# Patient Record
Sex: Male | Born: 1960 | Race: White | Hispanic: No | Smoking: Former smoker
Health system: Southern US, Community
[De-identification: ages and names within clinical notes are randomized; demographics above are authoritative.]

## PROBLEM LIST (undated history)

## (undated) DIAGNOSIS — J45909 Unspecified asthma, uncomplicated: Secondary | ICD-10-CM

## (undated) DIAGNOSIS — E785 Hyperlipidemia, unspecified: Secondary | ICD-10-CM

## (undated) DIAGNOSIS — I739 Peripheral vascular disease, unspecified: Secondary | ICD-10-CM

## (undated) DIAGNOSIS — C3490 Malignant neoplasm of unspecified part of unspecified bronchus or lung: Secondary | ICD-10-CM

## (undated) DIAGNOSIS — K219 Gastro-esophageal reflux disease without esophagitis: Secondary | ICD-10-CM

## (undated) DIAGNOSIS — J449 Chronic obstructive pulmonary disease, unspecified: Secondary | ICD-10-CM

## (undated) DIAGNOSIS — G40909 Epilepsy, unspecified, not intractable, without status epilepticus: Secondary | ICD-10-CM

## (undated) DIAGNOSIS — I10 Essential (primary) hypertension: Secondary | ICD-10-CM

## (undated) DIAGNOSIS — Z9889 Other specified postprocedural states: Secondary | ICD-10-CM

## (undated) HISTORY — DX: Chronic obstructive pulmonary disease, unspecified: J44.9

## (undated) HISTORY — DX: Epilepsy, unspecified, not intractable, without status epilepticus: G40.909

## (undated) HISTORY — DX: Unspecified asthma, uncomplicated: J45.909

---

## 2002-10-15 ENCOUNTER — Encounter: Admission: RE | Admit: 2002-10-15 | Discharge: 2002-10-15 | Payer: Self-pay | Admitting: Internal Medicine

## 2015-12-17 ENCOUNTER — Encounter (HOSPITAL_COMMUNITY): Payer: Self-pay | Admitting: Emergency Medicine

## 2015-12-17 ENCOUNTER — Emergency Department (HOSPITAL_COMMUNITY)
Admission: EM | Admit: 2015-12-17 | Discharge: 2015-12-17 | Discharge status: Against Medical Advice | Payer: Medicaid Other | Attending: Emergency Medicine | Admitting: Emergency Medicine

## 2015-12-17 DIAGNOSIS — M79602 Pain in left arm: Secondary | ICD-10-CM | POA: Diagnosis not present

## 2015-12-17 DIAGNOSIS — M25512 Pain in left shoulder: Secondary | ICD-10-CM | POA: Insufficient documentation

## 2015-12-17 DIAGNOSIS — I10 Essential (primary) hypertension: Secondary | ICD-10-CM | POA: Diagnosis not present

## 2015-12-17 DIAGNOSIS — M79601 Pain in right arm: Secondary | ICD-10-CM | POA: Diagnosis not present

## 2015-12-17 DIAGNOSIS — F17200 Nicotine dependence, unspecified, uncomplicated: Secondary | ICD-10-CM | POA: Diagnosis not present

## 2015-12-17 DIAGNOSIS — M25511 Pain in right shoulder: Secondary | ICD-10-CM | POA: Diagnosis not present

## 2015-12-17 HISTORY — DX: Essential (primary) hypertension: I10

## 2015-12-17 NOTE — ED Notes (Signed)
Pt. reports bilateral shoulders and both arm pain onset this evening , denies injury .

## 2021-08-18 ENCOUNTER — Other Ambulatory Visit: Payer: Self-pay

## 2021-08-18 ENCOUNTER — Emergency Department (HOSPITAL_COMMUNITY): Payer: Medicaid Other

## 2021-08-18 ENCOUNTER — Encounter (HOSPITAL_COMMUNITY): Payer: Self-pay

## 2021-08-18 ENCOUNTER — Inpatient Hospital Stay (HOSPITAL_COMMUNITY)
Admission: EM | Admit: 2021-08-18 | Discharge: 2021-08-21 | DRG: 065 | Disposition: A | Discharge status: Home Health | Payer: Medicaid Other | Attending: Internal Medicine | Admitting: Internal Medicine

## 2021-08-18 DIAGNOSIS — Z8673 Personal history of transient ischemic attack (TIA), and cerebral infarction without residual deficits: Secondary | ICD-10-CM | POA: Diagnosis present

## 2021-08-18 DIAGNOSIS — Z7982 Long term (current) use of aspirin: Secondary | ICD-10-CM

## 2021-08-18 DIAGNOSIS — I63512 Cerebral infarction due to unspecified occlusion or stenosis of left middle cerebral artery: Principal | ICD-10-CM | POA: Diagnosis present

## 2021-08-18 DIAGNOSIS — F17210 Nicotine dependence, cigarettes, uncomplicated: Secondary | ICD-10-CM | POA: Diagnosis present

## 2021-08-18 DIAGNOSIS — I110 Hypertensive heart disease with heart failure: Secondary | ICD-10-CM | POA: Diagnosis present

## 2021-08-18 DIAGNOSIS — I5042 Chronic combined systolic (congestive) and diastolic (congestive) heart failure: Secondary | ICD-10-CM | POA: Diagnosis present

## 2021-08-18 DIAGNOSIS — I6502 Occlusion and stenosis of left vertebral artery: Secondary | ICD-10-CM | POA: Diagnosis present

## 2021-08-18 DIAGNOSIS — R29704 NIHSS score 4: Secondary | ICD-10-CM | POA: Diagnosis present

## 2021-08-18 DIAGNOSIS — F17200 Nicotine dependence, unspecified, uncomplicated: Secondary | ICD-10-CM | POA: Diagnosis present

## 2021-08-18 DIAGNOSIS — R4701 Aphasia: Secondary | ICD-10-CM | POA: Diagnosis present

## 2021-08-18 DIAGNOSIS — R233 Spontaneous ecchymoses: Secondary | ICD-10-CM | POA: Diagnosis present

## 2021-08-18 DIAGNOSIS — I10 Essential (primary) hypertension: Secondary | ICD-10-CM | POA: Diagnosis present

## 2021-08-18 DIAGNOSIS — R270 Ataxia, unspecified: Secondary | ICD-10-CM | POA: Diagnosis present

## 2021-08-18 DIAGNOSIS — Z8249 Family history of ischemic heart disease and other diseases of the circulatory system: Secondary | ICD-10-CM

## 2021-08-18 DIAGNOSIS — Z28310 Unvaccinated for covid-19: Secondary | ICD-10-CM

## 2021-08-18 DIAGNOSIS — R471 Dysarthria and anarthria: Secondary | ICD-10-CM | POA: Diagnosis present

## 2021-08-18 DIAGNOSIS — G8321 Monoplegia of upper limb affecting right dominant side: Secondary | ICD-10-CM | POA: Diagnosis present

## 2021-08-18 DIAGNOSIS — I358 Other nonrheumatic aortic valve disorders: Secondary | ICD-10-CM | POA: Diagnosis present

## 2021-08-18 DIAGNOSIS — I639 Cerebral infarction, unspecified: Secondary | ICD-10-CM | POA: Diagnosis present

## 2021-08-18 DIAGNOSIS — Z20822 Contact with and (suspected) exposure to covid-19: Secondary | ICD-10-CM | POA: Diagnosis present

## 2021-08-18 LAB — DIFFERENTIAL
Abs Immature Granulocytes: 0.03 10*3/uL (ref 0.00–0.07)
Basophils Absolute: 0 10*3/uL (ref 0.0–0.1)
Basophils Relative: 1 %
Eosinophils Absolute: 0.1 10*3/uL (ref 0.0–0.5)
Eosinophils Relative: 2 %
Immature Granulocytes: 0 %
Lymphocytes Relative: 23 %
Lymphs Abs: 2.1 10*3/uL (ref 0.7–4.0)
Monocytes Absolute: 0.6 10*3/uL (ref 0.1–1.0)
Monocytes Relative: 7 %
Neutro Abs: 5.9 10*3/uL (ref 1.7–7.7)
Neutrophils Relative %: 67 %

## 2021-08-18 LAB — COMPREHENSIVE METABOLIC PANEL
ALT: 13 U/L (ref 0–44)
AST: 15 U/L (ref 15–41)
Albumin: 3.8 g/dL (ref 3.5–5.0)
Alkaline Phosphatase: 86 U/L (ref 38–126)
Anion gap: 12 (ref 5–15)
BUN: 13 mg/dL (ref 8–23)
CO2: 27 mmol/L (ref 22–32)
Calcium: 9.7 mg/dL (ref 8.9–10.3)
Chloride: 106 mmol/L (ref 98–111)
Creatinine, Ser: 1.03 mg/dL (ref 0.61–1.24)
GFR, Estimated: >60 mL/min (ref >60)
Glucose, Bld: 115 mg/dL — ABNORMAL HIGH (ref 70–99)
Potassium: 4.5 mmol/L (ref 3.5–5.1)
Sodium: 145 mmol/L (ref 135–145)
Total Bilirubin: 0.6 mg/dL (ref 0.3–1.2)
Total Protein: 7.4 g/dL (ref 6.5–8.1)

## 2021-08-18 LAB — RESP PANEL BY RT-PCR (FLU A&B, COVID) ARPGX2
Influenza A by PCR: NEGATIVE
Influenza B by PCR: NEGATIVE
SARS Coronavirus 2 by RT PCR: NEGATIVE

## 2021-08-18 LAB — APTT: aPTT: 31 seconds (ref 24–36)

## 2021-08-18 LAB — PROTIME-INR
INR: 1 (ref 0.8–1.2)
Prothrombin Time: 13 seconds (ref 11.4–15.2)

## 2021-08-18 LAB — CBC
HCT: 54.8 % — ABNORMAL HIGH (ref 39.0–52.0)
Hemoglobin: 18.2 g/dL — ABNORMAL HIGH (ref 13.0–17.0)
MCH: 30.8 pg (ref 26.0–34.0)
MCHC: 33.2 g/dL (ref 30.0–36.0)
MCV: 92.9 fL (ref 80.0–100.0)
Platelets: 250 10*3/uL (ref 150–400)
RBC: 5.9 MIL/uL — ABNORMAL HIGH (ref 4.22–5.81)
RDW: 14.9 % (ref 11.5–15.5)
WBC: 8.8 10*3/uL (ref 4.0–10.5)
nRBC: 0 % (ref 0.0–0.2)

## 2021-08-18 LAB — I-STAT CHEM 8, ED
BUN: 13 mg/dL (ref 8–23)
Calcium, Ion: 1.16 mmol/L (ref 1.15–1.40)
Chloride: 103 mmol/L (ref 98–111)
Creatinine, Ser: 1 mg/dL (ref 0.61–1.24)
Glucose, Bld: 118 mg/dL — ABNORMAL HIGH (ref 70–99)
HCT: 55 % — ABNORMAL HIGH (ref 39.0–52.0)
Hemoglobin: 18.7 g/dL — ABNORMAL HIGH (ref 13.0–17.0)
Potassium: 4.3 mmol/L (ref 3.5–5.1)
Sodium: 141 mmol/L (ref 135–145)
TCO2: 27 mmol/L (ref 22–32)

## 2021-08-18 LAB — ETHANOL: Alcohol, Ethyl (B): <10 mg/dL (ref <10)

## 2021-08-18 IMAGING — CT CT HEAD W/O CM
3 series · 15 of 47 positions shown, 18 images · non-contrast
Comparison: None.

CLINICAL DATA: Trouble speaking right-sided weakness

EXAM:
CT HEAD WITHOUT CONTRAST
TECHNIQUE: Contiguous axial images were obtained from the base of the skull
through the vertex without intravenous contrast.

[Series 3: head wo · axial · 0.47mm/px · z∈[-147,-2]mm · 9 of 35 slices shown, 12 images]
[im 3/35  brain]
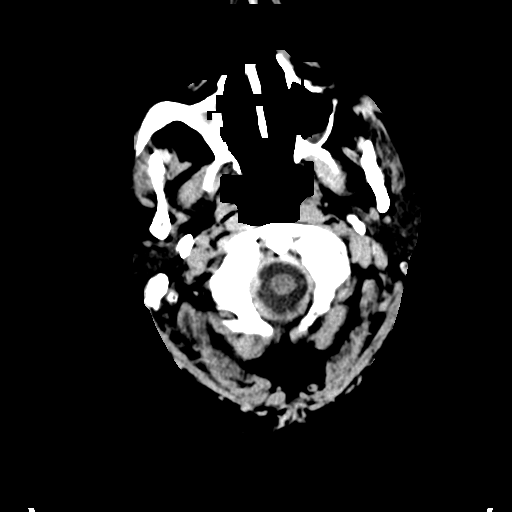
[im 3/35  bone]
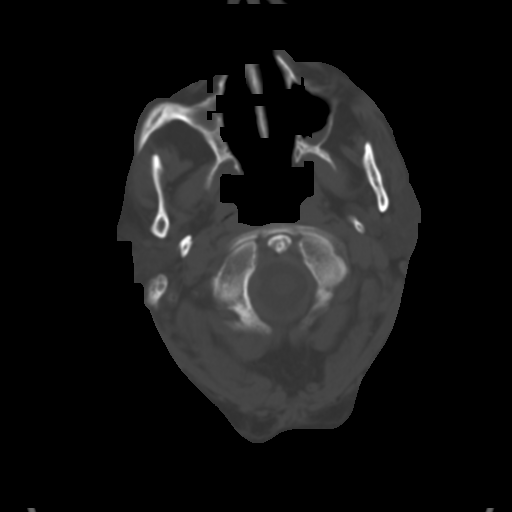
[im 6/35  brain]
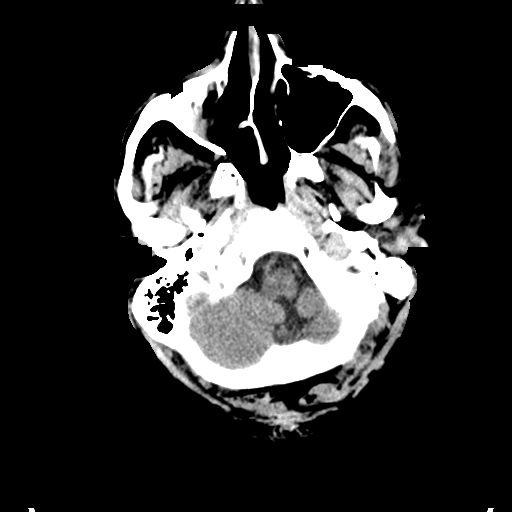
[im 10/35  brain]
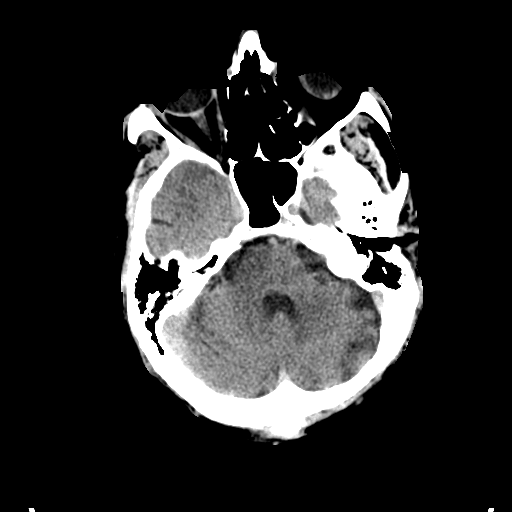
[im 13/35  brain]
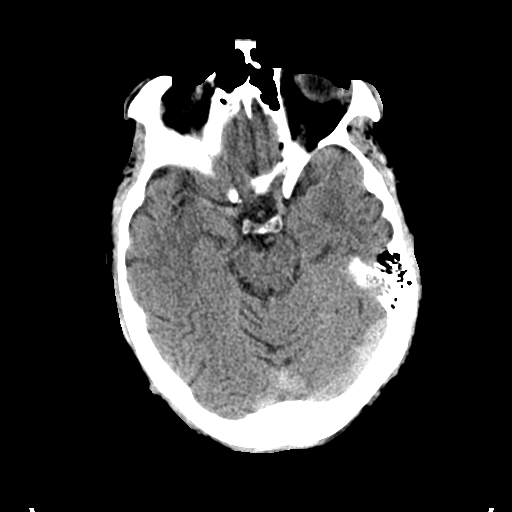
[im 18/35  brain]
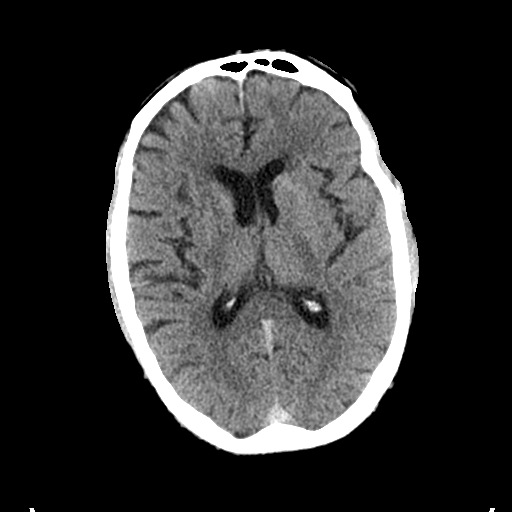
[im 18/35  bone]
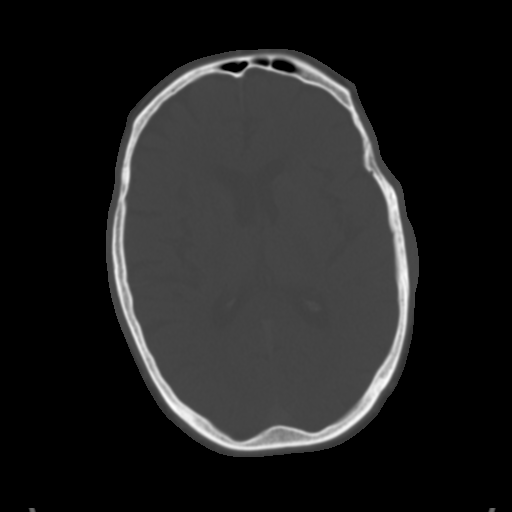
[im 22/35  brain]
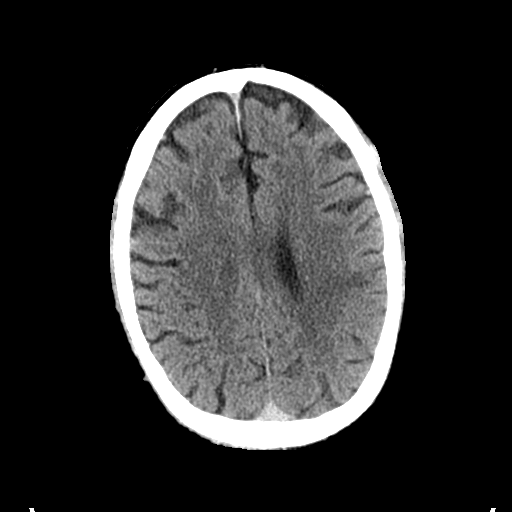
[im 25/35  brain]
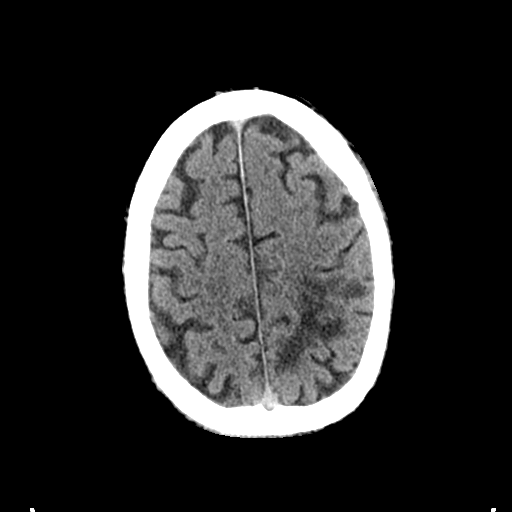
[im 29/35  brain]
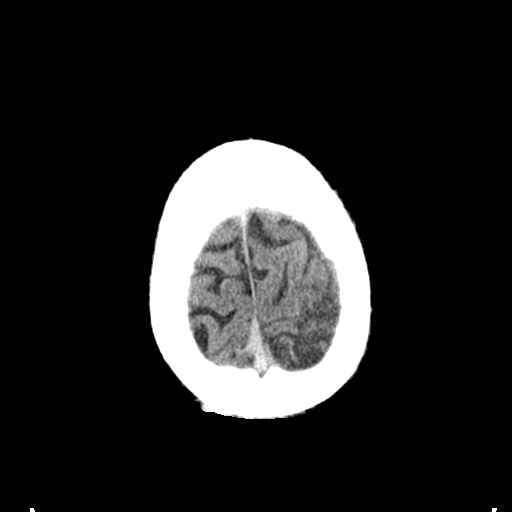
[im 32/35  brain]
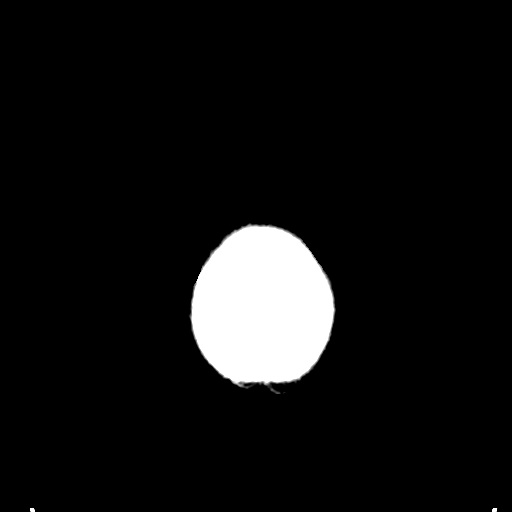
[im 32/35  bone]
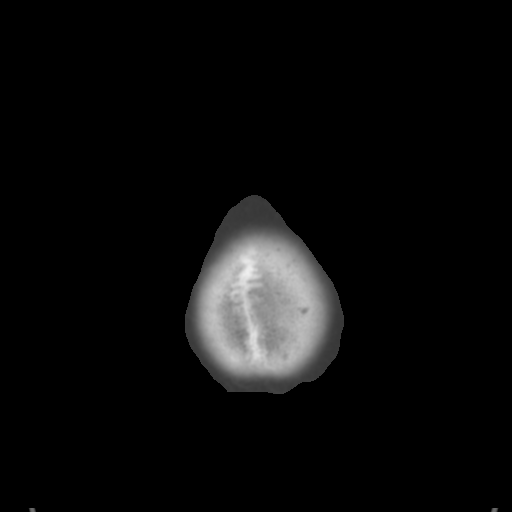

[Series 4: coronal soft tissue · coronal · 0.35mm/px · 3 of 83 slices shown]
[im 28/83  brain]
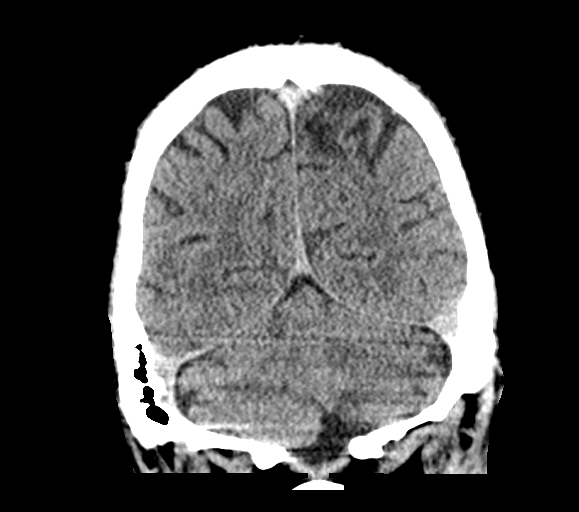
[im 37/83  brain]
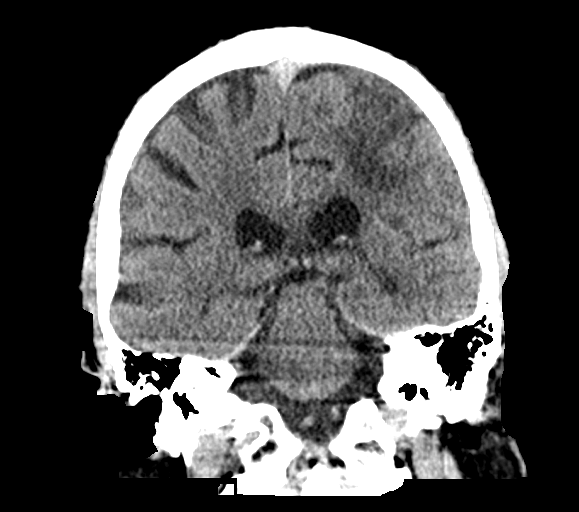
[im 46/83  brain]
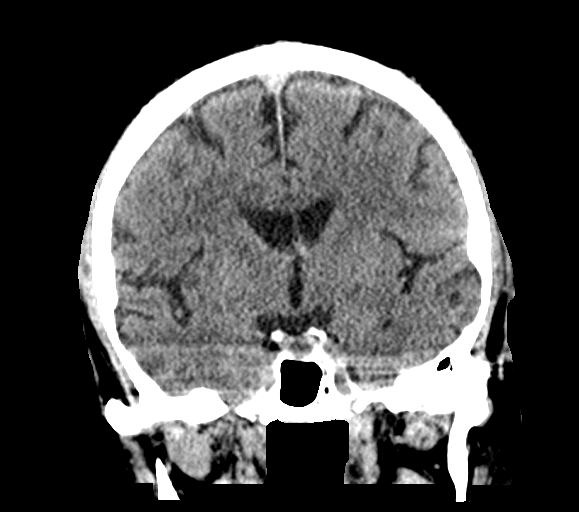

[Series 5: sagittal soft tissue · sagittal · 0.35mm/px · 3 of 68 slices shown]
[im 23/68  brain]
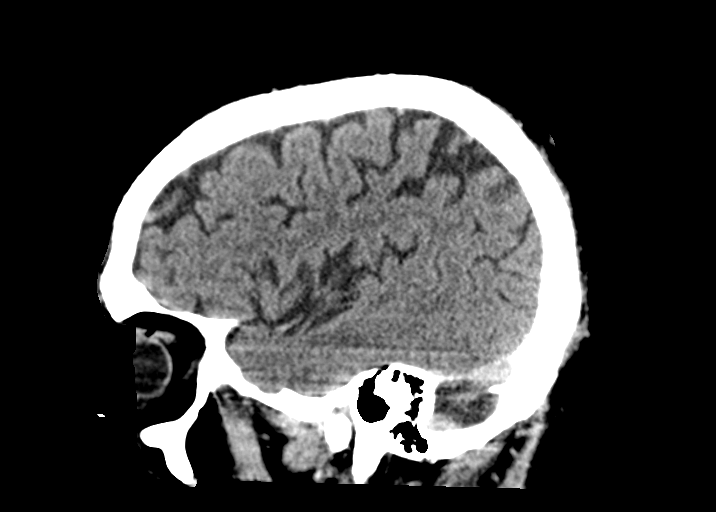
[im 34/68  brain]
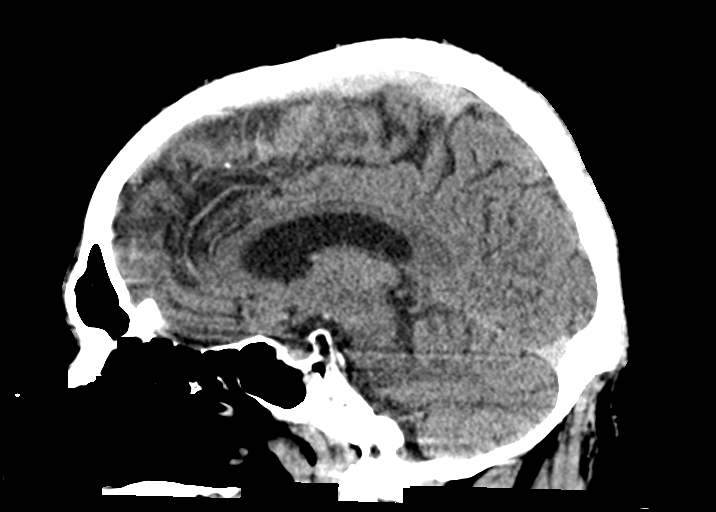
[im 45/68  brain]
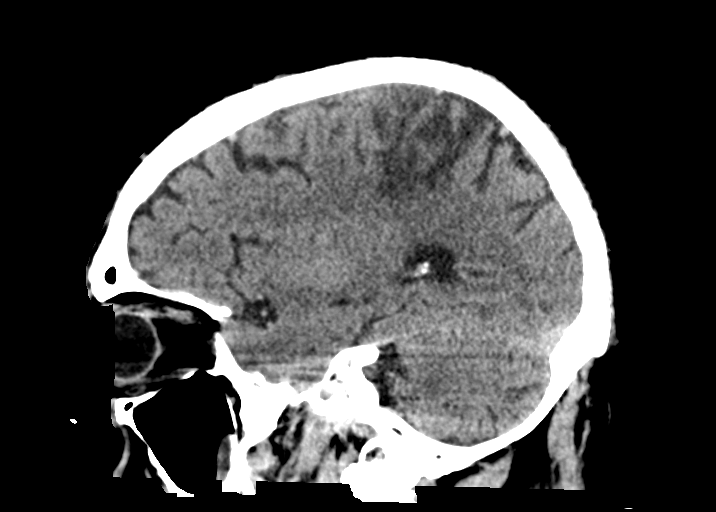

[15 of 47 positions shown; findings below may reference images not displayed]

FINDINGS: Brain: No hemorrhage or intracranial is visualized. Hypodensity
within the left parietal lobe and left cerebellum. Mild atrophy.
Patchy white matter hypodensity consistent with chronic small vessel
ischemic change.

Vascular: Prominent vessel at the anterior left lateral ventricle
could be related to developmental venous anomaly.

Skull: No fracture

Sinuses/Orbits: Small fluid in the left greater than right mastoid.
Opacified right maxillary sinus

Other: None
IMPRESSION: 1. Moderate hypodensity within the left parietal lobe with moderate
hypodensity in the left cerebellum, consistent with age
indeterminate infarcts; appearance suggests subacute to developing
chronic infarcts though given history, correlation with MRI should
be considered. No hemorrhage identified
2. Atrophy with mild chronic small vessel ischemic changes of the
white matter

## 2021-08-18 IMAGING — CT CT ANGIO NECK
2 of 7 series · 8 of 33 positions shown · IV contrast (omnipaque)
Comparison: None.

CLINICAL DATA: Right upper extremity weakness

EXAM:
CT ANGIOGRAPHY HEAD AND NECK
TECHNIQUE: Multidetector CT imaging of the head and neck was performed using
the standard protocol during bolus administration of intravenous
contrast. Multiplanar CT image reconstructions and MIPs were
obtained to evaluate the vascular anatomy. Carotid stenosis
measurements (when applicable) are obtained utilizing NASCET
criteria, using the distal internal carotid diameter as the
denominator.
CONTRAST:  75mL OMNIPAQUE IOHEXOL 350 MG/ML SOLN

[Series 6: cta head neck · axial · 0.61mm/px · z∈[-271,-139]mm · 2 of 200 slices shown]
[im 67/200  soft-tissue]
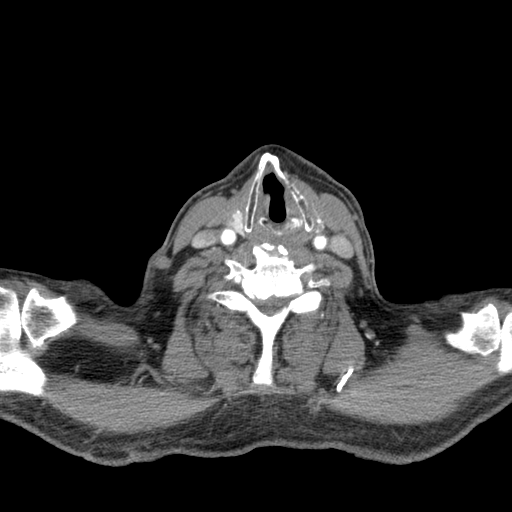
[im 133/200  soft-tissue]
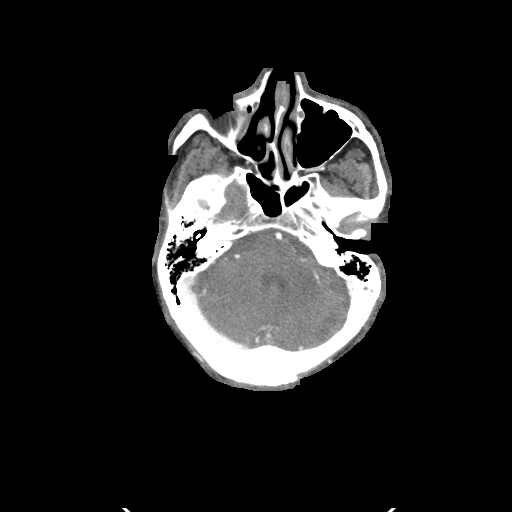

[Series 8: ax thin · axial · 0.39mm/px · z∈[-346,-62]mm · 6 of 396 slices shown]
[im 57/396  soft-tissue]
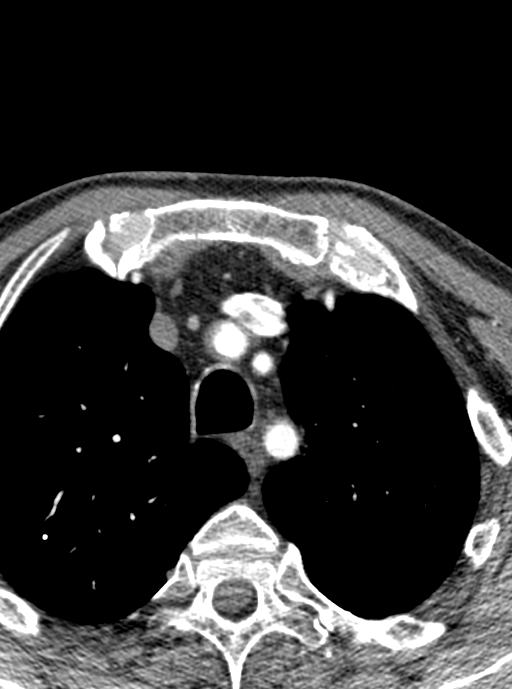
[im 113/396  bone]
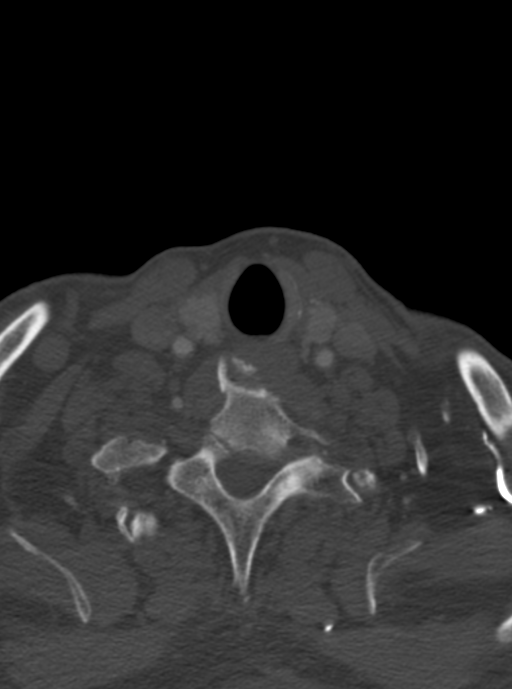
[im 170/396  soft-tissue]
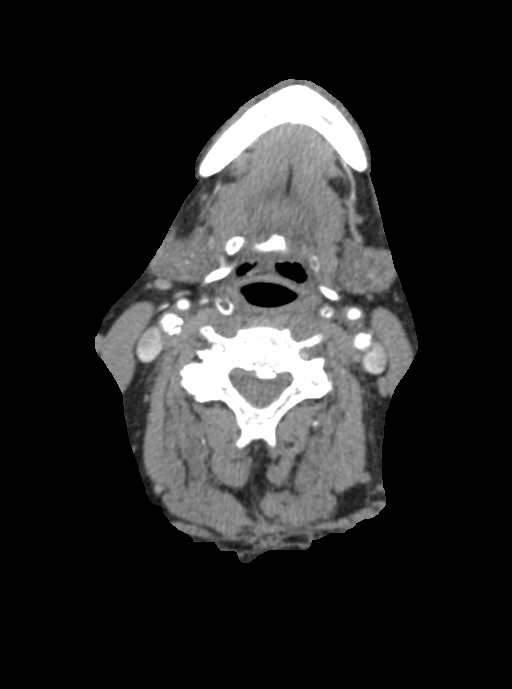
[im 226/396  bone]
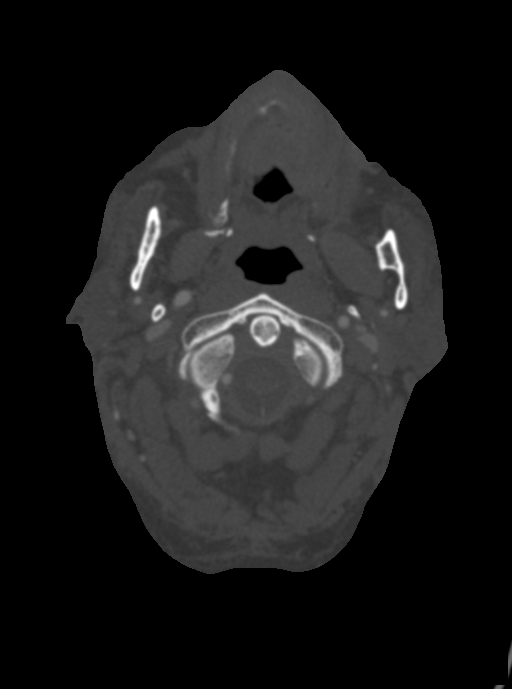
[im 283/396  soft-tissue]
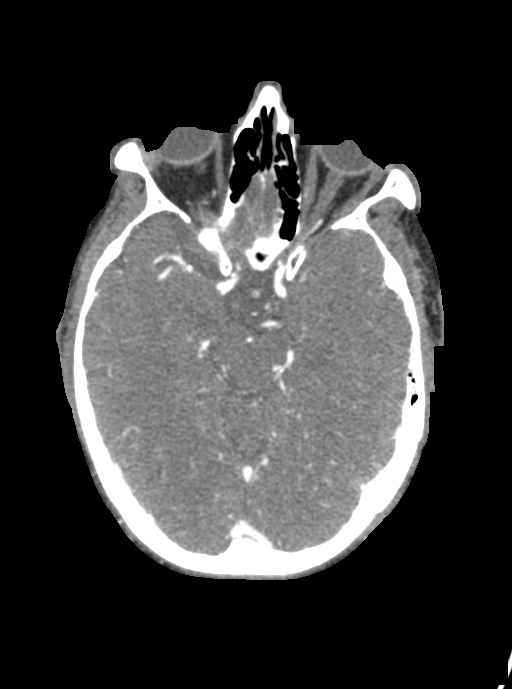
[im 339/396  bone]
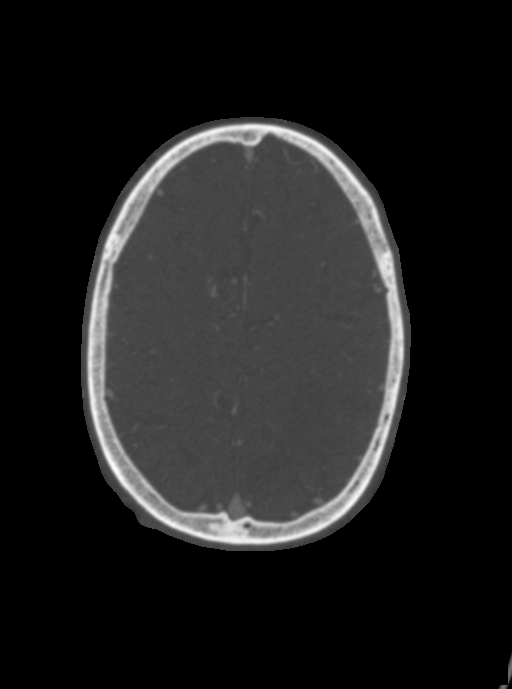

[8 of 33 positions shown; findings below may reference images not displayed]

FINDINGS: CTA NECK FINDINGS

SKELETON: There is no bony spinal canal stenosis. No lytic or
blastic lesion.

OTHER NECK: Normal pharynx, larynx and major salivary glands. No
cervical lymphadenopathy. Unremarkable thyroid gland.

UPPER CHEST: Emphysema

AORTIC ARCH:

There is calcific atherosclerosis of the aortic arch. There is no
aneurysm, dissection or hemodynamically significant stenosis of the
visualized portion of the aorta. Conventional 3 vessel aortic
branching pattern. The visualized proximal subclavian arteries are
widely patent.

RIGHT CAROTID SYSTEM: Normal without aneurysm, dissection or
stenosis.

LEFT CAROTID SYSTEM: Normal without aneurysm, dissection or
stenosis.

VERTEBRAL ARTERIES: Right dominant configuration. The left vertebral
artery is occluded along its entire length. Right vertebral artery
is normal.

CTA HEAD FINDINGS

POSTERIOR CIRCULATION:

--Vertebral arteries: Normal V4 segments.

--Inferior cerebellar arteries: Normal.

--Basilar artery: Normal.

--Superior cerebellar arteries: Normal.

--Posterior cerebral arteries (PCA): Normal.

ANTERIOR CIRCULATION:

--Intracranial internal carotid arteries: Low-density plaque within
the clinoid segment of the left ICA causing severe stenosis. There
is mild bilateral calcification.

--Anterior cerebral arteries (ACA): Normal. Both A1 segments are
present. Patent anterior communicating artery (a-comm).

--Middle cerebral arteries (MCA): Normal.

VENOUS SINUSES: As permitted by contrast timing, patent.

ANATOMIC VARIANTS: Large left periventricular developmental venous
anomaly that drains into the left internal cerebral vein.

Review of the MIP images confirms the above findings.
IMPRESSION: 1. Severe stenosis of the clinoid segment of the left internal
carotid artery due to low-density plaque. This could provide a
source of emboli into the left MCA.
2. Occlusion of the left vertebral artery along its entire length,
age indeterminate.
3. Large left periventricular developmental venous anomaly that
drains into the left internal cerebral vein.

Aortic Atherosclerosis ([0Z]-[0Z]) and Emphysema ([0Z]-[0Z]).

## 2021-08-18 IMAGING — MR MR HEAD W/O CM
11 series · 48 of 48 positions shown · non-contrast
Comparison: None.

CLINICAL DATA: Right upper extremity weakness with slurred speech

EXAM:
MRI HEAD WITHOUT CONTRAST
TECHNIQUE: Multiplanar, multiecho pulse sequences of the brain and surrounding
structures were obtained without intravenous contrast.

[Series 5: DWI · axial · 3.0mm · 1.36mm/px · z∈[-74,+63]mm · 7 of 104 slices shown (1 of 2)]
[im 1/104]
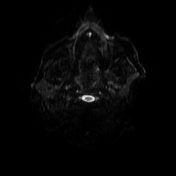
[im 18/104]
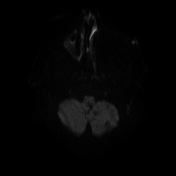
[im 35/104]
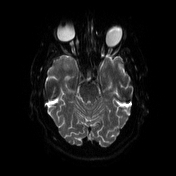
[im 52/104]
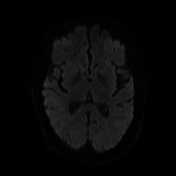
[im 69/104]
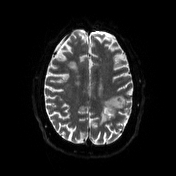
[im 86/104]
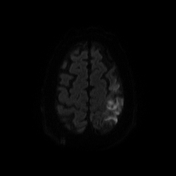
[im 104/104]
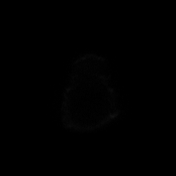

[Series 6: DWI · axial · 3.0mm · 1.36mm/px · z∈[-74,+63]mm · 4 of 52 slices shown (2 of 2)]
[im 1/52]
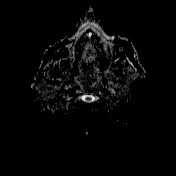
[im 18/52]
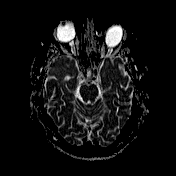
[im 35/52]
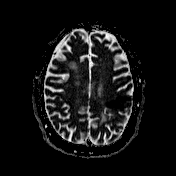
[im 52/52]
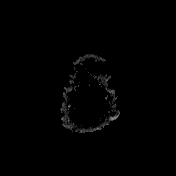

[Series 7: T1 · sagittal · 5.0mm · 0.75mm/px · 2 of 24 slices shown (1 of 2)]
[im 1/24]
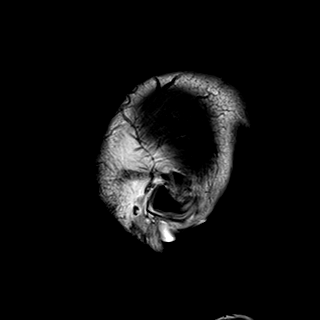
[im 24/24]
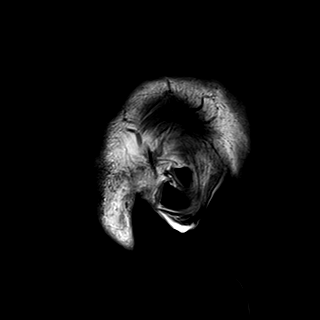

[Series 8: T2 · axial · 5.0mm · 0.62mm/px · z∈[-74,+72]mm · 2 of 26 slices shown (1 of 2)]
[im 1/26]
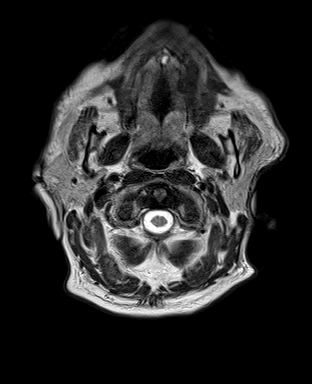
[im 26/26]
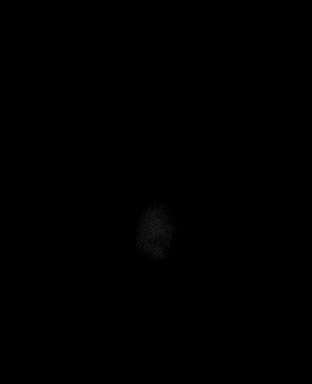

[Series 9: swi_images · axial · 3.0mm · 0.75mm/px · z∈[-75,+73]mm · 4 of 56 slices shown]
[im 1/56]
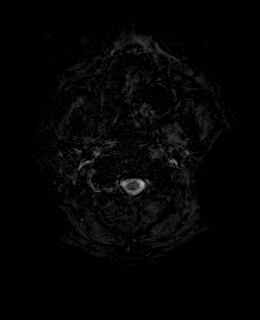
[im 19/56]
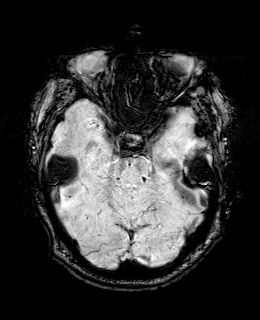
[im 37/56]
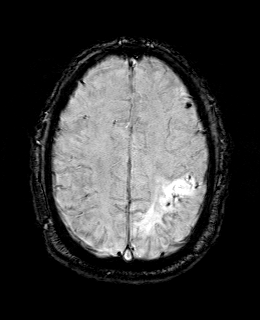
[im 56/56]
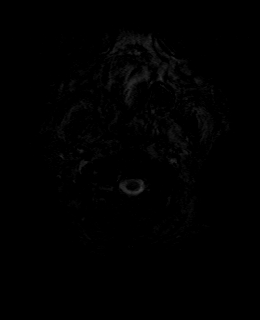

[Series 10: mip_images(sw) · axial · 24.0mm · 0.75mm/px · z∈[-66,+64]mm · 4 of 49 slices shown]
[im 1/49]
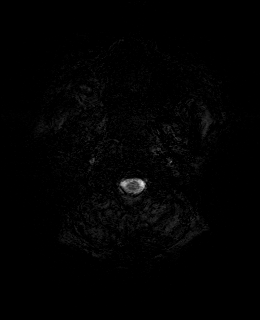
[im 17/49]
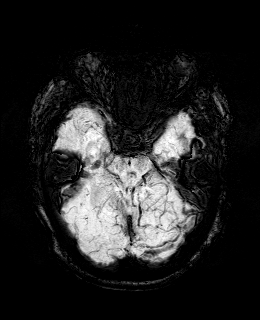
[im 33/49]
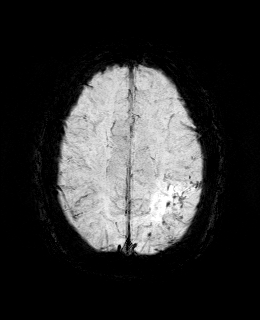
[im 49/49]
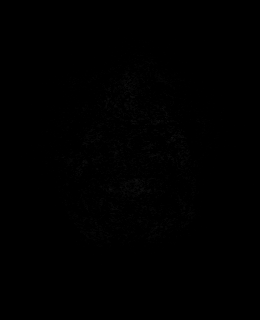

[Series 11: FLAIR · axial · 3.0mm · 0.75mm/px · z∈[-70,+68]mm · 4 of 52 slices shown]
[im 1/52]
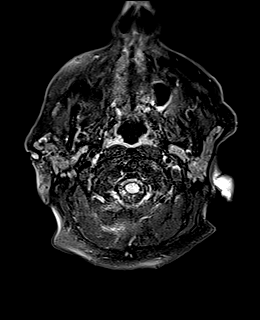
[im 18/52]
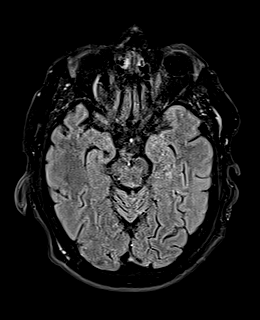
[im 35/52]
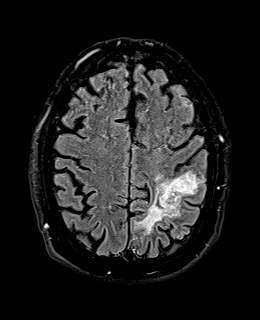
[im 52/52]
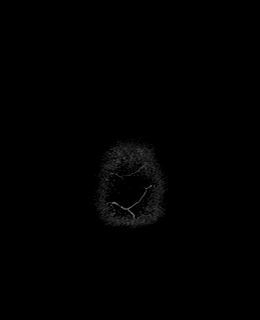

[Series 12: T1 · axial · 1.0mm · 0.94mm/px · z∈[-72,+71]mm · 12 of 160 slices shown (2 of 2)]
[im 1/160]
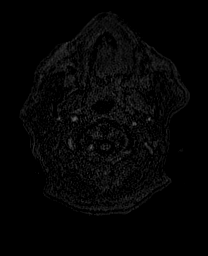
[im 15/160]
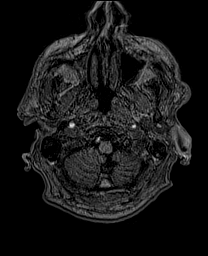
[im 29/160]
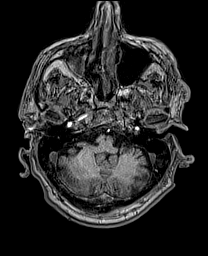
[im 44/160]
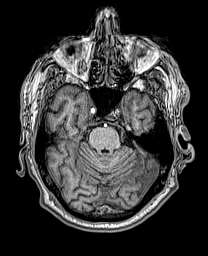
[im 58/160]
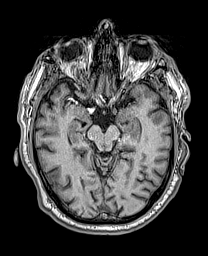
[im 73/160]
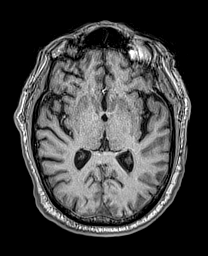
[im 87/160]
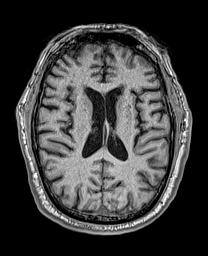
[im 102/160]
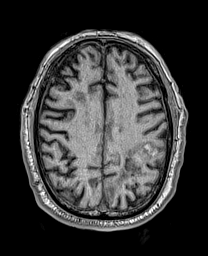
[im 116/160]
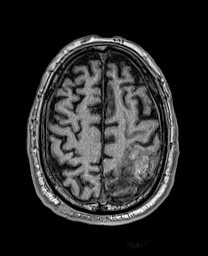
[im 131/160]
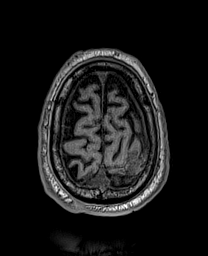
[im 145/160]
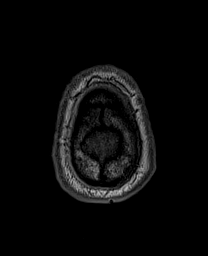
[im 160/160]
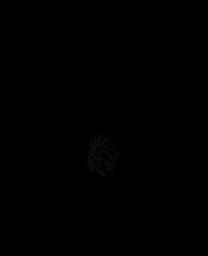

[Series 13: cor dwi_tracew · coronal · 5.0mm · 1.53mm/px · 4 of 54 slices shown]
[im 1/54]
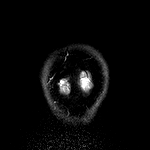
[im 18/54]
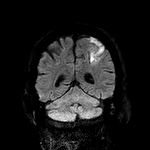
[im 36/54]
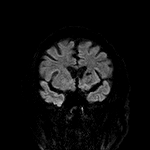
[im 54/54]
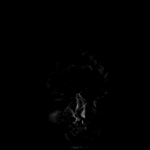

[Series 14: cor dwi_adc · coronal · 5.0mm · 1.53mm/px · 2 of 27 slices shown]
[im 1/27]
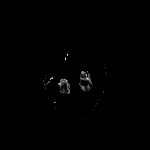
[im 27/27]
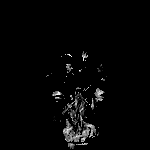

[Series 15: T2 · coronal · 5.0mm · 0.57mm/px · 3 of 34 slices shown (2 of 2)]
[im 1/34]
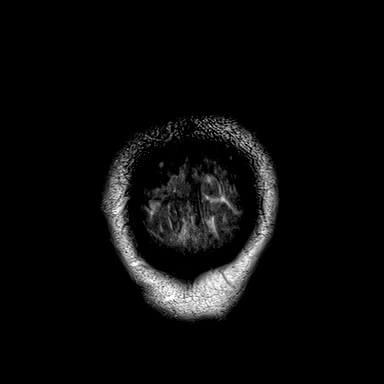
[im 17/34]
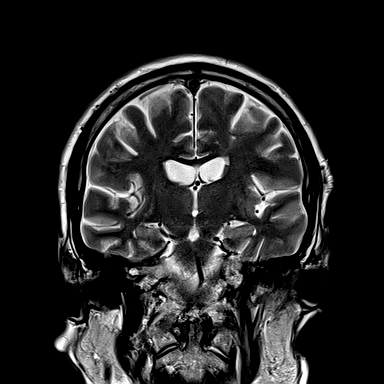
[im 34/34]
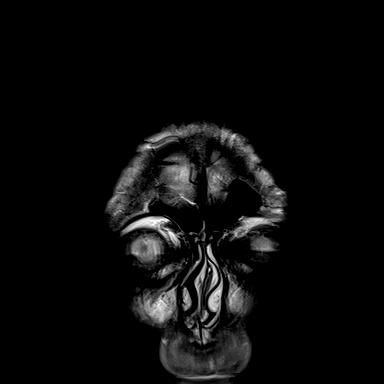

[48 of 48 positions shown; findings below may reference images not displayed]

FINDINGS: Brain: Large area of acute ischemia within the posterior left MCA
territory. More anteriorly, there is a region that appears to be
more subacute. There is petechial hemorrhage at the infarct site
with mild cytotoxic edema. There is multifocal hyperintense
T2-weighted signal within the white matter. Generalized volume loss
without a clear lobar predilection. The midline structures are
normal.

Vascular: Major flow voids are preserved.

Skull and upper cervical spine: Normal calvarium and skull base.
Visualized upper cervical spine and soft tissues are normal.

Sinuses/Orbits:Right maxillary sinus filling with bilateral mastoid
effusions. Normal orbits.
IMPRESSION: 1. Large area of mixed acute/subacute ischemia within the left MCA
territory.
2. Petechial hemorrhage at the infarct site with mild cytotoxic
edema. Heidelberg classification 1a: HI1, scattered small petechiae,
no mass effect.

## 2021-08-18 IMAGING — CT CT ANGIO HEAD
3 of 6 series · 9 of 33 positions shown · IV contrast (omnipaque)
Comparison: None.

CLINICAL DATA: Right upper extremity weakness

EXAM:
CT ANGIOGRAPHY HEAD AND NECK
TECHNIQUE: Multidetector CT imaging of the head and neck was performed using
the standard protocol during bolus administration of intravenous
contrast. Multiplanar CT image reconstructions and MIPs were
obtained to evaluate the vascular anatomy. Carotid stenosis
measurements (when applicable) are obtained utilizing NASCET
criteria, using the distal internal carotid diameter as the
denominator.
CONTRAST:  75mL OMNIPAQUE IOHEXOL 350 MG/ML SOLN

[Series 505: cta head neck · axial · 0.61mm/px · z∈[-271,-139]mm · 2 of 200 slices shown]
[im 67/200  soft-tissue]
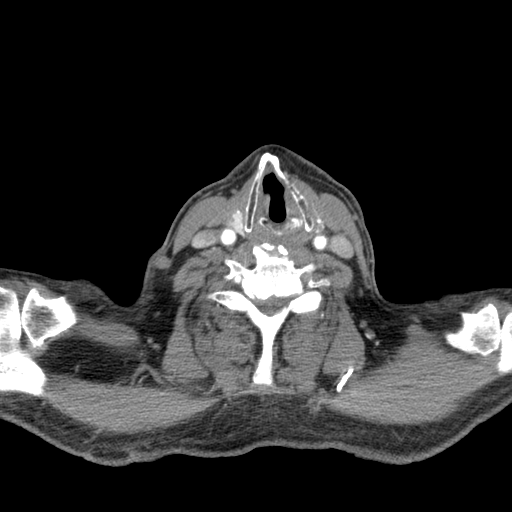
[im 133/200  soft-tissue]
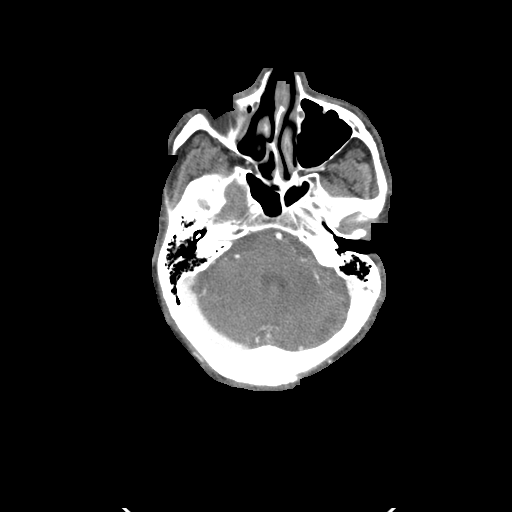

[Series 507: ax thin · axial · 0.39mm/px · z∈[-346,-62]mm · 6 of 396 slices shown]
[im 57/396  soft-tissue]
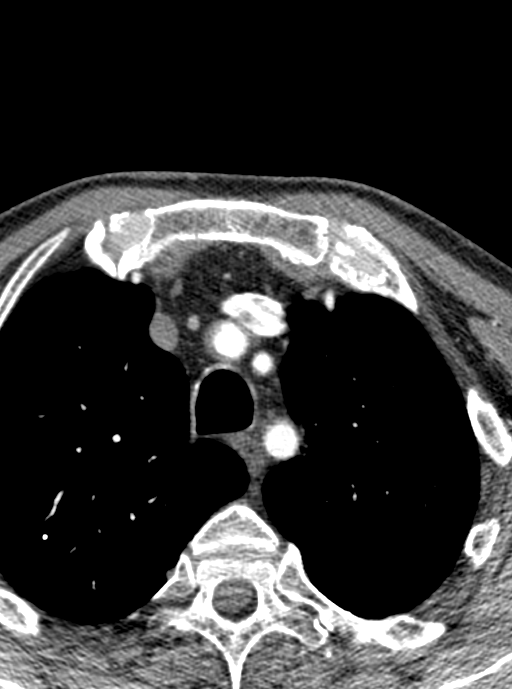
[im 113/396  bone]
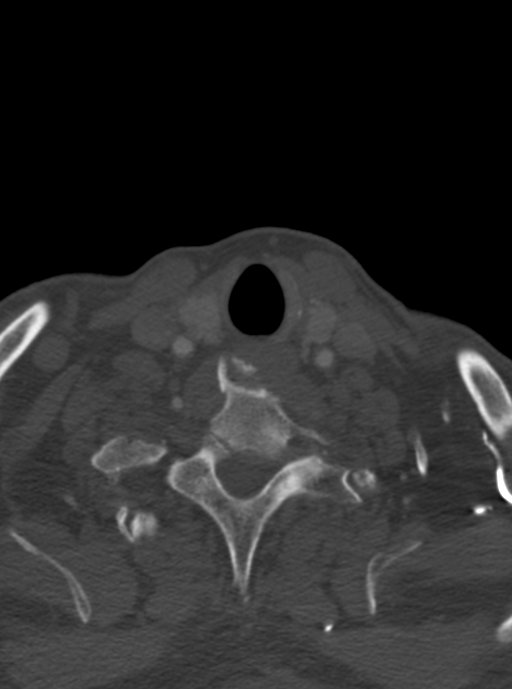
[im 170/396  soft-tissue]
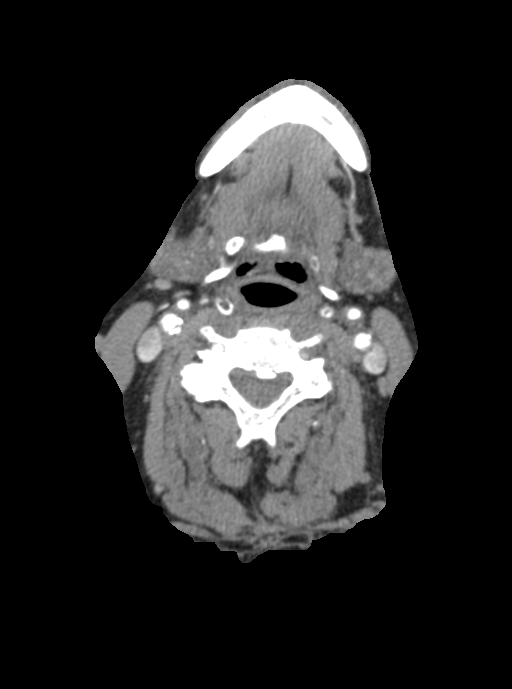
[im 226/396  bone]
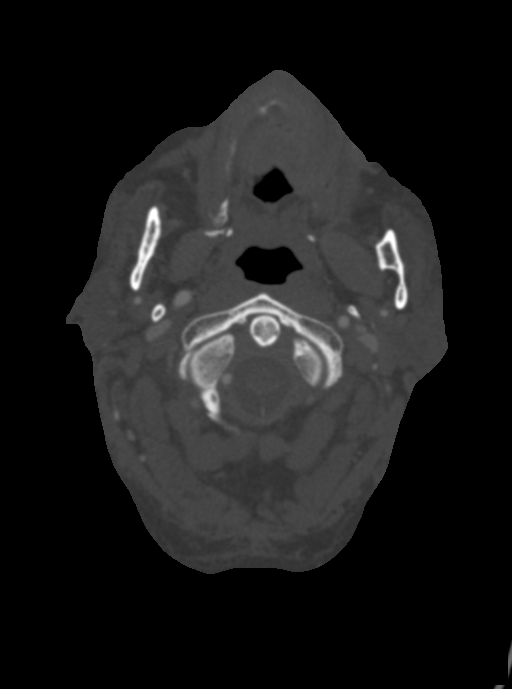
[im 283/396  soft-tissue]
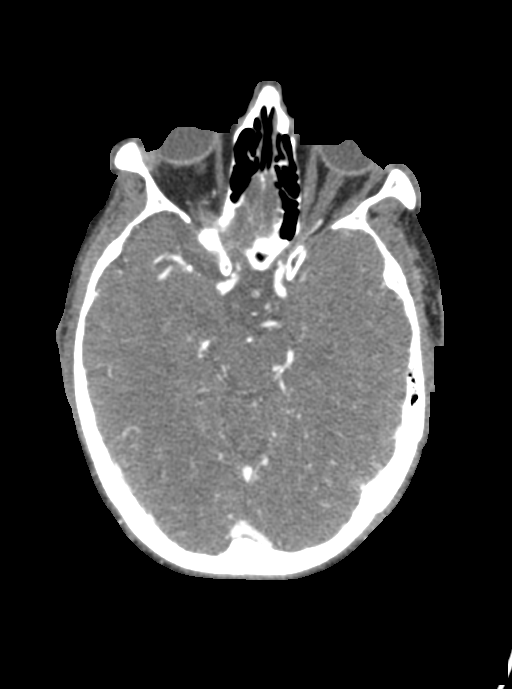
[im 339/396  bone]
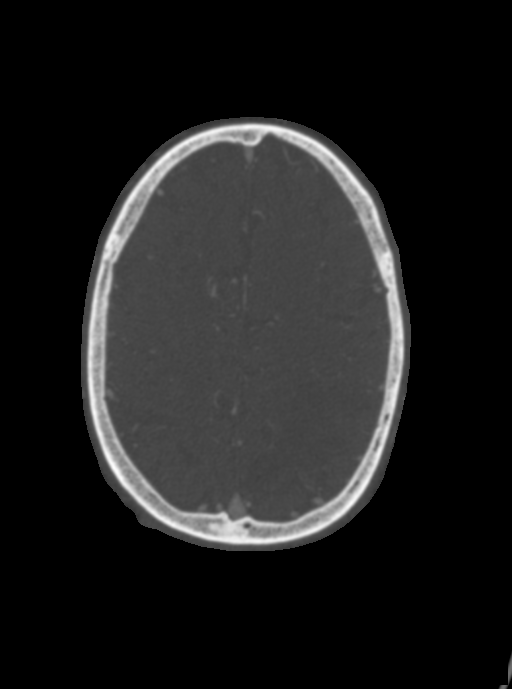

[Series 512: sagittal thick · sagittal · 0.56mm/px · 1 of 44 slices shown]
[im 22/44  soft-tissue]
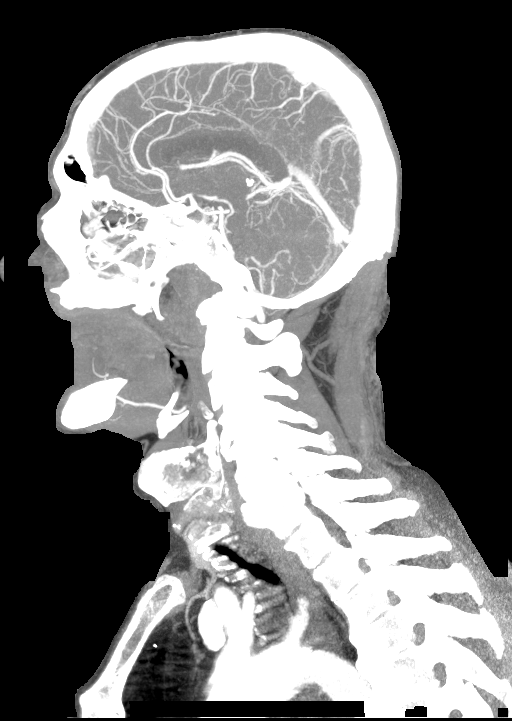

[9 of 33 positions shown; findings below may reference images not displayed]

FINDINGS: CTA NECK FINDINGS

SKELETON: There is no bony spinal canal stenosis. No lytic or
blastic lesion.

OTHER NECK: Normal pharynx, larynx and major salivary glands. No
cervical lymphadenopathy. Unremarkable thyroid gland.

UPPER CHEST: Emphysema

AORTIC ARCH:

There is calcific atherosclerosis of the aortic arch. There is no
aneurysm, dissection or hemodynamically significant stenosis of the
visualized portion of the aorta. Conventional 3 vessel aortic
branching pattern. The visualized proximal subclavian arteries are
widely patent.

RIGHT CAROTID SYSTEM: Normal without aneurysm, dissection or
stenosis.

LEFT CAROTID SYSTEM: Normal without aneurysm, dissection or
stenosis.

VERTEBRAL ARTERIES: Right dominant configuration. The left vertebral
artery is occluded along its entire length. Right vertebral artery
is normal.

CTA HEAD FINDINGS

POSTERIOR CIRCULATION:

--Vertebral arteries: Normal V4 segments.

--Inferior cerebellar arteries: Normal.

--Basilar artery: Normal.

--Superior cerebellar arteries: Normal.

--Posterior cerebral arteries (PCA): Normal.

ANTERIOR CIRCULATION:

--Intracranial internal carotid arteries: Low-density plaque within
the clinoid segment of the left ICA causing severe stenosis. There
is mild bilateral calcification.

--Anterior cerebral arteries (ACA): Normal. Both A1 segments are
present. Patent anterior communicating artery (a-comm).

--Middle cerebral arteries (MCA): Normal.

VENOUS SINUSES: As permitted by contrast timing, patent.

ANATOMIC VARIANTS: Large left periventricular developmental venous
anomaly that drains into the left internal cerebral vein.

Review of the MIP images confirms the above findings.
IMPRESSION: 1. Severe stenosis of the clinoid segment of the left internal
carotid artery due to low-density plaque. This could provide a
source of emboli into the left MCA.
2. Occlusion of the left vertebral artery along its entire length,
age indeterminate.
3. Large left periventricular developmental venous anomaly that
drains into the left internal cerebral vein.

Aortic Atherosclerosis ([0Z]-[0Z]) and Emphysema ([0Z]-[0Z]).

## 2021-08-18 MED ORDER — LABETALOL HCL 5 MG/ML IV SOLN
10.0000 mg | Freq: Once | INTRAVENOUS | Status: AC
  Administered 2021-08-18: 10 mg via INTRAVENOUS
  Filled 2021-08-18: qty 4

## 2021-08-18 MED ORDER — NICOTINE 21 MG/24HR TD PT24
21.0000 mg | MEDICATED_PATCH | Freq: Every day | TRANSDERMAL | Status: DC
  Administered 2021-08-19 – 2021-08-21 (×3): 21 mg via TRANSDERMAL
  Filled 2021-08-18 (×3): qty 1

## 2021-08-18 MED ORDER — IOHEXOL 350 MG/ML SOLN
75.0000 mL | Freq: Once | INTRAVENOUS | Status: AC | PRN
  Administered 2021-08-18: 75 mL via INTRAVENOUS

## 2021-08-18 NOTE — ED Provider Notes (Signed)
[facility] [facility] DEPT Provider Note   CSN: [id number] Arrival date & time: 08/18/21  1803     History Chief Complaint  Patient presents with   Weakness    Todd Estrada is a 60 y.o. male.  HPI  60 year old male with past medical history of HTN presents the emergency department concern for weakness in the right upper extremity and speech changes.  Patient states that the weakness in the right upper extremity has been ongoing for the past 2 weeks.  Today a little before 2 PM approximately 4-1/2 hours ago he noticed that his speech was changing.  He describes it as a stuttering and slurred speech.  Denies any aphasia.  Patient has never had symptoms like this before.  Denies any headache, vision change, facial droop.  Past Medical History:  Diagnosis Date   Hypertension     There are no problems to display for this patient.   History reviewed. No pertinent surgical history.     History reviewed. No pertinent family history.  Social History   Tobacco Use   Smoking status: Every Day  Substance Use Topics   Alcohol use: Yes   Drug use: No    Home Medications Prior to Admission medications   Not on File    Allergies    Patient has no known allergies.  Review of Systems   Review of Systems  Constitutional:  Negative for chills and fever.  HENT:  Negative for congestion.   Eyes:  Negative for visual disturbance.  Respiratory:  Negative for shortness of breath.   Cardiovascular:  Negative for chest pain.  Gastrointestinal:  Negative for abdominal pain, diarrhea and vomiting.  Genitourinary:  Negative for dysuria.  Skin:  Negative for rash.  Neurological:  Positive for speech difficulty and weakness. Negative for dizziness, tremors, facial asymmetry, light-headedness and headaches.   Physical Exam Updated Vital Signs BP 133/74   Pulse (!) 103   Temp 99.1 F (37.3 C) (Oral)   Resp (!) 21   SpO2 91%   Physical Exam Vitals and  nursing note reviewed.  Constitutional:      Appearance: Normal appearance.  HENT:     Head: Normocephalic.     Mouth/Throat:     Mouth: Mucous membranes are moist.  Eyes:     Comments: Disconjugate gaze with the right eye tracking laterally which the patient states is baseline, equal pupils  Cardiovascular:     Rate and Rhythm: Normal rate.  Pulmonary:     Effort: Pulmonary effort is normal. No respiratory distress.  Abdominal:     Palpations: Abdomen is soft.     Tenderness: There is no abdominal tenderness.  Musculoskeletal:     Cervical back: No rigidity.  Skin:    General: Skin is warm.  Neurological:     Mental Status: He is alert and oriented to person, place, and time.     Motor: Weakness present.     Comments: Drift noted in the right upper extremity with ataxia, dysarthric speech, NIH of 3  Psychiatric:        Mood and Affect: Mood normal.    ED Results / Procedures / Treatments   Labs (all labs ordered are listed, but only abnormal results are displayed) Labs Reviewed  CBC - Abnormal; Notable for the following components:      Result Value   RBC 5.90 (*)    Hemoglobin 18.2 (*)    HCT 54.8 (*)    All other  components within normal limits  I-STAT CHEM 8, ED - Abnormal; Notable for the following components:   Glucose, Bld 118 (*)    Hemoglobin 18.7 (*)    HCT 55.0 (*)    All other components within normal limits  RESP PANEL BY RT-PCR (FLU A&B, COVID) ARPGX2  ETHANOL  PROTIME-INR  APTT  DIFFERENTIAL  COMPREHENSIVE METABOLIC PANEL  RAPID URINE DRUG SCREEN, HOSP PERFORMED  URINALYSIS, ROUTINE W REFLEX MICROSCOPIC    EKG EKG Interpretation  Date/Time:  Wednesday August 18 2021 18:13:41 EDT Ventricular Rate:  114 PR Interval:  128 QRS Duration: 94 QT Interval:  320 QTC Calculation: 441 R Axis:   77 Text Interpretation: Sinus tachycardia Anterior infarct, old Baseline wander in lead(s) V2 Sinus tachycardia Confirmed by Lavenia Atlas [phone]) on  08/18/2021 7:32:37 PM  Radiology CT HEAD WO CONTRAST (5MM)  Result Date: 08/18/2021 CLINICAL DATA:  Trouble speaking right-sided weakness EXAM: CT HEAD WITHOUT CONTRAST TECHNIQUE: Contiguous axial images were obtained from the base of the skull through the vertex without intravenous contrast. COMPARISON:  None. FINDINGS: Brain: No hemorrhage or intracranial is visualized. Hypodensity within the left parietal lobe and left cerebellum. Mild atrophy. Patchy white matter hypodensity consistent with chronic small vessel ischemic change. Vascular: Prominent vessel at the anterior left lateral ventricle could be related to developmental venous anomaly. Skull: No fracture Sinuses/Orbits: Small fluid in the left greater than right mastoid. Opacified right maxillary sinus Other: None IMPRESSION: 1. Moderate hypodensity within the left parietal lobe with moderate hypodensity in the left cerebellum, consistent with age indeterminate infarcts; appearance suggests subacute to developing chronic infarcts though given history, correlation with MRI should be considered. No hemorrhage identified 2. Atrophy with mild chronic small vessel ischemic changes of the white matter Electronically Signed   By: Donavan Foil M.D.   On: 08/18/2021 19:21    Procedures .Critical Care  Date/Time: 08/18/2021 9:50 PM Performed by: Lorelle Gibbs, DO Authorized by: Lorelle Gibbs, DO   Critical care provider statement:    Critical care time (minutes):  45   Critical care time was exclusive of:  Separately billable procedures and treating other patients   Critical care was necessary to treat or prevent imminent or life-threatening deterioration of the following conditions:  CNS failure or compromise   Critical care was time spent personally by me on the following activities:  Discussions with consultants, evaluation of patient's response to treatment, examination of patient, ordering and performing treatments and interventions,  ordering and review of laboratory studies, ordering and review of radiographic studies, pulse oximetry, re-evaluation of patient's condition, obtaining history from patient or surrogate and review of old charts   I assumed direction of critical care for this patient from another provider in my specialty: no     Care discussed with: admitting provider     Medications Ordered in ED Medications - No data to display  ED Course  I have reviewed the triage vital signs and the nursing notes.  Pertinent labs & imaging results that were available during my care of the patient were reviewed by me and considered in my medical decision making (see chart for details).    MDM Rules/Calculators/A&P                           60 year old male presents emergency department right upper extremity weakness and change in speech.  Right upper extremity symptoms have been ongoing for 2 weeks, speech changes have been  ongoing for over 4-1/2 hours.  Outside of the tPA window/stroke page window.  Tachycardic on arrival but slightly anxious.  NIH of 3 for drift in the right upper extremity with ataxia and dysarthric speech.  CT and MRI are positive for acute versus subacute strokes in the left MCA territory with some converted hemorrhage.  No edema or mass-effect.  On reevaluation patient's NIH is closer to 4 with speech that has become more aphasic.  Plan for neurology consult and admit.  Spoke with teleneurology who is reviewed the MRI.  They recommend CTA of the head and neck.  Given the duration of symptoms no indication for emergent transfer to [name] or intervention plan for medical admission.  Patients evaluation and results requires admission for further treatment and care. Patient agrees with admission plan, offers no new complaints and is stable/unchanged at time of admit.  Final Clinical Impression(s) / ED Diagnoses Final diagnoses:  None    Rx / DC Orders ED Discharge Orders     None         Lorelle Gibbs, DO 08/18/21 2150

## 2021-08-18 NOTE — ED Notes (Signed)
Pt back from MRI 

## 2021-08-18 NOTE — ED Notes (Signed)
Call received from pt cousin Benson Porcaro [phone] requesting rtn call for pt status/updates. [employer]

## 2021-08-18 NOTE — ED Provider Notes (Addendum)
I was called to come evaluate the patient in triage by the midlevel for concern of strokelike symptoms.  Patient reports that he has had right upper extremity weakness for the past 2 weeks and earlier today just before 2 PM developed what he describes as stuttering/slurred speech. He is ataxic in the right upper extremity with slight drift.  He has lateral gaze of the right eye which he states is baseline.  Able to answer questions, speech dysarthric and at times confused but not aphasic. Denies any vision changes, aphasia.  He does not appear to be neglecting.  The right upper extremity weakness has been going on for 2 weeks and the speech changes have been going on for greater than 4 hours, do not think he is appropriate for stroke page, he is not VAN positive.  Asked charge nurse to get the patient an emergent bed for work-up, plan for stroke work-up in the department.      Lorelle Gibbs, DO 08/18/21 0684

## 2021-08-18 NOTE — ED Triage Notes (Signed)
Pt having trouble speaking, pt having weakness to right side. Ride side visibly weaker than left side. Pt states this is not normal for him. Dr. Raliegh Ip. [place], [place] speaking to patient now.

## 2021-08-18 NOTE — ED Notes (Signed)
Patient transported to CT 

## 2021-08-18 NOTE — ED Notes (Signed)
Pt is in MRI  

## 2021-08-18 NOTE — Consult Note (Signed)
[facility] Tele[facility]urology Consult Services  Stat Consult  Date of Service:   08/18/2021 20:52:37  Diagnosis:       I63.9 - Cerebrovascular accident (CVA), unspecified mechanism ([place])  Impression 60 yo RH M with h/o HTN per chart, presenting with at least moderate-sized L MCA area stroke with petechial hemorrhage that started 2 weeks ago with RUE weakness and today developed new difficulty getting words out. Concerning for recurrent L MCA area strokes and recommend vessel imaging to evaluate L ICA and MCA for fixed stenotic lesion, in addition to admission for remainder of stroke workup. Stroke is moderate sized and is mostly 83 weeks old at this point, low concern for swelling requiring surgical intervention, ok to remain here for workup. Ok from my perspective to start ASA 81 despite some petechial hemorrhage as symptoms and initial stroke 16 weeks old at this point. Discussed with ED Dr. Dina Rich by phone.  Our recommendations are outlined below.  Diagnostic Studies: Routine CTA head and neck visualization of vasculature Transthoracic Echo with bubble study, if available  Laboratory Studies: Recommend Lipid panel Hemoglobin A1c  Medication: Initiate Aspirin 81 mg daily  Nursing Recommendations: Telemetry, IV Fluids, avoid dextrose containing fluids, Maintain euglycemia Neuro checks q4 hrs x 24 hrs and then per shift Head of bed 30 degrees  Consultations: Recommend Speech therapy if failed dysphagia screen Physical therapy/Occupational therapy  DVT Prophylaxis: Choice of Primary Team  Disposition: Neurology will follow  Additional Recommendations: BP goal normotensive if vessel imaging is unremarkable with no fixed lesion; SBP < 180 if significant L ICA or MCA stenosis   Metrics: [facility] Notification Time: 08/18/2021 20:49:43 Stamp Time: 08/18/2021 20:52:37 Callback Response Time: 08/18/2021  20:53:41   ----------------------------------------------------------------------------------------------------  Chief Complaint: L MCA area stroke  History of Present Illness: Patient is a 60 year old Male.  60 yo RH M with h/o HTN per chart, presenting with L MCA area stroke.  2 weeks ago he had sudden onset of RUE weakness. Has been the same since.  Today also developed new difficulty speaking. Feels it's hard to get his words out.  No headaches.      Antiplatelet use: Yes ASA 325    Examination: BP(133/74), Pulse(103), Blood Glucose(118) 1A: Level of Consciousness - Alert; keenly responsive + 0 1B: Ask Month and Age - Both Questions Right + 0 1C: Blink Eyes & Squeeze Hands - Performs 1 Task + 1 2: Test Horizontal Extraocular Movements - Normal + 0 3: Test Visual Fields - No Visual Loss + 0 4: Test Facial Palsy (Use Grimace if Obtunded) - Normal symmetry + 0 5A: Test Left Arm Motor Drift - No Drift for 10 Seconds + 0 5B: Test Right Arm Motor Drift - Drift, but doesn't hit bed + 1 6A: Test Left Leg Motor Drift - No Drift for 5 Seconds + 0 6B: Test Right Leg Motor Drift - No Drift for 5 Seconds + 0 7: Test Limb Ataxia (FNF/Heel-Shin) - No Ataxia + 0 8: Test Sensation - Normal; No sensory loss + 0 9: Test Language/Aphasia - Mild-Moderate Aphasia: Some Obvious Changes, Without Significant Limitation + 1 10: Test Dysarthria - Mild-Moderate Dysarthria: Slurring but can be understood + 1 11: Test Extinction/Inattention - No abnormality + 0  NIHSS Score: 4 NIHSS Free Text :  Doesn't know the month  R am unable to pronate, R hand unable to move fingers     Patient / Family was informed the Neurology Consult would occur via TeleHealth consult by  way of interactive audio and video telecommunications and consented to receiving care in this manner.  Patient is being evaluated for possible acute neurologic impairment and high probability of imminent or life - threatening  deterioration.I spent total of 25 minutes providing care to this patient, including time for face to face visit via telemedicine, review of medical records, imaging studies and discussion of findings with providers, the patient and / or family.   Dr Raymond Gurney   [facility] [phone]  Case [id number]

## 2021-08-18 NOTE — ED Notes (Signed)
Pt doing neuro consult currently with assistance from RN

## 2021-08-19 ENCOUNTER — Inpatient Hospital Stay (HOSPITAL_COMMUNITY): Payer: Medicaid Other

## 2021-08-19 ENCOUNTER — Encounter (HOSPITAL_COMMUNITY): Payer: Self-pay | Admitting: Family Medicine

## 2021-08-19 DIAGNOSIS — Z8249 Family history of ischemic heart disease and other diseases of the circulatory system: Secondary | ICD-10-CM | POA: Diagnosis not present

## 2021-08-19 DIAGNOSIS — G8321 Monoplegia of upper limb affecting right dominant side: Secondary | ICD-10-CM | POA: Diagnosis present

## 2021-08-19 DIAGNOSIS — I10 Essential (primary) hypertension: Secondary | ICD-10-CM | POA: Diagnosis present

## 2021-08-19 DIAGNOSIS — I639 Cerebral infarction, unspecified: Secondary | ICD-10-CM | POA: Diagnosis present

## 2021-08-19 DIAGNOSIS — R471 Dysarthria and anarthria: Secondary | ICD-10-CM | POA: Diagnosis present

## 2021-08-19 DIAGNOSIS — F17210 Nicotine dependence, cigarettes, uncomplicated: Secondary | ICD-10-CM | POA: Diagnosis present

## 2021-08-19 DIAGNOSIS — Z28310 Unvaccinated for covid-19: Secondary | ICD-10-CM | POA: Diagnosis not present

## 2021-08-19 DIAGNOSIS — F17200 Nicotine dependence, unspecified, uncomplicated: Secondary | ICD-10-CM | POA: Diagnosis not present

## 2021-08-19 DIAGNOSIS — I358 Other nonrheumatic aortic valve disorders: Secondary | ICD-10-CM | POA: Diagnosis present

## 2021-08-19 DIAGNOSIS — I6502 Occlusion and stenosis of left vertebral artery: Secondary | ICD-10-CM | POA: Diagnosis present

## 2021-08-19 DIAGNOSIS — Z7982 Long term (current) use of aspirin: Secondary | ICD-10-CM | POA: Diagnosis not present

## 2021-08-19 DIAGNOSIS — R4701 Aphasia: Secondary | ICD-10-CM | POA: Diagnosis present

## 2021-08-19 DIAGNOSIS — I5042 Chronic combined systolic (congestive) and diastolic (congestive) heart failure: Secondary | ICD-10-CM | POA: Diagnosis present

## 2021-08-19 DIAGNOSIS — R270 Ataxia, unspecified: Secondary | ICD-10-CM | POA: Diagnosis present

## 2021-08-19 DIAGNOSIS — R233 Spontaneous ecchymoses: Secondary | ICD-10-CM | POA: Diagnosis present

## 2021-08-19 DIAGNOSIS — R29704 NIHSS score 4: Secondary | ICD-10-CM | POA: Diagnosis present

## 2021-08-19 DIAGNOSIS — I6389 Other cerebral infarction: Secondary | ICD-10-CM

## 2021-08-19 DIAGNOSIS — I63512 Cerebral infarction due to unspecified occlusion or stenosis of left middle cerebral artery: Secondary | ICD-10-CM | POA: Diagnosis present

## 2021-08-19 DIAGNOSIS — I110 Hypertensive heart disease with heart failure: Secondary | ICD-10-CM | POA: Diagnosis present

## 2021-08-19 DIAGNOSIS — Z20822 Contact with and (suspected) exposure to covid-19: Secondary | ICD-10-CM | POA: Diagnosis present

## 2021-08-19 HISTORY — DX: Essential (primary) hypertension: I10

## 2021-08-19 LAB — CBC
HCT: 53.5 % — ABNORMAL HIGH (ref 39.0–52.0)
Hemoglobin: 17.6 g/dL — ABNORMAL HIGH (ref 13.0–17.0)
MCH: 30.7 pg (ref 26.0–34.0)
MCHC: 32.9 g/dL (ref 30.0–36.0)
MCV: 93.2 fL (ref 80.0–100.0)
Platelets: 192 10*3/uL (ref 150–400)
RBC: 5.74 MIL/uL (ref 4.22–5.81)
RDW: 14.8 % (ref 11.5–15.5)
WBC: 8.2 10*3/uL (ref 4.0–10.5)
nRBC: 0 % (ref 0.0–0.2)

## 2021-08-19 LAB — RAPID URINE DRUG SCREEN, HOSP PERFORMED
Amphetamines: NOT DETECTED
Barbiturates: NOT DETECTED
Benzodiazepines: NOT DETECTED
Cocaine: NOT DETECTED
Opiates: NOT DETECTED
Tetrahydrocannabinol: NOT DETECTED

## 2021-08-19 LAB — COMPREHENSIVE METABOLIC PANEL
ALT: 12 U/L (ref 0–44)
AST: 26 U/L (ref 15–41)
Albumin: 3.7 g/dL (ref 3.5–5.0)
Alkaline Phosphatase: 70 U/L (ref 38–126)
Anion gap: 10 (ref 5–15)
BUN: 7 mg/dL — ABNORMAL LOW (ref 8–23)
CO2: 24 mmol/L (ref 22–32)
Calcium: 9.2 mg/dL (ref 8.9–10.3)
Chloride: 104 mmol/L (ref 98–111)
Creatinine, Ser: 0.63 mg/dL (ref 0.61–1.24)
GFR, Estimated: >60 mL/min (ref >60)
Glucose, Bld: 115 mg/dL — ABNORMAL HIGH (ref 70–99)
Potassium: 4.6 mmol/L (ref 3.5–5.1)
Sodium: 138 mmol/L (ref 135–145)
Total Bilirubin: 1.6 mg/dL — ABNORMAL HIGH (ref 0.3–1.2)
Total Protein: 7.4 g/dL (ref 6.5–8.1)

## 2021-08-19 LAB — HEMOGLOBIN A1C
Hgb A1c MFr Bld: 5.5 % (ref 4.8–5.6)
Mean Plasma Glucose: 111.15 mg/dL

## 2021-08-19 LAB — URINALYSIS, ROUTINE W REFLEX MICROSCOPIC
Glucose, UA: NEGATIVE mg/dL
Hgb urine dipstick: NEGATIVE
Ketones, ur: NEGATIVE mg/dL
Leukocytes,Ua: NEGATIVE
Nitrite: NEGATIVE
Protein, ur: 100 mg/dL — AB
Specific Gravity, Urine: 1.02 (ref 1.005–1.030)
pH: 6 (ref 5.0–8.0)

## 2021-08-19 LAB — LIPID PANEL
Cholesterol: 172 mg/dL (ref 0–200)
HDL: 35 mg/dL — ABNORMAL LOW (ref >40)
LDL Cholesterol: 120 mg/dL — ABNORMAL HIGH (ref 0–99)
Total CHOL/HDL Ratio: 4.9 RATIO
Triglycerides: 85 mg/dL (ref <150)
VLDL: 17 mg/dL (ref 0–40)

## 2021-08-19 LAB — ECHOCARDIOGRAM COMPLETE
Area-P 1/2: 6.71 cm2
Height: 72 in
S' Lateral: 4.9 cm
Weight: 3120 oz

## 2021-08-19 LAB — HIV ANTIBODY (ROUTINE TESTING W REFLEX): HIV Screen 4th Generation wRfx: NONREACTIVE

## 2021-08-19 LAB — CBG MONITORING, ED
Glucose-Capillary: 108 mg/dL — ABNORMAL HIGH (ref 70–99)
Glucose-Capillary: 119 mg/dL — ABNORMAL HIGH (ref 70–99)

## 2021-08-19 MED ORDER — DEXTROSE-NACL 5-0.45 % IV SOLN: INTRAVENOUS | Status: AC

## 2021-08-19 MED ORDER — ACETAMINOPHEN 160 MG/5ML PO SOLN: 650.0000 mg | ORAL | Status: DC | PRN

## 2021-08-19 MED ORDER — IPRATROPIUM-ALBUTEROL 0.5-2.5 (3) MG/3ML IN SOLN: 3.0000 mL | RESPIRATORY_TRACT | Status: DC | PRN

## 2021-08-19 MED ORDER — PERFLUTREN LIPID MICROSPHERE
1.0000 mL | INTRAVENOUS | Status: AC | PRN
  Administered 2021-08-19: 2 mL via INTRAVENOUS

## 2021-08-19 MED ORDER — ASPIRIN EC 81 MG PO TBEC
81.0000 mg | DELAYED_RELEASE_TABLET | Freq: Every day | ORAL | Status: DC
  Administered 2021-08-19 – 2021-08-21 (×3): 81 mg via ORAL
  Filled 2021-08-19 (×3): qty 1

## 2021-08-19 MED ORDER — SENNOSIDES-DOCUSATE SODIUM 8.6-50 MG PO TABS: 1.0000 | ORAL_TABLET | Freq: Every evening | ORAL | Status: DC | PRN

## 2021-08-19 MED ORDER — ACETAMINOPHEN 325 MG PO TABS: 650.0000 mg | ORAL_TABLET | ORAL | Status: DC | PRN

## 2021-08-19 MED ORDER — SODIUM CHLORIDE 0.9 % IV SOLN: INTRAVENOUS | Status: DC

## 2021-08-19 MED ORDER — HYDRALAZINE HCL 20 MG/ML IJ SOLN: 10.0000 mg | Freq: Four times a day (QID) | INTRAMUSCULAR | Status: DC | PRN

## 2021-08-19 MED ORDER — STROKE: EARLY STAGES OF RECOVERY BOOK
Freq: Once | Status: AC
  Filled 2021-08-19: qty 1

## 2021-08-19 MED ORDER — ACETAMINOPHEN 650 MG RE SUPP: 650.0000 mg | RECTAL | Status: DC | PRN

## 2021-08-19 NOTE — Progress Notes (Signed)
  Echocardiogram 2D Echocardiogram has been performed.  Todd Estrada 08/19/2021, 2:58 PM

## 2021-08-19 NOTE — ED Notes (Signed)
PT at bedside.

## 2021-08-19 NOTE — H&P (Signed)
He is                                                                             TRH H&P    Patient Demographics:    Todd Estrada, is a 60 y.o. male  MRN: [id number]  DOB - 01-22-1961  Admit Date - 08/18/2021  Referring MD/NP/PA: Dina Rich  Outpatient Primary MD for the patient is Pcp, No  Patient coming from: Home  Chief complaint- right upper extremity weakness   HPI:    Todd Estrada  is a 60 y.o. male, with history of hypertension and tobacco abuse presents ED with a chief complaint of right upper extremity weakness and dysarthria.  Patient's dysarthria progressed well in the ED to near aphasia.  By the time that I see him he is only able to answer yes or no questions or give one-word answers.  Chart review reveals that he had been having right upper extremity weakness for 2 weeks.  Then on 31 August just before 2 PM he developed slurred speech.  Upon presentation to the ED he did have right upper extremity drift, dysarthric speech, and lateral gaze of the right eye-Baseline.  When asked why patient came in today he replies "stroke."  With yes or no questions he is able to state that he is not currently in any pain.  He has not been taking any antihypertensives although he does have a known history of hypertension.  He smokes a pack a day.  He denies any headaches, change in vision, change in hearing, facial droop, drooling, difficulty swallowing.  He reports for 2 weeks he has had right arm numbness, although it feels like it is paralyzed.  His hand is completely weak, but his arm still has some strength.  He reports has had a decreased appetite.  No nausea no vomiting, no abdominal pain.  Patient also denies shortness of breath and chest pain.  He drinks approximately once per month.  He is not vaccinated for COVID.  He does not know of any sick contacts.  In the ED Temp 99.1, heart rate 93-116, respiratory rate 18-23, blood pressure 162/109, satting 96% No leukocytosis with a white blood cell  count of 8.8, hemoglobin 8.2, platelets 250 Chemistry panel is mostly unremarkable CT head shows moderate hypodensity within the left parietal lobe with moderate hypodensity in the left cerebellum consistent with age-indeterminate infarcts MRI brain shows mixed acute/subacute ischemia within the left MCA territory, petechial hemorrhage at the infarct side with mild cytotoxic edema. Neurology was consulted.  They recommended routine CTA head and neck, echo with bubble study, lipid panel and hemoglobin A1c, start aspirin 81 mg daily, neurochecks every 4 hours, ST/OT/PT, BP goal normotensive if vessel imaging is unremarkable with no fixed lesion    Review of systems:    In addition to the HPI above,  No Fever-chills, No Headache, No changes with Vision or hearing, No problems swallowing food or Liquids, No Chest pain, Cough or Shortness of Breath, No Abdominal pain, No Nausea or Vomiting, bowel movements are regular, No Blood in stool or Urine, No dysuria, No new skin rashes or bruises, No new joints pains-aches,  No recent weight gain or loss, No  polyuria, polydypsia or polyphagia, No significant Mental Stressors.  All other systems reviewed and are negative.    Past History of the following :    Past Medical History:  Diagnosis Date   Hypertension       History reviewed. No pertinent surgical history.    Social History:      Social History   Tobacco Use   Smoking status: Every Day   Smokeless tobacco: Not on file  Substance Use Topics   Alcohol use: Yes       Family History :    History reviewed. No pertinent family history. Family history hypertension   Home Medications:   Prior to Admission medications   Medication Sig Start Date End Date Taking? Authorizing Provider  aspirin 325 MG EC tablet Take 325 mg by mouth daily.   Yes [provider]     Allergies:    No Known Allergies   Physical Exam:   Vitals  Blood pressure (!) 145/91, pulse  72, temperature 99.1 F (37.3 C), temperature source Oral, resp. rate 20, SpO2 91 %.  1.  General: Patient lying supine in bed,  no acute distress   2. Psychiatric: Alert and oriented x 3, mood and behavior normal for situation, pleasant and cooperative with exam   3. Neurologic: face is symmetric, moves all 4 extremities voluntarily, 2 out of 5 grip strength in the right hand, 3 out of 5 strength in the right arm, normal strength in the left arm, and right lower extremity, difficulty with finger-nose, speaking in 1 word sentences speech has become increasingly difficult   4. HEENMT:  Head is atraumatic, normocephalic, pupils reactive to light, neck is supple, trachea is midline, mucous membranes are moist   5. Respiratory : Lungs are clear to auscultation bilaterally without wheezing, rhonchi, rales, no cyanosis, no increase in work of breathing or accessory muscle use   6. Cardiovascular : Heart rate normal, rhythm is regular, no murmurs, rubs or gallops, no peripheral edema, peripheral pulses palpated   7. Gastrointestinal:  Abdomen is soft, nondistended, nontender to palpation bowel sounds active, no masses or organomegaly palpated   8. Skin:  Skin is warm, dry and intact without rashes, acute lesions, or ulcers on limited exam   9.Musculoskeletal:  No acute deformities or trauma, no asymmetry in tone, no peripheral edema, peripheral pulses palpated, no tenderness to palpation in the extremities     Data Review:    CBC Recent Labs  Lab 08/18/21 1818 08/18/21 1827  WBC 8.8  --   HGB 18.2* 18.7*  HCT 54.8* 55.0*  PLT 250  --   MCV 92.9  --   MCH 30.8  --   MCHC 33.2  --   RDW 14.9  --   LYMPHSABS 2.1  --   MONOABS 0.6  --   EOSABS 0.1  --   BASOSABS 0.0  --    ------------------------------------------------------------------------------------------------------------------  Results for orders placed or performed during the hospital encounter of 08/18/21 (from  the past 48 hour(s))  Ethanol     Status: None   Collection Time: 08/18/21  6:18 PM  Result Value Ref Range   Alcohol, Ethyl (B) <10 <10 mg/dL    Comment: (NOTE) Lowest detectable limit for serum alcohol is 10 mg/dL.  For medical purposes only. Performed at [facility], [place] [address] [place], [place] [address]   Protime-INR     Status: None   Collection Time: 08/18/21  6:18 PM  Result  Value Ref Range   Prothrombin Time 13.0 11.4 - 15.2 seconds   INR 1.0 0.8 - 1.2    Comment: (NOTE) INR goal varies based on device and disease states. Performed at [facility], [place] [address], [place], [place] [address]   APTT     Status: None   Collection Time: 08/18/21  6:18 PM  Result Value Ref Range   aPTT 31 24 - 36 seconds    Comment: Performed at [facility], [place] [address] [place], [place] [address]  CBC     Status: Abnormal   Collection Time: 08/18/21  6:18 PM  Result Value Ref Range   WBC 8.8 4.0 - 10.5 K/uL   RBC 5.90 (H) 4.22 - 5.81 MIL/uL   Hemoglobin 18.2 (H) 13.0 - 17.0 g/dL   HCT 54.8 (H) 39.0 - 52.0 %   MCV 92.9 80.0 - 100.0 fL   MCH 30.8 26.0 - 34.0 pg   MCHC 33.2 30.0 - 36.0 g/dL   RDW 14.9 11.5 - 15.5 %   Platelets 250 150 - 400 K/uL   nRBC 0.0 0.0 - 0.2 %    Comment: Performed at [facility], [place] [address], [place], [place] [address]  Differential     Status: None   Collection Time: 08/18/21  6:18 PM  Result Value Ref Range   Neutrophils Relative % 67 %   Neutro Abs 5.9 1.7 - 7.7 K/uL   Lymphocytes Relative 23 %   Lymphs Abs 2.1 0.7 - 4.0 K/uL   Monocytes Relative 7 %   Monocytes Absolute 0.6 0.1 - 1.0 K/uL   Eosinophils Relative 2 %   Eosinophils Absolute 0.1 0.0 - 0.5 K/uL   Basophils Relative 1 %   Basophils Absolute 0.0 0.0 - 0.1 K/uL   Immature Granulocytes 0 %   Abs Immature Granulocytes 0.03 0.00 - 0.07 K/uL    Comment: Performed at [facility], [place] [address] [place], [place] [address]  Comprehensive metabolic panel     Status: Abnormal   Collection Time: 08/18/21  6:18 PM  Result Value Ref Range   Sodium 145 135 - 145 mmol/L   Potassium 4.5 3.5 - 5.1 mmol/L   Chloride 106 98 - 111 mmol/L   CO2 27 22 - 32 mmol/L   Glucose, Bld 115 (H) 70 - 99 mg/dL    Comment: Glucose reference range applies only to samples taken after fasting for at least 8 hours.   BUN 13 8 - 23 mg/dL   Creatinine, Ser 1.03 0.61 - 1.24 mg/dL   Calcium 9.7 8.9 - 10.3 mg/dL   Total Protein 7.4 6.5 - 8.1 g/dL   Albumin 3.8 3.5 - 5.0 g/dL   AST 15 15 - 41 U/L   ALT 13 0 - 44 U/L   Alkaline Phosphatase 86 38 - 126 U/L   Total Bilirubin 0.6 0.3 - 1.2 mg/dL   GFR, Estimated >60 >60 mL/min    Comment: (NOTE) Calculated using the CKD-EPI Creatinine Equation (2021)    Anion gap 12 5 - 15    Comment: Performed at [facility], [place] [address], [place], [place] [address]  I-stat chem 8, ED     Status: Abnormal   Collection Time: 08/18/21  6:27 PM  Result Value Ref Range   Sodium 141 135 - 145 mmol/L   Potassium 4.3 3.5 - 5.1 mmol/L   Chloride 103 98 - 111 mmol/L  BUN 13 8 - 23 mg/dL   Creatinine, Ser 1.00 0.61 - 1.24 mg/dL   Glucose, Bld 118 (H) 70 - 99 mg/dL    Comment: Glucose reference range applies only to samples taken after fasting for at least 8 hours.   Calcium, Ion 1.16 1.15 - 1.40 mmol/L   TCO2 27 22 - 32 mmol/L   Hemoglobin 18.7 (H) 13.0 - 17.0 g/dL   HCT 55.0 (H) 39.0 - 52.0 %  Resp Panel by RT-PCR (Flu A&B, Covid) Nasopharyngeal Swab     Status: None   Collection Time: 08/18/21  7:34 PM   Specimen: Nasopharyngeal Swab; Nasopharyngeal(NP) swabs in vial transport medium  Result Value Ref Range   SARS Coronavirus 2 by RT PCR NEGATIVE NEGATIVE    Comment: (NOTE) SARS-CoV-2 target nucleic acids are NOT DETECTED.  The SARS-CoV-2 RNA is generally detectable in upper respiratory specimens during the acute phase of infection. The  lowest concentration of SARS-CoV-2 viral copies this assay can detect is 138 copies/mL. A negative result does not preclude SARS-Cov-2 infection and should not be used as the sole basis for treatment or other patient management decisions. A negative result may occur with  improper specimen collection/handling, submission of specimen other than nasopharyngeal swab, presence of viral mutation(s) within the areas targeted by this assay, and inadequate number of viral copies(<138 copies/mL). A negative result must be combined with clinical observations, patient history, and epidemiological information. The expected result is Negative.  Fact Sheet for Patients:  EntrepreneurPulse.com.au  Fact Sheet for Healthcare Providers:  IncredibleEmployment.be  This test is no t yet approved or cleared by the [place] FDA and  has been authorized for detection and/or diagnosis of SARS-CoV-2 by FDA under an Emergency Use Authorization (EUA). This EUA will remain  in effect (meaning this test can be used) for the duration of the COVID-19 declaration under Section 564(b)(1) of the Act, 21 U.S.C.section 360bbb-3(b)(1), unless the authorization is terminated  or revoked sooner.       Influenza A by PCR NEGATIVE NEGATIVE   Influenza B by PCR NEGATIVE NEGATIVE    Comment: (NOTE) The Xpert Xpress SARS-CoV-2/FLU/RSV plus assay is intended as an aid in the diagnosis of influenza from Nasopharyngeal swab specimens and should not be used as a sole basis for treatment. Nasal washings and aspirates are unacceptable for Xpert Xpress SARS-CoV-2/FLU/RSV testing.  Fact Sheet for Patients: EntrepreneurPulse.com.au  Fact Sheet for Healthcare Providers: IncredibleEmployment.be  This test is not yet approved or cleared by the [place] FDA and has been authorized for detection and/or diagnosis of SARS-CoV-2 by FDA under an Emergency  Use Authorization (EUA). This EUA will remain in effect (meaning this test can be used) for the duration of the COVID-19 declaration under Section 564(b)(1) of the Act, 21 U.S.C. section 360bbb-3(b)(1), unless the authorization is terminated or revoked.  Performed at [facility], [place] [address] [place],  [address]     Chemistries  Recent Labs  Lab 08/18/21 1818 08/18/21 1827  NA 145 141  K 4.5 4.3  CL 106 103  CO2 27  --   GLUCOSE 115* 118*  BUN 13 13  CREATININE 1.03 1.00  CALCIUM 9.7  --   AST 15  --   ALT 13  --   ALKPHOS 86  --   BILITOT 0.6  --    ------------------------------------------------------------------------------------------------------------------  ------------------------------------------------------------------------------------------------------------------ GFR: CrCl cannot be calculated (Unknown ideal weight.). Liver Function Tests: Recent Labs  Lab 08/18/21 1818  AST 15  ALT 13  ALKPHOS 86  BILITOT 0.6  PROT 7.4  ALBUMIN 3.8   No results for input(s): LIPASE, AMYLASE in the last 168 hours. No results for input(s): AMMONIA in the last 168 hours. Coagulation Profile: Recent Labs  Lab 08/18/21 1818  INR 1.0   Cardiac Enzymes: No results for input(s): CKTOTAL, CKMB, CKMBINDEX, TROPONINI in the last 168 hours. BNP (last 3 results) No results for input(s): PROBNP in the last 8760 hours. HbA1C: No results for input(s): HGBA1C in the last 72 hours. CBG: No results for input(s): GLUCAP in the last 168 hours. Lipid Profile: No results for input(s): CHOL, HDL, LDLCALC, TRIG, CHOLHDL, LDLDIRECT in the last 72 hours. Thyroid Function Tests: No results for input(s): TSH, T4TOTAL, FREET4, T3FREE, THYROIDAB in the last 72 hours. Anemia Panel: No results for input(s): VITAMINB12, FOLATE, FERRITIN, TIBC, IRON, RETICCTPCT in the last 72  hours.  --------------------------------------------------------------------------------------------------------------- Urine analysis: No results found for: COLORURINE, APPEARANCEUR, LABSPEC, PHURINE, GLUCOSEU, HGBUR, BILIRUBINUR, KETONESUR, PROTEINUR, UROBILINOGEN, NITRITE, LEUKOCYTESUR    Imaging Results:    CT Angio Head W or Wo Contrast  Result Date: 08/18/2021 CLINICAL DATA:  Right upper extremity weakness EXAM: CT ANGIOGRAPHY HEAD AND NECK TECHNIQUE: Multidetector CT imaging of the head and neck was performed using the standard protocol during bolus administration of intravenous contrast. Multiplanar CT image reconstructions and MIPs were obtained to evaluate the vascular anatomy. Carotid stenosis measurements (when applicable) are obtained utilizing NASCET criteria, using the distal internal carotid diameter as the denominator. CONTRAST:  67mL OMNIPAQUE IOHEXOL 350 MG/ML SOLN COMPARISON:  None. FINDINGS: CTA NECK FINDINGS SKELETON: There is no bony spinal canal stenosis. No lytic or blastic lesion. OTHER NECK: Normal pharynx, larynx and major salivary glands. No cervical lymphadenopathy. Unremarkable thyroid gland. UPPER CHEST: Emphysema AORTIC ARCH: There is calcific atherosclerosis of the aortic arch. There is no aneurysm, dissection or hemodynamically significant stenosis of the visualized portion of the aorta. Conventional 3 vessel aortic branching pattern. The visualized proximal subclavian arteries are widely patent. RIGHT CAROTID SYSTEM: Normal without aneurysm, dissection or stenosis. LEFT CAROTID SYSTEM: Normal without aneurysm, dissection or stenosis. VERTEBRAL ARTERIES: Right dominant configuration. The left vertebral artery is occluded along its entire length. Right vertebral artery is normal. CTA HEAD FINDINGS POSTERIOR CIRCULATION: --Vertebral arteries: Normal V4 segments. --Inferior cerebellar arteries: Normal. --Basilar artery: Normal. --Superior cerebellar arteries: Normal.  --Posterior cerebral arteries (PCA): Normal. ANTERIOR CIRCULATION: --Intracranial internal carotid arteries: Low-density plaque within the clinoid segment of the left ICA causing severe stenosis. There is mild bilateral calcification. --Anterior cerebral arteries (ACA): Normal. Both A1 segments are present. Patent anterior communicating artery (a-comm). --Middle cerebral arteries (MCA): Normal. VENOUS SINUSES: As permitted by contrast timing, patent. 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 Review of the MIP images confirms the above findings. IMPRESSION: 1. 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 This could provide a source of emboli into the left MCA. 2. 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 3. 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 Aortic Atherosclerosis (ICD10-I70.0) and Emphysema (ICD10-J43.9). Electronically Signed   By: Ulyses Jarred M.D.   On: 08/18/2021 22:13   CT HEAD WO CONTRAST (5MM)  Result Date: 08/18/2021 CLINICAL DATA:  Trouble speaking right-sided weakness EXAM: CT HEAD WITHOUT CONTRAST TECHNIQUE: Contiguous axial images were obtained from the base of the skull through the  vertex without intravenous contrast. COMPARISON:  None. FINDINGS: Brain: No hemorrhage or intracranial is visualized. Hypodensity within the left parietal lobe and left cerebellum. Mild atrophy. Patchy white matter hypodensity consistent with chronic small vessel ischemic change. Vascular: Prominent vessel at the anterior left lateral ventricle could be related to developmental venous anomaly. Skull: No fracture Sinuses/Orbits: Small fluid in the left greater than right mastoid. Opacified right maxillary sinus Other: None IMPRESSION: 1. Moderate hypodensity within the left  parietal lobe with moderate hypodensity in the left cerebellum, consistent with age indeterminate infarcts; appearance suggests subacute to developing chronic infarcts though given history, correlation with MRI should be considered. No hemorrhage identified 2. Atrophy with mild chronic small vessel ischemic changes of the white matter Electronically Signed   By: Donavan Foil M.D.   On: 08/18/2021 19:21   CT ANGIO NECK W OR WO CONTRAST  Result Date: 08/18/2021 CLINICAL DATA:  Right upper extremity weakness EXAM: CT ANGIOGRAPHY HEAD AND NECK TECHNIQUE: Multidetector CT imaging of the head and neck was performed using the standard protocol during bolus administration of intravenous contrast. Multiplanar CT image reconstructions and MIPs were obtained to evaluate the vascular anatomy. Carotid stenosis measurements (when applicable) are obtained utilizing NASCET criteria, using the distal internal carotid diameter as the denominator. CONTRAST:  61mL OMNIPAQUE IOHEXOL 350 MG/ML SOLN COMPARISON:  None. FINDINGS: CTA NECK FINDINGS SKELETON: There is no bony spinal canal stenosis. No lytic or blastic lesion. OTHER NECK: Normal pharynx, larynx and major salivary glands. No cervical lymphadenopathy. Unremarkable thyroid gland. UPPER CHEST: Emphysema AORTIC ARCH: There is calcific atherosclerosis of the aortic arch. There is no aneurysm, dissection or hemodynamically significant stenosis of the visualized portion of the aorta. Conventional 3 vessel aortic branching pattern. The visualized proximal subclavian arteries are widely patent. RIGHT CAROTID SYSTEM: Normal without aneurysm, dissection or stenosis. LEFT CAROTID SYSTEM: Normal without aneurysm, dissection or stenosis. VERTEBRAL ARTERIES: Right dominant configuration. The left vertebral artery is occluded along its entire length. Right vertebral artery is normal. CTA HEAD FINDINGS POSTERIOR CIRCULATION: --Vertebral arteries: Normal V4 segments. --Inferior cerebellar  arteries: Normal. --Basilar artery: Normal. --Superior cerebellar arteries: Normal. --Posterior cerebral arteries (PCA): Normal. ANTERIOR CIRCULATION: --Intracranial internal carotid arteries: Low-density plaque within the clinoid segment of the left ICA causing severe stenosis. There is mild bilateral calcification. --Anterior cerebral arteries (ACA): Normal. Both A1 segments are present. Patent anterior communicating artery (a-comm). --Middle cerebral arteries (MCA): Normal. VENOUS SINUSES: As permitted by contrast timing, patent. 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 Review of the MIP images confirms the above findings. IMPRESSION: 1. 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 This could provide a source of emboli into the left MCA. 2. 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 3. 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 Aortic Atherosclerosis (ICD10-I70.0) and Emphysema (ICD10-J43.9). Electronically Signed   By: Ulyses Jarred M.D.   On: 08/18/2021 22:13   MR Brain Wo Contrast (neuro protocol)  Result Date: 08/18/2021 CLINICAL DATA:  Right upper extremity weakness with slurred speech EXAM: MRI HEAD WITHOUT CONTRAST TECHNIQUE: Multiplanar, multiecho pulse sequences of the brain and surrounding structures were obtained without intravenous contrast. COMPARISON:  None. FINDINGS: Brain: Large area of acute ischemia within the posterior left MCA territory. More anteriorly, there is a region that appears to be more subacute. There is petechial hemorrhage at  the infarct site with mild cytotoxic edema. There is multifocal hyperintense T2-weighted signal within the white matter. Generalized volume loss without a clear lobar predilection. The midline structures are normal.  Vascular: Major flow voids are preserved. Skull and upper cervical spine: Normal calvarium and skull base. Visualized upper cervical spine and soft tissues are normal. Sinuses/Orbits:Right maxillary sinus filling with bilateral mastoid effusions. Normal orbits. IMPRESSION: 1. Large area of mixed acute/subacute ischemia within the left MCA territory. 2. Petechial hemorrhage at the infarct site with mild cytotoxic edema. Heidelberg classification 1a: HI1, scattered small petechiae, no mass effect. Electronically Signed   By: Ulyses Jarred M.D.   On: 08/18/2021 20:39    My personal review of EKG: Rhythm sinus tachycardia, Rate114/min, QTc 441 ,no Acute ST changes   Assessment & Plan:    Active Problems:   CVA (cerebral vascular accident) ([place])   Tobacco use disorder   Essential hypertension   CVA Orders placed for CTA head and neck, echo, starting aspirin, ST/OT/PT eval and treat-as recommended by neurology Neuro to see patient today Lipid panel and hemoglobin A1c in the a.m. Stroke appears ischemic, but with petechiae we will closely monitor blood pressure to best prevent hemorrhagic conversion Neurochecks Ischemic stroke order set utilized Monitor on telemetry Tobacco use disorder Advised on the importance of cessation of cigarettes Nicotine patch ordered Essential hypertension Hydralazine to control blood patient is here in the hospital Will need to be discharged on oral agent   DVT Prophylaxis-    SCDs   AM Labs Ordered, also please review Full Orders  Family Communication: No family at bedside  Code Status:  FULL  Admission status: Observation Time spent in minutes : [place]

## 2021-08-19 NOTE — ED Notes (Signed)
Patient is resting comfortably. 

## 2021-08-19 NOTE — Evaluation (Signed)
Occupational Therapy Evaluation Patient Details Name: Todd Estrada MRN: [id number] DOB: September 20, 1961 Today's Date: 08/19/2021    History of Present Illness Gilmer Kaminsky  is a 60 y.o. male, with history of hypertension and tobacco abuse presents ED with a chief complaint of right upper extremity weakness and dysarthria.   Clinical Impression   Mr. Nahiem Dredge is a 60 year old man who is typically independent. On evaluation he demonstrates decreased ROM, strength, coordination and sensation in right dominant upper extremity. He is overall min assist to modified independent for ADLS and able to perform functional mobility (though assessment limited due to very short IV line and attached to bed). Patient reports two weeks at home with impaired arm. Patient will benefit from skilled OT services while in hospital to improve deficits and learn compensatory strategies as needed in order to return to PLOF. Patient will do best with OP OT - he may need assistance with setting up appointment and transportation. Will follow while in hospital.     Follow Up Recommendations  Outpatient OT    Equipment Recommendations  None recommended by OT    Recommendations for Other Services Speech consult     Precautions / Restrictions Precautions Precautions: None Restrictions Weight Bearing Restrictions: No      Mobility Bed Mobility Overal bed mobility: Modified Independent                  Transfers Overall transfer level: Modified independent                    Balance Overall balance assessment: No apparent balance deficits (not formally assessed)                                         ADL either performed or assessed with clinical judgement   ADL Overall ADL's : Needs assistance/impaired Eating/Feeding: Set up   Grooming: Set up   Upper Body Bathing: Modified independent   Lower Body Bathing: Modified independent   Upper Body Dressing : Modified  independent Upper Body Dressing Details (indicate cue type and reason): if no buttons Lower Body Dressing: Minimal assistance Lower Body Dressing Details (indicate cue type and reason): min assist for fasteners Toilet Transfer: Modified Independent   Toileting- Clothing Manipulation and Hygiene: Modified independent       Functional mobility during ADLs: Min guard General ADL Comments: Able to perform ADLs with difficulty as he does not have functional use of right hand.     Vision Patient Visual Report: No change from baseline Additional Comments: R eye deviates to the right - reports at baseline     Perception     Praxis      Pertinent Vitals/Pain Pain Assessment: No/denies pain     Hand Dominance Right   Extremity/Trunk Assessment Upper Extremity Assessment Upper Extremity Assessment: RUE deficits/detail;LUE deficits/detail RUE Deficits / Details: Grossly functional ROM in shoulder, elbow, decreased ROM in wrist and fingers. ABle to get wrist to slightly past neutral but with decreased strength 4/5. Unable to make fist, DIP and PIPs slightly flex. Tigtness noted in PIPs and DIPs. RUE Sensation: decreased light touch (reports arm is numb) RUE Coordination: decreased fine motor;decreased gross motor LUE Deficits / Details: WNL ROM, 5/5 strength LUE Sensation: WNL LUE Coordination: WNL   Lower Extremity Assessment Lower Extremity Assessment: Defer to PT evaluation   Cervical / Trunk Assessment Cervical /  Trunk Assessment: Normal   Communication Communication Communication: Expressive difficulties   Cognition Arousal/Alertness: Awake/alert Behavior During Therapy: WFL for tasks assessed/performed Overall Cognitive Status: Within Functional Limits for tasks assessed                                     General Comments       Exercises     Shoulder Instructions      Home Living Family/patient expects to be discharged to:: Private  residence Living Arrangements: Other relatives (uncle and cousin) Available Help at Discharge: Family;Available 24 hours/day Type of Home: House Home Access: Level entry     Home Layout: One level     Bathroom Shower/Tub: Teacher, early years/pre: Standard     Home Equipment: None          Prior Functioning/Environment Level of Independence: Independent                 OT Problem List: Decreased range of motion;Decreased strength;Decreased coordination      OT Treatment/Interventions: Self-care/ADL training;Therapeutic exercise;Neuromuscular education;Therapeutic activities;Patient/family education    OT Goals(Current goals can be found in the care plan section) Acute Rehab OT Goals Patient Stated Goal: improve right hand OT Goal Formulation: With patient Time For Goal Achievement: 09/02/21 Potential to Achieve Goals: Good  OT Frequency:     Barriers to D/C:            Co-evaluation              AM-PAC OT "6 Clicks" Daily Activity     Outcome Measure Help from another person eating meals?: A Little Help from another person taking care of personal grooming?: None Help from another person toileting, which includes using toliet, bedpan, or urinal?: None Help from another person bathing (including washing, rinsing, drying)?: None Help from another person to put on and taking off regular upper body clothing?: None Help from another person to put on and taking off regular lower body clothing?: A Little 6 Click Score: 22   End of Session Nurse Communication: Mobility status  Activity Tolerance: Patient tolerated treatment well Patient left: in bed;with call bell/phone within reach  OT Visit Diagnosis: Hemiplegia and hemiparesis Hemiplegia - Right/Left: Right Hemiplegia - dominant/non-dominant: Dominant Hemiplegia - caused by: Cerebral infarction                Time: 1350-1405 OT Time Calculation (min): 15 min Charges:  OT General  Charges $OT Visit: 1 Visit OT Evaluation $OT Eval Moderate Complexity: 1 Mod  Jearlean Demauro, OTR/L [facility]  Office [phone] Pager: [place] 08/19/2021, 2:22 PM

## 2021-08-19 NOTE — Progress Notes (Signed)
60 y.o. male, with history of hypertension and tobacco abuse presents ED with a chief complaint of right upper extremity weakness and dysarthria.  Patient's dysarthria progressed well in the ED to near aphasia.  CT head showed moderate hypodensity within left parietal lobe.  MRI was consistent with acute/subacute ischemia in the left MCA territory, petechial hemorrhage at the infarct side.  Neurology team consulted who recommended routine work-up.  Seen and examined in the ED still having right upper extremity weakness and mild dysarthria.  Vital signs are overall stable.  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  Large left periventricular developmental venous anomaly UDS-negative A1c, lipid panel pending   Routine stroke work-up in place for the acute CVA.  Neurology to see the patient. Accu-Cheks every 6 hours while patient is n.p.o.  PT/OT ordered.  Change fluids to D5 half-normal saline.   Gerlean Ren MD [facility]

## 2021-08-19 NOTE — ED Notes (Signed)
Pt attempted to provide urine specimen. Was unable to do so at this time. Urinal at bedside.

## 2021-08-19 NOTE — ED Notes (Signed)
Pt informed about bed status.

## 2021-08-20 ENCOUNTER — Inpatient Hospital Stay (HOSPITAL_COMMUNITY): Payer: Medicaid Other

## 2021-08-20 LAB — BASIC METABOLIC PANEL
Anion gap: 8 (ref 5–15)
BUN: 6 mg/dL — ABNORMAL LOW (ref 8–23)
CO2: 26 mmol/L (ref 22–32)
Calcium: 9.2 mg/dL (ref 8.9–10.3)
Chloride: 102 mmol/L (ref 98–111)
Creatinine, Ser: 0.75 mg/dL (ref 0.61–1.24)
GFR, Estimated: >60 mL/min (ref >60)
Glucose, Bld: 102 mg/dL — ABNORMAL HIGH (ref 70–99)
Potassium: 3.9 mmol/L (ref 3.5–5.1)
Sodium: 136 mmol/L (ref 135–145)

## 2021-08-20 LAB — GLUCOSE, CAPILLARY
Glucose-Capillary: 106 mg/dL — ABNORMAL HIGH (ref 70–99)
Glucose-Capillary: 126 mg/dL — ABNORMAL HIGH (ref 70–99)
Glucose-Capillary: 128 mg/dL — ABNORMAL HIGH (ref 70–99)

## 2021-08-20 LAB — MAGNESIUM: Magnesium: 1.8 mg/dL (ref 1.7–2.4)

## 2021-08-20 IMAGING — CT CT HEAD W/O CM
4 series · 15 of 47 positions shown, 17 images · non-contrast
Comparison: Prior head CT examination [DATE]. Brain MRI
[DATE]. CT angiogram head/neck [DATE].

CLINICAL DATA: Stroke, follow-up; follow-up petechial hemorrhage.

EXAM:
CT HEAD WITHOUT CONTRAST
TECHNIQUE: Contiguous axial images were obtained from the base of the skull
through the vertex without intravenous contrast.

[Series 2: head wo · axial · 0.47mm/px · z∈[+1325,+1445]mm · 7 of 33 slices shown, 9 images]
[im 5/33  brain]
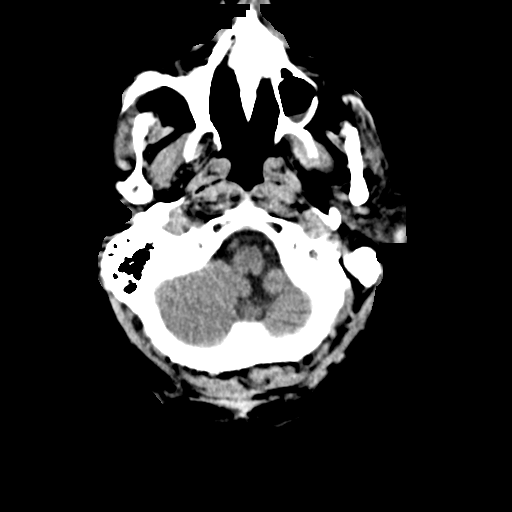
[im 5/33  bone]
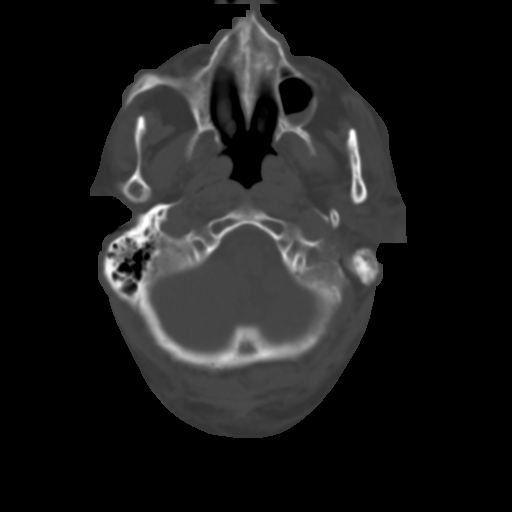
[im 9/33  brain]
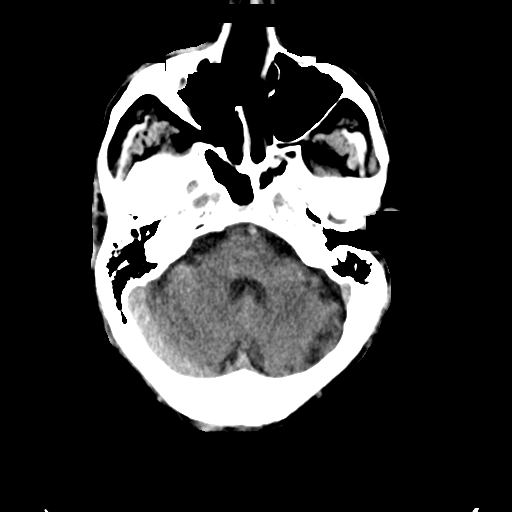
[im 13/33  brain]
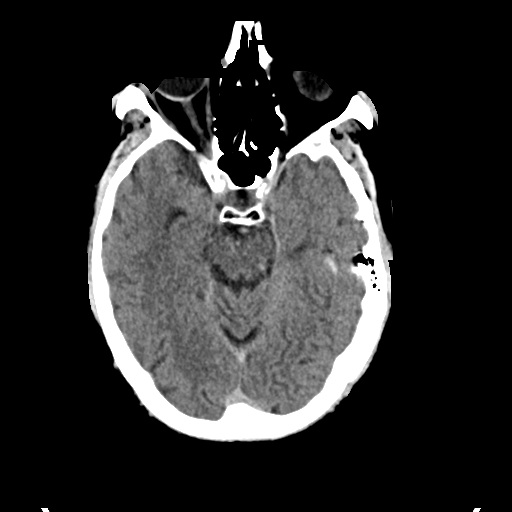
[im 17/33  brain]
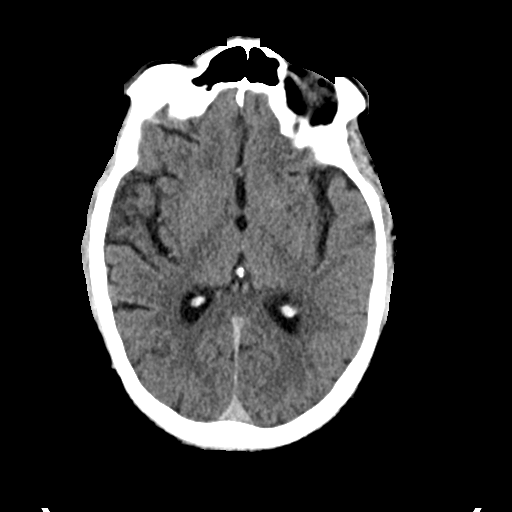
[im 21/33  brain]
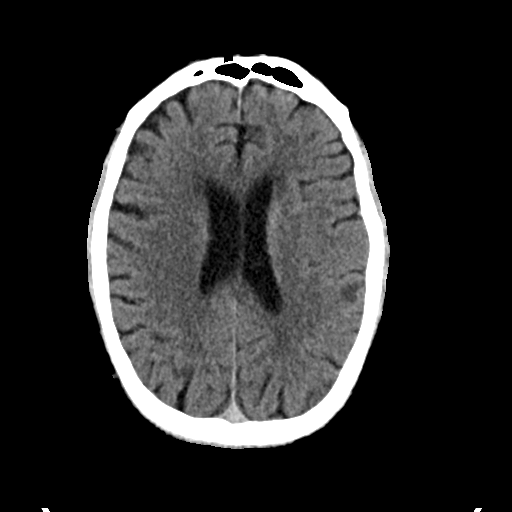
[im 21/33  bone]
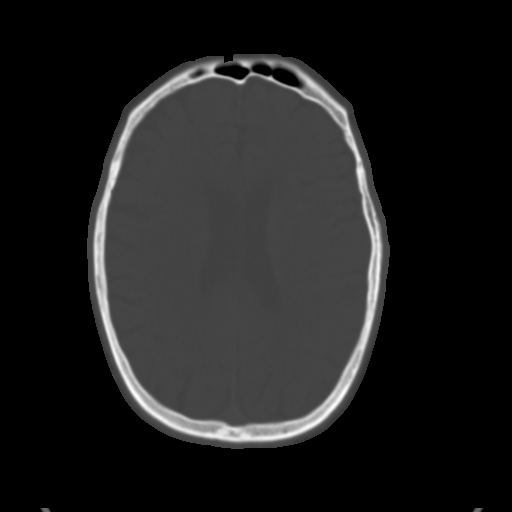
[im 25/33  brain]
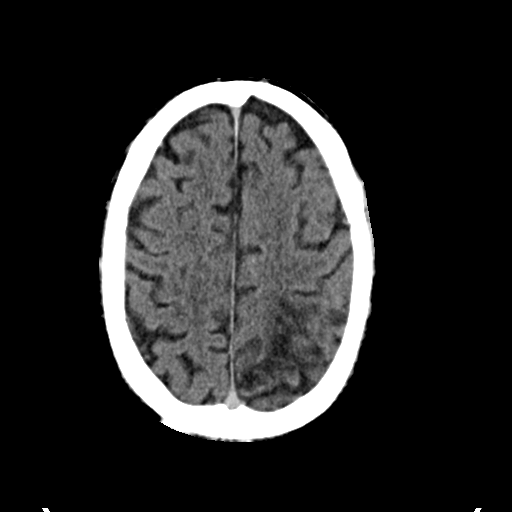
[im 29/33  brain]
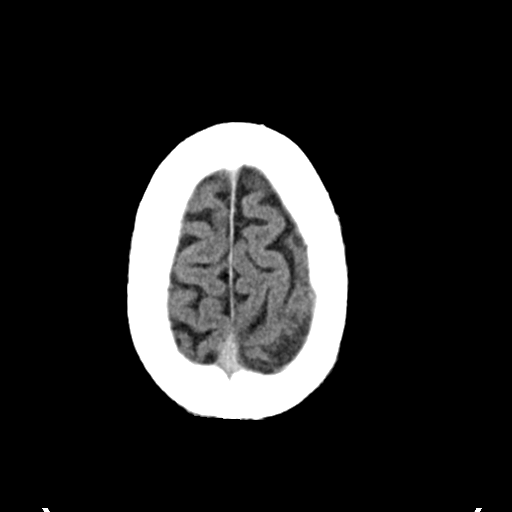

[Series 3: head bone · axial · 0.47mm/px · z∈[+1321,+1337]mm · 2 of 82 slices shown]
[im 9/82  bone]
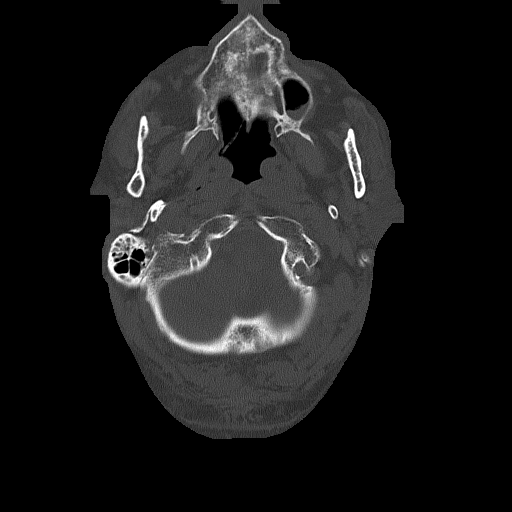
[im 17/82  bone]
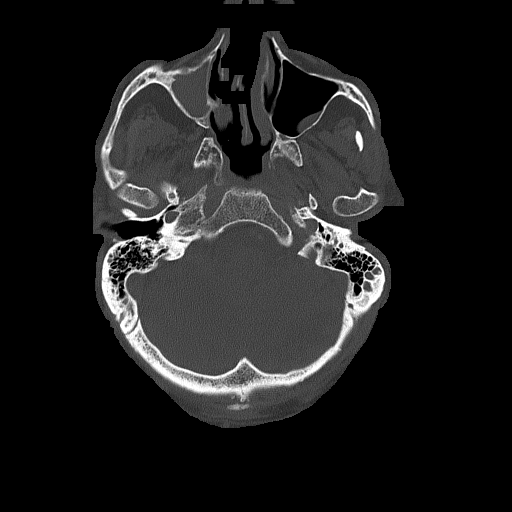

[Series 4: coronal soft tissue · coronal · 0.37mm/px · 3 of 74 slices shown]
[im 25/74  brain]
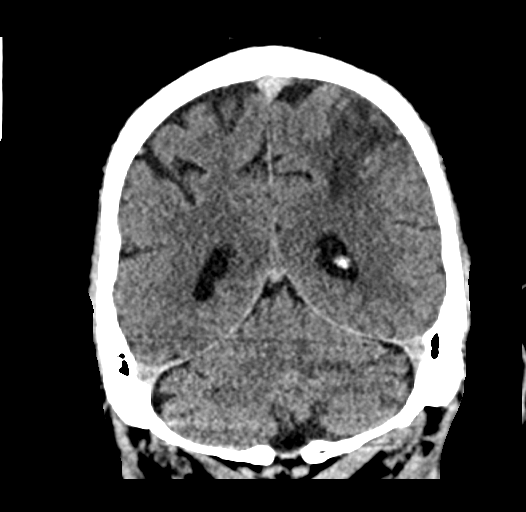
[im 33/74  brain]
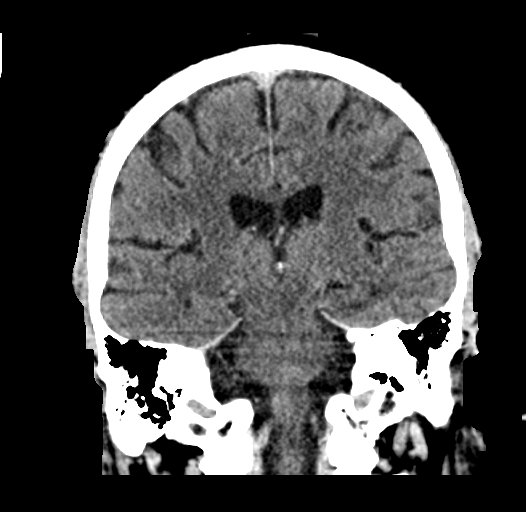
[im 41/74  brain]
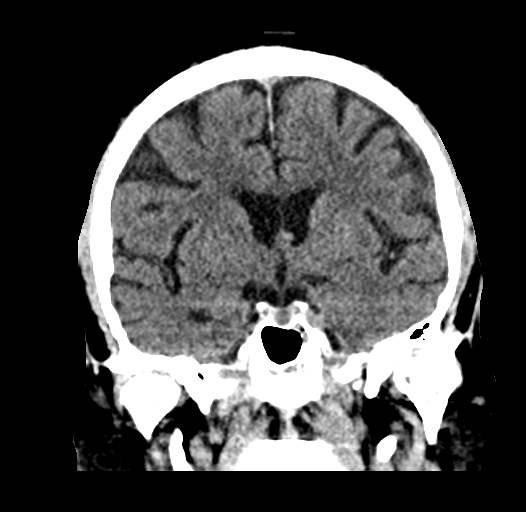

[Series 5: sagittal soft tissue · sagittal · 0.37mm/px · 3 of 67 slices shown]
[im 23/67  brain]
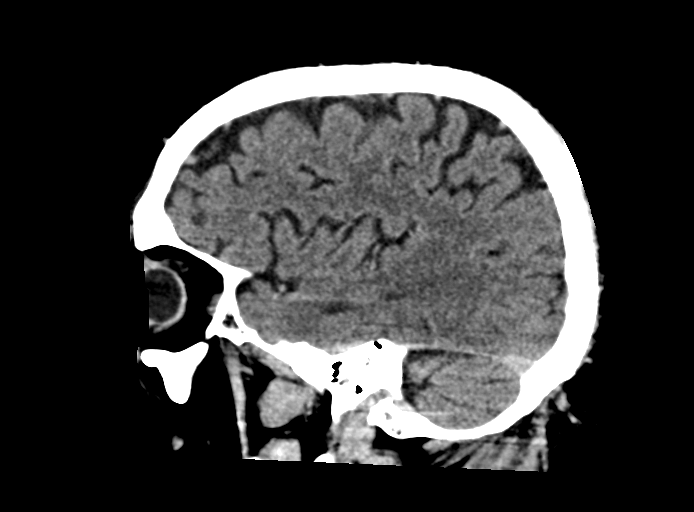
[im 34/67  brain]
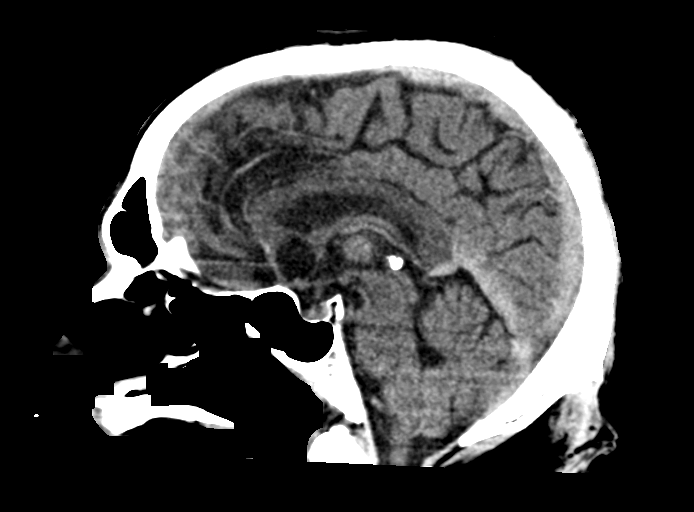
[im 45/67  brain]
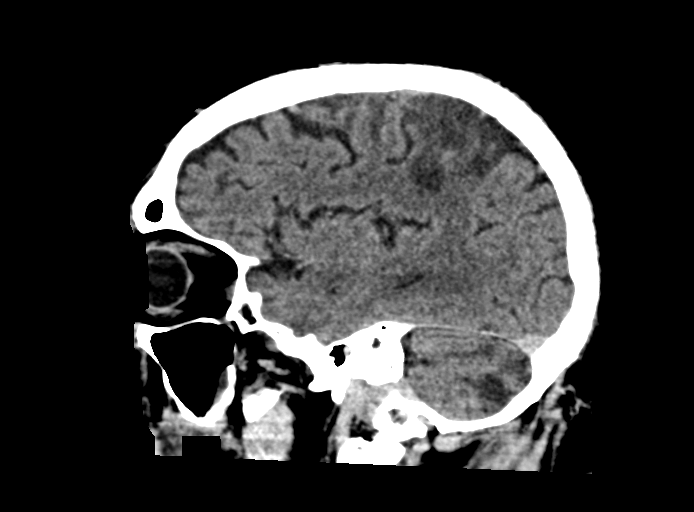

[15 of 47 positions shown; findings below may reference images not displayed]

FINDINGS: Brain:

Mild generalized cerebral and cerebellar atrophy.

Redemonstrated large evolving region of acute on subacute infarction
within the left MCA vascular territory. Mild petechial hemorrhage at
this site has not appreciably changed from the brain MRI of
[DATE].

Stable background chronic small-vessel ischemic changes within the
cerebral white matter. Redemonstrated chronic lacunar infarct within
the left basal ganglia.

Redemonstrated chronic infarcts within the left cerebellar
hemisphere.

No extra-axial fluid collection.

No evidence of an intracranial mass.

No midline shift.

Vascular: Unchanged hyperdensity along the anterior aspect of the
left lateral ventricle consistent with previously demonstrated large
developmental venous anomaly. Arterial atherosclerotic
calcifications.

Skull: Normal. Negative for fracture or focal lesion.

Sinuses/Orbits: Visualized orbits show no acute finding. Extensive
partial opacification of an asymmetrically diminutive right
maxillary sinus. No more than mild mucosal thickening elsewhere
within the paranasal sinuses.

Other: Small bilateral mastoid effusions.
IMPRESSION: Redemonstrated large evolving region of acute on subacute infarction
within the left MCA vascular territory. Mild petechial hemorrhage at
this site has not appreciably changed from the brain MRI of
[DATE]. This remains Heidelberg classification 1a: HI1,
scattered small petechiae, no mass effect.

Otherwise stable noncontrast CT appearance of the brain with sites
of chronic ischemia and chronic infarcts, as detailed.

Mild generalized cerebral atrophy.

Paranasal sinus disease, most notably severe right maxillary
sinusitis.

Small bilateral mastoid effusions.

## 2021-08-20 MED ORDER — ATORVASTATIN CALCIUM 40 MG PO TABS
80.0000 mg | ORAL_TABLET | Freq: Every day | ORAL | Status: DC
  Administered 2021-08-20 – 2021-08-21 (×2): 80 mg via ORAL
  Filled 2021-08-20 (×2): qty 2

## 2021-08-20 MED ORDER — METOPROLOL TARTRATE 25 MG PO TABS
12.5000 mg | ORAL_TABLET | Freq: Two times a day (BID) | ORAL | Status: DC
  Administered 2021-08-20 – 2021-08-21 (×3): 12.5 mg via ORAL
  Filled 2021-08-20 (×3): qty 1

## 2021-08-20 MED ORDER — MAGNESIUM SULFATE 2 GM/50ML IV SOLN
2.0000 g | Freq: Once | INTRAVENOUS | Status: AC
  Administered 2021-08-20: 2 g via INTRAVENOUS
  Filled 2021-08-20: qty 50

## 2021-08-20 MED ORDER — POTASSIUM CHLORIDE CRYS ER 20 MEQ PO TBCR
40.0000 meq | EXTENDED_RELEASE_TABLET | Freq: Once | ORAL | Status: AC
  Administered 2021-08-20: 40 meq via ORAL
  Filled 2021-08-20: qty 2

## 2021-08-20 NOTE — Evaluation (Signed)
Physical Therapy Evaluation Patient Details Name: Todd Estrada MRN: [id number] DOB: [birth year] Today's Date: 08/20/2021   History of Present Illness  Todd Estrada  is a 60 y.o. male, with history of hypertension and tobacco abuse presents ED with a chief complaint of right upper extremity weakness and dysarthria.  Clinical Impression  Pt admitted with above diagnosis.  Pt with unsteady gait with LOB x2 requiring min assist to recover. Pt reports he has  a cane  at home--? Accuracy of this.  Will continue to follow in acute setting. Pt would benefit from HHPT if has any benefit for such.    Pt currently with functional limitations due to the deficits listed below (see PT Problem List). Pt will benefit from skilled PT to increase their independence and safety with mobility to allow discharge to the venue listed below.        Follow Up Recommendations Home health PT    Equipment Recommendations  None recommended by PT    Recommendations for Other Services       Precautions / Restrictions Precautions Precautions: None Precaution Comments: right wrist splint Restrictions Weight Bearing Restrictions: No      Mobility  Bed Mobility Overal bed mobility: Modified Independent                  Transfers Overall transfer level: Needs assistance Equipment used: None Transfers: Sit to/from Stand Sit to Stand: Supervision            Ambulation/Gait Ambulation/Gait assistance: Min assist Gait Distance (Feet): 80 Feet Assistive device: 1 person hand held assist;None Gait Pattern/deviations: Step-through pattern;Decreased weight shift to right;Drifts right/left     General Gait Details: grossly unsteady gait with LOB x2 requiring min assist to recover. gait improved with unilateral HHA on L  Stairs            Wheelchair Mobility    Modified Rankin (Stroke Patients Only)       Balance Overall balance assessment: Needs assistance Sitting-balance support:  Feet supported;No upper extremity supported Sitting balance-Leahy Scale: Good       Standing balance-Leahy Scale: Fair Standing balance comment: requires UE  for dynamic tasks, assist for wt shifting outside BOS             High level balance activites: Head turns;Turns High Level Balance Comments: min assist for above             Pertinent Vitals/Pain Pain Assessment: Faces Faces Pain Scale: No hurt    Home Living Family/patient expects to be discharged to:: Private residence Living Arrangements: Other relatives Available Help at Discharge:  (lives with cousin and uncle, reports his uncle is in the hospital (?)) Type of Home: House Home Access: Level entry     Home Layout: One level Home Equipment: None      Prior Function Level of Independence: Independent               Hand Dominance   Dominant Hand: Right    Extremity/Trunk Assessment   Upper Extremity Assessment Upper Extremity Assessment: Defer to OT evaluation RUE Deficits / Details: Grossly functional ROM in shoulder, elbow, decreased ROM in wrist and fingers. ABle to get wrist to slightly past neutral but with decreased strength 4/5. Unable to make fist, DIP and PIPs slightly flex. Tigtness noted in PIPs and DIPs. RUE Sensation: decreased light touch RUE Coordination: decreased fine motor;decreased gross motor LUE Deficits / Details: WNL ROM, 5/5 strength    Lower Extremity Assessment  Lower Extremity Assessment: RLE deficits/detail RLE Deficits / Details: AROM grossly WFL,  knee extension and hip flexion 3+/5, otherwise grossly WFL RLE Sensation: decreased proprioception RLE Coordination: decreased gross motor       Communication      Cognition Arousal/Alertness: (P) Awake/alert Behavior During Therapy: (P) WFL for tasks assessed/performed Overall Cognitive Status: Impaired/Different from baseline Area of Impairment: Orientation;Following commands;Safety/judgement                  Orientation Level: Disoriented to;Place (states we are in teh hospital, initially unclear on city)     Following Commands: Follows one step commands consistently;Follows multi-step commands with increased time Safety/Judgement: Decreased awareness of safety;Decreased awareness of deficits            General Comments      Exercises     Assessment/Plan    PT Assessment Patient needs continued PT services  PT Problem List Decreased strength;Decreased mobility;Decreased activity tolerance;Pain;Decreased knowledge of use of DME;Decreased balance;Decreased safety awareness;Decreased coordination       PT Treatment Interventions DME instruction;Therapeutic exercise;Gait training;Functional mobility training;Therapeutic activities;Patient/family education;Balance training    PT Goals (Current goals can be found in the Care Plan section)  Acute Rehab PT Goals Patient Stated Goal: improve right hand and go home PT Goal Formulation: With patient Time For Goal Achievement: 09/03/21 Potential to Achieve Goals: Good    Frequency Min 3X/week   Barriers to discharge        Co-evaluation               AM-PAC PT "6 Clicks" Mobility  Outcome Measure Help needed turning from your back to your side while in a flat bed without using bedrails?: None Help needed moving from lying on your back to sitting on the side of a flat bed without using bedrails?: None Help needed moving to and from a bed to a chair (including a wheelchair)?: A Little Help needed standing up from a chair using your arms (e.g., wheelchair or bedside chair)?: None Help needed to walk in hospital room?: A Little Help needed climbing 3-5 steps with a railing? : A Little 6 Click Score: 21    End of Session Equipment Utilized During Treatment: Gait belt Activity Tolerance: Patient tolerated treatment well Patient left: in bed;with call bell/phone within reach;with bed alarm set   PT Visit Diagnosis: Other  abnormalities of gait and mobility (R26.89)    Time: 6045-4098 PT Time Calculation (min) (ACUTE ONLY): 18 min   Charges:   PT Evaluation $PT Eval Low Complexity: [place], PT  Acute Rehab Dept ([place]) [phone] Pager [phone]  08/20/2021   [facility] 08/20/2021, 4:41 PM

## 2021-08-20 NOTE — Plan of Care (Signed)
  Problem: Education: Goal: Knowledge of General Education information will improve Description: Including pain rating scale, medication(s)/side effects and non-pharmacologic comfort measures Outcome: Progressing   Problem: Safety: Goal: Ability to remain free from injury will improve Outcome: Progressing   Problem: Skin Integrity: Goal: Risk for impaired skin integrity will decrease Outcome: Progressing   Problem: Education: Goal: Knowledge of patient specific risk factors addressed and post discharge goals established will improve Outcome: Progressing   Problem: Self-Care: Goal: Ability to participate in self-care as condition permits will improve Outcome: Progressing Goal: Verbalization of feelings and concerns over difficulty with self-care will improve Outcome: Progressing

## 2021-08-20 NOTE — Progress Notes (Signed)
PROGRESS NOTE    Todd Estrada  WRU:[id number] DOB: 08/10/1961 DOA: 08/18/2021 PCP: Pcp, No    Brief Narrative:  60 year old male admitted to the hospital with 2-week history of right hand weakness.  He subsequently developed dysarthria/aphasia and came to the ER for evaluation where he was noted to have a left MCA infarct.  He was admitted for further treatments.   Assessment & Plan:   Active Problems:   CVA (cerebral vascular accident) ([place])   Tobacco use disorder   Essential hypertension   Acute/subacute CVA -Noted to have left MCA CVA -MRI imaging and did comment on small petechial hemorrhage -Seen by tele neurology and also case was also reviewed with Dr. 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 -Continue aspirin, statin  Hypertension -Started on metoprolol   DVT prophylaxis: SCD's Start: 08/19/21 1401  Code Status: Full code Family Communication: Discussed with patient Disposition Plan: Status is: Inpatient  Remains inpatient appropriate because:Inpatient level of care appropriate due to severity of illness  Dispo: The patient is from: Home              Anticipated d/c is to: Home              Patient currently is not medically stable to d/c.   Difficult  to place patient No         Consultants:  Neurology  Procedures:    Antimicrobials:      Subjective: He reports he is feeling better.  No chest pain or shortness of breath.  Objective: Vitals:   08/19/21 2150 08/20/21 0212 08/20/21 0633 08/20/21 1355  BP: 121/69 (!) 149/97 (!) 150/87 (!) 158/95  Pulse: 85 82 83 83  Resp:  18 16 20   Temp: 98.4 F (36.9 C) 98.4 F (36.9 C) 97.6 F (36.4 C) 98.3 F (36.8 C)  TempSrc: Axillary Oral Oral Oral  SpO2: 92% 97%    Weight:      Height:        Intake/Output Summary (Last 24 hours) at 08/20/2021 1948 Last data filed at 08/20/2021 1400 Gross per 24 hour  Intake 47.39 ml  Output 1525 ml  Net -1477.61 ml   Filed Weights   08/19/21 0852  Weight: 88.5 kg    Examination:  General exam: Appears calm and comfortable  Respiratory system: Clear to auscultation. Respiratory effort normal. Cardiovascular system: S1 & S2 heard, RRR. No JVD, murmurs, rubs, gallops or clicks. No pedal edema. Gastrointestinal system: Abdomen is nondistended, soft and nontender. No organomegaly or masses felt. Normal bowel sounds heard. Central nervous system: Alert and oriented.  Continues to have some dysarthria and right grip weakness Extremities: Symmetric 5 x 5 power. Skin: No rashes, lesions or ulcers Psychiatry: Judgement and insight appear normal. Mood & affect appropriate.  Data Reviewed: I have personally reviewed following labs and imaging studies  CBC: Recent Labs  Lab 08/18/21 1818 08/18/21 1827 08/19/21 0748  WBC 8.8  --  8.2  NEUTROABS 5.9  --   --   HGB 18.2* 18.7* 17.6*  HCT 54.8* 55.0* 53.5*  MCV 92.9  --  93.2  PLT 250  --  867   Basic Metabolic Panel: Recent Labs  Lab 08/18/21 1818 08/18/21 1827 08/19/21 1416 08/20/21 0401  NA 145 141 138 136  K 4.5 4.3 4.6 3.9  CL 106 103 104 102  CO2 27  --  24 26  GLUCOSE 115* 118* 115* 102*  BUN 13 13 7* 6*  CREATININE 1.03 1.00 0.63 0.75  CALCIUM 9.7  --  9.2 9.2   MG  --   --   --  1.8   GFR: Estimated Creatinine Clearance: 106.4 mL/min (by C-G formula based on SCr of 0.75 mg/dL). Liver Function Tests: Recent Labs  Lab 08/18/21 1818 08/19/21 1416  AST 15 26  ALT 13 12  ALKPHOS 86 70  BILITOT 0.6 1.6*  PROT 7.4 7.4  ALBUMIN 3.8 3.7   No results for input(s): LIPASE, AMYLASE in the last 168 hours. No results for input(s): AMMONIA in the last 168 hours. Coagulation Profile: Recent Labs  Lab 08/18/21 1818  INR 1.0   Cardiac Enzymes: No results for input(s): CKTOTAL, CKMB, CKMBINDEX, TROPONINI in the last 168 hours. BNP (last 3 results) No results for input(s): PROBNP in the last 8760 hours. HbA1C: Recent Labs    08/19/21 0749  HGBA1C 5.5   CBG: Recent Labs  Lab 08/19/21 0820 08/19/21 1338 08/20/21 0040 08/20/21 1226 08/20/21 1804  GLUCAP 108* 119* 106* 128* 126*   Lipid Profile: Recent Labs    08/19/21 1416  CHOL 172  HDL 35*  LDLCALC 120*  TRIG 85  CHOLHDL 4.9   Thyroid Function Tests: No results for input(s): TSH, T4TOTAL, FREET4, T3FREE, THYROIDAB in the last 72 hours. Anemia Panel: No results for input(s): VITAMINB12, FOLATE, FERRITIN, TIBC, IRON, RETICCTPCT in the last 72 hours. Sepsis Labs: No results for input(s): PROCALCITON, LATICACIDVEN in the last 168 hours.  Recent Results (from the past 240 hour(s))  Resp Panel by RT-PCR (Flu A&B, Covid) Nasopharyngeal Swab     Status: None   Collection Time: 08/18/21  7:34 PM   Specimen: Nasopharyngeal Swab; Nasopharyngeal(NP) swabs in vial transport medium  Result Value Ref Range Status   SARS Coronavirus 2 by RT PCR NEGATIVE NEGATIVE Final    Comment: (NOTE) SARS-CoV-2 target nucleic acids are NOT DETECTED.  The SARS-CoV-2 RNA is generally detectable in upper respiratory specimens during the acute phase of infection. The lowest concentration of SARS-CoV-2 viral copies this assay can detect is 138 copies/mL. A negative result does not preclude  SARS-Cov-2 infection and should not be used as the sole basis for treatment or other patient management decisions. A negative result may occur with  improper specimen collection/handling, submission of specimen other than nasopharyngeal swab, presence of viral mutation(s) within the areas targeted by this assay, and inadequate number of viral copies(<138 copies/mL). A negative result must be combined with clinical observations, patient history, and epidemiological information. The expected result is Negative.  Fact Sheet for Patients:  EntrepreneurPulse.com.au  Fact Sheet for Healthcare Providers:  IncredibleEmployment.be  This test is no t yet approved or cleared by the [place] FDA and  has been authorized for detection and/or diagnosis of SARS-CoV-2 by FDA under an Emergency  Use Authorization (EUA). This EUA will remain  in effect (meaning this test can be used) for the duration of the COVID-19 declaration under Section 564(b)(1) of the Act, 21 U.S.C.section 360bbb-3(b)(1), unless the authorization is terminated  or revoked sooner.       Influenza A by PCR NEGATIVE NEGATIVE Final   Influenza B by PCR NEGATIVE NEGATIVE Final    Comment: (NOTE) The Xpert Xpress SARS-CoV-2/FLU/RSV plus assay is intended as an aid in the diagnosis of influenza from Nasopharyngeal swab specimens and should not be used as a sole basis for treatment. Nasal washings and aspirates are unacceptable for Xpert Xpress SARS-CoV-2/FLU/RSV testing.  Fact Sheet for Patients: EntrepreneurPulse.com.au  Fact Sheet for Healthcare Providers: IncredibleEmployment.be  This test is not yet approved or cleared by the [place] FDA and has been authorized for detection and/or diagnosis of SARS-CoV-2 by FDA under an Emergency Use Authorization (EUA). This EUA will remain in effect (meaning this test can be used) for the duration of  the COVID-19 declaration under Section 564(b)(1) of the Act, 21 U.S.C. section 360bbb-3(b)(1), unless the authorization is terminated or revoked.  Performed at [facility], [place] [address], [place], [place] [address]          Radiology Studies: CT Angio Head W or Wo Contrast  Result Date: 08/18/2021 CLINICAL DATA:  Right upper extremity weakness EXAM: CT ANGIOGRAPHY HEAD AND NECK TECHNIQUE: Multidetector CT imaging of the head and neck was performed using the standard protocol during bolus administration of intravenous contrast. Multiplanar CT image reconstructions and MIPs were obtained to evaluate the vascular anatomy. Carotid stenosis measurements (when applicable) are obtained utilizing NASCET criteria, using the distal internal carotid diameter as the denominator. CONTRAST:  24mL OMNIPAQUE IOHEXOL 350 MG/ML SOLN COMPARISON:  None. FINDINGS: CTA NECK FINDINGS SKELETON: There is no bony spinal canal stenosis. No lytic or blastic lesion. OTHER NECK: Normal pharynx, larynx and major salivary glands. No cervical lymphadenopathy. Unremarkable thyroid gland. UPPER CHEST: Emphysema AORTIC ARCH: There is calcific atherosclerosis of the aortic arch. There is no aneurysm, dissection or hemodynamically significant stenosis of the visualized portion of the aorta. Conventional 3 vessel aortic branching pattern. The visualized proximal subclavian arteries are widely patent. RIGHT CAROTID SYSTEM: Normal without aneurysm, dissection or stenosis. LEFT CAROTID SYSTEM: Normal without aneurysm, dissection or stenosis. VERTEBRAL ARTERIES: Right dominant configuration. The left vertebral artery is occluded along its entire length. Right vertebral artery is normal. CTA HEAD FINDINGS POSTERIOR CIRCULATION: --Vertebral arteries: Normal V4 segments. --Inferior cerebellar arteries: Normal. --Basilar artery: Normal. --Superior cerebellar arteries: Normal. --Posterior cerebral arteries (PCA): Normal.  ANTERIOR CIRCULATION: --Intracranial internal carotid arteries: Low-density plaque within the clinoid segment of the left ICA causing severe stenosis. There is mild bilateral calcification. --Anterior cerebral arteries (ACA): Normal. Both A1 segments are present. Patent anterior communicating artery (a-comm). --Middle cerebral arteries (MCA): Normal. VENOUS SINUSES: As permitted by contrast timing, patent. 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 Review of the MIP images confirms the above findings. IMPRESSION: 1. 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 This could provide a source of emboli into the left MCA. 2. 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 3. 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 Aortic Atherosclerosis (ICD10-I70.0) and Emphysema (ICD10-J43.9). Electronically Signed   By: Ulyses Jarred M.D.   On: 08/18/2021 22:13  CT HEAD WO CONTRAST (5MM)  Result Date: 08/20/2021 CLINICAL DATA:  Stroke, follow-up; follow-up petechial hemorrhage. EXAM: CT HEAD WITHOUT CONTRAST TECHNIQUE: Contiguous axial images were obtained from the base of the skull through the vertex without intravenous contrast. COMPARISON:  Prior head CT examination 08/18/2021. Brain MRI 08/18/2021. CT angiogram head/neck 08/18/2021. FINDINGS: Brain: Mild generalized cerebral and cerebellar atrophy. Redemonstrated large evolving region of acute on subacute infarction within the left MCA vascular territory. Mild petechial hemorrhage at this site has not appreciably changed from the brain MRI of 08/18/2021. Stable background chronic small-vessel ischemic changes within the cerebral white matter. Redemonstrated chronic lacunar infarct within the left basal ganglia. Redemonstrated chronic infarcts  within the left cerebellar hemisphere. No extra-axial fluid collection. No evidence of an intracranial mass. No midline shift. Vascular: Unchanged hyperdensity along the anterior aspect of the left lateral ventricle consistent with previously demonstrated large developmental venous anomaly. Arterial atherosclerotic calcifications. Skull: Normal. Negative for fracture or focal lesion. Sinuses/Orbits: Visualized orbits show no acute finding. Extensive partial opacification of an asymmetrically diminutive right maxillary sinus. No more than mild mucosal thickening elsewhere within the paranasal sinuses. Other: Small bilateral mastoid effusions. IMPRESSION: Redemonstrated large evolving region of acute on subacute infarction within the left MCA vascular territory. Mild petechial hemorrhage at this site has not appreciably changed from the brain MRI of 08/18/2021. This remains Heidelberg classification 1a: HI1, scattered small petechiae, no mass effect. Otherwise stable noncontrast CT appearance of the brain with sites of chronic ischemia and chronic infarcts, as detailed. Mild generalized cerebral atrophy. Paranasal sinus disease, most notably severe right maxillary sinusitis. Small bilateral mastoid effusions. Electronically Signed   By: Kellie Simmering D.O.   On: 08/20/2021 16:39   CT ANGIO NECK W OR WO CONTRAST  Result Date: 08/18/2021 CLINICAL DATA:  Right upper extremity weakness EXAM: CT ANGIOGRAPHY HEAD AND NECK TECHNIQUE: Multidetector CT imaging of the head and neck was performed using the standard protocol during bolus administration of intravenous contrast. Multiplanar CT image reconstructions and MIPs were obtained to evaluate the vascular anatomy. Carotid stenosis measurements (when applicable) are obtained utilizing NASCET criteria, using the distal internal carotid diameter as the denominator. CONTRAST:  29mL OMNIPAQUE IOHEXOL 350 MG/ML SOLN COMPARISON:  None. FINDINGS: CTA NECK FINDINGS SKELETON: There  is no bony spinal canal stenosis. No lytic or blastic lesion. OTHER NECK: Normal pharynx, larynx and major salivary glands. No cervical lymphadenopathy. Unremarkable thyroid gland. UPPER CHEST: Emphysema AORTIC ARCH: There is calcific atherosclerosis of the aortic arch. There is no aneurysm, dissection or hemodynamically significant stenosis of the visualized portion of the aorta. Conventional 3 vessel aortic branching pattern. The visualized proximal subclavian arteries are widely patent. RIGHT CAROTID SYSTEM: Normal without aneurysm, dissection or stenosis. LEFT CAROTID SYSTEM: Normal without aneurysm, dissection or stenosis. VERTEBRAL ARTERIES: Right dominant configuration. The left vertebral artery is occluded along its entire length. Right vertebral artery is normal. CTA HEAD FINDINGS POSTERIOR CIRCULATION: --Vertebral arteries: Normal V4 segments. --Inferior cerebellar arteries: Normal. --Basilar artery: Normal. --Superior cerebellar arteries: Normal. --Posterior cerebral arteries (PCA): Normal. ANTERIOR CIRCULATION: --Intracranial internal carotid arteries: Low-density plaque within the clinoid segment of the left ICA causing severe stenosis. There is mild bilateral calcification. --Anterior cerebral arteries (ACA): Normal. Both A1 segments are present. Patent anterior communicating artery (a-comm). --Middle cerebral arteries (MCA): Normal. VENOUS SINUSES: As permitted by contrast timing, patent. 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 Review of the MIP images confirms the above findings. IMPRESSION:  1. 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 This could provide a source of emboli into the left MCA. 2. 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 3. Large left periventricular developmental venous anomaly that drains into the  left internal cerebral vein. Aortic Atherosclerosis (ICD10-I70.0) and Emphysema (ICD10-J43.9). Electronically Signed   By: Ulyses Jarred M.D.   On: 08/18/2021 22:13   MR Brain Wo Contrast (neuro protocol)  Result Date: 08/18/2021 CLINICAL DATA:  Right upper extremity weakness with slurred speech EXAM: MRI HEAD WITHOUT CONTRAST TECHNIQUE: Multiplanar, multiecho pulse sequences of the brain and surrounding structures were obtained without intravenous contrast. COMPARISON:  None. FINDINGS: Brain: Large area of acute ischemia within the posterior left MCA territory. More anteriorly, there is a region that appears to be more subacute. There is petechial hemorrhage at the infarct site with mild cytotoxic edema. There is multifocal hyperintense T2-weighted signal within the white matter. Generalized volume loss without a clear lobar predilection. The midline structures are normal. Vascular: Major flow voids are preserved. Skull and upper cervical spine: Normal calvarium and skull base. Visualized upper cervical spine and soft tissues are normal. Sinuses/Orbits:Right maxillary sinus filling with bilateral mastoid effusions. Normal orbits. IMPRESSION: 1. Large area of mixed acute/subacute ischemia within the left MCA territory. 2. Petechial hemorrhage at the infarct site with mild cytotoxic edema. Heidelberg classification 1a: HI1, scattered small petechiae, no mass effect. Electronically Signed   By: Ulyses Jarred M.D.   On: 08/18/2021 20:39   ECHOCARDIOGRAM COMPLETE  Result Date: 08/19/2021    ECHOCARDIOGRAM REPORT   Patient Name:   Todd Estrada Date of Exam: 08/19/2021 Medical Rec #:  [id number]     Height:       72.0 in Accession #:    [id number]    Weight:       195.0 lb Date of Birth:  [birth year]     BSA:          2.108 m Patient Age:    50 years      BP:           149/95 mmHg Patient Gender: M             HR:           76 bpm. Exam Location:  Inpatient Procedure: 2D Echo, Cardiac Doppler, Color Doppler and  Intracardiac            Opacification Agent Indications:    CVA  History:        Patient has no prior history of Echocardiogram examinations.                 COPD and Stroke, Arrythmias:Tachycardia; Risk                 Factors:Hypertension and Current Smoker.  Sonographer:    Dustin Flock RDCS Referring Phys: [id number] ASIA B [place]  Sonographer Comments: Technically difficult study due to poor echo windows. Image acquisition challenging due to COPD. IMPRESSIONS  1. Left ventricular ejection fraction, by estimation, is 40 to 45%. The left ventricle has mildly decreased function. Left ventricular endocardial border not optimally defined to evaluate regional wall motion. There is mild left ventricular hypertrophy.  Left ventricular diastolic parameters are consistent with Grade I diastolic dysfunction (impaired relaxation).  2. Right ventricular systolic function is normal. The right ventricular size is normal.  3. The mitral valve is normal in structure. No evidence  of mitral valve regurgitation. No evidence of mitral stenosis.  4. The aortic valve is normal in structure. Aortic valve regurgitation is not visualized. Mild aortic valve sclerosis is present, with no evidence of aortic valve stenosis.  5. The inferior vena cava is normal in size with greater than 50% respiratory variability, suggesting right atrial pressure of 3 mmHg. Conclusion(s)/Recommendation(s): No intracardiac source of embolism detected on this transthoracic study. A transesophageal echocardiogram is recommended to exclude cardiac source of embolism if clinically indicated. FINDINGS  Left Ventricle: Left ventricular ejection fraction, by estimation, is 40 to 45%. The left ventricle has mildly decreased function. Left ventricular endocardial border not optimally defined to evaluate regional wall motion. Definity contrast agent was given IV to delineate the left ventricular endocardial borders. The left ventricular internal cavity size  was normal in size. There is mild left ventricular hypertrophy. Left ventricular diastolic parameters are consistent with Grade I diastolic dysfunction (impaired relaxation). Right Ventricle: The right ventricular size is normal. No increase in right ventricular wall thickness. Right ventricular systolic function is normal. Left Atrium: Left atrial size was normal in size. Right Atrium: Right atrial size was normal in size. Pericardium: There is no evidence of pericardial effusion. Mitral Valve: The mitral valve is normal in structure. No evidence of mitral valve regurgitation. No evidence of mitral valve stenosis. Tricuspid Valve: The tricuspid valve is normal in structure. Tricuspid valve regurgitation is not demonstrated. No evidence of tricuspid stenosis. Aortic Valve: The aortic valve is normal in structure. Aortic valve regurgitation is not visualized. Mild aortic valve sclerosis is present, with no evidence of aortic valve stenosis. Pulmonic Valve: The pulmonic valve was normal in structure. Pulmonic valve regurgitation is not visualized. No evidence of pulmonic stenosis. Aorta: The aortic root is normal in size and structure. Venous: The inferior vena cava is normal in size with greater than 50% respiratory variability, suggesting right atrial pressure of 3 mmHg. IAS/Shunts: No atrial level shunt detected by color flow Doppler.  LEFT VENTRICLE PLAX 2D LVIDd:         5.30 cm Diastology LVIDs:         4.90 cm LV e' medial:    4.57 cm/s LV PW:         1.20 cm LV E/e' medial:  13.3 LV IVS:        1.20 cm LV e' lateral:   4.24 cm/s                        LV E/e' lateral: 14.3  RIGHT VENTRICLE RV Basal diam:  2.80 cm RV S prime:     12.50 cm/s TAPSE (M-mode): 2.3 cm LEFT ATRIUM           Index       RIGHT ATRIUM           Index LA diam:      4.20 cm 1.99 cm/m  RA Area:     11.80 cm LA Vol (A2C): 65.6 ml 31.12 ml/m RA Volume:   27.20 ml  12.90 ml/m LA Vol (A4C): 23.6 ml 11.20 ml/m  AORTIC VALVE LVOT Vmax:    94.60 cm/s LVOT Vmean:  61.100 cm/s LVOT VTI:    0.145 m  AORTA Ao Root diam: 3.60 cm MITRAL VALVE MV Area (PHT): 6.71 cm     SHUNTS MV Decel Time: 113 msec     Systemic VTI: 0.14 m MV E velocity: 60.60 cm/s MV A velocity: 102.00 cm/s MV E/A  ratio:  0.59 Candee Furbish MD Electronically signed by Candee Furbish MD Signature Date/Time: 08/19/2021/3:23:25 PM    Final         Scheduled Meds:  aspirin EC  81 mg Oral Daily   atorvastatin  80 mg Oral Daily   metoprolol tartrate  12.5 mg Oral BID   nicotine  21 mg Transdermal Daily   Continuous Infusions:   LOS: 1 day    Time spent: 35 minutes    Kathie Dike, MD [facility]   If 7PM-7AM, please contact night-coverage [email or web address]  08/20/2021, 7:48 PM

## 2021-08-20 NOTE — Evaluation (Signed)
Speech Language Pathology Evaluation Patient Details Name: Todd Estrada MRN: [id number] DOB: 1961-05-05 Today's Date: 08/20/2021 Time: 9379-0240 SLP Time Calculation (min) (ACUTE ONLY): 29 min  Problem List:  Patient Active Problem List   Diagnosis Date Noted   Tobacco use disorder 08/19/2021   Essential hypertension 08/19/2021   CVA (cerebral vascular accident) ([place]) 08/18/2021   Past Medical History:  Past Medical History:  Diagnosis Date   Hypertension    Past Surgical History: History reviewed. No pertinent surgical history. HPI:  Pt is a 60 y.o. male who presented to ED with a chief complaint of right upper extremity weakness and dysarthria. DO note (08/19/21) reports that "patient's dysarthria progressed well in the ED to near aphasia". CT head (08/18/21) showed moderate hypodensity within left parietal lobe.  MRI (08/18/21) was consistent with acute/subacute ischemia in the left MCA territory, petechial hemorrhage at the infarct side. PMH: hypertension and tobacco abuse.   Assessment / Plan / Recommendation Clinical Impression  Pt presents with cognitive communication deficits as well as mild dysarthria and expressive aphasia post CVA. He is oriented to self, place and situation, however had difficulty with recalling current date, including year. Long term and immediate memory appear to be [facility], while problem solving, executive functions and delayed recall are reduced. Speech intelligibility in conversation was roughly 75-80% and improved with cues for overarticulation and increasing of vocal intensity, however question impact of edentulous status on speech clarity. Auditory comprehension across several structured tasks was [facility], with mild word finding noted during conversational speech. With time to process and semantic cues, this improved. SLP to continue f/u to address cognitive, speech and language deficits during acute stay.    SLP Assessment  SLP Recommendation/Assessment: Patient  needs continued Speech Lanaguage Pathology Services SLP Visit Diagnosis: Dysarthria and anarthria (R47.1);Aphasia (R47.01);Cognitive communication deficit (R41.841)    Follow Up Recommendations  Outpatient SLP    Frequency and Duration min 2x/week  2 weeks      SLP Evaluation Cognition  Overall Cognitive Status: Impaired/Different from baseline Arousal/Alertness: Awake/alert Orientation Level: Oriented to person;Oriented to place;Oriented to situation;Disoriented to time Year: 2021 Month: August Day of Week: Correct Attention: Selective Selective Attention: Appears intact Memory: Impaired Memory Impairment: Retrieval deficit;Decreased recall of new information Immediate Memory Recall:  (3/3) Awareness: Impaired Awareness Impairment: Intellectual impairment Problem Solving: Impaired Problem Solving Impairment: Functional basic Executive Function: Reasoning;Decision Making Reasoning: Impaired Reasoning Impairment: Functional basic Decision Making: Impaired Decision Making Impairment: Functional basic Safety/Judgment: Impaired       Comprehension  Auditory Comprehension Overall Auditory Comprehension: Appears within functional limits for tasks assessed Visual Recognition/Discrimination Discrimination: Not tested Reading Comprehension Reading Status: Unable to assess (comment) (pt reports that he cannot read)    Expression Expression Primary Mode of Expression: Verbal Verbal Expression Overall Verbal Expression: Impaired Initiation: No impairment Automatic Speech: Name;Social Response;Day of week Level of Generative/Spontaneous Verbalization: Conversation Naming: Impairment Responsive: 76-100% accurate Confrontation: Impaired Convergent: 75-100% accurate Divergent: 75-100% accurate Effective Techniques: Semantic cues Written Expression Dominant Hand: Right Written Expression: Unable to assess (comment) (pt reports that he cannot write)   Oral / Motor  Oral  Motor/Sensory Function Overall Oral Motor/Sensory Function: Within functional limits Motor Speech Overall Motor Speech: Impaired Respiration: Within functional limits Phonation: Low vocal intensity Resonance: Within functional limits Articulation: Impaired Level of Impairment: Sentence Intelligibility: Intelligibility reduced Sentence: 75-100% accurate Conversation: 75-100% accurate Motor Planning: Witnin functional limits Motor Speech Errors: Unaware Interfering Components: Inadequate dentition Effective Techniques: Increased vocal intensity;Over-articulate   GO  [place] Dense, [place], [place] [facility] Office Number: [phone]  Acie Fredrickson 08/20/2021, 1:53 PM

## 2021-08-20 NOTE — Progress Notes (Signed)
Neurology Telephone Note  Called by Dr. Ulice Bold for telephone recommendations regarding this patient. He has a history of hypertension and tobacco abuse. He presented to [facility] ED on 08/18/21 reported RUE weakness x2 wks and new onset dysarthria and some aphasia since earlier in the day prior to admission. He was also noted to have lateral gaze impairment of the R eye which is baseline.   Stroke workup this admission:  CTA H&N 1. 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 This could provide a source of emboli into the left MCA. 2. 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 3. 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  MRI brain wo contrast 1. Large area of mixed acute/subacute ischemia within the left MCA territory. 2. Petechial hemorrhage at the infarct site with mild cytotoxic edema. Heidelberg classification 1a: HI1, scattered small petechiae, no mass effect.  TTE - no visualized intracardiac clot  1. Left ventricular ejection fraction, by estimation, is 40 to 45%. The  left ventricle has mildly decreased function. Left ventricular endocardial  border not optimally defined to evaluate regional wall motion. There is  mild left ventricular hypertrophy.   Left ventricular diastolic parameters are consistent with Grade I  diastolic dysfunction (impaired relaxation).   2. Right ventricular systolic function is normal. The right ventricular  size is normal.   3. The mitral valve is normal in structure. No evidence of mitral valve  regurgitation. No evidence of mitral stenosis.   4. The aortic valve is normal in structure. Aortic valve regurgitation is  not visualized. Mild aortic valve sclerosis is present, with no evidence  of aortic valve stenosis.   5. The inferior vena cava is normal in size with greater than 50%  respiratory  variability, suggesting right atrial pressure of 3 mmHg.   LDL 120 a1c 5.5  Due to petechial hemorrhage patient was started on ASA 81mg  daily monoterhapy and not DAPT (as he typically would be in the setting of severe intracranial stenosis). His exam is somewhat improved though he still has residual RUE weakness and dysarthria  Recommendations - Stroke w/u complete, no current indication to transfer patient to [facility] - Repeat head CT wo contrast to ensure stability of petechial hemorrhage prior to hospital discharge - ASA 81mg  daily - Atorvastatin 80mg  daily - PT/OT/SLP - I will arrange outpatient neurology f/u  Dr. Roderic Palau will page me if there are any concerning findings on repeat head CT or if any new neurologic concerns arise  Su Monks, MD [facility] [phone]  If 7pm- 7am, please page neurology on call as listed in [place].

## 2021-08-20 NOTE — Progress Notes (Signed)
Occupational Therapy Treatment Patient Details Name: Todd Estrada MRN: [id number] DOB: [birth year] Today's Date: 08/20/2021    History of present illness Franciscojavier Wronski  is a 60 y.o. male, with history of hypertension and tobacco abuse presents ED with a chief complaint of right upper extremity weakness and dysarthria.   OT comments  Treatment focused on placing wrist splint - showing patient how to don and doff and when to wear - as well as exercises for hand using squeeze ball and putty. Patient shown how to passively stretch fingers as well. Patient demonstrated ability to walk to bathroom and don shoes. Patient has difficulty with ADLs due to not being able to use right hand but otherwise functional. Therapist recommends OP OT for hand but if patient unable to get to outpatient clinic or patient not willing - recommend [facility] OT    Follow Up Recommendations  Outpatient OT;Home health OT    Equipment Recommendations  None recommended by OT    Recommendations for Other Services      Precautions / Restrictions Precautions Precautions: None Precaution Comments: right wrist splint Restrictions Weight Bearing Restrictions: No       Mobility Bed Mobility Overal bed mobility: Modified Independent                  Transfers Overall transfer level: Needs assistance Equipment used: None Transfers: Sit to/from Stand Sit to Stand: Supervision         General transfer comment: Patient able to ambulate in to bathroom and back to bed without overt loss of balance.    Balance Overall balance assessment: Mild deficits observed, not formally tested Sitting-balance support: Feet supported;No upper extremity supported Sitting balance-Leahy Scale: Good       Standing balance-Leahy Scale: Fair Standing balance comment: requires UE  for dynamic tasks, assist for wt shifting outside BOS             High level balance activites: Head turns;Turns High Level Balance Comments: min  assist for above           ADL either performed or assessed with clinical judgement   ADL                                         General ADL Comments: Patient able to don shoes and wash face standing at the sink.     Vision Patient Visual Report: No change from baseline     Perception     Praxis      Cognition Arousal/Alertness: Awake/alert Behavior During Therapy: WFL for tasks assessed/performed Overall Cognitive Status: Impaired/Different from baseline Area of Impairment: Orientation;Following commands;Safety/judgement                 Orientation Level: Disoriented to;Place     Following Commands: Follows one step commands consistently;Follows multi-step commands with increased time Safety/Judgement: Decreased awareness of safety;Decreased awareness of deficits              Exercises Other Exercises Other Exercises: Patient provided with squeeze ball and yellow putty and shown how to peform PROM of fingers and how to use ball and putty. Other Exercises: wrist cock up placed on right wrist to stabilize wrist so patient can use more functionally. Patitent demonstrated ability to don and doff.   Shoulder Instructions       General Comments      Pertinent Vitals/ Pain  Pain Assessment: No/denies pain Faces Pain Scale: No hurt  Home Living Family/patient expects to be discharged to:: Private residence Living Arrangements: Other relatives Available Help at Discharge:  (lives with cousin and uncle, reports his uncle is in the hospital (?)) Type of Home: House Home Access: Level entry     Home Layout: One level               Home Equipment: None      Lives With: Family    Prior Functioning/Environment Level of Independence: Independent            Frequency  Min 2X/week        Progress Toward Goals  OT Goals(current goals can now be found in the care plan section)  Progress towards OT goals: Progressing  toward goals  Acute Rehab OT Goals Patient Stated Goal: improve right hand and go home OT Goal Formulation: With patient Time For Goal Achievement: 09/02/21 Potential to Achieve Goals: Good  Plan Discharge plan remains appropriate    Co-evaluation                 AM-PAC OT "6 Clicks" Daily Activity     Outcome Measure   Help from another person eating meals?: A Little Help from another person taking care of personal grooming?: None Help from another person toileting, which includes using toliet, bedpan, or urinal?: None Help from another person bathing (including washing, rinsing, drying)?: None Help from another person to put on and taking off regular upper body clothing?: None Help from another person to put on and taking off regular lower body clothing?: A Little 6 Click Score: 22    End of Session    OT Visit Diagnosis: Hemiplegia and hemiparesis Hemiplegia - Right/Left: Right Hemiplegia - dominant/non-dominant: Dominant Hemiplegia - caused by: Cerebral infarction   Activity Tolerance Patient tolerated treatment well   Patient Left in bed;with call bell/phone within reach   Nurse Communication Mobility status (splint placed)        Time: 6269-4854 OT Time Calculation (min): 12 min  Charges: OT General Charges $OT Visit: 1 Visit OT Treatments $Therapeutic Exercise: 8-22 mins  Derl Barrow, OTR/L [place]  Office [phone] Pager: [place] 08/20/2021, 4:48 PM

## 2021-08-20 NOTE — Progress Notes (Signed)
Orthopedic Tech Progress Note Patient Details:  Todd Estrada 22-May-1961 [id number]  Patient ID: Todd Estrada, male   DOB: [birth year], 60 y.o.   MRN: [id number]  [name] 08/20/2021, 2:46 PM Wrist splint delivered to room. Rn aware. Pt in CT

## 2021-08-21 LAB — BASIC METABOLIC PANEL
Anion gap: 8 (ref 5–15)
BUN: 11 mg/dL (ref 8–23)
CO2: 27 mmol/L (ref 22–32)
Calcium: 9.3 mg/dL (ref 8.9–10.3)
Chloride: 100 mmol/L (ref 98–111)
Creatinine, Ser: 0.79 mg/dL (ref 0.61–1.24)
GFR, Estimated: >60 mL/min (ref >60)
Glucose, Bld: 89 mg/dL (ref 70–99)
Potassium: 4 mmol/L (ref 3.5–5.1)
Sodium: 135 mmol/L (ref 135–145)

## 2021-08-21 LAB — GLUCOSE, CAPILLARY
Glucose-Capillary: 103 mg/dL — ABNORMAL HIGH (ref 70–99)
Glucose-Capillary: 106 mg/dL — ABNORMAL HIGH (ref 70–99)
Glucose-Capillary: 132 mg/dL — ABNORMAL HIGH (ref 70–99)

## 2021-08-21 LAB — MAGNESIUM: Magnesium: 2.2 mg/dL (ref 1.7–2.4)

## 2021-08-21 MED ORDER — ASPIRIN 81 MG PO TBEC: 81.0000 mg | DELAYED_RELEASE_TABLET | Freq: Every day | ORAL | 11 refills | Status: DC

## 2021-08-21 MED ORDER — METOPROLOL TARTRATE 25 MG PO TABS: 12.5000 mg | ORAL_TABLET | Freq: Two times a day (BID) | ORAL | 1 refills | Status: DC

## 2021-08-21 MED ORDER — ATORVASTATIN CALCIUM 80 MG PO TABS: 80.0000 mg | ORAL_TABLET | Freq: Every day | ORAL | 1 refills | Status: DC

## 2021-08-21 NOTE — TOC Transition Note (Addendum)
Transition of Care [facility]) - CM/SW Discharge Note   Patient Details  Name: Todd Estrada MRN: [id number] Date of Birth: 02-02-1961  [facility] CM/SW Contact:  Nalini Alcaraz, Marta Lamas, LCSW Phone Number: 08/21/2021, 10:08 AM   Clinical Narrative:     Patient is medically stable and ready for discharge home today.  Patient denied having a preference as to which home health agency he would like to use.  LCSW outreached to 9 different home health agencies, but none agreed to accept the referral due to patient's payor source, [place] [employer] [place] [employer] [place].  Patient and physician notified.  AddendumDarrick Estrada with [facility] (# [phone]) has agreed to accept patient's payor source and home health physical therapy services will begin on 08/25/2021.     Patient Goals and CMS Choice    Discharge Placement    N/A     Discharge Plan and [place] with [place] for [place].  Social Determinants of Health (SDOH) Interventions     Readmission Risk Interventions No flowsheet data found.  Nat Christen, BSW, MSW, [employer]  Licensed [employer] representative  [employer]  Mailing Address-[address], [place], [place] [address] Physical Address-[address] [place], [place] [address] Toll Free Main # [phone] Fax # [phone] Cell # [phone]  [email or web address]

## 2021-08-21 NOTE — Discharge Summary (Signed)
Physician Discharge Summary  Todd Estrada YBO:[id number] DOB: 03/09/1961 DOA: 08/18/2021  PCP: Pcp, No  Admit date: 08/18/2021 Discharge date: 08/21/2021  Admitted From: Home Disposition: Home  Recommendations for Outpatient Follow-up:  Follow up with PCP in 1-2 weeks Please obtain BMP/CBC in one week Referral for outpatient neurology follow-up in place Referral for outpatient cardiology follow-up in place  Home Health: Home health PT Equipment/Devices:  Discharge Condition: Stable CODE STATUS: Full code Diet recommendation: Heart healthy  Brief/Interim Summary: 60 year old male admitted to the hospital with 2-week history of right hand weakness.  He subsequently developed dysarthria/aphasia and came to the ER for evaluation where he was noted to have a left MCA infarct.  He was admitted for further treatments.  Discharge Diagnoses:  Active Problems:   CVA (cerebral vascular accident) (Experiment)   Tobacco use disorder   Essential hypertension  Acute/subacute CVA -Noted to have left MCA CVA -MRI imaging and did comment on small petechial hemorrhage -Seen by tele neurology and also case was also reviewed with Dr. 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 -Management would be antiplatelets at this point -Started on statin for elevated LDL of 120 -A1c of 5.5 -Seen by PT/OT with recommendations for home health PT -Echo did not show any source of cardiac embolus -He was recommended to repeat CT head today to ensure that petechial hemorrhage is stable -Follow-up CT did not show any significant change   Chronic combined CHF -EF of 40 to 45% with grade 1 diastolic dysfunction on echocardiogram -Suspect is related to hypertension -Patient will need referral to cardiology for further work-up -With his recent stroke, I do not suspect he would be a candidate for any  invasive work-up at this point. -Continue aspirin, statin   Hypertension -Started on metoprolol  Discharge Instructions  Discharge Instructions     Ambulatory referral to Neurology   Complete by: As directed    An appointment is requested in approximately: 2-4 wks   Diet - low sodium heart healthy   Complete by: As directed    Increase activity slowly   Complete by: As directed       Allergies as of 08/21/2021   No Known Allergies      Medication List     TAKE these medications    aspirin 81 MG EC tablet Take 1 tablet (81 mg total) by mouth daily. Swallow whole. What changed:  medication strength how much to take additional instructions   atorvastatin 80 MG tablet Commonly known as: LIPITOR Take 1 tablet (80 mg total) by mouth daily.   metoprolol tartrate 25 MG tablet Commonly known as: LOPRESSOR Take 0.5 tablets (12.5 mg total) by mouth 2 (two) times daily.        No Known Allergies  Consultations: Neurology    Procedures/Studies: CT Angio Head W or Wo Contrast  Result Date: 08/18/2021 CLINICAL DATA:  Right upper extremity weakness EXAM: CT ANGIOGRAPHY HEAD AND NECK TECHNIQUE: Multidetector CT imaging of the head and neck was performed using the standard protocol during bolus administration of intravenous contrast. Multiplanar CT image reconstructions and MIPs were obtained to evaluate the vascular anatomy. Carotid stenosis measurements (when applicable) are obtained utilizing NASCET criteria, using the distal internal carotid diameter as the denominator. CONTRAST:  70mL OMNIPAQUE IOHEXOL 350 MG/ML SOLN COMPARISON:  None. FINDINGS: CTA NECK FINDINGS SKELETON: There is no bony spinal canal stenosis. No  lytic or blastic lesion. OTHER NECK: Normal pharynx, larynx and major salivary glands. No cervical lymphadenopathy. Unremarkable thyroid gland. UPPER CHEST: Emphysema AORTIC ARCH: There is calcific atherosclerosis of the aortic arch. There is no aneurysm,  dissection or hemodynamically significant stenosis of the visualized portion of the aorta. Conventional 3 vessel aortic branching pattern. The visualized proximal subclavian arteries are widely patent. RIGHT CAROTID SYSTEM: Normal without aneurysm, dissection or stenosis. LEFT CAROTID SYSTEM: Normal without aneurysm, dissection or stenosis. VERTEBRAL ARTERIES: Right dominant configuration. The left vertebral artery is occluded along its entire length. Right vertebral artery is normal. CTA HEAD FINDINGS POSTERIOR CIRCULATION: --Vertebral arteries: Normal V4 segments. --Inferior cerebellar arteries: Normal. --Basilar artery: Normal. --Superior cerebellar arteries: Normal. --Posterior cerebral arteries (PCA): Normal. ANTERIOR CIRCULATION: --Intracranial internal carotid arteries: Low-density plaque within the clinoid segment of the left ICA causing severe stenosis. There is mild bilateral calcification. --Anterior cerebral arteries (ACA): Normal. Both A1 segments are present. Patent anterior communicating artery (a-comm). --Middle cerebral arteries (MCA): Normal. VENOUS SINUSES: As permitted by contrast timing, patent. 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 Review of the MIP images confirms the above findings. IMPRESSION: 1. 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 This could provide a source of emboli into the left MCA. 2. 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 3. 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 Aortic Atherosclerosis (ICD10-I70.0) and Emphysema (ICD10-J43.9). Electronically Signed   By: Ulyses Jarred M.D.   On: 08/18/2021 22:13   CT HEAD WO CONTRAST (5MM)  Result Date: 08/20/2021 CLINICAL DATA:  Stroke, follow-up; follow-up petechial hemorrhage. EXAM: CT  HEAD WITHOUT CONTRAST TECHNIQUE: Contiguous axial images were obtained from the base of the skull through the vertex without intravenous contrast. COMPARISON:  Prior head CT examination 08/18/2021. Brain MRI 08/18/2021. CT angiogram head/neck 08/18/2021. FINDINGS: Brain: Mild generalized cerebral and cerebellar atrophy. Redemonstrated large evolving region of acute on subacute infarction within the left MCA vascular territory. Mild petechial hemorrhage at this site has not appreciably changed from the brain MRI of 08/18/2021. Stable background chronic small-vessel ischemic changes within the cerebral white matter. Redemonstrated chronic lacunar infarct within the left basal ganglia. Redemonstrated chronic infarcts within the left cerebellar hemisphere. No extra-axial fluid collection. No evidence of an intracranial mass. No midline shift. Vascular: Unchanged hyperdensity along the anterior aspect of the left lateral ventricle consistent with previously demonstrated large developmental venous anomaly. Arterial atherosclerotic calcifications. Skull: Normal. Negative for fracture or focal lesion. Sinuses/Orbits: Visualized orbits show no acute finding. Extensive partial opacification of an asymmetrically diminutive right maxillary sinus. No more than mild mucosal thickening elsewhere within the paranasal sinuses. Other: Small bilateral mastoid effusions. IMPRESSION: Redemonstrated large evolving region of acute on subacute infarction within the left MCA vascular territory. Mild petechial hemorrhage at this site has not appreciably changed from the brain MRI of 08/18/2021. This remains Heidelberg classification 1a: HI1, scattered small petechiae, no mass effect. Otherwise stable noncontrast CT appearance of the brain with sites of chronic ischemia and chronic infarcts, as detailed. Mild generalized cerebral atrophy. Paranasal sinus disease, most notably severe right maxillary sinusitis. Small bilateral mastoid  effusions. Electronically Signed   By: Kellie Simmering D.O.   On: 08/20/2021 16:39   CT HEAD WO CONTRAST (5MM)  Result Date: 08/18/2021 CLINICAL DATA:  Trouble speaking right-sided weakness EXAM: CT HEAD WITHOUT CONTRAST TECHNIQUE:  Contiguous axial images were obtained from the base of the skull through the vertex without intravenous contrast. COMPARISON:  None. FINDINGS: Brain: No hemorrhage or intracranial is visualized. Hypodensity within the left parietal lobe and left cerebellum. Mild atrophy. Patchy white matter hypodensity consistent with chronic small vessel ischemic change. Vascular: Prominent vessel at the anterior left lateral ventricle could be related to developmental venous anomaly. Skull: No fracture Sinuses/Orbits: Small fluid in the left greater than right mastoid. Opacified right maxillary sinus Other: None IMPRESSION: 1. Moderate hypodensity within the left parietal lobe with moderate hypodensity in the left cerebellum, consistent with age indeterminate infarcts; appearance suggests subacute to developing chronic infarcts though given history, correlation with MRI should be considered. No hemorrhage identified 2. Atrophy with mild chronic small vessel ischemic changes of the white matter Electronically Signed   By: Donavan Foil M.D.   On: 08/18/2021 19:21   CT ANGIO NECK W OR WO CONTRAST  Result Date: 08/18/2021 CLINICAL DATA:  Right upper extremity weakness EXAM: CT ANGIOGRAPHY HEAD AND NECK TECHNIQUE: Multidetector CT imaging of the head and neck was performed using the standard protocol during bolus administration of intravenous contrast. Multiplanar CT image reconstructions and MIPs were obtained to evaluate the vascular anatomy. Carotid stenosis measurements (when applicable) are obtained utilizing NASCET criteria, using the distal internal carotid diameter as the denominator. CONTRAST:  6mL OMNIPAQUE IOHEXOL 350 MG/ML SOLN COMPARISON:  None. FINDINGS: CTA NECK FINDINGS SKELETON: There  is no bony spinal canal stenosis. No lytic or blastic lesion. OTHER NECK: Normal pharynx, larynx and major salivary glands. No cervical lymphadenopathy. Unremarkable thyroid gland. UPPER CHEST: Emphysema AORTIC ARCH: There is calcific atherosclerosis of the aortic arch. There is no aneurysm, dissection or hemodynamically significant stenosis of the visualized portion of the aorta. Conventional 3 vessel aortic branching pattern. The visualized proximal subclavian arteries are widely patent. RIGHT CAROTID SYSTEM: Normal without aneurysm, dissection or stenosis. LEFT CAROTID SYSTEM: Normal without aneurysm, dissection or stenosis. VERTEBRAL ARTERIES: Right dominant configuration. The left vertebral artery is occluded along its entire length. Right vertebral artery is normal. CTA HEAD FINDINGS POSTERIOR CIRCULATION: --Vertebral arteries: Normal V4 segments. --Inferior cerebellar arteries: Normal. --Basilar artery: Normal. --Superior cerebellar arteries: Normal. --Posterior cerebral arteries (PCA): Normal. ANTERIOR CIRCULATION: --Intracranial internal carotid arteries: Low-density plaque within the clinoid segment of the left ICA causing severe stenosis. There is mild bilateral calcification. --Anterior cerebral arteries (ACA): Normal. Both A1 segments are present. Patent anterior communicating artery (a-comm). --Middle cerebral arteries (MCA): Normal. VENOUS SINUSES: As permitted by contrast timing, patent. 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 Review of the MIP images confirms the above findings. IMPRESSION: 1. 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 This could provide a source of emboli into the left MCA. 2. 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 3. Large left periventricular developmental venous anomaly that drains into the  left internal cerebral vein. Aortic Atherosclerosis (ICD10-I70.0) and Emphysema (ICD10-J43.9). Electronically Signed   By: Ulyses Jarred M.D.   On: 08/18/2021 22:13   MR Brain Wo Contrast (neuro protocol)  Result Date: 08/18/2021 CLINICAL DATA:  Right upper extremity weakness with slurred speech EXAM: MRI HEAD WITHOUT CONTRAST TECHNIQUE: Multiplanar, multiecho pulse sequences of the brain and surrounding structures were obtained without intravenous contrast. COMPARISON:  None. FINDINGS: Brain: Large area of acute ischemia within the posterior left MCA territory. More anteriorly, there is  a region that appears to be more subacute. There is petechial hemorrhage at the infarct site with mild cytotoxic edema. There is multifocal hyperintense T2-weighted signal within the white matter. Generalized volume loss without a clear lobar predilection. The midline structures are normal. Vascular: Major flow voids are preserved. Skull and upper cervical spine: Normal calvarium and skull base. Visualized upper cervical spine and soft tissues are normal. Sinuses/Orbits:Right maxillary sinus filling with bilateral mastoid effusions. Normal orbits. IMPRESSION: 1. Large area of mixed acute/subacute ischemia within the left MCA territory. 2. Petechial hemorrhage at the infarct site with mild cytotoxic edema. Heidelberg classification 1a: HI1, scattered small petechiae, no mass effect. Electronically Signed   By: Ulyses Jarred M.D.   On: 08/18/2021 20:39   ECHOCARDIOGRAM COMPLETE  Result Date: 08/19/2021    ECHOCARDIOGRAM REPORT   Patient Name:   Todd Estrada Date of Exam: 08/19/2021 Medical Rec #:  [id number]     Height:       72.0 in Accession #:    [id number]    Weight:       195.0 lb Date of Birth:  1961/09/14     BSA:          2.108 m Patient Age:    60 years      BP:           149/95 mmHg Patient Gender: M             HR:           76 bpm. Exam Location:  Inpatient Procedure: 2D Echo, Cardiac Doppler, Color Doppler and  Intracardiac            Opacification Agent Indications:    CVA  History:        Patient has no prior history of Echocardiogram examinations.                 COPD and Stroke, Arrythmias:Tachycardia; Risk                 Factors:Hypertension and Current Smoker.  Sonographer:    Dustin Flock RDCS Referring Phys: [id number] ASIA B [place]  Sonographer Comments: Technically difficult study due to poor echo windows. Image acquisition challenging due to COPD. IMPRESSIONS  1. Left ventricular ejection fraction, by estimation, is 40 to 45%. The left ventricle has mildly decreased function. Left ventricular endocardial border not optimally defined to evaluate regional wall motion. There is mild left ventricular hypertrophy.  Left ventricular diastolic parameters are consistent with Grade I diastolic dysfunction (impaired relaxation).  2. Right ventricular systolic function is normal. The right ventricular size is normal.  3. The mitral valve is normal in structure. No evidence of mitral valve regurgitation. No evidence of mitral stenosis.  4. The aortic valve is normal in structure. Aortic valve regurgitation is not visualized. Mild aortic valve sclerosis is present, with no evidence of aortic valve stenosis.  5. The inferior vena cava is normal in size with greater than 50% respiratory variability, suggesting right atrial pressure of 3 mmHg. Conclusion(s)/Recommendation(s): No intracardiac source of embolism detected on this transthoracic study. A transesophageal echocardiogram is recommended to exclude cardiac source of embolism if clinically indicated. FINDINGS  Left Ventricle: Left ventricular ejection fraction, by estimation, is 40 to 45%. The left ventricle has mildly decreased function. Left ventricular endocardial border not optimally defined to evaluate regional wall motion. Definity contrast agent was given IV to delineate the left ventricular endocardial borders. The left ventricular internal cavity size  was normal  in size. There is mild left ventricular hypertrophy. Left ventricular diastolic parameters are consistent with Grade I diastolic dysfunction (impaired relaxation). Right Ventricle: The right ventricular size is normal. No increase in right ventricular wall thickness. Right ventricular systolic function is normal. Left Atrium: Left atrial size was normal in size. Right Atrium: Right atrial size was normal in size. Pericardium: There is no evidence of pericardial effusion. Mitral Valve: The mitral valve is normal in structure. No evidence of mitral valve regurgitation. No evidence of mitral valve stenosis. Tricuspid Valve: The tricuspid valve is normal in structure. Tricuspid valve regurgitation is not demonstrated. No evidence of tricuspid stenosis. Aortic Valve: The aortic valve is normal in structure. Aortic valve regurgitation is not visualized. Mild aortic valve sclerosis is present, with no evidence of aortic valve stenosis. Pulmonic Valve: The pulmonic valve was normal in structure. Pulmonic valve regurgitation is not visualized. No evidence of pulmonic stenosis. Aorta: The aortic root is normal in size and structure. Venous: The inferior vena cava is normal in size with greater than 50% respiratory variability, suggesting right atrial pressure of 3 mmHg. IAS/Shunts: No atrial level shunt detected by color flow Doppler.  LEFT VENTRICLE PLAX 2D LVIDd:         5.30 cm Diastology LVIDs:         4.90 cm LV e' medial:    4.57 cm/s LV PW:         1.20 cm LV E/e' medial:  13.3 LV IVS:        1.20 cm LV e' lateral:   4.24 cm/s                        LV E/e' lateral: 14.3  RIGHT VENTRICLE RV Basal diam:  2.80 cm RV S prime:     12.50 cm/s TAPSE (M-mode): 2.3 cm LEFT ATRIUM           Index       RIGHT ATRIUM           Index LA diam:      4.20 cm 1.99 cm/m  RA Area:     11.80 cm LA Vol (A2C): 65.6 ml 31.12 ml/m RA Volume:   27.20 ml  12.90 ml/m LA Vol (A4C): 23.6 ml 11.20 ml/m  AORTIC VALVE LVOT Vmax:    94.60 cm/s LVOT Vmean:  61.100 cm/s LVOT VTI:    0.145 m  AORTA Ao Root diam: 3.60 cm MITRAL VALVE MV Area (PHT): 6.71 cm     SHUNTS MV Decel Time: 113 msec     Systemic VTI: 0.14 m MV E velocity: 60.60 cm/s MV A velocity: 102.00 cm/s MV E/A ratio:  0.59 Candee Furbish MD Electronically signed by Candee Furbish MD Signature Date/Time: 08/19/2021/3:23:25 PM    Final       Subjective: Feels well, no new complaints  Discharge Exam: Vitals:   08/20/21 0633 08/20/21 1355 08/20/21 2055 08/21/21 0620  BP: (!) 150/87 (!) 158/95 (!) 143/92 (!) 139/101  Pulse: 83 83 89 74  Resp: 16 20 16 18   Temp: 97.6 F (36.4 C) 98.3 F (36.8 C) 99.3 F (37.4 C) 98.3 F (36.8 C)  TempSrc: Oral Oral Oral Oral  SpO2:    96%  Weight:      Height:        General: Pt is alert, awake, not in acute distress Cardiovascular: RRR, S1/S2 +, no rubs, no gallops Respiratory: CTA bilaterally, no wheezing, no rhonchi Abdominal: Soft, NT,  ND, bowel sounds + Extremities: no edema, no cyanosis    The results of significant diagnostics from this hospitalization (including imaging, microbiology, ancillary and laboratory) are listed below for reference.     Microbiology: Recent Results (from the past 240 hour(s))  Resp Panel by RT-PCR (Flu A&B, Covid) Nasopharyngeal Swab     Status: None   Collection Time: 08/18/21  7:34 PM   Specimen: Nasopharyngeal Swab; Nasopharyngeal(NP) swabs in vial transport medium  Result Value Ref Range Status   SARS Coronavirus 2 by RT PCR NEGATIVE NEGATIVE Final    Comment: (NOTE) SARS-CoV-2 target nucleic acids are NOT DETECTED.  The SARS-CoV-2 RNA is generally detectable in upper respiratory specimens during the acute phase of infection. The lowest concentration of SARS-CoV-2 viral copies this assay can detect is 138 copies/mL. A negative result does not preclude SARS-Cov-2 infection and should not be used as the sole basis for treatment or other patient management decisions. A negative  result may occur with  improper specimen collection/handling, submission of specimen other than nasopharyngeal swab, presence of viral mutation(s) within the areas targeted by this assay, and inadequate number of viral copies(<138 copies/mL). A negative result must be combined with clinical observations, patient history, and epidemiological information. The expected result is Negative.  Fact Sheet for Patients:  EntrepreneurPulse.com.au  Fact Sheet for Healthcare Providers:  IncredibleEmployment.be  This test is no t yet approved or cleared by the [place] FDA and  has been authorized for detection and/or diagnosis of SARS-CoV-2 by FDA under an Emergency Use Authorization (EUA). This EUA will remain  in effect (meaning this test can be used) for the duration of the COVID-19 declaration under Section 564(b)(1) of the Act, 21 U.S.C.section 360bbb-3(b)(1), unless the authorization is terminated  or revoked sooner.       Influenza A by PCR NEGATIVE NEGATIVE Final   Influenza B by PCR NEGATIVE NEGATIVE Final    Comment: (NOTE) The Xpert Xpress SARS-CoV-2/FLU/RSV plus assay is intended as an aid in the diagnosis of influenza from Nasopharyngeal swab specimens and should not be used as a sole basis for treatment. Nasal washings and aspirates are unacceptable for Xpert Xpress SARS-CoV-2/FLU/RSV testing.  Fact Sheet for Patients: EntrepreneurPulse.com.au  Fact Sheet for Healthcare Providers: IncredibleEmployment.be  This test is not yet approved or cleared by the [place] FDA and has been authorized for detection and/or diagnosis of SARS-CoV-2 by FDA under an Emergency Use Authorization (EUA). This EUA will remain in effect (meaning this test can be used) for the duration of the COVID-19 declaration under Section 564(b)(1) of the Act, 21 U.S.C. section 360bbb-3(b)(1), unless the authorization is  terminated or revoked.  Performed at [facility], [place] [address], [place],  [address]      Labs: BNP (last 3 results) No results for input(s): BNP in the last 8760 hours. Basic Metabolic Panel: Recent Labs  Lab 08/18/21 1818 08/18/21 1827 08/19/21 1416 08/20/21 0401 08/21/21 0407  NA 145 141 138 136 135  K 4.5 4.3 4.6 3.9 4.0  CL 106 103 104 102 100  CO2 27  --  24 26 27   GLUCOSE 115* 118* 115* 102* 89  BUN 13 13 7* 6* 11  CREATININE 1.03 1.00 0.63 0.75 0.79  CALCIUM 9.7  --  9.2 9.2 9.3  MG  --   --   --  1.8 2.2   Liver Function Tests: Recent Labs  Lab 08/18/21 1818 08/19/21 1416  AST 15 26  ALT 13 12  ALKPHOS 86 70  BILITOT 0.6 1.6*  PROT 7.4 7.4  ALBUMIN 3.8 3.7   No results for input(s): LIPASE, AMYLASE in the last 168 hours. No results for input(s): AMMONIA in the last 168 hours. CBC: Recent Labs  Lab 08/18/21 1818 08/18/21 1827 08/19/21 0748  WBC 8.8  --  8.2  NEUTROABS 5.9  --   --   HGB 18.2* 18.7* 17.6*  HCT 54.8* 55.0* 53.5*  MCV 92.9  --  93.2  PLT 250  --  192   Cardiac Enzymes: No results for input(s): CKTOTAL, CKMB, CKMBINDEX, TROPONINI in the last 168 hours. BNP: Invalid input(s): POCBNP CBG: Recent Labs  Lab 08/20/21 1226 08/20/21 1804 08/21/21 0008 08/21/21 0651 08/21/21 1139  GLUCAP 128* 126* 103* 106* 132*   D-Dimer No results for input(s): DDIMER in the last 72 hours. Hgb A1c Recent Labs    08/19/21 0749  HGBA1C 5.5   Lipid Profile Recent Labs    08/19/21 1416  CHOL 172  HDL 35*  LDLCALC 120*  TRIG 85  CHOLHDL 4.9   Thyroid function studies No results for input(s): TSH, T4TOTAL, T3FREE, THYROIDAB in the last 72 hours.  Invalid input(s): FREET3 Anemia work up No results for input(s): VITAMINB12, FOLATE, FERRITIN, TIBC, IRON, RETICCTPCT in the last 72 hours. Urinalysis    Component Value Date/Time   COLORURINE YELLOW 08/19/2021 0748   APPEARANCEUR CLEAR (A) 08/19/2021 0748    LABSPEC 1.020 08/19/2021 0748   PHURINE 6.0 08/19/2021 0748   GLUCOSEU NEGATIVE 08/19/2021 0748   HGBUR NEGATIVE 08/19/2021 0748   BILIRUBINUR SMALL (A) 08/19/2021 0748   KETONESUR NEGATIVE 08/19/2021 0748   PROTEINUR 100 (A) 08/19/2021 0748   NITRITE NEGATIVE 08/19/2021 0748   LEUKOCYTESUR NEGATIVE 08/19/2021 0748   Sepsis Labs Invalid input(s): PROCALCITONIN,  WBC,  LACTICIDVEN Microbiology Recent Results (from the past 240 hour(s))  Resp Panel by RT-PCR (Flu A&B, Covid) Nasopharyngeal Swab     Status: None   Collection Time: 08/18/21  7:34 PM   Specimen: Nasopharyngeal Swab; Nasopharyngeal(NP) swabs in vial transport medium  Result Value Ref Range Status   SARS Coronavirus 2 by RT PCR NEGATIVE NEGATIVE Final    Comment: (NOTE) SARS-CoV-2 target nucleic acids are NOT DETECTED.  The SARS-CoV-2 RNA is generally detectable in upper respiratory specimens during the acute phase of infection. The lowest concentration of SARS-CoV-2 viral copies this assay can detect is 138 copies/mL. A negative result does not preclude SARS-Cov-2 infection and should not be used as the sole basis for treatment or other patient management decisions. A negative result may occur with  improper specimen collection/handling, submission of specimen other than nasopharyngeal swab, presence of viral mutation(s) within the areas targeted by this assay, and inadequate number of viral copies(<138 copies/mL). A negative result must be combined with clinical observations, patient history, and epidemiological information. The expected result is Negative.  Fact Sheet for Patients:  EntrepreneurPulse.com.au  Fact Sheet for Healthcare Providers:  IncredibleEmployment.be  This test is no t yet approved or cleared by the [place] FDA and  has been authorized for detection and/or diagnosis of SARS-CoV-2 by FDA under an Emergency Use Authorization (EUA). This EUA will  remain  in effect (meaning this test can be used) for the duration of the COVID-19 declaration under Section 564(b)(1) of the Act, 21 U.S.C.section 360bbb-3(b)(1), unless the authorization is terminated  or revoked sooner.       Influenza A by PCR NEGATIVE NEGATIVE Final   Influenza B by PCR NEGATIVE  NEGATIVE Final    Comment: (NOTE) The Xpert Xpress SARS-CoV-2/FLU/RSV plus assay is intended as an aid in the diagnosis of influenza from Nasopharyngeal swab specimens and should not be used as a sole basis for treatment. Nasal washings and aspirates are unacceptable for Xpert Xpress SARS-CoV-2/FLU/RSV testing.  Fact Sheet for Patients: EntrepreneurPulse.com.au  Fact Sheet for Healthcare Providers: IncredibleEmployment.be  This test is not yet approved or cleared by the [place] FDA and has been authorized for detection and/or diagnosis of SARS-CoV-2 by FDA under an Emergency Use Authorization (EUA). This EUA will remain in effect (meaning this test can be used) for the duration of the COVID-19 declaration under Section 564(b)(1) of the Act, 21 U.S.C. section 360bbb-3(b)(1), unless the authorization is terminated or revoked.  Performed at [facility], [place] [address] [place], [place] [address]      Time coordinating discharge: 60mins  SIGNED:   Kathie Dike, MD  [facility] 08/21/2021, 9:04 PM   If 7PM-7AM, please contact night-coverage [email or web address]

## 2022-02-16 DIAGNOSIS — I639 Cerebral infarction, unspecified: Secondary | ICD-10-CM

## 2022-02-16 HISTORY — DX: Cerebral infarction, unspecified: I63.9

## 2022-03-02 ENCOUNTER — Encounter (HOSPITAL_COMMUNITY): Payer: Self-pay | Admitting: Emergency Medicine

## 2022-03-02 ENCOUNTER — Inpatient Hospital Stay (HOSPITAL_COMMUNITY): Payer: Medicaid Other | Admitting: Certified Registered"

## 2022-03-02 ENCOUNTER — Encounter (HOSPITAL_COMMUNITY)
Admission: EM | Disposition: A | Discharge status: Rehabilitation Facility | Payer: Self-pay | Source: Home / Self Care | Attending: Neurology

## 2022-03-02 ENCOUNTER — Other Ambulatory Visit: Payer: Self-pay

## 2022-03-02 ENCOUNTER — Emergency Department (HOSPITAL_COMMUNITY): Payer: Medicaid Other

## 2022-03-02 ENCOUNTER — Inpatient Hospital Stay (HOSPITAL_COMMUNITY)
Admission: EM | Admit: 2022-03-02 | Discharge: 2022-03-22 | DRG: 003 | Disposition: A | Discharge status: Rehabilitation Facility | Payer: Medicaid Other | Attending: Family Medicine | Admitting: Family Medicine

## 2022-03-02 DIAGNOSIS — I110 Hypertensive heart disease with heart failure: Secondary | ICD-10-CM | POA: Diagnosis present

## 2022-03-02 DIAGNOSIS — I6522 Occlusion and stenosis of left carotid artery: Secondary | ICD-10-CM | POA: Diagnosis present

## 2022-03-02 DIAGNOSIS — I69391 Dysphagia following cerebral infarction: Secondary | ICD-10-CM | POA: Diagnosis not present

## 2022-03-02 DIAGNOSIS — E785 Hyperlipidemia, unspecified: Secondary | ICD-10-CM | POA: Diagnosis present

## 2022-03-02 DIAGNOSIS — L0390 Cellulitis, unspecified: Secondary | ICD-10-CM | POA: Diagnosis not present

## 2022-03-02 DIAGNOSIS — Z0189 Encounter for other specified special examinations: Secondary | ICD-10-CM

## 2022-03-02 DIAGNOSIS — I639 Cerebral infarction, unspecified: Secondary | ICD-10-CM | POA: Diagnosis not present

## 2022-03-02 DIAGNOSIS — Z8673 Personal history of transient ischemic attack (TIA), and cerebral infarction without residual deficits: Secondary | ICD-10-CM | POA: Diagnosis not present

## 2022-03-02 DIAGNOSIS — I5042 Chronic combined systolic (congestive) and diastolic (congestive) heart failure: Secondary | ICD-10-CM | POA: Diagnosis present

## 2022-03-02 DIAGNOSIS — R040 Epistaxis: Secondary | ICD-10-CM | POA: Diagnosis not present

## 2022-03-02 DIAGNOSIS — D696 Thrombocytopenia, unspecified: Secondary | ICD-10-CM | POA: Diagnosis present

## 2022-03-02 DIAGNOSIS — I10 Essential (primary) hypertension: Secondary | ICD-10-CM | POA: Diagnosis not present

## 2022-03-02 DIAGNOSIS — Z430 Encounter for attention to tracheostomy: Secondary | ICD-10-CM | POA: Diagnosis not present

## 2022-03-02 DIAGNOSIS — R471 Dysarthria and anarthria: Secondary | ICD-10-CM | POA: Diagnosis present

## 2022-03-02 DIAGNOSIS — I63512 Cerebral infarction due to unspecified occlusion or stenosis of left middle cerebral artery: Secondary | ICD-10-CM

## 2022-03-02 DIAGNOSIS — I63232 Cerebral infarction due to unspecified occlusion or stenosis of left carotid arteries: Secondary | ICD-10-CM | POA: Diagnosis present

## 2022-03-02 DIAGNOSIS — R1312 Dysphagia, oropharyngeal phase: Secondary | ICD-10-CM | POA: Diagnosis not present

## 2022-03-02 DIAGNOSIS — A419 Sepsis, unspecified organism: Secondary | ICD-10-CM | POA: Diagnosis not present

## 2022-03-02 DIAGNOSIS — R6521 Severe sepsis with septic shock: Secondary | ICD-10-CM | POA: Diagnosis not present

## 2022-03-02 DIAGNOSIS — T17998A Other foreign object in respiratory tract, part unspecified causing other injury, initial encounter: Secondary | ICD-10-CM | POA: Diagnosis not present

## 2022-03-02 DIAGNOSIS — G9349 Other encephalopathy: Secondary | ICD-10-CM | POA: Diagnosis not present

## 2022-03-02 DIAGNOSIS — Z930 Tracheostomy status: Secondary | ICD-10-CM | POA: Diagnosis not present

## 2022-03-02 DIAGNOSIS — R609 Edema, unspecified: Secondary | ICD-10-CM | POA: Diagnosis not present

## 2022-03-02 DIAGNOSIS — R29810 Facial weakness: Secondary | ICD-10-CM | POA: Diagnosis present

## 2022-03-02 DIAGNOSIS — G8191 Hemiplegia, unspecified affecting right dominant side: Secondary | ICD-10-CM | POA: Diagnosis present

## 2022-03-02 DIAGNOSIS — I429 Cardiomyopathy, unspecified: Secondary | ICD-10-CM | POA: Diagnosis present

## 2022-03-02 DIAGNOSIS — I69320 Aphasia following cerebral infarction: Secondary | ICD-10-CM

## 2022-03-02 DIAGNOSIS — R569 Unspecified convulsions: Principal | ICD-10-CM

## 2022-03-02 DIAGNOSIS — J9601 Acute respiratory failure with hypoxia: Secondary | ICD-10-CM | POA: Diagnosis not present

## 2022-03-02 DIAGNOSIS — R001 Bradycardia, unspecified: Secondary | ICD-10-CM | POA: Diagnosis not present

## 2022-03-02 DIAGNOSIS — Z20822 Contact with and (suspected) exposure to covid-19: Secondary | ICD-10-CM | POA: Diagnosis present

## 2022-03-02 DIAGNOSIS — Z781 Physical restraint status: Secondary | ICD-10-CM

## 2022-03-02 DIAGNOSIS — F17200 Nicotine dependence, unspecified, uncomplicated: Secondary | ICD-10-CM | POA: Diagnosis not present

## 2022-03-02 DIAGNOSIS — Z716 Tobacco abuse counseling: Secondary | ICD-10-CM

## 2022-03-02 DIAGNOSIS — I161 Hypertensive emergency: Secondary | ICD-10-CM | POA: Diagnosis present

## 2022-03-02 DIAGNOSIS — E876 Hypokalemia: Secondary | ICD-10-CM | POA: Diagnosis present

## 2022-03-02 DIAGNOSIS — I959 Hypotension, unspecified: Secondary | ICD-10-CM | POA: Diagnosis not present

## 2022-03-02 DIAGNOSIS — F17210 Nicotine dependence, cigarettes, uncomplicated: Secondary | ICD-10-CM | POA: Diagnosis present

## 2022-03-02 DIAGNOSIS — G9341 Metabolic encephalopathy: Secondary | ICD-10-CM | POA: Diagnosis not present

## 2022-03-02 DIAGNOSIS — J9621 Acute and chronic respiratory failure with hypoxia: Secondary | ICD-10-CM | POA: Diagnosis not present

## 2022-03-02 DIAGNOSIS — E440 Moderate protein-calorie malnutrition: Secondary | ICD-10-CM | POA: Diagnosis present

## 2022-03-02 DIAGNOSIS — Z7982 Long term (current) use of aspirin: Secondary | ICD-10-CM

## 2022-03-02 DIAGNOSIS — K5900 Constipation, unspecified: Secondary | ICD-10-CM | POA: Diagnosis not present

## 2022-03-02 DIAGNOSIS — N390 Urinary tract infection, site not specified: Secondary | ICD-10-CM | POA: Diagnosis not present

## 2022-03-02 DIAGNOSIS — R339 Retention of urine, unspecified: Secondary | ICD-10-CM | POA: Diagnosis not present

## 2022-03-02 DIAGNOSIS — I69328 Other speech and language deficits following cerebral infarction: Secondary | ICD-10-CM

## 2022-03-02 DIAGNOSIS — I739 Peripheral vascular disease, unspecified: Secondary | ICD-10-CM | POA: Diagnosis not present

## 2022-03-02 DIAGNOSIS — R29718 NIHSS score 18: Secondary | ICD-10-CM | POA: Diagnosis present

## 2022-03-02 DIAGNOSIS — F1090 Alcohol use, unspecified, uncomplicated: Secondary | ICD-10-CM | POA: Diagnosis present

## 2022-03-02 DIAGNOSIS — L89312 Pressure ulcer of right buttock, stage 2: Secondary | ICD-10-CM | POA: Diagnosis present

## 2022-03-02 DIAGNOSIS — Z9289 Personal history of other medical treatment: Secondary | ICD-10-CM

## 2022-03-02 DIAGNOSIS — G40909 Epilepsy, unspecified, not intractable, without status epilepticus: Secondary | ICD-10-CM | POA: Diagnosis present

## 2022-03-02 DIAGNOSIS — Z978 Presence of other specified devices: Secondary | ICD-10-CM | POA: Diagnosis not present

## 2022-03-02 DIAGNOSIS — I808 Phlebitis and thrombophlebitis of other sites: Secondary | ICD-10-CM | POA: Diagnosis not present

## 2022-03-02 DIAGNOSIS — R579 Shock, unspecified: Secondary | ICD-10-CM | POA: Diagnosis not present

## 2022-03-02 DIAGNOSIS — R4781 Slurred speech: Secondary | ICD-10-CM | POA: Diagnosis present

## 2022-03-02 DIAGNOSIS — J690 Pneumonitis due to inhalation of food and vomit: Secondary | ICD-10-CM | POA: Diagnosis present

## 2022-03-02 DIAGNOSIS — J9611 Chronic respiratory failure with hypoxia: Secondary | ICD-10-CM | POA: Diagnosis not present

## 2022-03-02 DIAGNOSIS — I6502 Occlusion and stenosis of left vertebral artery: Secondary | ICD-10-CM | POA: Diagnosis present

## 2022-03-02 DIAGNOSIS — L8990 Pressure ulcer of unspecified site, unspecified stage: Secondary | ICD-10-CM | POA: Insufficient documentation

## 2022-03-02 DIAGNOSIS — Z79899 Other long term (current) drug therapy: Secondary | ICD-10-CM

## 2022-03-02 HISTORY — PX: IR ANGIO INTRA EXTRACRAN SEL COM CAROTID INNOMINATE UNI R MOD SED: IMG5359

## 2022-03-02 HISTORY — PX: IR CT HEAD LTD: IMG2386

## 2022-03-02 HISTORY — PX: RADIOLOGY WITH ANESTHESIA: SHX6223

## 2022-03-02 HISTORY — PX: IR PERCUTANEOUS ART THROMBECTOMY/INFUSION INTRACRANIAL INC DIAG ANGIO: IMG6087

## 2022-03-02 LAB — CBC WITH DIFFERENTIAL/PLATELET
Abs Immature Granulocytes: 0.03 10*3/uL (ref 0.00–0.07)
Basophils Absolute: 0 10*3/uL (ref 0.0–0.1)
Basophils Relative: 0 %
Eosinophils Absolute: 0 10*3/uL (ref 0.0–0.5)
Eosinophils Relative: 0 %
HCT: 52.2 % — ABNORMAL HIGH (ref 39.0–52.0)
Hemoglobin: 17.3 g/dL — ABNORMAL HIGH (ref 13.0–17.0)
Immature Granulocytes: 0 %
Lymphocytes Relative: 13 %
Lymphs Abs: 1.2 10*3/uL (ref 0.7–4.0)
MCH: 30.7 pg (ref 26.0–34.0)
MCHC: 33.1 g/dL (ref 30.0–36.0)
MCV: 92.7 fL (ref 80.0–100.0)
Monocytes Absolute: 0.8 10*3/uL (ref 0.1–1.0)
Monocytes Relative: 9 %
Neutro Abs: 7 10*3/uL (ref 1.7–7.7)
Neutrophils Relative %: 78 %
Platelets: 195 10*3/uL (ref 150–400)
RBC: 5.63 MIL/uL (ref 4.22–5.81)
RDW: 13.6 % (ref 11.5–15.5)
WBC: 9 10*3/uL (ref 4.0–10.5)
nRBC: 0 % (ref 0.0–0.2)

## 2022-03-02 LAB — COMPREHENSIVE METABOLIC PANEL
ALT: 22 U/L (ref 0–44)
AST: 31 U/L (ref 15–41)
Albumin: 3.6 g/dL (ref 3.5–5.0)
Alkaline Phosphatase: 58 U/L (ref 38–126)
Anion gap: 12 (ref 5–15)
BUN: 11 mg/dL (ref 8–23)
CO2: 22 mmol/L (ref 22–32)
Calcium: 8.9 mg/dL (ref 8.9–10.3)
Chloride: 101 mmol/L (ref 98–111)
Creatinine, Ser: 0.99 mg/dL (ref 0.61–1.24)
GFR, Estimated: >60 mL/min (ref >60)
Glucose, Bld: 97 mg/dL (ref 70–99)
Potassium: 4.5 mmol/L (ref 3.5–5.1)
Sodium: 135 mmol/L (ref 135–145)
Total Bilirubin: 1.4 mg/dL — ABNORMAL HIGH (ref 0.3–1.2)
Total Protein: 6.3 g/dL — ABNORMAL LOW (ref 6.5–8.1)

## 2022-03-02 LAB — I-STAT CHEM 8, ED
BUN: 13 mg/dL (ref 8–23)
Calcium, Ion: 1.03 mmol/L — ABNORMAL LOW (ref 1.15–1.40)
Chloride: 102 mmol/L (ref 98–111)
Creatinine, Ser: 0.9 mg/dL (ref 0.61–1.24)
Glucose, Bld: 99 mg/dL (ref 70–99)
HCT: 52 % (ref 39.0–52.0)
Hemoglobin: 17.7 g/dL — ABNORMAL HIGH (ref 13.0–17.0)
Potassium: 4.6 mmol/L (ref 3.5–5.1)
Sodium: 138 mmol/L (ref 135–145)
TCO2: 27 mmol/L (ref 22–32)

## 2022-03-02 LAB — ETHANOL: Alcohol, Ethyl (B): <10 mg/dL (ref <10)

## 2022-03-02 LAB — RESP PANEL BY RT-PCR (FLU A&B, COVID) ARPGX2
Influenza A by PCR: NEGATIVE
Influenza B by PCR: NEGATIVE
SARS Coronavirus 2 by RT PCR: NEGATIVE

## 2022-03-02 LAB — CBG MONITORING, ED: Glucose-Capillary: 121 mg/dL — ABNORMAL HIGH (ref 70–99)

## 2022-03-02 IMAGING — CT CT CERVICAL SPINE W/O CM
3 of 4 series · 12 of 33 positions shown, 14 images · non-contrast
Comparison: None.

CLINICAL DATA: Seizure like activity, history of alcohol abuse,
neck trauma



[Series 8: sag bone · sagittal · 0.28mm/px · 5 of 71 slices shown, 6 images]
[im 24/71  bone]
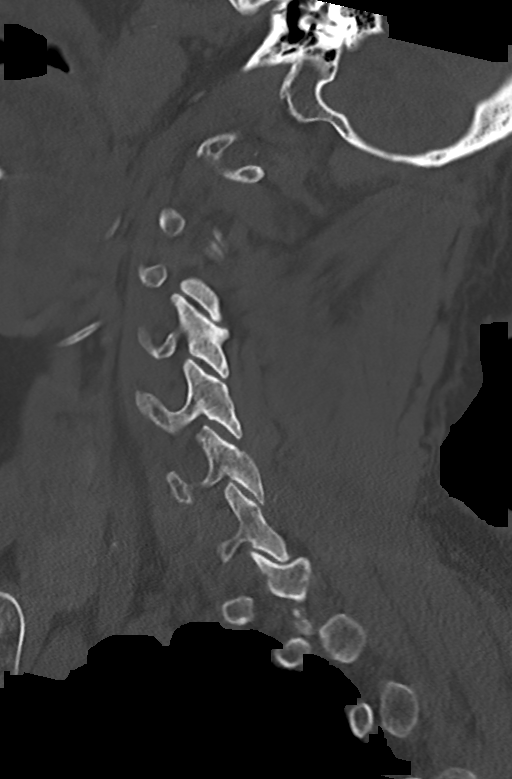
[im 30/71  bone]
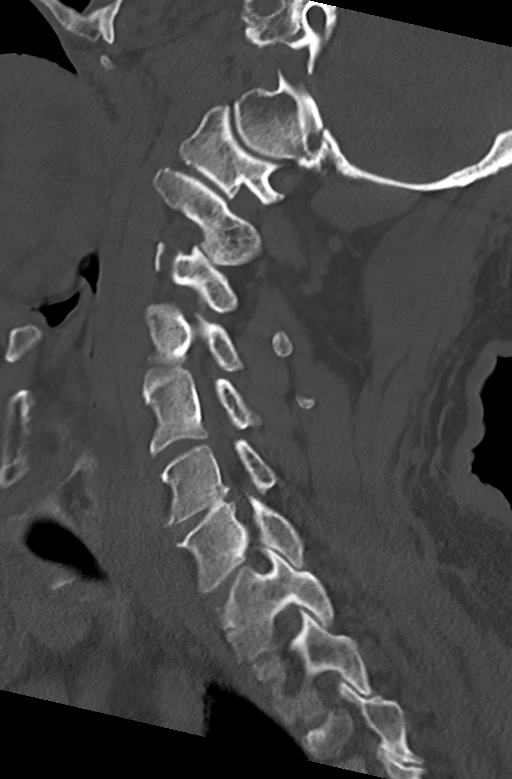
[im 36/71  soft-tissue]
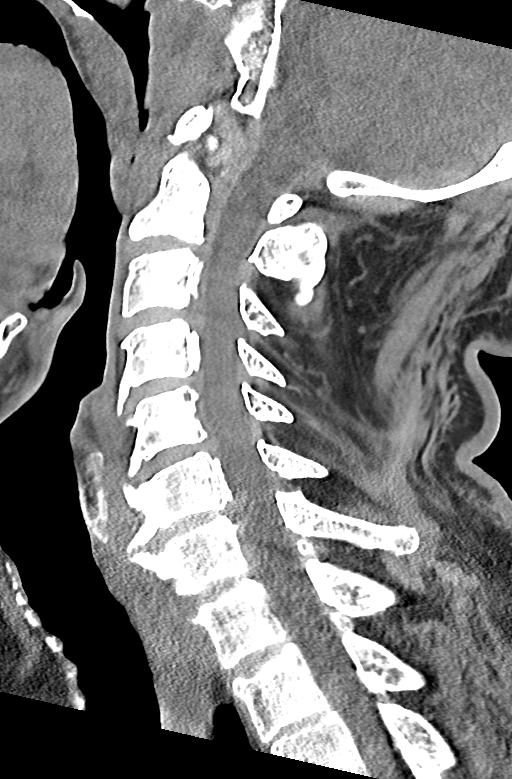
[im 36/71  bone]
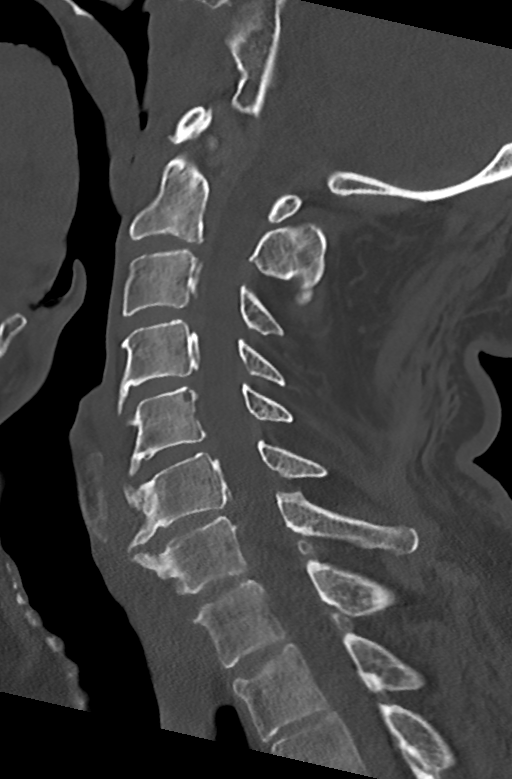
[im 41/71  bone]
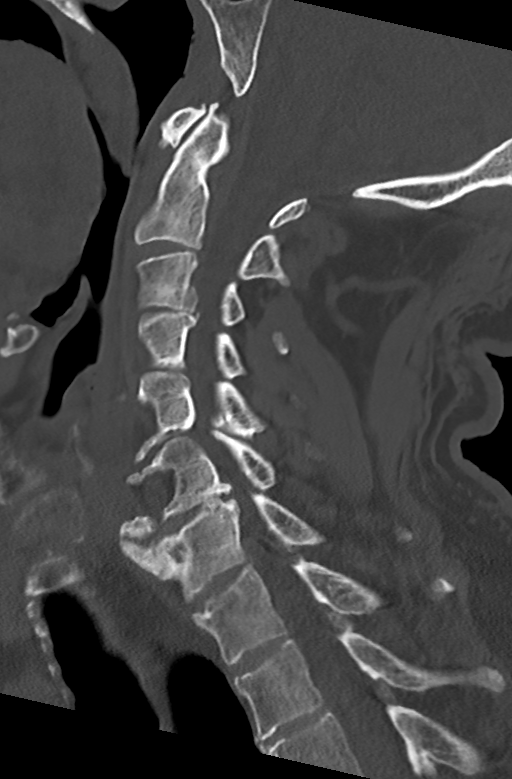
[im 47/71  bone]
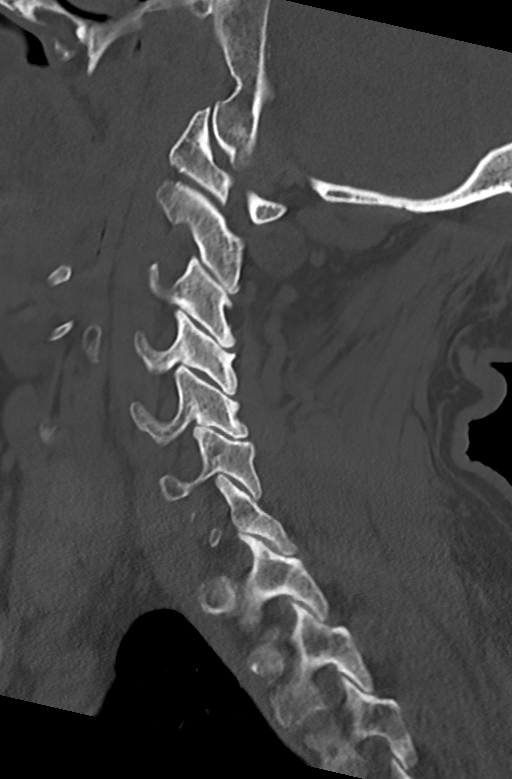

[Series 9: cor bone · coronal · 0.28mm/px · 3 of 73 slices shown]
[im 15/73  bone]
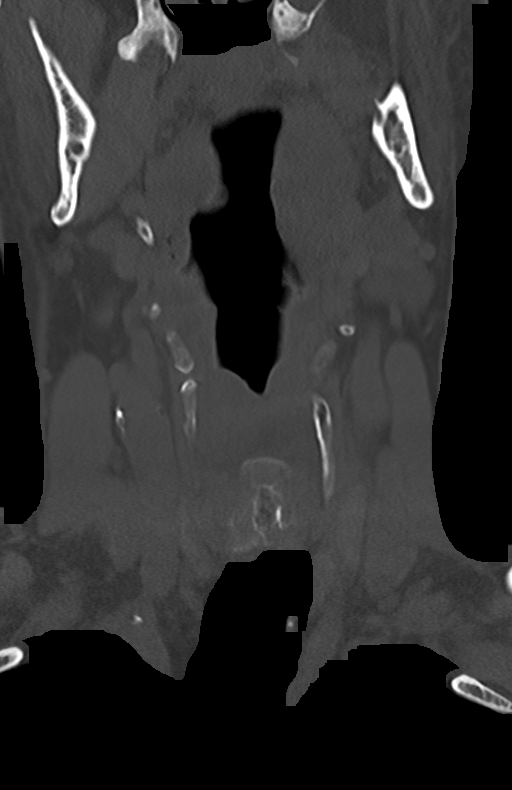
[im 29/73  bone]
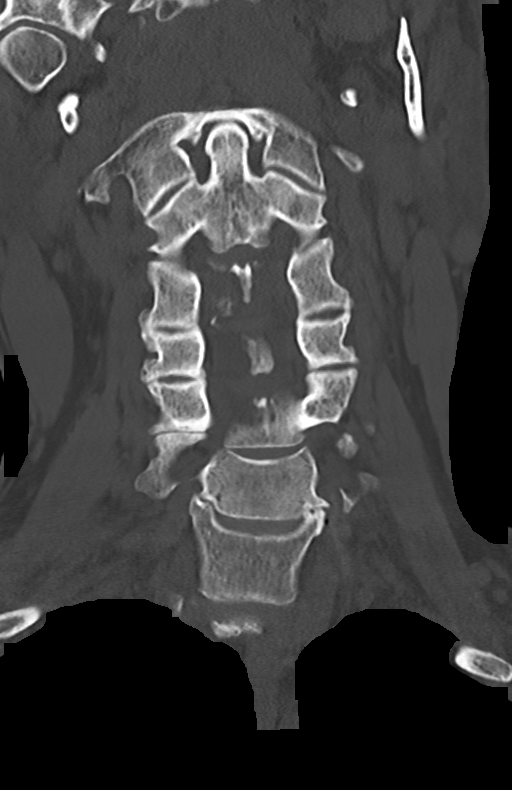
[im 44/73  bone]
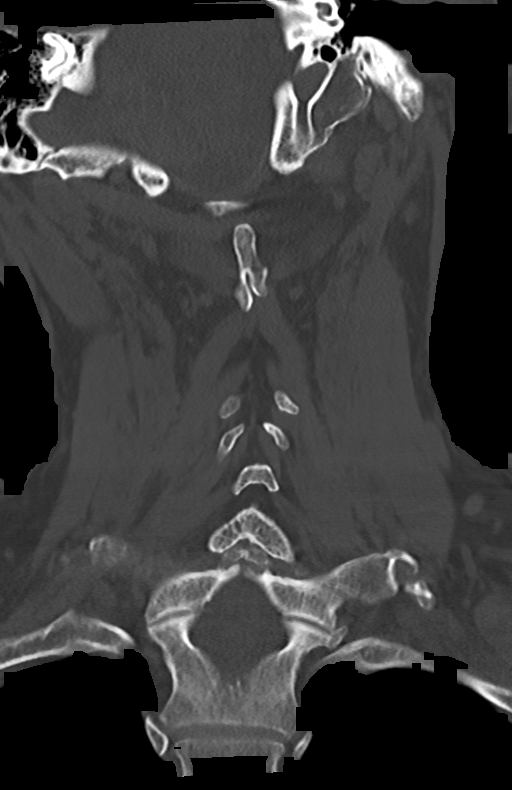

[Series 10: orthogonal axials · axial · 0.21mm/px · z∈[+933,+1057]mm · 4 of 107 slices shown, 5 images]
[im 18/107  soft-tissue]
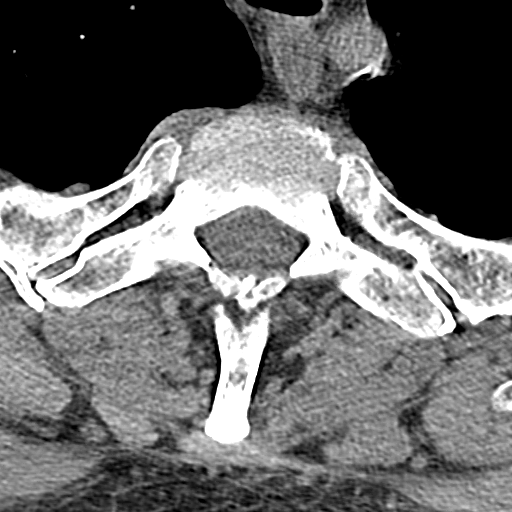
[im 18/107  bone]
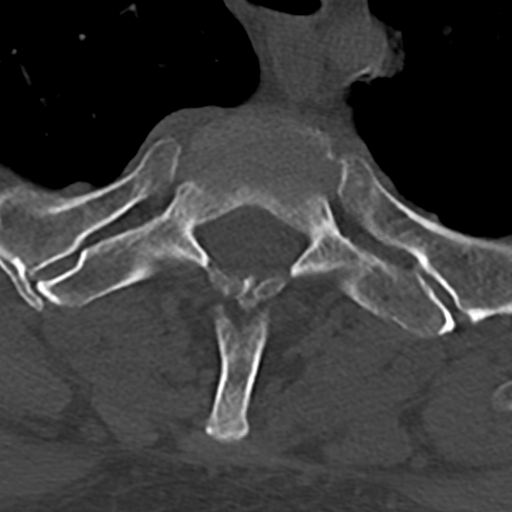
[im 36/107  bone]
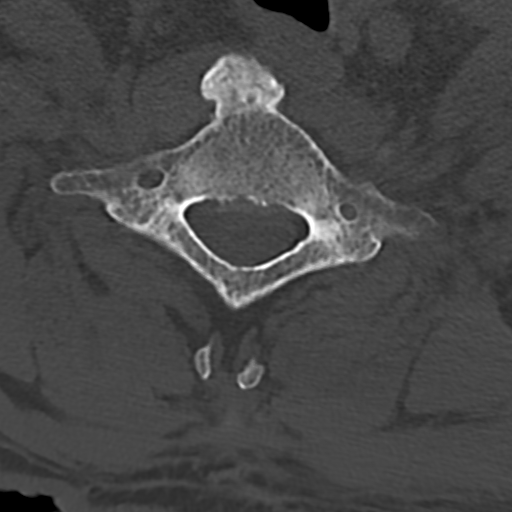
[im 71/107  bone]
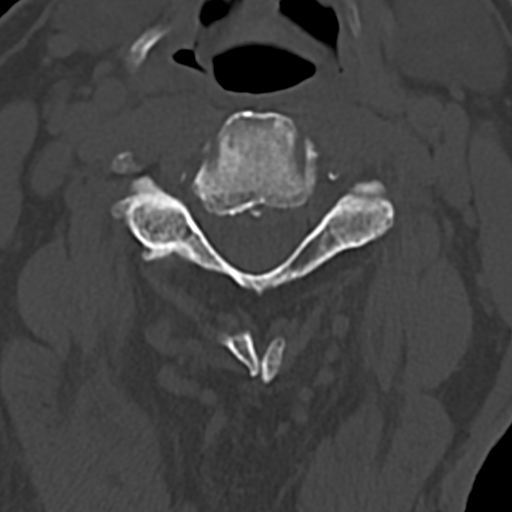
[im 89/107  bone]
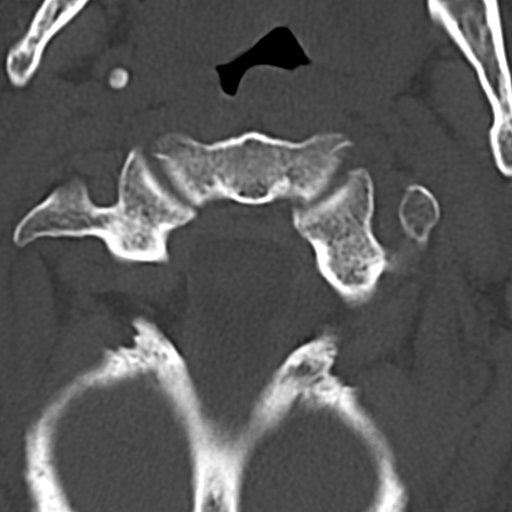

[12 of 33 positions shown; findings below may reference images not displayed]

FINDINGS: Alignment: Alignment is anatomic.

Skull base and vertebrae: No acute fracture. No primary bone lesion
or focal pathologic process.

Soft tissues and spinal canal: No prevertebral fluid or swelling. No
visible canal hematoma.

Disc levels: Multilevel cervical spondylosis, with prominent
anterior osteophyte formation from C4-5 through C6-7.
Circumferential disc osteophyte complex at C3-4 and C6-7 results in
right greater than left neural foraminal narrowing. Multilevel facet
hypertrophy greatest from C4 through C6.

Upper chest: Central airways patent.  Severe upper lobe emphysema.

Other: Reconstructed images demonstrate no additional findings.
IMPRESSION: 1. No acute cervical spine fracture.
2. Multilevel cervical spondylosis and facet hypertrophy as above.
3. Emphysema.

## 2022-03-02 IMAGING — XA IR PERCUTANEOUS ART THORMBECTOMY/INFUSION INTRACRANIAL INCLUDE D
8 of 17 series · 8 of 24 positions shown · IV contrast (IODINE)
Comparison: CT angiogram head and neck [DATE].

INDICATION: New onset right-sided weakness, aphasia, left-sided gaze preference.
Occluded left internal carotid artery extracranially and
intracranially on CT angiogram of the head and neck.

EXAM:
1. EMERGENT LARGE VESSEL OCCLUSION THROMBOLYSIS anterior
CIRCULATION)
TECHNIQUE: Two physician emergency consent was obtained as patient was unable
to provide consent due to his medical condition, and no next of ALFRE
or family members or power of attorney were available physically or
by phone.

[Series 2: cerebral · 2 acquisitions, 1 frame shown (1 of 8)]
[im 1/2]
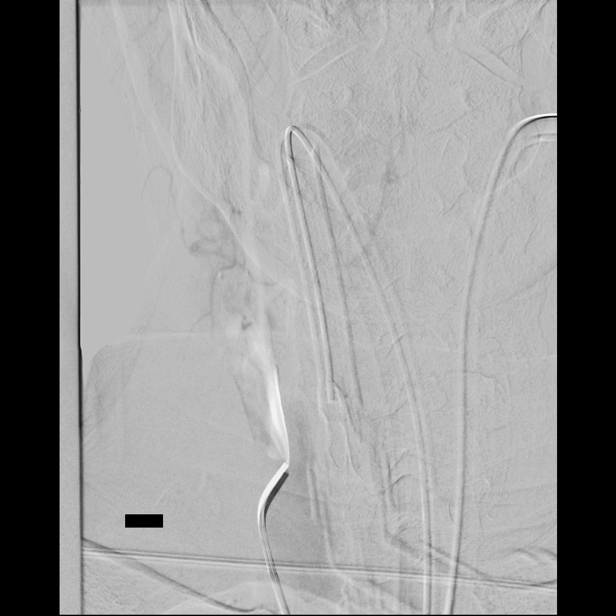

[Series 5: cerebral · 2 acquisitions, 1 frame shown (2 of 8)]
[im 1/2]
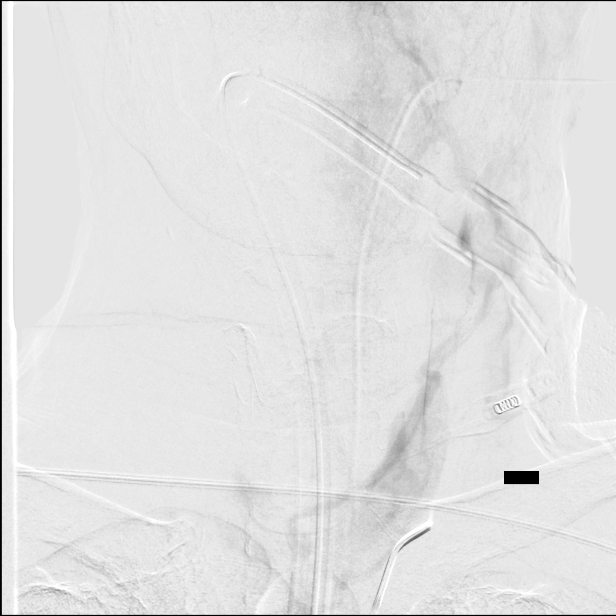

[Series 8: cerebral · 2 acquisitions, 1 frame shown (3 of 8)]
[im 1/2]
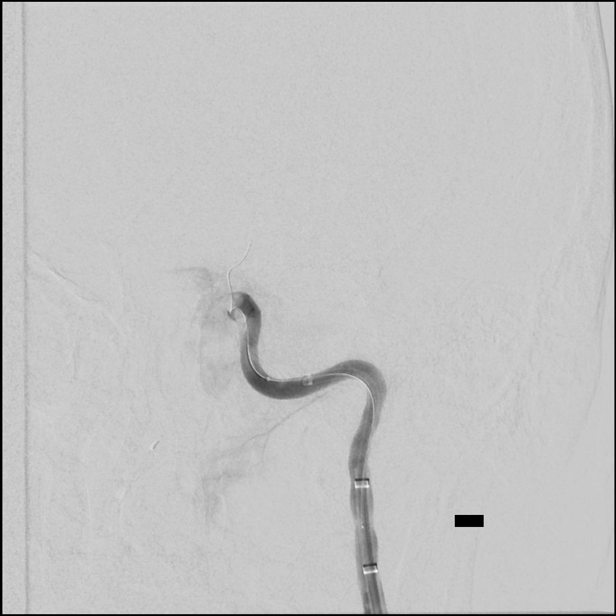

[Series 11: cerebral · 2 acquisitions, 1 frame shown (4 of 8)]
[im 1/2]
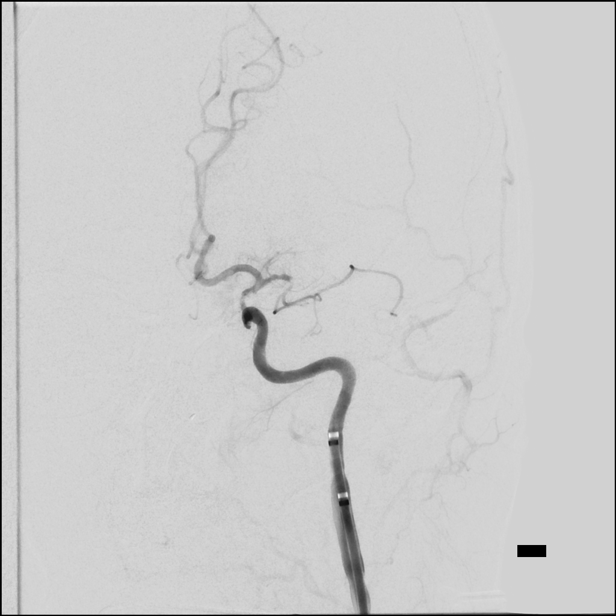

[Series 15: cerebral · 2 acquisitions, 1 frame shown (5 of 8)]
[im 1/2]
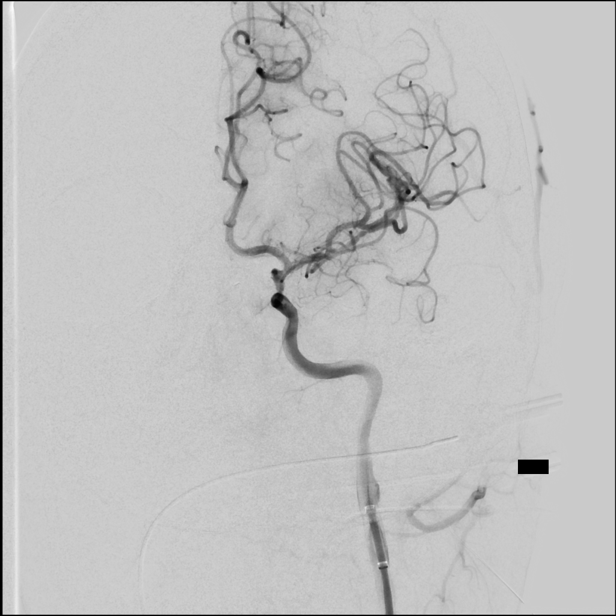

[Series 19: cerebral · 2 acquisitions, 1 frame shown (6 of 8)]
[im 1/2]
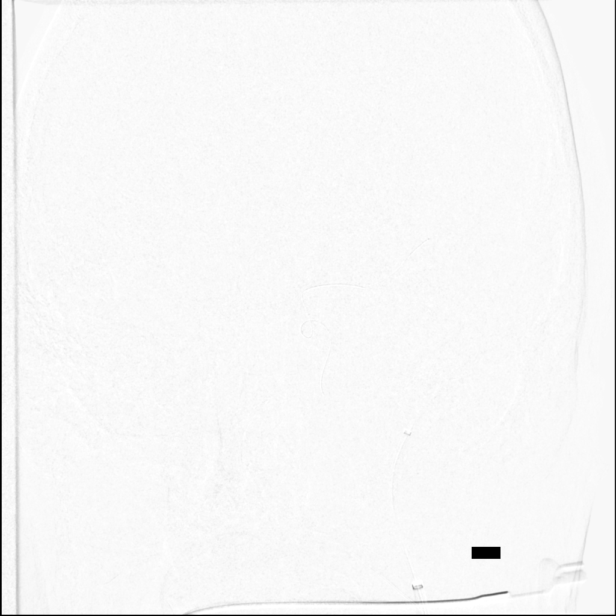

[Series 21: cerebral · 2 acquisitions, 1 frame shown (7 of 8)]
[im 1/2]
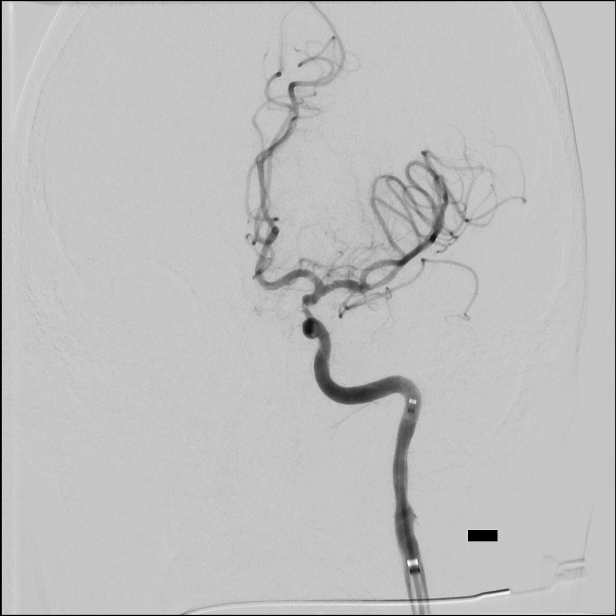

[Series 25: cerebral · 2 acquisitions, 1 frame shown (8 of 8)]
[im 1/2]
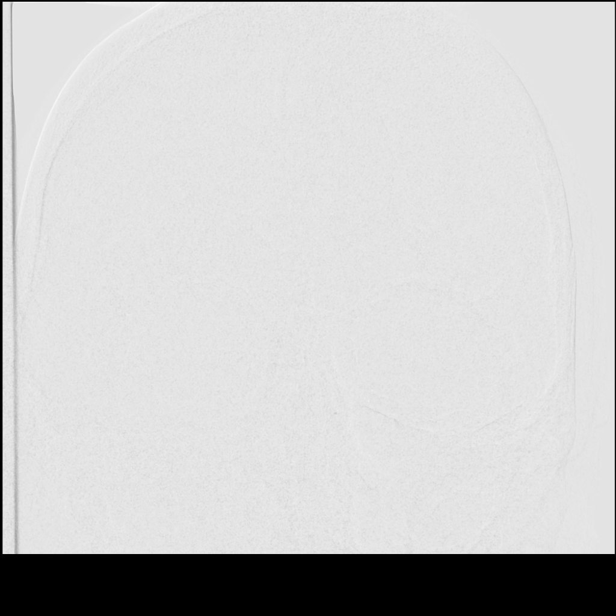

[8 of 24 positions shown; findings below may reference images not displayed]

MEDICATIONS:
Ancef 2 g IV antibiotic was administered within 1 hour of the
procedure.

ANESTHESIA/SEDATION:
General anesthesia.

CONTRAST:  Omnipaque 300 approximately 175 mL.

FLUOROSCOPY TIME:  Fluoroscopy Time: 63 minutes 42 seconds ([JY]
mGy).

COMPLICATIONS:
None immediate.
The patient was then put under general anesthesia by the [REDACTED] at [HOSPITAL].

The right groin was prepped and draped in the usual sterile fashion.
Thereafter using modified Seldinger technique, transfemoral access
into the right common femoral artery was obtained without
difficulty. Over a 0.035 inch guidewire an 8 French 25 cm Pinnacle
sheath was inserted. Through this, and also over a 0.035 inch
guidewire a 5 French JB 1 catheter was advanced to the aortic arch
region and selectively positioned in the right common carotid artery
and the left common carotid artery.
FINDINGS: The right common carotid arteriogram demonstrates the right external
carotid artery and its major branches to be widely patent.

Right internal carotid artery at the bulb has a smooth shallow
plaque along the posterior wall without evidence of significant
stenosis or of ulceration.

More distally, the vessel is seen to opacify to the cranial skull
base. The petrous, the cavernous and the supraclinoid segments
demonstrate wide patency.

The right posterior communicating artery is seen opacifying the
right posterior cerebral artery distribution.

The right middle cerebral artery and the right anterior cerebral
artery opacify into the capillary and venous phases. Brisk
cross-filling via the anterior communicating artery of the left
anterior cerebral A2 segment, and transiently the left M1 segment is
noted. No antegrade clearance of flow is noted in the left internal
carotid artery terminus. Poor filling of the inferior division is
seen.

The left common carotid arteriogram demonstrates the left external
carotid artery and its major branches to be widely patent.

Left internal carotid artery at the bulb demonstrates to and fro
contrast enhancement with a prominent filling defect which extends
to the cranial skull base.

PROCEDURE:
The diagnostic JB 1 catheter in the left common carotid artery was
then exchanged over a 0.035 inch 300 cm Rosen exchange guidewire for
an 087 95 cm balloon guide catheter which had been prepped with 50%
contrast and 50% heparinized saline infusion.

The guidewire was removed. Good aspiration obtained from the hub of
the balloon guide catheter. A gentle control arteriogram performed
demonstrated no evidence of intraluminal filling defects or of
dissection or spasm.

Over an 0.014 inch 200 cm Softip Aristotle micro guidewire with a
moderate J configuration and a combination of an 071 132 cm Zoom
aspiration catheter with an 021 150 cm microcatheter was advanced
without resistance to the horizontal petrous segment.

The micro guidewire was then gently advanced using a torque device
to the left middle cerebral artery inferior division M2 M3 region
followed by the microcatheter. The micro guidewire was removed. Good
aspiration obtained from the hub of the microcatheter. A gentle
control arteriogram performed through the microcatheter demonstrated
safe positioning of the tip of the microcatheter which was then
connected to continuous heparinized saline infusion. A 5 mm x 37 mm
Embotrap retrieval device was then advanced to the distal end of the
microcatheter. The O ring on the delivery microcatheter was
loosened. With slight gentle forward traction with the right hand on
the delivery micro guidewire, with the left hand the delivery
microcatheter was retrieved deploying the retrieval device.

The proximal portion of the retrieval device was in the left
internal carotid terminus.

The Zoom aspiration catheter was then advanced to the supraclinoid
segment with moderate resistance in the proximal cavernous segment.

The balloon guide was then advanced into the mid cervical left ICA.

With proximal flow arrest in the left internal carotid artery, and
constant aspiration applied with a 20 mL syringe at the hub of the
balloon guide catheter, and at the hub of the Zoom aspiration
catheter with a Penumbra aspiration device for approximately 2
minutes, the combination of the retrieval device, the microcatheter
and the Zoom aspiration catheter were retrieved and removed.
Following reversal of flow arrest, a gentle control arteriogram
performed through the balloon guide catheter in the left internal
carotid artery now demonstrated flow albeit slowly to the proximal
cavernous segment with no opacification distal to this.

Multiple chunks of clots were aspirated and also noted entangled in
the retrieval device. A second pass with the above combination was
performed with the balloon guide catheter was now in the distal left
internal carotid artery, and the Zoom aspiration catheter advanced
into the proximal M1 segment with the microcatheter in the inferior
division. After having confirmed safe positioning of tip of the
microcatheter, a 5 mm x 37 mm Embotrap retrieval device was again
deployed in the usual manner.

Constant aspiration was then applied with a 20 mL syringe at the hub
the balloon guide catheter, and also with an aspiration device at
the hub of the Zoom aspiration catheter for approximately 2-1/2
minutes. Control arteriogram performed following reversal of flow
arrest through the left internal carotid artery now demonstrated
complete revascularization of the left internal carotid artery and
of the left anterior cerebral artery and the proximal half of the
middle cerebral artery. Also uncovered was a severe focal stenosis
at the proximal cavernous left internal carotid artery associated
with a decompressed supraclinoid left ICA.

With the balloon in the distal left internal carotid artery, an 055
132 Zoom aspiration catheter in combination with an 021 150 cm
microcatheter combination was advanced over a 0.014 inch Aristotle
micro guidewire through the occluded left M1 segment at the level of
the anterior temporal branch and into the inferior division M2 M3
region followed by the microcatheter. The guidewire was removed.
Good aspiration obtained from the hub of the microcatheter which was
then connected to continuous heparinized saline infusion.

A 3 mm x 40 mm Solitaire X retrieval device was then deployed as
mentioned above. Proximal portion of the device was just proximal to
the site of the occlusion.

The 55 Zoom aspiration catheter was then advanced into the proximal
occluded portion of the left M1 segment. With proximal flow arrest
in the left internal carotid artery, and constant aspiration with a
20 mL syringe at the hub of the balloon guide catheter and the Zoom
aspiration catheter for approximately 2 minutes, the combination of
the retrieval device, the microcatheter, and the Zoom aspiration
catheter were retrieved and removed. Entangled clot was noted in the
retrieval device. Following reversal of flow arrest, a control
arteriogram performed through the balloon guide catheter
demonstrated revascularization of the left middle cerebral artery
achieving a TICI 2C revascularization.

A 2.5 mm x 15 mm [REDACTED] balloon angioplasty microcatheter was
prepped with 70% heparinized saline infusion and 25% heparinized
saline and advanced over a 0.014 inch micro guidewire through a 115
cm 5 French Catalyst guide catheter in the distal petrous segment.
Markers on the balloon catheter were positioned adequate distant
from the site of the severe stenosis.

Balloon angioplasty was then performed at the site of severe pre
occlusive stenosis of the proximal cavernous left ICA with a micro
inflation syringe device via micro tubing.

Balloon was expanded to 6 atmospheres initially and then to 10
atmospheres. Following deflation, and removal of the balloon, a
control arteriogram performed through the left internal carotid
artery demonstrated significantly improved caliber and flow through
the angioplastied segment with no change in the left MCA
distribution. The micro guidewire and balloon removed. Control
arteriogram was then performed at 10 and 20 minutes post angioplasty
which continued to demonstrate approximately 50% patency at the
angioplasty site with now with improved caliber of the supraclinoid
left ICA and improved flow hemodynamically into the left MCA
distribution.

At this time the patient was also given a total of 6 mg of intra
arterial Integrilin. Additionally, the patient was given 81 mg of
aspirin and 90 mg of Brilinta via an orogastric tube approximately
20 minutes prior to the angioplasty.

A final control arteriogram performed through the balloon guide
catheter in the left common carotid artery continued to demonstrate
excellent flow extra cranially and intracranially in the internal
carotid artery with brisk flow noted into the supraclinoid left ICA
and left middle cerebral artery distribution maintaining a TICI 2C
four-vessel cerebral arteriogram.

The balloon guide catheter was removed. The 8 French Pinnacle sheath
was removed with right groin hemostasis achieved with an 8 French
Angio-Seal closure device.

Distal pulses were Dopplerable in both feet unchanged.

Immediate CT of the brain demonstrated no evidence of intracranial
hemorrhage. Contrast blush was evident in the left caudate head and
in the frontal and parietal cortical areas.

Patient was extubated. Patient was able to move all 4 extremities
less so on the right side.

However, he continued to have comprehension difficulties. He was
then transferred to the neuro ICU for post revascularization care.
IMPRESSION: Status post endovascular complete revascularization of left internal
carotid artery intracranially and extra cranially with 2 passes of a
5 mm x 37 mm Embotrap retrieval device and contact aspiration with
complete revascularization.

Status post complete revascularization of the left middle cerebral
artery M1 segment with 1 pass with a 3 mm x 40 mm Solitaire
retrieval device, and contact aspiration achieving a TICI 2C
revascularization.

Status post balloon angioplasty of underlying severe pre occlusive
stenosis of the proximal left cavernous ICA due to intracranial
arteriosclerosis with residual stenosis of approximately 50%.

PLAN:
Follow-up in clinic in 2 weeks post discharge.

## 2022-03-02 IMAGING — DX DG CHEST 1V PORT
1 series · 1 of 1 positions shown · non-contrast
Comparison: None.

CLINICAL DATA: Seizure like activity.  Cough.

EXAM:
PORTABLE CHEST 1 VIEW

[chest ap]
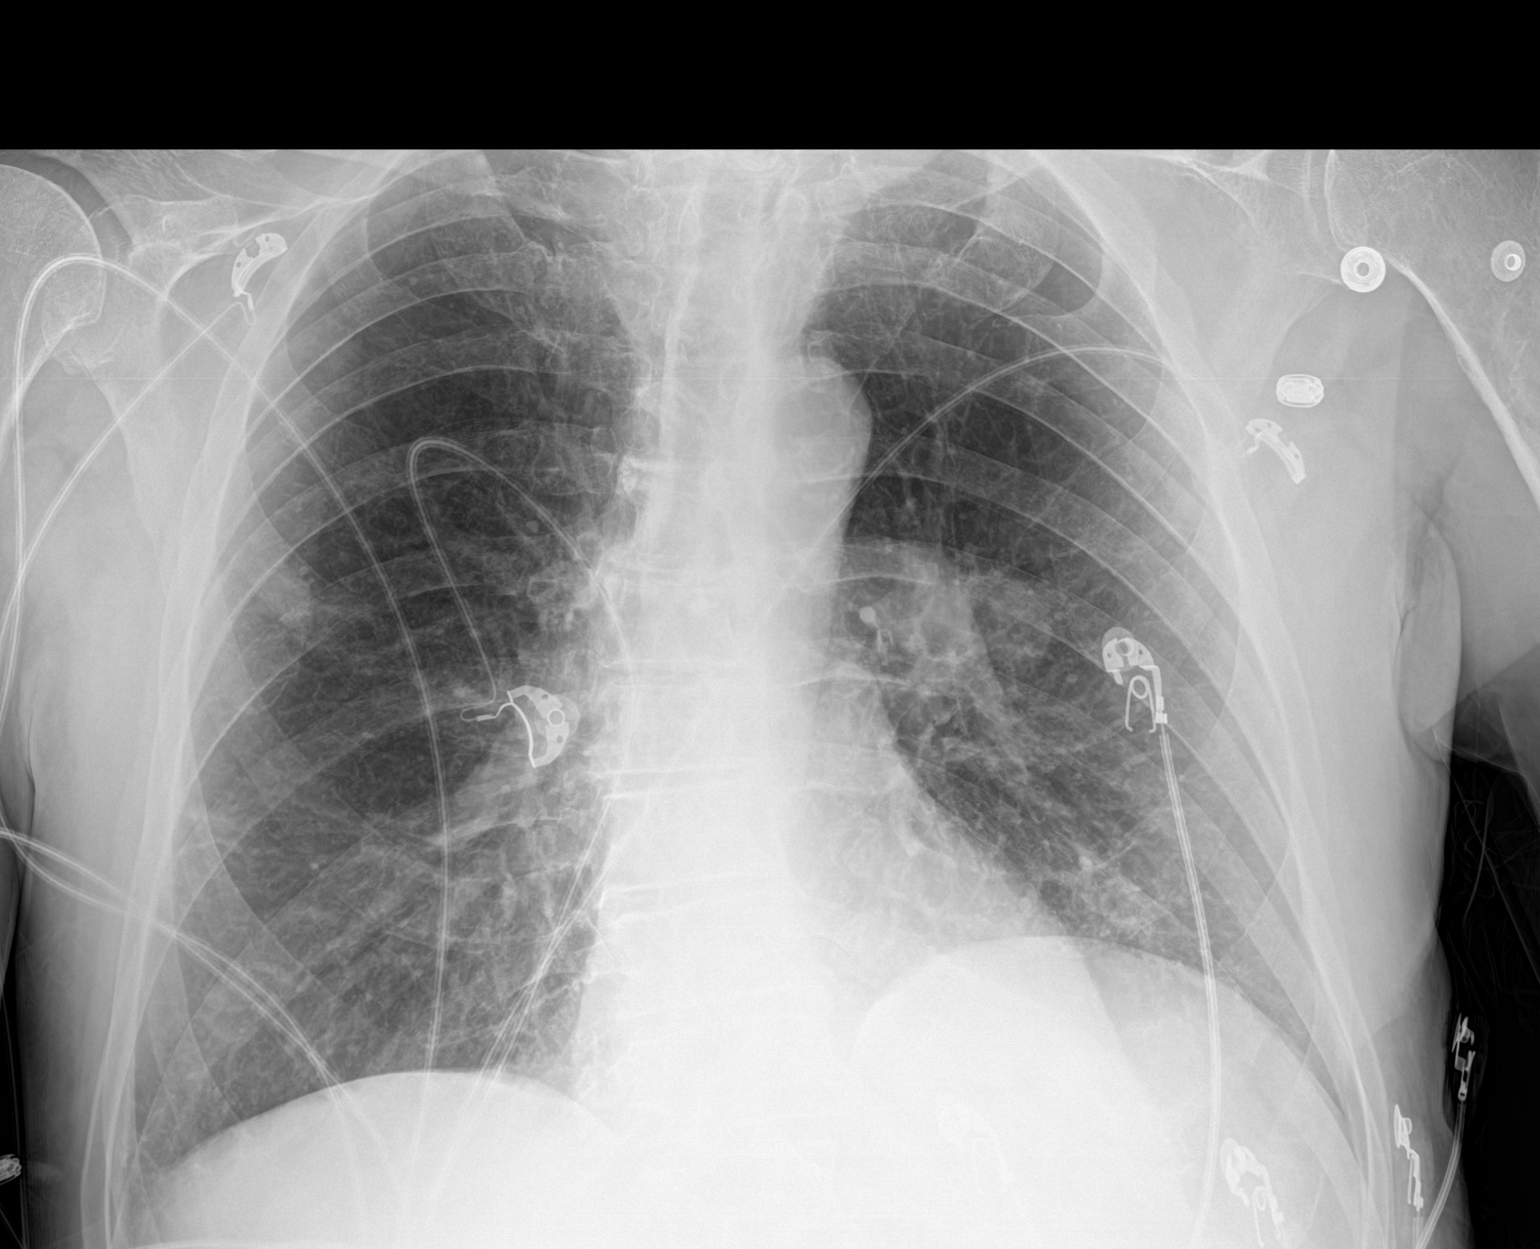

[1 of 1 positions shown; findings below may reference images not displayed]

FINDINGS: Heart size is normal. Artifact overlies the chest. Patient probably
has chronic lung disease with possible emphysema and chronic lung
markings. Few areas of patchy density in the mid and lower lungs
could possibly represent mild pneumonia. I would suggest a two-view
chest radiography exam be performed when able, particularly with
respect to a possible nodular shadow in the right mid lung which
could be better evaluated at that time. No effusion. No acute bone
finding.
IMPRESSION: Probable chronic lung disease. Emphysema and pulmonary scarring
suspected. Question few scattered patchy densities in the mid and
lower lungs that could possibly be inflammatory, but two-view chest
radiography is recommended when able to assess for the possibility
of pulmonary nodules.

## 2022-03-02 IMAGING — CT CT HEAD W/O CM
4 of 5 series · 15 of 47 positions shown, 17 images · non-contrast
Comparison: [DATE]

CLINICAL DATA: Seizure like activity, postictal, history of alcohol
abuse



[Series 4: head wo · axial · 0.48mm/px · z∈[+1102,+1222]mm · 4 of 41 slices shown (1 of 2)]
[im 9/41  brain]
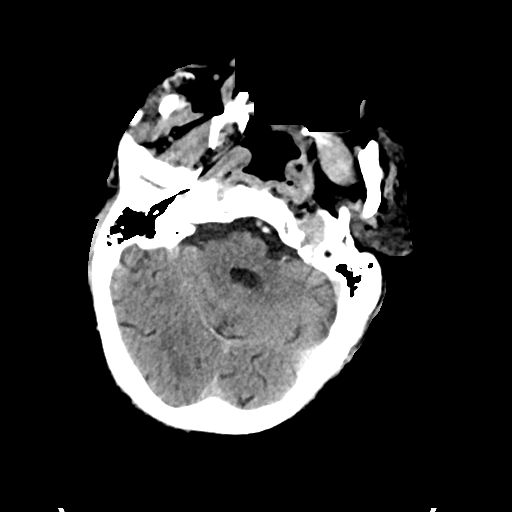
[im 17/41  brain]
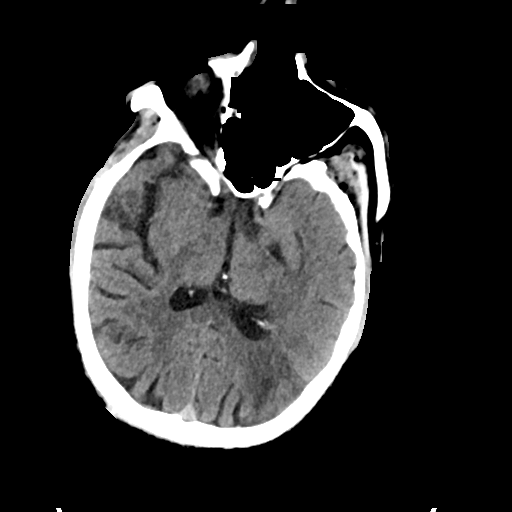
[im 25/41  brain]
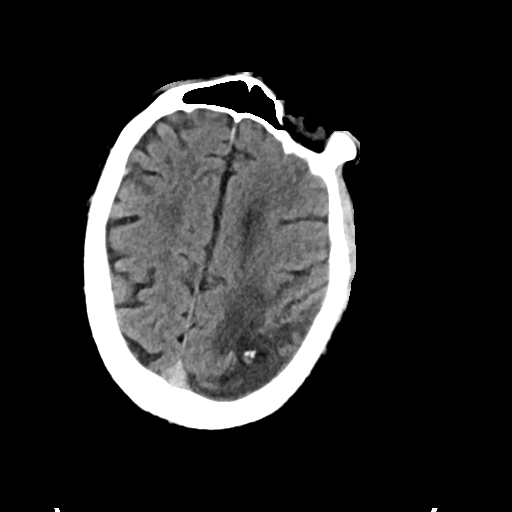
[im 33/41  brain]
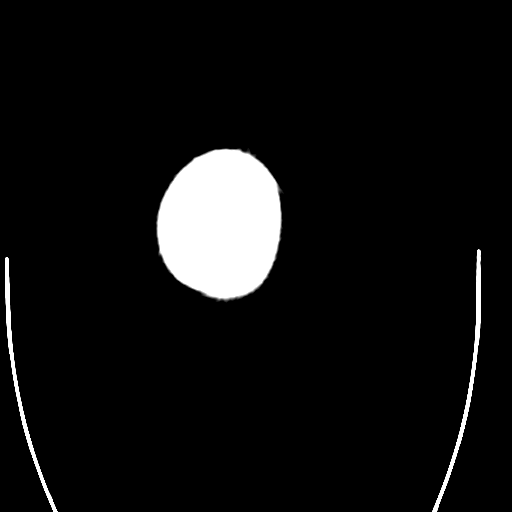

[Series 5: head wo · axial · 0.36mm/px · z∈[+1125,+1249]mm · 5 of 46 slices shown, 7 images (2 of 2)]
[im 8/46  brain]
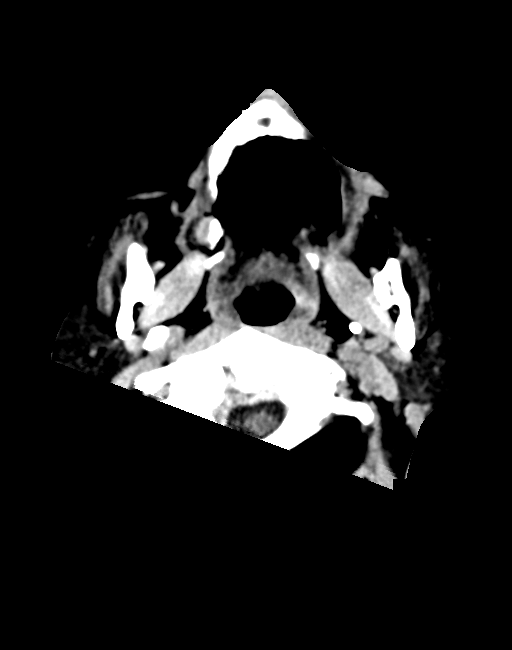
[im 8/46  bone]
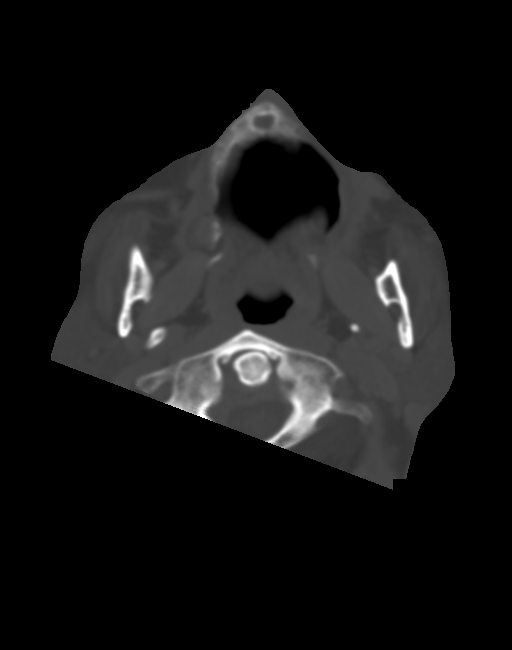
[im 16/46  brain]
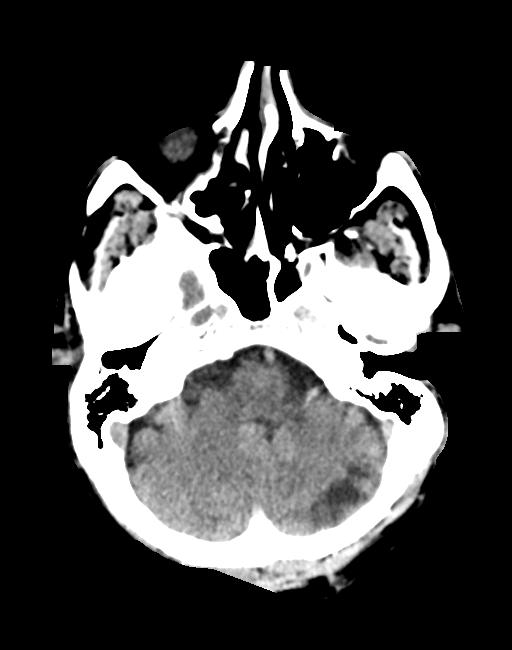
[im 23/46  brain]
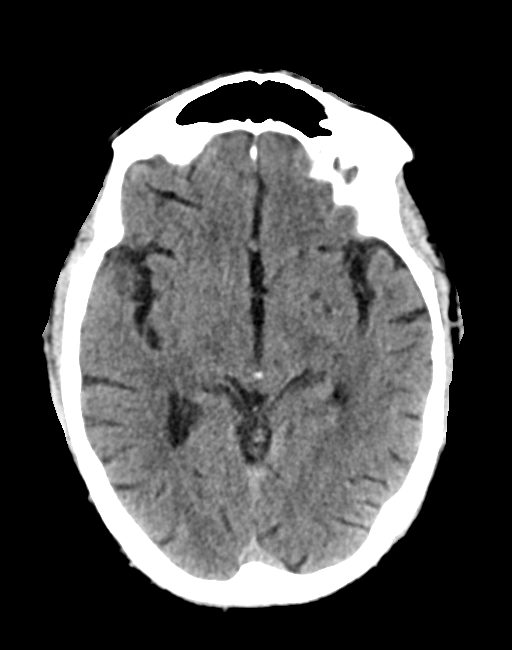
[im 31/46  brain]
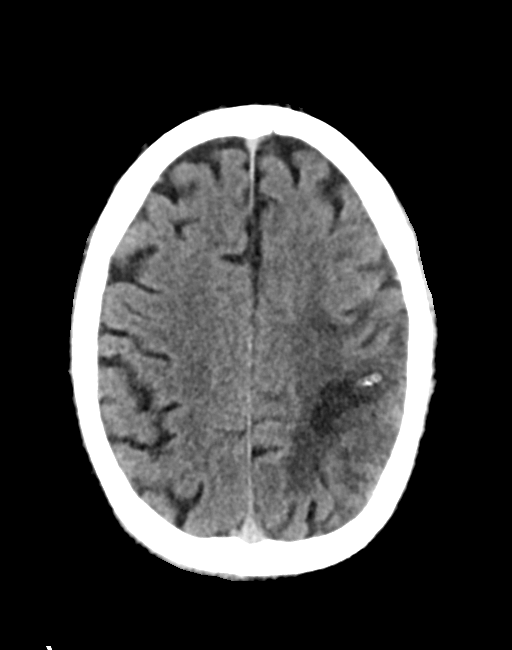
[im 38/46  brain]
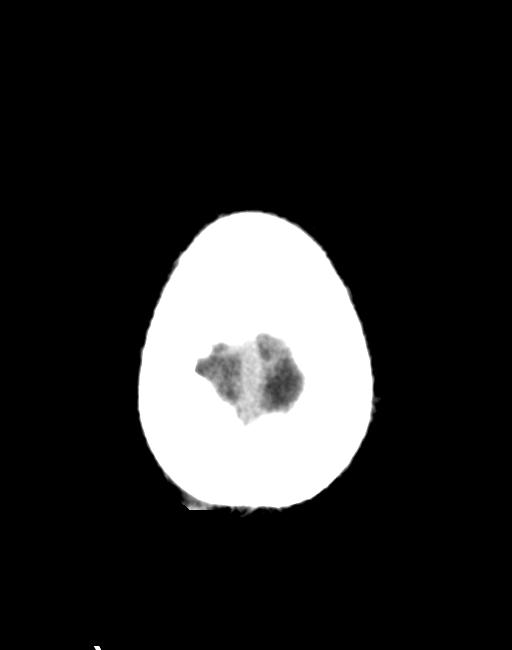
[im 38/46  bone]
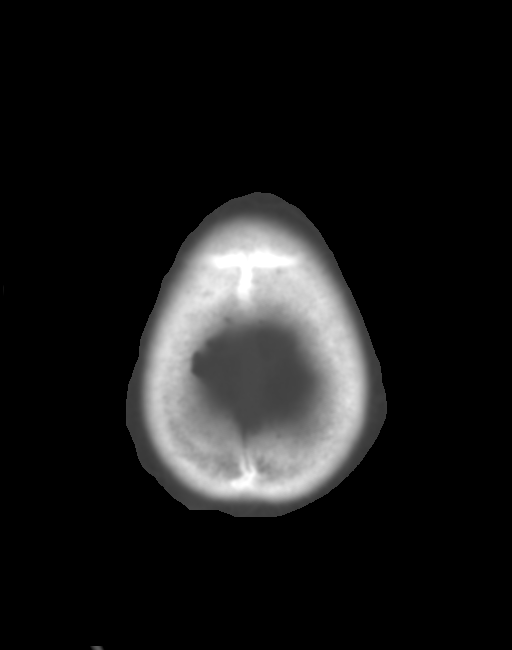

[Series 6: cor soft · coronal · 0.36mm/px · 3 of 68 slices shown]
[im 29/68  brain]
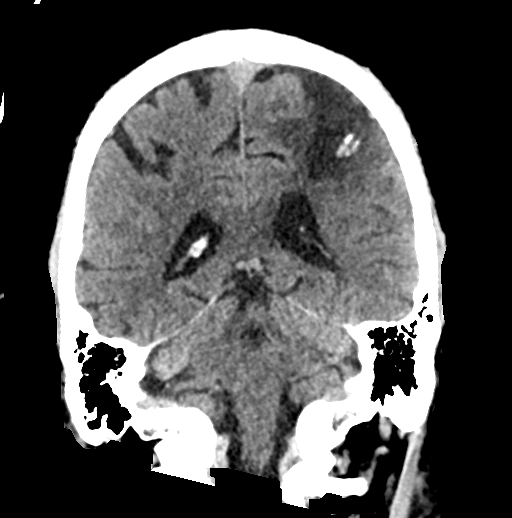
[im 34/68  brain]
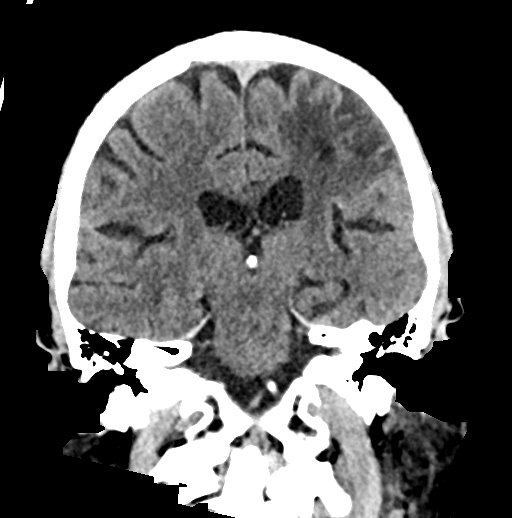
[im 40/68  brain]
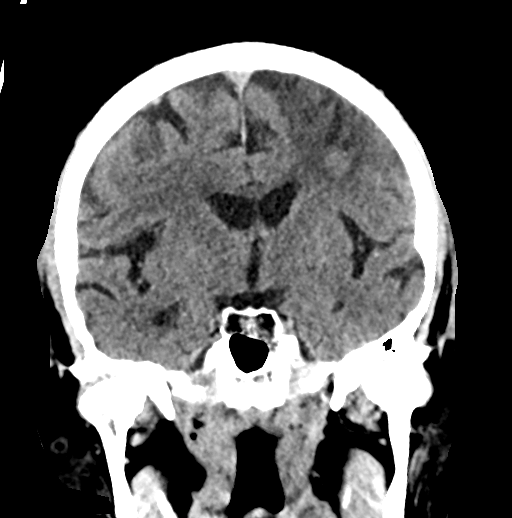

[Series 7: sag soft · sagittal · 0.37mm/px · 3 of 63 slices shown]
[im 26/63  brain]
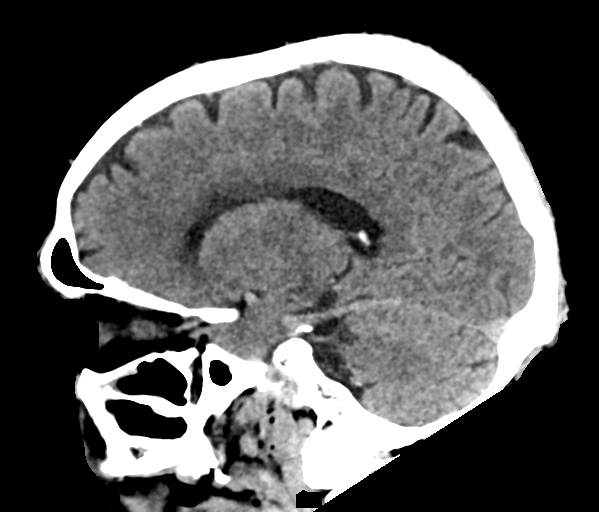
[im 32/63  brain]
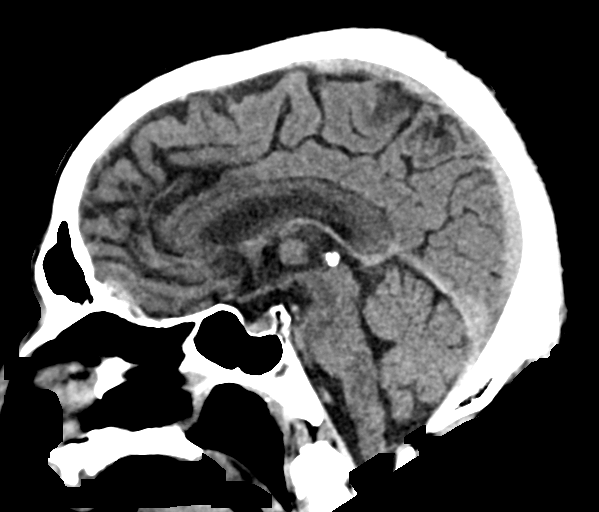
[im 37/63  brain]
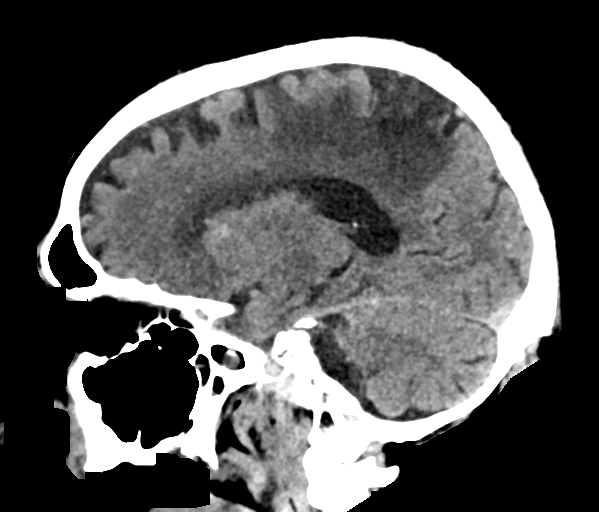

[15 of 47 positions shown; findings below may reference images not displayed]

FINDINGS: Brain: Chronic left MCA territory infarct again identified, with
interval dystrophic calcification in the infarct bed since prior
study. Chronic ischemic changes are again identified throughout the
periventricular and subcortical white matter, left basal ganglia,
and left cerebellar hemisphere. No evidence of acute infarct or
hemorrhage. Lateral ventricles and remaining midline structures are
unremarkable. No acute extra-axial fluid collections. No mass
effect.

Vascular: No hyperdense vessel or unexpected calcification.

Skull: Normal. Negative for fracture or focal lesion.

Sinuses/Orbits: Chronic opacification right maxillary sinus. Stable
mucosal thickening left maxillary sinus.

Other: None.
IMPRESSION: 1. Chronic ischemic changes as above. No acute intracranial process.

## 2022-03-02 SURGERY — IR WITH ANESTHESIA
Anesthesia: General

## 2022-03-02 MED ORDER — ESMOLOL HCL 100 MG/10ML IV SOLN
INTRAVENOUS | Status: DC | PRN
  Administered 2022-03-02: 30 mg via INTRAVENOUS

## 2022-03-02 MED ORDER — LEVETIRACETAM IN NACL 1000 MG/100ML IV SOLN
1000.0000 mg | Freq: Once | INTRAVENOUS | Status: AC
  Administered 2022-03-02: 1000 mg via INTRAVENOUS

## 2022-03-02 MED ORDER — IOHEXOL 300 MG/ML  SOLN
100.0000 mL | Freq: Once | INTRAMUSCULAR | Status: AC | PRN
  Administered 2022-03-02: 35 mL via INTRA_ARTERIAL

## 2022-03-02 MED ORDER — LEVETIRACETAM IN NACL 500 MG/100ML IV SOLN
500.0000 mg | Freq: Two times a day (BID) | INTRAVENOUS | Status: DC
  Administered 2022-03-03 – 2022-03-13 (×19): 500 mg via INTRAVENOUS
  Filled 2022-03-02 (×20): qty 100

## 2022-03-02 MED ORDER — ACETAMINOPHEN 650 MG RE SUPP: 650.0000 mg | RECTAL | Status: DC | PRN

## 2022-03-02 MED ORDER — IOHEXOL 300 MG/ML  SOLN
100.0000 mL | Freq: Once | INTRAMUSCULAR | Status: AC | PRN
  Administered 2022-03-02: 70 mL via INTRA_ARTERIAL

## 2022-03-02 MED ORDER — PROPOFOL 10 MG/ML IV BOLUS
INTRAVENOUS | Status: DC | PRN
  Administered 2022-03-02: 100 mg via INTRAVENOUS
  Administered 2022-03-02: 20 mg via INTRAVENOUS

## 2022-03-02 MED ORDER — FENTANYL CITRATE (PF) 100 MCG/2ML IJ SOLN
INTRAMUSCULAR | Status: DC | PRN
Start: 2022-03-02 — End: 2022-03-03
  Administered 2022-03-02: 100 ug via INTRAVENOUS

## 2022-03-02 MED ORDER — ENOXAPARIN SODIUM 40 MG/0.4ML IJ SOSY
40.0000 mg | PREFILLED_SYRINGE | INTRAMUSCULAR | Status: DC
  Administered 2022-03-03 – 2022-03-22 (×19): 40 mg via SUBCUTANEOUS
  Filled 2022-03-02 (×19): qty 0.4

## 2022-03-02 MED ORDER — SODIUM CHLORIDE 0.9 % IV BOLUS
1000.0000 mL | Freq: Once | INTRAVENOUS | Status: AC
  Administered 2022-03-02: 1000 mL via INTRAVENOUS

## 2022-03-02 MED ORDER — EPTIFIBATIDE 20 MG/10ML IV SOLN
INTRAVENOUS | Status: AC
  Filled 2022-03-02: qty 10

## 2022-03-02 MED ORDER — CLEVIDIPINE BUTYRATE 0.5 MG/ML IV EMUL
INTRAVENOUS | Status: DC | PRN
  Administered 2022-03-02: 1 mg/h via INTRAVENOUS

## 2022-03-02 MED ORDER — LACTATED RINGERS IV SOLN: INTRAVENOUS | Status: DC | PRN

## 2022-03-02 MED ORDER — PHENYLEPHRINE 40 MCG/ML (10ML) SYRINGE FOR IV PUSH (FOR BLOOD PRESSURE SUPPORT)
PREFILLED_SYRINGE | INTRAVENOUS | Status: DC | PRN
  Administered 2022-03-02: 80 ug via INTRAVENOUS

## 2022-03-02 MED ORDER — CEFAZOLIN SODIUM-DEXTROSE 2-3 GM-%(50ML) IV SOLR
INTRAVENOUS | Status: DC | PRN
  Administered 2022-03-02: 2 g via INTRAVENOUS

## 2022-03-02 MED ORDER — EPTIFIBATIDE 20 MG/10ML IV SOLN
INTRAVENOUS | Status: AC | PRN
  Administered 2022-03-02 (×3): 2 mg via INTRAVENOUS

## 2022-03-02 MED ORDER — NITROGLYCERIN 1 MG/10 ML FOR IR/CATH LAB
INTRA_ARTERIAL | Status: AC | PRN
  Administered 2022-03-02 (×2): 25 ug via INTRA_ARTERIAL

## 2022-03-02 MED ORDER — ROCURONIUM BROMIDE 10 MG/ML (PF) SYRINGE
PREFILLED_SYRINGE | INTRAVENOUS | Status: DC | PRN
  Administered 2022-03-02: 50 mg via INTRAVENOUS
  Administered 2022-03-02: 20 mg via INTRAVENOUS

## 2022-03-02 MED ORDER — IOHEXOL 350 MG/ML SOLN
115.0000 mL | Freq: Once | INTRAVENOUS | Status: AC | PRN
  Administered 2022-03-02: 115 mL via INTRAVENOUS

## 2022-03-02 MED ORDER — TICAGRELOR 90 MG PO TABS
ORAL_TABLET | ORAL | Status: AC
  Filled 2022-03-02: qty 1

## 2022-03-02 MED ORDER — ACETAMINOPHEN 160 MG/5ML PO SOLN: 650.0000 mg | ORAL | Status: DC | PRN

## 2022-03-02 MED ORDER — ACETAMINOPHEN 325 MG PO TABS: 650.0000 mg | ORAL_TABLET | ORAL | Status: DC | PRN

## 2022-03-02 MED ORDER — ASPIRIN 81 MG PO CHEW
CHEWABLE_TABLET | ORAL | Status: AC
  Filled 2022-03-02: qty 1

## 2022-03-02 MED ORDER — NITROGLYCERIN 1 MG/10 ML FOR IR/CATH LAB
INTRA_ARTERIAL | Status: AC
  Filled 2022-03-02: qty 10

## 2022-03-02 MED ORDER — SODIUM CHLORIDE 0.9 % IV SOLN
2000.0000 mg | Freq: Once | INTRAVENOUS | Status: DC
  Filled 2022-03-02: qty 20

## 2022-03-02 MED ORDER — SUGAMMADEX SODIUM 200 MG/2ML IV SOLN
INTRAVENOUS | Status: DC | PRN
Start: 2022-03-02 — End: 2022-03-03
  Administered 2022-03-02: 200 mg via INTRAVENOUS

## 2022-03-02 MED ORDER — ATORVASTATIN CALCIUM 80 MG PO TABS: 80.0000 mg | ORAL_TABLET | Freq: Every day | ORAL | Status: DC

## 2022-03-02 MED ORDER — EPHEDRINE SULFATE-NACL 50-0.9 MG/10ML-% IV SOSY
PREFILLED_SYRINGE | INTRAVENOUS | Status: DC | PRN
  Administered 2022-03-02 (×3): 5 mg via INTRAVENOUS

## 2022-03-02 MED ORDER — ASPIRIN 325 MG PO TABS
ORAL_TABLET | ORAL | Status: DC | PRN
  Administered 2022-03-02: 81 mg

## 2022-03-02 MED ORDER — FENTANYL CITRATE (PF) 100 MCG/2ML IJ SOLN
INTRAMUSCULAR | Status: AC
  Filled 2022-03-02: qty 2

## 2022-03-02 MED ORDER — STROKE: EARLY STAGES OF RECOVERY BOOK
Freq: Once | Status: AC
  Filled 2022-03-02: qty 1

## 2022-03-02 MED ORDER — CLEVIDIPINE BUTYRATE 0.5 MG/ML IV EMUL
INTRAVENOUS | Status: AC
  Filled 2022-03-02: qty 50

## 2022-03-02 MED ORDER — SODIUM CHLORIDE 0.9 % IV SOLN: INTRAVENOUS | Status: DC

## 2022-03-02 MED ORDER — LEVETIRACETAM 500 MG PO TABS
500.0000 mg | ORAL_TABLET | Freq: Two times a day (BID) | ORAL | Status: DC
  Administered 2022-03-12 – 2022-03-13 (×3): 500 mg via ORAL
  Filled 2022-03-02 (×6): qty 1

## 2022-03-02 MED ORDER — TICAGRELOR 60 MG PO TABS
ORAL_TABLET | ORAL | Status: DC | PRN
  Administered 2022-03-02: 90 mg via ORAL

## 2022-03-02 MED ORDER — SODIUM CHLORIDE 0.9 % IV SOLN: 2000.0000 mg | Freq: Once | INTRAVENOUS | Status: DC

## 2022-03-02 MED ORDER — LIDOCAINE 2% (20 MG/ML) 5 ML SYRINGE
INTRAMUSCULAR | Status: DC | PRN
  Administered 2022-03-02: 60 mg via INTRAVENOUS

## 2022-03-02 MED ORDER — PHENYLEPHRINE HCL-NACL 20-0.9 MG/250ML-% IV SOLN
INTRAVENOUS | Status: DC | PRN
  Administered 2022-03-02: 50 ug/min via INTRAVENOUS

## 2022-03-02 MED ORDER — CEFAZOLIN SODIUM-DEXTROSE 2-4 GM/100ML-% IV SOLN
INTRAVENOUS | Status: AC
  Filled 2022-03-02: qty 100

## 2022-03-02 MED ORDER — SENNOSIDES-DOCUSATE SODIUM 8.6-50 MG PO TABS: 1.0000 | ORAL_TABLET | Freq: Every evening | ORAL | Status: DC | PRN

## 2022-03-02 MED ORDER — SUCCINYLCHOLINE CHLORIDE 200 MG/10ML IV SOSY
PREFILLED_SYRINGE | INTRAVENOUS | Status: DC | PRN
  Administered 2022-03-02: 100 mg via INTRAVENOUS

## 2022-03-02 NOTE — Sedation Documentation (Signed)
8 Fr angioseal deployed right groin ? ?

## 2022-03-02 NOTE — Procedures (Signed)
INR. ?61 year old right-handed male. ? ?Modified Rankin score 1. ? ?Last seen well unknown ? ?Patient presented with new onset seizure with worsening of right-sided weakness left gaze preference and aphasia. ? ?CT of the brain no acute hemorrhage.  Sequela of previous ischemic stroke involving the parietal occipital region.. ? ?Aspects score unreliable.  CT perfusion scan core 0 mL mismatch of 121 mL patient deemed appropriate for endovascular revascularization given the clinical history and neurological examination.  Prior and emergency two-physician consent obtained as no family member or next of kin could be reached physically or by phone. ? ? S. Jekhi Bolin MD ? ? ?

## 2022-03-02 NOTE — Anesthesia Procedure Notes (Signed)
Arterial Line Insertion ?Start/End3/15/2023 9:35 PM, 03/02/2022 9:45 PM ?Performed by: Babs Bertin, CRNA, CRNA ? Patient location: OR. ?Preanesthetic checklist: patient identified and IV checked ?Left, radial was placed ?Maximum sterile barriers used  ? ?Attempts: 1 ?Procedure performed without using ultrasound guided technique. ?Ultrasound Notes:anatomy identified ?Following insertion, dressing applied. ? ? ?

## 2022-03-02 NOTE — ED Notes (Signed)
Patient transported to CT 

## 2022-03-02 NOTE — ED Triage Notes (Signed)
Pt BIB [facility] from home, roommate witnessed seizure-like activity. Pt initially post-ictal on EMS arrival. Per EMS, bystanders report pt is a heavy drinker and has not been drinking "lately". Pt has a productive cough as well. Given 500ccNS by EMS. ?

## 2022-03-02 NOTE — Code Documentation (Signed)
Stroke Response Nurse Documentation ?Code Documentation ? ?VASILY FEDEWA is a 61 y.o. male arriving to [facility]  via [facility] on 3/15 with past medical hx of HTN and previous stroke. On aspirin 81 mg daily. Code stroke was activated by ED.  ? ?Patient from home where he was unknown LKW and now complaining of slurred speech and right sided weakness.  ? ?Stroke team at the bedside on patient arrival. Labs drawn and patient cleared for CT by Dr. Elliot Gurney. Patient to CT with team. NIHSS 16, see documentation for details and code stroke times. Patient with disoriented, left gaze preference , right hemianopia, right facial droop, right arm weakness, right leg weakness, left limb ataxia, right decreased sensation, Global aphasia , dysarthria , and Sensory  neglect on exam. The following imaging was completed:  CT Head, CTA, and CTP. Patient is not a candidate for IV Thrombolytic due to unknown LKW. Patient is not a candidate for IR due to LVO left ICA occlusion.  ? ?Care Plan: Thrombectomy.  ? ?Bedside handoff with IR RN Marylyn Ishihara .   ? ?Madelynn Done  ?Rapid Response RN ? ? ?

## 2022-03-02 NOTE — CV Procedure (Addendum)
INR. ?Left common carotid arteriogram.  Right CFA approach. ? ?Findings.  Occlusion of left internal carotid artery extracranially and intracranially with questionable filling defect in the left middle cerebral artery in the distal M1 segment. ? ?Status post endovascular complete revascularization of occluded left internal carotid artery extracranially and intracranially, with 2 passes of 5 mm x 37 mm ember trapped retriever with contact aspiration. ? ?Status post revascularization of occluded left middle cerebral M1 segment with 3 mm x 4 mm softer extradural device, in contact aspiration achieving aTICI 2c revascularization. ? ?Balloon angioplasty performed of follow-up preocclusive atherosclerotic related stenosis of the proximal cavernous segment with a residual stenosis of approximately 40 to 50%. ? ?Post procedure CT of the brain demonstrates no evidence of hemorrhage.  Contra staining noted in the left caudate head, and the frontal cortex and the left parietal cortex. ?8 [place] Angio-Seal used for hemostasis at the right groin puncture site.  Distal pulses all palpable in both feet. ? ?Patient extubated.  Moving all fours left greater than right.  Pupils approximately 4 mm round regular equal and reactive..  Not following simple commands. ? ? ?Medications. ? ?Aspirin 81 mg p.o., and Brilinta 90 mg p.o. via orogastric tube.  6 mg of Integrilin intra-arterially ? ? ?S.  Karyme Mcconathy MD ?

## 2022-03-02 NOTE — Anesthesia Procedure Notes (Signed)
Procedure Name: Intubation ?Date/Time: 03/02/2022 9:26 PM ?Performed by: Babs Bertin, CRNA ?Pre-anesthesia Checklist: Patient identified, Emergency Drugs available, Suction available and Patient being monitored ?Patient Re-evaluated:Patient Re-evaluated prior to induction ?Oxygen Delivery Method: Circle System Utilized ?Preoxygenation: Pre-oxygenation with 100% oxygen ?Induction Type: IV induction and Rapid sequence ?Laryngoscope Size: Mac and 3 ?Grade View: Grade I ?Tube type: Oral ?Tube size: 7.5 mm ?Number of attempts: 1 ?Airway Equipment and Method: Stylet and Oral airway ?Placement Confirmation: ETT inserted through vocal cords under direct vision, positive ETCO2 and breath sounds checked- equal and bilateral ?Secured at: 23 cm ?Tube secured with: Tape ?Dental Injury: Teeth and Oropharynx as per pre-operative assessment  ? ? ? ? ?

## 2022-03-02 NOTE — Anesthesia Preprocedure Evaluation (Addendum)
Anesthesia Evaluation  ?Patient identified by MRN, date of birth, ID band ?Patient awake ? ? ? ?Reviewed: ?Allergy & Precautions, H&P , NPO status , Patient's Chart, lab work & pertinent test results ? ?Airway ?Mallampati: II ? ?TM Distance: >3 FB ?Neck ROM: Full ? ? ? Dental ?no notable dental hx. ?(+) Edentulous Upper, Edentulous Lower, Dental Advisory Given ?  ?Pulmonary ?Current Smoker,  ?  ?Pulmonary exam normal ?breath sounds clear to auscultation ? ? ? ? ? ? Cardiovascular ?hypertension, Pt. on medications and Pt. on home beta blockers ? ?Rhythm:Regular Rate:Normal ? ? ?  ?Neuro/Psych ?CVA, Residual Symptoms negative psych ROS  ? GI/Hepatic ?negative GI ROS, Neg liver ROS,   ?Endo/Other  ?negative endocrine ROS ? Renal/GU ?negative Renal ROS  ?negative genitourinary ?  ?Musculoskeletal ? ? Abdominal ?  ?Peds ? Hematology ?negative hematology ROS ?(+)   ?Anesthesia Other Findings ? ? Reproductive/Obstetrics ?negative OB ROS ? ?  ? ? ? ? ? ? ? ? ? ? ? ? ? ?  ?  ? ? ? ? ? ? ? ?Anesthesia Physical ?Anesthesia Plan ? ?ASA: 3 and emergent ? ?Anesthesia Plan: General  ? ?Post-op Pain Management:   ? ?Induction: Intravenous, Rapid sequence and Cricoid pressure planned ? ?PONV Risk Score and Plan: 2 and Ondansetron and Dexamethasone ? ?Airway Management Planned: Oral ETT ? ?Additional Equipment: Arterial line ? ?Intra-op Plan:  ? ?Post-operative Plan: Extubation in OR and Possible Post-op intubation/ventilation ? ?Informed Consent: I have reviewed the patients History and Physical, chart, labs and discussed the procedure including the risks, benefits and alternatives for the proposed anesthesia with the patient or authorized representative who has indicated his/her understanding and acceptance.  ? ? ? ?Dental advisory given ? ?Plan Discussed with: CRNA ? ?Anesthesia Plan Comments:   ? ? ? ? ? ? ?Anesthesia Quick Evaluation ? ?

## 2022-03-02 NOTE — H&P (Addendum)
NEUROLOGY CONSULTATION NOTE  ? ?Date of service: March 02, 2022 ?Patient Name: Todd Estrada ?MRN:  [id number] ?DOB:  Mar 04, 1961 ?_ _ _   _ __   _ __ _ _  __ __   _ __   __ _ ? ?History of Present Illness  ?Todd Estrada is a 61 y.o. 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  ? ?Patient is unable to provide any meaningful history secondary to significant expressive aphasia. Per notes, he was witnessed by his roommate to have had a seizure and was noted to be post ictal in the ED along with weak in RUE and RLE and with aphasia and slurred speech. ? ?His symptoms were initially felt to be post ictal. However, he did not have any significant improvement after waiting a couple hours and thus case was discussed with neurology. A code stroke was activated in the ED to expedite workup. CTA notable for occlusion of the left ICA, from just distal to the bifurcation through the supraclinoid segment, where it reconstitutes, likely retrograde. This is new from the prior CTA in august 2022. CTP with no core and a mismatch of 152ml. ? ? ?LKW: unknown ?mRS: unclear, presumed to be 1. ?tNKASE: not offered given unclear LKW and prior hx of L MCA stroke with petechial hemorrhage. ?Thrombectomy: thrombectomy was offered. We were unable to reach out to the listed contact in the chart [name]. We were unable to identify any other next of kin. Unfortunately given sever aphasia, [name] is unable to understand the details, risks and benefits of thrombectomy and potential stenting and therefore unable to consent. Two physcian consent was used and I discussed the case with Dr. Estanislado Pandy with the Neuro IR team. We discussed the risks and benefits, details of the procedure and alternatives to procedure including permissive hypertension and fluids given no core. However, felt that given the severity and the  disabling nature of his symptoms, it is best to take him for thrombectomy and stenting of the L ICA despite risks. I tried my best to explain the procedure to the patient and about my discussion with Dr. Estanislado Pandy but unsure how much patient was able to understand. ? ?NIHSS components Score: Comment  ?1a Level of Conscious 0[x]  1[]  2[]  3[]      ?1b LOC Questions 0[]  1[]  2[x]       ?1c LOC Commands 0[x]  1[]  2[]       ?2 Best Gaze 0[]  1[x]  2[]       ?3 Visual 0[]  1[]  2[x]  3[]      ?4 Facial Palsy 0[]  1[]  2[x]  3[]      ?5a Motor Arm - left 0[x]  1[]  2[]  3[]  4[]  UN[]    ?5b Motor Arm - Right 0[]  1[x]  2[]  3[]  4[]  UN[]    ?6a Motor Leg - Left 0[x]  1[]  2[]  3[]  4[]  UN[]    ?6b Motor Leg - Right 0[]  1[x]  2[]  3[]  4[]  UN[]    ?7 Limb Ataxia 0[]  1[x]  2[]  3[]  UN[]     ?8 Sensory 0[]  1[x]  2[]  UN[]      ?9 Best Language 0[]  1[]  2[]  3[x]      ?10 Dysarthria 0[]  1[]  2[x]  UN[]      ?11 Extinct. and Inattention 0[]  1[]  2[x]       ?TOTAL: 18   ? ?  ?  ?ROS  ? ?Unable to obtain ROS 2/2 aphasia. ? ?Past History  ? ?Past  Medical History:  ?Diagnosis Date  ? Hypertension   ? ?History reviewed. No pertinent surgical history. ?History reviewed. No pertinent family history. ?Social History  ? ?Socioeconomic History  ? Marital status: Married  ?  Spouse name: Not on file  ? Number of children: Not on file  ? Years of education: Not on file  ? Highest education level: Not on file  ?Occupational History  ? Not on file  ?Tobacco Use  ? Smoking status: Every Day  ? Smokeless tobacco: Never  ?Vaping Use  ? Vaping Use: Never used  ?Substance and Sexual Activity  ? Alcohol use: Yes  ? Drug use: No  ? Sexual activity: Not on file  ?Other Topics Concern  ? Not on file  ?Social History Narrative  ? Not on file  ? ?Social Determinants of Health  ? ?Financial Resource Strain: Not on file  ?Food Insecurity: Not on file  ?Transportation Needs: Not on file  ?Physical Activity: Not on file  ?Stress: Not on file  ?Social Connections: Not on file  ? ?No Known  Allergies ? ?Medications  ?(Not in a hospital admission) ?  ? ?Vitals  ? ?Vitals:  ? 03/02/22 1915 03/02/22 1945 03/02/22 2000 03/02/22 2015  ?BP: 115/64 126/90 136/82 130/70  ?Pulse: 89 91 96 80  ?Resp: 13 16 12 14   ?SpO2: 96% 97% 98% 99%  ?Weight:   87.9 kg   ?  ? ?Body mass index is 26.28 kg/m?. ? ?Physical Exam  ? ?General: Laying comfortably in bed; in no acute distress, appears dishelved. ?HENT: Normal oropharynx and mucosa. Normal external appearance of ears and nose.  ?Neck: Supple, no pain or tenderness  ?CV: No JVD. No peripheral edema.  ?Pulmonary: Symmetric Chest rise. Normal respiratory effort.  ?Abdomen: Soft to touch, non-tender.  ?Ext: No cyanosis, edema, or deformity  ?Skin: No rash. Normal palpation of skin.   ?Musculoskeletal: Normal digits and nails by inspection. No clubbing.  ? ?Neurologic Examination  ?Mental status/Cognition: Alert, does not anwer any orientation questions. ?Speech/language: severely dysarthric and non fluent speech, comprehension intact to simple commands, unable to name objects, unable to repeat. ?Cranial nerves:  ? CN II Pupils equal and reactive to light, R hemianopsia.  ? CN III,IV,VI EOM intact and crosses midline but has R gaze preference.  ? CN V Corneals intact BL  ? CN VII Flattening of the R nasolabial fold.  ? CN VIII normal hearing to speech  ? CN IX & X normal palatal elevation, no uvular deviation  ? CN XI   ? CN XII midline tongue protrusion  ? ?Motor:  ?Muscle bulk: poor, tone normal. ?Mvmt Root Nerve  Muscle Right Left Comments  ?SA C5/6 Ax Deltoid 4 5   ?EF C5/6 Mc Biceps 3 5   ?EE C6/7/8 Rad Triceps 3 5   ?WF C6/7 Med FCR     ?WE C7/8 PIN ECU     ?F Ab C8/T1 U ADM/FDI 3 5   ?HF L1/2/3 Fem Illopsoas 4 5   ?KE L2/3/4 Fem Quad     ?DF L4/5 D Peron Tib Ant 4 5   ?PF S1/2 Tibial Grc/Sol 4 5   ? ?Reflexes: ? Right Left Comments  ?Pectoralis     ? Biceps (C5/6) 2 2   ?Brachioradialis (C5/6) 2 2   ? Triceps (C6/7) 2 2   ? Patellar (L3/4) 2 2   ? Achilles (S1)      ? Hoffman     ? Plantar     ?  Jaw jerk   ? ?Sensation: ? Light touch Decreased in RUE and RLE to touch  ? Pin prick   ? Temperature   ? Vibration   ?Proprioception   ? ?Coordination/Complex Motor:  ?- Finger to Nose intact on the L, ataxic on the R ?- Heel to shin unable to get him to do. ?- Rapid alternating movement: unable to do with RUE ?- Gait: Deferred for patient safety. ? ?Labs  ? ?CBC:  ?Recent Labs  ?Lab 03/02/22 ?1813 03/02/22 ?1822  ?WBC 9.0  --   ?NEUTROABS 7.0  --   ?HGB 17.3* 17.7*  ?HCT 52.2* 52.0  ?MCV 92.7  --   ?PLT 195  --   ? ? ?Basic Metabolic Panel:  ?Lab Results  ?Component Value Date  ? NA 138 03/02/2022  ? K 4.6 03/02/2022  ? CO2 22 03/02/2022  ? GLUCOSE 99 03/02/2022  ? BUN 13 03/02/2022  ? CREATININE 0.90 03/02/2022  ? CALCIUM 8.9 03/02/2022  ? GFRNONAA >60 03/02/2022  ? ?Lipid Panel:  ?Lab Results  ?Component Value Date  ? LDLCALC 120 (H) 08/19/2021  ? ?HgbA1c:  ?Lab Results  ?Component Value Date  ? HGBA1C 5.5 08/19/2021  ? ?Urine Drug Screen:  ?   ?Component Value Date/Time  ? [place] DETECTED 08/19/2021 0748  ? [place] DETECTED 08/19/2021 0748  ? [place] DETECTED 08/19/2021 0748  ? AMPHETMU NONE DETECTED 08/19/2021 0748  ? [place] DETECTED 08/19/2021 0748  ? [place] DETECTED 08/19/2021 0748  ?  ?Alcohol Level  ?   ?Component Value Date/Time  ? ETH <10 03/02/2022 1813  ? ? ?CT Head without contrast(Personally reviewed): ?CTH was negative for a large hypodensity concerning for a large territory infarct or hyperdensity concerning for an ICH ? ?CT angio Head and Neck with contrast(Personally reviewed): ?1. Occlusion of the left ICA, from just distal to the bifurcation ?through the supraclinoid segment, where it reconstitutes, likely ?retrograde. This is new from the prior exam. ?2. Diminished perfusion primarily in the left MCA territory on both ?visual inspection and CT perfusion study, where there is 121 mL of ?ischemic penumbra but no infarct core. ?3.  Chronic occlusion of the left vertebral artery from its origin to ?just proximal to the vertebrobasilar junction. ?4. No other intracranial large vessel occlusion or significant ?stenosis. No other hemodynamically significant sten

## 2022-03-02 NOTE — ED Provider Notes (Signed)
?[facility] ?Provider Note ? ? ?CSN: [id number] ?Arrival date & time: 03/02/22  1758 ? ?  ? ?History ? ?Chief Complaint  ?Patient presents with  ? Seizures  ? ? ?Todd Estrada is a 61 y.o. male with past medical history of hypertension, left MCA CVA (07/2021).  Presents to the emergency department with a chief complaint of seizure-like activity.  EMS reported that patient's roommate witnessed seizure-like activity.  Patient was postictal upon EMS arrival.  Additionally EMS reports that bystander reported that patient is a heavy drinker however has not been drinking "lately."  Unknown last seen normal time. ? ?Patient is unable to answer questions on my exam.  Level 5 caveat applies. ? ? ?Seizures ? ?  ? ?Home Medications ?Prior to Admission medications   ?Medication Sig Start Date End Date Taking? Authorizing Provider  ?aspirin EC 81 MG EC tablet Take 1 tablet (81 mg total) by mouth daily. Swallow whole. 08/21/21   Kathie Dike, MD  ?atorvastatin (LIPITOR) 80 MG tablet Take 1 tablet (80 mg total) by mouth daily. 08/21/21   Kathie Dike, MD  ?metoprolol tartrate (LOPRESSOR) 25 MG tablet Take 0.5 tablets (12.5 mg total) by mouth 2 (two) times daily. 08/21/21   Kathie Dike, MD  ?   ? ?Allergies    ?Patient has no known allergies.   ? ?Review of Systems   ?Review of Systems  ?Unable to perform ROS: Mental status change  ?Neurological:  Positive for seizures.  ? ?Physical Exam ?Updated Vital Signs ?BP 113/73   Pulse 99   Resp (!) 22   SpO2 95%  ?Physical Exam ?Vitals and nursing note reviewed.  ?Constitutional:   ?   General: He is not in acute distress. ?   Appearance: He is not ill-appearing, toxic-appearing or diaphoretic.  ?   Comments: Patient is disheveled with dirty and will find enclosed.  ?HENT:  ?   Head: Normocephalic and atraumatic. No raccoon eyes, Battle's sign, abrasion, contusion, masses or laceration.  ?   Mouth/Throat:  ?   Mouth: No injury or lacerations.  ?    Comments: No oral trauma ?Eyes:  ?   General: No scleral icterus.    ?   Right eye: No discharge.     ?   Left eye: No discharge.  ?   Conjunctiva/sclera: Conjunctivae normal.  ?   Pupils: Pupils are equal, round, and reactive to light.  ?Cardiovascular:  ?   Rate and Rhythm: Normal rate.  ?   Pulses:     ?     Dorsalis pedis pulses are 2+ on the right side and 2+ on the left side.  ?Pulmonary:  ?   Effort: Pulmonary effort is normal. No tachypnea, bradypnea or respiratory distress.  ?   Breath sounds: Normal breath sounds. No stridor.  ?Abdominal:  ?   General: Abdomen is flat. There is no distension. There are no signs of injury.  ?   Palpations: Abdomen is soft. There is no mass or pulsatile mass.  ?   Tenderness: There is no abdominal tenderness. There is no guarding or rebound.  ?Musculoskeletal:  ?   Cervical back: No swelling, edema, deformity, erythema, signs of trauma, lacerations, rigidity, spasms, torticollis, tenderness, bony tenderness or crepitus. No pain with movement. Normal range of motion.  ?   Thoracic back: No swelling, edema, deformity, signs of trauma, lacerations, spasms, tenderness or bony tenderness.  ?   Lumbar back: No swelling, edema, deformity,  signs of trauma, lacerations, spasms, tenderness or bony tenderness.  ?   Comments: No midline tenderness or deformity to cervical, thoracic, lumbar spine.  Superficial abrasion to right lateral thigh and right knee.  ?Feet:  ?   Right foot:  ?   Skin integrity: Callus and dry skin present. No ulcer, blister, skin breakdown, erythema, warmth or fissure.  ?   Toenail Condition: Right toenails are abnormally thick.  ?   Left foot:  ?   Skin integrity: Callus and dry skin present. No ulcer, blister, skin breakdown, erythema, warmth or fissure.  ?   Toenail Condition: Left toenails are abnormally thick.  ?Skin: ?   General: Skin is warm and dry.  ?Neurological:  ?   General: No focal deficit present.  ?   Mental Status: He is alert.  ?   GCS: GCS eye  subscore is 4. GCS verbal subscore is 2. GCS motor subscore is 6.  ?   Comments: Patient is able to follow some commands.  Right-sided weakness to bilateral upper and lower extremity.  Able to hold both left upper and lower limbs against gravity without difficulty.  ?Psychiatric:     ?   Behavior: Behavior is cooperative.  ? ? ?ED Results / Procedures / Treatments   ?Labs ?(all labs ordered are listed, but only abnormal results are displayed) ?Labs Reviewed  ?COMPREHENSIVE METABOLIC PANEL - Abnormal; Notable for the following components:  ?    Result Value  ? Total Protein 6.3 (*)   ? Total Bilirubin 1.4 (*)   ? All other components within normal limits  ?CBC WITH DIFFERENTIAL/PLATELET - Abnormal; Notable for the following components:  ? Hemoglobin 17.3 (*)   ? HCT 52.2 (*)   ? All other components within normal limits  ?CBG MONITORING, ED - Abnormal; Notable for the following components:  ? Glucose-Capillary 121 (*)   ? All other components within normal limits  ?I-STAT CHEM 8, ED - Abnormal; Notable for the following components:  ? Calcium, Ion 1.03 (*)   ? Hemoglobin 17.7 (*)   ? All other components within normal limits  ?ETHANOL  ?RAPID URINE DRUG SCREEN, HOSP PERFORMED  ? ? ?EKG ?None ? ?Radiology ?CT HEAD WO CONTRAST ? ?Result Date: 03/02/2022 ?CLINICAL DATA:  Seizure like activity, postictal, history of alcohol abuse EXAM: CT HEAD WITHOUT CONTRAST TECHNIQUE: Contiguous axial images were obtained from the base of the skull through the vertex without intravenous contrast. RADIATION DOSE REDUCTION: This exam was performed according to the departmental dose-optimization program which includes automated exposure control, adjustment of the mA and/or kV according to patient size and/or use of iterative reconstruction technique. COMPARISON:  08/20/2021 FINDINGS: Brain: Chronic left MCA territory infarct again identified, with interval dystrophic calcification in the infarct bed since prior study. Chronic ischemic  changes are again identified throughout the periventricular and subcortical white matter, left basal ganglia, and left cerebellar hemisphere. No evidence of acute infarct or hemorrhage. Lateral ventricles and remaining midline structures are unremarkable. No acute extra-axial fluid collections. No mass effect. Vascular: No hyperdense vessel or unexpected calcification. Skull: Normal. Negative for fracture or focal lesion. Sinuses/Orbits: Chronic opacification right maxillary sinus. Stable mucosal thickening left maxillary sinus. Other: None. IMPRESSION: 1. Chronic ischemic changes as above. No acute intracranial process. Electronically Signed   By: Randa Ngo M.D.   On: 03/02/2022 19:57  ? ?CT CERVICAL SPINE WO CONTRAST ? ?Result Date: 03/02/2022 ?CLINICAL DATA:  Seizure like activity, history of alcohol abuse, neck trauma  EXAM: CT CERVICAL SPINE WITHOUT CONTRAST TECHNIQUE: Multidetector CT imaging of the cervical spine was performed without intravenous contrast. Multiplanar CT image reconstructions were also generated. RADIATION DOSE REDUCTION: This exam was performed according to the departmental dose-optimization program which includes automated exposure control, adjustment of the mA and/or kV according to patient size and/or use of iterative reconstruction technique. COMPARISON:  None. FINDINGS: Alignment: Alignment is anatomic. Skull base and vertebrae: No acute fracture. No primary bone lesion or focal pathologic process. Soft tissues and spinal canal: No prevertebral fluid or swelling. No visible canal hematoma. Disc levels: Multilevel cervical spondylosis, with prominent anterior osteophyte formation from C4-5 through C6-7. Circumferential disc osteophyte complex at C3-4 and C6-7 results in right greater than left neural foraminal narrowing. Multilevel facet hypertrophy greatest from C4 through C6. Upper chest: Central airways patent.  Severe upper lobe emphysema. Other: Reconstructed images demonstrate  no additional findings. IMPRESSION: 1. No acute cervical spine fracture. 2. Multilevel cervical spondylosis and facet hypertrophy as above. 3. Emphysema. Electronically Signed   By: Randa Ngo M.D.   O

## 2022-03-03 ENCOUNTER — Inpatient Hospital Stay (HOSPITAL_COMMUNITY): Payer: Medicaid Other

## 2022-03-03 DIAGNOSIS — G9341 Metabolic encephalopathy: Secondary | ICD-10-CM

## 2022-03-03 DIAGNOSIS — J9601 Acute respiratory failure with hypoxia: Secondary | ICD-10-CM | POA: Diagnosis not present

## 2022-03-03 DIAGNOSIS — Z978 Presence of other specified devices: Secondary | ICD-10-CM

## 2022-03-03 DIAGNOSIS — R579 Shock, unspecified: Secondary | ICD-10-CM

## 2022-03-03 DIAGNOSIS — I6522 Occlusion and stenosis of left carotid artery: Secondary | ICD-10-CM | POA: Diagnosis present

## 2022-03-03 DIAGNOSIS — I63512 Cerebral infarction due to unspecified occlusion or stenosis of left middle cerebral artery: Secondary | ICD-10-CM

## 2022-03-03 DIAGNOSIS — R569 Unspecified convulsions: Secondary | ICD-10-CM | POA: Diagnosis not present

## 2022-03-03 DIAGNOSIS — E440 Moderate protein-calorie malnutrition: Secondary | ICD-10-CM | POA: Insufficient documentation

## 2022-03-03 DIAGNOSIS — J690 Pneumonitis due to inhalation of food and vomit: Secondary | ICD-10-CM | POA: Diagnosis not present

## 2022-03-03 DIAGNOSIS — L8990 Pressure ulcer of unspecified site, unspecified stage: Secondary | ICD-10-CM | POA: Insufficient documentation

## 2022-03-03 LAB — POCT I-STAT 7, (LYTES, BLD GAS, ICA,H+H)
Acid-Base Excess: 0 mmol/L (ref 0.0–2.0)
Acid-Base Excess: 3 mmol/L — ABNORMAL HIGH (ref 0.0–2.0)
Bicarbonate: 27 mmol/L (ref 20.0–28.0)
Bicarbonate: 31.1 mmol/L — ABNORMAL HIGH (ref 20.0–28.0)
Calcium, Ion: 1.21 mmol/L (ref 1.15–1.40)
Calcium, Ion: 1.36 mmol/L (ref 1.15–1.40)
HCT: 43 % (ref 39.0–52.0)
HCT: 41 % (ref 39.0–52.0)
Hemoglobin: 14.6 g/dL (ref 13.0–17.0)
Hemoglobin: 13.9 g/dL (ref 13.0–17.0)
O2 Saturation: 100 %
O2 Saturation: 100 %
Potassium: 3.6 mmol/L (ref 3.5–5.1)
Potassium: 3 mmol/L — ABNORMAL LOW (ref 3.5–5.1)
Sodium: 138 mmol/L (ref 135–145)
Sodium: 141 mmol/L (ref 135–145)
TCO2: 28 mmol/L (ref 22–32)
TCO2: 33 mmol/L — ABNORMAL HIGH (ref 22–32)
pCO2 arterial: 49.8 mmHg — ABNORMAL HIGH (ref 32–48)
pCO2 arterial: 60.5 mmHg — ABNORMAL HIGH (ref 32–48)
pH, Arterial: 7.342 — ABNORMAL LOW (ref 7.35–7.45)
pH, Arterial: 7.318 — ABNORMAL LOW (ref 7.35–7.45)
pO2, Arterial: 203 mmHg — ABNORMAL HIGH (ref 83–108)
pO2, Arterial: 193 mmHg — ABNORMAL HIGH (ref 83–108)

## 2022-03-03 LAB — MAGNESIUM
Magnesium: 1.4 mg/dL — ABNORMAL LOW (ref 1.7–2.4)
Magnesium: 1.7 mg/dL (ref 1.7–2.4)

## 2022-03-03 LAB — CBC WITH DIFFERENTIAL/PLATELET
Abs Immature Granulocytes: 0.06 10*3/uL (ref 0.00–0.07)
Basophils Absolute: 0 10*3/uL (ref 0.0–0.1)
Basophils Relative: 0 %
Eosinophils Absolute: 0.1 10*3/uL (ref 0.0–0.5)
Eosinophils Relative: 1 %
HCT: 45.5 % (ref 39.0–52.0)
Hemoglobin: 15.2 g/dL (ref 13.0–17.0)
Immature Granulocytes: 1 %
Lymphocytes Relative: 19 %
Lymphs Abs: 1.9 10*3/uL (ref 0.7–4.0)
MCH: 30.6 pg (ref 26.0–34.0)
MCHC: 33.4 g/dL (ref 30.0–36.0)
MCV: 91.5 fL (ref 80.0–100.0)
Monocytes Absolute: 0.8 10*3/uL (ref 0.1–1.0)
Monocytes Relative: 9 %
Neutro Abs: 6.9 10*3/uL (ref 1.7–7.7)
Neutrophils Relative %: 70 %
Platelets: 166 10*3/uL (ref 150–400)
RBC: 4.97 MIL/uL (ref 4.22–5.81)
RDW: 13.6 % (ref 11.5–15.5)
WBC: 9.7 10*3/uL (ref 4.0–10.5)
nRBC: 0 % (ref 0.0–0.2)

## 2022-03-03 LAB — BASIC METABOLIC PANEL
Anion gap: 9 (ref 5–15)
Anion gap: 9 (ref 5–15)
BUN: 6 mg/dL — ABNORMAL LOW (ref 8–23)
BUN: 6 mg/dL — ABNORMAL LOW (ref 8–23)
CO2: 26 mmol/L (ref 22–32)
CO2: 28 mmol/L (ref 22–32)
Calcium: 8.5 mg/dL — ABNORMAL LOW (ref 8.9–10.3)
Calcium: 9.1 mg/dL (ref 8.9–10.3)
Chloride: 101 mmol/L (ref 98–111)
Chloride: 103 mmol/L (ref 98–111)
Creatinine, Ser: 0.75 mg/dL (ref 0.61–1.24)
Creatinine, Ser: 0.78 mg/dL (ref 0.61–1.24)
GFR, Estimated: >60 mL/min (ref >60)
GFR, Estimated: >60 mL/min (ref >60)
Glucose, Bld: 103 mg/dL — ABNORMAL HIGH (ref 70–99)
Glucose, Bld: 144 mg/dL — ABNORMAL HIGH (ref 70–99)
Potassium: 3.5 mmol/L (ref 3.5–5.1)
Potassium: 3.1 mmol/L — ABNORMAL LOW (ref 3.5–5.1)
Sodium: 136 mmol/L (ref 135–145)
Sodium: 140 mmol/L (ref 135–145)

## 2022-03-03 LAB — GLUCOSE, CAPILLARY
Glucose-Capillary: 116 mg/dL — ABNORMAL HIGH (ref 70–99)
Glucose-Capillary: 129 mg/dL — ABNORMAL HIGH (ref 70–99)
Glucose-Capillary: 161 mg/dL — ABNORMAL HIGH (ref 70–99)

## 2022-03-03 LAB — PHOSPHORUS
Phosphorus: 4.4 mg/dL (ref 2.5–4.6)
Phosphorus: 4 mg/dL (ref 2.5–4.6)

## 2022-03-03 LAB — LIPID PANEL
Cholesterol: 115 mg/dL (ref 0–200)
HDL: 34 mg/dL — ABNORMAL LOW (ref >40)
LDL Cholesterol: 60 mg/dL (ref 0–99)
Total CHOL/HDL Ratio: 3.4 RATIO
Triglycerides: 104 mg/dL (ref <150)
VLDL: 21 mg/dL (ref 0–40)

## 2022-03-03 LAB — MRSA NEXT GEN BY PCR, NASAL: MRSA by PCR Next Gen: NOT DETECTED

## 2022-03-03 LAB — CBC
HCT: 44.6 % (ref 39.0–52.0)
HCT: 43 % (ref 39.0–52.0)
Hemoglobin: 14.4 g/dL (ref 13.0–17.0)
Hemoglobin: 13.8 g/dL (ref 13.0–17.0)
MCH: 30.3 pg (ref 26.0–34.0)
MCH: 30.1 pg (ref 26.0–34.0)
MCHC: 32.3 g/dL (ref 30.0–36.0)
MCHC: 32.1 g/dL (ref 30.0–36.0)
MCV: 93.9 fL (ref 80.0–100.0)
MCV: 93.9 fL (ref 80.0–100.0)
Platelets: 158 10*3/uL (ref 150–400)
Platelets: 132 10*3/uL — ABNORMAL LOW (ref 150–400)
RBC: 4.75 MIL/uL (ref 4.22–5.81)
RBC: 4.58 MIL/uL (ref 4.22–5.81)
RDW: 13.6 % (ref 11.5–15.5)
RDW: 13.7 % (ref 11.5–15.5)
WBC: 8.2 10*3/uL (ref 4.0–10.5)
WBC: 8.1 10*3/uL (ref 4.0–10.5)
nRBC: 0 % (ref 0.0–0.2)
nRBC: 0 % (ref 0.0–0.2)

## 2022-03-03 LAB — LACTIC ACID, PLASMA: Lactic Acid, Venous: 1.4 mmol/L (ref 0.5–1.9)

## 2022-03-03 LAB — RAPID URINE DRUG SCREEN, HOSP PERFORMED
Amphetamines: NOT DETECTED
Barbiturates: NOT DETECTED
Benzodiazepines: NOT DETECTED
Cocaine: NOT DETECTED
Opiates: NOT DETECTED
Tetrahydrocannabinol: NOT DETECTED

## 2022-03-03 LAB — ECHOCARDIOGRAM COMPLETE
Calc EF: 39.4 %
Height: 72 in
Single Plane A2C EF: 36.5 %
Single Plane A4C EF: 40.8 %
Weight: 2987.67 oz

## 2022-03-03 LAB — CREATININE, SERUM
Creatinine, Ser: 0.74 mg/dL (ref 0.61–1.24)
GFR, Estimated: >60 mL/min (ref >60)

## 2022-03-03 IMAGING — DX DG CHEST 1V PORT
1 series · 2 of 2 positions shown · non-contrast
Comparison: Radiograph [DATE]

CLINICAL DATA: ETT and OG tube placement

EXAM:
PORTABLE CHEST 1 VIEW

[Series 1: chest · 0.14mm/px · 2 of 2 slices shown]
[im 1/2]
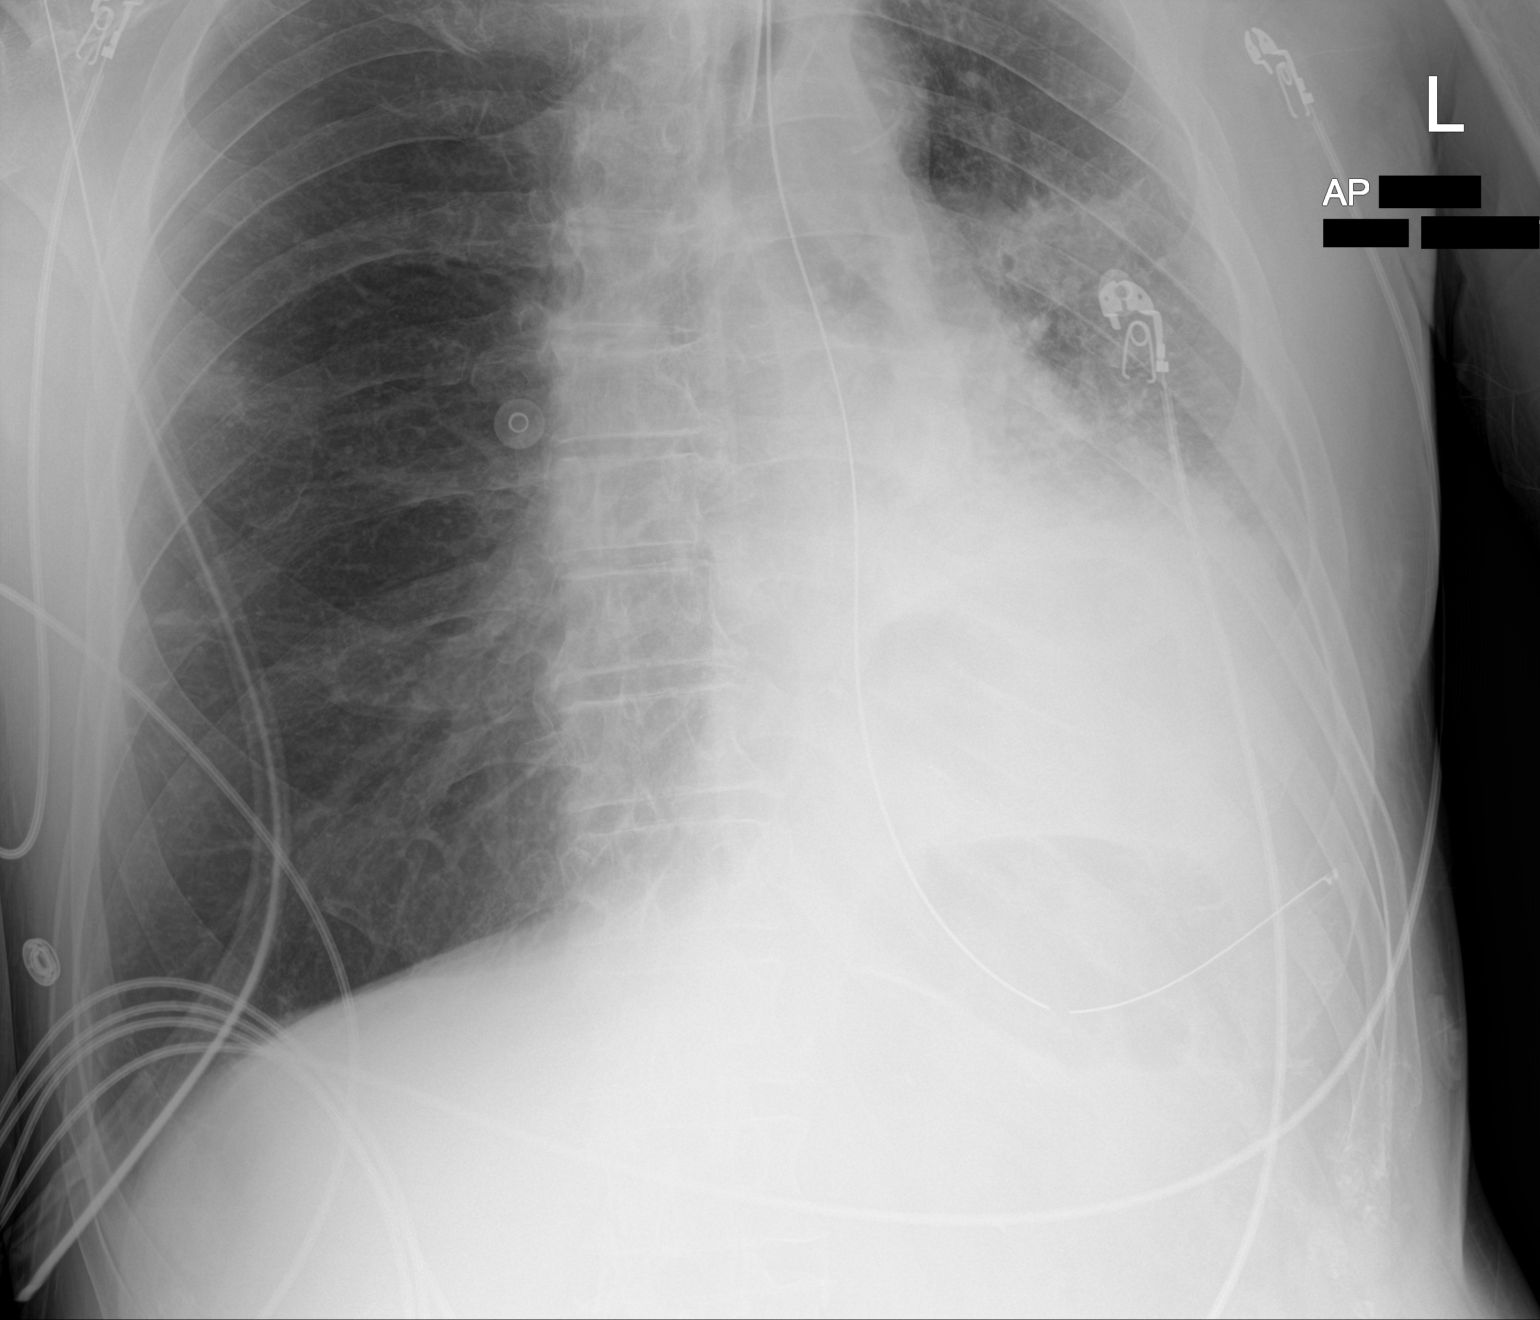
[im 2/2]
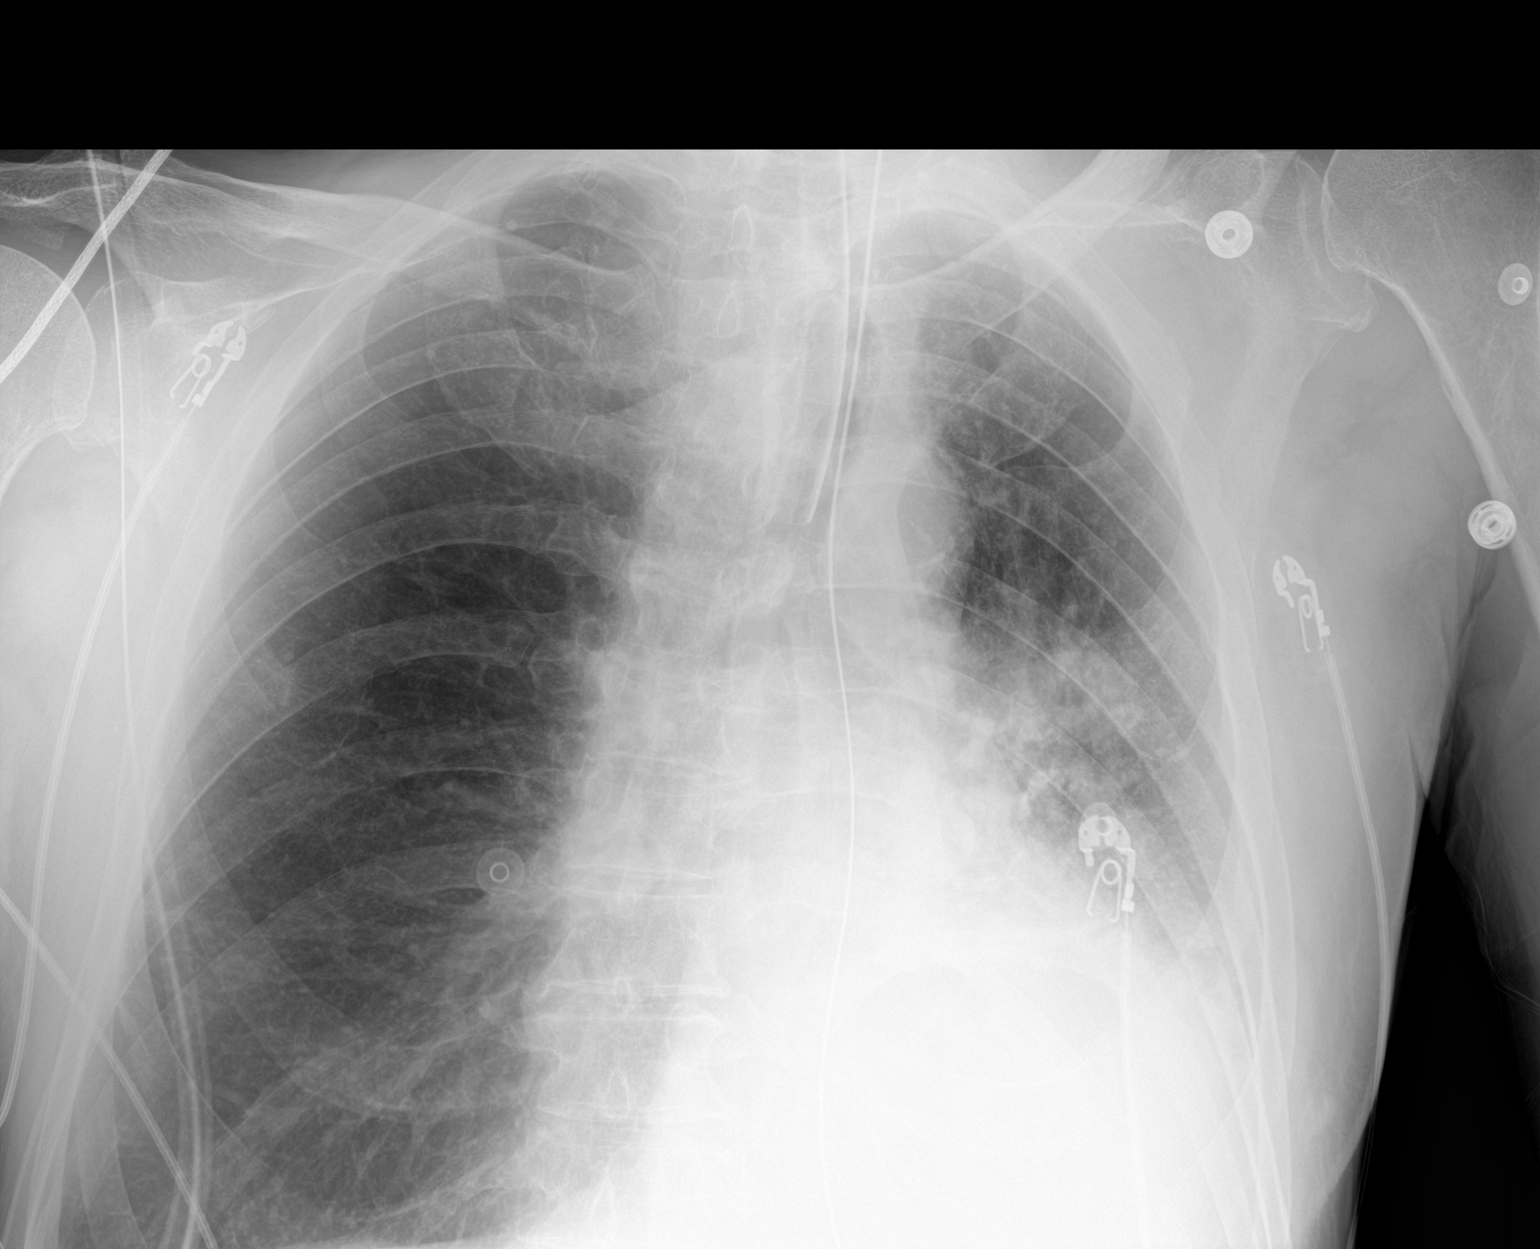

[2 of 2 positions shown; findings below may reference images not displayed]

FINDINGS: Endotracheal tube overlies the midthoracic trachea. Orogastric tube
tip and side port overlie the stomach. Obscured cardiomediastinal
silhouette on the left. The patient is rotated but there appears to
be leftward mediastinal shift. There is a small to moderate size
left pleural effusion. There is left mid to lower lung airspace
consolidation. Elevated left hemidiaphragm. There is a nodular
opacity overlying the right peripheral mid lung which measures
cm.
IMPRESSION: Small to moderate size left pleural effusion with increased left mid
to lower lung opacities and persistent right peripheral mid lung
patchy opacity. Findings are concerning for pneumonia/aspiration.

The right peripheral opacity may or may not be related to the new
process in the left mid to lower lung. Recommend continued
radiographic follow-up, this may require chest CT for further
evaluation.

## 2022-03-03 IMAGING — DX DG CHEST 1V PORT
1 series · 1 of 1 positions shown · non-contrast
Comparison: Previous studies including the examination done earlier
today

CLINICAL DATA: Difficulty breathing

EXAM:
PORTABLE CHEST 1 VIEW

[chest]
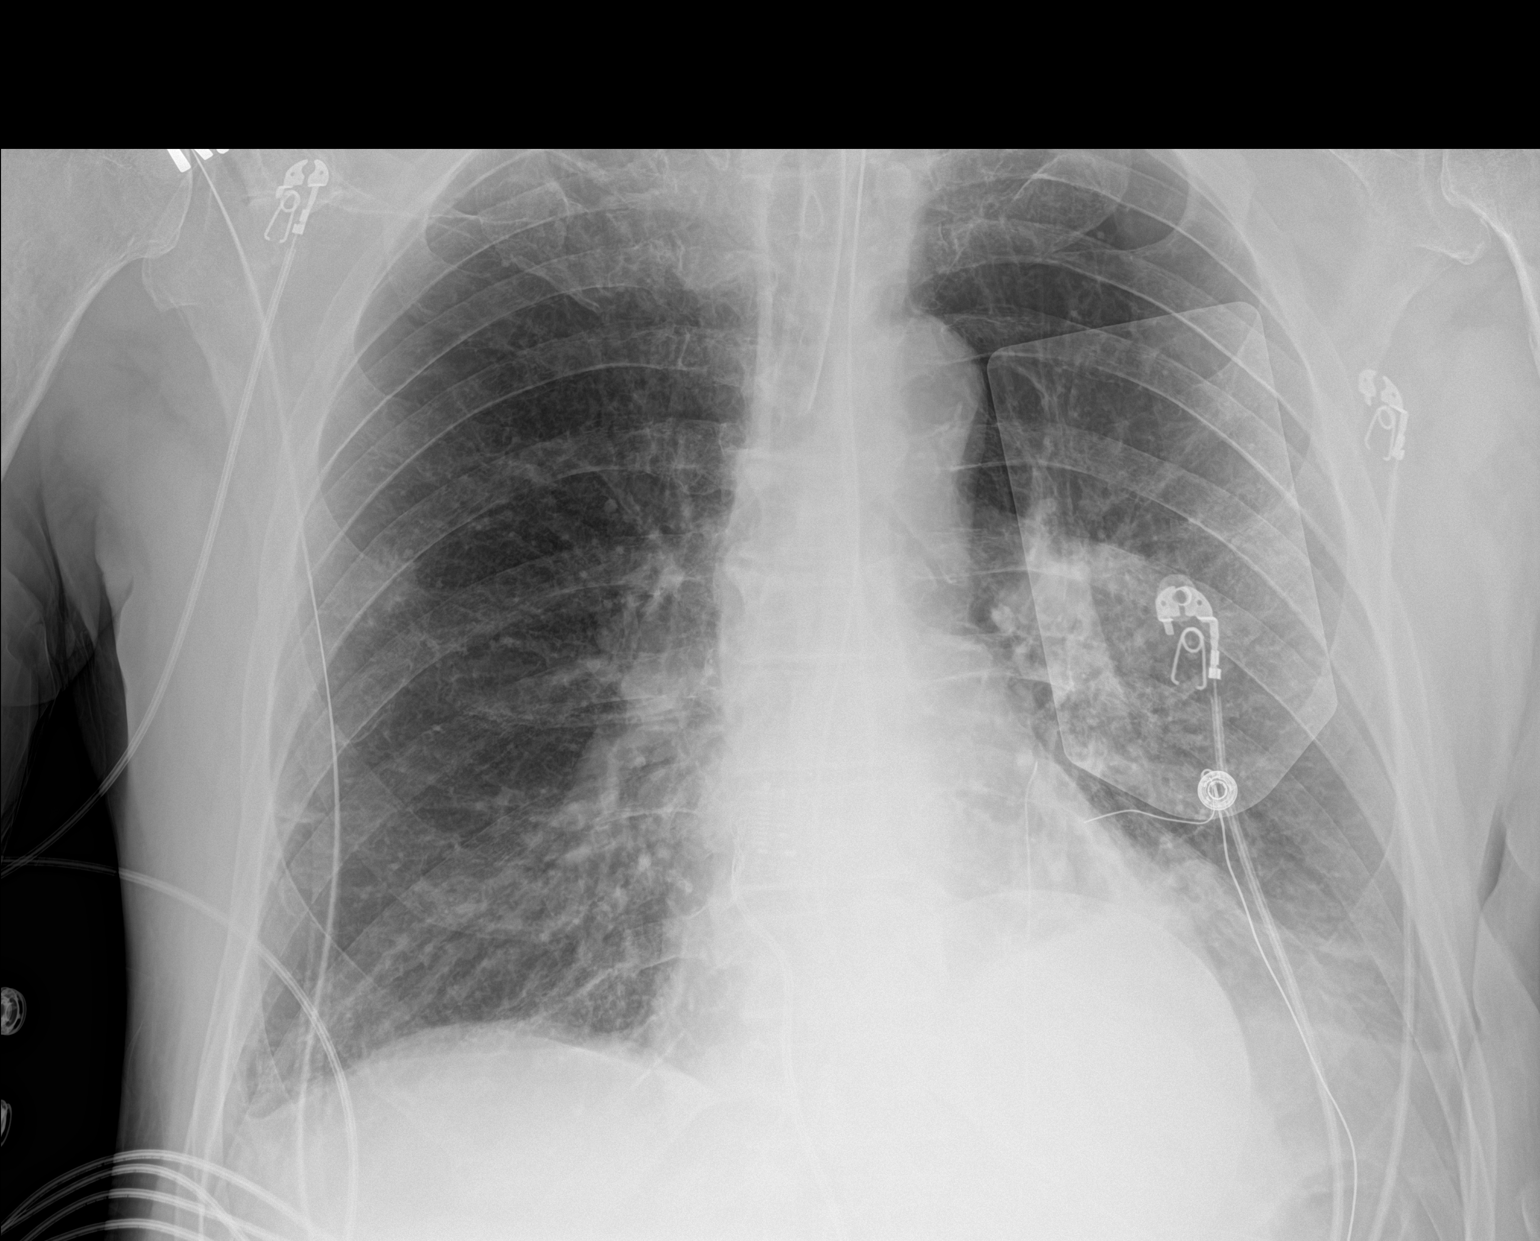

[1 of 1 positions shown; findings below may reference images not displayed]

FINDINGS: Tip of endotracheal tube is 2.7 cm above the carina. Enteric tube is
noted traversing the esophagus. There is interval improvement in
aeration of left lower lung fields suggesting decrease in pleural
effusion and decrease in infiltrates. There is faint 1.7 cm
radiopacity in the right mid lung fields partly superimposed over
the anterior right fourth rib which has not changed. Small
transverse linear density in the right lower lung fields may suggest
scarring or subsegmental atelectasis. There is minimal blunting of
both lateral CP angles. There is no pneumothorax.
IMPRESSION: There is interval improvement in aeration of left lower lung fields
suggesting decrease in pleural effusion and decrease in underlying
infiltrates. There is blunting of both lateral CP angles suggesting
small bilateral effusions. There is no pneumothorax.

There is a faint 1.7 cm radiopacity in the lateral aspect of right
mid lung fields which may suggest focal pneumonitis or pleural
thickening or neoplastic process. Follow-up chest radiographs and CT
as warranted should be considered.

## 2022-03-03 MED ORDER — LORAZEPAM 2 MG/ML IJ SOLN
1.0000 mg | INTRAMUSCULAR | Status: DC | PRN
  Administered 2022-03-03 – 2022-03-04 (×6): 2 mg via INTRAVENOUS
  Administered 2022-03-04: 1 mg via INTRAVENOUS
  Filled 2022-03-03 (×7): qty 1

## 2022-03-03 MED ORDER — SODIUM CHLORIDE 0.9 % IV SOLN: INTRAVENOUS | Status: DC

## 2022-03-03 MED ORDER — PROPOFOL 1000 MG/100ML IV EMUL
5.0000 ug/kg/min | INTRAVENOUS | Status: DC
  Filled 2022-03-03: qty 100

## 2022-03-03 MED ORDER — NOREPINEPHRINE 4 MG/250ML-% IV SOLN
INTRAVENOUS | Status: AC
  Filled 2022-03-03: qty 250

## 2022-03-03 MED ORDER — ORAL CARE MOUTH RINSE: 15.0000 mL | Freq: Two times a day (BID) | OROMUCOSAL | Status: DC

## 2022-03-03 MED ORDER — CALCIUM CHLORIDE 10 % IV SOLN
INTRAVENOUS | Status: AC
  Administered 2022-03-03: 1000 mg
  Filled 2022-03-03: qty 10

## 2022-03-03 MED ORDER — LORAZEPAM 2 MG/ML IJ SOLN
INTRAMUSCULAR | Status: AC
  Administered 2022-03-03: 2 mg via INTRAVENOUS
  Filled 2022-03-03: qty 1

## 2022-03-03 MED ORDER — FENTANYL CITRATE PF 50 MCG/ML IJ SOSY
PREFILLED_SYRINGE | INTRAMUSCULAR | Status: AC
  Filled 2022-03-03: qty 2

## 2022-03-03 MED ORDER — ROCURONIUM BROMIDE 10 MG/ML (PF) SYRINGE
PREFILLED_SYRINGE | INTRAVENOUS | Status: AC
  Administered 2022-03-03: 90 mg
  Filled 2022-03-03: qty 10

## 2022-03-03 MED ORDER — MAGNESIUM SULFATE 4 GM/100ML IV SOLN
4.0000 g | Freq: Once | INTRAVENOUS | Status: AC
  Administered 2022-03-03: 4 g via INTRAVENOUS
  Filled 2022-03-03: qty 100

## 2022-03-03 MED ORDER — ATORVASTATIN CALCIUM 40 MG PO TABS
40.0000 mg | ORAL_TABLET | Freq: Every day | ORAL | Status: DC
  Administered 2022-03-04 – 2022-03-05 (×2): 40 mg
  Filled 2022-03-03 (×2): qty 1

## 2022-03-03 MED ORDER — TICAGRELOR 90 MG PO TABS: 90.0000 mg | ORAL_TABLET | Freq: Two times a day (BID) | ORAL | Status: DC

## 2022-03-03 MED ORDER — ASPIRIN 81 MG PO CHEW: 81.0000 mg | CHEWABLE_TABLET | Freq: Every day | ORAL | Status: DC

## 2022-03-03 MED ORDER — FENTANYL CITRATE PF 50 MCG/ML IJ SOSY
50.0000 ug | PREFILLED_SYRINGE | INTRAMUSCULAR | Status: AC | PRN
  Administered 2022-03-03 (×3): 50 ug via INTRAVENOUS
  Filled 2022-03-03 (×3): qty 1

## 2022-03-03 MED ORDER — ROCURONIUM BROMIDE 10 MG/ML (PF) SYRINGE
PREFILLED_SYRINGE | INTRAVENOUS | Status: AC
  Administered 2022-03-03: 100 mg
  Filled 2022-03-03: qty 10

## 2022-03-03 MED ORDER — ACETAMINOPHEN 650 MG RE SUPP
650.0000 mg | RECTAL | Status: DC | PRN
  Administered 2022-03-07 – 2022-03-08 (×3): 650 mg via RECTAL
  Filled 2022-03-03 (×4): qty 1

## 2022-03-03 MED ORDER — ACETAMINOPHEN 160 MG/5ML PO SOLN
650.0000 mg | ORAL | Status: DC | PRN
  Administered 2022-03-11 – 2022-03-22 (×3): 650 mg
  Filled 2022-03-03 (×3): qty 20.3

## 2022-03-03 MED ORDER — FOLIC ACID 1 MG PO TABS: 1.0000 mg | ORAL_TABLET | Freq: Every day | ORAL | Status: DC

## 2022-03-03 MED ORDER — CHLORHEXIDINE GLUCONATE 0.12% ORAL RINSE (MEDLINE KIT)
15.0000 mL | Freq: Two times a day (BID) | OROMUCOSAL | Status: DC
  Administered 2022-03-03 – 2022-03-05 (×5): 15 mL via OROMUCOSAL

## 2022-03-03 MED ORDER — OSMOLITE 1.5 CAL PO LIQD
1000.0000 mL | ORAL | Status: DC
  Administered 2022-03-03 – 2022-03-05 (×3): 1000 mL

## 2022-03-03 MED ORDER — ASPIRIN 300 MG RE SUPP: 300.0000 mg | Freq: Every day | RECTAL | Status: DC

## 2022-03-03 MED ORDER — POTASSIUM CHLORIDE 20 MEQ PO PACK
40.0000 meq | PACK | Freq: Once | ORAL | Status: AC
  Administered 2022-03-03: 40 meq
  Filled 2022-03-03: qty 2

## 2022-03-03 MED ORDER — ETOMIDATE 2 MG/ML IV SOLN
INTRAVENOUS | Status: AC
  Administered 2022-03-03: 20 mg
  Filled 2022-03-03: qty 20

## 2022-03-03 MED ORDER — POTASSIUM CHLORIDE 10 MEQ/100ML IV SOLN: 10.0000 meq | INTRAVENOUS | Status: DC

## 2022-03-03 MED ORDER — PROSOURCE TF PO LIQD
90.0000 mL | Freq: Three times a day (TID) | ORAL | Status: DC
  Administered 2022-03-03 – 2022-03-05 (×8): 90 mL
  Filled 2022-03-03 (×8): qty 90

## 2022-03-03 MED ORDER — FOLIC ACID 5 MG/ML IJ SOLN
1.0000 mg | Freq: Every day | INTRAMUSCULAR | Status: DC
  Filled 2022-03-03: qty 0.2

## 2022-03-03 MED ORDER — ADULT MULTIVITAMIN W/MINERALS CH
1.0000 | ORAL_TABLET | Freq: Every day | ORAL | Status: DC
  Administered 2022-03-04 – 2022-03-05 (×2): 1
  Filled 2022-03-03 (×2): qty 1

## 2022-03-03 MED ORDER — ADULT MULTIVITAMIN W/MINERALS CH: 1.0000 | ORAL_TABLET | Freq: Every day | ORAL | Status: DC

## 2022-03-03 MED ORDER — FENTANYL 2500MCG IN NS 250ML (10MCG/ML) PREMIX INFUSION
0.0000 ug/h | INTRAVENOUS | Status: DC
  Filled 2022-03-03: qty 250

## 2022-03-03 MED ORDER — EPINEPHRINE 0.1 MG/10ML (10 MCG/ML) SYRINGE FOR IV PUSH (FOR BLOOD PRESSURE SUPPORT)
10.0000 ug | PREFILLED_SYRINGE | Freq: Once | INTRAVENOUS | Status: AC | PRN
  Administered 2022-03-03: 10 ug via INTRAVENOUS
  Filled 2022-03-03: qty 10

## 2022-03-03 MED ORDER — FOLIC ACID 1 MG PO TABS
1.0000 mg | ORAL_TABLET | Freq: Every day | ORAL | Status: DC
  Administered 2022-03-04 – 2022-03-05 (×2): 1 mg
  Filled 2022-03-03 (×2): qty 1

## 2022-03-03 MED ORDER — CHLORHEXIDINE GLUCONATE CLOTH 2 % EX PADS
6.0000 | MEDICATED_PAD | Freq: Every day | CUTANEOUS | Status: DC
  Administered 2022-03-03 – 2022-03-21 (×20): 6 via TOPICAL

## 2022-03-03 MED ORDER — SODIUM CHLORIDE 0.9 % IV SOLN
3.0000 g | Freq: Four times a day (QID) | INTRAVENOUS | Status: AC
  Administered 2022-03-03 – 2022-03-10 (×28): 3 g via INTRAVENOUS
  Filled 2022-03-03 (×28): qty 8

## 2022-03-03 MED ORDER — PANTOPRAZOLE SODIUM 40 MG IV SOLR
40.0000 mg | Freq: Every day | INTRAVENOUS | Status: DC
  Administered 2022-03-03: 40 mg via INTRAVENOUS
  Filled 2022-03-03: qty 10

## 2022-03-03 MED ORDER — DOCUSATE SODIUM 50 MG/5ML PO LIQD
100.0000 mg | Freq: Two times a day (BID) | ORAL | Status: DC
  Administered 2022-03-03 – 2022-03-05 (×5): 100 mg
  Filled 2022-03-03 (×5): qty 10

## 2022-03-03 MED ORDER — THIAMINE HCL 100 MG PO TABS
100.0000 mg | ORAL_TABLET | Freq: Every day | ORAL | Status: DC
  Administered 2022-03-04 – 2022-03-05 (×2): 100 mg
  Filled 2022-03-03 (×2): qty 1

## 2022-03-03 MED ORDER — TICAGRELOR 90 MG PO TABS
90.0000 mg | ORAL_TABLET | Freq: Two times a day (BID) | ORAL | Status: DC
  Administered 2022-03-03: 90 mg
  Filled 2022-03-03 (×2): qty 1

## 2022-03-03 MED ORDER — NOREPINEPHRINE 4 MG/250ML-% IV SOLN
0.0000 ug/min | INTRAVENOUS | Status: DC
  Administered 2022-03-03: 1 ug/min via INTRAVENOUS
  Administered 2022-03-03: 10 ug/min via INTRAVENOUS

## 2022-03-03 MED ORDER — FOLIC ACID 1 MG PO TABS
1.0000 mg | ORAL_TABLET | Freq: Every day | ORAL | Status: DC
  Administered 2022-03-03: 1 mg
  Filled 2022-03-03: qty 1

## 2022-03-03 MED ORDER — ACETAMINOPHEN 325 MG PO TABS
650.0000 mg | ORAL_TABLET | ORAL | Status: DC | PRN
  Administered 2022-03-21: 650 mg via ORAL
  Filled 2022-03-03: qty 2

## 2022-03-03 MED ORDER — DEXMEDETOMIDINE HCL IN NACL 400 MCG/100ML IV SOLN
0.0000 ug/kg/h | INTRAVENOUS | Status: DC
  Administered 2022-03-03 (×2): 0.4 ug/kg/h via INTRAVENOUS
  Administered 2022-03-03: 0.2 ug/kg/h via INTRAVENOUS
  Filled 2022-03-03 (×2): qty 100

## 2022-03-03 MED ORDER — DEXMEDETOMIDINE HCL IN NACL 400 MCG/100ML IV SOLN
INTRAVENOUS | Status: AC
  Filled 2022-03-03: qty 100

## 2022-03-03 MED ORDER — TICAGRELOR 90 MG PO TABS
90.0000 mg | ORAL_TABLET | Freq: Two times a day (BID) | ORAL | Status: DC
  Administered 2022-03-03 – 2022-03-05 (×4): 90 mg
  Filled 2022-03-03 (×4): qty 1

## 2022-03-03 MED ORDER — MIDAZOLAM HCL 2 MG/2ML IJ SOLN
INTRAMUSCULAR | Status: AC
  Filled 2022-03-03: qty 2

## 2022-03-03 MED ORDER — SODIUM CHLORIDE 0.9 % IV BOLUS
500.0000 mL | Freq: Once | INTRAVENOUS | Status: AC
  Administered 2022-03-03: 500 mL via INTRAVENOUS

## 2022-03-03 MED ORDER — ORAL CARE MOUTH RINSE
15.0000 mL | OROMUCOSAL | Status: DC
  Administered 2022-03-03 – 2022-03-05 (×20): 15 mL via OROMUCOSAL

## 2022-03-03 MED ORDER — PANTOPRAZOLE 2 MG/ML SUSPENSION
40.0000 mg | Freq: Every day | ORAL | Status: DC
  Administered 2022-03-04 – 2022-03-05 (×2): 40 mg
  Filled 2022-03-03 (×2): qty 20

## 2022-03-03 MED ORDER — ATORVASTATIN CALCIUM 80 MG PO TABS: 80.0000 mg | ORAL_TABLET | Freq: Every day | ORAL | Status: DC

## 2022-03-03 MED ORDER — LORAZEPAM 2 MG/ML IJ SOLN
2.0000 mg | Freq: Once | INTRAMUSCULAR | Status: DC
  Filled 2022-03-03 (×2): qty 1

## 2022-03-03 MED ORDER — POLYETHYLENE GLYCOL 3350 17 G PO PACK
17.0000 g | PACK | Freq: Every day | ORAL | Status: DC
  Administered 2022-03-03 – 2022-03-04 (×2): 17 g
  Filled 2022-03-03 (×2): qty 1

## 2022-03-03 MED ORDER — THIAMINE HCL 100 MG/ML IJ SOLN
100.0000 mg | Freq: Every day | INTRAMUSCULAR | Status: DC
  Administered 2022-03-03: 100 mg via INTRAVENOUS
  Filled 2022-03-03: qty 2

## 2022-03-03 MED ORDER — CLEVIDIPINE BUTYRATE 0.5 MG/ML IV EMUL
0.0000 mg/h | INTRAVENOUS | Status: DC
  Administered 2022-03-03: 6 mg/h via INTRAVENOUS
  Administered 2022-03-03: 2 mg/h via INTRAVENOUS
  Filled 2022-03-03: qty 50

## 2022-03-03 MED ORDER — CHLORHEXIDINE GLUCONATE 0.12 % MT SOLN
15.0000 mL | Freq: Two times a day (BID) | OROMUCOSAL | Status: DC
  Administered 2022-03-03 (×2): 15 mL via OROMUCOSAL
  Filled 2022-03-03: qty 15

## 2022-03-03 MED ORDER — LACTATED RINGERS IV BOLUS
1000.0000 mL | Freq: Once | INTRAVENOUS | Status: AC
  Administered 2022-03-03: 1000 mL via INTRAVENOUS

## 2022-03-03 MED ORDER — SODIUM BICARBONATE 8.4 % IV SOLN
INTRAVENOUS | Status: AC
  Administered 2022-03-03: 100 meq
  Filled 2022-03-03: qty 100

## 2022-03-03 MED ORDER — SUCCINYLCHOLINE CHLORIDE 200 MG/10ML IV SOSY
PREFILLED_SYRINGE | INTRAVENOUS | Status: AC
  Filled 2022-03-03: qty 10

## 2022-03-03 MED ORDER — ASPIRIN 81 MG PO CHEW
81.0000 mg | CHEWABLE_TABLET | Freq: Every day | ORAL | Status: DC
  Administered 2022-03-03 – 2022-03-05 (×3): 81 mg
  Filled 2022-03-03 (×3): qty 1

## 2022-03-03 MED ORDER — MAGNESIUM SULFATE 2 GM/50ML IV SOLN
2.0000 g | Freq: Once | INTRAVENOUS | Status: AC
  Administered 2022-03-03: 2 g via INTRAVENOUS
  Filled 2022-03-03: qty 50

## 2022-03-03 MED ORDER — THIAMINE HCL 100 MG PO TABS: 100.0000 mg | ORAL_TABLET | Freq: Every day | ORAL | Status: DC

## 2022-03-03 MED ORDER — SENNOSIDES-DOCUSATE SODIUM 8.6-50 MG PO TABS: 1.0000 | ORAL_TABLET | Freq: Every evening | ORAL | Status: DC | PRN

## 2022-03-03 NOTE — Progress Notes (Signed)
Extubated after IR and transferred to Neuro ICU. Agitated in the ICU with tremors. Will do Ativan PRN for concern for potential alcohol withdrawal. ? ?Todd Estrada ?[facility] ?Pager Number [phone] ?

## 2022-03-03 NOTE — Progress Notes (Signed)
Patient belongings include:  ?Jacket  ?Shirt ?Wrist brace ?Belt  ?Wallet - $34 cash  ?

## 2022-03-03 NOTE — Progress Notes (Addendum)
STROKE TEAM PROGRESS NOTE  ? ?INTERVAL HISTORY ?Patient is seen in his room with no family at the bedside.  Yesterday, he was found to have had a seizure and was taken to the ED.  He was then found to have right sided weakness, slurred speech and expressive aphasia.  He was found to have left ICA occlusion on CTA head.  He was then taken for IR thrombectomy.  Left ICA was revascularized, and TICI 2c flow was achieved in left MCA M1 segment.  He was extubated after the procedure but was reintubated this morning for inability to protect his airway.   ? ?Vitals:  ? 03/03/22 1000 03/03/22 1054 03/03/22 1100 03/03/22 1200  ?BP: (!) 143/82  (!) 153/86 101/65  ?Pulse: 89  88   ?Resp: (!) 29 16 16 16   ?Temp:      ?TempSrc:      ?SpO2: 100% 100% 100% 100%  ?Weight: 84.7 kg     ?Height: 6' (1.829 m)     ? ?CBC:  ?Recent Labs  ?Lab 03/02/22 ?1813 03/02/22 ?1822 03/03/22 ?0101 03/03/22 ?0430 03/03/22 ?1152  ?WBC 9.0  --  8.2 9.7  --   ?NEUTROABS 7.0  --   --  6.9  --   ?HGB 17.3*   < > 14.4 15.2 14.6  ?HCT 52.2*   < > 44.6 45.5 43.0  ?MCV 92.7  --  93.9 91.5  --   ?PLT 195  --  158 166  --   ? < > = values in this interval not displayed.  ? ?Basic Metabolic Panel:  ?Recent Labs  ?Lab 03/02/22 ?1813 03/02/22 ?1822 03/03/22 ?0101 03/03/22 ?0430 03/03/22 ?1152  ?NA 135 138  --  136 138  ?K 4.5 4.6  --  3.5 3.6  ?CL 101 102  --  101  --   ?CO2 22  --   --  26  --   ?GLUCOSE 97 99  --  103*  --   ?BUN 11 13  --  6*  --   ?CREATININE 0.99 0.90 0.74 0.75  --   ?CALCIUM 8.9  --   --  8.5*  --   ? ?Lipid Panel:  ?Recent Labs  ?Lab 03/03/22 ?0430  ?CHOL 115  ?TRIG 104  ?HDL 34*  ?CHOLHDL 3.4  ?VLDL 21  ?[place] 60  ? ?HgbA1c: No results for input(s): HGBA1C in the last 168 hours. ?Urine Drug Screen:  ?Recent Labs  ?Lab 03/03/22 ?3235  ?LABOPIA NONE DETECTED  ?COCAINSCRNUR NONE DETECTED  ?LABBENZ NONE DETECTED  ?AMPHETMU NONE DETECTED  ?THCU NONE DETECTED  ?LABBARB NONE DETECTED  ?  ?Alcohol Level  ?Recent Labs  ?Lab 03/02/22 ?1813  ?ETH  <10  ? ? ?IMAGING past 24 hours ?CT HEAD WO CONTRAST ? ?Result Date: 03/02/2022 ?CLINICAL DATA:  Seizure like activity, postictal, history of alcohol abuse EXAM: CT HEAD WITHOUT CONTRAST TECHNIQUE: Contiguous axial images were obtained from the base of the skull through the vertex without intravenous contrast. RADIATION DOSE REDUCTION: This exam was performed according to the departmental dose-optimization program which includes automated exposure control, adjustment of the mA and/or kV according to patient size and/or use of iterative reconstruction technique. COMPARISON:  08/20/2021 FINDINGS: Brain: Chronic left MCA territory infarct again identified, with interval dystrophic calcification in the infarct bed since prior study. Chronic ischemic changes are again identified throughout the periventricular and subcortical white matter, left basal ganglia, and left cerebellar hemisphere. No evidence of acute infarct or hemorrhage. Lateral  ventricles and remaining midline structures are unremarkable. No acute extra-axial fluid collections. No mass effect. Vascular: No hyperdense vessel or unexpected calcification. Skull: Normal. Negative for fracture or focal lesion. Sinuses/Orbits: Chronic opacification right maxillary sinus. Stable mucosal thickening left maxillary sinus. Other: None. IMPRESSION: 1. Chronic ischemic changes as above. No acute intracranial process. Electronically Signed   By: Randa Ngo M.D.   On: 03/02/2022 19:57  ? ?CT CERVICAL SPINE WO CONTRAST ? ?Result Date: 03/02/2022 ?CLINICAL DATA:  Seizure like activity, history of alcohol abuse, neck trauma EXAM: CT CERVICAL SPINE WITHOUT CONTRAST TECHNIQUE: Multidetector CT imaging of the cervical spine was performed without intravenous contrast. Multiplanar CT image reconstructions were also generated. RADIATION DOSE REDUCTION: This exam was performed according to the departmental dose-optimization program which includes automated exposure control,  adjustment of the mA and/or kV according to patient size and/or use of iterative reconstruction technique. COMPARISON:  None. FINDINGS: Alignment: Alignment is anatomic. Skull base and vertebrae: No acute fracture. No primary bone lesion or focal pathologic process. Soft tissues and spinal canal: No prevertebral fluid or swelling. No visible canal hematoma. Disc levels: Multilevel cervical spondylosis, with prominent anterior osteophyte formation from C4-5 through C6-7. Circumferential disc osteophyte complex at C3-4 and C6-7 results in right greater than left neural foraminal narrowing. Multilevel facet hypertrophy greatest from C4 through C6. Upper chest: Central airways patent.  Severe upper lobe emphysema. Other: Reconstructed images demonstrate no additional findings. IMPRESSION: 1. No acute cervical spine fracture. 2. Multilevel cervical spondylosis and facet hypertrophy as above. 3. Emphysema. Electronically Signed   By: Randa Ngo M.D.   On: 03/02/2022 20:00  ? ?DG CHEST PORT 1 VIEW ? ?Result Date: 03/03/2022 ?CLINICAL DATA:  ETT and OG tube placement EXAM: PORTABLE CHEST 1 VIEW COMPARISON:  Radiograph 03/02/2022 FINDINGS: Endotracheal tube overlies the midthoracic trachea. Orogastric tube tip and side port overlie the stomach. Obscured cardiomediastinal silhouette on the left. The patient is rotated but there appears to be leftward mediastinal shift. There is a small to moderate size left pleural effusion. There is left mid to lower lung airspace consolidation. Elevated left hemidiaphragm. There is a nodular opacity overlying the right peripheral mid lung which measures 1.7 cm. IMPRESSION: Small to moderate size left pleural effusion with increased left mid to lower lung opacities and persistent right peripheral mid lung patchy opacity. Findings are concerning for pneumonia/aspiration. The right peripheral opacity may or may not be related to the new process in the left mid to lower lung. Recommend  continued radiographic follow-up, this may require chest CT for further evaluation. Electronically Signed   By: Maurine Simmering M.D.   On: 03/03/2022 11:44  ? ?DG Chest Portable 1 View ? ?Result Date: 03/02/2022 ?CLINICAL DATA:  Seizure like activity.  Cough. EXAM: PORTABLE CHEST 1 VIEW COMPARISON:  None. FINDINGS: Heart size is normal. Artifact overlies the chest. Patient probably has chronic lung disease with possible emphysema and chronic lung markings. Few areas of patchy density in the mid and lower lungs could possibly represent mild pneumonia. I would suggest a two-view chest radiography exam be performed when able, particularly with respect to a possible nodular shadow in the right mid lung which could be better evaluated at that time. No effusion. No acute bone finding. IMPRESSION: Probable chronic lung disease. Emphysema and pulmonary scarring suspected. Question few scattered patchy densities in the mid and lower lungs that could possibly be inflammatory, but two-view chest radiography is recommended when able to assess for the possibility of pulmonary nodules.  Electronically Signed   By: Nelson Chimes M.D.   On: 03/02/2022 18:38  ? ?EEG adult ? ?Result Date: 03/03/2022 ?Lora Havens, MD     03/03/2022  9:19 AM Patient Name: Todd Estrada MRN: [id number] Epilepsy Attending: Lora Havens Referring Physician/Provider: Donnetta Simpers, MD Date: 03/03/2022 Duration: 24.42 mins Patient history: 61 y.o. 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 EEG to evaluate for seizure Level of alertness: lethargic AEDs during EEG study: LEV, Ativan Technical aspects: This EEG study was done with scalp electrodes  positioned according to the 10-20 International system of electrode placement. Electrical activity was acquired at a sampling rate of 500Hz  and reviewed with a high frequency filter of 70Hz  and a low frequency

## 2022-03-03 NOTE — Transfer of Care (Signed)
Immediate Anesthesia Transfer of Care Note ? ?Patient: Todd Estrada ? ?Procedure(s) Performed: IR WITH ANESTHESIA ? ?Patient Location: ICU ? ?Anesthesia Type:General ? ?Level of Consciousness: pateint uncooperative ? ?Airway & Oxygen Therapy: Patient Spontanous Breathing and Patient connected to face mask oxygen ? ?Post-op Assessment: Report given to RN and Post -op Vital signs reviewed and stable ? ?Post vital signs: Reviewed and stable ? ?Last Vitals:  ?Vitals Value Taken Time  ?BP 116/65 03/03/22 0032  ?Temp    ?Pulse 79 03/03/22 0037  ?Resp 14 03/03/22 0037  ?SpO2 98 % 03/03/22 0037  ?Vitals shown include unvalidated device data. ? ?Last Pain:  ?Vitals:  ? 03/03/22 0005  ?TempSrc: Axillary  ?   ? ?  ? ?Complications: No notable events documented. ?

## 2022-03-03 NOTE — Progress Notes (Signed)
PT Cancellation Note ? ?Patient Details ?Name: Todd Estrada ?MRN: [id number] ?DOB: 1961-07-26 ? ? ?Cancelled Treatment:    Reason Eval/Treat Not Completed: Medical issues which prohibited therapy at this time. RN states the pt is about to be intubated at this time. PT will sign off and await new orders once medically appropriate.  ? ?[place], PT, DPT  ? ?Acute Rehabilitation Department ?Pager #: [phone] - [phone] ? ? ? ?Sandra Cockayne ?03/03/2022, 10:38 AM ?

## 2022-03-03 NOTE — Progress Notes (Signed)
? ? ?Referring Physician(s): Code Stroke ? ?Supervising Physician: Corrie Mckusick ? ?Patient Status:  [facility] - In-pt ? ?Chief Complaint: Left ICA stroke s/p thrombectomy and balloon angioplasty with Dr. Estanislado Pandy 03/02/22 ? ?Subjective: Patient recently intubated for altered mental status and respiratory failure.  ? ?Allergies: ?Patient has no known allergies. ? ?Medications: ?Prior to Admission medications   ?Medication Sig Start Date End Date Taking? Authorizing Provider  ?aspirin EC 81 MG EC tablet Take 1 tablet (81 mg total) by mouth daily. Swallow whole. 08/21/21   Kathie Dike, MD  ?atorvastatin (LIPITOR) 80 MG tablet Take 1 tablet (80 mg total) by mouth daily. 08/21/21   Kathie Dike, MD  ?metoprolol tartrate (LOPRESSOR) 25 MG tablet Take 0.5 tablets (12.5 mg total) by mouth 2 (two) times daily. 08/21/21   Kathie Dike, MD  ? ? ? ?Vital Signs: ?BP 108/61   Pulse 70   Temp 98.1 ?F (36.7 ?C) (Axillary)   Resp (!) 21   Ht 6' (1.829 m)   Wt 186 lb 11.7 oz (84.7 kg)   SpO2 92%   BMI 25.33 kg/m?  ? ?Physical Exam ?Constitutional:   ?   Comments: Intubated/sedated.   ?Cardiovascular:  ?   Rate and Rhythm: Normal rate.  ?   Comments: Right femoral vascular site is clean, soft and dry.  ?Skin: ?   General: Skin is warm and dry.  ? ? ?Imaging: ?CT HEAD WO CONTRAST ? ?Result Date: 03/02/2022 ?CLINICAL DATA:  Seizure like activity, postictal, history of alcohol abuse EXAM: CT HEAD WITHOUT CONTRAST TECHNIQUE: Contiguous axial images were obtained from the base of the skull through the vertex without intravenous contrast. RADIATION DOSE REDUCTION: This exam was performed according to the departmental dose-optimization program which includes automated exposure control, adjustment of the mA and/or kV according to patient size and/or use of iterative reconstruction technique. COMPARISON:  08/20/2021 FINDINGS: Brain: Chronic left MCA territory infarct again identified, with interval dystrophic calcification in the infarct  bed since prior study. Chronic ischemic changes are again identified throughout the periventricular and subcortical white matter, left basal ganglia, and left cerebellar hemisphere. No evidence of acute infarct or hemorrhage. Lateral ventricles and remaining midline structures are unremarkable. No acute extra-axial fluid collections. No mass effect. Vascular: No hyperdense vessel or unexpected calcification. Skull: Normal. Negative for fracture or focal lesion. Sinuses/Orbits: Chronic opacification right maxillary sinus. Stable mucosal thickening left maxillary sinus. Other: None. IMPRESSION: 1. Chronic ischemic changes as above. No acute intracranial process. Electronically Signed   By: Randa Ngo M.D.   On: 03/02/2022 19:57  ? ?CT CERVICAL SPINE WO CONTRAST ? ?Result Date: 03/02/2022 ?CLINICAL DATA:  Seizure like activity, history of alcohol abuse, neck trauma EXAM: CT CERVICAL SPINE WITHOUT CONTRAST TECHNIQUE: Multidetector CT imaging of the cervical spine was performed without intravenous contrast. Multiplanar CT image reconstructions were also generated. RADIATION DOSE REDUCTION: This exam was performed according to the departmental dose-optimization program which includes automated exposure control, adjustment of the mA and/or kV according to patient size and/or use of iterative reconstruction technique. COMPARISON:  None. FINDINGS: Alignment: Alignment is anatomic. Skull base and vertebrae: No acute fracture. No primary bone lesion or focal pathologic process. Soft tissues and spinal canal: No prevertebral fluid or swelling. No visible canal hematoma. Disc levels: Multilevel cervical spondylosis, with prominent anterior osteophyte formation from C4-5 through C6-7. Circumferential disc osteophyte complex at C3-4 and C6-7 results in right greater than left neural foraminal narrowing. Multilevel facet hypertrophy greatest from C4 through C6. Upper chest:  Central airways patent.  Severe upper lobe emphysema.  Other: Reconstructed images demonstrate no additional findings. IMPRESSION: 1. No acute cervical spine fracture. 2. Multilevel cervical spondylosis and facet hypertrophy as above. 3. Emphysema. Electronically Signed   By: Randa Ngo M.D.   On: 03/02/2022 20:00  ? ?DG CHEST PORT 1 VIEW ? ?Result Date: 03/03/2022 ?CLINICAL DATA:  ETT and OG tube placement EXAM: PORTABLE CHEST 1 VIEW COMPARISON:  Radiograph 03/02/2022 FINDINGS: Endotracheal tube overlies the midthoracic trachea. Orogastric tube tip and side port overlie the stomach. Obscured cardiomediastinal silhouette on the left. The patient is rotated but there appears to be leftward mediastinal shift. There is a small to moderate size left pleural effusion. There is left mid to lower lung airspace consolidation. Elevated left hemidiaphragm. There is a nodular opacity overlying the right peripheral mid lung which measures 1.7 cm. IMPRESSION: Small to moderate size left pleural effusion with increased left mid to lower lung opacities and persistent right peripheral mid lung patchy opacity. Findings are concerning for pneumonia/aspiration. The right peripheral opacity may or may not be related to the new process in the left mid to lower lung. Recommend continued radiographic follow-up, this may require chest CT for further evaluation. Electronically Signed   By: Maurine Simmering M.D.   On: 03/03/2022 11:44  ? ?DG Chest Portable 1 View ? ?Result Date: 03/02/2022 ?CLINICAL DATA:  Seizure like activity.  Cough. EXAM: PORTABLE CHEST 1 VIEW COMPARISON:  None. FINDINGS: Heart size is normal. Artifact overlies the chest. Patient probably has chronic lung disease with possible emphysema and chronic lung markings. Few areas of patchy density in the mid and lower lungs could possibly represent mild pneumonia. I would suggest a two-view chest radiography exam be performed when able, particularly with respect to a possible nodular shadow in the right mid lung which could be better  evaluated at that time. No effusion. No acute bone finding. IMPRESSION: Probable chronic lung disease. Emphysema and pulmonary scarring suspected. Question few scattered patchy densities in the mid and lower lungs that could possibly be inflammatory, but two-view chest radiography is recommended when able to assess for the possibility of pulmonary nodules. Electronically Signed   By: Nelson Chimes M.D.   On: 03/02/2022 18:38  ? ?EEG adult ? ?Result Date: 03/03/2022 ?Lora Havens, MD     03/03/2022  9:19 AM Patient Name: Todd Estrada MRN: [id number] Epilepsy Attending: Lora Havens Referring Physician/Provider: Donnetta Simpers, MD Date: 03/03/2022 Duration: 24.42 mins Patient history: 61 y.o. 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 EEG to evaluate for seizure Level of alertness: lethargic AEDs during EEG study: LEV, Ativan Technical aspects: This EEG study was done with scalp electrodes positioned according to the 10-20 International system of electrode placement. Electrical activity was acquired at a sampling rate of 500Hz  and reviewed with a high frequency filter of 70Hz  and a low frequency filter of 1Hz . EEG data were recorded continuously and digitally stored. Description: EEG showed continuous low amplitude 3 to 5 Hz theta-delta slowing in left hemisphere. Additionally there was 8 to 10 Hz alpha activity in right hemisphere. Hyperventilation and photic stimulation were not performed.   ABNORMALITY - Continuous slow, left hemisphere IMPRESSION: This  study is suggestive of cortical dysfunction in left hemisphere likely secondary to underlying stroke. No seizures or definite epileptiform  discharges were seen throughout the recording. Priyanka Barbra Sarks  ? ?CT ANGIO HEAD NECK W WO CM W PERF (CODE STROKE) ? ?Result Date: 03/02/2022 ?CLINICAL DATA:  Stroke suspected, seizure like activity, prior left MCA

## 2022-03-03 NOTE — Procedures (Signed)
Patient Name: Todd Estrada  ?MRN: [id number]  ?Epilepsy Attending: Lora Havens  ?Referring Physician/Provider: Donnetta Simpers, MD ?Date: 03/03/2022  ?Duration: 24.42 mins ? ?Patient history: 61 y.o. 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 EEG to evaluate for seizure ? ?Level of alertness: lethargic  ? ?AEDs during EEG study: LEV, Ativan ? ?Technical aspects: This EEG study was done with scalp electrodes positioned according to the 10-20 International system of electrode placement. Electrical activity was acquired at a sampling rate of 500Hz  and reviewed with a high frequency filter of 70Hz  and a low frequency filter of 1Hz . EEG data were recorded continuously and digitally stored.  ? ?Description: EEG showed continuous low amplitude 3 to 5 Hz theta-delta slowing in left hemisphere. Additionally there was 8 to 10 Hz alpha activity in right hemisphere. Hyperventilation and photic stimulation were not performed.    ? ?ABNORMALITY ?- Continuous slow, left hemisphere ? ?IMPRESSION: ?This study is suggestive of cortical dysfunction in left hemisphere likely secondary to underlying stroke. No seizures or definite epileptiform discharges were seen throughout the recording. ? ?Lora Havens  ? ?

## 2022-03-03 NOTE — Progress Notes (Signed)
During the patient's bath at 1300 the patient became agitated. The patient was given a 50 mcg fentanyl push and he was already on a precedex drip at 0.8 mcg/kg/hr. His respiratory status began declining rapidly with ventilator support. CCM and respiratory were called to the bedside by this RN. The patient's blood pressure began to decrease rapidly. CCM ordered a levo drip, sodium bicarbonate push, calcium gluconate push, and an epinephrine push (see MAR). The patient's blood pressure rose significantly fast and the patient soon became more stable. A bronchoscopy was then performed by CCM at the bedside.  ? ?The patient is stable at this time. This RN will continue to monitor the patient's status closely throughout the duration of my shift.  ? ?Normajean Baxter, RN ?

## 2022-03-03 NOTE — Sedation Documentation (Signed)
Patient transported to [facility] ICU with CRNA staff. ICU staff at the bedside to receive the patient. Patient is not following basic commands at this time. Tremors noted throughout. Right groin puncture site intact. No drainage noted from dressing. Soft to palpation, no hematoma noted. Doppler distal pulses intact DP/PT.  ?

## 2022-03-03 NOTE — Progress Notes (Signed)
Attempted NG insertion with additional RN at bedside. Unable to insert NG into either nostril. Both became very bloody and irritated.  ? ?Normajean Baxter, RN ?

## 2022-03-03 NOTE — Consult Note (Signed)
? ?NAME:  Todd Estrada, MRN:  [id number], DOB:  11/09/1961, LOS: 1 ?ADMISSION DATE:  03/02/2022, CONSULTATION DATE:  03/03/2021 ?REFERRING MD:  Erlinda Hong, CHIEF COMPLAINT:  AMS, seizure  ? ?History of Present Illness:  ?Patient is a 61 year old male with past medical history of hypertension, left MCA stroke in 2130 complicated by petechial hemorrhage with ongoing residual deficits presenting with witnessed seizure-like activity with ongoing dysarthria and aphasia.  Patient was loaded with Keppra in the ED.  This was initially thought to be in setting of postictal state; however, with ongoing symptoms, neurology consulted and code stroke activated in the ED.  CT head negative for any acute intracranial process however, CTA notable for occlusion of left ICA.  Patient underwent thrombectomy and stenting of left ICA and was subsequently transferred to neurological ICU.  This morning, patient with continued altered mental status and unresponsiveness.  PCCM consulted for concerns of inability protect airway. ? ?Patient is altered and unable to contact family at this time. History obtained by chart review.  ? ?Pertinent  Medical History  ? ?Past Medical History:  ?Diagnosis Date  ? 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 ?Significant Hospital Events: ?Including procedures, antibiotic start and stop dates in addition to other pertinent events   ?3/15 admitted for acute L MCA stroke due to L ICA occlusion with first time seizure s/p revascularization of L ICA occlusion ? ?Interim History / Subjective:  ?Patient is unresponsive this morning with concerns for inability to protect airway, PCCM consulted.  Intubated. ?Objective   ?Blood pressure (!) 143/82, pulse 89, temperature 97.7 ?F (36.5 ?C), temperature source Axillary, resp. rate (!) 29, height 6' (1.829 m), weight 84.7 kg, SpO2 100 %. ?   ?   ? ?Intake/Output Summary (Last 24 hours) at 03/03/2022 1023 ?Last data filed at 03/03/2022  1000 ?Gross per 24 hour  ?Intake 2924.17 ml  ?Output 3450 ml  ?Net -525.83 ml  ? ?Filed Weights  ? 03/02/22 2000 03/03/22 0339 03/03/22 1000  ?Weight: 87.9 kg 84.7 kg 84.7 kg  ? ? ?Examination: ?General: Chronically ill-appearing and unkempt elderly male, unresponsive ?HENT: [place]/AT, PEERL, edentulous ?Lungs: bilat rhoncherous breath sounds; on NRB ?Cardiovascular: RRR, S1 and S2 present, distal pulses intact ?Abdomen: soft, nondistended, +BS ?Extremities: normal bulk and tone; no peripheral edema ?Neuro: obtunded; unresponsive to painful stimuli; PERRL, no apparent facial droop ?Skin: Warm and dry; no rashes or lesions noted ? ?Resolved Hospital Problem list   ? ?Assessment & Plan:  ?Acute encephalopathy 2/2 L MCA stroke 2/2 ICA occlusion s/p thrombectomy and stenting ?Seizure activity ?Patient presented with witnessed seizure activity with CTA Head/Neck significant for left ICA occlusion and diminished perfusion in left MCA. Underwent thrombectomy and stenting of L ICA with IR. However, mental status unimproved.  ?- Neurology following ?- Frequent neuro checks ?- F/u MRI Brain and MRA head  ?- Continue Keppra bid ?- Cardiac monitoring ?- PT/OT/SLP once mental status improves  ?- Seizure precautions  ? ?Acute respiratory insufficiency 2/2 inability to protect airway ?Patient unable to protect airway in setting of acute encephalopathy as above. Unresponsive to painful stimuli and absent gag reflex.  ?- Intubated for airway protection ?- Full vent support ?- VAP bundle ? ?Concern for ETOH use ?- CIWA protocol ?- Folate and thiamine supplement ? ?Hypertension ?- Permissive hypertension per neuro ? ?Hx of CHF ?Hx of combined systolic and diastolic dysfunction with prior Echo in 08/2021 with EF  40-45% without valvular abnormalities. Does not appear to be in volume overload at this time ?- F/u repeat Echo ?- Continue to monitor  ? ?Best Practice (right click and "Reselect all SmartList Selections" daily)  ? ?Diet/type:  NPO ?DVT prophylaxis: LMWH ?GI prophylaxis: N/A ?Lines: N/A ?Foley:  N/A ?Code Status:  full code ?Last date of multidisciplinary goals of care discussion [3/16 discussed with neurology; unable to contact listed family at this time despite multiple attempts] ? ?Labs   ?CBC: ?Recent Labs  ?Lab 03/02/22 ?1813 03/02/22 ?1822 03/03/22 ?0101 03/03/22 ?0430  ?WBC 9.0  --  8.2 9.7  ?NEUTROABS 7.0  --   --  6.9  ?HGB 17.3* 17.7* 14.4 15.2  ?HCT 52.2* 52.0 44.6 45.5  ?MCV 92.7  --  93.9 91.5  ?PLT 195  --  158 166  ? ? ?Basic Metabolic Panel: ?Recent Labs  ?Lab 03/02/22 ?1813 03/02/22 ?1822 03/03/22 ?0101 03/03/22 ?0430  ?NA 135 138  --  136  ?K 4.5 4.6  --  3.5  ?CL 101 102  --  101  ?CO2 22  --   --  26  ?GLUCOSE 97 99  --  103*  ?BUN 11 13  --  6*  ?CREATININE 0.99 0.90 0.74 0.75  ?CALCIUM 8.9  --   --  8.5*  ? ?GFR: ?Estimated Creatinine Clearance: 106.4 mL/min (by C-G formula based on SCr of 0.75 mg/dL). ?Recent Labs  ?Lab 03/02/22 ?1813 03/03/22 ?0101 03/03/22 ?0430  ?WBC 9.0 8.2 9.7  ? ? ?Liver Function Tests: ?Recent Labs  ?Lab 03/02/22 ?1813  ?AST 31  ?ALT 22  ?ALKPHOS 58  ?BILITOT 1.4*  ?PROT 6.3*  ?ALBUMIN 3.6  ? ?No results for input(s): LIPASE, AMYLASE in the last 168 hours. ?No results for input(s): AMMONIA in the last 168 hours. ? ?ABG ?   ?Component Value Date/Time  ? TCO2 27 03/02/2022 1822  ?  ? ?Coagulation Profile: ?No results for input(s): INR, PROTIME in the last 168 hours. ? ?Cardiac Enzymes: ?No results for input(s): CKTOTAL, CKMB, CKMBINDEX, TROPONINI in the last 168 hours. ? ?HbA1C: ?Hgb A1c MFr Bld  ?Date/Time Value Ref Range Status  ?08/19/2021 07:49 AM 5.5 4.8 - 5.6 % Final  ?  Comment:  ?  (NOTE) ?Pre diabetes:          5.7%-6.4% ? ?Diabetes:              >6.4% ? ?Glycemic control for   <7.0% ?adults with diabetes ?  ? ? ?CBG: ?Recent Labs  ?Lab 03/02/22 ?1845  ?GLUCAP 121*  ? ? ?Review of Systems:   ?Unable to obtain 2/2 mental status  ? ?Past Medical History:  ?He,  has a past medical history  of Hypertension.  ? ?Surgical History:  ?History reviewed. No pertinent surgical history.  ? ?Social History:  ? reports that he has been smoking. He has never used smokeless tobacco. He reports current alcohol use. He reports that he does not use drugs.  ? ?Family History:  ?His family history is not on file.  ? ?Allergies ?No Known Allergies  ? ?Home Medications  ?Prior to Admission medications   ?Medication Sig Start Date End Date Taking? Authorizing Provider  ?aspirin EC 81 MG EC tablet Take 1 tablet (81 mg total) by mouth daily. Swallow whole. 08/21/21   Kathie Dike, MD  ?atorvastatin (LIPITOR) 80 MG tablet Take 1 tablet (80 mg total) by mouth daily. 08/21/21   Kathie Dike, MD  ?metoprolol tartrate (LOPRESSOR) 25  MG tablet Take 0.5 tablets (12.5 mg total) by mouth 2 (two) times daily. 08/21/21   Kathie Dike, MD  ?  ? ?Critical care time: 45 minutes ?  ? ?Harvie Heck, MD ?Internal Medicine, PGY-3 ?03/03/22 10:23 AM ?Pager # [phone] ? ? ? ? ? ?

## 2022-03-03 NOTE — Progress Notes (Signed)
EEG complete - results pending 

## 2022-03-03 NOTE — Procedures (Signed)
Intubation Procedure Note ? ?DARCY CORDNER  ?[id number]  ?Aug 24, 1961 ? ?Date:03/03/22  ?Time:10:51 AM  ? ?Provider Performing:Dawnn Nam  ? ? ?Procedure: Intubation (56943) ? ?Indication(s) ?Respiratory Failure ? ?Consent ?Unable to obtain consent due to inability to find a medical decision maker for patient.  All reasonable efforts were made.  Another independent medical provider, Dr Jacky Kindle , confirmed the benefits of this procedure outweigh the risks. ? ? ?Anesthesia ?Etomidate and Rocuronium ? ? ?Time Out ?Verified patient identification, verified procedure, site/side was marked, verified correct patient position, special equipment/implants available, medications/allergies/relevant history reviewed, required imaging and test results available. ? ? ?Sterile Technique ?Usual hand hygeine, masks, and gloves were used ? ? ?Procedure Description ?Patient positioned in bed supine.  Sedation given as noted above.  Patient was intubated with endotracheal tube using Glidescope.  View was Grade 1 full glottis .  Number of attempts was 1.  Colorimetric CO2 detector was consistent with tracheal placement. ? ? ?Complications/Tolerance ?None; patient tolerated the procedure well. ?Chest X-ray is ordered to verify placement. ? ? ?EBL ?Minimal ? ? ?Specimen(s) ?None ? ?

## 2022-03-03 NOTE — Progress Notes (Signed)
Pharmacy Antibiotic Note ? ?Todd Estrada is a 61 y.o. male admitted on 03/02/2022 with aspiration pneumonia.  Pharmacy has been consulted for Unasyn dosing. SCr stable at 0.75. ? ?Plan: ?Unasyn 3g IV q6h ?Monitor clinical progress, c/s, renal function ?F/u de-escalation plan/LOT ? ? ?Height: 6' (182.9 cm) ?Weight: 84.7 kg (186 lb 11.7 oz) ?IBW/kg (Calculated) : 77.6 ? ?Temp (24hrs), Avg:97.7 ?F (36.5 ?C), Min:96.9 ?F (36.1 ?C), Max:98.1 ?F (36.7 ?C) ? ?Recent Labs  ?Lab 03/02/22 ?1813 03/02/22 ?1822 03/03/22 ?0101 03/03/22 ?0430  ?WBC 9.0  --  8.2 9.7  ?CREATININE 0.99 0.90 0.74 0.75  ?  ?Estimated Creatinine Clearance: 106.4 mL/min (by C-G formula based on SCr of 0.75 mg/dL).   ? ?No Known Allergies ? ?Arturo Morton, PharmD, BCPS ?Please check AMION for all [facility] contact numbers ?Clinical Pharmacist ?03/03/2022 12:17 PM ? ? ?

## 2022-03-03 NOTE — Procedures (Addendum)
Bronchoscopy Procedure Note ? ?Todd Estrada  ?[id number]  ?14-May-1961 ? ?Date:03/03/22  ?Time:2:12 PM  ? ?Provider Performing:Hanah Moultry  ? ?Procedure(s):  Flexible bronchoscopy with bronchial alveolar lavage (27253) and Initial Therapeutic Aspiration of Tracheobronchial Tree (66440) ? ?Indication(s) ?Increased secretions and vent dyssynchrony ? ?Consent ?Unable to obtain consent due to inability to find a medical decision maker for patient.  All reasonable efforts were made.  Another independent medical provider, Dr Jacky Kindle , confirmed the benefits of this procedure outweigh the risks. ? ?Anesthesia ?Rocuronium ? ? ?Time Out ?Verified patient identification, verified procedure, site/side was marked, verified correct patient position, special equipment/implants available, medications/allergies/relevant history reviewed, required imaging and test results available. ? ? ?Sterile Technique ?Usual hand hygiene, masks, gowns, and gloves were used ? ? ?Procedure Description ?Bronchoscope advanced through endotracheal tube and into airway.  Airways were examined down to subsegmental level with findings noted below.   ?Following diagnostic evaluation, BAL(s) performed in left lung with normal saline and return of 30cc fluid ? ?Findings: Copious secretions noted in bilateral lungs.  ? ? ?Complications/Tolerance ?None; patient tolerated the procedure well. ?Chest X-ray is needed post procedure. ? ? ?EBL ?Minimal ? ? ?Specimen(s) ?BAL ? ?

## 2022-03-03 NOTE — Progress Notes (Signed)
I was called to patient room because he was hypotensive with systolic blood pressure in 50s, he was uncomfortable on the vent but remained unresponsive even with noxious stimuli. ?Bedside echocardiogram was done which showed normal left ventricular ejection fraction and normal RV. ?Ringer lactate 1 L IV fluid was started, patient was started on IV Levophed, dose was escalated to 15, despite that patient remained hypotensive ?He was given 2 A of bicarbonate and 1 amp of calcium chloride as patient blood pressure was not improving, he was given half amp of epi with that his systolic blood pressure increased to 300s, then slowly came down now map is 70, vent tubings were changed because he had a lot of secretions that led to clogging tubes and rising peak pressures. ? ?Diagnostic and therapeutic bronchoscopy was performed, please see separate procedure note ? ?At this time patient blood pressure is 123/75, heart rate is 70, his O2 sat is 100% on mechanical ventilator. ? ?He is off vasopressors ?X-ray chest is ordered ? ?Additional critical care time: 36 minutes ? ?Performed by: Jacky Kindle ?  ?Critical care time was exclusive of separately billable procedures and treating other patients. ?  ?Critical care was necessary to treat or prevent imminent or life-threatening deterioration. ?  ?Critical care was time spent personally by me on the following activities: development of treatment plan with patient and/or surrogate as well as nursing, discussions with consultants, evaluation of patient's response to treatment, examination of patient, obtaining history from patient or surrogate, ordering and performing treatments and interventions, ordering and review of laboratory studies, ordering and review of radiographic studies, pulse oximetry and re-evaluation of patient's condition. ?  ?Jacky Kindle MD ?[facility] ?See Amion for pager ?If no response to pager, please call [phone] until 7pm ?After 7pm,  Please call E-link [phone] ? ?

## 2022-03-03 NOTE — Progress Notes (Signed)
?  Transition of Care (TOC) Screening Note ? ? ?Patient Details  ?Name: Todd Estrada ?Date of Birth: Aug 28, 1961 ? ? ?Transition of Care [facility]) CM/SW Contact:    ?Ella Bodo, RN ?Phone Number: ?03/03/2022, 2:31 PM ? ? ? ?Transition of Care Department [facility]) has reviewed patient and no TOC needs have been identified at this time. We will continue to monitor patient advancement through interdisciplinary progression rounds. If new patient transition needs arise, please place a TOC consult. ? ?Reinaldo Raddle, RN, BSN  ?Trauma/Neuro ICU Case Manager ?[phone] ? ?

## 2022-03-03 NOTE — Progress Notes (Signed)
OT Cancellation Note ? ?Patient Details ?Name: Todd Estrada ?MRN: [id number] ?DOB: December 17, 1961 ? ? ?Cancelled Treatment:    Reason Eval/Treat Not Completed: Medical issues which prohibited therapy.  Patient with change in medical status and intubated this am.  OT to await new orders.   ? ?Oluwasemilore Bahl D Taeshawn Helfman ?03/03/2022, 1:43 PM ?

## 2022-03-03 NOTE — Anesthesia Postprocedure Evaluation (Signed)
Anesthesia Post Note ? ?Patient: Todd Estrada ? ?Procedure(s) Performed: IR WITH ANESTHESIA ? ?  ? ?Patient location during evaluation: PACU ?Anesthesia Type: General ?Level of consciousness: awake ?Pain management: pain level controlled ?Vital Signs Assessment: post-procedure vital signs reviewed and stable ?Respiratory status: spontaneous breathing, nonlabored ventilation, respiratory function stable and patient connected to nasal cannula oxygen ?Cardiovascular status: blood pressure returned to baseline and stable ?Postop Assessment: no apparent nausea or vomiting ?Anesthetic complications: no ? ? ?No notable events documented. ? ?Last Vitals:  ?Vitals:  ? 03/03/22 0600 03/03/22 0630  ?BP: 116/62 (!) 94/58  ?Pulse: 82 82  ?Resp: 20 (!) 22  ?Temp:    ?SpO2: 95% 94%  ?  ?Last Pain:  ?Vitals:  ? 03/03/22 0400  ?TempSrc: Axillary  ? ? ?  ?  ?  ?  ?  ?  ? ?Winner Valeriano,W. EDMOND ? ? ? ? ?

## 2022-03-03 NOTE — Progress Notes (Signed)
Initial Nutrition Assessment ? ?DOCUMENTATION CODES:  ? ?Non-severe (moderate) malnutrition in context of chronic illness ? ?INTERVENTION:  ? ?Initiate tube feeding via OG tube : ?Osmolite 1.5 at 25 ml/h and increase by 10 ml every 8 hours to goal rate of 55 ml/hr (1320 ml per day) ?Prosource TF 90 ml TID ? ?Provides 2220 kcal, 148 gm protein, 1003 ml free water daily ? ?Monitor magnesium and phosphorus every 12 hours x 4 occurrences, MD to replete as needed, as pt is at risk for refeeding syndrome given malnutrition. ?Pt on MVI, thiamine, and folic acid ? ?NUTRITION DIAGNOSIS:  ? ?Moderate Malnutrition related to chronic illness (L MCA stroke) as evidenced by moderate fat depletion, moderate muscle depletion. ? ?GOAL:  ? ?Patient will meet greater than or equal to 90% of their needs ? ?MONITOR:  ? ?TF tolerance ? ?REASON FOR ASSESSMENT:  ? ?Consult, Ventilator ?Enteral/tube feeding initiation and management ? ?ASSESSMENT:  ? ?Pt with PMH of HTN, CHF, L MCA stroke 2022 with mild to moderate aphasia and R sided weakness now admitted with L MCA due to L ICA occlusion and seizure-like activity.  ? ?Pt discussed during ICU rounds and with RN.  ?Spoke with RN, pt intubated this am  ?No family present. Noted barrel chest, per RN pt with hx of emphysema but not listed in problem list.  ? ?Patient is currently intubated on ventilator support ?MV: 10 L/min ?Temp (24hrs), Avg:97.7 ?F (36.5 ?C), Min:96.9 ?F (36.1 ?C), Max:98.1 ?F (36.7 ?C) ? ? ?Medications reviewed and include: colace, folic acid, MVI with minerals, miralax, thiamine  ?NS @ 50 ml/hr  ? ?Labs reviewed: A1C pending  ?CBG's: 121 ? ?OG tube: tip in the stomach  ? ? ?NUTRITION - FOCUSED PHYSICAL EXAM: ? ?Flowsheet Row Most Recent Value  ?Orbital Region No depletion  ?Upper Arm Region Severe depletion  ?Thoracic and Lumbar Region Moderate depletion  ?Buccal Region Unable to assess  ?Temple Region No depletion  ?Clavicle Bone Region Severe depletion  ?Clavicle and  Acromion Bone Region Moderate depletion  ?Scapular Bone Region Unable to assess  ?Dorsal Hand No depletion  ?Patellar Region Moderate depletion  ?Anterior Thigh Region Moderate depletion  ?Posterior Calf Region Mild depletion  ?Edema (RD Assessment) None  ?Hair Reviewed  ?Eyes Unable to assess  ?Mouth Unable to assess  ?Skin Reviewed  ?Nails Reviewed  ? ?  ? ? ?Diet Order:   ?Diet Order   ? ?       ?  Diet NPO time specified  Diet effective now       ?  ? ?  ?  ? ?  ? ? ?EDUCATION NEEDS:  ? ?Not appropriate for education at this time ? ?Skin:  Skin Assessment: Skin Integrity Issues: ?Skin Integrity Issues:: Stage II ?Stage II: R buttocks ? ?Last BM:  unknown ? ?Height:  ? ?Ht Readings from Last 1 Encounters:  ?03/03/22 6' (1.829 m)  ? ? ?Weight:  ? ?Wt Readings from Last 1 Encounters:  ?03/03/22 84.7 kg  ? ? ?BMI:  Body mass index is 25.33 kg/m?. ? ?Estimated Nutritional Needs:  ? ?Kcal:  2100-2300 ? ?Protein:  135-150 grams ? ?Fluid:  > 2 L/day ? ?Lockie Pares., RD, LDN, CNSC ?See AMiON for contact information  ? ?

## 2022-03-03 NOTE — TOC CAGE-AID Note (Signed)
Transition of Care (TOC) - CAGE-AID Screening ? ? ?Patient Details  ?Name: Todd Estrada ?MRN: [id number] ?Date of Birth: [birth year] ? ?Transition of Care (TOC) CM/SW Contact:    ?Damondre Pfeifle C Tarpley-Carter, LCSWA ?Phone Number: ?03/03/2022, 1:55 PM ? ? ?Clinical Narrative: ?Pt is unable to participate in Cage Aid. ?Pt is experiencing altered mental status.  CSW will assess at a better time. ? ?Passenger transport manager, MSW, LCSW-A ?Pronouns:  She/Her/Hers ?[facility] HealthTransitions of Care ?Clinical Social Worker ?Direct Number:  [phone] ?[email or web address] ? ?CAGE-AID Screening: ?Substance Abuse Screening unable to be completed due to: : Patient unable to participate ? ?  ?  ?  ?  ?  ? ?Substance Abuse Education Offered: Yes ? ?Substance abuse interventions: Educational Materials ? ? ? ? ? ? ?

## 2022-03-03 NOTE — Progress Notes (Signed)
An USGPIV (ultrasound guided PIV) has been placed for short-term vasopressor infusion. A correctly placed ivWatch must be used when administering Vasopressors. Should this treatment be needed beyond 72 hours, central line access should be obtained.  It will be the responsibility of the bedside nurse to follow best practice to prevent extravasations.   ?

## 2022-03-03 NOTE — Progress Notes (Signed)
SLP Cancellation Note ? ?Patient Details ?Name: Todd Estrada ?MRN: [id number] ?DOB: 11/18/1961 ? ? ?Cancelled treatment:       Reason Eval/Treat Not Completed: Medical issues which prohibited therapy (Pt has been intubated since SLP speech-language-cognition evaluation ordered. SLP will sign off at this time. Please reconsult as clinically indicated.) ? ?Tregan Read I. Hardin Negus, Duck, CCC-SLP ?Acute Rehabilitation Services ?Office number [phone] ?Pager [phone] ? ?[name] ?03/03/2022, 2:37 PM ?

## 2022-03-03 NOTE — Progress Notes (Signed)
?  Echocardiogram ?2D Echocardiogram has been performed. ? ?Oneal Deputy Yatzil Clippinger ?03/03/2022, 10:38 AM ?

## 2022-03-04 ENCOUNTER — Inpatient Hospital Stay (HOSPITAL_COMMUNITY): Payer: Medicaid Other

## 2022-03-04 ENCOUNTER — Encounter (HOSPITAL_COMMUNITY): Payer: Self-pay | Admitting: Interventional Radiology

## 2022-03-04 DIAGNOSIS — I6522 Occlusion and stenosis of left carotid artery: Secondary | ICD-10-CM | POA: Diagnosis not present

## 2022-03-04 DIAGNOSIS — Z8673 Personal history of transient ischemic attack (TIA), and cerebral infarction without residual deficits: Secondary | ICD-10-CM

## 2022-03-04 DIAGNOSIS — I63512 Cerebral infarction due to unspecified occlusion or stenosis of left middle cerebral artery: Secondary | ICD-10-CM | POA: Diagnosis not present

## 2022-03-04 DIAGNOSIS — R569 Unspecified convulsions: Secondary | ICD-10-CM | POA: Diagnosis not present

## 2022-03-04 DIAGNOSIS — F17200 Nicotine dependence, unspecified, uncomplicated: Secondary | ICD-10-CM

## 2022-03-04 DIAGNOSIS — Z978 Presence of other specified devices: Secondary | ICD-10-CM | POA: Diagnosis not present

## 2022-03-04 LAB — GLUCOSE, CAPILLARY
Glucose-Capillary: 165 mg/dL — ABNORMAL HIGH (ref 70–99)
Glucose-Capillary: 135 mg/dL — ABNORMAL HIGH (ref 70–99)
Glucose-Capillary: 94 mg/dL (ref 70–99)
Glucose-Capillary: 113 mg/dL — ABNORMAL HIGH (ref 70–99)
Glucose-Capillary: 110 mg/dL — ABNORMAL HIGH (ref 70–99)
Glucose-Capillary: 112 mg/dL — ABNORMAL HIGH (ref 70–99)

## 2022-03-04 LAB — HEMOGLOBIN A1C
Hgb A1c MFr Bld: 5.4 % (ref 4.8–5.6)
Mean Plasma Glucose: 108 mg/dL

## 2022-03-04 LAB — CBC
HCT: 46 % (ref 39.0–52.0)
Hemoglobin: 14.7 g/dL (ref 13.0–17.0)
MCH: 30 pg (ref 26.0–34.0)
MCHC: 32 g/dL (ref 30.0–36.0)
MCV: 93.9 fL (ref 80.0–100.0)
Platelets: 169 10*3/uL (ref 150–400)
RBC: 4.9 MIL/uL (ref 4.22–5.81)
RDW: 13.7 % (ref 11.5–15.5)
WBC: 11.1 10*3/uL — ABNORMAL HIGH (ref 4.0–10.5)
nRBC: 0 % (ref 0.0–0.2)

## 2022-03-04 LAB — BASIC METABOLIC PANEL
Anion gap: 9 (ref 5–15)
BUN: 8 mg/dL (ref 8–23)
CO2: 29 mmol/L (ref 22–32)
Calcium: 8.6 mg/dL — ABNORMAL LOW (ref 8.9–10.3)
Chloride: 102 mmol/L (ref 98–111)
Creatinine, Ser: 0.74 mg/dL (ref 0.61–1.24)
GFR, Estimated: >60 mL/min (ref >60)
Glucose, Bld: 110 mg/dL — ABNORMAL HIGH (ref 70–99)
Potassium: 4.3 mmol/L (ref 3.5–5.1)
Sodium: 140 mmol/L (ref 135–145)

## 2022-03-04 LAB — PHOSPHORUS
Phosphorus: 4.3 mg/dL (ref 2.5–4.6)
Phosphorus: 3.9 mg/dL (ref 2.5–4.6)

## 2022-03-04 LAB — MAGNESIUM
Magnesium: 2.6 mg/dL — ABNORMAL HIGH (ref 1.7–2.4)
Magnesium: 2.3 mg/dL (ref 1.7–2.4)

## 2022-03-04 IMAGING — MR MR HEAD W/O CM
6 of 12 series · 20 of 48 positions shown · non-contrast
Comparison: No pertinent prior exam.
COMPARISON: No pertinent prior exam.

Addendum:
CLINICAL DATA: Neuro deficit, acute, stroke suspected; Stroke,
follow up

EXAM:
MRI HEAD WITHOUT CONTRAST
MRA HEAD WITHOUT CONTRAST
TECHNIQUE: Multiplanar, multi-echo pulse sequences of the brain and surrounding
structures were acquired without intravenous contrast. Angiographic
images of the Circle of Willis were acquired using MRA technique
without intravenous contrast.

[Series 2: DWI · axial · 3.0mm · 0.94mm/px · z∈[-84,+77]mm · 7 of 109 slices shown (1 of 2)]
[im 1/109]
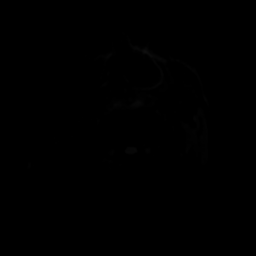
[im 19/109]
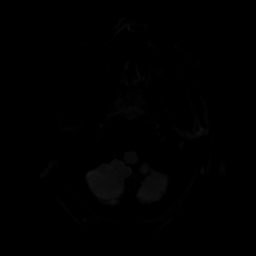
[im 37/109]
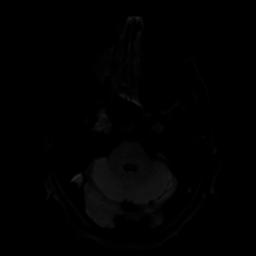
[im 55/109]
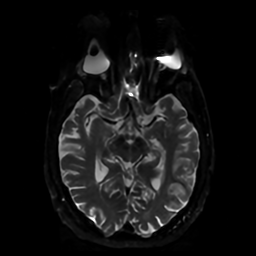
[im 73/109]
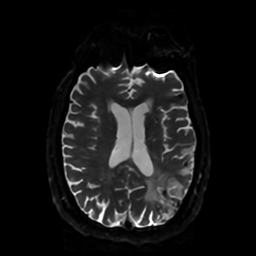
[im 91/109]
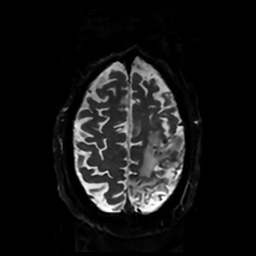
[im 109/109]
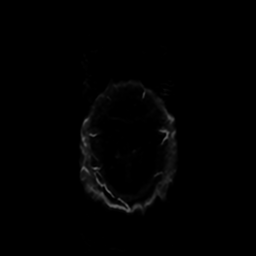

[Series 4: DWI · coronal · 4.0mm · 0.94mm/px · 4 of 73 slices shown (2 of 2)]
[im 1/73]
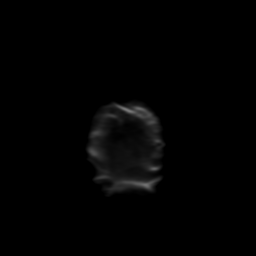
[im 25/73]
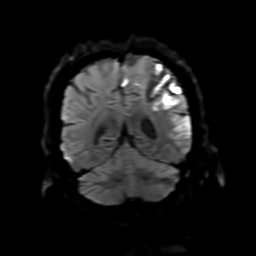
[im 49/73]
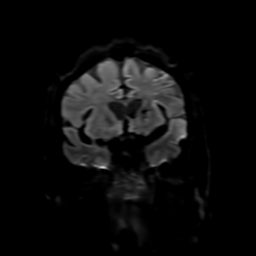
[im 73/73]
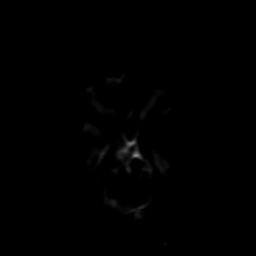

[Series 5: FLAIR · sagittal · 5.0mm · 0.23mm/px · 2 of 26 slices shown (1 of 2)]
[im 1/26]
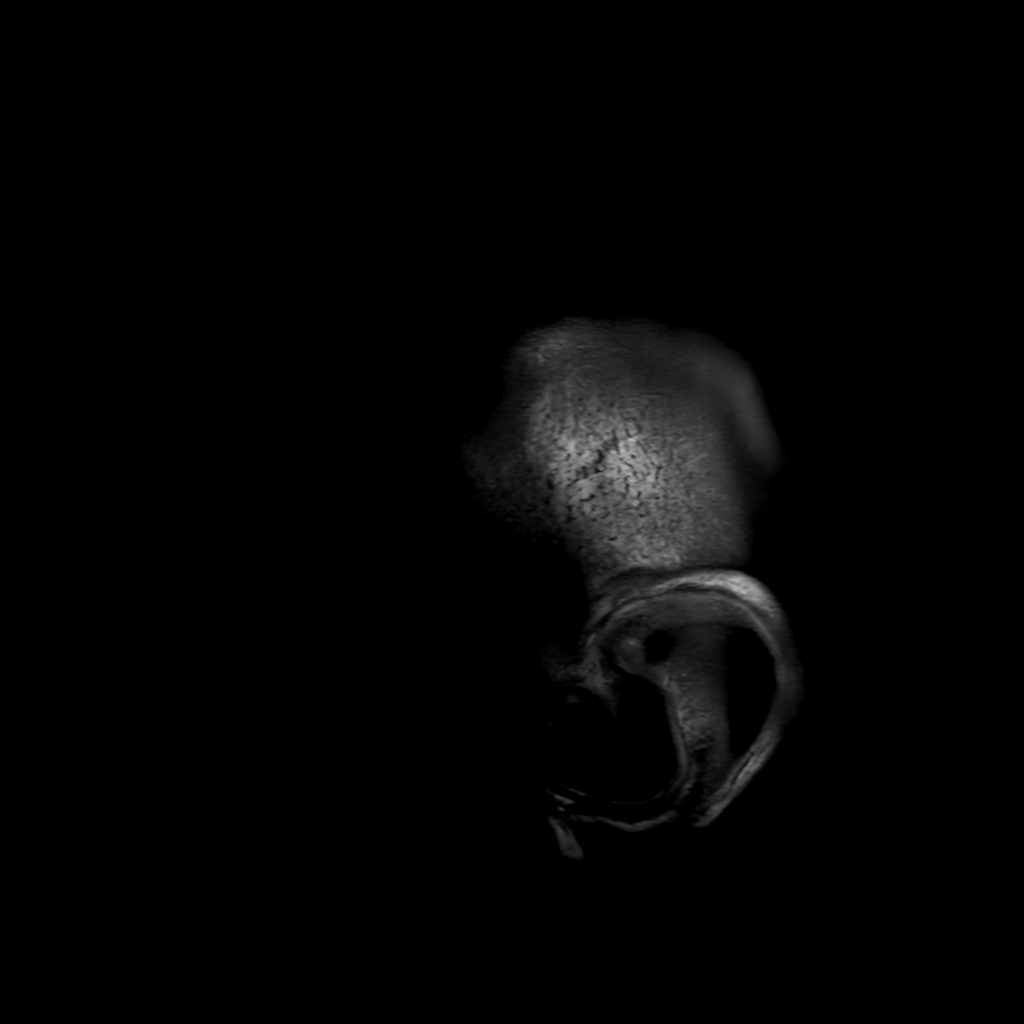
[im 26/26]
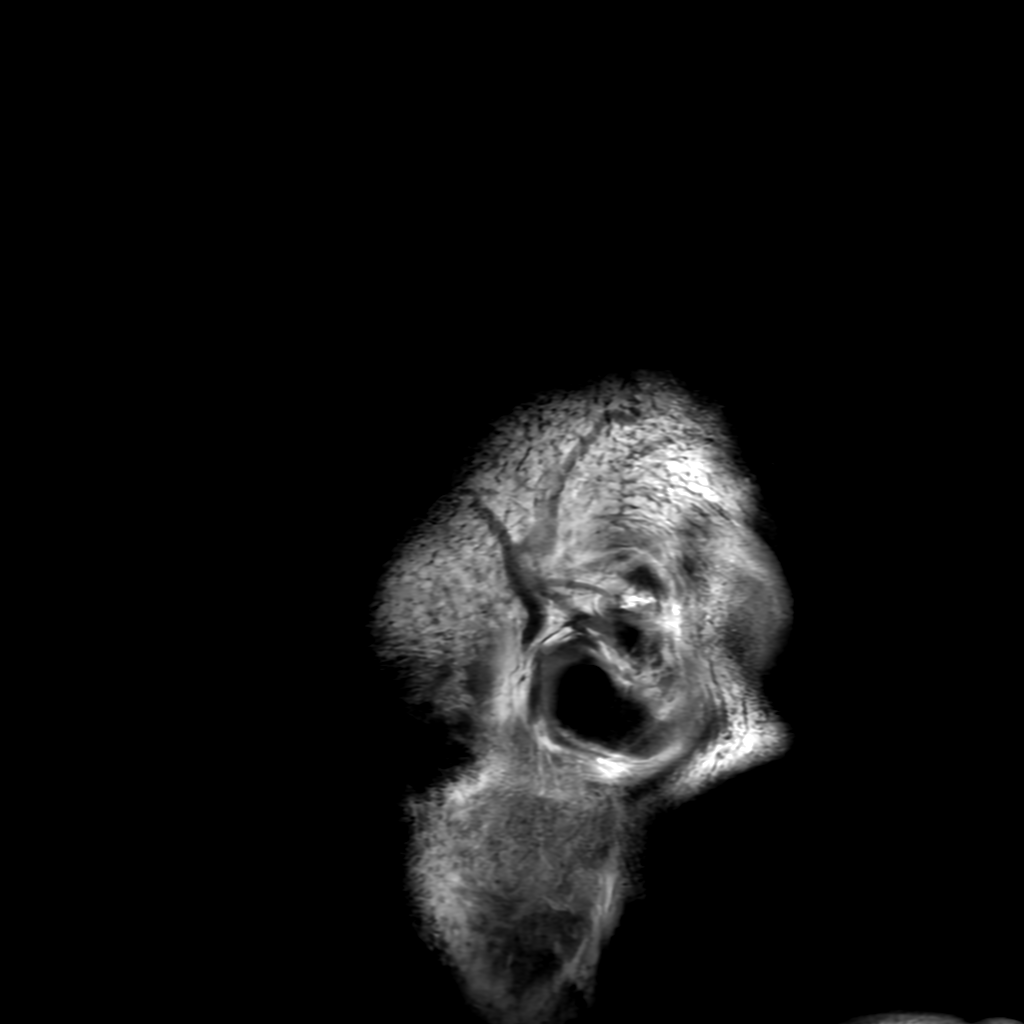

[Series 7: FLAIR · axial · 4.0mm · 0.45mm/px · z∈[-75,+77]mm · 2 of 36 slices shown (2 of 2)]
[im 1/36]
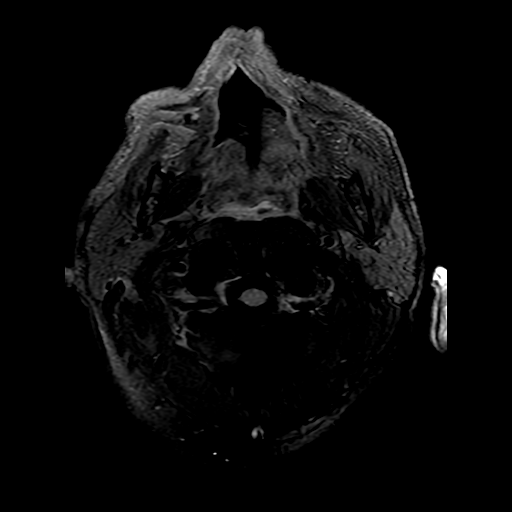
[im 36/36]
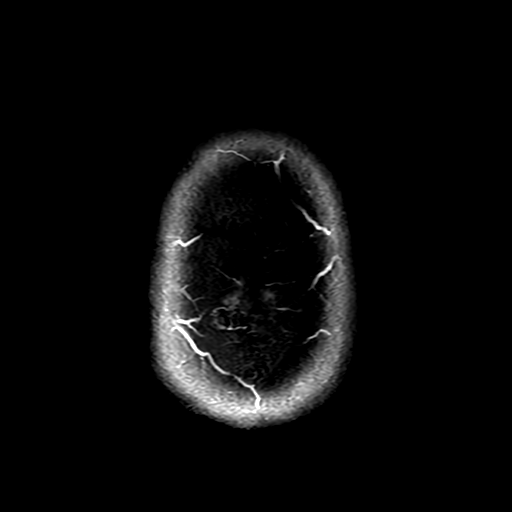

[Series 250: ADC · axial · 3.0mm · 0.94mm/px · z∈[-84,+77]mm · 3 of 55 slices shown (1 of 2)]
[im 1/55]
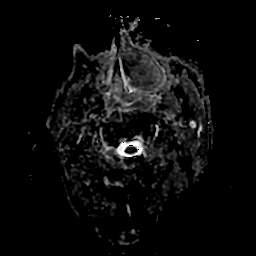
[im 28/55]
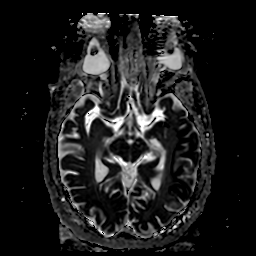
[im 55/55]
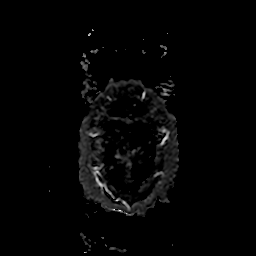

[Series 450: ADC · coronal · 4.0mm · 0.94mm/px · 2 of 37 slices shown (2 of 2)]
[im 1/37]
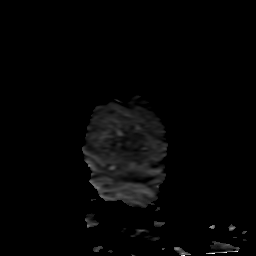
[im 37/37]
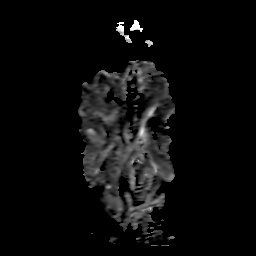

[20 of 48 positions shown; findings below may reference images not displayed]

FINDINGS: MRI HEAD FINDINGS

Brain: Acute left MCA territory infarcts predominantly involving the
left frontal, parietal and temporal lobes. Infarcts involve both
cortex and white matter. Small acute infarct involving the left
insula. Associated edema and sulcal effacement without midline
shift. Areas of susceptibility artifact and T2 hypointensity. Areas
of T2 hypointensity, T1 hyperintensity and susceptibility artifact
is concerning for acute hemorrhage although an element prior
hemorrhage/cortical laminar necrosis is possible posteriorly. No
hydrocephalus.

Vascular: Major arterial flow voids are maintained at the skull
base.

Skull and upper cervical spine: Normal marrow signal.

Sinuses/Orbits: Paranasal sinus mucosal thickening. No acute orbital
findings.

Other: Small left greater than right mastoid effusions.

MRA HEAD FINDINGS

Anterior circulation: The intracranial ICAs are now patent. Left
ICAs attenuated, likely due to pre-existing atherosclerosis with
probably moderate to severe paraclinoid ICA stenosis. Bilateral MCAs
and ACAs are patent without proximal hemodynamically significant
stenosis.

Posterior circulation: Chronically occluded left intradural
vertebral artery. Right intradural vertebral artery, basilar artery
and posterior cerebral arteries are patent without proximal
hemodynamically significant stenosis. Right fetal type PCA, anatomic
variant.

Anatomic variants: As detailed above.
IMPRESSION: MRI:

1. Acute left MCA territory infarcts predominantly involving the
left frontal, parietal and temporal lobes. Associated edema and
sulcal effacement without midline shift.
2. Findings concerning for associated acute hemorrhage in areas of
infarct. A noncontrast head CT could confirm and better evaluate
extent if clinically warranted.

MRA:

1. Intracranial ICAs are now patent after thrombectomy. Probably
moderate to severe left paraclinoid ICA stenosis is likely due to
pre-existing atherosclerosis.
2. Chronically occluded left vertebral artery.

These results will be called to the ordering clinician or
representative by the Radiologist Assistant, and communication
documented in the PACS or [REDACTED].

ADDENDUM:
Findings discussed with Dr. TOO via telephone at [DATE] p.m.

*** End of Addendum ***
FINDINGS: MRI HEAD FINDINGS

Brain: Acute left MCA territory infarcts predominantly involving the
left frontal, parietal and temporal lobes. Infarcts involve both
cortex and white matter. Small acute infarct involving the left
insula. Associated edema and sulcal effacement without midline
shift. Areas of susceptibility artifact and T2 hypointensity. Areas
of T2 hypointensity, T1 hyperintensity and susceptibility artifact
is concerning for acute hemorrhage although an element prior
hemorrhage/cortical laminar necrosis is possible posteriorly. No
hydrocephalus.

Vascular: Major arterial flow voids are maintained at the skull
base.

Skull and upper cervical spine: Normal marrow signal.

Sinuses/Orbits: Paranasal sinus mucosal thickening. No acute orbital
findings.

Other: Small left greater than right mastoid effusions.

MRA HEAD FINDINGS

Anterior circulation: The intracranial ICAs are now patent. Left
ICAs attenuated, likely due to pre-existing atherosclerosis with
probably moderate to severe paraclinoid ICA stenosis. Bilateral MCAs
and ACAs are patent without proximal hemodynamically significant
stenosis.

Posterior circulation: Chronically occluded left intradural
vertebral artery. Right intradural vertebral artery, basilar artery
and posterior cerebral arteries are patent without proximal
hemodynamically significant stenosis. Right fetal type PCA, anatomic
variant.

Anatomic variants: As detailed above.
IMPRESSION: MRI:

1. Acute left MCA territory infarcts predominantly involving the
left frontal, parietal and temporal lobes. Associated edema and
sulcal effacement without midline shift.
2. Findings concerning for associated acute hemorrhage in areas of
infarct. A noncontrast head CT could confirm and better evaluate
extent if clinically warranted.

MRA:

1. Intracranial ICAs are now patent after thrombectomy. Probably
moderate to severe left paraclinoid ICA stenosis is likely due to
pre-existing atherosclerosis.
2. Chronically occluded left vertebral artery.

These results will be called to the ordering clinician or
representative by the Radiologist Assistant, and communication
documented in the PACS or [REDACTED].

## 2022-03-04 IMAGING — DX DG ABDOMEN 1V
1 series · 1 of 1 positions shown · non-contrast
Comparison: None.

CLINICAL DATA: Stroke

Pre MRI screening
EXAM:
ABDOMEN - 1 VIEW

[abdomen kub]
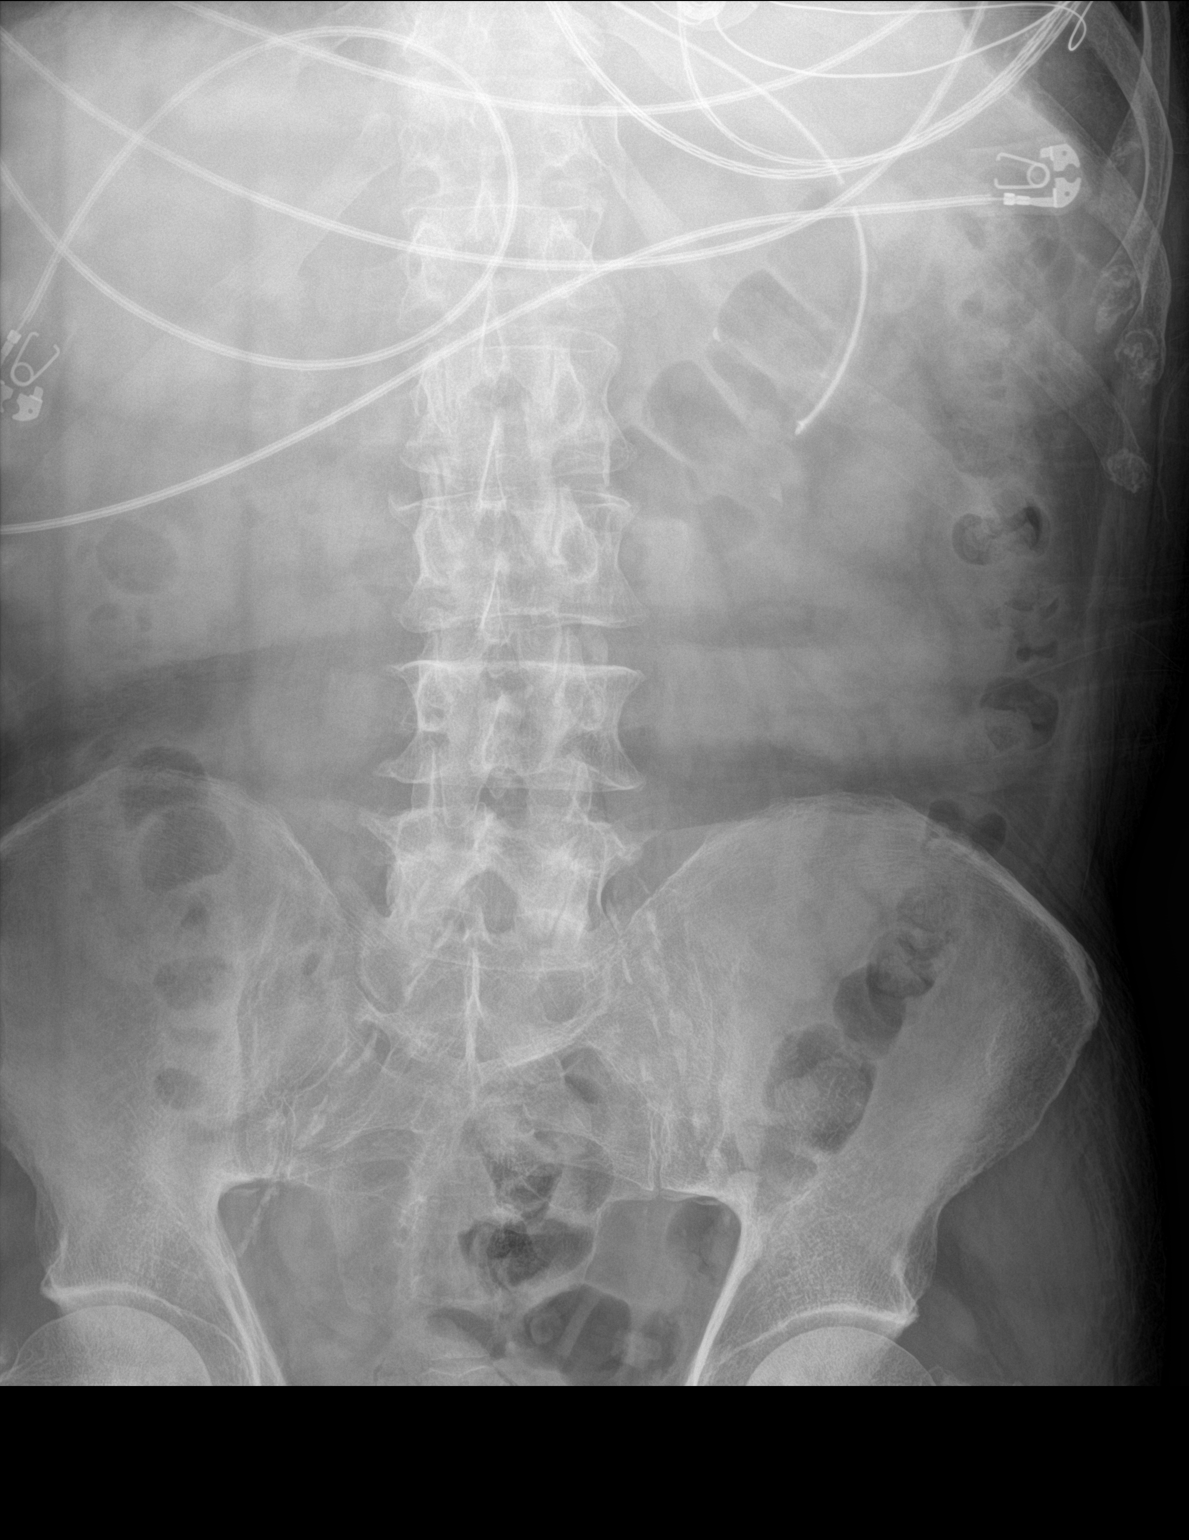

[1 of 1 positions shown; findings below may reference images not displayed]

FINDINGS: No dilated loops of bowel to indicate ileus or obstruction.
Extensive atherosclerotic arterial calcifications are noted. Mild
degenerative changes present lower lumbar spine. Enteric tube
extensive left upper quadrant in the expected site of the stomach.
No abnormal metallic densities are identified, however evaluation is
limited due to multiple overlying external artifacts.
IMPRESSION: No metallic density foreign body identified within the visualized
abdomen.

## 2022-03-04 MED ORDER — FENTANYL 2500MCG IN NS 250ML (10MCG/ML) PREMIX INFUSION
0.0000 ug/h | INTRAVENOUS | Status: DC
  Administered 2022-03-04: 200 ug/h via INTRAVENOUS
  Administered 2022-03-05: 100 ug/h via INTRAVENOUS
  Filled 2022-03-04 (×2): qty 250

## 2022-03-04 MED ORDER — FENTANYL CITRATE PF 50 MCG/ML IJ SOSY
25.0000 ug | PREFILLED_SYRINGE | INTRAMUSCULAR | Status: DC | PRN
  Administered 2022-03-04 (×3): 100 ug via INTRAVENOUS
  Administered 2022-03-06: 50 ug via INTRAVENOUS
  Filled 2022-03-04: qty 2
  Filled 2022-03-04: qty 1
  Filled 2022-03-04 (×2): qty 2

## 2022-03-04 MED ORDER — MIDAZOLAM-SODIUM CHLORIDE 100-0.9 MG/100ML-% IV SOLN
0.5000 mg/h | INTRAVENOUS | Status: DC
  Administered 2022-03-04: 0.5 mg/h via INTRAVENOUS
  Filled 2022-03-04: qty 100

## 2022-03-04 MED ORDER — MIDAZOLAM HCL 2 MG/2ML IJ SOLN
1.0000 mg | INTRAMUSCULAR | Status: DC | PRN
  Administered 2022-03-04 – 2022-03-06 (×6): 2 mg via INTRAVENOUS
  Filled 2022-03-04 (×7): qty 2

## 2022-03-04 NOTE — Progress Notes (Addendum)
STROKE TEAM PROGRESS NOTE  ? ?INTERVAL HISTORY ?Patient is seen in his room with no family at the bedside.  He was reintubated yesterday for inability to protect his airway.  After being reintubated, he had an episode of hypotension which required epinephrine and levophed transiently.  Bronchoscopy and BAL were performed.  Since then, he has been largely hemodynamically stable with a few episodes of mild hypotension.  His neurological exam remains stable.  ? ?Vitals:  ? 03/04/22 1200 03/04/22 1230 03/04/22 1245 03/04/22 1300  ?BP: 1_0 100/65  ?Pulse: (!) 59 61 60 (!) 58  ?Resp: _1 ?Temp: 98.1 ?F (36.7 ?C)     ?TempSrc: Axillary     ?SpO2: 100% 100% 98% 96%  ?Weight:      ?Height:      ? ?CBC:  ?Recent Labs  ?Lab 03/02/22 ?1813 03/02/22 ?1822 03/03/22 ?0430 03/03/22 ?1152 03/03/22 ?1410 03/03/22 ?1423 03/04/22 ?7106  ?WBC 9.0   < > 9.7  --  8.1  --  11.1*  ?NEUTROABS 7.0  --  6.9  --   --   --   --   ?HGB 17.3*   < > 15.2   < > 13.8 13.9 14.7  ?HCT 52.2*   < > 45.5   < > 43.0 41.0 46.0  ?MCV 92.7   < > 91.5  --  93.9  --  93.9  ?PLT 195   < > 166  --  132*  --  169  ? < > = values in this interval not displayed.  ? ? ?Basic Metabolic Panel:  ?Recent Labs  ?Lab 03/03/22 ?1410 03/03/22 ?1423 03/03/22 ?1638 03/04/22 ?2694  ?NA 140 141  --  140  ?K 3.1* 3.0*  --  4.3  ?CL 103  --   --  102  ?CO2 28  --   --  29  ?GLUCOSE 144*  --   --  110*  ?BUN 6*  --   --  8  ?CREATININE 0.78  --   --  0.74  ?CALCIUM 9.1  --   --  8.6*  ?MG 1.4*  --  1.7 2.6*  ?PHOS 4.4  --  4.0 4.3  ? ? ?Lipid Panel:  ?Recent Labs  ?Lab 03/03/22 ?0430  ?CHOL 115  ?TRIG 104  ?HDL 34*  ?CHOLHDL 3.4  ?VLDL 21  ?[place]ord 60  ? ? ?HgbA1c:  ?Recent Labs  ?Lab 03/03/22 ?0430  ?HGBA1C 5.4  ? ?Urine Drug Screen:  ?Recent Labs  ?Lab 03/03/22 ?8546  ?LABOPIA NONE DETECTED  ?COCAINSCRNUR NONE DETECTED  ?LABBENZ NONE DETECTED  ?AMPHETMU NONE DETECTED  ?THCU NONE DETECTED  ?LABBARB NONE DETECTED  ? ?  ?Alcohol Level  ?Recent Labs  ?Lab  03/02/22 ?1813  ?ETH <10  ? ? ? ?IMAGING past 24 hours ?DG CHEST PORT 1 VIEW ? ?Result Date: 03/03/2022 ?CLINICAL DATA:  Difficulty breathing EXAM: PORTABLE CHEST 1 VIEW COMPARISON:  Previous studies including the examination done earlier today FINDINGS: Tip of endotracheal tube is 2.7 cm above the carina. Enteric tube is noted traversing the esophagus. There is interval improvement in aeration of left lower lung fields suggesting decrease in pleural effusion and decrease in infiltrates. There is faint 1.7 cm radiopacity in the right mid lung fields partly superimposed over the anterior right fourth rib which has not changed. Small transverse linear density in the right lower lung fields may suggest scarring or subsegmental atelectasis. There is minimal blunting of both  lateral CP angles. There is no pneumothorax. IMPRESSION: There is interval improvement in aeration of left lower lung fields suggesting decrease in pleural effusion and decrease in underlying infiltrates. There is blunting of both lateral CP angles suggesting small bilateral effusions. There is no pneumothorax. There is a faint 1.7 cm radiopacity in the lateral aspect of right mid lung fields which may suggest focal pneumonitis or pleural thickening or neoplastic process. Follow-up chest radiographs and CT as warranted should be considered. Electronically Signed   By: Elmer Picker M.D.   On: 03/03/2022 15:10   ? ?PHYSICAL EXAM ?General:  Drowsy, ill-appearing patient in no acute distress ?Respiratory:  Ronchi bilaterally with large amounts of secretions ? ?Neurological: PERRL, oculocephalic reflex intact.  Unable to follow commands, nonverbal.  Will move all extremities nonpurposefully and in response to noxious stimuli ? ?ASSESSMENT/PLAN ?[name] is a 61 y.o. male with history of HTN and left MCA stroke in 8/22 presenting after having a seizure.  He taken to the ED and was then found to have right sided weakness, slurred speech and  expressive aphasia.  He was found to have left ICA occlusion on CTA head.  He was then taken for IR thrombectomy.  Left ICA was revascularized, and TICI 2c flow was achieved in left MCA M1 segment.  He was extubated after the procedure but was reintubated 3/16 for inability to protect his airway.   ? 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 s/p IR with TICI2c likely secondary large vessel disease with left ICA acute occlusion on chronic stenosis ?CTA head & neck Occlusion of left ICA just distal to bifurcation, diminished perfusion in left MCA territory, chronic occlusion of left vertebral artery, no hemodynamically significant stenosis in the neck ?CT perfusion 121 mL penumbra but no core ?Post IR CT pending ?MRI  pending ?MRI pending ?2D Echo EF 40 to 45% ?LDL 60 ?HgbA1c 5.4 ?VTE prophylaxis - lovenox ?aspirin 81 mg daily prior to admission, now on aspirin 81 mg daily and Brilinta (ticagrelor) 90 mg bid.  ?Therapy recommendations:  pending ?Disposition:  pending ? ?Respiratory Failure ?On exam, patient has audibly congested respirations, large amounts of secretions ?Ronchi in bilateral lung fields ?Intubated by CCM for inability to protect airway ?Ventilator management per CCM ?Bronchoscopy performed 3/16 with BAL, results pending ? ?Seizure ?Patient found to have a seizure prior to admission ?EEG left hemisphere cortical dysfunction, no seizure ?Keppra 500 mg BID ? ?History of stroke ?07/2021 admitted for right-sided weakness, slurred speech and aphasia.  CT head and neck showed left ICA clinoid segment severe stenosis.  Left VA occlusion.  MRI showed left MCA large infarct with petechial hemorrhage.  EF 40 to 45%.  LDL 120, A1c 5.5.  Patient discharged on aspirin 81 with residual right-sided weakness and aphasia. ? ?Left ICA stenosis/occlusion ?07/2021 CTA head and neck showed left ICA clinoid segment severe stenosis ?This admission CT head and neck showed left ICA occlusion ?Status post ICA  siphon angioplasty with residual 40 to 50% stenosis. ?MRA pending ? ?Hypertension ?Home meds:  metoprolol 12.5 mg BID ?Stable ?BP goal < 180/105 ?Long-term BP goal normotensive ? ?Hyperlipidemia ?Home meds:  atorvastatin 80 mg daily, not sure if taking PTA ?LDL 60, goal < 70 ?Now on lipitor 40 ?Continue statin at discharge ? ?Tobacco abuse ?Current smoker ?Smoking cessation counseling will be provided ? ?Other Stroke Risk Factors ? ? ?Other Active Problems ?Leukocytosis WBC 11.1 ? ?Hospital day # 2 ? ?  [place] , MSN, AGACNP-BC ?[facility] ?See Amion for schedule and pager information ?03/04/2022 1:19 PM ? ?ATTENDING NOTE: ?I reviewed above note and agree with the assessment and plan. Pt was seen and examined.  ? ?No family at bedside.  Patient still intubated, on vent.  Off sedation, however still not open eyes or follow commands.  Still has right hemiplegia, left withdrawal to pain.  MRI/MRA pending.  On aspirin Brilinta.  CCM on board for vent management.  Continue ICU care. ? ?For detailed assessment and plan, please refer to above as I have made changes wherever appropriate.  ? ?Rosalin Hawking, MD PhD ?Stroke Neurology ?03/04/2022 ?4:18 PM ? ?This patient is critically ill due to left MCA stroke, status post IR, respiratory failure, cardiomyopathy and at significant risk of neurological worsening, death form recurrent stroke, hemorrhagic transformation, heart failure, renal failure, seizure. This patient's care requires constant monitoring of vital signs, hemodynamics, respiratory and cardiac monitoring, review of multiple databases, neurological assessment, discussion with family, other specialists and medical decision making of high complexity. I spent 35 minutes of neurocritical care time in the care of this patient. ? ? ?To contact Stroke Continuity provider, please refer to [email or web address] ?After hours, contact General Neurology  ?

## 2022-03-04 NOTE — Progress Notes (Signed)
Pt transported to and from MRI on the ventilator without complications. ?

## 2022-03-04 NOTE — Progress Notes (Signed)
? ?NAME:  Todd Estrada, MRN:  [id number], DOB:  November 19, 1961, LOS: 2 ?ADMISSION DATE:  03/02/2022, CONSULTATION DATE:  03/03/2022 ?REFERRING MD:  Erlinda Hong, CHIEF COMPLAINT:  AMS, seizure  ? ?History of Present Illness:  ?Patient is a 61 year old male with past medical history of hypertension, left MCA stroke in 8115 complicated by petechial hemorrhage with ongoing residual deficits presenting with witnessed seizure-like activity with ongoing dysarthria and aphasia.  Patient was loaded with Keppra in the ED.  This was initially thought to be in setting of postictal state; however, with ongoing symptoms, neurology consulted and code stroke activated in the ED.  CT head negative for any acute intracranial process however, CTA notable for occlusion of left ICA.  Patient underwent thrombectomy and stenting of left ICA and was subsequently transferred to neurological ICU.  This morning, patient with continued altered mental status and unresponsiveness.  PCCM consulted for concerns of inability protect airway. ?  ?Patient is altered and unable to contact family at this time. History obtained by chart review.  ? ?Pertinent  Medical History  ? ?Past Medical History:  ?Diagnosis Date  ? 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 ?Significant Hospital Events: ?Including procedures, antibiotic start and stop dates in addition to other pertinent events   ?3/15 admitted for acute L MCA stroke due to L ICA occlusion with first time seizure s/p revascularization of L ICA occlusion ?3/16 Intubated for airway protection  ? ?Interim History / Subjective:  ?Overnight no acute events. Patient remains on vent support. Is withdrawing to painful stimuli today but not following commands.  ? ?Objective   ?Blood pressure 106/70, pulse (!) 51, temperature 98.6 ?F (37 ?C), temperature source Oral, resp. rate (!) 23, height 6' (1.829 m), weight 84.7 kg, SpO2 99 %. ?   ?Vent Mode: PRVC ?FiO2 (%):  [40 %-100  %] 40 % ?Set Rate:  [16 bmp-20 bmp] 20 bmp ?Vt Set:  [620 mL] 620 mL ?PEEP:  [5 cmH20] 5 cmH20 ?Plateau Pressure:  [14 cmH20-22 cmH20] 22 cmH20  ? ?Intake/Output Summary (Last 24 hours) at 03/04/2022 0734 ?Last data filed at 03/04/2022 0700 ?Gross per 24 hour  ?Intake 3386.37 ml  ?Output 1175 ml  ?Net 2211.37 ml  ? ?Filed Weights  ? 03/02/22 2000 03/03/22 0339 03/03/22 1000  ?Weight: 87.9 kg 84.7 kg 84.7 kg  ? ? ?Examination: ?General: chronically ill appearing unkempt elderly male, on vent ?HENT: [place]/AT, PERRL, edentulous, ETT in place ?Lungs: diffuse rhonchi, on PRVC with FiO2 40%, PEEP 5 ?Cardiovascular: RRR, S1 and S2 present, no m/r/g, distal pulses intact ?Abdomen: soft, nondistended, +BS ?Extremities: normal bulk and tone; no peripheral edema ?Neuro: somnolent but does intermittently move extremities to painful stimuli; LUE>RUE. PERRL. +gag reflex ?Skin: warm and dry ? ?Resolved Hospital Problem list   ? ? ? ?Assessment & Plan:  ?Acute encephalopathy 2/2 left MCA stroke 2/2 ICA occlusion s/p thrombectomy and stent placement  ?Seizure activity  ?Patient presented with witnessed seizure activity with CTA Head/Neck significant for left ICA occlusion and diminished perfusion in left MCA. Underwent thrombectomy and stenting of L ICA with IR.  ?- Neurology following ?- Frequent neuro checks ?- Continue brilinta and aspirin and statin ?- F/u MRI Brain and MRA Head today ?- Continue Keppra bid ?- Seizure precautions  ? ?Acute hypoxic respiratory failure with aspiration pneumonia ?Patient intubated on 3/16 for inability to protect airways. Noted to have copious secretions from ETT for  which bedside bronchoscopy performed. CXR concerning for aspiration pneumonia. Resp culture with few gram neg and gram possitive cocci in chains.  ?- Continue full vent support ?- Continue IV Unasyn day 2/5 ?- VAP bundle ? ?Concern for ETOH use ?- CIWA w/ Ativan prn agitation or seizures ?- Continue thiamine and folate ? ?Combined systolic  and diastolic HF ?Hypertension ?EF 40-45% with grade I DD. No significant valvular abnormalities noted. Currently appears euvolemic. ?- Required levo yesterday for BP support. Currently normotensive off levophed.  ?- Continue to monitor. Holding home metoprolol  ?- Continue to monitor  ? ?Best Practice (right click and "Reselect all SmartList Selections" daily)  ? ?Diet/type: tubefeeds ?DVT prophylaxis: LMWH ?GI prophylaxis: PPI ?Lines: N/A ?Foley:  Yes, and it is no longer needed and removal ordered  ?Code Status:  full code ?Last date of multidisciplinary goals of care discussion [3/16] ? ?Labs   ?CBC: ?Recent Labs  ?Lab 03/02/22 ?1813 03/02/22 ?1822 03/03/22 ?0101 03/03/22 ?0430 03/03/22 ?1152 03/03/22 ?1410 03/03/22 ?1423 03/04/22 ?8527  ?WBC 9.0  --  8.2 9.7  --  8.1  --  11.1*  ?NEUTROABS 7.0  --   --  6.9  --   --   --   --   ?HGB 17.3*   < > 14.4 15.2 14.6 13.8 13.9 14.7  ?HCT 52.2*   < > 44.6 45.5 43.0 43.0 41.0 46.0  ?MCV 92.7  --  93.9 91.5  --  93.9  --  93.9  ?PLT 195  --  158 166  --  132*  --  169  ? < > = values in this interval not displayed.  ? ? ?Basic Metabolic Panel: ?Recent Labs  ?Lab 03/02/22 ?1813 03/02/22 ?1822 03/03/22 ?0101 03/03/22 ?0430 03/03/22 ?1152 03/03/22 ?1410 03/03/22 ?1423 03/03/22 ?1638 03/04/22 ?7824  ?NA 135 138  --  136 138 140 141  --  140  ?K 4.5 4.6  --  3.5 3.6 3.1* 3.0*  --  4.3  ?CL 101 102  --  101  --  103  --   --  102  ?CO2 22  --   --  26  --  28  --   --  29  ?GLUCOSE 97 99  --  103*  --  144*  --   --  110*  ?BUN 11 13  --  6*  --  6*  --   --  8  ?CREATININE 0.99 0.90 0.74 0.75  --  0.78  --   --  0.74  ?CALCIUM 8.9  --   --  8.5*  --  9.1  --   --  8.6*  ?MG  --   --   --   --   --  1.4*  --  1.7 2.6*  ?PHOS  --   --   --   --   --  4.4  --  4.0 4.3  ? ?GFR: ?Estimated Creatinine Clearance: 106.4 mL/min (by C-G formula based on SCr of 0.74 mg/dL). ?Recent Labs  ?Lab 03/03/22 ?0101 03/03/22 ?0430 03/03/22 ?1410 03/04/22 ?2353  ?WBC 8.2 9.7 8.1 11.1*   ?LATICACIDVEN  --   --  1.4  --   ? ? ?Liver Function Tests: ?Recent Labs  ?Lab 03/02/22 ?1813  ?AST 31  ?ALT 22  ?ALKPHOS 58  ?BILITOT 1.4*  ?PROT 6.3*  ?ALBUMIN 3.6  ? ?No results for input(s): LIPASE, AMYLASE in the last 168 hours. ?No results for input(s): AMMONIA in  the last 168 hours. ? ?ABG ?   ?Component Value Date/Time  ? PHART 7.318 (L) 03/03/2022 1423  ? PCO2ART 60.5 (H) 03/03/2022 1423  ? PO2ART 193 (H) 03/03/2022 1423  ? HCO3 31.1 (H) 03/03/2022 1423  ? TCO2 33 (H) 03/03/2022 1423  ? O2SAT 100 03/03/2022 1423  ?  ? ?Coagulation Profile: ?No results for input(s): INR, PROTIME in the last 168 hours. ? ?Cardiac Enzymes: ?No results for input(s): CKTOTAL, CKMB, CKMBINDEX, TROPONINI in the last 168 hours. ? ?HbA1C: ?Hgb A1c MFr Bld  ?Date/Time Value Ref Range Status  ?03/03/2022 04:30 AM 5.4 4.8 - 5.6 % Final  ?  Comment:  ?  (NOTE) ?        Prediabetes: 5.7 - 6.4 ?        Diabetes: >6.4 ?        Glycemic control for adults with diabetes: <7.0 ?  ?08/19/2021 07:49 AM 5.5 4.8 - 5.6 % Final  ?  Comment:  ?  (NOTE) ?Pre diabetes:          5.7%-6.4% ? ?Diabetes:              >6.4% ? ?Glycemic control for   <7.0% ?adults with diabetes ?  ? ? ?CBG: ?Recent Labs  ?Lab 03/02/22 ?1845 03/03/22 ?1602 03/03/22 ?1919 03/03/22 ?2301 03/04/22 ?9323  ?GLUCAP 121* 116* 129* 161* 165*  ? ? ?Critical care time: 35 minutes ?  ? ? ? ? ? ?

## 2022-03-04 NOTE — TOC CAGE-AID Note (Signed)
Transition of Care (TOC) - CAGE-AID Screening ? ? ?Patient Details  ?Name: Todd Estrada ?MRN: [id number] ?Date of Birth: June 23, 1961 ? ?Transition of Care (TOC) CM/SW Contact:    ?Dierre Crevier C Tarpley-Carter, LCSWA ?Phone Number: ?03/04/2022, 2:19 PM ? ? ?Clinical Narrative: ?Pt is unable to participate in Cage Aid.  Pt was reintubated to protect airways. CSW will assess at a better time. ? ?Passenger transport manager, MSW, LCSW-A ?Pronouns:  She/Her/Hers ?[facility]Transitions of Care ?Clinical Social Worker ?Direct Number:  [phone] ?[email or web address] ? ?CAGE-AID Screening: ?Substance Abuse Screening unable to be completed due to: : Patient unable to participate (Pt was reintubated to protect airways.) ? ?  ?  ?  ?  ?  ? ?Substance Abuse Education Offered: Yes ? ?Substance abuse interventions: Educational Materials ? ? ? ? ? ? ?

## 2022-03-04 NOTE — Progress Notes (Signed)
Referring Physician(s): Dr. Fatima Sanger  Supervising Physician: Julieanne Cotton  Patient Status:  [facility] - In-pt  Chief Complaint:  S/p endovascular complete revascularization of occluded left internal carotid artery extracranially and intracranially  S/p revascularization of occluded left middle cerebral M1 segment achieving aTICI 2c revascularization  Subjective:  Pt mechanically ventilated. Primary RN reports intermittent movement of extremities but does not follow commands   Allergies: Patient has no known allergies.  Medications: Prior to Admission medications   Medication Sig Start Date End Date Taking? Authorizing Provider  aspirin EC 81 MG EC tablet Take 1 tablet (81 mg total) by mouth daily. Swallow whole. 08/21/21   Erick Blinks, MD  atorvastatin (LIPITOR) 80 MG tablet Take 1 tablet (80 mg total) by mouth daily. 08/21/21   Erick Blinks, MD  metoprolol tartrate (LOPRESSOR) 25 MG tablet Take 0.5 tablets (12.5 mg total) by mouth 2 (two) times daily. 08/21/21   Erick Blinks, MD     Vital Signs: BP 140/77   Pulse (!) 59   Temp 98.4 F (36.9 C) (Axillary)   Resp 20   Ht 6' (1.829 m)   Wt 186 lb 11.7 oz (84.7 kg)   SpO2 100%   BMI 25.33 kg/m   Physical Exam Vitals reviewed.  Constitutional:      Comments: Mechanically ventilated   Cardiovascular:     Rate and Rhythm: Normal rate.  Pulmonary:     Comments: Mechanically ventilated  Neurological:     Comments: Unable to perform     Imaging: CT HEAD WO CONTRAST  Result Date: 03/02/2022 CLINICAL DATA:  Seizure like activity, postictal, history of alcohol abuse EXAM: CT HEAD WITHOUT CONTRAST TECHNIQUE: Contiguous axial images were obtained from the base of the skull through the vertex without intravenous contrast. RADIATION DOSE REDUCTION: This exam was performed according to the departmental dose-optimization program which includes automated exposure control, adjustment of the mA and/or kV according  to patient size and/or use of iterative reconstruction technique. COMPARISON:  08/20/2021 FINDINGS: Brain: Chronic left MCA territory infarct again identified, with interval dystrophic calcification in the infarct bed since prior study. Chronic ischemic changes are again identified throughout the periventricular and subcortical white matter, left basal ganglia, and left cerebellar hemisphere. No evidence of acute infarct or hemorrhage. Lateral ventricles and remaining midline structures are unremarkable. No acute extra-axial fluid collections. No mass effect. Vascular: No hyperdense vessel or unexpected calcification. Skull: Normal. Negative for fracture or focal lesion. Sinuses/Orbits: Chronic opacification right maxillary sinus. Stable mucosal thickening left maxillary sinus. Other: None. IMPRESSION: 1. Chronic ischemic changes as above. No acute intracranial process. Electronically Signed   By: Sharlet Salina M.D.   On: 03/02/2022 19:57   CT CERVICAL SPINE WO CONTRAST  Result Date: 03/02/2022 CLINICAL DATA:  Seizure like activity, history of alcohol abuse, neck trauma EXAM: CT CERVICAL SPINE WITHOUT CONTRAST TECHNIQUE: Multidetector CT imaging of the cervical spine was performed without intravenous contrast. Multiplanar CT image reconstructions were also generated. RADIATION DOSE REDUCTION: This exam was performed according to the departmental dose-optimization program which includes automated exposure control, adjustment of the mA and/or kV according to patient size and/or use of iterative reconstruction technique. COMPARISON:  None. FINDINGS: Alignment: Alignment is anatomic. Skull base and vertebrae: No acute fracture. No primary bone lesion or focal pathologic process. Soft tissues and spinal canal: No prevertebral fluid or swelling. No visible canal hematoma. Disc levels: Multilevel cervical spondylosis, with prominent anterior osteophyte formation from C4-5 through C6-7. Circumferential disc osteophyte  complex  at C3-4 and C6-7 results in right greater than left neural foraminal narrowing. Multilevel facet hypertrophy greatest from C4 through C6. Upper chest: Central airways patent.  Severe upper lobe emphysema. Other: Reconstructed images demonstrate no additional findings. IMPRESSION: 1. No acute cervical spine fracture. 2. Multilevel cervical spondylosis and facet hypertrophy as above. 3. Emphysema. Electronically Signed   By: Sharlet Salina M.D.   On: 03/02/2022 20:00   DG CHEST PORT 1 VIEW  Result Date: 03/03/2022 CLINICAL DATA:  Difficulty breathing EXAM: PORTABLE CHEST 1 VIEW COMPARISON:  Previous studies including the examination done earlier today FINDINGS: Tip of endotracheal tube is 2.7 cm above the carina. Enteric tube is noted traversing the esophagus. There is interval improvement in aeration of left lower lung fields suggesting decrease in pleural effusion and decrease in infiltrates. There is faint 1.7 cm radiopacity in the right mid lung fields partly superimposed over the anterior right fourth rib which has not changed. Small transverse linear density in the right lower lung fields may suggest scarring or subsegmental atelectasis. There is minimal blunting of both lateral CP angles. There is no pneumothorax. IMPRESSION: There is interval improvement in aeration of left lower lung fields suggesting decrease in pleural effusion and decrease in underlying infiltrates. There is blunting of both lateral CP angles suggesting small bilateral effusions. There is no pneumothorax. There is a faint 1.7 cm radiopacity in the lateral aspect of right mid lung fields which may suggest focal pneumonitis or pleural thickening or neoplastic process. Follow-up chest radiographs and CT as warranted should be considered. Electronically Signed   By: Ernie Avena M.D.   On: 03/03/2022 15:10   DG CHEST PORT 1 VIEW  Result Date: 03/03/2022 CLINICAL DATA:  ETT and OG tube placement EXAM: PORTABLE CHEST 1  VIEW COMPARISON:  Radiograph 03/02/2022 FINDINGS: Endotracheal tube overlies the midthoracic trachea. Orogastric tube tip and side port overlie the stomach. Obscured cardiomediastinal silhouette on the left. The patient is rotated but there appears to be leftward mediastinal shift. There is a small to moderate size left pleural effusion. There is left mid to lower lung airspace consolidation. Elevated left hemidiaphragm. There is a nodular opacity overlying the right peripheral mid lung which measures 1.7 cm. IMPRESSION: Small to moderate size left pleural effusion with increased left mid to lower lung opacities and persistent right peripheral mid lung patchy opacity. Findings are concerning for pneumonia/aspiration. The right peripheral opacity may or may not be related to the new process in the left mid to lower lung. Recommend continued radiographic follow-up, this may require chest CT for further evaluation. Electronically Signed   By: Caprice Renshaw M.D.   On: 03/03/2022 11:44   DG Chest Portable 1 View  Result Date: 03/02/2022 CLINICAL DATA:  Seizure like activity.  Cough. EXAM: PORTABLE CHEST 1 VIEW COMPARISON:  None. FINDINGS: Heart size is normal. Artifact overlies the chest. Patient probably has chronic lung disease with possible emphysema and chronic lung markings. Few areas of patchy density in the mid and lower lungs could possibly represent mild pneumonia. I would suggest a two-view chest radiography exam be performed when able, particularly with respect to a possible nodular shadow in the right mid lung which could be better evaluated at that time. No effusion. No acute bone finding. IMPRESSION: Probable chronic lung disease. Emphysema and pulmonary scarring suspected. Question few scattered patchy densities in the mid and lower lungs that could possibly be inflammatory, but two-view chest radiography is recommended when able to assess for the possibility  of pulmonary nodules. Electronically Signed    By: Paulina Fusi M.D.   On: 03/02/2022 18:38   EEG adult  Result Date: 03/03/2022 Charlsie Quest, MD     03/03/2022  9:19 AM Patient Name: Todd Estrada MRN: [id number] Epilepsy Attending: Charlsie Quest Referring Physician/Provider: Erick Blinks, MD Date: 03/03/2022 Duration: 24.42 mins Patient history: 61 y.o. 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 EEG to evaluate for seizure Level of alertness: lethargic AEDs during EEG study: LEV, Ativan Technical aspects: This EEG study was done with scalp electrodes positioned according to the 10-20 International system of electrode placement. Electrical activity was acquired at a sampling rate of 500Hz  and reviewed with a high frequency filter of 70Hz  and a low frequency filter of 1Hz . EEG data were recorded continuously and digitally stored. Description: EEG showed continuous low amplitude 3 to 5 Hz theta-delta slowing in left hemisphere. Additionally there was 8 to 10 Hz alpha activity in right hemisphere. Hyperventilation and photic stimulation were not performed.   ABNORMALITY - Continuous slow, left hemisphere IMPRESSION: This study is suggestive of cortical dysfunction in left hemisphere likely secondary to underlying stroke. No seizures or definite epileptiform discharges were seen throughout the recording. Charlsie Quest   ECHOCARDIOGRAM COMPLETE  Result Date: 03/03/2022    ECHOCARDIOGRAM REPORT   Patient Name:   Jacory DIEDRICH IACONO Date of Exam: 03/03/2022 Medical Rec #:  [id number]     Height:       72.0 in Accession #:    [id number]    Weight:       186.7 lb Date of Birth:  [birth year]     BSA:          2.069 m Patient Age:    60 years       BP:           176/72 mmHg Patient Gender: M             HR:           85 bpm. Exam Location:  Inpatient Procedure: 2D Echo, Color Doppler and Cardiac Doppler Indications:    Stroke i63.9  History:        Patient has prior history of Echocardiogram examinations, most                 recent 08/19/2021. Risk Factors:Hypertension.  Sonographer:    Irving Burton Senior RDCS Referring Phys: [id number] [facility]  Sonographer Comments: Very poor parasternal window, scanned upright due to airway concerns. IMPRESSIONS  1. Left ventricular ejection fraction, by estimation, is 40 to 45%. The left ventricle has mildly decreased function. The left ventricle has no regional wall motion abnormalities. Left ventricular diastolic parameters are consistent with Grade I diastolic dysfunction (impaired relaxation).  2. Right ventricular systolic function is normal. The right ventricular size is normal.  3. The mitral valve was not well visualized. Trivial mitral valve regurgitation.  4. The aortic valve was not well visualized. Aortic valve regurgitation is not visualized. No aortic stenosis is present.  5. The inferior vena cava is normal in size with greater than 50% respiratory variability, suggesting right atrial pressure of 3 mmHg. FINDINGS  Left Ventricle: Left ventricular ejection fraction,  by estimation, is 40 to 45%. The left ventricle has mildly decreased function. The left ventricle has no regional wall motion abnormalities. The left ventricular internal cavity size was normal in size. There is no left ventricular hypertrophy. Left ventricular diastolic parameters are consistent with Grade I diastolic dysfunction (impaired relaxation). Right Ventricle: The right ventricular size is normal. Right vetricular wall thickness was not well visualized. Right ventricular systolic function is normal. Left Atrium: Left atrial size was normal in size. Right Atrium: Right atrial size was normal in size. Pericardium: There is no evidence of  pericardial effusion. Mitral Valve: The mitral valve was not well visualized. Trivial mitral valve regurgitation. Tricuspid Valve: The tricuspid valve is not well visualized. Tricuspid valve regurgitation is not demonstrated. Aortic Valve: The aortic valve was not well visualized. Aortic valve regurgitation is not visualized. No aortic stenosis is present. Pulmonic Valve: The pulmonic valve was not well visualized. Pulmonic valve regurgitation is not visualized. Aorta: The ascending aorta was not well visualized. Venous: The inferior vena cava is normal in size with greater than 50% respiratory variability, suggesting right atrial pressure of 3 mmHg. IAS/Shunts: The atrial septum is grossly normal.  LEFT VENTRICLE PLAX 2D LVOT diam:     2.30 cm LV SV:         63 LV SV Index:   31 LVOT Area:     4.15 cm  LV Volumes (MOD) LV vol d, MOD A2C: 181.0 ml LV vol d, MOD A4C: 184.0 ml LV vol s, MOD A2C: 115.0 ml LV vol s, MOD A4C: 109.0 ml LV SV MOD A2C:     66.0 ml LV SV MOD A4C:     184.0 ml LV SV MOD BP:      73.1 ml RIGHT VENTRICLE RV S prime:     11.20 cm/s TAPSE (M-mode): 2.2 cm LEFT ATRIUM             Index        RIGHT ATRIUM           Index LA Vol (A2C):   66.5 ml 32.14 ml/m  RA Area:     14.20 cm LA Vol (A4C):   39.1 ml 18.90 ml/m  RA Volume:   34.60 ml  16.72 ml/m LA Biplane Vol: 55.2 ml 26.68 ml/m  AORTIC VALVE LVOT Vmax:   84.73 cm/s LVOT Vmean:  55.000 cm/s LVOT VTI:    0.152 m  SHUNTS Systemic VTI:  0.15 m Systemic Diam: 2.30 cm Kristeen Miss MD Electronically signed by Kristeen Miss MD Signature Date/Time: 03/03/2022/1:57:20 PM    Final    CT ANGIO HEAD NECK W WO CM W PERF (CODE STROKE)  Result Date: 03/02/2022 CLINICAL DATA:  Stroke suspected, seizure like activity, prior left MCA stroke EXAM: CT ANGIOGRAPHY HEAD AND NECK CT PERFUSION BRAIN TECHNIQUE: Multidetector CT imaging of the head and neck was performed using the standard protocol during bolus administration of intravenous contrast.  Multiplanar CT image reconstructions and MIPs were obtained to evaluate the vascular anatomy. Carotid stenosis measurements (when applicable) are obtained utilizing NASCET criteria, using the distal internal carotid diameter as the denominator. Multiphase CT imaging of the brain was performed following IV bolus contrast injection. Subsequent parametric perfusion maps were calculated using RAPID software. RADIATION DOSE REDUCTION: This exam was performed according to the departmental dose-optimization program which includes automated exposure control, adjustment of the mA and/or kV according to patient size and/or use of iterative reconstruction technique. CONTRAST:  OMNIPAQUE IOHEXOL 350 MG/ML  SOLN COMPARISON:  CTA head neck 08/18/2021, same day CT head FINDINGS: CT HEAD FINDINGS For noncontrast findings, please see same day CT head. CTA NECK FINDINGS Aortic arch: Standard branching. Imaged portion shows no evidence of aneurysm or dissection. No significant stenosis of the major arch vessel origins. Right carotid system: No evidence of dissection, stenosis (50% or greater) or occlusion. Calcifications at the bifurcation and in the proximal right ICA are not hemodynamically significant. Left carotid system: Significant noncalcified luminal narrowing in the proximal left ICA (series 7, image 241), just distal to the bifurcation with diminishing contrast extending superiorly and non opacification from the proximal to mid ICA on (series 7, images 236, 231, 225). The left common carotid artery is patent, without evidence of dissection or occlusion. The ECA appears patent. 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 The right vertebral artery is patent from its origin to the skull base, with scattered calcifications, which are not hemodynamically significant. Skeleton: No acute osseous abnormality. Degenerative changes in the cervical spine. Other neck:  Negative. Upper chest: Severe centrilobular and paraseptal emphysema. No focal pulmonary opacity or pleural effusion. Review of the MIP images confirms the above findings CTA HEAD FINDINGS Anterior circulation: Non opacification of the intracranial left ICA, with reconstitution in the supraclinoid segment, likely retrograde flow. The right intracranial ICA is patent with mild calcifications but without significant stenosis. A1 segments patent. Normal anterior communicating artery. Anterior cerebral arteries are patent to their distal aspects. No M1 stenosis or occlusion. Normal MCA bifurcations. Distal MCA branches perfused, although there is mildly decreased opacification of the left MCA branches compared to the right, without focal stenosis. Posterior circulation: The left vertebral artery is chronically occluded, with minimal opacification distally, likely retrograde. The right vertebral artery is patent to the vertebrobasilar junction. The right PICA is visualized. Basilar patent to its distal aspect. Superior cerebellar arteries patent bilaterally. Patent left P1 segment. Fetal origin of the right PCA, with a patent right posterior communicating artery. The left posterior communicating artery is not visualized. PCAs perfused to their distal aspects without stenosis. Venous sinuses: As permitted by contrast timing, patent. Anatomic variants: Fetal origin of the right PCA Review of the MIP images confirms the above findings CT Brain Perfusion Findings: CBF (<30%) Volume: 0mL Perfusion (Tmax>6.0s) volume: Mismatch Volume: Infarction Location:No infarct core. Territory at risk is largely left MCA territory. IMPRESSION: 1. Occlusion of the left ICA, from just distal to the bifurcation through the supraclinoid segment, where it reconstitutes, likely retrograde. This is new from the prior exam. 2. Diminished perfusion primarily in the left MCA territory on both visual inspection and CT perfusion study,  where there is 121 mL of ischemic penumbra but no infarct core. 3. 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 4. No other intracranial large vessel occlusion or significant stenosis. No other hemodynamically significant stenosis in the neck. Code stroke imaging results were discussed on 03/02/2022 at 8:45 pm with provider Dr. Derry Lory via telephone, who verbally acknowledged these results. Electronically Signed   By: Wiliam Ke M.D.   On: 03/02/2022 20:57    Labs:  CBC: Recent Labs    03/03/22 0101 03/03/22 0430 03/03/22 1152 03/03/22 1410 03/03/22 1423 03/04/22 0531  WBC 8.2 9.7  --  8.1  --  11.1*  HGB 14.4 15.2 14.6 13.8 13.9 14.7  HCT 44.6 45.5 43.0 43.0 41.0 46.0  PLT  158 166  --  132*  --  169    COAGS: Recent Labs    08/18/21 1818  INR 1.0  APTT 31    BMP: Recent Labs    03/02/22 1813 03/02/22 1822 03/03/22 0101 03/03/22 0430 03/03/22 1152 03/03/22 1410 03/03/22 1423 03/04/22 0531  NA 135 138  --  136 138 140 141 140  K 4.5 4.6  --  3.5 3.6 3.1* 3.0* 4.3  CL 101 102  --  101  --  103  --  102  CO2 22  --   --  26  --  28  --  29  GLUCOSE 97 99  --  103*  --  144*  --  110*  BUN 11 13  --  6*  --  6*  --  8  CALCIUM 8.9  --   --  8.5*  --  9.1  --  8.6*  CREATININE 0.99 0.90 0.74 0.75  --  0.78  --  0.74  GFRNONAA >60  --  >60 >60  --  >60  --  >60    LIVER FUNCTION TESTS: Recent Labs    08/18/21 1818 08/19/21 1416 03/02/22 1813  BILITOT 0.6 1.6* 1.4*  AST 15 26 31   ALT 13 12 22   ALKPHOS 86 70 58  PROT 7.4 7.4 6.3*  ALBUMIN 3.8 3.7 3.6    Assessment and Plan:  S/p endovascular complete revascularization of occluded left internal carotid artery extracranially and intracranially.  S/p revascularization of occluded left middle cerebral M1 segment achieving aTICI 2c revascularization.  Pt mechanically ventilated Primary RN reports intermittent movement of extremities but  does not follow commands. Unable to perform neuro exam  Neuro/IR to follow  Electronically Signed: Shon Hough, NP 03/04/2022, 9:33 AM   I spent a total of 15 Minutes at the the patient's bedside AND on the patient's hospital floor or unit, greater than 50% of which was counseling/coordinating care for S/p endovascular complete revascularization of occluded left internal carotid artery extracranially and intracranially. S/p revascularization of occluded left middle cerebral M1 segment achieving aTICI 2c revascularization.

## 2022-03-05 ENCOUNTER — Inpatient Hospital Stay (HOSPITAL_COMMUNITY): Payer: Medicaid Other

## 2022-03-05 DIAGNOSIS — R569 Unspecified convulsions: Secondary | ICD-10-CM | POA: Diagnosis not present

## 2022-03-05 DIAGNOSIS — I63512 Cerebral infarction due to unspecified occlusion or stenosis of left middle cerebral artery: Secondary | ICD-10-CM | POA: Diagnosis not present

## 2022-03-05 DIAGNOSIS — I6522 Occlusion and stenosis of left carotid artery: Secondary | ICD-10-CM | POA: Diagnosis not present

## 2022-03-05 DIAGNOSIS — Z978 Presence of other specified devices: Secondary | ICD-10-CM | POA: Diagnosis not present

## 2022-03-05 LAB — BASIC METABOLIC PANEL
Anion gap: 8 (ref 5–15)
BUN: 14 mg/dL (ref 8–23)
CO2: 29 mmol/L (ref 22–32)
Calcium: 8.5 mg/dL — ABNORMAL LOW (ref 8.9–10.3)
Chloride: 104 mmol/L (ref 98–111)
Creatinine, Ser: 0.76 mg/dL (ref 0.61–1.24)
GFR, Estimated: >60 mL/min (ref >60)
Glucose, Bld: 98 mg/dL (ref 70–99)
Potassium: 3.7 mmol/L (ref 3.5–5.1)
Sodium: 141 mmol/L (ref 135–145)

## 2022-03-05 LAB — GLUCOSE, CAPILLARY
Glucose-Capillary: 113 mg/dL — ABNORMAL HIGH (ref 70–99)
Glucose-Capillary: 115 mg/dL — ABNORMAL HIGH (ref 70–99)
Glucose-Capillary: 117 mg/dL — ABNORMAL HIGH (ref 70–99)
Glucose-Capillary: 138 mg/dL — ABNORMAL HIGH (ref 70–99)
Glucose-Capillary: 83 mg/dL (ref 70–99)
Glucose-Capillary: 96 mg/dL (ref 70–99)

## 2022-03-05 LAB — CULTURE, RESPIRATORY W GRAM STAIN
Culture: NORMAL
Culture: NORMAL

## 2022-03-05 LAB — CBC
HCT: 39 % (ref 39.0–52.0)
Hemoglobin: 12.8 g/dL — ABNORMAL LOW (ref 13.0–17.0)
MCH: 30.7 pg (ref 26.0–34.0)
MCHC: 32.8 g/dL (ref 30.0–36.0)
MCV: 93.5 fL (ref 80.0–100.0)
Platelets: 129 10*3/uL — ABNORMAL LOW (ref 150–400)
RBC: 4.17 MIL/uL — ABNORMAL LOW (ref 4.22–5.81)
RDW: 13.7 % (ref 11.5–15.5)
WBC: 7.6 10*3/uL (ref 4.0–10.5)
nRBC: 0 % (ref 0.0–0.2)

## 2022-03-05 IMAGING — DX DG ABD PORTABLE 1V
2 series · 2 of 2 positions shown · non-contrast
Comparison: Radiograph yesterday.

CLINICAL DATA: Acute ischemic left MCA stroke.

EXAM:
PORTABLE ABDOMEN - 1 VIEW

[abdomen kub (1 of 2)]
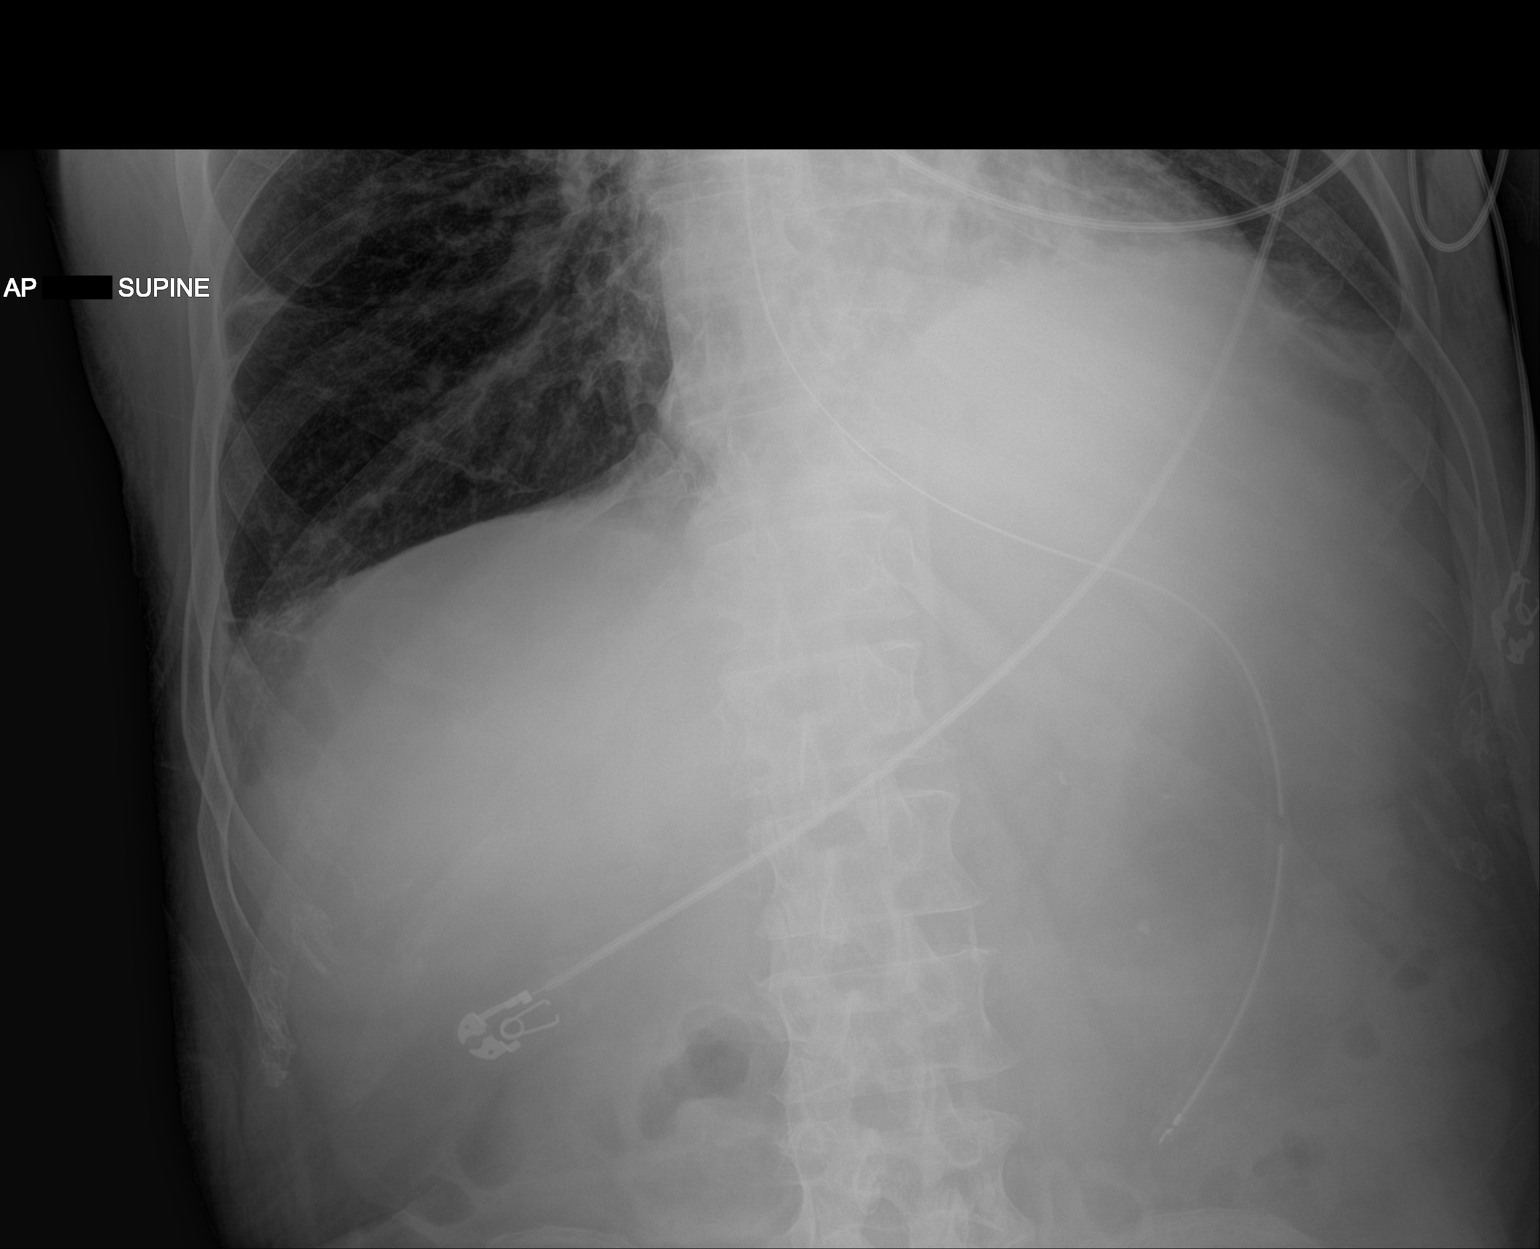

[abdomen kub (2 of 2)]
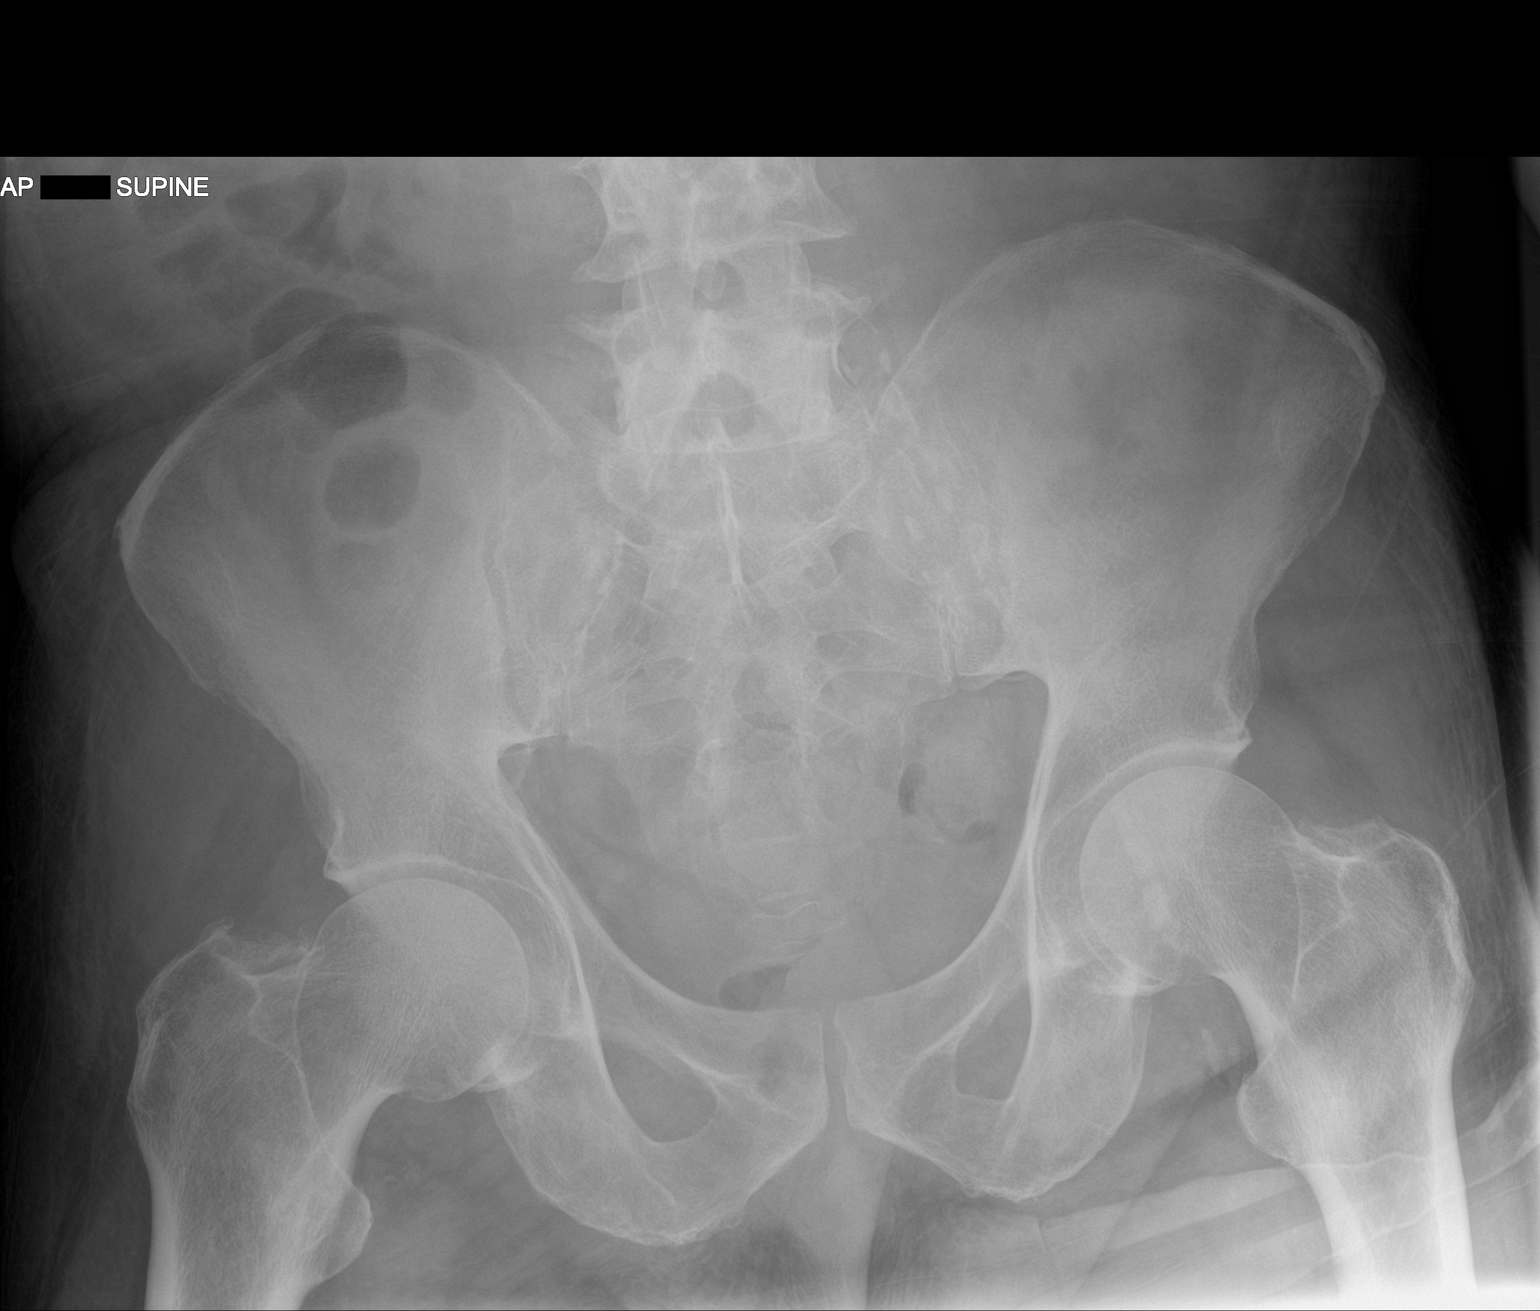

[2 of 2 positions shown; findings below may reference images not displayed]

FINDINGS: Tip and side port of the enteric tube below the diaphragm in the
stomach. Nonobstructive bowel gas pattern. Prominent vascular
calcifications.
IMPRESSION: Tip and side port of the enteric tube below the diaphragm in the
stomach.

## 2022-03-05 IMAGING — CT CT HEAD W/O CM
3 series · 17 of 37 positions shown, 19 images · non-contrast
Comparison: MRI brain [DATE]

CLINICAL DATA: Stroke follow-up



[Series 3: head wo · axial · 0.45mm/px · z∈[-156,-22]mm · 7 of 37 slices shown, 9 images]
[im 5/37  brain]
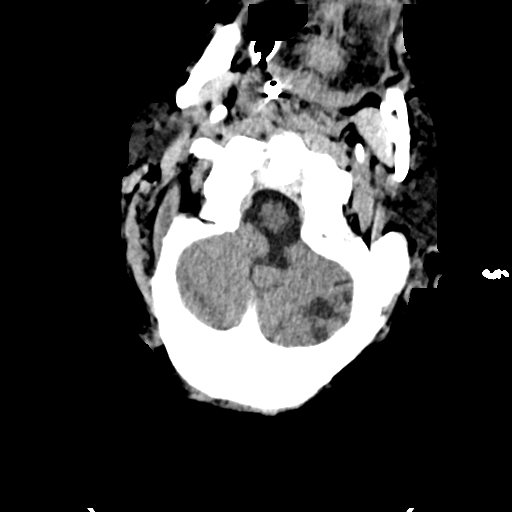
[im 5/37  bone]
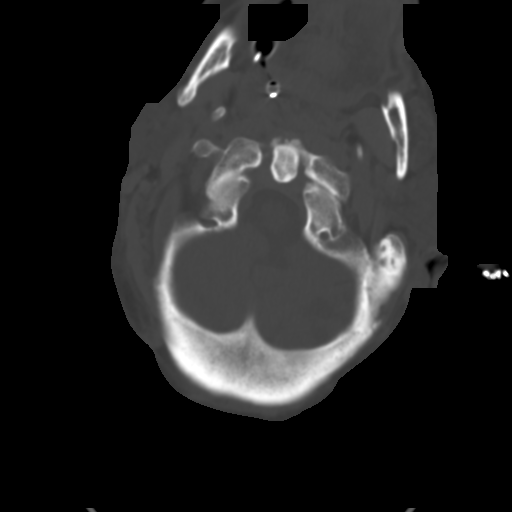
[im 10/37  brain]
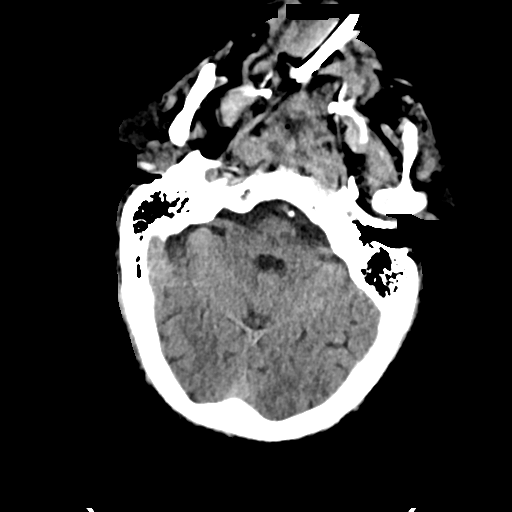
[im 14/37  brain]
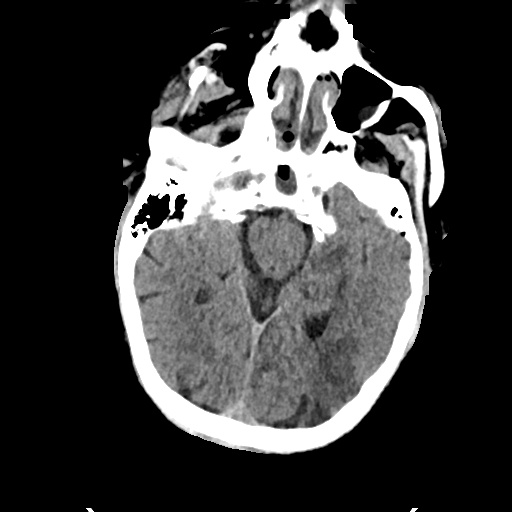
[im 19/37  brain]
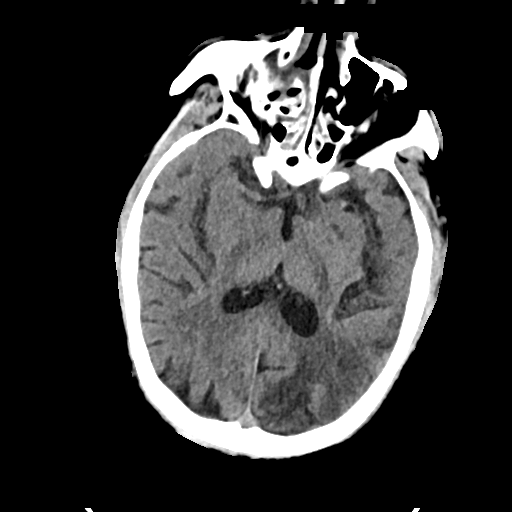
[im 23/37  brain]
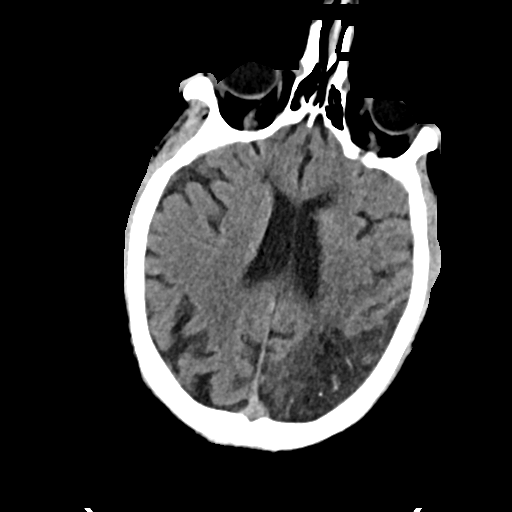
[im 23/37  bone]
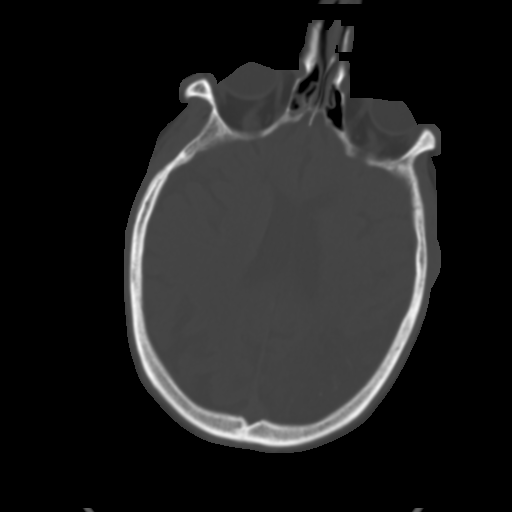
[im 28/37  brain]
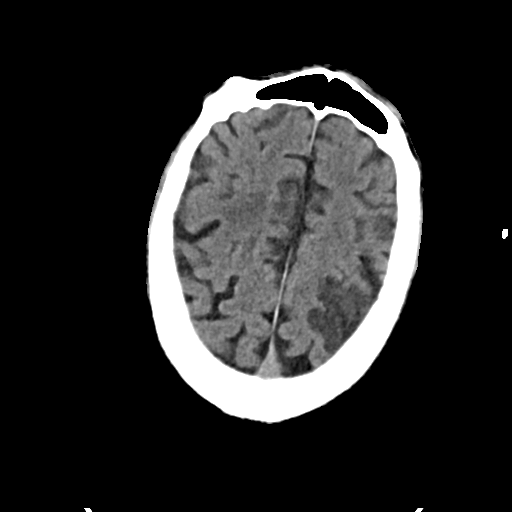
[im 32/37  brain]
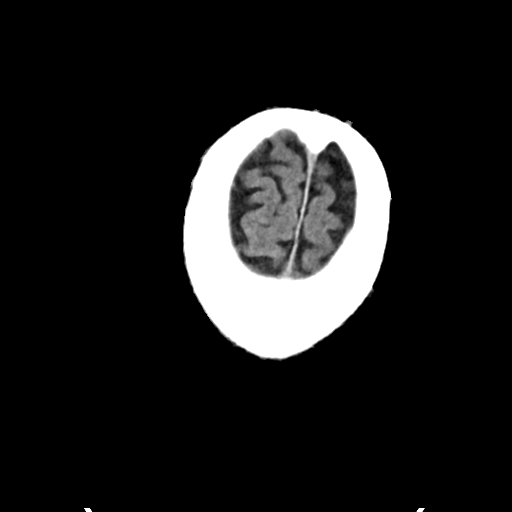

[Series 4: head bone · axial · 0.45mm/px · z∈[-158,-32]mm · 7 of 91 slices shown]
[im 10/91  bone]
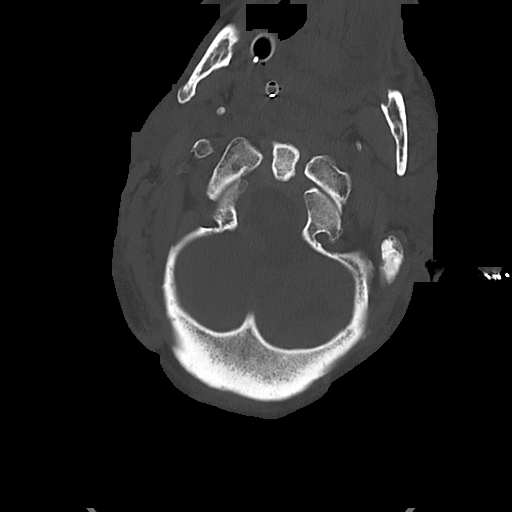
[im 19/91  bone]
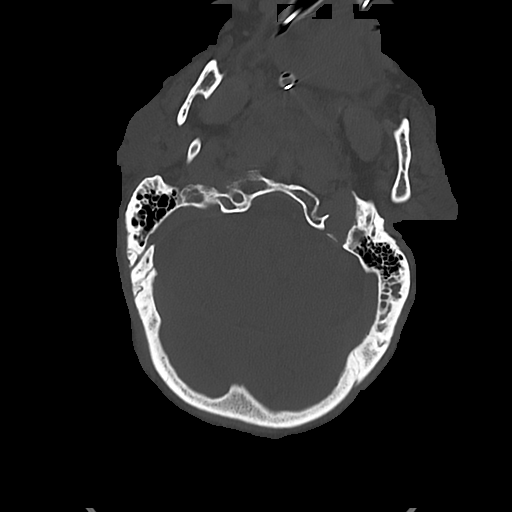
[im 28/91  bone]
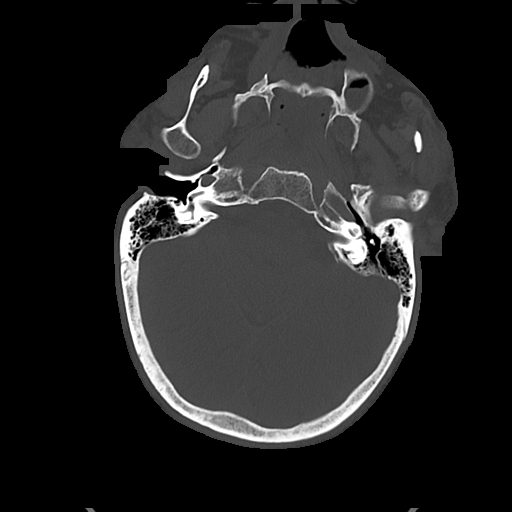
[im 41/91  bone]
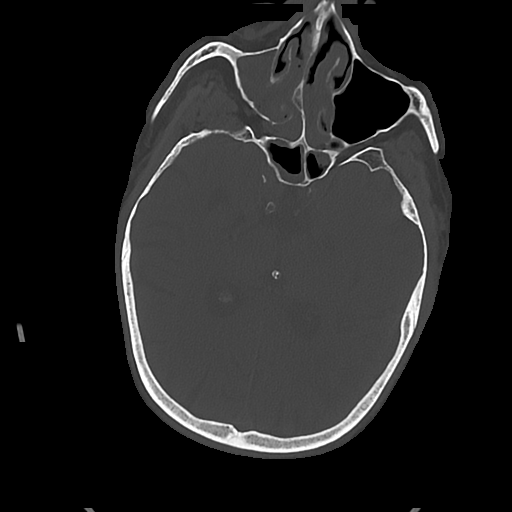
[im 50/91  bone]
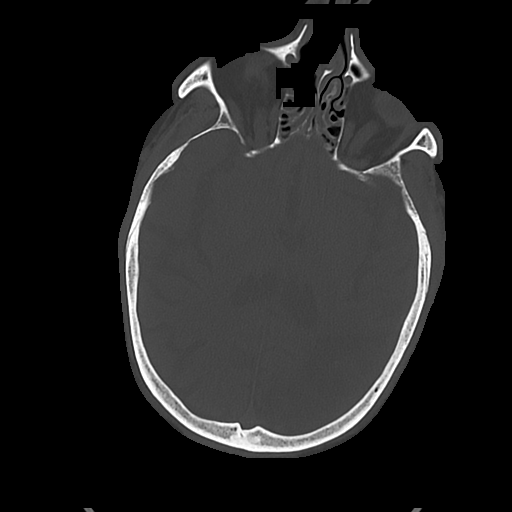
[im 64/91  bone]
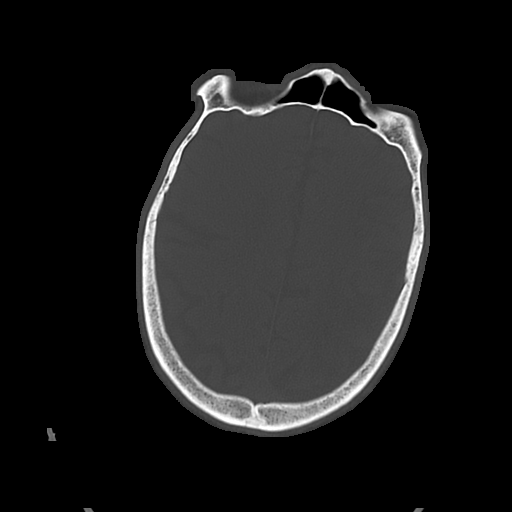
[im 73/91  bone]
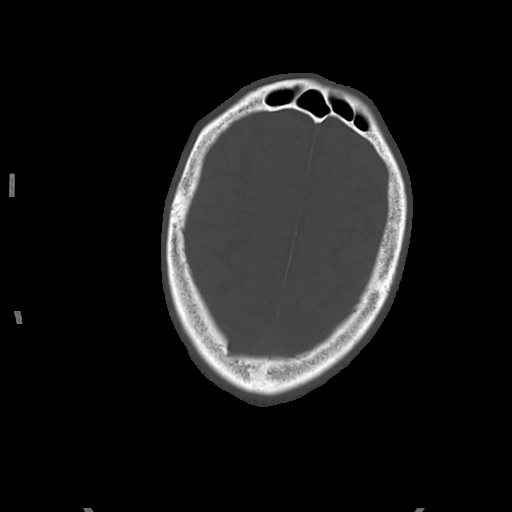

[Series 6: sag soft · sagittal · 0.45mm/px · 3 of 66 slices shown]
[im 22/66  brain]
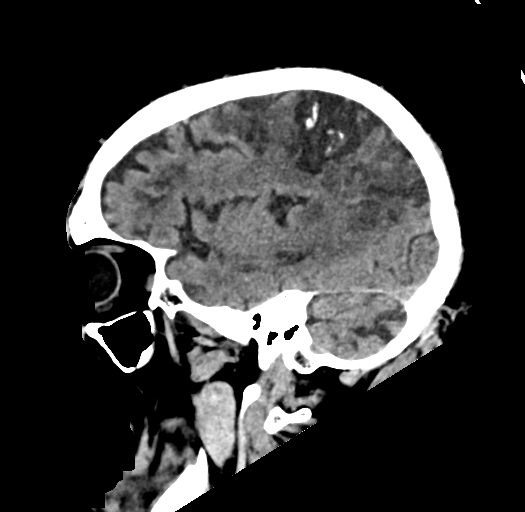
[im 33/66  brain]
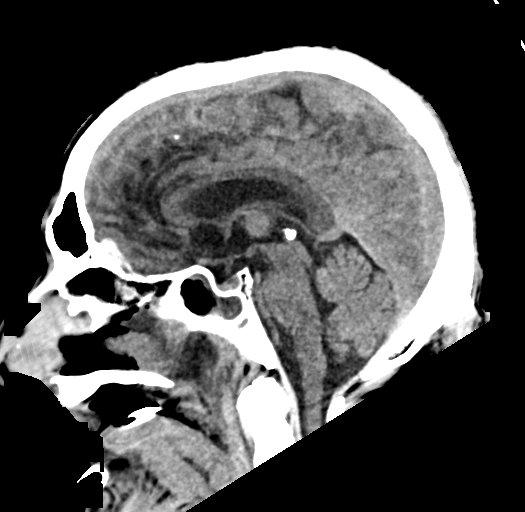
[im 44/66  brain]
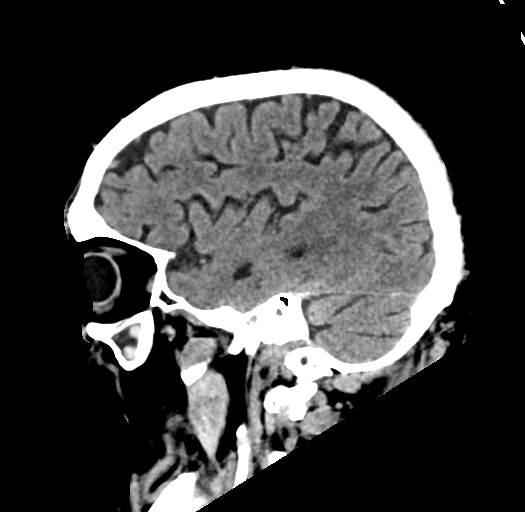

[17 of 37 positions shown; findings below may reference images not displayed]

FINDINGS: Brain: Subacute left MCA territory infarct with unchanged areas of
cortical hypoattenuation. Encephalomalacia in the left PCA territory
is unchanged. No acute hemorrhage or mass effect.

Vascular: No abnormal hyperdensity of the major intracranial
arteries or dural venous sinuses. No intracranial atherosclerosis.

Skull: The visualized skull base, calvarium and extracranial soft
tissues are normal.

Sinuses/Orbits: Opacification of the maxillary and sphenoid sinuses.
The orbits are normal.
IMPRESSION: Subacute left MCA territory infarct without acute hemorrhage or mass
effect.

## 2022-03-05 IMAGING — DX DG CHEST 1V PORT
2 series · 2 of 2 positions shown · non-contrast
Comparison: Radiograph [DATE]

CLINICAL DATA: Acute ischemic left MCA stroke.  Seizures.

EXAM:
PORTABLE CHEST 1 VIEW

[chest ap (1 of 2)]
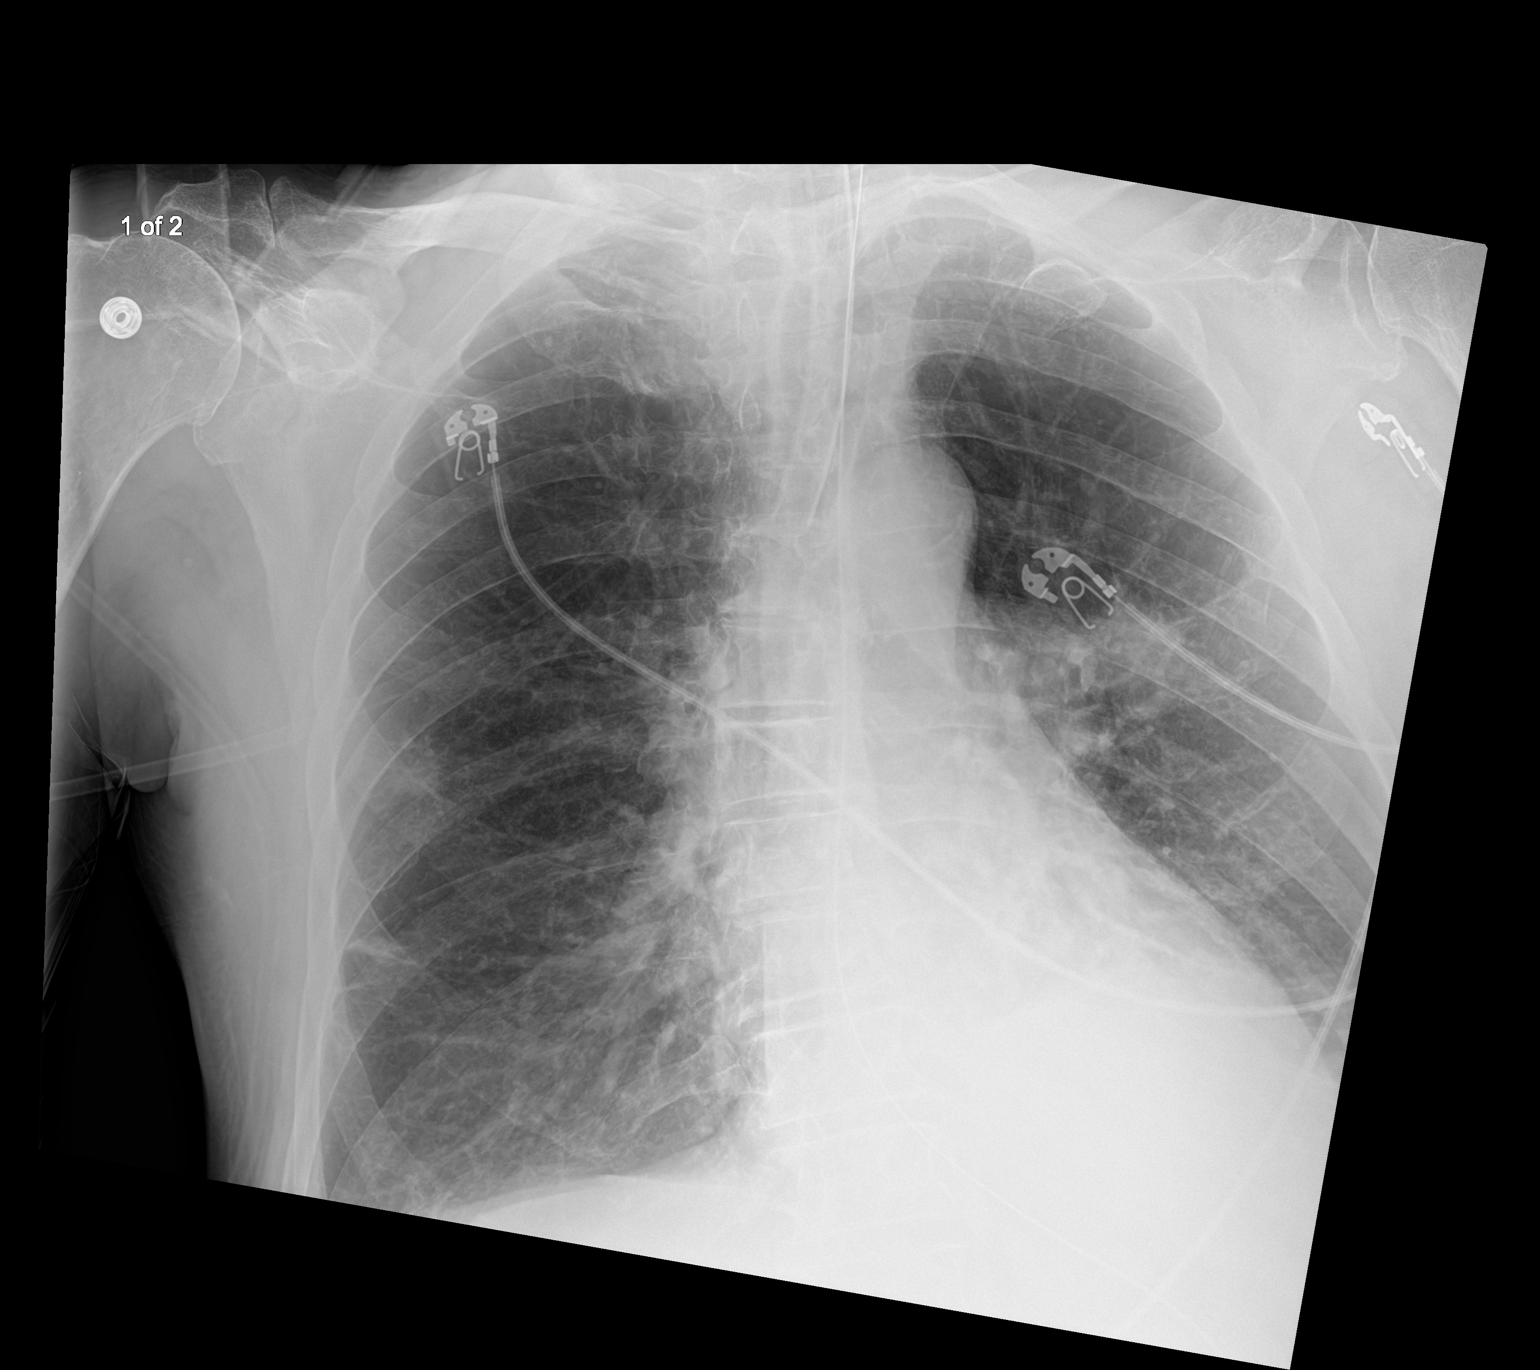

[chest ap (2 of 2)]
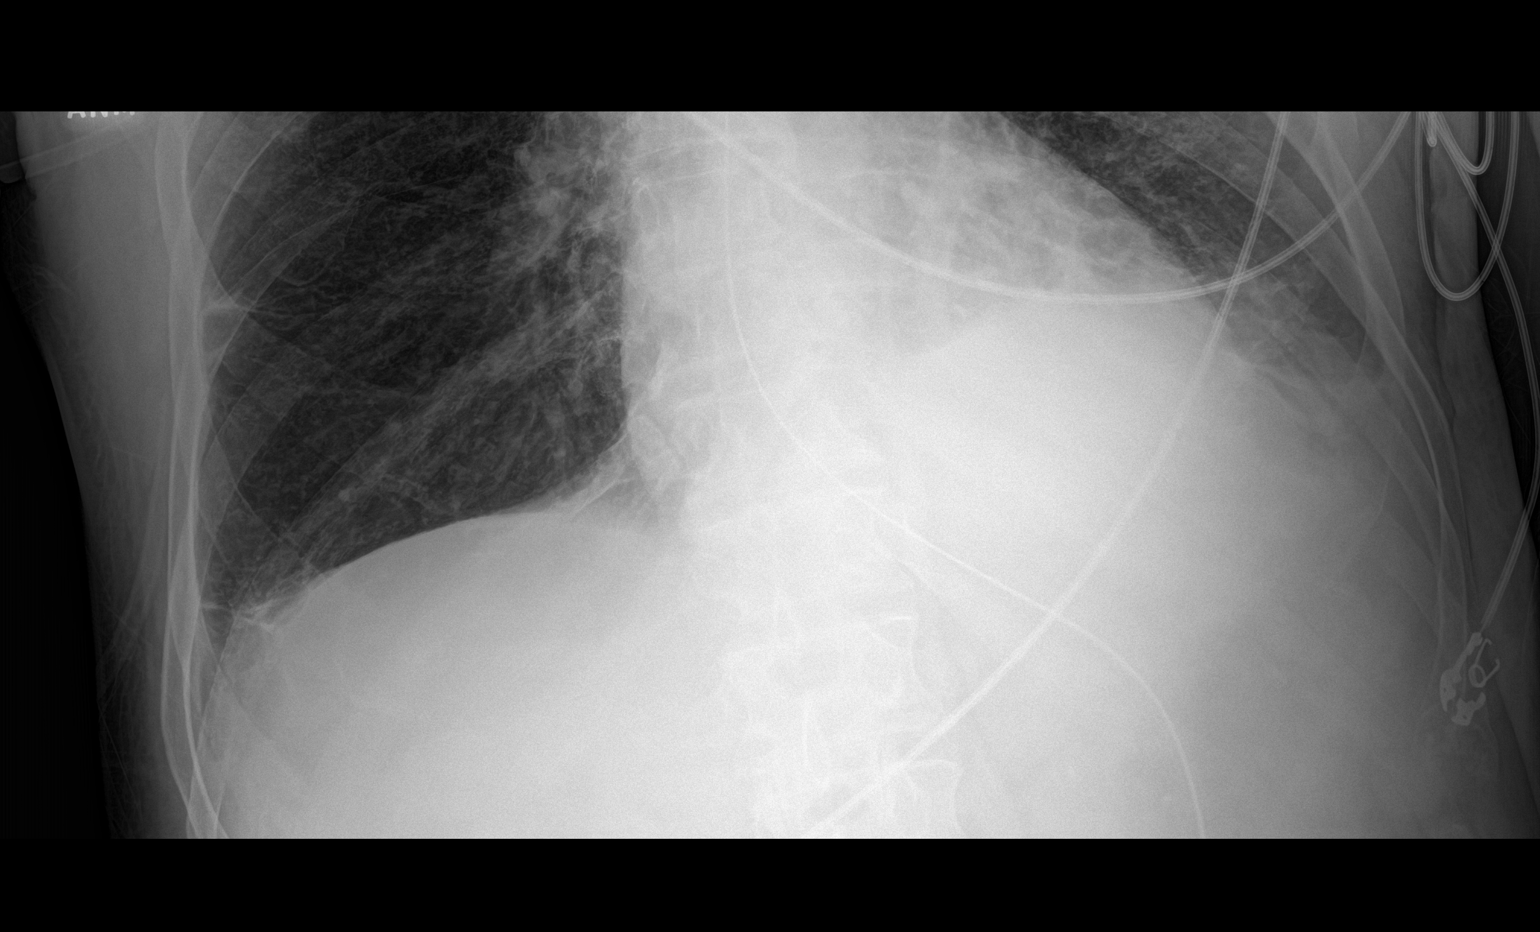

[2 of 2 positions shown; findings below may reference images not displayed]

FINDINGS: Endotracheal tube tip 2.4 cm from the carina. Enteric tube in place
with tip and side-port below the diaphragm. Persistent retrocardiac
opacity and blunting of left costophrenic angle, probable small left
effusion. There may be a trace right pleural effusion with linear
atelectasis at the right lung base. Persistent nodular density in
the right mid lung measuring approximately 16 mm. There is also a
rounded left perihilar opacity. The heart is normal in size.
Underlying emphysema.
IMPRESSION: 1. Endotracheal tube tip 2.4 cm from the carina. Enteric tube in
place with tip and side-port below the diaphragm.
2. Persistent retrocardiac opacity and blunting of left costophrenic
angle, probable small left effusion.
3. Rounded LEFT perihilar opacity may be adenopathy, airspace
disease or nodule/mass.
4. Focal nodular opacity in the RIGHT mid lung persists, suspicious
for pulmonary nodule. When patient is clinically able, recommend
chest CT (with IV contrast in the absence of contraindication) for
further assessment of potential malignancy.

## 2022-03-05 MED ORDER — FENTANYL CITRATE PF 50 MCG/ML IJ SOSY
PREFILLED_SYRINGE | INTRAMUSCULAR | Status: AC
  Administered 2022-03-05: 100 ug via INTRAVENOUS
  Filled 2022-03-05: qty 2

## 2022-03-05 MED ORDER — ROCURONIUM BROMIDE 50 MG/5ML IV SOLN: 100.0000 mg | Freq: Once | INTRAVENOUS | Status: AC

## 2022-03-05 MED ORDER — SODIUM CHLORIDE 3 % IN NEBU
4.0000 mL | INHALATION_SOLUTION | Freq: Two times a day (BID) | RESPIRATORY_TRACT | Status: AC
  Administered 2022-03-05 – 2022-03-07 (×6): 4 mL via RESPIRATORY_TRACT
  Filled 2022-03-05 (×6): qty 4

## 2022-03-05 MED ORDER — MIDAZOLAM HCL 2 MG/2ML IJ SOLN: 2.0000 mg | Freq: Once | INTRAMUSCULAR | Status: AC

## 2022-03-05 MED ORDER — FENTANYL CITRATE PF 50 MCG/ML IJ SOSY: 100.0000 ug | PREFILLED_SYRINGE | Freq: Once | INTRAMUSCULAR | Status: AC

## 2022-03-05 MED ORDER — GUAIFENESIN 100 MG/5ML PO LIQD
15.0000 mL | Freq: Four times a day (QID) | ORAL | Status: DC
  Administered 2022-03-05 (×3): 15 mL
  Filled 2022-03-05 (×3): qty 15

## 2022-03-05 MED ORDER — IPRATROPIUM-ALBUTEROL 0.5-2.5 (3) MG/3ML IN SOLN
3.0000 mL | Freq: Four times a day (QID) | RESPIRATORY_TRACT | Status: DC
  Administered 2022-03-05 (×2): 3 mL via RESPIRATORY_TRACT
  Filled 2022-03-05 (×2): qty 3

## 2022-03-05 MED ORDER — IPRATROPIUM-ALBUTEROL 0.5-2.5 (3) MG/3ML IN SOLN
3.0000 mL | Freq: Four times a day (QID) | RESPIRATORY_TRACT | Status: DC
  Administered 2022-03-06 – 2022-03-07 (×6): 3 mL via RESPIRATORY_TRACT
  Filled 2022-03-05 (×6): qty 3

## 2022-03-05 MED ORDER — ROCURONIUM BROMIDE 10 MG/ML (PF) SYRINGE
PREFILLED_SYRINGE | INTRAVENOUS | Status: AC
Start: 2022-03-05 — End: 2022-03-05
  Administered 2022-03-05: 100 mg via INTRAVENOUS
  Filled 2022-03-05: qty 10

## 2022-03-05 MED ORDER — MIDAZOLAM HCL 2 MG/2ML IJ SOLN
INTRAMUSCULAR | Status: AC
  Administered 2022-03-05: 2 mg via INTRAVENOUS
  Filled 2022-03-05: qty 2

## 2022-03-05 MED ORDER — ORAL CARE MOUTH RINSE
15.0000 mL | OROMUCOSAL | Status: DC
  Administered 2022-03-05 – 2022-03-06 (×7): 15 mL via OROMUCOSAL

## 2022-03-05 MED ORDER — CHLORHEXIDINE GLUCONATE 0.12% ORAL RINSE (MEDLINE KIT)
15.0000 mL | Freq: Two times a day (BID) | OROMUCOSAL | Status: DC
  Administered 2022-03-05: 15 mL via OROMUCOSAL

## 2022-03-05 MED ORDER — POLYETHYLENE GLYCOL 3350 17 G PO PACK: 17.0000 g | PACK | Freq: Every day | ORAL | Status: DC

## 2022-03-05 MED ORDER — ETOMIDATE 2 MG/ML IV SOLN: 20.0000 mg | Freq: Once | INTRAVENOUS | Status: AC

## 2022-03-05 MED ORDER — ETOMIDATE 2 MG/ML IV SOLN
INTRAVENOUS | Status: AC
  Administered 2022-03-05: 20 mg via INTRAVENOUS
  Filled 2022-03-05: qty 20

## 2022-03-05 MED ORDER — DOCUSATE SODIUM 50 MG/5ML PO LIQD
100.0000 mg | Freq: Two times a day (BID) | ORAL | Status: DC
  Administered 2022-03-05: 100 mg
  Filled 2022-03-05: qty 10

## 2022-03-05 NOTE — Procedures (Signed)
Extubation Procedure Note ? ?Patient Details:   ?Name: JAXON FLATT ?DOB: Sep 12, 1961 ?MRN: [id number] ?  ?Airway Documentation:  ?  ?Vent end date: 03/05/22 Vent end time: 1146  ? ?Evaluation ? O2 sats: currently acceptable ?Complications: Complications of very sleepy with snoring respirations. MD placed nasal airway which relieved obstruction ?Patient did tolerate procedure well. ?Bilateral Breath Sounds: Diminished ?  ?Patient extubated per MD order & placed on 4L [place]. Patient has a good cough. Very sleepy & very slow to follow commands. Currently in no distress. Will continue to monitor. ? ?Saxton, Chain ?03/05/2022, 11:58 AM ? ?

## 2022-03-05 NOTE — Progress Notes (Signed)
RT note: RT and RN transported vent patient to CT and back to 4N. Vital signs stable through out. ?

## 2022-03-05 NOTE — Progress Notes (Addendum)
STROKE TEAM PROGRESS NOTE  ? ?INTERVAL HISTORY ?Patient is seen in his room with no family at the bedside.  He has had a few transient episodes of hypotension overnight but is otherwise stable.  He is more alert today- will try SBT and hope to extubate. ? ?Vitals:  ? 03/05/22 0900 03/05/22 0930 03/05/22 1000 03/05/22 1046  ?BP: (!) 88/54 (!) 100/58 (!) 91/58   ?Pulse: 63 64 64   ?Resp: 13 19 20    ?Temp:      ?TempSrc:      ?SpO2: 99% 98% 94% 99%  ?Weight:      ?Height:      ? ?CBC:  ?Recent Labs  ?Lab 03/02/22 ?1813 03/02/22 ?1822 03/03/22 ?0430 03/03/22 ?1152 03/04/22 ?2549 03/05/22 ?0455  ?WBC 9.0   < > 9.7   < > 11.1* 7.6  ?NEUTROABS 7.0  --  6.9  --   --   --   ?HGB 17.3*   < > 15.2   < > 14.7 12.8*  ?HCT 52.2*   < > 45.5   < > 46.0 39.0  ?MCV 92.7   < > 91.5   < > 93.9 93.5  ?PLT 195   < > 166   < > 169 129*  ? < > = values in this interval not displayed.  ? ? ?Basic Metabolic Panel:  ?Recent Labs  ?Lab 03/04/22 ?8264 03/04/22 ?1857 03/05/22 ?0455  ?NA 140  --  141  ?K 4.3  --  3.7  ?CL 102  --  104  ?CO2 29  --  29  ?GLUCOSE 110*  --  98  ?BUN 8  --  14  ?CREATININE 0.74  --  0.76  ?CALCIUM 8.6*  --  8.5*  ?MG 2.6* 2.3  --   ?PHOS 4.3 3.9  --   ? ? ?Lipid Panel:  ?Recent Labs  ?Lab 03/03/22 ?0430  ?CHOL 115  ?TRIG 104  ?HDL 34*  ?CHOLHDL 3.4  ?VLDL 21  ?[place] 60  ? ? ?HgbA1c:  ?Recent Labs  ?Lab 03/03/22 ?0430  ?HGBA1C 5.4  ? ? ?Urine Drug Screen:  ?Recent Labs  ?Lab 03/03/22 ?1583  ?LABOPIA NONE DETECTED  ?COCAINSCRNUR NONE DETECTED  ?LABBENZ NONE DETECTED  ?AMPHETMU NONE DETECTED  ?THCU NONE DETECTED  ?LABBARB NONE DETECTED  ? ?  ?Alcohol Level  ?Recent Labs  ?Lab 03/02/22 ?1813  ?ETH <10  ? ? ? ?IMAGING past 24 hours ?DG Abd 1 View ? ?Result Date: 03/04/2022 ?CLINICAL DATA:  Stroke Pre MRI screening EXAM: ABDOMEN - 1 VIEW COMPARISON:  None. FINDINGS: No dilated loops of bowel to indicate ileus or obstruction. Extensive atherosclerotic arterial calcifications are noted. Mild degenerative changes present  lower lumbar spine. Enteric tube extensive left upper quadrant in the expected site of the stomach. No abnormal metallic densities are identified, however evaluation is limited due to multiple overlying external artifacts. IMPRESSION: No metallic density foreign body identified within the visualized abdomen. Electronically Signed   By: Miachel Roux M.D.   On: 03/04/2022 13:32  ? ?CT HEAD WO CONTRAST (5MM) ? ?Result Date: 03/05/2022 ?CLINICAL DATA:  Stroke follow-up EXAM: CT HEAD WITHOUT CONTRAST TECHNIQUE: Contiguous axial images were obtained from the base of the skull through the vertex without intravenous contrast. RADIATION DOSE REDUCTION: This exam was performed according to the departmental dose-optimization program which includes automated exposure control, adjustment of the mA and/or kV according to patient size and/or use of iterative reconstruction technique. COMPARISON:  MRI brain 03/04/2022  FINDINGS: Brain: Subacute left MCA territory infarct with unchanged areas of cortical hypoattenuation. Encephalomalacia in the left PCA territory is unchanged. No acute hemorrhage or mass effect. Vascular: No abnormal hyperdensity of the major intracranial arteries or dural venous sinuses. No intracranial atherosclerosis. Skull: The visualized skull base, calvarium and extracranial soft tissues are normal. Sinuses/Orbits: Opacification of the maxillary and sphenoid sinuses. The orbits are normal. IMPRESSION: Subacute left MCA territory infarct without acute hemorrhage or mass effect. Electronically Signed   By: Ulyses Jarred M.D.   On: 03/05/2022 01:03  ? ?MR ANGIO HEAD WO CONTRAST ? ?Addendum Date: 03/04/2022   ?ADDENDUM REPORT: 03/04/2022 17:39 ADDENDUM: Findings discussed with Dr. Leonel Ramsay via telephone at 5:35 p.m. Electronically Signed   By: Margaretha Sheffield M.D.   On: 03/04/2022 17:39  ? ?Result Date: 03/04/2022 ?CLINICAL DATA:  Neuro deficit, acute, stroke suspected; Stroke, follow up EXAM: MRI HEAD WITHOUT  CONTRAST MRA HEAD WITHOUT CONTRAST TECHNIQUE: Multiplanar, multi-echo pulse sequences of the brain and surrounding structures were acquired without intravenous contrast. Angiographic images of the Circle of Willis were acquired using MRA technique without intravenous contrast. COMPARISON:  No pertinent prior exam. FINDINGS: MRI HEAD FINDINGS Brain: Acute left MCA territory infarcts predominantly involving the left frontal, parietal and temporal lobes. Infarcts involve both cortex and white matter. Small acute infarct involving the left insula. Associated edema and sulcal effacement without midline shift. Areas of susceptibility artifact and T2 hypointensity. Areas of T2 hypointensity, T1 hyperintensity and susceptibility artifact is concerning for acute hemorrhage although an element prior hemorrhage/cortical laminar necrosis is possible posteriorly. No hydrocephalus. Vascular: Major arterial flow voids are maintained at the skull base. Skull and upper cervical spine: Normal marrow signal. Sinuses/Orbits: Paranasal sinus mucosal thickening. No acute orbital findings. Other: Small left greater than right mastoid effusions. MRA HEAD FINDINGS Anterior circulation: The intracranial ICAs are now patent. Left ICAs attenuated, likely due to pre-existing atherosclerosis with probably moderate to severe paraclinoid ICA stenosis. Bilateral MCAs and ACAs are patent without proximal hemodynamically significant stenosis. Posterior circulation: Chronically occluded left intradural vertebral artery. Right intradural vertebral artery, basilar artery and posterior cerebral arteries are patent without proximal hemodynamically significant stenosis. Right fetal type PCA, anatomic variant. Anatomic variants: As detailed above. IMPRESSION: MRI: 1. Acute left MCA territory infarcts predominantly involving the left frontal, parietal and temporal lobes. Associated edema and sulcal effacement without midline shift. 2. Findings concerning for  associated acute hemorrhage in areas of infarct. A noncontrast head CT could confirm and better evaluate extent if clinically warranted. MRA: 1. Intracranial ICAs are now patent after thrombectomy. Probably moderate to severe left paraclinoid ICA stenosis is likely due to pre-existing atherosclerosis. 2. Chronically occluded left vertebral artery. These results will be called to the ordering clinician or representative by the Radiologist Assistant, and communication documented in the PACS or [employer]. Electronically Signed: By: Margaretha Sheffield M.D. On: 03/04/2022 17:09  ? ?MR BRAIN WO CONTRAST ? ?Addendum Date: 03/04/2022   ?ADDENDUM REPORT: 03/04/2022 17:39 ADDENDUM: Findings discussed with Dr. Leonel Ramsay via telephone at 5:35 p.m. Electronically Signed   By: Margaretha Sheffield M.D.   On: 03/04/2022 17:39  ? ?Result Date: 03/04/2022 ?CLINICAL DATA:  Neuro deficit, acute, stroke suspected; Stroke, follow up EXAM: MRI HEAD WITHOUT CONTRAST MRA HEAD WITHOUT CONTRAST TECHNIQUE: Multiplanar, multi-echo pulse sequences of the brain and surrounding structures were acquired without intravenous contrast. Angiographic images of the Circle of Willis were acquired using MRA technique without intravenous contrast. COMPARISON:  No pertinent prior exam. FINDINGS: MRI  HEAD FINDINGS Brain: Acute left MCA territory infarcts predominantly involving the left frontal, parietal and temporal lobes. Infarcts involve both cortex and white matter. Small acute infarct involving the left insula. Associated edema and sulcal effacement without midline shift. Areas of susceptibility artifact and T2 hypointensity. Areas of T2 hypointensity, T1 hyperintensity and susceptibility artifact is concerning for acute hemorrhage although an element prior hemorrhage/cortical laminar necrosis is possible posteriorly. No hydrocephalus. Vascular: Major arterial flow voids are maintained at the skull base. Skull and upper cervical spine: Normal marrow  signal. Sinuses/Orbits: Paranasal sinus mucosal thickening. No acute orbital findings. Other: Small left greater than right mastoid effusions. MRA HEAD FINDINGS Anterior circulation: The intracranial ICAs

## 2022-03-05 NOTE — Progress Notes (Signed)
? ?NAME:  Todd Estrada, MRN:  [id number], DOB:  22-Jan-1961, LOS: 3 ?ADMISSION DATE:  03/02/2022, CONSULTATION DATE:  3/16 ?REFERRING MD:  Erlinda Hong, CHIEF COMPLAINT:  Confusion, seizure  ? ?History of Present Illness:  ?61 y/o male with a history of Left MCA stroke in 2395 with a complicated post stroke course presented on 3/15 with a 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  Intubated on 3/16 for airway protection. ? ?Pertinent  Medical History  ?Hyeprtension ?Left MCA stroke ?Chronic combined CHF ? ?Significant Hospital Events: ?Including procedures, antibiotic start and stop dates in addition to other pertinent events   ?3/15 admitted for acute L MCA stroke due to L ICA occlusion with first time seizure s/p revascularization of L ICA occlusion ?3/16 Intubated for airway protection; started unasyn ?3/17 MRI Brain showed acute infarct in left frontal, temporal and parietal lobes with findings worrisome for acute infarct, left vert occluded; CT head did not show hemorrhage; respiratory notes thick secretions,  ?3/18 following some commands on exam ? ?Interim History / Subjective:  ?MRI Brain showed acute infarct in left frontal, temporal and parietal lobes with findings worrisome for acute infarct, left vert occluded; CT head did not show hemorrhage; respiratory notes thick secretions,  ?following some commands on exam ? ?Objective   ?Blood pressure 122/71, pulse (!) 57, temperature 98.9 ?F (37.2 ?C), temperature source Oral, resp. rate 20, height 6' (1.829 m), weight 84.7 kg, SpO2 94 %. ?   ?Vent Mode: PRVC ?FiO2 (%):  [40 %-100 %] 80 % ?Set Rate:  [20 bmp] 20 bmp ?Vt Set:  [620 mL] 620 mL ?PEEP:  [5 cmH20] 5 cmH20 ?Plateau Pressure:  [12 cmH20-13 cmH20] 12 cmH20  ? ?Intake/Output Summary (Last 24 hours) at 03/05/2022 0752 ?Last data filed at 03/04/2022 2000 ?Gross per 24 hour  ?Intake 1292.38 ml  ?Output 225 ml  ?Net 1067.38 ml  ? ?Filed Weights  ? 03/02/22 2000 03/03/22 0339 03/03/22 1000   ?Weight: 87.9 kg 84.7 kg 84.7 kg  ? ? ?Examination: ? ?General:  In bed on vent ?HENT: NCAT ETT in place ?PULM: CTA B, vent supported breathing ?CV: RRR, no mgr ?GI: BS+, soft, nontender ?MSK: normal bulk and tone ?Neuro: awake on vent, follows commands ? ?Resolved Hospital Problem list   ? ? ?Assessment & Plan:  ?Acute encephalopathy due to left MCA stroke from left ICA occlusion requiring thrombectomy and stent placement on 3/15 ?Seizure disorder ?Keppra to continue ?Asa, statin, secondary stroke prevention measures per stroke service ?BP and glucose goals per stroke service ? ?Acute hypoxemic respiratory failure due to aspiration pneumonia ?Thick pulmonary secretions ?Concern for emphysema/COPD > not in history but exam supports ?Plan unasyn 5 days ?Add hypertonic saline nebs and guaifenesin ?VAP prevention ?Daily WUA/SBT ?SBT on 3/18, likely extubation ?Add duoneb ? ?Septic shock due to aspiration pneumonia> resolved ?Monitor off levophed ? ?Prior EtOH abuse, concern for alcohol withdrawal contributing to delirium ?Need for sedation for mechanical ventilation ?Hold precedex ?Use prn versed ? ?Combined systolic and diastolic heart failure, chronic ?Hypertension ?Monitor hemodynamics ? ?Best Practice (right click and "Reselect all SmartList Selections" daily)  ? ?Diet/type: tubefeeds ?DVT prophylaxis: LMWH ?GI prophylaxis: PPI ?Lines: N/A ?Foley:  N/A ?Code Status:  full code ?Last date of multidisciplinary goals of care discussion [per stroke service] ? ?Labs   ?CBC: ?Recent Labs  ?Lab 03/02/22 ?1813 03/02/22 ?1822 03/03/22 ?0101 03/03/22 ?0430 03/03/22 ?1152 03/03/22 ?1410 03/03/22 ?1423 03/04/22 ?3202 03/05/22 ?0455  ?WBC 9.0  --  8.2 9.7  --  8.1  --  11.1* 7.6  ?NEUTROABS 7.0  --   --  6.9  --   --   --   --   --   ?HGB 17.3*   < > 14.4 15.2 14.6 13.8 13.9 14.7 12.8*  ?HCT 52.2*   < > 44.6 45.5 43.0 43.0 41.0 46.0 39.0  ?MCV 92.7  --  93.9 91.5  --  93.9  --  93.9 93.5  ?PLT 195  --  158 166  --  132*  --   169 129*  ? < > = values in this interval not displayed.  ? ? ?Basic Metabolic Panel: ?Recent Labs  ?Lab 03/02/22 ?1813 03/02/22 ?1822 03/03/22 ?0101 03/03/22 ?0430 03/03/22 ?1152 03/03/22 ?1410 03/03/22 ?1423 03/03/22 ?1638 03/04/22 ?6967 03/04/22 ?1857 03/05/22 ?0455  ?NA 135 138  --  136 138 140 141  --  140  --  141  ?K 4.5 4.6  --  3.5 3.6 3.1* 3.0*  --  4.3  --  3.7  ?CL 101 102  --  101  --  103  --   --  102  --  104  ?CO2 22  --   --  26  --  28  --   --  29  --  29  ?GLUCOSE 97 99  --  103*  --  144*  --   --  110*  --  98  ?BUN 11 13  --  6*  --  6*  --   --  8  --  14  ?CREATININE 0.99 0.90 0.74 0.75  --  0.78  --   --  0.74  --  0.76  ?CALCIUM 8.9  --   --  8.5*  --  9.1  --   --  8.6*  --  8.5*  ?MG  --   --   --   --   --  1.4*  --  1.7 2.6* 2.3  --   ?PHOS  --   --   --   --   --  4.4  --  4.0 4.3 3.9  --   ? ?GFR: ?Estimated Creatinine Clearance: 106.4 mL/min (by C-G formula based on SCr of 0.76 mg/dL). ?Recent Labs  ?Lab 03/03/22 ?0430 03/03/22 ?1410 03/04/22 ?8938 03/05/22 ?0455  ?WBC 9.7 8.1 11.1* 7.6  ?LATICACIDVEN  --  1.4  --   --   ? ? ?Liver Function Tests: ?Recent Labs  ?Lab 03/02/22 ?1813  ?AST 31  ?ALT 22  ?ALKPHOS 58  ?BILITOT 1.4*  ?PROT 6.3*  ?ALBUMIN 3.6  ? ?No results for input(s): LIPASE, AMYLASE in the last 168 hours. ?No results for input(s): AMMONIA in the last 168 hours. ? ?ABG ?   ?Component Value Date/Time  ? PHART 7.318 (L) 03/03/2022 1423  ? PCO2ART 60.5 (H) 03/03/2022 1423  ? PO2ART 193 (H) 03/03/2022 1423  ? HCO3 31.1 (H) 03/03/2022 1423  ? TCO2 33 (H) 03/03/2022 1423  ? O2SAT 100 03/03/2022 1423  ?  ? ?Coagulation Profile: ?No results for input(s): INR, PROTIME in the last 168 hours. ? ?Cardiac Enzymes: ?No results for input(s): CKTOTAL, CKMB, CKMBINDEX, TROPONINI in the last 168 hours. ? ?HbA1C: ?Hgb A1c MFr Bld  ?Date/Time Value Ref Range Status  ?03/03/2022 04:30 AM 5.4 4.8 - 5.6 % Final  ?  Comment:  ?  (NOTE) ?        Prediabetes: 5.7 - 6.4 ?  Diabetes: >6.4 ?         Glycemic control for adults with diabetes: <7.0 ?  ?08/19/2021 07:49 AM 5.5 4.8 - 5.6 % Final  ?  Comment:  ?  (NOTE) ?Pre diabetes:          5.7%-6.4% ? ?Diabetes:              >6.4% ? ?Glycemic control for   <7.0% ?adults with diabetes ?  ? ? ?CBG: ?Recent Labs  ?Lab 03/04/22 ?1733 03/04/22 ?1938 03/04/22 ?2312 03/05/22 ?4801 03/05/22 ?6553  ?GLUCAP 113* 110* 112* 96 83  ? ? ?  ? ?Critical care time: 32 minutes ?  ? ?Roselie Awkward, MD ?[facility] ?Pager: [phone] ?Cell: [phone] ?After 7:00 pm call Elink  [phone] ? ? ? ? ?

## 2022-03-05 NOTE — Procedures (Signed)
Intubation Procedure Note ?SUHAAS AGENA ?[id number] ?1961-01-10 ? ?Procedure: Intubation ?Indications: Airway protection and maintenance ? ?Procedure Details ?Consent: Unable to obtain consent because of altered level of consciousness. ?Time Out: Verified patient identification, verified procedure, site/side was marked, verified correct patient position, special equipment/implants available, medications/allergies/relevent history reviewed, required imaging and test results available.  Performed ? ?Drugs Versed 2mg  IV, Fentanyl 16mcg IV, Etomidate 20mg  IV, Rocuronium 100mg  IV ?DL x 1 with GS 3 blade ?Grade 1 view ?7.5 ET tube passed through cords under direct visualization ?Placement confirmed with bilateral breath sounds, positive EtCO2 change and smoke in tube ? ? ?Evaluation ?Hemodynamic Status: BP stable throughout; O2 sats: stable throughout ?Patient's Current Condition: stable ?Complications: No apparent complications ?Patient did tolerate procedure well. ?Chest X-ray ordered to verify placement.  CXR: pending. ? ? ?Roselie Awkward ?03/05/2022 ? ?

## 2022-03-06 ENCOUNTER — Inpatient Hospital Stay (HOSPITAL_COMMUNITY): Payer: Medicaid Other

## 2022-03-06 DIAGNOSIS — I63512 Cerebral infarction due to unspecified occlusion or stenosis of left middle cerebral artery: Secondary | ICD-10-CM | POA: Diagnosis not present

## 2022-03-06 DIAGNOSIS — E440 Moderate protein-calorie malnutrition: Secondary | ICD-10-CM | POA: Diagnosis not present

## 2022-03-06 DIAGNOSIS — Z978 Presence of other specified devices: Secondary | ICD-10-CM | POA: Diagnosis not present

## 2022-03-06 DIAGNOSIS — R569 Unspecified convulsions: Secondary | ICD-10-CM | POA: Diagnosis not present

## 2022-03-06 LAB — BASIC METABOLIC PANEL
Anion gap: 8 (ref 5–15)
BUN: 13 mg/dL (ref 8–23)
CO2: 29 mmol/L (ref 22–32)
Calcium: 8.5 mg/dL — ABNORMAL LOW (ref 8.9–10.3)
Chloride: 104 mmol/L (ref 98–111)
Creatinine, Ser: 0.78 mg/dL (ref 0.61–1.24)
GFR, Estimated: >60 mL/min (ref >60)
Glucose, Bld: 114 mg/dL — ABNORMAL HIGH (ref 70–99)
Potassium: 4 mmol/L (ref 3.5–5.1)
Sodium: 141 mmol/L (ref 135–145)

## 2022-03-06 LAB — CBC
HCT: 40 % (ref 39.0–52.0)
Hemoglobin: 12.9 g/dL — ABNORMAL LOW (ref 13.0–17.0)
MCH: 30.9 pg (ref 26.0–34.0)
MCHC: 32.3 g/dL (ref 30.0–36.0)
MCV: 95.9 fL (ref 80.0–100.0)
Platelets: 143 10*3/uL — ABNORMAL LOW (ref 150–400)
RBC: 4.17 MIL/uL — ABNORMAL LOW (ref 4.22–5.81)
RDW: 13.6 % (ref 11.5–15.5)
WBC: 7.8 10*3/uL (ref 4.0–10.5)
nRBC: 0 % (ref 0.0–0.2)

## 2022-03-06 LAB — GLUCOSE, CAPILLARY
Glucose-Capillary: 142 mg/dL — ABNORMAL HIGH (ref 70–99)
Glucose-Capillary: 136 mg/dL — ABNORMAL HIGH (ref 70–99)
Glucose-Capillary: 140 mg/dL — ABNORMAL HIGH (ref 70–99)
Glucose-Capillary: 118 mg/dL — ABNORMAL HIGH (ref 70–99)
Glucose-Capillary: 136 mg/dL — ABNORMAL HIGH (ref 70–99)
Glucose-Capillary: 136 mg/dL — ABNORMAL HIGH (ref 70–99)

## 2022-03-06 LAB — POCT I-STAT 7, (LYTES, BLD GAS, ICA,H+H)
Acid-Base Excess: 6 mmol/L — ABNORMAL HIGH (ref 0.0–2.0)
Bicarbonate: 33 mmol/L — ABNORMAL HIGH (ref 20.0–28.0)
Calcium, Ion: 1.25 mmol/L (ref 1.15–1.40)
HCT: 38 % — ABNORMAL LOW (ref 39.0–52.0)
Hemoglobin: 12.9 g/dL — ABNORMAL LOW (ref 13.0–17.0)
O2 Saturation: 99 %
Patient temperature: 98.1
Potassium: 4.4 mmol/L (ref 3.5–5.1)
Sodium: 142 mmol/L (ref 135–145)
TCO2: 35 mmol/L — ABNORMAL HIGH (ref 22–32)
pCO2 arterial: 58.4 mmHg — ABNORMAL HIGH (ref 32–48)
pH, Arterial: 7.359 (ref 7.35–7.45)
pO2, Arterial: 168 mmHg — ABNORMAL HIGH (ref 83–108)

## 2022-03-06 IMAGING — DX DG ABD PORTABLE 1V
1 series · 1 of 1 positions shown · non-contrast
Comparison: [DATE]

CLINICAL DATA: Feeding tube placement

EXAM:
PORTABLE ABDOMEN - 1 VIEW

[abdomen]
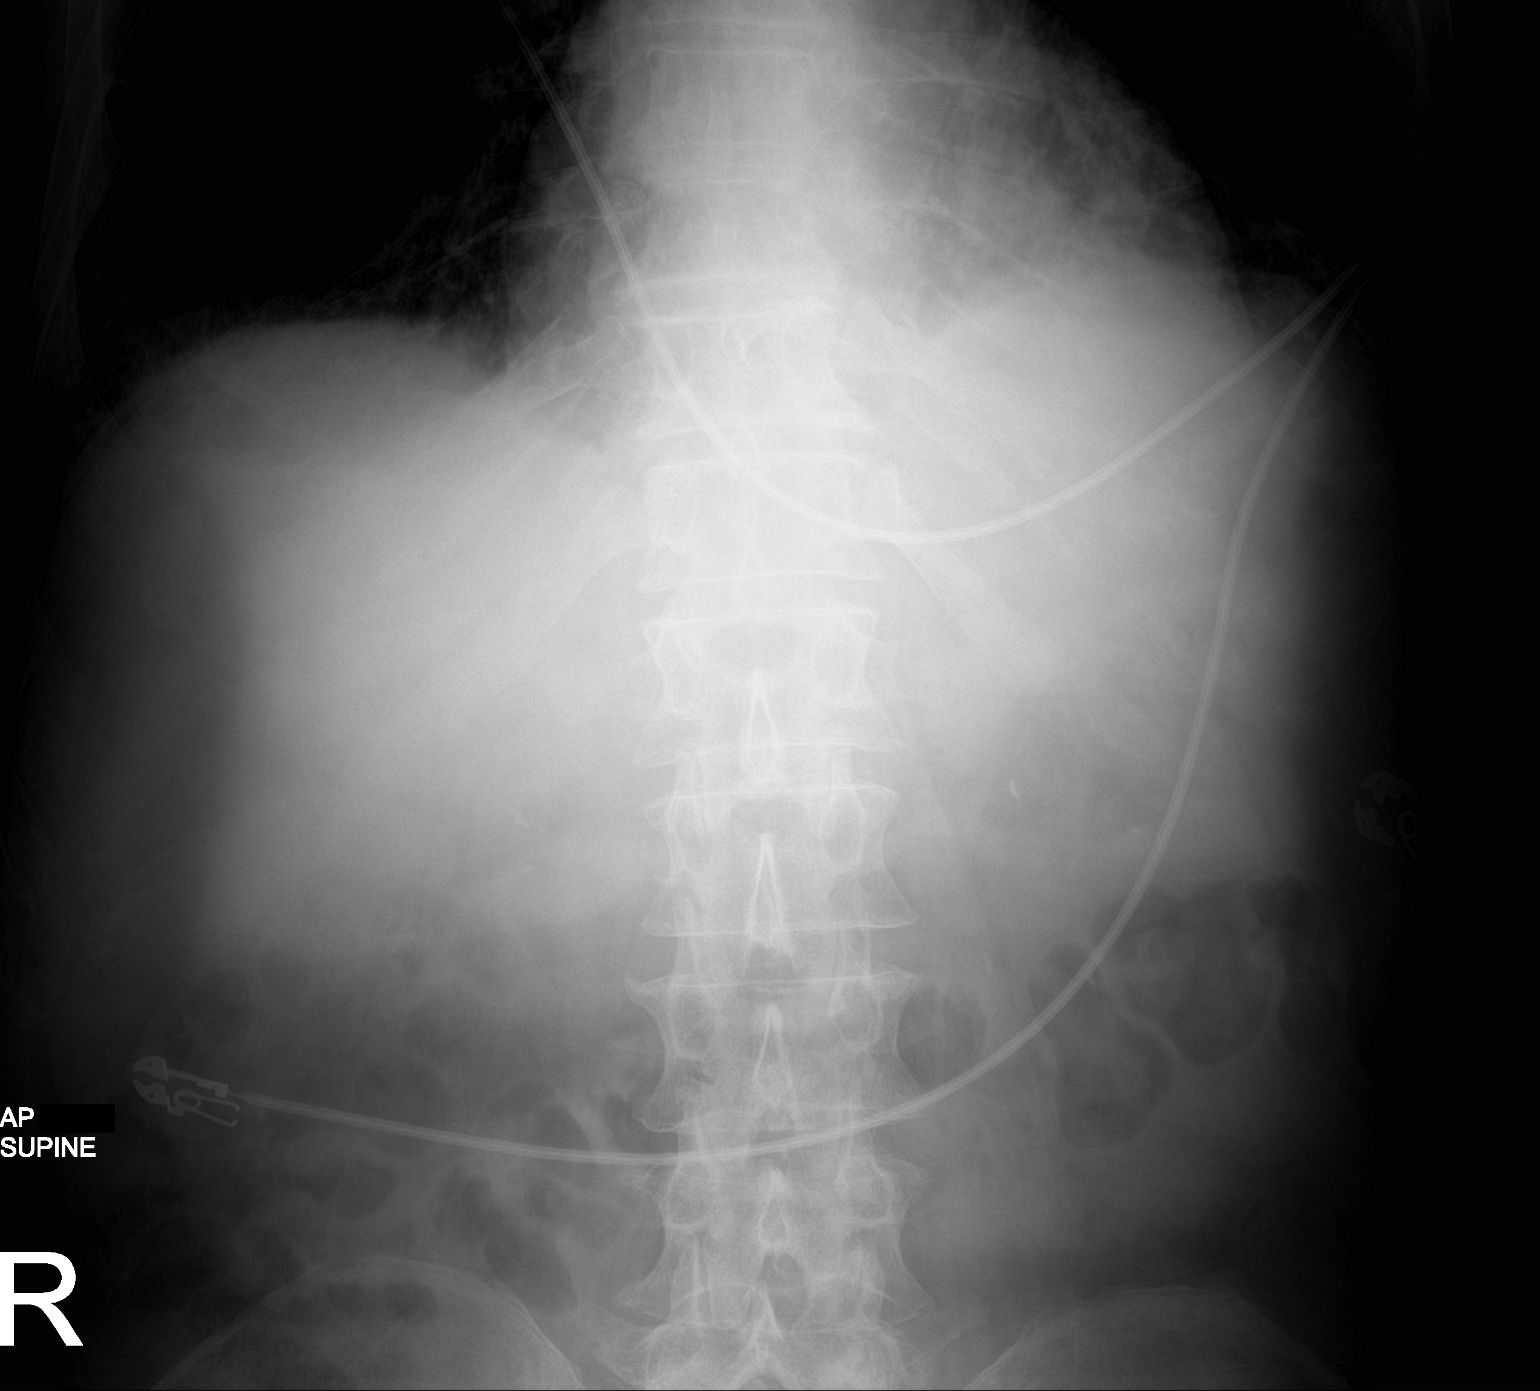

[1 of 1 positions shown; findings below may reference images not displayed]

FINDINGS: An EKG lead overlies the upper abdomen.  No feeding tube identified.
IMPRESSION: No feeding tube identified.

## 2022-03-06 IMAGING — DX DG CHEST 1V PORT
1 series · 3 of 3 positions shown · non-contrast
Comparison: [DATE]

CLINICAL DATA: Feeding tube placement

EXAM:
PORTABLE CHEST 1 VIEW

[Series 1: chest · 0.14mm/px · 3 of 3 slices shown]
[im 1/3]
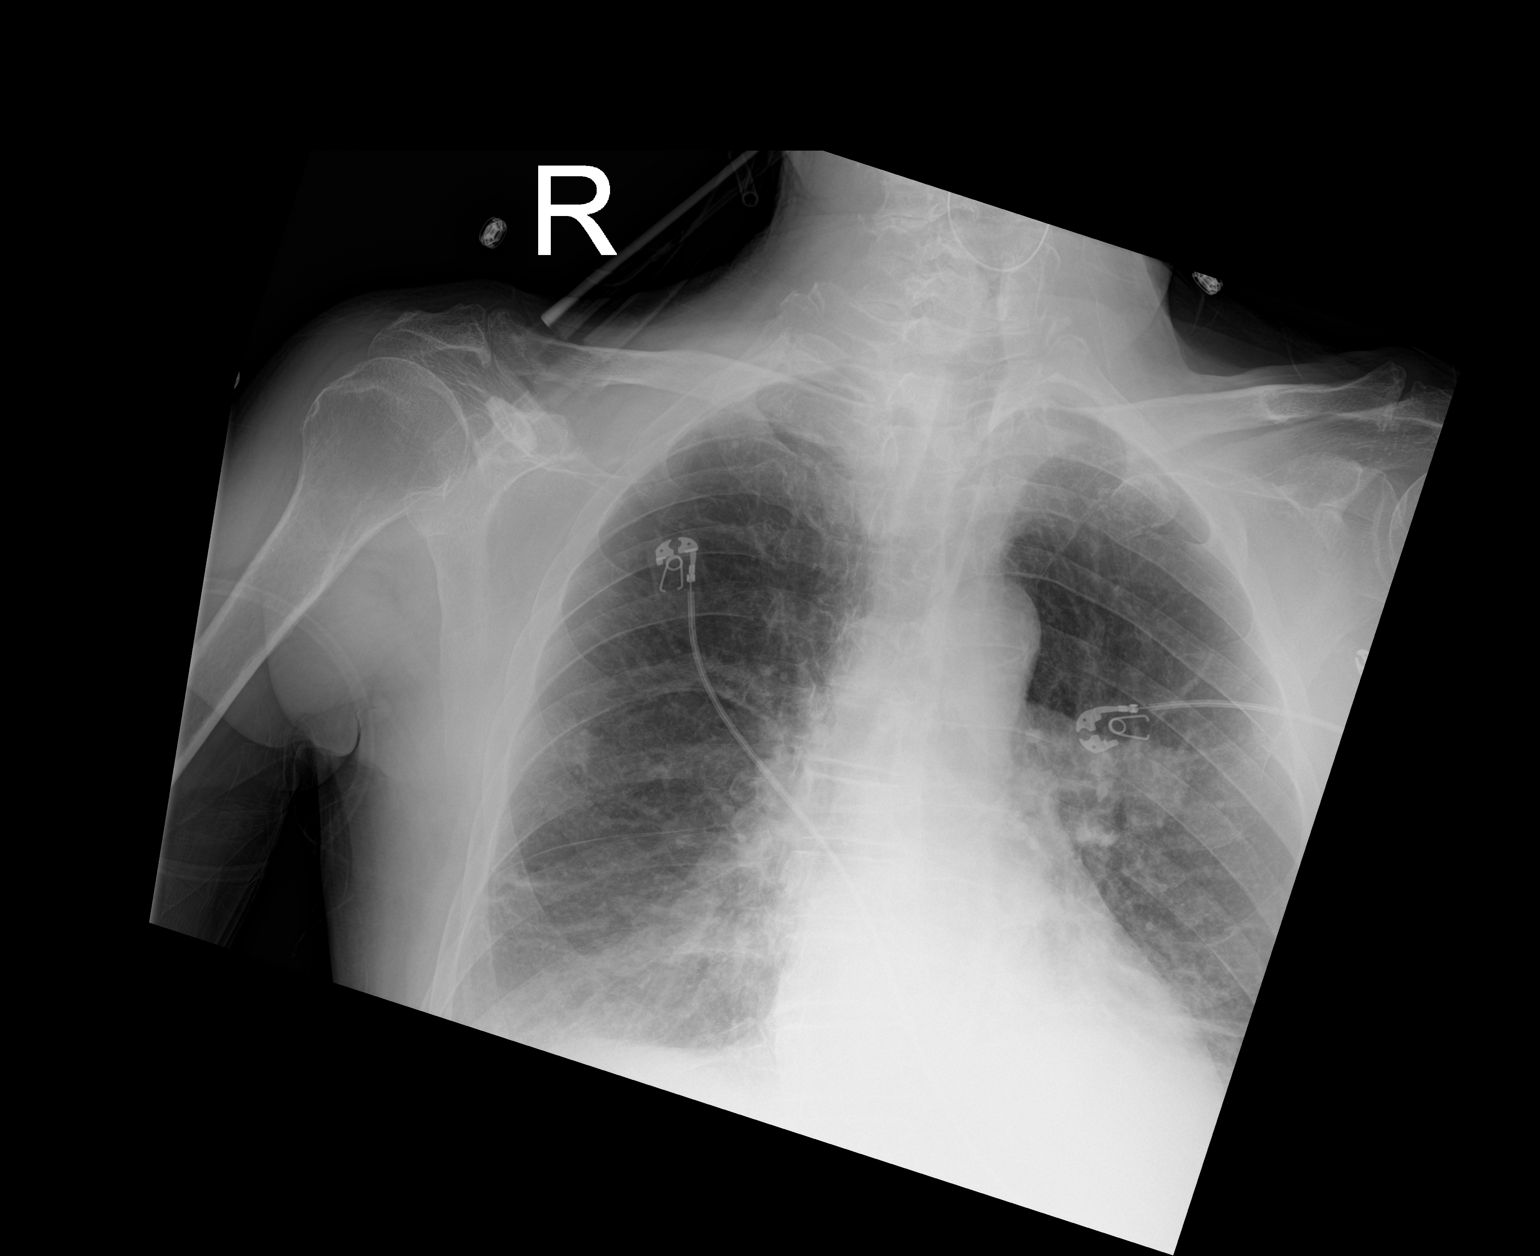
[im 2/3]
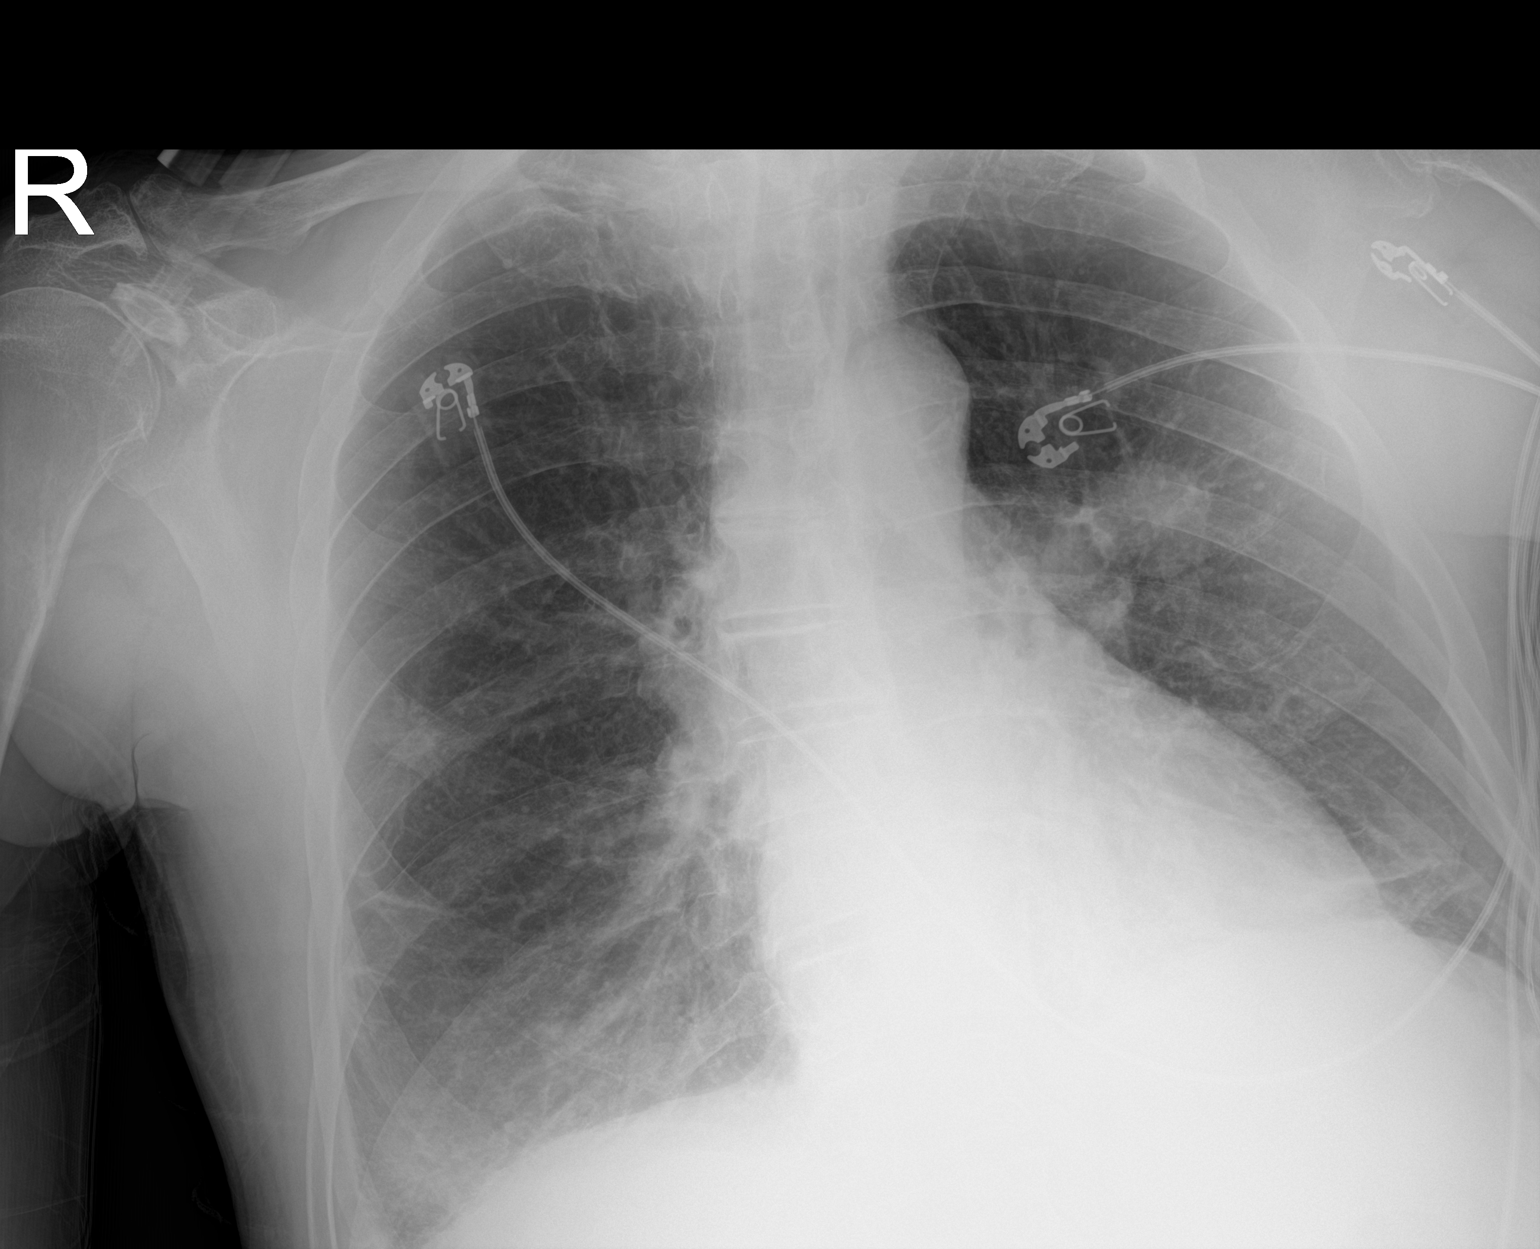
[im 3/3]
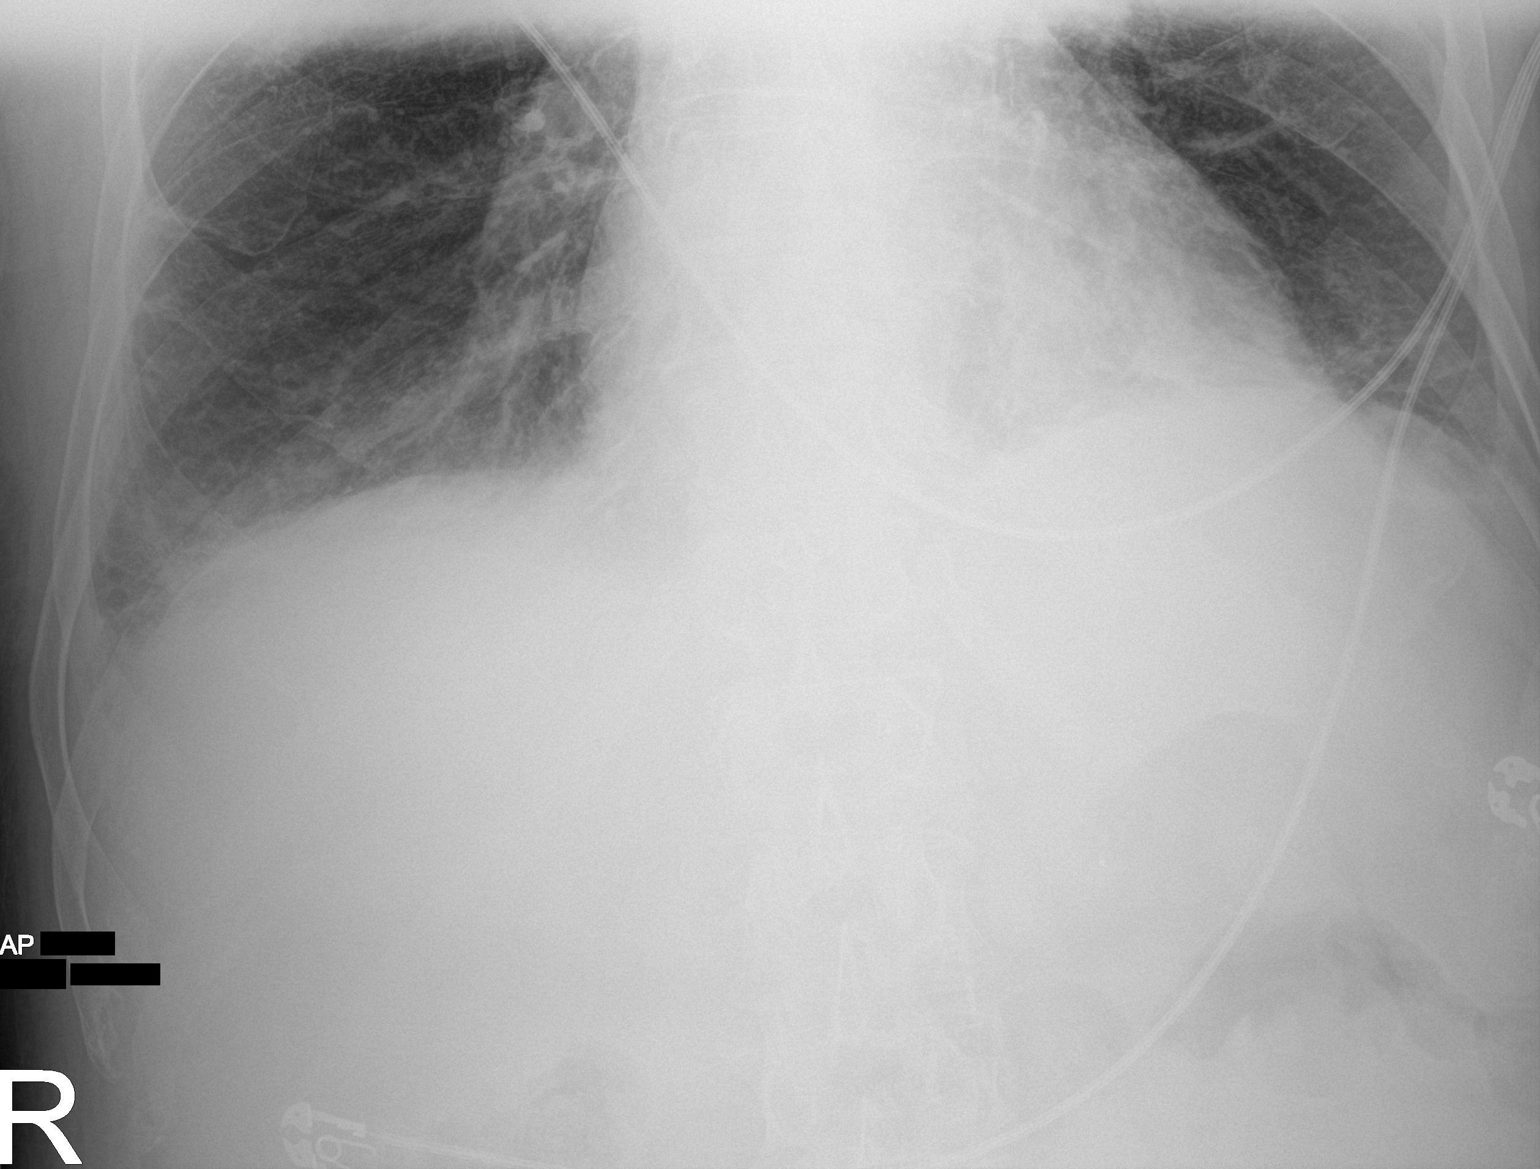

[3 of 3 positions shown; findings below may reference images not displayed]

FINDINGS: Tubing projects over the neck of the top of the study, image 1. No
feeding tube identified. No pneumothorax. Nodularity in the right
mid lung is stable. An apparent mass in the left perihilar region
measuring 3.4 cm is identified. Mild bibasilar opacities. Stable
retrocardiac opacity. The cardiomediastinal silhouette is stable.
IMPRESSION: 1. Tubing over the neck at the top of the first image may represent
the feeding tube coiled in the hypopharynx. No other evidence of a
feeding tube.
2. Nodularity in the right lung persists. A mass in the left
perihilar region persists. When the patient is able, recommend CT
imaging of the chest when able for further evaluation.
3. Bibasilar opacities may represent atelectasis or developing
infiltrates.

These results will be called to the ordering clinician or
representative by the Radiologist Assistant, and communication
documented in the PACS or [REDACTED].

## 2022-03-06 MED ORDER — DEXMEDETOMIDINE HCL IN NACL 400 MCG/100ML IV SOLN
0.2000 ug/kg/h | INTRAVENOUS | Status: DC
  Administered 2022-03-06: 0.4 ug/kg/h via INTRAVENOUS
  Administered 2022-03-06: 0.5 ug/kg/h via INTRAVENOUS
  Administered 2022-03-07: 1 ug/kg/h via INTRAVENOUS
  Administered 2022-03-07: 0.5 ug/kg/h via INTRAVENOUS
  Administered 2022-03-07: 1 ug/kg/h via INTRAVENOUS
  Administered 2022-03-08 (×2): 1.2 ug/kg/h via INTRAVENOUS
  Filled 2022-03-06 (×8): qty 100

## 2022-03-06 MED ORDER — ACETYLCYSTEINE 20 % IN SOLN
3.0000 mL | Freq: Two times a day (BID) | RESPIRATORY_TRACT | Status: DC
Start: 2022-03-06 — End: 2022-03-06

## 2022-03-06 MED ORDER — HYDRALAZINE HCL 20 MG/ML IJ SOLN: 10.0000 mg | INTRAMUSCULAR | Status: DC | PRN

## 2022-03-06 MED ORDER — ACETYLCYSTEINE 20 % IN SOLN
4.0000 mL | Freq: Two times a day (BID) | RESPIRATORY_TRACT | Status: AC
  Administered 2022-03-06 – 2022-03-07 (×2): 4 mL via RESPIRATORY_TRACT
  Filled 2022-03-06 (×2): qty 4

## 2022-03-06 NOTE — Progress Notes (Signed)
Pt self ext at this time. Slow to respond, RN turned off IV sedation. Pt was placed on NRB and Elink RN stated she would let MD know.  ?

## 2022-03-06 NOTE — Progress Notes (Signed)
eLink Physician-Brief Progress Note ?Patient Name: Todd Estrada ?DOB: 01-Oct-1961 ?MRN: [id number] ? ? ?Date of Service ? 03/06/2022  ?HPI/Events of Note ? Patient self extubated, currently saturating 100 % on a non re-breather mask.  ?eICU Interventions ? Will check an ABG to verify respiratory status as he is non verbal.  ? ? ? ?  ? ?Frederik Pear ?03/06/2022, 5:40 AM ?

## 2022-03-06 NOTE — Progress Notes (Signed)
[facility] ? ?Called to bedside for nose bleeding from R nare ?Placed large rhino rocket, no complications ?Would keep in place for 3 days ? ?Roselie Awkward, MD ?[facility] ?Pager: [phone] ?Cell: [phone] ?After 7:00 pm call Elink  [phone] ? ?

## 2022-03-06 NOTE — Progress Notes (Addendum)
STROKE TEAM PROGRESS NOTE  ? ?INTERVAL HISTORY ?Patient is seen in his room with no family at the bedside.  He has been hemodynamically stable overnight and his neurological exam is stable.  He self-extubated early this morning and has been able to protect his airway so far.  He was placed on Precedex for agitation.  Attempted to call patient's uncle Curry Seefeldt regarding patient's situation and ongoing care twice, but he did not answer. ? ?Vitals:  ? 03/06/22 0800 03/06/22 0900 03/06/22 1000 03/06/22 1136  ?BP: (!) 143/78 (!) 163/79 (!) 145/76   ?Pulse: 75 91 85   ?Resp: _0 ?Temp: 99 ?F (37.2 ?C)     ?TempSrc: Axillary     ?SpO2: 94% 97% 93% 98%  ?Weight:      ?Height:      ? ?CBC:  ?Recent Labs  ?Lab 03/02/22 ?1813 03/02/22 ?1822 03/03/22 ?0430 03/03/22 ?1152 03/05/22 ?0455 03/06/22 ?0409 03/06/22 ?0630  ?WBC 9.0   < > 9.7   < > 7.6 7.8  --   ?NEUTROABS 7.0  --  6.9  --   --   --   --   ?HGB 17.3*   < > 15.2   < > 12.8* 12.9* 12.9*  ?HCT 52.2*   < > 45.5   < > 39.0 40.0 38.0*  ?MCV 92.7   < > 91.5   < > 93.5 95.9  --   ?PLT 195   < > 166   < > 129* 143*  --   ? < > = values in this interval not displayed.  ? ? ?Basic Metabolic Panel:  ?Recent Labs  ?Lab 03/04/22 ?4270 03/04/22 ?1857 03/05/22 ?0455 03/06/22 ?0409 03/06/22 ?0630  ?NA 140  --  141 141 142  ?K 4.3  --  3.7 4.0 4.4  ?CL 102  --  104 104  --   ?CO2 29  --  29 29  --   ?GLUCOSE 110*  --  98 114*  --   ?BUN 8  --  14 13  --   ?CREATININE 0.74  --  0.76 0.78  --   ?CALCIUM 8.6*  --  8.5* 8.5*  --   ?MG 2.6* 2.3  --   --   --   ?PHOS 4.3 3.9  --   --   --   ? ? ?Lipid Panel:  ?Recent Labs  ?Lab 03/03/22 ?0430  ?CHOL 115  ?TRIG 104  ?HDL 34*  ?CHOLHDL 3.4  ?VLDL 21  ?Mc[place] 60  ? ? ?HgbA1c:  ?Recent Labs  ?Lab 03/03/22 ?0430  ?HGBA1C 5.4  ? ? ?Urine Drug Screen:  ?Recent Labs  ?Lab 03/03/22 ?6237  ?LABOPIA NONE DETECTED  ?COCAINSCRNUR NONE DETECTED  ?LABBENZ NONE DETECTED  ?AMPHETMU NONE DETECTED  ?THCU NONE DETECTED  ?LABBARB NONE DETECTED  ? ?   ?Alcohol Level  ?Recent Labs  ?Lab 03/02/22 ?1813  ?ETH <10  ? ? ? ?IMAGING past 24 hours ?DG CHEST PORT 1 VIEW ? ?Result Date: 03/06/2022 ?CLINICAL DATA:  Feeding tube placement EXAM: PORTABLE CHEST 1 VIEW COMPARISON:  March 05, 2022 FINDINGS: Tubing projects over the neck of the top of the study, image 1. No feeding tube identified. No pneumothorax. Nodularity in the right mid lung is stable. An apparent mass in the left perihilar region measuring 3.4 cm is identified. Mild bibasilar opacities. Stable retrocardiac opacity. The cardiomediastinal silhouette is stable. IMPRESSION: 1. Tubing over the neck at the top of  the first image may represent the feeding tube coiled in the hypopharynx. No other evidence of a feeding tube. 2. Nodularity in the right lung persists. A mass in the left perihilar region persists. When the patient is able, recommend CT imaging of the chest when able for further evaluation. 3. Bibasilar opacities may represent atelectasis or developing infiltrates. These results will be called to the ordering clinician or representative by the Radiologist Assistant, and communication documented in the PACS or [employer]. Electronically Signed   By: Dorise Bullion III M.D.   On: 03/06/2022 11:03  ? ?DG CHEST PORT 1 VIEW ? ?Result Date: 03/05/2022 ?CLINICAL DATA:  Acute ischemic left MCA stroke.  Seizures. EXAM: PORTABLE CHEST 1 VIEW COMPARISON:  Radiograph 03/03/2022 FINDINGS: Endotracheal tube tip 2.4 cm from the carina. Enteric tube in place with tip and side-port below the diaphragm. Persistent retrocardiac opacity and blunting of left costophrenic angle, probable small left effusion. There may be a trace right pleural effusion with linear atelectasis at the right lung base. Persistent nodular density in the right mid lung measuring approximately 16 mm. There is also a rounded left perihilar opacity. The heart is normal in size. Underlying emphysema. IMPRESSION: 1. Endotracheal tube tip 2.4 cm  from the carina. Enteric tube in place with tip and side-port below the diaphragm. 2. Persistent retrocardiac opacity and blunting of left costophrenic angle, probable small left effusion. 3. Rounded LEFT perihilar opacity may be adenopathy, airspace disease or nodule/mass. 4. Focal nodular opacity in the RIGHT mid lung persists, suspicious for pulmonary nodule. When patient is clinically able, recommend chest CT (with IV contrast in the absence of contraindication) for further assessment of potential malignancy. Electronically Signed   By: Keith Rake M.D.   On: 03/05/2022 15:35  ? ?DG Abd Portable 1V ? ?Result Date: 03/06/2022 ?CLINICAL DATA:  Feeding tube placement EXAM: PORTABLE ABDOMEN - 1 VIEW COMPARISON:  March 05, 2022 FINDINGS: An EKG lead overlies the upper abdomen.  No feeding tube identified. IMPRESSION: No feeding tube identified. Electronically Signed   By: Dorise Bullion III M.D.   On: 03/06/2022 10:58  ? ?DG Abd Portable 1V ? ?Result Date: 03/05/2022 ?CLINICAL DATA:  Acute ischemic left MCA stroke. EXAM: PORTABLE ABDOMEN - 1 VIEW COMPARISON:  Radiograph yesterday. FINDINGS: Tip and side port of the enteric tube below the diaphragm in the stomach. Nonobstructive bowel gas pattern. Prominent vascular calcifications. IMPRESSION: Tip and side port of the enteric tube below the diaphragm in the stomach. Electronically Signed   By: Keith Rake M.D.   On: 03/05/2022 15:36   ? ?PHYSICAL EXAM ?General:  Drowsy, ill-appearing patient in no acute distress ?Respiratory:  Respirations even and unlabored on NRB ? ?Neurological: PERRL, blinks to threat bilaterally. Will not follow commands today but moves all extremities spontaneously. ? ?ASSESSMENT/PLAN ?Todd Estrada is a 61 y.o. male with history of HTN and left MCA stroke in 8/22 presenting after having a seizure.  He taken to the ED and was then found to have right sided weakness, slurred speech and expressive aphasia.  He was found to have left  ICA occlusion on CTA head.  He was then taken for IR thrombectomy.  Left ICA was revascularized, and TICI 2c flow was achieved in left MCA M1 segment.  He was extubated after the procedure but was reintubated 3/16 for inability to protect his airway.  He was extubated and reintubated on 3/18 and self-extubated in the morning of 3/19. ? ?Stroke:  left  MCA infarct due to left ICA and MCA occlusion s/p IR with TICI2c likely secondary large vessel disease with left ICA acute occlusion on chronic stenosis ?CTA head & neck Occlusion of left ICA just distal to bifurcation, diminished perfusion in left MCA territory, chronic occlusion of left vertebral artery, no hemodynamically significant stenosis in the neck ?CT perfusion 121 mL penumbra but no core ?Post IR CT no hemorrhage ?MRI  acute left MCA territory infarct with associated edema ?MRA patent intracranial ICAs, moderate to severe left paraclinoid ICA stenosis, chronically occluded left vertebral artery ?CT head 3/18 showed no frank hematoma or significant hemorrhage ?2D Echo EF 40 to 45% ?LDL 60 ?HgbA1c 5.4 ?VTE prophylaxis - lovenox ?aspirin 81 mg daily prior to admission, now on aspirin 81 mg daily and Brilinta (ticagrelor) 90 mg bid.  ?Therapy recommendations:  pending ?Disposition:  pending ? ?Respiratory Failure ?On exam, patient continues to have large amount of secretions ?Intubated by CCM for inability to protect airway ?Ventilator management per CCM ?Bronchoscopy performed 3/16 with BAL, results pending ?More awake today, will attempt SBT ?Self-extubated 3/19, able to protect airway at this time ? ?Seizure ?Patient found to have a seizure prior to admission ?EEG left hemisphere cortical dysfunction, no seizure ?Keppra 500 mg BID ? ?History of stroke ?07/2021 admitted for right-sided weakness, slurred speech and aphasia.  CT head and neck showed left ICA clinoid segment severe stenosis.  Left VA occlusion.  MRI showed left MCA large infarct with petechial  hemorrhage.  EF 40 to 45%.  LDL 120, A1c 5.5.  Patient discharged on aspirin 81 with residual right-sided weakness and aphasia. ? ?Left ICA stenosis/occlusion ?07/2021 CTA head and neck showed left ICA clinoid

## 2022-03-06 NOTE — Progress Notes (Signed)
Spoke with patients cousin/roommate  ?Cousin states he is patients emergency contact and that he is the sole caregiver for Todd Estrada  as well as Todd Estrada (battling stage 4 cancer, unable to be a point of contact for patient).  ?[name] is the individual who called 911 for pt. States patient stopped drinking 2 days prior to this admission (1/5 liquor per day at least).  ?Patient stroke Hx baseline: RUE no movement, RLE minimal movement, aphasia: no receptive aphasia, some expressive aphasia (stuttering, delayed responses, trouble word finding). Has not been taking any prescribed medications for at least 6 months.  ?[name] states that due to his work schedule he is available to answer the phone anytime after 3pm [phone].  ?

## 2022-03-06 NOTE — Progress Notes (Signed)
Pharmacy Antibiotic Note [name] is a 61 y.o. male admitted on 03/02/2022 with pneumonia.  Pharmacy has been consulted for Unasyn dosing. ? ?ID: asp pna ?WBC 7.8 down, Tmax 99.2 ? ?Unasyn 3/16 > (3/20?) ? ?3/16: Resp Cx: Normal flora x 2 ?3/16: MRSA PCR: neg ?3/15: COVID/flu: negative ? ?Plan: ?Unasyn 3g IV q6h  ?Pharmacy will sign off. Please reconsult for further dosing assitance. ? ? ? ?Height: 6' (182.9 cm) ?Weight: 84.7 kg (186 lb 11.7 oz) ?IBW/kg (Calculated) : 77.6 ? ?Temp (24hrs), Avg:98.8 ?F (37.1 ?C), Min:98.1 ?F (36.7 ?C), Max:99.2 ?F (37.3 ?C) ? ?Recent Labs  ?Lab 03/03/22 ?0430 03/03/22 ?1410 03/04/22 ?7308 03/05/22 ?0455 03/06/22 ?0409  ?WBC 9.7 8.1 11.1* 7.6 7.8  ?CREATININE 0.75 0.78 0.74 0.76 0.78  ?LATICACIDVEN  --  1.4  --   --   --   ?  ?Estimated Creatinine Clearance: 106.4 mL/min (by C-G formula based on SCr of 0.78 mg/dL).   ? ?No Known Allergies ? ?Todd Estrada S. Todd Estrada, PharmD, BCPS ?Clinical Staff Pharmacist ?[email or web address] ? ?Todd Estrada, [employer] ?03/06/2022 10:54 AM ? ?

## 2022-03-06 NOTE — Progress Notes (Signed)
PT Cancellation Note ? ?Patient Details ?Name: Todd Estrada ?MRN: [id number] ?DOB: 29-Sep-1961 ? ? ?Cancelled Treatment:    Reason Eval/Treat Not Completed: Medical issues which prohibited therapy at this time. Pt on sedating meds as noted by RN in previous note, but remains agitated. Will continue to follow and attempt PT evaluation.  ? ?West Carbo, PT, DPT  ? ?Acute Rehabilitation Department ?Pager #: [phone] - [phone] ? ? ?Sandra Cockayne ?03/06/2022, 10:27 AM ?

## 2022-03-06 NOTE — Progress Notes (Signed)
Attempted to place NG tube on R Nare.  Patient was given 73m Versed as well as 50 of fentanyl d/t agitation. Tube met resistance followed by moderate amount of blood.  Place smaller NG tube on L nare.  Alerted Dr. MLake Bellsas to occurrence.  Ordered chest and abd xray.  Orders were also place for IV precedex.  Will administer and continue to observe and act accordingly. ?

## 2022-03-06 NOTE — Progress Notes (Signed)
? ?NAME:  JAYMES HANG, MRN:  [id number], DOB:  [birth year], LOS: 4 ?ADMISSION DATE:  03/02/2022, CONSULTATION DATE:  3/16 ?REFERRING MD:  Erlinda Hong, CHIEF COMPLAINT:  Confusion, seizure  ? ?History of Present Illness:  ?61 y/o male with a history of Left MCA stroke in 3419 with a complicated post stroke course presented on 3/15 with a 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  Intubated on 3/16 for airway protection. ? ?Pertinent  Medical History  ?Hyeprtension ?Left MCA stroke ?Chronic combined CHF ? ?Significant Hospital Events: ?Including procedures, antibiotic start and stop dates in addition to other pertinent events   ?3/15 admitted for acute L MCA stroke due to L ICA occlusion with first time seizure s/p revascularization of L ICA occlusion ?3/16 Intubated for airway protection; started unasyn; echo LVEF 40-45%, RVSP normal ?3/17 MRI Brain showed acute infarct in left frontal, temporal and parietal lobes with findings worrisome for acute infarct, left vert occluded; CT head did not show hemorrhage; respiratory notes thick secretions,  ?3/18 following some commands on exam, extubated the re-intubated due to hypoxemia, inability to clear secretions ?3/19 self extubated overnight, remains on NRB ? ?Interim History / Subjective:  ? ?Yesterday: following some commands on exam, extubated the re-intubated due to hypoxemia, inability to clear secretions ?Today: self extubated overnight, remains on NRB, agitated, pulling at tubes and lines ? ?Objective   ?Blood pressure (!) 143/78, pulse 75, temperature 99 ?F (37.2 ?C), temperature source Axillary, resp. rate 10, height 6' (1.829 m), weight 84.7 kg, SpO2 94 %. ?   ?Vent Mode: PRVC ?FiO2 (%):  [50 %-60 %] 50 % ?Set Rate:  [14 bmp] 14 bmp ?Vt Set:  [620 mL] 620 mL ?PEEP:  [5 cmH20] 5 cmH20 ?Pressure Support:  [5 cmH20] 5 cmH20 ?Plateau Pressure:  [12 cmH20-14 cmH20] 14 cmH20  ? ?Intake/Output Summary (Last 24 hours) at 03/06/2022 0918 ?Last data filed at  03/06/2022 0800 ?Gross per 24 hour  ?Intake 2542.83 ml  ?Output 1650 ml  ?Net 892.83 ml  ? ?Filed Weights  ? 03/02/22 2000 03/03/22 0339 03/03/22 1000  ?Weight: 87.9 kg 84.7 kg 84.7 kg  ? ? ?Examination: ? ?General:  moving around in bed  ?HENT: NCAT bloody nares ?PULM: Diminished bilaterally B, normal effort ?CV: RRR, no mgr ?GI: BS+, soft, nontender ?MSK: normal bulk and tone ?Neuro: agitated, moving around, not following commands, makes purposeful commands (pulling at tubes, lines) ? ? ?Resolved Hospital Problem list   ?Septic shock due to aspiration pneumonia> resolved ? ?Assessment & Plan:  ?Acute encephalopathy due to left MCA stroke from left ICA occlusion requiring thrombectomy and stent placement on 3/15 ?Seizure disorder ?Unclear if baseline etoh use ?Agitated delirium on 3/19> risk to self pulling at tubes, lines ?Keppra to continue ?Continue to monitor neuro exam ?Secondary stroke prevention per neurology/stroke service ?BP goals and glucose goals per stroke service ?Statin, antiplatelet agent per stroke service ?Imaging per stroke service ?PT/OT/SLP ?Cautiously use precedex for sedation on 3/19, monitor for bradycardia ?D/c PAD protocol ? ?Acute hypoxemic respiratory failure due to aspiration pneumonia > respiratory status tenuous but not worsening on 3/19 ?Thick pulmonary secretions ?Concern for emphysema/COPD > not in history but exam supports ?Aspiration risk ?Continue hypertonic saline and guaifenesin, trial mucomyst today ?Continue duoneb ?Continue unasyn for 5 days ?Monitor respiratory status closely ?NPO, aspiration precautions ?Place NG tube for meds ? ?Combined systolic and diastolic heart failure, chronic ?Hypertension ?Continue to monitor hemodynamics ?Hold metoprolol for now as starting precedex  and had bradycardia previously ?Add hydralazine prn SBP > 160 ? ?Best Practice (right click and "Reselect all SmartList Selections" daily)  ? ?Diet/type: tubefeeds ?DVT prophylaxis: LMWH ?GI  prophylaxis: PPI ?Lines: N/A ?Foley:  N/A ?Code Status:  full code ?Last date of multidisciplinary goals of care discussion [per stroke service] ? ?Labs   ?CBC: ?Recent Labs  ?Lab 03/02/22 ?1813 03/02/22 ?1822 03/03/22 ?0430 03/03/22 ?1152 03/03/22 ?1410 03/03/22 ?1423 03/04/22 ?9562 03/05/22 ?0455 03/06/22 ?0409 03/06/22 ?0630  ?WBC 9.0   < > 9.7  --  8.1  --  11.1* 7.6 7.8  --   ?NEUTROABS 7.0  --  6.9  --   --   --   --   --   --   --   ?HGB 17.3*   < > 15.2   < > 13.8 13.9 14.7 12.8* 12.9* 12.9*  ?HCT 52.2*   < > 45.5   < > 43.0 41.0 46.0 39.0 40.0 38.0*  ?MCV 92.7   < > 91.5  --  93.9  --  93.9 93.5 95.9  --   ?PLT 195   < > 166  --  132*  --  169 129* 143*  --   ? < > = values in this interval not displayed.  ? ? ?Basic Metabolic Panel: ?Recent Labs  ?Lab 03/03/22 ?0430 03/03/22 ?1152 03/03/22 ?1410 03/03/22 ?1423 03/03/22 ?1638 03/04/22 ?1308 03/04/22 ?1857 03/05/22 ?0455 03/06/22 ?0409 03/06/22 ?0630  ?NA 136   < > 140 141  --  140  --  141 141 142  ?K 3.5   < > 3.1* 3.0*  --  4.3  --  3.7 4.0 4.4  ?CL 101  --  103  --   --  102  --  104 104  --   ?CO2 26  --  28  --   --  29  --  29 29  --   ?GLUCOSE 103*  --  144*  --   --  110*  --  98 114*  --   ?BUN 6*  --  6*  --   --  8  --  14 13  --   ?CREATININE 0.75  --  0.78  --   --  0.74  --  0.76 0.78  --   ?CALCIUM 8.5*  --  9.1  --   --  8.6*  --  8.5* 8.5*  --   ?MG  --   --  1.4*  --  1.7 2.6* 2.3  --   --   --   ?PHOS  --   --  4.4  --  4.0 4.3 3.9  --   --   --   ? < > = values in this interval not displayed.  ? ?GFR: ?Estimated Creatinine Clearance: 106.4 mL/min (by C-G formula based on SCr of 0.78 mg/dL). ?Recent Labs  ?Lab 03/03/22 ?1410 03/04/22 ?6578 03/05/22 ?0455 03/06/22 ?0409  ?WBC 8.1 11.1* 7.6 7.8  ?LATICACIDVEN 1.4  --   --   --   ? ? ?Liver Function Tests: ?Recent Labs  ?Lab 03/02/22 ?1813  ?AST 31  ?ALT 22  ?ALKPHOS 58  ?BILITOT 1.4*  ?PROT 6.3*  ?ALBUMIN 3.6  ? ?No results for input(s): LIPASE, AMYLASE in the last 168 hours. ?No results  for input(s): AMMONIA in the last 168 hours. ? ?ABG ?   ?Component Value Date/Time  ? PHART 7.359 03/06/2022 0630  ? PCO2ART 58.4 (H) 03/06/2022  0630  ? PO2ART 168 (H) 03/06/2022 0630  ? HCO3 33.0 (H) 03/06/2022 0630  ? TCO2 35 (H) 03/06/2022 0630  ? O2SAT 99 03/06/2022 0630  ?  ? ?Coagulation Profile: ?No results for input(s): INR, PROTIME in the last 168 hours. ? ?Cardiac Enzymes: ?No results for input(s): CKTOTAL, CKMB, CKMBINDEX, TROPONINI in the last 168 hours. ? ?HbA1C: ?Hgb A1c MFr Bld  ?Date/Time Value Ref Range Status  ?03/03/2022 04:30 AM 5.4 4.8 - 5.6 % Final  ?  Comment:  ?  (NOTE) ?        Prediabetes: 5.7 - 6.4 ?        Diabetes: >6.4 ?        Glycemic control for adults with diabetes: <7.0 ?  ?08/19/2021 07:49 AM 5.5 4.8 - 5.6 % Final  ?  Comment:  ?  (NOTE) ?Pre diabetes:          5.7%-6.4% ? ?Diabetes:              >6.4% ? ?Glycemic control for   <7.0% ?adults with diabetes ?  ? ? ?CBG: ?Recent Labs  ?Lab 03/05/22 ?1516 03/05/22 ?2016 03/05/22 ?2349 03/06/22 ?0324 03/06/22 ?0801  ?GLUCAP 138* 115* 117* 142* 136*  ? ? ?  ? ?Critical care time: 34 minutes ?  ? ?Roselie Awkward, MD ?[facility] ?Pager: [phone] ?Cell: [phone] ?After 7:00 pm call Elink  [phone] ? ? ? ? ?

## 2022-03-07 DIAGNOSIS — I6522 Occlusion and stenosis of left carotid artery: Secondary | ICD-10-CM | POA: Diagnosis not present

## 2022-03-07 DIAGNOSIS — Z8673 Personal history of transient ischemic attack (TIA), and cerebral infarction without residual deficits: Secondary | ICD-10-CM

## 2022-03-07 DIAGNOSIS — E440 Moderate protein-calorie malnutrition: Secondary | ICD-10-CM

## 2022-03-07 DIAGNOSIS — I63512 Cerebral infarction due to unspecified occlusion or stenosis of left middle cerebral artery: Secondary | ICD-10-CM | POA: Diagnosis not present

## 2022-03-07 DIAGNOSIS — J9621 Acute and chronic respiratory failure with hypoxia: Secondary | ICD-10-CM | POA: Diagnosis not present

## 2022-03-07 DIAGNOSIS — R569 Unspecified convulsions: Secondary | ICD-10-CM | POA: Diagnosis not present

## 2022-03-07 LAB — BASIC METABOLIC PANEL
Anion gap: 8 (ref 5–15)
BUN: 9 mg/dL (ref 8–23)
CO2: 28 mmol/L (ref 22–32)
Calcium: 8.7 mg/dL — ABNORMAL LOW (ref 8.9–10.3)
Chloride: 103 mmol/L (ref 98–111)
Creatinine, Ser: 0.66 mg/dL (ref 0.61–1.24)
GFR, Estimated: >60 mL/min (ref >60)
Glucose, Bld: 126 mg/dL — ABNORMAL HIGH (ref 70–99)
Potassium: 4.1 mmol/L (ref 3.5–5.1)
Sodium: 139 mmol/L (ref 135–145)

## 2022-03-07 LAB — CBC
HCT: 43.1 % (ref 39.0–52.0)
Hemoglobin: 14.1 g/dL (ref 13.0–17.0)
MCH: 30.4 pg (ref 26.0–34.0)
MCHC: 32.7 g/dL (ref 30.0–36.0)
MCV: 92.9 fL (ref 80.0–100.0)
Platelets: 138 10*3/uL — ABNORMAL LOW (ref 150–400)
RBC: 4.64 MIL/uL (ref 4.22–5.81)
RDW: 12.9 % (ref 11.5–15.5)
WBC: 9.4 10*3/uL (ref 4.0–10.5)
nRBC: 0 % (ref 0.0–0.2)

## 2022-03-07 LAB — GLUCOSE, CAPILLARY
Glucose-Capillary: 125 mg/dL — ABNORMAL HIGH (ref 70–99)
Glucose-Capillary: 127 mg/dL — ABNORMAL HIGH (ref 70–99)
Glucose-Capillary: 133 mg/dL — ABNORMAL HIGH (ref 70–99)
Glucose-Capillary: 125 mg/dL — ABNORMAL HIGH (ref 70–99)
Glucose-Capillary: 123 mg/dL — ABNORMAL HIGH (ref 70–99)
Glucose-Capillary: 135 mg/dL — ABNORMAL HIGH (ref 70–99)

## 2022-03-07 MED ORDER — IPRATROPIUM-ALBUTEROL 0.5-2.5 (3) MG/3ML IN SOLN
3.0000 mL | Freq: Four times a day (QID) | RESPIRATORY_TRACT | Status: DC
  Administered 2022-03-07 – 2022-03-08 (×3): 3 mL via RESPIRATORY_TRACT
  Filled 2022-03-07 (×2): qty 3

## 2022-03-07 MED ORDER — PHENOBARBITAL SODIUM 65 MG/ML IJ SOLN
32.5000 mg | Freq: Three times a day (TID) | INTRAMUSCULAR | Status: AC
  Administered 2022-03-09 – 2022-03-11 (×6): 32.5 mg via INTRAVENOUS
  Filled 2022-03-07 (×7): qty 1

## 2022-03-07 MED ORDER — CHLORHEXIDINE GLUCONATE 0.12 % MT SOLN
15.0000 mL | Freq: Two times a day (BID) | OROMUCOSAL | Status: DC
  Administered 2022-03-07 – 2022-03-08 (×2): 15 mL via OROMUCOSAL
  Filled 2022-03-07: qty 15

## 2022-03-07 MED ORDER — BISACODYL 10 MG RE SUPP
10.0000 mg | Freq: Every day | RECTAL | Status: DC
  Administered 2022-03-07: 10 mg via RECTAL
  Filled 2022-03-07 (×2): qty 1

## 2022-03-07 MED ORDER — ASPIRIN 300 MG RE SUPP
300.0000 mg | Freq: Every day | RECTAL | Status: DC
  Administered 2022-03-07 – 2022-03-09 (×3): 300 mg via RECTAL
  Filled 2022-03-07 (×3): qty 1

## 2022-03-07 MED ORDER — HYDRALAZINE HCL 20 MG/ML IJ SOLN
10.0000 mg | INTRAMUSCULAR | Status: DC | PRN
  Administered 2022-03-08: 20 mg via INTRAVENOUS
  Filled 2022-03-07: qty 1

## 2022-03-07 MED ORDER — PHENOBARBITAL SODIUM 65 MG/ML IJ SOLN
65.0000 mg | Freq: Three times a day (TID) | INTRAMUSCULAR | Status: AC
  Administered 2022-03-07 – 2022-03-09 (×6): 65 mg via INTRAVENOUS
  Filled 2022-03-07 (×6): qty 1

## 2022-03-07 MED ORDER — ORAL CARE MOUTH RINSE
15.0000 mL | OROMUCOSAL | Status: DC
  Administered 2022-03-07 – 2022-03-08 (×11): 15 mL via OROMUCOSAL

## 2022-03-07 MED ORDER — LORAZEPAM 2 MG/ML IJ SOLN
1.0000 mg | INTRAMUSCULAR | Status: DC | PRN
  Administered 2022-03-08 – 2022-03-12 (×3): 1 mg via INTRAVENOUS
  Filled 2022-03-07 (×3): qty 1

## 2022-03-07 MED ORDER — FUROSEMIDE 10 MG/ML IJ SOLN
40.0000 mg | Freq: Once | INTRAMUSCULAR | Status: AC
  Administered 2022-03-07: 40 mg via INTRAVENOUS
  Filled 2022-03-07: qty 4

## 2022-03-07 NOTE — Progress Notes (Signed)
? ?NAME:  Todd Estrada, MRN:  [id number], DOB:  [birth year], LOS: 5 ?ADMISSION DATE:  03/02/2022, CONSULTATION DATE:  3/16 ?REFERRING MD:  Erlinda Hong, CHIEF COMPLAINT:  Confusion, seizure  ? ?History of Present Illness:  ?61 y/o male with a history of Left MCA stroke in 4562 with a complicated post stroke course presented on 3/15 with a 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  Intubated on 3/16 for airway protection. ? ?Pertinent  Medical History  ?Hyeprtension ?Left MCA stroke ?Chronic combined CHF ? ?Significant Hospital Events: ?Including procedures, antibiotic start and stop dates in addition to other pertinent events   ?3/15 admitted for acute L MCA stroke due to L ICA occlusion with first time seizure s/p revascularization of L ICA occlusion ?3/16 Intubated for airway protection; started unasyn; echo LVEF 40-45%, RVSP normal ?3/17 MRI Brain showed acute infarct in left frontal, temporal and parietal lobes with findings worrisome for acute infarct, left vert occluded; CT head did not show hemorrhage; respiratory notes thick secretions,  ?3/18 following some commands on exam, extubated the re-intubated due to hypoxemia, inability to clear secretions ?3/19 self extubated overnight, remains on NRB. Rhino rocket placed for R nare epistaxis  ? ?Interim History / Subjective:  ?Patient remains on partial nonrebreather this morning. Intermittently responding to painful stimuli.  ?More awake/alert.  ?Objective   ?Blood pressure (!) 146/77, pulse 65, temperature 98.9 ?F (37.2 ?C), temperature source Axillary, resp. rate 17, height 6' (1.829 m), weight 85.3 kg, SpO2 96 %. ?   ?FiO2 (%):  [100 %] 100 %  ? ?Intake/Output Summary (Last 24 hours) at 03/07/2022 0737 ?Last data filed at 03/07/2022 0600 ?Gross per 24 hour  ?Intake 1805.1 ml  ?Output 2705 ml  ?Net -899.9 ml  ? ?Filed Weights  ? 03/03/22 0339 03/03/22 1000 03/07/22 0500  ?Weight: 84.7 kg 84.7 kg 85.3 kg  ? ? ?Examination: ? General:  chronically ill  appearing and unkept elderly male, somnolent ?HENT: NCAT bloody nares; rhinorocket in place R nare ?PULM: Diminished bibasilar breath sounds, normal effort ?CV: RRR, no mgr ?GI: BS+, soft, nontender ?MSK: normal bulk and tone ?Neuro: somnolent but arousable to painful stimuli intermittently; spontaneously moving LUE to pull at cath ? ?Resolved Hospital Problem list   ?Septic shock due to aspiration pneumonia> resolved ? ?Assessment & Plan:  ?Acute encephalopathy due to left MCA stroke from left ICA occlusion requiring thrombectomy and stent placement on 3/15 ?Seizure disorder ?Agitation delirium; possible ETOH withdrawal  ?- Continue Keppra ?- Start phenobarbital [phone]  ?- Continue to monitor neuro exam ?- Secondary stroke prevention per neurology/stroke service ?- BP goals and glucose goals per stroke service ?- Continue statin and aspirin; unable to take PO brilinta at this time  ?- PT/OT/SLP ?- Cautiously use precedex for sedation, monitor for bradycardia ? ?Acute hypoxemic respiratory failure due to aspiration pneumonia  ?Thick pulmonary secretions ?Concern for emphysema/COPD  ?Aspiration risk ?- Continue Unasyn ?- Continue hypertonic saline and guaifenesin and duonebs  ?- Oxygen supplement for goal SpO2 88-92% ?- NPO, aspiration precautions ?- Will need cortrak placement on wed ? ?Combined systolic and diastolic heart failure, chronic ?Hypertension ?Continue to monitor hemodynamics ?Add hydralazine prn SBP > 160 ? ?Best Practice (right click and "Reselect all SmartList Selections" daily)  ? ?Diet/type: NPO ?DVT prophylaxis: LMWH ?GI prophylaxis: PPI ?Lines: N/A ?Foley:  N/A ?Code Status:  full code ?Last date of multidisciplinary goals of care discussion [per stroke service] ? ?Labs   ?CBC: ?Recent Labs  ?  Lab 03/02/22 ?1813 03/02/22 ?1822 03/03/22 ?0430 03/03/22 ?1152 03/03/22 ?1410 03/03/22 ?1423 03/04/22 ?0623 03/05/22 ?0455 03/06/22 ?0409 03/06/22 ?0630 03/07/22 ?0446  ?WBC 9.0   < > 9.7  --  8.1  --  11.1*  7.6 7.8  --  9.4  ?NEUTROABS 7.0  --  6.9  --   --   --   --   --   --   --   --   ?HGB 17.3*   < > 15.2   < > 13.8   < > 14.7 12.8* 12.9* 12.9* 14.1  ?HCT 52.2*   < > 45.5   < > 43.0   < > 46.0 39.0 40.0 38.0* 43.1  ?MCV 92.7   < > 91.5  --  93.9  --  93.9 93.5 95.9  --  92.9  ?PLT 195   < > 166  --  132*  --  169 129* 143*  --  138*  ? < > = values in this interval not displayed.  ? ? ?Basic Metabolic Panel: ?Recent Labs  ?Lab 03/03/22 ?1410 03/03/22 ?1423 03/03/22 ?1638 03/04/22 ?7628 03/04/22 ?1857 03/05/22 ?3151 03/06/22 ?0409 03/06/22 ?0630 03/07/22 ?0446  ?NA 140   < >  --  140  --  141 141 142 139  ?K 3.1*   < >  --  4.3  --  3.7 4.0 4.4 4.1  ?CL 103  --   --  102  --  104 104  --  103  ?CO2 28  --   --  29  --  29 29  --  28  ?GLUCOSE 144*  --   --  110*  --  98 114*  --  126*  ?BUN 6*  --   --  8  --  14 13  --  9  ?CREATININE 0.78  --   --  0.74  --  0.76 0.78  --  0.66  ?CALCIUM 9.1  --   --  8.6*  --  8.5* 8.5*  --  8.7*  ?MG 1.4*  --  1.7 2.6* 2.3  --   --   --   --   ?PHOS 4.4  --  4.0 4.3 3.9  --   --   --   --   ? < > = values in this interval not displayed.  ? ?GFR: ?Estimated Creatinine Clearance: 106.4 mL/min (by C-G formula based on SCr of 0.66 mg/dL). ?Recent Labs  ?Lab 03/03/22 ?1410 03/04/22 ?7616 03/05/22 ?0455 03/06/22 ?0409 03/07/22 ?0446  ?WBC 8.1 11.1* 7.6 7.8 9.4  ?LATICACIDVEN 1.4  --   --   --   --   ? ? ?Liver Function Tests: ?Recent Labs  ?Lab 03/02/22 ?1813  ?AST 31  ?ALT 22  ?ALKPHOS 58  ?BILITOT 1.4*  ?PROT 6.3*  ?ALBUMIN 3.6  ? ?No results for input(s): LIPASE, AMYLASE in the last 168 hours. ?No results for input(s): AMMONIA in the last 168 hours. ? ?ABG ?   ?Component Value Date/Time  ? PHART 7.359 03/06/2022 0630  ? PCO2ART 58.4 (H) 03/06/2022 0630  ? PO2ART 168 (H) 03/06/2022 0630  ? HCO3 33.0 (H) 03/06/2022 0630  ? TCO2 35 (H) 03/06/2022 0630  ? O2SAT 99 03/06/2022 0630  ?  ? ?Coagulation Profile: ?No results for input(s): INR, PROTIME in the last 168 hours. ? ?Cardiac  Enzymes: ?No results for input(s): CKTOTAL, CKMB, CKMBINDEX, TROPONINI in the last 168 hours. ? ?HbA1C: ?Hgb A1c MFr  Bld  ?Date/Time Value Ref Range Status  ?03/03/2022 04:30 AM 5.4 4.8 - 5.6 % Final  ?  Comment:  ?  (NOTE) ?        Prediabetes: 5.7 - 6.4 ?        Diabetes: >6.4 ?        Glycemic control for adults with diabetes: <7.0 ?  ?08/19/2021 07:49 AM 5.5 4.8 - 5.6 % Final  ?  Comment:  ?  (NOTE) ?Pre diabetes:          5.7%-6.4% ? ?Diabetes:              >6.4% ? ?Glycemic control for   <7.0% ?adults with diabetes ?  ? ? ?CBG: ?Recent Labs  ?Lab 03/06/22 ?1545 03/06/22 ?1933 03/06/22 ?2309 03/07/22 ?3300 03/07/22 ?0732  ?GLUCAP 118* 136* 136* 125* 127*  ? ?Critical care time: 34 minutes ?  ? ? ? ? ? ? ?

## 2022-03-07 NOTE — Progress Notes (Signed)
Per MD J. Xu, keep patient NPO at this time. Will continue rhino rocket in left nare until Wednesday per CCM. Plan to place cortrak at that time if patient unable to pass [place] swallow.  Orders for ASA rectally, MD okay for patient to go without brilinta.   ?

## 2022-03-07 NOTE — Progress Notes (Addendum)
STROKE TEAM PROGRESS NOTE  ? ?INTERVAL HISTORY ?RN at bedside.  Patient still on facemask but so far tolerating self extubation.  Open eyes on voice, but still not quite follow commands.  Still has right hemiplegia, left gaze preference.  Had nose traumatic bleeding yesterday due to NG tube placement.  Currently on [employer].  Will hold off core track placement until Wednesday.  We will start IV fluid. ? ? ?Vitals:  ? 03/07/22 0700 03/07/22 0728 03/07/22 0745 03/07/22 0800  ?BP: (!) 146/77 (!) 146/77    ?Pulse: 69 65 66   ?Resp: _0 ?Temp:    98.4 ?F (36.9 ?C)  ?TempSrc:    Axillary  ?SpO2: 96% 96% 93%   ?Weight:      ?Height:      ? ?CBC:  ?Recent Labs  ?Lab 03/02/22 ?1813 03/02/22 ?1822 03/03/22 ?0430 03/03/22 ?1152 03/06/22 ?0409 03/06/22 ?0630 03/07/22 ?0446  ?WBC 9.0   < > 9.7   < > 7.8  --  9.4  ?NEUTROABS 7.0  --  6.9  --   --   --   --   ?HGB 17.3*   < > 15.2   < > 12.9* 12.9* 14.1  ?HCT 52.2*   < > 45.5   < > 40.0 38.0* 43.1  ?MCV 92.7   < > 91.5   < > 95.9  --  92.9  ?PLT 195   < > 166   < > 143*  --  138*  ? < > = values in this interval not displayed.  ? ?Basic Metabolic Panel:  ?Recent Labs  ?Lab 03/04/22 ?9675 03/04/22 ?1857 03/05/22 ?9163 03/06/22 ?0409 03/06/22 ?0630 03/07/22 ?0446  ?NA 140  --    < > 141 142 139  ?K 4.3  --    < > 4.0 4.4 4.1  ?CL 102  --    < > 104  --  103  ?CO2 29  --    < > 29  --  28  ?GLUCOSE 110*  --    < > 114*  --  126*  ?BUN 8  --    < > 13  --  9  ?CREATININE 0.74  --    < > 0.78  --  0.66  ?CALCIUM 8.6*  --    < > 8.5*  --  8.7*  ?MG 2.6* 2.3  --   --   --   --   ?PHOS 4.3 3.9  --   --   --   --   ? < > = values in this interval not displayed.  ? ?Lipid Panel:  ?Recent Labs  ?Lab 03/03/22 ?0430  ?CHOL 115  ?TRIG 104  ?HDL 34*  ?CHOLHDL 3.4  ?VLDL 21  ?[place] 60  ? ?HgbA1c:  ?Recent Labs  ?Lab 03/03/22 ?0430  ?HGBA1C 5.4  ? ?Urine Drug Screen:  ?Recent Labs  ?Lab 03/03/22 ?8466  ?LABOPIA NONE DETECTED  ?COCAINSCRNUR NONE DETECTED  ?LABBENZ NONE DETECTED   ?AMPHETMU NONE DETECTED  ?THCU NONE DETECTED  ?LABBARB NONE DETECTED  ?  ?Alcohol Level  ?Recent Labs  ?Lab 03/02/22 ?1813  ?ETH <10  ? ? ?IMAGING past 24 hours ?DG CHEST PORT 1 VIEW ? ?Result Date: 03/06/2022 ?CLINICAL DATA:  Feeding tube placement EXAM: PORTABLE CHEST 1 VIEW COMPARISON:  March 05, 2022 FINDINGS: Tubing projects over the neck of the top of the study, image 1. No feeding tube identified. No pneumothorax. Nodularity in  the right mid lung is stable. An apparent mass in the left perihilar region measuring 3.4 cm is identified. Mild bibasilar opacities. Stable retrocardiac opacity. The cardiomediastinal silhouette is stable. IMPRESSION: 1. Tubing over the neck at the top of the first image may represent the feeding tube coiled in the hypopharynx. No other evidence of a feeding tube. 2. Nodularity in the right lung persists. A mass in the left perihilar region persists. When the patient is able, recommend CT imaging of the chest when able for further evaluation. 3. Bibasilar opacities may represent atelectasis or developing infiltrates. These results will be called to the ordering clinician or representative by the Radiologist Assistant, and communication documented in the PACS or [employer]. Electronically Signed   By: Dorise Bullion III M.D.   On: 03/06/2022 11:03  ? ?DG Abd Portable 1V ? ?Result Date: 03/06/2022 ?CLINICAL DATA:  Feeding tube placement EXAM: PORTABLE ABDOMEN - 1 VIEW COMPARISON:  March 05, 2022 FINDINGS: An EKG lead overlies the upper abdomen.  No feeding tube identified. IMPRESSION: No feeding tube identified. Electronically Signed   By: Dorise Bullion III M.D.   On: 03/06/2022 10:58   ? ?PHYSICAL EXAM ?General:  Drowsy, ill-appearing patient in no acute distress ?Respiratory:  Respirations even and unlabored on NRB ? ?Neurological: On facemask, open eyes on voice, but not quite follow commands, eyes left gaze preference, not tracking, more consistent blinking to visual threat  on the left but not blinking on the right.  Left upper extremity against gravity no drift, right upper extremity mild withdraw to pain.  Bilateral lower extremity withdraw to pain more on the left than right. Sensation, coordination and gait not tested. ? ?ASSESSMENT/PLAN ?Todd Estrada is a 61 y.o. male with history of HTN and left MCA stroke in 8/22 presenting after having a seizure. He was taken to the ED and was then found to have right sided weakness, slurred speech and expressive aphasia.  He was found to have left ICA occlusion on CTA head.  He was then taken for IR thrombectomy.  Left ICA was revascularized, and TICI 2c flow was achieved in left MCA M1 segment.  He was extubated after the procedure but was reintubated 3/16 for inability to protect his airway.  He was extubated and reintubated on 3/18 and self-extubated in the morning of 3/19. ? 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 s/p IR with TICI2c likely secondary large vessel disease with left ICA acute occlusion on chronic stenosis ?CTA head & neck Occlusion of left ICA just distal to bifurcation, diminished perfusion in left MCA territory, chronic occlusion of left vertebral artery, no hemodynamically significant stenosis in the neck ?CT perfusion 121 mL penumbra but no core ?Post IR CT no hemorrhage ?MRI  acute left MCA territory infarct with associated edema ?MRA patent intracranial ICAs, moderate to severe left paraclinoid ICA stenosis, chronically occluded left vertebral artery ?CT head 3/18 showed no frank hematoma or significant hemorrhage ?2D Echo EF 40 to 45% ?LDL 60 ?HgbA1c 5.4 ?VTE prophylaxis - lovenox ?aspirin 81 mg daily prior to admission, now on aspirin 300 PR given no po access ?Therapy recommendations: CIR ?Disposition:  pending ? ?Respiratory Failure ?Intubated by CCM for inability to protect airway ?Ventilator management per CCM ?Bronchoscopy performed 3/16 with BAL ?Extubated and reintubated  3/18 ?Self-extubated 3/19, able to protect airway at this time ? ?Seizure ?Patient found to have a seizure prior to admission ?EEG left hemisphere cortical dysfunction, no  seizure ?Keppra 500 mg BID ? ?History of stroke ?07/2021 admitted for right-sided weakness, slurred speech and aphasia.  CT head and neck showed left ICA clinoid segment severe stenosis.  Left VA occlusion.  MRI showed left MCA large infarct with petechial hemorrhage.  EF 40 to 45%.  LDL 120, A1c 5.5.  Patient discharged on aspirin 81 with residual right-sided weakness and aphasia. ? ?Left ICA stenosis/occlusion ?07/2021 CTA head and neck showed left ICA clinoid segment severe stenosis ?This admission CT head and neck showed left ICA occlusion ?Status post ICA siphon angioplasty with residual 40 to 50% stenosis. ?MRA patent intracranial ICAs, moderate to severe left paraclinoid ICA stenosis ?On aspirin 300 PR now given no p.o. access ? ?Hypertension ?Home meds:  metoprolol 12.5 mg BID ?Stable ?BP goal < 180/105 ?Long-term BP goal normotensive ? ?Hyperlipidemia ?Home meds:  atorvastatin 80 mg daily, not sure if taking PTA ?LDL 60, goal < 70 ?Resume lipitor 40 once p.o. access ?Continue statin at discharge ? ?Dysphagia ?Self extubated ?Currently no p.o. access ?N.p.o. status, on IV fluid ?Likely need core track on Wednesday ?Speech on board ? ?Tobacco abuse ?Current smoker ?Smoking cessation counseling will be provided ? ?Other Stroke Risk Factors ? ? ?Other Active Problems ?Leukocytosis WBC 11.1 -> 7.6->9.4 ?Agitation- precedex started 3/19 ?Epistaxis with NG placement -on left nare Rhino Rocket ?Thrombocytopenia - Plts - 195->166->143->138 ?Per pt cousin, patient had stroke with residual expressive aphasia and right upper extremity flaccid and right lower extremity minimal movement. ? ?Hospital day # 5 ? ?This patient is critically ill due to left MCA stroke, respiratory failure, seizure and at significant risk of neurological worsening, death form  recurrent stroke, hemorrhagic admission, sepsis, aspiration pneumonia, status epilepticus. This patient's care requires constant monitoring of vital signs, hemodynamics, respiratory and cardiac monitoring, review of multi

## 2022-03-07 NOTE — Evaluation (Signed)
Physical Therapy Evaluation ?Patient Details ?Name: Todd Estrada ?MRN: [id number] ?DOB: Dec 17, 1961 ?Today's Date: 03/07/2022 ? ?History of Present Illness ? 61 y/o male with a history of Left MCA stroke in 8366 with a complicated post stroke course presented on 3/15 with a 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  He was extubated following procedure, but reintubated due to respiratory failure.  Self extubated overnight 3/19, now on venturi mask.  ?Clinical Impression ? Patient presents with decreased mobility due to R side weakness, decreased balance, decreased deficit/safety awareness, and decreased activity tolerance.  Currently he needs +2 for safety with EOB and sit to stand activity due to impulsivity/restlessness.  Previously living at home with uncle and cousin with some residual deficits from stroke last year.  Unsure of prior functional status.  Feel he will benefit from skilled PT in the acute setting and follow up acute inpatient rehab if able to have supervision assist at home. ?   ? ?Recommendations for follow up therapy are one component of a multi-disciplinary discharge planning process, led by the attending physician.  Recommendations may be updated based on patient status, additional functional criteria and insurance authorization. ? ?Follow Up Recommendations Acute inpatient rehab (3hours/day) ? ?  ?Assistance Recommended at Discharge Frequent or constant Supervision/Assistance  ?Patient can return home with the following ? Two people to help with walking and/or transfers;A lot of help with bathing/dressing/bathroom;Direct supervision/assist for medications management;Assist for transportation;Help with stairs or ramp for entrance ? ?  ?Equipment Recommendations Other (comment) (TBA)  ?Recommendations for Other Services ? Rehab consult  ?  ?Functional Status Assessment Patient has had a recent decline in their functional status and demonstrates the ability to make  significant improvements in function in a reasonable and predictable amount of time.  ? ?  ?Precautions / Restrictions Precautions ?Precautions: Fall ?Precaution Comments: mitts, posey, wrist restraints  ? ?  ? ?Mobility ? Bed Mobility ?Overal bed mobility: Needs Assistance ?Bed Mobility: Supine to Sit, Sit to Supine ?  ?  ?Supine to sit: HOB elevated, Mod assist ?Sit to supine: Mod assist ?  ?General bed mobility comments: assist for initiating up to EOB and for R LE, pt able to lift trunk, leaves R arm behind him; to supine assist for positioning and safety as pt attempting back up after to supine, but may be due to laying flat initially ?  ? ?Transfers ?Overall transfer level: Needs assistance ?Equipment used: 2 person hand held assist ?Transfers: Sit to/from Stand ?Sit to Stand: Mod assist, +2 safety/equipment ?  ?  ?  ?  ?  ?General transfer comment: Patient initiated sit to stand in response to commands and needed help for balance/safety/some lifting and lines; stood x 2 for positioning ?  ? ?Ambulation/Gait ?  ?  ?  ?  ?  ?  ?  ?General Gait Details: unable to take side steps on command to move up toward [facility], simpy returned to sitting ? ?Stairs ?  ?  ?  ?  ?  ? ?Wheelchair Mobility ?  ? ?Modified Rankin (Stroke Patients Only) ?Modified Rankin (Stroke Patients Only) ?Pre-Morbid Rankin Score: Moderate disability ?Modified Rankin: Severe disability ? ?  ? ?Balance Overall balance assessment: Needs assistance ?Sitting-balance support: Feet supported ?Sitting balance-Leahy Scale: Fair ?  ?  ?Standing balance support: Bilateral upper extremity supported ?Standing balance-Leahy Scale: Poor ?Standing balance comment: min A x 2 for safety with balance with UE support ?  ?  ?  ?  ?  ?  ?  ?  ?  ?  ?  ?   ? ? ? ?  Pertinent Vitals/Pain Pain Assessment ?Facial Expression: Relaxed, neutral ?Body Movements: Absence of movements ?Muscle Tension: Relaxed ?Compliance with ventilator (intubated pts.): N/A ?Vocalization  (extubated pts.): Talking in normal tone or no sound ?CPOT Total: 0  ? ? ?Home Living Family/patient expects to be discharged to:: Private residence ?Living Arrangements: Other relatives (per chart lives with cousin and uncle) ?  ?Type of Home: House ?Home Access: Level entry ?  ?  ?  ?Home Layout: One level ?Home Equipment: None ?Additional Comments: home information from prior admission, pt unable to state  ?  ?Prior Function   ?  ?  ?  ?  ?  ?  ?Mobility Comments: unable to state, noted had residual R side weaknes from prior stroke and aphasia; today grunts to questions through venturi mask ?  ?  ? ? ?Hand Dominance  ? Dominant Hand: Right ? ?  ?Extremity/Trunk Assessment  ? Upper Extremity Assessment ?Upper Extremity Assessment: RUE deficits/detail ?RUE Deficits / Details: flaccid ?RUE: Subluxation noted ?  ? ?Lower Extremity Assessment ?Lower Extremity Assessment: RLE deficits/detail ?RLE Deficits / Details: moves antigravity, but not enough strength for full ROM anitgravity, AAROM WFL ?RLE Coordination: decreased gross motor ?  ? ?   ?Communication  ? Communication: Expressive difficulties  ?Cognition Arousal/Alertness: Awake/alert ?Behavior During Therapy: Restless ?Overall Cognitive Status: No family/caregiver present to determine baseline cognitive functioning ?Area of Impairment: Following commands, Safety/judgement, Attention ?  ?  ?  ?  ?  ?  ?  ?  ?  ?Current Attention Level: Sustained ?  ?Following Commands: Follows one step commands with increased time, Follows one step commands inconsistently ?Safety/Judgement: Decreased awareness of deficits, Decreased awareness of safety ?  ?  ?General Comments: difficult to assess as pt with grunts only in respose to questions, followed commands fairly well for mobility assessment ?  ?  ? ?  ?General Comments General comments (skin integrity, edema, etc.): VSS with mobility; on 40% Venturmask 8L O2; noted pt able to regard persons on R side with cues, but eyes not  moving past midline ? ?  ?Exercises    ? ?Assessment/Plan  ?  ?PT Assessment Patient needs continued PT services  ?PT Problem List Decreased strength;Decreased mobility;Decreased safety awareness;Decreased balance;Decreased knowledge of use of DME;Decreased coordination;Decreased cognition ? ?   ?  ?PT Treatment Interventions DME instruction;Therapeutic activities;Cognitive remediation;Patient/family education;Therapeutic exercise;Gait training;Balance training;Functional mobility training   ? ?PT Goals (Current goals can be found in the Care Plan section)  ?Acute Rehab PT Goals ?Patient Stated Goal: unable to state ?PT Goal Formulation: Patient unable to participate in goal setting ?Time For Goal Achievement: 03/21/22 ?Potential to Achieve Goals: Good ? ?  ?Frequency Min 4X/week ?  ? ? ?Co-evaluation   ?  ?  ?  ?  ? ? ?  ?AM-PAC PT "6 Clicks" Mobility  ?Outcome Measure Help needed turning from your back to your side while in a flat bed without using bedrails?: A Little ?Help needed moving from lying on your back to sitting on the side of a flat bed without using bedrails?: A Lot ?Help needed moving to and from a bed to a chair (including a wheelchair)?: A Lot ?Help needed standing up from a chair using your arms (e.g., wheelchair or bedside chair)?: A Lot ?Help needed to walk in hospital room?: Total ?Help needed climbing 3-5 steps with a railing? : Total ?6 Click Score: 11 ? ?  ?End of Session Equipment Utilized During Treatment: Gait belt;Oxygen ?Activity Tolerance: Patient  tolerated treatment well ?Patient left: in bed;with call bell/phone within reach;with bed alarm set;with restraints reapplied ?  ?PT Visit Diagnosis: Other abnormalities of gait and mobility (R26.89);Other symptoms and signs involving the nervous system (R29.898);Hemiplegia and hemiparesis ?Hemiplegia - Right/Left: Right ?Hemiplegia - dominant/non-dominant: Dominant ?Hemiplegia - caused by: Cerebral infarction ?  ? ?Time: 1205-1230 ?PT Time  Calculation (min) (ACUTE ONLY): 25 min ? ? ?Charges:   PT Evaluation ?$PT Eval Moderate Complexity: 1 Mod ?PT Treatments ?$Therapeutic Activity: 8-22 mins ?  ?   ? ? ?Magda Kiel, PT ?Acute Rehabilitation Services ?Pager

## 2022-03-07 NOTE — Progress Notes (Signed)
Inpatient Rehab Admissions Coordinator:  ? ?Per therapy recommendations,  patient was screened for CIR candidacy by Clemens Catholic, MS, CCC-SLP ?  At this time, Pt. is not medically ready for CIR as he is currently requiring a Venturi mask.  I will not pursue a rehab consult for this Pt. at this time, but CIR admissions team will follow and monitor for medical readiness and place consult order if Pt. appears to be an appropriate candidate. Please contact me with any questions.  ? ?Clemens Catholic, MS, CCC-SLP ?Rehab Admissions Coordinator  ?[phone] (celll) ?[phone] (office) ? ?

## 2022-03-08 ENCOUNTER — Inpatient Hospital Stay (HOSPITAL_COMMUNITY): Payer: Medicaid Other

## 2022-03-08 DIAGNOSIS — E440 Moderate protein-calorie malnutrition: Secondary | ICD-10-CM | POA: Diagnosis not present

## 2022-03-08 DIAGNOSIS — R569 Unspecified convulsions: Secondary | ICD-10-CM | POA: Diagnosis not present

## 2022-03-08 DIAGNOSIS — Z8673 Personal history of transient ischemic attack (TIA), and cerebral infarction without residual deficits: Secondary | ICD-10-CM | POA: Diagnosis not present

## 2022-03-08 DIAGNOSIS — J9601 Acute respiratory failure with hypoxia: Secondary | ICD-10-CM | POA: Diagnosis not present

## 2022-03-08 DIAGNOSIS — I63512 Cerebral infarction due to unspecified occlusion or stenosis of left middle cerebral artery: Secondary | ICD-10-CM | POA: Diagnosis not present

## 2022-03-08 DIAGNOSIS — I6522 Occlusion and stenosis of left carotid artery: Secondary | ICD-10-CM | POA: Diagnosis not present

## 2022-03-08 LAB — BASIC METABOLIC PANEL
Anion gap: 9 (ref 5–15)
BUN: 9 mg/dL (ref 8–23)
CO2: 30 mmol/L (ref 22–32)
Calcium: 8.6 mg/dL — ABNORMAL LOW (ref 8.9–10.3)
Chloride: 101 mmol/L (ref 98–111)
Creatinine, Ser: 0.68 mg/dL (ref 0.61–1.24)
GFR, Estimated: >60 mL/min (ref >60)
Glucose, Bld: 127 mg/dL — ABNORMAL HIGH (ref 70–99)
Potassium: 3.2 mmol/L — ABNORMAL LOW (ref 3.5–5.1)
Sodium: 140 mmol/L (ref 135–145)

## 2022-03-08 LAB — POCT I-STAT 7, (LYTES, BLD GAS, ICA,H+H)
Acid-Base Excess: 6 mmol/L — ABNORMAL HIGH (ref 0.0–2.0)
Bicarbonate: 32.8 mmol/L — ABNORMAL HIGH (ref 20.0–28.0)
Calcium, Ion: 1.16 mmol/L (ref 1.15–1.40)
HCT: 47 % (ref 39.0–52.0)
Hemoglobin: 16 g/dL (ref 13.0–17.0)
O2 Saturation: 97 %
Patient temperature: 99.3
Potassium: 3.3 mmol/L — ABNORMAL LOW (ref 3.5–5.1)
Sodium: 139 mmol/L (ref 135–145)
TCO2: 34 mmol/L — ABNORMAL HIGH (ref 22–32)
pCO2 arterial: 55.9 mmHg — ABNORMAL HIGH (ref 32–48)
pH, Arterial: 7.378 (ref 7.35–7.45)
pO2, Arterial: 98 mmHg (ref 83–108)

## 2022-03-08 LAB — CBC
HCT: 45.6 % (ref 39.0–52.0)
Hemoglobin: 15.3 g/dL (ref 13.0–17.0)
MCH: 30.2 pg (ref 26.0–34.0)
MCHC: 33.6 g/dL (ref 30.0–36.0)
MCV: 89.9 fL (ref 80.0–100.0)
Platelets: 149 10*3/uL — ABNORMAL LOW (ref 150–400)
RBC: 5.07 MIL/uL (ref 4.22–5.81)
RDW: 12.8 % (ref 11.5–15.5)
WBC: 7.3 10*3/uL (ref 4.0–10.5)
nRBC: 0 % (ref 0.0–0.2)

## 2022-03-08 LAB — GLUCOSE, CAPILLARY
Glucose-Capillary: 130 mg/dL — ABNORMAL HIGH (ref 70–99)
Glucose-Capillary: 115 mg/dL — ABNORMAL HIGH (ref 70–99)
Glucose-Capillary: 142 mg/dL — ABNORMAL HIGH (ref 70–99)
Glucose-Capillary: 112 mg/dL — ABNORMAL HIGH (ref 70–99)
Glucose-Capillary: 133 mg/dL — ABNORMAL HIGH (ref 70–99)
Glucose-Capillary: 115 mg/dL — ABNORMAL HIGH (ref 70–99)

## 2022-03-08 IMAGING — DX DG CHEST 1V PORT
1 series · 1 of 1 positions shown · non-contrast
Comparison: [DATE]

CLINICAL DATA: Seizures.

EXAM:
PORTABLE CHEST 1 VIEW

[chest]
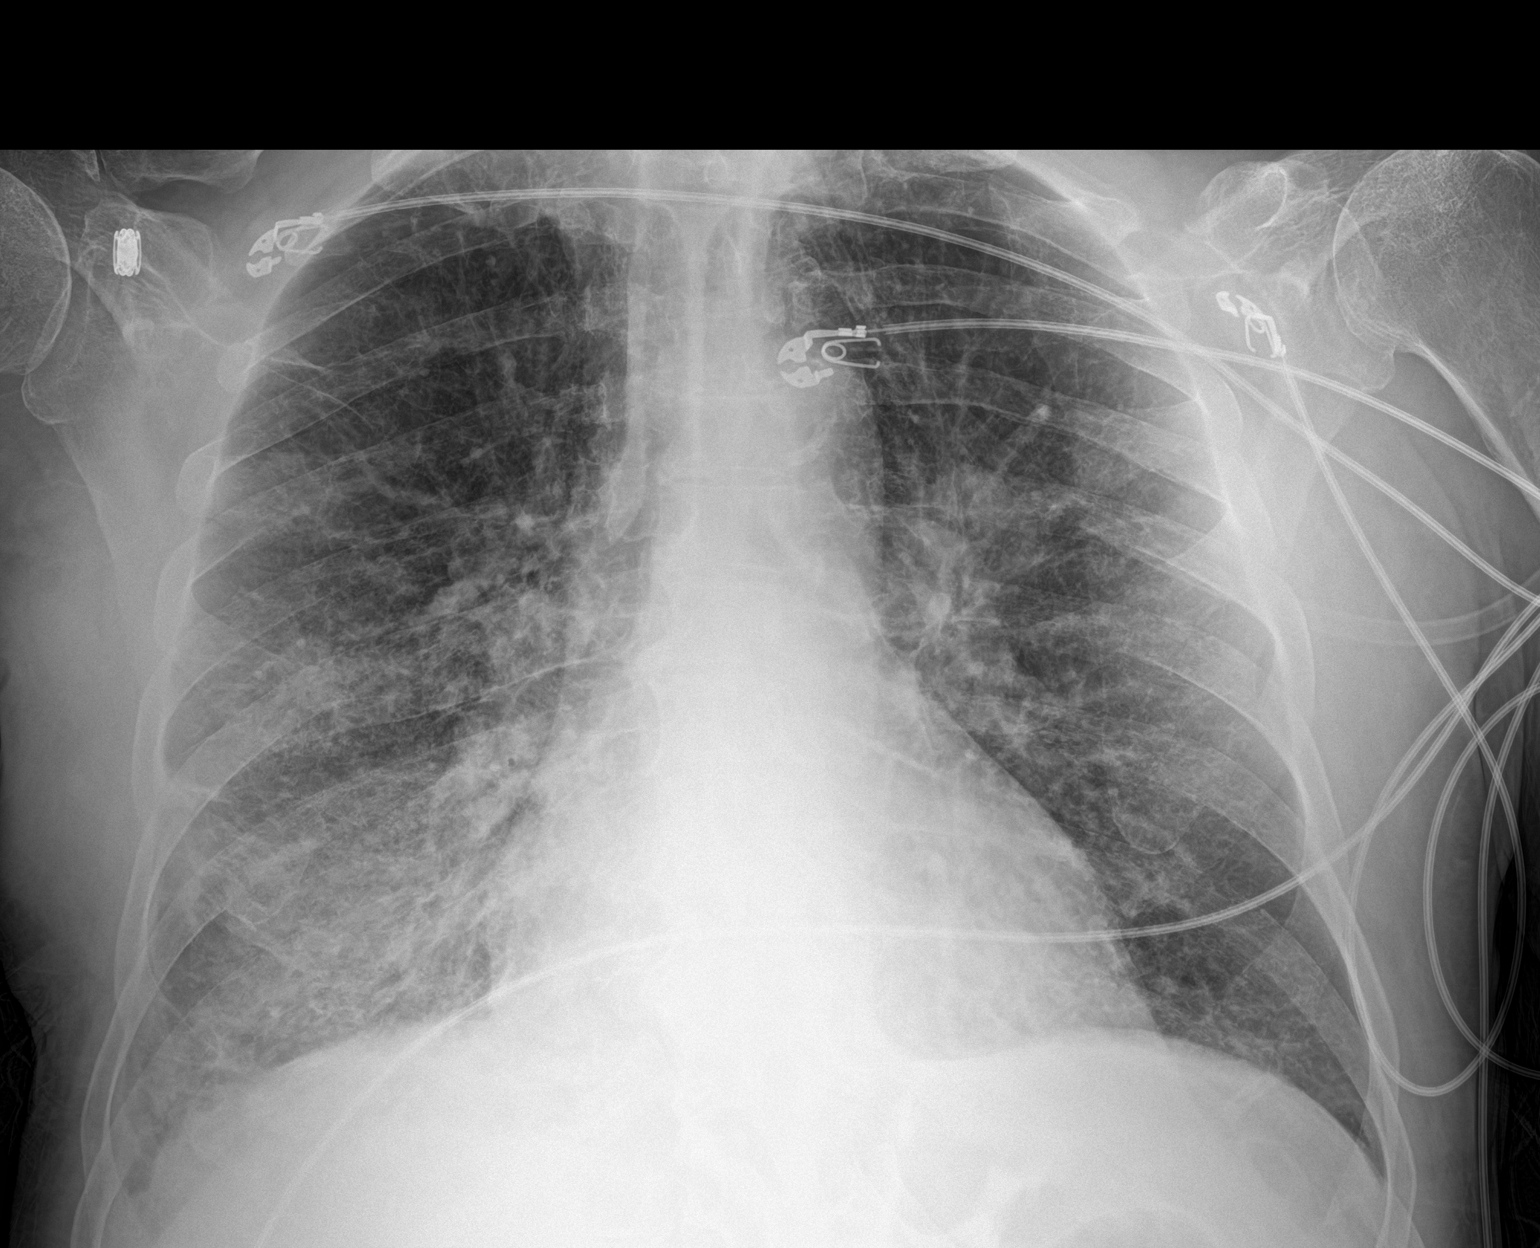

[1 of 1 positions shown; findings below may reference images not displayed]

FINDINGS: Lordotic positioning noted. Heart size is within normal limits.
Worsening airspace disease is seen in the right lower lung.
Previously noted nodular opacities in the left suprahilar region and
right midlung are again noted.
IMPRESSION: Worsening airspace disease in right lower lung.

Persistent nodular opacities in left suprahilar region and right
midlung.

## 2022-03-08 IMAGING — DX DG CHEST 1V PORT
1 series · 2 of 2 positions shown · non-contrast
Comparison: [DATE]

CLINICAL DATA: Endotracheal tube placement

EXAM:
PORTABLE CHEST 1 VIEW

[Series 1: chest · 0.14mm/px · 2 of 2 slices shown]
[im 1/2]
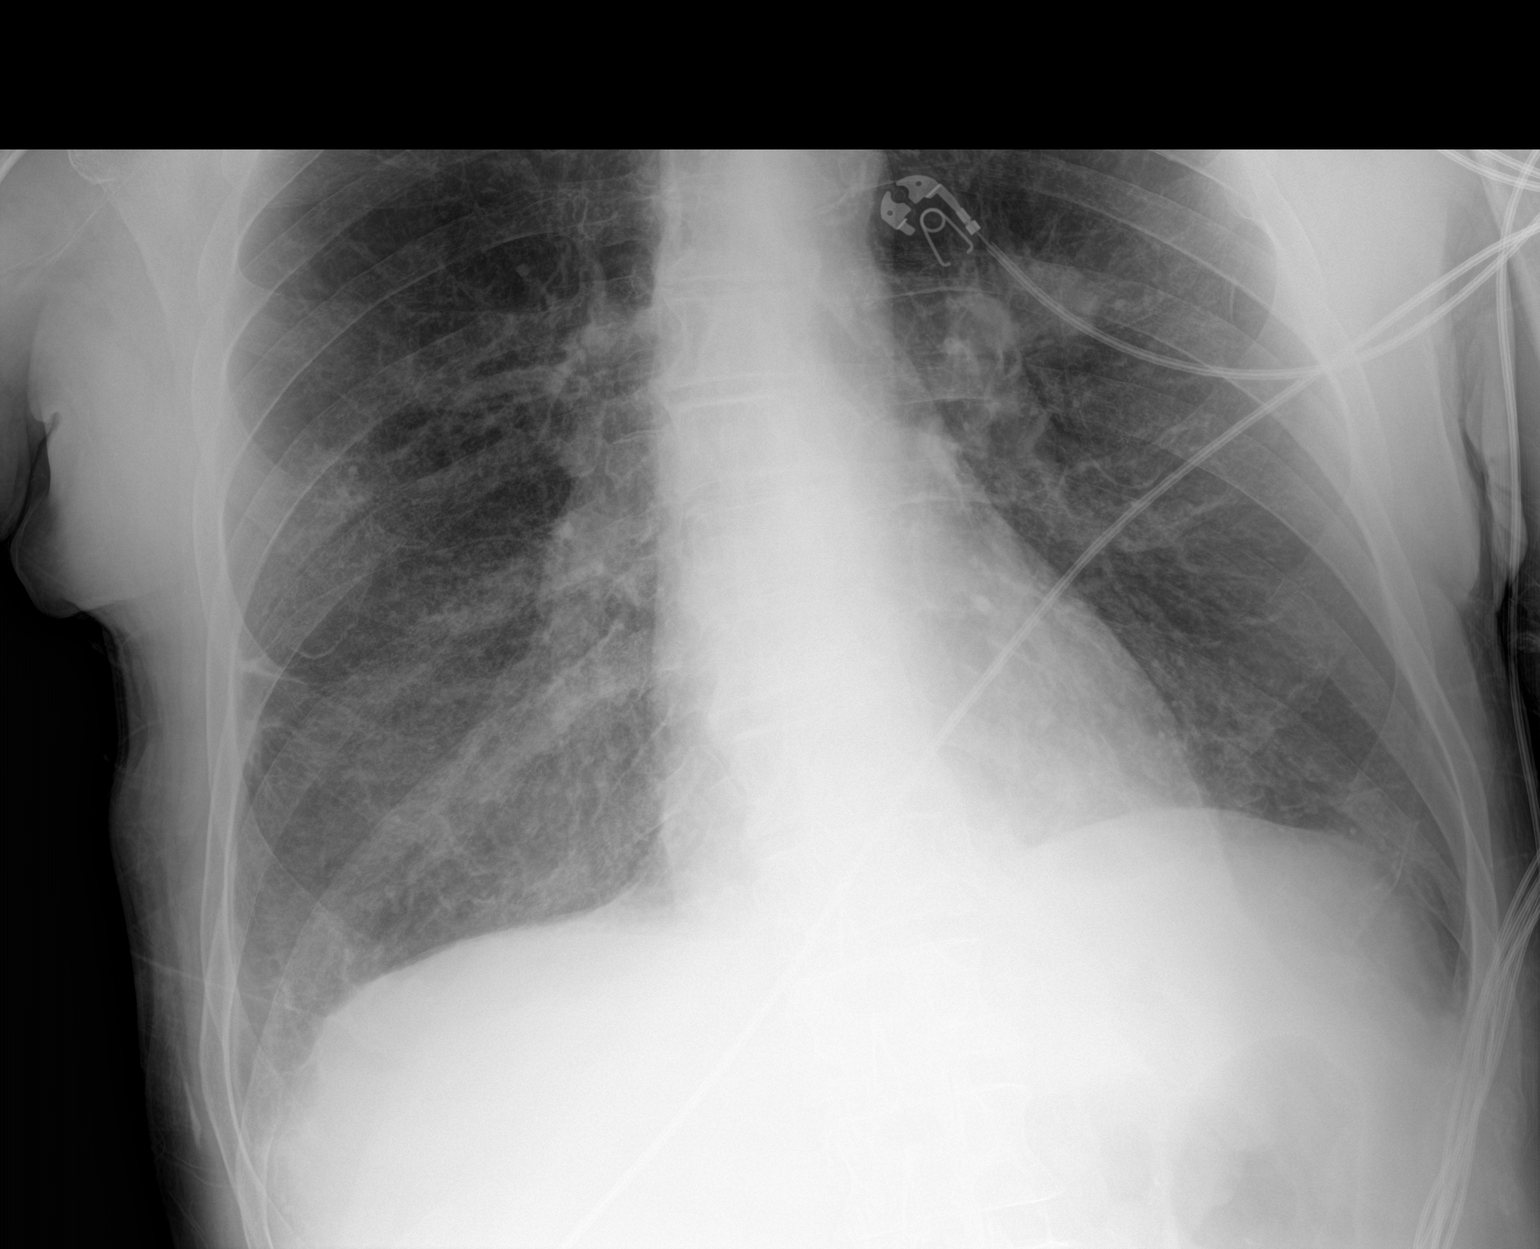
[im 2/2]
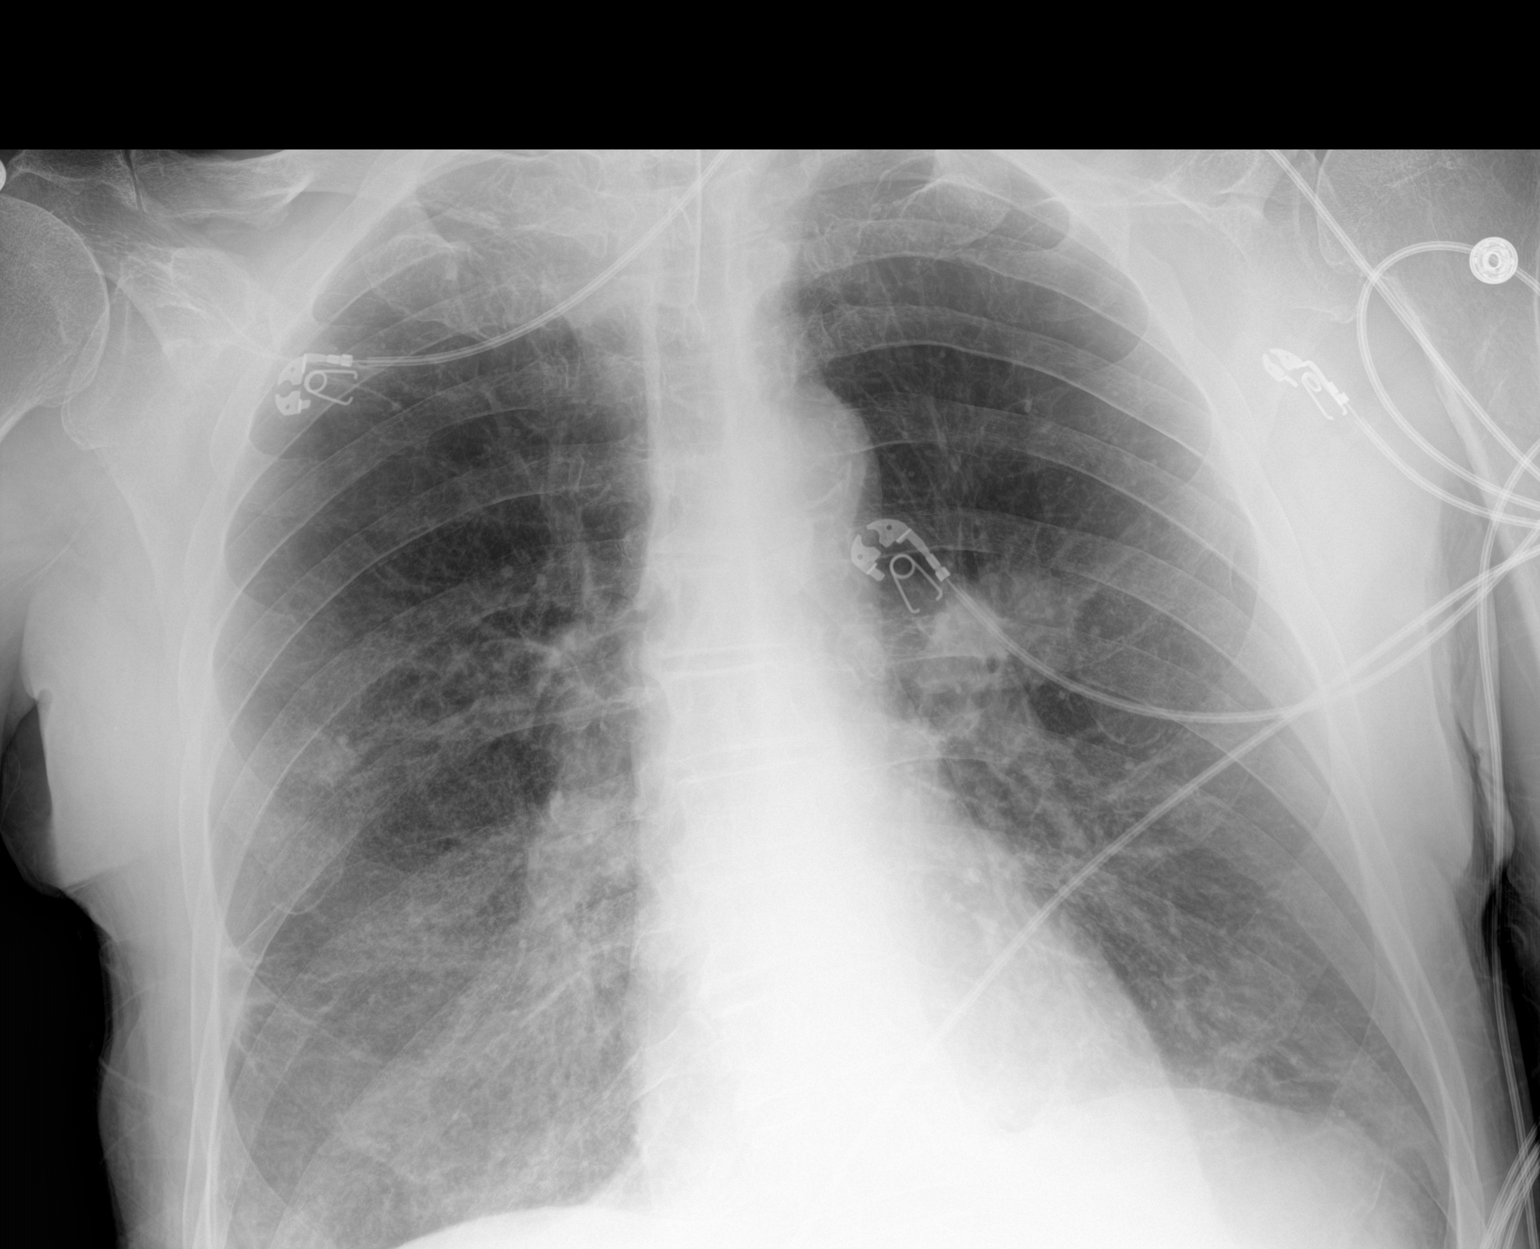

[2 of 2 positions shown; findings below may reference images not displayed]

FINDINGS: Endotracheal tube in good position mid trachea.

COPD. Diffuse bilateral airspace disease with interval improvement.
Small bilateral effusions unchanged.
IMPRESSION: Endotracheal tube in good position

Diffuse bilateral airspace disease with interval improvement.

## 2022-03-08 MED ORDER — OSMOLITE 1.5 CAL PO LIQD
1000.0000 mL | ORAL | Status: DC
  Administered 2022-03-11 – 2022-03-22 (×9): 1000 mL
  Filled 2022-03-08 (×3): qty 1000

## 2022-03-08 MED ORDER — FENTANYL CITRATE PF 50 MCG/ML IJ SOSY
50.0000 ug | PREFILLED_SYRINGE | INTRAMUSCULAR | Status: DC | PRN
  Administered 2022-03-08: 100 ug via INTRAVENOUS
  Filled 2022-03-08: qty 2

## 2022-03-08 MED ORDER — MIDAZOLAM HCL 2 MG/2ML IJ SOLN
INTRAMUSCULAR | Status: AC
  Filled 2022-03-08: qty 2

## 2022-03-08 MED ORDER — FENTANYL CITRATE PF 50 MCG/ML IJ SOSY
50.0000 ug | PREFILLED_SYRINGE | INTRAMUSCULAR | Status: DC | PRN
  Administered 2022-03-11: 50 ug via INTRAVENOUS
  Filled 2022-03-08: qty 1

## 2022-03-08 MED ORDER — FUROSEMIDE 10 MG/ML IJ SOLN
40.0000 mg | Freq: Once | INTRAMUSCULAR | Status: AC
  Administered 2022-03-08: 40 mg via INTRAVENOUS
  Filled 2022-03-08: qty 4

## 2022-03-08 MED ORDER — SODIUM CHLORIDE 0.9 % IV SOLN: INTRAVENOUS | Status: DC | PRN

## 2022-03-08 MED ORDER — POTASSIUM CHLORIDE 10 MEQ/100ML IV SOLN: 10.0000 meq | INTRAVENOUS | Status: DC

## 2022-03-08 MED ORDER — ORAL CARE MOUTH RINSE
15.0000 mL | OROMUCOSAL | Status: DC
  Administered 2022-03-08 – 2022-03-14 (×60): 15 mL via OROMUCOSAL

## 2022-03-08 MED ORDER — NICOTINE 21 MG/24HR TD PT24
21.0000 mg | MEDICATED_PATCH | Freq: Every day | TRANSDERMAL | Status: DC
  Administered 2022-03-08 – 2022-03-17 (×10): 21 mg via TRANSDERMAL
  Filled 2022-03-08 (×10): qty 1

## 2022-03-08 MED ORDER — IPRATROPIUM-ALBUTEROL 0.5-2.5 (3) MG/3ML IN SOLN
3.0000 mL | Freq: Three times a day (TID) | RESPIRATORY_TRACT | Status: DC
  Administered 2022-03-08 – 2022-03-13 (×15): 3 mL via RESPIRATORY_TRACT
  Filled 2022-03-08 (×14): qty 3

## 2022-03-08 MED ORDER — POTASSIUM CHLORIDE 10 MEQ/100ML IV SOLN
10.0000 meq | INTRAVENOUS | Status: AC
  Administered 2022-03-08 (×4): 10 meq via INTRAVENOUS
  Filled 2022-03-08 (×4): qty 100

## 2022-03-08 MED ORDER — ETOMIDATE 2 MG/ML IV SOLN: 20.0000 mg | Freq: Once | INTRAVENOUS | Status: AC

## 2022-03-08 MED ORDER — CHLORHEXIDINE GLUCONATE 0.12% ORAL RINSE (MEDLINE KIT)
15.0000 mL | Freq: Two times a day (BID) | OROMUCOSAL | Status: DC
  Administered 2022-03-08 – 2022-03-22 (×28): 15 mL via OROMUCOSAL

## 2022-03-08 MED ORDER — POLYETHYLENE GLYCOL 3350 17 G PO PACK
17.0000 g | PACK | Freq: Every day | ORAL | Status: DC
  Administered 2022-03-12 – 2022-03-13 (×2): 17 g
  Filled 2022-03-08 (×3): qty 1

## 2022-03-08 MED ORDER — PROSOURCE TF PO LIQD
45.0000 mL | Freq: Four times a day (QID) | ORAL | Status: DC
Start: 2022-03-09 — End: 2022-03-10

## 2022-03-08 MED ORDER — FENTANYL CITRATE PF 50 MCG/ML IJ SOSY
PREFILLED_SYRINGE | INTRAMUSCULAR | Status: DC
Start: 2022-03-08 — End: 2022-03-08
  Filled 2022-03-08: qty 2

## 2022-03-08 MED ORDER — ROCURONIUM BROMIDE 10 MG/ML (PF) SYRINGE: 100.0000 mg | PREFILLED_SYRINGE | Freq: Once | INTRAVENOUS | Status: AC

## 2022-03-08 MED ORDER — ROCURONIUM BROMIDE 10 MG/ML (PF) SYRINGE
PREFILLED_SYRINGE | INTRAVENOUS | Status: AC
  Administered 2022-03-08: 100 mg via INTRAVENOUS
  Filled 2022-03-08: qty 10

## 2022-03-08 MED ORDER — DOCUSATE SODIUM 50 MG/5ML PO LIQD
100.0000 mg | Freq: Two times a day (BID) | ORAL | Status: DC
  Filled 2022-03-08: qty 10

## 2022-03-08 MED ORDER — PROPOFOL 1000 MG/100ML IV EMUL
0.0000 ug/kg/min | INTRAVENOUS | Status: DC
  Administered 2022-03-08: 10 ug/kg/min via INTRAVENOUS
  Administered 2022-03-09: 20 ug/kg/min via INTRAVENOUS
  Administered 2022-03-09 (×2): 15 ug/kg/min via INTRAVENOUS
  Administered 2022-03-10: 20 ug/kg/min via INTRAVENOUS
  Administered 2022-03-10: 5 ug/kg/min via INTRAVENOUS
  Administered 2022-03-11: 10 ug/kg/min via INTRAVENOUS
  Administered 2022-03-11 (×2): 20 ug/kg/min via INTRAVENOUS
  Filled 2022-03-08 (×7): qty 100

## 2022-03-08 MED ORDER — PANTOPRAZOLE SODIUM 40 MG IV SOLR
40.0000 mg | INTRAVENOUS | Status: DC
Start: 2022-03-08 — End: 2022-03-13
  Administered 2022-03-08 – 2022-03-12 (×5): 40 mg via INTRAVENOUS
  Filled 2022-03-08 (×5): qty 10

## 2022-03-08 MED ORDER — ETOMIDATE 2 MG/ML IV SOLN
INTRAVENOUS | Status: AC
  Administered 2022-03-08: 20 mg via INTRAVENOUS
  Filled 2022-03-08: qty 20

## 2022-03-08 NOTE — Procedures (Signed)
Intubation Procedure Note ? ?Todd Estrada  ?[id number]  ?01-Mar-1961 ? ?Date:03/08/22  ?Time:6:47 PM  ? ?Provider Performing:Etan Vasudevan  ? ? ?Procedure: Intubation (74163) ? ?Indication(s) ?Respiratory Failure ? ?Consent ?Risks of the procedure as well as the alternatives and risks of each were explained to the patient and/or caregiver.  Consent for the procedure was obtained and is signed in the bedside chart ? ? ?Anesthesia ?Etomidate and Rocuronium ? ? ?Time Out ?Verified patient identification, verified procedure, site/side was marked, verified correct patient position, special equipment/implants available, medications/allergies/relevant history reviewed, required imaging and test results available. ? ? ?Sterile Technique ?Usual hand hygeine, masks, and gloves were used ? ? ?Procedure Description ?Patient positioned in bed supine.  Sedation given as noted above.  Patient was intubated with endotracheal tube using  DL 6 .  View was Grade 3 only epiglottis .  Number of attempts was 1.  Colorimetric CO2 detector was consistent with tracheal placement. ? ? ?Complications/Tolerance ?None; patient tolerated the procedure well. ?Chest X-ray is ordered to verify placement. ? ? ?EBL ?Minimal ? ? ?Specimen(s) ?None ? ?

## 2022-03-08 NOTE — Evaluation (Addendum)
Clinical/Bedside Swallow Evaluation ?Patient Details  ?Name: Todd Estrada ?MRN: [id number] ?Date of Birth: July 13, 1961 ? ?Today's Date: 03/08/2022 ?Time: SLP Start Time (ACUTE ONLY): 5784 SLP Stop Time (ACUTE ONLY): 1125 ?SLP Time Calculation (min) (ACUTE ONLY): 20 min ? ?Past Medical History:  ?Past Medical History:  ?Diagnosis Date  ? Hypertension   ? ?Past Surgical History:  ?Past Surgical History:  ?Procedure Laterality Date  ? IR ANGIO INTRA EXTRACRAN SEL COM CAROTID INNOMINATE UNI R MOD SED  03/02/2022  ? IR CT HEAD LTD  03/02/2022  ? IR PERCUTANEOUS ART THROMBECTOMY/INFUSION INTRACRANIAL INC DIAG ANGIO  03/02/2022  ? RADIOLOGY WITH ANESTHESIA N/A 03/02/2022  ? Procedure: IR WITH ANESTHESIA;  Surgeon: Luanne Bras, MD;  Location: [facility];  Service: Radiology;  Laterality: N/A;  ? ?HPI:  ?Pt is a 61 y.o. male who presented after a witnessed seizure. He was noted to be postictal in the ED along with weak in RUE and RLE, aphasia, and slurred speech. CT head negative for acute changes. CTA 3/15: Occlusion of the left ICA, from just distal to the bifurcation through the supraclinoid segment. Pt s/p  endovascular complete revascularization of occluded left ICA, left middle cerebral M1 segment. 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  ?  ?Assessment / Plan / Recommendation  ?Clinical Impression ? Pt seen at bedside for limited assessment of PO readiness. Pt was lethargic, unable to follow commands well. His voice quality was intermittently wet, with congested cough frequently noted. Oral care was completed with suction. Bright red blood noted on tip of [place]. RN informed. Pt continues to require venturi mask for O2 support. He did not accept an individual ice chip. He would not close his lips on the ice, or lick the water from his lips. SLP will continue to assess for PO readiness. Per RN, pt is having Cortrak placed  soon. SLE not completed this date. Pt has a history of aphasia and dysarthria per 08/2021 SLE. ? ?SLP Visit Diagnosis: Dysphagia, unspecified (R13.10) ?   ?Aspiration Risk ? Severe aspiration risk;Risk for inadequate nutrition/hydration  ?  ?Diet Recommendation NPO  ? ?Medication Administration: Via alternative means  ?  ?Other  Recommendations Oral Care Recommendations: Oral care prior to ice chip/H20 ?Other Recommendations: Have oral suction available   ? ?Recommendations for follow up therapy are one component of a multi-disciplinary discharge planning process, led by the attending physician.  Recommendations may be updated based on patient status, additional functional criteria and insurance authorization. ? ?Follow up Recommendations  (TBD)  ? ? ?  ?Assistance Recommended at Discharge  (TBD)  ?Frequency and Duration min 1 x/week  ?2 weeks ?  ?   ? ?Prognosis Prognosis for Safe Diet Advancement: Fair ?Barriers to Reach Goals: Language deficits;Severity of deficits  ? ?  ? ?Swallow Study   ?General Date of Onset: 03/03/22 ?HPI: Pt is a 61 y.o. male who presented after a witnessed seizure. He was noted to be postictal in the ED along with weak in RUE and RLE, aphasia, and slurred speech. CT head negative for acute changes. CTA 3/15: Occlusion of the left ICA, from just distal to the bifurcation through the supraclinoid segment. Pt s/p  endovascular complete revascularization of occluded left ICA, left middle cerebral M1 segment. PMH: HTN, prior L MCA stroke in August, 9528 complicated by petechial hemorrhage with mild to moderate residual aphasia, R sided weakness. ?Type of Study:  Bedside Swallow Evaluation ?Previous Swallow Assessment: none ?Diet Prior to this Study: NPO ?Temperature Spikes Noted: No ?Respiratory Status: Venti-mask ?History of Recent Intubation: Yes ?Length of Intubations (days): 3 days ?Date extubated: 03/05/22 ?Behavior/Cognition: Lethargic/Drowsy;Distractible;Requires cueing;Doesn't follow  directions ?Oral Cavity Assessment: Other (comment) (dried blood on hard palate) ?Oral Care Completed by SLP: Yes ?Self-Feeding Abilities: Total assist ?Patient Positioning: Upright in bed ?Baseline Vocal Quality: Wet ?Volitional Cough: Cognitively unable to elicit ?Volitional Swallow: Unable to elicit  ?  ?Oral/Motor/Sensory Function Overall Oral Motor/Sensory Function: Other (comment) (unable to assess)   ?Ice Chips Ice chips: Impaired ?Oral Phase Impairments: Reduced labial seal;Poor awareness of bolus ?Other Comments: no attempt to take ice chip   ?Thin Liquid Thin Liquid: Not tested  ?  ?Nectar Thick Nectar Thick Liquid: Not tested   ?Honey Thick Honey Thick Liquid: Not tested   ?Puree Puree: Not tested   ?Solid ? ? ?  Solid: Not tested  ? ?  ?Amayiah Gosnell B. Mardel Grudzien, MSP, CCC-SLP ?Speech Language Pathologist ?Office: [phone] ? ?Shonna Chock ?03/08/2022,11:37 AM ? ? ? ?

## 2022-03-08 NOTE — Progress Notes (Signed)
eLink Physician-Brief Progress Note ?Patient Name: JONN CHAIKIN ?DOB: [birth year] ?MRN: [id number] ? ? ?Date of Service ? 03/08/2022  ?HPI/Events of Note ? Patient back on mechanical ventilation. Notified of need for stress ulcer prophylaxis.   ?eICU Interventions ? Will order Protonix 40 mg IV now and Q day.   ? ? ? ?Intervention Category ?Major Interventions: Other: ? ?Frederich Montilla Cornelia Copa ?03/08/2022, 9:13 PM ?

## 2022-03-08 NOTE — Progress Notes (Addendum)
Nutrition Follow-up ? ?DOCUMENTATION CODES:  ? ?Non-severe (moderate) malnutrition in context of chronic illness ? ?INTERVENTION:  ? ?Once Cortrak placed on 3/22 and placement is confirmed, initiate tube feeds: ?- Start Osmolite 1.5 @ 25 ml/hr and advance by 10 ml q 6 hours to goal rate of 55 ml/hr (1320 ml/day) ?- ProSource TF 45 ml QID ? ?Tube feeding regimen at goal rate provides 2140 kcal, 133 grams of protein, and 1008 ml of H2O.  ? ?- MVI with minerals daily per tube ? ?NUTRITION DIAGNOSIS:  ? ?Moderate Malnutrition related to chronic illness (L MCA stroke) as evidenced by moderate fat depletion, moderate muscle depletion. ? ?Ongoing, being addressed via initiation of tube feeds on 3/22 ? ?GOAL:  ? ?Patient will meet greater than or equal to 90% of their needs ? ?Unmet at this time, plan to resume tube feeds once access obtained on 3/22 ? ?MONITOR:  ? ?TF tolerance ? ?REASON FOR ASSESSMENT:  ? ?Consult, Ventilator ?Enteral/tube feeding initiation and management ? ?ASSESSMENT:  ? ?Pt with PMH of HTN, CHF, L MCA stroke 2022 with mild to moderate aphasia and R sided weakness now admitted with L MCA due to L ICA occlusion and seizure-like activity. ? ?03/18 - extubated, later reintubated ?03/19 - pt self-extubated, NG tube placement attempted but unsuccessful, R nare epistaxis, rhino rocket placed ?03/20 - Cortrak not attempted due to rhino rocket ? ?Discussed pt with RN. Plan is for Cortrak placement tomorrow, 3/22. Pt remains NPO with no enteral access at this time. RD to order tube feeding orders to start tomorrow. RN aware of plan. ? ?Admit weight: 87.9 kg ?Current weight: 83.7 kg ? ?Pt with non-pitting edema to RUE. ? ?Medications reviewed and include: dulcolax suppository, folic acid, IV lasix 40 mg once, MVI with minerals daily, phenobarbital taper, thiamine, IV abx, IV KCl 10 mEq x 3 runs ? ?Labs reviewed: potassium 3.2 ?CBG's: 115-135 x 24 hours ? ?UOP: 5825 ml x 24 hours ?I/O's: +1.1 L since  admit ? ?Diet Order:   ?Diet Order   ? ? None  ? ?  ? ? ?EDUCATION NEEDS:  ? ?Not appropriate for education at this time ? ?Skin:  Skin Assessment: ?Skin Integrity Issues: ?Stage II: R buttocks ? ?Last BM:  03/08/22 ? ?Height:  ? ?Ht Readings from Last 1 Encounters:  ?03/05/22 6' (1.829 m)  ? ? ?Weight:  ? ?Wt Readings from Last 1 Encounters:  ?03/08/22 83.7 kg  ? ? ?BMI:  Body mass index is 25.03 kg/m?. ? ?Estimated Nutritional Needs:  ? ?Kcal:  2100-2300 ? ?Protein:  125-140 grams ? ?Fluid:  > 2 L/day ? ? ? ?Gustavus Bryant, MS, RD, LDN ?Inpatient Clinical Dietitian ?Please see AMiON for contact information. ? ?

## 2022-03-08 NOTE — Progress Notes (Signed)
? ?NAME:  Todd Estrada, MRN:  [id number], DOB:  Sep 24, 1961, LOS: 6 ?ADMISSION DATE:  03/02/2022, CONSULTATION DATE:  3/16 ?REFERRING MD:  Erlinda Hong, CHIEF COMPLAINT:  Confusion, seizure  ? ?History of Present Illness:  ?61 y/o male with a history of Left MCA stroke in 2585 with a complicated post stroke course presented on 3/15 with a 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  Intubated on 3/16 for airway protection. ? ?Pertinent  Medical History  ?Hyeprtension ?Left MCA stroke ?Chronic combined CHF ? ?Significant Hospital Events: ?Including procedures, antibiotic start and stop dates in addition to other pertinent events   ?3/15 admitted for acute L MCA stroke due to L ICA occlusion with first time seizure s/p revascularization of L ICA occlusion ?3/16 Intubated for airway protection; started unasyn; echo LVEF 40-45%, RVSP normal ?3/17 MRI Brain showed acute infarct in left frontal, temporal and parietal lobes with findings worrisome for acute infarct, left vert occluded; CT head did not show hemorrhage; respiratory notes thick secretions,  ?3/18 following some commands on exam, extubated the re-intubated due to hypoxemia, inability to clear secretions ?3/19 self extubated overnight, remains on NRB. Rhino rocket placed for R nare epistaxis  ? ?Interim History / Subjective:  ?Patient remains on venti mask this morning. Not clearing secretions.  ?Minimally responsive to painful stimuli  ?  ?Objective   ?Blood pressure (!) 154/80, pulse 61, temperature 98.5 ?F (36.9 ?C), temperature source Axillary, resp. rate 20, height 6' (1.829 m), weight 83.7 kg, SpO2 96 %. ?   ?FiO2 (%):  [40 %-60 %] 45 %  ? ?Intake/Output Summary (Last 24 hours) at 03/08/2022 0744 ?Last data filed at 03/08/2022 0700 ?Gross per 24 hour  ?Intake 2203.9 ml  ?Output 5825 ml  ?Net -3621.1 ml  ? ?Filed Weights  ? 03/03/22 1000 03/07/22 0500 03/08/22 0325  ?Weight: 84.7 kg 85.3 kg 83.7 kg  ? ? ?Examination: ? General:  chronically ill  appearing and unkept elderly male, somnolent ?HENT: NCAT bloody nares; rhinorocket in place R nare ?PULM: Diffuse rhonchi; no wheezing noted  ?CV: RRR, no mgr ?GI: BS+, soft, nontender ?MSK: normal bulk and tone ?Neuro: somnolent but arousable to painful stimuli intermittently; PERRL; no gag reflex ? ?Resolved Hospital Problem list   ?Septic shock due to aspiration pneumonia> resolved ? ?Assessment & Plan:  ?Acute encephalopathy due to left MCA stroke from left ICA occlusion requiring thrombectomy and stent placement on 3/15 ?Seizure disorder ?Agitation delirium; possible ETOH withdrawal  ?- Continue Keppra ?- Continue phenobarbital x6days  ?- Continue to monitor neuro exam ?- Secondary stroke prevention per neurology/stroke service ?- BP goals and glucose goals per stroke service ?- Continue statin and aspirin; unable to take PO brilinta at this time  ?- PT/OT/SLP ?- Cautiously use precedex for sedation, monitor for bradycardia ? ?Acute hypoxemic respiratory failure due to aspiration pneumonia  ?Thick pulmonary secretions ?Concern for emphysema/COPD  ?Aspiration risk ?High risk for intubation; will need trach. Will discuss with family for consent. ?- Continue Unasyn  ?- Continue hypertonic saline and guaifenesin and duonebs  ?- Oxygen supplement for goal SpO2 88-92% ?- NPO, aspiration precautions ?- Will need cortrak placement on wed ? ?Combined systolic and diastolic heart failure, chronic ?Hypertension ?Continue to monitor hemodynamics ?Add hydralazine prn SBP > 160 ? ?Best Practice (right click and "Reselect all SmartList Selections" daily)  ? ?Diet/type: NPO ?DVT prophylaxis: LMWH ?GI prophylaxis: PPI ?Lines: N/A ?Foley:  N/A ?Code Status:  full code ?Last date of multidisciplinary  goals of care discussion [per stroke service] ? ?Labs   ?CBC: ?Recent Labs  ?Lab 03/02/22 ?1813 03/02/22 ?1822 03/03/22 ?0430 03/03/22 ?1152 03/04/22 ?5329 03/05/22 ?9242 03/06/22 ?6834 03/06/22 ?0630 03/07/22 ?1962 03/08/22 ?2297   ?WBC 9.0   < > 9.7   < > 11.1* 7.6 7.8  --  9.4 7.3  ?NEUTROABS 7.0  --  6.9  --   --   --   --   --   --   --   ?HGB 17.3*   < > 15.2   < > 14.7 12.8* 12.9* 12.9* 14.1 15.3  ?HCT 52.2*   < > 45.5   < > 46.0 39.0 40.0 38.0* 43.1 45.6  ?MCV 92.7   < > 91.5   < > 93.9 93.5 95.9  --  92.9 89.9  ?PLT 195   < > 166   < > 169 129* 143*  --  138* 149*  ? < > = values in this interval not displayed.  ? ? ?Basic Metabolic Panel: ?Recent Labs  ?Lab 03/03/22 ?1410 03/03/22 ?1423 03/03/22 ?1638 03/04/22 ?9892 03/04/22 ?1857 03/05/22 ?1194 03/06/22 ?1740 03/06/22 ?0630 03/07/22 ?8144 03/08/22 ?8185  ?NA 140   < >  --  140  --  141 141 142 139 140  ?K 3.1*   < >  --  4.3  --  3.7 4.0 4.4 4.1 3.2*  ?CL 103  --   --  102  --  104 104  --  103 101  ?CO2 28  --   --  29  --  29 29  --  28 30  ?GLUCOSE 144*  --   --  110*  --  98 114*  --  126* 127*  ?BUN 6*  --   --  8  --  14 13  --  9 9  ?CREATININE 0.78  --   --  0.74  --  0.76 0.78  --  0.66 0.68  ?CALCIUM 9.1  --   --  8.6*  --  8.5* 8.5*  --  8.7* 8.6*  ?MG 1.4*  --  1.7 2.6* 2.3  --   --   --   --   --   ?PHOS 4.4  --  4.0 4.3 3.9  --   --   --   --   --   ? < > = values in this interval not displayed.  ? ?GFR: ?Estimated Creatinine Clearance: 106.4 mL/min (by C-G formula based on SCr of 0.68 mg/dL). ?Recent Labs  ?Lab 03/03/22 ?1410 03/04/22 ?6314 03/05/22 ?9702 03/06/22 ?6378 03/07/22 ?5885 03/08/22 ?0277  ?WBC 8.1   < > 7.6 7.8 9.4 7.3  ?LATICACIDVEN 1.4  --   --   --   --   --   ? < > = values in this interval not displayed.  ? ? ?Liver Function Tests: ?Recent Labs  ?Lab 03/02/22 ?1813  ?AST 31  ?ALT 22  ?ALKPHOS 58  ?BILITOT 1.4*  ?PROT 6.3*  ?ALBUMIN 3.6  ? ?No results for input(s): LIPASE, AMYLASE in the last 168 hours. ?No results for input(s): AMMONIA in the last 168 hours. ? ?ABG ?   ?Component Value Date/Time  ? PHART 7.359 03/06/2022 0630  ? PCO2ART 58.4 (H) 03/06/2022 0630  ? PO2ART 168 (H) 03/06/2022 0630  ? HCO3 33.0 (H) 03/06/2022 0630  ? TCO2 35 (H)  03/06/2022 0630  ? O2SAT 99 03/06/2022 0630  ?  ? ?Coagulation Profile: ?  No results for input(s): INR, PROTIME in the last 168 hours. ? ?Cardiac Enzymes: ?No results for input(s): CKTOTAL, CKMB, CKMBINDEX, TROPONINI in the last 168 hours. ? ?HbA1C: ?Hgb A1c MFr Bld  ?Date/Time Value Ref Range Status  ?03/03/2022 04:30 AM 5.4 4.8 - 5.6 % Final  ?  Comment:  ?  (NOTE) ?        Prediabetes: 5.7 - 6.4 ?        Diabetes: >6.4 ?        Glycemic control for adults with diabetes: <7.0 ?  ?08/19/2021 07:49 AM 5.5 4.8 - 5.6 % Final  ?  Comment:  ?  (NOTE) ?Pre diabetes:          5.7%-6.4% ? ?Diabetes:              >6.4% ? ?Glycemic control for   <7.0% ?adults with diabetes ?  ? ? ?CBG: ?Recent Labs  ?Lab 03/07/22 ?1534 03/07/22 ?1919 03/07/22 ?2317 03/08/22 ?0320 03/08/22 ?0397  ?GLUCAP 125* 123* 135* 130* 115*  ? ?Critical care time:  ?  ? ? ? ? ? ? ?

## 2022-03-08 NOTE — Progress Notes (Signed)
Physical Therapy Treatment ?Patient Details ?Name: Todd Estrada ?MRN: [id number] ?DOB: [birth year] ?Today's Date: 03/08/2022 ? ? ?History of Present Illness 61 y/o male with a history of Left MCA stroke in 3474 with a complicated post stroke course presented on 3/15 with a 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  He was extubated following procedure, but reintubated due to respiratory failure.  Self extubated overnight 3/19, now on venturi mask. ? ?  ?PT Comments  ? ? Patient progressing with mobility able to transfer OOB to chair.  More alert with sedation lowered and already attempting OOB prior to PT arrival still needing posey and wrist restraint for safety.  Patient maintaining vital on 35% venimask and able to use R LE for transfer, though limited strength and mostly inattention.  Hard to determine how much of deficits are pre-existing.  PT will continue to follow acutely.  Follow up acute inpatient rehab recommended.    ?Recommendations for follow up therapy are one component of a multi-disciplinary discharge planning process, led by the attending physician.  Recommendations may be updated based on patient status, additional functional criteria and insurance authorization. ? ?Follow Up Recommendations ? Acute inpatient rehab (3hours/day) ?  ?  ?Assistance Recommended at Discharge Frequent or constant Supervision/Assistance  ?Patient can return home with the following A lot of help with bathing/dressing/bathroom;Direct supervision/assist for medications management;Assist for transportation;Help with stairs or ramp for entrance;Two people to help with walking and/or transfers ?  ?Equipment Recommendations ? Other (comment)  ?  ?Recommendations for Other Services Rehab consult ? ? ?  ?Precautions / Restrictions Precautions ?Precautions: Fall ?Precaution Comments: mitts, posey, wrist restraints, R inattention  ?  ? ?Mobility ? Bed Mobility ?Overal bed mobility: Needs Assistance ?Bed  Mobility: Supine to Sit ?  ?  ?Supine to sit: Mod assist ?  ?  ?General bed mobility comments: already sitting up with trunk and L foot on the floor despite posey and L wrist restraint; assist to bring R hip and trunk forward and to bring R foot to the floor and for balance as leaning R ?  ? ?Transfers ?Overall transfer level: Needs assistance ?Equipment used: None ?Transfers: Bed to chair/wheelchair/BSC ?  ?  ?  ?Squat pivot transfers: Mod assist, +2 safety/equipment ?  ?  ?General transfer comment: up to his feet, but remained flexed and pivoted wtih L foot closer to chair, but left R foot behind; +2 for safety with lines and for lifting off bed ?  ? ?Ambulation/Gait ?  ?  ?  ?  ?  ?  ?  ?  ? ? ?Stairs ?  ?  ?  ?  ?  ? ? ?Wheelchair Mobility ?  ? ?Modified Rankin (Stroke Patients Only) ?Modified Rankin (Stroke Patients Only) ?Pre-Morbid Rankin Score: Slight disability ?Modified Rankin: Severe disability ? ? ?  ?Balance Overall balance assessment: Needs assistance ?Sitting-balance support: Feet supported ?Sitting balance-Leahy Scale: Poor ?Sitting balance - Comments: leaning forward and to R ?  ?Standing balance support: Single extremity supported ?Standing balance-Leahy Scale: Poor ?Standing balance comment: min/mod A for pivotal turn to recliner ?  ?  ?  ?  ?  ?  ?  ?  ?  ?  ?  ?  ? ?  ?Cognition Arousal/Alertness: Awake/alert ?Behavior During Therapy: Restless, Impulsive ?Overall Cognitive Status: No family/caregiver present to determine baseline cognitive functioning ?Area of Impairment: Following commands, Safety/judgement, Attention, Problem solving ?  ?  ?  ?  ?  ?  ?  ?  ?  ?  Current Attention Level: Sustained ?  ?Following Commands: Follows one step commands with increased time, Follows one step commands inconsistently ?Safety/Judgement: Decreased awareness of deficits, Decreased awareness of safety ?  ?Problem Solving: Slow processing, Requires verbal cues, Requires tactile cues ?  ?  ?  ? ?  ?Exercises    ? ?  ?General Comments General comments (skin integrity, edema, etc.): on Venturimask 35% VSS ?  ?  ? ?Pertinent Vitals/Pain Pain Assessment ?Pain Assessment: Faces ?Faces Pain Scale: Hurts a little bit ?Pain Location: L wrist (for restraint removal) ?Pain Descriptors / Indicators: Grimacing ?Pain Intervention(s): Monitored during session, Repositioned  ? ? ?Home Living   ?  ?  ?  ?  ?  ?  ?  ?  ?  ?   ?  ?Prior Function    ?  ?  ?   ? ?PT Goals (current goals can now be found in the care plan section) Progress towards PT goals: Progressing toward goals ? ?  ?Frequency ? ? ? Min 4X/week ? ? ? ?  ?PT Plan Current plan remains appropriate  ? ? ?Co-evaluation   ?  ?  ?  ?  ? ?  ?AM-PAC PT "6 Clicks" Mobility   ?Outcome Measure ? Help needed turning from your back to your side while in a flat bed without using bedrails?: A Little ?Help needed moving from lying on your back to sitting on the side of a flat bed without using bedrails?: A Lot ?Help needed moving to and from a bed to a chair (including a wheelchair)?: A Lot ?Help needed standing up from a chair using your arms (e.g., wheelchair or bedside chair)?: A Lot ?Help needed to walk in hospital room?: Total ?Help needed climbing 3-5 steps with a railing? : Total ?6 Click Score: 11 ? ?  ?End of Session Equipment Utilized During Treatment: Gait belt;Oxygen ?Activity Tolerance: Patient tolerated treatment well ?Patient left: in chair;with call bell/phone within reach;with chair alarm set;with restraints reapplied ?Nurse Communication: Mobility status ?PT Visit Diagnosis: Other abnormalities of gait and mobility (R26.89);Other symptoms and signs involving the nervous system (R29.898);Hemiplegia and hemiparesis ?Hemiplegia - Right/Left: Right ?Hemiplegia - dominant/non-dominant: Dominant ?Hemiplegia - caused by: Cerebral infarction ?  ? ? ?Time: 8022-3361 ?PT Time Calculation (min) (ACUTE ONLY): 24 min ? ?Charges:  $Therapeutic Activity: 23-37 mins          ?           ? ?Magda Kiel, PT ?Acute Rehabilitation Services ?QAESL:[phone] ?Office:[phone] ?03/08/2022 ? ? ? ?Reginia Naas ?03/08/2022, 3:04 PM ? ?

## 2022-03-08 NOTE — Plan of Care (Signed)
?  Problem: Safety: ?Goal: Non-violent Restraint(s) ?Outcome: Progressing ?  ?Problem: Education: ?Goal: Knowledge of disease or condition will improve ?Outcome: Progressing ?  ?Problem: Health Behavior/Discharge Planning: ?Goal: Ability to manage health-related needs will improve ?Outcome: Progressing ?  ?Problem: Self-Care: ?Goal: Ability to participate in self-care as condition permits will improve ?Outcome: Progressing ?  ?Problem: Nutrition: ?Goal: Risk of aspiration will decrease ?Outcome: Progressing ?  ?

## 2022-03-08 NOTE — Consult Note (Signed)
WOC Nurse Consult Note: ?Patient receiving care in [facility] 4N18. Assisted with turning by primary RN, Estill Bamberg. ?Reason for Consult: right buttock wound ?Wound type: highly likely a bruise ?Pressure Injury POA: NA ?Measurement: na, area is C shaped ?Wound bed: tissue is slightly purple, intact, and blanches. No open wound present ?Drainage (amount, consistency, odor) none ?Periwound: intact ?Dressing procedure/placement/frequency: ?There is a sacral foam dressing in place and should be continued. ?Monitor the area(s) for worsening of condition such as: ?Signs/symptoms of infection,  ?Increase in size,  ?Development of or worsening of odor, ?Development of pain, or increased pain at the affected locations.  Notify the medical team if any of these develop. ? ?Thank you for the consult.  Discussed plan of care with the bedside nurse.  [place] nurse will not follow at this time.  Please re-consult the [place] team if needed. ? ?Val Riles, RN, MSN, CWOCN, CNS-BC, pager [phone]  ?

## 2022-03-08 NOTE — Progress Notes (Signed)
STROKE TEAM PROGRESS NOTE  ? ?INTERVAL HISTORY ?No family at the bedside. Pt essentially unchanged from neuro and respiratory standpoint. Still has respiratory distress, CXR showed worsening RLL airspace, concerning for continued aspiration. Per CCM Dr. Tacy Learn, pt may need trach given persistent lack of gag.  ? ? ?Vitals:  ? 03/08/22 0600 03/08/22 0700 03/08/22 0749 03/08/22 0800  ?BP: (!) 149/73 (!) 154/80    ?Pulse: 61 61    ?Resp: 20 20    ?Temp:    99.2 ?F (37.3 ?C)  ?TempSrc:    Axillary  ?SpO2: 93% 96% 96%   ?Weight:      ?Height:      ? ?CBC:  ?Recent Labs  ?Lab 03/02/22 ?1813 03/02/22 ?1822 03/03/22 ?0430 03/03/22 ?1152 03/07/22 ?9242 03/08/22 ?6834  ?WBC 9.0   < > 9.7   < > 9.4 7.3  ?NEUTROABS 7.0  --  6.9  --   --   --   ?HGB 17.3*   < > 15.2   < > 14.1 15.3  ?HCT 52.2*   < > 45.5   < > 43.1 45.6  ?MCV 92.7   < > 91.5   < > 92.9 89.9  ?PLT 195   < > 166   < > 138* 149*  ? < > = values in this interval not displayed.  ? ?Basic Metabolic Panel:  ?Recent Labs  ?Lab 03/04/22 ?1962 03/04/22 ?1857 03/05/22 ?2297 03/07/22 ?9892 03/08/22 ?1194  ?NA 140  --    < > 139 140  ?K 4.3  --    < > 4.1 3.2*  ?CL 102  --    < > 103 101  ?CO2 29  --    < > 28 30  ?GLUCOSE 110*  --    < > 126* 127*  ?BUN 8  --    < > 9 9  ?CREATININE 0.74  --    < > 0.66 0.68  ?CALCIUM 8.6*  --    < > 8.7* 8.6*  ?MG 2.6* 2.3  --   --   --   ?PHOS 4.3 3.9  --   --   --   ? < > = values in this interval not displayed.  ? ?Lipid Panel:  ?Recent Labs  ?Lab 03/03/22 ?0430  ?CHOL 115  ?TRIG 104  ?HDL 34*  ?CHOLHDL 3.4  ?VLDL 21  ?[place] 60  ? ?HgbA1c:  ?Recent Labs  ?Lab 03/03/22 ?0430  ?HGBA1C 5.4  ? ?Urine Drug Screen:  ?Recent Labs  ?Lab 03/03/22 ?1740  ?LABOPIA NONE DETECTED  ?COCAINSCRNUR NONE DETECTED  ?LABBENZ NONE DETECTED  ?AMPHETMU NONE DETECTED  ?THCU NONE DETECTED  ?LABBARB NONE DETECTED  ?  ?Alcohol Level  ?Recent Labs  ?Lab 03/02/22 ?1813  ?ETH <10  ? ? ?IMAGING past 24 hours ?DG CHEST PORT 1 VIEW ? ?Result Date: 03/08/2022 ?CLINICAL  DATA:  Seizures. EXAM: PORTABLE CHEST 1 VIEW COMPARISON:  03/06/2022 FINDINGS: Lordotic positioning noted. Heart size is within normal limits. Worsening airspace disease is seen in the right lower lung. Previously noted nodular opacities in the left suprahilar region and right midlung are again noted. IMPRESSION: Worsening airspace disease in right lower lung. Persistent nodular opacities in left suprahilar region and right midlung. Electronically Signed   By: Marlaine Hind M.D.   On: 03/08/2022 08:16   ? ?PHYSICAL EXAM ?General:  Drowsy, ill-appearing patient in no acute distress ?Respiratory:  Respirations even and unlabored on facemask ? ?Neurological: On facemask, slightly  open eyes on voice, but not quite follow commands, eyes left gaze preference, not tracking, more consistent blinking to visual threat on the left but not blinking on the right.  Left upper extremity against gravity no drift, right upper extremity mild withdraw to pain.  Bilateral lower extremity withdraw to pain more on the left than right. Sensation, coordination and gait not tested. ? ?ASSESSMENT/PLAN ?[name] is a 61 y.o. male with history of HTN and left MCA stroke in 8/22 presenting after having a seizure. He was taken to the ED and was then found to have right sided weakness, slurred speech and expressive aphasia.  He was found to have left ICA occlusion on CTA head.  He was then taken for IR thrombectomy.  Left ICA was revascularized, and TICI 2c flow was achieved in left MCA M1 segment.  He was extubated after the procedure but was reintubated 3/16 for inability to protect his airway.  He was extubated and reintubated on 3/18 and self-extubated in the morning of 3/19. ? 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 s/p IR with TICI2c likely secondary large vessel disease with left ICA acute occlusion on chronic stenosis ?CTA head & neck Occlusion of left ICA just distal to bifurcation, diminished perfusion in  left MCA territory, chronic occlusion of left vertebral artery, no hemodynamically significant stenosis in the neck ?CT perfusion 121 mL penumbra but no core ?Post IR CT no hemorrhage ?MRI  acute left MCA territory infarct with associated edema ?MRA patent intracranial ICAs, moderate to severe left paraclinoid ICA stenosis, chronically occluded left vertebral artery ?CT head 3/18 showed no frank hematoma or significant hemorrhage ?2D Echo EF 40 to 45% ?LDL 60 ?HgbA1c 5.4 ?VTE prophylaxis - lovenox ?aspirin 81 mg daily prior to admission, now on aspirin 300 PR given no po access ?Therapy recommendations: CIR ?Disposition:  pending ? ?Respiratory Failure ?Intubated by CCM for inability to protect airway ?Ventilator management per CCM ?Bronchoscopy performed 3/16 with BAL ?Extubated and reintubated 3/18 ?Self-extubated 3/19, able to protect airway at this time ?Persistent respiratory distress ?Persistent lack of gag with worsening CXR - RLL ?CCM on board, considering needs of trach ? ?Seizure ?Patient found to have a seizure prior to admission ?EEG left hemisphere cortical dysfunction, no seizure ?Keppra 500 mg BID ? ?History of stroke ?07/2021 admitted for right-sided weakness, slurred speech and aphasia.  CT head and neck showed left ICA clinoid segment severe stenosis.  Left VA occlusion.  MRI showed left MCA large infarct with petechial hemorrhage.  EF 40 to 45%.  LDL 120, A1c 5.5.  Patient discharged on aspirin 81 with residual right-sided weakness and aphasia. ? ?Left ICA stenosis/occlusion ?07/2021 CTA head and neck showed left ICA clinoid segment severe stenosis ?This admission CT head and neck showed left ICA occlusion ?Status post ICA siphon angioplasty with residual 40 to 50% stenosis. ?MRA patent intracranial ICAs, moderate to severe left paraclinoid ICA stenosis ?On aspirin 300 PR now given no p.o. access ? ?Hypertension ?Home meds:  metoprolol 12.5 mg BID ?Stable ?BP goal < 180/105 ?Long-term BP goal  normotensive ? ?Hyperlipidemia ?Home meds:  atorvastatin 80 mg daily, not sure if taking PTA ?LDL 60, goal < 70 ?Resume lipitor 40 once p.o. access ?Continue statin at discharge ? ?Dysphagia ?Self extubated ?Currently no p.o. access ?N.p.o. status, on IV fluid ?Likely need core track on Wednesday ?Speech on board ? ?Tobacco abuse ?Current smoker ?Smoking cessation counseling will be provided ? ?Other  Stroke Risk Factors ? ? ?Other Active Problems ?Leukocytosis WBC 11.1 -> 7.6->9.4->7.3 ?Agitation- precedex started 3/19 -> now off ?Epistaxis with NG placement -on left nare Rhino Rocket ?Thrombocytopenia - Plts - 195->166->143->138->149 ?Per pt cousin, patient had stroke with residual expressive aphasia and right upper extremity flaccid and right lower extremity minimal movement. ?Hypokalemia - potassium - 4.1->3.2 - supplemented - recheck in AM (creatinine Q Wed per pharmacy while on Lovenox) ? ? ?Hospital day # 6 ? ?This patient is critically ill due to left MCA infarct, hx of stroke with residue, respiratory failure, seizure and at significant risk of neurological worsening, death form recurrent stroke, hemorrhagic conversion, sepsis, pneumonia, status epilepticus. This patient's care requires constant monitoring of vital signs, hemodynamics, respiratory and cardiac monitoring, review of multiple databases, neurological assessment, discussion with family, other specialists and medical decision making of high complexity. I spent 35 minutes of neurocritical care time in the care of this patient. I discussed with Dr. Tacy Learn CCM ? ?Rosalin Hawking, MD PhD ?Stroke Neurology ?03/08/2022 ?3:05 PM ? ? ? ?To contact Stroke Continuity provider, please refer to [email or web address] ?After hours, contact General Neurology  ?

## 2022-03-08 NOTE — Progress Notes (Signed)
Pt w/ 502 on bladder scan. Elink informed d/t pt having 2-3 days of successful spontaneous urinating. ?

## 2022-03-09 ENCOUNTER — Inpatient Hospital Stay (HOSPITAL_COMMUNITY): Payer: Medicaid Other

## 2022-03-09 DIAGNOSIS — I63232 Cerebral infarction due to unspecified occlusion or stenosis of left carotid arteries: Secondary | ICD-10-CM | POA: Diagnosis not present

## 2022-03-09 DIAGNOSIS — I639 Cerebral infarction, unspecified: Secondary | ICD-10-CM | POA: Diagnosis not present

## 2022-03-09 DIAGNOSIS — I63512 Cerebral infarction due to unspecified occlusion or stenosis of left middle cerebral artery: Secondary | ICD-10-CM | POA: Diagnosis not present

## 2022-03-09 DIAGNOSIS — J9601 Acute respiratory failure with hypoxia: Secondary | ICD-10-CM | POA: Diagnosis not present

## 2022-03-09 DIAGNOSIS — I6522 Occlusion and stenosis of left carotid artery: Secondary | ICD-10-CM | POA: Diagnosis not present

## 2022-03-09 DIAGNOSIS — Z978 Presence of other specified devices: Secondary | ICD-10-CM | POA: Diagnosis not present

## 2022-03-09 DIAGNOSIS — R569 Unspecified convulsions: Secondary | ICD-10-CM | POA: Diagnosis not present

## 2022-03-09 LAB — GLUCOSE, CAPILLARY
Glucose-Capillary: 111 mg/dL — ABNORMAL HIGH (ref 70–99)
Glucose-Capillary: 101 mg/dL — ABNORMAL HIGH (ref 70–99)
Glucose-Capillary: 105 mg/dL — ABNORMAL HIGH (ref 70–99)
Glucose-Capillary: 107 mg/dL — ABNORMAL HIGH (ref 70–99)
Glucose-Capillary: 102 mg/dL — ABNORMAL HIGH (ref 70–99)
Glucose-Capillary: 100 mg/dL — ABNORMAL HIGH (ref 70–99)

## 2022-03-09 LAB — BASIC METABOLIC PANEL
Anion gap: 13 (ref 5–15)
BUN: 20 mg/dL (ref 8–23)
CO2: 27 mmol/L (ref 22–32)
Calcium: 8.4 mg/dL — ABNORMAL LOW (ref 8.9–10.3)
Chloride: 98 mmol/L (ref 98–111)
Creatinine, Ser: 0.84 mg/dL (ref 0.61–1.24)
GFR, Estimated: >60 mL/min (ref >60)
Glucose, Bld: 107 mg/dL — ABNORMAL HIGH (ref 70–99)
Potassium: 3 mmol/L — ABNORMAL LOW (ref 3.5–5.1)
Sodium: 138 mmol/L (ref 135–145)

## 2022-03-09 LAB — CBC
HCT: 43.2 % (ref 39.0–52.0)
Hemoglobin: 14.6 g/dL (ref 13.0–17.0)
MCH: 30.7 pg (ref 26.0–34.0)
MCHC: 33.8 g/dL (ref 30.0–36.0)
MCV: 90.8 fL (ref 80.0–100.0)
Platelets: 185 10*3/uL (ref 150–400)
RBC: 4.76 MIL/uL (ref 4.22–5.81)
RDW: 13 % (ref 11.5–15.5)
WBC: 9.3 10*3/uL (ref 4.0–10.5)
nRBC: 0 % (ref 0.0–0.2)

## 2022-03-09 LAB — TRIGLYCERIDES: Triglycerides: 106 mg/dL (ref <150)

## 2022-03-09 IMAGING — RF DG NASO G TUBE PLC W/FL W/RAD
1 series · 1 of 1 positions shown · non-contrast
Comparison: None.

CLINICAL DATA: Clinical service unable to place tube through the
nose or beyond.

EXAM:
NASO G TUBE PLACEMENT WITH FL AND WITH RAD
FLUOROSCOPY:
Fluoroscopy Time:  6 minutes
Radiation Exposure Index (if provided by the fluoroscopic device):
89.50 mGy
Number of Acquired Spot Images: 1

[Series 1: cp_standard · 0.18mm/px · 1 of 1 slices shown]
[im 1/1]
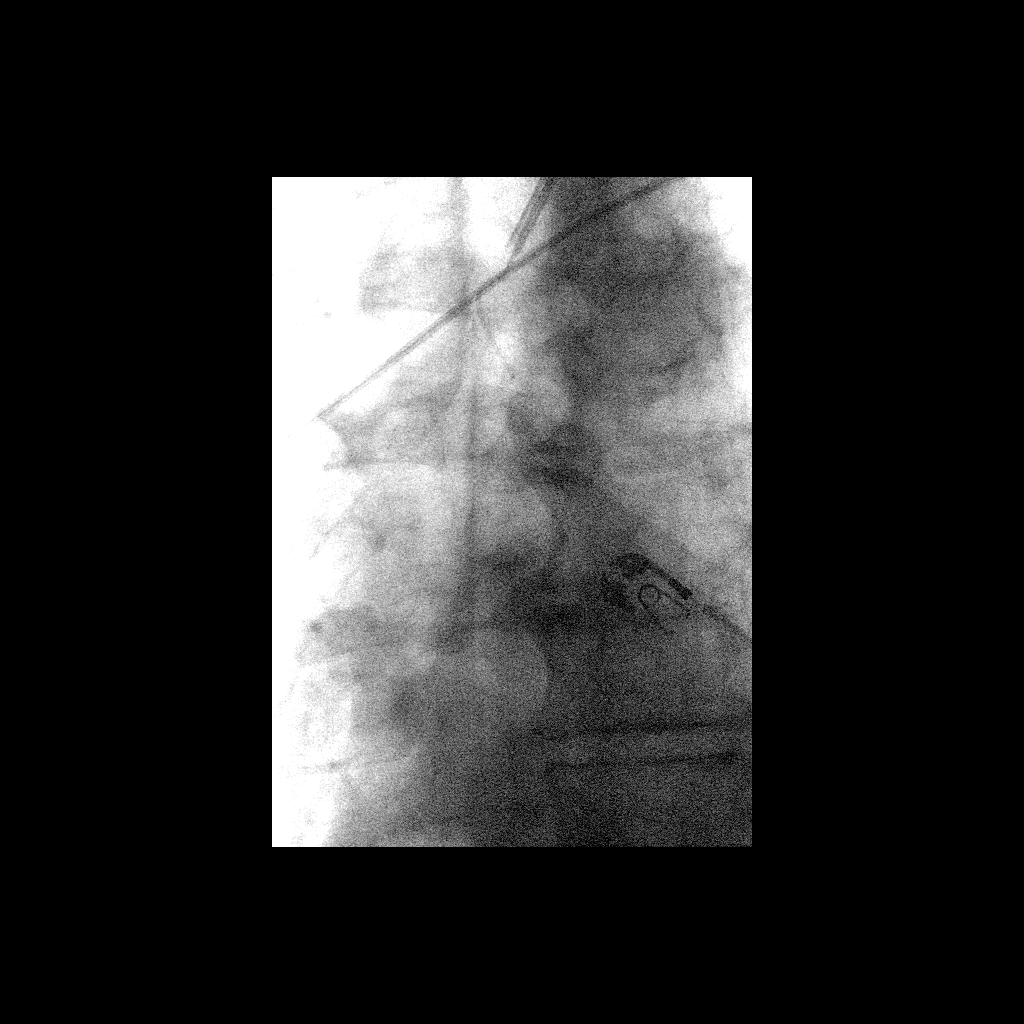

[1 of 1 positions shown; findings below may reference images not displayed]

FINDINGS: We were able to pass a tube and wire through the nasopharynx.
However, using multiple personnel with multiple attempts we were
unable to gain passage into the esophagus. Tube and wire repeatedly
entered the trachea despite all interventions. Therefore, the tube
placement was a failure and the tube and wire were removed.
IMPRESSION: We were unable to negotiate the tube into the esophagus.
Preferential entrance into the trachea could not be avoided.
Therefore, nothing was left in place.

## 2022-03-09 MED ORDER — NOREPINEPHRINE 4 MG/250ML-% IV SOLN
2.0000 ug/min | INTRAVENOUS | Status: DC
  Administered 2022-03-09: 4 ug/min via INTRAVENOUS

## 2022-03-09 MED ORDER — POTASSIUM CHLORIDE 10 MEQ/100ML IV SOLN
10.0000 meq | INTRAVENOUS | Status: AC
  Administered 2022-03-09 (×6): 10 meq via INTRAVENOUS
  Filled 2022-03-09 (×6): qty 100

## 2022-03-09 MED ORDER — ASPIRIN 300 MG RE SUPP: 300.0000 mg | Freq: Every day | RECTAL | Status: DC

## 2022-03-09 MED ORDER — BISACODYL 10 MG RE SUPP
10.0000 mg | Freq: Every day | RECTAL | Status: DC | PRN
  Administered 2022-03-09 – 2022-03-15 (×2): 10 mg via RECTAL
  Filled 2022-03-09: qty 1

## 2022-03-09 MED ORDER — ROCURONIUM BROMIDE 10 MG/ML (PF) SYRINGE
PREFILLED_SYRINGE | INTRAVENOUS | Status: AC
  Administered 2022-03-09: 100 mg
  Filled 2022-03-09: qty 10

## 2022-03-09 MED ORDER — ETOMIDATE 2 MG/ML IV SOLN
INTRAVENOUS | Status: AC
  Administered 2022-03-09: 20 mg
  Filled 2022-03-09: qty 10

## 2022-03-09 MED ORDER — ETOMIDATE 2 MG/ML IV SOLN: 20.0000 mg | Freq: Once | INTRAVENOUS | Status: AC

## 2022-03-09 MED ORDER — LIDOCAINE VISCOUS HCL 2 % MT SOLN
15.0000 mL | Freq: Once | OROMUCOSAL | Status: AC
  Administered 2022-03-09: 3 mL via OROMUCOSAL

## 2022-03-09 MED ORDER — ASPIRIN 300 MG RE SUPP
300.0000 mg | Freq: Every day | RECTAL | Status: DC
  Administered 2022-03-10: 300 mg via RECTAL
  Filled 2022-03-09 (×2): qty 1

## 2022-03-09 MED ORDER — SODIUM CHLORIDE 0.9 % IV SOLN
250.0000 mL | INTRAVENOUS | Status: DC
  Administered 2022-03-14: 250 mL via INTRAVENOUS

## 2022-03-09 MED ORDER — BETHANECHOL CHLORIDE 10 MG PO TABS: 10.0000 mg | ORAL_TABLET | Freq: Three times a day (TID) | ORAL | Status: DC

## 2022-03-09 MED ORDER — FUROSEMIDE 10 MG/ML IJ SOLN
40.0000 mg | Freq: Once | INTRAMUSCULAR | Status: AC
  Administered 2022-03-09: 40 mg via INTRAVENOUS
  Filled 2022-03-09: qty 4

## 2022-03-09 MED ORDER — ROCURONIUM BROMIDE 50 MG/5ML IV SOLN
100.0000 mg | Freq: Once | INTRAVENOUS | Status: AC
  Filled 2022-03-09: qty 10

## 2022-03-09 MED ORDER — ASPIRIN 325 MG PO TABS: 325.0000 mg | ORAL_TABLET | Freq: Every day | ORAL | Status: DC

## 2022-03-09 NOTE — Progress Notes (Signed)
Brief Nutrition Note ? ?Cortrak placement attempted. Unable to pass tube beyond 20cm without meeting resistance. Both nares attempted. Discussed with resident at bedside, requests that consult be placed for tube to be placed under fluoroscopy in Radiology. Radiology contacted about pt.  ? ?Ranell Patrick, RD, LDN ?Clinical Dietitian ?RD pager # available in [place]  ?After hours/weekend pager # available in [place] ?  ? ?  ?

## 2022-03-09 NOTE — Progress Notes (Signed)
Multiple unsuccessful attempts at NG insertion today. Trip to radiology for final attempt was unsuccessful. MD aware.  ? ?Normajean Baxter, RN ?

## 2022-03-09 NOTE — Procedures (Signed)
Bronchoscopy Procedure Note ? ?SHAVON ZENZ  ?[id number]  ?[birth year] ? ?Date:03/09/22  ?Time:11:31 AM  ? ?Provider Performing:Kynisha Memon  ? ?Procedure(s):  Flexible Bronchoscopy (80223) and Subsequent Therapeutic Aspiration of Tracheobronchial Tree (36122) ? ?Indication(s) ?Acute respiratory failure ? ?Consent ?Risks of the procedure as well as the alternatives and risks of each were explained to the patient and/or caregiver.  Consent for the procedure was obtained and is signed in the bedside chart ? ?Anesthesia ?Etomidate, Rocuronium ? ? ?Time Out ?Verified patient identification, verified procedure, site/side was marked, verified correct patient position, special equipment/implants available, medications/allergies/relevant history reviewed, required imaging and test results available. ? ? ?Sterile Technique ?Usual hand hygiene, masks, gowns, and gloves were used ? ? ?Procedure Description ?Bronchoscope advanced through tracheostomy tube and into airway.  Airways were examined down to subsegmental level with findings noted below.   ? ?Findings:  ?Trauma noted in right lung from prior bronch ?No significant mucus noted  ? ? ?Complications/Tolerance ?None; patient tolerated the procedure well. ?Chest X-ray is not needed post procedure. ? ? ?EBL ?Minimal ? ? ?Specimen(s) ?None ? ?

## 2022-03-09 NOTE — Progress Notes (Signed)
OT Cancellation Note ? ?Patient Details ?Name: Todd Estrada ?MRN: [id number] ?DOB: 05/21/1961 ? ? ?Cancelled Treatment:    Reason Eval/Treat Not Completed: Medical issues which prohibited therapy Pt reintubated/sedated with plans for trach today. Will assess another time.  ? ?Zenita Kister,HILLARY ?03/09/2022, 8:56 AM ?.hil ?

## 2022-03-09 NOTE — Progress Notes (Signed)
[facility] ADULT ICU REPLACEMENT PROTOCOL ? ? ?The patient does apply for the [facility] Adult ICU Electrolyte Replacment Protocol based on the criteria listed below:  ? ?1.Exclusion criteria: TCTS patients, ECMO patients, and Dialysis patients ?2. Is GFR >/= 30 ml/min? Yes.    ?Patient's GFR today is >60 ?3. Is SCr </= 2? Yes.   ?Patient's SCr is 0.84 mg/dL ?4. Did SCr increase >/= 0.5 in 24 hours? No. ?5.Pt's weight >40kg  Yes.   ?6. Abnormal electrolyte(s): K 3.0  ?7. Electrolytes replaced per protocol ?8.  Call MD STAT for K+ </= 2.5, Phos </= 1, or Mag </= 1 ?Physician:   ? ?[facility], Kathlene Yano A 03/09/2022 4:43 AM ? ?

## 2022-03-09 NOTE — Progress Notes (Addendum)
Pt w/ BPs (81/58 MAP 65, 73/56 MAP 63, 84/56 MAP 64). Pt's propofol titrated and eventually turned off. Oral/OG suctions producing bloody secretions totaling 350 cc. The on-call MD w/ Warren Lacy informed and updated. Per MD will conti to monitor pt's BPs and pt's sedation level before administering scheduled phenobarbitol or to see if the need for bolus. Also CBC lab ordered. Collaboration appreciated. ?

## 2022-03-09 NOTE — Plan of Care (Signed)
?  Problem: Safety: ?Goal: Non-violent Restraint(s) ?Outcome: Progressing ?  ?Problem: Education: ?Goal: Knowledge of disease or condition will improve ?Outcome: Progressing ?  ?Problem: Coping: ?Goal: Will verbalize positive feelings about self ?Outcome: Progressing ?  ?Problem: Health Behavior/Discharge Planning: ?Goal: Ability to manage health-related needs will improve ?Outcome: Progressing ?  ?Problem: Nutrition: ?Goal: Risk of aspiration will decrease ?Outcome: Progressing ?  ?Problem: Ischemic Stroke/TIA Tissue Perfusion: ?Goal: Complications of ischemic stroke/TIA will be minimized ?Outcome: Progressing ?  ?

## 2022-03-09 NOTE — Progress Notes (Signed)
? ?NAME:  Todd Estrada, MRN:  [id number], DOB:  1961-06-11, LOS: 7 ?ADMISSION DATE:  03/02/2022, CONSULTATION DATE:  3/16 ?REFERRING MD:  Erlinda Hong, CHIEF COMPLAINT:  Confusion, seizure  ? ?History of Present Illness:  ?61 y/o male with a history of Left MCA stroke in 3846 with a complicated post stroke course presented on 3/15 with a 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  Intubated on 3/16 for airway protection. ? ?Pertinent  Medical History  ?Hyeprtension ?Left MCA stroke ?Chronic combined CHF ? ?Significant Hospital Events: ?Including procedures, antibiotic start and stop dates in addition to other pertinent events   ?3/15 admitted for acute L MCA stroke due to L ICA occlusion with first time seizure s/p revascularization of L ICA occlusion ?3/16 Intubated for airway protection; started unasyn; echo LVEF 40-45%, RVSP normal ?3/17 MRI Brain showed acute infarct in left frontal, temporal and parietal lobes with findings worrisome for acute infarct, left vert occluded; CT head did not show hemorrhage; respiratory notes thick secretions,  ?3/18 following some commands on exam, extubated the re-intubated due to hypoxemia, inability to clear secretions ?3/19 self extubated overnight, remains on NRB. Rhino rocket placed for R nare epistaxis  ?3/21 required reintubation for inability to protect airway and aspiration  ? ?Interim History / Subjective:  ?Patient is on vent this morning. He is more awake and intermittently following commands. Remains on propofol ?Overnight, slightly hypotensive with SBP 70-80s, but maintained MAPs for which propofol was titrated down. Also noted to have urinary retention for which I/O'd.  ?  ?Objective   ?Blood pressure 95/68, pulse 73, temperature 98.7 ?F (37.1 ?C), temperature source Axillary, resp. rate 17, height 6' (1.829 m), weight 78.7 kg, SpO2 98 %. ?   ?Vent Mode: PRVC ?FiO2 (%):  [28 %-50 %] 50 % ?Set Rate:  [14 bmp-16 bmp] 16 bmp ?Vt Set:  [620 mL] 620  mL ?PEEP:  [5 cmH20] 5 cmH20 ?Plateau Pressure:  [13 cmH20-14 cmH20] 13 cmH20  ? ?Intake/Output Summary (Last 24 hours) at 03/09/2022 0742 ?Last data filed at 03/09/2022 0700 ?Gross per 24 hour  ?Intake 1688.78 ml  ?Output 3365 ml  ?Net -1676.22 ml  ? ?Filed Weights  ? 03/07/22 0500 03/08/22 0325 03/09/22 0600  ?Weight: 85.3 kg 83.7 kg 78.7 kg  ? ? ?Examination: ? General:  acute on chronically ill appearing and unkept elderly male, somnolent but arouses to physical stimuli ?HENT: NCAT bloody nares; rhinorocket in place R nare ?PULM: Diffuse rhonchi; no wheezing noted  ?CV: RRR, no mgr ?GI: BS+, soft, nontender ?MSK: normal bulk and tone ?Neuro: somnolent but arousable to physical stimuli ; PERRL; +gag reflex, intermittently following commands ? ?Resolved Hospital Problem list   ?Septic shock due to aspiration pneumonia> resolved ? ?Assessment & Plan:  ?Acute encephalopathy due to left MCA stroke from left ICA occlusion requiring thrombectomy and stent placement on 3/15 ?Seizure disorder ?Agitation delirium; possible ETOH withdrawal  ?- Continue Keppra ?- Continue phenobarbital x6days  ?- Continue to monitor neuro exam ?- Secondary stroke prevention per neurology/stroke service ?- BP goals and glucose goals per stroke service ?- Continue statin and aspirin; unable to take PO brilinta at this time  ?- PT/OT/SLP ?- Cautiously use propofol for sedation, monitor for bradycardia ? ?Acute hypoxemic respiratory failure due to aspiration pneumonia  ?Concern for emphysema/COPD  ?Aspiration risk ?Patient high aspiration risk with prior stroke with new left MCA stroke. Due to inability to clear airways and ongoing aspiration, patient intubated yesterday. Discussed  with family recommendation for trach - agreeable at this time.  ?- Bedside Percutaneous Trach today  ?- Continue Unasyn  ?- Continue guaifenesin and duonebs  ?- Oxygen supplement for goal SpO2 88-92% ?- Cortrak placement today ? ?Hypokalemia ?- Monitor and replete prn   ? ?Urinary retention ?Likely in setting of sedation use. Required I/O overnight for bladder scan >500 cc. Required I/O again this AM for urinary retension.  ?- Start Bethanecol  ?- If continuing to require, may need foley ? ?Combined systolic and diastolic heart failure, chronic ?Hypertension ?Continue to monitor hemodynamics ?Add hydralazine prn SBP > 160 ? ?Best Practice (right click and "Reselect all SmartList Selections" daily)  ? ?Diet/type: NPO ?DVT prophylaxis: LMWH ?GI prophylaxis: PPI ?Lines: N/A ?Foley:  N/A ?Code Status:  full code ?Last date of multidisciplinary goals of care discussion [per stroke service] ? ?Labs   ?CBC: ?Recent Labs  ?Lab 03/02/22 ?6389 03/02/22 ?1822 03/03/22 ?0430 03/03/22 ?1152 03/05/22 ?3734 03/06/22 ?2876 03/06/22 ?0630 03/07/22 ?8115 03/08/22 ?7262 03/08/22 ?1732 03/09/22 ?0301  ?WBC 9.0   < > 9.7   < > 7.6 7.8  --  9.4 7.3  --  9.3  ?NEUTROABS 7.0  --  6.9  --   --   --   --   --   --   --   --   ?HGB 17.3*   < > 15.2   < > 12.8* 12.9* 12.9* 14.1 15.3 16.0 14.6  ?HCT 52.2*   < > 45.5   < > 39.0 40.0 38.0* 43.1 45.6 47.0 43.2  ?MCV 92.7   < > 91.5   < > 93.5 95.9  --  92.9 89.9  --  90.8  ?PLT 195   < > 166   < > 129* 143*  --  138* 149*  --  185  ? < > = values in this interval not displayed.  ? ? ?Basic Metabolic Panel: ?Recent Labs  ?Lab 03/03/22 ?1410 03/03/22 ?1423 03/03/22 ?1638 03/04/22 ?0355 03/04/22 ?1857 03/05/22 ?0455 03/06/22 ?0409 03/06/22 ?0630 03/07/22 ?9741 03/08/22 ?6384 03/08/22 ?1732 03/09/22 ?0301  ?NA 140   < >  --  140  --  141 141 142 139 140 139 138  ?K 3.1*   < >  --  4.3  --  3.7 4.0 4.4 4.1 3.2* 3.3* 3.0*  ?CL 103  --   --  102  --  104 104  --  103 101  --  98  ?CO2 28  --   --  29  --  29 29  --  28 30  --  27  ?GLUCOSE 144*  --   --  110*  --  98 114*  --  126* 127*  --  107*  ?BUN 6*  --   --  8  --  14 13  --  9 9  --  20  ?CREATININE 0.78  --   --  0.74  --  0.76 0.78  --  0.66 0.68  --  0.84  ?CALCIUM 9.1  --   --  8.6*  --  8.5* 8.5*  --  8.7*  8.6*  --  8.4*  ?MG 1.4*  --  1.7 2.6* 2.3  --   --   --   --   --   --   --   ?PHOS 4.4  --  4.0 4.3 3.9  --   --   --   --   --   --   --   ? < > =  values in this interval not displayed.  ? ?GFR: ?Estimated Creatinine Clearance: 101.4 mL/min (by C-G formula based on SCr of 0.84 mg/dL). ?Recent Labs  ?Lab 03/03/22 ?1410 03/04/22 ?1007 03/06/22 ?1219 03/07/22 ?7588 03/08/22 ?3254 03/09/22 ?0301  ?WBC 8.1   < > 7.8 9.4 7.3 9.3  ?LATICACIDVEN 1.4  --   --   --   --   --   ? < > = values in this interval not displayed.  ? ? ?Liver Function Tests: ?Recent Labs  ?Lab 03/02/22 ?1813  ?AST 31  ?ALT 22  ?ALKPHOS 58  ?BILITOT 1.4*  ?PROT 6.3*  ?ALBUMIN 3.6  ? ?No results for input(s): LIPASE, AMYLASE in the last 168 hours. ?No results for input(s): AMMONIA in the last 168 hours. ? ?ABG ?   ?Component Value Date/Time  ? PHART 7.378 03/08/2022 1732  ? PCO2ART 55.9 (H) 03/08/2022 1732  ? PO2ART 98 03/08/2022 1732  ? HCO3 32.8 (H) 03/08/2022 1732  ? TCO2 34 (H) 03/08/2022 1732  ? O2SAT 97 03/08/2022 1732  ?  ? ?Coagulation Profile: ?No results for input(s): INR, PROTIME in the last 168 hours. ? ?Cardiac Enzymes: ?No results for input(s): CKTOTAL, CKMB, CKMBINDEX, TROPONINI in the last 168 hours. ? ?HbA1C: ?Hgb A1c MFr Bld  ?Date/Time Value Ref Range Status  ?03/03/2022 04:30 AM 5.4 4.8 - 5.6 % Final  ?  Comment:  ?  (NOTE) ?        Prediabetes: 5.7 - 6.4 ?        Diabetes: >6.4 ?        Glycemic control for adults with diabetes: <7.0 ?  ?08/19/2021 07:49 AM 5.5 4.8 - 5.6 % Final  ?  Comment:  ?  (NOTE) ?Pre diabetes:          5.7%-6.4% ? ?Diabetes:              >6.4% ? ?Glycemic control for   <7.0% ?adults with diabetes ?  ? ? ?CBG: ?Recent Labs  ?Lab 03/08/22 ?1124 03/08/22 ?1605 03/08/22 ?1928 03/08/22 ?2310 03/09/22 ?0335  ?GLUCAP 142* 112* 133* 115* 111*  ? ?Critical care time:  ?  ? ? ? ? ? ? ?

## 2022-03-09 NOTE — Procedures (Signed)
Diagnostic Bronchoscopy ? ?JAMESON TORMEY  ?[id number]  ?April 17, 1961 ? ?Date:03/09/22  ?Time:11:31 AM  ? ?Provider Performing:Kamryn Gauthier  ? ?Procedure: Diagnostic Bronchoscopy (14481) ? ?Indication(s) ?Assist with direct visualization of tracheostomy placement ? ?Consent ?Risks of the procedure as well as the alternatives and risks of each were explained to the patient and/or caregiver.  Consent for the procedure was obtained. ? ? ?Anesthesia ?See separate tracheostomy note ? ? ?Time Out ?Verified patient identification, verified procedure, site/side was marked, verified correct patient position, special equipment/implants available, medications/allergies/relevant history reviewed, required imaging and test results available. ? ? ?Sterile Technique ?Usual hand hygiene, masks, gowns, and gloves were used ? ? ?Procedure Description ?Bronchoscope advanced through endotracheal tube and into airway.  After suctioning out tracheal secretions, bronchoscope used to provide direct visualization of tracheostomy placement. ? ? ?Complications/Tolerance ?None; patient tolerated the procedure well. ? ? ?EBL ?None ? ?Specimen(s) ?None ? ?

## 2022-03-09 NOTE — Progress Notes (Signed)
PT Cancellation Note ? ?Patient Details ?Name: Todd Estrada ?MRN: [id number] ?DOB: 02/17/1961 ? ? ?Cancelled Treatment:    Reason Eval/Treat Not Completed: Medical issues which prohibited therapy; patient just s/p tracheostomy and sedated.  Will attempt again another day. ? ? ?Reginia Naas ?03/09/2022, 12:36 PM ?Magda Kiel, PT ?Acute Rehabilitation Services ?PPGFQ:[phone] ?Office:[phone] ?03/09/2022 ? ?

## 2022-03-09 NOTE — Plan of Care (Signed)
?  Problem: Safety: ?Goal: Non-violent Restraint(s) ?Outcome: Progressing ?  ?Problem: Education: ?Goal: Knowledge of disease or condition will improve ?Outcome: Progressing ?  ?Problem: Coping: ?Goal: Will verbalize positive feelings about self ?Outcome: Progressing ?  ?Problem: Health Behavior/Discharge Planning: ?Goal: Ability to manage health-related needs will improve ?Outcome: Progressing ?  ?Problem: Self-Care: ?Goal: Ability to participate in self-care as condition permits will improve ?Outcome: Progressing ?  ?Problem: Nutrition: ?Goal: Risk of aspiration will decrease ?Outcome: Progressing ?  ?Problem: Ischemic Stroke/TIA Tissue Perfusion: ?Goal: Complications of ischemic stroke/TIA will be minimized ?Outcome: Progressing ?  ?

## 2022-03-09 NOTE — Progress Notes (Signed)
Patient transported to IR and back without complications. RN at bedside. ?

## 2022-03-09 NOTE — Progress Notes (Signed)
STROKE TEAM PROGRESS NOTE  ? ?INTERVAL HISTORY ?No family at the bedside. Pt was re-intubated for respiratory distress.  CCM has discussed with patient cousin who is a Media planner and agree with tracheostomy.  Tracheostomy planned for today with Dr. Tacy Learn. ? ? ?Vitals:  ? 03/09/22 1400 03/09/22 1500 03/09/22 1555 03/09/22 1600  ?BP: 101/67 97/66  (!) 76/52  ?Pulse: 87 72  83  ?Resp: (!) 21 16    ?Temp:      ?TempSrc:      ?SpO2: 92% 97% 97% 98%  ?Weight:      ?Height:      ? ?CBC:  ?Recent Labs  ?Lab 03/02/22 ?1813 03/02/22 ?1822 03/03/22 ?0430 03/03/22 ?1152 03/08/22 ?8299 03/08/22 ?1732 03/09/22 ?0301  ?WBC 9.0   < > 9.7   < > 7.3  --  9.3  ?NEUTROABS 7.0  --  6.9  --   --   --   --   ?HGB 17.3*   < > 15.2   < > 15.3 16.0 14.6  ?HCT 52.2*   < > 45.5   < > 45.6 47.0 43.2  ?MCV 92.7   < > 91.5   < > 89.9  --  90.8  ?PLT 195   < > 166   < > 149*  --  185  ? < > = values in this interval not displayed.  ? ?Basic Metabolic Panel:  ?Recent Labs  ?Lab 03/04/22 ?3716 03/04/22 ?1857 03/05/22 ?0455 03/08/22 ?9678 03/08/22 ?1732 03/09/22 ?0301  ?NA 140  --    < > 140 139 138  ?K 4.3  --    < > 3.2* 3.3* 3.0*  ?CL 102  --    < > 101  --  98  ?CO2 29  --    < > 30  --  27  ?GLUCOSE 110*  --    < > 127*  --  107*  ?BUN 8  --    < > 9  --  20  ?CREATININE 0.74  --    < > 0.68  --  0.84  ?CALCIUM 8.6*  --    < > 8.6*  --  8.4*  ?MG 2.6* 2.3  --   --   --   --   ?PHOS 4.3 3.9  --   --   --   --   ? < > = values in this interval not displayed.  ? ?Lipid Panel:  ?Recent Labs  ?Lab 03/03/22 ?0430 03/09/22 ?0301  ?CHOL 115  --   ?TRIG 104 106  ?HDL 34*  --   ?CHOLHDL 3.4  --   ?VLDL 21  --   ?[place] 60  --   ? ?HgbA1c:  ?Recent Labs  ?Lab 03/03/22 ?0430  ?HGBA1C 5.4  ? ?Urine Drug Screen:  ?Recent Labs  ?Lab 03/03/22 ?9381  ?LABOPIA NONE DETECTED  ?COCAINSCRNUR NONE DETECTED  ?LABBENZ NONE DETECTED  ?AMPHETMU NONE DETECTED  ?THCU NONE DETECTED  ?LABBARB NONE DETECTED  ?  ?Alcohol Level  ?Recent Labs  ?Lab 03/02/22 ?1813  ?ETH <10   ? ? ?IMAGING past 24 hours ?DG Chest Port 1 View ? ?Result Date: 03/09/2022 ?CLINICAL DATA:  Placement of tracheostomy tube EXAM: PORTABLE CHEST 1 VIEW COMPARISON:  Previous studies including the examination of 03/08/2022 FINDINGS: There is interval placement of tracheostomy tube with its tip approximately 8.4 cm above the carina. Cardiac size is within normal limits. Nodular density in the left parahilar region has  not changed. There are linear densities in the right mid and right lower lung fields with no significant change. Right lateral CP angle is not included in the radiograph. There is no pneumothorax. IMPRESSION: Interval placement of tracheostomy tube. No significant interval changes are noted in the lung fields. Electronically Signed   By: Elmer Picker M.D.   On: 03/09/2022 14:27  ? ?DG Chest Port 1 View ? ?Result Date: 03/08/2022 ?CLINICAL DATA:  Endotracheal tube placement EXAM: PORTABLE CHEST 1 VIEW COMPARISON:  03/08/2022 FINDINGS: Endotracheal tube in good position mid trachea. COPD. Diffuse bilateral airspace disease with interval improvement. Small bilateral effusions unchanged. IMPRESSION: Endotracheal tube in good position Diffuse bilateral airspace disease with interval improvement. Electronically Signed   By: Franchot Gallo M.D.   On: 03/08/2022 16:56  ? ?DG Naso G Tube Plc W/Fl W/Rad ? ?Result Date: 03/09/2022 ?CLINICAL DATA:  Clinical service unable to place tube through the nose or beyond. EXAM: NASO G TUBE PLACEMENT WITH FL AND WITH RAD FLUOROSCOPY: Fluoroscopy Time:  6 minutes Radiation Exposure Index (if provided by the fluoroscopic device): 89.50 mGy Number of Acquired Spot Images: 1 COMPARISON:  None. FINDINGS: We were able to pass a tube and wire through the nasopharynx. However, using multiple personnel with multiple attempts we were unable to gain passage into the esophagus. Tube and wire repeatedly entered the trachea despite all interventions. Therefore, the tube placement was  a failure and the tube and wire were removed. IMPRESSION: We were unable to negotiate the tube into the esophagus. Preferential entrance into the trachea could not be avoided. Therefore, nothing was left in place. Electronically Signed   By: Nelson Chimes M.D.   On: 03/09/2022 16:34   ? ?PHYSICAL EXAM ? ?Temp:  [97.3 ?F (36.3 ?C)-98.7 ?F (37.1 ?C)] 98.3 ?F (36.8 ?C) (03/22 1200) ?Pulse Rate:  [60-137] 83 (03/22 1600) ?Resp:  [14-29] 16 (03/22 1500) ?BP: (73-218)/(52-154) 76/52 (03/22 1600) ?SpO2:  [92 %-100 %] 98 % (03/22 1600) ?FiO2 (%):  [40 %-50 %] 40 % (03/22 1555) ?Weight:  [78.7 kg] 78.7 kg (03/22 0600) ? ?General - Well nourished, well developed, intubated on sedation. ? ?Ophthalmologic - fundi not visualized due to noncooperation. ? ?Cardiovascular - Regular rate and rhythm. ? ?Neuro - intubated on sedation, slightly open eyes on voice, but not follow commands, eyes left gaze preference, not tracking, not blinking to visual threat bilaterally.  Facial symmetry not able to test due to ET tube.  Mild withdraw to pain on left but not on the right.  Sensation, coordination and gait not tested. ? ?ASSESSMENT/PLAN ?[name] is a 61 y.o. male with history of HTN and left MCA stroke in 8/22 presenting after having a seizure. He was taken to the ED and was then found to have right sided weakness, slurred speech and expressive aphasia.  He was found to have left ICA occlusion on CTA head.  He was then taken for IR thrombectomy.  Left ICA was revascularized, and TICI 2c flow was achieved in left MCA M1 segment.  He was extubated after the procedure but was reintubated 3/16 for inability to protect his airway.  He was extubated and reintubated on 3/18 and self-extubated in the morning of 3/19. ? 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 s/p IR with TICI2c likely secondary large vessel disease with left ICA acute occlusion on chronic stenosis ?CTA head & neck Occlusion of left ICA just distal  to bifurcation, diminished perfusion  in left MCA territory, chronic occlusion of left vertebral artery, no hemodynamically significant stenosis in the neck ?CT perfusion 121 mL penumbra but no core ?Post IR CT no hemorrhage ?MRI  acute left MCA territory infarct with associated edema ?MRA patent intracranial ICAs, moderate to severe left paraclinoid ICA stenosis, chronically occluded left vertebral artery ?CT head 3/18 showed no frank hematoma or significant hemorrhage ?2D Echo EF 40 to 45% ?LDL 60 ?HgbA1c 5.4 ?VTE prophylaxis - lovenox ?aspirin 81 mg daily prior to admission, now on aspirin 300 PR given no po access ?Therapy recommendations: CIR ?Disposition:  pending ? ?Respiratory Failure ?Intubated by CCM for inability to protect airway ?Ventilator management per CCM ?Bronchoscopy performed 3/16 with BAL ?Extubated and reintubated 3/18 ?Self-extubated 3/19, able to protect airway at this time ?Persistent respiratory distress ?Persistent lack of gag with worsening CXR - RLL ?Reintubated 3/21 ?Plan for trach today ? ?Seizure ?Patient found to have a seizure prior to admission ?EEG left hemisphere cortical dysfunction, no seizure ?Keppra 500 mg BID ? ?History of stroke ?07/2021 admitted for right-sided weakness, slurred speech and aphasia.  CT head and neck showed left ICA clinoid segment severe stenosis.  Left VA occlusion.  MRI showed left MCA large infarct with petechial hemorrhage.  EF 40 to 45%.  LDL 120, A1c 5.5.  Patient discharged on aspirin 81 with residual right-sided weakness and aphasia. ? ?Left ICA stenosis/occlusion ?07/2021 CTA head and neck showed left ICA clinoid segment severe stenosis ?This admission CT head and neck showed left ICA occlusion ?Status post ICA siphon angioplasty with residual 40 to 50% stenosis. ?MRA patent intracranial ICAs, moderate to severe left paraclinoid ICA stenosis ?On aspirin 300 PR now given no p.o. access ? ?Hypertension ?Home meds:  metoprolol 12.5 mg BID ?Stable ?BP  goal < 180/105 ?Long-term BP goal normotensive ? ?Hyperlipidemia ?Home meds:  atorvastatin 80 mg daily, not sure if taking PTA ?LDL 60, goal < 70 ?Resume lipitor 40 once p.o. access ?Continue statin at d

## 2022-03-09 NOTE — Procedures (Signed)
Percutaneous Tracheostomy Procedure Note ? ? ?SHALEV HELMINIAK  ?[id number]  ?1961-12-15 ? ?Date:03/09/22  ?Time:12:29 PM  ? ?Provider Performing:Ladon Vandenberghe ? ?Procedure: Percutaneous Tracheostomy with Bronchoscopic Guidance (72094) ? ?Indication(s) ?Acute respiratory failure ? ?Consent ?Risks of the procedure as well as the alternatives and risks of each were explained to the patient and/or caregiver.  Consent for the procedure was obtained. ? ?Anesthesia ?Etomidate, Versed, Fentanyl, Vecuronium ? ? ?Time Out ?Verified patient identification, verified procedure, site/side was marked, verified correct patient position, special equipment/implants available, medications/allergies/relevant history reviewed, required imaging and test results available. ? ? ?Sterile Technique ?Maximal sterile technique including sterile barrier drape, hand hygiene, sterile gown, sterile gloves, mask, hair covering. ? ? ? ?Procedure Description ?Appropriate anatomy identified by palpation.  Patient's neck prepped and draped in sterile fashion.  1% lidocaine with epinephrine was used to anesthetize skin overlying neck.  1.5cm incision made and blunt dissection performed until tracheal rings could be easily palpated.   Then a size 8 Shiley tracheostomy was placed under bronchoscopic visualization using usual Seldinger technique and serial dilation.   Bronchoscope confirmed placement above the carina.  Tracheostomy was sutured in place with adhesive pad to protect skin under pressure.    Patient connected to ventilator. ? ? ?Complications/Tolerance ?None; patient tolerated the procedure well. ?Chest X-ray is ordered to confirm no post-procedural complication. ? ? ?EBL ?Minimal ? ? ?Specimen(s) ?None  ? ?

## 2022-03-09 NOTE — Progress Notes (Signed)
eLink Physician-Brief Progress Note ?Patient Name: Todd Estrada ?DOB: August 28, 1961 ?MRN: [id number] ? ? ?Date of Service ? 03/09/2022  ?HPI/Events of Note ? Urinary retention - Bladder scan with > 500 mL residual.   ?eICU Interventions ? Plan: ?I/O Cath PRN.   ? ? ? ?Intervention Category ?Major Interventions: Other: ? ?Lesley Galentine Cornelia Copa ?03/09/2022, 12:01 AM ?

## 2022-03-09 NOTE — Progress Notes (Signed)
SLP Cancellation Note ? ?Patient Details ?Name: JAMELL LAYMON ?MRN: [id number] ?DOB: Nov 19, 1961 ? ? ?Cancelled treatment:       Reason Eval/Treat Not Completed: Patient not medically ready. Patient with new tracheostomy. Orders for SLP eval and treat for PMSV and swallowing received. Will follow pt closely for readiness for SLP interventions as appropriate.  ? ? ? ?Azizah Lisle, Katherene Ponto ?03/09/2022, 12:38 PM ?

## 2022-03-09 NOTE — Progress Notes (Signed)
This nurse secure chatted with [place]ery [place]. Consult was received to place a USGPIV. However, the plan at this time is to stop the vasopressor soon. Instructed Elizabeth RN to re-consult IV team if vasopressor is needing to be continued and a USGPIV is needed. VU. Fran Lowes, RN VAST ?

## 2022-03-09 NOTE — Progress Notes (Signed)
SLP Cancellation Note ? ?Patient Details ?Name: Todd Estrada ?MRN: [id number] ?DOB: [birth year] ? ? ?Cancelled treatment:       Reason Eval/Treat Not Completed: Medical issues which prohibited therapy (Pt has been re-intubated since evaluation and RN indicated that the plan is for trach placement. SLP will follow up on subsequent date.) ? ?Chae Shuster I. Hardin Negus, [place], CCC-SLP ?Acute Rehabilitation Services ?Office number [phone] ?Pager [phone] ? ?Todd Estrada ?03/09/2022, 8:43 AM ?

## 2022-03-10 ENCOUNTER — Encounter (HOSPITAL_COMMUNITY): Payer: Self-pay | Admitting: Neurology

## 2022-03-10 DIAGNOSIS — I63512 Cerebral infarction due to unspecified occlusion or stenosis of left middle cerebral artery: Secondary | ICD-10-CM | POA: Diagnosis not present

## 2022-03-10 DIAGNOSIS — R1312 Dysphagia, oropharyngeal phase: Secondary | ICD-10-CM

## 2022-03-10 DIAGNOSIS — E440 Moderate protein-calorie malnutrition: Secondary | ICD-10-CM | POA: Diagnosis not present

## 2022-03-10 DIAGNOSIS — I6522 Occlusion and stenosis of left carotid artery: Secondary | ICD-10-CM | POA: Diagnosis not present

## 2022-03-10 DIAGNOSIS — I639 Cerebral infarction, unspecified: Secondary | ICD-10-CM | POA: Diagnosis not present

## 2022-03-10 DIAGNOSIS — R569 Unspecified convulsions: Secondary | ICD-10-CM | POA: Diagnosis not present

## 2022-03-10 LAB — CBC
HCT: 42.9 % (ref 39.0–52.0)
Hemoglobin: 14.1 g/dL (ref 13.0–17.0)
MCH: 30.4 pg (ref 26.0–34.0)
MCHC: 32.9 g/dL (ref 30.0–36.0)
MCV: 92.5 fL (ref 80.0–100.0)
Platelets: 191 10*3/uL (ref 150–400)
RBC: 4.64 MIL/uL (ref 4.22–5.81)
RDW: 13.1 % (ref 11.5–15.5)
WBC: 8.8 10*3/uL (ref 4.0–10.5)
nRBC: 0 % (ref 0.0–0.2)

## 2022-03-10 LAB — GLUCOSE, CAPILLARY
Glucose-Capillary: 104 mg/dL — ABNORMAL HIGH (ref 70–99)
Glucose-Capillary: 103 mg/dL — ABNORMAL HIGH (ref 70–99)
Glucose-Capillary: 91 mg/dL (ref 70–99)
Glucose-Capillary: 96 mg/dL (ref 70–99)
Glucose-Capillary: 86 mg/dL (ref 70–99)
Glucose-Capillary: 91 mg/dL (ref 70–99)

## 2022-03-10 LAB — COMPREHENSIVE METABOLIC PANEL
ALT: 14 U/L (ref 0–44)
AST: 16 U/L (ref 15–41)
Albumin: 2.5 g/dL — ABNORMAL LOW (ref 3.5–5.0)
Alkaline Phosphatase: 47 U/L (ref 38–126)
Anion gap: 8 (ref 5–15)
BUN: 16 mg/dL (ref 8–23)
CO2: 29 mmol/L (ref 22–32)
Calcium: 8.3 mg/dL — ABNORMAL LOW (ref 8.9–10.3)
Chloride: 106 mmol/L (ref 98–111)
Creatinine, Ser: 0.78 mg/dL (ref 0.61–1.24)
GFR, Estimated: >60 mL/min (ref >60)
Glucose, Bld: 102 mg/dL — ABNORMAL HIGH (ref 70–99)
Potassium: 3.2 mmol/L — ABNORMAL LOW (ref 3.5–5.1)
Sodium: 143 mmol/L (ref 135–145)
Total Bilirubin: 1.2 mg/dL (ref 0.3–1.2)
Total Protein: 6 g/dL — ABNORMAL LOW (ref 6.5–8.1)

## 2022-03-10 MED ORDER — POTASSIUM CHLORIDE 10 MEQ/100ML IV SOLN
10.0000 meq | INTRAVENOUS | Status: AC
  Administered 2022-03-10 (×4): 10 meq via INTRAVENOUS
  Filled 2022-03-10 (×2): qty 100

## 2022-03-10 MED ORDER — POTASSIUM CHLORIDE 10 MEQ/100ML IV SOLN
10.0000 meq | INTRAVENOUS | Status: DC
  Administered 2022-03-10 (×4): 10 meq via INTRAVENOUS
  Filled 2022-03-10 (×5): qty 100

## 2022-03-10 MED ORDER — FOLIC ACID 5 MG/ML IJ SOLN
1.0000 mg | Freq: Every day | INTRAMUSCULAR | Status: DC
  Administered 2022-03-10 – 2022-03-13 (×4): 1 mg via INTRAVENOUS
  Filled 2022-03-10 (×4): qty 0.2

## 2022-03-10 MED ORDER — THIAMINE HCL 100 MG/ML IJ SOLN
100.0000 mg | Freq: Every day | INTRAMUSCULAR | Status: DC
  Administered 2022-03-10 – 2022-03-13 (×4): 100 mg via INTRAVENOUS
  Filled 2022-03-10 (×4): qty 2

## 2022-03-10 NOTE — Evaluation (Signed)
Occupational Therapy Evaluation ?Patient Details ?Name: Todd Estrada ?MRN: [id number] ?DOB: [birth year] ?Today's Date: 04/09/22 ? ? ?History of Present Illness 61 y/o male with a history of Left MCA stroke in 7824 with a complicated post stroke course presented on 3/15 with a 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  He was extubated following procedure, but reintubated due to respiratory failure.  Self extubated overnight 3/19, now on venturi mask.  Re-intubated on 3/21 and bronchoscopy and tracheostomy performed on 3/22.  ? ?Clinical Impression ?  ?Spoke with cousin [name] over the phone. At baseline, Garnett lives with his cousin and his uncle, who passed away this am April 10, 2023, and was R hemiparetic however was able to ambulate and complete his self care at a modified independent level..  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 Pt seen at bed level today due to lethargy from sedation and increased bloody secretions. Intermittently following commands and overall total A with self-care and mobility @ bed level. Pt may be a good candidate for AIR, however will most likely need 24/7 assistance after DC. If this level of physical assistance is not available, pt will need SNF. Acute OT to follow.  ?Pt suctioned during session. ?   ? ?Recommendations for follow up therapy are one component of a multi-disciplinary discharge planning process, led by the attending physician.  Recommendations may be updated based on patient status, additional functional criteria and insurance authorization.  ? ?Follow Up Recommendations ? Acute inpatient rehab (3hours/day) (pending progress)  ?  ?Assistance Recommended at Discharge Frequent or constant Supervision/Assistance  ?Patient can return home with the following   ? ?  ?Functional Status Assessment ? Patient has had a recent decline in their functional status and demonstrates the ability to make significant improvements in function in a reasonable and predictable amount of  time.  ?Equipment Recommendations ? Hospital bed;Tub/shower bench  ?  ?Recommendations for Other Services Rehab consult ? ? ?  ?Precautions / Restrictions Precautions ?Precautions: Fall ?Precaution Comments: mitts, posey, wrist restraint on L, R inattention ?Restrictions ?Weight Bearing Restrictions: No  ? ?  ? ?Mobility Bed Mobility ?  ?  ?  ?  ?  ?  ?  ?General bed mobility comments: bed level only ?  ? ?Transfers ?  ?  ?  ?  ?  ?  ?  ?  ?  ?  ?  ? ?  ?Balance Overall balance assessment: Needs assistance ?Sitting-balance support: Feet supported ?Sitting balance-Leahy Scale: Poor ?Sitting balance - Comments: leaning forward and to R ?  ?  ?  ?  ?  ?  ?  ?  ?  ?  ?  ?  ?  ?  ?  ?   ? ?ADL either performed or assessed with clinical judgement  ? ?ADL Overall ADL's : Needs assistance/impaired ?  ?  ?  ?  ?  ?  ?  ?  ?  ?  ?  ?  ?  ?  ?  ?  ?  ?  ?  ?General ADL Comments: total A at this time  ? ? ? ?Vision Baseline Vision/History: 2 Legally blind ?   ?   ?Perception   ?  ?Praxis Praxis ?Praxis-Other Comments: ? motor planning deficits ?  ? ?Pertinent Vitals/Pain Pain Assessment ?Pain Assessment: Faces ?Faces Pain Scale: Hurts a little bit ?Pain Location: generalized discomfort ?Pain Descriptors / Indicators: Grimacing (coughing) ?Pain Intervention(s): Limited activity within patient's tolerance  ? ? ? ?  Hand Dominance Right (L dominant since CVA in 2022`) ?  ?Extremity/Trunk Assessment Upper Extremity Assessment ?Upper Extremity Assessment: RUE deficits/detail ?RUE Deficits / Details: flaccid; IP flexion contracture R hand; limited ROM into flexion of digits; at baseline able to lift and lower shoulder however non-functional hand; moving RUE at times spontaneously into adduction/synergy pattern; not moving to command ?RUE: Subluxation noted ?  ?Lower Extremity Assessment ?Lower Extremity Assessment: Difficult to assess due to impaired cognition ?RLE Deficits / Details: active movement noted, not following commands for  strength testing ?RLE Coordination: decreased gross motor ?  ?  ?  ?Communication Communication ?Communication: Tracheostomy ?  ?Cognition Arousal/Alertness: Lethargic, Suspect due to medications ?Behavior During Therapy: Flat affect ?Overall Cognitive Status: Difficult to assess ?Area of Impairment: Following commands, Safety/judgement, Attention, Problem solving ?  ?  ?  ?  ?  ?  ?  ?  ?  ?Current Attention Level: Focused ?  ?Following Commands: Follows one step commands inconsistently ?Safety/Judgement: Decreased awareness of deficits, Decreased awareness of safety ?  ?Problem Solving: Slow processing, Requires verbal cues, Requires tactile cues, Decreased initiation ?General Comments: inconsistently following commands ?  ?  ?General Comments  PSVC; 40%; peep5 ? ?  ?Exercises Exercises: General Upper Extremity ?General Exercises - Upper Extremity ?Shoulder Flexion: Left, 10 reps, AAROM, AROM ?Elbow Flexion: AROM, AAROM, Left, 10 reps ?Elbow Extension: AROM, AAROM, Left ?Digit Composite Flexion: Left ?Composite Extension: AROM, AAROM, Left ?Other Exercises ?Other Exercises: R UE PROM ?  ?Shoulder Instructions    ? ? ?Home Living Family/patient expects to be discharged to:: Private residence ?Living Arrangements: Other relatives (cousin and uncle) ?Available Help at Discharge: Family ?Type of Home: House ?Home Access: Stairs to enter ?Entrance Stairs-Number of Steps: 2 ?  ?Home Layout: One level ?  ?  ?Bathroom Shower/Tub: Tub/shower unit ?  ?Bathroom Toilet: Standard ?Bathroom Accessibility: No ?  ?Home Equipment: Conservation officer, nature (2 wheels);Cane - single point;BSC/3in1;Wheelchair - manual ?  ?Additional Comments: home information from prior admission, pt unable to state ?  ? ?  ?Prior Functioning/Environment Prior Level of Function : Independent/Modified Independent ?  ?  ?  ?  ?  ?  ?  ?  ?  ? ?  ?  ?OT Problem List: Decreased strength;Decreased range of motion;Decreased activity tolerance;Impaired balance  (sitting and/or standing);Impaired vision/perception;Decreased coordination;Decreased cognition;Decreased safety awareness;Decreased knowledge of use of DME or AE;Decreased knowledge of precautions;Cardiopulmonary status limiting activity;Impaired sensation;Impaired tone;Impaired UE functional use;Pain;Increased edema ?  ?   ?OT Treatment/Interventions: Therapeutic exercise;Neuromuscular education;DME and/or AE instruction;Therapeutic activities;Cognitive remediation/compensation;Visual/perceptual remediation/compensation;Patient/family education;Balance training  ?  ?OT Goals(Current goals can be found in the care plan section) Acute Rehab OT Goals ?Patient Stated Goal: per [name] for [place] to get better ?OT Goal Formulation: With family ?Time For Goal Achievement: 03/24/22 ?Potential to Achieve Goals: Good  ?OT Frequency: Min 2X/week ?  ? ?Co-evaluation   ?  ?  ?  ?  ? ?  ?AM-PAC OT "6 Clicks" Daily Activity     ?Outcome Measure Help from another person eating meals?: Total ?Help from another person taking care of personal grooming?: Total ?Help from another person toileting, which includes using toliet, bedpan, or urinal?: Total ?Help from another person bathing (including washing, rinsing, drying)?: Total ?Help from another person to put on and taking off regular upper body clothing?: Total ?Help from another person to put on and taking off regular lower body clothing?: Total ?6 Click Score: 6 ?  ?End of Session Nurse Communication: Mobility status;Other (  comment) ? ?Activity Tolerance: Patient limited by lethargy;Patient limited by fatigue;Other (comment) (increased bloody secretions) ?Patient left: in bed;with call bell/phone within reach;with bed alarm set ? ?OT Visit Diagnosis: Unsteadiness on feet (R26.81);Other abnormalities of gait and mobility (R26.89);Muscle weakness (generalized) (M62.81);Low vision, both eyes (H54.2);Other symptoms and signs involving the nervous system (R29.898);Other symptoms and  signs involving cognitive function;Cognitive communication deficit (R41.841);Hemiplegia and hemiparesis ?Symptoms and signs involving cognitive functions: Cerebral infarction ?Hemiplegia - Right/Left: Right ?He

## 2022-03-10 NOTE — Progress Notes (Addendum)
SLP Cancellation Note ? ?Patient Details ?Name: Todd Estrada ?MRN: [id number] ?DOB: 1961-09-27 ? ? ?Cancelled treatment:       Reason Eval/Treat Not Completed: Patient not medically ready.  ? ?Maribelle Hopple, Katherene Ponto ?03/10/2022, 8:13 AM ?

## 2022-03-10 NOTE — Evaluation (Addendum)
Physical Therapy Re-Evaluation ?Patient Details ?Name: Todd Estrada ?MRN: [id number] ?DOB: 11/02/1961 ?Today's Date: 03/10/2022 ? ?History of Present Illness ? 61 y/o male with a history of Left MCA stroke in 4193 with a complicated post stroke course presented on 3/15 with a 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  He was extubated following procedure, but reintubated due to respiratory failure.  Self extubated overnight 3/19, now on venturi mask.  Re-intubated on 3/21 and bronchoscopy and tracheostomy performed on 3/22.  ?Clinical Impression ? Patient appropriate for in bed session per RN due to bloody secretions at trach and some intermittent agitation now weaning on vent and off sedation.  Patient not following commands today for in bed exercises needing assistance to perform.  He still moves the L side independently, but R LE seems possibly weaker, but difficult to assess due to not following commands today.  Patient remains appropriate for skilled PT in the acute setting and for follow up acute inpatient rehab prior to d/c home with family support.  ?   ? ?Recommendations for follow up therapy are one component of a multi-disciplinary discharge planning process, led by the attending physician.  Recommendations may be updated based on patient status, additional functional criteria and insurance authorization. ? ?Follow Up Recommendations Acute inpatient rehab (3hours/day) ? ?  ?Assistance Recommended at Discharge Frequent or constant Supervision/Assistance  ?Patient can return home with the following ? A lot of help with bathing/dressing/bathroom;Direct supervision/assist for medications management;Assist for transportation;Help with stairs or ramp for entrance;Two people to help with walking and/or transfers ? ?  ?Equipment Recommendations Other (comment) (TBA)  ?Recommendations for Other Services ? Rehab consult  ?  ?Functional Status Assessment Patient has had a recent decline in  their functional status and demonstrates the ability to make significant improvements in function in a reasonable and predictable amount of time.  ? ?  ?Precautions / Restrictions Precautions ?Precautions: Fall ?Precaution Comments: mitts, posey, wrist restraint on L, R inattention ?Restrictions ?Weight Bearing Restrictions: No  ? ?  ? ?Mobility ? Bed Mobility ?  ?  ?  ?  ?  ?  ?  ?General bed mobility comments: scooting up in bed with +2 total A.  Patient moving L leg off bed on his own, RN requested bed level only due to bloody secretions in/around trach ?  ? ?Transfers ?  ?  ?  ?  ?  ?  ?  ?  ?  ?  ?  ? ?Ambulation/Gait ?  ?  ?  ?  ?  ?  ?  ?  ? ?Stairs ?  ?  ?  ?  ?  ? ?Wheelchair Mobility ?  ? ?Modified Rankin (Stroke Patients Only) ?Modified Rankin (Stroke Patients Only) ?Pre-Morbid Rankin Score: Slight disability ?Modified Rankin: Severe disability ? ?  ? ?Balance   ?  ?  ?  ?  ?  ?  ?  ?  ?  ?  ?  ?  ?  ?  ?  ?  ?  ?  ?   ? ? ? ?Pertinent Vitals/Pain Pain Assessment ?Facial Expression: Relaxed, neutral ?Body Movements: Absence of movements ?Muscle Tension: Relaxed ?Compliance with ventilator (intubated pts.): Tolerating ventilator or movement ?Vocalization (extubated pts.): N/A ?CPOT Total: 0  ? ? ?Home Living   ?Living Arrangements: Other relatives (cousin and uncle) ?Available Help at Discharge: Family ?Type of Home: House ?Home Access: Level entry ?  ?  ?  ?Home Layout:  One level ?Home Equipment: None ?Additional Comments: home information from prior admission, pt unable to state  ?  ?Prior Function   ?  ?  ?  ?  ?  ?  ?  ?  ?  ? ? ?Hand Dominance  ? Dominant Hand: Right ? ?  ?Extremity/Trunk Assessment  ? Upper Extremity Assessment ?Upper Extremity Assessment: RUE deficits/detail ?RUE Deficits / Details: flaccid ?RUE: Subluxation noted ?  ? ?Lower Extremity Assessment ?Lower Extremity Assessment: Difficult to assess due to impaired cognition ?RLE Deficits / Details: active movement noted, not following  commands for strength testing ?  ? ?   ?Communication  ? Communication: Tracheostomy  ?Cognition Arousal/Alertness: Awake/alert ?Behavior During Therapy: Restless ?Overall Cognitive Status: Difficult to assess ?  ?  ?  ?  ?  ?  ?  ?  ?  ?  ?Current Attention Level: Focused ?  ?Following Commands: Follows one step commands inconsistently ?  ?  ?  ?General Comments: not following commands today ?  ?  ? ?  ?General Comments General comments (skin integrity, edema, etc.): on PS/CPAP on trach via vent 40% FiO2, PEEP 5 ? ?  ?Exercises Other Exercises ?Other Exercises: in bed therex performed attempting to have pt pushing into resistance for leg extension, but not following commands, though pusing intermittently ?Other Exercises: R UE PROM  ? ?Assessment/Plan  ?  ?PT Assessment Patient needs continued PT services  ?PT Problem List Decreased strength;Decreased mobility;Decreased safety awareness;Decreased balance;Decreased knowledge of use of DME;Decreased coordination;Decreased cognition;Decreased activity tolerance ? ?   ?  ?PT Treatment Interventions DME instruction;Therapeutic activities;Cognitive remediation;Patient/family education;Therapeutic exercise;Balance training;Functional mobility training;Wheelchair mobility training   ? ?PT Goals (Current goals can be found in the Care Plan section)  ?Acute Rehab PT Goals ?Patient Stated Goal: unable to state ?PT Goal Formulation: Patient unable to participate in goal setting ?Time For Goal Achievement: 03/24/22 ?Potential to Achieve Goals: Fair ? ?  ?Frequency Min 4X/week ?  ? ? ?Co-evaluation   ?  ?  ?  ?  ? ? ?  ?AM-PAC PT "6 Clicks" Mobility  ?Outcome Measure Help needed turning from your back to your side while in a flat bed without using bedrails?: Total ?Help needed moving from lying on your back to sitting on the side of a flat bed without using bedrails?: Total ?Help needed moving to and from a bed to a chair (including a wheelchair)?: Total ?Help needed standing up  from a chair using your arms (e.g., wheelchair or bedside chair)?: Total ?Help needed to walk in hospital room?: Total ?Help needed climbing 3-5 steps with a railing? : Total ?6 Click Score: 6 ? ?  ?End of Session   ?Activity Tolerance: Patient limited by lethargy ?Patient left: in bed;with restraints reapplied ?  ?PT Visit Diagnosis: Other abnormalities of gait and mobility (R26.89);Other symptoms and signs involving the nervous system (R29.898);Hemiplegia and hemiparesis ?Hemiplegia - Right/Left: Right ?Hemiplegia - dominant/non-dominant: Dominant ?Hemiplegia - caused by: Cerebral infarction ?  ? ?Time: 9924-2683 ?PT Time Calculation (min) (ACUTE ONLY): 13 min ? ? ?Charges:   PT Evaluation ?$PT Re-evaluation: 1 Re-eval ?  ?  ?   ? ? ?Magda Kiel, PT ?Acute Rehabilitation Services ?MHDQQ:[phone] ?Office:[phone] ?03/10/2022 ? ? ?Reginia Naas ?03/10/2022, 11:54 AM ? ?

## 2022-03-10 NOTE — Progress Notes (Addendum)
? ?NAME:  Todd Estrada, MRN:  [id number], DOB:  [birth year], LOS: 8 ?ADMISSION DATE:  03/02/2022, CONSULTATION DATE:  3/16 ?REFERRING MD:  Erlinda Hong, CHIEF COMPLAINT:  Confusion, seizure  ? ?History of Present Illness:  ?61 y/o male with a history of Left MCA stroke in 9211 with a complicated post stroke course presented on 3/15 with a 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  Intubated on 3/16 for airway protection. ? ?Pertinent  Medical History  ?Hyeprtension ?Left MCA stroke ?Chronic combined CHF ? ?Significant Hospital Events: ?Including procedures, antibiotic start and stop dates in addition to other pertinent events   ?3/15 admitted for acute L MCA stroke due to L ICA occlusion with first time seizure s/p revascularization of L ICA occlusion ?3/16 Intubated for airway protection; started unasyn; echo LVEF 40-45%, RVSP normal ?3/17 MRI Brain showed acute infarct in left frontal, temporal and parietal lobes with findings worrisome for acute infarct, left vert occluded; CT head did not show hemorrhage; respiratory notes thick secretions,  ?3/18 following some commands on exam, extubated the re-intubated due to hypoxemia, inability to clear secretions ?3/19 self extubated overnight, remains on NRB. Rhino rocket placed for R nare epistaxis  ?3/21 required reintubation for inability to protect airway and aspiration  ?3/22 bedside perc tracheostomy with bronchoscopy  ? ?Interim History / Subjective:  ?Patient is on pressure support this morning. Remains propofol gtt for sedation ?  ?Objective   ?Blood pressure 110/83, pulse 75, temperature 99.5 ?F (37.5 ?C), temperature source Axillary, resp. rate 18, height 6' (1.829 m), weight 77.9 kg, SpO2 97 %. ?   ?Vent Mode: PRVC ?FiO2 (%):  [40 %] 40 % ?Set Rate:  [16 bmp] 16 bmp ?Vt Set:  [620 mL] 620 mL ?PEEP:  [5 cmH20] 5 cmH20 ?Plateau Pressure:  [11 cmH20-17 cmH20] 17 cmH20  ? ?Intake/Output Summary (Last 24 hours) at 03/10/2022 0657 ?Last data filed at  03/10/2022 0600 ?Gross per 24 hour  ?Intake 1666.25 ml  ?Output 1960 ml  ?Net -293.75 ml  ? ?Filed Weights  ? 03/08/22 0325 03/09/22 0600 03/10/22 0300  ?Weight: 83.7 kg 78.7 kg 77.9 kg  ? ? ?Examination: ? General:  acute on chronically ill appearing and unkept elderly male, somnolent but arouses to physical stimuli ?HENT: NCAT dried blood in bilat nares, dry MM  ?PULM: Diffuse rhonchi; no wheezing noted, tolerating pressure support on trach  ?CV: RRR, no mgr ?GI: BS+, soft, nontender ?MSK: normal bulk and tone ?Neuro: somnolent but arousable to physical stimuli ; PERRL  ? ?Resolved Hospital Problem list   ?Septic shock due to aspiration pneumonia> resolved ?Urinary retention ?Assessment & Plan:  ?Acute encephalopathy due to left MCA stroke from left ICA occlusion requiring thrombectomy and stent placement on 3/15 ?Seizure disorder ?Agitation delirium; possible ETOH withdrawal  ?- Continue Keppra ?- Continue phenobarbital x6days  ?- Continue to monitor neuro exam ?- Secondary stroke prevention per neurology/stroke service ?- BP goals and glucose goals per stroke service ?- Continue statin and aspirin; unable to take PO brilinta at this time  ?- PT/OT/SLP ?- Cautiously use propofol for sedation, monitor for bradycardia ? ?Acute hypoxemic respiratory failure due to aspiration pneumonia s/p trach ?Concern for emphysema/COPD  ?Aspiration risk ?Patient high aspiration risk with prior stroke with new left MCA stroke underwent bedside percutaneous trach on 3/23. Has completed 7d course of Unasyn. Tolerating pressure support trach collar  ?-Trach collar as tolerated  ?- Continue guaifenesin and duonebs  ?- Oxygen supplement for goal  SpO2 88-92% ?- PEG tube placement tomm.  ? ?Hypokalemia ?- Monitor and replete prn  ? ?RUE thrombophlebitis ?Warm compresses prn ? ?Combined systolic and diastolic heart failure, chronic ?Hypertension ?Continue to monitor hemodynamics ?hydralazine prn SBP > 160 ? ?Best Practice (right click and  "Reselect all SmartList Selections" daily)  ? ?Diet/type: NPO ?DVT prophylaxis: LMWH ?GI prophylaxis: PPI ?Lines: N/A ?Foley:  N/A ?Code Status:  full code ?Last date of multidisciplinary goals of care discussion [per stroke service] ? ?Labs   ?CBC: ?Recent Labs  ?Lab 03/06/22 ?0409 03/06/22 ?0630 03/07/22 ?9528 03/08/22 ?4132 03/08/22 ?1732 03/09/22 ?0301 03/10/22 ?0459  ?WBC 7.8  --  9.4 7.3  --  9.3 8.8  ?HGB 12.9*   < > 14.1 15.3 16.0 14.6 14.1  ?HCT 40.0   < > 43.1 45.6 47.0 43.2 42.9  ?MCV 95.9  --  92.9 89.9  --  90.8 92.5  ?PLT 143*  --  138* 149*  --  185 191  ? < > = values in this interval not displayed.  ? ? ?Basic Metabolic Panel: ?Recent Labs  ?Lab 03/03/22 ?1410 03/03/22 ?1423 03/03/22 ?1638 03/04/22 ?4401 03/04/22 ?1857 03/05/22 ?0272 03/06/22 ?5366 03/06/22 ?0630 03/07/22 ?4403 03/08/22 ?4742 03/08/22 ?1732 03/09/22 ?0301 03/10/22 ?0459  ?NA 140   < >  --  140  --    < > 141   < > 139 140 139 138 143  ?K 3.1*   < >  --  4.3  --    < > 4.0   < > 4.1 3.2* 3.3* 3.0* 3.2*  ?CL 103  --   --  102  --    < > 104  --  103 101  --  98 106  ?CO2 28  --   --  29  --    < > 29  --  28 30  --  27 29  ?GLUCOSE 144*  --   --  110*  --    < > 114*  --  126* 127*  --  107* 102*  ?BUN 6*  --   --  8  --    < > 13  --  9 9  --  20 16  ?CREATININE 0.78  --   --  0.74  --    < > 0.78  --  0.66 0.68  --  0.84 0.78  ?CALCIUM 9.1  --   --  8.6*  --    < > 8.5*  --  8.7* 8.6*  --  8.4* 8.3*  ?MG 1.4*  --  1.7 2.6* 2.3  --   --   --   --   --   --   --   --   ?PHOS 4.4  --  4.0 4.3 3.9  --   --   --   --   --   --   --   --   ? < > = values in this interval not displayed.  ? ?GFR: ?Estimated Creatinine Clearance: 106.4 mL/min (by C-G formula based on SCr of 0.78 mg/dL). ?Recent Labs  ?Lab 03/03/22 ?1410 03/04/22 ?5956 03/07/22 ?3875 03/08/22 ?6433 03/09/22 ?0301 03/10/22 ?0459  ?WBC 8.1   < > 9.4 7.3 9.3 8.8  ?LATICACIDVEN 1.4  --   --   --   --   --   ? < > = values in this interval not displayed.  ? ? ?Liver Function  Tests: ?Recent Labs  ?  Lab 03/10/22 ?1540  ?AST 16  ?ALT 14  ?ALKPHOS 47  ?BILITOT 1.2  ?PROT 6.0*  ?ALBUMIN 2.5*  ? ?No results for input(s): LIPASE, AMYLASE in the last 168 hours. ?No results for input(s): AMMONIA in the last 168 hours. ? ?ABG ?   ?Component Value Date/Time  ? PHART 7.378 03/08/2022 1732  ? PCO2ART 55.9 (H) 03/08/2022 1732  ? PO2ART 98 03/08/2022 1732  ? HCO3 32.8 (H) 03/08/2022 1732  ? TCO2 34 (H) 03/08/2022 1732  ? O2SAT 97 03/08/2022 1732  ?  ? ?Coagulation Profile: ?No results for input(s): INR, PROTIME in the last 168 hours. ? ?Cardiac Enzymes: ?No results for input(s): CKTOTAL, CKMB, CKMBINDEX, TROPONINI in the last 168 hours. ? ?HbA1C: ?Hgb A1c MFr Bld  ?Date/Time Value Ref Range Status  ?03/03/2022 04:30 AM 5.4 4.8 - 5.6 % Final  ?  Comment:  ?  (NOTE) ?        Prediabetes: 5.7 - 6.4 ?        Diabetes: >6.4 ?        Glycemic control for adults with diabetes: <7.0 ?  ?08/19/2021 07:49 AM 5.5 4.8 - 5.6 % Final  ?  Comment:  ?  (NOTE) ?Pre diabetes:          5.7%-6.4% ? ?Diabetes:              >6.4% ? ?Glycemic control for   <7.0% ?adults with diabetes ?  ? ? ?CBG: ?Recent Labs  ?Lab 03/09/22 ?1134 03/09/22 ?1623 03/09/22 ?1921 03/09/22 ?2316 03/10/22 ?0324  ?GLUCAP 105* 107* 102* 100* 104*  ? ?Critical care time:  ?  ? ? ? ? ? ? ?

## 2022-03-10 NOTE — Progress Notes (Signed)
STROKE TEAM PROGRESS NOTE  ? ?INTERVAL HISTORY ?No family at the bedside. Pt had tracheostomy with Dr. Tacy Learn yesterday.  Patient today lying in bed, seems more awake alert, eyes open to voice, calm without agitation, however not follow commands, right hemiplegia and right hemianopia.  Not able to have core track at bedside or with IR.  Plan for PEG tube tomorrow with trauma surgery. ? ? ?Vitals:  ? 03/10/22 1500 03/10/22 1600 03/10/22 1700 03/10/22 1800  ?BP: 129/70 125/67 130/69 120/65  ?Pulse: 72 67 67 70  ?Resp: (!) 22 19 19  (!) 22  ?Temp:  98.6 ?F (37 ?C)    ?TempSrc:      ?SpO2: 100% 97% 98% 95%  ?Weight:      ?Height:      ? ?CBC:  ?Recent Labs  ?Lab 03/09/22 ?0301 03/10/22 ?0258  ?WBC 9.3 8.8  ?HGB 14.6 14.1  ?HCT 43.2 42.9  ?MCV 90.8 92.5  ?PLT 185 191  ? ?Basic Metabolic Panel:  ?Recent Labs  ?Lab 03/04/22 ?5277 03/04/22 ?1857 03/05/22 ?8242 03/09/22 ?0301 03/10/22 ?0459  ?NA 140  --    < > 138 143  ?K 4.3  --    < > 3.0* 3.2*  ?CL 102  --    < > 98 106  ?CO2 29  --    < > 27 29  ?GLUCOSE 110*  --    < > 107* 102*  ?BUN 8  --    < > 20 16  ?CREATININE 0.74  --    < > 0.84 0.78  ?CALCIUM 8.6*  --    < > 8.4* 8.3*  ?MG 2.6* 2.3  --   --   --   ?PHOS 4.3 3.9  --   --   --   ? < > = values in this interval not displayed.  ? ?Lipid Panel:  ?Recent Labs  ?Lab 03/09/22 ?0301  ?TRIG 106  ? ? ?IMAGING past 24 hours ?DG Abd 1 View ? ?Result Date: 03/04/2022 ?CLINICAL DATA:  Stroke Pre MRI screening EXAM: ABDOMEN - 1 VIEW COMPARISON:  None. FINDINGS: No dilated loops of bowel to indicate ileus or obstruction. Extensive atherosclerotic arterial calcifications are noted. Mild degenerative changes present lower lumbar spine. Enteric tube extensive left upper quadrant in the expected site of the stomach. No abnormal metallic densities are identified, however evaluation is limited due to multiple overlying external artifacts. IMPRESSION: No metallic density foreign body identified within the visualized abdomen.  Electronically Signed   By: Miachel Roux M.D.   On: 03/04/2022 13:32  ? ?CT HEAD WO CONTRAST (5MM) ? ?Result Date: 03/05/2022 ?CLINICAL DATA:  Stroke follow-up EXAM: CT HEAD WITHOUT CONTRAST TECHNIQUE: Contiguous axial images were obtained from the base of the skull through the vertex without intravenous contrast. RADIATION DOSE REDUCTION: This exam was performed according to the departmental dose-optimization program which includes automated exposure control, adjustment of the mA and/or kV according to patient size and/or use of iterative reconstruction technique. COMPARISON:  MRI brain 03/04/2022 FINDINGS: Brain: Subacute left MCA territory infarct with unchanged areas of cortical hypoattenuation. Encephalomalacia in the left PCA territory is unchanged. No acute hemorrhage or mass effect. Vascular: No abnormal hyperdensity of the major intracranial arteries or dural venous sinuses. No intracranial atherosclerosis. Skull: The visualized skull base, calvarium and extracranial soft tissues are normal. Sinuses/Orbits: Opacification of the maxillary and sphenoid sinuses. The orbits are normal. IMPRESSION: Subacute left MCA territory infarct without acute hemorrhage or mass effect. Electronically Signed  By: Ulyses Jarred M.D.   On: 03/05/2022 01:03  ? ?CT HEAD WO CONTRAST ? ?Result Date: 03/02/2022 ?CLINICAL DATA:  Seizure like activity, postictal, history of alcohol abuse EXAM: CT HEAD WITHOUT CONTRAST TECHNIQUE: Contiguous axial images were obtained from the base of the skull through the vertex without intravenous contrast. RADIATION DOSE REDUCTION: This exam was performed according to the departmental dose-optimization program which includes automated exposure control, adjustment of the mA and/or kV according to patient size and/or use of iterative reconstruction technique. COMPARISON:  08/20/2021 FINDINGS: Brain: Chronic left MCA territory infarct again identified, with interval dystrophic calcification in the  infarct bed since prior study. Chronic ischemic changes are again identified throughout the periventricular and subcortical white matter, left basal ganglia, and left cerebellar hemisphere. No evidence of acute infarct or hemorrhage. Lateral ventricles and remaining midline structures are unremarkable. No acute extra-axial fluid collections. No mass effect. Vascular: No hyperdense vessel or unexpected calcification. Skull: Normal. Negative for fracture or focal lesion. Sinuses/Orbits: Chronic opacification right maxillary sinus. Stable mucosal thickening left maxillary sinus. Other: None. IMPRESSION: 1. Chronic ischemic changes as above. No acute intracranial process. Electronically Signed   By: Randa Ngo M.D.   On: 03/02/2022 19:57  ? ?CT CERVICAL SPINE WO CONTRAST ? ?Result Date: 03/02/2022 ?CLINICAL DATA:  Seizure like activity, history of alcohol abuse, neck trauma EXAM: CT CERVICAL SPINE WITHOUT CONTRAST TECHNIQUE: Multidetector CT imaging of the cervical spine was performed without intravenous contrast. Multiplanar CT image reconstructions were also generated. RADIATION DOSE REDUCTION: This exam was performed according to the departmental dose-optimization program which includes automated exposure control, adjustment of the mA and/or kV according to patient size and/or use of iterative reconstruction technique. COMPARISON:  None. FINDINGS: Alignment: Alignment is anatomic. Skull base and vertebrae: No acute fracture. No primary bone lesion or focal pathologic process. Soft tissues and spinal canal: No prevertebral fluid or swelling. No visible canal hematoma. Disc levels: Multilevel cervical spondylosis, with prominent anterior osteophyte formation from C4-5 through C6-7. Circumferential disc osteophyte complex at C3-4 and C6-7 results in right greater than left neural foraminal narrowing. Multilevel facet hypertrophy greatest from C4 through C6. Upper chest: Central airways patent.  Severe upper lobe  emphysema. Other: Reconstructed images demonstrate no additional findings. IMPRESSION: 1. No acute cervical spine fracture. 2. Multilevel cervical spondylosis and facet hypertrophy as above. 3. Emphysema. Electronically Signed   By: Randa Ngo M.D.   On: 03/02/2022 20:00  ? ?MR ANGIO HEAD WO CONTRAST ? ?Addendum Date: 03/04/2022   ?ADDENDUM REPORT: 03/04/2022 17:39 ADDENDUM: Findings discussed with Dr. Leonel Ramsay via telephone at 5:35 p.m. Electronically Signed   By: Margaretha Sheffield M.D.   On: 03/04/2022 17:39  ? ?Result Date: 03/04/2022 ?CLINICAL DATA:  Neuro deficit, acute, stroke suspected; Stroke, follow up EXAM: MRI HEAD WITHOUT CONTRAST MRA HEAD WITHOUT CONTRAST TECHNIQUE: Multiplanar, multi-echo pulse sequences of the brain and surrounding structures were acquired without intravenous contrast. Angiographic images of the Circle of Willis were acquired using MRA technique without intravenous contrast. COMPARISON:  No pertinent prior exam. FINDINGS: MRI HEAD FINDINGS Brain: Acute left MCA territory infarcts predominantly involving the left frontal, parietal and temporal lobes. Infarcts involve both cortex and white matter. Small acute infarct involving the left insula. Associated edema and sulcal effacement without midline shift. Areas of susceptibility artifact and T2 hypointensity. Areas of T2 hypointensity, T1 hyperintensity and susceptibility artifact is concerning for acute hemorrhage although an element prior hemorrhage/cortical laminar necrosis is possible posteriorly. No hydrocephalus. Vascular: Major arterial flow  voids are maintained at the skull base. Skull and upper cervical spine: Normal marrow signal. Sinuses/Orbits: Paranasal sinus mucosal thickening. No acute orbital findings. Other: Small left greater than right mastoid effusions. MRA HEAD FINDINGS Anterior circulation: The intracranial ICAs are now patent. Left ICAs attenuated, likely due to pre-existing atherosclerosis with probably  moderate to severe paraclinoid ICA stenosis. Bilateral MCAs and ACAs are patent without proximal hemodynamically significant stenosis. Posterior circulation: Chronically occluded left intradural vertebral artery. Right intradu

## 2022-03-10 NOTE — Progress Notes (Signed)
[facility] ADULT ICU REPLACEMENT PROTOCOL ? ? ?The patient does apply for the [facility] Adult ICU Electrolyte Replacment Protocol based on the criteria listed below:  ? ?1.Exclusion criteria: TCTS patients, ECMO patients, and Dialysis patients ?2. Is GFR >/= 30 ml/min? Yes.    ?Patient's GFR today is >60 ?3. Is SCr </= 2? Yes.   ?Patient's SCr is 0.78 mg/dL ?4. Did SCr increase >/= 0.5 in 24 hours? No. ?5.Pt's weight >40kg  Yes.   ?6. Abnormal electrolyte(s): k 3.2  ?7. Electrolytes replaced per protocol ?8.  Call MD STAT for K+ </= 2.5, Phos </= 1, or Mag </= 1 ?Physician:   ? ?[facility], Naliya Gish A 03/10/2022 6:39 AM ? ?

## 2022-03-10 NOTE — Consult Note (Signed)
? ? ? ?[name] ?1961/11/26  ?[id number].   ? ?Requesting MD: Dr. Rosalin Hawking ?Chief Complaint/Reason for Consult: PEG consult s/p CVA ? ?HPI:  ?This is a 61 yo white male who has a history of HTN who presented with a CVA and underwent IR intervention for a L MCA infarct with L ICA and MCA occlusions.  He required tracheostomy yesterday due to failed ability to maintain his airway after multiple extubation attempts.  He is arousable, but does not follow commands.  He does have some purposeful movement as he tried to pull at his trach with his left hand.  He has been evaluated by SLP, but unable to assess for swallow, minimal gag on assessment, and unable to get a Cortrak in for tube feedings.  We have been asked to see for PEG tube placement. ? ?ROS: ?ROS: Unable, on vent ? ?History reviewed. No pertinent family history. ? ?Past Medical History:  ?Diagnosis Date  ? Hypertension   ? ? ?Past Surgical History:  ?Procedure Laterality Date  ? IR ANGIO INTRA EXTRACRAN SEL COM CAROTID INNOMINATE UNI R MOD SED  03/02/2022  ? IR CT HEAD LTD  03/02/2022  ? IR PERCUTANEOUS ART THROMBECTOMY/INFUSION INTRACRANIAL INC DIAG ANGIO  03/02/2022  ? RADIOLOGY WITH ANESTHESIA N/A 03/02/2022  ? Procedure: IR WITH ANESTHESIA;  Surgeon: Luanne Bras, MD;  Location: [facility];  Service: Radiology;  Laterality: N/A;  ? ? ?Social History:  reports that he has been smoking. He has never used smokeless tobacco. He reports current alcohol use. He reports that he does not use drugs. ? ?Allergies: No Known Allergies ? ?Medications Prior to Admission  ?Medication Sig Dispense Refill  ? aspirin EC 81 MG EC tablet Take 1 tablet (81 mg total) by mouth daily. Swallow whole. (Patient not taking: Reported on 03/07/2022) 30 tablet 11  ? atorvastatin (LIPITOR) 80 MG tablet Take 1 tablet (80 mg total) by mouth daily. (Patient not taking: Reported on 03/07/2022) 30 tablet 1  ? metoprolol tartrate (LOPRESSOR) 25 MG tablet Take 0.5 tablets (12.5 mg total) by  mouth 2 (two) times daily. (Patient not taking: Reported on 03/07/2022) 30 tablet 1  ? ? ? ?Physical Exam: ?Blood pressure 114/73, pulse 69, temperature 98.1 ?F (36.7 ?C), resp. rate 20, height 6' (1.829 m), weight 77.9 kg, SpO2 98 %. ?General: somewhat disheveled appearing white male who is laying in bed in NAD ?HEENT: head is normocephalic, atraumatic.  Sclera are noninjected.  PERRL.  Ears and nose without any masses or lesions.  Mouth is pink and moist with no teeth ?Heart: regular, rate, and rhythm.  Normal s1,s2. No obvious murmurs, gallops, or rubs noted.  Palpable radial and pedal pulses bilaterally ?Lungs: diffuse coarse BS noted.  Trach in place with some blood-tinged sputum in tubing.  Site is c/d/i ?Abd: soft, NT, ND, +BS, no masses, hernias, or organomegaly.  No scars noted from previous surgery ?MS: all 4 extremities are symmetrical with no cyanosis, clubbing, or edema.   ?Skin: warm and dry with no masses.  RUE with thrombophlebitis at the [facility] from IV.  Second left toe is erythematous with 4 small chronic appearing wounds.  Several other scabs and chronic small wounds noted on legs and feet ?Neuro: doesn't follow commands.  Does raise his left hand to trach ?Psych: unable ? ? ?Results for orders placed or performed during the hospital encounter of 03/02/22 (from the past 48 hour(s))  ?Glucose, capillary     Status: Abnormal  ? Collection  Time: 03/08/22 11:24 AM  ?Result Value Ref Range  ? Glucose-Capillary 142 (H) 70 - 99 mg/dL  ?  Comment: Glucose reference range applies only to samples taken after fasting for at least 8 hours.  ?Glucose, capillary     Status: Abnormal  ? Collection Time: 03/08/22  4:05 PM  ?Result Value Ref Range  ? Glucose-Capillary 112 (H) 70 - 99 mg/dL  ?  Comment: Glucose reference range applies only to samples taken after fasting for at least 8 hours.  ?I-STAT 7, (LYTES, BLD GAS, ICA, H+H)     Status: Abnormal  ? Collection Time: 03/08/22  5:32 PM  ?Result Value Ref Range  ? pH,  Arterial 7.378 7.35 - 7.45  ? pCO2 arterial 55.9 (H) 32 - 48 mmHg  ? pO2, Arterial 98 83 - 108 mmHg  ? Bicarbonate 32.8 (H) 20.0 - 28.0 mmol/L  ? TCO2 34 (H) 22 - 32 mmol/L  ? O2 Saturation 97 %  ? Acid-Base Excess 6.0 (H) 0.0 - 2.0 mmol/L  ? Sodium 139 135 - 145 mmol/L  ? Potassium 3.3 (L) 3.5 - 5.1 mmol/L  ? Calcium, Ion 1.16 1.15 - 1.40 mmol/L  ? HCT 47.0 39.0 - 52.0 %  ? Hemoglobin 16.0 13.0 - 17.0 g/dL  ? Patient temperature 99.3 F   ? Collection site RADIAL, ALLEN'S TEST ACCEPTABLE   ? Drawn by RT   ? Sample type ARTERIAL   ?Glucose, capillary     Status: Abnormal  ? Collection Time: 03/08/22  7:28 PM  ?Result Value Ref Range  ? Glucose-Capillary 133 (H) 70 - 99 mg/dL  ?  Comment: Glucose reference range applies only to samples taken after fasting for at least 8 hours.  ?Glucose, capillary     Status: Abnormal  ? Collection Time: 03/08/22 11:10 PM  ?Result Value Ref Range  ? Glucose-Capillary 115 (H) 70 - 99 mg/dL  ?  Comment: Glucose reference range applies only to samples taken after fasting for at least 8 hours.  ?Basic metabolic panel     Status: Abnormal  ? Collection Time: 03/09/22  3:01 AM  ?Result Value Ref Range  ? Sodium 138 135 - 145 mmol/L  ? Potassium 3.0 (L) 3.5 - 5.1 mmol/L  ? Chloride 98 98 - 111 mmol/L  ? CO2 27 22 - 32 mmol/L  ? Glucose, Bld 107 (H) 70 - 99 mg/dL  ?  Comment: Glucose reference range applies only to samples taken after fasting for at least 8 hours.  ? BUN 20 8 - 23 mg/dL  ? Creatinine, Ser 0.84 0.61 - 1.24 mg/dL  ? Calcium 8.4 (L) 8.9 - 10.3 mg/dL  ? GFR, Estimated >60 >60 mL/min  ?  Comment: (NOTE) ?Calculated using the CKD-EPI Creatinine Equation (2021) ?  ? Anion gap 13 5 - 15  ?  Comment: Performed at [facility], [place] [address] [place], [place] [address]  ?CBC     Status: None  ? Collection Time: 03/09/22  3:01 AM  ?Result Value Ref Range  ? WBC 9.3 4.0 - 10.5 K/uL  ? RBC 4.76 4.22 - 5.81 MIL/uL  ? Hemoglobin 14.6 13.0 - 17.0 g/dL  ? HCT 43.2 39.0 - 52.0 %  ? MCV  90.8 80.0 - 100.0 fL  ? MCH 30.7 26.0 - 34.0 pg  ? MCHC 33.8 30.0 - 36.0 g/dL  ? RDW 13.0 11.5 - 15.5 %  ? Platelets 185 150 - 400 K/uL  ? nRBC 0.0 0.0 -  0.2 %  ?  Comment: Performed at [facility], [place] [address] [place], [place] [address]  ?Triglycerides     Status: None  ? Collection Time: 03/09/22  3:01 AM  ?Result Value Ref Range  ? Triglycerides 106 <150 mg/dL  ?  Comment: Performed at [facility], [place] [address], [place], [place] [address]  ?Glucose, capillary     Status: Abnormal  ? Collection Time: 03/09/22  3:35 AM  ?Result Value Ref Range  ? Glucose-Capillary 111 (H) 70 - 99 mg/dL  ?  Comment: Glucose reference range applies only to samples taken after fasting for at least 8 hours.  ?Glucose, capillary     Status: Abnormal  ? Collection Time: 03/09/22  7:35 AM  ?Result Value Ref Range  ? Glucose-Capillary 101 (H) 70 - 99 mg/dL  ?  Comment: Glucose reference range applies only to samples taken after fasting for at least 8 hours.  ?Glucose, capillary     Status: Abnormal  ? Collection Time: 03/09/22 11:34 AM  ?Result Value Ref Range  ? Glucose-Capillary 105 (H) 70 - 99 mg/dL  ?  Comment: Glucose reference range applies only to samples taken after fasting for at least 8 hours.  ?Glucose, capillary     Status: Abnormal  ? Collection Time: 03/09/22  4:23 PM  ?Result Value Ref Range  ? Glucose-Capillary 107 (H) 70 - 99 mg/dL  ?  Comment: Glucose reference range applies only to samples taken after fasting for at least 8 hours.  ?Glucose, capillary     Status: Abnormal  ? Collection Time: 03/09/22  7:21 PM  ?Result Value Ref Range  ? Glucose-Capillary 102 (H) 70 - 99 mg/dL  ?  Comment: Glucose reference range applies only to samples taken after fasting for at least 8 hours.  ?Glucose, capillary     Status: Abnormal  ? Collection Time: 03/09/22 11:16 PM  ?Result Value Ref Range  ? Glucose-Capillary 100 (H) 70 - 99 mg/dL  ?  Comment: Glucose reference range applies only to samples taken after fasting  for at least 8 hours.  ?Glucose, capillary     Status: Abnormal  ? Collection Time: 03/10/22  3:24 AM  ?Result Value Ref Range  ? Glucose-Capillary 104 (H) 70 - 99 mg/dL  ?  Comment: Glucose reference

## 2022-03-11 ENCOUNTER — Encounter (HOSPITAL_COMMUNITY)
Admission: EM | Disposition: A | Discharge status: Rehabilitation Facility | Payer: Self-pay | Source: Home / Self Care | Attending: Neurology

## 2022-03-11 DIAGNOSIS — R569 Unspecified convulsions: Secondary | ICD-10-CM | POA: Diagnosis not present

## 2022-03-11 DIAGNOSIS — I639 Cerebral infarction, unspecified: Secondary | ICD-10-CM | POA: Diagnosis not present

## 2022-03-11 DIAGNOSIS — I6522 Occlusion and stenosis of left carotid artery: Secondary | ICD-10-CM | POA: Diagnosis not present

## 2022-03-11 DIAGNOSIS — I63232 Cerebral infarction due to unspecified occlusion or stenosis of left carotid arteries: Secondary | ICD-10-CM | POA: Diagnosis not present

## 2022-03-11 DIAGNOSIS — E440 Moderate protein-calorie malnutrition: Secondary | ICD-10-CM | POA: Diagnosis not present

## 2022-03-11 DIAGNOSIS — I63512 Cerebral infarction due to unspecified occlusion or stenosis of left middle cerebral artery: Secondary | ICD-10-CM | POA: Diagnosis not present

## 2022-03-11 HISTORY — PX: ESOPHAGOGASTRODUODENOSCOPY (EGD) WITH PROPOFOL: SHX5813

## 2022-03-11 HISTORY — PX: PEG PLACEMENT: SHX5437

## 2022-03-11 LAB — CBC
HCT: 42.7 % (ref 39.0–52.0)
Hemoglobin: 13.7 g/dL (ref 13.0–17.0)
MCH: 30 pg (ref 26.0–34.0)
MCHC: 32.1 g/dL (ref 30.0–36.0)
MCV: 93.6 fL (ref 80.0–100.0)
Platelets: 177 10*3/uL (ref 150–400)
RBC: 4.56 MIL/uL (ref 4.22–5.81)
RDW: 13.1 % (ref 11.5–15.5)
WBC: 9.4 10*3/uL (ref 4.0–10.5)
nRBC: 0 % (ref 0.0–0.2)

## 2022-03-11 LAB — BASIC METABOLIC PANEL
Anion gap: 9 (ref 5–15)
BUN: 14 mg/dL (ref 8–23)
CO2: 26 mmol/L (ref 22–32)
Calcium: 8.4 mg/dL — ABNORMAL LOW (ref 8.9–10.3)
Chloride: 108 mmol/L (ref 98–111)
Creatinine, Ser: 0.73 mg/dL (ref 0.61–1.24)
GFR, Estimated: >60 mL/min (ref >60)
Glucose, Bld: 87 mg/dL (ref 70–99)
Potassium: 3.7 mmol/L (ref 3.5–5.1)
Sodium: 143 mmol/L (ref 135–145)

## 2022-03-11 LAB — GLUCOSE, CAPILLARY
Glucose-Capillary: 79 mg/dL (ref 70–99)
Glucose-Capillary: 89 mg/dL (ref 70–99)
Glucose-Capillary: 83 mg/dL (ref 70–99)
Glucose-Capillary: 81 mg/dL (ref 70–99)
Glucose-Capillary: 116 mg/dL — ABNORMAL HIGH (ref 70–99)
Glucose-Capillary: 108 mg/dL — ABNORMAL HIGH (ref 70–99)

## 2022-03-11 LAB — MAGNESIUM: Magnesium: 2.3 mg/dL (ref 1.7–2.4)

## 2022-03-11 SURGERY — ESOPHAGOGASTRODUODENOSCOPY (EGD) WITH PROPOFOL
Anesthesia: Monitor Anesthesia Care

## 2022-03-11 MED ORDER — FREE WATER
200.0000 mL | Status: AC
  Administered 2022-03-11 – 2022-03-12 (×5): 200 mL

## 2022-03-11 MED ORDER — ASPIRIN 81 MG PO CHEW
81.0000 mg | CHEWABLE_TABLET | Freq: Every day | ORAL | Status: DC
  Administered 2022-03-11 – 2022-03-22 (×12): 81 mg
  Filled 2022-03-11 (×12): qty 1

## 2022-03-11 MED ORDER — CLOPIDOGREL BISULFATE 75 MG PO TABS
75.0000 mg | ORAL_TABLET | Freq: Every day | ORAL | Status: DC
  Administered 2022-03-11 – 2022-03-22 (×12): 75 mg
  Filled 2022-03-11 (×12): qty 1

## 2022-03-11 MED ORDER — BETHANECHOL CHLORIDE 10 MG PO TABS
10.0000 mg | ORAL_TABLET | Freq: Three times a day (TID) | ORAL | Status: AC
  Administered 2022-03-11 – 2022-03-14 (×9): 10 mg
  Filled 2022-03-11 (×9): qty 1

## 2022-03-11 MED ORDER — PROSOURCE TF PO LIQD
90.0000 mL | Freq: Two times a day (BID) | ORAL | Status: DC
  Administered 2022-03-11 – 2022-03-22 (×22): 90 mL
  Filled 2022-03-11 (×22): qty 90

## 2022-03-11 MED ORDER — ADULT MULTIVITAMIN W/MINERALS CH
1.0000 | ORAL_TABLET | Freq: Every day | ORAL | Status: DC
  Administered 2022-03-11 – 2022-03-22 (×12): 1
  Filled 2022-03-11 (×12): qty 1

## 2022-03-11 MED ORDER — FREE WATER: 200.0000 mL | Status: DC

## 2022-03-11 MED ORDER — ATORVASTATIN CALCIUM 40 MG PO TABS
40.0000 mg | ORAL_TABLET | Freq: Every day | ORAL | Status: DC
  Administered 2022-03-11 – 2022-03-22 (×12): 40 mg
  Filled 2022-03-11 (×12): qty 1

## 2022-03-11 MED ORDER — FENTANYL CITRATE PF 50 MCG/ML IJ SOSY
100.0000 ug | PREFILLED_SYRINGE | Freq: Once | INTRAMUSCULAR | Status: AC
  Administered 2022-03-11: 100 ug via INTRAVENOUS
  Filled 2022-03-11: qty 2

## 2022-03-11 MED ORDER — ROCURONIUM BROMIDE 10 MG/ML (PF) SYRINGE
100.0000 mg | PREFILLED_SYRINGE | Freq: Once | INTRAVENOUS | Status: AC
  Administered 2022-03-11: 100 mg via INTRAVENOUS
  Filled 2022-03-11: qty 10

## 2022-03-11 MED ORDER — FUROSEMIDE 10 MG/ML IJ SOLN: 40.0000 mg | Freq: Once | INTRAMUSCULAR | Status: DC

## 2022-03-11 NOTE — Progress Notes (Signed)
SLP Cancellation Note ? ?Patient Details ?Name: Todd Estrada ?MRN: [id number] ?DOB: 1961-10-03 ? ? ?Cancelled treatment:       Reason Eval/Treat Not Completed: Patient not medically ready (Pt's case discussed with RN who advised that pt is scheduled for G-tube placement at 1000 and that weaning to ATC today appears unlikely. SLP will follow up next week unless contacted sooner due to readinees.) ? ?Carley Glendenning I. Hardin Negus, [place], CCC-SLP ?Acute Rehabilitation Services ?Office number [phone] ?Pager [phone] ? ?[name] ?03/11/2022, 9:15 AM ?

## 2022-03-11 NOTE — Progress Notes (Addendum)
STROKE TEAM PROGRESS NOTE  ? ?INTERVAL HISTORY ?Patient is seen in his room with no family at the bedside. He has had a PEG tube placed today and is sedated with propofol.  HE has been hemodynamically stable and has had no acute events overnight. ? ? ?Vitals:  ? 03/11/22 1018 03/11/22 1100 03/11/22 1128 03/11/22 1200  ?BP: 128/67 115/62  101/60  ?Pulse: 66 (!) 58  (!) 57  ?Resp: 16 16  17   ?Temp:      ?TempSrc:      ?SpO2: 95% 100% 98% 95%  ?Weight:      ?Height:      ? ?CBC:  ?Recent Labs  ?Lab 03/10/22 ?0459 03/11/22 ?0253  ?WBC 8.8 9.4  ?HGB 14.1 13.7  ?HCT 42.9 42.7  ?MCV 92.5 93.6  ?PLT 191 177  ? ? ?Basic Metabolic Panel:  ?Recent Labs  ?Lab 03/04/22 ?1857 03/05/22 ?0455 03/10/22 ?1610 03/11/22 ?0253  ?NA  --    < > 143 143  ?K  --    < > 3.2* 3.7  ?CL  --    < > 106 108  ?CO2  --    < > 29 26  ?GLUCOSE  --    < > 102* 87  ?BUN  --    < > 16 14  ?CREATININE  --    < > 0.78 0.73  ?CALCIUM  --    < > 8.3* 8.4*  ?MG 2.3  --   --  2.3  ?PHOS 3.9  --   --   --   ? < > = values in this interval not displayed.  ? ? ?Lipid Panel:  ?Recent Labs  ?Lab 03/09/22 ?0301  ?TRIG 106  ? ? ? ?IMAGING past 24 hours ?DG Abd 1 View ? ?Result Date: 03/04/2022 ?CLINICAL DATA:  Stroke Pre MRI screening EXAM: ABDOMEN - 1 VIEW COMPARISON:  None. FINDINGS: No dilated loops of bowel to indicate ileus or obstruction. Extensive atherosclerotic arterial calcifications are noted. Mild degenerative changes present lower lumbar spine. Enteric tube extensive left upper quadrant in the expected site of the stomach. No abnormal metallic densities are identified, however evaluation is limited due to multiple overlying external artifacts. IMPRESSION: No metallic density foreign body identified within the visualized abdomen. Electronically Signed   By: Miachel Roux M.D.   On: 03/04/2022 13:32  ? ?CT HEAD WO CONTRAST (5MM) ? ?Result Date: 03/05/2022 ?CLINICAL DATA:  Stroke follow-up EXAM: CT HEAD WITHOUT CONTRAST TECHNIQUE: Contiguous axial images were  obtained from the base of the skull through the vertex without intravenous contrast. RADIATION DOSE REDUCTION: This exam was performed according to the departmental dose-optimization program which includes automated exposure control, adjustment of the mA and/or kV according to patient size and/or use of iterative reconstruction technique. COMPARISON:  MRI brain 03/04/2022 FINDINGS: Brain: Subacute left MCA territory infarct with unchanged areas of cortical hypoattenuation. Encephalomalacia in the left PCA territory is unchanged. No acute hemorrhage or mass effect. Vascular: No abnormal hyperdensity of the major intracranial arteries or dural venous sinuses. No intracranial atherosclerosis. Skull: The visualized skull base, calvarium and extracranial soft tissues are normal. Sinuses/Orbits: Opacification of the maxillary and sphenoid sinuses. The orbits are normal. IMPRESSION: Subacute left MCA territory infarct without acute hemorrhage or mass effect. Electronically Signed   By: Ulyses Jarred M.D.   On: 03/05/2022 01:03  ? ?CT HEAD WO CONTRAST ? ?Result Date: 03/02/2022 ?CLINICAL DATA:  Seizure like activity, postictal, history of alcohol abuse EXAM: CT  HEAD WITHOUT CONTRAST TECHNIQUE: Contiguous axial images were obtained from the base of the skull through the vertex without intravenous contrast. RADIATION DOSE REDUCTION: This exam was performed according to the departmental dose-optimization program which includes automated exposure control, adjustment of the mA and/or kV according to patient size and/or use of iterative reconstruction technique. COMPARISON:  08/20/2021 FINDINGS: Brain: Chronic left MCA territory infarct again identified, with interval dystrophic calcification in the infarct bed since prior study. Chronic ischemic changes are again identified throughout the periventricular and subcortical white matter, left basal ganglia, and left cerebellar hemisphere. No evidence of acute infarct or hemorrhage.  Lateral ventricles and remaining midline structures are unremarkable. No acute extra-axial fluid collections. No mass effect. Vascular: No hyperdense vessel or unexpected calcification. Skull: Normal. Negative for fracture or focal lesion. Sinuses/Orbits: Chronic opacification right maxillary sinus. Stable mucosal thickening left maxillary sinus. Other: None. IMPRESSION: 1. Chronic ischemic changes as above. No acute intracranial process. Electronically Signed   By: Randa Ngo M.D.   On: 03/02/2022 19:57  ? ?CT CERVICAL SPINE WO CONTRAST ? ?Result Date: 03/02/2022 ?CLINICAL DATA:  Seizure like activity, history of alcohol abuse, neck trauma EXAM: CT CERVICAL SPINE WITHOUT CONTRAST TECHNIQUE: Multidetector CT imaging of the cervical spine was performed without intravenous contrast. Multiplanar CT image reconstructions were also generated. RADIATION DOSE REDUCTION: This exam was performed according to the departmental dose-optimization program which includes automated exposure control, adjustment of the mA and/or kV according to patient size and/or use of iterative reconstruction technique. COMPARISON:  None. FINDINGS: Alignment: Alignment is anatomic. Skull base and vertebrae: No acute fracture. No primary bone lesion or focal pathologic process. Soft tissues and spinal canal: No prevertebral fluid or swelling. No visible canal hematoma. Disc levels: Multilevel cervical spondylosis, with prominent anterior osteophyte formation from C4-5 through C6-7. Circumferential disc osteophyte complex at C3-4 and C6-7 results in right greater than left neural foraminal narrowing. Multilevel facet hypertrophy greatest from C4 through C6. Upper chest: Central airways patent.  Severe upper lobe emphysema. Other: Reconstructed images demonstrate no additional findings. IMPRESSION: 1. No acute cervical spine fracture. 2. Multilevel cervical spondylosis and facet hypertrophy as above. 3. Emphysema. Electronically Signed   By:  Randa Ngo M.D.   On: 03/02/2022 20:00  ? ?MR ANGIO HEAD WO CONTRAST ? ?Addendum Date: 03/04/2022   ?ADDENDUM REPORT: 03/04/2022 17:39 ADDENDUM: Findings discussed with Dr. Leonel Ramsay via telephone at 5:35 p.m. Electronically Signed   By: Margaretha Sheffield M.D.   On: 03/04/2022 17:39  ? ?Result Date: 03/04/2022 ?CLINICAL DATA:  Neuro deficit, acute, stroke suspected; Stroke, follow up EXAM: MRI HEAD WITHOUT CONTRAST MRA HEAD WITHOUT CONTRAST TECHNIQUE: Multiplanar, multi-echo pulse sequences of the brain and surrounding structures were acquired without intravenous contrast. Angiographic images of the Circle of Willis were acquired using MRA technique without intravenous contrast. COMPARISON:  No pertinent prior exam. FINDINGS: MRI HEAD FINDINGS Brain: Acute left MCA territory infarcts predominantly involving the left frontal, parietal and temporal lobes. Infarcts involve both cortex and white matter. Small acute infarct involving the left insula. Associated edema and sulcal effacement without midline shift. Areas of susceptibility artifact and T2 hypointensity. Areas of T2 hypointensity, T1 hyperintensity and susceptibility artifact is concerning for acute hemorrhage although an element prior hemorrhage/cortical laminar necrosis is possible posteriorly. No hydrocephalus. Vascular: Major arterial flow voids are maintained at the skull base. Skull and upper cervical spine: Normal marrow signal. Sinuses/Orbits: Paranasal sinus mucosal thickening. No acute orbital findings. Other: Small left greater than right mastoid effusions. MRA  HEAD FINDINGS Anterior circulation: The intracranial ICAs are now patent. Left ICAs attenuated, likely due to pre-existing atherosclerosis with probably moderate to severe paraclinoid ICA stenosis. Bilateral MCAs and ACAs are patent without proximal hemodynamically significant stenosis. Posterior circulation: Chronically occluded left intradural vertebral artery. Right intradural  vertebral artery, basilar artery and posterior cerebral arteries are patent without proximal hemodynamically significant stenosis. Right fetal type PCA, anatomic variant. Anatomic variants: As detailed above. IMPR

## 2022-03-11 NOTE — Progress Notes (Signed)
Patient ID: Todd Estrada, male   DOB: 1960/12/21, 61 y.o.   MRN: [id number] ?9 Days Post-Op  ?  ?Subjective: ?On vent ?ROS negative except as listed above. ?Objective: ?Vital signs in last 24 hours: ?Temp:  [98.6 ?F (37 ?C)-99.6 ?F (37.6 ?C)] 99.6 ?F (37.6 ?C) (03/24 0400) ?Pulse Rate:  [55-105] 60 (03/24 0800) ?Resp:  [15-25] 17 (03/24 0800) ?BP: (97-147)/(59-79) 147/78 (03/24 0800) ?SpO2:  [90 %-100 %] 100 % (03/24 0800) ?FiO2 (%):  [40 %] 40 % (03/24 0800) ?Weight:  [77.8 kg] 77.8 kg (03/24 0500) ?Last BM Date : 03/09/22 ? ?Intake/Output from previous day: ?03/23 0701 - 03/24 0700 ?In: 3530.5 [I.V.:1329.8; IV Piggyback:2200.7] ?Out: 353 [Urine:915] ?Intake/Output this shift: ?No intake/output data recorded. ? ?General appearance: no distress ?Resp: clear to auscultation bilaterally ?Cardio: regular rate and rhythm ?GI: soft, NT ? ?Lab Results: ?CBC  ?Recent Labs  ?  03/10/22 ?0459 03/11/22 ?0253  ?WBC 8.8 9.4  ?HGB 14.1 13.7  ?HCT 42.9 42.7  ?PLT 191 177  ? ?BMET ?Recent Labs  ?  03/10/22 ?0459 03/11/22 ?0253  ?NA 143 143  ?K 3.2* 3.7  ?CL 106 108  ?CO2 29 26  ?GLUCOSE 102* 87  ?BUN 16 14  ?CREATININE 0.78 0.73  ?CALCIUM 8.3* 8.4*  ? ?PT/INR ?No results for input(s): LABPROT, INR in the last 72 hours. ?ABG ?Recent Labs  ?  03/08/22 ?1732  ?PHART 7.378  ?HCO3 32.8*  ? ? ?Studies/Results: ?DG Chest Port 1 View ? ?Result Date: 03/09/2022 ?CLINICAL DATA:  Placement of tracheostomy tube EXAM: PORTABLE CHEST 1 VIEW COMPARISON:  Previous studies including the examination of 03/08/2022 FINDINGS: There is interval placement of tracheostomy tube with its tip approximately 8.4 cm above the carina. Cardiac size is within normal limits. Nodular density in the left parahilar region has not changed. There are linear densities in the right mid and right lower lung fields with no significant change. Right lateral CP angle is not included in the radiograph. There is no pneumothorax. IMPRESSION: Interval placement of tracheostomy  tube. No significant interval changes are noted in the lung fields. Electronically Signed   By: Elmer Picker M.D.   On: 03/09/2022 14:27  ? ?DG Naso G Tube Plc W/Fl W/Rad ? ?Result Date: 03/09/2022 ?CLINICAL DATA:  Clinical service unable to place tube through the nose or beyond. EXAM: NASO G TUBE PLACEMENT WITH FL AND WITH RAD FLUOROSCOPY: Fluoroscopy Time:  6 minutes Radiation Exposure Index (if provided by the fluoroscopic device): 89.50 mGy Number of Acquired Spot Images: 1 COMPARISON:  None. FINDINGS: We were able to pass a tube and wire through the nasopharynx. However, using multiple personnel with multiple attempts we were unable to gain passage into the esophagus. Tube and wire repeatedly entered the trachea despite all interventions. Therefore, the tube placement was a failure and the tube and wire were removed. IMPRESSION: We were unable to negotiate the tube into the esophagus. Preferential entrance into the trachea could not be avoided. Therefore, nothing was left in place. Electronically Signed   By: Nelson Chimes M.D.   On: 03/09/2022 16:34   ? ?Anti-infectives: ?Anti-infectives (From admission, onward)  ? ? Start     Dose/Rate Route Frequency Ordered Stop  ? 03/03/22 1315  Ampicillin-Sulbactam (UNASYN) 3 g in sodium chloride 0.9 % 100 mL IVPB       ? 3 g ?200 mL/hr over 30 Minutes Intravenous Every 6 hours 03/03/22 1216 03/10/22 0658  ? 03/02/22 2130  ceFAZolin (ANCEF) 2-4  GM/100ML-% IVPB       ?Note to Pharmacy: Domenick Bookbinder E: cabinet override  ?    03/02/22 2130 03/03/22 0944  ? ?  ? ? ?Assessment/Plan: ?CVA and need for enteral access - for bedside PEG today. Procedure, risks, and benefits D/W his cousin and consent done. ? LOS: 9 days  ? ? ?Georganna Skeans, MD, MPH, FACS ?Trauma & General Surgery ?Use [email or web address] to contact on call provider ? ?03/11/2022  ?

## 2022-03-11 NOTE — Progress Notes (Signed)
OT Cancellation Note ? ?Patient Details ?Name: Todd Estrada ?MRN: [id number] ?DOB: 1961/04/14 ? ? ?Cancelled Treatment:    Reason Eval/Treat Not Completed: Patient at procedure or test/ unavailable (Just had Peg placed. Will check back later time.) ? ?Kaede Clendenen,[facility] ?03/11/2022, 11:06 AM ?Maurie Boettcher, OT/L  ? ?Acute OT Clinical Specialist ?Acute Rehabilitation Services ?Pager [phone] ?Office [phone]  ?

## 2022-03-11 NOTE — Progress Notes (Signed)
? ?NAME:  Todd Estrada, MRN:  [id number], DOB:  04-Apr-1961, LOS: 86 ?ADMISSION DATE:  03/02/2022, CONSULTATION DATE:  3/16 ?REFERRING MD:  Erlinda Hong, CHIEF COMPLAINT:  Confusion, seizure  ? ?History of Present Illness:  ?61 y/o male with a history of Left MCA stroke in 8341 with a complicated post stroke course presented on 3/15 with a 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  Intubated on 3/16 for airway protection. ? ?Pertinent  Medical History  ?Hyeprtension ?Left MCA stroke ?Chronic combined CHF ? ?Significant Hospital Events: ?Including procedures, antibiotic start and stop dates in addition to other pertinent events   ?3/15 admitted for acute L MCA stroke due to L ICA occlusion with first time seizure s/p revascularization of L ICA occlusion ?3/16 Intubated for airway protection; started unasyn; echo LVEF 40-45%, RVSP normal ?3/17 MRI Brain showed acute infarct in left frontal, temporal and parietal lobes with findings worrisome for acute infarct, left vert occluded; CT head did not show hemorrhage; respiratory notes thick secretions,  ?3/18 following some commands on exam, extubated the re-intubated due to hypoxemia, inability to clear secretions ?3/19 self extubated overnight, remains on NRB. Rhino rocket placed for R nare epistaxis  ?3/21 required reintubation for inability to protect airway and aspiration  ?3/22 bedside perc tracheostomy with bronchoscopy  ? ?Interim History / Subjective:  ?Patient is on vent support via trach this morning.  Was only able to tolerate trach collar for few hours yesterday. Remains on propofol gtt for sedation  ?  ?Objective   ?Blood pressure 116/66, pulse (!) 55, temperature 99.6 ?F (37.6 ?C), temperature source Oral, resp. rate 18, height 6' (1.829 m), weight 77.8 kg, SpO2 92 %. ?   ?Vent Mode: PRVC ?FiO2 (%):  [40 %] 40 % ?Set Rate:  [16 bmp] 16 bmp ?Vt Set:  [620 mL] 620 mL ?PEEP:  [5 cmH20] 5 cmH20 ?Pressure Support:  [8 cmH20-10 cmH20] 8 cmH20 ?Plateau  Pressure:  [13 cmH20-15 cmH20] 13 cmH20  ? ?Intake/Output Summary (Last 24 hours) at 03/11/2022 0737 ?Last data filed at 03/11/2022 0600 ?Gross per 24 hour  ?Intake 3530.49 ml  ?Output 915 ml  ?Net 2615.49 ml  ? ?Filed Weights  ? 03/09/22 0600 03/10/22 0300 03/11/22 0500  ?Weight: 78.7 kg 77.9 kg 77.8 kg  ? ? ?Examination: ? General:  acute on chronically ill appearing and unkept elderly male, somnolent but arouses to physical stimuli ?HENT: NCAT dried blood in bilat nares, dry MM  ?PULM: Diffuse rhonchi; no wheezing noted, tolerating pressure support on trach  ?CV: RRR, no mgr ?GI: BS+, soft, nontender ?MSK: normal bulk and tone ?Neuro: somnolent but arousable to physical stimuli ; PERRL  ? ?Resolved Hospital Problem list   ?Septic shock due to aspiration pneumonia> resolved ?Urinary retention ?Assessment & Plan:  ?Acute encephalopathy due to left MCA stroke from left ICA occlusion requiring thrombectomy and stent placement on 3/15 ?Seizure disorder ?Agitation delirium; completed phenobarb for possible ETOH withdrawal ?- Continue Keppra ?- Continue to monitor neuro exam ?- Secondary stroke prevention per neurology/stroke service ?- BP goals and glucose goals per stroke service ?- Continue statin and aspirin; unable to take PO brilinta at this time  ?- PT/OT/SLP ?- Cautiously use propofol for sedation - will attempt to titrate down in setting of bradycardia ? ?Acute hypoxemic respiratory failure due to aspiration pneumonia s/p trach ?Concern for emphysema/COPD  ?Aspiration risk ?Patient high aspiration risk with prior stroke with new left MCA stroke underwent bedside percutaneous trach on  3/23. Has completed 7d course of Unasyn. Only tolerated few hours of trach collar yesterday. Remains on full vent support this morning.  ?-Trach collar as tolerated  ?- Continue guaifenesin and duonebs  ?- Oxygen supplement for goal SpO2 88-92% ?- PEG tube placement today  ? ?Hypokalemia - improved ?- Monitor and replete prn  ? ?RUE  thrombophlebitis ?Warm compresses prn ? ?Combined systolic and diastolic heart failure, chronic ?Hypertension ?Appears euvolemic - Continue to monitor hemodynamics ?hydralazine prn SBP > 160 ? ?Best Practice (right click and "Reselect all SmartList Selections" daily)  ? ?Diet/type: NPO ?DVT prophylaxis: LMWH ?GI prophylaxis: PPI ?Lines: N/A ?Foley:  N/A ?Code Status:  full code ?Last date of multidisciplinary goals of care discussion [per stroke service] ? ?Labs   ?CBC: ?Recent Labs  ?Lab 03/07/22 ?3212 03/08/22 ?2482 03/08/22 ?1732 03/09/22 ?0301 03/10/22 ?5003 03/11/22 ?7048  ?WBC 9.4 7.3  --  9.3 8.8 9.4  ?HGB 14.1 15.3 16.0 14.6 14.1 13.7  ?HCT 43.1 45.6 47.0 43.2 42.9 42.7  ?MCV 92.9 89.9  --  90.8 92.5 93.6  ?PLT 138* 149*  --  185 191 177  ? ? ?Basic Metabolic Panel: ?Recent Labs  ?Lab 03/04/22 ?1857 03/05/22 ?8891 03/07/22 ?6945 03/08/22 ?0388 03/08/22 ?1732 03/09/22 ?0301 03/10/22 ?8280 03/11/22 ?0349  ?NA  --    < > 139 140 139 138 143 143  ?K  --    < > 4.1 3.2* 3.3* 3.0* 3.2* 3.7  ?CL  --    < > 103 101  --  98 106 108  ?CO2  --    < > 28 30  --  27 29 26   ?GLUCOSE  --    < > 126* 127*  --  107* 102* 87  ?BUN  --    < > 9 9  --  20 16 14   ?CREATININE  --    < > 0.66 0.68  --  0.84 0.78 0.73  ?CALCIUM  --    < > 8.7* 8.6*  --  8.4* 8.3* 8.4*  ?MG 2.3  --   --   --   --   --   --  2.3  ?PHOS 3.9  --   --   --   --   --   --   --   ? < > = values in this interval not displayed.  ? ?GFR: ?Estimated Creatinine Clearance: 106.4 mL/min (by C-G formula based on SCr of 0.73 mg/dL). ?Recent Labs  ?Lab 03/08/22 ?1791 03/09/22 ?0301 03/10/22 ?0459 03/11/22 ?0253  ?WBC 7.3 9.3 8.8 9.4  ? ? ?Liver Function Tests: ?Recent Labs  ?Lab 03/10/22 ?5056  ?AST 16  ?ALT 14  ?ALKPHOS 47  ?BILITOT 1.2  ?PROT 6.0*  ?ALBUMIN 2.5*  ? ?No results for input(s): LIPASE, AMYLASE in the last 168 hours. ?No results for input(s): AMMONIA in the last 168 hours. ? ?ABG ?   ?Component Value Date/Time  ? PHART 7.378 03/08/2022 1732  ? PCO2ART  55.9 (H) 03/08/2022 1732  ? PO2ART 98 03/08/2022 1732  ? HCO3 32.8 (H) 03/08/2022 1732  ? TCO2 34 (H) 03/08/2022 1732  ? O2SAT 97 03/08/2022 1732  ?  ? ?Coagulation Profile: ?No results for input(s): INR, PROTIME in the last 168 hours. ? ?Cardiac Enzymes: ?No results for input(s): CKTOTAL, CKMB, CKMBINDEX, TROPONINI in the last 168 hours. ? ?HbA1C: ?Hgb A1c MFr Bld  ?Date/Time Value Ref Range Status  ?03/03/2022 04:30 AM 5.4 4.8 - 5.6 % Final  ?  Comment:  ?  (NOTE) ?        Prediabetes: 5.7 - 6.4 ?        Diabetes: >6.4 ?        Glycemic control for adults with diabetes: <7.0 ?  ?08/19/2021 07:49 AM 5.5 4.8 - 5.6 % Final  ?  Comment:  ?  (NOTE) ?Pre diabetes:          5.7%-6.4% ? ?Diabetes:              >6.4% ? ?Glycemic control for   <7.0% ?adults with diabetes ?  ? ? ?CBG: ?Recent Labs  ?Lab 03/10/22 ?1131 03/10/22 ?1552 03/10/22 ?1919 03/10/22 ?2315 03/11/22 ?9450  ?GLUCAP 91 96 86 91 79  ? ?Critical care time:  ?  ? ? ? ? ? ? ?

## 2022-03-11 NOTE — Progress Notes (Signed)
PT Cancellation Note ? ?Patient Details ?Name: Todd Estrada ?MRN: [id number] ?DOB: November 05, 1961 ? ? ?Cancelled Treatment:    Reason Eval/Treat Not Completed: Medical issues which prohibited therapy; patient just had PEG placed.  Will check back as schedule permits and pt able to participate.  ? ? ?Reginia Naas ?03/11/2022, 11:48 AM ?Magda Kiel, PT ?Acute Rehabilitation Services ?ECXFQ:[phone] ?Office:[phone] ?03/11/2022 ? ?

## 2022-03-11 NOTE — Op Note (Signed)
[facility] ?Patient Name: Todd Estrada ?Procedure Date : 03/11/2022 ?MRN: [id number] ?Attending MD: Georganna Skeans , MD ?Date of Birth: [birth year] ?CSN: [id number] ?Age: 61 ?Admit Type: Inpatient ?Procedure:                Upper GI endoscopy ?Indications:              Place PEG because patient is unable to eat due to  ?                          stroke (CVA) ?Providers:                Georganna Skeans, MD, Saverio Danker, PA-C, Hayleigh  ?                          [place], RN, [employer],  ?                          Technician, Gloris Ham, Technician ?Referring MD:              ?Medicines:                Fentanyl 100 micrograms IV ?Complications:            No immediate complications. ?Estimated Blood Loss:     Estimated blood loss was minimal. ?Procedure:                Pre-Anesthesia Assessment: ?                          - Prior to the procedure, a History and Physical  ?                          was performed, and patient medications and  ?                          allergies were reviewed. The patient is unable to  ?                          give consent secondary to the patient's altered  ?                          mental status. The risks and benefits of the  ?                          procedure and the sedation options and risks were  ?                          discussed with the patient's relative. All  ?                          questions were answered and informed consent was  ?                          obtained. Patient identification and proposed  ?  procedure were verified by the physician, the nurse  ?                          and the technician in the procedure room. Mental  ?                          Status Examination: lethargic. ASA Grade  ?                          Assessment: III - A patient with severe systemic  ?                          disease. After reviewing the risks and benefits,  ?                          the patient was deemed in  satisfactory condition to  ?                          undergo the procedure. The anesthesia plan was to  ?                          use deep sedation / analgesia. Immediately prior to  ?                          administration of medications, the patient was  ?                          re-assessed for adequacy to receive sedatives. The  ?                          heart rate, respiratory rate, oxygen saturations,  ?                          blood pressure, adequacy of pulmonary ventilation,  ?                          and response to care were monitored throughout the  ?                          procedure. The physical status of the patient was  ?                          re-assessed after the procedure. ?                          After obtaining informed consent, the endoscope was  ?                          passed under direct vision. Throughout the  ?                          procedure, the patient's blood pressure, pulse, and  ?  oxygen saturations were monitored continuously. The  ?                          [id number]-[id number] ([id number]) Olympus endoscope was introduced  ?                          through the mouth, and advanced to the second part  ?                          of duodenum. The upper GI endoscopy was  ?                          accomplished without difficulty. The patient  ?                          tolerated the procedure well. ?Scope In: ?Scope Out: ?Findings: ?     No gross lesions were noted in the esophagus. ?     No gross lesions were noted in the stomach. Placement of an externally  ?     removable PEG with no T-fasteners was successfully completed. The  ?     external bumper was at the 4.5 cm marking on the tube. Estimated blood  ?     loss was minimal. ?     No gross lesions were noted in the second portion of the duodenum. ?Impression:               - No gross lesions in esophagus. ?                          - No gross lesions in the stomach. ?                          - No  gross lesions in the second portion of the  ?                          duodenum. ?                          - An externally removable PEG placement was  ?                          successfully completed. ?                          - No specimens collected. ?Recommendation:           meds OK now, tube feeds in 4H, binder loosely at  ?                          all times to protect PEG ?Procedure Code(s):        --- Professional --- ?                          [phone], Esophagogastroduodenoscopy, flexible,  ?                          transoral;  with directed placement of percutaneous  ?                          gastrostomy tube ?Diagnosis Code(s):        --- Professional --- ?                          Q59.563, Other sequelae of cerebral infarction ?                          R63.3, Feeding difficulties ?                          Z43.1, Encounter for attention to gastrostomy ?CPT copyright 2019 [employer]. All rights reserved. ?The codes documented in this report are preliminary and upon coder review may  ?be revised to meet current compliance requirements. ?Georganna Skeans, MD ?03/11/2022 11:03:38 AM ?This report has been signed electronically. ?Number of Addenda: 0 ?

## 2022-03-11 NOTE — Progress Notes (Signed)
Nutrition Follow-up ? ?DOCUMENTATION CODES:  ? ?Non-severe (moderate) malnutrition in context of chronic illness ? ?INTERVENTION:  ? ?Once PEG ready for use, initiate tube feeds: ?- Start Osmolite 1.5 @ 25 ml/hr and advance by 10 ml q 6 hours to goal rate of 55 ml/hr (1320 ml/day) ?- ProSource TF 45 ml QID ?  ?Tube feeding regimen at goal rate provides 2140 kcal, 133 grams of protein, and 1008 ml of H2O.  ?  ?- MVI with minerals daily per tube ? ?NUTRITION DIAGNOSIS:  ? ?Moderate Malnutrition related to chronic illness (L MCA stroke) as evidenced by moderate fat depletion, moderate muscle depletion. ?Ongoing.  ? ?GOAL:  ? ?Patient will meet greater than or equal to 90% of their needs ?Met with TF at goal.  ? ?MONITOR:  ? ?TF tolerance ? ?REASON FOR ASSESSMENT:  ? ?Consult, Ventilator ?Enteral/tube feeding initiation and management ? ?ASSESSMENT:  ? ?Pt with PMH of HTN, CHF, L MCA stroke 2022 with mild to moderate aphasia and R sided weakness now admitted with L MCA due to L ICA occlusion and seizure-like activity. ? ?Pt discussed during ICU rounds and with RN.  ?Pt weaning to trach collar, tolerated for a few hours 3/23 ? ?03/18 - extubated, later reintubated ?03/19 - pt self-extubated, NG tube placement attempted but unsuccessful, R nare epistaxis, rhino rocket placed ?03/20 - Cortrak not attempted due to rhino rocket ?3/21 - intubated  ?3/22 - trach placed, cortrak and IR unable to place nasogastric tube ?3/24 - PEG placed ? ? ?Medications reviewed and include: folic acid, protonix, miralax, thiamine  ? ?Labs reviewed ?  ? ?Diet Order:   ?Diet Order   ? ?       ?  Diet NPO time specified Except for: Other (See Comments)  Diet effective now       ?  ? ?  ?  ? ?  ? ? ?EDUCATION NEEDS:  ? ?Not appropriate for education at this time ? ?Skin:  Skin Assessment: Reviewed RN Assessment (tongue with wound) ?Skin Integrity Issues:: Stage II ?Stage II: NA ? ?Last BM:  3/22 ? ?Height:  ? ?Ht Readings from Last 1 Encounters:   ?03/05/22 6' (1.829 m)  ? ? ?Weight:  ? ?Wt Readings from Last 1 Encounters:  ?03/11/22 77.8 kg  ? ? ?BMI:  Body mass index is 23.26 kg/m?. ? ?Estimated Nutritional Needs:  ? ?Kcal:  2100-2300 ? ?Protein:  125-140 grams ? ?Fluid:  > 2 L/day ? ?Lockie Pares., RD, LDN, CNSC ?See AMiON for contact information  ? ?

## 2022-03-11 NOTE — Progress Notes (Signed)
   Inpatient Rehab Admissions Coordinator :  Per therapy recommendations patient was screened for CIR candidacy by Wayne Brunker RN MSN. Patient is not yet at a level to tolerate the intensity required to pursue a CIR admit . Patient may have the potential to progress to become a candidate. The CIR admissions team will follow and monitor for progress and place a Rehab Consult order if felt to be appropriate. Please contact me with any questions.  Marisol Glazer RN MSN Admissions Coordinator [phone]  

## 2022-03-12 DIAGNOSIS — I63512 Cerebral infarction due to unspecified occlusion or stenosis of left middle cerebral artery: Secondary | ICD-10-CM | POA: Diagnosis not present

## 2022-03-12 DIAGNOSIS — I6522 Occlusion and stenosis of left carotid artery: Secondary | ICD-10-CM | POA: Diagnosis not present

## 2022-03-12 LAB — GLUCOSE, CAPILLARY
Glucose-Capillary: 123 mg/dL — ABNORMAL HIGH (ref 70–99)
Glucose-Capillary: 149 mg/dL — ABNORMAL HIGH (ref 70–99)
Glucose-Capillary: 136 mg/dL — ABNORMAL HIGH (ref 70–99)
Glucose-Capillary: 145 mg/dL — ABNORMAL HIGH (ref 70–99)
Glucose-Capillary: 129 mg/dL — ABNORMAL HIGH (ref 70–99)

## 2022-03-12 LAB — TRIGLYCERIDES: Triglycerides: 69 mg/dL (ref <150)

## 2022-03-12 LAB — CBC
HCT: 40.9 % (ref 39.0–52.0)
Hemoglobin: 13.5 g/dL (ref 13.0–17.0)
MCH: 30.4 pg (ref 26.0–34.0)
MCHC: 33 g/dL (ref 30.0–36.0)
MCV: 92.1 fL (ref 80.0–100.0)
Platelets: 200 10*3/uL (ref 150–400)
RBC: 4.44 MIL/uL (ref 4.22–5.81)
RDW: 13 % (ref 11.5–15.5)
WBC: 10.5 10*3/uL (ref 4.0–10.5)
nRBC: 0 % (ref 0.0–0.2)

## 2022-03-12 NOTE — Progress Notes (Signed)
Patient ID: Todd Estrada, male   DOB: [birth year], 61 y.o.   MRN: [id number] ?Abd soft. PEG site OK. Continue binder loosely to protect PEG. ?Georganna Skeans, MD, MPH, FACS ?Please use [email or web address] to contact on call provider ? ?

## 2022-03-12 NOTE — Progress Notes (Addendum)
STROKE TEAM PROGRESS NOTE  ? ?INTERVAL HISTORY ?Patient is seen in his room with no family at the bedside. He has been hemodynamically stable with no acute events overnight.  His neurological exam remains stable. ? ? ?Vitals:  ? 03/12/22 0800 03/12/22 0900 03/12/22 1000 03/12/22 1100  ?BP: (!) 123/58 138/67 137/74 136/67  ?Pulse: 69 70 70 68  ?Resp: (!) 25 20 (!) 25 (!) 21  ?Temp: 98.3 ?F (36.8 ?C)     ?TempSrc: Oral     ?SpO2: 96% 95% 95% 96%  ?Weight:      ?Height:      ? ?CBC:  ?Recent Labs  ?Lab 03/11/22 ?0253 03/12/22 ?0559  ?WBC 9.4 10.5  ?HGB 13.7 13.5  ?HCT 42.7 40.9  ?MCV 93.6 92.1  ?PLT 177 200  ? ? ?Basic Metabolic Panel:  ?Recent Labs  ?Lab 03/10/22 ?0459 03/11/22 ?0253  ?NA 143 143  ?K 3.2* 3.7  ?CL 106 108  ?CO2 29 26  ?GLUCOSE 102* 87  ?BUN 16 14  ?CREATININE 0.78 0.73  ?CALCIUM 8.3* 8.4*  ?MG  --  2.3  ? ? ?Lipid Panel:  ?Recent Labs  ?Lab 03/12/22 ?0559  ?TRIG 69  ? ? ? ?IMAGING past 24 hours ?DG Abd 1 View ? ?Result Date: 03/04/2022 ?CLINICAL DATA:  Stroke Pre MRI screening EXAM: ABDOMEN - 1 VIEW COMPARISON:  None. FINDINGS: No dilated loops of bowel to indicate ileus or obstruction. Extensive atherosclerotic arterial calcifications are noted. Mild degenerative changes present lower lumbar spine. Enteric tube extensive left upper quadrant in the expected site of the stomach. No abnormal metallic densities are identified, however evaluation is limited due to multiple overlying external artifacts. IMPRESSION: No metallic density foreign body identified within the visualized abdomen. Electronically Signed   By: Miachel Roux M.D.   On: 03/04/2022 13:32  ? ?CT HEAD WO CONTRAST (5MM) ? ?Result Date: 03/05/2022 ?CLINICAL DATA:  Stroke follow-up EXAM: CT HEAD WITHOUT CONTRAST TECHNIQUE: Contiguous axial images were obtained from the base of the skull through the vertex without intravenous contrast. RADIATION DOSE REDUCTION: This exam was performed according to the departmental dose-optimization program which  includes automated exposure control, adjustment of the mA and/or kV according to patient size and/or use of iterative reconstruction technique. COMPARISON:  MRI brain 03/04/2022 FINDINGS: Brain: Subacute left MCA territory infarct with unchanged areas of cortical hypoattenuation. Encephalomalacia in the left PCA territory is unchanged. No acute hemorrhage or mass effect. Vascular: No abnormal hyperdensity of the major intracranial arteries or dural venous sinuses. No intracranial atherosclerosis. Skull: The visualized skull base, calvarium and extracranial soft tissues are normal. Sinuses/Orbits: Opacification of the maxillary and sphenoid sinuses. The orbits are normal. IMPRESSION: Subacute left MCA territory infarct without acute hemorrhage or mass effect. Electronically Signed   By: Ulyses Jarred M.D.   On: 03/05/2022 01:03  ? ?CT HEAD WO CONTRAST ? ?Result Date: 03/02/2022 ?CLINICAL DATA:  Seizure like activity, postictal, history of alcohol abuse EXAM: CT HEAD WITHOUT CONTRAST TECHNIQUE: Contiguous axial images were obtained from the base of the skull through the vertex without intravenous contrast. RADIATION DOSE REDUCTION: This exam was performed according to the departmental dose-optimization program which includes automated exposure control, adjustment of the mA and/or kV according to patient size and/or use of iterative reconstruction technique. COMPARISON:  08/20/2021 FINDINGS: Brain: Chronic left MCA territory infarct again identified, with interval dystrophic calcification in the infarct bed since prior study. Chronic ischemic changes are again identified throughout the periventricular and subcortical white matter, left  basal ganglia, and left cerebellar hemisphere. No evidence of acute infarct or hemorrhage. Lateral ventricles and remaining midline structures are unremarkable. No acute extra-axial fluid collections. No mass effect. Vascular: No hyperdense vessel or unexpected calcification. Skull:  Normal. Negative for fracture or focal lesion. Sinuses/Orbits: Chronic opacification right maxillary sinus. Stable mucosal thickening left maxillary sinus. Other: None. IMPRESSION: 1. Chronic ischemic changes as above. No acute intracranial process. Electronically Signed   By: Randa Ngo M.D.   On: 03/02/2022 19:57  ? ?CT CERVICAL SPINE WO CONTRAST ? ?Result Date: 03/02/2022 ?CLINICAL DATA:  Seizure like activity, history of alcohol abuse, neck trauma EXAM: CT CERVICAL SPINE WITHOUT CONTRAST TECHNIQUE: Multidetector CT imaging of the cervical spine was performed without intravenous contrast. Multiplanar CT image reconstructions were also generated. RADIATION DOSE REDUCTION: This exam was performed according to the departmental dose-optimization program which includes automated exposure control, adjustment of the mA and/or kV according to patient size and/or use of iterative reconstruction technique. COMPARISON:  None. FINDINGS: Alignment: Alignment is anatomic. Skull base and vertebrae: No acute fracture. No primary bone lesion or focal pathologic process. Soft tissues and spinal canal: No prevertebral fluid or swelling. No visible canal hematoma. Disc levels: Multilevel cervical spondylosis, with prominent anterior osteophyte formation from C4-5 through C6-7. Circumferential disc osteophyte complex at C3-4 and C6-7 results in right greater than left neural foraminal narrowing. Multilevel facet hypertrophy greatest from C4 through C6. Upper chest: Central airways patent.  Severe upper lobe emphysema. Other: Reconstructed images demonstrate no additional findings. IMPRESSION: 1. No acute cervical spine fracture. 2. Multilevel cervical spondylosis and facet hypertrophy as above. 3. Emphysema. Electronically Signed   By: Randa Ngo M.D.   On: 03/02/2022 20:00  ? ?MR ANGIO HEAD WO CONTRAST ? ?Addendum Date: 03/04/2022   ?ADDENDUM REPORT: 03/04/2022 17:39 ADDENDUM: Findings discussed with Dr. Leonel Ramsay via  telephone at 5:35 p.m. Electronically Signed   By: Margaretha Sheffield M.D.   On: 03/04/2022 17:39  ? ?Result Date: 03/04/2022 ?CLINICAL DATA:  Neuro deficit, acute, stroke suspected; Stroke, follow up EXAM: MRI HEAD WITHOUT CONTRAST MRA HEAD WITHOUT CONTRAST TECHNIQUE: Multiplanar, multi-echo pulse sequences of the brain and surrounding structures were acquired without intravenous contrast. Angiographic images of the Circle of Willis were acquired using MRA technique without intravenous contrast. COMPARISON:  No pertinent prior exam. FINDINGS: MRI HEAD FINDINGS Brain: Acute left MCA territory infarcts predominantly involving the left frontal, parietal and temporal lobes. Infarcts involve both cortex and white matter. Small acute infarct involving the left insula. Associated edema and sulcal effacement without midline shift. Areas of susceptibility artifact and T2 hypointensity. Areas of T2 hypointensity, T1 hyperintensity and susceptibility artifact is concerning for acute hemorrhage although an element prior hemorrhage/cortical laminar necrosis is possible posteriorly. No hydrocephalus. Vascular: Major arterial flow voids are maintained at the skull base. Skull and upper cervical spine: Normal marrow signal. Sinuses/Orbits: Paranasal sinus mucosal thickening. No acute orbital findings. Other: Small left greater than right mastoid effusions. MRA HEAD FINDINGS Anterior circulation: The intracranial ICAs are now patent. Left ICAs attenuated, likely due to pre-existing atherosclerosis with probably moderate to severe paraclinoid ICA stenosis. Bilateral MCAs and ACAs are patent without proximal hemodynamically significant stenosis. Posterior circulation: Chronically occluded left intradural vertebral artery. Right intradural vertebral artery, basilar artery and posterior cerebral arteries are patent without proximal hemodynamically significant stenosis. Right fetal type PCA, anatomic variant. Anatomic variants: As  detailed above. IMPRESSION: MRI: 1. Acute left MCA territory infarcts predominantly involving the left frontal, parietal and temporal lobes. Associated  edema and sulcal effacement without midline shift. 2. Findings co

## 2022-03-12 NOTE — Progress Notes (Signed)
Placed pt back on full support to rest overnight. HME/Ballard changed. No resp distress noted. Will continue to monitor.   ?

## 2022-03-12 NOTE — Progress Notes (Signed)
Physical Therapy Treatment ?Patient Details ?Name: Todd Estrada ?MRN: [id number] ?DOB: 03/27/1961 ?Today's Date: 03/12/2022 ? ? ?History of Present Illness 61 y/o male with a history of Left MCA stroke in 8101 with a complicated post stroke course presented on 3/15 with a 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  He was extubated following procedure, but reintubated due to respiratory failure.  Self extubated overnight 3/19, now on venturi mask.  Re-intubated on 3/21 and bronchoscopy and tracheostomy performed on 3/22. ? ?  ?PT Comments  ? ? Pt tolerates treatment well, although pushing through LUE toward R side for majority of session. Pt intermittently follows commands with L side, more reliably in supine than when sitting. Pt with copious secretions, RN present to suction multiple times during session. Pt remains flaccid on right side at this time and will benefit from continued acute PT services in an effort to reduce falls risk and caregiver burden.  ?Recommendations for follow up therapy are one component of a multi-disciplinary discharge planning process, led by the attending physician.  Recommendations may be updated based on patient status, additional functional criteria and insurance authorization. ? ?Follow Up Recommendations ? Acute inpatient rehab (3hours/day) ?  ?  ?Assistance Recommended at Discharge Frequent or constant Supervision/Assistance  ?Patient can return home with the following A lot of help with bathing/dressing/bathroom;Direct supervision/assist for medications management;Assist for transportation;Help with stairs or ramp for entrance;Two people to help with walking and/or transfers ?  ?Equipment Recommendations ? Wheelchair (measurements PT);Wheelchair cushion (measurements PT);Hospital bed (hoyer lift)  ?  ?Recommendations for Other Services   ? ? ?  ?Precautions / Restrictions Precautions ?Precautions: Fall ?Precaution Comments: mitts, posey, wrist restraint on  L, R inattention ?Restrictions ?Weight Bearing Restrictions: No  ?  ? ?Mobility ? Bed Mobility ?Overal bed mobility: Needs Assistance ?Bed Mobility: Supine to Sit, Sit to Supine ?  ?  ?Supine to sit: Max assist, HOB elevated ?Sit to supine: Total assist ?  ?  ?  ? ?Transfers ?Overall transfer level:  (deferred 2/2 safety concerns) ?  ?  ?  ?  ?  ?  ?  ?  ?  ?  ? ?Ambulation/Gait ?  ?  ?  ?  ?  ?  ?  ?  ? ? ?Stairs ?  ?  ?  ?  ?  ? ? ?Wheelchair Mobility ?  ? ?Modified Rankin (Stroke Patients Only) ?Modified Rankin (Stroke Patients Only) ?Pre-Morbid Rankin Score: Slight disability ?Modified Rankin: Severe disability ? ? ?  ?Balance Overall balance assessment: Needs assistance ?Sitting-balance support: Single extremity supported, Feet supported ?Sitting balance-Leahy Scale: Poor ?Sitting balance - Comments: pt pushing through LUE to right side ?Postural control: Right lateral lean ?  ?  ?  ?  ?  ?  ?  ?  ?  ?  ?  ?  ?  ?  ?  ? ?  ?Cognition Arousal/Alertness: Awake/alert ?Behavior During Therapy: Impulsive ?Overall Cognitive Status: Difficult to assess ?Area of Impairment: Following commands, Awareness ?  ?  ?  ?  ?  ?  ?  ?  ?  ?  ?  ?Following Commands: Follows one step commands inconsistently, Follows one step commands with increased time (follows with L side intermittently) ?  ?Awareness: Intellectual ?  ?  ?  ?  ? ?  ?Exercises   ? ?  ?General Comments General comments (skin integrity, edema, etc.): 40% FiO2, 5 PEEP. Pt with L gaze preference, does  not track past midline to right ?  ?  ? ?Pertinent Vitals/Pain Pain Assessment ?Pain Assessment: CPOT ?Facial Expression: Tense ?Body Movements: Protection ?Muscle Tension: Relaxed ?Compliance with ventilator (intubated pts.): Coughing but tolerating ?Vocalization (extubated pts.): N/A ?CPOT Total: 3  ? ? ?Home Living   ?  ?  ?  ?  ?  ?  ?  ?  ?  ?   ?  ?Prior Function    ?  ?  ?   ? ?PT Goals (current goals can now be found in the care plan section) Acute Rehab PT  Goals ?Patient Stated Goal: unable to state ?Progress towards PT goals: Progressing toward goals ? ?  ?Frequency ? ? ? Min 4X/week ? ? ? ?  ?PT Plan Current plan remains appropriate  ? ? ?Co-evaluation   ?  ?  ?  ?  ? ?  ?AM-PAC PT "6 Clicks" Mobility   ?Outcome Measure ? Help needed turning from your back to your side while in a flat bed without using bedrails?: A Lot ?Help needed moving from lying on your back to sitting on the side of a flat bed without using bedrails?: A Lot ?Help needed moving to and from a bed to a chair (including a wheelchair)?: Total ?Help needed standing up from a chair using your arms (e.g., wheelchair or bedside chair)?: Total ?Help needed to walk in hospital room?: Total ?Help needed climbing 3-5 steps with a railing? : Total ?6 Click Score: 8 ? ?  ?End of Session Equipment Utilized During Treatment: Oxygen ?Activity Tolerance: Patient tolerated treatment well ?Patient left: in bed;with call bell/phone within reach;with bed alarm set;with restraints reapplied ?Nurse Communication: Mobility status;Need for lift equipment ?PT Visit Diagnosis: Other abnormalities of gait and mobility (R26.89);Other symptoms and signs involving the nervous system (R29.898);Hemiplegia and hemiparesis ?Hemiplegia - Right/Left: Right ?Hemiplegia - dominant/non-dominant: Dominant ?Hemiplegia - caused by: Cerebral infarction ?  ? ? ?Time: 8938-1017 ?PT Time Calculation (min) (ACUTE ONLY): 30 min ? ?Charges:  $Therapeutic Activity: 23-37 mins          ?          ? ?Zenaida Niece, PT, DPT ?Acute Rehabilitation ?Pager: [phone] ?Office [phone] ? ? ? ?Zenaida Niece ?03/12/2022, 2:30 PM ? ?

## 2022-03-12 NOTE — Progress Notes (Signed)
Nutrition Follow-up ?RD working remotely. ? ? ?DOCUMENTATION CODES:  ? ?Non-severe (moderate) malnutrition in context of chronic illness ? ?INTERVENTION:  ?- continue with current TF order: Osmolite 1.5 @ 25 ml/hr to advance by 10 ml every 6 hours to goal rate of 55 ml/hr with 90 ml Prosource TF BID. ? ? ?NUTRITION DIAGNOSIS:  ? ?Moderate Malnutrition related to chronic illness (L MCA stroke) as evidenced by moderate fat depletion, moderate muscle depletion. -ongoing ? ?GOAL:  ? ?Patient will meet greater than or equal to 90% of their needs -to be met with TF regimen at goal ? ?MONITOR:  ? ?TF tolerance, Labs, Weight trends ? ?REASON FOR ASSESSMENT:  ? ?Consult ?Enteral/tube feeding initiation and management ? ?ASSESSMENT:  ? ?Pt with PMH of HTN, CHF, L MCA stroke 2022 with mild to moderate aphasia and R sided weakness now admitted with L MCA due to L ICA occlusion and seizure-like activity. ? ?Significant Events: ?03/18 - extubated, later reintubated ?03/19 - pt self-extubated, NG tube placement attempted but unsuccessful, R nare epistaxis, rhino rocket placed ?03/20 - Cortrak not attempted due to rhino rocket ?3/21 - intubated  ?3/22 - trach placed, cortrak and IR unable to place nasogastric tube ?3/24 - PEG placed ? ? ?RD working remotely today. Patient had PEG placed yesterday and was assessed on site by another RD.  ? ?Orders in place for Osmolite 1.5 @ 25 ml/hr to advance by 10 ml every 6 hours to goal rate of 55 ml/hr with 90 ml Prosource TF BID. At goal rate, this regimen will provide 2140 kcal, 127 grams protein, and 1006 ml free water.  ? ?Weight stable 3/22-3/24 and is up 6.5 lb compared to 3/24. Will monitor. Non-pitting edema to BUE documented in the edema section of flow sheet yesterday morning and evening but no documentation today. He is noted to be +3.77 L since admission.  ? ? ?Patient is currently intubated via trach on ventilator support ?MV: 11.9 L/min ?Temp (24hrs), Avg:98.9 ?F (37.2 ?C),  Min:98.3 ?F (36.8 ?C), Max:99.6 ?F (37.6 ?C) ?Propofol: off since 3/24 afternoon ? ? ?Labs reviewed; CBGs: 123 and 149 mg/dl, no BMP since 3/24.  ? ?Medications reviewed; 1 mg IV folic acid/day, 1 tablet multivitamin with minerals per tube/day, 40 mg IV protonix/day, 17 g miralax/day, 100 mg IV thiamine/day.  ? ? ?Diet Order:   ?Diet Order   ? ?       ?  Diet NPO time specified Except for: Other (See Comments)  Diet effective now       ?  ? ?  ?  ? ?  ? ? ?EDUCATION NEEDS:  ? ?Not appropriate for education at this time ? ?Skin:  Skin Assessment: Reviewed RN Assessment (tongue with wound) ?Skin Integrity Issues:: Stage II ?Stage II: NA ? ?Last BM:  3/22 (type 6, small amount) ? ?Height:  ? ?Ht Readings from Last 1 Encounters:  ?03/05/22 6' (1.829 m)  ? ? ?Weight:  ? ?Wt Readings from Last 1 Encounters:  ?03/12/22 80.6 kg  ? ? ? ?BMI:  Body mass index is 24.1 kg/m?. ? ?Estimated Nutritional Needs:  ?Kcal:  2100-2300 ?Protein:  125-140 grams ?Fluid:  > 2 L/day ? ? ? ? ?Jarome Matin, MS, RD, LDN ?Registered Dietitian II ?Inpatient Clinical Nutrition ?RD pager # and on-call/weekend pager # available in [place]  ? ?

## 2022-03-12 NOTE — Progress Notes (Signed)
? ?NAME:  Todd Estrada, MRN:  [id number], DOB:  Oct 07, 1961, LOS: 37 ?ADMISSION DATE:  03/02/2022, CONSULTATION DATE:  3/16 ?REFERRING MD:  Erlinda Hong, CHIEF COMPLAINT:  Confusion, seizure  ? ?History of Present Illness:  ?61 y/o male with a history of Left MCA stroke in 6433 with a complicated post stroke course presented on 3/15 with a 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  Intubated on 3/16 for airway protection. ? ?Pertinent  Medical History  ?Hyeprtension ?Left MCA stroke ?Chronic combined CHF ? ?Significant Hospital Events: ?Including procedures, antibiotic start and stop dates in addition to other pertinent events   ?3/15 admitted for acute L MCA stroke due to L ICA occlusion with first time seizure s/p revascularization of L ICA occlusion ?3/16 Intubated for airway protection; started unasyn; echo LVEF 40-45%, RVSP normal ?3/17 MRI Brain showed acute infarct in left frontal, temporal and parietal lobes with findings worrisome for acute infarct, left vert occluded; CT head did not show hemorrhage; respiratory notes thick secretions,  ?3/18 following some commands on exam, extubated the re-intubated due to hypoxemia, inability to clear secretions ?3/19 self extubated overnight, remains on NRB ?3/21 required reintubation for inability to protect airway and aspiration  ?3/22 bedside perc tracheostomy with bronchoscopy  ?3/24 PEG ? ?Interim History / Subjective:  ? ?More aake and alert ?Had PEG yesterday ?Off propofol, on fent/versed prn ? ?Objective   ?Blood pressure (!) 112/59, pulse 67, temperature 99.3 ?F (37.4 ?C), temperature source Oral, resp. rate 18, height 6' (1.829 m), weight 80.6 kg, SpO2 96 %. ?   ?Vent Mode: PRVC ?FiO2 (%):  [40 %] 40 % ?Set Rate:  [16 bmp] 16 bmp ?Vt Set:  [620 mL] 620 mL ?PEEP:  [5 cmH20] 5 cmH20 ?Plateau Pressure:  [13 cmH20-15 cmH20] 13 cmH20  ? ?Intake/Output Summary (Last 24 hours) at 03/12/2022 0721 ?Last data filed at 03/12/2022 0700 ?Gross per 24 hour   ?Intake 2108.39 ml  ?Output 260 ml  ?Net 1848.39 ml  ? ?Filed Weights  ? 03/10/22 0300 03/11/22 0500 03/12/22 0247  ?Weight: 77.9 kg 77.8 kg 80.6 kg  ? ? ?Examination: ? ?General:  In bed on vent ?HENT: NCAT trach in place ?PULM: Rhonchi bilaterally, vent supported breathing ?CV: RRR, no mgr ?GI: BS+, soft, nontender ?MSK: normal bulk and tone ?Neuro: moving R arm purposefully ? ? ?Resolved Hospital Problem list   ?Septic shock due to aspiration pneumonia> resolved ? ?Assessment & Plan:  ?Acute encephalopathy due to left MCA stroke from left ICA occlusion requiring thrombectomy and stent placement on 3/15 ?Seizure disorder ?Unclear if baseline etoh use ?Agitated delirium on 3/19> risk to self pulling at tubes, lines ?Keppra ?Continue to monitor neuro exam ?Secondary stroke prevention per neurology/stroke service ?BP goals and glucose goals per stroke service ?Statin, antiplatelet agent per stroke service ?Imaging per stroke service ?PT/OT/SLP ?Wean off sedation ? ?Acute hypoxemic respiratory failure due to aspiration pneumonia s/p tracheostomy ?Thick pulmonary secretions ?Concern for emphysema/COPD > not in history but exam supports ?Patient high aspiration risk with prior stroke with new left MCA stroke underwent bedside percutaneous trach on 3/23. Has completed 7d course of Unasyn. Only tolerated few hours of trach collar yesterday. Remains on full vent support this morning.  ?Change from vent to trach collar today ?Trach care per routine ?Continue supplemental oxygen ?PEG Placement today ? ?Combined systolic and diastolic heart failure, chronic ?Hypertension ?Euvolemic, continue to monitor hemodynamics ?Hydralazine prn SBP > 160 ? ?RUE thrombophlebitis ?Warm compresses, monitor ? ? ?  Best Practice (right click and "Reselect all SmartList Selections" daily)  ? ?Diet/type: tubefeeds ?DVT prophylaxis: LMWH ?GI prophylaxis: PPI ?Lines: N/A ?Foley:  N/A ?Code Status:  full code ?Last date of multidisciplinary goals of  care discussion [per stroke service] ? ?Labs   ?CBC: ?Recent Labs  ?Lab 03/08/22 ?0605 03/08/22 ?1732 03/09/22 ?0301 03/10/22 ?0459 03/11/22 ?0258 03/12/22 ?0559  ?WBC 7.3  --  9.3 8.8 9.4 10.5  ?HGB 15.3 16.0 14.6 14.1 13.7 13.5  ?HCT 45.6 47.0 43.2 42.9 42.7 40.9  ?MCV 89.9  --  90.8 92.5 93.6 92.1  ?PLT 149*  --  185 191 177 200  ? ? ?Basic Metabolic Panel: ?Recent Labs  ?Lab 03/07/22 ?5277 03/08/22 ?8242 03/08/22 ?1732 03/09/22 ?0301 03/10/22 ?3536 03/11/22 ?1443  ?NA 139 140 139 138 143 143  ?K 4.1 3.2* 3.3* 3.0* 3.2* 3.7  ?CL 103 101  --  98 106 108  ?CO2 28 30  --  27 29 26   ?GLUCOSE 126* 127*  --  107* 102* 87  ?BUN 9 9  --  20 16 14   ?CREATININE 0.66 0.68  --  0.84 0.78 0.73  ?CALCIUM 8.7* 8.6*  --  8.4* 8.3* 8.4*  ?MG  --   --   --   --   --  2.3  ? ?GFR: ?Estimated Creatinine Clearance: 106.4 mL/min (by C-G formula based on SCr of 0.73 mg/dL). ?Recent Labs  ?Lab 03/09/22 ?0301 03/10/22 ?0459 03/11/22 ?0253 03/12/22 ?0559  ?WBC 9.3 8.8 9.4 10.5  ? ? ?Liver Function Tests: ?Recent Labs  ?Lab 03/10/22 ?1540  ?AST 16  ?ALT 14  ?ALKPHOS 47  ?BILITOT 1.2  ?PROT 6.0*  ?ALBUMIN 2.5*  ? ?No results for input(s): LIPASE, AMYLASE in the last 168 hours. ?No results for input(s): AMMONIA in the last 168 hours. ? ?ABG ?   ?Component Value Date/Time  ? PHART 7.378 03/08/2022 1732  ? PCO2ART 55.9 (H) 03/08/2022 1732  ? PO2ART 98 03/08/2022 1732  ? HCO3 32.8 (H) 03/08/2022 1732  ? TCO2 34 (H) 03/08/2022 1732  ? O2SAT 97 03/08/2022 1732  ?  ? ?Coagulation Profile: ?No results for input(s): INR, PROTIME in the last 168 hours. ? ?Cardiac Enzymes: ?No results for input(s): CKTOTAL, CKMB, CKMBINDEX, TROPONINI in the last 168 hours. ? ?HbA1C: ?Hgb A1c MFr Bld  ?Date/Time Value Ref Range Status  ?03/03/2022 04:30 AM 5.4 4.8 - 5.6 % Final  ?  Comment:  ?  (NOTE) ?        Prediabetes: 5.7 - 6.4 ?        Diabetes: >6.4 ?        Glycemic control for adults with diabetes: <7.0 ?  ?08/19/2021 07:49 AM 5.5 4.8 - 5.6 % Final  ?   Comment:  ?  (NOTE) ?Pre diabetes:          5.7%-6.4% ? ?Diabetes:              >6.4% ? ?Glycemic control for   <7.0% ?adults with diabetes ?  ? ? ?CBG: ?Recent Labs  ?Lab 03/11/22 ?1135 03/11/22 ?1509 03/11/22 ?1924 03/11/22 ?2315 03/12/22 ?0319  ?GLUCAP 83 81 116* 108* 123*  ? ? ?  ? ?Critical care time: 32 minutes ?  ? ?Roselie Awkward, MD ?[facility] ?Pager: [phone] ?Cell: [phone] ?After 7:00 pm call Elink  [phone] ? ? ? ? ? ? ?

## 2022-03-12 NOTE — Progress Notes (Signed)
HME, ballard, and aerogen cup all changed due to filling up with secretions. No complications. RT will continue to monitor. ?

## 2022-03-13 DIAGNOSIS — I63512 Cerebral infarction due to unspecified occlusion or stenosis of left middle cerebral artery: Secondary | ICD-10-CM | POA: Diagnosis not present

## 2022-03-13 LAB — CBC
HCT: 39.3 % (ref 39.0–52.0)
Hemoglobin: 12.7 g/dL — ABNORMAL LOW (ref 13.0–17.0)
MCH: 30 pg (ref 26.0–34.0)
MCHC: 32.3 g/dL (ref 30.0–36.0)
MCV: 92.7 fL (ref 80.0–100.0)
Platelets: 222 10*3/uL (ref 150–400)
RBC: 4.24 MIL/uL (ref 4.22–5.81)
RDW: 12.8 % (ref 11.5–15.5)
WBC: 11.3 10*3/uL — ABNORMAL HIGH (ref 4.0–10.5)
nRBC: 0 % (ref 0.0–0.2)

## 2022-03-13 LAB — GLUCOSE, CAPILLARY
Glucose-Capillary: 121 mg/dL — ABNORMAL HIGH (ref 70–99)
Glucose-Capillary: 127 mg/dL — ABNORMAL HIGH (ref 70–99)
Glucose-Capillary: 128 mg/dL — ABNORMAL HIGH (ref 70–99)
Glucose-Capillary: 131 mg/dL — ABNORMAL HIGH (ref 70–99)
Glucose-Capillary: 143 mg/dL — ABNORMAL HIGH (ref 70–99)
Glucose-Capillary: 148 mg/dL — ABNORMAL HIGH (ref 70–99)
Glucose-Capillary: 154 mg/dL — ABNORMAL HIGH (ref 70–99)

## 2022-03-13 LAB — COMPREHENSIVE METABOLIC PANEL
ALT: 24 U/L (ref 0–44)
AST: 26 U/L (ref 15–41)
Albumin: 2.1 g/dL — ABNORMAL LOW (ref 3.5–5.0)
Alkaline Phosphatase: 81 U/L (ref 38–126)
Anion gap: 6 (ref 5–15)
BUN: 12 mg/dL (ref 8–23)
CO2: 30 mmol/L (ref 22–32)
Calcium: 8.1 mg/dL — ABNORMAL LOW (ref 8.9–10.3)
Chloride: 103 mmol/L (ref 98–111)
Creatinine, Ser: 0.57 mg/dL — ABNORMAL LOW (ref 0.61–1.24)
GFR, Estimated: >60 mL/min (ref >60)
Glucose, Bld: 127 mg/dL — ABNORMAL HIGH (ref 70–99)
Potassium: 3.5 mmol/L (ref 3.5–5.1)
Sodium: 139 mmol/L (ref 135–145)
Total Bilirubin: 0.6 mg/dL (ref 0.3–1.2)
Total Protein: 5.5 g/dL — ABNORMAL LOW (ref 6.5–8.1)

## 2022-03-13 MED ORDER — BUDESONIDE 0.5 MG/2ML IN SUSP
0.5000 mg | Freq: Two times a day (BID) | RESPIRATORY_TRACT | Status: DC
  Administered 2022-03-13 – 2022-03-22 (×19): 0.5 mg via RESPIRATORY_TRACT
  Filled 2022-03-13 (×19): qty 2

## 2022-03-13 MED ORDER — ARFORMOTEROL TARTRATE 15 MCG/2ML IN NEBU
15.0000 ug | INHALATION_SOLUTION | Freq: Two times a day (BID) | RESPIRATORY_TRACT | Status: DC
  Administered 2022-03-13 – 2022-03-22 (×19): 15 ug via RESPIRATORY_TRACT
  Filled 2022-03-13 (×19): qty 2

## 2022-03-13 MED ORDER — GUAIFENESIN 100 MG/5ML PO LIQD
15.0000 mL | Freq: Four times a day (QID) | ORAL | Status: DC
  Administered 2022-03-13 – 2022-03-14 (×4): 15 mL via ORAL
  Filled 2022-03-13 (×4): qty 15

## 2022-03-13 MED ORDER — IPRATROPIUM-ALBUTEROL 0.5-2.5 (3) MG/3ML IN SOLN
RESPIRATORY_TRACT | Status: AC
  Filled 2022-03-13: qty 3

## 2022-03-13 MED ORDER — PANTOPRAZOLE 2 MG/ML SUSPENSION
40.0000 mg | ORAL | Status: DC
  Administered 2022-03-13 – 2022-03-21 (×9): 40 mg
  Filled 2022-03-13 (×9): qty 20

## 2022-03-13 MED ORDER — FOLIC ACID 1 MG PO TABS
1.0000 mg | ORAL_TABLET | Freq: Every day | ORAL | Status: DC
Start: 2022-03-14 — End: 2022-03-22
  Administered 2022-03-14 – 2022-03-22 (×9): 1 mg
  Filled 2022-03-13 (×9): qty 1

## 2022-03-13 MED ORDER — REVEFENACIN 175 MCG/3ML IN SOLN
175.0000 ug | Freq: Every day | RESPIRATORY_TRACT | Status: DC
  Administered 2022-03-13 – 2022-03-22 (×10): 175 ug via RESPIRATORY_TRACT
  Filled 2022-03-13 (×11): qty 3

## 2022-03-13 MED ORDER — FREE WATER
200.0000 mL | Freq: Two times a day (BID) | Status: DC
  Administered 2022-03-13 – 2022-03-18 (×11): 200 mL

## 2022-03-13 MED ORDER — SODIUM CHLORIDE 3 % IN NEBU
4.0000 mL | INHALATION_SOLUTION | Freq: Two times a day (BID) | RESPIRATORY_TRACT | Status: AC
Start: 2022-03-13 — End: 2022-03-16
  Administered 2022-03-13 – 2022-03-15 (×3): 4 mL via RESPIRATORY_TRACT
  Filled 2022-03-13 (×3): qty 4

## 2022-03-13 MED ORDER — IPRATROPIUM-ALBUTEROL 0.5-2.5 (3) MG/3ML IN SOLN
3.0000 mL | Freq: Four times a day (QID) | RESPIRATORY_TRACT | Status: DC
  Administered 2022-03-13 – 2022-03-15 (×7): 3 mL via RESPIRATORY_TRACT
  Filled 2022-03-13 (×6): qty 3

## 2022-03-13 MED ORDER — THIAMINE HCL 100 MG PO TABS
100.0000 mg | ORAL_TABLET | Freq: Every day | ORAL | Status: DC
  Administered 2022-03-14 – 2022-03-22 (×9): 100 mg
  Filled 2022-03-13 (×9): qty 1

## 2022-03-13 NOTE — Progress Notes (Addendum)
STROKE TEAM PROGRESS NOTE  ? ?INTERVAL HISTORY ?Trial trach collar today. Still thick secretions. Nodding appropriately to questions and following commands.  ? ?Vitals:  ? 03/13/22 0600 03/13/22 0700 03/13/22 0755 03/13/22 0800  ?BP: (!) 101/58 (!) 102/58 (!) 102/58 133/65  ?Pulse: 70 65 67 69  ?Resp: 19 20 (!) 24 18  ?Temp:      ?TempSrc:      ?SpO2: 93% 95% 98% 98%  ?Weight:      ?Height:      ? ?CBC:  ?Recent Labs  ?Lab 03/11/22 ?0253 03/12/22 ?0559  ?WBC 9.4 10.5  ?HGB 13.7 13.5  ?HCT 42.7 40.9  ?MCV 93.6 92.1  ?PLT 177 200  ? ? ?Basic Metabolic Panel:  ?Recent Labs  ?Lab 03/10/22 ?0459 03/11/22 ?0253  ?NA 143 143  ?K 3.2* 3.7  ?CL 106 108  ?CO2 29 26  ?GLUCOSE 102* 87  ?BUN 16 14  ?CREATININE 0.78 0.73  ?CALCIUM 8.3* 8.4*  ?MG  --  2.3  ? ? ?Lipid Panel:  ?Recent Labs  ?Lab 03/12/22 ?0559  ?TRIG 69  ? ? ? ?IMAGING past 24 hours ?DG Abd 1 View ? ?Result Date: 03/04/2022 ?CLINICAL DATA:  Stroke Pre MRI screening EXAM: ABDOMEN - 1 VIEW COMPARISON:  None. FINDINGS: No dilated loops of bowel to indicate ileus or obstruction. Extensive atherosclerotic arterial calcifications are noted. Mild degenerative changes present lower lumbar spine. Enteric tube extensive left upper quadrant in the expected site of the stomach. No abnormal metallic densities are identified, however evaluation is limited due to multiple overlying external artifacts. IMPRESSION: No metallic density foreign body identified within the visualized abdomen. Electronically Signed   By: Miachel Roux M.D.   On: 03/04/2022 13:32  ? ?CT HEAD WO CONTRAST (5MM) ? ?Result Date: 03/05/2022 ?CLINICAL DATA:  Stroke follow-up EXAM: CT HEAD WITHOUT CONTRAST TECHNIQUE: Contiguous axial images were obtained from the base of the skull through the vertex without intravenous contrast. RADIATION DOSE REDUCTION: This exam was performed according to the departmental dose-optimization program which includes automated exposure control, adjustment of the mA and/or kV according  to patient size and/or use of iterative reconstruction technique. COMPARISON:  MRI brain 03/04/2022 FINDINGS: Brain: Subacute left MCA territory infarct with unchanged areas of cortical hypoattenuation. Encephalomalacia in the left PCA territory is unchanged. No acute hemorrhage or mass effect. Vascular: No abnormal hyperdensity of the major intracranial arteries or dural venous sinuses. No intracranial atherosclerosis. Skull: The visualized skull base, calvarium and extracranial soft tissues are normal. Sinuses/Orbits: Opacification of the maxillary and sphenoid sinuses. The orbits are normal. IMPRESSION: Subacute left MCA territory infarct without acute hemorrhage or mass effect. Electronically Signed   By: Ulyses Jarred M.D.   On: 03/05/2022 01:03  ? ?CT HEAD WO CONTRAST ? ?Result Date: 03/02/2022 ?CLINICAL DATA:  Seizure like activity, postictal, history of alcohol abuse EXAM: CT HEAD WITHOUT CONTRAST TECHNIQUE: Contiguous axial images were obtained from the base of the skull through the vertex without intravenous contrast. RADIATION DOSE REDUCTION: This exam was performed according to the departmental dose-optimization program which includes automated exposure control, adjustment of the mA and/or kV according to patient size and/or use of iterative reconstruction technique. COMPARISON:  08/20/2021 FINDINGS: Brain: Chronic left MCA territory infarct again identified, with interval dystrophic calcification in the infarct bed since prior study. Chronic ischemic changes are again identified throughout the periventricular and subcortical white matter, left basal ganglia, and left cerebellar hemisphere. No evidence of acute infarct or hemorrhage. Lateral ventricles and remaining  midline structures are unremarkable. No acute extra-axial fluid collections. No mass effect. Vascular: No hyperdense vessel or unexpected calcification. Skull: Normal. Negative for fracture or focal lesion. Sinuses/Orbits: Chronic  opacification right maxillary sinus. Stable mucosal thickening left maxillary sinus. Other: None. IMPRESSION: 1. Chronic ischemic changes as above. No acute intracranial process. Electronically Signed   By: Randa Ngo M.D.   On: 03/02/2022 19:57  ? ?CT CERVICAL SPINE WO CONTRAST ? ?Result Date: 03/02/2022 ?CLINICAL DATA:  Seizure like activity, history of alcohol abuse, neck trauma EXAM: CT CERVICAL SPINE WITHOUT CONTRAST TECHNIQUE: Multidetector CT imaging of the cervical spine was performed without intravenous contrast. Multiplanar CT image reconstructions were also generated. RADIATION DOSE REDUCTION: This exam was performed according to the departmental dose-optimization program which includes automated exposure control, adjustment of the mA and/or kV according to patient size and/or use of iterative reconstruction technique. COMPARISON:  None. FINDINGS: Alignment: Alignment is anatomic. Skull base and vertebrae: No acute fracture. No primary bone lesion or focal pathologic process. Soft tissues and spinal canal: No prevertebral fluid or swelling. No visible canal hematoma. Disc levels: Multilevel cervical spondylosis, with prominent anterior osteophyte formation from C4-5 through C6-7. Circumferential disc osteophyte complex at C3-4 and C6-7 results in right greater than left neural foraminal narrowing. Multilevel facet hypertrophy greatest from C4 through C6. Upper chest: Central airways patent.  Severe upper lobe emphysema. Other: Reconstructed images demonstrate no additional findings. IMPRESSION: 1. No acute cervical spine fracture. 2. Multilevel cervical spondylosis and facet hypertrophy as above. 3. Emphysema. Electronically Signed   By: Randa Ngo M.D.   On: 03/02/2022 20:00  ? ?MR ANGIO HEAD WO CONTRAST ? ?Addendum Date: 03/04/2022   ?ADDENDUM REPORT: 03/04/2022 17:39 ADDENDUM: Findings discussed with Dr. Leonel Ramsay via telephone at 5:35 p.m. Electronically Signed   By: Margaretha Sheffield M.D.    On: 03/04/2022 17:39  ? ?Result Date: 03/04/2022 ?CLINICAL DATA:  Neuro deficit, acute, stroke suspected; Stroke, follow up EXAM: MRI HEAD WITHOUT CONTRAST MRA HEAD WITHOUT CONTRAST TECHNIQUE: Multiplanar, multi-echo pulse sequences of the brain and surrounding structures were acquired without intravenous contrast. Angiographic images of the Circle of Willis were acquired using MRA technique without intravenous contrast. COMPARISON:  No pertinent prior exam. FINDINGS: MRI HEAD FINDINGS Brain: Acute left MCA territory infarcts predominantly involving the left frontal, parietal and temporal lobes. Infarcts involve both cortex and white matter. Small acute infarct involving the left insula. Associated edema and sulcal effacement without midline shift. Areas of susceptibility artifact and T2 hypointensity. Areas of T2 hypointensity, T1 hyperintensity and susceptibility artifact is concerning for acute hemorrhage although an element prior hemorrhage/cortical laminar necrosis is possible posteriorly. No hydrocephalus. Vascular: Major arterial flow voids are maintained at the skull base. Skull and upper cervical spine: Normal marrow signal. Sinuses/Orbits: Paranasal sinus mucosal thickening. No acute orbital findings. Other: Small left greater than right mastoid effusions. MRA HEAD FINDINGS Anterior circulation: The intracranial ICAs are now patent. Left ICAs attenuated, likely due to pre-existing atherosclerosis with probably moderate to severe paraclinoid ICA stenosis. Bilateral MCAs and ACAs are patent without proximal hemodynamically significant stenosis. Posterior circulation: Chronically occluded left intradural vertebral artery. Right intradural vertebral artery, basilar artery and posterior cerebral arteries are patent without proximal hemodynamically significant stenosis. Right fetal type PCA, anatomic variant. Anatomic variants: As detailed above. IMPRESSION: MRI: 1. Acute left MCA territory infarcts predominantly  involving the left frontal, parietal and temporal lobes. Associated edema and sulcal effacement without midline shift. 2. Findings concerning for associated acute hemorrhage in areas of  infarct. A noncontrast he

## 2022-03-13 NOTE — Progress Notes (Signed)
RT removed pt from CPAP/PS on ventilator and placed pt on T-BAR ATC (trach collar) 10L 40% with cuff still inflated. Pt tolerating well at this time. Pt remains to have copious amount of tan secretions. RN made aware of transition. RT will continue to monitor pt. ?

## 2022-03-13 NOTE — Progress Notes (Signed)
? ?NAME:  DAMOND BORCHERS, MRN:  [id number], DOB:  March 30, 1961, LOS: 43 ?ADMISSION DATE:  03/02/2022, CONSULTATION DATE:  3/16 ?REFERRING MD:  Erlinda Hong, CHIEF COMPLAINT:  Confusion, seizure  ? ?History of Present Illness:  ?61 y/o male with a history of Left MCA stroke in 9767 with a complicated post stroke course presented on 3/15 with a 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  Intubated on 3/16 for airway protection. ? ?Pertinent  Medical History  ?Hyeprtension ?Left MCA stroke ?Chronic combined CHF ? ?Significant Hospital Events: ?Including procedures, antibiotic start and stop dates in addition to other pertinent events   ?3/15 admitted for acute L MCA stroke due to L ICA occlusion with first time seizure s/p revascularization of L ICA occlusion ?3/16 Intubated for airway protection; started unasyn; echo LVEF 40-45%, RVSP normal ?3/17 MRI Brain showed acute infarct in left frontal, temporal and parietal lobes with findings worrisome for acute infarct, left vert occluded; CT head did not show hemorrhage; respiratory notes thick secretions,  ?3/18 following some commands on exam, extubated the re-intubated due to hypoxemia, inability to clear secretions ?3/19 self extubated overnight, remains on NRB ?3/21 required reintubation for inability to protect airway and aspiration  ?3/22 bedside perc tracheostomy with bronchoscopy  ?3/24 PEG ? ?Interim History / Subjective:  ? ?On ATC today ?Off mechanical ventilation today ?Has thick secretions ? ?Objective   ?Blood pressure (!) 102/58, pulse 65, temperature 99.1 ?F (37.3 ?C), temperature source Oral, resp. rate 20, height 6' (1.829 m), weight 80.1 kg, SpO2 95 %. ?   ?Vent Mode: PRVC ?FiO2 (%):  [40 %] 40 % ?Set Rate:  [16 bmp] 16 bmp ?Vt Set:  [620 mL] 620 mL ?PEEP:  [5 cmH20] 5 cmH20 ?Pressure Support:  [8 cmH20] 8 cmH20 ?Plateau Pressure:  [10 cmH20-13 cmH20] 13 cmH20  ? ?Intake/Output Summary (Last 24 hours) at 03/13/2022 0751 ?Last data filed at  03/13/2022 0700 ?Gross per 24 hour  ?Intake 1550.01 ml  ?Output 1325 ml  ?Net 225.01 ml  ? ?Filed Weights  ? 03/11/22 0500 03/12/22 0247 03/13/22 0500  ?Weight: 77.8 kg 80.6 kg 80.1 kg  ? ? ?Examination: ? ?General:  Resting comfortably in bed ?HENT: NCAT OP clear tracheostomy in place ?PULM: Distant heart sounds, normal effort ?CV: RRR, no mgr ?GI: BS+, soft, nontender ?MSK: normal bulk and tone ?Neuro: awake, alert, moves left arm/leg ? ? ? ?Resolved Hospital Problem list   ?Septic shock due to aspiration pneumonia> resolved ? ?Assessment & Plan:  ?Acute encephalopathy due to left MCA stroke from left ICA occlusion requiring thrombectomy and stent placement on 3/15 ?Seizure disorder ?Unclear if baseline etoh use ?Agitated delirium on 3/19> risk to self pulling at tubes, lines ?Keppra ?Continue to monitor neuro exam ?Continue to monitor neuro exam ?Secondary stroke prevention per neurology/stroke service ?BP goals and glucose goals per stroke service ?Statin, antiplatelet agent per stroke service ?Imaging per stroke service ?PT/OT/SLP ? ?Acute hypoxemic respiratory failure due to aspiration pneumonia s/p tracheostomy > improving ?Thick pulmonary secretions ?Concern for emphysema/COPD > not in history but exam supports ?Change from vent to tracheostomy collar today> go as long as he can tolerate, may need vent again tonight ?Trach care per routine ?Continue supplemental oxygen ?Send trach culture ?Start COPD treatment> presumably he has this based on exam; Brovana/pulmicort/Yupelri ?Guaifenesin, hypertonic saline ? ?Combined systolic and diastolic heart failure, chronic ?Hypertension ?Monitor hemodynamics ?Hydralazine prn SBP > 160 ? ?RUE thrombophlebitis ?Warm compresses, monitor ? ? ?Best Practice (  right click and "Reselect all SmartList Selections" daily)  ? ?Diet/type: tubefeeds ?DVT prophylaxis: LMWH ?GI prophylaxis: PPI ?Lines: N/A ?Foley:  N/A ?Code Status:  full code ?Last date of multidisciplinary goals of  care discussion [per stroke service] ? ?Labs   ?CBC: ?Recent Labs  ?Lab 03/08/22 ?0605 03/08/22 ?1732 03/09/22 ?0301 03/10/22 ?0459 03/11/22 ?1093 03/12/22 ?0559  ?WBC 7.3  --  9.3 8.8 9.4 10.5  ?HGB 15.3 16.0 14.6 14.1 13.7 13.5  ?HCT 45.6 47.0 43.2 42.9 42.7 40.9  ?MCV 89.9  --  90.8 92.5 93.6 92.1  ?PLT 149*  --  185 191 177 200  ? ? ?Basic Metabolic Panel: ?Recent Labs  ?Lab 03/07/22 ?2355 03/08/22 ?7322 03/08/22 ?1732 03/09/22 ?0301 03/10/22 ?0254 03/11/22 ?2706  ?NA 139 140 139 138 143 143  ?K 4.1 3.2* 3.3* 3.0* 3.2* 3.7  ?CL 103 101  --  98 106 108  ?CO2 28 30  --  27 29 26   ?GLUCOSE 126* 127*  --  107* 102* 87  ?BUN 9 9  --  20 16 14   ?CREATININE 0.66 0.68  --  0.84 0.78 0.73  ?CALCIUM 8.7* 8.6*  --  8.4* 8.3* 8.4*  ?MG  --   --   --   --   --  2.3  ? ?GFR: ?Estimated Creatinine Clearance: 106.4 mL/min (by C-G formula based on SCr of 0.73 mg/dL). ?Recent Labs  ?Lab 03/09/22 ?0301 03/10/22 ?0459 03/11/22 ?0253 03/12/22 ?0559  ?WBC 9.3 8.8 9.4 10.5  ? ? ?Liver Function Tests: ?Recent Labs  ?Lab 03/10/22 ?2376  ?AST 16  ?ALT 14  ?ALKPHOS 47  ?BILITOT 1.2  ?PROT 6.0*  ?ALBUMIN 2.5*  ? ?No results for input(s): LIPASE, AMYLASE in the last 168 hours. ?No results for input(s): AMMONIA in the last 168 hours. ? ?ABG ?   ?Component Value Date/Time  ? PHART 7.378 03/08/2022 1732  ? PCO2ART 55.9 (H) 03/08/2022 1732  ? PO2ART 98 03/08/2022 1732  ? HCO3 32.8 (H) 03/08/2022 1732  ? TCO2 34 (H) 03/08/2022 1732  ? O2SAT 97 03/08/2022 1732  ?  ? ?Coagulation Profile: ?No results for input(s): INR, PROTIME in the last 168 hours. ? ?Cardiac Enzymes: ?No results for input(s): CKTOTAL, CKMB, CKMBINDEX, TROPONINI in the last 168 hours. ? ?HbA1C: ?Hgb A1c MFr Bld  ?Date/Time Value Ref Range Status  ?03/03/2022 04:30 AM 5.4 4.8 - 5.6 % Final  ?  Comment:  ?  (NOTE) ?        Prediabetes: 5.7 - 6.4 ?        Diabetes: >6.4 ?        Glycemic control for adults with diabetes: <7.0 ?  ?08/19/2021 07:49 AM 5.5 4.8 - 5.6 % Final  ?   Comment:  ?  (NOTE) ?Pre diabetes:          5.7%-6.4% ? ?Diabetes:              >6.4% ? ?Glycemic control for   <7.0% ?adults with diabetes ?  ? ? ?CBG: ?Recent Labs  ?Lab 03/12/22 ?1517 03/12/22 ?1936 03/13/22 ?0011 03/13/22 ?2831 03/13/22 ?0736  ?GLUCAP 145* 129* 143* 154* 131*  ? ? ?  ? ?Critical care time: 31 minutes ?  ? ?Roselie Awkward, MD ?[facility] ?Pager: [phone] ?Cell: [phone] ?After 7:00 pm call Elink  [phone] ? ? ? ? ? ? ?

## 2022-03-14 ENCOUNTER — Encounter (HOSPITAL_COMMUNITY): Payer: Self-pay | Admitting: General Surgery

## 2022-03-14 DIAGNOSIS — I6522 Occlusion and stenosis of left carotid artery: Secondary | ICD-10-CM | POA: Diagnosis not present

## 2022-03-14 DIAGNOSIS — I63512 Cerebral infarction due to unspecified occlusion or stenosis of left middle cerebral artery: Secondary | ICD-10-CM | POA: Diagnosis not present

## 2022-03-14 LAB — BASIC METABOLIC PANEL
Anion gap: 4 — ABNORMAL LOW (ref 5–15)
BUN: 11 mg/dL (ref 8–23)
CO2: 29 mmol/L (ref 22–32)
Calcium: 8.2 mg/dL — ABNORMAL LOW (ref 8.9–10.3)
Chloride: 105 mmol/L (ref 98–111)
Creatinine, Ser: 0.62 mg/dL (ref 0.61–1.24)
GFR, Estimated: >60 mL/min (ref >60)
Glucose, Bld: 122 mg/dL — ABNORMAL HIGH (ref 70–99)
Potassium: 3.7 mmol/L (ref 3.5–5.1)
Sodium: 138 mmol/L (ref 135–145)

## 2022-03-14 LAB — CBC
HCT: 41.8 % (ref 39.0–52.0)
Hemoglobin: 13.5 g/dL (ref 13.0–17.0)
MCH: 29.7 pg (ref 26.0–34.0)
MCHC: 32.3 g/dL (ref 30.0–36.0)
MCV: 92.1 fL (ref 80.0–100.0)
Platelets: 261 10*3/uL (ref 150–400)
RBC: 4.54 MIL/uL (ref 4.22–5.81)
RDW: 12.7 % (ref 11.5–15.5)
WBC: 12.7 10*3/uL — ABNORMAL HIGH (ref 4.0–10.5)
nRBC: 0 % (ref 0.0–0.2)

## 2022-03-14 LAB — GLUCOSE, CAPILLARY
Glucose-Capillary: 127 mg/dL — ABNORMAL HIGH (ref 70–99)
Glucose-Capillary: 132 mg/dL — ABNORMAL HIGH (ref 70–99)
Glucose-Capillary: 140 mg/dL — ABNORMAL HIGH (ref 70–99)
Glucose-Capillary: 106 mg/dL — ABNORMAL HIGH (ref 70–99)
Glucose-Capillary: 130 mg/dL — ABNORMAL HIGH (ref 70–99)
Glucose-Capillary: 135 mg/dL — ABNORMAL HIGH (ref 70–99)

## 2022-03-14 MED ORDER — POLYETHYLENE GLYCOL 3350 17 G PO PACK: 17.0000 g | PACK | Freq: Every day | ORAL | Status: DC | PRN

## 2022-03-14 MED ORDER — POLYETHYLENE GLYCOL 3350 17 G PO PACK
17.0000 g | PACK | Freq: Every day | ORAL | Status: DC
  Administered 2022-03-14: 17 g
  Filled 2022-03-14: qty 1

## 2022-03-14 MED ORDER — DOCUSATE SODIUM 50 MG/5ML PO LIQD
100.0000 mg | Freq: Two times a day (BID) | ORAL | Status: DC
  Administered 2022-03-14 – 2022-03-21 (×11): 100 mg
  Filled 2022-03-14 (×14): qty 10

## 2022-03-14 MED ORDER — ORAL CARE MOUTH RINSE
15.0000 mL | Freq: Four times a day (QID) | OROMUCOSAL | Status: DC
  Administered 2022-03-15 – 2022-03-22 (×31): 15 mL via OROMUCOSAL

## 2022-03-14 MED ORDER — GLYCOPYRROLATE 1 MG PO TABS
1.0000 mg | ORAL_TABLET | Freq: Two times a day (BID) | ORAL | Status: DC
  Filled 2022-03-14: qty 1

## 2022-03-14 MED ORDER — POTASSIUM CHLORIDE 20 MEQ PO PACK
40.0000 meq | PACK | Freq: Once | ORAL | Status: AC
  Administered 2022-03-14: 40 meq
  Filled 2022-03-14: qty 2

## 2022-03-14 MED ORDER — LEVETIRACETAM 100 MG/ML PO SOLN
500.0000 mg | Freq: Two times a day (BID) | ORAL | Status: DC
  Administered 2022-03-14 – 2022-03-22 (×17): 500 mg
  Filled 2022-03-14 (×17): qty 5

## 2022-03-14 MED ORDER — GUAIFENESIN 100 MG/5ML PO LIQD
15.0000 mL | Freq: Four times a day (QID) | ORAL | Status: DC
  Administered 2022-03-14 – 2022-03-22 (×32): 15 mL
  Filled 2022-03-14 (×32): qty 15

## 2022-03-14 NOTE — Progress Notes (Addendum)
? ?NAME:  Todd Estrada, MRN:  [id number], DOB:  Jan 18, 1961, LOS: 34 ?ADMISSION DATE:  03/02/2022, CONSULTATION DATE:  3/16 ?REFERRING MD:  Erlinda Hong, CHIEF COMPLAINT:  Confusion, seizure  ? ?History of Present Illness:  ?61 y/o male with a history of Left MCA stroke in 3536 with a complicated post stroke course presented on 3/15 with a 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  Intubated on 3/16 for airway protection. ? ?Pertinent  Medical History  ?Hyeprtension ?Left MCA stroke ?Chronic combined CHF ? ?Significant Hospital Events: ?Including procedures, antibiotic start and stop dates in addition to other pertinent events   ?3/15 admitted for acute L MCA stroke due to L ICA occlusion with first time seizure s/p revascularization of L ICA occlusion ?3/16 Intubated for airway protection; started unasyn; echo LVEF 40-45%, RVSP normal ?3/17 MRI Brain showed acute infarct in left frontal, temporal and parietal lobes with findings worrisome for acute infarct, left vert occluded; CT head did not show hemorrhage; respiratory notes thick secretions,  ?3/18 following some commands on exam, extubated the re-intubated due to hypoxemia, inability to clear secretions ?3/19 self extubated overnight, remains on NRB ?3/21 required reintubation for inability to protect airway and aspiration  ?3/22 bedside perc tracheostomy with bronchoscopy  ?3/24 PEG ?3/26 started on trach collar ? ?Interim History / Subjective:  ?Patient is continued on trach collar today. He is more awake and intermittently following commands. ?Thick secretions  ? ?Objective   ?Blood pressure 135/73, pulse 74, temperature 98.5 ?F (36.9 ?C), temperature source Axillary, resp. rate 18, height 6' (1.829 m), weight 79.6 kg, SpO2 96 %. ?   ?Vent Mode: PSV;CPAP ?FiO2 (%):  [40 %] 40 % ?Pressure Support:  [8 cmH20] 8 cmH20  ? ?Intake/Output Summary (Last 24 hours) at 03/14/2022 0713 ?Last data filed at 03/14/2022 0630 ?Gross per 24 hour  ?Intake 2034.98  ml  ?Output 2030 ml  ?Net 4.98 ml  ? ?Filed Weights  ? 03/12/22 0247 03/13/22 0500 03/14/22 0500  ?Weight: 80.6 kg 80.1 kg 79.6 kg  ? ? ?Examination: ? ?General:  Resting comfortably in bed ?HENT: NCAT OP clear tracheostomy in place with thick secretions  ?PULM: diffuse crackles; no wheezing. normal effort ?CV: RRR, no mgr ?GI: BS+, soft, nontender ?MSK: normal bulk and tone ?Neuro: awake, alert, moves left arm/leg; intermittently follows commands; left gaze preference  ? ?Resolved Hospital Problem list   ?Septic shock due to aspiration pneumonia> resolved ? ?Assessment & Plan:  ?Acute encephalopathy due to left MCA stroke from left ICA occlusion requiring thrombectomy and stent placement on 3/15 ?Seizure disorder ?Agitated delirium on 3/19> risk to self pulling at tubes, lines ?Continue Keppra  ?Continue to monitor neuro exam ?Secondary stroke prevention per neurology/stroke service ?BP goals and glucose goals per stroke service ?Continue statin and dual antiplatelet agent per stroke service ?Imaging per stroke service ?PT/OT/SLP ? ?Acute hypoxemic respiratory failure due to aspiration pneumonia s/p tracheostomy > improving ?Thick pulmonary secretions ?Concern for emphysema/COPD > not in history but exam supports ?Continues to tolerate trach collar today. If continues to tolerate through tomorrow, can transfer out of ICU ?- Trach care per routine ?- Chest PT for thick secretions ?- Continue supplemental oxygen ?- Continue Brovana, pulmicort, yupelri  ?- F/u trach culture  ? ?Combined systolic and diastolic heart failure, chronic ?Hypertension ?Monitor hemodynamics ?Hydralazine prn SBP > 160 ? ?RUE thrombophlebitis ?Warm compresses, monitor ? ?Constipation ?- Bowel regimen ? ?Best Practice (right click and "Reselect all SmartList Selections" daily)  ? ?  Diet/type: tubefeeds ?DVT prophylaxis: LMWH ?GI prophylaxis: PPI ?Lines: N/A ?Foley:  N/A ?Code Status:  full code ?Last date of multidisciplinary goals of care  discussion [per stroke service] ? ?Labs   ?CBC: ?Recent Labs  ?Lab 03/10/22 ?0459 03/11/22 ?0253 03/12/22 ?0559 03/13/22 ?1062 03/14/22 ?0259  ?WBC 8.8 9.4 10.5 11.3* 12.7*  ?HGB 14.1 13.7 13.5 12.7* 13.5  ?HCT 42.9 42.7 40.9 39.3 41.8  ?MCV 92.5 93.6 92.1 92.7 92.1  ?PLT 191 177 200 222 261  ? ? ?Basic Metabolic Panel: ?Recent Labs  ?Lab 03/09/22 ?0301 03/10/22 ?6948 03/11/22 ?0253 03/13/22 ?5462 03/14/22 ?0259  ?NA 138 143 143 139 138  ?K 3.0* 3.2* 3.7 3.5 3.7  ?CL 98 106 108 103 105  ?CO2 27 29 26 30 29   ?GLUCOSE 107* 102* 87 127* 122*  ?BUN 20 16 14 12 11   ?CREATININE 0.84 0.78 0.73 0.57* 0.62  ?CALCIUM 8.4* 8.3* 8.4* 8.1* 8.2*  ?MG  --   --  2.3  --   --   ? ?GFR: ?Estimated Creatinine Clearance: 106.4 mL/min (by C-G formula based on SCr of 0.62 mg/dL). ?Recent Labs  ?Lab 03/11/22 ?0253 03/12/22 ?0559 03/13/22 ?7035 03/14/22 ?0259  ?WBC 9.4 10.5 11.3* 12.7*  ? ? ?Liver Function Tests: ?Recent Labs  ?Lab 03/10/22 ?0093 03/13/22 ?0709  ?AST 16 26  ?ALT 14 24  ?ALKPHOS 47 81  ?BILITOT 1.2 0.6  ?PROT 6.0* 5.5*  ?ALBUMIN 2.5* 2.1*  ? ?No results for input(s): LIPASE, AMYLASE in the last 168 hours. ?No results for input(s): AMMONIA in the last 168 hours. ? ?ABG ?   ?Component Value Date/Time  ? PHART 7.378 03/08/2022 1732  ? PCO2ART 55.9 (H) 03/08/2022 1732  ? PO2ART 98 03/08/2022 1732  ? HCO3 32.8 (H) 03/08/2022 1732  ? TCO2 34 (H) 03/08/2022 1732  ? O2SAT 97 03/08/2022 1732  ?  ? ?Coagulation Profile: ?No results for input(s): INR, PROTIME in the last 168 hours. ? ?Cardiac Enzymes: ?No results for input(s): CKTOTAL, CKMB, CKMBINDEX, TROPONINI in the last 168 hours. ? ?HbA1C: ?Hgb A1c MFr Bld  ?Date/Time Value Ref Range Status  ?03/03/2022 04:30 AM 5.4 4.8 - 5.6 % Final  ?  Comment:  ?  (NOTE) ?        Prediabetes: 5.7 - 6.4 ?        Diabetes: >6.4 ?        Glycemic control for adults with diabetes: <7.0 ?  ?08/19/2021 07:49 AM 5.5 4.8 - 5.6 % Final  ?  Comment:  ?  (NOTE) ?Pre diabetes:           5.7%-6.4% ? ?Diabetes:              >6.4% ? ?Glycemic control for   <7.0% ?adults with diabetes ?  ? ? ?CBG: ?Recent Labs  ?Lab 03/13/22 ?1118 03/13/22 ?1539 03/13/22 ?1945 03/13/22 ?2310 03/14/22 ?0321  ?GLUCAP 128* 121* 127* 148* 127*  ? ?Critical care time:  ?  ? ? ? ? ? ? ? ?

## 2022-03-14 NOTE — Progress Notes (Addendum)
STROKE TEAM PROGRESS NOTE  ? ?INTERVAL HISTORY ?Patient is seen in his room with no family at the bedside.  He has been hemodynamically stable overnight and has had no acute events.  His neurological exam is somewhat improved today, but remains aphasic with dense right hemiplegia but follows occasional midline and a few one-step commands and he has been on trach collar since this morning for trial of weaning of ventilatory support.. ? ?Vitals:  ? 03/14/22 1100 03/14/22 1122 03/14/22 1156 03/14/22 1200  ?BP: 120/66 120/66  117/67  ?Pulse: 70 77 74 75  ?Resp: (!) 23 (!) 24 (!) 22 (!) 24  ?Temp:   97.9 ?F (36.6 ?C)   ?TempSrc:   Axillary   ?SpO2: 96% 96% 90% (!) 89%  ?Weight:      ?Height:      ? ?CBC:  ?Recent Labs  ?Lab 03/13/22 ?0709 03/14/22 ?5093  ?WBC 11.3* 12.7*  ?HGB 12.7* 13.5  ?HCT 39.3 41.8  ?MCV 92.7 92.1  ?PLT 222 261  ? ? ?Basic Metabolic Panel:  ?Recent Labs  ?Lab 03/11/22 ?0253 03/13/22 ?0709 03/14/22 ?0259  ?NA 143 139 138  ?K 3.7 3.5 3.7  ?CL 108 103 105  ?CO2 26 30 29   ?GLUCOSE 87 127* 122*  ?BUN 14 12 11   ?CREATININE 0.73 0.57* 0.62  ?CALCIUM 8.4* 8.1* 8.2*  ?MG 2.3  --   --   ? ? ?Lipid Panel:  ?Recent Labs  ?Lab 03/12/22 ?0559  ?TRIG 69  ? ? ? ?IMAGING past 24 hours ?DG Abd 1 View ? ?Result Date: 03/04/2022 ?CLINICAL DATA:  Stroke Pre MRI screening EXAM: ABDOMEN - 1 VIEW COMPARISON:  None. FINDINGS: No dilated loops of bowel to indicate ileus or obstruction. Extensive atherosclerotic arterial calcifications are noted. Mild degenerative changes present lower lumbar spine. Enteric tube extensive left upper quadrant in the expected site of the stomach. No abnormal metallic densities are identified, however evaluation is limited due to multiple overlying external artifacts. IMPRESSION: No metallic density foreign body identified within the visualized abdomen. Electronically Signed   By: Miachel Roux M.D.   On: 03/04/2022 13:32  ? ?CT HEAD WO CONTRAST (5MM) ? ?Result Date: 03/05/2022 ?CLINICAL DATA:   Stroke follow-up EXAM: CT HEAD WITHOUT CONTRAST TECHNIQUE: Contiguous axial images were obtained from the base of the skull through the vertex without intravenous contrast. RADIATION DOSE REDUCTION: This exam was performed according to the departmental dose-optimization program which includes automated exposure control, adjustment of the mA and/or kV according to patient size and/or use of iterative reconstruction technique. COMPARISON:  MRI brain 03/04/2022 FINDINGS: Brain: Subacute left MCA territory infarct with unchanged areas of cortical hypoattenuation. Encephalomalacia in the left PCA territory is unchanged. No acute hemorrhage or mass effect. Vascular: No abnormal hyperdensity of the major intracranial arteries or dural venous sinuses. No intracranial atherosclerosis. Skull: The visualized skull base, calvarium and extracranial soft tissues are normal. Sinuses/Orbits: Opacification of the maxillary and sphenoid sinuses. The orbits are normal. IMPRESSION: Subacute left MCA territory infarct without acute hemorrhage or mass effect. Electronically Signed   By: Ulyses Jarred M.D.   On: 03/05/2022 01:03  ? ?CT HEAD WO CONTRAST ? ?Result Date: 03/02/2022 ?CLINICAL DATA:  Seizure like activity, postictal, history of alcohol abuse EXAM: CT HEAD WITHOUT CONTRAST TECHNIQUE: Contiguous axial images were obtained from the base of the skull through the vertex without intravenous contrast. RADIATION DOSE REDUCTION: This exam was performed according to the departmental dose-optimization program which includes automated exposure control, adjustment of  the mA and/or kV according to patient size and/or use of iterative reconstruction technique. COMPARISON:  08/20/2021 FINDINGS: Brain: Chronic left MCA territory infarct again identified, with interval dystrophic calcification in the infarct bed since prior study. Chronic ischemic changes are again identified throughout the periventricular and subcortical white matter, left  basal ganglia, and left cerebellar hemisphere. No evidence of acute infarct or hemorrhage. Lateral ventricles and remaining midline structures are unremarkable. No acute extra-axial fluid collections. No mass effect. Vascular: No hyperdense vessel or unexpected calcification. Skull: Normal. Negative for fracture or focal lesion. Sinuses/Orbits: Chronic opacification right maxillary sinus. Stable mucosal thickening left maxillary sinus. Other: None. IMPRESSION: 1. Chronic ischemic changes as above. No acute intracranial process. Electronically Signed   By: Randa Ngo M.D.   On: 03/02/2022 19:57  ? ?CT CERVICAL SPINE WO CONTRAST ? ?Result Date: 03/02/2022 ?CLINICAL DATA:  Seizure like activity, history of alcohol abuse, neck trauma EXAM: CT CERVICAL SPINE WITHOUT CONTRAST TECHNIQUE: Multidetector CT imaging of the cervical spine was performed without intravenous contrast. Multiplanar CT image reconstructions were also generated. RADIATION DOSE REDUCTION: This exam was performed according to the departmental dose-optimization program which includes automated exposure control, adjustment of the mA and/or kV according to patient size and/or use of iterative reconstruction technique. COMPARISON:  None. FINDINGS: Alignment: Alignment is anatomic. Skull base and vertebrae: No acute fracture. No primary bone lesion or focal pathologic process. Soft tissues and spinal canal: No prevertebral fluid or swelling. No visible canal hematoma. Disc levels: Multilevel cervical spondylosis, with prominent anterior osteophyte formation from C4-5 through C6-7. Circumferential disc osteophyte complex at C3-4 and C6-7 results in right greater than left neural foraminal narrowing. Multilevel facet hypertrophy greatest from C4 through C6. Upper chest: Central airways patent.  Severe upper lobe emphysema. Other: Reconstructed images demonstrate no additional findings. IMPRESSION: 1. No acute cervical spine fracture. 2. Multilevel cervical  spondylosis and facet hypertrophy as above. 3. Emphysema. Electronically Signed   By: Randa Ngo M.D.   On: 03/02/2022 20:00  ? ?MR ANGIO HEAD WO CONTRAST ? ?Addendum Date: 03/04/2022   ?ADDENDUM REPORT: 03/04/2022 17:39 ADDENDUM: Findings discussed with Dr. Leonel Ramsay via telephone at 5:35 p.m. Electronically Signed   By: Margaretha Sheffield M.D.   On: 03/04/2022 17:39  ? ?Result Date: 03/04/2022 ?CLINICAL DATA:  Neuro deficit, acute, stroke suspected; Stroke, follow up EXAM: MRI HEAD WITHOUT CONTRAST MRA HEAD WITHOUT CONTRAST TECHNIQUE: Multiplanar, multi-echo pulse sequences of the brain and surrounding structures were acquired without intravenous contrast. Angiographic images of the Circle of Willis were acquired using MRA technique without intravenous contrast. COMPARISON:  No pertinent prior exam. FINDINGS: MRI HEAD FINDINGS Brain: Acute left MCA territory infarcts predominantly involving the left frontal, parietal and temporal lobes. Infarcts involve both cortex and white matter. Small acute infarct involving the left insula. Associated edema and sulcal effacement without midline shift. Areas of susceptibility artifact and T2 hypointensity. Areas of T2 hypointensity, T1 hyperintensity and susceptibility artifact is concerning for acute hemorrhage although an element prior hemorrhage/cortical laminar necrosis is possible posteriorly. No hydrocephalus. Vascular: Major arterial flow voids are maintained at the skull base. Skull and upper cervical spine: Normal marrow signal. Sinuses/Orbits: Paranasal sinus mucosal thickening. No acute orbital findings. Other: Small left greater than right mastoid effusions. MRA HEAD FINDINGS Anterior circulation: The intracranial ICAs are now patent. Left ICAs attenuated, likely due to pre-existing atherosclerosis with probably moderate to severe paraclinoid ICA stenosis. Bilateral MCAs and ACAs are patent without proximal hemodynamically significant stenosis. Posterior  circulation: Chronically  occluded left intradural vertebral artery. Right intradural vertebral artery, basilar artery and posterior cerebral arteries are patent without proximal hemodynamically significant stenosis

## 2022-03-14 NOTE — Progress Notes (Signed)
Physical Therapy Treatment ?Patient Details ?Name: Todd Estrada ?MRN: [id number] ?DOB: [birth year] ?Today's Date: 03/14/2022 ? ? ?History of Present Illness 61 y/o male with a history of Left MCA stroke in 0626 with a complicated post stroke course presented on 3/15 with a 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  He was extubated following procedure, but reintubated due to respiratory failure.  Self extubated overnight 3/19, now on venturi mask.  Re-intubated on 3/21 and bronchoscopy and tracheostomy performed on 3/22. ? ?  ?PT Comments  ? ? Patient progressing able to follow commands better this session for mobility and more automatic with supine to sit as well as for sit to stand though needing A due to R side inattention and poor placement of R LE.  On 40% trach collar tolerating well with O2 sats, but still with lot of secretions.  Continue to recommend acute inpatient rehab.  Would need capable caregiver at d/c.  PT will continue to follow acutely.   ?Recommendations for follow up therapy are one component of a multi-disciplinary discharge planning process, led by the attending physician.  Recommendations may be updated based on patient status, additional functional criteria and insurance authorization. ? ?Follow Up Recommendations ? Acute inpatient rehab (3hours/day) ?  ?  ?Assistance Recommended at Discharge Frequent or constant Supervision/Assistance  ?Patient can return home with the following A lot of help with bathing/dressing/bathroom;Direct supervision/assist for medications management;Assist for transportation;Help with stairs or ramp for entrance;Two people to help with walking and/or transfers ?  ?Equipment Recommendations ? Wheelchair (measurements PT);Wheelchair cushion (measurements PT);Hospital bed  ?  ?Recommendations for Other Services   ? ? ?  ?Precautions / Restrictions Precautions ?Precautions: Fall ?Precaution Comments: mitts, posey, wrist restraint on L, R  inattention  ?  ? ?Mobility ? Bed Mobility ?Overal bed mobility: Needs Assistance ?Bed Mobility: Supine to Sit, Sit to Supine ?  ?  ?Supine to sit: Max assist, HOB elevated ?Sit to supine: Min assist ?  ?General bed mobility comments: assist for R LE to EOB and R UE ?  ? ?Transfers ?Overall transfer level: Needs assistance ?Equipment used: 2 person hand held assist ?Transfers: Sit to/from Stand ?Sit to Stand: Mod assist, +2 safety/equipment ?  ?  ?  ?  ?  ?General transfer comment: assist for R foot placement, R knee control in standing; performed x 5 reps ?  ? ?Ambulation/Gait ?  ?  ?  ?  ?  ?  ?  ?General Gait Details: attempted side steps with mod cues, but not able to take steps ? ? ?Stairs ?  ?  ?  ?  ?  ? ? ?Wheelchair Mobility ?  ? ?Modified Rankin (Stroke Patients Only) ?Modified Rankin (Stroke Patients Only) ?Pre-Morbid Rankin Score: Slight disability ?Modified Rankin: Severe disability ? ? ?  ?Balance Overall balance assessment: Needs assistance ?Sitting-balance support: Feet supported ?Sitting balance-Leahy Scale: Poor ?Sitting balance - Comments: not pushing, but minguard for safety seated EOB ?  ?Standing balance support: Bilateral upper extremity supported ?Standing balance-Leahy Scale: Poor ?Standing balance comment: assist for standing balance ?  ?  ?  ?  ?  ?  ?  ?  ?  ?  ?  ?  ? ?  ?Cognition Arousal/Alertness: Awake/alert ?Behavior During Therapy: Impulsive ?Overall Cognitive Status: Difficult to assess ?  ?  ?  ?  ?  ?  ?  ?  ?  ?  ?  ?  ?Following Commands: Follows one step  commands consistently, Follows one step commands with increased time ?Safety/Judgement: Decreased awareness of deficits, Decreased awareness of safety ?  ?Problem Solving: Slow processing ?General Comments: inconsistently following commands ?  ?  ? ?  ?Exercises   ? ?  ?General Comments General comments (skin integrity, edema, etc.): on trach collar 40% w/ t-bar for suctioning secretions/aeresol treatments ?  ?  ? ?Pertinent  Vitals/Pain Pain Assessment ?Facial Expression: Relaxed, neutral ?Body Movements: Absence of movements ?Muscle Tension: Relaxed ?Compliance with ventilator (intubated pts.): N/A ?Vocalization (extubated pts.): Talking in normal tone or no sound ?CPOT Total: 0  ? ? ?Home Living   ?  ?  ?  ?  ?  ?  ?  ?  ?  ?   ?  ?Prior Function    ?  ?  ?   ? ?PT Goals (current goals can now be found in the care plan section) Progress towards PT goals: Progressing toward goals ? ?  ?Frequency ? ? ? Min 4X/week ? ? ? ?  ?PT Plan Current plan remains appropriate  ? ? ?Co-evaluation   ?  ?  ?  ?  ? ?  ?AM-PAC PT "6 Clicks" Mobility   ?Outcome Measure ? Help needed turning from your back to your side while in a flat bed without using bedrails?: A Little ?Help needed moving from lying on your back to sitting on the side of a flat bed without using bedrails?: A Little ?Help needed moving to and from a bed to a chair (including a wheelchair)?: A Lot ?Help needed standing up from a chair using your arms (e.g., wheelchair or bedside chair)?: A Lot ?Help needed to walk in hospital room?: Total ?Help needed climbing 3-5 steps with a railing? : Total ?6 Click Score: 12 ? ?  ?End of Session Equipment Utilized During Treatment: Oxygen ?Activity Tolerance: Patient tolerated treatment well ?Patient left: in bed;with call bell/phone within reach;with bed alarm set;with restraints reapplied ?Nurse Communication: Mobility status ?PT Visit Diagnosis: Other abnormalities of gait and mobility (R26.89);Other symptoms and signs involving the nervous system (R29.898);Hemiplegia and hemiparesis ?Hemiplegia - Right/Left: Right ?Hemiplegia - dominant/non-dominant: Dominant ?Hemiplegia - caused by: Cerebral infarction ?  ? ? ?Time: 7867-6720 ?PT Time Calculation (min) (ACUTE ONLY): 25 min ? ?Charges:  $Therapeutic Activity: 23-37 mins          ?          ? ?Magda Kiel, PT ?Acute Rehabilitation  Services ?NOBSJ:[phone] ?Office:[phone] ?03/14/2022 ? ? ? ?Reginia Naas ?03/14/2022, 2:51 PM ? ?

## 2022-03-14 NOTE — Progress Notes (Signed)
Inpatient Rehab Admissions Coordinator:  ? ?At this time we are recommending CIR consult and I will place an order per our protocol.  Note still with copious secretions and on 40% trach collar.  We are able to take pt's on 35% or less trach collar.  ? ?Shann Medal, PT, DPT ?Admissions Coordinator ?[phone] ?03/14/22  ?4:22 PM ? ?

## 2022-03-15 DIAGNOSIS — I63512 Cerebral infarction due to unspecified occlusion or stenosis of left middle cerebral artery: Secondary | ICD-10-CM | POA: Diagnosis not present

## 2022-03-15 DIAGNOSIS — I6522 Occlusion and stenosis of left carotid artery: Secondary | ICD-10-CM | POA: Diagnosis not present

## 2022-03-15 LAB — CULTURE, RESPIRATORY W GRAM STAIN
Culture: NORMAL
Gram Stain: NONE SEEN

## 2022-03-15 LAB — GLUCOSE, CAPILLARY
Glucose-Capillary: 143 mg/dL — ABNORMAL HIGH (ref 70–99)
Glucose-Capillary: 129 mg/dL — ABNORMAL HIGH (ref 70–99)
Glucose-Capillary: 127 mg/dL — ABNORMAL HIGH (ref 70–99)
Glucose-Capillary: 143 mg/dL — ABNORMAL HIGH (ref 70–99)
Glucose-Capillary: 133 mg/dL — ABNORMAL HIGH (ref 70–99)
Glucose-Capillary: 125 mg/dL — ABNORMAL HIGH (ref 70–99)

## 2022-03-15 LAB — BASIC METABOLIC PANEL
Anion gap: 8 (ref 5–15)
BUN: 14 mg/dL (ref 8–23)
CO2: 28 mmol/L (ref 22–32)
Calcium: 8.7 mg/dL — ABNORMAL LOW (ref 8.9–10.3)
Chloride: 103 mmol/L (ref 98–111)
Creatinine, Ser: 0.6 mg/dL — ABNORMAL LOW (ref 0.61–1.24)
GFR, Estimated: >60 mL/min (ref >60)
Glucose, Bld: 138 mg/dL — ABNORMAL HIGH (ref 70–99)
Potassium: 4.3 mmol/L (ref 3.5–5.1)
Sodium: 139 mmol/L (ref 135–145)

## 2022-03-15 MED FILL — Medication: Qty: 1 | Status: AC

## 2022-03-15 NOTE — Progress Notes (Addendum)
Physical Therapy Treatment ?Patient Details ?Name: Todd Estrada ?MRN: [id number] ?DOB: 1961-07-09 ?Today's Date: 03/15/2022 ? ? ?History of Present Illness 61 y/o male with a history of Left MCA stroke in 0017 with a complicated post stroke course presented on 3/15 with a 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  He was extubated following procedure, but reintubated due to respiratory failure.  Self extubated overnight 3/19, now on venturi mask.  Re-intubated on 3/21 and bronchoscopy and tracheostomy performed on 3/22. ? ?  ?PT Comments  ? ? Patient progressing slowly, well fatigued with seated activities today for hygiene.  He was pushing more in standing today and kept trying to pull off condom cath despite frequent redirection.  Patient sitting balance with S while performing about 20% of activities for oral hygiene, washing face and combing hair.  Feel he continues to need skilled PT during acute stay.  Recommend acute inpatient rehab at d/c.    ?Recommendations for follow up therapy are one component of a multi-disciplinary discharge planning process, led by the attending physician.  Recommendations may be updated based on patient status, additional functional criteria and insurance authorization. ? ?Follow Up Recommendations ? Acute inpatient rehab (3hours/day) ?  ?  ?Assistance Recommended at Discharge Frequent or constant Supervision/Assistance  ?Patient can return home with the following A lot of help with bathing/dressing/bathroom;Direct supervision/assist for medications management;Assist for transportation;Help with stairs or ramp for entrance;Two people to help with walking and/or transfers ?  ?Equipment Recommendations ? Wheelchair (measurements PT);Wheelchair cushion (measurements PT);Hospital bed  ?  ?Recommendations for Other Services   ? ? ?  ?Precautions / Restrictions Precautions ?Precautions: Fall ?Precaution Comments: mitts, posey, wrist restraint on L, R inattention  ?   ? ?Mobility ? Bed Mobility ?Overal bed mobility: Needs Assistance ?Bed Mobility: Supine to Sit ?  ?  ?Supine to sit: HOB elevated, Mod assist ?Sit to supine: Mod assist ?  ?General bed mobility comments: bringing L leg off independently, increased time with cues and assist for R LE and R UE; to supine assist for R LE and positioning ?  ? ?Transfers ?Overall transfer level: Needs assistance ?Equipment used: 2 person hand held assist ?Transfers: Sit to/from Stand ?Sit to Stand: Mod assist, +2 safety/equipment ?  ?  ?  ?  ?  ?General transfer comment: stood twice, once working on weight shifts, and second for perineal hygiene due to incontinent of BM ?  ? ?Ambulation/Gait ?  ?  ?  ?  ?  ?  ?  ?  ? ? ?Stairs ?  ?  ?  ?  ?  ? ? ?Wheelchair Mobility ?  ? ?Modified Rankin (Stroke Patients Only) ?Modified Rankin (Stroke Patients Only) ?Pre-Morbid Rankin Score: Slight disability ?Modified Rankin: Severe disability ? ? ?  ?Balance Overall balance assessment: Needs assistance ?Sitting-balance support: Feet supported ?Sitting balance-Leahy Scale: Poor ?Sitting balance - Comments: not pushing, but supervision to  minguard for safety seated EOB; sat to participate in washing face, oral hygiene and combing his hair ?Postural control: Right lateral lean ?Standing balance support: Bilateral upper extremity supported ?Standing balance-Leahy Scale: Poor ?Standing balance comment: leaning to R today in standing with A for R knee control and balance ?  ?  ?  ?  ?  ?  ?  ?  ?  ?  ?  ?  ? ?  ?Cognition Arousal/Alertness: Awake/alert ?Behavior During Therapy: Impulsive ?Overall Cognitive Status: Impaired/Different from baseline ?Area of Impairment: Following commands,  Awareness, Memory ?  ?  ?  ?  ?  ?  ?  ?  ?  ?Current Attention Level: Focused ?Memory: Decreased short-term memory, Decreased recall of precautions ?Following Commands: Follows one step commands consistently, Follows one step commands with increased time ?Safety/Judgement:  Decreased awareness of deficits, Decreased awareness of safety ?  ?Problem Solving: Slow processing ?General Comments: repeating commands throughout session for keeping from pulling off condom cath ?  ?  ? ?  ?Exercises   ? ?  ?General Comments General comments (skin integrity, edema, etc.): remains on trach collar 40% w/ t-bar; VSS trhoughout, some clear/white scretions expectorated ?  ?  ? ?Pertinent Vitals/Pain Pain Assessment ?Pain Assessment: CPOT ?Facial Expression: Relaxed, neutral ?Body Movements: Restlessness ?Muscle Tension: Relaxed ?Compliance with ventilator (intubated pts.): N/A ?Vocalization (extubated pts.): Talking in normal tone or no sound ?CPOT Total: 2 ?Pain Descriptors / Indicators: Grimacing ?Pain Intervention(s): Monitored during session, Repositioned  ? ? ?Home Living   ?  ?  ?  ?  ?  ?  ?  ?  ?  ?   ?  ?Prior Function    ?  ?  ?   ? ?PT Goals (current goals can now be found in the care plan section) Progress towards PT goals: Progressing toward goals ? ?  ?Frequency ? ? ? Min 4X/week ? ? ? ?  ?PT Plan Current plan remains appropriate  ? ? ?Co-evaluation   ?  ?  ?  ?  ? ?  ?AM-PAC PT "6 Clicks" Mobility   ?Outcome Measure ? Help needed turning from your back to your side while in a flat bed without using bedrails?: A Little ?Help needed moving from lying on your back to sitting on the side of a flat bed without using bedrails?: A Lot ?Help needed moving to and from a bed to a chair (including a wheelchair)?: Total ?Help needed standing up from a chair using your arms (e.g., wheelchair or bedside chair)?: A Lot ?Help needed to walk in hospital room?: Total ?Help needed climbing 3-5 steps with a railing? : Total ?6 Click Score: 10 ? ?  ?End of Session Equipment Utilized During Treatment: Gait belt;Oxygen ?Activity Tolerance: Patient tolerated treatment well ?Patient left: in bed;with call bell/phone within reach;with restraints reapplied ?Nurse Communication: Other (comment) (had BM in bed) ?PT  Visit Diagnosis: Other abnormalities of gait and mobility (R26.89);Other symptoms and signs involving the nervous system (R29.898);Hemiplegia and hemiparesis ?Hemiplegia - Right/Left: Right ?Hemiplegia - dominant/non-dominant: Dominant ?Hemiplegia - caused by: Cerebral infarction ?  ? ? ?Time: 8115-7262 ?PT Time Calculation (min) (ACUTE ONLY): 29 min ? ?Charges:  $Therapeutic Activity: 8-22 mins ?$Self Care/Home Management: 8-22          ?          ? ?Magda Kiel, PT ?Acute Rehabilitation Services ?MBTDH:[phone] ?[phone] ?03/15/2022 ? ? ? ?Reginia Naas ?03/15/2022, 5:36 PM ? ?

## 2022-03-15 NOTE — Progress Notes (Signed)
? ?NAME:  Todd Estrada, MRN:  [id number], DOB:  [birth year], LOS: 61 ?ADMISSION DATE:  03/02/2022, CONSULTATION DATE:  3/16 ?REFERRING MD:  Erlinda Hong, CHIEF COMPLAINT:  Confusion, seizure  ? ?History of Present Illness:  ?61 y/o male with a history of Left MCA stroke in 1779 with a complicated post stroke course presented on 3/15 with a 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  Intubated on 3/16 for airway protection. ? ?Pertinent  Medical History  ?Hyeprtension ?Left MCA stroke ?Chronic combined CHF ? ?Significant Hospital Events: ?Including procedures, antibiotic start and stop dates in addition to other pertinent events   ?3/15 admitted for acute L MCA stroke due to L ICA occlusion with first time seizure s/p revascularization of L ICA occlusion ?3/16 Intubated for airway protection; started unasyn; echo LVEF 40-45%, RVSP normal ?3/17 MRI Brain showed acute infarct in left frontal, temporal and parietal lobes with findings worrisome for acute infarct, left vert occluded; CT head did not show hemorrhage; respiratory notes thick secretions,  ?3/18 following some commands on exam, extubated the re-intubated due to hypoxemia, inability to clear secretions ?3/19 self extubated overnight, remains on NRB ?3/21 required reintubation for inability to protect airway and aspiration  ?3/22 bedside perc tracheostomy with bronchoscopy  ?3/24 PEG ?3/26 started on trach collar ? ?Interim History / Subjective:  ?Patient has been tolerating trach collar. He is more awake and interactive today. Following commands consistently.  ? ?Objective   ?Blood pressure 121/73, pulse 83, temperature 97.8 ?F (36.6 ?C), temperature source Axillary, resp. rate (!) 26, height 6' (1.829 m), weight 79 kg, SpO2 94 %. ?   ?FiO2 (%):  [35 %-40 %] 35 %  ? ?Intake/Output Summary (Last 24 hours) at 03/15/2022 0714 ?Last data filed at 03/15/2022 0500 ?Gross per 24 hour  ?Intake 1941.11 ml  ?Output 1140 ml  ?Net 801.11 ml  ? ?Filed Weights  ?  03/13/22 0500 03/14/22 0500 03/15/22 0500  ?Weight: 80.1 kg 79.6 kg 79 kg  ? ? ?Examination: ?General:  chronically ill appearing unkept elderly male, Resting comfortably in bed ?HENT: NCAT OP clear tracheostomy in place with thick secretions  ?PULM: diffuse crackles; no wheezing. normal effort ?CV: RRR, no mgr ?GI: BS+, soft, nontender ?MSK: normal bulk and tone ?Neuro: awake, alert, moves left arm/leg; follows commands; left gaze preference; continued right hemineglect ? ?Resolved Hospital Problem list   ?Septic shock due to aspiration pneumonia> resolved ?Constipation ? ?Assessment & Plan:  ?Acute encephalopathy due to left MCA stroke from left ICA occlusion requiring thrombectomy and stent placement on 3/15 ?Seizure disorder ?Agitated delirium on 3/19> risk to self pulling at tubes, lines ?Continue Keppra  ?Secondary stroke prevention per neurology/stroke service ?BP goals and glucose goals per stroke service ?Continue statin and dual antiplatelet agent per stroke service ?Imaging per stroke service ?PT/OT/SLP ? ?Acute hypoxemic respiratory failure due to aspiration pneumonia s/p tracheostomy > improving ?Thick pulmonary secretions ?Concern for emphysema/COPD > not in history but exam supports ?Patient has been tolerating trach collar for 48 hours as of this morning. Able to generate cough to clear secretions. Can transfer out of ICU ?- Trach care per routine ?- Continue chest PT ?- Change to cuffless trach 3/29 ?- Continue supplemental oxygen ?- Continue Brovana, pulmicort, yupelri  ?- F/u trach culture  ? ?Combined systolic and diastolic heart failure, chronic ?Hypertension ?Monitor hemodynamics ?Hydralazine prn SBP > 160 ? ?RUE thrombophlebitis ?Warm compresses, monitor ? ?Best Practice (right click and "Reselect all SmartList Selections" daily)  ? ?  Diet/type: tubefeeds ?DVT prophylaxis: LMWH ?GI prophylaxis: PPI ?Lines: N/A ?Foley:  N/A ?Code Status:  full code ?Last date of multidisciplinary goals of care  discussion [per stroke service] ? ?Labs   ?CBC: ?Recent Labs  ?Lab 03/10/22 ?0459 03/11/22 ?0253 03/12/22 ?0559 03/13/22 ?4656 03/14/22 ?0259  ?WBC 8.8 9.4 10.5 11.3* 12.7*  ?HGB 14.1 13.7 13.5 12.7* 13.5  ?HCT 42.9 42.7 40.9 39.3 41.8  ?MCV 92.5 93.6 92.1 92.7 92.1  ?PLT 191 177 200 222 261  ? ? ?Basic Metabolic Panel: ?Recent Labs  ?Lab 03/10/22 ?8127 03/11/22 ?0253 03/13/22 ?5170 03/14/22 ?0174 03/15/22 ?9449  ?NA 143 143 139 138 139  ?K 3.2* 3.7 3.5 3.7 4.3  ?CL 106 108 103 105 103  ?CO2 29 26 30 29 28   ?GLUCOSE 102* 87 127* 122* 138*  ?BUN 16 14 12 11 14   ?CREATININE 0.78 0.73 0.57* 0.62 0.60*  ?CALCIUM 8.3* 8.4* 8.1* 8.2* 8.7*  ?MG  --  2.3  --   --   --   ? ?GFR: ?Estimated Creatinine Clearance: 106.4 mL/min (A) (by C-G formula based on SCr of 0.6 mg/dL (L)). ?Recent Labs  ?Lab 03/11/22 ?0253 03/12/22 ?0559 03/13/22 ?6759 03/14/22 ?0259  ?WBC 9.4 10.5 11.3* 12.7*  ? ? ?Liver Function Tests: ?Recent Labs  ?Lab 03/10/22 ?1638 03/13/22 ?0709  ?AST 16 26  ?ALT 14 24  ?ALKPHOS 47 81  ?BILITOT 1.2 0.6  ?PROT 6.0* 5.5*  ?ALBUMIN 2.5* 2.1*  ? ?No results for input(s): LIPASE, AMYLASE in the last 168 hours. ?No results for input(s): AMMONIA in the last 168 hours. ? ?ABG ?   ?Component Value Date/Time  ? PHART 7.378 03/08/2022 1732  ? PCO2ART 55.9 (H) 03/08/2022 1732  ? PO2ART 98 03/08/2022 1732  ? HCO3 32.8 (H) 03/08/2022 1732  ? TCO2 34 (H) 03/08/2022 1732  ? O2SAT 97 03/08/2022 1732  ?  ? ?Coagulation Profile: ?No results for input(s): INR, PROTIME in the last 168 hours. ? ?Cardiac Enzymes: ?No results for input(s): CKTOTAL, CKMB, CKMBINDEX, TROPONINI in the last 168 hours. ? ?HbA1C: ?Hgb A1c MFr Bld  ?Date/Time Value Ref Range Status  ?03/03/2022 04:30 AM 5.4 4.8 - 5.6 % Final  ?  Comment:  ?  (NOTE) ?        Prediabetes: 5.7 - 6.4 ?        Diabetes: >6.4 ?        Glycemic control for adults with diabetes: <7.0 ?  ?08/19/2021 07:49 AM 5.5 4.8 - 5.6 % Final  ?  Comment:  ?  (NOTE) ?Pre diabetes:           5.7%-6.4% ? ?Diabetes:              >6.4% ? ?Glycemic control for   <7.0% ?adults with diabetes ?  ? ? ?CBG: ?Recent Labs  ?Lab 03/14/22 ?1147 03/14/22 ?1535 03/14/22 ?1925 03/14/22 ?2316 03/15/22 ?0336  ?GLUCAP 140* 106* 130* 135* 143*  ? ?Critical care time:  ?  ? ? ? ? ? ? ? ?

## 2022-03-16 DIAGNOSIS — I63512 Cerebral infarction due to unspecified occlusion or stenosis of left middle cerebral artery: Secondary | ICD-10-CM | POA: Diagnosis not present

## 2022-03-16 LAB — GLUCOSE, CAPILLARY
Glucose-Capillary: 144 mg/dL — ABNORMAL HIGH (ref 70–99)
Glucose-Capillary: 147 mg/dL — ABNORMAL HIGH (ref 70–99)
Glucose-Capillary: 133 mg/dL — ABNORMAL HIGH (ref 70–99)
Glucose-Capillary: 117 mg/dL — ABNORMAL HIGH (ref 70–99)
Glucose-Capillary: 135 mg/dL — ABNORMAL HIGH (ref 70–99)
Glucose-Capillary: 123 mg/dL — ABNORMAL HIGH (ref 70–99)

## 2022-03-16 LAB — BASIC METABOLIC PANEL
Anion gap: 10 (ref 5–15)
BUN: 15 mg/dL (ref 8–23)
CO2: 28 mmol/L (ref 22–32)
Calcium: 8.8 mg/dL — ABNORMAL LOW (ref 8.9–10.3)
Chloride: 100 mmol/L (ref 98–111)
Creatinine, Ser: 0.66 mg/dL (ref 0.61–1.24)
GFR, Estimated: >60 mL/min (ref >60)
Glucose, Bld: 137 mg/dL — ABNORMAL HIGH (ref 70–99)
Potassium: 4.2 mmol/L (ref 3.5–5.1)
Sodium: 138 mmol/L (ref 135–145)

## 2022-03-16 MED ORDER — POLYETHYLENE GLYCOL 3350 17 G PO PACK: 17.0000 g | PACK | Freq: Every day | ORAL | Status: DC | PRN

## 2022-03-16 MED ORDER — SODIUM CHLORIDE 3 % IN NEBU
4.0000 mL | INHALATION_SOLUTION | Freq: Two times a day (BID) | RESPIRATORY_TRACT | Status: AC
  Administered 2022-03-16 – 2022-03-19 (×6): 4 mL via RESPIRATORY_TRACT
  Filled 2022-03-16 (×7): qty 4

## 2022-03-16 NOTE — Progress Notes (Signed)
Physical Therapy Treatment ?Patient Details ?Name: Todd Estrada ?MRN: [id number] ?DOB: 26-Apr-1961 ?Today's Date: 03/16/2022 ? ? ?History of Present Illness 61 y/o male with a history of Left MCA stroke in 4580 with a complicated post stroke course presented on 3/15 with a 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  He was extubated following procedure, but reintubated due to respiratory failure.  Self extubated overnight 3/19, now on venturi mask.  Re-intubated on 3/21 and bronchoscopy and tracheostomy performed on 3/22. ? ?  ?PT Comments  ? ? Pt drowsy upon PT and OT arrival to room, upon review did receive Keppra prior to session. Pt requiring max-total +2 assist for to/from EOB, once sitting EOB follows some one-step commands and benefits from multimodal cuing especially demonstrative. Pt continues to present with R inattention, does not attend OT or PT when placed to pt's R. Sitting balance is poor throughout session, benefits from pelvic and truncal facilitation posteriorly and anteriorly from PT. Will continue to follow acutely and progress mobility as able.  ? ?  ?Recommendations for follow up therapy are one component of a multi-disciplinary discharge planning process, led by the attending physician.  Recommendations may be updated based on patient status, additional functional criteria and insurance authorization. ? ?Follow Up Recommendations ? Acute inpatient rehab (3hours/day) ?  ?  ?Assistance Recommended at Discharge Frequent or constant Supervision/Assistance  ?Patient can return home with the following A lot of help with bathing/dressing/bathroom;Direct supervision/assist for medications management;Assist for transportation;Help with stairs or ramp for entrance;Two people to help with walking and/or transfers ?  ?Equipment Recommendations ? Wheelchair (measurements PT);Wheelchair cushion (measurements PT);Hospital bed  ?  ?Recommendations for Other Services   ? ? ?  ?Precautions  / Restrictions Precautions ?Precautions: Fall ?Precaution Comments: posey belt, wrist restraint on L, R inattention, trach collar 8/35%FiO2 ?Restrictions ?Weight Bearing Restrictions: No  ?  ? ?Mobility ? Bed Mobility ?Overal bed mobility: Needs Assistance ?Bed Mobility: Supine to Sit, Sit to Supine ?  ?  ?Supine to sit: Total assist, +2 for physical assistance ?Sit to supine: Total assist, Max assist, +2 for physical assistance ?  ?General bed mobility comments: total +2 for supine>sit for trunk and LE management, pt lethargic on initial move to EOB. Max +2 for return to supine as pt initates trunk lower and prop on elbow, further assist for LE lifting into bed and boost up upon return to supine. ?  ? ?Transfers ?  ?  ?  ?  ?  ?  ?  ?  ?  ?General transfer comment: NT given level of arousal ?  ? ?Ambulation/Gait ?  ?  ?  ?  ?  ?  ?  ?  ? ? ?Stairs ?  ?  ?  ?  ?  ? ? ?Wheelchair Mobility ?  ? ?Modified Rankin (Stroke Patients Only) ?Modified Rankin (Stroke Patients Only) ?Pre-Morbid Rankin Score: Slight disability ?Modified Rankin: Severe disability ? ? ?  ?Balance Overall balance assessment: Needs assistance ?Sitting-balance support: Feet supported ?Sitting balance-Leahy Scale: Poor ?Sitting balance - Comments: prefers propping on LUE, requires pelvic and truncal facilitation to sit upright. If not propped on LUE, heavy R lateral lean with no attempt to correct even with max cuing ?Postural control: Right lateral lean ?  ?  ?  ?  ?  ?  ?  ?  ?  ?  ?  ?  ?  ?  ?  ? ?  ?Cognition Arousal/Alertness: Awake/alert ?Behavior  During Therapy: Impulsive ?Overall Cognitive Status: Impaired/Different from baseline ?Area of Impairment: Following commands, Awareness, Memory ?  ?  ?  ?  ?  ?  ?  ?  ?  ?Current Attention Level: Focused ?Memory: Decreased short-term memory, Decreased recall of precautions ?Following Commands: Follows one step commands with increased time, Follows one step commands inconsistently ?Safety/Judgement:  Decreased awareness of deficits, Decreased awareness of safety ?  ?Problem Solving: Slow processing ?General Comments: follows commands ~25% of the time when sitting EOB and usually requires multimodal cuing. Does not follow commands when given from R side given inattention ?  ?  ? ?  ?Exercises   ? ?  ?General Comments General comments (skin integrity, edema, etc.): multiple bouts of secretions, managed by RN at end of session and SLP during session ?  ?  ? ?Pertinent Vitals/Pain Pain Assessment ?Pain Assessment: Faces ?Faces Pain Scale: Hurts a little bit ?Pain Location: generalized ?Pain Descriptors / Indicators: Grimacing ?Pain Intervention(s): Limited activity within patient's tolerance, Monitored during session, Repositioned  ? ? ?Home Living   ?  ?  ?  ?  ?  ?  ?  ?  ?  ?   ?  ?Prior Function    ?  ?  ?   ? ?PT Goals (current goals can now be found in the care plan section) Acute Rehab PT Goals ?Patient Stated Goal: unable to state ?PT Goal Formulation: Patient unable to participate in goal setting ?Time For Goal Achievement: 03/24/22 ?Potential to Achieve Goals: Fair ?Progress towards PT goals: Progressing toward goals ? ?  ?Frequency ? ? ? Min 4X/week ? ? ? ?  ?PT Plan Current plan remains appropriate  ? ? ?Co-evaluation PT/OT/SLP Co-Evaluation/Treatment: Yes ?Reason for Co-Treatment: For patient/therapist safety;To address functional/ADL transfers;Complexity of the patient's impairments (multi-system involvement) ?  ?  ?  ? ?  ?AM-PAC PT "6 Clicks" Mobility   ?Outcome Measure ? Help needed turning from your back to your side while in a flat bed without using bedrails?: A Lot ?Help needed moving from lying on your back to sitting on the side of a flat bed without using bedrails?: Total ?Help needed moving to and from a bed to a chair (including a wheelchair)?: Total ?Help needed standing up from a chair using your arms (e.g., wheelchair or bedside chair)?: Total ?Help needed to walk in hospital room?:  Total ?Help needed climbing 3-5 steps with a railing? : Total ?6 Click Score: 7 ? ?  ?End of Session Equipment Utilized During Treatment: Oxygen ?Activity Tolerance: Patient tolerated treatment well ?Patient left: in bed;with call bell/phone within reach;with restraints reapplied;with bed alarm set ?Nurse Communication: Mobility status ?PT Visit Diagnosis: Other abnormalities of gait and mobility (R26.89);Other symptoms and signs involving the nervous system (R29.898);Hemiplegia and hemiparesis ?Hemiplegia - Right/Left: Right ?Hemiplegia - dominant/non-dominant: Non-dominant ?Hemiplegia - caused by: Cerebral infarction ?  ? ? ?Time: 1020-1051 ?PT Time Calculation (min) (ACUTE ONLY): 31 min ? ?Charges:  $Neuromuscular Re-education: 8-22 mins          ?          ?Stacie Glaze, PT DPT ?Acute Rehabilitation Services ?Pager [phone]  ?Office [phone] ? ? ? ?Todd Estrada ?03/16/2022, 11:20 AM ? ?

## 2022-03-16 NOTE — Progress Notes (Signed)
Nutrition Follow-up ? ?DOCUMENTATION CODES:  ? ?Non-severe (moderate) malnutrition in context of chronic illness ? ?INTERVENTION:  ? ?Tube feeds via PEG: ?- Osmolite 1.5 @ 55 ml/hr (1320 ml/day) ?- ProSource TF 90 ml BID ?  ?Tube feeding regimen provides 2140 kcal, 133 grams of protein, and 1008 ml of H2O.  ?  ?- MVI with minerals daily per tube ? ?- 200 ml free water every 12 hours  ?Total free water: 1408 ml ? ? ?NUTRITION DIAGNOSIS:  ? ?Moderate Malnutrition related to chronic illness (L MCA stroke) as evidenced by moderate fat depletion, moderate muscle depletion. ?Ongoing.  ? ?GOAL:  ? ?Patient will meet greater than or equal to 90% of their needs ?Met with TF at goal.  ? ?MONITOR:  ? ?TF tolerance, Labs, Weight trends ? ?REASON FOR ASSESSMENT:  ? ?Consult ?Enteral/tube feeding initiation and management ? ?ASSESSMENT:  ? ?Pt with PMH of HTN, CHF, L MCA stroke 2022 with mild to moderate aphasia and R sided weakness now admitted with L MCA due to L ICA occlusion and seizure-like activity. ? ?Pt discussed during ICU rounds and with RN.  ?Pt on trach collar >48 hours, plan to transfer out. Pt with R- sided neglect.  ?Plan for CIR when appropriate  ? ?03/18 - extubated, later reintubated ?03/19 - pt self-extubated, NG tube placement attempted but unsuccessful, R nare epistaxis, rhino rocket placed ?03/20 - Cortrak not attempted due to rhino rocket ?3/21 - intubated  ?3/22 - trach placed, cortrak and IR unable to place nasogastric tube ?3/24 - PEG placed ? ? ?Medications reviewed and include: colace, folic acid, MVI with minerals, protonix, thiamine  ? ?Labs reviewed ?CBG's: 125-147  ? ?Diet Order:   ?Diet Order   ? ?       ?  Diet NPO time specified Except for: Other (See Comments)  Diet effective now       ?  ? ?  ?  ? ?  ? ? ?EDUCATION NEEDS:  ? ?Not appropriate for education at this time ? ?Skin:  Skin Assessment: Reviewed RN Assessment (tongue with wound) ?Skin Integrity Issues:: Stage II ?Stage II: NA ? ?Last  BM:  3/28 x 3 ? ?Height:  ? ?Ht Readings from Last 1 Encounters:  ?03/05/22 6' (1.829 m)  ? ? ?Weight:  ? ?Wt Readings from Last 1 Encounters:  ?03/15/22 79 kg  ? ? ?BMI:  Body mass index is 23.62 kg/m?. ? ?Estimated Nutritional Needs:  ? ?Kcal:  2100-2300 ? ?Protein:  125-140 grams ? ?Fluid:  > 2 L/day ? ?Lockie Pares., RD, LDN, CNSC ?See AMiON for contact information  ? ?

## 2022-03-16 NOTE — Progress Notes (Signed)
?PROGRESS NOTE ? ? ?Todd Estrada  JOA:[id number] DOB: 12/03/1961 DOA: 03/02/2022 ?PCP: Pcp, No  ?Brief Narrative:  ?61 year old white male [place] dwelling ?Prior left MCA/CVA + petechial hemorrhage, chronic combined CHF ?Known history of stage II pressure injuries ?HTN HLD?  EtOH ? ?Admit 03/02/2022 seizure-like activity + aphasia CTA head neck = occlusion ICA-->thrombectomy with TICI 2 results-extubated transferred to neuro ICU ?Placed on Keppra ?CCM evaluated patient secondary to unresponsiveness, aspiration ?Underwent tracheostomy 03/09/2022 with plans to change to cuffless trach 3/29 ? ?3/15 admitted for acute L MCA stroke due to L ICA occlusion with first time seizure s/p revascularization of L ICA occlusion ?3/16 Intubated for airway protection; started unasyn; echo LVEF 40-45%, RVSP normal ?3/17 MRI Brain showed acute infarct in left frontal, temporal and parietal lobes with findings worrisome for acute infarct, left vert occluded; CT head did not show hemorrhage; respiratory notes thick secretions,  ?3/18 following some commands on exam, extubated the re-intubated due to hypoxemia, inability to clear secretions ?3/19 self extubated overnight, remains on NRB ?3/21 required reintubation for inability to protect airway and aspiration  ?3/22 bedside perc tracheostomy with bronchoscopy  ?3/24 PEG ?3/26 started on trach collar ? ?Hospital-Problem based course ? ?Encephalopathy 2/2 left MCA stroke + occlusion/thrombectomy 3/15 ?Agitated delirium 3/19 ?Seizure on admission ?Defer to neurology further planning/management-appreciate their input-patient is stable to go out of stepdown to regular floor on 3 W. ?He was transiently on Precedex 3/19 but is much better now ?He is still aphasic and requiring restraints as he pulls at his condom catheter and lines ?Continue Keppra 500 twice daily as per neurology ?Continue GDMT atorvastatin 40+ ASA 81+ Plavix 75 hydralazine 10 to 40 mg every 4 as needed systolic >601 ?Septic  shock  2/2 Aspiration PNA ?Hypoxic respiratory failure  ? + trach ?Significant dysphagia status post PEG tube placement 3/24 [gen surgery] ?Currently on tube feeds at 55, continue free water 200 every 12 ? we will repeat ?CBC as well as CXR in a.m. ?Presumed COPD + tobacco ~his stroke ?Nicotine patch--Brovana 15 twice daily Pulmicort 0.5 twice daily Yupelri 175 daily hypertonic saline twice daily per respiratory and Robitussin ?Systolic diastolic heart failure ?Prior to admission not on goal-directed therapy?  Not on ACE?-Eventually resume metoprolol 12.5 twice daily already on aspirin ?EF this admission = 40-45% ?A1c 5.4 ?Given significant injury and morbidity-would not aggressively control A1c below 6.0-needs follow-up in the outpatient setting for this-current trends 133-147 ?Right upper extremity thrombophlebitis ?Stage II pressure injury from prior to admission ? ?DVT prophylaxis:  ?Code Status:  ?Family Communication: d/w [name] [phone]  ?Disposition:  ?Status is: Inpatient ?Remains inpatient appropriate because:  ? ?need for further rehab--CIR ?  ?Consultants:  ?neurology ? ?Procedures: multiple ? ?Antimicrobials: comp[leted  ? ? ?Subjective: ? ?Non-verbal  ?Foll commands no distress ?Thick secretions ? ?Objective: ?Vitals:  ? 03/16/22 0400 03/16/22 0500 03/16/22 0600 03/16/22 0700  ?BP: (!) 144/79 130/71 98/69 112/68  ?Pulse: 86 80 79 80  ?Resp: (!) 25 (!) 24 (!) 24 (!) 22  ?Temp: 98.4 ?F (36.9 ?C)     ?TempSrc: Axillary     ?SpO2: 94% 90% 100% 100%  ?Weight:      ?Height:      ? ? ?Intake/Output Summary (Last 24 hours) at 03/16/2022 0734 ?Last data filed at 03/16/2022 0700 ?Gross per 24 hour  ?Intake 1550.28 ml  ?Output 1800 ml  ?Net -249.72 ml  ? ?Filed Weights  ? 03/13/22 0500 03/14/22 0500 03/15/22 0500  ?  Weight: 80.1 kg 79.6 kg 79 kg  ? ? ?Examination: ? ?Trach collar in place -wild beard ?No ict no pallor ?Cta B ?Peg in place ?Dense deficit to the right upper extremity with inability to  follow commands with some hemineglect on the right side reacts to menance on the left side ?Can raise both legs off the bed ?Reflexes are brisk in the knees and brachial radialis ?Cannot accomplish finger-nose-finger as his left arm is in restraints ?Cannot assess psych ? ?Data Reviewed: personally reviewed  ? ?CBC ?   ?Component Value Date/Time  ? WBC 12.7 (H) 03/14/2022 0259  ? RBC 4.54 03/14/2022 0259  ? HGB 13.5 03/14/2022 0259  ? HCT 41.8 03/14/2022 0259  ? PLT 261 03/14/2022 0259  ? MCV 92.1 03/14/2022 0259  ? MCH 29.7 03/14/2022 0259  ? MCHC 32.3 03/14/2022 0259  ? RDW 12.7 03/14/2022 0259  ? LYMPHSABS 1.9 03/03/2022 0430  ? MONOABS 0.8 03/03/2022 0430  ? EOSABS 0.1 03/03/2022 0430  ? BASOSABS 0.0 03/03/2022 0430  ? ? ?  Latest Ref Rng & Units 03/16/2022  ?  5:02 AM 03/15/2022  ?  4:28 AM 03/14/2022  ?  2:59 AM  ?CMP  ?Glucose 70 - 99 mg/dL 137   138   122    ?BUN 8 - 23 mg/dL 15   14   11     ?Creatinine 0.61 - 1.24 mg/dL 0.66   0.60   0.62    ?Sodium 135 - 145 mmol/L 138   139   138    ?Potassium 3.5 - 5.1 mmol/L 4.2   4.3   3.7    ?Chloride 98 - 111 mmol/L 100   103   105    ?CO2 22 - 32 mmol/L 28   28   29     ?Calcium 8.9 - 10.3 mg/dL 8.8   8.7   8.2    ? ? ? ?Radiology Studies: ?No results found. ? ? ?Scheduled Meds: ? arformoterol  15 mcg Nebulization BID  ? aspirin  81 mg Per Tube Daily  ? atorvastatin  40 mg Per Tube Daily  ? budesonide (PULMICORT) nebulizer solution  0.5 mg Nebulization BID  ? chlorhexidine gluconate (MEDLINE KIT)  15 mL Mouth Rinse BID  ? Chlorhexidine Gluconate Cloth  6 each Topical Q0600  ? clopidogrel  75 mg Per Tube Daily  ? docusate  100 mg Per Tube BID  ? enoxaparin (LOVENOX) injection  40 mg Subcutaneous Q24H  ? feeding supplement (PROSource TF)  90 mL Per Tube BID  ? folic acid  1 mg Per Tube Daily  ? free water  200 mL Per Tube Q12H  ? guaiFENesin  15 mL Per Tube Q6H  ? levETIRAcetam  500 mg Per Tube BID  ? mouth rinse  15 mL Mouth Rinse QID  ? multivitamin with minerals  1  tablet Per Tube Daily  ? nicotine  21 mg Transdermal Daily  ? pantoprazole sodium  40 mg Per Tube Q24H  ? revefenacin  175 mcg Nebulization Daily  ? sodium chloride HYPERTONIC  4 mL Nebulization BID  ? thiamine  100 mg Per Tube Daily  ? ?Continuous Infusions: ? sodium chloride 10 mL/hr at 03/14/22 0600  ? sodium chloride Stopped (03/15/22 0904)  ? feeding supplement (OSMOLITE 1.5 CAL) 1,000 mL (03/16/22 0535)  ? ? ? LOS: 14 days  ? ?Time spent: 47 minutes ? ?Nita Sells, MD ?[facility] ?To contact the attending provider between 7A-7P or the  covering provider during after hours 7P-7A, please log into the web site [email or web address] and access using universal [place] password for that web site. If you do not have the password, please call the hospital operator. ? ?03/16/2022, 7:34 AM  ? ? ?

## 2022-03-16 NOTE — Progress Notes (Signed)
STROKE TEAM PROGRESS NOTE  ? ?INTERVAL HISTORY ?Patient is seen in his room with no family at the bedside.  He remains been hemodynamically stable overnight and has had no acute events.  His neurological exam is unchanged and, he remains aphasic with dense right hemiplegia but follows occasional midline and a few one-step commands and he has been on trach collar since 48 hours for trial of weaning of ventilatory support and seems to be tolerating it well...  Labs from this morning BMP and glucose are fine.  Appreciate medical hospitalist team taking over his care ? ?Vitals:  ? 03/16/22 1100 03/16/22 1200 03/16/22 1300 03/16/22 1400  ?BP: 102/68 113/71 111/67 110/79  ?Pulse: 84 78 77 76  ?Resp: (!) 24 (!) 25 19 (!) 23  ?Temp:  97.9 ?F (36.6 ?C)    ?TempSrc:  Axillary    ?SpO2: 100% 95% 97% 98%  ?Weight:      ?Height:      ? ?CBC:  ?Recent Labs  ?Lab 03/13/22 ?0709 03/14/22 ?1610  ?WBC 11.3* 12.7*  ?HGB 12.7* 13.5  ?HCT 39.3 41.8  ?MCV 92.7 92.1  ?PLT 222 261  ? ?Basic Metabolic Panel:  ?Recent Labs  ?Lab 03/11/22 ?0253 03/13/22 ?0709 03/15/22 ?0428 03/16/22 ?0502  ?NA 143   < > 139 138  ?K 3.7   < > 4.3 4.2  ?CL 108   < > 103 100  ?CO2 26   < > 28 28  ?GLUCOSE 87   < > 138* 137*  ?BUN 14   < > 14 15  ?CREATININE 0.73   < > 0.60* 0.66  ?CALCIUM 8.4*   < > 8.7* 8.8*  ?MG 2.3  --   --   --   ? < > = values in this interval not displayed.  ? ?Lipid Panel:  ?Recent Labs  ?Lab 03/12/22 ?0559  ?TRIG 69  ? ? ?IMAGING past 24 hours ?DG Abd 1 View ? ?Result Date: 03/04/2022 ?CLINICAL DATA:  Stroke Pre MRI screening EXAM: ABDOMEN - 1 VIEW COMPARISON:  None. FINDINGS: No dilated loops of bowel to indicate ileus or obstruction. Extensive atherosclerotic arterial calcifications are noted. Mild degenerative changes present lower lumbar spine. Enteric tube extensive left upper quadrant in the expected site of the stomach. No abnormal metallic densities are identified, however evaluation is limited due to multiple overlying external  artifacts. IMPRESSION: No metallic density foreign body identified within the visualized abdomen. Electronically Signed   By: Miachel Roux M.D.   On: 03/04/2022 13:32  ? ?CT HEAD WO CONTRAST (5MM) ? ?Result Date: 03/05/2022 ?CLINICAL DATA:  Stroke follow-up EXAM: CT HEAD WITHOUT CONTRAST TECHNIQUE: Contiguous axial images were obtained from the base of the skull through the vertex without intravenous contrast. RADIATION DOSE REDUCTION: This exam was performed according to the departmental dose-optimization program which includes automated exposure control, adjustment of the mA and/or kV according to patient size and/or use of iterative reconstruction technique. COMPARISON:  MRI brain 03/04/2022 FINDINGS: Brain: Subacute left MCA territory infarct with unchanged areas of cortical hypoattenuation. Encephalomalacia in the left PCA territory is unchanged. No acute hemorrhage or mass effect. Vascular: No abnormal hyperdensity of the major intracranial arteries or dural venous sinuses. No intracranial atherosclerosis. Skull: The visualized skull base, calvarium and extracranial soft tissues are normal. Sinuses/Orbits: Opacification of the maxillary and sphenoid sinuses. The orbits are normal. IMPRESSION: Subacute left MCA territory infarct without acute hemorrhage or mass effect. Electronically Signed   By: Cletus Gash.D.  On: 03/05/2022 01:03  ? ?CT HEAD WO CONTRAST ? ?Result Date: 03/02/2022 ?CLINICAL DATA:  Seizure like activity, postictal, history of alcohol abuse EXAM: CT HEAD WITHOUT CONTRAST TECHNIQUE: Contiguous axial images were obtained from the base of the skull through the vertex without intravenous contrast. RADIATION DOSE REDUCTION: This exam was performed according to the departmental dose-optimization program which includes automated exposure control, adjustment of the mA and/or kV according to patient size and/or use of iterative reconstruction technique. COMPARISON:  08/20/2021 FINDINGS: Brain:  Chronic left MCA territory infarct again identified, with interval dystrophic calcification in the infarct bed since prior study. Chronic ischemic changes are again identified throughout the periventricular and subcortical white matter, left basal ganglia, and left cerebellar hemisphere. No evidence of acute infarct or hemorrhage. Lateral ventricles and remaining midline structures are unremarkable. No acute extra-axial fluid collections. No mass effect. Vascular: No hyperdense vessel or unexpected calcification. Skull: Normal. Negative for fracture or focal lesion. Sinuses/Orbits: Chronic opacification right maxillary sinus. Stable mucosal thickening left maxillary sinus. Other: None. IMPRESSION: 1. Chronic ischemic changes as above. No acute intracranial process. Electronically Signed   By: Randa Ngo M.D.   On: 03/02/2022 19:57  ? ?CT CERVICAL SPINE WO CONTRAST ? ?Result Date: 03/02/2022 ?CLINICAL DATA:  Seizure like activity, history of alcohol abuse, neck trauma EXAM: CT CERVICAL SPINE WITHOUT CONTRAST TECHNIQUE: Multidetector CT imaging of the cervical spine was performed without intravenous contrast. Multiplanar CT image reconstructions were also generated. RADIATION DOSE REDUCTION: This exam was performed according to the departmental dose-optimization program which includes automated exposure control, adjustment of the mA and/or kV according to patient size and/or use of iterative reconstruction technique. COMPARISON:  None. FINDINGS: Alignment: Alignment is anatomic. Skull base and vertebrae: No acute fracture. No primary bone lesion or focal pathologic process. Soft tissues and spinal canal: No prevertebral fluid or swelling. No visible canal hematoma. Disc levels: Multilevel cervical spondylosis, with prominent anterior osteophyte formation from C4-5 through C6-7. Circumferential disc osteophyte complex at C3-4 and C6-7 results in right greater than left neural foraminal narrowing. Multilevel facet  hypertrophy greatest from C4 through C6. Upper chest: Central airways patent.  Severe upper lobe emphysema. Other: Reconstructed images demonstrate no additional findings. IMPRESSION: 1. No acute cervical spine fracture. 2. Multilevel cervical spondylosis and facet hypertrophy as above. 3. Emphysema. Electronically Signed   By: Randa Ngo M.D.   On: 03/02/2022 20:00  ? ?MR ANGIO HEAD WO CONTRAST ? ?Addendum Date: 03/04/2022   ?ADDENDUM REPORT: 03/04/2022 17:39 ADDENDUM: Findings discussed with Dr. Leonel Ramsay via telephone at 5:35 p.m. Electronically Signed   By: Margaretha Sheffield M.D.   On: 03/04/2022 17:39  ? ?Result Date: 03/04/2022 ?CLINICAL DATA:  Neuro deficit, acute, stroke suspected; Stroke, follow up EXAM: MRI HEAD WITHOUT CONTRAST MRA HEAD WITHOUT CONTRAST TECHNIQUE: Multiplanar, multi-echo pulse sequences of the brain and surrounding structures were acquired without intravenous contrast. Angiographic images of the Circle of Willis were acquired using MRA technique without intravenous contrast. COMPARISON:  No pertinent prior exam. FINDINGS: MRI HEAD FINDINGS Brain: Acute left MCA territory infarcts predominantly involving the left frontal, parietal and temporal lobes. Infarcts involve both cortex and white matter. Small acute infarct involving the left insula. Associated edema and sulcal effacement without midline shift. Areas of susceptibility artifact and T2 hypointensity. Areas of T2 hypointensity, T1 hyperintensity and susceptibility artifact is concerning for acute hemorrhage although an element prior hemorrhage/cortical laminar necrosis is possible posteriorly. No hydrocephalus. Vascular: Major arterial flow voids are maintained at the skull base.  Skull and upper cervical spine: Normal marrow signal. Sinuses/Orbits: Paranasal sinus mucosal thickening. No acute orbital findings. Other: Small left greater than right mastoid effusions. MRA HEAD FINDINGS Anterior circulation: The intracranial ICAs are  now patent. Left ICAs attenuated, likely due to pre-existing atherosclerosis with probably moderate to severe paraclinoid ICA stenosis. Bilateral MCAs and ACAs are patent without proximal hemodynamically sign

## 2022-03-16 NOTE — Progress Notes (Signed)
Occupational Therapy Treatment ?Patient Details ?Name: Todd Estrada ?MRN: [id number] ?DOB: 12-Apr-1961 ?Today's Date: 03/16/2022 ? ? 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  He was extubated following procedure, but reintubated due to respiratory failure.  Self extubated overnight 3/19, now on venturi mask.  Re-intubated on 3/21 and bronchoscopy and tracheostomy performed on 3/22. ?  ?OT comments ? PMV donned with cuff deflated this session, SLP present to observe. Stevenson is making incremental progress; pt see in conjunction with PT for safety. He continues to require total A for all aspects of his care. Pt did minimally initiate/sustain mobility tasks and midline postural adjustments with multimodal cues. He tolerated sitting EOB for ~20 minutes with LUE propping and simple command following about 25% of the time from the L side. He continues to benefit from OT acutely. D/c recommendation remains appropriate.   ? ?Recommendations for follow up therapy are one component of a multi-disciplinary discharge planning process, led by the attending physician.  Recommendations may be updated based on patient status, additional functional criteria and insurance authorization. ?   ?Follow Up Recommendations ? Acute inpatient rehab (3hours/day)  ?  ?Assistance Recommended at Discharge Frequent or constant Supervision/Assistance  ?Patient can return home with the following ? A lot of help with walking and/or transfers;A lot of help with bathing/dressing/bathroom;Direct supervision/assist for medications management;Assist for transportation;Help with stairs or ramp for entrance ?  ?Equipment Recommendations ? Hospital bed;Tub/shower bench  ?  ?Recommendations for Other Services Rehab consult ? ?  ?Precautions / Restrictions Precautions ?Precautions:  Fall ?Precaution Comments: posey belt, wrist restraint on L, R inattention, trach collar 8/35%FiO2 ?Restrictions ?Weight Bearing Restrictions: No  ? ? ?  ? ?Mobility Bed Mobility ?Overal bed mobility: Needs Assistance ?Bed Mobility: Supine to Sit, Sit to Supine ?  ?  ?Supine to sit: Total assist, +2 for physical assistance ?Sit to supine: Total assist, Max assist, +2 for physical assistance ?  ?General bed mobility comments: total +2 for supine>sit for trunk and LE management, pt lethargic on initial move to EOB. Max +2 for return to supine as pt initates trunk lower and prop on elbow, further assist for LE lifting into bed and boost up upon return to supine. ?  ? ?Transfers ?Overall transfer level: Needs assistance ?  ?  ?  ?  ?  ?  ?  ?  ?General transfer comment: NT given level of arousal ?  ?  ?Balance Overall balance assessment: Needs assistance ?Sitting-balance support: Feet supported ?Sitting balance-Leahy Scale: Poor ?Sitting balance - Comments: prefers propping on LUE, requires pelvic and truncal facilitation to sit upright. If not propped on LUE, heavy R lateral lean with no attempt to correct even with max cuing ?Postural control: Right lateral lean ?  ?  ?  ?  ?  ?  ?  ?  ?  ?  ?  ?  ?  ?  ?   ? ?ADL either performed or assessed with clinical judgement  ? ?ADL Overall ADL's : Needs assistance/impaired ?  ?  ?  ?  ?  ?  ?  ?  ?  ?  ?  ?  ?  ?  ?  ?  ?  ?  ?  ?General ADL Comments: total A at this time - no attempt at functional ADL  task despite multimodal cueing and assist for initiation ?  ? ?Extremity/Trunk Assessment Upper Extremity Assessment ?Upper Extremity Assessment: RUE deficits/detail;LUE deficits/detail ?RUE Deficits / Details: flaccid; IP flexion contracture R hand; limited ROM into flexion of digits; at baseline able to lift and lower shoulder however non-functional hand; moving RUE at times spontaneously into adduction/synergy pattern; not moving to command ?RUE: Subluxation noted ?LUE  Deficits / Details: moving spontaneously, useing to prop onto while sitting EOB - moved to command 3x. difficult to fully assess due to LOA ?  ?Lower Extremity Assessment ?Lower Extremity Assessment: Defer to PT evaluation ?  ?  ?  ? 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 Does not blind to threat on R. Does not track past midline, L gaze throughout. ?  ?Perception Perception ?Perception: Not tested ?  ?Praxis Praxis ?Praxis: Not tested ?Praxis-Other Comments: motor planning? ?  ? ?Cognition Arousal/Alertness: Awake/alert ?Behavior During Therapy: Impulsive ?Overall Cognitive Status: Impaired/Different from baseline ?Area of Impairment: Following commands, Awareness, Memory ?  ?  ?  ?  ?  ?  ?  ?  ?  ?Current Attention Level: Focused ?Memory: Decreased short-term memory, Decreased recall of precautions ?Following Commands: Follows one step commands with increased time, Follows one step commands inconsistently ?Safety/Judgement: Decreased awareness of deficits, Decreased awareness of safety ?Awareness: Intellectual ?Problem Solving: Slow processing ?General Comments: follows commands ~25% of the time when sitting EOB and usually requires multimodal cuing. Does not follow commands when given from R side given inattention/neglect? ?  ?  ?   ?Exercises   ? ?  ?Shoulder Instructions   ? ? ?  ?General Comments no adverse reaction to therapy, secretions managed throughout by SLP and RN. PMV donned with cuff deflated.  ? ? ?Pertinent Vitals/ Pain       Pain Assessment ?Pain Assessment: Faces ?Faces Pain Scale: Hurts a little bit ?Pain Location: generalized ?Pain Descriptors / Indicators: Grimacing ?Pain Intervention(s): Limited activity within patient's tolerance ? ? ?Frequency ? Min 2X/week  ? ? ? ? ?  ?Progress Toward Goals ? ?OT Goals(current goals can now be found in the care plan section) ? Progress towards  OT goals: Progressing toward goals ? ?Acute Rehab OT Goals ?OT Goal Formulation: Patient unable to participate in goal setting ?Time For Goal Achievement: 03/24/22 ?Potential to Achieve Goals: Good ?ADL Goals ?Pt Will Perform Grooming: with min assist ?Pt Will Perform Upper Body Bathing: with min assist ?Pt Will Perform Upper Body Dressing: with min assist ?Pt Will Transfer to Toilet: squat pivot transfer;with max assist  ?Plan Discharge plan remains appropriate   ? ?Co-evaluation ? ? ? PT/OT/SLP Co-Evaluation/Treatment: Yes ?Reason for Co-Treatment: To address functional/ADL transfers;Complexity of the patient's impairments (multi-system involvement);For patient/therapist safety ?  ?  ?  ? ?  ?AM-PAC OT "6 Clicks" Daily Activity     ?Outcome Measure ? ? Help from another person eating meals?: Total ?Help from another person taking care of personal grooming?: Total ?Help from another person toileting, which includes using toliet, bedpan, or urinal?: Total ?Help from another person bathing (including washing, rinsing, drying)?: Total ?Help from another person to put on and taking off regular upper body clothing?: Total ?Help from another person to put on and taking off regular lower body clothing?: Total ?6 Click Score: 6 ? ?  ?End of Session Equipment Utilized During Treatment: Oxygen ? ?OT Visit Diagnosis: Unsteadiness on feet (R26.81);Other  abnormalities of gait and mobility (R26.89);Muscle weakness (generalized) (M62.81);Low vision, both eyes (H54.2);Other symptoms and signs involving the nervous system (R29.898);Other symptoms and signs involving cognitive function;Cognitive communication deficit (R41.841);Hemiplegia and hemiparesis ?Symptoms and signs involving cognitive functions: Cerebral infarction ?Hemiplegia - Right/Left: Right ?Hemiplegia - dominant/non-dominant: Dominant ?Hemiplegia - caused by: Cerebral infarction ?  ?Activity Tolerance Patient limited by lethargy;Patient limited by fatigue;Other  (comment) ?  ?Patient Left in bed;with call bell/phone within reach;with bed alarm set ?  ?Nurse Communication Mobility status;Other (comment) ?  ? ?   ? ?Time: 1020-1051 ?OT Time Calculation (min): 31 min ? ?Charges: OT Treat

## 2022-03-16 NOTE — Procedures (Signed)
Tracheostomy Change Note ? ?Patient Details:   ?Name: Todd Estrada ?DOB: 25-Jul-1961 ?MRN: [id number] ?   ?Airway Documentation: ?   ? ?Evaluation ? O2 sats: stable throughout ?Complications: No apparent complications ?Patient did tolerate procedure well. ?Bilateral Breath Sounds: Rhonchi ?  ? ?Pt's trach was changed from #8 shiley flex cuffed to #6 shiley flex cuffless. Positive color change noted after insertion. No bleeding or complications with change. #6 shiley flex cuffed trach is at bedside as back up supplies. #6 shiley flex cuffless is on back order at this time and CCM was made aware. Pt is stable at this time. Rt will monitor.  ? ?Ronaldo Miyamoto ?03/16/2022, 11:29 AM ?

## 2022-03-16 NOTE — Evaluation (Signed)
[facility]y-Muir Speaking Valve - Evaluation ?Patient Details  ?Name: Todd Estrada ?MRN: [id number] ?Date of Birth: [birth year] ? ?Today's Date: 03/16/2022 ?Time: 1000-1034 ?SLP Time Calculation (min) (ACUTE ONLY): 34 min ? ?Past Medical History:  ?Past Medical History:  ?Diagnosis Date  ? Hypertension   ? ?Past Surgical History:  ?Past Surgical History:  ?Procedure Laterality Date  ? ESOPHAGOGASTRODUODENOSCOPY (EGD) WITH PROPOFOL N/A 03/11/2022  ? Procedure: ESOPHAGOGASTRODUODENOSCOPY (EGD) WITH PROPOFOL;  Surgeon: Todd Skeans, MD;  Location: [facility];  Service: General;  Laterality: N/A;  ? IR ANGIO INTRA EXTRACRAN SEL COM CAROTID INNOMINATE UNI R MOD SED  03/02/2022  ? IR CT HEAD LTD  03/02/2022  ? IR PERCUTANEOUS ART THROMBECTOMY/INFUSION INTRACRANIAL INC DIAG ANGIO  03/02/2022  ? PEG PLACEMENT N/A 03/11/2022  ? Procedure: PERCUTANEOUS ENDOSCOPIC GASTROSTOMY (PEG) PLACEMENT;  Surgeon: Todd Skeans, MD;  Location: [facility];  Service: General;  Laterality: N/A;  ? RADIOLOGY WITH ANESTHESIA N/A 03/02/2022  ? Procedure: IR WITH ANESTHESIA;  Surgeon: Todd Bras, MD;  Location: [facility];  Service: Radiology;  Laterality: N/A;  ? 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  He was extubated following procedure, but reintubated due to respiratory failure.  Self extubated overnight 3/19, now on venturi mask.  Re-intubated on 3/21 and bronchoscopy and tracheostomy performed on 3/22. Prior CVA resulted in mild to moderate aphasia.  ? ? ?Assessment / Plan / Recommendation ? ?Clinical Impression ? Todd Estrada was seen at bedside for [facility] PMSV evaluation. Pt lethargic throughout the duration of the evaluation. At baseline, pt has residual aphasia from previous stroke in Aug 2022. SLP deflated ~5cc from patient's cuff with good tolerance. SLP donned teal PMSV after cuff  deflation. Pt expectorated secretions at the oral and tracheal levels with strong volitional cough after PMSV placement. Secretions observed as clear. Oral cavity observed as dry with white patches on lingual surface.  Vitals remained stable during PMSV placement; HR was 76, SpO2 100 and RR 22. No volitional phonations, phonated involuntary productions during coughing/sighing. Pt was able to redirect airflow to the nose and mouth. Pt followed ~2 commands during the duration of the trials (i.e., squeeze hand & put hand on bed). SLP also observed pt shake his head "no" when asked if he smoked. Following commands and answering yes/no questions are inconsistent and unreliable forms of communication for the pt at this time. PMSV removed after ~20 minutes, with continued stable vitals. Pt is scheduled to have cuffless trach placed today 03/16/2022. SLP will continue to follow pt for further PMSV trials. PMSV may be worn with staff under full supervision. ?SLP Visit Diagnosis: Aphonia (R49.1)  ?  ?SLP Assessment ? Patient needs continued Speech Lanaguage Pathology Services  ?  ?Recommendations for follow up therapy are one component of a multi-disciplinary discharge planning process, led by the attending physician.  Recommendations may be updated based on patient status, additional functional criteria and insurance authorization. ? ?Follow Up Recommendations ? Acute inpatient rehab (3hours/day) ? ?  ?Assistance Recommended at Discharge Intermittent Supervision/Assistance  ?Functional Status Assessment Patient has had a recent decline in their functional status and demonstrates the ability to make significant improvements in function in a reasonable and predictable amount of time.  ?Frequency and Duration min 2x/week  ?2 weeks ?  ? ?PMSV Trial PMSV was placed for: 20 min ?Able to redirect  subglottic air through upper airway: Yes ?Able to Attain Phonation: No attempt to phonate ?Able to Expectorate Secretions: Yes ?Level of  Secretion Expectoration with PMSV: Tracheal;Oral ?Intelligibility: Unable to assess (comment) ?Respirations During Trial: 22 ?SpO2 During Trial: 100 % ?Pulse During Trial: 78 ?Behavior: Lethargic;Poor eye contact;No attempt to communicate ?  ?Tracheostomy Tube ?    ?  ?Vent Dependency ? FiO2 (%): 35 %  ?  ?Cuff Deflation Trial Tolerated Cuff Deflation: Yes ?Length of Time for Cuff Deflation Trial: 30 min ?Behavior: Poor eye contact;Quiet  ?   ? ? ?  ?Vaughan Sine ?03/16/2022, 10:59 AM ? ?

## 2022-03-17 ENCOUNTER — Inpatient Hospital Stay (HOSPITAL_COMMUNITY): Payer: Medicaid Other

## 2022-03-17 DIAGNOSIS — I63512 Cerebral infarction due to unspecified occlusion or stenosis of left middle cerebral artery: Secondary | ICD-10-CM | POA: Diagnosis not present

## 2022-03-17 LAB — COMPREHENSIVE METABOLIC PANEL
ALT: 48 U/L — ABNORMAL HIGH (ref 0–44)
AST: 48 U/L — ABNORMAL HIGH (ref 15–41)
Albumin: 2.6 g/dL — ABNORMAL LOW (ref 3.5–5.0)
Alkaline Phosphatase: 96 U/L (ref 38–126)
Anion gap: 8 (ref 5–15)
BUN: 19 mg/dL (ref 8–23)
CO2: 31 mmol/L (ref 22–32)
Calcium: 9 mg/dL (ref 8.9–10.3)
Chloride: 101 mmol/L (ref 98–111)
Creatinine, Ser: 0.71 mg/dL (ref 0.61–1.24)
GFR, Estimated: >60 mL/min (ref >60)
Glucose, Bld: 117 mg/dL — ABNORMAL HIGH (ref 70–99)
Potassium: 4.4 mmol/L (ref 3.5–5.1)
Sodium: 140 mmol/L (ref 135–145)
Total Bilirubin: 0.6 mg/dL (ref 0.3–1.2)
Total Protein: 6.7 g/dL (ref 6.5–8.1)

## 2022-03-17 LAB — CBC WITH DIFFERENTIAL/PLATELET
Abs Immature Granulocytes: 0.05 10*3/uL (ref 0.00–0.07)
Basophils Absolute: 0.1 10*3/uL (ref 0.0–0.1)
Basophils Relative: 1 %
Eosinophils Absolute: 0.3 10*3/uL (ref 0.0–0.5)
Eosinophils Relative: 2 %
HCT: 46.1 % (ref 39.0–52.0)
Hemoglobin: 15 g/dL (ref 13.0–17.0)
Immature Granulocytes: 0 %
Lymphocytes Relative: 12 %
Lymphs Abs: 1.5 10*3/uL (ref 0.7–4.0)
MCH: 29.9 pg (ref 26.0–34.0)
MCHC: 32.5 g/dL (ref 30.0–36.0)
MCV: 91.8 fL (ref 80.0–100.0)
Monocytes Absolute: 1.1 10*3/uL — ABNORMAL HIGH (ref 0.1–1.0)
Monocytes Relative: 9 %
Neutro Abs: 9.6 10*3/uL — ABNORMAL HIGH (ref 1.7–7.7)
Neutrophils Relative %: 76 %
Platelets: 401 10*3/uL — ABNORMAL HIGH (ref 150–400)
RBC: 5.02 MIL/uL (ref 4.22–5.81)
RDW: 12.8 % (ref 11.5–15.5)
WBC: 12.5 10*3/uL — ABNORMAL HIGH (ref 4.0–10.5)
nRBC: 0 % (ref 0.0–0.2)

## 2022-03-17 LAB — GLUCOSE, CAPILLARY
Glucose-Capillary: 135 mg/dL — ABNORMAL HIGH (ref 70–99)
Glucose-Capillary: 135 mg/dL — ABNORMAL HIGH (ref 70–99)
Glucose-Capillary: 140 mg/dL — ABNORMAL HIGH (ref 70–99)
Glucose-Capillary: 124 mg/dL — ABNORMAL HIGH (ref 70–99)
Glucose-Capillary: 142 mg/dL — ABNORMAL HIGH (ref 70–99)

## 2022-03-17 IMAGING — DX DG CHEST 1V PORT
1 series · 1 of 1 positions shown · non-contrast
Comparison: [DATE].

CLINICAL DATA: Seizures, pneumonia.

EXAM:
PORTABLE CHEST 1 VIEW

[chest ap]
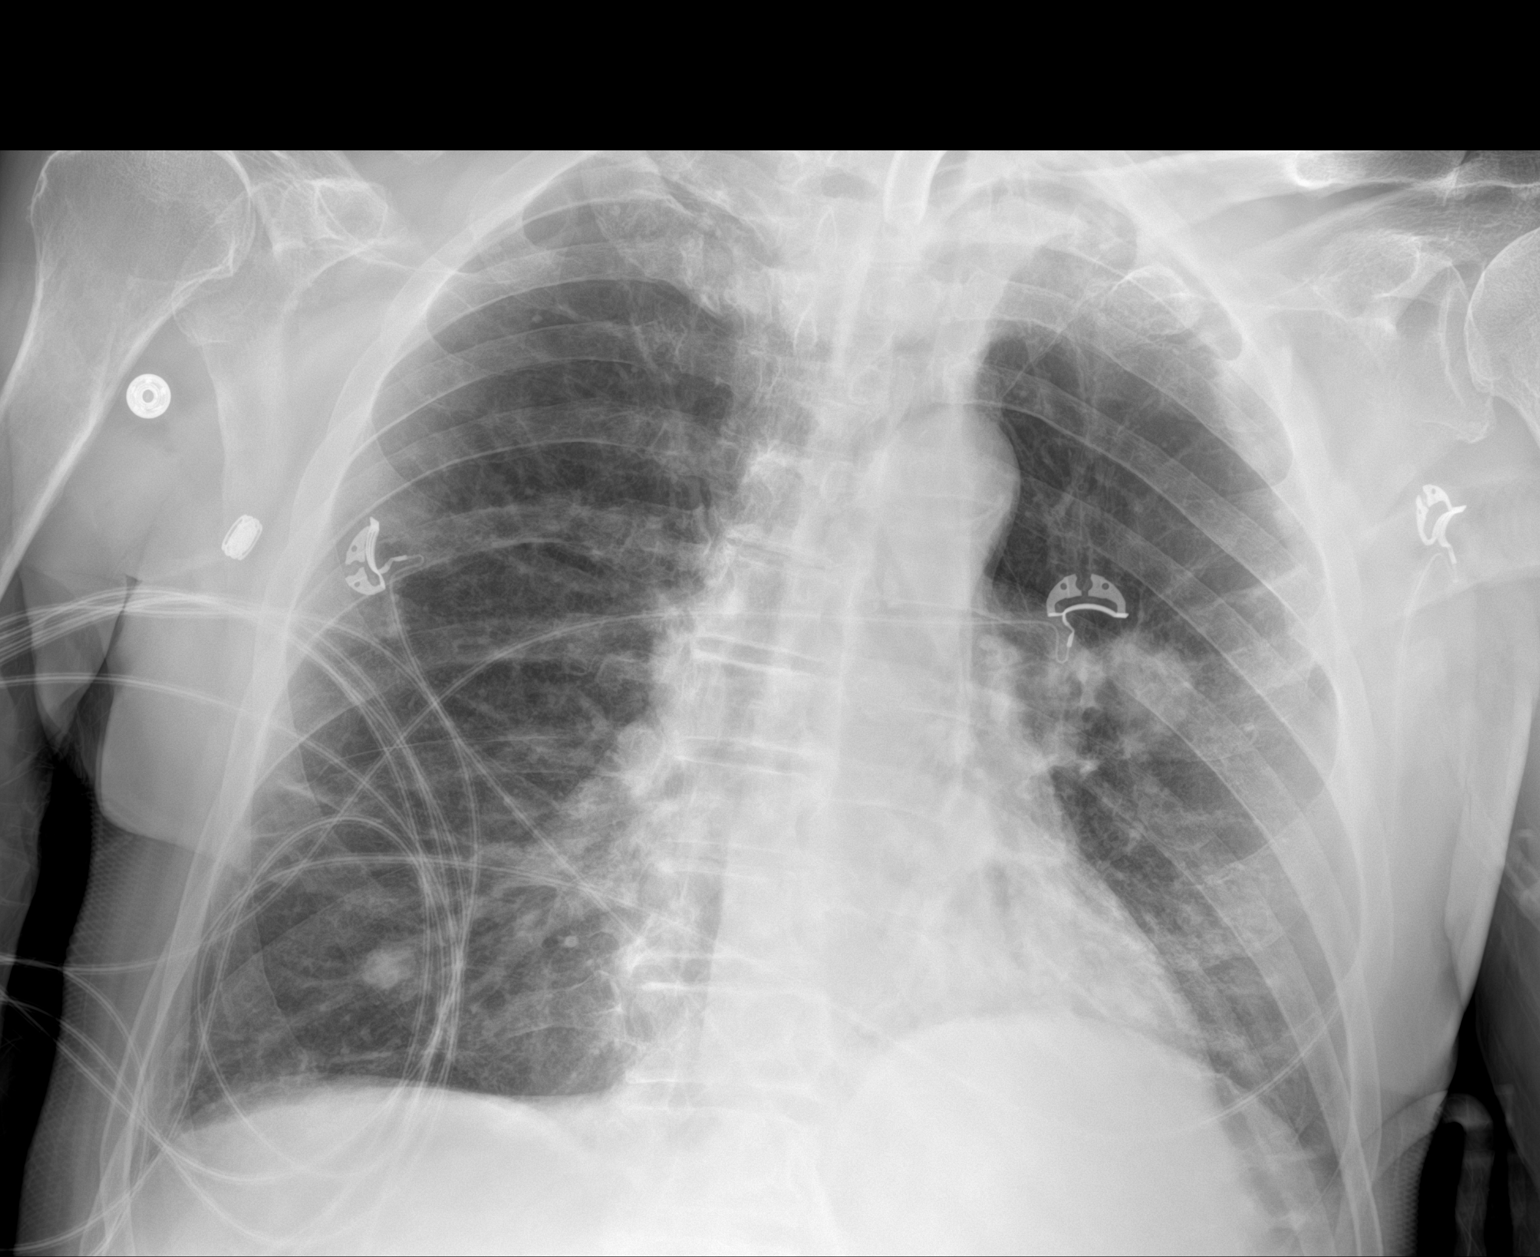

[1 of 1 positions shown; findings below may reference images not displayed]

FINDINGS: The heart size and mediastinal contours are within normal limits.
Tracheostomy tube is in good position. Rounded left perihilar
opacity is noted concerning for possible neoplasm. Possible rounded
density is noted in right lower lobe is well. The visualized
skeletal structures are unremarkable.
IMPRESSION: Rounded left perihilar density is noted as well as small right
basilar opacity. CT scan of the chest with Intravenous contrast is
recommended to rule out pulmonary nodule or mass.

## 2022-03-17 NOTE — Progress Notes (Signed)
Patient seen today by trach team for consult. No education needed at this time.  All the necessary equipment is at bedside.  Will continue to follow for progression. ?

## 2022-03-17 NOTE — Plan of Care (Signed)
Pt responds to his name and attempts to verbalize needs. Pt remains in mitt and left wrist restraint due to risk of pulling out gtube and trach. Pt also has lap restraint. Pt did manage to pull trach out during the night when he jerked his high to far right. Rapid and resp. Called trach reinserted. No issues the remainder of the night. Pt did require suctioning approx every 30 min. Tube feeding continues at 55/hr with 200 ml water flush every 12 hours. Pt had bm 3/29. Dsg to sacrum intact. Small opening noted under dsg. With redness to sacrum. Attempted to prop pt off sacrum during overnight pt turn and shifts self off pillow at times. Heels elevated. Pt vitals stable.  ?Problem: Safety: ?Goal: Non-violent Restraint(s) ?Outcome: Progressing ?  ?Problem: Education: ?Goal: Knowledge of disease or condition will improve ?Outcome: Progressing ?Goal: Knowledge of secondary prevention will improve (SELECT ALL) ?Outcome: Progressing ?Goal: Knowledge of patient specific risk factors will improve (INDIVIDUALIZE FOR PATIENT) ?Outcome: Progressing ?Goal: Individualized Educational Video(s) ?Outcome: Progressing ?  ?Problem: Coping: ?Goal: Will verbalize positive feelings about self ?Outcome: Progressing ?Goal: Will identify appropriate support needs ?Outcome: Progressing ?  ?Problem: Health Behavior/Discharge Planning: ?Goal: Ability to manage health-related needs will improve ?Outcome: Progressing ?  ?Problem: Self-Care: ?Goal: Ability to participate in self-care as condition permits will improve ?Outcome: Progressing ?Goal: Verbalization of feelings and concerns over difficulty with self-care will improve ?Outcome: Progressing ?Goal: Ability to communicate needs accurately will improve ?Outcome: Progressing ?  ?Problem: Nutrition: ?Goal: Risk of aspiration will decrease ?Outcome: Progressing ?Goal: Dietary intake will improve ?Outcome: Progressing ?  ?Problem: Ischemic Stroke/TIA Tissue Perfusion: ?Goal: Complications of  ischemic stroke/TIA will be minimized ?Outcome: Progressing ?  ?

## 2022-03-17 NOTE — Progress Notes (Signed)
Patient trach was dislodged when RN entered room.  RT reinserted #6 shiley trach; positive color change with ETCO2 detector and bilateral breath sounds auscultated. Lurline Idol re secured with trach ties and trach collar applied.   ?

## 2022-03-17 NOTE — Progress Notes (Signed)
Patient trach had to be reinserted by RT.  Patient resting.  CPT with held at this time. ?

## 2022-03-17 NOTE — Progress Notes (Signed)
ATC setup changed 

## 2022-03-17 NOTE — Progress Notes (Signed)
RN went to round on pt. Pt had jerked head and dislodged trach. Restraints still in place. Rapid called and Respiratory. Trach reinserted by respiratory. Pt maintained sats prior to reinsertion and during reinsertion. ?

## 2022-03-17 NOTE — Progress Notes (Addendum)
STROKE TEAM PROGRESS NOTE  ? ?INTERVAL HISTORY ?Patient is seen in his room with no family at the bedside.  He remains been hemodynamically stable overnight and has had no acute events.  His neurological exam slightly improved. He remains aphasic with dense right hemiplegia but follows occasional midline and a few one-step commands and he has been on trach collar now for several days and seems to be tolerating it well. Labs from this morning BMP and glucose within normal limits. Wbc 12.5. ? ?Vitals:  ? 03/17/22 0756 03/17/22 0804 03/17/22 1105 03/17/22 1135  ?BP:   116/68   ?Pulse: 80  81 80  ?Resp: (!) 25  (!) 22 (!) 24  ?Temp:   98 ?F (36.7 ?C)   ?TempSrc:   Oral   ?SpO2: 98% 98% 96% 93%  ?Weight:      ?Height:      ? ?CBC:  ?Recent Labs  ?Lab 03/14/22 ?0259 03/17/22 ?0412  ?WBC 12.7* 12.5*  ?NEUTROABS  --  9.6*  ?HGB 13.5 15.0  ?HCT 41.8 46.1  ?MCV 92.1 91.8  ?PLT 261 401*  ? ? ?Basic Metabolic Panel:  ?Recent Labs  ?Lab 03/11/22 ?0253 03/13/22 ?0709 03/16/22 ?0502 03/17/22 ?1607  ?NA 143   < > 138 140  ?K 3.7   < > 4.2 4.4  ?CL 108   < > 100 101  ?CO2 26   < > 28 31  ?GLUCOSE 87   < > 137* 117*  ?BUN 14   < > 15 19  ?CREATININE 0.73   < > 0.66 0.71  ?CALCIUM 8.4*   < > 8.8* 9.0  ?MG 2.3  --   --   --   ? < > = values in this interval not displayed.  ? ? ?Lipid Panel:  ?Recent Labs  ?Lab 03/12/22 ?0559  ?TRIG 69  ? ? ? ?IMAGING past 24 hours ?DG Abd 1 View ? ?Result Date: 03/04/2022 ?CLINICAL DATA:  Stroke Pre MRI screening EXAM: ABDOMEN - 1 VIEW COMPARISON:  None. FINDINGS: No dilated loops of bowel to indicate ileus or obstruction. Extensive atherosclerotic arterial calcifications are noted. Mild degenerative changes present lower lumbar spine. Enteric tube extensive left upper quadrant in the expected site of the stomach. No abnormal metallic densities are identified, however evaluation is limited due to multiple overlying external artifacts. IMPRESSION: No metallic density foreign body identified within the  visualized abdomen. Electronically Signed   By: Miachel Roux M.D.   On: 03/04/2022 13:32  ? ?CT HEAD WO CONTRAST (5MM) ? ?Result Date: 03/05/2022 ?CLINICAL DATA:  Stroke follow-up EXAM: CT HEAD WITHOUT CONTRAST TECHNIQUE: Contiguous axial images were obtained from the base of the skull through the vertex without intravenous contrast. RADIATION DOSE REDUCTION: This exam was performed according to the departmental dose-optimization program which includes automated exposure control, adjustment of the mA and/or kV according to patient size and/or use of iterative reconstruction technique. COMPARISON:  MRI brain 03/04/2022 FINDINGS: Brain: Subacute left MCA territory infarct with unchanged areas of cortical hypoattenuation. Encephalomalacia in the left PCA territory is unchanged. No acute hemorrhage or mass effect. Vascular: No abnormal hyperdensity of the major intracranial arteries or dural venous sinuses. No intracranial atherosclerosis. Skull: The visualized skull base, calvarium and extracranial soft tissues are normal. Sinuses/Orbits: Opacification of the maxillary and sphenoid sinuses. The orbits are normal. IMPRESSION: Subacute left MCA territory infarct without acute hemorrhage or mass effect. Electronically Signed   By: Ulyses Jarred M.D.   On: 03/05/2022 01:03  ? ?  CT HEAD WO CONTRAST ? ?Result Date: 03/02/2022 ?CLINICAL DATA:  Seizure like activity, postictal, history of alcohol abuse EXAM: CT HEAD WITHOUT CONTRAST TECHNIQUE: Contiguous axial images were obtained from the base of the skull through the vertex without intravenous contrast. RADIATION DOSE REDUCTION: This exam was performed according to the departmental dose-optimization program which includes automated exposure control, adjustment of the mA and/or kV according to patient size and/or use of iterative reconstruction technique. COMPARISON:  08/20/2021 FINDINGS: Brain: Chronic left MCA territory infarct again identified, with interval dystrophic  calcification in the infarct bed since prior study. Chronic ischemic changes are again identified throughout the periventricular and subcortical white matter, left basal ganglia, and left cerebellar hemisphere. No evidence of acute infarct or hemorrhage. Lateral ventricles and remaining midline structures are unremarkable. No acute extra-axial fluid collections. No mass effect. Vascular: No hyperdense vessel or unexpected calcification. Skull: Normal. Negative for fracture or focal lesion. Sinuses/Orbits: Chronic opacification right maxillary sinus. Stable mucosal thickening left maxillary sinus. Other: None. IMPRESSION: 1. Chronic ischemic changes as above. No acute intracranial process. Electronically Signed   By: Randa Ngo M.D.   On: 03/02/2022 19:57  ? ?CT CERVICAL SPINE WO CONTRAST ? ?Result Date: 03/02/2022 ?CLINICAL DATA:  Seizure like activity, history of alcohol abuse, neck trauma EXAM: CT CERVICAL SPINE WITHOUT CONTRAST TECHNIQUE: Multidetector CT imaging of the cervical spine was performed without intravenous contrast. Multiplanar CT image reconstructions were also generated. RADIATION DOSE REDUCTION: This exam was performed according to the departmental dose-optimization program which includes automated exposure control, adjustment of the mA and/or kV according to patient size and/or use of iterative reconstruction technique. COMPARISON:  None. FINDINGS: Alignment: Alignment is anatomic. Skull base and vertebrae: No acute fracture. No primary bone lesion or focal pathologic process. Soft tissues and spinal canal: No prevertebral fluid or swelling. No visible canal hematoma. Disc levels: Multilevel cervical spondylosis, with prominent anterior osteophyte formation from C4-5 through C6-7. Circumferential disc osteophyte complex at C3-4 and C6-7 results in right greater than left neural foraminal narrowing. Multilevel facet hypertrophy greatest from C4 through C6. Upper chest: Central airways patent.   Severe upper lobe emphysema. Other: Reconstructed images demonstrate no additional findings. IMPRESSION: 1. No acute cervical spine fracture. 2. Multilevel cervical spondylosis and facet hypertrophy as above. 3. Emphysema. Electronically Signed   By: Randa Ngo M.D.   On: 03/02/2022 20:00  ? ?MR ANGIO HEAD WO CONTRAST ? ?Addendum Date: 03/04/2022   ?ADDENDUM REPORT: 03/04/2022 17:39 ADDENDUM: Findings discussed with Dr. Leonel Ramsay via telephone at 5:35 p.m. Electronically Signed   By: Margaretha Sheffield M.D.   On: 03/04/2022 17:39  ? ?Result Date: 03/04/2022 ?CLINICAL DATA:  Neuro deficit, acute, stroke suspected; Stroke, follow up EXAM: MRI HEAD WITHOUT CONTRAST MRA HEAD WITHOUT CONTRAST TECHNIQUE: Multiplanar, multi-echo pulse sequences of the brain and surrounding structures were acquired without intravenous contrast. Angiographic images of the Circle of Willis were acquired using MRA technique without intravenous contrast. COMPARISON:  No pertinent prior exam. FINDINGS: MRI HEAD FINDINGS Brain: Acute left MCA territory infarcts predominantly involving the left frontal, parietal and temporal lobes. Infarcts involve both cortex and white matter. Small acute infarct involving the left insula. Associated edema and sulcal effacement without midline shift. Areas of susceptibility artifact and T2 hypointensity. Areas of T2 hypointensity, T1 hyperintensity and susceptibility artifact is concerning for acute hemorrhage although an element prior hemorrhage/cortical laminar necrosis is possible posteriorly. No hydrocephalus. Vascular: Major arterial flow voids are maintained at the skull base. Skull and upper cervical spine:  Normal marrow signal. Sinuses/Orbits: Paranasal sinus mucosal thickening. No acute orbital findings. Other: Small left greater than right mastoid effusions. MRA HEAD FINDINGS Anterior circulation: The intracranial ICAs are now patent. Left ICAs attenuated, likely due to pre-existing atherosclerosis  with probably moderate to severe paraclinoid ICA stenosis. Bilateral MCAs and ACAs are patent without proximal hemodynamically significant stenosis. Posterior circulation: Chronically occluded left intradural v

## 2022-03-17 NOTE — Progress Notes (Signed)
?PROGRESS NOTE ? ? ?Todd Estrada  YQM:[id number] DOB: 07/10/1961 DOA: 03/02/2022 ?PCP: Pcp, No  ?Brief Narrative:  ?61 year old white male community dwelling ?Prior left MCA/CVA + petechial hemorrhage, chronic combined CHF ?Known history of stage II pressure injuries ?HTN HLD?  EtOH ? ?Admit 03/02/2022 seizure-like activity + aphasia CTA head neck = occlusion ICA-->thrombectomy with TICI 2 results-extubated transferred to neuro ICU ?Placed on Keppra ?CCM evaluated patient secondary to unresponsiveness, aspiration ?Underwent tracheostomy 03/09/2022 with plans to change to cuffless trach 3/29 ? ?3/15 admitted for acute L MCA stroke due to L ICA occlusion with first time seizure s/p revascularization of L ICA occlusion ?3/16 Intubated for airway protection; started unasyn; echo LVEF 40-45%, RVSP normal ?3/17 MRI Brain showed acute infarct in left frontal, temporal and parietal lobes with findings worrisome for acute infarct, left vert occluded; CT head did not show hemorrhage; respiratory notes thick secretions,  ?3/18 following some commands on exam, extubated the re-intubated due to hypoxemia, inability to clear secretions ?3/19 self extubated overnight, remains on NRB ?3/21 required reintubation for inability to protect airway and aspiration  ?3/22 bedside perc tracheostomy with bronchoscopy  ?3/24 PEG ?3/26 started on trach collar ? ?Hospital-Problem based course ? ?Encephalopathy 2/2 left MCA stroke + occlusion/thrombectomy 3/15 ?Agitated delirium 3/19 ?Seizure on admission ?He was transiently on Precedex 3/19- still aphasic and requiring restraints as he pulls at his condom catheter and lines ?Continue Keppra 500 twice daily as per neurology ?Continue GDMT atorvastatin 40+ ASA 81+ Plavix 75 hydralazine 10 to 40 mg every 4 as needed systolic >889 ?Septic shock  2/2 Aspiration PNA ?Hypoxic respiratory failure  ? + trach ?Significant dysphagia status post PEG tube placement 3/24 [gen surgery] ?Nodular density L  side? ?Currently on tube feeds at 55, continue free water 200 every 12 ?We will CT his chest in am given leukocytosis and inability ?Presumed COPD + tobacco ~his stroke ?Nicotine patch--Brovana 15 twice daily Pulmicort 0.5 twice daily Yupelri 175 daily hypertonic saline twice daily per respiratory and Robitussin ?Systolic diastolic heart failure ?Prior to admission not on goal-directed therapy?  Not on ACE?Eventually resume metoprolol 12.5 twice daily already on aspirin ?EF this admission = 40-45% ?A1c 5.4 ?do not aggressively control A1c below 6.0-needs follow-up in the outpatient setting for this-current trends 124-140 ?Right upper extremity thrombophlebitis ?Stage II pressure injury from prior to admission ? ?DVT prophylaxis:  ?Code Status:  ?Family Communication: d/w Mr. Micha Dosanjh [phone] @ 03/16/22 ?Disposition:  ?Status is: Inpatient ?Remains inpatient appropriate because:  ? ?need for further rehab--CIR ?  ?Consultants:  ?neurology ? ?Procedures: multiple ? ?Antimicrobials: comp[leted  ? ? ?Subjective: ? ?More interactive in the pm ?Follows basic commands ?Does not seem to be in a whole lot of pain ?He still has very dense plegia on the right side-with Passy-Muir valve he is able to verbalize simple syllables ? ?Objective: ?Vitals:  ? 03/17/22 0804 03/17/22 1105 03/17/22 1135 03/17/22 1520  ?BP:  116/68    ?Pulse:  81 80 (!) 107  ?Resp:  (!) 22 (!) 24 14  ?Temp:  98 ?F (36.7 ?C)    ?TempSrc:  Oral    ?SpO2: 98% 96% 93% 94%  ?Weight:      ?Height:      ? ? ?Intake/Output Summary (Last 24 hours) at 03/17/2022 1718 ?Last data filed at 03/17/2022 1628 ?Gross per 24 hour  ?Intake 1157 ml  ?Output 900 ml  ?Net 257 ml  ? ? ?Filed Weights  ? 03/14/22 0500 03/15/22 0500  03/17/22 0500  ?Weight: 79.6 kg 79 kg 81.5 kg  ? ? ?Examination: ? ?Trach collar in place , 6 Shiley uncuffed ?No ict no pallor ?Cta B ?Peg in place ?Dense deficit to the right upper extremity with inability to follow commands with some hemineglect  on the right side ?Can raise both legs off the bed ?Does follow commands ?Speech is garbled ? ?Data Reviewed: personally reviewed  ? ?CBC ?   ?Component Value Date/Time  ? WBC 12.5 (H) 03/17/2022 0412  ? RBC 5.02 03/17/2022 0412  ? HGB 15.0 03/17/2022 0412  ? HCT 46.1 03/17/2022 0412  ? PLT 401 (H) 03/17/2022 0412  ? MCV 91.8 03/17/2022 0412  ? MCH 29.9 03/17/2022 0412  ? MCHC 32.5 03/17/2022 0412  ? RDW 12.8 03/17/2022 0412  ? LYMPHSABS 1.5 03/17/2022 0412  ? MONOABS 1.1 (H) 03/17/2022 6759  ? EOSABS 0.3 03/17/2022 0412  ? BASOSABS 0.1 03/17/2022 0412  ? ? ?  Latest Ref Rng & Units 03/17/2022  ?  4:12 AM 03/16/2022  ?  5:02 AM 03/15/2022  ?  4:28 AM  ?CMP  ?Glucose 70 - 99 mg/dL 117   137   138    ?BUN 8 - 23 mg/dL _0 ?Creatinine 0.61 - 1.24 mg/dL 0.71   0.66   0.60    ?Sodium 135 - 145 mmol/L 140   138   139    ?Potassium 3.5 - 5.1 mmol/L 4.4   4.2   4.3    ?Chloride 98 - 111 mmol/L 101   100   103    ?CO2 22 - 32 mmol/L _1 ?Calcium 8.9 - 10.3 mg/dL 9.0   8.8   8.7    ?Total Protein 6.5 - 8.1 g/dL 6.7      ?Total Bilirubin 0.3 - 1.2 mg/dL 0.6      ?Alkaline Phos 38 - 126 U/L 96      ?AST 15 - 41 U/L 48      ?ALT 0 - 44 U/L 48      ? ? ? ?Radiology Studies: ?DG CHEST PORT 1 VIEW ? ?Result Date: 03/17/2022 ?CLINICAL DATA:  Seizures, pneumonia. EXAM: PORTABLE CHEST 1 VIEW COMPARISON:  March 09, 2022. FINDINGS: The heart size and mediastinal contours are within normal limits. Tracheostomy tube is in good position. Rounded left perihilar opacity is noted concerning for possible neoplasm. Possible rounded density is noted in right lower lobe is well. The visualized skeletal structures are unremarkable. IMPRESSION: Rounded left perihilar density is noted as well as small right basilar opacity. CT scan of the chest with Intravenous contrast is recommended to rule out pulmonary nodule or mass. Electronically Signed   By: Marijo Conception M.D.   On: 03/17/2022 08:13   ? ? ?Scheduled Meds: ? arformoterol   15 mcg Nebulization BID  ? aspirin  81 mg Per Tube Daily  ? atorvastatin  40 mg Per Tube Daily  ? budesonide (PULMICORT) nebulizer solution  0.5 mg Nebulization BID  ? chlorhexidine gluconate (MEDLINE KIT)  15 mL Mouth Rinse BID  ? Chlorhexidine Gluconate Cloth  6 each Topical Q0600  ? clopidogrel  75 mg Per Tube Daily  ? docusate  100 mg Per Tube BID  ? enoxaparin (LOVENOX) injection  40 mg Subcutaneous Q24H  ? feeding supplement (PROSource TF)  90 mL Per Tube BID  ? folic acid  1 mg  Per Tube Daily  ? free water  200 mL Per Tube Q12H  ? guaiFENesin  15 mL Per Tube Q6H  ? levETIRAcetam  500 mg Per Tube BID  ? mouth rinse  15 mL Mouth Rinse QID  ? multivitamin with minerals  1 tablet Per Tube Daily  ? nicotine  21 mg Transdermal Daily  ? pantoprazole sodium  40 mg Per Tube Q24H  ? revefenacin  175 mcg Nebulization Daily  ? sodium chloride HYPERTONIC  4 mL Nebulization BID  ? thiamine  100 mg Per Tube Daily  ? ?Continuous Infusions: ? sodium chloride 10 mL/hr at 03/14/22 0600  ? sodium chloride Stopped (03/15/22 0904)  ? feeding supplement (OSMOLITE 1.5 CAL) 1,000 mL (03/17/22 0546)  ? ? ? LOS: 15 days  ? ?Time spent: 37 minutes ? ?Nita Sells, MD ?[facility] ?To contact the attending provider between 7A-7P or the covering provider during after hours 7P-7A, please log into the web site [email or web address] and access using universal [facility] password for that web site. If you do not have the password, please call the hospital operator. ? ?03/17/2022, 5:18 PM  ? ? ?

## 2022-03-17 NOTE — Progress Notes (Signed)
Inpatient Rehab Admissions Coordinator:  ? ?Met with patient at bedside to discuss CIR.  Some vocalizations with PMSV.  Agrees he does need some rehab. I reviewed CIR recommendations/expectations/goals.  We talked about 3 hrs/day of therapy, MD following, and average length of stay 2 weeks.  I shared that he would likely not be safe to be alone at discharge and he reports his cousin can come live with him.  I will call cousin to discuss CIR and confirm caregiver support.  ? ?Shann Medal, PT, DPT ?Admissions Coordinator ?[phone] ?03/17/22  ?1:26 PM ? ?

## 2022-03-17 NOTE — Progress Notes (Signed)
Physical Therapy Treatment ?Patient Details ?Name: Todd Estrada ?MRN: [id number] ?DOB: 1961/10/23 ?Today's Date: 03/17/2022 ? ? ?History of Present Illness 61 y/o male with a history of Left MCA stroke in 1497 with a complicated post stroke course presented on 3/15 with a 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  He was extubated following procedure, but reintubated due to respiratory failure.  Self extubated overnight 3/19, now on venturi mask.  Re-intubated on 3/21 and bronchoscopy and tracheostomy performed on 3/22. ? ?  ?PT Comments  ? ? Pt making steady progress toward goals limited by R UE>LE dysfunction.  Emphasis on transition to EOB, sitting blance, sit to stands with pre-gait activity/standing balance x3 and pt initiated ambulation 47feet x2 with significant 2 person mod/max assist for w/shift and swing to pattern on the right. ?   ?Recommendations for follow up therapy are one component of a multi-disciplinary discharge planning process, led by the attending physician.  Recommendations may be updated based on patient status, additional functional criteria and insurance authorization. ? ?Follow Up Recommendations ? Acute inpatient rehab (3hours/day) ?  ?  ?Assistance Recommended at Discharge Frequent or constant Supervision/Assistance  ?Patient can return home with the following A lot of help with bathing/dressing/bathroom;Direct supervision/assist for medications management;Assist for transportation;Help with stairs or ramp for entrance;Two people to help with walking and/or transfers ?  ?Equipment Recommendations ? Wheelchair (measurements PT);Wheelchair cushion (measurements PT);Hospital bed  ?  ?Recommendations for Other Services Rehab consult ? ? ?  ?Precautions / Restrictions Precautions ?Precautions: Fall ?Precaution Comments: posey belt, wrist restraint on L, R inattention, trach collar 28%%FiO2  ?  ? ?Mobility ? Bed Mobility ?Overal bed mobility: Needs Assistance ?Bed  Mobility: Supine to Sit, Rolling, Sit to Supine ?Rolling: Mod assist ?  ?Supine to sit: Mod assist, +2 for physical assistance ?Sit to supine: Mod assist, Max assist, +2 for physical assistance ?  ?General bed mobility comments: cued for direction, assisted with R UE and truncal assist up and over via L elbow to sitting.  Pt rocked to scoot R side forward then symmetrical scoot to EOB with minimal assist. ?  ? ?Transfers ?Overall transfer level: Needs assistance ?  ?Transfers: Sit to/from Stand ?Sit to Stand: Min assist, Mod assist ?  ?  ?  ?  ?  ?General transfer comment: cued to use L hand on bed to push up.  light moderate assist forward and up to a submaximal posture. last sit to stand with +2 min assist. ?  ? ?Ambulation/Gait ?Ambulation/Gait assistance: Mod assist, +2 physical assistance ?Gait Distance (Feet): 8 Feet (x2) ?Assistive device: 2 person hand held assist ?Gait Pattern/deviations: Step-to pattern, Decreased step length - right, Decreased step length - left, Decreased stance time - right, Decreased stride length ?  ?Gait velocity interpretation: <1.31 ft/sec, indicative of household ambulator ?  ?General Gait Details: If no attempt at control and quality, pt will slide R foot along behind the left paretically, can maintain flexed knee w/bearing on the right to advance L LE.  It was difficult to get pt to slow down for R toe off and heel contact due to heavy R LE co contraction with swing through including R stance and bringing R pelvis through during the L swing through. ? ? ?Stairs ?  ?  ?  ?  ?  ? ? ?Wheelchair Mobility ?  ? ?Modified Rankin (Stroke Patients Only) ?Modified Rankin (Stroke Patients Only) ?Pre-Morbid Rankin Score: Slight disability ?Modified Rankin: Moderately severe  disability ? ? ?  ?Balance Overall balance assessment: Needs assistance ?Sitting-balance support: Feet supported ?Sitting balance-Leahy Scale: Fair (to poor.) ?Sitting balance - Comments: Is able to sit at EOB with UE  assist, but will alternate with L UE assist at times. ?  ?Standing balance support: Bilateral upper extremity supported, During functional activity ?Standing balance-Leahy Scale: Poor ?Standing balance comment: min 1-2 persons at EOB with L hand support on chair back.  working on balance, upright posture and w/shifting with R knee control. ?  ?  ?  ?  ?  ?  ?  ?  ?  ?  ?  ?  ? ?  ?Cognition Arousal/Alertness: Awake/alert ?Behavior During Therapy: Restless ?Overall Cognitive Status: No family/caregiver present to determine baseline cognitive functioning ?  ?  ?  ?  ?  ?  ?  ?  ?  ?  ?  ?  ?  ?  ?  ?  ?General Comments: follows commands >50%, more with cues ?  ?  ? ?  ?Exercises   ? ?  ?General Comments General comments (skin integrity, edema, etc.): vss on RA 28% fiO2 ?  ?  ? ?Pertinent Vitals/Pain Pain Assessment ?Pain Assessment: Faces ?Faces Pain Scale: No hurt ?Pain Intervention(s): Monitored during session  ? ? ?Home Living   ?  ?  ?  ?  ?  ?  ?  ?  ?  ?   ?  ?Prior Function    ?  ?  ?   ? ?PT Goals (current goals can now be found in the care plan section) Acute Rehab PT Goals ?Patient Stated Goal: unable to state ?PT Goal Formulation: Patient unable to participate in goal setting ?Time For Goal Achievement: 03/24/22 ?Potential to Achieve Goals: Fair ?Progress towards PT goals: Progressing toward goals ? ?  ?Frequency ? ? ? Min 4X/week ? ? ? ?  ?PT Plan Current plan remains appropriate  ? ? ?Co-evaluation   ?  ?  ?  ?  ? ?  ?AM-PAC PT "6 Clicks" Mobility   ?Outcome Measure ? Help needed turning from your back to your side while in a flat bed without using bedrails?: A Lot ?Help needed moving from lying on your back to sitting on the side of a flat bed without using bedrails?: Total ?Help needed moving to and from a bed to a chair (including a wheelchair)?: Total ?Help needed standing up from a chair using your arms (e.g., wheelchair or bedside chair)?: Total ?Help needed to walk in hospital room?: Total ?Help  needed climbing 3-5 steps with a railing? : Total ?6 Click Score: 7 ? ?  ?End of Session   ?Activity Tolerance: Patient tolerated treatment well ?Patient left: in bed;with restraints reapplied;with call bell/phone within reach ?Nurse Communication: Mobility status ?PT Visit Diagnosis: Other abnormalities of gait and mobility (R26.89);Other symptoms and signs involving the nervous system (R29.898);Hemiplegia and hemiparesis ?Hemiplegia - Right/Left: Right ?Hemiplegia - dominant/non-dominant: Non-dominant ?Hemiplegia - caused by: Cerebral infarction ?  ? ? ?Time: 6834-1962 ?PT Time Calculation (min) (ACUTE ONLY): 33 min ? ?Charges:  $Therapeutic Activity: 8-22 mins ?$Neuromuscular Re-education: 8-22 mins          ?          ? ?03/17/2022 ? ?Ginger Carne., PT ?Acute Rehabilitation Services ?[phone]  (pager) ?[phone]  (office) ? ? ?Tessie Fass Endora Teresi ?03/17/2022, 5:04 PM ? ?

## 2022-03-18 ENCOUNTER — Inpatient Hospital Stay (HOSPITAL_COMMUNITY): Payer: Medicaid Other

## 2022-03-18 DIAGNOSIS — I63512 Cerebral infarction due to unspecified occlusion or stenosis of left middle cerebral artery: Secondary | ICD-10-CM | POA: Diagnosis not present

## 2022-03-18 LAB — CBC WITH DIFFERENTIAL/PLATELET
Abs Immature Granulocytes: 0.05 10*3/uL (ref 0.00–0.07)
Basophils Absolute: 0.1 10*3/uL (ref 0.0–0.1)
Basophils Relative: 1 %
Eosinophils Absolute: 0.2 10*3/uL (ref 0.0–0.5)
Eosinophils Relative: 1 %
HCT: 45.9 % (ref 39.0–52.0)
Hemoglobin: 15.1 g/dL (ref 13.0–17.0)
Immature Granulocytes: 0 %
Lymphocytes Relative: 11 %
Lymphs Abs: 1.6 10*3/uL (ref 0.7–4.0)
MCH: 29.9 pg (ref 26.0–34.0)
MCHC: 32.9 g/dL (ref 30.0–36.0)
MCV: 90.9 fL (ref 80.0–100.0)
Monocytes Absolute: 1.1 10*3/uL — ABNORMAL HIGH (ref 0.1–1.0)
Monocytes Relative: 8 %
Neutro Abs: 11.1 10*3/uL — ABNORMAL HIGH (ref 1.7–7.7)
Neutrophils Relative %: 79 %
Platelets: 474 10*3/uL — ABNORMAL HIGH (ref 150–400)
RBC: 5.05 MIL/uL (ref 4.22–5.81)
RDW: 12.9 % (ref 11.5–15.5)
WBC: 14 10*3/uL — ABNORMAL HIGH (ref 4.0–10.5)
nRBC: 0 % (ref 0.0–0.2)

## 2022-03-18 LAB — GLUCOSE, CAPILLARY
Glucose-Capillary: 136 mg/dL — ABNORMAL HIGH (ref 70–99)
Glucose-Capillary: 142 mg/dL — ABNORMAL HIGH (ref 70–99)
Glucose-Capillary: 144 mg/dL — ABNORMAL HIGH (ref 70–99)
Glucose-Capillary: 147 mg/dL — ABNORMAL HIGH (ref 70–99)
Glucose-Capillary: 156 mg/dL — ABNORMAL HIGH (ref 70–99)
Glucose-Capillary: 157 mg/dL — ABNORMAL HIGH (ref 70–99)

## 2022-03-18 LAB — COMPREHENSIVE METABOLIC PANEL
ALT: 60 U/L — ABNORMAL HIGH (ref 0–44)
AST: 51 U/L — ABNORMAL HIGH (ref 15–41)
Albumin: 2.6 g/dL — ABNORMAL LOW (ref 3.5–5.0)
Alkaline Phosphatase: 105 U/L (ref 38–126)
Anion gap: 8 (ref 5–15)
BUN: 26 mg/dL — ABNORMAL HIGH (ref 8–23)
CO2: 28 mmol/L (ref 22–32)
Calcium: 8.8 mg/dL — ABNORMAL LOW (ref 8.9–10.3)
Chloride: 100 mmol/L (ref 98–111)
Creatinine, Ser: 0.81 mg/dL (ref 0.61–1.24)
GFR, Estimated: >60 mL/min (ref >60)
Glucose, Bld: 134 mg/dL — ABNORMAL HIGH (ref 70–99)
Potassium: 4.5 mmol/L (ref 3.5–5.1)
Sodium: 136 mmol/L (ref 135–145)
Total Bilirubin: 1 mg/dL (ref 0.3–1.2)
Total Protein: 6.9 g/dL (ref 6.5–8.1)

## 2022-03-18 IMAGING — CT CT CHEST W/ CM
2 of 3 series · 15 of 36 positions shown, 18 images · IV contrast (Omni 300)
Comparison: Chest radiograph [DATE]

CLINICAL DATA: Abnormal xray - lung nodule, < 1 cm, low risk

EXAM:
CT CHEST WITH CONTRAST
TECHNIQUE: Multidetector CT imaging of the chest was performed during
intravenous contrast administration.

[Series 3: chest with 2mm st · axial · 0.82mm/px · z∈[+1181,+1505]mm · 12 of 192 slices shown, 15 images]
[im 15/192  mediastinal]
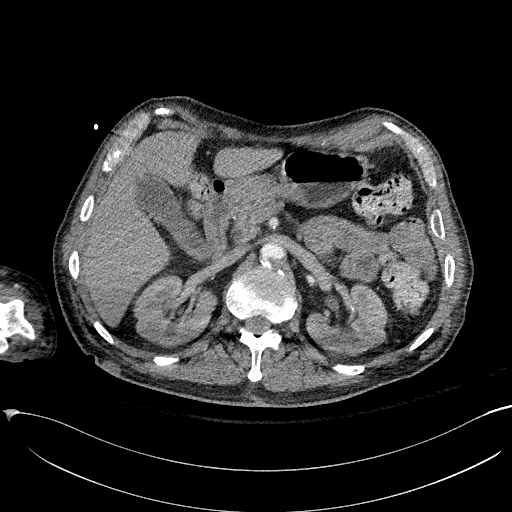
[im 15/192  lung]
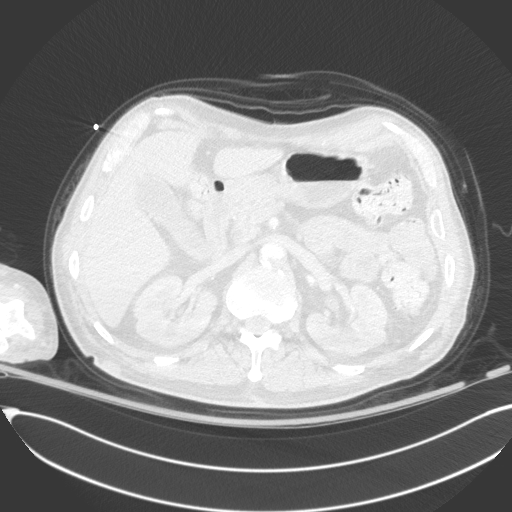
[im 29/192  lung]
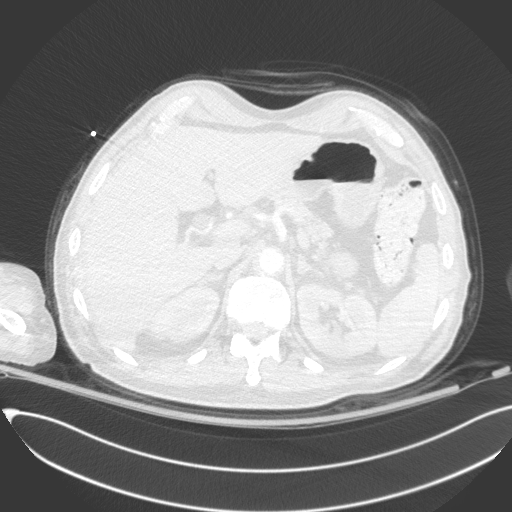
[im 43/192  lung]
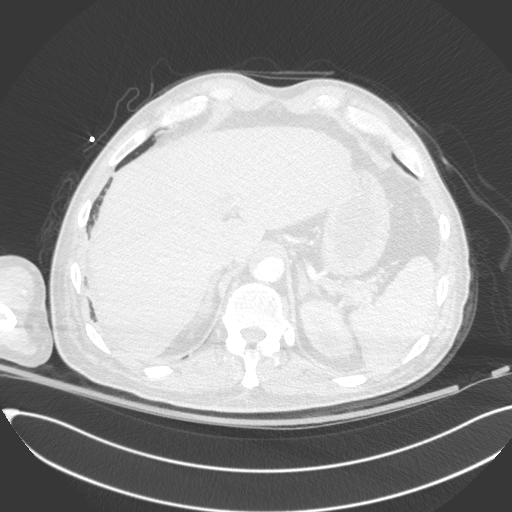
[im 57/192  lung]
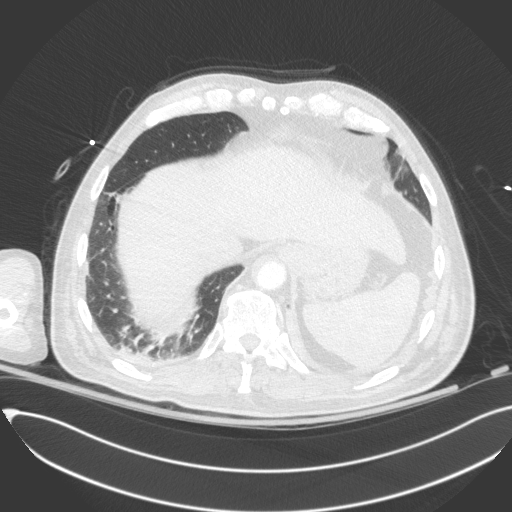
[im 71/192  mediastinal]
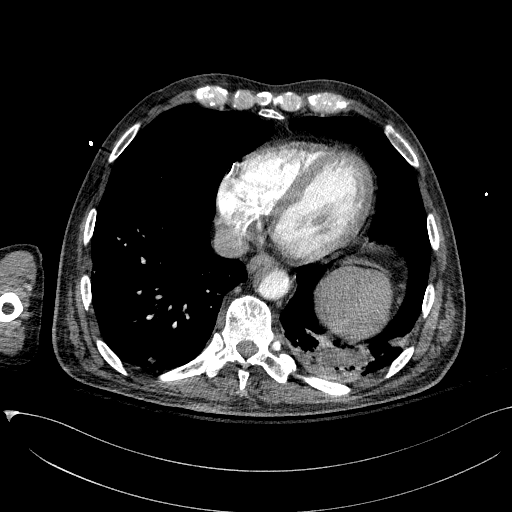
[im 71/192  lung]
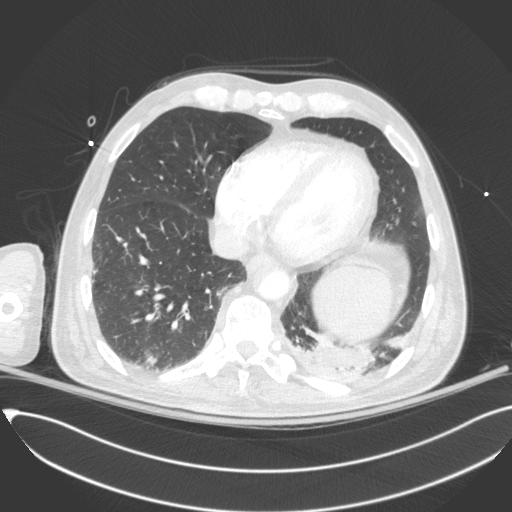
[im 85/192  lung]
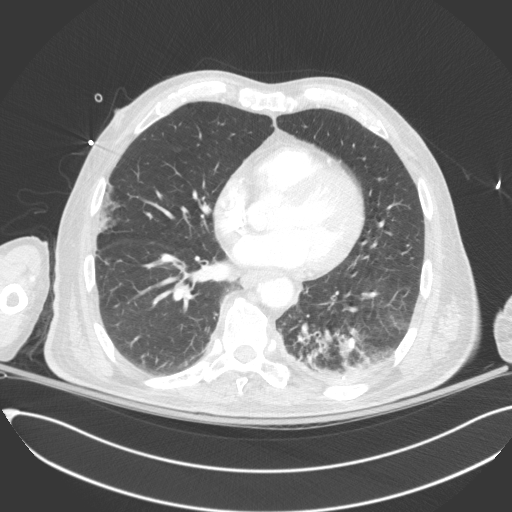
[im 107/192  lung]
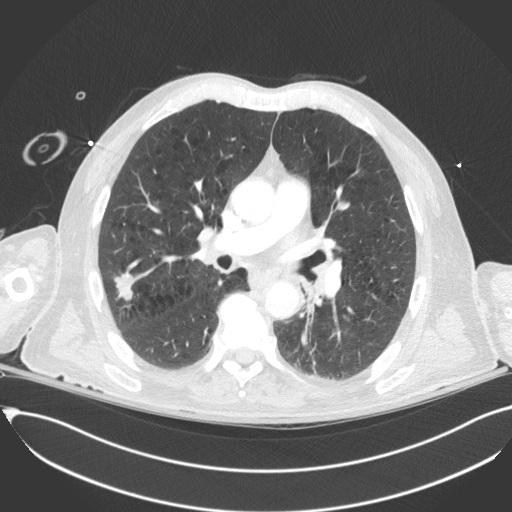
[im 121/192  lung]
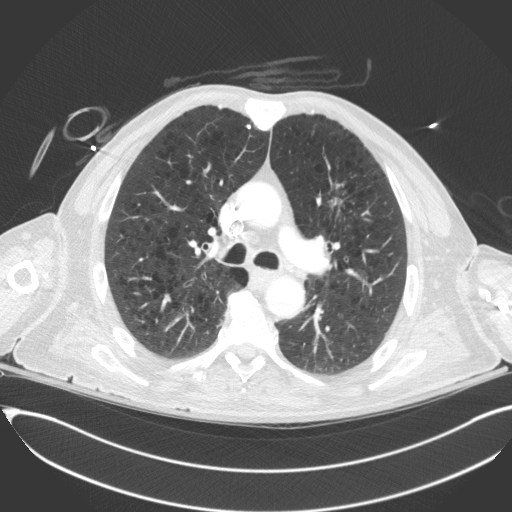
[im 135/192  mediastinal]
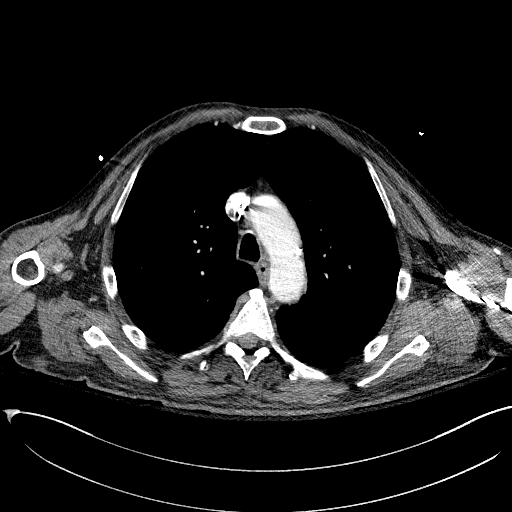
[im 135/192  lung]
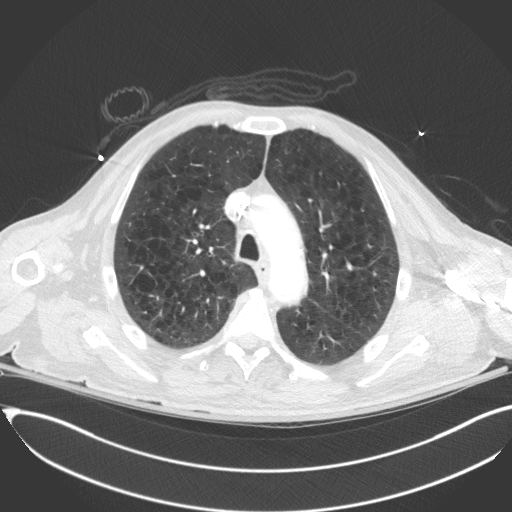
[im 149/192  lung]
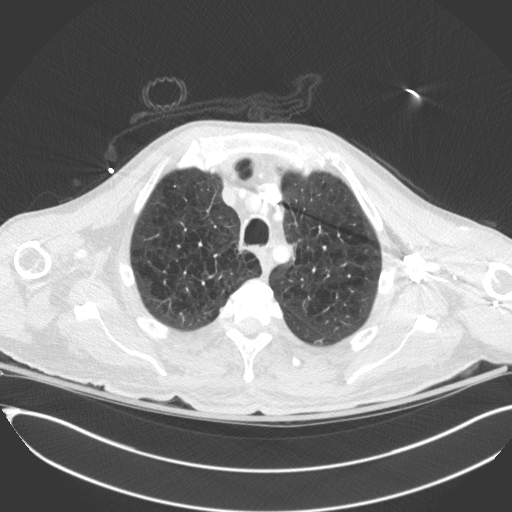
[im 163/192  lung]
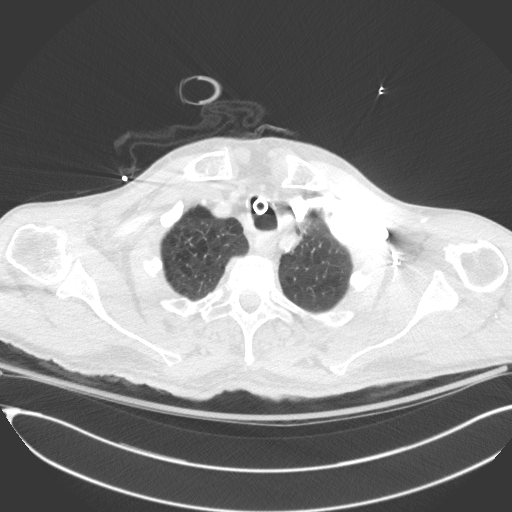
[im 177/192  lung]
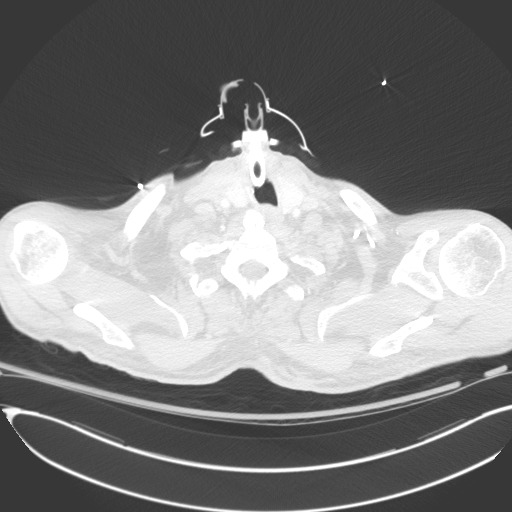

[Series 5: chest with 2mm st cor · coronal · 0.75mm/px · 3 of 150 slices shown]
[im 30/150  lung]
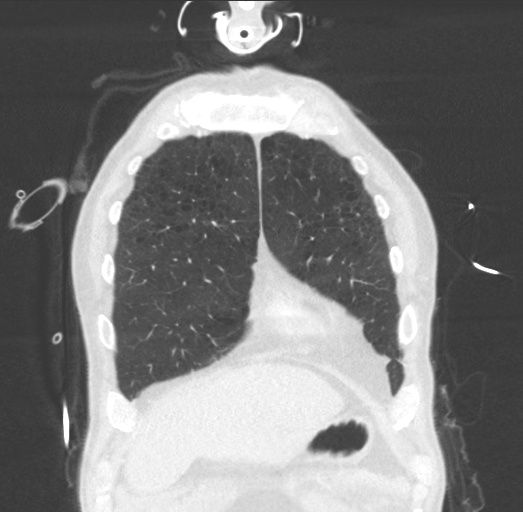
[im 60/150  lung]
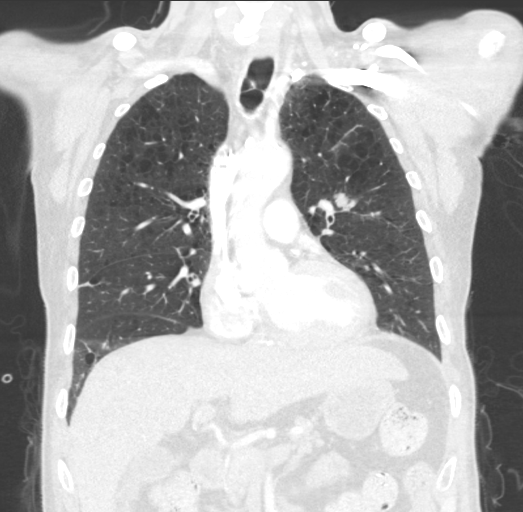
[im 90/150  lung]
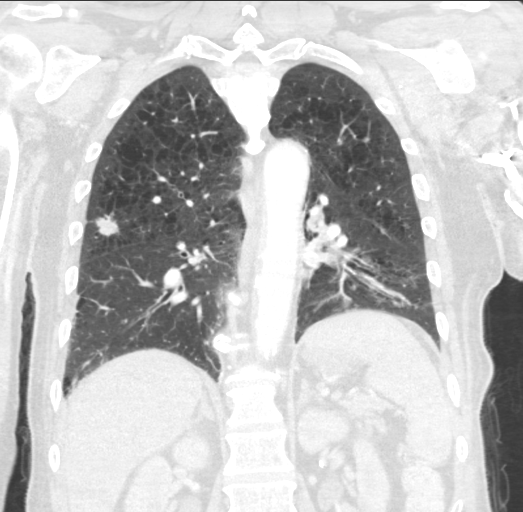

[15 of 36 positions shown; findings below may reference images not displayed]

RADIATION DOSE REDUCTION: This exam was performed according to the
departmental dose-optimization program which includes automated
exposure control, adjustment of the mA and/or kV according to
patient size and/or use of iterative reconstruction technique.

CONTRAST:  75mL OMNIPAQUE IOHEXOL 300 MG/ML  SOLN
FINDINGS: Cardiovascular: Extensive coronary artery calcification. Global
cardiac size within normal limits. No pericardial effusion. Central
pulmonary arteries are of normal caliber. Mild predominantly
atheromatous plaque within the descending thoracic aorta. No aortic
aneurysm.

Mediastinum/Nodes: Tracheostomy in expected position. Necrotic left
hilar lymph node noted measuring 14 mm in short axis diameter, axial
image # 79/3. No additional frankly pathologic thoracic adenopathy
is identified though several shotty aortopulmonary and subcarinal
lymph nodes noted. The esophagus is unremarkable.,

Lungs/Pleura: There is moderate to severe emphysema. Left basilar
atelectasis or parenchymal scarring is present. Lobulated,
spiculated mass is seen within the left upper lobe suspicious for a
primary bronchogenic neoplasm measuring 2.7 x 3.7 cm at axial image
# 76/4. An additional lobulated, spiculated nodule is seen within
the posterior segment of the right upper lobe measuring 16 x 20 mm
at axial image # 85/4 most in keeping with a synchronous primary
neoplasm. Mean 13 mm rounded nonspecific nodule is seen within the
left lower lobe, axial image # 114/4. Tubular opacity within the
subpleural right lower lobe at axial image # 127/4 is most in
keeping with an impacted airway. No pneumothorax or pleural
effusion.

Upper Abdomen: Gastrostomy catheter is partially visualized. No
acute abnormality.

Musculoskeletal: No lytic or blastic bone lesions. No acute bone
abnormality.
IMPRESSION: Bilateral upper lobe lobulated/spiculated masses suspicious for
synchronous bronchogenic neoplasm. PET CT examination is recommended
for further evaluation. Associated left hilar pathologic adenopathy.

13 mm indeterminate rounded nodule within the left lower lobe.
Further follow-up will be dictated by surveillance imaging for
above-mentioned pulmonary masses.

Moderate to severe emphysema.

Extensive coronary artery calcification.

Aortic Atherosclerosis ([IW]-[IW]) and Emphysema ([IW]-[IW]).

## 2022-03-18 MED ORDER — QUETIAPINE FUMARATE 25 MG PO TABS
25.0000 mg | ORAL_TABLET | Freq: Every day | ORAL | Status: DC
  Administered 2022-03-18 – 2022-03-21 (×4): 25 mg via ORAL
  Filled 2022-03-18 (×4): qty 1

## 2022-03-18 MED ORDER — GLYCOPYRROLATE 1 MG PO TABS
1.0000 mg | ORAL_TABLET | Freq: Two times a day (BID) | ORAL | Status: DC
  Administered 2022-03-18 – 2022-03-22 (×9): 1 mg via ORAL
  Filled 2022-03-18 (×11): qty 1

## 2022-03-18 MED ORDER — NICOTINE 14 MG/24HR TD PT24
14.0000 mg | MEDICATED_PATCH | Freq: Every day | TRANSDERMAL | Status: DC
  Administered 2022-03-18 – 2022-03-22 (×5): 14 mg via TRANSDERMAL
  Filled 2022-03-18 (×5): qty 1

## 2022-03-18 MED ORDER — IOHEXOL 300 MG/ML  SOLN
75.0000 mL | Freq: Once | INTRAMUSCULAR | Status: AC | PRN
  Administered 2022-03-18: 75 mL via INTRAVENOUS

## 2022-03-18 MED ORDER — FREE WATER
200.0000 mL | Freq: Four times a day (QID) | Status: DC
  Administered 2022-03-18 – 2022-03-22 (×16): 200 mL

## 2022-03-18 NOTE — Progress Notes (Deleted)
? ?NAME:  Todd Estrada, MRN:  [id number], DOB:  January 04, 1961, LOS: 36 ?ADMISSION DATE:  03/02/2022, CONSULTATION DATE:  3/16 ?REFERRING MD:  Erlinda Hong, CHIEF COMPLAINT:  Confusion, seizure  ? ?History of Present Illness:  ?61 y/o male with a history of Left MCA stroke in 5462 with a complicated post stroke course presented on 3/15 with a 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  Intubated on 3/16 for airway protection. ? ?Pertinent  Medical History  ?Hyeprtension ?Left MCA stroke ?Chronic combined CHF ? ?Significant Hospital Events: ?Including procedures, antibiotic start and stop dates in addition to other pertinent events   ?3/15 admitted for acute L MCA stroke due to L ICA occlusion with first time seizure s/p revascularization of L ICA occlusion ?3/16 Intubated for airway protection; started unasyn; echo LVEF 40-45%, RVSP normal ?3/17 MRI Brain showed acute infarct in left frontal, temporal and parietal lobes with findings worrisome for acute infarct, left vert occluded; CT head did not show hemorrhage; respiratory notes thick secretions,  ?3/18 following some commands on exam, extubated the re-intubated due to hypoxemia, inability to clear secretions ?3/19 self extubated overnight, remains on NRB ?3/21 required reintubation for inability to protect airway and aspiration  ?3/22 bedside perc tracheostomy with bronchoscopy  ?3/24 PEG ?3/26 started on trach collar ? ?Interim History / Subjective:  ?Patient has been tolerating trach collar. He is more awake and interactive today. Following commands consistently.  ? ?Objective   ?Blood pressure 110/71, pulse 88, temperature 99.8 ?F (37.7 ?C), temperature source Oral, resp. rate 20, height 6' (1.829 m), weight 81.5 kg, SpO2 93 %. ?   ?FiO2 (%):  [28 %-35 %] 35 %  ? ?Intake/Output Summary (Last 24 hours) at 03/18/2022 0934 ?Last data filed at 03/18/2022 0500 ?Gross per 24 hour  ?Intake 1356 ml  ?Output 1900 ml  ?Net -544 ml  ? ?Filed Weights  ? 03/14/22  0500 03/15/22 0500 03/17/22 0500  ?Weight: 79.6 kg 79 kg 81.5 kg  ? ? ?Examination: ?General:  chronically ill appearing unkept elderly male, Resting comfortably in bed ?HENT: NCAT OP clear tracheostomy in place with thick secretions  ?PULM: diffuse crackles; no wheezing. normal effort ?CV: RRR, no mgr ?GI: BS+, soft, nontender ?MSK: normal bulk and tone ?Neuro: awake, alert, moves left arm/leg; follows commands; left gaze preference; continued right hemineglect ? ?Resolved Hospital Problem list   ?Septic shock due to aspiration pneumonia> resolved ?Constipation ? ?Assessment & Plan:  ?Acute encephalopathy due to left MCA stroke from left ICA occlusion requiring thrombectomy and stent placement on 3/15 ?Seizure disorder ?Agitated delirium on 3/19> risk to self pulling at tubes, lines ?Continue Keppra  ?Secondary stroke prevention per neurology/stroke service ?BP goals and glucose goals per stroke service ?Continue statin and dual antiplatelet agent per stroke service ?Imaging per stroke service ?PT/OT/SLP ? ?Acute hypoxemic respiratory failure due to aspiration pneumonia s/p tracheostomy > improving ?Thick pulmonary secretions ?Concern for emphysema/COPD > not in history but exam supports ?Patient has been tolerating trach collar for 48 hours as of this morning. Able to generate cough to clear secretions. Can transfer out of ICU ?- Trach care per routine ?- Continue chest PT ?- Change to cuffless trach 3/29 ?- Continue supplemental oxygen ?- Continue Brovana, pulmicort, yupelri  ?- F/u trach culture  ? ?Combined systolic and diastolic heart failure, chronic ?Hypertension ?Monitor hemodynamics ?Hydralazine prn SBP > 160 ? ?RUE thrombophlebitis ? ? ?Best Practice (right click and "Reselect all SmartList Selections" daily)  ? ?Diet/type:  tubefeeds ?DVT prophylaxis: LMWH ?GI prophylaxis: PPI ?Lines: N/A ?Foley:  N/A ?Code Status:  full code ?Last date of multidisciplinary goals of care discussion [per stroke  service] ? ?Labs   ?CBC: ?Recent Labs  ?Lab 03/12/22 ?0559 03/13/22 ?2426 03/14/22 ?0259 03/17/22 ?8341 03/18/22 ?9622  ?WBC 10.5 11.3* 12.7* 12.5* 14.0*  ?NEUTROABS  --   --   --  9.6* 11.1*  ?HGB 13.5 12.7* 13.5 15.0 15.1  ?HCT 40.9 39.3 41.8 46.1 45.9  ?MCV 92.1 92.7 92.1 91.8 90.9  ?PLT 200 222 261 401* 474*  ? ? ?Basic Metabolic Panel: ?Recent Labs  ?Lab 03/14/22 ?0259 03/15/22 ?0428 03/16/22 ?0502 03/17/22 ?2979 03/18/22 ?8921  ?NA 138 139 138 140 136  ?K 3.7 4.3 4.2 4.4 4.5  ?CL 105 103 100 101 100  ?CO2 29 28 28 31 28   ?GLUCOSE 122* 138* 137* 117* 134*  ?BUN 11 14 15 19  26*  ?CREATININE 0.62 0.60* 0.66 0.71 0.81  ?CALCIUM 8.2* 8.7* 8.8* 9.0 8.8*  ? ?GFR: ?Estimated Creatinine Clearance: 105.1 mL/min (by C-G formula based on SCr of 0.81 mg/dL). ?Recent Labs  ?Lab 03/13/22 ?0709 03/14/22 ?0259 03/17/22 ?1941 03/18/22 ?7408  ?WBC 11.3* 12.7* 12.5* 14.0*  ? ? ?Liver Function Tests: ?Recent Labs  ?Lab 03/13/22 ?0709 03/17/22 ?0412 03/18/22 ?1448  ?AST 26 48* 51*  ?ALT 24 48* 60*  ?ALKPHOS 81 96 105  ?BILITOT 0.6 0.6 1.0  ?PROT 5.5* 6.7 6.9  ?ALBUMIN 2.1* 2.6* 2.6*  ? ?No results for input(s): LIPASE, AMYLASE in the last 168 hours. ?No results for input(s): AMMONIA in the last 168 hours. ? ?ABG ?   ?Component Value Date/Time  ? PHART 7.378 03/08/2022 1732  ? PCO2ART 55.9 (H) 03/08/2022 1732  ? PO2ART 98 03/08/2022 1732  ? HCO3 32.8 (H) 03/08/2022 1732  ? TCO2 34 (H) 03/08/2022 1732  ? O2SAT 97 03/08/2022 1732  ?  ? ?Coagulation Profile: ?No results for input(s): INR, PROTIME in the last 168 hours. ? ?Cardiac Enzymes: ?No results for input(s): CKTOTAL, CKMB, CKMBINDEX, TROPONINI in the last 168 hours. ? ?HbA1C: ?Hgb A1c MFr Bld  ?Date/Time Value Ref Range Status  ?03/03/2022 04:30 AM 5.4 4.8 - 5.6 % Final  ?  Comment:  ?  (NOTE) ?        Prediabetes: 5.7 - 6.4 ?        Diabetes: >6.4 ?        Glycemic control for adults with diabetes: <7.0 ?  ?08/19/2021 07:49 AM 5.5 4.8 - 5.6 % Final  ?  Comment:  ?  (NOTE) ?Pre  diabetes:          5.7%-6.4% ? ?Diabetes:              >6.4% ? ?Glycemic control for   <7.0% ?adults with diabetes ?  ? ? ?CBG: ?Recent Labs  ?Lab 03/17/22 ?1615 03/17/22 ?2031 03/18/22 ?0003 03/18/22 ?1856 03/18/22 ?3149  ?GLUCAP 124* 142* 136* 144* 156*  ? ?Critical care time: na ?  ? ?Todd Estrada ACNP ?Acute Care Nurse Practitioner ?[facility] ?Please consult Amion ?03/18/2022, 9:34 AM ? ? ? ? ? ? ?

## 2022-03-18 NOTE — Progress Notes (Addendum)
STROKE TEAM PROGRESS NOTE  ? ?INTERVAL HISTORY ?Patient is seen in his room with no family at the bedside.  He remains  hemodynamically stable overnight and did pull out his tracheostomy, but it was reinserted without difficulties.  His neurological exam is unchanged.  He seems to be moving his right side a lot more today. ? ?Vitals:  ? 03/18/22 0830 03/18/22 3976 03/18/22 1130 03/18/22 1153  ?BP:    119/79  ?Pulse:  88 86 88  ?Resp:  20 19 (!) 22  ?Temp:    98.4 ?F (36.9 ?C)  ?TempSrc:    Oral  ?SpO2: 92% 93% 100% 94%  ?Weight:      ?Height:      ? ?CBC:  ?Recent Labs  ?Lab 03/17/22 ?0412 03/18/22 ?7341  ?WBC 12.5* 14.0*  ?NEUTROABS 9.6* 11.1*  ?HGB 15.0 15.1  ?HCT 46.1 45.9  ?MCV 91.8 90.9  ?PLT 401* 474*  ? ? ?Basic Metabolic Panel:  ?Recent Labs  ?Lab 03/17/22 ?0412 03/18/22 ?9379  ?NA 140 136  ?K 4.4 4.5  ?CL 101 100  ?CO2 31 28  ?GLUCOSE 117* 134*  ?BUN 19 26*  ?CREATININE 0.71 0.81  ?CALCIUM 9.0 8.8*  ? ? ?Lipid Panel:  ?Recent Labs  ?Lab 03/12/22 ?0559  ?TRIG 69  ? ? ? ?IMAGING past 24 hours ?DG Abd 1 View ? ?Result Date: 03/04/2022 ?CLINICAL DATA:  Stroke Pre MRI screening EXAM: ABDOMEN - 1 VIEW COMPARISON:  None. FINDINGS: No dilated loops of bowel to indicate ileus or obstruction. Extensive atherosclerotic arterial calcifications are noted. Mild degenerative changes present lower lumbar spine. Enteric tube extensive left upper quadrant in the expected site of the stomach. No abnormal metallic densities are identified, however evaluation is limited due to multiple overlying external artifacts. IMPRESSION: No metallic density foreign body identified within the visualized abdomen. Electronically Signed   By: Miachel Roux M.D.   On: 03/04/2022 13:32  ? ?CT HEAD WO CONTRAST (5MM) ? ?Result Date: 03/05/2022 ?CLINICAL DATA:  Stroke follow-up EXAM: CT HEAD WITHOUT CONTRAST TECHNIQUE: Contiguous axial images were obtained from the base of the skull through the vertex without intravenous contrast. RADIATION DOSE  REDUCTION: This exam was performed according to the departmental dose-optimization program which includes automated exposure control, adjustment of the mA and/or kV according to patient size and/or use of iterative reconstruction technique. COMPARISON:  MRI brain 03/04/2022 FINDINGS: Brain: Subacute left MCA territory infarct with unchanged areas of cortical hypoattenuation. Encephalomalacia in the left PCA territory is unchanged. No acute hemorrhage or mass effect. Vascular: No abnormal hyperdensity of the major intracranial arteries or dural venous sinuses. No intracranial atherosclerosis. Skull: The visualized skull base, calvarium and extracranial soft tissues are normal. Sinuses/Orbits: Opacification of the maxillary and sphenoid sinuses. The orbits are normal. IMPRESSION: Subacute left MCA territory infarct without acute hemorrhage or mass effect. Electronically Signed   By: Ulyses Jarred M.D.   On: 03/05/2022 01:03  ? ?CT HEAD WO CONTRAST ? ?Result Date: 03/02/2022 ?CLINICAL DATA:  Seizure like activity, postictal, history of alcohol abuse EXAM: CT HEAD WITHOUT CONTRAST TECHNIQUE: Contiguous axial images were obtained from the base of the skull through the vertex without intravenous contrast. RADIATION DOSE REDUCTION: This exam was performed according to the departmental dose-optimization program which includes automated exposure control, adjustment of the mA and/or kV according to patient size and/or use of iterative reconstruction technique. COMPARISON:  08/20/2021 FINDINGS: Brain: Chronic left MCA territory infarct again identified, with interval dystrophic calcification in the infarct bed since  prior study. Chronic ischemic changes are again identified throughout the periventricular and subcortical white matter, left basal ganglia, and left cerebellar hemisphere. No evidence of acute infarct or hemorrhage. Lateral ventricles and remaining midline structures are unremarkable. No acute extra-axial fluid  collections. No mass effect. Vascular: No hyperdense vessel or unexpected calcification. Skull: Normal. Negative for fracture or focal lesion. Sinuses/Orbits: Chronic opacification right maxillary sinus. Stable mucosal thickening left maxillary sinus. Other: None. IMPRESSION: 1. Chronic ischemic changes as above. No acute intracranial process. Electronically Signed   By: Randa Ngo M.D.   On: 03/02/2022 19:57  ? ?CT CERVICAL SPINE WO CONTRAST ? ?Result Date: 03/02/2022 ?CLINICAL DATA:  Seizure like activity, history of alcohol abuse, neck trauma EXAM: CT CERVICAL SPINE WITHOUT CONTRAST TECHNIQUE: Multidetector CT imaging of the cervical spine was performed without intravenous contrast. Multiplanar CT image reconstructions were also generated. RADIATION DOSE REDUCTION: This exam was performed according to the departmental dose-optimization program which includes automated exposure control, adjustment of the mA and/or kV according to patient size and/or use of iterative reconstruction technique. COMPARISON:  None. FINDINGS: Alignment: Alignment is anatomic. Skull base and vertebrae: No acute fracture. No primary bone lesion or focal pathologic process. Soft tissues and spinal canal: No prevertebral fluid or swelling. No visible canal hematoma. Disc levels: Multilevel cervical spondylosis, with prominent anterior osteophyte formation from C4-5 through C6-7. Circumferential disc osteophyte complex at C3-4 and C6-7 results in right greater than left neural foraminal narrowing. Multilevel facet hypertrophy greatest from C4 through C6. Upper chest: Central airways patent.  Severe upper lobe emphysema. Other: Reconstructed images demonstrate no additional findings. IMPRESSION: 1. No acute cervical spine fracture. 2. Multilevel cervical spondylosis and facet hypertrophy as above. 3. Emphysema. Electronically Signed   By: Randa Ngo M.D.   On: 03/02/2022 20:00  ? ?MR ANGIO HEAD WO CONTRAST ? ?Addendum Date: 03/04/2022    ?ADDENDUM REPORT: 03/04/2022 17:39 ADDENDUM: Findings discussed with Dr. Leonel Ramsay via telephone at 5:35 p.m. Electronically Signed   By: Margaretha Sheffield M.D.   On: 03/04/2022 17:39  ? ?Result Date: 03/04/2022 ?CLINICAL DATA:  Neuro deficit, acute, stroke suspected; Stroke, follow up EXAM: MRI HEAD WITHOUT CONTRAST MRA HEAD WITHOUT CONTRAST TECHNIQUE: Multiplanar, multi-echo pulse sequences of the brain and surrounding structures were acquired without intravenous contrast. Angiographic images of the Circle of Willis were acquired using MRA technique without intravenous contrast. COMPARISON:  No pertinent prior exam. FINDINGS: MRI HEAD FINDINGS Brain: Acute left MCA territory infarcts predominantly involving the left frontal, parietal and temporal lobes. Infarcts involve both cortex and white matter. Small acute infarct involving the left insula. Associated edema and sulcal effacement without midline shift. Areas of susceptibility artifact and T2 hypointensity. Areas of T2 hypointensity, T1 hyperintensity and susceptibility artifact is concerning for acute hemorrhage although an element prior hemorrhage/cortical laminar necrosis is possible posteriorly. No hydrocephalus. Vascular: Major arterial flow voids are maintained at the skull base. Skull and upper cervical spine: Normal marrow signal. Sinuses/Orbits: Paranasal sinus mucosal thickening. No acute orbital findings. Other: Small left greater than right mastoid effusions. MRA HEAD FINDINGS Anterior circulation: The intracranial ICAs are now patent. Left ICAs attenuated, likely due to pre-existing atherosclerosis with probably moderate to severe paraclinoid ICA stenosis. Bilateral MCAs and ACAs are patent without proximal hemodynamically significant stenosis. Posterior circulation: Chronically occluded left intradural vertebral artery. Right intradural vertebral artery, basilar artery and posterior cerebral arteries are patent without proximal hemodynamically  significant stenosis. Right fetal type PCA, anatomic variant. Anatomic variants: As detailed above. IMPRESSION: MRI:  1. Acute left MCA territory infarcts predominantly involving the left frontal, parietal and

## 2022-03-18 NOTE — Progress Notes (Signed)
Occupational Therapy Treatment ?Patient Details ?Name: Todd Estrada ?MRN: [id number] ?DOB: 1961-02-03 ?Today's Date: 03/18/2022 ? ? ?History of present illness 61 y/o male with a history of Left MCA stroke in 3295 with a complicated post stroke course presented on 3/15 with a 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  He was extubated following procedure, but reintubated due to respiratory failure.  Self extubated overnight 3/19, now on venturi mask.  Re-intubated on 3/21 and bronchoscopy and tracheostomy performed on 3/22. ?  ?OT comments ? Pt in restraints. Per nursing has pulled out trach x2. Pt very lethargic and unable to maintain level of arousal this date. Increased time spent cleaning pt as condom cath was off with urine soaked blanket on pt and binder was soiled and needed to be replaced. PMSV used during session, however pt not vocalizing. Intermittently following 1 step commands with delay. Total A to roll in bed this date and Max to total A with ADL. This is a functional decline since last note (unsure if pt had sedating meds). Pt will benefit form aggressive rehab however will most likely need increased assistance for ADL and mobility after DC. Discussed with AIR CM. Will follow up next week.   ? ?Recommendations for follow up therapy are one component of a multi-disciplinary discharge planning process, led by the attending physician.  Recommendations may be updated based on patient status, additional functional criteria and insurance authorization. ?   ?Follow Up Recommendations ? Acute inpatient rehab (3hours/day)  ?  ?Assistance Recommended at Discharge Frequent or constant Supervision/Assistance  ?Patient can return home with the following ? Direct supervision/assist for medications management;Assist for transportation;Help with stairs or ramp for entrance;Two people to help with walking and/or transfers;Two people to help with bathing/dressing/bathroom;Direct  supervision/assist for financial management;Assistance with cooking/housework;Assistance with feeding ?  ?Equipment Recommendations ? Hospital bed;Tub/shower bench  ?  ?Recommendations for Other Services Rehab consult ? ?  ?Precautions / Restrictions Precautions ?Precautions: Fall ?Precaution Comments: posey belt, wrist restraint on L, R inattention, trach collar 28%%FiO2 ?Restrictions ?Weight Bearing Restrictions: No  ? ? ?  ? ?Mobility Bed Mobility ?  ?  ?  ?  ?  ?  ?  ?General bed mobility comments: total A to roll ?  ? ?Transfers ?  ?  ? Unable to attempt this date ?  ?  ?  ?  ?  ?  ?  ?  ?  ?Balance   ?  ?Sitting balance-Leahy Scale:  (to poor.); HOB used to place pt in more of a chair position; R lat lean, pushing with LUE at times ?  ?  ?  ?  ?  ?  ?  ?  ?  ?  ?  ?  ?  ?  ?  ?  ?   ? ?ADL either performed or assessed with clinical judgement  ? ?ADL Overall ADL's : Needs assistance/impaired ?  ?  ?  ?  ?  ?  ?  ?  ?  ?  ?  ?  ?  ?  ?  ?  ?  ?  ?Functional mobility during ADLs:  (unable to maintain level of arousal to attmept mobility this date) ?General ADL Comments: Max A with UB bathing and washing face ?  ? ?Extremity/Trunk Assessment Upper Extremity Assessment ?RUE Deficits / Details: flaccid; IP flexion contracture R hand; limited ROM into flexion of digits; at baseline able to lift and lower shoulder however non-functional hand;  moving RUE at times spontaneously into adduction/synergy pattern; not moving to command ?LUE Deficits / Details: moving spontaneously; has apparently pulled out trach ?  ?Lower Extremity Assessment ?Lower Extremity Assessment: Defer to PT evaluation ?  ?  ?  ? ?Vision   ?Vision Assessment?: Vision impaired- to be further tested in functional context ?  ?Perception Perception ?Perception:  (R inattention; will further assess) ?  ?Praxis Praxis ?Praxis: Impaired ?Praxis-Other Comments: motor planning deficits ?  ? ?Cognition Arousal/Alertness: Lethargic ?Behavior During Therapy:  Flat affect ?Overall Cognitive Status: No family/caregiver present to determine baseline cognitive functioning ?Area of Impairment: Following commands, Awareness, Memory, Attention, Safety/judgement, Problem solving ?  ?  ?  ?  ?  ?  ?  ?  ?  ?Current Attention Level: Focused ?Memory: Decreased short-term memory, Decreased recall of precautions ?Following Commands: Follows one step commands inconsistently ?Safety/Judgement: Decreased awareness of deficits, Decreased awareness of safety ?Awareness: Intellectual ?Problem Solving: Slow processing ?  ?  ?  ?   ?Exercises Exercises: General Upper Extremity ?General Exercises - Upper Extremity ?Shoulder Flexion: Left, 10 reps, AAROM, AROM ?Elbow Flexion: AROM, AAROM, Left, 10 reps ?Elbow Extension: AROM, AAROM, Left ?Digit Composite Flexion: Left ?Composite Extension: AROM, AAROM, Left ? ?  ?Shoulder Instructions   ? ? ?  ?General Comments    ? ? ?Pertinent Vitals/ Pain       Pain Assessment ?Pain Assessment: Faces ?Faces Pain Scale: No hurt ?Breathing: normal ?Negative Vocalization: none ?Facial Expression: smiling or inexpressive ?Body Language: relaxed ?Consolability: no need to console ?PAINAD Score: 0 ?Pain Location: generalized ?Pain Descriptors / Indicators: Grimacing ?Pain Intervention(s): Limited activity within patient's tolerance ? ?Home Living   ?  ?  ?  ?  ?  ?  ?  ?  ?  ?  ?  ?  ?  ?  ?  ?  ?  ?  ? ?  ?Prior Functioning/Environment    ?  ?  ?  ?   ? ?Frequency ? Min 2X/week  ? ? ? ? ?  ?Progress Toward Goals ? ?OT Goals(current goals can now be found in the care plan section) ? Progress towards OT goals: Not progressing toward goals - comment (due to level of arousal) ? ?Acute Rehab OT Goals ?OT Goal Formulation: Patient unable to participate in goal setting ?Time For Goal Achievement: 03/24/22 ?Potential to Achieve Goals: Fair ?ADL Goals ?Pt Will Perform Grooming: with min assist ?Pt Will Perform Upper Body Bathing: with min assist ?Pt Will Perform Upper  Body Dressing: with min assist ?Pt Will Transfer to Toilet: squat pivot transfer;with max assist  ?Plan Discharge plan remains appropriate   ? ?Co-evaluation ? ? ?   ?  ?  ?  ?  ? ?  ?AM-PAC OT "6 Clicks" Daily Activity     ?Outcome Measure ? ? Help from another person eating meals?: Total ?Help from another person taking care of personal grooming?: A Lot ?Help from another person toileting, which includes using toliet, bedpan, or urinal?: Total ?Help from another person bathing (including washing, rinsing, drying)?: Total ?Help from another person to put on and taking off regular upper body clothing?: Total ?Help from another person to put on and taking off regular lower body clothing?: Total ?6 Click Score: 7 ? ?  ?End of Session Equipment Utilized During Treatment: Oxygen ? ?OT Visit Diagnosis: Unsteadiness on feet (R26.81);Other abnormalities of gait and mobility (R26.89);Muscle weakness (generalized) (M62.81);Low vision, both eyes (H54.2);Other symptoms and signs involving  the nervous system (R29.898);Other symptoms and signs involving cognitive function;Cognitive communication deficit (R41.841);Hemiplegia and hemiparesis ?Symptoms and signs involving cognitive functions: Cerebral infarction ?Hemiplegia - Right/Left: Right ?Hemiplegia - dominant/non-dominant: Dominant ?Hemiplegia - caused by: Cerebral infarction ?  ?Activity Tolerance Patient limited by lethargy;Patient limited by fatigue;Other (comment) (decreased level of arousal) ?  ?Patient Left in bed;with call bell/phone within reach;with nursing/sitter in room (nursing cleaning pt; restraint left off LUE adn communicated to nursing) ?  ?Nurse Communication Mobility status;Other (comment) ?  ? ?   ? ?Time: 1610-9604 ?OT Time Calculation (min): 25 min ? ?Charges: OT General Charges ?$OT Visit: 1 Visit ?OT Treatments ?$Self Care/Home Management : 23-37 mins ? ?[facility], OT/L  ? ?Acute OT Clinical Specialist ?Acute Rehabilitation Services ?Pager  [phone] ?Office [phone]  ? ?Tvisha Schwoerer,HILLARY ?03/18/2022, 4:28 PM ?

## 2022-03-18 NOTE — Progress Notes (Signed)
?PROGRESS NOTE ? ? ?Todd Estrada  AUQ:[id number] DOB: 22-Jun-1961 DOA: 03/02/2022 ?PCP: Pcp, No  ?Brief Narrative:  ?61 year old white male community dwelling ?Prior left MCA/CVA + petechial hemorrhage, chronic combined CHF ?Known history of stage II pressure injuries ?HTN HLD?  EtOH ? ?Admit 03/02/2022 seizure-like activity + aphasia CTA head neck = occlusion ICA-->thrombectomy with TICI 2 results-extubated transferred to neuro ICU ?Placed on Keppra ?CCM evaluated patient secondary to unresponsiveness, aspiration ?Underwent tracheostomy 03/09/2022  ? ?3/15 admitted for acute L MCA stroke due to L ICA occlusion with first time seizure s/p revascularization of L ICA occlusion ?3/16 Intubated for airway protection; started unasyn; echo LVEF 40-45%, RVSP normal ?3/17 MRI Brain showed acute infarct in left frontal, temporal and parietal lobes with findings worrisome for acute infarct, left vert occluded; CT head did not show hemorrhage; respiratory notes thick secretions,  ?3/18 following some commands on exam, extubated the re-intubated due to hypoxemia, inability to clear secretions ?3/19 self extubated overnight, remains on NRB ?3/21 required reintubation for inability to protect airway and aspiration  ?3/22 bedside perc tracheostomy with bronchoscopy  ?3/24 PEG ?3/26 started on trach collar ?3/30 patient decannulated trach-replaced with cuffless Shiley #6 ? ?Hospital-Problem based course ? ?Encephalopathy 2/2 left MCA stroke + occlusion/thrombectomy 3/15 ?Agitated delirium 3/19 ?Seizure on admission ?Precedex until 3/19- still monophasic requires restraints for safety  ?Continue Keppra 500 twice daily as per neurology ?Continue GDMT atorvastatin 40+ ASA 81+ Plavix 75 hydralazine 10 to 40 mg every 4 as needed systolic >638 ?Will give seroquel low dose q pm ?Eventual CIR placement-insurance Auth started 3/31 ?Septic shock  2/2 Aspiration PNA ?Hypoxic respiratory failure  ? + trach ?Significant dysphagia status post PEG  tube placement 3/24 [gen surgery] ?Nodular density L side? ?Currently on tube feeds at 55, continue free water 200 every 12 ?CT chest obtained 2/2 leukocytosis-pending although performed 3/31 ?Presumed COPD + tobacco ~his stroke ?Nicotine patch--Brovana 15 twice daily Pulmicort 0.5 twice daily Yupelri 175 daily hypertonic saline twice daily per respiratory and Robitussin ?Systolic diastolic heart failure ?Prior to admission not on goal-directed therapy?  Not on ACE?Eventually resume metoprolol 12.5 twice daily already on aspirin ?EF this admission = 40-45% ?A1c 5.4 ?do not aggressively control A1c below 6.0-needs follow-up in the outpatient setting for this-current trends 1 47-1 57 ?Right upper extremity thrombophlebitis ?Stage II pressure injury from prior to admission ? ?DVT prophylaxis:  ?Code Status:  ?Family Communication: d/w [name] [id number] @ 03/16/22 ?Disposition:  ?Status is: Inpatient ?Remains inpatient appropriate because:  ? ?need for further rehab--CIR ?  ?Consultants:  ?neurology ? ?Procedures: multiple ? ?Antimicrobials: comp[leted  ? ? ?Subjective: ? ?More awake and alert-note pulled out trach again last night which was replaced, also pulling at left wrist restraint PIV and condom cath ?Overall is interactive ? ?Objective: ?Vitals:  ? 03/18/22 1130 03/18/22 1153 03/18/22 1549 03/18/22 1639  ?BP:  119/79  107/70  ?Pulse: 86 88 87 84  ?Resp: 19 (!) 22 18 18   ?Temp:  98.4 ?F (36.9 ?C)  98.6 ?F (37 ?C)  ?TempSrc:  Oral  Oral  ?SpO2: 100% 94% 98%   ?Weight:      ?Height:      ? ? ?Intake/Output Summary (Last 24 hours) at 03/18/2022 1735 ?Last data filed at 03/18/2022 1300 ?Gross per 24 hour  ?Intake 970 ml  ?Output 1000 ml  ?Net -30 ml  ? ? ?Filed Weights  ? 03/14/22 0500 03/15/22 0500 03/17/22 0500  ?Weight: 79.6 kg 79 kg  81.5 kg  ? ? ?Examination: ? ?Trach collar in place , 6 Shiley uncuffed ?No ict no pallor ?Poor exam to chest given habitus ?Peg in place ?Deficits on right side seem to be  improving especially in the right arm-he has some meaningful discussion but is monosyllabic-speech remains somewhat garbled ?Can raise both legs off the bed ?Does follow commands ? ?Data Reviewed: personally reviewed  ? ?CBC ?   ?Component Value Date/Time  ? WBC 14.0 (H) 03/18/2022 0314  ? RBC 5.05 03/18/2022 0314  ? HGB 15.1 03/18/2022 0314  ? HCT 45.9 03/18/2022 0314  ? PLT 474 (H) 03/18/2022 0314  ? MCV 90.9 03/18/2022 0314  ? MCH 29.9 03/18/2022 0314  ? MCHC 32.9 03/18/2022 0314  ? RDW 12.9 03/18/2022 0314  ? LYMPHSABS 1.6 03/18/2022 0314  ? MONOABS 1.1 (H) 03/18/2022 0314  ? EOSABS 0.2 03/18/2022 0314  ? BASOSABS 0.1 03/18/2022 0314  ? ? ?  Latest Ref Rng & Units 03/18/2022  ?  3:14 AM 03/17/2022  ?  4:12 AM 03/16/2022  ?  5:02 AM  ?CMP  ?Glucose 70 - 99 mg/dL 134   117   137    ?BUN 8 - 23 mg/dL 26   19   15     ?Creatinine 0.61 - 1.24 mg/dL 0.81   0.71   0.66    ?Sodium 135 - 145 mmol/L 136   140   138    ?Potassium 3.5 - 5.1 mmol/L 4.5   4.4   4.2    ?Chloride 98 - 111 mmol/L 100   101   100    ?CO2 22 - 32 mmol/L 28   31   28     ?Calcium 8.9 - 10.3 mg/dL 8.8   9.0   8.8    ?Total Protein 6.5 - 8.1 g/dL 6.9   6.7     ?Total Bilirubin 0.3 - 1.2 mg/dL 1.0   0.6     ?Alkaline Phos 38 - 126 U/L 105   96     ?AST 15 - 41 U/L 51   48     ?ALT 0 - 44 U/L 60   48     ? ? ? ?Radiology Studies: ?DG CHEST PORT 1 VIEW ? ?Result Date: 03/17/2022 ?CLINICAL DATA:  Seizures, pneumonia. EXAM: PORTABLE CHEST 1 VIEW COMPARISON:  March 09, 2022. FINDINGS: The heart size and mediastinal contours are within normal limits. Tracheostomy tube is in good position. Rounded left perihilar opacity is noted concerning for possible neoplasm. Possible rounded density is noted in right lower lobe is well. The visualized skeletal structures are unremarkable. IMPRESSION: Rounded left perihilar density is noted as well as small right basilar opacity. CT scan of the chest with Intravenous contrast is recommended to rule out pulmonary nodule or mass.  Electronically Signed   By: Marijo Conception M.D.   On: 03/17/2022 08:13   ? ? ?Scheduled Meds: ? arformoterol  15 mcg Nebulization BID  ? aspirin  81 mg Per Tube Daily  ? atorvastatin  40 mg Per Tube Daily  ? budesonide (PULMICORT) nebulizer solution  0.5 mg Nebulization BID  ? chlorhexidine gluconate (MEDLINE KIT)  15 mL Mouth Rinse BID  ? Chlorhexidine Gluconate Cloth  6 each Topical Q0600  ? clopidogrel  75 mg Per Tube Daily  ? docusate  100 mg Per Tube BID  ? enoxaparin (LOVENOX) injection  40 mg Subcutaneous Q24H  ? feeding supplement (PROSource TF)  90 mL Per Tube BID  ?  folic acid  1 mg Per Tube Daily  ? free water  200 mL Per Tube Q12H  ? glycopyrrolate  1 mg Oral BID  ? guaiFENesin  15 mL Per Tube Q6H  ? levETIRAcetam  500 mg Per Tube BID  ? mouth rinse  15 mL Mouth Rinse QID  ? multivitamin with minerals  1 tablet Per Tube Daily  ? nicotine  14 mg Transdermal Daily  ? pantoprazole sodium  40 mg Per Tube Q24H  ? revefenacin  175 mcg Nebulization Daily  ? sodium chloride HYPERTONIC  4 mL Nebulization BID  ? thiamine  100 mg Per Tube Daily  ? ?Continuous Infusions: ? sodium chloride 10 mL/hr at 03/14/22 0600  ? sodium chloride Stopped (03/15/22 0904)  ? feeding supplement (OSMOLITE 1.5 CAL) 55 mL/hr at 03/18/22 0300  ? ? ? LOS: 16 days  ? ?Time spent: 37 minutes ? ?Nita Sells, MD ?[facility] ?To contact the attending provider between 7A-7P or the covering provider during after hours 7P-7A, please log into the web site [email or web address] and access using universal [place] password for that web site. If you do not have the password, please call the hospital operator. ? ?03/18/2022, 5:35 PM  ? ? ?

## 2022-03-18 NOTE — Plan of Care (Signed)
?  Problem: Safety: ?Goal: Non-violent Restraint(s) ?Outcome: Progressing ?  ?Problem: Education: ?Goal: Knowledge of disease or condition will improve ?Outcome: Progressing ?Goal: Knowledge of secondary prevention will improve (SELECT ALL) ?Outcome: Progressing ?Goal: Knowledge of patient specific risk factors will improve (INDIVIDUALIZE FOR PATIENT) ?Outcome: Progressing ?Goal: Individualized Educational Video(s) ?Outcome: Progressing ?  ?Problem: Coping: ?Goal: Will verbalize positive feelings about self ?Outcome: Progressing ?Goal: Will identify appropriate support needs ?Outcome: Progressing ?  ?Problem: Health Behavior/Discharge Planning: ?Goal: Ability to manage health-related needs will improve ?Outcome: Progressing ?  ?Problem: Self-Care: ?Goal: Ability to participate in self-care as condition permits will improve ?Outcome: Progressing ?Goal: Verbalization of feelings and concerns over difficulty with self-care will improve ?Outcome: Progressing ?Goal: Ability to communicate needs accurately will improve ?Outcome: Progressing ?  ?Problem: Nutrition: ?Goal: Risk of aspiration will decrease ?Outcome: Progressing ?Goal: Dietary intake will improve ?Outcome: Progressing ?  ?Problem: Ischemic Stroke/TIA Tissue Perfusion: ?Goal: Complications of ischemic stroke/TIA will be minimized ?Outcome: Progressing ?  ?

## 2022-03-18 NOTE — Progress Notes (Signed)
RT notified by nurse that pr pulled his trach out. RT reinserted trach back in with no complications. PT v/s remained stable while trach was out. PT was sx' d and trach care done. PT is currently in wrist restraints. ?

## 2022-03-18 NOTE — Progress Notes (Signed)
Inpatient Rehab Admissions Coordinator:  ? ?Was able to speak with patient's cousin.  Per cousin, he was providing some assist for ADLs at baseline and is prepared to resume caregiver role 24/7 if needed.  I reviewed 3 hrs/day of therapy, estimated length of stay ~2 weeks, and goals of supervision, potentially some min assist.  He is in agreement. I will start insurance auth request today.  ? ?Shann Medal, PT, DPT ?Admissions Coordinator ?[phone] ?03/18/22  ?3:24 PM ? ?

## 2022-03-18 NOTE — Progress Notes (Signed)
PT Cancellation Note ? ?Patient Details ?Name: Todd Estrada ?MRN: [id number] ?DOB: 12/26/1960 ? ? ?Cancelled Treatment:    Reason Eval/Treat Not Completed: Patient at procedure or test/unavailable.  Pt in peri-care session and will be going imminently to CT.  Will defer today. ?03/18/2022 ? ?Ginger Carne., PT ?Acute Rehabilitation Services ?[phone]  (pager) ?[phone]  (office) ? ? ?[name] ?03/18/2022, 4:41 PM ?

## 2022-03-18 NOTE — Progress Notes (Addendum)
? ?NAME:  Todd Estrada, MRN:  [id number], DOB:  1961/09/24, LOS: 52 ?ADMISSION DATE:  03/02/2022, CONSULTATION DATE:  3/16 ?REFERRING MD:  Erlinda Hong, CHIEF COMPLAINT:  Confusion, seizure  ? ?History of Present Illness:  ?61 y/o male with a history of Left MCA stroke in 5621 with a complicated post stroke course presented on 3/15 with a 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  Intubated on 3/16 for airway protection. ? ?Pertinent  Medical History  ?Hyeprtension ?Left MCA stroke ?Chronic combined CHF ? ?Significant Hospital Events: ?Including procedures, antibiotic start and stop dates in addition to other pertinent events   ?3/15 admitted for acute L MCA stroke due to L ICA occlusion with first time seizure s/p revascularization of L ICA occlusion ?3/16 Intubated for airway protection; started unasyn; echo LVEF 40-45%, RVSP normal ?3/17 MRI Brain showed acute infarct in left frontal, temporal and parietal lobes with findings worrisome for acute infarct, left vert occluded; CT head did not show hemorrhage; respiratory notes thick secretions,  ?3/18 following some commands on exam, extubated the re-intubated due to hypoxemia, inability to clear secretions ?3/19 self extubated overnight, remains on NRB ?3/21 required reintubation for inability to protect airway and aspiration  ?3/22 bedside perc tracheostomy with bronchoscopy  ?3/24 PEG ?3/26 started on trach collar ?03/17/2022 pulmonary critical care called to bedside for patient who has decannulate himself x2.  He is now restrained and most likely needs a sitter ?Interim History / Subjective:  ?His tracheostomy out during nighttime hours. ? ?Objective   ?Blood pressure 110/71, pulse 88, temperature 99.8 ?F (37.7 ?C), temperature source Oral, resp. rate 20, height 6' (1.829 m), weight 81.5 kg, SpO2 93 %. ?   ?FiO2 (%):  [28 %-35 %] 35 %  ? ?Intake/Output Summary (Last 24 hours) at 03/18/2022 0942 ?Last data filed at 03/18/2022 0500 ?Gross per 24 hour   ?Intake 1356 ml  ?Output 1900 ml  ?Net -544 ml  ? ?Filed Weights  ? 03/14/22 0500 03/15/22 0500 03/17/22 0500  ?Weight: 79.6 kg 79 kg 81.5 kg  ? ? ?General: Disheveled male in no acute distress at time of examination ?HEENT: MM pink/moist, #6 9 cuffed trach in place notable for copious secretions ?Neuro: Does not really follow commands right-sided neglect ?CV: Heart sounds are distant ?PULM: Coarse rhonchi bilaterally ? ?GI: soft, bsx4 active  ?GU: Amber urine ?Extremities: warm/dry, negative edema  ?Skin: no rashes or lesions ? ? ?Resolved Hospital Problem list   ?Septic shock due to aspiration pneumonia> resolved ?Constipation ? ?Assessment & Plan:  ?Acute encephalopathy due to left MCA stroke from left ICA occlusion requiring thrombectomy and stent placement on 3/15 ?Seizure disorder ?Agitated delirium on 3/19> risk to self pulling at tubes, lines ?Per neuro ? ?Acute hypoxemic respiratory failure due to aspiration pneumonia s/p tracheostomy > improving ?Thick pulmonary secretions ?Concern for emphysema/COPD > not in history but exam supports ?Patient has been tolerating trach collar for 48 hours as of this morning. Able to generate cough to clear secretions. Can transfer out of ICU ?03/18/2022 pulmonary critical care called to bedside for self extubating tracheostomy.  Note he has copious secretions and therefore tracheostomy cannot be removed and left out. ? ?Restraints to limit self extubation ?May need sitter ?He seems to self decannulate during nighttime hours possible nighttime sitter versus a routine for suctioning.  I suspect his tracheostomy comes plugged and in desperation he removes it. ?Consider gentle diuresis ?Low-dose Robinul ?Would not want to dry his secretions up  too much and cause tracheal plugging ?Continue pulmonary toilet ?Continue oxygen ?Continue bronchodilators ?Pulmonary critical care will follow weekly ? ? ? ? ?Combined systolic and diastolic heart failure, chronic ?Hypertension ?Per  primary ? ?RUE thrombophlebitis ?Per primary ? ?Best Practice (right click and "Reselect all SmartList Selections" daily)  ?Per primary ?Diet/type: tubefeeds ?DVT prophylaxis: LMWH ?GI prophylaxis: PPI ?Lines: N/A ?Foley:  N/A ?Code Status:  full code ?Last date of multidisciplinary goals of care discussion [per stroke service] ? ?Labs   ?CBC: ?Recent Labs  ?Lab 03/12/22 ?0559 03/13/22 ?6789 03/14/22 ?0259 03/17/22 ?3810 03/18/22 ?1751  ?WBC 10.5 11.3* 12.7* 12.5* 14.0*  ?NEUTROABS  --   --   --  9.6* 11.1*  ?HGB 13.5 12.7* 13.5 15.0 15.1  ?HCT 40.9 39.3 41.8 46.1 45.9  ?MCV 92.1 92.7 92.1 91.8 90.9  ?PLT 200 222 261 401* 474*  ? ? ?Basic Metabolic Panel: ?Recent Labs  ?Lab 03/14/22 ?0259 03/15/22 ?0428 03/16/22 ?0502 03/17/22 ?0258 03/18/22 ?5277  ?NA 138 139 138 140 136  ?K 3.7 4.3 4.2 4.4 4.5  ?CL 105 103 100 101 100  ?CO2 29 28 28 31 28   ?GLUCOSE 122* 138* 137* 117* 134*  ?BUN 11 14 15 19  26*  ?CREATININE 0.62 0.60* 0.66 0.71 0.81  ?CALCIUM 8.2* 8.7* 8.8* 9.0 8.8*  ? ?GFR: ?Estimated Creatinine Clearance: 105.1 mL/min (by C-G formula based on SCr of 0.81 mg/dL). ?Recent Labs  ?Lab 03/13/22 ?0709 03/14/22 ?0259 03/17/22 ?8242 03/18/22 ?3536  ?WBC 11.3* 12.7* 12.5* 14.0*  ? ? ?Liver Function Tests: ?Recent Labs  ?Lab 03/13/22 ?0709 03/17/22 ?0412 03/18/22 ?1443  ?AST 26 48* 51*  ?ALT 24 48* 60*  ?ALKPHOS 81 96 105  ?BILITOT 0.6 0.6 1.0  ?PROT 5.5* 6.7 6.9  ?ALBUMIN 2.1* 2.6* 2.6*  ? ?No results for input(s): LIPASE, AMYLASE in the last 168 hours. ?No results for input(s): AMMONIA in the last 168 hours. ? ?ABG ?   ?Component Value Date/Time  ? PHART 7.378 03/08/2022 1732  ? PCO2ART 55.9 (H) 03/08/2022 1732  ? PO2ART 98 03/08/2022 1732  ? HCO3 32.8 (H) 03/08/2022 1732  ? TCO2 34 (H) 03/08/2022 1732  ? O2SAT 97 03/08/2022 1732  ?  ? ?Coagulation Profile: ?No results for input(s): INR, PROTIME in the last 168 hours. ? ?Cardiac Enzymes: ?No results for input(s): CKTOTAL, CKMB, CKMBINDEX, TROPONINI in the last 168  hours. ? ?HbA1C: ?Hgb A1c MFr Bld  ?Date/Time Value Ref Range Status  ?03/03/2022 04:30 AM 5.4 4.8 - 5.6 % Final  ?  Comment:  ?  (NOTE) ?        Prediabetes: 5.7 - 6.4 ?        Diabetes: >6.4 ?        Glycemic control for adults with diabetes: <7.0 ?  ?08/19/2021 07:49 AM 5.5 4.8 - 5.6 % Final  ?  Comment:  ?  (NOTE) ?Pre diabetes:          5.7%-6.4% ? ?Diabetes:              >6.4% ? ?Glycemic control for   <7.0% ?adults with diabetes ?  ? ? ?CBG: ?Recent Labs  ?Lab 03/17/22 ?1615 03/17/22 ?2031 03/18/22 ?0003 03/18/22 ?1540 03/18/22 ?0867  ?GLUCAP 124* 142* 136* 144* 156*  ? ?Critical care time: na  ?  ? ?Richardson Landry Tung Pustejovsky ACNP ?Acute Care Nurse Practitioner ?[facility] ?Please consult Amion ?03/18/2022, 9:42 AM ? ? ? ? ? ? ?

## 2022-03-18 NOTE — Progress Notes (Signed)
Pt found to have removed his L wrist restraint, trach, PIV and condom cath. Posey belt and peg tube still intact. Pt maintaining O2 sats. RT and rapid response called to bedside.  ?

## 2022-03-19 DIAGNOSIS — I63512 Cerebral infarction due to unspecified occlusion or stenosis of left middle cerebral artery: Secondary | ICD-10-CM | POA: Diagnosis not present

## 2022-03-19 LAB — GLUCOSE, CAPILLARY
Glucose-Capillary: 118 mg/dL — ABNORMAL HIGH (ref 70–99)
Glucose-Capillary: 124 mg/dL — ABNORMAL HIGH (ref 70–99)
Glucose-Capillary: 129 mg/dL — ABNORMAL HIGH (ref 70–99)
Glucose-Capillary: 137 mg/dL — ABNORMAL HIGH (ref 70–99)
Glucose-Capillary: 140 mg/dL — ABNORMAL HIGH (ref 70–99)
Glucose-Capillary: 142 mg/dL — ABNORMAL HIGH (ref 70–99)
Glucose-Capillary: 152 mg/dL — ABNORMAL HIGH (ref 70–99)

## 2022-03-19 LAB — BASIC METABOLIC PANEL
Anion gap: 7 (ref 5–15)
BUN: 27 mg/dL — ABNORMAL HIGH (ref 8–23)
CO2: 28 mmol/L (ref 22–32)
Calcium: 8.7 mg/dL — ABNORMAL LOW (ref 8.9–10.3)
Chloride: 101 mmol/L (ref 98–111)
Creatinine, Ser: 0.67 mg/dL (ref 0.61–1.24)
GFR, Estimated: >60 mL/min (ref >60)
Glucose, Bld: 146 mg/dL — ABNORMAL HIGH (ref 70–99)
Potassium: 4.4 mmol/L (ref 3.5–5.1)
Sodium: 136 mmol/L (ref 135–145)

## 2022-03-19 MED ORDER — SODIUM CHLORIDE 0.9 % IV SOLN: INTRAVENOUS | Status: DC

## 2022-03-19 NOTE — Progress Notes (Signed)
?PROGRESS NOTE ? ? ?Todd Estrada  EUM:[id number] DOB: 09/20/1961 DOA: 03/02/2022 ?PCP: Pcp, No  ?Brief Narrative:  ?61 year old white male community dwelling ?Prior left MCA/CVA + petechial hemorrhage, chronic combined CHF ?Known history of stage II pressure injuries ?HTN HLD?  EtOH ? ?Admit 03/02/2022 seizure-like activity + aphasia CTA head neck = occlusion ICA-->thrombectomy with TICI 2 results-extubated transferred to neuro ICU ?Placed on Keppra ?CCM evaluated patient secondary to unresponsiveness, aspiration ?Underwent tracheostomy 03/09/2022  ? ?3/15 admitted for acute L MCA stroke due to L ICA occlusion with first time seizure s/p revascularization of L ICA occlusion ?3/16 Intubated for airway protection; started unasyn; echo LVEF 40-45%, RVSP normal ?3/17 MRI Brain showed acute infarct in left frontal, temporal and parietal lobes with findings worrisome for acute infarct, left vert occluded; CT head did not show hemorrhage; respiratory notes thick secretions,  ?3/18 following some commands on exam, extubated the re-intubated due to hypoxemia, inability to clear secretions ?3/19 self extubated overnight, remains on NRB ?3/21 required reintubation for inability to protect airway and aspiration  ?3/22 bedside perc tracheostomy with bronchoscopy  ?3/24 PEG ?3/26 started on trach collar ?3/30 patient decannulated trach-replaced with cuffless Shiley #6 ? ?Hospital-Problem based course ? ?Encephalopathy 2/2 left MCA stroke + occlusion/thrombectomy 3/15 ?Agitated delirium 3/19 ?Seizure on admission ?Precedex until 3/19- still monophasic requires restraints for safety as he tends to pull out his trach ?Continue Keppra 500 twice daily as per neurology ?Continue GDMT atorvastatin 40+ ASA 81+ Plavix 75 hydralazine 10 to 40 mg every 4 as needed systolic >540 ?Started 3/31 seroquel 50 q pm ?Eventual CIR placement-insurance Auth started 3/31 ?Septic shock  2/2 Aspiration PNA ?Hypoxic respiratory failure  ? +  trach ?Significant dysphagia status post PEG tube placement 3/24 [gen surgery] ?Nodular density L side? ?Currently on tube feeds at 55, continue free water 200 every 12 ?CT chest obtained 2/2 leukocytosis-pending although performed 3/31 ?SLP to revisit with regards to neurocognitive functioning in addition to diet graduation when able ?Presumed COPD + tobacco ~his stroke ?Nicotine patch--Brovana 15 twice daily Pulmicort 0.5 twice daily Yupelri 175 daily hypertonic saline twice daily per respiratory and Robitussin ?Systolic diastolic heart failure ?Prior to admission not on goal-directed therapy?  Not on ACE?Eventually resume metoprolol 12.5 twice daily already on aspirin ?EF this admission = 40-45% ?A1c 5.4 ?do not aggressively control A1c below 6.0-needs follow-up in the outpatient setting for this-current trends 1 40-1 50 ?Right upper extremity thrombophlebitis ?Stage II pressure injury from prior to admission-turn frequently ? ?DVT prophylaxis:  ?Code Status:  ?Family Communication: d/w Mr. Cleatis Fandrich [phone] @ 03/19/22 ?Disposition:  ?Status is: Inpatient ?Remains inpatient appropriate because:  ? ?need for further rehab--CIR ?  ?Consultants:  ?neurology ? ?Procedures: multiple ? ?Antimicrobials: completed  ? ? ?Subjective: ? ?More awake alert and seems to understand better ?Now moving right upper extremity a little better ? ? ?Objective: ?Vitals:  ? 03/19/22 1104 03/19/22 1322 03/19/22 1504 03/19/22 1627  ?BP:  112/69  110/75  ?Pulse: 78 79 79 77  ?Resp: 20 19 18 20   ?Temp:  97.8 ?F (36.6 ?C)  98.2 ?F (36.8 ?C)  ?TempSrc:  Axillary  Axillary  ?SpO2: 96% 93% 95% 99%  ?Weight:      ?Height:      ? ? ?Intake/Output Summary (Last 24 hours) at 03/19/2022 1654 ?Last data filed at 03/19/2022 1500 ?Gross per 24 hour  ?Intake 2991.46 ml  ?Output 400 ml  ?Net 2591.46 ml  ? ? ?Filed Weights  ?  03/15/22 0500 03/17/22 0500 03/19/22 0521  ?Weight: 79 kg 81.5 kg 80.2 kg  ? ? ?Examination: ? ?Trach collar in place , 6 Shiley  uncuffed ?We will use PMV but his voice is still not strong and he cannot smile properly cannot shoulder shrug ?Does not track well on the right side with some hemineglect ?No ict no pallor ?Poor exam to chest given habitus ?Peg in place ?Deficits on right side seem to be improving  ?Can raise both legs off the bed ?Does follow commands ? ?Data Reviewed: personally reviewed  ? ?CBC ?   ?Component Value Date/Time  ? WBC 14.0 (H) 03/18/2022 0314  ? RBC 5.05 03/18/2022 0314  ? HGB 15.1 03/18/2022 0314  ? HCT 45.9 03/18/2022 0314  ? PLT 474 (H) 03/18/2022 0314  ? MCV 90.9 03/18/2022 0314  ? MCH 29.9 03/18/2022 0314  ? MCHC 32.9 03/18/2022 0314  ? RDW 12.9 03/18/2022 0314  ? LYMPHSABS 1.6 03/18/2022 0314  ? MONOABS 1.1 (H) 03/18/2022 0314  ? EOSABS 0.2 03/18/2022 0314  ? BASOSABS 0.1 03/18/2022 0314  ? ? ?  Latest Ref Rng & Units 03/19/2022  ?  2:26 AM 03/18/2022  ?  3:14 AM 03/17/2022  ?  4:12 AM  ?CMP  ?Glucose 70 - 99 mg/dL 146   134   117    ?BUN 8 - 23 mg/dL 27   26   19     ?Creatinine 0.61 - 1.24 mg/dL 0.67   0.81   0.71    ?Sodium 135 - 145 mmol/L 136   136   140    ?Potassium 3.5 - 5.1 mmol/L 4.4   4.5   4.4    ?Chloride 98 - 111 mmol/L 101   100   101    ?CO2 22 - 32 mmol/L 28   28   31     ?Calcium 8.9 - 10.3 mg/dL 8.7   8.8   9.0    ?Total Protein 6.5 - 8.1 g/dL  6.9   6.7    ?Total Bilirubin 0.3 - 1.2 mg/dL  1.0   0.6    ?Alkaline Phos 38 - 126 U/L  105   96    ?AST 15 - 41 U/L  51   48    ?ALT 0 - 44 U/L  60   48    ? ? ? ?Radiology Studies: ?CT CHEST W CONTRAST ? ?Result Date: 03/18/2022 ?CLINICAL DATA:  Abnormal xray - lung nodule, < 1 cm, low risk EXAM: CT CHEST WITH CONTRAST TECHNIQUE: Multidetector CT imaging of the chest was performed during intravenous contrast administration. RADIATION DOSE REDUCTION: This exam was performed according to the departmental dose-optimization program which includes automated exposure control, adjustment of the mA and/or kV according to patient size and/or use of iterative  reconstruction technique. CONTRAST:  27mL OMNIPAQUE IOHEXOL 300 MG/ML  SOLN COMPARISON:  Chest radiograph 03/17/2022 FINDINGS: Cardiovascular: Extensive coronary artery calcification. Global cardiac size within normal limits. No pericardial effusion. Central pulmonary arteries are of normal caliber. Mild predominantly atheromatous plaque within the descending thoracic aorta. No aortic aneurysm. Mediastinum/Nodes: Tracheostomy in expected position. Necrotic left hilar lymph node noted measuring 14 mm in short axis diameter, axial image # 79/3. No additional frankly pathologic thoracic adenopathy is identified though several shotty aortopulmonary and subcarinal lymph nodes noted. The esophagus is unremarkable., Lungs/Pleura: There is moderate to severe emphysema. Left basilar atelectasis or parenchymal scarring is present. Lobulated, spiculated mass is seen within the left upper lobe suspicious  for a primary bronchogenic neoplasm measuring 2.7 x 3.7 cm at axial image # 76/4. An additional lobulated, spiculated nodule is seen within the posterior segment of the right upper lobe measuring 16 x 20 mm at axial image # 85/4 most in keeping with a synchronous primary neoplasm. Mean 13 mm rounded nonspecific nodule is seen within the left lower lobe, axial image # 114/4. Tubular opacity within the subpleural right lower lobe at axial image # 127/4 is most in keeping with an impacted airway. No pneumothorax or pleural effusion. Upper Abdomen: Gastrostomy catheter is partially visualized. No acute abnormality. Musculoskeletal: No lytic or blastic bone lesions. No acute bone abnormality. IMPRESSION: Bilateral upper lobe lobulated/spiculated masses suspicious for synchronous bronchogenic neoplasm. PET CT examination is recommended for further evaluation. Associated left hilar pathologic adenopathy. 13 mm indeterminate rounded nodule within the left lower lobe. Further follow-up will be dictated by surveillance imaging for  above-mentioned pulmonary masses. Moderate to severe emphysema. Extensive coronary artery calcification. Aortic Atherosclerosis (ICD10-I70.0) and Emphysema (ICD10-J43.9). Electronically Signed   By: Fidela Salisbury M.D.   On: 03/

## 2022-03-20 DIAGNOSIS — I63512 Cerebral infarction due to unspecified occlusion or stenosis of left middle cerebral artery: Secondary | ICD-10-CM | POA: Diagnosis not present

## 2022-03-20 LAB — GLUCOSE, CAPILLARY
Glucose-Capillary: 120 mg/dL — ABNORMAL HIGH (ref 70–99)
Glucose-Capillary: 125 mg/dL — ABNORMAL HIGH (ref 70–99)
Glucose-Capillary: 127 mg/dL — ABNORMAL HIGH (ref 70–99)
Glucose-Capillary: 128 mg/dL — ABNORMAL HIGH (ref 70–99)
Glucose-Capillary: 137 mg/dL — ABNORMAL HIGH (ref 70–99)
Glucose-Capillary: 144 mg/dL — ABNORMAL HIGH (ref 70–99)

## 2022-03-20 NOTE — Progress Notes (Signed)
?PROGRESS NOTE ? ? ?Todd Estrada  FBX:[id number] DOB: 1961/03/31 DOA: 03/02/2022 ?PCP: Pcp, No  ?Brief Narrative:  ?61 year old white male community dwelling ?Prior left MCA/CVA + petechial hemorrhage, chronic combined CHF ?Known history of stage II pressure injuries ?HTN HLD?  EtOH ? ?Admit 03/02/2022 seizure-like activity + aphasia CTA head neck = occlusion ICA-->thrombectomy with TICI 2 results-extubated transferred to neuro ICU ?Placed on Keppra ?CCM evaluated patient secondary to unresponsiveness, aspiration ?Underwent tracheostomy 03/09/2022  ? ?3/15 admitted for acute L MCA stroke due to L ICA occlusion with first time seizure s/p revascularization of L ICA occlusion ?3/16 Intubated for airway protection; started unasyn; echo LVEF 40-45%, RVSP normal ?3/17 MRI Brain showed acute infarct in left frontal, temporal and parietal lobes with findings worrisome for acute infarct, left vert occluded; CT head did not show hemorrhage; respiratory notes thick secretions,  ?3/18 following some commands on exam, extubated the re-intubated due to hypoxemia, inability to clear secretions ?3/19 self extubated overnight, remains on NRB ?3/21 required reintubation for inability to protect airway and aspiration  ?3/22 bedside perc tracheostomy with bronchoscopy  ?3/24 PEG ?3/26 started on trach collar ?3/30 patient decannulated trach-replaced with cuffless Shiley #6 ? ?Hospital-Problem based course ? ?Encephalopathy 2/2 left MCA stroke + occlusion/thrombectomy 3/15 ?Agitated delirium 3/19 ?Seizure on admission ?Precedex until 3/19- tends to pull out his trach--he is improved ?Continue Keppra 500 twice daily as per neurology ?Continue GDMT atorvastatin 40+ ASA 81+ Plavix 75 hydralazine 10 to 40 mg every 4 as needed systolic >919 ?Started 3/31 seroquel 50 q pm ?Eventual CIR placement-insurance Auth started 3/31 ?Septic shock  2/2 Aspiration PNA ?Hypoxic respiratory failure  ? + trach ?Significant dysphagia status post PEG tube  placement 3/24 [gen surgery] ?Currently on tube feeds at 55, free water 200 q 6 ?SLP to revisit with regards to neurocognitive functioning in addition to diet graduation when able ?Nodular density L side? ?CT chest nodular densities--13 mm LL Lobe ?Will forward to lung team for OP follow up. But Unlikely a candidate for work-up ?Presumed COPD + tobacco ~his stroke ?Nicotine patch--Brovana 15 twice daily Pulmicort 0.5 twice daily Yupelri 175 daily hypertonic saline twice daily per respiratory and Robitussin ?Systolic diastolic heart failure ?not on goal-directed therapy?   Eventually resume metoprolol 12.5 twice daily ?already on aspirin ?EF this admission = 40-45% ?A1c 5.4 ?do not aggressively control A1c below 6.0-needs follow-up in the outpatient setting for this-current trends 1 40-1 50 ?Right upper extremity thrombophlebitis ?Stage II pressure injury from prior to admission-turn frequently ? ?DVT prophylaxis:  ?Code Status:  ?Family Communication: d/w Mr. Milam Allbaugh [phone] @ 03/19/22 ?Disposition:  ?Status is: Inpatient ?Remains inpatient appropriate because:  ? ?need for further rehab--CIR ?  ?Consultants:  ?neurology ? ?Procedures: multiple ? ?Antimicrobials: completed  ? ? ?Subjective: ? ?More awake alert and seems to understand better ?Interactive--pulls at things periodically ? ? ?Objective: ?Vitals:  ? 03/20/22 1253 03/20/22 1309 03/20/22 1514 03/20/22 1612  ?BP: 113/73   118/72  ?Pulse: 87  80 85  ?Resp: _0 ?Temp: 97.9 ?F (36.6 ?C)   98.1 ?F (36.7 ?C)  ?TempSrc: Axillary   Axillary  ?SpO2: 96% 96% 96% 97%  ?Weight:      ?Height:      ? ? ?Intake/Output Summary (Last 24 hours) at 03/20/2022 1742 ?Last data filed at 03/20/2022 1100 ?Gross per 24 hour  ?Intake 2624.94 ml  ?Output 1625 ml  ?Net 999.94 ml  ? ? ?Filed Weights  ? 03/17/22  0500 03/19/22 0521 03/20/22 0500  ?Weight: 81.5 kg 80.2 kg 81.1 kg  ? ? ?Examination: ? ?Trach collar in place , 6 Shiley uncuffed ?Chest coarse bs ?Thick secretions  at times ?Does not track well on the right side with some hemineglect ?No ict no pallor ?Peg in place ?Deficits on right side seem to be improving  ?Can raise both legs off the bed ? ? ?Data Reviewed: personally reviewed  ? ?CBC ?   ?Component Value Date/Time  ? WBC 14.0 (H) 03/18/2022 0314  ? RBC 5.05 03/18/2022 0314  ? HGB 15.1 03/18/2022 0314  ? HCT 45.9 03/18/2022 0314  ? PLT 474 (H) 03/18/2022 0314  ? MCV 90.9 03/18/2022 0314  ? MCH 29.9 03/18/2022 0314  ? MCHC 32.9 03/18/2022 0314  ? RDW 12.9 03/18/2022 0314  ? LYMPHSABS 1.6 03/18/2022 0314  ? MONOABS 1.1 (H) 03/18/2022 0314  ? EOSABS 0.2 03/18/2022 0314  ? BASOSABS 0.1 03/18/2022 0314  ? ? ?  Latest Ref Rng & Units 03/19/2022  ?  2:26 AM 03/18/2022  ?  3:14 AM 03/17/2022  ?  4:12 AM  ?CMP  ?Glucose 70 - 99 mg/dL 146   134   117    ?BUN 8 - 23 mg/dL _0 ?Creatinine 0.61 - 1.24 mg/dL 0.67   0.81   0.71    ?Sodium 135 - 145 mmol/L 136   136   140    ?Potassium 3.5 - 5.1 mmol/L 4.4   4.5   4.4    ?Chloride 98 - 111 mmol/L 101   100   101    ?CO2 22 - 32 mmol/L _1 ?Calcium 8.9 - 10.3 mg/dL 8.7   8.8   9.0    ?Total Protein 6.5 - 8.1 g/dL  6.9   6.7    ?Total Bilirubin 0.3 - 1.2 mg/dL  1.0   0.6    ?Alkaline Phos 38 - 126 U/L  105   96    ?AST 15 - 41 U/L  51   48    ?ALT 0 - 44 U/L  60   48    ? ? ? ?Radiology Studies: ?No results found. ? ? ?Scheduled Meds: ? arformoterol  15 mcg Nebulization BID  ? aspirin  81 mg Per Tube Daily  ? atorvastatin  40 mg Per Tube Daily  ? budesonide (PULMICORT) nebulizer solution  0.5 mg Nebulization BID  ? chlorhexidine gluconate (MEDLINE KIT)  15 mL Mouth Rinse BID  ? Chlorhexidine Gluconate Cloth  6 each Topical Q0600  ? clopidogrel  75 mg Per Tube Daily  ? docusate  100 mg Per Tube BID  ? enoxaparin (LOVENOX) injection  40 mg Subcutaneous Q24H  ? feeding supplement (PROSource TF)  90 mL Per Tube BID  ? folic acid  1 mg Per Tube Daily  ? free water  200 mL Per Tube Q6H  ? glycopyrrolate  1 mg Oral BID  ?  guaiFENesin  15 mL Per Tube Q6H  ? levETIRAcetam  500 mg Per Tube BID  ? mouth rinse  15 mL Mouth Rinse QID  ? multivitamin with minerals  1 tablet Per Tube Daily  ? nicotine  14 mg Transdermal Daily  ? pantoprazole sodium  40 mg Per Tube Q24H  ? QUEtiapine  25 mg Oral QHS  ? revefenacin  175 mcg Nebulization Daily  ? thiamine  100  mg Per Tube Daily  ? ?Continuous Infusions: ? sodium chloride 10 mL/hr at 03/14/22 0600  ? sodium chloride Stopped (03/15/22 0904)  ? sodium chloride 50 mL/hr at 03/20/22 1100  ? feeding supplement (OSMOLITE 1.5 CAL) 1,000 mL (03/19/22 1832)  ? ? ? LOS: 18 days  ? ?Time spent: 37 minutes ? ?Nita Sells, MD ?[facility] ?To contact the attending provider between 7A-7P or the covering provider during after hours 7P-7A, please log into the web site [email or web address] and access using universal [place] password for that web site. If you do not have the password, please call the hospital operator. ? ?03/20/2022, 5:42 PM  ? ? ?

## 2022-03-20 NOTE — Plan of Care (Signed)
?  Problem: Safety: ?Goal: Non-violent Restraint(s) ?Outcome: Progressing ?  ?Problem: Education: ?Goal: Knowledge of disease or condition will improve ?Outcome: Progressing ?Goal: Knowledge of secondary prevention will improve (SELECT ALL) ?Outcome: Progressing ?Goal: Knowledge of patient specific risk factors will improve (INDIVIDUALIZE FOR PATIENT) ?Outcome: Progressing ?Goal: Individualized Educational Video(s) ?Outcome: Progressing ?  ?Problem: Coping: ?Goal: Will verbalize positive feelings about self ?Outcome: Progressing ?Goal: Will identify appropriate support needs ?Outcome: Progressing ?  ?Problem: Health Behavior/Discharge Planning: ?Goal: Ability to manage health-related needs will improve ?Outcome: Progressing ?  ?Problem: Self-Care: ?Goal: Ability to participate in self-care as condition permits will improve ?Outcome: Progressing ?Goal: Verbalization of feelings and concerns over difficulty with self-care will improve ?Outcome: Progressing ?Goal: Ability to communicate needs accurately will improve ?Outcome: Progressing ?  ?Problem: Nutrition: ?Goal: Risk of aspiration will decrease ?Outcome: Progressing ?Goal: Dietary intake will improve ?Outcome: Progressing ?  ?Problem: Ischemic Stroke/TIA Tissue Perfusion: ?Goal: Complications of ischemic stroke/TIA will be minimized ?Outcome: Progressing ?  ?

## 2022-03-20 NOTE — Progress Notes (Addendum)
Patient continues to be intermittently restless, pulling lines, reaches out to his trach collar. Education attempted; no evidence of learning. ? ?L soft wrist and soft waist belt restarined continued; order renewed by MD.  ?

## 2022-03-21 DIAGNOSIS — I63512 Cerebral infarction due to unspecified occlusion or stenosis of left middle cerebral artery: Secondary | ICD-10-CM | POA: Diagnosis not present

## 2022-03-21 LAB — GLUCOSE, CAPILLARY
Glucose-Capillary: 118 mg/dL — ABNORMAL HIGH (ref 70–99)
Glucose-Capillary: 129 mg/dL — ABNORMAL HIGH (ref 70–99)
Glucose-Capillary: 133 mg/dL — ABNORMAL HIGH (ref 70–99)
Glucose-Capillary: 140 mg/dL — ABNORMAL HIGH (ref 70–99)
Glucose-Capillary: 144 mg/dL — ABNORMAL HIGH (ref 70–99)
Glucose-Capillary: 164 mg/dL — ABNORMAL HIGH (ref 70–99)

## 2022-03-21 MED ORDER — POLYETHYLENE GLYCOL 3350 17 G PO PACK: 17.0000 g | PACK | Freq: Every day | ORAL | 0 refills | Status: DC | PRN

## 2022-03-21 MED ORDER — REVEFENACIN 175 MCG/3ML IN SOLN: 175.0000 ug | Freq: Every day | RESPIRATORY_TRACT | Status: DC

## 2022-03-21 MED ORDER — GLYCOPYRROLATE 1 MG PO TABS
1.0000 mg | ORAL_TABLET | Freq: Two times a day (BID) | ORAL | 0 refills | Status: DC
Start: 2022-03-21 — End: 2022-04-15

## 2022-03-21 MED ORDER — ARFORMOTEROL TARTRATE 15 MCG/2ML IN NEBU: 15.0000 ug | INHALATION_SOLUTION | Freq: Two times a day (BID) | RESPIRATORY_TRACT | 0 refills | Status: DC

## 2022-03-21 MED ORDER — QUETIAPINE FUMARATE 25 MG PO TABS: 25.0000 mg | ORAL_TABLET | Freq: Every day | ORAL | 0 refills | Status: DC

## 2022-03-21 MED ORDER — BUDESONIDE 0.5 MG/2ML IN SUSP: 0.5000 mg | Freq: Two times a day (BID) | RESPIRATORY_TRACT | 12 refills | Status: DC

## 2022-03-21 MED ORDER — LEVETIRACETAM 100 MG/ML PO SOLN: 500.0000 mg | Freq: Two times a day (BID) | ORAL | 0 refills | Status: DC

## 2022-03-21 MED ORDER — CLOPIDOGREL BISULFATE 75 MG PO TABS: 75.0000 mg | ORAL_TABLET | Freq: Every day | ORAL | Status: DC

## 2022-03-21 MED ORDER — FOLIC ACID 1 MG PO TABS: 1.0000 mg | ORAL_TABLET | Freq: Every day | ORAL | 0 refills | Status: DC

## 2022-03-21 MED ORDER — FREE WATER: 200.0000 mL | Freq: Four times a day (QID) | 0 refills | Status: DC

## 2022-03-21 MED ORDER — PANTOPRAZOLE SODIUM 40 MG PO PACK: 40.0000 mg | PACK | ORAL | 0 refills | Status: DC

## 2022-03-21 MED ORDER — THIAMINE HCL 100 MG PO TABS: 100.0000 mg | ORAL_TABLET | Freq: Every day | ORAL | 0 refills | Status: DC

## 2022-03-21 NOTE — Progress Notes (Signed)
Patient will probably be able to discharge to CIR tomorrow ?I have seen him and I think that he is medically stable-we will get a be met and discontinue his fluids today and adjust his free water intake ?He has a little bit of bruising on his bottom but he does not have any sacral decubiti ?I think he will do very well at rehab as long as he has follow-up ?I have updated the patient's son [name] [phone] ? ?Verneita Griffes, MD ?[facility] ?5:10 PM ? ?

## 2022-03-21 NOTE — Progress Notes (Signed)
Physical Therapy Treatment ?Patient Details ?Name: Todd Estrada ?MRN: [id number] ?DOB: 12/04/1961 ?Today's Date: 03/21/2022 ? ? ?History of Present Illness 61 y/o male with a history of Left MCA stroke in 2947 with a complicated post stroke course presented on 3/15 with a 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  He was extubated following procedure, but reintubated due to respiratory failure.  Self extubated overnight 3/19,  Re-intubated on 3/21 and bronchoscopy and tracheostomy performed on 3/22; PEG on 3/24. ? ?  ?PT Comments  ? ? Pt not able to ambulate as well today, but was limited due to frequent bowel movements (required cleaning twice during session and was just cleaned prior to session) and increased lethargy today.  Pt was still able to participate and followed 50% of commands.  Required increased time to respond , cues, and varied levels of assist (min A bed mobility but mod A of 2 for OOB).  Continue plan of care.  ?   ?Recommendations for follow up therapy are one component of a multi-disciplinary discharge planning process, led by the attending physician.  Recommendations may be updated based on patient status, additional functional criteria and insurance authorization. ? ?Follow Up Recommendations ? Acute inpatient rehab (3hours/day) ?  ?  ?Assistance Recommended at Discharge Frequent or constant Supervision/Assistance  ?Patient can return home with the following Two people to help with walking and/or transfers;Two people to help with bathing/dressing/bathroom;Assistance with feeding;Assistance with cooking/housework;Direct supervision/assist for medications management;Assist for transportation;Help with stairs or ramp for entrance;Direct supervision/assist for financial management ?  ?Equipment Recommendations ? Wheelchair (measurements PT);Wheelchair cushion (measurements PT);Hospital bed;BSC/3in1  ?  ?Recommendations for Other Services   ? ? ?  ?Precautions / Restrictions  Precautions ?Precautions: Fall ?Precaution Comments: posey belt, wrist restraint on L, Bil mitts, R inattention,  ?  ? ?Mobility ? Bed Mobility ?Overal bed mobility: Needs Assistance ?Bed Mobility: Supine to Sit, Sit to Supine ?  ?  ?Supine to sit: Min assist ?Sit to supine: Min assist ?  ?General bed mobility comments: Cues for safety and assist for lines ?  ? ?Transfers ?Overall transfer level: Needs assistance ?Equipment used: 2 person hand held assist, Rolling walker (2 wheels) ?Transfers: Sit to/from Stand, Bed to chair/wheelchair/BSC ?Sit to Stand: Mod assist ?Stand pivot transfers: Mod assist, +2 physical assistance ?Step pivot transfers: Mod assist, +2 physical assistance ?  ?  ?  ?General transfer comment: Performed sit to stand x 6 during session - attempted with RW but R hand unable to maintain grip so switched to HHA.  Step pivot to [facility] but R leg buckling and pt requiring max cues and mod A to maintain balance.  Stand pivot back to bed mod A of 2 for safety ?  ? ?Ambulation/Gait ?Ambulation/Gait assistance: Mod assist, +2 physical assistance ?Gait Distance (Feet): 3 Feet ?Assistive device: 2 person hand held assist ?Gait Pattern/deviations: Step-to pattern, Decreased stride length ?  ?  ?  ?General Gait Details: Side steps at EOB but pt fatigues easily, legs buckling, requiring mod A x 2 and max cues ? ? ?Stairs ?  ?  ?  ?  ?  ? ? ?Wheelchair Mobility ?  ? ?Modified Rankin (Stroke Patients Only) ?Modified Rankin (Stroke Patients Only) ?Pre-Morbid Rankin Score: Slight disability ?Modified Rankin: Moderately severe disability ? ? ?  ?Balance Overall balance assessment: Needs assistance ?Sitting-balance support: Feet supported, No upper extremity supported ?Sitting balance-Leahy Scale: Fair ?Sitting balance - Comments: EOB with close supervision ?  ?  Standing balance support: Single extremity supported ?Standing balance-Leahy Scale: Poor ?Standing balance comment: Requiring UE support on L and min static,  mod A of 2 dynamic balance.  Stood multiple times for ADLs but fatigued easily -only able to stand 10-20 sec at a time ?  ?  ?  ?  ?  ?  ?  ?  ?  ?  ?  ?  ? ?  ?Cognition Arousal/Alertness: Lethargic ?Behavior During Therapy: Flat affect ?Overall Cognitive Status: No family/caregiver present to determine baseline cognitive functioning ?  ?  ?  ?  ?  ?  ?  ?  ?  ?  ?  ?  ?  ?  ?  ?  ?General Comments: Pt following 50% of basic commands; flat affect and quiet today - tech reports pt was much more talkative and alert last session ?  ?  ? ?  ?Exercises   ? ?  ?General Comments General comments (skin integrity, edema, etc.): VSS on trach collar; Pt having frequent BMs requiring cleaning multiple times.  Also, reports fatigue and wanting to sleep ?  ?  ? ?Pertinent Vitals/Pain Pain Assessment ?Pain Assessment: No/denies pain  ? ? ?Home Living   ?  ?  ?  ?  ?  ?  ?  ?  ?  ?   ?  ?Prior Function    ?  ?  ?   ? ?PT Goals (current goals can now be found in the care plan section) Progress towards PT goals: Progressing toward goals ? ?  ?Frequency ? ? ? Min 4X/week ? ? ? ?  ?PT Plan Current plan remains appropriate  ? ? ?Co-evaluation   ?  ?  ?  ?  ? ?  ?AM-PAC PT "6 Clicks" Mobility   ?Outcome Measure ? Help needed turning from your back to your side while in a flat bed without using bedrails?: A Little ?Help needed moving from lying on your back to sitting on the side of a flat bed without using bedrails?: A Little ?Help needed moving to and from a bed to a chair (including a wheelchair)?: Total ?Help needed standing up from a chair using your arms (e.g., wheelchair or bedside chair)?: Total ?Help needed to walk in hospital room?: Total ?Help needed climbing 3-5 steps with a railing? : Total ?6 Click Score: 10 ? ?  ?End of Session Equipment Utilized During Treatment: Oxygen;Gait belt ?Activity Tolerance: Patient tolerated treatment well ?Patient left: in bed;with restraints reapplied;with call bell/phone within reach;with bed  alarm set (mittens, L wrist, and waist restriant) ?Nurse Communication: Mobility status ?PT Visit Diagnosis: Other abnormalities of gait and mobility (R26.89);Other symptoms and signs involving the nervous system (R29.898);Hemiplegia and hemiparesis ?Hemiplegia - Right/Left: Right ?Hemiplegia - dominant/non-dominant: Non-dominant ?Hemiplegia - caused by: Cerebral infarction ?  ? ? ?Time: 5916-3846 ?PT Time Calculation (min) (ACUTE ONLY): 42 min ? ?Charges:  $Therapeutic Activity: 23-37 mins ?$Neuromuscular Re-education: 8-22 mins          ?          ? ?Abran Richard, PT ?Acute Rehab Services ?Pager [phone] ?[name] [phone] ? ? ? ?Mikael Spray [place] ?03/21/2022, 5:33 PM ? ?

## 2022-03-21 NOTE — Progress Notes (Signed)
Inpatient Rehab Admissions Coordinator:  ? ?Awaiting determination from [employer] regarding CIR prior auth request.  Will continue to follow.  ? ?Shann Medal, PT, DPT ?Admissions Coordinator ?[phone] ?03/21/22  ?1:00 PM ? ?

## 2022-03-21 NOTE — Discharge Summary (Signed)
?Physician Discharge Summary ?  ?Patient: Todd Estrada MRN: [id number] DOB: Jun 07, 1961  ?Admit date:     03/02/2022  ?Discharge date: 03/21/22  ?Discharge Physician: Nita Sells  ? ?PCP: Pcp, No  ? ?Recommendations at discharge:  ? ?Requires Chem-12 CBC in about 1 week ?Would recommend not aggressively controlling blood sugar given A1c 5.4-May need a discussion of this in the outpatient setting ?Patient has probable lung cancer-given his large stroke delirium etc. I do not know if he is a good candidate for repeat CT or work-up of this-he has a high risk of aspiration pneumonia and morbidity-please discuss this on follow-up with PCP in the outpatient setting depending on his clinical course at rehab ?Patient will need to follow-up in trach clinic for downsizing of trach-I will CC Mr. Marni Griffon to coordinate the same  ?will need all therapies including neurocognitive at CIR ? ? ?Discharge Diagnoses: ?Principal Problem: ?  Acute ischemic left MCA stroke ([place]) ?Active Problems: ?  Internal carotid artery occlusion, left ?  Pressure injury of skin ?  Malnutrition of moderate degree ? ?Resolved Problems: ?  * No resolved hospital problems. * ? ?Hospital Course: ?61 year old white male community dwelling ?Prior left MCA/CVA + petechial hemorrhage, chronic combined CHF ?Known history of stage II pressure injuries ?HTN HLD?  EtOH ?  ?Admit 03/02/2022 seizure-like activity + aphasia CTA head neck = occlusion ICA-->thrombectomy with TICI 2 results-extubated transferred to neuro ICU ?Placed on Keppra ?CCM evaluated patient secondary to unresponsiveness, aspiration ?Underwent tracheostomy 03/09/2022  ?  ?3/15 admitted for acute L MCA stroke due to L ICA occlusion with first time seizure s/p revascularization of L ICA occlusion ?3/16 Intubated for airway protection; started unasyn; echo LVEF 40-45%, RVSP normal ?3/17 MRI Brain showed acute infarct in left frontal, temporal and parietal lobes with findings worrisome for  acute infarct, left vert occluded; CT head did not show hemorrhage; respiratory notes thick secretions,  ?3/18 following some commands on exam, extubated the re-intubated due to hypoxemia, inability to clear secretions ?3/19 self extubated overnight, remains on NRB ?3/21 required reintubation for inability to protect airway and aspiration  ?3/22 bedside perc tracheostomy with bronchoscopy  ?3/24 PEG ?3/26 started on trach collar ?3/30 patient decannulated trach-replaced with cuffless Shiley #6 ?Accepted for CIR placement 4/3 hopeful for discharge 4/4  ?  ?Hospital-Problem based course ?  ?Encephalopathy 2/2 left MCA stroke + occlusion/thrombectomy 3/15 ?Agitated delirium 3/19 ?Seizure on admission ?Precedex until 3/19- tends to pull out his trach--he is improved ?Continue Keppra 500 twice daily as per neurology ?Continue GDMT atorvastatin 40+ ASA 81+ Plavix 75 hydralazine 10 to 40 mg every 4 as needed systolic >993 ?Started 3/31 seroquel 50 q pm ?Eventual CIR placement hopefully in a.m. 4/4  ?septic shock  2/2 Aspiration PNA ?Hypoxic respiratory failure  ? + trach ?Significant dysphagia status post PEG tube placement 3/24 [gen surgery] ?Currently on tube feeds at 55, free water 200 q 6 ?SLP to revisit with regards to neurocognitive functioning in addition to diet graduation when able ?This will be followed in the outpatient trach clinic and I will forward to the responsible parties regarding the same ?Nodular density L side? ?CT chest nodular densities--13 mm LL Lobe ?Will forward to lung team for OP follow up. But Unlikely a candidate for work-up if he does not recover significantly ?Presumed COPD + tobacco ~his stroke ?Nicotine patch--Brovana 15 twice daily Pulmicort 0.5 twice daily Yupelri 175 daily hypertonic saline twice daily per respiratory and Robitussin ?Systolic diastolic heart  failure ?not on goal-directed therapy?   Eventually resume metoprolol 12.5 twice daily ?already on aspirin ?EF this admission =  40-45% ?A1c 5.4 ?do not aggressively control A1c below 6.0-needs follow-up in the outpatient setting for this-current trends 1 40-1 50 ?Right upper extremity thrombophlebitis ?Stage II pressure injury from prior to admission-turn frequently ? ? ? ?  ? ? ?Consultants: Pulmonology multiple ?Procedures performed: As above ?Disposition: Rehabilitation facility ?Diet recommendation:  ?Discharge Diet Orders (From admission, onward)  ? ?  Start     Ordered  ? 03/21/22 0000  Diet - low sodium heart healthy       ? 03/21/22 1657  ? ?  ?  ? ?  ? ?NPO Will need to be on tube feedsfor the time being ?DISCHARGE MEDICATION: ?Allergies as of 03/21/2022   ?No Known Allergies ?  ? ?  ?Medication List  ?  ? ?STOP taking these medications   ? ?metoprolol tartrate 25 MG tablet ?Commonly known as: LOPRESSOR ?  ? ?  ? ?TAKE these medications   ? ?arformoterol 15 MCG/2ML Nebu ?Commonly known as: BROVANA ?Take 2 mLs (15 mcg total) by nebulization 2 (two) times daily. ?  ?aspirin 81 MG EC tablet ?Take 1 tablet (81 mg total) by mouth daily. Swallow whole. ?  ?atorvastatin 80 MG tablet ?Commonly known as: LIPITOR ?Take 1 tablet (80 mg total) by mouth daily. ?  ?budesonide 0.5 MG/2ML nebulizer solution ?Commonly known as: PULMICORT ?Take 2 mLs (0.5 mg total) by nebulization 2 (two) times daily. ?  ?clopidogrel 75 MG tablet ?Commonly known as: PLAVIX ?Place 1 tablet (75 mg total) into feeding tube daily. ?Start taking on: March 22, 3006 ?  ?folic acid 1 MG tablet ?Commonly known as: FOLVITE ?Place 1 tablet (1 mg total) into feeding tube daily. ?Start taking on: March 22, 2022 ?  ?free water Soln ?Place 200 mLs into feeding tube every 6 (six) hours. ?  ?glycopyrrolate 1 MG tablet ?Commonly known as: ROBINUL ?Take 1 tablet (1 mg total) by mouth 2 (two) times daily. ?  ?levETIRAcetam 100 MG/ML solution ?Commonly known as: KEPPRA ?Place 5 mLs (500 mg total) into feeding tube 2 (two) times daily. ?  ?pantoprazole sodium 40 mg ?Commonly known as:  PROTONIX ?Place 40 mg into feeding tube daily. ?  ?polyethylene glycol 17 g packet ?Commonly known as: MIRALAX / GLYCOLAX ?Place 17 g into feeding tube daily as needed for moderate constipation. ?  ?QUEtiapine 25 MG tablet ?Commonly known as: SEROQUEL ?Take 1 tablet (25 mg total) by mouth at bedtime. ?  ?revefenacin 175 MCG/3ML nebulizer solution ?Commonly known as: YUPELRI ?Take 3 mLs (175 mcg total) by nebulization daily. ?Start taking on: March 22, 2022 ?  ?thiamine 100 MG tablet ?Place 1 tablet (100 mg total) into feeding tube daily. ?Start taking on: March 22, 2022 ?  ? ?  ? ? ?Discharge Exam: ?Filed Weights  ? 03/19/22 0521 03/20/22 0500 03/21/22 0500  ?Weight: 80.2 kg 81.1 kg 81.3 kg  ? ?Awake coherent and understands however speech limited by PMV/trach ?He is moving his right upper extremity much better than he did several days ago ?He motions no when I ask him if he is in pain ? ?EOMI NCAT thick compared poor dentition dry mucosa ?S1-S2 sinus rhythm, #6 uncuffed trach in place some secretions noted ?Abdomen is soft PEG tube in place ?No lower extremity edema ?ROM intact moving 4 limbs equally ?Psych euthymic coherent although difficult to examine given limitations of the language ? ?  Condition at discharge: fair ? ?The results of significant diagnostics from this hospitalization (including imaging, microbiology, ancillary and laboratory) are listed below for reference.  ? ?Imaging Studies: ?DG Abd 1 View ? ?Result Date: 03/04/2022 ?CLINICAL DATA:  Stroke Pre MRI screening EXAM: ABDOMEN - 1 VIEW COMPARISON:  None. FINDINGS: No dilated loops of bowel to indicate ileus or obstruction. Extensive atherosclerotic arterial calcifications are noted. Mild degenerative changes present lower lumbar spine. Enteric tube extensive left upper quadrant in the expected site of the stomach. No abnormal metallic densities are identified, however evaluation is limited due to multiple overlying external artifacts. IMPRESSION: No  metallic density foreign body identified within the visualized abdomen. Electronically Signed   By: Miachel Roux M.D.   On: 03/04/2022 13:32  ? ?CT HEAD WO CONTRAST (5MM) ? ?Result Date: 03/05/2022 ?CLINICAL DATA:

## 2022-03-21 NOTE — Progress Notes (Signed)
Inpatient Rehab Admissions Coordinator:  ? ?Insurance has approved inpatient rehab. I do not have a bed today but will f/u tomorrow for potential admit pending bed availability.  ? ?Shann Medal, PT, DPT ?Admissions Coordinator ?[phone] ?03/21/22  ?4:00 PM ? ?

## 2022-03-21 NOTE — Progress Notes (Signed)
Speech Language Pathology Treatment: Dysphagia;Passy Muir Speaking valve  ?Patient Details ?Name: Todd Estrada ?MRN: [id number] ?DOB: 09/18/1961 ?Today's Date: 03/21/2022 ?Time: 1030-1100 ?SLP Time Calculation (min) (ACUTE ONLY): 30 min ? ?Assessment / Plan / Recommendation ?Clinical Impression ? Todd Estrada was seen at bedside for skilled SLP services targeting PMSV and dysphagia goals in functional manner. Pt was alert and cooperative throughout the entirety of the session, however easily distractible (L visual field) and required mod cueing. Pt coughing with secretions observed at the tracheal level prior to PMSV donning. SLP placed teal PMSV for the entirety of today's session. Pt was able to redirect subglottic airflow in order to achieve phonation at the word level. Pt intermittently used "yes/no" vocalizations. Pt made no spontaneous verbalizations at the phrase, sentence or conversation levels. Pt most frequently used mumbling or head nods (yes/no) in order to communicate with clinicians. Pt was able to answer yes/no questions in regards to wants/needs with 100% accuracy. SLP attempted to branch to environmental and abstract yes/no questions and pt's accuracy decreased to ~50%. Yes/no questions are not reliable forms of communication at this time. SLP recommends placing PMSV during all waking hours, as pt is tolerating it. PMSV during waking hours may also aid in pt's management of secretions. ? ?In the area of feeding and swallowing, pt observed with individual sips of thin liquid via straw cup. Pt required mod assist in order to facilitate assisted self-feeding. Pt accepted ~3 individual straw sips of thin liquid with immediate cough observed. SLP branched down to NTL and pt did not display overt s/s of aspiration with NTL via straw cup. Pt continued to exhibit success with puree applesauce; no overt s/s of aspiration observed with puree textures. Pt will need further instrumental testing (MBS) before  initiating diet recommendations. SLP to complete MBS next date. ?  ?HPI HPI: 61 y/o male with a history of Left MCA stroke in 9528 with a complicated post stroke course presented on 3/15 with a 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  He was extubated following procedure, but reintubated due to respiratory failure.  Self extubated overnight 3/19, now on venturi mask.  Re-intubated on 3/21 and bronchoscopy and tracheostomy performed on 3/22. Prior CVA resulted in mild to moderate aphasia. ?  ?   ?SLP Plan ? Continue with current plan of care ? ?  ?  ?Recommendations for follow up therapy are one component of a multi-disciplinary discharge planning process, led by the attending physician.  Recommendations may be updated based on patient status, additional functional criteria and insurance authorization. ?  ? ?Recommendations  ?Diet recommendations: NPO  ?   ? Patient may use Passy-Muir Speech Valve: During all waking hours (remove during sleep) ?PMSV Supervision: Intermittent  ?   ? ? ? ? Oral Care Recommendations: Oral care before and after PO ?Follow Up Recommendations: Acute inpatient rehab (3hours/day) ?Assistance recommended at discharge: Intermittent Supervision/Assistance ?SLP Visit Diagnosis: Aphonia (R49.1);Dysphagia, unspecified (R13.10) ?Plan: Continue with current plan of care ? ? ? ? ?  ?  ? ? ?Vaughan Sine ? ?03/21/2022, 12:03 PM ?

## 2022-03-22 ENCOUNTER — Inpatient Hospital Stay (HOSPITAL_COMMUNITY)
Admission: RE | Admit: 2022-03-22 | Discharge: 2022-04-15 | DRG: 056 | Disposition: A | Discharge status: Home Health | Payer: Medicaid Other | Source: Intra-hospital | Attending: Physical Medicine & Rehabilitation | Admitting: Physical Medicine & Rehabilitation

## 2022-03-22 ENCOUNTER — Inpatient Hospital Stay (HOSPITAL_COMMUNITY): Payer: Medicaid Other

## 2022-03-22 ENCOUNTER — Encounter (HOSPITAL_COMMUNITY): Payer: Self-pay | Admitting: Physical Medicine & Rehabilitation

## 2022-03-22 ENCOUNTER — Other Ambulatory Visit: Payer: Self-pay

## 2022-03-22 DIAGNOSIS — I5042 Chronic combined systolic (congestive) and diastolic (congestive) heart failure: Secondary | ICD-10-CM | POA: Diagnosis present

## 2022-03-22 DIAGNOSIS — Z7982 Long term (current) use of aspirin: Secondary | ICD-10-CM

## 2022-03-22 DIAGNOSIS — H539 Unspecified visual disturbance: Secondary | ICD-10-CM | POA: Diagnosis present

## 2022-03-22 DIAGNOSIS — N390 Urinary tract infection, site not specified: Secondary | ICD-10-CM

## 2022-03-22 DIAGNOSIS — I69398 Other sequelae of cerebral infarction: Secondary | ICD-10-CM

## 2022-03-22 DIAGNOSIS — J439 Emphysema, unspecified: Secondary | ICD-10-CM | POA: Diagnosis present

## 2022-03-22 DIAGNOSIS — I110 Hypertensive heart disease with heart failure: Secondary | ICD-10-CM | POA: Diagnosis present

## 2022-03-22 DIAGNOSIS — G40909 Epilepsy, unspecified, not intractable, without status epilepticus: Secondary | ICD-10-CM | POA: Diagnosis present

## 2022-03-22 DIAGNOSIS — I808 Phlebitis and thrombophlebitis of other sites: Secondary | ICD-10-CM | POA: Diagnosis present

## 2022-03-22 DIAGNOSIS — L97519 Non-pressure chronic ulcer of other part of right foot with unspecified severity: Secondary | ICD-10-CM | POA: Diagnosis present

## 2022-03-22 DIAGNOSIS — I70201 Unspecified atherosclerosis of native arteries of extremities, right leg: Secondary | ICD-10-CM | POA: Diagnosis present

## 2022-03-22 DIAGNOSIS — I69351 Hemiplegia and hemiparesis following cerebral infarction affecting right dominant side: Principal | ICD-10-CM

## 2022-03-22 DIAGNOSIS — L0390 Cellulitis, unspecified: Secondary | ICD-10-CM | POA: Diagnosis not present

## 2022-03-22 DIAGNOSIS — G9340 Encephalopathy, unspecified: Secondary | ICD-10-CM | POA: Diagnosis not present

## 2022-03-22 DIAGNOSIS — Z781 Physical restraint status: Secondary | ICD-10-CM

## 2022-03-22 DIAGNOSIS — I739 Peripheral vascular disease, unspecified: Secondary | ICD-10-CM | POA: Diagnosis not present

## 2022-03-22 DIAGNOSIS — R1310 Dysphagia, unspecified: Secondary | ICD-10-CM | POA: Diagnosis present

## 2022-03-22 DIAGNOSIS — R609 Edema, unspecified: Secondary | ICD-10-CM | POA: Diagnosis not present

## 2022-03-22 DIAGNOSIS — I63512 Cerebral infarction due to unspecified occlusion or stenosis of left middle cerebral artery: Secondary | ICD-10-CM

## 2022-03-22 DIAGNOSIS — K219 Gastro-esophageal reflux disease without esophagitis: Secondary | ICD-10-CM | POA: Diagnosis present

## 2022-03-22 DIAGNOSIS — I69322 Dysarthria following cerebral infarction: Secondary | ICD-10-CM

## 2022-03-22 DIAGNOSIS — R319 Hematuria, unspecified: Secondary | ICD-10-CM | POA: Diagnosis not present

## 2022-03-22 DIAGNOSIS — E785 Hyperlipidemia, unspecified: Secondary | ICD-10-CM | POA: Diagnosis not present

## 2022-03-22 DIAGNOSIS — I69391 Dysphagia following cerebral infarction: Secondary | ICD-10-CM | POA: Diagnosis not present

## 2022-03-22 DIAGNOSIS — L97529 Non-pressure chronic ulcer of other part of left foot with unspecified severity: Secondary | ICD-10-CM | POA: Diagnosis present

## 2022-03-22 DIAGNOSIS — J9621 Acute and chronic respiratory failure with hypoxia: Secondary | ICD-10-CM | POA: Diagnosis not present

## 2022-03-22 DIAGNOSIS — J9611 Chronic respiratory failure with hypoxia: Secondary | ICD-10-CM | POA: Diagnosis not present

## 2022-03-22 DIAGNOSIS — Z930 Tracheostomy status: Secondary | ICD-10-CM

## 2022-03-22 DIAGNOSIS — I69390 Apraxia following cerebral infarction: Secondary | ICD-10-CM

## 2022-03-22 DIAGNOSIS — T17890A Other foreign object in other parts of respiratory tract causing asphyxiation, initial encounter: Secondary | ICD-10-CM | POA: Diagnosis not present

## 2022-03-22 DIAGNOSIS — F1010 Alcohol abuse, uncomplicated: Secondary | ICD-10-CM | POA: Diagnosis present

## 2022-03-22 DIAGNOSIS — I69393 Ataxia following cerebral infarction: Secondary | ICD-10-CM

## 2022-03-22 DIAGNOSIS — Z931 Gastrostomy status: Secondary | ICD-10-CM

## 2022-03-22 DIAGNOSIS — I69320 Aphasia following cerebral infarction: Secondary | ICD-10-CM

## 2022-03-22 DIAGNOSIS — Z79899 Other long term (current) drug therapy: Secondary | ICD-10-CM

## 2022-03-22 DIAGNOSIS — F17210 Nicotine dependence, cigarettes, uncomplicated: Secondary | ICD-10-CM | POA: Diagnosis present

## 2022-03-22 DIAGNOSIS — T17998A Other foreign object in respiratory tract, part unspecified causing other injury, initial encounter: Secondary | ICD-10-CM | POA: Diagnosis not present

## 2022-03-22 DIAGNOSIS — Z430 Encounter for attention to tracheostomy: Secondary | ICD-10-CM | POA: Diagnosis not present

## 2022-03-22 DIAGNOSIS — B9620 Unspecified Escherichia coli [E. coli] as the cause of diseases classified elsewhere: Secondary | ICD-10-CM | POA: Diagnosis not present

## 2022-03-22 DIAGNOSIS — Z8673 Personal history of transient ischemic attack (TIA), and cerebral infarction without residual deficits: Secondary | ICD-10-CM | POA: Diagnosis present

## 2022-03-22 LAB — GLUCOSE, CAPILLARY
Glucose-Capillary: 157 mg/dL — ABNORMAL HIGH (ref 70–99)
Glucose-Capillary: 155 mg/dL — ABNORMAL HIGH (ref 70–99)
Glucose-Capillary: 149 mg/dL — ABNORMAL HIGH (ref 70–99)
Glucose-Capillary: 153 mg/dL — ABNORMAL HIGH (ref 70–99)
Glucose-Capillary: 133 mg/dL — ABNORMAL HIGH (ref 70–99)

## 2022-03-22 MED ORDER — ALUM & MAG HYDROXIDE-SIMETH 200-200-20 MG/5ML PO SUSP: 30.0000 mL | ORAL | Status: DC | PRN

## 2022-03-22 MED ORDER — SORBITOL 70 % SOLN: 30.0000 mL | Freq: Every day | Status: DC | PRN

## 2022-03-22 MED ORDER — METHOCARBAMOL 500 MG PO TABS: 500.0000 mg | ORAL_TABLET | Freq: Four times a day (QID) | ORAL | Status: DC | PRN

## 2022-03-22 MED ORDER — PROCHLORPERAZINE EDISYLATE 10 MG/2ML IJ SOLN: 5.0000 mg | Freq: Four times a day (QID) | INTRAMUSCULAR | Status: DC | PRN

## 2022-03-22 MED ORDER — PANTOPRAZOLE 2 MG/ML SUSPENSION
40.0000 mg | ORAL | Status: DC
  Administered 2022-03-22 – 2022-04-10 (×20): 40 mg
  Filled 2022-03-22 (×18): qty 20

## 2022-03-22 MED ORDER — ORAL CARE MOUTH RINSE
15.0000 mL | Freq: Four times a day (QID) | OROMUCOSAL | Status: DC
  Administered 2022-03-22 – 2022-04-15 (×81): 15 mL via OROMUCOSAL

## 2022-03-22 MED ORDER — SODIUM CHLORIDE 0.9 % IV SOLN: INTRAVENOUS | Status: DC

## 2022-03-22 MED ORDER — TRAZODONE HCL 50 MG PO TABS
25.0000 mg | ORAL_TABLET | Freq: Every evening | ORAL | Status: DC | PRN
  Administered 2022-03-25 – 2022-04-10 (×3): 50 mg
  Filled 2022-03-22 (×3): qty 1

## 2022-03-22 MED ORDER — FREE WATER
200.0000 mL | Freq: Four times a day (QID) | Status: DC
  Administered 2022-03-22 – 2022-03-23 (×4): 200 mL

## 2022-03-22 MED ORDER — ENOXAPARIN SODIUM 40 MG/0.4ML IJ SOSY: 40.0000 mg | PREFILLED_SYRINGE | INTRAMUSCULAR | Status: DC

## 2022-03-22 MED ORDER — LORAZEPAM 2 MG/ML IJ SOLN: 1.0000 mg | INTRAMUSCULAR | Status: DC | PRN

## 2022-03-22 MED ORDER — QUETIAPINE FUMARATE 25 MG PO TABS
25.0000 mg | ORAL_TABLET | Freq: Every day | ORAL | Status: DC
  Administered 2022-03-22 – 2022-04-12 (×22): 25 mg via ORAL
  Filled 2022-03-22 (×22): qty 1

## 2022-03-22 MED ORDER — ADULT MULTIVITAMIN W/MINERALS CH
1.0000 | ORAL_TABLET | Freq: Every day | ORAL | Status: DC
Start: 2022-03-23 — End: 2022-04-11
  Administered 2022-03-23 – 2022-04-11 (×20): 1
  Filled 2022-03-22 (×20): qty 1

## 2022-03-22 MED ORDER — DIPHENHYDRAMINE HCL 12.5 MG/5ML PO ELIX: 12.5000 mg | ORAL_SOLUTION | Freq: Four times a day (QID) | ORAL | Status: DC | PRN

## 2022-03-22 MED ORDER — FOLIC ACID 1 MG PO TABS
1.0000 mg | ORAL_TABLET | Freq: Every day | ORAL | Status: DC
  Administered 2022-03-23 – 2022-04-11 (×20): 1 mg
  Filled 2022-03-22 (×20): qty 1

## 2022-03-22 MED ORDER — POLYETHYLENE GLYCOL 3350 17 G PO PACK: 17.0000 g | PACK | Freq: Every day | ORAL | Status: DC | PRN

## 2022-03-22 MED ORDER — GLYCOPYRROLATE 1 MG PO TABS
1.0000 mg | ORAL_TABLET | Freq: Two times a day (BID) | ORAL | Status: DC
Start: 2022-03-22 — End: 2022-03-23
  Administered 2022-03-22 – 2022-03-23 (×2): 1 mg via ORAL
  Filled 2022-03-22 (×3): qty 1

## 2022-03-22 MED ORDER — PROSOURCE TF PO LIQD
90.0000 mL | Freq: Two times a day (BID) | ORAL | Status: DC
  Administered 2022-03-22 – 2022-03-23 (×2): 90 mL
  Filled 2022-03-22 (×2): qty 90

## 2022-03-22 MED ORDER — ENOXAPARIN SODIUM 40 MG/0.4ML IJ SOSY
40.0000 mg | PREFILLED_SYRINGE | INTRAMUSCULAR | Status: DC
  Administered 2022-03-23 – 2022-03-30 (×8): 40 mg via SUBCUTANEOUS
  Filled 2022-03-22 (×9): qty 0.4

## 2022-03-22 MED ORDER — SODIUM CHLORIDE 0.9 % IV SOLN
250.0000 mL | INTRAVENOUS | Status: DC
Start: 2022-03-22 — End: 2022-03-23

## 2022-03-22 MED ORDER — CHLORHEXIDINE GLUCONATE 0.12% ORAL RINSE (MEDLINE KIT)
15.0000 mL | Freq: Two times a day (BID) | OROMUCOSAL | Status: DC
  Administered 2022-03-22 – 2022-04-15 (×38): 15 mL via OROMUCOSAL

## 2022-03-22 MED ORDER — PROCHLORPERAZINE 25 MG RE SUPP: 12.5000 mg | Freq: Four times a day (QID) | RECTAL | Status: DC | PRN

## 2022-03-22 MED ORDER — PROCHLORPERAZINE MALEATE 5 MG PO TABS
5.0000 mg | ORAL_TABLET | Freq: Four times a day (QID) | ORAL | Status: DC | PRN
  Filled 2022-03-22: qty 1

## 2022-03-22 MED ORDER — LEVETIRACETAM 100 MG/ML PO SOLN
500.0000 mg | Freq: Two times a day (BID) | ORAL | Status: DC
  Administered 2022-03-22 – 2022-04-15 (×48): 500 mg
  Filled 2022-03-22 (×48): qty 5

## 2022-03-22 MED ORDER — OSMOLITE 1.5 CAL PO LIQD
1000.0000 mL | ORAL | Status: DC
  Administered 2022-03-23: 1000 mL
  Filled 2022-03-22: qty 1000

## 2022-03-22 MED ORDER — NICOTINE 14 MG/24HR TD PT24
14.0000 mg | MEDICATED_PATCH | Freq: Every day | TRANSDERMAL | Status: DC
  Administered 2022-03-23: 14 mg via TRANSDERMAL
  Filled 2022-03-22: qty 1

## 2022-03-22 MED ORDER — REVEFENACIN 175 MCG/3ML IN SOLN
175.0000 ug | Freq: Every day | RESPIRATORY_TRACT | Status: DC
  Administered 2022-03-23 – 2022-04-15 (×24): 175 ug via RESPIRATORY_TRACT
  Filled 2022-03-22 (×25): qty 3

## 2022-03-22 MED ORDER — CLOPIDOGREL BISULFATE 75 MG PO TABS
75.0000 mg | ORAL_TABLET | Freq: Every day | ORAL | Status: DC
  Administered 2022-03-23 – 2022-04-11 (×20): 75 mg
  Filled 2022-03-22 (×20): qty 1

## 2022-03-22 MED ORDER — ATORVASTATIN CALCIUM 40 MG PO TABS
40.0000 mg | ORAL_TABLET | Freq: Every day | ORAL | Status: DC
  Administered 2022-03-23 – 2022-04-11 (×20): 40 mg
  Filled 2022-03-22 (×20): qty 1

## 2022-03-22 MED ORDER — SODIUM CHLORIDE 0.9 % IV SOLN
INTRAVENOUS | Status: DC | PRN
Start: 2022-03-22 — End: 2022-03-23

## 2022-03-22 MED ORDER — DOCUSATE SODIUM 50 MG/5ML PO LIQD
100.0000 mg | Freq: Two times a day (BID) | ORAL | Status: DC
  Administered 2022-03-22 – 2022-04-11 (×40): 100 mg
  Filled 2022-03-22 (×41): qty 10

## 2022-03-22 MED ORDER — THIAMINE HCL 100 MG PO TABS
100.0000 mg | ORAL_TABLET | Freq: Every day | ORAL | Status: DC
  Administered 2022-03-23 – 2022-04-11 (×20): 100 mg
  Filled 2022-03-22 (×20): qty 1

## 2022-03-22 MED ORDER — FLEET ENEMA 7-19 GM/118ML RE ENEM: 1.0000 | ENEMA | Freq: Once | RECTAL | Status: DC | PRN

## 2022-03-22 MED ORDER — GUAIFENESIN 100 MG/5ML PO LIQD
15.0000 mL | Freq: Four times a day (QID) | ORAL | Status: DC
  Administered 2022-03-22 – 2022-04-11 (×76): 15 mL
  Filled 2022-03-22 (×72): qty 15

## 2022-03-22 MED ORDER — ACETAMINOPHEN 325 MG PO TABS
325.0000 mg | ORAL_TABLET | ORAL | Status: DC | PRN
  Administered 2022-03-31 – 2022-04-07 (×2): 650 mg
  Filled 2022-03-22 (×2): qty 2

## 2022-03-22 MED ORDER — ASPIRIN 81 MG PO CHEW
81.0000 mg | CHEWABLE_TABLET | Freq: Every day | ORAL | Status: DC
  Administered 2022-03-23 – 2022-04-11 (×20): 81 mg
  Filled 2022-03-22 (×20): qty 1

## 2022-03-22 MED ORDER — ARFORMOTEROL TARTRATE 15 MCG/2ML IN NEBU
15.0000 ug | INHALATION_SOLUTION | Freq: Two times a day (BID) | RESPIRATORY_TRACT | Status: DC
  Administered 2022-03-22 – 2022-04-15 (×46): 15 ug via RESPIRATORY_TRACT
  Filled 2022-03-22 (×46): qty 2

## 2022-03-22 MED ORDER — BUDESONIDE 0.5 MG/2ML IN SUSP
0.5000 mg | Freq: Two times a day (BID) | RESPIRATORY_TRACT | Status: DC
  Administered 2022-03-22 – 2022-04-15 (×48): 0.5 mg via RESPIRATORY_TRACT
  Filled 2022-03-22 (×46): qty 2

## 2022-03-22 NOTE — Progress Notes (Signed)
Modified Barium Swallow Progress Note ? ?Patient Details  ?Name: Todd Estrada ?MRN: [id number] ?Date of Birth: October 19, 1961 ? ?Today's Date: 03/22/2022 ? ?Modified Barium Swallow completed.  Full report located under Chart Review in the Imaging Section. ? ?Brief recommendations include the following: ? ?Clinical Impression ? Pt was seen for MBS. Teal PMSV in place throughout the duration of today's study. Pt had significant lethargy which may have impacted results yielded from [place]. MBS revealed that the pt has a moderate oropharyngeal dysphagia. Risk of aspiration is mildly increased d/t signficant lethargy. Oral phase of swallowing impaired as evidenced by delayed oral transit and propulsion of boluses, lingual/palatal residue, premature spillage and oral holding. Pt required moderate verbal and tactile cues in order to resolve oral holding behaviors across conssitencies and textures. Superior & anterior movement of the hyoid appeared WNL with adequate epiglottic deflection, however this was inconsistent d/t lethargy. Pt had instances of weakness where hyoid excursion was significantly reduced leading to increased penetration and aspiration events. Pt penetrated and aspirated with thin liquids. Pt was able to sense penetrates/aspirates and eject them with a cough. Aspiration and penetration of thin liquids is the result of residuals pooling in the valleculae, pyriform sinuses and posterior pharynx. Residuals spill over into laryngeal vestibule after the pharyngeal swallow. Residuals are a result of generalized weakness. Weakness observed may be the result of patient's increased level of lethargy. Pt tolerated cup sips of NTL with instances of flash penetration (PAS 2). Tolerated applesauce with no signs of penetration or aspriation observed. Per patient's decreased arousal/alertness to PO trials, SLP recommends continuing NPO diet. As patient's level of arousal and attention to POs improves, recommend initiating more  conservative diet of Dysphagia 1 with nectar thick liquids under full supervision of the SLP. Pt demonstrates good prognosis for diet advancement with consistent levels of alertness and arousal. ?  ?Swallow Evaluation Recommendations ? ?   ? ? SLP Diet Recommendations: NPO ? ? Liquid Administration via: Straw ? ? Medication Administration: Crushed with puree ? ? Supervision: Full supervision/cueing for compensatory strategies;Full assist for feeding ? ? Compensations: Minimize environmental distractions;Slow rate;Small sips/bites ? ? Postural Changes: Seated upright at 90 degrees ? ? Oral Care Recommendations: Oral care BID ? ? Other Recommendations: Have oral suction available ? ? ? ?Vaughan Sine ?03/22/2022,11:08 AM ?

## 2022-03-22 NOTE — Progress Notes (Signed)
Report called to [facility] on [facility]Rehab.  All questions answered.  Will continue to monitor until pt transfers.     ?

## 2022-03-22 NOTE — Progress Notes (Signed)
Stable for d/c to CIR  ? ?No changes--needs BMET in 1-2 d ? ?Verneita Griffes, MD ?[facility] ?8:39 AM ? ?

## 2022-03-22 NOTE — H&P (Signed)
? ? ?Physical Medicine and Rehabilitation Admission H&P ? ?  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 ?HPI: Case Todd Estrada is a 61 year old male whose roommate witnessed seizure-like activity at home on 03/02/2022. EMS called and transported to [facility] ED. Alert but appeared confused and history significant for previous left MCA stroke on aspirin therapy. Slurred speech and right-sided weakness noted. Code stroke initiated and neurology consulted. Dr. Sarita Haver evaluated and patient not tPa candidate due to unknown LKW. CTA concerning for occlusion of left ICA and IR consulted. He underwent embolectomy and balloon angioplasty by Dr. Estanislado Pandy on 3/15. Given Brilinta and aspirin and extubated. Agitated post-procedure given Ativan prn. History given for excessive alcohol use and monitored for withdrawal. EEG performed 3/16. Noted to be obtunded and aspirating and CCM consulted with re-intubation and mechanical ventilation with evidence of acute hypoxic respiratory failure with aspiration pneumonia. Flexible bronchoscopy on 3/16 as well. OGT placed and TFs began. MRI carried out on 3/17 with findings worrisome for acute left MCA territory infarcts predominately involving the left frontal, parietal and temporal lobes with associated edema and sulcal effacement without midline shift, chronically occluded left vertebral artery occlusion. Extubated again on 3/18 but required re-intubation two hours later. He self extubated the following day on 3/19. NRB mask placed. Required Precedex for agitation. Traumatic nosebleed secondary to NGT placement; Rhino Rocket placed. Precedex stopped, Rocket removed and Cortrak planned. Re-intubated 3/21 secondary to aspiration and lack of gag response.  Percutaneous tracheostomy on 3/22. Could not get Cortrak placed and bedside PEG placement performed on 3/24. Plavix started and aspirin continued. Off  mechanical ventilation on 3/26. Tolerating trach collar for > 48 hours. Trach changed to cuffless #6 on 3/30. PMV with supervision. ? ? ?2D echo: EF 4-45% ?LDL = 60 ?A1c = 5.4 ?Review of Systems  ?Reason unable to perform ROS: limited due to trach.  And sedation ?Past Medical History:  ?Diagnosis Date  ? Hypertension   ? ?Past Surgical History:  ?Procedure Laterality Date  ? ESOPHAGOGASTRODUODENOSCOPY (EGD) WITH PROPOFOL N/A 03/11/2022  ? Procedure: ESOPHAGOGASTRODUODENOSCOPY (EGD) WITH PROPOFOL;  Surgeon: Georganna Skeans, MD;  Location: [place];  Service: General;  Laterality: N/A;  ? IR ANGIO INTRA EXTRACRAN SEL COM CAROTID INNOMINATE UNI R MOD SED  03/02/2022  ? IR CT HEAD LTD  03/02/2022  ? IR PERCUTANEOUS ART THROMBECTOMY/INFUSION INTRACRANIAL INC DIAG ANGIO  03/02/2022  ? PEG PLACEMENT N/A 03/11/2022  ? Procedure: PERCUTANEOUS ENDOSCOPIC GASTROSTOMY (PEG) PLACEMENT;  Surgeon: Georganna Skeans, MD;  Location: [place];  Service: General;  Laterality: N/A;  ? RADIOLOGY WITH ANESTHESIA N/A 03/02/2022  ? Procedure: IR WITH ANESTHESIA;  Surgeon: Luanne Bras, MD;  Location: [place];  Service: Radiology;  Laterality: N/A;  ? ?History reviewed. No pertinent family history. ?Social History:  reports that he has been smoking. He has never used smokeless tobacco. He reports current alcohol use. He reports that he does not use drugs. ?Allergies: No Known Allergies ?Medications Prior to Admission  ?Medication Sig Dispense Refill  ? aspirin EC 81 MG EC tablet Take 1 tablet (81 mg total) by mouth daily. Swallow whole. (Patient not taking: Reported on 03/07/2022) 30 tablet 11  ? atorvastatin (LIPITOR) 80 MG tablet Take 1 tablet (80 mg total) by mouth daily. (Patient not taking: Reported on 03/07/2022) 30 tablet 1  ? metoprolol tartrate (LOPRESSOR) 25 MG tablet Take 0.5 tablets (12.5 mg total) by mouth 2 (two) times daily. (  Patient not taking: Reported on 03/07/2022) 30 tablet 1  ? ? ? ? ?Home: ?Home Living ?Family/patient  expects to be discharged to:: Private residence ?Living Arrangements: Other relatives (cousin and uncle) ?Available Help at Discharge: Family ?Type of Home: House ?Home Access: Stairs to enter ?Entrance Stairs-Number of Steps: 2 ?Home Layout: One level ?Bathroom Shower/Tub: Tub/shower unit ?Bathroom Toilet: Standard ?Bathroom Accessibility: No ?Home Equipment: Conservation officer, nature (2 wheels), [employer] - single point, BSC/3in1, Wheelchair - manual ?Additional Comments: home information from prior admission, pt unable to state ?  ?Functional History: ?Prior Function ?Prior Level of Function : Independent/Modified Independent ?Mobility Comments: unable to state, noted had residual R side weaknes from prior stroke and aphasia; today grunts to questions through venturi mask ? ?Functional Status:  ?Mobility: ?Bed Mobility ?Overal bed mobility: Needs Assistance ?Bed Mobility: Supine to Sit, Sit to Supine ?Rolling: Mod assist ?Supine to sit: Min assist ?Sit to supine: Min assist ?General bed mobility comments: Cues for safety and assist for lines ?Transfers ?Overall transfer level: Needs assistance ?Equipment used: 2 person hand held assist, Rolling walker (2 wheels) ?Transfers: Sit to/from Stand, Bed to chair/wheelchair/BSC ?Sit to Stand: Mod assist ?Bed to/from chair/wheelchair/BSC transfer type:: Step pivot, Stand pivot ?Stand pivot transfers: Mod assist, +2 physical assistance ?Squat pivot transfers: Mod assist, +2 safety/equipment ?Step pivot transfers: Mod assist, +2 physical assistance ?General transfer comment: Performed sit to stand x 6 during session - attempted with RW but R hand unable to maintain grip so switched to HHA.  Step pivot to [facility] but R leg buckling and pt requiring max cues and mod A to maintain balance.  Stand pivot back to bed mod A of 2 for safety ?Ambulation/Gait ?Ambulation/Gait assistance: Mod assist, +2 physical assistance ?Gait Distance (Feet): 3 Feet ?Assistive device: 2 person hand held assist ?Gait  Pattern/deviations: Step-to pattern, Decreased stride length ?General Gait Details: Side steps at EOB but pt fatigues easily, legs buckling, requiring mod A x 2 and max cues ?Gait velocity interpretation: <1.31 ft/sec, indicative of household ambulator ?  ? ?ADL: ?ADL ?Overall ADL's : Needs assistance/impaired ?Functional mobility during ADLs:  (unable to maintain level of arousal to attmept mobility this date) ?General ADL Comments: Max A with UB bathing and washing face ? ?Cognition: ?Cognition ?Overall Cognitive Status: No family/caregiver present to determine baseline cognitive functioning ?Orientation Level: Intubated/Tracheostomy - Unable to assess ?Cognition ?Arousal/Alertness: Lethargic ?Behavior During Therapy: Flat affect ?Overall Cognitive Status: No family/caregiver present to determine baseline cognitive functioning ?Area of Impairment: Following commands, Awareness, Memory, Attention, Safety/judgement, Problem solving ?Current Attention Level: Focused ?Memory: Decreased short-term memory, Decreased recall of precautions ?Following Commands: Follows one step commands inconsistently ?Safety/Judgement: Decreased awareness of deficits, Decreased awareness of safety ?Awareness: Intellectual ?Problem Solving: Slow processing ?General Comments: Pt following 50% of basic commands; flat affect and quiet today - tech reports pt was much more talkative and alert last session ?Difficult to assess due to: Tracheostomy, Level of arousal ? ?Physical Exam: ?Blood pressure 107/77, pulse 75, temperature 98.1 ?F (36.7 ?C), temperature source Oral, resp. rate 20, height 6' (1.829 m), weight 81.3 kg, SpO2 98 %. ?Physical Exam ?Vitals and nursing note reviewed. Exam conducted with a chaperone present.  ?Constitutional:   ?   General: He is not in acute distress. ?   Comments: Pt intermittently opened eyes, however kept them closed, not interactive for most of interview; nurse at bedside; in mittens b/l and posey waist  restraint, NAD ?Large beard- has dried white substance in beard  ?HENT:  ?  Head: Normocephalic and atraumatic.  ?   Comments: R facial droop- tongue covered with barium from previous MBS; ?Tongue appears midline

## 2022-03-22 NOTE — Progress Notes (Signed)
Inpatient Rehabilitation Admission Medication Review by a Pharmacist ? ?A complete drug regimen review was completed for this patient to identify any potential clinically significant medication issues. ? ?High Risk Drug Classes Is patient taking? Indication by Medication  ?Antipsychotic Yes Compazine, seroquel- N/V, sleep  ?Anticoagulant Yes Lovenox- VTE prophylaxis  ?Antibiotic No   ?Opioid No   ?Antiplatelet Yes Aspirin, plavix- CVA prophylaxis  ?Hypoglycemics/insulin No   ?Vasoactive Medication No   ?Chemotherapy No   ?Other Yes Lipitor- HLD ?Protonix- GERD  ? ? ? ?Type of Medication Issue Identified Description of Issue Recommendation(s)  ?Drug Interaction(s) (clinically significant) ?    ?Duplicate Therapy ?    ?Allergy ?    ?No Medication Administration End Date ?    ?Incorrect Dose ?    ?Additional Drug Therapy Needed ?    ?Significant med changes from prior encounter (inform family/care partners about these prior to discharge).    ?Other ?    ? ? ?Clinically significant medication issues were identified that warrant physician communication and completion of prescribed/recommended actions by midnight of the next day:  No ? ?Time spent performing this drug regimen review (minutes):  30 ? ? ?Guy Seese BS, PharmD, BCPS ?Clinical Pharmacist ?03/22/2022 2:48 PM ?

## 2022-03-22 NOTE — Progress Notes (Signed)
Physical Therapy Treatment ?Patient Details ?Name: Todd Estrada ?MRN: [id number] ?DOB: 1961/03/31 ?Today's Date: 03/22/2022 ? ? ?History of Present Illness 61 y/o male with a history of Left MCA stroke in 1660 with a complicated post stroke course presented on 3/15 with a 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  He was extubated following procedure, but reintubated due to respiratory failure.  Self extubated overnight 3/19,  Re-intubated on 3/21 and bronchoscopy and tracheostomy performed on 3/22; PEG on 3/24. ? ?  ?PT Comments  ? ? Pt with lethargy today requiring maxA for bed mobility. Once sitting EOB pt more alert but continued with poor command follow to engage in exercises. Pt did stand however resisted side stepping despite tactile cues from therapist. Acute PT to cont to follow. ?   ?Recommendations for follow up therapy are one component of a multi-disciplinary discharge planning process, led by the attending physician.  Recommendations may be updated based on patient status, additional functional criteria and insurance authorization. ? ?Follow Up Recommendations ? Acute inpatient rehab (3hours/day) ?  ?  ?Assistance Recommended at Discharge Frequent or constant Supervision/Assistance  ?Patient can return home with the following Two people to help with walking and/or transfers;Two people to help with bathing/dressing/bathroom;Assistance with feeding;Assistance with cooking/housework;Direct supervision/assist for medications management;Assist for transportation;Help with stairs or ramp for entrance;Direct supervision/assist for financial management ?  ?Equipment Recommendations ? Wheelchair (measurements PT);Wheelchair cushion (measurements PT);Hospital bed;BSC/3in1  ?  ?Recommendations for Other Services Rehab consult ? ? ?  ?Precautions / Restrictions Precautions ?Precautions: Fall ?Precaution Comments: posey belt, wrist restraint on L, Bil mitts, R  inattention, ?Restrictions ?Weight Bearing Restrictions: No  ?  ? ?Mobility ? Bed Mobility ?Overal bed mobility: Needs Assistance ?Bed Mobility: Supine to Sit, Sit to Supine ?Rolling: Max assist ?  ?Supine to sit: Max assist ?Sit to supine: Min assist ?  ?General bed mobility comments: pt with no initiation, tactile cues to use R UE to reach for bed rail however pt unable. maxA for LE management and then trunk elevation to EOB, once pt EOB, pt close min guard for balance ?  ? ?Transfers ?Overall transfer level: Needs assistance ?Equipment used: 2 person hand held assist ?Transfers: Sit to/from Stand, Bed to chair/wheelchair/BSC ?Sit to Stand: Mod assist, +2 physical assistance ?  ?  ?  ?  ?  ?General transfer comment: pt required 2 attemps prior to completion of stand. required verbal and tactile cues, R knee blocked, tactile cues for weight shift to attempt side stepping to [facility] ?  ? ?Ambulation/Gait ?  ?  ?  ?  ?  ?  ?  ?  ? ? ?Stairs ?  ?  ?  ?  ?  ? ? ?Wheelchair Mobility ?  ? ?Modified Rankin (Stroke Patients Only) ?Modified Rankin (Stroke Patients Only) ?Pre-Morbid Rankin Score: Slight disability ?Modified Rankin: Severe disability ? ? ?  ?Balance Overall balance assessment: Needs assistance ?Sitting-balance support: Feet supported, No upper extremity supported ?Sitting balance-Leahy Scale: Fair ?Sitting balance - Comments: EOB with close supervision ?  ?Standing balance support: Single extremity supported ?Standing balance-Leahy Scale: Poor ?Standing balance comment: Requiring UE support on L and min static, mod A of 2 dynamic balance.  Stood multiple times for ADLs but fatigued easily -only able to stand 10-20 sec at a time ?  ?  ?  ?  ?  ?  ?  ?  ?  ?  ?  ?  ? ?  ?Cognition  Arousal/Alertness: Lethargic ?Behavior During Therapy: Flat affect ?Overall Cognitive Status: No family/caregiver present to determine baseline cognitive functioning ?Area of Impairment: Following commands, Awareness, Memory, Attention,  Safety/judgement, Problem solving ?  ?  ?  ?  ?  ?  ?  ?  ?  ?Current Attention Level: Focused ?Memory: Decreased short-term memory, Decreased recall of precautions ?Following Commands: Follows one step commands inconsistently ?Safety/Judgement: Decreased awareness of deficits, Decreased awareness of safety (R sided inattention) ?Awareness: Intellectual ?Problem Solving: Slow processing ?General Comments: Pt following 50% of basic commands; flat affect and quiet today ?  ?  ? ?  ?Exercises Other Exercises ?Other Exercises: attempted to engage pt in LE exercises however pt with <50% command follow despite tactile cues ? ?  ?General Comments General comments (skin integrity, edema, etc.): VSS on trach collar ?  ?  ? ?Pertinent Vitals/Pain Pain Assessment ?Pain Assessment: Faces ?Faces Pain Scale: No hurt  ? ? ?Home Living   ?  ?  ?  ?  ?  ?  ?  ?  ?  ?   ?  ?Prior Function    ?  ?  ?   ? ?PT Goals (current goals can now be found in the care plan section) Acute Rehab PT Goals ?Patient Stated Goal: unable to state ?PT Goal Formulation: Patient unable to participate in goal setting ?Time For Goal Achievement: 03/24/22 ?Potential to Achieve Goals: Fair ?Progress towards PT goals: Progressing toward goals ? ?  ?Frequency ? ? ? Min 4X/week ? ? ? ?  ?PT Plan Current plan remains appropriate  ? ? ?Co-evaluation   ?  ?  ?  ?  ? ?  ?AM-PAC PT "6 Clicks" Mobility   ?Outcome Measure ? Help needed turning from your back to your side while in a flat bed without using bedrails?: A Lot ?Help needed moving from lying on your back to sitting on the side of a flat bed without using bedrails?: A Lot ?Help needed moving to and from a bed to a chair (including a wheelchair)?: Total ?Help needed standing up from a chair using your arms (e.g., wheelchair or bedside chair)?: Total ?Help needed to walk in hospital room?: Total ?Help needed climbing 3-5 steps with a railing? : Total ?6 Click Score: 8 ? ?  ?End of Session Equipment Utilized  During Treatment: Oxygen;Gait belt ?Activity Tolerance: Patient tolerated treatment well ?Patient left: in bed;with restraints reapplied;with call bell/phone within reach;with bed alarm set (mittens, L wrist, and waist restriant) ?Nurse Communication: Mobility status ?PT Visit Diagnosis: Other abnormalities of gait and mobility (R26.89);Other symptoms and signs involving the nervous system (R29.898);Hemiplegia and hemiparesis ?Hemiplegia - Right/Left: Right ?Hemiplegia - dominant/non-dominant: Non-dominant ?Hemiplegia - caused by: Cerebral infarction ?  ? ? ?Time: 6440-3474 ?PT Time Calculation (min) (ACUTE ONLY): 17 min ? ?Charges:  $Neuromuscular Re-education: 8-22 mins          ?          ? ?Kittie Plater, PT, DPT ?Acute Rehabilitation Services ?Secure chat preferred ?Office #: [phone] ? ? ? ?Gwenyth Dingee M Martell Mcfadyen ?03/22/2022, 12:44 PM ? ?

## 2022-03-22 NOTE — Progress Notes (Signed)
Patient admitted to 4W-11 at 1500. Patient not able to respond verbally. Can respond to simple questions by nodding head. On arrival patient with continuous Osmolite 1.5 infusing via peg tube at 50cc/hr. Site cleansed and dressing placed. NS infusing at [facility] 10cc/hr via L FA IV. Shiley trach cuffless #6 in place with PSMV in place and 2 L via tracheostomy collar. Continuous pulse o2 in place - 90% currently. DTI noted to R heel - please see flowsheet for measurements. Heels floated. Continence care provided. Bed alarm in place. Call bell within reach.  ?

## 2022-03-22 NOTE — Plan of Care (Signed)
?  Problem: Safety: ?Goal: Non-violent Restraint(s) ?Outcome: Progressing ?  ?Problem: Education: ?Goal: Knowledge of disease or condition will improve ?Outcome: Progressing ?Goal: Knowledge of secondary prevention will improve (SELECT ALL) ?Outcome: Progressing ?Goal: Knowledge of patient specific risk factors will improve (INDIVIDUALIZE FOR PATIENT) ?Outcome: Progressing ?Goal: Individualized Educational Video(s) ?Outcome: Progressing ?  ?Problem: Coping: ?Goal: Will verbalize positive feelings about self ?Outcome: Progressing ?Goal: Will identify appropriate support needs ?Outcome: Progressing ?  ?Problem: Health Behavior/Discharge Planning: ?Goal: Ability to manage health-related needs will improve ?Outcome: Progressing ?  ?Problem: Self-Care: ?Goal: Ability to participate in self-care as condition permits will improve ?Outcome: Progressing ?Goal: Verbalization of feelings and concerns over difficulty with self-care will improve ?Outcome: Progressing ?Goal: Ability to communicate needs accurately will improve ?Outcome: Progressing ?  ?Problem: Nutrition: ?Goal: Risk of aspiration will decrease ?Outcome: Progressing ?Goal: Dietary intake will improve ?Outcome: Progressing ?  ?Problem: Ischemic Stroke/TIA Tissue Perfusion: ?Goal: Complications of ischemic stroke/TIA will be minimized ?Outcome: Progressing ?  ?

## 2022-03-22 NOTE — Progress Notes (Signed)
Inpatient Rehab Admissions Coordinator:   ? ?I have insurance approval and a bed available for pt to admit to CIR today. Dr. Verlon Au in agreement.  Will let pt/family and TOC team know.  ? ?Shann Medal, PT, DPT ?Admissions Coordinator ?[phone] ?03/22/22  ?9:47 AM  ? ?

## 2022-03-22 NOTE — TOC Transition Note (Signed)
Transition of Care (TOC) - CM/SW Discharge Note ? ? ?Patient Details  ?Name: Todd Estrada ?MRN: [id number] ?Date of Birth: Jun 12, 1961 ? ?Transition of Care (TOC) CM/SW Contact:  ?Pollie Friar, RN ?Phone Number: ?03/22/2022, 10:04 AM ? ? ?Clinical Narrative:    ?Patient discharging to CIR today. CM signing off.  ? ? ?Final next level of care: [place] ?Barriers to Discharge: No Barriers Identified ? ? ?Patient Goals and CMS Choice ?  ?  ?Choice offered to / list presented to : Patient ? ?Discharge Placement ?  ?           ?  ?  ?  ?  ? ?Discharge Plan and Services ?  ?  ?           ?  ?  ?  ?  ?  ?  ?  ?  ?  ?  ? ?Social Determinants of Health (SDOH) Interventions ?  ? ? ?Readmission Risk Interventions ?   ? View : No data to display.  ?  ?  ?  ? ? ? ? ? ?

## 2022-03-22 NOTE — PMR Pre-admission (Signed)
PMR Admission Coordinator Pre-Admission Assessment ? ?Patient: Todd Estrada is an 61 y.o., male ?MRN: [id number] ?DOB: [birth year] ?Height: 6' (182.9 cm) ?Weight: 81.3 kg ? ?Insurance Information ?HMO:     PPO:      PCP:      IPA:      80/20:      OTHER:  ?PRIMARY: [employer] (medicaid)      Policy#: [id number]      Subscriber: pt ?CM Name: Nevin Bloodgood      Phone#: [phone]     Fax#: [phone] ?Pre-Cert#: [id number] (also provided with number [id number]) auth for CIR from [place] with updates due to fax listed above on 4/10       Employer:  ?Benefits:  Phone #: [phone]     Name:  ?Eff. Date: 06/18/2020     Deduct: $0      Out of Pocket Max: $0      Life Max:  ?CIR: 100%      SNF:  ?Outpatient:      Co-Pay:  ?Home Health:       Co-Pay:  ?DME:      Co-Pay:  ?Providers:  ?SECONDARY:       Policy#:      Phone#:  ? ?Financial Counselor:       Phone#:  ? ?The ?Data Collection Information Summary? for patients in Inpatient Rehabilitation Facilities with attached ?Privacy Act [place] Records? was provided and verbally reviewed with: N/A ? ?Emergency Contact Information ?Contact Information   ? ? Name Relation Home Work Mobile  ? Solomon,James Other   [phone]  ? Tyrick, [place] [phone]    ? ?  ? ? ?Current Medical History  ?Patient Admitting Diagnosis: L MCA CVA ? ?History of Present Illness: Pt is a 61 y/o male with PMH of L MCA with petechial hemorrhage, chronic combined CHF, HTN, and ETOH use, admitted on 3/15 to [facility] with seizure like activity with aphasia.  CTA head/neck showed occlusion of ICA.  Pt underwent thrombectomy and placed on Keppra.  Post procedure required reintubation on 3/16 following aspiration event.  Echo showed LVEF 40-45%.  MRI brain showed acute infarct in L frontal, temporal, and parietal lobes.  CT showed no hemorrhage.  Extubated and reintubated multiple times (E 3/18, I 3/18, E 3/19, I 3/21).  He underwent trach placement on 3/22 and peg placement on 3/24.   Weaned to trach collar on 3/26 and currently tolerating 21% trach collar.  He was downsized to a size 6 shiley cuffless on 3/30.  Therapy ongoing and pt was recommended for CIR.  ? ?Complete NIHSS TOTAL: 11 ? ?Patient's medical record from [name] has been reviewed by the rehabilitation admission coordinator and physician. ? ?Past Medical History  ?Past Medical History:  ?Diagnosis Date  ? Hypertension   ? ? ?Has the patient had major surgery during 100 days prior to admission? Yes ? ?Family History   ?family history is not on file. ? ?Current Medications ? ?Current Facility-Administered Medications:  ?  0.9 %  sodium chloride infusion, , Intravenous, PRN, Jacky Kindle, MD, Last Rate: 10 mL/hr at 03/14/22 0600, Infusion Verify at 03/14/22 0600 ?  0.9 %  sodium chloride infusion, 250 mL, Intravenous, Continuous, Jacky Kindle, MD, Stopped at 03/15/22 0904 ?  0.9 %  sodium chloride infusion, , Intravenous, Continuous, Samtani, Jai-Gurmukh, MD, Last Rate: 10 mL/hr at 03/22/22 0906, Rate Change at 03/22/22 0906 ?  acetaminophen (TYLENOL) tablet 650 mg, 650 mg, Oral, Q4H PRN, 650 mg  at 03/21/22 2008 **OR** acetaminophen (TYLENOL) 160 MG/5ML solution 650 mg, 650 mg, Per Tube, Q4H PRN, 650 mg at 03/22/22 0516 **OR** acetaminophen (TYLENOL) suppository 650 mg, 650 mg, Rectal, Q4H PRN, Luanne Bras, MD, 650 mg at 03/08/22 0035 ?  arformoterol (BROVANA) nebulizer solution 15 mcg, 15 mcg, Nebulization, BID, Simonne Maffucci B, MD, 15 mcg at 03/22/22 7014 ?  aspirin chewable tablet 81 mg, 81 mg, Per Tube, Daily, de Yolanda Manges, [place] E, NP, 81 mg at 03/22/22 0831 ?  atorvastatin (LIPITOR) tablet 40 mg, 40 mg, Per Tube, Daily, de Yolanda Manges, Cortney E, NP, 40 mg at 03/22/22 0831 ?  bisacodyl (DULCOLAX) suppository 10 mg, 10 mg, Rectal, Daily PRN, Aslam, Sadia, MD, 10 mg at 03/15/22 0343 ?  budesonide (PULMICORT) nebulizer solution 0.5 mg, 0.5 mg, Nebulization, BID, McQuaid, Douglas B, MD, 0.5 mg at 03/22/22 0844 ?   chlorhexidine gluconate (MEDLINE KIT) (PERIDEX) 0.12 % solution 15 mL, 15 mL, Mouth Rinse, BID, Chand, Sudham, MD, 15 mL at 03/22/22 1030 ?  Chlorhexidine Gluconate Cloth 2 % PADS 6 each, 6 each, Topical, Q0600, Luanne Bras, MD, 6 each at 03/21/22 0548 ?  clopidogrel (PLAVIX) tablet 75 mg, 75 mg, Per Tube, Daily, de Yolanda Manges, Cortney E, NP, 75 mg at 03/22/22 0831 ?  docusate (COLACE) 50 MG/5ML liquid 100 mg, 100 mg, Per Tube, BID, Agarwala, Ravi, MD, 100 mg at 03/21/22 2008 ?  enoxaparin (LOVENOX) injection 40 mg, 40 mg, Subcutaneous, Q24H, Donnetta Simpers, MD, 40 mg at 03/22/22 0831 ?  feeding supplement (OSMOLITE 1.5 CAL) liquid 1,000 mL, 1,000 mL, Per Tube, Continuous, Chand, Sudham, MD, Last Rate: 55 mL/hr at 03/21/22 1445, 1,000 mL at 03/21/22 1445 ?  feeding supplement (PROSource TF) liquid 90 mL, 90 mL, Per Tube, BID, Jacky Kindle, MD, 90 mL at 03/22/22 0830 ?  folic acid (FOLVITE) tablet 1 mg, 1 mg, Per Tube, Daily, Franky Macho, RPH, 1 mg at 03/22/22 0831 ?  free water 200 mL, 200 mL, Per Tube, Q6H, Samtani, Jai-Gurmukh, MD, 200 mL at 03/22/22 0600 ?  glycopyrrolate (ROBINUL) tablet 1 mg, 1 mg, Oral, BID, Minor, Grace Bushy, NP, 1 mg at 03/22/22 0831 ?  guaiFENesin (ROBITUSSIN) 100 MG/5ML liquid 15 mL, 15 mL, Per Tube, Q6H, Agarwala, Ravi, MD, 15 mL at 03/22/22 0516 ?  hydrALAZINE (APRESOLINE) injection 10-40 mg, 10-40 mg, Intravenous, Q4H PRN, Rosalin Hawking, MD, 20 mg at 03/08/22 1707 ?  levETIRAcetam (KEPPRA) 100 MG/ML solution 500 mg, 500 mg, Per Tube, BID, Jesusita Oka, MD, 500 mg at 03/22/22 0831 ?  LORazepam (ATIVAN) injection 1 mg, 1 mg, Intravenous, Q4H PRN, Jacky Kindle, MD, 1 mg at 03/12/22 0243 ?  MEDLINE mouth rinse, 15 mL, Mouth Rinse, QID, Garvin Fila, MD, 15 mL at 03/22/22 1314 ?  multivitamin with minerals tablet 1 tablet, 1 tablet, Per Tube, Daily, Jacky Kindle, MD, 1 tablet at 03/22/22 0831 ?  nicotine (NICODERM CQ - dosed in mg/24 hours) patch 14 mg, 14 mg, Transdermal,  Daily, Minor, Grace Bushy, NP, 14 mg at 03/22/22 3888 ?  pantoprazole sodium (PROTONIX) 40 mg/20 mL oral suspension 40 mg, 40 mg, Per Tube, Q24H, Franky Macho, RPH, 40 mg at 03/21/22 2009 ?  polyethylene glycol (MIRALAX / GLYCOLAX) packet 17 g, 17 g, Per Tube, Daily PRN, Nita Sells, MD ?  QUEtiapine (SEROQUEL) tablet 25 mg, 25 mg, Oral, QHS, Samtani, Jai-Gurmukh, MD, 25 mg at 03/21/22 2009 ?  revefenacin (YUPELRI) nebulizer solution 175 mcg, 175 mcg,  Nebulization, Daily, Juanito Doom, MD, 175 mcg at 03/22/22 4128 ?  thiamine tablet 100 mg, 100 mg, Per Tube, Daily, Franky Macho, RPH, 100 mg at 03/22/22 0831 ? ?Patients Current Diet:  ?Diet Order   ? ?       ?  Diet - low sodium heart healthy       ?  ?  Diet NPO time specified Except for: Other (See Comments)  Diet effective now       ?  ? ?  ?  ? ?  ? ? ?Precautions / Restrictions ?Precautions ?Precautions: Fall ?Precaution Comments: posey belt, wrist restraint on L, Bil mitts, R inattention, ?Restrictions ?Weight Bearing Restrictions: No  ? ?Has the patient had 2 or more falls or a fall with injury in the past year? No ? ?Prior Activity Level ?Limited Community (1-2x/wk): per cousin, out for appointments, not driving, assist for ADLs (tub transfer specifically), mod I without DME ? ?Prior Functional Level ?Self Care: Did the patient need help bathing, dressing, using the toilet or eating? Needed some help ? ?Indoor Mobility: Did the patient need assistance with walking from room to room (with or without device)? Independent ? ?Stairs: Did the patient need assistance with internal or external stairs (with or without device)? Needed some help ? ?Functional Cognition: Did the patient need help planning regular tasks such as shopping or remembering to take medications? Needed some help ? ?Patient Information ?Are you of Hispanic, Latino/a,or Spanish origin?: A. No, not of Hispanic, Latino/a, or Spanish origin, X. Patient unable to respond  (proxy) ?What is your race?: A. White, X. Patient unable to respond (proxy) ?Do you need or want an interpreter to communicate with a doctor or health care staff?: 0. No (proxy) ? ?Patient's Response To:  ?[facility]

## 2022-03-22 NOTE — Progress Notes (Signed)
PMR Admission Coordinator Pre-Admission Assessment ?  ?Patient: Todd Estrada is an 61 y.o., male ?MRN: [id number] ?DOB: 06-10-1961 ?Height: 6' (182.9 cm) ?Weight: 81.3 kg ?  ?Insurance Information ?HMO:     PPO:      PCP:      IPA:      80/20:      OTHER:  ?PRIMARY: [employer] (medicaid)      Policy#: [id number]      Subscriber: pt ?CM Name: Nevin Bloodgood      Phone#: [phone]     Fax#: [phone] ?Pre-Cert#: [id number] (also provided with number [id number]) auth for CIR from [place] with updates due to fax listed above on 4/10       Employer:  ?Benefits:  Phone #: [phone]     Name:  ?Eff. Date: 06/18/2020     Deduct: $0      Out of Pocket Max: $0      Life Max:  ?CIR: 100%      SNF:  ?Outpatient:      Co-Pay:  ?Home Health:       Co-Pay:  ?DME:      Co-Pay:  ?Providers:  ?SECONDARY:       Policy#:      Phone#:  ?  ?Financial Counselor:       Phone#:  ?  ?The ?Data Collection Information Summary? for patients in Inpatient Rehabilitation Facilities with attached ?Privacy Act [place] Records? was provided and verbally reviewed with: N/A ?  ?Emergency Contact Information ?Contact Information   ?  ?  Name Relation Home Work Mobile  ?  Bielefeld,James Other     [phone]  ?  Haydan, [place]ouri [phone]      ?  ?   ?  ?  ?Current Medical History  ?Patient Admitting Diagnosis: L MCA CVA ?  ?History of Present Illness: Pt is a 61 y/o male with PMH of L MCA with petechial hemorrhage, chronic combined CHF, HTN, and ETOH use, admitted on 3/15 to [facility] with seizure like activity with aphasia.  CTA head/neck showed occlusion of ICA.  Pt underwent thrombectomy and placed on Keppra.  Post procedure required reintubation on 3/16 following aspiration event.  Echo showed LVEF 40-45%.  MRI brain showed acute infarct in L frontal, temporal, and parietal lobes.  CT showed no hemorrhage.  Extubated and reintubated multiple times (E 3/18, I 3/18, E 3/19, I 3/21).  He underwent trach placement on 3/22 and peg  placement on 3/24.  Weaned to trach collar on 3/26 and currently tolerating 21% trach collar.  He was downsized to a size 6 shiley cuffless on 3/30.  Therapy ongoing and pt was recommended for CIR.  ?  ?Complete NIHSS TOTAL: 11 ?  ?Patient's medical record from [name] has been reviewed by the rehabilitation admission coordinator and physician. ?  ?Past Medical History  ?    ?Past Medical History:  ?Diagnosis Date  ? Hypertension    ?  ?  ?Has the patient had major surgery during 100 days prior to admission? Yes ?  ?Family History   ?family history is not on file. ?  ?Current Medications ?  ?Current Facility-Administered Medications:  ?  0.9 %  sodium chloride infusion, , Intravenous, PRN, Jacky Kindle, MD, Last Rate: 10 mL/hr at 03/14/22 0600, Infusion Verify at 03/14/22 0600 ?  0.9 %  sodium chloride infusion, 250 mL, Intravenous, Continuous, Jacky Kindle, MD, Stopped at 03/15/22 0904 ?  0.9 %  sodium chloride infusion, ,  Intravenous, Continuous, Samtani, Jai-Gurmukh, MD, Last Rate: 10 mL/hr at 03/22/22 0906, Rate Change at 03/22/22 0906 ?  acetaminophen (TYLENOL) tablet 650 mg, 650 mg, Oral, Q4H PRN, 650 mg at 03/21/22 2008 **OR** acetaminophen (TYLENOL) 160 MG/5ML solution 650 mg, 650 mg, Per Tube, Q4H PRN, 650 mg at 03/22/22 0516 **OR** acetaminophen (TYLENOL) suppository 650 mg, 650 mg, Rectal, Q4H PRN, Luanne Bras, MD, 650 mg at 03/08/22 0035 ?  arformoterol (BROVANA) nebulizer solution 15 mcg, 15 mcg, Nebulization, BID, Simonne Maffucci B, MD, 15 mcg at 03/22/22 7116 ?  aspirin chewable tablet 81 mg, 81 mg, Per Tube, Daily, de Yolanda Manges, [place] E, NP, 81 mg at 03/22/22 0831 ?  atorvastatin (LIPITOR) tablet 40 mg, 40 mg, Per Tube, Daily, de Yolanda Manges, Cortney E, NP, 40 mg at 03/22/22 0831 ?  bisacodyl (DULCOLAX) suppository 10 mg, 10 mg, Rectal, Daily PRN, Aslam, Sadia, MD, 10 mg at 03/15/22 0343 ?  budesonide (PULMICORT) nebulizer solution 0.5 mg, 0.5 mg, Nebulization, BID, McQuaid, Douglas B, MD,  0.5 mg at 03/22/22 0844 ?  chlorhexidine gluconate (MEDLINE KIT) (PERIDEX) 0.12 % solution 15 mL, 15 mL, Mouth Rinse, BID, Chand, Sudham, MD, 15 mL at 03/22/22 5790 ?  Chlorhexidine Gluconate Cloth 2 % PADS 6 each, 6 each, Topical, Q0600, Luanne Bras, MD, 6 each at 03/21/22 0548 ?  clopidogrel (PLAVIX) tablet 75 mg, 75 mg, Per Tube, Daily, de Yolanda Manges, Cortney E, NP, 75 mg at 03/22/22 0831 ?  docusate (COLACE) 50 MG/5ML liquid 100 mg, 100 mg, Per Tube, BID, Agarwala, Ravi, MD, 100 mg at 03/21/22 2008 ?  enoxaparin (LOVENOX) injection 40 mg, 40 mg, Subcutaneous, Q24H, Donnetta Simpers, MD, 40 mg at 03/22/22 0831 ?  feeding supplement (OSMOLITE 1.5 CAL) liquid 1,000 mL, 1,000 mL, Per Tube, Continuous, Chand, Sudham, MD, Last Rate: 55 mL/hr at 03/21/22 1445, 1,000 mL at 03/21/22 1445 ?  feeding supplement (PROSource TF) liquid 90 mL, 90 mL, Per Tube, BID, Jacky Kindle, MD, 90 mL at 03/22/22 0830 ?  folic acid (FOLVITE) tablet 1 mg, 1 mg, Per Tube, Daily, Franky Macho, RPH, 1 mg at 03/22/22 0831 ?  free water 200 mL, 200 mL, Per Tube, Q6H, Samtani, Jai-Gurmukh, MD, 200 mL at 03/22/22 0600 ?  glycopyrrolate (ROBINUL) tablet 1 mg, 1 mg, Oral, BID, Minor, Grace Bushy, NP, 1 mg at 03/22/22 0831 ?  guaiFENesin (ROBITUSSIN) 100 MG/5ML liquid 15 mL, 15 mL, Per Tube, Q6H, Agarwala, Ravi, MD, 15 mL at 03/22/22 0516 ?  hydrALAZINE (APRESOLINE) injection 10-40 mg, 10-40 mg, Intravenous, Q4H PRN, Rosalin Hawking, MD, 20 mg at 03/08/22 1707 ?  levETIRAcetam (KEPPRA) 100 MG/ML solution 500 mg, 500 mg, Per Tube, BID, Jesusita Oka, MD, 500 mg at 03/22/22 0831 ?  LORazepam (ATIVAN) injection 1 mg, 1 mg, Intravenous, Q4H PRN, Jacky Kindle, MD, 1 mg at 03/12/22 0243 ?  MEDLINE mouth rinse, 15 mL, Mouth Rinse, QID, Garvin Fila, MD, 15 mL at 03/22/22 3833 ?  multivitamin with minerals tablet 1 tablet, 1 tablet, Per Tube, Daily, Jacky Kindle, MD, 1 tablet at 03/22/22 0831 ?  nicotine (NICODERM CQ - dosed in mg/24 hours) patch  14 mg, 14 mg, Transdermal, Daily, Minor, Grace Bushy, NP, 14 mg at 03/22/22 3832 ?  pantoprazole sodium (PROTONIX) 40 mg/20 mL oral suspension 40 mg, 40 mg, Per Tube, Q24H, Franky Macho, RPH, 40 mg at 03/21/22 2009 ?  polyethylene glycol (MIRALAX / GLYCOLAX) packet 17 g, 17 g, Per Tube, Daily PRN, Samtani,  Jai-Gurmukh, MD ?  QUEtiapine (SEROQUEL) tablet 25 mg, 25 mg, Oral, QHS, Samtani, Jai-Gurmukh, MD, 25 mg at 03/21/22 2009 ?  revefenacin (YUPELRI) nebulizer solution 175 mcg, 175 mcg, Nebulization, Daily, Simonne Maffucci B, MD, 175 mcg at 03/22/22 4315 ?  thiamine tablet 100 mg, 100 mg, Per Tube, Daily, Franky Macho, RPH, 100 mg at 03/22/22 0831 ?  ?Patients Current Diet:  ?Diet Order   ?  ?         ?    Diet - low sodium heart healthy       ?  ?    Diet NPO time specified Except for: Other (See Comments)  Diet effective now       ?  ?  ?   ?  ?  ?   ?  ?  ?Precautions / Restrictions ?Precautions ?Precautions: Fall ?Precaution Comments: posey belt, wrist restraint on L, Bil mitts, R inattention, ?Restrictions ?Weight Bearing Restrictions: No  ?  ?Has the patient had 2 or more falls or a fall with injury in the past year? No ?  ?Prior Activity Level ?Limited Community (1-2x/wk): per cousin, out for appointments, not driving, assist for ADLs (tub transfer specifically), mod I without DME ?  ?Prior Functional Level ?Self Care: Did the patient need help bathing, dressing, using the toilet or eating? Needed some help ?  ?Indoor Mobility: Did the patient need assistance with walking from room to room (with or without device)? Independent ?  ?Stairs: Did the patient need assistance with internal or external stairs (with or without device)? Needed some help ?  ?Functional Cognition: Did the patient need help planning regular tasks such as shopping or remembering to take medications? Needed some help ?  ?Patient Information ?Are you of Hispanic, Latino/a,or Spanish origin?: A. No, not of Hispanic, Latino/a, or Spanish  origin, X. Patient unable to respond (proxy) ?What is your race?: A. White, X. Patient unable to respond (proxy) ?Do you need or want an interpreter to communicate with a doctor or health care staff?: 0.

## 2022-03-22 NOTE — H&P (Signed)
?  ?Physical Medicine and Rehabilitation Admission H&P ?  ?  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  ?HPI: Todd Estrada is a 61 year old male whose roommate witnessed seizure-like activity at home on 03/02/2022. EMS called and transported to [facility] ED. Alert but appeared confused and history significant for previous left MCA stroke on aspirin therapy. Slurred speech and right-sided weakness noted. Code stroke initiated and neurology consulted. Dr. Sarita Haver evaluated and patient not tPa candidate due to unknown LKW. CTA concerning for occlusion of left ICA and IR consulted. He underwent embolectomy and balloon angioplasty by Dr. Estanislado Pandy on 3/15. Given Brilinta and aspirin and extubated. Agitated post-procedure given Ativan prn. History given for excessive alcohol use and monitored for withdrawal. EEG performed 3/16. Noted to be obtunded and aspirating and CCM consulted with re-intubation and mechanical ventilation with evidence of acute hypoxic respiratory failure with aspiration pneumonia. Flexible bronchoscopy on 3/16 as well. OGT placed and TFs began. MRI carried out on 3/17 with findings worrisome for acute left MCA territory infarcts predominately involving the left frontal, parietal and temporal lobes with associated edema and sulcal effacement without midline shift, chronically occluded left vertebral artery occlusion. Extubated again on 3/18 but required re-intubation two hours later. He self extubated the following day on 3/19. NRB mask placed. Required Precedex for agitation. Traumatic nosebleed secondary to NGT placement; Rhino Rocket placed. Precedex stopped, Rocket removed and Cortrak planned. Re-intubated 3/21 secondary to aspiration and lack of gag response.  Percutaneous tracheostomy on 3/22. Could not get Cortrak placed and bedside PEG placement performed on 3/24. Plavix started and aspirin continued. Off  mechanical ventilation on 3/26. Tolerating trach collar for > 48 hours. Trach changed to cuffless #6 on 3/30. PMV with supervision. ?  ?  ?2D echo: EF 4-45% ?LDL = 60 ?A1c = 5.4 ?Review of Systems  ?Reason unable to perform ROS: limited due to trach.  And sedation ?    ?Past Medical History:  ?Diagnosis Date  ? Hypertension    ?  ?     ?Past Surgical History:  ?Procedure Laterality Date  ? ESOPHAGOGASTRODUODENOSCOPY (EGD) WITH PROPOFOL N/A 03/11/2022  ?  Procedure: ESOPHAGOGASTRODUODENOSCOPY (EGD) WITH PROPOFOL;  Surgeon: Georganna Skeans, MD;  Location: [place];  Service: General;  Laterality: N/A;  ? IR ANGIO INTRA EXTRACRAN SEL COM CAROTID INNOMINATE UNI R MOD SED   03/02/2022  ? IR CT HEAD LTD   03/02/2022  ? IR PERCUTANEOUS ART THROMBECTOMY/INFUSION INTRACRANIAL INC DIAG ANGIO   03/02/2022  ? PEG PLACEMENT N/A 03/11/2022  ?  Procedure: PERCUTANEOUS ENDOSCOPIC GASTROSTOMY (PEG) PLACEMENT;  Surgeon: Georganna Skeans, MD;  Location: [place];  Service: General;  Laterality: N/A;  ? RADIOLOGY WITH ANESTHESIA N/A 03/02/2022  ?  Procedure: IR WITH ANESTHESIA;  Surgeon: Luanne Bras, MD;  Location: [place];  Service: Radiology;  Laterality: N/A;  ?  ?History reviewed. No pertinent family history. ?Social History:  reports that he has been smoking. He has never used smokeless tobacco. He reports current alcohol use. He reports that he does not use drugs. ?Allergies: No Known Allergies ?      ?Medications Prior to Admission  ?Medication Sig Dispense Refill  ? aspirin EC 81 MG EC tablet Take 1 tablet (81 mg total) by mouth daily. Swallow whole. (Patient not taking: Reported on 03/07/2022) 30 tablet 11  ? atorvastatin (LIPITOR) 80 MG tablet Take 1 tablet (80 mg total) by mouth daily. (Patient  not taking: Reported on 03/07/2022) 30 tablet 1  ? metoprolol tartrate (LOPRESSOR) 25 MG tablet Take 0.5 tablets (12.5 mg total) by mouth 2 (two) times daily. (Patient not taking: Reported on 03/07/2022) 30 tablet 1  ?  ?  ?  ?   ?Home: ?Home Living ?Family/patient expects to be discharged to:: Private residence ?Living Arrangements: Other relatives (cousin and uncle) ?Available Help at Discharge: Family ?Type of Home: House ?Home Access: Stairs to enter ?Entrance Stairs-Number of Steps: 2 ?Home Layout: One level ?Bathroom Shower/Tub: Tub/shower unit ?Bathroom Toilet: Standard ?Bathroom Accessibility: No ?Home Equipment: Conservation officer, nature (2 wheels), [employer] - single point, BSC/3in1, Wheelchair - manual ?Additional Comments: home information from prior admission, pt unable to state ?  ?Functional History: ?Prior Function ?Prior Level of Function : Independent/Modified Independent ?Mobility Comments: unable to state, noted had residual R side weaknes from prior stroke and aphasia; today grunts to questions through venturi mask ?  ?Functional Status:  ?Mobility: ?Bed Mobility ?Overal bed mobility: Needs Assistance ?Bed Mobility: Supine to Sit, Sit to Supine ?Rolling: Mod assist ?Supine to sit: Min assist ?Sit to supine: Min assist ?General bed mobility comments: Cues for safety and assist for lines ?Transfers ?Overall transfer level: Needs assistance ?Equipment used: 2 person hand held assist, Rolling walker (2 wheels) ?Transfers: Sit to/from Stand, Bed to chair/wheelchair/BSC ?Sit to Stand: Mod assist ?Bed to/from chair/wheelchair/BSC transfer type:: Step pivot, Stand pivot ?Stand pivot transfers: Mod assist, +2 physical assistance ?Squat pivot transfers: Mod assist, +2 safety/equipment ?Step pivot transfers: Mod assist, +2 physical assistance ?General transfer comment: Performed sit to stand x 6 during session - attempted with RW but R hand unable to maintain grip so switched to HHA.  Step pivot to [facility] but R leg buckling and pt requiring max cues and mod A to maintain balance.  Stand pivot back to bed mod A of 2 for safety ?Ambulation/Gait ?Ambulation/Gait assistance: Mod assist, +2 physical assistance ?Gait Distance (Feet): 3 Feet ?Assistive  device: 2 person hand held assist ?Gait Pattern/deviations: Step-to pattern, Decreased stride length ?General Gait Details: Side steps at EOB but pt fatigues easily, legs buckling, requiring mod A x 2 and max cues ?Gait velocity interpretation: <1.31 ft/sec, indicative of household ambulator ?  ?ADL: ?ADL ?Overall ADL's : Needs assistance/impaired ?Functional mobility during ADLs:  (unable to maintain level of arousal to attmept mobility this date) ?General ADL Comments: Max A with UB bathing and washing face ?  ?Cognition: ?Cognition ?Overall Cognitive Status: No family/caregiver present to determine baseline cognitive functioning ?Orientation Level: Intubated/Tracheostomy - Unable to assess ?Cognition ?Arousal/Alertness: Lethargic ?Behavior During Therapy: Flat affect ?Overall Cognitive Status: No family/caregiver present to determine baseline cognitive functioning ?Area of Impairment: Following commands, Awareness, Memory, Attention, Safety/judgement, Problem solving ?Current Attention Level: Focused ?Memory: Decreased short-term memory, Decreased recall of precautions ?Following Commands: Follows one step commands inconsistently ?Safety/Judgement: Decreased awareness of deficits, Decreased awareness of safety ?Awareness: Intellectual ?Problem Solving: Slow processing ?General Comments: Pt following 50% of basic commands; flat affect and quiet today - tech reports pt was much more talkative and alert last session ?Difficult to assess due to: Tracheostomy, Level of arousal ?  ?Physical Exam: ?Blood pressure 107/77, pulse 75, temperature 98.1 ?F (36.7 ?C), temperature source Oral, resp. rate 20, height 6' (1.829 m), weight 81.3 kg, SpO2 98 %. ?Physical Exam ?Vitals and nursing note reviewed. Exam conducted with a chaperone present.  ?Constitutional:   ?   General: He is not in acute distress. ?   Comments: Pt intermittently  opened eyes, however kept them closed, not interactive for most of interview; nurse at  bedside; in mittens b/l and posey waist restraint, NAD ?Large beard- has dried white substance in beard  ?HENT:  ?   Head: Normocephalic and atraumatic.  ?   Comments: R facial droop- tongue covered with barium from p

## 2022-03-22 NOTE — Plan of Care (Signed)
Pt is alert, pt has tracheostomy, difficult to understand pts speech. Pt able to move all extremities. Pt has condom cath. Pt turned and repositioned but pt positioned self back. Tube feedings continued per order. Pt has abdominal restrain in place and left wrist restraint. Pt did managed to pull at tube feeding tubing and also moving legs off bed. Pt had two bowel movement. No distress noted.  ? ? ?Problem: Safety: ?Goal: Non-violent Restraint(s) ?Outcome: Progressing ?  ?Problem: Education: ?Goal: Knowledge of disease or condition will improve ?Outcome: Progressing ?Goal: Knowledge of secondary prevention will improve (SELECT ALL) ?Outcome: Progressing ?Goal: Knowledge of patient specific risk factors will improve (INDIVIDUALIZE FOR PATIENT) ?Outcome: Progressing ?Goal: Individualized Educational Video(s) ?Outcome: Progressing ?  ?Problem: Coping: ?Goal: Will verbalize positive feelings about self ?Outcome: Progressing ?Goal: Will identify appropriate support needs ?Outcome: Progressing ?  ?Problem: Health Behavior/Discharge Planning: ?Goal: Ability to manage health-related needs will improve ?Outcome: Progressing ?  ?Problem: Self-Care: ?Goal: Ability to participate in self-care as condition permits will improve ?Outcome: Progressing ?Goal: Verbalization of feelings and concerns over difficulty with self-care will improve ?Outcome: Progressing ?Goal: Ability to communicate needs accurately will improve ?Outcome: Progressing ?  ?Problem: Nutrition: ?Goal: Risk of aspiration will decrease ?Outcome: Progressing ?Goal: Dietary intake will improve ?Outcome: Progressing ?  ?Problem: Ischemic Stroke/TIA Tissue Perfusion: ?Goal: Complications of ischemic stroke/TIA will be minimized ?Outcome: Progressing ?  ?

## 2022-03-23 ENCOUNTER — Inpatient Hospital Stay (HOSPITAL_COMMUNITY): Payer: Medicaid Other

## 2022-03-23 DIAGNOSIS — R609 Edema, unspecified: Secondary | ICD-10-CM

## 2022-03-23 DIAGNOSIS — I63512 Cerebral infarction due to unspecified occlusion or stenosis of left middle cerebral artery: Secondary | ICD-10-CM | POA: Diagnosis not present

## 2022-03-23 DIAGNOSIS — L0390 Cellulitis, unspecified: Secondary | ICD-10-CM

## 2022-03-23 LAB — CBC WITH DIFFERENTIAL/PLATELET
Abs Immature Granulocytes: 0.07 10*3/uL (ref 0.00–0.07)
Basophils Absolute: 0.1 10*3/uL (ref 0.0–0.1)
Basophils Relative: 1 %
Eosinophils Absolute: 0.5 10*3/uL (ref 0.0–0.5)
Eosinophils Relative: 3 %
HCT: 42.6 % (ref 39.0–52.0)
Hemoglobin: 13.9 g/dL (ref 13.0–17.0)
Immature Granulocytes: 1 %
Lymphocytes Relative: 12 %
Lymphs Abs: 1.8 10*3/uL (ref 0.7–4.0)
MCH: 30.1 pg (ref 26.0–34.0)
MCHC: 32.6 g/dL (ref 30.0–36.0)
MCV: 92.2 fL (ref 80.0–100.0)
Monocytes Absolute: 0.9 10*3/uL (ref 0.1–1.0)
Monocytes Relative: 6 %
Neutro Abs: 11.6 10*3/uL — ABNORMAL HIGH (ref 1.7–7.7)
Neutrophils Relative %: 77 %
Platelets: 410 10*3/uL — ABNORMAL HIGH (ref 150–400)
RBC: 4.62 MIL/uL (ref 4.22–5.81)
RDW: 13.2 % (ref 11.5–15.5)
WBC: 14.9 10*3/uL — ABNORMAL HIGH (ref 4.0–10.5)
nRBC: 0 % (ref 0.0–0.2)

## 2022-03-23 LAB — COMPREHENSIVE METABOLIC PANEL
ALT: 61 U/L — ABNORMAL HIGH (ref 0–44)
AST: 40 U/L (ref 15–41)
Albumin: 2.5 g/dL — ABNORMAL LOW (ref 3.5–5.0)
Alkaline Phosphatase: 93 U/L (ref 38–126)
Anion gap: 8 (ref 5–15)
BUN: 24 mg/dL — ABNORMAL HIGH (ref 8–23)
CO2: 25 mmol/L (ref 22–32)
Calcium: 8.6 mg/dL — ABNORMAL LOW (ref 8.9–10.3)
Chloride: 102 mmol/L (ref 98–111)
Creatinine, Ser: 0.76 mg/dL (ref 0.61–1.24)
GFR, Estimated: >60 mL/min (ref >60)
Glucose, Bld: 130 mg/dL — ABNORMAL HIGH (ref 70–99)
Potassium: 4.2 mmol/L (ref 3.5–5.1)
Sodium: 135 mmol/L (ref 135–145)
Total Bilirubin: 0.3 mg/dL (ref 0.3–1.2)
Total Protein: 6.6 g/dL (ref 6.5–8.1)

## 2022-03-23 LAB — GLUCOSE, CAPILLARY
Glucose-Capillary: 124 mg/dL — ABNORMAL HIGH (ref 70–99)
Glucose-Capillary: 130 mg/dL — ABNORMAL HIGH (ref 70–99)
Glucose-Capillary: 146 mg/dL — ABNORMAL HIGH (ref 70–99)

## 2022-03-23 IMAGING — DX DG CHEST 1V PORT
1 series · 1 of 1 positions shown · non-contrast
Comparison: [DATE], [DATE]

CLINICAL DATA: Abnormal respirations, cough and congestion

EXAM:
PORTABLE CHEST 1 VIEW

[chest ap]
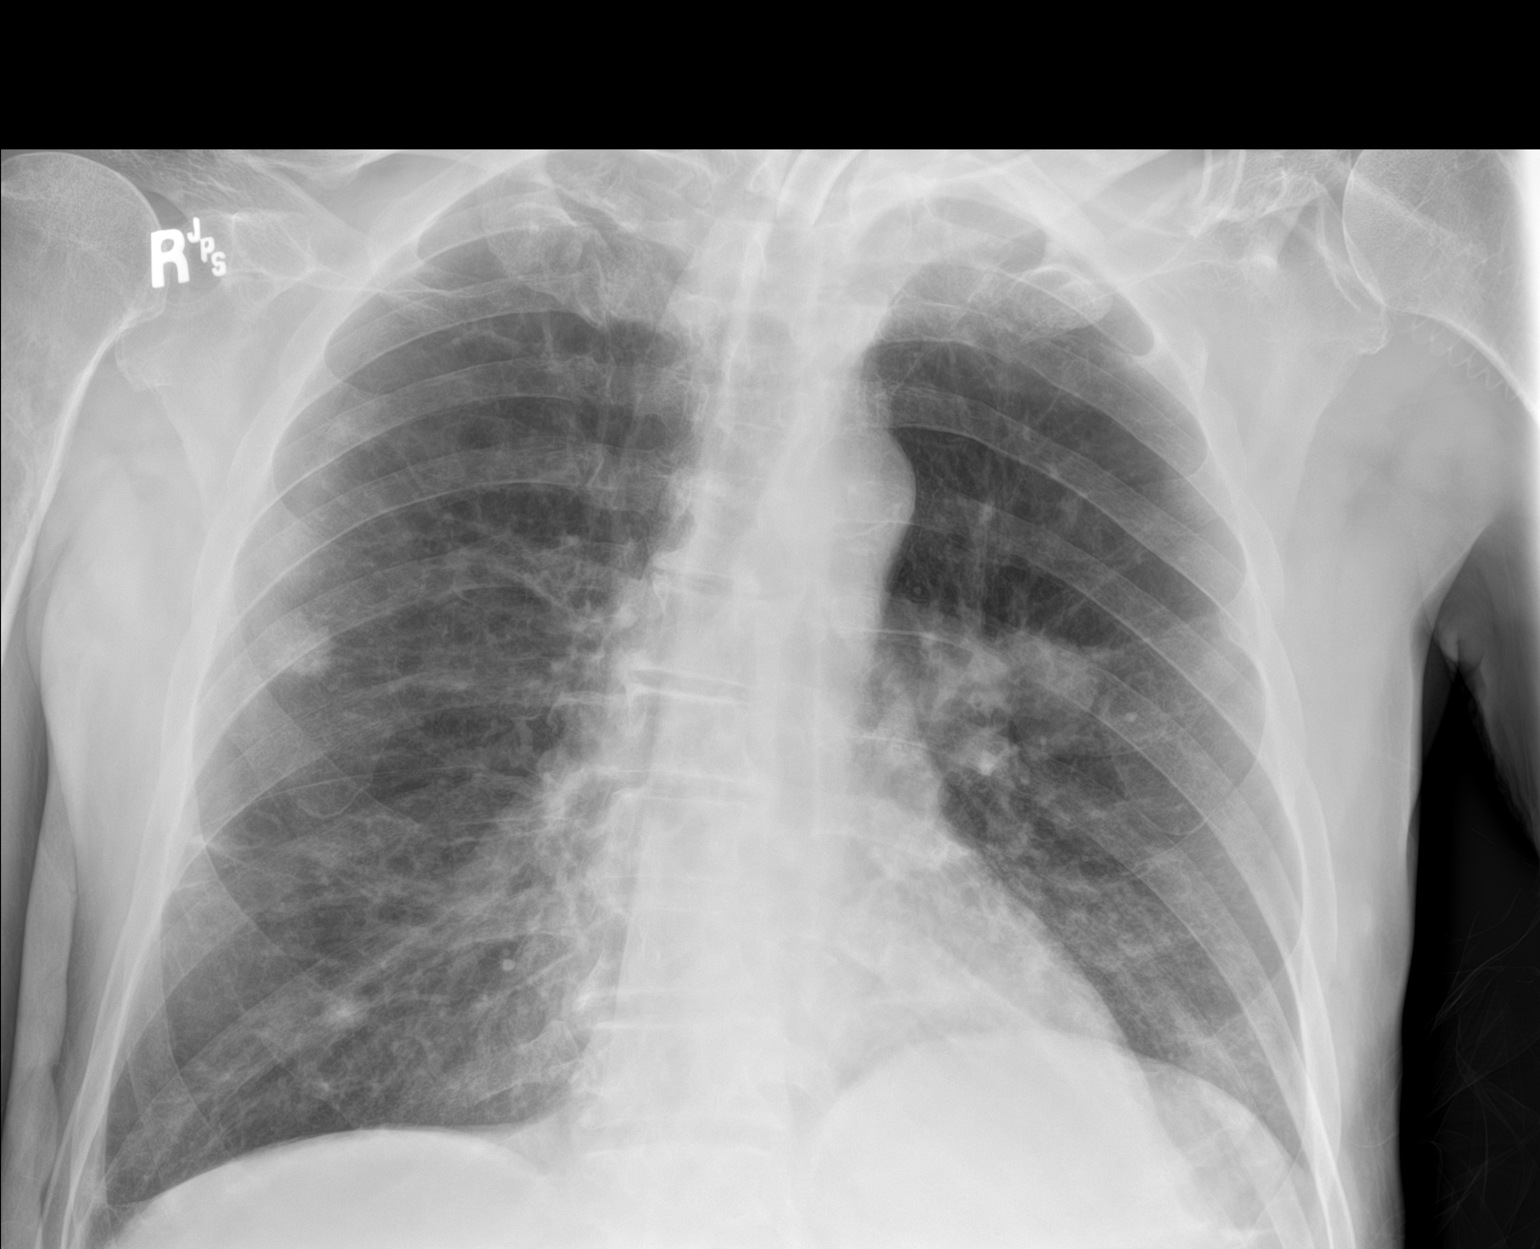

[1 of 1 positions shown; findings below may reference images not displayed]

FINDINGS: Tracheostomy in the upper trachea just past the thoracic inlet.

Hyperinflation with background emphysema.

Left upper lobe perihilar mass and right mid and lower lung nodules
again noted better demonstrated by comparison chest CT.

No enlarging effusion or pneumothorax. No new superimposed acute
airspace process or pneumonia.

Trachea midline.  Aorta atherosclerotic.  No acute osseous finding.
IMPRESSION: Stable hyperinflation compatible with COPD/emphysema.

Similar left perihilar mass and right lung nodules, again concerning
for malignancy. Please refer to the recent chest CT.

No superimposed acute chest process by plain radiography.

## 2022-03-23 MED ORDER — FREE WATER
200.0000 mL | Freq: Four times a day (QID) | Status: DC
  Administered 2022-03-23 – 2022-04-14 (×87): 200 mL

## 2022-03-23 MED ORDER — CHLORHEXIDINE GLUCONATE 0.12 % MT SOLN
15.0000 mL | Freq: Two times a day (BID) | OROMUCOSAL | Status: DC
  Administered 2022-03-23 – 2022-04-15 (×42): 15 mL via OROMUCOSAL
  Filled 2022-03-23 (×45): qty 15

## 2022-03-23 MED ORDER — LORAZEPAM 0.5 MG PO TABS
0.5000 mg | ORAL_TABLET | Freq: Four times a day (QID) | ORAL | Status: DC | PRN
Start: 2022-03-23 — End: 2022-04-15

## 2022-03-23 MED ORDER — ORAL CARE MOUTH RINSE: 15.0000 mL | OROMUCOSAL | Status: DC

## 2022-03-23 MED ORDER — LORAZEPAM 2 MG/ML IJ SOLN: 0.5000 mg | Freq: Four times a day (QID) | INTRAMUSCULAR | Status: DC | PRN

## 2022-03-23 MED ORDER — PROSOURCE TF PO LIQD
45.0000 mL | Freq: Two times a day (BID) | ORAL | Status: DC
  Administered 2022-03-23 – 2022-04-06 (×28): 45 mL
  Filled 2022-03-23 (×25): qty 45

## 2022-03-23 MED ORDER — OSMOLITE 1.5 CAL PO LIQD
356.0000 mL | Freq: Four times a day (QID) | ORAL | Status: DC
  Administered 2022-03-23 – 2022-03-30 (×26): 356 mL
  Filled 2022-03-23 (×8): qty 474

## 2022-03-23 MED ORDER — GLYCOPYRROLATE 1 MG PO TABS
1.0000 mg | ORAL_TABLET | Freq: Two times a day (BID) | ORAL | Status: DC
  Administered 2022-03-23 – 2022-03-28 (×10): 1 mg
  Filled 2022-03-23 (×11): qty 1

## 2022-03-23 NOTE — Evaluation (Signed)
Occupational Therapy Assessment and Plan ? ?Patient Details  ?Name: Todd Estrada ?MRN: [id number] ?Date of Birth: 1961/04/23 ? ?OT Diagnosis: abnormal posture, apraxia, ataxia, cognitive deficits, disturbance of vision, hemiplegia affecting dominant side, and muscle weakness (generalized) ?Rehab Potential: Rehab Potential (ACUTE ONLY): Fair ?ELOS: 3.5 - 4 weeks  ? ?Today's Date: 03/23/2022 ?OT Individual Time: 0904-1000 ?OT Individual Time Calculation (min): 56 min   +  23 min ? ?Hospital Problem: Principal Problem: ?  Acute ischemic left MCA stroke ([place]) ? ? ?Past Medical History:  ?Past Medical History:  ?Diagnosis Date  ? Hypertension   ? ?Past Surgical History:  ?Past Surgical History:  ?Procedure Laterality Date  ? ESOPHAGOGASTRODUODENOSCOPY (EGD) WITH PROPOFOL N/A 03/11/2022  ? Procedure: ESOPHAGOGASTRODUODENOSCOPY (EGD) WITH PROPOFOL;  Surgeon: Georganna Skeans, MD;  Location: [place];  Service: General;  Laterality: N/A;  ? IR ANGIO INTRA EXTRACRAN SEL COM CAROTID INNOMINATE UNI R MOD SED  03/02/2022  ? IR CT HEAD LTD  03/02/2022  ? IR PERCUTANEOUS ART THROMBECTOMY/INFUSION INTRACRANIAL INC DIAG ANGIO  03/02/2022  ? PEG PLACEMENT N/A 03/11/2022  ? Procedure: PERCUTANEOUS ENDOSCOPIC GASTROSTOMY (PEG) PLACEMENT;  Surgeon: Georganna Skeans, MD;  Location: [place];  Service: General;  Laterality: N/A;  ? RADIOLOGY WITH ANESTHESIA N/A 03/02/2022  ? Procedure: IR WITH ANESTHESIA;  Surgeon: Luanne Bras, MD;  Location: [place];  Service: Radiology;  Laterality: N/A;  ? ? ?Assessment & Plan ?Clinical Impression: Patient is a 61 y.o. year old male whose roommate witnessed seizure-like activity at home on 03/02/2022. EMS called and transported to [facility] ED. Alert but appeared confused and history significant for previous left MCA stroke on aspirin therapy. Slurred speech and right-sided weakness noted. Code stroke initiated and neurology consulted. Dr. Sarita Haver evaluated and patient not tPa candidate due to unknown  LKW. CTA concerning for occlusion of left ICA and IR consulted. He underwent embolectomy and balloon angioplasty by Dr. Estanislado Pandy on 3/15. Given Brilinta and aspirin and extubated. Agitated post-procedure given Ativan prn. History given for excessive alcohol use and monitored for withdrawal. EEG performed 3/16. Noted to be obtunded and aspirating and CCM consulted with re-intubation and mechanical ventilation with evidence of acute hypoxic respiratory failure with aspiration pneumonia. Flexible bronchoscopy on 3/16 as well. OGT placed and TFs began. MRI carried out on 3/17 with findings worrisome for acute left MCA territory infarcts predominately involving the left frontal, parietal and temporal lobes with associated edema and sulcal effacement without midline shift, chronically occluded left vertebral artery occlusion. Extubated again on 3/18 but required re-intubation two hours later. He self extubated the following day on 3/19. NRB mask placed. Required Precedex for agitation. Traumatic nosebleed secondary to NGT placement; Rhino Rocket placed. Precedex stopped, Rocket removed and Cortrak planned. Re-intubated 3/21 secondary to aspiration and lack of gag response.  Percutaneous tracheostomy on 3/22. Could not get Cortrak placed and bedside PEG placement performed on 3/24. Plavix started and aspirin continued. Off mechanical ventilation on 3/26. Tolerating trach collar for > 48 hours. Trach changed to cuffless #6 on 3/30. PMV with supervision..  Patient transferred to CIR on 03/22/2022 .   ? ?Patient currently requires total with basic self-care skills secondary to muscle weakness, decreased cardiorespiratoy endurance, impaired timing and sequencing, abnormal tone, unbalanced muscle activation, motor apraxia, ataxia, decreased coordination, and decreased motor planning, decreased visual perceptual skills and decreased visual motor skills, decreased midline orientation, decreased attention to right, right side  neglect, decreased motor planning, and ideational apraxia, decreased initiation, decreased attention, decreased awareness, decreased  problem solving, decreased safety awareness, decreased memory, and delayed processing, and decreased sitting balance, decreased standing balance, decreased postural control, hemiplegia, and decreased balance strategies.  Prior to hospitalization, patient could complete BADL/IADL/mobility with  mod I to S per chart review . ? ?Patient will benefit from skilled intervention to decrease level of assist with basic self-care skills and increase independence with basic self-care skills prior to discharge home with care partner.  Anticipate patient will require 24 hour supervision and follow up home health. ? ?OT - End of Session ?Activity Tolerance: Tolerates 10 - 20 min activity with multiple rests ?Endurance Deficit: Yes ?OT Assessment ?Rehab Potential (ACUTE ONLY): Fair ?OT Barriers to Discharge: Insurance for SNF coverage;Incontinence;Trach;Decreased caregiver support;Nutrition means ?OT Patient demonstrates impairments in the following area(s): Balance;Behavior;Cognition;Endurance;Motor;Nutrition;Perception;Safety;Vision;Sensory;Skin Integrity ?OT Basic ADL's Functional Problem(s): Grooming;Bathing;Dressing;Toileting ?OT Transfers Functional Problem(s): Toilet;Tub/Shower ?OT Additional Impairment(s): Fuctional Use of Upper Extremity ?OT Plan ?OT Intensity: Minimum of 1-2 x/day, 45 to 90 minutes ?OT Frequency: 5 out of 7 days ?OT Duration/Estimated Length of Stay: 3.5 - 4 weeks ?OT Treatment/Interventions: Balance/vestibular training;Disease mangement/prevention;Neuromuscular re-education;Self Care/advanced ADL retraining;Therapeutic Exercise;Wheelchair propulsion/positioning;UE/LE Strength taining/ROM;Skin care/wound managment;Pain management;DME/adaptive equipment instruction;Cognitive remediation/compensation;Community reintegration;Functional electrical stimulation;Patient/family  education;Splinting/orthotics;UE/LE Coordination activities;Visual/perceptual remediation/compensation;Therapeutic Activities;Psychosocial support;Functional mobility training;Discharge planning ?OT Self Feeding Anticipated Outcome(s): S/ NPO ?OT Basic Self-Care Anticipated Outcome(s): min A ?OT Toileting Anticipated Outcome(s): min A ?OT Bathroom Transfers Anticipated Outcome(s): min A ?OT Recommendation ?Patient destination: Home ?Follow Up Recommendations: Skilled nursing facility;24 hour supervision/assistance ?Equipment Recommended: To be determined ? ? ?OT Evaluation ?Precautions/Restrictions  ?Precautions ?Precautions: Fall ?Precaution Comments: posey belt, Bil mitts, R inattention, trach ?Restrictions ?Weight Bearing Restrictions: No ?General ?Chart Reviewed: Yes ?Response to Previous Treatment: Patient unable to report, no changes reported from family or staff ?Family/Caregiver Present: No ?Vital Signs ?Therapy Vitals ?Pulse Rate: 95 ?Resp: 18 ?Patient Position (if appropriate): Lying ?Oxygen Therapy ?SpO2: 99 % ?O2 Device: Tracheostomy Collar;Simple Mask ?O2 Flow Rate (L/min): 5 L/min ?FiO2 (%): 21 % ?Pain ?Pain Assessment ?Pain Scale: Faces ?Faces Pain Scale: No hurt ?PAINAD (Pain Assessment in Advanced Dementia) ?Breathing: normal ?Negative Vocalization: none ?Facial Expression: smiling or inexpressive ?Body Language: relaxed ?Consolability: no need to console ?PAINAD Score: 0 ?Home Living/Prior Functioning ?Home Living ?Available Help at Discharge: Family ?Type of Home: House ?Home Access: Stairs to enter ?Entrance Stairs-Number of Steps: 2 ?Home Layout: One level ?Bathroom Shower/Tub: Tub/shower unit ?Bathroom Toilet: Standard ?Bathroom Accessibility: No ?Additional Comments: taken from chart review ? Lives With: Family, Other (Comment) (Pt to discharge to cousin) ?IADL History ?Homemaking Responsibilities:  (pt unable to state to confirm PLOF) ?IADL Comments: per cousin, out for appointments, not  driving, assist for ADLs (tub transfer specifically), mod I without DME ?Prior Function ?Level of Independence: Requires assistive device for independence ?Driving: No ?Vision ?Baseline Vision/History: 2 Legally b

## 2022-03-23 NOTE — Progress Notes (Signed)
Was present in room with attendant RN and RT.  Dr. Erin Fulling also present after trach care completed. ? ?Consulted VVS, Dr. Virl Cagey. Reviewed chart/clinical course. Rec: ABIs and follow-up as outpatient. ?

## 2022-03-23 NOTE — Evaluation (Signed)
Physical Therapy Assessment and Plan ? ?Patient Details  ?Name: Todd Estrada ?MRN: [id number] ?Date of Birth: 16-Jun-1961 ? ?PT Diagnosis: Abnormal posture, Abnormality of gait, Coordination disorder, Hemiplegia dominant, Impaired cognition, Impaired sensation, and Muscle weakness ?Rehab Potential: Fair ?ELOS: 24-28day  ? ?Today's Date: 03/23/2022 ?PT Individual Time: 1100-1155 ?PT Individual Time Calculation (min): 55 min   ? ?Hospital Problem: Principal Problem: ?  Acute ischemic left MCA stroke ([place]) ? ? ?Past Medical History:  ?Past Medical History:  ?Diagnosis Date  ? Hypertension   ? ?Past Surgical History:  ?Past Surgical History:  ?Procedure Laterality Date  ? ESOPHAGOGASTRODUODENOSCOPY (EGD) WITH PROPOFOL N/A 03/11/2022  ? Procedure: ESOPHAGOGASTRODUODENOSCOPY (EGD) WITH PROPOFOL;  Surgeon: Georganna Skeans, MD;  Location: [place];  Service: General;  Laterality: N/A;  ? IR ANGIO INTRA EXTRACRAN SEL COM CAROTID INNOMINATE UNI R MOD SED  03/02/2022  ? IR CT HEAD LTD  03/02/2022  ? IR PERCUTANEOUS ART THROMBECTOMY/INFUSION INTRACRANIAL INC DIAG ANGIO  03/02/2022  ? PEG PLACEMENT N/A 03/11/2022  ? Procedure: PERCUTANEOUS ENDOSCOPIC GASTROSTOMY (PEG) PLACEMENT;  Surgeon: Georganna Skeans, MD;  Location: [place];  Service: General;  Laterality: N/A;  ? RADIOLOGY WITH ANESTHESIA N/A 03/02/2022  ? Procedure: IR WITH ANESTHESIA;  Surgeon: Luanne Bras, MD;  Location: [place];  Service: Radiology;  Laterality: N/A;  ? ? ?Assessment & Plan ?Clinical Impression: Patient is a 61 year old male whose roommate witnessed seizure-like activity at home on 03/02/2022. EMS called and transported to [facility] ED. Alert but appeared confused and history significant for previous left MCA stroke on aspirin therapy. Slurred speech and right-sided weakness noted. Code stroke initiated and neurology consulted. Dr. Sarita Haver evaluated and patient not tPa candidate due to unknown LKW. CTA concerning for occlusion of left ICA and IR  consulted. He underwent embolectomy and balloon angioplasty by Dr. Estanislado Pandy on 3/15. Given Brilinta and aspirin and extubated. Agitated post-procedure given Ativan prn. History given for excessive alcohol use and monitored for withdrawal. EEG performed 3/16. Noted to be obtunded and aspirating and CCM consulted with re-intubation and mechanical ventilation with evidence of acute hypoxic respiratory failure with aspiration pneumonia. Flexible bronchoscopy on 3/16 as well. OGT placed and TFs began. MRI carried out on 3/17 with findings worrisome for acute left MCA territory infarcts predominately involving the left frontal, parietal and temporal lobes with associated edema and sulcal effacement without midline shift, chronically occluded left vertebral artery occlusion. Extubated again on 3/18 but required re-intubation two hours later. He self extubated the following day on 3/19. NRB mask placed. Required Precedex for agitation. Traumatic nosebleed secondary to NGT placement; Rhino Rocket placed. Precedex stopped, Rocket removed and Cortrak planned. Re-intubated 3/21 secondary to aspiration and lack of gag response.  Percutaneous tracheostomy on 3/22. Could not get Cortrak placed and bedside PEG placement performed on 3/24. Plavix started and aspirin continued. Off mechanical ventilation on 3/26. Tolerating trach collar for > 48 hours. Trach changed to cuffless #6 on 3/30. PMV with supervision.  Patient transferred to CIR on 03/22/2022 .  ? ?Patient currently requires max with mobility secondary to muscle weakness, muscle joint tightness, and muscle paralysis, decreased cardiorespiratoy endurance and decreased oxygen support, impaired timing and sequencing, abnormal tone, unbalanced muscle activation, motor apraxia, decreased coordination, and decreased motor planning, decreased visual acuity, decreased visual perceptual skills, decreased visual motor skills, and field cut, decreased attention to right and right side  neglect, decreased initiation, decreased attention, decreased awareness, decreased problem solving, decreased safety awareness, decreased memory, and delayed processing,  and decreased sitting balance, decreased standing balance, decreased postural control, hemiplegia, decreased balance strategies, and difficulty maintaining precautions.  Prior to hospitalization, patient was modified independent  with mobility and lived with Family, Other (Comment) (Pt to discharge to cousin and uncle for support? Need to confirm) in a House home.  Home access is 2Stairs to enter. ? ?Patient will benefit from skilled PT intervention to maximize safe functional mobility, minimize fall risk, and decrease caregiver burden for planned discharge home with 24 hour assist.  Anticipate patient will benefit from follow up [facility] at discharge. ? ?PT - End of Session ?Activity Tolerance: Tolerates < 10 min activity with changes in vital signs ?Endurance Deficit: Yes ?PT Assessment ?Rehab Potential (ACUTE/IP ONLY): Fair ?PT Barriers to Discharge: Decreased caregiver support;Inaccessible home environment;Home environment access/layout;Trach;Incontinence;Wound Care;Lack of/limited family support;Insurance for SNF coverage;Medication compliance;Behavior ?PT Patient demonstrates impairments in the following area(s): Balance;Behavior;Edema;Endurance;Motor;Nutrition;Pain;Perception;Safety;Sensory;Skin Integrity ?PT Transfers Functional Problem(s): Bed Mobility;Bed to Chair;Car;Floor;Furniture ?PT Locomotion Functional Problem(s): Ambulation;Wheelchair Mobility;Stairs ?PT Plan ?PT Intensity: Minimum of 1-2 x/day ,45 to 90 minutes ?PT Frequency: 5 out of 7 days ?PT Duration Estimated Length of Stay: 24-28day ?PT Treatment/Interventions: Ambulation/gait training;Discharge planning;Functional mobility training;Psychosocial support;Therapeutic Activities;Visual/perceptual remediation/compensation;Balance/vestibular training;Neuromuscular re-education;Disease  management/prevention;Skin care/wound management;Therapeutic Exercise;Wheelchair propulsion/positioning;Cognitive remediation/compensation;DME/adaptive equipment instruction;Pain management;Splinting/orthotics;UE/LE Strength taining/ROM;Community reintegration;Functional electrical stimulation;Stair training;UE/LE Coordination activities;Patient/family education ?PT Transfers Anticipated Outcome(s): CGA with LRAD ?PT Locomotion Anticipated Outcome(s): min-mod assist ambulatory with LRAD ?PT Recommendation ?Follow Up Recommendations: Skilled nursing facility ?Patient destination: [place] (SNF) ?Equipment Recommended: To be determined ? ? ?PT Evaluation ?Precautions/Restrictions ?Precautions ?Precautions: Fall ?Precaution Comments: posey belt, Bil mitts, R inattention, trach ?Restrictions ?Weight Bearing Restrictions: No ?General ?  Vital SignsTherapy Vitals ?Pulse Rate: 95 ?Resp: 18 ?Patient Position (if appropriate): Lying ?Oxygen Therapy ?SpO2: 99 % ?O2 Device: Tracheostomy Collar;Simple Mask ?O2 Flow Rate (L/min): 5 L/min ?FiO2 (%): 21 % ?Pain ?Pain Assessment ?Pain Scale: Faces ?Faces Pain Scale: No hurt ?PAINAD (Pain Assessment in Advanced Dementia) ?Breathing: normal ?Negative Vocalization: none ?Facial Expression: smiling or inexpressive ?Body Language: relaxed ?Consolability: no need to console ?PAINAD Score: 0 ?Pain Interference ?Pain Interference ?Pain Effect on Sleep: 8. Unable to answer ?Pain Interference with Therapy Activities: 8. Unable to answer ?Pain Interference with Day-to-Day Activities: 8. Unable to answer ?Home Living/Prior Functioning ?Home Living ?Available Help at Discharge: Family ?Type of Home: House ?Home Access: Stairs to enter ?Entrance Stairs-Number of Steps: 2 ?Home Layout: One level ?Bathroom Shower/Tub: Tub/shower unit ?Bathroom Toilet: Standard ?Bathroom Accessibility: No ?Additional Comments: taken from chart review ? Lives With: Family;Other (Comment) (Pt to  discharge to cousin and uncle for support? Need to confirm) ?Vision/Perception  ?Vision - Assessment ?Additional Comments: R inattention. intermittent spontaneous visual scanning to the R. ?Perception ?Perception

## 2022-03-23 NOTE — Progress Notes (Signed)
Inpatient Rehabilitation Care Coordinator ?Assessment and Plan ?Patient Details  ?Name: Todd Estrada ?MRN: [id number] ?Date of Birth: [birth year] ? ?Today's Date: 03/23/2022 ? ?Hospital Problems: Principal Problem: ?  Acute ischemic left MCA stroke ([place]) ? ?Past Medical History:  ?Past Medical History:  ?Diagnosis Date  ? Hypertension   ? ?Past Surgical History:  ?Past Surgical History:  ?Procedure Laterality Date  ? ESOPHAGOGASTRODUODENOSCOPY (EGD) WITH PROPOFOL N/A 03/11/2022  ? Procedure: ESOPHAGOGASTRODUODENOSCOPY (EGD) WITH PROPOFOL;  Surgeon: Georganna Skeans, MD;  Location: [place];  Service: General;  Laterality: N/A;  ? IR ANGIO INTRA EXTRACRAN SEL COM CAROTID INNOMINATE UNI R MOD SED  03/02/2022  ? IR CT HEAD LTD  03/02/2022  ? IR PERCUTANEOUS ART THROMBECTOMY/INFUSION INTRACRANIAL INC DIAG ANGIO  03/02/2022  ? PEG PLACEMENT N/A 03/11/2022  ? Procedure: PERCUTANEOUS ENDOSCOPIC GASTROSTOMY (PEG) PLACEMENT;  Surgeon: Georganna Skeans, MD;  Location: [place];  Service: General;  Laterality: N/A;  ? RADIOLOGY WITH ANESTHESIA N/A 03/02/2022  ? Procedure: IR WITH ANESTHESIA;  Surgeon: Luanne Bras, MD;  Location: [place];  Service: Radiology;  Laterality: N/A;  ? ?Social History:  reports that he has been smoking. He has never used smokeless tobacco. He reports current alcohol use. He reports that he does not use drugs. ? ?Family / Support Systems ?Anticipated Caregiver: Guled Gahan ?Ability/Limitations of Caregiver: min/mod ?Caregiver Availability: 24/7 ?Family Dynamics: support from cousin, mother in nursing home, uncle passed away ? ?Social History ?Preferred language: English ?Religion: Non-Denominational ?Health Literacy - How often do you need to have someone help you when you read instructions, pamphlets, or other written material from your doctor or pharmacy?: Sometimes ?Writes: Yes ?Legal History/Current Legal Issues: n/a ?Guardian/Conservator: n/a  ? ?Abuse/Neglect ?Abuse/Neglect Assessment Can Be  Completed: Unable to assess, patient is non-responsive or altered mental status ? ?Patient response to: ?Social Isolation - How often do you feel lonely or isolated from those around you?: Patient unable to respond ? ?Emotional Status ?Recent Psychosocial Issues: coping ?Psychiatric History: n/a ?Substance Abuse History: n/a ? ?Patient / Family Perceptions, Expectations & Goals ?Pt/Family understanding of illness & functional limitations: yes ?Premorbid pt/family roles/activities: patient was living woth family and receiving supervision ?Anticipated changes in roles/activities/participation: cousin, [name] plans to assist with roles and tasks after d/c ?Pt/family expectations/goals: Supervision/Min ? ?Community Resources ?Community Agencies: None ?Premorbid Home Care/DME Agencies: None ?Transportation available at discharge: family able to transport ?Is the patient able to respond to transportation needs?: No ?Resource referrals recommended: Neuropsychology ? ?Discharge Planning ?Living Arrangements: Other relatives ?Support Systems: Other relatives ?Type of Residence: Private residence ?Insurance Resources: [employer] [place]specify county) ?Financial Resources: Family Support ?Financial Screen Referred: No ?Living Expenses: Lives with family ?Money Management: Patient, Family ?Does the patient have any problems obtaining your medications?: No ?Home Management: independent ?Patient/Family Preliminary Plans: cousin able to assist ?Care Coordinator Barriers to Discharge: Insurance for SNF coverage, Lack of/limited family support, Decreased caregiver support ?Care Coordinator Anticipated Follow Up Needs: HH/OP ?Expected length of stay: 14-16 Days ? ?Clinical Impression ?SW spoke with patient cousin, [name]. Introduced self, explained role and addressed questions and concerns. Patient will discharge home with his cousin able to provide some physical assistance. Patient was previously requiring supervision. The home has 2 steps  to enter with L side handrails. No additional questions or concerns, sw will continue to follow up.  ? ?Dyanne Iha ?03/23/2022, 1:36 PM ? ?  ?

## 2022-03-23 NOTE — Progress Notes (Signed)
Initial Nutrition Assessment ? ?DOCUMENTATION CODES:  ? ?Not applicable ? ?INTERVENTION:  ?Transition to bolus tube feeds ? ?At 1800, initiate bolus feeds using Osmolite 1.5 cal formula via PEG at starting volume of 120 ml (half carton/ARC) and advance by 120 ml at each feeding until goal volume of 356 ml (1.5 cartons/ARCs) is met given QID. ? ?Provide 45 ml Prosource TF BID per tube.  ? ?Free water flush of 200 ml q 6 hours per tube.  ? ?Tube feeding regimen provides 2216 kcal, 111 grams of protein, 1882 ml free water.  ? ?NUTRITION DIAGNOSIS:  ? ?Inadequate oral intake related to inability to eat as evidenced by NPO status. ? ?GOAL:  ? ?Patient will meet greater than or equal to 90% of their needs ? ?MONITOR:  ? ?TF tolerance, Labs, Weight trends ? ?REASON FOR ASSESSMENT:  ? ?Consult ?Enteral/tube feeding initiation and management ? ?ASSESSMENT:  ? ?61 year old male with history of previous L MCA stroke presents after witnessed seizure-like activity at home. Slurred speech and right-sided weakness noted. CTA concerning for occlusion of left ICA. Pt underwent embolectomy and balloon angioplasty on 3/15. MRI 3/17 with findings worrisome for acute left MCA territory infarcts. Percutaneous tracheostomy on 3/22.  PEG placement performed on 3/24. 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 ? ?Pt on trach collar. Pt globally aphasic. Pt is continues on NPO status. Pt is currently on continuous tube feeds via PEG. RD consulted to transition feeds to bolus tube feedings. RD to modify orders. Unable to complete Nutrition-Focused physical exam at this time.  ?Labs and medications reviewed.  ? ?Diet Order:   ?Diet Order   ? ?       ?  Diet NPO time specified Except for: Other (See Comments)  Diet effective now       ?  ? ?  ?  ? ?  ? ? ?EDUCATION NEEDS:  ? ?Not appropriate for education at this time ? ?Skin:  Skin Assessment:  Skin Integrity Issues: ?Skin Integrity Issues:: DTI, Other (Comment) ?DTI: R heel ?Other: R, L black 2nd toe ? ?Last BM:  4/4 ? ?Height:  ? ?Ht Readings from Last 1 Encounters:  ?03/05/22 6' (1.829 m)  ? ? ?Weight:  ? ?Wt Readings from Last 1 Encounters:  ?03/22/22 77 kg  ? ?BMI:  Body mass index is 23.02 kg/m?. ? ?Estimated Nutritional Needs:  ? ?Kcal:  2100-2300 ? ?Protein:  105-120 grams ? ?Fluid:  >/= 2 L/day ? ?Corrin Parker, MS, RD, LDN ?RD pager number/after hours weekend pager number on Amion. ? ?

## 2022-03-23 NOTE — Plan of Care (Signed)
?Problem: RH Balance ?Goal: LTG: Patient will maintain dynamic sitting balance (OT) ?Description: LTG:  Patient will maintain dynamic sitting balance with assistance during activities of daily living (OT) ?Flowsheets (Taken 03/23/2022 1234) ?LTG: Pt will maintain dynamic sitting balance during ADLs with: Supervision/Verbal cueing ?Goal: LTG Patient will maintain dynamic standing with ADLs (OT) ?Description: LTG:  Patient will maintain dynamic standing balance with assist during activities of daily living (OT)  ?Flowsheets (Taken 03/23/2022 1234) ?LTG: Pt will maintain dynamic standing balance during ADLs with: Contact Guard/Touching assist ?  ?Problem: Sit to Stand ?Goal: LTG:  Patient will perform sit to stand in prep for activites of daily living with assistance level (OT) ?Description: LTG:  Patient will perform sit to stand in prep for activites of daily living with assistance level (OT) ?Flowsheets (Taken 03/23/2022 1234) ?LTG: PT will perform sit to stand in prep for activites of daily living with assistance level: Supervision/Verbal cueing ?  ?Problem: RH Grooming ?Goal: LTG Patient will perform grooming w/assist,cues/equip (OT) ?Description: LTG: Patient will perform grooming with assist, with/without cues using equipment (OT) ?Flowsheets (Taken 03/23/2022 1234) ?LTG: Pt will perform grooming with assistance level of: Supervision/Verbal cueing ?  ?Problem: RH Bathing ?Goal: LTG Patient will bathe all body parts with assist levels (OT) ?Description: LTG: Patient will bathe all body parts with assist levels (OT) ?Flowsheets (Taken 03/23/2022 1234) ?LTG: Pt will perform bathing with assistance level/cueing: Minimal Assistance - Patient > 75% ?  ?Problem: RH Dressing ?Goal: LTG Patient will perform upper body dressing (OT) ?Description: LTG Patient will perform upper body dressing with assist, with/without cues (OT). ?Flowsheets (Taken 03/23/2022 1234) ?LTG: Pt will perform upper body dressing with assistance level of:  Minimal Assistance - Patient > 75% ?Goal: LTG Patient will perform lower body dressing w/assist (OT) ?Description: LTG: Patient will perform lower body dressing with assist, with/without cues in positioning using equipment (OT) ?Flowsheets (Taken 03/23/2022 1234) ?LTG: Pt will perform lower body dressing with assistance level of: Minimal Assistance - Patient > 75% ?  ?Problem: RH Toileting ?Goal: LTG Patient will perform toileting task (3/3 steps) with assistance level (OT) ?Description: LTG: Patient will perform toileting task (3/3 steps) with assistance level (OT)  ?Flowsheets (Taken 03/23/2022 1234) ?LTG: Pt will perform toileting task (3/3 steps) with assistance level: Moderate Assistance - Patient 50 - 74% ?  ?Problem: RH Vision ?Goal: RH LTG Vision (Specify) ?Flowsheets (Taken 03/23/2022 1234) ?LTG: Vision Goals: Pt will locate desired ADL items on his R with no more than min VCs during morning routine. ?  ?Problem: RH Functional Use of Upper Extremity ?Goal: LTG Patient will use RT/LT upper extremity as a (OT) ?Description: LTG: Patient will use right/left upper extremity as a stabilizer/gross assist/diminished/nondominant/dominant level with assist, with/without cues during functional activity (OT) ?Flowsheets (Taken 03/23/2022 1234) ?LTG: Use of upper extremity in functional activities: RUE as gross assist level ?LTG: Pt will use upper extremity in functional activity with assistance level of: Minimal Assistance - Patient > 75% ?  ?Problem: RH Toilet Transfers ?Goal: LTG Patient will perform toilet transfers w/assist (OT) ?Description: LTG: Patient will perform toilet transfers with assist, with/without cues using equipment (OT) ?Flowsheets (Taken 03/23/2022 1234) ?LTG: Pt will perform toilet transfers with assistance level of: Minimal Assistance - Patient > 75% ?  ?Problem: RH Tub/Shower Transfers ?Goal: LTG Patient will perform tub/shower transfers w/assist (OT) ?Description: LTG: Patient will perform tub/shower  transfers with assist, with/without cues using equipment (OT) ?Flowsheets (Taken 03/23/2022 1234) ?LTG: Pt will perform tub/shower stall transfers  with assistance level of: Minimal Assistance - Patient > 75% ?  ?Problem: RH Attention ?Goal: LTG Patient will demonstrate this level of attention during functional activites (OT) ?Description: LTG:  Patient will demonstrate this level of attention during functional activites  (OT) ?Flowsheets (Taken 03/23/2022 1234) ?Patient will demonstrate this level of attention during functional activites: Focused ?Patient will demonstrate above attention level in the following environment: Controlled ?LTG: Patient will demonstrate this level of attention during functional activites (OT): Supervision ?  ?

## 2022-03-23 NOTE — Progress Notes (Signed)
Inpatient Rehabilitation Center ?Individual Statement of Services ? ?Patient Name:  TAAVI HOOSE  ?Date:  03/23/2022 ? ?Welcome to the [facility].  Our goal is to provide you with an individualized program based on your diagnosis and situation, designed to meet your specific needs.  With this comprehensive rehabilitation program, you will be expected to participate in at least 3 hours of rehabilitation therapies Monday-Friday, with modified therapy programming on the weekends. ? ?Your rehabilitation program will include the following services:  Physical Therapy (PT), Occupational Therapy (OT), Speech Therapy (ST), 24 hour per day rehabilitation nursing, Therapeutic Recreaction (TR), Neuropsychology, Care Coordinator, Rehabilitation Medicine, Nutrition Services, Pharmacy Services, and Other ? ?Weekly team conferences will be held on Wednesdays to discuss your progress.  Your Inpatient Rehabilitation Care Coordinator will talk with you frequently to get your input and to update you on team discussions.  Team conferences with you and your family in attendance may also be held. ? ?Expected length of stay: 14-16 Days  Overall anticipated outcome:  Supervision ? ?Depending on your progress and recovery, your program may change. Your Inpatient Rehabilitation Care Coordinator will coordinate services and will keep you informed of any changes. Your Inpatient Rehabilitation Care Coordinator's name and contact numbers are listed  below. ? ?The following services may also be recommended but are not provided by the [facility]:  ? ?Home Health Rehabiltiation Services ?Outpatient Rehabilitation Services ? ?Arrangements will be made to provide these services after discharge if needed.  Arrangements include referral to agencies that provide these services. ? ?Your insurance has been verified to be:   [place] [employer]ICA[employer]  ?Your primary doctor is:  NO PCP ? ?Pertinent information will be shared with  your doctor and your insurance company. ? ?Inpatient Rehabilitation Care Coordinator:  Erlene Quan, [place] or ([phone] ? ?Information discussed with and copy given to patient by: Dyanne Iha, 03/23/2022, 9:29 AM    ?

## 2022-03-23 NOTE — Progress Notes (Signed)
Patient seen today by trach team for consult.  No education is needed at this time.  All necessary equipment is at beside.   Will continue to follow for progression.  

## 2022-03-23 NOTE — Evaluation (Signed)
Speech Language Pathology Assessment and Plan ? ?Patient Details  ?Name: Todd Estrada ?MRN: [id number] ?Date of Birth: Mar 31, 1961 ? ?SLP Diagnosis: Aphasia;Dysarthria;Cognitive Impairments;Dysphagia;Speech and Language deficits  ?Rehab Potential: Fair ?ELOS: 3.5-4 weeks  ? ?Today's Date: 03/23/2022 ?SLP Individual Time: 6073-7106 ?SLP Individual Time Calculation (min): 45 min and Today's Date: 03/23/2022 ?SLP Missed Time: 15 Minutes ?Missed Time Reason: Patient fatigue ? ?Hospital Problem: Principal Problem: ?  Acute ischemic left MCA stroke ([place]) ? ?Past Medical History:  ?Past Medical History:  ?Diagnosis Date  ? Hypertension   ? ?Past Surgical History:  ?Past Surgical History:  ?Procedure Laterality Date  ? ESOPHAGOGASTRODUODENOSCOPY (EGD) WITH PROPOFOL N/A 03/11/2022  ? Procedure: ESOPHAGOGASTRODUODENOSCOPY (EGD) WITH PROPOFOL;  Surgeon: Georganna Skeans, MD;  Location: [place];  Service: General;  Laterality: N/A;  ? IR ANGIO INTRA EXTRACRAN SEL COM CAROTID INNOMINATE UNI R MOD SED  03/02/2022  ? IR CT HEAD LTD  03/02/2022  ? IR PERCUTANEOUS ART THROMBECTOMY/INFUSION INTRACRANIAL INC DIAG ANGIO  03/02/2022  ? PEG PLACEMENT N/A 03/11/2022  ? Procedure: PERCUTANEOUS ENDOSCOPIC GASTROSTOMY (PEG) PLACEMENT;  Surgeon: Georganna Skeans, MD;  Location: [place];  Service: General;  Laterality: N/A;  ? RADIOLOGY WITH ANESTHESIA N/A 03/02/2022  ? Procedure: IR WITH ANESTHESIA;  Surgeon: Luanne Bras, MD;  Location: [place];  Service: Radiology;  Laterality: N/A;  ? ? ?Assessment / Plan / Recommendation ?Clinical Impression isiaha greenup is a 61 year old male whose roommate witnessed seizure-like activity at home on 03/02/2022. EMS called and transported to [facility] ED. Alert but appeared confused and history significant for previous left MCA stroke on aspirin therapy. Slurred speech and right-sided weakness noted. Code stroke initiated and neurology consulted. Dr. Sarita Haver evaluated and patient not tPa candidate due to  unknown LKW. CTA concerning for occlusion of left ICA and IR consulted. He underwent embolectomy and balloon angioplasty by Dr. Estanislado Pandy on 3/15. Given Brilinta and aspirin and extubated. Agitated post-procedure given Ativan prn. History given for excessive alcohol use and monitored for withdrawal. EEG performed 3/16. Noted to be obtunded and aspirating and CCM consulted with re-intubation and mechanical ventilation with evidence of acute hypoxic respiratory failure with aspiration pneumonia. MRI carried out on 3/17 with findings worrisome for acute left MCA territory infarcts predominately involving the left frontal, parietal and temporal lobes with associated edema and sulcal effacement without midline shift, chronically occluded left vertebral artery occlusion. Extubated again on 3/18 but required re-intubation two hours later. He self extubated the following day on 3/19. Required Precedex for agitation. Traumatic nosebleed secondary to NGT placement; Rhino Rocket placed. Precedex stopped, Rocket removed and Cortrak planned. Re-intubated 3/21 secondary to aspiration and lack of gag response. Percutaneous tracheostomy on 3/22. Could not get Cortrak placed and bedside PEG placement performed on 3/24. Plavix started and aspirin continued. Off mechanical ventilation on 3/26. Tolerating trach collar for > 48 hours. Trach changed to cuffless #6 on 3/30. PMV with supervision. ? ?Patient sleeping soundly on arrival and required max verbal and tactile stimuli to rouse. Pt remained lethargic throughout session with focused attention limited to 1-3 minutes requiring max A multimoda lcues. Pt with size 6 cuffless trach in place. When alertness improved, SLP donned teal PMSV. Vitals remained stable during PMSV placement for 5 minute duration; HR 82, SpO2 98%. Pt was able to redirect subglottic airflow in order to achieve phonation at the word level. Pt more likely to nod his head yes/no with limited spontaneous  verbalizations. Pt with mumbled, unintelligible speech, and low vocal intensity  in response to orientation and biographical questions. Pt was oriented to person only but may be limited due to language deficits. Yes/no responses were also inconsistent. SLP facilitated oral care with total A; ivory patces observed on lingual surface with dry and cracking lips. Oral gel was applied on lips. Pt minimally followed one step directions for oral care. While completing oral care patient's HR elevated to 110 and SpO2 reduced to 72% and returned to normal limits following approximately 2 minute duration. Pt was not symptomatic during this time. Notified RN and MD during conference. Sensors may need repositioned. PMSV doffed secondary to sleepiness; no evidence of air trapping. Pt expectorated secretions at the tracheal level; secretions appeared clear. Evaluation discontinued due to lethargy and minimal functional participation. Pt missed 15 minutes of ST intervention. SLP recommends continuation of NPO status. PMSV may be worn under full supervision from staff and during therapies only. Skilled SLP intervention is recommended for cognitive-communication, speech, language, and swallowing deficits. Anticipate patient will require 24 hour supervision at discharge and would benefit from follow up therapy in home health setting.  ?  ?Skilled Therapeutic Interventions          SLP completed cognitive-linguistic, speech, language evaluations. Please see above.    ?  ?SLP Assessment ? Patient will need skilled Speech Lanaguage Pathology Services during CIR admission  ?  ?Recommendations ? Patient may use Passy-Muir Speech Valve: During all therapies with supervision ?PMSV Supervision: Full ?MD: Please consider changing trach tube to : Cuffless ?SLP Diet Recommendations: NPO;Alternative means - temporary ?Medication Administration: Via alternative means ?Oral Care Recommendations: Oral care before and after PO;Oral care  QID ?Recommendations for Other Services: Therapeutic Recreation consult ?Therapeutic Recreation Interventions: Pet therapy ?Patient destination: Home ?Follow up Recommendations: Home Health SLP;24 hour supervision/assistance ?Equipment Recommended: To be determined  ?  ?SLP Frequency 3 to 5 out of 7 days   ?SLP Duration ? ?SLP Intensity ? ?SLP Treatment/Interventions 3.5-4 weeks ? ?Minumum of 1-2 x/day, 30 to 90 minutes ? ?Cueing hierarchy;Cognitive remediation/compensation;Dysphagia/aspiration precaution training;Environmental controls;Functional tasks;Internal/external aids;Multimodal communication approach;Patient/family education;Speech/Language facilitation;Therapeutic Activities   ? ?Pain ?Pain Assessment ?Pain Scale: Faces ?Faces Pain Scale: No hurt ?PAINAD (Pain Assessment in Advanced Dementia) ?Breathing: normal ?Negative Vocalization: none ?Facial Expression: smiling or inexpressive ?Body Language: relaxed ?Consolability: no need to console ?PAINAD Score: 0 ? ?Prior Functioning ?Type of Home: House ? Lives With: Family;Other (Comment) (Pt to discharge to cousin) ?Available Help at Discharge: Family ? ?SLP Evaluation ?Cognition ?Overall Cognitive Status: No family/caregiver present to determine baseline cognitive functioning ?Arousal/Alertness: Lethargic ?Orientation Level: Oriented to person;Disoriented to place;Disoriented to time;Disoriented to situation ?Year:  (no response; difficult to assess 2/2 speech and language deficits and lethargy) ?Month:  (no response; difficult to assess 2/2 speech and language deficits and lethargy) ?Day of Week:  (no response; difficult to assess 2/2 speech and language deficits and lethargy) ?Attention: Focused ?Focused Attention: Impaired ?Focused Attention Impairment: Verbal basic;Functional basic ?Memory: Impaired (difficulty fully assessing due to global aphasia) ?Awareness: Impaired ?Problem Solving: Impaired ?Safety/Judgment: Impaired (impulsive with mobility)   ?Comprehension ?  ?Expression ?Written Expression ?Dominant Hand: Right (L dominant since CVA in 2022`) ?Oral Motor ?  ? ?Care Tool ?Care Tool Cognition ?Ability to hear (with hearing aid or hearing appliances if normally used Ability to

## 2022-03-23 NOTE — Progress Notes (Signed)
Inpatient Rehabilitation  Patient information reviewed and entered into eRehab system by Winnona Wargo M. Elayne Gruver, M.A., CCC/SLP, PPS Coordinator.  Information including medical coding, functional ability and quality indicators will be reviewed and updated through discharge.    

## 2022-03-23 NOTE — Progress Notes (Signed)
RT called to patient's room due to trach being dislodged. Upon entering the trach was more than 2/3 out of the stoma. The patient vitals are stable and he is not in distress.  Patient was laid flat, trach re-inserted with obturator, good ETCO2 detector color change, bilateral BS, and a strong productive cough.  Trach re-secured with trach ties that were found to be loose.  RNs and NP at bedside. ?

## 2022-03-23 NOTE — Patient Care Conference (Signed)
Inpatient RehabilitationTeam Conference and Plan of Care Update ?Date: 03/23/2022   Time: 10:03 AM  ? ? ?Patient Name: Todd Estrada      ?Medical Record Number: [id number]  ?Date of Birth: March 09, 1961 ?Sex: Male         ?Room/Bed: 4W11C/4W11C-01 ?Payor Info: Payor: [place] Herricks / Plan: [place] [employer] [place] / Product Type: *No Product type* /   ? ?Admit Date/Time:  03/22/2022  2:44 PM ? ?Primary Diagnosis:  Acute ischemic left MCA stroke ([place]) ? ?Hospital Problems: Principal Problem: ?  Acute ischemic left MCA stroke ([place]) ? ? ? ?Expected Discharge Date: Expected Discharge Date:  (evals pending) ? ?Team Members Present: ?Physician leading conference: Dr. Alysia Penna ?Social Worker Present: Erlene Quan, BSW ?Nurse Present: Dorien Chihuahua, RN ?PT Present: Barrie Folk, PT ?OT Present: Providence Lanius, OT ?SLP Present: Sherren Kerns, SLP ?PPS Coordinator present : Gunnar Fusi, SLP ? ?   Current Status/Progress Goal Weekly Team Focus  ?Bowel/Bladder ? ?   Incontinent of bladder; constipation   Continent of bowel and bladder   Toileting, laxatives administered prn  ?Swallow/Nutrition/ Hydration ? ? eval pending         ?ADL's ? ? eval pending  eval pending  eval pending   ?Mobility ? ? Eval Pending  Eval pending  Eval Pending   ?Communication ? ? eval pending         ?Safety/Cognition/ Behavioral Observations ? eval pending         ?Pain ? ?   N/A        ?Skin ? ?   Stage 2 sacrum and ?DTI right heel   Skin healing   Dressing changes to sacrum, pressure relief with foam to right heel  ? ? ?Discharge Planning:  ?    ?Team Discussion: ?Patient with left MCA CVA post embolectomy and ETOH history. He is very lethargic and speech is unintelligible with #6 cuff-less Shiley trach/PMSV. PEG for nutritional means with continuous feeding; discussion of changing to bolus feeds. Patient has global aphasia, left gaze preference with little movement of right UE, and poor initiation.   ?Patient on target to meet rehab goals: ?Currently needs CGA for sitting, mod assist for getting up with the stedy. ? ?*See Care Plan and progress notes for long and short-term goals.  ? ?Revisions to Treatment Plan:  ?MBS 03/22/22; trial nectar with SLP only ?  ?Teaching Needs: ?Nutritional means management, medication management, skin care, toileting, transfers, etc  ?Current Barriers to Discharge: ?Home enviroment access/layout, Incontinence, Lack of/limited family support, Nutritional means, New oxygen, and trach after (multi re-intubation). Goals for discharge set for mod - max assist.  ? ?Possible Resolutions to Barriers: ?Family education ?[facility] services ?  ? ? Medical Summary ?Current Status: Global aphasia, NPO on PEG, RUE flaccid ? Barriers to Discharge: Medical stability;Nutrition means;Trach;Wound care ?  ?Possible Resolutions to [employer]: Convert to bolus feeds, PMV trials , D/C IV , complete initial evals ? ? ?Continued Need for Acute Rehabilitation Level of Care: The patient requires daily medical management by a physician with specialized training in physical medicine and rehabilitation for the following reasons: ?Direction of a multidisciplinary physical rehabilitation program to maximize functional independence : Yes ?Medical management of patient stability for increased activity during participation in an intensive rehabilitation regime.: Yes ?Analysis of laboratory values and/or radiology reports with any subsequent need for medication adjustment and/or medical intervention. : Yes ? ? ?I attest that I was present,  lead the team conference, and concur with the assessment and plan of the team. ? ? ?Dorien Chihuahua B ?03/23/2022, 2:14 PM  ? ? ? ? ? ? ?

## 2022-03-23 NOTE — Progress Notes (Signed)
Lower extremity venous RT study completed. ? ?Preliminary results relayed to Kirsteins, MD. ? ?See CV Proc for preliminary results report.  ? ?Darlin Coco, RDMS, RVT ? ?

## 2022-03-23 NOTE — Progress Notes (Signed)
?                                                       PROGRESS NOTE ? ? ?Subjective/Complaints: ? ?Globally aphasic  ? ?Objective: ?  ?DG Swallowing Func-Speech Pathology ? ?Result Date: 03/22/2022 ?Table formatting from the original result was not included. Images from the original result were not included. Objective Swallowing Evaluation: Type of Study: MBS-Modified Barium Swallow Study  Completed by Vaughan Sine, SLP Student Supervised and reviewed by Herbie Baltimore, MA CCC-SLP Acute Rehabilitation Services Secure Chat Preferred Office [phone] Patient Details Name: Todd Estrada MRN: [id number] Date of Birth: 01/26/1961 Today's Date: 03/22/2022 Time: SLP Start Time (ACUTE ONLY): 0945 -SLP Stop Time (ACUTE ONLY): 1008 SLP Time Calculation (min) (ACUTE ONLY): 23 min Past Medical History: Past Medical History: Diagnosis Date  Hypertension  Past Surgical History: Past Surgical History: Procedure Laterality Date  ESOPHAGOGASTRODUODENOSCOPY (EGD) WITH PROPOFOL N/A 03/11/2022  Procedure: ESOPHAGOGASTRODUODENOSCOPY (EGD) WITH PROPOFOL;  Surgeon: Georganna Skeans, MD;  Location: [place];  Service: General;  Laterality: N/A;  IR ANGIO INTRA EXTRACRAN SEL COM CAROTID INNOMINATE UNI R MOD SED  03/02/2022  IR CT HEAD LTD  03/02/2022  IR PERCUTANEOUS ART THROMBECTOMY/INFUSION INTRACRANIAL INC DIAG ANGIO  03/02/2022  PEG PLACEMENT N/A 03/11/2022  Procedure: PERCUTANEOUS ENDOSCOPIC GASTROSTOMY (PEG) PLACEMENT;  Surgeon: Georganna Skeans, MD;  Location: [place];  Service: General;  Laterality: N/A;  RADIOLOGY WITH ANESTHESIA N/A 03/02/2022  Procedure: IR WITH ANESTHESIA;  Surgeon: Luanne Bras, MD;  Location: [place];  Service: Radiology;  Laterality: N/A; HPI: 61 y/o male with a history of Left MCA stroke in 3546 with a complicated post stroke course presented on 3/15 with a 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  He was extubated following procedure, but reintubated due to respiratory  failure.  Self extubated overnight 3/19, now on venturi mask.  Re-intubated on 3/21 and bronchoscopy and tracheostomy performed on 3/22. Prior CVA resulted in mild to moderate aphasia.  Subjective: Pt resting in bed, awake. Sonorous breathing sounds and wet vocal quality noted. No family present  Recommendations for follow up therapy are one component of a multi-disciplinary discharge planning process, led by the attending physician.  Recommendations may be updated based on patient status, additional functional criteria and insurance authorization. Assessment / Plan / Recommendation   03/22/2022  10:00 AM Clinical Impressions Clinical Impression Pt was seen for MBS. Teal PMSV in place throughout the duration of today's study. Pt had significant lethargy which may have impacted results yielded from [facility]. MBS revealed that the pt has a moderate oropharyngeal dysphagia. Risk of aspiration is mildly increased d/t signficant lethargy. Oral phase of swallowing impaired as evidenced by delayed oral transit and propulsion of boluses, lingual/palatal residue, premature spillage and oral holding. Pt required moderate verbal and tactile cues in order to resolve oral holding behaviors across conssitencies and textures. Superior & anterior movement of the hyoid appeared WNL with adequate epiglottic deflection. Pt penetrated and aspirated with thin liquids. Pt was able to sense penetrates/aspirates and eject them with a cough. Aspiration and penetration of thin liquids is the result of residuals pooling in the valleculae, pyriform sinuses and posterior pharynx. Residuals spill over into laryngeal vestibule after the pharyngeal swallow. Pt tolerated cup sips of NTL with instances of flash penetration (PAS 2).  Tolerated applesauce with no signs of penetration or aspriation observed. Per patient's decreased arousal/alertness to PO trials, SLP recommends continuing NPO diet. As patient's level of arousal and attention to POs improves,  recommend initiating more conservative diet of Dysphagia 1 with nectar thick liquids under full supervision of the SLP. Pt demonstrates good prognosis for diet advancement with consistent levels of alertness and arousal. SLP Visit Diagnosis Dysphagia, oropharyngeal phase (R13.12) Impact on safety and function Moderate aspiration risk     03/22/2022  10:00 AM Treatment Recommendations Treatment Recommendations Therapy as outlined in treatment plan below     03/22/2022  10:00 AM Prognosis Prognosis for Safe Diet Advancement Good Barriers to Reach Goals Language deficits;Severity of deficits   03/22/2022  10:00 AM Diet Recommendations SLP Diet Recommendations NPO Liquid Administration via Straw Medication Administration Crushed with puree Compensations Minimize environmental distractions;Slow rate;Small sips/bites Postural Changes Seated upright at 90 degrees     03/22/2022  10:00 AM Other Recommendations Oral Care Recommendations Oral care BID Other Recommendations Have oral suction available Follow Up Recommendations Acute inpatient rehab (3hours/day) Assistance recommended at discharge Intermittent Supervision/Assistance Functional Status Assessment Patient has had a recent decline in their functional status and demonstrates the ability to make significant improvements in function in a reasonable and predictable amount of time.   03/22/2022  10:00 AM Frequency and Duration  Speech Therapy Frequency (ACUTE ONLY) min 2x/week Treatment Duration 2 weeks     03/22/2022  10:00 AM Oral Phase Oral Phase Impaired Oral - Nectar Teaspoon NT Oral - Nectar Cup Weak lingual manipulation;Holding of bolus;Lingual/palatal residue Oral - Nectar Straw NT Oral - Thin Teaspoon Weak lingual manipulation;Incomplete tongue to palate contact;Reduced posterior propulsion;Holding of bolus;Lingual/palatal residue;Delayed oral transit;Premature spillage Oral - Thin Cup Weak lingual manipulation;Incomplete tongue to palate contact;Reduced posterior  propulsion;Holding of bolus;Lingual/palatal residue;Delayed oral transit;Premature spillage Oral - Thin Straw NT Oral - Puree Holding of bolus;Lingual/palatal residue;Lingual pumping Oral - Mech Soft NT Oral - Regular NT Oral - Multi-Consistency NT Oral - Pill NT    03/22/2022  10:00 AM Pharyngeal Phase Pharyngeal Phase Impaired Pharyngeal- Nectar Teaspoon NT Pharyngeal- Nectar Cup Delayed swallow initiation-vallecula;Pharyngeal residue - valleculae;Pharyngeal residue - pyriform;Pharyngeal residue - posterior pharnyx;Penetration/Aspiration before swallow;Penetration/Aspiration during swallow Pharyngeal Material enters airway, remains ABOVE vocal cords then ejected out Pharyngeal- Nectar Straw NT Pharyngeal- Thin Teaspoon Delayed swallow initiation-vallecula;Pharyngeal residue - valleculae;Pharyngeal residue - pyriform;Pharyngeal residue - posterior pharnyx;Penetration/Aspiration before swallow;Penetration/Aspiration during swallow;Trace aspiration Pharyngeal- Thin Cup Delayed swallow initiation-vallecula;Pharyngeal residue - valleculae;Pharyngeal residue - pyriform;Pharyngeal residue - posterior pharnyx;Moderate aspiration;Penetration/Apiration after swallow Pharyngeal- Thin Straw NT Pharyngeal- Puree WFL Pharyngeal Material does not enter airway Pharyngeal- Mechanical Soft NT Pharyngeal- Regular NT Pharyngeal- Multi-consistency NT Pharyngeal- Pill NT     View : No data to display.    DeBlois, Katherene Ponto 03/22/2022, 11:38 AM                     ?Recent Labs  ?  03/23/22 ?2330  ?WBC 14.9*  ?HGB 13.9  ?HCT 42.6  ?PLT 410*  ? ?Recent Labs  ?  03/23/22 ?0762  ?NA 135  ?K 4.2  ?CL 102  ?CO2 25  ?GLUCOSE 130*  ?BUN 24*  ?CREATININE 0.76  ?CALCIUM 8.6*  ? ? ?Intake/Output Summary (Last 24 hours) at 03/23/2022 0909 ?Last data filed at 03/23/2022 0636 ?Gross per 24 hour  ?Intake 200 ml  ?Output 150 ml  ?Net 50 ml  ?  ? ?Pressure Injury 03/22/22 [place] Right;Lateral Deep Tissue Pressure Injury -  Purple or maroon localized area of  discolored intact skin or blood-filled blister due to damage of underlying soft tissue from pressure and/or shear. (Active)  ?03/22/22 1500  ?Location: [place]  ?Location Orientation: Right;Lateral  ?Staging: Deep Tis

## 2022-03-23 NOTE — Progress Notes (Signed)
? ?NAME:  Todd Estrada, MRN:  [id number], DOB:  [birth year], LOS: 1 ?ADMISSION DATE:  03/22/2022, CONSULTATION DATE:  3/16 ?REFERRING MD:  Erlinda Hong, CHIEF COMPLAINT:  Confusion, seizure  ? ?History of Present Illness:  ?61 y/o male with a history of Left MCA stroke in 3536 with a complicated post stroke course presented on 3/15 with a 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  Intubated on 3/16 for airway protection.  Transferred to rehab for 03/2022 ? ?Pertinent  Medical History  ?Hyeprtension ?Left MCA stroke ?Chronic combined CHF ? ?Significant Hospital Events: ?Including procedures, antibiotic start and stop dates in addition to other pertinent events   ?3/15 admitted for acute L MCA stroke due to L ICA occlusion with first time seizure s/p revascularization of L ICA occlusion ?3/16 Intubated for airway protection; started unasyn; echo LVEF 40-45%, RVSP normal ?3/17 MRI Brain showed acute infarct in left frontal, temporal and parietal lobes with findings worrisome for acute infarct, left vert occluded; CT head did not show hemorrhage; respiratory notes thick secretions,  ?3/18 following some commands on exam, extubated the re-intubated due to hypoxemia, inability to clear secretions ?3/19 self extubated overnight, remains on NRB ?3/21 required reintubation for inability to protect airway and aspiration  ?3/22 bedside perc tracheostomy with bronchoscopy  ?3/24 PEG ?3/26 started on trach collar ?03/17/2022 pulmonary critical care called to bedside for patient who has decannulate himself x2.  He is now restrained and most likely needs a sitter ?03/22/2022 transferred to rehab ?03/23/2022 pulmonary critical care consulted ?Interim History / Subjective:  ?His tracheostomy out during nighttime hours. ? ?Objective   ?Blood pressure 113/72, pulse 95, temperature 98 ?F (36.7 ?C), resp. rate 18, weight 77 kg, SpO2 99 %. ?   ?FiO2 (%):  [21 %-28 %] 21 %  ? ?Intake/Output Summary (Last 24 hours) at 03/23/2022  1232 ?Last data filed at 03/23/2022 0636 ?Gross per 24 hour  ?Intake 200 ml  ?Output 150 ml  ?Net 50 ml  ? ?Filed Weights  ? 03/22/22 1624  ?Weight: 77 kg  ? ? ?General: Peers much older than stated age of 37 disheveled. ?HEENT: MM pink/moist #6 uncuffed tracheostomy is in place with scant secretions at this time ?Neuro: Right-sided weakness but able to follow commands with both arms and legs and speech is understandable slight being trached ?CV: Heart sounds are regular ?PULM: Diminished in the bases ? ? ?GI: soft, bsx4 active  ?GU: Amber urine ?Extremities: warm/dry, negative edema  ?Skin: no rashes or lesions ? ? ? ?Resolved Hospital Problem list   ?Septic shock due to aspiration pneumonia> resolved ?Constipation ? ?Assessment & Plan:  ?Acute encephalopathy due to left MCA stroke from left ICA occlusion requiring thrombectomy and stent placement on 3/15 ?Seizure disorder ?Agitated delirium on 3/19> risk to self pulling at tubes, lines ?Per neuro ?Currently more awake interactive and cooperative ? ?Acute hypoxemic respiratory failure due to aspiration pneumonia s/p tracheostomy > improving ?Thick pulmonary secretions ?Concern for emphysema/COPD > not in history but exam supports ?Patient required intubation on 03/05/2021 by pulmonary critical care ?Subsequent tracheostomy 03/09/2022.  CCM following the tracheostomy and while he was in the progressive care unit.  He had copious thick secretions that made decannulation not an option.  He was also near obtunded and could not protect his airway at this time.  Now his secretions have decreased he is more awake follows commands and although he has right-sided weakness he can follow commands with right arm left arm  and left leg and right leg.  Therefore is reasonable to try him with a Passy-Muir valve being extra careful due to his heavy secretions.  I would not downsize his trach at this time until his secretions have decreased. ? ?Continue to use limited restraints ?Okay  to attempt Passy-Muir valve ?We had started on low-dose Robinul prior to transfer to rehab and remains on 1 mg 2 times a day would continue Robinul at this time ?Monitor for questionable decannulation in near future since his neuro status has improved remarkably ?Oxygen as needed ?Bronco dilators as needed ?Nicotine patch is discontinued to give him a chance to be free of nicotine addiction ?Pulmonary critical care will see as needed while in rehab ?Chest x-ray for completeness ? ? ? ? ? ? ?Combined systolic and diastolic heart failure, chronic ?Hypertension ?Per primary ? ?RUE thrombophlebitis ?Per primary ? ?Best Practice (right click and "Reselect all SmartList Selections" daily)  ?Per primary ?Diet/type: tubefeeds ?DVT prophylaxis: LMWH ?GI prophylaxis: PPI ?Lines: N/A ?Foley:  N/A ?Code Status:  full code ?Last date of multidisciplinary goals of care discussion [per stroke service] ? ?Labs   ?CBC: ?Recent Labs  ?Lab 03/17/22 ?0412 03/18/22 ?5784 03/23/22 ?6962  ?WBC 12.5* 14.0* 14.9*  ?NEUTROABS 9.6* 11.1* 11.6*  ?HGB 15.0 15.1 13.9  ?HCT 46.1 45.9 42.6  ?MCV 91.8 90.9 92.2  ?PLT 401* 474* 410*  ? ? ?Basic Metabolic Panel: ?Recent Labs  ?Lab 03/17/22 ?0412 03/18/22 ?9528 03/19/22 ?0226 03/23/22 ?4132  ?NA 140 136 136 135  ?K 4.4 4.5 4.4 4.2  ?CL 101 100 101 102  ?CO2 31 28 28 25   ?GLUCOSE 117* 134* 146* 130*  ?BUN 19 26* 27* 24*  ?CREATININE 0.71 0.81 0.67 0.76  ?CALCIUM 9.0 8.8* 8.7* 8.6*  ? ?GFR: ?Estimated Creatinine Clearance: 105.6 mL/min (by C-G formula based on SCr of 0.76 mg/dL). ?Recent Labs  ?Lab 03/17/22 ?0412 03/18/22 ?4401 03/23/22 ?0272  ?WBC 12.5* 14.0* 14.9*  ? ? ?Liver Function Tests: ?Recent Labs  ?Lab 03/17/22 ?0412 03/18/22 ?5366 03/23/22 ?4403  ?AST 48* 51* 40  ?ALT 48* 60* 61*  ?ALKPHOS 96 105 93  ?BILITOT 0.6 1.0 0.3  ?PROT 6.7 6.9 6.6  ?ALBUMIN 2.6* 2.6* 2.5*  ? ?No results for input(s): LIPASE, AMYLASE in the last 168 hours. ?No results for input(s): AMMONIA in the last 168  hours. ? ?ABG ?   ?Component Value Date/Time  ? PHART 7.378 03/08/2022 1732  ? PCO2ART 55.9 (H) 03/08/2022 1732  ? PO2ART 98 03/08/2022 1732  ? HCO3 32.8 (H) 03/08/2022 1732  ? TCO2 34 (H) 03/08/2022 1732  ? O2SAT 97 03/08/2022 1732  ?  ? ?Coagulation Profile: ?No results for input(s): INR, PROTIME in the last 168 hours. ? ?Cardiac Enzymes: ?No results for input(s): CKTOTAL, CKMB, CKMBINDEX, TROPONINI in the last 168 hours. ? ?HbA1C: ?Hgb A1c MFr Bld  ?Date/Time Value Ref Range Status  ?03/03/2022 04:30 AM 5.4 4.8 - 5.6 % Final  ?  Comment:  ?  (NOTE) ?        Prediabetes: 5.7 - 6.4 ?        Diabetes: >6.4 ?        Glycemic control for adults with diabetes: <7.0 ?  ?08/19/2021 07:49 AM 5.5 4.8 - 5.6 % Final  ?  Comment:  ?  (NOTE) ?Pre diabetes:          5.7%-6.4% ? ?Diabetes:              >6.4% ? ?  Glycemic control for   <7.0% ?adults with diabetes ?  ? ? ?CBG: ?Recent Labs  ?Lab 03/22/22 ?1638 03/22/22 ?2006 03/23/22 ?2811 03/23/22 ?8867 03/23/22 ?1205  ?GLUCAP 153* 133* 124* 130* 146*  ? ?Critical care time: na  ?  ? ?Richardson Landry Jovie Swanner ACNP ?Acute Care Nurse Practitioner ?[place] ?Please consult Amion ?03/23/2022, 12:32 PM ? ? ? ? ? ? ?

## 2022-03-24 ENCOUNTER — Encounter (HOSPITAL_COMMUNITY): Payer: Medicaid Other

## 2022-03-24 DIAGNOSIS — I739 Peripheral vascular disease, unspecified: Secondary | ICD-10-CM

## 2022-03-24 DIAGNOSIS — I63512 Cerebral infarction due to unspecified occlusion or stenosis of left middle cerebral artery: Secondary | ICD-10-CM | POA: Diagnosis not present

## 2022-03-24 LAB — GLUCOSE, CAPILLARY
Glucose-Capillary: 105 mg/dL — ABNORMAL HIGH (ref 70–99)
Glucose-Capillary: 105 mg/dL — ABNORMAL HIGH (ref 70–99)

## 2022-03-24 NOTE — Progress Notes (Signed)
?                                                       PROGRESS NOTE ? ? ?Subjective/Complaints: ? ?Globally aphasic , nursing in room, reviewed vascular study LE ?ROS- unable due to aphasia  ?Objective: ?  ?DG CHEST PORT 1 VIEW ? 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 Hyperinflation with background emphysema. Left upper lobe perihilar mass and right mid and lower lung nodules again noted better demonstrated by comparison chest CT. No enlarging effusion or pneumothorax. No new superimposed acute airspace process or pneumonia. Trachea midline.  Aorta atherosclerotic.  No acute osseous finding. IMPRESSION: Stable hyperinflation compatible with COPD/emphysema. Similar left perihilar mass and right lung nodules, again concerning for malignancy. Please refer to the recent chest CT. No superimposed acute chest process by plain radiography. Electronically Signed   By: Jerilynn Mages.  Shick M.D.   On: 03/23/2022 13:21  ? ?DG Swallowing Func-Speech Pathology ? ?Result Date: 03/22/2022 ?Table formatting from the original result was not included. Images from the original result were not included. Objective Swallowing Evaluation: Type of Study: MBS-Modified Barium Swallow Study  Completed by Vaughan Sine, SLP Student Supervised and reviewed by Herbie Baltimore, MA CCC-SLP Acute Rehabilitation Services Secure Chat Preferred Office [phone] Patient Details Name: Todd Estrada MRN: [id number] Date of Birth: 02-27-1961 Today's Date: 03/22/2022 Time: SLP Start Time (ACUTE ONLY): 0945 -SLP Stop Time (ACUTE ONLY): 1008 SLP Time Calculation (min) (ACUTE ONLY): 23 min Past Medical History: Past Medical History: Diagnosis Date  Hypertension  Past Surgical History: Past Surgical History: Procedure Laterality Date  ESOPHAGOGASTRODUODENOSCOPY (EGD) WITH PROPOFOL N/A  03/11/2022  Procedure: ESOPHAGOGASTRODUODENOSCOPY (EGD) WITH PROPOFOL;  Surgeon: Georganna Skeans, MD;  Location: [facility];  Service: General;  Laterality: N/A;  IR ANGIO INTRA EXTRACRAN SEL COM CAROTID INNOMINATE UNI R MOD SED  03/02/2022  IR CT HEAD LTD  03/02/2022  IR PERCUTANEOUS ART THROMBECTOMY/INFUSION INTRACRANIAL INC DIAG ANGIO  03/02/2022  PEG PLACEMENT N/A 03/11/2022  Procedure: PERCUTANEOUS ENDOSCOPIC GASTROSTOMY (PEG) PLACEMENT;  Surgeon: Georganna Skeans, MD;  Location: [facility];  Service: General;  Laterality: N/A;  RADIOLOGY WITH ANESTHESIA N/A 03/02/2022  Procedure: IR WITH ANESTHESIA;  Surgeon: Luanne Bras, MD;  Location: [facility];  Service: Radiology;  Laterality: N/A; HPI: 61 y/o male with a history of Left MCA stroke in 2119 with a complicated post stroke course presented on 3/15 with a 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  He was extubated following procedure, but reintubated due to respiratory failure.  Self extubated overnight 3/19, now on venturi mask.  Re-intubated on 3/21 and bronchoscopy and tracheostomy performed on 3/22. Prior CVA resulted in mild to moderate aphasia.  Subjective: Pt resting in bed, awake. Sonorous breathing sounds and wet vocal quality noted. No family present  Recommendations for follow up therapy are one component of a multi-disciplinary discharge planning process, led by the attending physician.  Recommendations may be updated based on patient status, additional functional criteria and insurance authorization. Assessment / Plan / Recommendation   03/22/2022  10:00 AM Clinical Impressions Clinical Impression Pt was seen for MBS. Teal PMSV in place throughout the duration of today's study. Pt  had significant lethargy which may have impacted results yielded from [facility]. MBS revealed that the pt has a moderate oropharyngeal dysphagia. Risk of aspiration is mildly increased d/t signficant lethargy. Oral phase of swallowing impaired as  evidenced by delayed oral transit and propulsion of boluses, lingual/palatal residue, premature spillage and oral holding. Pt required moderate verbal and tactile cues in order to resolve oral holding behaviors across conssitencies and textures. Superior & anterior movement of the hyoid appeared WNL with adequate epiglottic deflection. Pt penetrated and aspirated with thin liquids. Pt was able to sense penetrates/aspirates and eject them with a cough. Aspiration and penetration of thin liquids is the result of residuals pooling in the valleculae, pyriform sinuses and posterior pharynx. Residuals spill over into laryngeal vestibule after the pharyngeal swallow. Pt tolerated cup sips of NTL with instances of flash penetration (PAS 2). Tolerated applesauce with no signs of penetration or aspriation observed. Per patient's decreased arousal/alertness to PO trials, SLP recommends continuing NPO diet. As patient's level of arousal and attention to POs improves, recommend initiating more conservative diet of Dysphagia 1 with nectar thick liquids under full supervision of the SLP. Pt demonstrates good prognosis for diet advancement with consistent levels of alertness and arousal. SLP Visit Diagnosis Dysphagia, oropharyngeal phase (R13.12) Impact on safety and function Moderate aspiration risk     03/22/2022  10:00 AM Treatment Recommendations Treatment Recommendations Therapy as outlined in treatment plan below     03/22/2022  10:00 AM Prognosis Prognosis for Safe Diet Advancement Good Barriers to Reach Goals Language deficits;Severity of deficits   03/22/2022  10:00 AM Diet Recommendations SLP Diet Recommendations NPO Liquid Administration via Straw Medication Administration Crushed with puree Compensations Minimize environmental distractions;Slow rate;Small sips/bites Postural Changes Seated upright at 90 degrees     03/22/2022  10:00 AM Other Recommendations Oral Care Recommendations Oral care BID Other Recommendations Have oral  suction available Follow Up Recommendations Acute inpatient rehab (3hours/day) Assistance recommended at discharge Intermittent Supervision/Assistance Functional Status Assessment Patient has had a recent decline in their functional status and demonstrates the ability to make significant improvements in function in a reasonable and predictable amount of time.   03/22/2022  10:00 AM Frequency and Duration  Speech Therapy Frequency (ACUTE ONLY) min 2x/week Treatment Duration 2 weeks     03/22/2022  10:00 AM Oral Phase Oral Phase Impaired Oral - Nectar Teaspoon NT Oral - Nectar Cup Weak lingual manipulation;Holding of bolus;Lingual/palatal residue Oral - Nectar Straw NT Oral - Thin Teaspoon Weak lingual manipulation;Incomplete tongue to palate contact;Reduced posterior propulsion;Holding of bolus;Lingual/palatal residue;Delayed oral transit;Premature spillage Oral - Thin Cup Weak lingual manipulation;Incomplete tongue to palate contact;Reduced posterior propulsion;Holding of bolus;Lingual/palatal residue;Delayed oral transit;Premature spillage Oral - Thin Straw NT Oral - Puree Holding of bolus;Lingual/palatal residue;Lingual pumping Oral - Mech Soft NT Oral - Regular NT Oral - Multi-Consistency NT Oral - Pill NT    03/22/2022  10:00 AM Pharyngeal Phase Pharyngeal Phase Impaired Pharyngeal- Nectar Teaspoon NT Pharyngeal- Nectar Cup Delayed swallow initiation-vallecula;Pharyngeal residue - valleculae;Pharyngeal residue - pyriform;Pharyngeal residue - posterior pharnyx;Penetration/Aspiration before swallow;Penetration/Aspiration during swallow Pharyngeal Material enters airway, remains ABOVE vocal cords then ejected out Pharyngeal- Nectar Straw NT Pharyngeal- Thin Teaspoon Delayed swallow initiation-vallecula;Pharyngeal residue - valleculae;Pharyngeal residue - pyriform;Pharyngeal residue - posterior pharnyx;Penetration/Aspiration before swallow;Penetration/Aspiration during swallow;Trace aspiration Pharyngeal- Thin Cup Delayed  swallow initiation-vallecula;Pharyngeal residue - valleculae;Pharyngeal residue - pyriform;Pharyngeal residue - posterior pharnyx;Moderate aspiration;Penetration/Apiration after swallow Pharyngeal- Thin Straw NT

## 2022-03-24 NOTE — Consult Note (Signed)
?Hospital Consult ? ? ?Reason for Consult: Bilateral lower extremity foot wounds ?Requesting Physician:  Kirsteins ?MRN #:  [id number] ? ?History of Present Illness: This is a 61 y.o. male currently in inpatient rehab status post 03/02/2022 left-sided MCA stroke.  CT demonstrated left ICA occlusion, the patient underwent embolectomy, balloon angioplasty.  Currently on Brilinta Brilinta and aspirin.  The stroke left dense deficits.  He is now status post tracheostomy, PEG tube placement. ? ?Vascular surgery was called after left lower extremity duplex ultrasound demonstrating occlusion of the superficial femoral artery. ? ?On exam, Norval was able to participate with yes or no questions.  I am unsure as to his capacity for understanding. ? ?Past Medical History:  ?Diagnosis Date  ? Hypertension   ? ? ?Past Surgical History:  ?Procedure Laterality Date  ? ESOPHAGOGASTRODUODENOSCOPY (EGD) WITH PROPOFOL N/A 03/11/2022  ? Procedure: ESOPHAGOGASTRODUODENOSCOPY (EGD) WITH PROPOFOL;  Surgeon: Georganna Skeans, MD;  Location: [facility];  Service: General;  Laterality: N/A;  ? IR ANGIO INTRA EXTRACRAN SEL COM CAROTID INNOMINATE UNI R MOD SED  03/02/2022  ? IR CT HEAD LTD  03/02/2022  ? IR PERCUTANEOUS ART THROMBECTOMY/INFUSION INTRACRANIAL INC DIAG ANGIO  03/02/2022  ? PEG PLACEMENT N/A 03/11/2022  ? Procedure: PERCUTANEOUS ENDOSCOPIC GASTROSTOMY (PEG) PLACEMENT;  Surgeon: Georganna Skeans, MD;  Location: [facility];  Service: General;  Laterality: N/A;  ? RADIOLOGY WITH ANESTHESIA N/A 03/02/2022  ? Procedure: IR WITH ANESTHESIA;  Surgeon: Luanne Bras, MD;  Location: [facility];  Service: Radiology;  Laterality: N/A;  ? ? ?No Known Allergies ? ?Prior to Admission medications   ?Medication Sig Start Date End Date Taking? Authorizing Provider  ?arformoterol (BROVANA) 15 MCG/2ML NEBU Take 2 mLs (15 mcg total) by nebulization 2 (two) times daily. 03/21/22   Nita Sells, MD  ?aspirin EC 81 MG EC tablet Take 1 tablet (81 mg  total) by mouth daily. Swallow whole. ?Patient not taking: Reported on 03/07/2022 08/21/21   Kathie Dike, MD  ?atorvastatin (LIPITOR) 80 MG tablet Take 1 tablet (80 mg total) by mouth daily. ?Patient not taking: Reported on 03/07/2022 08/21/21   Kathie Dike, MD  ?budesonide (PULMICORT) 0.5 MG/2ML nebulizer solution Take 2 mLs (0.5 mg total) by nebulization 2 (two) times daily. 03/21/22   Nita Sells, MD  ?clopidogrel (PLAVIX) 75 MG tablet Place 1 tablet (75 mg total) into feeding tube daily. 03/22/22   Nita Sells, MD  ?folic acid (FOLVITE) 1 MG tablet Place 1 tablet (1 mg total) into feeding tube daily. 03/22/22   Nita Sells, MD  ?glycopyrrolate (ROBINUL) 1 MG tablet Take 1 tablet (1 mg total) by mouth 2 (two) times daily. 03/21/22   Nita Sells, MD  ?levETIRAcetam (KEPPRA) 100 MG/ML solution Place 5 mLs (500 mg total) into feeding tube 2 (two) times daily. 03/21/22   Nita Sells, MD  ?pantoprazole sodium (PROTONIX) 40 mg Place 40 mg into feeding tube daily. 03/21/22   Nita Sells, MD  ?polyethylene glycol (MIRALAX / GLYCOLAX) 17 g packet Place 17 g into feeding tube daily as needed for moderate constipation. 03/21/22   Nita Sells, MD  ?QUEtiapine (SEROQUEL) 25 MG tablet Take 1 tablet (25 mg total) by mouth at bedtime. 03/21/22   Nita Sells, MD  ?revefenacin (YUPELRI) 175 MCG/3ML nebulizer solution Take 3 mLs (175 mcg total) by nebulization daily. 03/22/22   Nita Sells, MD  ?thiamine 100 MG tablet Place 1 tablet (100 mg total) into feeding tube daily. 03/22/22   Nita Sells, MD  ?Water For Irrigation, Sterile (  FREE WATER) SOLN Place 200 mLs into feeding tube every 6 (six) hours. 03/21/22   Nita Sells, MD  ? ? ?Social History  ? ?Socioeconomic History  ? Marital status: Married  ?  Spouse name: Not on file  ? Number of children: Not on file  ? Years of education: Not on file  ? Highest education level: Not on file  ?Occupational  History  ? Not on file  ?Tobacco Use  ? Smoking status: Every Day  ? Smokeless tobacco: Never  ?Vaping Use  ? Vaping Use: Never used  ?Substance and Sexual Activity  ? Alcohol use: Yes  ? Drug use: No  ? Sexual activity: Not on file  ?Other Topics Concern  ? Not on file  ?Social History Narrative  ? Not on file  ? ?Social Determinants of Health  ? ?Financial Resource Strain: Not on file  ?Food Insecurity: Not on file  ?Transportation Needs: Not on file  ?Physical Activity: Not on file  ?Stress: Not on file  ?Social Connections: Not on file  ?Intimate Partner Violence: Not on file  ? ?History reviewed. No pertinent family history. ? ?ROS: Otherwise negative unless mentioned in HPI ? ?Physical Examination ? ?Vitals:  ? 03/24/22 1436 03/24/22 1533  ?BP: 106/66   ?Pulse: 91 89  ?Resp: 18 18  ?Temp: 98.6 ?F (37 ?C)   ?SpO2: 95% 98%  ? ?Body mass index is 23.02 kg/m?. ? ?General:  WDWN in NAD ?Gait: Not observed ?HENT: Tracheostomy ?Pulmonary: Trach collar ?Cardiac: regular ?Abdomen: soft, NT/ND, no masses ?Skin: without rashes ?Vascular Exam/Pulses: Palpable right femoral pulse, nonpalpable left femoral pulse, nonpalpable pulses in the feet ?Extremities: with ischemic changes, without Gangrene , without cellulitis; with open wounds;  ?Bilateral feet demonstrate small eschar appreciated on the second toes.  These are noninfected ?The foot is viable, warm ?Musculoskeletal: no muscle wasting or atrophy  ?Neurologic: A&O X 3;  No focal weakness or paresthesias are detected; speech is fluent/normal ?Psychiatric:  The pt has Normal affect. ?Lymph:  Unremarkable ? ?CBC ?   ?Component Value Date/Time  ? WBC 14.9 (H) 03/23/2022 0516  ? RBC 4.62 03/23/2022 0516  ? HGB 13.9 03/23/2022 0516  ? HCT 42.6 03/23/2022 0516  ? PLT 410 (H) 03/23/2022 0516  ? MCV 92.2 03/23/2022 0516  ? MCH 30.1 03/23/2022 0516  ? MCHC 32.6 03/23/2022 0516  ? RDW 13.2 03/23/2022 0516  ? LYMPHSABS 1.8 03/23/2022 0516  ? MONOABS 0.9 03/23/2022 0516  ? EOSABS  0.5 03/23/2022 0516  ? BASOSABS 0.1 03/23/2022 0516  ? ? ?BMET ?   ?Component Value Date/Time  ? NA 135 03/23/2022 0516  ? K 4.2 03/23/2022 0516  ? CL 102 03/23/2022 0516  ? CO2 25 03/23/2022 0516  ? GLUCOSE 130 (H) 03/23/2022 0516  ? BUN 24 (H) 03/23/2022 0516  ? CREATININE 0.76 03/23/2022 0516  ? CALCIUM 8.6 (L) 03/23/2022 0516  ? GFRNONAA >60 03/23/2022 0516  ? ? ?COAGS: ?Lab Results  ?Component Value Date  ? INR 1.0 08/18/2021  ? ? ? ?Non-Invasive Vascular Imaging:   ?Pending ? ? ?ASSESSMENT/PLAN: This is a 61 y.o. male currently in inpatient rehab status post stroke which has left dense deficits.  Patient currently with tracheostomy, and PEG tube.  He is working with physical therapy.  Currently nonambulatory. ? ?Bilateral lower extremities warm and well-perfused. No signs of acute limb ischemia.  The superficial femoral artery occlusion appears chronic.  He has a nonpalpable pulse in his left groin, which  is also a sign of significant peripheral arterial disease affecting his iliofemoral system.  This does not appear to be acute. ABI ordered. ? ?Saxton would benefit from outpatient follow-up with our office.  He is currently not a limb salvage candidate due to his nonambulatory status.  I am hoping he will have significant improvement in acute rehab but we can discuss his severity of symptoms, as well as possible treatment options. ? ?Recommend keeping the feet dry, heel offloading. ? ?We will follow-up his ABI, which is ordered, and we will schedule outpatient follow-up accordingly. ? ?Recommend the following which can slow the progression of atherosclerosis and reduce the risk of major adverse cardiac / limb events:  ?Aspirin 81mg  PO QD.  ?Atorvastatin 40-80mg  PO QD (or other "high intensity" statin therapy). ?Complete cessation from all tobacco products. ?Blood glucose control with goal A1c < 7%. ?Blood pressure control with goal blood pressure < 140/90 mmHg. ?Lipid reduction therapy with goal LDL-C <100  mg/dL (<70 if symptomatic from PAD).  ? ? ?J. Melene Muller MD MS ?[facility] ?[phone] ?03/24/2022  ?4:19 PM ? ?

## 2022-03-24 NOTE — Progress Notes (Signed)
Physical Therapy Session Note ? ?Patient Details  ?Name: Todd Estrada ?MRN: [id number] ?Date of Birth: Mar 30, 1961 ? ?Today's Date: 03/24/2022 ?PT Individual Time: 3893-7342 ?PT Individual Time Calculation (min): 50 min  ? ?Short Term Goals: ?Week 1:  PT Short Term Goal 1 (Week 1): pt will trasnfer to [facility] with min assist ?PT Short Term Goal 2 (Week 1): Pt will consistently perform bed mobility with min assist ?PT Short Term Goal 3 (Week 1): Pt will ambulate 98f with mod A +2 ? ?Skilled Therapeutic Interventions/Progress Updates:  ? ?Pt received supine in bed. Pt noted to have wrist and lap restraints in place, but not secured to bed. RN notified. Pt pulling at condom catheter and pulled off while PT repositioning bed. Pt responds yes to need to urinate. Attempted to have pt use urinal without success. Supine>sit with min assist. Squat pivot transfer  to the L [facility] for sitting balance on BSC while attempting to use toilet. Pt able to have continent bowel movement sitting on toilet. Max assist from PT to keep pt from reaching to toilet following BM. Max assist intermittently for position of the RUE to protect shoulder and improve postural control .stand pivot transfer back to bed with max assist of 1 and RLE blocked. Sit>supine with min assist for safety and protection of all lines. Scooting in bed with max assist to improve position in bed. Pt secured wrist and lap restraints to prevent pt from pulling trach. Left supine in bed with all needs met.  ?   ? ?Therapy Documentation ?Precautions:  ?Precautions ?Precautions: Fall ?Precaution Comments: posey belt, Bil mitts, R inattention, trach ?Restrictions ?Weight Bearing Restrictions: No ?General: ?PT Amount of Missed Time (min): 10 Minutes ?PT Missed Treatment Reason: Patient fatigue;Patient unwilling to participate ?Vital Signs: ?Therapy Vitals ?Temp: 98.6 ?F (37 ?C) ?Pulse Rate: 91 ?Resp: 18 ?BP: 106/66 ?Patient Position (if appropriate):  Lying ?Oxygen Therapy ?SpO2: 95 % ?O2 Device: Room Air ?O2 Flow Rate (L/min): 5 L/min ?FiO2 (%): 21 % ?Pain: ? Denies  ? ? ? ?Therapy/Group: Individual Therapy ? ?[name]?03/24/2022, 3:23 PM  ?

## 2022-03-24 NOTE — Progress Notes (Addendum)
Nutrition Follow-up ? ?DOCUMENTATION CODES:  ? ?Non-severe (moderate) malnutrition in context of chronic illness ? ?INTERVENTION:  ?Continue bolus feeds using Osmolite 1.5 cal formula via PEG at goal volume of 356 ml (1.5 cartons/ARCs) given QID. ?  ?Provide 45 ml Prosource TF BID per tube.  ?  ?Free water flush of 200 ml q 6 hours per tube.  ?  ?Tube feeding regimen provides 2216 kcal, 111 grams of protein, 1882 ml free water.  ? ?NUTRITION DIAGNOSIS:  ? ?Moderate Malnutrition related to chronic illness (stroke) as evidenced by moderate fat depletion, moderate muscle depletion ? ?GOAL:  ? ?Patient will meet greater than or equal to 90% of their needs; met with TF ? ?MONITOR:  ? ?TF tolerance, Skin, I & O's, Labs, Weight trends ? ?REASON FOR ASSESSMENT:  ? ?Consult ?Enteral/tube feeding initiation and management ? ?ASSESSMENT:  ? ?62 year old male with history of previous L MCA stroke presents after witnessed seizure-like activity at home. Slurred speech and right-sided weakness noted. CTA concerning for occlusion of left ICA. Pt underwent embolectomy and balloon angioplasty on 3/15. MRI 3/17 with findings worrisome for acute left MCA territory infarcts. Percutaneous tracheostomy on 3/22.  PEG placement performed on 3/24. 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 ? ?Pt continues on trach collar. Pt reports no abdominal discomfort at time of visit. Per RN, pt has been tolerating his bolus tube feeds at goal with no difficulties. RN to use ARC cartons of formula to provide bolus feeds. RD to continue with current orders.  ? ?NUTRITION - FOCUSED PHYSICAL EXAM: ? ?Flowsheet Row Most Recent Value  ?Orbital Region No depletion  ?Upper Arm Region Severe depletion  ?Thoracic and Lumbar Region Moderate depletion  ?Buccal Region Unable to assess  ?Temple Region Unable to assess  ?Clavicle Bone Region Severe depletion   ?Clavicle and Acromion Bone Region Moderate depletion  ?Scapular Bone Region Unable to assess  ?Dorsal Hand Unable to assess  ?Patellar Region Moderate depletion  ?Anterior Thigh Region Moderate depletion  ?Posterior Calf Region Mild depletion  ?Edema (RD Assessment) Mild  ?Hair Reviewed  ?Eyes Reviewed  ?Mouth Unable to assess  ?Skin Reviewed  ?Nails Reviewed  ? ?  ? ?Labs and medications reviewed.  ? ?Diet Order:   ?Diet Order   ? ?       ?  Diet NPO time specified Except for: Other (See Comments)  Diet effective now       ?  ? ?  ?  ? ?  ? ? ?EDUCATION NEEDS:  ? ?Not appropriate for education at this time ? ?Skin:  Skin Assessment: Reviewed RN Assessment ?Skin Integrity Issues:: DTI, Other (Comment) ?DTI: R heel ?Other: R, L black 2nd toe ? ?Last BM:  4/4 ? ?Height:  ? ?Ht Readings from Last 1 Encounters:  ?03/05/22 6' (1.829 m)  ? ? ?Weight:  ? ?Wt Readings from Last 1 Encounters:  ?03/22/22 77 kg  ? ?BMI:  Body mass index is 23.02 kg/m?. ? ?Estimated Nutritional Needs:  ? ?Kcal:  2100-2300 ? ?Protein:  105-120 grams ? ?Fluid:  >/= 2 L/day ? ?Corrin Parker, MS, RD, LDN ?RD pager number/after hours weekend pager number on Amion. ? ?

## 2022-03-24 NOTE — Plan of Care (Signed)
?  Problem: RH Balance ?Goal: LTG Patient will maintain dynamic sitting balance (PT) ?Description: LTG:  Patient will maintain dynamic sitting balance with assistance during mobility activities (PT) ?Flowsheets (Taken 03/24/2022 7001) ?LTG: Pt will maintain dynamic sitting balance during mobility activities with:: Supervision/Verbal cueing ?Goal: LTG Patient will maintain dynamic standing balance (PT) ?Description: LTG:  Patient will maintain dynamic standing balance with assistance during mobility activities (PT) ?Flowsheets (Taken 03/24/2022 7494) ?LTG: Pt will maintain dynamic standing balance during mobility activities with:: Contact Guard/Touching assist ?  ?Problem: RH Bed Mobility ?Goal: LTG Patient will perform bed mobility with assist (PT) ?Description: LTG: Patient will perform bed mobility with assistance, with/without cues (PT). ?Flowsheets (Taken 03/24/2022 4967) ?LTG: Pt will perform bed mobility with assistance level of: Supervision/Verbal cueing ?  ?Problem: RH Bed to Chair Transfers ?Goal: LTG Patient will perform bed/chair transfers w/assist (PT) ?Description: LTG: Patient will perform bed to chair transfers with assistance (PT). ?Flowsheets (Taken 03/24/2022 5916) ?LTG: Pt will perform Bed to Chair Transfers with assistance level: Contact Guard/Touching assist ?  ?Problem: RH Car Transfers ?Goal: LTG Patient will perform car transfers with assist (PT) ?Description: LTG: Patient will perform car transfers with assistance (PT). ?Flowsheets (Taken 03/24/2022 3846) ?LTG: Pt will perform car transfers with assist:: Minimal Assistance - Patient > 75% ?  ?Problem: RH Ambulation ?Goal: LTG Patient will ambulate in controlled environment (PT) ?Description: LTG: Patient will ambulate in a controlled environment, # of feet with assistance (PT). ?Flowsheets (Taken 03/24/2022 6599) ?LTG: Pt will ambulate in controlled environ  assist needed:: Minimal Assistance - Patient > 75% ?LTG: Ambulation distance in controlled  environment: 170ft with LRAD ?Goal: LTG Patient will ambulate in home environment (PT) ?Description: LTG: Patient will ambulate in home environment, # of feet with assistance (PT). ?Flowsheets (Taken 03/24/2022 3570) ?LTG: Pt will ambulate in home environ  assist needed:: Minimal Assistance - Patient > 75% ?LTG: Ambulation distance in home environment: 58ft with LRAD ?  ?Problem: RH Wheelchair Mobility ?Goal: LTG Patient will propel w/c in controlled environment (PT) ?Description: LTG: Patient will propel wheelchair in controlled environment, # of feet with assist (PT) ?Flowsheets (Taken 03/24/2022 1779) ?LTG: Pt will propel w/c in controlled environ  assist needed:: Contact Guard/Touching assist ?LTG: Propel w/c distance in controlled environment: 151ft ?Goal: LTG Patient will propel w/c in home environment (PT) ?Description: LTG: Patient will propel wheelchair in home environment, # of feet with assistance (PT). ?Flowsheets (Taken 03/24/2022 3903) ?LTG: Pt will propel w/c in home environ  assist needed:: Contact Guard/Touching assist ?Distance: wheelchair distance in controlled environment: 50 ?  ?Problem: RH Stairs ?Goal: LTG Patient will ambulate up and down stairs w/assist (PT) ?Description: LTG: Patient will ambulate up and down # of stairs with assistance (PT) ?Flowsheets (Taken 03/24/2022 0092) ?LTG: Pt will ambulate up/down stairs assist needed:: Minimal Assistance - Patient > 75% ?LTG: Pt will  ambulate up and down number of stairs: 3 steps to access home ?  ?

## 2022-03-24 NOTE — Plan of Care (Signed)
?  Problem: RH Swallowing ?Goal: LTG Patient will participate in dysphagia therapy to increase swallow function with assistance (SLP) ?Description: LTG:  Patient will participate in dysphagia therapy to increase swallow function with assistance (SLP) ?Flowsheets (Taken 03/24/2022 0835) ?LTG: Pt will participate in dysphagia therapy to increase swallow function with assistance of (SLP): Minimal Assistance - Patient > 75% ?Goal: LTG Pt will demonstrate functional change in swallow as evidenced by bedside/clinical objective assessment (SLP) ?Description: LTG: Patient will demonstrate functional change in swallow as evidenced by bedside/clinical objective assessment (SLP) ?Flowsheets (Taken 03/24/2022 0835) ?LTG: Patient will demonstrate functional change in swallow as evidenced by bedside/clinical objective assessment: Oropharyngeal swallow ?  ?Problem: RH Cognition - SLP ?Goal: RH LTG Patient will demonstrate orientation with cues ?Description:  LTG:  Patient will demonstrate orientation to person/place/time/situation with cues (SLP)   ?Flowsheets (Taken 03/24/2022 0835) ?LTG Patient will demonstrate orientation to: ? Person ? Place ? Time ? Situation ?LTG: Patient will demonstrate orientation using cueing (SLP): Supervision ?  ?Problem: RH Comprehension Communication ?Goal: LTG Patient will comprehend basic/complex auditory (SLP) ?Description: LTG: Patient will comprehend basic/complex auditory information with cues (SLP). ?Flowsheets (Taken 03/24/2022 0835) ?LTG: Patient will comprehend: Basic auditory information ?LTG: Patient will comprehend auditory information with cueing (SLP): Minimal Assistance - Patient > 75% ?  ?Problem: RH Expression Communication ?Goal: LTG Patient will express needs/wants via multi-modal(SLP) ?Description: LTG:  Patient will express needs/wants via multi-modal communication (gestures/written, etc) with cues (SLP) ?Flowsheets (Taken 03/24/2022 0835) ?LTG: Patient will express needs/wants via  multimodal communication (gestures/written, etc) with cueing (SLP): Minimal Assistance - Patient > 75% ?Goal: LTG Patient will verbally express basic/complex needs(SLP) ?Description: LTG:  Patient will verbally express basic/complex needs, wants or ideas with cues  (SLP) ?Flowsheets (Taken 03/24/2022 0835) ?LTG: Patient will verbally express basic/complex needs, wants or ideas (SLP): Minimal Assistance - Patient > 75% ?Goal: LTG Patient will increase speech intelligibility (SLP) ?Description: LTG: Patient will increase speech intelligibility at word/phrase/conversation level with cues, % of the time (SLP) ?Flowsheets (Taken 03/24/2022 0835) ?LTG: Patient will increase speech intelligibility (SLP): Minimal Assistance - Patient > 75% ?  ?Problem: RH Attention ?Goal: LTG Patient will demonstrate this level of attention during functional activites (SLP) ?Description: LTG:  Patient will will demonstrate this level of attention during functional activites (SLP) ?Flowsheets (Taken 03/24/2022 0835) ?Patient will demonstrate during cognitive/linguistic activities the attention type of: Sustained ?Patient will demonstrate this level of attention during cognitive/linguistic activities in: Controlled ?LTG: Patient will demonstrate this level of attention during cognitive/linguistic activities with assistance of (SLP): Minimal Assistance - Patient > 75% ?  ?

## 2022-03-24 NOTE — Progress Notes (Signed)
Came to room for morning breathing tx, physical therapy is in room and request to hold breathing treatment at this time d/t therapy session in progress.  No distress noted.   ?

## 2022-03-24 NOTE — Progress Notes (Signed)
Occupational Therapy Session Note ? ?Patient Details  ?Name: Todd Estrada ?MRN: [id number] ?Date of Birth: 05-30-1961 ? ?Today's Date: 03/24/2022 ?OT Individual Time: [phone] ?OT Individual Time Calculation (min): 46 min  and Today's Date: 03/24/2022 ?OT Missed Time: 15 Minutes ?Missed Time Reason: Other (comment) (dizziness) ? ? ?Short Term Goals: ?Week 1:  OT Short Term Goal 1 (Week 1): Pt will complete 2 grooming tasks at sink with mod A. ?OT Short Term Goal 2 (Week 1): Pt will complete sit to stand at sink with mod A. ?OT Short Term Goal 3 (Week 1): Pt will don shirt with max A. ?OT Short Term Goal 4 (Week 1): Pt will transfer to toilet with max A and LRAD. ? ?Skilled Therapeutic Interventions/Progress Updates:  ?  Pt received semi-reclined in bed, no s/sx pain, appears agreeable to therapy. Placed PSMV. Session focus on self-care retraining, activity tolerance, transfer retraining in prep for improved ADL/IADL/func mobility performance + decreased caregiver burden. Mod A to progress BLE off bed and for initiation of supine > sitting EOB. Close S for static sitting balance. SatO2 at 90+% on RA, 5L humidified O2 at 28% fiO2 throughout session. Offered wash cloth for EOB bathing, poor initiation this date and required total hand over hand assist with L hand to bathe UB/ BLE. Total A to don pants, pt able to assist pulling up past knees. Mod of 2 to power up into standing from elevated bed height with BUE support on therapist/rehab tech hands. Total A to don shirt, pt assist threading LUE. Pt laying back down x2 through session despite encouragement to transfer to TIS. Endorses dizziness with "yeah." BP read at 112/66 (74) after several minutes in semi-reclined in bed. BP declining transferring to TIS and appears to wish to remain in bed, grunts when asked if wishing to remain in bed.  ? ?Attempted to locate soft call bell, none available at time. Will update nurse secretary to order for pt. ? ?Pt left with HOB over  30 degrees, posey soft wrist restraint/belt in place, soft L mit in place with bed alarm engaged, call bell in reach, and all immediate needs met. 4 bed rails up per pt request.  ? ? ?Therapy Documentation ?Precautions:  ?Precautions ?Precautions: Fall ?Precaution Comments: posey belt, Bil mitts, R inattention, trach ?Restrictions ?Weight Bearing Restrictions: No ? ?Pain: ?  No s/sx but endorses dizziness ?ADL: See Care Tool for more details. ? ? ? ?Therapy/Group: Individual Therapy ? ?Volanda Napoleon MS, OTR/L ? ?03/24/2022, 7:17 AM ?

## 2022-03-25 ENCOUNTER — Inpatient Hospital Stay (HOSPITAL_COMMUNITY): Payer: Medicaid Other

## 2022-03-25 DIAGNOSIS — I739 Peripheral vascular disease, unspecified: Secondary | ICD-10-CM

## 2022-03-25 DIAGNOSIS — I63512 Cerebral infarction due to unspecified occlusion or stenosis of left middle cerebral artery: Secondary | ICD-10-CM | POA: Diagnosis not present

## 2022-03-25 LAB — GLUCOSE, CAPILLARY: Glucose-Capillary: 147 mg/dL — ABNORMAL HIGH (ref 70–99)

## 2022-03-25 NOTE — Progress Notes (Signed)
Occupational Therapy Session Note ? ?Patient Details  ?Name: Todd Estrada ?MRN: [id number] ?Date of Birth: 1961/07/12 ? ?Today's Date: 03/25/2022 ?OT Individual Time: 2841-3244 ?OT Individual Time Calculation (min): 36 min  ? ? ?Short Term Goals: ?Week 1:  OT Short Term Goal 1 (Week 1): Pt will complete 2 grooming tasks at sink with mod A. ?OT Short Term Goal 2 (Week 1): Pt will complete sit to stand at sink with mod A. ?OT Short Term Goal 3 (Week 1): Pt will don shirt with max A. ?OT Short Term Goal 4 (Week 1): Pt will transfer to toilet with max A and LRAD. ? ?Skilled Therapeutic Interventions/Progress Updates:  ?  Pt received asleep in bed, appears lethargic but otherwise no s/sx pain. Session focus on self-care retraining, activity tolerance, bed mobility in prep for improved ADL/IADL/func mobility performance + decreased caregiver burden. Came to sitting EOB x2 throughout session with mod A to progress BLE off bed to initiate, otherwise able to complete CGA and maintain static sitting balance CGA. Total A to bathe UB/LB seated EOB, UBD/LBD. Encouraged pt to transfer to TIS > sink , but pt attempting frequently to lie back down. States "yeah" when asked if tired, but otherwise no other verbalization throughout session. Total A this date to incorporate BUE functionally into bathing/dressing tasks. Able to bridge and bed and use L hand to pull up pants over L hip. Total A of 2 to boost up in bed + total A to don B teds/gripper socks. ? ?Pt with L eye closed, not verbalizing and quick to fall asleep after initiating laying down. Pt missed 30 min of therapy due to fatigue. Issued soft call bell but pt asleep and unable to return demonstration of how to use at this time. ? ?SatO2 at 94% on RA via trach collar. ? ?Pt left with HOB at 30 degrees, wrist restraints in place, with 4 bed rails up for pt safety with bed alarm engaged, call bell in reach, and all immediate needs met.  ? ? ?Therapy Documentation ?Precautions:   ?Precautions ?Precautions: Fall ?Precaution Comments: posey belt, Bil mitts, R inattention, trach ?Restrictions ?Weight Bearing Restrictions: No ? ?Pain: no s/sx throughout ?ADL: See Care Tool for more details. ? ? ?Therapy/Group: Individual Therapy ? ?Volanda Napoleon MS, OTR/L ? ?03/25/2022, 6:50 AM ?

## 2022-03-25 NOTE — Progress Notes (Signed)
Patient seen and examined this morning ?Appeared comfortable ?Doppler located, monophasic PT signals bilaterally ?No changes to wounds on his feet. ?Denies sensorimotor deficits by shaking his head ? ?We will follow-up ABI.  ?This is not acute, and therefore can be followed up in the outpatient setting. ? ?I will set outpatient follow-up ? ?Todd Estrada ? ?

## 2022-03-25 NOTE — IPOC Note (Addendum)
Overall Plan of Care (IPOC) ?Patient Details ?Name: Todd Estrada ?MRN: [id number] ?DOB: 15-Sep-1961 ? ?Admitting Diagnosis: Acute ischemic left MCA stroke ([place]) ? ?Hospital Problems: Principal Problem: ?  Acute ischemic left MCA stroke ([place]) ? ? ? ? Functional Problem List: ?Nursing Bladder, Bowel, Medication Management, Safety, Pain, Nutrition, Endurance  ?PT Balance, Behavior, Edema, Endurance, Motor, Nutrition, Pain, Perception, Safety, Sensory, Skin Integrity  ?OT Balance, Behavior, Cognition, Endurance, Motor, Nutrition, Perception, Safety, Vision, Sensory, Skin Integrity  ?SLP Cognition, Linguistic, Nutrition  ?TR    ?    ? Basic ADL?s: ?OT Grooming, Bathing, Dressing, Toileting  ? ?  Advanced  ADL?s: ?OT    ?   ?Transfers: ?PT Bed Mobility, Bed to Chair, Car, Floor, Furniture  ?OT Toilet, Tub/Shower  ? ?  Locomotion: ?PT Ambulation, Wheelchair Mobility, Stairs  ? ?  Additional Impairments: ?OT Fuctional Use of Upper Extremity  ?SLP Swallowing, Communication, Social Cognition ?expression, comprehension ?Awareness, Social Interaction  ?TR    ? ? ?Anticipated Outcomes ?Item Anticipated Outcome  ?Self Feeding S/ NPO  ?Swallowing ? min A ?  ?Basic self-care ? min A  ?Toileting ? min A ?  ?Bathroom Transfers min A  ?Bowel/Bladder ? manage bowel w mod I and bladder w toileting  ?Transfers ? CGA with LRAD  ?Locomotion ? min-mod assist ambulatory with LRAD  ?Communication ? mod-to-max A  ?Cognition ? mod-to-max A  ?Pain ? pain at or below level 4 w prns  ?Safety/Judgment ? maintain safety w cues  ? ?Therapy Plan: ?PT Intensity: Minimum of 1-2 x/day ,45 to 90 minutes ?PT Frequency: 5 out of 7 days ?PT Duration Estimated Length of Stay: 24-28day ?OT Intensity: Minimum of 1-2 x/day, 45 to 90 minutes ?OT Frequency: 5 out of 7 days ?OT Duration/Estimated Length of Stay: 3.5 - 4 weeks ?SLP Intensity: Minumum of 1-2 x/day, 30 to 90 minutes ?SLP Frequency: 3 to 5 out of 7 days ?SLP Duration/Estimated Length of Stay: 3.5-4 weeks   ? ?Due to the current state of emergency, patients may not be receiving their 3-hours of Medicare-mandated therapy. ? ? Team Interventions: ?Nursing Interventions Bladder Management, Disease Management/Prevention, Medication Management, Pain Management, Bowel Management, Patient/Family Education  ?PT interventions Ambulation/gait training, Discharge planning, Functional mobility training, Psychosocial support, Therapeutic Activities, Visual/perceptual remediation/compensation, Balance/vestibular training, Neuromuscular re-education, Disease management/prevention, Skin care/wound management, Therapeutic Exercise, Wheelchair propulsion/positioning, Cognitive remediation/compensation, DME/adaptive equipment instruction, Pain management, Splinting/orthotics, UE/LE Strength taining/ROM, Community reintegration, Functional electrical stimulation, Stair training, UE/LE Coordination activities, Patient/family education  ?OT Interventions Balance/vestibular training, Disease mangement/prevention, Neuromuscular re-education, Self Care/advanced ADL retraining, Therapeutic Exercise, Wheelchair propulsion/positioning, UE/LE Strength taining/ROM, Skin care/wound managment, Pain management, DME/adaptive equipment instruction, Cognitive remediation/compensation, Community reintegration, Functional electrical stimulation, Patient/family education, Splinting/orthotics, UE/LE Coordination activities, Visual/perceptual remediation/compensation, Therapeutic Activities, Psychosocial support, Functional mobility training, Discharge planning  ?SLP Interventions Cueing hierarchy, Cognitive remediation/compensation, Dysphagia/aspiration precaution training, Environmental controls, Functional tasks, Internal/external aids, Multimodal communication approach, Patient/family education, Speech/Language facilitation, Therapeutic Activities  ?TR Interventions    ?SW/CM Interventions Disease Management/Prevention, Patient/Family Education,  Psychosocial Support, Discharge Planning  ? ?Barriers to Discharge ?MD  Medical stability, Trach, Incontinence, and Nutritional means  ?Nursing Decreased caregiver support, Home environment access/layout ?1 level 2 ste no rails w family; has RW, SPC, 3N1 and w/c  ?PT Decreased caregiver support, Inaccessible home environment, Home environment access/layout, Trach, Incontinence, Wound Care, Lack of/limited family support, Insurance for SNF coverage, Medication compliance, Behavior ?   ?OT Insurance for SNF coverage, Incontinence, Trach, Decreased caregiver support, Nutrition means ?   ?  SLP   ?   ?[place] for SNF coverage, Lack of/limited family support, Decreased caregiver support ?   ? ?Team Discharge Planning: ?Destination: PT-Skilled Nursing Facility (SNF) ,OT- Home , SLP-Home ?Projected Follow-up: PT-Skilled nursing facility, OT-  Skilled nursing facility, 24 hour supervision/assistance, SLP-Home Health SLP, 24 hour supervision/assistance ?Projected Equipment Needs: PT-To be determined, OT- To be determined, SLP-To be determined ?Equipment Details: PT- , OT-  ?Patient/family involved in discharge planning: PT- Patient unable/family or caregiver not available,  OT-Patient unable/family or caregiver not available, SLP-Patient unable/family or caregive not available ? ?MD ELOS: 21d ?Medical Rehab Prognosis:  Guarded ?Assessment: The patient has been admitted for CIR therapies with the diagnosis of Left MCA infarct. The team will be addressing functional mobility, strength, stamina, balance, safety, adaptive techniques and equipment, self-care, bowel and bladder mgt, patient and caregiver education, G tube and Artist . Goals have been set at [place]. Anticipated discharge destination is Home. ? ?Due to the current state of emergency, patients may not be receiving their 3 hours per day of Medicare-mandated therapy.  ? ? ?See Team Conference Notes for weekly updates to the plan of care ? ?

## 2022-03-25 NOTE — Progress Notes (Signed)
Physical Therapy Session Note ? ?Patient Details  ?Name: Todd Estrada ?MRN: [id number] ?Date of Birth: 03-31-1961 ? ?Today's Date: 03/25/2022 ?PT Individual Time: 4098-1191 ?PT Individual Time Calculation (min): 26 min  ? ?Short Term Goals: ?Week 1:  PT Short Term Goal 1 (Week 1): pt will trasnfer to [facility] with min assist ?PT Short Term Goal 2 (Week 1): Pt will consistently perform bed mobility with min assist ?PT Short Term Goal 3 (Week 1): Pt will ambulate 97ft with mod A +2 ? ?Skilled Therapeutic Interventions/Progress Updates:  ?Patient supine in bed on entrance to room. Patient alert and variably agreeable to PT session. Resp Tech present and teaching RN re: suction of pt's tracheostomy.  ? ?Patient with no pain complaint throughout session. ? ?Wrist restraints removed and L mitt removed.  ? ?Therapeutic Activity: ?Attempt to build therapeutic alliance with pt for increased rapport and willingness to participate in sessions. Warm washcloth applied to pt's face to provide comfort as well as to stimulate pt as he intermittently opens eyes to look at therapist. Pt awake and requiring consistent cueing and encouragement to participate.  ?Bed Mobility: Attempt to sit up with MaxA to bring BLE off EOB and pt bringing LE back to bed surface. Continued attempts to reach seated position with pt stating "no". Education provided on need for pt to participate for continued progress.  ? ?Patient supine  in bed at end of session with brakes locked, bed alarm set, and all needs within reach. Wrist restraints and L hand mitt reapplied.  ? ? ?Therapy Documentation ?Precautions:  ?Precautions ?Precautions: Fall ?Precaution Comments: posey belt, Bil mitts, R inattention, trach ?Restrictions ?Weight Bearing Restrictions: No ?General: ?PT Missed Treatment Reason: Patient fatigue;Patient unwilling to participate;Other (Comment) (Resp Tech present to educate nursing on trach care and suctioning) ?Vital Signs: ?Therapy Vitals ?Pulse Rate:  91 ?Resp: 18 ?Patient Position (if appropriate): Lying ?Oxygen Therapy ?SpO2: 95 % ?O2 Device: Tracheostomy Collar ?O2 Flow Rate (L/min): 5 L/min ?FiO2 (%): 21 % ?Pain: ?Pain Assessment ?Pain Scale: PAINAD ?Pain Score: 0-No pain ?Faces Pain Scale: No hurt ? ?Therapy/Group: Individual Therapy ? ?Alger Simons PT, DPT ?03/25/2022, 12:50 PM  ?

## 2022-03-25 NOTE — Progress Notes (Signed)
ABI has been completed.  ? ?Preliminary results in CV Proc.  ? ?Pharrell Ledford Uzziah Rigg ?03/25/2022 2:27 PM    ?

## 2022-03-25 NOTE — Progress Notes (Addendum)
Physical Therapy Session Note ? ?Patient Details  ?Name: Todd Estrada ?MRN: [id number] ?Date of Birth: July 03, 1961 ? ?Today's Date: 03/25/2022 ?PT Individual Time: 1798-1025 ?PT Individual Time Calculation (min): 60 min  ? ?Short Term Goals: ?Week 1:  PT Short Term Goal 1 (Week 1): pt will trasnfer to [facility] with min assist ?PT Short Term Goal 2 (Week 1): Pt will consistently perform bed mobility with min assist ?PT Short Term Goal 3 (Week 1): Pt will ambulate 4f with mod A +2 ? ? ?Skilled Therapeutic Interventions/Progress Updates:  ? ?Bed mobility with min assist and cues for attention to the RLE. .  ? ?Transfers for sit<>stand and stand pivot with min=mod assist and moderate cues for safety and improved awareness of the RLE.  ? ?Gait at rail in hall with max assist +2 with max assist for attention to and improved step length on the R side. Max encouragement for for increased distances prior to sitting with poor carryover.  ? ?Kinetron reciprocal movement 3 x 2 min with max assist and cues for full ROM of BLE with only occasional reciprocal activation of R and L LE.   ? ?Pt returned to room and performed squat pivot transfer to bed with mid assist. Sit>supine completed with min assist, and left supine in bed with call bell in reach and all needs met.  ? ?   ? ?Therapy Documentation ?Precautions:  ?Precautions ?Precautions: Fall ?Precaution Comments: posey belt, Bil mitts, R inattention, trach ?Restrictions ?Weight Bearing Restrictions: No ?General: ?PT Amount of Missed Time (min): 15 Minutes ?PT Missed Treatment Reason: Patient fatigue ?Vital Signs: ?Therapy Vitals ?Temp: 98.4 ?F (36.9 ?C) ?Pulse Rate: 99 ?Resp: 18 ?BP: 113/79 ?Patient Position (if appropriate): Lying ?Oxygen Therapy ?SpO2: 95 % ?O2 Device: Tracheostomy Collar ?O2 Flow Rate (L/min): 5 L/min ?FiO2 (%): 21 % ?Pain: ?Faces: none.  ? ? ? ?Therapy/Group: Individual Therapy ? ?[name]?03/25/2022, 4:44 PM  ?

## 2022-03-25 NOTE — Progress Notes (Signed)
Speech Language Pathology Daily Session Note ? ?Patient Details  ?Name: Todd Estrada ?MRN: [id number] ?Date of Birth: 1961/10/31 ? ?Today's Date: 03/25/2022 ?SLP Individual Time: 5790-3833 ?SLP Individual Time Calculation (min): 40 min ? ?Short Term Goals: ?Week 1: SLP Short Term Goal 1 (Week 1): Patient will demonstrate improved alertness in order to functionally participate in further cognitive-linguistic/language assessment. ?SLP Short Term Goal 2 (Week 1): Patient will respond to yes/no questions via multimodal communication with 50% accuracy in regards to biographical and environmental information with max A verbal/visual cues ?SLP Short Term Goal 3 (Week 1): Patient maintain arousal and in order to tolerate PMSV as evidenced by SpO2>92% for 15 minute duration. ?SLP Short Term Goal 4 (Week 1): Patient will consume therapeutic PO trials following oral care with mod A cues for implementation of safe swallowing precautions and strategies ?SLP Short Term Goal 5 (Week 1): Patient will be oriented to person/place/time/situation with max A multimodal cues ?SLP Short Term Goal 6 (Week 1): Pt will verbalize basic wants/needs via multimodal communication and max A cues ? ?Skilled Therapeutic Interventions: Skilled treatment session focused on cognitive and communication goals. Upon arrival, patient was asleep in bed. Patient awakened with voice but required frequent verbal and tactile cues to maintain arousal. PMSV donned and vitals remained WFL throughout entirety of session. Patient with frequent coughing with secretions expelling from the trach hub despite PMSV being in place. Patient answered basic yes/no questions in regards to wants/needs but required total A for orientation while using yes/no questions. When patient attempted to verbalize spontaneously, it was essentially unintelligible. Patient had been incontinent of urine and had soaked through the sheets. Patient followed commands for bed mobility with extra time  and Min verbal and tactile cues. Patient repositioned in bed with patient remaining lethargic. PMSV removed, bilateral restraints and mitts in place with alarm on and all needs within reach. Continue with current plan of care.  ?   ? ?Pain ?No/Denies Pain  ? ?Therapy/Group: Individual Therapy ? ?Todd Estrada ?03/25/2022, 3:22 PM ?

## 2022-03-25 NOTE — Progress Notes (Signed)
?                                                       PROGRESS NOTE ? ? ?Subjective/Complaints: ? ?Globally aphasic ,  ?Appreciate VVS consult , ABIs pending  ?ROS- unable due to aphasia  ?Objective: ?  ?DG CHEST PORT 1 VIEW ? 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 Hyperinflation with background emphysema. Left upper lobe perihilar mass and right mid and lower lung nodules again noted better demonstrated by comparison chest CT. No enlarging effusion or pneumothorax. No new superimposed acute airspace process or pneumonia. Trachea midline.  Aorta atherosclerotic.  No acute osseous finding. IMPRESSION: Stable hyperinflation compatible with COPD/emphysema. Similar left perihilar mass and right lung nodules, again concerning for malignancy. Please refer to the recent chest CT. No superimposed acute chest process by plain radiography. Electronically Signed   By: Jerilynn Mages.  Shick M.D.   On: 03/23/2022 13:21  ? ?VAS [place] LOWER EXTREMITY VENOUS (DVT) ? ?Result Date: 03/23/2022 ? Lower Venous DVT Study Patient Name:  Todd Estrada  Date of Exam:   03/23/2022 Medical Rec #: [id number]      Accession #:    [id number] Date of Birth: 10-Dec-1961      Patient Gender: M Patient Age:   61 years Exam Location:  [facility] Procedure:      VAS [place] LOWER EXTREMITY VENOUS (DVT) Referring Phys: Risa Grill --------------------------------------------------------------------------------  Indications: Edema, ulceration.  Comparison Study: No prior studies. Performing Technologist: Darlin Coco RDMS, RVT  Examination Guidelines: A complete evaluation includes B-mode imaging, spectral Doppler, color Doppler, and power Doppler as needed of all accessible portions of each vessel. Bilateral testing is considered an integral part of a complete examination. Limited  examinations for reoccurring indications may be performed as noted. The reflux portion of the exam is performed with the patient in reverse Trendelenburg.  +---------+---------------+---------+-----------+----------+--------------+ RIGHT    CompressibilityPhasicitySpontaneityPropertiesThrombus Aging +---------+---------------+---------+-----------+----------+--------------+ CFV      Full           Yes      Yes                                 +---------+---------------+---------+-----------+----------+--------------+ SFJ      Full                                                        +---------+---------------+---------+-----------+----------+--------------+ FV Prox  Full                                                        +---------+---------------+---------+-----------+----------+--------------+ FV Mid   Full                                                        +---------+---------------+---------+-----------+----------+--------------+  FV DistalFull                                                        +---------+---------------+---------+-----------+----------+--------------+ PFV      Full                                                        +---------+---------------+---------+-----------+----------+--------------+ POP      Full           Yes      Yes                                 +---------+---------------+---------+-----------+----------+--------------+ PTV      Full                                                        +---------+---------------+---------+-----------+----------+--------------+ PERO     Full                                                        +---------+---------------+---------+-----------+----------+--------------+ Incidental: Superficial femoral artery occlusion.    Summary: RIGHT: - There is no evidence of deep vein thrombosis in the lower extremity.  - A cystic structure is found in the popliteal fossa.  -  Incidental: Right superficial femoral artery occlusion with reconstitution at the popliteal artery.   *See table(s) above for measurements and observations. Electronically signed by Orlie Pollen on 03/23/2022 at 4:43:41 PM.    Final    ?Recent Labs  ?  03/23/22 ?1443  ?WBC 14.9*  ?HGB 13.9  ?HCT 42.6  ?PLT 410*  ? ? ?Recent Labs  ?  03/23/22 ?1540  ?NA 135  ?K 4.2  ?CL 102  ?CO2 25  ?GLUCOSE 130*  ?BUN 24*  ?CREATININE 0.76  ?CALCIUM 8.6*  ? ? ?No intake or output data in the 24 hours ending 03/25/22 0953 ?  ? ?Pressure Injury 03/22/22 Heel Right;Lateral Deep Tissue Pressure Injury - Purple or maroon localized area of discolored intact skin or blood-filled blister due to damage of underlying soft tissue from pressure and/or shear. (Active)  ?03/22/22 1500  ?Location: Heel  ?Location Orientation: Right;Lateral  ?Staging: Deep Tissue Pressure Injury - Purple or maroon localized area of discolored intact skin or blood-filled blister due to damage of underlying soft tissue from pressure and/or shear.  ?Wound Description (Comments):   ?Present on Admission: Yes  ? ? ?Physical Exam: ?Vital Signs ?Blood pressure 120/72, pulse 95, temperature 98.5 ?F (36.9 ?C), temperature source Oral, resp. rate 18, weight 77 kg, SpO2 96 %. ? ?Left gaze prefernece , non verbal  ?General: No acute distress ?Mood and affect are appropriate ?Heart: Regular rate and rhythm no rubs murmurs or extra sounds ?Lungs: Clear to auscultation, breathing unlabored, no rales or wheezes ?  Abdomen: Positive bowel sounds, soft nontender to palpation, nondistended ?PEG site LUQ, no drainage or tenderness  ?Extremities: No clubbing, cyanosis, or edema, IV site L arm CDI  ?Skin: No evidence of breakdown, no evidence of rash ?Neurologic: Cranial nerves II through XII intact, motor strength is 4/5 in Left , 0/5 RIght deltoid, bicep, tricep, grip, 4/5 Left and 2/5 Ri proximal hip flexor, knee extensors, ankle dorsiflexor and plantar flexor ? ?Musculoskeletal: Full  range of motion in all 4 extremities. No joint swelling ? ? ?Assessment/Plan: ?1. Functional deficits which require 3+ hours per day of interdisciplinary therapy in a comprehensive inpatient rehab setting. ?Physiatrist is providing close team supervision and 24 hour management of active medical problems listed below. ?Physiatrist and rehab team continue to assess barriers to discharge/monitor patient progress toward functional and medical goals ? ?Care Tool: ? ?Bathing ?   ?Body parts bathed by patient: Face  ?   ?  ?  ?Bathing assist Assist Level: Supervision/Verbal cueing ?  ?  ?Upper Body Dressing/Undressing ?Upper body dressing   ?What is the patient wearing?: [place] only ?   ?Upper body assist Assist Level: Total Assistance - Patient < 25% ?   ?Lower Body Dressing/Undressing ?Lower body dressing ? ? ?   ?What is the patient wearing?: Pants ? ?  ? ?Lower body assist Assist for lower body dressing: Total Assistance - Patient < 25% ?   ? ?Toileting ?Toileting    ?Toileting assist Assist for toileting: Dependent - Patient 0% ?  ?  ?Transfers ?Chair/bed transfer ? ?Transfers assist ?   ? ?Chair/bed transfer assist level: Dependent - mechanical lift (stedy) ?  ?  ?Locomotion ?Ambulation ? ? ?Ambulation assist ? ?   ? ?Assist level: 2 helpers ?Assistive device: Other (comment) ?Max distance: 10  ? ?Walk 10 feet activity ? ? ?Assist ?   ? ?Assist level: 2 helpers ?Assistive device: Other (comment)  ? ?Walk 50 feet activity ? ? ?Assist Walk 50 feet with 2 turns activity did not occur: Safety/medical concerns ? ?  ?   ? ? ?Walk 150 feet activity ? ? ?Assist Walk 150 feet activity did not occur: Safety/medical concerns ? ?  ?  ?  ? ?Walk 10 feet on uneven surface  ?activity ? ? ?Assist Walk 10 feet on uneven surfaces activity did not occur: Safety/medical concerns ? ? ?  ?   ? ?Wheelchair ? ? ? ? ?Assist Is the patient using a wheelchair?: Yes ?  ?  ? ?Wheelchair assist level: Dependent - Patient 0% ?Max wheelchair  distance: 150  ? ? ?Wheelchair 50 feet with 2 turns activity ? ? ? ?Assist ? ?  ?  ? ? ?Assist Level: Dependent - Patient 0%  ? ?Wheelchair 150 feet activity  ? ? ? ?Assist ?   ? ? ?Assist Level: Dependent - Patient 0%  ? ?Blood pressure 120/72, pulse 95, temperature 98.5

## 2022-03-26 DIAGNOSIS — I63512 Cerebral infarction due to unspecified occlusion or stenosis of left middle cerebral artery: Secondary | ICD-10-CM | POA: Diagnosis not present

## 2022-03-26 LAB — CBC WITH DIFFERENTIAL/PLATELET
Abs Immature Granulocytes: 0.03 10*3/uL (ref 0.00–0.07)
Basophils Absolute: 0 10*3/uL (ref 0.0–0.1)
Basophils Relative: 0 %
Eosinophils Absolute: 0 10*3/uL (ref 0.0–0.5)
Eosinophils Relative: 0 %
HCT: 45.5 % (ref 39.0–52.0)
Hemoglobin: 15 g/dL (ref 13.0–17.0)
Immature Granulocytes: 0 %
Lymphocytes Relative: 13 %
Lymphs Abs: 1.2 10*3/uL (ref 0.7–4.0)
MCH: 30.1 pg (ref 26.0–34.0)
MCHC: 33 g/dL (ref 30.0–36.0)
MCV: 91.2 fL (ref 80.0–100.0)
Monocytes Absolute: 1.1 10*3/uL — ABNORMAL HIGH (ref 0.1–1.0)
Monocytes Relative: 12 %
Neutro Abs: 6.6 10*3/uL (ref 1.7–7.7)
Neutrophils Relative %: 75 %
Platelets: 321 10*3/uL (ref 150–400)
RBC: 4.99 MIL/uL (ref 4.22–5.81)
RDW: 13.3 % (ref 11.5–15.5)
WBC: 9 10*3/uL (ref 4.0–10.5)
nRBC: 0 % (ref 0.0–0.2)

## 2022-03-26 LAB — URINALYSIS, ROUTINE W REFLEX MICROSCOPIC
Bilirubin Urine: NEGATIVE
Glucose, UA: NEGATIVE mg/dL
Ketones, ur: NEGATIVE mg/dL
Nitrite: NEGATIVE
Protein, ur: 100 mg/dL — AB
Specific Gravity, Urine: 1.009 (ref 1.005–1.030)
WBC, UA: >50 WBC/hpf — ABNORMAL HIGH (ref 0–5)
pH: 6 (ref 5.0–8.0)

## 2022-03-26 MED ORDER — CEPHALEXIN 250 MG PO CAPS
500.0000 mg | ORAL_CAPSULE | Freq: Two times a day (BID) | ORAL | Status: AC
  Administered 2022-03-26 – 2022-04-02 (×14): 500 mg via ORAL
  Filled 2022-03-26 (×14): qty 2

## 2022-03-26 NOTE — Progress Notes (Signed)
?                                                       PROGRESS NOTE ? ? ?Subjective/Complaints: ?Desats to 87-89% with trach collar, 94-95% after placed on ATC by RT.  ?No complaints to me this AM, somnolent ?ABI reviewed, vascular is following ? ?ROS: +secretions ? ?Objective: ?  ?VAS [place] ABI WITH/WO TBI ? ?Result Date: 03/25/2022 ? LOWER EXTREMITY DOPPLER STUDY Patient Name:  Todd Estrada  Date of Exam:   03/25/2022 Medical Rec #: [id number]      Accession #:    [id number] Date of Birth: [birth year]      Patient Gender: M Patient Age:   61 years Exam Location:  [facility] Procedure:      VAS [place] ABI WITH/WO TBI Referring Phys: Risa Grill --------------------------------------------------------------------------------  Indications: Peripheral artery disease. High Risk Factors: Hypertension.  Limitations: Today's exam was limited due to involuntary patient movement. Comparison Study: no prior Performing Technologist: Archie Patten RVS  Examination Guidelines: A complete evaluation includes at minimum, Doppler waveform signals and systolic blood pressure reading at the level of bilateral brachial, anterior tibial, and posterior tibial arteries, when vessel segments are accessible. Bilateral testing is considered an integral part of a complete examination. Photoelectric Plethysmograph (PPG) waveforms and toe systolic pressure readings are included as required and additional duplex testing as needed. Limited examinations for reoccurring indications may be performed as noted.  ABI Findings: +---------+------------------+-----+----------+--------------------------------+ Right    Rt Pressure (mmHg)IndexWaveform  Comment                          +---------+------------------+-----+----------+--------------------------------+ Brachial 125                    triphasic                                  +---------+------------------+-----+----------+--------------------------------+ PTA      61                 0.49 monophasic                                 +---------+------------------+-----+----------+--------------------------------+ DP       64                0.51 monophasic                                 +---------+------------------+-----+----------+--------------------------------+ Great Toe                                 unable to obtain tbi due to                                                patient movement                 +---------+------------------+-----+----------+--------------------------------+ +---------+------------------+-----+-------------------+-----------------------+ Left  Lt Pressure (mmHg)IndexWaveform           Comment                 +---------+------------------+-----+-------------------+-----------------------+ Brachial 114                    triphasic                                  +---------+------------------+-----+-------------------+-----------------------+ PTA      51                0.41 monophasic                                 +---------+------------------+-----+-------------------+-----------------------+ DP       41                0.33 dampened monophasic                        +---------+------------------+-----+-------------------+-----------------------+ Great Toe                                          unable to obtain tbi                                                       due to patient movement +---------+------------------+-----+-------------------+-----------------------+ +-------+-----------+-----------+------------+------------+ ABI/TBIToday's ABIToday's TBIPrevious ABIPrevious TBI +-------+-----------+-----------+------------+------------+ Right  0.51                                           +-------+-----------+-----------+------------+------------+ Left   0.41                                           +-------+-----------+-----------+------------+------------+  Summary:  Right: Resting right ankle-brachial index indicates moderate right lower extremity arterial disease. Left: Resting left ankle-brachial index indicates severe left lower extremity arterial disease. *See table(s) above for measurements and observations.  Electronically signed by Orlie Pollen on 03/25/2022 at 3:30:47 PM.    Final    ?No results for input(s): WBC, HGB, HCT, PLT in the last 72 hours. ?No results for input(s): NA, K, CL, CO2, GLUCOSE, BUN, CREATININE, CALCIUM in the last 72 hours. ? ?Intake/Output Summary (Last 24 hours) at 03/26/2022 1300 ?Last data filed at 03/26/2022 0201 ?Gross per 24 hour  ?Intake --  ?Output 900 ml  ?Net -900 ml  ?  ? ?Pressure Injury 03/22/22 Heel Right;Lateral Deep Tissue Pressure Injury - Purple or maroon localized area of discolored intact skin or blood-filled blister due to damage of underlying soft tissue from pressure and/or shear. (Active)  ?03/22/22 1500  ?Location: Heel  ?Location Orientation: Right;Lateral  ?Staging: Deep Tissue Pressure Injury - Purple or maroon localized area of discolored intact skin or blood-filled blister due to damage of underlying soft tissue from pressure and/or shear.  ?Wound Description (Comments):   ?Present on Admission: Yes  ? ? ?  Physical Exam: ?Vital Signs ?Blood pressure 108/65, pulse 96, temperature 98.7 ?F (37.1 ?C), temperature source Oral, resp. rate 18, weight 77 kg, SpO2 94 %. ?Gen: no distress, normal appearing ?HEENT: oral mucosa pink and moist, NCAT ?Cardio: Reg rate ?Chest: normal effort, normal rate of breathing ?Abd: soft, non-distended ?Ext: no edema ?Psych: pleasant, normal affect ?Skin: ?PEG site LUQ, no drainage or tenderness  ?Extremities: No clubbing, cyanosis, or edema, IV site L arm CDI  ?Skin: No evidence of breakdown, no evidence of rash ?Neurologic: Cranial nerves II through XII intact, motor strength is 4/5 in Left , 0/5 RIght deltoid, bicep, tricep, grip, 4/5 Left and 2/5 Ri proximal hip flexor, knee extensors, ankle  dorsiflexor and plantar flexor ? ?Musculoskeletal: Full range of motion in all 4 extremities. No joint swelling ? ? ?Assessment/Plan: ?1. Functional deficits which require 3+ hours per day of interdisciplinary therapy in a comprehensive inpatient rehab setting. ?Physiatrist is providing close team supervision and 24 hour management of active medical problems listed below. ?Physiatrist and rehab team continue to assess barriers to discharge/monitor patient progress toward functional and medical goals ? ?Care Tool: ? ?Bathing ?   ?Body parts bathed by patient: Face  ?   ?  ?  ?Bathing assist Assist Level: Maximal Assistance - Patient 24 - 49% (Per OT eval note for bathing both the upper and lower body parts) ?  ?  ?Upper Body Dressing/Undressing ?Upper body dressing   ?What is the patient wearing?: [place] only ?   ?Upper body assist Assist Level: Total Assistance - Patient < 25% ?   ?Lower Body Dressing/Undressing ?Lower body dressing ? ? ?   ?What is the patient wearing?: Pants ? ?  ? ?Lower body assist Assist for lower body dressing: Total Assistance - Patient < 25% ?   ? ?Toileting ?Toileting    ?Toileting assist Assist for toileting: Dependent - Patient 0% ?  ?  ?Transfers ?Chair/bed transfer ? ?Transfers assist ?   ? ?Chair/bed transfer assist level: Dependent - mechanical lift (stedy) ?  ?  ?Locomotion ?Ambulation ? ? ?Ambulation assist ? ?   ? ?Assist level: 2 helpers ?Assistive device: Other (comment) ?Max distance: 10  ? ?Walk 10 feet activity ? ? ?Assist ?   ? ?Assist level: 2 helpers ?Assistive device: Other (comment)  ? ?Walk 50 feet activity ? ? ?Assist Walk 50 feet with 2 turns activity did not occur: Safety/medical concerns ? ?  ?   ? ? ?Walk 150 feet activity ? ? ?Assist Walk 150 feet activity did not occur: Safety/medical concerns ? ?  ?  ?  ? ?Walk 10 feet on uneven surface  ?activity ? ? ?Assist Walk 10 feet on uneven surfaces activity did not occur: Safety/medical concerns ? ? ?  ?    ? ?Wheelchair ? ? ? ? ?Assist Is the patient using a wheelchair?: Yes ?Type of Wheelchair: Manual (per PT note, TIS w/c) ?  ? ?Wheelchair assist level: Dependent - Patient 0% ?Max wheelchair distance: 150  ? ? ?Wheelchair 50 feet with 2 turns activity ? ? ? ?Assist ? ?  ?  ? ? ?Assist Level: Dependent

## 2022-03-26 NOTE — Progress Notes (Signed)
Occupational Therapy Session Note ? ?Patient Details  ?Name: Todd Estrada ?MRN: [id number] ?Date of Birth: 01-23-1961 ? ?Today's Date: 03/26/2022 ?OT Individual Time: 1191-4782 ?OT Individual Time Calculation (min): 28 min  and Today's Date: 03/26/2022 ?OT Missed Time: 45 Minutes ?Missed Time Reason: Patient fatigue;Other (comment) (lethargic/somnolent) ? ? ?Short Term Goals: ?Week 1:  OT Short Term Goal 1 (Week 1): Pt will complete 2 grooming tasks at sink with mod A. ?OT Short Term Goal 2 (Week 1): Pt will complete sit to stand at sink with mod A. ?OT Short Term Goal 3 (Week 1): Pt will don shirt with max A. ?OT Short Term Goal 4 (Week 1): Pt will transfer to toilet with max A and LRAD. ? ?Skilled Therapeutic Interventions/Progress Updates:  ?  Pt received semi-reclined in bed, appears lethargic. Intermittently opening eyes to voice/touch. Brief found to be saturated with urine. Total A to roll R/L to perform pericare/brief change. Pt able to hold onto bed rail with LUE to assist in care. Pt unable to assist boosting up in bed despite cues to push through BLE. Total A + 2 to boost up. Pt unable to follow one step commands this date, session terminated early due to lethargy/somnolence. Pt missed 45 min of scheduled OT. SatO2 at 91% on RA via trach collar throughout session, HR at 99 bpm. ? ? ?Pt left with HOB over 30 degrees, soft wrist/mit restraints in place, heels floated, 4 bed rails up for pt safety with bed alarm engaged, call bell in reach, and all immediate needs met.  ? ? ?Therapy Documentation ?Precautions:  ?Precautions ?Precautions: Fall ?Precaution Comments: posey belt, Bil mitts, R inattention, trach ?Restrictions ?Weight Bearing Restrictions: No ? ?Pain:  no s/sx throughout session ?  ?ADL: See Care Tool for more details. ? ?Therapy/Group: Individual Therapy ? ?Volanda Napoleon MS, OTR/L ? ?03/26/2022, 6:52 AM ?

## 2022-03-26 NOTE — Progress Notes (Signed)
Physical Therapy Session Note ? ?Patient Details  ?Name: Todd Estrada ?MRN: [id number] ?Date of Birth: [birth year] ? ?Today's Date: 03/26/2022 ?PT Individual Time: 3159-4585 ?PT Individual Time Calculation (min): 9 min  ? ?Short Term Goals: ?Week 1:  PT Short Term Goal 1 (Week 1): pt will trasnfer to [facility] with min assist ?PT Short Term Goal 2 (Week 1): Pt will consistently perform bed mobility with min assist ?PT Short Term Goal 3 (Week 1): Pt will ambulate 33f with mod A +2 ? ? ?Skilled Therapeutic Interventions/Progress Updates:  ? ?Pt received supine in bed. Noted to have copious amounts of sputum around distal trach. PT performed suction of sputum from distal trach and trach dressing. PT removed wrist restraints in attempt to performed PT treatment. Pt strongly encouraged to perform physical activity. Pt repeated saying no and shaking head. Noted to start to move unrestrained had toward trach. Attempted to distract pt by assisting to supine>sit. Pt resisting assistance from PT to come to sitting. Continues to decline performing physical activity. PT reattached RUE wrist restraint. RN notified of trach secretions. Pt left supine in bed with call bell in reach and all needs met.    ?   ? ?Therapy Documentation ?Precautions:  ?Precautions ?Precautions: Fall ?Precaution Comments: posey belt, Bil mitts, R inattention, trach ?Restrictions ?Weight Bearing Restrictions: No ?General: ?PT Amount of Missed Time (min): 51 Minutes ?PT Missed Treatment Reason: Patient unwilling to participate ?Vital Signs: ?Therapy Vitals ?Temp: 97.8 ?F (36.6 ?C) ?Temp Source: Axillary ?Pulse Rate: (!) 102 ?Resp: 14 ?BP: 105/76 ?Patient Position (if appropriate): Lying ?Oxygen Therapy ?SpO2: 94 % ?O2 Device: Room Air ?O2 Flow Rate (L/min): 5 L/min ?FiO2 (%): 28 % ? ? ? ?Therapy/Group: Individual Therapy ? ?[name]?03/26/2022, 5:36 PM  ?

## 2022-03-26 NOTE — Progress Notes (Signed)
Speech Language Pathology Daily Session Note ? ?Patient Details  ?Name: Todd Estrada ?MRN: [id number] ?Date of Birth: 04-Oct-1961 ? ?Today's Date: 03/26/2022 ?SLP Individual Time: 5427-0623 ?SLP Individual Time Calculation (min): 30 min ? ?Short Term Goals: ?Week 1: SLP Short Term Goal 1 (Week 1): Patient will demonstrate improved alertness in order to functionally participate in further cognitive-linguistic/language assessment. ?SLP Short Term Goal 2 (Week 1): Patient will respond to yes/no questions via multimodal communication with 50% accuracy in regards to biographical and environmental information with max A verbal/visual cues ?SLP Short Term Goal 3 (Week 1): Patient maintain arousal and in order to tolerate PMSV as evidenced by SpO2>92% for 15 minute duration. ?SLP Short Term Goal 4 (Week 1): Patient will consume therapeutic PO trials following oral care with mod A cues for implementation of safe swallowing precautions and strategies ?SLP Short Term Goal 5 (Week 1): Patient will be oriented to person/place/time/situation with max A multimodal cues ?SLP Short Term Goal 6 (Week 1): Pt will verbalize basic wants/needs via multimodal communication and max A cues ? ?Skilled Therapeutic Interventions: ?Pt seen this date for skilled ST intervention targeting communication goals outlined above. Pt encountered lethargic and unable to maintain functional alertness/arousal for >5 seconds at a time. O2 saturation noted between 89-88%; LPN notified and tracheal suctioning provided by LPN who then notified RT. Secretions were noted to be thick and sticky in consistency. Pt recovered to 92% following tracheal suctioning; however, HR elevated to 100 and SpO2 readings continued between 92 and 90%. Therefore, PMSV was not placed this session. Pt nodded head to indicate agreement to ST intervention.   ? ?SLP provided Total A for oral care due to somnolence. Total A for orientation to location, situation, and date; demonstrated  orientation to name via yes/no questions. Followed one-step commands inconsistently with Max multimodal cues. Total A to produce functional hand gestures for indicated likes and dislikes. Answered biographical and environmental yes/no questions via head nod with <20% accuracy given Max A; only replied "yes" to questions. SpO2 readings dropped again to 89 and 88% with pt unable to produce volitional cough to clear secretions. Due to ongoing somnolence despite repositioning, oral care, and verbal + tactile stimulation, pt missed 15 minutes of scheduled therapy time.  LPN notified of drop in O2 saturation. ? ?Session concluded with pt in bed, bed alarm on, b/l wrist restraints and L hand mitten donned. Direct hand off to LPN. Continue per current ST POC. ? ?Pain ?Pt unable to confirm or endorse pain. No signs of discomfort or distress.  ? ?Therapy/Group: Individual Therapy ? ?[name] A Starlee Corralejo ?03/26/2022, 1:39 PM ?

## 2022-03-26 NOTE — Progress Notes (Signed)
RN called d/t pt desat 87-89% on room air trach collar, per RN he sx pt x 2 prior to me coming.  P. Ox probe change, sat still reading 87-89% on room air.  Pt placed on 28% ATC, sat now 94-95%.  Pt sx x 2 for mod thick tan secretions.  No distress noted currently.  RN is aware.   ?

## 2022-03-27 DIAGNOSIS — I63512 Cerebral infarction due to unspecified occlusion or stenosis of left middle cerebral artery: Secondary | ICD-10-CM | POA: Diagnosis not present

## 2022-03-27 NOTE — Progress Notes (Signed)
?                                                       PROGRESS NOTE ? ? ?Subjective/Complaints: ?No new complaints this morning ?Somnolent ?Appreciate ortho tech delivering abdominal binder ?No issues reported overnight ? ?ROS: +secretions, +lethargy as per therapy ? ?Objective: ?  ?VAS [place] ABI WITH/WO TBI ? ?Result Date: 03/25/2022 ? LOWER EXTREMITY DOPPLER STUDY Patient Name:  Todd Estrada  Date of Exam:   03/25/2022 Medical Rec #: [id number]      Accession #:    [id number] Date of Birth: 06-11-1961      Patient Gender: M Patient Age:   61 years Exam Location:  [facility] Procedure:      VAS [place] ABI WITH/WO TBI Referring Phys: Risa Grill --------------------------------------------------------------------------------  Indications: Peripheral artery disease. High Risk Factors: Hypertension.  Limitations: Today's exam was limited due to involuntary patient movement. Comparison Study: no prior Performing Technologist: Archie Patten RVS  Examination Guidelines: A complete evaluation includes at minimum, Doppler waveform signals and systolic blood pressure reading at the level of bilateral brachial, anterior tibial, and posterior tibial arteries, when vessel segments are accessible. Bilateral testing is considered an integral part of a complete examination. Photoelectric Plethysmograph (PPG) waveforms and toe systolic pressure readings are included as required and additional duplex testing as needed. Limited examinations for reoccurring indications may be performed as noted.  ABI Findings: +---------+------------------+-----+----------+--------------------------------+ Right    Rt Pressure (mmHg)IndexWaveform  Comment                          +---------+------------------+-----+----------+--------------------------------+ Brachial 125                    triphasic                                  +---------+------------------+-----+----------+--------------------------------+ PTA      61                 0.49 monophasic                                 +---------+------------------+-----+----------+--------------------------------+ DP       64                0.51 monophasic                                 +---------+------------------+-----+----------+--------------------------------+ Great Toe                                 unable to obtain tbi due to                                                patient movement                 +---------+------------------+-----+----------+--------------------------------+ +---------+------------------+-----+-------------------+-----------------------+ Left     Lt Pressure (mmHg)IndexWaveform  Comment                 +---------+------------------+-----+-------------------+-----------------------+ Brachial 114                    triphasic                                  +---------+------------------+-----+-------------------+-----------------------+ PTA      51                0.41 monophasic                                 +---------+------------------+-----+-------------------+-----------------------+ DP       41                0.33 dampened monophasic                        +---------+------------------+-----+-------------------+-----------------------+ Great Toe                                          unable to obtain tbi                                                       due to patient movement +---------+------------------+-----+-------------------+-----------------------+ +-------+-----------+-----------+------------+------------+ ABI/TBIToday's ABIToday's TBIPrevious ABIPrevious TBI +-------+-----------+-----------+------------+------------+ Right  0.51                                           +-------+-----------+-----------+------------+------------+ Left   0.41                                           +-------+-----------+-----------+------------+------------+  Summary:  Right: Resting right ankle-brachial index indicates moderate right lower extremity arterial disease. Left: Resting left ankle-brachial index indicates severe left lower extremity arterial disease. *See table(s) above for measurements and observations.  Electronically signed by Orlie Pollen on 03/25/2022 at 3:30:47 PM.    Final    ?Recent Labs  ?  03/26/22 ?1427  ?WBC 9.0  ?HGB 15.0  ?HCT 45.5  ?PLT 321  ? ?No results for input(s): NA, K, CL, CO2, GLUCOSE, BUN, CREATININE, CALCIUM in the last 72 hours. ? ?Intake/Output Summary (Last 24 hours) at 03/27/2022 1000 ?Last data filed at 03/27/2022 1610 ?Gross per 24 hour  ?Intake 956 ml  ?Output 1400 ml  ?Net -444 ml  ?  ? ?Pressure Injury 03/22/22 Heel Right;Lateral Deep Tissue Pressure Injury - Purple or maroon localized area of discolored intact skin or blood-filled blister due to damage of underlying soft tissue from pressure and/or shear. (Active)  ?03/22/22 1500  ?Location: Heel  ?Location Orientation: Right;Lateral  ?Staging: Deep Tissue Pressure Injury - Purple or maroon localized area of discolored intact skin or blood-filled blister due to damage of underlying soft tissue from pressure and/or shear.  ?Wound Description (Comments):   ?Present on Admission: Yes  ? ? ?Physical  Exam: ?Vital Signs ?Blood pressure 98/73, pulse 92, temperature 97.7 ?F (36.5 ?C), temperature source Oral, resp. rate 18, weight 78.2 kg, SpO2 94 %. ?Gen: no distress, lethargic ?HEENT: oral mucosa pink and moist, NCAT ?Cardio: Reg rate ?Chest: normal effort, normal rate of breathing ?Abd: soft, non-distended ?Ext: no edema ?Psych: somnolent ?Skin: ?PEG site LUQ, no drainage or tenderness  ?Extremities: No clubbing, cyanosis, or edema, IV site L arm CDI  ?Skin: No evidence of breakdown, no evidence of rash ?Neurologic: Cranial nerves II through XII intact, motor strength is 4/5 in Left , 0/5 RIght deltoid, bicep, tricep, grip, 4/5 Left and 2/5 Ri proximal hip flexor, knee extensors, ankle  dorsiflexor and plantar flexor ? ?Musculoskeletal: Full range of motion in all 4 extremities. No joint swelling ? ? ?Assessment/Plan: ?1. Functional deficits which require 3+ hours per day of interdisciplinary therapy in a comprehensive inpatient rehab setting. ?Physiatrist is providing close team supervision and 24 hour management of active medical problems listed below. ?Physiatrist and rehab team continue to assess barriers to discharge/monitor patient progress toward functional and medical goals ? ?Care Tool: ? ?Bathing ?   ?Body parts bathed by patient: Face  ?   ?  ?  ?Bathing assist Assist Level: Maximal Assistance - Patient 24 - 49% (Per OT eval note for bathing both the upper and lower body parts) ?  ?  ?Upper Body Dressing/Undressing ?Upper body dressing   ?What is the patient wearing?: [place] only ?   ?Upper body assist Assist Level: Total Assistance - Patient < 25% ?   ?Lower Body Dressing/Undressing ?Lower body dressing ? ? ?   ?What is the patient wearing?: Pants ? ?  ? ?Lower body assist Assist for lower body dressing: Total Assistance - Patient < 25% ?   ? ?Toileting ?Toileting    ?Toileting assist Assist for toileting: Dependent - Patient 0% ?  ?  ?Transfers ?Chair/bed transfer ? ?Transfers assist ?   ? ?Chair/bed transfer assist level: Dependent - mechanical lift (stedy) ?  ?  ?Locomotion ?Ambulation ? ? ?Ambulation assist ? ?   ? ?Assist level: 2 helpers ?Assistive device: Other (comment) ?Max distance: 10  ? ?Walk 10 feet activity ? ? ?Assist ?   ? ?Assist level: 2 helpers ?Assistive device: Other (comment)  ? ?Walk 50 feet activity ? ? ?Assist Walk 50 feet with 2 turns activity did not occur: Safety/medical concerns ? ?  ?   ? ? ?Walk 150 feet activity ? ? ?Assist Walk 150 feet activity did not occur: Safety/medical concerns ? ?  ?  ?  ? ?Walk 10 feet on uneven surface  ?activity ? ? ?Assist Walk 10 feet on uneven surfaces activity did not occur: Safety/medical concerns ? ? ?  ?    ? ?Wheelchair ? ? ? ? ?Assist Is the patient using a wheelchair?: Yes ?Type of Wheelchair: Manual (per PT note, TIS w/c) ?  ? ?Wheelchair assist level: Dependent - Patient 0% ?Max wheelchair distance: 150  ? ? ?Wheelchair 50 feet with 2 turns activity ? ? ? ?Assist ? ?  ?  ? ? ?Assist Level: Dependent - Pa

## 2022-03-27 NOTE — Progress Notes (Signed)
Orthopedic Tech Progress Note ?Patient Details:  ?Todd Estrada ?Apr 23, 1961 ?[id number] ? ?Ortho Devices ?Type of Ortho Device: Abdominal binder ?Ortho Device/Splint Interventions: Ordered ?  ? Dropped off in pts room for PT/OT. ? ?[name] ?03/27/2022, 7:48 AM ? ?

## 2022-03-28 ENCOUNTER — Inpatient Hospital Stay (HOSPITAL_COMMUNITY): Payer: Medicaid Other

## 2022-03-28 DIAGNOSIS — Z430 Encounter for attention to tracheostomy: Secondary | ICD-10-CM

## 2022-03-28 DIAGNOSIS — T17998A Other foreign object in respiratory tract, part unspecified causing other injury, initial encounter: Secondary | ICD-10-CM | POA: Diagnosis not present

## 2022-03-28 DIAGNOSIS — I63512 Cerebral infarction due to unspecified occlusion or stenosis of left middle cerebral artery: Secondary | ICD-10-CM | POA: Diagnosis not present

## 2022-03-28 LAB — CBC WITH DIFFERENTIAL/PLATELET
Abs Immature Granulocytes: 0.04 10*3/uL (ref 0.00–0.07)
Basophils Absolute: 0 10*3/uL (ref 0.0–0.1)
Basophils Relative: 0 %
Eosinophils Absolute: 0.3 10*3/uL (ref 0.0–0.5)
Eosinophils Relative: 3 %
HCT: 43.5 % (ref 39.0–52.0)
Hemoglobin: 14.6 g/dL (ref 13.0–17.0)
Immature Granulocytes: 0 %
Lymphocytes Relative: 17 %
Lymphs Abs: 1.7 10*3/uL (ref 0.7–4.0)
MCH: 30.4 pg (ref 26.0–34.0)
MCHC: 33.6 g/dL (ref 30.0–36.0)
MCV: 90.6 fL (ref 80.0–100.0)
Monocytes Absolute: 1.1 10*3/uL — ABNORMAL HIGH (ref 0.1–1.0)
Monocytes Relative: 11 %
Neutro Abs: 6.9 10*3/uL (ref 1.7–7.7)
Neutrophils Relative %: 69 %
Platelets: 269 10*3/uL (ref 150–400)
RBC: 4.8 MIL/uL (ref 4.22–5.81)
RDW: 13.2 % (ref 11.5–15.5)
WBC: 10 10*3/uL (ref 4.0–10.5)
nRBC: 0 % (ref 0.0–0.2)

## 2022-03-28 LAB — BLOOD GAS, ARTERIAL
Acid-Base Excess: 7.2 mmol/L — ABNORMAL HIGH (ref 0.0–2.0)
Bicarbonate: 31.2 mmol/L — ABNORMAL HIGH (ref 20.0–28.0)
Drawn by: 24610
O2 Saturation: 99.4 %
Patient temperature: 37
pCO2 arterial: 41 mmHg (ref 32–48)
pH, Arterial: 7.49 — ABNORMAL HIGH (ref 7.35–7.45)
pO2, Arterial: 87 mmHg (ref 83–108)

## 2022-03-28 LAB — BASIC METABOLIC PANEL
Anion gap: 8 (ref 5–15)
BUN: 27 mg/dL — ABNORMAL HIGH (ref 8–23)
CO2: 29 mmol/L (ref 22–32)
Calcium: 9 mg/dL (ref 8.9–10.3)
Chloride: 98 mmol/L (ref 98–111)
Creatinine, Ser: 0.85 mg/dL (ref 0.61–1.24)
GFR, Estimated: >60 mL/min (ref >60)
Glucose, Bld: 114 mg/dL — ABNORMAL HIGH (ref 70–99)
Potassium: 4.8 mmol/L (ref 3.5–5.1)
Sodium: 135 mmol/L (ref 135–145)

## 2022-03-28 LAB — URINE CULTURE: Culture: >=100000 — AB

## 2022-03-28 IMAGING — DX DG CHEST 1V PORT
1 series · 1 of 1 positions shown · non-contrast
Comparison: [DATE]

CLINICAL DATA: Dyspnea

EXAM:
PORTABLE CHEST 1 VIEW

[chest]
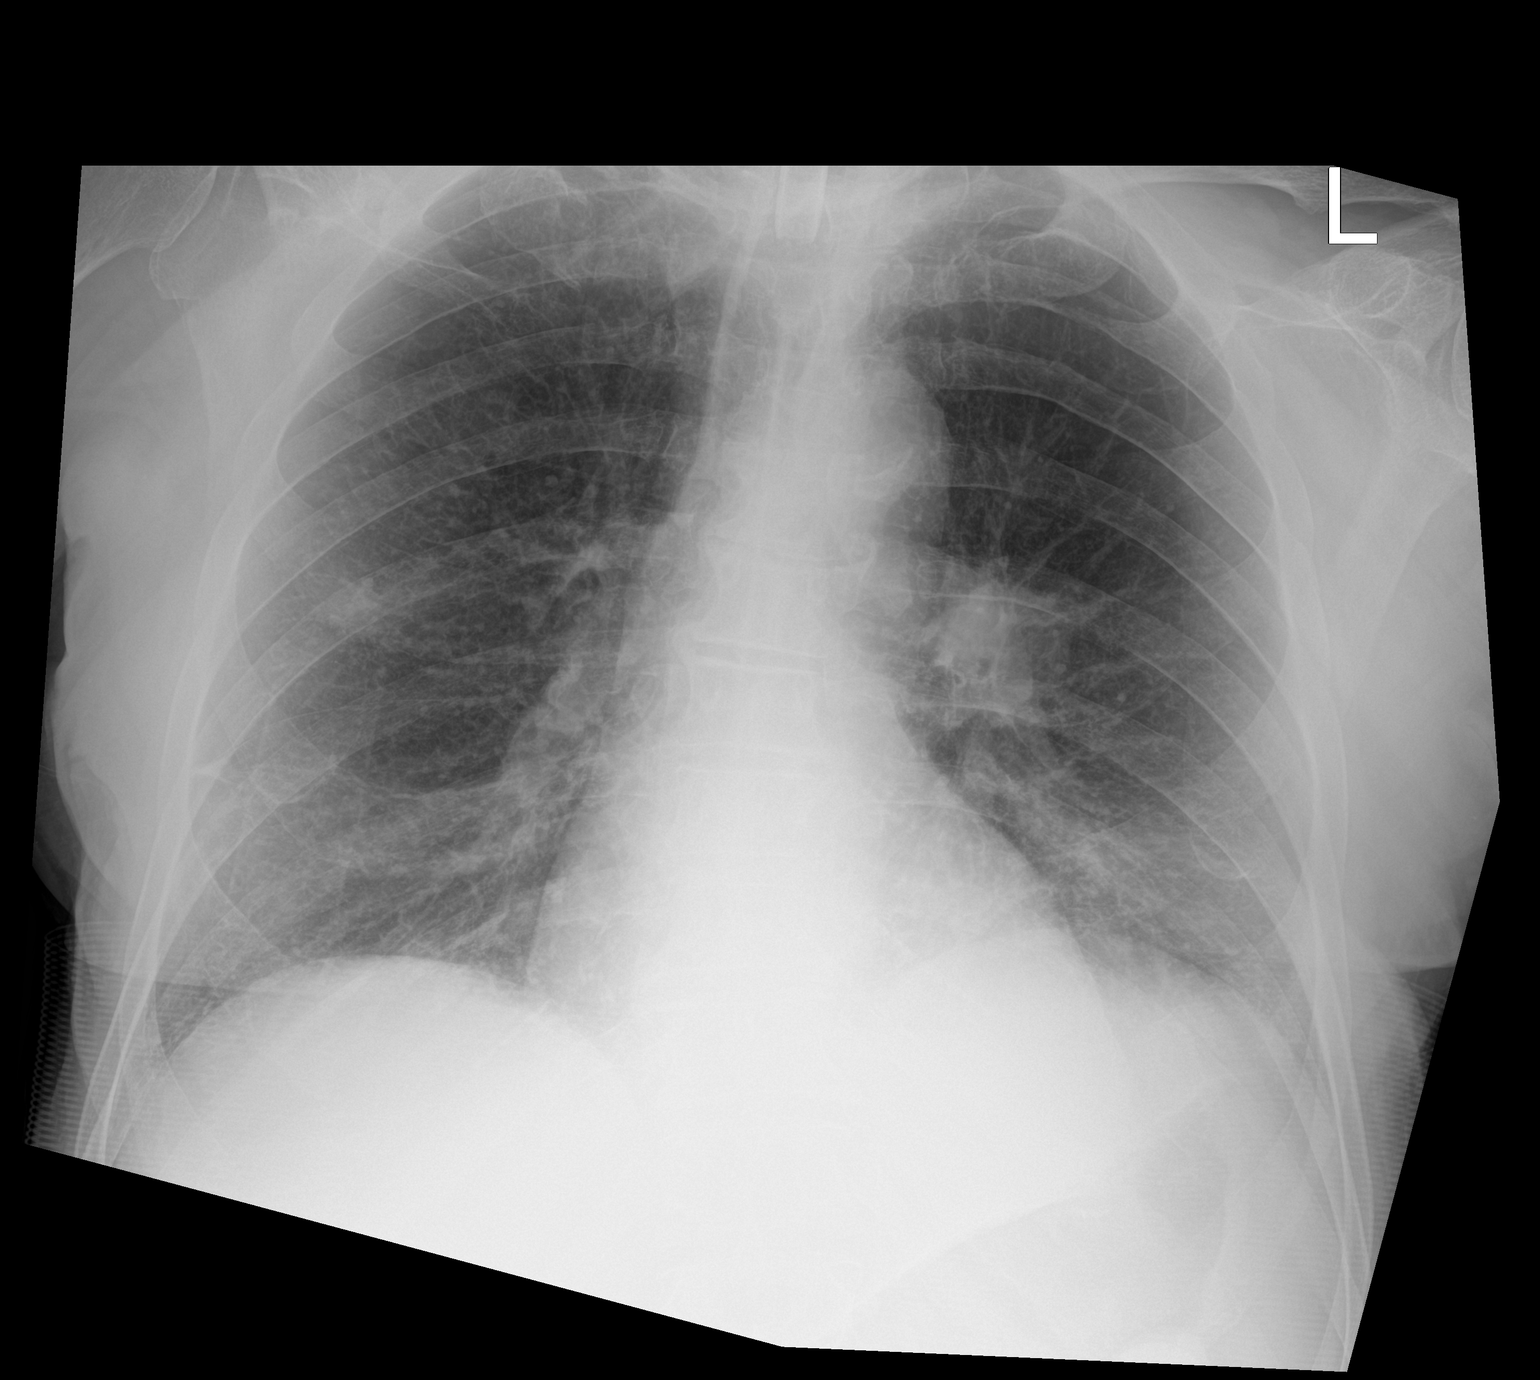

[1 of 1 positions shown; findings below may reference images not displayed]

FINDINGS: Emphysema. Left perihilar mass and right lung nodules are again
identified. Left basilar atelectasis/consolidation. No pleural
effusion or pneumothorax. Similar cardiomediastinal contours.
IMPRESSION: Similar lung aeration with left perihilar mass, right lung nodules,
and left basilar atelectasis/consolidation.

## 2022-03-28 NOTE — Progress Notes (Addendum)
Discussed clinical course this morning with Dr. Letta Pate and pulmonology. CXR and ABGs are pending. Pulmonology service with assess. ? ?ABGs: pCO2 41, pH 7.49, pO2 87 ?Chest xray stable ? ?Reviewed meds. Not receiving sedating meds. Assessed patient at approximately 12 noon. He was awake and answering questions appropriately. ?

## 2022-03-28 NOTE — Progress Notes (Signed)
?                                                       PROGRESS NOTE ? ? ?Subjective/Complaints: ? ?Per Resp tx , pt desatting this am , O2 needs have increased , no increased secretions,  ? ?ROS: +secretions, +lethargy as per therapy ? ?Objective: ?  ?No results found. ?Recent Labs  ?  03/26/22 ?1427 03/28/22 ?0753  ?WBC 9.0 10.0  ?HGB 15.0 14.6  ?HCT 45.5 43.5  ?PLT 321 269  ? ? ?Recent Labs  ?  03/28/22 ?0753  ?NA 135  ?K 4.8  ?CL 98  ?CO2 29  ?GLUCOSE 114*  ?BUN 27*  ?CREATININE 0.85  ?CALCIUM 9.0  ? ? ?Intake/Output Summary (Last 24 hours) at 03/28/2022 1115 ?Last data filed at 03/28/2022 3149 ?Gross per 24 hour  ?Intake 1156 ml  ?Output 1000 ml  ?Net 156 ml  ? ?  ? ?Pressure Injury 03/22/22 Heel Right;Lateral Deep Tissue Pressure Injury - Purple or maroon localized area of discolored intact skin or blood-filled blister due to damage of underlying soft tissue from pressure and/or shear. (Active)  ?03/22/22 1500  ?Location: [place]  ?Location Orientation: Right;Lateral  ?Staging: Deep Tissue Pressure Injury - Purple or maroon localized area of discolored intact skin or blood-filled blister due to damage of underlying soft tissue from pressure and/or shear.  ?Wound Description (Comments):   ?Present on Admission: Yes  ? ? ?Physical Exam: ?Vital Signs ?Blood pressure 122/73, pulse 97, temperature 97.6 ?F (36.4 ?C), temperature source Oral, resp. rate 18, weight 77.9 kg, SpO2 91 %. ? ?General: No acute distress ?Mood and affect are appropriate ?Heart: Regular rate and rhythm no rubs murmurs or extra sounds ?Lungs: Clear to auscultation, breathing unlabored, no rales or wheezes ?Abdomen: Positive bowel sounds, soft nontender to palpation, nondistended ?Extremities: No clubbing, cyanosis, or edema ? ? ?Psych: somnolent ?Skin: ?PEG site LUQ, no drainage or tenderness  ?Extremities: No clubbing, cyanosis, or edema, IV site L arm CDI  ?Skin: No evidence of breakdown, no evidence of rash ?Neurologic: Cranial nerves II  through XII intact, motor strength is 4/5 in Left , 0/5 RIght deltoid, bicep, tricep, grip, 4/5 Left and 2/5 Ri proximal hip flexor, knee extensors, ankle dorsiflexor and plantar flexor ? ?Musculoskeletal: Full range of motion in all 4 extremities. No joint swelling ? ? ?Assessment/Plan: ?1. Functional deficits which require 3+ hours per day of interdisciplinary therapy in a comprehensive inpatient rehab setting. ?Physiatrist is providing close team supervision and 24 hour management of active medical problems listed below. ?Physiatrist and rehab team continue to assess barriers to discharge/monitor patient progress toward functional and medical goals ? ?Care Tool: ? ?Bathing ?   ?Body parts bathed by patient: Face  ?   ?  ?  ?Bathing assist Assist Level: Maximal Assistance - Patient 24 - 49% (Per OT eval note for bathing both the upper and lower body parts) ?  ?  ?Upper Body Dressing/Undressing ?Upper body dressing   ?What is the patient wearing?: [place] only ?   ?Upper body assist Assist Level: Total Assistance - Patient < 25% ?   ?Lower Body Dressing/Undressing ?Lower body dressing ? ? ?   ?What is the patient wearing?: Pants ? ?  ? ?Lower body assist Assist for lower body dressing: Total Assistance -  Patient < 25% ?   ? ?Toileting ?Toileting    ?Toileting assist Assist for toileting: Dependent - Patient 0% ?  ?  ?Transfers ?Chair/bed transfer ? ?Transfers assist ?   ? ?Chair/bed transfer assist level: Dependent - mechanical lift (stedy) ?  ?  ?Locomotion ?Ambulation ? ? ?Ambulation assist ? ?   ? ?Assist level: 2 helpers ?Assistive device: Other (comment) ?Max distance: 10  ? ?Walk 10 feet activity ? ? ?Assist ?   ? ?Assist level: 2 helpers ?Assistive device: Other (comment)  ? ?Walk 50 feet activity ? ? ?Assist Walk 50 feet with 2 turns activity did not occur: Safety/medical concerns ? ?  ?   ? ? ?Walk 150 feet activity ? ? ?Assist Walk 150 feet activity did not occur: Safety/medical concerns ? ?  ?  ?   ? ?Walk 10 feet on uneven surface  ?activity ? ? ?Assist Walk 10 feet on uneven surfaces activity did not occur: Safety/medical concerns ? ? ?  ?   ? ?Wheelchair ? ? ? ? ?Assist Is the patient using a wheelchair?: Yes ?Type of Wheelchair: Manual (per PT note, TIS w/c) ?  ? ?Wheelchair assist level: Dependent - Patient 0% ?Max wheelchair distance: 150  ? ? ?Wheelchair 50 feet with 2 turns activity ? ? ? ?Assist ? ?  ?  ? ? ?Assist Level: Dependent - Patient 0%  ? ?Wheelchair 150 feet activity  ? ? ? ?Assist ?   ? ? ?Assist Level: Dependent - Patient 0%  ? ?Blood pressure 122/73, pulse 97, temperature 97.6 ?F (36.4 ?C), temperature source Oral, resp. rate 18, weight 77.9 kg, SpO2 91 %. ? ?Medical Problem List and Plan: ?1. Functional deficits secondary to left MCA stroke with right-sided weakness, slurred speech and expressive aphasia on admission ?            -patient may  shower with PMV and PEG ?            -ELOS/Goals: 2.5 to 3 weeks- min A to supervision ? Continue CIR ?2.  Antithrombotics: ?-DVT/anticoagulation:  Pharmaceutical: Lovenox ?            -antiplatelet therapy: Plavix and aspirin ?3. Pain Management: Tylenol prn ?4. Mood: LCSW to evaluate and provide emotional support ?            -antipsychotic agents: Seroquel 25 mg q HS ?            --Ativan prn agitation; continue restraints for now ?5. Neuropsych: This patient is not capable of making decisions on his own behalf. ?6. Skin/Wound Care: Routine skin care checks ?            --known pressure injury versus bruise>>? Left buttock ?            --dry, ischemic ulcers bilateral toes ?7. Fluids/Electrolytes/Nutrition: Routine Is and Os and follow-up chemistries ?            --NPO ?            --on continuous TFs at 55 cc/hr>>ask dietary to assist with conversion to TF bouses ?            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            --on Plavix and aspirin ?9: CHF: "eventually resume metoprolol 12.5 mg  BID", does not appear to be in fluid overload clinically but in DDx ,  receiving ~1131ml of fluid per day  ?10: Hypertension: continue TID monitoring ?Vitals:  ? 03/28/22 0859 03/28/22 1045  ?BP:    ?Pulse: 86 97  ?Resp: 18 18  ?Temp:    ?SpO2: 92% 91%  ?  ?11: Hyperlipidemia: continue Lipitor 40 mg daily ?12: Seizure-type activity: continue Keppra 500 mg BID ?13: Alcohol abuse: provide cessation counseling- ETOH neg on admission  ?            --continue thiamine supplementation ?14: Trach/Aspiration pneumonia and multiple intubation/extubation for airway protection ?            --s/p tracheostomy 3/22>>continue Robinul (? discontinue for dry tongue) ?            --now with cuffless Shiley #6 with humidified air - using PMV ?            --follow-up outpatient tach clinic ?15:Dysphagia ?--s/p PEG 3/24- is NPO- needs abd binder ot protect PEG, changing to TF boluses ?16: COPD: continue Candiss Norse and Pulmicort ?            -CHeck ABG today as we are increasing O2  ?17: Tobacco abuse: continue nicotine patch ?            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 PET CT examination is recommended ?for further evaluation. Associated left hilar pathologic adenopathy. ?20: Transaminitis: follow-up CMP tomorrow ?21: Left MCA stroke 08/2021 with petechial hemorrhage on aspirin monotherapy ?22. Loose stools- due to Colace? Suggest bulking up stools at this point.  ? 23.  Leukocytosis post PNA- cont to monitor - remains afebrile but at risk for UTI or PNA  ? 4/8: repeat CBC  ? 4/9: WBC reviewed and has normalized after first Keflex dose administered 4/8 ?Posible bronchogenic neoplasm which can also potentially elevate WBCs  ?Will Check UA, will hold off on CXR since 4/5 exam showed no evidence of PNA  ? 4/8: UA and UC ordered ? 4/9: Keflex started  on 4/8 for >100,000 E coli in UC, is sens to Keflex  ? ?  Latest Ref Rng & Units 03/28/2022  ?  7:53 AM 03/26/2022  ?  2:27 PM 03/23/2022  ?  5:16 AM  ?CBC  ?WBC 4.0 - 10.5 K/uL 10.0   9.0   14.9    ?Hemoglobin 13

## 2022-03-28 NOTE — Progress Notes (Addendum)
Speech Language Pathology Daily Session Note ? ?Patient Details  ?Name: Todd Estrada ?MRN: [id number] ?Date of Birth: 12-19-1961 ? ?Today's Date: 03/28/2022 ?SLP Individual Time: 1572-6203 ?SLP Individual Time Calculation (min): 30 min ? ?Short Term Goals: ?Week 1: SLP Short Term Goal 1 (Week 1): Patient will demonstrate improved alertness in order to functionally participate in further cognitive-linguistic/language assessment. ?SLP Short Term Goal 2 (Week 1): Patient will respond to yes/no questions via multimodal communication with 50% accuracy in regards to biographical and environmental information with max A verbal/visual cues ?SLP Short Term Goal 3 (Week 1): Patient maintain arousal and in order to tolerate PMSV as evidenced by SpO2>92% for 15 minute duration. ?SLP Short Term Goal 4 (Week 1): Patient will consume therapeutic PO trials following oral care with mod A cues for implementation of safe swallowing precautions and strategies ?SLP Short Term Goal 5 (Week 1): Patient will be oriented to person/place/time/situation with max A multimodal cues ?SLP Short Term Goal 6 (Week 1): Pt will verbalize basic wants/needs via multimodal communication and max A cues ? ?Skilled Therapeutic Interventions: ?Pt seen this date for skilled ST intervention targeting communication goals outlined above. Pt encountered lethargic, with intermittent eye closing. Flat affect. Lying reclined in bed. VSS per telemetry. No frank secretions noted around outer cannula. Agreeable to ST intervention at bedside. ? ?Max A provided for completion of oral care via suction toothbrush. PMSV donned by SLP following education re: purpose and benefits of speaking valve. Pt exhibited reflexive cough in response to placement; no expectoration of secretions. Following 3 minutes, pt's SpO2 remained at 90% and was unable to come back up to his baseline reading of 94-93%. Vocalized with PMSV donned, though grossly unintelligible and unable to perform  automatic speech tasks. Speaking valve doffed for the remainder of the session due to continued low 90 SpO2 reading. With removal, SpO2 returned to baseline. No back pressure nor change in RR noted. LPN reports RT provided breathing treatment this AM and tolerated placement with PT. Will continue to trial PMSV as able. ? ?Additionally, SLP targeted communication goals by asking yes/no questions and selection of answers with use of finger system "1" and "2" to begin establishing a functional, multimodal communication system. Pt responded to biographical and environmental yes/no questions with ~70% accuracy given overall Mod verbal and visual A to maintain focused attention and initiate head gestures. Answered questions re: address, name, environment given 2 verbal choices via fingers (e.g. Do you live in 1 = [place], 2 = [place]). Pt correctly answered with above mentioned communication system on ~80% of trials given overall Max verbal, visual, and tactile A for initiation, attention, and recall. Pt unable to point to items in room to indicate wants/needs, though indicated preferences with implementation of yes/no and choice prompts with fingers by the end of today's session. LPN notified of communication system. ? ?Session concluded with pt in bed, bed alarm on, call bell within reach, and all immediate needs met. Continue per current ST POC. ? ?Pain ?Pt shook head in agreement to pain; unable to show location. LPN present and aware. ? ?Therapy/Group: Individual Therapy ? ?Hollyanne Schloesser A Shaela Boer ?03/28/2022, 12:09 PM ?

## 2022-03-28 NOTE — Progress Notes (Signed)
Physical Therapy Session Note ? ?Patient Details  ?Name: Todd Estrada ?MRN: [id number] ?Date of Birth: [birth year] ? ?Today's Date: 03/28/2022 ?PT Individual Time: 6979-4801 ?PT Individual Time Calculation (min): 47 min  ? ?Short Term Goals: ?Week 1:  PT Short Term Goal 1 (Week 1): pt will trasnfer to [facility] with min assist ?PT Short Term Goal 2 (Week 1): Pt will consistently perform bed mobility with min assist ?PT Short Term Goal 3 (Week 1): Pt will ambulate 76ft with mod A +2 ? ?Skilled Therapeutic Interventions/Progress Updates:  ?Patient supine asleep in bed on entrance to room. Patient alert and initially not responsive to requests to transition to sitting EOB. Several attempts to wake, prevalon boot removed, face wiped with warm washcloth and little change in responsiveness. Disc with nursing case manager and therapy supervisor re: pt's decreased desire to participate and questioning pt's ability to participate in 3 hrs of therapy each day. Disc on adjusting schedule to 15/ 7 vs QD until placement can be found.  ? ?Return to room for second attempt to wake pt. Pt wakes and is amenable to PT session with max encouragement. Wrist restraints and L hand mitt removed. Pt does not reach for any lines this session.  ? ?Patient with no pain complaint throughout session. ? ?Therapeutic Activity: ?Bed Mobility: Patient performed supine <> sit with CGA. No cueing provided as pt rises to sit EOB without warning. Return to supine with supervision and pt guided in reaching hooklying position in order to assist with move toward [facility] with [place].  ?Transfers: Patient performed sit<>stand transfers throughout session with MinA for attaining upright balance. Pt also guided in lateral scoot along EOB. Requires ModA initially for desired movement, then completes scoot to [facility] with MinA for lateral repositioning. Pt able to initiate rise from bed with CGA. Provided verbal cues for forward lean and technique. ? ?Neuromuscular Re-ed: ?NMR  facilitated during session with focus on sitting and standing balance. Pt guided in sitting tolerance and balance activity with min perturbations to balance as well as to form therapeutic alliance with light pressure facilitation of effleurage and petrissage to skin on pt's back. NMR performed for improvements in motor control and coordination, balance, sequencing, judgement, and self confidence/ efficacy in performing all aspects of mobility at highest level of independence.  ? ? ?Patient supine  in bed at end of session with brakes locked, bed alarm set, wrist restraints and L hand mitt donned, and all needs within reach. ?   ?Request sent to MD for adjustment to 15/7 d/t lethargy, difficulty participating in sessions with variable focus. MD open to change and submits order.  ? ?Therapy Documentation ?Precautions:  ?Precautions ?Precautions: Fall ?Precaution Comments: posey belt, Bil mitts, R inattention, trach ?Restrictions ?Weight Bearing Restrictions: No ?General: ?PT Amount of Missed Time (min): 13 Minutes ?PT Missed Treatment Reason: Patient unwilling to participate;Patient fatigue ?Vital Signs: ?Therapy Vitals ?Pulse Rate: 86 ?Resp: 18 ?Patient Position (if appropriate): Lying ?Oxygen Therapy ?SpO2: 92 % ?O2 Device: Tracheostomy Collar ?O2 Flow Rate (L/min): 8 L/min ?FiO2 (%): 35 % ?Pain: ? No indication of pain but discomfort noted with vision of R eye.  ? ?Therapy/Group: Individual Therapy ? ?Alger Simons PT, DPT ?03/28/2022, 10:13 AM  ?

## 2022-03-28 NOTE — Discharge Summary (Signed)
Physician Discharge Summary  ?Patient ID: ?Todd Estrada ?MRN: [id number] ?DOB/AGE: [birth year] 61 y.o. ? ?Admit date: 03/22/2022 ?Discharge date: 04/15/2022 ? ?Discharge Diagnoses:  ?Principal Problem: ?  Acute ischemic left MCA stroke ([place]) ?Active Problems: ?  Chronic respiratory failure with hypoxia (H[facility]) ?  Dyslipidemia ?  Acute lower UTI ?  Dysphagia, post-stroke ?  Emphysema lung ([place]) ?Active problems: ?Functional deficits secondary to left MCA stroke ?Peripheral arterial disease ?Tracheostomy ?Hypertension ?Hyperlipidemia ?Seizure type activity ?Alcohol abuse ?Dysphagia ?Status post PEG placement ?COPD ?Tobacco abuse ?Lung mass ?Hypoxia ? ?Discharged Condition: stable ? ?Significant Diagnostic Studies: ?CT CHEST W CONTRAST ? ?Result Date: 03/18/2022 ?CLINICAL DATA:  Abnormal xray - lung nodule, < 1 cm, low risk EXAM: CT CHEST WITH CONTRAST TECHNIQUE: Multidetector CT imaging of the chest was performed during intravenous contrast administration. RADIATION DOSE REDUCTION: This exam was performed according to the departmental dose-optimization program which includes automated exposure control, adjustment of the mA and/or kV according to patient size and/or use of iterative reconstruction technique. CONTRAST:  8mL OMNIPAQUE IOHEXOL 300 MG/ML  SOLN COMPARISON:  Chest radiograph 03/17/2022 FINDINGS: Cardiovascular: Extensive coronary artery calcification. Global cardiac size within normal limits. No pericardial effusion. Central pulmonary arteries are of normal caliber. Mild predominantly atheromatous plaque within the descending thoracic aorta. No aortic aneurysm. Mediastinum/Nodes: Tracheostomy in expected position. Necrotic left hilar lymph node noted measuring 14 mm in short axis diameter, axial image # 79/3. No additional frankly pathologic thoracic adenopathy is identified though several shotty aortopulmonary and subcarinal lymph nodes noted. The esophagus is unremarkable., Lungs/Pleura: There is moderate to  severe emphysema. Left basilar atelectasis or parenchymal scarring is present. Lobulated, spiculated mass is seen within the left upper lobe suspicious for a primary bronchogenic neoplasm measuring 2.7 x 3.7 cm at axial image # 76/4. An additional lobulated, spiculated nodule is seen within the posterior segment of the right upper lobe measuring 16 x 20 mm at axial image # 85/4 most in keeping with a synchronous primary neoplasm. Mean 13 mm rounded nonspecific nodule is seen within the left lower lobe, axial image # 114/4. Tubular opacity within the subpleural right lower lobe at axial image # 127/4 is most in keeping with an impacted airway. No pneumothorax or pleural effusion. Upper Abdomen: Gastrostomy catheter is partially visualized. No acute abnormality. Musculoskeletal: No lytic or blastic bone lesions. No acute bone abnormality. IMPRESSION: Bilateral upper lobe lobulated/spiculated masses suspicious for synchronous bronchogenic neoplasm. PET CT examination is recommended for further evaluation. Associated left hilar pathologic adenopathy. 13 mm indeterminate rounded nodule within the left lower lobe. Further follow-up will be dictated by surveillance imaging for above-mentioned pulmonary masses. Moderate to severe emphysema. Extensive coronary artery calcification. Aortic Atherosclerosis (ICD10-I70.0) and Emphysema (ICD10-J43.9). Electronically Signed   By: Fidela Salisbury M.D.   On: 03/18/2022 18:06  ? ?DG CHEST PORT 1 VIEW ? ?Result Date: 03/28/2022 ?CLINICAL DATA:  Dyspnea EXAM: PORTABLE CHEST 1 VIEW COMPARISON:  03/23/2022 FINDINGS: Emphysema. Left perihilar mass and right lung nodules are again identified. Left basilar atelectasis/consolidation. No pleural effusion or pneumothorax. Similar cardiomediastinal contours. IMPRESSION: Similar lung aeration with left perihilar mass, right lung nodules, and left basilar atelectasis/consolidation. Electronically Signed   By: Macy Mis M.D.   On: 03/28/2022  15:12  ? ?DG CHEST PORT 1 VIEW ? 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 Hyperinflation with background emphysema. Left  upper lobe perihilar mass and right mid and lower lung nodules again noted better demonstrated by comparison chest CT. No enlarging effusion or pneumothorax. No new superimposed acute airspace process or pneumonia. Trachea midline.  Aorta atherosclerotic.  No acute osseous finding. IMPRESSION: Stable hyperinflation compatible with COPD/emphysema. Similar left perihilar mass and right lung nodules, again concerning for malignancy. Please refer to the recent chest CT. No superimposed acute chest process by plain radiography. Electronically Signed   By: Jerilynn Mages.  Shick M.D.   On: 03/23/2022 13:21  ? ?DG CHEST PORT 1 VIEW ? ?Result Date: 03/17/2022 ?CLINICAL DATA:  Seizures, pneumonia. EXAM: PORTABLE CHEST 1 VIEW COMPARISON:  March 09, 2022. FINDINGS: The heart size and mediastinal contours are within normal limits. Tracheostomy tube is in good position. Rounded left perihilar opacity is noted concerning for possible neoplasm. Possible rounded density is noted in right lower lobe is well. The visualized skeletal structures are unremarkable. IMPRESSION: Rounded left perihilar density is noted as well as small right basilar opacity. CT scan of the chest with Intravenous contrast is recommended to rule out pulmonary nodule or mass. Electronically Signed   By: Marijo Conception M.D.   On: 03/17/2022 08:13  ? ?DG Swallowing Func-Speech Pathology ? ?Result Date: 04/13/2022 ?Table formatting from the original result was not included. Objective Swallowing Evaluation: Type of Study: MBS-Modified Barium Swallow Study  Patient Details Name: Todd Estrada MRN: [id number] Date of Birth: Feb 11, 1961 Today's Date: 04/13/2022 Time: SLP Start Time (ACUTE  ONLY): 0945 -SLP Stop Time (ACUTE ONLY): 1008 SLP Time Calculation (min) (ACUTE ONLY): 23 min Past Medical History: Past Medical History: Diagnosis Date  Hypertension  Past Surgical History: Past Surgical History: Procedure Laterality Date  ESOPHAGOGASTRODUODENOSCOPY (EGD) WITH PROPOFOL N/A 03/11/2022  Procedure: ESOPHAGOGASTRODUODENOSCOPY (EGD) WITH PROPOFOL;  Surgeon: Georganna Skeans, MD;  Location: [place];  Service: General;  Laterality: N/A;  IR ANGIO INTRA EXTRACRAN SEL COM CAROTID INNOMINATE UNI R MOD SED  03/02/2022  IR CT HEAD LTD  03/02/2022  IR PERCUTANEOUS ART THROMBECTOMY/INFUSION INTRACRANIAL INC DIAG ANGIO  03/02/2022  PEG PLACEMENT N/A 03/11/2022  Procedure: PERCUTANEOUS ENDOSCOPIC GASTROSTOMY (PEG) PLACEMENT;  Surgeon: Georganna Skeans, MD;  Location: [place];  Service: General;  Laterality: N/A;  RADIOLOGY WITH ANESTHESIA N/A 03/02/2022  Procedure: IR WITH ANESTHESIA;  Surgeon: Luanne Bras, MD;  Location: [place];  Service: Radiology;  Laterality: N/A; HPI: 61 y/o male with a history of Left MCA stroke in 9562 with a complicated post stroke course presented on 3/15 with a 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  He was extubated following procedure, but reintubated due to respiratory failure.  Self extubated overnight 3/19, now on venturi mask.  Re-intubated on 3/21 and bronchoscopy and tracheostomy performed on 3/22. Prior CVA resulted in mild to moderate aphasia.  Subjective: Pt resting in bed, awake. Sonorous breathing sounds and wet vocal quality noted. No family present  Recommendations for follow up therapy are one component of a multi-disciplinary discharge planning process, led by the attending physician.  Recommendations may be updated based on patient status, additional functional criteria and insurance authorization. Assessment / Plan / Recommendation   04/13/2022   5:35 PM Clinical Impressions Clinical Impression Patient seen for repeat MBS. Results are  suggestive of improvement in oropharyngeal swallow function as compared to initial study obtained on 4/04. Pt currently presents with a mild oropharyngeal dysphagia. Oral deficits are characterized by dec

## 2022-03-28 NOTE — Progress Notes (Signed)
Speech Language Pathology Daily Session Note ? ?Patient Details  ?Name: Todd Estrada ?MRN: [id number] ?Date of Birth: [birth year] ? ?Today's Date: 03/28/2022 ?SLP Individual Time: 1000-1030 ?SLP Individual Time Calculation (min): 30 min and Today's Date: 03/28/2022 ?SLP Missed Time: 15 Minutes ?Missed Time Reason: Patient fatigue;Patient unwilling to participate ? ?Short Term Goals: ?Week 1: SLP Short Term Goal 1 (Week 1): Patient will demonstrate improved alertness in order to functionally participate in further cognitive-linguistic/language assessment. ?SLP Short Term Goal 2 (Week 1): Patient will respond to yes/no questions via multimodal communication with 50% accuracy in regards to biographical and environmental information with max A verbal/visual cues ?SLP Short Term Goal 3 (Week 1): Patient maintain arousal and in order to tolerate PMSV as evidenced by SpO2>92% for 15 minute duration. ?SLP Short Term Goal 4 (Week 1): Patient will consume therapeutic PO trials following oral care with mod A cues for implementation of safe swallowing precautions and strategies ?SLP Short Term Goal 5 (Week 1): Patient will be oriented to person/place/time/situation with max A multimodal cues ?SLP Short Term Goal 6 (Week 1): Pt will verbalize basic wants/needs via multimodal communication and max A cues ? ?Skilled Therapeutic Interventions: Skilled treatment session focused on communication goals. Upon arrival, patient appeared lethargic with O2 saturations ~90-91% and HR fluctuating between 76-96 bpm. SLP applied PMSV. Patient's vitals remained stable but his HR continued to fluctuate greatly. Nursing made aware and RT was called. RT provided deep suctioning and removed a large amount of thick secretions but O2 remained around 90%. Therefore, RT increased the patient's FiO2 to 40% with O2 improving to 96%. Educated patient on goals of session such as providing oral care and trials of thickened liquids/puree. Patient consistently  shaking his head "no" and declining participation. Therefore, patient missed remaining 15 minutes of session. Patient left upright in bed with alarm on and all needs within reach. Continue with current plan of care.  ?   ? ?Pain ?No/Denies Pain  ? ?Therapy/Group: Individual Therapy ? ?Todd Estrada ?03/28/2022, 3:24 PM ?

## 2022-03-28 NOTE — Progress Notes (Signed)
Noted p.ox alarm beeping, noted sats 86% on 40% ATC.  Pt is awake and in no distress.  Fio2 increased to 60%, sat now 92%.  RN notified and MD came to bedside to assess patient.   ?

## 2022-03-28 NOTE — Progress Notes (Signed)
Occupational Therapy Session Note ? ?Patient Details  ?Name: [name] ?MRN: [id number] ?Date of Birth: 1961/05/30 ? ?Today's Date: 03/28/2022 ?OT Missed Time: 60 Minutes ?Missed Time Reason: Patient fatigue;Other (comment) (lethargic/somnolent) ? ? ?Short Term Goals: ?Week 1:  OT Short Term Goal 1 (Week 1): Pt will complete 2 grooming tasks at sink with mod A. ?OT Short Term Goal 2 (Week 1): Pt will complete sit to stand at sink with mod A. ?OT Short Term Goal 3 (Week 1): Pt will don shirt with max A. ?OT Short Term Goal 4 (Week 1): Pt will transfer to toilet with max A and LRAD. ? ? ?Skilled Therapeutic Interventions/Progress Updates:  ?  Pt semi reclined in bed, direct hand off from nursing.  Pt nodding/shaking head "yes"/ "no" appropriately to simple question cues. "Yes" to nurse when asked if he had everything he needed.  Shaking head "no" when asked if he is in pain and also to completing any self care as well as sitting EOB.  Pt also shaking head "no" when encouraged to participate in BUE bed level therex.  Pt educated on importance of participating with OT to facilitate increased independence and overall health.  Pt falling asleep intermittently during OT attempts to encourage participation, and pt continuing to shake head "no", therefore 60 minutes of missed treatment.  ? ?Therapy Documentation ?Precautions:  ?Precautions ?Precautions: Fall ?Precaution Comments: posey belt, Bil mitts, R inattention, trach ?Restrictions ?Weight Bearing Restrictions: No ? ? ? ?Therapy/Group: Individual Therapy ? ?Caryl Asp Dene Nazir ?03/28/2022, 1:42 PM ?

## 2022-03-28 NOTE — Progress Notes (Signed)
? ?NAME:  Todd Estrada, MRN:  [id number], DOB:  1961/06/29, LOS: 6 ?ADMISSION DATE:  03/22/2022, CONSULTATION DATE:  3/16 ?REFERRING MD:  Erlinda Hong, CHIEF COMPLAINT:  Confusion, seizure  ? ?History of Present Illness:  ?61 y/o male with a history of Left MCA stroke in 6195 with a complicated post stroke course presented on 3/15 with a 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  Intubated on 3/16 for airway protection.  Transferred to rehab for 03/2022 ? ?Pertinent  Medical History  ?Hyeprtension ?Left MCA stroke ?Chronic combined CHF ? ?Significant Hospital Events: ?Including procedures, antibiotic start and stop dates in addition to other pertinent events   ?3/15 admitted for acute L MCA stroke due to L ICA occlusion with first time seizure s/p revascularization of L ICA occlusion ?3/16 Intubated for airway protection; started unasyn; echo LVEF 40-45%, RVSP normal ?3/17 MRI Brain showed acute infarct in left frontal, temporal and parietal lobes with findings worrisome for acute infarct, left vert occluded; CT head did not show hemorrhage; respiratory notes thick secretions,  ?3/18 following some commands on exam, extubated the re-intubated due to hypoxemia, inability to clear secretions ?3/19 self extubated overnight, remains on NRB ?3/21 required reintubation for inability to protect airway and aspiration  ?3/22 bedside perc tracheostomy with bronchoscopy  ?3/24 PEG ?3/26 started on trach collar ?03/17/2022 pulmonary critical care called to bedside for patient who has decannulate himself x2.  He is now restrained and most likely needs a sitter ?03/22/2022 transferred to rehab ?03/23/2022 pulmonary critical care consulted ?03/28/2022 Pulmonary Critical care asked to assess for increased oxygen needs ?Interim History / Subjective:  ?Called by rehab staff 4/10 am. Patient with increased secretions and increasing oxygen demands. He is more lethargic than his baseline. ?Rehab staff have asked for [place] to assess  for concern of worsening respiratory status.  ?He has increased oxygen from 35% TC to 60% TC overnight. ?CXR and ABG are pending ?? If he is having a COPD flare vs aspiration  ? ?Objective   ?Blood pressure 122/73, pulse 92, temperature 97.6 ?F (36.4 ?C), temperature source Oral, resp. rate 18, weight 77.9 kg, SpO2 92 %. ?   ?FiO2 (%):  [28 %-60 %] 60 %  ? ?Intake/Output Summary (Last 24 hours) at 03/28/2022 1149 ?Last data filed at 03/28/2022 0932 ?Gross per 24 hour  ?Intake 1156 ml  ?Output 1000 ml  ?Net 156 ml  ? ?Filed Weights  ? 03/26/22 0501 03/27/22 0500 03/28/22 0512  ?Weight: 77 kg 78.2 kg 77.9 kg  ? ? ?General: Elderly male, lethargic, but arouses to call of name ?HEENT: MM pink/moist #6 uncuffed tracheostomy is in place with increase in secretions at this time ?Neuro: Right-sided weakness but able to follow commands with both arms and legs and speech is understandable despite  being trached, he is lethargic ?CV: Heart sounds are regular, No RMG ?PULM: Bilateral chest excursion, Diminished in the bases, coarse with few rhonchi ?GI: soft, bsx4 active  ?Extremities: warm/dry, negative edema,  ?Skin: no rashes or lesions ? ? ? ?Resolved Hospital Problem list   ?Septic shock due to aspiration pneumonia> resolved ?Constipation ? ?Assessment & Plan:  ?Acute encephalopathy due to left MCA stroke from left ICA occlusion requiring thrombectomy and stent placement on 3/15 ?Seizure disorder ?Agitated delirium on 3/19> risk to self pulling at tubes, lines ?Per neuro ?Currently more awake interactive and cooperative, but more lethargic than his baseline ? ?Acute hypoxemic respiratory failure due to aspiration pneumonia s/p tracheostomy >  improving ?Thick pulmonary secretions ?Concern for emphysema/COPD > not in history but exam supports ?4/10 concern for increased oxygen needs and  ?Patient required intubation on 03/05/2021 by pulmonary critical care ?Subsequent tracheostomy 03/09/2022.  CCM following the tracheostomy and  while he was in the progressive care unit.  He had copious thick secretions that made decannulation not an option.  He was also near obtunded and could not protect his airway at this time.   ?He had improved, was doing well , but 4/10 has increased oxygen needs , increased secretions and increased lethargy. ? ?Plan ?CXR now ?ABG now ?Continue to use limited restraints ?We had started on low-dose Robinul prior to transfer to rehab and remains on 1 mg 2 times a day would continue Robinul at this time ?Monitor for questionable decannulation in near future since his neuro status has improved remarkably ?Oxygen as needed to maintain sats > 88% ?Bronco dilators as needed and ordered ?Nicotine patch is discontinued to give him a chance to be free of nicotine addiction ?Pulmonary critical care will see as needed while in rehab ? ? ?Combined systolic and diastolic heart failure, chronic ?Hypertension ?Per primary ? ?RUE thrombophlebitis ?Per primary ? ?Best Practice (right click and "Reselect all SmartList Selections" daily)  ?Per primary ?Diet/type: tubefeeds ?DVT prophylaxis: LMWH ?GI prophylaxis: PPI ?Lines: N/A ?Foley:  N/A ?Code Status:  full code ?Last date of multidisciplinary goals of care discussion [per stroke service] ? ?Labs   ?CBC: ?Recent Labs  ?Lab 03/23/22 ?3500 03/26/22 ?1427 03/28/22 ?0753  ?WBC 14.9* 9.0 10.0  ?NEUTROABS 11.6* 6.6 6.9  ?HGB 13.9 15.0 14.6  ?HCT 42.6 45.5 43.5  ?MCV 92.2 91.2 90.6  ?PLT 410* 321 269  ? ? ?Basic Metabolic Panel: ?Recent Labs  ?Lab 03/23/22 ?9381 03/28/22 ?0753  ?NA 135 135  ?K 4.2 4.8  ?CL 102 98  ?CO2 25 29  ?GLUCOSE 130* 114*  ?BUN 24* 27*  ?CREATININE 0.76 0.85  ?CALCIUM 8.6* 9.0  ? ?GFR: ?Estimated Creatinine Clearance: 100.2 mL/min (by C-G formula based on SCr of 0.85 mg/dL). ?Recent Labs  ?Lab 03/23/22 ?8299 03/26/22 ?1427 03/28/22 ?0753  ?WBC 14.9* 9.0 10.0  ? ? ?Liver Function Tests: ?Recent Labs  ?Lab 03/23/22 ?3716  ?AST 40  ?ALT 61*  ?ALKPHOS 93  ?BILITOT 0.3   ?PROT 6.6  ?ALBUMIN 2.5*  ? ?No results for input(s): LIPASE, AMYLASE in the last 168 hours. ?No results for input(s): AMMONIA in the last 168 hours. ? ?ABG ?   ?Component Value Date/Time  ? PHART 7.378 03/08/2022 1732  ? PCO2ART 55.9 (H) 03/08/2022 1732  ? PO2ART 98 03/08/2022 1732  ? HCO3 32.8 (H) 03/08/2022 1732  ? TCO2 34 (H) 03/08/2022 1732  ? O2SAT 97 03/08/2022 1732  ?  ? ?Coagulation Profile: ?No results for input(s): INR, PROTIME in the last 168 hours. ? ?Cardiac Enzymes: ?No results for input(s): CKTOTAL, CKMB, CKMBINDEX, TROPONINI in the last 168 hours. ? ?HbA1C: ?Hgb A1c MFr Bld  ?Date/Time Value Ref Range Status  ?03/03/2022 04:30 AM 5.4 4.8 - 5.6 % Final  ?  Comment:  ?  (NOTE) ?        Prediabetes: 5.7 - 6.4 ?        Diabetes: >6.4 ?        Glycemic control for adults with diabetes: <7.0 ?  ?08/19/2021 07:49 AM 5.5 4.8 - 5.6 % Final  ?  Comment:  ?  (NOTE) ?Pre diabetes:  5.7%-6.4% ? ?Diabetes:              >6.4% ? ?Glycemic control for   <7.0% ?adults with diabetes ?  ? ? ?CBG: ?Recent Labs  ?Lab 03/23/22 ?0358 03/23/22 ?1205 03/24/22 ?0001 03/24/22 ?0406 03/25/22 ?0021  ?GLUCAP 130* 146* 105* 105* 147*  ? ?Critical care time: na  ?  ?Magdalen Spatz, MSN, AGACNP-BC ?[facility] ?See Amion for personal pager ?PCCM on call pager [phone]  ?03/28/2022, 11:49 AM ? ? ? ? ? ? ?

## 2022-03-28 NOTE — Progress Notes (Addendum)
RN called d/t pt w/ desat 89-90% on ATC.  Pt was suctioned for large amount of white thick secretions.  Sat still 89-90% on 35% ATC.  Per RN, sat probe was changed this morning.  Fio2 was increased to 40%.  No distress noted, no increased WOB noted. RN in room and aware, per RN she is contacting MD.   ?

## 2022-03-29 DIAGNOSIS — J9611 Chronic respiratory failure with hypoxia: Secondary | ICD-10-CM

## 2022-03-29 DIAGNOSIS — I63512 Cerebral infarction due to unspecified occlusion or stenosis of left middle cerebral artery: Secondary | ICD-10-CM | POA: Diagnosis not present

## 2022-03-29 LAB — GLUCOSE, CAPILLARY: Glucose-Capillary: 118 mg/dL — ABNORMAL HIGH (ref 70–99)

## 2022-03-29 NOTE — Progress Notes (Signed)
Occupational Therapy Weekly Progress Note ? ?Patient Details  ?Name: Todd Estrada ?MRN: [id number] ?Date of Birth: [birth year] ? ?Beginning of progress report period: March 23, 2022 ?End of progress report period: March 29, 2022 ? ?Today's Date: 03/29/2022 ?OT Individual Time: 2637-8588 ?OT Individual Time Calculation (min): 61 min + 25 min  ? ? ?Patient has met 0 of 4 short term goals.  Pt has made slow progress this week towards LTG. Pt has demonstrated improved OOB activity this date as of 4/11 to remain in [place] for OOB therapy, but is ultimately total A for bed level ADL.  Pt cont to be primarily limited by lethargy/fatigued, poor participation in therapy, RUE hemiplegia. RUE and hand currently assessed at [place] level II and II respectively. Anticipate 24/7 S and mod to max physical assist for BADL required upon DC. No CG present thus far in therapy. Pt made 15/7 due to poor participation in therapy. ? ? ?Patient continues to demonstrate the following deficits: muscle weakness, decreased cardiorespiratoy endurance, impaired timing and sequencing, abnormal tone, unbalanced muscle activation, motor apraxia, decreased coordination, and decreased motor planning, decreased visual perceptual skills and decreased visual motor skills, decreased midline orientation, decreased attention to right, and decreased motor planning, decreased initiation, decreased attention, decreased awareness, decreased problem solving, decreased safety awareness, decreased memory, and delayed processing, and decreased sitting balance, decreased standing balance, decreased postural control, hemiplegia, and decreased balance strategies and therefore will continue to benefit from skilled OT intervention to enhance overall performance with BADL and Reduce care partner burden. ? ?Patient not progressing toward long term goals.  See goal revision..  Plan of care revisions: downgraded CGA/min A goals to mod / min A goals overall due to poor therapy  participation/slower than anticipated progress. See EMR for details. ? ?OT Short Term Goals ?Week 1:  OT Short Term Goal 1 (Week 1): Pt will complete 2 grooming tasks at sink with mod A. ?OT Short Term Goal 1 - Progress (Week 1): Not met ?OT Short Term Goal 2 (Week 1): Pt will complete sit to stand at sink with mod A. ?OT Short Term Goal 2 - Progress (Week 1): Not met ?OT Short Term Goal 3 (Week 1): Pt will don shirt with max A. ?OT Short Term Goal 3 - Progress (Week 1): Not met ?OT Short Term Goal 4 (Week 1): Pt will transfer to toilet with max A and LRAD. ?OT Short Term Goal 4 - Progress (Week 1): Not met ?Week 2:  OT Short Term Goal 1 (Week 2): Pt will complete 2 grooming tasks at sink with mod A. ?OT Short Term Goal 2 (Week 2): Pt will complete sit to stand at sink with mod A. ?OT Short Term Goal 3 (Week 2): Pt will don shirt with max A. ?OT Short Term Goal 4 (Week 2): Pt will transfer to toilet with max A and LRAD. ?OT Short Term Goal 5 (Week 2): Pt will participate in 30 min of OT session in 2/3 attemps. ? ?Skilled Therapeutic Interventions/Progress Updates:  ?  Session 1 [phone]) : Pt received in Day Room passed off from PT, more alert than in previous OT sessions but appears fatigued, agreeable to therapy with encouragement. Session focus on command follow, sustained attention to task OOB activity tolerance in prep for improved ADL/IADL/func mobility performance + decreased caregiver burden. ? ?Pt satO2 at 90+% on 4L via trach collar. ? ?Attempted RUE NMR therex with ace wrap to R grip / with 1 lb dowel rod. Despite  total hand over hand assist, verbal / visual demonstration, pt not actively participating in therex. Endorses fatigue and wish to rest via verbalizing accurate yes/no, when given written option of return to bed or chair, pt electing to remain in chair.  ? ?Dependent transport around rehab and pt oriented to unit. Seated at high low table, pt able to point to target colors when instructed with  LUE with the following results: ? - 3/5 in field of 3 ? -3/5 in field of 5 ? -2/5 in field of 6 ? ?Presented level 1 difficulty peg design, pt required max A overall to complete accurately; able to place pegs when handed correct color and with min visual cuing to place in correct spot. Board placed on his R to force R attention.  ? ?Pt required hand over hand assist to return demonstration of soft call bell. ? ?SatO2 at 96% on 5L via trach collar, HR at 99 bpm. L mit restraint in place. ?Pt left tilted back in TIS with safety belt alarm engaged, call bell in reach, and all immediate needs met.  ? ? ?Session 2 (1302-127) : Pt received in TIS, no s/sx pain, agreeable to therapy but requesting to return to bed when finished. Session focus on self-care retraining, activity tolerance, transfer retraining, functional cognition in prep for improved ADL/IADL/func mobility performance + decreased caregiver burden. ? ?Stand-pivot to his R with RW with overall mod A to keep R hand on RW + sequence turn + manage RW. Completed 2 additional sit to stands with min A to maintain R hand on RW and prevent anterior lean. LPN present to change out sensor, satO2 at 98% + on 4-5L via trach collar throughout session.  ? ?Pt going into side-lying due to fatigue. Max Encouragement to return to standing to take side-step to his L for better positioning in bed. Pt able to reurn demonstration of soft call bell use after hand-over-hand assist when soft call bell taped to side of bed rail. ? ?B soft wrist/mit restraints placed, HOB over 30 degrees, L heel in PRAFO. 4 bed rails up for safety, bed alarm engaged, call bell in reach, and all immediate needs met.  ? ? ?Therapy Documentation ?Precautions:  ?Precautions ?Precautions: Fall ?Precaution Comments: posey belt, Bil mitts, R inattention, trach ?Restrictions ?Weight Bearing Restrictions: No ? ?Pain: ?  See session notes ?ADL: See Care Tool for more details. ? ? ?Therapy/Group: Individual  Therapy ? ?Volanda Napoleon MS, OTR/L ? ?03/29/2022, 6:52 AM  ?

## 2022-03-29 NOTE — Progress Notes (Signed)
Physical Therapy Session Note ? ?Patient Details  ?Name: Todd Estrada ?MRN: [id number] ?Date of Birth: Jul 15, 1961 ? ?Today's Date: 03/29/2022 ?PT Individual Time: 0856-1000 ?PT Individual Time Calculation (min): 64 min  ? ?Short Term Goals: ?Week 1:  PT Short Term Goal 1 (Week 1): pt will trasnfer to [facility] with min assist ?PT Short Term Goal 2 (Week 1): Pt will consistently perform bed mobility with min assist ?PT Short Term Goal 3 (Week 1): Pt will ambulate 17ft with mod A +2 ? ?Skilled Therapeutic Interventions/Progress Updates:  ?Patient supine in bed on entrance to room. Resp Tech present and deep suctioning pt's tracheostomy. Patient alert and agreeable to PT session.  ? ?Patient with no pain complaint at start of session.  ? ?MD present for morning rounding after pt seated in w/c. This PT assists pt with understanding of MD's info provided re: medication changes this day.  ? ?Therapeutic Activity: ?Bed Mobility: Patient performed roll to R side with minA to reach sidelying. Roll to L side with Mod/ MaxA to reach sidelying. Brief changed performed with MaxA. Supine --> sit with HHA to L hand and MinA to reach full upright position on EOB. Is able to scoot around and forward to being both feet to floor with vc only. While seated EOB, pt able to maintain upright seated posture. ModA for doffing old scrub top and donning new one. Requires min cues for sequence.  ? ?Transfers: Patient performed sit<>stand  transfers throughout session with Min/ ModA for balance. Is able to initiate and power up with MinA but on standing requires up to Mod A to control R lean. Donned pants while standing at EOB with ModA and even providing assist but to the detriment of RUE support requiring Mod/ MaxA to briefly maintain balance. Pt self corrects with grab to RW.  Provided vc/tc for correcting balance and upright posture. Stand pivot from bed to TIS w/c with MinA.  ? ?Neuromuscular Re-ed: ?NMR facilitated during session with focus on  standing tolerance/ balance. Pt guided in sit<>stand with attempt to lengthen amount of time in standing. Pt indicates not enjoying time in standing and refusing further attempt at end of session potentially d/t increased dizziness. Participates in 2 standing bouts after dressing and initially moving to sit within 20sec but encouraged to remain standing and is able to reach 74min and 62min with focus on finding midline using mirror.  NMR performed for improvements in motor control and coordination, balance, sequencing, judgement, and self confidence/ efficacy in performing all aspects of mobility at highest level of independence.  ? ?Patient seated upright in TIS w/c at end of session with brakes locked, hand off to OT who taking over for next session.  ? ? ?Therapy Documentation ?Precautions:  ?Precautions ?Precautions: Fall ?Precaution Comments: posey belt, Bil mitts, R inattention, trach ?Restrictions ?Weight Bearing Restrictions: No ?General: ?  ?Vital Signs: ?Therapy Vitals ?Temp: 98.2 ?F (36.8 ?C) ?Temp Source: Oral ?Pulse Rate: 74 ?Resp: 16 ?BP: 113/70 ?Patient Position (if appropriate): Lying ?Oxygen Therapy ?SpO2: 96 % ?O2 Device: Tracheostomy Collar ?O2 Flow Rate (L/min): 8 L/min ?FiO2 (%): 35 % ?Pain: ? No pain complaint this session. Does c/o fatigue.  ? ?Therapy/Group: Individual Therapy ? ?Alger Simons PT, DPT ?03/29/2022, 8:51 AM  ?

## 2022-03-29 NOTE — Plan of Care (Signed)
?  Problem: RH Balance ?Goal: LTG Patient will maintain dynamic standing with ADLs (OT) ?Description: LTG:  Patient will maintain dynamic standing balance with assist during activities of daily living (OT)  ?Flowsheets (Taken 03/29/2022 1247) ?LTG: Pt will maintain dynamic standing balance during ADLs with: Minimal Assistance - Patient > 75% ?Note: Downgraded due to slower than anticipated progress. ?  ?Problem: Sit to Stand ?Goal: LTG:  Patient will perform sit to stand in prep for activites of daily living with assistance level (OT) ?Description: LTG:  Patient will perform sit to stand in prep for activites of daily living with assistance level (OT) ?Flowsheets (Taken 03/29/2022 1247) ?LTG: PT will perform sit to stand in prep for activites of daily living with assistance level: Minimal Assistance - Patient > 75% ?Note: Downgraded due to slower than anticipated progress. ?  ?Problem: RH Grooming ?Goal: LTG Patient will perform grooming w/assist,cues/equip (OT) ?Description: LTG: Patient will perform grooming with assist, with/without cues using equipment (OT) ?Flowsheets (Taken 03/29/2022 1247) ?LTG: Pt will perform grooming with assistance level of: Minimal Assistance - Patient > 75% ?Note: Downgraded due to slower than anticipated progress. ?  ?Problem: RH Bathing ?Goal: LTG Patient will bathe all body parts with assist levels (OT) ?Description: LTG: Patient will bathe all body parts with assist levels (OT) ?Flowsheets (Taken 03/29/2022 1247) ?LTG: Pt will perform bathing with assistance level/cueing: Moderate Assistance - Patient 50 - 74% ?Note: Downgraded due to slower than anticipated progress. ?  ?Problem: RH Dressing ?Goal: LTG Patient will perform lower body dressing w/assist (OT) ?Description: LTG: Patient will perform lower body dressing with assist, with/without cues in positioning using equipment (OT) ?Flowsheets (Taken 03/29/2022 1247) ?LTG: Pt will perform lower body dressing with assistance level of:  Moderate Assistance - Patient 50 - 74% ?Note: Downgraded due to slower than anticipated progress. ?  ?Problem: RH Functional Use of Upper Extremity ?Goal: LTG Patient will use RT/LT upper extremity as a (OT) ?Description: LTG: Patient will use right/left upper extremity as a stabilizer/gross assist/diminished/nondominant/dominant level with assist, with/without cues during functional activity (OT) ?Flowsheets (Taken 03/29/2022 1247) ?LTG: Pt will use upper extremity in functional activity with assistance level of: Maximal Assistance - Patient 25 - 49% ?Note: Downgraded due to slower than anticipated progress. ?  ?Problem: RH Toilet Transfers ?Goal: LTG Patient will perform toilet transfers w/assist (OT) ?Description: LTG: Patient will perform toilet transfers with assist, with/without cues using equipment (OT) ?Flowsheets (Taken 03/29/2022 1247) ?LTG: Pt will perform toilet transfers with assistance level of: Moderate Assistance - Patient 50 - 74% ?Note: Downgraded due to slower than anticipated progress. ?  ?Problem: RH Tub/Shower Transfers ?Goal: LTG Patient will perform tub/shower transfers w/assist (OT) ?Description: LTG: Patient will perform tub/shower transfers with assist, with/without cues using equipment (OT) ?Flowsheets (Taken 03/29/2022 1247) ?LTG: Pt will perform tub/shower stall transfers with assistance level of: Moderate Assistance - Patient 50 - 74% ?Note: Downgraded due to slower than anticipated progress. ?  ?

## 2022-03-29 NOTE — Progress Notes (Signed)
? ?NAME:  Todd Estrada, MRN:  [id number], DOB:  01-07-1961, LOS: 7 ?ADMISSION DATE:  03/22/2022, CONSULTATION DATE:  3/16 ?REFERRING MD:  Erlinda Hong, CHIEF COMPLAINT:  Confusion, seizure  ? ?History of Present Illness:  ?61 y/o male with a history of Left MCA stroke in 9937 with a complicated post stroke course presented on 3/15 with a 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  Intubated on 3/16 for airway protection.  Transferred to rehab for 03/2022 ? ?Pertinent  Medical History  ?Hyeprtension ?Left MCA stroke ?Chronic combined CHF ? ?Significant Hospital Events: ?Including procedures, antibiotic start and stop dates in addition to other pertinent events   ?3/15 admitted for acute L MCA stroke due to L ICA occlusion with first time seizure s/p revascularization of L ICA occlusion ?3/16 Intubated for airway protection; started unasyn; echo LVEF 40-45%, RVSP normal ?3/17 MRI Brain showed acute infarct in left frontal, temporal and parietal lobes with findings worrisome for acute infarct, left vert occluded; CT head did not show hemorrhage; respiratory notes thick secretions,  ?3/18 following some commands on exam, extubated the re-intubated due to hypoxemia, inability to clear secretions ?3/19 self extubated overnight, remains on NRB ?3/21 required reintubation for inability to protect airway and aspiration  ?3/22 bedside perc tracheostomy with bronchoscopy  ?3/24 PEG ?3/26 started on trach collar ?03/17/2022 pulmonary critical care called to bedside for patient who has decannulate himself x2.  He is now restrained and most likely needs a sitter ?03/22/2022 transferred to rehab ?03/23/2022 pulmonary critical care consulted ?03/28/2022 Pulmonary Critical care asked to assess for increased oxygen needs ?Interim History / Subjective:  ?Called by rehab staff 4/10 am. Patient with increased secretions and increasing oxygen demands. He is more lethargic than his baseline. ?Rehab staff have asked for [place] to assess  for concern of worsening respiratory status.  ?He has increased oxygen from 35% TC to 60% TC overnight. ?CXR and ABG are pending ?? If he is having a COPD flare vs aspiration  ? ?Objective   ?Blood pressure 106/67, pulse 88, temperature 97.8 ?F (36.6 ?C), resp. rate 20, weight 77.9 kg, SpO2 97 %. ?   ?FiO2 (%):  [28 %-35 %] 28 %  ? ?Intake/Output Summary (Last 24 hours) at 03/29/2022 1633 ?Last data filed at 03/29/2022 0500 ?Gross per 24 hour  ?Intake --  ?Output 700 ml  ?Net -700 ml  ? ?Filed Weights  ? 03/26/22 0501 03/27/22 0500 03/28/22 0512  ?Weight: 77 kg 78.2 kg 77.9 kg  ? ? ?General: Chronically ill-appearing man, laying in bed ?HEENT: #6 uncuffed trach with some tan secretions.  Strong voice but not capped ?Neuro: Persistent right-sided weakness.  Will follow commands bilaterally upper and lower extremities ?CV: Distant, regular ?PULM: Coarse bilateral breath sounds, decreased  both bases ?GI: Nondistended ?Extremities: No edema ?Skin: no rashes or lesions ? ? ? ?Resolved Hospital Problem list   ?Septic shock due to aspiration pneumonia> resolved ?Constipation ? ?Assessment & Plan:  ?Acute encephalopathy due to left MCA stroke from left ICA occlusion requiring thrombectomy and stent placement on 3/15 ?Seizure disorder ?Agitated delirium on 3/19> risk to self pulling at tubes, lines ?Interacting appropriately currently ? ?Acute hypoxemic respiratory failure due to aspiration pneumonia s/p tracheostomy > improving ?Thick pulmonary secretions ?Concern for emphysema/COPD > not in history but exam supports ?4/10 concern for increased oxygen needs and  ?Patient required intubation on 03/05/2021 by pulmonary critical care ?Subsequent tracheostomy 03/09/2022.  CCM following the tracheostomy and while he was  in the progressive care unit.  He had copious thick secretions that made decannulation not an option.  He was also near obtunded and could not protect his airway at this time.   ?He had improved, was doing well ,  but 4/10 has increased oxygen needs , increased secretions and increased lethargy.  Resolved quickly.  Currently at baseline 4/11 ? ?Plan ?Return to good baseline.  Suspect mucous plugging on 4/10.  Continue standard trach care, intermittent suctioning and pulmonary hygiene.  Continue bronchodilators as ordered ?With improving neurological status could consider capping trials going forward.  He may be a candidate for decannulation ? ?Combined systolic and diastolic heart failure, chronic ?Hypertension ?Per primary ? ?RUE thrombophlebitis ?Per primary ? ?Best Practice (right click and "Reselect all SmartList Selections" daily)  ?Per primary ?Diet/type: tubefeeds ?DVT prophylaxis: LMWH ?GI prophylaxis: PPI ?Lines: N/A ?Foley:  N/A ?Code Status:  full code ?Last date of multidisciplinary goals of care discussion [per stroke service] ? ?Labs   ?CBC: ?Recent Labs  ?Lab 03/23/22 ?2440 03/26/22 ?1427 03/28/22 ?0753  ?WBC 14.9* 9.0 10.0  ?NEUTROABS 11.6* 6.6 6.9  ?HGB 13.9 15.0 14.6  ?HCT 42.6 45.5 43.5  ?MCV 92.2 91.2 90.6  ?PLT 410* 321 269  ? ? ?Basic Metabolic Panel: ?Recent Labs  ?Lab 03/23/22 ?1027 03/28/22 ?0753  ?NA 135 135  ?K 4.2 4.8  ?CL 102 98  ?CO2 25 29  ?GLUCOSE 130* 114*  ?BUN 24* 27*  ?CREATININE 0.76 0.85  ?CALCIUM 8.6* 9.0  ? ?GFR: ?Estimated Creatinine Clearance: 100.2 mL/min (by C-G formula based on SCr of 0.85 mg/dL). ?Recent Labs  ?Lab 03/23/22 ?2536 03/26/22 ?1427 03/28/22 ?0753  ?WBC 14.9* 9.0 10.0  ? ? ?Liver Function Tests: ?Recent Labs  ?Lab 03/23/22 ?6440  ?AST 40  ?ALT 61*  ?ALKPHOS 93  ?BILITOT 0.3  ?PROT 6.6  ?ALBUMIN 2.5*  ? ?No results for input(s): LIPASE, AMYLASE in the last 168 hours. ?No results for input(s): AMMONIA in the last 168 hours. ? ?ABG ?   ?Component Value Date/Time  ? PHART 7.49 (H) 03/28/2022 1205  ? PCO2ART 41 03/28/2022 1205  ? PO2ART 87 03/28/2022 1205  ? HCO3 31.2 (H) 03/28/2022 1205  ? TCO2 34 (H) 03/08/2022 1732  ? O2SAT 99.4 03/28/2022 1205  ?  ? ?Coagulation  Profile: ?No results for input(s): INR, PROTIME in the last 168 hours. ? ?Cardiac Enzymes: ?No results for input(s): CKTOTAL, CKMB, CKMBINDEX, TROPONINI in the last 168 hours. ? ?HbA1C: ?Hgb A1c MFr Bld  ?Date/Time Value Ref Range Status  ?03/03/2022 04:30 AM 5.4 4.8 - 5.6 % Final  ?  Comment:  ?  (NOTE) ?        Prediabetes: 5.7 - 6.4 ?        Diabetes: >6.4 ?        Glycemic control for adults with diabetes: <7.0 ?  ?08/19/2021 07:49 AM 5.5 4.8 - 5.6 % Final  ?  Comment:  ?  (NOTE) ?Pre diabetes:          5.7%-6.4% ? ?Diabetes:              >6.4% ? ?Glycemic control for   <7.0% ?adults with diabetes ?  ? ? ?CBG: ?Recent Labs  ?Lab 03/23/22 ?1205 03/24/22 ?0001 03/24/22 ?0406 03/25/22 ?0021 03/29/22 ?3474  ?GLUCAP 146* 105* 105* 147* 118*  ? ?Critical care time: na  ?  ? ? ?Baltazar Apo, MD, PhD ?03/29/2022, 4:34 PM ?[facility] ?[phone] or if no  answer before 7:00PM call [phone] ?For any issues after 7:00PM please call eLink [phone] ? ? ? ? ? ?

## 2022-03-29 NOTE — Progress Notes (Addendum)
Speech Language Pathology Daily Session Note ? ?Patient Details  ?Name: Todd Estrada ?MRN: [id number] ?Date of Birth: 08-08-1961 ? ?Today's Date: 03/29/2022 ?SLP Individual Time: 1105-1200 ?SLP Individual Time Calculation (min): 55 min ? ?Short Term Goals: ?Week 1: SLP Short Term Goal 1 (Week 1): Patient will demonstrate improved alertness in order to functionally participate in further cognitive-linguistic/language assessment. ?SLP Short Term Goal 2 (Week 1): Patient will respond to yes/no questions via multimodal communication with 50% accuracy in regards to biographical and environmental information with max A verbal/visual cues ?SLP Short Term Goal 3 (Week 1): Patient maintain arousal and in order to tolerate PMSV as evidenced by SpO2>92% for 15 minute duration. ?SLP Short Term Goal 4 (Week 1): Patient will consume therapeutic PO trials following oral care with mod A cues for implementation of safe swallowing precautions and strategies ?SLP Short Term Goal 5 (Week 1): Patient will be oriented to person/place/time/situation with max A multimodal cues ?SLP Short Term Goal 6 (Week 1): Pt will verbalize basic wants/needs via multimodal communication and max A cues ? ?Skilled Therapeutic Interventions: Skilled ST treatment focused on cognitive-linguistic goals. Pt received in [name] chair and appeared lethargic. O2 saturations ~94-95% and HR ~90-94 bpm. SLP donned PMSV for duration of session and vitals remained stable with minimal fluctuations. Pt with minimal attempts to vocalize or verbally communicate with the exception of "yeah" and "no" in response to Y/N questions along with head nods Pt with no visible attempts to engage in automatic speech tasks or phoneme production/mirroring/repetition. Total A was provided for completion of oral care using suction swab. Pt shook his head no when asked if he was willing to participate in BSE; deferred until pt in agreement. SLP provided patient with picture based communication  systems via communication board, yes/no board, and visual pain scale. Educated patient re: uses and benefits of multimodal and supportive communication. Pt with no response when prompted to point to field of 2-4. SLP doffed PMSV at end of session; no back pressure noted. Patient was left in bed with soft mit in place, alarm activated and immediate needs met. Continue per current plan of care.   ?   ?Pain ? None ? ?Therapy/Group: Individual Therapy ? ?Todd Estrada Todd Estrada ?03/29/2022, 4:44 PM ?

## 2022-03-29 NOTE — Evaluation (Signed)
Speech Language Pathology Clinical Swallow Assessment ? ?Patient Details  ?Name: Todd Estrada ?MRN: [id number] ?Date of Birth: [birth year] ? ?SLP Diagnosis: Dysphagia  ?Rehab Potential: Fair ?ELOS: 3.5-4 weeks  ? ?Today's Date: 03/29/2022 ?SLP Individual Time: 2992-4268 ?SLP Individual Time Calculation (min): 35 min ? ?Hospital Problem: Principal Problem: ?  Acute ischemic left MCA stroke ([place]) ?Active Problems: ?  Chronic respiratory failure with hypoxia (HCC) ? ?Past Medical History:  ?Past Medical History:  ?Diagnosis Date  ? Hypertension   ? ?Past Surgical History:  ?Past Surgical History:  ?Procedure Laterality Date  ? ESOPHAGOGASTRODUODENOSCOPY (EGD) WITH PROPOFOL N/A 03/11/2022  ? Procedure: ESOPHAGOGASTRODUODENOSCOPY (EGD) WITH PROPOFOL;  Surgeon: Georganna Skeans, MD;  Location: [place];  Service: General;  Laterality: N/A;  ? IR ANGIO INTRA EXTRACRAN SEL COM CAROTID INNOMINATE UNI R MOD SED  03/02/2022  ? IR CT HEAD LTD  03/02/2022  ? IR PERCUTANEOUS ART THROMBECTOMY/INFUSION INTRACRANIAL INC DIAG ANGIO  03/02/2022  ? PEG PLACEMENT N/A 03/11/2022  ? Procedure: PERCUTANEOUS ENDOSCOPIC GASTROSTOMY (PEG) PLACEMENT;  Surgeon: Georganna Skeans, MD;  Location: [place];  Service: General;  Laterality: N/A;  ? RADIOLOGY WITH ANESTHESIA N/A 03/02/2022  ? Procedure: IR WITH ANESTHESIA;  Surgeon: Luanne Bras, MD;  Location: [place];  Service: Radiology;  Laterality: N/A;  ? ? ?Assessment / Plan / Recommendation ?Clinical Impression Pt was received in bed awake/alert and agreeable to participate in bedside swallow evaluation. SLP provided set-up and total A for completion of oral care using suction toothbrush prior to PO trials. SLP donned PMSV; vitals remained WNL for duration of encounter. Pt consumed nectar thick liquids and puree via tsp, as well as cup sips of nectar thick liquids with mildly delayed/disorganized AP transit, mild anterior labial spillage, mild oral residuals, and occasional delayed throat  clear. There was one instance of delayed cough following >5 minute duration. Pt performed self feeding with overall sup A and required SLP to scoop pureed material on to spoon. Given tolerance of PO trials and significantly improved arousal, SLP recommends initiation of dysphagia 1 diet and nectar thick liquids. Diet further supported by recent MBS obtained on 4/4. May initiate crushed meds in puree. Recommend pt receive full supervision with all PO intake, and PMSV donned prior to PO consumption. Educated patient of recommendations in which pt indicated agreement with head nod "yes". Informed nurse and updated all staff communication. Diet order was also updated to reflect advancement. Continue to follow for swallowing in addition to pre-established POC. SLP doffed PMSV at end of session; no back pressure noted. Patient was left in bed with soft mits and wrist restraints in place, alarm activated and immediate needs met. Continue per current plan of care.   ?  ?Skilled Therapeutic Interventions          BSE completed  ?SLP Assessment ? Patient will need skilled Speech Lanaguage Pathology Services during CIR admission  ?  ?Recommendations ? Patient may use Passy-Muir Speech Valve: During all therapies with supervision;Intermittently with supervision;During PO intake/meals ?PMSV Supervision: Full ?MD: Please consider changing trach tube to : Cuffless ?SLP Diet Recommendations: Dysphagia 1 (Puree);Nectar ?Liquid Administration via: Cup ?Medication Administration: Crushed with puree ?Supervision: Staff to assist with self feeding;Full supervision/cueing for compensatory strategies ?Compensations: Minimize environmental distractions;Slow rate;Small sips/bites ?Postural Changes and/or Swallow Maneuvers: Seated upright 90 degrees;Upright 30-60 min after meal ?Oral Care Recommendations: Oral care QID ?Patient destination: Home ?Follow up Recommendations: Home Health SLP;24 hour supervision/assistance ?Equipment  Recommended: To be determined  ?  ?SLP  Frequency 3 to 5 out of 7 days   ?SLP Duration ? ?SLP Intensity ? ?SLP Treatment/Interventions 3.5-4 weeks ? ?Minumum of 1-2 x/day, 30 to 90 minutes ? ?Dysphagia/aspiration precaution training;Therapeutic Exercise;Patient/family education   ? ?Care Tool ?Care Tool Cognition ?Ability to hear (with hearing aid or hearing appliances if normally used Ability to hear (with hearing aid or hearing appliances if normally used): 0. Adequate - no difficulty in normal conservation, social interaction, listening to TV ?  ?Expression of Ideas and Wants Expression of Ideas and Wants: 1. Rarely/Never expressess or very difficult - rarely/never expresses self or speech is very difficult to understand ?  ?Understanding Verbal and Non-Verbal Content Understanding Verbal and Non-Verbal Content: 2. Sometimes understands - understands only basic conversations or simple, direct phrases. Frequently requires cues to understand  ?Memory/Recall Ability Memory/Recall Ability : None of the above were recalled  ? ? ?Bedside Swallowing Assessment ?General ?Date of Onset: 03/03/22 ?Previous Swallow Assessment: MBS on 03/22/22 ?Diet Prior to this Study: NPO ?Temperature Spikes Noted: No ?Respiratory Status: Trach ?Trach Size and Type: Uncuffed;#6 ?History of Recent Intubation: Yes ?Length of Intubations (days): 3 days ?Date extubated: 03/05/22 ?Behavior/Cognition: Alert;Cooperative ?Oral Cavity - Dentition: Poor condition;Missing dentition ?Self-Feeding Abilities: Needs assist ?Patient Positioning: Upright in bed ?Baseline Vocal Quality: Low vocal intensity ?Volitional Cough: Cognitively unable to elicit ?Volitional Swallow: Unable to elicit  ?Oral Care Assessment ?Does patient have any of the following "high(er) risk" factors?: Tracheostomy with trach collar 24 hrs./day ?Does patient have any of the following "at risk" factors?: Tongue - coated;Lips - dry, cracked ?Patient is HIGH RISK: Non-ventilated: Order  set for Adult Oral Care Protocol initiated - "High Risk Patients - Non-Ventilated" option selected  (see row information) ?Patient is AT RISK: Order set for Adult Oral Care Protocol initiated -  "At Risk Patients" option selected (see row information) ?Ice Chips ?Ice chips: Not tested ?Thin Liquid ?Thin Liquid: Not tested ?Nectar Thick ?Nectar Thick Liquid: Impaired ?Presentation: Cup ?Oral phase functional implications: Prolonged oral transit ?Pharyngeal Phase Impairments: Suspected delayed Swallow;Multiple swallows;Throat Clearing - Delayed ?Honey Thick ?Honey Thick Liquid: Not tested ?Puree ?Puree: Impaired ?Oral Phase Functional Implications: Prolonged oral transit ?Pharyngeal Phase Impairments: Multiple swallows ?Solid ?Solid: Not tested ?BSE Assessment ?Risk for Aspiration ?Impact on safety and function: Mild aspiration risk;Moderate aspiration risk ?Other Related Risk Factors: Cognitive impairment;Previous CVA;Lethargy;Decreased respiratory status ? ?Short Term Goals: ?Week 1: SLP Short Term Goal 1 (Week 1): Patient will demonstrate improved alertness in order to functionally participate in further cognitive-linguistic/language assessment. ?SLP Short Term Goal 2 (Week 1): Patient will respond to yes/no questions via multimodal communication with 50% accuracy in regards to biographical and environmental information with max A verbal/visual cues ?SLP Short Term Goal 3 (Week 1): Patient maintain arousal and in order to tolerate PMSV as evidenced by SpO2>92% for 15 minute duration. ?SLP Short Term Goal 4 (Week 1): Patient will consume therapeutic PO trials following oral care with mod A cues for implementation of safe swallowing precautions and strategies ?SLP Short Term Goal 4 - Progress (Week 1): Met ?SLP Short Term Goal 5 (Week 1): Patient will be oriented to person/place/time/situation with max A multimodal cues ?SLP Short Term Goal 6 (Week 1): Pt will verbalize basic wants/needs via multimodal communication  and max A cues ? ?Refer to Care Plan for Long Term Goals ? ?Recommendations for other services: None  ? ?Discharge Criteria: Patient will be discharged from SLP if patient refuses treatment 3 consecutive times witho

## 2022-03-29 NOTE — Progress Notes (Signed)
?                                                       PROGRESS NOTE ? ? ?Subjective/Complaints: ? ?Per CCM, desat related to mucus plug, glycopyrolate d/ced ? ?ROS: cannot obtain due to aphasia  ? ?Objective: ?  ?DG CHEST PORT 1 VIEW ? ?Result Date: 03/28/2022 ?CLINICAL DATA:  Dyspnea EXAM: PORTABLE CHEST 1 VIEW COMPARISON:  03/23/2022 FINDINGS: Emphysema. Left perihilar mass and right lung nodules are again identified. Left basilar atelectasis/consolidation. No pleural effusion or pneumothorax. Similar cardiomediastinal contours. IMPRESSION: Similar lung aeration with left perihilar mass, right lung nodules, and left basilar atelectasis/consolidation. Electronically Signed   By: Macy Mis M.D.   On: 03/28/2022 15:12   ?Recent Labs  ?  03/26/22 ?1427 03/28/22 ?0753  ?WBC 9.0 10.0  ?HGB 15.0 14.6  ?HCT 45.5 43.5  ?PLT 321 269  ? ? ?Recent Labs  ?  03/28/22 ?0753  ?NA 135  ?K 4.8  ?CL 98  ?CO2 29  ?GLUCOSE 114*  ?BUN 27*  ?CREATININE 0.85  ?CALCIUM 9.0  ? ? ? ?Intake/Output Summary (Last 24 hours) at 03/29/2022 1041 ?Last data filed at 03/29/2022 0500 ?Gross per 24 hour  ?Intake --  ?Output 700 ml  ?Net -700 ml  ? ?  ? ?Pressure Injury 03/22/22 Heel Right;Lateral Deep Tissue Pressure Injury - Purple or maroon localized area of discolored intact skin or blood-filled blister due to damage of underlying soft tissue from pressure and/or shear. (Active)  ?03/22/22 1500  ?Location: Heel  ?Location Orientation: Right;Lateral  ?Staging: Deep Tissue Pressure Injury - Purple or maroon localized area of discolored intact skin or blood-filled blister due to damage of underlying soft tissue from pressure and/or shear.  ?Wound Description (Comments):   ?Present on Admission: Yes  ? ? ?Physical Exam: ?Vital Signs ?Blood pressure 113/70, pulse 84, temperature 98.2 ?F (36.8 ?C), temperature source Oral, resp. rate 17, weight 77.9 kg, SpO2 95 %. ? ?General: No acute distress ?Mood and affect are appropriate ?Heart: Regular rate  and rhythm no rubs murmurs or extra sounds ?Lungs: Clear to auscultation, breathing unlabored, no rales or wheezes ?Abdomen: Positive bowel sounds, soft nontender to palpation, nondistended ?Extremities: No clubbing, cyanosis, or edema ? ? ?Psych: somnolent ?Skin: ?PEG site LUQ, no drainage or tenderness  ?Extremities: No clubbing, cyanosis, or edema, IV site L arm CDI  ?Skin: No evidence of breakdown, no evidence of rash ?Neurologic: Cranial nerves II through XII intact, motor strength is 4/5 in Left , 0/5 RIght deltoid, bicep, tricep, grip, 4/5 Left and 2/5 Ri proximal hip flexor, knee extensors, ankle dorsiflexor and plantar flexor ? ?Musculoskeletal: Full range of motion in all 4 extremities. No joint swelling ? ? ?Assessment/Plan: ?1. Functional deficits which require 3+ hours per day of interdisciplinary therapy in a comprehensive inpatient rehab setting. ?Physiatrist is providing close team supervision and 24 hour management of active medical problems listed below. ?Physiatrist and rehab team continue to assess barriers to discharge/monitor patient progress toward functional and medical goals ? ?Care Tool: ? ?Bathing ?   ?Body parts bathed by patient: Face  ?   ?  ?  ?Bathing assist Assist Level: Maximal Assistance - Patient 24 - 49% (Per OT eval note for bathing both the upper and lower body parts) ?  ?  ?  Upper Body Dressing/Undressing ?Upper body dressing   ?What is the patient wearing?: [place] only ?   ?Upper body assist Assist Level: Total Assistance - Patient < 25% ?   ?Lower Body Dressing/Undressing ?Lower body dressing ? ? ?   ?What is the patient wearing?: Pants ? ?  ? ?Lower body assist Assist for lower body dressing: Total Assistance - Patient < 25% ?   ? ?Toileting ?Toileting    ?Toileting assist Assist for toileting: Dependent - Patient 0% ?  ?  ?Transfers ?Chair/bed transfer ? ?Transfers assist ?   ? ?Chair/bed transfer assist level: Dependent - mechanical lift (stedy) ?  ?   ?Locomotion ?Ambulation ? ? ?Ambulation assist ? ?   ? ?Assist level: 2 helpers ?Assistive device: Other (comment) ?Max distance: 10  ? ?Walk 10 feet activity ? ? ?Assist ?   ? ?Assist level: 2 helpers ?Assistive device: Other (comment)  ? ?Walk 50 feet activity ? ? ?Assist Walk 50 feet with 2 turns activity did not occur: Safety/medical concerns ? ?  ?   ? ? ?Walk 150 feet activity ? ? ?Assist Walk 150 feet activity did not occur: Safety/medical concerns ? ?  ?  ?  ? ?Walk 10 feet on uneven surface  ?activity ? ? ?Assist Walk 10 feet on uneven surfaces activity did not occur: Safety/medical concerns ? ? ?  ?   ? ?Wheelchair ? ? ? ? ?Assist Is the patient using a wheelchair?: Yes ?Type of Wheelchair: Manual (per PT note, TIS w/c) ?  ? ?Wheelchair assist level: Dependent - Patient 0% ?Max wheelchair distance: 150  ? ? ?Wheelchair 50 feet with 2 turns activity ? ? ? ?Assist ? ?  ?  ? ? ?Assist Level: Dependent - Patient 0%  ? ?Wheelchair 150 feet activity  ? ? ? ?Assist ?   ? ? ?Assist Level: Dependent - Patient 0%  ? ?Blood pressure 113/70, pulse 84, temperature 98.2 ?F (36.8 ?C), temperature source Oral, resp. rate 17, weight 77.9 kg, SpO2 95 %. ? ?Medical Problem List and Plan: ?1. Functional deficits secondary to left MCA stroke with right-sided weakness, slurred speech and expressive aphasia on admission ?            -patient may  shower with PMV and PEG ?            -ELOS/Goals: 2.5 to 3 weeks- min A to supervision ? Continue CIR team conf in am  ?2.  Antithrombotics: ?-DVT/anticoagulation:  Pharmaceutical: Lovenox ?            -antiplatelet therapy: Plavix and aspirin ?3. Pain Management: Tylenol prn ?4. Mood: LCSW to evaluate and provide emotional support ?            -antipsychotic agents: Seroquel 25 mg q HS ?            --Ativan prn agitation; continue restraints for now ?5. Neuropsych: This patient is not capable of making decisions on his own behalf. ?6. Skin/Wound Care: Routine skin care checks ?             --known pressure injury versus bruise>>? Left buttock ?            --dry, ischemic ulcers bilateral toes ?7. Fluids/Electrolytes/Nutrition: Routine Is and Os and follow-up chemistries ?            --NPO ?            --on continuous TFs at 55 cc/hr>>ask dietary to assist  with conversion to TF bouses ?            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            --on Plavix and aspirin ?9: CHF: "eventually resume metoprolol 12.5 mg BID", does not appear to be in fluid overload clinically but in DDx , receiving ~1151ml of fluid per day  ?10: Hypertension: continue TID monitoring ?Vitals:  ? 03/29/22 0523 03/29/22 0854  ?BP:    ?Pulse: 74 84  ?Resp: 16 17  ?Temp:    ?SpO2: 96% 95%  ?  ?11: Hyperlipidemia: continue Lipitor 40 mg daily ?12: Seizure-type activity: continue Keppra 500 mg BID ?13: Alcohol abuse: provide cessation counseling- ETOH neg on admission  ?            --continue thiamine supplementation ?14: Trach/Aspiration pneumonia and multiple intubation/extubation for airway protection ?            --s/p tracheostomy 3/22>>continue Robinul (? discontinue for dry tongue) ?            --now with cuffless Shiley #6 with humidified air - using PMV ?            --follow-up outpatient tach clinic ?15:Dysphagia ?--s/p PEG 3/24- is NPO- needs abd binder ot protect PEG, changing to TF boluses ?16: COPD: continue Candiss Norse and Pulmicort ?            -CHeck ABG today as we are increasing O2  ?17: Tobacco abuse: continue nicotine patch ?            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 PET CT examination is recommended ?for further evaluation. Associated left hilar pathologic adenopathy. ?20: Transaminitis: follow-up CMP tomorrow ?21: Left MCA stroke 08/2021 with petechial hemorrhage on  aspirin monotherapy ?22. Loose stools- due to Colace? Suggest bulking up stools at this point.  ? 23.  Leukocytosis post PNA- cont to monitor - remains afebrile but at risk for UTI or PNA  ?  ? 4/9: Keflex started on

## 2022-03-29 NOTE — Plan of Care (Signed)
?  Problem: RH Swallowing ?Goal: LTG Patient will participate in dysphagia therapy to increase swallow function with assistance (SLP) ?Description: LTG:  Patient will participate in dysphagia therapy to increase swallow function with assistance (SLP) ?Flowsheets (Taken 03/29/2022 1659) ?LTG: Pt will participate in dysphagia therapy to increase swallow function with assistance of (SLP): Supervision ?  ?

## 2022-03-30 LAB — GLUCOSE, CAPILLARY: Glucose-Capillary: 169 mg/dL — ABNORMAL HIGH (ref 70–99)

## 2022-03-30 MED ORDER — OSMOLITE 1.5 CAL PO LIQD
356.0000 mL | Freq: Three times a day (TID) | ORAL | Status: DC
  Administered 2022-03-30 – 2022-04-05 (×9): 356 mL

## 2022-03-30 NOTE — Progress Notes (Signed)
Deceased anxiety. Continue to redirect as need. Patient had 75 % of his dinner. Answering questions with a yes or no, also shakes his head.  ?

## 2022-03-30 NOTE — Progress Notes (Signed)
Speech Language Pathology Weekly Progress and Session Note ? ?Patient Details  ?Name: Todd Estrada ?MRN: [id number] ?Date of Birth: 19-Oct-1961 ? ?Beginning of progress report period: March 23, 2022 ?End of progress report period: March 30, 2022 ? ?Today's Date: 03/30/2022 ?SLP Individual Time: 5361-4431 ?SLP Individual Time Calculation (min): 45 min ? ?Short Term Goals: ?Week 1: SLP Short Term Goal 1 (Week 1): Patient will demonstrate improved alertness in order to functionally participate in further cognitive-linguistic/language assessment. ?SLP Short Term Goal 1 - Progress (Week 1): Met ?SLP Short Term Goal 2 (Week 1): Patient will respond to yes/no questions via multimodal communication with 50% accuracy in regards to biographical and environmental information with max A verbal/visual cues ?SLP Short Term Goal 2 - Progress (Week 1): Met ?SLP Short Term Goal 3 (Week 1): Patient maintain arousal and in order to tolerate PMSV as evidenced by SpO2>92% for 15 minute duration. ?SLP Short Term Goal 3 - Progress (Week 1): Met ?SLP Short Term Goal 4 (Week 1): Patient will consume therapeutic PO trials following oral care with mod A cues for implementation of safe swallowing precautions and strategies ?SLP Short Term Goal 4 - Progress (Week 1): Met ?SLP Short Term Goal 5 (Week 1): Patient will be oriented to person/place/time/situation with max A multimodal cues ?SLP Short Term Goal 5 - Progress (Week 1): Met ?SLP Short Term Goal 6 (Week 1): Pt will verbalize basic wants/needs via multimodal communication and max A cues ?SLP Short Term Goal 6 - Progress (Week 1): Met ? ?New Short Term Goals: ?Week 2: SLP Short Term Goal 1 (Week 2): Patient will respond to basic yes/no questions in regards to biographical and environmental information with mod A verbal/visual cues to achieve 75% accuracy ?SLP Short Term Goal 2 (Week 2): Patient will consistently tolerate PMSV as evidenced by SpO2>92% for 30 minute duration ?SLP Short Term Goal  3 (Week 2): Patient will consume current diet with minimal overt s/sx of aspiration and min A cues for implementation of safe swallowing precautions and strategies ?SLP Short Term Goal 4 (Week 2): Patient will be oriented to person/place/time/situation with min A cues for use of external aids ?SLP Short Term Goal 5 (Week 2): Pt will communicate basic wants/needs via multimodal communication and mod A cues ?SLP Short Term Goal 6 (Week 2): Patient will identify at the word level given field of 3 written choices with mod A to achieve 75% accuracy ? ?Weekly Progress Updates: Patient has made slow progress since admission to CIR however demonstrates noticeable improvement with level of alertness and participation this week. Pt has met 6 of 6 STGs this reporting period in the areas of cognitive-communication, speech, and swallowing goals. Patient is currently able to communicate functional needs via multimodal communication with overall min-to-mod A for basic comprehension and expression. Pt primarily and most successfully communicates through responding to concrete yes/no questions by verbalizing "yeah" or "no" and/or gestures/head nods with inconsistent accuracy. Pt is able to read basic content at the word level when given field of 2 choices and has been able to communicate preferences by pointing to written responses on dry erase board. As of most recent session, pt was able to tolerate PMSV for 40 minute duration with vitals WFL. Until today, patient has appeared uninterested in wearing PMSV during SLP sessions however eventually agreeable following discussion/education on purposes. SLP initiated an oral diet on 4/11 consisting of dysphagia 1 textures and nectar thick liquids under full supervision. Intake has been poor since initiation of  diet secondary to poor appetite. Pt continues to receive primary nutrition via PEG at this time and working with Dietitian to modify tube feeds. Patient and family education is  ongoing. Patient would benefit from continued skilled SLP intervention to maximize communication, speech, and swallow functioning. ?  ?Intensity: Minumum of 1-2 x/day, 30 to 90 minutes ?Frequency: 3 to 5 out of 7 days ?Duration/Length of Stay: 4/28 ?Treatment/Interventions: Dysphagia/aspiration precaution training;Therapeutic Exercise;Patient/family education;Cognitive remediation/compensation;Cueing hierarchy;Functional tasks;Internal/external aids;Multimodal communication approach;Speech/Language facilitation ? ? ?Daily Session ?Skilled Therapeutic Interventions: Skilled ST treatment focused on communication and swallowing goals. Patient was received in awake/alert in [place] chair on arrival. Lunch tray was untouched in room. Pt denied consideration of meal during session. Nurse reported pt was uninterested in consuming breakfast this a.m. due to poor appetite. Pt was agreeable to consume nectar thick liquids. SLP was able to identify preferred beverage by having pt point to field of 4 written choices + verbal cues. Pt selected nectar thick orange juice. SLP donned PMSV prior to consumption. O2 saturation remained >95% with PMSV in place for 40 minute duration; HR ranged between 72-92 bpm. Pt consumed single sips of nectar thick liquids with occasional delayed throat clearing. Mild anterior spillage was observed with minimal pt awareness, however this likely attributed to long facial hair being submerged into cup. SLP recommends continuation of current diet and full supervision during meals.  ? ?SLP assessed reading comprehension at the word level. Pt identified field of 2 functional words with 100% accuracy and sup A verbal cues; obtained 33% accuracy when given field of 3 functional words with no support. Pt pointed to field of 3 written choices to answer orientation questions. He was oriented to person/name, day of week, year, situation, and place; external aid was required for month.  ?Pt identified field of 2  pictures with 90% accuracy and sup A verbal cues when given object name only; 75% accuracy to identify field of 2 pictures based on object function. Pt was unable to initiate or repeat at the phoneme or word level though made visible attempts by opening his mouth and intently looking at therapist. Pt endorsed mild discomfort in back due to positioning in chair. SLP utilized visual pain scale while pt pointed to green smiley face when asked "how much pain?" Through follow up Y/N questions, it was discovered discomfort resolved following repositioning thus pt likely utilized pain scale accurately. Pt was unable to initiate automatic speech tasks and unable to mimic oral motor movements. Pt most consistently responded to basic questions regarding wants/needs/comfort via verbal responses "yeah" and "no" with clear speech and mildly reduced vocal intensity, though much improved from previous encounters. Additional attempts to verbally communicate consisted of unintelligible verbalizations. SLP doffed PMSV at end of session and left patient in [place] wheelchair with soft mits and wrist restraints in place, alarm activated and immediate needs met. Continue per current plan of care.   ? ?General  ?  ?Pain ?  ?Therapy/Group: Individual Therapy ? ?Westin Knotts T Brailey Buescher ?03/30/2022, 4:26 PM ? ? ? ? ? ? ?

## 2022-03-30 NOTE — Progress Notes (Signed)
Patient ID: Todd Estrada, male   DOB: 22-May-1961, 61 y.o.   MRN: [id number] ? ?Sw made attempt to contact patient family to provide conference updates, sw will continue to follow up ?

## 2022-03-30 NOTE — Progress Notes (Signed)
Physical Therapy Session Note ? ?Patient Details  ?Name: Todd Estrada ?MRN: [id number] ?Date of Birth: 1961-11-22 ? ?Today's Date: 03/30/2022 ?PT Individual Time: 3299-2426 ?PT Individual Time Calculation (min): 53 min  ? ?Short Term Goals: ?Week 1:  PT Short Term Goal 1 (Week 1): pt will trasnfer to [facility] with min assist ?PT Short Term Goal 2 (Week 1): Pt will consistently perform bed mobility with min assist ?PT Short Term Goal 3 (Week 1): Pt will ambulate 88f with mod A +2 ? ? ?Skilled Therapeutic Interventions/Progress Updates:  ? ?Pt received sitting in WC and agreeable to PT. Pt noted to have been incontinent. With suspicion of condom catheter leaking from distal attachment to bag. PT cleaned Wheelchair and emptied catheterbag.  ? ?Pt transported to rail in hall. Gait trainnigx 843fwith mod assist from PT to improve step length on the RLE; pt the abruptly sitting. With ues no questions. Pt reports feeling dizzy and lightheaded in standing; denies performing additional gait training due to dizziness.  ? ?PT performed orthostatic VS.  ?Sitting.103/69 HR 91  ? Standing. 90/83 HR 108 ?Standing with Ted hose.  107/81 pt reports symptomatic in standing with mild improvement with ted hose in place but continues to be symptomati.  ?Pt returned to room and performed stand pivot transfer to bed with min assist. Sit<>stand from EOB to assess incontinence. Noted to have been incontinent of bowel and bladder. Sit>supine completed with CGA for safety. Rolling R and L  with min assist for initiation for PT to perform pericare total A*.  Pt left supine in bed with call bell in reach and all needs met.  ? ? ?   ? ?Therapy Documentation ?Precautions:  ?Precautions ?Precautions: Fall ?Precaution Comments: posey belt, Bil mitts, R inattention, trach ?Restrictions ?Weight Bearing Restrictions: No ? ?  ?Vital Signs: ?Therapy Vitals ?Temp: 98.3 ?F (36.8 ?C) ?Pulse Rate: 88 ?Resp: 20 ?BP: 110/70 ?Patient Position (if appropriate):  Lying ?Oxygen Therapy ?SpO2: 98 % ?O2 Device: Tracheostomy Collar ?O2 Flow Rate (L/min): 5 L/min ?FiO2 (%): 28 % ?Pain: ? denies ? ? ? ?Therapy/Group: Individual Therapy ? ?AuLorie Phenix4/11/2022, 5:13 PM  ?

## 2022-03-30 NOTE — Progress Notes (Signed)
Nutrition Follow-up ? ?DOCUMENTATION CODES:  ? ?Non-severe (moderate) malnutrition in context of chronic illness ? ?INTERVENTION:  ?Let pt attempt to po at meals, if po intake at that meal is <50%, then provide a bolus tube feed via PEG using Osmolite 1.5 cal formula at volume of 356 ml (1.5 cartons/ARCs) up to TID. ? ?Additionally provide a bolus feed at HS daily.  ? ?Provide 45 ml Prosource TF BID per tube.  ? ?Free water flush of 200 ml q 6 hours per tube.  ? ?If all bolus feeds are given, tube feeding regimen to provide 2216 kcal, 111 grams of protein, 1882 ml free water.  ? ?NUTRITION DIAGNOSIS:  ? ?Moderate Malnutrition related to chronic illness (stroke) as evidenced by moderate fat depletion, moderate muscle depletion.; ongoing ? ?GOAL:  ? ?Patient will meet greater than or equal to 90% of their needs; met with TF ? ?MONITOR:  ? ?PO intake, Supplement acceptance, Diet advancement, Labs, Weight trends, TF tolerance, Skin, I & O's ? ?REASON FOR ASSESSMENT:  ? ?Consult ?Enteral/tube feeding initiation and management ? ?ASSESSMENT:  ? ?61 year old male with history of previous L MCA stroke presents after witnessed seizure-like activity at home. Slurred speech and right-sided weakness noted. CTA concerning for occlusion of left ICA. Pt underwent embolectomy and balloon angioplasty on 3/15. MRI 3/17 with findings worrisome for acute left MCA territory infarcts. Percutaneous tracheostomy on 3/22.  PEG placement performed on 3/24. 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 ? ?Pt continues on trach collar. Diet has been advanced to a dysphagia 1 diet with nectar thick liquids. Meal completion 15% at dinner yesterday evening. At time of visit, pt reports no po intake at breakfast and did not want to eat lunch at time of visit. RD to modify tube feeding orders to allow pt to eat at meals and if intake if <50%, then  provide a bolus tube feed. RN and staff have been encouraging pt po intake. Labs and medications reviewed.  ? ?Diet Order:   ?Diet Order   ? ?       ?  DIET - DYS 1 Room service appropriate? Yes; Fluid consistency: Nectar Thick  Diet effective now       ?  ? ?  ?  ? ?  ? ? ?EDUCATION NEEDS:  ? ?Not appropriate for education at this time ? ?Skin:  Skin Assessment: Reviewed RN Assessment ?Skin Integrity Issues:: DTI, Other (Comment) ?DTI: R heel ?Other: R, L black 2nd toe ? ?Last BM:  4/10 ? ?Height:  ? ?Ht Readings from Last 1 Encounters:  ?03/05/22 6' (1.829 m)  ? ? ?Weight:  ? ?Wt Readings from Last 1 Encounters:  ?03/28/22 77.9 kg  ? ?BMI:  Body mass index is 23.29 kg/m?. ? ?Estimated Nutritional Needs:  ? ?Kcal:  2100-2300 ? ?Protein:  105-120 grams ? ?Fluid:  >/= 2 L/day ? ?Corrin Parker, MS, RD, LDN ?RD pager number/after hours weekend pager number on Amion. ? ?

## 2022-03-30 NOTE — Progress Notes (Signed)
?                                                       PROGRESS NOTE ? ? ?Subjective/Complaints: ? ?Appreciate CCM assist, pt tolerating PEG feeds  ? ? ?ROS: cannot obtain due to aphasia  ? ?Objective: ?  ?DG CHEST PORT 1 VIEW ? ?Result Date: 03/28/2022 ?CLINICAL DATA:  Dyspnea EXAM: PORTABLE CHEST 1 VIEW COMPARISON:  03/23/2022 FINDINGS: Emphysema. Left perihilar mass and right lung nodules are again identified. Left basilar atelectasis/consolidation. No pleural effusion or pneumothorax. Similar cardiomediastinal contours. IMPRESSION: Similar lung aeration with left perihilar mass, right lung nodules, and left basilar atelectasis/consolidation. Electronically Signed   By: Macy Mis M.D.   On: 03/28/2022 15:12   ?Recent Labs  ?  03/28/22 ?0753  ?WBC 10.0  ?HGB 14.6  ?HCT 43.5  ?PLT 269  ? ? ?Recent Labs  ?  03/28/22 ?0753  ?NA 135  ?K 4.8  ?CL 98  ?CO2 29  ?GLUCOSE 114*  ?BUN 27*  ?CREATININE 0.85  ?CALCIUM 9.0  ? ? ? ?Intake/Output Summary (Last 24 hours) at 03/30/2022 1005 ?Last data filed at 03/30/2022 0254 ?Gross per 24 hour  ?Intake 0 ml  ?Output 1800 ml  ?Net -1800 ml  ? ?  ? ?Pressure Injury 03/22/22 Heel Right;Lateral Deep Tissue Pressure Injury - Purple or maroon localized area of discolored intact skin or blood-filled blister due to damage of underlying soft tissue from pressure and/or shear. (Active)  ?03/22/22 1500  ?Location: Heel  ?Location Orientation: Right;Lateral  ?Staging: Deep Tissue Pressure Injury - Purple or maroon localized area of discolored intact skin or blood-filled blister due to damage of underlying soft tissue from pressure and/or shear.  ?Wound Description (Comments):   ?Present on Admission: Yes  ? ? ?Physical Exam: ?Vital Signs ?Blood pressure 96/65, pulse 73, temperature 98.8 ?F (37.1 ?C), temperature source Oral, resp. rate 16, weight 77.9 kg, SpO2 94 %. ? ?General: No acute distress ?Mood and affect are appropriate ?Heart: Regular rate and rhythm no rubs murmurs or extra  sounds ?Lungs: Clear to auscultation, breathing unlabored, no rales or wheezes ?Abdomen: Positive bowel sounds, soft nontender to palpation, nondistended ?Extremities: No clubbing, cyanosis, or edema ? ? ?Psych: somnolent ?Skin: ?PEG site LUQ, no drainage or tenderness  ?Extremities: No clubbing, cyanosis, or edema, IV site L arm CDI  ?Skin: No evidence of breakdown, no evidence of rash ?Neurologic: Cranial nerves II through XII intact, motor strength is 4/5 in Left , 0/5 RIght deltoid, bicep, tricep, grip, 4/5 Left and 2/5 Ri proximal hip flexor, knee extensors, ankle dorsiflexor and plantar flexor ? ?Musculoskeletal: Full range of motion in all 4 extremities. No joint swelling ? ? ?Assessment/Plan: ?1. Functional deficits which require 3+ hours per day of interdisciplinary therapy in a comprehensive inpatient rehab setting. ?Physiatrist is providing close team supervision and 24 hour management of active medical problems listed below. ?Physiatrist and rehab team continue to assess barriers to discharge/monitor patient progress toward functional and medical goals ? ?Care Tool: ? ?Bathing ?   ?Body parts bathed by patient: Face  ?   ?  ?  ?Bathing assist Assist Level: Maximal Assistance - Patient 24 - 49% (Per OT eval note for bathing both the upper and lower body parts) ?  ?  ?Upper Body Dressing/Undressing ?Upper body  dressing   ?What is the patient wearing?: [place] only ?   ?Upper body assist Assist Level: Total Assistance - Patient < 25% ?   ?Lower Body Dressing/Undressing ?Lower body dressing ? ? ?   ?What is the patient wearing?: Pants ? ?  ? ?Lower body assist Assist for lower body dressing: Total Assistance - Patient < 25% ?   ? ?Toileting ?Toileting    ?Toileting assist Assist for toileting: Dependent - Patient 0% ?  ?  ?Transfers ?Chair/bed transfer ? ?Transfers assist ?   ? ?Chair/bed transfer assist level: Dependent - mechanical lift (stedy) ?  ?  ?Locomotion ?Ambulation ? ? ?Ambulation assist ? ?    ? ?Assist level: 2 helpers ?Assistive device: Other (comment) ?Max distance: 10  ? ?Walk 10 feet activity ? ? ?Assist ?   ? ?Assist level: 2 helpers ?Assistive device: Other (comment)  ? ?Walk 50 feet activity ? ? ?Assist Walk 50 feet with 2 turns activity did not occur: Safety/medical concerns ? ?  ?   ? ? ?Walk 150 feet activity ? ? ?Assist Walk 150 feet activity did not occur: Safety/medical concerns ? ?  ?  ?  ? ?Walk 10 feet on uneven surface  ?activity ? ? ?Assist Walk 10 feet on uneven surfaces activity did not occur: Safety/medical concerns ? ? ?  ?   ? ?Wheelchair ? ? ? ? ?Assist Is the patient using a wheelchair?: Yes ?Type of Wheelchair: Manual (per PT note, TIS w/c) ?  ? ?Wheelchair assist level: Dependent - Patient 0% ?Max wheelchair distance: 150  ? ? ?Wheelchair 50 feet with 2 turns activity ? ? ? ?Assist ? ?  ?  ? ? ?Assist Level: Dependent - Patient 0%  ? ?Wheelchair 150 feet activity  ? ? ? ?Assist ?   ? ? ?Assist Level: Dependent - Patient 0%  ? ?Blood pressure 96/65, pulse 73, temperature 98.8 ?F (37.1 ?C), temperature source Oral, resp. rate 16, weight 77.9 kg, SpO2 94 %. ? ?Medical Problem List and Plan: ?1. Functional deficits secondary to left MCA stroke with right-sided weakness, slurred speech and expressive aphasia on admission ?            -patient may  shower with PMV and PEG ?            -ELOS/Goals: 4/28- min A to supervision ? Team conference today please see physician documentation under team conference tab, met with team  to discuss problems,progress, and goals. Formulized individual treatment plan based on medical history, underlying problem and comorbidities. ? ?2.  Antithrombotics: ?-DVT/anticoagulation:  Pharmaceutical: Lovenox ?            -antiplatelet therapy: Plavix and aspirin ?3. Pain Management: Tylenol prn ?4. Mood: LCSW to evaluate and provide emotional support ?            -antipsychotic agents: Seroquel 25 mg q HS ?            --Ativan prn agitation; continue  restraints for now ?5. Neuropsych: This patient is not capable of making decisions on his own behalf. ?6. Skin/Wound Care: Routine skin care checks ?            --known pressure injury versus bruise>>? Left buttock ?            --dry, ischemic ulcers bilateral toes ?7. Fluids/Electrolytes/Nutrition: Routine Is and Os and follow-up chemistries ?            --NPO ?            --  on continuous TFs at 55 cc/hr>>ask dietary to assist with conversion to TF bouses ?            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            --on Plavix and aspirin ?9: CHF: "eventually resume metoprolol 12.5 mg BID", does not appear to be in fluid overload clinically but in DDx , receiving ~1110m of fluid per day  ?10: Hypertension: continue TID monitoring ?Vitals:  ? 03/30/22 0359 03/30/22 0728  ?BP:    ?Pulse: 73 73  ?Resp: 16 16  ?Temp:    ?SpO2: 97% 94%  ?  ?11: Hyperlipidemia: continue Lipitor 40 mg daily ?12: Seizure-type activity: continue Keppra 500 mg BID ?13: Alcohol abuse: provide cessation counseling- ETOH neg on admission  ?            --continue thiamine supplementation ?14: Trach/Aspiration pneumonia and multiple intubation/extubation for airway protection ?            --s/p tracheostomy 3/22> trach capping per CCM ?            --now with cuffless [place]iley #6 with humidified air - using PMV ?            --follow-up outpatient tach clinic ?15:Dysphagia ?--s/p PEG 3/24- is NPO- needs abd binder ot protect PEG, changing to TF boluses ?16: COPD: continue BCandiss [place] Pulmicort ?            -stable, CCM following  ?17: Tobacco abuse: continue nicotine patch ?            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 PET CT examination is recommended ?for further evaluation. Associated left hilar pathologic  adenopathy. ?20: Transaminitis: follow-up CMP tomorrow ?21: Left MCA stroke 08/2021 with petechial hemorrhage on aspirin monotherapy ?22. Loose stools- due to Colace? Suggest bulking up stools at this point.  ?

## 2022-03-30 NOTE — Progress Notes (Signed)
Orthopedic Tech Progress Note ?Patient Details:  ?Todd Estrada ?[birth year] ?[id number] ? ?RN called requesting an ABDOMINAL BINDER but a larger one ? ?Ortho Devices ?Type of Ortho Device: Abdominal binder ?Ortho Device/Splint Location: STOMACH ?Ortho Device/Splint Interventions: Ordered ?  ?Post Interventions ?Patient Tolerated: Well ?Instructions Provided: Care of device ? ?Janit Pagan ?03/30/2022, 9:51 AM ? ?

## 2022-03-30 NOTE — Progress Notes (Signed)
Occupational Therapy Session Note ? ?Patient Details  ?Name: Todd Estrada ?MRN: [id number] ?Date of Birth: Jun 25, 1961 ? ?Today's Date: 03/30/2022 ?OT Individual Time: [id number] ?OT Individual Time Calculation (min): 60 min  ? ? ?Short Term Goals: ?Week 1:  OT Short Term Goal 1 (Week 1): Pt will complete 2 grooming tasks at sink with mod A. ?OT Short Term Goal 1 - Progress (Week 1): Not met ?OT Short Term Goal 2 (Week 1): Pt will complete sit to stand at sink with mod A. ?OT Short Term Goal 2 - Progress (Week 1): Not met ?OT Short Term Goal 3 (Week 1): Pt will don shirt with max A. ?OT Short Term Goal 3 - Progress (Week 1): Not met ?OT Short Term Goal 4 (Week 1): Pt will transfer to toilet with max A and LRAD. ?OT Short Term Goal 4 - Progress (Week 1): Not met ?Week 2:  OT Short Term Goal 1 (Week 2): Pt will complete 2 grooming tasks at sink with mod A. ?OT Short Term Goal 2 (Week 2): Pt will complete sit to stand at sink with mod A. ?OT Short Term Goal 3 (Week 2): Pt will don shirt with max A. ?OT Short Term Goal 4 (Week 2): Pt will transfer to toilet with max A and LRAD. ?OT Short Term Goal 5 (Week 2): Pt will participate in 30 min of OT session in 2/3 attemps. ? ?Skilled Therapeutic Interventions/Progress Updates:  ?  Pt received awake semi-reclined in bed, awake and alert and appears agreeable to therapy. Session focus on self-care retraining, activity tolerance, transfer retraining in prep for improved ADL/IADL/func mobility performance + decreased caregiver burden. Donned PSMV throughout session, doffed prior to session conclusion. Came to sitting EOB with mod A to progress BLE off bed due to poor initiation. Close S for static sitting balance. Donned pants with max A to thread BLE and pull over R hip, min A sit to stand with use of RW + R saddle splint, pt able to pull pants up past knees and over L hip. Stand-pivot > TIS on his L with overall min A, pt abadoning RW halfway through. Total A to comb hair/wash  UB/incorporate RUE functionally for self-care tasks while seated at sink due to poor initiation/receptive language deficits. Pt was able to don shirt with min A to thread RUE.  ? ?Set-up A for breakfast, pt shaking head no when asked if wanting to eat. Did drink a sip of orange juice with L hand with S after strong encouragement, but continues to declining eating breakfast. ? ?SatO2 at 95% + on 5L, 28% fiO2 via trach collar throughout session. HR at 75 - 101 bpm throughout session.  ? ?Pt left tilted back in TIS, abdominal binder/B soft wrist restrains and mits donned with safety belt alarm engaged, call bell in reach, and all immediate needs met.  ? ? ?Therapy Documentation ?Precautions:  ?Precautions ?Precautions: Fall ?Precaution Comments: posey belt, Bil mitts, R inattention, trach ?Restrictions ?Weight Bearing Restrictions: No ? ?Pain: ? No s/sx throughout ?ADL: See Care Tool for more details. ? ? ?Therapy/Group: Individual Therapy ? ?Volanda Napoleon MS, OTR/L ? ?03/30/2022, 6:51 AM ?

## 2022-03-31 NOTE — Progress Notes (Signed)
Physical Therapy Session Note ? ?Patient Details  ?Name: Todd Estrada ?MRN: [id number] ?Date of Birth: 1961-11-24 ? ?Today's Date: 03/31/2022 ?PT Individual Time: 6160-7371 ?PT Individual Time Calculation (min): 54 min  ? ?Short Term Goals: ?Week 1:  PT Short Term Goal 1 (Week 1): pt will trasnfer to [facility] with min assist ?PT Short Term Goal 2 (Week 1): Pt will consistently perform bed mobility with min assist ?PT Short Term Goal 3 (Week 1): Pt will ambulate 26ft with mod A +2 ? ? ?Skilled Therapeutic Interventions/Progress Updates:  ? ?Pt received supine in bed. Noted tea colored urea. Spoke with RN and MD; cleared pt for PT. Pt agreeable to PT. Supine>sit transfer with min assist and cues for attention to the RLE. Sit<>Stand from EOB with min assist for balance. Pt able to pull pants to waist in standing but total A for management of clothing to don over bil feet.  ? ?Stand pivot transfer to [facility] with min assist and then transfer to nustep with min assist on the R. Nustep reciprocal movement training x 5 in with multiple seated rest breaks to assess BP.  ?Prior to endurance training: 105/75.  ?1 min: 107/78, HR 85.  ?2.5 min 107/78, HR 85.  ?5 min 94/68, HR 82. Pt mildly symptomatic.  ? ?Stand pivot transfer to [facility] with min assist. Pt reclined in TIS WC. 104/63 HR 90.  ?SpO2 100%. ? ?Pt transported to room. PT applied bil mits and wrist restraints applies. Left sitting in WC with call bell in reach.  ?   ? ?Therapy Documentation ?Precautions:  ?Precautions ?Precautions: Fall ?Precaution Comments: posey belt, Bil mitts, R inattention, trach ?Restrictions ?Weight Bearing Restrictions: No ? ?Vital Signs: ?Therapy Vitals ?Pulse Rate: 72 ?Resp: 18 ?Patient Position (if appropriate): Lying ?Oxygen Therapy ?SpO2: 97 % ?O2 Device: Tracheostomy Collar ?O2 Flow Rate (L/min): 5 L/min ?FiO2 (%): 28 % ?Pain: ?Pain Assessment ?Pain Scale: 0-10 ?Pain Score: 0-No pain ? ? ? ? ?Therapy/Group: Individual Therapy ? ?Lorie Phenix ?03/31/2022, 11:02 AM  ?

## 2022-03-31 NOTE — Patient Care Conference (Signed)
Inpatient RehabilitationTeam Conference and Plan of Care Update ?Date: 03/30/2022   Time: 10:05 AM  ? ? ?Patient Name: Todd Estrada      ?Medical Record Number: [id number]  ?Date of Birth: [birth year] ?Sex: Male         ?Room/Bed: 4W11C/4W11C-01 ?Payor Info: Payor: [place] / Plan: [place] MEDICA[employer] [place]  / Product Type: *No Product type* /   ? ?Admit Date/Time:  03/22/2022  2:44 PM ? ?Primary Diagnosis:  Acute ischemic left MCA stroke ([place]) ? ?Hospital Problems: Principal Problem: ?  Acute ischemic left MCA stroke ([place]) ?Active Problems: ?  Chronic respiratory failure with hypoxia (HCC) ? ? ? ?Expected Discharge Date: Expected Discharge Date: 04/15/22 ? ?Team Members Present: ?Physician leading conference: Dr. Alysia Penna ?Social Worker Present: Erlene Quan, BSW ?Nurse Present: Dorien Chihuahua, RN ?PT Present: Barrie Folk, PT ?OT Present: Providence Lanius, OT ?SLP Present: Sherren Kerns, SLP ?PPS Coordinator present : Gunnar Fusi, SLP ? ?   Current Status/Progress Goal Weekly Team Focus  ?Bowel/Bladder ? ? incontinent to bowel and bladder last bm 4/11         ?Swallow/Nutrition/ Hydration ? ? Diet initiated on 4/11. Dys 1, nectar thick liquids  sup A  Tolerance of current diet   ?ADL's ? ? total A for UB/LB bathing/dressing, grooming, min A UBD, can self-feed with S, but declined this am, stand-pivot with RW and mod A; missed lots of therapy time due to lethargy/declining/fatigue ; RUE / hand brummstrom level II  downgraded to mod A ADL and bathroom transfers due to poor therapy participation  RUE NMR, transfer/balance/self-care retraining, func cog, pt/family/DME/AE education   ?Mobility ? ? min-mod assist bed mobility. min-mod assist trasnfers to and from [facility]. max +2 assist gait for safety  Min assist gait. supervision assist bed mobility. CGA trasnfers to gait.  improved arousal. balance. standing tolerance. NMR for the R side.   ?Communication ? ? Max A for  vocalization though unintelligible and Mod-Max A for response to yes/no questions and utilzing multimodal communication system.  Comprehension of basic auditory information - Min A; Verbal expression of wants/needs - Min A; Increase speech intelligibility - Min A  Vocalization or rote speech with PMSV, multimodal communication, yes/no responses, assess reading and writing skills for communication purposes   ?Safety/Cognition/ Behavioral Observations ? Max to Total A  Sustianed attention - Min A; Orientation to person, place, time, and situation - Supervision  Placement of signage to aid in recall of place, situation, and time   ?Pain ? ? denies pain  denies pain      ?Skin ? ? right foot dti wound, offload and boots when in bed, sacral stage 2  wound to show signs of healing      ? ? ?Discharge Planning:  ?patient discharging back home with his cousin able to provide some assistance and 24/7 supervision   ?Team Discussion: ?Patient post mucous plug in trach; Md adjusted medications for secretions and capping trials. Patient is not fond of PMSV trials. UTI treated; working on toileting and continence management.  ?Patient on target to meet rehab goals: ?yes, patient is more engaged in therapy this week. Able to complete upper body care with min assist and lower body care with max assist. Completes stand pivot with mi - mod assist and total assist for toileting and bathing. Able to self feed with supervision due to right upper extremity weakness. Completes squat pivot transfers with min-mod assist and able to  ambulate with max assist. Taking D1 nectar thick liquid with max encouragement. Speech unintelligible except for yes/no. Uses gestures and nods to communicate. Declines picture system trial.  ? ?*See Care Plan and progress notes for long and short-term goals.  ? ?Revisions to Treatment Plan:  ?Upgraded goals to CGA - supervision level ?Gait trials ?Capping trach trials ?Downgraded SLP goals to mod assist ?   ?Teaching Needs: ?Safety, nutrition management, medication management, dietary modifications, transfers, toileting, etc  ?Current Barriers to Discharge: ?Decreased caregiver support, Home enviroment access/layout, Trach, Incontinence, and Nutritional means and lack of insurance coverage for SNF ? ?Possible Resolutions to Barriers: ?Family education with cousin ?  ? ? Medical Summary ?Current Status: Aphasia, dysphagia persist, trach plugged 2 d ago, CCM following closely ? Barriers to Discharge: Medical stability;Trach ?  ?Possible Resolutions to [employer]: may have decannulation goals with help of CCM ? ? ?Continued Need for Acute Rehabilitation Level of Care: The patient requires daily medical management by a physician with specialized training in physical medicine and rehabilitation for the following reasons: ?Direction of a multidisciplinary physical rehabilitation program to maximize functional independence : Yes ?Medical management of patient stability for increased activity during participation in an intensive rehabilitation regime.: Yes ?Analysis of laboratory values and/or radiology reports with any subsequent need for medication adjustment and/or medical intervention. : Yes ? ? ?I attest that I was present, lead the team conference, and concur with the assessment and plan of the team. ? ? ?Dorien Chihuahua B ?03/31/2022, 10:29 AM  ? ? ? ? ? ? ?

## 2022-03-31 NOTE — Progress Notes (Signed)
Speech Language Pathology Daily Session Note ? ?Patient Details  ?Name: Todd Estrada ?MRN: [id number] ?Date of Birth: Jun 23, 1961 ? ?Today's Date: 03/31/2022 ?SLP Individual Time: 8115-7262 ?SLP Individual Time Calculation (min): 45 min ? ?Short Term Goals: ?Week 2: SLP Short Term Goal 1 (Week 2): Patient will respond to basic yes/no questions in regards to biographical and environmental information with mod A verbal/visual cues to achieve 75% accuracy ?SLP Short Term Goal 2 (Week 2): Patient will consistently tolerate PMSV as evidenced by SpO2>92% for 30 minute duration ?SLP Short Term Goal 3 (Week 2): Patient will consume current diet with minimal overt s/sx of aspiration and min A cues for implementation of safe swallowing precautions and strategies ?SLP Short Term Goal 4 (Week 2): Patient will be oriented to person/place/time/situation with min A cues for use of external aids ?SLP Short Term Goal 5 (Week 2): Pt will communicate basic wants/needs via multimodal communication and mod A cues ?SLP Short Term Goal 6 (Week 2): Patient will identify at the word level given field of 3 written choices with mod A to achieve 75% accuracy ? ?Skilled Therapeutic Interventions: Skilled ST treatment focused on communication and swallowing goals. Pt received in wheelchair on arrival, awake but appeared lethargic and with difficulty sustaining attention for >1 minute intervals. Pt declined PO trials during session. Agreeable to SLP donning PMSV. O2 saturations remained consistent at 94-95% with PMSV in place; same when PMSV was not in place. Pulse was slightly higher today reading ~92bpm during session w/ and w/out PMSV. SLP facilitated object naming using common objects from [employer]. Pt approximated the sounds + words: "hammer", /b/ for "brush", /m/ for "mirror", /p/ for "pen", and "spoon" with ~25% intelligibility; pt with reduced vocal intensity and mildly rough vocal quality. Pt pointed to field of three written words (same  objects from [place] used for object naming) with 70% accuracy given min A verbal/visual cues. PMSV doffed at end of session. Pt tolerated for overall 35 minute duration with O2 sats above 94%. Pt communicated needs regarding comfort in wheelchair via yes/no head nods and sup A verbal cues for clarity. Patient was left in TIS chair with soft mitts and wrist restraints in place, alarm activated, and all immediate needs met at end of session session. Continue per current plan of care.   ?   ?Pain ?Pain Assessment ?Pain Scale: 0-10 ?Pain Score: 0-No pain ? ?Therapy/Group: Individual Therapy ? ?Myrna Vonseggern T Raiford Fetterman ?03/31/2022, 2:01 PM ?

## 2022-03-31 NOTE — Progress Notes (Signed)
Patient working with OT when RT went to assess trach. Patient's trach was patent. RT will reassess again this afternoon.  ?

## 2022-03-31 NOTE — Progress Notes (Signed)
?                                                       PROGRESS NOTE ? ? ?Subjective/Complaints: ? ?Appreciate CCM assist, pt tolerating PEG feeds  ?Hematuria noted in bag, has condom cath which pt removes freq, pt has had skin irritation  ? ? ?ROS: cannot obtain due to aphasia  ? ?Objective: ?  ?No results found. ?No results for input(s): WBC, HGB, HCT, PLT in the last 72 hours. ? ?No results for input(s): NA, K, CL, CO2, GLUCOSE, BUN, CREATININE, CALCIUM in the last 72 hours. ? ? ?Intake/Output Summary (Last 24 hours) at 03/31/2022 0920 ?Last data filed at 03/31/2022 3244 ?Gross per 24 hour  ?Intake 150 ml  ?Output 600 ml  ?Net -450 ml  ? ?  ? ?Pressure Injury 03/22/22 Heel Right;Lateral Deep Tissue Pressure Injury - Purple or maroon localized area of discolored intact skin or blood-filled blister due to damage of underlying soft tissue from pressure and/or shear. (Active)  ?03/22/22 1500  ?Location: Heel  ?Location Orientation: Right;Lateral  ?Staging: Deep Tissue Pressure Injury - Purple or maroon localized area of discolored intact skin or blood-filled blister due to damage of underlying soft tissue from pressure and/or shear.  ?Wound Description (Comments):   ?Present on Admission: Yes  ? ? ?Physical Exam: ?Vital Signs ?Blood pressure 105/70, pulse 72, temperature 98 ?F (36.7 ?C), temperature source Oral, resp. rate 18, weight 77.9 kg, SpO2 97 %. ? ?General: No acute distress ?Mood and affect are appropriate ?Heart: Regular rate and rhythm no rubs murmurs or extra sounds ?Lungs: Clear to auscultation, breathing unlabored, no rales or wheezes ?Abdomen: Positive bowel sounds, soft nontender to palpation, nondistended ?Extremities: No clubbing, cyanosis, or edema ? ? ?Psych: somnolent ?Skin: ?PEG site LUQ, no drainage or tenderness  ?Extremities: No clubbing, cyanosis, or edema, IV site L arm CDI  ?Skin: No evidence of breakdown, no evidence of rash ?Neurologic: Cranial nerves II through XII intact, motor  strength is 4/5 in Left , 0/5 RIght deltoid, bicep, tricep, grip, 4/5 Left and 2/5 Ri proximal hip flexor, knee extensors, ankle dorsiflexor and plantar flexor ? ?Musculoskeletal: Full range of motion in all 4 extremities. No joint swelling ? ? ?Assessment/Plan: ?1. Functional deficits which require 3+ hours per day of interdisciplinary therapy in a comprehensive inpatient rehab setting. ?Physiatrist is providing close team supervision and 24 hour management of active medical problems listed below. ?Physiatrist and rehab team continue to assess barriers to discharge/monitor patient progress toward functional and medical goals ? ?Care Tool: ? ?Bathing ?   ?Body parts bathed by patient: Right arm, Left arm, Chest, Abdomen  ?   ?  ?  ?Bathing assist Assist Level: Total Assistance - Patient < 25% ?  ?  ?Upper Body Dressing/Undressing ?Upper body dressing   ?What is the patient wearing?: Pull over shirt ?   ?Upper body assist Assist Level: Minimal Assistance - Patient > 75% ?   ?Lower Body Dressing/Undressing ?Lower body dressing ? ? ?   ?What is the patient wearing?: Pants ? ?  ? ?Lower body assist Assist for lower body dressing: Maximal Assistance - Patient 25 - 49% ?   ? ?Toileting ?Toileting    ?Toileting assist Assist for toileting: Dependent - Patient 0% ?  ?  ?Transfers ?Chair/bed  transfer ? ?Transfers assist ?   ? ?Chair/bed transfer assist level: Dependent - mechanical lift (stedy) ?  ?  ?Locomotion ?Ambulation ? ? ?Ambulation assist ? ?   ? ?Assist level: 2 helpers ?Assistive device: Other (comment) ?Max distance: 10  ? ?Walk 10 feet activity ? ? ?Assist ?   ? ?Assist level: 2 helpers ?Assistive device: Other (comment)  ? ?Walk 50 feet activity ? ? ?Assist Walk 50 feet with 2 turns activity did not occur: Safety/medical concerns ? ?  ?   ? ? ?Walk 150 feet activity ? ? ?Assist Walk 150 feet activity did not occur: Safety/medical concerns ? ?  ?  ?  ? ?Walk 10 feet on uneven surface  ?activity ? ? ?Assist Walk  10 feet on uneven surfaces activity did not occur: Safety/medical concerns ? ? ?  ?   ? ?Wheelchair ? ? ? ? ?Assist Is the patient using a wheelchair?: Yes ?Type of Wheelchair: Manual (per PT note, TIS w/c) ?  ? ?Wheelchair assist level: Dependent - Patient 0% ?Max wheelchair distance: 150  ? ? ?Wheelchair 50 feet with 2 turns activity ? ? ? ?Assist ? ?  ?  ? ? ?Assist Level: Dependent - Patient 0%  ? ?Wheelchair 150 feet activity  ? ? ? ?Assist ?   ? ? ?Assist Level: Dependent - Patient 0%  ? ?Blood pressure 105/70, pulse 72, temperature 98 ?F (36.7 ?C), temperature source Oral, resp. rate 18, weight 77.9 kg, SpO2 97 %. ? ?Medical Problem List and Plan: ?1. Functional deficits secondary to left MCA stroke with right-sided weakness, slurred speech and expressive aphasia on admission ?            -patient may  shower with PMV and PEG ?            -ELOS/Goals: 4/28- min A to supervision ?  ? ?2.  Antithrombotics: ?-DVT/anticoagulation:  Pharmaceutical: Lovenox stopped due to hematuria, use SCDs  ?            -antiplatelet therapy: Plavix and aspirin ?3. Pain Management: Tylenol prn ?4. Mood: LCSW to evaluate and provide emotional support ?            -antipsychotic agents: Seroquel 25 mg q HS ?            --Ativan prn agitation; continue restraints for now ?5. Neuropsych: This patient is not capable of making decisions on his own behalf. ?6. Skin/Wound Care: Routine skin care checks ?            --known pressure injury versus bruise>>? Left buttock ?            --dry, ischemic ulcers bilateral toes ?7. Fluids/Electrolytes/Nutrition: Routine Is and Os and follow-up chemistries ?            --NPO ?            --on continuous TFs at 55 cc/hr>>ask dietary to assist with conversion to TF bouses ?            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            --on Plavix and aspirin ?9: CHF: "eventually resume metoprolol 12.5 mg BID", does not appear to be in fluid overload  clinically but in DDx , receiving ~1161ml of fluid per day  ?10: Hypertension: continue TID monitoring ?Vitals:  ? 03/31/22 0811 03/31/22 2542  ?BP:    ?  Pulse:  72  ?Resp:  18  ?Temp:    ?SpO2: 97% 97%  ?  ?11: Hyperlipidemia: continue Lipitor 40 mg daily ?12: Seizure-type activity: continue Keppra 500 mg BID ?13: Alcohol abuse: provide cessation counseling- ETOH neg on admission  ?            --continue thiamine supplementation ?14: Trach/Aspiration pneumonia and multiple intubation/extubation for airway protection ?            --s/p tracheostomy 3/22> trach capping per CCM ?            --now with cuffless Shiley #6 with humidified air - using PMV ?            --follow-up outpatient tach clinic ?15:Dysphagia ?--s/p PEG 3/24- is NPO- needs abd binder ot protect PEG, changing to TF boluses ?16: COPD: continue Candiss Norse and Pulmicort ?            -stable, CCM following  ?17: Tobacco abuse: continue nicotine patch ?            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 PET CT examination is recommended ?for further evaluation. Associated left hilar pathologic adenopathy. ?20: Transaminitis: follow-up CMP tomorrow ?21: Left MCA stroke 08/2021 with petechial hemorrhage on aspirin monotherapy ?22. Loose stools- due to Colace? Suggest bulking up stools at this point.  ? 23.  Leukocytosis post PNA- cont to monitor - remains afebrile but at risk for UTI or PNA  ?  ? 4/9: Keflex started on 4/8 for >100,000 E coli in UC, is sens to Keflex  ? ?  Latest Ref Rng & Units 03/28/2022  ?  7:53 AM 03/26/2022  ?  2:27 PM 03/23/2022  ?  5:16 AM  ?CBC  ?WBC 4.0 - 10.5 K/uL 10.0   9.0   14.9    ?Hemoglobin 13.0 - 17.0 g/dL 14.6   15.0   13.9    ?Hematocrit 39.0 - 52.0 % 43.5   45.5   42.6    ?Platelets 150 - 400 K/uL 269   321   410    ?  ?24.  PAD RIght SFA  occlusion VVS eval appreciated, ABI reviewed and shows moderate to severe arterial disease no clincal signs of critical limb ischemia,  cannot express other symptoms such as claudication due to aphasia following CVA, n

## 2022-03-31 NOTE — Progress Notes (Signed)
Occupational Therapy Session Note ? ?Patient Details  ?Name: Todd Estrada ?MRN: [id number] ?Date of Birth: Aug 04, 1961 ? ?Today's Date: 03/31/2022 ?OT Individual Time: 1599-6895 ?OT Individual Time Calculation (min): 39 min  ? ? ?Short Term Goals: ?Week 1:  OT Short Term Goal 1 (Week 1): Pt will complete 2 grooming tasks at sink with mod A. ?OT Short Term Goal 1 - Progress (Week 1): Not met ?OT Short Term Goal 2 (Week 1): Pt will complete sit to stand at sink with mod A. ?OT Short Term Goal 2 - Progress (Week 1): Not met ?OT Short Term Goal 3 (Week 1): Pt will don shirt with max A. ?OT Short Term Goal 3 - Progress (Week 1): Not met ?OT Short Term Goal 4 (Week 1): Pt will transfer to toilet with max A and LRAD. ?OT Short Term Goal 4 - Progress (Week 1): Not met ?Week 2:  OT Short Term Goal 1 (Week 2): Pt will complete 2 grooming tasks at sink with mod A. ?OT Short Term Goal 2 (Week 2): Pt will complete sit to stand at sink with mod A. ?OT Short Term Goal 3 (Week 2): Pt will don shirt with max A. ?OT Short Term Goal 4 (Week 2): Pt will transfer to toilet with max A and LRAD. ?OT Short Term Goal 5 (Week 2): Pt will participate in 30 min of OT session in 2/3 attemps. ? ?Skilled Therapeutic Interventions/Progress Updates:  ?  Pt received seated in TIS, no s/sx pain, agreeable to therapy. Session focus on self-care retraining, activity tolerance, transfer retraining in prep for improved ADL/IADL/func mobility performance + decreased caregiver burden. ? ?Pt doffed shirt with mod A to pull over head and to remove RUE. Donned new shirt with min A to thread RUE. When instructed to brush his hair pt initially using it as a toothbrush and then only brushing his beard. Per nursing, pt with difficulty self-feeding due to mustache being too long. Pt responded "yeah" when asked to trim his mustache to improve self-feeding performance, total A to complete.  ? ?Stand-pivot to and from BSC/toilet with overall light max A due to  impulsivity and to progress RLE, +2 present for safety. Total A + 2 for toileting task, found to have already been incontinent of BM but had further void. ? ?SatO2 at 95%+ on 4-5L throughout session via trach collar. ? ?Pt left tilted back in TIS with safety belt alarm engaged, call bell in reach, and all immediate needs met.  ? ? ?Therapy Documentation ?Precautions:  ?Precautions ?Precautions: Fall ?Precaution Comments: posey belt, Bil mitts, R inattention, trach ?Restrictions ?Weight Bearing Restrictions: No ? ?Pain: ? No s/sx throughout ?ADL: See Care Tool for more details. ? ?Therapy/Group: Individual Therapy ? ?Volanda Napoleon MS, OTR/L ? ?03/31/2022, 6:49 AM ?

## 2022-03-31 NOTE — Progress Notes (Addendum)
Pt is noted to have dark tea colored urine with small blood clots. Hematuria noted.  ? ?Dr Read Drivers notifed, will place order to DC Lovenox  and will continue to monitor ?

## 2022-04-01 LAB — CBC WITH DIFFERENTIAL/PLATELET
Abs Immature Granulocytes: 0.04 10*3/uL (ref 0.00–0.07)
Basophils Absolute: 0.1 10*3/uL (ref 0.0–0.1)
Basophils Relative: 1 %
Eosinophils Absolute: 0.4 10*3/uL (ref 0.0–0.5)
Eosinophils Relative: 4 %
HCT: 39.3 % (ref 39.0–52.0)
Hemoglobin: 13.2 g/dL (ref 13.0–17.0)
Immature Granulocytes: 0 %
Lymphocytes Relative: 23 %
Lymphs Abs: 2.3 10*3/uL (ref 0.7–4.0)
MCH: 30.3 pg (ref 26.0–34.0)
MCHC: 33.6 g/dL (ref 30.0–36.0)
MCV: 90.1 fL (ref 80.0–100.0)
Monocytes Absolute: 0.7 10*3/uL (ref 0.1–1.0)
Monocytes Relative: 6 %
Neutro Abs: 6.6 10*3/uL (ref 1.7–7.7)
Neutrophils Relative %: 66 %
Platelets: 254 10*3/uL (ref 150–400)
RBC: 4.36 MIL/uL (ref 4.22–5.81)
RDW: 13 % (ref 11.5–15.5)
WBC: 10.1 10*3/uL (ref 4.0–10.5)
nRBC: 0 % (ref 0.0–0.2)

## 2022-04-01 NOTE — Progress Notes (Signed)
Occupational Therapy Session Note ? ?Patient Details  ?Name: Todd Estrada ?MRN: [id number] ?Date of Birth: 02-14-1961 ? ?Today's Date: 04/01/2022 ?OT Individual Time: 2725-3664 ?OT Individual Time Calculation (min): 53 min  ? ? ?Short Term Goals: ?Week 1:  OT Short Term Goal 1 (Week 1): Pt will complete 2 grooming tasks at sink with mod A. ?OT Short Term Goal 1 - Progress (Week 1): Not met ?OT Short Term Goal 2 (Week 1): Pt will complete sit to stand at sink with mod A. ?OT Short Term Goal 2 - Progress (Week 1): Not met ?OT Short Term Goal 3 (Week 1): Pt will don shirt with max A. ?OT Short Term Goal 3 - Progress (Week 1): Not met ?OT Short Term Goal 4 (Week 1): Pt will transfer to toilet with max A and LRAD. ?OT Short Term Goal 4 - Progress (Week 1): Not met ?Week 2:  OT Short Term Goal 1 (Week 2): Pt will complete 2 grooming tasks at sink with mod A. ?OT Short Term Goal 2 (Week 2): Pt will complete sit to stand at sink with mod A. ?OT Short Term Goal 3 (Week 2): Pt will don shirt with max A. ?OT Short Term Goal 4 (Week 2): Pt will transfer to toilet with max A and LRAD. ?OT Short Term Goal 5 (Week 2): Pt will participate in 30 min of OT session in 2/3 attemps. ? ?Skilled Therapeutic Interventions/Progress Updates:  ?  Pt received awake semi-reclined in bed, no s/sx pain, agreeable to therapy. Session focus on self-care retraining, activity tolerance, RUE NMR, transfer retraining in prep for improved ADL/IADL/func mobility performance + decreased caregiver burden. ? ?Came to sitting EOB with min A to progress RLE, pt much improved impulsivity waiting for therapist to cue him. Partial stand-pivot to his L with overall min A. Donned pants with max A to thread RLE and to pull over B hips. Noted to have saturated brief. Pt stood at sink with overall min A, + 2 present to assist changing out brief/performing pericare. Pt able to brush hair this date with only min A for thoroughness. Declined further need for ADL. Total A  TIS transport to and from gym. ? ?Sit to stand x4 at high low table with overall min A, +2 present for safety as pt is quick to fatigue. Completed 2x10 modified push-ups standing and while seated 2x10 forward towel slides. Pt required seated rest break between each sets due to fatigue and pt declining to stand for final two steps. ? ?SatO2 at 91% + throughout session on 4-5L via trach collar. PSMV donned throughout session, removed at session conclusion although pt minimally verbal. ? ?Pt left tilted back in TIS with B mits donned with safety belt alarm engaged, call bell in reach, and all immediate needs met.  ? ? ?Therapy Documentation ?Precautions:  ?Precautions ?Precautions: Fall ?Precaution Comments: posey belt, Bil mitts, R inattention, trach ?Restrictions ?Weight Bearing Restrictions: No ? ?Pain: no s/sx throughout ?  ?ADL: See Care Tool for more details. ?Therapy/Group: Individual Therapy ? ?Volanda Napoleon MS, OTR/L ? ?04/01/2022, 6:53 AM ?

## 2022-04-01 NOTE — Progress Notes (Signed)
Pt rested well through out the night. No obvious signs of distress noted at this time. Pts trach was suctioned and gauze changed; pt tolerated well. ?

## 2022-04-01 NOTE — Progress Notes (Signed)
?                                                       PROGRESS NOTE ? ? ?Subjective/Complaints: ? ?Appreciate CCM assist, pt tolerating PEG feeds  ?Hematuria noted in bag, has condom cath which pt removes freq, pt has had skin irritation  ?Repeat CBC reviewed ? ?ROS: cannot obtain due to aphasia  ? ?Objective: ?  ?No results found. ?Recent Labs  ?  04/01/22 ?0533  ?WBC 10.1  ?HGB 13.2  ?HCT 39.3  ?PLT 254  ? ? ?No results for input(s): NA, K, CL, CO2, GLUCOSE, BUN, CREATININE, CALCIUM in the last 72 hours. ? ? ?Intake/Output Summary (Last 24 hours) at 04/01/2022 1000 ?Last data filed at 03/31/2022 1300 ?Gross per 24 hour  ?Intake 100 ml  ?Output --  ?Net 100 ml  ? ?  ? ?Pressure Injury 03/22/22 Heel Right;Lateral Deep Tissue Pressure Injury - Purple or maroon localized area of discolored intact skin or blood-filled blister due to damage of underlying soft tissue from pressure and/or shear. (Active)  ?03/22/22 1500  ?Location: Heel  ?Location Orientation: Right;Lateral  ?Staging: Deep Tissue Pressure Injury - Purple or maroon localized area of discolored intact skin or blood-filled blister due to damage of underlying soft tissue from pressure and/or shear.  ?Wound Description (Comments):   ?Present on Admission: Yes  ? ? ?Physical Exam: ?Vital Signs ?Blood pressure 102/68, pulse 64, temperature 98.1 ?F (36.7 ?C), resp. rate 13, weight 77.9 kg, SpO2 95 %. ? ?General: No acute distress ?Mood and affect are appropriate ?Heart: Regular rate and rhythm no rubs murmurs or extra sounds ?Lungs: Clear to auscultation, breathing unlabored, no rales or wheezes ?Abdomen: Positive bowel sounds, soft nontender to palpation, nondistended ?Extremities: No clubbing, cyanosis, or edema ? ? ?Psych: somnolent ?Skin: ?PEG site LUQ, no drainage or tenderness  ?Extremities: No clubbing, cyanosis, or edema, IV site L arm CDI  ?Skin: No evidence of breakdown, no evidence of rash ?Neurologic: Cranial nerves II through XII intact, motor  strength is 4/5 in Left , 0/5 RIght deltoid, bicep, tricep, grip, 4/5 Left and 2/5 Ri proximal hip flexor, knee extensors, ankle dorsiflexor and plantar flexor ? ?Musculoskeletal: Full range of motion in all 4 extremities. No joint swelling ? ? ?Assessment/Plan: ?1. Functional deficits which require 3+ hours per day of interdisciplinary therapy in a comprehensive inpatient rehab setting. ?Physiatrist is providing close team supervision and 24 hour management of active medical problems listed below. ?Physiatrist and rehab team continue to assess barriers to discharge/monitor patient progress toward functional and medical goals ? ?Care Tool: ? ?Bathing ?   ?Body parts bathed by patient: Right arm, Left arm, Chest, Abdomen  ?   ?  ?  ?Bathing assist Assist Level: Total Assistance - Patient < 25% ?  ?  ?Upper Body Dressing/Undressing ?Upper body dressing   ?What is the patient wearing?: Pull over shirt ?   ?Upper body assist Assist Level: Minimal Assistance - Patient > 75% ?   ?Lower Body Dressing/Undressing ?Lower body dressing ? ? ?   ?What is the patient wearing?: Pants ? ?  ? ?Lower body assist Assist for lower body dressing: Maximal Assistance - Patient 25 - 49% ?   ? ?Toileting ?Toileting    ?Toileting assist Assist for toileting: 2 Helpers ?  ?  ?  Transfers ?Chair/bed transfer ? ?Transfers assist ?   ? ?Chair/bed transfer assist level: Dependent - mechanical lift (stedy) ?  ?  ?Locomotion ?Ambulation ? ? ?Ambulation assist ? ?   ? ?Assist level: 2 helpers ?Assistive device: Other (comment) ?Max distance: 10  ? ?Walk 10 feet activity ? ? ?Assist ?   ? ?Assist level: 2 helpers ?Assistive device: Other (comment)  ? ?Walk 50 feet activity ? ? ?Assist Walk 50 feet with 2 turns activity did not occur: Safety/medical concerns ? ?  ?   ? ? ?Walk 150 feet activity ? ? ?Assist Walk 150 feet activity did not occur: Safety/medical concerns ? ?  ?  ?  ? ?Walk 10 feet on uneven surface  ?activity ? ? ?Assist Walk 10 feet on  uneven surfaces activity did not occur: Safety/medical concerns ? ? ?  ?   ? ?Wheelchair ? ? ? ? ?Assist Is the patient using a wheelchair?: Yes ?Type of Wheelchair: Manual (per PT note, TIS w/c) ?  ? ?Wheelchair assist level: Dependent - Patient 0% ?Max wheelchair distance: 150  ? ? ?Wheelchair 50 feet with 2 turns activity ? ? ? ?Assist ? ?  ?  ? ? ?Assist Level: Dependent - Patient 0%  ? ?Wheelchair 150 feet activity  ? ? ? ?Assist ?   ? ? ?Assist Level: Dependent - Patient 0%  ? ?Blood pressure 102/68, pulse 64, temperature 98.1 ?F (36.7 ?C), resp. rate 13, weight 77.9 kg, SpO2 95 %. ? ?Medical Problem List and Plan: ?1. Functional deficits secondary to left MCA stroke with right-sided weakness, slurred speech and expressive aphasia on admission ?            -patient may  shower with PMV and PEG ?            -ELOS/Goals: 4/28- min A to supervision ?  ? ?2.  Antithrombotics: ?-DVT/anticoagulation:  Pharmaceutical: Lovenox stopped due to hematuria, use SCDs  ?            -antiplatelet therapy: Plavix and aspirin ?3. Pain Management: Tylenol prn ?4. Mood: LCSW to evaluate and provide emotional support ?            -antipsychotic agents: Seroquel 25 mg q HS ?            --Ativan prn agitation; continue restraints for now ?5. Neuropsych: This patient is not capable of making decisions on his own behalf. ?6. Skin/Wound Care: Routine skin care checks ?            --known pressure injury versus bruise>>? Left buttock ?            --dry, ischemic ulcers bilateral toes ?7. Fluids/Electrolytes/Nutrition: Routine Is and Os and follow-up chemistries ?            --NPO ?            --on continuous TFs at 55 cc/hr>>ask dietary to assist with conversion to TF bouses ?            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            --on Plavix and aspirin ?9: CHF: "eventually resume metoprolol 12.5 mg BID", does not appear to be in fluid overload clinically but in DDx , receiving  ~1168ml of fluid per day  ?10: Hypertension: continue TID monitoring ?Vitals:  ? 04/01/22 0717 04/01/22 0722  ?BP:    ?  Pulse:  64  ?Resp:  13  ?Temp:    ?SpO2: 95% 95%  ?  ?11: Hyperlipidemia: continue Lipitor 40 mg daily ?12: Seizure-type activity: continue Keppra 500 mg BID ?13: Alcohol abuse: provide cessation counseling- ETOH neg on admission  ?            --continue thiamine supplementation ?14: Trach/Aspiration pneumonia and multiple intubation/extubation for airway protection ?            --s/p tracheostomy 3/22> trach capping per CCM ?            --now with cuffless Shiley #6 with humidified air - using PMV ?             ?15:Dysphagia ?--s/p PEG 3/24- is NPO- needs abd binder ot protect PEG, changing to TF boluses ?16: COPD: continue Candiss Norse and Pulmicort ?            -stable, CCM following  ?17: Tobacco abuse: continue nicotine patch ?            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 PET CT examination is recommended ?for further evaluation. Associated left hilar pathologic adenopathy. ?20: Transaminitis: follow-up CMP tomorrow ?21: Left MCA stroke 08/2021 with petechial hemorrhage on aspirin monotherapy ?22. Loose stools- due to Colace? Suggest bulking up stools at this point.  ? 23.  Leukocytosis post PNA- cont to monitor - remains afebrile but at risk for UTI or PNA  ?  ? 4/9: Keflex started on 4/8 for >100,000 E coli in UC, is sens to Keflex  ? ?  Latest Ref Rng & Units 04/01/2022  ?  5:33 AM 03/28/2022  ?  7:53 AM 03/26/2022  ?  2:27 PM  ?CBC  ?WBC 4.0 - 10.5 K/uL 10.1   10.0   9.0    ?Hemoglobin 13.0 - 17.0 g/dL 13.2   14.6   15.0    ?Hematocrit 39.0 - 52.0 % 39.3   43.5   45.5    ?Platelets 150 - 400 K/uL 254   269   321    ?  ?24.  PAD RIght SFA occlusion VVS eval appreciated, ABI reviewed and shows moderate to severe  arterial disease no clincal signs of critical limb ischemia,  cannot express other symptoms such as claudication due to aphasia following CVA, needs OP f/u with VVS  ?25. Lethargy: multifactorial, Likely due to

## 2022-04-01 NOTE — Progress Notes (Signed)
Patient seen today by trach team for consult.  No education is needed at this time.  All necessary equipment is at beside.   Will continue to follow for progression.  

## 2022-04-01 NOTE — Progress Notes (Signed)
Physical Therapy Weekly Progress Note ? ?Patient Details  ?Name: Todd Estrada ?MRN: [id number] ?Date of Birth: January 13, 1961 ? ?Beginning of progress report period: March 23, 2022 ?End of progress report period: April 01, 2022 ? ?Today's Date: 04/01/2022 ?PT Individual Time: 7741-4239 ?  53 min  ? ?Patient has met 3 of 5 short term goals.  Pt is making slow progress towards LTG of CGA-min assist overall due to poor cognition and participation of therapy. Pt has progressed to min assist transfers and mdo A +2 for gait at times in parallel bars up to 74f. No family has been present for education.  ? ?Patient continues to demonstrate the following deficits muscle weakness, muscle joint tightness, and muscle paralysis, decreased cardiorespiratoy endurance and decreased oxygen support, impaired timing and sequencing, abnormal tone, unbalanced muscle activation, decreased coordination, and decreased motor planning, decreased visual acuity, decreased visual perceptual skills, decreased visual motor skills, and field cut, decreased attention to right, decreased initiation, decreased attention, decreased awareness, decreased problem solving, decreased safety awareness, decreased memory, and delayed processing, and decreased sitting balance, decreased standing balance, decreased postural control, hemiplegia, and decreased balance strategies and therefore will continue to benefit from skilled PT intervention to increase functional independence with mobility. ? ?Patient progressing toward long term goals..  Continue plan of care. ? ?PT Short Term Goals ?Week 1:  PT Short Term Goal 1 (Week 1): [facility] ?PT Short Term Goal 1 - Progress (Week 1): Met ?PT Short Term Goal 2 (Week 1): Pt will consistently perform bed mobility with min assist ?PT Short Term Goal 2 - Progress (Week 1): Met ?PT Short Term Goal 3 (Week 1): Pt will ambulate 37fwith mod A +2 ?PT Short Term Goal 3 - Progress (Week 1): Progressing  toward goal ?Week 2:  PT Short Term Goal 1 (Week 2): Pt will ambulate 3014fith mod assist ?PT Short Term Goal 2 (Week 2): Pt will perform TUG ?PT Short Term Goal 3 (Week 2): Pt will tolerate standing up to 2 minutes with min assist to improve ability to participate in ADLs ? ?Skilled Therapeutic Interventions/Progress Updates:  ? ?Pt received sitting in WC and agreeable to PT ? ?Sit<>stand transfer training. Standing tolerance. Pregait training to perform step to target 2 x 5 BLE with mod assist for advancement of the RLE. Gait training x 2 bouts with max assist on first and min assist +2 on second in parallel bars with cues for attention to the RLE to increase step length and prevent knee buckling on the RLE. Pt returned to room and performed stand pivot transfer to bed with min assist from PT. Sit>supine completed with min assist on the R side UE and LE, and left supine in bed with call bell in reach and all needs met. Prolonged therapeutic rest breaks required throughout session due to fatigue.  ?  ?   ? ?Therapy Documentation ?Precautions:  ?Precautions ?Precautions: Fall ?Precaution Comments: posey belt, Bil mitts, R inattention, trach ?Restrictions ?Weight Bearing Restrictions: No ? ?Vital Signs: ?Therapy Vitals ?Temp: 97.8 ?F (36.6 ?C) ?Temp Source: Oral ?Pulse Rate: 93 ?Resp: 17 ?BP: 109/73 ?Patient Position (if appropriate): Lying ?Oxygen Therapy ?SpO2: (!) 67 % ?O2 Device: Room Air ?O2 Flow Rate (L/min): 5 L/min ?FiO2 (%): 28 % ?Pain: ? Faces: none ? ? ?Therapy/Group: Individual Therapy ? ?AusLorie Phenix/14/2023, 2:12 PM  ?

## 2022-04-01 NOTE — Progress Notes (Signed)
Speech Language Pathology Daily Session Note ? ?Patient Details  ?Name: Todd Estrada ?MRN: [id number] ?Date of Birth: March 19, 1961 ? ?Today's Date: 04/01/2022 ?SLP Missed Time: 45 Minutes ?Missed Time Reason: Patient unwilling to participate;Patient fatigue ? ?Skilled Therapeutic Interventions: Patient sleeping in bed on arrival with [facility] elevated at 30 degrees. Pt required mod A verbal stimuli to rouse. Pt with difficulty keeping eyes open and shaking head no to all attempts to initiate therapy despite considerable verbal encouragement. Patient missed 45 minutes of skilled ST intervention due to fatigue/unwilling to participate. Will attempt to see patient again as schedule allows. ?   ?Patty Sermons ?04/01/2022, 5:10 PM ?

## 2022-04-02 DIAGNOSIS — N390 Urinary tract infection, site not specified: Secondary | ICD-10-CM

## 2022-04-02 DIAGNOSIS — I69391 Dysphagia following cerebral infarction: Secondary | ICD-10-CM

## 2022-04-02 DIAGNOSIS — E785 Hyperlipidemia, unspecified: Secondary | ICD-10-CM

## 2022-04-02 LAB — CBC WITH DIFFERENTIAL/PLATELET
Abs Immature Granulocytes: 0.03 10*3/uL (ref 0.00–0.07)
Basophils Absolute: 0.1 10*3/uL (ref 0.0–0.1)
Basophils Relative: 1 %
Eosinophils Absolute: 0.3 10*3/uL (ref 0.0–0.5)
Eosinophils Relative: 4 %
HCT: 39.3 % (ref 39.0–52.0)
Hemoglobin: 13.3 g/dL (ref 13.0–17.0)
Immature Granulocytes: 0 %
Lymphocytes Relative: 25 %
Lymphs Abs: 2.1 10*3/uL (ref 0.7–4.0)
MCH: 30.5 pg (ref 26.0–34.0)
MCHC: 33.8 g/dL (ref 30.0–36.0)
MCV: 90.1 fL (ref 80.0–100.0)
Monocytes Absolute: 0.6 10*3/uL (ref 0.1–1.0)
Monocytes Relative: 7 %
Neutro Abs: 5.3 10*3/uL (ref 1.7–7.7)
Neutrophils Relative %: 63 %
Platelets: 241 10*3/uL (ref 150–400)
RBC: 4.36 MIL/uL (ref 4.22–5.81)
RDW: 13 % (ref 11.5–15.5)
WBC: 8.4 10*3/uL (ref 4.0–10.5)
nRBC: 0 % (ref 0.0–0.2)

## 2022-04-02 NOTE — Progress Notes (Signed)
Occupational Therapy Session Note ? ?Patient Details  ?Name: VALGENE DELOATCH ?MRN: [id number] ?Date of Birth: 1961-02-09 ? ?Today's Date: 04/02/2022 ?OT Co-Treatment Time: 2979-8921 ?OT Co-Treatment Time Calculation (min): 12 min ? ? ?Short Term Goals: ?Week 1:  OT Short Term Goal 1 (Week 1): Pt will complete 2 grooming tasks at sink with mod A. ?OT Short Term Goal 1 - Progress (Week 1): Not met ?OT Short Term Goal 2 (Week 1): Pt will complete sit to stand at sink with mod A. ?OT Short Term Goal 2 - Progress (Week 1): Not met ?OT Short Term Goal 3 (Week 1): Pt will don shirt with max A. ?OT Short Term Goal 3 - Progress (Week 1): Not met ?OT Short Term Goal 4 (Week 1): Pt will transfer to toilet with max A and LRAD. ?OT Short Term Goal 4 - Progress (Week 1): Not met ?Week 2:  OT Short Term Goal 1 (Week 2): Pt will complete 2 grooming tasks at sink with mod A. ?OT Short Term Goal 2 (Week 2): Pt will complete sit to stand at sink with mod A. ?OT Short Term Goal 3 (Week 2): Pt will don shirt with max A. ?OT Short Term Goal 4 (Week 2): Pt will transfer to toilet with max A and LRAD. ?OT Short Term Goal 5 (Week 2): Pt will participate in 30 min of OT session in 2/3 attemps. ? ?Skilled Therapeutic Interventions/Progress Updates:  ?   ?Cotx ([id number]): Pt received semi-reclined in bed, no s/sx pain, agreeable to therapy. Session focus on self-care retraining, activity tolerance, transfer retraining in prep for improved ADL/IADL/func mobility performance + decreased caregiver burden. Pt seen for cotx with PT this session. ? ?Came to sitting EOB with min A to progress RLE off bed. Total A to don B teds/socks, but pt able to raise BLE to assist. Does require max multimodal cuing to attend to RLE vs LLE when given RLE commands. ? ?Doffed shirt min A to pull over head, donned new shirt min A to thread RLE. Donned pants with min A to thread RLE. Pt applied deodorant with min A for initiation due to initially rolling it on his chest.  Combed hair mod A for thoroughness.  ? ?Short ambulatory transfer > TIS with min to mod A of 2, pt abandoning his RW despite R hand in saddle splint, and attempts to sit prior to fully turning.  ? ?Stand-pivot to and from toilet mod A of 2 due to difficulty stepping RLE backwards and attempting to sit. Total A for toileting tasks post continent/incontinent void of BM, +2 present to assist with standing at min to mod with fatigue. Required several seated rest breaks during pericare due to fatigue.  ? ?Short ambulatory transfer mod A of 2 and no AD, BUE hand-held assist, again with significant difficulty stepping RLE backwards requiring TIS to be pulled up. ? ? ?SatO2 did drop to 87% on RA via trach collar, did recover within 5 seconds after seated rest break. Replaced on 5L via trach collar and pt satting at 93%. ? ? ?Pt left tilted back in TIS with B safety mitts on, PSMV doffed, safety belt alarm engaged, call bell in reach, and all immediate needs met.  ? ?Therapy Documentation ?Precautions:  ?Precautions ?Precautions: Fall ?Precaution Comments: posey belt, Bil mitts, R inattention, trach ?Restrictions ?Weight Bearing Restrictions: No ? ?Pain: no s/sx throughout ?  ?ADL: See Care Tool for more details. ? ?Therapy/Group: Individual Therapy ? ?Volanda Napoleon MS,  OTR/L ? ?04/02/2022, 7:00 AM ?

## 2022-04-02 NOTE — Progress Notes (Signed)
Speech Language Pathology Daily Session Note ? ?Patient Details  ?Name: Todd Estrada ?MRN: [id number] ?Date of Birth: [birth year] ? ?Today's Date: 04/02/2022 ?SLP Individual Time: 9432-7614 ?SLP Individual Time Calculation (min): 45 min ? ?Short Term Goals: ?Week 2: SLP Short Term Goal 1 (Week 2): Patient will respond to basic yes/no questions in regards to biographical and environmental information with mod A verbal/visual cues to achieve 75% accuracy ?SLP Short Term Goal 2 (Week 2): Patient will consistently tolerate PMSV as evidenced by SpO2>92% for 30 minute duration ?SLP Short Term Goal 3 (Week 2): Patient will consume current diet with minimal overt s/sx of aspiration and min A cues for implementation of safe swallowing precautions and strategies ?SLP Short Term Goal 4 (Week 2): Patient will be oriented to person/place/time/situation with min A cues for use of external aids ?SLP Short Term Goal 5 (Week 2): Pt will communicate basic wants/needs via multimodal communication and mod A cues ?SLP Short Term Goal 6 (Week 2): Patient will identify at the word level given field of 3 written choices with mod A to achieve 75% accuracy ? ?Skilled Therapeutic Interventions: ?Pt seen for skilled ST with focus on swallowing and communication goals, pt in bed asleep but rouses to voice and agreeable to AM meal. SLP providing total A for donning teal PMSV and feeding Dys 1/NTL meal. Pt consuming 90% of AM meal (sausage, pancake, applesauce and 1/2 cream of wheat, 1/2 OJ and 1/2 coffee) with occasional cough (4 total across meal). O2 states remained >94%. Pt appeared to fatigue toward end of meal resulting in extended A-P transit and apparent min delayed swallow trigger. SLP facilitating communication of basic wants/needs throughout the session with min A via yes/no questions, pt was able to verbalize responses accurately with no need for communication board at bedside. Pt PMSV removed and HOB lowered slightly for pt comfort. Pt  left in bed with alarm set and soft touch call button on chest. Cont ST POC. ? ?Pain ?Pain Assessment ?Pain Scale: 0-10 ?Pain Score: 0-No pain ? ?Therapy/Group: Individual Therapy ? ?Dewaine Conger ?04/02/2022, 8:04 AM ?

## 2022-04-02 NOTE — Progress Notes (Signed)
?                                                       PROGRESS NOTE ? ? ?Subjective/Complaints: ?Patient seen sitting up in bed this morning.  Indicates he slept well overnight.  No reported issues overnight. ? ?ROS: cannot obtain due to aphasia/dysarthria ? ?Objective: ?  ?No results found. ?Recent Labs  ?  04/01/22 ?7096 04/02/22 ?2836  ?WBC 10.1 8.4  ?HGB 13.2 13.3  ?HCT 39.3 39.3  ?PLT 254 241  ? ? ?No results for input(s): NA, K, CL, CO2, GLUCOSE, BUN, CREATININE, CALCIUM in the last 72 hours. ? ? ?Intake/Output Summary (Last 24 hours) at 04/02/2022 1054 ?Last data filed at 04/01/2022 1743 ?Gross per 24 hour  ?Intake 237 ml  ?Output --  ?Net 237 ml  ? ?  ? ?Pressure Injury 03/22/22 Heel Right;Lateral Deep Tissue Pressure Injury - Purple or maroon localized area of discolored intact skin or blood-filled blister due to damage of underlying soft tissue from pressure and/or shear. (Active)  ?03/22/22 1500  ?Location: Heel  ?Location Orientation: Right;Lateral  ?Staging: Deep Tissue Pressure Injury - Purple or maroon localized area of discolored intact skin or blood-filled blister due to damage of underlying soft tissue from pressure and/or shear.  ?Wound Description (Comments):   ?Present on Admission: Yes  ? ? ?Physical Exam: ?Vital Signs ?Blood pressure 125/69, pulse 73, temperature 98.7 ?F (37.1 ?C), resp. rate 18, weight 77.9 kg, SpO2 97 %. ? ?General: NAD. ?Mood and affect are appropriate ?Neck: +Trach ?Heart: Regular rate and rhythm no rubs murmurs or extra sounds ?Lungs: breathing unlabored, wet sounding. ?Abdomen: Positive bowel sounds, soft nontender to palpation, nondistended ?Extremities: No clubbing, cyanosis, or edema ?Skin: ?+PEG site LUQ, no drainage or tenderness  ?Skin: No evidence of breakdown, no evidence of rash ?Neurologic: Alert ?is 4/5 in Left , 2+ /5 RIght deltoid, bicep, tricep, grip  ?Musculoskeletal: Full range of motion in all 4 extremities. No joint swelling ? ?Assessment/Plan: ?1.  Functional deficits which require 3+ hours per day of interdisciplinary therapy in a comprehensive inpatient rehab setting. ?Physiatrist is providing close team supervision and 24 hour management of active medical problems listed below. ?Physiatrist and rehab team continue to assess barriers to discharge/monitor patient progress toward functional and medical goals ? ?Care Tool: ? ?Bathing ?   ?Body parts bathed by patient: Right arm, Left arm, Chest, Abdomen  ?   ?  ?  ?Bathing assist Assist Level: Total Assistance - Patient < 25% ?  ?  ?Upper Body Dressing/Undressing ?Upper body dressing   ?What is the patient wearing?: Pull over shirt ?   ?Upper body assist Assist Level: Minimal Assistance - Patient > 75% ?   ?Lower Body Dressing/Undressing ?Lower body dressing ? ? ?   ?What is the patient wearing?: Pants ? ?  ? ?Lower body assist Assist for lower body dressing: Maximal Assistance - Patient 25 - 49% ?   ? ?Toileting ?Toileting    ?Toileting assist Assist for toileting: 2 Helpers ?  ?  ?Transfers ?Chair/bed transfer ? ?Transfers assist ?   ? ?Chair/bed transfer assist level: Minimal Assistance - Patient > 75% ?  ?  ?Locomotion ?Ambulation ? ? ?Ambulation assist ? ?   ? ?Assist level: 2 helpers ?Assistive device: Other (comment) ?Max distance: 10  ? ?  Walk 10 feet activity ? ? ?Assist ?   ? ?Assist level: 2 helpers ?Assistive device: Other (comment)  ? ?Walk 50 feet activity ? ? ?Assist Walk 50 feet with 2 turns activity did not occur: Safety/medical concerns ? ?  ?   ? ? ?Walk 150 feet activity ? ? ?Assist Walk 150 feet activity did not occur: Safety/medical concerns ? ?  ?  ?  ? ?Walk 10 feet on uneven surface  ?activity ? ? ?Assist Walk 10 feet on uneven surfaces activity did not occur: Safety/medical concerns ? ? ?  ?   ? ?Wheelchair ? ? ? ? ?Assist Is the patient using a wheelchair?: Yes ?Type of Wheelchair: Manual (per PT note, TIS w/c) ?  ? ?Wheelchair assist level: Dependent - Patient 0% ?Max wheelchair  distance: 150  ? ? ?Wheelchair 50 feet with 2 turns activity ? ? ? ?Assist ? ?  ?  ? ? ?Assist Level: Dependent - Patient 0%  ? ?Wheelchair 150 feet activity  ? ? ? ?Assist ?   ? ? ?Assist Level: Dependent - Patient 0%  ? ?Blood pressure 125/69, pulse 73, temperature 98.7 ?F (37.1 ?C), resp. rate 18, weight 77.9 kg, SpO2 97 %. ? ?Medical Problem List and Plan: ?1. Functional deficits secondary to left MCA stroke with right-sided weakness, slurred speech and expressive aphasia on admission ? Continue CIR ?2.  Antithrombotics: ?-DVT/anticoagulation:  Pharmaceutical: Lovenox stopped due to hematuria, use SCDs  ?            -antiplatelet therapy: Plavix and aspirin ?3. Pain Management: Tylenol prn ?4. Mood: LCSW to evaluate and provide emotional support ?            -antipsychotic agents: Seroquel 25 mg q HS ?            --Ativan prn agitation; continue restraints for now ?5. Neuropsych: This patient is not capable of making decisions on his own behalf. ?6. Skin/Wound Care: Routine skin care checks ?            --known pressure injury versus bruise>>? Left buttock ?            --dry, ischemic ulcers bilateral toes ?7. Fluids/Electrolytes/Nutrition: Routine Is and Os ?            --N.p.o. for post stroke dysphagia, advance diet as tolerated ?            --on continuous TFs at 55 cc/hr>>ask dietary to assist with conversion to TF bouses ?            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            --on Plavix and aspirin ?9: CHF: "eventually resume metoprolol 12.5 mg BID", does not appear to be in fluid overload clinically but in DDx , receiving ~1171ml of fluid per day  ?10: Hypertension: continue TID monitoring ?Vitals:  ? 04/02/22 0719 04/02/22 0723  ?BP:    ?Pulse:  73  ?Resp:  18  ?Temp:    ?SpO2: 97% 97%  ?  ?11: Hyperlipidemia: continue Lipitor 40 mg daily ?12: Seizure-type activity: continue Keppra 500 mg BID ?13: Alcohol abuse: provide cessation counseling- ETOH  neg on admission  ?            --continue thiamine supplementation ?14: Trach/Aspiration pneumonia and multiple intubation/extubation for airway protection ?            --  s/p tracheostomy 3/22> trach capping per CCM ?            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            -stable, CCM following  ?17: Tobacco abuse: continue nicotine patch ?            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 PET CT examination is recommended ?for further evaluation. Associated left hilar pathologic adenopathy. ?20: Transaminitis:  ? ALT mildly elevated on 4/5 ?21. Left MCA stroke 08/2021 with petechial hemorrhage on aspirin monotherapy ?22. Loose stools- due to Colace? Suggest bulking up stools at this point.  ?23. Leukocytosis post PNA- cont to monitor - remains afebrile but at risk for UTI or PNA  ? 4/9: Keflex started on 4/8 for >100,000 E coli in UC, is sens to Keflex completed on 4/15 ? ?  Latest Ref Rng & Units 04/02/2022  ?  5:08 AM 04/01/2022  ?  5:33 AM 03/28/2022  ?  7:53 AM  ?CBC  ?WBC 4.0 - 10.5 K/uL 8.4   10.1   10.0    ?Hemoglobin 13.0 - 17.0 g/dL 13.3   13.2   14.6    ?Hematocrit 39.0 - 52.0 % 39.3   39.3   43.5    ?Platelets 150 - 400 K/uL 241   254   269    ?  ?24.  PAD RIght SFA occlusion VVS eval appreciated, ABI reviewed and shows moderate to severe arterial disease no clincal signs of critical limb ischemia,  cannot express other symptoms such as claudication due to aphasia following CVA, needs OP f/u with VVS  ?25. Lethargy: multifactorial, Likely due to hypoxia and UTI  , now improved ?26.  Hematuria- has stopped, tubing has clear urine, pt likely had  external trauma related to freq removal and reapplication of condom cath while receiving lovenox , plavix and ASA, will d/c lovenox , monitor CBC daily x 5, no therapy restriction but will need restraints to lim

## 2022-04-02 NOTE — Progress Notes (Signed)
Physical Therapy Session Note ? ?Patient Details  ?Name: Todd Estrada ?MRN: [id number] ?Date of Birth: 1961-04-05 ? ?Today's Date: 04/02/2022 ?PT Individual Time: 9802-2179 ?PT Individual Time Calculation (min): 53 min  ? ?Short Term Goals: ? ?Week 2:  PT Short Term Goal 1 (Week 2): Pt will ambulate 74f with mod assist ?PT Short Term Goal 2 (Week 2): Pt will perform TUG ?PT Short Term Goal 3 (Week 2): Pt will tolerate standing up to 2 minutes with min assist to improve ability to participate in ADLs ? ? ?Skilled Therapeutic Interventions/Progress Updates:  ? ?Pt received sitting in WC and agreeable to PT. Pt transportedto rehab gym in [name]<>stand [place] assist and cues [facility]. Gait training with RW and R hand splint x 129fwith min assist and +2 for WC follow. Pt noted to have increased facial palor and laborer breathing.  ? ?PT performed orthostatic assessment. ?Seated. 100/77  ?Standing 88/69 ?seated97/73.  ?Following standing balance.  ?Standing 97/73. ?Seated: 109/87 ?Horse shoe toss to target 2 x 8 with CGA for balance and moderate cues for participation and toss motor plan.   ? ?Sit<>stand transfers with min assist and cues for UE placement throughout VS assessment and balance training.  ? ?Pt declining additional standing or gait due to fatigue. Throughout session SpO2 100% on room air.  ? ?Pt returned to room and performed stand pivot transfer to bed with CGA and UE supported on bed rail. Sit>supine completed with supervision assist, and left supine in bed with call bell in reach and all needs met.  ? ?   ? ? ?Therapy Documentation ?Precautions:  ?Precautions ?Precautions: Fall ?Precaution Comments: posey belt, Bil mitts, R inattention, trach ?Restrictions ?Weight Bearing Restrictions: No ? ?Vital Signs: ?Therapy Vitals ?Pulse Rate: 82 ?Resp: 18 ?Patient Position (if appropriate): Lying ?Oxygen Therapy ?SpO2: 94 % ?O2 Device: Tracheostomy Collar ?O2 Flow Rate (L/min): 5  L/min ?FiO2 (%): 21 % ?Pain: ?Faces; none.  ? ? ?Therapy/Group: Individual Therapy ? ?AuLorie Phenix4/15/2023, 5:54 PM  ?

## 2022-04-03 LAB — CBC WITH DIFFERENTIAL/PLATELET
Abs Immature Granulocytes: 0.03 10*3/uL (ref 0.00–0.07)
Basophils Absolute: 0.1 10*3/uL (ref 0.0–0.1)
Basophils Relative: 1 %
Eosinophils Absolute: 0.4 10*3/uL (ref 0.0–0.5)
Eosinophils Relative: 4 %
HCT: 39.5 % (ref 39.0–52.0)
Hemoglobin: 13.1 g/dL (ref 13.0–17.0)
Immature Granulocytes: 0 %
Lymphocytes Relative: 21 %
Lymphs Abs: 2.1 10*3/uL (ref 0.7–4.0)
MCH: 29.9 pg (ref 26.0–34.0)
MCHC: 33.2 g/dL (ref 30.0–36.0)
MCV: 90.2 fL (ref 80.0–100.0)
Monocytes Absolute: 0.6 10*3/uL (ref 0.1–1.0)
Monocytes Relative: 6 %
Neutro Abs: 6.7 10*3/uL (ref 1.7–7.7)
Neutrophils Relative %: 68 %
Platelets: 261 10*3/uL (ref 150–400)
RBC: 4.38 MIL/uL (ref 4.22–5.81)
RDW: 13.1 % (ref 11.5–15.5)
WBC: 9.9 10*3/uL (ref 4.0–10.5)
nRBC: 0 % (ref 0.0–0.2)

## 2022-04-03 NOTE — Progress Notes (Signed)
Messaged CCM re: pt  due for routine trach change.  Per Dr Tacy Learn, CCM will assess for trach change tomorrow.   ?

## 2022-04-03 NOTE — Progress Notes (Signed)
Occupational Therapy Session Note ? ?Patient Details  ?Name: Todd Estrada ?MRN: [id number] ?Date of Birth: 1961/09/25 ? ?Today's Date: 04/03/2022 ?OT Individual Time: 4835-0757 ?OT Individual Time Calculation (min): 77 min  ? ? ?Short Term Goals: ?Week 1:  OT Short Term Goal 1 (Week 1): Pt will complete 2 grooming tasks at sink with mod A. ?OT Short Term Goal 1 - Progress (Week 1): Not met ?OT Short Term Goal 2 (Week 1): Pt will complete sit to stand at sink with mod A. ?OT Short Term Goal 2 - Progress (Week 1): Not met ?OT Short Term Goal 3 (Week 1): Pt will don shirt with max A. ?OT Short Term Goal 3 - Progress (Week 1): Not met ?OT Short Term Goal 4 (Week 1): Pt will transfer to toilet with max A and LRAD. ?OT Short Term Goal 4 - Progress (Week 1): Not met ?Week 2:  OT Short Term Goal 1 (Week 2): Pt will complete 2 grooming tasks at sink with mod A. ?OT Short Term Goal 2 (Week 2): Pt will complete sit to stand at sink with mod A. ?OT Short Term Goal 3 (Week 2): Pt will don shirt with max A. ?OT Short Term Goal 4 (Week 2): Pt will transfer to toilet with max A and LRAD. ?OT Short Term Goal 5 (Week 2): Pt will participate in 30 min of OT session in 2/3 attemps. ? ?Skilled Therapeutic Interventions/Progress Updates:  ? Patient lying in supine upon arrival with O2 saturation of 94% , pt indicated that he had no pain response during treatment. The pt complete UB/LB BADL in bathing and dressing at bed LOF.  The pt was able to transition from supine to side lying to the right and left, with MinA , incorporating the bed rail with the head of the bed at 30 degrees secondary to trachea placement.  The pt was able to wash his UB inclusive of his face, chest, arms, and stomach area with moderate vc' s and s/u assist.  The pt required MaxA for LB bathing. The pt was MinA for donning a hospital gown and MaxA for donning his brief.The pt was able to complete oral care with s/u assist using a swab.  Respiratory therapy came in to  complete treatment and  the pt presented with increase confusion in relation to simple commands and required more physical assistance and increased prompting.  The pt remained at bed LOF with the call light in place and alarm activated,  and mitting in place  All additional needs were addressed at the time of treatment.  ?Therapy Documentation ?Precautions:  ?Precautions ?Precautions: Fall ?Precaution Comments: posey belt, Bil mitts, R inattention, trach ?Restrictions ?Weight Bearing Restrictions: No ?General: ?  ?Therapy/Group: Individual Therapy ? ?Yvonne Kendall ?04/03/2022, 9:41 AM ?

## 2022-04-03 NOTE — Progress Notes (Signed)
?                                                       PROGRESS NOTE ? ? ?Subjective/Complaints: ?Patient seen sitting up in bed this morning.  He states he slept well overnight.  He denies complaints. ? ?ROS: cannot obtain due to aphasia/dysarthria, but appears to deny CP, shortness of breath, nausea, vomiting, diarrhea. ? ?Objective: ?  ?No results found. ?Recent Labs  ?  04/02/22 ?3614 04/03/22 ?0502  ?WBC 8.4 9.9  ?HGB 13.3 13.1  ?HCT 39.3 39.5  ?PLT 241 261  ? ? ?No results for input(s): NA, K, CL, CO2, GLUCOSE, BUN, CREATININE, CALCIUM in the last 72 hours. ? ? ?Intake/Output Summary (Last 24 hours) at 04/03/2022 0915 ?Last data filed at 04/03/2022 0747 ?Gross per 24 hour  ?Intake 1167 ml  ?Output --  ?Net 1167 ml  ? ?  ? ?Pressure Injury 03/22/22 Heel Right;Lateral Deep Tissue Pressure Injury - Purple or maroon localized area of discolored intact skin or blood-filled blister due to damage of underlying soft tissue from pressure and/or shear. (Active)  ?03/22/22 1500  ?Location: Heel  ?Location Orientation: Right;Lateral  ?Staging: Deep Tissue Pressure Injury - Purple or maroon localized area of discolored intact skin or blood-filled blister due to damage of underlying soft tissue from pressure and/or shear.  ?Wound Description (Comments):   ?Present on Admission: Yes  ? ? ?Physical Exam: ?Vital Signs ?Blood pressure 95/60, pulse 71, temperature 98.6 ?F (37 ?C), temperature source Oral, resp. rate 16, weight 77.9 kg, SpO2 95 %. ? ?General: NAD. ?Mood and affect are appropriate ?Neck: +Trach ?Heart: Regular rate and rhythm no rubs murmurs or extra sounds ?Lungs: breathing unlabored, wet sounding. ?Abdomen: Positive bowel sounds, soft nontender to palpation, nondistended ?Extremities: No clubbing, cyanosis, or edema ?Skin: ?+ PEG site LUQ, no drainage or tenderness  ?Skin: No evidence of breakdown, no evidence of rash ?Neurologic: Alert ?is 4/5 in Left ,  3/5 RUE/RLE. ?Musculoskeletal: Full range of motion in  all 4 extremities. No joint swelling ? ?Assessment/Plan: ?1. Functional deficits which require 3+ hours per day of interdisciplinary therapy in a comprehensive inpatient rehab setting. ?Physiatrist is providing close team supervision and 24 hour management of active medical problems listed below. ?Physiatrist and rehab team continue to assess barriers to discharge/monitor patient progress toward functional and medical goals ? ?Care Tool: ? ?Bathing ?   ?Body parts bathed by patient: Face  ?   ?  ?  ?Bathing assist Assist Level: Supervision/Verbal cueing ?  ?  ?Upper Body Dressing/Undressing ?Upper body dressing   ?What is the patient wearing?: Pull over shirt ?   ?Upper body assist Assist Level: Minimal Assistance - Patient > 75% ?   ?Lower Body Dressing/Undressing ?Lower body dressing ? ? ?   ?What is the patient wearing?: Pants ? ?  ? ?Lower body assist Assist for lower body dressing: Minimal Assistance - Patient > 75% ?   ? ?Toileting ?Toileting    ?Toileting assist Assist for toileting: 2 Helpers ?  ?  ?Transfers ?Chair/bed transfer ? ?Transfers assist ?   ? ?Chair/bed transfer assist level: Minimal Assistance - Patient > 75% ?  ?  ?Locomotion ?Ambulation ? ? ?Ambulation assist ? ?   ? ?Assist level: 2 helpers ?Assistive device: Other (comment) ?Max distance: 10  ? ?  Walk 10 feet activity ? ? ?Assist ?   ? ?Assist level: 2 helpers ?Assistive device: Other (comment)  ? ?Walk 50 feet activity ? ? ?Assist Walk 50 feet with 2 turns activity did not occur: Safety/medical concerns ? ?  ?   ? ? ?Walk 150 feet activity ? ? ?Assist Walk 150 feet activity did not occur: Safety/medical concerns ? ?  ?  ?  ? ?Walk 10 feet on uneven surface  ?activity ? ? ?Assist Walk 10 feet on uneven surfaces activity did not occur: Safety/medical concerns ? ? ?  ?   ? ?Wheelchair ? ? ? ? ?Assist Is the patient using a wheelchair?: Yes ?Type of Wheelchair: Manual (per PT note, TIS w/c) ?  ? ?Wheelchair assist level: Dependent - Patient  0% ?Max wheelchair distance: 150  ? ? ?Wheelchair 50 feet with 2 turns activity ? ? ? ?Assist ? ?  ?  ? ? ?Assist Level: Dependent - Patient 0%  ? ?Wheelchair 150 feet activity  ? ? ? ?Assist ?   ? ? ?Assist Level: Dependent - Patient 0%  ? ?Blood pressure 95/60, pulse 71, temperature 98.6 ?F (37 ?C), temperature source Oral, resp. rate 16, weight 77.9 kg, SpO2 95 %. ? ?Medical Problem List and Plan: ?1. Functional deficits secondary to left MCA stroke with right-sided weakness, slurred speech and expressive aphasia on admission ? Continue CIR ?2.  Antithrombotics: ?-DVT/anticoagulation:  Pharmaceutical: Lovenox stopped due to hematuria, use SCDs  ?            -antiplatelet therapy: Plavix and aspirin ?3. Pain Management: Tylenol prn ?4. Mood: LCSW to evaluate and provide emotional support ?            -antipsychotic agents: Seroquel 25 mg q HS ?            --Ativan prn agitation; continue restraints for now ?5. Neuropsych: This patient is not capable of making decisions on his own behalf. ?6. Skin/Wound Care: Routine skin care checks ?            --known pressure injury versus bruise>>? Left buttock ?            --dry, ischemic ulcers bilateral toes ?7. Fluids/Electrolytes/Nutrition: Routine Is and Os ?            --NPO for post stroke dysphagia, advance diet as tolerated ?            --on continuous TFs at 55 cc/hr>>ask dietary to assist with conversion to TF bouses ?            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            --on Plavix and aspirin ?9: CHF: "eventually resume metoprolol 12.5 mg BID", does not appear to be in fluid overload clinically but in DDx ?10: Hypertension: continue TID monitoring ?Vitals:  ? 04/03/22 0547 04/03/22 0849  ?BP: 95/60   ?Pulse: 71   ?Resp: 16   ?Temp: 98.6 ?F (37 ?C)   ?SpO2: 93% 95%  ? Relatively soft on 4/16 ?11: Hyperlipidemia: continue Lipitor  40 mg daily ?12: Seizure-type activity: continue Keppra 500 mg BID ?13: Alcohol  abuse: provide cessation counseling- ETOH neg on admission  ?            --continue thiamine supplementation ?14: Trach/Aspiration pneumonia and multiple intubation/extubation for airway protection ?            --  s/p tracheostomy 3/22> trach capping per CCM ?            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            -stable, CCM following  ?17: Tobacco abuse: continue nicotine patch ?            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 PET CT examination is recommended ?for further evaluation. Associated left hilar pathologic adenopathy. ?20: Transaminitis:  ? ALT mildly elevated on 4/5 ?21. Left MCA stroke 08/2021 with petechial hemorrhage on aspirin monotherapy ?22. Loose stools- due to Colace? Suggest bulking up stools at this point.  ?23. Leukocytosis post PNA- cont to monitor - remains afebrile but at risk for UTI or PNA  ? 4/9: Keflex started on 4/8 for >100,000 E coli in UC, is sens to Keflex completed on 4/15 ? ?  Latest Ref Rng & Units 04/03/2022  ?  5:02 AM 04/02/2022  ?  5:08 AM 04/01/2022  ?  5:33 AM  ?CBC  ?WBC 4.0 - 10.5 K/uL 9.9   8.4   10.1    ?Hemoglobin 13.0 - 17.0 g/dL 13.1   13.3   13.2    ?Hematocrit 39.0 - 52.0 % 39.5   39.3   39.3    ?Platelets 150 - 400 K/uL 261   241   254    ?  ?24. PAD RIght SFA occlusion VVS eval appreciated, ABI reviewed and shows moderate to severe arterial disease no clincal signs of critical limb ischemia,  cannot express other symptoms such as claudication due to aphasia following CVA, needs OP f/u with VVS  ?25. Lethargy: multifactorial, Likely due to hypoxia and UTI  , now improved ?26. Hematuria- has stopped,  tubing has clear urine, pt likely had external trauma related to freq removal and reapplication of condom cath while receiving lovenox , plavix and ASA, will d/c lovenox ,  ? CBC WNL on 4/16 ? ?LOS: ?12 day

## 2022-04-03 NOTE — Progress Notes (Signed)
Physical Therapy Session Note ? ?Patient Details  ?Name: Todd Estrada ?MRN: [id number] ?Date of Birth: [birth year] ? ?Today's Date: 04/03/2022 ?PT Individual Time: 9833-8250 ?PT Individual Time Calculation (min): 30 min  ? ?Short Term Goals: ?Week 2:  PT Short Term Goal 1 (Week 2): Pt will ambulate 6ft with mod assist ?PT Short Term Goal 2 (Week 2): Pt will perform TUG ?PT Short Term Goal 3 (Week 2): Pt will tolerate standing up to 2 minutes with min assist to improve ability to participate in ADLs ? ?Skilled Therapeutic Interventions/Progress Updates:  ?  Attempted to see patient at scheduled time of 1345, pt receiving care from nursing for brief change following incontinence. Pt received at 1355 and agreeable to PT session. No indications of pain during session. Pt remains mostly nonverbal throughout session with utilizing head movements for yes/no. Pt declines to don speaking valve during session. Supine BP 116/64. Supine to sit with min A with use of bedrails and increased time needed to complete. Pt indicates some dizziness in sitting. Seated BP 97/64 while wearing abdominal binder. Assisted pt with donning knee-high TEDs while seated EOB. Sit to stand with mod HHA. Pt tolerates standing less than 30 sec before sitting back down. Pt indicates he had increase in dizziness in standing. Seated BP following standing activity is 98/65. Pt requests to lay back down due to fatigue. Sit to supine CGA. Assisted pt with doffing TED hose. Pt left seated in bed with needs in reach, bed alarm in place. Pt on 5L O2 at FiO2 28% via trach collar during session, SpO2 remains at 93% and higher. Pt missed 30 min total of scheduled therapy session due to nursing care at beginning of session and fatigue at end of session. ? ?Therapy Documentation ?Precautions:  ?Precautions ?Precautions: Fall ?Precaution Comments: posey belt, Bil mitts, R inattention, trach ?Restrictions ?Weight Bearing Restrictions: No ?General: ?PT Amount of Missed  Time (min): 30 Minutes ?PT Missed Treatment Reason: Nursing care;Patient fatigue ? ? ? ? ?Therapy/Group: Individual Therapy ? ?Excell Seltzer, PT, DPT, CSRS ?04/03/2022, 3:34 PM  ?

## 2022-04-04 LAB — BASIC METABOLIC PANEL
Anion gap: 8 (ref 5–15)
BUN: 23 mg/dL (ref 8–23)
CO2: 28 mmol/L (ref 22–32)
Calcium: 9.3 mg/dL (ref 8.9–10.3)
Chloride: 103 mmol/L (ref 98–111)
Creatinine, Ser: 0.81 mg/dL (ref 0.61–1.24)
GFR, Estimated: >60 mL/min (ref >60)
Glucose, Bld: 102 mg/dL — ABNORMAL HIGH (ref 70–99)
Potassium: 4.5 mmol/L (ref 3.5–5.1)
Sodium: 139 mmol/L (ref 135–145)

## 2022-04-04 LAB — CBC WITH DIFFERENTIAL/PLATELET
Abs Immature Granulocytes: 0.03 10*3/uL (ref 0.00–0.07)
Basophils Absolute: 0 10*3/uL (ref 0.0–0.1)
Basophils Relative: 0 %
Eosinophils Absolute: 0.4 10*3/uL (ref 0.0–0.5)
Eosinophils Relative: 5 %
HCT: 40 % (ref 39.0–52.0)
Hemoglobin: 13 g/dL (ref 13.0–17.0)
Immature Granulocytes: 0 %
Lymphocytes Relative: 26 %
Lymphs Abs: 2.1 10*3/uL (ref 0.7–4.0)
MCH: 29.7 pg (ref 26.0–34.0)
MCHC: 32.5 g/dL (ref 30.0–36.0)
MCV: 91.3 fL (ref 80.0–100.0)
Monocytes Absolute: 0.6 10*3/uL (ref 0.1–1.0)
Monocytes Relative: 7 %
Neutro Abs: 5 10*3/uL (ref 1.7–7.7)
Neutrophils Relative %: 62 %
Platelets: 252 10*3/uL (ref 150–400)
RBC: 4.38 MIL/uL (ref 4.22–5.81)
RDW: 13.2 % (ref 11.5–15.5)
WBC: 8.2 10*3/uL (ref 4.0–10.5)
nRBC: 0 % (ref 0.0–0.2)

## 2022-04-04 NOTE — Progress Notes (Signed)
? ?NAME:  Todd Estrada, MRN:  [id number], DOB:  May 23, 1961, LOS: 52 ?ADMISSION DATE:  03/22/2022, CONSULTATION DATE:  3/16 ?REFERRING MD:  Erlinda Hong, CHIEF COMPLAINT:  Confusion, seizure  ? ?History of Present Illness:  ?61 y/o male with a history of Left MCA stroke in 8099 with a complicated post stroke course presented on 3/15 with a 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  Intubated on 3/16 for airway protection.  Transferred to Rehab 03/22/2022. ? ?Pertinent  Medical History  ?Hyeprtension ?Left MCA stroke ?Chronic combined CHF ? ?Significant Hospital Events: ?Including procedures, antibiotic start and stop dates in addition to other pertinent events   ?3/15 admitted for acute L MCA stroke due to L ICA occlusion with first time seizure s/p revascularization of L ICA occlusion ?3/16 Intubated for airway protection; started unasyn; echo LVEF 40-45%, RVSP normal ?3/17 MRI Brain showed acute infarct in left frontal, temporal and parietal lobes with findings worrisome for acute infarct, left vert occluded; CT head did not show hemorrhage; respiratory notes thick secretions,  ?3/18 following some commands on exam, extubated the re-intubated due to hypoxemia, inability to clear secretions ?3/19 self extubated overnight, remains on NRB ?3/21 required reintubation for inability to protect airway and aspiration  ?3/22 bedside perc tracheostomy with bronchoscopy  ?3/24 PEG ?3/26 started on trach collar ?3/30 pulmonary critical care called to bedside for patient who has decannulate himself x2.  He is now restrained and most likely needs a sitter ?4/4 transferred to rehab ?4/5 pulmonary critical care consulted ?4/10 Pulmonary Critical care asked to assess for increased oxygen needs ?4/17 Awake, ATC 28%, #6 trach  ? ?Interim History / Subjective:  ?Up eating breakfast, no complaints.  Tried to drink water from the sterile water bottle on his bedside tray ?PMV in place ? ?Objective   ?Blood pressure 107/67,  pulse 77, temperature 98.2 ?F (36.8 ?C), resp. rate 16, weight 77.9 kg, SpO2 95 %. ?   ?FiO2 (%):  [21 %] 21 %  ? ?Intake/Output Summary (Last 24 hours) at 04/04/2022 0928 ?Last data filed at 04/04/2022 0745 ?Gross per 24 hour  ?Intake 1430 ml  ?Output --  ?Net 1430 ml  ? ?Filed Weights  ? 03/26/22 0501 03/27/22 0500 03/28/22 0512  ?Weight: 77 kg 78.2 kg 77.9 kg  ? ? ?General: disheveled adult male sitting up in bed in NAD  ?HEENT: MM pink/moist, anicteric, trach midline c/d/i ?Neuro: Awake/alert, speech clear ?CV: s1s2 RRR, no m/r/g ?PULM: non-labored at rest, lungs bilaterally clear ?GI: soft, bsx4 active, PEG  ?Extremities: warm/dry, no edema  ?Skin: no rashes or lesions ? ?Resolved Hospital Problem list   ?Septic shock due to aspiration pneumonia > resolved ?Constipation ? ?Assessment & Plan:  ? ?Acute encephalopathy due to left MCA stroke from left ICA occlusion requiring thrombectomy and stent placement on 3/15 ?Seizure disorder ?Agitated delirium on 3/19> risk to self pulling at tubes, lines ?  ? ?Acute hypoxemic respiratory failure due to aspiration pneumonia s/p tracheostomy   ?Thick pulmonary secretions ?Concern for emphysema/COPD > not in history but exam supports ?Intubated 3/18, trach 3/22.  New increase in O2 needs 4/10 in setting of mucus plugging.  At 28% on 4/17.   ? ?-trach care per protocol  ?-plan for routine trach change 4/17, #6 cuffless then go to monthly schedule ?-wean O2 for sats >90% via ATC, currently on 28% ?-NTS PRN  ?-continue brovana, pulmicort, yupelri ?-mental status improving, consider capping trials in next few weeks ?-PT /  OT efforts  ?-follow intermittent CXR ? ?Best Practice (right click and "Reselect all SmartList Selections" daily)  ?Per primary ?Diet/type: tubefeeds ?DVT prophylaxis: LMWH ?GI prophylaxis: PPI ?Lines: N/A ?Foley:  N/A ?Code Status:  full code ?Last date of multidisciplinary goals of care discussion: per primary  ? ?Critical care time: n/a  ?  ? ?Noe Gens,  MSN, APRN, NP-C, AGACNP-BC ?[facility] ?04/04/2022, 9:28 AM ? ? ?Please see [email or web address] for pager details.  ? ?From 7A-7P if no response, please call [phone] ?After hours, please call Warren Lacy [phone] ? ? ? ? ? ?

## 2022-04-04 NOTE — Progress Notes (Signed)
Physical Therapy Session Note ? ?Patient Details  ?Name: Todd Estrada ?MRN: [id number] ?Date of Birth: August 07, 1961 ? ?Today's Date: 04/02/2022 ?PT Co-Treatment Time:  3570-1779  ?PT Co-Treatment Time Calculation (min): 31 min ? ?Short Term Goals: ?Week 1:  PT Short Term Goal 1 (Week 1): pt will trasnfer to [facility] with min assist ?PT Short Term Goal 1 - Progress (Week 1): Met ?PT Short Term Goal 2 (Week 1): Pt will consistently perform bed mobility with min assist ?PT Short Term Goal 2 - Progress (Week 1): Met ?PT Short Term Goal 3 (Week 1): Pt will ambulate 45f with mod A +2 ?PT Short Term Goal 3 - Progress (Week 1): Progressing toward goal ?Week 2:  PT Short Term Goal 1 (Week 2): Pt will ambulate 328fwith mod assist ?PT Short Term Goal 2 (Week 2): Pt will perform TUG ?PT Short Term Goal 3 (Week 2): Pt will tolerate standing up to 2 minutes with min assist to improve ability to participate in ADLs ? ?Skilled Therapeutic Interventions/Progress Updates:  ?Patient supine in bed on entrance to room. Patient alert and agreeable to PT session. NT performing brief change in bed and assisted with providing cues to pt to complete bed rolling without physical assist. Pt assisted in suctioning of trach and exterior of trach including gauze protective barrier pad. Requires washing of O2 cuff d/t buildup of secretions.  ? ?Patient with no pain complaint throughout session. ? ?Therapeutic Activity: ?Bed Mobility: Patient performed bed rolling to each side and is able to follow instructions for direction of roll and then maintaining supine. Pt also able to perform bridging in order to position brief  for better donning. NT completes job and this therapist doffs Bil hand mitts [place] with OT and pt following instructions for donning TED hose, socks, pants and new scrub top. No assist required for maintaining balance. Performs supine --> sit with supervision to reach seated position on EOB. VC/ tc and encouragement required for  initiation. ?Transfers: Patient performed sit<>stand and stand pivot transfers throughout session with MinA with +2 for safety in balance as pt has been variable with standing balance. Provided verbal and visual cues for positioning and end target. Impulsive on approach to w/c and not following instructions to maintain UE to RW and to complete pivot stepping for safe position in front of w/c. PT slides sideways into w/c seat. Odor of BM noted and on questioning, pt agrees to toileting.  Stand pivot to toilet with MinA +2 as pt completes transfer to sit on toilet prior to doffing pants. Requires sit<>stand x3 for doffing then donning new brief and pants d/t pt reaching into toilet to assist with BM extraction. OT provides L hand cleaning and pt requiring up to heavy ModA d/t R sided lean with fatigue. ~50% in following instructions for R knee extension as well as bodily extension for upright posture to assist with cleaning.  ? ?Gait Training:  ?Patient ambulated ~10 ft x2 using RW with MinA +2 for balance. Demonstrated impulsivity. Provided vc/ tc for safe technique, RW mgmt, completion of pivot stepping to w/c, upright posture. ? ?Patient seated  in TIS at end of session with brakes locked, belt alarm set, Bil mitts donned, and all needs within reach. ? ? ?Therapy Documentation ?Precautions:  ?Precautions ?Precautions: Fall ?Precaution Comments: posey belt, Bil mitts, R inattention, trach ?Restrictions ?Weight Bearing Restrictions: No ?General: ?  ?Vital Signs: ?Therapy Vitals ?Temp: 98.2 ?F (36.8 ?C) ?Pulse Rate: 92 ?Resp: 14 ?BP: 107/67 ?  Patient Position (if appropriate): Lying ?Oxygen Therapy ?SpO2: 99 % ?O2 Device: Room Air ?O2 Flow Rate (L/min): 5 L/min ?FiO2 (%): 21 % ?Pain: ? No indication of pain throughout session. ? ?Therapy/Group: Co-Treatment ? ?Alger Simons PT, DPT ?04/02/2022, 2:24 PM  ?

## 2022-04-04 NOTE — Progress Notes (Signed)
Speech Language Pathology Daily Session Note ? ?Patient Details  ?Name: Todd Estrada ?MRN: [id number] ?Date of Birth: Jun 07, 1961 ? ?Today's Date: 04/04/2022 ?SLP Individual Time: 7035-0093 ?SLP Individual Time Calculation (min): 62 min ? ?Short Term Goals: ?Week 2: SLP Short Term Goal 1 (Week 2): Patient will respond to basic yes/no questions in regards to biographical and environmental information with mod A verbal/visual cues to achieve 75% accuracy ?SLP Short Term Goal 2 (Week 2): Patient will consistently tolerate PMSV as evidenced by SpO2>92% for 30 minute duration ?SLP Short Term Goal 3 (Week 2): Patient will consume current diet with minimal overt s/sx of aspiration and min A cues for implementation of safe swallowing precautions and strategies ?SLP Short Term Goal 4 (Week 2): Patient will be oriented to person/place/time/situation with min A cues for use of external aids ?SLP Short Term Goal 5 (Week 2): Pt will communicate basic wants/needs via multimodal communication and mod A cues ?SLP Short Term Goal 6 (Week 2): Patient will identify at the word level given field of 3 written choices with mod A to achieve 75% accuracy ? ?Skilled Therapeutic Interventions: Skilled ST treatment focused on swallowing and speech goals. Patient was awake in bed on arrival with teal PMSV in place. O2 saturations 94-96% during duration of 60 minute session with PMSV in place; pulse ~72-78bpm. Pt continues to tolerate PMSV with stable vitals. Patient continuing to require total A to don/doff valve 2/2 being limited to one hand to manipulate. Recommend wearing during all waking hours. Notified LPN. Pt verbally responded to yes/no questions with strong vocal quality and clear communication of needs. Pt attempted to verbalize other responses however limited 2/2 groping and articulatory errors. Patient agreeable to participate in therapeutic PO trials with Dys 2 textures. Patient consumed soft breakfast bar with mildly prolonged yet  effective mastication - suspect edentulous status contributes to mildly reduced efficiency/effectiveness, mildly prolonged transit, trace oral residue post swallow, and no s/s of aspiration. Pt consumed 8oz of nectar thick liquids with intermittent throat clear. Pt also known to take rather large sips requiring min A verbal reminders to take small, single sips. Recommend pt advance to Dys 2 textures, nectar thick liquids at this time; crushed medications. Patient completed oral care with min A using suction swab. Following ~10 minute delay, patient consumed ice chip/water trials resulting in cough during 1/3 occasions with single ice chips, and 50% of occasions with 1/2 tsp of water. Pt required secondary swallow among all trials. Of note, pt continued to exhibit cough and throat clear following 5 minute duration after ice chip/water tsp trials. Symptoms eventually resolved by end of session. SLP kept HOB elevated in 60 degree angle. PMSV removed, as patient indicated that he may take a nap shortly following SLP's departure. Patient was left in bed with soft mit in place with alarm activated and immediate needs met at end of session. Continue per current plan of care.   ?   ?Pain ?Pain Assessment ?Pain Scale: 0-10 ?Pain Score: 0-No pain ? ?Therapy/Group: Individual Therapy ? ?Nanette Wirsing T Sulema Braid ?04/04/2022, 3:23 PM ?

## 2022-04-04 NOTE — Progress Notes (Signed)
Physical Therapy Session Note ? ?Patient Details  ?Name: Todd Estrada ?MRN: [id number] ?Date of Birth: [birth year] ? ?Today's Date: 04/04/2022 ?PT Individual Time: 0902-1001 ?PT Individual Time Calculation (min): 59 min  ? ?Short Term Goals: ?Week 1:  PT Short Term Goals - Week 1 ?PT Short Term Goal 1 (Week 1): pt will trasnfer to [facility] with min assist ?PT Short Term Goal 1 - Progress (Week 1): Met ?PT Short Term Goal 2 (Week 1): Pt will consistently perform bed mobility with min assist ?PT Short Term Goal 2 - Progress (Week 1): Met ?PT Short Term Goal 3 (Week 1): Pt will ambulate 37f with mod A +2 ?PT Short Term Goal 3 - Progress (Week 1): Progressing toward goal ?PT Short Term Goals - Week 2 ?PT Short Term Goal 1 (Week 2): Pt will ambulate 35fwith mod assist ?PT Short Term Goal 2 (Week 2): Pt will perform TUG ?PT Short Term Goal 3 (Week 2): Pt will tolerate standing up to 2 minutes with min assist to improve ability to participate in ADLs ? ?Skilled Therapeutic Interventions/Progress Updates:  ?Patient supine in bed on entrance to room. Patient alert and agreeable to PT session. Cuff removed and cleaned. Left to dry on side table. O2 monitored throughout session on RA with all readings 92-96%. PMV donned throughout session and left on for 15 min break between PT and OT sessions.  ? ?Patient with no pain complaint throughout session. ? ?Therapeutic Activity: ?Bed Mobility: Patient performed supine --> sit with supervision. VC/ tc required for maintaining effort and bringing R hip around to square hips at EOB. Dresses on EOB with MaxA for donning TED hose and grip socks. Pt self chooses to don pants with RLE first requiring MaxA then [place] thread L foot into pants and pulls up with MinA including rise to stand for pulling up pants with BUE. Dons scrub top with ?Transfers: Patient performed sit<>stand and stand pivot transfers throughout session with CGA/ MinA using either w/c armrest, bedrail, RW. Provided verbal  cues for positioning, technique including hand positioning. ? ?Gait Training:  ?Patient ambulated 1832x1/ 12' x1 using RW with CGA/ MinA. Demonstrated good BLE advancement and strong R knee in stance phase. Provided vc/ tc for upright gaze, upright posture, maintaining pace. No drag of R toe noted and no DF assist wrap to RLE. ? ?Neuromuscular Re-ed: ?NMR facilitated during session with focus on standing balance. Pt guided in attaining standing balance and maintaining. Pt guided in forward lean for improved UE support to maintain balance. Sit<>stand throughout session requiring vc/ tc for adequate amount of forward lean, then rise to stand with weight over toes rather than heels as toes noted to come up from floor. Forward reach encouraged for reaching target using RUE then LLE. No LOB and required physical assist to lift RUE to 90deg angle at shoulder flexion. NMR performed for improvements in motor control and coordination, balance, sequencing, judgement, and self confidence/ efficacy in performing all aspects of mobility at highest level of independence.  ? ?Pt's BP assessed throughout session with R arm initially read d/t IV placement in L forearm. Reading at 142/ 126. Moved cuff to L arm with reading at 101/70 after being in seated position for 30 min. Reading at 101/70. After ambulation, pt's BP checked in seated position with reading at 88/69. Closer to normal.  ? ?Patient seated upright  in TIS at end of session with brakes locked, belt alarm set, and all needs within reach.  PMV remains donned for continued training. Pt oriented to time and time of next session with OT in 15 minutes. ? ? ?Therapy Documentation ?Precautions:  ?Precautions ?Precautions: Fall ?Precaution Comments: Bil mitts, R inattention, trach, wean O2 >/=92%, abdominal binder all the time, TEDs only on during therapy then removed ?Restrictions ?Weight Bearing Restrictions: No ?General: ?  ?Vital Signs: ?Therapy Vitals ?Pulse Rate: 77 ?Resp:  16 ?Patient Position (if appropriate): Lying ?Oxygen Therapy ?SpO2: 95 % ?O2 Device: Tracheostomy Collar ?O2 Flow Rate (L/min): 5 L/min ?FiO2 (%): 21 % ?Pain: ?Pain Assessment ?Pain Scale: 0-10 ?Pain Score: 0-No pain ? ?Therapy/Group: Individual Therapy ? ?Alger Simons PT, DPT ?04/04/2022, 10:25 AM  ?

## 2022-04-04 NOTE — Progress Notes (Signed)
RT has put in a request to order a new trach in order to change patients trach today. ?

## 2022-04-04 NOTE — Progress Notes (Signed)
?                                                       PROGRESS NOTE ? ? ?Subjective/Complaints: ? ?Awake and alert in bed , PT in room preparing to get pt OOB ?Labs reviewed ? ?ROS: cannot obtain due to aphasia/dysarthria,Objective: ?  ?No results found. ?Recent Labs  ?  04/03/22 ?0502 04/04/22 ?0263  ?WBC 9.9 8.2  ?HGB 13.1 13.0  ?HCT 39.5 40.0  ?PLT 261 252  ? ? ?Recent Labs  ?  04/04/22 ?7858  ?NA 139  ?K 4.5  ?CL 103  ?CO2 28  ?GLUCOSE 102*  ?BUN 23  ?CREATININE 0.81  ?CALCIUM 9.3  ? ? ? ?Intake/Output Summary (Last 24 hours) at 04/04/2022 0929 ?Last data filed at 04/04/2022 0745 ?Gross per 24 hour  ?Intake 1430 ml  ?Output --  ?Net 1430 ml  ? ?  ? ?Pressure Injury 03/22/22 Heel Right;Lateral Deep Tissue Pressure Injury - Purple or maroon localized area of discolored intact skin or blood-filled blister due to damage of underlying soft tissue from pressure and/or shear. (Active)  ?03/22/22 1500  ?Location: Heel  ?Location Orientation: Right;Lateral  ?Staging: Deep Tissue Pressure Injury - Purple or maroon localized area of discolored intact skin or blood-filled blister due to damage of underlying soft tissue from pressure and/or shear.  ?Wound Description (Comments):   ?Present on Admission: Yes  ? ? ?Physical Exam: ?Vital Signs ?Blood pressure 107/67, pulse 77, temperature 98.2 ?F (36.8 ?C), resp. rate 16, weight 77.9 kg, SpO2 95 %. ? ? ?General: No acute distress ?Mood and affect are appropriate ?Heart: Regular rate and rhythm no rubs murmurs or extra sounds ?Lungs: Clear to auscultation, breathing unlabored, no rales or wheezes ?Abdomen: Positive bowel sounds, soft nontender to palpation, nondistended ?Extremities: No clubbing, cyanosis, or edema ? ?Skin: ?+ PEG site LUQ, no drainage or tenderness  ?Skin: No evidence of breakdown, no evidence of rash ?Neurologic: Alert ?is 4/5 in Left ,  3/5 RUE/RLE. ?Musculoskeletal: Full range of motion in all 4 extremities. No joint swelling ? ?Assessment/Plan: ?1.  Functional deficits which require 3+ hours per day of interdisciplinary therapy in a comprehensive inpatient rehab setting. ?Physiatrist is providing close team supervision and 24 hour management of active medical problems listed below. ?Physiatrist and rehab team continue to assess barriers to discharge/monitor patient progress toward functional and medical goals ? ?Care Tool: ? ?Bathing ?   ?Body parts bathed by patient: Face  ?   ?  ?  ?Bathing assist Assist Level: Supervision/Verbal cueing ?  ?  ?Upper Body Dressing/Undressing ?Upper body dressing   ?What is the patient wearing?: [place] only ?   ?Upper body assist Assist Level: Dependent - Patient 0% ?   ?Lower Body Dressing/Undressing ?Lower body dressing ? ? ?   ?What is the patient wearing?: Incontinence brief ? ?  ? ?Lower body assist Assist for lower body dressing: Minimal Assistance - Patient > 75% ?   ? ?Toileting ?Toileting    ?Toileting assist Assist for toileting: 2 Helpers ?  ?  ?Transfers ?Chair/bed transfer ? ?Transfers assist ?   ? ?Chair/bed transfer assist level: Minimal Assistance - Patient > 75% ?  ?  ?Locomotion ?Ambulation ? ? ?Ambulation assist ? ?   ? ?Assist level: Minimal Assistance - Patient >  75% (MinA +2) ?Assistive device: Walker-rolling ?Max distance: 10  ? ?Walk 10 feet activity ? ? ?Assist ?   ? ?Assist level: Minimal Assistance - Patient > 75% (MinA +2) ?Assistive device: Walker-rolling  ? ?Walk 50 feet activity ? ? ?Assist Walk 50 feet with 2 turns activity did not occur: Safety/medical concerns ? ?  ?   ? ? ?Walk 150 feet activity ? ? ?Assist Walk 150 feet activity did not occur: Safety/medical concerns ? ?  ?  ?  ? ?Walk 10 feet on uneven surface  ?activity ? ? ?Assist Walk 10 feet on uneven surfaces activity did not occur: Safety/medical concerns ? ? ?  ?   ? ?Wheelchair ? ? ? ? ?Assist Is the patient using a wheelchair?: Yes ?Type of Wheelchair: Manual (per PT note, TIS w/c) ?  ? ?Wheelchair assist level: Dependent -  Patient 0% ?Max wheelchair distance: 150  ? ? ?Wheelchair 50 feet with 2 turns activity ? ? ? ?Assist ? ?  ?  ? ? ?Assist Level: Dependent - Patient 0%  ? ?Wheelchair 150 feet activity  ? ? ? ?Assist ?   ? ? ?Assist Level: Dependent - Patient 0%  ? ?Blood pressure 107/67, pulse 77, temperature 98.2 ?F (36.8 ?C), resp. rate 16, weight 77.9 kg, SpO2 95 %. ? ?Medical Problem List and Plan: ?1. Functional deficits secondary to left MCA stroke with right-sided weakness, slurred speech and expressive aphasia on admission ? Continue CIR PT, OT. SLP ?2.  Antithrombotics: ?-DVT/anticoagulation:  Pharmaceutical: Lovenox stopped due to hematuria, use SCDs  ?            -antiplatelet therapy: Plavix and aspirin ?3. Pain Management: Tylenol prn ?4. Mood: LCSW to evaluate and provide emotional support ?            -antipsychotic agents: Seroquel 25 mg q HS ?            --Ativan prn agitation; continue restraints for now ?5. Neuropsych: This patient is not capable of making decisions on his own behalf. ?6. Skin/Wound Care: Routine skin care checks ?            --known pressure injury versus bruise>>? Left buttock ?            --dry, ischemic ulcers bilateral toes ?7. Fluids/Electrolytes/Nutrition: Routine Is and Os ?            --NPO for post stroke dysphagia, advance diet as tolerated ?            --on continuous TFs at 55 cc/hr>>ask dietary to assist with conversion to TF bouses ?            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            --on Plavix and aspirin ?9: CHF: "eventually resume metoprolol 12.5 mg BID", does not appear to be in fluid overload clinically but in DDx ?10: Hypertension: continue TID monitoring ?Vitals:  ? 04/04/22 0544 04/04/22 0834  ?BP:    ?Pulse:  77  ?Resp: 18 16  ?Temp:    ?SpO2: 92% 95%  ? Relatively soft on 4/16 ?11: Hyperlipidemia: continue Lipitor  40 mg daily ?12: Seizure-type activity: continue Keppra 500 mg BID ?13: Alcohol abuse: provide  cessation counseling- ETOH neg on admission  ?            --continue thiamine supplementation ?14: Trach/Aspiration pneumonia and multiple intubation/extubation  for airway protection ?            --s/p tracheostomy 3/22> trach capping per CCM ?            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            -stable, CCM following  ?17: Tobacco abuse: continue nicotine patch ?            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 PET CT examination is recommended ?for further evaluation. Associated left hilar pathologic adenopathy. ?20: Transaminitis:  ? ALT mildly elevated on 4/5 ?21. Left MCA stroke 08/2021 with petechial hemorrhage on aspirin monotherapy ?22. Loose stools- due to Colace? Suggest bulking up stools at this point.  ?23. Leukocytosis post PNA- cont to monitor - remains afebrile but at risk for UTI or PNA  ? 4/9: Keflex started on 4/8 for >100,000 E coli in UC, is sens to Keflex completed on 4/15 ? ?  Latest Ref Rng & Units 04/04/2022  ?  5:28 AM 04/03/2022  ?  5:02 AM 04/02/2022  ?  5:08 AM  ?CBC  ?WBC 4.0 - 10.5 K/uL 8.2   9.9   8.4    ?Hemoglobin 13.0 - 17.0 g/dL 13.0   13.1   13.3    ?Hematocrit 39.0 - 52.0 % 40.0   39.5   39.3    ?Platelets 150 - 400 K/uL 252   261   241    ?  ?24. PAD RIght SFA occlusion VVS eval appreciated, ABI reviewed and shows moderate to severe arterial disease no clincal signs of critical limb ischemia,  cannot express other symptoms such as claudication due to aphasia following CVA, needs OP f/u with VVS  ?25. Lethargy: multifactorial, Likely due to hypoxia and UTI  , now improved ?26. Hematuria- has stopped, tubing has clear  urine, pt likely had external trauma related to freq removal and reapplication of condom cath while receiving lovenox , plavix and ASA, will d/c lovenox ,  ? CBC WNL on 4/16 and 4/17 may d/c daily CBC ? ?LOS: ?13

## 2022-04-04 NOTE — Progress Notes (Signed)
Occupational Therapy Session Note ? ?Patient Details  ?Name: Todd Estrada ?MRN: [id number] ?Date of Birth: December 24, 1960 ? ?Today's Date: 04/04/2022 ?OT Individual Time: 1015-1100 ?OT Individual Time Calculation (min): 45 min  ? ? ?Short Term Goals: ?Week 2:  OT Short Term Goal 1 (Week 2): Pt will complete 2 grooming tasks at sink with mod A. ?OT Short Term Goal 2 (Week 2): Pt will complete sit to stand at sink with mod A. ?OT Short Term Goal 3 (Week 2): Pt will don shirt with max A. ?OT Short Term Goal 4 (Week 2): Pt will transfer to toilet with max A and LRAD. ?OT Short Term Goal 5 (Week 2): Pt will participate in 30 min of OT session in 2/3 attemps. ? ? ?Skilled Therapeutic Interventions/Progress Updates:  ?  Pt sitting in TIS w/c, on RA and SPO2 reading 94%.  Pt shaking head no to all therapeutic measures including self care and going to gym. Pt nodded head to getting back to bed.  Pt shook head when asked if he had any pain.  Stand pivot w/c to EOB with min assist.  Pt pointing at door and impusively attempting to stand up needing multimodal cues to increase safety.  Initially pt rubbing stomach and shaking head "yes" when therapist asked "are you nauseous".  Pt completed sit to supine with min assist and vitals assessed:  BP 115/58 pulse 70 SPO2 on RA 97%. Therapist then noted pt attempting to take shorts off and became apparent that pt had to urinate therefore provided necessary max assist for clothing management and setup of urinal.  Pt nodding "yes" when asked if he had to urinate.  Provided extended time for continence, however pt unsuccessful.  Pt nodded "no" to "does your bladder feel full?".  Pt nodding "yes" once more to "are you nauseous".  Nurse made aware of pts symptoms and gestural yes/no reports.  Call bell and needs in reach, bed alarm on, bilateral hand mitts donned.   ? ?Therapy Documentation ?Precautions:  ?Precautions ?Precautions: Fall ?Precaution Comments: posey belt, Bil mitts, R inattention,  trach ?Restrictions ?Weight Bearing Restrictions: No ? ? ?Therapy/Group: Individual Therapy ? ?Caryl Asp [name] ?04/04/2022, 12:44 PM ?

## 2022-04-05 LAB — CBC WITH DIFFERENTIAL/PLATELET
Abs Immature Granulocytes: 0.11 10*3/uL — ABNORMAL HIGH (ref 0.00–0.07)
Basophils Absolute: 0.1 10*3/uL (ref 0.0–0.1)
Basophils Relative: 1 %
Eosinophils Absolute: 0.4 10*3/uL (ref 0.0–0.5)
Eosinophils Relative: 4 %
HCT: 42.6 % (ref 39.0–52.0)
Hemoglobin: 14.3 g/dL (ref 13.0–17.0)
Immature Granulocytes: 1 %
Lymphocytes Relative: 25 %
Lymphs Abs: 2.3 10*3/uL (ref 0.7–4.0)
MCH: 30.6 pg (ref 26.0–34.0)
MCHC: 33.6 g/dL (ref 30.0–36.0)
MCV: 91.2 fL (ref 80.0–100.0)
Monocytes Absolute: 0.9 10*3/uL (ref 0.1–1.0)
Monocytes Relative: 10 %
Neutro Abs: 5.5 10*3/uL (ref 1.7–7.7)
Neutrophils Relative %: 59 %
Platelets: 229 10*3/uL (ref 150–400)
RBC: 4.67 MIL/uL (ref 4.22–5.81)
RDW: 13.6 % (ref 11.5–15.5)
WBC: 9.2 10*3/uL (ref 4.0–10.5)
nRBC: 0 % (ref 0.0–0.2)

## 2022-04-05 NOTE — Progress Notes (Signed)
Patient ID: Todd Estrada, male   DOB: 1961-08-31, 61 y.o.   MRN: [id number] ? ?Team Conference Report to Patient/Family ? ?Team Conference discussion was reviewed with the patient and caregiver, including goals, any changes in plan of care and target discharge date.  Patient and caregiver express understanding and are in agreement.  The patient has a target discharge date of 04/15/22. ? ?Sw spoke with patient cousin, Todd Estrada and provided team conference updates. Cousin reports he is currently dealing with a lot with his mother being depressed with father's passing. SW arranged family education on Friday 4/21 with patient cousin from 1-4 PM. No additional questions or concerns.  ? ?Dyanne Iha ?04/05/2022, 1:35 PM  ?

## 2022-04-05 NOTE — Progress Notes (Signed)
Physical Therapy Session Note ? ?Patient Details  ?Name: Todd Estrada ?MRN: [id number] ?Date of Birth: 1961-06-12 ? ?Today's Date: 04/05/2022 ?PT Individual Time: 6701-4103 ?PT Individual Time Calculation (min): 34 min  and Today's Date: 04/05/2022 ?PT Missed Time: 11 Minutes ?Missed Time Reason: Patient fatigue ? ?Short Term Goals: ?Week 1:  PT Short Term Goal 1 (Week 1): pt will trasnfer to [facility] with min assist ?PT Short Term Goal 1 - Progress (Week 1): Met ?PT Short Term Goal 2 (Week 1): Pt will consistently perform bed mobility with min assist ?PT Short Term Goal 2 - Progress (Week 1): Met ?PT Short Term Goal 3 (Week 1): Pt will ambulate 69f with mod A +2 ?PT Short Term Goal 3 - Progress (Week 1): Progressing toward goal ?Week 2:  PT Short Term Goal 1 (Week 2): Pt will ambulate 368fwith mod assist ?PT Short Term Goal 2 (Week 2): Pt will perform TUG ?PT Short Term Goal 3 (Week 2): Pt will tolerate standing up to 2 minutes with min assist to improve ability to participate in ADLs ? ?Skilled Therapeutic Interventions/Progress Updates:  ?  pt received in bed and agreeable to therapy. Pt denies pain with yes/no questioning. Supine>sit with supervision with HOB elevated. Pt noted to be on RA on arrival and throughout session, with sats >93% throughout including activity. Pt disconnected from wall air with activity and reconnected upon returning to room. Ted hose donned tot A at beginning of session.  ? ?Supine>sit EOB with supervision. Pt then donned pants and pullover shirt with mod A. Pt able to stand and pull pants over hips with min A and intermittent UE support. Stand pivot transfer to chair with min A HHA. Pt transported to therapy gym for time management and energy conservation. Pt ambulated x32 ft with RW with RUE ace wrapped to RW, min A for balance and +2 w/c follow. Cues for sequencing and encouragement throughout. ? ?After gait trial, pt refused any further activity, indicating that he was fatigued with  yes/no questioning. Pt returned to room. Pt's breakfast recently arrived so pt remained in chair and care was handed off to NT to initiate eating breakfast. Pt remained in chair with humidifier in place, alarm on, HR/O2 monitor plugged in, and NT present.  ? ?Therapy Documentation ?Precautions:  ?Precautions ?Precautions: Fall ?Precaution Comments: Bil mitts, R inattention, trach, wean O2 >/=92%, abdominal binder all the time, TEDs only on during therapy then removed ?Restrictions ?Weight Bearing Restrictions: No ?General: ?PT Amount of Missed Time (min): 11 Minutes ?PT Missed Treatment Reason: Patient fatigue ? ? ? ? ?Therapy/Group: Individual Therapy ? ?Ol[place]utherland4/18/2023, 8:41 AM  ?

## 2022-04-05 NOTE — Progress Notes (Signed)
RT NOTE: patient not available at this time RT will check on patient at next scheduled time. RT will continue to monitor as needed.  ?

## 2022-04-05 NOTE — Progress Notes (Signed)
Occupational Therapy Session Note ? ?Patient Details  ?Name: Todd Estrada ?MRN: [id number] ?Date of Birth: 1961/05/01 ? ?Today's Date: 04/05/2022 ?OT Individual Time: 0122-2411 ?OT Individual Time Calculation (min): 42 min  ? ? ?Short Term Goals: ?Week 1:  OT Short Term Goal 1 (Week 1): Pt will complete 2 grooming tasks at sink with mod A. ?OT Short Term Goal 1 - Progress (Week 1): Not met ?OT Short Term Goal 2 (Week 1): Pt will complete sit to stand at sink with mod A. ?OT Short Term Goal 2 - Progress (Week 1): Not met ?OT Short Term Goal 3 (Week 1): Pt will don shirt with max A. ?OT Short Term Goal 3 - Progress (Week 1): Not met ?OT Short Term Goal 4 (Week 1): Pt will transfer to toilet with max A and LRAD. ?OT Short Term Goal 4 - Progress (Week 1): Not met ? ?Skilled Therapeutic Interventions/Progress Updates:  ?   ?Pt received in TIS with nursing present reporting pt was just up on his feet by himself in the room trying to walk himself to the bathroom. OT and nursing discuss safety measures and OT messages primary tx team at end of session. ?  ?ADL: ?Pt completes ADL at overall MIN A Level with RW sit to stnads, MOD A for ambulatory transfers with RW with cuing for steering/R attention and MAX A for toileting components. Pt requires increased time to have large BM on commode over toilet. Pt able to communicate through shaking head yes/no.  ? ?Therapeutic activity ?Pt taken to ADL apartment to practice furniture transfers, however once up and walking pt sits on EOB shaking head no to attempting the couch. Pt confirms it is from fear of not being able ot get back up despite encouragement/reassurance from OT & +2. Pt completes 2 bouts of walking in hallway with MIN A for straight ahead MOD A for steering and turning with ace bandage wrap around RUE on walker. Pt reporting agreement to sit up in TIS for lunch tilted back and belt alarm on ? ?Pt left at end of session in TIS with exit alarm on, call light in reach and  all needs met ? ? ?Therapy Documentation ?Precautions:  ?Precautions ?Precautions: Fall ?Precaution Comments: Bil mitts, R inattention, trach, wean O2 >/=92%, abdominal binder all the time, TEDs only on during therapy then removed ?Restrictions ?Weight Bearing Restrictions: No ? ?Therapy/Group: Individual Therapy ? ?Lowella Dell Arli Bree ?04/05/2022, 6:52 AM ?

## 2022-04-05 NOTE — Progress Notes (Signed)
?                                                       PROGRESS NOTE ? ? ?Subjective/Complaints: ? ?Alert appetite improving  ?Labs reviewed ? ?ROS: cannot obtain due to aphasia/dysarthria, ?Objective: ?  ?No results found. ?Recent Labs  ?  04/04/22 ?7673 04/05/22 ?4193  ?WBC 8.2 9.2  ?HGB 13.0 14.3  ?HCT 40.0 42.6  ?PLT 252 229  ? ? ?Recent Labs  ?  04/04/22 ?7902  ?NA 139  ?K 4.5  ?CL 103  ?CO2 28  ?GLUCOSE 102*  ?BUN 23  ?CREATININE 0.81  ?CALCIUM 9.3  ? ? ? ?Intake/Output Summary (Last 24 hours) at 04/05/2022 0931 ?Last data filed at 04/04/2022 1855 ?Gross per 24 hour  ?Intake 354 ml  ?Output --  ?Net 354 ml  ? ?  ? ?Pressure Injury 03/22/22 Heel Right;Lateral Deep Tissue Pressure Injury - Purple or maroon localized area of discolored intact skin or blood-filled blister due to damage of underlying soft tissue from pressure and/or shear. (Active)  ?03/22/22 1500  ?Location: Heel  ?Location Orientation: Right;Lateral  ?Staging: Deep Tissue Pressure Injury - Purple or maroon localized area of discolored intact skin or blood-filled blister due to damage of underlying soft tissue from pressure and/or shear.  ?Wound Description (Comments):   ?Present on Admission: Yes  ? ? ?Physical Exam: ?Vital Signs ?Blood pressure 114/90, pulse 69, temperature 98.2 ?F (36.8 ?C), temperature source Axillary, resp. rate 18, weight 77.9 kg, SpO2 95 %. ? ? ?General: No acute distress ?Mood and affect are appropriate ?Heart: Regular rate and rhythm no rubs murmurs or extra sounds ?Lungs: Clear to auscultation, breathing unlabored, no rales or wheezes ?Abdomen: Positive bowel sounds, soft nontender to palpation, nondistended ?Extremities: No clubbing, cyanosis, or edema ? ?Skin: ?+ PEG site LUQ, no drainage or tenderness  ?Skin: No evidence of breakdown, no evidence of rash ?Neurologic: Alert ?is 4/5 in Left ,  3/5 RUE/RLE. ?Musculoskeletal: Full range of motion in all 4 extremities. No joint swelling ? ?Assessment/Plan: ?1. Functional  deficits which require 3+ hours per day of interdisciplinary therapy in a comprehensive inpatient rehab setting. ?Physiatrist is providing close team supervision and 24 hour management of active medical problems listed below. ?Physiatrist and rehab team continue to assess barriers to discharge/monitor patient progress toward functional and medical goals ? ?Care Tool: ? ?Bathing ?   ?Body parts bathed by patient: Face  ?   ?  ?  ?Bathing assist Assist Level: Supervision/Verbal cueing ?  ?  ?Upper Body Dressing/Undressing ?Upper body dressing   ?What is the patient wearing?: [place] only ?   ?Upper body assist Assist Level: Dependent - Patient 0% ?   ?Lower Body Dressing/Undressing ?Lower body dressing ? ? ?   ?What is the patient wearing?: Incontinence brief ? ?  ? ?Lower body assist Assist for lower body dressing: Minimal Assistance - Patient > 75% ?   ? ?Toileting ?Toileting    ?Toileting assist Assist for toileting: 2 Helpers ?  ?  ?Transfers ?Chair/bed transfer ? ?Transfers assist ?   ? ?Chair/bed transfer assist level: Minimal Assistance - Patient > 75% ?  ?  ?Locomotion ?Ambulation ? ? ?Ambulation assist ? ?   ? ?Assist level: Minimal Assistance - Patient > 75% (MinA +2) ?Assistive device: Walker-rolling ?  Max distance: 10  ? ?Walk 10 feet activity ? ? ?Assist ?   ? ?Assist level: Minimal Assistance - Patient > 75% (MinA +2) ?Assistive device: Walker-rolling  ? ?Walk 50 feet activity ? ? ?Assist Walk 50 feet with 2 turns activity did not occur: Safety/medical concerns ? ?  ?   ? ? ?Walk 150 feet activity ? ? ?Assist Walk 150 feet activity did not occur: Safety/medical concerns ? ?  ?  ?  ? ?Walk 10 feet on uneven surface  ?activity ? ? ?Assist Walk 10 feet on uneven surfaces activity did not occur: Safety/medical concerns ? ? ?  ?   ? ?Wheelchair ? ? ? ? ?Assist Is the patient using a wheelchair?: Yes ?Type of Wheelchair: Manual (per PT note, TIS w/c) ?  ? ?Wheelchair assist level: Dependent - Patient  0% ?Max wheelchair distance: 150  ? ? ?Wheelchair 50 feet with 2 turns activity ? ? ? ?Assist ? ?  ?  ? ? ?Assist Level: Dependent - Patient 0%  ? ?Wheelchair 150 feet activity  ? ? ? ?Assist ?   ? ? ?Assist Level: Dependent - Patient 0%  ? ?Blood pressure 114/90, pulse 69, temperature 98.2 ?F (36.8 ?C), temperature source Axillary, resp. rate 18, weight 77.9 kg, SpO2 95 %. ? ?Medical Problem List and Plan: ?1. Functional deficits secondary to left MCA stroke with right-sided weakness, slurred speech and expressive aphasia on admission ? Continue CIR PT, OT. SLP ?Team conf in am  ?2.  Antithrombotics: ?-DVT/anticoagulation:  Pharmaceutical: Lovenox stopped due to hematuria, use SCDs  ?            -antiplatelet therapy: Plavix and aspirin ?3. Pain Management: Tylenol prn ?4. Mood: LCSW to evaluate and provide emotional support ?            -antipsychotic agents: Seroquel 25 mg q HS ?            --Ativan prn agitation; continue restraints for now ?5. Neuropsych: This patient is not capable of making decisions on his own behalf. ?6. Skin/Wound Care: Routine skin care checks ?            --known pressure injury versus bruise>>? Left buttock ?            --dry, ischemic ulcers bilateral toes ?7. Fluids/Electrolytes/Nutrition: Routine Is and Os ?            --NPO for post stroke dysphagia, advance diet as tolerated ?            --on continuous TFs at 55 cc/hr>>ask dietary to assist with conversion to TF bouses ?            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            --on Plavix and aspirin ?9: CHF: "eventually resume metoprolol 12.5 mg BID", does not appear to be in fluid overload  ?10: Hypertension: continue TID monitoring ?Vitals:  ? 04/05/22 0556 04/05/22 0840  ?BP: 114/90   ?Pulse: 69   ?Resp: 18   ?Temp: 98.2 ?F (36.8 ?C)   ?SpO2: 95% 95%  ?Controlled 4/18 ?11: Hyperlipidemia: continue Lipitor  40 mg daily ?12: Seizure-type activity: continue Keppra 500 mg  BID ?13: Alcohol abuse: provide cessation counseling- ETOH neg on admission  ?            --continue thiamine supplementation ?14: Trach/Aspiration pneumonia and multiple intubation/extubation for  airway protection ?            --s/p tracheostomy 3/22> trach capping per CCM ?            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            -stable, CCM following  ?17: Tobacco abuse: continue nicotine patch ?            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 PET CT examination is recommended ?for further evaluation. Associated left hilar pathologic adenopathy. ?Will need to do as OP, will f/u pulmonary  ?20: Transaminitis:  ? ALT mildly elevated on 4/5 ?21. Left MCA stroke 08/2021 with petechial hemorrhage on aspirin monotherapy ?22. Loose stools- due to Colace? Suggest bulking up stools at this point.  ?23. Leukocytosis post PNA- cont to monitor - remains afebrile but at risk for UTI or PNA  ? 4/9: Keflex started on 4/8 for >100,000 E coli in UC, is sens to Keflex completed on 4/15 ? ?  Latest Ref Rng & Units 04/05/2022  ?  5:31 AM 04/04/2022  ?  5:28 AM 04/03/2022  ?  5:02 AM  ?CBC  ?WBC 4.0 - 10.5 K/uL 9.2   8.2   9.9    ?Hemoglobin 13.0 - 17.0 g/dL 14.3   13.0   13.1    ?Hematocrit 39.0 - 52.0 % 42.6   40.0   39.5    ?Platelets 150 - 400 K/uL 229   252   261    ?  ?24. PAD RIght SFA occlusion VVS eval appreciated, ABI reviewed and shows moderate to severe arterial disease no clincal signs of critical limb ischemia,  cannot express other symptoms such as claudication due to aphasia following CVA, needs OP f/u with VVS  ?25. Lethargy: multifactorial, Likely due to  hypoxia and UTI  , now improved ?26. Hematuria- has stopped, tubing has clear urine, pt likely had external trauma related to freq removal and reapplication of condom cath while receiving lovenox , plavix and ASA,

## 2022-04-05 NOTE — Progress Notes (Signed)
Speech Language Pathology Daily Session Note ? ?Patient Details  ?Name: Todd Estrada ?MRN: [id number] ?Date of Birth: Jun 18, 1961 ? ?Today's Date: 04/05/2022 ?SLP Individual Time: 1007-1219 ?SLP Individual Time Calculation (min): 45 min ? ?Short Term Goals: ?Week 2: SLP Short Term Goal 1 (Week 2): Patient will respond to basic yes/no questions in regards to biographical and environmental information with mod A verbal/visual cues to achieve 75% accuracy ?SLP Short Term Goal 2 (Week 2): Patient will consistently tolerate PMSV as evidenced by SpO2>92% for 30 minute duration ?SLP Short Term Goal 3 (Week 2): Patient will consume current diet with minimal overt s/sx of aspiration and min A cues for implementation of safe swallowing precautions and strategies ?SLP Short Term Goal 4 (Week 2): Patient will be oriented to person/place/time/situation with min A cues for use of external aids ?SLP Short Term Goal 5 (Week 2): Pt will communicate basic wants/needs via multimodal communication and mod A cues ?SLP Short Term Goal 6 (Week 2): Patient will identify at the word level given field of 3 written choices with mod A to achieve 75% accuracy ? ?Skilled Therapeutic Interventions: Skilled treatment session focused on communication and cognitive goals. Upon arrival, patient was awake in the wheelchair with his PMSV in place. Patient indicated he wanted to get back into bed but agreeable to ST session. SLP facilitated session by providing Min A verbal cues for sustained attention and functional problem solving during a basic calendar making task. Patient unable to identify the month from a written field of 2 and was also unable to count aloud despite Max A multimodal cues. Patient did answer basic yes/no questions accurately and clearly. Patient transferred back to bed with Min A. Patient was incontinent of urine and followed commands during peri care with extra time and supervision verbal cues. Patient left upright in bed with alarm  on, mitt in place, PMSV removed and all needs within reach. Continue with current plan of care.  ?   ? ?Pain ?No/Denies Pain  ? ?Therapy/Group: Individual Therapy ? ?[name] ?04/05/2022, 3:37 PM ?

## 2022-04-06 MED ORDER — ENSURE ENLIVE PO LIQD
237.0000 mL | Freq: Every day | ORAL | Status: DC
  Administered 2022-04-06 – 2022-04-12 (×7): 237 mL via ORAL

## 2022-04-06 NOTE — Progress Notes (Signed)
Physical Therapy Session Note ? ?Patient Details  ?Name: Todd Estrada ?MRN: [id number] ?Date of Birth: 05-25-1961 ? ?Today's Date: 04/06/2022 ?PT Individual Time: 1660-6004 ?PT Individual Time Calculation (min): 54 min  ? ?Short Term Goals: ?Week 1:  PT Short Term Goal 1 (Week 1): pt will trasnfer to [facility] with min assist ?PT Short Term Goal 1 - Progress (Week 1): Met ?PT Short Term Goal 2 (Week 1): Pt will consistently perform bed mobility with min assist ?PT Short Term Goal 2 - Progress (Week 1): Met ?PT Short Term Goal 3 (Week 1): Pt will ambulate 5f with mod A +2 ?PT Short Term Goal 3 - Progress (Week 1): Progressing toward goal ?Week 2:  PT Short Term Goal 1 (Week 2): Pt will ambulate 352fwith mod assist ?PT Short Term Goal 2 (Week 2): Pt will perform TUG ?PT Short Term Goal 3 (Week 2): Pt will tolerate standing up to 2 minutes with min assist to improve ability to participate in ADLs ?Week 3:    ?Week 4:    ? ?Skilled Therapeutic Interventions/Progress Updates:  ? ?Pt received sitting in WC and agreeable to PT. Pt transported to rehab gym. PT applied Wrist splint to R side of RW. Gait training x 4563fith min assist and WC follow for safety. BP assessed 111/71 following gait training HR 95bpm. Stair management training up/down 1 step x 2 with min assist ascend and mod assist for step length for posterior descent. BITS visual scanning 1 min RUE and 1 min LUE with hand over hand assist on the R side for full ROM and coordination. Cues for visual scanning to the R to locate objects. NUstep transfer with min-CGA for safety with no AD> nustep reciprocal movement training BLE only x 4 min with max assist throughout for initiation on BLE. Pt returned to room and performed stand pivot transfer to bed with CGA on the L side. Sit>supine completed without assist, and left supine in bed with call bell in reach and all needs met.  ? ? ?   ? ?Therapy Documentation ?Precautions:  ?Precautions ?Precautions: Fall ?Precaution  Comments: Bil mitts, R inattention, trach, wean O2 >/=92%, abdominal binder all the time, TEDs only on during therapy then removed ?Restrictions ?Weight Bearing Restrictions: No ? ?Vital Signs: ?Oxygen Therapy ?SpO2: 97 % ?O2 Device: Tracheostomy Collar ?O2 Flow Rate (L/min): 5 L/min ?FiO2 (%): 21 % ?Pain: ? denies ? ?Therapy/Group: Individual Therapy ? ?AusLorie Phenix/19/2023, 2:05 PM  ?

## 2022-04-06 NOTE — Plan of Care (Signed)
?  Problem: RH Expression Communication ?Goal: LTG Patient will verbally express basic/complex needs(SLP) ?Description: LTG:  Patient will verbally express basic/complex needs, wants or ideas with cues  (SLP) ?Outcome: Not Applicable ?Note: Goal not appropriate at this time due to severity of impairments  ?  ?Problem: RH Expression Communication ?Goal: LTG Patient will increase speech intelligibility (SLP) ?Description: LTG: Patient will increase speech intelligibility at word/phrase/conversation level with cues, % of the time (SLP) ?04/06/2022 1500 by Buzzy Han, CCC-SLP ?Flowsheets (Taken 04/06/2022 1500) ?LTG: Patient will increase speech intelligibility (SLP): Minimal Assistance - Patient > 75% ?04/06/2022 1459 by Buzzy Han, CCC-SLP ?Flowsheets (Taken 04/06/2022 1459) ?Level: Word ?  ?

## 2022-04-06 NOTE — Progress Notes (Signed)
Nutrition Follow-up ? ?DOCUMENTATION CODES:  ? ?Non-severe (moderate) malnutrition in context of chronic illness ? ?INTERVENTION:  ?Provide Ensure Enlive po once daily (thickened to nectar thick consistency), each supplement provides 350 kcal and 20 grams of protein. ? ?Encourage PO intake.  ? ?NUTRITION DIAGNOSIS:  ? ?Moderate Malnutrition related to chronic illness (stroke) as evidenced by moderate fat depletion, moderate muscle depletion; ongoing ? ?GOAL:  ? ?Patient will meet greater than or equal to 90% of their needs; met ? ?MONITOR:  ? ?PO intake, Supplement acceptance, Diet advancement, Labs, Weight trends, Skin, I & O's ? ?REASON FOR ASSESSMENT:  ? ?Consult ?Enteral/tube feeding initiation and management ? ?ASSESSMENT:  ? ?61 year old male with history of previous L MCA stroke presents after witnessed seizure-like activity at home. Slurred speech and right-sided weakness noted. CTA concerning for occlusion of left ICA. Pt underwent embolectomy and balloon angioplasty on 3/15. MRI 3/17 with findings worrisome for acute left MCA territory infarcts. Percutaneous tracheostomy on 3/22.  PEG placement performed on 3/24. 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 ? ?Pt continues on trach collar. Diet has been advanced to a dysphagia 2 diet with nectar thick liquids. Meal completion has been 100%. Tube feeding orders have been discontinued. RD to order Ensure to aid in caloric and protein needs. Pt encouraged to eat his food at meals.  ? ?Labs and medications reviewed.  ? ?Diet Order:   ?Diet Order   ? ?       ?  DIET DYS 2 Room service appropriate? Yes; Fluid consistency: Nectar Thick  Diet effective now       ?  ? ?  ?  ? ?  ? ? ?EDUCATION NEEDS:  ? ?Not appropriate for education at this time ? ?Skin:  Skin Assessment: Reviewed RN Assessment ?Skin Integrity Issues:: DTI, Other (Comment) ?DTI: R heel ?Other: R, L  black 2nd toe ? ?Last BM:  4/18 ? ?Height:  ? ?Ht Readings from Last 1 Encounters:  ?03/05/22 6' (1.829 m)  ? ? ?Weight:  ? ?Wt Readings from Last 1 Encounters:  ?03/28/22 77.9 kg  ? ?BMI:  Body mass index is 23.29 kg/m?. ? ?Estimated Nutritional Needs:  ? ?Kcal:  2100-2300 ? ?Protein:  105-120 grams ? ?Fluid:  >/= 2 L/day ? ?Corrin Parker, MS, RD, LDN ?RD pager number/after hours weekend pager number on Amion. ? ?

## 2022-04-06 NOTE — Progress Notes (Signed)
Occupational Therapy Session Note ? ?Patient Details  ?Name: Todd Estrada ?MRN: [id number] ?Date of Birth: 02/18/1961 ? ?Today's Date: 04/06/2022 ?OT Individual Time: 1112-1209 ?OT Individual Time Calculation (min): 57 min  ? ? ?Short Term Goals: ?Week 1:  OT Short Term Goal 1 (Week 1): Pt will complete 2 grooming tasks at sink with mod A. ?OT Short Term Goal 1 - Progress (Week 1): Not met ?OT Short Term Goal 2 (Week 1): Pt will complete sit to stand at sink with mod A. ?OT Short Term Goal 2 - Progress (Week 1): Not met ?OT Short Term Goal 3 (Week 1): Pt will don shirt with max A. ?OT Short Term Goal 3 - Progress (Week 1): Not met ?OT Short Term Goal 4 (Week 1): Pt will transfer to toilet with max A and LRAD. ?OT Short Term Goal 4 - Progress (Week 1): Not met ?Week 2:  OT Short Term Goal 1 (Week 2): Pt will complete 2 grooming tasks at sink with mod A. ?OT Short Term Goal 2 (Week 2): Pt will complete sit to stand at sink with mod A. ?OT Short Term Goal 3 (Week 2): Pt will don shirt with max A. ?OT Short Term Goal 4 (Week 2): Pt will transfer to toilet with max A and LRAD. ?OT Short Term Goal 5 (Week 2): Pt will participate in 30 min of OT session in 2/3 attemps. ? ?Skilled Therapeutic Interventions/Progress Updates:  ?  Pt received semi-reclined in bed, no s/sx pain initially, agreeable to therapy. Session focus on self-care retraining, activity tolerance, RUE NMR, transfer retraining in prep for improved ADL/IADL/func mobility performance + decreased caregiver burden. RT present for suctioning of trach. Came to sitting EOB with min A to progress RLE off bed. Doffed shirt with min A to pull over head, donned new shirt min A to thread RUE. Donned pants with mod A to thread BLE due to initially attempting to don as shirt. Ambulatory toilet transfer with heavy min to light mod A for RW management, ace wrap to maintain R grip on RW, +2 present to assist with guiding RW. Max A for LBD, no void. Ambulated back out > TIS in  similar manner, continues to attempt to turn and sit prior to being completely in front of seat. ? ?Pt stood to participate in 3 rounds of corn hole, retrieving bags from L/R sides and outside BOS. Required max A to facilitiate R grasp/release on bag. Utilized LUE for remaining rounds, initial difficulty comprehending game play. Pt required extended seated rest breaks in between rounds due to fatigue and requesting to return to chair. At end of session indicated discomfort trying to pull pant leg up, communicating need to urinate. Max A for set-up of urinal. ? ?Pt satO2 at 93% throughout session on RA via trach collar.  ? ?Pt left tilted back in TIS with safety belt alarm engaged, call bell in reach, and all immediate needs met.  ? ? ?Therapy Documentation ?Precautions:  ?Precautions ?Precautions: Fall ?Precaution Comments: Bil mitts, R inattention, trach, wean O2 >/=92%, abdominal binder all the time, TEDs only on during therapy then removed ?Restrictions ?Weight Bearing Restrictions: No ? ?Pain: endorses headache, declined intervention ?  ?ADL: See Care Tool for more details. ? ?Therapy/Group: Individual Therapy ? ?Volanda Napoleon MS, OTR/L ? ?04/06/2022, 6:59 AM ?

## 2022-04-06 NOTE — Progress Notes (Addendum)
?                                                       PROGRESS NOTE ? ? ?Subjective/Complaints: ? ?Alert appetite improving , hydration status looks good, some impulsivity noted ?Labs reviewed ? ?ROS: cannot obtain due to aphasia/dysarthria, ?Objective: ?  ?No results found. ?Recent Labs  ?  04/04/22 ?9326 04/05/22 ?7124  ?WBC 8.2 9.2  ?HGB 13.0 14.3  ?HCT 40.0 42.6  ?PLT 252 229  ? ? ?Recent Labs  ?  04/04/22 ?5809  ?NA 139  ?K 4.5  ?CL 103  ?CO2 28  ?GLUCOSE 102*  ?BUN 23  ?CREATININE 0.81  ?CALCIUM 9.3  ? ? ? ?Intake/Output Summary (Last 24 hours) at 04/06/2022 1013 ?Last data filed at 04/06/2022 0813 ?Gross per 24 hour  ?Intake 814 ml  ?Output --  ?Net 814 ml  ? ?  ? ?Pressure Injury 03/22/22 Heel Right;Lateral Deep Tissue Pressure Injury - Purple or maroon localized area of discolored intact skin or blood-filled blister due to damage of underlying soft tissue from pressure and/or shear. (Active)  ?03/22/22 1500  ?Location: Heel  ?Location Orientation: Right;Lateral  ?Staging: Deep Tissue Pressure Injury - Purple or maroon localized area of discolored intact skin or blood-filled blister due to damage of underlying soft tissue from pressure and/or shear.  ?Wound Description (Comments):   ?Present on Admission: Yes  ? ? ?Physical Exam: ?Vital Signs ?Blood pressure 113/72, pulse 62, temperature (!) 97.5 ?F (36.4 ?C), temperature source Oral, resp. rate 18, weight 77.9 kg, SpO2 96 %. ? ? ?General: No acute distress ?Mood and affect are appropriate ?Heart: Regular rate and rhythm no rubs murmurs or extra sounds ?Lungs: Clear to auscultation, breathing unlabored, no rales or wheezes ?Abdomen: Positive bowel sounds, soft nontender to palpation, nondistended ?Extremities: No clubbing, cyanosis, or edema ? ?Skin: ?+ PEG site LUQ, no drainage or tenderness  ?Skin: No evidence of breakdown, no evidence of rash ?Neurologic: Alert ?is 4/5 in Left ,  3/5 RUE/RLE. ?Musculoskeletal: Full range of motion in all 4 extremities.  No joint swelling ? ?Assessment/Plan: ?1. Functional deficits which require 3+ hours per day of interdisciplinary therapy in a comprehensive inpatient rehab setting. ?Physiatrist is providing close team supervision and 24 hour management of active medical problems listed below. ?Physiatrist and rehab team continue to assess barriers to discharge/monitor patient progress toward functional and medical goals ? ?Care Tool: ? ?Bathing ?   ?Body parts bathed by patient: Face  ?   ?  ?  ?Bathing assist Assist Level: Supervision/Verbal cueing ?  ?  ?Upper Body Dressing/Undressing ?Upper body dressing   ?What is the patient wearing?: [place] only ?   ?Upper body assist Assist Level: Dependent - Patient 0% ?   ?Lower Body Dressing/Undressing ?Lower body dressing ? ? ?   ?What is the patient wearing?: Incontinence brief ? ?  ? ?Lower body assist Assist for lower body dressing: Minimal Assistance - Patient > 75% ?   ? ?Toileting ?Toileting    ?Toileting assist Assist for toileting: 2 Helpers ?  ?  ?Transfers ?Chair/bed transfer ? ?Transfers assist ?   ? ?Chair/bed transfer assist level: Minimal Assistance - Patient > 75% ?  ?  ?Locomotion ?Ambulation ? ? ?Ambulation assist ? ?   ? ?Assist level: Minimal Assistance -  Patient > 75% (MinA +2) ?Assistive device: Walker-rolling ?Max distance: 10  ? ?Walk 10 feet activity ? ? ?Assist ?   ? ?Assist level: Minimal Assistance - Patient > 75% (MinA +2) ?Assistive device: Walker-rolling  ? ?Walk 50 feet activity ? ? ?Assist Walk 50 feet with 2 turns activity did not occur: Safety/medical concerns ? ?  ?   ? ? ?Walk 150 feet activity ? ? ?Assist Walk 150 feet activity did not occur: Safety/medical concerns ? ?  ?  ?  ? ?Walk 10 feet on uneven surface  ?activity ? ? ?Assist Walk 10 feet on uneven surfaces activity did not occur: Safety/medical concerns ? ? ?  ?   ? ?Wheelchair ? ? ? ? ?Assist Is the patient using a wheelchair?: Yes ?Type of Wheelchair: Manual (per PT note, TIS w/c) ?   ? ?Wheelchair assist level: Dependent - Patient 0% ?Max wheelchair distance: 150  ? ? ?Wheelchair 50 feet with 2 turns activity ? ? ? ?Assist ? ?  ?  ? ? ?Assist Level: Dependent - Patient 0%  ? ?Wheelchair 150 feet activity  ? ? ? ?Assist ?   ? ? ?Assist Level: Dependent - Patient 0%  ? ?Blood pressure 113/72, pulse 62, temperature (!) 97.5 ?F (36.4 ?C), temperature source Oral, resp. rate 18, weight 77.9 kg, SpO2 96 %. ? ?Medical Problem List and Plan: ?1. Functional deficits secondary to left MCA stroke with right-sided weakness, slurred speech and expressive aphasia on admission ? Continue CIR PT, OT. SLP ?Team conference today please see physician documentation under team conference tab, met with team  to discuss problems,progress, and goals. Formulized individual treatment plan based on medical history, underlying problem and comorbidities. ? ?2.  Antithrombotics: ?-DVT/anticoagulation:  Pharmaceutical: Lovenox stopped due to hematuria, use SCDs  ?            -antiplatelet therapy: Plavix and aspirin ?3. Pain Management: Tylenol prn ?4. Mood: LCSW to evaluate and provide emotional support ?            -antipsychotic agents: Seroquel 25 mg q HS ?            --Ativan prn agitation; continue restraints for now ?5. Neuropsych: This patient is not capable of making decisions on his own behalf. ?6. Skin/Wound Care: Routine skin care checks ?            --known pressure injury versus bruise>>? Left buttock ?            --dry, ischemic ulcers bilateral toes ?7. Fluids/Electrolytes/Nutrition: Routine Is and Os ?            dysphagia improved off TF bolus, taking 100% meals by mouth  ?8: Left ICA occlusion s/p embolectomy by IR on 3/15 ?            --on Plavix and aspirin ?9: CHF: "eventually resume metoprolol 12.5 mg BID", does not appear to be in fluid overload  ?10: Hypertension: continue TID monitoring ?Controlled this am  ?11: Hyperlipidemia: continue Lipitor  40 mg daily ?12: Seizure-type activity: continue  Keppra 500 mg BID ?13: Alcohol abuse: provide cessation counseling- ETOH neg on admission  ?            --continue thiamine supplementation ?14: Trach/Aspiration pneumonia and multiple intubation/extubation for airway protection ?            --s/p tracheostomy 3/22> trach capping per CCM ?            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            -stable, CCM following  ?17: Tobacco abuse: continue nicotine patch ?            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 PET CT examination is recommended ?for further evaluation. Associated left hilar pathologic adenopathy. ?Will need to do as OP, will f/u pulmonary  ?20: Transaminitis:  ? ALT mildly elevated on 4/5 ?21. Left MCA stroke 08/2021 with petechial hemorrhage on aspirin monotherapy ?22. Loose stools- due to Colace? Suggest bulking up stools at this point.  ?23. Leukocytosis post PNA- cont to monitor - remains afebrile but at risk for UTI or PNA  ? 4/9: Keflex started on 4/8 for >100,000 E coli in UC, is sens to Keflex completed on 4/15 ? ?  Latest Ref Rng & Units 04/05/2022  ?  5:31 AM 04/04/2022  ?  5:28 AM 04/03/2022  ?  5:02 AM  ?CBC  ?WBC 4.0 - 10.5 K/uL 9.2   8.2   9.9    ?Hemoglobin 13.0 - 17.0 g/dL 14.3   13.0   13.1    ?Hematocrit 39.0 - 52.0 % 42.6   40.0   39.5    ?Platelets 150 - 400 K/uL 229   252   261    ?  ?24. PAD RIght SFA occlusion VVS eval appreciated, ABI reviewed and shows moderate to severe arterial disease no clincal signs of critical limb ischemia,  cannot express other symptoms such as claudication due to aphasia following CVA, needs OP f/u  with VVS  ?25. Lethargy: multifactorial, Likely due to hypoxia and UTI  , now improved ?26. Hematuria- has stopped, tubing has clear urine, pt likely had external trauma related to freq removal and reapplic

## 2022-04-06 NOTE — Patient Care Conference (Signed)
Inpatient RehabilitationTeam Conference and Plan of Care Update ?Date: 04/06/2022   Time: 10:11 AM  ? ? ?Patient Name: Todd Estrada      ?Medical Record Number: [id number]  ?Date of Birth: 1961-06-01 ?Sex: Male         ?Room/Bed: 4W11C/4W11C-01 ?Payor Info: Payor: Shannon Ewa Gentry / Plan: [place] [employer] [place] / Product Type: *No Product type* /   ? ?Admit Date/Time:  03/22/2022  2:44 PM ? ?Primary Diagnosis:  Acute ischemic left MCA stroke ([place]) ? ?Hospital Problems: Principal Problem: ?  Acute ischemic left MCA stroke ([place]) ?Active Problems: ?  Chronic respiratory failure with hypoxia (HCC) ?  Dyslipidemia ?  Acute lower UTI ?  Dysphagia, post-stroke ? ? ? ?Expected Discharge Date: Expected Discharge Date: 04/15/22 ? ?Team Members Present: ?Physician leading conference: Dr. Alysia Penna ?Social Worker Present: Erlene Quan, BSW ?Nurse Present: Dorien Chihuahua, RN ?PT Present: Barrie Folk, PT ?OT Present: Providence Lanius, OT ?SLP Present: Sherren Kerns, SLP ? ?   Current Status/Progress Goal Weekly Team Focus  ?Bowel/Bladder ? ? incontinent of Bowel/bladder. Last BM 04/05/22  rountine toileting QS  Maintain skin integrity.   ?Swallow/Nutrition/ Hydration ? ? Diet advanced to Dys 2, nectar thick liquids  sup A  tolerance of current diet, ice chips/thin liquid by tsp trials   ?ADL's ? ? max for sink side bathing, min A UBD/LBD, max A wit + 2 for toilet transfer, total A for toileting, main barrier fatigues really easily and orthostatic, apraxic at times  currently mod A ADL and bathroom transfers but will upgrade to min A overall due to improved participation  [place], transfer/balance/self-care retraining, func cog, pt/family/DME/AE education   ?Mobility ? ? supervision assist bed mobility. CGA-min assist transfers. min A with +2 for gait with RW.  Min assist gait. supervision assist bed mobility. CGA trasnfers to gait.  improved attention. family education. endurance. NMR for  RLE/RUE.   ?Communication ? ? Total A for verbal repetition; Max A for vocalization with PMSV, multimodal communication, object naming (approximations only); min-to-mod A for responses to yes/no questions  Comprehension of basic auditory information - Min A; Verbal expression of wants/needs - Min A; Increase speech intelligibility - Min A  Vocalizations with PMSV, multimodal communication, yes/no responses, using communication aids   ?Safety/Cognition/ Behavioral Observations ? mod A orientation, attention  Sustianed attention - Min A; Orientation to person, place, time, and situation - Supervision  orientation, sustained attention, safety awareness   ?Pain ? ? Denies pain when asked, no noted distres  Maintain comfort.  routine pain assesment, qs, & prn; medicate as needed   ?Skin ? ? foot wound  show improvement.  routine monitoring/ treatments/documentation   ? ? ?Discharge Planning:  ?Patient discharging home with cousin. Family education scheduled on Friday 1-4   ?Team Discussion: ?Anticipate capping trach starting 04/07/22 per MD/Pulmonary. PMSV on at all times tolerated well.  Maintains O2 sats with mobility but has trouble naming objects and has no functional communication/vocalizations and is unable to communicate needs; incontinent at times. Doing well with po diet; anticipate discontinuing tube feed supplements. Patient with impulsiveness and poor safety awareness; abandons equipment.  ? ?Patient on target to meet rehab goals: ?See below for changes to goals. Currently needs max assist for bathing and min assist for dressing due to apraxia. Requires max assist for transfers and total assist for toileting. Ortho stasis improved however remains tachy with mobility. Remains on D2 Nectar diet with min  assist cues for size of bites. Goals for discharge set for supervision overall. ? ?*See Care Plan and progress notes for long and short-term goals.  ? ?Revisions to Treatment Plan:  ?Pulmonary follow up on mass   ?Downgraded SLP goals ?Upgraded OT goals to min assist for toileting ?Trial thin liquids without signs/symptoms of aspiration ?  ?Teaching Needs: ?Safety, nutritional needs, medications, trach care/skin care, transfers, toileting, etc  ?Current Barriers to Discharge: ?Home enviroment access/layout, Trach, Lack of/limited family support, and Insurance for SNF coverage ? ?Possible Resolutions to Barriers: ?Family education with cousin ?  ? ? Medical Summary ?Current Status: Oral intake normal, trach weaning to start in am , impulsive ? Barriers to Discharge: Medical stability;Trach;Nutrition means ?  ?Possible Resolutions to [employer]: d/c blous feeds and monitor, trach team to manage weaning, work on activit tolerance ? ? ?Continued Need for Acute Rehabilitation Level of Care: The patient requires daily medical management by a physician with specialized training in physical medicine and rehabilitation for the following reasons: ?Direction of a multidisciplinary physical rehabilitation program to maximize functional independence : Yes ?Medical management of patient stability for increased activity during participation in an intensive rehabilitation regime.: Yes ?Analysis of laboratory values and/or radiology reports with any subsequent need for medication adjustment and/or medical intervention. : Yes ? ? ?I attest that I was present, lead the team conference, and concur with the assessment and plan of the team. ? ? ?Dorien Chihuahua B ?04/06/2022, 3:48 PM  ? ? ? ? ? ? ?

## 2022-04-06 NOTE — Progress Notes (Signed)
Speech Language Pathology Weekly Progress and Session Note ? ?Patient Details  ?Name: Todd Estrada ?MRN: [id number] ?Date of Birth: 1961/09/18 ? ?Beginning of progress report period: March 31, 2023 ?End of progress report period: April 07, 2023 ? ?Today's Date: 04/06/2022 ?SLP Individual Time: 2725-3664 ?SLP Individual Time Calculation (min): 55 min ? ?Short Term Goals: ?Week 2: SLP Short Term Goal 1 (Week 2): Patient will respond to basic yes/no questions in regards to biographical and environmental information with mod A verbal/visual cues to achieve 75% accuracy ?SLP Short Term Goal 1 - Progress (Week 2): Met ?SLP Short Term Goal 2 (Week 2): Patient will consistently tolerate PMSV as evidenced by SpO2>92% for 30 minute duration ?SLP Short Term Goal 2 - Progress (Week 2): Met ?SLP Short Term Goal 3 (Week 2): Patient will consume current diet with minimal overt s/sx of aspiration and min A cues for implementation of safe swallowing precautions and strategies ?SLP Short Term Goal 3 - Progress (Week 2): Not met ?SLP Short Term Goal 4 (Week 2): Patient will be oriented to person/place/time/situation with min A cues for use of external aids ?SLP Short Term Goal 4 - Progress (Week 2): Met ?SLP Short Term Goal 5 (Week 2): Pt will communicate basic wants/needs via multimodal communication and mod A cues ?SLP Short Term Goal 5 - Progress (Week 2): Not met ?SLP Short Term Goal 6 (Week 2): Patient will identify at the word level given field of 3 written choices with mod A to achieve 75% accuracy ?SLP Short Term Goal 6 - Progress (Week 2): Met ? ?  ?New Short Term Goals: ?Week 3: SLP Short Term Goal 1 (Week 3): Patient will consume current diet with minimal overt s/sx of aspiration and min A cues for implementation of safe swallowing precautions and strategies ?SLP Short Term Goal 2 (Week 3): Patient will consume trials of ice chips with minimal overt s/s of aspiration over 2 sessions to assess readiness for repeat MBS. ?SLP  Short Term Goal 3 (Week 3): Patient will demonstrate sustained attention to functional/structured tasks for 30 minutes with Min verbal cues for redirection. ?SLP Short Term Goal 4 (Week 3): Patient will respond to basic yes/no questions in regards to biographical and enviornmental information with min A verbal/visual cues to achieve 75% accuracy ?SLP Short Term Goal 5 (Week 3): Pt will communicate basic wants/needs via multimodal communication and min A cues ? ?Weekly Progress Updates: Patient has made functional gains and has met 4 of 6 STGs this reporting period. Currently, patient is tolerating his PMSV during all waking hours with all vitals remaining WFL and without signs of distress. Patient can answer basic yes/no questions at the word level with 100% accuracy and 100% intelligibility. Patient is essentially nonverbal due to severe aphasia and apraxia but utilizes gestures for communication. Patient also can make choices when given written options. Patient is consuming Dys. 2 textures with nectar-thick liquids with minimal overt s/s of aspiration but requires Mod A for use of swallowing compensatory strategies due to impulsivity. Recommend ongoing trials with SLP to assess readiness for possible repeat MBS. Patient would benefit from continued skilled SLP intervention to maximize his swallowing and cognitive functioning as well as his functional communication prior to discharge.  ?  ? ? ?Intensity: Minumum of 1-2 x/day, 30 to 90 minutes ?Frequency: 3 to 5 out of 7 days ?Duration/Length of Stay: 4/28 ?Treatment/Interventions: Dysphagia/aspiration precaution training;Therapeutic Exercise;Patient/family education;Cognitive remediation/compensation;Cueing hierarchy;Functional tasks;Internal/external aids;Multimodal communication approach;Speech/Language facilitation;Therapeutic Activities;Environmental controls ? ? ?Daily Session ? ?  Skilled Therapeutic Interventions: Skilled treatment session focused on cognitive  and communication goals. Upon arrival, patient was upright in bed and had just completed his breakfast meal. SLP facilitated session by providing thorough oral care via the suction toothbrush without any oral residuals noted. Patient also required Mod verbal and visual cues for thoroughness while cleaning his face. SLP facilitated session by asking orientation questions in which he answered with 100% accuracy when given written choices from a field of 2. Patient also able to identify objects with 100% accuracy when given written choices from a both a field of 2 and 3. Patient with inconsistent reading comprehension, therefore, patient identified all answers correctly when options were read aloud. Patient attempted to name items but unable to despite Max A multimodal cues with groping noted.  Patient also answered basic yes/no questions in regards to wants/needs with 100% accuracy. Patient declined trials of thin liquids or a snack due to indicating he was full from breakfast. Patient left upright in bed with alarm on, mitt in place and PMSV removed. Continue with current plan of care.    ? ? ?Pain ?No/Denies Pain  ? ?Therapy/Group: Individual Therapy ? ?Jesslynn Kruck ?04/06/2022, 3:01 PM ? ? ? ? ? ? ?

## 2022-04-07 DIAGNOSIS — Z930 Tracheostomy status: Secondary | ICD-10-CM

## 2022-04-07 NOTE — Progress Notes (Signed)
? ?NAME:  Todd Estrada, MRN:  [id number], DOB:  [birth year], LOS: 2 ?ADMISSION DATE:  03/22/2022, CONSULTATION DATE:  3/16 ?REFERRING MD:  Erlinda Hong, CHIEF COMPLAINT:  Confusion, seizure  ? ?History of Present Illness:  ?61 y/o male with a history of Left MCA stroke in 8938 with a complicated post stroke course presented on 3/15 with a 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  Intubated on 3/16 for airway protection.  Transferred to Rehab 03/22/2022. ? ?Pertinent  Medical History  ?Hyeprtension ?Left MCA stroke ?Chronic combined CHF ? ?Significant Hospital Events: ?Including procedures, antibiotic start and stop dates in addition to other pertinent events   ?3/15 admitted for acute L MCA stroke due to L ICA occlusion with first time seizure s/p revascularization of L ICA occlusion ?3/16 Intubated for airway protection; started unasyn; echo LVEF 40-45%, RVSP normal ?3/17 MRI Brain showed acute infarct in left frontal, temporal and parietal lobes with findings worrisome for acute infarct, left vert occluded; CT head did not show hemorrhage; respiratory notes thick secretions,  ?3/18 following some commands on exam, extubated the re-intubated due to hypoxemia, inability to clear secretions ?3/19 self extubated overnight, remains on NRB ?3/21 required reintubation for inability to protect airway and aspiration  ?3/22 bedside perc tracheostomy with bronchoscopy  ?3/24 PEG ?3/26 started on trach collar ?3/30 pulmonary critical care called to bedside for patient who has decannulate himself x2.  He is now restrained and most likely needs a sitter ?4/4 transferred to rehab ?4/5 pulmonary critical care consulted ?4/10 Pulmonary Critical care asked to assess for increased oxygen needs ?4/17 Awake, ATC 28%, #6 trach. No complaints.  Tried to drink water from sterile bottle of water on bedside tray, tolerating PMV ? ?Interim History / Subjective:  ?Afebrile  ?On 21% ATC  ?I/O 1.3L UOP, -760ml in last 24 hours  ?Up  working with OT in gym.  PMV in place.  ?Pt reports getting lightheaded with standing.  ? ?Objective   ?Blood pressure (!) 101/56, pulse 83, temperature 98.4 ?F (36.9 ?C), resp. rate 17, weight 77.9 kg, SpO2 92 %. ?   ?FiO2 (%):  [21 %] 21 %  ? ?Intake/Output Summary (Last 24 hours) at 04/07/2022 0933 ?Last data filed at 04/07/2022 0900 ?Gross per 24 hour  ?Intake 576 ml  ?Output 1350 ml  ?Net -774 ml  ? ?Filed Weights  ? 03/26/22 0501 03/27/22 0500 03/28/22 0512  ?Weight: 77 kg 78.2 kg 77.9 kg  ? ? ?Exam: ?General: adult male sitting up in wheelchair, rehab staff at bedside  ?HEENT: MM pink/moist, #6 cuffless trach midline c/d/I, PMV in place, strong voice & cough ?Neuro: Awake, alert, speech clear, working with PT ?CV: s1s2 RRR, no m/r/g ?PULM: non-labored at rest, lungs bilaterally clear with good air entry  ?GI: soft, bsx4 active  ?Extremities: warm/dry, no edema  ?Skin: no rashes or lesions ? ?Resolved Hospital Problem list   ?Septic shock due to aspiration pneumonia > resolved ?Constipation ? ?Assessment & Plan:  ? ?Acute encephalopathy due to left MCA stroke from left ICA occlusion requiring thrombectomy and stent placement on 3/15 ?Seizure disorder ?Agitated delirium on 3/19> risk to self pulling at tubes, lines ?-per primary  ? ? ?Acute hypoxemic respiratory failure due to aspiration pneumonia s/p tracheostomy   ?Thick pulmonary secretions ?Concern for emphysema/COPD > not in history but exam supports ?Intubated 3/18, trach 3/22.  New increase in O2 needs 4/10 in setting of mucus plugging.  At 28% on 4/17.   ? ?-  trach care per protocol  ?-trial of trach capping over the weekend, reassess for possible decannulation on 4/24 ?-if patient develops respiratory distress, remove cap immediately.  Reviewed this with bedside RN. ?-wean O2 for sats >90%  ?-NTS PRN, follow trach secretion volume, color ?-continue pulmicort, brovana, yupelri ?-PT/OT  ?-follow intermittent CXR ?  ? ?Best Practice (right click and  "Reselect all SmartList Selections" daily)  ?Per primary ?Diet/type: tubefeeds ?DVT prophylaxis: LMWH ?GI prophylaxis: PPI ?Lines: N/A ?Foley:  N/A ?Code Status:  full code ?Last date of multidisciplinary goals of care discussion: per primary  ? ?Critical care time: n/a  ?  ? ?Noe Gens, MSN, APRN, NP-C, AGACNP-BC ?[facility] ?04/07/2022, 9:33 AM ? ? ?Please see [email or web address] for pager details.  ? ?From 7A-7P if no response, please call [phone] ?After hours, please call Warren Lacy [phone] ? ? ? ? ? ?

## 2022-04-07 NOTE — Progress Notes (Signed)
?                                                       PROGRESS NOTE ? ? ?Subjective/Complaints: ? ?Aphasic, can answer simple Y/N questions with PMV on trach  ? ?ROS: cannot obtain due to aphasia/dysarthria, ?Objective: ?  ?No results found. ?Recent Labs  ?  04/05/22 ?9702  ?WBC 9.2  ?HGB 14.3  ?HCT 42.6  ?PLT 229  ? ? ?No results for input(s): NA, K, CL, CO2, GLUCOSE, BUN, CREATININE, CALCIUM in the last 72 hours. ? ? ?Intake/Output Summary (Last 24 hours) at 04/07/2022 1019 ?Last data filed at 04/07/2022 0900 ?Gross per 24 hour  ?Intake 576 ml  ?Output 1350 ml  ?Net -774 ml  ? ?  ? ?Pressure Injury 03/22/22 Heel Right;Lateral Deep Tissue Pressure Injury - Purple or maroon localized area of discolored intact skin or blood-filled blister due to damage of underlying soft tissue from pressure and/or shear. (Active)  ?03/22/22 1500  ?Location: Heel  ?Location Orientation: Right;Lateral  ?Staging: Deep Tissue Pressure Injury - Purple or maroon localized area of discolored intact skin or blood-filled blister due to damage of underlying soft tissue from pressure and/or shear.  ?Wound Description (Comments):   ?Present on Admission: Yes  ? ? ?Physical Exam: ?Vital Signs ?Blood pressure (!) 101/56, pulse 83, temperature 98.4 ?F (36.9 ?C), resp. rate 17, weight 77.9 kg, SpO2 92 %. ? ?ENT- trach site CDI ?General: No acute distress ?Mood and affect are appropriate ?Heart: Regular rate and rhythm no rubs murmurs or extra sounds ?Lungs: Clear to auscultation, breathing unlabored, no rales or wheezes ?Abdomen: Positive bowel sounds, soft nontender to palpation, nondistended ?Extremities: No clubbing, cyanosis, or edema ? ?Skin: ?+ PEG site LUQ, no drainage or tenderness  ?Skin: No evidence of breakdown, no evidence of rash ?Neurologic: Alert ?is 4/5 in Left ,  3/5 RUE/RLE. ?Musculoskeletal: Full range of motion in all 4 extremities. No joint swelling ? ?Assessment/Plan: ?1. Functional deficits which require 3+ hours per day of  interdisciplinary therapy in a comprehensive inpatient rehab setting. ?Physiatrist is providing close team supervision and 24 hour management of active medical problems listed below. ?Physiatrist and rehab team continue to assess barriers to discharge/monitor patient progress toward functional and medical goals ? ?Care Tool: ? ?Bathing ?   ?Body parts bathed by patient: Face  ?   ?  ?  ?Bathing assist Assist Level: Supervision/Verbal cueing ?  ?  ?Upper Body Dressing/Undressing ?Upper body dressing   ?What is the patient wearing?: Pull over shirt ?   ?Upper body assist Assist Level: Minimal Assistance - Patient > 75% ?   ?Lower Body Dressing/Undressing ?Lower body dressing ? ? ?   ?What is the patient wearing?: Pants ? ?  ? ?Lower body assist Assist for lower body dressing: Moderate Assistance - Patient 50 - 74% ?   ? ?Toileting ?Toileting    ?Toileting assist Assist for toileting: Maximal Assistance - Patient 25 - 49% ?  ?  ?Transfers ?Chair/bed transfer ? ?Transfers assist ?   ? ?Chair/bed transfer assist level: Minimal Assistance - Patient > 75% ?  ?  ?Locomotion ?Ambulation ? ? ?Ambulation assist ? ?   ? ?Assist level: Minimal Assistance - Patient > 75% (MinA +2) ?Assistive device: Walker-rolling ?Max distance: 10  ? ?Walk 10 feet  activity ? ? ?Assist ?   ? ?Assist level: Minimal Assistance - Patient > 75% (MinA +2) ?Assistive device: Walker-rolling  ? ?Walk 50 feet activity ? ? ?Assist Walk 50 feet with 2 turns activity did not occur: Safety/medical concerns ? ?  ?   ? ? ?Walk 150 feet activity ? ? ?Assist Walk 150 feet activity did not occur: Safety/medical concerns ? ?  ?  ?  ? ?Walk 10 feet on uneven surface  ?activity ? ? ?Assist Walk 10 feet on uneven surfaces activity did not occur: Safety/medical concerns ? ? ?  ?   ? ?Wheelchair ? ? ? ? ?Assist Is the patient using a wheelchair?: Yes ?Type of Wheelchair: Manual (per PT note, TIS w/c) ?  ? ?Wheelchair assist level: Dependent - Patient 0% ?Max wheelchair  distance: 150  ? ? ?Wheelchair 50 feet with 2 turns activity ? ? ? ?Assist ? ?  ?  ? ? ?Assist Level: Dependent - Patient 0%  ? ?Wheelchair 150 feet activity  ? ? ? ?Assist ?   ? ? ?Assist Level: Dependent - Patient 0%  ? ?Blood pressure (!) 101/56, pulse 83, temperature 98.4 ?F (36.9 ?C), resp. rate 17, weight 77.9 kg, SpO2 92 %. ? ?Medical Problem List and Plan: ?1. Functional deficits secondary to left MCA stroke with right-sided weakness, slurred speech and expressive aphasia on admission ? Continue CIR PT, OT. SLP ? ? ?2.  Antithrombotics: ?-DVT/anticoagulation:  Pharmaceutical: Lovenox stopped due to hematuria, use SCDs  ?            -antiplatelet therapy: Plavix and aspirin ?3. Pain Management: Tylenol prn ?4. Mood: LCSW to evaluate and provide emotional support ?            -antipsychotic agents: Seroquel 25 mg q HS ?            --Ativan prn agitation; continue restraints for now ?5. Neuropsych: This patient is not capable of making decisions on his own behalf. ?6. Skin/Wound Care: Routine skin care checks ?            --known pressure injury versus bruise>>? Left buttock ?            --dry, ischemic ulcers bilateral toes ?7. Fluids/Electrolytes/Nutrition: Routine Is and Os ?            dysphagia improved off TF bolus, taking 100% meals by mouth  ?8: Left ICA occlusion s/p embolectomy by IR on 3/15 ?            --on Plavix and aspirin ?9: CHF: "eventually resume metoprolol 12.5 mg BID", does not appear to be in fluid overload  ?10: Hypertension: continue TID monitoring ? ?  04/07/2022  ?  7:31 AM 04/07/2022  ?  3:59 AM 04/07/2022  ?  3:23 AM  ?Vitals with BMI  ?Systolic   790  ?Diastolic   56  ?Pulse 83 87 86  ? Low normal BP off BP meds  ?11: Hyperlipidemia: continue Lipitor  40 mg daily ?12: Seizure-type activity: continue Keppra 500 mg BID ?13: Alcohol abuse: provide cessation counseling- ETOH neg on admission  ?            --continue thiamine supplementation ?14: Trach/Aspiration pneumonia and multiple  intubation/extubation for airway protection ?            --s/p tracheostomy 3/22> trach capping per CCM ?            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            -stable, CCM following  ?17: Tobacco abuse: continue nicotine patch ?            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 PET CT examination is recommended ?for further evaluation. Associated left hilar pathologic adenopathy. ?Will need to do as OP, will f/u pulmonary  ?20: Transaminitis:  ? ALT mildly elevated on 4/5 ?21. Left MCA stroke 08/2021 with petechial hemorrhage on aspirin monotherapy ?22. Loose stools- due to Colace? Suggest bulking up stools at this point.  ?23. Leukocytosis post PNA- resolved 4/20 ? ?  Latest Ref Rng & Units 04/05/2022  ?  5:31 AM 04/04/2022  ?  5:28 AM 04/03/2022  ?  5:02 AM  ?CBC  ?WBC 4.0 - 10.5 K/uL 9.2   8.2   9.9    ?Hemoglobin 13.0 - 17.0 g/dL 14.3   13.0   13.1    ?Hematocrit 39.0 - 52.0 % 42.6   40.0   39.5    ?Platelets 150 - 400 K/uL 229   252   261    ?  ?24. PAD RIght SFA occlusion VVS eval appreciated, ABI reviewed and shows moderate to severe arterial disease no clincal signs of critical limb ischemia,  cannot express other symptoms such as claudication due to aphasia following CVA, needs OP f/u with VVS  ?25. Lethargy: multifactorial, Likely due to hypoxia and UTI  , now improved ?26. Hematuria- has stopped, tubing has clear urine, pt likely had external trauma related to freq removal and reapplication of condom cath while receiving lovenox , plavix and ASA, will d/c lovenox ,  ? CBC WNL on 4/16 and 4/17 ,4/16 dc  daily cbc ? ?LOS: ?16 days ?A FACE TO FACE EVALUATION WAS PERFORMED ? ?Luanna Salk Mikhai Bienvenue ?04/07/2022, 10:19 AM  ? ? ? ?

## 2022-04-07 NOTE — Progress Notes (Signed)
Occupational Therapy Weekly Progress Note ? ?Patient Details  ?Name: Todd Estrada ?MRN: [id number] ?Date of Birth: June 28, 1961 ? ?Beginning of progress report period: March 29, 2022 ?End of progress report period: April 07, 2022 ? ?Today's Date: 04/07/2022 ?OT Individual Time: 6759-1638 ?OT Individual Time Calculation (min): 56 min  ? ? ?Patient has met 4 of 4 short term goals.  Pt has made great progress this week towards LTG. Pt has demonstrated improved alertness, command follow, dynamic standing balance, activity tolerance to presently complete UB/LB ADL at min A level, toileting at max A, ambulatory toilet transfers with RW and min A of 2 .  Pt cont to be primarily limited by fatigue, ideational apraxia, R hemiparesis, global aphasia, cognitive deficits, and R inattention. RUE and hand currently assessed at Brummstrom level III and II respectively. Anticipate 24/7 S and min physical assist required upon DC. ? ? ?Patient continues to demonstrate the following deficits: muscle weakness, decreased cardiorespiratoy endurance, impaired timing and sequencing, abnormal tone, unbalanced muscle activation, decreased coordination, and decreased motor planning, decreased visual motor skills, decreased attention to right and ideational apraxia, decreased initiation, decreased attention, decreased awareness, decreased problem solving, decreased safety awareness, and decreased memory, and decreased sitting balance, decreased standing balance, decreased postural control, hemiplegia, decreased balance strategies, and difficulty maintaining precautions and therefore will continue to benefit from skilled OT intervention to enhance overall performance with BADL and Reduce care partner burden. ? ?Patient progressing toward long term goals..  Continue plan of care. ? ?OT Short Term Goals ?Week 1:  OT Short Term Goal 1 (Week 1): Pt will complete 2 grooming tasks at sink with mod A. ?OT Short Term Goal 1 - Progress (Week 1): Not met ?OT  Short Term Goal 2 (Week 1): Pt will complete sit to stand at sink with mod A. ?OT Short Term Goal 2 - Progress (Week 1): Not met ?OT Short Term Goal 3 (Week 1): Pt will don shirt with max A. ?OT Short Term Goal 3 - Progress (Week 1): Not met ?OT Short Term Goal 4 (Week 1): Pt will transfer to toilet with max A and LRAD. ?OT Short Term Goal 4 - Progress (Week 1): Not met ?Week 2:  OT Short Term Goal 1 (Week 2): Pt will complete 2 grooming tasks at sink with mod A. ?OT Short Term Goal 2 (Week 2): Pt will complete sit to stand at sink with mod A. ?OT Short Term Goal 2 - Progress (Week 2): Met ?OT Short Term Goal 3 (Week 2): Pt will don shirt with max A. ?OT Short Term Goal 3 - Progress (Week 2): Met ?OT Short Term Goal 4 (Week 2): Pt will transfer to toilet with max A and LRAD. ?OT Short Term Goal 4 - Progress (Week 2): Met ?OT Short Term Goal 5 (Week 2): Pt will participate in 30 min of OT session in 2/3 attemps. ?OT Short Term Goal 5 - Progress (Week 2): Met ? ?Skilled Therapeutic Interventions/Progress Updates:  ?  Pt received in TIS, agreeable to therapy. Session focus on R visual scanning, activity tolerance, transfer retraining in prep for improved ADL/IADL/func mobility performance + decreased caregiver burden. Declined need for ADL. Total A TIS transport to and from gym. ? ?Stood for 45 seconds then 50 seconds with overall CGA and RW + R saddle splint, +2 present for safety due to pt being impulsive historically. Pt able to complete the following games with LUE to target sustained attention, standing activity tolerance, R visual scanning,  and command follow: ? ? -Single Target: ?  - 75% accuracy, 2.35 seconds reaction time, 24 hits ?  -73% accuracy, 1/71 reaction time, 25 hits ? -Pt declining further standing, endorses light headedness. Tilted back in TIS BP read at 99/71 (81), donned ted hose and then BP read at 94/80 (86). Deferred further standing due to low BP and pt symptoms. ? ? -Seated, pt completed Bell  Cancellation task, did initially only hit 8 targets on L side, requiring assist to locate remaining targets in the center/R side of screen. Endorses impaired vision, potentially blurry. Able to following commands to participate in visual testing, noted R dysconjugate gaze, but able to scan in all 4 quadrants with no difficulty. Saccades and field appear intact. ? ? Pt endorsing headache, 2/10 pain usually visual guide scale and requesting pain rx. RN later present to administer. ? ? ?Pt left tilted back in TIS with safety belt alarm engaged, call bell in reach, and all immediate needs met. Telesitter on. ? ? ?Therapy Documentation ?Precautions:  ?Precautions ?Precautions: Fall ?Precaution Comments: Bil mitts, R inattention, trach, wean O2 >/=92%, abdominal binder all the time, TEDs only on during therapy then removed ?Restrictions ?Weight Bearing Restrictions: No ? ?Pain: see session note ?  ?ADL: See Care Tool for more details. ? ? ?Therapy/Group: Individual Therapy ? ?Volanda Napoleon MS, OTR/L ? ?04/07/2022, 6:49 AM  ?

## 2022-04-07 NOTE — Progress Notes (Signed)
Pts trach capped per order. Pt is tolerating well at this time. RT to continue to monitor. ?

## 2022-04-07 NOTE — Progress Notes (Signed)
Physical Therapy Session Note ? ?Patient Details  ?Name: Todd Estrada ?MRN: [id number] ?Date of Birth: 05-14-1961 ? ?Today's Date: 04/07/2022 ?PT Individual Time: 3837-7939 ?PT Individual Time Calculation (min): 45 min  ? ?Short Term Goals: ?Week 1:  PT Short Term Goal 1 (Week 1): pt will trasnfer to [facility] with min assist ?PT Short Term Goal 1 - Progress (Week 1): Met ?PT Short Term Goal 2 (Week 1): Pt will consistently perform bed mobility with min assist ?PT Short Term Goal 2 - Progress (Week 1): Met ?PT Short Term Goal 3 (Week 1): Pt will ambulate 54f with mod A +2 ?PT Short Term Goal 3 - Progress (Week 1): Progressing toward goal ?Week 2:  PT Short Term Goal 1 (Week 2): Pt will ambulate 324fwith mod assist ?PT Short Term Goal 2 (Week 2): Pt will perform TUG ?PT Short Term Goal 3 (Week 2): Pt will tolerate standing up to 2 minutes with min assist to improve ability to participate in ADLs ?Week 3:    ? ?Skilled Therapeutic Interventions/Progress Updates:  ? ?Pt received sitting in WC and agreeable to PT. [facility] and R side hand splint x 4521fnd 56f34fth min assist and +2 for WC follow as pt sitting impulsively at end of first bout.  ? ?Standing balance training to perform cross body reach to the R side to improve visual scanning. X 12 with min assist for balance and cues for visual scannin gto the R throughout.  ? ?WC mobility. With LLE x 75ft64f LUE/LLE x 50ft 43f min assist throughout and cues for seuqecning of movement to prevent veer to the R.  ? ?Pt returned to room and performed stand pivot transfer to bed with CGA for safety. Sit>supine completed without assist, and left supine in bed with call bell in reach and all needs met.  ? ?   ? ?Therapy Documentation ?Precautions:  ?Precautions ?Precautions: Fall ?Precaution Comments: Bil mitts, R inattention, trach, wean O2 >/=92%, abdominal binder all the time, TEDs only on during therapy then  removed ?Restrictions ?Weight Bearing Restrictions: No ? ?Vital Signs: ?Therapy Vitals ?Pulse Rate: 76 ?Resp: 17 ?Patient Position (if appropriate): Lying ?Oxygen Therapy ?SpO2: 92 % ?O2 Device: Room Air ? ? ? ? ?Therapy/Group: Individual Therapy ? ?AustinLorie Phenix/2023, 5:52 PM  ?

## 2022-04-07 NOTE — Progress Notes (Signed)
Speech Language Pathology Daily Session Note ? ?Patient Details  ?Name: Todd Estrada ?MRN: [id number] ?Date of Birth: 02-20-1961 ? ?Today's Date: 04/07/2022 ?SLP Individual Time: 7782-4235 ?SLP Individual Time Calculation (min): 55 min ? ?Short Term Goals: ?Week 3: SLP Short Term Goal 1 (Week 3): Patient will consume current diet with minimal overt s/sx of aspiration and min A cues for implementation of safe swallowing precautions and strategies ?SLP Short Term Goal 2 (Week 3): Patient will consume trials of ice chips with minimal overt s/s of aspiration over 2 sessions to assess readiness for repeat MBS. ?SLP Short Term Goal 3 (Week 3): Patient will demonstrate sustained attention to functional/structured tasks for 30 minutes with Min verbal cues for redirection. ?SLP Short Term Goal 4 (Week 3): Patient will respond to basic yes/no questions in regards to biographical and enviornmental information with min A verbal/visual cues to achieve 75% accuracy ?SLP Short Term Goal 5 (Week 3): Pt will communicate basic wants/needs via multimodal communication and min A cues ? ?Skilled Therapeutic Interventions: Skilled treatment session focused on cognitive, speech and dysphagia goals. Upon arrival, patient was awake and upright in the bed and indicated he wanted to get into the wheelchair. Patient unable to find patient's teal PMSV despite extensive search, therefore, SLP provided patient with a purple PMSV to maximize functional communication and overall swallowing function. Patient wore the PMSV throughout the majority of the sessions with vitals remaining WFL. Patient required deep suctioning X 1 due to copious thick secretions. SLP initiated getting patient out of bed. Patient's condom cath had fallen off and patient's brief and bed were both soaked through. Patient followed directions during peri care and changing of his gown with extra time. Patient scooted EOB with extra time and transferred to the wheelchair with Min  A. No impulsivity noted as patient demonstrated appropriate problem solving. Patient noted to have increased attention to RUE this session with increased ability to follow commands (lifting his arm to place the pillow). SLP provided patient with nectar-thick liquids. Patient consumed sips via cup with Mod-Max verbal cues needed for bolus size resulting in overt throat clearing X 1. Recommend patient continue current diet with full supervision. Patient left upright in the wheelchair with alarm on, mitt in place, PMSV removed, and all needs within reach. Continue with current plan of care.  ?   ? ?Pain ?No/Denies Pain  ? ?Therapy/Group: Individual Therapy ? ?[name] ?04/07/2022, 2:41 PM ?

## 2022-04-08 NOTE — Progress Notes (Signed)
Occupational Therapy Session Note ? ?Patient Details  ?Name: Todd Estrada ?MRN: [id number] ?Date of Birth: [birth year] ? ?Today's Date: 04/08/2022 ?OT Individual Time: 9562-1308 ?OT Individual Time Calculation (min): 45 min  ? ? ?Short Term Goals: ?Week 1:  OT Short Term Goal 1 (Week 1): Pt will complete 2 grooming tasks at sink with mod A. ?OT Short Term Goal 1 - Progress (Week 1): Not met ?OT Short Term Goal 2 (Week 1): Pt will complete sit to stand at sink with mod A. ?OT Short Term Goal 2 - Progress (Week 1): Not met ?OT Short Term Goal 3 (Week 1): Pt will don shirt with max A. ?OT Short Term Goal 3 - Progress (Week 1): Not met ?OT Short Term Goal 4 (Week 1): Pt will transfer to toilet with max A and LRAD. ?OT Short Term Goal 4 - Progress (Week 1): Not met ?Week 2:  OT Short Term Goal 1 (Week 2): Pt will complete 2 grooming tasks at sink with mod A. ?OT Short Term Goal 2 (Week 2): Pt will complete sit to stand at sink with mod A. ?OT Short Term Goal 2 - Progress (Week 2): Met ?OT Short Term Goal 3 (Week 2): Pt will don shirt with max A. ?OT Short Term Goal 3 - Progress (Week 2): Met ?OT Short Term Goal 4 (Week 2): Pt will transfer to toilet with max A and LRAD. ?OT Short Term Goal 4 - Progress (Week 2): Met ?OT Short Term Goal 5 (Week 2): Pt will participate in 30 min of OT session in 2/3 attemps. ?OT Short Term Goal 5 - Progress (Week 2): Met ?Week 3:  OT Short Term Goal 1 (Week 3): STG = LTG 2/2 ELOS ? ?Skilled Therapeutic Interventions/Progress Updates:  ?  Pt received semi-reclined in bed, no s/sx pain, agreeable to therapy. Session focus on self-care retraining, activity tolerance, fam education, RUE NMR in prep for improved ADL/IADL/func mobility performance + decreased caregiver burden. ? ?Came to sitting EOB close S. Donned pants with light min A to thread RLE and pull up in back. Stand-pivot CGA > TIS on his L with no AD. Pt brushed hair with S seated at sink. ? ?1:1 NMES applied to supraspinatus and  middle deltoid to help approximate shoulder joint to reduce sublux and reduce pain.  ? ?Ratio 1:3 ?Rate 35 pps ?Waveform- Asymmetric ?Ramp 1.0 ?Pulse 300 ?Intensity-  38 clicks ?Duration -   6 min ? ?No adverse reactions after treatment and is skin intact. Intensity adjusted to tolerance. Max A to facilitate grasp/release on soft sponge. ? ?Cousin then present for last 15 min of scheduled family education. Reviewed pt's current impairments and impact on ADL/mobility performance, emphasized rec for 24/7 S and close S/CGA for mobility. Cousin endorsed that pt did not have any R hand movement , R inattention, and furniture walked prior to this most recent CVA. 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 ?Pt completed ambulatory toilet transfer with RW and R hand ace wrapped to saddle splint, required heavy min A for RW management as pt tended to use LUE on wall only. Ambulated off toilet with min L HHA. ? ?Cousin with no additional questions at this time. ? ?Pt left tilted back in TIS with safety belt alarm engaged, call bell in reach, and all immediate needs met.  ? ? ?Therapy Documentation ?Precautions:  ?Precautions ?Precautions: Fall ?Precaution Comments: Bil mitts, R inattention, trach, wean O2 >/=  92%, abdominal binder all the time, TEDs only on during therapy then removed ?Restrictions ?Weight Bearing Restrictions: No ? ?Pain: no s/sx throughout ?  ?ADL: See Care Tool for more details. ? ? ?Therapy/Group: Individual Therapy ? ?Volanda Napoleon MS, OTR/L ? ?04/08/2022, 6:51 AM ?

## 2022-04-08 NOTE — Progress Notes (Signed)
?                                                       PROGRESS NOTE ? ? ?Subjective/Complaints: ? ?Appreciate CCM note , intake now 100%, off TF , still gets H2O via tube  ? ?ROS: cannot obtain due to aphasia/dysarthria, ?Objective: ?  ?No results found. ?No results for input(s): WBC, HGB, HCT, PLT in the last 72 hours. ? ?No results for input(s): NA, K, CL, CO2, GLUCOSE, BUN, CREATININE, CALCIUM in the last 72 hours. ? ? ?Intake/Output Summary (Last 24 hours) at 04/08/2022 0908 ?Last data filed at 04/07/2022 2202 ?Gross per 24 hour  ?Intake 433 ml  ?Output --  ?Net 433 ml  ? ?  ? ?Pressure Injury 03/22/22 Heel Right;Lateral Deep Tissue Pressure Injury - Purple or maroon localized area of discolored intact skin or blood-filled blister due to damage of underlying soft tissue from pressure and/or shear. (Active)  ?03/22/22 1500  ?Location: Heel  ?Location Orientation: Right;Lateral  ?Staging: Deep Tissue Pressure Injury - Purple or maroon localized area of discolored intact skin or blood-filled blister due to damage of underlying soft tissue from pressure and/or shear.  ?Wound Description (Comments):   ?Present on Admission: Yes  ? ? ?Physical Exam: ?Vital Signs ?Blood pressure 110/65, pulse 73, temperature 98 ?F (36.7 ?C), resp. rate 17, weight 77.9 kg, SpO2 93 %. ? ?ENT- trach site CDI ?General: No acute distress ?Mood and affect are appropriate ?Heart: Regular rate and rhythm no rubs murmurs or extra sounds ?Lungs: Clear to auscultation, breathing unlabored, no rales or wheezes ?Abdomen: Positive bowel sounds, soft nontender to palpation, nondistended ?Extremities: No clubbing, cyanosis, or edema ? ?Skin: ?+ PEG site LUQ, no drainage or tenderness  ?Skin: No evidence of breakdown, no evidence of rash ?Neurologic: Alert ?is 4/5 in Left ,  3/5 RUE/RLE. ?Musculoskeletal: Full range of motion in all 4 extremities. No joint swelling ? ?Assessment/Plan: ?1. Functional deficits which require 3+ hours per day of  interdisciplinary therapy in a comprehensive inpatient rehab setting. ?Physiatrist is providing close team supervision and 24 hour management of active medical problems listed below. ?Physiatrist and rehab team continue to assess barriers to discharge/monitor patient progress toward functional and medical goals ? ?Care Tool: ? ?Bathing ?   ?Body parts bathed by patient: Face  ?   ?  ?  ?Bathing assist Assist Level: Supervision/Verbal cueing ?  ?  ?Upper Body Dressing/Undressing ?Upper body dressing   ?What is the patient wearing?: Pull over shirt ?   ?Upper body assist Assist Level: Minimal Assistance - Patient > 75% ?   ?Lower Body Dressing/Undressing ?Lower body dressing ? ? ?   ?What is the patient wearing?: Pants ? ?  ? ?Lower body assist Assist for lower body dressing: Moderate Assistance - Patient 50 - 74% ?   ? ?Toileting ?Toileting    ?Toileting assist Assist for toileting: Maximal Assistance - Patient 25 - 49% ?  ?  ?Transfers ?Chair/bed transfer ? ?Transfers assist ?   ? ?Chair/bed transfer assist level: Minimal Assistance - Patient > 75% ?  ?  ?Locomotion ?Ambulation ? ? ?Ambulation assist ? ?   ? ?Assist level: Minimal Assistance - Patient > 75% (MinA +2) ?Assistive device: Walker-rolling ?Max distance: 10  ? ?Walk 10 feet activity ? ? ?Assist ?   ? ?  Assist level: Minimal Assistance - Patient > 75% (MinA +2) ?Assistive device: Walker-rolling  ? ?Walk 50 feet activity ? ? ?Assist Walk 50 feet with 2 turns activity did not occur: Safety/medical concerns ? ?  ?   ? ? ?Walk 150 feet activity ? ? ?Assist Walk 150 feet activity did not occur: Safety/medical concerns ? ?  ?  ?  ? ?Walk 10 feet on uneven surface  ?activity ? ? ?Assist Walk 10 feet on uneven surfaces activity did not occur: Safety/medical concerns ? ? ?  ?   ? ?Wheelchair ? ? ? ? ?Assist Is the patient using a wheelchair?: Yes ?Type of Wheelchair: Manual (per PT note, TIS w/c) ?  ? ?Wheelchair assist level: Dependent - Patient 0% ?Max wheelchair  distance: 150  ? ? ?Wheelchair 50 feet with 2 turns activity ? ? ? ?Assist ? ?  ?  ? ? ?Assist Level: Dependent - Patient 0%  ? ?Wheelchair 150 feet activity  ? ? ? ?Assist ?   ? ? ?Assist Level: Dependent - Patient 0%  ? ?Blood pressure 110/65, pulse 73, temperature 98 ?F (36.7 ?C), resp. rate 17, weight 77.9 kg, SpO2 93 %. ? ?Medical Problem List and Plan: ?1. Functional deficits secondary to left MCA stroke with right-sided weakness, slurred speech and expressive aphasia on admission ? Continue CIR PT, OT. SLP ? ? ?2.  Antithrombotics: ?-DVT/anticoagulation:  Pharmaceutical: Lovenox stopped due to hematuria, use SCDs  ?            -antiplatelet therapy: Plavix and aspirin ?3. Pain Management: Tylenol prn ?4. Mood: LCSW to evaluate and provide emotional support ?            -antipsychotic agents: Seroquel 25 mg q HS ?            --Ativan prn agitation; continue restraints for now ?5. Neuropsych: This patient is not capable of making decisions on his own behalf. ?6. Skin/Wound Care: Routine skin care checks ?            --known pressure injury versus bruise>>? Left buttock ?            --dry, ischemic ulcers bilateral toes ?7. Fluids/Electrolytes/Nutrition: Routine Is and Os ?            dysphagia improved off TF bolus, taking 100% meals by mouth  ?8: Left ICA occlusion s/p embolectomy by IR on 3/15 ?            --on Plavix and aspirin ?9: CHF: "eventually resume metoprolol 12.5 mg BID", does not appear to be in fluid overload  ?10: Hypertension: continue TID monitoring ? ?  04/08/2022  ?  7:21 AM 04/08/2022  ?  4:12 AM 04/07/2022  ?  7:37 PM  ?Vitals with BMI  ?Systolic  295 95  ?Diastolic  65 49  ?Pulse 73 82 83  ? Low normal BP off BP meds  ?11: Hyperlipidemia: continue Lipitor  40 mg daily ?12: Seizure-type activity: continue Keppra 500 mg BID ?13: Alcohol abuse: provide cessation counseling- ETOH neg on admission  ?            --continue thiamine supplementation ?14: Trach/Aspiration pneumonia and multiple  intubation/extubation for airway protection ?            --s/p tracheostomy 3/22> trach capping per CCM ?            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            -stable, CCM following  ?17: Tobacco abuse: continue nicotine patch ?            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 PET CT examination is recommended ?for further evaluation. Associated left hilar pathologic adenopathy. ?Will need to do as OP, will f/u pulmonary  ?20: Transaminitis:  ? ALT mildly elevated on 4/5 ?21. Left MCA stroke 08/2021 with petechial hemorrhage on aspirin monotherapy ?22. Loose stools- due to Colace? Suggest bulking up stools at this point.  ?23. Leukocytosis post PNA- resolved 4/20 ? ?  Latest Ref Rng & Units 04/05/2022  ?  5:31 AM 04/04/2022  ?  5:28 AM 04/03/2022  ?  5:02 AM  ?CBC  ?WBC 4.0 - 10.5 K/uL 9.2   8.2   9.9    ?Hemoglobin 13.0 - 17.0 g/dL 14.3   13.0   13.1    ?Hematocrit 39.0 - 52.0 % 42.6   40.0   39.5    ?Platelets 150 - 400 K/uL 229   252   261    ?  ?24. PAD RIght SFA occlusion VVS eval appreciated, ABI reviewed and shows moderate to severe arterial disease no clincal signs of critical limb ischemia,  cannot express other symptoms such as claudication due to aphasia following CVA, needs OP f/u with VVS  ?25. Lethargy: multifactorial, Likely due to hypoxia and UTI  , now improved ?26. Hematuria- has stopped, tubing has clear urine, pt likely had external trauma related to freq removal and reapplication of condom cath while receiving lovenox , plavix and ASA, will d/c lovenox ,  ? CBC WNL on 4/16 and 4/17  ,4/16 dc daily cbc ?27.  Dysphagia improving  PEG placed 03/11/22 eating 100% meals , still needs H2O flush,earliest PEG removal 04/23/22 ?LOS: ?17 days ?A FACE TO FACE EVALUATION WAS PERFORMED ? ?Luanna Salk Rosemond Lyttle ?4/21/

## 2022-04-08 NOTE — Progress Notes (Signed)
Speech Language Pathology Daily Session Note ? ?Patient Details  ?Name: Todd Estrada ?MRN: [id number] ?Date of Birth: 24-Jan-1961 ? ?Today's Date: 04/08/2022 ?SLP Individual Time: 9201-0071 ?SLP Individual Time Calculation (min): 46 min ? ?Short Term Goals: ?Week 3: SLP Short Term Goal 1 (Week 3): Patient will consume current diet with minimal overt s/sx of aspiration and min A cues for implementation of safe swallowing precautions and strategies ?SLP Short Term Goal 2 (Week 3): Patient will consume trials of ice chips with minimal overt s/s of aspiration over 2 sessions to assess readiness for repeat MBS. ?SLP Short Term Goal 3 (Week 3): Patient will demonstrate sustained attention to functional/structured tasks for 30 minutes with Min verbal cues for redirection. ?SLP Short Term Goal 4 (Week 3): Patient will respond to basic yes/no questions in regards to biographical and enviornmental information with min A verbal/visual cues to achieve 75% accuracy ?SLP Short Term Goal 5 (Week 3): Pt will communicate basic wants/needs via multimodal communication and min A cues ? ?Skilled Therapeutic Interventions: Pt received sitting in WC and agreeable to skilled ST intervention with focus on education with cousin. SLP facilitated edu via written, verbal, and demonstration re: aphasia, multimodal communication strategies, cognition, dysphagia, diet recommendations, thickened liquids + ordering information, and swallowing precautions. Cousin reported patient's current communication skills are near baseline following CVA earlier this year and is familiar with ways to effectively communicate with patient. SLP provided demonstration on thickening liquids to nectar thick consistency with simply thick packets. Handouts provided to reinforce education. Cousin verbalized understanding through teach back with all educational topics. Pt consumed large sequential sips of nectar thick liquids by cup with moderate cues to reduce rate and  bolus size. At end of session patient impulsively began to attempt to exit wheelchair and indicated he needed to urinate. Pt responsive to SLP's cues to wait until steps are in place to safely stand. Pt ambulated to bathroom with min A for safety. SLP reinforced to cousin that patient will require 24 hour assist to supervise for safety secondary to language, physical, and cognitive/safety deficits, in addition to impulsiveness. Cousin verbalized understanding. Patient was left in wheelchair and passed off to PT at end of session. Continue per current plan of care.   ?   ?Pain ?Pain Assessment ?Pain Scale: 0-10 ?Pain Score: 0-No pain ? ?Therapy/Group: Individual Therapy ? ?Todd Estrada ?04/08/2022, 4:14 PM ?

## 2022-04-08 NOTE — Progress Notes (Signed)
Physical Therapy Session Note ? ?Patient Details  ?Name: Todd Estrada ?MRN: [id number] ?Date of Birth: 09/07/1961 ? ?Today's Date: 04/08/2022 ?PT Individual Time: 8101-7510 ?PT Individual Time Calculation (min): 43 min  ? ?Short Term Goals: ? ?Week 2:  PT Short Term Goal 1 (Week 2): Pt will ambulate 68f with mod assist ?PT Short Term Goal 2 (Week 2): Pt will perform TUG ?PT Short Term Goal 3 (Week 2): Pt will tolerate standing up to 2 minutes with min assist to improve ability to participate in ADLs ?  ? ?Skilled Therapeutic Interventions/Progress Updates:  ? ?Pt received sitting in WC and agreeable to PT. Cousin present throughout session for educatoin [facility] Gait training with RW and without RW x 161fwith min assist for safety. No overt LOB without AD, but mild foot drag noted on the RLE. Stair management training with LUE support on rail. Pt performed step over step despite cues for step to pattern to pt and cousin. Education for use of BUE supported on rail for anterior descent, but pt electing posterior descent. Car transfer training with CGA from PT with cues for sit>pivot. Pt returned to room and performed stand pivot transfer to bed with min assist from cousin x [id number]>supine completed without assist and left supine in bed with call bell in reach and all needs met.  ?Cousin reports feeling comfortable managing pt at current physical level.  ? ?   ? ?Therapy Documentation ?Precautions:  ?Precautions ?Precautions: Fall ?Precaution Comments: Bil mitts, R inattention, trach, wean O2 >/=92%, abdominal binder all the time, TEDs only on during therapy then removed ?Restrictions ?Weight Bearing Restrictions: No ? ?Vital Signs: ?Therapy Vitals ?Pulse Rate: 99 ?Resp: 18 ?Patient Position (if appropriate): Lying ?Oxygen Therapy ?SpO2: 94 % ?O2 Device: Room Air ?Pain: ?denies ? ? ?Therapy/Group: Individual Therapy ? ?AusLorie Phenix/21/2023, 3:44 PM  ?

## 2022-04-09 NOTE — Progress Notes (Signed)
Occupational Therapy Session Note ? ?Patient Details  ?Name: Todd Estrada ?MRN: [id number] ?Date of Birth: May 16, 1961 ? ?Today's Date: 04/09/2022 ?OT Individual Time: 8883-5844 ?OT Individual Time Calculation (min): 51 min  ? ? ?Short Term Goals: ?Week 1:  OT Short Term Goal 1 (Week 1): Pt will complete 2 grooming tasks at sink with mod A. ?OT Short Term Goal 1 - Progress (Week 1): Not met ?OT Short Term Goal 2 (Week 1): Pt will complete sit to stand at sink with mod A. ?OT Short Term Goal 2 - Progress (Week 1): Not met ?OT Short Term Goal 3 (Week 1): Pt will don shirt with max A. ?OT Short Term Goal 3 - Progress (Week 1): Not met ?OT Short Term Goal 4 (Week 1): Pt will transfer to toilet with max A and LRAD. ?OT Short Term Goal 4 - Progress (Week 1): Not met ?Week 2:  OT Short Term Goal 1 (Week 2): Pt will complete 2 grooming tasks at sink with mod A. ?OT Short Term Goal 2 (Week 2): Pt will complete sit to stand at sink with mod A. ?OT Short Term Goal 2 - Progress (Week 2): Met ?OT Short Term Goal 3 (Week 2): Pt will don shirt with max A. ?OT Short Term Goal 3 - Progress (Week 2): Met ?OT Short Term Goal 4 (Week 2): Pt will transfer to toilet with max A and LRAD. ?OT Short Term Goal 4 - Progress (Week 2): Met ?OT Short Term Goal 5 (Week 2): Pt will participate in 30 min of OT session in 2/3 attemps. ?OT Short Term Goal 5 - Progress (Week 2): Met ?Week 3:  OT Short Term Goal 1 (Week 3): STG = LTG 2/2 ELOS ? ?Skilled Therapeutic Interventions/Progress Updates:  ?  Pt received awake in bed, no s/x pain and appears, agreeable to therapy. Session focus on self-care retraining, activity tolerance, transfer retraining in prep for improved ADL/IADL/func mobility performance + decreased caregiver burden. Agreeable to shower. Came to sitting EOB close S. Ambulatory TTB transfer with overall min HHA with + 2, pt staggering at times requiring intermittent steadying assist. Only washed hair x3 and face when in shower, made no  attempts to bathe remaining body parts despite multimodal cuing.  ? ?Min A to thread RUE to don shirt. Total A to redon brief/pants due to time constraints. ? ?SatO2 at 89-96% on RA throughout session via trach collar. ? ?Pt left tilted back in TIS, with L mitt on, telesitter on, with safety belt alarm engaged, call bell in reach, and all immediate needs met.  ? ? ?Therapy Documentation ?Precautions:  ?Precautions ?Precautions: Fall ?Precaution Comments: Bil mitts, R inattention, trach, wean O2 >/=92%, abdominal binder all the time, TEDs only on during therapy then removed ?Restrictions ?Weight Bearing Restrictions: No ? ?Pain: no s/sx throughout ?  ?ADL: See Care Tool for more details. ? ?Therapy/Group: Individual Therapy ? ?Volanda Napoleon MS, OTR/L ? ?04/09/2022, 6:50 AM ?

## 2022-04-09 NOTE — Progress Notes (Signed)
Speech Language Pathology Daily Session Note ? ?Patient Details  ?Name: Todd Estrada ?MRN: [id number] ?Date of Birth: 11-27-1961 ? ?Today's Date: 04/09/2022 ?SLP Individual Time: 7948-0165 ?SLP Individual Time Calculation (min): 30 min ? ?Short Term Goals: ?Week 3: SLP Short Term Goal 1 (Week 3): Patient will consume current diet with minimal overt s/sx of aspiration and min A cues for implementation of safe swallowing precautions and strategies ?SLP Short Term Goal 2 (Week 3): Patient will consume trials of ice chips with minimal overt s/s of aspiration over 2 sessions to assess readiness for repeat MBS. ?SLP Short Term Goal 3 (Week 3): Patient will demonstrate sustained attention to functional/structured tasks for 30 minutes with Min verbal cues for redirection. ?SLP Short Term Goal 4 (Week 3): Patient will respond to basic yes/no questions in regards to biographical and enviornmental information with min A verbal/visual cues to achieve 75% accuracy ?SLP Short Term Goal 5 (Week 3): Pt will communicate basic wants/needs via multimodal communication and min A cues ? ?Skilled Therapeutic Interventions: ?Pt seen for skilled ST with focus on swallowing and speech goals, pt in bed and agreeable to therapeutic tasks. Pt noted with capped trach, voice strong with SpO2 88-92% throughout session. SLP facilitating trials of ice chips and 1/2 tsp thin liquid, pt tolerating with no overt s/s aspiration or change in vocal quality. Oral and pharyngeal phase of swallow appears mildly sluggish, likely result of general weakness/deconditioning. Pt required occasional second spontaneous swallow during trials. SLP facilitating communication of basic wants/needs by providing mod A cue this date, pt primarily utilizing gestures to attempt to communicate more abstract thoughts. When asked simple yes/no questions, pt responding with either "yes", "no" or "I don't know". With 15 minutes left in session, pt deferring further PO trials,  requesting HOB to be lowered and lights turned off. Pt educated on potential MBS next week to visualize current anatomy and physiology of his swallow, verbalized agreement.  Pt missed 15 minutes of skilled ST due to fatigue. Pt left in bed with bed rails up, telesitter in place and all needs within reach. Cont ST POC. ? ?Pain ?Pain Assessment ?Pain Scale: 0-10 ?Pain Score: 0-No pain ? ?Therapy/Group: Individual Therapy ? ?Dewaine Conger ?04/09/2022, 1:29 PM ?

## 2022-04-09 NOTE — Progress Notes (Signed)
Physical Therapy Weekly Progress Note ? ?Patient Details  ?Name: Todd Estrada ?MRN: [id number] ?Date of Birth: 01/29/1961 ? ?Beginning of progress report period: April 01, 2022 ?End of progress report period: April 09, 2022 ? ?Today's Date: 04/09/2022 ? ? ?Patient has met 3 of 3 short term goals.  Pt is making steady progress towards LTG of min assist overall with min-mod assist for gait. Min assist transfers, and min-mod assist on stairs with cues for safety and attention to the RLE. Pt's caregiver present for education on 4/21 reporting that pt will be manageable at min assist level at home. See treatment note on 4/21.   ? ?Patient continues to demonstrate the following deficits muscle weakness, muscle joint tightness, and muscle paralysis, decreased cardiorespiratoy endurance and decreased oxygen support, impaired timing and sequencing, motor apraxia, and decreased coordination, decreased visual acuity, decreased visual perceptual skills, decreased visual motor skills, and field cut, decreased attention to right, decreased attention, decreased awareness, decreased problem solving, decreased safety awareness, decreased memory, and delayed processing, and decreased sitting balance, decreased standing balance, decreased postural control, hemiplegia, and decreased balance strategies and therefore will continue to benefit from skilled PT intervention to increase functional independence with mobility. ? ?Patient progressing toward long term goals..  Continue plan of care. ? ?PT Short Term Goals ?Week 2:  PT Short Term Goal 1 (Week 2): Pt will ambulate 75f with mod assist ?PT Short Term Goal 1 - Progress (Week 2): Met ?PT Short Term Goal 2 (Week 2): Pt will perform TUG ?PT Short Term Goal 2 - Progress (Week 2): Met ?PT Short Term Goal 3 (Week 2): Pt will tolerate standing up to 2 minutes with min assist to improve ability to participate in ADLs ?PT Short Term Goal 3 - Progress (Week 2): Met ?Week 3:  PT Short Term Goal 1  (Week 3): STG=LTG due to ELOS ? ?Skilled Therapeutic Interventions/Progress Updates:  ?Ambulation/gait training;Discharge planning;Functional mobility training;Psychosocial support;Therapeutic Activities;Visual/perceptual remediation/compensation;Balance/vestibular training;Neuromuscular re-education;Disease management/prevention;Skin care/wound management;Therapeutic Exercise;Wheelchair propulsion/positioning;Cognitive remediation/compensation;DME/adaptive equipment instruction;Pain management;Splinting/orthotics;UE/LE Strength taining/ROM;Community reintegration;Functional electrical stimulation;Stair training;UE/LE Coordination activities;Patient/family education  ? ?Therapy Documentation ?Precautions:  ?Precautions ?Precautions: Fall ?Precaution Comments: Bil mitts, R inattention, trach, wean O2 >/=92%, abdominal binder all the time, TEDs only on during therapy then removed ?Restrictions ?Weight Bearing Restrictions: No ? ?Vital Signs: ?Therapy Vitals ?Pulse Rate: 79 ?Resp: 18 ?Patient Position (if appropriate): Lying ?Oxygen Therapy ?SpO2: 96 % ?O2 Device: Room Air ?Pain: ?Pain Assessment ?Pain Scale: Faces ?Pain Score: 0-No pain ? ?Therapy/Group: Individual Therapy ? ?[name]?04/09/2022, 12:04 PM  ?

## 2022-04-09 NOTE — Progress Notes (Signed)
Physical Therapy Session Note ? ?Patient Details  ?Name: Todd Estrada ?MRN: [id number] ?Date of Birth: 12/17/1961 ? ?Today's Date: 04/09/2022 ?PT Individual Time: 4695-0722 ?PT Individual Time Calculation (min): 38 min  ? ?Short Term Goals: ? ?Week 2:  PT Short Term Goal 1 (Week 2): Pt will ambulate 57f with mod assist ?PT Short Term Goal 1 - Progress (Week 2): Met ?PT Short Term Goal 2 (Week 2): Pt will perform TUG ?PT Short Term Goal 2 - Progress (Week 2): Met ?PT Short Term Goal 3 (Week 2): Pt will tolerate standing up to 2 minutes with min assist to improve ability to participate in ADLs ?PT Short Term Goal 3 - Progress (Week 2): Met ?Week 3:  PT Short Term Goal 1 (Week 3): STG=LTG due to ELOS ? ?Skilled Therapeutic Interventions/Progress Updates:  ? ?Pt received sitting in WC and agreeable to PT, and requesting with gestures that he needs to use bathroom. Ambulatory transfer to toilet with min-mod assist as pit tripping over telesittter support feet. PT required to remove clothing from pt as pt attempting to sit on toilet with brief and pants donned. Sitting balance on BSC with supervision assist and cues for safety to prevent forward LOB. Pt noted to have displaced condom catheter resulting in soiled pants. Clothing management and pericare performed by PT. Pt performed faicial hygiene with wash cloth but did not attempt pericaere. [facility], no LOB in this bout. Pt then requesting to return to bed. Stand pivot with CGA for safety. Sit>supine without assist from PT. Pt left supine in bed with call bell in reach and mit in place per RN request.  ? ?   ? ?Therapy Documentation ?Precautions:  ?Precautions ?Precautions: Fall ?Precaution Comments: Bil mitts, R inattention, trach, wean O2 >/=92%, abdominal binder all the time, TEDs only on during therapy then removed ?Restrictions ?Weight Bearing Restrictions: No ? ?  ?Vital Signs: ?Therapy Vitals ?Pulse Rate: 79 ?Resp: 18 ?Patient  Position (if appropriate): Lying ?Oxygen Therapy ?SpO2: 96 % ?O2 Device: Room Air ?Pain: ?Pain Assessment ?Pain Scale: Faces ?Pain Score: 0-No pain ? ? ?Therapy/Group: Individual Therapy ? ?[name]?04/09/2022, 12:04 PM  ?

## 2022-04-09 NOTE — Progress Notes (Signed)
PROGRESS NOTE   Subjective/Complaints:  No acute events overnight. Patient also being followed by PCCM, with next f/u on 4/24. Patient's intake is now 100 %, off TF but gets free water by tube.  Per PCCM, currently under capping trial for trach to assess for decannulation.    ROS: Unable to obtain due to aphasia/dysarthria.  Objective:   No results found. No results for input(s): WBC, HGB, HCT, PLT in the last 72 hours.  No results for input(s): NA, K, CL, CO2, GLUCOSE, BUN, CREATININE, CALCIUM in the last 72 hours.   Intake/Output Summary (Last 24 hours) at 04/09/2022 1518 Last data filed at 04/09/2022 1220 Gross per 24 hour  Intake 480 ml  Output 700 ml  Net -220 ml     Pressure Injury 03/22/22 Heel Right;Lateral Deep Tissue Pressure Injury - Purple or maroon localized area of discolored intact skin or blood-filled blister due to damage of underlying soft tissue from pressure and/or shear. (Active)  03/22/22 1500  Location: Heel  Location Orientation: Right;Lateral  Staging: Deep Tissue Pressure Injury - Purple or maroon localized area of discolored intact skin or blood-filled blister due to damage of underlying soft tissue from pressure and/or shear.  Wound Description (Comments):   Present on Admission: Yes    Physical Exam: Vital Signs Blood pressure 95/72, pulse 85, temperature 98 F (36.7 C), resp. rate 18, weight 77.9 kg, SpO2 95 %.  General: Not in acute distress. HEENT: Trach site CDI Heart: Regular rate and rhythm, no murmurs or gallops or extra heart sounds. Lungs: Clear to auscultation, breathing even and unlabored, no rales or wheezes Abdomen: +BS 4 quadrants, soft, non-distended, non-tender to palpation. Extremities: No edema, clubbing, or cyanosis. Skin: +PEG in LUQ, without tenderness or drainage, no rash or skin breakdown. Neuro: Alert.  Motor strength 4/5 LUE/LLE, 3/5 RUE/RLE Musculoskeletal: FROM  all extremities, without joint swelling. Psych: Mood and affect appropriate.   Assessment/Plan: 1. Functional deficits which require 3+ hours per day of interdisciplinary therapy in a comprehensive inpatient rehab setting. Physiatrist is providing close team supervision and 24 hour management of active medical problems listed below. Physiatrist and rehab team continue to assess barriers to discharge/monitor patient progress toward functional and medical goals  Care Tool:  Bathing    Body parts bathed by patient: Face   Body parts bathed by helper: Right arm, Left arm, Chest, Abdomen, Front perineal area, Buttocks, Right upper leg, Left upper leg, Right lower leg, Left lower leg     Bathing assist Assist Level: Total Assistance - Patient < 25%     Upper Body Dressing/Undressing Upper body dressing   What is the patient wearing?: Pull over shirt    Upper body assist Assist Level: Minimal Assistance - Patient > 75%    Lower Body Dressing/Undressing Lower body dressing      What is the patient wearing?: Pants     Lower body assist Assist for lower body dressing: Moderate Assistance - Patient 50 - 74%     Toileting Toileting    Toileting assist Assist for toileting: Maximal Assistance - Patient 25 - 49%     Transfers Chair/bed transfer  Transfers assist  Chair/bed transfer assist level: Minimal Assistance - Patient > 75%     Locomotion Ambulation   Ambulation assist      Assist level: Minimal Assistance - Patient > 75% (MinA +2) Assistive device: Walker-rolling Max distance: 10   Walk 10 feet activity   Assist     Assist level: Minimal Assistance - Patient > 75% (MinA +2) Assistive device: Walker-rolling   Walk 50 feet activity   Assist Walk 50 feet with 2 turns activity did not occur: Safety/medical concerns         Walk 150 feet activity   Assist Walk 150 feet activity did not occur: Safety/medical concerns         Walk 10  feet on uneven surface  activity   Assist Walk 10 feet on uneven surfaces activity did not occur: Safety/medical concerns         Wheelchair     Assist Is the patient using a wheelchair?: Yes Type of Wheelchair: Manual (per PT note, TIS w/c)    Wheelchair assist level: Dependent - Patient 0% Max wheelchair distance: 150    Wheelchair 50 feet with 2 turns activity    Assist        Assist Level: Dependent - Patient 0%   Wheelchair 150 feet activity     Assist      Assist Level: Dependent - Patient 0%   Blood pressure 95/72, pulse 85, temperature 98 F (36.7 C), resp. rate 18, weight 77.9 kg, SpO2 95 %.  Medical Problem List and Plan: 1. Functional deficits secondary to left MCA stroke with right-sided weakness, slurred speech and expressive aphasia on admission  Continue CIR PT, OT. SLP   2.  Antithrombotics: -DVT/anticoagulation:  Pharmaceutical: Lovenox stopped due to hematuria, use SCDs  -4/22 Pt tolerating SCD's well, hematuria resolved.             -antiplatelet therapy: Plavix and aspirin 3. Pain Management: Tylenol prn 4. Mood: LCSW to evaluate and provide emotional support             -antipsychotic agents: Seroquel 25 mg q HS             --Ativan prn agitation; continue restraints for now 5. Neuropsych: This patient is not capable of making decisions on his own behalf. 6. Skin/Wound Care: Routine skin care checks             --known pressure injury versus bruise>>? Left buttock             --dry, ischemic ulcers bilateral toes 7. Fluids/Electrolytes/Nutrition: Routine Is and Os             -4/22 dysphagia improved off TF bolus, taking 100% meals by mouth  8: Left ICA occlusion s/p embolectomy by IR on 3/15             --on Plavix and aspirin 9: CHF: "eventually resume metoprolol 12.5 mg BID", does not appear to be in fluid overload  10: Hypertension: continue TID monitoring    04/09/2022    3:05 PM 04/09/2022    2:02 PM 04/09/2022   11:30  AM  Vitals with BMI  Systolic  95   Diastolic  72   Pulse 85 95 79   Low normal BP off BP meds  11: Hyperlipidemia: continue Lipitor  40 mg daily 12: Seizure-type activity: continue Keppra 500 mg BID 13: Alcohol abuse: provide cessation counseling- ETOH neg on admission              --  continue thiamine supplementation 14: Trach/Aspiration pneumonia and multiple intubation/extubation for airway protection             --s/p tracheostomy 3/22> trach capping per CCM             --now with cuffless Shiley #6 with humidified air - using PMV 4/22 Trach capping per PCCM, so far pt is tolerating well. 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 PET CT examination is recommended for further evaluation. Associated left hilar pathologic adenopathy. Will need to do as OP, will f/u pulmonary  20: Transaminitis:   ALT mildly elevated on 4/5 21. Left MCA stroke 08/2021 with petechial hemorrhage on aspirin monotherapy 22. Loose stools- due to Colace? Suggest bulking up stools at this point.  23. Leukocytosis post PNA- resolved 4/20    Latest Ref Rng & Units 04/05/2022    5:31 AM 04/04/2022    5:28 AM 04/03/2022    5:02 AM  CBC  WBC 4.0 - 10.5 K/uL 9.2   8.2   9.9    Hemoglobin 13.0 - 17.0 g/dL 16.1   09.6   04.5    Hematocrit 39.0 - 52.0 % 42.6   40.0   39.5    Platelets 150 - 400 K/uL 229   252   261      24. PAD RIght SFA occlusion VVS eval appreciated, ABI  reviewed and shows moderate to severe arterial disease no clincal signs of critical limb ischemia,  cannot express other symptoms such as claudication due to aphasia following CVA, needs OP f/u with VVS  25. Lethargy: multifactorial, Likely due to hypoxia and UTI  , now improved 26. Hematuria- has stopped, tubing has clear urine, pt likely had external trauma related to freq removal and reapplication of condom cath while receiving lovenox , plavix and ASA, will d/c lovenox ,   CBC WNL on 4/16 and 4/17 ,4/16 dc daily cbc 27.  Dysphagia improving  PEG placed 03/11/22 eating 100% meals , still needs H2O flush,earliest PEG removal 04/23/22 -4/22 100 % meals po, PEG to stay until 04/23/22, with significant improvement of dysphagia.  LOS: 18 days A FACE TO FACE EVALUATION WAS PERFORMED  Tressia Miners 04/09/2022, 3:18 PM

## 2022-04-10 MED ORDER — BACITRACIN ZINC 500 UNIT/GM EX OINT
TOPICAL_OINTMENT | Freq: Three times a day (TID) | CUTANEOUS | Status: DC
  Administered 2022-04-10 – 2022-04-15 (×13): 31.5 via TOPICAL
  Filled 2022-04-10: qty 28.4

## 2022-04-10 NOTE — Progress Notes (Signed)
Speech Language Pathology Daily Session Note ? ?Patient Details  ?Name: Todd Estrada ?MRN: [id number] ?Date of Birth: 01-Oct-1961 ? ?Today's Date: 04/10/2022 ?SLP Individual Time: 3557-3220 ?SLP Individual Time Calculation (min): 55 min ? ?Short Term Goals: ?Week 3: SLP Short Term Goal 1 (Week 3): Patient will consume current diet with minimal overt s/sx of aspiration and min A cues for implementation of safe swallowing precautions and strategies ?SLP Short Term Goal 2 (Week 3): Patient will consume trials of ice chips with minimal overt s/s of aspiration over 2 sessions to assess readiness for repeat MBS. ?SLP Short Term Goal 3 (Week 3): Patient will demonstrate sustained attention to functional/structured tasks for 30 minutes with Min verbal cues for redirection. ?SLP Short Term Goal 4 (Week 3): Patient will respond to basic yes/no questions in regards to biographical and enviornmental information with min A verbal/visual cues to achieve 75% accuracy ?SLP Short Term Goal 5 (Week 3): Pt will communicate basic wants/needs via multimodal communication and min A cues ? ?Skilled Therapeutic Interventions: ? Pt was seen for skilled ST targeting goals for dysphagia and communication.  Pt was in bed upon arrival, awake, alert, and agreeable to participating in [place].  Pt answered "yes" when asked if he wanted to eat his breakfast and consumed ~75% of his meal with mod cues to correct bolus size/volume with both dys 2 solids and liquids.  No overt s/s of aspiration were evident with solids or liquids.  After meal, pt benefited from mod cues to complete oral care in order to improve thoroughness.  SLP then facilitated the session with structured language task to continue working towards improved functional communication.  Pt was able to match an object to a word from a field of three in ~90% of opportunities with supervision cues but was only able to name objects verbally in <25% of opportunities despite max multimodal cues.  In  automatic singing tasks, pt was able to approximate ~75% of "Happy Birthday" after 3-5 repetitions.  During functional conversations pertaining to his immediate personal and/or environmental needs, pt's yes/no responses appeared at least ~75% accurate with min cues to verify.  Pt was left in bed with bed alarm set and call bell within reach.  Continue per current plan of care.    ? ?Pain ?Pain Assessment ?Pain Scale: 0-10 ?Pain Score: 0-No pain ? ?Therapy/Group: Individual Therapy ? ?Makina Skow, Selinda Orion ?04/10/2022, 8:55 AM ?

## 2022-04-10 NOTE — Progress Notes (Signed)
Physical Therapy Session Note ? ?Patient Details  ?Name: Todd Estrada ?MRN: [id number] ?Date of Birth: [birth year] ? ?Today's Date: 04/10/2022 ?PT Individual Time: 3291-9166 ?PT Individual Time Calculation (min): 40 min  ? ?Short Term Goals: ?Week 3:  PT Short Term Goal 1 (Week 3): STG=LTG due to ELOS ? ?Skilled Therapeutic Interventions/Progress Updates:  ?   ?Pt received supine in bed to start - agreeable to PT tx. Does not appear to be in pain. Supine<>sitting EOB with supervision with HOB flat, use of bed rails. Stand<>pivot transfer with minA from EOB to w/c. Donned TEDs with totalA for time.  ? ?Transported to day room rehab gym and assisted to Nustep with minA stand<>pivot transfer. Required setupA on nustep and completed 5 minutes total (2.5 + 2.5 with rest break) resistance set to 4, using BLE and LUE. Nustep activity completed for cardiovascular endurance, LE strengthening, and neural priming for gait training.  ? ?Remainder of session focused on gait training using RW with +2 assist for w/c follow for safety. Pt ambulating variable distances due to fatigue, ~25f + ~349f+ ~5050fNo overt LOB noted, mild R foot drag, decreased safety awareness with RW management and L neglect with positoining L hand in RW splint.  ? ?At end of session, pt noted to smell of urine with suspected urinary incontinence. His sheets were also soiled. NT notified with direct handoff of care with pt sitting in TIS w/c. All needs met.  ? ?Therapy Documentation ?Precautions:  ?Precautions ?Precautions: Fall ?Precaution Comments: Bil mitts, R inattention, trach, wean O2 >/=92%, abdominal binder all the time, TEDs only on during therapy then removed ?Restrictions ?Weight Bearing Restrictions: No ?General: ?  ? ? ?Therapy/Group: Individual Therapy ? ?[name] ?04/10/2022, 7:39 AM  ?

## 2022-04-10 NOTE — Progress Notes (Signed)
Occupational Therapy Session Note ? ?Patient Details  ?Name: Todd Estrada ?MRN: [id number] ?Date of Birth: 1961/02/03 ? ?Today's Date: 04/10/2022 ?OT Individual Time: 1000-1045 ?OT Individual Time Calculation (min): 45 min  ? ? ?Short Term Goals: ?Week 1:  OT Short Term Goal 1 (Week 1): Pt will complete 2 grooming tasks at sink with mod A. ?OT Short Term Goal 1 - Progress (Week 1): Not met ?OT Short Term Goal 2 (Week 1): Pt will complete sit to stand at sink with mod A. ?OT Short Term Goal 2 - Progress (Week 1): Not met ?OT Short Term Goal 3 (Week 1): Pt will don shirt with max A. ?OT Short Term Goal 3 - Progress (Week 1): Not met ?OT Short Term Goal 4 (Week 1): Pt will transfer to toilet with max A and LRAD. ?OT Short Term Goal 4 - Progress (Week 1): Not met ?Week 2:  OT Short Term Goal 1 (Week 2): Pt will complete 2 grooming tasks at sink with mod A. ?OT Short Term Goal 2 (Week 2): Pt will complete sit to stand at sink with mod A. ?OT Short Term Goal 2 - Progress (Week 2): Met ?OT Short Term Goal 3 (Week 2): Pt will don shirt with max A. ?OT Short Term Goal 3 - Progress (Week 2): Met ?OT Short Term Goal 4 (Week 2): Pt will transfer to toilet with max A and LRAD. ?OT Short Term Goal 4 - Progress (Week 2): Met ?OT Short Term Goal 5 (Week 2): Pt will participate in 30 min of OT session in 2/3 attemps. ?OT Short Term Goal 5 - Progress (Week 2): Met ? ?Skilled Therapeutic Interventions/Progress Updates:  ?  1:1 Pt received in bed and gown and brief were soiled from incontinent episode. PT ambulated to the bathroom with hand held assistance with mod A. Pt declined bathing/ showering when offered. Pt assisted with doffing soiled clothing and washing periarea with mod  A for thoroughness. Pt don shirt with min A and mod A for brief and pants- difficulty with threading right LE. Pt ambulated to the sink but while trying to have pt stand at the sink to wash hands- left left gave out and was positioned behind him- requiring  max A to reposition foot (pt unaware of his positioning or how to correct it ) and safely transition into w/c. Participated in washing hands with attention functional use of right hand as gross assist. Pt able to comb hair with cues. Pt taken the dayroom and focus on isolated wrist and thumb movement. PT was able to extend and flex wrist with effort and  more success with visual effort. Pt able to also isolate thumb flexion/ extension. PT transitioned to the kineotron and performed reciprocal movements in LEs with max cues for attention to right LE. Tried to transition into standing but difficulty due to apraxia and then was feeling light headed. Pt returned to sitting and then transferred back into w/c with min A. BP taken in the room and was 106/87 and reports no more dizziness. Left sitting up slightly reclined in the TIS w/c.   ? ?Therapy Documentation ?Precautions:  ?Precautions ?Precautions: Fall ?Precaution Comments: Bil mitts, R inattention, trach, wean O2 >/=92%, abdominal binder all the time, TEDs only on during therapy then removed ?Restrictions ?Weight Bearing Restrictions: No ? ?Pain: ?Pain Assessment ?Pain Scale: 0-10 ?Pain Score: 0-No pain ? ? ?Therapy/Group: Individual Therapy ? ?Nicoletta Ba ?04/10/2022, 12:19 PM ?

## 2022-04-10 NOTE — Progress Notes (Signed)
PROGRESS NOTE   Subjective/Complaints:  No acute events overnight. Patient also being followed by PCCM, with next f/u on 4/24. Patient's intake is now 100 %, off TF but gets free water by tube.  There is some tried residual TF's around stoma and some redness at 94' o  clock at the PEG site without skin breakdown.  Per PCCM, currently under capping trial for trach to assess for decannulation.    ROS: Unable to obtain due to aphasia/dysarthria.  Objective:   No results found. No results for input(s): WBC, HGB, HCT, PLT in the last 72 hours.  No results for input(s): NA, K, CL, CO2, GLUCOSE, BUN, CREATININE, CALCIUM in the last 72 hours.   Intake/Output Summary (Last 24 hours) at 04/10/2022 0855 Last data filed at 04/10/2022 0024 Gross per 24 hour  Intake 300 ml  Output --  Net 300 ml     Pressure Injury 03/22/22 Heel Right;Lateral Deep Tissue Pressure Injury - Purple or maroon localized area of discolored intact skin or blood-filled blister due to damage of underlying soft tissue from pressure and/or shear. (Active)  03/22/22 1500  Location: Heel  Location Orientation: Right;Lateral  Staging: Deep Tissue Pressure Injury - Purple or maroon localized area of discolored intact skin or blood-filled blister due to damage of underlying soft tissue from pressure and/or shear.  Wound Description (Comments):   Present on Admission: Yes    Physical Exam: Vital Signs Blood pressure (!) 100/57, pulse 80, temperature 97.8 F (36.6 C), temperature source Oral, resp. rate 16, weight 77.9 kg, SpO2 97 %.  General: Not in acute distress. HEENT: Trach site CDI Heart: Regular rate and rhythm, no murmurs or gallops or extra heart sounds. Lungs: Clear to auscultation, breathing even and unlabored, no rales or wheezes Abdomen: +BS 4 quadrants, soft, non-distended, non-tender to palpation. Extremities: No edema, clubbing, or cyanosis. Skin:  +PEG in LUQ, without tenderness or drainage, but some dried TF around site with redness at 11 o'clock position of stoma, but without skin breakdown. Neuro: Alert.  Motor strength 4/5 LUE/LLE, 3/5 RUE/RLE Musculoskeletal: FROM all extremities, without joint swelling. Psych: Mood and affect appropriate.   Assessment/Plan: 1. Functional deficits which require 3+ hours per day of interdisciplinary therapy in a comprehensive inpatient rehab setting. Physiatrist is providing close team supervision and 24 hour management of active medical problems listed below. Physiatrist and rehab team continue to assess barriers to discharge/monitor patient progress toward functional and medical goals  Care Tool:  Bathing    Body parts bathed by patient: Face   Body parts bathed by helper: Right arm, Left arm, Chest, Abdomen, Front perineal area, Buttocks, Right upper leg, Left upper leg, Right lower leg, Left lower leg     Bathing assist Assist Level: Total Assistance - Patient < 25%     Upper Body Dressing/Undressing Upper body dressing   What is the patient wearing?: Pull over shirt    Upper body assist Assist Level: Minimal Assistance - Patient > 75%    Lower Body Dressing/Undressing Lower body dressing      What is the patient wearing?: Pants     Lower body assist Assist for lower body dressing: Moderate  Assistance - Patient 50 - 74%     Toileting Toileting    Toileting assist Assist for toileting: Maximal Assistance - Patient 25 - 49%     Transfers Chair/bed transfer  Transfers assist     Chair/bed transfer assist level: Minimal Assistance - Patient > 75%     Locomotion Ambulation   Ambulation assist      Assist level: Minimal Assistance - Patient > 75% (MinA +2) Assistive device: Walker-rolling Max distance: 10   Walk 10 feet activity   Assist     Assist level: Minimal Assistance - Patient > 75% (MinA +2) Assistive device: Walker-rolling   Walk 50 feet  activity   Assist Walk 50 feet with 2 turns activity did not occur: Safety/medical concerns         Walk 150 feet activity   Assist Walk 150 feet activity did not occur: Safety/medical concerns         Walk 10 feet on uneven surface  activity   Assist Walk 10 feet on uneven surfaces activity did not occur: Safety/medical concerns         Wheelchair     Assist Is the patient using a wheelchair?: Yes Type of Wheelchair: Manual (per PT note, TIS w/c)    Wheelchair assist level: Dependent - Patient 0% Max wheelchair distance: 150    Wheelchair 50 feet with 2 turns activity    Assist        Assist Level: Dependent - Patient 0%   Wheelchair 150 feet activity     Assist      Assist Level: Dependent - Patient 0%   Blood pressure (!) 100/57, pulse 80, temperature 97.8 F (36.6 C), temperature source Oral, resp. rate 16, weight 77.9 kg, SpO2 97 %.  Medical Problem List and Plan: 1. Functional deficits secondary to left MCA stroke with right-sided weakness, slurred speech and expressive aphasia on admission  Continue CIR PT, OT. SLP   2.  Antithrombotics: -DVT/anticoagulation:  Pharmaceutical: Lovenox stopped due to hematuria, use SCDs  -4/22 Pt tolerating SCD's well, hematuria resolved.             -antiplatelet therapy: Plavix and aspirin 3. Pain Management: Tylenol prn 4. Mood: LCSW to evaluate and provide emotional support             -antipsychotic agents: Seroquel 25 mg q HS             --Ativan prn agitation; continue restraints for now 5. Neuropsych: This patient is not capable of making decisions on his own behalf. 6. Skin/Wound Care: Routine skin care checks             --known pressure injury versus bruise>>? Left buttock             --dry, ischemic ulcers bilateral toes 7. Fluids/Electrolytes/Nutrition: Routine Is and Os             -4/22 dysphagia improved off TF bolus, taking 100% meals by mouth  -4/23 Continues to take 100 %  meals po. 8: Left ICA occlusion s/p embolectomy by IR on 3/15             --on Plavix and aspirin 9: CHF: "eventually resume metoprolol 12.5 mg BID", does not appear to be in fluid overload  10: Hypertension: continue TID monitoring    04/10/2022    4:21 AM 04/10/2022    3:55 AM 04/10/2022    3:35 AM  Vitals with BMI  Systolic 100 99  Diastolic 57 70   Pulse 80 84 80   Low normal BP off BP meds  11: Hyperlipidemia: continue Lipitor  40 mg daily 12: Seizure-type activity: continue Keppra 500 mg BID 13: Alcohol abuse: provide cessation counseling- ETOH neg on admission              --continue thiamine supplementation 14: Trach/Aspiration pneumonia and multiple intubation/extubation for airway protection             --s/p tracheostomy 3/22> trach capping per CCM             --now with cuffless Shiley #6 with humidified air - using PMV 4/22 Trach capping per PCCM, so far pt is tolerating well. -4/23 Continues to do well with capping trial.  Continues to get scheduled nebs breathing treatments. 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 PET CT examination is recommended for further evaluation. Associated left hilar pathologic adenopathy. Will need to do as OP, will f/u pulmonary  20: Transaminitis:   ALT mildly elevated on 4/5 21. Left MCA stroke 08/2021 with petechial hemorrhage on aspirin monotherapy 22. Loose stools- due to Colace? Suggest bulking up  stools at this point.  23. Leukocytosis post PNA- resolved 4/20    Latest Ref Rng & Units 04/05/2022    5:31 AM 04/04/2022    5:28 AM 04/03/2022    5:02 AM  CBC  WBC 4.0 - 10.5 K/uL 9.2   8.2   9.9    Hemoglobin 13.0 - 17.0 g/dL 16.1   09.6   04.5    Hematocrit 39.0 - 52.0 % 42.6   40.0   39.5    Platelets 150 - 400 K/uL 229   252   261      24. PAD RIght SFA occlusion VVS eval appreciated, ABI reviewed and shows moderate to severe arterial disease no clincal signs of critical limb ischemia,  cannot express other symptoms such as claudication due to aphasia following CVA, needs OP f/u with VVS  25. Lethargy: multifactorial, Likely due to hypoxia and UTI  , now improved 26. Hematuria- has stopped, tubing has clear urine, pt likely had external trauma related to freq removal and reapplication of condom cath while receiving lovenox , plavix and ASA, will d/c lovenox ,   CBC WNL on 4/16 and 4/17 ,4/16 dc daily cbc 27.  Dysphagia improving  PEG placed 03/11/22 eating 100% meals , still needs H2O flush,earliest PEG removal 04/23/22 -4/22 100 % meals po, PEG to stay until 04/23/22, with significant improvement of dysphagia. -4/23 Continued improvement of dysphagia. 28. Redness around PEG tube site: -4/23 some dried TF around site with redness at 11 o'clock position of stoma, but without skin breakdown. -Encourage cleaning of site by nursing, with bacitracin to red area as needed, and placing a clean split gauze to site.  LOS: 19 days A FACE TO FACE EVALUATION WAS PERFORMED  Tressia Miners  04/10/2022, 8:55 AM

## 2022-04-11 LAB — BASIC METABOLIC PANEL
Anion gap: 7 (ref 5–15)
BUN: 20 mg/dL (ref 8–23)
CO2: 30 mmol/L (ref 22–32)
Calcium: 9.3 mg/dL (ref 8.9–10.3)
Chloride: 100 mmol/L (ref 98–111)
Creatinine, Ser: 0.9 mg/dL (ref 0.61–1.24)
GFR, Estimated: >60 mL/min (ref >60)
Glucose, Bld: 102 mg/dL — ABNORMAL HIGH (ref 70–99)
Potassium: 4.2 mmol/L (ref 3.5–5.1)
Sodium: 137 mmol/L (ref 135–145)

## 2022-04-11 MED ORDER — GUAIFENESIN 100 MG/5ML PO LIQD
15.0000 mL | Freq: Four times a day (QID) | ORAL | Status: DC
  Administered 2022-04-11 – 2022-04-14 (×11): 15 mL via ORAL
  Filled 2022-04-11 (×10): qty 15

## 2022-04-11 MED ORDER — FOLIC ACID 1 MG PO TABS
1.0000 mg | ORAL_TABLET | Freq: Every day | ORAL | Status: DC
  Administered 2022-04-12 – 2022-04-15 (×4): 1 mg via ORAL
  Filled 2022-04-11 (×4): qty 1

## 2022-04-11 MED ORDER — ATORVASTATIN CALCIUM 40 MG PO TABS
40.0000 mg | ORAL_TABLET | Freq: Every day | ORAL | Status: DC
  Administered 2022-04-12 – 2022-04-15 (×4): 40 mg via ORAL
  Filled 2022-04-11 (×4): qty 1

## 2022-04-11 MED ORDER — DOCUSATE SODIUM 100 MG PO CAPS
100.0000 mg | ORAL_CAPSULE | Freq: Two times a day (BID) | ORAL | Status: DC
  Administered 2022-04-11 – 2022-04-12 (×3): 100 mg via ORAL
  Filled 2022-04-11 (×3): qty 1

## 2022-04-11 MED ORDER — SORBITOL 70 % SOLN: 30.0000 mL | Freq: Every day | Status: DC | PRN

## 2022-04-11 MED ORDER — DIPHENHYDRAMINE HCL 12.5 MG/5ML PO ELIX: 12.5000 mg | ORAL_SOLUTION | Freq: Four times a day (QID) | ORAL | Status: DC | PRN

## 2022-04-11 MED ORDER — TRAZODONE HCL 50 MG PO TABS: 25.0000 mg | ORAL_TABLET | Freq: Every evening | ORAL | Status: DC | PRN

## 2022-04-11 MED ORDER — ADULT MULTIVITAMIN W/MINERALS CH
1.0000 | ORAL_TABLET | Freq: Every day | ORAL | Status: DC
Start: 2022-04-12 — End: 2022-04-15
  Administered 2022-04-12 – 2022-04-15 (×4): 1 via ORAL
  Filled 2022-04-11 (×4): qty 1

## 2022-04-11 MED ORDER — PANTOPRAZOLE SODIUM 40 MG PO TBEC
40.0000 mg | DELAYED_RELEASE_TABLET | Freq: Every day | ORAL | Status: DC
  Administered 2022-04-11 – 2022-04-15 (×5): 40 mg via ORAL
  Filled 2022-04-11 (×5): qty 1

## 2022-04-11 MED ORDER — ALUM & MAG HYDROXIDE-SIMETH 200-200-20 MG/5ML PO SUSP: 30.0000 mL | ORAL | Status: DC | PRN

## 2022-04-11 MED ORDER — THIAMINE HCL 100 MG PO TABS
100.0000 mg | ORAL_TABLET | Freq: Every day | ORAL | Status: DC
  Administered 2022-04-12 – 2022-04-15 (×4): 100 mg via ORAL
  Filled 2022-04-11 (×4): qty 1

## 2022-04-11 MED ORDER — ACETAMINOPHEN 325 MG PO TABS: 325.0000 mg | ORAL_TABLET | ORAL | Status: DC | PRN

## 2022-04-11 MED ORDER — CLOPIDOGREL BISULFATE 75 MG PO TABS
75.0000 mg | ORAL_TABLET | Freq: Every day | ORAL | Status: DC
  Administered 2022-04-12 – 2022-04-15 (×4): 75 mg via ORAL
  Filled 2022-04-11 (×4): qty 1

## 2022-04-11 MED ORDER — POLYETHYLENE GLYCOL 3350 17 G PO PACK: 17.0000 g | PACK | Freq: Every day | ORAL | Status: DC | PRN

## 2022-04-11 MED ORDER — ASPIRIN 81 MG PO CHEW
81.0000 mg | CHEWABLE_TABLET | Freq: Every day | ORAL | Status: DC
  Administered 2022-04-12 – 2022-04-15 (×4): 81 mg via ORAL
  Filled 2022-04-11 (×4): qty 1

## 2022-04-11 MED ORDER — METHOCARBAMOL 500 MG PO TABS: 500.0000 mg | ORAL_TABLET | Freq: Four times a day (QID) | ORAL | Status: DC | PRN

## 2022-04-11 NOTE — Progress Notes (Addendum)
Patient ID: Todd Estrada, male   DOB: 05-Sep-1961, 61 y.o.   MRN: [id number] ? ?HH referral sent to [facility] ?-Declined due to staffing ? ?[facility] referral sent to [facility]  ?-patient approved ?

## 2022-04-11 NOTE — Progress Notes (Signed)
Patient ID: Todd Estrada, male   DOB: 1961/10/27, 61 y.o.   MRN: [id number] ? 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  ?

## 2022-04-11 NOTE — Progress Notes (Signed)
Pt decannulated per MD order. Stoma care done and dressing placed over stoma. ?

## 2022-04-11 NOTE — Progress Notes (Signed)
?                                                       PROGRESS NOTE ? ? ?Subjective/Complaints: ? ?Tolerates trach capping , per SLP resp tx is planning to d/c trach today  ?DIscussed po meds with SLP ? ?ROS: Unable to obtain due to aphasia/dysarthria. ? ?Objective: ?  ?No results found. ?No results for input(s): WBC, HGB, HCT, PLT in the last 72 hours. ? ?Recent Labs  ?  04/11/22 ?0634  ?NA 137  ?K 4.2  ?CL 100  ?CO2 30  ?GLUCOSE 102*  ?BUN 20  ?CREATININE 0.90  ?CALCIUM 9.3  ? ? ? ?Intake/Output Summary (Last 24 hours) at 04/11/2022 1055 ?Last data filed at 04/11/2022 0705 ?Gross per 24 hour  ?Intake 360 ml  ?Output 802 ml  ?Net -442 ml  ? ?  ? ?Pressure Injury 03/22/22 Heel Right;Lateral Deep Tissue Pressure Injury - Purple or maroon localized area of discolored intact skin or blood-filled blister due to damage of underlying soft tissue from pressure and/or shear. (Active)  ?03/22/22 1500  ?Location: Heel  ?Location Orientation: Right;Lateral  ?Staging: Deep Tissue Pressure Injury - Purple or maroon localized area of discolored intact skin or blood-filled blister due to damage of underlying soft tissue from pressure and/or shear.  ?Wound Description (Comments):   ?Present on Admission: Yes  ? ? ?Physical Exam: ?Vital Signs ?Blood pressure (!) 105/54, pulse 86, temperature 97.9 ?F (36.6 ?C), temperature source Oral, resp. rate 17, weight 77.9 kg, SpO2 93 %. ? ? ?General: No acute distress ?Mood and affect are appropriate ?Heart: Regular rate and rhythm no rubs murmurs or extra sounds ?Lungs: Clear to auscultation, breathing unlabored, no rales or wheezes ?Abdomen: Positive bowel sounds, soft nontender to palpation, nondistended ?Extremities: No clubbing, cyanosis, or edema ? ?Musculoskeletal: Full range of motion in all 4 extremities. No joint swelling ? ?Skin: +PEG in LUQ, without tenderness or drainage, but some dried TF around site with redness at 11 o'clock position of stoma, but without skin  breakdown. ?Neuro: Alert.  Motor strength 4/5 LUE/LLE, 3/5 RUE/RLE ?Musculoskeletal: FROM all extremities, without joint swelling. ?Psych: Mood and affect appropriate. ? ? ?Assessment/Plan: ?1. Functional deficits which require 3+ hours per day of interdisciplinary therapy in a comprehensive inpatient rehab setting. ?Physiatrist is providing close team supervision and 24 hour management of active medical problems listed below. ?Physiatrist and rehab team continue to assess barriers to discharge/monitor patient progress toward functional and medical goals ? ?Care Tool: ? ?Bathing ?   ?Body parts bathed by patient: Face  ? Body parts bathed by helper: Right arm, Left arm, Chest, Abdomen, Front perineal area, Buttocks, Right upper leg, Left upper leg, Right lower leg, Left lower leg ?  ?  ?Bathing assist Assist Level: Total Assistance - Patient < 25% ?  ?  ?Upper Body Dressing/Undressing ?Upper body dressing   ?What is the patient wearing?: Pull over shirt ?   ?Upper body assist Assist Level: Minimal Assistance - Patient > 75% ?   ?Lower Body Dressing/Undressing ?Lower body dressing ? ? ?   ?What is the patient wearing?: Pants, Incontinence brief ? ?  ? ?Lower body assist Assist for lower body dressing: Moderate Assistance - Patient 50 - 74% ?   ? ?Toileting ?Toileting    ?Toileting assist  Assist for toileting: Total Assistance - Patient < 25% ?  ?  ?Transfers ?Chair/bed transfer ? ?Transfers assist ?   ? ?Chair/bed transfer assist level: Minimal Assistance - Patient > 75% ?  ?  ?Locomotion ?Ambulation ? ? ?Ambulation assist ? ?   ? ?Assist level: Minimal Assistance - Patient > 75% (MinA +2) ?Assistive device: Walker-rolling ?Max distance: 10  ? ?Walk 10 feet activity ? ? ?Assist ?   ? ?Assist level: Minimal Assistance - Patient > 75% (MinA +2) ?Assistive device: Walker-rolling  ? ?Walk 50 feet activity ? ? ?Assist Walk 50 feet with 2 turns activity did not occur: Safety/medical concerns ? ?  ?   ? ? ?Walk 150 feet  activity ? ? ?Assist Walk 150 feet activity did not occur: Safety/medical concerns ? ?  ?  ?  ? ?Walk 10 feet on uneven surface  ?activity ? ? ?Assist Walk 10 feet on uneven surfaces activity did not occur: Safety/medical concerns ? ? ?  ?   ? ?Wheelchair ? ? ? ? ?Assist Is the patient using a wheelchair?: Yes ?Type of Wheelchair: Manual (per PT note, TIS w/c) ?  ? ?Wheelchair assist level: Dependent - Patient 0% ?Max wheelchair distance: 150  ? ? ?Wheelchair 50 feet with 2 turns activity ? ? ? ?Assist ? ?  ?  ? ? ?Assist Level: Dependent - Patient 0%  ? ?Wheelchair 150 feet activity  ? ? ? ?Assist ?   ? ? ?Assist Level: Dependent - Patient 0%  ? ?Blood pressure (!) 105/54, pulse 86, temperature 97.9 ?F (36.6 ?C), temperature source Oral, resp. rate 17, weight 77.9 kg, SpO2 93 %. ? ?Medical Problem List and Plan: ?1. Functional deficits secondary to left MCA stroke with right-sided weakness, slurred speech and expressive aphasia on admission ? Continue CIR PT, OT. SLP, plan d/c 4/28 ? ? ?2.  Antithrombotics: ?-DVT/anticoagulation:  Pharmaceutical: Lovenox stopped due to hematuria, use SCDs  ?-4/22 Pt tolerating SCD's well, hematuria resolved. ?            -antiplatelet therapy: Plavix and aspirin ?3. Pain Management: Tylenol prn ?4. Mood: LCSW to evaluate and provide emotional support ?            -antipsychotic agents: Seroquel 25 mg q HS ?            --Ativan prn agitation; continue restraints for now ?5. Neuropsych: This patient is not capable of making decisions on his own behalf. ?6. Skin/Wound Care: Routine skin care checks ?            --known pressure injury versus bruise>>? Left buttock ?            --dry, ischemic ulcers bilateral toes ?7. Fluids/Electrolytes/Nutrition: Routine Is and Os ?            -4/22 dysphagia improved off TF bolus, taking 100% meals by mouth  ?-4/23 Continues to take 100 % meals po.  Fluid intake 765ml 4/23 po route- improving  ?8: Left ICA occlusion s/p embolectomy by IR on 3/15 ?             --on Plavix and aspirin ?9: CHF: "eventually resume metoprolol 12.5 mg BID", does not appear to be in fluid overload  ?10: Hypertension: continue TID monitoring ? ?  04/11/2022  ?  8:42 AM 04/11/2022  ?  4:42 AM 04/11/2022  ?  3:34 AM  ?Vitals with BMI  ?Systolic  409   ?Diastolic  54   ?Pulse  86 71 74  ? Low normal BP off BP meds  ?11: Hyperlipidemia: continue Lipitor  40 mg daily ?12: Seizure-type activity: continue Keppra 500 mg BID ?13: Alcohol abuse: provide cessation counseling- ETOH neg on admission  ?            --continue thiamine supplementation ?14: Trach/Aspiration pneumonia and multiple intubation/extubation for airway protection ?            --s/p tracheostomy 3/22> trach capping per CCM ?            --now with cuffless Shiley #6 with humidified air - using PMV ?4/22 Trach capping per PCCM, so far pt is tolerating well. ?-4/23 Continues to do well with capping trial.  Continues to get scheduled nebs breathing treatments. 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            -stable, PCCM following  ?17: Tobacco abuse: continue nicotine patch ?            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 PET CT examination is recommended ?for further evaluation. Associated left hilar pathologic adenopathy. ?Will need to do as OP, will f/u pulmonary  ?20: Transaminitis:  ? ALT mildly elevated on 4/5 ?21. Left MCA stroke 08/2021 with petechial hemorrhage on aspirin monotherapy ?22. Loose stools- due to Colace? Suggest bulking up  stools at this point.  ?23. Leukocytosis post PNA- resolved 4/20 ? ?  Latest Ref Rng & Units 04/05/2022  ?  5:31 AM 04/04/2022  ?  5:28 AM 04/03/2022  ?  5:02 AM  ?CBC  ?WBC 4.0 - 10.5 K/uL 9.2   8.2   9.9    ?Hemoglobin 13

## 2022-04-11 NOTE — Progress Notes (Signed)
Occupational Therapy Session Note ? ?Patient Details  ?Name: MACAI SISNEROS ?MRN: [id number] ?Date of Birth: November 01, 1961 ? ?Today's Date: 04/11/2022 ?OT Individual Time: 1040-1105 ?OT Individual Time Calculation (min): 25 min  ? ? ?Short Term Goals: ?Week 3:  OT Short Term Goal 1 (Week 3): STG = LTG 2/2 ELOS ? ? ?Skilled Therapeutic Interventions/Progress Updates:  ?  Pt received supine with no c/o pain. Pt with trach capped and sating at 90% SpO2. He transferred to EOB with (S). Good adherence to cueing and reduced impulsivity overall. He stood from EOB with CGA and required tactile cues to wash per areas appropriately, but was able to do with CGA. He required mod cueing for sequencing through bathing in standing and donning of new brief. He transferred to the TIS w/c with min HHA. He completed oral care at the sink with no swallowing of thin liquids. He completed BUE hand hygiene with mod facilitation to bring his RUE to the sink for bimanual coordination/awareness.  Pt was left sitting up in the TIS with all needs met, SLP entering room. All VSS with trach capped throughout session.   ? ? ?Therapy Documentation ?Precautions:  ?Precautions ?Precautions: Fall ?Precaution Comments: Bil mitts, R inattention, trach, wean O2 >/=92%, abdominal binder all the time, TEDs only on during therapy then removed ?Restrictions ?Weight Bearing Restrictions: No ? ? ? ?Therapy/Group: Individual Therapy ? ?Curtis Sites ?04/11/2022, 6:35 AM ?

## 2022-04-11 NOTE — Progress Notes (Signed)
Physical Therapy Session Note ? ?Patient Details  ?Name: Todd Estrada ?MRN: [id number] ?Date of Birth: 10-Dec-1961 ? ?Today's Date: 04/11/2022 ?PT Individual Time: 7824-2353 ?PT Individual Time Calculation (min): 59 min  ? ?Short Term Goals: ?Week 1:  PT Short Term Goal 1 (Week 1): pt will trasnfer to [facility] with min assist ?PT Short Term Goal 1 - Progress (Week 1): Met ?PT Short Term Goal 2 (Week 1): Pt will consistently perform bed mobility with min assist ?PT Short Term Goal 2 - Progress (Week 1): Met ?PT Short Term Goal 3 (Week 1): Pt will ambulate 59f with mod A +2 ?PT Short Term Goal 3 - Progress (Week 1): Met ?Week 2:  PT Short Term Goal 1 (Week 2): Pt will ambulate 314fwith mod assist ?PT Short Term Goal 1 - Progress (Week 2): Met ?PT Short Term Goal 2 (Week 2): Pt will perform TUG ?PT Short Term Goal 2 - Progress (Week 2): Met ?PT Short Term Goal 3 (Week 2): Pt will tolerate standing up to 2 minutes with min assist to improve ability to participate in ADLs ?PT Short Term Goal 3 - Progress (Week 2): Met ?Week 3:  PT Short Term Goal 1 (Week 3): STG=LTG due to ELOS ? ?Skilled Therapeutic Interventions/Progress Updates:  ?Patient supine in bed on entrance to room. Patient alert and agreeable to PT session. Mitts doffed and abdominal binder donned MaxA.  ? ?Patient with no pain complaint throughout session. ? ?Therapeutic Activity: ?Bed Mobility: Patient performed supine --> sit with CGA/ supervision. VC/ tc required for initiation. ?Transfers: Patient performed sit<>stand transfers throughout session using RW with CGA and stand pivot transfers throughout session with MinA for walker mgmt and impulsivity. Provided verbal cues for reaching back to seat prior to descent. Impulsive in rising to stand but is able to reach standing balance to no UE support.  ? ?Gait Training:  ?Patient ambulated bed to bathroom and bathroom to w/c placed in hallway just outside of door. Uses RW with R hand splint. Demonstrated impulsive  stepping and steps R foot into back leg of walker despite vc. TC and MinA for improved placement of walker to clear L foot. Provided vc/ tc for upright posture, slow pace, level gaze, and stepping into center of walker.. ? ?Neuromuscular Re-ed: ?NMR facilitated during session with focus on standing balance and cognition. Pt guided in reach to number targets on wall 1-6. Is able to locate in correct order and with called number to pt. Can state numbers 2, 3, 4 but unable to state 1, 5, 6. No LOB in reach. Sits during reaching bout for fatigue. Is able to keep up with touching of numbers with increased call to 3 numbers in a row and can keep up with speed of call. NMR performed for improvements in motor control and coordination, balance, sequencing, judgement, and self confidence/ efficacy in performing all aspects of mobility at highest level of independence.  ? ?Patient supine  in bed at end of session with brakes locked, bed alarm set, and all needs within reach. ? ?Therapy Documentation ?Precautions:  ?Precautions ?Precautions: Fall ?Precaution Comments: Bil mitts, R inattention, trach, wean O2 >/=92%, abdominal binder all the time, TEDs only on during therapy then removed ?Restrictions ?Weight Bearing Restrictions: No ?General: ?  ?Vital Signs: ?Therapy Vitals ?Pulse Rate: 92 ?Resp: 16 ?Patient Position (if appropriate): Lying ?Oxygen Therapy ?SpO2: 90 % ?O2 Device: Room Air ?Pain: ? No pain complaint this session.  ? ?Therapy/Group: Individual Therapy ? ?JuAlmyra Free  Christoper Allegra PT, DPT ?04/11/2022, 6:11 PM  ?

## 2022-04-11 NOTE — Discharge Instructions (Addendum)
Inpatient Rehab Discharge Instructions ? ?[name] ?Discharge date and time: 04/15/2022 ? ?Activities/Precautions/ Functional Status: ?Activity: no lifting, driving, or strenuous exercise for until cleared by MD ?Diet: fine chopped solids, thin liquids with supervision. Heart healthy ?Wound Care: none needed ?Functional status:  ?___ No restrictions     ___ Walk up steps independently ?___ 24/7 supervision/assistance   ___ Walk up steps with assistance ?_x__ Intermittent supervision/assistance  ___ Bathe/dress independently ?___ Walk with walker     ___ Bathe/dress with assistance ?___ Walk Independently    ___ Shower independently ?___ Walk with assistance    _x__ Shower with assistance ?__x_ No alcohol     ___ Return to work/school ________ ? ?COMMUNITY REFERRALS UPON DISCHARGE:   ? ?Home Health:   PT     OT     ST                   Agency: [facility]  Phone: [phone]  ? ?Medical Equipment/Items Ordered: Conservation officer, nature, 18x18 Wheelchair, Suction, Producer, television/film/video ?                                                Agency/Supplier: [employer] [phone] ? ? ? ?Special Instructions: ? ?No driving, alcohol consumption or tobacco use.  ? ?STROKE/TIA DISCHARGE INSTRUCTIONS ?SMOKING Cigarette smoking nearly doubles your risk of having a stroke & is the single most alterable risk factor  ?If you smoke or have smoked in the last 12 months, you are advised to quit smoking for your health. Most of the excess cardiovascular risk related to smoking disappears within a year of stopping. ?Ask you doctor about anti-smoking medications ?[place] Quit Line: 1-800-QUIT NOW ?Free Smoking Cessation Classes [phone]  ?CHOLESTEROL Know your levels; limit fat & cholesterol in your diet  ?Lipid Panel  ?   ?Component Value Date/Time  ? CHOL 115 03/03/2022 0430  ? TRIG 69 03/12/2022 0559  ? HDL 34 (L) 03/03/2022 0430  ? CHOLHDL 3.4 03/03/2022 0430  ? VLDL 21 03/03/2022 0430  ? [place] 60 03/03/2022 0430  ? ? ? Many patients benefit from  treatment even if their cholesterol is at goal. ?Goal: Total Cholesterol (CHOL) less than 160 ?Goal:  Triglycerides (TRIG) less than 150 ?Goal:  HDL greater than 40 ?Goal:  LDL (LDLCALC) less than 100 ?  ?BLOOD PRESSURE American Stroke Association blood pressure target is less that 120/80 mm/Hg  ?Your discharge blood pressure is:  BP: (!) 105/54 Monitor your blood pressure ?Limit your salt and alcohol intake ?Many individuals will require more than one medication for high blood pressure  ?DIABETES (A1c is a blood sugar average for last 3 months) Goal HGBA1c is under 7% (HBGA1c is blood sugar average for last 3 months)  ?Diabetes: ?No known diagnosis of diabetes   ? ?Lab Results  ?Component Value Date  ? HGBA1C 5.4 03/03/2022  ? ? Your HGBA1c can be lowered with medications, healthy diet, and exercise. ?Check your blood sugar as directed by your physician ?Call your physician if you experience unexplained or low blood sugars.  ?PHYSICAL ACTIVITY/REHABILITATION Goal is 30 minutes at least 4 days per week  ?Activity: Increase activity slowly, ?Therapies: Physical Therapy: Home Health ?Return to work: n/a Activity decreases your risk of heart attack and stroke and makes your heart stronger.  It helps control your weight and blood pressure;  helps you relax and can improve your mood. ?Participate in a regular exercise program. ?Talk with your doctor about the best form of exercise for you (dancing, walking, swimming, cycling).  ?DIET/WEIGHT Goal is to maintain a healthy weight  ?Your discharge diet is:  ?Diet Order   ? ?       ?  DIET DYS 2 Room service appropriate? Yes; Fluid consistency: Thin  Diet effective now       ?  ? ?  ?  ? ?  ? thin liquids ?Your height is:   6' ?Your current weight is: Weight: 77.9 kg ?Your Body Mass Index (BMI) is:  BMI (Calculated): 23.29 Following the type of diet specifically designed for you will help prevent another stroke. ?Your goal weight range is:   ?Your goal Body Mass Index (BMI) is  19-24. ?Healthy food habits can help reduce 3 risk factors for stroke:  High cholesterol, hypertension, and excess weight.  ?RESOURCES Stroke/Support Group:  Call [phone] ?  ?STROKE EDUCATION PROVIDED/REVIEWED AND GIVEN TO PATIENT Stroke warning signs and symptoms ?How to activate emergency medical system (call 911). ?Medications prescribed at discharge. ?Need for follow-up after discharge. ?Personal risk factors for stroke. ?Pneumonia vaccine given: No ?Flu vaccine given: No ?My questions have been answered, the writing is legible, and I understand these instructions.  I will adhere to these goals & educational materials that have been provided to me after my discharge from the hospital.  ? ?  ?My questions have been answered and I understand these instructions. I will adhere to these goals and the provided educational materials after my discharge from the hospital. ? ?Patient/Caregiver Signature _______________________________ Date __________ ? ?Clinician Signature _______________________________________ Date __________ ? ?Please bring this form and your medication list with you to all your follow-up doctor's appointments.   ?

## 2022-04-11 NOTE — Progress Notes (Signed)
? ?NAME:  Todd Estrada, MRN:  [id number], DOB:  [birth year], LOS: 74 ?ADMISSION DATE:  03/22/2022, CONSULTATION DATE:  3/16 ?REFERRING MD:  Erlinda Hong, CHIEF COMPLAINT:  Confusion, seizure  ? ?History of Present Illness:  ?61 y/o male with a history of Left MCA stroke in 8127 with a complicated post stroke course presented on 3/15 with a 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  Intubated on 3/16 for airway protection.  Transferred to Rehab 03/22/2022. ? ?Pertinent  Medical History  ?Hyeprtension ?Left MCA stroke ?Chronic combined CHF ? ?Significant Hospital Events: ?Including procedures, antibiotic start and stop dates in addition to other pertinent events   ?3/15 admitted for acute L MCA stroke due to L ICA occlusion with first time seizure s/p revascularization of L ICA occlusion ?3/16 Intubated for airway protection; started unasyn; echo LVEF 40-45%, RVSP normal ?3/17 MRI Brain showed acute infarct in left frontal, temporal and parietal lobes with findings worrisome for acute infarct, left vert occluded; CT head did not show hemorrhage; respiratory notes thick secretions,  ?3/18 following some commands on exam, extubated the re-intubated due to hypoxemia, inability to clear secretions ?3/19 self extubated overnight, remains on NRB ?3/21 required reintubation for inability to protect airway and aspiration  ?3/22 bedside perc tracheostomy with bronchoscopy  ?3/24 PEG ?3/26 started on trach collar ?3/30 pulmonary critical care called to bedside for patient who has decannulate himself x2.  He is now restrained and most likely needs a sitter ?4/4 transferred to rehab ?4/5 pulmonary critical care consulted ?4/10 Pulmonary Critical care asked to assess for increased oxygen needs ?4/17 Awake, ATC 28%, #6 trach. No complaints.  Tried to drink water from sterile bottle of water on bedside tray, tolerating PMV ? ?Interim History / Subjective:  ?Afebrile  ?Tolerated capping trial ? ?Objective   ?Blood pressure  (!) 105/54, pulse (!) 106, temperature 97.9 ?F (36.6 ?C), temperature source Oral, resp. rate 18, weight 77.9 kg, SpO2 92 %. ?   ?   ? ?Intake/Output Summary (Last 24 hours) at 04/11/2022 1218 ?Last data filed at 04/11/2022 5170 ?Gross per 24 hour  ?Intake 715 ml  ?Output 802 ml  ?Net -87 ml  ? ?Filed Weights  ? 03/26/22 0501 03/27/22 0500 03/28/22 0512  ?Weight: 77 kg 78.2 kg 77.9 kg  ? ? ?Exam: ?Physical exam: ?General: Chronically ill-appearing male, lying on the bed ?HEENT: [place]/AT, eyes anicteric.  moist mucus membranes ?Neuro: Alert, awake following commands, aphasic ?Chest: Coarse breath sounds, no wheezes or rhonchi ?Heart: Regular rate and rhythm, no murmurs or gallops ?Abdomen: Soft, nontender, nondistended, bowel sounds present ?Skin: No rash ? ? ?Resolved Hospital Problem list   ?Septic shock due to aspiration pneumonia > resolved ?Constipation ? ?Assessment & Plan:  ?Acute encephalopathy due to left MCA stroke from left ICA occlusion requiring thrombectomy and stent placement on 3/15 ?Seizure disorder ?Acute hypoxemic respiratory failure due to aspiration pneumonia s/p tracheostomy   ?Concern for emphysema/COPD > not in history but exam supports ?Tolerating capping trial   ?Will proceed with trach decannulation ?Continue pulmicort, brovana, yupelri ?PT/OT  ? ?PCCM will sign off, please call with questions ?  ? ?Best Practice (right click and "Reselect all SmartList Selections" daily)  ?Per primary ?Diet/type: tubefeeds ?DVT prophylaxis: LMWH ?GI prophylaxis: PPI ?Lines: N/A ?Foley:  N/A ?Code Status:  full code ?Last date of multidisciplinary goals of care discussion: per primary  ? ? ?  ? ?Jacky Kindle MD ?[facility] ?See Amion for pager ?  If no response to pager, please call [phone] until 7pm ?After 7pm, Please call E-link [phone] ? ? ? ? ? ? ?

## 2022-04-11 NOTE — Progress Notes (Signed)
Patient ID: Todd Estrada, male   DOB: Feb 11, 1961, 61 y.o.   MRN: [id number] ? ?Suction kit ordered through [employer]  ?

## 2022-04-11 NOTE — Progress Notes (Signed)
Speech Language Pathology Daily Session Note ? ?Patient Details  ?Name: Todd Estrada ?MRN: [id number] ?Date of Birth: [birth year] ? ?Today's Date: 04/11/2022 ?SLP Individual Time: 1281-1886 ?SLP Individual Time Calculation (min): 45 min ? ?Short Term Goals: ?Week 3: SLP Short Term Goal 1 (Week 3): Patient will consume current diet with minimal overt s/sx of aspiration and min A cues for implementation of safe swallowing precautions and strategies ?SLP Short Term Goal 2 (Week 3): Patient will consume trials of ice chips with minimal overt s/s of aspiration over 2 sessions to assess readiness for repeat MBS. ?SLP Short Term Goal 3 (Week 3): Patient will demonstrate sustained attention to functional/structured tasks for 30 minutes with Min verbal cues for redirection. ?SLP Short Term Goal 4 (Week 3): Patient will respond to basic yes/no questions in regards to biographical and enviornmental information with min A verbal/visual cues to achieve 75% accuracy ?SLP Short Term Goal 5 (Week 3): Pt will communicate basic wants/needs via multimodal communication and min A cues ? ?Skilled Therapeutic Interventions: Skilled ST treatment focused on swallowing and communication goals. Pt received upright in wheelchair having just completed OT session. Trach capped and vocal quality perceived as clear and strong. SPO2 ~92-95% throughout tx. Respiratory therapist in and out, reported plans to remove trach today; will return when pt is not in therapy. Pt agreeable to perform oral care using suction swab and min A for thoroughness. Pt was able to orally expel secretions following verbal cue however suspect only able to partially expel due to small amount observed. SLP suctioned remaining secretions. Recommend suction kit at discharge. Notified SW. Patient consumed single ice chips x3, 1/2 tsp of water x4, 5cc by medicine cup x3, 7.5cc x2, and 10cc x1 with what appeared to be mildly delayed and disorganized oral transit, piecemeal swallow  with larger boluses, and without overt s/s of aspiration among all tested consistencies. Pt requested to discontinue trials through gesture and shaking head to essentially communicate "no more". Recommend MBS to further evaluate oropharyngeal swallow this week. Pt agreeable to language tasks. SLP facilitated automatic speech tasks with max A multimodal cues. Pt approximated "one", "four", "five", "eight", and "ten." Pt unwilling to participate in further language treatment through consistently shaking head and gesturing "no." Pt nodded "yes" when asked if he was too tired to participate further. Pt missed 15 minutes of ST intervention due to refusal/fatigue. Patient was left in wheelchair with soft mittens in place, alarm activated, and immediate needs within reach at end of session. Continue per current plan of care.   ?   ?Pain ?Pain Assessment ?Pain Scale: 0-10 ?Pain Score: 0-No pain ? ?Therapy/Group: Individual Therapy ? ?Todd Estrada ?04/11/2022, 11:29 AM ?

## 2022-04-12 NOTE — Progress Notes (Signed)
Reviewed TS notes though the 22nd, no interaction from 04/22-04/25. Discussed with nurse about d/c camera, no reports noted of pt every being impulsive except when TS first ordered. D/c TS order.   ?

## 2022-04-12 NOTE — Progress Notes (Signed)
Speech Language Pathology Daily Session Note ? ?Patient Details  ?Name: Todd Estrada ?MRN: [id number] ?Date of Birth: 04-15-1961 ? ?Today's Date: 04/12/2022 ?SLP Individual Time: 1300-1400 ?SLP Individual Time Calculation (min): 60 min ? ?Short Term Goals: ?Week 3: SLP Short Term Goal 1 (Week 3): Patient will consume current diet with minimal overt s/sx of aspiration and min A cues for implementation of safe swallowing precautions and strategies ?SLP Short Term Goal 2 (Week 3): Patient will consume trials of ice chips with minimal overt s/s of aspiration over 2 sessions to assess readiness for repeat MBS. ?SLP Short Term Goal 3 (Week 3): Patient will demonstrate sustained attention to functional/structured tasks for 30 minutes with Min verbal cues for redirection. ?SLP Short Term Goal 4 (Week 3): Patient will respond to basic yes/no questions in regards to biographical and enviornmental information with min A verbal/visual cues to achieve 75% accuracy ?SLP Short Term Goal 5 (Week 3): Pt will communicate basic wants/needs via multimodal communication and min A cues ? ?Skilled Therapeutic Interventions: Skilled ST treatment focused on language goals. Pt initially declined swallowing therapy to include therapeutic PO trials. MBS scheduled tomorrow to further evaluate. SLP educated pt regarding study and pt nodded head in agreement and verbalized "yeah" when asked "are you agreeable to the study tomorrow?" SLP facilitated session by providing sup A cues for orientation to day of week and month. This information was identified through pt pointing to field of 3 written choices. Pt responded to biographical and environmental yes/no questions with overall 76% accuracy and sup-to-min A verbal cues for repetition. SLP attempted to utilize alphabet board for patient to spell his cousin's name, [name]. Pt was able to point to first and second letter given reduced written field of 3, however unable to complete name in it's  entirety. SLP facilitated verbal counting with max A to achieve overall 60% accuracy through approximations and some clear speech; inconsistencies noted with each attempt x4. SLP facilitated functional word repetition with max A verbal/visual/imitation cues to achieve 15% accuracy. Patient successfully verbalized "warm" and "water"; approximated "hello", "Shanon Brow", and "bed." At end of session SLP received secure Epic chat to inform SLP of increased coughing with intake today and less consumption as compared to previous meals. SLP informed patient of noted concerns and pt was in agreement to consume nectar thick liquids; declined solids due to fatigue. Pt consumed cup sips of nectar thick liquids without overt s/sx of aspiration and clear vocal quality post swallows. Pt required minA  verbal cues to take small, single sips; pt with minimal modification to bolus size despite cues, however this did not appear to result in s/s of aspiration. SLP recommends to continue current diet and MBS to further evaluate scheduled tomorrow. Notified nurse and MD. Patient was left in bed with alarm activated and immediate needs within reach at end of session. Continue per current plan of care.   ?   ? ?Pain ?Pain Assessment ?Pain Scale: 0-10 ?Pain Score: 0-No pain ? ?Therapy/Group: Individual Therapy ? ?Joeline Freer T Dillon Mcreynolds ?04/12/2022, 1:13 PM ?

## 2022-04-12 NOTE — Progress Notes (Signed)
Occupational Therapy Session Note ? ?Patient Details  ?Name: ALRIC GEISE ?MRN: [id number] ?Date of Birth: [birth year] ? ?Today's Date: 04/12/2022 ?OT Individual Time: 0904-1000 ?OT Individual Time Calculation (min): 56 min  ? ? ?Short Term Goals: ?Week 1:  OT Short Term Goal 1 (Week 1): Pt will complete 2 grooming tasks at sink with mod A. ?OT Short Term Goal 1 - Progress (Week 1): Not met ?OT Short Term Goal 2 (Week 1): Pt will complete sit to stand at sink with mod A. ?OT Short Term Goal 2 - Progress (Week 1): Not met ?OT Short Term Goal 3 (Week 1): Pt will don shirt with max A. ?OT Short Term Goal 3 - Progress (Week 1): Not met ?OT Short Term Goal 4 (Week 1): Pt will transfer to toilet with max A and LRAD. ?OT Short Term Goal 4 - Progress (Week 1): Not met ?Week 2:  OT Short Term Goal 1 (Week 2): Pt will complete 2 grooming tasks at sink with mod A. ?OT Short Term Goal 2 (Week 2): Pt will complete sit to stand at sink with mod A. ?OT Short Term Goal 2 - Progress (Week 2): Met ?OT Short Term Goal 3 (Week 2): Pt will don shirt with max A. ?OT Short Term Goal 3 - Progress (Week 2): Met ?OT Short Term Goal 4 (Week 2): Pt will transfer to toilet with max A and LRAD. ?OT Short Term Goal 4 - Progress (Week 2): Met ?OT Short Term Goal 5 (Week 2): Pt will participate in 30 min of OT session in 2/3 attemps. ?OT Short Term Goal 5 - Progress (Week 2): Met ?Week 3:  OT Short Term Goal 1 (Week 3): STG = LTG 2/2 ELOS ? ?Skilled Therapeutic Interventions/Progress Updates:  ?   ?Pt received semi-reclined in bed, no s/sx pain and, agreeable to therapy. Session focus on self-care retraining, activity tolerance, transfer retraining in prep for improved ADL/IADL/func mobility performance + decreased caregiver burden. Came to sitting EOB with close S. Total A to don B and later doff teds and gripper socks. Min A to thread RUE into new shirt, mod A to thread RLE and pull up pants over R hip. Ambulated to and from toilet with min B HHA  from therapist and rehab tech. Max A to manage clothing, pt initially attempting to sit prior to pulling down pants. No void. Brushed hair seated at sink with S. Total A TIS transport to and from gym. ? ? ?Pt stood for 3x1 min to participate in bean bag toss. Denies dizziness, but required seated rest break in between bouts. Attempted volley ball toss in standing with use of dowel rod and ace wrap to R grip to target static standing balance, activity tolerance, and bimanual integration, but pt unable to return demonstration correct method despite multimodal cuing. Pt indicated that he would like to get back in TIS and rest. ? ? ?Pt left tilted back in TIS with safety mitts donned and telesitter on with safety belt placed and chair pad alarm engaged, call bell in reach, and all immediate needs met.  ? ?Therapy Documentation ?Precautions:  ?Precautions ?Precautions: Fall ?Precaution Comments: Bil mitts, abdominal binder all the time, TEDs only on during therapy then removed ?Restrictions ?Weight Bearing Restrictions: No ? ?Pain: ? No s/sx throughout ?ADL: See Care Tool for more details. ? ?Therapy/Group: Individual Therapy ? ?Volanda Napoleon MS, OTR/L ? ?04/12/2022, 6:50 AM ?

## 2022-04-12 NOTE — Progress Notes (Signed)
Initial Nutrition Assessment ? ?DOCUMENTATION CODES:  ? ?Non-severe (moderate) malnutrition in context of chronic illness ? ?INTERVENTION:  ? ?Multivitamin w/ minerals daily ?Discontinue Ensure Enlive ?Vital Cuisine Shake BID, each supplement provides 520 kcal and 22 grams of protein ? ?NUTRITION DIAGNOSIS:  ? ?Moderate Malnutrition related to chronic illness (stroke) as evidenced by moderate fat depletion, moderate muscle depletion. - Ongoing ? ?GOAL:  ? ?Patient will meet greater than or equal to 90% of their needs - Progressing  ? ?MONITOR:  ? ?PO intake, Supplement acceptance, Skin ? ?REASON FOR ASSESSMENT:  ? ?Consult ?Enteral/tube feeding initiation and management ? ?ASSESSMENT:  ? ?61 year old male with history of previous L MCA stroke presents after witnessed seizure-like activity at home. Slurred speech and right-sided weakness noted. CTA concerning for occlusion of left ICA. Pt underwent embolectomy and balloon angioplasty on 3/15. MRI 3/17 with findings worrisome for acute left MCA territory infarcts. Percutaneous tracheostomy on 3/22.  PEG placement performed on 3/24. 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 ? ?4/24 - decannulated  ? ?Pt reports that his appetite is ok and that he is eating ~50% of his meals. Reports that he is drinking the Ensure shakes and he likes them.  ?Per EMR, pt PO intake includes: ?4/22: Lunch 100%, Dinner 100% ?4/23: Breakfast 100%, Lunch 75%, Dinner 50% ?4/24: Breakfast 100%, Lunch 100%, Dinner 90% ? ?Medications reviewed and include: Colace, Folic Acid, MVI, Thiamine, Protonix ?Labs reviewed.  ? ?Diet Order:   ?Diet Order   ? ?       ?  DIET DYS 2 Room service appropriate? Yes; Fluid consistency: Nectar Thick  Diet effective now       ?  ? ?  ?  ? ?  ? ? ?EDUCATION NEEDS:  ? ?No education needs have been identified at this time ? ?Skin:  Skin Assessment: Reviewed RN  Assessment ?Skin Integrity Issues:: DTI, Other (Comment) ?DTI: R heel ?Other: R, L black 2nd toe ? ?Last BM:  4/23 ? ?Height:  ?Ht Readings from Last 1 Encounters:  ?03/05/22 6' (1.829 m)  ? ?Weight: ?Wt Readings from Last 1 Encounters:  ?03/28/22 77.9 kg  ? ?BMI:  Body mass index is 23.29 kg/m?. ? ?Estimated Nutritional Needs:  ? ?Kcal:  2100-2300 ? ?Protein:  105-120 grams ? ?Fluid:  >/= 2 L/day ? ? ? ?Hermina Barters RD, LDN ?Clinical Dietitian ?See AMiON for contact information.  ? ?

## 2022-04-12 NOTE — Progress Notes (Signed)
?                                                       PROGRESS NOTE ? ? ?Subjective/Complaints: ? ?Trach removed no issues overnite  ? ?ROS: Unable to obtain due to aphasia/dysarthria. ? ?Objective: ?  ?No results found. ?No results for input(s): WBC, HGB, HCT, PLT in the last 72 hours. ? ?Recent Labs  ?  04/11/22 ?0634  ?NA 137  ?K 4.2  ?CL 100  ?CO2 30  ?GLUCOSE 102*  ?BUN 20  ?CREATININE 0.90  ?CALCIUM 9.3  ? ? ? ? ?Intake/Output Summary (Last 24 hours) at 04/12/2022 0850 ?Last data filed at 04/12/2022 0500 ?Gross per 24 hour  ?Intake 413 ml  ?Output 300 ml  ?Net 113 ml  ? ?  ? ?Pressure Injury 03/22/22 Heel Right;Lateral Deep Tissue Pressure Injury - Purple or maroon localized area of discolored intact skin or blood-filled blister due to damage of underlying soft tissue from pressure and/or shear. (Active)  ?03/22/22 1500  ?Location: Heel  ?Location Orientation: Right;Lateral  ?Staging: Deep Tissue Pressure Injury - Purple or maroon localized area of discolored intact skin or blood-filled blister due to damage of underlying soft tissue from pressure and/or shear.  ?Wound Description (Comments):   ?Present on Admission: Yes  ? ? ?Physical Exam: ?Vital Signs ?Blood pressure 105/65, pulse 81, temperature 98.5 ?F (36.9 ?C), resp. rate 14, weight 77.9 kg, SpO2 92 %. ? ?Trach site with bandage no audible airleak  ?General: No acute distress ?Mood and affect are appropriate ?Heart: Regular rate and rhythm no rubs murmurs or extra sounds ?Lungs: Clear to auscultation, breathing unlabored, no rales or wheezes ?Abdomen: Positive bowel sounds, soft nontender to palpation, nondistended ?Extremities: No clubbing, cyanosis, or edema ? ?Musculoskeletal: Full range of motion in all 4 extremities. No joint swelling ? ?Skin: +PEG in LUQ, without tenderness or drainage, but some dried TF around site with redness at 11 o'clock position of stoma, but without skin breakdown. ?Neuro: Alert.  Motor strength 4/5 LUE/LLE, 3/5  RUE/RLE ?Musculoskeletal: FROM all extremities, without joint swelling. ?Psych: Mood and affect appropriate. ? ? ?Assessment/Plan: ?1. Functional deficits which require 3+ hours per day of interdisciplinary therapy in a comprehensive inpatient rehab setting. ?Physiatrist is providing close team supervision and 24 hour management of active medical problems listed below. ?Physiatrist and rehab team continue to assess barriers to discharge/monitor patient progress toward functional and medical goals ? ?Care Tool: ? ?Bathing ?   ?Body parts bathed by patient: Face  ? Body parts bathed by helper: Right arm, Left arm, Chest, Abdomen, Front perineal area, Buttocks, Right upper leg, Left upper leg, Right lower leg, Left lower leg ?  ?  ?Bathing assist Assist Level: Total Assistance - Patient < 25% ?  ?  ?Upper Body Dressing/Undressing ?Upper body dressing   ?What is the patient wearing?: Pull over shirt ?   ?Upper body assist Assist Level: Minimal Assistance - Patient > 75% ?   ?Lower Body Dressing/Undressing ?Lower body dressing ? ? ?   ?What is the patient wearing?: Pants, Incontinence brief ? ?  ? ?Lower body assist Assist for lower body dressing: Moderate Assistance - Patient 50 - 74% ?   ? ?Toileting ?Toileting    ?Toileting assist Assist for toileting: Total Assistance - Patient < 25% ?  ?  ?  Transfers ?Chair/bed transfer ? ?Transfers assist ?   ? ?Chair/bed transfer assist level: Minimal Assistance - Patient > 75% ?  ?  ?Locomotion ?Ambulation ? ? ?Ambulation assist ? ?   ? ?Assist level: Minimal Assistance - Patient > 75% (MinA +2) ?Assistive device: Walker-rolling ?Max distance: 10  ? ?Walk 10 feet activity ? ? ?Assist ?   ? ?Assist level: Minimal Assistance - Patient > 75% (MinA +2) ?Assistive device: Walker-rolling  ? ?Walk 50 feet activity ? ? ?Assist Walk 50 feet with 2 turns activity did not occur: Safety/medical concerns ? ?  ?   ? ? ?Walk 150 feet activity ? ? ?Assist Walk 150 feet activity did not occur:  Safety/medical concerns ? ?  ?  ?  ? ?Walk 10 feet on uneven surface  ?activity ? ? ?Assist Walk 10 feet on uneven surfaces activity did not occur: Safety/medical concerns ? ? ?  ?   ? ?Wheelchair ? ? ? ? ?Assist Is the patient using a wheelchair?: Yes ?Type of Wheelchair: Manual (per PT note, TIS w/c) ?  ? ?Wheelchair assist level: Dependent - Patient 0% ?Max wheelchair distance: 150  ? ? ?Wheelchair 50 feet with 2 turns activity ? ? ? ?Assist ? ?  ?  ? ? ?Assist Level: Dependent - Patient 0%  ? ?Wheelchair 150 feet activity  ? ? ? ?Assist ?   ? ? ?Assist Level: Dependent - Patient 0%  ? ?Blood pressure 105/65, pulse 81, temperature 98.5 ?F (36.9 ?C), resp. rate 14, weight 77.9 kg, SpO2 92 %. ? ?Medical Problem List and Plan: ?1. Functional deficits secondary to left MCA stroke with right-sided weakness, slurred speech and expressive aphasia on admission ? Continue CIR PT, OT. SLP, plan d/c 4/28 ? ? ?2.  Antithrombotics: ?-DVT/anticoagulation:  Pharmaceutical: Lovenox stopped due to hematuria, use SCDs  ?-4/22 Pt tolerating SCD's well, hematuria resolved. ?            -antiplatelet therapy: Plavix and aspirin ?3. Pain Management: Tylenol prn ?4. Mood: LCSW to evaluate and provide emotional support ?            -antipsychotic agents: Seroquel 25 mg q HS ?            --Ativan prn agitation; continue restraints for now ?5. Neuropsych: This patient is not capable of making decisions on his own behalf. ?6. Skin/Wound Care: Routine skin care checks ?            --known pressure injury versus bruise>>? Left buttock ?            --dry, ischemic ulcers bilateral toes ?7. Fluids/Electrolytes/Nutrition: Routine Is and Os ?            -4/22 dysphagia improved off TF bolus, taking 100% meals by mouth  ?-4/23 Continues to take 100 % meals po.  Fluid intake 741ml 4/23 po route- improving  ?8: Left ICA occlusion s/p embolectomy by IR on 3/15 ?            --on Plavix and aspirin ?9: CHF: "eventually resume metoprolol 12.5 mg BID",  does not appear to be in fluid overload  ?10: Hypertension: continue TID monitoring ? ?  04/12/2022  ?  4:39 AM 04/11/2022  ?  7:32 PM 04/11/2022  ?  7:30 PM  ?Vitals with BMI  ?Systolic 970 98   ?Diastolic 65 65   ?Pulse 81 76 81  ? Low normal BP off BP meds  ?11: Hyperlipidemia: continue Lipitor  40 mg daily ?12: Seizure-type activity: continue Keppra 500 mg BID ?13: Alcohol abuse: provide cessation counseling- ETOH neg on admission  ?            --continue thiamine supplementation ? 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            -stable, PCCM following  ?17: Tobacco abuse: continue nicotine patch ?            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 PET CT examination is recommended ?for further evaluation. Associated left hilar pathologic adenopathy. ?Will need to do as OP, will f/u pulmonary  ?20: Transaminitis:  ? ALT mildly elevated on 4/5 ?21. Left MCA stroke 08/2021 with petechial hemorrhage on aspirin monotherapy ?22. Loose stools- due to Colace? Suggest bulking up stools at this point.  ? ? ?  Latest Ref Rng & Units 04/05/2022  ?  5:31 AM 04/04/2022  ?  5:28 AM 04/03/2022  ?  5:02 AM  ?CBC  ?WBC 4.0 - 10.5 K/uL 9.2   8.2   9.9    ?Hemoglobin 13.0 - 17.0 g/dL 14.3   13.0   13.1    ?Hematocrit 39.0 - 52.0 % 42.6   40.0   39.5    ?Platelets 150 - 400 K/uL 229   252   261    ?  ?24. PAD RIght SFA occlusion VVS eval appreciated, ABI reviewed and shows moderate to severe arterial disease no clincal signs of critical limb ischemia,  cannot express other symptoms such as claudication due to aphasia following CVA, needs OP f/u with VVS  ?25. Lethargy: multifactorial, Likely due to hypoxia and UTI  , now improved ?26. Hematuria- has stopped,  tubing has clear urine, pt likely had external trauma related to freq removal and reapplication of condom cath while receiving lovenox , plavix and ASA, will d/c lovenox ,  ? CBC WNL on 4/16 and 4/17 ,4/16 dc daily cbc

## 2022-04-12 NOTE — Plan of Care (Signed)
?  Problem: RH Expression Communication ?Goal: LTG Patient will increase speech intelligibility (SLP) ?Description: LTG: Patient will increase speech intelligibility at word/phrase/conversation level with cues, % of the time (SLP) ?Outcome: Not Applicable ?Note: Goal discontinued due to severity of deficits ?  ?

## 2022-04-12 NOTE — Progress Notes (Signed)
Patient ID: Todd Estrada, male   DOB: March 18, 1961, 61 y.o.   MRN: [id number] ? ?Transfer Bench ordered through [facility]  ?

## 2022-04-13 ENCOUNTER — Inpatient Hospital Stay (HOSPITAL_COMMUNITY): Payer: Medicaid Other

## 2022-04-13 MED ORDER — DOCUSATE SODIUM 50 MG/5ML PO LIQD
100.0000 mg | Freq: Two times a day (BID) | ORAL | Status: DC
Start: 2022-04-13 — End: 2022-04-15
  Administered 2022-04-13 – 2022-04-15 (×5): 100 mg via ORAL
  Filled 2022-04-13 (×5): qty 10

## 2022-04-13 NOTE — Progress Notes (Signed)
Physical Therapy Session Note ? ?Patient Details  ?Name: Todd Estrada ?MRN: [id number] ?Date of Birth: 06/05/1961 ? ?Today's Date: 04/13/2022 ?PT Individual Time: 2878-6767 ?PT Individual Time Calculation (min): 43 min  ? ?Short Term Goals: ? ?Week 3:  PT Short Term Goal 1 (Week 3): STG=LTG due to ELOS ? ? ?Skilled Therapeutic Interventions/Progress Updates:  ? ?Pt received supine in bed and agreeable to PT. Supine>sit transfer with supervision assist and cues for attention to the RLE  ? ?Stand pivot transfers to and from [facility] with supervision assist and cues for awareness of position of WC on the R side. .  ? ?Gait training without AD x 84f with min assist and cues for posture and step length on the RLE. .Marland Kitchen ? ?Dynamic balance training to perform lateral and cross body reach to obtain then throw bean bag to target. Performed x 10 Bil with LUE from level surface and on airex pad. Min assist for balance on airex pad. ? ?Stair management with LUE support on L rail for anterior ascent and posterior descent with min assist and cues for attention to the postiion of RLE to reduce fall risk.  ? ?Pt reports need for BM. Ambulatory transfer to bathroom with no Ad and min assist pt able to void bowel. Noted to rub hand in perineum multiple times despite cues for use of wipe. Pericare performed by PT. Pt returned to room and performed ambulatory transfer to bed with min assist and no AD. Sit>supine completed with supervision assist, and left supine in bed with call bell in reach and all needs met.  ? ? ?   ? ?Therapy Documentation ?Precautions:  ?Precautions ?Precautions: Fall ?Precaution Comments: Bil mitts, abdominal binder all the time, TEDs only on during therapy then removed ?Restrictions ?Weight Bearing Restrictions: No ? ?  ?Vital Signs: ?Oxygen Therapy ?O2 Device: Room Air ?Pain: ?denies ? ? ?Therapy/Group: Individual Therapy ? ?[name]?04/13/2022, 11:38 AM  ?

## 2022-04-13 NOTE — Progress Notes (Signed)
Occupational Therapy Session Note ? ?Patient Details  ?Name: TAVEON ENYEART ?MRN: [id number] ?Date of Birth: 04-02-1961 ? ?Today's Date: 04/13/2022 ?OT Individual Time: 0702-0759 ?OT Individual Time Calculation (min): 57 min  ? ? ?Short Term Goals: ?Week 1:  OT Short Term Goal 1 (Week 1): Pt will complete 2 grooming tasks at sink with mod A. ?OT Short Term Goal 1 - Progress (Week 1): Not met ?OT Short Term Goal 2 (Week 1): Pt will complete sit to stand at sink with mod A. ?OT Short Term Goal 2 - Progress (Week 1): Not met ?OT Short Term Goal 3 (Week 1): Pt will don shirt with max A. ?OT Short Term Goal 3 - Progress (Week 1): Not met ?OT Short Term Goal 4 (Week 1): Pt will transfer to toilet with max A and LRAD. ?OT Short Term Goal 4 - Progress (Week 1): Not met ?Week 2:  OT Short Term Goal 1 (Week 2): Pt will complete 2 grooming tasks at sink with mod A. ?OT Short Term Goal 2 (Week 2): Pt will complete sit to stand at sink with mod A. ?OT Short Term Goal 2 - Progress (Week 2): Met ?OT Short Term Goal 3 (Week 2): Pt will don shirt with max A. ?OT Short Term Goal 3 - Progress (Week 2): Met ?OT Short Term Goal 4 (Week 2): Pt will transfer to toilet with max A and LRAD. ?OT Short Term Goal 4 - Progress (Week 2): Met ?OT Short Term Goal 5 (Week 2): Pt will participate in 30 min of OT session in 2/3 attemps. ?OT Short Term Goal 5 - Progress (Week 2): Met ?Week 3:  OT Short Term Goal 1 (Week 3): STG = LTG 2/2 ELOS ? ?Skilled Therapeutic Interventions/Progress Updates:  ?  Pt received asleep in bed but easily awakened to voice, no s/sx pain throughout, agreeable to therapy. Session focus on self-care retraining, activity tolerance, transfer retraining in prep for improved ADL/IADL/func mobility performance + decreased caregiver burden. ? ?Came to sitting EOB with close S and assist to remove covers. Donned pants with min A due to threading LLE into incorrect pant leg. Stand-pivot no AD with CGA to TIS on his L.  Combed hair  thoroughly with S and completed oral care with suction toothbrush with set-up A and overall S, did require assist to cover suction hole. ? ?Set-up A and S provided during breakfast, able to self-feed 95% of meal with LUE. LPN in/out morning meds.  ? ?Ambulated to and from toilet with min L HHA. Mod A to pull down pants/underwear. Had incontinent episode of bladder as condom cath had fell off upon exiting bed. Total A to change out brief.  ? ?Pt oriented to month, but not day of week or year when given written field of 2. Ambulated back to bed min HHA and returned to supine with S in prep for MBS transport. ? ?Pt left with HOB at 30 degrees with B safety mitts on, with bed alarm engaged, call bell in reach, and all immediate needs met. 4 rails up per pt preference. ? ? ?Therapy Documentation ?Precautions:  ?Precautions ?Precautions: Fall ?Precaution Comments: Bil mitts, abdominal binder all the time, TEDs only on during therapy then removed ?Restrictions ?Weight Bearing Restrictions: No ? ?Pain: no s/sx throughout ?  ?ADL: See Care Tool for more details. ? ? ?Therapy/Group: Individual Therapy ? ?Volanda Napoleon  MS, OTR/L ? ? ?04/13/2022, 6:57 AM ?

## 2022-04-13 NOTE — Progress Notes (Signed)
Modified Barium Swallow Progress Note ? ?Patient Details  ?Name: Todd Estrada ?MRN: [id number] ?Date of Birth: 05-28-1961 ? ?Today's Date: 04/13/2022 ? ?Modified Barium Swallow completed.  Full report located under Chart Review in the Imaging Section. ? ?Brief recommendations include the following: ? ?Clinical Impression ? Patient seen for repeat MBS. Results are suggestive of improvement in oropharyngeal swallow function as compared to initial study obtained on 4/04. Pt currently presents with a mild oropharyngeal dysphagia. Oral deficits are characterized by decreased oral manipulation, delayed oral transit, and premature loss of bolus to the pyriform sinuses with thin liquids. Pt had mildy prolonged but functional mastication with mechanical soft textures. Patient exhibited consistent penetration of trace amounts above the vocal folds before and during the swallow with thin liquids - more often during the swallow; x1 during the swallow with nectar thick liquids. No aspiration events were identified before, during, or after the swallow under fluoro. There was residue in the valleculae and pyriform sinuses with all tested consistencies. Slightly increased pharyngeal residue was observed with nectar thick liquids as compared to thin liquids. Pt exhibited increased pharyngeal clearance with puree and mechanical soft consistencies. Pt consistently implemented secondary swallow which was effective in clearing most pharyngeal residuals, with trace amount remaining posts swallow. Pt appeared to sense and execute secondary swallow independent of cues. It should be noted pyriform sinus residue did not contribute to penetration/aspiration events as observed during initial MBS. Pt was unable to orally clear barium tablet despite additional puree and thin liquid boluses and tablet was removed from oral cavity. SLP recommends dysphagia 2 diet with thin liquids and crushed meds. Full supervision is recommended for swallow safety.  May upgrade to Dysphagia 3 textures clinically, as able. ?  ?Swallow Evaluation Recommendations ? ? ? SLP Diet Recommendations: Dysphagia 2 (Fine chop) solids;Thin liquid ? ? Liquid Administration via: Cup;Straw ? ? Medication Administration: Crushed with puree ? ? Supervision: Full supervision/cueing for compensatory strategies;Patient able to self feed ? ? Compensations: Minimize environmental distractions;Slow rate;Small sips/bites ? ? Postural Changes: Seated upright at 90 degrees ? ? Oral Care Recommendations: Oral care BID ? ?   ? ? ? ?[name] [name] ?04/13/2022,5:30 PM ?

## 2022-04-13 NOTE — Patient Care Conference (Signed)
Inpatient RehabilitationTeam Conference and Plan of Care Update ?Date: 04/13/2022   Time: 10:13 AM  ? ? ?Patient Name: Todd Estrada      ?Medical Record Number: [id number]  ?Date of Birth: 1961/07/27 ?Sex: Male         ?Room/Bed: 4W11C/4W11C-01 ?Payor Info: Payor: [employer] / Plan: [place] MEDICAID [employer] OF [place] / Product Type: *No Product type* /   ? ?Admit Date/Time:  03/22/2022  2:44 PM ? ?Primary Diagnosis:  Acute ischemic left MCA stroke ([place]) ? ?Hospital Problems: Principal Problem: ?  Acute ischemic left MCA stroke ([place]) ?Active Problems: ?  Chronic respiratory failure with hypoxia (HCC) ?  Dyslipidemia ?  Acute lower UTI ?  Dysphagia, post-stroke ? ? ? ?Expected Discharge Date: Expected Discharge Date: 04/15/22 ? ?Team Members Present: ?Physician leading conference: Dr. Alysia Penna ?Social Worker Present: Erlene Quan, BSW ?Nurse Present: Dorien Chihuahua, RN ?PT Present: Barrie Folk, PT ?OT Present: Providence Lanius, OT ?SLP Present: Sherren Kerns, SLP ?PPS Coordinator present : Gunnar Fusi, SLP ? ?   Current Status/Progress Goal Weekly Team Focus  ?Bowel/Bladder ? ?           ?Swallow/Nutrition/ Hydration ? ? Dys 2 diet, thin liquids - upgraded following MBS this morning  sup A  Education with pt/family regarding results of MBS, diet advancement, swallowing precautions   ?ADL's ? ? max A for bathing shower level, min A UB/LBD, max A for toileting, min HHA ambulatory bathroom transfers; cousin present for partial fam ed session  min A transfers, mod A bathing, LBD, toileting  RUE NMR, transfer/balance/self-care retraining, func cog, pt/family/DME/AE education   ?Mobility ? ? Bed mobility = supervision, Standing transfers =. CGA/Min A, Gait =. min A with RW and +2 for w/c follow  Min assist gait. supervision assist bed mobility. CGA trasnfers to gait.  improved attention. family education. endurance. NMR for RLE/RUE, safety in all mobility   ?Communication ? ? Max A  for verbal repetition; min A for yes/no responses, min-to-mod A for communciating functional needs through multimodal communciation  Comprehension of basic auditory information - Min A; Multimodal communication of wants/needs - Min A  Verbal repetition, yes/no question responses, multimodal communciation   ?Safety/Cognition/ Behavioral Observations ? min A orientation, sup A attention; can be limited by fatigue  Sustianed attention - Min A; Orientation to person, place, time, and situation - Supervision  orientation, attention, safety awareness   ?Pain ? ?           ?Skin ? ?           ? ? ?Discharge Planning:  ?Discharging home with cousin to assist. Cousin had education schuedled last week   ?Team Discussion: ?Patient is on target for meeting goals for discharge. Cousin in for training and reported comfortable with care needs. Will need to keep G-tube through 04/23/22.  ? ?Patient on target to meet rehab goals: ?yes, currently needs max -total assist for bathing and min assist for dressing. Requires max assist for toileting. Requires min assist for gait both with and without an assistive device over a dynamic environment.Goals for discharge set for min assist overall. ? ?*See Care Plan and progress notes for long and short-term goals.  ? ?Revisions to Treatment Plan:  ?Car transfer training ?Step training  ?Teaching Needs: ?Safety, skin care, medications, PEG care, toileting, transfers, etc  ?Current Barriers to Discharge: ?Decreased caregiver support ? ?Possible Resolutions to Barriers: ?Family education ?HH follow up services ?DME: TTB,  RW, W/C ?  ? ? Medical Summary ?Current Status: Trach removed , still has G tube , remains aphasic and apraxic , severe hemiplegia ? Barriers to Discharge: Incontinence ?  ?Possible Resolutions to [employer] Focus: teach family G tube flush, set up OP visit with IR for PEG removal on or after 5/6 ? ? ?Continued Need for Acute Rehabilitation Level of Care: The patient requires  daily medical management by a physician with specialized training in physical medicine and rehabilitation for the following reasons: ?Direction of a multidisciplinary physical rehabilitation program to maximize functional independence : Yes ?Medical management of patient stability for increased activity during participation in an intensive rehabilitation regime.: Yes ?Analysis of laboratory values and/or radiology reports with any subsequent need for medication adjustment and/or medical intervention. : Yes ? ? ?I attest that I was present, lead the team conference, and concur with the assessment and plan of the team. ? ? ?Dorien Chihuahua B ?04/13/2022, 3:56 PM  ? ? ? ? ? ? ?

## 2022-04-13 NOTE — Progress Notes (Signed)
Patient ID: Todd Estrada, male   DOB: 05/22/1961, 61 y.o.   MRN: [id number] ? ?Sw made attempt to call patient cousin, [name] to provided team conference updates. Sw left detailed VM will wait for follow up. ?

## 2022-04-13 NOTE — Progress Notes (Signed)
?                                                       PROGRESS NOTE ? ? ?Subjective/Complaints: ? ?Coughing with meals noted yesterday , SLP to repeat MBS today  ? ?ROS: Unable to obtain due to aphasia/dysarthria. ? ?Objective: ?  ?No results found. ?No results for input(s): WBC, HGB, HCT, PLT in the last 72 hours. ? ?Recent Labs  ?  04/11/22 ?0634  ?NA 137  ?K 4.2  ?CL 100  ?CO2 30  ?GLUCOSE 102*  ?BUN 20  ?CREATININE 0.90  ?CALCIUM 9.3  ? ? ? ? ?Intake/Output Summary (Last 24 hours) at 04/13/2022 0939 ?Last data filed at 04/13/2022 [phone] ?Gross per 24 hour  ?Intake 700 ml  ?Output 900 ml  ?Net -200 ml  ? ?  ? ?Pressure Injury 03/22/22 Heel Right;Lateral Deep Tissue Pressure Injury - Purple or maroon localized area of discolored intact skin or blood-filled blister due to damage of underlying soft tissue from pressure and/or shear. (Active)  ?03/22/22 1500  ?Location: [place]  ?Location Orientation: Right;Lateral  ?Staging: Deep Tissue Pressure Injury - Purple or maroon localized area of discolored intact skin or blood-filled blister due to damage of underlying soft tissue from pressure and/or shear.  ?Wound Description (Comments):   ?Present on Admission: Yes  ? ? ?Physical Exam: ?Vital Signs ?Blood pressure 101/74, pulse 74, temperature 98.8 ?F (37.1 ?C), resp. rate 16, weight 77.9 kg, SpO2 94 %. ? ?Trach site with bandage no audible airleak  ?General: No acute distress ?Mood and affect are appropriate ?Heart: Regular rate and rhythm no rubs murmurs or extra sounds ?Lungs: Clear to auscultation, breathing unlabored, no rales or wheezes ?Abdomen: Positive bowel sounds, soft nontender to palpation, nondistended ?Extremities: No clubbing, cyanosis, or edema ? ?Musculoskeletal: Full range of motion in all 4 extremities. No joint swelling ? ?Skin: +PEG in LUQ, min redness . ?Neuro: Alert.  Motor strength 4/5 LUE/LLE, 3/5 RUE/RLE ?Musculoskeletal: FROM all extremities, without joint swelling. ?Psych: Mood and affect  appropriate. ? ? ?Assessment/Plan: ?1. Functional deficits which require 3+ hours per day of interdisciplinary therapy in a comprehensive inpatient rehab setting. ?Physiatrist is providing close team supervision and 24 hour management of active medical problems listed below. ?Physiatrist and rehab team continue to assess barriers to discharge/monitor patient progress toward functional and medical goals ? ?Care Tool: ? ?Bathing ?   ?Body parts bathed by patient: Face  ? Body parts bathed by helper: Right arm, Left arm, Chest, Abdomen, Front perineal area, Buttocks, Right upper leg, Left upper leg, Right lower leg, Left lower leg ?  ?  ?Bathing assist Assist Level: Total Assistance - Patient < 25% ?  ?  ?Upper Body Dressing/Undressing ?Upper body dressing   ?What is the patient wearing?: Pull over shirt ?   ?Upper body assist Assist Level: Minimal Assistance - Patient > 75% ?   ?Lower Body Dressing/Undressing ?Lower body dressing ? ? ?   ?What is the patient wearing?: Pants ? ?  ? ?Lower body assist Assist for lower body dressing: Minimal Assistance - Patient > 75% ?   ? ?Toileting ?Toileting    ?Toileting assist Assist for toileting: Moderate Assistance - Patient 50 - 74% ?  ?  ?Transfers ?Chair/bed transfer ? ?Transfers assist ?   ? ?Chair/bed transfer  assist level: Minimal Assistance - Patient > 75% ?  ?  ?Locomotion ?Ambulation ? ? ?Ambulation assist ? ?   ? ?Assist level: Minimal Assistance - Patient > 75% (MinA +2) ?Assistive device: Walker-rolling ?Max distance: 10  ? ?Walk 10 feet activity ? ? ?Assist ?   ? ?Assist level: Minimal Assistance - Patient > 75% (MinA +2) ?Assistive device: Walker-rolling  ? ?Walk 50 feet activity ? ? ?Assist Walk 50 feet with 2 turns activity did not occur: Safety/medical concerns ? ?  ?   ? ? ?Walk 150 feet activity ? ? ?Assist Walk 150 feet activity did not occur: Safety/medical concerns ? ?  ?  ?  ? ?Walk 10 feet on uneven surface  ?activity ? ? ?Assist Walk 10 feet on uneven  surfaces activity did not occur: Safety/medical concerns ? ? ?  ?   ? ?Wheelchair ? ? ? ? ?Assist Is the patient using a wheelchair?: Yes ?Type of Wheelchair: Manual (per PT note, TIS w/c) ?  ? ?Wheelchair assist level: Dependent - Patient 0% ?Max wheelchair distance: 150  ? ? ?Wheelchair 50 feet with 2 turns activity ? ? ? ?Assist ? ?  ?  ? ? ?Assist Level: Dependent - Patient 0%  ? ?Wheelchair 150 feet activity  ? ? ? ?Assist ?   ? ? ?Assist Level: Dependent - Patient 0%  ? ?Blood pressure 101/74, pulse 74, temperature 98.8 ?F (37.1 ?C), resp. rate 16, weight 77.9 kg, SpO2 94 %. ? ?Medical Problem List and Plan: ?1. Functional deficits secondary to left MCA stroke with right-sided weakness, slurred speech and expressive aphasia on admission ? Continue CIR PT, OT. SLP, plan d/c 4/28 ? ? ?2.  Antithrombotics: ?-DVT/anticoagulation:  Pharmaceutical: Lovenox stopped due to hematuria, use SCDs  ?-4/22 Pt tolerating SCD's well, hematuria resolved. ?            -antiplatelet therapy: Plavix and aspirin ?3. Pain Management: Tylenol prn ?4. Mood: LCSW to evaluate and provide emotional support ?            -antipsychotic agents: Seroquel 25 mg q HS ?            --Ativan prn agitation; continue restraints for now ?5. Neuropsych: This patient is not capable of making decisions on his own behalf. ?6. Skin/Wound Care: Routine skin care checks ?            --known pressure injury versus bruise>>? Left buttock ?            --dry, ischemic ulcers bilateral toes ?7. Fluids/Electrolytes/Nutrition: Routine Is and Os ?           oral intake improving recheck BMET in am  ?8: Left ICA occlusion s/p embolectomy by IR on 3/15 ?            --on Plavix and aspirin ?9: CHF: "eventually resume metoprolol 12.5 mg BID", does not appear to be in fluid overload  ?10: Hypertension: continue TID monitoring ? ?  04/13/2022  ?  5:06 AM 04/12/2022  ?  7:32 PM 04/12/2022  ?  2:28 PM  ?Vitals with BMI  ?Systolic 644 96 99  ?Diastolic 74 59 63  ?Pulse 74 75  75  ? Low normal BP off BP meds  ?11: Hyperlipidemia: continue Lipitor  40 mg daily ?12: Seizure-type activity: continue Keppra 500 mg BID ?13: Alcohol abuse: provide cessation counseling- ETOH neg on admission  ?            --  continue thiamine supplementation ? 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            -stable, PCCM following - no exacerbation following trach removal  ?17: Tobacco abuse: continue nicotine patch ?            --IS- needs to avoid tobacco products will wean nicoderm  ?18: GERD/GI prophylaxis: continue Protonix ? 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 PET CT examination is recommended ?for further evaluation. Associated left hilar pathologic adenopathy. ?Will need to do as OP, will f/u pulmonary  ?20: Transaminitis:  ? ALT mildly elevated on 4/5 ?21. Left MCA stroke 08/2021 with petechial hemorrhage on aspirin monotherapy ?22. Loose stools- due to Colace? Suggest bulking up stools at this point.  ? ? ?  Latest Ref Rng & Units 04/05/2022  ?  5:31 AM 04/04/2022  ?  5:28 AM 04/03/2022  ?  5:02 AM  ?CBC  ?WBC 4.0 - 10.5 K/uL 9.2   8.2   9.9    ?Hemoglobin 13.0 - 17.0 g/dL 14.3   13.0   13.1    ?Hematocrit 39.0 - 52.0 % 42.6   40.0   39.5    ?Platelets 150 - 400 K/uL 229   252   261    ?  ?24. PAD RIght SFA occlusion VVS eval appreciated, ABI reviewed and shows moderate to severe arterial disease no clincal signs of critical limb ischemia,  cannot express other symptoms such as claudication due to aphasia following CVA, needs OP f/u with VVS  ?25. Lethargy: multifactorial, Likely due to hypoxia and UTI  , now improved ?26. Hematuria- has stopped, tubing has clear urine, pt likely had external trauma related to freq removal and reapplication of condom cath while receiving lovenox , plavix and ASA, will d/c lovenox ,  ? CBC WNL on 4/16 and 4/17 ,4/16 dc daily cbc ?27.   Dysphagia improving  PEG placed 03/11/22 eating 100% meals , still needs H2O flush,earliest PEG removal 04/23/22 ?Would rec IR f/u for G tube removal  ?28. Redness around PEG tube site: ?No infection, looks m

## 2022-04-13 NOTE — Progress Notes (Signed)
Physical Therapy Session Note ? ?Patient Details  ?Name: Todd Estrada ?MRN: [id number] ?Date of Birth: April 01, 1961 ? ?Today's Date: 04/12/2022 ?PT Individual Time:  1117-1200  ?PT Individual Time Calculations (min): 43 min ? ?Short Term Goals: ?Week 3:  PT Short Term Goal 1 (Week 3): STG=LTG due to ELOS ? ? ?Skilled Therapeutic Interventions/Progress Updates:  ?Patient seated in TIS w/c on entrance to room. Patient alert and agreeable to PT session. Mitts doffed.  ? ?Patient with no pain complaint throughout session. ? ?Therapeutic Activity: ?Transfers: Patient performed sit<>stand and stand pivot transfers throughout session with impusivity and requires SUP for sit<>stands and CGA/MinA for stand pivots and cued for balance with standing and stepping. ? ?Gait Training:  ?Patient ambulated 24' x1/ 45' x1 using RW with CGA/ MinA for walker mgmt. Demonstrated kick into R rear leg of walker. Provided vc/ tc for walker mgmt, R attention, upright posture. Pt also guided in lateral stepping covering 15' to each direction with HHA to therapist's forearms and provided with CGA at hips to cue for neutral pelvic rotation. ? ?Neuromuscular Re-ed: ?NMR facilitated during session with focus on standing blance/ cognition. Pt guided in color match of bean bag to disc on table in front of pt. Requires cueing to scan further to right side in order to find final 2 colors when self selecting bean bags. With self select, he correctly matches all 6 colors. Pt does not demonstrate ability to choose correct color when calling for specific color and bags placed to pt's R side. Unable to state any of the six colors. NMR performed for improvements in motor control and coordination, balance, sequencing, judgement, and self confidence/ efficacy in performing all aspects of mobility at highest level of independence.  ? ?Patient seated upright  in TIS w/c at end of session with brakes locked, belt and seat pad alarm set, and all needs within reach.  NT arriving with lunch.  ? ? ?Therapy Documentation ?Precautions:  ?Precautions ?Precautions: Fall ?Precaution Comments: Bil mitts, abdominal binder all the time, TEDs only on during therapy then removed ?Restrictions ?Weight Bearing Restrictions: No ?General: ?  ?Vital Signs: ? ?Pain: ? No pain complaint this session.  ? ?Therapy/Group: Individual Therapy ? ?Alger Simons PT, DPT ?04/12/2022, 5:16 PM  ?

## 2022-04-13 NOTE — Progress Notes (Signed)
Speech Language Pathology Daily Session Note ? ?Patient Details  ?Name: OTHO MICHALIK ?MRN: [id number] ?Date of Birth: 1961/09/13 ? ?Today's Date: 04/13/2022 ?SLP Individual Time: 7824-2353 ?SLP Individual Time Calculation (min): 45 min ? ?Short Term Goals: ?Week 3: SLP Short Term Goal 1 (Week 3): Patient will consume current diet with minimal overt s/sx of aspiration and min A cues for implementation of safe swallowing precautions and strategies ?SLP Short Term Goal 2 (Week 3): Patient will consume trials of ice chips with minimal overt s/s of aspiration over 2 sessions to assess readiness for repeat MBS. ?SLP Short Term Goal 3 (Week 3): Patient will demonstrate sustained attention to functional/structured tasks for 30 minutes with Min verbal cues for redirection. ?SLP Short Term Goal 4 (Week 3): Patient will respond to basic yes/no questions in regards to biographical and enviornmental information with min A verbal/visual cues to achieve 75% accuracy ?SLP Short Term Goal 5 (Week 3): Pt will communicate basic wants/needs via multimodal communication and min A cues ? ?Skilled Therapeutic Interventions: Pt seen for assessment of diet tolerance with noon meal following MBS today with recent advancement to thin liquids. Pt consumed Dys 2 textures and thin liquids requiring mod A fading to min A verbal cues to reduce bolus volume and rate of consumption d/t impulsivity and decreased self monitoring skills during meal. It should be noted that large bites and large, sequential sips of thin liquids did not result in increased pharyngeal symptoms and patient consumed meal without overt s/sx of aspiration and clear vocal quality post swallows. Pt continues to exhibit mild anterior spillage on R with inconsistent awareness; cleared effectively when given washcloth + verbal cue. Recommend continuation of Dys 2 diet, thin liquids, crushed meds, and full supervision with all intake. Per pt's permission, SLP called pt's cousin,  [name], and provided update on MBS results and education re: diet recommendations, swallowing precautions, crushed meds, and supervising meals due to impulsivity. SLP also reinforced no alcoholic beverages and for cousin to manage pt's medications, as pt will be unable secondary to cognitive/language deficits. Cousin verbalized understanding through teach back. Handouts were given last week during family education regarding Dys 2 diet and swallowing precautions. Patient was left in bed with alarm activated and immediate needs within reach at end of session. Continue per current plan of care.   ?   ?Pain ? None ? ?Therapy/Group: Individual Therapy ? ?Meridian Scherger T Freeda Spivey ?04/13/2022, 3:32 PM ?

## 2022-04-13 NOTE — Plan of Care (Signed)
?  Problem: RH Ambulation ?Goal: LTG Patient will ambulate in controlled environment (PT) ?Description: LTG: Patient will ambulate in a controlled environment, # of feet with assistance (PT). ?Flowsheets ?Taken 04/13/2022 1014 ?LTG: Ambulation distance in controlled environment: 49ft with LRAD ?Taken 03/24/2022 0655 ?LTG: Pt will ambulate in controlled environ  assist needed:: Minimal Assistance - Patient > 75% ?  ?

## 2022-04-14 ENCOUNTER — Other Ambulatory Visit (HOSPITAL_COMMUNITY): Payer: Self-pay

## 2022-04-14 DIAGNOSIS — J439 Emphysema, unspecified: Secondary | ICD-10-CM | POA: Diagnosis present

## 2022-04-14 MED ORDER — CLOPIDOGREL BISULFATE 75 MG PO TABS
75.0000 mg | ORAL_TABLET | Freq: Every day | ORAL | 0 refills | Status: DC
  Filled 2022-04-14: qty 30, 30d supply, fill #0

## 2022-04-14 MED ORDER — LEVETIRACETAM 500 MG PO TABS
500.0000 mg | ORAL_TABLET | Freq: Two times a day (BID) | ORAL | 0 refills | Status: DC
  Filled 2022-04-14: qty 60, 30d supply, fill #0

## 2022-04-14 MED ORDER — PANTOPRAZOLE SODIUM 40 MG PO TBEC
40.0000 mg | DELAYED_RELEASE_TABLET | Freq: Every day | ORAL | 0 refills | Status: DC
  Filled 2022-04-14: qty 30, 30d supply, fill #0

## 2022-04-14 MED ORDER — GUAIFENESIN 100 MG/5ML PO LIQD: 15.0000 mL | Freq: Four times a day (QID) | ORAL | 0 refills | Status: DC | PRN

## 2022-04-14 MED ORDER — REVEFENACIN 175 MCG/3ML IN SOLN
175.0000 ug | Freq: Every day | RESPIRATORY_TRACT | 0 refills | Status: DC
  Filled 2022-04-14: qty 90, 30d supply, fill #0

## 2022-04-14 MED ORDER — FOLIC ACID 1 MG PO TABS
1.0000 mg | ORAL_TABLET | Freq: Every day | ORAL | 0 refills | Status: DC
  Filled 2022-04-14: qty 30, 30d supply, fill #0

## 2022-04-14 MED ORDER — THIAMINE HCL 100 MG PO TABS
100.0000 mg | ORAL_TABLET | Freq: Every day | ORAL | 0 refills | Status: DC
  Filled 2022-04-14: qty 30, 30d supply, fill #0

## 2022-04-14 MED ORDER — BACITRACIN ZINC 500 UNIT/GM EX OINT
TOPICAL_OINTMENT | Freq: Two times a day (BID) | CUTANEOUS | 0 refills | Status: DC
  Filled 2022-04-14: qty 28.4, 14d supply, fill #0

## 2022-04-14 MED ORDER — GUAIFENESIN 100 MG/5ML PO LIQD: 15.0000 mL | Freq: Four times a day (QID) | ORAL | Status: DC | PRN

## 2022-04-14 MED ORDER — ASPIRIN 81 MG PO TBEC
81.0000 mg | DELAYED_RELEASE_TABLET | Freq: Every day | ORAL | 0 refills | Status: DC
  Filled 2022-04-14: qty 30, 30d supply, fill #0

## 2022-04-14 MED ORDER — STIOLTO RESPIMAT 2.5-2.5 MCG/ACT IN AERS
2.0000 | INHALATION_SPRAY | Freq: Every day | RESPIRATORY_TRACT | 0 refills | Status: DC
Start: 2022-04-14 — End: 2022-05-04
  Filled 2022-04-14: qty 4, 30d supply, fill #0

## 2022-04-14 MED ORDER — THIAMINE HCL 100 MG PO TABS
100.0000 mg | ORAL_TABLET | Freq: Every day | ORAL | 0 refills | Status: DC
Start: 2022-04-14 — End: 2022-04-14
  Filled 2022-04-14: qty 30, 30d supply, fill #0

## 2022-04-14 MED ORDER — BUDESONIDE 0.5 MG/2ML IN SUSP
0.5000 mg | Freq: Two times a day (BID) | RESPIRATORY_TRACT | 0 refills | Status: DC
Start: 2022-04-14 — End: 2022-05-04
  Filled 2022-04-14: qty 60, 15d supply, fill #0

## 2022-04-14 MED ORDER — ADULT MULTIVITAMIN W/MINERALS CH
1.0000 | ORAL_TABLET | Freq: Every day | ORAL | 0 refills | Status: DC
Start: 2022-04-15 — End: 2022-05-09
  Filled 2022-04-14: qty 30, 30d supply, fill #0

## 2022-04-14 MED ORDER — ACETAMINOPHEN 325 MG PO TABS: 325.0000 mg | ORAL_TABLET | ORAL | Status: DC | PRN

## 2022-04-14 MED ORDER — BREZTRI AEROSPHERE 160-9-4.8 MCG/ACT IN AERO
INHALATION_SPRAY | RESPIRATORY_TRACT | 0 refills | Status: DC
  Filled 2022-04-14: qty 10.7, 30d supply, fill #0

## 2022-04-14 MED ORDER — FREE WATER
100.0000 mL | Freq: Three times a day (TID) | Status: DC
  Administered 2022-04-14 – 2022-04-15 (×4): 100 mL

## 2022-04-14 MED ORDER — ATORVASTATIN CALCIUM 40 MG PO TABS
40.0000 mg | ORAL_TABLET | Freq: Every day | ORAL | 0 refills | Status: DC
  Filled 2022-04-14: qty 40, 40d supply, fill #0

## 2022-04-14 MED ORDER — ARFORMOTEROL TARTRATE 15 MCG/2ML IN NEBU
15.0000 ug | INHALATION_SOLUTION | Freq: Two times a day (BID) | RESPIRATORY_TRACT | 0 refills | Status: DC
  Filled 2022-04-14: qty 120, 30d supply, fill #0

## 2022-04-14 NOTE — Progress Notes (Signed)
?                                                       PROGRESS NOTE ? ? ?Subjective/Complaints: ? ?Aphasic , discussed MBS result with SLP  ? ?ROS: Unable to obtain due to aphasia/dysarthria. ? ?Objective: ?  ?DG Swallowing Func-Speech Pathology ? ?Result Date: 04/13/2022 ?Table formatting from the original result was not included. Objective Swallowing Evaluation: Type of Study: MBS-Modified Barium Swallow Study  Patient Details Name: Todd Estrada MRN: [id number] Date of Birth: 1961/06/13 Today's Date: 04/13/2022 Time: SLP Start Time (ACUTE ONLY): 0945 -SLP Stop Time (ACUTE ONLY): 1008 SLP Time Calculation (min) (ACUTE ONLY): 23 min Past Medical History: Past Medical History: Diagnosis Date  Hypertension  Past Surgical History: Past Surgical History: Procedure Laterality Date  ESOPHAGOGASTRODUODENOSCOPY (EGD) WITH PROPOFOL N/A 03/11/2022  Procedure: ESOPHAGOGASTRODUODENOSCOPY (EGD) WITH PROPOFOL;  Surgeon: Georganna Skeans, MD;  Location: [place];  Service: General;  Laterality: N/A;  IR ANGIO INTRA EXTRACRAN SEL COM CAROTID INNOMINATE UNI R MOD SED  03/02/2022  IR CT HEAD LTD  03/02/2022  IR PERCUTANEOUS ART THROMBECTOMY/INFUSION INTRACRANIAL INC DIAG ANGIO  03/02/2022  PEG PLACEMENT N/A 03/11/2022  Procedure: PERCUTANEOUS ENDOSCOPIC GASTROSTOMY (PEG) PLACEMENT;  Surgeon: Georganna Skeans, MD;  Location: [place];  Service: General;  Laterality: N/A;  RADIOLOGY WITH ANESTHESIA N/A 03/02/2022  Procedure: IR WITH ANESTHESIA;  Surgeon: Luanne Bras, MD;  Location: [place];  Service: Radiology;  Laterality: N/A; HPI: 61 y/o male with a history of Left MCA stroke in 0630 with a complicated post stroke course presented on 3/15 with a 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  He was extubated following procedure, but reintubated due to respiratory failure.  Self extubated overnight 3/19, now on venturi mask.  Re-intubated on 3/21 and bronchoscopy and tracheostomy performed on 3/22. Prior CVA  resulted in mild to moderate aphasia.  Subjective: Pt resting in bed, awake. Sonorous breathing sounds and wet vocal quality noted. No family present  Recommendations for follow up therapy are one component of a multi-disciplinary discharge planning process, led by the attending physician.  Recommendations may be updated based on patient status, additional functional criteria and insurance authorization. Assessment / Plan / Recommendation   04/13/2022   5:35 PM Clinical Impressions Clinical Impression Patient seen for repeat MBS. Results are suggestive of improvement in oropharyngeal swallow function as compared to initial study obtained on 4/04. Pt currently presents with a mild oropharyngeal dysphagia. Oral deficits are characterized by decreased oral manipulation, delayed oral transit, and premature loss of bolus to the pyriform sinuses with thin liquids. Pt had mildy prolonged but functional mastication with mechanical soft textures. Patient exhibited consistent penetration of trace amounts above the vocal folds before and during the swallow with thin liquids - more often during the swallow; x1 during the swallow with nectar thick liquids. No aspiration events were identified before, during, or after the swallow under fluoro. There was residue in the valleculae and pyriform sinuses with all tested consistencies. Slightly increased pharyngeal residue was observed with nectar thick liquids as compared to thin liquids. Pt exhibited increased pharyngeal clearance with puree and mechanical soft consistencies. Pt consistently implemented secondary swallow which was effective in clearing most pharyngeal residuals, with trace amount remaining posts swallow. Pt appeared to sense and execute secondary swallow independent  of cues. It should be noted pyriform sinus residue did not contribute to penetration/aspiration events as was observed during initial MBS. Pt was unable to orally clear barium tablet despite additional  puree and thin liquid boluses and tablet was removed from oral cavity. SLP recommends dysphagia 2 diet with thin liquids and crushed meds. Full supervision is recommended for swallow safety. May upgrade to Dysphagia 3 textures clinically, as able.     04/13/2022   5:20 PM Treatment Recommendations Treatment Recommendations Therapy as outlined in treatment plan below     04/13/2022   5:29 PM Prognosis Prognosis for Safe Diet Advancement Good Barriers to Reach Goals Language deficits;Cognitive deficits   04/13/2022   5:20 PM Diet Recommendations SLP Diet Recommendations Dysphagia 2 (Fine chop) solids;Thin liquid Liquid Administration via Cup;Straw Medication Administration Crushed with puree Compensations Minimize environmental distractions;Slow rate;Small sips/bites Postural Changes Seated upright at 90 degrees     04/13/2022   5:20 PM Other Recommendations Oral Care Recommendations Oral care BID Follow Up Recommendations Acute inpatient rehab (3hours/day) Assistance recommended at discharge Frequent or constant Supervision/Assistance Functional Status Assessment Patient has had a recent decline in their functional status and demonstrates the ability to make significant improvements in function in a reasonable and predictable amount of time.   03/22/2022  10:00 AM Frequency and Duration  Speech Therapy Frequency (ACUTE ONLY) min 2x/week Treatment Duration 2 weeks     04/13/2022   4:18 PM Oral Phase Oral Phase Impaired Oral - Nectar Teaspoon Weak lingual manipulation;Lingual pumping Oral - Nectar Cup Weak lingual manipulation;Delayed oral transit Oral - Nectar Straw NT Oral - Thin Teaspoon Weak lingual manipulation;Delayed oral transit Oral - Thin Cup Weak lingual manipulation;Delayed oral transit;Premature spillage Oral - Thin Straw Premature spillage;Delayed oral transit;Weak lingual manipulation Oral - Puree Delayed oral transit Oral - Mech Soft Delayed oral transit;Lingual/palatal residue Oral - Regular NT Oral -  Multi-Consistency NT Oral - Pill Weak lingual manipulation;Other (Comment)    04/13/2022   4:29 PM Pharyngeal Phase Pharyngeal Phase Impaired Pharyngeal- Nectar Teaspoon Delayed swallow initiation-vallecula;Pharyngeal residue - valleculae;Pharyngeal residue - pyriform Pharyngeal- Nectar Cup Delayed swallow initiation-vallecula;Penetration/Aspiration during swallow;Pharyngeal residue - valleculae;Pharyngeal residue - pyriform Pharyngeal Material enters airway, remains ABOVE vocal cords then ejected out Pharyngeal- Nectar Straw NT Pharyngeal- Thin Teaspoon Delayed swallow initiation-vallecula;Pharyngeal residue - valleculae;Pharyngeal residue - pyriform Pharyngeal- Thin Cup Delayed swallow initiation-vallecula;Delayed swallow initiation-pyriform sinuses;Pharyngeal residue - valleculae;Pharyngeal residue - pyriform;Penetration/Aspiration before swallow;Penetration/Aspiration during swallow Pharyngeal Material enters airway, remains ABOVE vocal cords then ejected out Pharyngeal- Thin Straw Delayed swallow initiation-vallecula;Pharyngeal residue - valleculae;Pharyngeal residue - pyriform Pharyngeal- Puree Pharyngeal residue - valleculae Pharyngeal- Mechanical Soft Pharyngeal residue - valleculae Pharyngeal- Regular NT Pharyngeal- Multi-consistency NT Pharyngeal- Pill --    04/13/2022   4:41 PM Cervical Esophageal Phase  Cervical Esophageal Phase Impaired Brianne T Garretson 04/13/2022, 5:36 PM                     ?No results for input(s): WBC, HGB, HCT, PLT in the last 72 hours. ? ?No results for input(s): NA, K, CL, CO2, GLUCOSE, BUN, CREATININE, CALCIUM in the last 72 hours. ? ? ? ?Intake/Output Summary (Last 24 hours) at 04/14/2022 0912 ?Last data filed at 04/14/2022 2778 ?Gross per 24 hour  ?Intake 840 ml  ?Output 200 ml  ?Net 640 ml  ? ?  ? ?  ? ? ?Physical Exam: ?Vital Signs ?Blood pressure 97/64, pulse 70, temperature 98.8 ?F (37.1 ?C), temperature source Oral, resp. rate 18, weight 77.9  kg, SpO2 93 %. ? ?Trach site  with bandage no audible airleak  ?General: No acute distress ?Mood and affect are appropriate ?Heart: Regular rate and rhythm no rubs murmurs or extra sounds ?Lungs: Clear to auscultation, breathing unlabored, n

## 2022-04-14 NOTE — Progress Notes (Signed)
Speech Language Pathology Discharge Summary ? ?Patient Details  ?Name: Todd Estrada ?MRN: [id number] ?Date of Birth: 1961/01/20 ? ?Today's Date: 04/14/2022 ?SLP Individual Time: 1031-5945 ?SLP Individual Time Calculation (min): 60 min ? ?Skilled Therapeutic Interventions: Skilled ST treatment focused on communication goals. Patient continues to demonstrate excellent progress toward communication goals, spontaneous verbalizations, and response to cueing hierarchy. SLP facilitated orientation with field of 3 written choices with sup A verbal cues. Pt was oriented to person, month, year, place, city/state; required verbal explanation for orientation to situation. SLP facilitated auditory comprehension of basic yes/no questions with sup A verbal cues to achieve 90% accuracy; min A cues for complex questions to achieve 70% accuracy. SLP facilitated functional word production by providing max A multimodal cues including verbal repetition, auditory close phrase, visual/articulatory cues, intoning, gestures, and expectant look to produce target word (e.g., "my name is ___"). Pt was able to successfully approximate and eventually produce the following words: "Delyle", "help", "hungry", "thirst", "bathroom", "headache." Pt and SLP very pleased with this progress. SLP reinforced diet recommendations and provided updated swallowing precautions handout for pt's cousin at discharge; placed form in pt's stroke binder. Patient was left in bed with alarm activated and immediate needs within reach at end of session.  ? ?Patient has met 5 of 6 long term goals.  Patient to discharge at [facility] level.  ? ?Reasons goals not met: Pt requiring increased verbal cueing for implementation of swallowing strategies with intake  ? ?Clinical Impression/Discharge Summary: Patient has made excellent gains and has met 5 of 6 long-term goals this admission due to improved expressive/receptive language skills, sustained attention, orientation, and  oropharyngeal swallow function. Improved swallowing further supported by repeat MBS which was obtained 4/26. Patient is currently at min A for basic auditory comprehension, min A for expressing basic needs through multimodal communication, min-to-mod A for implementation of swallow strategies, sup A for orientation, and sup A for sustained attention. Patient is currently consuming a dysphagia 2 diet and thin liquids with crushed meds and full supervision with all intake. Pt/family education completed. Care partner is independent to provide the necessary physical and cognitive assistance at discharge. Recommend 24 hour supervision and follow up SLP services in home health setting to maximize swallowing, language, and cognitive function and functional independence.  ? ?Care Partner:  ?Caregiver Able to Provide Assistance: Yes  ?Type of Caregiver Assistance: Physical;Cognitive ? ?Recommendation:  ?Home Health SLP;24 hour supervision/assistance  ?Rationale for SLP Follow Up: Maximize functional communication;Maximize swallowing safety;Maximize cognitive function and independence;Reduce caregiver burden  ? ?Equipment: Suction kit provided  ? ?Reasons for discharge: Treatment goals met;Discharged from hospital  ? ?Patient/Family Agrees with Progress Made and Goals Achieved: Yes  ? ? ?Todd Estrada ?04/14/2022, 3:57 PM ? ?

## 2022-04-14 NOTE — Progress Notes (Signed)
Physical Therapy Discharge Summary ? ?Patient Details  ?Name: Todd Estrada ?MRN: [id number] ?Date of Birth: 20-Jul-1961 ? ?Today's Date: 04/14/2022 ?PT Individual Time: 1000-1045 ?PT Individual Time Calculation (min): 45 min  ? ? ?Patient has met 10 of 10 long term goals due to improved activity tolerance, improved balance, improved postural control, increased strength, increased range of motion, decreased pain, ability to compensate for deficits, functional use of  right lower extremity, improved attention, improved awareness, and improved coordination.  Patient to discharge at an ambulatory level [place].   Patient's care partner is independent to provide the necessary physical and cognitive assistance at discharge. ? ?Reasons goals not met: all PT goals met  ? ?Recommendation:  ?Patient will benefit from ongoing skilled PT services in home health setting to continue to advance safe functional mobility, address ongoing impairments in balance transfers, safety, strength, coordinaiton, and minimize fall risk. ? ?Equipment: ?WC.  ? ?Reasons for discharge: treatment goals met and discharge from hospital ? ? ?Patient/family agrees with progress made and goals achieved: Yes ? ?PT treatment:  ?Pt received supine in bed and agreeable to PT. Supine>sit transfer with supervision  assist and cues for safety. Donning clean pants and brief sitting EOB with mod assist and supervision assist for safety. PT instructed pt in Grad day assessment to measure progress toward goals. See below for details. CARETool mobility assessment  also completed; see CAREtool tab in navigator for details. ? ?Gait training with no AD x 131f with min assist-CGA for safety. Stair management training with 1 rail and min assist to improve step length on the RLE and moderate cues for step to gait pattern on descent. Car transfer traning with no AD and min assist for safety. Pt returned to room and performed stand pivot transfer to bed with supervision  assist and LUE supported on bed rail. Sit>supine completed without assist, and left supine in bed with call bell in reach and all needs met.  ? ? ? ?PT Discharge ?Precautions/Restrictions ?Precautions ?Precautions: Fall ?Precaution Comments: Bil mitts, abdominal binder all the time, TEDs only on during therapy then removed ?Restrictions ?Weight Bearing Restrictions: No ?Vital Signs ?Therapy Vitals ?Resp: 18 ?Oxygen Therapy ?SpO2: 93 % ?O2 Device: Room Air ?Pain ?Pain Assessment ?Pain Scale: 0-10 ?Pain Score: 0-No pain ?Pain Interference ?Pain Interference ?Pain Effect on Sleep: 1. Rarely or not at all ?Pain Interference with Therapy Activities: 1. Rarely or not at all ?Pain Interference with Day-to-Day Activities: 1. Rarely or not at all ?Vision/Perception  ?Vision - History ?Ability to See in Adequate Light: 3 Highly impaired (often running into objects on ground, requires extra cuing to fully turn prior to sitting on surfaces) ?Vision - Assessment ?Eye Alignment: Impaired (comment) (dysconjugate gaze, R) ?Ocular Range of Motion: Restricted on the right ?Alignment/Gaze Preference: Gaze left ?Tracking/Visual Pursuits: Decreased smoothness of eye movement to RIGHT superior field;Decreased smoothness of eye movement to RIGHT inferior field ?Saccades: Decreased speed of saccadic movement ?Convergence: Impaired (comment) (R eye does not converge) ?Perception ?Perception: Impaired ?Inattention/Neglect: Does not attend to right visual field;Does not attend to right side of body ?Praxis ?Praxis: Impaired ?Praxis Impairment Details: Ideomotor  ?Cognition ?Overall Cognitive Status: History of cognitive impairments - at baseline ?Arousal/Alertness: Awake/alert ?Attention: Sustained ?Sustained Attention: Impaired ?Memory: Impaired ?Awareness: Impaired ?Awareness Impairment: Intellectual impairment ?Problem Solving: Impaired ?Problem Solving Impairment: Verbal basic;Functional basic ?Behaviors: Impulsive ?Safety/Judgment:  Impaired (impulsive with mobility, attempting to sit prior to fully turning, but much improved ability to wait for therapist cuing prior  to transferring) ?Comments: difficulty fully assessing due to global aphasia, but pt much more alert than eval and "yeah"/ no answers appear to be fairly accurate ?Sensation ?Sensation ?Light Touch: Impaired Detail ?Light Touch Impaired Details: Impaired RUE;Impaired RLE ?Proprioception: Impaired Detail ?Proprioception Impaired Details: Impaired RUE;Impaired RLE ?Additional Comments: able to dectect stimuli to RLE>RUE ?Coordination ?Gross Motor Movements are Fluid and Coordinated: No ?Fine Motor Movements are Fluid and Coordinated: No ?Coordination and Movement Description: R sided hemiplegia ?Finger Nose Finger Test: unable to follow directions ?Heel Shin Test: poor coordinaiton on the RLE, unable to follow instruction ?Motor  ?Motor ?Motor: Hemiplegia;Motor apraxia ?Motor - Discharge Observations: R sided hemiplegia. UE>LE  ?Mobility ?Bed Mobility ?Bed Mobility: Rolling Right;Rolling Left;Sit to Supine;Supine to Sit ?Rolling Right: Supervision/verbal cueing ?Rolling Left: Supervision/Verbal cueing ?Supine to Sit: Supervision/Verbal cueing ?Sit to Supine: Supervision/Verbal cueing ?Transfers ?Transfers: Sit to Stand;Stand to [employer] Transfers ?Stand to Sit: Supervision/Verbal cueing ?Stand Pivot Transfers: Contact Guard/Touching assist ?Locomotion  ?Gait ?Ambulation: Yes ?Gait Assistance: Contact Guard/Touching assist;Minimal Assistance - Patient > 75% ?Gait Distance (Feet): 100 Feet ?Assistive device: 1 person hand held assist ?Gait ?Gait: Yes ?Gait Pattern: Impaired ?Gait Pattern: Ataxic;Step-through pattern;Wide base of support ?Stairs / Additional Locomotion ?Stairs: Yes ?Stairs Assistance: Minimal Assistance - Patient > 75% ?Stair Management Technique: One rail Left ?Number of Stairs: 4 ?Height of Stairs: 6 ?Ramp: Minimal Assistance - Patient >75% ?Wheelchair  Mobility ?Wheelchair Assistance: Supervision/Verbal cueing ?Wheelchair Propulsion: Left lower extremity ?Wheelchair Parts Management: Needs assistance ?Distance: 125f  ?Trunk/Postural Assessment  ?Cervical Assessment ?C[facility] Assessment: Ex[facility] to [facility]?Thoracic Assessment ?[facility]oracic Assessment: Ex[facility] to [facility]?Lumbar Assessment ?Lumbar Assessment: Exceptions to [facility]?Postural Control ?Postural Control: Deficits on evaluation ?Protective Responses: delayed to the R  ?Balance ?Balance ?Balance Assessed: Yes ?Static Sitting Balance ?Static Sitting - Balance Support: Feet supported;No upper extremity supported ?Static Sitting - Level of Assistance: 5: Stand by assistance ?Dynamic Sitting Balance ?Dynamic Sitting - Balance Support: Feet supported ?Dynamic Sitting - Level of Assistance: 5: Stand by assistance ?Static Standing Balance ?Static Standing - Balance Support: Left upper extremity supported;During functional activity ?Static Standing - Level of Assistance: 5: Stand by assistance ?Dynamic Standing Balance ?Dynamic Standing - Balance Support: During functional activity;Left upper extremity supported ?Dynamic Standing - Level of Assistance: 5: Stand by assistance;4: Min assist ?Extremity Assessment  ?  ?  ?RLE Assessment ?RLE Assessment: Ex[facility] to [facility]?General Strength Comments: functionally 4+/5 through standing. 4//5 for knee extension, hip flexion, knee flexion through MMT. ?LLE Assessment ?LLE Assessment: Ex[facility] to [facility]?General Strength Comments: grossly 4/5 to 4+/5 through functional movement ? ? ? ?[name]?04/14/2022, 10:53 AM ?

## 2022-04-14 NOTE — Plan of Care (Signed)
?  Problem: RH Swallowing ?Goal: LTG Patient will participate in dysphagia therapy to increase swallow function with assistance (SLP) ?Description: LTG:  Patient will participate in dysphagia therapy to increase swallow function with assistance (SLP) ?Outcome: Not Met (add Reason) ?Note: Pt requiring min A at discharge for implementation of safe swallowing strategies ?  ?Problem: RH Swallowing ?Goal: LTG Pt will demonstrate functional change in swallow as evidenced by bedside/clinical objective assessment (SLP) ?Description: LTG: Patient will demonstrate functional change in swallow as evidenced by bedside/clinical objective assessment (SLP) ?Outcome: Completed/Met ?  ?Problem: RH Cognition - SLP ?Goal: RH LTG Patient will demonstrate orientation with cues ?Description:  LTG:  Patient will demonstrate orientation to person/place/time/situation with cues (SLP)   ?Outcome: Completed/Met ?  ?Problem: RH Comprehension Communication ?Goal: LTG Patient will comprehend basic/complex auditory (SLP) ?Description: LTG: Patient will comprehend basic/complex auditory information with cues (SLP). ?Outcome: Completed/Met ?  ?Problem: RH Expression Communication ?Goal: LTG Patient will express needs/wants via multi-modal(SLP) ?Description: LTG:  Patient will express needs/wants via multi-modal communication (gestures/written, etc) with cues (SLP) ?Outcome: Completed/Met ?  ?Problem: RH Attention ?Goal: LTG Patient will demonstrate this level of attention during functional activites (SLP) ?Description: LTG:  Patient will will demonstrate this level of attention during functional activites (SLP) ?Outcome: Completed/Met ?  ?

## 2022-04-14 NOTE — Plan of Care (Signed)
?  Problem: RH Bathing ?Goal: LTG Patient will bathe all body parts with assist levels (OT) ?Description: LTG: Patient will bathe all body parts with assist levels (OT) ?Outcome: Not Met (add Reason) ?Note: Continues to require up to total A for thorough bathing due to poor initiation/perseverating on bathing face only. ?  ?Problem: RH Vision ?Goal: RH LTG Vision (Specify) ?Outcome: Not Met (add Reason) ?Note: Pt continues to require significant cuing for locating items on  his R due to low vision/inattention. ?  ?Problem: RH Balance ?Goal: LTG: Patient will maintain dynamic sitting balance (OT) ?Description: LTG:  Patient will maintain dynamic sitting balance with assistance during activities of daily living (OT) ?Outcome: Completed/Met ?Goal: LTG Patient will maintain dynamic standing with ADLs (OT) ?Description: LTG:  Patient will maintain dynamic standing balance with assist during activities of daily living (OT)  ?Outcome: Completed/Met ?  ?Problem: Sit to Stand ?Goal: LTG:  Patient will perform sit to stand in prep for activites of daily living with assistance level (OT) ?Description: LTG:  Patient will perform sit to stand in prep for activites of daily living with assistance level (OT) ?Outcome: Completed/Met ?  ?Problem: RH Grooming ?Goal: LTG Patient will perform grooming w/assist,cues/equip (OT) ?Description: LTG: Patient will perform grooming with assist, with/without cues using equipment (OT) ?Outcome: Completed/Met ?  ?Problem: RH Dressing ?Goal: LTG Patient will perform upper body dressing (OT) ?Description: LTG Patient will perform upper body dressing with assist, with/without cues (OT). ?Outcome: Completed/Met ?Goal: LTG Patient will perform lower body dressing w/assist (OT) ?Description: LTG: Patient will perform lower body dressing with assist, with/without cues in positioning using equipment (OT) ?Outcome: Completed/Met ?  ?Problem: RH Toileting ?Goal: LTG Patient will perform toileting task (3/3  steps) with assistance level (OT) ?Description: LTG: Patient will perform toileting task (3/3 steps) with assistance level (OT)  ?Outcome: Completed/Met ?  ?Problem: RH Functional Use of Upper Extremity ?Goal: LTG Patient will use RT/LT upper extremity as a (OT) ?Description: LTG: Patient will use right/left upper extremity as a stabilizer/gross assist/diminished/nondominant/dominant level with assist, with/without cues during functional activity (OT) ?Outcome: Completed/Met ?  ?Problem: RH Toilet Transfers ?Goal: LTG Patient will perform toilet transfers w/assist (OT) ?Description: LTG: Patient will perform toilet transfers with assist, with/without cues using equipment (OT) ?Outcome: Completed/Met ?  ?Problem: RH Tub/Shower Transfers ?Goal: LTG Patient will perform tub/shower transfers w/assist (OT) ?Description: LTG: Patient will perform tub/shower transfers with assist, with/without cues using equipment (OT) ?Outcome: Completed/Met ?  ?Problem: RH Attention ?Goal: LTG Patient will demonstrate this level of attention during functional activites (OT) ?Description: LTG:  Patient will demonstrate this level of attention during functional activites  (OT) ?Outcome: Completed/Met ?  ?

## 2022-04-14 NOTE — Progress Notes (Signed)
Occupational Therapy Discharge Summary ? ?Patient Details  ?Name: Todd Estrada ?MRN: [id number] ?Date of Birth: 29-Feb-1961 ? ?Today's Date: 04/14/2022 ?OT Individual Time: 4562-5638 ?OT Individual Time Calculation (min): 57 min  ? ? ?Patient has met 11 of 13 long term goals due to improved activity tolerance, improved balance, postural control, ability to compensate for deficits, functional use of  RIGHT upper and RIGHT lower extremity, improved attention, improved awareness, and improved coordination.  Patient to discharge at overall  min to mod A  level.  Patient's care partner is independent to provide the necessary physical and cognitive assistance at discharge.  Pt to DC at the below ADL/bathroom transfer performance level. Assist levels represent pt's most consistent performance when alert/participatory. Pt continues to be primarily limited by global aphasia, RUE>RLE hemiparesis, poor activity tolerance. Family education completed with cousin [name], demonstrates good understanding of pt's current deficits and impact on BADL/mobility performance. Verbalizes understanding of rec for 24/7 close S. Pt and caregivers will benefit from continued [facility] OT to facilitate improved caregiver education, functional mobility, and occupational performance.  ? ? ?Reasons goals not met: Pt continues to require up to max A/total A for bathing tasks due to poor initiation and perseveration. Pt require up to max multimodal cuing to visually scan to his R due to low vision/R inattention. ? ?Recommendation:  ?Patient will benefit from ongoing skilled OT services in home health setting to continue to advance functional skills in the area of BADL and Reduce care partner burden. ? ?Equipment: ?TTB ? ?Reasons for discharge: treatment goals met and discharge from hospital ? ?Patient/family agrees with progress made and goals achieved: Yes ? ?OT Discharge ?Precautions/Restrictions  ?Precautions ?Precautions: Fall ?Precaution Comments:  abdominal binder all the time, TEDs only on during therapy then removed ?Restrictions ?Weight Bearing Restrictions: No ? ?Pain ?Pain Assessment ?Pain Scale: 0-10 ?Pain Score: 0-No painNo s/sx throughout ?ADL ?ADL ?Eating: Supervision/safety ?Where Assessed-Eating: Chair ?Grooming: Supervision/safety ?Where Assessed-Grooming: Sitting at sink ?Upper Body Bathing: Maximal assistance ?Where Assessed-Upper Body Bathing: Sitting at sink ?Lower Body Bathing: Maximal assistance ?Where Assessed-Lower Body Bathing: Bed level ?Upper Body Dressing: Minimal assistance ?Where Assessed-Upper Body Dressing: Edge of bed ?Lower Body Dressing: Minimal assistance ?Where Assessed-Lower Body Dressing: Edge of bed ?Toileting: Maximal assistance ?Where Assessed-Toileting: Bed level ?Toilet Transfer: Minimal assistance ?Toilet Transfer Method: Ambulating ?Science writer: Grab bars, Bedside commode ?Tub/Shower Transfer: Not assessed ?Walk-In Shower Transfer: Minimal assistance ?Walk-In Shower Transfer Method: Ambulating ?Walk-In Shower Equipment: Radio broadcast assistant, Grab bars ?Vision ?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) ?Patient Visual Report: Other (comment);Blurring of vision (endorses impaired vision, but unable to fully state due to global language deficits) ?Vision Assessment?: Yes ?Eye Alignment: Impaired (comment) (dysconjugate gaze, R) ?Ocular Range of Motion: Restricted on the right ?Alignment/Gaze Preference: Gaze left ?Tracking/Visual Pursuits: Decreased smoothness of eye movement to RIGHT superior field;Decreased smoothness of eye movement to RIGHT inferior field ?Saccades: Decreased speed of saccadic movement ?Convergence: Impaired (comment) (R eye does not converge) ?Visual Fields: Other (comment) (dfficulty fully assessing due to low vision at baseline and R inattention) ?Additional Comments: dfficulty fully assessing due to low vision at baseline and R inattention ?Perception  ?Perception:  Impaired ?Inattention/Neglect: Does not attend to right visual field;Does not attend to right side of body ?Praxis ?Praxis: Impaired ?Praxis Impairment Details: Ideomotor ?Cognition ?Cognition ?Overall Cognitive Status: (P) History of cognitive impairments - at baseline (Per family, 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 Cousin stated pt presenting slightly  below baseline from a language standpoint) ?Arousal/Alertness: (P) Awake/alert ?Memory: (P) Impaired (difficult to assess 2/2 aphasia) ?Attention: (P) Focused;Sustained ?Focused Attention: (P) Appears intact ?Sustained Attention: (P) Appears intact ?Sensation ?Sensation ?Light Touch: Impaired Detail ?Light Touch Impaired Details: Impaired RUE;Impaired RLE ?Hot/Cold: Appears Intact ?Proprioception: Impaired Detail ?Proprioception Impaired Details: Impaired RUE;Impaired RLE ?Additional Comments: able to dectect stimuli to RLE>RUE ?Coordination ?Gross Motor Movements are Fluid and Coordinated: No ?Fine Motor Movements are Fluid and Coordinated: No ?Coordination and Movement Description: R sided hemiplegia ?Finger Nose Finger Test: unable to follow directions ?Motor  ?Motor ?Motor: Hemiplegia;Motor apraxia ?Motor - Discharge Observations: R sided hemiplegia. UE>LE ?Mobility  ?Bed Mobility ?Bed Mobility: Sit to Supine;Supine to Sit ?Supine to Sit: Supervision/Verbal cueing ?Sit to Supine: Supervision/Verbal cueing ?Transfers ?Sit to Stand: Supervision/Verbal cueing ?Stand to Sit: Supervision/Verbal cueing  ?Trunk/Postural Assessment  ?   ?Balance ?Balance ?Balance Assessed: Yes ?Static Sitting Balance ?Static Sitting - Balance Support: Feet supported;No upper extremity supported ?Static Sitting - Level of Assistance: 5: Stand by assistance ?Dynamic Sitting Balance ?Dynamic Sitting - Balance Support: Feet supported ?Dynamic Sitting - Level of Assistance: 5: Stand by assistance ?Static Standing Balance ?Static Standing - Balance Support: Left  upper extremity supported;During functional activity ?Static Standing - Level of Assistance: 5: Stand by assistance ?Dynamic Standing Balance ?Dynamic Standing - Balance Support: During functional activity;Left upper extremity supported ?Dynamic Standing - Level of Assistance: 5: Stand by assistance;4: Min assist ?Extremity/Trunk Assessment ?RUE Assessment ?RUE Assessment: Exceptions to [facility] ?RUE Body System: Neuro ?Brunstrum levels for arm and hand: Arm;Hand ?Brunstrum level for arm: Stage IV Movement is deviating from synergy;Stage III Synergy is performed voluntarily ?Brunstrum level for hand: Stage I Flaccidity;Stage II Synergy is developing ?LUE Assessment ?LUE Assessment: Within Functional Limits ? ?Session Note: Pt received semi-reclined in bed, no c/o pain, agreeable to therapy. Session focus on self-care retraining, activity tolerance, transfer retraining in prep for improved ADL/IADL/func mobility performance + decreased caregiver burden. ? ?Came to sitting EOB close S. Doffed shirt in standing with close S and min A to initiate pulling over head. Able to wash face with S, requiring hand over hand assist to bathe under arms, but declined bathing further, offering therapist wash close. S for brushing hair and min A to thread RUE into new shirt. Ambulated to and from toilet with min HHA, bumping into things on R. Able to pull his pants/underwear down/up this date with CGA, but required total A for posterior pericare post continent void of bowel. Continues to place his L hand near perinium, soiling hand with poor awareness.  ? ?Pt completed TTB transfer with min A to manage RLE over side of tube, recliner transfer with CGA, and close S from supine <> sit in apartment bed. ? ?Finally, pt practiced simple command follow, sorting objects by color and type to target functional cognition: ? -pt able to sort silverware with overall min A to initially separate utensils ? -pt able to recreate 3 stack of legos, but  total A for error correction to copy colors from model ? -sorted pts by yellow/red color with overall max A. Difficulty fully assessing problem solving abilities due to global aphasia. ? ?Pt left seated in w/c with safety

## 2022-04-14 NOTE — Progress Notes (Signed)
Patient ID: Todd Estrada, male   DOB: 12/04/1961, 61 y.o.   MRN: [id number] ? ?Nebulizer package ordered through [employer]   ?

## 2022-04-14 NOTE — Progress Notes (Signed)
Inpatient Rehabilitation Discharge Medication Review by a Pharmacist ? ?A complete drug regimen review was completed for this patient to identify any potential clinically significant medication issues. ? ?High Risk Drug Classes Is patient taking? Indication by Medication  ?Antipsychotic No   ?Anticoagulant No   ?Antibiotic No   ?Opioid No   ?Antiplatelet Yes Aspirin, plavix- CVA prophylaxis  ?Hypoglycemics/insulin No   ?Vasoactive Medication No   ?Chemotherapy No   ?Other Yes Lipitor- HLD ?Protonix- GERD ?Keppra- seizure prophylaxis  ? ? ? ?Type of Medication Issue Identified Description of Issue Recommendation(s)  ?Drug Interaction(s) (clinically significant) ?    ?Duplicate Therapy ?    ?Allergy ?    ?No Medication Administration End Date ?    ?Incorrect Dose ?    ?Additional Drug Therapy Needed ?    ?Significant med changes from prior encounter (inform family/care partners about these prior to discharge).    ?Other ?    ? ? ?Clinically significant medication issues were identified that warrant physician communication and completion of prescribed/recommended actions by midnight of the next day:  No ? ?Time spent performing this drug regimen review (minutes):  30 ? ? ?Vartan Kerins BS, PharmD, BCPS ?Clinical Pharmacist ?04/15/2022 10:11 AM ?

## 2022-04-14 NOTE — Progress Notes (Signed)
Inpatient Rehabilitation Care Coordinator ?Discharge Note  ? ?Patient Details  ?Name: Todd Estrada ?MRN: [id number] ?Date of Birth: 03-21-1961 ? ? ?Discharge location: Home ? ?Length of Stay: 24 Days ? ?Discharge activity level: Sup/Min A ? ?Home/community participation: Family ? ?Patient response VE:HMCNOB Literacy - How often do you need to have someone help you when you read instructions, pamphlets, or other written material from your doctor or pharmacy?: Always ? ?Patient response SJ:GGEZMO Isolation - How often do you feel lonely or isolated from those around you?: Never ? ?Services provided included: SW, Pharmacy, TR, CM, RN, OT, SLP, PT, RD, MD ? ?Financial Services:  ?Charity fundraiser Utilized: Medicaid ?  ? ?Choices offered to/list presented to: patient family ? ?Follow-up services arranged:  ?Home Health ?[facility] Agency: [facility]  ?  ?  ?  ? ?Patient response to transportation need: ?Is the patient able to respond to transportation needs?: Yes ?In the past 12 months, has lack of transportation kept you from medical appointments or from getting medications?: No ?In the past 12 months, has lack of transportation kept you from meetings, work, or from getting things needed for daily living?: No ? ? ? ?Comments (or additional information): ? ?Patient/Family verbalized understanding of follow-up arrangements:  Yes ? ?Individual responsible for coordination of the follow-up plan: [name] Maudry Diego) ? ?Confirmed correct DME delivered: Dyanne Iha 04/14/2022   ? ?Dyanne Iha ?

## 2022-04-15 ENCOUNTER — Other Ambulatory Visit (HOSPITAL_COMMUNITY): Payer: Self-pay

## 2022-04-15 MED ORDER — PANTOPRAZOLE SODIUM 40 MG PO PACK: 40.0000 mg | PACK | Freq: Every day | ORAL | 0 refills | Status: DC

## 2022-04-15 NOTE — Progress Notes (Signed)
?                                                       PROGRESS NOTE ? ? ?Subjective/Complaints: ? ?Aphasic but has partial comprehension  ? ?ROS: Unable to obtain due to aphasia/dysarthria. ? ?Objective: ?  ?DG Swallowing Func-Speech Pathology ? ?Result Date: 04/13/2022 ?Table formatting from the original result was not included. Objective Swallowing Evaluation: Type of Study: MBS-Modified Barium Swallow Study  Patient Details Name: Todd Estrada MRN: [id number] Date of Birth: Apr 18, 1961 Today's Date: 04/13/2022 Time: SLP Start Time (ACUTE ONLY): 0945 -SLP Stop Time (ACUTE ONLY): 1008 SLP Time Calculation (min) (ACUTE ONLY): 23 min Past Medical History: Past Medical History: Diagnosis Date  Hypertension  Past Surgical History: Past Surgical History: Procedure Laterality Date  ESOPHAGOGASTRODUODENOSCOPY (EGD) WITH PROPOFOL N/A 03/11/2022  Procedure: ESOPHAGOGASTRODUODENOSCOPY (EGD) WITH PROPOFOL;  Surgeon: Georganna Skeans, MD;  Location: [facility];  Service: General;  Laterality: N/A;  IR ANGIO INTRA EXTRACRAN SEL COM CAROTID INNOMINATE UNI R MOD SED  03/02/2022  IR CT HEAD LTD  03/02/2022  IR PERCUTANEOUS ART THROMBECTOMY/INFUSION INTRACRANIAL INC DIAG ANGIO  03/02/2022  PEG PLACEMENT N/A 03/11/2022  Procedure: PERCUTANEOUS ENDOSCOPIC GASTROSTOMY (PEG) PLACEMENT;  Surgeon: Georganna Skeans, MD;  Location: [facility];  Service: General;  Laterality: N/A;  RADIOLOGY WITH ANESTHESIA N/A 03/02/2022  Procedure: IR WITH ANESTHESIA;  Surgeon: Luanne Bras, MD;  Location: [facility];  Service: Radiology;  Laterality: N/A; HPI: 61 y/o male with a history of Left MCA stroke in 8469 with a complicated post stroke course presented on 3/15 with a 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  He was extubated following procedure, but reintubated due to respiratory failure.  Self extubated overnight 3/19, now on venturi mask.  Re-intubated on 3/21 and bronchoscopy and tracheostomy performed on 3/22. Prior CVA  resulted in mild to moderate aphasia.  Subjective: Pt resting in bed, awake. Sonorous breathing sounds and wet vocal quality noted. No family present  Recommendations for follow up therapy are one component of a multi-disciplinary discharge planning process, led by the attending physician.  Recommendations may be updated based on patient status, additional functional criteria and insurance authorization. Assessment / Plan / Recommendation   04/13/2022   5:35 PM Clinical Impressions Clinical Impression Patient seen for repeat MBS. Results are suggestive of improvement in oropharyngeal swallow function as compared to initial study obtained on 4/04. Pt currently presents with a mild oropharyngeal dysphagia. Oral deficits are characterized by decreased oral manipulation, delayed oral transit, and premature loss of bolus to the pyriform sinuses with thin liquids. Pt had mildy prolonged but functional mastication with mechanical soft textures. Patient exhibited consistent penetration of trace amounts above the vocal folds before and during the swallow with thin liquids - more often during the swallow; x1 during the swallow with nectar thick liquids. No aspiration events were identified before, during, or after the swallow under fluoro. There was residue in the valleculae and pyriform sinuses with all tested consistencies. Slightly increased pharyngeal residue was observed with nectar thick liquids as compared to thin liquids. Pt exhibited increased pharyngeal clearance with puree and mechanical soft consistencies. Pt consistently implemented secondary swallow which was effective in clearing most pharyngeal residuals, with trace amount remaining posts swallow. Pt appeared to sense and execute secondary swallow independent of cues.  It should be noted pyriform sinus residue did not contribute to penetration/aspiration events as was observed during initial MBS. Pt was unable to orally clear barium tablet despite additional  puree and thin liquid boluses and tablet was removed from oral cavity. SLP recommends dysphagia 2 diet with thin liquids and crushed meds. Full supervision is recommended for swallow safety. May upgrade to Dysphagia 3 textures clinically, as able.     04/13/2022   5:20 PM Treatment Recommendations Treatment Recommendations Therapy as outlined in treatment plan below     04/13/2022   5:29 PM Prognosis Prognosis for Safe Diet Advancement Good Barriers to Reach Goals Language deficits;Cognitive deficits   04/13/2022   5:20 PM Diet Recommendations SLP Diet Recommendations Dysphagia 2 (Fine chop) solids;Thin liquid Liquid Administration via Cup;Straw Medication Administration Crushed with puree Compensations Minimize environmental distractions;Slow rate;Small sips/bites Postural Changes Seated upright at 90 degrees     04/13/2022   5:20 PM Other Recommendations Oral Care Recommendations Oral care BID Follow Up Recommendations Acute inpatient rehab (3hours/day) Assistance recommended at discharge Frequent or constant Supervision/Assistance Functional Status Assessment Patient has had a recent decline in their functional status and demonstrates the ability to make significant improvements in function in a reasonable and predictable amount of time.   03/22/2022  10:00 AM Frequency and Duration  Speech Therapy Frequency (ACUTE ONLY) min 2x/week Treatment Duration 2 weeks     04/13/2022   4:18 PM Oral Phase Oral Phase Impaired Oral - Nectar Teaspoon Weak lingual manipulation;Lingual pumping Oral - Nectar Cup Weak lingual manipulation;Delayed oral transit Oral - Nectar Straw NT Oral - Thin Teaspoon Weak lingual manipulation;Delayed oral transit Oral - Thin Cup Weak lingual manipulation;Delayed oral transit;Premature spillage Oral - Thin Straw Premature spillage;Delayed oral transit;Weak lingual manipulation Oral - Puree Delayed oral transit Oral - Mech Soft Delayed oral transit;Lingual/palatal residue Oral - Regular NT Oral -  Multi-Consistency NT Oral - Pill Weak lingual manipulation;Other (Comment)    04/13/2022   4:29 PM Pharyngeal Phase Pharyngeal Phase Impaired Pharyngeal- Nectar Teaspoon Delayed swallow initiation-vallecula;Pharyngeal residue - valleculae;Pharyngeal residue - pyriform Pharyngeal- Nectar Cup Delayed swallow initiation-vallecula;Penetration/Aspiration during swallow;Pharyngeal residue - valleculae;Pharyngeal residue - pyriform Pharyngeal Material enters airway, remains ABOVE vocal cords then ejected out Pharyngeal- Nectar Straw NT Pharyngeal- Thin Teaspoon Delayed swallow initiation-vallecula;Pharyngeal residue - valleculae;Pharyngeal residue - pyriform Pharyngeal- Thin Cup Delayed swallow initiation-vallecula;Delayed swallow initiation-pyriform sinuses;Pharyngeal residue - valleculae;Pharyngeal residue - pyriform;Penetration/Aspiration before swallow;Penetration/Aspiration during swallow Pharyngeal Material enters airway, remains ABOVE vocal cords then ejected out Pharyngeal- Thin Straw Delayed swallow initiation-vallecula;Pharyngeal residue - valleculae;Pharyngeal residue - pyriform Pharyngeal- Puree Pharyngeal residue - valleculae Pharyngeal- Mechanical Soft Pharyngeal residue - valleculae Pharyngeal- Regular NT Pharyngeal- Multi-consistency NT Pharyngeal- Pill --    04/13/2022   4:41 PM Cervical Esophageal Phase  Cervical Esophageal Phase Impaired Brianne T Garretson 04/13/2022, 5:36 PM                     ?No results for input(s): WBC, HGB, HCT, PLT in the last 72 hours. ? ?No results for input(s): NA, K, CL, CO2, GLUCOSE, BUN, CREATININE, CALCIUM in the last 72 hours. ? ? ? ?Intake/Output Summary (Last 24 hours) at 04/15/2022 0816 ?Last data filed at 04/15/2022 [phone] ?Gross per 24 hour  ?Intake 840 ml  ?Output 801 ml  ?Net 39 ml  ? ?  ? ?  ? ? ?Physical Exam: ?Vital Signs ?Blood pressure 103/65, pulse 73, temperature 98.1 ?F (36.7 ?C), resp. rate 18, weight 77.9 kg, SpO2 93 %. ? ?  Trach site with bandage no audible  airleak  ?General: No acute distress ?Mood and affect are appropriate ?Heart: Regular rate and rhythm no rubs murmurs or extra sounds ?Lungs: Clear to auscultation, breathing unlabored, no rales or wheezes ?Abdomen

## 2022-04-15 NOTE — Progress Notes (Signed)
Patient ID: Todd Estrada, male   DOB: August 06, 1961, 61 y.o.   MRN: [id number] Spoke with cousin-james he will be here before 5:00 needs to pick up daughter in-law from work. Have let pt know. ?

## 2022-04-15 NOTE — Progress Notes (Signed)
Patient ID: Todd Estrada, male   DOB: [birth year], 62 y.o.   MRN: [id number] ?Supplies for PEG care through ~04/23/22 left with the patient for discharge. RN to review PEG care and flush with the cousin for discharge today. Dorien Chihuahua B ? ?

## 2022-04-15 NOTE — Plan of Care (Signed)
?  Problem: RH Balance Goal: LTG Patient will maintain dynamic sitting balance (PT) Description: LTG:  Patient will maintain dynamic sitting balance with assistance during mobility activities (PT) Outcome: Completed/Met Goal: LTG Patient will maintain dynamic standing balance (PT) Description: LTG:  Patient will maintain dynamic standing balance with assistance during mobility activities (PT) Outcome: Completed/Met   Problem: RH Bed Mobility Goal: LTG Patient will perform bed mobility with assist (PT) Description: LTG: Patient will perform bed mobility with assistance, with/without cues (PT). Outcome: Completed/Met   Problem: RH Bed to Chair Transfers Goal: LTG Patient will perform bed/chair transfers w/assist (PT) Description: LTG: Patient will perform bed to chair transfers with assistance (PT). Outcome: Completed/Met   Problem: RH Car Transfers Goal: LTG Patient will perform car transfers with assist (PT) Description: LTG: Patient will perform car transfers with assistance (PT). Outcome: Completed/Met   Problem: RH Ambulation Goal: LTG Patient will ambulate in controlled environment (PT) Description: LTG: Patient will ambulate in a controlled environment, # of feet with assistance (PT). Outcome: Completed/Met Goal: LTG Patient will ambulate in home environment (PT) Description: LTG: Patient will ambulate in home environment, # of feet with assistance (PT). Outcome: Completed/Met   Problem: RH Wheelchair Mobility Goal: LTG Patient will propel w/c in controlled environment (PT) Description: LTG: Patient will propel wheelchair in controlled environment, # of feet with assist (PT) Outcome: Completed/Met Goal: LTG Patient will propel w/c in home environment (PT) Description: LTG: Patient will propel wheelchair in home environment, # of feet with assistance (PT). Outcome: Completed/Met   Problem: RH Stairs Goal: LTG Patient will ambulate up and down stairs w/assist  (PT) Description: LTG: Patient will ambulate up and down # of stairs with assistance (PT) Outcome: Completed/Met   

## 2022-04-15 NOTE — Progress Notes (Signed)
Patient discharged to home, transported by Livia Snellen (relative). ?

## 2022-04-19 ENCOUNTER — Telehealth: Payer: Self-pay

## 2022-04-19 NOTE — Telephone Encounter (Signed)
Transitional Care call--James-cousin ? ? ? ?Are you/is patient experiencing any problems since coming home? No Are there any questions regarding any aspect of care? No ?Are there any questions regarding medications administration/dosing? What to do if pt runs out of medication and pt doesn't have a primary. Advised to get a primary but if run out call the pharmacy and request refill. Are meds being taken as prescribed? No Patient should review meds with caller to confirm ?Have there been any falls? No ?Has Home Health been to the house and/or have they contacted you? No If not, have you tried to contact them? I connected [facility] and they said they was having trouble connecting patient. I gave them [name] the cousin of patient phone number Can we help you contact them? ?Are bowels and bladder emptying properly? Are there any unexpected incontinence issues? If applicable, is patient following bowel/bladder programs? ?Any fevers, problems with breathing, unexpected pain? ?Are there any skin problems or new areas of breakdown? ?Has the patient/family member arranged specialty MD follow up (ie cardiology/neurology/renal/surgical/etc)?  Can we help arrange? ?Does the patient need any other services or support that we can help arrange? ?Are caregivers following through as expected in assisting the patient? ?Has the patient quit smoking, drinking alcohol, or using drugs as recommended? ? ?Appointment time 11:20 am, arrive time 11:00 am with Dr. Curlene Dolphin then Dr. Letta Pate ?[place] ?Call dropped when talking to pt cousin and I could not get him back on the phone. ?

## 2022-04-26 ENCOUNTER — Telehealth: Payer: Self-pay

## 2022-04-26 NOTE — Telephone Encounter (Signed)
Verbal okay given for in-home nursing services. To educate patient's disease process and medication management. ([facility] RN [phone]). ?

## 2022-04-28 ENCOUNTER — Encounter: Payer: Self-pay | Admitting: Physical Medicine & Rehabilitation

## 2022-04-28 ENCOUNTER — Telehealth (HOSPITAL_COMMUNITY): Payer: Self-pay | Admitting: Pharmacist

## 2022-04-28 ENCOUNTER — Other Ambulatory Visit (HOSPITAL_COMMUNITY): Payer: Self-pay

## 2022-04-28 ENCOUNTER — Telehealth: Payer: Self-pay

## 2022-04-28 ENCOUNTER — Encounter: Payer: Medicaid Other | Attending: Physical Medicine & Rehabilitation | Admitting: Physical Medicine & Rehabilitation

## 2022-04-28 VITALS — BP 116/75 | HR 84 | Ht 72.0 in | Wt 173.0 lb

## 2022-04-28 DIAGNOSIS — F1010 Alcohol abuse, uncomplicated: Secondary | ICD-10-CM | POA: Diagnosis not present

## 2022-04-28 DIAGNOSIS — I63512 Cerebral infarction due to unspecified occlusion or stenosis of left middle cerebral artery: Secondary | ICD-10-CM | POA: Insufficient documentation

## 2022-04-28 DIAGNOSIS — R1310 Dysphagia, unspecified: Secondary | ICD-10-CM | POA: Diagnosis not present

## 2022-04-28 DIAGNOSIS — Z720 Tobacco use: Secondary | ICD-10-CM

## 2022-04-28 NOTE — Telephone Encounter (Signed)
Unable to reach patient, will try again. ?

## 2022-04-28 NOTE — Telephone Encounter (Signed)
Todd Estrada, PT called to request extension on PT. Verbal orders given for 2 week 5. ?

## 2022-04-28 NOTE — Progress Notes (Signed)
Subjective ? ?Hospital HPI ?"Todd Estrada is a 61 y.o. male who presented to [facility] emergency department on 03/02/2022 with confusion, slurred speech and right-sided weakness.  Code stroke was initiated and neurology consulted.  Not tPA candidate secondary to unknown LK W.  CTA was concerning for occlusion of the left ICA and Dr. Dimas Alexandria was consulted.  He underwent embolectomy and balloon angioplasty and treated with dual antiplatelet medication.  He developed aspiration and required intubation mechanical ventilation.  MRI of the brain on 3/17 worrisome for acute left MCA territory infarcts.  History of alcohol abuse on CIWA protocol.  Extubated but required reintubation multiple times.  Eventually underwent tracheostomy on 3/22.  Unable to place core track and underwent bedside PEG placement on 3/24.  Maintained on Plavix and aspirin.  He was tolerating trach collar and trach tube changed to cuffless #6 on 3/30.   ?  ?  ?Todd Estrada was admitted to rehab 03/22/2022 for inpatient therapies to consist of PT, ST and OT at least three hours five days a week. Past admission physiatrist, therapy team and rehab RN have worked together to provide customized collaborative inpatient rehab.  Remained globally aphasic.  N.p.o. with continuous tube feeds.  Tracheostomy tube was partially dislodged on 4/5.  Respiratory therapy able to reproduce tube.  Pulmonology/critical care medicine reevaluated and will follow weekly.  Multiple superficial ulcers of bilateral toes noted.   ?  ?He underwent right lower extremity venous Doppler study to rule out DVT on 4/5.  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  Vascular surgery consultation obtained and ABIs performed.  Right ABI 0.51, left 0.41 with monophasic signals.  Continuous tube feeds adjusted to bolus feeds.  Patient tolerating well.  Keflex started on 4/9 for UTI>>completed on 4/15.  ?  ?Patient  noted to be lethargic on the morning of 4/10.  Requiring increased FiO2 from 35 to 60%.  Concern for hypercapnia.  Arterial blood gases and chest x-ray performed.  Pulmonology at bedside to reassess. Nothing acute on xray and normal pCo2. Likely mucous plug causing hypoxia.  ?Robinul discontinued as this was causing oral dryness and not affecting airway secretions. Pulmonologist rec: suctioning and saline. Tried removing wrist restraints but patient kept pulling and removing condom catheter.  ?  ?Hematuria noticed 4/13 and Lovenox discontinued. Serial CBCs performed. These remained stable and were discontinued. Urine now clear. Much improved mental status and able to discontinue wrist restraints on 4/14. Appetite and dysphagia improving. Diet advanced. ?Unable to cap trach due to mucous plugging. Routine trach change performed on 4/17. Wearing PMV. Tube feedings discontinued and patient taking 100% meals by mouth on 4/19. Nicoderm dosing titrated down. Tracheostomy decannulated by CCM on 4/24. Follow-up MBS completed on 4/26>>recommend fine chopped solids (dysphagia 2) and thin liquids, medications crushed with pureed food, full supervision. ?  ?Blood pressures were monitored on TID basis and relatively soft but remained stable." ? ?--------------------------------------------------------------------------------------- ?  ?61 year old male seen after hospitalization and rehabilitation for recent left MCA territory infarcts.  He is here a family member who reports that Todd Estrada has been doing overall well since his discharge from the hospital.  Peg tube was pulled out accidentally. Dr. Windle Guard rec keeping opening clean and dry.  His leg strength has improved. He is ambulating at home and this has been improving gradually.  He often does not want to use the walker when ambulation. Family walk with him  to prevent falls. Denies any bowel or bladder issues.  His swallowing has improved and he is tolerating a regular diet  with thin liquids.  He is working with [facility] and reports home PT will be starting next week.  He has a follow-up with his primary care physician on 5/22.  He denies any need for any medications at this time.  ? ? ? ? ?Pain Inventory ?Average Pain 0 ?Pain Right Now 0 ?My pain is  na ? ?LOCATION OF PAIN  na ? ?BOWEL ?Number of stools per week: 7 ?Oral laxative use No  ?Type of laxative . ?Enema or suppository use No  ?History of colostomy No  ?Incontinent No  ? ?BLADDER ?Normal ?In and out cath, frequency . ?Able to self cath  . ?Bladder incontinence No  ?Frequent urination No  ?Leakage with coughing No  ?Difficulty starting stream No  ?Incomplete bladder emptying No  ? ? ?Mobility ?walk without assistance ?how many minutes can you walk? 10 ? ?Function ?disabled: date disabled . ?I need assistance with the following:  dressing, bathing, household duties, and shopping ? ?Neuro/Psych ?trouble walking ? ?Prior Studies ?TC appt ? ?Physicians involved in your care ?TC appt ? ? ?No family history on file. ?Social History  ? ?Socioeconomic History  ? Marital status: Married  ?  Spouse name: Not on file  ? Number of children: Not on file  ? Years of education: Not on file  ? Highest education level: Not on file  ?Occupational History  ? Not on file  ?Tobacco Use  ? Smoking status: Every Day  ? Smokeless tobacco: Never  ?Vaping Use  ? Vaping Use: Never used  ?Substance and Sexual Activity  ? Alcohol use: Yes  ? Drug use: No  ? Sexual activity: Not on file  ?Other Topics Concern  ? Not on file  ?Social History Narrative  ? Not on file  ? ?Social Determinants of Health  ? ?Financial Resource Strain: Not on file  ?Food Insecurity: Not on file  ?Transportation Needs: Not on file  ?Physical Activity: Not on file  ?Stress: Not on file  ?Social Connections: Not on file  ? ?Past Surgical History:  ?Procedure Laterality Date  ? ESOPHAGOGASTRODUODENOSCOPY (EGD) WITH PROPOFOL N/A 03/11/2022  ? Procedure: ESOPHAGOGASTRODUODENOSCOPY (EGD)  WITH PROPOFOL;  Surgeon: Georganna Skeans, MD;  Location: [place];  Service: General;  Laterality: N/A;  ? IR ANGIO INTRA EXTRACRAN SEL COM CAROTID INNOMINATE UNI R MOD SED  03/02/2022  ? IR CT HEAD LTD  03/02/2022  ? IR PERCUTANEOUS ART THROMBECTOMY/INFUSION INTRACRANIAL INC DIAG ANGIO  03/02/2022  ? PEG PLACEMENT N/A 03/11/2022  ? Procedure: PERCUTANEOUS ENDOSCOPIC GASTROSTOMY (PEG) PLACEMENT;  Surgeon: Georganna Skeans, MD;  Location: [place];  Service: General;  Laterality: N/A;  ? RADIOLOGY WITH ANESTHESIA N/A 03/02/2022  ? Procedure: IR WITH ANESTHESIA;  Surgeon: Luanne Bras, MD;  Location: [place];  Service: Radiology;  Laterality: N/A;  ? ?Past Medical History:  ?Diagnosis Date  ? Hypertension   ? ?BP 116/75   Pulse 84   Ht 6' (1.829 m)   Wt 173 lb (78.5 kg)   SpO2 96%   BMI 23.46 kg/m?  ? ?Opioid Risk Score:   ?Fall Risk Score:  `1 ? ?Depression screen PHQ 2/9 ? ? ?  04/28/2022  ? 11:35 AM  ?Depression screen PHQ 2/9  ?Decreased Interest 0  ?Down, Depressed, Hopeless 1  ?PHQ - 2 Score 1  ?Altered sleeping 0  ?Tired, decreased energy 0  ?  Change in appetite 0  ?Feeling bad or failure about yourself  0  ?Trouble concentrating 0  ?Moving slowly or fidgety/restless 0  ?Suicidal thoughts 0  ?PHQ-9 Score 1  ?Difficult doing work/chores Not difficult at all  ? ?  ? ?Objective: ?Gen: no distress, normal appearing ?HEENT: oral mucosa pink and moist, NCAT, prior trach site healed ?Cardio: Reg rate ?Chest: normal effort, normal rate of breathing ?Abd: soft, non-distended.  Prior PEG site appears to be healing well no signs of infection ?Ext: no edema ?Psych: pleasant, normal affect ?Skin: intact ?Neuro: Cranial nerves II through XII intact with exception of mild right facial weakness.  Patient is alert.  He has significant expressive aphasia.  There also appears to be a more mild receptive aphasia. he is able to follow simple commands however has difficulty with more complex commands. Sensation intact in  all 4 extremities ?Musculoskeletal: No joint swelling or tenderness . appears to be 4+/5 in the left upper extremity and left lower extremity.  Right upper extremity and right lower extremity strength generally 4

## 2022-04-28 NOTE — Progress Notes (Signed)
?Office Note  ? ? ?HPI: Todd Estrada is a 61 y.o. (October 02, 1961) male presenting in follow up s/p hospitalization for stroke which left severe deficits requiring feeding tube placement and tracheostomy. At the time Todd Estrada had [place] 5 critical limb ischemia but was not a limb salvage candidate due to his dense post-stroke neuro deficits.  ? ?On exam today, Todd Estrada was doing well.  He remains nonverbal due to his stroke, however has had his tracheostomy removed as well as his feeding tube.  He is currently living at his nephew's home who is helping to take care of him.  Todd Estrada arrived in a wheelchair, however per his nephew, he walks around the house.  Wounds on bilateral feet have healed.   ? ?The pt is on a statin for cholesterol management.  ?The pt is  on a daily aspirin.   Other AC:  plavix ?The pt is  on medication for hypertension.   ?The pt is not diabetic.  ?Tobacco hx:  former ? ?Past Medical History:  ?Diagnosis Date  ? Hypertension   ? ? ?Past Surgical History:  ?Procedure Laterality Date  ? ESOPHAGOGASTRODUODENOSCOPY (EGD) WITH PROPOFOL N/A 03/11/2022  ? Procedure: ESOPHAGOGASTRODUODENOSCOPY (EGD) WITH PROPOFOL;  Surgeon: Georganna Skeans, MD;  Location: [place];  Service: General;  Laterality: N/A;  ? IR ANGIO INTRA EXTRACRAN SEL COM CAROTID INNOMINATE UNI R MOD SED  03/02/2022  ? IR CT HEAD LTD  03/02/2022  ? IR PERCUTANEOUS ART THROMBECTOMY/INFUSION INTRACRANIAL INC DIAG ANGIO  03/02/2022  ? PEG PLACEMENT N/A 03/11/2022  ? Procedure: PERCUTANEOUS ENDOSCOPIC GASTROSTOMY (PEG) PLACEMENT;  Surgeon: Georganna Skeans, MD;  Location: [place];  Service: General;  Laterality: N/A;  ? RADIOLOGY WITH ANESTHESIA N/A 03/02/2022  ? Procedure: IR WITH ANESTHESIA;  Surgeon: Luanne Bras, MD;  Location: [place];  Service: Radiology;  Laterality: N/A;  ? ? ?Social History  ? ?Socioeconomic History  ? Marital status: Married  ?  Spouse name: Not on file  ? Number of children: Not on file  ? Years of education: Not  on file  ? Highest education level: Not on file  ?Occupational History  ? Not on file  ?Tobacco Use  ? Smoking status: Every Day  ? Smokeless tobacco: Never  ?Vaping Use  ? Vaping Use: Never used  ?Substance and Sexual Activity  ? Alcohol use: Yes  ? Drug use: No  ? Sexual activity: Not on file  ?Other Topics Concern  ? Not on file  ?Social History Narrative  ? Not on file  ? ?Social Determinants of Health  ? ?Financial Resource Strain: Not on file  ?Food Insecurity: Not on file  ?Transportation Needs: Not on file  ?Physical Activity: Not on file  ?Stress: Not on file  ?Social Connections: Not on file  ?Intimate Partner Violence: Not on file  ? ?No family history on file. ? ?Current Outpatient Medications  ?Medication Sig Dispense Refill  ? acetaminophen (TYLENOL) 325 MG tablet Take 1-2 tablets (325-650 mg total) by mouth every 4 (four) hours as needed for mild pain.    ? aspirin 81 MG EC tablet Take 1 tablet (81 mg total) by mouth daily. Swallow whole. 30 tablet 0  ? atorvastatin (LIPITOR) 40 MG tablet Take 1 tablet (40 mg total) by mouth daily. 40 tablet 0  ? bacitracin ointment Apply topically 2 (two) times daily. To feeding tube site. 28.4 g 0  ? budesonide (PULMICORT) 0.5 MG/2ML nebulizer solution Take 2 mLs (0.5 mg total) by nebulization 2 (  two) times daily. 120 mL 0  ? clopidogrel (PLAVIX) 75 MG tablet Take 1 tablet (75 mg total) by mouth daily. 30 tablet 0  ? folic acid (FOLVITE) 1 MG tablet Take 1 tablet (1 mg total) by mouth daily. 30 tablet 0  ? guaiFENesin (ROBITUSSIN) 100 MG/5ML liquid Take 15 mLs by mouth every 6 (six) hours as needed for cough or to loosen phlegm. 120 mL 0  ? levETIRAcetam (KEPPRA) 500 MG tablet Take 1 tablet (500 mg total) by mouth 2 (two) times daily. 60 tablet 0  ? Multiple Vitamin (MULTIVITAMIN WITH MINERALS) TABS tablet Take 1 tablet by mouth daily. 30 tablet 0  ? pantoprazole (PROTONIX) 40 MG tablet Take 1 tablet (40 mg total) by mouth daily. 30 tablet 0  ? pantoprazole sodium  (PROTONIX) 40 mg Take 40 mg by mouth daily. 30 packet 0  ? thiamine 100 MG tablet Take 1 tablet (100 mg total) by mouth daily. 30 tablet 0  ? Tiotropium Bromide-Olodaterol (STIOLTO RESPIMAT) 2.5-2.5 MCG/ACT AERS Inhale 2 puffs into the lungs daily. 4 g 0  ? ?No current facility-administered medications for this visit.  ? ? ?No Known Allergies ? ? ?REVIEW OF SYSTEMS:  Unable to verbalize ?[X]  denotes positive finding, [ ]  denotes negative finding ?Cardiac  Comments:  ?Chest pain or chest pressure:    ?Shortness of breath upon exertion:    ?Short of breath when lying flat:    ?Irregular heart rhythm:    ?    ?Vascular    ?Pain in calf, thigh, or hip brought on by ambulation:    ?Pain in feet at night that wakes you up from your sleep:     ?Blood clot in your veins:    ?Leg swelling:     ?    ?Pulmonary    ?Oxygen at home:    ?Productive cough:     ?Wheezing:     ?    ?Neurologic    ?Sudden weakness in arms or legs:     ?Sudden numbness in arms or legs:     ?Sudden onset of difficulty speaking or slurred speech:    ?Temporary loss of vision in one eye:     ?Problems with dizziness:     ?    ?Gastrointestinal    ?Blood in stool:     ?Vomited blood:     ?    ?Genitourinary    ?Burning when urinating:     ?Blood in urine:    ?    ?Psychiatric    ?Major depression:     ?    ?Hematologic    ?Bleeding problems:    ?Problems with blood clotting too easily:    ?    ?Skin    ?Rashes or ulcers:    ?    ?Constitutional    ?Fever or chills:    ? ? ?PHYSICAL EXAMINATION: ? ?There were no vitals filed for this visit. ? ?General:  WDWN in NAD; vital signs documented above ?Gait: Not observed ?HENT: WNL, normocephalic ?Pulmonary: normal non-labored breathing , without wheezing ?Cardiac: regular HR ?Abdomen: soft, NT, no masses ?Skin: without rashes ?Vascular Exam/Pulses: ? Right Left  ?Radial 2+ (normal) 2+ (normal)  ?Ulnar    ?Femoral    ?Popliteal    ?DP nonpalpable nonpalpable  ?PT nonpalpable nonpalpable  ? ?Extremities: without  ischemic changes, without Gangrene , without cellulitis; without open wounds;  ?Musculoskeletal: no muscle wasting or atrophy  ?Neurologic: A&O X 3;  No focal  weakness or paresthesias are detected ?Psychiatric:  The pt has Normal affect. ? ? ?Non-Invasive Vascular Imaging:   ?+-------+-----------+-----------+------------+------------+  ?ABI/TBIToday's ABIToday's TBIPrevious ABIPrevious TBI  ?+-------+-----------+-----------+------------+------------+  ?Right  0.51                                            ?+-------+-----------+-----------+------------+------------+  ?Left   0.41                                            ?+-------+-----------+-----------+------------+------------+  ? ? ? ?ASSESSMENT/PLAN: Todd Estrada is a 61 y.o. male presenting in follow-up status post stroke requiring percutaneous thrombectomy with Dr. Estanislado Pandy. Vascular surgery was consulted for with bilateral lower extremity wounds.  ABIs at that time demonstrated peripheral arterial disease.  Patient was not a candidate for limb salvage due to the dense deficits his stroke have caused. ? ?On exam today, Todd Estrada is doing much better.  He is ambulating, and his tracheostomy as well as PEG tube have been removed. ? ?Fortunately, Todd Estrada's bilateral foot wounds have healed.  We had a long discussion about the importance of foot care, especially in the setting of his known peripheral arterial disease.  I will refer him to a podiatrist for toenail management.I asked that he continue his current medication regimen, and that I would see him in 1 year for follow-up.   I asked his cousin to to call should any questions or concerns arise or nonhealing foot wounds about. I will defer carotid management to Dr. Estanislado Pandy as he performed Shiva's previous intervention. ? ? ? ?Todd John, MD ?[facility] ?[phone] ? ?

## 2022-04-29 ENCOUNTER — Encounter: Payer: Self-pay | Admitting: Vascular Surgery

## 2022-04-29 ENCOUNTER — Ambulatory Visit (INDEPENDENT_AMBULATORY_CARE_PROVIDER_SITE_OTHER): Payer: Medicaid Other | Admitting: Vascular Surgery

## 2022-04-29 VITALS — BP 108/67 | HR 88 | Temp 98.4°F | Resp 14 | Ht 73.0 in | Wt 173.3 lb

## 2022-04-29 DIAGNOSIS — I739 Peripheral vascular disease, unspecified: Secondary | ICD-10-CM

## 2022-05-04 ENCOUNTER — Ambulatory Visit (INDEPENDENT_AMBULATORY_CARE_PROVIDER_SITE_OTHER): Payer: Medicaid Other | Admitting: Pulmonary Disease

## 2022-05-04 ENCOUNTER — Telehealth: Payer: Self-pay | Admitting: *Deleted

## 2022-05-04 ENCOUNTER — Encounter: Payer: Self-pay | Admitting: Pulmonary Disease

## 2022-05-04 VITALS — BP 126/68 | HR 81 | Ht 73.0 in | Wt 177.2 lb

## 2022-05-04 DIAGNOSIS — R918 Other nonspecific abnormal finding of lung field: Secondary | ICD-10-CM

## 2022-05-04 DIAGNOSIS — J432 Centrilobular emphysema: Secondary | ICD-10-CM | POA: Diagnosis not present

## 2022-05-04 MED ORDER — STIOLTO RESPIMAT 2.5-2.5 MCG/ACT IN AERS: 2.0000 | INHALATION_SPRAY | Freq: Every day | RESPIRATORY_TRACT | 0 refills | Status: DC

## 2022-05-04 MED ORDER — BUDESONIDE 0.5 MG/2ML IN SUSP: 0.5000 mg | Freq: Two times a day (BID) | RESPIRATORY_TRACT | 0 refills | Status: DC

## 2022-05-04 NOTE — Patient Instructions (Addendum)
We will schedule you for a PET scan to further evaluate the lung masses on your previous CT chest scan. ? ?Continue stiolto 2 puffs daily ? ?Continue budesonide nebulizer treatment twice daily ? ?Use albuterol as needed ? ?We will call you with PET scan results and discuss bronchosocpy if needed at that time. ? ?Will schedule follow up visits based on PET scan results ?

## 2022-05-04 NOTE — Progress Notes (Signed)
Synopsis: Referred in May 2023 for hospital follow up   Subjective:   PATIENT ID: Todd Estrada GENDER: male DOB: 03/15/1961, MRN: [id number]   HPI  Chief Complaint  Patient presents with   Consult    HFU post trach. Has had congestion since April 2023. Currently on budesonide nebs and Stiolto but has been out of medications for 3 days.    Todd Estrada is a 61 year old male, recent former smoker with history of hypertension and CVA who is referred to pulmonary clinic for emphysema.   He has been using stiolto and budesonide since discharge and reports his breathing has been fine. 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   He had tracheostomy placed on 3/21 during his admission for difficulty weaning off the ventilator along with multiple intubations.  He was decannulated 4/24 while in rehab.  He has quit smoking since his admission.  He was smoking 1 to 1.5 packs for 50 years.  CT chest scan 03/18/2022 showed moderate to severe emphysema.  There is 2.7 x 3.7 cm lobulated spiculated mass within the left upper lobe.  There is a 16 x 20 mm spiculated nodule within the posterior segment of the right upper lobe.  He denies any hemoptysis or weight loss.  His father had pancreatic cancer.  His uncle had stage IV lung cancer.  He has a an aunt with breast cancer.   Past Medical History:  Diagnosis Date   Hypertension      No family history on file.   Social History   Socioeconomic History   Marital status: Married    Spouse name: Not on file   Number of children: Not on file   Years of education: Not on file   Highest education level: Not on file  Occupational History   Not on file  Tobacco Use   Smoking status: Former    Types: Cigarettes    Passive exposure: Never   Smokeless tobacco: Never  Vaping Use   Vaping Use: Never used  Substance and Sexual Activity   Alcohol use: Yes   Drug use: No   Sexual activity:  Not on file  Other Topics Concern   Not on file  Social History Narrative   Not on file   Social Determinants of Health   Financial Resource Strain: Not on file  Food Insecurity: Not on file  Transportation Needs: Not on file  Physical Activity: Not on file  Stress: Not on file  Social Connections: Not on file  Intimate Partner Violence: Not on file     No Known Allergies   Outpatient Medications Prior to Visit  Medication Sig Dispense Refill   acetaminophen (TYLENOL) 325 MG tablet Take 1-2 tablets (325-650 mg total) by mouth every 4 (four) hours as needed for mild pain.     aspirin 81 MG EC tablet Take 1 tablet (81 mg total) by mouth daily. Swallow whole. 30 tablet 0   atorvastatin (LIPITOR) 40 MG tablet Take 1 tablet (40 mg total) by mouth daily. 40 tablet 0   bacitracin ointment Apply topically 2 (two) times daily. To feeding tube site. 28.4 g 0   clopidogrel (PLAVIX) 75 MG tablet Take 1 tablet (75 mg total) by mouth daily. 30 tablet 0   folic acid (FOLVITE) 1 MG tablet Take 1 tablet (1 mg total) by mouth daily. 30 tablet 0   guaiFENesin (ROBITUSSIN) 100 MG/5ML liquid Take 15  mLs by mouth every 6 (six) hours as needed for cough or to loosen phlegm. 120 mL 0   levETIRAcetam (KEPPRA) 500 MG tablet Take 1 tablet (500 mg total) by mouth 2 (two) times daily. 60 tablet 0   Multiple Vitamin (MULTIVITAMIN WITH MINERALS) TABS tablet Take 1 tablet by mouth daily. 30 tablet 0   pantoprazole (PROTONIX) 40 MG tablet Take 1 tablet (40 mg total) by mouth daily. 30 tablet 0   pantoprazole sodium (PROTONIX) 40 mg Take 40 mg by mouth daily. 30 packet 0   thiamine 100 MG tablet Take 1 tablet (100 mg total) by mouth daily. 30 tablet 0   budesonide (PULMICORT) 0.5 MG/2ML nebulizer solution Take 2 mLs (0.5 mg total) by nebulization 2 (two) times daily. 120 mL 0   Tiotropium Bromide-Olodaterol (STIOLTO RESPIMAT) 2.5-2.5 MCG/ACT AERS Inhale 2 puffs into the lungs daily. 4 g 0   No  facility-administered medications prior to visit.   Review of Systems  Constitutional:  Negative for chills, fever, malaise/fatigue and weight loss.  HENT:  Negative for congestion, sinus pain and sore throat.   Eyes: Negative.   Respiratory:  Positive for shortness of breath. Negative for cough, hemoptysis, sputum production and wheezing.   Cardiovascular:  Negative for chest pain, palpitations, orthopnea, claudication and leg swelling.  Gastrointestinal:  Negative for abdominal pain, heartburn, nausea and vomiting.  Genitourinary: Negative.   Musculoskeletal:  Negative for joint pain and myalgias.  Skin:  Negative for rash.  Neurological:  Positive for speech change and focal weakness. Negative for weakness.  Endo/Heme/Allergies: Negative.   Psychiatric/Behavioral: Negative.     Objective:   Vitals:   05/04/22 1505  BP: 126/68  Pulse: 81  SpO2: 97%  Weight: 177 lb 3.2 oz (80.4 kg)  Height: 6\' 1"  (1.854 m)     Physical Exam Constitutional:      General: He is not in acute distress. HENT:     Head: Normocephalic and atraumatic.  Eyes:     Extraocular Movements: Extraocular movements intact.     Conjunctiva/sclera: Conjunctivae normal.     Pupils: Pupils are equal, round, and reactive to light.  Cardiovascular:     Rate and Rhythm: Normal rate and regular rhythm.     Pulses: Normal pulses.     Heart sounds: Normal heart sounds. No murmur heard. Pulmonary:     Effort: Pulmonary effort is normal.     Breath sounds: No wheezing, rhonchi or rales.  Abdominal:     General: Bowel sounds are normal.     Palpations: Abdomen is soft.  Musculoskeletal:     Right lower leg: No edema.     Left lower leg: No edema.  Lymphadenopathy:     Cervical: No cervical adenopathy.  Skin:    General: Skin is warm and dry.  Neurological:     General: No focal deficit present.     Mental Status: He is alert.  Psychiatric:        Mood and Affect: Mood normal.        Behavior: Behavior  normal.        Thought Content: Thought content normal.        Judgment: Judgment normal.   CBC    Component Value Date/Time   WBC 9.2 04/05/2022 0531   RBC 4.67 04/05/2022 0531   HGB 14.3 04/05/2022 0531   HCT 42.6 04/05/2022 0531   PLT 229 04/05/2022 0531   MCV 91.2 04/05/2022 0531   MCH 30.6 04/05/2022 0531  MCHC 33.6 04/05/2022 0531   RDW 13.6 04/05/2022 0531   LYMPHSABS 2.3 04/05/2022 0531   MONOABS 0.9 04/05/2022 0531   EOSABS 0.4 04/05/2022 0531   BASOSABS 0.1 04/05/2022 0531      Latest Ref Rng & Units 04/11/2022    6:34 AM 04/04/2022    5:28 AM 03/28/2022    7:53 AM  BMP  Glucose 70 - 99 mg/dL 102   102   114    BUN 8 - 23 mg/dL 20   23   27     Creatinine 0.61 - 1.24 mg/dL 0.90   0.81   0.85    Sodium 135 - 145 mmol/L 137   139   135    Potassium 3.5 - 5.1 mmol/L 4.2   4.5   4.8    Chloride 98 - 111 mmol/L 100   103   98    CO2 22 - 32 mmol/L 30   28   29     Calcium 8.9 - 10.3 mg/dL 9.3   9.3   9.0     Chest imaging: CT Chest w contrast 03/18/22 Bilateral upper lobe lobulated/spiculated masses suspicious for synchronous bronchogenic neoplasm. PET CT examination is recommended for further evaluation. Associated left hilar pathologic adenopathy.   13 mm indeterminate rounded nodule within the left lower lobe. Further follow-up will be dictated by surveillance imaging for above-mentioned pulmonary masses.   Moderate to severe emphysema.   Extensive coronary artery calcification.  PFT:     View : No data to display.          Labs:  Path:  Echo:  Heart Catheterization:  Assessment & Plan:   Centrilobular emphysema ([place]) - Plan: Tiotropium Bromide-Olodaterol (STIOLTO RESPIMAT) 2.5-2.5 MCG/ACT AERS, budesonide (PULMICORT) 0.5 MG/2ML nebulizer solution  Lung mass - Plan: NM PET Image Initial (PI) Skull Base To Thigh  Discussion: Todd Estrada is a 61 year old male, recent former smoker with history of hypertension and CVA who is referred to  pulmonary clinic for emphysema.   He is to continue Stiolto 2 puffs daily and budesonide nebulizer treatments twice daily.  We discussed the CT scan findings from March 2023 and the concern for lung cancer given his significant smoking history.  I have ordered a PET scan for further evaluation and he will likely need navigational bronchoscopy.  The procedure along with the risks/benefits were explained to the patient and his cousin.  They expressed understanding and I told them we would likely need to arrange this procedure once we have the PET scan results back.  We will schedule follow-up accordingly to the PET scan results.  Freda Jackson, MD [facility] Office: [phone]   Current Outpatient Medications:    acetaminophen (TYLENOL) 325 MG tablet, Take 1-2 tablets (325-650 mg total) by mouth every 4 (four) hours as needed for mild pain., Disp: , Rfl:    aspirin 81 MG EC tablet, Take 1 tablet (81 mg total) by mouth daily. Swallow whole., Disp: 30 tablet, Rfl: 0   atorvastatin (LIPITOR) 40 MG tablet, Take 1 tablet (40 mg total) by mouth daily., Disp: 40 tablet, Rfl: 0   bacitracin ointment, Apply topically 2 (two) times daily. To feeding tube site., Disp: 28.4 g, Rfl: 0   clopidogrel (PLAVIX) 75 MG tablet, Take 1 tablet (75 mg total) by mouth daily., Disp: 30 tablet, Rfl: 0   folic acid (FOLVITE) 1 MG tablet, Take 1 tablet (1 mg total) by mouth daily., Disp: 30 tablet,  Rfl: 0   guaiFENesin (ROBITUSSIN) 100 MG/5ML liquid, Take 15 mLs by mouth every 6 (six) hours as needed for cough or to loosen phlegm., Disp: 120 mL, Rfl: 0   levETIRAcetam (KEPPRA) 500 MG tablet, Take 1 tablet (500 mg total) by mouth 2 (two) times daily., Disp: 60 tablet, Rfl: 0   Multiple Vitamin (MULTIVITAMIN WITH MINERALS) TABS tablet, Take 1 tablet by mouth daily., Disp: 30 tablet, Rfl: 0   pantoprazole (PROTONIX) 40 MG tablet, Take 1 tablet (40 mg total) by mouth daily., Disp: 30 tablet, Rfl: 0    pantoprazole sodium (PROTONIX) 40 mg, Take 40 mg by mouth daily., Disp: 30 packet, Rfl: 0   thiamine 100 MG tablet, Take 1 tablet (100 mg total) by mouth daily., Disp: 30 tablet, Rfl: 0   Tiotropium Bromide-Olodaterol (STIOLTO RESPIMAT) 2.5-2.5 MCG/ACT AERS, Inhale 2 puffs into the lungs daily., Disp: 4 g, Rfl: 0   budesonide (PULMICORT) 0.5 MG/2ML nebulizer solution, Take 2 mLs (0.5 mg total) by nebulization 2 (two) times daily., Disp: 120 mL, Rfl: 0   Tiotropium Bromide-Olodaterol (STIOLTO RESPIMAT) 2.5-2.5 MCG/ACT AERS, Inhale 2 puffs into the lungs daily., Disp: 4 g, Rfl: 0
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
## 2022-05-05 ENCOUNTER — Encounter: Payer: Self-pay | Admitting: Pulmonary Disease

## 2022-05-05 NOTE — Telephone Encounter (Signed)
I let Deidre know the PCP should handle placement if needed. She said they have set him up with PCP at [facility] and is for next week.

## 2022-05-06 ENCOUNTER — Other Ambulatory Visit: Payer: Self-pay

## 2022-05-06 ENCOUNTER — Encounter (HOSPITAL_COMMUNITY): Payer: Self-pay

## 2022-05-06 ENCOUNTER — Emergency Department (HOSPITAL_COMMUNITY): Payer: Medicaid Other

## 2022-05-06 ENCOUNTER — Emergency Department (HOSPITAL_COMMUNITY)
Admission: EM | Admit: 2022-05-06 | Discharge: 2022-05-06 | Disposition: A | Discharge status: Home / Self Care | Payer: Medicaid Other | Attending: Emergency Medicine | Admitting: Emergency Medicine

## 2022-05-06 DIAGNOSIS — I69320 Aphasia following cerebral infarction: Secondary | ICD-10-CM

## 2022-05-06 DIAGNOSIS — R109 Unspecified abdominal pain: Secondary | ICD-10-CM | POA: Insufficient documentation

## 2022-05-06 DIAGNOSIS — Z20822 Contact with and (suspected) exposure to covid-19: Secondary | ICD-10-CM | POA: Diagnosis not present

## 2022-05-06 DIAGNOSIS — R509 Fever, unspecified: Secondary | ICD-10-CM | POA: Diagnosis not present

## 2022-05-06 DIAGNOSIS — R112 Nausea with vomiting, unspecified: Secondary | ICD-10-CM | POA: Diagnosis not present

## 2022-05-06 DIAGNOSIS — D72829 Elevated white blood cell count, unspecified: Secondary | ICD-10-CM | POA: Diagnosis not present

## 2022-05-06 DIAGNOSIS — K8020 Calculus of gallbladder without cholecystitis without obstruction: Secondary | ICD-10-CM

## 2022-05-06 DIAGNOSIS — N3001 Acute cystitis with hematuria: Secondary | ICD-10-CM

## 2022-05-06 DIAGNOSIS — R918 Other nonspecific abnormal finding of lung field: Secondary | ICD-10-CM

## 2022-05-06 LAB — CBC WITH DIFFERENTIAL/PLATELET
Abs Immature Granulocytes: 0.06 10*3/uL (ref 0.00–0.07)
Basophils Absolute: 0 10*3/uL (ref 0.0–0.1)
Basophils Relative: 0 %
Eosinophils Absolute: 0.1 10*3/uL (ref 0.0–0.5)
Eosinophils Relative: 0 %
HCT: 43.4 % (ref 39.0–52.0)
Hemoglobin: 14 g/dL (ref 13.0–17.0)
Immature Granulocytes: 0 %
Lymphocytes Relative: 5 %
Lymphs Abs: 0.8 10*3/uL (ref 0.7–4.0)
MCH: 30 pg (ref 26.0–34.0)
MCHC: 32.3 g/dL (ref 30.0–36.0)
MCV: 92.9 fL (ref 80.0–100.0)
Monocytes Absolute: 0.9 10*3/uL (ref 0.1–1.0)
Monocytes Relative: 6 %
Neutro Abs: 14.1 10*3/uL — ABNORMAL HIGH (ref 1.7–7.7)
Neutrophils Relative %: 89 %
Platelets: 265 10*3/uL (ref 150–400)
RBC: 4.67 MIL/uL (ref 4.22–5.81)
RDW: 13.6 % (ref 11.5–15.5)
WBC: 16 10*3/uL — ABNORMAL HIGH (ref 4.0–10.5)
nRBC: 0 % (ref 0.0–0.2)

## 2022-05-06 LAB — URINALYSIS, ROUTINE W REFLEX MICROSCOPIC
Bilirubin Urine: NEGATIVE
Glucose, UA: NEGATIVE mg/dL
Ketones, ur: NEGATIVE mg/dL
Nitrite: NEGATIVE
Protein, ur: 30 mg/dL — AB
Specific Gravity, Urine: 1.028 (ref 1.005–1.030)
WBC, UA: >50 WBC/hpf — ABNORMAL HIGH (ref 0–5)
pH: 5 (ref 5.0–8.0)

## 2022-05-06 LAB — LIPASE, BLOOD: Lipase: 26 U/L (ref 11–51)

## 2022-05-06 LAB — COMPREHENSIVE METABOLIC PANEL
ALT: 13 U/L (ref 0–44)
AST: 19 U/L (ref 15–41)
Albumin: 3.8 g/dL (ref 3.5–5.0)
Alkaline Phosphatase: 77 U/L (ref 38–126)
Anion gap: 8 (ref 5–15)
BUN: 10 mg/dL (ref 8–23)
CO2: 25 mmol/L (ref 22–32)
Calcium: 9 mg/dL (ref 8.9–10.3)
Chloride: 104 mmol/L (ref 98–111)
Creatinine, Ser: 0.79 mg/dL (ref 0.61–1.24)
GFR, Estimated: >60 mL/min (ref >60)
Glucose, Bld: 143 mg/dL — ABNORMAL HIGH (ref 70–99)
Potassium: 3.6 mmol/L (ref 3.5–5.1)
Sodium: 137 mmol/L (ref 135–145)
Total Bilirubin: 0.8 mg/dL (ref 0.3–1.2)
Total Protein: 7.8 g/dL (ref 6.5–8.1)

## 2022-05-06 LAB — RESP PANEL BY RT-PCR (FLU A&B, COVID) ARPGX2
Influenza A by PCR: NEGATIVE
Influenza B by PCR: NEGATIVE
SARS Coronavirus 2 by RT PCR: NEGATIVE

## 2022-05-06 LAB — LACTIC ACID, PLASMA: Lactic Acid, Venous: 1.5 mmol/L (ref 0.5–1.9)

## 2022-05-06 IMAGING — US US ABDOMEN LIMITED
1 series · 15 of 25 positions shown · non-contrast
Comparison: Same-day CT.

CLINICAL DATA: Abdominal pain.

EXAM:
ULTRASOUND ABDOMEN LIMITED RIGHT UPPER QUADRANT

[Series 1: us abdomen limited ruq mc & wl · 15 of 42 slices shown]
[im 1/42]
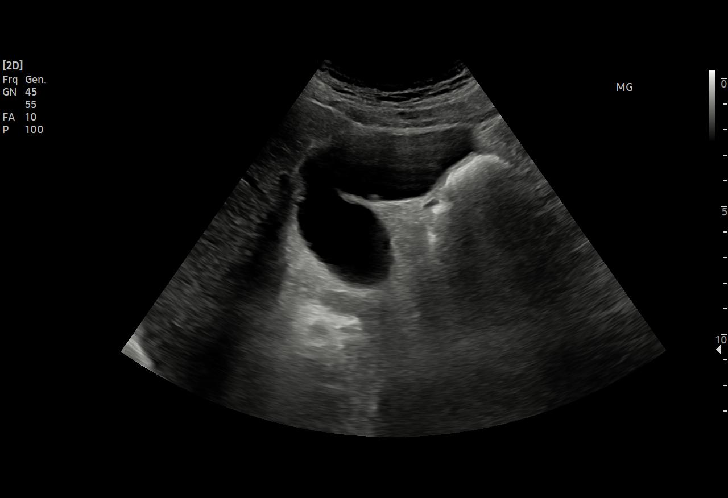
[im 4/42]
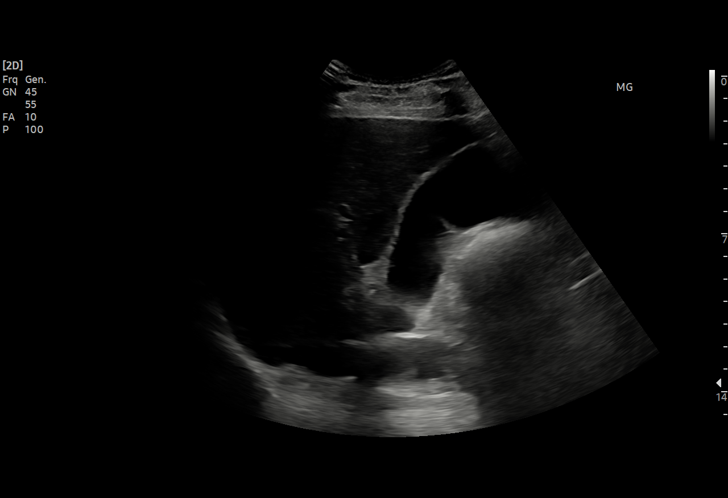
[im 7/42]
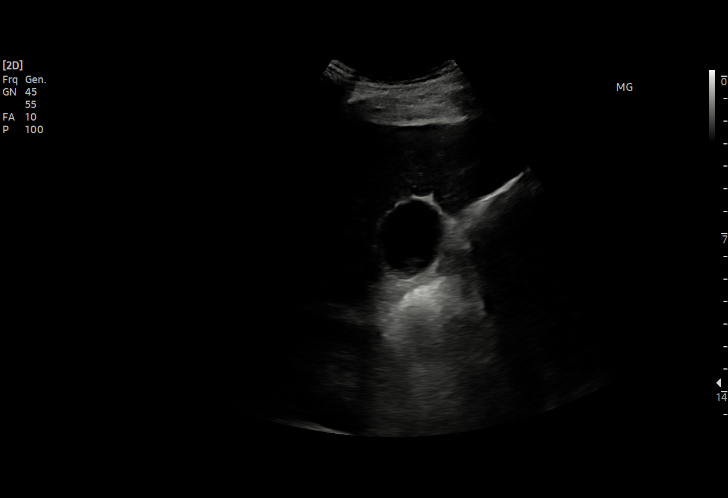
[im 9/42]
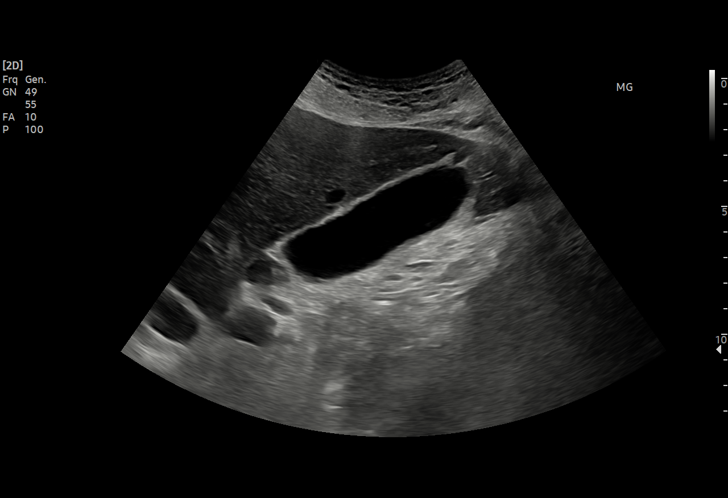
[im 12/42]
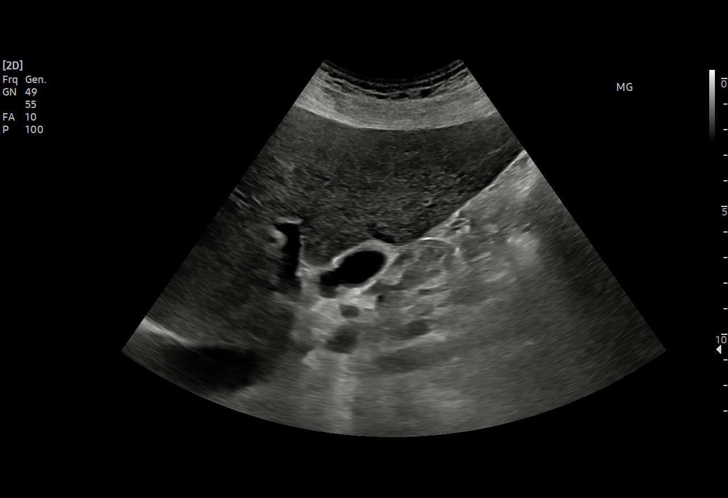
[im 16/42]
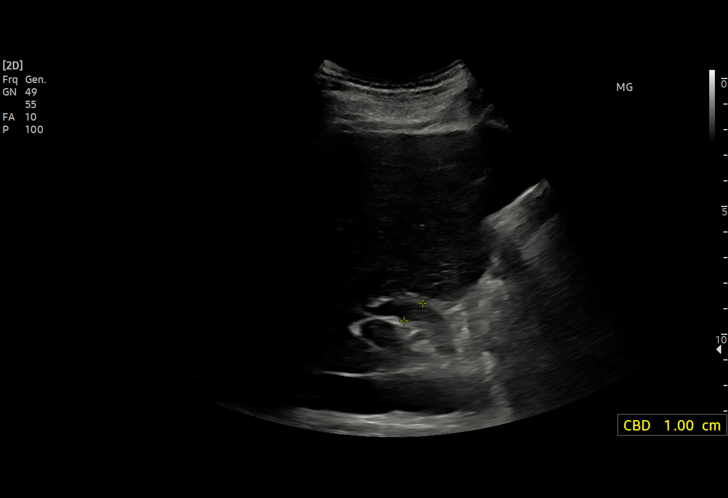
[im 18/42]
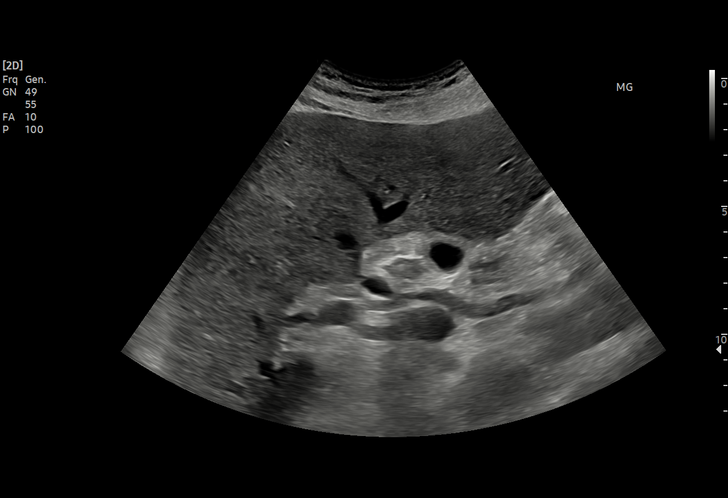
[im 21/42]
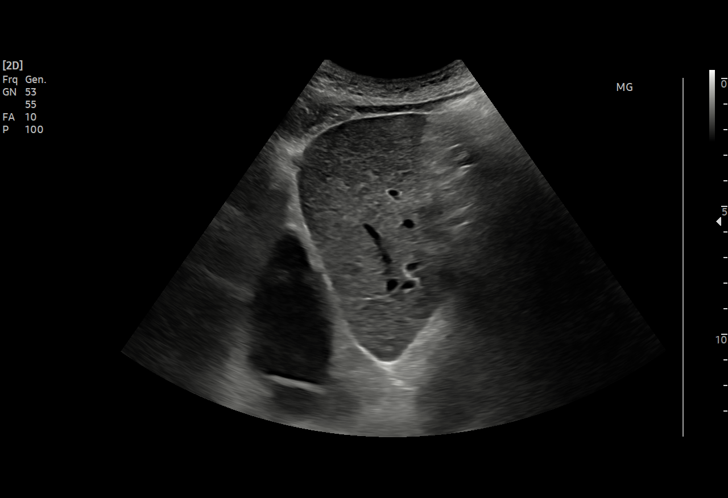
[im 24/42]
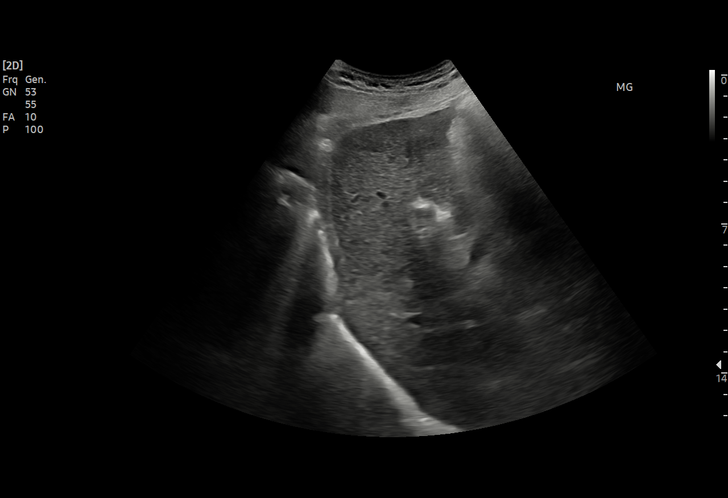
[im 26/42]
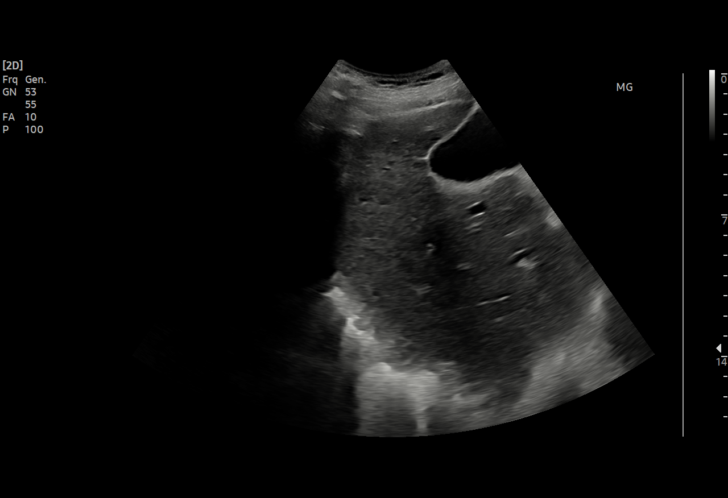
[im 30/42]
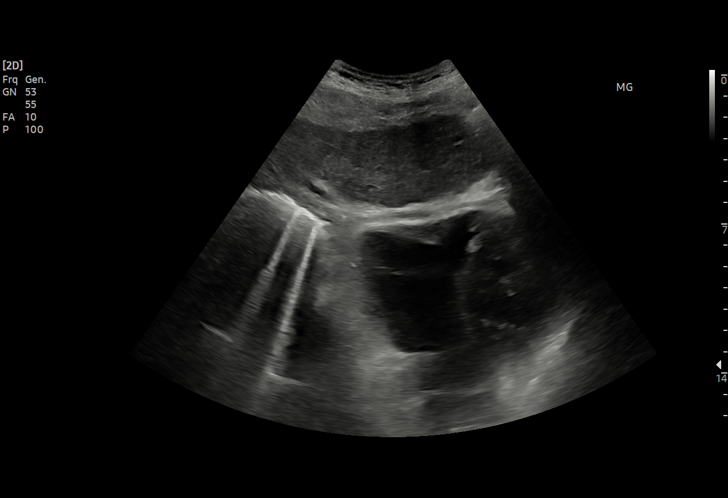
[im 33/42]
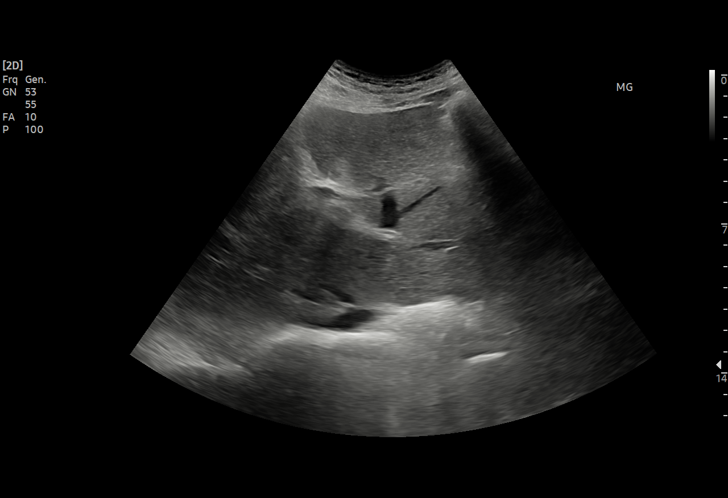
[im 35/42]
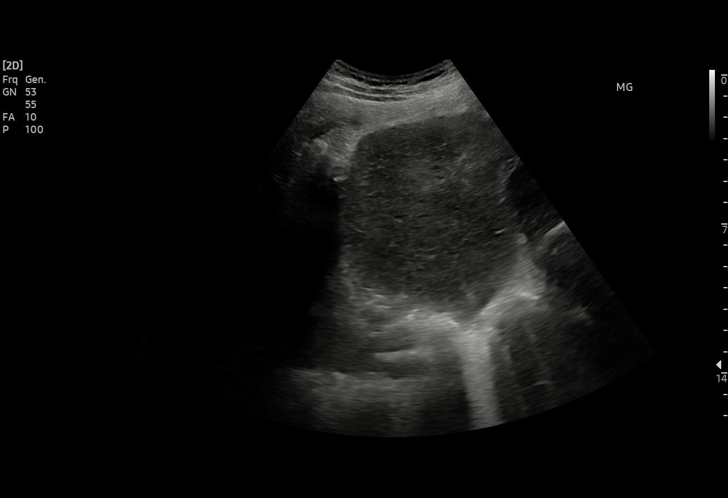
[im 38/42]
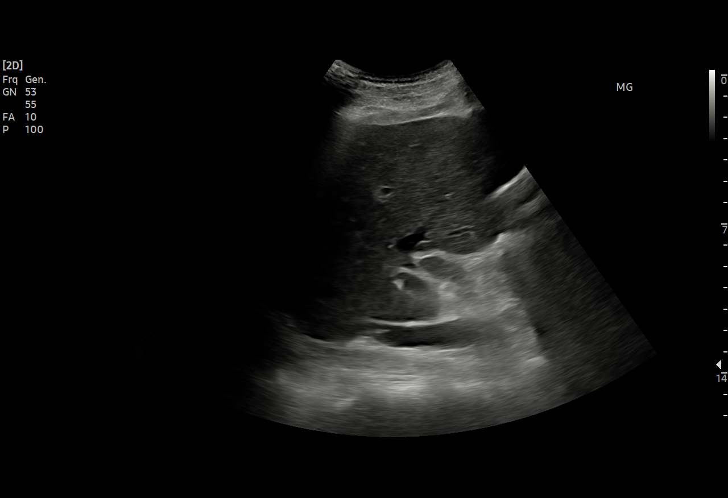
[im 42/42]
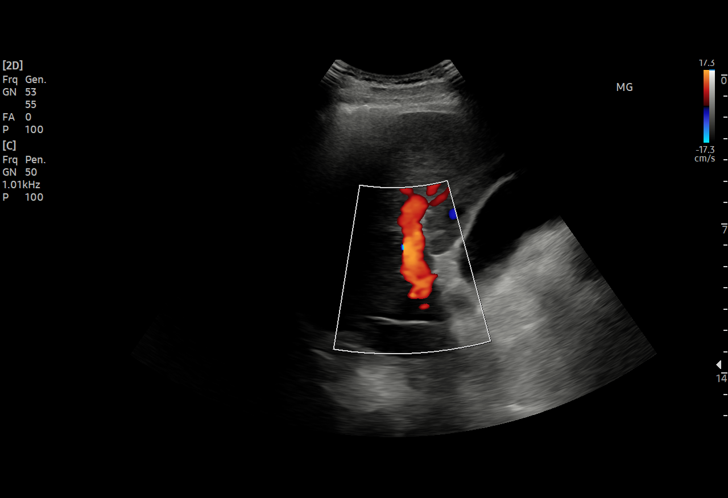

[15 of 25 positions shown; findings below may reference images not displayed]

FINDINGS: Gallbladder:

There are least 2 mobile stones in the gallbladder, largest is
cm. There is no free wall thickening. There is no positive
sonographic Murphy's sign. No pericholecystic fluid is seen. There
is gallbladder distention.

Common bile duct:

Diameter: Mildly prominent measuring 7.4 mm. No intrahepatic biliary
prominence.

Liver:

No focal lesion identified. There is mild increased hepatic
echogenicity of steatosis. Portal vein is patent on color Doppler
imaging with normal direction of blood flow towards the liver.

Other: None.
IMPRESSION: 1. Cholelithiasis and gallbladder distention, without sonographic
findings of acute cholecystitis.
2. Hepatic steatosis. No focal lesion is seen through the steatosis.

## 2022-05-06 IMAGING — DX DG CHEST 1V PORT
1 series · 1 of 1 positions shown · non-contrast
Comparison: Chest x-ray [DATE].

CLINICAL DATA: 60-year-old male with signs and symptoms concerning
for potential aspiration.

EXAM:
PORTABLE CHEST 1 VIEW

[chest ap]
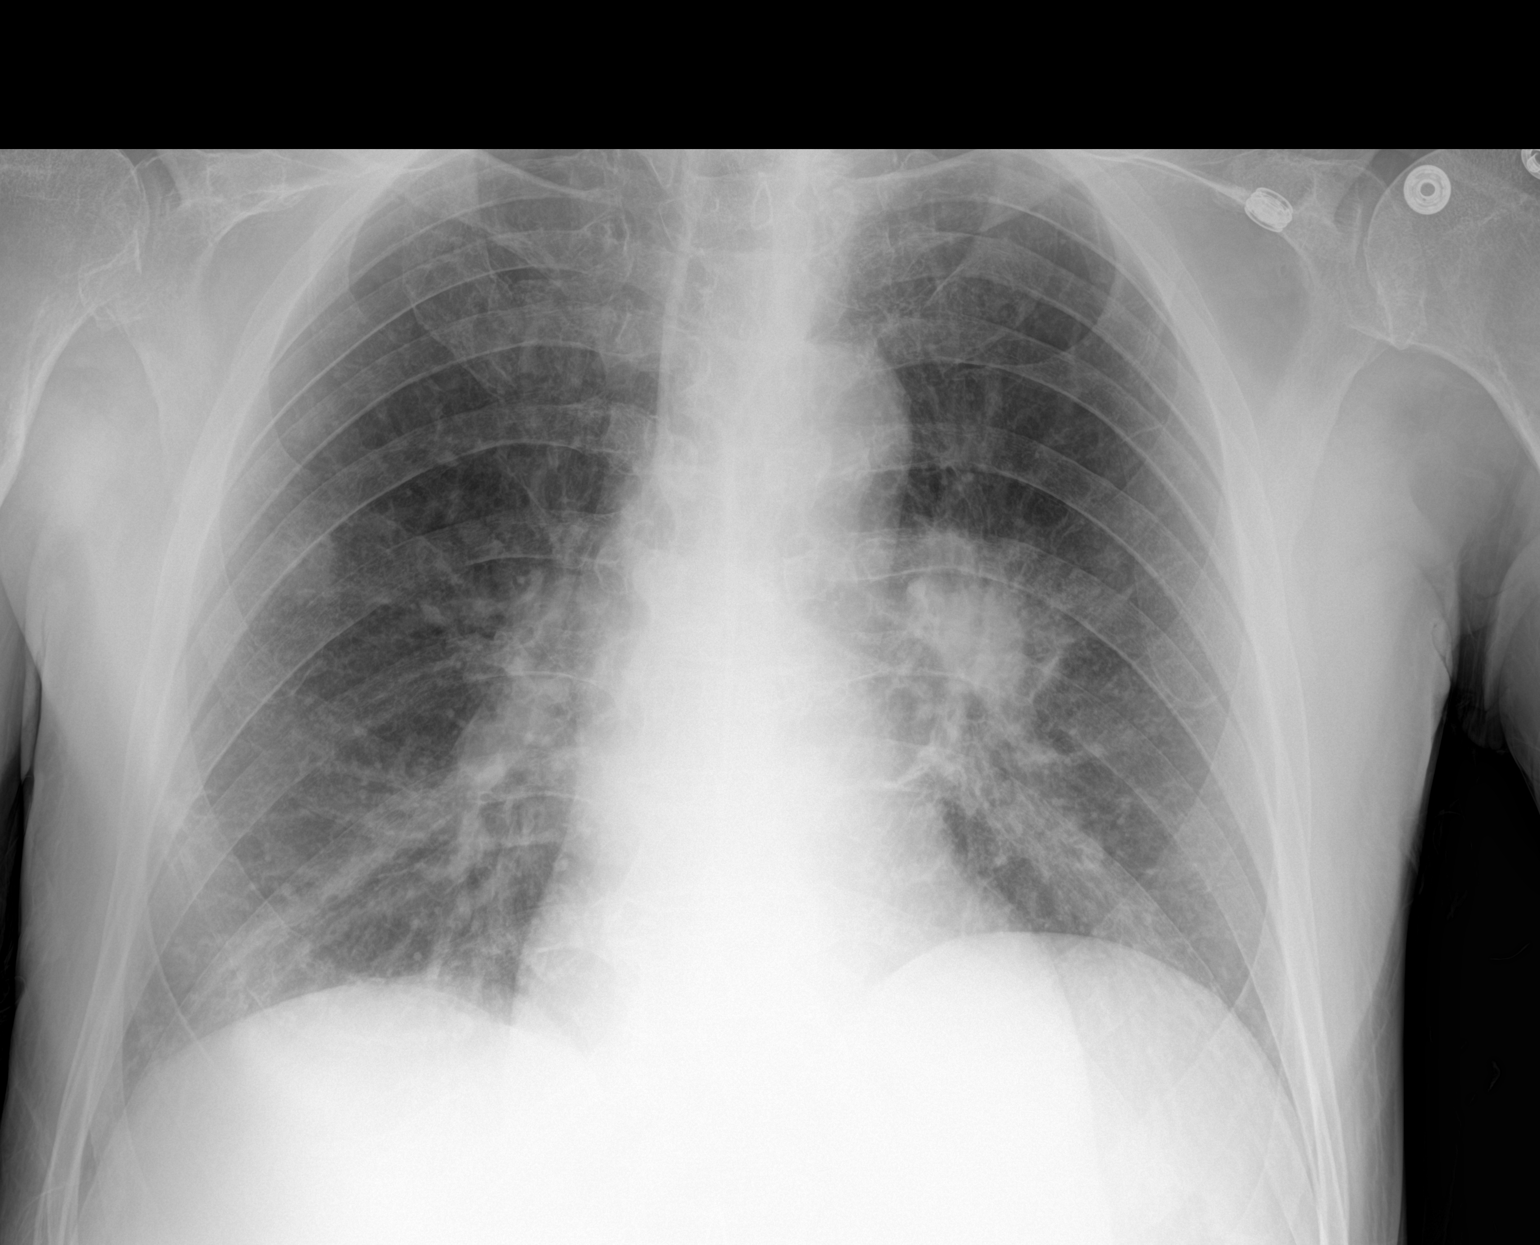

[1 of 1 positions shown; findings below may reference images not displayed]

FINDINGS: Mass-like fullness of the left hilum increased compared to the prior
examination. Subtle ill-defined nodular density projecting over the
inferior aspect of the right upper lobe also noted. There are some
bibasilar opacities which may reflect areas of atelectasis and/or
consolidation (left-greater-than-right). No pleural effusions. No
pneumothorax. No evidence of pulmonary edema. Heart size is upper
limits of normal. Upper mediastinal contours are within normal
limits. Atherosclerotic calcifications are noted in the thoracic
aorta.
IMPRESSION: 1. Bibasilar opacities may reflect areas of atelectasis and/or
consolidation.
2. Increasing mass-like fullness in the left hilar region and
persistent ill-defined opacity in the inferior aspect of the right
upper lobe. These findings are highly concerning for progressive
neoplasm (please see prior chest CT [DATE] for full description
of suspicious lung lesions bilaterally).
3. Aortic atherosclerosis.

## 2022-05-06 IMAGING — CT CT ABD-PELV W/ CM
2 of 5 series · 14 of 46 positions shown, 16 images · IV contrast (APPLIED)
Comparison: Chest CT [DATE].  No prior abdomen and pelvis.

CLINICAL DATA: Abdominal pain and leukocytosis.

EXAM:
CT ABDOMEN AND PELVIS WITH CONTRAST
TECHNIQUE: Multidetector CT imaging of the abdomen and pelvis was performed
using the standard protocol following bolus administration of
intravenous contrast.

[Series 2: axial st · axial · 0.78mm/px · z∈[+976,+1391]mm · 11 of 95 slices shown, 13 images]
[im 6/95  soft-tissue]
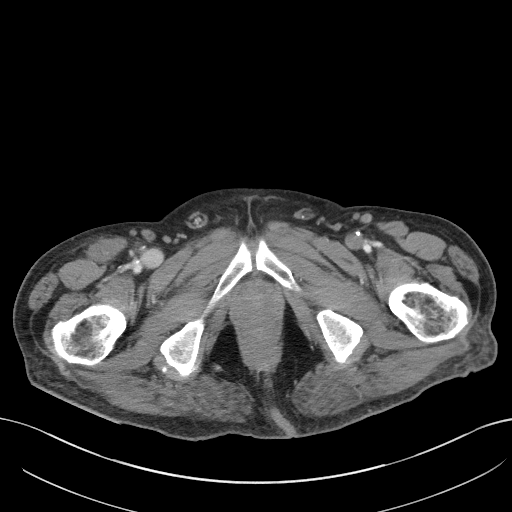
[im 6/95  bone]
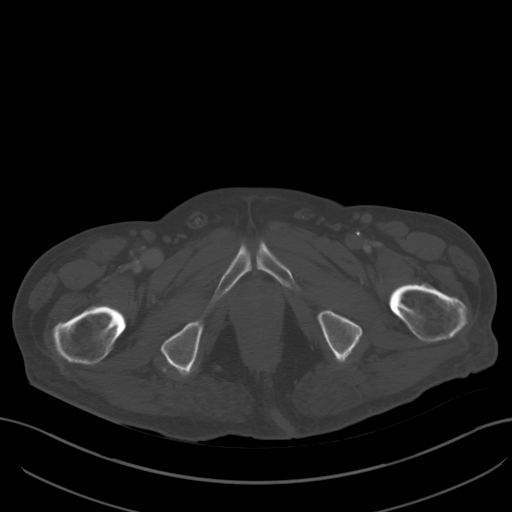
[im 18/95  soft-tissue]
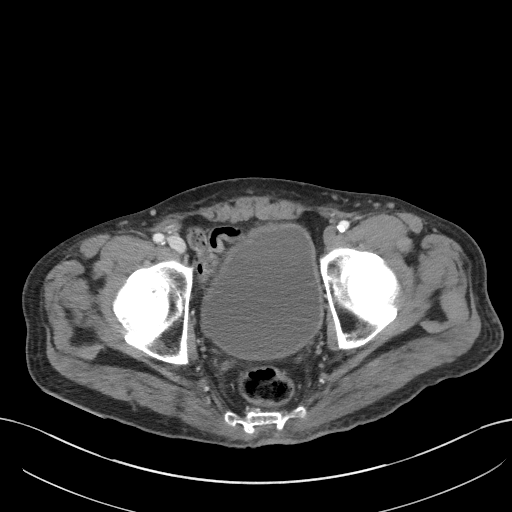
[im 24/95  soft-tissue]
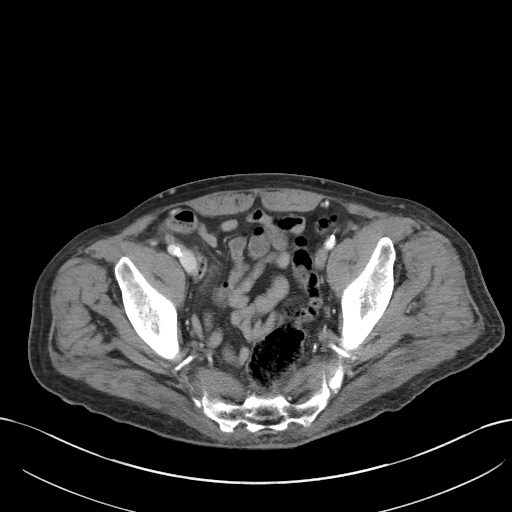
[im 30/95  soft-tissue]
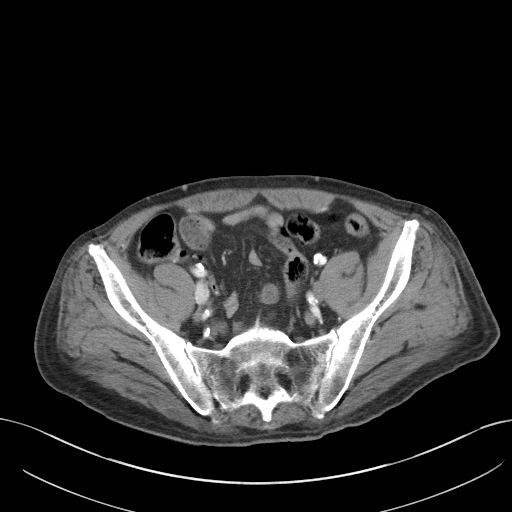
[im 42/95  soft-tissue]
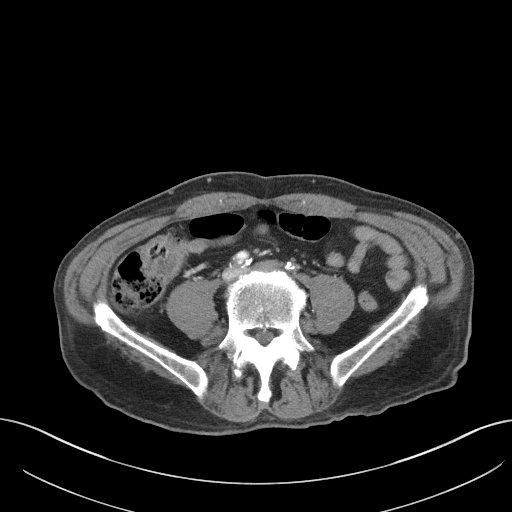
[im 48/95  soft-tissue]
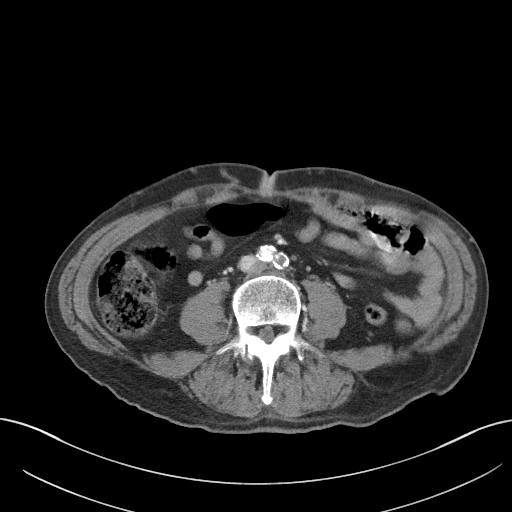
[im 53/95  soft-tissue]
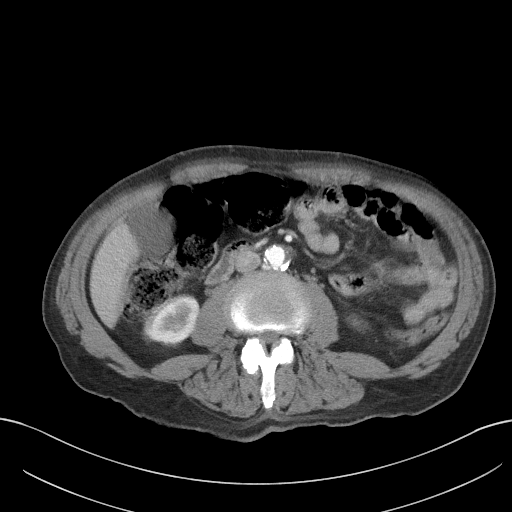
[im 65/95  soft-tissue]
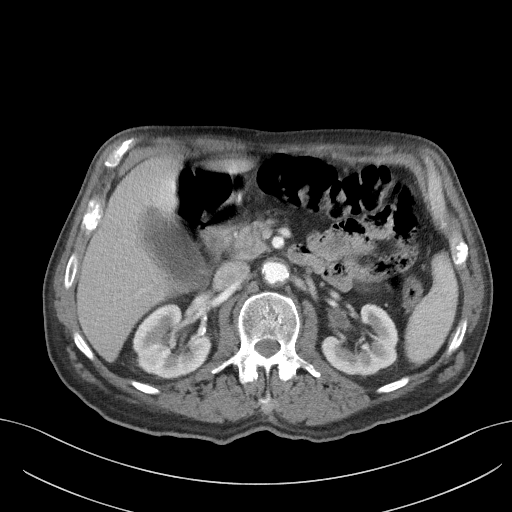
[im 71/95  soft-tissue]
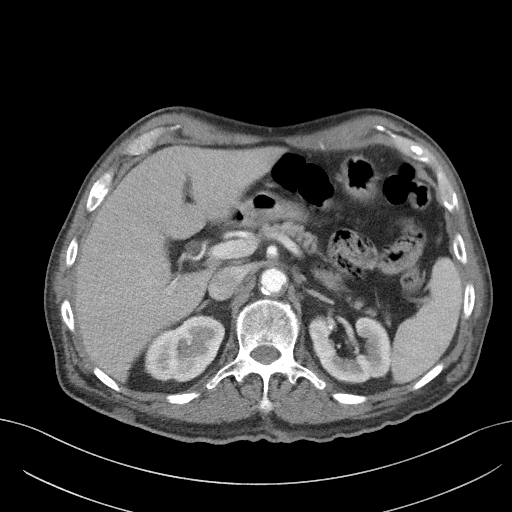
[im 71/95  bone]
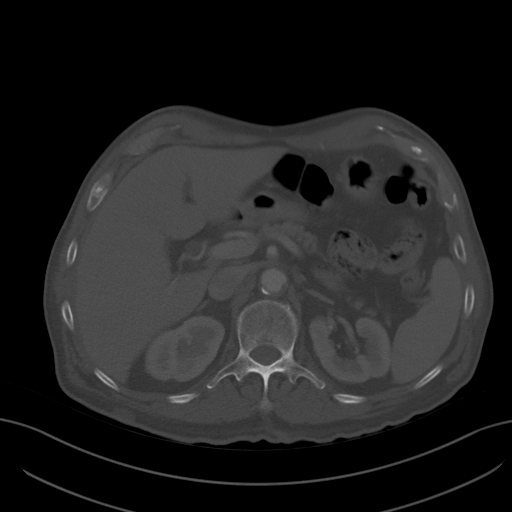
[im 77/95  soft-tissue]
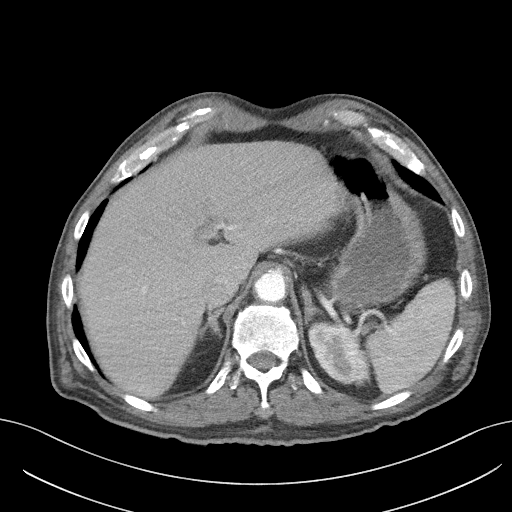
[im 89/95  soft-tissue]
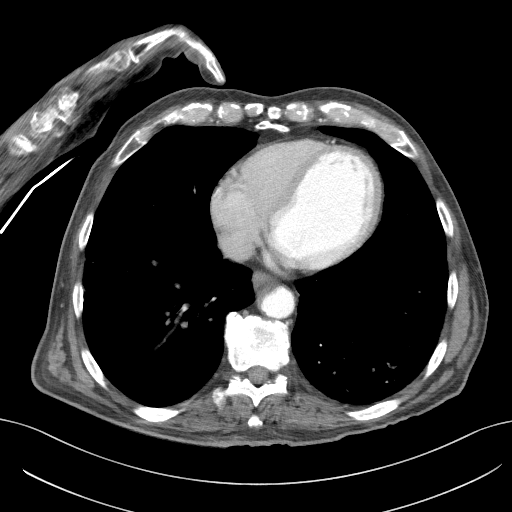

[Series 5: coronal st · coronal · 0.68mm/px · 3 of 95 slices shown]
[im 32/95  soft-tissue]
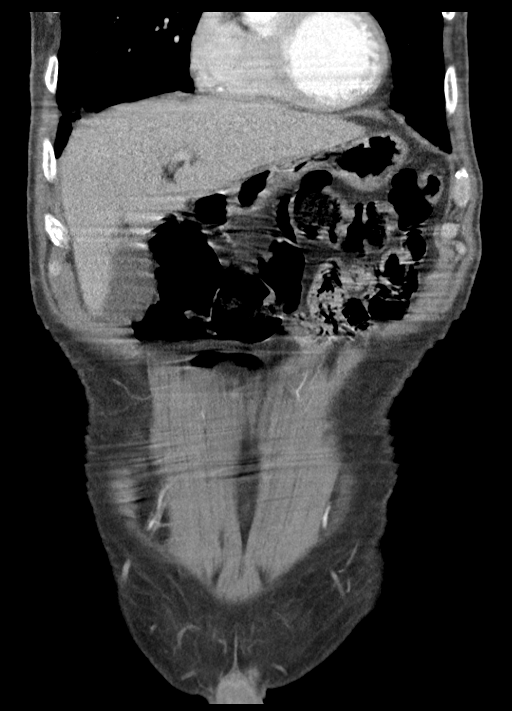
[im 42/95  soft-tissue]
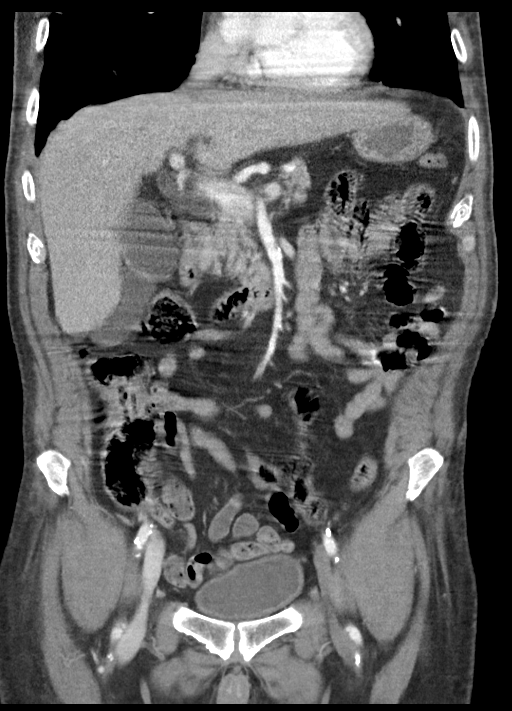
[im 53/95  soft-tissue]
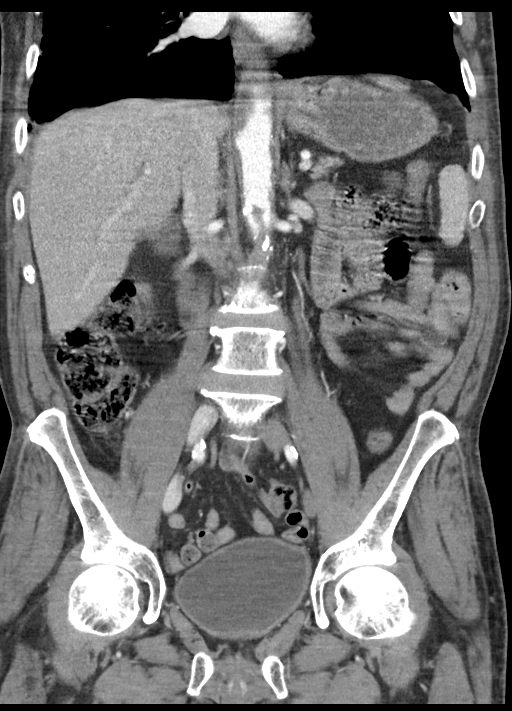

[14 of 46 positions shown; findings below may reference images not displayed]

RADIATION DOSE REDUCTION: This exam was performed according to the
departmental dose-optimization program which includes automated
exposure control, adjustment of the mA and/or kV according to
patient size and/or use of iterative reconstruction technique.

CONTRAST:  100mL OMNIPAQUE IOHEXOL 300 MG/ML  SOLN
FINDINGS: Lower chest: The cardiac size is normal. There is three-vessel
coronary artery disease.

There is scattered chronic pleural-parenchymal disease in the lung
bases. There was a 1.5 by 1.0 cm ovoid rounded nodule in the
anterior basal segment of the left lower lobe on the previous exam
which has resolved.

Spiculated left upper lobe mass above this level however, is not
included today and most likely cancerous. Likely synchronous
neoplasm in the right upper lobe is also above the plane of current
imaging.

There is asymmetric haziness in the posterior basal left lower lobe.
Dense consolidation was seen in this area previously. This could be
post pneumonic scarring or ongoing pneumonitis.

Hepatobiliary: 18 cm length mildly steatotic liver. No mass is seen
through the breathing motion artifact. There is distended but
otherwise unremarkable gallbladder allowing for breathing motion. No
biliary dilatation.

Pancreas: Unremarkable.

Spleen: No mass enhancement. Mild splenomegaly 13.8 cm in length
spleen.

Adrenals/Urinary Tract: There are renovascular calcifications at
both renal hila. No intrarenal stones or urinary obstruction are
observed.

There is no adrenal or renal cortical mass enhancement visible
through the breathing motion no hydronephrosis. The bladder
thickness is normal.

Stomach/Bowel: No dilatation or wall thickening including the
appendix. There are several dense stones in the appendix which could
be inspissated contrast.

There is moderate stool retention ascending colon. There is
diverticulosis without evidence of colitis or diverticulitis.

Vascular/Lymphatic: The abdominal aorta and iliac arteries are
heavily calcified with additional moderate soft plaque some of which
is ulcerative.

There is no penetrating aortic ulcer, dissection or aneurysm or
critical aortic stenosis, but there is thrombotic occlusion of the
left common iliac artery with reconstitution at the left external
iliac artery.

Furthermore the most proximal visualized course of both superficial
femoral arteries is also thrombosed and occluded with preservation
of flow in the visualized deep and circumflex femoral arteries and
common femoral arteries.

Aortic visceral branch arteries patent except for the IMA which is
mostly occluded. No adenopathy is seen.

Reproductive: Mild prostatomegaly. Prostate transverse diameter
cm.

Other: There is no incarcerated hernia. There is no free air,
hemorrhage or fluid.

Musculoskeletal: There is mild osteopenia and degenerative change of
the spine. No destructive skeletal lesion is seen.
IMPRESSION: 1. Gallbladder is distended and not well seen due to breathing
motion but there is no obvious wall thickening or calcified stone.
2. Left lower lobe posterior basal haziness consistent with post
pneumonic ground-glass scarring or ongoing pneumonitis. There
previously was dense consolidation in this area.
3. Aortic and coronary artery atherosclerosis.
4. Constipation and diverticulosis. No bowel obstruction or
inflammation.
5. There is a mostly occluded inferior mesenteric artery but the SMA
is widely patent as is the celiac artery. There is no flow-limiting
renal artery stenosis.
6. Occluded left common iliac artery with reconstitution in the
external iliac artery, with occlusion noted again in the visualized
portions of both proximal superficial femoral arteries.
7. Prostatomegaly.
8. Mildly prominent liver and spleen mild hepatic steatosis.
9. Several dense stones in the appendix possibly inspissated enteric
contrast. No evidence of appendicitis.

## 2022-05-06 MED ORDER — SODIUM CHLORIDE 0.9 % IV BOLUS
1000.0000 mL | Freq: Once | INTRAVENOUS | Status: AC
  Administered 2022-05-06: 1000 mL via INTRAVENOUS

## 2022-05-06 MED ORDER — PIPERACILLIN-TAZOBACTAM 3.375 G IVPB 30 MIN
3.3750 g | Freq: Once | INTRAVENOUS | Status: AC
  Administered 2022-05-06: 3.375 g via INTRAVENOUS
  Filled 2022-05-06: qty 50

## 2022-05-06 MED ORDER — ONDANSETRON 4 MG PO TBDP: 4.0000 mg | ORAL_TABLET | Freq: Three times a day (TID) | ORAL | 0 refills | Status: DC | PRN

## 2022-05-06 MED ORDER — IOHEXOL 300 MG/ML  SOLN
100.0000 mL | Freq: Once | INTRAMUSCULAR | Status: AC | PRN
  Administered 2022-05-06: 100 mL via INTRAVENOUS

## 2022-05-06 MED ORDER — SODIUM CHLORIDE (PF) 0.9 % IJ SOLN
INTRAMUSCULAR | Status: AC
Start: 2022-05-06 — End: 2022-05-06
  Filled 2022-05-06: qty 50

## 2022-05-06 MED ORDER — ACETAMINOPHEN 650 MG RE SUPP
650.0000 mg | Freq: Once | RECTAL | Status: AC
Start: 2022-05-06 — End: 2022-05-06
  Administered 2022-05-06: 650 mg via RECTAL
  Filled 2022-05-06: qty 1

## 2022-05-06 MED ORDER — ONDANSETRON HCL 4 MG/2ML IJ SOLN
4.0000 mg | Freq: Once | INTRAMUSCULAR | Status: AC
  Administered 2022-05-06: 4 mg via INTRAVENOUS
  Filled 2022-05-06: qty 2

## 2022-05-06 MED ORDER — CEPHALEXIN 500 MG PO CAPS
ORAL_CAPSULE | ORAL | 0 refills | Status: DC
Start: 2022-05-06 — End: 2022-05-30

## 2022-05-06 NOTE — Discharge Instructions (Addendum)
Get help right away if: You have very bad back pain. You have very bad pain in your lower belly. You have a fever. You have chills. You feeling like you will vomit or you vomit.

## 2022-05-06 NOTE — ED Provider Notes (Signed)
[facility] DEPT Provider Note   CSN: [id number] Arrival date & time: 05/06/22  0230     History  Chief Complaint  Patient presents with   Abdominal Pain   Emesis    Todd Estrada is a 61 y.o. 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  Cousin is at bedside providing much of the history.  Reports that since patient's left MCA stroke he has had aphasia.  He states that patient ate at [employer] earlier today and ate 2 bacon cheeseburgers.  Shortly afterwards began having nonbloody nonbilious emesis.  He reports that patient was complaining of abdominal pain and pointing all over however patient currently denies any pain.  Cousin was unaware that patient had a fever however presented febrile at 100.8 to the ED.  He does report that he had a PEG tube placed during hospital stay that was recently dislodged about 3 weeks ago.  He spoke with patient's doctor who stated it was okay and to just keep an eye on it.  Reports that the area has been healing well.  Patient has been able to tolerate solids since being home without difficulty until earlier tonight.  The history is provided by the patient, medical records and a relative.      Home Medications Prior to Admission medications   Medication Sig Start Date End Date Taking? Authorizing Provider  acetaminophen (TYLENOL) 325 MG tablet Take 1-2 tablets (325-650 mg total) by mouth every 4 (four) hours as needed for mild pain. 04/14/22   Setzer, Edman Circle, PA-C  aspirin 81 MG EC tablet Take 1 tablet (81 mg total) by mouth daily. Swallow whole. 04/14/22   Setzer, Edman Circle, PA-C  atorvastatin (LIPITOR) 40 MG tablet Take 1 tablet (40 mg total) by mouth daily. 04/15/22   Setzer, Edman Circle, PA-C  bacitracin ointment Apply topically 2 (two) times daily. To feeding tube site. 04/14/22   Setzer, Edman Circle, PA-C   budesonide (PULMICORT) 0.5 MG/2ML nebulizer solution Take 2 mLs (0.5 mg total) by nebulization 2 (two) times daily. 05/04/22   Freddi Starr, MD  clopidogrel (PLAVIX) 75 MG tablet Take 1 tablet (75 mg total) by mouth daily. 04/15/22   Setzer, Edman Circle, PA-C  folic acid (FOLVITE) 1 MG tablet Take 1 tablet (1 mg total) by mouth daily. 04/15/22   Setzer, Edman Circle, PA-C  guaiFENesin (ROBITUSSIN) 100 MG/5ML liquid Take 15 mLs by mouth every 6 (six) hours as needed for cough or to loosen phlegm. 04/14/22   Setzer, Edman Circle, PA-C  levETIRAcetam (KEPPRA) 500 MG tablet Take 1 tablet (500 mg total) by mouth 2 (two) times daily. 04/14/22   Setzer, Edman Circle, PA-C  Multiple Vitamin (MULTIVITAMIN WITH MINERALS) TABS tablet Take 1 tablet by mouth daily. 04/15/22   Setzer, Edman Circle, PA-C  pantoprazole (PROTONIX) 40 MG tablet Take 1 tablet (40 mg total) by mouth daily. 04/15/22   Setzer, Edman Circle, PA-C  pantoprazole sodium (PROTONIX) 40 mg Take 40 mg by mouth daily. 04/15/22   Setzer, Edman Circle, PA-C  thiamine 100 MG tablet Take 1 tablet (100 mg total) by mouth daily. 04/15/22   Setzer, Edman Circle, PA-C  Tiotropium Bromide-Olodaterol (STIOLTO RESPIMAT) 2.5-2.5 MCG/ACT AERS Inhale 2 puffs into the lungs daily. 05/04/22   Freddi Starr, MD  Tiotropium Bromide-Olodaterol (STIOLTO RESPIMAT) 2.5-2.5 MCG/ACT AERS Inhale 2 puffs into the lungs  daily. 05/04/22   Freddi Starr, MD      Allergies    Patient has no known allergies.    Review of Systems   Review of Systems  Constitutional:  Positive for fever.  Gastrointestinal:  Positive for abdominal pain, nausea and vomiting. Negative for diarrhea.  All other systems reviewed and are negative.  Physical Exam Updated Vital Signs BP 131/83 (BP Location: Left Arm)   Pulse (!) 118   Temp (!) 100.8 F (38.2 C) (Oral)   Resp 19   Ht 6\' 1"  (1.854 m)   Wt 80.3 kg   SpO2 94%   BMI 23.35 kg/m  Physical Exam Vitals and nursing note reviewed.  Constitutional:       Appearance: He is not ill-appearing.  HENT:     Head: Normocephalic and atraumatic.     Mouth/Throat:     Mouth: Mucous membranes are dry.  Eyes:     Conjunctiva/sclera: Conjunctivae normal.  Cardiovascular:     Rate and Rhythm: Regular rhythm. Tachycardia present.  Pulmonary:     Effort: Pulmonary effort is normal.     Breath sounds: Normal breath sounds.  Abdominal:     General: Bowel sounds are normal.     Palpations: Abdomen is soft.     Tenderness: There is no abdominal tenderness. There is no guarding or rebound.  Musculoskeletal:     Cervical back: Neck supple.  Skin:    General: Skin is warm and dry.  Neurological:     Mental Status: He is alert.    ED Results / Procedures / Treatments   Labs (all labs ordered are listed, but only abnormal results are displayed) Labs Reviewed  COMPREHENSIVE METABOLIC PANEL - Abnormal; Notable for the following components:      Result Value   Glucose, Bld 143 (*)    All other components within normal limits  CBC WITH DIFFERENTIAL/PLATELET - Abnormal; Notable for the following components:   WBC 16.0 (*)    Neutro Abs 14.1 (*)    All other components within normal limits  CULTURE, BLOOD (ROUTINE X 2)  CULTURE, BLOOD (ROUTINE X 2)  RESP PANEL BY RT-PCR (FLU A&B, COVID) ARPGX2  LIPASE, BLOOD  LACTIC ACID, PLASMA  URINALYSIS, ROUTINE W REFLEX MICROSCOPIC    EKG None  Radiology CT Abdomen Pelvis W Contrast  Result Date: 05/06/2022 CLINICAL DATA:  Abdominal pain and leukocytosis. EXAM: CT ABDOMEN AND PELVIS WITH CONTRAST TECHNIQUE: Multidetector CT imaging of the abdomen and pelvis was performed using the standard protocol following bolus administration of intravenous contrast. RADIATION DOSE REDUCTION: This exam was performed according to the departmental dose-optimization program which includes automated exposure control, adjustment of the mA and/or kV according to patient size and/or use of iterative reconstruction technique.  CONTRAST:  150mL OMNIPAQUE IOHEXOL 300 MG/ML  SOLN COMPARISON:  Chest CT 03/18/2022.  No prior abdomen and pelvis. FINDINGS: Lower chest: The cardiac size is normal. There is three-vessel coronary artery disease. There is scattered chronic pleural-parenchymal disease in the lung bases. There was a 1.5 by 1.0 cm ovoid rounded nodule in the anterior basal segment of the left lower lobe on the previous exam which has resolved. Spiculated left upper lobe mass above this level however, is not included today and most likely cancerous. Likely synchronous neoplasm in the right upper lobe is also above the plane of current imaging. There is asymmetric haziness in the posterior basal left lower lobe. Dense consolidation was seen in this area previously. This could  be post pneumonic scarring or ongoing pneumonitis. Hepatobiliary: 18 cm length mildly steatotic liver. No mass is seen through the breathing motion artifact. There is distended but otherwise unremarkable gallbladder allowing for breathing motion. No biliary dilatation. Pancreas: Unremarkable. Spleen: No mass enhancement. Mild splenomegaly 13.8 cm in length spleen. Adrenals/Urinary Tract: There are renovascular calcifications at both renal hila. No intrarenal stones or urinary obstruction are observed. There is no adrenal or renal cortical mass enhancement visible through the breathing motion no hydronephrosis. The bladder thickness is normal. Stomach/Bowel: No dilatation or wall thickening including the appendix. There are several dense stones in the appendix which could be inspissated contrast. There is moderate stool retention ascending colon. There is diverticulosis without evidence of colitis or diverticulitis. Vascular/Lymphatic: The abdominal aorta and iliac arteries are heavily calcified with additional moderate soft plaque some of which is ulcerative. 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 Furthermore the most proximal visualized course of both superficial femoral arteries is also thrombosed and occluded with preservation of flow in the visualized deep and circumflex femoral arteries and common femoral arteries. Aortic visceral branch arteries patent except for the IMA which is mostly occluded. No adenopathy is seen. Reproductive: Mild prostatomegaly. Prostate transverse diameter 4.6 cm. Other: There is no incarcerated hernia. There is no free air, hemorrhage or fluid. Musculoskeletal: There is mild osteopenia and degenerative change of the spine. No destructive skeletal lesion is seen. IMPRESSION: 1. Gallbladder is distended and not well seen due to breathing motion but there is no obvious wall thickening or calcified stone. 2. Left lower lobe posterior basal haziness consistent with post pneumonic ground-glass scarring or ongoing pneumonitis. There previously was dense consolidation in this area. 3. Aortic and coronary artery atherosclerosis. 4. Constipation and diverticulosis. No bowel obstruction or inflammation. 5. There is a mostly occluded inferior mesenteric artery but the SMA is widely patent as is the celiac artery. There is no flow-limiting renal artery stenosis. 6. Occluded left common iliac artery with reconstitution in the external iliac artery, with occlusion noted again in the visualized portions of both proximal superficial femoral arteries. 7. Prostatomegaly. 8. Mildly prominent liver and spleen mild hepatic steatosis. 9. Several dense stones in the appendix possibly inspissated enteric contrast. No evidence of appendicitis. Electronically Signed   By: Telford Nab M.D.   On: 05/06/2022 06:12   DG Chest Portable 1 View  Result Date: 05/06/2022 CLINICAL DATA:  61 year old male with signs and symptoms concerning for potential aspiration. EXAM: PORTABLE CHEST 1 VIEW  COMPARISON:  Chest x-ray 03/28/2022. FINDINGS: Mass-like fullness of the left hilum increased compared to the prior examination. Subtle ill-defined nodular density projecting over the inferior aspect of the right upper lobe also noted. There are some bibasilar opacities which may reflect areas of atelectasis and/or consolidation (left-greater-than-right). No pleural effusions. No pneumothorax. No evidence of pulmonary edema. Heart size is upper limits of normal. Upper mediastinal contours are within normal limits. Atherosclerotic calcifications are noted in the thoracic aorta. IMPRESSION: 1. Bibasilar opacities may reflect areas of atelectasis and/or consolidation. 2. Increasing mass-like fullness in the left hilar region and persistent ill-defined opacity in the inferior aspect of the right upper lobe. These findings are highly concerning for progressive neoplasm (please see prior chest CT 03/18/2022 for full description of suspicious lung lesions bilaterally). 3. Aortic atherosclerosis. Electronically Signed   By: Vinnie Langton  M.D.   On: 05/06/2022 05:04    Procedures Procedures    Medications Ordered in ED Medications  sodium chloride 0.9 % bolus 1,000 mL (0 mLs Intravenous Stopped 05/06/22 0616)  ondansetron (ZOFRAN) injection 4 mg (4 mg Intravenous Given 05/06/22 0427)  acetaminophen (TYLENOL) suppository 650 mg (650 mg Rectal Given 05/06/22 0427)  piperacillin-tazobactam (ZOSYN) IVPB 3.375 g (0 g Intravenous Stopped 05/06/22 0616)  sodium chloride (PF) 0.9 % injection (  Given by Other 05/06/22 0505)  iohexol (OMNIPAQUE) 300 MG/ML solution 100 mL (100 mLs Intravenous Contrast Given 05/06/22 0528)    ED Course/ Medical Decision Making/ A&P                           Medical Decision Making 61 year old male who presents to the ED today with complaint of abdominal pain, nausea, vomiting, fevers that began earlier tonight after eating [employer] hamburgers.  On arrival to the ED patient is febrile  100.8 which he was unaware of.  He is tachycardic.  He apparently vomited in triage.  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  Cousin at bedside biting history.  On exam patient without any abdominal tenderness palpation.  When asked if he is having pain he shakes his head no.  Cousin reports PEG tube dislodgment about 3 weeks ago otherwise denies any abdominal surgeries.    Patient did have labs obtained in triage.  CBC has returned with a leukocytosis of 16,000 without left shift.  Given fever, tachycardia, white blood cell count, and concern for intra-abdominal infection patient currently meets sepsis criteria.  Lactic acid added to work-up.  Will provide Tylenol for fever, fluids, antiemetics.  Will obtain blood cultures and start on Zosyn for suspected intra-abdominal infection.  Given history of vomiting starting after eating [employer] concern for likely gallbladder etiology.  Ultrasound not readily available at this time of 9 needs to be called in and therefore we will start with CT scan and obtain ultrasound as needed.   CMP without electrolyte abnormalities. LFTs unremarkable.  Lipase WNL at 26 Lactic acid 1.5  CXR: IMPRESSION:  1. Bibasilar opacities may reflect areas of atelectasis and/or  consolidation.  2. Increasing mass-like fullness in the left hilar region and  persistent ill-defined opacity in the inferior aspect of the right  upper lobe. These findings are highly concerning for progressive  neoplasm (please see prior chest CT 03/18/2022 for full description  of suspicious lung lesions bilaterally).  3. Aortic atherosclerosis.   CT: IMPRESSION:  1. Gallbladder is distended and not well seen due to breathing  motion but there is no obvious wall thickening or calcified stone.  2. Left lower lobe posterior basal haziness consistent with post  pneumonic ground-glass scarring or ongoing pneumonitis. There  previously was dense consolidation in this area.   3. Aortic and coronary artery atherosclerosis.  4. Constipation and diverticulosis. No bowel obstruction or  inflammation.  5. There is a mostly occluded inferior mesenteric artery but the SMA  is widely patent as is the celiac artery. There is no flow-limiting  renal artery stenosis.  6. Occluded left common iliac artery with reconstitution in the  external iliac artery, with occlusion noted again in the visualized  portions of both proximal superficial femoral arteries.  7. Prostatomegaly.  8. Mildly prominent liver and spleen mild hepatic steatosis.  9. Several dense stones in the appendix possibly inspissated enteric  contrast. No evidence of appendicitis.   In  the setting of gallbladder distention seen on CT scan, persistent vomiting, and fever will proceed with RUQ ultrasound at this time. Remainder of findings do appear chronic.    Amount and/or Complexity of Data Reviewed Labs: ordered. Decision-making details documented in ED Course. Radiology: ordered.  Risk OTC drugs. Prescription drug management.   At shift change case signed out to [facility], PA-C, who will follow up on ultrasound and dispo accordingly.         Final Clinical Impression(s) / ED Diagnoses Final diagnoses:  None    Rx / DC Orders ED Discharge Orders     None         Eustaquio Maize, PA-C 05/06/22 1610    Veryl Speak, MD 05/07/22 0425

## 2022-05-06 NOTE — ED Triage Notes (Signed)
Pt ate 2 cheeseburgers last night and started vomiting shortly after. Pt complains of abdominal pain.

## 2022-05-06 NOTE — ED Provider Notes (Addendum)
Assumed care of patient ftrom Pa venter Patient w/ hx of Left MCA stroke, aphasic Arrived for abd pain, n/v and fever Awaiting RUQ scan today.  ? Pneumonitis v cholangitis. Tx w. Zosyn No active vomiting  If negative dc w. Augmentin   7:24 AM I reevaluated the patient _ no tenderness to palpation of the abdomen. No Murphy's sign. UQ + for infection. Willl d/c with Keflex/ referral to surgery for gallstones  Clinical Course as of 05/06/22 0725  Fri May 06, 2022  0722 Urinalysis, Routine w reflex microscopic Urine, Clean Catch(!) +positive for UTI [AH]    Clinical Course User Index [AH] Margarita Mail, PA-C    MDM Number of Diagnoses or Management Options Acute cystitis with hematuria: new, needed workup Aphasia as late effect of cerebrovascular accident (CVA): established, improving Gallstones: new, needed workup Lung mass: established, worsening Nausea and vomiting, unspecified vomiting type: new, needed workup   Amount and/or Complexity of Data Reviewed Clinical lab tests: reviewed Tests in the radiology section of CPT: reviewed Obtain history from someone other than the patient: yes Review and summarize past medical records: yes Discuss the patient with other providers: yes (PA Venter at shift change) Independent visualization of images, tracings, or specimens: yes  Risk of Complications, Morbidity, and/or Mortality Presenting problems: moderate  Patient Progress Patient progress: improved     Margarita Mail, PA-C 05/06/22 0735    Dorie Rank, MD 05/06/22 [phone]

## 2022-05-06 NOTE — ED Notes (Signed)
Pt. Made aware for the need of urine specimen. 

## 2022-05-09 ENCOUNTER — Encounter: Payer: Self-pay | Admitting: Internal Medicine

## 2022-05-09 ENCOUNTER — Ambulatory Visit: Payer: Medicaid Other | Admitting: Internal Medicine

## 2022-05-09 ENCOUNTER — Other Ambulatory Visit: Payer: Self-pay

## 2022-05-09 VITALS — BP 91/58 | HR 86 | Temp 98.5°F | Ht 73.0 in | Wt 170.3 lb

## 2022-05-09 DIAGNOSIS — R918 Other nonspecific abnormal finding of lung field: Secondary | ICD-10-CM

## 2022-05-09 DIAGNOSIS — F17200 Nicotine dependence, unspecified, uncomplicated: Secondary | ICD-10-CM | POA: Diagnosis not present

## 2022-05-09 DIAGNOSIS — I63512 Cerebral infarction due to unspecified occlusion or stenosis of left middle cerebral artery: Secondary | ICD-10-CM

## 2022-05-09 DIAGNOSIS — T300 Burn of unspecified body region, unspecified degree: Secondary | ICD-10-CM

## 2022-05-09 DIAGNOSIS — N390 Urinary tract infection, site not specified: Secondary | ICD-10-CM | POA: Diagnosis not present

## 2022-05-09 LAB — URINE CULTURE
Culture: >=100000 — AB
Special Requests: NORMAL

## 2022-05-09 MED ORDER — ASPIRIN 81 MG PO TBEC: 81.0000 mg | DELAYED_RELEASE_TABLET | Freq: Every day | ORAL | 0 refills | Status: DC

## 2022-05-09 MED ORDER — FOLIC ACID 1 MG PO TABS: 1.0000 mg | ORAL_TABLET | Freq: Every day | ORAL | 0 refills | Status: DC

## 2022-05-09 MED ORDER — CLOPIDOGREL BISULFATE 75 MG PO TABS: 75.0000 mg | ORAL_TABLET | Freq: Every day | ORAL | 0 refills | Status: DC

## 2022-05-09 MED ORDER — NICOTINE POLACRILEX 4 MG MT LOZG: 4.0000 mg | LOZENGE | OROMUCOSAL | 0 refills | Status: DC | PRN

## 2022-05-09 MED ORDER — ADULT MULTIVITAMIN W/MINERALS CH: 1.0000 | ORAL_TABLET | Freq: Every day | ORAL | 0 refills | Status: DC

## 2022-05-09 MED ORDER — PANTOPRAZOLE SODIUM 40 MG PO TBEC: 40.0000 mg | DELAYED_RELEASE_TABLET | Freq: Every day | ORAL | 0 refills | Status: DC

## 2022-05-09 MED ORDER — THIAMINE HCL 100 MG PO TABS
100.0000 mg | ORAL_TABLET | Freq: Every day | ORAL | 0 refills | Status: DC
Start: 2022-05-09 — End: 2022-05-25

## 2022-05-09 MED ORDER — ATORVASTATIN CALCIUM 40 MG PO TABS: 40.0000 mg | ORAL_TABLET | Freq: Every day | ORAL | 0 refills | Status: DC

## 2022-05-09 MED ORDER — NICOTINE 21 MG/24HR TD PT24: 21.0000 mg | MEDICATED_PATCH | TRANSDERMAL | 1 refills | Status: DC

## 2022-05-09 MED ORDER — LEVETIRACETAM 500 MG PO TABS: 500.0000 mg | ORAL_TABLET | Freq: Two times a day (BID) | ORAL | 0 refills | Status: DC
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
## 2022-05-10 ENCOUNTER — Telehealth (HOSPITAL_COMMUNITY): Payer: Self-pay | Admitting: Pharmacist

## 2022-05-10 ENCOUNTER — Other Ambulatory Visit (HOSPITAL_COMMUNITY): Payer: Self-pay

## 2022-05-10 ENCOUNTER — Telehealth: Payer: Self-pay | Admitting: *Deleted

## 2022-05-10 ENCOUNTER — Telehealth: Payer: Self-pay | Admitting: Emergency Medicine

## 2022-05-10 DIAGNOSIS — C3490 Malignant neoplasm of unspecified part of unspecified bronchus or lung: Secondary | ICD-10-CM | POA: Insufficient documentation

## 2022-05-10 DIAGNOSIS — T300 Burn of unspecified body region, unspecified degree: Secondary | ICD-10-CM | POA: Insufficient documentation

## 2022-05-10 DIAGNOSIS — R918 Other nonspecific abnormal finding of lung field: Secondary | ICD-10-CM | POA: Insufficient documentation
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
## 2022-05-10 NOTE — Assessment & Plan Note (Signed)
Patient with a history of moderate to severe COPD, and mass found in the left upper lobe who is followed by pulmonology.  On CT the mass was a lobulated, spiculated 2.7 x 3.7 cm mass with an additional 16 x 20 mm spiculated nodule within the posterior segment of the right upper lobe.  This thought to lung cancer secondary to his extensive history of smoking and tobacco abuse.  He is due for his PET scan on 05/12/2022.  During his last pulmonology visit on 05/04/2022, where navigational bronchoscopy was discussed as a next up pending the results of his PET scan. - Patient to present for PET scan - Continue to follow with pulmonology

## 2022-05-10 NOTE — Telephone Encounter (Signed)
Don called for OT POC 1wk2. Approval given.

## 2022-05-10 NOTE — Assessment & Plan Note (Signed)
Patient seen in the ED on 05/06/2022 with complaints of abdominal pain, nausea and vomiting.  Patient was discharged with Keflex in the setting of UTI which grew greater than 100,000 colonies of E. coli.  Patient is currently compliant with his medications and completing his current course of antibiotics, but his nausea and vomiting have gotten better.  He is able to keep food down at this time. - Complete 7-day course of Keflex
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
## 2022-05-10 NOTE — Telephone Encounter (Signed)
Post ED Visit - Positive Culture Follow-up  Culture report reviewed by antimicrobial stewardship pharmacist: [place] Team []  Elenor Quinones, Pharm.D. []  Heide Guile, Pharm.D., BCPS AQ-ID []  Parks Neptune, Pharm.D., BCPS [x]  Alycia Rossetti, Pharm.D., BCPS []  [place], Pharm.D., BCPS, AAHIVP []  Legrand Como, Pharm.D., BCPS, AAHIVP []  Salome Arnt, PharmD, BCPS []  Johnnette Gourd, PharmD, BCPS []  Hughes Better, PharmD, BCPS []  Leeroy Cha, PharmD []  Laqueta Linden, PharmD, BCPS []  Albertina Parr, PharmD  [place] Team []  Leodis Sias, PharmD []  Lindell Spar, PharmD []  Royetta Asal, PharmD []  Graylin Shiver, Rph []  Rema Fendt) Glennon Mac, PharmD []  Arlyn Dunning, PharmD []  Netta Cedars, PharmD []  Dia Sitter, PharmD []  Leone Haven, PharmD []  Gretta Arab, PharmD []  Theodis Shove, PharmD []  Peggyann Juba, PharmD []  Reuel Boom, PharmD   Positive urine culture Treated with cephalexin, organism sensitive to the same and no further patient follow-up is required at this time.  [name] 05/10/2022, 8:48 AM
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
## 2022-05-10 NOTE — Telephone Encounter (Signed)
Pharmacy Transitions of Care Follow-up Telephone Call  Date of discharge: 04/15/2022    How have you been since you were released from the hospital? Good   Medication changes made at discharge:  - START: APAP, MVI, guaifenesin, levetiracetam, pantoprazole, Stiolto  - STOPPED: arformoterol, glycopyrrolate, quetiapine, revefeneacin  - CHANGED: atorvastatin, clopidogrel, folic acid, thiamine  Medication changes verified by the patient? Yes (Yes/No)    Medication Accessibility:   Was the patient provided with refills on discharged medications? No     Medication Review:  CLOPIDOGREL (PLAVIX) Clopidogrel 75 mg once daily.  - Educated patient on expected duration of therapy of  with clopidogrel. Advised patient that aspirin will be continued indefinitely.  - Reviewed potential DDIs with patient  - Advised patient of medications to avoid (NSAIDs, ASA)  - Educated that Tylenol (acetaminophen) will be the preferred analgesic to prevent risk of bleeding  - Emphasized importance of monitoring for signs and symptoms of bleeding (abnormal bruising, prolonged bleeding, nose bleeds, bleeding from gums, discolored urine, black tarry stools)  - Advised patient to alert all providers of anticoagulation therapy prior to starting a new medication or having a procedure   Final Patient Assessment: Patient doing well and attending follow up appts.  They had new scripts sent to their pharmacy at the time of follow up visits.

## 2022-05-11 LAB — CULTURE, BLOOD (ROUTINE X 2)
Culture: NO GROWTH
Culture: NO GROWTH
Special Requests: ADEQUATE

## 2022-05-11 NOTE — Progress Notes (Signed)
Internal Medicine Clinic Attending ? ?Case discussed with Dr. Winters  At the time of the visit.  We reviewed the resident?s history and exam and pertinent patient test results.  I agree with the assessment, diagnosis, and plan of care documented in the resident?s note.  ?

## 2022-05-11 NOTE — Assessment & Plan Note (Addendum)
Patient presents for hospital follow up and establishment of care. 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 He was able to complete inpatient rehab and is doing well at home. He does ambulate without an aide. He is currently on ASA, Plavix, and Lipitor. He is continuing to see PM&R in the outpatient setting for further therapy. He has an appointment with Neurology on 06/02/2022. He denies any new weakness, sensory changes, or worsening of his current state since admission.  - Continue ASA 81 mg - Continue Lipitor 40 mg - Continue Plavix 75 mg daily, there was no stop date for this medication in the hospital notes or discharge summaries, will have patient further discuss with neurology as this is typically a 3 week course post stroke. - Follow up with Neurology appointment

## 2022-05-12 ENCOUNTER — Ambulatory Visit (HOSPITAL_COMMUNITY)
Admission: RE | Admit: 2022-05-12 | Discharge: 2022-05-12 | Disposition: A | Discharge status: Home / Self Care | Payer: Medicaid Other | Source: Ambulatory Visit | Attending: Pulmonary Disease | Admitting: Pulmonary Disease

## 2022-05-12 ENCOUNTER — Emergency Department (HOSPITAL_COMMUNITY): Payer: Medicaid Other

## 2022-05-12 ENCOUNTER — Emergency Department (HOSPITAL_COMMUNITY)
Admission: EM | Admit: 2022-05-12 | Discharge: 2022-05-13 | Disposition: A | Discharge status: Home / Self Care | Payer: Medicaid Other | Attending: Emergency Medicine | Admitting: Emergency Medicine

## 2022-05-12 ENCOUNTER — Encounter (HOSPITAL_COMMUNITY): Payer: Self-pay | Admitting: Emergency Medicine

## 2022-05-12 ENCOUNTER — Encounter (HOSPITAL_COMMUNITY): Payer: Medicaid Other

## 2022-05-12 DIAGNOSIS — J45909 Unspecified asthma, uncomplicated: Secondary | ICD-10-CM | POA: Insufficient documentation

## 2022-05-12 DIAGNOSIS — Z7902 Long term (current) use of antithrombotics/antiplatelets: Secondary | ICD-10-CM | POA: Diagnosis not present

## 2022-05-12 DIAGNOSIS — I10 Essential (primary) hypertension: Secondary | ICD-10-CM | POA: Insufficient documentation

## 2022-05-12 DIAGNOSIS — R918 Other nonspecific abnormal finding of lung field: Secondary | ICD-10-CM | POA: Diagnosis present

## 2022-05-12 DIAGNOSIS — R531 Weakness: Secondary | ICD-10-CM | POA: Diagnosis not present

## 2022-05-12 DIAGNOSIS — Z7951 Long term (current) use of inhaled steroids: Secondary | ICD-10-CM | POA: Insufficient documentation

## 2022-05-12 DIAGNOSIS — W109XXA Fall (on) (from) unspecified stairs and steps, initial encounter: Secondary | ICD-10-CM | POA: Insufficient documentation

## 2022-05-12 DIAGNOSIS — J449 Chronic obstructive pulmonary disease, unspecified: Secondary | ICD-10-CM | POA: Diagnosis not present

## 2022-05-12 DIAGNOSIS — Z87891 Personal history of nicotine dependence: Secondary | ICD-10-CM | POA: Insufficient documentation

## 2022-05-12 DIAGNOSIS — Y92009 Unspecified place in unspecified non-institutional (private) residence as the place of occurrence of the external cause: Secondary | ICD-10-CM | POA: Insufficient documentation

## 2022-05-12 DIAGNOSIS — M25511 Pain in right shoulder: Secondary | ICD-10-CM | POA: Insufficient documentation

## 2022-05-12 LAB — GLUCOSE, CAPILLARY: Glucose-Capillary: 118 mg/dL — ABNORMAL HIGH (ref 70–99)

## 2022-05-12 IMAGING — DX DG FOREARM 2V*R*
2 series · 2 of 2 positions shown · non-contrast
Comparison: None Available.

CLINICAL DATA: Fall, right arm pain

EXAM:
RIGHT FOREARM - 2 VIEW

[forearm ap]
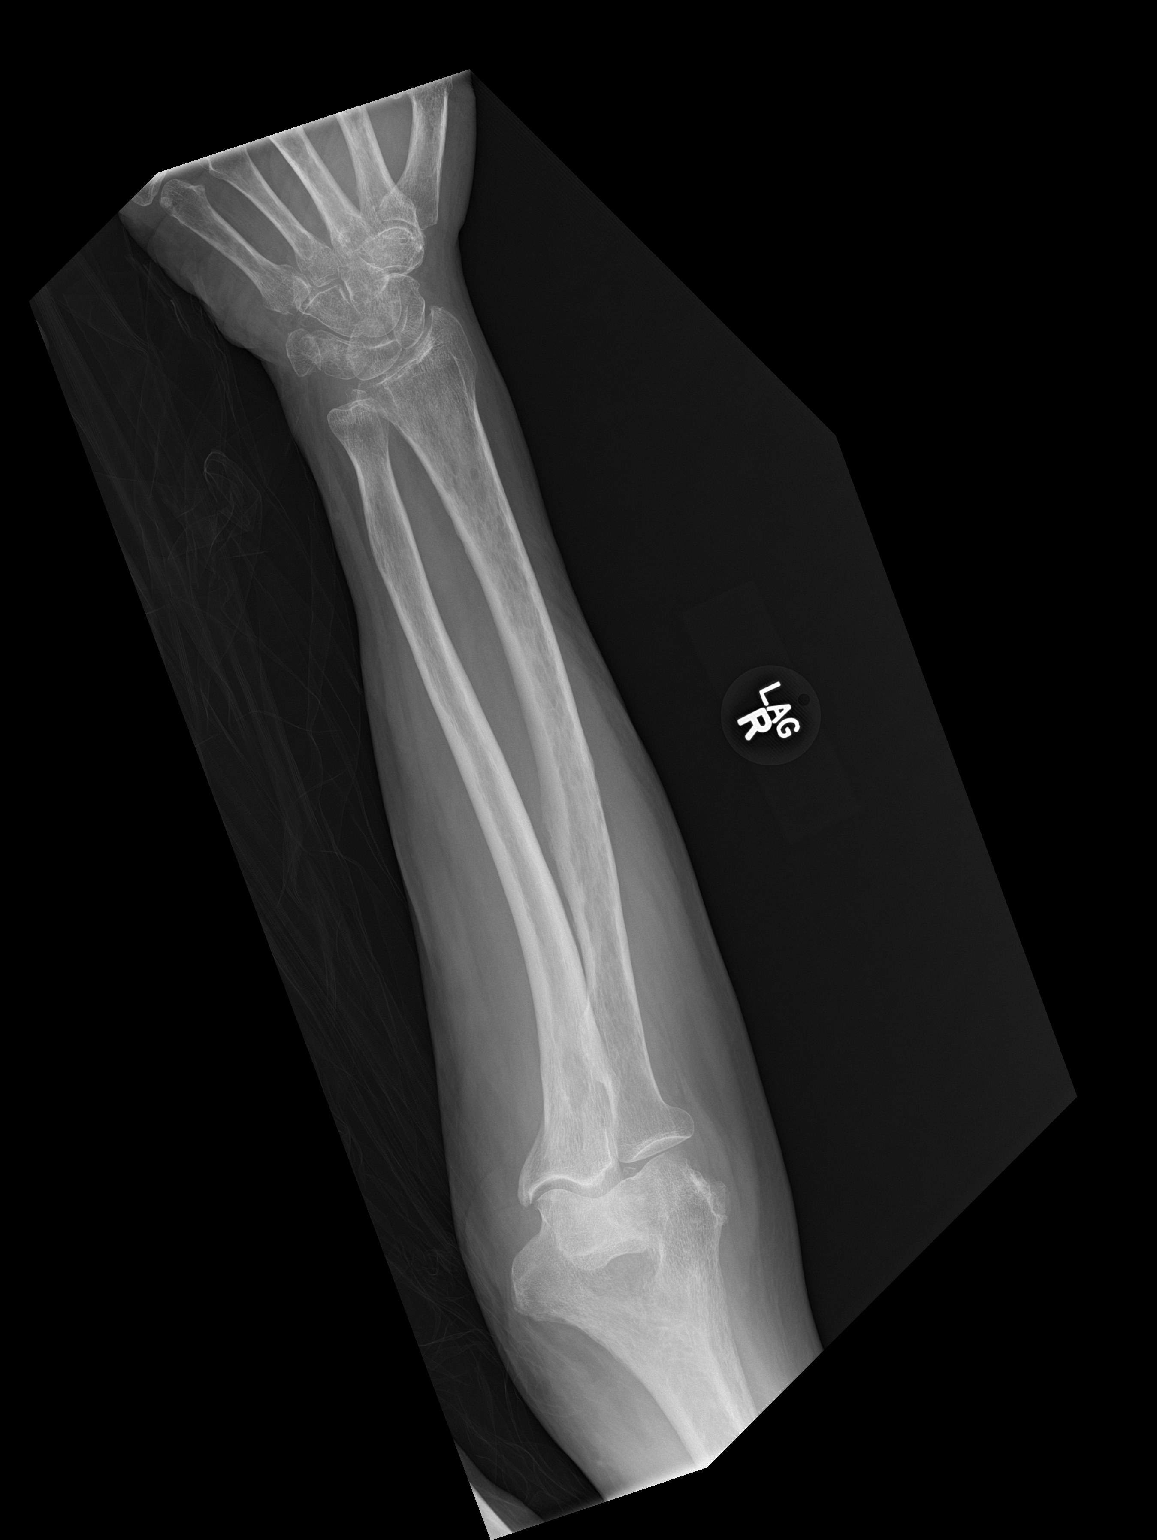

[forearm lat]
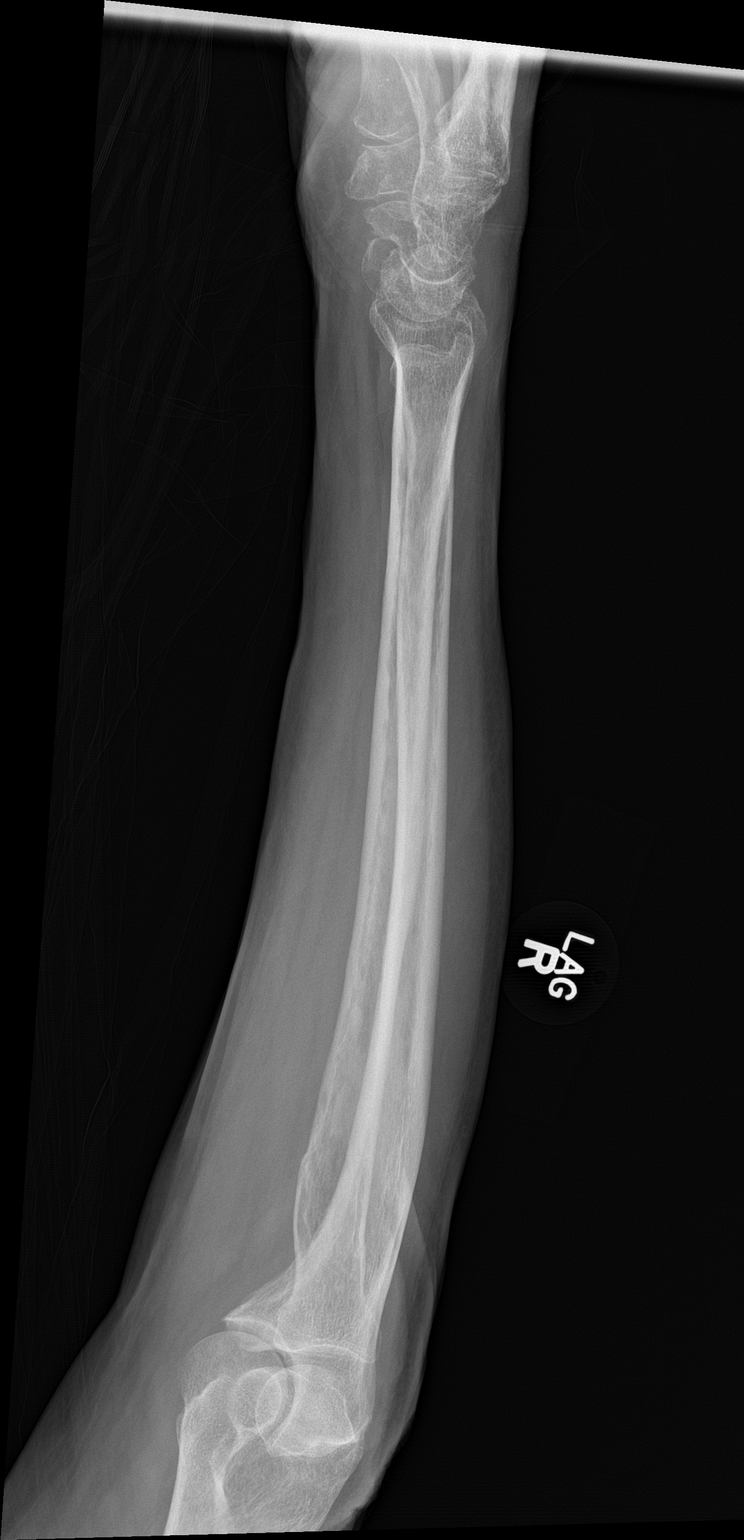

[2 of 2 positions shown; findings below may reference images not displayed]

FINDINGS: Normal alignment. No acute fracture or dislocation. Slightly month
eaten appearance of the radial and to a lesser extent ulnar
diaphysis likely relates to aggressive osteoporosis. No cortical
erosions are seen. No abnormal periosteal reaction. Soft tissues are
unremarkable.
IMPRESSION: No acute abnormality.

## 2022-05-12 IMAGING — DX DG HUMERUS 2V *R*
2 series · 2 of 2 positions shown · non-contrast
Comparison: None Available.

CLINICAL DATA: Fall, right arm injury

EXAM:
RIGHT HUMERUS - 2+ VIEW

[humerus ap]
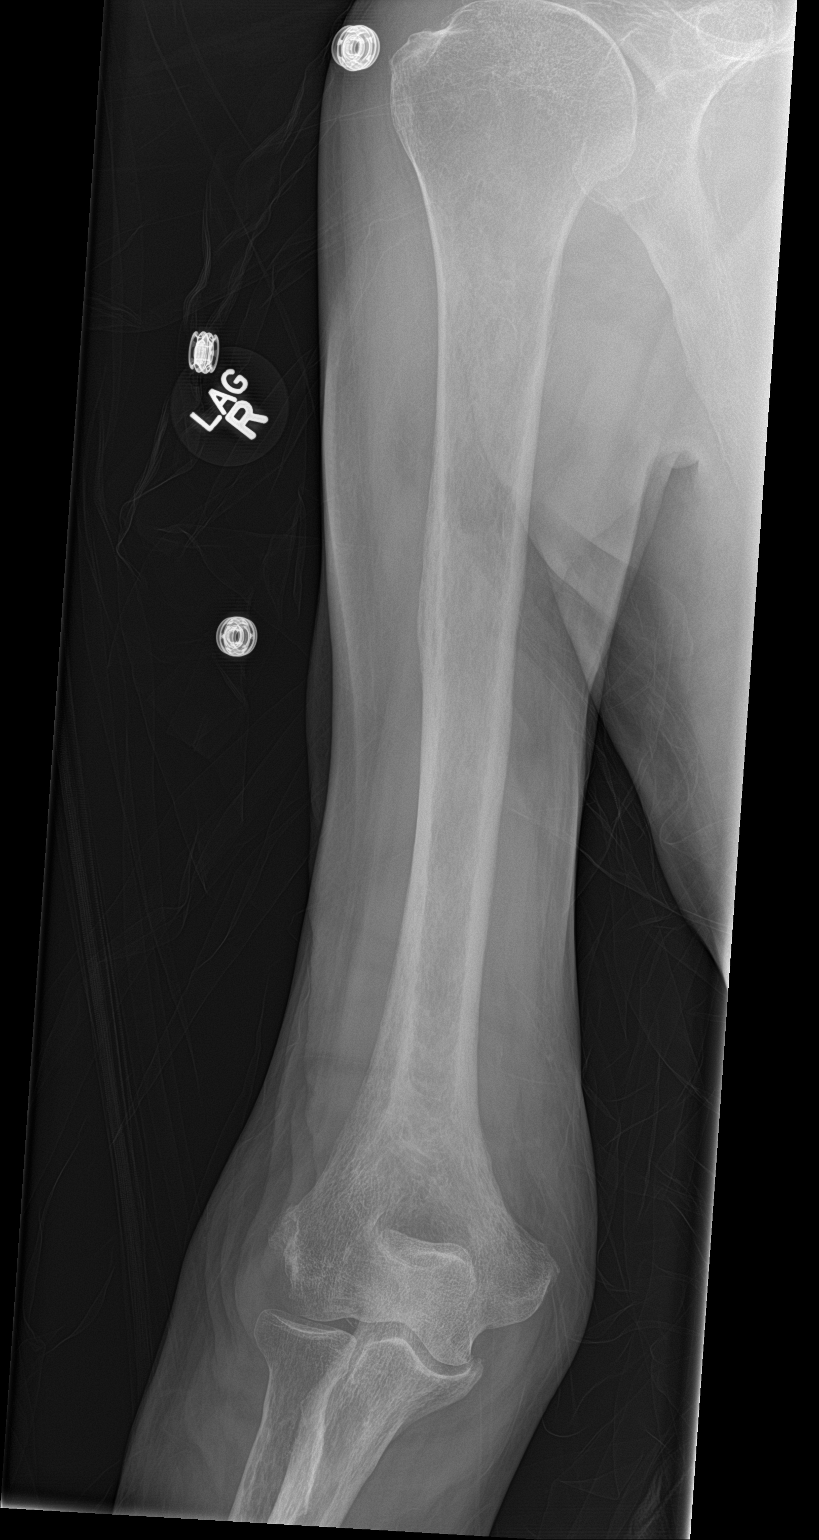

[humerus lat]
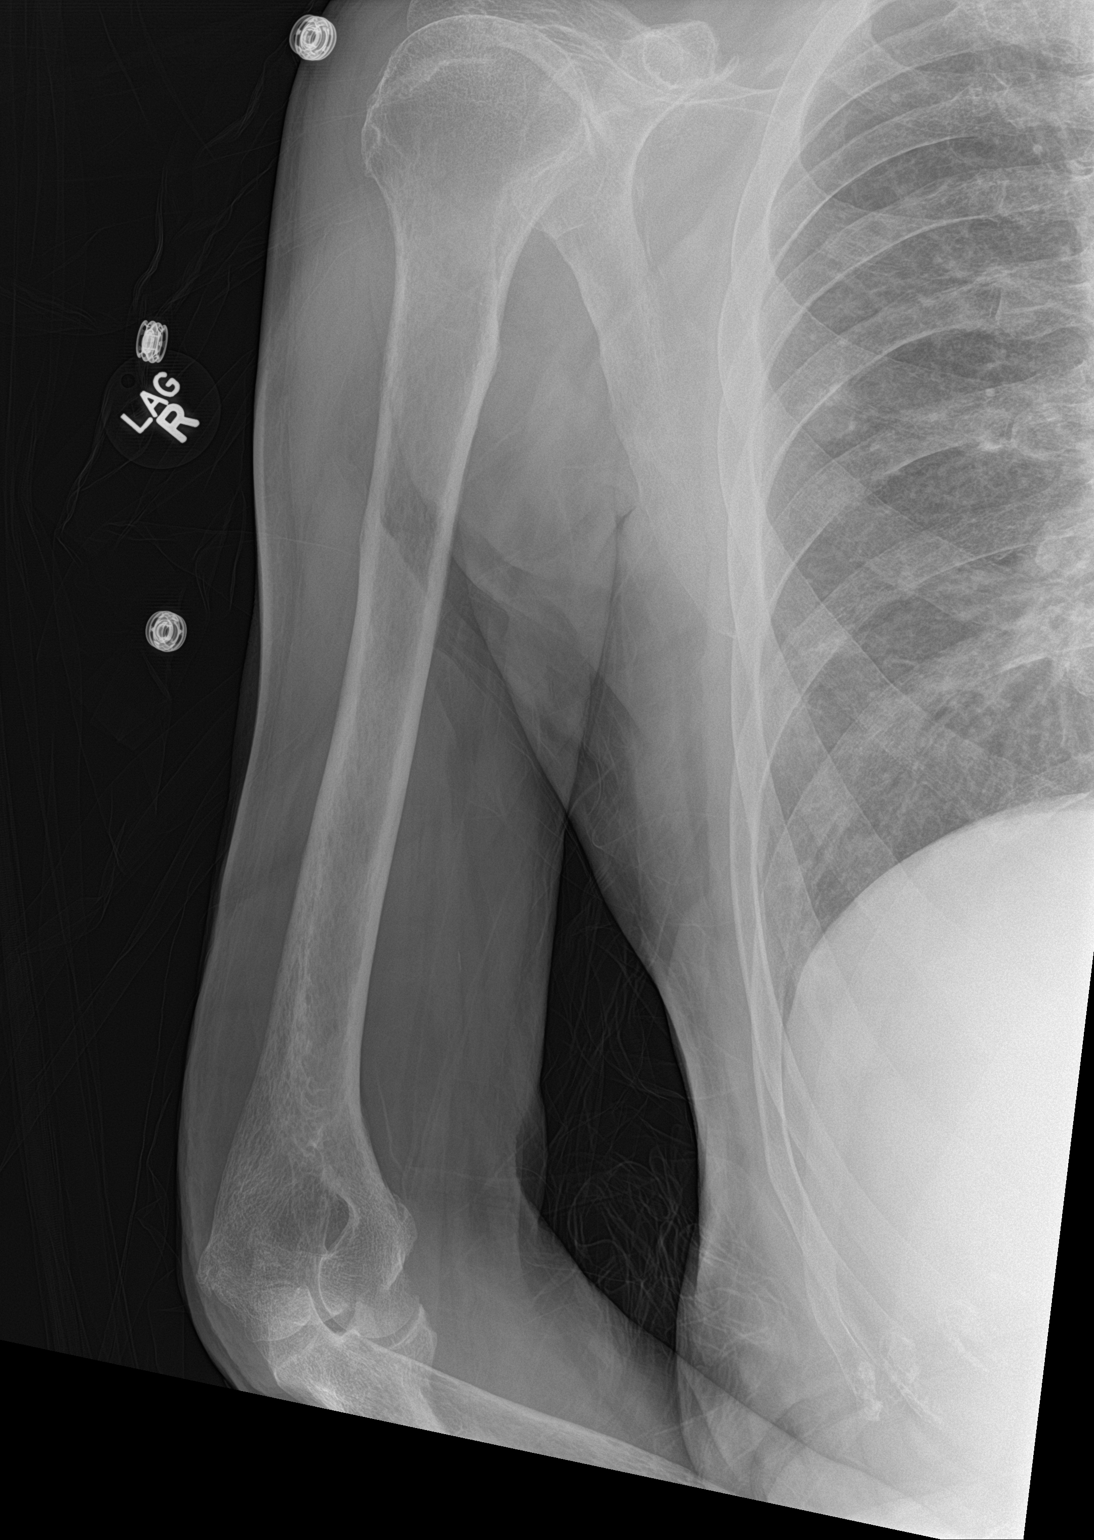

[2 of 2 positions shown; findings below may reference images not displayed]

FINDINGS: There is no evidence of fracture or other focal bone lesions. Soft
tissues are unremarkable.
IMPRESSION: Negative.

## 2022-05-12 IMAGING — CT CT HEAD W/O CM
4 series · 16 of 47 positions shown, 18 images · non-contrast
Comparison: [DATE].

CLINICAL DATA: Facial trauma, blunt.



[Series 2: head wo · axial · 0.47mm/px · z∈[-144,-24]mm · 7 of 33 slices shown, 9 images]
[im 5/33  brain]
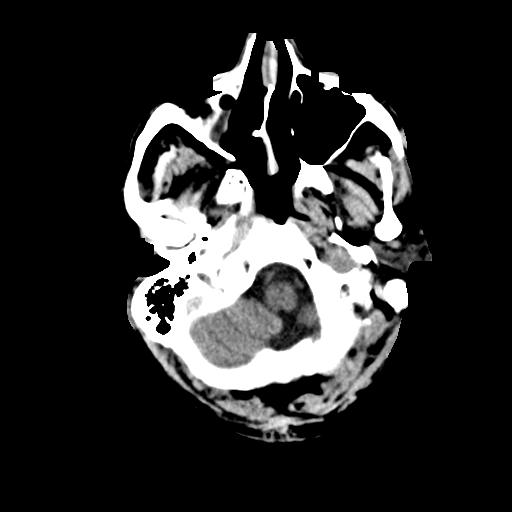
[im 5/33  bone]
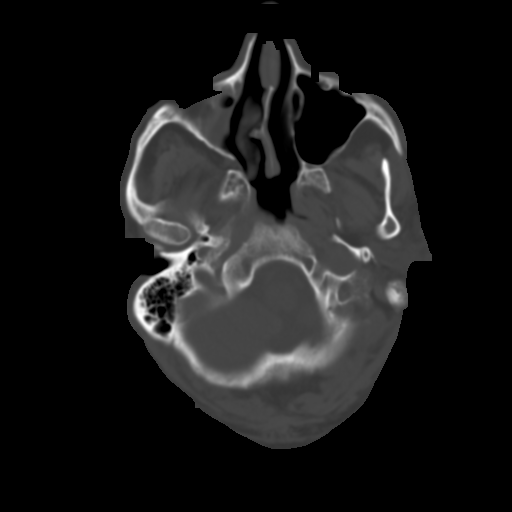
[im 9/33  brain]
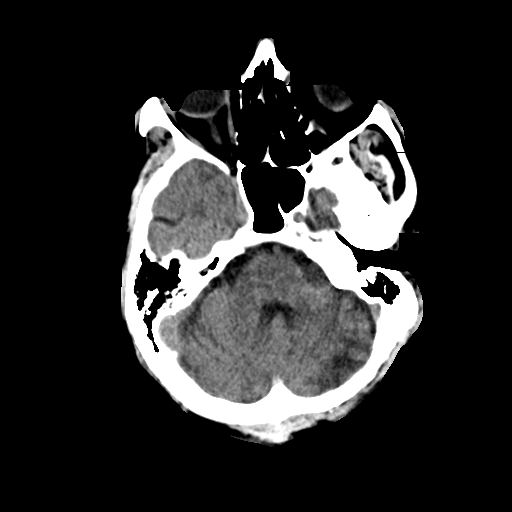
[im 13/33  brain]
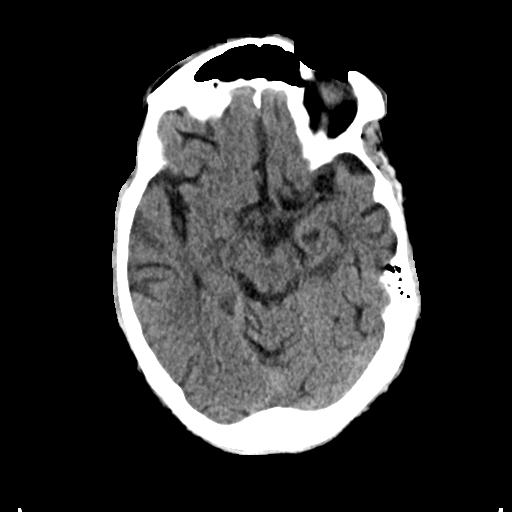
[im 17/33  brain]
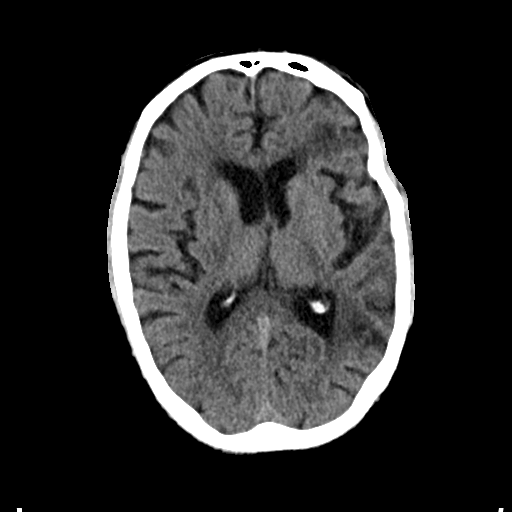
[im 21/33  brain]
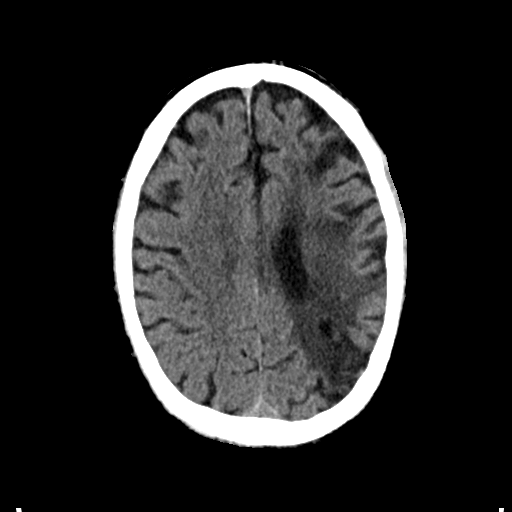
[im 21/33  bone]
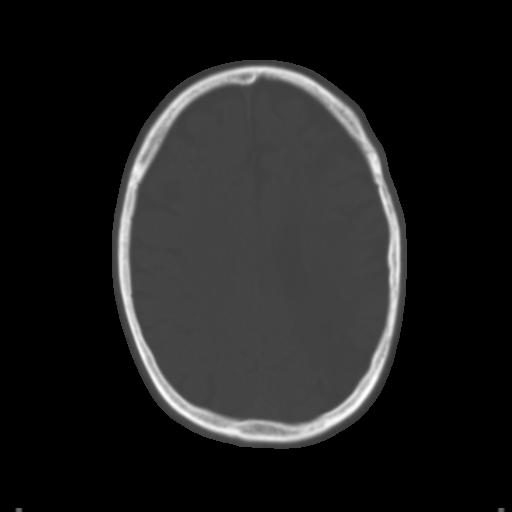
[im 25/33  brain]
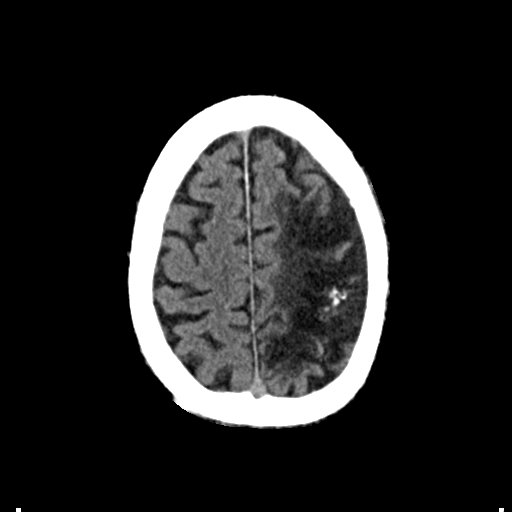
[im 29/33  brain]
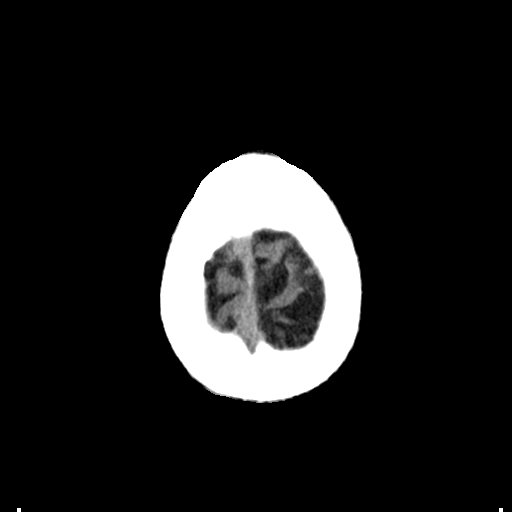

[Series 3: head bone · axial · 0.47mm/px · z∈[-148,-116]mm · 3 of 82 slices shown]
[im 9/82  bone]
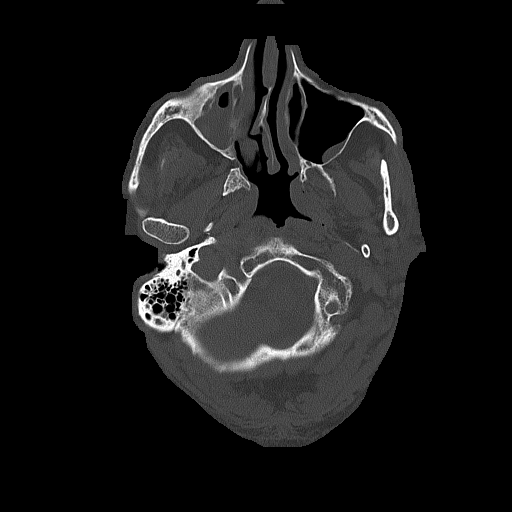
[im 17/82  bone]
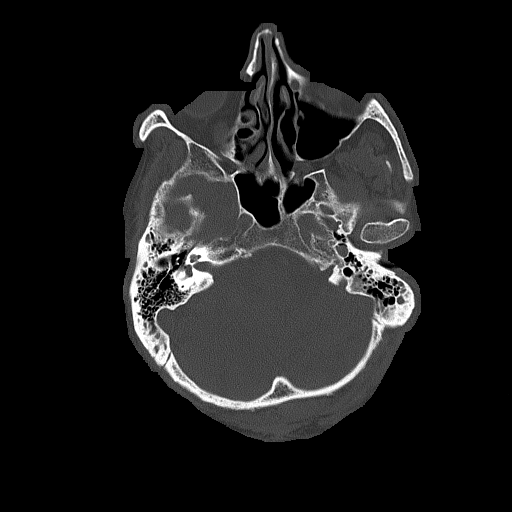
[im 25/82  bone]
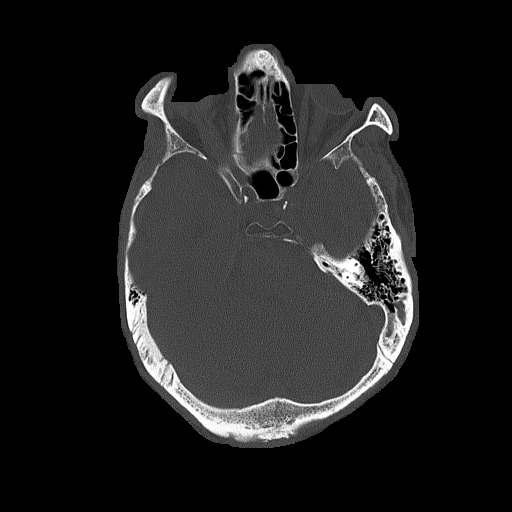

[Series 4: coronal soft tissue · coronal · 0.32mm/px · 3 of 67 slices shown]
[im 23/67  brain]
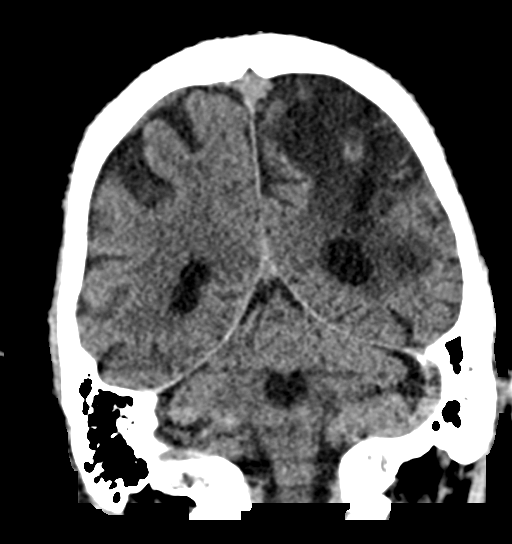
[im 30/67  brain]
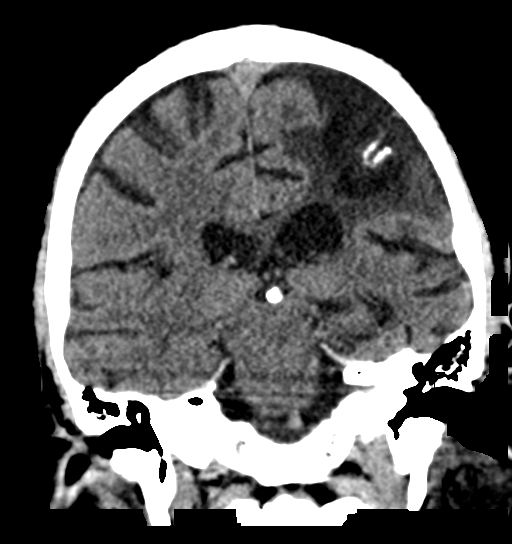
[im 37/67  brain]
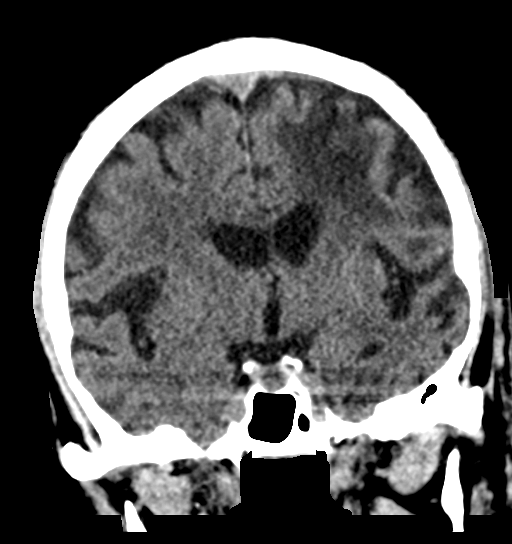

[Series 5: sagittal soft tissue · sagittal · 0.35mm/px · 3 of 51 slices shown]
[im 17/51  brain]
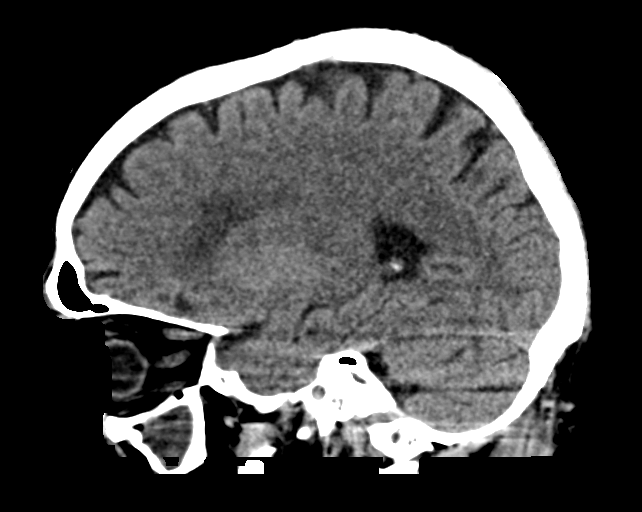
[im 26/51  brain]
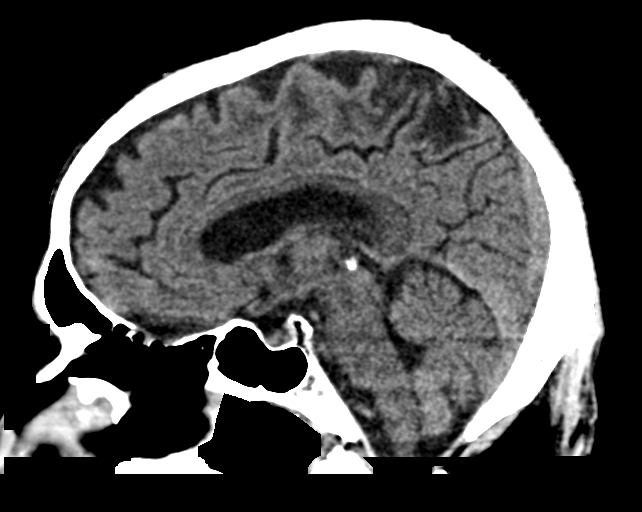
[im 34/51  brain]
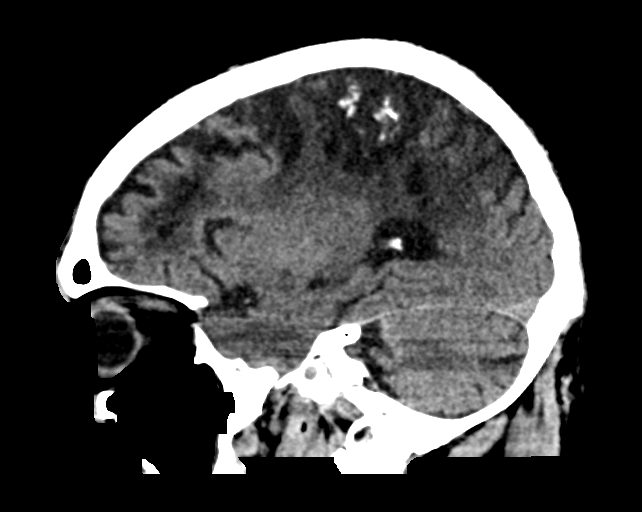

[16 of 47 positions shown; findings below may reference images not displayed]

FINDINGS: Brain: No acute intracranial hemorrhage, midline shift or mass
effect. No extra-axial fluid collection. Diffuse atrophy is noted.
An old infarct is noted in the MCA territory on the left and left
cerebellum. Periventricular white matter hypodensities are present
bilaterally. No hydrocephalus.

Vascular: No hyperdense vessel or unexpected calcification.

Skull: Normal. Negative for fracture or focal lesion.

Sinuses/Orbits: There is possible opacification of the mastoid air
cells bilaterally, unchanged from the previous exam.

Other: None.
IMPRESSION: 1. No acute intracranial hemorrhage.
2. Old infarcts in the MCA territory on the left and cerebellum on
the left.
3. Atrophy with chronic microvascular ischemic changes.

## 2022-05-12 MED ORDER — FLUDEOXYGLUCOSE F - 18 (FDG) INJECTION
8.5000 | Freq: Once | INTRAVENOUS | Status: AC
  Administered 2022-05-12: 8.5 via INTRAVENOUS

## 2022-05-12 NOTE — ED Triage Notes (Signed)
Pt fell 2 feet from porch to ground, curb. His R shoulder is lower than left and he states he cannot move it. He is on blood thinners. Possibly hit his head but has no mark or obvious hematoma. Hx of stroke.

## 2022-05-12 NOTE — ED Provider Notes (Signed)
[facility] DEPT Provider Note: Georgena Spurling, MD, FACEP  CSN: [id number] MRN: [id number] ARRIVAL: 05/12/22 at [place]: [id number]   CHIEF COMPLAINT  Fall  Level 5 caveat: Aphasia.  Nephew is the chief historian. HISTORY OF PRESENT ILLNESS  05/12/22 11:20 PM Todd Estrada is a 61 y.o. male who has right-sided weakness and expressive aphasia due to stroke.  He was coming down from steps onto a porch just prior to arrival and fell.  He fell to his right landing on his right shoulder.  It is unclear if he hit his head but he did not lose consciousness.  It was initially reported that he was having pain in his shoulder but the patient points to his right forearm.  He has a right wrist splint in place for support due to paresis of that limb.   Past Medical History:  Diagnosis Date   Asthma    COPD (chronic obstructive pulmonary disease) ([place])    Hypertension    Stroke ([place]) 02/2022    Past Surgical History:  Procedure Laterality Date   ESOPHAGOGASTRODUODENOSCOPY (EGD) WITH PROPOFOL N/A 03/11/2022   Procedure: ESOPHAGOGASTRODUODENOSCOPY (EGD) WITH PROPOFOL;  Surgeon: Georganna Skeans, MD;  Location: [facility];  Service: General;  Laterality: N/A;   IR ANGIO INTRA EXTRACRAN SEL COM CAROTID INNOMINATE UNI R MOD SED  03/02/2022   IR CT HEAD LTD  03/02/2022   IR PERCUTANEOUS ART THROMBECTOMY/INFUSION INTRACRANIAL INC DIAG ANGIO  03/02/2022   PEG PLACEMENT N/A 03/11/2022   Procedure: PERCUTANEOUS ENDOSCOPIC GASTROSTOMY (PEG) PLACEMENT;  Surgeon: Georganna Skeans, MD;  Location: [facility];  Service: General;  Laterality: N/A;   RADIOLOGY WITH ANESTHESIA N/A 03/02/2022   Procedure: IR WITH ANESTHESIA;  Surgeon: Luanne Bras, MD;  Location: [facility];  Service: Radiology;  Laterality: N/A;    Family History  Problem Relation Age of Onset   Throat cancer Mother    Liver cancer Father    Kidney failure Sister    Cancer - Lung Paternal Uncle     Social History   Tobacco Use    Smoking status: Former    Types: Cigarettes    Quit date: 05/02/2022    Years since quitting: 0.0    Passive exposure: Never   Smokeless tobacco: Never  Vaping Use   Vaping Use: Never used  Substance Use Topics   Alcohol use: Not Currently   Drug use: No    Prior to Admission medications   Medication Sig Start Date End Date Taking? Authorizing Provider  acetaminophen (TYLENOL) 325 MG tablet Take 1-2 tablets (325-650 mg total) by mouth every 4 (four) hours as needed for mild pain. 04/14/22  Yes Setzer, Edman Circle, PA-C  budesonide (PULMICORT) 0.5 MG/2ML nebulizer solution Take 2 mLs (0.5 mg total) by nebulization 2 (two) times daily. 05/04/22  Yes Freddi Starr, MD  cephALEXin (KEFLEX) 500 MG capsule 2 caps po bid x 7 days Patient taking differently: Take 1,000 mg by mouth 2 (two) times daily. Last day is tomorrow 05/06/22  Yes Margarita Mail, PA-C  clopidogrel (PLAVIX) 75 MG tablet Take 1 tablet (75 mg total) by mouth daily. 05/09/22  Yes Maudie Mercury, MD  folic acid (FOLVITE) 1 MG tablet Take 1 tablet (1 mg total) by mouth daily. 05/09/22  Yes Maudie Mercury, MD  levETIRAcetam (KEPPRA) 500 MG tablet Take 1 tablet (500 mg total) by mouth 2 (two) times daily. 05/09/22  Yes Maudie Mercury, MD  Multiple Vitamin (MULTIVITAMIN WITH MINERALS) TABS tablet Take 1 tablet by  mouth daily. 05/09/22  Yes Maudie Mercury, MD  pantoprazole (PROTONIX) 40 MG tablet Take 1 tablet (40 mg total) by mouth daily. 05/09/22  Yes Maudie Mercury, MD  thiamine 100 MG tablet Take 1 tablet (100 mg total) by mouth daily. 05/09/22  Yes Maudie Mercury, MD  Tiotropium Bromide-Olodaterol (STIOLTO RESPIMAT) 2.5-2.5 MCG/ACT AERS Inhale 2 puffs into the lungs daily. 05/04/22  Yes Freddi Starr, MD    Allergies Patient has no known allergies.   REVIEW OF SYSTEMS  Negative except as noted here or in the History of Present Illness.   PHYSICAL EXAMINATION  Initial Vital Signs Blood pressure 131/79, pulse 87,  temperature 99.1 F (37.3 C), temperature source Oral, resp. rate 18, height 6\' 1"  (1.854 m), weight 77.1 kg, SpO2 99 %.  Examination General: Well-developed, well-nourished male in no acute distress; appears much older than age of record HENT: normocephalic; no hematomas seen or palpated Eyes: pupils equal, round and reactive to light; extraocular muscles intact Neck: supple; nontender Heart: regular rate and rhythm Lungs: clear to auscultation bilaterally Abdomen: soft; nondistended; nontender; bowel sounds present Extremities: No obvious acute deformity; right wrist in Velcro splint; no pain on passive movement of limbs Neurologic: Awake, alert; expressive aphasia; right hemiparesis Skin: Warm and dry Psychiatric: Flat affect   RESULTS  Summary of this visit's results, reviewed and interpreted by myself:   EKG Interpretation  Date/Time:    Ventricular Rate:    PR Interval:    QRS Duration:   QT Interval:    QTC Calculation:   R Axis:     Text Interpretation:         Laboratory Studies: Results for orders placed or performed during the hospital encounter of 05/12/22 (from the past 24 hour(s))  Glucose, capillary     Status: Abnormal   Collection Time: 05/12/22  8:24 AM  Result Value Ref Range   Glucose-Capillary 118 (H) 70 - 99 mg/dL   Imaging Studies: DG Shoulder Right  Result Date: 05/13/2022 CLINICAL DATA:  Fall, right shoulder pain EXAM: RIGHT SHOULDER - 2+ VIEW COMPARISON:  None Available. FINDINGS: Normal alignment. No acute fracture or dislocation. Moderate degenerative arthritis of the right acromioclavicular joint. Glenohumeral joint space is not well profiled. 14 mm nodular opacity seen within the peripheral right mid lung zone IMPRESSION: No acute fracture or dislocation. Moderate acromioclavicular degenerative arthritis. Spiculated 14 mm nodule within the peripheral right mid lung zone. Electronically Signed   By: Fidela Salisbury M.D.   On: 05/13/2022 00:22    DG Forearm Right  Result Date: 05/13/2022 CLINICAL DATA:  Fall, right arm pain EXAM: RIGHT FOREARM - 2 VIEW COMPARISON:  None Available. FINDINGS: Normal alignment. No acute fracture or dislocation. 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 No cortical erosions are seen. No abnormal periosteal reaction. Soft tissues are unremarkable. IMPRESSION: No acute abnormality. Electronically Signed   By: Fidela Salisbury M.D.   On: 05/13/2022 00:24   CT Head Wo Contrast  Result Date: 05/13/2022 CLINICAL DATA:  Facial trauma, blunt. EXAM: CT HEAD WITHOUT CONTRAST TECHNIQUE: Contiguous axial images were obtained from the base of the skull through the vertex without intravenous contrast. RADIATION DOSE REDUCTION: This exam was performed according to the departmental dose-optimization program which includes automated exposure control, adjustment of the mA and/or kV according to patient size and/or use of iterative reconstruction technique. COMPARISON:  03/05/2022. FINDINGS: Brain: No acute intracranial hemorrhage, midline shift or  mass effect. No extra-axial fluid collection. Diffuse atrophy is noted. An old infarct is noted in the MCA territory on the left and left cerebellum. Periventricular white matter hypodensities are present bilaterally. No hydrocephalus. Vascular: No hyperdense vessel or unexpected calcification. Skull: Normal. Negative for fracture or focal lesion. Sinuses/Orbits: There is possible opacification of the mastoid air cells bilaterally, unchanged from the previous exam. Other: None. IMPRESSION: 1. No acute intracranial hemorrhage. 2. Old infarcts in the MCA territory on the left and cerebellum on the left. 3. Atrophy with chronic microvascular ischemic changes. Electronically Signed   By: Brett Fairy M.D.   On: 05/13/2022 00:00   DG Humerus Right  Result Date: 05/13/2022 CLINICAL DATA:  Fall, right arm injury  EXAM: RIGHT HUMERUS - 2+ VIEW COMPARISON:  None Available. FINDINGS: There is no evidence of fracture or other focal bone lesions. Soft tissues are unremarkable. IMPRESSION: Negative. Electronically Signed   By: Fidela Salisbury M.D.   On: 05/13/2022 00:20    ED COURSE and MDM  Nursing notes, initial and subsequent vitals signs, including pulse oximetry, reviewed and interpreted by myself.  Vitals:   05/12/22 2305 05/12/22 2320  BP: 131/79 138/85  Pulse: 87 79  Resp: 18 19  Temp: 99.1 F (37.3 C)   TempSrc: Oral   SpO2: 99% 99%  Weight: 77.1 kg   Height: 6\' 1"  (1.854 m)    Medications - No data to display  No evidence of significant injury on physical exam nor on radiographs.  I was not able to appreciate any asymmetry in the patient's shoulders although the triage note states the right shoulder was apparently lower than the left shoulder.  This may have been due to patient positioning.  PROCEDURES  Procedures   ED DIAGNOSES     ICD-10-CM   1. Fall in home, initial encounter  W19.XXXA    Y92.009          Shanon Rosser, MD 05/13/22 [phone]

## 2022-05-13 NOTE — ED Notes (Signed)
Discharge paperwork reviewed with pt. And caregiver. Caregiver showed verbal understanding of paperwork. Pt. Was assisted into the wheelchair an left ED with caregiver.

## 2022-05-17 DIAGNOSIS — R1312 Dysphagia, oropharyngeal phase: Secondary | ICD-10-CM | POA: Diagnosis not present

## 2022-05-17 DIAGNOSIS — I2510 Atherosclerotic heart disease of native coronary artery without angina pectoris: Secondary | ICD-10-CM

## 2022-05-17 DIAGNOSIS — I69391 Dysphagia following cerebral infarction: Secondary | ICD-10-CM | POA: Diagnosis not present

## 2022-05-17 DIAGNOSIS — J439 Emphysema, unspecified: Secondary | ICD-10-CM

## 2022-05-17 DIAGNOSIS — I69351 Hemiplegia and hemiparesis following cerebral infarction affecting right dominant side: Secondary | ICD-10-CM | POA: Diagnosis not present

## 2022-05-17 DIAGNOSIS — J9611 Chronic respiratory failure with hypoxia: Secondary | ICD-10-CM

## 2022-05-17 DIAGNOSIS — I69320 Aphasia following cerebral infarction: Secondary | ICD-10-CM | POA: Diagnosis not present

## 2022-05-17 DIAGNOSIS — J9811 Atelectasis: Secondary | ICD-10-CM

## 2022-05-17 DIAGNOSIS — Z9181 History of falling: Secondary | ICD-10-CM

## 2022-05-17 DIAGNOSIS — Z7902 Long term (current) use of antithrombotics/antiplatelets: Secondary | ICD-10-CM

## 2022-05-17 DIAGNOSIS — Z7982 Long term (current) use of aspirin: Secondary | ICD-10-CM

## 2022-05-17 DIAGNOSIS — I10 Essential (primary) hypertension: Secondary | ICD-10-CM

## 2022-05-17 DIAGNOSIS — Z87440 Personal history of urinary (tract) infections: Secondary | ICD-10-CM

## 2022-05-17 DIAGNOSIS — E785 Hyperlipidemia, unspecified: Secondary | ICD-10-CM

## 2022-05-17 DIAGNOSIS — I700 Atherosclerosis of aorta: Secondary | ICD-10-CM

## 2022-05-17 DIAGNOSIS — I739 Peripheral vascular disease, unspecified: Secondary | ICD-10-CM

## 2022-05-17 DIAGNOSIS — J441 Chronic obstructive pulmonary disease with (acute) exacerbation: Secondary | ICD-10-CM

## 2022-05-18 ENCOUNTER — Ambulatory Visit (INDEPENDENT_AMBULATORY_CARE_PROVIDER_SITE_OTHER): Payer: Medicaid Other | Admitting: Podiatry

## 2022-05-18 ENCOUNTER — Encounter: Payer: Self-pay | Admitting: Podiatry

## 2022-05-18 ENCOUNTER — Telehealth: Payer: Self-pay | Admitting: Pulmonary Disease

## 2022-05-18 DIAGNOSIS — I739 Peripheral vascular disease, unspecified: Secondary | ICD-10-CM

## 2022-05-18 DIAGNOSIS — M79674 Pain in right toe(s): Secondary | ICD-10-CM

## 2022-05-18 DIAGNOSIS — B351 Tinea unguium: Secondary | ICD-10-CM | POA: Diagnosis not present

## 2022-05-18 DIAGNOSIS — M79675 Pain in left toe(s): Secondary | ICD-10-CM

## 2022-05-18 DIAGNOSIS — D689 Coagulation defect, unspecified: Secondary | ICD-10-CM

## 2022-05-18 NOTE — Progress Notes (Signed)
  Subjective:  Patient ID: Todd Estrada, male    DOB: 10/17/1961,   MRN: [id number]  Chief Complaint  Patient presents with   Nail Problem     Routine foot care    61 y.o. male presents for concern of thickened elongated and painful nails that are difficult to trim. Requesting to have them trimmed today. Relates burning and tingling in their feet. Patient is not diabetic but with history of PAD and currently on blood thinner which puts him at risk for foot care.   Marland Kitchen   PCP:  Pcp, No    . Denies any other pedal complaints. Denies n/v/f/c.   Past Medical History:  Diagnosis Date   Asthma    COPD (chronic obstructive pulmonary disease) ([place])    Hypertension    Stroke ([place]) 02/2022    Objective:  Physical Exam: Vascular: DP/PT pulses 1/4 bilateral. CFT <3 seconds. Absent hair growth on digits. Edema noted to bilateral lower extremities. Xerosis noted bilaterally.  Skin. No lacerations or abrasions bilateral feet. Nails 1-5 bilateral  are thickened discolored and elongated with subungual debris.  Musculoskeletal: MMT 5/5 bilateral lower extremities in DF, PF, Inversion and Eversion. Deceased ROM in DF of ankle joint.  Neurological: Sensation intact to light touch. Protective sensation intact  bilateral.     Assessment:   1. Pain due to onychomycosis of toenails of both feet   2. Coagulation defect ([place])   3. PAD (peripheral artery disease) ([place])      Plan:  Patient was evaluated and treated and all questions answered. -Discussed and educated patient on foot care, especially with  regards to the vascular, neurological and musculoskeletal systems.  -Discussed supportive shoes at all times and checking feet regularly.  -Mechanically debrided all nails 1-5 bilateral using sterile nail nipper and filed with dremel without incident  -Answered all patient questions -Patient to return  in 3 months for at risk foot care -Patient advised to call the office if any problems or questions  arise in the meantime.    Lorenda Peck, DPM

## 2022-05-18 NOTE — Telephone Encounter (Signed)
I spoke with patient's cousin, Masud Holub, about the PET scan results and that they are concerning for malignancy. We discussed the potential need for bronchoscopy at last visit and [name] would like to move forward with scheduling the navigational bronchoscopy for biopsies. I instructed him that plavix would need to be held 5 days prior to the procedure and that our team would let him know further instructions once the procedure is scheduled.   Freda Jackson, MD [facility] Office: [phone]

## 2022-05-19 ENCOUNTER — Telehealth: Payer: Self-pay | Admitting: *Deleted

## 2022-05-19 ENCOUNTER — Other Ambulatory Visit: Payer: Self-pay | Admitting: Emergency Medicine

## 2022-05-19 ENCOUNTER — Ambulatory Visit (HOSPITAL_COMMUNITY): Payer: Medicaid Other

## 2022-05-19 DIAGNOSIS — R918 Other nonspecific abnormal finding of lung field: Secondary | ICD-10-CM

## 2022-05-19 MED ORDER — ADULT MULTIVITAMIN W/MINERALS CH: 1.0000 | ORAL_TABLET | Freq: Every day | ORAL | 0 refills | Status: DC

## 2022-05-19 NOTE — Addendum Note (Signed)
Addended by: Maudie Mercury C on: 05/19/2022 02:27 PM   Modules accepted: Orders

## 2022-05-19 NOTE — Telephone Encounter (Signed)
Todd Estrada PT with [facility] called to extend his PT plan of care ([place]) 1wk3 beginning 05/22/22. He also has developed a right foot drop and is going to cause him to fall and is requesting we send an RX to [facility] for a Right AFO.  I will call and ok the PT extension and if ok I can write the Rx to [facility] and stamp your signature for the right AFO and fax to [place].Marland Kitchen

## 2022-05-19 NOTE — Telephone Encounter (Signed)
Received call from [place], [place] with [facility]. Requesting VO to continue [place] 1 week 4 to work on CVA management, med management, and safety. Verbal auth given. Will route to Red Team for agreement/denial.  Todd Estrada made aware all meds sent to [facility] on [address]. Patient's caregiver prefers [facility]. He will call [facility] to get Rxs transferred from [place]. [place] removed from list.

## 2022-05-19 NOTE — Progress Notes (Signed)
Request entered to arrange for robotic nav, goal 05/26/22 or 05/30/22 Also ordered super d Ct

## 2022-05-19 NOTE — Telephone Encounter (Signed)
Orders faxed and approval given for PT extension.

## 2022-05-20 ENCOUNTER — Encounter (HOSPITAL_BASED_OUTPATIENT_CLINIC_OR_DEPARTMENT_OTHER): Payer: Medicaid Other | Attending: General Surgery | Admitting: General Surgery

## 2022-05-25 ENCOUNTER — Telehealth: Payer: Self-pay | Admitting: Student

## 2022-05-25 ENCOUNTER — Telehealth: Payer: Self-pay | Admitting: *Deleted

## 2022-05-25 MED ORDER — THIAMINE HCL 100 MG PO TABS: 100.0000 mg | ORAL_TABLET | Freq: Every day | ORAL | 2 refills | Status: DC

## 2022-05-25 MED ORDER — ATORVASTATIN CALCIUM 40 MG PO TABS: 40.0000 mg | ORAL_TABLET | Freq: Every day | ORAL | 3 refills | Status: DC

## 2022-05-25 NOTE — Telephone Encounter (Signed)
Call from [place] with [facility]. Stated she saw pt yesterday and his HR was 96 and irregular/skipping beats;pt was asymptomatic. Also stated pt needs refill on Thiamine and Atorvastatin 40 mg which is not on current med list. Pharmacy is [facility] on [address]. Thanks

## 2022-05-25 NOTE — Telephone Encounter (Signed)
Called patient and spoke with son [name].  States patient is completely out of his thiamine and atorvastatin.  He reports that nurse mention something about patient's heart skipping a beat, however patient is feeling okay.  They also informed him that patient blood pressure drops slightly when he stands up.  Advised him to ensure patient drinks plenty of fluids to stay hydrated.  Instructed him to call office if patient starts having symptoms such as dizziness, presyncope, chest pain, shortness of breath or palpitations. Patient is scheduled for bronchoscopy with biopsy on 6/12 with Dr. Lamonte Sakai.  -Ordered atorvastatin 40 mg daily and refilled thiamine 100 mg daily

## 2022-05-25 NOTE — Telephone Encounter (Signed)
Called and spoke with patient's son.  I have sent the refills to patient's pharmacy.

## 2022-05-27 ENCOUNTER — Ambulatory Visit (HOSPITAL_COMMUNITY)
Admission: RE | Admit: 2022-05-27 | Discharge: 2022-05-27 | Disposition: A | Discharge status: Home / Self Care | Payer: Medicaid Other | Source: Ambulatory Visit | Attending: Emergency Medicine | Admitting: Emergency Medicine

## 2022-05-27 ENCOUNTER — Other Ambulatory Visit: Payer: Self-pay | Admitting: Emergency Medicine

## 2022-05-27 ENCOUNTER — Encounter (HOSPITAL_COMMUNITY): Payer: Self-pay | Admitting: Emergency Medicine

## 2022-05-27 DIAGNOSIS — R918 Other nonspecific abnormal finding of lung field: Secondary | ICD-10-CM | POA: Insufficient documentation

## 2022-05-27 LAB — SARS CORONAVIRUS 2 (TAT 6-24 HRS): SARS Coronavirus 2: NEGATIVE

## 2022-05-27 IMAGING — CT CT CHEST SUPER D W/O CM
2 of 4 series · 14 of 36 positions shown, 17 images · non-contrast
Comparison: Multiple priors, most recently PET-CT [DATE].

CLINICAL DATA: 60-year-old male with history of lung mass.
Follow-up study.

EXAM:
CT CHEST WITHOUT CONTRAST
TECHNIQUE: Multidetector CT imaging of the chest was performed using thin slice
collimation for electromagnetic bronchoscopy planning purposes,
without intravenous contrast.
RADIATION DOSE REDUCTION: This exam was performed according to the
departmental dose-optimization program which includes automated
exposure control, adjustment of the mA and/or kV according to
patient size and/or use of iterative reconstruction technique.

[Series 3: chest wo · axial · 0.79mm/px · z∈[+1260,+1584]mm · 11 of 192 slices shown, 14 images]
[im 15/192  mediastinal]
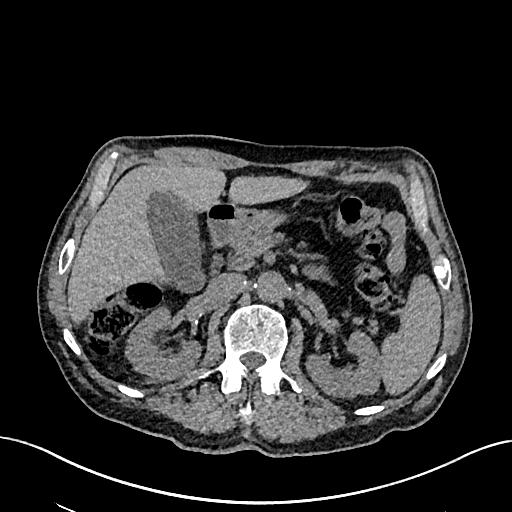
[im 15/192  lung]
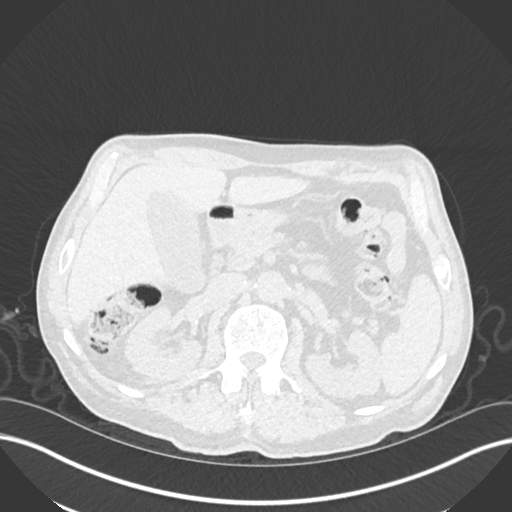
[im 30/192  lung]
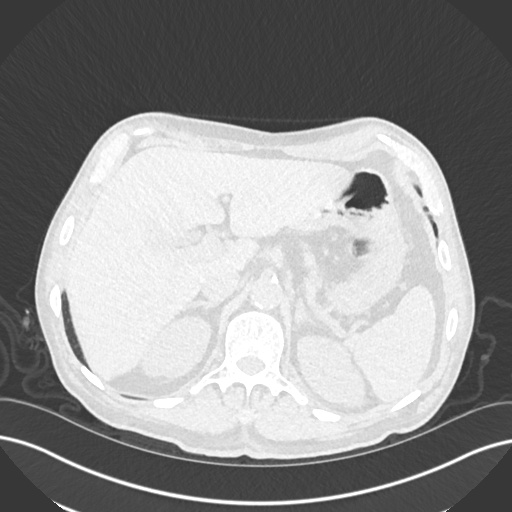
[im 45/192  lung]
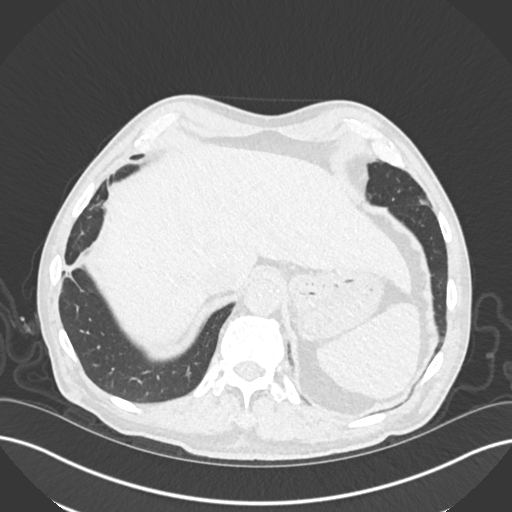
[im 59/192  lung]
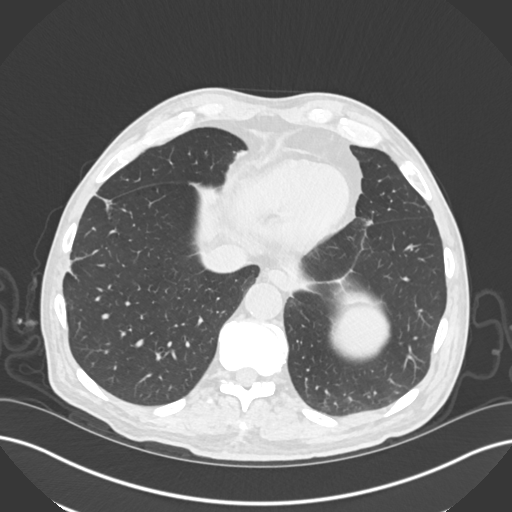
[im 74/192  mediastinal]
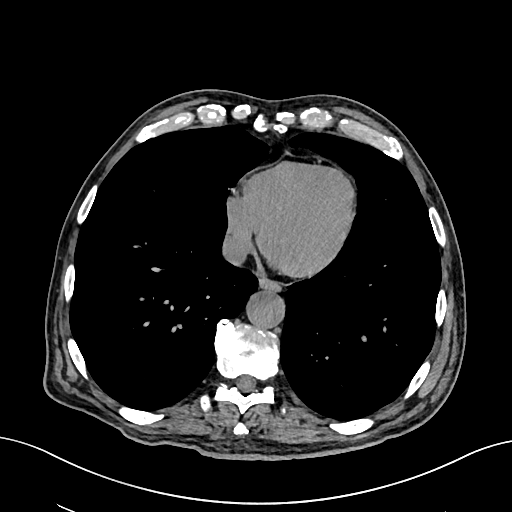
[im 74/192  lung]
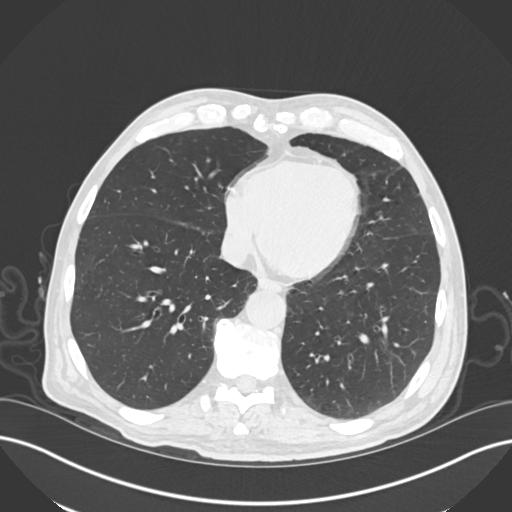
[im 103/192  lung]
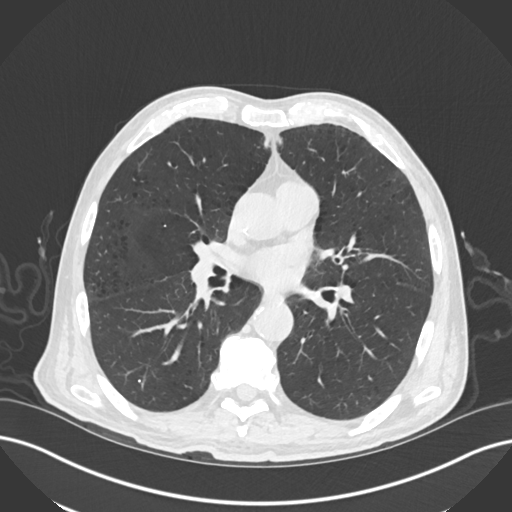
[im 118/192  lung]
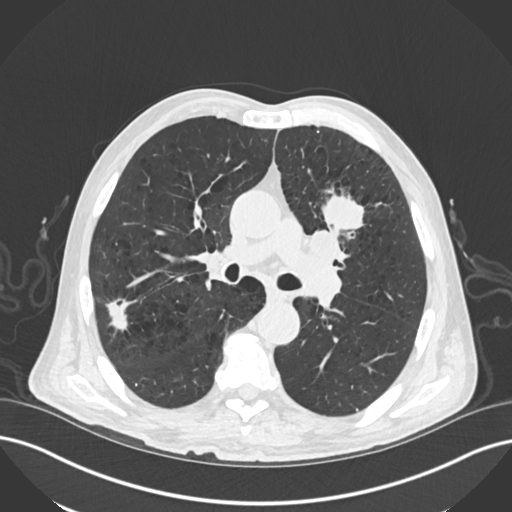
[im 133/192  lung]
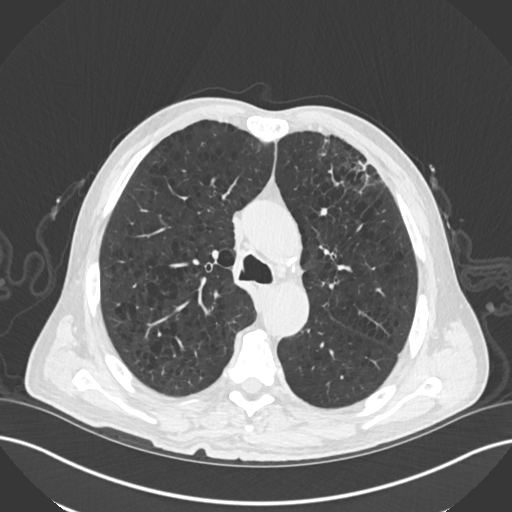
[im 147/192  mediastinal]
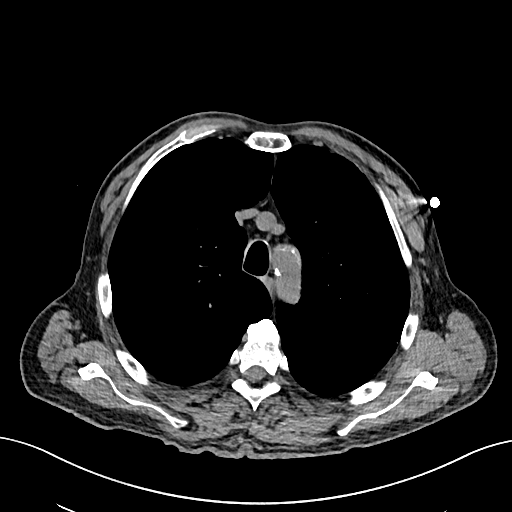
[im 147/192  lung]
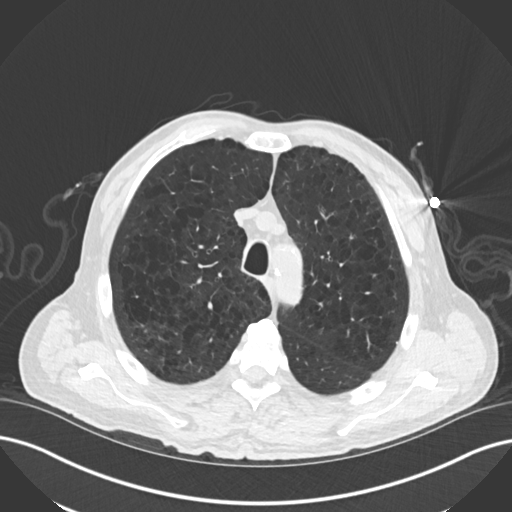
[im 162/192  lung]
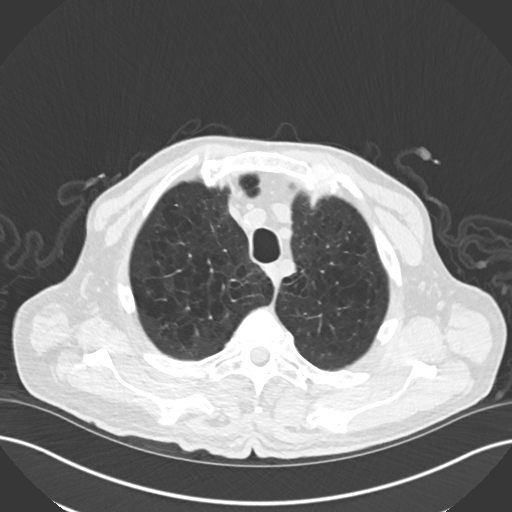
[im 177/192  lung]
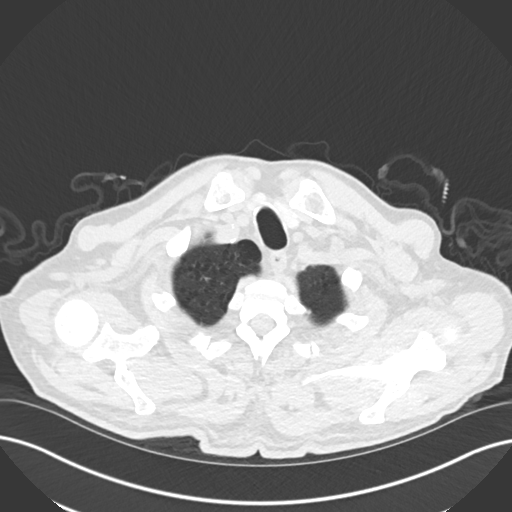

[Series 6: cor · coronal · 0.79mm/px · 3 of 150 slices shown]
[im 30/150  lung]
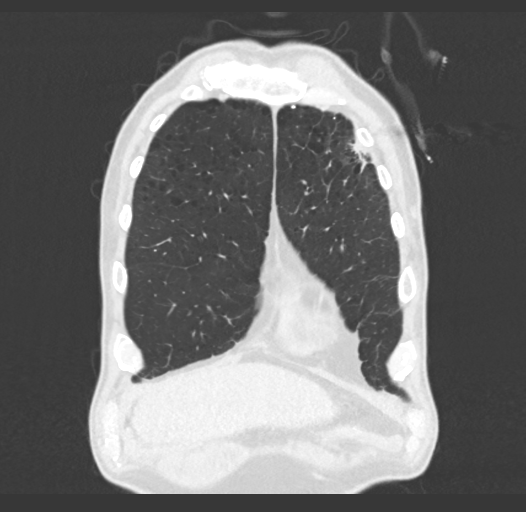
[im 60/150  lung]
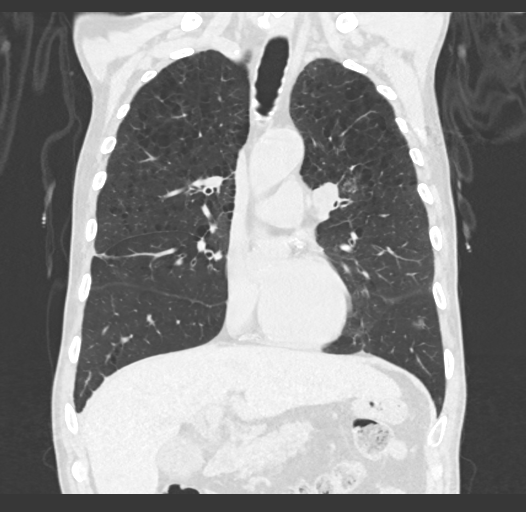
[im 90/150  lung]
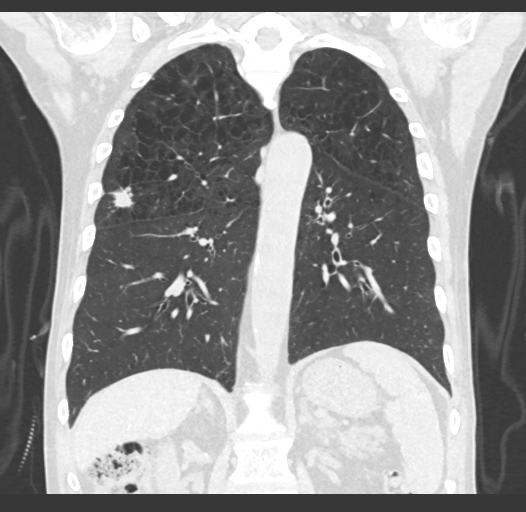

[14 of 36 positions shown; findings below may reference images not displayed]

FINDINGS: Cardiovascular: Heart size is normal. There is no significant
pericardial fluid, thickening or pericardial calcification. There is
aortic atherosclerosis, as well as atherosclerosis of the great
vessels of the mediastinum and the coronary arteries, including
calcified atherosclerotic plaque in the left main, left anterior
descending, left circumflex and right coronary arteries. Mild
calcifications of the aortic valve.

Mediastinum/Nodes: Enlarged left hilar lymph node (axial image 75 of
series 3) measuring up to 2 cm in short axis. No definite
pathologically enlarged mediastinal or right hilar lymphadenopathy
confidently identified on today's noncontrast CT examination.
Esophagus is unremarkable in appearance. No axillary
lymphadenopathy.

Lungs/Pleura: In the central aspect of the left upper lobe (axial
image 71 of series 3 and coronal image 51 of series 6) there is a
macrolobulated slightly spiculated lesion measuring 3.7 x 3.3 x
cm. Increasingly conspicuous area of architectural distortion in the
periphery of the left upper lobe distal to this mass, which may
reflect evolving postobstructive changes, or potentially some
peripheral lymphangitic spread of tumor, as this is rather nodular
in appearance, particularly medially where the largest nodular
component currently measures 1.1 x 1.0 cm (axial image 57 of series
4). An additional aggressive appearing nodule in the posterior
aspect of the right upper lobe (axial image 77 of series 4 and
coronal image 89 of series 6) is also noted with macrolobulated and
slightly spiculated margins measuring 2.0 x 1.5 x 1.3 cm. 7 x 3 mm
(mean diameter of 5 mm) right lower lobe pulmonary nodule (axial
image 123 of series 4), unchanged. A few scattered calcified
granulomas are noted in the lungs. No acute consolidative airspace
disease. No pleural effusions. Diffuse bronchial wall thickening
with moderate centrilobular and paraseptal emphysema.

Upper Abdomen: Aortic atherosclerosis.

Musculoskeletal: There are no aggressive appearing lytic or blastic
lesions noted in the visualized portions of the skeleton.
IMPRESSION: 1. Left upper lobe mass and right upper lobe pulmonary nodules are
similar to prior examinations and both maintain an aggressive
appearance, suggestive of synchronous bilateral lung neoplasms.
Associated left hilar lymphadenopathy previously demonstrated to be
hypermetabolic is suspicious for nodal metastasis. Findings in the
periphery of the left upper lobe could suggest worsening
postobstructive changes or evolving peripheral lymphangitic spread
of tumor.
2. Diffuse bronchial wall thickening with moderate centrilobular and
paraseptal emphysema; imaging findings suggestive of underlying
COPD.
3. Aortic atherosclerosis, in addition to left main and three-vessel
coronary artery disease. Please note that although the presence of
coronary artery calcium documents the presence of coronary artery
disease, the severity of this disease and any potential stenosis
cannot be assessed on this non-gated CT examination. Assessment for
potential risk factor modification, dietary therapy or pharmacologic
therapy may be warranted, if clinically indicated.

Aortic Atherosclerosis ([G9]-[G9]) and Emphysema ([G9]-[G9]).

## 2022-05-27 NOTE — Progress Notes (Signed)
Anesthesia Chart Review: Todd Estrada  Case: [id number] Date/Time: 05/30/22 0730   Procedure: ROBOTIC ASSISTED NAVIGATIONAL BRONCHOSCOPY   Anesthesia type: Monitor Anesthesia Care   Pre-op diagnosis: bilateral pulm nodule   Location: [facility] / [facility]   Surgeons: Collene Gobble, MD       DISCUSSION: Patient is a 61 year old male scheduled for the above procedure.  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 Reportedly prior heavy alcohol intake, not currently.   [facility] admission 03/02/22-03/21/22 for seizure-like activity with CTA showing left ICA occlusion, s/p endovascular revascularization thrombectomy/angioplasty. EEG more suggestive of cortical dysfunction secondary to underlying CVA. Also septic shock from aspiration PNA. Intubated 03/03/22 for airway protection, ultimately required beside percutaneous tracheostomy 03/09/22, PEG 03/11/22. Started on Trach collar 03/13/22. Echo showed LVEF 40-45% (unchanged from 08/19/21), no regional wall motion abnormalities. CT showed BUL spiculated lung nodules with out-patient follow-up planned. Discharged to CIR 03/22/22 - 04/15/22. Tracheostomy decannulated 04/11/22. D2 diet 04/13/22, PEG removed 04/20/22.    He has since had pulmonology follow-up with Dr. Erin Fulling. Patient had quit smoking. He continued Stiolto and budesonide for emphysema. March 2023 CT results discussed and concerning for lung cancer given his smoking history. PET Scan ordered, and ultimately the above procedure recommended for tissue diagnosis and to guide management. He advised holding Plavix for 5 days prior to procedure.   Neurologist signed off on 03/18/22 with plans for Plavix and ASA x 3 months post CVA then ASA alone. He has  not had any neurology out-patient follow-up since March CVA, but he has been seen by primary care through the [facility]. 05/25/22 provider notation indicates they are aware of surgery plans. He is not followed by cardiology. EF 40-45% by 08/2021 and 04/2022 echo, no regional wall motion abnormalities. No significant ST/T wave changes by 08/20/21 EKG (last EKG seen).   Reviewed above with anesthesiologist Myrtie Soman, MD. Patient with suspected lung cancer and needs tissue diagnosis. Anesthesia team to evaluate on the day of surgery. Super D Chest CT done on 05/27/22 is still in process.    VS:  BP Readings from Last 3 Encounters:  05/13/22 111/76  05/09/22 (!) 91/58  05/06/22 112/77   Pulse Readings from Last 3 Encounters:  05/13/22 72  05/09/22 86  05/06/22 95     PROVIDERS: [name], MD is listed as PCP. He receives primary care though the [facility]. - Orlie Pollen, MD is vascular surgeon. Last visit 04/29/22 for PAD. Foot wounds had healed. One year follow-up planned. Carotid disease follow-up per IR - Luanne Bras, MD is IR. Last encounter seen 03/02/22.  Freda Jackson, MD is pulmonologist - Jennye Boroughs, MD is PM&R. Last visit 04/28/22.  Antony Contras, MD is neurologist. Last encounter seen is from 03/18/22. Continue aspirin and Plavix for 3 months followed by aspirin alone and aggressive risk factor modification.    LABS: Most recent lab results include: Lab Results  Component Value Date   WBC 16.0 (H) 05/06/2022   HGB 14.0 05/06/2022   HCT 43.4 05/06/2022   PLT 265 05/06/2022   GLUCOSE 143 (H) 05/06/2022   ALT 13 05/06/2022   AST 19 05/06/2022   NA 137 05/06/2022   K 3.6 05/06/2022   CL 104  05/06/2022   CREATININE 0.79 05/06/2022   BUN 10 05/06/2022   CO2 25 05/06/2022   HGBA1C 5.4 03/03/2022    EEG 03/03/22: IMPRESSION: This study is suggestive of cortical dysfunction in left hemisphere  likely secondary to underlying stroke. No seizures or definite epileptiform discharges were seen throughout the recording.    IMAGES: CT Super D Chest 05/27/22: In process.  PET Scan 05/12/22: IMPRESSION: 1. Left upper lobe lung mass is intensely FDG avid concerning for primary bronchogenic carcinoma. Tracer avid left hilar lymph node consistent with ipsilateral nodal metastasis. 2. Within the posterolateral right upper lobe there is an FDG avid spiculated nodule concerning for synchronous primary neoplasm. Less likely this could represent a focus of metastatic disease. No tracer avid right hilar or mediastinal lymph nodes. 3. No signs of metastatic disease to the abdomen, pelvis or osseous structures. 4. Incidental note made of marked asymmetric increased uptake localizing to the right epididymis and right testis. Differential considerations include epididymal-orchitis versus scrotal neoplasm. Clinical correlation is recommended. In the absence of signs or symptoms of inflammation/infection further investigation with scrotal sonogram is advised. 5. Aortic Atherosclerosis (ICD10-I70.0) and Emphysema (ICD10-J43.9). 6. Coronary artery calcifications.   CT Head 05/12/22: IMPRESSION: 1. No acute intracranial hemorrhage. 2. Old infarcts in the MCA territory on the left and cerebellum on the left. 3. Atrophy with chronic microvascular ischemic changes.  CTA neck 03/02/22: IMPRESSION: 1. Occlusion of the left ICA, from just distal to the bifurcation through the supraclinoid segment, where it reconstitutes, likely retrograde. This is new from the prior exam. 2. Diminished perfusion primarily in the left MCA territory on both visual inspection and CT perfusion study, where there is 121 mL of ischemic penumbra but no infarct core. 3. 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 4. No other intracranial large vessel  occlusion or significant stenosis. No other hemodynamically significant stenosis in the neck. S/p Emergent large vessel occlusion thrombolysis 03/02/22: IMPRESSION: - Status post endovascular complete revascularization of left internal carotid artery intracranially and extra cranially with 2 passes of a 5 mm x 37 mm Embotrap retrieval device and contact aspiration with complete revascularization. - Status post complete revascularization of the left middle cerebral artery M1 segment with 1 pass with a 3 mm x 40 mm Solitaire retrieval device, and contact aspiration achieving a TICI 2C revascularization. - Status post balloon angioplasty of underlying severe pre occlusive stenosis of the proximal left cavernous ICA due to intracranial arteriosclerosis with residual stenosis of approximately 50%.    EKG: 08/20/21: Sinus rhythm with occasional Premature ventricular complexes No significant change since last tracing Confirmed by Dorris Carnes [phone]) on 08/20/2021 8:21:52 PM   CV: Echo 03/13/22: - Sonographer Comments: Very poor parasternal window, scanned upright due to airway concerns.  IMPRESSIONS   1. Left ventricular ejection fraction, by estimation, is 40 to 45%. The  left ventricle has mildly decreased function. The left ventricle has no  regional wall motion abnormalities. Left ventricular diastolic parameters  are consistent with Grade I  diastolic dysfunction (impaired relaxation).   2. Right ventricular systolic function is normal. The right ventricular  size is normal.   3. The mitral valve was not well visualized. Trivial mitral valve  regurgitation.   4. The aortic valve was not well visualized. Aortic valve regurgitation  is not visualized. No aortic stenosis is present.   5. The inferior vena cava is normal in size with greater than 50%  respiratory  variability, suggesting right atrial pressure of 3 mmHg.  - Comparison 08/19/21: LVEF 40-45%, LV endocardial border not optimally  defined to evaluated regional wall motion, mild LVH, grade 1 DD, normal RVSP, mild AV sclerosis.   Past Medical History:  Diagnosis Date   Asthma    COPD (chronic obstructive pulmonary disease) (HCC)    GERD (gastroesophageal reflux disease)    History of tracheostomy    03/09/22-04/11/22   HLD (hyperlipidemia)    Hypertension    PAD (peripheral artery disease) ([place])    Stroke ([place]) 02/2022    Past Surgical History:  Procedure Laterality Date   ESOPHAGOGASTRODUODENOSCOPY (EGD) WITH PROPOFOL N/A 03/11/2022   Procedure: ESOPHAGOGASTRODUODENOSCOPY (EGD) WITH PROPOFOL;  Surgeon: Georganna Skeans, MD;  Location: [place];  Service: General;  Laterality: N/A;   IR ANGIO INTRA EXTRACRAN SEL COM CAROTID INNOMINATE UNI R MOD SED  03/02/2022   IR CT HEAD LTD  03/02/2022   IR PERCUTANEOUS ART THROMBECTOMY/INFUSION INTRACRANIAL INC DIAG ANGIO  03/02/2022   PEG PLACEMENT N/A 03/11/2022   Procedure: PERCUTANEOUS ENDOSCOPIC GASTROSTOMY (PEG) PLACEMENT;  Surgeon: Georganna Skeans, MD;  Location: [place];  Service: General;  Laterality: N/A;   RADIOLOGY WITH ANESTHESIA N/A 03/02/2022   Procedure: IR WITH ANESTHESIA;  Surgeon: Luanne Bras, MD;  Location: [place];  Service: Radiology;  Laterality: N/A;    MEDICATIONS: No current facility-administered medications for this encounter.    acetaminophen (TYLENOL) 325 MG tablet   budesonide (PULMICORT) 0.5 MG/2ML nebulizer solution   clopidogrel (PLAVIX) 75 MG tablet   folic acid (FOLVITE) 1 MG tablet   levETIRAcetam (KEPPRA) 500 MG tablet   Multiple Vitamin (MULTIVITAMIN WITH MINERALS) TABS tablet   nicotine (NICODERM CQ - DOSED IN MG/24 HOURS) 21 mg/24hr patch   pantoprazole (PROTONIX) 40 MG tablet   Tiotropium Bromide-Olodaterol (STIOLTO RESPIMAT) 2.5-2.5 MCG/ACT AERS   atorvastatin (LIPITOR) 40 MG tablet   cephALEXin (KEFLEX) 500 MG capsule   thiamine 100 MG tablet  Last dose of Plavix is documented as 05/23/22, although pulmonology  instructions were only for 3 days.    Myra Gianotti, PA-C Surgical Short Stay/Anesthesiology [facility] Phone [phone] [facility] Phone [phone] 05/27/2022 2:36 PM

## 2022-05-27 NOTE — Progress Notes (Signed)
Spoke with Livia Snellen (a relative of the patient) for information and instructions for DOS.  PCP - Dr Maudie Mercury Cardiologist - n/a  Chest x-ray - 05/06/22 (1V) CT Chest - 05/27/22 EKG - 08/20/21 Stress Test - n/a ECHO - 03/13/22 Cardiac Cath - n/a  ICD Pacemaker/Loop - n/a  Sleep Study -  n/a CPAP - none  Anesthesia review: Yes  STOP now taking any Aspirin (unless otherwise instructed by your surgeon), Aleve, Naproxen, Ibuprofen, Motrin, Advil, Goody's, BC's, all herbal medications, fish oil, and all vitamins.   Coronavirus Screening Does the patient have any of the following symptoms:  Cough yes/no: No Fever (>100.13F)  yes/no: No Runny nose yes/no: No Sore throat yes/no: No Difficulty breathing/shortness of breath  yes/no: No  Has the patient traveled in the last 14 days and where? yes/no: No  Livia Snellen, Relative of patient verbalized understanding of instructions that were given via phone.

## 2022-05-27 NOTE — Anesthesia Preprocedure Evaluation (Signed)
Anesthesia Evaluation  Patient identified by MRN, date of birth, ID band Patient awake    Reviewed: Allergy & Precautions, NPO status , Patient's Chart, lab work & pertinent test results  Airway Mallampati: II  TM Distance: >3 FB Neck ROM: Full    Dental  (+) Edentulous Upper, Edentulous Lower   Pulmonary asthma , COPD, Patient abstained from smoking., former smoker,    Pulmonary exam normal breath sounds clear to auscultation       Cardiovascular hypertension, Pt. on medications + Peripheral Vascular Disease  Normal cardiovascular exam Rhythm:Regular Rate:Normal  Echo 03/13/22: - Sonographer Comments: Very poor parasternal window, scanned upright due to airway concerns.  IMPRESSIONS  1. Left ventricular ejection fraction, by estimation, is 40 to 45%. The  left ventricle has mildly decreased function. The left ventricle has no  regional wall motion abnormalities. Left ventricular diastolic parameters  are consistent with Grade I  diastolic dysfunction (impaired relaxation).  2. Right ventricular systolic function is normal. The right ventricular  size is normal.  3. The mitral valve was not well visualized. Trivial mitral valve  regurgitation.  4. The aortic valve was not well visualized. Aortic valve regurgitation  is not visualized. No aortic stenosis is present.  5. The inferior vena cava is normal in size with greater than 50%  respiratory variability, suggesting right atrial pressure of 3 mmHg.    Neuro/Psych CVA, Residual Symptoms negative psych ROS   GI/Hepatic Neg liver ROS, GERD  ,  Endo/Other  negative endocrine ROS  Renal/GU negative Renal ROS  negative genitourinary   Musculoskeletal negative musculoskeletal ROS (+)   Abdominal   Peds negative pediatric ROS (+)  Hematology negative hematology ROS (+)   Anesthesia Other Findings   Reproductive/Obstetrics negative OB ROS                            Anesthesia Physical Anesthesia Plan  ASA: 3  Anesthesia Plan: General   Post-op Pain Management: Dilaudid IV and Minimal or no pain anticipated   Induction: Intravenous  PONV Risk Score and Plan: 2 and Ondansetron, Midazolam and Treatment may vary due to age or medical condition  Airway Management Planned: Oral ETT  Additional Equipment:   Intra-op Plan:   Post-operative Plan: Extubation in OR  Informed Consent: I have reviewed the patients History and Physical, chart, labs and discussed the procedure including the risks, benefits and alternatives for the proposed anesthesia with the patient or authorized representative who has indicated his/her understanding and acceptance.     Dental advisory given  Plan Discussed with: CRNA  Anesthesia Plan Comments: (See PAT note written 05/27/2022 by Myra Gianotti, PA-C. )       Anesthesia Quick Evaluation

## 2022-05-30 ENCOUNTER — Ambulatory Visit (HOSPITAL_COMMUNITY)
Admission: RE | Admit: 2022-05-30 | Discharge: 2022-05-30 | Disposition: A | Discharge status: Home / Self Care | Payer: Medicaid Other | Attending: Emergency Medicine | Admitting: Emergency Medicine

## 2022-05-30 ENCOUNTER — Encounter (HOSPITAL_COMMUNITY)
Admission: RE | Disposition: A | Discharge status: Home / Self Care | Payer: Self-pay | Source: Home / Self Care | Attending: Emergency Medicine

## 2022-05-30 ENCOUNTER — Other Ambulatory Visit: Payer: Self-pay

## 2022-05-30 ENCOUNTER — Ambulatory Visit (HOSPITAL_COMMUNITY): Payer: Medicaid Other | Admitting: Vascular Surgery

## 2022-05-30 ENCOUNTER — Ambulatory Visit (HOSPITAL_BASED_OUTPATIENT_CLINIC_OR_DEPARTMENT_OTHER): Payer: Medicaid Other | Admitting: Vascular Surgery

## 2022-05-30 ENCOUNTER — Encounter (HOSPITAL_COMMUNITY): Payer: Self-pay | Admitting: Emergency Medicine

## 2022-05-30 ENCOUNTER — Ambulatory Visit (HOSPITAL_COMMUNITY): Payer: Medicaid Other

## 2022-05-30 DIAGNOSIS — Z79899 Other long term (current) drug therapy: Secondary | ICD-10-CM | POA: Diagnosis not present

## 2022-05-30 DIAGNOSIS — Z7951 Long term (current) use of inhaled steroids: Secondary | ICD-10-CM | POA: Insufficient documentation

## 2022-05-30 DIAGNOSIS — R918 Other nonspecific abnormal finding of lung field: Secondary | ICD-10-CM

## 2022-05-30 DIAGNOSIS — J449 Chronic obstructive pulmonary disease, unspecified: Secondary | ICD-10-CM

## 2022-05-30 DIAGNOSIS — E785 Hyperlipidemia, unspecified: Secondary | ICD-10-CM | POA: Diagnosis not present

## 2022-05-30 DIAGNOSIS — I739 Peripheral vascular disease, unspecified: Secondary | ICD-10-CM | POA: Insufficient documentation

## 2022-05-30 DIAGNOSIS — C3490 Malignant neoplasm of unspecified part of unspecified bronchus or lung: Secondary | ICD-10-CM | POA: Diagnosis present

## 2022-05-30 DIAGNOSIS — I10 Essential (primary) hypertension: Secondary | ICD-10-CM | POA: Insufficient documentation

## 2022-05-30 DIAGNOSIS — Z87891 Personal history of nicotine dependence: Secondary | ICD-10-CM | POA: Diagnosis not present

## 2022-05-30 DIAGNOSIS — I69351 Hemiplegia and hemiparesis following cerebral infarction affecting right dominant side: Secondary | ICD-10-CM | POA: Insufficient documentation

## 2022-05-30 DIAGNOSIS — C3412 Malignant neoplasm of upper lobe, left bronchus or lung: Secondary | ICD-10-CM | POA: Insufficient documentation

## 2022-05-30 DIAGNOSIS — K219 Gastro-esophageal reflux disease without esophagitis: Secondary | ICD-10-CM | POA: Diagnosis not present

## 2022-05-30 DIAGNOSIS — J432 Centrilobular emphysema: Secondary | ICD-10-CM | POA: Insufficient documentation

## 2022-05-30 HISTORY — DX: Hyperlipidemia, unspecified: E78.5

## 2022-05-30 HISTORY — PX: BRONCHIAL NEEDLE ASPIRATION BIOPSY: SHX5106

## 2022-05-30 HISTORY — PX: BRONCHIAL BIOPSY: SHX5109

## 2022-05-30 HISTORY — PX: VIDEO BRONCHOSCOPY WITH RADIAL ENDOBRONCHIAL ULTRASOUND: SHX6849

## 2022-05-30 HISTORY — PX: BRONCHIAL BRUSHINGS: SHX5108

## 2022-05-30 HISTORY — DX: Peripheral vascular disease, unspecified: I73.9

## 2022-05-30 HISTORY — DX: Gastro-esophageal reflux disease without esophagitis: K21.9

## 2022-05-30 HISTORY — DX: Other specified postprocedural states: Z98.890

## 2022-05-30 HISTORY — PX: BRONCHIAL WASHINGS: SHX5105

## 2022-05-30 HISTORY — PX: FIDUCIAL MARKER PLACEMENT: SHX6858

## 2022-05-30 IMAGING — CR DG CHEST 1V PORT
1 series · 1 of 1 positions shown · non-contrast
Comparison: CT chest [DATE].

CLINICAL DATA: Status post bronchoscopy.

EXAM:
PORTABLE CHEST 1 VIEW

[AP]
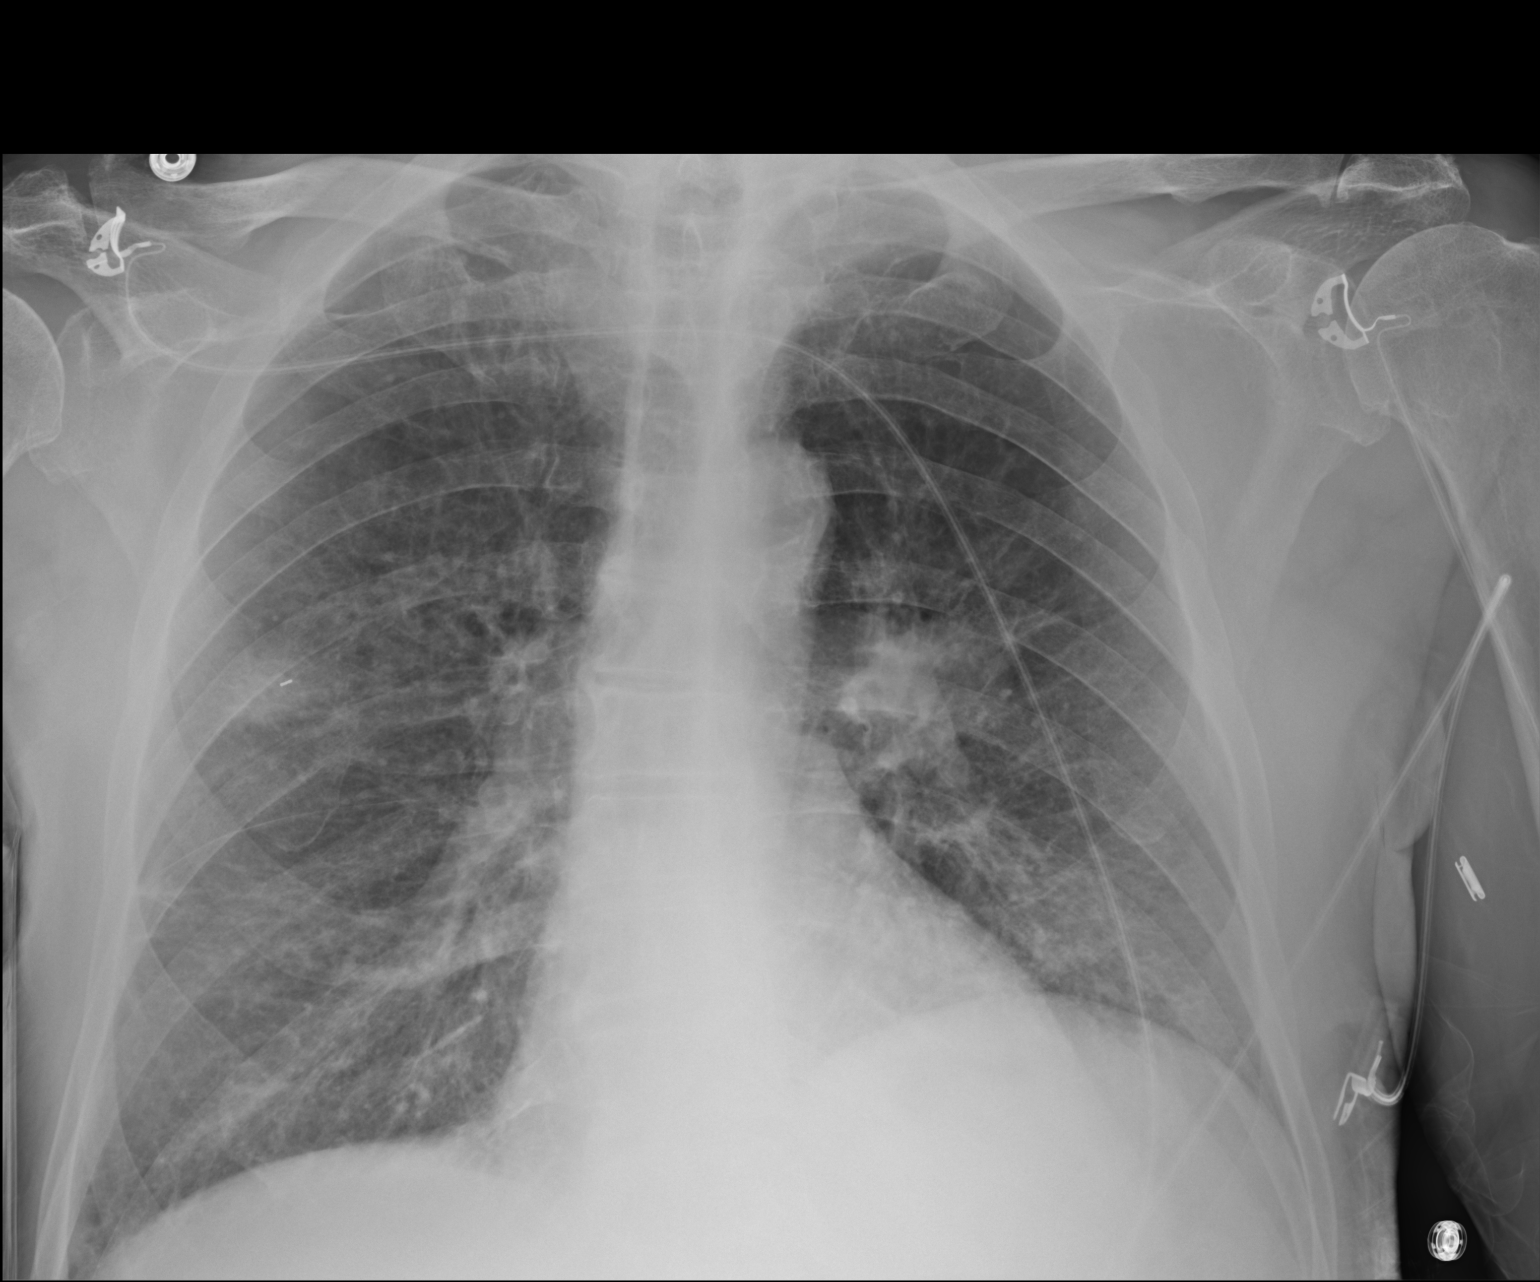

[1 of 1 positions shown; findings below may reference images not displayed]

FINDINGS: Heart size is normal. Atherosclerotic calcifications are present at
the arch. Metallic clip projects over the right middle lobe nodule.
The left suprahilar nodule is again noted. No pneumothorax. No
superimposed airspace disease.
IMPRESSION: 1. No acute cardiopulmonary disease or pneumothorax following
bronchoscopy.
2. Stable left suprahilar nodule.
3. Atherosclerosis.

## 2022-05-30 SURGERY — BRONCHOSCOPY, WITH BIOPSY USING ELECTROMAGNETIC NAVIGATION
Anesthesia: General

## 2022-05-30 MED ORDER — AMISULPRIDE (ANTIEMETIC) 5 MG/2ML IV SOLN: 10.0000 mg | Freq: Once | INTRAVENOUS | Status: DC | PRN

## 2022-05-30 MED ORDER — LACTATED RINGERS IV SOLN: INTRAVENOUS | Status: DC

## 2022-05-30 MED ORDER — PROPOFOL 10 MG/ML IV BOLUS
INTRAVENOUS | Status: DC | PRN
  Administered 2022-05-30: 150 mg via INTRAVENOUS

## 2022-05-30 MED ORDER — OXYCODONE HCL 5 MG/5ML PO SOLN: 5.0000 mg | Freq: Once | ORAL | Status: DC | PRN

## 2022-05-30 MED ORDER — PROMETHAZINE HCL 25 MG/ML IJ SOLN: 6.2500 mg | INTRAMUSCULAR | Status: DC | PRN

## 2022-05-30 MED ORDER — DEXAMETHASONE SODIUM PHOSPHATE 10 MG/ML IJ SOLN
INTRAMUSCULAR | Status: DC | PRN
  Administered 2022-05-30: 4 mg via INTRAVENOUS

## 2022-05-30 MED ORDER — LIDOCAINE 2% (20 MG/ML) 5 ML SYRINGE
INTRAMUSCULAR | Status: DC | PRN
  Administered 2022-05-30: 80 mg via INTRAVENOUS

## 2022-05-30 MED ORDER — ALBUMIN HUMAN 5 % IV SOLN
12.5000 g | Freq: Once | INTRAVENOUS | Status: AC
  Administered 2022-05-30: 12.5 g via INTRAVENOUS

## 2022-05-30 MED ORDER — OXYCODONE HCL 5 MG PO TABS: 5.0000 mg | ORAL_TABLET | Freq: Once | ORAL | Status: DC | PRN

## 2022-05-30 MED ORDER — HYDROMORPHONE HCL 1 MG/ML IJ SOLN: 0.2500 mg | INTRAMUSCULAR | Status: DC | PRN

## 2022-05-30 MED ORDER — PHENYLEPHRINE 80 MCG/ML (10ML) SYRINGE FOR IV PUSH (FOR BLOOD PRESSURE SUPPORT)
PREFILLED_SYRINGE | INTRAVENOUS | Status: DC | PRN
  Administered 2022-05-30 (×3): 160 ug via INTRAVENOUS
  Administered 2022-05-30: 240 ug via INTRAVENOUS
  Administered 2022-05-30: 80 ug via INTRAVENOUS

## 2022-05-30 MED ORDER — SUGAMMADEX SODIUM 200 MG/2ML IV SOLN
INTRAVENOUS | Status: DC | PRN
  Administered 2022-05-30: 200 mg via INTRAVENOUS

## 2022-05-30 MED ORDER — CLOPIDOGREL BISULFATE 75 MG PO TABS: 75.0000 mg | ORAL_TABLET | Freq: Every day | ORAL | 0 refills | Status: DC

## 2022-05-30 MED ORDER — ROCURONIUM BROMIDE 10 MG/ML (PF) SYRINGE
PREFILLED_SYRINGE | INTRAVENOUS | Status: DC | PRN
  Administered 2022-05-30: 30 mg via INTRAVENOUS

## 2022-05-30 MED ORDER — EPHEDRINE SULFATE-NACL 50-0.9 MG/10ML-% IV SOSY
PREFILLED_SYRINGE | INTRAVENOUS | Status: DC | PRN
  Administered 2022-05-30: 5 mg via INTRAVENOUS
  Administered 2022-05-30 (×3): 10 mg via INTRAVENOUS

## 2022-05-30 MED ORDER — SUCCINYLCHOLINE CHLORIDE 200 MG/10ML IV SOSY
PREFILLED_SYRINGE | INTRAVENOUS | Status: DC | PRN
  Administered 2022-05-30: 120 mg via INTRAVENOUS

## 2022-05-30 MED ORDER — FENTANYL CITRATE (PF) 250 MCG/5ML IJ SOLN
INTRAMUSCULAR | Status: DC | PRN
  Administered 2022-05-30: 100 ug via INTRAVENOUS

## 2022-05-30 MED ORDER — PHENYLEPHRINE HCL-NACL 20-0.9 MG/250ML-% IV SOLN
INTRAVENOUS | Status: DC | PRN
  Administered 2022-05-30: 75 ug/min via INTRAVENOUS

## 2022-05-30 MED ORDER — CHLORHEXIDINE GLUCONATE 0.12 % MT SOLN
15.0000 mL | Freq: Once | OROMUCOSAL | Status: AC
Start: 2022-05-30 — End: 2022-05-30
  Administered 2022-05-30: 15 mL via OROMUCOSAL

## 2022-05-30 MED ORDER — ALBUMIN HUMAN 5 % IV SOLN
INTRAVENOUS | Status: AC
  Filled 2022-05-30: qty 250

## 2022-05-30 MED ORDER — ONDANSETRON HCL 4 MG/2ML IJ SOLN
INTRAMUSCULAR | Status: DC | PRN
  Administered 2022-05-30: 4 mg via INTRAVENOUS

## 2022-05-30 SURGICAL SUPPLY — 1 items: SuperLock Fiducial Marker ×1 IMPLANT

## 2022-05-30 NOTE — Anesthesia Procedure Notes (Signed)
Procedure Name: Intubation Date/Time: 05/30/2022 7:43 AM  Performed by: Anastasio Auerbach, CRNAPre-anesthesia Checklist: Patient identified, Emergency Drugs available, Suction available and Patient being monitored Patient Re-evaluated:Patient Re-evaluated prior to induction Oxygen Delivery Method: Circle system utilized Preoxygenation: Pre-oxygenation with 100% oxygen Induction Type: IV induction Ventilation: Mask ventilation without difficulty and Oral airway inserted - appropriate to patient size Laryngoscope Size: Mac and 3 Grade View: Grade I Tube type: Oral Tube size: 8.5 mm Number of attempts: 1 Airway Equipment and Method: Stylet and Oral airway Placement Confirmation: ETT inserted through vocal cords under direct vision, positive ETCO2 and breath sounds checked- equal and bilateral Secured at: 22 cm Tube secured with: Tape Dental Injury: Teeth and Oropharynx as per pre-operative assessment

## 2022-05-30 NOTE — Interval H&P Note (Signed)
History and Physical Interval Note:  05/30/2022 7:20 AM  Todd Estrada  has presented today for surgery, with the diagnosis of bilateral pulm nodule.  The various methods of treatment have been discussed with the patient and family. After consideration of risks, benefits and other options for treatment, the patient has consented to  Procedure(s): ROBOTIC ASSISTED NAVIGATIONAL BRONCHOSCOPY (N/A) as a surgical intervention.  The patient's history has been reviewed, patient examined, no change in status, stable for surgery.  I have reviewed the patient's chart and labs.  Questions were answered to the patient's satisfaction.     Collene Gobble

## 2022-05-30 NOTE — Op Note (Signed)
Video Bronchoscopy with Robotic Assisted Bronchoscopic Navigation   Date of Operation: 05/30/2022   Pre-op Diagnosis: Bilateral pulmonary nodules  Post-op Diagnosis: Same  Surgeon: Baltazar Apo  Assistants: None  Anesthesia: General endotracheal anesthesia  Operation: Flexible video fiberoptic bronchoscopy with robotic assistance and biopsies.  Estimated Blood Loss: Minimal  Complications: None  Indications and History: Todd Estrada is a 61 y.o. male with history of tobacco use, hypertension, CVA.  He has been seen by Dr. Erin Fulling in our office for bilateral pulmonary nodules suspicious for primary lung malignancy.  Recommendation made to achieve a tissue diagnosis via robotic assisted navigational bronchoscopy. The risks, benefits, complications, treatment options and expected outcomes were discussed with the patient.  The possibilities of pneumothorax, pneumonia, reaction to medication, pulmonary aspiration, perforation of a viscus, bleeding, failure to diagnose a condition and creating a complication requiring transfusion or operation were discussed with the patient who freely signed the consent.    Description of Procedure: The patient was seen in the Preoperative Area, was examined and was deemed appropriate to proceed.  The patient was taken to [facility] endoscopy room 3, identified as Todd Estrada and the procedure verified as Flexible Video Fiberoptic Bronchoscopy.  A Time Out was held and the above information confirmed.   Prior to the date of the procedure a high-resolution CT scan of the chest was performed. Utilizing ION software program a virtual tracheobronchial tree was generated to allow the creation of distinct navigation pathways to the patient's parenchymal abnormalities. After being taken to the operating room general anesthesia was initiated and the patient  was orally intubated. The video fiberoptic bronchoscope was introduced via the endotracheal tube and a general inspection  was performed which showed normal right and left lung anatomy with the exception of a slightly nodular raised area of bronchial epithelium at the proximal medial right mainstem bronchus.  No clear endobronchial lesion. Aspiration of the bilateral mainstems was completed to remove any remaining secretions. Robotic catheter inserted into patient's endotracheal tube.   Target #1 left upper lobe pulmonary nodule: The distinct navigation pathways prepared prior to this procedure were then utilized to navigate to patient's lesion identified on CT scan.  Endobronchial lesion was visualized. The robotic catheter was secured into place and the vision probe was withdrawn.  Lesion location was approximated using fluoroscopy and radial endobronchial ultrasound for peripheral targeting. Under fluoroscopic guidance transbronchial needle brushings, transbronchial needle biopsies, and transbronchial forceps biopsies were performed to be sent for cytology and pathology.   Target #2 right upper lobe pulmonary nodule: The distinct navigation pathways prepared prior to this procedure were then utilized to navigate to patient's lesion identified on CT scan.  Endobronchial lesion was visualized.  The robotic catheter was secured into place and the vision probe was withdrawn.  Lesion location was approximated using fluoroscopy and radial endobronchial ultrasound for peripheral targeting. Under fluoroscopic guidance transbronchial needle brushings, transbronchial needle biopsies, and transbronchial forceps biopsies were performed to be sent for cytology and pathology.  A single fiducial marker was placed under fluoroscopic guidance adjacent to the nodule.  A bronchioalveolar lavage was performed in the right upper lobe and sent for cytology.  At the end of the procedure a general airway inspection was performed and there was no evidence of active bleeding. The bronchoscope was removed.  The patient tolerated the procedure well.  There was no significant blood loss and there were no obvious complications. A post-procedural chest x-ray is pending.  Samples Target #1: 1. Transbronchial needle  brushings from left upper lobe pulmonary nodule 2. Transbronchial Wang needle biopsies from left upper lobe pulmonary nodule 3. Transbronchial forceps biopsies from left upper lobe pulmonary nodule  Samples Target #2: 1. Transbronchial needle brushings from right upper lobe pulmonary nodule 2. Transbronchial Wang needle biopsies from right upper lobe pulmonary nodule 3. Transbronchial forceps biopsies from right upper lobe pulmonary nodule 4. Bronchoalveolar lavage from right upper lobe  Plans:  The patient will be discharged from the PACU to home when recovered from anesthesia and after chest x-ray is reviewed. We will review the cytology, pathology and microbiology results with the patient when they become available. Outpatient followup will be with Dr Erin Fulling.    Baltazar Apo, MD, PhD 05/30/2022, 8:53 AM [facility] [phone] or if no answer before 7:00PM call [phone] For any issues after 7:00PM please call eLink [phone]

## 2022-05-30 NOTE — Anesthesia Postprocedure Evaluation (Signed)
Anesthesia Post Note  Patient: RODNEY YERA  Procedure(s) Performed: ROBOTIC ASSISTED NAVIGATIONAL BRONCHOSCOPY BRONCHIAL BRUSHINGS BRONCHIAL NEEDLE ASPIRATION BIOPSIES BRONCHIAL BIOPSIES VIDEO BRONCHOSCOPY WITH RADIAL ENDOBRONCHIAL ULTRASOUND FIDUCIAL MARKER PLACEMENT BRONCHIAL WASHINGS     Patient location during evaluation: PACU Anesthesia Type: General Level of consciousness: awake and alert Pain management: pain level controlled Vital Signs Assessment: post-procedure vital signs reviewed and stable Respiratory status: spontaneous breathing, nonlabored ventilation and respiratory function stable Cardiovascular status: blood pressure returned to baseline and stable Postop Assessment: no apparent nausea or vomiting Anesthetic complications: no   No notable events documented.  Last Vitals:  Vitals:   05/30/22 0936 05/30/22 0951  BP: (!) 88/56 92/62  Pulse: 73 80  Resp: 17 (!) 21  Temp:    SpO2: 90% 90%    Last Pain:  Vitals:   05/30/22 0921  TempSrc:   PainSc: 0-No pain                 Lynda Rainwater

## 2022-05-30 NOTE — Transfer of Care (Signed)
Immediate Anesthesia Transfer of Care Note  Patient: Todd Estrada  Procedure(s) Performed: ROBOTIC ASSISTED NAVIGATIONAL BRONCHOSCOPY BRONCHIAL BRUSHINGS BRONCHIAL NEEDLE ASPIRATION BIOPSIES BRONCHIAL BIOPSIES VIDEO BRONCHOSCOPY WITH RADIAL ENDOBRONCHIAL ULTRASOUND FIDUCIAL MARKER PLACEMENT BRONCHIAL WASHINGS  Patient Location: PACU  Anesthesia Type:General  Level of Consciousness: awake, alert  and patient cooperative  Airway & Oxygen Therapy: Patient Spontanous Breathing and Patient connected to nasal cannula oxygen  Post-op Assessment: Report given to RN and Post -op Vital signs reviewed and stable  Post vital signs: Reviewed and stable  Last Vitals:  Vitals Value Taken Time  BP 97/68 05/30/22 0851  Temp    Pulse 91 05/30/22 0853  Resp 17 05/30/22 0853  SpO2 97 % 05/30/22 0853  Vitals shown include unvalidated device data.  Last Pain:  Vitals:   05/30/22 0624  TempSrc:   PainSc: 8       Patients Stated Pain Goal: 3 (72/25/75 0518)  Complications: No notable events documented.

## 2022-05-30 NOTE — Discharge Instructions (Signed)
Flexible Bronchoscopy, Care After This sheet gives you information about how to care for yourself after your test. Your doctor may also give you more specific instructions. If you have problems or questions, contact your doctor. Follow these instructions at home: Eating and drinking Do not eat or drink anything (not even water) for 2 hours after your test, or until your numbing medicine (local anesthetic) wears off. When your numbness is gone and your cough and gag reflexes have come back, you may: Eat only soft foods. Slowly drink liquids. The day after the test, go back to your normal diet. Driving Do not drive for 24 hours if you were given a medicine to help you relax (sedative). Do not drive or use heavy machinery while taking prescription pain medicine. General instructions  Take over-the-counter and prescription medicines only as told by your doctor. Return to your normal activities as told. Ask what activities are safe for you. Do not use any products that have nicotine or tobacco in them. This includes cigarettes and e-cigarettes. If you need help quitting, ask your doctor. Keep all follow-up visits as told by your doctor. This is important. It is very important if you had a tissue sample (biopsy) taken. Get help right away if: You have shortness of breath that gets worse. You get light-headed. You feel like you are going to pass out (faint). You have chest pain. You cough up: More than a little blood. More blood than before. Summary Do not eat or drink anything (not even water) for 2 hours after your test, or until your numbing medicine wears off. Do not use cigarettes. Do not use e-cigarettes. Get help right away if you have chest pain.  Please call our office for any questions or concerns.  [phone].  Okay to restart Plavix on 05/31/2022.  This information is not intended to replace advice given to you by your health care provider. Make sure you discuss any questions  you have with your health care provider. Document Released: 10/02/2009 Document Revised: 11/17/2017 Document Reviewed: 12/23/2016 Elsevier Patient Education  2020 [employer].

## 2022-05-31 ENCOUNTER — Other Ambulatory Visit: Payer: Self-pay | Admitting: Pulmonary Disease

## 2022-05-31 DIAGNOSIS — J432 Centrilobular emphysema: Secondary | ICD-10-CM

## 2022-06-01 ENCOUNTER — Encounter (HOSPITAL_COMMUNITY): Payer: Self-pay | Admitting: Emergency Medicine

## 2022-06-01 ENCOUNTER — Telehealth: Payer: Self-pay | Admitting: Emergency Medicine

## 2022-06-01 DIAGNOSIS — C3490 Malignant neoplasm of unspecified part of unspecified bronchus or lung: Secondary | ICD-10-CM

## 2022-06-01 LAB — CYTOLOGY - NON PAP

## 2022-06-01 NOTE — Telephone Encounter (Signed)
Called to speak with Todd Estrada regarding the patient's bronchoscopy results.  No answer, left VM and will try him back to discuss. Both the left upper lobe and right upper lobe nodules were consistent with squamous cell lung cancer.

## 2022-06-01 NOTE — Telephone Encounter (Signed)
Tried again, no answer

## 2022-06-01 NOTE — Telephone Encounter (Signed)
I discussed the biopsy results with [name].  He will pass this information along to [place].  He understands that both nodules are consistent with squamous cell lung cancer.  I will refer him to the [facility] to discuss care plan

## 2022-06-02 ENCOUNTER — Ambulatory Visit: Payer: Medicaid Other | Admitting: Neurology

## 2022-06-02 ENCOUNTER — Encounter: Payer: Self-pay | Admitting: *Deleted

## 2022-06-02 ENCOUNTER — Encounter: Payer: Self-pay | Admitting: Neurology

## 2022-06-02 VITALS — BP 122/78 | HR 87 | Ht 72.0 in

## 2022-06-02 DIAGNOSIS — R918 Other nonspecific abnormal finding of lung field: Secondary | ICD-10-CM

## 2022-06-02 DIAGNOSIS — R569 Unspecified convulsions: Secondary | ICD-10-CM

## 2022-06-02 DIAGNOSIS — Z87898 Personal history of other specified conditions: Secondary | ICD-10-CM

## 2022-06-02 DIAGNOSIS — I63512 Cerebral infarction due to unspecified occlusion or stenosis of left middle cerebral artery: Secondary | ICD-10-CM | POA: Diagnosis not present

## 2022-06-02 HISTORY — DX: Unspecified convulsions: R56.9

## 2022-06-02 MED ORDER — LEVETIRACETAM 500 MG PO TABS
500.0000 mg | ORAL_TABLET | Freq: Two times a day (BID) | ORAL | 3 refills | Status: DC
Start: 2022-06-02 — End: 2022-11-07

## 2022-06-02 MED ORDER — CLOPIDOGREL BISULFATE 75 MG PO TABS: 75.0000 mg | ORAL_TABLET | Freq: Every day | ORAL | 3 refills | Status: DC

## 2022-06-02 NOTE — Assessment & Plan Note (Signed)
Patient with pulmonary nodule, found to be positive for squamous cell cancer of lung. [facility], Dr. Lamonte Sakai, is aware and will discuss with patient's spokesperson, Laderius Valbuena, about findings and next steps.

## 2022-06-02 NOTE — Progress Notes (Signed)
I received referral on Todd Estrada today. I notified new patient coordinator to call and schedule him to be seen with Dr. Julien Nordmann on 7/6 with labs.

## 2022-06-02 NOTE — Progress Notes (Signed)
Chief Complaint  Patient presents with   Hospitalization Follow-up    Rm 20. Accompanied by [name]. Saw Dr. Leonie Man and can f/u w/ NP.      ASSESSMENT AND PLAN  Todd Estrada is a 61 y.o. 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   MEDICAL HISTORY:  Todd Estrada, is a 61 year old male, seen in request by PA   Vaughan Basta, [place] [address], PA-C Follow-up for stroke, he is accompanied by his cousin [name] at today's visit June 02, 2022  I reviewed and summarized the referring note.  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  He used to work as a Theme park manager, was not able to work over the past few years, lives with his cousin [name] for more than 30 years, heavy drinker and smoker  Suffered his first stroke August 18, 2021, presented with aphasia right-sided weakness, personally reviewed MRI of brain, large area of mixed acute/subacute ischemic infarction within the left MCA, petechiae hemorrhage at the infarction side,  CT angiogram of head and neck showed occlusion of left vertebral artery, severe stenosis of clinoid segment of left internal carotid artery due to low-density plaque, LDL 120 A1c 5.5, echo showed normal resources of cardiac emboli, he was discharged with Lipitor 80 mg, metoprolol and aspirin 81 mg monotherapy, with residual dysarthria, right upper extremity  weakness  Hospital admission again on March 02, 2022, with worsening expressive aphasia, right arm and leg weakness, also had a seizure, postevent confusion  He underwent IR thrombectomy by Dr. Estanislado Pandy, please complete revascularization of left terminal carotid artery intracranially, and balloon angioplasty,  Postprocedure MRI of brain showed acute left ACA infarction predominantly affecting the left frontal parietal and temporal lobe, with associated edema, sulcal height placement without midline shift, intra stroke petechiae hemorrhage  Hospital course was also complicated by aspiration pneumonia, recurrent intubation, multiple failed attempts of extubation, eventually leading to tracheostomy, severe dysphagia, required PEG tube placement March 24, evidence of severe peripheral vascular disease  He underwent extensive inpatient rehabilitation, now tracheostomy and PEG tube has been removed, nonhealing wound of bilateral lower extremity, eventually improved  He is now discharged home, severe aphasia, able to dress, feed himself, transfer, take a few steps with walker, Bronchoscopy biopsy of lung nodule confirmed squamous cell carcinoma, consult pending   PHYSICAL EXAM:   Vitals:   06/02/22 1357  BP: 122/78  Pulse: 87  Height: 6' (1.829 m)   Not recorded     Body mass index is 23.06 kg/m.  PHYSICAL EXAMNIATION:  Gen: NAD, conversant, well nourised, well groomed                     Cardiovascular: Regular rate rhythm, no peripheral edema, warm, nontender. Eyes: Conjunctivae clear without exudates or hemorrhage Neck: Supple, no carotid bruits. Pulmonary: Clear to auscultation bilaterally   NEUROLOGICAL EXAM:  MENTAL STATUS: Speech/cognition: Unkempt, sitting in wheelchair, global aphasia CRANIAL NERVES: CN  II:  Pupils are round equal and briskly reactive to light.  Right temporal visual field deficit CN III, IV, VI: Right exotropia CN V: Facial sensation is intact to  light touch CN VII: Right lower face weakness CN VIII: Hearing is normal to causal conversation. CN IX, X: Phonation is slurred CN XI: Head turning and shoulder shrug are intact  MOTOR: Antigravity movement of proximal right upper, lower extremity, 2 out of 5 strength at right distal arm and leg  REFLEXES: Hyperreflexia of right upper and lower extremity  SENSORY: Right hemisensory loss  COORDINATION: There is no trunk or limb dysmetria noted.  GAIT/STANCE: Deferred  REVIEW OF SYSTEMS:  Full 14 system review of systems performed and notable only for as above All other review of systems were negative.   ALLERGIES: No Known Allergies  HOME MEDICATIONS: Current Outpatient Medications  Medication Sig Dispense Refill   acetaminophen (TYLENOL) 325 MG tablet Take 1-2 tablets (325-650 mg total) by mouth every 4 (four) hours as needed for mild pain.     atorvastatin (LIPITOR) 40 MG tablet Take 1 tablet (40 mg total) by mouth daily. 90 tablet 3   clopidogrel (PLAVIX) 75 MG tablet Take 1 tablet (75 mg total) by mouth daily. Okay to restart this medication on 05/31/2022 30 tablet 0   folic acid (FOLVITE) 1 MG tablet Take 1 tablet (1 mg total) by mouth daily. 30 tablet 0   levETIRAcetam (KEPPRA) 500 MG tablet Take 1 tablet (500 mg total) by mouth 2 (two) times daily. 60 tablet 0   Multiple Vitamin (MULTIVITAMIN WITH MINERALS) TABS tablet Take 1 tablet by mouth daily. 30 tablet 0   nicotine (NICODERM CQ - DOSED IN MG/24 HOURS) 21 mg/24hr patch Place 21 mg onto the skin daily.     pantoprazole (PROTONIX) 40 MG tablet Take 1 tablet (40 mg total) by mouth daily. 30 tablet 0   thiamine 100 MG tablet Take 1 tablet (100 mg total) by mouth daily. 30 tablet 2   Tiotropium Bromide-Olodaterol (STIOLTO RESPIMAT) 2.5-2.5 MCG/ACT AERS Inhale 2 puffs into the lungs daily. (Patient taking differently: Inhale 1 puff into the lungs in the morning and at bedtime.) 4 g 0   budesonide (PULMICORT) 0.5 MG/2ML  nebulizer solution USE 2 ML(0.5 MG) VIA NEBULIZER TWICE DAILY 120 mL 0   No current facility-administered medications for this visit.    PAST MEDICAL HISTORY: Past Medical History:  Diagnosis Date   Asthma    COPD (chronic obstructive pulmonary disease) (HCC)    GERD (gastroesophageal reflux disease)    History of tracheostomy    03/09/22-04/11/22   HLD (hyperlipidemia)    Hypertension    PAD (peripheral artery disease) ([place])    Stroke ([place]) 02/2022    PAST SURGICAL HISTORY: Past Surgical History:  Procedure Laterality Date   BRONCHIAL BIOPSY  05/30/2022   Procedure: BRONCHIAL BIOPSIES;  Surgeon: Collene Gobble, MD;  Location: [facility] ENDOSCOPY;  Service: Pulmonary;;   BRONCHIAL BRUSHINGS  05/30/2022   Procedure: BRONCHIAL BRUSHINGS;  Surgeon: Collene Gobble, MD;  Location: [facility] ENDOSCOPY;  Service: Pulmonary;;   BRONCHIAL NEEDLE ASPIRATION BIOPSY  05/30/2022   Procedure: BRONCHIAL NEEDLE ASPIRATION BIOPSIES;  Surgeon: Collene Gobble, MD;  Location: [facility] ENDOSCOPY;  Service: Pulmonary;;   BRONCHIAL WASHINGS  05/30/2022   Procedure: BRONCHIAL WASHINGS;  Surgeon: Collene Gobble, MD;  Location: [facility] ENDOSCOPY;  Service: Pulmonary;;   ESOPHAGOGASTRODUODENOSCOPY (EGD) WITH PROPOFOL N/A 03/11/2022   Procedure: ESOPHAGOGASTRODUODENOSCOPY (EGD) WITH PROPOFOL;  Surgeon: Georganna Skeans,  MD;  Location: [place];  Service: General;  Laterality: N/A;   FIDUCIAL MARKER PLACEMENT  05/30/2022   Procedure: FIDUCIAL MARKER PLACEMENT;  Surgeon: Collene Gobble, MD;  Location: [facility] ENDOSCOPY;  Service: Pulmonary;;   IR ANGIO INTRA EXTRACRAN SEL COM CAROTID INNOMINATE UNI R MOD SED  03/02/2022   IR CT HEAD LTD  03/02/2022   IR PERCUTANEOUS ART THROMBECTOMY/INFUSION INTRACRANIAL INC DIAG ANGIO  03/02/2022   PEG PLACEMENT N/A 03/11/2022   Procedure: PERCUTANEOUS ENDOSCOPIC GASTROSTOMY (PEG) PLACEMENT;  Surgeon: Georganna Skeans, MD;  Location: [place];  Service: General;  Laterality: N/A;   RADIOLOGY WITH  ANESTHESIA N/A 03/02/2022   Procedure: IR WITH ANESTHESIA;  Surgeon: Luanne Bras, MD;  Location: [place];  Service: Radiology;  Laterality: N/A;   VIDEO BRONCHOSCOPY WITH RADIAL ENDOBRONCHIAL ULTRASOUND  05/30/2022   Procedure: VIDEO BRONCHOSCOPY WITH RADIAL ENDOBRONCHIAL ULTRASOUND;  Surgeon: Collene Gobble, MD;  Location: [facility] ENDOSCOPY;  Service: Pulmonary;;    FAMILY HISTORY: Family History  Problem Relation Age of Onset   Throat cancer Mother    Liver cancer Father    Kidney failure Sister    Cancer - Lung Paternal Uncle     SOCIAL HISTORY: Social History   Socioeconomic History   Marital status: Unknown    Spouse name: Not on file   Number of children: Not on file   Years of education: Not on file   Highest education level: Not on file  Occupational History   Not on file  Tobacco Use   Smoking status: Former    Types: Cigarettes    Quit date: 05/02/2022    Years since quitting: 0.0    Passive exposure: Never   Smokeless tobacco: Never  Vaping Use   Vaping Use: Never used  Substance and Sexual Activity   Alcohol use: Not Currently   Drug use: No   Sexual activity: Not on file  Other Topics Concern   Not on file  Social History Narrative   Not on file   Social Determinants of Health   Financial Resource Strain: Not on file  Food Insecurity: Not on file  Transportation Needs: Not on file  Physical Activity: Not on file  Stress: Not on file  Social Connections: Not on file  Intimate Partner Violence: Not on file      Marcial Pacas, M.D. Ph.D.  [facility] [address] [place], [place] [address] Ph: [phone] Fax: [phone]  CC:  Barbie Banner, PA-C [place],  [place] [address]  Pcp, No

## 2022-06-06 ENCOUNTER — Encounter: Payer: Medicaid Other | Admitting: Student

## 2022-06-07 ENCOUNTER — Encounter: Payer: Self-pay | Admitting: *Deleted

## 2022-06-07 NOTE — Progress Notes (Signed)
I followed up with new patient coordinator to see if she has been able to reach Todd Estrada.  She has not been able to reach him.  She states she will try again this afternoon.

## 2022-06-08 ENCOUNTER — Encounter: Payer: Self-pay | Admitting: *Deleted

## 2022-06-08 DIAGNOSIS — C3490 Malignant neoplasm of unspecified part of unspecified bronchus or lung: Secondary | ICD-10-CM

## 2022-06-08 NOTE — Progress Notes (Signed)
I spoke to Todd Estrada today. I updated him on his appt to see Dr. Julien Nordmann on 7/7 with labs. He verbalized understanding of appt

## 2022-06-09 ENCOUNTER — Other Ambulatory Visit: Payer: Self-pay | Admitting: Internal Medicine

## 2022-06-09 ENCOUNTER — Other Ambulatory Visit: Payer: Self-pay | Admitting: *Deleted

## 2022-06-09 ENCOUNTER — Encounter: Payer: Self-pay | Admitting: *Deleted

## 2022-06-09 NOTE — Progress Notes (Signed)
The proposed treatment discussed in cancer conference is for discussion purpose only and is not a binding recommendation. The patient was not physically examined nor present for their treatment options. Therefore, fianl treatment plan cannot be decided.

## 2022-06-09 NOTE — Progress Notes (Signed)
Per Dr. Julien Nordmann, I notified pathology to send PDL 1 testing on Todd Estrada's recent bx.

## 2022-06-10 ENCOUNTER — Telehealth: Payer: Self-pay | Admitting: *Deleted

## 2022-06-15 ENCOUNTER — Telehealth: Payer: Self-pay | Admitting: Radiation Oncology

## 2022-06-15 NOTE — Telephone Encounter (Signed)
LVM to change appt time for consult with Dr. Lisbeth Renshaw

## 2022-06-16 ENCOUNTER — Ambulatory Visit: Payer: Medicaid Other | Admitting: Radiation Oncology

## 2022-06-16 ENCOUNTER — Encounter: Payer: Self-pay | Admitting: Radiation Oncology

## 2022-06-16 ENCOUNTER — Ambulatory Visit
Admission: RE | Admit: 2022-06-16 | Discharge: 2022-06-16 | Disposition: A | Discharge status: Home / Self Care | Payer: Medicaid Other | Source: Ambulatory Visit | Attending: Radiation Oncology | Admitting: Radiation Oncology

## 2022-06-16 ENCOUNTER — Ambulatory Visit: Payer: Medicaid Other

## 2022-06-16 ENCOUNTER — Other Ambulatory Visit: Payer: Self-pay

## 2022-06-16 ENCOUNTER — Inpatient Hospital Stay
Admission: RE | Admit: 2022-06-16 | Discharge: 2022-06-16 | Disposition: A | Discharge status: Home / Self Care | Payer: Medicaid Other | Source: Ambulatory Visit | Attending: Radiation Oncology | Admitting: Radiation Oncology

## 2022-06-16 ENCOUNTER — Other Ambulatory Visit: Payer: Self-pay | Admitting: Pulmonary Disease

## 2022-06-16 VITALS — BP 119/75 | HR 92 | Temp 98.0°F | Resp 18 | Ht 72.0 in | Wt 176.2 lb

## 2022-06-16 DIAGNOSIS — K219 Gastro-esophageal reflux disease without esophagitis: Secondary | ICD-10-CM | POA: Diagnosis not present

## 2022-06-16 DIAGNOSIS — Z801 Family history of malignant neoplasm of trachea, bronchus and lung: Secondary | ICD-10-CM | POA: Diagnosis not present

## 2022-06-16 DIAGNOSIS — R590 Localized enlarged lymph nodes: Secondary | ICD-10-CM | POA: Diagnosis not present

## 2022-06-16 DIAGNOSIS — Z7951 Long term (current) use of inhaled steroids: Secondary | ICD-10-CM | POA: Insufficient documentation

## 2022-06-16 DIAGNOSIS — I700 Atherosclerosis of aorta: Secondary | ICD-10-CM | POA: Diagnosis not present

## 2022-06-16 DIAGNOSIS — Z7982 Long term (current) use of aspirin: Secondary | ICD-10-CM | POA: Insufficient documentation

## 2022-06-16 DIAGNOSIS — E785 Hyperlipidemia, unspecified: Secondary | ICD-10-CM | POA: Diagnosis not present

## 2022-06-16 DIAGNOSIS — Z87891 Personal history of nicotine dependence: Secondary | ICD-10-CM | POA: Insufficient documentation

## 2022-06-16 DIAGNOSIS — C3411 Malignant neoplasm of upper lobe, right bronchus or lung: Secondary | ICD-10-CM | POA: Insufficient documentation

## 2022-06-16 DIAGNOSIS — J432 Centrilobular emphysema: Secondary | ICD-10-CM | POA: Diagnosis not present

## 2022-06-16 DIAGNOSIS — I10 Essential (primary) hypertension: Secondary | ICD-10-CM | POA: Insufficient documentation

## 2022-06-16 DIAGNOSIS — J449 Chronic obstructive pulmonary disease, unspecified: Secondary | ICD-10-CM | POA: Insufficient documentation

## 2022-06-16 DIAGNOSIS — I2510 Atherosclerotic heart disease of native coronary artery without angina pectoris: Secondary | ICD-10-CM | POA: Diagnosis not present

## 2022-06-16 DIAGNOSIS — C3490 Malignant neoplasm of unspecified part of unspecified bronchus or lung: Secondary | ICD-10-CM

## 2022-06-16 DIAGNOSIS — C3412 Malignant neoplasm of upper lobe, left bronchus or lung: Secondary | ICD-10-CM

## 2022-06-16 DIAGNOSIS — Z79899 Other long term (current) drug therapy: Secondary | ICD-10-CM | POA: Diagnosis not present

## 2022-06-16 DIAGNOSIS — Z7902 Long term (current) use of antithrombotics/antiplatelets: Secondary | ICD-10-CM | POA: Diagnosis not present

## 2022-06-16 DIAGNOSIS — Z800 Family history of malignant neoplasm of digestive organs: Secondary | ICD-10-CM | POA: Insufficient documentation

## 2022-06-16 DIAGNOSIS — Z8673 Personal history of transient ischemic attack (TIA), and cerebral infarction without residual deficits: Secondary | ICD-10-CM | POA: Insufficient documentation

## 2022-06-16 DIAGNOSIS — G40909 Epilepsy, unspecified, not intractable, without status epilepticus: Secondary | ICD-10-CM | POA: Insufficient documentation

## 2022-06-16 DIAGNOSIS — I739 Peripheral vascular disease, unspecified: Secondary | ICD-10-CM | POA: Diagnosis not present

## 2022-06-16 NOTE — Progress Notes (Signed)
Thoracic Location of Tumor / Histology: Bilateral Upper Lobe Lungs   Patient presented to pulmonary clinic for emphysema.   CT Super D Chest 05/27/2022: Left upper lobe mass and right upper lobe pulmonary nodules are similar to prior examinations and both maintain an aggressive appearance, suggestive of synchronous bilateral lung neoplasms.  Associated left hilar lymphadenopathy previously demonstrated to be hypermetabolic is suspicious for nodal metastasis. Findings in the periphery of the left upper lobe could suggest worsening postobstructive changes or evolving peripheral lymphangitic spread of tumor.   CT Head 05/12/2022: No acute intracranial hemorrhage. 2. Old infarcts in the MCA territory on the left and cerebellum on the left.   PET 05/12/2022: Perihilar left upper lobe lung mass measures 3.6 x 2.7 cm.  Adjacent left hilar lymph node measuring 1.7 cm.  Mild subpleural postobstructive changes identified within the periphery of the anterior left upper lobe.  Within the posterior lateral right upper lobe there is a spiculated nodule measuring 2.0 x 1.7 cm   CT Chest 03/18/2022: Moderate to severe emphysema.  There is 2.7 x 3.7 cm lobulated spiculated mass within the left upper lobe.  There is a 16 x 20 mm spiculated nodule within the posterior segment of the right upper lobe.     Biopsies of RUL / LUL Lung 05/30/2022        Tobacco/Marijuana/Snuff/ETOH use: Former Smoker- quit 02/2022   Past/Anticipated interventions by cardiothoracic surgery, if any:    Past/Anticipated interventions by medical oncology, if any:  Dr. Julien Nordmann 06/24/2022     Signs/Symptoms Weight changes, if any: Weight is stable. Respiratory complaints, if any: No Hemoptysis, if any: Reports productive cough with clear phlegm.  Denies hemoptysis. Pain issues, if any:  No Family notes some difficulty with swallowing, mainly due to eating to fast.   SAFETY ISSUES: Prior radiation? No Pacemaker/ICD? No  Possible  current pregnancy? N/a Is the patient on methotrexate? No   Current Complaints / other details:  -Right side deficits from previous stroke and speech.

## 2022-06-16 NOTE — Progress Notes (Signed)
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 Diagnoses of Squamous cell carcinoma of bronchus in left upper lobe (HCC) and Squamous cell carcinoma of lung, unspecified laterality ([place]) were also pertinent to this visit.   HISTORY OF PRESENT ILLNESS: Todd Estrada is a 61 y.o. male seen at the request of Dr. Lamonte Sakai for a diagnosis of synchronous lung cancer. He was diagnosed with a Stroke in March 2023 and during his hospitalization, a CT chest on 03/18/22 showed bilateral spiculated mass in the upper lobes, in the LUL measuring 3.7 cm, and RUL measuring 2 cm, and a 13 mm indeterminate nodule in the LLL and mucoid impaction in the RLL. A left hilar node measured 14 mm. A PET scan on 05/12/22 showed a 3.6 cm LUL  mass with an SUV of 9.72, and RUL measuring 2 cm mass with SUV of 10.76, and the left hilar node had an SUV of 8.3.   He underwent bronchoscopy on 05/30/22 that showed squamous cell carcinoma in the specimens from both the RUL and LUL lesions. He's seen to discuss treatment of his cancer.    PREVIOUS RADIATION THERAPY: No   PAST MEDICAL HISTORY:  Past Medical History:  Diagnosis Date   Asthma    COPD (chronic obstructive pulmonary disease) (HCC)    GERD (gastroesophageal reflux disease)    History of tracheostomy    03/09/22-04/11/22   HLD (hyperlipidemia)    Hypertension    PAD (peripheral artery disease) (HCC)    Seizures ([place]) 06/02/2022   Stroke ([place]) 02/2022       PAST SURGICAL HISTORY: Past Surgical History:  Procedure Laterality Date   BRONCHIAL BIOPSY  05/30/2022   Procedure: BRONCHIAL BIOPSIES;  Surgeon: Collene Gobble, MD;  Location: [facility] ENDOSCOPY;  Service:  Pulmonary;;   BRONCHIAL BRUSHINGS  05/30/2022   Procedure: BRONCHIAL BRUSHINGS;  Surgeon: Collene Gobble, MD;  Location: [facility] ENDOSCOPY;  Service: Pulmonary;;   BRONCHIAL NEEDLE ASPIRATION BIOPSY  05/30/2022   Procedure: BRONCHIAL NEEDLE ASPIRATION BIOPSIES;  Surgeon: Collene Gobble, MD;  Location: [facility] ENDOSCOPY;  Service: Pulmonary;;   BRONCHIAL WASHINGS  05/30/2022   Procedure: BRONCHIAL WASHINGS;  Surgeon: Collene Gobble, MD;  Location: [facility] ENDOSCOPY;  Service: Pulmonary;;   ESOPHAGOGASTRODUODENOSCOPY (EGD) WITH PROPOFOL N/A 03/11/2022   Procedure: ESOPHAGOGASTRODUODENOSCOPY (EGD) WITH PROPOFOL;  Surgeon: Georganna Skeans, MD;  Location: [place];  Service: General;  Laterality: N/A;   FIDUCIAL MARKER PLACEMENT  05/30/2022   Procedure: FIDUCIAL MARKER PLACEMENT;  Surgeon: Collene Gobble, MD;  Location: [facility] ENDOSCOPY;  Service: Pulmonary;;   IR ANGIO INTRA EXTRACRAN SEL COM CAROTID INNOMINATE UNI R MOD SED  03/02/2022   IR CT HEAD LTD  03/02/2022   IR PERCUTANEOUS ART THROMBECTOMY/INFUSION INTRACRANIAL INC DIAG ANGIO  03/02/2022   PEG PLACEMENT N/A 03/11/2022   Procedure: PERCUTANEOUS ENDOSCOPIC GASTROSTOMY (PEG) PLACEMENT;  Surgeon: Georganna Skeans, MD;  Location: [place];  Service: General;  Laterality: N/A;   RADIOLOGY WITH ANESTHESIA N/A 03/02/2022   Procedure: IR WITH ANESTHESIA;  Surgeon: Luanne Bras, MD;  Location: [place];  Service: Radiology;  Laterality: N/A;   VIDEO BRONCHOSCOPY WITH RADIAL ENDOBRONCHIAL ULTRASOUND  05/30/2022   Procedure: VIDEO BRONCHOSCOPY WITH RADIAL ENDOBRONCHIAL ULTRASOUND;  Surgeon: Collene Gobble, MD;  Location: [facility] ENDOSCOPY;  Service: Pulmonary;;     FAMILY HISTORY:  Family History  Problem Relation Age of Onset   Throat cancer Mother    Liver cancer Father    Kidney failure Sister    Cancer - Lung Paternal Uncle      SOCIAL HISTORY:  reports that he quit smoking about 6 weeks ago. His smoking use included cigarettes. He has never been exposed  to tobacco smoke. He has never used smokeless tobacco. He reports that he does not currently use alcohol. He reports that he does not use drugs. The patient is accompanied by his cousin who is his surrogate decision maker and signs consent forms. He is single and lives in [place].    ALLERGIES: Patient has no known allergies.   MEDICATIONS:  Current Outpatient Medications  Medication Sig Dispense Refill   acetaminophen (TYLENOL) 325 MG tablet Take 1-2 tablets (325-650 mg total) by mouth every 4 (four) hours as needed for mild pain.     ASPIRIN LOW DOSE 81 MG tablet Take 81 mg by mouth daily.     atorvastatin (LIPITOR) 40 MG tablet Take 1 tablet (40 mg total) by mouth daily. 90 tablet 3   budesonide (PULMICORT) 0.5 MG/2ML nebulizer solution USE 2 ML(0.5 MG) VIA NEBULIZER TWICE DAILY 120 mL 0   clopidogrel (PLAVIX) 75 MG tablet Take 1 tablet (75 mg total) by mouth daily. Okay to restart this medication on 05/31/2022 90 tablet 3   folic acid (FOLVITE) 1 MG tablet Take 1 tablet (1 mg total) by mouth daily. 30 tablet 0   levETIRAcetam (KEPPRA) 500 MG tablet Take 1 tablet (500 mg total) by mouth 2 (two) times daily. 180 tablet 3   Multiple Vitamin (MULTIVITAMIN WITH MINERALS) TABS tablet Take 1 tablet by mouth daily. 30 tablet 0   nicotine (NICODERM CQ - DOSED IN MG/24 HOURS) 21 mg/24hr patch Place 21 mg onto the skin daily.     pantoprazole (PROTONIX) 40 MG tablet Take 1 tablet (40 mg total) by mouth daily. 30 tablet 0   thiamine 100 MG tablet Take 1 tablet (100 mg total) by mouth daily. 30 tablet 2   Tiotropium Bromide-Olodaterol (STIOLTO RESPIMAT) 2.5-2.5 MCG/ACT AERS Inhale 2 puffs into the lungs daily. (Patient taking differently: Inhale 1 puff into the lungs in the morning and at bedtime.) 4 g 0   No current facility-administered medications for this encounter.     REVIEW OF SYSTEMS: On review of systems, the patient's speech is limited from his prior stroke, and is difficult to  understand, but his cousin states he is doing pretty well. However, he has a chronic cough that can be productive with clear phlegm but no hemoptysis, and his weight has been stable. He does have difficulty swallowing due to eating quickly. He struggles with right sided arm weakness  from prior stroke. No other complaints are verbalized.      PHYSICAL EXAM:  Wt Readings from Last 3 Encounters:  06/16/22 176 lb 4 oz (79.9 kg)  05/30/22 170 lb (77.1 kg)  05/12/22 170 lb (77.1 kg)   Temp Readings from Last 3 Encounters:  06/16/22 98 F (36.7 C) (Oral)  05/30/22 99 F (37.2 C)  05/13/22 99.5 F (37.5 C) (Oral)   BP Readings from Last 3 Encounters:  06/16/22 119/75  06/02/22 122/78  05/30/22 92/62   Pulse Readings from Last 3 Encounters:  06/16/22 92  06/02/22 87  05/30/22 80   Pain Assessment Pain Score: 0-No pain/10  In general this is a chronically ill appearing caucasian male in no acute distress. He's alert and oriented x4 and appropriate throughout the examination. Cardiopulmonary assessment is negative for acute distress and he exhibits normal effort.     ECOG = 3  0 - Asymptomatic (Fully active, able to carry on all predisease activities without restriction)  1 - Symptomatic but completely ambulatory (Restricted in physically strenuous activity but ambulatory and able to carry out work of a light or sedentary nature. For example, light housework, office work)  2 - Symptomatic, <50% in bed during the day (Ambulatory and capable of all self care but unable to carry out any work activities. Up and about more than 50% of waking hours)  3 - Symptomatic, >50% in bed, but not bedbound (Capable of only limited self-care, confined to bed or chair 50% or more of waking hours)  4 - Bedbound (Completely disabled. Cannot carry on any self-care. Totally confined to bed or chair)  5 - Death   Eustace Pen MM, Creech RH, Tormey DC, et al. [id number]). "Toxicity and response criteria of the  [facility]". [place] Oncol. 5 (6): 649-55    LABORATORY DATA:  Lab Results  Component Value Date   WBC 16.0 (H) 05/06/2022   HGB 14.0 05/06/2022   HCT 43.4 05/06/2022   MCV 92.9 05/06/2022   PLT 265 05/06/2022   Lab Results  Component Value Date   NA 137 05/06/2022   K 3.6 05/06/2022   CL 104 05/06/2022   CO2 25 05/06/2022   Lab Results  Component Value Date   ALT 13 05/06/2022   AST 19 05/06/2022   ALKPHOS 77 05/06/2022   BILITOT 0.8 05/06/2022      RADIOGRAPHY: DG Chest Port 1 View  Result Date: 05/30/2022 CLINICAL DATA:  Status post bronchoscopy. EXAM: PORTABLE CHEST 1 VIEW COMPARISON:  CT chest 05/27/2022. FINDINGS: Heart size is normal. Atherosclerotic calcifications are present at the arch. Metallic clip projects over the right middle lobe nodule. The left suprahilar nodule is again noted. No pneumothorax. No superimposed airspace disease. IMPRESSION: 1. No acute cardiopulmonary disease or pneumothorax following bronchoscopy. 2. Stable left suprahilar nodule. 3. Atherosclerosis. Electronically Signed   By: San Morelle M.D.   On: 05/30/2022 10:31   DG C-ARM BRONCHOSCOPY  Result Date: 05/30/2022 C-ARM BRONCHOSCOPY: Fluoroscopy was utilized by the requesting physician.  No radiographic interpretation.   CT Super D Chest Wo Contrast  Result Date: 05/30/2022 CLINICAL DATA:  61 year old male with history of lung mass. Follow-up study. EXAM: CT CHEST WITHOUT CONTRAST TECHNIQUE: Multidetector CT imaging of the chest was performed using thin slice collimation for electromagnetic bronchoscopy planning purposes, without intravenous contrast. RADIATION DOSE REDUCTION: This exam was performed according to the departmental dose-optimization program which includes automated exposure control, adjustment of the mA and/or kV according to patient size and/or use of iterative reconstruction technique. COMPARISON:  Multiple priors, most recently PET-CT  05/12/2022. FINDINGS: Cardiovascular: Heart size is normal. There is no significant pericardial fluid, thickening or pericardial calcification. There is aortic atherosclerosis, as well as atherosclerosis of the great vessels of the mediastinum and the coronary arteries, including calcified atherosclerotic plaque in the left main, left anterior descending, left circumflex and right coronary arteries. Mild calcifications of the aortic valve. Mediastinum/Nodes: Enlarged left hilar lymph node (axial image 75 of series 3) measuring up to 2 cm in short axis.  No definite pathologically enlarged mediastinal or right hilar lymphadenopathy confidently identified on today's noncontrast CT examination. Esophagus is unremarkable in appearance. No axillary lymphadenopathy. Lungs/Pleura: In the central aspect of the left upper lobe (axial image 71 of series 3 and coronal image 51 of series 6) there is a macrolobulated slightly spiculated lesion measuring 3.7 x 3.3 x 3.2 cm. Increasingly conspicuous area of architectural distortion in the periphery of the left upper lobe distal to this mass, which may reflect evolving postobstructive changes, or potentially some peripheral lymphangitic spread of tumor, as this is rather nodular in appearance, particularly medially where the largest nodular component currently measures 1.1 x 1.0 cm (axial image 57 of series 4). An additional aggressive appearing nodule in the posterior aspect of the right upper lobe (axial image 77 of series 4 and coronal image 89 of series 6) is also noted with macrolobulated and slightly spiculated margins measuring 2.0 x 1.5 x 1.3 cm. 7 x 3 mm (mean diameter of 5 mm) right lower lobe pulmonary nodule (axial image 123 of series 4), unchanged. A few scattered calcified granulomas are noted in the lungs. No acute consolidative airspace disease. No pleural effusions. Diffuse bronchial wall thickening with moderate centrilobular and paraseptal emphysema. Upper  Abdomen: Aortic atherosclerosis. Musculoskeletal: There are no aggressive appearing lytic or blastic lesions noted in the visualized portions of the skeleton. IMPRESSION: 1. Left upper lobe mass and right upper lobe pulmonary nodules are similar to prior examinations and both maintain an aggressive appearance, suggestive of synchronous bilateral lung neoplasms. Associated left hilar lymphadenopathy previously demonstrated to be hypermetabolic is suspicious for nodal metastasis. Findings in the periphery of the left upper lobe could suggest worsening postobstructive changes or evolving peripheral lymphangitic spread of tumor. 2. Diffuse bronchial wall thickening with moderate centrilobular and paraseptal emphysema; imaging findings suggestive of underlying COPD. 3. Aortic atherosclerosis, in addition to left main and three-vessel coronary artery disease. Please note that although the presence of coronary artery calcium documents the presence of coronary artery disease, the severity of this disease and any potential stenosis cannot be assessed on this non-gated CT examination. Assessment for potential risk factor modification, dietary therapy or pharmacologic therapy may be warranted, if clinically indicated. Aortic Atherosclerosis (ICD10-I70.0) and Emphysema (ICD10-J43.9). Electronically Signed   By: Vinnie Langton M.D.   On: 05/30/2022 06:40       IMPRESSION/PLAN: 1. Synchronous Stage IB, cT2aN0M0, NSCLC favor squamous cell carcinoma of the LUL and Stage IA2, cT1bN0M0, NSCLC, favor squamous cell carcinoma of the RUL. Dr. Lisbeth Renshaw discusses the pathology findings and reviews the nature of early stage lung cancer. Dr. Lisbeth Renshaw recommends proceeding with definitive stereotactic body radiotherapy (SBRT) to the RUL and LUL lesions.  We discussed the risks, benefits, short, and long term effects of radiotherapy, as well as the curative intent, and the patient and his cousin are interested in proceeding. Dr. Lisbeth Renshaw  discusses the delivery and logistics of radiotherapy and anticipates a course of 3-5 fractions of  SBRT . Written consent is obtained and placed in the chart, a copy was provided to the patient. The patient will be contacted to coordinate treatment planning by our simulation department.  2. Left hilar adenopathy. It's felt that this will need to be followed but likely reactive in nature when discussed in thoracic oncology conference.   In a visit lasting 60 minutes, greater than 50% of the time was spent face to face discussing the patient's condition, in preparation for the discussion, and coordinating the patient's care.  The above documentation reflects my direct findings during this shared patient visit. Please see the separate note by Dr. Lisbeth Renshaw on this date for the remainder of the patient's plan of care.    Carola Rhine, [facility] [place]   **Disclaimer: This note was dictated with voice recognition software. Similar sounding words can inadvertently be transcribed and this note may contain transcription errors which may not have been corrected upon publication of note.**

## 2022-06-17 ENCOUNTER — Telehealth: Payer: Self-pay

## 2022-06-17 NOTE — Telephone Encounter (Signed)
Dierdre from [facility] called to get extended verbal orders for speech therapy 2 week 3. Verbal orders given

## 2022-06-22 ENCOUNTER — Other Ambulatory Visit: Payer: Self-pay

## 2022-06-22 ENCOUNTER — Encounter: Payer: Self-pay | Admitting: Student

## 2022-06-22 ENCOUNTER — Ambulatory Visit: Payer: Medicaid Other | Admitting: Student

## 2022-06-22 VITALS — BP 103/71 | HR 84 | Temp 98.1°F | Ht 72.0 in | Wt 178.8 lb

## 2022-06-22 DIAGNOSIS — C3490 Malignant neoplasm of unspecified part of unspecified bronchus or lung: Secondary | ICD-10-CM

## 2022-06-22 DIAGNOSIS — Z8673 Personal history of transient ischemic attack (TIA), and cerebral infarction without residual deficits: Secondary | ICD-10-CM

## 2022-06-22 DIAGNOSIS — Z0000 Encounter for general adult medical examination without abnormal findings: Secondary | ICD-10-CM | POA: Insufficient documentation

## 2022-06-22 MED ORDER — CLOPIDOGREL BISULFATE 75 MG PO TABS: 75.0000 mg | ORAL_TABLET | Freq: Every day | ORAL | 3 refills | Status: DC

## 2022-06-22 NOTE — Patient Instructions (Signed)
Todd Estrada,  It was a pleasure seeing you in the clinic today.  Here is a summary what we talked about:  1.  For your stroke, please continue to work with physical therapy to get stronger.  2.  Please only take Plavix now. STOP aspirin.  3.  Please follow-up with your cancer doctor on 06/24/2022.  I will also place a referral to palliative team to talk about goals of care.  Please return by the end of this month,  Take care  Dr. Alfonse Spruce

## 2022-06-22 NOTE — Assessment & Plan Note (Addendum)
This is a new diagnosis.  He has multiple lung nodule in the left upper lobe, right upper and lower lobe nodules.  He underwent bronchoscopy and biopsy which confirmed diagnosis of squamous cell carcinoma.  He will follow-up with Dr. Earlie Server on 06/24/2022.  We discussed his goal of care regarding the new diagnosis of lung cancer.  Patient states that he is not depressed but also not worried about this diagnosis.  Caretaker stated that his mood has been unchanged before and after the recent hospitalization and new diagnosis of cancer.  I introduced to him the palliative branch of medicine, which focuses more on symptomatic management rather than treating the disease.  Patient and caretaker would like to talk to the palliative team.  Patient states that he is willing to continue treatment as long as it helps.  If the treatment is no longer effective, he will proceed with comfort care.  -Ambulatory referral to palliative care -We will follow-up with patient in 2-3 weeks after his oncology visits.

## 2022-06-22 NOTE — Progress Notes (Addendum)
CC: Follow-up on CVA and new diagnosis of lung cancer  HPI:  [name] is a 61 y.o. with past medical history of COPD, hypertension, CVA who presents to the clinic today to follow-up on his new diagnosis of lung cancer.  Please see problem based charting for detail  Past Medical History:  Diagnosis Date   Asthma    COPD (chronic obstructive pulmonary disease) (HCC)    GERD (gastroesophageal reflux disease)    History of tracheostomy    03/09/22-04/11/22   HLD (hyperlipidemia)    Hypertension    PAD (peripheral artery disease) (HCC)    Seizures ([place]) 06/02/2022   Stroke ([place]) 02/2022   Review of Systems:  per HPI  Physical Exam:  Vitals:   06/22/22 1536  BP: 103/71  Pulse: 84  Temp: 98.1 F (36.7 C)  TempSrc: Oral  SpO2: 94%  Weight: 178 lb 12.8 oz (81.1 kg)  Height: 6' (1.829 m)   Physical Exam Constitutional:      General: He is not in acute distress.    Comments: Appears older than stated age.  Appears disheveled  HENT:     Head: Normocephalic.  Eyes:     General:        Right eye: No discharge.        Left eye: No discharge.  Cardiovascular:     Rate and Rhythm: Normal rate and regular rhythm.  Pulmonary:     Effort: Pulmonary effort is normal. No respiratory distress.     Breath sounds: Normal breath sounds.  Skin:    General: Skin is warm.  Neurological:     Mental Status: He is alert.     Comments: 3/5 strength of right upper extremity.  Normal range of motion of left upper extremity.  Psychiatric:     Comments: Flat affect      Assessment & Plan:   Hx of ischemic left MCA stroke Left MCA stroke in March 2023 status post IR thrombectomy.  His neurodeficits including right upper extremity, left right lower extremity and dysarthria.  He has a caretaker which is his cousin.  He has no other family support.  Caretaker states that he did not want to work with physical therapy due to preference.  He has worked with speech therapy and has no  issue with swallowing.  He has trouble expressing himself due to the dysarthria.  He was seen by neurologist recently and advised to continue Plavix daily.  He has finished at least 30-month course of DAPT.  -Continue Plavix daily -Stop low-dose aspirin -Continue atorvastatin -Continue PT/SLP at home  Squamous cell lung cancer [facility]) This is a new diagnosis.  He has multiple lung nodule in the left upper lobe, right upper and lower lobe nodules.  He underwent bronchoscopy and biopsy which confirmed diagnosis of squamous cell carcinoma.  He will follow-up with Dr. Earlie Server on 06/24/2022.  We discussed his goal of care regarding the new diagnosis of lung cancer.  Patient states that he is not depressed but also not worried about this diagnosis.  Caretaker stated that his mood has been unchanged before and after the recent hospitalization and new diagnosis of cancer.  I introduced to him the palliative branch of medicine, which focuses more on symptomatic management rather than treating the disease.  Patient and caretaker would like to talk to the palliative team.  Patient states that he is willing to continue treatment as long as it helps.  If the treatment is no longer  effective, he will proceed with comfort care.  -Ambulatory referral to palliative care -We will follow-up with patient in 2-3 weeks after his oncology visits.  Healthcare maintenance Patient is due for colonoscopy but I do not think that he will benefit from this screening given his life expectancy.  We will rediscuss this at follow-up visit.   See Encounters Tab for problem based charting.  Patient discussed with Dr.  Cain Sieve

## 2022-06-22 NOTE — Assessment & Plan Note (Signed)
Left MCA stroke in March 2023 status post IR thrombectomy.  His neurodeficits including right upper extremity, left right lower extremity and dysarthria.  He has a caretaker which is his cousin.  He has no other family support.  Caretaker states that he did not want to work with physical therapy due to preference.  He has worked with speech therapy and has no issue with swallowing.  He has trouble expressing himself due to the dysarthria.  He was seen by neurologist recently and advised to continue Plavix daily.  He has finished at least 59-month course of DAPT.  -Continue Plavix daily -Stop low-dose aspirin -Continue atorvastatin -Continue PT/SLP at home

## 2022-06-22 NOTE — Assessment & Plan Note (Signed)
Patient is due for colonoscopy but I do not think that he will benefit from this screening given his life expectancy.  We will rediscuss this at follow-up visit.

## 2022-06-23 ENCOUNTER — Telehealth: Payer: Self-pay

## 2022-06-23 NOTE — Progress Notes (Signed)
Internal Medicine Clinic Attending  Case discussed with Dr. Nguyen  At the time of the visit.  We reviewed the resident's history and exam and pertinent patient test results.  I agree with the assessment, diagnosis, and plan of care documented in the resident's note. 

## 2022-06-23 NOTE — Telephone Encounter (Signed)
Attempted to contact patient to schedule a Palliative Care consult appointment. No answer left a message to return call.  

## 2022-06-24 ENCOUNTER — Other Ambulatory Visit: Payer: Self-pay

## 2022-06-24 ENCOUNTER — Inpatient Hospital Stay: Payer: Medicaid Other

## 2022-06-24 ENCOUNTER — Inpatient Hospital Stay (HOSPITAL_BASED_OUTPATIENT_CLINIC_OR_DEPARTMENT_OTHER): Payer: Medicaid Other | Admitting: Internal Medicine

## 2022-06-24 ENCOUNTER — Ambulatory Visit
Admission: RE | Admit: 2022-06-24 | Discharge: 2022-06-24 | Disposition: A | Discharge status: Home / Self Care | Payer: Medicaid Other | Source: Ambulatory Visit | Attending: Radiation Oncology | Admitting: Radiation Oncology

## 2022-06-24 VITALS — BP 124/78 | HR 82 | Temp 97.9°F | Resp 19 | Ht 72.0 in | Wt 176.9 lb

## 2022-06-24 DIAGNOSIS — C3412 Malignant neoplasm of upper lobe, left bronchus or lung: Secondary | ICD-10-CM | POA: Insufficient documentation

## 2022-06-24 DIAGNOSIS — Z87891 Personal history of nicotine dependence: Secondary | ICD-10-CM | POA: Insufficient documentation

## 2022-06-24 DIAGNOSIS — J449 Chronic obstructive pulmonary disease, unspecified: Secondary | ICD-10-CM | POA: Diagnosis not present

## 2022-06-24 DIAGNOSIS — Z808 Family history of malignant neoplasm of other organs or systems: Secondary | ICD-10-CM

## 2022-06-24 DIAGNOSIS — I739 Peripheral vascular disease, unspecified: Secondary | ICD-10-CM | POA: Diagnosis not present

## 2022-06-24 DIAGNOSIS — Z5111 Encounter for antineoplastic chemotherapy: Secondary | ICD-10-CM | POA: Insufficient documentation

## 2022-06-24 DIAGNOSIS — R918 Other nonspecific abnormal finding of lung field: Secondary | ICD-10-CM

## 2022-06-24 DIAGNOSIS — C3411 Malignant neoplasm of upper lobe, right bronchus or lung: Secondary | ICD-10-CM

## 2022-06-24 DIAGNOSIS — Z801 Family history of malignant neoplasm of trachea, bronchus and lung: Secondary | ICD-10-CM | POA: Insufficient documentation

## 2022-06-24 DIAGNOSIS — Z800 Family history of malignant neoplasm of digestive organs: Secondary | ICD-10-CM

## 2022-06-24 DIAGNOSIS — K219 Gastro-esophageal reflux disease without esophagitis: Secondary | ICD-10-CM

## 2022-06-24 DIAGNOSIS — C3492 Malignant neoplasm of unspecified part of left bronchus or lung: Secondary | ICD-10-CM

## 2022-06-24 DIAGNOSIS — Z510 Encounter for antineoplastic radiation therapy: Secondary | ICD-10-CM | POA: Insufficient documentation

## 2022-06-24 DIAGNOSIS — Z79899 Other long term (current) drug therapy: Secondary | ICD-10-CM | POA: Insufficient documentation

## 2022-06-24 DIAGNOSIS — I10 Essential (primary) hypertension: Secondary | ICD-10-CM

## 2022-06-24 DIAGNOSIS — Z930 Tracheostomy status: Secondary | ICD-10-CM

## 2022-06-24 DIAGNOSIS — Z8673 Personal history of transient ischemic attack (TIA), and cerebral infarction without residual deficits: Secondary | ICD-10-CM

## 2022-06-24 DIAGNOSIS — R059 Cough, unspecified: Secondary | ICD-10-CM

## 2022-06-24 LAB — CMP (CANCER CENTER ONLY)
ALT: 11 U/L (ref 0–44)
AST: 15 U/L (ref 15–41)
Albumin: 3.9 g/dL (ref 3.5–5.0)
Alkaline Phosphatase: 92 U/L (ref 38–126)
Anion gap: 6 (ref 5–15)
BUN: 11 mg/dL (ref 8–23)
CO2: 32 mmol/L (ref 22–32)
Calcium: 9.4 mg/dL (ref 8.9–10.3)
Chloride: 103 mmol/L (ref 98–111)
Creatinine: 0.72 mg/dL (ref 0.61–1.24)
GFR, Estimated: >60 mL/min (ref >60)
Glucose, Bld: 116 mg/dL — ABNORMAL HIGH (ref 70–99)
Potassium: 3.6 mmol/L (ref 3.5–5.1)
Sodium: 141 mmol/L (ref 135–145)
Total Bilirubin: 0.7 mg/dL (ref 0.3–1.2)
Total Protein: 7.2 g/dL (ref 6.5–8.1)

## 2022-06-24 LAB — CBC WITH DIFFERENTIAL (CANCER CENTER ONLY)
Abs Immature Granulocytes: 0.03 10*3/uL (ref 0.00–0.07)
Basophils Absolute: 0 10*3/uL (ref 0.0–0.1)
Basophils Relative: 0 %
Eosinophils Absolute: 0.2 10*3/uL (ref 0.0–0.5)
Eosinophils Relative: 2 %
HCT: 42 % (ref 39.0–52.0)
Hemoglobin: 13.8 g/dL (ref 13.0–17.0)
Immature Granulocytes: 0 %
Lymphocytes Relative: 16 %
Lymphs Abs: 1.7 10*3/uL (ref 0.7–4.0)
MCH: 30.1 pg (ref 26.0–34.0)
MCHC: 32.9 g/dL (ref 30.0–36.0)
MCV: 91.5 fL (ref 80.0–100.0)
Monocytes Absolute: 0.5 10*3/uL (ref 0.1–1.0)
Monocytes Relative: 5 %
Neutro Abs: 7.7 10*3/uL (ref 1.7–7.7)
Neutrophils Relative %: 77 %
Platelet Count: 184 10*3/uL (ref 150–400)
RBC: 4.59 MIL/uL (ref 4.22–5.81)
RDW: 13.2 % (ref 11.5–15.5)
WBC Count: 10.1 10*3/uL (ref 4.0–10.5)
nRBC: 0 % (ref 0.0–0.2)

## 2022-06-24 NOTE — Progress Notes (Signed)
START ON PATHWAY REGIMEN - Non-Small Cell Lung     A cycle is every 7 days, concurrent with RT:     Paclitaxel      Carboplatin   **Always confirm dose/schedule in your pharmacy ordering system**  Patient Characteristics: Preoperative or Nonsurgical Candidate (Clinical Staging), Stage II, Nonsurgical Candidate Therapeutic Status: Preoperative or Nonsurgical Candidate (Clinical Staging) AJCC T Category: cT2a AJCC N Category: cN1 AJCC M Category: cM0 AJCC 8 Stage Grouping: IIB Intent of Therapy: Curative Intent, Discussed with Patient

## 2022-06-24 NOTE — Progress Notes (Signed)
[place] [phone]   [phone]  CONSULT NOTE  REFERRING PHYSICIAN: Dr. Baltazar Apo.  REASON FOR CONSULTATION:  61 years old white male recently diagnosed with lung cancer.  HPI Todd Estrada is a 61 y.o. male with past medical history significant for asthma, COPD, GERD, dyslipidemia, hypertension, peripheral arterial disease, seizure, stroke and history of tracheostomy in March 2023.  The patient has history of pulmonary nodules that were seen on previous imaging studies several months ago.  He had CT scan of the chest on March 18, 2022 that showed bilateral upper lobe lobulated/spiculated masses suspicious for synchronous bronchogenic neoplasm.  There was 1.3 cm indeterminant round nodule within the left lower lobe.  On May 12, 2022 he had a PET scan performed and it showed perihilar left upper lobe lung mass measuring 3.6 x 2.7 cm with SUV max of 9.72.  There was adjacent left hilar lymph node that was also FDG avid measuring 1.7 cm with SUV max of 8.34.  Within the posterior lateral right upper lobe there was a spiculated nodule measuring 2.0 x 1.7 cm with SUV max of 10.76 there was no signs of metastatic disease to the abdomen, pelvis or osseous structures.  CT scan of the head without contrast on May 12, 2022 showed no acute intracranial hemorrhage and there was old infarcts in the MCA territory on the left and cerebellum on the left.  On May 27, 2022 the patient had CT super D of the chest performed and showed left upper lobe mass with the right upper lobe pulmonary nodules similar to the prior exam and both maintain an aggressive appearance suggestive of synchronous bilateral lung neoplasm with associated left hilar lymphadenopathy previously demonstrated to be hypermetabolic and suspicious for nodal metastasis.  On May 30, 2022 the patient underwent video bronchoscopy with robotic assisted bronchoscopic navigation under the care of Dr. Lamonte Sakai. The  final pathology ([id number], [id number]) from the right upper lobe and left upper lobe brushing and fine-needle aspiration showed malignant cells consistent with squamous cell carcinoma.  The biopsy was tested for PD-L1 expression and it was 20%. The patient was referred to me today for evaluation and recommendation regarding treatment of his condition. When seen today the patient is feeling fine with no concerning complaints except for the mild cough.  He denied having any chest pain, shortness of breath, or hemoptysis.  He has no recent weight loss or night sweats.  He has no nausea, vomiting, diarrhea or constipation.  He has no headache or visual changes. Family history significant for father with lung cancer and sister had colon cancer.  His mother died from old age. The patient is single and has no children.  He was accompanied today by his cousin Todd Estrada.  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  He has a history of smoking 1 pack/day for around 55 years and quit in June 2023.  He also has a history of alcohol abuse but not recently.  He has no history of drug abuse.  HPI  Past Medical History:  Diagnosis Date   Asthma    COPD (chronic obstructive pulmonary disease) (HCC)    GERD (gastroesophageal reflux disease)    History of tracheostomy    03/09/22-04/11/22   HLD (hyperlipidemia)    Hypertension    PAD (peripheral artery disease) (HCC)    Seizures ([place]) 06/02/2022   Stroke ([place]) 02/2022    Past Surgical History:  Procedure  Laterality Date   BRONCHIAL BIOPSY  05/30/2022   Procedure: BRONCHIAL BIOPSIES;  Surgeon: Collene Gobble, MD;  Location: [facility] ENDOSCOPY;  Service: Pulmonary;;   BRONCHIAL BRUSHINGS  05/30/2022   Procedure: BRONCHIAL BRUSHINGS;  Surgeon: Collene Gobble, MD;  Location: [facility] ENDOSCOPY;  Service: Pulmonary;;   BRONCHIAL NEEDLE ASPIRATION BIOPSY  05/30/2022   Procedure: BRONCHIAL NEEDLE ASPIRATION BIOPSIES;  Surgeon: Collene Gobble, MD;   Location: [place];  Service: Pulmonary;;   BRONCHIAL WASHINGS  05/30/2022   Procedure: BRONCHIAL WASHINGS;  Surgeon: Collene Gobble, MD;  Location: [facility] ENDOSCOPY;  Service: Pulmonary;;   ESOPHAGOGASTRODUODENOSCOPY (EGD) WITH PROPOFOL N/A 03/11/2022   Procedure: ESOPHAGOGASTRODUODENOSCOPY (EGD) WITH PROPOFOL;  Surgeon: Georganna Skeans, MD;  Location: [place];  Service: General;  Laterality: N/A;   FIDUCIAL MARKER PLACEMENT  05/30/2022   Procedure: FIDUCIAL MARKER PLACEMENT;  Surgeon: Collene Gobble, MD;  Location: [facility] ENDOSCOPY;  Service: Pulmonary;;   IR ANGIO INTRA EXTRACRAN SEL COM CAROTID INNOMINATE UNI R MOD SED  03/02/2022   IR CT HEAD LTD  03/02/2022   IR PERCUTANEOUS ART THROMBECTOMY/INFUSION INTRACRANIAL INC DIAG ANGIO  03/02/2022   PEG PLACEMENT N/A 03/11/2022   Procedure: PERCUTANEOUS ENDOSCOPIC GASTROSTOMY (PEG) PLACEMENT;  Surgeon: Georganna Skeans, MD;  Location: [place];  Service: General;  Laterality: N/A;   RADIOLOGY WITH ANESTHESIA N/A 03/02/2022   Procedure: IR WITH ANESTHESIA;  Surgeon: Luanne Bras, MD;  Location: [place];  Service: Radiology;  Laterality: N/A;   VIDEO BRONCHOSCOPY WITH RADIAL ENDOBRONCHIAL ULTRASOUND  05/30/2022   Procedure: VIDEO BRONCHOSCOPY WITH RADIAL ENDOBRONCHIAL ULTRASOUND;  Surgeon: Collene Gobble, MD;  Location: [facility] ENDOSCOPY;  Service: Pulmonary;;    Family History  Problem Relation Age of Onset   Throat cancer Mother    Liver cancer Father    Kidney failure Sister    Cancer - Lung Paternal Uncle     Social History Social History   Tobacco Use   Smoking status: Former    Types: Cigarettes    Quit date: 05/02/2022    Years since quitting: 0.1    Passive exposure: Never   Smokeless tobacco: Never  Vaping Use   Vaping Use: Never used  Substance Use Topics   Alcohol use: Not Currently   Drug use: No    No Known Allergies  Current Outpatient Medications  Medication Sig Dispense Refill   acetaminophen (TYLENOL) 325 MG  tablet Take 1-2 tablets (325-650 mg total) by mouth every 4 (four) hours as needed for mild pain.     atorvastatin (LIPITOR) 40 MG tablet Take 1 tablet (40 mg total) by mouth daily. 90 tablet 3   budesonide (PULMICORT) 0.5 MG/2ML nebulizer solution USE 2 ML(0.5 MG) VIA NEBULIZER TWICE DAILY 120 mL 0   clopidogrel (PLAVIX) 75 MG tablet Take 1 tablet (75 mg total) by mouth daily. Okay to restart this medication on 05/31/2022 90 tablet 3   folic acid (FOLVITE) 1 MG tablet Take 1 tablet (1 mg total) by mouth daily. 30 tablet 0   levETIRAcetam (KEPPRA) 500 MG tablet Take 1 tablet (500 mg total) by mouth 2 (two) times daily. 180 tablet 3   Multiple Vitamin (MULTIVITAMIN WITH MINERALS) TABS tablet Take 1 tablet by mouth daily. 30 tablet 0   nicotine (NICODERM CQ - DOSED IN MG/24 HOURS) 21 mg/24hr patch Place 21 mg onto the skin daily.     pantoprazole (PROTONIX) 40 MG tablet Take 1 tablet (40 mg total) by mouth daily. 30 tablet 0   STIOLTO  RESPIMAT 2.5-2.5 MCG/ACT AERS INHALE 2 PUFFS INTO THE LUNGS DAILY 4 g 5   thiamine 100 MG tablet Take 1 tablet (100 mg total) by mouth daily. 30 tablet 2   No current facility-administered medications for this visit.    Review of Systems  Constitutional: positive for fatigue Eyes: negative Ears, nose, mouth, throat, and face: negative Respiratory: positive for cough Cardiovascular: negative Gastrointestinal: negative Genitourinary:negative Integument/breast: negative Hematologic/lymphatic: negative Musculoskeletal:positive for muscle weakness Neurological: positive for speech problems and weakness Behavioral/Psych: negative Endocrine: negative Allergic/Immunologic: negative  Physical Exam  VXY:IAXKP, healthy, no distress, well nourished, and well developed SKIN: skin color, texture, turgor are normal, no rashes or significant lesions HEAD: Normocephalic, No masses, lesions, tenderness or abnormalities EYES: normal, PERRLA, Conjunctiva are pink and  non-injected EARS: External ears normal, Canals clear OROPHARYNX:no exudate, no erythema, and lips, buccal mucosa, and tongue normal  NECK: supple, no adenopathy, no JVD LYMPH:  no palpable lymphadenopathy, no hepatosplenomegaly LUNGS: clear to auscultation , and palpation HEART: regular rate & rhythm, no murmurs, and no gallops ABDOMEN:abdomen soft, non-tender, normal bowel sounds, and no masses or organomegaly BACK: Back symmetric, no curvature., No CVA tenderness EXTREMITIES:no joint deformities, effusion, or inflammation, no edema  NEURO: alert & oriented x 3 with fluent speech, no focal motor/sensory deficits  PERFORMANCE STATUS: ECOG 1  LABORATORY DATA: Lab Results  Component Value Date   WBC 10.1 06/24/2022   HGB 13.8 06/24/2022   HCT 42.0 06/24/2022   MCV 91.5 06/24/2022   PLT 184 06/24/2022      Chemistry      Component Value Date/Time   NA 141 06/24/2022 0805   K 3.6 06/24/2022 0805   CL 103 06/24/2022 0805   CO2 32 06/24/2022 0805   BUN 11 06/24/2022 0805   CREATININE 0.72 06/24/2022 0805      Component Value Date/Time   CALCIUM 9.4 06/24/2022 0805   ALKPHOS 92 06/24/2022 0805   AST 15 06/24/2022 0805   ALT 11 06/24/2022 0805   BILITOT 0.7 06/24/2022 0805       RADIOGRAPHIC STUDIES: DG Chest Port 1 View  Result Date: 05/30/2022 CLINICAL DATA:  Status post bronchoscopy. EXAM: PORTABLE CHEST 1 VIEW COMPARISON:  CT chest 05/27/2022. FINDINGS: Heart size is normal. Atherosclerotic calcifications are present at the arch. Metallic clip projects over the right middle lobe nodule. The left suprahilar nodule is again noted. No pneumothorax. No superimposed airspace disease. IMPRESSION: 1. No acute cardiopulmonary disease or pneumothorax following bronchoscopy. 2. Stable left suprahilar nodule. 3. Atherosclerosis. Electronically Signed   By: San Morelle M.D.   On: 05/30/2022 10:31   DG C-ARM BRONCHOSCOPY  Result Date: 05/30/2022 C-ARM BRONCHOSCOPY:  Fluoroscopy was utilized by the requesting physician.  No radiographic interpretation.   CT Super D Chest Wo Contrast  Result Date: 05/30/2022 CLINICAL DATA:  61 year old male with history of lung mass. Follow-up study. EXAM: CT CHEST WITHOUT CONTRAST TECHNIQUE: Multidetector CT imaging of the chest was performed using thin slice collimation for electromagnetic bronchoscopy planning purposes, without intravenous contrast. RADIATION DOSE REDUCTION: This exam was performed according to the departmental dose-optimization program which includes automated exposure control, adjustment of the mA and/or kV according to patient size and/or use of iterative reconstruction technique. COMPARISON:  Multiple priors, most recently PET-CT 05/12/2022. FINDINGS: Cardiovascular: Heart size is normal. There is no significant pericardial fluid, thickening or pericardial calcification. There is aortic atherosclerosis, as well as atherosclerosis of the great vessels of the mediastinum and the coronary arteries, including calcified atherosclerotic  plaque in the left main, left anterior descending, left circumflex and right coronary arteries. Mild calcifications of the aortic valve. Mediastinum/Nodes: Enlarged left hilar lymph node (axial image 75 of series 3) measuring up to 2 cm in short axis. No definite pathologically enlarged mediastinal or right hilar lymphadenopathy confidently identified on today's noncontrast CT examination. Esophagus is unremarkable in appearance. No axillary lymphadenopathy. Lungs/Pleura: In the central aspect of the left upper lobe (axial image 71 of series 3 and coronal image 51 of series 6) there is a macrolobulated slightly spiculated lesion measuring 3.7 x 3.3 x 3.2 cm. Increasingly conspicuous area of architectural distortion in the periphery of the left upper lobe distal to this mass, which may reflect evolving postobstructive changes, or potentially some peripheral lymphangitic spread of tumor, as  this is rather nodular in appearance, particularly medially where the largest nodular component currently measures 1.1 x 1.0 cm (axial image 57 of series 4). An additional aggressive appearing nodule in the posterior aspect of the right upper lobe (axial image 77 of series 4 and coronal image 89 of series 6) is also noted with macrolobulated and slightly spiculated margins measuring 2.0 x 1.5 x 1.3 cm. 7 x 3 mm (mean diameter of 5 mm) right lower lobe pulmonary nodule (axial image 123 of series 4), unchanged. A few scattered calcified granulomas are noted in the lungs. No acute consolidative airspace disease. No pleural effusions. Diffuse bronchial wall thickening with moderate centrilobular and paraseptal emphysema. Upper Abdomen: Aortic atherosclerosis. Musculoskeletal: There are no aggressive appearing lytic or blastic lesions noted in the visualized portions of the skeleton. IMPRESSION: 1. Left upper lobe mass and right upper lobe pulmonary nodules are similar to prior examinations and both maintain an aggressive appearance, suggestive of synchronous bilateral lung neoplasms. Associated left hilar lymphadenopathy previously demonstrated to be hypermetabolic is suspicious for nodal metastasis. Findings in the periphery of the left upper lobe could suggest worsening postobstructive changes or evolving peripheral lymphangitic spread of tumor. 2. Diffuse bronchial wall thickening with moderate centrilobular and paraseptal emphysema; imaging findings suggestive of underlying COPD. 3. Aortic atherosclerosis, in addition to left main and three-vessel coronary artery disease. Please note that although the presence of coronary artery calcium documents the presence of coronary artery disease, the severity of this disease and any potential stenosis cannot be assessed on this non-gated CT examination. Assessment for potential risk factor modification, dietary therapy or pharmacologic therapy may be warranted, if clinically  indicated. Aortic Atherosclerosis (ICD10-I70.0) and Emphysema (ICD10-J43.9). Electronically Signed   By: Vinnie Langton M.D.   On: 05/30/2022 06:40    ASSESSMENT: This is a very pleasant 61 years old white male with history of stroke and speech problem and recently diagnosed synchronous tumor presented with stage IIb (T2 a, N1, M0) non-small cell lung cancer, squamous cell carcinoma presented with left upper lobe lung mass in addition to left hilar adenopathy diagnosed in June 2023.  The patient also has synchronous stage Ia (T1c, N0, M0) non-small cell lung cancer, squamous cell carcinoma presented with right upper lobe lung nodule diagnosed in June 2023. PD-L1 expression was 20%.  PLAN: I had a lengthy discussion with the patient and his cousin today about his current disease stage, prognosis and treatment options.  The patient is not a good surgical candidate for resection. I explained to the patient that he is likely to have synchronous lung cancer involving the left upper lobe and right upper lobe but this could be also stage IV lung cancer involving the left  lung with metastasis to the right upper lobe. I recommended for the patient to proceed with an aggressive approach for his oligometastatic disease with a course of concurrent chemoradiation to the left lung squamous cell carcinoma in addition to stereotactic radiotherapy to the right upper lobe lung nodule. I recommended for the patient a course of concurrent chemoradiation with weekly carboplatin for AUC of 2 and paclitaxel 45 Mg/M2 for 6-7 weeks followed by restaging scan and consideration of systemic treatment with immunotherapy if needed versus observation. I discussed with the patient the adverse effect of the chemotherapy including but not limited to alopecia, myelosuppression, nausea and vomiting, peripheral neuropathy, liver or renal dysfunction. The patient is interested in proceeding with the treatment. He is expected to start the  first cycle of this treatment on July 04, 2022. I will arrange for the patient to have a chemotherapy education class before the first dose of his treatment. The patient mentioned that he has Zofran at home and he will not need additional antiemetics. I will see him back on July 08, 2022 for evaluation and management of any adverse effect of his treatment. The patient was strongly advised to continue with the smoking cessation. He was advised to call immediately if he has any other concerning symptoms in the interval. The patient voices understanding of current disease status and treatment options and is in agreement with the current care plan.  All questions were answered. The patient knows to call the clinic with any problems, questions or concerns. We can certainly see the patient much sooner if necessary.  Thank you so much for allowing me to participate in the care of [name]. I will continue to follow up the patient with you and assist in his care.  The total time spent in the appointment was 90 minutes.  Disclaimer: This note was dictated with voice recognition software. Similar sounding words can inadvertently be transcribed and may not be corrected upon review.   Eilleen Kempf June 24, 2022, 8:42 AM

## 2022-06-24 NOTE — Patient Instructions (Signed)
Thank you for choosing [facility] to provide your care.   Should you have questions after your visit to the [facility]), please contact this office at [phone] between 8:30 AM and 4:30 PM.  Voice mails left after 4:00 PM may not be returned until the following business day.  Calls received after 4:30 PM will be answered by an off-site Nurse Triage Line.    Prescription Refills:  Please have your pharmacy contact us directly for most prescription requests.  Contact the office directly for refills of narcotics (pain medications). Allow 48-72 hours for refills.  Appointments: Please contact the [facility] scheduling department [phone] for questions regarding [facility] appointment scheduling.  Contact the schedulers with any scheduling changes so that your appointment can be rescheduled in a timely manner.   Central Scheduling for [facility] [phone] - Call to schedule procedures such as PET scans, CT scans, MRI, Ultrasound, etc.  To afford each patient quality time with our providers, please arrive 30 minutes before your scheduled appointment time.  If you arrive late for your appointment, you may be asked to reschedule.  We strive to give you quality time with our providers, and arriving late affects you and other patients whose appointments are after yours. If you are a no show for multiple scheduled visits, you may be dismissed from the clinic at the providers discretion.     Resources: [place] [employer] [phone] for additional information on assistance programs or assistance connecting with community support programs   [place]  [phone]: Information regarding food stamps, Medicaid, and utility assistance [employer] [place] [employer]'s shared-ride transportation service for eligible riders who have a disability that prevents them from riding the fixed route bus.   [place] [phone]  Helps people with Medicare understand their rights and benefits, navigate the Medicare system, and secure the quality healthcare they deserve [employer] [phone] Assists patients locate various types of support and financial assistance Cancer Care: 1-800-813-HOPE 947-833-8805) Provides financial assistance, online support groups, medication/co-pay assistance.   Transportation Assistance for appointments at [place] or provider support staff for referral to [place]   Again, thank you for choosing [facility] for your care.

## 2022-06-27 ENCOUNTER — Telehealth: Payer: Self-pay | Admitting: Internal Medicine

## 2022-06-27 ENCOUNTER — Other Ambulatory Visit: Payer: Self-pay

## 2022-06-27 ENCOUNTER — Other Ambulatory Visit: Payer: Self-pay | Admitting: Pulmonary Disease

## 2022-06-27 DIAGNOSIS — J432 Centrilobular emphysema: Secondary | ICD-10-CM

## 2022-06-27 NOTE — Telephone Encounter (Signed)
Scheduled per 07/07 los, patient has been called and notified. 

## 2022-06-28 MED ORDER — CLOPIDOGREL BISULFATE 75 MG PO TABS: 75.0000 mg | ORAL_TABLET | Freq: Every day | ORAL | 3 refills | Status: DC

## 2022-06-28 NOTE — Progress Notes (Addendum)
Pharmacist Chemotherapy Monitoring - Initial Assessment    Anticipated start date: 07/04/22    The following has been reviewed per standard work regarding the patient's treatment regimen: The patient's diagnosis, treatment plan and drug doses, and organ/hematologic function Lab orders and baseline tests specific to treatment regimen  The treatment plan start date, drug sequencing, and pre-medications Prior authorization status  Patient's documented medication list, including drug-drug interaction screen and prescriptions for anti-emetics and supportive care specific to the treatment regimen The drug concentrations, fluid compatibility, administration routes, and timing of the medications to be used The patient's access for treatment and lifetime cumulative dose history, if applicable  The patient's medication allergies and previous infusion related reactions, if applicable   Changes made to treatment plan:  N/A  Follow up needed:  Need Rx for Compazine home use. Pt is receiving Aloxi as premedication. He has Zofran Rx at home.   Raul Del [place], [place], BCPS, BCOP 06/28/2022  2:32 PM

## 2022-06-29 ENCOUNTER — Other Ambulatory Visit: Payer: Self-pay

## 2022-06-29 NOTE — Patient Outreach (Signed)
[facility]) Care Management  06/29/2022  Todd Estrada Sep 29, 1961 [id number]   Telephone outreach to patient to obtain mRS was successfully completed. MRS= [name] Care Management Assistant [phone]

## 2022-06-30 ENCOUNTER — Telehealth: Payer: Self-pay

## 2022-06-30 ENCOUNTER — Encounter: Payer: Self-pay | Admitting: Internal Medicine

## 2022-06-30 NOTE — Telephone Encounter (Signed)
(  1:50 pm )SW completed a telephone call to patient to schedule a palliative care visit with. RN/SW visit is scheduled for 07/06/22 at 11am.

## 2022-07-01 ENCOUNTER — Other Ambulatory Visit: Payer: Self-pay | Admitting: Medical Oncology

## 2022-07-01 ENCOUNTER — Inpatient Hospital Stay: Payer: Medicaid Other

## 2022-07-01 ENCOUNTER — Other Ambulatory Visit: Payer: Self-pay

## 2022-07-01 DIAGNOSIS — Z5111 Encounter for antineoplastic chemotherapy: Secondary | ICD-10-CM

## 2022-07-01 DIAGNOSIS — C3492 Malignant neoplasm of unspecified part of left bronchus or lung: Secondary | ICD-10-CM

## 2022-07-01 DIAGNOSIS — Z510 Encounter for antineoplastic radiation therapy: Secondary | ICD-10-CM | POA: Diagnosis not present

## 2022-07-01 LAB — CBC WITH DIFFERENTIAL (CANCER CENTER ONLY)
Abs Immature Granulocytes: 0.01 10*3/uL (ref 0.00–0.07)
Basophils Absolute: 0 10*3/uL (ref 0.0–0.1)
Basophils Relative: 1 %
Eosinophils Absolute: 0.2 10*3/uL (ref 0.0–0.5)
Eosinophils Relative: 3 %
HCT: 40.9 % (ref 39.0–52.0)
Hemoglobin: 13.5 g/dL (ref 13.0–17.0)
Immature Granulocytes: 0 %
Lymphocytes Relative: 24 %
Lymphs Abs: 1.9 10*3/uL (ref 0.7–4.0)
MCH: 30 pg (ref 26.0–34.0)
MCHC: 33 g/dL (ref 30.0–36.0)
MCV: 90.9 fL (ref 80.0–100.0)
Monocytes Absolute: 0.6 10*3/uL (ref 0.1–1.0)
Monocytes Relative: 8 %
Neutro Abs: 5.1 10*3/uL (ref 1.7–7.7)
Neutrophils Relative %: 64 %
Platelet Count: 176 10*3/uL (ref 150–400)
RBC: 4.5 MIL/uL (ref 4.22–5.81)
RDW: 13.2 % (ref 11.5–15.5)
WBC Count: 7.8 10*3/uL (ref 4.0–10.5)
nRBC: 0 % (ref 0.0–0.2)

## 2022-07-01 LAB — CMP (CANCER CENTER ONLY)
ALT: 10 U/L (ref 0–44)
AST: 15 U/L (ref 15–41)
Albumin: 4.1 g/dL (ref 3.5–5.0)
Alkaline Phosphatase: 89 U/L (ref 38–126)
Anion gap: 6 (ref 5–15)
BUN: 16 mg/dL (ref 8–23)
CO2: 30 mmol/L (ref 22–32)
Calcium: 9.7 mg/dL (ref 8.9–10.3)
Chloride: 104 mmol/L (ref 98–111)
Creatinine: 0.91 mg/dL (ref 0.61–1.24)
GFR, Estimated: >60 mL/min (ref >60)
Glucose, Bld: 98 mg/dL (ref 70–99)
Potassium: 4 mmol/L (ref 3.5–5.1)
Sodium: 140 mmol/L (ref 135–145)
Total Bilirubin: 0.5 mg/dL (ref 0.3–1.2)
Total Protein: 7.4 g/dL (ref 6.5–8.1)

## 2022-07-01 MED ORDER — PROCHLORPERAZINE MALEATE 10 MG PO TABS: 10.0000 mg | ORAL_TABLET | Freq: Four times a day (QID) | ORAL | 0 refills | Status: DC | PRN

## 2022-07-01 MED FILL — Dexamethasone Sodium Phosphate Inj 100 MG/10ML: INTRAMUSCULAR | Qty: 1 | Status: AC

## 2022-07-04 ENCOUNTER — Other Ambulatory Visit: Payer: Self-pay

## 2022-07-04 ENCOUNTER — Inpatient Hospital Stay: Payer: Medicaid Other

## 2022-07-04 ENCOUNTER — Ambulatory Visit
Admission: RE | Admit: 2022-07-04 | Discharge: 2022-07-04 | Disposition: A | Discharge status: Home / Self Care | Payer: Medicaid Other | Source: Ambulatory Visit | Attending: Radiation Oncology | Admitting: Radiation Oncology

## 2022-07-04 VITALS — BP 126/81 | HR 68 | Temp 97.9°F | Resp 20 | Wt 176.5 lb

## 2022-07-04 DIAGNOSIS — Z510 Encounter for antineoplastic radiation therapy: Secondary | ICD-10-CM | POA: Diagnosis not present

## 2022-07-04 DIAGNOSIS — C3492 Malignant neoplasm of unspecified part of left bronchus or lung: Secondary | ICD-10-CM

## 2022-07-04 LAB — RAD ONC ARIA SESSION SUMMARY
Course Elapsed Days: 0
Plan Fractions Treated to Date: 1
Plan Prescribed Dose Per Fraction: 2 Gy
Plan Total Fractions Prescribed: 30
Plan Total Prescribed Dose: 60 Gy
Reference Point Dosage Given to Date: 0.9619 Gy
Reference Point Session Dosage Given: 0.9619 Gy
Session Number: 1

## 2022-07-04 MED ORDER — SODIUM CHLORIDE 0.9 % IV SOLN
45.0000 mg/m2 | Freq: Once | INTRAVENOUS | Status: AC
  Administered 2022-07-04: 90 mg via INTRAVENOUS
  Filled 2022-07-04: qty 15

## 2022-07-04 MED ORDER — SODIUM CHLORIDE 0.9 % IV SOLN
10.0000 mg | Freq: Once | INTRAVENOUS | Status: AC
  Administered 2022-07-04: 10 mg via INTRAVENOUS
  Filled 2022-07-04: qty 10

## 2022-07-04 MED ORDER — SODIUM CHLORIDE 0.9 % IV SOLN: Freq: Once | INTRAVENOUS | Status: AC

## 2022-07-04 MED ORDER — DIPHENHYDRAMINE HCL 50 MG/ML IJ SOLN
50.0000 mg | Freq: Once | INTRAMUSCULAR | Status: AC
  Administered 2022-07-04: 50 mg via INTRAVENOUS
  Filled 2022-07-04: qty 1

## 2022-07-04 MED ORDER — PALONOSETRON HCL INJECTION 0.25 MG/5ML
0.2500 mg | Freq: Once | INTRAVENOUS | Status: AC
  Administered 2022-07-04: 0.25 mg via INTRAVENOUS
  Filled 2022-07-04: qty 5

## 2022-07-04 MED ORDER — FAMOTIDINE IN NACL 20-0.9 MG/50ML-% IV SOLN
20.0000 mg | Freq: Once | INTRAVENOUS | Status: AC
  Administered 2022-07-04: 20 mg via INTRAVENOUS
  Filled 2022-07-04: qty 50

## 2022-07-04 MED ORDER — SODIUM CHLORIDE 0.9 % IV SOLN
240.0000 mg | Freq: Once | INTRAVENOUS | Status: AC
  Administered 2022-07-04: 240 mg via INTRAVENOUS
  Filled 2022-07-04: qty 24

## 2022-07-04 NOTE — Patient Instructions (Signed)
[facility]  Discharge Instructions: Thank you for choosing [facility] to provide your oncology and hematology care.   If you have a lab appointment with the [facility], please go directly to the [facility] and check in at the registration area.   Wear comfortable clothing and clothing appropriate for easy access to any Portacath or PICC line.   We strive to give you quality time with your provider. You may need to reschedule your appointment if you arrive late (15 or more minutes).  Arriving late affects you and other patients whose appointments are after yours.  Also, if you miss three or more appointments without notifying the office, you may be dismissed from the clinic at the provider's discretion.      For prescription refill requests, have your pharmacy contact our office and allow 72 hours for refills to be completed.    Today you received the following chemotherapy and/or immunotherapy agents: Paclitaxel & Carboplatin      To help prevent nausea and vomiting after your treatment, we encourage you to take your nausea medication as directed.  BELOW ARE SYMPTOMS THAT SHOULD BE REPORTED IMMEDIATELY: *FEVER GREATER THAN 100.4 F (38 C) OR HIGHER *CHILLS OR SWEATING *NAUSEA AND VOMITING THAT IS NOT CONTROLLED WITH YOUR NAUSEA MEDICATION *UNUSUAL SHORTNESS OF BREATH *UNUSUAL BRUISING OR BLEEDING *URINARY PROBLEMS (pain or burning when urinating, or frequent urination) *BOWEL PROBLEMS (unusual diarrhea, constipation, pain near the anus) TENDERNESS IN MOUTH AND THROAT WITH OR WITHOUT PRESENCE OF ULCERS (sore throat, sores in mouth, or a toothache) UNUSUAL RASH, SWELLING OR PAIN  UNUSUAL VAGINAL DISCHARGE OR ITCHING   Items with * indicate a potential emergency and should be followed up as soon as possible or go to the Emergency Department if any problems should occur.  Please show the CHEMOTHERAPY ALERT CARD or IMMUNOTHERAPY ALERT CARD  at check-in to the Emergency Department and triage nurse.  Should you have questions after your visit or need to cancel or reschedule your appointment, please contact [facility]  Dept: [phone]  and follow the prompts.  Office hours are 8:00 a.m. to 4:30 p.m. Monday - Friday. Please note that voicemails left after 4:00 p.m. may not be returned until the following business day.  We are closed weekends and major holidays. You have access to a nurse at all times for urgent questions. Please call the main number to the clinic Dept: [phone] and follow the prompts.   For any non-urgent questions, you may also contact your provider using MyChart. We now offer e-Visits for anyone 62 and older to request care online for non-urgent symptoms. For details visit [email or web address].   Also download the MyChart app! Go to the app store, search "MyChart", open the app, select [facility], and log in with your MyChart username and password.  Masks are optional in the cancer centers. If you would like for your care team to wear a mask while they are taking care of you, please let them know. For doctor visits, patients may have with them one support person who is at least 62 years old. At this time, visitors are not allowed in the infusion area.

## 2022-07-05 ENCOUNTER — Ambulatory Visit
Admission: RE | Admit: 2022-07-05 | Discharge: 2022-07-05 | Disposition: A | Discharge status: Home / Self Care | Payer: Medicaid Other | Source: Ambulatory Visit | Attending: Radiation Oncology | Admitting: Radiation Oncology

## 2022-07-05 ENCOUNTER — Other Ambulatory Visit: Payer: Self-pay

## 2022-07-05 ENCOUNTER — Telehealth: Payer: Self-pay | Admitting: *Deleted

## 2022-07-05 DIAGNOSIS — Z510 Encounter for antineoplastic radiation therapy: Secondary | ICD-10-CM | POA: Diagnosis not present

## 2022-07-05 LAB — RAD ONC ARIA SESSION SUMMARY
Course Elapsed Days: 1
Plan Fractions Treated to Date: 2
Plan Prescribed Dose Per Fraction: 2 Gy
Plan Total Fractions Prescribed: 30
Plan Total Prescribed Dose: 60 Gy
Reference Point Dosage Given to Date: 2.9619 Gy
Reference Point Session Dosage Given: 2 Gy
Session Number: 2

## 2022-07-05 NOTE — Telephone Encounter (Signed)
-----   Message from Willis Modena, RN sent at 07/04/2022  1:12 PM EDT ----- Regarding: Dr. Julien Nordmann 1st time Taxol/Carbo f/u tol well Dr. Julien Nordmann 1st time Taxol/Carbo tolerated tx well; Pt has impaired speech--call cousin, Pt call back due

## 2022-07-06 ENCOUNTER — Other Ambulatory Visit: Payer: Self-pay

## 2022-07-06 ENCOUNTER — Ambulatory Visit
Admission: RE | Admit: 2022-07-06 | Discharge: 2022-07-06 | Disposition: A | Discharge status: Home / Self Care | Payer: Medicaid Other | Source: Ambulatory Visit | Attending: Radiation Oncology | Admitting: Radiation Oncology

## 2022-07-06 ENCOUNTER — Other Ambulatory Visit: Payer: Medicaid Other | Admitting: *Deleted

## 2022-07-06 ENCOUNTER — Other Ambulatory Visit: Payer: Medicaid Other

## 2022-07-06 VITALS — BP 111/72 | HR 77 | Temp 97.6°F | Resp 18

## 2022-07-06 DIAGNOSIS — Z515 Encounter for palliative care: Secondary | ICD-10-CM

## 2022-07-06 DIAGNOSIS — Z510 Encounter for antineoplastic radiation therapy: Secondary | ICD-10-CM | POA: Diagnosis not present

## 2022-07-06 LAB — RAD ONC ARIA SESSION SUMMARY
Course Elapsed Days: 2
Plan Fractions Treated to Date: 3
Plan Prescribed Dose Per Fraction: 2 Gy
Plan Total Fractions Prescribed: 30
Plan Total Prescribed Dose: 60 Gy
Reference Point Dosage Given to Date: 4.9619 Gy
Reference Point Session Dosage Given: 2 Gy
Session Number: 3

## 2022-07-06 NOTE — Progress Notes (Signed)
[facility] PALLIATIVE CARE RN NOTE  PATIENT NAME: Todd Estrada DOB: 1961-05-30 MRN: [id number]  PRIMARY CARE PROVIDER: [name], MD  RESPONSIBLE PARTY: Livia Snellen (relative) Acct ID - Guarantor Home Phone Work Phone Relationship Acct Type  [id number] [facility]* [id number]  Self P/F     [address] [place], [place] [address]   RN/SW team completed initial palliative care consult visit today in patient's home. His cousin [name], who lives in the home is also present  Pain: Patient denies pain and no physical indicators of pain noted.   Cognitive: Patient is aphasic and very difficult to understand. He does better being asked yes or no questions. 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 March 2023 and April 2023. Mood is pleasant.  Respiratory: He has Stage IV lung cancer. Currently receiving chemo and radiation treatments. He has had one of each so far without any ill effects. He has a non-productive cough. Lungs clear. Some dyspnea with exertion but recovers fairly quickly at rest. Takes Budesonide neb twice daily and Stiolto inhaler daily. Does not require oxygen.  Mobility: He has right sided weakness due to stroke. Wears a brace on right hand/wrist. Remains ambulatory and is who answered and opened the door when we arrived. Requires 1 person assistance with showers and dressing. He is able to wash himself while in the shower. He has a left hand tremor. [name] assists patient at this time so does not feel they need additional assistance in the home at this time. He used to receive PT/OT through [facility] but has been discharged as he has reached his goals.  Appetite: [name] says when patient was in the hospital he had a difficult time swallowing. At that time he was placed on a mechanical soft diet. This has since improved over the past month since he has returned home. He is still working with [employer] through [facility]. Now he can eat whatever he wants with supervision. Continues on  thin liquids. [name] bought Protein supplement drinks that patient started today. He has drank one so far. He is no longer allowed to drink caffeine due to starting chemo treatments per [name].  GI/GU: Denies any difficulties with urination or bowel movements.  Skin: No skin issues  Goals of care: Patient wishes to remain a Full code. He has not completed a MOST form as of yet but voiced wishes of limited interventions, antibiotics and IV fluids if indicated and feeding tube for a trial period. Does not have a POA/HPOA at this time. He is agreeable to future visits with palliative care.  HISTORY OF PRESENT ILLNESS: This is a 61 yo male with a diagnosis of squamous cell lung cancer. He has a  history of CVA, essential hypertension, emphysema, seizures, and malnutrition. Palliative care team was asked to follow patient for goals of care, symptom management assistance and complex decision making.  CODE STATUS: Full code ADVANCED DIRECTIVES: N MOST FORM: no PPS: 50%   PHYSICAL EXAM:   VITALS: Today's Vitals   07/06/22 1130  BP: 111/72  Pulse: 77  Resp: 18  Temp: 97.6 F (36.4 C)  TempSrc: Temporal  SpO2: 96%  PainSc: 0-No pain    LUNGS: clear to auscultation , nonproductive cough CARDIAC: Cor RRR EXTREMITIES: No edema SKIN:  No skin issues   NEURO:  Alert and oriented person/place; aphasic due to hx of strokes, ambulatory   Daryl Eastern, RN BSN

## 2022-07-07 ENCOUNTER — Ambulatory Visit
Admission: RE | Admit: 2022-07-07 | Discharge: 2022-07-07 | Disposition: A | Discharge status: Home / Self Care | Payer: Medicaid Other | Source: Ambulatory Visit | Attending: Radiation Oncology | Admitting: Radiation Oncology

## 2022-07-07 ENCOUNTER — Other Ambulatory Visit: Payer: Self-pay

## 2022-07-07 DIAGNOSIS — Z510 Encounter for antineoplastic radiation therapy: Secondary | ICD-10-CM | POA: Diagnosis not present

## 2022-07-07 LAB — RAD ONC ARIA SESSION SUMMARY
Course Elapsed Days: 3
Plan Fractions Treated to Date: 4
Plan Prescribed Dose Per Fraction: 2 Gy
Plan Total Fractions Prescribed: 30
Plan Total Prescribed Dose: 60 Gy
Reference Point Dosage Given to Date: 6.9619 Gy
Reference Point Session Dosage Given: 2 Gy
Session Number: 4

## 2022-07-07 NOTE — Progress Notes (Signed)
Pt here for patient teaching.  Pt given Radiation and You booklet, skin care instructions, and Sonafine.  Reviewed areas of pertinence such as fatigue, hair loss, skin changes, throat changes, cough, and shortness of breath . Pt able to give teach back of to pat skin and use unscented/gentle soap,apply Sonafine bid and avoid applying anything to skin within 4 hours of treatment. Pt verbalizes understanding of information given and will contact nursing with any questions or concerns.  Patient is here alone today, education materials reviewed with him and given to him.  Relative [name] was called and voicemail left for him to return call so education could be given to him as well.  Will await return call.  Gloriajean Dell. Leonie Green, BSN

## 2022-07-08 ENCOUNTER — Ambulatory Visit
Admission: RE | Admit: 2022-07-08 | Discharge: 2022-07-08 | Disposition: A | Discharge status: Home / Self Care | Payer: Medicaid Other | Source: Ambulatory Visit | Attending: Radiation Oncology | Admitting: Radiation Oncology

## 2022-07-08 ENCOUNTER — Other Ambulatory Visit: Payer: Self-pay

## 2022-07-08 DIAGNOSIS — C3411 Malignant neoplasm of upper lobe, right bronchus or lung: Secondary | ICD-10-CM

## 2022-07-08 DIAGNOSIS — Z510 Encounter for antineoplastic radiation therapy: Secondary | ICD-10-CM | POA: Diagnosis not present

## 2022-07-08 LAB — RAD ONC ARIA SESSION SUMMARY
Course Elapsed Days: 4
Plan Fractions Treated to Date: 5
Plan Prescribed Dose Per Fraction: 2 Gy
Plan Total Fractions Prescribed: 30
Plan Total Prescribed Dose: 60 Gy
Reference Point Dosage Given to Date: 8.9619 Gy
Reference Point Session Dosage Given: 2 Gy
Session Number: 5

## 2022-07-08 MED ORDER — SONAFINE EX EMUL
1.0000 | Freq: Once | CUTANEOUS | Status: AC
  Administered 2022-07-08: 1 via TOPICAL

## 2022-07-08 MED FILL — Dexamethasone Sodium Phosphate Inj 100 MG/10ML: INTRAMUSCULAR | Qty: 1 | Status: AC

## 2022-07-11 ENCOUNTER — Other Ambulatory Visit: Payer: Self-pay

## 2022-07-11 ENCOUNTER — Inpatient Hospital Stay: Payer: Medicaid Other

## 2022-07-11 ENCOUNTER — Ambulatory Visit
Admission: RE | Admit: 2022-07-11 | Discharge: 2022-07-11 | Disposition: A | Discharge status: Home / Self Care | Payer: Medicaid Other | Source: Ambulatory Visit | Attending: Radiation Oncology | Admitting: Radiation Oncology

## 2022-07-11 VITALS — BP 123/83 | HR 71 | Temp 98.3°F | Resp 18 | Wt 177.2 lb

## 2022-07-11 DIAGNOSIS — Z510 Encounter for antineoplastic radiation therapy: Secondary | ICD-10-CM | POA: Diagnosis not present

## 2022-07-11 DIAGNOSIS — C3492 Malignant neoplasm of unspecified part of left bronchus or lung: Secondary | ICD-10-CM

## 2022-07-11 LAB — CMP (CANCER CENTER ONLY)
ALT: 11 U/L (ref 0–44)
AST: 14 U/L — ABNORMAL LOW (ref 15–41)
Albumin: 4 g/dL (ref 3.5–5.0)
Alkaline Phosphatase: 83 U/L (ref 38–126)
Anion gap: 5 (ref 5–15)
BUN: 19 mg/dL (ref 8–23)
CO2: 29 mmol/L (ref 22–32)
Calcium: 9.6 mg/dL (ref 8.9–10.3)
Chloride: 106 mmol/L (ref 98–111)
Creatinine: 0.71 mg/dL (ref 0.61–1.24)
GFR, Estimated: >60 mL/min (ref >60)
Glucose, Bld: 116 mg/dL — ABNORMAL HIGH (ref 70–99)
Potassium: 4.2 mmol/L (ref 3.5–5.1)
Sodium: 140 mmol/L (ref 135–145)
Total Bilirubin: 0.4 mg/dL (ref 0.3–1.2)
Total Protein: 7 g/dL (ref 6.5–8.1)

## 2022-07-11 LAB — CBC WITH DIFFERENTIAL (CANCER CENTER ONLY)
Abs Immature Granulocytes: 0.04 10*3/uL (ref 0.00–0.07)
Basophils Absolute: 0 10*3/uL (ref 0.0–0.1)
Basophils Relative: 0 %
Eosinophils Absolute: 0.3 10*3/uL (ref 0.0–0.5)
Eosinophils Relative: 4 %
HCT: 40.2 % (ref 39.0–52.0)
Hemoglobin: 13.6 g/dL (ref 13.0–17.0)
Immature Granulocytes: 1 %
Lymphocytes Relative: 15 %
Lymphs Abs: 1.2 10*3/uL (ref 0.7–4.0)
MCH: 30.4 pg (ref 26.0–34.0)
MCHC: 33.8 g/dL (ref 30.0–36.0)
MCV: 89.9 fL (ref 80.0–100.0)
Monocytes Absolute: 0.3 10*3/uL (ref 0.1–1.0)
Monocytes Relative: 4 %
Neutro Abs: 6.1 10*3/uL (ref 1.7–7.7)
Neutrophils Relative %: 76 %
Platelet Count: 203 10*3/uL (ref 150–400)
RBC: 4.47 MIL/uL (ref 4.22–5.81)
RDW: 12.9 % (ref 11.5–15.5)
WBC Count: 8 10*3/uL (ref 4.0–10.5)
nRBC: 0 % (ref 0.0–0.2)

## 2022-07-11 LAB — RAD ONC ARIA SESSION SUMMARY
Course Elapsed Days: 7
Plan Fractions Treated to Date: 6
Plan Prescribed Dose Per Fraction: 2 Gy
Plan Total Fractions Prescribed: 30
Plan Total Prescribed Dose: 60 Gy
Reference Point Dosage Given to Date: 10.9619 Gy
Reference Point Session Dosage Given: 2 Gy
Session Number: 6

## 2022-07-11 MED ORDER — SODIUM CHLORIDE 0.9 % IV SOLN: Freq: Once | INTRAVENOUS | Status: AC

## 2022-07-11 MED ORDER — SODIUM CHLORIDE 0.9 % IV SOLN
45.0000 mg/m2 | Freq: Once | INTRAVENOUS | Status: AC
  Administered 2022-07-11: 90 mg via INTRAVENOUS
  Filled 2022-07-11: qty 15

## 2022-07-11 MED ORDER — PALONOSETRON HCL INJECTION 0.25 MG/5ML
0.2500 mg | Freq: Once | INTRAVENOUS | Status: AC
  Administered 2022-07-11: 0.25 mg via INTRAVENOUS
  Filled 2022-07-11: qty 5

## 2022-07-11 MED ORDER — FAMOTIDINE IN NACL 20-0.9 MG/50ML-% IV SOLN
20.0000 mg | Freq: Once | INTRAVENOUS | Status: AC
  Administered 2022-07-11: 20 mg via INTRAVENOUS
  Filled 2022-07-11: qty 50

## 2022-07-11 MED ORDER — SODIUM CHLORIDE 0.9 % IV SOLN
267.2000 mg | Freq: Once | INTRAVENOUS | Status: AC
  Administered 2022-07-11: 270 mg via INTRAVENOUS
  Filled 2022-07-11: qty 27

## 2022-07-11 MED ORDER — SODIUM CHLORIDE 0.9 % IV SOLN
10.0000 mg | Freq: Once | INTRAVENOUS | Status: AC
  Administered 2022-07-11: 10 mg via INTRAVENOUS
  Filled 2022-07-11: qty 10

## 2022-07-11 MED ORDER — DIPHENHYDRAMINE HCL 50 MG/ML IJ SOLN
50.0000 mg | Freq: Once | INTRAMUSCULAR | Status: AC
  Administered 2022-07-11: 50 mg via INTRAVENOUS
  Filled 2022-07-11: qty 1

## 2022-07-11 NOTE — Patient Instructions (Signed)
[facility]  Discharge Instructions: Thank you for choosing [facility] to provide your oncology and hematology care.   If you have a lab appointment with the [facility], please go directly to the [facility] and check in at the registration area.   Wear comfortable clothing and clothing appropriate for easy access to any Portacath or PICC line.   We strive to give you quality time with your provider. You may need to reschedule your appointment if you arrive late (15 or more minutes).  Arriving late affects you and other patients whose appointments are after yours.  Also, if you miss three or more appointments without notifying the office, you may be dismissed from the clinic at the provider's discretion.      For prescription refill requests, have your pharmacy contact our office and allow 72 hours for refills to be completed.    Today you received the following chemotherapy and/or immunotherapy agents: Paclitaxel & Carboplatin      To help prevent nausea and vomiting after your treatment, we encourage you to take your nausea medication as directed.  BELOW ARE SYMPTOMS THAT SHOULD BE REPORTED IMMEDIATELY: *FEVER GREATER THAN 100.4 F (38 C) OR HIGHER *CHILLS OR SWEATING *NAUSEA AND VOMITING THAT IS NOT CONTROLLED WITH YOUR NAUSEA MEDICATION *UNUSUAL SHORTNESS OF BREATH *UNUSUAL BRUISING OR BLEEDING *URINARY PROBLEMS (pain or burning when urinating, or frequent urination) *BOWEL PROBLEMS (unusual diarrhea, constipation, pain near the anus) TENDERNESS IN MOUTH AND THROAT WITH OR WITHOUT PRESENCE OF ULCERS (sore throat, sores in mouth, or a toothache) UNUSUAL RASH, SWELLING OR PAIN  UNUSUAL VAGINAL DISCHARGE OR ITCHING   Items with * indicate a potential emergency and should be followed up as soon as possible or go to the Emergency Department if any problems should occur.  Please show the CHEMOTHERAPY ALERT CARD or IMMUNOTHERAPY ALERT CARD  at check-in to the Emergency Department and triage nurse.  Should you have questions after your visit or need to cancel or reschedule your appointment, please contact [facility]  Dept: [phone]  and follow the prompts.  Office hours are 8:00 a.m. to 4:30 p.m. Monday - Friday. Please note that voicemails left after 4:00 p.m. may not be returned until the following business day.  We are closed weekends and major holidays. You have access to a nurse at all times for urgent questions. Please call the main number to the clinic Dept: [phone] and follow the prompts.   For any non-urgent questions, you may also contact your provider using MyChart. We now offer e-Visits for anyone 52 and older to request care online for non-urgent symptoms. For details visit [email or web address].   Also download the MyChart app! Go to the app store, search "MyChart", open the app, select [facility], and log in with your MyChart username and password.  Masks are optional in the cancer centers. If you would like for your care team to wear a mask while they are taking care of you, please let them know. For doctor visits, patients may have with them one support person who is at least 62 years old. At this time, visitors are not allowed in the infusion area.

## 2022-07-11 NOTE — Progress Notes (Signed)
Patient interviewed with cousin- patient has had constipation for three days and his cough seems to him to be worse. Discussed with Dr. Julien Nordmann. Patient given instructions to take half a bottle of milk of magnesia and miralax daily. Ok to proceed with today's treatment.

## 2022-07-12 ENCOUNTER — Ambulatory Visit
Admission: RE | Admit: 2022-07-12 | Discharge: 2022-07-12 | Disposition: A | Discharge status: Home / Self Care | Payer: Medicaid Other | Source: Ambulatory Visit | Attending: Radiation Oncology | Admitting: Radiation Oncology

## 2022-07-12 ENCOUNTER — Other Ambulatory Visit: Payer: Self-pay

## 2022-07-12 ENCOUNTER — Ambulatory Visit: Payer: Medicaid Other | Admitting: Radiation Oncology

## 2022-07-12 DIAGNOSIS — Z510 Encounter for antineoplastic radiation therapy: Secondary | ICD-10-CM | POA: Diagnosis not present

## 2022-07-12 LAB — RAD ONC ARIA SESSION SUMMARY
Course Elapsed Days: 8
Plan Fractions Treated to Date: 7
Plan Prescribed Dose Per Fraction: 2 Gy
Plan Total Fractions Prescribed: 30
Plan Total Prescribed Dose: 60 Gy
Reference Point Dosage Given to Date: 12.9619 Gy
Reference Point Session Dosage Given: 2 Gy
Session Number: 7

## 2022-07-13 ENCOUNTER — Ambulatory Visit
Admission: RE | Admit: 2022-07-13 | Discharge: 2022-07-13 | Disposition: A | Discharge status: Home / Self Care | Payer: Medicaid Other | Source: Ambulatory Visit | Attending: Radiation Oncology | Admitting: Radiation Oncology

## 2022-07-13 ENCOUNTER — Other Ambulatory Visit: Payer: Self-pay

## 2022-07-13 DIAGNOSIS — Z510 Encounter for antineoplastic radiation therapy: Secondary | ICD-10-CM | POA: Diagnosis not present

## 2022-07-13 LAB — RAD ONC ARIA SESSION SUMMARY
Course Elapsed Days: 9
Plan Fractions Treated to Date: 8
Plan Prescribed Dose Per Fraction: 2 Gy
Plan Total Fractions Prescribed: 30
Plan Total Prescribed Dose: 60 Gy
Reference Point Dosage Given to Date: 14.9619 Gy
Reference Point Session Dosage Given: 2 Gy
Session Number: 8

## 2022-07-14 ENCOUNTER — Other Ambulatory Visit: Payer: Self-pay

## 2022-07-14 ENCOUNTER — Ambulatory Visit
Admission: RE | Admit: 2022-07-14 | Discharge: 2022-07-14 | Disposition: A | Discharge status: Home / Self Care | Payer: Medicaid Other | Source: Ambulatory Visit | Attending: Radiation Oncology | Admitting: Radiation Oncology

## 2022-07-14 ENCOUNTER — Ambulatory Visit: Payer: Medicaid Other | Admitting: Radiation Oncology

## 2022-07-14 DIAGNOSIS — Z510 Encounter for antineoplastic radiation therapy: Secondary | ICD-10-CM | POA: Diagnosis not present

## 2022-07-14 LAB — RAD ONC ARIA SESSION SUMMARY
Course Elapsed Days: 10
Plan Fractions Treated to Date: 9
Plan Prescribed Dose Per Fraction: 2 Gy
Plan Total Fractions Prescribed: 30
Plan Total Prescribed Dose: 60 Gy
Reference Point Dosage Given to Date: 16.9619 Gy
Reference Point Session Dosage Given: 2 Gy
Session Number: 9

## 2022-07-15 ENCOUNTER — Ambulatory Visit
Admission: RE | Admit: 2022-07-15 | Discharge: 2022-07-15 | Disposition: A | Discharge status: Home / Self Care | Payer: Medicaid Other | Source: Ambulatory Visit | Attending: Radiation Oncology | Admitting: Radiation Oncology

## 2022-07-15 ENCOUNTER — Other Ambulatory Visit: Payer: Self-pay

## 2022-07-15 DIAGNOSIS — Z510 Encounter for antineoplastic radiation therapy: Secondary | ICD-10-CM | POA: Diagnosis not present

## 2022-07-15 LAB — RAD ONC ARIA SESSION SUMMARY
Course Elapsed Days: 11
Plan Fractions Treated to Date: 10
Plan Prescribed Dose Per Fraction: 2 Gy
Plan Total Fractions Prescribed: 30
Plan Total Prescribed Dose: 60 Gy
Reference Point Dosage Given to Date: 18.9619 Gy
Reference Point Session Dosage Given: 2 Gy
Session Number: 10

## 2022-07-15 MED FILL — Dexamethasone Sodium Phosphate Inj 100 MG/10ML: INTRAMUSCULAR | Qty: 1 | Status: AC

## 2022-07-18 ENCOUNTER — Inpatient Hospital Stay: Payer: Medicaid Other

## 2022-07-18 ENCOUNTER — Inpatient Hospital Stay (HOSPITAL_BASED_OUTPATIENT_CLINIC_OR_DEPARTMENT_OTHER): Payer: Medicaid Other | Admitting: Internal Medicine

## 2022-07-18 ENCOUNTER — Other Ambulatory Visit: Payer: Self-pay

## 2022-07-18 ENCOUNTER — Encounter: Payer: Self-pay | Admitting: Internal Medicine

## 2022-07-18 ENCOUNTER — Ambulatory Visit
Admission: RE | Admit: 2022-07-18 | Discharge: 2022-07-18 | Disposition: A | Discharge status: Home / Self Care | Payer: Medicaid Other | Source: Ambulatory Visit | Attending: Radiation Oncology | Admitting: Radiation Oncology

## 2022-07-18 VITALS — BP 120/77 | HR 70 | Resp 17

## 2022-07-18 VITALS — BP 106/77 | HR 87 | Temp 98.4°F | Resp 15 | Wt 172.0 lb

## 2022-07-18 DIAGNOSIS — Z510 Encounter for antineoplastic radiation therapy: Secondary | ICD-10-CM | POA: Diagnosis not present

## 2022-07-18 DIAGNOSIS — C3492 Malignant neoplasm of unspecified part of left bronchus or lung: Secondary | ICD-10-CM | POA: Diagnosis not present

## 2022-07-18 DIAGNOSIS — Z5111 Encounter for antineoplastic chemotherapy: Secondary | ICD-10-CM

## 2022-07-18 LAB — CBC WITH DIFFERENTIAL (CANCER CENTER ONLY)
Abs Immature Granulocytes: 0.02 10*3/uL (ref 0.00–0.07)
Basophils Absolute: 0.1 10*3/uL (ref 0.0–0.1)
Basophils Relative: 1 %
Eosinophils Absolute: 0.3 10*3/uL (ref 0.0–0.5)
Eosinophils Relative: 4 %
HCT: 38 % — ABNORMAL LOW (ref 39.0–52.0)
Hemoglobin: 12.9 g/dL — ABNORMAL LOW (ref 13.0–17.0)
Immature Granulocytes: 0 %
Lymphocytes Relative: 17 %
Lymphs Abs: 1.1 10*3/uL (ref 0.7–4.0)
MCH: 29.9 pg (ref 26.0–34.0)
MCHC: 33.9 g/dL (ref 30.0–36.0)
MCV: 88 fL (ref 80.0–100.0)
Monocytes Absolute: 0.4 10*3/uL (ref 0.1–1.0)
Monocytes Relative: 6 %
Neutro Abs: 4.5 10*3/uL (ref 1.7–7.7)
Neutrophils Relative %: 72 %
Platelet Count: 191 10*3/uL (ref 150–400)
RBC: 4.32 MIL/uL (ref 4.22–5.81)
RDW: 12.8 % (ref 11.5–15.5)
WBC Count: 6.3 10*3/uL (ref 4.0–10.5)
nRBC: 0 % (ref 0.0–0.2)

## 2022-07-18 LAB — CMP (CANCER CENTER ONLY)
ALT: 12 U/L (ref 0–44)
AST: 13 U/L — ABNORMAL LOW (ref 15–41)
Albumin: 4 g/dL (ref 3.5–5.0)
Alkaline Phosphatase: 72 U/L (ref 38–126)
Anion gap: 7 (ref 5–15)
BUN: 25 mg/dL — ABNORMAL HIGH (ref 8–23)
CO2: 25 mmol/L (ref 22–32)
Calcium: 9.4 mg/dL (ref 8.9–10.3)
Chloride: 104 mmol/L (ref 98–111)
Creatinine: 0.75 mg/dL (ref 0.61–1.24)
GFR, Estimated: >60 mL/min (ref >60)
Glucose, Bld: 119 mg/dL — ABNORMAL HIGH (ref 70–99)
Potassium: 4.3 mmol/L (ref 3.5–5.1)
Sodium: 136 mmol/L (ref 135–145)
Total Bilirubin: 0.7 mg/dL (ref 0.3–1.2)
Total Protein: 7.3 g/dL (ref 6.5–8.1)

## 2022-07-18 LAB — RAD ONC ARIA SESSION SUMMARY
Course Elapsed Days: 14
Plan Fractions Treated to Date: 11
Plan Prescribed Dose Per Fraction: 2 Gy
Plan Total Fractions Prescribed: 30
Plan Total Prescribed Dose: 60 Gy
Reference Point Dosage Given to Date: 20.9619 Gy
Reference Point Session Dosage Given: 2 Gy
Session Number: 11

## 2022-07-18 MED ORDER — SODIUM CHLORIDE 0.9 % IV SOLN
267.2000 mg | Freq: Once | INTRAVENOUS | Status: AC
  Administered 2022-07-18: 270 mg via INTRAVENOUS
  Filled 2022-07-18: qty 27

## 2022-07-18 MED ORDER — SODIUM CHLORIDE 0.9 % IV SOLN
10.0000 mg | Freq: Once | INTRAVENOUS | Status: AC
  Administered 2022-07-18: 10 mg via INTRAVENOUS
  Filled 2022-07-18: qty 10

## 2022-07-18 MED ORDER — PALONOSETRON HCL INJECTION 0.25 MG/5ML
0.2500 mg | Freq: Once | INTRAVENOUS | Status: AC
  Administered 2022-07-18: 0.25 mg via INTRAVENOUS
  Filled 2022-07-18: qty 5

## 2022-07-18 MED ORDER — SODIUM CHLORIDE 0.9 % IV SOLN
45.0000 mg/m2 | Freq: Once | INTRAVENOUS | Status: AC
  Administered 2022-07-18: 90 mg via INTRAVENOUS
  Filled 2022-07-18: qty 15

## 2022-07-18 MED ORDER — FAMOTIDINE IN NACL 20-0.9 MG/50ML-% IV SOLN
20.0000 mg | Freq: Once | INTRAVENOUS | Status: AC
  Administered 2022-07-18: 20 mg via INTRAVENOUS
  Filled 2022-07-18: qty 50

## 2022-07-18 MED ORDER — DIPHENHYDRAMINE HCL 50 MG/ML IJ SOLN
50.0000 mg | Freq: Once | INTRAMUSCULAR | Status: AC
  Administered 2022-07-18: 50 mg via INTRAVENOUS
  Filled 2022-07-18: qty 1

## 2022-07-18 MED ORDER — SODIUM CHLORIDE 0.9 % IV SOLN: Freq: Once | INTRAVENOUS | Status: AC

## 2022-07-18 NOTE — Progress Notes (Signed)
[facility] Telephone:([phone]) [phone]   Fax:(336) [phone]  OFFICE PROGRESS NOTE  Todd Axe, MD [place] [address]  DIAGNOSIS:  Stage IIb (T2 a, N1, M0) non-small cell lung cancer, squamous cell carcinoma presented with left upper lobe lung mass in addition to left hilar adenopathy diagnosed in June 2023.  The patient also has synchronous stage Ia (T1c, N0, M0) non-small cell lung cancer, squamous cell carcinoma presented with right upper lobe lung nodule diagnosed in June 2023. PD-L1 expression was 20%.  PRIOR THERAPY: None  CURRENT THERAPY: Concurrent chemoradiation with weekly carboplatin for AUC of 2 and paclitaxel 45 Mg/M2.  First dose 07/04/2022.  Status post post 2 cycles.  INTERVAL HISTORY: Todd Estrada 61 y.o. male returns to the clinic today for follow-up visit.  The patient is feeling fine today with no concerning complaints.  He denied having any chest pain, shortness of breath but has mild cough with no hemoptysis.  He denied having any nausea, vomiting, diarrhea or constipation.  He has no headache or visual changes.  He denied having any fever or chills.  He continues to tolerate this course of concurrent chemoradiation fairly well.  The patient is here today for evaluation before starting cycle #3.  MEDICAL HISTORY: Past Medical History:  Diagnosis Date   Asthma    COPD (chronic obstructive pulmonary disease) (HCC)    GERD (gastroesophageal reflux disease)    History of tracheostomy    03/09/22-04/11/22   HLD (hyperlipidemia)    Hypertension    PAD (peripheral artery disease) (HCC)    Seizures ([facility]) 06/02/2022   Stroke ([facility]) 02/2022    ALLERGIES:  has No Known Allergies.  MEDICATIONS:  Current Outpatient Medications  Medication Sig Dispense Refill   acetaminophen (TYLENOL) 325 MG tablet Take 1-2 tablets (325-650 mg total) by mouth every 4 (four) hours as needed for mild pain. (Patient not taking: Reported on 06/24/2022)      atorvastatin (LIPITOR) 40 MG tablet Take 1 tablet (40 mg total) by mouth daily. 90 tablet 3   budesonide (PULMICORT) 0.5 MG/2ML nebulizer solution USE 2 ML(0.5 MG) VIA NEBULIZER TWICE DAILY 120 mL 11   clopidogrel (PLAVIX) 75 MG tablet Take 1 tablet (75 mg total) by mouth daily. Okay to restart this medication on 05/31/2022 90 tablet 3   folic acid (FOLVITE) 1 MG tablet Take 1 tablet (1 mg total) by mouth daily. 30 tablet 0   levETIRAcetam (KEPPRA) 500 MG tablet Take 1 tablet (500 mg total) by mouth 2 (two) times daily. 180 tablet 3   Multiple Vitamin (MULTIVITAMIN WITH MINERALS) TABS tablet Take 1 tablet by mouth daily. 30 tablet 0   nicotine (NICODERM CQ - DOSED IN MG/24 HOURS) 21 mg/24hr patch Place 21 mg onto the skin daily.     ondansetron (ZOFRAN) 4 MG tablet Take 4 mg by mouth every 8 (eight) hours as needed for nausea or vomiting. (Patient not taking: Reported on 07/18/2022)     pantoprazole (PROTONIX) 40 MG tablet Take 1 tablet (40 mg total) by mouth daily. 30 tablet 0   prochlorperazine (COMPAZINE) 10 MG tablet Take 1 tablet (10 mg total) by mouth every 6 (six) hours as needed for nausea or vomiting. (Patient not taking: Reported on 07/18/2022) 30 tablet 0   STIOLTO RESPIMAT 2.5-2.5 MCG/ACT AERS INHALE 2 PUFFS INTO THE LUNGS DAILY 4 g 5   thiamine 100 MG tablet Take 1 tablet (100 mg total) by mouth daily. (Patient  not taking: Reported on 06/24/2022) 30 tablet 2   No current facility-administered medications for this visit.    SURGICAL HISTORY:  Past Surgical History:  Procedure Laterality Date   BRONCHIAL BIOPSY  05/30/2022   Procedure: BRONCHIAL BIOPSIES;  Surgeon: Collene Gobble, MD;  Location: [facility] ENDOSCOPY;  Service: Pulmonary;;   BRONCHIAL BRUSHINGS  05/30/2022   Procedure: BRONCHIAL BRUSHINGS;  Surgeon: Collene Gobble, MD;  Location: [facility] ENDOSCOPY;  Service: Pulmonary;;   BRONCHIAL NEEDLE ASPIRATION BIOPSY  05/30/2022   Procedure: BRONCHIAL NEEDLE ASPIRATION BIOPSIES;  Surgeon: Collene Gobble, MD;  Location: [facility] ENDOSCOPY;  Service: Pulmonary;;   BRONCHIAL WASHINGS  05/30/2022   Procedure: BRONCHIAL WASHINGS;  Surgeon: Collene Gobble, MD;  Location[facility] ENDOSCOPY;  Service: Pulmonary;;   ESOPHAGOGASTRODUODENOSCOPY (EGD) WITH PROPOFOL N/A 03/11/2022   Procedure: ESOPHAGOGASTRODUODENOSCOPY (EGD) WITH PROPOFOL;  Surgeon: Georganna Skeans, MD;  Location: [facility];  Service: General;  Laterality: N/A;   FIDUCIAL MARKER PLACEMENT  05/30/2022   Procedure: FIDUCIAL MARKER PLACEMENT;  Surgeon: Collene Gobble, MD;  Location: [facility] ENDOSCOPY;  Service: Pulmonary;;   IR ANGIO INTRA EXTRACRAN SEL COM CAROTID INNOMINATE UNI R MOD SED  03/02/2022   IR CT HEAD LTD  03/02/2022   IR PERCUTANEOUS ART THROMBECTOMY/INFUSION INTRACRANIAL INC DIAG ANGIO  03/02/2022   PEG PLACEMENT N/A 03/11/2022   Procedure: PERCUTANEOUS ENDOSCOPIC GASTROSTOMY (PEG) PLACEMENT;  Surgeon: Georganna Skeans, MD;  Location: [facility];  Service: General;  Laterality: N/A;   RADIOLOGY WITH ANESTHESIA N/A 03/02/2022   Procedure: IR WITH ANESTHESIA;  Surgeon: Luanne Bras, MD;  Location: [facility];  Service: Radiology;  Laterality: N/A;   VIDEO BRONCHOSCOPY WITH RADIAL ENDOBRONCHIAL ULTRASOUND  05/30/2022   Procedure: VIDEO BRONCHOSCOPY WITH RADIAL ENDOBRONCHIAL ULTRASOUND;  Surgeon: Collene Gobble, MD;  Location: [facility] ENDOSCOPY;  Service: Pulmonary;;    REVIEW OF SYSTEMS:  A comprehensive review of systems was negative except for: Respiratory: positive for cough   PHYSICAL EXAMINATION: General appearance: alert, cooperative, fatigued, and no distress Head: Normocephalic, without obvious abnormality, atraumatic Neck: no adenopathy, no JVD, supple, symmetrical, trachea midline, and thyroid not enlarged, symmetric, no tenderness/mass/nodules Lymph nodes: Cervical, supraclavicular, and axillary nodes normal. Resp: clear to auscultation bilaterally Back: symmetric, no curvature. ROM normal. No CVA tenderness. Cardio: regular  rate and rhythm, S1, S2 normal, no murmur, click, rub or gallop GI: soft, non-tender; bowel sounds normal; no masses,  no organomegaly Extremities: extremities normal, atraumatic, no cyanosis or edema  ECOG PERFORMANCE STATUS: 1 - Symptomatic but completely ambulatory  Blood pressure 106/77, pulse 87, temperature 98.4 F (36.9 C), temperature source Oral, resp. rate 15, weight 172 lb (78 kg), SpO2 99 %.  LABORATORY DATA: Lab Results  Component Value Date   WBC 6.3 07/18/2022   HGB 12.9 (L) 07/18/2022   HCT 38.0 (L) 07/18/2022   MCV 88.0 07/18/2022   PLT 191 07/18/2022      Chemistry      Component Value Date/Time   NA 136 07/18/2022 0840   K 4.3 07/18/2022 0840   CL 104 07/18/2022 0840   CO2 25 07/18/2022 0840   BUN 25 (H) 07/18/2022 0840   CREATININE 0.75 07/18/2022 0840      Component Value Date/Time   CALCIUM 9.4 07/18/2022 0840   ALKPHOS 72 07/18/2022 0840   AST 13 (L) 07/18/2022 0840   ALT 12 07/18/2022 0840   BILITOT 0.7 07/18/2022 0840       RADIOGRAPHIC STUDIES: No results found.  ASSESSMENT AND PLAN: This is  a very pleasant 61 years old white male with  stage IIb (T2 a, N1, M0) non-small cell lung cancer, squamous cell carcinoma presented with left upper lobe lung mass in addition to left hilar adenopathy diagnosed in June 2023.  The patient also has synchronous stage Ia (T1c, N0, M0) non-small cell lung cancer, squamous cell carcinoma presented with right upper lobe lung nodule diagnosed in June 2023. PD-L1 expression was 20%. The patient is currently undergoing a course of concurrent chemoradiation with weekly carboplatin for AUC of 2 and paclitaxel 45 Mg/M2 status post 2 cycles.  He has been tolerating this treatment well with no concerning adverse effects. I recommended for the patient to proceed with cycle #3 today as planned. He will come back for follow-up visit in 2 weeks for evaluation before starting cycle #5. The patient was advised to call  immediately if he has any other concerning symptoms in the interval. The patient voices understanding of current disease status and treatment options and is in agreement with the current care plan.  All questions were answered. The patient knows to call the clinic with any problems, questions or concerns. We can certainly see the patient much sooner if necessary.  The total time spent in the appointment was 20 minutes.  Disclaimer: This note was dictated with voice recognition software. Similar sounding words can inadvertently be transcribed and may not be corrected upon review.

## 2022-07-19 ENCOUNTER — Other Ambulatory Visit: Payer: Self-pay

## 2022-07-19 ENCOUNTER — Ambulatory Visit: Payer: Medicaid Other | Admitting: Radiation Oncology

## 2022-07-19 ENCOUNTER — Ambulatory Visit
Admission: RE | Admit: 2022-07-19 | Discharge: 2022-07-19 | Disposition: A | Discharge status: Home / Self Care | Payer: Medicaid Other | Source: Ambulatory Visit | Attending: Radiation Oncology | Admitting: Radiation Oncology

## 2022-07-19 DIAGNOSIS — C3411 Malignant neoplasm of upper lobe, right bronchus or lung: Secondary | ICD-10-CM | POA: Insufficient documentation

## 2022-07-19 DIAGNOSIS — Z87891 Personal history of nicotine dependence: Secondary | ICD-10-CM | POA: Diagnosis not present

## 2022-07-19 DIAGNOSIS — Z510 Encounter for antineoplastic radiation therapy: Secondary | ICD-10-CM | POA: Insufficient documentation

## 2022-07-19 DIAGNOSIS — C3412 Malignant neoplasm of upper lobe, left bronchus or lung: Secondary | ICD-10-CM | POA: Diagnosis present

## 2022-07-19 DIAGNOSIS — Z5111 Encounter for antineoplastic chemotherapy: Secondary | ICD-10-CM | POA: Insufficient documentation

## 2022-07-19 LAB — RAD ONC ARIA SESSION SUMMARY
Course Elapsed Days: 15
Plan Fractions Treated to Date: 12
Plan Prescribed Dose Per Fraction: 2 Gy
Plan Total Fractions Prescribed: 30
Plan Total Prescribed Dose: 60 Gy
Reference Point Dosage Given to Date: 22.9619 Gy
Reference Point Session Dosage Given: 2 Gy
Session Number: 12

## 2022-07-20 ENCOUNTER — Other Ambulatory Visit: Payer: Self-pay

## 2022-07-20 ENCOUNTER — Ambulatory Visit
Admission: RE | Admit: 2022-07-20 | Discharge: 2022-07-20 | Disposition: A | Discharge status: Home / Self Care | Payer: Medicaid Other | Source: Ambulatory Visit | Attending: Radiation Oncology | Admitting: Radiation Oncology

## 2022-07-20 DIAGNOSIS — Z510 Encounter for antineoplastic radiation therapy: Secondary | ICD-10-CM | POA: Diagnosis not present

## 2022-07-20 LAB — RAD ONC ARIA SESSION SUMMARY
Course Elapsed Days: 16
Plan Fractions Treated to Date: 13
Plan Prescribed Dose Per Fraction: 2 Gy
Plan Total Fractions Prescribed: 30
Plan Total Prescribed Dose: 60 Gy
Reference Point Dosage Given to Date: 24.9619 Gy
Reference Point Session Dosage Given: 2 Gy
Session Number: 13

## 2022-07-21 ENCOUNTER — Ambulatory Visit
Admission: RE | Admit: 2022-07-21 | Discharge: 2022-07-21 | Disposition: A | Discharge status: Home / Self Care | Payer: Medicaid Other | Source: Ambulatory Visit | Attending: Radiation Oncology | Admitting: Radiation Oncology

## 2022-07-21 ENCOUNTER — Other Ambulatory Visit: Payer: Self-pay

## 2022-07-21 DIAGNOSIS — Z510 Encounter for antineoplastic radiation therapy: Secondary | ICD-10-CM | POA: Diagnosis not present

## 2022-07-21 LAB — RAD ONC ARIA SESSION SUMMARY
Course Elapsed Days: 17
Plan Fractions Treated to Date: 14
Plan Prescribed Dose Per Fraction: 2 Gy
Plan Total Fractions Prescribed: 30
Plan Total Prescribed Dose: 60 Gy
Reference Point Dosage Given to Date: 26.9619 Gy
Reference Point Session Dosage Given: 2 Gy
Session Number: 14

## 2022-07-21 NOTE — Progress Notes (Signed)
[facility] SW NOTE  PATIENT NAME: NICHAEL EHLY DOB: 1961/08/01 MRN: [id number]  PRIMARY CARE PROVIDER: [name], MD  RESPONSIBLE PARTY:  Acct ID - Guarantor Home Phone Work Phone Relationship Acct Type  [id number] [phone]  Self P/F     [address] [place], [place] [address]   SOCIAL WORK ENCOUNTER  SW and RN-M Nadara Mustard completed an initial visit with patient at his home. His cousin/PCG-James was present with him. Patient was sitting up on the side of the bed, appearing to be alert and oriented x3, but has been communication deficits due to past strokes. He indicated he was in no pain. Patient's cousin provided information regarding patient status. Patient's appetite is good where he is eating at least 2 meals a day, which includes 1-2 Ensure per day. Patient requires assistance from his cousin with personal care tasks. Patient is currently receiving treatments for his cancer and is tolerating them well. SW provided education regarding PCS services that could be an available resource for in-home care for patient. Patient and his cousin declined stating that it was not needed at this time. When discussing advance directives and code status, patient and his cousin reported that patient desires to be a DNR. SW to report this to primary provider for follow-up.  Patient or his cousin verbalized no other needs. Contact information for palliative care support was provided and they were encouraged to call with any questions or concerns. They remain open to ongoing palliative care visits and support.   CODE STATUS: DNR ADVANCED DIRECTIVES: No MOST FORM COMPLETE:  No HOSPICE EDUCATION PROVIDED: No  Duration of visit and documentation: 60 minutes  Janyla Biscoe, LCSW

## 2022-07-22 ENCOUNTER — Ambulatory Visit
Admission: RE | Admit: 2022-07-22 | Discharge: 2022-07-22 | Disposition: A | Discharge status: Home / Self Care | Payer: Medicaid Other | Source: Ambulatory Visit | Attending: Radiation Oncology | Admitting: Radiation Oncology

## 2022-07-22 ENCOUNTER — Other Ambulatory Visit: Payer: Self-pay

## 2022-07-22 ENCOUNTER — Other Ambulatory Visit: Payer: Self-pay | Admitting: *Deleted

## 2022-07-22 DIAGNOSIS — Z510 Encounter for antineoplastic radiation therapy: Secondary | ICD-10-CM | POA: Diagnosis not present

## 2022-07-22 LAB — RAD ONC ARIA SESSION SUMMARY
Course Elapsed Days: 18
Plan Fractions Treated to Date: 15
Plan Prescribed Dose Per Fraction: 2 Gy
Plan Total Fractions Prescribed: 30
Plan Total Prescribed Dose: 60 Gy
Reference Point Dosage Given to Date: 28.9619 Gy
Reference Point Session Dosage Given: 2 Gy
Session Number: 15

## 2022-07-22 MED ORDER — NICOTINE 14 MG/24HR TD PT24
14.0000 mg | MEDICATED_PATCH | Freq: Every day | TRANSDERMAL | 0 refills | Status: DC
Start: 2022-07-22 — End: 2022-09-20

## 2022-07-22 MED FILL — Dexamethasone Sodium Phosphate Inj 100 MG/10ML: INTRAMUSCULAR | Qty: 1 | Status: AC

## 2022-07-25 ENCOUNTER — Other Ambulatory Visit: Payer: Self-pay

## 2022-07-25 ENCOUNTER — Inpatient Hospital Stay: Payer: Medicaid Other

## 2022-07-25 ENCOUNTER — Ambulatory Visit: Payer: Medicaid Other

## 2022-07-25 VITALS — BP 115/82 | HR 69 | Temp 98.6°F | Resp 18 | Wt 170.2 lb

## 2022-07-25 DIAGNOSIS — C3412 Malignant neoplasm of upper lobe, left bronchus or lung: Secondary | ICD-10-CM | POA: Insufficient documentation

## 2022-07-25 DIAGNOSIS — Z5111 Encounter for antineoplastic chemotherapy: Secondary | ICD-10-CM | POA: Insufficient documentation

## 2022-07-25 DIAGNOSIS — Z87891 Personal history of nicotine dependence: Secondary | ICD-10-CM | POA: Insufficient documentation

## 2022-07-25 DIAGNOSIS — Z79899 Other long term (current) drug therapy: Secondary | ICD-10-CM | POA: Insufficient documentation

## 2022-07-25 DIAGNOSIS — Z510 Encounter for antineoplastic radiation therapy: Secondary | ICD-10-CM | POA: Diagnosis not present

## 2022-07-25 DIAGNOSIS — C3492 Malignant neoplasm of unspecified part of left bronchus or lung: Secondary | ICD-10-CM

## 2022-07-25 DIAGNOSIS — C3411 Malignant neoplasm of upper lobe, right bronchus or lung: Secondary | ICD-10-CM | POA: Insufficient documentation

## 2022-07-25 LAB — CMP (CANCER CENTER ONLY)
ALT: 15 U/L (ref 0–44)
AST: 15 U/L (ref 15–41)
Albumin: 4.1 g/dL (ref 3.5–5.0)
Alkaline Phosphatase: 83 U/L (ref 38–126)
Anion gap: 6 (ref 5–15)
BUN: 15 mg/dL (ref 8–23)
CO2: 28 mmol/L (ref 22–32)
Calcium: 9.3 mg/dL (ref 8.9–10.3)
Chloride: 103 mmol/L (ref 98–111)
Creatinine: 0.73 mg/dL (ref 0.61–1.24)
GFR, Estimated: >60 mL/min (ref >60)
Glucose, Bld: 117 mg/dL — ABNORMAL HIGH (ref 70–99)
Potassium: 4.3 mmol/L (ref 3.5–5.1)
Sodium: 137 mmol/L (ref 135–145)
Total Bilirubin: 0.5 mg/dL (ref 0.3–1.2)
Total Protein: 7.3 g/dL (ref 6.5–8.1)

## 2022-07-25 LAB — CBC WITH DIFFERENTIAL (CANCER CENTER ONLY)
Abs Immature Granulocytes: 0.02 10*3/uL (ref 0.00–0.07)
Basophils Absolute: 0 10*3/uL (ref 0.0–0.1)
Basophils Relative: 1 %
Eosinophils Absolute: 0 10*3/uL (ref 0.0–0.5)
Eosinophils Relative: 1 %
HCT: 38.7 % — ABNORMAL LOW (ref 39.0–52.0)
Hemoglobin: 13.3 g/dL (ref 13.0–17.0)
Immature Granulocytes: 1 %
Lymphocytes Relative: 17 %
Lymphs Abs: 0.7 10*3/uL (ref 0.7–4.0)
MCH: 30.3 pg (ref 26.0–34.0)
MCHC: 34.4 g/dL (ref 30.0–36.0)
MCV: 88.2 fL (ref 80.0–100.0)
Monocytes Absolute: 0.3 10*3/uL (ref 0.1–1.0)
Monocytes Relative: 8 %
Neutro Abs: 3.1 10*3/uL (ref 1.7–7.7)
Neutrophils Relative %: 72 %
Platelet Count: 195 10*3/uL (ref 150–400)
RBC: 4.39 MIL/uL (ref 4.22–5.81)
RDW: 13 % (ref 11.5–15.5)
WBC Count: 4.2 10*3/uL (ref 4.0–10.5)
nRBC: 0 % (ref 0.0–0.2)

## 2022-07-25 MED ORDER — PALONOSETRON HCL INJECTION 0.25 MG/5ML
0.2500 mg | Freq: Once | INTRAVENOUS | Status: AC
  Administered 2022-07-25: 0.25 mg via INTRAVENOUS
  Filled 2022-07-25: qty 5

## 2022-07-25 MED ORDER — SODIUM CHLORIDE 0.9 % IV SOLN: Freq: Once | INTRAVENOUS | Status: AC

## 2022-07-25 MED ORDER — DIPHENHYDRAMINE HCL 50 MG/ML IJ SOLN
50.0000 mg | Freq: Once | INTRAMUSCULAR | Status: AC
  Administered 2022-07-25: 50 mg via INTRAVENOUS
  Filled 2022-07-25: qty 1

## 2022-07-25 MED ORDER — SODIUM CHLORIDE 0.9 % IV SOLN
267.2000 mg | Freq: Once | INTRAVENOUS | Status: AC
  Administered 2022-07-25: 270 mg via INTRAVENOUS
  Filled 2022-07-25: qty 27

## 2022-07-25 MED ORDER — SODIUM CHLORIDE 0.9 % IV SOLN
45.0000 mg/m2 | Freq: Once | INTRAVENOUS | Status: AC
  Administered 2022-07-25: 90 mg via INTRAVENOUS
  Filled 2022-07-25: qty 15

## 2022-07-25 MED ORDER — SODIUM CHLORIDE 0.9 % IV SOLN
10.0000 mg | Freq: Once | INTRAVENOUS | Status: AC
  Administered 2022-07-25: 10 mg via INTRAVENOUS
  Filled 2022-07-25: qty 10

## 2022-07-25 MED ORDER — FAMOTIDINE IN NACL 20-0.9 MG/50ML-% IV SOLN
20.0000 mg | Freq: Once | INTRAVENOUS | Status: AC
  Administered 2022-07-25: 20 mg via INTRAVENOUS
  Filled 2022-07-25: qty 50

## 2022-07-25 NOTE — Progress Notes (Signed)
[facility] OFFICE PROGRESS NOTE  [name], MD [place] [address]  DIAGNOSIS: Stage IIb (T2 a, N1, M0) non-small cell lung cancer, squamous cell carcinoma presented with left upper lobe lung mass in addition to left hilar adenopathy diagnosed in June 2023.  The patient also has synchronous stage Ia (T1c, N0, M0) non-small cell lung cancer, squamous cell carcinoma presented with right upper lobe lung nodule diagnosed in June 2023. PD-L1 expression was 20%.  PRIOR THERAPY: None   CURRENT THERAPY: Concurrent chemoradiation with weekly carboplatin for AUC of 2 and paclitaxel 45 Mg/M2.  First dose 07/04/2022.  Status post post 4 cycles.  INTERVAL HISTORY: NATION CRADLE 61 y.o. male returns to the clinic today for a follow-up visit accompanied by his cousin. 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  He is currently undergoing speech therapy, although they reportedly mention there is not a lot of expected improvement that they are anticipating.  Patient lives with his cousin, [name].  The patient is feeling fairly well today without any concerning complaints. The patient is currently undergoing a course of concurrent chemoradiation.  His last day radiation is tentatively scheduled for 07/3022.  This would mean that his last chemotherapy should be around 08/15/22.  He is then scheduled to undergo SBRT to the right lung following this which is expected to be completed on 09/02/22.  The patient is tolerating it well without any adverse side effects.  The patient denies any recent fever, chills, or night sweats.  The patient lost some weight since last being seen.  The patient states he was previously drinking supplemental drinks but they ran out.  He is still eating but not eating the quantity that he used to.  He is expected to see a member the nutritionist team while in the infusion room today.  The patient is hypotensive today.  The patient does not take  any blood pressure medicine.  He is asymptomatic denies any lightheadedness or dizziness.  He states he is drinking fluid.  Denies any significant odynophagia or dysphagia from radiation.  Denies any hemoptysis, chest pain, or dyspnea on exertion.  The patient reports a stable dry cough.  Patient's cousin states it is about similar to when he was here last time.  They do not feel that he needs a chest x-ray to assess for infection.  Denies any nausea, vomiting, or diarrhea.  Will take a laxative if needed for constipation.  Denies any headache or visual changes.  Denies any rashes or skin changes.  The patient is being followed closely by member the nutritionist team.  He is here today for evaluation and repeat blood work before starting cycle #5.   MEDICAL HISTORY: Past Medical History:  Diagnosis Date   Asthma    COPD (chronic obstructive pulmonary disease) (HCC)    GERD (gastroesophageal reflux disease)    History of tracheostomy    03/09/22-04/11/22   HLD (hyperlipidemia)    Hypertension    PAD (peripheral artery disease) (HCC)    Seizures ([place]) 06/02/2022   Stroke ([place]) 02/2022    ALLERGIES:  has No Known Allergies.  MEDICATIONS:  Current Outpatient Medications  Medication Sig Dispense Refill   acetaminophen (TYLENOL) 325 MG tablet Take 1-2 tablets (325-650 mg total) by mouth every 4 (four) hours as needed for mild pain. (Patient not taking: Reported on 06/24/2022)     atorvastatin (LIPITOR) 40 MG tablet Take 1 tablet (40 mg total)  by mouth daily. 90 tablet 3   budesonide (PULMICORT) 0.5 MG/2ML nebulizer solution USE 2 ML(0.5 MG) VIA NEBULIZER TWICE DAILY 120 mL 11   clopidogrel (PLAVIX) 75 MG tablet Take 1 tablet (75 mg total) by mouth daily. Okay to restart this medication on 05/31/2022 90 tablet 3   folic acid (FOLVITE) 1 MG tablet Take 1 tablet (1 mg total) by mouth daily. 30 tablet 0   levETIRAcetam (KEPPRA) 500 MG tablet Take 1 tablet (500 mg total) by mouth 2 (two) times daily. 180  tablet 3   Multiple Vitamin (MULTIVITAMIN WITH MINERALS) TABS tablet Take 1 tablet by mouth daily. 30 tablet 0   nicotine (NICODERM CQ - DOSED IN MG/24 HOURS) 14 mg/24hr patch Place 1 patch (14 mg total) onto the skin daily. 28 patch 0   ondansetron (ZOFRAN) 4 MG tablet Take 4 mg by mouth every 8 (eight) hours as needed for nausea or vomiting. (Patient not taking: Reported on 07/18/2022)     pantoprazole (PROTONIX) 40 MG tablet Take 1 tablet (40 mg total) by mouth daily. 30 tablet 0   prochlorperazine (COMPAZINE) 10 MG tablet Take 1 tablet (10 mg total) by mouth every 6 (six) hours as needed for nausea or vomiting. (Patient not taking: Reported on 07/18/2022) 30 tablet 0   STIOLTO RESPIMAT 2.5-2.5 MCG/ACT AERS INHALE 2 PUFFS INTO THE LUNGS DAILY 4 g 5   thiamine 100 MG tablet Take 1 tablet (100 mg total) by mouth daily. (Patient not taking: Reported on 06/24/2022) 30 tablet 2   Current Facility-Administered Medications  Medication Dose Route Frequency Provider Last Rate Last Admin   0.9 %  sodium chloride infusion   Intravenous Continuous Lakayla Barrington L, PA-C        SURGICAL HISTORY:  Past Surgical History:  Procedure Laterality Date   BRONCHIAL BIOPSY  05/30/2022   Procedure: BRONCHIAL BIOPSIES;  Surgeon: Collene Gobble, MD;  Location: [facility] ENDOSCOPY;  Service: Pulmonary;;   BRONCHIAL BRUSHINGS  05/30/2022   Procedure: BRONCHIAL BRUSHINGS;  Surgeon: Collene Gobble, MD;  Location: [facility] ENDOSCOPY;  Service: Pulmonary;;   BRONCHIAL NEEDLE ASPIRATION BIOPSY  05/30/2022   Procedure: BRONCHIAL NEEDLE ASPIRATION BIOPSIES;  Surgeon: Collene Gobble, MD;  Location: [facility] ENDOSCOPY;  Service: Pulmonary;;   BRONCHIAL WASHINGS  05/30/2022   Procedure: BRONCHIAL WASHINGS;  Surgeon: Collene Gobble, MD;  Location: [facility] ENDOSCOPY;  Service: Pulmonary;;   ESOPHAGOGASTRODUODENOSCOPY (EGD) WITH PROPOFOL N/A 03/11/2022   Procedure: ESOPHAGOGASTRODUODENOSCOPY (EGD) WITH PROPOFOL;  Surgeon: Georganna Skeans, MD;   Location: [facility];  Service: General;  Laterality: N/A;   FIDUCIAL MARKER PLACEMENT  05/30/2022   Procedure: FIDUCIAL MARKER PLACEMENT;  Surgeon: Collene Gobble, MD;  Location: [facility] ENDOSCOPY;  Service: Pulmonary;;   IR ANGIO INTRA EXTRACRAN SEL COM CAROTID INNOMINATE UNI R MOD SED  03/02/2022   IR CT HEAD LTD  03/02/2022   IR PERCUTANEOUS ART THROMBECTOMY/INFUSION INTRACRANIAL INC DIAG ANGIO  03/02/2022   PEG PLACEMENT N/A 03/11/2022   Procedure: PERCUTANEOUS ENDOSCOPIC GASTROSTOMY (PEG) PLACEMENT;  Surgeon: Georganna Skeans, MD;  Location: [facility];  Service: General;  Laterality: N/A;   RADIOLOGY WITH ANESTHESIA N/A 03/02/2022   Procedure: IR WITH ANESTHESIA;  Surgeon: Luanne Bras, MD;  Location: [facility];  Service: Radiology;  Laterality: N/A;   VIDEO BRONCHOSCOPY WITH RADIAL ENDOBRONCHIAL ULTRASOUND  05/30/2022   Procedure: VIDEO BRONCHOSCOPY WITH RADIAL ENDOBRONCHIAL ULTRASOUND;  Surgeon: Collene Gobble, MD;  Location: [facility] ENDOSCOPY;  Service: Pulmonary;;    REVIEW OF SYSTEMS:  Review of Systems  Constitutional: Positive for weight loss and decreased appetite.  Negative for chills, fatigue, and fever.  HENT:   Negative for mouth sores, nosebleeds, sore throat and trouble swallowing.   Eyes: Negative for eye problems and icterus.  Respiratory: Positive for stable cough.  Negative for hemoptysis, shortness of breath and wheezing.   Cardiovascular: Negative for chest pain and leg swelling.  Gastrointestinal: Negative for abdominal pain, constipation (none at this time), diarrhea, nausea and vomiting.  Genitourinary: Negative for bladder incontinence, difficulty urinating, dysuria, frequency and hematuria.   Musculoskeletal: Negative for back pain, gait problem, neck pain and neck stiffness.  Skin: Negative for itching and rash.  Neurological: Positive for speech impairment from prior strokes.  Negative for dizziness, extremity weakness, gait problem, headaches, light-headedness and  seizures.  Hematological: Negative for adenopathy. Does not bruise/bleed easily.  Psychiatric/Behavioral: Negative for confusion, depression and sleep disturbance. The patient is not nervous/anxious.     PHYSICAL EXAMINATION:  Blood pressure (!) 80/59, pulse 94, temperature 98.4 F (36.9 C), temperature source Oral, resp. rate 15, height 6' (1.829 m), weight 166 lb 14.4 oz (75.7 kg).  ECOG PERFORMANCE STATUS: 2  Physical Exam  Constitutional: Oriented to person, place, and time and chronically ill-appearing male and in no distress.  HENT:  Head: Normocephalic and atraumatic.  Mouth/Throat: Patient is able to understand questions but has difficulty verbalizing.  Oropharynx is clear and moist. No oropharyngeal exudate.  Eyes: Conjunctivae are normal. Right eye exhibits no discharge. Left eye exhibits no discharge. No scleral icterus.  Neck: Normal range of motion. Neck supple.  Cardiovascular: Normal rate, regular rhythm, normal heart sounds and intact distal pulses.   Pulmonary/Chest: Effort normal. Breath sounds diminished bilaterally. No respiratory distress. No wheezes. No rales.  Abdominal: Soft. Bowel sounds are normal. Exhibits no distension and no mass. There is no tenderness.  Musculoskeletal: Normal range of motion. Exhibits no edema.  Lymphadenopathy:    No cervical adenopathy.  Neurological: Alert and oriented to person, place, and time.  Exhibits muscle wasting.  Examined in the wheelchair.   Skin: Skin is warm and dry. No rash noted. Not diaphoretic. No erythema. No pallor.  Psychiatric: Mood, memory and judgment normal.  Vitals reviewed.  LABORATORY DATA: Lab Results  Component Value Date   WBC 4.1 08/01/2022   HGB 12.8 (L) 08/01/2022   HCT 36.8 (L) 08/01/2022   MCV 87.2 08/01/2022   PLT 111 (L) 08/01/2022      Chemistry      Component Value Date/Time   NA 136 08/01/2022 1049   K 4.3 08/01/2022 1049   CL 103 08/01/2022 1049   CO2 27 08/01/2022 1049   BUN 20  08/01/2022 1049   CREATININE 0.79 08/01/2022 1049      Component Value Date/Time   CALCIUM 9.3 08/01/2022 1049   ALKPHOS 85 08/01/2022 1049   AST 15 08/01/2022 1049   ALT 17 08/01/2022 1049   BILITOT 0.9 08/01/2022 1049       RADIOGRAPHIC STUDIES:  No results found.   ASSESSMENT/PLAN:  This is a very pleasant 61 year old Caucasian male with a history of stroke and speech impairment.  He was recently diagnosed with synchronous tumor presenting with a stage IIb (T2 a, N1, M0) non-small cell lung cancer, squamous cell carcinoma in the left upper lobe in addition to left hilar adenopathy.  Diagnosed in June 2023.  He also has a synchronous stage Ia (T1c, N0, M0) non-small cell lung cancer, squamous cell carcinoma in the  right upper lobe.  This was also diagnosed in June 2023.  His PD-L1 expression is 20%.  The patient is not a good surgical candidate for resection.  Dr. Julien Nordmann previously discussed that synchronous lung cancer involving the left upper lobe and right upper lobe could be considered stage IV if the cancer in the left lung metastasized to the right lung.  Dr. Julien Nordmann recommends treating with the aggressive approach for his oligometastatic disease with a course of concurrent chemoradiation to the left lung in addition to SBRT to the right upper lobe lung nodule.  He is currently undergoing weekly concurrent chemoradiation with carboplatin AUC of 2 and paclitaxel 45 mg per metered square.  The patient's last day radiation to the left lung is 08/17/2022.  He is on expected to undergo SBRT to the right lung and the last day of that treatment is 09/02/2022   Labs were reviewed.  Recommend that he proceed with cycle #5 today scheduled.  We will see him back for follow-up visit in 2 weeks for evaluation and repeat blood work before starting cycle #7.  Encouraged to drink plenty of fluids and eat small frequent meals.  He is expected to see a member the nutritionist team while in the  infusion room.  I also strongly encouraged him to drink supplemental drinks such as boost or Ensure. The patient is hypotensive today.  He is asymptomatic.  We will arrange for him to receive 1 L of fluid over 2 hours today.  I reviewed his blood pressure with Dr. Julien Nordmann.  The patient is okay to treat per Dr. Julien Nordmann.   Advised the patient and his cousin to please call the clinic and to monitor his blood pressure closely at home.  We can arrange for IV fluids if needed.  The patient was advised to call immediately if he has any concerning symptoms in the interval. The patient voices understanding of current disease status and treatment options and is in agreement with the current care plan. All questions were answered. The patient knows to call the clinic with any problems, questions or concerns. We can certainly see the patient much sooner if necessary         No orders of the defined types were placed in this encounter.    The total time spent in the appointment was 20-29 minutes.   Ruthene Methvin L Zyon Grout, PA-C 08/01/22

## 2022-07-25 NOTE — Patient Instructions (Signed)
[facility] ONCOLOG[facility]  Discharge Instructions: Thank you for choosing [facility] to provide your oncology and hematology care.   If you have a lab appointment with the [facility], please go directly to the [facility] and check in at the registration area.   Wear comfortable clothing and clothing appropriate for easy access to any Portacath or PICC line.   We strive to give you quality time with your provider. You may need to reschedule your appointment if you arrive late (15 or more minutes).  Arriving late affects you and other patients whose appointments are after yours.  Also, if you miss three or more appointments without notifying the office, you may be dismissed from the clinic at the provider's discretion.      For prescription refill requests, have your pharmacy contact our office and allow 72 hours for refills to be completed.    Today you received the following chemotherapy and/or immunotherapy agents: Paclitaxel & Carboplatin      To help prevent nausea and vomiting after your treatment, we encourage you to take your nausea medication as directed.  BELOW ARE SYMPTOMS THAT SHOULD BE REPORTED IMMEDIATELY: *FEVER GREATER THAN 100.4 F (38 C) OR HIGHER *CHILLS OR SWEATING *NAUSEA AND VOMITING THAT IS NOT CONTROLLED WITH YOUR NAUSEA MEDICATION *UNUSUAL SHORTNESS OF BREATH *UNUSUAL BRUISING OR BLEEDING *URINARY PROBLEMS (pain or burning when urinating, or frequent urination) *BOWEL PROBLEMS (unusual diarrhea, constipation, pain near the anus) TENDERNESS IN MOUTH AND THROAT WITH OR WITHOUT PRESENCE OF ULCERS (sore throat, sores in mouth, or a toothache) UNUSUAL RASH, SWELLING OR PAIN  UNUSUAL VAGINAL DISCHARGE OR ITCHING   Items with * indicate a potential emergency and should be followed up as soon as possible or go to the Emergency Department if any problems should occur.  Please show the CHEMOTHERAPY ALERT CARD or IMMUNOTHERAPY ALERT CARD  at check-in to the Emergency Department and triage nurse.  Should you have questions after your visit or need to cancel or reschedule your appointment, please contact [facility]  Dept: [phone]  and follow the prompts.  Office hours are 8:00 a.m. to 4:30 p.m. Monday - Friday. Please note that voicemails left after 4:00 p.m. may not be returned until the following business day.  We are closed weekends and major holidays. You have access to a nurse at all times for urgent questions. Please call the main number to the clinic Dept: [phone] and follow the prompts.   For any non-urgent questions, you may also contact your provider using MyChart. We now offer e-Visits for anyone 76 and older to request care online for non-urgent symptoms. For details visit [email or web address].   Also download the MyChart app! Go to the app store, search "MyChart", open the app, select [facility], and log in with your MyChart username and password.  Masks are optional in the cancer centers. If you would like for your care team to wear a mask while they are taking care of you, please let them know. For doctor visits, patients may have with them one support Katryna Tschirhart who is at least 61 years old. At this time, visitors are not allowed in the infusion area.

## 2022-07-26 ENCOUNTER — Other Ambulatory Visit: Payer: Self-pay

## 2022-07-26 ENCOUNTER — Ambulatory Visit
Admission: RE | Admit: 2022-07-26 | Discharge: 2022-07-26 | Disposition: A | Discharge status: Home / Self Care | Payer: Medicaid Other | Source: Ambulatory Visit | Attending: Radiation Oncology | Admitting: Radiation Oncology

## 2022-07-26 DIAGNOSIS — Z510 Encounter for antineoplastic radiation therapy: Secondary | ICD-10-CM | POA: Diagnosis not present

## 2022-07-26 LAB — RAD ONC ARIA SESSION SUMMARY
Course Elapsed Days: 22
Plan Fractions Treated to Date: 16
Plan Prescribed Dose Per Fraction: 2 Gy
Plan Total Fractions Prescribed: 30
Plan Total Prescribed Dose: 60 Gy
Reference Point Dosage Given to Date: 30.9619 Gy
Reference Point Session Dosage Given: 2 Gy
Session Number: 16

## 2022-07-27 ENCOUNTER — Other Ambulatory Visit: Payer: Self-pay

## 2022-07-27 ENCOUNTER — Ambulatory Visit
Admission: RE | Admit: 2022-07-27 | Discharge: 2022-07-27 | Disposition: A | Discharge status: Home / Self Care | Payer: Medicaid Other | Source: Ambulatory Visit | Attending: Radiation Oncology | Admitting: Radiation Oncology

## 2022-07-27 DIAGNOSIS — Z510 Encounter for antineoplastic radiation therapy: Secondary | ICD-10-CM | POA: Diagnosis not present

## 2022-07-27 LAB — RAD ONC ARIA SESSION SUMMARY
Course Elapsed Days: 23
Plan Fractions Treated to Date: 17
Plan Prescribed Dose Per Fraction: 2 Gy
Plan Total Fractions Prescribed: 30
Plan Total Prescribed Dose: 60 Gy
Reference Point Dosage Given to Date: 32.9619 Gy
Reference Point Session Dosage Given: 2 Gy
Session Number: 17

## 2022-07-28 ENCOUNTER — Ambulatory Visit
Admission: RE | Admit: 2022-07-28 | Discharge: 2022-07-28 | Disposition: A | Discharge status: Home / Self Care | Payer: Medicaid Other | Source: Ambulatory Visit | Attending: Radiation Oncology | Admitting: Radiation Oncology

## 2022-07-28 ENCOUNTER — Other Ambulatory Visit: Payer: Self-pay

## 2022-07-28 DIAGNOSIS — Z510 Encounter for antineoplastic radiation therapy: Secondary | ICD-10-CM | POA: Diagnosis not present

## 2022-07-28 LAB — RAD ONC ARIA SESSION SUMMARY
Course Elapsed Days: 24
Plan Fractions Treated to Date: 18
Plan Prescribed Dose Per Fraction: 2 Gy
Plan Total Fractions Prescribed: 30
Plan Total Prescribed Dose: 60 Gy
Reference Point Dosage Given to Date: 34.9619 Gy
Reference Point Session Dosage Given: 2 Gy
Session Number: 18

## 2022-07-29 ENCOUNTER — Ambulatory Visit: Payer: Medicaid Other

## 2022-07-29 ENCOUNTER — Ambulatory Visit: Payer: Medicaid Other | Admitting: Physical Medicine & Rehabilitation

## 2022-07-29 ENCOUNTER — Ambulatory Visit
Admission: RE | Admit: 2022-07-29 | Discharge: 2022-07-29 | Disposition: A | Discharge status: Home / Self Care | Payer: Medicaid Other | Source: Ambulatory Visit | Attending: Radiation Oncology | Admitting: Radiation Oncology

## 2022-07-29 ENCOUNTER — Other Ambulatory Visit: Payer: Self-pay

## 2022-07-29 DIAGNOSIS — Z510 Encounter for antineoplastic radiation therapy: Secondary | ICD-10-CM | POA: Diagnosis not present

## 2022-07-29 LAB — RAD ONC ARIA SESSION SUMMARY
Course Elapsed Days: 25
Plan Fractions Treated to Date: 19
Plan Prescribed Dose Per Fraction: 2 Gy
Plan Total Fractions Prescribed: 30
Plan Total Prescribed Dose: 60 Gy
Reference Point Dosage Given to Date: 36.9619 Gy
Reference Point Session Dosage Given: 2 Gy
Session Number: 19

## 2022-07-29 MED FILL — Dexamethasone Sodium Phosphate Inj 100 MG/10ML: INTRAMUSCULAR | Qty: 1 | Status: AC

## 2022-08-01 ENCOUNTER — Inpatient Hospital Stay (HOSPITAL_BASED_OUTPATIENT_CLINIC_OR_DEPARTMENT_OTHER): Payer: Medicaid Other | Admitting: Physician Assistant

## 2022-08-01 ENCOUNTER — Other Ambulatory Visit: Payer: Self-pay

## 2022-08-01 ENCOUNTER — Inpatient Hospital Stay: Payer: Medicaid Other

## 2022-08-01 ENCOUNTER — Ambulatory Visit
Admission: RE | Admit: 2022-08-01 | Discharge: 2022-08-01 | Disposition: A | Discharge status: Home / Self Care | Payer: Medicaid Other | Source: Ambulatory Visit | Attending: Radiation Oncology | Admitting: Radiation Oncology

## 2022-08-01 VITALS — BP 80/59 | HR 94 | Temp 98.4°F | Resp 15 | Ht 72.0 in | Wt 166.9 lb

## 2022-08-01 VITALS — BP 116/70 | HR 66 | Resp 18

## 2022-08-01 DIAGNOSIS — I959 Hypotension, unspecified: Secondary | ICD-10-CM | POA: Diagnosis not present

## 2022-08-01 DIAGNOSIS — Z510 Encounter for antineoplastic radiation therapy: Secondary | ICD-10-CM | POA: Diagnosis not present

## 2022-08-01 DIAGNOSIS — C3492 Malignant neoplasm of unspecified part of left bronchus or lung: Secondary | ICD-10-CM

## 2022-08-01 DIAGNOSIS — Z5111 Encounter for antineoplastic chemotherapy: Secondary | ICD-10-CM | POA: Diagnosis not present

## 2022-08-01 LAB — CMP (CANCER CENTER ONLY)
ALT: 17 U/L (ref 0–44)
AST: 15 U/L (ref 15–41)
Albumin: 4.1 g/dL (ref 3.5–5.0)
Alkaline Phosphatase: 85 U/L (ref 38–126)
Anion gap: 6 (ref 5–15)
BUN: 20 mg/dL (ref 8–23)
CO2: 27 mmol/L (ref 22–32)
Calcium: 9.3 mg/dL (ref 8.9–10.3)
Chloride: 103 mmol/L (ref 98–111)
Creatinine: 0.79 mg/dL (ref 0.61–1.24)
GFR, Estimated: >60 mL/min (ref >60)
Glucose, Bld: 115 mg/dL — ABNORMAL HIGH (ref 70–99)
Potassium: 4.3 mmol/L (ref 3.5–5.1)
Sodium: 136 mmol/L (ref 135–145)
Total Bilirubin: 0.9 mg/dL (ref 0.3–1.2)
Total Protein: 6.9 g/dL (ref 6.5–8.1)

## 2022-08-01 LAB — RAD ONC ARIA SESSION SUMMARY
Course Elapsed Days: 28
Plan Fractions Treated to Date: 20
Plan Prescribed Dose Per Fraction: 2 Gy
Plan Total Fractions Prescribed: 30
Plan Total Prescribed Dose: 60 Gy
Reference Point Dosage Given to Date: 38.9619 Gy
Reference Point Session Dosage Given: 2 Gy
Session Number: 20

## 2022-08-01 LAB — CBC WITH DIFFERENTIAL (CANCER CENTER ONLY)
Abs Immature Granulocytes: 0.02 10*3/uL (ref 0.00–0.07)
Basophils Absolute: 0 10*3/uL (ref 0.0–0.1)
Basophils Relative: 1 %
Eosinophils Absolute: 0 10*3/uL (ref 0.0–0.5)
Eosinophils Relative: 1 %
HCT: 36.8 % — ABNORMAL LOW (ref 39.0–52.0)
Hemoglobin: 12.8 g/dL — ABNORMAL LOW (ref 13.0–17.0)
Immature Granulocytes: 1 %
Lymphocytes Relative: 14 %
Lymphs Abs: 0.6 10*3/uL — ABNORMAL LOW (ref 0.7–4.0)
MCH: 30.3 pg (ref 26.0–34.0)
MCHC: 34.8 g/dL (ref 30.0–36.0)
MCV: 87.2 fL (ref 80.0–100.0)
Monocytes Absolute: 0.3 10*3/uL (ref 0.1–1.0)
Monocytes Relative: 6 %
Neutro Abs: 3.2 10*3/uL (ref 1.7–7.7)
Neutrophils Relative %: 77 %
Platelet Count: 111 10*3/uL — ABNORMAL LOW (ref 150–400)
RBC: 4.22 MIL/uL (ref 4.22–5.81)
RDW: 13.1 % (ref 11.5–15.5)
WBC Count: 4.1 10*3/uL (ref 4.0–10.5)
nRBC: 0 % (ref 0.0–0.2)

## 2022-08-01 MED ORDER — FAMOTIDINE IN NACL 20-0.9 MG/50ML-% IV SOLN
20.0000 mg | Freq: Once | INTRAVENOUS | Status: AC
  Administered 2022-08-01: 20 mg via INTRAVENOUS

## 2022-08-01 MED ORDER — SODIUM CHLORIDE 0.9 % IV SOLN: Freq: Once | INTRAVENOUS | Status: AC

## 2022-08-01 MED ORDER — SODIUM CHLORIDE 0.9 % IV SOLN
267.2000 mg | Freq: Once | INTRAVENOUS | Status: AC
  Administered 2022-08-01: 270 mg via INTRAVENOUS
  Filled 2022-08-01: qty 27

## 2022-08-01 MED ORDER — PALONOSETRON HCL INJECTION 0.25 MG/5ML
0.2500 mg | Freq: Once | INTRAVENOUS | Status: AC
  Administered 2022-08-01: 0.25 mg via INTRAVENOUS

## 2022-08-01 MED ORDER — DIPHENHYDRAMINE HCL 50 MG/ML IJ SOLN
50.0000 mg | Freq: Once | INTRAMUSCULAR | Status: AC
  Administered 2022-08-01: 50 mg via INTRAVENOUS

## 2022-08-01 MED ORDER — SODIUM CHLORIDE 0.9 % IV SOLN
10.0000 mg | Freq: Once | INTRAVENOUS | Status: AC
  Administered 2022-08-01: 10 mg via INTRAVENOUS
  Filled 2022-08-01: qty 10

## 2022-08-01 MED ORDER — SODIUM CHLORIDE 0.9 % IV SOLN
45.0000 mg/m2 | Freq: Once | INTRAVENOUS | Status: AC
  Administered 2022-08-01: 90 mg via INTRAVENOUS
  Filled 2022-08-01: qty 15

## 2022-08-01 MED ORDER — SODIUM CHLORIDE 0.9 % IV SOLN: INTRAVENOUS | Status: DC

## 2022-08-01 NOTE — Progress Notes (Signed)
Per PA, ok to treat with low BP, fluids added to [place] for today.

## 2022-08-01 NOTE — Patient Instructions (Signed)
[facility] ONCOLOG[facility]  Discharge Instructions: Thank you for choosing [facility] to provide your oncology and hematology care.   If you have a lab appointment with the [facility], please go directly to the [facility] and check in at the registration area.   Wear comfortable clothing and clothing appropriate for easy access to any Portacath or PICC line.   We strive to give you quality time with your provider. You may need to reschedule your appointment if you arrive late (15 or more minutes).  Arriving late affects you and other patients whose appointments are after yours.  Also, if you miss three or more appointments without notifying the office, you may be dismissed from the clinic at the provider's discretion.      For prescription refill requests, have your pharmacy contact our office and allow 72 hours for refills to be completed.    Today you received the following chemotherapy and/or immunotherapy agents: Paclitaxel, Carboplatin.      To help prevent nausea and vomiting after your treatment, we encourage you to take your nausea medication as directed.  BELOW ARE SYMPTOMS THAT SHOULD BE REPORTED IMMEDIATELY: *FEVER GREATER THAN 100.4 F (38 C) OR HIGHER *CHILLS OR SWEATING *NAUSEA AND VOMITING THAT IS NOT CONTROLLED WITH YOUR NAUSEA MEDICATION *UNUSUAL SHORTNESS OF BREATH *UNUSUAL BRUISING OR BLEEDING *URINARY PROBLEMS (pain or burning when urinating, or frequent urination) *BOWEL PROBLEMS (unusual diarrhea, constipation, pain near the anus) TENDERNESS IN MOUTH AND THROAT WITH OR WITHOUT PRESENCE OF ULCERS (sore throat, sores in mouth, or a toothache) UNUSUAL RASH, SWELLING OR PAIN  UNUSUAL VAGINAL DISCHARGE OR ITCHING   Items with * indicate a potential emergency and should be followed up as soon as possible or go to the Emergency Department if any problems should occur.  Please show the CHEMOTHERAPY ALERT CARD or IMMUNOTHERAPY ALERT CARD  at check-in to the Emergency Department and triage nurse.  Should you have questions after your visit or need to cancel or reschedule your appointment, please contact [facility]  Dept: [phone]  and follow the prompts.  Office hours are 8:00 a.m. to 4:30 p.m. Monday - Friday. Please note that voicemails left after 4:00 p.m. may not be returned until the following business day.  We are closed weekends and major holidays. You have access to a nurse at all times for urgent questions. Please call the main number to the clinic Dept: [phone] and follow the prompts.   For any non-urgent questions, you may also contact your provider using MyChart. We now offer e-Visits for anyone 22 and older to request care online for non-urgent symptoms. For details visit [email or web address].   Also download the MyChart app! Go to the app store, search "MyChart", open the app, select [facility], and log in with your MyChart username and password.  Masks are optional in the cancer centers. If you would like for your care team to wear a mask while they are taking care of you, please let them know. You may have one support person who is at least 62 years old accompany you for your appointments.

## 2022-08-01 NOTE — Progress Notes (Signed)
Nutrition Assessment   Reason for Assessment:  Patient identified on Malnutrition Screening report for weight loss.     ASSESSMENT: 61 year old male with non small cell lung cancer.  Past medical history of COPD, GERD, HLD, HTN, stroke, PAD, seizure.  Patient receiving chemotherapy and radiation.   Patient with speech impairment.  Called and spoke with cousin, [name].  [name] lives with patient.  [name] reports decrease in patients appetite and not eating the same volume as before.  Says that he is not hungry and feels up more quickly.  Did have recent issues with constipation but resolved with miralax.  Mainly is eating lunch and dinner and snack.  Does not really eat much breakfast.  Cousin will cook at times but then goes out to eat as well.  Has drank ensure/boost in the past but ran out recently.  Cousin planning to get more.  Does not complain of being nauseated.     Medications: folic acid, zofran, compazine, MVI, protonix, thiamine   Labs: glucose 115   Anthropometrics:   Height: 72 inches Weight: 166 lb 14.4 oz  170 lb 06/02/22 BMI: 23  2% weight loss in the last 2 months    NUTRITION DIAGNOSIS: Inadequate oral intake related to cancer and cancer related to treatment side effects as evidenced by 2% weight loss and poor po intake   INTERVENTION:  Encouraged 350 calorie shake or higher Discussed ways to add calories and protein in diet. Will mail High Calorie, High Protein diet information to cousin.   Contact information will be included   MONITORING, EVALUATION, GOAL: weight trends, intake   Next Visit: Monday, August 28 phone call with cousin Mingo Amber B. Zenia Resides, [place], [place] Registered Dietitian [phone]

## 2022-08-01 NOTE — Addendum Note (Signed)
Addended by: Claretha Cooper on: 08/01/2022 01:58 PM   Modules accepted: Orders

## 2022-08-02 ENCOUNTER — Other Ambulatory Visit: Payer: Self-pay

## 2022-08-02 ENCOUNTER — Ambulatory Visit
Admission: RE | Admit: 2022-08-02 | Discharge: 2022-08-02 | Disposition: A | Discharge status: Home / Self Care | Payer: Medicaid Other | Source: Ambulatory Visit | Attending: Radiation Oncology | Admitting: Radiation Oncology

## 2022-08-02 DIAGNOSIS — Z510 Encounter for antineoplastic radiation therapy: Secondary | ICD-10-CM | POA: Diagnosis not present

## 2022-08-02 LAB — RAD ONC ARIA SESSION SUMMARY
Course Elapsed Days: 29
Plan Fractions Treated to Date: 21
Plan Prescribed Dose Per Fraction: 2 Gy
Plan Total Fractions Prescribed: 30
Plan Total Prescribed Dose: 60 Gy
Reference Point Dosage Given to Date: 40.9619 Gy
Reference Point Session Dosage Given: 2 Gy
Session Number: 21

## 2022-08-03 ENCOUNTER — Other Ambulatory Visit: Payer: Self-pay

## 2022-08-03 ENCOUNTER — Ambulatory Visit
Admission: RE | Admit: 2022-08-03 | Discharge: 2022-08-03 | Disposition: A | Discharge status: Home / Self Care | Payer: Medicaid Other | Source: Ambulatory Visit | Attending: Radiation Oncology | Admitting: Radiation Oncology

## 2022-08-03 DIAGNOSIS — Z510 Encounter for antineoplastic radiation therapy: Secondary | ICD-10-CM | POA: Diagnosis not present

## 2022-08-03 LAB — RAD ONC ARIA SESSION SUMMARY
Course Elapsed Days: 30
Plan Fractions Treated to Date: 22
Plan Prescribed Dose Per Fraction: 2 Gy
Plan Total Fractions Prescribed: 30
Plan Total Prescribed Dose: 60 Gy
Reference Point Dosage Given to Date: 42.9619 Gy
Reference Point Session Dosage Given: 2 Gy
Session Number: 22

## 2022-08-04 ENCOUNTER — Ambulatory Visit
Admission: RE | Admit: 2022-08-04 | Discharge: 2022-08-04 | Disposition: A | Discharge status: Home / Self Care | Payer: Medicaid Other | Source: Ambulatory Visit | Attending: Radiation Oncology | Admitting: Radiation Oncology

## 2022-08-04 ENCOUNTER — Other Ambulatory Visit: Payer: Self-pay

## 2022-08-04 DIAGNOSIS — Z510 Encounter for antineoplastic radiation therapy: Secondary | ICD-10-CM | POA: Diagnosis not present

## 2022-08-04 LAB — RAD ONC ARIA SESSION SUMMARY
Course Elapsed Days: 31
Plan Fractions Treated to Date: 23
Plan Prescribed Dose Per Fraction: 2 Gy
Plan Total Fractions Prescribed: 30
Plan Total Prescribed Dose: 60 Gy
Reference Point Dosage Given to Date: 44.9619 Gy
Reference Point Session Dosage Given: 2 Gy
Session Number: 23

## 2022-08-05 ENCOUNTER — Ambulatory Visit
Admission: RE | Admit: 2022-08-05 | Discharge: 2022-08-05 | Disposition: A | Discharge status: Home / Self Care | Payer: Medicaid Other | Source: Ambulatory Visit | Attending: Radiation Oncology | Admitting: Radiation Oncology

## 2022-08-05 ENCOUNTER — Other Ambulatory Visit: Payer: Self-pay

## 2022-08-05 DIAGNOSIS — Z510 Encounter for antineoplastic radiation therapy: Secondary | ICD-10-CM | POA: Diagnosis not present

## 2022-08-05 LAB — RAD ONC ARIA SESSION SUMMARY
Course Elapsed Days: 32
Plan Fractions Treated to Date: 24
Plan Prescribed Dose Per Fraction: 2 Gy
Plan Total Fractions Prescribed: 30
Plan Total Prescribed Dose: 60 Gy
Reference Point Dosage Given to Date: 46.9619 Gy
Reference Point Session Dosage Given: 2 Gy
Session Number: 24

## 2022-08-05 MED FILL — Dexamethasone Sodium Phosphate Inj 100 MG/10ML: INTRAMUSCULAR | Qty: 1 | Status: AC

## 2022-08-07 ENCOUNTER — Other Ambulatory Visit: Payer: Self-pay

## 2022-08-07 ENCOUNTER — Emergency Department (HOSPITAL_COMMUNITY): Payer: Medicaid Other

## 2022-08-07 ENCOUNTER — Emergency Department (HOSPITAL_COMMUNITY)
Admission: EM | Admit: 2022-08-07 | Discharge: 2022-08-07 | Disposition: A | Discharge status: Home / Self Care | Payer: Medicaid Other | Attending: Emergency Medicine | Admitting: Emergency Medicine

## 2022-08-07 ENCOUNTER — Encounter (HOSPITAL_COMMUNITY): Payer: Self-pay

## 2022-08-07 DIAGNOSIS — Z85118 Personal history of other malignant neoplasm of bronchus and lung: Secondary | ICD-10-CM | POA: Diagnosis not present

## 2022-08-07 DIAGNOSIS — Z87891 Personal history of nicotine dependence: Secondary | ICD-10-CM | POA: Insufficient documentation

## 2022-08-07 DIAGNOSIS — R0602 Shortness of breath: Secondary | ICD-10-CM | POA: Insufficient documentation

## 2022-08-07 DIAGNOSIS — J449 Chronic obstructive pulmonary disease, unspecified: Secondary | ICD-10-CM | POA: Diagnosis not present

## 2022-08-07 DIAGNOSIS — Z20822 Contact with and (suspected) exposure to covid-19: Secondary | ICD-10-CM | POA: Diagnosis not present

## 2022-08-07 DIAGNOSIS — I10 Essential (primary) hypertension: Secondary | ICD-10-CM | POA: Diagnosis not present

## 2022-08-07 DIAGNOSIS — J45909 Unspecified asthma, uncomplicated: Secondary | ICD-10-CM | POA: Diagnosis not present

## 2022-08-07 LAB — BASIC METABOLIC PANEL
Anion gap: 7 (ref 5–15)
BUN: 18 mg/dL (ref 8–23)
CO2: 26 mmol/L (ref 22–32)
Calcium: 8.4 mg/dL — ABNORMAL LOW (ref 8.9–10.3)
Chloride: 103 mmol/L (ref 98–111)
Creatinine, Ser: 0.9 mg/dL (ref 0.61–1.24)
GFR, Estimated: >60 mL/min (ref >60)
Glucose, Bld: 120 mg/dL — ABNORMAL HIGH (ref 70–99)
Potassium: 3.7 mmol/L (ref 3.5–5.1)
Sodium: 136 mmol/L (ref 135–145)

## 2022-08-07 LAB — CBC
HCT: 30.5 % — ABNORMAL LOW (ref 39.0–52.0)
Hemoglobin: 10.2 g/dL — ABNORMAL LOW (ref 13.0–17.0)
MCH: 30.3 pg (ref 26.0–34.0)
MCHC: 33.4 g/dL (ref 30.0–36.0)
MCV: 90.5 fL (ref 80.0–100.0)
Platelets: 66 10*3/uL — ABNORMAL LOW (ref 150–400)
RBC: 3.37 MIL/uL — ABNORMAL LOW (ref 4.22–5.81)
RDW: 13.5 % (ref 11.5–15.5)
WBC: 6.1 10*3/uL (ref 4.0–10.5)
nRBC: 0 % (ref 0.0–0.2)

## 2022-08-07 LAB — TROPONIN I (HIGH SENSITIVITY)
Troponin I (High Sensitivity): 5 ng/L (ref <18)
Troponin I (High Sensitivity): 5 ng/L (ref <18)

## 2022-08-07 LAB — D-DIMER, QUANTITATIVE: D-Dimer, Quant: 1.9 ug/mL-FEU — ABNORMAL HIGH (ref 0.00–0.50)

## 2022-08-07 LAB — SARS CORONAVIRUS 2 BY RT PCR: SARS Coronavirus 2 by RT PCR: NEGATIVE

## 2022-08-07 MED ORDER — SODIUM CHLORIDE (PF) 0.9 % IJ SOLN
INTRAMUSCULAR | Status: AC
  Filled 2022-08-07: qty 50

## 2022-08-07 MED ORDER — IOHEXOL 350 MG/ML SOLN
75.0000 mL | Freq: Once | INTRAVENOUS | Status: AC | PRN
  Administered 2022-08-07: 75 mL via INTRAVENOUS

## 2022-08-07 MED ORDER — SODIUM CHLORIDE 0.9 % IV BOLUS
1000.0000 mL | Freq: Once | INTRAVENOUS | Status: AC
  Administered 2022-08-07: 1000 mL via INTRAVENOUS

## 2022-08-07 NOTE — ED Triage Notes (Signed)
BIBA from home for [facility] for last 3 days and cough, has hx of hx lung cancer, very hard to understand when talking, Rt side weakness from previous stroke

## 2022-08-07 NOTE — ED Notes (Signed)
Called patient's cousin who is on his way to pick up patient.

## 2022-08-07 NOTE — ED Provider Notes (Signed)
[facility] Emergency Department Provider Note MRN:  [id number]  Arrival date & time: 08/07/22     Chief Complaint   Shortness of Breath   History of Present Illness   Todd Estrada is a 61 y.o. year-old male with a history of COPD, stroke, lung cancer presenting to the ED with chief complaint of shortness of breath.  Shortness of breath worsening over the past few days.  Denies chest pain, no fever, no cough, no leg pain or swelling, no abdominal pain.  Review of Systems  I was unable to obtain a full/accurate HPI, PMH, or ROS due to the patient's slurred speech from prior stroke.  Patient's Health History    Past Medical History:  Diagnosis Date   Asthma    COPD (chronic obstructive pulmonary disease) (HCC)    GERD (gastroesophageal reflux disease)    History of tracheostomy    03/09/22-04/11/22   HLD (hyperlipidemia)    Hypertension    PAD (peripheral artery disease) (HCC)    Seizures ([place]) 06/02/2022   Stroke ([place]) 02/2022    Past Surgical History:  Procedure Laterality Date   BRONCHIAL BIOPSY  05/30/2022   Procedure: BRONCHIAL BIOPSIES;  Surgeon: Collene Gobble, MD;  Location: [place];  Service: Pulmonary;;   BRONCHIAL BRUSHINGS  05/30/2022   Procedure: BRONCHIAL BRUSHINGS;  Surgeon: Collene Gobble, MD;  Location: [facility] ENDOSCOPY;  Service: Pulmonary;;   BRONCHIAL NEEDLE ASPIRATION BIOPSY  05/30/2022   Procedure: BRONCHIAL NEEDLE ASPIRATION BIOPSIES;  Surgeon: Collene Gobble, MD;  Location: [facility] ENDOSCOPY;  Service: Pulmonary;;   BRONCHIAL WASHINGS  05/30/2022   Procedure: BRONCHIAL WASHINGS;  Surgeon: Collene Gobble, MD;  Location: [facility] ENDOSCOPY;  Service: Pulmonary;;   ESOPHAGOGASTRODUODENOSCOPY (EGD) WITH PROPOFOL N/A 03/11/2022   Procedure: ESOPHAGOGASTRODUODENOSCOPY (EGD) WITH PROPOFOL;  Surgeon: Georganna Skeans, MD;  Location: [place];  Service: General;  Laterality: N/A;   FIDUCIAL MARKER PLACEMENT  05/30/2022   Procedure: FIDUCIAL  MARKER PLACEMENT;  Surgeon: Collene Gobble, MD;  Location: [facility] ENDOSCOPY;  Service: Pulmonary;;   IR ANGIO INTRA EXTRACRAN SEL COM CAROTID INNOMINATE UNI R MOD SED  03/02/2022   IR CT HEAD LTD  03/02/2022   IR PERCUTANEOUS ART THROMBECTOMY/INFUSION INTRACRANIAL INC DIAG ANGIO  03/02/2022   PEG PLACEMENT N/A 03/11/2022   Procedure: PERCUTANEOUS ENDOSCOPIC GASTROSTOMY (PEG) PLACEMENT;  Surgeon: Georganna Skeans, MD;  Location: [place];  Service: General;  Laterality: N/A;   RADIOLOGY WITH ANESTHESIA N/A 03/02/2022   Procedure: IR WITH ANESTHESIA;  Surgeon: Luanne Bras, MD;  Location: [place];  Service: Radiology;  Laterality: N/A;   VIDEO BRONCHOSCOPY WITH RADIAL ENDOBRONCHIAL ULTRASOUND  05/30/2022   Procedure: VIDEO BRONCHOSCOPY WITH RADIAL ENDOBRONCHIAL ULTRASOUND;  Surgeon: Collene Gobble, MD;  Location: [facility] ENDOSCOPY;  Service: Pulmonary;;    Family History  Problem Relation Age of Onset   Throat cancer Mother    Liver cancer Father    Kidney failure Sister    Cancer - Lung Paternal Uncle     Social History   Socioeconomic History   Marital status: Unknown    Spouse name: Not on file   Number of children: Not on file   Years of education: Not on file   Highest education level: Not on file  Occupational History   Not on file  Tobacco Use   Smoking status: Former    Types: Cigarettes    Quit date: 05/02/2022    Years since quitting: 0.2    Passive exposure: Never  Smokeless tobacco: Never  Vaping Use   Vaping Use: Never used  Substance and Sexual Activity   Alcohol use: Not Currently   Drug use: No   Sexual activity: Not on file  Other Topics Concern   Not on file  Social History Narrative   Not on file   Social Determinants of Health   Financial Resource Strain: Not on file  Food Insecurity: Not on file  Transportation Needs: Not on file  Physical Activity: Not on file  Stress: Not on file  Social Connections: Not on file  Intimate Partner Violence: Not on  file     Physical Exam   Vitals:   08/07/22 0630 08/07/22 0700  BP: (!) 89/61 (!) 87/55  Pulse: 84 83  Resp: (!) 21   Temp:    SpO2: 94% 90%    CONSTITUTIONAL: Chronically ill-appearing, NAD NEURO/PSYCH: Awake, alert, oriented, slurred speech, right-sided deficits EYES:  eyes equal and reactive ENT/NECK:  no LAD, no JVD CARDIO: Regular rate, well-perfused, normal S1 and S2 PULM:  CTAB no wheezing or rhonchi GI/GU:  non-distended, non-tender MSK/SPINE:  No gross deformities, no edema SKIN:  no rash, atraumatic   *Additional and/or pertinent findings included in MDM below  Diagnostic and Interventional Summary    EKG Interpretation  Date/Time:  August 07, 2022 at 04:43:54 Ventricular Rate:   93 PR Interval:   149 QRS Duration:  354 QT Interval:  441  QTC Calculation:   R Axis:     Text Interpretation: Sinus rhythm       Labs Reviewed  BASIC METABOLIC PANEL - Abnormal; Notable for the following components:      Result Value   Glucose, Bld 120 (*)    Calcium 8.4 (*)    All other components within normal limits  D-DIMER, QUANTITATIVE - Abnormal; Notable for the following components:   D-Dimer, Quant 1.90 (*)    All other components within normal limits  CBC  TROPONIN I (HIGH SENSITIVITY)  TROPONIN I (HIGH SENSITIVITY)    DG Chest Port 1 View  Final Result    CT HEAD WO CONTRAST (5MM)    (Results Pending)  CT CERVICAL SPINE WO CONTRAST    (Results Pending)  CT Angio Chest Pulmonary Embolism (PE) W or WO Contrast    (Results Pending)    Medications  sodium chloride 0.9 % bolus 1,000 mL (0 mLs Intravenous Stopped 08/07/22 0616)     Procedures  /  Critical Care Procedures  ED Course and Medical Decision Making  Initial Impression and Ddx Patient complaining of shortness of breath over the past few days however in no acute respiratory distress on my exam, lungs largely clear, vital signs normal, on room air.  History of COPD, lung cancer.  Differential  diagnosis includes pneumonia, COPD exacerbation, PE.  Awaiting labs, chest x-ray.  Past medical/surgical history that increases complexity of ED encounter: Lung cancer, COPD  Interpretation of Diagnostics I personally reviewed the EKG and my interpretation is as follows: Sinus rhythm  Labs revealing no significant blood count or electrolyte disturbance, D-dimer is positive, awaiting CTA.  Given the recent falls will also obtain CT head.  Patient Reassessment and Ultimate Disposition/Management Clinical Course as of 08/07/22 5361  Sun Aug 07, 2022  0540 Further information from caregiver who is now at bedside.  2 falls yesterday, some abrasions to the right elbow and knee but patient has normal range of motion and is not endorsing any pain, doubt fracture.  Neurovascularly intact.  No loss of consciousness, no significant signs of head trauma, no blood thinners, doubt intracranial bleeding. [MB]    Clinical Course User Index [MB] Maudie Flakes, MD     Signed out to oncoming provider.  Patient management required discussion with the following services or consulting groups:  None  Complexity of Problems Addressed Acute illness or injury that poses threat of life of bodily function  Additional Data Reviewed and Analyzed Further history obtained from: Further history from spouse/family member  Additional Factors Impacting ED Encounter Risk Consideration of hospitalization  Barth Kirks. Sedonia Small, [place] [email or web address]  Final Clinical Impressions(s) / ED Diagnoses     ICD-10-CM   1. SOB (shortness of breath)  R06.02       ED Discharge Orders     None        Discharge Instructions Discussed with and Provided to Patient:   Discharge Instructions   None      Maudie Flakes, MD 08/07/22 [phone]

## 2022-08-07 NOTE — ED Provider Notes (Addendum)
  Physical Exam  BP (!) 87/55   Pulse 83   Temp 98.6 F (37 C) (Oral)   Resp (!) 21   Ht 6' (1.829 m)   Wt 74.8 kg   SpO2 90%   BMI 22.38 kg/m   Physical Exam  Procedures  Procedures  ED Course / MDM   Clinical Course as of 08/07/22 0741  Sun Aug 07, 2022  0540 Further information from caregiver who is now at bedside.  2 falls yesterday, some abrasions to the right elbow and knee but patient has normal range of motion and is not endorsing any pain, doubt fracture.  Neurovascularly intact.  No loss of consciousness, no significant signs of head trauma, no blood thinners, doubt intracranial bleeding. [MB]    Clinical Course User Index [MB] Maudie Flakes, MD   Medical Decision Making Amount and/or Complexity of Data Reviewed Labs: ordered. Radiology: ordered. ECG/medicine tests: ordered.  Risk Prescription drug management.   Assuming care of patient from Dr. Sedonia Small   Patient in the ED for shortness of breath.  He has history of COPD, stroke. Workup thus far shows elevated D-dimer. Patient's BP slightly low, but appears to be baseline.  He is not symptomatic with it.  IV fluid ordered.  Concerning findings are as following -Labs are overall reassuring besides a D-dimer Important pending results are CT PE  According to Dr. Sedonia Small, plan is to reassess the patient after CT PE.  He anticipates that patient can be discharged if the CT scan is negative.  Patient had no complains, no concerns from the nursing side. Will continue to monitor.    Varney Biles, MD 08/07/22 [phone]  CT is neg. Pt's BP is stable. He denies any dizziness. Results discussed with him. He is comfortable going home. Discussed fall precautions.     The patient appears reasonably screened and/or stabilized for discharge and I doubt any other medical condition or other [facility] requiring further screening, evaluation, or treatment in the ED at this time prior to discharge.   Results from the ER workup  discussed with the patient face to face and all questions answered to the best of my ability. The patient is safe for discharge with strict return precautions.     Varney Biles, MD 08/07/22 1133

## 2022-08-07 NOTE — Discharge Instructions (Signed)
We saw you in the ER for shortness of breath. All the results in the ER are normal, labs and imaging. No blood clots. We are not sure what is causing your symptoms. The workup in the ER is not complete, and is limited to screening for life threatening and emergent conditions only, so please see a primary care doctor for further evaluation.

## 2022-08-08 ENCOUNTER — Other Ambulatory Visit: Payer: Self-pay

## 2022-08-08 ENCOUNTER — Ambulatory Visit: Payer: Medicaid Other | Admitting: Radiation Oncology

## 2022-08-08 ENCOUNTER — Ambulatory Visit
Admission: RE | Admit: 2022-08-08 | Discharge: 2022-08-08 | Disposition: A | Discharge status: Home / Self Care | Payer: Medicaid Other | Source: Ambulatory Visit | Attending: Radiation Oncology | Admitting: Radiation Oncology

## 2022-08-08 ENCOUNTER — Inpatient Hospital Stay: Payer: Medicaid Other

## 2022-08-08 DIAGNOSIS — Z510 Encounter for antineoplastic radiation therapy: Secondary | ICD-10-CM | POA: Diagnosis not present

## 2022-08-08 DIAGNOSIS — C3492 Malignant neoplasm of unspecified part of left bronchus or lung: Secondary | ICD-10-CM

## 2022-08-08 LAB — CBC WITH DIFFERENTIAL (CANCER CENTER ONLY)
Abs Immature Granulocytes: 0.12 10*3/uL — ABNORMAL HIGH (ref 0.00–0.07)
Basophils Absolute: 0 10*3/uL (ref 0.0–0.1)
Basophils Relative: 0 %
Eosinophils Absolute: 0 10*3/uL (ref 0.0–0.5)
Eosinophils Relative: 0 %
HCT: 31.3 % — ABNORMAL LOW (ref 39.0–52.0)
Hemoglobin: 10.8 g/dL — ABNORMAL LOW (ref 13.0–17.0)
Immature Granulocytes: 2 %
Lymphocytes Relative: 4 %
Lymphs Abs: 0.2 10*3/uL — ABNORMAL LOW (ref 0.7–4.0)
MCH: 30.3 pg (ref 26.0–34.0)
MCHC: 34.5 g/dL (ref 30.0–36.0)
MCV: 87.9 fL (ref 80.0–100.0)
Monocytes Absolute: 0.3 10*3/uL (ref 0.1–1.0)
Monocytes Relative: 5 %
Neutro Abs: 5.6 10*3/uL (ref 1.7–7.7)
Neutrophils Relative %: 89 %
Platelet Count: 78 10*3/uL — ABNORMAL LOW (ref 150–400)
RBC: 3.56 MIL/uL — ABNORMAL LOW (ref 4.22–5.81)
RDW: 13.5 % (ref 11.5–15.5)
WBC Count: 6.3 10*3/uL (ref 4.0–10.5)
nRBC: 0 % (ref 0.0–0.2)

## 2022-08-08 LAB — CMP (CANCER CENTER ONLY)
ALT: 15 U/L (ref 0–44)
AST: 15 U/L (ref 15–41)
Albumin: 3.4 g/dL — ABNORMAL LOW (ref 3.5–5.0)
Alkaline Phosphatase: 59 U/L (ref 38–126)
Anion gap: 7 (ref 5–15)
BUN: 13 mg/dL (ref 8–23)
CO2: 26 mmol/L (ref 22–32)
Calcium: 8.9 mg/dL (ref 8.9–10.3)
Chloride: 99 mmol/L (ref 98–111)
Creatinine: 0.78 mg/dL (ref 0.61–1.24)
GFR, Estimated: >60 mL/min (ref >60)
Glucose, Bld: 158 mg/dL — ABNORMAL HIGH (ref 70–99)
Potassium: 3.4 mmol/L — ABNORMAL LOW (ref 3.5–5.1)
Sodium: 132 mmol/L — ABNORMAL LOW (ref 135–145)
Total Bilirubin: 1.3 mg/dL — ABNORMAL HIGH (ref 0.3–1.2)
Total Protein: 6.4 g/dL — ABNORMAL LOW (ref 6.5–8.1)

## 2022-08-08 LAB — RAD ONC ARIA SESSION SUMMARY
Course Elapsed Days: 35
Plan Fractions Treated to Date: 25
Plan Prescribed Dose Per Fraction: 2 Gy
Plan Total Fractions Prescribed: 30
Plan Total Prescribed Dose: 60 Gy
Reference Point Dosage Given to Date: 48.9619 Gy
Reference Point Session Dosage Given: 2 Gy
Session Number: 25

## 2022-08-08 NOTE — Progress Notes (Signed)
Per Dr. Gerre Couch tx today, Pt's Plts 78 K/uL. Pt to report back for infusion next week as planned.  RN educated Pt and family and provided paper copy of labs and schedule. Pt and family verbalized understanding.  Pt assisted in w/c to lobby by family in stable condition.

## 2022-08-09 ENCOUNTER — Ambulatory Visit
Admission: RE | Admit: 2022-08-09 | Discharge: 2022-08-09 | Disposition: A | Discharge status: Home / Self Care | Payer: Medicaid Other | Source: Ambulatory Visit | Attending: Radiation Oncology | Admitting: Radiation Oncology

## 2022-08-09 ENCOUNTER — Other Ambulatory Visit: Payer: Self-pay

## 2022-08-09 DIAGNOSIS — C3411 Malignant neoplasm of upper lobe, right bronchus or lung: Secondary | ICD-10-CM | POA: Diagnosis present

## 2022-08-09 DIAGNOSIS — Z87891 Personal history of nicotine dependence: Secondary | ICD-10-CM | POA: Diagnosis not present

## 2022-08-09 DIAGNOSIS — C3412 Malignant neoplasm of upper lobe, left bronchus or lung: Secondary | ICD-10-CM | POA: Diagnosis present

## 2022-08-09 DIAGNOSIS — Z5111 Encounter for antineoplastic chemotherapy: Secondary | ICD-10-CM | POA: Diagnosis not present

## 2022-08-09 DIAGNOSIS — Z510 Encounter for antineoplastic radiation therapy: Secondary | ICD-10-CM | POA: Diagnosis not present

## 2022-08-09 LAB — RAD ONC ARIA SESSION SUMMARY
Course Elapsed Days: 36
Plan Fractions Treated to Date: 26
Plan Prescribed Dose Per Fraction: 2 Gy
Plan Total Fractions Prescribed: 30
Plan Total Prescribed Dose: 60 Gy
Reference Point Dosage Given to Date: 50.9619 Gy
Reference Point Session Dosage Given: 2 Gy
Session Number: 26

## 2022-08-10 ENCOUNTER — Other Ambulatory Visit: Payer: Self-pay

## 2022-08-10 ENCOUNTER — Ambulatory Visit
Admission: RE | Admit: 2022-08-10 | Discharge: 2022-08-10 | Disposition: A | Discharge status: Home / Self Care | Payer: Medicaid Other | Source: Ambulatory Visit | Attending: Radiation Oncology | Admitting: Radiation Oncology

## 2022-08-10 LAB — RAD ONC ARIA SESSION SUMMARY
Course Elapsed Days: 37
Plan Fractions Treated to Date: 27
Plan Prescribed Dose Per Fraction: 2 Gy
Plan Total Fractions Prescribed: 30
Plan Total Prescribed Dose: 60 Gy
Reference Point Dosage Given to Date: 52.9619 Gy
Reference Point Session Dosage Given: 2 Gy
Session Number: 27

## 2022-08-11 ENCOUNTER — Ambulatory Visit
Admission: RE | Admit: 2022-08-11 | Discharge: 2022-08-11 | Disposition: A | Discharge status: Home / Self Care | Payer: Medicaid Other | Source: Ambulatory Visit | Attending: Radiation Oncology | Admitting: Radiation Oncology

## 2022-08-11 ENCOUNTER — Other Ambulatory Visit: Payer: Self-pay

## 2022-08-11 LAB — RAD ONC ARIA SESSION SUMMARY
Course Elapsed Days: 38
Plan Fractions Treated to Date: 28
Plan Prescribed Dose Per Fraction: 2 Gy
Plan Total Fractions Prescribed: 30
Plan Total Prescribed Dose: 60 Gy
Reference Point Dosage Given to Date: 54.9619 Gy
Reference Point Session Dosage Given: 2 Gy
Session Number: 28

## 2022-08-12 ENCOUNTER — Other Ambulatory Visit: Payer: Self-pay

## 2022-08-12 ENCOUNTER — Encounter: Payer: Medicaid Other | Admitting: Physical Medicine & Rehabilitation

## 2022-08-12 ENCOUNTER — Ambulatory Visit
Admission: RE | Admit: 2022-08-12 | Discharge: 2022-08-12 | Disposition: A | Discharge status: Home / Self Care | Payer: Medicaid Other | Source: Ambulatory Visit | Attending: Radiation Oncology | Admitting: Radiation Oncology

## 2022-08-12 ENCOUNTER — Emergency Department (HOSPITAL_COMMUNITY): Payer: Medicaid Other

## 2022-08-12 ENCOUNTER — Inpatient Hospital Stay (HOSPITAL_COMMUNITY)
Admission: EM | Admit: 2022-08-12 | Discharge: 2022-08-16 | DRG: 871 | Disposition: A | Discharge status: Home Health | Payer: Medicaid Other | Attending: Internal Medicine | Admitting: Internal Medicine

## 2022-08-12 ENCOUNTER — Encounter: Payer: Self-pay | Admitting: Physical Medicine & Rehabilitation

## 2022-08-12 VITALS — BP 91/63 | HR 98 | Ht 72.0 in

## 2022-08-12 DIAGNOSIS — I5042 Chronic combined systolic (congestive) and diastolic (congestive) heart failure: Secondary | ICD-10-CM | POA: Diagnosis present

## 2022-08-12 DIAGNOSIS — Z808 Family history of malignant neoplasm of other organs or systems: Secondary | ICD-10-CM

## 2022-08-12 DIAGNOSIS — Z87898 Personal history of other specified conditions: Secondary | ICD-10-CM

## 2022-08-12 DIAGNOSIS — Z841 Family history of disorders of kidney and ureter: Secondary | ICD-10-CM | POA: Diagnosis not present

## 2022-08-12 DIAGNOSIS — I69322 Dysarthria following cerebral infarction: Secondary | ICD-10-CM

## 2022-08-12 DIAGNOSIS — Z66 Do not resuscitate: Secondary | ICD-10-CM | POA: Diagnosis present

## 2022-08-12 DIAGNOSIS — D84821 Immunodeficiency due to drugs: Secondary | ICD-10-CM | POA: Diagnosis present

## 2022-08-12 DIAGNOSIS — J441 Chronic obstructive pulmonary disease with (acute) exacerbation: Secondary | ICD-10-CM | POA: Diagnosis present

## 2022-08-12 DIAGNOSIS — E876 Hypokalemia: Secondary | ICD-10-CM | POA: Diagnosis present

## 2022-08-12 DIAGNOSIS — D61810 Antineoplastic chemotherapy induced pancytopenia: Secondary | ICD-10-CM | POA: Diagnosis present

## 2022-08-12 DIAGNOSIS — K219 Gastro-esophageal reflux disease without esophagitis: Secondary | ICD-10-CM | POA: Diagnosis present

## 2022-08-12 DIAGNOSIS — R1312 Dysphagia, oropharyngeal phase: Secondary | ICD-10-CM

## 2022-08-12 DIAGNOSIS — R262 Difficulty in walking, not elsewhere classified: Secondary | ICD-10-CM | POA: Diagnosis present

## 2022-08-12 DIAGNOSIS — E860 Dehydration: Secondary | ICD-10-CM | POA: Diagnosis present

## 2022-08-12 DIAGNOSIS — R079 Chest pain, unspecified: Principal | ICD-10-CM

## 2022-08-12 DIAGNOSIS — Z79899 Other long term (current) drug therapy: Secondary | ICD-10-CM

## 2022-08-12 DIAGNOSIS — I63512 Cerebral infarction due to unspecified occlusion or stenosis of left middle cerebral artery: Secondary | ICD-10-CM | POA: Insufficient documentation

## 2022-08-12 DIAGNOSIS — E785 Hyperlipidemia, unspecified: Secondary | ICD-10-CM | POA: Diagnosis present

## 2022-08-12 DIAGNOSIS — C3492 Malignant neoplasm of unspecified part of left bronchus or lung: Secondary | ICD-10-CM

## 2022-08-12 DIAGNOSIS — I5022 Chronic systolic (congestive) heart failure: Secondary | ICD-10-CM | POA: Diagnosis present

## 2022-08-12 DIAGNOSIS — Z87891 Personal history of nicotine dependence: Secondary | ICD-10-CM | POA: Diagnosis not present

## 2022-08-12 DIAGNOSIS — A419 Sepsis, unspecified organism: Principal | ICD-10-CM | POA: Diagnosis present

## 2022-08-12 DIAGNOSIS — R0789 Other chest pain: Secondary | ICD-10-CM | POA: Diagnosis present

## 2022-08-12 DIAGNOSIS — T451X5A Adverse effect of antineoplastic and immunosuppressive drugs, initial encounter: Secondary | ICD-10-CM | POA: Diagnosis present

## 2022-08-12 DIAGNOSIS — D61811 Other drug-induced pancytopenia: Secondary | ICD-10-CM | POA: Diagnosis not present

## 2022-08-12 DIAGNOSIS — D649 Anemia, unspecified: Secondary | ICD-10-CM | POA: Diagnosis not present

## 2022-08-12 DIAGNOSIS — J189 Pneumonia, unspecified organism: Secondary | ICD-10-CM | POA: Diagnosis present

## 2022-08-12 DIAGNOSIS — I69392 Facial weakness following cerebral infarction: Secondary | ICD-10-CM | POA: Diagnosis not present

## 2022-08-12 DIAGNOSIS — C3490 Malignant neoplasm of unspecified part of unspecified bronchus or lung: Secondary | ICD-10-CM | POA: Diagnosis present

## 2022-08-12 DIAGNOSIS — J850 Gangrene and necrosis of lung: Secondary | ICD-10-CM | POA: Diagnosis present

## 2022-08-12 DIAGNOSIS — D638 Anemia in other chronic diseases classified elsewhere: Secondary | ICD-10-CM | POA: Diagnosis present

## 2022-08-12 DIAGNOSIS — I2782 Chronic pulmonary embolism: Secondary | ICD-10-CM | POA: Diagnosis present

## 2022-08-12 DIAGNOSIS — Z79632 Long term (current) use of antitumor antibiotic: Secondary | ICD-10-CM | POA: Diagnosis not present

## 2022-08-12 DIAGNOSIS — I69320 Aphasia following cerebral infarction: Secondary | ICD-10-CM

## 2022-08-12 DIAGNOSIS — R569 Unspecified convulsions: Secondary | ICD-10-CM

## 2022-08-12 DIAGNOSIS — I70201 Unspecified atherosclerosis of native arteries of extremities, right leg: Secondary | ICD-10-CM | POA: Diagnosis present

## 2022-08-12 DIAGNOSIS — I110 Hypertensive heart disease with heart failure: Secondary | ICD-10-CM | POA: Diagnosis present

## 2022-08-12 DIAGNOSIS — Z7902 Long term (current) use of antithrombotics/antiplatelets: Secondary | ICD-10-CM

## 2022-08-12 DIAGNOSIS — I69959 Hemiplegia and hemiparesis following unspecified cerebrovascular disease affecting unspecified side: Secondary | ICD-10-CM | POA: Diagnosis not present

## 2022-08-12 DIAGNOSIS — I69391 Dysphagia following cerebral infarction: Secondary | ICD-10-CM

## 2022-08-12 DIAGNOSIS — R4701 Aphasia: Secondary | ICD-10-CM

## 2022-08-12 DIAGNOSIS — Z800 Family history of malignant neoplasm of digestive organs: Secondary | ICD-10-CM

## 2022-08-12 DIAGNOSIS — J440 Chronic obstructive pulmonary disease with acute lower respiratory infection: Secondary | ICD-10-CM | POA: Diagnosis present

## 2022-08-12 DIAGNOSIS — Z7951 Long term (current) use of inhaled steroids: Secondary | ICD-10-CM

## 2022-08-12 DIAGNOSIS — I69351 Hemiplegia and hemiparesis following cerebral infarction affecting right dominant side: Secondary | ICD-10-CM

## 2022-08-12 HISTORY — DX: Malignant neoplasm of unspecified part of unspecified bronchus or lung: C34.90

## 2022-08-12 LAB — COMPREHENSIVE METABOLIC PANEL
ALT: 33 U/L (ref 0–44)
AST: 28 U/L (ref 15–41)
Albumin: 2.1 g/dL — ABNORMAL LOW (ref 3.5–5.0)
Alkaline Phosphatase: 61 U/L (ref 38–126)
Anion gap: 8 (ref 5–15)
BUN: 17 mg/dL (ref 8–23)
CO2: 29 mmol/L (ref 22–32)
Calcium: 8.1 mg/dL — ABNORMAL LOW (ref 8.9–10.3)
Chloride: 97 mmol/L — ABNORMAL LOW (ref 98–111)
Creatinine, Ser: 0.85 mg/dL (ref 0.61–1.24)
GFR, Estimated: >60 mL/min (ref >60)
Glucose, Bld: 113 mg/dL — ABNORMAL HIGH (ref 70–99)
Potassium: 3 mmol/L — ABNORMAL LOW (ref 3.5–5.1)
Sodium: 134 mmol/L — ABNORMAL LOW (ref 135–145)
Total Bilirubin: 0.7 mg/dL (ref 0.3–1.2)
Total Protein: 5.6 g/dL — ABNORMAL LOW (ref 6.5–8.1)

## 2022-08-12 LAB — CBC WITH DIFFERENTIAL/PLATELET
Abs Immature Granulocytes: 0.01 10*3/uL (ref 0.00–0.07)
Basophils Absolute: 0 10*3/uL (ref 0.0–0.1)
Basophils Relative: 0 %
Eosinophils Absolute: 0 10*3/uL (ref 0.0–0.5)
Eosinophils Relative: 0 %
HCT: 23.5 % — ABNORMAL LOW (ref 39.0–52.0)
Hemoglobin: 8 g/dL — ABNORMAL LOW (ref 13.0–17.0)
Immature Granulocytes: 0 %
Lymphocytes Relative: 12 %
Lymphs Abs: 0.3 10*3/uL — ABNORMAL LOW (ref 0.7–4.0)
MCH: 30.8 pg (ref 26.0–34.0)
MCHC: 34 g/dL (ref 30.0–36.0)
MCV: 90.4 fL (ref 80.0–100.0)
Monocytes Absolute: 0.3 10*3/uL (ref 0.1–1.0)
Monocytes Relative: 12 %
Neutro Abs: 2 10*3/uL (ref 1.7–7.7)
Neutrophils Relative %: 76 %
Platelets: 114 10*3/uL — ABNORMAL LOW (ref 150–400)
RBC: 2.6 MIL/uL — ABNORMAL LOW (ref 4.22–5.81)
RDW: 14 % (ref 11.5–15.5)
WBC: 2.6 10*3/uL — ABNORMAL LOW (ref 4.0–10.5)
nRBC: 0 % (ref 0.0–0.2)

## 2022-08-12 LAB — RAD ONC ARIA SESSION SUMMARY
Course Elapsed Days: 39
Plan Fractions Treated to Date: 29
Plan Prescribed Dose Per Fraction: 2 Gy
Plan Total Fractions Prescribed: 30
Plan Total Prescribed Dose: 60 Gy
Reference Point Dosage Given to Date: 56.9619 Gy
Reference Point Session Dosage Given: 2 Gy
Session Number: 29

## 2022-08-12 LAB — I-STAT CHEM 8, ED
BUN: 15 mg/dL (ref 8–23)
Calcium, Ion: 1.07 mmol/L — ABNORMAL LOW (ref 1.15–1.40)
Chloride: 93 mmol/L — ABNORMAL LOW (ref 98–111)
Creatinine, Ser: 0.7 mg/dL (ref 0.61–1.24)
Glucose, Bld: 114 mg/dL — ABNORMAL HIGH (ref 70–99)
HCT: 21 % — ABNORMAL LOW (ref 39.0–52.0)
Hemoglobin: 7.1 g/dL — ABNORMAL LOW (ref 13.0–17.0)
Potassium: 3.1 mmol/L — ABNORMAL LOW (ref 3.5–5.1)
Sodium: 134 mmol/L — ABNORMAL LOW (ref 135–145)
TCO2: 27 mmol/L (ref 22–32)

## 2022-08-12 LAB — TROPONIN I (HIGH SENSITIVITY): Troponin I (High Sensitivity): 6 ng/L (ref <18)

## 2022-08-12 LAB — POC OCCULT BLOOD, ED: Fecal Occult Bld: POSITIVE — AB

## 2022-08-12 LAB — MAGNESIUM: Magnesium: 1.6 mg/dL — ABNORMAL LOW (ref 1.7–2.4)

## 2022-08-12 MED ORDER — LACTATED RINGERS IV BOLUS
500.0000 mL | Freq: Once | INTRAVENOUS | Status: AC
  Administered 2022-08-13: 500 mL via INTRAVENOUS

## 2022-08-12 MED ORDER — VANCOMYCIN HCL IN DEXTROSE 1-5 GM/200ML-% IV SOLN
1000.0000 mg | Freq: Once | INTRAVENOUS | Status: AC
  Administered 2022-08-13: 1000 mg via INTRAVENOUS
  Filled 2022-08-12: qty 200

## 2022-08-12 MED ORDER — SODIUM CHLORIDE 0.9 % IV SOLN
500.0000 mg | Freq: Once | INTRAVENOUS | Status: AC
  Administered 2022-08-13: 500 mg via INTRAVENOUS
  Filled 2022-08-12: qty 5

## 2022-08-12 MED ORDER — IOHEXOL 350 MG/ML SOLN
80.0000 mL | Freq: Once | INTRAVENOUS | Status: AC | PRN
  Administered 2022-08-12: 80 mL via INTRAVENOUS

## 2022-08-12 MED ORDER — SODIUM CHLORIDE 0.9 % IV SOLN
2.0000 g | Freq: Once | INTRAVENOUS | Status: AC
  Administered 2022-08-12: 2 g via INTRAVENOUS
  Filled 2022-08-12: qty 20

## 2022-08-12 MED FILL — Dexamethasone Sodium Phosphate Inj 100 MG/10ML: INTRAMUSCULAR | Qty: 1 | Status: AC

## 2022-08-12 NOTE — Progress Notes (Unsigned)
Subjective:    Patient ID: Todd Estrada, male    DOB: 1961/06/29, 61 y.o.   MRN: [id number]  HPI Hospital HPI "Todd Estrada is a 61 y.o. male who presented to [facility] emergency department on 03/02/2022 with confusion, slurred speech and right-sided weakness.  Code stroke was initiated and neurology consulted.  Not tPA candidate secondary to unknown LK W.  CTA was concerning for occlusion of the left ICA and Dr. Dimas [place] was consulted.  He underwent embolectomy and balloon angioplasty and treated with dual antiplatelet medication.  He developed aspiration and required intubation mechanical ventilation.  MRI of the brain on 3/17 worrisome for acute left MCA territory infarcts.  History of alcohol abuse on CIWA protocol.  Extubated but required reintubation multiple times.  Eventually underwent tracheostomy on 3/22.  Unable to place core track and underwent bedside PEG placement on 3/24.  Maintained on Plavix and aspirin.  He was tolerating trach collar and trach tube changed to cuffless #6 on 3/30.       Todd Estrada was admitted to rehab 03/22/2022 for inpatient therapies to consist of PT, ST and OT at least three hours five days a week. Past admission physiatrist, therapy team and rehab RN have worked together to provide customized collaborative inpatient rehab.  Remained globally aphasic.  N.p.o. with continuous tube feeds.  Tracheostomy tube was partially dislodged on 4/5.  Respiratory therapy able to reproduce tube.  Pulmonology/critical care medicine reevaluated and will follow weekly.  Multiple superficial ulcers of bilateral toes noted.     He underwent right lower extremity venous Doppler study to rule out DVT on 4/5.  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  Vascular surgery consultation obtained and ABIs performed.  Right ABI 0.51, left 0.41 with monophasic signals.  Continuous tube feeds adjusted to bolus feeds.   Patient tolerating well.  Keflex started on 4/9 for UTI>>completed on 4/15.    Patient noted to be lethargic on the morning of 4/10.  Requiring increased FiO2 from 35 to 60%.  Concern for hypercapnia.  Arterial blood gases and chest x-ray performed.  Pulmonology at bedside to reassess. Nothing acute on xray and normal pCo2. Likely mucous plug causing hypoxia.  Todd Estrada discontinued as this was causing oral dryness and not affecting airway secretions. Pulmonologist rec: suctioning and saline. Tried removing wrist restraints but patient kept pulling and removing condom catheter.    Hematuria noticed 4/13 and Lovenox discontinued. Serial CBCs performed. These remained stable and were discontinued. Urine now clear. Much improved mental status and able to discontinue wrist restraints on 4/14. Appetite and dysphagia improving. Diet advanced. Unable to cap trach due to mucous plugging. Routine trach change performed on 4/17. Wearing PMV. Tube feedings discontinued and patient taking 100% meals by mouth on 4/19. Nicoderm dosing titrated down. Tracheostomy decannulated by CCM on 4/24. Follow-up MBS completed on 4/26>>recommend fine chopped solids (dysphagia 2) and thin liquids, medications crushed with pureed food, full supervision.   Blood pressures were monitored on TID basis and relatively soft but remained stable."   ------------------- Interval History Todd Estrada is a 61 year old male who is here for follow-up after  recent left MCA territory infarcts.  Family reports that Todd Estrada is doing well after discharge from the hospital and during rehabilitation.  Unfortunately he has had a decrease in his function since he had to initiate chemotherapy and radiation for his lung cancer.  He has been undergoing chemo  and radiation for about 2 months for his left lung cancer and family reports he will have further treatment for his right side.  Prior to cancer treatment family reports he was ambulating much better and  was not requiring any devices for assistance other than his AFO.  Since cancer therapy is only able to walk very short distances with assistance of a family member.  He is not having any swallowing difficulties but his appetite is much decreased.  He is eating ensures does help with this.  He continues to have severe aphasia however he is able to say few more words than previously.  He is still working with SLP.  After PT OT was stopped and chemo was started patient did not have any energy to continue with his exercises and has had very little activity due to this.   Pain Inventory Average Pain 0 Pain Right Now 0 My pain is  no pain  LOCATION OF PAIN  no pain  BOWEL Number of stools per week: 1   BLADDER Normal    Mobility use a wheelchair needs help with transfers  Function I need assistance with the following:  dressing, bathing, toileting, meal prep, household duties, and shopping  Neuro/Psych bowel control problems weakness tremor trouble walking  Prior Studies Any changes since last visit?  no  Physicians involved in your care Any changes since last visit?  no   Family History  Problem Relation Age of Onset   Throat cancer Mother    Liver cancer Father    Kidney failure Sister    Cancer - Lung Paternal Uncle    Social History   Socioeconomic History   Marital status: Unknown    Spouse name: Not on file   Number of children: Not on file   Years of education: Not on file   Highest education level: Not on file  Occupational History   Not on file  Tobacco Use   Smoking status: Former    Types: Cigarettes    Quit date: 05/02/2022    Years since quitting: 0.2    Passive exposure: Never   Smokeless tobacco: Never  Vaping Use   Vaping Use: Never used  Substance and Sexual Activity   Alcohol use: Not Currently   Drug use: No   Sexual activity: Not on file  Other Topics Concern   Not on file  Social History Narrative   Not on file   Social  Determinants of Health   Financial Resource Strain: Not on file  Food Insecurity: Not on file  Transportation Needs: Not on file  Physical Activity: Not on file  Stress: Not on file  Social Connections: Not on file   Past Surgical History:  Procedure Laterality Date   BRONCHIAL BIOPSY  05/30/2022   Procedure: BRONCHIAL BIOPSIES;  Surgeon: Collene Gobble, MD;  Location: [place];  Service: Pulmonary;;   BRONCHIAL BRUSHINGS  05/30/2022   Procedure: BRONCHIAL BRUSHINGS;  Surgeon: Collene Gobble, MD;  Location: [place];  Service: Pulmonary;;   BRONCHIAL NEEDLE ASPIRATION BIOPSY  05/30/2022   Procedure: BRONCHIAL NEEDLE ASPIRATION BIOPSIES;  Surgeon: Collene Gobble, MD;  Location: [place];  Service: Pulmonary;;   BRONCHIAL WASHINGS  05/30/2022   Procedure: BRONCHIAL WASHINGS;  Surgeon: Collene Gobble, MD;  Location: [facility] ENDOSCOPY;  Service: Pulmonary;;   ESOPHAGOGASTRODUODENOSCOPY (EGD) WITH PROPOFOL N/A 03/11/2022   Procedure: ESOPHAGOGASTRODUODENOSCOPY (EGD) WITH PROPOFOL;  Surgeon: Georganna Skeans, MD;  Location: [place];  Service: General;  Laterality: N/A;  FIDUCIAL MARKER PLACEMENT  05/30/2022   Procedure: FIDUCIAL MARKER PLACEMENT;  Surgeon: Collene Gobble, MD;  Location: [facility] ENDOSCOPY;  Service: Pulmonary;;   IR ANGIO INTRA EXTRACRAN SEL COM CAROTID INNOMINATE UNI R MOD SED  03/02/2022   IR CT HEAD LTD  03/02/2022   IR PERCUTANEOUS ART THROMBECTOMY/INFUSION INTRACRANIAL INC DIAG ANGIO  03/02/2022   PEG PLACEMENT N/A 03/11/2022   Procedure: PERCUTANEOUS ENDOSCOPIC GASTROSTOMY (PEG) PLACEMENT;  Surgeon: Georganna Skeans, MD;  Location: [place];  Service: General;  Laterality: N/A;   RADIOLOGY WITH ANESTHESIA N/A 03/02/2022   Procedure: IR WITH ANESTHESIA;  Surgeon: Luanne Bras, MD;  Location: [place];  Service: Radiology;  Laterality: N/A;   VIDEO BRONCHOSCOPY WITH RADIAL ENDOBRONCHIAL ULTRASOUND  05/30/2022   Procedure: VIDEO BRONCHOSCOPY WITH RADIAL ENDOBRONCHIAL  ULTRASOUND;  Surgeon: Collene Gobble, MD;  Location: [facility] ENDOSCOPY;  Service: Pulmonary;;   Past Medical History:  Diagnosis Date   Asthma    COPD (chronic obstructive pulmonary disease) ([place])    GERD (gastroesophageal reflux disease)    History of tracheostomy    03/09/22-04/11/22   HLD (hyperlipidemia)    Hypertension    PAD (peripheral artery disease) (HCC)    Seizures ([place]) 06/02/2022   Stroke ([place]) 02/2022   BP 91/63   Pulse 98   Ht 6' (1.829 m)   SpO2 94%   BMI 22.38 kg/m   Opioid Risk Score:   Fall Risk Score:  `1  Depression screen [facility] 2/9     08/12/2022    2:49 PM 06/22/2022    3:39 PM 05/09/2022    3:16 PM 04/28/2022   11:35 AM  Depression screen PHQ 2/9  Decreased Interest 0 0 0 0  Down, Depressed, Hopeless 0 0 0 1  PHQ - 2 Score 0 0 0 1  Altered sleeping   0 0  Tired, decreased energy   2 0  Change in appetite   0 0  Feeling bad or failure about yourself    0 0  Trouble concentrating   0 0  Moving slowly or fidgety/restless   3 0  Suicidal thoughts   0 0  PHQ-9 Score   5 1  Difficult doing work/chores   Somewhat difficult Not difficult at all     Review of Systems  Constitutional:  Positive for appetite change and unexpected weight change.  HENT: Negative.    Eyes: Negative.   Respiratory: Negative.    Cardiovascular: Negative.   Gastrointestinal:  Positive for constipation.  Endocrine: Negative.   Genitourinary: Negative.   Musculoskeletal:  Positive for gait problem.  Skin: Negative.   Allergic/Immunologic: Negative.   Neurological:  Positive for tremors and weakness.  Hematological: Negative.   Psychiatric/Behavioral: Negative.        Objective:   Physical Exam  Objective: Gen: no distress, normal appearing HEENT: oral mucosa pink and moist, NCAT, prior trach site healed Cardio: Reg rate Chest: normal effort, normal rate of breathing Abd: soft, non-distended.   Ext: no edema Psych: pleasant, normal affect Skin: intact Neuro: Mild  right facial weakness.  Patient is alert.  He is in no wheelchair.  He has significant expressive aphasia.  He is able to say yes or no to questions.  He is able to follow simple commands.  Sensation intact in all 4 extremities. Musculoskeletal: No joint swelling or tenderness .  Left upper and left lower extremity strength 4/5.  Right lower extremity hip flexion 2-3 out of 5, knee extension 3 to 4-  out of 5, AFO in place.  Right upper extremity 3 out of 5 proximally, 1/5 finger flexion.      Assessment & Plan:   Todd Estrada is a 61 y.o. male who had a left MCA CVA   Left MCA stroke with right-sided weakness, and aphasia -Unfortunately he has had a significant decline in function since initiating chemotherapy and radiation.  He has been very inactive due to fatigue from chemotherapy and has had very low activity since this time.   -May consider additional therapy services at a later time to improve function, however would wait until completion of his chemotherapy, to see if he can tolerate further therapy at that time   Dysphagia and aphasia Tolerating oral diet, however low intake  -Continue SLP

## 2022-08-12 NOTE — ED Notes (Signed)
MD's notified and aware of patient's continued low pressures. Patient still maintaining a MAP above 65.

## 2022-08-12 NOTE — ED Provider Notes (Signed)
[facility] EMERGENCY DEPARTMENT Provider Note   CSN: [id number] Arrival date & time: 08/12/22  1959     History  Chief Complaint  Patient presents with   Chest Pain    Todd Estrada is a 61 y.o. male with past medical history of COPD, prior stroke with residual dysarthria, stage IV lung cancer with patient reported active chemotherapy and radiation who presents the emergency department with chief complaint of substernal chest pain.  The patient is only able to contribute minimally to interview due to residual stroke deficit as above.  He is able to state that over the past 3 days he has had chest pain that is not pleuritic in nature.  He states he has had a blood clot in the past does not recall specific details.  He does not take any blood any medication.  He denies having fevers, chills or any other associated symptomology.  His chest pain does not radiate to his back.  He has no associated shortness of breath.  He states that he has had reduced appetite over this timeframe and has not been eating and drinking as much as he normally does.  He is unaware whether or not his chest pain is exertional or not.     Chest Pain Associated symptoms: cough and fatigue   Associated symptoms: no abdominal pain, no back pain, no fever, no palpitations, no shortness of breath and no vomiting        Home Medications Prior to Admission medications   Medication Sig Start Date End Date Taking? Authorizing Provider  atorvastatin (LIPITOR) 40 MG tablet Take 1 tablet (40 mg total) by mouth daily. 05/25/22 05/25/23  Lacinda Axon, MD  budesonide (PULMICORT) 0.5 MG/2ML nebulizer solution USE 2 ML(0.5 MG) VIA NEBULIZER TWICE DAILY 06/27/22   Freddi Starr, MD  clopidogrel (PLAVIX) 75 MG tablet Take 1 tablet (75 mg total) by mouth daily. Okay to restart this medication on 05/31/2022 06/28/22   Gaylan Gerold, DO  folic acid (FOLVITE) 1 MG tablet Take 1 tablet (1 mg total) by mouth daily.  05/09/22   Maudie Mercury, MD  levETIRAcetam (KEPPRA) 500 MG tablet Take 1 tablet (500 mg total) by mouth 2 (two) times daily. 06/02/22   Marcial Pacas, MD  Multiple Vitamin (MULTIVITAMIN WITH MINERALS) TABS tablet Take 1 tablet by mouth daily. 05/19/22   Maudie Mercury, MD  nicotine (NICODERM CQ - DOSED IN MG/24 HOURS) 14 mg/24hr patch Place 1 patch (14 mg total) onto the skin daily. 07/22/22   Atway, Rayann N, DO  ondansetron (ZOFRAN) 4 MG tablet Take 4 mg by mouth every 8 (eight) hours as needed for nausea or vomiting.    [provider]  prochlorperazine (COMPAZINE) 10 MG tablet Take 1 tablet (10 mg total) by mouth every 6 (six) hours as needed for nausea or vomiting. 07/01/22   Curt Bears, MD  STIOLTO RESPIMAT 2.5-2.5 MCG/ACT AERS INHALE 2 PUFFS INTO THE LUNGS DAILY 06/17/22   Freddi Starr, MD      Allergies    Patient has no known allergies.    Review of Systems   Review of Systems  Constitutional:  Positive for appetite change and fatigue. Negative for chills and fever.  HENT:  Negative for ear pain and sore throat.   Eyes:  Negative for pain and visual disturbance.  Respiratory:  Positive for cough. Negative for shortness of breath.   Cardiovascular:  Positive for chest pain. Negative for palpitations.  Gastrointestinal:  Negative for abdominal pain and vomiting.  Genitourinary:  Negative for dysuria and hematuria.  Musculoskeletal:  Negative for arthralgias and back pain.  Skin:  Negative for color change and rash.  Neurological:  Negative for seizures and syncope.  All other systems reviewed and are negative.   Physical Exam Updated Vital Signs BP 93/66   Pulse 95   Temp 98.9 F (37.2 C)   Resp 20   SpO2 95%  Physical Exam Vitals and nursing note reviewed.  Constitutional:      General: He is not in acute distress.    Appearance: He is well-developed.     Comments: Upon entering the exam the patient is lying in bed awake and alert.  He appears chronically  but not acutely ill.  HENT:     Head: Normocephalic and atraumatic.  Eyes:     Conjunctiva/sclera: Conjunctivae normal.  Cardiovascular:     Rate and Rhythm: Normal rate and regular rhythm.     Heart sounds: No murmur heard.    Comments: Regular rate and rhythm no murmurs or gallops Pulmonary:     Effort: Pulmonary effort is normal. No respiratory distress.     Breath sounds: Normal breath sounds.     Comments: Diminished breath sounds diffusely without any focal findings on auscultation.  Patient is not in respiratory distress.  He is moderately tachypneic with a respiratory rate varying in the high teens to low 20s.  He is saturating well on room air. Abdominal:     Palpations: Abdomen is soft.     Tenderness: There is no abdominal tenderness.     Comments: Soft nondistended nontender  Genitourinary:    Comments: Chaperoned rectal exam reveals no active bleeding.  Patient does have hemorrhoids and scant bright red blood per rectum associated with normal brown appearing stool Musculoskeletal:        General: No swelling.     Cervical back: Neck supple.     Comments: Symmetric lower extremities  Skin:    General: Skin is warm and dry.     Capillary Refill: Capillary refill takes less than 2 seconds.  Neurological:     Mental Status: He is alert.  Psychiatric:        Mood and Affect: Mood normal.     ED Results / Procedures / Treatments   Labs (all labs ordered are listed, but only abnormal results are displayed) Labs Reviewed - No data to display  EKG None  Radiology No results found.  Procedures Procedures    Medications Ordered in ED Medications - No data to display  ED Course/ Medical Decision Making/ A&P Clinical Course as of 08/12/22 2348  Fri Aug 12, 2022  2347 Fecal Occult Blood, POC(!): POSITIVE [JG]    Clinical Course User Index [JG] Levie Heritage, MD                           Medical Decision Making Amount and/or Complexity of Data  Reviewed Labs: ordered. Decision-making details documented in ED Course. Radiology: ordered.  Risk Prescription drug management.   Patient presents to the emergency department intermittently tachypneic, with low-grade hypotension associated with chest pain in the setting of end-stage lung cancer.  Differential diagnosis includes postobstructive pneumonia versus, pulmonary embolism versus less likely ACS given lack of clear exertional aspect.  Will obtain basic laboratory work-up, delta troponins and perform CTA chest given high risk for PE and to better characterize presence of pneumonia if  present  I personally reviewed and interpreted the patient's CT scan which reveals concern for necrotizing infection within the left upper lobe.  I have personally reviewed the patient's prior CTA chest performed 5 days prior.  There is significant change in worsening of the patient's left upper lobe as described above today.  Given that the patient is immunocompromised and on active chemotherapy will cover with broad-spectrum antibiotics including vancomycin, Rocephin and azithromycin.  Patient was given 500 cc bolus upon presentation.  We will proceed cautiously with further fluid resuscitation given last ejection fraction in the 40s.  I personally reviewed interpret the patient's laboratory work-up which was markable for hemoglobin of 8 with a most recent hemoglobin of 10.6 4 days prior.   I performed chaperoned rectal exam as above which was markable for external hemorrhoids with normal brown stool and scant bright red blood.   We will admit the patient to hospitalist medicine for necrotizing pneumonia, chest pain, fluid resuscitation and continued septic work-up  Provide an additional 500 cc bolus given improvement with the first bolus.  Patient was admitted to hospitalist medicine in stable condition.        Final Clinical Impression(s) / ED Diagnoses Final diagnoses:  Chest pain, unspecified  type    Rx / DC Orders ED Discharge Orders     None         Levie Heritage, MD 08/12/22 3833    Sherwood Gambler, MD 08/14/22 1550

## 2022-08-12 NOTE — ED Notes (Addendum)
MD notified of low pressures. Verbal order to hang 500 bolus NS.

## 2022-08-12 NOTE — ED Triage Notes (Signed)
Pt BIB EMS for CP that started tonight before calling EMS. Pt has a hx of stage IV lung cancer and dysphagia as well as R sided weakness from a stroke last year. No meds given on route.   Pt slightly hypotensive with EMS, other VSS.

## 2022-08-13 ENCOUNTER — Encounter (HOSPITAL_COMMUNITY): Payer: Self-pay | Admitting: Internal Medicine

## 2022-08-13 DIAGNOSIS — I5042 Chronic combined systolic (congestive) and diastolic (congestive) heart failure: Secondary | ICD-10-CM | POA: Diagnosis present

## 2022-08-13 DIAGNOSIS — J189 Pneumonia, unspecified organism: Secondary | ICD-10-CM | POA: Diagnosis not present

## 2022-08-13 DIAGNOSIS — D649 Anemia, unspecified: Secondary | ICD-10-CM | POA: Diagnosis present

## 2022-08-13 DIAGNOSIS — Z87898 Personal history of other specified conditions: Secondary | ICD-10-CM

## 2022-08-13 DIAGNOSIS — R079 Chest pain, unspecified: Secondary | ICD-10-CM | POA: Diagnosis not present

## 2022-08-13 DIAGNOSIS — D638 Anemia in other chronic diseases classified elsewhere: Secondary | ICD-10-CM | POA: Diagnosis present

## 2022-08-13 DIAGNOSIS — A419 Sepsis, unspecified organism: Secondary | ICD-10-CM | POA: Diagnosis present

## 2022-08-13 DIAGNOSIS — E876 Hypokalemia: Secondary | ICD-10-CM

## 2022-08-13 DIAGNOSIS — I5022 Chronic systolic (congestive) heart failure: Secondary | ICD-10-CM | POA: Diagnosis present

## 2022-08-13 DIAGNOSIS — R0789 Other chest pain: Secondary | ICD-10-CM | POA: Diagnosis present

## 2022-08-13 DIAGNOSIS — J441 Chronic obstructive pulmonary disease with (acute) exacerbation: Secondary | ICD-10-CM | POA: Diagnosis present

## 2022-08-13 HISTORY — DX: Hypomagnesemia: E83.42

## 2022-08-13 HISTORY — DX: Other chest pain: R07.89

## 2022-08-13 HISTORY — DX: Sepsis, unspecified organism: A41.9

## 2022-08-13 HISTORY — DX: Hypokalemia: E87.6

## 2022-08-13 LAB — BRAIN NATRIURETIC PEPTIDE: B Natriuretic Peptide: 39 pg/mL (ref 0.0–100.0)

## 2022-08-13 LAB — CBC WITH DIFFERENTIAL/PLATELET
Abs Immature Granulocytes: 0 10*3/uL (ref 0.00–0.07)
Basophils Absolute: 0 10*3/uL (ref 0.0–0.1)
Basophils Relative: 0 %
Eosinophils Absolute: 0 10*3/uL (ref 0.0–0.5)
Eosinophils Relative: 0 %
HCT: 27.3 % — ABNORMAL LOW (ref 39.0–52.0)
Hemoglobin: 9.1 g/dL — ABNORMAL LOW (ref 13.0–17.0)
Immature Granulocytes: 0 %
Lymphocytes Relative: 4 %
Lymphs Abs: 0.1 10*3/uL — ABNORMAL LOW (ref 0.7–4.0)
MCH: 30.5 pg (ref 26.0–34.0)
MCHC: 33.3 g/dL (ref 30.0–36.0)
MCV: 91.6 fL (ref 80.0–100.0)
Monocytes Absolute: 0.1 10*3/uL (ref 0.1–1.0)
Monocytes Relative: 3 %
Neutro Abs: 1.9 10*3/uL (ref 1.7–7.7)
Neutrophils Relative %: 93 %
Platelets: 110 10*3/uL — ABNORMAL LOW (ref 150–400)
RBC: 2.98 MIL/uL — ABNORMAL LOW (ref 4.22–5.81)
RDW: 14 % (ref 11.5–15.5)
WBC: 2 10*3/uL — ABNORMAL LOW (ref 4.0–10.5)
nRBC: 0 % (ref 0.0–0.2)

## 2022-08-13 LAB — RETICULOCYTES
Immature Retic Fract: 11.5 % (ref 2.3–15.9)
RBC.: 3.35 MIL/uL — ABNORMAL LOW (ref 4.22–5.81)
Retic Count, Absolute: 43.9 10*3/uL (ref 19.0–186.0)
Retic Ct Pct: 1.3 % (ref 0.4–3.1)

## 2022-08-13 LAB — PROCALCITONIN: Procalcitonin: <0.1 ng/mL

## 2022-08-13 LAB — URINALYSIS, COMPLETE (UACMP) WITH MICROSCOPIC
Bacteria, UA: NONE SEEN
Bilirubin Urine: NEGATIVE
Glucose, UA: NEGATIVE mg/dL
Hgb urine dipstick: NEGATIVE
Ketones, ur: NEGATIVE mg/dL
Leukocytes,Ua: NEGATIVE
Nitrite: NEGATIVE
Protein, ur: NEGATIVE mg/dL
Specific Gravity, Urine: 1.036 — ABNORMAL HIGH (ref 1.005–1.030)
pH: 6 (ref 5.0–8.0)

## 2022-08-13 LAB — IRON AND TIBC
Iron: 44 ug/dL — ABNORMAL LOW (ref 45–182)
Saturation Ratios: 27 % (ref 17.9–39.5)
TIBC: 165 ug/dL — ABNORMAL LOW (ref 250–450)
UIBC: 121 ug/dL

## 2022-08-13 LAB — ABO/RH: ABO/RH(D): O POS

## 2022-08-13 LAB — COMPREHENSIVE METABOLIC PANEL
ALT: 34 U/L (ref 0–44)
AST: 29 U/L (ref 15–41)
Albumin: 2.2 g/dL — ABNORMAL LOW (ref 3.5–5.0)
Alkaline Phosphatase: 66 U/L (ref 38–126)
Anion gap: 8 (ref 5–15)
BUN: 11 mg/dL (ref 8–23)
CO2: 29 mmol/L (ref 22–32)
Calcium: 8.5 mg/dL — ABNORMAL LOW (ref 8.9–10.3)
Chloride: 101 mmol/L (ref 98–111)
Creatinine, Ser: 0.78 mg/dL (ref 0.61–1.24)
GFR, Estimated: >60 mL/min (ref >60)
Glucose, Bld: 174 mg/dL — ABNORMAL HIGH (ref 70–99)
Potassium: 3.6 mmol/L (ref 3.5–5.1)
Sodium: 138 mmol/L (ref 135–145)
Total Bilirubin: 0.8 mg/dL (ref 0.3–1.2)
Total Protein: 5.8 g/dL — ABNORMAL LOW (ref 6.5–8.1)

## 2022-08-13 LAB — PROTIME-INR
INR: 1.1 (ref 0.8–1.2)
Prothrombin Time: 14.3 seconds (ref 11.4–15.2)

## 2022-08-13 LAB — TYPE AND SCREEN
ABO/RH(D): O POS
Antibody Screen: NEGATIVE

## 2022-08-13 LAB — TROPONIN I (HIGH SENSITIVITY): Troponin I (High Sensitivity): 5 ng/L (ref <18)

## 2022-08-13 LAB — FERRITIN: Ferritin: 2066 ng/mL — ABNORMAL HIGH (ref 24–336)

## 2022-08-13 LAB — LACTIC ACID, PLASMA: Lactic Acid, Venous: 1.2 mmol/L (ref 0.5–1.9)

## 2022-08-13 LAB — PHOSPHORUS: Phosphorus: 3.8 mg/dL (ref 2.5–4.6)

## 2022-08-13 LAB — MRSA NEXT GEN BY PCR, NASAL: MRSA by PCR Next Gen: NOT DETECTED

## 2022-08-13 LAB — STREP PNEUMONIAE URINARY ANTIGEN: Strep Pneumo Urinary Antigen: NEGATIVE

## 2022-08-13 LAB — MAGNESIUM: Magnesium: 2 mg/dL (ref 1.7–2.4)

## 2022-08-13 MED ORDER — ALBUTEROL SULFATE (2.5 MG/3ML) 0.083% IN NEBU: 2.5000 mg | INHALATION_SOLUTION | RESPIRATORY_TRACT | Status: DC | PRN

## 2022-08-13 MED ORDER — BUDESONIDE 0.25 MG/2ML IN SUSP
0.2500 mg | Freq: Two times a day (BID) | RESPIRATORY_TRACT | Status: DC
  Administered 2022-08-13 – 2022-08-16 (×7): 0.25 mg via RESPIRATORY_TRACT
  Filled 2022-08-13 (×7): qty 2

## 2022-08-13 MED ORDER — LEVETIRACETAM 500 MG PO TABS
500.0000 mg | ORAL_TABLET | Freq: Two times a day (BID) | ORAL | Status: DC
  Administered 2022-08-13 – 2022-08-16 (×8): 500 mg via ORAL
  Filled 2022-08-13 (×8): qty 1

## 2022-08-13 MED ORDER — IPRATROPIUM-ALBUTEROL 0.5-2.5 (3) MG/3ML IN SOLN
3.0000 mL | Freq: Four times a day (QID) | RESPIRATORY_TRACT | Status: DC
  Administered 2022-08-13 – 2022-08-14 (×5): 3 mL via RESPIRATORY_TRACT
  Filled 2022-08-13 (×5): qty 3

## 2022-08-13 MED ORDER — MAGNESIUM SULFATE 2 GM/50ML IV SOLN
2.0000 g | Freq: Once | INTRAVENOUS | Status: AC
  Administered 2022-08-13: 2 g via INTRAVENOUS
  Filled 2022-08-13: qty 50

## 2022-08-13 MED ORDER — GUAIFENESIN ER 600 MG PO TB12
600.0000 mg | ORAL_TABLET | Freq: Two times a day (BID) | ORAL | Status: DC
  Administered 2022-08-13 – 2022-08-16 (×7): 600 mg via ORAL
  Filled 2022-08-13 (×7): qty 1

## 2022-08-13 MED ORDER — ACETAMINOPHEN 650 MG RE SUPP: 650.0000 mg | Freq: Four times a day (QID) | RECTAL | Status: DC | PRN

## 2022-08-13 MED ORDER — ADULT MULTIVITAMIN W/MINERALS CH
1.0000 | ORAL_TABLET | Freq: Every day | ORAL | Status: DC
  Administered 2022-08-13 – 2022-08-16 (×4): 1 via ORAL
  Filled 2022-08-13 (×4): qty 1

## 2022-08-13 MED ORDER — MIDODRINE HCL 5 MG PO TABS
5.0000 mg | ORAL_TABLET | Freq: Three times a day (TID) | ORAL | Status: DC
  Administered 2022-08-13 – 2022-08-15 (×7): 5 mg via ORAL
  Filled 2022-08-13 (×7): qty 1

## 2022-08-13 MED ORDER — VANCOMYCIN HCL 1250 MG/250ML IV SOLN
1250.0000 mg | Freq: Two times a day (BID) | INTRAVENOUS | Status: DC
  Administered 2022-08-13 – 2022-08-14 (×3): 1250 mg via INTRAVENOUS
  Filled 2022-08-13 (×5): qty 250

## 2022-08-13 MED ORDER — PIPERACILLIN-TAZOBACTAM 3.375 G IVPB
3.3750 g | Freq: Three times a day (TID) | INTRAVENOUS | Status: DC
  Administered 2022-08-13 – 2022-08-16 (×9): 3.375 g via INTRAVENOUS
  Filled 2022-08-13 (×12): qty 50

## 2022-08-13 MED ORDER — LACTATED RINGERS IV SOLN: INTRAVENOUS | Status: AC

## 2022-08-13 MED ORDER — ATORVASTATIN CALCIUM 40 MG PO TABS
40.0000 mg | ORAL_TABLET | Freq: Every day | ORAL | Status: DC
  Administered 2022-08-13 – 2022-08-16 (×4): 40 mg via ORAL
  Filled 2022-08-13 (×4): qty 1

## 2022-08-13 MED ORDER — LACTATED RINGERS IV BOLUS
500.0000 mL | Freq: Once | INTRAVENOUS | Status: AC
Start: 2022-08-13 — End: 2022-08-13
  Administered 2022-08-13: 500 mL via INTRAVENOUS

## 2022-08-13 MED ORDER — SODIUM CHLORIDE 0.9 % IV SOLN
2.0000 g | Freq: Three times a day (TID) | INTRAVENOUS | Status: DC
  Administered 2022-08-13: 2 g via INTRAVENOUS
  Filled 2022-08-13: qty 12.5

## 2022-08-13 MED ORDER — SODIUM CHLORIDE 0.9 % IV SOLN
500.0000 mg | INTRAVENOUS | Status: DC
  Administered 2022-08-13 – 2022-08-15 (×3): 500 mg via INTRAVENOUS
  Filled 2022-08-13 (×3): qty 5

## 2022-08-13 MED ORDER — CLOPIDOGREL BISULFATE 75 MG PO TABS
75.0000 mg | ORAL_TABLET | Freq: Every day | ORAL | Status: DC
  Administered 2022-08-13 – 2022-08-16 (×4): 75 mg via ORAL
  Filled 2022-08-13 (×4): qty 1

## 2022-08-13 MED ORDER — ACETAMINOPHEN 325 MG PO TABS: 650.0000 mg | ORAL_TABLET | Freq: Four times a day (QID) | ORAL | Status: DC | PRN

## 2022-08-13 MED ORDER — POTASSIUM CHLORIDE CRYS ER 20 MEQ PO TBCR
40.0000 meq | EXTENDED_RELEASE_TABLET | Freq: Once | ORAL | Status: AC
  Administered 2022-08-13: 40 meq via ORAL
  Filled 2022-08-13: qty 2

## 2022-08-13 MED ORDER — METHYLPREDNISOLONE SODIUM SUCC 125 MG IJ SOLR
80.0000 mg | Freq: Two times a day (BID) | INTRAMUSCULAR | Status: DC
  Administered 2022-08-13 – 2022-08-14 (×4): 80 mg via INTRAVENOUS
  Filled 2022-08-13 (×4): qty 2

## 2022-08-13 NOTE — Progress Notes (Signed)
PROGRESS NOTE        PATIENT DETAILS Name: Todd Estrada Age: 61 y.o. Sex: male Date of Birth: 29-Jun-1961 Admit Date: 08/12/2022 Admitting Physician Rhetta Mura, DO UMP:NTIRWER, Soyla Murphy, MD  Brief Summary: Patient is a 61 y.o.  male with history of CVA-residual right-sided weakness/dysarthria, chronic combined systolic/diastolic heart failure, stage IV lung CA-getting chemo/radiation-presented with shortness of breath-patient was found to have sepsis due to PNA.  Significant events: 8/25>> admit to [facility]-sepsis-immunocompromised-PNA.  Significant studies: 3/16>> Echo: EF 15-40%, grade 1 diastolic dysfunction. 8/25>> CTA chest: No PE, marked progression of airspace infiltrate in LUL with cavitation.  Significant microbiology data: 8/26>> blood culture: Pending 8/26>> sputum culture: Pending  Procedures: None  Consults: None  Subjective: Appears comfortable-blood pressure soft in the 08Q systolic range.  Very dysarthric hard to discern what he is saying.  Able to say yes/no mostly.  No family at bedside.  Objective: Vitals: Blood pressure 93/62, pulse 68, temperature 98.1 F (36.7 C), temperature source Oral, resp. rate 18, SpO2 98 %.   Exam: Gen Exam:Alert awake-not in any distress HEENT:atraumatic, normocephalic Chest: Scattered wheezing all over.  Moving air well. CVS:S1S2 regular Abdomen:soft non tender, non distended Extremities:no edema Neurology: Chronic right hemiparesis. Skin: no rash  Pertinent Labs/Radiology:    Latest Ref Rng & Units 08/12/2022   10:14 PM 08/12/2022   10:04 PM 08/08/2022   11:09 AM  CBC  WBC 4.0 - 10.5 K/uL  2.6  6.3   Hemoglobin 13.0 - 17.0 g/dL 7.1  8.0  10.8   Hematocrit 39.0 - 52.0 % 21.0  23.5  31.3   Platelets 150 - 400 K/uL  114  78     Lab Results  Component Value Date   NA 134 (L) 08/12/2022   K 3.1 (L) 08/12/2022   CL 93 (L) 08/12/2022   CO2 29 08/12/2022      Assessment/Plan: Sepsis  due to PNA: Overall appears to be better-comfortable-blood pressure still soft-continue vancomycin-we will switch to Zosyn to cover anaerobes.  Have asked nursing staff to send out sputum culture.  Add a low-dose of midodrine for BP support-continue hydration cautiously given history of CHF.  Follow cultures/clinical course and narrow down antibiotics accordingly.  Given history of dysarthria-CVA-we will get SLP eval to make sure no major aspiration issues.  COPD exacerbation: Improved-minimal wheezing-start narrowing down steroids-continue bronchodilators.  Atypical chest pain: Musculoskeletal etiology-much better compared to yesterday.  Chronic combined HFrEF/HFpEF: Volume status stable-watch closely while on IVF.  History of seizures: Continue Keppra  History of CVA with residual dysarthria/right hemiparesis: Continue Plavix/statin-needs PT/OT eval while inpatient.  HLD: Continue statin  Hypomagnesemia/hypokalemia: Repleted-awaiting a.m. labs.  History of squamous cell carcinoma of lung-undergoing concurrent chemo/radiation: Follows with Dr. Earlie Server  Pancytopenia-worsening anemia: No evidence of blood loss-suspect due to chemotherapy-watch closely and transfuse if significant drop in hemoglobin.  BMI: Estimated body mass index is 22.38 kg/m as calculated from the following:   Height as of an earlier encounter on 08/12/22: 6' (1.829 m).   Weight as of 08/07/22: 74.8 kg.   Code status:   Code Status: DNR   DVT Prophylaxis: SCDs Start: 08/13/22 0020-awaiting repeat CBC before pharmacological prophylaxis can be considered.   Family Communication: None at [employer] on 8/26.   Disposition Plan: Status is: Inpatient Remains inpatient appropriate because: Sepsis physiology from PNA-not yet  stable for discharge.   Planned Discharge Destination:Home health versus SNF when medically stable.   Diet: Diet Order             Diet regular  Room service appropriate? Yes; Fluid consistency: Thin  Diet effective now                     Antimicrobial agents: Anti-infectives (From admission, onward)    Start     Dose/Rate Route Frequency Ordered Stop   08/13/22 1800  azithromycin (ZITHROMAX) 500 mg in sodium chloride 0.9 % 250 mL IVPB        500 mg 250 mL/hr over 60 Minutes Intravenous Every 24 hours 08/13/22 0031     08/13/22 1000  vancomycin (VANCOREADY) IVPB 1250 mg/250 mL        1,250 mg 166.7 mL/hr over 90 Minutes Intravenous Every 12 hours 08/13/22 0408     08/13/22 0600  ceFEPIme (MAXIPIME) 2 g in sodium chloride 0.9 % 100 mL IVPB        2 g 200 mL/hr over 30 Minutes Intravenous Every 8 hours 08/13/22 0408     08/12/22 2330  vancomycin (VANCOCIN) IVPB 1000 mg/200 mL premix        1,000 mg 200 mL/hr over 60 Minutes Intravenous  Once 08/12/22 2318 08/13/22 0322   08/12/22 2330  cefTRIAXone (ROCEPHIN) 2 g in sodium chloride 0.9 % 100 mL IVPB        2 g 200 mL/hr over 30 Minutes Intravenous  Once 08/12/22 2318 08/13/22 0022   08/12/22 2330  azithromycin (ZITHROMAX) 500 mg in sodium chloride 0.9 % 250 mL IVPB        500 mg 250 mL/hr over 60 Minutes Intravenous  Once 08/12/22 2318 08/13/22 0124        MEDICATIONS: Scheduled Meds:  atorvastatin  40 mg Oral Daily   budesonide (PULMICORT) nebulizer solution  0.25 mg Nebulization BID   clopidogrel  75 mg Oral Daily   guaiFENesin  600 mg Oral BID   ipratropium-albuterol  3 mL Nebulization Q6H   levETIRAcetam  500 mg Oral BID   methylPREDNISolone (SOLU-MEDROL) injection  80 mg Intravenous Q12H   midodrine  5 mg Oral TID WC   multivitamin with minerals  1 tablet Oral Daily   Continuous Infusions:  azithromycin     ceFEPime (MAXIPIME) IV Stopped (08/13/22 0600)   vancomycin     PRN Meds:.acetaminophen **OR** acetaminophen, albuterol   I have personally reviewed following labs and imaging studies  LABORATORY DATA: CBC: Recent Labs  Lab 08/07/22 0455  08/08/22 1109 08/12/22 2204 08/12/22 2214  WBC 6.1 6.3 2.6*  --   NEUTROABS  --  5.6 2.0  --   HGB 10.2* 10.8* 8.0* 7.1*  HCT 30.5* 31.3* 23.5* 21.0*  MCV 90.5 87.9 90.4  --   PLT 66* 78* 114*  --     Basic Metabolic Panel: Recent Labs  Lab 08/07/22 0455 08/08/22 1028 08/12/22 2204 08/12/22 2214  NA 136 132* 134* 134*  K 3.7 3.4* 3.0* 3.1*  CL 103 99 97* 93*  CO2 26 26 29   --   GLUCOSE 120* 158* 113* 114*  BUN 18 13 17 15   CREATININE 0.90 0.78 0.85 0.70  CALCIUM 8.4* 8.9 8.1*  --   MG  --   --  1.6*  --     GFR: Estimated Creatinine Clearance: 101.3 mL/min (by C-G formula based on SCr of 0.7 mg/dL).  Liver Function Tests: Recent  Labs  Lab 08/08/22 1028 08/12/22 2204  AST 15 28  ALT 15 33  ALKPHOS 59 61  BILITOT 1.3* 0.7  PROT 6.4* 5.6*  ALBUMIN 3.4* 2.1*   No results for input(s): "LIPASE", "AMYLASE" in the last 168 hours. No results for input(s): "AMMONIA" in the last 168 hours.  Coagulation Profile: No results for input(s): "INR", "PROTIME" in the last 168 hours.  Cardiac Enzymes: No results for input(s): "CKTOTAL", "CKMB", "CKMBINDEX", "TROPONINI" in the last 168 hours.  BNP (last 3 results) No results for input(s): "PROBNP" in the last 8760 hours.  Lipid Profile: No results for input(s): "CHOL", "HDL", "LDLCALC", "TRIG", "CHOLHDL", "LDLDIRECT" in the last 72 hours.  Thyroid Function Tests: No results for input(s): "TSH", "T4TOTAL", "FREET4", "T3FREE", "THYROIDAB" in the last 72 hours.  Anemia Panel: Recent Labs    08/13/22 0137  RETICCTPCT 1.3    Urine analysis:    Component Value Date/Time   COLORURINE YELLOW 08/13/2022 0141   APPEARANCEUR CLEAR 08/13/2022 0141   LABSPEC 1.036 (H) 08/13/2022 0141   PHURINE 6.0 08/13/2022 0141   GLUCOSEU NEGATIVE 08/13/2022 0141   HGBUR NEGATIVE 08/13/2022 0141   BILIRUBINUR NEGATIVE 08/13/2022 0141   KETONESUR NEGATIVE 08/13/2022 0141   PROTEINUR NEGATIVE 08/13/2022 0141   NITRITE NEGATIVE  08/13/2022 0141   LEUKOCYTESUR NEGATIVE 08/13/2022 0141    Sepsis Labs: Lactic Acid, Venous    Component Value Date/Time   LATICACIDVEN 1.2 08/13/2022 0137    MICROBIOLOGY: Recent Results (from the past 240 hour(s))  SARS Coronavirus 2 by RT PCR (hospital order, performed in [facility] hospital lab) *cepheid single result test* Anterior Nasal Swab     Status: None   Collection Time: 08/07/22  8:07 AM   Specimen: Anterior Nasal Swab  Result Value Ref Range Status   SARS Coronavirus 2 by RT PCR NEGATIVE NEGATIVE Final    Comment: (NOTE) SARS-CoV-2 target nucleic acids are NOT DETECTED.  The SARS-CoV-2 RNA is generally detectable in upper and lower respiratory specimens during the acute phase of infection. The lowest concentration of SARS-CoV-2 viral copies this assay can detect is 250 copies / mL. A negative result does not preclude SARS-CoV-2 infection and should not be used as the sole basis for treatment or other patient management decisions.  A negative result may occur with improper specimen collection / handling, submission of specimen other than nasopharyngeal swab, presence of viral mutation(s) within the areas targeted by this assay, and inadequate number of viral copies (<250 copies / mL). A negative result must be combined with clinical observations, patient history, and epidemiological information.  Fact Sheet for Patients:   https://www.patel.info/  Fact Sheet for Healthcare Providers: https://hall.com/  This test is not yet approved or  cleared by the [place] FDA and has been authorized for detection and/or diagnosis of SARS-CoV-2 by FDA under an Emergency Use Authorization (EUA).  This EUA will remain in effect (meaning this test can be used) for the duration of the COVID-19 declaration under Section 564(b)(1) of the Act, 21 U.S.C. section 360bbb-3(b)(1), unless the authorization is terminated or revoked  sooner.  Performed at [facility], [place] [address] [place], [place] [address]   MRSA Next Gen by PCR, Nasal     Status: None   Collection Time: 08/13/22  5:04 AM   Specimen: Nasal Mucosa; Nasal Swab  Result Value Ref Range Status   MRSA by PCR Next Gen NOT DETECTED NOT DETECTED Final    Comment: (NOTE) The GeneXpert MRSA Assay (FDA  approved for NASAL specimens only), is one component of a comprehensive MRSA colonization surveillance program. It is not intended to diagnose MRSA infection nor to guide or monitor treatment for MRSA infections. Test performance is not FDA approved in patients less than 62 years old. Performed at [facility], [place] [address] [place], [place] [address]     RADIOLOGY STUDIES/RESULTS: CT Angio Chest PE W and/or Wo Contrast  Result Date: 08/12/2022 CLINICAL DATA:  Pulmonary embolism (PE) suspected, high prob. Lung cancer, dysphagia. EXAM: CT ANGIOGRAPHY CHEST WITH CONTRAST TECHNIQUE: Multidetector CT imaging of the chest was performed using the standard protocol during bolus administration of intravenous contrast. Multiplanar CT image reconstructions and MIPs were obtained to evaluate the vascular anatomy. RADIATION DOSE REDUCTION: This exam was performed according to the departmental dose-optimization program which includes automated exposure control, adjustment of the mA and/or kV according to patient size and/or use of iterative reconstruction technique. CONTRAST:  73mL OMNIPAQUE IOHEXOL 350 MG/ML SOLN COMPARISON:  08/10/2022 FINDINGS: Cardiovascular: Adequate opacification of the pulmonary arterial tree. Stable web within the posterior basal segmental pulmonary artery of the left lower lobe in keeping with chronic pulmonary embolism. No intraluminal filling defect identified to suggest acute pulmonary embolism. Central pulmonary arteries are of normal caliber. Mild multi-vessel coronary artery calcification. Global cardiac size within  normal limits. No pericardial effusion. Mild mixed atherosclerotic plaque within the thoracic aorta. No aortic aneurysm. Mediastinum/Nodes: Visualized thyroid is unremarkable. No pathologic thoracic adenopathy. Esophagus is unremarkable. Lungs/Pleura: Moderate centrilobular emphysema again identified with stable pulmonary hyperinflation. Mild left basilar dependent atelectasis, unchanged. Marked interval progression of airspace infiltrate within the left upper lobe anteriorly with areas of more focal consolidation medially demonstrating progressive parenchymal cavitation in keeping with necrotizing infection. This partially encompasses the left upper lobe mixed solid and cystic pulmonary mass, which measures 2.8 x 3.3 cm at axial image # 68/8. There is extensive asymmetric bronchial wall thickening involving the left mainstem bronchus and lobar and segmental bronchi of the left upper lobe and left lower lobe, unchanged from prior examination which may reflect changes related to neoplastic infiltration, asymmetric bronchial inflammation, or a combination of these processes. This appears stable since prior examination. Mild airway impaction within the left lower lobe. There is diffuse bronchial wall thickening again noted in keeping with airway inflammation, stable. Spiculated mixed solid and cystic mass within the posterior segment the right upper lobe measuring 2.1 x 2.8 cm at axial image # 66/8 with multiple metallic fiducial markers in place is unchanged. Sparse ground-glass infiltrate has developed within the right lower lobe which may relate to acute infection or aspiration. No pneumothorax or pleural effusion. Upper Abdomen: No acute abnormality. Musculoskeletal: No acute bone abnormality. No lytic or blastic bone lesion. Review of the MIP images confirms the above findings. IMPRESSION: 1. No acute pulmonary embolism. Stable chronic pulmonary embolism within the left lower lobe. 2. Marked interval progression  of airspace infiltrate within the left upper lobe with areas of more focal consolidation medially demonstrating progressive parenchymal cavitation in keeping with necrotizing infection. 3. Stable mixed solid and cystic pulmonary mass within the left upper lobe, partially encompassed by the above-mentioned inflammatory process. 4. Stable asymmetric bronchial wall thickening involving the left mainstem bronchus and lobar and segmental bronchi of the left upper lobe and left lower lobe which may reflect changes related to neoplastic infiltration, asymmetric bronchial inflammation, or a combination of these processes. 5. Stable spiculated mixed solid and cystic mass within the right upper lobe with fiducial  markers in place. 6. Interval development of mild ground-glass infiltrate within the right lower lobe which may relate to acute infection or aspiration. 7. Moderate centrilobular emphysema. 8. Mild multi-vessel coronary artery calcification. Aortic Atherosclerosis (ICD10-I70.0) and Emphysema (ICD10-J43.9). Electronically Signed   By: Fidela Salisbury M.D.   On: 08/12/2022 23:09   DG Chest Portable 1 View  Result Date: 08/12/2022 CLINICAL DATA:  Chest pain. EXAM: PORTABLE CHEST 1 VIEW COMPARISON:  August 07, 2022 FINDINGS: The heart size and mediastinal contours are within normal limits. A stable, ill-defined left hilar opacity is seen. A small solitary radiopaque marker is seen overlying the mid right lung. This area is partially obscured secondary to the presence of an overlying radiopaque cardiac lead. Mild linear atelectasis is seen along the periphery of the right lung base. There is mild, stable elevation of the left hemidiaphragm. No pleural effusion or pneumothorax is identified. The visualized skeletal structures are unremarkable. IMPRESSION: Stable, ill-defined left hilar opacity consistent with the patient's known biopsy-proven malignancy. Electronically Signed   By: Virgina Norfolk M.D.   On:  08/12/2022 21:42     LOS: 1 day   Oren Binet, MD  [facility]    To contact the attending provider between 7A-7P or the covering provider during after hours 7P-7A, please log into the web site [email or web address] and access using universal [place] password for that web site. If you do not have the password, please call the hospital operator.  08/13/2022, 9:20 AM

## 2022-08-13 NOTE — Sepsis Progress Note (Signed)
Following per sepsis protocol   

## 2022-08-13 NOTE — Progress Notes (Signed)
Pharmacy Antibiotic Note  Todd Estrada is a 61 y.o. male for which pharmacy has been consulted for vancomycin and zosyn dosing for  necrotizing pneumonia  .  Patient with a history of CVA w/ residual r sided weakness/dysarthria, HF, Stage IV lung cancer (receiving chemo/radiation). Patient presenting with SOB and found to have sepsis secondary to pna.  Imaging concerning for necrotizing, cavitary pneumonia. Initially started on cefepime and vancomycin but decision to change to vanc + zosyn.  SCr 0.7 WBC 2.6>2; LA 1.2; Afebrile; HR 75; RR 21>>19 MRSA PCR neg Urine Strep neg  Plan: Vancomycin 1250 mg IV q12h (eAUC 505) - per previous pharmacy abx note --SCr remains stable Zosyn 3.375g IV q8h (4 hour infusion) Trend WBC, Fever, Renal function F/u cultures, clinical course, WBC, fever De-escalate when able Levels at steady state     Temp (24hrs), Avg:98.5 F (36.9 C), Min:97.9 F (36.6 C), Max:98.9 F (37.2 C)  Recent Labs  Lab 08/07/22 0455 08/08/22 1028 08/08/22 1109 08/12/22 2204 08/12/22 2214 08/13/22 0137  WBC 6.1  --  6.3 2.6*  --   --   CREATININE 0.90 0.78  --  0.85 0.70  --   LATICACIDVEN  --   --   --   --   --  1.2    Estimated Creatinine Clearance: 101.3 mL/min (by C-G formula based on SCr of 0.7 mg/dL).    No Known Allergies  Antimicrobials this admission: vancomycin 8/25 >> zosyn 8/26 >>  cefepime 8/25 >> 8/26  Microbiology results: Pending  Thank you for allowing pharmacy to be a part of this patient's care.  Lorelei Pont, PharmD, BCPS 08/13/2022 9:55 AM ED Clinical Pharmacist -  [phone]

## 2022-08-13 NOTE — Progress Notes (Signed)
Pharmacy Antibiotic Note  Todd Estrada is a 61 y.o. male admitted on 08/12/2022 with  necrotizing pneumonia .  Pharmacy has been consulted for Vancomycin/Cefepime dosing. Pt is leukopenic. Renal function ok.   Plan: Vancomycin 1250 mg IV q12h >>>Estimated AUC: 505 Cefepime 2g IV q8h Trend WBC, temp, renal function  F/U infectious work-up Drug levels as indicated  Temp (24hrs), Avg:98.9 F (37.2 C), Min:98.9 F (37.2 C), Max:98.9 F (37.2 C)  Recent Labs  Lab 08/07/22 0455 08/08/22 1028 08/08/22 1109 08/12/22 2204 08/12/22 2214 08/13/22 0137  WBC 6.1  --  6.3 2.6*  --   --   CREATININE 0.90 0.78  --  0.85 0.70  --   LATICACIDVEN  --   --   --   --   --  1.2    Estimated Creatinine Clearance: 101.3 mL/min (by C-G formula based on SCr of 0.7 mg/dL).    No Known Allergies  Narda Bonds, PharmD, BCPS Clinical Pharmacist Phone: [phone]

## 2022-08-13 NOTE — ED Notes (Signed)
Breakfast order placed ?

## 2022-08-13 NOTE — ED Notes (Signed)
This RN only able to obtain 1 set of 2nd blood culture set.

## 2022-08-13 NOTE — H&P (Signed)
History and Physical    PLEASE NOTE THAT [employer]AGON DICT[employer] SOFTWARE WAS USED IN THE CONSTRUCTION OF THIS NOTE.   Todd Estrada CVE:[id number] DOB: 1961-05-25 DOA: 08/12/2022  PCP: [name], MD  Patient coming from: home   I have personally briefly reviewed patient's old medical records in [place]  Chief Complaint: Shortness of breath  HPI: Todd Estrada is a 61 y.o. male with medical history significant for stage IV lung cancer on chemotherapy/radiation, ischemic stroke with residual expressive aphasia, chronic systolic/diastolic heart failure, COPD, seizures, who is admitted to [facility] on 08/12/2022 with sepsis due to severe multifocal pneumonia after presenting from home to [facility] ED complaining of shortness of breath.   The patient reports 4 to 5 days of progressive shortness of breath associated with worsening nonproductive cough in the absence of hemoptysis.  He also notes associated bilateral anterior chest wall discomfort that occurs with coughing.  This is nonexertional, nonpositional, and is not reproducible after palpation of the anterior chest wall.  Has not taken any nitroglycerin for this discomfort.  Denies any associated palpitations, diaphoresis, dizziness, presyncope, syncope.  He also notes recent subjective fever in the absence of chills, full body rigors, or generalized myalgias.  No recent orthopnea, PND, or worsening of peripheral edema.  No recent rhinitis, rhinorrhea, sore throat, wheezing.  No headache or neck stiffness.  Denies any recent abdominal discomfort, nausea, vomiting, diarrhea, melena, hematochezia, or rash.  No recent dysuria or gross hematuria.  Has stage IV lung cancer, for which she is undergoing chemotherapy and radiation, per chart review, most recent chemotherapy occurred on 08/08/2022, while most recent radiation occurred earlier today.  He also has a documented history of COPD in the setting of being a former smoker prolonged  history of cigarette smoking.  Denies any known baseline supplemental oxygen requirements.  Medical history also notable for chronic systolic/diastolic heart failure, with most recent echocardiogram in March 2023 notable for LVEF 40 to 45%, no focal wall motion abnormalities, grade 1 diastolic dysfunction, normal right ventricular systolic function, and trivial mitral regurgitation.  Not on a scheduled diuretic medications at home.  Per chart review, hemoglobin data points notable for the following: 13.3 on 07/25/2022, 2.8 on 08/01/2022, 2.2 on 08/07/2022, and 10.8 on 08/08/2022.  Additionally, most recent prior will with cell count was 6.3 on 08/08/2022.     ED Course:  Vital signs in the ED were notable for the following: Afebrile; initial heart rate 96, subsequently decreasing 78 following interval IV fluids, as further detailed below; initial blood pressure 81/59, which is increased to 99/65 following 500 cc LR bolus; respiratory rate 15-24, oxygen saturation 95 to 100% on room air.  Labs were notable for the following: CMP notable for the following: Potassium 3.0, bicarbonate 29, creatinine 0.5, albumin 2.1, otherwise liver enzymes within normal limits.  Serum magnesium of 1.6.  High-sensitivity troponin I initially noted to be 6, with repeat value trending down to 5.  CBC notable for lipid cell count 2600 with 76% neutrophils and absolute neutrophil count of 2000, hemoglobin 8.0 associated with normocytic/normochromic findings as well as nonelevated RDW, platelet count 114.  Blood cultures x2 collected prior to initiation of IV antibiotics.  Imaging and additional notable ED work-up: EKG, comparison to most recent prior from May 2023 shows sinus rhythm with PAC, rate 75, normal intervals, nonspecific T wave inversion in aVL, V1, V2, all of which appear unchanged relative to most recent prior EKG, unchanged Q wave in aVL,  and no evidence of ST changes, including no evidence of ST elevation.  CTA  chest, in comparison to CTA chest that was performed in May 2023, shows no evidence of acute pulmonary embolism, while showing interval worsening of airspace infiltrate in the left upper lobe, concerning for potential necrotizing infection as well as potential cavitation, while also showing stable, asymmetric bronchial wall thickening involving the left mainstem bronchus and lobar and segmental branch of the left upper lobe and left lower lobe, which may reflect changes related to neoplastic and full titration or asymmetric chronic inflammation; CTA chest today also shows stable spiculated mass in the right upper lobe in addition to interval development of mild groundglass infiltrate in the right lower lobe, concerning for acute infection.  No evidence of edema, pleural effusion, or pneumothorax.  While in the ED, the following were administered: Azithromycin, Rocephin, IV vancomycin, lactated Ringer's x500 cc bolus.  Subsequently, the patient was admitted sepsis due to severe multifocal pneumonia resulting in acute COPD exacerbation, with presenting labs also notable for acute on subacute anemia, hypokalemia, and hypomagnesemia.    Review of Systems: As per HPI otherwise 10 point review of systems negative.   Past Medical History:  Diagnosis Date   Asthma    COPD (chronic obstructive pulmonary disease) (HCC)    GERD (gastroesophageal reflux disease)    History of tracheostomy    03/09/22-04/11/22   HLD (hyperlipidemia)    Hypertension    Lung cancer (HCC)    PAD (peripheral artery disease) ([place])    Seizures ([place]) 06/02/2022   Stroke ([place]) 02/2022    Past Surgical History:  Procedure Laterality Date   BRONCHIAL BIOPSY  05/30/2022   Procedure: BRONCHIAL BIOPSIES;  Surgeon: Collene Gobble, MD;  Location: [facility] ENDOSCOPY;  Service: Pulmonary;;   BRONCHIAL BRUSHINGS  05/30/2022   Procedure: BRONCHIAL BRUSHINGS;  Surgeon: Collene Gobble, MD;  Location: [facility] ENDOSCOPY;  Service: Pulmonary;;    BRONCHIAL NEEDLE ASPIRATION BIOPSY  05/30/2022   Procedure: BRONCHIAL NEEDLE ASPIRATION BIOPSIES;  Surgeon: Collene Gobble, MD;  Location: [facility] ENDOSCOPY;  Service: Pulmonary;;   BRONCHIAL WASHINGS  05/30/2022   Procedure: BRONCHIAL WASHINGS;  Surgeon: Collene Gobble, MD;  Location: [facility] ENDOSCOPY;  Service: Pulmonary;;   ESOPHAGOGASTRODUODENOSCOPY (EGD) WITH PROPOFOL N/A 03/11/2022   Procedure: ESOPHAGOGASTRODUODENOSCOPY (EGD) WITH PROPOFOL;  Surgeon: Georganna Skeans, MD;  Location: [place];  Service: General;  Laterality: N/A;   FIDUCIAL MARKER PLACEMENT  05/30/2022   Procedure: FIDUCIAL MARKER PLACEMENT;  Surgeon: Collene Gobble, MD;  Location: [facility] ENDOSCOPY;  Service: Pulmonary;;   IR ANGIO INTRA EXTRACRAN SEL COM CAROTID INNOMINATE UNI R MOD SED  03/02/2022   IR CT HEAD LTD  03/02/2022   IR PERCUTANEOUS ART THROMBECTOMY/INFUSION INTRACRANIAL INC DIAG ANGIO  03/02/2022   PEG PLACEMENT N/A 03/11/2022   Procedure: PERCUTANEOUS ENDOSCOPIC GASTROSTOMY (PEG) PLACEMENT;  Surgeon: Georganna Skeans, MD;  Location: [place];  Service: General;  Laterality: N/A;   RADIOLOGY WITH ANESTHESIA N/A 03/02/2022   Procedure: IR WITH ANESTHESIA;  Surgeon: Luanne Bras, MD;  Location: [place];  Service: Radiology;  Laterality: N/A;   VIDEO BRONCHOSCOPY WITH RADIAL ENDOBRONCHIAL ULTRASOUND  05/30/2022   Procedure: VIDEO BRONCHOSCOPY WITH RADIAL ENDOBRONCHIAL ULTRASOUND;  Surgeon: Collene Gobble, MD;  Location: [place] ENDOSCOPY;  Service: Pulmonary;;    Social History:  reports that he quit smoking about 3 months ago. His smoking use included cigarettes. He has never been exposed to tobacco smoke. He has never used smokeless tobacco. He reports that  he does not currently use alcohol. He reports that he does not use drugs.   No Known Allergies  Family History  Problem Relation Age of Onset   Throat cancer Mother    Liver cancer Father    Kidney failure Sister    Cancer - Lung Paternal Uncle     Family  history reviewed and not pertinent    Prior to Admission medications   Medication Sig Start Date End Date Taking? Authorizing Provider  atorvastatin (LIPITOR) 40 MG tablet Take 1 tablet (40 mg total) by mouth daily. 05/25/22 05/25/23  Lacinda Axon, MD  budesonide (PULMICORT) 0.5 MG/2ML nebulizer solution USE 2 ML(0.5 MG) VIA NEBULIZER TWICE DAILY Patient taking differently: Take 0.5 mg by nebulization daily. 06/27/22   Freddi Starr, MD  clopidogrel (PLAVIX) 75 MG tablet Take 1 tablet (75 mg total) by mouth daily. Okay to restart this medication on 05/31/2022 Patient taking differently: Take 75 mg by mouth daily. 06/28/22   Gaylan Gerold, DO  folic acid (FOLVITE) 1 MG tablet Take 1 tablet (1 mg total) by mouth daily. 05/09/22   Maudie Mercury, MD  levETIRAcetam (KEPPRA) 500 MG tablet Take 1 tablet (500 mg total) by mouth 2 (two) times daily. 06/02/22   Marcial Pacas, MD  Multiple Vitamin (MULTIVITAMIN WITH MINERALS) TABS tablet Take 1 tablet by mouth daily. 05/19/22   Maudie Mercury, MD  nicotine (NICODERM CQ - DOSED IN MG/24 HOURS) 14 mg/24hr patch Place 1 patch (14 mg total) onto the skin daily. 07/22/22   Atway, Rayann N, DO  ondansetron (ZOFRAN) 4 MG tablet Take 4 mg by mouth every 8 (eight) hours as needed for nausea or vomiting.    [provider]  prochlorperazine (COMPAZINE) 10 MG tablet Take 1 tablet (10 mg total) by mouth every 6 (six) hours as needed for nausea or vomiting. 07/01/22   Curt Bears, MD  STIOLTO RESPIMAT 2.5-2.5 MCG/ACT AERS INHALE 2 PUFFS INTO THE LUNGS DAILY 06/17/22   Freddi Starr, MD     Objective    Physical Exam: Vitals:   08/12/22 2330 08/13/22 0000 08/13/22 0003 08/13/22 0015  BP: 99/65 (!) 88/54  (!) 103/55  Pulse: 74 78  72  Resp: (!) 21 19  15   Temp:   98.9 F (37.2 C)   TempSrc:   Oral   SpO2: 96% 95%  100%    General: appears to be stated age; alert; expressive aphasia noted, although the patient is able to clearly say yes or no,  appropriately, to questions posed to him; mildly increased work of breathing noted Skin: warm, dry, no rash Head:  AT/[place] Mouth:  Oral mucosa membranes appear dry, normal dentition Neck: supple; trachea midline Heart:  RRR; did not appreciate any M/R/G Lungs: CTAB, did not appreciate any wheezes, rales, or rhonchi Abdomen: + BS; soft, ND, NT Vascular: 2+ pedal pulses b/l; 2+ radial pulses b/l Extremities: no peripheral edema, no muscle wasting Neuro: strength and sensation intact in upper and lower extremities b/l    Labs on Admission: I have personally reviewed following labs and imaging studies  CBC: Recent Labs  Lab 08/07/22 0455 08/08/22 1109 08/12/22 2204 08/12/22 2214  WBC 6.1 6.3 2.6*  --   NEUTROABS  --  5.6 2.0  --   HGB 10.2* 10.8* 8.0* 7.1*  HCT 30.5* 31.3* 23.5* 21.0*  MCV 90.5 87.9 90.4  --   PLT 66* 78* 114*  --    Basic Metabolic Panel: Recent Labs  Lab  08/07/22 0455 08/08/22 1028 08/12/22 2204 08/12/22 2214  NA 136 132* 134* 134*  K 3.7 3.4* 3.0* 3.1*  CL 103 99 97* 93*  CO2 26 26 29   --   GLUCOSE 120* 158* 113* 114*  BUN 18 13 17 15   CREATININE 0.90 0.78 0.85 0.70  CALCIUM 8.4* 8.9 8.1*  --   MG  --   --  1.6*  --    GFR: Estimated Creatinine Clearance: 101.3 mL/min (by C-G formula based on SCr of 0.7 mg/dL). Liver Function Tests: Recent Labs  Lab 08/08/22 1028 08/12/22 2204  AST 15 28  ALT 15 33  ALKPHOS 59 61  BILITOT 1.3* 0.7  PROT 6.4* 5.6*  ALBUMIN 3.4* 2.1*   No results for input(s): "LIPASE", "AMYLASE" in the last 168 hours. No results for input(s): "AMMONIA" in the last 168 hours. Coagulation Profile: No results for input(s): "INR", "PROTIME" in the last 168 hours. Cardiac Enzymes: No results for input(s): "CKTOTAL", "CKMB", "CKMBINDEX", "TROPONINI" in the last 168 hours. BNP (last 3 results) No results for input(s): "PROBNP" in the last 8760 hours. HbA1C: No results for input(s): "HGBA1C" in the last 72 hours. CBG: No  results for input(s): "GLUCAP" in the last 168 hours. Lipid Profile: No results for input(s): "CHOL", "HDL", "LDLCALC", "TRIG", "CHOLHDL", "LDLDIRECT" in the last 72 hours. Thyroid Function Tests: No results for input(s): "TSH", "T4TOTAL", "FREET4", "T3FREE", "THYROIDAB" in the last 72 hours. Anemia Panel: No results for input(s): "VITAMINB12", "FOLATE", "FERRITIN", "TIBC", "IRON", "RETICCTPCT" in the last 72 hours. Urine analysis:    Component Value Date/Time   COLORURINE YELLOW 05/06/2022 0630   APPEARANCEUR CLOUDY (A) 05/06/2022 0630   LABSPEC 1.028 05/06/2022 0630   PHURINE 5.0 05/06/2022 0630   GLUCOSEU NEGATIVE 05/06/2022 0630   HGBUR SMALL (A) 05/06/2022 0630   BILIRUBINUR NEGATIVE 05/06/2022 0630   KETONESUR NEGATIVE 05/06/2022 0630   PROTEINUR 30 (A) 05/06/2022 0630   NITRITE NEGATIVE 05/06/2022 0630   LEUKOCYTESUR LARGE (A) 05/06/2022 0630    Radiological Exams on Admission: CT Angio Chest PE W and/or Wo Contrast  Result Date: 08/12/2022 CLINICAL DATA:  Pulmonary embolism (PE) suspected, high prob. Lung cancer, dysphagia. EXAM: CT ANGIOGRAPHY CHEST WITH CONTRAST TECHNIQUE: Multidetector CT imaging of the chest was performed using the standard protocol during bolus administration of intravenous contrast. Multiplanar CT image reconstructions and MIPs were obtained to evaluate the vascular anatomy. RADIATION DOSE REDUCTION: This exam was performed according to the departmental dose-optimization program which includes automated exposure control, adjustment of the mA and/or kV according to patient size and/or use of iterative reconstruction technique. CONTRAST:  40m OMNIPAQUE IOHEXOL 350 MG/ML SOLN COMPARISON:  08/10/2022 FINDINGS: Cardiovascular: Adequate opacification of the pulmonary arterial tree. Stable web within the posterior basal segmental pulmonary artery of the left lower lobe in keeping with chronic pulmonary embolism. No intraluminal filling defect identified to suggest  acute pulmonary embolism. Central pulmonary arteries are of normal caliber. Mild multi-vessel coronary artery calcification. Global cardiac size within normal limits. No pericardial effusion. Mild mixed atherosclerotic plaque within the thoracic aorta. No aortic aneurysm. Mediastinum/Nodes: Visualized thyroid is unremarkable. No pathologic thoracic adenopathy. Esophagus is unremarkable. Lungs/Pleura: Moderate centrilobular emphysema again identified with stable pulmonary hyperinflation. Mild left basilar dependent atelectasis, unchanged. Marked interval progression of airspace infiltrate within the left upper lobe anteriorly with areas of more focal consolidation medially demonstrating progressive parenchymal cavitation in keeping with necrotizing infection. This partially encompasses the left upper lobe mixed solid and cystic pulmonary mass, which measures 2.8 x  3.3 cm at axial image # 68/8. There is extensive asymmetric bronchial wall thickening involving the left mainstem bronchus and lobar and segmental bronchi of the left upper lobe and left lower lobe, unchanged from prior examination which may reflect changes related to neoplastic infiltration, asymmetric bronchial inflammation, or a combination of these processes. This appears stable since prior examination. Mild airway impaction within the left lower lobe. There is diffuse bronchial wall thickening again noted in keeping with airway inflammation, stable. Spiculated mixed solid and cystic mass within the posterior segment the right upper lobe measuring 2.1 x 2.8 cm at axial image # 66/8 with multiple metallic fiducial markers in place is unchanged. Sparse ground-glass infiltrate has developed within the right lower lobe which may relate to acute infection or aspiration. No pneumothorax or pleural effusion. Upper Abdomen: No acute abnormality. Musculoskeletal: No acute bone abnormality. No lytic or blastic bone lesion. Review of the MIP images confirms the  above findings. IMPRESSION: 1. No acute pulmonary embolism. Stable chronic pulmonary embolism within the left lower lobe. 2. Marked interval progression of airspace infiltrate within the left upper lobe with areas of more focal consolidation medially demonstrating progressive parenchymal cavitation in keeping with necrotizing infection. 3. Stable mixed solid and cystic pulmonary mass within the left upper lobe, partially encompassed by the above-mentioned inflammatory process. 4. Stable asymmetric bronchial wall thickening involving the left mainstem bronchus and lobar and segmental bronchi of the left upper lobe and left lower lobe which may reflect changes related to neoplastic infiltration, asymmetric bronchial inflammation, or a combination of these processes. 5. Stable spiculated mixed solid and cystic mass within the right upper lobe with fiducial markers in place. 6. Interval development of mild ground-glass infiltrate within the right lower lobe which may relate to acute infection or aspiration. 7. Moderate centrilobular emphysema. 8. Mild multi-vessel coronary artery calcification. Aortic Atherosclerosis (ICD10-I70.0) and Emphysema (ICD10-J43.9). Electronically Signed   By: Fidela Salisbury M.D.   On: 08/12/2022 23:09   DG Chest Portable 1 View  Result Date: 08/12/2022 CLINICAL DATA:  Chest pain. EXAM: PORTABLE CHEST 1 VIEW COMPARISON:  August 07, 2022 FINDINGS: The heart size and mediastinal contours are within normal limits. A stable, ill-defined left hilar opacity is seen. A small solitary radiopaque marker is seen overlying the mid right lung. This area is partially obscured secondary to the presence of an overlying radiopaque cardiac lead. Mild linear atelectasis is seen along the periphery of the right lung base. There is mild, stable elevation of the left hemidiaphragm. No pleural effusion or pneumothorax is identified. The visualized skeletal structures are unremarkable. IMPRESSION: Stable,  ill-defined left hilar opacity consistent with the patient's known biopsy-proven malignancy. Electronically Signed   By: Virgina Norfolk M.D.   On: 08/12/2022 21:42     EKG: Independently reviewed, with result as described above.    Assessment/Plan   Principal Problem:   Multifocal pneumonia Active Problems:   Dyslipidemia   Sepsis ([place])   Atypical chest pain   Acute on chronic anemia   Hypokalemia   Hypomagnesemia   COPD with acute exacerbation (HCC)   Chronic combined systolic and diastolic heart failure ([place])   History of seizures      #) Sepsis due to severe multifocal pneumonia: In the setting of 4 to 5 days of progressive shortness of breath associate with nonproductive cough, subjective fever, updated CTA chest shows worsening of left upper lobe infiltrates identified on 820, as well as interval development of right lower lobe infiltrates, concerning for  acute infection.  There is also question of potential necrotizing aspect of the patient's pneumonia as well as potential cavitation, which may warrant pulmonology consult during the day.   Patient's pneumonia meets criteria to be considered severe in nature in the context of multilobular infiltrates, leukopenia, and presenting hypotension requiring IV fluid resuscitation.  In the setting of severe pneumonia as well as the patient's underlying immunocompromised status while on chemotherapy with resultant presenting leukopenia, will provide broad-spectrum IV antibiotics, including antipseudomonal coverage as well as continuation of IV vancomycin for MRSA coverage.  We will also continue the azithromycin initiated in the ED this evening, for atypical coverage given the multilobular nature of his pneumonia with additional anti-inflammatory benefit given the nature of his presentation/radiographic findings.  SIRS criteria met via leukopenia, tachycardia, tachypnea.  Stat lactic acid level ordered, with result currently pending.  Of  note, in the current absence of any evidence of end organ damage pt's sepsis does not meet criteria to be considered severe in nature. Also, in the absence of LA level greater than or equal to 4.0, and in the absence of any associated hypotension refractory to IVF's, there are no indications for administration of a 30 mL/kg IVF bolus at this time.  Will provide additional IV fluids, as indicated below, while exercising some caution with this given his history of chronic combined systolic/diastolic heart failure.  Additional ED work-up/management notable for: Blood cultures x2 collected in the ED today prior to initiation of IV antibiotics. No e/o additional infectious process at this time, but will also check urinalysis to further assess.   Plan: CBC w/ diff and CMP in AM. Follow for results of blood cx's x 2. Abx: IV vancomycin, cefepime, azithromycin, as above.  Lactated Ringer's times additional 500 cc bolus followed by initiation of continuous LR at 100 cc/h x 8 hours.  Stat lactic acid.  Flutter valve.  Incentive [place].  Check MRSA PCR.  Check strep pneumoniae urine antigen, procalcitonin level.  Scheduled Mucinex ordered.  Check urinalysis.          #) Atypical chest pain: Bilateral anterior chest wall discomfort that occurs with coughing.  Nonexertional.  Appears consistent with atypical, noncardiac chest discomfort, with likely musculoskeletal component given increased frequency of coughing in the setting of underlying lung cancer as well as multifocal pneumonia.  ACS less likely, given the absence of typical features, while high-sensitivity troponin I x2 are not elevated and trending down, while EKG shows no evidence of acute ischemic changes, including no evidence of ST elevation.  Plan: Prn acetaminophen and for discomfort.  Scheduled Mucinex ordered.  Flutter valve.  Incentive spirometry.  Further evaluation management of sepsis due to multifocal pneumonia, as above.        #)  Acute on subacute anemia: In the context of undergoing chemotherapy for his lung cancer, it appears that the patient has developed new onset anemia over the last several weeks, as further quantified trended above.  Today's hemoglobin shows further interval trend down relative to the most recent prior value that was checked on day of most recent prior chemotherapy session, as above, corresponding with interval decline in blood cell count over same timeframe..  No evidence of acute bleed, including DRE performed by EDP that showed brown-colored stool.  Plan: Add on iron studies.  Check INR.  Given metastatic cancer, also check reticulocyte count.  Type and screen ordered.  Repeat CBC in the morning.       #) Hypokalemia: Presenting serum potassium  level 3.0.  Plan: Potassium chloride 40 mill equivalents p.o. x1 dose now.  Supplementation hypomagnesemic finding, as below.  Repeat CMP in the morning.  Monitor on symmetry.         #) Hypomagnesemia: Presenting serum magnesium level 1.6.   Plan: Magnesium sulfate 2 g IV over 2 hours x 1 dose now.  Repeat serum magnesium level in the morning.  Monitor on telemetry.         #) Acute COPD exacerbation: in the context of a documented history of COPD, presentation appears consistent with acute COPD exacerbation as a consequence of presenting multifocal pneumonia superimposed on stage IV lung cancer.  Additionally, given evidence of bronchial inflammation as well as new onset groundglass opacities identified on today CT chest, will initiate systemic corticosteroids, as further detailed below.  Of note, home respiratory regimen consists of pulmicort and Stiolto respimat.  Is a former smoker, as further detailed above.   Plan: monitor continuous pulse oxymetry. Monitor on telemetry.  Further evaluation management of sepsis due to severe multifocal pneumonia, as above.  Solumedrol. Scheduled duonebs q6 hours. Prn albuterol inhaler.  CMP in the  morning. Repeat CBC in the morning.  Magnesium supplementation, as above.  Check serum Phos level.  Check blood gas. Add-on procalcitonin.  Holding home respiratory medications for now.            #) Chronic combined systolic and diastolic heart failure: documented history of such, with most recent echocardiogram performed in March 2023 notable for LVEF 40 to 45% as well as grade 1 diastolic dysfunction, with additional details as conveyed above.  No clinical will radiographic evidence to suggest acutely decompensated heart failure at this time.  Patient's home diuretic regimen reportedly consists of the following: None.   Plan: monitor strict I's & O's and daily weights. Repeat BMP in the morning.  Further evaluation management of presenting hypomagnesemia, and hypokalemia, as above.  Add on BNP.            #) Hyperlipidemia: documented h/o such. On high intensity atorvastatin as outpatient.    Plan: continue home statin.            #) History of seizures: Documented history of such, on Keppra as an outpatient.  No clinical evidence of active seizures at this time.  Plan: Resume home Keppra.       DVT prophylaxis: SCD's   Code Status: DNR (per my discussions with the patient this evening, he conveys that he wishes to be DNR).  Family Communication: none Disposition Plan: Per Rounding Team Consults called: none;  Admission status: inpatient    PLEASE NOTE THAT DRAGON DICTATION SOFTWARE WAS USED IN THE CONSTRUCTION OF THIS NOTE.   [place] DO [facility]  From [place]   08/13/2022, 1:21 AM

## 2022-08-13 NOTE — Plan of Care (Signed)

## 2022-08-13 NOTE — ED Notes (Signed)
This RN updated pt's relative [name]) about pt's care.

## 2022-08-14 DIAGNOSIS — J189 Pneumonia, unspecified organism: Secondary | ICD-10-CM | POA: Diagnosis not present

## 2022-08-14 LAB — COMPREHENSIVE METABOLIC PANEL
ALT: 28 U/L (ref 0–44)
AST: 20 U/L (ref 15–41)
Albumin: 2.1 g/dL — ABNORMAL LOW (ref 3.5–5.0)
Alkaline Phosphatase: 63 U/L (ref 38–126)
Anion gap: 11 (ref 5–15)
BUN: 14 mg/dL (ref 8–23)
CO2: 27 mmol/L (ref 22–32)
Calcium: 8.6 mg/dL — ABNORMAL LOW (ref 8.9–10.3)
Chloride: 103 mmol/L (ref 98–111)
Creatinine, Ser: 0.86 mg/dL (ref 0.61–1.24)
GFR, Estimated: >60 mL/min (ref >60)
Glucose, Bld: 162 mg/dL — ABNORMAL HIGH (ref 70–99)
Potassium: 3.7 mmol/L (ref 3.5–5.1)
Sodium: 141 mmol/L (ref 135–145)
Total Bilirubin: 0.7 mg/dL (ref 0.3–1.2)
Total Protein: 5.5 g/dL — ABNORMAL LOW (ref 6.5–8.1)

## 2022-08-14 LAB — CBC
HCT: 25.9 % — ABNORMAL LOW (ref 39.0–52.0)
Hemoglobin: 8.7 g/dL — ABNORMAL LOW (ref 13.0–17.0)
MCH: 30.6 pg (ref 26.0–34.0)
MCHC: 33.6 g/dL (ref 30.0–36.0)
MCV: 91.2 fL (ref 80.0–100.0)
Platelets: 129 10*3/uL — ABNORMAL LOW (ref 150–400)
RBC: 2.84 MIL/uL — ABNORMAL LOW (ref 4.22–5.81)
RDW: 14.1 % (ref 11.5–15.5)
WBC: 2.5 10*3/uL — ABNORMAL LOW (ref 4.0–10.5)
nRBC: 0 % (ref 0.0–0.2)

## 2022-08-14 LAB — MAGNESIUM: Magnesium: 2 mg/dL (ref 1.7–2.4)

## 2022-08-14 MED ORDER — PANTOPRAZOLE SODIUM 40 MG PO TBEC
40.0000 mg | DELAYED_RELEASE_TABLET | Freq: Every day | ORAL | Status: DC
  Filled 2022-08-14: qty 1

## 2022-08-14 MED ORDER — PREDNISONE 20 MG PO TABS
60.0000 mg | ORAL_TABLET | Freq: Every day | ORAL | Status: DC
  Administered 2022-08-15 – 2022-08-16 (×2): 60 mg via ORAL
  Filled 2022-08-14 (×3): qty 3

## 2022-08-14 MED ORDER — PANTOPRAZOLE 2 MG/ML SUSPENSION
40.0000 mg | Freq: Every day | ORAL | Status: DC
  Administered 2022-08-14 – 2022-08-15 (×2): 40 mg via ORAL
  Filled 2022-08-14 (×2): qty 20

## 2022-08-14 MED ORDER — LACTATED RINGERS IV SOLN: INTRAVENOUS | Status: DC

## 2022-08-14 NOTE — Evaluation (Signed)
Physical Therapy Evaluation Patient Details Name: Todd Estrada MRN: [id number] DOB: 03-16-1961 Today's Date: 08/14/2022  History of Present Illness  61 y.o.  male with history of CVA-residual right-sided weakness/dysarthria, chronic combined systolic/diastolic heart failure, stage IV lung CA-getting chemo/radiation-presented with shortness of breath-patient was found to have sepsis due to PNA. Admitted 8/25  Clinical Impression  Pt admitted with above diagnosis. Limited hx provided due to impaired communication. Patient required min assist for transfer and short distance ambulation. (Unable to determine what, if any AD he used and PLOF prior to this admission.) Suspect pt will continue to improve and benefit from HHPT but will need to ensure he will have some assistance available at home upon d/c, which was again difficult to confirm today. If no assistance available at home, may need to consider short term SNF. Will update as information becomes available. Pt currently with functional limitations due to the deficits listed below (see PT Problem List). Pt will benefit from skilled PT to increase their independence and safety with mobility to allow discharge to the venue listed below.          Recommendations for follow up therapy are one component of a multi-disciplinary discharge planning process, led by the attending physician.  Recommendations may be updated based on patient status, additional functional criteria and insurance authorization.  Follow Up Recommendations Home health PT (Anticipate patient will continue to improve prior to d/c. Will update recs as appropriate.)      Assistance Recommended at Discharge Intermittent Supervision/Assistance  Patient can return home with the following  A little help with walking and/or transfers;A little help with bathing/dressing/bathroom;Assistance with cooking/housework;Direct supervision/assist for medications management;Direct supervision/assist  for financial management;Assist for transportation;Help with stairs or ramp for entrance    Equipment Recommendations None recommended by PT  Recommendations for Other Services       Functional Status Assessment Patient has had a recent decline in their functional status and demonstrates the ability to make significant improvements in function in a reasonable and predictable amount of time.     Precautions / Restrictions Precautions Precautions: Fall Required Braces or Orthoses: Other Brace Other Brace: AFO RLE Restrictions Weight Bearing Restrictions: No      Mobility  Bed Mobility Overal bed mobility: Modified Independent             General bed mobility comments: extra time    Transfers Overall transfer level: Needs assistance Equipment used: Rolling walker (2 wheels) Transfers: Sit to/from Stand Sit to Stand: Min assist           General transfer comment: Min assist for stability upon standing, good power up but utilized rocking momentum to rise (low bed setting and pt fairly tall.)    Ambulation/Gait Ambulation/Gait assistance: Herbalist (Feet): 25 Feet Assistive device: Rolling walker (2 wheels), IV Pole Gait Pattern/deviations: Step-to pattern, Decreased stance time - right, Decreased step length - right, Decreased step length - left, Decreased dorsiflexion - right, Decreased weight shift to right, Shuffle Gait velocity: slow Gait velocity interpretation: <1.31 ft/sec, indicative of household ambulator Pre-gait activities: Standing weight shifts General Gait Details: Hemiparetic gait, AFO worn but still dragging bottom of Rt foot. Initially with RW and min assist to hold with RUE (needs trough.) Tried IV pole, pt required min assist for balance, and cues to keep pole closer to proximity for upright posture. No overt buckling, however becomes unstable if no device in LUE to assist with stability.  Stairs  Wheelchair  Mobility    Modified Rankin (Stroke Patients Only)       Balance Overall balance assessment: Needs assistance Sitting-balance support: No upper extremity supported, Feet supported Sitting balance-Leahy Scale: Good     Standing balance support: No upper extremity supported, During functional activity Standing balance-Leahy Scale: Fair                               Pertinent Vitals/Pain Pain Assessment Pain Assessment: No/denies pain    Home Living Family/patient expects to be discharged to:: Private residence Living Arrangements:  (family member) Available Help at Discharge: Family Type of Home: House Home Access: Stairs to enter   Technical brewer of Steps: 2   Home Layout: One level Home Equipment: Conservation officer, nature (2 wheels);Cane - single point;BSC/3in1;Wheelchair - manual Additional Comments: Majority of information taken from previous notes 2/2 communication difficulties.    Prior Function Prior Level of Function : Independent/Modified Independent             Mobility Comments: Unable to state ADLs Comments: Unable to state     Hand Dominance   Dominant Hand: Right (From prior note)    Extremity/Trunk Assessment   Upper Extremity Assessment Upper Extremity Assessment: Defer to OT evaluation    Lower Extremity Assessment Lower Extremity Assessment: Generalized weakness;RLE deficits/detail RLE Deficits / Details: Distal weakness > proximal in RLE, wears AFO which benefited pt greatly while ambulating.       Communication   Communication: Expressive difficulties  Cognition Arousal/Alertness: Awake/alert Behavior During Therapy: WFL for tasks assessed/performed Overall Cognitive Status: Difficult to assess                                 General Comments: Following motor commands, aware of deficits with gait, and safety.        General Comments General comments (skin integrity, edema, etc.): VSS on RA     Exercises     Assessment/Plan    PT Assessment Patient needs continued PT services  PT Problem List Decreased strength;Decreased range of motion;Decreased activity tolerance;Decreased balance;Decreased mobility;Decreased coordination;Decreased knowledge of use of DME;Impaired tone       PT Treatment Interventions DME instruction;Gait training;Stair training;Functional mobility training;Therapeutic activities;Therapeutic exercise;Balance training;Neuromuscular re-education;Patient/family education    PT Goals (Current goals can be found in the Care Plan section)  Acute Rehab PT Goals Patient Stated Goal: none stated PT Goal Formulation: With patient Time For Goal Achievement: 08/21/22 Potential to Achieve Goals: Good    Frequency Min 3X/week     Co-evaluation               AM-PAC PT "6 Clicks" Mobility  Outcome Measure Help needed turning from your back to your side while in a flat bed without using bedrails?: None Help needed moving from lying on your back to sitting on the side of a flat bed without using bedrails?: None Help needed moving to and from a bed to a chair (including a wheelchair)?: A Little Help needed standing up from a chair using your arms (e.g., wheelchair or bedside chair)?: A Little Help needed to walk in hospital room?: A Little Help needed climbing 3-5 steps with a railing? : A Lot 6 Click Score: 19    End of Session Equipment Utilized During Treatment: Gait belt;Other (comment) (Rt AFO) Activity Tolerance: Patient tolerated treatment well Patient left: in chair;with call  bell/phone within reach;with chair alarm set;with nursing/sitter in room Nurse Communication: Mobility status PT Visit Diagnosis: Unsteadiness on feet (R26.81);Other abnormalities of gait and mobility (R26.89);Muscle weakness (generalized) (M62.81);Difficulty in walking, not elsewhere classified (R26.2);Hemiplegia and hemiparesis Hemiplegia - Right/Left: Right Hemiplegia -  dominant/non-dominant: Dominant Hemiplegia - caused by:  (Prior CVA)    Time: 1610-9604 PT Time Calculation (min) (ACUTE ONLY): 27 min   Charges:   PT Evaluation $PT Eval Low Complexity: 1 Low PT Treatments $Gait Training: 8-22 mins        Candie Mile, PT, DPT Physical Therapist Acute Rehabilitation Services [place]   Ellouise Newer 08/14/2022, 11:13 AM

## 2022-08-14 NOTE — Progress Notes (Signed)
Summary: Todd Estrada is a 61 year old person living with stage IV  SCC of the lung undergoing chemoradiation, chronic combined systolic and diastolic HF last TTE EF 47-82%,  COPD, seizures, and history of CVA with residual right sided weakness and dysarthric who was admitted on 8/25 due to sepsis secondary to pneumonia and COPD exacerbation.  Patient has been on broad-spectrum spectrum antibiotics with vancomycin, Zosyn, and azithromycin.  Subjective: Patient laying comfortably in bed.  Patient extremely difficult to understand secondary to dysarthria from prior stroke.  He is able to answer yes or no questions and say some words..  Indicates that his breathing feels improved from admission but still feels "rough".  Indicates he felt short of breath while walking with PT today.  Otherwise notes he does feel thirsty.  Denies shortness of breath or cough while laying at rest.  Objective:  Vital signs in last 24 hours: Vitals:   08/13/22 2200 08/14/22 0007 08/14/22 0325 08/14/22 0500  BP: (!) 92/58 (!) 101/59 105/62   Pulse: 65 64 63   Resp: 17 16 20    Temp:  97.9 F (36.6 C) 97.8 F (36.6 C)   TempSrc:  Oral Oral   SpO2: 97% 97% 94%   Weight:    80.6 kg   Constitutional: Cachectic.  Laying in bed in no acute distress  HENT: Dry mucous membrane Cardiovascular: Normal rate, regular rhythm, no murmurs gallops rubs, no peripheral edema, no JVD Respiratory: Breath sounds diminished bilaterally no crackles or wheezes, no increased work of breathing GI: Nondistended, soft, nontender to palpation, normal active bowel sounds Neurological: Is alert and oriented to person place and situation, diminished strength on the right side, marked dysarthria Skin: Warm and dry.  Diminished skin turgor  Assessment/Plan:  Principal Problem:   Multifocal pneumonia Active Problems:   Dyslipidemia   Sepsis ([place])   Atypical chest pain   Acute on chronic anemia   Hypokalemia   Hypomagnesemia   COPD with  acute exacerbation (HCC)   Chronic combined systolic and diastolic heart failure (HCC)   History of seizures  Sepsis secondary to pneumonia-improved  With left upper lob consolidation with cavitation. ON RA without shortness of breath today. Did feel short of breath working with PT. Cultures negative thus far. MRSA swab negative. BP remains soft. Concern for aspiration given significant dysarthria from prior CVA. Continues to appear dehydrated. - Continue azithromycin and zosyn - Stop vancomycin - guaifenesin and tylenol PRN - LR @ 75 mL for 10 hours - Continue midodrine, hopefully can wean when BP improves  COPD exacerbation No wheezing on exam, on room air.  - Decrease steroids to prednisone 60 mg daily - continue albuterol and pulmicort  Chronic combined systolic and diastolic heart failure TTE EF 95-62% grade 1 systolic dysfunction. No signs of fluid overload on exam -continue to monitor fluid status  Prior CVA with dysarthria and right hemiparesis and dysarthria  -continue plavix 75 mg daily, atorvastatin - SLP evaluated reccommended dysphagia 2 diet and HH SLP - PT/OT recommending HH anticipate improvement   Pancytopenia  Stable likely in setting of chemoradiation.   Seizures - Continue keppra  SCC of the lung Follow with Dr. Earlie Server. Currently undergoing chemoradiation with carboplatin and paclitaxel weekly  Prior to Admission Living Arrangement: home Anticipated Discharge Location: home Barriers to Discharge: medical treatment Dispo: Anticipated discharge in approximately 2 day(s).   Iona Beard, MD 08/14/2022, 7:19 AM Pager: [phone] After 5pm on weekdays and 1pm on weekends: On Call pager  [phone]  

## 2022-08-14 NOTE — Evaluation (Signed)
Clinical/Bedside Swallow Evaluation Patient Details  Name: Todd Estrada MRN: [id number] Date of Birth: 1961-04-08  Today's Date: 08/14/2022 Time: SLP Start Time (ACUTE ONLY): 0105 SLP Stop Time (ACUTE ONLY): 79 SLP Time Calculation (min) (ACUTE ONLY): 20 min  Past Medical History:  Past Medical History:  Diagnosis Date   Asthma    COPD (chronic obstructive pulmonary disease) (HCC)    GERD (gastroesophageal reflux disease)    History of tracheostomy    03/09/22-04/11/22   HLD (hyperlipidemia)    Hypertension    Lung cancer ([place])    PAD (peripheral artery disease) ([place])    Seizures ([place]) 06/02/2022   Stroke ([place]) 02/2022   Past Surgical History:  Past Surgical History:  Procedure Laterality Date   BRONCHIAL BIOPSY  05/30/2022   Procedure: BRONCHIAL BIOPSIES;  Surgeon: Collene Gobble, MD;  Location: [facility] ENDOSCOPY;  Service: Pulmonary;;   BRONCHIAL BRUSHINGS  05/30/2022   Procedure: BRONCHIAL BRUSHINGS;  Surgeon: Collene Gobble, MD;  Location: [facility] ENDOSCOPY;  Service: Pulmonary;;   BRONCHIAL NEEDLE ASPIRATION BIOPSY  05/30/2022   Procedure: BRONCHIAL NEEDLE ASPIRATION BIOPSIES;  Surgeon: Collene Gobble, MD;  Location: [facility] ENDOSCOPY;  Service: Pulmonary;;   BRONCHIAL WASHINGS  05/30/2022   Procedure: BRONCHIAL WASHINGS;  Surgeon: Collene Gobble, MD;  Location: [facility] ENDOSCOPY;  Service: Pulmonary;;   ESOPHAGOGASTRODUODENOSCOPY (EGD) WITH PROPOFOL N/A 03/11/2022   Procedure: ESOPHAGOGASTRODUODENOSCOPY (EGD) WITH PROPOFOL;  Surgeon: Georganna Skeans, MD;  Location: [place];  Service: General;  Laterality: N/A;   FIDUCIAL MARKER PLACEMENT  05/30/2022   Procedure: FIDUCIAL MARKER PLACEMENT;  Surgeon: Collene Gobble, MD;  Location: [facility] ENDOSCOPY;  Service: Pulmonary;;   IR ANGIO INTRA EXTRACRAN SEL COM CAROTID INNOMINATE UNI R MOD SED  03/02/2022   IR CT HEAD LTD  03/02/2022   IR PERCUTANEOUS ART THROMBECTOMY/INFUSION INTRACRANIAL INC DIAG ANGIO  03/02/2022   PEG PLACEMENT N/A 03/11/2022    Procedure: PERCUTANEOUS ENDOSCOPIC GASTROSTOMY (PEG) PLACEMENT;  Surgeon: Georganna Skeans, MD;  Location: [place];  Service: General;  Laterality: N/A;   RADIOLOGY WITH ANESTHESIA N/A 03/02/2022   Procedure: IR WITH ANESTHESIA;  Surgeon: Luanne Bras, MD;  Location: [place];  Service: Radiology;  Laterality: N/A;   VIDEO BRONCHOSCOPY WITH RADIAL ENDOBRONCHIAL ULTRASOUND  05/30/2022   Procedure: VIDEO BRONCHOSCOPY WITH RADIAL ENDOBRONCHIAL ULTRASOUND;  Surgeon: Collene Gobble, MD;  Location: [facility] ENDOSCOPY;  Service: Pulmonary;;   HPI:  Patient is a 61 y.o. 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 He was recently in [facility] and discharged late April 2023. He presented to the hospital on 08/12/22 with sepsis due to severe multifocal PNA after presenting from home to [facility] ED c/o SOB. CTA chest showed Interval development of mild ground-glass infiltrate within the right lower lobe which may relate to acute infection or aspiration.    Assessment / Plan / Recommendation  Clinical Impression  Patient presenting with clinical s/s of dysphagia as per this bedside/clinical swallow evaluation. Results did appear fairly consistent with MBS results back in April of 2023 during his AIR stay. Today, he exhibited immediate/mild delayed coughing with thin liquids via straw sip but no coughing, throat clearing or other overt s/s that could be indicative of aspiration/penetration. He nodded head and said "yeah" after drinking nectar thick liquids, indicating that he could notice the difference and it was easier to swallow. When SLP looked at breakfast meal try that he had already finished, solid textures had  not been consumed but patient had consumed liquids and softer solids. SLP asked patient about this and he shook his head, then nodding when SLP asked if he wasnt able to chew/eat this food. SLP is recommending downgrade to Dys 2  (minced) solids and nectar thick liquids and will follow patient for diet toleration and ability to upgrade. SLP Visit Diagnosis: Dysphagia, unspecified (R13.10)    Aspiration Risk  Mild aspiration risk    Diet Recommendation Dysphagia 2 (Fine chop);Nectar-thick liquid   Liquid Administration via: Cup;Straw Medication Administration: Crushed with puree Supervision: Patient able to self feed;Intermittent supervision to cue for compensatory strategies Compensations: Slow rate;Small sips/bites;Minimize environmental distractions Postural Changes: Seated upright at 90 degrees    Other  Recommendations Oral Care Recommendations: Oral care BID Other Recommendations: Order thickener from pharmacy;Clarify dietary restrictions;Prohibited food (jello, ice cream, thin soups);Have oral suction available;Remove water pitcher    Recommendations for follow up therapy are one component of a multi-disciplinary discharge planning process, led by the attending physician.  Recommendations may be updated based on patient status, additional functional criteria and insurance authorization.  Follow up Recommendations Home health SLP      Assistance Recommended at Discharge Frequent or constant Supervision/Assistance  Functional Status Assessment Patient has had a recent decline in their functional status and demonstrates the ability to make significant improvements in function in a reasonable and predictable amount of time.  Frequency and Duration min 2x/week  1 week       Prognosis Prognosis for Safe Diet Advancement: Fair Barriers to Reach Goals: Time post onset;Severity of deficits      Swallow Study   General Date of Onset: 08/12/22 HPI: Patient is a 61 y.o. 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 He was recently in [facility] and discharged late April 2023. He presented to the hospital on 08/12/22 with sepsis due to  severe multifocal PNA after presenting from home to [facility] ED c/o SOB. CTA chest showed Interval development of mild ground-glass infiltrate within the right lower lobe which may relate to acute infection or aspiration. Type of Study: Bedside Swallow Evaluation Previous Swallow Assessment: BSE, MBS during recent past admission (discharged late April 2023 from [facility]) Diet Prior to this Study: Regular;Thin liquids Temperature Spikes Noted: No Respiratory Status: Room air History of Recent Intubation: No Behavior/Cognition: Alert;Cooperative;Pleasant mood Oral Cavity Assessment: Within Functional Limits Oral Care Completed by SLP: Recent completion by staff Oral Cavity - Dentition: Edentulous Vision: Functional for self-feeding Self-Feeding Abilities: Needs set up;Needs assist Patient Positioning: Upright in chair Baseline Vocal Quality: Normal Volitional Cough: Strong Volitional Swallow: Able to elicit    Oral/Motor/Sensory Function Overall Oral Motor/Sensory Function: Within functional limits   Ice Chips     Thin Liquid Thin Liquid: Impaired Presentation: Straw Pharyngeal  Phase Impairments: Suspected delayed Swallow;Cough - Immediate    Nectar Thick Nectar Thick Liquid: Within functional limits Presentation: Straw Other Comments: Patient nodded head saying "yeah" seeming to indicate he could swallow the nectar thick liquids better than thin   Honey Thick     Puree Puree: Not tested   Solid     Solid: Not tested      Sonia Baller, MA, CCC-SLP Speech Therapy

## 2022-08-14 NOTE — Evaluation (Signed)
Occupational Therapy Evaluation Patient Details Name: Todd Estrada MRN: [id number] DOB: 1961/09/24 Today's Date: 08/14/2022   History of Present Illness 61 y.o.  male with history of CVA-residual right-sided weakness/dysarthria, chronic combined systolic/diastolic heart failure, stage IV lung CA-getting chemo/radiation-presented with shortness of breath-patient was found to have sepsis due to PNA. Admitted 8/25   Clinical Impression   Pt with unclear baseline due to expressive difficulties, pt stating he lives with family at home and does not use AD for mobility. Pt with R deficits from prior CVA, currently needing, set up-max A for ADLs, min A for bed mobility, and min-modA for stand pivot transfer to chair, LOB noted needing mod A to recorrect. Pt following commands appropriately throughout session. Pt presenting with impairments listed below, will follow acutely. If pt has 24/7 assist at home, can go home with [facility], if not will need SNF level rehab at d/c.      Recommendations for follow up therapy are one component of a multi-disciplinary discharge planning process, led by the attending physician.  Recommendations may be updated based on patient status, additional functional criteria and insurance authorization.   Follow Up Recommendations  Other (comment) (could go home with HHOT if has 24/7 supervision/assist, if not will need SNF)    Assistance Recommended at Discharge Frequent or constant Supervision/Assistance  Patient can return home with the following A lot of help with walking and/or transfers;A lot of help with bathing/dressing/bathroom;Assistance with cooking/housework;Direct supervision/assist for medications management;Direct supervision/assist for financial management;Assist for transportation;Help with stairs or ramp for entrance    Functional Status Assessment  Patient has had a recent decline in their functional status and demonstrates the ability to make significant  improvements in function in a reasonable and predictable amount of time.  Equipment Recommendations  Tub/shower seat    Recommendations for Other Services PT consult     Precautions / Restrictions Precautions Precautions: Fall Required Braces or Orthoses: Other Brace Other Brace: AFO RLE, brace RUE Restrictions Weight Bearing Restrictions: No      Mobility Bed Mobility Overal bed mobility: Needs Assistance Bed Mobility: Sit to Supine       Sit to supine: Min assist   General bed mobility comments: assisting RLE back into bed    Transfers Overall transfer level: Needs assistance Equipment used: 1 person hand held assist Transfers: Sit to/from Stand, Bed to chair/wheelchair/BSC Sit to Stand: Min assist     Step pivot transfers: Mod assist     General transfer comment: mod A, cues to step RLE with transfer, cues to sit slowly on bed      Balance Overall balance assessment: Needs assistance Sitting-balance support: No upper extremity supported, Feet supported Sitting balance-Leahy Scale: Good     Standing balance support: No upper extremity supported, During functional activity Standing balance-Leahy Scale: Fair                             ADL either performed or assessed with clinical judgement   ADL Overall ADL's : Needs assistance/impaired Eating/Feeding: Set up   Grooming: Set up   Upper Body Bathing: Minimal assistance   Lower Body Bathing: Maximal assistance   Upper Body Dressing : Minimal assistance   Lower Body Dressing: Maximal assistance Lower Body Dressing Details (indicate cue type and reason): donning shoes Toilet Transfer: Moderate assistance;BSC/3in1;Stand-pivot   Toileting- Clothing Manipulation and Hygiene: Moderate assistance       Functional mobility during ADLs: Moderate assistance  Vision   Additional Comments: disconjugate gaze, pt does not report wearing glasses at baseline     Perception     Praxis       Pertinent Vitals/Pain       Hand Dominance Right (From prior note)   Extremity/Trunk Assessment Upper Extremity Assessment Upper Extremity Assessment: RUE deficits/detail RUE Deficits / Details: weaker distally vs proximally, brace on RUE, no active digit movement noted, ~40* shoulder AROM RUE Coordination: decreased fine motor;decreased gross motor   Lower Extremity Assessment Lower Extremity Assessment: Defer to PT evaluation RLE Deficits / Details: Distal weakness > proximal in RLE, wears AFO which benefited pt greatly while ambulating.   Cervical / Trunk Assessment Cervical / Trunk Assessment: Normal   Communication Communication Communication: Expressive difficulties   Cognition Arousal/Alertness: Awake/alert Behavior During Therapy: WFL for tasks assessed/performed Overall Cognitive Status: Difficult to assess                                 General Comments: pt following questions appropriately, able to respond to yes/no questions appropriately     General Comments  VSS on RA    Exercises     Shoulder Instructions      Home Living Family/patient expects to be discharged to:: Private residence Living Arrangements:  (family member) Available Help at Discharge: Family Type of Home: House Home Access: Stairs to enter Technical brewer of Steps: 2   Home Layout: One level     Bathroom Shower/Tub: Teacher, early years/pre: Standard Bathroom Accessibility: No   Home Equipment: Conservation officer, nature (2 wheels);Cane - single point;BSC/3in1;Wheelchair - manual   Additional Comments: Majority of information taken from previous notes 2/2 communication difficulties.      Prior Functioning/Environment Prior Level of Function : Independent/Modified Independent             Mobility Comments: Unable to state ADLs Comments: Unable to state        OT Problem List: Decreased strength;Decreased range of motion;Decreased activity  tolerance;Impaired balance (sitting and/or standing);Decreased cognition;Impaired vision/perception;Decreased coordination;Impaired UE functional use      OT Treatment/Interventions: Self-care/ADL training;Therapeutic exercise;Energy conservation;DME and/or AE instruction;Therapeutic activities;Patient/family education;Balance training    OT Goals(Current goals can be found in the care plan section) Acute Rehab OT Goals Patient Stated Goal: pt unable to state OT Goal Formulation: Patient unable to participate in goal setting Time For Goal Achievement: 08/28/22 Potential to Achieve Goals: Good ADL Goals Pt Will Perform Upper Body Dressing: with supervision;sitting Pt Will Perform Lower Body Dressing: with supervision;sitting/lateral leans;sit to/from stand;with adaptive equipment Pt Will Transfer to Toilet: with min guard assist;bedside commode;regular height toilet;ambulating;squat pivot transfer;stand pivot transfer  OT Frequency: Min 2X/week    Co-evaluation              AM-PAC OT "6 Clicks" Daily Activity     Outcome Measure Help from another person eating meals?: A Little Help from another person taking care of personal grooming?: A Little Help from another person toileting, which includes using toliet, bedpan, or urinal?: A Little Help from another person bathing (including washing, rinsing, drying)?: A Lot Help from another person to put on and taking off regular upper body clothing?: A Little Help from another person to put on and taking off regular lower body clothing?: A Lot 6 Click Score: 16   End of Session Equipment Utilized During Treatment: Gait belt Nurse Communication: Mobility status  Activity Tolerance: Patient  tolerated treatment well Patient left: in bed;with bed alarm set;with call bell/phone within reach  OT Visit Diagnosis: Unsteadiness on feet (R26.81);Other abnormalities of gait and mobility (R26.89);Muscle weakness (generalized) (M62.81);Cognitive  communication deficit (R41.841);Low vision, both eyes (H54.2);Other symptoms and signs involving cognitive function;Hemiplegia and hemiparesis Symptoms and signs involving cognitive functions:  (prior CVA) Hemiplegia - Right/Left: Right Hemiplegia - caused by:  (prior CVA)                Time: 5456-2563 OT Time Calculation (min): 19 min Charges:  OT General Charges $OT Visit: 1 Visit OT Evaluation $OT Eval Moderate Complexity: 1 Mod  Aldrich Lloyd, OTD, OTR/L Acute Rehab [phone] - [place] 08/14/2022, 11:49 AM

## 2022-08-15 ENCOUNTER — Ambulatory Visit
Admission: RE | Admit: 2022-08-15 | Discharge: 2022-08-15 | Disposition: A | Discharge status: Home / Self Care | Payer: Medicaid Other | Source: Ambulatory Visit | Attending: Radiation Oncology | Admitting: Radiation Oncology

## 2022-08-15 ENCOUNTER — Inpatient Hospital Stay: Payer: Medicaid Other

## 2022-08-15 ENCOUNTER — Ambulatory Visit: Payer: Medicaid Other

## 2022-08-15 ENCOUNTER — Other Ambulatory Visit: Payer: Self-pay

## 2022-08-15 ENCOUNTER — Inpatient Hospital Stay: Payer: Medicaid Other | Admitting: Internal Medicine

## 2022-08-15 DIAGNOSIS — I69322 Dysarthria following cerebral infarction: Secondary | ICD-10-CM

## 2022-08-15 DIAGNOSIS — J850 Gangrene and necrosis of lung: Secondary | ICD-10-CM

## 2022-08-15 DIAGNOSIS — I5042 Chronic combined systolic (congestive) and diastolic (congestive) heart failure: Secondary | ICD-10-CM | POA: Diagnosis not present

## 2022-08-15 DIAGNOSIS — D61811 Other drug-induced pancytopenia: Secondary | ICD-10-CM

## 2022-08-15 DIAGNOSIS — Z79632 Long term (current) use of antitumor antibiotic: Secondary | ICD-10-CM

## 2022-08-15 DIAGNOSIS — C3490 Malignant neoplasm of unspecified part of unspecified bronchus or lung: Secondary | ICD-10-CM

## 2022-08-15 DIAGNOSIS — I69959 Hemiplegia and hemiparesis following unspecified cerebrovascular disease affecting unspecified side: Secondary | ICD-10-CM

## 2022-08-15 DIAGNOSIS — J441 Chronic obstructive pulmonary disease with (acute) exacerbation: Secondary | ICD-10-CM | POA: Diagnosis not present

## 2022-08-15 DIAGNOSIS — Z87891 Personal history of nicotine dependence: Secondary | ICD-10-CM

## 2022-08-15 DIAGNOSIS — A419 Sepsis, unspecified organism: Secondary | ICD-10-CM | POA: Diagnosis not present

## 2022-08-15 LAB — RAD ONC ARIA SESSION SUMMARY
Course Elapsed Days: 42
Plan Fractions Treated to Date: 30
Plan Prescribed Dose Per Fraction: 2 Gy
Plan Total Fractions Prescribed: 30
Plan Total Prescribed Dose: 60 Gy
Reference Point Dosage Given to Date: 58.9619 Gy
Reference Point Session Dosage Given: 2 Gy
Session Number: 30

## 2022-08-15 MED ORDER — ENOXAPARIN SODIUM 40 MG/0.4ML IJ SOSY
40.0000 mg | PREFILLED_SYRINGE | INTRAMUSCULAR | Status: DC
  Administered 2022-08-15: 40 mg via SUBCUTANEOUS
  Filled 2022-08-15: qty 0.4

## 2022-08-15 NOTE — Progress Notes (Signed)
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  Blood pressures are improving and patient is not hypoxic  Overnight Events: No overnight events   Patient is resting in bed upon my exam.  Patient is currently resting in bed in no acute distress.  Patient is able to understand me, but is not able to appropriately respond.  This is baseline for him.  I tried to ask him to measure no questions so he can nod his head.  He currently states that he is still having some chest pain, but is not worse than usual.  He also reports having some shortness of breath, but is doing better.  He denies any other complaints at this time.  He does inform me that he has a relative named [name], who we can contact.   ROS:  Respiratory: Patient reports shortness of breath Cardiovascular: Patient endorse chest pain   ROS otherwise negative    Objective:  Vital signs in last 24 hours: Vitals:   08/14/22 1951 08/14/22 2300 08/15/22 0315 08/15/22 0500  BP: 111/65 111/86 109/60   Pulse: 62 (!) 59 63   Resp: 20 20 20    Temp: 97.8 F (36.6 C) 97.9 F (36.6 C) 97.8 F (36.6 C)   TempSrc: Oral Oral Oral   SpO2: 99% 95% 92%   Weight:    78 kg   Supplemental O2: Room Air SpO2: 92 %   Physical Exam:  Constitutional: Patient resting in bed in no acute distress. HENT: normocephalic atraumatic, mucous membranes moist Eyes: conjunctiva non-erythematous Cardiovascular: regular rate and rhythm, no m/r/g Pulmonary/Chest: Expiratory wheezing noted to bilateral lung bases Abdominal: soft, non-tender, non-distended MSK: normal bulk and tone Neurological: There is right-sided hemiparesis.  Left side  moves well with good coordination.  Patient is alert, but unsure if he is oriented given that he has speech issue.  Patient is able to comprehend my speech, but he has incomprehensible speech. Skin: warm and dry Psych: Normal mood and normal affect  Filed Weights   08/14/22 0500 08/15/22 0500  Weight: 80.6 kg 78 kg     Intake/Output Summary (Last 24 hours) at 08/15/2022 0724 Last data filed at 08/15/2022 0600 Gross per 24 hour  Intake 2194.18 ml  Output 2200 ml  Net -5.82 ml   Net IO Since Admission: 44.43 mL [08/15/22 0724]  Pertinent Labs:    Latest Ref Rng & Units 08/14/2022    3:07 AM 08/13/2022    9:46 AM 08/12/2022   10:14 PM  CBC  WBC 4.0 - 10.5 K/uL 2.5  2.0    Hemoglobin 13.0 - 17.0 g/dL 8.7  9.1  7.1   Hematocrit 39.0 - 52.0 % 25.9  27.3  21.0   Platelets 150 - 400 K/uL 129  110         Latest Ref Rng & Units 08/14/2022    3:07 AM 08/13/2022    9:46 AM 08/12/2022   10:14 PM  CMP  Glucose 70 - 99 mg/dL 162  174  114   BUN 8 - 23 mg/dL 14  11  15    Creatinine 0.61 - 1.24 mg/dL 0.86  0.78  0.70   Sodium 135 - 145 mmol/L 141  138  134   Potassium 3.5 - 5.1 mmol/L 3.7  3.6  3.1   Chloride 98 - 111 mmol/L 103  101  93   CO2 22 - 32 mmol/L 27  29    Calcium 8.9 - 10.3 mg/dL 8.6  8.5    Total Protein 6.5 - 8.1 g/dL 5.5  5.8    Total Bilirubin 0.3 - 1.2 mg/dL 0.7  0.8    Alkaline Phos 38 - 126 U/L 63  66    AST 15 - 41 U/L 20  29    ALT 0 - 44 U/L 28  34      Imaging: No results found.  Assessment/Plan:   Principal Problem:   Multifocal pneumonia Active Problems:   Dyslipidemia   Sepsis ([place])   Atypical chest pain   Acute on chronic anemia   Hypokalemia   Hypomagnesemia   COPD with acute exacerbation (HCC)   Chronic combined systolic and diastolic heart failure ([place])   History of seizures   Patient Summary: EDIS HUISH is a 61 y.o. with a pertinent PMH of small cell carcinoma lung stage IV on chemoradiation, CVA dysarthria and right-sided  hemiparesis, COPD, combined heart failure with ejection fraction of 40% (03/03/2022), who presented with chest pain and shortness of breath who was admitted for right lower lobe pneumonia and COPD exacerbation.   #Sepsis secondary to necrotizing pneumonia, improving -08/12/22 Chest CT angio showing stable chronic pulmonary embolism within left lower lobe.  There is also progression of airspace infiltrate within the left upper lobe representing necrotizing infection.  There is also a left upper lobe cystic pulmonary mass. Infiltrate also noted on right lower lobe. -Initially, patient was tachypneic, and hypotensive, which patient was started on fluids as well as midodrine.  Will discontinue midodrine and fluids at this time, and monitor blood pressure. -Patient currently on azithromycin day 4 and Zosyn day 2 to have broad-spectrum coverage -Follow-up blood cultures, no growth at day 2, continue to follow -Sputum culture was unable to be collected, but will attempt -Continue guaifenesin and Tylenol as needed  -Blood pressures are slowly increasing, patient remains afebrile and is not tachypneic -If patient continues to clinically improve, plan will be to transition patient to oral antibiotics after discussing with ID pharmacist   #COPD exacerbation likely secondary to necrotizing pneumonia, improving -Patient was initially wheezing and tachypneic -Wheezing is still present on expiration, but patient is not tachypneic -Patient is not requiring oxygen -Continue prednisone 60 mg, consider removing tomorrow -Continue home Pulmicort and albuterol -O2 saturations at 92% on room air -Continue monitor  #Chronic Combined systolic and diastolic heart failure  -TTE ECHO 03/03/22 showing grade 1 systolic dysfunction with EF of 40% -Discontinue IV fluids -Monitor volume status  #CVA with dysarthria and right hemiparesis -Chronic right hemiparesis and dysarthria noted -No worsening stroke  symptoms -Continue Plavix 75 mg daily -Continue atorvastatin 40 mg daily -Speech therapy recommending dysphagia 2 diet -PT/OT recommending either home health with 24/7 assistance versus SNF if 24/7 assistance is not available at home  #SCC of the lung stage IV -Patient currently on carboplatin and paclitaxel weekly with last treatment 08/08/2022 -Spoke with Dr. Julien Nordmann today who states that they will hold off treatment currently as patient being treated for pneumonia -Patient to follow-up outpatient with Dr. Earlie Server  #Pancytopenia likely secondary to chemoradiation -Most recent CBC showing WBC 2.5, hemoglobin 8.7, platelet 129 -Likely due to chemoradiation -Continue to monitor CBC  #Hypokalemia, Resolved -Initially potassium was 3.0 on admission -Most  recent potassium 2.7 after repletion -Monitor BMP  #History of Seizure -Of note, patient does seem to take Keppra 500 mg twice daily for seizures . On Further Chart review, it is noted that the patient had a seizure on 03/02/22 after he had the stroke the same day and has been on Keppra 500 mg BID.  -Patient has not had any seizures since  -Continue Keppra 500 mg twice daily  Diet:  Dysphagia 2 diet IVF: None,None VTE:  Lovenox 40 mg Code: DNR PT/OT recs: SNF vs HH (with 24/7 Assistance) Family Update: Talked with [name] who is the patient's cousin who states that [name] and [name]' daughter takes care of the patient at home.  Dispo: Anticipated discharge to  Home  in 2 days pending clinical improvement.   [facility] Resident PGY-1 Please contact the on call pager after 5 pm and on weekends at [phone].

## 2022-08-15 NOTE — Progress Notes (Signed)
Speech Language Pathology Treatment: Dysphagia  Patient Details Name: Todd Estrada MRN: [id number] DOB: 1961-01-18 Today's Date: 08/15/2022 Time: 1005-1020 SLP Time Calculation (min) (ACUTE ONLY): 15 min  Assessment / Plan / Recommendation Clinical Impression  Pt seen at bedside for assessment of diet tolerance. Pt was reclined in bed, self feeding breakfast with significant impulsivity. Pt was noted to have thin water and thin soda on his tray table. Intermittent strong congested but nonproductive cough was noted after drinking from these cups. SLP repositioned pt to be upright and thickened his water. No cough noted after nectar thick liquids while seated upright. Pt was encouraged to limit bolus size and rate to maximize swallow safety. Safe swallow precaution sheet updated to indicate the need for FULL supervision during po intake, due to impulsivity and difficulty with self feeding (quite a bit of his breakfast ended up on his chest). Also added to THICKEN ALL LIQUIDS to nectar consistency on safe swallow precaution sheet. SLP will continue to follow to assess diet tolerance and determine in instrumental study is needed.    HPI HPI: Patient is a 61 y.o. 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 He was recently in [facility] and discharged late April 2023. He presented to the hospital on 08/12/22 with sepsis due to severe multifocal PNA after presenting from home [facility] c/o SOB. CTA chest showed Interval development of mild ground-glass infiltrate within the right lower lobe which may relate to acute infection or aspiration.      SLP Plan  Continue with current plan of care      Recommendations for follow up therapy are one component of a multi-disciplinary discharge planning process, led by the attending physician.  Recommendations may be updated based on patient status, additional functional criteria and  insurance authorization.    Recommendations  Diet recommendations: Dysphagia 2 (fine chop);Nectar-thick liquid Liquids provided via: Cup Medication Administration: Crushed with puree Supervision: Patient able to self feed;Staff to assist with self feeding;Full supervision/cueing for compensatory strategies Compensations: Slow rate;Small sips/bites;Minimize environmental distractions Postural Changes and/or Swallow Maneuvers: Seated upright 90 degrees;Upright 30-60 min after meal                Oral Care Recommendations: Oral care BID Follow Up Recommendations: Home health SLP Assistance recommended at discharge: Frequent or constant Supervision/Assistance SLP Visit Diagnosis: Dysphagia, unspecified (R13.10) Plan: Continue with current plan of care          Mariadelcarmen Corella B. Quentin Ore, [facility], [place] Speech Language Pathologist Office: [phone]  Shonna Chock 08/15/2022, 10:36 AM

## 2022-08-15 NOTE — Progress Notes (Signed)
PT Cancellation Note  Patient Details Name: Todd Estrada MRN: [id number] DOB: 1961-01-26   Cancelled Treatment:    Reason Eval/Treat Not Completed: Patient at procedure or test/unavailable  Nursing reports pt recently to [facility] for chemoradiation.  Will f/u as able when pt returns. Abran Richard, PT Acute Rehab [facility] Rehab [phone]  Karlton Lemon 08/15/2022, 2:15 PM

## 2022-08-15 NOTE — Progress Notes (Addendum)
    Transition of Care [facility]) Screening Note   Patient Details  Name: Todd Estrada Date of Birth: January 19, 1961   Transition of Care [facility]) CM/SW Contact:    Cyndi Bender, RN Phone Number: 08/15/2022, 3:00 PM  Patient has medical history significant for stage IV lung cancer on chemotherapy/radiation, ischemic stroke with residual expressive aphasia, chronic systolic/diastolic heart failure, COPD, seizures, who is admitted to [facility] on 08/12/2022 with sepsis due to severe multifocal pneumonia.  Transition of Care Department [facility]) has reviewed patient and no TOC needs have been identified at this time. We will continue to monitor patient advancement through interdisciplinary progression rounds. If new patient transition needs arise, please place a TOC consult.  Left VM with [name] in regards support at discharge.  awaiting return call.

## 2022-08-16 ENCOUNTER — Other Ambulatory Visit (HOSPITAL_COMMUNITY): Payer: Self-pay

## 2022-08-16 ENCOUNTER — Ambulatory Visit
Admission: RE | Admit: 2022-08-16 | Discharge: 2022-08-16 | Disposition: A | Discharge status: Home / Self Care | Payer: Medicaid Other | Source: Ambulatory Visit | Attending: Radiation Oncology | Admitting: Radiation Oncology

## 2022-08-16 ENCOUNTER — Other Ambulatory Visit: Payer: Self-pay

## 2022-08-16 ENCOUNTER — Ambulatory Visit: Payer: Medicaid Other

## 2022-08-16 DIAGNOSIS — I69320 Aphasia following cerebral infarction: Secondary | ICD-10-CM

## 2022-08-16 DIAGNOSIS — C3490 Malignant neoplasm of unspecified part of unspecified bronchus or lung: Secondary | ICD-10-CM | POA: Diagnosis not present

## 2022-08-16 DIAGNOSIS — I69322 Dysarthria following cerebral infarction: Secondary | ICD-10-CM | POA: Diagnosis not present

## 2022-08-16 DIAGNOSIS — I5042 Chronic combined systolic (congestive) and diastolic (congestive) heart failure: Secondary | ICD-10-CM | POA: Diagnosis not present

## 2022-08-16 LAB — RAD ONC ARIA SESSION SUMMARY
Course Elapsed Days: 43
Plan Fractions Treated to Date: 1
Plan Prescribed Dose Per Fraction: 2 Gy
Plan Total Fractions Prescribed: 3
Plan Total Prescribed Dose: 6 Gy
Reference Point Dosage Given to Date: 2 Gy
Reference Point Session Dosage Given: 2 Gy
Session Number: 31

## 2022-08-16 LAB — BASIC METABOLIC PANEL
Anion gap: 9 (ref 5–15)
BUN: 19 mg/dL (ref 8–23)
CO2: 26 mmol/L (ref 22–32)
Calcium: 8.6 mg/dL — ABNORMAL LOW (ref 8.9–10.3)
Chloride: 104 mmol/L (ref 98–111)
Creatinine, Ser: 0.85 mg/dL (ref 0.61–1.24)
GFR, Estimated: >60 mL/min (ref >60)
Glucose, Bld: 122 mg/dL — ABNORMAL HIGH (ref 70–99)
Potassium: 4.2 mmol/L (ref 3.5–5.1)
Sodium: 139 mmol/L (ref 135–145)

## 2022-08-16 LAB — CBC
HCT: 28.8 % — ABNORMAL LOW (ref 39.0–52.0)
Hemoglobin: 9.5 g/dL — ABNORMAL LOW (ref 13.0–17.0)
MCH: 30.5 pg (ref 26.0–34.0)
MCHC: 33 g/dL (ref 30.0–36.0)
MCV: 92.6 fL (ref 80.0–100.0)
Platelets: 205 10*3/uL (ref 150–400)
RBC: 3.11 MIL/uL — ABNORMAL LOW (ref 4.22–5.81)
RDW: 14.2 % (ref 11.5–15.5)
WBC: 4.2 10*3/uL (ref 4.0–10.5)
nRBC: 0 % (ref 0.0–0.2)

## 2022-08-16 MED ORDER — GUAIFENESIN ER 600 MG PO TB12
600.0000 mg | ORAL_TABLET | Freq: Two times a day (BID) | ORAL | 0 refills | Status: AC | PRN
  Filled 2022-08-16: qty 20, 10d supply, fill #0

## 2022-08-16 MED ORDER — AMOXICILLIN-POT CLAVULANATE 875-125 MG PO TABS
1.0000 | ORAL_TABLET | Freq: Two times a day (BID) | ORAL | 0 refills | Status: AC
  Filled 2022-08-16: qty 10, 5d supply, fill #0

## 2022-08-16 MED ORDER — ALBUTEROL SULFATE (2.5 MG/3ML) 0.083% IN NEBU
2.5000 mg | INHALATION_SOLUTION | RESPIRATORY_TRACT | 12 refills | Status: AC | PRN
  Filled 2022-08-16: qty 180, 10d supply, fill #0

## 2022-08-16 NOTE — Progress Notes (Signed)
Spoke with [facility] to set up transport to pick pt up for his radiation treatment at 1:45.

## 2022-08-16 NOTE — Plan of Care (Signed)
  Problem: Clinical Measurements: Goal: Will remain free from infection Outcome: Progressing Goal: Diagnostic test results will improve Outcome: Progressing   Problem: Nutrition: Goal: Adequate nutrition will be maintained Outcome: Progressing   Problem: Coping: Goal: Level of anxiety will decrease Outcome: Progressing   Problem: Pain Managment: Goal: General experience of comfort will improve Outcome: Progressing   Problem: Safety: Goal: Ability to remain free from injury will improve Outcome: Progressing

## 2022-08-16 NOTE — TOC Transition Note (Signed)
Transition of Care [facility]) - CM/SW Discharge Note   Patient Details  Name: Todd Estrada MRN: [id number] Date of Birth: 04-10-1961  Transition of Care [facility]) CM/SW Contact:  Carles Collet, RN Phone Number: 08/16/2022, 2:50 PM   Clinical Narrative:     Damaris Schooner w patient's brother [name] regarding DC plans. Verified patient active w Alvis Lemmings, and [name] in agreeance to continue services with [facility]. [facility] notified that patient will DC today He states they have all needed DME, nebulizer, shower seat, RW etc at home already. No new DME needs for DC.  [name] states that he will pick up patient from the hospital. Asked bedside nurse to call him to coordinate time.  Meds coming up from [place].   Final next level of care: [place] Barriers to Discharge: No Barriers Identified   Patient Goals and CMS Choice Patient states their goals for this hospitalization and ongoing recovery are:: to go home CMS Medicare.gov Compare Post Acute Care list provided to:: Other (Comment Required) Choice offered to / list presented to : Sibling  Discharge Placement                       Discharge Plan and Services                DME Arranged: N/A         HH Arranged: PT, OT, Speech Therapy HH Agency: [place] Date [place]: 08/16/22 Time [place]: [id number] Representative spoke with at [place]: Alvis Lemmings  Social Determinants of Health ([place]) Interventions     Readmission Risk Interventions     No data to display

## 2022-08-16 NOTE — Hospital Course (Addendum)
Todd Estrada is a 61 y.o. 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     #Sepsis secondary to necrotizing pneumonia, resolved -08/12/22 Chest CT angio showing stable chronic pulmonary embolism within left lower lobe.  There is also progression of airspace infiltrate within the left upper lobe representing necrotizing infection.  There is also a left upper lobe cystic pulmonary mass. Infiltrate also noted on right lower lobe. -Initially, patient was tachypneic, and hypotensive, which patient was started on fluids as well as midodrine.   -Patient was subsequently taken off of fluids and midodrine, and blood pressure improved to 110s to 120s.  -Patient had 5 days of total antibiotics including ceftriaxone x1 day, cefepime x1 day, azithromycin x4 days, and Zosyn x3 days. -Patient continued to improve, patient remained normotensive, afebrile, and oxygenating well -Patient has been transitioned to Augmentin for the next 5 days for total of 10 days of antibiotics -Patient will be discharged on 5 days of Augmentin  -No growth of  blood cultures to date  #COPD exacerbation likely secondary to necrotizing pneumonia, resolved -Patient was initially wheezing and tachypneic on presentation likely secondary to this pneumonia -Patient was started on 60 mg prednisone, this is day 5 of 5 of prednisone, patient will be stopped on prednisone -Patient was continued on home albuterol, and Pulmicort. -Patient still having some residual expiratory wheezing, but patient is not hypoxic and is not short of breath -Patient will be discharged on home albuterol, Pulmicort, and Stiolto   #Chronic Combined systolic and diastolic heart failure  -TTE ECHO  03/03/22 showing grade 1 systolic dysfunction with EF of 40% -There was no exacerbation of heart failure on this admission.  Patient fluid status remained euvolemic.   #CVA with dysarthria, Aphasia, and  right hemiparesis -Patient at baseline with aphasia, dysarthria, and right hemiparesis -This is confirmed by chart review as well as speaking to patient's cousin, Todd Estrada who is patient's caretaker -No worsening symptoms during admission -Patient was continued on 75 mg Plavix daily and atorvastatin 40 mg daily -Patient had a dysphagia 2 diet during admission -PT/OT saw the patient, and recommended SNF versus home health as long as patient has 24/7 assistance at home -Todd Estrada states that the patient does have 24/7 assistance at home, and patient can come home  #SCC of the lung stage IV -Patient did have treatment with radiation on 08/15/2022 during admission.   -Patient's chemo was held giving infection. Last Chemo treatment on 08/08/22 -Dr. Earlie Server was informed of patient's admission, and Dr. Earlie Server will follow up the patient outpatient  #Pancytopenia likely secondary to chemoradiation -Patient initial CBC showing WBC 2.5, hemoglobin 8.7, platelet 129 -Likely due to chemoradiation -Most recent CBC showing white count of 4.2, hemoglobin 9.5, platelet 205  #Hypokalemia, Resolved -Patient was hypokalemic on admission, supplemented and resolved   #History of Seizure -Of note, patient does seem to take Keppra 500 mg twice daily for seizures . On Further Chart review, it is noted that the patient had a seizure on 03/02/22 after he had the stroke the same day and has been on Keppra 500 mg BID.  -Patient has not had any seizures since -Discharge on 500 mg Keppra BID.

## 2022-08-16 NOTE — Discharge Summary (Signed)
Name: Todd Estrada MRN: [id number] DOB: 1961/06/23 61 y.o. PCP: Todd Axe, MD  Date of Admission: 08/12/2022  7:59 PM Date of Discharge: 08/16/2022 Attending Physician: Dr.  Lottie Estrada  Discharge Diagnosis: Principal Problem:   Multifocal pneumonia Active Problems:   Dyslipidemia   Sepsis ([place])   Atypical chest pain   Acute on chronic anemia   Hypokalemia   Hypomagnesemia   COPD with acute exacerbation (HCC)   Chronic combined systolic and diastolic heart failure (HCC)   History of seizures   Aphasia as late effect of cerebrovascular accident    Discharge Medications: Allergies as of 08/16/2022   No Known Allergies      Medication List     STOP taking these medications    prochlorperazine 10 MG tablet Commonly known as: COMPAZINE       TAKE these medications    albuterol (2.5 MG/3ML) 0.083% nebulizer solution Commonly known as: PROVENTIL Take 3 mLs (2.5 mg total) by nebulization every 4 (four) hours as needed for wheezing or shortness of breath.   amoxicillin-clavulanate 875-125 MG tablet Commonly known as: AUGMENTIN Take 1 tablet by mouth 2 (two) times daily for 5 days.   atorvastatin 40 MG tablet Commonly known as: Lipitor Take 1 tablet (40 mg total) by mouth daily.   budesonide 0.5 MG/2ML nebulizer solution Commonly known as: PULMICORT USE 2 ML(0.5 MG) VIA NEBULIZER TWICE DAILY What changed: See the new instructions.   clopidogrel 75 MG tablet Commonly known as: PLAVIX Take 1 tablet (75 mg total) by mouth daily. Okay to restart this medication on 05/31/2022 What changed: additional instructions   folic acid 1 MG tablet Commonly known as: FOLVITE Take 1 tablet (1 mg total) by mouth daily.   levETIRAcetam 500 MG tablet Commonly known as: Keppra Take 1 tablet (500 mg total) by mouth 2 (two) times daily.   multivitamin with minerals Tabs tablet Take 1 tablet by mouth daily.   nicotine 14 mg/24hr patch Commonly known as: NICODERM CQ -  dosed in mg/24 hours Place 1 patch (14 mg total) onto the skin daily.   ondansetron 4 MG tablet Commonly known as: ZOFRAN Take 4 mg by mouth every 8 (eight) hours as needed for nausea or vomiting.   SM Mucus Relief 600 MG 12 hr tablet Generic drug: guaiFENesin Take 1 tablet (600 mg total) by mouth 2 (two) times daily as needed for up to 10 days for cough or to loosen phlegm.   Stiolto Respimat 2.5-2.5 MCG/ACT Aers Generic drug: Tiotropium Bromide-Olodaterol INHALE 2 PUFFS INTO THE LUNGS DAILY        Disposition and follow-up:   Todd Estrada was discharged from [facility] in Stable condition.  At the hospital follow up visit please address:  1.  Follow-up:  a.  Resuming Chemoradiation treatment.     b.  Resolving pneumonia, address if patient has more chest pain or shortness of breath    c. Monitor respiratory status and see if the patient's wheezing has resolved   2.  Labs / imaging needed at time of follow-up: Consider follow-up chest imaging for pneumonia  3.  Pending labs/ test needing follow-up: N/A  4.  Medication Changes  You were started on Augmentin for the next 5 days.  This is 875 mg twice daily for the next 5 days.  Please complete entire course.   Follow-up Appointments:  Follow-up Information     [facility]. Go to.   Why: I  have scheduled you an appointment on September 7 at 2:30 PM with Dr. Carin Estrada.  Please show up to that appointment for hospital follow-up. Contact information: [address] [place]        Todd Bears, MD. Call today.   Specialty: Oncology Why: To follow-up for lung cancer treatment Contact information: [place] [address] [phone]                 Hospital Course by problem list: Todd Estrada is a 61 y.o. 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     #Sepsis secondary to necrotizing pneumonia, resolved -08/12/22 Chest CT angio showing stable chronic pulmonary embolism within left lower lobe.  There is also progression of airspace infiltrate within the left upper lobe representing necrotizing infection.  There is also a left upper lobe cystic pulmonary mass. Infiltrate also noted on right lower lobe. -Initially, patient was tachypneic, and hypotensive, which patient was started on fluids as well as midodrine.   -Patient was subsequently taken off of fluids and midodrine, and blood pressure improved to 110s to 120s.  -Patient had 5 days of total antibiotics including ceftriaxone x1 day, cefepime x1 day, azithromycin x4 days, and Zosyn x3 days. -Patient continued to improve, patient remained normotensive, afebrile, and oxygenating well -Patient has been transitioned to Augmentin for the next 5 days for total of 10 days of antibiotics -Patient will be discharged on 5 days of Augmentin  -No growth of  blood cultures to date  #COPD exacerbation likely secondary to necrotizing pneumonia, resolved -Patient was initially wheezing and tachypneic on presentation likely secondary to this pneumonia -Patient was started on 60 mg prednisone, this is day 5 of 5 of prednisone, patient will be stopped on prednisone -Patient was continued on home albuterol, and Pulmicort. -Patient still having some residual expiratory wheezing, but patient is not hypoxic and is not short of breath -Patient will be discharged on home albuterol, Pulmicort, and Stiolto   #Chronic Combined systolic and diastolic heart failure  -TTE ECHO 03/03/22 showing grade 1 systolic dysfunction with EF of 40% -There was no exacerbation of heart failure on  this admission.  Patient fluid status remained euvolemic.   #CVA with dysarthria, Aphasia, and  right hemiparesis -Patient at baseline with aphasia, dysarthria, and right hemiparesis -This is confirmed by chart review as well as speaking to patient's cousin, Todd Estrada who is patient's caretaker -No worsening symptoms during admission -Patient was continued on 75 mg Plavix daily and atorvastatin 40 mg daily -Patient had a dysphagia 2 diet during admission -PT/OT saw the patient, and recommended SNF versus home health as long as patient has 24/7 assistance at home -Todd Estrada states that the patient does have 24/7 assistance at home, and patient can come home  #SCC of the lung stage IV -Patient did have treatment with radiation on 08/15/2022 during admission.   -Patient's chemo was held giving infection. Last Chemo treatment on 08/08/22 -Dr. Earlie Server was informed of patient's admission, and Dr. Earlie Server will follow up the patient outpatient  #Pancytopenia likely secondary to chemoradiation -Patient initial CBC showing WBC 2.5, hemoglobin 8.7, platelet 129 -Likely due to chemoradiation -Most recent CBC showing white count of 4.2, hemoglobin 9.5, platelet 205  #  Hypokalemia, Resolved -Patient was hypokalemic on admission, supplemented and resolved   #History of Seizure -Of note, patient does seem to take Keppra 500 mg twice daily for seizures . On Further Chart review, it is noted that the patient had a seizure on 03/02/22 after he had the stroke the same day and has been on Keppra 500 mg BID.  -Patient has not had any seizures since -Discharge on 500 mg Keppra BID.    Discharge Subjective:  Patient is currently resting in bed upon my exam. He denies any shortness of breath or any chest pain at this time. He his not able to talk to me properly given his baseline aphasia. Patient is otherwise understating about the plan at this time. He understands that his cousin Todd Estrada will pick him up from the  hospital. He is ready to go. Plan would be to transition the patient to Augmentin outpatient for the next 5 days.  Discharge Exam:   BP 93/63   Pulse 84   Temp 98.3 F (36.8 C) (Oral)   Resp 20   Wt 74.8 kg   SpO2 91%   BMI 22.37 kg/m  Constitutional: Patient is resting comfortably in bed upon my exam in no acute distress  HENT: normocephalic atraumatic, mucous membranes moist Eyes: conjunctiva non-erythematous Cardiovascular: regular rate and rhythm, no m/r/g Pulmonary/Chest: Normal work for breathing, no chest ttp, there is Left lower lobe expiratory wheezing noted  Abdominal: soft, non-tender, non-distended MSK: normal bulk and tone Neurological: Aphasic speech, patient can only say yes or no, and cannot make any other comprehensible speech  Skin: warm and dry Psych: Normal mood and affect   Pertinent Labs, Studies, and Procedures:     Latest Ref Rng & Units 08/16/2022    2:34 AM 08/14/2022    3:07 AM 08/13/2022    9:46 AM  CBC  WBC 4.0 - 10.5 K/uL 4.2  2.5  2.0   Hemoglobin 13.0 - 17.0 g/dL 9.5  8.7  9.1   Hematocrit 39.0 - 52.0 % 28.8  25.9  27.3   Platelets 150 - 400 K/uL 205  129  110        Latest Ref Rng & Units 08/16/2022    2:34 AM 08/14/2022    3:07 AM 08/13/2022    9:46 AM  CMP  Glucose 70 - 99 mg/dL 122  162  174   BUN 8 - 23 mg/dL 19  14  11    Creatinine 0.61 - 1.24 mg/dL 0.85  0.86  0.78   Sodium 135 - 145 mmol/L 139  141  138   Potassium 3.5 - 5.1 mmol/L 4.2  3.7  3.6   Chloride 98 - 111 mmol/L 104  103  101   CO2 22 - 32 mmol/L 26  27  29    Calcium 8.9 - 10.3 mg/dL 8.6  8.6  8.5   Total Protein 6.5 - 8.1 g/dL  5.5  5.8   Total Bilirubin 0.3 - 1.2 mg/dL  0.7  0.8   Alkaline Phos 38 - 126 U/L  63  66   AST 15 - 41 U/L  20  29   ALT 0 - 44 U/L  28  34     No results found.   Discharge Instructions: Discharge Instructions     Diet - low sodium heart healthy   Complete by: As directed    Increase activity slowly   Complete by: As directed        Given your  hospitalization, this is what we discussed.  1.  Given your chest pain and shortness of breath, this was likely due to a pneumonia that you had. You have been treated with antibiotics and you are improving well, and we will discharge you on antibiotics as well. We will discharge you on Augmentin 875-125 mg twice a day for the next 5 days. This will bring you up to a total of 10 days of antibiotics; 5 days of antibiotics in the hospital, and 5 days of antibiotics outside of the hospital.  2.  Regarding your COPD exacerbation, you were treated with prednisone in the hospital.  You will no longer be requiring prednisone.  Please continue your Stiolto, albuterol, and Pulmicort at home as prescribed.  3.  Given your history of stroke, please continue taking your Plavix and atorvastatin as prescribed.  4.  Regarding your lung cancer, please follow-up with Dr. Earlie Server, he is aware that you were in the hospital and is expecting to see you once you are discharged.  5.  Regarding your seizures, continue taking your Keppra 500 mg twice daily  6.  Please follow-up with your primary care physician, I have made an appointment for you with the internal medicine center, to have hospital follow-up with Dr. Carin Estrada, at 2:30 PM on September 7th.  7.  If you develop worsening chest pain or shortness of breath, or become febrile, please return back to the hospital to be evaluated.   SignedLeigh Aurora, DO 08/16/2022, 3:01 PM   Pager: [phone]

## 2022-08-16 NOTE — Care Management (Signed)
Verified w Alvis Lemmings that patient is active for home health services

## 2022-08-16 NOTE — Progress Notes (Addendum)
Speech Language Pathology Treatment: Dysphagia  Patient Details Name: Todd Estrada MRN: [id number] DOB: [birth year] Today's Date: 08/16/2022 Time: 1345-1400 SLP Time Calculation (min) (ACUTE ONLY): 15 min  Assessment / Plan / Recommendation Clinical Impression  Pt seen at bedside to assess tolerance of current diet (dys2/nectar thick liquids). Pt was seen during lunch. He continues to be unintelligible aside from "yes" and "no". Pt ate only pureed items (magic cup, mashed potatoes, peaches), and drank nectar thick liquids. Intermittent deep congested cough noted during the meal, raising concern for airway compromise. Recommend proceeding with instrumental swallow study to objectively assess swallow physiology. Will schedule with radiology for tomorrow.    HPI HPI: Patient is a 61 y.o. 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 He was recently in [facility] and discharged late April 2023. He presented to the hospital on 08/12/22 with sepsis due to severe multifocal PNA after presenting from home to [facility] ED c/o SOB. CTA chest showed Interval development of mild ground-glass infiltrate within the right lower lobe which may relate to acute infection or aspiration.      SLP Plan  MBS      Recommendations for follow up therapy are one component of a multi-disciplinary discharge planning process, led by the attending physician.  Recommendations may be updated based on patient status, additional functional criteria and insurance authorization.    Recommendations  Diet recommendations: Dysphagia 2 (fine chop);Nectar-thick liquid Liquids provided via: Cup;Straw Medication Administration: Crushed with puree Supervision: Patient able to self feed;Staff to assist with self feeding;Full supervision/cueing for compensatory strategies Compensations: Slow rate;Small sips/bites;Minimize environmental distractions Postural Changes  and/or Swallow Maneuvers: Seated upright 90 degrees;Upright 30-60 min after meal                Oral Care Recommendations: Oral care BID Follow Up Recommendations: Home health SLP Assistance recommended at discharge: Frequent or constant Supervision/Assistance SLP Visit Diagnosis: Dysphagia, unspecified (R13.10) Plan: MBS          Campbell Agramonte B. Quentin Ore, [facility], [place] Speech Language Pathologist Office: [phone]  Shonna Chock 08/16/2022, 2:06 PM

## 2022-08-16 NOTE — Discharge Instructions (Addendum)
Given your hospitalization, this is what we discussed.  1.  Given your chest pain and shortness of breath, this was likely due to a pneumonia that you had. You have been treated with antibiotics and you are improving well, and we will discharge you on antibiotics as well. We will discharge you on Augmentin 875-125 mg twice a day for the next 5 days. This will bring you up to a total of 10 days of antibiotics; 5 days of antibiotics in the hospital, and 5 days of antibiotics outside of the hospital.  2.  Regarding your COPD exacerbation, you were treated with prednisone in the hospital.  You will no longer be requiring prednisone.  Please continue your Stiolto, albuterol, and Pulmicort at home as prescribed.  3.  Given your history of stroke, please continue taking your Plavix and atorvastatin as prescribed.  4.  Regarding your lung cancer, please follow-up with Dr. Earlie Server, he is aware that you were in the hospital and is expecting to see you once you are discharged.  5.  Regarding your seizures, continue taking your Keppra 500 mg twice daily  6.  Please follow-up with your primary care physician, I have made an appointment for you with the internal medicine center, to have hospital follow-up with Dr. Carin Primrose, at 2:30 PM on September 7th.  7.  If you develop worsening chest pain or shortness of breath, or become febrile, please return back to the hospital to be evaluated.

## 2022-08-17 ENCOUNTER — Other Ambulatory Visit: Payer: Self-pay

## 2022-08-17 ENCOUNTER — Ambulatory Visit
Admission: RE | Admit: 2022-08-17 | Discharge: 2022-08-17 | Disposition: A | Discharge status: Home / Self Care | Payer: Medicaid Other | Source: Ambulatory Visit | Attending: Radiation Oncology | Admitting: Radiation Oncology

## 2022-08-17 ENCOUNTER — Ambulatory Visit: Payer: Medicaid Other

## 2022-08-17 DIAGNOSIS — Z510 Encounter for antineoplastic radiation therapy: Secondary | ICD-10-CM | POA: Diagnosis not present

## 2022-08-17 DIAGNOSIS — Z87891 Personal history of nicotine dependence: Secondary | ICD-10-CM | POA: Diagnosis not present

## 2022-08-17 DIAGNOSIS — C3411 Malignant neoplasm of upper lobe, right bronchus or lung: Secondary | ICD-10-CM | POA: Diagnosis present

## 2022-08-17 DIAGNOSIS — C3412 Malignant neoplasm of upper lobe, left bronchus or lung: Secondary | ICD-10-CM | POA: Diagnosis present

## 2022-08-17 DIAGNOSIS — Z5111 Encounter for antineoplastic chemotherapy: Secondary | ICD-10-CM | POA: Diagnosis not present

## 2022-08-17 LAB — RAD ONC ARIA SESSION SUMMARY
Course Elapsed Days: 44
Plan Fractions Treated to Date: 2
Plan Prescribed Dose Per Fraction: 2 Gy
Plan Total Fractions Prescribed: 3
Plan Total Prescribed Dose: 6 Gy
Reference Point Dosage Given to Date: 4 Gy
Reference Point Session Dosage Given: 2 Gy
Session Number: 32

## 2022-08-18 ENCOUNTER — Encounter: Payer: Self-pay | Admitting: Radiation Oncology

## 2022-08-18 ENCOUNTER — Ambulatory Visit: Payer: Medicaid Other

## 2022-08-18 ENCOUNTER — Other Ambulatory Visit: Payer: Self-pay

## 2022-08-18 ENCOUNTER — Ambulatory Visit
Admission: RE | Admit: 2022-08-18 | Discharge: 2022-08-18 | Disposition: A | Discharge status: Home / Self Care | Payer: Medicaid Other | Source: Ambulatory Visit | Attending: Radiation Oncology | Admitting: Radiation Oncology

## 2022-08-18 DIAGNOSIS — Z510 Encounter for antineoplastic radiation therapy: Secondary | ICD-10-CM | POA: Diagnosis not present

## 2022-08-18 LAB — RAD ONC ARIA SESSION SUMMARY
Course Elapsed Days: 45
Plan Fractions Treated to Date: 3
Plan Prescribed Dose Per Fraction: 2 Gy
Plan Total Fractions Prescribed: 3
Plan Total Prescribed Dose: 6 Gy
Reference Point Dosage Given to Date: 6 Gy
Reference Point Session Dosage Given: 2 Gy
Session Number: 33

## 2022-08-18 LAB — CULTURE, BLOOD (ROUTINE X 2)
Culture: NO GROWTH
Culture: NO GROWTH
Special Requests: ADEQUATE

## 2022-08-19 ENCOUNTER — Ambulatory Visit: Payer: Medicaid Other | Admitting: Radiation Oncology

## 2022-08-19 DIAGNOSIS — Z5111 Encounter for antineoplastic chemotherapy: Secondary | ICD-10-CM | POA: Insufficient documentation

## 2022-08-19 DIAGNOSIS — Z510 Encounter for antineoplastic radiation therapy: Secondary | ICD-10-CM | POA: Insufficient documentation

## 2022-08-19 DIAGNOSIS — C3411 Malignant neoplasm of upper lobe, right bronchus or lung: Secondary | ICD-10-CM | POA: Diagnosis present

## 2022-08-19 DIAGNOSIS — Z87891 Personal history of nicotine dependence: Secondary | ICD-10-CM | POA: Diagnosis not present

## 2022-08-19 DIAGNOSIS — C3412 Malignant neoplasm of upper lobe, left bronchus or lung: Secondary | ICD-10-CM | POA: Diagnosis present

## 2022-08-22 ENCOUNTER — Ambulatory Visit: Payer: Medicaid Other | Admitting: Radiation Oncology

## 2022-08-23 ENCOUNTER — Ambulatory Visit: Payer: Medicaid Other | Admitting: Radiation Oncology

## 2022-08-23 ENCOUNTER — Other Ambulatory Visit: Payer: Self-pay | Admitting: Radiation Oncology

## 2022-08-23 ENCOUNTER — Encounter: Payer: Self-pay | Admitting: Internal Medicine

## 2022-08-23 DIAGNOSIS — C3412 Malignant neoplasm of upper lobe, left bronchus or lung: Secondary | ICD-10-CM

## 2022-08-23 DIAGNOSIS — C3411 Malignant neoplasm of upper lobe, right bronchus or lung: Secondary | ICD-10-CM

## 2022-08-23 NOTE — Progress Notes (Signed)
                                                                                                                                                             Patient Name: Todd Estrada MRN: [id number] DOB: June 14, 1961 Referring Physician: Baltazar Apo (Profile Not Attached) Date of Service: 08/18/2022 [facility], [place]                                                        End Of Treatment Note  Diagnoses: C34.11-Malignant neoplasm of upper lobe, right bronchus or lung C34.12-Malignant neoplasm of upper lobe, left bronchus or lung  Cancer Staging: Synchronous Stage IB, cT2aN0M0, NSCLC 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 He was hospitalized however due to LUL but CTPA on 08/12/22 did not show any mediastinal adenopathy, and stable changes of the RUL nodule.   Plan: The patient will proceed with SBRT to the RUL nodule beginning 08/23/22, and will a call in about one month from the radiation oncology department and we will repeat a CT in about  3 months.  ________________________________________________    Carola Rhine, PAC

## 2022-08-24 ENCOUNTER — Ambulatory Visit: Payer: Medicaid Other | Admitting: Radiation Oncology

## 2022-08-24 ENCOUNTER — Encounter: Payer: Self-pay | Admitting: Podiatry

## 2022-08-24 ENCOUNTER — Ambulatory Visit (INDEPENDENT_AMBULATORY_CARE_PROVIDER_SITE_OTHER): Payer: Medicaid Other | Admitting: Podiatry

## 2022-08-24 DIAGNOSIS — D689 Coagulation defect, unspecified: Secondary | ICD-10-CM

## 2022-08-24 DIAGNOSIS — M79674 Pain in right toe(s): Secondary | ICD-10-CM

## 2022-08-24 DIAGNOSIS — M79675 Pain in left toe(s): Secondary | ICD-10-CM

## 2022-08-24 DIAGNOSIS — B351 Tinea unguium: Secondary | ICD-10-CM

## 2022-08-24 DIAGNOSIS — I739 Peripheral vascular disease, unspecified: Secondary | ICD-10-CM

## 2022-08-24 NOTE — Progress Notes (Signed)
  Subjective:  Patient ID: Todd Estrada, male    DOB: 1961/07/01,   MRN: [id number]  Chief Complaint  Patient presents with   Nail Problem     Routine foot care    61 y.o. male presents for concern of thickened elongated and painful nails that are difficult to trim. Requesting to have them trimmed today. Relates burning and tingling in their feet. Patient is not diabetic but with history of PAD and currently on blood thinner which puts him at risk for foot care.   Marland Kitchen   PCP:  [name], MD    . Denies any other pedal complaints. Denies n/v/f/c.   Past Medical History:  Diagnosis Date   Asthma    COPD (chronic obstructive pulmonary disease) (HCC)    GERD (gastroesophageal reflux disease)    History of tracheostomy    03/09/22-04/11/22   HLD (hyperlipidemia)    Hypertension    Lung cancer (HCC)    PAD (peripheral artery disease) (HCC)    Seizures ([place]) 06/02/2022   Stroke ([place]) 02/2022    Objective:  Physical Exam: Vascular: DP/PT pulses 1/4 bilateral. CFT <3 seconds. Absent hair growth on digits. Edema noted to bilateral lower extremities. Xerosis noted bilaterally.  Skin. No lacerations or abrasions bilateral feet. Nails 1-5 bilateral  are thickened discolored and elongated with subungual debris.  Musculoskeletal: MMT 5/5 bilateral lower extremities in DF, PF, Inversion and Eversion. Deceased ROM in DF of ankle joint.  Neurological: Sensation intact to light touch. Protective sensation intact  bilateral.     Assessment:   1. Pain due to onychomycosis of toenails of both feet   2. Coagulation defect ([place])   3. PAD (peripheral artery disease) ([place])      Plan:  Patient was evaluated and treated and all questions answered. -Discussed and educated patient on foot care, especially with  regards to the vascular, neurological and musculoskeletal systems.  -Discussed supportive shoes at all times and checking feet regularly.  -Mechanically debrided all nails 1-5 bilateral  using sterile nail nipper and filed with dremel without incident  -Answered all patient questions -Patient to return  in 3 months for at risk foot care -Patient advised to call the office if any problems or questions arise in the meantime.    Lorenda Peck, DPM

## 2022-08-25 ENCOUNTER — Encounter: Payer: Medicaid Other | Admitting: Student

## 2022-08-25 ENCOUNTER — Ambulatory Visit: Payer: Medicaid Other | Admitting: Radiation Oncology

## 2022-08-26 ENCOUNTER — Other Ambulatory Visit: Payer: Self-pay

## 2022-08-26 ENCOUNTER — Ambulatory Visit: Payer: Medicaid Other | Admitting: Radiation Oncology

## 2022-08-26 ENCOUNTER — Ambulatory Visit
Admission: RE | Admit: 2022-08-26 | Discharge: 2022-08-26 | Disposition: A | Discharge status: Home / Self Care | Payer: Medicaid Other | Source: Ambulatory Visit | Attending: Radiation Oncology | Admitting: Radiation Oncology

## 2022-08-26 DIAGNOSIS — Z510 Encounter for antineoplastic radiation therapy: Secondary | ICD-10-CM | POA: Diagnosis not present

## 2022-08-26 LAB — RAD ONC ARIA SESSION SUMMARY
Course Elapsed Days: 53
Plan Fractions Treated to Date: 1
Plan Prescribed Dose Per Fraction: 18 Gy
Plan Total Fractions Prescribed: 3
Plan Total Prescribed Dose: 54 Gy
Reference Point Dosage Given to Date: 18 Gy
Reference Point Session Dosage Given: 18 Gy
Session Number: 34

## 2022-08-27 ENCOUNTER — Observation Stay (HOSPITAL_COMMUNITY): Payer: Medicaid Other

## 2022-08-27 ENCOUNTER — Emergency Department (HOSPITAL_COMMUNITY): Payer: Medicaid Other

## 2022-08-27 ENCOUNTER — Other Ambulatory Visit: Payer: Self-pay

## 2022-08-27 ENCOUNTER — Encounter (HOSPITAL_COMMUNITY): Payer: Self-pay | Admitting: Emergency Medicine

## 2022-08-27 ENCOUNTER — Inpatient Hospital Stay (HOSPITAL_COMMUNITY)
Admission: EM | Admit: 2022-08-27 | Discharge: 2022-08-30 | DRG: 552 | Disposition: A | Discharge status: Home Health | Payer: Medicaid Other | Attending: Internal Medicine | Admitting: Internal Medicine

## 2022-08-27 DIAGNOSIS — W19XXXA Unspecified fall, initial encounter: Secondary | ICD-10-CM | POA: Diagnosis not present

## 2022-08-27 DIAGNOSIS — Z841 Family history of disorders of kidney and ureter: Secondary | ICD-10-CM

## 2022-08-27 DIAGNOSIS — R569 Unspecified convulsions: Secondary | ICD-10-CM | POA: Diagnosis present

## 2022-08-27 DIAGNOSIS — I639 Cerebral infarction, unspecified: Secondary | ICD-10-CM | POA: Diagnosis present

## 2022-08-27 DIAGNOSIS — Z6820 Body mass index (BMI) 20.0-20.9, adult: Secondary | ICD-10-CM

## 2022-08-27 DIAGNOSIS — J449 Chronic obstructive pulmonary disease, unspecified: Secondary | ICD-10-CM | POA: Diagnosis present

## 2022-08-27 DIAGNOSIS — I5022 Chronic systolic (congestive) heart failure: Secondary | ICD-10-CM | POA: Diagnosis present

## 2022-08-27 DIAGNOSIS — E440 Moderate protein-calorie malnutrition: Secondary | ICD-10-CM | POA: Diagnosis present

## 2022-08-27 DIAGNOSIS — R296 Repeated falls: Secondary | ICD-10-CM | POA: Diagnosis present

## 2022-08-27 DIAGNOSIS — Z7401 Bed confinement status: Secondary | ICD-10-CM

## 2022-08-27 DIAGNOSIS — S12601A Unspecified nondisplaced fracture of seventh cervical vertebra, initial encounter for closed fracture: Secondary | ICD-10-CM | POA: Diagnosis not present

## 2022-08-27 DIAGNOSIS — Z808 Family history of malignant neoplasm of other organs or systems: Secondary | ICD-10-CM

## 2022-08-27 DIAGNOSIS — Y92013 Bedroom of single-family (private) house as the place of occurrence of the external cause: Secondary | ICD-10-CM

## 2022-08-27 DIAGNOSIS — I959 Hypotension, unspecified: Secondary | ICD-10-CM

## 2022-08-27 DIAGNOSIS — I9589 Other hypotension: Secondary | ICD-10-CM | POA: Diagnosis present

## 2022-08-27 DIAGNOSIS — R55 Syncope and collapse: Secondary | ICD-10-CM

## 2022-08-27 DIAGNOSIS — C3490 Malignant neoplasm of unspecified part of unspecified bronchus or lung: Secondary | ICD-10-CM | POA: Diagnosis present

## 2022-08-27 DIAGNOSIS — Z7902 Long term (current) use of antithrombotics/antiplatelets: Secondary | ICD-10-CM

## 2022-08-27 DIAGNOSIS — I69351 Hemiplegia and hemiparesis following cerebral infarction affecting right dominant side: Secondary | ICD-10-CM

## 2022-08-27 DIAGNOSIS — I69322 Dysarthria following cerebral infarction: Secondary | ICD-10-CM

## 2022-08-27 DIAGNOSIS — Z800 Family history of malignant neoplasm of digestive organs: Secondary | ICD-10-CM

## 2022-08-27 DIAGNOSIS — Z8673 Personal history of transient ischemic attack (TIA), and cerebral infarction without residual deficits: Secondary | ICD-10-CM | POA: Diagnosis present

## 2022-08-27 DIAGNOSIS — Z66 Do not resuscitate: Secondary | ICD-10-CM | POA: Diagnosis present

## 2022-08-27 DIAGNOSIS — Z7951 Long term (current) use of inhaled steroids: Secondary | ICD-10-CM

## 2022-08-27 DIAGNOSIS — C3412 Malignant neoplasm of upper lobe, left bronchus or lung: Secondary | ICD-10-CM | POA: Diagnosis present

## 2022-08-27 DIAGNOSIS — J189 Pneumonia, unspecified organism: Secondary | ICD-10-CM

## 2022-08-27 DIAGNOSIS — C3411 Malignant neoplasm of upper lobe, right bronchus or lung: Secondary | ICD-10-CM | POA: Diagnosis present

## 2022-08-27 DIAGNOSIS — W1811XA Fall from or off toilet without subsequent striking against object, initial encounter: Secondary | ICD-10-CM | POA: Diagnosis present

## 2022-08-27 DIAGNOSIS — A0472 Enterocolitis due to Clostridium difficile, not specified as recurrent: Secondary | ICD-10-CM | POA: Diagnosis present

## 2022-08-27 DIAGNOSIS — Z79899 Other long term (current) drug therapy: Secondary | ICD-10-CM

## 2022-08-27 DIAGNOSIS — S129XXA Fracture of neck, unspecified, initial encounter: Secondary | ICD-10-CM

## 2022-08-27 DIAGNOSIS — I5042 Chronic combined systolic (congestive) and diastolic (congestive) heart failure: Secondary | ICD-10-CM | POA: Diagnosis present

## 2022-08-27 DIAGNOSIS — Z87898 Personal history of other specified conditions: Secondary | ICD-10-CM

## 2022-08-27 DIAGNOSIS — I69320 Aphasia following cerebral infarction: Secondary | ICD-10-CM

## 2022-08-27 DIAGNOSIS — F17210 Nicotine dependence, cigarettes, uncomplicated: Secondary | ICD-10-CM | POA: Diagnosis present

## 2022-08-27 LAB — URINALYSIS, ROUTINE W REFLEX MICROSCOPIC
Bacteria, UA: NONE SEEN
Bilirubin Urine: NEGATIVE
Glucose, UA: NEGATIVE mg/dL
Hgb urine dipstick: NEGATIVE
Ketones, ur: NEGATIVE mg/dL
Leukocytes,Ua: NEGATIVE
Nitrite: NEGATIVE
Protein, ur: 30 mg/dL — AB
Specific Gravity, Urine: 1.019 (ref 1.005–1.030)
pH: 5 (ref 5.0–8.0)

## 2022-08-27 LAB — CBC WITH DIFFERENTIAL/PLATELET
Abs Immature Granulocytes: 0.03 10*3/uL (ref 0.00–0.07)
Basophils Absolute: 0 10*3/uL (ref 0.0–0.1)
Basophils Relative: 0 %
Eosinophils Absolute: 0 10*3/uL (ref 0.0–0.5)
Eosinophils Relative: 0 %
HCT: 32.2 % — ABNORMAL LOW (ref 39.0–52.0)
Hemoglobin: 10.2 g/dL — ABNORMAL LOW (ref 13.0–17.0)
Immature Granulocytes: 0 %
Lymphocytes Relative: 6 %
Lymphs Abs: 0.5 10*3/uL — ABNORMAL LOW (ref 0.7–4.0)
MCH: 30.4 pg (ref 26.0–34.0)
MCHC: 31.7 g/dL (ref 30.0–36.0)
MCV: 96.1 fL (ref 80.0–100.0)
Monocytes Absolute: 0.6 10*3/uL (ref 0.1–1.0)
Monocytes Relative: 7 %
Neutro Abs: 7.3 10*3/uL (ref 1.7–7.7)
Neutrophils Relative %: 87 %
Platelets: 424 10*3/uL — ABNORMAL HIGH (ref 150–400)
RBC: 3.35 MIL/uL — ABNORMAL LOW (ref 4.22–5.81)
RDW: 16.4 % — ABNORMAL HIGH (ref 11.5–15.5)
WBC: 8.5 10*3/uL (ref 4.0–10.5)
nRBC: 0 % (ref 0.0–0.2)

## 2022-08-27 LAB — I-STAT CHEM 8, ED
BUN: 16 mg/dL (ref 8–23)
Calcium, Ion: 0.92 mmol/L — ABNORMAL LOW (ref 1.15–1.40)
Chloride: 98 mmol/L (ref 98–111)
Creatinine, Ser: 0.8 mg/dL (ref 0.61–1.24)
Glucose, Bld: 110 mg/dL — ABNORMAL HIGH (ref 70–99)
HCT: 31 % — ABNORMAL LOW (ref 39.0–52.0)
Hemoglobin: 10.5 g/dL — ABNORMAL LOW (ref 13.0–17.0)
Potassium: 3.7 mmol/L (ref 3.5–5.1)
Sodium: 135 mmol/L (ref 135–145)
TCO2: 27 mmol/L (ref 22–32)

## 2022-08-27 LAB — TSH: TSH: 1.496 u[IU]/mL (ref 0.350–4.500)

## 2022-08-27 MED ORDER — FOLIC ACID 1 MG PO TABS
1.0000 mg | ORAL_TABLET | Freq: Every day | ORAL | Status: DC
  Administered 2022-08-27 – 2022-08-30 (×4): 1 mg via ORAL
  Filled 2022-08-27 (×4): qty 1

## 2022-08-27 MED ORDER — SODIUM CHLORIDE 0.9 % IV SOLN: INTRAVENOUS | Status: AC

## 2022-08-27 MED ORDER — LIDOCAINE 5 % EX PTCH
1.0000 | MEDICATED_PATCH | CUTANEOUS | Status: DC
Start: 2022-08-27 — End: 2022-08-27
  Filled 2022-08-27: qty 1

## 2022-08-27 MED ORDER — SODIUM CHLORIDE 0.9% FLUSH
10.0000 mL | Freq: Once | INTRAVENOUS | Status: AC
  Administered 2022-08-27: 10 mL via INTRAVENOUS

## 2022-08-27 MED ORDER — SODIUM CHLORIDE 0.9 % IV SOLN: INTRAVENOUS | Status: DC

## 2022-08-27 MED ORDER — ATORVASTATIN CALCIUM 40 MG PO TABS
40.0000 mg | ORAL_TABLET | Freq: Every day | ORAL | Status: DC
  Administered 2022-08-27 – 2022-08-30 (×4): 40 mg via ORAL
  Filled 2022-08-27 (×4): qty 1

## 2022-08-27 MED ORDER — SODIUM CHLORIDE 0.9 % IV BOLUS
250.0000 mL | Freq: Once | INTRAVENOUS | Status: AC
  Administered 2022-08-27: 250 mL via INTRAVENOUS

## 2022-08-27 MED ORDER — LEVETIRACETAM IN NACL 500 MG/100ML IV SOLN
500.0000 mg | Freq: Two times a day (BID) | INTRAVENOUS | Status: DC
Start: 2022-08-27 — End: 2022-08-29
  Administered 2022-08-27 – 2022-08-29 (×5): 500 mg via INTRAVENOUS
  Filled 2022-08-27 (×5): qty 100

## 2022-08-27 MED ORDER — CLOPIDOGREL BISULFATE 75 MG PO TABS
75.0000 mg | ORAL_TABLET | Freq: Every day | ORAL | Status: DC
  Administered 2022-08-27 – 2022-08-30 (×4): 75 mg via ORAL
  Filled 2022-08-27 (×4): qty 1

## 2022-08-27 MED ORDER — SODIUM CHLORIDE 0.9 % IV SOLN: 3.0000 g | Freq: Once | INTRAVENOUS | Status: DC

## 2022-08-27 MED ORDER — NICOTINE 14 MG/24HR TD PT24
14.0000 mg | MEDICATED_PATCH | Freq: Every day | TRANSDERMAL | Status: DC
Start: 2022-08-27 — End: 2022-08-30
  Administered 2022-08-27 – 2022-08-30 (×4): 14 mg via TRANSDERMAL
  Filled 2022-08-27 (×4): qty 1

## 2022-08-27 MED ORDER — KETOROLAC TROMETHAMINE 30 MG/ML IJ SOLN
30.0000 mg | Freq: Once | INTRAMUSCULAR | Status: AC
  Administered 2022-08-27: 30 mg via INTRAVENOUS
  Filled 2022-08-27: qty 1

## 2022-08-27 MED ORDER — LIDOCAINE 5 % EX PTCH
1.0000 | MEDICATED_PATCH | CUTANEOUS | Status: DC
  Administered 2022-08-27 – 2022-08-30 (×4): 1 via TRANSDERMAL
  Filled 2022-08-27 (×3): qty 1

## 2022-08-27 MED ORDER — ALBUTEROL SULFATE (2.5 MG/3ML) 0.083% IN NEBU: 2.5000 mg | INHALATION_SOLUTION | RESPIRATORY_TRACT | Status: DC | PRN

## 2022-08-27 MED ORDER — ENOXAPARIN SODIUM 40 MG/0.4ML IJ SOSY
40.0000 mg | PREFILLED_SYRINGE | INTRAMUSCULAR | Status: DC
  Administered 2022-08-27 – 2022-08-29 (×3): 40 mg via SUBCUTANEOUS
  Filled 2022-08-27 (×3): qty 0.4

## 2022-08-27 MED ORDER — ARFORMOTEROL TARTRATE 15 MCG/2ML IN NEBU
15.0000 ug | INHALATION_SOLUTION | Freq: Two times a day (BID) | RESPIRATORY_TRACT | Status: DC
  Administered 2022-08-27 – 2022-08-30 (×7): 15 ug via RESPIRATORY_TRACT
  Filled 2022-08-27 (×7): qty 2

## 2022-08-27 MED ORDER — BUDESONIDE 0.5 MG/2ML IN SUSP
0.5000 mg | Freq: Two times a day (BID) | RESPIRATORY_TRACT | Status: DC
Start: 2022-08-27 — End: 2022-08-30
  Administered 2022-08-27 – 2022-08-30 (×7): 0.5 mg via RESPIRATORY_TRACT
  Filled 2022-08-27 (×7): qty 2

## 2022-08-27 MED ORDER — UMECLIDINIUM BROMIDE 62.5 MCG/ACT IN AEPB
1.0000 | INHALATION_SPRAY | Freq: Every day | RESPIRATORY_TRACT | Status: DC
  Administered 2022-08-28 – 2022-08-30 (×3): 1 via RESPIRATORY_TRACT
  Filled 2022-08-27 (×2): qty 7

## 2022-08-27 MED ORDER — SODIUM CHLORIDE 0.9 % IV BOLUS: 500.0000 mL | Freq: Once | INTRAVENOUS | Status: DC

## 2022-08-27 MED ORDER — OXYCODONE HCL 5 MG PO TABS
5.0000 mg | ORAL_TABLET | ORAL | Status: DC | PRN
  Administered 2022-08-27 – 2022-08-28 (×2): 5 mg via ORAL
  Filled 2022-08-27 (×3): qty 1

## 2022-08-27 NOTE — H&P (Signed)
Date: 08/27/2022               Patient Name:  Todd Estrada MRN: [id number]  DOB: 1961/02/24 Age / Sex: 61 y.o., male   PCP: [name], MD         Medical Service: Internal Medicine Teaching Service         Attending Physician: Dr. 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  Patient is severely dysarthric at baseline and communication is limited primarily to yes or no responses. He fell yesterday 9-8 at home and has been experiencing right-sided chest pain since then. It is unclear whether he tripped over furniture or passed out. Originally, he stated that he tripped over the rug but then indicated that he had no memory of falling. He denies prodromal symptoms of lightheadedness, dizziness, palpitations, chest pain, shortness of breath, but does report a feeling warm prior to his fall.  He was recently hospitalized on 8-25 for sepsis secondary to necrotizing pneumonia and discharged in stable condition with a course of antibiotics.   ED Course: Patient was afebrile but tachypneic and hypotensive upon arrival to the ED. Laboratory testing notable for RBC 3.4, Hgb 10.2, Plat 424, Na 134, K 3.1, and Cl 93. Head CT revealed nondisplaced right transverse process C7 fracture without evidence of acute intracranial injury and chronic infarcts. Pelvic radiograph negative for hip fracture or dislocation. Chest radiograph also negative for evidence of trauma.    Meds:  No  outpatient medications have been marked as taking for the 08/27/22 encounter [facility] Encounter).    Past Medical History  Past Surgical History:  Procedure Laterality Date   BRONCHIAL BIOPSY  05/30/2022   Procedure: BRONCHIAL BIOPSIES;  Surgeon: Collene Gobble, MD;  Location: [facility] ENDOSCOPY;  Service: Pulmonary;;   BRONCHIAL BRUSHINGS  05/30/2022   Procedure: BRONCHIAL BRUSHINGS;  Surgeon: Collene Gobble, MD;  Location: [facility] ENDOSCOPY;  Service: Pulmonary;;   BRONCHIAL NEEDLE ASPIRATION BIOPSY  05/30/2022   Procedure: BRONCHIAL NEEDLE ASPIRATION BIOPSIES;  Surgeon: Collene Gobble, MD;  Location: [place];  Service: Pulmonary;;   BRONCHIAL WASHINGS  05/30/2022   Procedure: BRONCHIAL WASHINGS;  Surgeon: Collene Gobble, MD;  Location: [facility] ENDOSCOPY;  Service: Pulmonary;;   ESOPHAGOGASTRODUODENOSCOPY (EGD) WITH PROPOFOL N/A 03/11/2022   Procedure: ESOPHAGOGASTRODUODENOSCOPY (EGD) WITH PROPOFOL;  Surgeon: Georganna Skeans, MD;  Location: [place];  Service: General;  Laterality: N/A;   FIDUCIAL MARKER PLACEMENT  05/30/2022   Procedure: FIDUCIAL MARKER PLACEMENT;  Surgeon: Collene Gobble, MD;  Location: [facility] ENDOSCOPY;  Service: Pulmonary;;   IR ANGIO INTRA EXTRACRAN SEL COM CAROTID INNOMINATE UNI R MOD SED  03/02/2022   IR CT HEAD LTD  03/02/2022   IR PERCUTANEOUS ART THROMBECTOMY/INFUSION INTRACRANIAL INC DIAG ANGIO  03/02/2022   PEG PLACEMENT N/A 03/11/2022   Procedure: PERCUTANEOUS ENDOSCOPIC GASTROSTOMY (PEG) PLACEMENT;  Surgeon: Georganna Skeans, MD;  Location: [facility]  ENDOSCOPY;  Service: General;  Laterality: N/A;   RADIOLOGY WITH ANESTHESIA N/A 03/02/2022   Procedure: IR WITH ANESTHESIA;  Surgeon: Luanne Bras, MD;  Location: [place];  Service: Radiology;  Laterality: N/A;   VIDEO BRONCHOSCOPY WITH RADIAL ENDOBRONCHIAL ULTRASOUND  05/30/2022   Procedure: VIDEO BRONCHOSCOPY WITH RADIAL ENDOBRONCHIAL ULTRASOUND;  Surgeon: Collene Gobble, MD;  Location: [facility] ENDOSCOPY;  Service: Pulmonary;;     Social:  Social History   Socioeconomic History   Marital status: Unknown    Spouse name: Not on file   Number of children: Not on file   Years of education: Not on file   Highest education level: Not on file  Occupational History   Not on file  Tobacco Use   Smoking status: Former    Types: Cigarettes    Quit date: 05/02/2022    Years since quitting: 0.3    Passive exposure: Never   Smokeless tobacco: Never  Vaping Use   Vaping Use: Never used  Substance and Sexual Activity   Alcohol use: Not Currently   Drug use: No   Sexual activity: Not on file  Other Topics Concern   Not on file  Social History Narrative   Not on file   Social Determinants of Health   Financial Resource Strain: Not on file  Food Insecurity: Not on file  Transportation Needs: Not on file  Physical Activity: Not on file  Stress: Not on file  Social Connections: Not on file  Intimate Partner Violence: Not on file      Family History:  Family History  Problem Relation Age of Onset   Throat cancer Mother    Liver cancer Father    Kidney failure Sister    Cancer - Lung Paternal Uncle       Allergies: Allergies as of 08/27/2022   (No Known Allergies)    Review of Systems: A complete ROS was negative except as per HPI.   OBJECTIVE:   Physical Exam: Blood pressure (!) 89/74, pulse 88, temperature 98.5 F (36.9 C), temperature source Oral, resp. rate (!) 24, height 6' (1.829 m), weight 74.8 kg, SpO2 97 %.   General:   awake and alert, stiffly lying in bed, cooperative, not in acute distress Skin:   warm and dry, intact without any obvious lesions or scars, no rashes or lesions Head:   normocephalic and atraumatic Eyes:   extraocular movements intact, conjunctivae pink, no periorbital swelling or scleral icterus Lungs:   normal respiratory effort, breathing unlabored, symmetrical chest rise, course breath sounds bilaterally Cardiac:   regular rate and rhythm, normal S1 and S2, no  pitting edema Abdomen:   soft and non-distended, normoactive bowel sounds present in all four quadrants, no tenderness to palpation or rigidity Musculoskeletal:   tenderness to palpation along lateral aspect of lower rib cage border on right, motor strength 5 /5 in all left upper and lower extremity, 3/5 in right lower extremity, 2/5 in right upper extremity Neurologic:   oriented to person only, sensation to light touch intact in all four extremities but diminished in left upper extremity Psychiatric:   mood and affect normal, severely dysarthric borderline expressive aphasia   Labs: CBC    Component Value Date/Time   WBC 8.5 08/27/2022 0345   RBC 3.35 (L) 08/27/2022 0345   HGB 10.5 (L) 08/27/2022 0418   HGB 10.8 (L) 08/08/2022 1109   HCT 31.0 (L) 08/27/2022 0418   PLT 424 (H) 08/27/2022 0345   PLT 78 (L) 08/08/2022  1109   MCV 96.1 08/27/2022 0345   MCH 30.4 08/27/2022 0345   MCHC 31.7 08/27/2022 0345   RDW 16.4 (H) 08/27/2022 0345   LYMPHSABS 0.5 (L) 08/27/2022 0345   MONOABS 0.6 08/27/2022 0345   EOSABS 0.0 08/27/2022 0345   BASOSABS 0.0 08/27/2022 0345     CMP     Component Value Date/Time   NA 135 08/27/2022 0418   K 3.7 08/27/2022 0418   CL 98 08/27/2022 0418   CO2 26 08/16/2022 0234   GLUCOSE 110 (H) 08/27/2022 0418   BUN 16 08/27/2022 0418   CREATININE 0.80 08/27/2022 0418   CREATININE 0.78 08/08/2022 1028   CALCIUM 8.6 (L) 08/16/2022 0234   PROT 5.5 (L) 08/14/2022 0307   ALBUMIN 2.1 (L) 08/14/2022 0307   AST 20 08/14/2022 0307   AST 15 08/08/2022 1028   ALT 28 08/14/2022 0307   ALT 15 08/08/2022 1028   ALKPHOS 63 08/14/2022 0307   BILITOT 0.7 08/14/2022 0307   BILITOT 1.3 (H) 08/08/2022 1028   GFRNONAA >60 08/16/2022 0234   GFRNONAA >60 08/08/2022 1028    Imaging: DG Chest Portable 1 View  Result Date: 08/27/2022 CLINICAL DATA:  Unwitnessed fall at home EXAM: PORTABLE CHEST 1 VIEW COMPARISON:  08/12/2022 FINDINGS: Interstitial coarsening from emphysema.  Left perihilar opacity which was airspace disease by CT. Vague nodular density in the right peripheral lung with adjacent fiducial marker. No hemothorax, pneumothorax, or lung contusion. Normal heart size and mediastinal contours IMPRESSION: No evidence of chest trauma. Stable compared to 08/12/2022 chest radiograph. Electronically Signed   By: Jorje Guild M.D.   On: 08/27/2022 04:58   DG Hips Bilat W or Wo Pelvis 3-4 Views  Result Date: 08/27/2022 CLINICAL DATA:  Unwitnessed fall at home with right-sided pain EXAM: DG HIP (WITH OR WITHOUT PELVIS) 4V BILAT COMPARISON:  None Available. FINDINGS: There is no evidence of hip fracture or dislocation. No hip joint narrowing or spurring. IMPRESSION: Negative. Electronically Signed   By: Jorje Guild M.D.   On: 08/27/2022 04:42   CT Head Wo Contrast  Result Date: 08/27/2022 CLINICAL DATA:  Unwitnessed fall at nursing home EXAM: CT HEAD WITHOUT CONTRAST CT CERVICAL SPINE WITHOUT CONTRAST TECHNIQUE: Multidetector CT imaging of the head and cervical spine was performed following the standard protocol without intravenous contrast. Multiplanar CT image reconstructions of the cervical spine were also generated. RADIATION DOSE REDUCTION: This exam was performed according to the departmental dose-optimization program which includes automated exposure control, adjustment of the mA and/or kV according to patient size and/or use of iterative reconstruction technique. COMPARISON:  08/07/2022 FINDINGS: CT HEAD FINDINGS Brain: Chronic infarcts in the left cerebellum and even more extensively in the left MCA territory with there is dystrophic mineralization at the parietal lobe. Stable chronic white matter disease. No acute infarct, hemorrhage, hydrocephalus, or collection. Vascular: No hyperdense vessel or unexpected calcification. Skull: Normal. Negative for fracture or focal lesion. Sinuses/Orbits: No acute finding. CT CERVICAL SPINE FINDINGS Alignment: Normal Skull base  and vertebrae: Nondisplaced transverse process fracture of C7 on the right. Soft tissues and spinal canal: No prevertebral fluid or swelling. No visible canal hematoma. Disc levels: Generalized spondylosis. Posterior endplate ridging most notable at C6-7. Mild ossification of the posterior longitudinal ligament. Upper chest: Biapical emphysema IMPRESSION: 1. Nondisplaced right transverse process fracture of C7. 2. No evidence of acute intracranial injury. 3. 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 Electronically Signed   By: Jorje Guild M.D.   On: 08/27/2022 04:14  CT Cervical Spine Wo Contrast  Result Date: 08/27/2022 CLINICAL DATA:  Unwitnessed fall at nursing home EXAM: CT HEAD WITHOUT CONTRAST CT CERVICAL SPINE WITHOUT CONTRAST TECHNIQUE: Multidetector CT imaging of the head and cervical spine was performed following the standard protocol without intravenous contrast. Multiplanar CT image reconstructions of the cervical spine were also generated. RADIATION DOSE REDUCTION: This exam was performed according to the departmental dose-optimization program which includes automated exposure control, adjustment of the mA and/or kV according to patient size and/or use of iterative reconstruction technique. COMPARISON:  08/07/2022 FINDINGS: CT HEAD FINDINGS Brain: Chronic infarcts in the left cerebellum and even more extensively in the left MCA territory with there is dystrophic mineralization at the parietal lobe. Stable chronic white matter disease. No acute infarct, hemorrhage, hydrocephalus, or collection. Vascular: No hyperdense vessel or unexpected calcification. Skull: Normal. Negative for fracture or focal lesion. Sinuses/Orbits: No acute finding. CT CERVICAL SPINE FINDINGS Alignment: Normal Skull base and vertebrae: Nondisplaced transverse process fracture of C7 on the right. Soft tissues and spinal canal: No prevertebral fluid or swelling. No visible canal hematoma. Disc levels: Generalized  spondylosis. Posterior endplate ridging most notable at C6-7. Mild ossification of the posterior longitudinal ligament. Upper chest: Biapical emphysema IMPRESSION: 1. Nondisplaced right transverse process fracture of C7. 2. No evidence of acute intracranial injury. 3. 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 Electronically Signed   By: Jorje Guild M.D.   On: 08/27/2022 04:14      EKG: personally reviewed my interpretation is prolonged QRS and QT interval unchanged from prior ECG.  ASSESSMENT & PLAN:   Assessment & Plan by Problem: Active Problems:   * No active hospital problems. *   Todd Estrada is a 61 y.o. 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 It remains unclear whether the patient sustained a mechanical or syncopal fall and whether any trauma was delivered to his head. He denied lightheadedness, chest pain, dizziness, and palpitations but reports experiencing a warm sensation prior to his fall. CT revealed nondisplaced transverse fracture at C7. Chest and pelvic radiographs negative for fractures. Given patient's history of seizure, diagnostic workup should include both syncopal and neurogenic etiologies. > Consult neurosurgery > Lidocaine patch 5% q24 > Oxycodone 5mg  q4 PRN > Cervical collar hard > Cardiac telemetry > Diet NPO > Echocardiograph with bubble study > Electroencephalogram > Orthostatic vital signs > Physical-occupational-speech therapy evaluation   #Necrotizing pneumonia #Lung squamous cell carcinoma stage IV Patient was  recently hospitalized on 8-25 for sepsis secondary to necrotizing pneumonia. He was treated with multiple antibiotics including ceftriaxone, cefepime, azithromycin, and piperacillin-tazobactam. Eventually, he was transitioned to amoxicillin-clavulanate and discharged with a five day course of this medication. It is unclear whether he has been taking his medications at home due to his severe dysarthria. On exam, patient is afebrile without any pulmonary signs of acute infection and he denies any new respiratory symptoms including cough. > Albuterol 2.5mg  q4 PRN > Arformoterol 15ug q12 and umeclidinium bromide 62.5ug q24 > Budesonide 0.5mg  D66 > Folic acid 1mg  q24 > Hold antibiotics at this  time given normal vitals and no signs of acute infection   #Chronic obstructive pulmonary disease Patient has a history of tobacco use. During his recent hospitalization on 8-25, he was given five-day course of prednisone and discharged on home albuterol, budesonide, and tiotropium bromide-olodaterol. Chronic and currently stable. > Nicotine patch 14mg  q24 > Supplemental oxygen to maintain SpO2 goal >92%   #Chronic combined systolic and diastolic heart failure Most recent echocardiogram on 3-16 demonstrated EF 40%. He has not recently experienced any acute heart failure exacerbations and is currently euvolemic on exam. > In-out monitoring and daily weights   #Cerebrovascular accident with dysarthria, aphasia, and right hemiparesis #History of seizures Patient is severely dysarthric with significant right-sided hemiparesis at baseline. During last admission on 8-25, PT-OT recommended SNF placement upon discharge but patient's cousin and caretaker states that 24-hour assistance is available at home. > Atorvastatin 40mg  q24 > Levetiracetam 500mg  IV q12      Diet: NPO VTE: SCDs IVF: None,None Code: DNR  Prior to Admission Living Arrangement: Home, living with cousin Brodie Correll Anticipated Discharge  Location: TBD Barriers to Discharge: medical management, diagnostic workup, discharge destination  Dispo: Admit patient to Observation with expected length of stay less than 2 midnights.   Signed: Serita Butcher, MD Internal Medicine Resident PGY-1  08/27/2022, 5:34 AM

## 2022-08-27 NOTE — ED Notes (Signed)
The pt  is c/o pain in his rt ribs

## 2022-08-27 NOTE — Procedures (Signed)
Patient Name: Todd Estrada  MRN: [id number]  Epilepsy Attending: Lora Havens  Referring Physician/Provider: [name], MD  Date: 08/27/2022 Duration: 22.21 mins  Patient history: 61 y.o. 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 EEG to evaluate for seizure  Level of alertness: Awake  AEDs during EEG study: LEV  Technical aspects: This EEG study was done with scalp electrodes positioned according to the 10-20 International system of electrode placement. Electrical activity was reviewed with band pass filter of 1-70Hz , sensitivity of 7 uV/mm, display speed of 37mm/sec with a 60Hz  notched filter applied as appropriate. EEG data were recorded continuously and digitally stored.  Video monitoring was available and reviewed as appropriate.  Description: The posterior dominant rhythm consists of 8 Hz activity of moderate voltage (25-35 uV) seen predominantly in posterior head regions, symmetric and reactive to eye opening and eye closing. EEG showed continuous 3 to 6 Hz theta-delta slowing in left hemisphere. Hyperventilation and photic stimulation were not performed.     ABNORMALITY - Continuous slow, left hemisphere  IMPRESSION: This study is suggestive of cortical dysfunction arising from left hemisphere likely secondary to underlying structural abnormality/stroke. No seizures or epileptiform discharges were seen throughout the recording.  Angelika Jerrett Barbra Sarks

## 2022-08-27 NOTE — Evaluation (Signed)
Clinical/Bedside Swallow Evaluation Patient Details  Name: Todd Estrada MRN: [id number] Date of Birth: 1961/04/05  Today's Date: 08/27/2022 Time: SLP Start Time (ACUTE ONLY): 1340 SLP Stop Time (ACUTE ONLY): 1400 SLP Time Calculation (min) (ACUTE ONLY): 20 min  Past Medical History:  Past Medical History:  Diagnosis Date   Asthma    COPD (chronic obstructive pulmonary disease) (HCC)    GERD (gastroesophageal reflux disease)    History of tracheostomy    03/09/22-04/11/22   HLD (hyperlipidemia)    Hypertension    Lung cancer ([place])    PAD (peripheral artery disease) ([place])    Seizures ([place]) 06/02/2022   Stroke ([place]) 02/2022   Past Surgical History:  Past Surgical History:  Procedure Laterality Date   BRONCHIAL BIOPSY  05/30/2022   Procedure: BRONCHIAL BIOPSIES;  Surgeon: Collene Gobble, MD;  Location: [facility] ENDOSCOPY;  Service: Pulmonary;;   BRONCHIAL BRUSHINGS  05/30/2022   Procedure: BRONCHIAL BRUSHINGS;  Surgeon: Collene Gobble, MD;  Location: [facility] ENDOSCOPY;  Service: Pulmonary;;   BRONCHIAL NEEDLE ASPIRATION BIOPSY  05/30/2022   Procedure: BRONCHIAL NEEDLE ASPIRATION BIOPSIES;  Surgeon: Collene Gobble, MD;  Location: [facility] ENDOSCOPY;  Service: Pulmonary;;   BRONCHIAL WASHINGS  05/30/2022   Procedure: BRONCHIAL WASHINGS;  Surgeon: Collene Gobble, MD;  Location: [facility] ENDOSCOPY;  Service: Pulmonary;;   ESOPHAGOGASTRODUODENOSCOPY (EGD) WITH PROPOFOL N/A 03/11/2022   Procedure: ESOPHAGOGASTRODUODENOSCOPY (EGD) WITH PROPOFOL;  Surgeon: Georganna Skeans, MD;  Location: [place];  Service: General;  Laterality: N/A;   FIDUCIAL MARKER PLACEMENT  05/30/2022   Procedure: FIDUCIAL MARKER PLACEMENT;  Surgeon: Collene Gobble, MD;  Location: [facility] ENDOSCOPY;  Service: Pulmonary;;   IR ANGIO INTRA EXTRACRAN SEL COM CAROTID INNOMINATE UNI R MOD SED  03/02/2022   IR CT HEAD LTD  03/02/2022   IR PERCUTANEOUS ART THROMBECTOMY/INFUSION INTRACRANIAL INC DIAG ANGIO  03/02/2022   PEG PLACEMENT N/A 03/11/2022    Procedure: PERCUTANEOUS ENDOSCOPIC GASTROSTOMY (PEG) PLACEMENT;  Surgeon: Georganna Skeans, MD;  Location: [place];  Service: General;  Laterality: N/A;   RADIOLOGY WITH ANESTHESIA N/A 03/02/2022   Procedure: IR WITH ANESTHESIA;  Surgeon: Luanne Bras, MD;  Location: [place];  Service: Radiology;  Laterality: N/A;   VIDEO BRONCHOSCOPY WITH RADIAL ENDOBRONCHIAL ULTRASOUND  05/30/2022   Procedure: VIDEO BRONCHOSCOPY WITH RADIAL ENDOBRONCHIAL ULTRASOUND;  Surgeon: Collene Gobble, MD;  Location: [facility] ENDOSCOPY;  Service: Pulmonary;;   HPI:  Patient is a 61 y.o. male with PMH: recent CVA with AIR stay at [facility] and residual dysphagia, aphasia and hemiplegia of right side, necrotizing PNA, COPD, CHF, lung cancer stage IV, seizures. He was admitted to [facility] ED on 08/27/22 following an unwitnessed fall at home. 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 Portable   CXR did not show any evidence of chest trauma; CT chest with contrast ordered but not completed yet.    Assessment / Plan / Recommendation  Clinical Impression  Patient presenting with clinical s/s of dysphagia as per this bedside/clinical swallow evaluation. SLP suspects that patient has not likely had any significant change in his swallow function since evaluation and treatment results during admission end of last month. During today's evaluation, patient was awake and alert with cervical collar in place. He is known to this SLP from previous admission and he does appear consistent with his "yeah" and "no/nah" he is unintelligible for all other utterances. He did acknowledge that  he has been eating softer foods at home but denied any thickening of liquids as had been done during last admission. SLP observed patient with cup and straw sips of thin liquids and puree solids. He exhibited anterior spillage on right side with puree solids  (of note he was 'drinking' it, not usinjg a spoon). No overt s/s of aspiration or penetration observed during or after puree solids or thin liquids. SLP recommending initiate Dys 1 (puree) solids, thin liquids and will follow for toleration. SLP Visit Diagnosis: Dysphagia, unspecified (R13.10)    Aspiration Risk  Mild aspiration risk    Diet Recommendation Dysphagia 1 (Puree);Thin liquid   Liquid Administration via: Cup;Straw Medication Administration: Crushed with puree Supervision: Full supervision/cueing for compensatory strategies;Staff to assist with self feeding Compensations: Slow rate;Small sips/bites;Minimize environmental distractions Postural Changes: Seated upright at 90 degrees    Other  Recommendations Oral Care Recommendations: Oral care BID    Recommendations for follow up therapy are one component of a multi-disciplinary discharge planning process, led by the attending physician.  Recommendations may be updated based on patient status, additional functional criteria and insurance authorization.  Follow up Recommendations Follow physician's recommendations for discharge plan and follow up therapies      Assistance Recommended at Discharge Frequent or constant Supervision/Assistance  Functional Status Assessment Patient has had a recent decline in their functional status and demonstrates the ability to make significant improvements in function in a reasonable and predictable amount of time.  Frequency and Duration min 2x/week  1 week       Prognosis Prognosis for Safe Diet Advancement: Good Barriers to Reach Goals: Time post onset;Severity of deficits      Swallow Study   General Date of Onset: 08/27/22 HPI: Patient is a 61 y.o. male with PMH: recent CVA with AIR stay at [facility] and residual dysphagia, aphasia and hemiplegia of right side, necrotizing PNA, COPD, CHF, lung cancer stage IV, seizures. He was admitted to [facility] ED on 08/27/22 following an unwitnessed fall at  home. 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 Portable   CXR did not show any evidence of chest trauma; CT chest with contrast ordered but not completed yet. Type of Study: Bedside Swallow Evaluation Previous Swallow Assessment: BSE during admission last month, MBS x2 in April of 2023 while in AIR at [facility] Diet Prior to this Study: NPO Temperature Spikes Noted: No Respiratory Status: Room air History of Recent Intubation: No Behavior/Cognition: Alert;Cooperative;Pleasant mood Oral Cavity Assessment: Within Functional Limits Oral Care Completed by SLP: No Oral Cavity - Dentition: Edentulous Vision: Functional for self-feeding Self-Feeding Abilities: Able to feed self;Needs assist;Needs set up Patient Positioning: Upright in bed Baseline Vocal Quality: Normal Volitional Cough: Cognitively unable to elicit Volitional Swallow: Able to elicit    Oral/Motor/Sensory Function Overall Oral Motor/Sensory Function: Mild impairment Facial ROM: Reduced right Facial Strength: Reduced right Lingual Symmetry: Within Functional Limits Velum: Within Functional Limits Mandible: Within Functional Limits   Ice Chips     Thin Liquid Thin Liquid: Impaired Presentation: Cup;Straw Pharyngeal  Phase Impairments: Suspected delayed Swallow;Other (comments) (no overt s/s aspiration or penetration)    Nectar Thick Nectar Thick Liquid: Not tested   Honey Thick Honey Thick Liquid: Not tested   Puree Puree: Impaired Oral Phase Functional Implications: Right anterior spillage Pharyngeal Phase Impairments: Suspected delayed Swallow   Solid     Solid: Not tested  Sonia Baller, MA, CCC-SLP Speech Therapy

## 2022-08-27 NOTE — ED Provider Notes (Signed)
[facility] EMERGENCY DEPARTMENT Provider Note   CSN: [id number] Arrival date & time: 08/27/22  0319     History  Chief Complaint  Patient presents with   Todd Estrada is a 61 y.o. male.  The history is provided by the patient.  Fall This is a new problem. The problem occurs constantly. Pertinent negatives include no abdominal pain, no headaches and no shortness of breath. Nothing aggravates the symptoms. Nothing relieves the symptoms. He has tried nothing for the symptoms. The treatment provided no relief.  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       Home Medications Prior to Admission medications   Medication Sig Start Date End Date Taking? Authorizing Provider  albuterol (PROVENTIL) (2.5 MG/3ML) 0.083% nebulizer solution Take 3 mLs (2.5 mg total) by nebulization every 4 (four) hours as needed for wheezing or shortness of breath. 08/16/22   Lacinda Axon, MD  atorvastatin (LIPITOR) 40 MG tablet Take 1 tablet (40 mg total) by mouth daily. 05/25/22 05/25/23  Lacinda Axon, MD  budesonide (PULMICORT) 0.5 MG/2ML nebulizer solution USE 2 ML(0.5 MG) VIA NEBULIZER TWICE DAILY Patient taking differently: Take 0.5 mg by nebulization 2 (two) times daily. 06/27/22   Freddi Starr, MD  clopidogrel (PLAVIX) 75 MG tablet Take 1 tablet (75 mg total) by mouth daily. Okay to restart this medication on 05/31/2022 Patient taking differently: Take 75 mg by mouth daily. 06/28/22   Gaylan Gerold, DO  folic acid (FOLVITE) 1 MG tablet Take 1 tablet (1 mg total) by mouth daily. 05/09/22   Maudie Mercury, MD  levETIRAcetam (KEPPRA) 500 MG tablet Take 1 tablet (500 mg total) by mouth 2 (two) times daily. 06/02/22   Marcial Pacas, MD  Multiple Vitamin (MULTIVITAMIN WITH MINERALS) TABS tablet Take 1 tablet by mouth daily. 05/19/22   Maudie Mercury, MD  nicotine (NICODERM CQ - DOSED IN MG/24 HOURS) 14 mg/24hr patch Place 1 patch (14 mg total) onto the skin daily. 07/22/22    Atway, Rayann N, DO  ondansetron (ZOFRAN) 4 MG tablet Take 4 mg by mouth every 8 (eight) hours as needed for nausea or vomiting.    [provider]  STIOLTO RESPIMAT 2.5-2.5 MCG/ACT AERS INHALE 2 PUFFS INTO THE LUNGS DAILY 06/17/22   Freddi Starr, MD      Allergies    Patient has no known allergies.    Review of Systems   Review of Systems  Respiratory:  Negative for shortness of breath.   Gastrointestinal:  Negative for abdominal pain.  Neurological:  Negative for headaches.    Physical Exam Updated Vital Signs BP (!) 89/74   Pulse 88   Temp 98.5 F (36.9 C) (Oral)   Resp (!) 24   Ht 6' (1.829 m)   Wt 74.8 kg   SpO2 97%   BMI 22.38 kg/m  Physical Exam Vitals and nursing note reviewed.  Constitutional:      General: He is not in acute distress.    Appearance: He is well-developed. He is not diaphoretic.  HENT:     Head: Normocephalic and atraumatic.     Right Ear: Tympanic membrane normal.     Left Ear: Tympanic membrane normal.     Nose: Nose normal.     Mouth/Throat:     Mouth: Mucous membranes are dry.     Comments: Dry  Eyes:     Conjunctiva/sclera: Conjunctivae normal.     Pupils: Pupils are equal, round, and  reactive to light.  Cardiovascular:     Rate and Rhythm: Normal rate and regular rhythm.     Pulses: Normal pulses.     Heart sounds: Normal heart sounds.  Pulmonary:     Effort: Pulmonary effort is normal.     Breath sounds: Rhonchi and rales present. No wheezing.  Abdominal:     General: Bowel sounds are normal.     Palpations: Abdomen is soft.     Tenderness: There is no abdominal tenderness. There is no guarding or rebound.     Comments: Pelvic is stable   Musculoskeletal:        General: No deformity. Normal range of motion.     Right wrist: No bony tenderness.     Right hand: Normal.     Left hand: Normal.     Cervical back: Normal range of motion. Tenderness present.     Right ankle: Normal.     Right Achilles Tendon:  Normal.     Left ankle: Normal.     Left Achilles Tendon: Normal.     Right foot: Normal.     Left foot: Normal.  Skin:    General: Skin is warm and dry.  Neurological:     Mental Status: He is alert and oriented to person, place, and time.     ED Results / Procedures / Treatments   Labs (all labs ordered are listed, but only abnormal results are displayed) Results for orders placed or performed during the hospital encounter of 08/27/22  CBC with Differential  Result Value Ref Range   WBC 8.5 4.0 - 10.5 K/uL   RBC 3.35 (L) 4.22 - 5.81 MIL/uL   Hemoglobin 10.2 (L) 13.0 - 17.0 g/dL   HCT 32.2 (L) 39.0 - 52.0 %   MCV 96.1 80.0 - 100.0 fL   MCH 30.4 26.0 - 34.0 pg   MCHC 31.7 30.0 - 36.0 g/dL   RDW 16.4 (H) 11.5 - 15.5 %   Platelets 424 (H) 150 - 400 K/uL   nRBC 0.0 0.0 - 0.2 %   Neutrophils Relative % 87 %   Neutro Abs 7.3 1.7 - 7.7 K/uL   Lymphocytes Relative 6 %   Lymphs Abs 0.5 (L) 0.7 - 4.0 K/uL   Monocytes Relative 7 %   Monocytes Absolute 0.6 0.1 - 1.0 K/uL   Eosinophils Relative 0 %   Eosinophils Absolute 0.0 0.0 - 0.5 K/uL   Basophils Relative 0 %   Basophils Absolute 0.0 0.0 - 0.1 K/uL   Immature Granulocytes 0 %   Abs Immature Granulocytes 0.03 0.00 - 0.07 K/uL  Urinalysis, Routine w reflex microscopic Urine, Clean Catch  Result Value Ref Range   Color, Urine YELLOW YELLOW   APPearance HAZY (A) CLEAR   Specific Gravity, Urine 1.019 1.005 - 1.030   pH 5.0 5.0 - 8.0   Glucose, UA NEGATIVE NEGATIVE mg/dL   Hgb urine dipstick NEGATIVE NEGATIVE   Bilirubin Urine NEGATIVE NEGATIVE   Ketones, ur NEGATIVE NEGATIVE mg/dL   Protein, ur 30 (A) NEGATIVE mg/dL   Nitrite NEGATIVE NEGATIVE   Leukocytes,Ua NEGATIVE NEGATIVE   RBC / HPF 0-5 0 - 5 RBC/hpf   WBC, UA 0-5 0 - 5 WBC/hpf   Bacteria, UA NONE SEEN NONE SEEN   Squamous Epithelial / LPF 0-5 0 - 5   Mucus PRESENT   I-stat chem 8, ED (not at [facility] or [facility])  Result Value Ref Range   Sodium 135 135 - 145 mmol/L  Potassium 3.7 3.5 - 5.1 mmol/L   Chloride 98 98 - 111 mmol/L   BUN 16 8 - 23 mg/dL   Creatinine, Ser 0.80 0.61 - 1.24 mg/dL   Glucose, Bld 110 (H) 70 - 99 mg/dL   Calcium, Ion 0.92 (L) 1.15 - 1.40 mmol/L   TCO2 27 22 - 32 mmol/L   Hemoglobin 10.5 (L) 13.0 - 17.0 g/dL   HCT 31.0 (L) 39.0 - 52.0 %   DG Chest Portable 1 View  Result Date: 08/27/2022 CLINICAL DATA:  Unwitnessed fall at home EXAM: PORTABLE CHEST 1 VIEW COMPARISON:  08/12/2022 FINDINGS: Interstitial coarsening from emphysema. Left perihilar opacity which was airspace disease by CT. Vague nodular density in the right peripheral lung with adjacent fiducial marker. No hemothorax, pneumothorax, or lung contusion. Normal heart size and mediastinal contours IMPRESSION: No evidence of chest trauma. Stable compared to 08/12/2022 chest radiograph. Electronically Signed   By: Jorje Guild M.D.   On: 08/27/2022 04:58   DG Hips Bilat W or Wo Pelvis 3-4 Views  Result Date: 08/27/2022 CLINICAL DATA:  Unwitnessed fall at home with right-sided pain EXAM: DG HIP (WITH OR WITHOUT PELVIS) 4V BILAT COMPARISON:  None Available. FINDINGS: There is no evidence of hip fracture or dislocation. No hip joint narrowing or spurring. IMPRESSION: Negative. Electronically Signed   By: Jorje Guild M.D.   On: 08/27/2022 04:42   CT Head Wo Contrast  Result Date: 08/27/2022 CLINICAL DATA:  Unwitnessed fall at nursing home EXAM: CT HEAD WITHOUT CONTRAST CT CERVICAL SPINE WITHOUT CONTRAST TECHNIQUE: Multidetector CT imaging of the head and cervical spine was performed following the standard protocol without intravenous contrast. Multiplanar CT image reconstructions of the cervical spine were also generated. RADIATION DOSE REDUCTION: This exam was performed according to the departmental dose-optimization program which includes automated exposure control, adjustment of the mA and/or kV according to patient size and/or use of iterative reconstruction technique.  COMPARISON:  08/07/2022 FINDINGS: CT HEAD FINDINGS Brain: Chronic infarcts in the left cerebellum and even more extensively in the left MCA territory with there is dystrophic mineralization at the parietal lobe. Stable chronic white matter disease. No acute infarct, hemorrhage, hydrocephalus, or collection. Vascular: No hyperdense vessel or unexpected calcification. Skull: Normal. Negative for fracture or focal lesion. Sinuses/Orbits: No acute finding. CT CERVICAL SPINE FINDINGS Alignment: Normal Skull base and vertebrae: Nondisplaced transverse process fracture of C7 on the right. Soft tissues and spinal canal: No prevertebral fluid or swelling. No visible canal hematoma. Disc levels: Generalized spondylosis. Posterior endplate ridging most notable at C6-7. Mild ossification of the posterior longitudinal ligament. Upper chest: Biapical emphysema IMPRESSION: 1. Nondisplaced right transverse process fracture of C7. 2. No evidence of acute intracranial injury. 3. 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 Electronically Signed   By: Jorje Guild M.D.   On: 08/27/2022 04:14   CT Cervical Spine Wo Contrast  Result Date: 08/27/2022 CLINICAL DATA:  Unwitnessed fall at nursing home EXAM: CT HEAD WITHOUT CONTRAST CT CERVICAL SPINE WITHOUT CONTRAST TECHNIQUE: Multidetector CT imaging of the head and cervical spine was performed following the standard protocol without intravenous contrast. Multiplanar CT image reconstructions of the cervical spine were also generated. RADIATION DOSE REDUCTION: This exam was performed according to the departmental dose-optimization program which includes automated exposure control, adjustment of the mA and/or kV according to patient size and/or use of iterative reconstruction technique. COMPARISON:  08/07/2022 FINDINGS: CT HEAD FINDINGS Brain: Chronic infarcts in the left cerebellum and even more extensively in the left  MCA territory with there is dystrophic mineralization at the  parietal lobe. Stable chronic white matter disease. No acute infarct, hemorrhage, hydrocephalus, or collection. Vascular: No hyperdense vessel or unexpected calcification. Skull: Normal. Negative for fracture or focal lesion. Sinuses/Orbits: No acute finding. CT CERVICAL SPINE FINDINGS Alignment: Normal Skull base and vertebrae: Nondisplaced transverse process fracture of C7 on the right. Soft tissues and spinal canal: No prevertebral fluid or swelling. No visible canal hematoma. Disc levels: Generalized spondylosis. Posterior endplate ridging most notable at C6-7. Mild ossification of the posterior longitudinal ligament. Upper chest: Biapical emphysema IMPRESSION: 1. Nondisplaced right transverse process fracture of C7. 2. No evidence of acute intracranial injury. 3. 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 Electronically Signed   By: Jorje Guild M.D.   On: 08/27/2022 04:14   CT Angio Chest PE W and/or Wo Contrast  Result Date: 08/12/2022 CLINICAL DATA:  Pulmonary embolism (PE) suspected, high prob. Lung cancer, dysphagia. EXAM: CT ANGIOGRAPHY CHEST WITH CONTRAST TECHNIQUE: Multidetector CT imaging of the chest was performed using the standard protocol during bolus administration of intravenous contrast. Multiplanar CT image reconstructions and MIPs were obtained to evaluate the vascular anatomy. RADIATION DOSE REDUCTION: This exam was performed according to the departmental dose-optimization program which includes automated exposure control, adjustment of the mA and/or kV according to patient size and/or use of iterative reconstruction technique. CONTRAST:  51mL OMNIPAQUE IOHEXOL 350 MG/ML SOLN COMPARISON:  08/10/2022 FINDINGS: Cardiovascular: Adequate opacification of the pulmonary arterial tree. Stable web within the posterior basal segmental pulmonary artery of the left lower lobe in keeping with chronic pulmonary embolism. No intraluminal filling defect identified to suggest acute pulmonary  embolism. Central pulmonary arteries are of normal caliber. Mild multi-vessel coronary artery calcification. Global cardiac size within normal limits. No pericardial effusion. Mild mixed atherosclerotic plaque within the thoracic aorta. No aortic aneurysm. Mediastinum/Nodes: Visualized thyroid is unremarkable. No pathologic thoracic adenopathy. Esophagus is unremarkable. Lungs/Pleura: Moderate centrilobular emphysema again identified with stable pulmonary hyperinflation. Mild left basilar dependent atelectasis, unchanged. Marked interval progression of airspace infiltrate within the left upper lobe anteriorly with areas of more focal consolidation medially demonstrating progressive parenchymal cavitation in keeping with necrotizing infection. This partially encompasses the left upper lobe mixed solid and cystic pulmonary mass, which measures 2.8 x 3.3 cm at axial image # 68/8. There is extensive asymmetric bronchial wall thickening involving the left mainstem bronchus and lobar and segmental bronchi of the left upper lobe and left lower lobe, unchanged from prior examination which may reflect changes related to neoplastic infiltration, asymmetric bronchial inflammation, or a combination of these processes. This appears stable since prior examination. Mild airway impaction within the left lower lobe. There is diffuse bronchial wall thickening again noted in keeping with airway inflammation, stable. Spiculated mixed solid and cystic mass within the posterior segment the right upper lobe measuring 2.1 x 2.8 cm at axial image # 66/8 with multiple metallic fiducial markers in place is unchanged. Sparse ground-glass infiltrate has developed within the right lower lobe which may relate to acute infection or aspiration. No pneumothorax or pleural effusion. Upper Abdomen: No acute abnormality. Musculoskeletal: No acute bone abnormality. No lytic or blastic bone lesion. Review of the MIP images confirms the above findings.  IMPRESSION: 1. No acute pulmonary embolism. Stable chronic pulmonary embolism within the left lower lobe. 2. Marked interval progression of airspace infiltrate within the left upper lobe with areas of more focal consolidation medially demonstrating progressive parenchymal cavitation in keeping with necrotizing infection. 3. Stable mixed  solid and cystic pulmonary mass within the left upper lobe, partially encompassed by the above-mentioned inflammatory process. 4. Stable asymmetric bronchial wall thickening involving the left mainstem bronchus and lobar and segmental bronchi of the left upper lobe and left lower lobe which may reflect changes related to neoplastic infiltration, asymmetric bronchial inflammation, or a combination of these processes. 5. Stable spiculated mixed solid and cystic mass within the right upper lobe with fiducial markers in place. 6. Interval development of mild ground-glass infiltrate within the right lower lobe which may relate to acute infection or aspiration. 7. Moderate centrilobular emphysema. 8. Mild multi-vessel coronary artery calcification. Aortic Atherosclerosis (ICD10-I70.0) and Emphysema (ICD10-J43.9). Electronically Signed   By: Fidela Salisbury M.D.   On: 08/12/2022 23:09   DG Chest Portable 1 View  Result Date: 08/12/2022 CLINICAL DATA:  Chest pain. EXAM: PORTABLE CHEST 1 VIEW COMPARISON:  August 07, 2022 FINDINGS: The heart size and mediastinal contours are within normal limits. A stable, ill-defined left hilar opacity is seen. A small solitary radiopaque marker is seen overlying the mid right lung. This area is partially obscured secondary to the presence of an overlying radiopaque cardiac lead. Mild linear atelectasis is seen along the periphery of the right lung base. There is mild, stable elevation of the left hemidiaphragm. No pleural effusion or pneumothorax is identified. The visualized skeletal structures are unremarkable. IMPRESSION: Stable, ill-defined left hilar  opacity consistent with the patient's known biopsy-proven malignancy. Electronically Signed   By: Virgina Norfolk M.D.   On: 08/12/2022 21:42   CT Angio Chest Pulmonary Embolism (PE) W or WO Contrast  Result Date: 08/07/2022 CLINICAL DATA:  Shortness of breath. Lung masses, reportedly biopsy-proven malignancy. EXAM: CT ANGIOGRAPHY CHEST WITH CONTRAST TECHNIQUE: Multidetector CT imaging of the chest was performed using the standard protocol during bolus administration of intravenous contrast. Multiplanar CT image reconstructions and MIPs were obtained to evaluate the vascular anatomy. RADIATION DOSE REDUCTION: This exam was performed according to the departmental dose-optimization program which includes automated exposure control, adjustment of the mA and/or kV according to patient size and/or use of iterative reconstruction technique. CONTRAST:  71mL OMNIPAQUE IOHEXOL 350 MG/ML SOLN COMPARISON:  05/27/2022. FINDINGS: Cardiovascular: Pulmonary arteries are well opacified. Some motion artifact limits assessment of the smaller segmental branches in the lower lungs, particularly on the left. Allowing for this limitation, there is a possible small pulmonary embolus in the posterior basilar segment branch to the left lower lobe, which may be a small chronic pulmonary embolism. This is equivocal. There is no other evidence of a pulmonary embolism. Heart is normal in size and configuration. No pericardial effusion. Three-vessel coronary artery calcifications. Great vessels are normal in caliber. No aortic dissection. Aortic atherosclerosis. Mediastinum/Nodes: No neck base or mediastinal masses or enlarged lymph nodes. Abnormal soft tissue surrounds the left upper lobe, anterior segmental bronchus extending from the hilum, narrowing the associated pulmonary artery, contiguous with a heterogeneous left upper lobe mass with spiculated margins. No right hilar mass or adenopathy. Trachea and esophagus are unremarkable.  Lungs/Pleura: Heterogeneous left upper lobe mass with spiculated margins measures 3.6 x 2.9 x 2.9 cm, mildly decreased in size from the previous CT, now containing areas of air attenuation. This is centered on image 40, series 11. Posterolateral right upper lobe nodule, image 72, series 11, 2.2 x 1.6 cm, containing foci of air as well as metallic foci. This is similar to the prior exam, a lest solid appearance on the current exam. Patchy airspace opacity is noted in the  anteromedial left upper lobe, developing since the previous chest CT. Hazy opacities noted in the posterior left lower lobe, associated with bronchial wall thickening, consistent with atelectasis. Mild linear atelectasis and/or scarring is noted at the right lung base, stable. Advanced emphysema.  No pleural effusion or pneumothorax. Upper Abdomen: No acute findings. Musculoskeletal: No fracture or acute finding. No bone lesion. No chest wall mass. Review of the MIP images confirms the above findings. IMPRESSION: 1. There is a possible single small segmental branch, nonocclusive and likely chronic, pulmonary embolism to the left lower lobe. 2. No other evidence of a pulmonary embolism. No evidence of an acute pulmonary embolism. 3. New patchy opacity in the anteromedial left upper lobe. This may be infectious/inflammatory in etiology. 4. Left and right upper lobe lesions, left a mass, right a nodule, left smaller than on the prior CT, both less solid in appearance with more internal cystic change. 5. No new lung masses or nodules. 6. Stable changes of advanced emphysema. Aortic Atherosclerosis (ICD10-I70.0) and Emphysema (ICD10-J43.9). Electronically Signed   By: Lajean Manes M.D.   On: 08/07/2022 09:28   CT HEAD WO CONTRAST (5MM)  Result Date: 08/07/2022 CLINICAL DATA:  Neck trauma. EXAM: CT HEAD WITHOUT CONTRAST CT CERVICAL SPINE WITHOUT CONTRAST TECHNIQUE: Multidetector CT imaging of the head and cervical spine was performed following the  standard protocol without intravenous contrast. Multiplanar CT image reconstructions of the cervical spine were also generated. RADIATION DOSE REDUCTION: This exam was performed according to the departmental dose-optimization program which includes automated exposure control, adjustment of the mA and/or kV according to patient size and/or use of iterative reconstruction technique. COMPARISON:  05/12/2022 FINDINGS: CT HEAD FINDINGS Brain: No evidence of acute infarction, hemorrhage, hydrocephalus, extra-axial collection or mass lesion/mass effect. Large area of encephalomalacia is again identified within the left middle cerebral artery territory. Laminar calcifications identified within the area of encephalomalacia identified, also unchanged. Remote left cerebellar hemisphere infarcts are also unchanged from previous exam. There is mild diffuse low-attenuation within the subcortical and periventricular white matter compatible with chronic microvascular disease. Prominence of the sulci and ventricles compatible with brain atrophy. Vascular: No hyperdense vessel or unexpected calcification. Skull: Normal. Negative for fracture or focal lesion. Sinuses/Orbits: There is asymmetric, partial opacification of the right maxillary sinus. Mild mucosal thickening noted in the left maxillary sinus. Other: None CT CERVICAL SPINE FINDINGS Alignment: The alignment of the cervical spine appears normal. No signs of posttraumatic malalignment. Skull base and vertebrae: No acute fracture. No primary bone lesion or focal pathologic process. Soft tissues and spinal canal: No prevertebral fluid or swelling. No visible canal hematoma. Disc levels: Multilevel cervical spondylosis identified. Disc space narrowing and ventral endplate spurring is identified involving C4-5 through C6-7. Upper chest: Emphysematous changes identified within the imaged portions of the lung bases. Calcified granuloma noted in the posterior right upper lobe. Other:  None IMPRESSION: 1. No acute intracranial abnormality. 2. Large area of encephalomalacia within the left middle cerebral artery territory with associated dystrophic calcifications is unchanged from previous exam. 3. Chronic left cerebellar hemisphere infarcts 4. Chronic small vessel ischemic disease and brain atrophy. 5. No evidence for cervical spine fracture or subluxation. 6. Cervical spondylosis. 7. Emphysema (ICD10-J43.9). Electronically Signed   By: Kerby Moors M.D.   On: 08/07/2022 08:40   CT CERVICAL SPINE WO CONTRAST  Result Date: 08/07/2022 CLINICAL DATA:  Neck trauma. EXAM: CT HEAD WITHOUT CONTRAST CT CERVICAL SPINE WITHOUT CONTRAST TECHNIQUE: Multidetector CT imaging of the head and cervical spine  was performed following the standard protocol without intravenous contrast. Multiplanar CT image reconstructions of the cervical spine were also generated. RADIATION DOSE REDUCTION: This exam was performed according to the departmental dose-optimization program which includes automated exposure control, adjustment of the mA and/or kV according to patient size and/or use of iterative reconstruction technique. COMPARISON:  05/12/2022 FINDINGS: CT HEAD FINDINGS Brain: No evidence of acute infarction, hemorrhage, hydrocephalus, extra-axial collection or mass lesion/mass effect. Large area of encephalomalacia is again identified within the left middle cerebral artery territory. Laminar calcifications identified within the area of encephalomalacia identified, also unchanged. Remote left cerebellar hemisphere infarcts are also unchanged from previous exam. There is mild diffuse low-attenuation within the subcortical and periventricular white matter compatible with chronic microvascular disease. Prominence of the sulci and ventricles compatible with brain atrophy. Vascular: No hyperdense vessel or unexpected calcification. Skull: Normal. Negative for fracture or focal lesion. Sinuses/Orbits: There is asymmetric,  partial opacification of the right maxillary sinus. Mild mucosal thickening noted in the left maxillary sinus. Other: None CT CERVICAL SPINE FINDINGS Alignment: The alignment of the cervical spine appears normal. No signs of posttraumatic malalignment. Skull base and vertebrae: No acute fracture. No primary bone lesion or focal pathologic process. Soft tissues and spinal canal: No prevertebral fluid or swelling. No visible canal hematoma. Disc levels: Multilevel cervical spondylosis identified. Disc space narrowing and ventral endplate spurring is identified involving C4-5 through C6-7. Upper chest: Emphysematous changes identified within the imaged portions of the lung bases. Calcified granuloma noted in the posterior right upper lobe. Other: None IMPRESSION: 1. No acute intracranial abnormality. 2. Large area of encephalomalacia within the left middle cerebral artery territory with associated dystrophic calcifications is unchanged from previous exam. 3. Chronic left cerebellar hemisphere infarcts 4. Chronic small vessel ischemic disease and brain atrophy. 5. No evidence for cervical spine fracture or subluxation. 6. Cervical spondylosis. 7. Emphysema (ICD10-J43.9). Electronically Signed   By: Kerby Moors M.D.   On: 08/07/2022 08:40   DG Chest Port 1 View  Result Date: 08/07/2022 CLINICAL DATA:  Shortness of breath.  Cough. EXAM: PORTABLE CHEST 1 VIEW COMPARISON:  05/30/2022 FINDINGS: Heart size and mediastinal contours are unremarkable. Asymmetric elevation of the left hemidiaphragm noted. No pleural effusion or edema. Left upper lobe perihilar lung mass is obscured by the left hilar structures. Lesion within the periphery of the right upper lobe containing fiducial marker is again noted. No superimposed pleural effusion or airspace consolidation. The visualized osseous structures are unremarkable. IMPRESSION: 1. No acute cardiopulmonary abnormalities. 2. No significant change in bilateral upper lobe  lesions consistent with biopsy proven malignancy. Electronically Signed   By: Kerby Moors M.D.   On: 08/07/2022 06:28     Radiology DG Chest Portable 1 View  Result Date: 08/27/2022 CLINICAL DATA:  Unwitnessed fall at home EXAM: PORTABLE CHEST 1 VIEW COMPARISON:  08/12/2022 FINDINGS: Interstitial coarsening from emphysema. Left perihilar opacity which was airspace disease by CT. Vague nodular density in the right peripheral lung with adjacent fiducial marker. No hemothorax, pneumothorax, or lung contusion. Normal heart size and mediastinal contours IMPRESSION: No evidence of chest trauma. Stable compared to 08/12/2022 chest radiograph. Electronically Signed   By: Jorje Guild M.D.   On: 08/27/2022 04:58   DG Hips Bilat W or Wo Pelvis 3-4 Views  Result Date: 08/27/2022 CLINICAL DATA:  Unwitnessed fall at home with right-sided pain EXAM: DG HIP (WITH OR WITHOUT PELVIS) 4V BILAT COMPARISON:  None Available. FINDINGS: There is no evidence of hip fracture or  dislocation. No hip joint narrowing or spurring. IMPRESSION: Negative. Electronically Signed   By: Jorje Guild M.D.   On: 08/27/2022 04:42   CT Head Wo Contrast  Result Date: 08/27/2022 CLINICAL DATA:  Unwitnessed fall at nursing home EXAM: CT HEAD WITHOUT CONTRAST CT CERVICAL SPINE WITHOUT CONTRAST TECHNIQUE: Multidetector CT imaging of the head and cervical spine was performed following the standard protocol without intravenous contrast. Multiplanar CT image reconstructions of the cervical spine were also generated. RADIATION DOSE REDUCTION: This exam was performed according to the departmental dose-optimization program which includes automated exposure control, adjustment of the mA and/or kV according to patient size and/or use of iterative reconstruction technique. COMPARISON:  08/07/2022 FINDINGS: CT HEAD FINDINGS Brain: Chronic infarcts in the left cerebellum and even more extensively in the left MCA territory with there is dystrophic  mineralization at the parietal lobe. Stable chronic white matter disease. No acute infarct, hemorrhage, hydrocephalus, or collection. Vascular: No hyperdense vessel or unexpected calcification. Skull: Normal. Negative for fracture or focal lesion. Sinuses/Orbits: No acute finding. CT CERVICAL SPINE FINDINGS Alignment: Normal Skull base and vertebrae: Nondisplaced transverse process fracture of C7 on the right. Soft tissues and spinal canal: No prevertebral fluid or swelling. No visible canal hematoma. Disc levels: Generalized spondylosis. Posterior endplate ridging most notable at C6-7. Mild ossification of the posterior longitudinal ligament. Upper chest: Biapical emphysema IMPRESSION: 1. Nondisplaced right transverse process fracture of C7. 2. No evidence of acute intracranial injury. 3. 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 Electronically Signed   By: Jorje Guild M.D.   On: 08/27/2022 04:14   CT Cervical Spine Wo Contrast  Result Date: 08/27/2022 CLINICAL DATA:  Unwitnessed fall at nursing home EXAM: CT HEAD WITHOUT CONTRAST CT CERVICAL SPINE WITHOUT CONTRAST TECHNIQUE: Multidetector CT imaging of the head and cervical spine was performed following the standard protocol without intravenous contrast. Multiplanar CT image reconstructions of the cervical spine were also generated. RADIATION DOSE REDUCTION: This exam was performed according to the departmental dose-optimization program which includes automated exposure control, adjustment of the mA and/or kV according to patient size and/or use of iterative reconstruction technique. COMPARISON:  08/07/2022 FINDINGS: CT HEAD FINDINGS Brain: Chronic infarcts in the left cerebellum and even more extensively in the left MCA territory with there is dystrophic mineralization at the parietal lobe. Stable chronic white matter disease. No acute infarct, hemorrhage, hydrocephalus, or collection. Vascular: No hyperdense vessel or unexpected calcification. Skull:  Normal. Negative for fracture or focal lesion. Sinuses/Orbits: No acute finding. CT CERVICAL SPINE FINDINGS Alignment: Normal Skull base and vertebrae: Nondisplaced transverse process fracture of C7 on the right. Soft tissues and spinal canal: No prevertebral fluid or swelling. No visible canal hematoma. Disc levels: Generalized spondylosis. Posterior endplate ridging most notable at C6-7. Mild ossification of the posterior longitudinal ligament. Upper chest: Biapical emphysema IMPRESSION: 1. Nondisplaced right transverse process fracture of C7. 2. No evidence of acute intracranial injury. 3. 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 Electronically Signed   By: Jorje Guild M.D.   On: 08/27/2022 04:14    Procedures Procedures    Medications Ordered in ED Medications  Ampicillin-Sulbactam (UNASYN) 3 g in sodium chloride 0.9 % 100 mL IVPB (has no administration in time range)  sodium chloride 0.9 % bolus 250 mL (has no administration in time range)  0.9 %  sodium chloride infusion (has no administration in time range)    ED Course/ Medical Decision Making/ A&P  Medical Decision Making Fall unwitnessed at home post discharge from necrotizing lung infection   Amount and/or Complexity of Data Reviewed Independent Historian: EMS    Details: See above  External Data Reviewed: radiology and notes.    Details: Previous admission notes and xrays reviewed  Labs: ordered.    Details: All labs reviewed:  normal white count 8.5, hemoglobin low 10.2 normal platelet count elevated 424K  Normal sodium 135 and potassium 3.7 and creatinine.   Radiology: ordered and independent interpretation performed.    Details: Lung infection appears unchanged from previous Xray.   Discussion of management or test interpretation with external provider(s): 520 Case d/w Dr. Kathyrn Sheriff:  place in collar for 6 weeks.  Follow up in office.    Risk Prescription drug management. Decision  regarding hospitalization.    Final Clinical Impression(s) / ED Diagnoses Final diagnoses:  Fall, initial encounter  Closed fracture of transverse process of cervical vertebra, initial encounter ([place])  Pneumonia due to infectious organism, unspecified laterality, unspecified part of lung  Hypotension, unspecified hypotension type   The patient appears reasonably stabilized for admission considering the current resources, flow, and capabilities available in the ED at this time, and I doubt any other [facility] requiring further screening and/or treatment in the ED prior to admission.  Rx / DC Orders ED Discharge Orders     None         Ambree Frances, MD 08/27/22 [phone]

## 2022-08-27 NOTE — ED Notes (Addendum)
a 

## 2022-08-27 NOTE — Discharge Instructions (Addendum)
Todd Estrada  It was a pleasure taking care of you at [facility] were admitted for further evaluation of a fall.  You also developed a stomach infection during your hospital stay.  Please follow the following instructions at home.  1) Regarding your diarrhea, this is likely due to an infection.  We are sending you home on oral antibiotics.  Please take these antibiotics for the next 8 days.  The antibiotics name as vancomycin.  Please take take vancomycin orally 125 mg 4 times a day for the next 8 days.  2) Regarding your fall, the neurosurgeon says you do not need to wear your c-collar nor do you need to follow-up.  Please take Tylenol as needed for pain.  3) regarding your low blood pressure during hospitalization, we started you on midodrine which helped keep your blood pressure elevated.  Please continue to take midodrine 2.5 mg 3 times daily outpatient.  4) Regarding your squamous cell lung cancer, please continue to go to your chemoradiation treatments and follow-up with your oncologist.  5) Please follow-up with Dr. Jodell Cipro at the internal medicine center on 09/22/223 at 9:45 AM.  6) For the right eye irritation, please get some antihistamine eye drops from your local pharmacy and you can get lidocaine patches over the counter as well. Please use warm compresses as needed for the right eye irritation.   Take care,  Leigh Aurora, DO

## 2022-08-27 NOTE — Hospital Course (Addendum)
Todd Estrada is a 61 y.o. male with a history of CVA with residual right sided hemiparesis, necrotizing pneumonia, COPD, HFrEF with ejection fraction of 40 to 45% (03/03/2022), presenting to the emergency room for unwitnessed fall. Patient was found to have a C7 transverse process fracture which was stable and patient was admitted for further work up of fall.   #Non-severe C. difficile colitis Patient was found to be C. difficile positive during hospitalization with copious months of diarrhea.  There was mild elevation in white count.  No fever or chills during hospitalization.  Patient was started on oral vancomycin during hospitalization.  Patient has received 2 days of antibiotics in the hospital, and will need another 8 days of outpatient oral vancomycin to complete a 10-day course.  Patient to be discharged on oral vancomycin for the next 8 days.  #Mechanical fall #Nondisplaced right transverse process fracture of C7 Patient had a fall transferring from bed to bedside commode. CT head negative for intracranial hemorrhage. CT cervical spine showing C7 right transverse process fracture.  Patient has not been requiring much pain medicine during hospitalization, and neurosurgery states that the fractures are stable, and needs no follow-up.  Patient c-collar was removed during hospitalization.   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  Given history, and more likely a mechanical fall, this less likely. EEG in hospital negative for seizures. Continue Plavix and Lipitor outpatient. Continue Keppra outpatient.     #HFrEF with ejection fraction of 40 to 45% (03/03/2022) #Chronic hypotension Initially there was concern about possible syncopal episode causing the fall. After history was given, this is less likely.  Patient was started on low-dose midodrine 2.5 mg 3 times daily, which helped bring blood pressures  up.   #COPD, no exacerbation No concern about this in the hospital. Patient continued on home Pulmicort, albuterol, and incruse Ellipta.   #SCC of the lung, stage IV -Patient received radiation therapy prior to admission on 08/26/2022, and had another radiation therapy during admission on 08/30/2022.   #Nicotine dependence -Nicotine patch provided during hospitalization

## 2022-08-27 NOTE — ED Triage Notes (Addendum)
Pt BIB [facility] for an unwitnessed fall at home with c/o R sided pain. Pt was found on ground (hardwood floors). Per EMS, no LOC and unknown if he hit head. No injury noted except for tenderness at R rib cage. No c-collar in place upon arrival. Pt on plavix. At baseline, pt has dysphagia and R sided weakness d/t hx of CVA. VSS

## 2022-08-27 NOTE — Progress Notes (Signed)
HD#0 Subjective:   Summary: This is a 61 year old male with a history of CVA with residual right sided hemiparesis, necrotizing pneumonia, COPD, HFrEF with ejection fraction of 40 to 45% (03/03/2022), presenting to the emergency room for unwitnessed fall. Patient was found to have a C7 transverse process fracture which was stable and patient was admitted for further work up of fall.  Overnight Events: No overnight events    Patient is resting with a C-collar in place upon my exam. History is very poor given the history of aphasia. Patient is not sure how he fell. Patient is not sure if he loss consciousness at all. He is unsure if her had hit his head or not. It is unsure if this was witnessed or not. Patient does live at home with his cousin and the cousins daughter who take care  of him at home. Will call [name] for more history about this fall.  Spoke with his cousin [name] on the phone who is his caretaker.  [name] states that the patient had a mechanical fall trying to transfer from his bed to the bedside commode.  Patient does not normally walk at home.  No loss of consciousness.  Unsure of head impact.  [name] his daughter was at home when this happened, and she heard a thud and went to go check on the patient.  Patient's daughter reports that the patient did not lose consciousness but was unsure about head impact.  There was no seizure-like activity.  There was no bleeding.  There was no bladder or bowel incontinence.   Objective:  Vital signs in last 24 hours: Vitals:   08/27/22 1230 08/27/22 1300 08/27/22 1317 08/27/22 1430  BP: 97/72 111/67  91/65  Pulse: 69 74  76  Resp: 15 17  (!) 22  Temp:   98.1 F (36.7 C)   TempSrc:   Oral   SpO2: 96% 98%  100%  Weight:      Height:       Supplemental O2: Room Air SpO2: 100 %   Physical Exam:  Constitutional: Patient is resting comfortable in bed in no acute distress Eyes: conjunctiva non-erythematous Neck:Unable to assess ROM given C  collar in place  Cardiovascular: regular rate and rhythm, no m/r/g Pulmonary/Chest: normal work of breathing on room air, lungs clear to auscultation bilaterally Abdominal: soft, non-tender, non-distended Neurological: Patient is aphasic at baseline. 2/5 strength to right upper and lower extremities noted. 5/5 strength noted to upper and lower left extremities    Filed Weights   08/27/22 0330  Weight: 74.8 kg     Intake/Output Summary (Last 24 hours) at 08/27/2022 1522 Last data filed at 08/27/2022 0852 Gross per 24 hour  Intake 100 ml  Output 100 ml  Net 0 ml   Net IO Since Admission: 0 mL [08/27/22 1522]  Pertinent Labs:    Latest Ref Rng & Units 08/27/2022    4:18 AM 08/27/2022    3:45 AM 08/16/2022    2:34 AM  CBC  WBC 4.0 - 10.5 K/uL  8.5  4.2   Hemoglobin 13.0 - 17.0 g/dL 10.5  10.2  9.5   Hematocrit 39.0 - 52.0 % 31.0  32.2  28.8   Platelets 150 - 400 K/uL  424  205        Latest Ref Rng & Units 08/27/2022    4:18 AM 08/16/2022    2:34 AM 08/14/2022    3:07 AM  CMP  Glucose 70 -  99 mg/dL 110  122  162   BUN 8 - 23 mg/dL 16  19  14    Creatinine 0.61 - 1.24 mg/dL 0.80  0.85  0.86   Sodium 135 - 145 mmol/L 135  139  141   Potassium 3.5 - 5.1 mmol/L 3.7  4.2  3.7   Chloride 98 - 111 mmol/L 98  104  103   CO2 22 - 32 mmol/L  26  27   Calcium 8.9 - 10.3 mg/dL  8.6  8.6   Total Protein 6.5 - 8.1 g/dL   5.5   Total Bilirubin 0.3 - 1.2 mg/dL   0.7   Alkaline Phos 38 - 126 U/L   63   AST 15 - 41 U/L   20   ALT 0 - 44 U/L   28     Imaging: ECHOCARDIOGRAM COMPLETE BUBBLE STUDY  Result Date: 08/27/2022    ECHOCARDIOGRAM REPORT   Patient Name:   Todd Estrada Date of Exam: 08/27/2022 Medical Rec #:  [id number]     Height:       72.0 in Accession #:    [id number]    Weight:       165.0 lb Date of Birth:  07/28/1961     BSA:          1.963 m Patient Age:    73 years      BP:           96/64 mmHg Patient Gender: M             HR:           72 bpm. Exam Location:  Inpatient Procedure:  2D Echo, Cardiac Doppler, Color Doppler and Saline Contrast Bubble            Study Indications:    Syncope 780.2 / R55  History:        Patient has prior history of Echocardiogram examinations, most                 recent 03/03/2022. CHF, CAD; COPD and Stroke. Unwitnessed fall.                 Cervical fracture. Necrotizing pneumonia. Lung squamous cell                 carcinoma stage IV.  Sonographer:    Darlina Sicilian RDCS Referring Phys: 98 EMILY B MULLEN  Sonographer Comments: Suboptimal subcostal window, no parasternal window, no apical window and Technically difficult study due to poor echo windows. Limited patient mobility due to cervical fracture. Patient scanned while supine. IMPRESSIONS  1. Nondiagnostic echo, severely limited visualization and poor echo windows. . Left ventricular ejection fraction, by estimation, is unable to assess%. The left ventricle has cannot assess function. Left ventricular endocardial border not optimally defined to evaluate regional wall motion. The left ventricular internal cavity size was Not well visualized. not well visualized left ventricular hypertrophy. Left ventricular diastolic parameters are indeterminate.  2. Right ventricular systolic function was not well visualized. The right ventricular size is not well visualized.  3. The mitral valve was not well visualized. not well visualized mitral valve regurgitation. not well visualized mitral stenosis.  4. Tricuspid valve regurgitation not well visualized. not well visualized tricuspid stenosis.  5. The aortic valve was not well visualized. Aortic valve regurgitation not well visualized. not well visualized.  6. Pulmonic valve regurgitation not well visualized.  7. The inferior vena cava is normal in size  with greater than 50% respiratory variability, suggesting right atrial pressure of 3 mmHg. FINDINGS  Left Ventricle: Nondiagnostic echo, severely limited visualization and poor echo windows. Left ventricular ejection  fraction, by estimation, is unable to assess%. The left ventricle has cannot assess function. Left ventricular endocardial border not optimally defined to evaluate regional wall motion. The left ventricular internal cavity size was Not well visualized. Not well visualized left ventricular hypertrophy. Left ventricular diastolic parameters are indeterminate. Right Ventricle: The right ventricular size is not well visualized. Right vetricular wall thickness was not well visualized. Right ventricular systolic function was not well visualized. Left Atrium: Left atrial size was not well visualized. Right Atrium: Right atrial size was not well visualized. Pericardium: The pericardium was not well visualized. Mitral Valve: The mitral valve was not well visualized. Not well visualized mitral valve regurgitation. Not well visualized mitral valve stenosis. Tricuspid Valve: The tricuspid valve is not well visualized. Tricuspid valve regurgitation not well visualized. not well visualized tricuspid stenosis. Aortic Valve: The aortic valve was not well visualized. Aortic valve regurgitation not well visualized. Not well visualized. Pulmonic Valve: The pulmonic valve was not well visualized. Pulmonic valve regurgitation not well visualized. Aorta: The aortic root is normal in size and structure. Venous: The inferior vena cava is normal in size with greater than 50% respiratory variability, suggesting right atrial pressure of 3 mmHg. IAS/Shunts: The interatrial septum was not well visualized. Agitated saline contrast was given intravenously to evaluate for intracardiac shunting.  LEFT VENTRICLE PLAX 2D LVOT diam:     2.40 cm LVOT Area:     4.52 cm   AORTA Ao Root diam: 3.50 cm  SHUNTS Systemic Diam: 2.40 cm Carlyle Dolly MD Electronically signed by Carlyle Dolly MD Signature Date/Time: 08/27/2022/11:51:43 AM    Final    EEG adult  Result Date: 08/27/2022 Lora Havens, MD     08/27/2022  8:19 AM Patient Name: LORY GALAN  MRN: [id number] Epilepsy Attending: Lora Havens Referring Physician/Provider: [name], MD Date: 08/27/2022 Duration: 22.21 mins Patient history: 61 y.o. 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 EEG to evaluate for seizure Level of alertness: Awake AEDs during EEG study: LEV Technical aspects: This EEG study was done with scalp electrodes positioned according to the 10-20 International system of electrode placement. Electrical activity was reviewed with band pass filter of 1-70Hz , sensitivity of 7 uV/mm, display speed of 49mm/sec with a 60Hz  notched filter applied as appropriate. EEG data were recorded continuously and digitally stored.  Video monitoring was available and reviewed as appropriate. Description: The posterior dominant rhythm consists of 8 Hz activity of moderate voltage (25-35 uV) seen predominantly in posterior head regions, symmetric and reactive to eye opening and eye closing. EEG showed continuous 3 to 6 Hz theta-delta slowing in left hemisphere. Hyperventilation and photic stimulation were not performed.   ABNORMALITY - Continuous slow, left hemisphere IMPRESSION: This study is suggestive of cortical dysfunction arising from left hemisphere likely secondary to underlying structural abnormality/stroke. No seizures or epileptiform discharges were seen throughout the recording. Lora Havens   DG Chest Portable 1 View  Result Date: 08/27/2022 CLINICAL DATA:  Unwitnessed fall at home EXAM: PORTABLE CHEST 1 VIEW COMPARISON:  08/12/2022 FINDINGS: Interstitial coarsening from emphysema. Left perihilar opacity which was airspace disease by CT. Vague nodular density in the right peripheral lung with adjacent fiducial marker. No hemothorax,  pneumothorax, or lung contusion. Normal heart size and mediastinal contours IMPRESSION: No evidence of chest trauma.  Stable compared to 08/12/2022 chest radiograph. Electronically Signed   By: Jorje Guild M.D.   On: 08/27/2022 04:58   DG Hips Bilat W or Wo Pelvis 3-4 Views  Result Date: 08/27/2022 CLINICAL DATA:  Unwitnessed fall at home with right-sided pain EXAM: DG HIP (WITH OR WITHOUT PELVIS) 4V BILAT COMPARISON:  None Available. FINDINGS: There is no evidence of hip fracture or dislocation. No hip joint narrowing or spurring. IMPRESSION: Negative. Electronically Signed   By: Jorje Guild M.D.   On: 08/27/2022 04:42   CT Head Wo Contrast  Result Date: 08/27/2022 CLINICAL DATA:  Unwitnessed fall at nursing home EXAM: CT HEAD WITHOUT CONTRAST CT CERVICAL SPINE WITHOUT CONTRAST TECHNIQUE: Multidetector CT imaging of the head and cervical spine was performed following the standard protocol without intravenous contrast. Multiplanar CT image reconstructions of the cervical spine were also generated. RADIATION DOSE REDUCTION: This exam was performed according to the departmental dose-optimization program which includes automated exposure control, adjustment of the mA and/or kV according to patient size and/or use of iterative reconstruction technique. COMPARISON:  08/07/2022 FINDINGS: CT HEAD FINDINGS Brain: Chronic infarcts in the left cerebellum and even more extensively in the left MCA territory with there is dystrophic mineralization at the parietal lobe. Stable chronic white matter disease. No acute infarct, hemorrhage, hydrocephalus, or collection. Vascular: No hyperdense vessel or unexpected calcification. Skull: Normal. Negative for fracture or focal lesion. Sinuses/Orbits: No acute finding. CT CERVICAL SPINE FINDINGS Alignment: Normal Skull base and vertebrae: Nondisplaced transverse process fracture of C7 on the right. Soft tissues and spinal canal: No prevertebral fluid or swelling. No visible canal hematoma. Disc levels: Generalized spondylosis. Posterior endplate ridging most notable at C6-7. Mild  ossification of the posterior longitudinal ligament. Upper chest: Biapical emphysema IMPRESSION: 1. Nondisplaced right transverse process fracture of C7. 2. No evidence of acute intracranial injury. 3. 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 Electronically Signed   By: Jorje Guild M.D.   On: 08/27/2022 04:14   CT Cervical Spine Wo Contrast  Result Date: 08/27/2022 CLINICAL DATA:  Unwitnessed fall at nursing home EXAM: CT HEAD WITHOUT CONTRAST CT CERVICAL SPINE WITHOUT CONTRAST TECHNIQUE: Multidetector CT imaging of the head and cervical spine was performed following the standard protocol without intravenous contrast. Multiplanar CT image reconstructions of the cervical spine were also generated. RADIATION DOSE REDUCTION: This exam was performed according to the departmental dose-optimization program which includes automated exposure control, adjustment of the mA and/or kV according to patient size and/or use of iterative reconstruction technique. COMPARISON:  08/07/2022 FINDINGS: CT HEAD FINDINGS Brain: Chronic infarcts in the left cerebellum and even more extensively in the left MCA territory with there is dystrophic mineralization at the parietal lobe. Stable chronic white matter disease. No acute infarct, hemorrhage, hydrocephalus, or collection. Vascular: No hyperdense vessel or unexpected calcification. Skull: Normal. Negative for fracture or focal lesion. Sinuses/Orbits: No acute finding. CT CERVICAL SPINE FINDINGS Alignment: Normal Skull base and vertebrae: Nondisplaced transverse process fracture of C7 on the right. Soft tissues and spinal canal: No prevertebral fluid or swelling. No visible canal hematoma. Disc levels: Generalized spondylosis. Posterior endplate ridging most notable at C6-7. Mild ossification of the posterior longitudinal ligament. Upper chest: Biapical emphysema IMPRESSION: 1. Nondisplaced right transverse process fracture of C7. 2. No evidence of acute intracranial injury.  3. 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 Electronically Signed   By: Jorje Guild  M.D.   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 male with a history of CVA with residual right sided hemiparesis, necrotizing pneumonia, COPD, HFrEF with ejection fraction of 40 to 45% (03/03/2022), presenting to the emergency room for unwitnessed fall. Patient was found to have a C7 transverse process fracture which was stable and patient was admitted for further work up of fall.  #Mechanical fall #Nondisplaced right transverse process fracture of C7 Given history, it is likely that the patient had a mechanical fall and unlikely a syncopal episode.  This is unlikely seizure.  Patient's cousin, [name], states that the patient had a mechanical fall after trying to transfer from the bed to the bedside commode.  Based off history, patient did not have syncopal event, or seizure-like activity.  This is likely more a mechanical fall, and work-up for syncopal episode or seizure-like activity cannot be discontinued.  -Neurosurgery consulted, who recommends c-collar, await further recs -Continue pain management with lidocaine patch as well as oxycodone 5 mg every 4 hours as needed -Continue to monitor for neurological symptoms  #CVA #History of seizure after stroke Initially there was concern about possible seizure-like activity that may have caused the fall.  Given history, this is unlikely.  Patient's medications are managed by his cousin, and patient is compliant with medication.  No concern for seizure-like activity at this time -EEG negative for any  seizure activity -Continue Keppra 500 mg twice daily -As head bleed has been ruled out, resume Plavix 75 mg daily -Continue home atorvastatin 40 mg daily  #HFrEF with ejection fraction of 40 to 45% (03/03/2022) Initially there was concern about possible syncopal episode causing fall.  Given history, this is unlikely -No concern about heart failure exacerbation during this admission -Echo was very poor, and nothing was visualized, no need to repeat given history -Continue to monitor respiratory status  #COPD, no exacerbation -Continue patient on home Pulmicort, albuterol, and Incruse Ellipta -Brovana nebulizer twice daily -Continue to monitor patient respiratory status  #SCC of the lung, stage IV -Patient received radiation therapy yesterday on 08/26/2022 -Continue to monitor for reactions  #Nicotine dependence -Nicotine patch provided  Diet: Pured diet IVF: N/A VTE: Enoxaparin Code: DNR Family Update: [name] updated on patient's status.  [name] gives history of mechanical fall.  [name] does not want patient to go to SNF, and rather have the patient at home with him stating that the patient does have 24/7 care at home.   Dispo: Anticipated discharge to Home in 1 days pending clinical improvement.   [facility] Resident PGY-1 [phone] Please contact the on call pager after 5 pm and on weekends at [phone].

## 2022-08-27 NOTE — Progress Notes (Signed)
EEG complete - results pending 

## 2022-08-27 NOTE — Progress Notes (Signed)
PT Cancellation Note  Patient Details Name: Todd Estrada MRN: [id number] DOB: [birth year]   Cancelled Treatment:    Reason Eval/Treat Not Completed: Patient declined, no reason specified. PT attempted to evaluate pt, pt responding in yes/no's with fair accuracy and declines mobility at this time despite PT encouragement. PT will follow up as time allows.   Zenaida Niece 08/27/2022, 4:24 PM

## 2022-08-28 DIAGNOSIS — S12601A Unspecified nondisplaced fracture of seventh cervical vertebra, initial encounter for closed fracture: Secondary | ICD-10-CM | POA: Diagnosis present

## 2022-08-28 DIAGNOSIS — Z800 Family history of malignant neoplasm of digestive organs: Secondary | ICD-10-CM | POA: Diagnosis not present

## 2022-08-28 DIAGNOSIS — I69351 Hemiplegia and hemiparesis following cerebral infarction affecting right dominant side: Secondary | ICD-10-CM | POA: Diagnosis not present

## 2022-08-28 DIAGNOSIS — I5042 Chronic combined systolic (congestive) and diastolic (congestive) heart failure: Secondary | ICD-10-CM | POA: Diagnosis present

## 2022-08-28 DIAGNOSIS — W19XXXA Unspecified fall, initial encounter: Secondary | ICD-10-CM | POA: Diagnosis not present

## 2022-08-28 DIAGNOSIS — Z7401 Bed confinement status: Secondary | ICD-10-CM | POA: Diagnosis not present

## 2022-08-28 DIAGNOSIS — R296 Repeated falls: Secondary | ICD-10-CM | POA: Diagnosis not present

## 2022-08-28 DIAGNOSIS — I69322 Dysarthria following cerebral infarction: Secondary | ICD-10-CM | POA: Diagnosis not present

## 2022-08-28 DIAGNOSIS — Y92013 Bedroom of single-family (private) house as the place of occurrence of the external cause: Secondary | ICD-10-CM | POA: Diagnosis not present

## 2022-08-28 DIAGNOSIS — A0472 Enterocolitis due to Clostridium difficile, not specified as recurrent: Secondary | ICD-10-CM | POA: Diagnosis present

## 2022-08-28 DIAGNOSIS — Z7951 Long term (current) use of inhaled steroids: Secondary | ICD-10-CM | POA: Diagnosis not present

## 2022-08-28 DIAGNOSIS — Z6820 Body mass index (BMI) 20.0-20.9, adult: Secondary | ICD-10-CM | POA: Diagnosis not present

## 2022-08-28 DIAGNOSIS — E440 Moderate protein-calorie malnutrition: Secondary | ICD-10-CM | POA: Diagnosis present

## 2022-08-28 DIAGNOSIS — Z66 Do not resuscitate: Secondary | ICD-10-CM | POA: Diagnosis present

## 2022-08-28 DIAGNOSIS — Z808 Family history of malignant neoplasm of other organs or systems: Secondary | ICD-10-CM | POA: Diagnosis not present

## 2022-08-28 DIAGNOSIS — J449 Chronic obstructive pulmonary disease, unspecified: Secondary | ICD-10-CM | POA: Diagnosis present

## 2022-08-28 DIAGNOSIS — I9589 Other hypotension: Secondary | ICD-10-CM | POA: Diagnosis not present

## 2022-08-28 DIAGNOSIS — C3412 Malignant neoplasm of upper lobe, left bronchus or lung: Secondary | ICD-10-CM | POA: Diagnosis present

## 2022-08-28 DIAGNOSIS — Z79899 Other long term (current) drug therapy: Secondary | ICD-10-CM | POA: Diagnosis not present

## 2022-08-28 DIAGNOSIS — Z7902 Long term (current) use of antithrombotics/antiplatelets: Secondary | ICD-10-CM | POA: Diagnosis not present

## 2022-08-28 DIAGNOSIS — I69320 Aphasia following cerebral infarction: Secondary | ICD-10-CM | POA: Diagnosis not present

## 2022-08-28 DIAGNOSIS — M549 Dorsalgia, unspecified: Secondary | ICD-10-CM | POA: Diagnosis present

## 2022-08-28 DIAGNOSIS — W1811XA Fall from or off toilet without subsequent striking against object, initial encounter: Secondary | ICD-10-CM | POA: Diagnosis present

## 2022-08-28 DIAGNOSIS — Z841 Family history of disorders of kidney and ureter: Secondary | ICD-10-CM | POA: Diagnosis not present

## 2022-08-28 DIAGNOSIS — R569 Unspecified convulsions: Secondary | ICD-10-CM | POA: Diagnosis present

## 2022-08-28 DIAGNOSIS — F17210 Nicotine dependence, cigarettes, uncomplicated: Secondary | ICD-10-CM | POA: Diagnosis present

## 2022-08-28 LAB — C DIFFICILE QUICK SCREEN W PCR REFLEX
C Diff antigen: POSITIVE — AB
C Diff interpretation: DETECTED
C Diff toxin: POSITIVE — AB

## 2022-08-28 LAB — CBC WITH DIFFERENTIAL/PLATELET
Abs Immature Granulocytes: 0.02 10*3/uL (ref 0.00–0.07)
Basophils Absolute: 0 10*3/uL (ref 0.0–0.1)
Basophils Relative: 0 %
Eosinophils Absolute: 0 10*3/uL (ref 0.0–0.5)
Eosinophils Relative: 0 %
HCT: 29.7 % — ABNORMAL LOW (ref 39.0–52.0)
Hemoglobin: 9.5 g/dL — ABNORMAL LOW (ref 13.0–17.0)
Immature Granulocytes: 0 %
Lymphocytes Relative: 4 %
Lymphs Abs: 0.4 10*3/uL — ABNORMAL LOW (ref 0.7–4.0)
MCH: 30.5 pg (ref 26.0–34.0)
MCHC: 32 g/dL (ref 30.0–36.0)
MCV: 95.5 fL (ref 80.0–100.0)
Monocytes Absolute: 0.6 10*3/uL (ref 0.1–1.0)
Monocytes Relative: 7 %
Neutro Abs: 8.7 10*3/uL — ABNORMAL HIGH (ref 1.7–7.7)
Neutrophils Relative %: 89 %
Platelets: 361 10*3/uL (ref 150–400)
RBC: 3.11 MIL/uL — ABNORMAL LOW (ref 4.22–5.81)
RDW: 16.5 % — ABNORMAL HIGH (ref 11.5–15.5)
WBC: 9.8 10*3/uL (ref 4.0–10.5)
nRBC: 0 % (ref 0.0–0.2)

## 2022-08-28 LAB — BASIC METABOLIC PANEL
Anion gap: 11 (ref 5–15)
BUN: 13 mg/dL (ref 8–23)
CO2: 24 mmol/L (ref 22–32)
Calcium: 8.6 mg/dL — ABNORMAL LOW (ref 8.9–10.3)
Chloride: 101 mmol/L (ref 98–111)
Creatinine, Ser: 0.83 mg/dL (ref 0.61–1.24)
GFR, Estimated: >60 mL/min (ref >60)
Glucose, Bld: 106 mg/dL — ABNORMAL HIGH (ref 70–99)
Potassium: 4.2 mmol/L (ref 3.5–5.1)
Sodium: 136 mmol/L (ref 135–145)

## 2022-08-28 MED ORDER — MIDODRINE HCL 5 MG PO TABS
2.5000 mg | ORAL_TABLET | Freq: Three times a day (TID) | ORAL | Status: DC
  Administered 2022-08-28 – 2022-08-30 (×8): 2.5 mg via ORAL
  Filled 2022-08-28 (×8): qty 1

## 2022-08-28 MED ORDER — VANCOMYCIN HCL 125 MG PO CAPS
125.0000 mg | ORAL_CAPSULE | Freq: Four times a day (QID) | ORAL | Status: DC
Start: 2022-08-29 — End: 2022-08-30
  Administered 2022-08-29 – 2022-08-30 (×7): 125 mg via ORAL
  Filled 2022-08-28 (×9): qty 1

## 2022-08-28 NOTE — Evaluation (Addendum)
Occupational Therapy Evaluation Patient Details Name: Todd Estrada MRN: [id number] DOB: 11/24/1961 Today's Date: 08/28/2022   History of Present Illness pt is a 61 y.o. male presents to [facility] hospital on 08/27/2022 after an unwitnessed fall at home, experiencing chest pain after fall. Pt recently hospitalized on 8/25 for sepsis 2/2 necrotizing PNA. CT head reveals nondisplaced R transverse process fx of C7. PMH includes PNA, COPD, CHF, CVA with aphasia and hemiplegia, lung SCC stage IV, seizures.   Clinical Impression   On arrival to room pt with large loose BM, with decreased awareness of need for keeping hands out and away from stool. Pt was requiring mod cues throughout for sequencing and at bed level was dep for full peri care/hygiene and LB dressing. Min A for UB dressing for replacement of gown. Following 1 step commands throughout session, but persistently trying to communicate with therapist despite baseline expressive aphasia and not providing any pointing or visual cues about attempt at communications. Pt with persistently soft BP at bed level of which RN made aware and was present for transitions during attempt at bed change with little improvement. Pt requiring min A for STS and lateral scooting/bed mobility, and grossly appears to be at or near functional baseline. He will benefit from continued acute OT to address strength, balance and overall safety and may be appropriate for post acute therapy depending on available of family to provide 24hr S/A. If unable, pt may be appropriate for SNF.     Unable to obtain full orthostatics- but RN in room to assist see below Bed level-90/55 EOB-74/46 EOB 2 minutes- 80/70 After lateral scooting and return to bed- 101/79   Recommendations for follow up therapy are one component of a multi-disciplinary discharge planning process, led by the attending physician.  Recommendations may be updated based on patient status, additional functional criteria and  insurance authorization.   Follow Up Recommendations  Skilled nursing-short term rehab (<3 hours/day) (would be able to transition to home with HHOT and strict 24hr S/A)    Assistance Recommended at Discharge Frequent or constant Supervision/Assistance  Patient can return home with the following A lot of help with walking and/or transfers;A lot of help with bathing/dressing/bathroom    Functional Status Assessment  Patient has had a recent decline in their functional status and demonstrates the ability to make significant improvements in function in a reasonable and predictable amount of time.  Equipment Recommendations  Tub/shower seat    Recommendations for Other Services       Precautions / Restrictions Precautions Precautions: Fall (aphaisa) Restrictions Weight Bearing Restrictions: No      Mobility Bed Mobility Overal bed mobility: Needs Assistance Bed Mobility: Supine to Sit, Sit to Supine     Supine to sit: Min assist Sit to supine: Min assist   General bed mobility comments: assist for LB more than UB back to bed.    Transfers Overall transfer level: Needs assistance Equipment used: 1 person hand held assist Transfers: Sit to/from Stand Sit to Stand: Min assist          Lateral/Scoot Transfers: Min assist General transfer comment: mild mpulsiveness needing near constant redirection.      Balance Overall balance assessment: Needs assistance Sitting-balance support: No upper extremity supported, Feet supported Sitting balance-Leahy Scale: Good  ADL either performed or assessed with clinical judgement   ADL Overall ADL's : Needs assistance/impaired Eating/Feeding: Set up   Grooming: Set up Grooming Details (indicate cue type and reason): bed level     Lower Body Bathing: Maximal assistance;Bed level Lower Body Bathing Details (indicate cue type and reason): decreased sequencing and awareness of  task Upper Body Dressing : Minimal assistance;Bed level Upper Body Dressing Details (indicate cue type and reason): doning gown Lower Body Dressing: Maximal assistance;Bed level Lower Body Dressing Details (indicate cue type and reason): socks/simulated standing clothing mangment with brief standing at EOB Toilet Transfer: Moderate assistance Toilet Transfer Details (indicate cue type and reason): attempted pivot transfer but deferred for safety d/t BP-           General ADL Comments: limited assessment, pt covered in significant amount of BM on arrival. noted to have hands in and around feces. needing redirection for hygiene and to keep hands off. RN aware. dep assist for hygiene. no other loose stool occurance while in room. but RN aware of color and consistency.     Vision         Perception     Praxis      Pertinent Vitals/Pain Pain Assessment Pain Assessment: Faces Faces Pain Scale: No hurt Breathing: normal Negative Vocalization: none Facial Expression: smiling or inexpressive Body Language: relaxed Consolability: no need to console PAINAD Score: 0     Hand Dominance Right   Extremity/Trunk Assessment Upper Extremity Assessment Upper Extremity Assessment: RUE deficits/detail RUE Deficits / Details: brace to R wrist, mild active elbow, but grossly decreased strength. weakness more distally. decreased attnetion to R UE.   Lower Extremity Assessment Lower Extremity Assessment: Defer to PT evaluation RLE Deficits / Details: AFO unavailable   Cervical / Trunk Assessment Cervical / Trunk Assessment: Normal   Communication Communication Communication: Expressive difficulties   Cognition Arousal/Alertness: Awake/alert Behavior During Therapy: WFL for tasks assessed/performed, Restless Overall Cognitive Status: Difficult to assess                                 General Comments: pt follow single step commands, note with decreased attention and mild  tendency for impulsive movements but otherwise, grossly appropriate. cognitive deficits are anticipated to be baseline     General Comments  mild skin discoloration to sacral region-meplex pad removed d/c BM. no open areas on skin. RN aware    Exercises     Shoulder Instructions      Home Living Family/patient expects to be discharged to:: Private residence Living Arrangements: Other relatives Available Help at Discharge: Family Type of Home: House Home Access: Stairs to enter Technical brewer of Steps: 2   [place]: One level     Bathroom Shower/Tub: Teacher, early years/pre: Standard Bathroom Accessibility: No   Home Equipment: Conservation officer, nature (2 wheels);Cane - single point;BSC/3in1;Wheelchair - manual   Additional Comments: all of PLOF and social recieved from pt's most recent OT note from admission from 8/25. pt was able to confirm use of w/c and A from ADLs from cousin. but limited otherwise      Prior Functioning/Environment Prior Level of Function : Needs assist  Cognitive Assist : Mobility (cognitive);ADLs (cognitive) Mobility (Cognitive): Intermittent cues ADLs (Cognitive): Set up cues Physical Assist : Mobility (physical);ADLs (physical) Mobility (physical): Transfers;Gait ADLs (physical): Bathing;Dressing;IADLs Mobility Comments: uses w/c, sometimes assist for pivot transfers from family ADLs Comments: indicated cousins  assist wtih dressing/bathing, but possibly not toileting? questionable report.        OT Problem List: Decreased strength;Decreased range of motion;Decreased activity tolerance;Impaired balance (sitting and/or standing);Decreased cognition;Impaired vision/perception;Decreased coordination;Impaired UE functional use      OT Treatment/Interventions: Self-care/ADL training;Therapeutic exercise;Energy conservation;DME and/or AE instruction;Therapeutic activities;Patient/family education;Balance training    OT Goals(Current  goals can be found in the care plan section) Acute Rehab OT Goals Patient Stated Goal: none stated OT Goal Formulation: Patient unable to participate in goal setting Time For Goal Achievement: 09/10/22 ADL Goals Pt Will Perform Grooming: with set-up;sitting Pt Will Perform Upper Body Bathing: with min assist;sitting Pt Will Transfer to Toilet: with min guard assist;bedside commode  OT Frequency: Min 2X/week    Co-evaluation              AM-PAC OT "6 Clicks" Daily Activity     Outcome Measure Help from another person eating meals?: A Little Help from another person taking care of personal grooming?: A Little Help from another person toileting, which includes using toliet, bedpan, or urinal?: A Little Help from another person bathing (including washing, rinsing, drying)?: A Lot Help from another person to put on and taking off regular upper body clothing?: A Little Help from another person to put on and taking off regular lower body clothing?: A Lot 6 Click Score: 16   End of Session Nurse Communication: Mobility status  Activity Tolerance: Patient tolerated treatment well Patient left: in bed;with call bell/phone within reach;with bed alarm set;with nursing/sitter in room  OT Visit Diagnosis: Unsteadiness on feet (R26.81);Other abnormalities of gait and mobility (R26.89);Muscle weakness (generalized) (M62.81);Cognitive communication deficit (R41.841);Low vision, both eyes (H54.2);Other symptoms and signs involving cognitive function;Hemiplegia and hemiparesis Hemiplegia - Right/Left: Right Hemiplegia - dominant/non-dominant: Dominant                Time: 1610-9604 OT Time Calculation (min): 34 min Charges:  OT General Charges $OT Visit: 1 Visit OT Evaluation $OT Eval Low Complexity: 1 Low OT Treatments $Self Care/Home Management : 8-22 mins  Katyra Tomassetti Cydni Reddoch MSOT, OTR/L acute rehab services Office: [phone]   Joya Gaskins 08/28/2022, 11:29 AM

## 2022-08-28 NOTE — Progress Notes (Signed)
SLP Cancellation Note  Patient Details Name: Todd Estrada MRN: [id number] DOB: 04-Feb-1961   Cancelled treatment:       Reason Eval/Treat Not Completed: Patient declined, no reason specified. Patient declined any PO's when SLP attempted to see him approximately 1130. Will attempt next date.   Sonia Baller, MA, CCC-SLP Speech Therapy

## 2022-08-28 NOTE — Progress Notes (Signed)
PT Cancellation Note  Patient Details Name: Todd Estrada MRN: [id number] DOB: [birth year]   Cancelled Treatment:    Reason Eval/Treat Not Completed: Medical issues which prohibited therapy. Pt has been incontinent of multiple large liquid stools. Will reattempt tomorrow if this improved.   Shary Decamp [address] [employer] 08/28/2022, 10:53 AM [place] Office [phone]

## 2022-08-28 NOTE — Progress Notes (Signed)
  Orthostatic Vitals   Lying 99/55  104 Sitting 79/47  108  Unable to complete standing because patient was dizzy.

## 2022-08-28 NOTE — Progress Notes (Signed)
Subjective:   Hospital day: 2  Overnight event: No acute events overnight.  Interim History: Patient evaluated at the bedside laying in bed in a large loose stools. RN made aware. Patient has no acute complaints.  Denies any abdominal pain.  Objective:  Vital signs in last 24 hours: Vitals:   08/28/22 0510 08/28/22 0825 08/28/22 0837 08/28/22 0903  BP: 96/61  91/66   Pulse: 85     Resp:   20   Temp: 98 F (36.7 C) 99.1 F (37.3 C) 99.1 F (37.3 C)   TempSrc: Oral Oral    SpO2: 95%   97%  Weight:      Height:        Filed Weights   08/27/22 1547 08/27/22 1605 08/28/22 0008  Weight: 71.4 kg 71.4 kg 71.5 kg     Intake/Output Summary (Last 24 hours) at 08/28/2022 1354 Last data filed at 08/28/2022 1326 Gross per 24 hour  Intake 1951 ml  Output 1851 ml  Net 100 ml   Net IO Since Admission: 100 mL [08/28/22 1354]  No results for input(s): "GLUCAP" in the last 72 hours.   Pertinent Labs:    Latest Ref Rng & Units 08/28/2022    5:52 AM 08/27/2022    4:18 AM 08/27/2022    3:45 AM  CBC  WBC 4.0 - 10.5 K/uL 9.8   8.5   Hemoglobin 13.0 - 17.0 g/dL 9.5  10.5  10.2   Hematocrit 39.0 - 52.0 % 29.7  31.0  32.2   Platelets 150 - 400 K/uL 361   424        Latest Ref Rng & Units 08/28/2022    5:52 AM 08/27/2022    4:18 AM 08/16/2022    2:34 AM  CMP  Glucose 70 - 99 mg/dL 106  110  122   BUN 8 - 23 mg/dL 13  16  19    Creatinine 0.61 - 1.24 mg/dL 0.83  0.80  0.85   Sodium 135 - 145 mmol/L 136  135  139   Potassium 3.5 - 5.1 mmol/L 4.2  3.7  4.2   Chloride 98 - 111 mmol/L 101  98  104   CO2 22 - 32 mmol/L 24   26   Calcium 8.9 - 10.3 mg/dL 8.6   8.6     Imaging: No results found.  Physical Exam  General: Pleasant,  laying in bed. No acute distress. Neck: Stable in c-collar. CV: RRR. No m/r/g. No LE edema Pulmonary: Lungs CTAB. Normal effort. No wheezing or rales. Abdominal: Soft, nontender, nondistended. Normal bowel sounds. Extremities: 2+ distal pulses. Normal  ROM. Skin: Warm and dry. No obvious rash or lesions. Neuro: Alert and interactive. Dysarthric speech.  Moves all extremities.  Normal sensation to gross touch. Psych: Normal mood and affect   Assessment/Plan: Todd Estrada is a 61 y.o. male with hx of CVA with residual right sided hemiparesis, necrotizing pneumonia, COPD, HFrEF with ejection fraction of 40 to 45% (03/03/2022), presenting to the emergency room for unwitnessed fall. Patient was found to have a C7 transverse process fracture which was stable and patient was admitted for further work up of fall.  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  Neck remained stable in c-collar.  Patient evaluated by PT/OT who recommended SNF however this recommendation has been discussed with his cousin who states that patient has 24-hour care and will prefer for him to come home. -NSG recommended c-collar, will discuss with them concerning length of time in c-collar tomorrow -Continue c-collar -Fall precaution -Continue pain management with lidocaine patch as well as oxycodone 5 mg every 4 hours as needed  -Pending home with PT/OT  #Chronic hypotension Patient has a history of soft BPs with SBP in the 90s to 100s. This is likely in the setting of his general deconditioning and bedbound status.  Due to his recent fall, orthostatic vitals were taken patient unable to stand up completely due to dizziness.  Results were positive from laying to sitting with SBP dropping from 99-75.  Will  initiate low-dose midodrine and monitor overnight. -Start midodrine 2.5 mg 3 times daily  #Diarrhea Patient was reported to have 3 watery stools earlier this morning described as mushy by RN.  No bloody stools.  Patient is at risk of C. difficile infection in the setting of his immunocompromise state (received radiation therapy 1 day before admission), had a 4-day hospitalization less than 2 weeks ago and received broad-spectrum antibiotics for necrotizing pneumonia. -Check GI panel -Check C. difficile screen -Monitor fluid status and replete as needed  #CVA #History of seizure after stroke Initially there was concern about possible seizure-like activity that may have caused the fall. EEG on admission was negative for epileptical discharges that was suggestive of cortical dysfunction from the left hemisphere likely due to his previous strokes. Patient has been adherent to his medications according to his cousin who helps with his med. -Continue Keppra 500 mg twice daily -Continue Plavix 75 mg daily -Continue home atorvastatin 40 mg daily   #HFrEF (EF 40-45% (03/03/2022) Stable and chronic. Last TTE in March 2023 showed EF 40-45%.  No signs of heart failure exacerbation on exam. Repeat TTE showed poor acoustic window and the results were nonconclusive. -Strict I&O's, daily weights   #COPD Stable and chronic. -Continue patient on home Pulmicort, albuterol, and Incruse Ellipta -Brovana nebulizer twice daily   #SCC of the lung, stage IV -Patient received radiation therapy on 08/26/2022 -CTM   #Nicotine dependence -Nicotine patch provided  Diet: HH IVF: None VTE: Lovenox CODE: DNR  Prior to Admission Living Arrangement: Home Anticipated Discharge Location: SNF versus home with PT/OT Barriers to Discharge: Medical stability Dispo: Anticipated discharge in approximately 1-2 day(s).   Signed: Lacinda Axon, MD 08/28/2022, 1:54 PM  Pager: [phone] Internal Medicine  Teaching Service After 5pm on weekdays and 1pm on weekends: On Call pager: [phone]

## 2022-08-29 ENCOUNTER — Ambulatory Visit: Payer: Medicaid Other | Admitting: Radiation Oncology

## 2022-08-29 DIAGNOSIS — S12601A Unspecified nondisplaced fracture of seventh cervical vertebra, initial encounter for closed fracture: Secondary | ICD-10-CM | POA: Diagnosis not present

## 2022-08-29 DIAGNOSIS — R296 Repeated falls: Secondary | ICD-10-CM

## 2022-08-29 LAB — GASTROINTESTINAL PANEL BY PCR, STOOL (REPLACES STOOL CULTURE)

## 2022-08-29 LAB — BASIC METABOLIC PANEL
Anion gap: 9 (ref 5–15)
BUN: 14 mg/dL (ref 8–23)
CO2: 24 mmol/L (ref 22–32)
Calcium: 8.5 mg/dL — ABNORMAL LOW (ref 8.9–10.3)
Chloride: 101 mmol/L (ref 98–111)
Creatinine, Ser: 0.72 mg/dL (ref 0.61–1.24)
GFR, Estimated: >60 mL/min (ref >60)
Glucose, Bld: 112 mg/dL — ABNORMAL HIGH (ref 70–99)
Potassium: 4 mmol/L (ref 3.5–5.1)
Sodium: 134 mmol/L — ABNORMAL LOW (ref 135–145)

## 2022-08-29 LAB — CBC
HCT: 28.1 % — ABNORMAL LOW (ref 39.0–52.0)
Hemoglobin: 9 g/dL — ABNORMAL LOW (ref 13.0–17.0)
MCH: 30.7 pg (ref 26.0–34.0)
MCHC: 32 g/dL (ref 30.0–36.0)
MCV: 95.9 fL (ref 80.0–100.0)
Platelets: 378 10*3/uL (ref 150–400)
RBC: 2.93 MIL/uL — ABNORMAL LOW (ref 4.22–5.81)
RDW: 16.8 % — ABNORMAL HIGH (ref 11.5–15.5)
WBC: 11.7 10*3/uL — ABNORMAL HIGH (ref 4.0–10.5)
nRBC: 0 % (ref 0.0–0.2)

## 2022-08-29 LAB — HIV ANTIBODY (ROUTINE TESTING W REFLEX): HIV Screen 4th Generation wRfx: NONREACTIVE

## 2022-08-29 MED ORDER — LEVETIRACETAM 500 MG PO TABS
500.0000 mg | ORAL_TABLET | Freq: Two times a day (BID) | ORAL | Status: DC
  Administered 2022-08-29 – 2022-08-30 (×3): 500 mg via ORAL
  Filled 2022-08-29 (×3): qty 1

## 2022-08-29 NOTE — Progress Notes (Signed)
Physical Therapy Evaluation Patient Details Name: Todd Estrada MRN: [id number] DOB: 1961-01-06 Today's Date: 08/29/2022  History of Present Illness  61 y.o. male presents to [facility] hospital on 08/27/2022 after an unwitnessed fall at home, experiencing chest pain after fall. Pt recently hospitalized on 8/25 for sepsis 2/2 necrotizing PNA. CT head reveals nondisplaced R transverse process fx of C7. PMH includes PNA, COPD, CHF, CVA with aphasia and hemiplegia, lung SCC stage IV, seizures.  Clinical Impression  Pt is demonstrating some very limited willingness to move, which PT was concerned was dizziness but pt indicates not.  He became very upset upon rolling to R side but did not observe abnormal eye movements at that time.  Pt immediately was fine upon returning to supine, and with communication issues could not tell PT what was going on with that.  May have to take our time with mobility, but per chart was at wheelchair level at home.  No family to confirm that but again per chart family is asking to take him home for pt comfort due to the disruption of rehab care.  Pt may actually be close to PLOF, and if so might need consideration for LT care in the future.  Recommending HHPT for now to get pt back to his transfers to chair for home management.  Follow POC as outlined below with goals for acute PT.     Recommendations for follow up therapy are one component of a multi-disciplinary discharge planning process, led by the attending physician.  Recommendations may be updated based on patient status, additional functional criteria and insurance authorization.  Follow Up Recommendations Home health PT      Assistance Recommended at Discharge Frequent or constant Supervision/Assistance  Patient can return home with the following  A lot of help with walking and/or transfers;A little help with bathing/dressing/bathroom;Assist for transportation;Help with stairs or ramp for entrance;Assistance with  cooking/housework    Equipment Recommendations None recommended by PT  Recommendations for Other Services       Functional Status Assessment Patient has had a recent decline in their functional status and demonstrates the ability to make significant improvements in function in a reasonable and predictable amount of time.     Precautions / Restrictions Precautions Precautions: Fall Precaution Comments: aphasia Required Braces or Orthoses: Other Brace;Cervical Brace Cervical Brace: Hard collar;At all times Other Brace: AFO RLE, brace RUE Restrictions Weight Bearing Restrictions: No      Mobility  Bed Mobility Overal bed mobility: Needs Assistance Bed Mobility: Rolling, Sidelying to Sit Rolling: Mod assist Sidelying to sit: Total assist       General bed mobility comments: pt would not demonstrate ability to assist to sit from sidelying but also would not allow PT to assist this attempt    Transfers                   General transfer comment: deferred due to pt not allowing PT to assist him    Ambulation/Gait               General Gait Details: has recently walked but would not allow PT to assist to stand and take steps, AFO not in room and pt has a dramatic foot drop on RLE  Stairs            Wheelchair Mobility    Modified Rankin (Stroke Patients Only) Modified Rankin (Stroke Patients Only) Pre-Morbid Rankin Score: Moderate disability Modified Rankin: Severe disability     Balance Overall  balance assessment: Needs assistance Sitting-balance support:  (declined PT to try)                                         Pertinent Vitals/Pain Pain Assessment Pain Assessment: Faces Faces Pain Scale: No hurt    Home Living Family/patient expects to be discharged to:: Private residence Living Arrangements: Other relatives Available Help at Discharge: Family Type of Home: House Home Access: Stairs to enter   [employer] of Steps: 2   Home Layout: One level Home Equipment: Conservation officer, nature (2 wheels);Cane - single point;BSC/3in1;Wheelchair - manual Additional Comments: pt is unable to give history and all obtained from chart    Prior Function Prior Level of Function : Patient poor historian/Family not available       Physical Assist : Mobility (physical) Mobility (physical): Bed mobility;Transfers   Mobility Comments: using WC for mobility per chart       Hand Dominance   Dominant Hand: Right    Extremity/Trunk Assessment   Upper Extremity Assessment Upper Extremity Assessment: Defer to OT evaluation    Lower Extremity Assessment Lower Extremity Assessment: Generalized weakness RLE Deficits / Details: pt has full foot drop on R ankle, no active movement detected    Cervical / Trunk Assessment Cervical / Trunk Assessment: Kyphotic (mild)  Communication   Communication: Expressive difficulties  Cognition Arousal/Alertness: Awake/alert Behavior During Therapy: Anxious, Impulsive Overall Cognitive Status: Difficult to assess                                 General Comments: pt is limiting movement to within bed, would not allow PT to progress to try to stand up or balance on side of bed        General Comments General comments (skin integrity, edema, etc.): Pt was able to help move but declines to let PT stand and walk him.  Has a great deal of impairment of communication and so is a challenge to reason and understand him    Exercises     Assessment/Plan    PT Assessment Patient needs continued PT services  PT Problem List Decreased strength;Decreased range of motion;Decreased activity tolerance;Decreased mobility;Decreased coordination       PT Treatment Interventions DME instruction;Gait training;Functional mobility training;Therapeutic activities;Therapeutic exercise;Balance training;Neuromuscular re-education;Patient/family education    PT Goals  (Current goals can be found in the Care Plan section)  Acute Rehab PT Goals Patient Stated Goal: none was stated PT Goal Formulation: Patient unable to participate in goal setting Time For Goal Achievement: 09/12/22 Potential to Achieve Goals: Fair    Frequency Min 3X/week     Co-evaluation               AM-PAC PT "6 Clicks" Mobility  Outcome Measure Help needed turning from your back to your side while in a flat bed without using bedrails?: A Little Help needed moving from lying on your back to sitting on the side of a flat bed without using bedrails?: A Lot Help needed moving to and from a bed to a chair (including a wheelchair)?: A Lot Help needed standing up from a chair using your arms (e.g., wheelchair or bedside chair)?: A Lot Help needed to walk in hospital room?: A Lot Help needed climbing 3-5 steps with a railing? : A Lot 6 Click Score: 13  End of Session   Activity Tolerance: Treatment limited secondary to medical complications (Comment) (unclear if pt was dizzy when trying to sit up on side of bed) Patient left: in bed;with call bell/phone within reach;with bed alarm set Nurse Communication: Mobility status PT Visit Diagnosis: Other abnormalities of gait and mobility (R26.89);Muscle weakness (generalized) (M62.81);Hemiplegia and hemiparesis Hemiplegia - Right/Left: Right Hemiplegia - dominant/non-dominant: Dominant Hemiplegia - caused by: Unspecified    Time: 7290-2111 PT Time Calculation (min) (ACUTE ONLY): 17 min   Charges:   PT Evaluation $PT Eval Moderate Complexity: 1 Mod         Ramond Dial 08/29/2022, 1:22 PM  Mee Hives, PT PhD Acute Rehab Dept. Number: [place] and [place]

## 2022-08-29 NOTE — Progress Notes (Signed)
   08/29/22 1451  Mobility  Activity Refused mobility   Pt refused mobility without reason. Will f/u as able.

## 2022-08-29 NOTE — Progress Notes (Signed)
Speech Language Pathology Treatment: Dysphagia  Patient Details Name: Todd Estrada MRN: [id number] DOB: [birth year] Today's Date: 08/29/2022 Time: 7517-0017 SLP Time Calculation (min) (ACUTE ONLY): 14 min  Assessment / Plan / Recommendation Clinical Impression  Todd Estrada was alert and interactive. His breakfast tray was at the bedside but minimally touched. He accepted the offer to help eat his sausage, but declined all else.  Assisted with self-feeding.  He demonstrated excellent toleration, no further oral spillage nor coughing with purees and water.  He was adamant about not wanting anything else from his tray.  His baseline aphasia makes communication difficult - however, his yes/no responses are clear and reliable re: wants/ needs. Recommend posing questions to him in a way that can be answered with a simple yes/no response, rather than asking open-ended questions.  SLP will follow.   HPI HPI: Patient is a 61 y.o. male with PMH: recent CVA with AIR stay in April at [facility] and residual dysphagia, aphasia and hemiplegia of right side, necrotizing PNA, COPD, CHF, lung cancer stage IV, seizures. He was admitted to [facility] ED on 08/27/22 following an unwitnessed fall at home. 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 Portable   CXR did not show any evidence of chest trauma; CT chest with contrast ordered but not completed yet.      SLP Plan  Continue with current plan of care      Recommendations for follow up therapy are one component of a multi-disciplinary discharge planning process, led by the attending physician.  Recommendations may be updated based on patient status, additional functional criteria and insurance authorization.    Recommendations  Diet recommendations: Dysphagia 1 (puree);Thin liquid Liquids provided via: Cup;Straw Medication Administration: Crushed with  puree Supervision: Patient able to self feed;Staff to assist with self feeding;Full supervision/cueing for compensatory strategies Compensations: Slow rate;Small sips/bites;Minimize environmental distractions Postural Changes and/or Swallow Maneuvers: Seated upright 90 degrees;Upright 30-60 min after meal                Oral Care Recommendations: Oral care BID Follow Up Recommendations: Follow physician's recommendations for discharge plan and follow up therapies Assistance recommended at discharge: Frequent or constant Supervision/Assistance SLP Visit Diagnosis: Dysphagia, unspecified (R13.10) Plan: Continue with current plan of care         Melton Walls L. Tivis Ringer, MA CCC/SLP Clinical Specialist - Acute Care SLP Acute Rehabilitation Services Office number [phone]   Juan Quam Laurice  08/29/2022, 9:26 AM

## 2022-08-29 NOTE — TOC Progression Note (Addendum)
Transition of Care [facility]) - Progression Note    Patient Details  Name: AB LEAMING MRN: [id number] Date of Birth: 1961-11-30  Transition of Care [facility]) CM/SW Contact  Zenon Mayo, RN Phone Number: 08/29/2022, 4:46 PM  Clinical Narrative:    NCM spoke with patient, offered choice, he is active with [facility] for [place], [place], and would like to continue with them.  NCM confirmed with Tommi Rumps with [facility] that he is active with them.  from home, s/p fall at home, positive for cdiff, has fracture to C7 , wears a c collar , has expressive aphasia.  Will need orders for HHPT, [place].  TOC following.        Expected Discharge Plan and Services                                                 Social Determinants of Health (SDOH) Interventions    Readmission Risk Interventions    08/16/2022    4:28 PM  Readmission Risk Prevention Plan  Transportation Screening Complete  Medication Review (RN Care Manager) Complete  HRI or [place] Complete  SW Recovery Care/Counseling Consult Complete  Palliative Care Screening Not [place] Not Applicable

## 2022-08-29 NOTE — Progress Notes (Addendum)
Subjective:   Hospital day: 3  Overnight event: C. difficile antigen and toxin positive overnight.  Patient was started on vancomycin overnight   Patient is resting in bed comfortably upon my exam.  Patient reports that he is still having diarrhea.  He denies any chest pain or shortness of breath.  He reports that he is eating well.  He denies any fever, chills, or rigors.  Patient wants me to inform his cousin of how he is doing.  He denies any pain.  Patient has no other concerns at this point.  Objective:  Vital signs in last 24 hours: Vitals:   08/29/22 0016 08/29/22 0520 08/29/22 0800 08/29/22 0825  BP:  96/67 105/77   Pulse:  84 88   Resp:  18 20   Temp:   98.8 F (37.1 C)   TempSrc:  Oral Oral   SpO2:  93% 95% 98%  Weight: 70.1 kg     Height:        Filed Weights   08/27/22 1605 08/28/22 0008 08/29/22 0016  Weight: 71.4 kg 71.5 kg 70.1 kg     Intake/Output Summary (Last 24 hours) at 08/29/2022 1236 Last data filed at 08/29/2022 0700 Gross per 24 hour  Intake 601.32 ml  Output 1325 ml  Net -723.68 ml   Net IO Since Admission: -235.68 mL [08/29/22 1236]  No results for input(s): "GLUCAP" in the last 72 hours.   Pertinent Labs:    Latest Ref Rng & Units 08/29/2022    4:01 AM 08/28/2022    5:52 AM 08/27/2022    4:18 AM  CBC  WBC 4.0 - 10.5 K/uL 11.7  9.8    Hemoglobin 13.0 - 17.0 g/dL 9.0  9.5  10.5   Hematocrit 39.0 - 52.0 % 28.1  29.7  31.0   Platelets 150 - 400 K/uL 378  361         Latest Ref Rng & Units 08/29/2022    4:01 AM 08/28/2022    5:52 AM 08/27/2022    4:18 AM  CMP  Glucose 70 - 99 mg/dL 112  106  110   BUN 8 - 23 mg/dL 14  13  16    Creatinine 0.61 - 1.24 mg/dL 0.72  0.83  0.80   Sodium 135 - 145 mmol/L 134  136  135   Potassium 3.5 - 5.1 mmol/L 4.0  4.2  3.7   Chloride 98 - 111 mmol/L 101  101  98   CO2 22 - 32 mmol/L 24  24    Calcium 8.9 - 10.3 mg/dL 8.5  8.6      Imaging: No results found.  Physical Exam  General: Pleasant,   laying in bed. No acute distress. Neck: Stable in c-collar. CV: RRR. No m/r/g. No LE edema. Abdominal: Soft, nontender, nondistended. Normal bowel sounds. Extremities: 2+ distal pulses. Normal ROM. Skin: Warm and dry. No obvious rash or lesions. Neuro: Alert and interactive. Dysarthric speech.  Moves all extremities.  Normal sensation to gross touch. Psych: Normal mood and affect   Assessment/Plan: Todd Estrada is a 61 y.o. male with hx of CVA with residual right sided hemiparesis, necrotizing pneumonia, COPD, HFrEF with ejection fraction of 40 to 45% (03/03/2022), presenting to the emergency room for unwitnessed fall. Patient was found to have a C7 transverse process fracture which was stable and patient was admitted for further work up of fall.  During hospitalization, patient was reporting diarrhea, and C. difficile colitis was diagnosed.  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 difficile colitis Patient was concerned about diarrhea yesterday.  Patient had 3 large liquid bowel movements.  Patient was recently treated with antibiotics 2 to 3 weeks ago for necrotizing pneumonia.  C. difficile antigen and toxin positive.  Patient was initiated on oral vancomycin. -Continue enteric precautions -This is day 1/10 of vancomycin 125 mg every 4 hours. -Continue to monitor vital signs as well as volume status  #Mechanical fall #Nondisplaced right transverse process fracture of C7 Initial concern for possible syncope episode however this has been clarified with his family and thought to be due to mechanical fall.  Patient to be discharged without c-collar.  Patient evaluated by PT/OT who recommended SNF however  this recommendation has been discussed with his cousin who states that patient has 24-hour care and will prefer for him to come home. -Neurosurgery is recommending c-collar, but with phone consultation today with Dr. Kathyrn Sheriff, they feel that this is a stable fracture, and the recommend no outpatient follow-up and no c-collar at this point.  Plan will be to discharge patient without c-collar. -Fall precaution -Continue pain management with lidocaine patch as well as oxycodone 5 mg every 4 hours as needed  -Pending home with PT/OT  #Chronic hypotension Patient has a history of soft BPs with SBP in the 90s to 100s. This is likely in the setting of his general deconditioning and bedbound status.  Due to his recent fall, orthostatic vitals were taken patient unable to stand up completely due to dizziness.  Results were positive from laying to sitting with SBP dropping from 99-75.  Pressures remain in the upper 90s -Pressure still ranging in the upper 90s to low 100s. -This could be due to his chronic hypotension as well as added fluid deficit given new onset C. difficile colitis -Continue midodrine 2.5 mg 3 times daily -Continue to monitor blood pressures  #CVA #History of seizure after stroke Initially there was concern about possible seizure-like activity that may have caused the fall. EEG on admission was negative for epileptical discharges that was suggestive of cortical dysfunction from the left hemisphere likely due to his previous strokes. Patient has been adherent to his medications according to his cousin who helps with his med. -Continue Keppra 500 mg twice daily -Continue Plavix 75 mg daily -Continue home atorvastatin 40 mg daily   #HFrEF (EF 40-45% (03/03/2022) Stable and chronic. Last TTE in March 2023 showed EF 40-45%.  No signs of heart failure exacerbation on exam. Repeat TTE showed poor acoustic window and the results were nonconclusive. -Strict I&O's, daily weights    #COPD Stable and chronic. -Continue patient on home Pulmicort, albuterol, and Incruse Ellipta -Brovana nebulizer twice daily   #SCC of the lung, stage IV -Patient received radiation therapy on 08/26/2022 -CTM   #Nicotine dependence -Nicotine patch provided  Diet: HH IVF: None VTE: Lovenox CODE: DNR  Prior to Admission Living Arrangement: Home Anticipated Discharge Location: SNF versus home with PT/OT Barriers to Discharge: Medical stability Dispo: Anticipated discharge in approximately 1-2 day(s).   SignedLeigh Aurora, DO 08/29/2022, 12:36 PM  Pager: [phone] Internal Medicine Teaching Service After 5pm on weekdays and 1pm on weekends: On Call pager: [phone]

## 2022-08-30 ENCOUNTER — Other Ambulatory Visit: Payer: Self-pay

## 2022-08-30 ENCOUNTER — Ambulatory Visit
Admission: RE | Admit: 2022-08-30 | Discharge: 2022-08-30 | Disposition: A | Discharge status: Home / Self Care | Payer: Medicaid Other | Source: Ambulatory Visit | Attending: Radiation Oncology | Admitting: Radiation Oncology

## 2022-08-30 ENCOUNTER — Other Ambulatory Visit (HOSPITAL_COMMUNITY): Payer: Self-pay

## 2022-08-30 ENCOUNTER — Telehealth (HOSPITAL_COMMUNITY): Payer: Self-pay | Admitting: Pharmacy Technician

## 2022-08-30 DIAGNOSIS — R296 Repeated falls: Secondary | ICD-10-CM | POA: Diagnosis not present

## 2022-08-30 DIAGNOSIS — S12601A Unspecified nondisplaced fracture of seventh cervical vertebra, initial encounter for closed fracture: Secondary | ICD-10-CM | POA: Diagnosis not present

## 2022-08-30 LAB — CBC WITH DIFFERENTIAL/PLATELET
Abs Immature Granulocytes: 0.04 10*3/uL (ref 0.00–0.07)
Basophils Absolute: 0 10*3/uL (ref 0.0–0.1)
Basophils Relative: 0 %
Eosinophils Absolute: 0 10*3/uL (ref 0.0–0.5)
Eosinophils Relative: 0 %
HCT: 28.4 % — ABNORMAL LOW (ref 39.0–52.0)
Hemoglobin: 9.5 g/dL — ABNORMAL LOW (ref 13.0–17.0)
Immature Granulocytes: 0 %
Lymphocytes Relative: 5 %
Lymphs Abs: 0.5 10*3/uL — ABNORMAL LOW (ref 0.7–4.0)
MCH: 31.1 pg (ref 26.0–34.0)
MCHC: 33.5 g/dL (ref 30.0–36.0)
MCV: 93.1 fL (ref 80.0–100.0)
Monocytes Absolute: 0.7 10*3/uL (ref 0.1–1.0)
Monocytes Relative: 7 %
Neutro Abs: 9.3 10*3/uL — ABNORMAL HIGH (ref 1.7–7.7)
Neutrophils Relative %: 88 %
Platelets: 358 10*3/uL (ref 150–400)
RBC: 3.05 MIL/uL — ABNORMAL LOW (ref 4.22–5.81)
RDW: 16.6 % — ABNORMAL HIGH (ref 11.5–15.5)
WBC: 10.6 10*3/uL — ABNORMAL HIGH (ref 4.0–10.5)
nRBC: 0 % (ref 0.0–0.2)

## 2022-08-30 LAB — RAD ONC ARIA SESSION SUMMARY
Course Elapsed Days: 57
Plan Fractions Treated to Date: 2
Plan Prescribed Dose Per Fraction: 18 Gy
Plan Total Fractions Prescribed: 3
Plan Total Prescribed Dose: 54 Gy
Reference Point Dosage Given to Date: 36 Gy
Reference Point Session Dosage Given: 18 Gy
Session Number: 35

## 2022-08-30 LAB — BASIC METABOLIC PANEL
Anion gap: 10 (ref 5–15)
BUN: 10 mg/dL (ref 8–23)
CO2: 27 mmol/L (ref 22–32)
Calcium: 8.8 mg/dL — ABNORMAL LOW (ref 8.9–10.3)
Chloride: 99 mmol/L (ref 98–111)
Creatinine, Ser: 0.73 mg/dL (ref 0.61–1.24)
GFR, Estimated: >60 mL/min (ref >60)
Glucose, Bld: 117 mg/dL — ABNORMAL HIGH (ref 70–99)
Potassium: 4.1 mmol/L (ref 3.5–5.1)
Sodium: 136 mmol/L (ref 135–145)

## 2022-08-30 MED ORDER — KETOTIFEN FUMARATE 0.025 % OP SOLN
1.0000 [drp] | Freq: Two times a day (BID) | OPHTHALMIC | 0 refills | Status: DC
  Filled 2022-08-30: qty 5, 50d supply, fill #0

## 2022-08-30 MED ORDER — DICLOFENAC SODIUM 1 % EX GEL
2.0000 g | Freq: Four times a day (QID) | CUTANEOUS | 1 refills | Status: DC
  Filled 2022-08-30: qty 100, 6d supply, fill #0

## 2022-08-30 MED ORDER — MIDODRINE HCL 2.5 MG PO TABS
2.5000 mg | ORAL_TABLET | Freq: Three times a day (TID) | ORAL | 5 refills | Status: DC
  Filled 2022-08-30: qty 90, 30d supply, fill #0

## 2022-08-30 MED ORDER — VANCOMYCIN HCL 125 MG PO CAPS
125.0000 mg | ORAL_CAPSULE | Freq: Four times a day (QID) | ORAL | 0 refills | Status: DC
  Filled 2022-08-30: qty 34, 9d supply, fill #0

## 2022-08-30 MED ORDER — LIDOCAINE 5 % EX PTCH
1.0000 | MEDICATED_PATCH | CUTANEOUS | 0 refills | Status: DC
Start: 2022-08-31 — End: 2022-10-14
  Filled 2022-08-30: qty 30, 30d supply, fill #0

## 2022-08-30 MED ORDER — SODIUM CHLORIDE 0.9 % IV BOLUS
1000.0000 mL | Freq: Once | INTRAVENOUS | Status: AC
Start: 2022-08-30 — End: 2022-08-30
  Administered 2022-08-30: 1000 mL via INTRAVENOUS

## 2022-08-30 NOTE — Progress Notes (Signed)
Physical Therapy Treatment Patient Details Name: Todd Estrada MRN: [id number] DOB: [birth year] Today's Date: 08/30/2022   History of Present Illness 61 y.o. male presents to [facility] hospital on 08/27/2022 after an unwitnessed fall at home, experiencing chest pain after fall. Pt recently hospitalized on 8/25 for sepsis 2/2 necrotizing PNA. CT head reveals nondisplaced R transverse process fx of C7. PMH includes PNA, COPD, CHF, CVA with aphasia and hemiplegia, lung SCC stage IV, seizures.    PT Comments    Pt received in bed, had watery diarrhea, cleaned up before getting up to side of bed. With much encouragement pt came to sitting EOB with min A to RLE. Pt stood with min A and transferred to recliner on L side. Pt could not maintain standing for more than 5 secs. Once positioned in chair pt did not want to stay there. Transferred back to bed with min A. Pt denied dizziness during mobility. HR in 120's, SPO2 90's on RA. PT will continue to follow.    Recommendations for follow up therapy are one component of a multi-disciplinary discharge planning process, led by the attending physician.  Recommendations may be updated based on patient status, additional functional criteria and insurance authorization.  Follow Up Recommendations  Home health PT     Assistance Recommended at Discharge Frequent or constant Supervision/Assistance  Patient can return home with the following A lot of help with walking and/or transfers;A little help with bathing/dressing/bathroom;Assist for transportation;Help with stairs or ramp for entrance;Assistance with cooking/housework   Equipment Recommendations  None recommended by PT    Recommendations for Other Services       Precautions / Restrictions Precautions Precautions: Fall Precaution Comments: aphasia Required Braces or Orthoses: Other Brace;Cervical Brace Cervical Brace: Hard collar;At all times Other Brace: AFO RLE, brace RUE Restrictions Weight Bearing  Restrictions: No     Mobility  Bed Mobility Overal bed mobility: Needs Assistance Bed Mobility: Rolling, Sit to Supine, Supine to Sit Rolling: Mod assist   Supine to sit: Min assist Sit to supine: Min assist   General bed mobility comments: pt uses LUE and momentum to come to EOB, min A to RLE. Pt able to return to supine with min A to RLE\    Transfers Overall transfer level: Needs assistance Equipment used: 1 person hand held assist Transfers: Sit to/from Stand Sit to Stand: Min assist Stand pivot transfers: Min assist         General transfer comment: pt agreeable to transferring to L only. Needed min A to steady as he stood. Could not maintain standing for >5 secs, pulled self into chair with LUE. Pt encouraged to stay up in chair for awhile but he deferred. Min A to return to bed.    Ambulation/Gait                   Stairs             Wheelchair Mobility    Modified Rankin (Stroke Patients Only) Modified Rankin (Stroke Patients Only) Pre-Morbid Rankin Score: Moderate disability Modified Rankin: Severe disability     Balance Overall balance assessment: Needs assistance Sitting-balance support: Single extremity supported, Feet supported Sitting balance-Leahy Scale: Good     Standing balance support: During functional activity, Single extremity supported Standing balance-Leahy Scale: Poor Standing balance comment: needs min A for safety  Cognition Arousal/Alertness: Awake/alert Behavior During Therapy: WFL for tasks assessed/performed Overall Cognitive Status: Difficult to assess                                 General Comments: pt follows most commands        Exercises      General Comments General comments (skin integrity, edema, etc.): HR into mid 120's with transfers, pt denied dizziness. SPO2 in 90's on RA. Pt with wet stool in bed upon presentation. Cleaned up before sitting up  to EOB.      Pertinent Vitals/Pain Pain Assessment Pain Assessment: Faces Faces Pain Scale: No hurt Breathing: normal Negative Vocalization: none Facial Expression: smiling or inexpressive Body Language: relaxed Consolability: no need to console PAINAD Score: 0    Home Living                          Prior Function            PT Goals (current goals can now be found in the care plan section) Acute Rehab PT Goals Patient Stated Goal: more covers because cold PT Goal Formulation: Patient unable to participate in goal setting Time For Goal Achievement: 09/12/22 Potential to Achieve Goals: Fair Progress towards PT goals: Progressing toward goals    Frequency    Min 3X/week      PT Plan Current plan remains appropriate    Co-evaluation              AM-PAC PT "6 Clicks" Mobility   Outcome Measure  Help needed turning from your back to your side while in a flat bed without using bedrails?: A Little Help needed moving from lying on your back to sitting on the side of a flat bed without using bedrails?: A Lot Help needed moving to and from a bed to a chair (including a wheelchair)?: A Lot Help needed standing up from a chair using your arms (e.g., wheelchair or bedside chair)?: A Lot Help needed to walk in hospital room?: Total Help needed climbing 3-5 steps with a railing? : Total 6 Click Score: 11    End of Session Equipment Utilized During Treatment: Gait belt Activity Tolerance: Patient tolerated treatment well Patient left: in bed;with call bell/phone within reach;with bed alarm set Nurse Communication: Mobility status PT Visit Diagnosis: Other abnormalities of gait and mobility (R26.89);Muscle weakness (generalized) (M62.81);Hemiplegia and hemiparesis Hemiplegia - Right/Left: Right Hemiplegia - dominant/non-dominant: Dominant Hemiplegia - caused by: Unspecified     Time: 1950-9326 PT Time Calculation (min) (ACUTE ONLY): 24 min  Charges:   $Therapeutic Activity: 23-37 mins                     Leighton Roach, PT  Acute Rehab Services Secure chat preferred Office [place] 08/30/2022, 11:33 AM

## 2022-08-30 NOTE — Progress Notes (Signed)
Occupational Therapy Treatment Patient Details Name: Todd Estrada MRN: [id number] DOB: November 30, 1961 Today's Date: 08/30/2022   History of present illness 61 y.o. male presents to [facility] hospital on 08/27/2022 after an unwitnessed fall at home, experiencing chest pain after fall. Pt recently hospitalized on 8/25 for sepsis 2/2 necrotizing PNA. CT head reveals nondisplaced R transverse process fx of C7. PMH includes PNA, COPD, CHF, CVA with aphasia and hemiplegia, lung SCC stage IV, seizures.   OT comments     Recommendations for follow up therapy are one component of a multi-disciplinary discharge planning process, led by the attending physician.  Recommendations may be updated based on patient status, additional functional criteria and insurance authorization.    Follow Up Recommendations  Skilled nursing-short term rehab (<3 hours/day) (would be able to transition to home with HHOT and strict 24hr S/A)    Assistance Recommended at Discharge Frequent or constant Supervision/Assistance  Patient can return home with the following  A lot of help with walking and/or transfers;A lot of help with bathing/dressing/bathroom   Equipment Recommendations  Tub/shower seat    Recommendations for Other Services      Precautions / Restrictions Precautions Precautions: Fall Precaution Comments: aphasia Required Braces or Orthoses: Other Brace;Cervical Brace Cervical Brace: Hard collar;At all times Other Brace: AFO RLE, brace RUE       Mobility Bed Mobility    Transfers  Pt declined OOB transfers as well as sitting up at EOB   Balance  Continue to assess in functional context   ADL either performed or assessed with clinical judgement   ADL Overall ADL's : Needs assistance/impaired Eating/Feeding: Set up;Bed level   Grooming: Set up;Wash/dry face;Wash/dry hands;Oral care;Bed level Grooming Details (indicate cue type and reason): bed level, increased time for tasks and verbal encouragement to  participate Upper Body Bathing: Minimal assistance;Bed level   Toilet Transfer:  (Pt declined toilet and chair transfer as he had recently gotten to chair and back with PT (per PT report).)   General ADL Comments: Pt participated in OT ADL retraining session with focus on grooming/UB bathing. Pt declined toilet transfer, sitting up in chair and sitting up at EOB this date. He requires increased time for tasks and verbal encouragement to participate.    Extremity/Trunk Assessment Upper Extremity Assessment RUE Deficits / Details: brace to R wrist, mild active elbow, but grossly decreased strength. weakness more distally. decreased attention to R UE. Uses L UE for function/ADL's RUE Coordination: decreased fine motor;decreased gross motor (No active movement detected)  Hemiplegia Right       Cognition Arousal/Alertness: Awake/alert Behavior During Therapy: WFL for tasks assessed/performed Overall Cognitive Status: Within Functional Limits for tasks assessed                General Comments Pt declined transfer to EOB, sitting up at EOB or to 3:1/chair despite moderate verbal encouragement    Pertinent Vitals/ Pain       Pain Assessment Pain Assessment: Faces Faces Pain Scale: No hurt  Home Living  Refer to initial eval for PLOF and home living situation      Prior Functioning/Environment   Please refer to initial OT Eval for prior level of function prior to this admission   Frequency  Min 2X/week        Progress Toward Goals  OT Goals(current goals can now be found in the care plan section)  Progress towards OT goals: Progressing toward goals  Acute Rehab OT Goals Patient Stated Goal: None stated OT  Goal Formulation: Patient unable to participate in goal setting Time For Goal Achievement: 09/10/22 Potential to Achieve Goals: Good  Plan Discharge plan remains appropriate       AM-PAC OT "6 Clicks" Daily Activity     Outcome Measure   Help from another person  eating meals?: A Little   Help from another person toileting, which includes using toliet, bedpan, or urinal?: A Little Help from another person bathing (including washing, rinsing, drying)?: A Lot Help from another person to put on and taking off regular upper body clothing?: A Little Help from another person to put on and taking off regular lower body clothing?: A Lot 6 Click Score: 13    End of Session    OT Visit Diagnosis: Unsteadiness on feet (R26.81);Other abnormalities of gait and mobility (R26.89);Muscle weakness (generalized) (M62.81);Cognitive communication deficit (R41.841);Low vision, both eyes (H54.2);Other symptoms and signs involving cognitive function;Hemiplegia and hemiparesis Symptoms and signs involving cognitive functions:  (Prior CVA) Hemiplegia - Right/Left: Right Hemiplegia - dominant/non-dominant: Dominant Hemiplegia - caused by:  (Prior CVA)   Activity Tolerance Patient tolerated treatment well   Patient Left in bed;with call bell/phone within reach;with nursing/sitter in room   Nurse Communication Mobility status        Time: 1017-5102 OT Time Calculation (min): 16 min  Charges: OT General Charges $OT Visit: 1 Visit OT Treatments $Self Care/Home Management : 8-22 mins   Josephine Igo Dixon, OT 08/30/2022, 11:03 AM

## 2022-08-30 NOTE — Telephone Encounter (Signed)
Patient Advocate Encounter   Received notification that prior authorization for Lidocaine 5% patches is required.   PA submitted on 08/30/2022 [place] [id number] Status is pending       Lyndel Safe, [place] Patient Advocate Specialist [place] Patient Advocate Team Direct Number: [phone]  Fax: [phone]

## 2022-08-30 NOTE — Discharge Summary (Addendum)
Name: Todd Estrada MRN: [id number] DOB: March 16, 1961 61 y.o. PCP: Todd Axe, MD  Date of Admission: 08/27/2022  3:19 AM Date of Discharge: 08/30/22 Attending Physician: Dr.  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   atorvastatin 40 MG tablet Commonly known as: Lipitor Take 1 tablet (40 mg total) by mouth daily.   budesonide 0.5 MG/2ML nebulizer solution Commonly known as: PULMICORT USE 2 ML(0.5 MG) VIA NEBULIZER TWICE DAILY What changed: See the new instructions.   clopidogrel 75 MG tablet Commonly known as: PLAVIX Take 1 tablet (75 mg total) by mouth daily. Okay to restart this medication on 05/31/2022 What changed: additional instructions   diclofenac Sodium 1 % Gel Commonly known as: Voltaren Apply 2 g topically 4 (four) times daily.   folic acid 1 MG tablet Commonly known as: FOLVITE Take 1 tablet (1 mg total) by mouth daily.   ketotifen 0.025 % ophthalmic solution Commonly known as: Allergy Eye Drops Place 1 drop into the right eye 2 (two) times daily.   levETIRAcetam 500 MG tablet Commonly known as: Keppra Take 1 tablet (500 mg total) by mouth 2 (two) times daily.   lidocaine 5 % Commonly known as: LIDODERM Place 1 patch onto the skin daily. Remove & Discard patch within 12 hours or as directed by MD Start taking on: August 31, 2022    midodrine 2.5 MG tablet Commonly known as: PROAMATINE Take 1 tablet (2.5 mg total) by mouth 3 (three) times daily with meals.   multivitamin with minerals Tabs tablet Take 1 tablet by mouth daily.   nicotine 14 mg/24hr patch Commonly known as: NICODERM CQ - dosed in mg/24 hours Place 1 patch (14 mg total) onto the skin daily.   ondansetron 4 MG tablet Commonly known as: ZOFRAN Take 4 mg by mouth every 8 (eight) hours as needed for nausea or vomiting.   Stiolto Respimat 2.5-2.5 MCG/ACT Aers Generic drug: Tiotropium Bromide-Olodaterol INHALE 2 PUFFS INTO THE LUNGS DAILY   vancomycin 125 MG capsule Commonly known as: VANCOCIN Take 1 capsule (125 mg total) by mouth 4 (four) times daily.        Disposition and follow-up:   Todd Estrada was discharged from [facility] in Stable condition.  At the hospital follow up visit please address:  1.  Follow-up:  a) Non-severe C. difficile colitis: During hospitalization, patient was noted to have diarrhea.  Patient had positive C. difficile antigen and toxin.  Patient was started on oral vancomycin.  Patient to be discharged on oral vancomycin.  Continue to monitor for downtrend of white count and monitor renal status.  Ensure continued improvement of diarrhea.  b) Nondisplaced C7 Right Transverse Process Fracture: Patient was treated with pain regimen in the hospital.  He did not require much pain medication during hospitalization.  Neurosurgery does not require follow-up  and are not concerned about neurological issues.  Continue to monitor for neurological issues.  c) Chronic hypotension: During hospitalization, patient was having hypotension with systolic blood pressure into the low 90s.  Patient was started on low-dose of midodrine of 2.5 mg 3 times daily.  Patient to be discharged on midodrine 2.5 mg 3 times daily.  Continue to monitor blood pressure outpatient.  2.  Labs / imaging needed at time of follow-up: CBC  and BMP  3.  Pending labs/ test needing follow-up: N/A  4.  Medication Changes  Abx : Patient is to be discharged on vancomycin 125 mg every 6 hours for the next 8 days.  Follow-up Appointments:  Follow-up Information     [facility] Follow up.   Specialty: [place] Why: Agency will call you to set up apt times Contact information: [place] [address] [phone]         Todd Galas, MD. Go on 09/09/2022.   Why: Please go to your appointment on 09/09/2022 at 9:45 AM for hospital follow-up. Contact information: [place] [address] [place] by problem list: Todd Estrada is a 61 y.o. male with a history of CVA with residual right sided hemiparesis, necrotizing pneumonia, COPD, HFrEF with ejection fraction of 40 to 45% (03/03/2022), presenting to the emergency room for unwitnessed fall. Patient was found to have a C7 transverse process fracture which was stable and patient was admitted for further work up of fall.   #Non-severe C. difficile colitis Patient was found to be C. difficile positive during hospitalization with multiple episodes of diarrhea.  There was mild elevation in white count.  No fever or chills during hospitalization.  Patient was started on oral vancomycin during hospitalization.  Bowel movements significantly improved following antibiotic initiation. Patient has received 2 days of antibiotics in the hospital, and will need another 8 days of outpatient oral vancomycin to complete a 10-day course.  Patient to be discharged on oral vancomycin for the next 8 days.  #Mechanical fall #Nondisplaced right transverse process fracture of C7 Patient had a fall transferring from bed to bedside commode. CT head negative for intracranial hemorrhage. CT cervical spine showing C7 right transverse process fracture.  Patient has not been requiring much pain  medicine during hospitalization, and neurosurgery states that the fractures are stable, and needs no follow-up or C-collar.  Patient c-collar was removed during hospitalization.   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  Given history, and more likely a mechanical fall, this is less likely. EEG in hospital negative for seizures. Continue Plavix and Lipitor outpatient. Continue Keppra outpatient.     #HFrEF with ejection fraction of 40 to 45% (03/03/2022) #Chronic hypotension Initially there was concern about possible syncopal episode causing the fall. After history was given, this is less likely.  Patient was started on low-dose midodrine 2.5 mg 3 times daily for chronic hypotension, which did improve blood pressures. Patient was no longer orthostatic from lying to sitting on day of discharge although BP remained on the lower side with MAP >65. Given IVF prior to discharge to replace likely stool losses.    #COPD, no exacerbation No concern about this in the hospital. Patient continued on home Pulmicort, albuterol, and incruse Ellipta.   #SCC of  the lung, stage IV -Patient received radiation therapy prior to admission on 08/26/2022, and had another radiation therapy during admission on 08/30/2022.   #Nicotine dependence -Nicotine patch provided during hospitalization    Discharge Subjective: Patient is resting comfortably in bed upon my exam.  Patient reports that he is doing fine.  He denies any chest pain, shortness of breath, abdominal pain, nausea, vomiting, fever, or chills.  Patient reports that he does not want to get up, but after some counseling, patient is agreeable to get up.  Patient's cousin, [name], is agreeable to pick the patient up whenever the patient is discharged.  Discharge Exam:   BP 107/78 (BP Location: Right Arm)   Pulse (!) 108   Temp 99.3 F (37.4 C) (Oral)   Resp 18    Ht 6' (1.829 m)   Wt 68.4 kg   SpO2 99%   BMI 20.45 kg/m  Constitutional: Resting in bed comfortably, in no acute distress HENT: normocephalic atraumatic, with noted c-collar in place Eyes: conjunctiva non-erythematous Cardiovascular: regular rate and rhythm, no m/r/g Abdominal: soft, non-tender, non-distended Neurological: Dysarthric speech noted  Pertinent Labs, Studies, and Procedures:     Latest Ref Rng & Units 08/30/2022    4:58 AM 08/29/2022    4:01 AM 08/28/2022    5:52 AM  CBC  WBC 4.0 - 10.5 K/uL 10.6  11.7  9.8   Hemoglobin 13.0 - 17.0 g/dL 9.5  9.0  9.5   Hematocrit 39.0 - 52.0 % 28.4  28.1  29.7   Platelets 150 - 400 K/uL 358  378  361        Latest Ref Rng & Units 08/30/2022    4:58 AM 08/29/2022    4:01 AM 08/28/2022    5:52 AM  CMP  Glucose 70 - 99 mg/dL 117  112  106   BUN 8 - 23 mg/dL 10  14  13    Creatinine 0.61 - 1.24 mg/dL 0.73  0.72  0.83   Sodium 135 - 145 mmol/L 136  134  136   Potassium 3.5 - 5.1 mmol/L 4.1  4.0  4.2   Chloride 98 - 111 mmol/L 99  101  101   CO2 22 - 32 mmol/L 27  24  24    Calcium 8.9 - 10.3 mg/dL 8.8  8.5  8.6     ECHOCARDIOGRAM COMPLETE BUBBLE STUDY  Result Date: 08/27/2022    ECHOCARDIOGRAM REPORT   Patient Name:   Todd Estrada Date of Exam: 08/27/2022 Medical Rec #:  [id number]     Height:       72.0 in Accession #:    [id number]    Weight:       165.0 lb Date of Birth:  08-Oct-1961     BSA:          1.963 m Patient Age:    6 years      BP:           96/64 mmHg Patient Gender: M             HR:           72 bpm. Exam Location:  Inpatient Procedure: 2D Echo, Cardiac Doppler, Color Doppler and [place] Contrast Bubble            Study Indications:    Syncope 780.2 / R55  History:        Patient has prior history of Echocardiogram examinations, most  recent 03/03/2022. CHF, CAD; COPD and Stroke. Unwitnessed fall.                 Cervical fracture. Necrotizing pneumonia. Lung squamous cell                 carcinoma stage IV.   Sonographer:    Darlina Sicilian RDCS Referring Phys: [id number] EMILY B MULLEN  Sonographer Comments: Suboptimal subcostal window, no parasternal window, no apical window and Technically difficult study due to poor echo windows. Limited patient mobility due to cervical fracture. Patient scanned while supine. IMPRESSIONS  1. Nondiagnostic echo, severely limited visualization and poor echo windows. . Left ventricular ejection fraction, by estimation, is unable to assess%. The left ventricle has cannot assess function. Left ventricular endocardial border not optimally defined to evaluate regional wall motion. The left ventricular internal cavity size was Not well visualized. not well visualized left ventricular hypertrophy. Left ventricular diastolic parameters are indeterminate.  2. Right ventricular systolic function was not well visualized. The right ventricular size is not well visualized.  3. The mitral valve was not well visualized. not well visualized mitral valve regurgitation. not well visualized mitral stenosis.  4. Tricuspid valve regurgitation not well visualized. not well visualized tricuspid stenosis.  5. The aortic valve was not well visualized. Aortic valve regurgitation not well visualized. not well visualized.  6. Pulmonic valve regurgitation not well visualized.  7. The inferior vena cava is normal in size with greater than 50% respiratory variability, suggesting right atrial pressure of 3 mmHg. FINDINGS  Left Ventricle: Nondiagnostic echo, severely limited visualization and poor echo windows. Left ventricular ejection fraction, by estimation, is unable to assess%. The left ventricle has cannot assess function. Left ventricular endocardial border not optimally defined to evaluate regional wall motion. The left ventricular internal cavity size was Not well visualized. Not well visualized left ventricular hypertrophy. Left ventricular diastolic parameters are indeterminate. Right Ventricle: The right  ventricular size is not well visualized. Right vetricular wall thickness was not well visualized. Right ventricular systolic function was not well visualized. Left Atrium: Left atrial size was not well visualized. Right Atrium: Right atrial size was not well visualized. Pericardium: The pericardium was not well visualized. Mitral Valve: The mitral valve was not well visualized. Not well visualized mitral valve regurgitation. Not well visualized mitral valve stenosis. Tricuspid Valve: The tricuspid valve is not well visualized. Tricuspid valve regurgitation not well visualized. not well visualized tricuspid stenosis. Aortic Valve: The aortic valve was not well visualized. Aortic valve regurgitation not well visualized. Not well visualized. Pulmonic Valve: The pulmonic valve was not well visualized. Pulmonic valve regurgitation not well visualized. Aorta: The aortic root is normal in size and structure. Venous: The inferior vena cava is normal in size with greater than 50% respiratory variability, suggesting right atrial pressure of 3 mmHg. IAS/Shunts: The interatrial septum was not well visualized. Agitated saline contrast was given intravenously to evaluate for intracardiac shunting.  LEFT VENTRICLE PLAX 2D LVOT diam:     2.40 cm LVOT Area:     4.52 cm   AORTA Ao Root diam: 3.50 cm  SHUNTS Systemic Diam: 2.40 cm Carlyle Dolly MD Electronically signed by Carlyle Dolly MD Signature Date/Time: 08/27/2022/11:51:43 AM    Final    EEG adult  Result Date: 08/27/2022 Lora Havens, MD     08/27/2022  8:19 AM Patient Name: Todd Estrada MRN: [id number] Epilepsy Attending: Lora Havens Referring Physician/Provider: [name], MD Date: 08/27/2022 Duration: 22.21 mins Patient history:  61 y.o. 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 EEG to evaluate for seizure Level of  alertness: Awake AEDs during EEG study: LEV Technical aspects: This EEG study was done with scalp electrodes positioned according to the 10-20 International system of electrode placement. Electrical activity was reviewed with band pass filter of 1-70Hz , sensitivity of 7 uV/mm, display speed of 45mm/sec with a 60Hz  notched filter applied as appropriate. EEG data were recorded continuously and digitally stored.  Video monitoring was available and reviewed as appropriate. Description: The posterior dominant rhythm consists of 8 Hz activity of moderate voltage (25-35 uV) seen predominantly in posterior head regions, symmetric and reactive to eye opening and eye closing. EEG showed continuous 3 to 6 Hz theta-delta slowing in left hemisphere. Hyperventilation and photic stimulation were not performed.   ABNORMALITY - Continuous slow, left hemisphere IMPRESSION: This study is suggestive of cortical dysfunction arising from left hemisphere likely secondary to underlying structural abnormality/stroke. No seizures or epileptiform discharges were seen throughout the recording. Lora Havens   DG Chest Portable 1 View  Result Date: 08/27/2022 CLINICAL DATA:  Unwitnessed fall at home EXAM: PORTABLE CHEST 1 VIEW COMPARISON:  08/12/2022 FINDINGS: Interstitial coarsening from emphysema. Left perihilar opacity which was airspace disease by CT. Vague nodular density in the right peripheral lung with adjacent fiducial marker. No hemothorax, pneumothorax, or lung contusion. Normal heart size and mediastinal contours IMPRESSION: No evidence of chest trauma. Stable compared to 08/12/2022 chest radiograph. Electronically Signed   By: Jorje Guild M.D.   On: 08/27/2022 04:58   DG Hips Bilat W or Wo Pelvis 3-4 Views  Result Date: 08/27/2022 CLINICAL DATA:  Unwitnessed fall at home with right-sided pain EXAM: DG HIP (WITH OR WITHOUT PELVIS) 4V BILAT COMPARISON:  None Available. FINDINGS: There is no evidence of hip fracture or  dislocation. No hip joint narrowing or spurring. IMPRESSION: Negative. Electronically Signed   By: Jorje Guild M.D.   On: 08/27/2022 04:42   CT Head Wo Contrast  Result Date: 08/27/2022 CLINICAL DATA:  Unwitnessed fall at nursing home EXAM: CT HEAD WITHOUT CONTRAST CT CERVICAL SPINE WITHOUT CONTRAST TECHNIQUE: Multidetector CT imaging of the head and cervical spine was performed following the standard protocol without intravenous contrast. Multiplanar CT image reconstructions of the cervical spine were also generated. RADIATION DOSE REDUCTION: This exam was performed according to the departmental dose-optimization program which includes automated exposure control, adjustment of the mA and/or kV according to patient size and/or use of iterative reconstruction technique. COMPARISON:  08/07/2022 FINDINGS: CT HEAD FINDINGS Brain: Chronic infarcts in the left cerebellum and even more extensively in the left MCA territory with there is dystrophic mineralization at the parietal lobe. Stable chronic white matter disease. No acute infarct, hemorrhage, hydrocephalus, or collection. Vascular: No hyperdense vessel or unexpected calcification. Skull: Normal. Negative for fracture or focal lesion. Sinuses/Orbits: No acute finding. CT CERVICAL SPINE FINDINGS Alignment: Normal Skull base and vertebrae: Nondisplaced transverse process fracture of C7 on the right. Soft tissues and spinal canal: No prevertebral fluid or swelling. No visible canal hematoma. Disc levels: Generalized spondylosis. Posterior endplate ridging most notable at C6-7. Mild ossification of the posterior longitudinal ligament. Upper chest: Biapical emphysema IMPRESSION: 1. Nondisplaced right transverse process fracture of C7. 2. No evidence of acute intracranial injury. 3. 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 Electronically Signed   By: Gilford Silvius.D.  On: 08/27/2022 04:14   CT Cervical Spine Wo Contrast  Result Date:  08/27/2022 CLINICAL DATA:  Unwitnessed fall at nursing home EXAM: CT HEAD WITHOUT CONTRAST CT CERVICAL SPINE WITHOUT CONTRAST TECHNIQUE: Multidetector CT imaging of the head and cervical spine was performed following the standard protocol without intravenous contrast. Multiplanar CT image reconstructions of the cervical spine were also generated. RADIATION DOSE REDUCTION: This exam was performed according to the departmental dose-optimization program which includes automated exposure control, adjustment of the mA and/or kV according to patient size and/or use of iterative reconstruction technique. COMPARISON:  08/07/2022 FINDINGS: CT HEAD FINDINGS Brain: Chronic infarcts in the left cerebellum and even more extensively in the left MCA territory with there is dystrophic mineralization at the parietal lobe. Stable chronic white matter disease. No acute infarct, hemorrhage, hydrocephalus, or collection. Vascular: No hyperdense vessel or unexpected calcification. Skull: Normal. Negative for fracture or focal lesion. Sinuses/Orbits: No acute finding. CT CERVICAL SPINE FINDINGS Alignment: Normal Skull base and vertebrae: Nondisplaced transverse process fracture of C7 on the right. Soft tissues and spinal canal: No prevertebral fluid or swelling. No visible canal hematoma. Disc levels: Generalized spondylosis. Posterior endplate ridging most notable at C6-7. Mild ossification of the posterior longitudinal ligament. Upper chest: Biapical emphysema IMPRESSION: 1. Nondisplaced right transverse process fracture of C7. 2. No evidence of acute intracranial injury. 3. 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 Electronically Signed   By: Jorje Guild M.D.   On: 08/27/2022 04:14     Discharge Instructions: Discharge Instructions     Call MD for:  persistant nausea and vomiting   Complete by: As directed    Call MD for:  severe uncontrolled pain   Complete by: As directed    Diet - low sodium heart healthy    Complete by: As directed    Increase activity slowly   Complete by: As directed       Lauretta Chester  It was a pleasure taking care of you at [facility] were admitted for further evaluation of a fall.  You also developed a stomach infection during your hospital stay.  Please follow the following instructions at home.  1) Regarding your diarrhea, this is likely due to an infection.  We are sending you home on oral antibiotics.  Please take these antibiotics for the next 8 days.  The antibiotic's name as vancomycin.  Please take take vancomycin orally 125 mg 4 times a day for the next 8 days.  2) Regarding your fall, the neurosurgeon says you do not need to wear your c-collar nor do you need to follow-up.  Please take Tylenol as needed for pain.  3) regarding your low blood pressure during hospitalization, we started you on midodrine which helped keep your blood pressure elevated.  Please continue to take midodrine 2.5 mg 3 times daily outpatient.  4) Regarding your squamous cell lung cancer, please continue to go to your chemoradiation treatments and follow-up with your oncologist.  5) Please follow-up with Dr. Jodell Cipro at the internal medicine center on 09/09/22 at 9:45 AM.  6) For the right eye irritation, please get some antihistamine eye drops from your local pharmacy and you can get lidocaine patches over the counter as well. Please use warm compresses as needed for the right eye irritation.   Take care,  Leigh Aurora, DO   Signed: Leigh Aurora, DO 08/30/2022, 4:28 PM   Pager: [phone]

## 2022-08-30 NOTE — TOC Transition Note (Signed)
Transition of Care [facility]) - CM/SW Discharge Note   Patient Details  Name: Todd Estrada MRN: [id number] Date of Birth: [birth year]  [facility] CM/SW Contact:  Zenon Mayo, RN Phone Number: 08/30/2022, 4:12 PM   Clinical Narrative:    Patient is for dc today, he lives with his relative Todd Estrada and his daughter, per patient gave this NCM  permission to call Todd Estrada.  Todd Estrada will transport patient home at [facility] , also Todd Estrada states he has 24 hr care at home between him and his daughter.  NCM notified Tommi Rumps with [facility] that patient is being discharged today.   Final next level of care: [place] Barriers to Discharge: No Barriers Identified   Patient Goals and CMS Choice Patient states their goals for this hospitalization and ongoing recovery are:: to go home CMS Medicare.gov Compare Post Acute Care list provided to:: Patient Represenative (must comment) Choice offered to / list presented to : Sibling  Discharge Placement                       Discharge Plan and Services In-house Referral: NA Discharge Planning Services: CM Consult Post Acute Care Choice: Home Health            DME Agency: NA       HH Arranged: PT, OT, Speech Therapy HH Agency: [place] Date [facility] Agency Contacted: 08/30/22 Time [place]: [phone] Representative spoke with at [place]: [place]) Interventions     Readmission Risk Interventions    08/30/2022    3:39 PM 08/16/2022    4:28 PM  Readmission Risk Prevention Plan  Transportation Screening Complete Complete  Medication Review [employer] photographer) Complete Complete  PCP or Specialist appointment within 3-5 days of discharge Complete   HRI or [place] Complete Complete  SW Recovery Care/Counseling Consult Complete Complete  Palliative Care Screening Not Applicable Not [place] Not Applicable Not Applicable
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
## 2022-08-31 ENCOUNTER — Ambulatory Visit: Payer: Medicaid Other | Admitting: Radiation Oncology

## 2022-08-31 ENCOUNTER — Telehealth: Payer: Self-pay

## 2022-08-31 NOTE — Patient Outreach (Signed)
  Care Coordination [facility] Note Transition Care Management Unsuccessful Follow-up Telephone Call  Date of discharge and from [name] 08/27/22-9/12-23  Attempts:  1st Attempt  Reason for unsuccessful TCM follow-up call:  No answer/busy  Johnney Killian, RN, BSN, CCM Care Management Coordinator [facility] Phone: [phone]: [phone]

## 2022-09-01 ENCOUNTER — Telehealth: Payer: Self-pay

## 2022-09-01 ENCOUNTER — Other Ambulatory Visit: Payer: Self-pay

## 2022-09-01 ENCOUNTER — Ambulatory Visit: Payer: Medicaid Other

## 2022-09-01 ENCOUNTER — Encounter: Payer: Self-pay | Admitting: Radiation Oncology

## 2022-09-01 ENCOUNTER — Ambulatory Visit
Admission: RE | Admit: 2022-09-01 | Discharge: 2022-09-01 | Disposition: A | Discharge status: Home / Self Care | Payer: Medicaid Other | Source: Ambulatory Visit | Attending: Radiation Oncology | Admitting: Radiation Oncology

## 2022-09-01 DIAGNOSIS — C3412 Malignant neoplasm of upper lobe, left bronchus or lung: Secondary | ICD-10-CM | POA: Diagnosis present

## 2022-09-01 DIAGNOSIS — C3411 Malignant neoplasm of upper lobe, right bronchus or lung: Secondary | ICD-10-CM | POA: Diagnosis present

## 2022-09-01 DIAGNOSIS — Z510 Encounter for antineoplastic radiation therapy: Secondary | ICD-10-CM | POA: Diagnosis not present

## 2022-09-01 DIAGNOSIS — Z5111 Encounter for antineoplastic chemotherapy: Secondary | ICD-10-CM | POA: Diagnosis not present

## 2022-09-01 DIAGNOSIS — Z87891 Personal history of nicotine dependence: Secondary | ICD-10-CM | POA: Diagnosis not present

## 2022-09-01 LAB — CULTURE, BLOOD (ROUTINE X 2)
Culture: NO GROWTH
Culture: NO GROWTH
Special Requests: ADEQUATE
Special Requests: ADEQUATE

## 2022-09-01 LAB — RAD ONC ARIA SESSION SUMMARY
Course Elapsed Days: 59
Plan Fractions Treated to Date: 3
Plan Prescribed Dose Per Fraction: 18 Gy
Plan Total Fractions Prescribed: 3
Plan Total Prescribed Dose: 54 Gy
Reference Point Dosage Given to Date: 54 Gy
Reference Point Session Dosage Given: 18 Gy
Session Number: 36

## 2022-09-01 NOTE — Patient Outreach (Signed)
  Care Coordination [facility] Note Transition Care Management Follow-up Telephone Call Date of discharge and from where: Todd Estrada 08/27/22-08/30/22 How have you been since you were released from the hospital? Per Todd Estrada who lives with patient, he is doing, "pretty good". Any questions or concerns? No  Items Reviewed: Did the pt receive and understand the discharge instructions provided? Yes  Medications obtained and verified? Yes  Other? No  Any new allergies since your discharge? No  Dietary orders reviewed? Yes Do you have support at home? Yes   Home Care and Equipment/Supplies: Were home health services ordered? yes If so, what is the name of the agency? [facility]  Has the agency set up a time to come to the patient's home? yes Were any new equipment or medical supplies ordered?  No What is the name of the medical supply agency? N/A Were you able to get the supplies/equipment? not applicable Do you have any questions related to the use of the equipment or supplies? No  Functional Questionnaire: (I = Independent and D = Dependent) ADLs: I  Bathing/Dressing- D  Meal Prep- D  Eating- I  Maintaining continence- I  Transferring/Ambulation- D  Managing Meds- D  Follow up appointments reviewed:  PCP Hospital f/u appt confirmed? Yes  Scheduled to see Dr. Jodell Cipro on 09/09/22 @ 0945. [place] [facility] f/u appt confirmed? No   Are transportation arrangements needed? No  If their condition worsens, is the pt aware to call PCP or go to the Emergency Dept.? Yes Was the patient provided with contact information for the PCP's office or ED? Yes Was to pt encouraged to call back with questions or concerns? Yes  SDOH assessments and interventions completed:   Yes  Care Coordination Interventions Activated:  Yes   Care Coordination Interventions:   No further interventions indicated     Encounter Outcome:  Pt. Visit Completed

## 2022-09-02 ENCOUNTER — Telehealth: Payer: Self-pay | Admitting: Student

## 2022-09-02 ENCOUNTER — Ambulatory Visit: Payer: Medicaid Other | Admitting: Radiation Oncology

## 2022-09-02 NOTE — Telephone Encounter (Signed)
Rec'd call from Charyl Dancer OT from [facility] requesting a DME Order for the pt.  The pt is unable to use a regular commode safely.  DME order requested  Drop Down Arm Rest Commode

## 2022-09-02 NOTE — Telephone Encounter (Signed)
Please place DME order for Bedside Commode and write drop arm commode in Comments. Thank you.

## 2022-09-05 ENCOUNTER — Other Ambulatory Visit: Payer: Self-pay | Admitting: Radiation Oncology

## 2022-09-05 ENCOUNTER — Other Ambulatory Visit: Payer: Self-pay | Admitting: Internal Medicine

## 2022-09-05 ENCOUNTER — Encounter: Payer: Self-pay | Admitting: Internal Medicine

## 2022-09-05 DIAGNOSIS — R296 Repeated falls: Secondary | ICD-10-CM

## 2022-09-05 NOTE — Progress Notes (Signed)
                                                                                                                                                             Patient Name: Todd Estrada MRN: [id number] DOB: 1961/02/20 Referring Physician: Baltazar Apo (Profile Not Attached) Date of Service: 09/01/2022 [facility] [place]                                                        End Of Treatment Note  Diagnoses: C34.11-Malignant neoplasm of upper lobe, right bronchus or lung C34.12-Malignant neoplasm of upper lobe, left bronchus or lung  Cancer Staging: Synchronous Stage IB, cT2aN0M0, NSCLC favor squamous cell carcinoma of the LUL and Stage IA2, cT1bN0M0, NSCLC, favor squamous cell carcinoma of the RUL.  Intent: Curative  Radiation Treatment Dates:  07/04/2022 through 09/01/2022 SBRT Treatment Site Technique Total Dose (Gy) Dose per Fx (Gy) Completed Fx Beam Energies  Lung, Right: Lung_R_RUL IMRT 54/54 18 3/3 6XFFF   Narrative: The patient tolerated radiation therapy relatively well. He noted fatigue but no esophagitis.  Plan: The patient will receive a call in about one month from the radiation oncology department. We will plan repeat CT in about 3 months time.  ________________________________________________    Carola Rhine, PAC

## 2022-09-05 NOTE — Telephone Encounter (Signed)
Received call from Sherry Ruffing, [place] with [facility]. Requesting VO for HH OT 1 week 4 to work on ADLs, transfers, exercise program, and non-pharmacological pain control. Verbal auth given. Will route to Red Team for agreement/denial.   Notified Timmothy Sours that order for drop arm commode was placed today and CM has been sent to [employer] at [place].

## 2022-09-07 ENCOUNTER — Telehealth: Payer: Self-pay | Admitting: Student
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
## 2022-09-07 NOTE — Telephone Encounter (Signed)
Received call from Gypsy Decant, PT with [facility]. Requesting VO for [facility] PT 1 time a week for 3 weeks, Medical Social Work Evaluation.  Per Shanon Brow PT, He is reporting the patient is not in a safe Environment .  Pt possibly needs Placement to a [place].  Please call  [phone]

## 2022-09-09 ENCOUNTER — Ambulatory Visit: Payer: Medicaid Other | Admitting: Internal Medicine

## 2022-09-09 ENCOUNTER — Other Ambulatory Visit: Payer: Self-pay

## 2022-09-09 VITALS — BP 84/66 | HR 100 | Temp 98.0°F | Ht 73.0 in | Wt 160.0 lb

## 2022-09-09 DIAGNOSIS — S129XXD Fracture of neck, unspecified, subsequent encounter: Secondary | ICD-10-CM | POA: Diagnosis not present

## 2022-09-09 DIAGNOSIS — A0472 Enterocolitis due to Clostridium difficile, not specified as recurrent: Secondary | ICD-10-CM

## 2022-09-09 DIAGNOSIS — D649 Anemia, unspecified: Secondary | ICD-10-CM | POA: Diagnosis present

## 2022-09-09 DIAGNOSIS — R296 Repeated falls: Secondary | ICD-10-CM | POA: Diagnosis not present

## 2022-09-09 NOTE — Patient Instructions (Signed)
[name], it was a pleasure seeing you today! You endorsed feeling well today. Below are some of the things we talked about this visit. We look forward to seeing you in the follow up appointment!  Today we discussed: [name], it was a pleasure meeting you. I am sorry you are going through a lot. I will follow up on palliative care consult which is our team that ensure pts with severe illness are comfortable. Your diarrhea has resolved which was causes by C. Diff infection.  If you are in pain or have any other needs, let our clinic know.    I have ordered the following labs today:   Lab Orders         CBC with Diff         BMP8+Anion Gap       Referrals ordered today:   Referral Orders  No referral(s) requested today     I have ordered the following medication/changed the following medications:   Stop the following medications: There are no discontinued medications.   Start the following medications: No orders of the defined types were placed in this encounter.    Follow-up: 3 month follow up or early as needed.   Please make sure to arrive 15 minutes prior to your next appointment. If you arrive late, you may be asked to reschedule.   We look forward to seeing you next time. Please call our clinic at [phone] if you have any questions or concerns. The best time to call is Monday-Friday from 9am-4pm, but there is someone available 24/7. If after hours or the weekend, call the main hospital number and ask for the Internal Medicine Resident On-Call. If you need medication refills, please notify your pharmacy one week in advance and they will send [place] a request.  Thank you for letting us take part in your care. Wishing you the best!  Thank you, Idamae Schuller, MD
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
## 2022-09-10 ENCOUNTER — Encounter: Payer: Self-pay | Admitting: Internal Medicine

## 2022-09-10 DIAGNOSIS — S129XXA Fracture of neck, unspecified, initial encounter: Secondary | ICD-10-CM | POA: Insufficient documentation

## 2022-09-10 DIAGNOSIS — A0472 Enterocolitis due to Clostridium difficile, not specified as recurrent: Secondary | ICD-10-CM | POA: Insufficient documentation

## 2022-09-10 LAB — BMP8+ANION GAP
Anion Gap: 15 mmol/L (ref 10.0–18.0)
BUN/Creatinine Ratio: 25 — ABNORMAL HIGH (ref 10–24)
BUN: 16 mg/dL (ref 8–27)
CO2: 25 mmol/L (ref 20–29)
Calcium: 9.5 mg/dL (ref 8.6–10.2)
Chloride: 100 mmol/L (ref 96–106)
Creatinine, Ser: 0.63 mg/dL — ABNORMAL LOW (ref 0.76–1.27)
Glucose: 95 mg/dL (ref 70–99)
Potassium: 4.2 mmol/L (ref 3.5–5.2)
Sodium: 140 mmol/L (ref 134–144)
eGFR: 108 mL/min/{1.73_m2} (ref >59)

## 2022-09-10 LAB — CBC WITH DIFFERENTIAL/PLATELET
Basophils Absolute: 0.1 10*3/uL (ref 0.0–0.2)
Basos: 0 %
EOS (ABSOLUTE): 0.1 10*3/uL (ref 0.0–0.4)
Eos: 1 %
Hematocrit: 31.5 % — ABNORMAL LOW (ref 37.5–51.0)
Hemoglobin: 10.4 g/dL — ABNORMAL LOW (ref 13.0–17.7)
Immature Grans (Abs): 0.1 10*3/uL (ref 0.0–0.1)
Immature Granulocytes: 1 %
Lymphocytes Absolute: 0.6 10*3/uL — ABNORMAL LOW (ref 0.7–3.1)
Lymphs: 6 %
MCH: 30.4 pg (ref 26.6–33.0)
MCHC: 33 g/dL (ref 31.5–35.7)
MCV: 92 fL (ref 79–97)
Monocytes Absolute: 0.7 10*3/uL (ref 0.1–0.9)
Monocytes: 6 %
Neutrophils Absolute: 10 10*3/uL — ABNORMAL HIGH (ref 1.4–7.0)
Neutrophils: 86 %
Platelets: 428 10*3/uL (ref 150–450)
RBC: 3.42 x10E6/uL — ABNORMAL LOW (ref 4.14–5.80)
RDW: 14.7 % (ref 11.6–15.4)
WBC: 11.5 10*3/uL — ABNORMAL HIGH (ref 3.4–10.8)

## 2022-09-10 NOTE — Assessment & Plan Note (Signed)
Noted on imaging after the fall. Neurosurgery consulted and don't recommend follow up. No TTP of the cervical spine.

## 2022-09-10 NOTE — Assessment & Plan Note (Addendum)
Pt admitted after a fall. Noted to have non-displaced fracture of C7. Seen by neurosurgery and no follow up needed. No TTP on exam. Pt noted to be hypotensive which be have lead to his fall vs his severe weakness from prior stroke. Pt is adherent to midodrine TID. Not endorsing any dizziness, CP, or shortness of breath. Pt did develop C. Difficile infection but his symptoms have resolved with oral vancomycin. Pt overall in poor status from stroke and was diagnosed with carcinoma of the lung. In the last 9 months, pt had 6 ED visits with 3 admissions. He has been declining after his stroke. Pt is receiving home health including speech, PT, and OT. Ambulatory palliative care consult was placed and will follow up on this as pt high risk for deterioration and repeat hospitalizations.

## 2022-09-12 ENCOUNTER — Telehealth: Payer: Self-pay | Admitting: *Deleted

## 2022-09-12 NOTE — Telephone Encounter (Signed)
Received call from [place], Speech Therapist with [facility] reporting patient's pulse is 122. He just returned home from funeral. BP 112/78. O2Sat 93% on RA. He has no symptoms, and "seems fine but is mostly nonverbal."

## 2022-09-13 ENCOUNTER — Other Ambulatory Visit: Payer: Self-pay

## 2022-09-13 ENCOUNTER — Emergency Department (HOSPITAL_COMMUNITY): Payer: Medicaid Other

## 2022-09-13 ENCOUNTER — Encounter (HOSPITAL_COMMUNITY): Payer: Self-pay | Admitting: *Deleted

## 2022-09-13 ENCOUNTER — Observation Stay (HOSPITAL_COMMUNITY)
Admission: EM | Admit: 2022-09-13 | Discharge: 2022-09-16 | Disposition: A | Discharge status: Home / Self Care | Payer: Medicaid Other | Attending: Internal Medicine | Admitting: Internal Medicine

## 2022-09-13 DIAGNOSIS — Z20822 Contact with and (suspected) exposure to covid-19: Secondary | ICD-10-CM | POA: Diagnosis present

## 2022-09-13 DIAGNOSIS — I5022 Chronic systolic (congestive) heart failure: Secondary | ICD-10-CM | POA: Diagnosis present

## 2022-09-13 DIAGNOSIS — Z808 Family history of malignant neoplasm of other organs or systems: Secondary | ICD-10-CM

## 2022-09-13 DIAGNOSIS — R0781 Pleurodynia: Secondary | ICD-10-CM | POA: Insufficient documentation

## 2022-09-13 DIAGNOSIS — Z923 Personal history of irradiation: Secondary | ICD-10-CM | POA: Insufficient documentation

## 2022-09-13 DIAGNOSIS — Y95 Nosocomial condition: Secondary | ICD-10-CM | POA: Diagnosis present

## 2022-09-13 DIAGNOSIS — I5020 Unspecified systolic (congestive) heart failure: Secondary | ICD-10-CM | POA: Insufficient documentation

## 2022-09-13 DIAGNOSIS — D649 Anemia, unspecified: Secondary | ICD-10-CM | POA: Insufficient documentation

## 2022-09-13 DIAGNOSIS — R519 Headache, unspecified: Secondary | ICD-10-CM | POA: Diagnosis not present

## 2022-09-13 DIAGNOSIS — Z7902 Long term (current) use of antithrombotics/antiplatelets: Secondary | ICD-10-CM

## 2022-09-13 DIAGNOSIS — I69359 Hemiplegia and hemiparesis following cerebral infarction affecting unspecified side: Secondary | ICD-10-CM | POA: Insufficient documentation

## 2022-09-13 DIAGNOSIS — I700 Atherosclerosis of aorta: Secondary | ICD-10-CM | POA: Diagnosis present

## 2022-09-13 DIAGNOSIS — J432 Centrilobular emphysema: Secondary | ICD-10-CM | POA: Insufficient documentation

## 2022-09-13 DIAGNOSIS — J189 Pneumonia, unspecified organism: Secondary | ICD-10-CM | POA: Diagnosis present

## 2022-09-13 DIAGNOSIS — I69398 Other sequelae of cerebral infarction: Secondary | ICD-10-CM | POA: Insufficient documentation

## 2022-09-13 DIAGNOSIS — A419 Sepsis, unspecified organism: Secondary | ICD-10-CM | POA: Diagnosis present

## 2022-09-13 DIAGNOSIS — W19XXXS Unspecified fall, sequela: Secondary | ICD-10-CM | POA: Diagnosis not present

## 2022-09-13 DIAGNOSIS — Z66 Do not resuscitate: Secondary | ICD-10-CM | POA: Insufficient documentation

## 2022-09-13 DIAGNOSIS — I9589 Other hypotension: Secondary | ICD-10-CM | POA: Insufficient documentation

## 2022-09-13 DIAGNOSIS — D638 Anemia in other chronic diseases classified elsewhere: Secondary | ICD-10-CM | POA: Diagnosis present

## 2022-09-13 DIAGNOSIS — Z7951 Long term (current) use of inhaled steroids: Secondary | ICD-10-CM

## 2022-09-13 DIAGNOSIS — I69322 Dysarthria following cerebral infarction: Secondary | ICD-10-CM | POA: Diagnosis not present

## 2022-09-13 DIAGNOSIS — J850 Gangrene and necrosis of lung: Secondary | ICD-10-CM | POA: Diagnosis present

## 2022-09-13 DIAGNOSIS — Z79899 Other long term (current) drug therapy: Secondary | ICD-10-CM

## 2022-09-13 DIAGNOSIS — C3411 Malignant neoplasm of upper lobe, right bronchus or lung: Secondary | ICD-10-CM | POA: Diagnosis not present

## 2022-09-13 DIAGNOSIS — S12600S Unspecified displaced fracture of seventh cervical vertebra, sequela: Secondary | ICD-10-CM | POA: Insufficient documentation

## 2022-09-13 DIAGNOSIS — Z1152 Encounter for screening for COVID-19: Secondary | ICD-10-CM | POA: Insufficient documentation

## 2022-09-13 DIAGNOSIS — I110 Hypertensive heart disease with heart failure: Secondary | ICD-10-CM | POA: Diagnosis present

## 2022-09-13 DIAGNOSIS — R569 Unspecified convulsions: Secondary | ICD-10-CM | POA: Insufficient documentation

## 2022-09-13 DIAGNOSIS — Z9221 Personal history of antineoplastic chemotherapy: Secondary | ICD-10-CM

## 2022-09-13 DIAGNOSIS — C3412 Malignant neoplasm of upper lobe, left bronchus or lung: Secondary | ICD-10-CM | POA: Diagnosis present

## 2022-09-13 DIAGNOSIS — I2782 Chronic pulmonary embolism: Secondary | ICD-10-CM | POA: Diagnosis present

## 2022-09-13 DIAGNOSIS — I69351 Hemiplegia and hemiparesis following cerebral infarction affecting right dominant side: Secondary | ICD-10-CM

## 2022-09-13 DIAGNOSIS — I2510 Atherosclerotic heart disease of native coronary artery without angina pectoris: Secondary | ICD-10-CM | POA: Diagnosis present

## 2022-09-13 LAB — COMPREHENSIVE METABOLIC PANEL
ALT: 13 U/L (ref 0–44)
AST: 13 U/L — ABNORMAL LOW (ref 15–41)
Albumin: 3.2 g/dL — ABNORMAL LOW (ref 3.5–5.0)
Alkaline Phosphatase: 91 U/L (ref 38–126)
Anion gap: 11 (ref 5–15)
BUN: 16 mg/dL (ref 8–23)
CO2: 26 mmol/L (ref 22–32)
Calcium: 9.3 mg/dL (ref 8.9–10.3)
Chloride: 99 mmol/L (ref 98–111)
Creatinine, Ser: 0.68 mg/dL (ref 0.61–1.24)
GFR, Estimated: >60 mL/min (ref >60)
Glucose, Bld: 122 mg/dL — ABNORMAL HIGH (ref 70–99)
Potassium: 4.5 mmol/L (ref 3.5–5.1)
Sodium: 136 mmol/L (ref 135–145)
Total Bilirubin: 0.5 mg/dL (ref 0.3–1.2)
Total Protein: 7.2 g/dL (ref 6.5–8.1)

## 2022-09-13 LAB — CBC WITH DIFFERENTIAL/PLATELET
Abs Immature Granulocytes: 0.07 10*3/uL (ref 0.00–0.07)
Basophils Absolute: 0.1 10*3/uL (ref 0.0–0.1)
Basophils Relative: 0 %
Eosinophils Absolute: 0 10*3/uL (ref 0.0–0.5)
Eosinophils Relative: 0 %
HCT: 31.6 % — ABNORMAL LOW (ref 39.0–52.0)
Hemoglobin: 10.1 g/dL — ABNORMAL LOW (ref 13.0–17.0)
Immature Granulocytes: 1 %
Lymphocytes Relative: 6 %
Lymphs Abs: 0.8 10*3/uL (ref 0.7–4.0)
MCH: 31.4 pg (ref 26.0–34.0)
MCHC: 32 g/dL (ref 30.0–36.0)
MCV: 98.1 fL (ref 80.0–100.0)
Monocytes Absolute: 1 10*3/uL (ref 0.1–1.0)
Monocytes Relative: 7 %
Neutro Abs: 12.1 10*3/uL — ABNORMAL HIGH (ref 1.7–7.7)
Neutrophils Relative %: 86 %
Platelets: 363 10*3/uL (ref 150–400)
RBC: 3.22 MIL/uL — ABNORMAL LOW (ref 4.22–5.81)
RDW: 16.5 % — ABNORMAL HIGH (ref 11.5–15.5)
WBC: 14.1 10*3/uL — ABNORMAL HIGH (ref 4.0–10.5)
nRBC: 0 % (ref 0.0–0.2)

## 2022-09-13 LAB — MAGNESIUM: Magnesium: 2 mg/dL (ref 1.7–2.4)

## 2022-09-13 LAB — SARS CORONAVIRUS 2 BY RT PCR: SARS Coronavirus 2 by RT PCR: NEGATIVE

## 2022-09-13 LAB — LACTIC ACID, PLASMA: Lactic Acid, Venous: 1.4 mmol/L (ref 0.5–1.9)

## 2022-09-13 NOTE — ED Provider Triage Note (Signed)
Emergency Medicine Provider Triage Evaluation Note  Todd Estrada , a 61 y.o. male  was evaluated in triage.  Pt complains of patient presents to the ER for evaluation of cough and shortness of breath.  Recently hospitalized with pneumonia.  Patient not currently on antibiotics.  Patient denies any chest pain.  Denies fever.  Patient reports 5/10 pain in ribs when taking a deep breath.  History of lung cancer.  Review of Systems  Positive: Shortness of breath, cough Negative: Fever or chest pain  Physical Exam  BP 112/82 (BP Location: Left Arm)   Pulse (!) 115   Temp 99.3 F (37.4 C) (Oral)   Resp 18   SpO2 98%  Gen:   Awake, no distress   Resp:  Normal effort , coarse lung sounds with decreased breath sounds in all lung fields MSK:   Moves extremities without difficulty  Other:    Medical Decision Making  Medically screening exam initiated at 5:29 PM.  Appropriate orders placed.  [name] was informed that the remainder of the evaluation will be completed by another provider, this initial triage assessment does not replace that evaluation, and the importance of remaining in the ED until their evaluation is complete.   Patient presents for cough and shortness of breath.  Recent admission for pneumonia.  Patient is tachycardic but afebrile.  No hypoxia noted.  Labs and imaging pending at this time.   Doristine Devoid, PA-C 09/13/22 1731

## 2022-09-13 NOTE — ED Triage Notes (Addendum)
Pt is able to give yes and no answers but I am unable to understand him when he speaks, asked him for permission to call his cousin to get triage infomration and pt said "yes" cousin states that pt was recently hospitalized for pneumonia and has continued to have a cough for the last week as well as sob.  No fever.pain 5/10 in ribs when taking a deep breath.  Per family member pt has lung CA

## 2022-09-13 NOTE — Progress Notes (Signed)
Internal Medicine Clinic Attending  Case discussed with the resident at the time of the visit.  We reviewed the resident's history and exam and pertinent patient test results.  I agree with the assessment, diagnosis, and plan of care documented in the resident's note.  

## 2022-09-14 ENCOUNTER — Encounter (HOSPITAL_COMMUNITY): Payer: Self-pay | Admitting: Internal Medicine

## 2022-09-14 ENCOUNTER — Observation Stay (HOSPITAL_COMMUNITY): Payer: Medicaid Other

## 2022-09-14 DIAGNOSIS — I5020 Unspecified systolic (congestive) heart failure: Secondary | ICD-10-CM

## 2022-09-14 DIAGNOSIS — R569 Unspecified convulsions: Secondary | ICD-10-CM

## 2022-09-14 DIAGNOSIS — J449 Chronic obstructive pulmonary disease, unspecified: Secondary | ICD-10-CM

## 2022-09-14 DIAGNOSIS — I9589 Other hypotension: Secondary | ICD-10-CM | POA: Diagnosis not present

## 2022-09-14 DIAGNOSIS — J189 Pneumonia, unspecified organism: Secondary | ICD-10-CM | POA: Diagnosis present

## 2022-09-14 DIAGNOSIS — Z87891 Personal history of nicotine dependence: Secondary | ICD-10-CM

## 2022-09-14 HISTORY — DX: Pneumonia, unspecified organism: J18.9

## 2022-09-14 LAB — STREP PNEUMONIAE URINARY ANTIGEN: Strep Pneumo Urinary Antigen: NEGATIVE

## 2022-09-14 LAB — TROPONIN I (HIGH SENSITIVITY)
Troponin I (High Sensitivity): 4 ng/L (ref <18)
Troponin I (High Sensitivity): 4 ng/L (ref <18)

## 2022-09-14 LAB — LACTIC ACID, PLASMA: Lactic Acid, Venous: 1 mmol/L (ref 0.5–1.9)

## 2022-09-14 MED ORDER — ARFORMOTEROL TARTRATE 15 MCG/2ML IN NEBU
15.0000 ug | INHALATION_SOLUTION | Freq: Two times a day (BID) | RESPIRATORY_TRACT | Status: DC
  Administered 2022-09-14 – 2022-09-16 (×5): 15 ug via RESPIRATORY_TRACT
  Filled 2022-09-14 (×6): qty 2

## 2022-09-14 MED ORDER — ACETAMINOPHEN 325 MG PO TABS
650.0000 mg | ORAL_TABLET | Freq: Four times a day (QID) | ORAL | Status: DC | PRN
  Administered 2022-09-15 (×2): 650 mg via ORAL
  Filled 2022-09-14 (×2): qty 2

## 2022-09-14 MED ORDER — ALBUTEROL SULFATE (2.5 MG/3ML) 0.083% IN NEBU: 2.5000 mg | INHALATION_SOLUTION | RESPIRATORY_TRACT | Status: DC | PRN

## 2022-09-14 MED ORDER — ONDANSETRON HCL 4 MG/2ML IJ SOLN: 4.0000 mg | Freq: Four times a day (QID) | INTRAMUSCULAR | Status: DC | PRN

## 2022-09-14 MED ORDER — IOHEXOL 350 MG/ML SOLN
70.0000 mL | Freq: Once | INTRAVENOUS | Status: AC | PRN
  Administered 2022-09-14: 70 mL via INTRAVENOUS

## 2022-09-14 MED ORDER — ENOXAPARIN SODIUM 40 MG/0.4ML IJ SOSY
40.0000 mg | PREFILLED_SYRINGE | Freq: Every day | INTRAMUSCULAR | Status: DC
  Administered 2022-09-14 – 2022-09-16 (×3): 40 mg via SUBCUTANEOUS
  Filled 2022-09-14 (×3): qty 0.4

## 2022-09-14 MED ORDER — GUAIFENESIN ER 600 MG PO TB12: 600.0000 mg | ORAL_TABLET | Freq: Two times a day (BID) | ORAL | Status: DC | PRN

## 2022-09-14 MED ORDER — SODIUM CHLORIDE 0.9 % IV SOLN
2.0000 g | Freq: Three times a day (TID) | INTRAVENOUS | Status: DC
  Administered 2022-09-14 – 2022-09-16 (×7): 2 g via INTRAVENOUS
  Filled 2022-09-14 (×7): qty 12.5

## 2022-09-14 MED ORDER — SODIUM CHLORIDE 0.9 % IV SOLN: 1.0000 g | Freq: Once | INTRAVENOUS | Status: DC

## 2022-09-14 MED ORDER — UMECLIDINIUM BROMIDE 62.5 MCG/ACT IN AEPB
1.0000 | INHALATION_SPRAY | Freq: Every day | RESPIRATORY_TRACT | Status: DC
  Administered 2022-09-14 – 2022-09-16 (×3): 1 via RESPIRATORY_TRACT
  Filled 2022-09-14 (×2): qty 7

## 2022-09-14 MED ORDER — ACETAMINOPHEN 650 MG RE SUPP: 650.0000 mg | Freq: Four times a day (QID) | RECTAL | Status: DC | PRN

## 2022-09-14 MED ORDER — MIDODRINE HCL 5 MG PO TABS
2.5000 mg | ORAL_TABLET | Freq: Three times a day (TID) | ORAL | Status: DC
  Administered 2022-09-14 – 2022-09-16 (×8): 2.5 mg via ORAL
  Filled 2022-09-14 (×9): qty 1

## 2022-09-14 MED ORDER — SODIUM CHLORIDE 0.9 % IV SOLN
500.0000 mg | Freq: Once | INTRAVENOUS | Status: AC
  Administered 2022-09-14: 500 mg via INTRAVENOUS
  Filled 2022-09-14: qty 5

## 2022-09-14 MED ORDER — LACTATED RINGERS IV SOLN: INTRAVENOUS | Status: AC

## 2022-09-14 MED ORDER — SENNOSIDES-DOCUSATE SODIUM 8.6-50 MG PO TABS: 1.0000 | ORAL_TABLET | Freq: Every evening | ORAL | Status: DC | PRN

## 2022-09-14 MED ORDER — LEVETIRACETAM 500 MG PO TABS
500.0000 mg | ORAL_TABLET | Freq: Two times a day (BID) | ORAL | Status: DC
  Administered 2022-09-14 – 2022-09-16 (×5): 500 mg via ORAL
  Filled 2022-09-14 (×5): qty 1

## 2022-09-14 MED ORDER — LIDOCAINE 5 % EX PTCH
1.0000 | MEDICATED_PATCH | Freq: Every day | CUTANEOUS | Status: DC
  Administered 2022-09-14 – 2022-09-16 (×3): 1 via TRANSDERMAL
  Filled 2022-09-14 (×3): qty 1

## 2022-09-14 MED ORDER — ONDANSETRON HCL 4 MG PO TABS: 4.0000 mg | ORAL_TABLET | Freq: Four times a day (QID) | ORAL | Status: DC | PRN

## 2022-09-14 MED ORDER — ATORVASTATIN CALCIUM 40 MG PO TABS
40.0000 mg | ORAL_TABLET | Freq: Every day | ORAL | Status: DC
  Administered 2022-09-14 – 2022-09-16 (×3): 40 mg via ORAL
  Filled 2022-09-14 (×3): qty 1

## 2022-09-14 MED ORDER — CLOPIDOGREL BISULFATE 75 MG PO TABS
75.0000 mg | ORAL_TABLET | Freq: Every day | ORAL | Status: DC
  Administered 2022-09-14 – 2022-09-16 (×3): 75 mg via ORAL
  Filled 2022-09-14 (×3): qty 1

## 2022-09-14 NOTE — Evaluation (Signed)
Occupational Therapy Evaluation Patient Details Name: Todd Estrada MRN: [id number] DOB: 1961-09-16 Today's Date: 09/14/2022   History of Present Illness Todd Estrada is a 61 y.o. male admitted under observation 09/13/22 due to persistent cough. CT of chest pending. Of note: recent hospitalizations on 9/9-11/2022 after an unwitnessed fall at home found to have a stable non-displaced fracture of C7/chest pain/cdiff. And on on 8/25 for sepsis 2/2 necrotizing PNA. CT head reveals nondisplaced R transverse process fx of C7. PMH includes PNA, COPD, CHF, CVA with aphasia and hemiplegia, lung SCC stage IV, seizures.   Clinical Impression   Per chart review, Pt has been living in his cousin's basement, mostly bed bound, frequent falls gets help for ADL/IADL. Today he presents with decreased activity tolerance, balance, safety awareness. Significant aphasia/dysarthria which makes communication difficult to establish baseline.  He is mod A +2 for sit<>stand and small steps up the ED stretcher. RUE has a wrist splint and is stronger proximally, still significantly impacted from previous CVA. PROM WFL in hand for cleaning/hygiene. Pt requires continued OT in the acute setting as well as afterwards at the SNF level to maximize safety and independence in ADL and functional transfers. Family may need to consider LTC.      Recommendations for follow up therapy are one component of a multi-disciplinary discharge planning process, led by the attending physician.  Recommendations may be updated based on patient status, additional functional criteria and insurance authorization.   Follow Up Recommendations  Skilled nursing-short term rehab (<3 hours/day)    Assistance Recommended at Discharge Frequent or constant Supervision/Assistance  Patient can return home with the following A lot of help with walking and/or transfers;A lot of help with bathing/dressing/bathroom    Functional Status Assessment  Patient has had a  recent decline in their functional status and demonstrates the ability to make significant improvements in function in a reasonable and predictable amount of time.  Equipment Recommendations  Other (comment);Hospital bed (if he declines SNF)    Recommendations for Other Services PT consult;Speech consult     Precautions / Restrictions Precautions Precautions: Fall Precaution Comments: aphasia Cervical Brace:  (no brace - couldn't understand Pt to see if still required by MD) Other Brace: AFO RLE, brace RUE, no neck brace in room Restrictions Weight Bearing Restrictions: No      Mobility Bed Mobility Overal bed mobility: Needs Assistance Bed Mobility: Supine to Sit, Sit to Supine     Supine to sit: Min assist Sit to supine: Min assist   General bed mobility comments: assist for trunk and LE    Transfers Overall transfer level: Needs assistance Equipment used: 1 person hand held assist Transfers: Sit to/from Stand Sit to Stand: Min assist, +2 physical assistance           General transfer comment: Min A +2 for lift assist and steadying. Mod A +2 to take side steps at EOB. PT had to physically move RLE to take step.      Balance Overall balance assessment: Needs assistance Sitting-balance support: No upper extremity supported, Feet supported Sitting balance-Leahy Scale: Fair     Standing balance support: Single extremity supported Standing balance-Leahy Scale: Poor Standing balance comment: Reliant on UE and external support                           ADL either performed or assessed with clinical judgement   ADL Overall ADL's : Needs assistance/impaired Eating/Feeding: NPO  Grooming: Dance movement psychotherapist;Wash/dry hands;Set up;Sitting Grooming Details (indicate cue type and reason): bed level, increased time for tasks and verbal encouragement to participate Upper Body Bathing: Minimal assistance;Bed level   Lower Body Bathing: Maximal assistance;Bed  level   Upper Body Dressing : Minimal assistance;Bed level   Lower Body Dressing: Maximal assistance;Bed level   Toilet Transfer: Moderate assistance;+2 for physical assistance;+2 for safety/equipment   Toileting- Clothing Manipulation and Hygiene: Maximal assistance;+2 for physical assistance;+2 for safety/equipment;Sit to/from stand;Bed level       Functional mobility during ADLs: Moderate assistance;+2 for physical assistance;+2 for safety/equipment (face to face) General ADL Comments: generalized weakness, frequent falls at home     Vision         Perception     Praxis      Pertinent Vitals/Pain Pain Assessment Pain Assessment: Faces Faces Pain Scale: No hurt Pain Intervention(s): Monitored during session, Repositioned     Hand Dominance Left   Extremity/Trunk Assessment Upper Extremity Assessment Upper Extremity Assessment: RUE deficits/detail RUE Deficits / Details: brace to R wrist, mild active elbow, but grossly decreased strength. weakness more distally. decreased attention to R UE. Uses L UE for function/ADL's   Lower Extremity Assessment Lower Extremity Assessment: Defer to PT evaluation RLE Deficits / Details: Foot drop and notable functional weakness. AFO at baseline   Cervical / Trunk Assessment Cervical / Trunk Assessment: Kyphotic;Other exceptions Cervical / Trunk Exceptions: recent C7   Communication Communication Communication: Expressive difficulties   Cognition Arousal/Alertness: Awake/alert Behavior During Therapy: WFL for tasks assessed/performed Overall Cognitive Status: Difficult to assess                                 General Comments: Follows simple commands, but difficult to assess secondary to communication deficits.     General Comments  Pt with recent rom change and so no HR monitor on at this time. Clinically Pt presented Todd Estrada and did not attempt to verbalize discomfort, SOB, or any other clinical signs     Exercises     Shoulder Instructions      Home Living Family/patient expects to be discharged to:: Private residence Living Arrangements: Other relatives (cousin) Available Help at Discharge: Family;Available PRN/intermittently Type of Home: House Home Access: Stairs to enter [employer] of Steps: 2   Home Layout: One level     Bathroom Shower/Tub: Teacher, early years/pre: Standard     Home Equipment: Conservation officer, nature (2 wheels);Cane - single point;BSC/3in1;Wheelchair - manual   Additional Comments: Information from prior note as pt unable to report given speech deficits.      Prior Functioning/Environment Prior Level of Function : Patient poor historian/Family not available             Mobility Comments: using WC for mobility per chart ADLs Comments: indicated cousins assist wtih ADLs        OT Problem List: Decreased strength;Decreased range of motion;Decreased activity tolerance;Impaired balance (sitting and/or standing);Decreased cognition;Impaired vision/perception;Decreased coordination;Impaired UE functional use      OT Treatment/Interventions: Self-care/ADL training;Therapeutic exercise;Energy conservation;DME and/or AE instruction;Therapeutic activities;Patient/family education;Balance training    OT Goals(Current goals can be found in the care plan section) Acute Rehab OT Goals Patient Stated Goal: none stated, unintelligble OT Goal Formulation: Patient unable to participate in goal setting Time For Goal Achievement: 09/28/22 Potential to Achieve Goals: Fair ADL Goals Pt Will Perform Grooming: with set-up;sitting Pt Will Perform Upper Body Bathing: with  supervision;sitting Pt Will Perform Lower Body Bathing: with min guard assist;sitting/lateral leans Pt Will Transfer to Toilet: with min guard assist;bedside commode Pt Will Perform Toileting - Clothing Manipulation and hygiene: with min guard assist;sitting/lateral leans Additional  ADL Goal #1: Pt will maintain seated balance EOB during ADL routine for at least 10 min at min guard level  OT Frequency: Min 2X/week    Co-evaluation PT/OT/SLP Co-Evaluation/Treatment: Yes Reason for Co-Treatment: Complexity of the patient's impairments (multi-Estrada involvement);Necessary to address cognition/behavior during functional activity;For patient/therapist safety;To address functional/ADL transfers PT goals addressed during session: Mobility/safety with mobility;Balance;Strengthening/ROM OT goals addressed during session: ADL's and self-care;Proper use of Adaptive equipment and DME;Strengthening/ROM      AM-PAC OT "6 Clicks" Daily Activity     Outcome Measure Help from another person eating meals?: A Little Help from another person taking care of personal grooming?: A Little Help from another person toileting, which includes using toliet, bedpan, or urinal?: A Lot Help from another person bathing (including washing, rinsing, drying)?: A Lot Help from another person to put on and taking off regular upper body clothing?: A Lot Help from another person to put on and taking off regular lower body clothing?: A Lot 6 Click Score: 14   End of Session Equipment Utilized During Treatment: Gait belt Nurse Communication: Mobility status;Other (comment) (no box to plug up to)  Activity Tolerance: Patient tolerated treatment well Patient left: in bed;with call bell/phone within reach (ED stretcher)  OT Visit Diagnosis: Unsteadiness on feet (R26.81);Other abnormalities of gait and mobility (R26.89);Muscle weakness (generalized) (M62.81);Cognitive communication deficit (R41.841);Low vision, both eyes (H54.2);Other symptoms and signs involving cognitive function;Hemiplegia and hemiparesis Symptoms and signs involving cognitive functions:  (prior CVA) Hemiplegia - Right/Left: Right Hemiplegia - dominant/non-dominant: Dominant Hemiplegia - caused by:  (prior CVA)                Time:  1150-1207 OT Time Calculation (min): 17 min Charges:  OT General Charges $OT Visit: 1 Visit OT Evaluation $OT Eval Low Complexity: [place] OTR/L Acute Rehabilitation Services Office: [place] 09/14/2022, 1:53 PM

## 2022-09-14 NOTE — Evaluation (Signed)
Physical Therapy Evaluation Patient Details Name: Todd Estrada MRN: [id number] DOB: 09-02-1961 Today's Date: 09/14/2022  History of Present Illness  Mr Todd Estrada is a 61 y.o. male admitted under observation 09/13/22 due to persistent cough. CT of chest pending. Of note: recent hospitalizations on 9/9-11/2022 after an unwitnessed fall at home found to have a stable non-displaced fracture of C7/chest pain/cdiff. And on on 8/25 for sepsis 2/2 necrotizing PNA. CT head reveals nondisplaced R transverse process fx of C7. PMH includes PNA, COPD, CHF, CVA with aphasia and hemiplegia, lung SCC stage IV, seizures.  Clinical Impression  Pt admitted secondary to problem above with deficits below. Pt requiring min A +2 to stand and mod A +2 to take side steps at EOB. Pt with significant aphasia at baseline, so difficult to determine baseline. Anticipate pt will have difficulty caring for himself at home. Recommending SNF level therapies at d/c to address deficits. Ultimately may benefit from LTC setting. Will continue to follow acutely.      Recommendations for follow up therapy are one component of a multi-disciplinary discharge planning process, led by the attending physician.  Recommendations may be updated based on patient status, additional functional criteria and insurance authorization.  Follow Up Recommendations Skilled nursing-short term rehab (<3 hours/day) Can patient physically be transported by private vehicle: Yes    Assistance Recommended at Discharge Frequent or constant Supervision/Assistance  Patient can return home with the following  A lot of help with walking and/or transfers;A little help with bathing/dressing/bathroom;Assist for transportation;Help with stairs or ramp for entrance;Assistance with cooking/housework    Equipment Recommendations None recommended by PT  Recommendations for Other Services       Functional Status Assessment Patient has had a recent decline in their  functional status and demonstrates the ability to make significant improvements in function in a reasonable and predictable amount of time.     Precautions / Restrictions Precautions Precautions: Fall Precaution Comments: aphasia Other Brace: AFO RLE, brace RUE, no neck brace in room Restrictions Weight Bearing Restrictions: No      Mobility  Bed Mobility Overal bed mobility: Needs Assistance Bed Mobility: Supine to Sit, Sit to Supine     Supine to sit: Min assist Sit to supine: Min assist   General bed mobility comments: assist for trunk and LE    Transfers Overall transfer level: Needs assistance Equipment used: 1 person hand held assist Transfers: Sit to/from Stand Sit to Stand: Min assist, +2 physical assistance           General transfer comment: Min A +2 for lift assist and steadying. Mod A +2 to take side steps at EOB. PT had to physically move RLE to take step.    Ambulation/Gait                  Stairs            Wheelchair Mobility    Modified Rankin (Stroke Patients Only)       Balance Overall balance assessment: Needs assistance Sitting-balance support: No upper extremity supported, Feet supported Sitting balance-Leahy Scale: Fair     Standing balance support: Single extremity supported Standing balance-Leahy Scale: Poor Standing balance comment: Reliant on UE and external support                             Pertinent Vitals/Pain Pain Assessment Pain Assessment: Faces Faces Pain Scale: No hurt    Home Living Family/patient expects  to be discharged to:: Private residence Living Arrangements: Other relatives (cousin) Available Help at Discharge: Family;Available PRN/intermittently Type of Home: House Home Access: Stairs to enter   Entrance Stairs-Number of Steps: 2   Home Layout: One level Home Equipment: Conservation officer, nature (2 wheels);Cane - single point;BSC/3in1;Wheelchair - manual Additional Comments:  Information from prior note as pt unable to report given speech deficits.    Prior Function Prior Level of Function : Patient poor historian/Family not available             Mobility Comments: using WC for mobility per chart ADLs Comments: indicated cousins assist wtih ADLs     Hand Dominance        Extremity/Trunk Assessment   Upper Extremity Assessment Upper Extremity Assessment: Defer to OT evaluation    Lower Extremity Assessment Lower Extremity Assessment: Generalized weakness;RLE deficits/detail RLE Deficits / Details: Foot drop and notable functional weakness. AFO at baseline    Cervical / Trunk Assessment Cervical / Trunk Assessment: Kyphotic;Other exceptions Cervical / Trunk Exceptions: recent C7  Communication   Communication: Expressive difficulties  Cognition Arousal/Alertness: Awake/alert Behavior During Therapy: WFL for tasks assessed/performed Overall Cognitive Status: Difficult to assess                                 General Comments: Follows simple commands, but difficult to assess secondary to communication deficits.        General Comments      Exercises     Assessment/Plan    PT Assessment Patient needs continued PT services  PT Problem List Decreased strength;Decreased range of motion;Decreased activity tolerance;Decreased mobility;Decreased coordination;Decreased balance;Decreased cognition;Decreased safety awareness;Decreased knowledge of precautions       PT Treatment Interventions DME instruction;Functional mobility training;Therapeutic activities;Therapeutic exercise;Balance training;Neuromuscular re-education;Patient/family education;Cognitive remediation    PT Goals (Current goals can be found in the Care Plan section)  Acute Rehab PT Goals PT Goal Formulation: Patient unable to participate in goal setting Time For Goal Achievement: 09/12/22 Potential to Achieve Goals: Fair    Frequency Min 2X/week      Co-evaluation PT/OT/SLP Co-Evaluation/Treatment: Yes Reason for Co-Treatment: For patient/therapist safety;To address functional/ADL transfers PT goals addressed during session: Balance;Mobility/safety with mobility         AM-PAC PT "6 Clicks" Mobility  Outcome Measure Help needed turning from your back to your side while in a flat bed without using bedrails?: A Little Help needed moving from lying on your back to sitting on the side of a flat bed without using bedrails?: A Little Help needed moving to and from a bed to a chair (including a wheelchair)?: A Lot Help needed standing up from a chair using your arms (e.g., wheelchair or bedside chair)?: A Lot Help needed to walk in hospital room?: Total Help needed climbing 3-5 steps with a railing? : Total 6 Click Score: 12    End of Session Equipment Utilized During Treatment: Gait belt Activity Tolerance: Patient tolerated treatment well Patient left: in bed;with call bell/phone within reach (on stretcher in ED) Nurse Communication: Mobility status PT Visit Diagnosis: Other abnormalities of gait and mobility (R26.89);Muscle weakness (generalized) (M62.81);Difficulty in walking, not elsewhere classified (R26.2)    Time: 6144-3154 PT Time Calculation (min) (ACUTE ONLY): 17 min   Charges:   PT Evaluation $PT Eval Moderate Complexity: 1 Mod          Reuel Derby, PT, DPT  Acute Rehabilitation Services  Office: [phone]  [place] 09/14/2022, 1:29 PM

## 2022-09-14 NOTE — ED Notes (Signed)
RN assisted pt using urinal

## 2022-09-14 NOTE — Progress Notes (Signed)
Subjective:  Patient only able to answer Yes/No questions due to baseline dysarthria, but patient reports feeling okay overall. Patient states he is not short of breath and that cough is better. He pointed to his lower abdomen to let us know that he needed to be changed due to urinating on his clothes.   Objective:  Vital signs in last 24 hours: Vitals:   09/14/22 1130 09/14/22 1226 09/14/22 1253 09/14/22 1345  BP: 100/64 96/68 95/67  110/71  Pulse: 78 83 80 80  Resp: 12 18 14 18   Temp:  98.1 F (36.7 C)    TempSrc:  Oral    SpO2: 98% 100% 97% (!) 78%   Weight change:   Intake/Output Summary (Last 24 hours) at 09/14/2022 1559 Last data filed at 09/14/2022 1428 Gross per 24 hour  Intake 200.13 ml  Output 775 ml  Net -574.87 ml   General: ill-appearing, lying in bed in no acute distress Head: normocephalic, atraumatic Cardiovascular: regular rate and rhythm, no murmurs rubs or gallops, no LEE, 2+ dorsalis pedis pulses bilaterally Pulmonary: normal work of breathing on room air. Decreased breath sounds throughout all lung field bilaterally. MSK: Increased anterior-posterior diameter of chest. Tenderness to palpation of right lower ribcage. Patient wearing wrist brace  Skin: warm and dry, well-perfused Neurological: alert & oriented x 3     Assessment/Plan:  1. CAP Patient presented early this morning with persistent cough, febrile at 100.53F, tachycardia, leukocytosis, and chest X-ray findings with progression of left perihilar density suggestive of pneumonia vs. mass lesion. Lactic acid normal. Since this morning, patient's vitals have all improved, no longer meeting criteria for sepsis/SIRS, and have remained stable. Patient has extensive multifactorial parenchymal lung pathology with nidus for recurrent pneumonia. He was hospitalized for PNA on 8/25 so it is possible that this new perihilar density could be post obstructive PNA in setting of left mainstem bronchial wall  thickening thought to be neoplastic infiltration on CT imaging on 8/25. Repeat CT this morning showed "continued opacity is anteriorly and medially in the left upper lobe which appears to be more solid compared to prior exam, and most likely represents cavitary pneumonia. There is also noted 26 x 22 mm spiculated density in the lingular segment of left upper lobe highly concerning for malignancy. Grossly stable 25 x 14 mm mixed solid and cystic abnormality with spiculated margins is noted in lateral portion of right upper lobe also concerning for malignancy. Mild coronary artery calcifications are noted." Patient was started on antibiotics to cover atypical organisms and pseudomonas. - Continue LR 139ml/hr  -Started on azithromycin and IV cefepime -Start guaifenesin PRN -Blood cultures ordered -Legionella urinary Ag ordered -Strep PNA urinary antigen ordered -Monitor WBC and vitals   2. COPD Patient is saturating well on RA. No increased sputum production or increased oxygen requirement. Low concern for exacerbation at this time -Continue albuterol duonebs -Continue Brovana and Incruse ellipta   3. Chronic hypotension Blood pressures have remained stable. -Continue midodrine 2.5mg  TID -Continue to monitor   4. HFrEF Most recent echo 02/2022 with 40-45% EF. Appears euvolemic -Monitor I&Os with daily weights   5. Stage IV SCC S/p radiation on 08/2022, scheduled for radiation treatment in 2 weeks -Continue to monitor   6. CVA with residual hemiparesis and dysarthria Seizures Dysarthria and dysphagia at baseline. Patient did have onset seizures after CVA, stable on Keppra.  -Dysphagia 1 diet -Continue on Plavix 75 mg daily -Atorvastatin 40 mg daily -Continue on Keppra   7. Hx  of mechanical fall non-displaced C7 transverse process fracture R sided rib pain -Pt consulted and recommends SNF or LTC -Fall precautions -Delirium precautions -Lidocaine patch for rib pain -Acetaminophen  650mg  q6H PRN   Anemia Chronic normocytic anemia, stable at 10.1.     LOS: 1 day  Todd Estrada, Medical Student 09/14/2022, 3:59 PM   Overall complex patient. Pt has history of CVA, so at baseline it seems he has dysarthia, and history is difficult to obtain as he is only able to respond to yes or no questions. CT was done this AM and revealed a lesion in the LUL concerning for malignancy, and another lesion consistent with pneumonia. Will continue antibiotic treatment, and reach out to radiation-oncologist to discuss findings tomorrow.   Agree with medical student's assessment and plan.   -Dr. Marlene Lard Nikkole Placzek PGY-1 Internal Medicine Resident  [phone]

## 2022-09-14 NOTE — ED Notes (Signed)
RN verified pt lunch tray ordered

## 2022-09-14 NOTE — H&P (Addendum)
Date: 09/14/2022               Patient Name:  Todd Estrada MRN: [id number]  DOB: December 14, 1961 Age / Sex: 61 y.o., male   PCP: [name], MD         Medical Service: Internal Medicine Teaching Service         Attending Physician: : Dr. 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 The rest of the history was conducted via chart review.   Today patient presents with cough and shortness of breath. He endorses pain with cough, pointing to his ribs. Patient denies increased sputum production, chills, nightsweats, chest pain, or lightheadedness. He denies abdominal pain, diarrhea, dysuria, or suprapubic tenderness.  Patient was recently discharged from IMTS service on 9/9-9/12 after an unwitnessed fall, found to have a stable non-displaced fracture of C7, evaluated by neurosurgery with no further work up. During that hospitalization patient was treated for C diff colitis with oral vancomycin and instructed to finish course at home. Patient was seen for follow up at [facility] with resolution of C diff and stable from fracture standpoint.  Per ED notes, cousin reports patient with cough for the last week that  has been persistent for the past two weeks. No fever or sputum production at home per cousin. No sick  contacts. Pain in ribs since the fall, intermittently hurting when he coughs. Patient has been able to eat and drink okay, no diarrhea since finishing the course of antibiotics.  ED Course: WBC 14.1.  Lactic acid 1.0. Flat troponins. CXR with progression of L perihilar density possible PNA  vs mass. Patient was started on ceftriaxone and azithromycin.   Meds:  No outpatient medications have been marked as taking for the 09/13/22 encounter [facility] Encounter).    Past Medical History  Past Surgical History:  Procedure Laterality Date   BRONCHIAL BIOPSY  05/30/2022   Procedure: BRONCHIAL BIOPSIES;  Surgeon: Collene Gobble, MD;  Location: [facility] ENDOSCOPY;  Service: Pulmonary;;   BRONCHIAL BRUSHINGS  05/30/2022   Procedure: BRONCHIAL BRUSHINGS;  Surgeon: Collene Gobble, MD;  Location: [facility] ENDOSCOPY;  Service: Pulmonary;;   BRONCHIAL NEEDLE ASPIRATION BIOPSY  05/30/2022   Procedure: BRONCHIAL NEEDLE ASPIRATION BIOPSIES;  Surgeon: Collene Gobble, MD;  Location: [place];  Service: Pulmonary;;   BRONCHIAL WASHINGS  05/30/2022   Procedure: BRONCHIAL WASHINGS;  Surgeon: Collene Gobble, MD;  Location: [facility] ENDOSCOPY;  Service: Pulmonary;;   ESOPHAGOGASTRODUODENOSCOPY (EGD) WITH PROPOFOL N/A 03/11/2022   Procedure: ESOPHAGOGASTRODUODENOSCOPY (EGD) WITH PROPOFOL;  Surgeon: Georganna Skeans, MD;  Location: [facility] ENDOSCOPY;  Service: General;  Laterality: N/A;   FIDUCIAL MARKER PLACEMENT  05/30/2022   Procedure: FIDUCIAL MARKER PLACEMENT;  Surgeon: Collene Gobble,  MD;  Location: [place];  Service: Pulmonary;;   IR ANGIO INTRA EXTRACRAN SEL COM CAROTID INNOMINATE UNI R MOD SED  03/02/2022   IR CT HEAD LTD  03/02/2022   IR PERCUTANEOUS ART THROMBECTOMY/INFUSION INTRACRANIAL INC DIAG ANGIO  03/02/2022   PEG PLACEMENT N/A 03/11/2022   Procedure: PERCUTANEOUS ENDOSCOPIC GASTROSTOMY (PEG) PLACEMENT;  Surgeon: Georganna Skeans, MD;  Location: [place];  Service: General;  Laterality: N/A;   RADIOLOGY WITH  ANESTHESIA N/A 03/02/2022   Procedure: IR WITH ANESTHESIA;  Surgeon: Luanne Bras, MD;  Location: [place];  Service: Radiology;  Laterality: N/A;   VIDEO BRONCHOSCOPY WITH RADIAL ENDOBRONCHIAL ULTRASOUND  05/30/2022   Procedure: VIDEO BRONCHOSCOPY WITH RADIAL ENDOBRONCHIAL ULTRASOUND;  Surgeon: Collene Gobble, MD;  Location: [facility] ENDOSCOPY;  Service: Pulmonary;;    Social:  Lives With: friends and in cared by his cousin, Todd Estrada,  in [place] to obtain Support: His cousin, Todd Estrada, is his main source of support Level of Function: patient is dependent on cousin and friends with ADLs and iADLs. Patient is continent and uses bed-side conmode with assistance PCP: [name], MD Substances: Smoker. Unable to collect more information at this time  Family History:   Throat cancer Mother     Liver cancer Father     Kidney failure Sister     Cancer - Lung Paternal Uncle     Allergies: Allergies as of 09/13/2022   (No Known Allergies)    Review of Systems: A complete ROS was negative except as per HPI.   OBJECTIVE:   Physical Exam: Blood pressure 126/79, pulse 85, temperature 99 F (37.2 C), temperature source Oral, resp. rate 16, SpO2 96 %.  Constitutional: ill-appearing laying bed, in no acute distress HENT: normocephalic atraumatic, mucous membranes moist Cardiovascular: regular rate and rhythm, no m/r/g, No JVD, 2+ radial and PD pulses symmetrical and bilateral Pulmonary/Chest: normal work of breathing on room air, difficult to auscultate but no obvious crackles on exam. Decreased breath sounds throughout. No wheezing. Abdominal: soft, non-tender, non-distended. No fluid wave Neurological: alert & oriented x 3 MSK: no gross abnormalities. Mild discomfort on palpation of the ribs on the R. Patient wearing wrist brace without pain in R wrist. No pitting edema Skin: warm and dry Psych: Normal mood and affect  Labs: CBC    Component Value Date/Time   WBC 14.1  (H) 09/13/2022 1733   RBC 3.22 (L) 09/13/2022 1733   HGB 10.1 (L) 09/13/2022 1733   HGB 10.4 (L) 09/09/2022 0908   HCT 31.6 (L) 09/13/2022 1733   HCT 31.5 (L) 09/09/2022 0908   PLT 363 09/13/2022 1733   PLT 428 09/09/2022 0908   MCV 98.1 09/13/2022 1733   MCV 92 09/09/2022 0908   MCH 31.4 09/13/2022 1733   MCHC 32.0 09/13/2022 1733   RDW 16.5 (H) 09/13/2022 1733   RDW 14.7 09/09/2022 0908   LYMPHSABS 0.8 09/13/2022 1733   LYMPHSABS 0.6 (L) 09/09/2022 0908   MONOABS 1.0 09/13/2022 1733   EOSABS 0.0 09/13/2022 1733   EOSABS 0.1 09/09/2022 0908   BASOSABS 0.1 09/13/2022 1733   BASOSABS 0.1 09/09/2022 0908     CMP     Component Value Date/Time   NA 136 09/13/2022 1733   NA 140 09/09/2022 0908   K 4.5 09/13/2022 1733   CL 99 09/13/2022 1733   CO2 26 09/13/2022 1733   GLUCOSE 122 (H) 09/13/2022 1733   BUN 16 09/13/2022 1733   BUN 16 09/09/2022 0908   CREATININE 0.68  09/13/2022 1733   CREATININE 0.78 08/08/2022 1028   CALCIUM 9.3 09/13/2022 1733   PROT 7.2 09/13/2022 1733   ALBUMIN 3.2 (L) 09/13/2022 1733   AST 13 (L) 09/13/2022 1733   AST 15 08/08/2022 1028   ALT 13 09/13/2022 1733   ALT 15 08/08/2022 1028   ALKPHOS 91 09/13/2022 1733   BILITOT 0.5 09/13/2022 1733   BILITOT 1.3 (H) 08/08/2022 1028   GFRNONAA >60 09/13/2022 1733   GFRNONAA >60 08/08/2022 1028    Imaging: DG Chest 2 View  Result Date: 09/13/2022 CLINICAL DATA:  Cough and short of breath.  Recent pneumonia. EXAM: CHEST - 2 VIEW COMPARISON:  Chest 08/27/2022.  Chest CT 08/12/2022 FINDINGS: COPD with hyperinflation of the lungs. Ill-defined nodule right upper lobe with associated marker clip unchanged. Left perihilar airspace disease has progressed. Possible pneumonia or mass. No pleural effusion.  Negative for heart failure. IMPRESSION: Progression of left perihilar density which may be pneumonia or mass lesion. Continued close follow-up warranted Right upper lobe nodule unchanged COPD Electronically Signed    By: Franchot Gallo M.D.   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 Normal lactic acid. He is a currently stable and mentation at baseline per collateral information. Patient has extensive multifactorial parenchymal lung pathology with nidus for recurrent pneumonia. Most recent hospitalization for PNA was 8/25. It is possible that this new perihilar density could be post obstructive pna in setting of L mainstem bronchial wall thickening thought to be neoplastic infiltration on CT imaging on 8/25. Patient meets SIRS and sepsis criteria; He has tested negative for MRSA within the last month . Will start patient with coverage for atypical organisms and pseudomonas coverage given recent hospitalizations and expand infectious workup to further narrow antimicrobial therapy. -100cc/hr for 10 hrs of maintenance LR -Started on azithromycin and IV cefepime -Start mucinex PRN -Blood cultures x2 -Legionella urinary Ag -Strep PNA urinary antigen -Monitor WBC  COPD Known moderate centrilobular emphysema as seen on CT,  and tobacco history. Patient is saturating 96% on RA. No increased sputum production or  increased oxygen requirement. Low concern for exacerbation at this time -Continue albuterol duonebs -Continue on Brovana and Incruse ellipta  Chronic hypotension (though hx of HTN per chart) Usual SBP 90-100 suspected in the setting of deconditioning on midodrine 2.5mg  TID -Continue on midodrine 2.5mg  TID -Continue to monitor BP, especially in setting of acute infection  HFrEF Most recent echo 02/2022 with 40-45% EF. Repeat ECHO on 08/2022 Signs of exacerbation on exam or imaging.  -Strict I&Os -Daily weights  Stage IV SCC S/p radiation on 08/2022 -Continue to monitor  CVA with residual hemiparesis and dysarthria Seizures Stable with dysarthria and dyaphagia. Patient with new onset seizures after CVA, stable on  Keppra.  Evaluated at previous hospitalizations by SLP. Will continue on dysphagia 1 diet -Dysphagia 1 diet -Continue on Plavix 75 mg daily -Atorvastatin 40 mg daily -Continue on Keppra  Hx of mechanical fall non-displaced C7 transverse process fracture R sided rib pain -Stable -Fall precautions -Delirium precautions -Consider PT consult during this admission to prevent further deconditioning -Lidocaine patch  -Acetaminophen 650mg  q6H PRN  Anemia Chronic normocytic anemia, stable at 10.1. -CTM  Diet:  Dysphagia 1 diet VTE: Enoxaparin Code: DNR  Prior to Admission Living Arrangement: Home, living cousin Anticipated Discharge Location: SNF Barriers to Discharge: clinical improvement and PT evaluation to assess placement  Dispo: Admit patient to Observation with expected length of stay less than 2 midnights.  Signed:   Romana Juniper, MD Internal Medicine Resident PGY-1 09/14/2022, 6:07 AM

## 2022-09-14 NOTE — ED Notes (Signed)
RN changed pt into hospital gown, slip resistant socks, and placed belonging in pt bag.

## 2022-09-14 NOTE — ED Provider Notes (Signed)
[facility] EMERGENCY DEPARTMENT Provider Note   CSN: [id number] Arrival date & time: 09/13/22  1619     History  Chief Complaint  Patient presents with   Cough   Level 5 caveat due to altered mental status/aphasia Todd Estrada is a 61 y.o. male.  The history is provided by the patient. The history is limited by the condition of the patient.  Cough Patient presents with cough and shortness of breath.  Patient was recently admitted for a fall and apparently had increasing cough and shortness of breath over the past week Patient has history of squamous cell lung CA as well as previous stroke with right-sided deficits and aphasia.     Home Medications Prior to Admission medications   Medication Sig Start Date End Date Taking? Authorizing Provider  albuterol (PROVENTIL) (2.5 MG/3ML) 0.083% nebulizer solution Take 3 mLs (2.5 mg total) by nebulization every 4 (four) hours as needed for wheezing or shortness of breath. 08/16/22   Lacinda Axon, MD  atorvastatin (LIPITOR) 40 MG tablet Take 1 tablet (40 mg total) by mouth daily. 05/25/22 05/25/23  Lacinda Axon, MD  budesonide (PULMICORT) 0.5 MG/2ML nebulizer solution USE 2 ML(0.5 MG) VIA NEBULIZER TWICE DAILY Patient taking differently: Take 0.5 mg by nebulization 2 (two) times daily. 06/27/22   Freddi Starr, MD  clopidogrel (PLAVIX) 75 MG tablet Take 1 tablet (75 mg total) by mouth daily. Okay to restart this medication on 05/31/2022 Patient taking differently: Take 75 mg by mouth daily. 06/28/22   Gaylan Gerold, DO  diclofenac Sodium (VOLTAREN) 1 % GEL Apply 2 g topically 4 (four) times daily. 08/30/22   Leigh Aurora, DO  folic acid (FOLVITE) 1 MG tablet Take 1 tablet (1 mg total) by mouth daily. 05/09/22   Maudie Mercury, MD  ketotifen (ALLERGY EYE DROPS) 0.025 % ophthalmic solution Place 1 drop into the right eye 2 (two) times daily. 08/30/22   Leigh Aurora, DO  levETIRAcetam (KEPPRA) 500 MG tablet Take 1  tablet (500 mg total) by mouth 2 (two) times daily. 06/02/22   Marcial Pacas, MD  lidocaine (LIDODERM) 5 % Place 1 patch onto the skin daily. Remove & Discard patch within 12 hours or as directed by MD 08/31/22   Leigh Aurora, DO  midodrine (PROAMATINE) 2.5 MG tablet Take 1 tablet (2.5 mg total) by mouth 3 (three) times daily with meals. 08/30/22 02/26/23  Leigh Aurora, DO  Multiple Vitamin (MULTIVITAMIN WITH MINERALS) TABS tablet Take 1 tablet by mouth daily. 05/19/22   Maudie Mercury, MD  nicotine (NICODERM CQ - DOSED IN MG/24 HOURS) 14 mg/24hr patch Place 1 patch (14 mg total) onto the skin daily. 07/22/22   Atway, Rayann N, DO  ondansetron (ZOFRAN) 4 MG tablet Take 4 mg by mouth every 8 (eight) hours as needed for nausea or vomiting.    [provider]  STIOLTO RESPIMAT 2.5-2.5 MCG/ACT AERS INHALE 2 PUFFS INTO THE LUNGS DAILY 06/17/22   Freddi Starr, MD  vancomycin (VANCOCIN) 125 MG capsule Take 1 capsule (125 mg total) by mouth 4 (four) times daily. 08/30/22   Leigh Aurora, DO      Allergies    Patient has no known allergies.    Review of Systems   Review of Systems  Unable to perform ROS: Mental status change  Respiratory:  Positive for cough.     Physical Exam Updated Vital Signs BP 126/79 (BP Location: Left Arm)   Pulse 85   Temp 99  F (37.2 C) (Oral)   Resp 16   SpO2 96%  Physical Exam CONSTITUTIONAL chronically ill-appearing HEAD: Normocephalic/atraumatic EYES: EOMI/PERRL ENMT: Mucous membranes moist NECK: supple no meningeal signs SPINE/BACK:entire spine nontender CV: S1/S2 noted, no murmurs/rubs/gallops noted LUNGS: Coarse breath sounds, no acute distress ABDOMEN: soft NEURO: Pt is awake and alert.  Patient with significant expressive aphasia.  Only able to answer yes and no questions EXTREMITIES: pulses normal/equal, no obvious deformities SKIN: warm, color normal  ED Results / Procedures / Treatments   Labs (all labs ordered are listed, but only abnormal  results are displayed) Labs Reviewed  CBC WITH DIFFERENTIAL/PLATELET - Abnormal; Notable for the following components:      Result Value   WBC 14.1 (*)    RBC 3.22 (*)    Hemoglobin 10.1 (*)    HCT 31.6 (*)    RDW 16.5 (*)    Neutro Abs 12.1 (*)    All other components within normal limits  COMPREHENSIVE METABOLIC PANEL - Abnormal; Notable for the following components:   Glucose, Bld 122 (*)    Albumin 3.2 (*)    AST 13 (*)    All other components within normal limits  SARS CORONAVIRUS 2 BY RT PCR  LACTIC ACID, PLASMA  LACTIC ACID, PLASMA  MAGNESIUM  TROPONIN I (HIGH SENSITIVITY)  TROPONIN I (HIGH SENSITIVITY)    EKG EKG Interpretation  Date/Time:  Tuesday September 13 2022 23:36:37 EDT Ventricular Rate:  94 PR Interval:  140 QRS Duration: 84 QT Interval:  346 QTC Calculation: 432 R Axis:   83 Text Interpretation: Normal sinus rhythm Normal ECG When compared with ECG of 27-Aug-2022 03:33, No significant change was found Confirmed by Delora Fuel ([id number]) on 09/14/2022 12:11:26 AM  Radiology DG Chest 2 View  Result Date: 09/13/2022 CLINICAL DATA:  Cough and short of breath.  Recent pneumonia. EXAM: CHEST - 2 VIEW COMPARISON:  Chest 08/27/2022.  Chest CT 08/12/2022 FINDINGS: COPD with hyperinflation of the lungs. Ill-defined nodule right upper lobe with associated marker clip unchanged. Left perihilar airspace disease has progressed. Possible pneumonia or mass. No pleural effusion.  Negative for heart failure. IMPRESSION: Progression of left perihilar density which may be pneumonia or mass lesion. Continued close follow-up warranted Right upper lobe nodule unchanged COPD Electronically Signed   By: Franchot Gallo M.D.   On: 09/13/2022 18:19    Procedures Procedures    Medications Ordered in ED Medications  cefTRIAXone (ROCEPHIN) 1 g in sodium chloride 0.9 % 100 mL IVPB (has no administration in time range)  azithromycin (ZITHROMAX) 500 mg in sodium chloride 0.9 % 250 mL  IVPB (has no administration in time range)    ED Course/ Medical Decision Making/ A&P Clinical Course as of 09/14/22 0450  Wed Sep 14, 2022  0426 Glucose(!): 122 Hyperglycemia [DW]  0426 WBC(!): 14.1 Leukocytosis [DW]  0434 Patient with history of lung CA, previous CVA with known aphasia and right-sided weakness apparently had recent cough and shortness of breath.  Patient febrile and noted to have pneumonia on x-ray.  He is not septic appearing.  Patient recently admitted for unwitnessed fall as well as C. difficile colitis [DW]  [phone] Discussed with internal medicine residency who will admit.  They will likely broaden his antibiotics.  Patient would benefit from placement in a skilled nursing facility [DW]    Clinical Course User Index [DW] Ripley Fraise, MD  Medical Decision Making Amount and/or Complexity of Data Reviewed Labs:  Decision-making details documented in ED Course.  Risk Decision regarding hospitalization.   This patient presents to the ED for concern of shortness of breath, this involves an extensive number of treatment options, and is a complaint that carries with it a high risk of complications and morbidity.  The differential diagnosis includes but is not limited to Acute coronary syndrome, pneumonia, acute pulmonary edema, pneumothorax, acute anemia, pulmonary embolism    Comorbidities that complicate the patient evaluation: Patient's presentation is complicated by their history of CVA, cancer  Social Determinants of Health: Patient's  tobacco use   increases the complexity of managing their presentation  Additional history obtained: Records reviewed previous admission documents  Lab Tests: I Ordered, and personally interpreted labs.  The pertinent results include: Leukocytosis  Imaging Studies ordered: I ordered imaging studies including X-ray chest   I independently visualized and interpreted imaging which showed pneumonia I  agree with the radiologist interpretation  Cardiac Monitoring: The patient was maintained on a cardiac monitor.  I personally viewed and interpreted the cardiac monitor which showed an underlying rhythm of:  sinus rhythm  Medicines ordered and prescription drug management: I ordered medication including IV antibiotics for pneumonia   Critical Interventions:  IV antibiotics  Consultations Obtained: I requested consultation with the admitting physician internal medicine resident , and discussed  findings as well as pertinent plan - they recommend: Admit   Complexity of problems addressed: Patient's presentation is most consistent with  acute presentation with potential threat to life or bodily function  Disposition: After consideration of the diagnostic results and the patient's response to treatment,  I feel that the patent would benefit from admission   .           Final Clinical Impression(s) / ED Diagnoses Final diagnoses:  Community acquired pneumonia, unspecified laterality    Rx / DC Orders ED Discharge Orders     None         Ripley Fraise, MD 09/14/22 [phone]

## 2022-09-15 ENCOUNTER — Observation Stay (HOSPITAL_COMMUNITY): Payer: Medicaid Other

## 2022-09-15 DIAGNOSIS — Z87891 Personal history of nicotine dependence: Secondary | ICD-10-CM | POA: Diagnosis not present

## 2022-09-15 DIAGNOSIS — J189 Pneumonia, unspecified organism: Secondary | ICD-10-CM | POA: Diagnosis not present

## 2022-09-15 LAB — BASIC METABOLIC PANEL
Anion gap: 14 (ref 5–15)
BUN: 13 mg/dL (ref 8–23)
CO2: 26 mmol/L (ref 22–32)
Calcium: 9.5 mg/dL (ref 8.9–10.3)
Chloride: 97 mmol/L — ABNORMAL LOW (ref 98–111)
Creatinine, Ser: 0.71 mg/dL (ref 0.61–1.24)
GFR, Estimated: >60 mL/min (ref >60)
Glucose, Bld: 124 mg/dL — ABNORMAL HIGH (ref 70–99)
Potassium: 4.1 mmol/L (ref 3.5–5.1)
Sodium: 137 mmol/L (ref 135–145)

## 2022-09-15 LAB — CBC
HCT: 28 % — ABNORMAL LOW (ref 39.0–52.0)
Hemoglobin: 9.1 g/dL — ABNORMAL LOW (ref 13.0–17.0)
MCH: 31.3 pg (ref 26.0–34.0)
MCHC: 32.5 g/dL (ref 30.0–36.0)
MCV: 96.2 fL (ref 80.0–100.0)
Platelets: 289 10*3/uL (ref 150–400)
RBC: 2.91 MIL/uL — ABNORMAL LOW (ref 4.22–5.81)
RDW: 16 % — ABNORMAL HIGH (ref 11.5–15.5)
WBC: 10.5 10*3/uL (ref 4.0–10.5)
nRBC: 0 % (ref 0.0–0.2)

## 2022-09-15 LAB — LEGIONELLA PNEUMOPHILA SEROGP 1 UR AG: L. pneumophila Serogp 1 Ur Ag: NEGATIVE

## 2022-09-15 MED ORDER — SODIUM CHLORIDE 0.9 % IV SOLN: INTRAVENOUS | Status: DC | PRN

## 2022-09-15 MED ORDER — VANCOMYCIN HCL 1500 MG/300ML IV SOLN
1500.0000 mg | Freq: Two times a day (BID) | INTRAVENOUS | Status: DC
  Administered 2022-09-15: 1500 mg via INTRAVENOUS
  Filled 2022-09-15 (×2): qty 300

## 2022-09-15 MED ORDER — VANCOMYCIN HCL 1500 MG/300ML IV SOLN
1500.0000 mg | Freq: Once | INTRAVENOUS | Status: AC
  Administered 2022-09-15: 1500 mg via INTRAVENOUS
  Filled 2022-09-15 (×2): qty 300

## 2022-09-15 MED ORDER — GADOPICLENOL 0.5 MMOL/ML IV SOLN
7.3000 mL | Freq: Once | INTRAVENOUS | Status: AC | PRN
  Administered 2022-09-16: 7.3 mL via INTRAVENOUS

## 2022-09-15 NOTE — Progress Notes (Signed)
Subjective:  Patient only able to answer Yes/No questions due to baseline dysarthria, but patient reports feeling better today and states his cough, shortness of breath, and rib pain have all improved. When asked whether he felt okay being discharged he shook his head no and pointed to the right side of his head. Unclear exactly what he was trying to communicate, but it seems he has a new-onset HA rated 7/10 on the pain scale.  Objective:  Vital signs in last 24 hours: Vitals:   09/15/22 0829 09/15/22 0836 09/15/22 1026 09/15/22 1117  BP:  101/72 107/62 100/72  Pulse:  90  91  Resp:  20  20  Temp:  98.8 F (37.1 C)  98.6 F (37 C)  TempSrc:  Oral  Oral  SpO2: 93% 95%  98%   Weight change:   Intake/Output Summary (Last 24 hours) at 09/15/2022 1421 Last data filed at 09/15/2022 1032 Gross per 24 hour  Intake 1919.65 ml  Output 1200 ml  Net 719.65 ml   General: ill-appearing but improved, lying in bed in no acute distress Head: normocephalic, atraumatic, mild erythema surrounding right eye, mild ptosis of right eye Cardiovascular: regular rate and rhythm, no murmurs rubs or gallops, no LEE Pulmonary: normal work of breathing on room air. Decreased breath sounds throughout all lung field bilaterally. MSK: Increased anterior-posterior diameter of chest. 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  1. CAP Antibiotic day 5 of 7. Patient's vitals have all improved and he no longer has leukocytosis. Blood cultures and Strep.  pneumoniae urinary antigen both negative. Patient well enough to be discharged but given new-onset HA (plan below) we will continue on IV abx. -Continue azithromycin and IV cefepime day 5/7 -Legionella urinary Ag ordered and pending -Guaifenesin PRN -Monitor WBC and vitals   2. Right-sided Headache Patient reports new-onset right-sided headache as of today. Patient has stage IV SCC of lung so MRI brain w/ and w/o contrast ordered to rule out metastasis vs. intracranial abnormality that might be contributing. RT stated patient was unable to adequately answer screening questions to get MRI. KUB ordered which found no metallic foreign body to preclude MRI scanning in addition to no acute findings. -Proceed with MRI  -Tylenol PRN  3. Centrilobular emphysema Patient continuing to saturate well on RA. No increased sputum production or increased oxygen requirement. -Continue Brovana and Incruse ellipta --Albuterol PRN  4. Chronic hypotension Blood pressures have remained stable. -Continue midodrine 2.5mg  TID -Continue to monitor  5. HFrEF Most recent echo 02/2022 with 40-45% EF. Appears euvolemic -Patient -260 ml today -Continue to monitor I/O's -GDMT limited by hypotension -Consider SGLT2 inhibitor   6. Stage IV SCC S/p radiation on 08/2022, scheduled for radiation treatment in 2 weeks -Continue to monitor   7. CVA with residual hemiparesis and dysarthria Seizures Dysarthria and dysphagia at baseline. Patient did have onset seizures after CVA, stable on Keppra.  -Dysphagia 1 diet -Continue on Plavix 75 mg daily -Atorvastatin 40 mg daily -Continue on Keppra   8. Hx of mechanical fall  non-displaced C7 transverse process fracture R sided rib pain -Pt consulted and recommends SNF -Fall precautions -Delirium precautions -Lidocaine patch for rib pain -Acetaminophen 650mg  q6H PRN   9. Chronic Normocytic Anemia Hgb stable, no evidence for acute bleed. Likely secondary to chronic  disease states -Follow-up with colon cancer screening as outpatient  Code: DNR Diet: Dysphagia DVT Prophylaxis: Lovenox   LOS: 2 days  Stanford Breed, Medical Student 09/15/2022, 2:21 PM

## 2022-09-15 NOTE — Progress Notes (Signed)
Pharmacy Antibiotic Note  ABRIAN HANOVER is a 61 y.o. male admitted on 09/13/2022 with pneumonia.  Pharmacy has been consulted for vancomycin dosing.  Plan: Vancomycin 1500mg  IV q12 hours (eAUC 518) Monitor renal function and order levels as appropriate      Temp (24hrs), Avg:98.2 F (36.8 C), Min:98 F (36.7 C), Max:98.4 F (36.9 C)  Recent Labs  Lab 09/09/22 0908 09/13/22 1733 09/13/22 2341  WBC 11.5* 14.1*  --   CREATININE 0.63* 0.68  --   LATICACIDVEN  --  1.4 1.0    Estimated Creatinine Clearance: 98.3 mL/min (by C-G formula based on SCr of 0.68 mg/dL).    No Known Allergies  Antimicrobials this admission: Cefepime 9/27>> Azithromycin 9/27>> Vancomcyin 9/28>>  Microbiology results: pending  Thank you for allowing pharmacy to be a part of this patient's care.  Jodean Lima Jaidynn Balster 09/15/2022 6:04 AM

## 2022-09-15 NOTE — Telephone Encounter (Signed)
Ok. Thank you.

## 2022-09-16 ENCOUNTER — Other Ambulatory Visit (HOSPITAL_COMMUNITY): Payer: Self-pay

## 2022-09-16 DIAGNOSIS — D638 Anemia in other chronic diseases classified elsewhere: Secondary | ICD-10-CM | POA: Diagnosis present

## 2022-09-16 DIAGNOSIS — Z66 Do not resuscitate: Secondary | ICD-10-CM | POA: Diagnosis present

## 2022-09-16 DIAGNOSIS — J189 Pneumonia, unspecified organism: Secondary | ICD-10-CM | POA: Diagnosis not present

## 2022-09-16 DIAGNOSIS — I69322 Dysarthria following cerebral infarction: Secondary | ICD-10-CM | POA: Diagnosis not present

## 2022-09-16 DIAGNOSIS — Y95 Nosocomial condition: Secondary | ICD-10-CM | POA: Diagnosis present

## 2022-09-16 DIAGNOSIS — C3412 Malignant neoplasm of upper lobe, left bronchus or lung: Secondary | ICD-10-CM | POA: Diagnosis present

## 2022-09-16 DIAGNOSIS — Z79899 Other long term (current) drug therapy: Secondary | ICD-10-CM | POA: Diagnosis not present

## 2022-09-16 DIAGNOSIS — I9589 Other hypotension: Secondary | ICD-10-CM | POA: Diagnosis present

## 2022-09-16 DIAGNOSIS — Z923 Personal history of irradiation: Secondary | ICD-10-CM | POA: Diagnosis not present

## 2022-09-16 DIAGNOSIS — Z808 Family history of malignant neoplasm of other organs or systems: Secondary | ICD-10-CM | POA: Diagnosis not present

## 2022-09-16 DIAGNOSIS — Z87891 Personal history of nicotine dependence: Secondary | ICD-10-CM | POA: Diagnosis not present

## 2022-09-16 DIAGNOSIS — J432 Centrilobular emphysema: Secondary | ICD-10-CM | POA: Diagnosis present

## 2022-09-16 DIAGNOSIS — I700 Atherosclerosis of aorta: Secondary | ICD-10-CM | POA: Diagnosis present

## 2022-09-16 DIAGNOSIS — Z7951 Long term (current) use of inhaled steroids: Secondary | ICD-10-CM | POA: Diagnosis not present

## 2022-09-16 DIAGNOSIS — I2782 Chronic pulmonary embolism: Secondary | ICD-10-CM | POA: Diagnosis present

## 2022-09-16 DIAGNOSIS — I69351 Hemiplegia and hemiparesis following cerebral infarction affecting right dominant side: Secondary | ICD-10-CM | POA: Diagnosis not present

## 2022-09-16 DIAGNOSIS — I2510 Atherosclerotic heart disease of native coronary artery without angina pectoris: Secondary | ICD-10-CM | POA: Diagnosis present

## 2022-09-16 DIAGNOSIS — Z7902 Long term (current) use of antithrombotics/antiplatelets: Secondary | ICD-10-CM | POA: Diagnosis not present

## 2022-09-16 DIAGNOSIS — I5022 Chronic systolic (congestive) heart failure: Secondary | ICD-10-CM | POA: Diagnosis present

## 2022-09-16 DIAGNOSIS — I110 Hypertensive heart disease with heart failure: Secondary | ICD-10-CM | POA: Diagnosis present

## 2022-09-16 DIAGNOSIS — Z9221 Personal history of antineoplastic chemotherapy: Secondary | ICD-10-CM | POA: Diagnosis not present

## 2022-09-16 DIAGNOSIS — Z20822 Contact with and (suspected) exposure to covid-19: Secondary | ICD-10-CM | POA: Diagnosis present

## 2022-09-16 HISTORY — DX: Pneumonia, unspecified organism: J18.9

## 2022-09-16 HISTORY — DX: Nosocomial condition: Y95

## 2022-09-16 LAB — BASIC METABOLIC PANEL
Anion gap: 7 (ref 5–15)
BUN: 15 mg/dL (ref 8–23)
CO2: 24 mmol/L (ref 22–32)
Calcium: 8.4 mg/dL — ABNORMAL LOW (ref 8.9–10.3)
Chloride: 103 mmol/L (ref 98–111)
Creatinine, Ser: 0.82 mg/dL (ref 0.61–1.24)
GFR, Estimated: >60 mL/min (ref >60)
Glucose, Bld: 104 mg/dL — ABNORMAL HIGH (ref 70–99)
Potassium: 4.5 mmol/L (ref 3.5–5.1)
Sodium: 134 mmol/L — ABNORMAL LOW (ref 135–145)

## 2022-09-16 LAB — CBC
HCT: 28.8 % — ABNORMAL LOW (ref 39.0–52.0)
Hemoglobin: 9.1 g/dL — ABNORMAL LOW (ref 13.0–17.0)
MCH: 31.1 pg (ref 26.0–34.0)
MCHC: 31.6 g/dL (ref 30.0–36.0)
MCV: 98.3 fL (ref 80.0–100.0)
Platelets: 276 10*3/uL (ref 150–400)
RBC: 2.93 MIL/uL — ABNORMAL LOW (ref 4.22–5.81)
RDW: 15.8 % — ABNORMAL HIGH (ref 11.5–15.5)
WBC: 8.9 10*3/uL (ref 4.0–10.5)
nRBC: 0 % (ref 0.0–0.2)

## 2022-09-16 MED ORDER — FLUTICASONE PROPIONATE 50 MCG/ACT NA SUSP
2.0000 | Freq: Every day | NASAL | Status: DC
  Administered 2022-09-16: 2 via NASAL
  Filled 2022-09-16 (×2): qty 16

## 2022-09-16 MED ORDER — CEFDINIR 300 MG PO CAPS
300.0000 mg | ORAL_CAPSULE | Freq: Two times a day (BID) | ORAL | Status: DC
  Filled 2022-09-16 (×2): qty 1

## 2022-09-16 MED ORDER — CEFDINIR 300 MG PO CAPS
300.0000 mg | ORAL_CAPSULE | Freq: Two times a day (BID) | ORAL | 0 refills | Status: DC
  Filled 2022-09-16: qty 9, 5d supply, fill #0

## 2022-09-16 NOTE — Discharge Instructions (Addendum)
It was a pleasure being a part of your care team! You were treated for pneumonia and have responded great to antibiotic treatment. We will be sending you home with oral antibiotics to help further clear the infection.Please follow-up with your oncologist and primary care physician. We wish you all the best!

## 2022-09-16 NOTE — Discharge Summary (Cosign Needed)
Name: Todd Estrada MRN: [id number] DOB: January 02, 1961 61 y.o. PCP: [name], MD  Date of Admission: 09/13/2022  5:12 PM Date of Discharge: No discharge date for patient encounter. Attending Physician: Velna Ochs, MD  Discharge Diagnosis: 1. 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   atorvastatin 40 MG tablet Commonly known as: Lipitor Take 1 tablet (40 mg total) by mouth daily.   budesonide 0.5 MG/2ML nebulizer solution Commonly known as: PULMICORT USE 2 ML(0.5 MG) VIA NEBULIZER TWICE DAILY What changed: See the new instructions.   cefdinir 300 MG capsule Commonly known as: OMNICEF Take 1 capsule (300 mg total) by mouth 2 (two) times daily.   clopidogrel 75 MG tablet Commonly known as: PLAVIX Take 1 tablet (75 mg total) by mouth daily. Okay to restart this medication on 05/31/2022 What changed: additional instructions   diclofenac Sodium 1 % Gel Commonly known as: Voltaren Apply 2 g topically 4 (four) times daily.   folic acid 1 MG tablet Commonly known as: FOLVITE Take 1 tablet (1 mg total) by mouth daily.   ketotifen 0.025 % ophthalmic solution Commonly known as: Allergy Eye Drops Place 1 drop into the right eye 2 (two) times daily.   levETIRAcetam 500 MG tablet Commonly known as: Keppra Take 1 tablet (500 mg total) by mouth 2 (two) times daily.   lidocaine 5  % Commonly known as: LIDODERM Place 1 patch onto the skin daily. Remove & Discard patch within 12 hours or as directed by MD   midodrine 2.5 MG tablet Commonly known as: PROAMATINE Take 1 tablet (2.5 mg total) by mouth 3 (three) times daily with meals.   multivitamin with minerals Tabs tablet Take 1 tablet by mouth daily.   nicotine 14 mg/24hr patch Commonly known as: NICODERM CQ - dosed in mg/24 hours Place 1 patch (14 mg total) onto the skin daily.   ondansetron 4 MG tablet Commonly known as: ZOFRAN Take 4 mg by mouth every 8 (eight) hours as needed for nausea or vomiting.   Stiolto Respimat 2.5-2.5 MCG/ACT Aers Generic drug: Tiotropium Bromide-Olodaterol INHALE 2 PUFFS INTO THE LUNGS DAILY        Disposition and follow-up:   Mr.Eura L Males was discharged from [facility] in Stable condition.  At the hospital follow up visit please address:  1.  PCP: Patient would benefit from colon-cancer screening.  Pulmonology/Oncology: Patient being followed by oncology, but please further clarify disease burden of SCC of lung. Also whether further anti infective treatment is necessary given cavitation found on CT chest.  2.  Labs / imaging needed at time of follow-up: N/A  3.  Pending labs/ test needing follow-up: N/A  Follow-up Appointments:   Hospital Course by problem list:  1. Hospital Acquired Pneumonia Patient presented to the ED with fever and cough. Chest X-Ray showed progression of left perihilar  density which may be pneumonia or mass lesion. A CT chest revealed continued opacity anteriorly and medially in the left upper lobe which appears to be more solid compared to prior exam, and most likely represents cavitary pneumonia.There is also noted 26 x 22 mm spiculated density in the lingular segment of left upper lobe highly concerning for malignancy. Grossly stable 25 x 14 mm mixed solid and cystic abnormality with spiculated margins is noted in lateral  portion of right upper lobe also concerning for malignancy. Mild coronary artery calcifications are noted. Aortic Atherosclerosis and Emphysema also noted. Patient treated with 3 days of antibiotics with improvement in vitals, resolution of fever, leukocytosis and cough. Patient has remained stable. Patient will be discharged with oral Cefdinir to be taken for the next 4 days to completed 7 day course of antibiotics. Pulmonology consulted and compared two most recent CT scans--noted subtle differences but overall the same findings. They did not see a need for outpatient follow-up at this time and stated oncology follow-up should be sufficient.  2. Stage IV SCC of Lung Patient being followed by oncology for his known Stage IV SCC of lung and will continue radiation treatment.   2. Centrilobular Emphysema Patient has been treated with Brovana and Incruse Ellipta as well as Albuterol PRN. He has continued to saturate well since admission with no wheezing on exam and will be discharged with his home regimen.   3. Chronic Hypotension Patient has been treated with Midodrine 2.5 TID and BP's have remained stable since admission. Patient to be discharged with home regimen.   4. HFrEF Most recent EF in March measured to be 40-45%. GDMT is limited by chronic hypotension, but patient has remained euvolemic.  5. CVA with residual hemiparesis, dysarthria, and seizures  Patient has remained stable on Keppra, Dysphagia 1 diet, Plavix, and Atorvastatin. Patient to be discharged with home regimen.  6. History of mechanical fall with non-displaced C7 transverse process fracture and right-sided rib pain. Previous team spoke with neurosurgery who stated C7 fracture is stable and does not require intervention. Patient has been on fall and delirium precautions since admission. Patient given lidocaine patch and Tylenol PRN for rib pain. PT consulted and recommends SNF, but patient does not want to go.   7. Chronic  Normocytic Anemia Hgb stable since admission and is likely secondary to chronic disease state. Patient instructed to follow-up with PCP.  8. Right-sided headache Patient endorsed new-onset right-sided headache. Given known Stage IV SCC of lung, MRI ordered to rule out metastasis. MRI revealed no acute intracranial process. No evidence of acute or subacute infarct. Persistent diffusion restriction and intrinsic T1 hyperintense signal associated with areas of laminar necrosis from the remote left frontal and parietal infarcts. No abnormal parenchymal or meningeal enhancement. No evidence of metastatic disease in the brain. Air-fluid level in the right maxillary sinus, which can be seen in the setting of acute sinusitis. Patient reports feeling better but given the findings associated with sinusitis, patient was given Flonase to further relieve symptoms. Patient will be discharged with Flonase and will follow up with PCP.  Discharge Exam:   BP 90/61 (BP Location: Left Arm)   Pulse 92   Temp 98.3 F (36.8 C) (Oral)   Resp 16   SpO2 94%  Discharge exam:  General: ill-appearing but improved, lying in bed in no acute distress Head: normocephalic, atraumatic, mild erythema surrounding right eye, mild ptosis of right eye Cardiovascular: regular rate and rhythm, no murmurs rubs or gallops, no  LEE Pulmonary: normal work of breathing on room air. Decreased breath sounds throughout all lung field bilaterally. MSK: Increased anterior-posterior diameter of chest. Patient wearing wrist brace  Skin: warm and dry, well-perfused Neurological: alert & oriented x 3  Pertinent Labs, Studies, and Procedures:     Latest Ref Rng & Units 09/16/2022    7:20 AM 09/15/2022    6:16 AM 09/13/2022    5:33 PM  CBC  WBC 4.0 - 10.5 K/uL 8.9  10.5  14.1   Hemoglobin 13.0 - 17.0 g/dL 9.1  9.1  10.1   Hematocrit 39.0 - 52.0 % 28.8  28.0  31.6   Platelets 150 - 400 K/uL 276  289  363        Latest Ref Rng & Units  09/16/2022    7:20 AM 09/15/2022    6:16 AM 09/13/2022    5:33 PM  BMP  Glucose 70 - 99 mg/dL 104  124  122   BUN 8 - 23 mg/dL 15  13  16    Creatinine 0.61 - 1.24 mg/dL 0.82  0.71  0.68   Sodium 135 - 145 mmol/L 134  137  136   Potassium 3.5 - 5.1 mmol/L 4.5  4.1  4.5   Chloride 98 - 111 mmol/L 103  97  99   CO2 22 - 32 mmol/L 24  26  26    Calcium 8.9 - 10.3 mg/dL 8.4  9.5  9.3    CT CHEST W CONTRAST  Result Date: 09/14/2022 CLINICAL DATA:  Pneumonia. EXAM: CT CHEST WITH CONTRAST TECHNIQUE: Multidetector CT imaging of the chest was performed during intravenous contrast administration. RADIATION DOSE REDUCTION: This exam was performed according to the departmental dose-optimization program which includes automated exposure control, adjustment of the mA and/or kV according to patient size and/or use of iterative reconstruction technique. CONTRAST:  34mL OMNIPAQUE IOHEXOL 350 MG/ML SOLN COMPARISON:  Radiograph of September 13, 2022. CT scan of August 12, 2022. FINDINGS: Cardiovascular: Atherosclerosis of thoracic aorta is noted without aneurysm or dissection. Normal cardiac size. No pericardial effusion. Mild coronary artery calcifications are noted. Mediastinum/Nodes: No enlarged mediastinal, hilar, or axillary lymph nodes. Thyroid gland, trachea, and esophagus demonstrate no significant findings. Lungs/Pleura: No pneumothorax or pleural effusion is noted. Emphysematous disease is again noted. Grossly stable 25 x 14 mm mix solid and cystic abnormality with spiculated margins is noted in the lateral portion of the right upper lobe highly concerning for malignancy. 26 x 22 mm spiculated density is noted in the lingular segment of left upper lobe best seen on image number 71 of series 4 highly concerning for malignancy. There appears to be continued opacity anteriorly and medially in the left upper lobe which appears to be more solid compared to prior exam, and most likely represents cavitary pneumonia. Mild  right basilar scarring is noted. Upper Abdomen: No acute abnormality. Musculoskeletal: No chest wall abnormality. No acute or significant osseous findings. IMPRESSION: Continued opacity is noted anteriorly and medially in the left upper lobe which appears to be more solid compared to prior exam, and most likely represents cavitary pneumonia. There is also noted 26 x 22 mm spiculated density in the lingular segment of left upper lobe highly concerning for malignancy. Grossly stable 25 x 14 mm mixed solid and cystic abnormality with spiculated margins is noted in lateral portion of right upper lobe also concerning for malignancy. Mild coronary artery calcifications are noted. Aortic Atherosclerosis (ICD10-I70.0) and Emphysema (ICD10-J43.9). Electronically Signed   By: [name]  Murlean Caller M.D.   On: 09/14/2022 09:05   DG Chest 2 View  Result Date: 09/13/2022 CLINICAL DATA:  Cough and short of breath.  Recent pneumonia. EXAM: CHEST - 2 VIEW COMPARISON:  Chest 08/27/2022.  Chest CT 08/12/2022 FINDINGS: COPD with hyperinflation of the lungs. Ill-defined nodule right upper lobe with associated marker clip unchanged. Left perihilar airspace disease has progressed. Possible pneumonia or mass. No pleural effusion.  Negative for heart failure. IMPRESSION: Progression of left perihilar density which may be pneumonia or mass lesion. Continued close follow-up warranted Right upper lobe nodule unchanged COPD Electronically Signed   By: Franchot Gallo M.D.   On: 09/13/2022 18:19     Discharge Instructions:   Signed: Stanford Breed, Medical Student 09/16/2022, 11:15 AM   Pager: [phone]  Attestation for Student Documentation:  I personally was present and performed or re-performed the history, physical exam and medical decision-making activities of this service and have verified that the service and findings are accurately documented in the student's note.  Rick Duff, MD 09/16/2022, 8:07 PM

## 2022-09-16 NOTE — Progress Notes (Signed)
MRI complete/patient back on unit. No acute distress noted.

## 2022-09-16 NOTE — Hospital Course (Addendum)
Community Acquired Pneumonia Patient presented to the ED with fever and cough. Chest X-Ray showed progression of left perihilar density which may be pneumonia or mass lesion. A CT chest revealed continued opacity anteriorly and medially in the left upper lobe which appears to be more solid compared to prior exam, and most likely represents cavitary pneumonia.There is also noted 26 x 22 mm spiculated density in the lingular segment of left upper lobe highly concerning for malignancy. Grossly stable 25 x 14 mm mixed solid and cystic abnormality with spiculated margins is noted in lateral portion of right upper lobe also concerning for malignancy. Mild coronary artery calcifications are noted. Aortic Atherosclerosis and Emphysema also noted. Patient treated with 3 days of antibiotics with improvement in vitals, no longer has leukocytosis, and has remained stable. Patient will be discharged with oral Cefdinir to be taken for the next 4 days. Patient being followed by oncology for his known Stage IV SCC of lung and will continue radiation treatment.   2. Centrilobular Emphysema Patient has been treated with Brovana and Incruse Ellipta as well as Albuterol PRN. He has continued to saturate well since admission and will be discharged with his home regimen.  3. Chronic Hypotension Patient has been treated with Midodrine 2.5 TID and BP's have remained stable since admission. Patient to be discharged with home regimen.   4. HFrEF Most recent EF in March measured to be 40-45%. GDMT is limited by chronic hypotension, but patient has remained euvolemic. Patient instructed to follow up with cardiology.  5. CVA with residual hemiparesis, dysarthria, and Seizures  Patient has remained stable on Keppra, Dysphagia 1 diet, Plavix, and Atorvastatin. Patient to be discharged with home regimen.  6. History of mechanical fall with non-displaced C7 transverse process fracture and right-sided rib pain. Patient has been on  fall and delirium precautions since admission. Patient given lidocaine patch and Tylenol PRN for rib pain. PT consulted and recommends SNF, but patient does not want to go.   7. Chronic Normocytic Anemia Hgb stable since admission and is likely secondary to chronic disease state. Patient instructed to follow-up with colon cancer screening as an outpatient.  8. Right-sided headache Patient endorsed new-onset right-sided headache. Given known Stage IV SCC of lung, MRI ordered to rule out metastasis. MRI revealed no acute intracranial process. No evidence of acute or subacute infarct. Persistent diffusion restriction and intrinsic T1 hyperintense signal associated with areas of laminar necrosis from the remote left frontal and parietal infarcts. No abnormal parenchymal or meningeal enhancement. No evidence of metastatic disease in the brain. Air-fluid level in the right maxillary sinus, which can be seen in the setting of acute sinusitis. Patient reports feeling better but given the findings associated with sinusitis, patient was given Flonase to further relieve symptoms. Patient will be discharged with Flonase.

## 2022-09-16 NOTE — Progress Notes (Signed)
Physical Therapy Treatment Patient Details Name: Todd Estrada MRN: [id number] DOB: 1961/02/27 Today's Date: 09/16/2022   History of Present Illness Todd Estrada is a 61 y.o. male admitted under observation 09/13/22 due to persistent cough. CT of chest pending. Of note: recent hospitalizations on 9/9-11/2022 after an unwitnessed fall at home found to have a stable non-displaced fracture of C7/chest pain/cdiff. And on on 8/25 for sepsis 2/2 necrotizing PNA. CT head reveals nondisplaced R transverse process fx of C7. PMH includes PNA, COPD, CHF, CVA with aphasia and hemiplegia, lung SCC stage IV, seizures.    PT Comments    Patient agreeable to mobility and min assist provided to transfer supine>sit EOB. Pt slightly impatient to move to chair and returned to supine due to not wanting to wait for BP. Pt agreeable to sit up EOB for BP and then Min assist required for stand pivot transfer, therapist blocking Rt knee to facilitate extension in stance and reaching Lt UE to armrest to direct turn. Pt completed seated exercises and resting comfortably in recliner, alarm on. Will continue to progress pt as able. Continue to recommend SNF for rehab.    Recommendations for follow up therapy are one component of a multi-disciplinary discharge planning process, led by the attending physician.  Recommendations may be updated based on patient status, additional functional criteria and insurance authorization.  Follow Up Recommendations  Skilled nursing-short term rehab (<3 hours/day) Can patient physically be transported by private vehicle: Yes   Assistance Recommended at Discharge Frequent or constant Supervision/Assistance  Patient can return home with the following A lot of help with walking and/or transfers;A little help with bathing/dressing/bathroom;Assist for transportation;Help with stairs or ramp for entrance;Assistance with cooking/housework;Direct supervision/assist for medications management    Equipment Recommendations  None recommended by PT    Recommendations for Other Services       Precautions / Restrictions Precautions Precautions: Fall Precaution Comments: aphasia Cervical Brace:  (no brace - couldn't understand Pt to see if still required by MD) Other Brace: AFO RLE, brace RUE, no neck brace in room Restrictions Weight Bearing Restrictions: No     Mobility  Bed Mobility Overal bed mobility: Needs Assistance Bed Mobility: Supine to Sit, Sit to Supine     Supine to sit: Min assist Sit to supine: Min assist   General bed mobility comments: assist to fully raise trunk and bring Rt LE off EOB. pt returned to supine 1x but wtih cues willing to sit up EOB, pt appeared impatient to move to chair once EOB.    Transfers Overall transfer level: Needs assistance Equipment used: 1 person hand held assist Transfers: Sit to/from Stand, Bed to chair/wheelchair/BSC Sit to Stand: Min assist Stand pivot transfers: Min assist         General transfer comment: Min assist with HHA and blocking Rt knee to maintain extension in standing. Patient able to rise withmin assist and reach Lt UE to chair armrest. Min assist to guide pivot/turnto move bed>chair.    Ambulation/Gait                   Stairs             Wheelchair Mobility    Modified Rankin (Stroke Patients Only)       Balance Overall balance assessment: Needs assistance Sitting-balance support: No upper extremity supported, Feet supported Sitting balance-Leahy Scale: Fair     Standing balance support: Single extremity supported Standing balance-Leahy Scale: Poor Standing balance comment: Reliant on UE  and external support                            Cognition Arousal/Alertness: Awake/alert Behavior During Therapy: WFL for tasks assessed/performed Overall Cognitive Status: Difficult to assess                                 General Comments: Follows simple  commands, but difficult to assess secondary to communication deficits. pt very good at yes/no answers.        Exercises  10x LAQ Lt LE, 10x marching Lt LE    General Comments        Pertinent Vitals/Pain Pain Assessment Pain Assessment: Faces Faces Pain Scale: No hurt Pain Intervention(s): Monitored during session, Repositioned    Home Living                          Prior Function            PT Goals (current goals can now be found in the care plan section) Acute Rehab PT Goals PT Goal Formulation: Patient unable to participate in goal setting Time For Goal Achievement: 09/28/22 Potential to Achieve Goals: Good Progress towards PT goals: Progressing toward goals    Frequency    Min 2X/week      PT Plan Current plan remains appropriate    Co-evaluation              AM-PAC PT "6 Clicks" Mobility   Outcome Measure  Help needed turning from your back to your side while in a flat bed without using bedrails?: A Little Help needed moving from lying on your back to sitting on the side of a flat bed without using bedrails?: A Little Help needed moving to and from a bed to a chair (including a wheelchair)?: A Little Help needed standing up from a chair using your arms (e.g., wheelchair or bedside chair)?: A Little Help needed to walk in hospital room?: Total Help needed climbing 3-5 steps with a railing? : Total 6 Click Score: 14    End of Session Equipment Utilized During Treatment: Gait belt Activity Tolerance: Patient tolerated treatment well Patient left: in chair;with call bell/phone within reach;with chair alarm set (RN notified alarm on, no cord for nursing station) Nurse Communication: Mobility status PT Visit Diagnosis: Other abnormalities of gait and mobility (R26.89);Muscle weakness (generalized) (M62.81);Difficulty in walking, not elsewhere classified (R26.2) Hemiplegia - Right/Left: Right Hemiplegia - dominant/non-dominant:  Dominant Hemiplegia - caused by: Unspecified     Time: 3846-6599 PT Time Calculation (min) (ACUTE ONLY): 23 min  Charges:  $Therapeutic Activity: 23-37 mins                     Verner Mould, DPT Acute Rehabilitation Services Office [phone]  09/16/22 1:49 PM

## 2022-09-19 ENCOUNTER — Other Ambulatory Visit: Payer: Self-pay | Admitting: Internal Medicine

## 2022-09-19 LAB — CULTURE, BLOOD (ROUTINE X 2)
Culture: NO GROWTH
Culture: NO GROWTH
Special Requests: ADEQUATE

## 2022-09-20 ENCOUNTER — Other Ambulatory Visit: Payer: Self-pay | Admitting: Student

## 2022-09-20 MED ORDER — FOLIC ACID 1 MG PO TABS: 1.0000 mg | ORAL_TABLET | Freq: Every day | ORAL | 2 refills | Status: DC

## 2022-09-20 MED ORDER — ADULT MULTIVITAMIN W/MINERALS CH: 1.0000 | ORAL_TABLET | Freq: Every day | ORAL | 2 refills | Status: DC

## 2022-09-20 NOTE — Telephone Encounter (Signed)
Refill Request  budesonide (PULMICORT) 0.5 MG/2ML nebulizer solution  folic acid (FOLVITE) 1 MG tablet  clopidogrel (PLAVIX) 75 MG tablet  Multiple Vitamin (MULTIVITAMIN WITH MINERALS) TABS tablet   [id number] - [place], [place] - [address] AT [address]

## 2022-09-20 NOTE — Telephone Encounter (Signed)
There are refills on Pulmicort and Plavix - this was relayed to pt's caregiver; informed him to call the pharmacy. Pt does need refills for MVI and Folic acid.

## 2022-09-25 ENCOUNTER — Other Ambulatory Visit: Payer: Self-pay

## 2022-09-25 ENCOUNTER — Emergency Department (HOSPITAL_COMMUNITY): Payer: Medicaid Other

## 2022-09-25 ENCOUNTER — Emergency Department (HOSPITAL_COMMUNITY)
Admission: EM | Admit: 2022-09-25 | Discharge: 2022-09-25 | Disposition: A | Discharge status: Home / Self Care | Payer: Medicaid Other | Attending: Emergency Medicine | Admitting: Emergency Medicine

## 2022-09-25 DIAGNOSIS — W19XXXA Unspecified fall, initial encounter: Secondary | ICD-10-CM | POA: Insufficient documentation

## 2022-09-25 DIAGNOSIS — M79601 Pain in right arm: Secondary | ICD-10-CM | POA: Insufficient documentation

## 2022-09-25 DIAGNOSIS — Z20822 Contact with and (suspected) exposure to covid-19: Secondary | ICD-10-CM | POA: Insufficient documentation

## 2022-09-25 DIAGNOSIS — R519 Headache, unspecified: Secondary | ICD-10-CM | POA: Insufficient documentation

## 2022-09-25 DIAGNOSIS — Z7902 Long term (current) use of antithrombotics/antiplatelets: Secondary | ICD-10-CM | POA: Insufficient documentation

## 2022-09-25 DIAGNOSIS — M25522 Pain in left elbow: Secondary | ICD-10-CM | POA: Insufficient documentation

## 2022-09-25 LAB — CBC WITH DIFFERENTIAL/PLATELET
Abs Immature Granulocytes: 0.04 10*3/uL (ref 0.00–0.07)
Basophils Absolute: 0.1 10*3/uL (ref 0.0–0.1)
Basophils Relative: 1 %
Eosinophils Absolute: 0.1 10*3/uL (ref 0.0–0.5)
Eosinophils Relative: 1 %
HCT: 37.2 % — ABNORMAL LOW (ref 39.0–52.0)
Hemoglobin: 11.1 g/dL — ABNORMAL LOW (ref 13.0–17.0)
Immature Granulocytes: 1 %
Lymphocytes Relative: 9 %
Lymphs Abs: 0.7 10*3/uL (ref 0.7–4.0)
MCH: 30.1 pg (ref 26.0–34.0)
MCHC: 29.8 g/dL — ABNORMAL LOW (ref 30.0–36.0)
MCV: 100.8 fL — ABNORMAL HIGH (ref 80.0–100.0)
Monocytes Absolute: 0.3 10*3/uL (ref 0.1–1.0)
Monocytes Relative: 4 %
Neutro Abs: 6.6 10*3/uL (ref 1.7–7.7)
Neutrophils Relative %: 84 %
Platelets: 319 10*3/uL (ref 150–400)
RBC: 3.69 MIL/uL — ABNORMAL LOW (ref 4.22–5.81)
RDW: 15 % (ref 11.5–15.5)
WBC: 7.8 10*3/uL (ref 4.0–10.5)
nRBC: 0 % (ref 0.0–0.2)

## 2022-09-25 LAB — CBG MONITORING, ED: Glucose-Capillary: 136 mg/dL — ABNORMAL HIGH (ref 70–99)

## 2022-09-25 LAB — I-STAT CHEM 8, ED
BUN: 11 mg/dL (ref 8–23)
Calcium, Ion: 1.23 mmol/L (ref 1.15–1.40)
Chloride: 101 mmol/L (ref 98–111)
Creatinine, Ser: 0.7 mg/dL (ref 0.61–1.24)
Glucose, Bld: 118 mg/dL — ABNORMAL HIGH (ref 70–99)
HCT: 36 % — ABNORMAL LOW (ref 39.0–52.0)
Hemoglobin: 12.2 g/dL — ABNORMAL LOW (ref 13.0–17.0)
Potassium: 3.6 mmol/L (ref 3.5–5.1)
Sodium: 141 mmol/L (ref 135–145)
TCO2: 31 mmol/L (ref 22–32)

## 2022-09-25 LAB — SAMPLE TO BLOOD BANK

## 2022-09-25 LAB — LACTIC ACID, PLASMA
Lactic Acid, Venous: 3.2 mmol/L (ref 0.5–1.9)
Lactic Acid, Venous: 1.4 mmol/L (ref 0.5–1.9)

## 2022-09-25 LAB — URINALYSIS, ROUTINE W REFLEX MICROSCOPIC
Bilirubin Urine: NEGATIVE
Glucose, UA: NEGATIVE mg/dL
Hgb urine dipstick: NEGATIVE
Ketones, ur: NEGATIVE mg/dL
Leukocytes,Ua: NEGATIVE
Nitrite: NEGATIVE
Protein, ur: NEGATIVE mg/dL
Specific Gravity, Urine: 1.01 (ref 1.005–1.030)
pH: 7 (ref 5.0–8.0)

## 2022-09-25 LAB — TROPONIN I (HIGH SENSITIVITY)
Troponin I (High Sensitivity): 4 ng/L (ref <18)
Troponin I (High Sensitivity): 4 ng/L (ref <18)

## 2022-09-25 LAB — SARS CORONAVIRUS 2 BY RT PCR: SARS Coronavirus 2 by RT PCR: NEGATIVE

## 2022-09-25 LAB — PROTIME-INR
INR: 1.1 (ref 0.8–1.2)
Prothrombin Time: 13.7 seconds (ref 11.4–15.2)

## 2022-09-25 MED ORDER — SODIUM CHLORIDE 0.9 % IV BOLUS: 1000.0000 mL | Freq: Once | INTRAVENOUS | Status: DC

## 2022-09-25 MED ORDER — LACTATED RINGERS IV BOLUS
1000.0000 mL | Freq: Once | INTRAVENOUS | Status: AC
  Administered 2022-09-25: 1000 mL via INTRAVENOUS

## 2022-09-25 NOTE — ED Provider Notes (Signed)
[facility] DEPT Provider Note   CSN: [id number] Arrival date & time: 09/25/22  1337     History  Chief Complaint  Patient presents with   Fall   HPI Todd Estrada is a 61 y.o. male with stage IV squamous cell carcinoma of the lung, recent CVA in march 2023, recent hospital admission for pneumonia, and chronic hypotension presenting for a fall. Patient was dropped off by a family member stating he fell today.  In triage, patient stated that he was dizzy right before he fell but said that he tripped.  Denies hitting his head or loss of consciousness.  Patient can answer yes or no questions but is otherwise nonverbal. Cousin via phone stated that he fell an hit his face and that patient also endorsed left elbow and right arm pain after the fall. Cousin mentioned that he is non verbal but can respond to yes or no questions.    Fall       Home Medications Prior to Admission medications   Medication Sig Start Date End Date Taking? Authorizing Provider  albuterol (PROVENTIL) (2.5 MG/3ML) 0.083% nebulizer solution Take 3 mLs (2.5 mg total) by nebulization every 4 (four) hours as needed for wheezing or shortness of breath. 08/16/22   Lacinda Axon, MD  atorvastatin (LIPITOR) 40 MG tablet Take 1 tablet (40 mg total) by mouth daily. 05/25/22 05/25/23  Lacinda Axon, MD  budesonide (PULMICORT) 0.5 MG/2ML nebulizer solution USE 2 ML(0.5 MG) VIA NEBULIZER TWICE DAILY Patient taking differently: Take 0.5 mg by nebulization 2 (two) times daily. 06/27/22   Freddi Starr, MD  cefdinir (OMNICEF) 300 MG capsule Take 1 capsule (300 mg total) by mouth 2 (two) times daily. 09/16/22   Rick Duff, MD  clopidogrel (PLAVIX) 75 MG tablet Take 1 tablet (75 mg total) by mouth daily. Okay to restart this medication on 05/31/2022 Patient taking differently: Take 75 mg by mouth daily. 06/28/22   Gaylan Gerold, DO  diclofenac Sodium (VOLTAREN) 1 % GEL Apply 2 g topically  4 (four) times daily. Patient not taking: Reported on 09/14/2022 08/30/22   Leigh Aurora, DO  folic acid (FOLVITE) 1 MG tablet Take 1 tablet (1 mg total) by mouth daily. 09/20/22   Gaylan Gerold, DO  ketotifen (ALLERGY EYE DROPS) 0.025 % ophthalmic solution Place 1 drop into the right eye 2 (two) times daily. Patient not taking: Reported on 09/14/2022 08/30/22   Leigh Aurora, DO  levETIRAcetam (KEPPRA) 500 MG tablet Take 1 tablet (500 mg total) by mouth 2 (two) times daily. 06/02/22   Marcial Pacas, MD  lidocaine (LIDODERM) 5 % Place 1 patch onto the skin daily. Remove & Discard patch within 12 hours or as directed by MD 08/31/22   Leigh Aurora, DO  midodrine (PROAMATINE) 2.5 MG tablet Take 1 tablet (2.5 mg total) by mouth 3 (three) times daily with meals. 08/30/22 02/26/23  Leigh Aurora, DO  Multiple Vitamin (MULTIVITAMIN WITH MINERALS) TABS tablet Take 1 tablet by mouth daily. 09/20/22   Gaylan Gerold, DO  nicotine (NICODERM CQ - DOSED IN MG/24 HOURS) 14 mg/24hr patch APPLY 1 PATCH(14 MG) TOPICALLY TO THE SKIN DAILY 09/20/22   Gaylan Gerold, DO  nicotine (NICODERM CQ - DOSED IN MG/24 HOURS) 14 mg/24hr patch APPLY 1 PATCH(14 MG) TOPICALLY TO THE SKIN DAILY 09/20/22   Gaylan Gerold, DO  ondansetron (ZOFRAN) 4 MG tablet Take 4 mg by mouth every 8 (eight) hours as needed for nausea or vomiting.  [provider]  STIOLTO RESPIMAT 2.5-2.5 MCG/ACT AERS INHALE 2 PUFFS INTO THE LUNGS DAILY 06/17/22   Freddi Starr, MD      Allergies    Patient has no known allergies.    Review of Systems   Review of Systems  Constitutional:        Fall    Physical Exam Updated Vital Signs BP 125/82   Pulse 74   Temp 97.6 F (36.4 C)   Resp 18   SpO2 100%  Physical Exam Vitals and nursing note reviewed.  Constitutional:      General: He is not in acute distress.    Appearance: He is well-developed.  HENT:     Head: Normocephalic and atraumatic.     Nose:     Comments: Dried blood noted in the left nare.     Mouth/Throat:     Mouth: Mucous membranes are moist.  Eyes:     General:        Right eye: No discharge.        Left eye: No discharge.     Conjunctiva/sclera: Conjunctivae normal.  Cardiovascular:     Rate and Rhythm: Normal rate and regular rhythm.     Pulses: Normal pulses.     Heart sounds: Normal heart sounds. No murmur heard. Pulmonary:     Effort: Pulmonary effort is normal. No respiratory distress.     Breath sounds: Normal breath sounds.  Abdominal:     General: Abdomen is flat.     Palpations: Abdomen is soft.     Tenderness: There is no abdominal tenderness.  Musculoskeletal:        General: No swelling.     Cervical back: Neck supple.  Skin:    General: Skin is warm and dry.     Capillary Refill: Capillary refill takes less than 2 seconds.     Coloration: Skin is pale.  Neurological:     General: No focal deficit present.     Comments: Able to move all extremities while laying down in stretcher.  Did not assess gait.  Right side appears weak and range of motion limited in comparison to the left side of his body.  Able to answer yes or no questions but otherwise nonverbal.  Patient aware that he is in the hospital.  Psychiatric:        Mood and Affect: Mood normal.     ED Results / Procedures / Treatments   Labs (all labs ordered are listed, but only abnormal results are displayed) Labs Reviewed  CBC WITH DIFFERENTIAL/PLATELET - Abnormal; Notable for the following components:      Result Value   RBC 3.69 (*)    Hemoglobin 11.1 (*)    HCT 37.2 (*)    MCV 100.8 (*)    MCHC 29.8 (*)    All other components within normal limits  LACTIC ACID, PLASMA - Abnormal; Notable for the following components:   Lactic Acid, Venous 3.2 (*)    All other components within normal limits  I-STAT CHEM 8, ED - Abnormal; Notable for the following components:   Glucose, Bld 118 (*)    Hemoglobin 12.2 (*)    HCT 36.0 (*)    All other components within normal limits  CBG  MONITORING, ED - Abnormal; Notable for the following components:   Glucose-Capillary 136 (*)    All other components within normal limits  SARS CORONAVIRUS 2 BY RT PCR  URINE CULTURE  LACTIC ACID, PLASMA  PROTIME-INR  URINALYSIS, ROUTINE W REFLEX MICROSCOPIC  LEVETIRACETAM LEVEL  LACTIC ACID, PLASMA  LACTIC ACID, PLASMA  SAMPLE TO BLOOD BANK  TROPONIN I (HIGH SENSITIVITY)  TROPONIN I (HIGH SENSITIVITY)    EKG None  Radiology DG Wrist Complete Right  Result Date: 09/25/2022 CLINICAL DATA:  Trauma, fall EXAM: RIGHT WRIST - COMPLETE 3+ VIEW COMPARISON:  None Available. FINDINGS: No displaced fracture or dislocation is seen. Osteopenia is seen in bony structures. Degenerative changes are noted with bony spurs in first carpometacarpal joint. IMPRESSION: No displaced fracture is seen in right wrist. Degenerative changes are noted in first carpometacarpal joint. Osteopenia. Electronically Signed   By: Elmer Picker M.D.   On: 09/25/2022 17:27   DG Hand Complete Right  Result Date: 09/25/2022 CLINICAL DATA:  Trauma, pain EXAM: RIGHT HAND - COMPLETE 3+ VIEW COMPARISON:  None Available. FINDINGS: There is deformity in the neck of right second metacarpal. There is mild dorsal angulation. Rest of the visualized bony structures appear intact. Osteopenia is seen in bony structures. IMPRESSION: Deformity is noted in the neck of right second metacarpal suggesting recent or old impacted fracture. Electronically Signed   By: Elmer Picker M.D.   On: 09/25/2022 17:26   DG Clavicle Right  Result Date: 09/25/2022 CLINICAL DATA:  Trauma, pain EXAM: RIGHT CLAVICLE - 2+ VIEWS COMPARISON:  None Available. FINDINGS: No displaced fracture or dislocation is seen. Deformity in the lateral end of right clavicle may be residual from previous injury. Degenerative changes are noted in right AC joint. There is decreased distance between humeral head and acromion suggesting chronic tear of rotator cuff.  IMPRESSION: No recent fracture is seen. Degenerative changes are noted in right AC joint. Deformity in the lateral end of right clavicle may be residual from previous injury. Electronically Signed   By: Elmer Picker M.D.   On: 09/25/2022 17:24   DG Shoulder Right  Result Date: 09/25/2022 CLINICAL DATA:  Trauma, fall EXAM: RIGHT SHOULDER - 2+ VIEW COMPARISON:  None Available. FINDINGS: No fracture or dislocation is seen. There is decreased distance between acromion and humeral head suggesting possible chronic tear of rotator cuff. Deformity in the lateral end of right clavicle may be residual from previous injury. IMPRESSION: No recent fracture or dislocation is seen in right shoulder. Electronically Signed   By: Elmer Picker M.D.   On: 09/25/2022 17:22   DG Humerus Right  Result Date: 09/25/2022 CLINICAL DATA:  Trauma, pain EXAM: RIGHT HUMERUS - 2+ VIEW COMPARISON:  None Available. FINDINGS: No fracture or dislocation is seen. Degenerative changes are noted in right AC joint. IMPRESSION: No fracture or dislocation is seen in right humerus. Degenerative changes are noted in right AC joint. Electronically Signed   By: Elmer Picker M.D.   On: 09/25/2022 17:21   DG Elbow Complete Right  Result Date: 09/25/2022 CLINICAL DATA:  Trauma, fall EXAM: RIGHT ELBOW - COMPLETE 3+ VIEW COMPARISON:  None Available. FINDINGS: No recent fracture or dislocation is seen. There is no displacement of posterior fat pad. IMPRESSION: No fracture or dislocation is seen in right wrist. Electronically Signed   By: Elmer Picker M.D.   On: 09/25/2022 17:19   CT Maxillofacial WO CM  Result Date: 09/25/2022 CLINICAL DATA:  Trauma, fall EXAM: CT MAXILLOFACIAL WITHOUT CONTRAST TECHNIQUE: Multidetector CT imaging of the maxillofacial structures was performed. Multiplanar CT image reconstructions were also generated. RADIATION DOSE REDUCTION: This exam was performed according to the departmental  dose-optimization program which includes automated exposure control, adjustment  of the mA and/or kV according to patient size and/or use of iterative reconstruction technique. COMPARISON:  None Available. FINDINGS: Osseous: No recent fracture is seen. Right maxillary sinus appears smaller than left. Orbits: Optic globes are symmetrical. Retrobulbar soft tissues are unremarkable. Sinuses: Right maxillary sinus is smaller than left. There is defect in the medial wall of right maxillary sinus, possibly suggesting previous surgery. There is mucosal thickening in maxillary sinuses, more so on the right side. There are no air-fluid levels in paranasal sinuses. There is deviation of nasal septum to the right. Concha bullosa is seen in the superior turbinates. Soft tissues: Unremarkable. Limited intracranial: There is low-density in the left parietal cortex, possibly old infarct. IMPRESSION: No recent fracture is seen in facial bones. Right maxillary sinus is smaller in size along with defect in the medial wall. These findings may be residual from previous surgery. Chronic maxillary sinusitis, more so on the right side. There is deviation of nasal septum to the right. Electronically Signed   By: Elmer Picker M.D.   On: 09/25/2022 17:17   DG Forearm Right  Result Date: 09/25/2022 CLINICAL DATA:  Status post trauma. EXAM: RIGHT FOREARM - 2 VIEW COMPARISON:  None Available. FINDINGS: There is no evidence of an acute fracture. A chronic appearing deformity is seen involving the lateral epicondyle of the distal right humerus. There is no evidence of dislocation. Diffuse osteopenia is noted. Degenerative changes are seen within the visualized portion of the right wrist. Soft tissues are unremarkable. IMPRESSION: Chronic appearing deformity involving the lateral epicondyle of the distal right humerus. Electronically Signed   By: Virgina Norfolk M.D.   On: 09/25/2022 17:12   DG Elbow Complete Left  Result Date:  09/25/2022 CLINICAL DATA:  Status post trauma. EXAM: LEFT ELBOW - COMPLETE 3+ VIEW COMPARISON:  None Available. FINDINGS: There is no evidence of an acute fracture, dislocation, or joint effusion. A chronic appearing deformity is seen involving the lateral epicondyle of the distal left humerus. A small chronic deformity of the medial epicondyle of the distal left humerus is also noted. There is no evidence of arthropathy or other focal bone abnormality. Soft tissues are unremarkable. IMPRESSION: Chronic appearing deformities involving the medial and lateral epicondyles of the distal left humerus. Correlation with physical examination is recommended to determine the presence of point tenderness and further exclude the presence of an acute fracture. Electronically Signed   By: Virgina Norfolk M.D.   On: 09/25/2022 17:05   CT Head Wo Contrast  Result Date: 09/25/2022 CLINICAL DATA:  Fall.  Head trauma. EXAM: CT HEAD WITHOUT CONTRAST CT CERVICAL SPINE WITHOUT CONTRAST TECHNIQUE: Multidetector CT imaging of the head and cervical spine was performed following the standard protocol without intravenous contrast. Multiplanar CT image reconstructions of the cervical spine were also generated. RADIATION DOSE REDUCTION: This exam was performed according to the departmental dose-optimization program which includes automated exposure control, adjustment of the mA and/or kV according to patient size and/or use of iterative reconstruction technique. COMPARISON:  08/27/2022. FINDINGS: CT HEAD FINDINGS Brain: No evidence of acute infarction, hemorrhage, hydrocephalus, extra-axial collection or mass lesion/mass effect. 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 These findings are stable. Vascular: No hyperdense vessel or unexpected calcification. Skull: Normal. Negative for fracture or focal lesion. Sinuses/Orbits: Globes and orbits are unremarkable. Mild mucosal thickening and dependent fluid in the right  maxillary sinus. Other: None. CT CERVICAL SPINE FINDINGS Alignment: Normal. Skull base and vertebrae: No acute fracture. No primary bone lesion or  focal pathologic process. Soft tissues and spinal canal: No prevertebral fluid or swelling. No visible canal hematoma. Disc levels: Mild loss of disc height at C6-C7. Remaining disc spaces are well preserved. There are endplate osteophytes from C4 through C7. Disc bulging with endplate spurring noted at C6-C7. No convincing disc herniation. Mild facet degenerative changes. Upper chest: No acute findings.  Emphysema at the lung apices. Other: None. IMPRESSION: HEAD CT 1. No acute intracranial abnormalities. 2. Stable old infarcts. CERVICAL CT 1. No fracture or acute finding. Electronically Signed   By: Lajean Manes M.D.   On: 09/25/2022 15:05   CT Cervical Spine Wo Contrast  Result Date: 09/25/2022 CLINICAL DATA:  Fall.  Head trauma. EXAM: CT HEAD WITHOUT CONTRAST CT CERVICAL SPINE WITHOUT CONTRAST TECHNIQUE: Multidetector CT imaging of the head and cervical spine was performed following the standard protocol without intravenous contrast. Multiplanar CT image reconstructions of the cervical spine were also generated. RADIATION DOSE REDUCTION: This exam was performed according to the departmental dose-optimization program which includes automated exposure control, adjustment of the mA and/or kV according to patient size and/or use of iterative reconstruction technique. COMPARISON:  08/27/2022. FINDINGS: CT HEAD FINDINGS Brain: No evidence of acute infarction, hemorrhage, hydrocephalus, extra-axial collection or mass lesion/mass effect. 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 These findings are stable. Vascular: No hyperdense vessel or unexpected calcification. Skull: Normal. Negative for fracture or focal lesion. Sinuses/Orbits: Globes and orbits are unremarkable. Mild mucosal thickening and dependent fluid in the right maxillary sinus. Other:  None. CT CERVICAL SPINE FINDINGS Alignment: Normal. Skull base and vertebrae: No acute fracture. No primary bone lesion or focal pathologic process. Soft tissues and spinal canal: No prevertebral fluid or swelling. No visible canal hematoma. Disc levels: Mild loss of disc height at C6-C7. Remaining disc spaces are well preserved. There are endplate osteophytes from C4 through C7. Disc bulging with endplate spurring noted at C6-C7. No convincing disc herniation. Mild facet degenerative changes. Upper chest: No acute findings.  Emphysema at the lung apices. Other: None. IMPRESSION: HEAD CT 1. No acute intracranial abnormalities. 2. Stable old infarcts. CERVICAL CT 1. No fracture or acute finding. Electronically Signed   By: Lajean Manes M.D.   On: 09/25/2022 15:05   DG Chest Port 1 View  Result Date: 09/25/2022 CLINICAL DATA:  Chest pain following fall.  History of lung cancer. EXAM: PORTABLE CHEST 1 VIEW COMPARISON:  09/14/2022 chest CT and 09/13/2022 chest radiograph. FINDINGS: The cardiomediastinal silhouette is unchanged. LEFT perihilar and LEFT UPPER lobe opacity again noted. Faint RIGHT midlung opacity with marker is unchanged. There is no evidence of pneumothorax or pleural effusion. No acute bony abnormalities are present. IMPRESSION: Unchanged appearance of the chest with LEFT perihilar and LEFT UPPER lobe opacity and faint RIGHT midlung opacity. Electronically Signed   By: Margarette Canada M.D.   On: 09/25/2022 14:44   DG Pelvis Portable  Result Date: 09/25/2022 CLINICAL DATA:  Fall.  Pain. EXAM: PORTABLE PELVIS 1-2 VIEWS COMPARISON:  None Available. FINDINGS: No fracture or bone lesion. Hip joints, SI joints and symphysis pubis are normally spaced and aligned. Bilateral iliac and femoral artery vascular calcifications. IMPRESSION: No fracture or acute finding.  No joint abnormality. Electronically Signed   By: Lajean Manes M.D.   On: 09/25/2022 14:41    Procedures Procedures    Medications  Ordered in ED Medications  lactated ringers bolus 1,000 mL (0 mLs Intravenous Stopped 09/25/22 1758)    ED Course/ Medical Decision Making/ A&P  Clinical Course as of 09/25/22 Berniece Pap Sep 25, 2022  1554 Monocyte #: [id number] [JR]    Clinical Course User Index [JR] Harriet Pho, PA-C                           Medical Decision Making Amount and/or Complexity of Data Reviewed Labs: ordered. Decision-making details documented in ED Course. Radiology: ordered.   This patient presents to the ED for concern of fall, this involves a number of treatment options, and is a complaint that carries with it a high risk of complications and morbidity.  The differential diagnosis includes skull fracture, ICH, ACS, sepsis, and arrhythmia.    Co morbidities: Discussed in HPI  EMR reviewed including pt PMHx, past surgical history and past visits to ER.   See HPI for more details   Lab Tests:   I ordered and independently interpreted labs. Labs notable for elevated lactate, anemia   Imaging Studies:  NAD. I personally reviewed all imaging studies and no acute abnormality found. I agree with radiology interpretation.    Cardiac Monitoring:  The patient was maintained on a cardiac monitor.  I personally viewed and interpreted the cardiac monitored which showed an underlying rhythm of: NSR EKG non-ischemic   Medicines ordered:  I ordered medication including lactated Ringer's for volume resuscitation.  Offered pain medication but patient denied stating that he was not in any pain. Reevaluation of the patient after these medicines showed that the patient improved.  Before LR bolus lactate was 3.2.  After bolus administration recheck lactate was 1.4. I have reviewed the patients home medicines and have made adjustments as needed   Consults/Attending Physician   I discussed this case with my attending physician who cosigned this note including patient's presenting symptoms, physical  exam, and planned diagnostics and interventions. Attending physician stated agreement with plan or made changes to plan which were implemented.   Reevaluation:  After the interventions noted above I re-evaluated patient and found that they have :improved    Problem List / ED Course:  Patient presented after a fall at home.  Initially hypotensive and tachycardic on arrival.  Patient was dropped off at the ED and is nonverbal.  Unclear at first how he fell or what caused him to to fall.  Considered ICH and skull fracture but unlikely given CT findings.  Neuro exam is also reassuring.  Spoke to cousin who said he fell on his face and also that patient endorsed left elbow pain and right arm pain.  Considered maxillofacial fracture but unlikely given CT scan.  X-rays of left elbow and right arm were all reassuring and did not reveal acute process.  Lactate was initially elevated but after volume resuscitation improved to within normal limits.  Likely bump likely related to hypovolemia given also that he was tachycardic and somewhat hypotensive before volume bolus.  Vitals also stabilized after volume bolus.  Also considered cardiac etiology but unlikely given normal EKG, and troponins within normal limits.  Also patient did not endorse chest pain or shortness of breath.   Dispostion:  After consideration of the diagnostic results and the patients response to treatment, I feel that the patient would benefit from discharge and follow-up with PCP regarding fall today.         Final Clinical Impression(s) / ED Diagnoses Final diagnoses:  Fall, initial encounter    Rx / DC Orders ED Discharge Orders     None  Harriet Pho, PA-C 09/25/22 Nils Flack, MD 09/26/22 1212

## 2022-09-25 NOTE — ED Notes (Signed)
Spoke with patient's cousinJeneen Rinks. Per [name], he dropped off patient because pt fell, complaining of neck pain and arm pain. States patient incomprehensible speech at baseline due to h/o stroke, pt answers only to YES/NO appropriately, unable to communicate by writing.

## 2022-09-25 NOTE — ED Triage Notes (Signed)
Pt unable to communicate due to old CVA at baseline.  Cousin dropped him off and reports to greeter that patient fell and hit head and complaining of bilateral arm pain.  Unsure if LOC, blood thinner usage.

## 2022-09-25 NOTE — Discharge Instructions (Addendum)
Evaluation for your fall is overall reassuring.  CT scans and x-rays did not show any acute process. However, recommend that you follow-up with your PCP regarding your recent fall. If you have new visual disturbance, new facial droop, changes in your gait, new chest pain shortness of breath please return to the emergency department for further evaluation.

## 2022-09-26 ENCOUNTER — Telehealth: Payer: Self-pay | Admitting: *Deleted

## 2022-09-26 NOTE — Telephone Encounter (Signed)
Call from [facility].  Needs order for 1 more visit with patient after initial assessment last week.  Heart Failure Education, Infection Control, Mobility and Health Promotion.  Call back number [phone].

## 2022-09-27 LAB — URINE CULTURE: Culture: NO GROWTH

## 2022-09-27 LAB — LEVETIRACETAM LEVEL: Levetiracetam Lvl: 22 ug/mL (ref 10.0–40.0)

## 2022-10-10 ENCOUNTER — Telehealth: Payer: Self-pay | Admitting: *Deleted

## 2022-10-10 ENCOUNTER — Ambulatory Visit
Admit: 2022-10-10 | Discharge: 2022-10-10 | Disposition: A | Discharge status: Home / Self Care | Payer: Medicaid Other | Attending: Radiation Oncology | Admitting: Radiation Oncology

## 2022-10-10 ENCOUNTER — Other Ambulatory Visit: Payer: Self-pay

## 2022-10-10 NOTE — Telephone Encounter (Signed)
Returned patient's phone call, spoke with patient's relative- Livia Snellen

## 2022-10-10 NOTE — Progress Notes (Addendum)
  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  Intent: Curative  Radiation Treatment Dates:  07/04/2022 through 09/01/2022 SBRT Treatment Site Technique Total Dose (Gy) Dose per Fx (Gy) Completed Fx Beam Energies  Lung, Right: Lung_R_RUL IMRT 54/54 18 3/3 6XFFF  (as documented in provider EOT note)   The patient was available for call today but came into the clinic for his Post-Treat interview.   Symptoms of fatigue have improved since completing therapy but patient continues to have some moderate SOB, that's managed w/ Albuterol.  Symptoms of skin changes have  improved since completing therapy.  Symptoms of esophagitis have  improved since completing therapy.   The patient has scheduled follow up with his medical oncologist Dr. Curt Bears for ongoing care, and was encouraged to call if he develops concerns or questions regarding radiation.   This concludes the Post-Treatment-interview.   Leandra Kern, LPN

## 2022-10-12 ENCOUNTER — Inpatient Hospital Stay (HOSPITAL_COMMUNITY)
Admission: EM | Admit: 2022-10-12 | Discharge: 2022-10-14 | DRG: 175 | Disposition: A | Discharge status: Home / Self Care | Payer: Medicaid Other | Attending: Internal Medicine | Admitting: Internal Medicine

## 2022-10-12 ENCOUNTER — Emergency Department (HOSPITAL_COMMUNITY): Payer: Medicaid Other

## 2022-10-12 ENCOUNTER — Encounter (HOSPITAL_COMMUNITY): Payer: Self-pay | Admitting: *Deleted

## 2022-10-12 ENCOUNTER — Observation Stay (HOSPITAL_COMMUNITY): Payer: Medicaid Other

## 2022-10-12 ENCOUNTER — Other Ambulatory Visit: Payer: Self-pay

## 2022-10-12 DIAGNOSIS — I2510 Atherosclerotic heart disease of native coronary artery without angina pectoris: Secondary | ICD-10-CM | POA: Diagnosis present

## 2022-10-12 DIAGNOSIS — R1310 Dysphagia, unspecified: Secondary | ICD-10-CM | POA: Diagnosis present

## 2022-10-12 DIAGNOSIS — D630 Anemia in neoplastic disease: Secondary | ICD-10-CM | POA: Diagnosis present

## 2022-10-12 DIAGNOSIS — Z8673 Personal history of transient ischemic attack (TIA), and cerebral infarction without residual deficits: Secondary | ICD-10-CM | POA: Diagnosis present

## 2022-10-12 DIAGNOSIS — J189 Pneumonia, unspecified organism: Secondary | ICD-10-CM | POA: Diagnosis not present

## 2022-10-12 DIAGNOSIS — Z808 Family history of malignant neoplasm of other organs or systems: Secondary | ICD-10-CM

## 2022-10-12 DIAGNOSIS — Z87898 Personal history of other specified conditions: Secondary | ICD-10-CM

## 2022-10-12 DIAGNOSIS — I639 Cerebral infarction, unspecified: Secondary | ICD-10-CM | POA: Diagnosis present

## 2022-10-12 DIAGNOSIS — J188 Other pneumonia, unspecified organism: Secondary | ICD-10-CM | POA: Diagnosis present

## 2022-10-12 DIAGNOSIS — R569 Unspecified convulsions: Secondary | ICD-10-CM

## 2022-10-12 DIAGNOSIS — I2699 Other pulmonary embolism without acute cor pulmonale: Principal | ICD-10-CM | POA: Diagnosis present

## 2022-10-12 DIAGNOSIS — Z800 Family history of malignant neoplasm of digestive organs: Secondary | ICD-10-CM

## 2022-10-12 DIAGNOSIS — Z515 Encounter for palliative care: Secondary | ICD-10-CM

## 2022-10-12 DIAGNOSIS — I5042 Chronic combined systolic (congestive) and diastolic (congestive) heart failure: Secondary | ICD-10-CM | POA: Diagnosis present

## 2022-10-12 DIAGNOSIS — I110 Hypertensive heart disease with heart failure: Secondary | ICD-10-CM | POA: Diagnosis present

## 2022-10-12 DIAGNOSIS — Z993 Dependence on wheelchair: Secondary | ICD-10-CM

## 2022-10-12 DIAGNOSIS — Z7901 Long term (current) use of anticoagulants: Secondary | ICD-10-CM

## 2022-10-12 DIAGNOSIS — G40909 Epilepsy, unspecified, not intractable, without status epilepticus: Secondary | ICD-10-CM | POA: Diagnosis present

## 2022-10-12 DIAGNOSIS — J850 Gangrene and necrosis of lung: Secondary | ICD-10-CM | POA: Diagnosis present

## 2022-10-12 DIAGNOSIS — C3412 Malignant neoplasm of upper lobe, left bronchus or lung: Secondary | ICD-10-CM | POA: Diagnosis present

## 2022-10-12 DIAGNOSIS — C3490 Malignant neoplasm of unspecified part of unspecified bronchus or lung: Secondary | ICD-10-CM | POA: Diagnosis present

## 2022-10-12 DIAGNOSIS — Z841 Family history of disorders of kidney and ureter: Secondary | ICD-10-CM

## 2022-10-12 DIAGNOSIS — J440 Chronic obstructive pulmonary disease with acute lower respiratory infection: Secondary | ICD-10-CM | POA: Diagnosis present

## 2022-10-12 DIAGNOSIS — Z7902 Long term (current) use of antithrombotics/antiplatelets: Secondary | ICD-10-CM

## 2022-10-12 DIAGNOSIS — D638 Anemia in other chronic diseases classified elsewhere: Secondary | ICD-10-CM | POA: Diagnosis present

## 2022-10-12 DIAGNOSIS — C3491 Malignant neoplasm of unspecified part of right bronchus or lung: Secondary | ICD-10-CM | POA: Diagnosis present

## 2022-10-12 DIAGNOSIS — I5022 Chronic systolic (congestive) heart failure: Secondary | ICD-10-CM | POA: Diagnosis present

## 2022-10-12 DIAGNOSIS — Z923 Personal history of irradiation: Secondary | ICD-10-CM

## 2022-10-12 DIAGNOSIS — J984 Other disorders of lung: Secondary | ICD-10-CM | POA: Diagnosis not present

## 2022-10-12 DIAGNOSIS — J439 Emphysema, unspecified: Secondary | ICD-10-CM | POA: Diagnosis present

## 2022-10-12 DIAGNOSIS — Z7951 Long term (current) use of inhaled steroids: Secondary | ICD-10-CM

## 2022-10-12 DIAGNOSIS — E785 Hyperlipidemia, unspecified: Secondary | ICD-10-CM | POA: Diagnosis present

## 2022-10-12 DIAGNOSIS — I69351 Hemiplegia and hemiparesis following cerebral infarction affecting right dominant side: Secondary | ICD-10-CM

## 2022-10-12 DIAGNOSIS — I739 Peripheral vascular disease, unspecified: Secondary | ICD-10-CM | POA: Diagnosis present

## 2022-10-12 DIAGNOSIS — Z556 Problems related to health literacy: Secondary | ICD-10-CM

## 2022-10-12 DIAGNOSIS — K219 Gastro-esophageal reflux disease without esophagitis: Secondary | ICD-10-CM | POA: Diagnosis present

## 2022-10-12 DIAGNOSIS — Z9221 Personal history of antineoplastic chemotherapy: Secondary | ICD-10-CM

## 2022-10-12 DIAGNOSIS — I69320 Aphasia following cerebral infarction: Secondary | ICD-10-CM

## 2022-10-12 DIAGNOSIS — Z79899 Other long term (current) drug therapy: Secondary | ICD-10-CM

## 2022-10-12 DIAGNOSIS — F17210 Nicotine dependence, cigarettes, uncomplicated: Secondary | ICD-10-CM | POA: Diagnosis present

## 2022-10-12 DIAGNOSIS — Z86711 Personal history of pulmonary embolism: Secondary | ICD-10-CM | POA: Diagnosis present

## 2022-10-12 LAB — BASIC METABOLIC PANEL
Anion gap: 9 (ref 5–15)
BUN: 27 mg/dL — ABNORMAL HIGH (ref 8–23)
CO2: 27 mmol/L (ref 22–32)
Calcium: 9.3 mg/dL (ref 8.9–10.3)
Chloride: 104 mmol/L (ref 98–111)
Creatinine, Ser: 0.73 mg/dL (ref 0.61–1.24)
GFR, Estimated: >60 mL/min (ref >60)
Glucose, Bld: 101 mg/dL — ABNORMAL HIGH (ref 70–99)
Potassium: 4.1 mmol/L (ref 3.5–5.1)
Sodium: 140 mmol/L (ref 135–145)

## 2022-10-12 LAB — CBC
HCT: 36.4 % — ABNORMAL LOW (ref 39.0–52.0)
Hemoglobin: 11.1 g/dL — ABNORMAL LOW (ref 13.0–17.0)
MCH: 30.4 pg (ref 26.0–34.0)
MCHC: 30.5 g/dL (ref 30.0–36.0)
MCV: 99.7 fL (ref 80.0–100.0)
Platelets: 224 10*3/uL (ref 150–400)
RBC: 3.65 MIL/uL — ABNORMAL LOW (ref 4.22–5.81)
RDW: 15.1 % (ref 11.5–15.5)
WBC: 8 10*3/uL (ref 4.0–10.5)
nRBC: 0 % (ref 0.0–0.2)

## 2022-10-12 LAB — STREP PNEUMONIAE URINARY ANTIGEN: Strep Pneumo Urinary Antigen: NEGATIVE

## 2022-10-12 LAB — BRAIN NATRIURETIC PEPTIDE: B Natriuretic Peptide: 16.2 pg/mL (ref 0.0–100.0)

## 2022-10-12 LAB — HEPARIN LEVEL (UNFRACTIONATED): Heparin Unfractionated: 0.39 IU/mL (ref 0.30–0.70)

## 2022-10-12 LAB — TROPONIN I (HIGH SENSITIVITY)
Troponin I (High Sensitivity): 4 ng/L (ref <18)
Troponin I (High Sensitivity): 4 ng/L (ref <18)

## 2022-10-12 LAB — MRSA NEXT GEN BY PCR, NASAL: MRSA by PCR Next Gen: NOT DETECTED

## 2022-10-12 MED ORDER — HEPARIN (PORCINE) 25000 UT/250ML-% IV SOLN
1300.0000 [IU]/h | INTRAVENOUS | Status: DC
  Administered 2022-10-12 – 2022-10-13 (×2): 1300 [IU]/h via INTRAVENOUS
  Filled 2022-10-12 (×2): qty 250

## 2022-10-12 MED ORDER — ACETAMINOPHEN 650 MG RE SUPP: 650.0000 mg | Freq: Four times a day (QID) | RECTAL | Status: DC | PRN

## 2022-10-12 MED ORDER — SODIUM CHLORIDE 0.9 % IV SOLN
3.0000 g | Freq: Four times a day (QID) | INTRAVENOUS | Status: DC
  Administered 2022-10-12 – 2022-10-14 (×8): 3 g via INTRAVENOUS
  Filled 2022-10-12 (×10): qty 8

## 2022-10-12 MED ORDER — SODIUM CHLORIDE (PF) 0.9 % IJ SOLN
INTRAMUSCULAR | Status: AC
  Filled 2022-10-12: qty 50

## 2022-10-12 MED ORDER — SODIUM CHLORIDE 0.9 % IV SOLN
500.0000 mg | Freq: Once | INTRAVENOUS | Status: AC
  Administered 2022-10-12: 500 mg via INTRAVENOUS
  Filled 2022-10-12: qty 5

## 2022-10-12 MED ORDER — VANCOMYCIN HCL 1250 MG/250ML IV SOLN
1250.0000 mg | Freq: Once | INTRAVENOUS | Status: AC
  Administered 2022-10-12: 1250 mg via INTRAVENOUS
  Filled 2022-10-12: qty 250

## 2022-10-12 MED ORDER — ACETAMINOPHEN 325 MG PO TABS: 650.0000 mg | ORAL_TABLET | Freq: Four times a day (QID) | ORAL | Status: DC | PRN

## 2022-10-12 MED ORDER — IOHEXOL 350 MG/ML SOLN
100.0000 mL | Freq: Once | INTRAVENOUS | Status: AC | PRN
  Administered 2022-10-12: 100 mL via INTRAVENOUS

## 2022-10-12 MED ORDER — VANCOMYCIN HCL IN DEXTROSE 1-5 GM/200ML-% IV SOLN: 1000.0000 mg | Freq: Two times a day (BID) | INTRAVENOUS | Status: DC

## 2022-10-12 MED ORDER — SODIUM CHLORIDE 0.9 % IV SOLN
1.0000 g | Freq: Once | INTRAVENOUS | Status: AC
  Administered 2022-10-12: 1 g via INTRAVENOUS
  Filled 2022-10-12: qty 10

## 2022-10-12 MED ORDER — HEPARIN BOLUS VIA INFUSION
4000.0000 [IU] | Freq: Once | INTRAVENOUS | Status: AC
  Administered 2022-10-12: 4000 [IU] via INTRAVENOUS
  Filled 2022-10-12: qty 4000

## 2022-10-12 NOTE — ED Notes (Signed)
Patient transported to X-ray 

## 2022-10-12 NOTE — H&P (Addendum)
History and Physical    Patient: Todd Estrada XNA:[id number] DOB: May 05, 1961 DOA: 10/12/2022 DOS: the patient was seen and examined on 10/12/2022 PCP: [name], MD  Patient coming from: Home  Chief Complaint:  Chief Complaint  Patient presents with   Chest Pain   HPI: Todd Estrada is a 61 y.o. male with medical history significant of COPD, S4 SCC of lung, CVA w/ right side residuals, seizure d/o, HFrEF. Presenting w/ chest pain. History is from chart review as patient has expressive aphagia (he can answer yes/no) and there is no family present. Apparently he woke from sleep last night with complaints of chest pain, difficulty breathing and cough. He did not have any fever, N/V. Family became concerned and brought him to the ED for evaluation.     Review of Systems: As mentioned in the history of present illness. All other systems reviewed and are negative. Past Medical History:  Diagnosis Date   Asthma    COPD (chronic obstructive pulmonary disease) ([place])    Essential hypertension 08/19/2021   GERD (gastroesophageal reflux disease)    History of tracheostomy    03/09/22-04/11/22   HLD (hyperlipidemia)    Hypertension    Lung cancer ([facility])    PAD (peripheral artery disease) ([place])    Seizures ([place]) 06/02/2022   Stroke ([place]) 02/2022   Past Surgical History:  Procedure Laterality Date   BRONCHIAL BIOPSY  05/30/2022   Procedure: BRONCHIAL BIOPSIES;  Surgeon: Collene Gobble, MD;  Location: [facility] ENDOSCOPY;  Service: Pulmonary;;   BRONCHIAL BRUSHINGS  05/30/2022   Procedure: BRONCHIAL BRUSHINGS;  Surgeon: Collene Gobble, MD;  Location[facility] ENDOSCOPY;  Service: Pulmonary;;   BRONCHIAL NEEDLE ASPIRATION BIOPSY  05/30/2022   Procedure: BRONCHIAL NEEDLE ASPIRATION BIOPSIES;  Surgeon: Collene Gobble, MD;  Location[facility] ENDOSCOPY;  Service: Pulmonary;;   BRONCHIAL WASHINGS  05/30/2022   Procedure: BRONCHIAL WASHINGS;  Surgeon: Collene Gobble, MD;  Location: [facility] ENDOSCOPY;  Service:  Pulmonary;;   ESOPHAGOGASTRODUODENOSCOPY (EGD) WITH PROPOFOL N/A 03/11/2022   Procedure: ESOPHAGOGASTRODUODENOSCOPY (EGD) WITH PROPOFOL;  Surgeon: Georganna Skeans, MD;  Location: [place];  Service: General;  Laterality: N/A;   FIDUCIAL MARKER PLACEMENT  05/30/2022   Procedure: FIDUCIAL MARKER PLACEMENT;  Surgeon: Collene Gobble, MD;  Location: [facility] ENDOSCOPY;  Service: Pulmonary;;   IR ANGIO INTRA EXTRACRAN SEL COM CAROTID INNOMINATE UNI R MOD SED  03/02/2022   IR CT HEAD LTD  03/02/2022   IR PERCUTANEOUS ART THROMBECTOMY/INFUSION INTRACRANIAL INC DIAG ANGIO  03/02/2022   PEG PLACEMENT N/A 03/11/2022   Procedure: PERCUTANEOUS ENDOSCOPIC GASTROSTOMY (PEG) PLACEMENT;  Surgeon: Georganna Skeans, MD;  [facility] [place];  Service: General;  Laterality: N/A;   RADIOLOGY WITH ANESTHESIA N/A 03/02/2022   Procedure: IR WITH ANESTHESIA;  Surgeon: Luanne Bras, MD;  Location: [place];  Service: Radiology;  Laterality: N/A;   VIDEO BRONCHOSCOPY WITH RADIAL ENDOBRONCHIAL ULTRASOUND  05/30/2022   Procedure: VIDEO BRONCHOSCOPY WITH RADIAL ENDOBRONCHIAL ULTRASOUND;  Surgeon: Collene Gobble, MD;  Location: [place] ENDOSCOPY;  Service: Pulmonary;;   Social History:  reports that he quit smoking about 5 months ago. His smoking use included cigarettes. He has never been exposed to tobacco smoke. He has never used smokeless tobacco. He reports that he does not currently use alcohol. He reports that he does not use drugs.  No Known Allergies  Family History  Problem Relation Age of Onset   Throat cancer Mother    Liver cancer Father    Kidney failure Sister  Cancer - Lung Paternal Uncle     Prior to Admission medications   Medication Sig Start Date End Date Taking? Authorizing Provider  albuterol (PROVENTIL) (2.5 MG/3ML) 0.083% nebulizer solution Take 3 mLs (2.5 mg total) by nebulization every 4 (four) hours as needed for wheezing or shortness of breath. 08/16/22   Lacinda Axon, MD  atorvastatin  (LIPITOR) 40 MG tablet Take 1 tablet (40 mg total) by mouth daily. 05/25/22 05/25/23  Lacinda Axon, MD  budesonide (PULMICORT) 0.5 MG/2ML nebulizer solution USE 2 ML(0.5 MG) VIA NEBULIZER TWICE DAILY Patient taking differently: Take 0.5 mg by nebulization 2 (two) times daily. 06/27/22   Freddi Starr, MD  clopidogrel (PLAVIX) 75 MG tablet Take 1 tablet (75 mg total) by mouth daily. Okay to restart this medication on 05/31/2022 Patient taking differently: Take 75 mg by mouth daily. 06/28/22   Gaylan Gerold, DO  diclofenac Sodium (VOLTAREN) 1 % GEL Apply 2 g topically 4 (four) times daily. Patient not taking: Reported on 09/14/2022 08/30/22   Leigh Aurora, DO  folic acid (FOLVITE) 1 MG tablet Take 1 tablet (1 mg total) by mouth daily. 09/20/22   Gaylan Gerold, DO  ketotifen (ALLERGY EYE DROPS) 0.025 % ophthalmic solution Place 1 drop into the right eye 2 (two) times daily. Patient not taking: Reported on 09/14/2022 08/30/22   Leigh Aurora, DO  levETIRAcetam (KEPPRA) 500 MG tablet Take 1 tablet (500 mg total) by mouth 2 (two) times daily. 06/02/22   Marcial Pacas, MD  lidocaine (LIDODERM) 5 % Place 1 patch onto the skin daily. Remove & Discard patch within 12 hours or as directed by MD 08/31/22   Leigh Aurora, DO  midodrine (PROAMATINE) 2.5 MG tablet Take 1 tablet (2.5 mg total) by mouth 3 (three) times daily with meals. 08/30/22 02/26/23  Leigh Aurora, DO  Multiple Vitamin (MULTIVITAMIN WITH MINERALS) TABS tablet Take 1 tablet by mouth daily. 09/20/22   Gaylan Gerold, DO  nicotine (NICODERM CQ - DOSED IN MG/24 HOURS) 14 mg/24hr patch APPLY 1 PATCH(14 MG) TOPICALLY TO THE SKIN DAILY 09/20/22   Gaylan Gerold, DO  nicotine (NICODERM CQ - DOSED IN MG/24 HOURS) 14 mg/24hr patch APPLY 1 PATCH(14 MG) TOPICALLY TO THE SKIN DAILY 09/20/22   Gaylan Gerold, DO  ondansetron (ZOFRAN) 4 MG tablet Take 4 mg by mouth every 8 (eight) hours as needed for nausea or vomiting.    [provider]  STIOLTO RESPIMAT 2.5-2.5 MCG/ACT  AERS INHALE 2 PUFFS INTO THE LUNGS DAILY 06/17/22   Freddi Starr, MD    Physical Exam: Vitals:   10/12/22 0104 10/12/22 0111 10/12/22 0552 10/12/22 0745  BP: 99/80 97/64 126/87 109/72  Pulse: 99  83 83  Resp: 16  20 17   Temp: 97.9 F (36.6 C)  98.1 F (36.7 C)   TempSrc: Oral  Oral   SpO2: 100%  99% 99%   General: 61 y.o. chronically ill appearing male resting in bed Eyes: PERRL, normal sclera ENMT: Nares patent w/o discharge, orophaynx clear, dentition normal, ears w/o discharge/lesions/ulcers Neck: Supple, trachea midline Cardiovascular: RRR, +S1, S2, no m/g/r, equal pulses throughout Respiratory: decreased air movement at bases, scattered rhonchi w/ minimal exp wheeze b/l; normal WOB on RA GI: BS+, NDNT, no masses noted, no organomegaly noted MSK: No e/c/c Neuro: can answer yes/no otherwise aphasic, RUE weakness 2/5, RLE weakness 4-5/5.  Psyc: calm/cooperative  Data Reviewed:  Results for orders placed or performed during the hospital encounter of 10/12/22 (from the past 24 hour(s))  Basic metabolic panel     Status: Abnormal   Collection Time: 10/12/22  1:25 AM  Result Value Ref Range   Sodium 140 135 - 145 mmol/L   Potassium 4.1 3.5 - 5.1 mmol/L   Chloride 104 98 - 111 mmol/L   CO2 27 22 - 32 mmol/L   Glucose, Bld 101 (H) 70 - 99 mg/dL   BUN 27 (H) 8 - 23 mg/dL   Creatinine, Ser 0.73 0.61 - 1.24 mg/dL   Calcium 9.3 8.9 - 10.3 mg/dL   GFR, Estimated >60 >60 mL/min   Anion gap 9 5 - 15  CBC     Status: Abnormal   Collection Time: 10/12/22  1:25 AM  Result Value Ref Range   WBC 8.0 4.0 - 10.5 K/uL   RBC 3.65 (L) 4.22 - 5.81 MIL/uL   Hemoglobin 11.1 (L) 13.0 - 17.0 g/dL   HCT 36.4 (L) 39.0 - 52.0 %   MCV 99.7 80.0 - 100.0 fL   MCH 30.4 26.0 - 34.0 pg   MCHC 30.5 30.0 - 36.0 g/dL   RDW 15.1 11.5 - 15.5 %   Platelets 224 150 - 400 K/uL   nRBC 0.0 0.0 - 0.2 %  Troponin I (High Sensitivity)     Status: None   Collection Time: 10/12/22  1:25 AM  Result Value  Ref Range   Troponin I (High Sensitivity) 4 <18 ng/L  Troponin I (High Sensitivity)     Status: None   Collection Time: 10/12/22  4:25 AM  Result Value Ref Range   Troponin I (High Sensitivity) 4 <18 ng/L   CXR: 1. Stable left perihilar and left suprahilar opacities which correspond to areas of suspected malignancy seen on the prior chest CT. 2. Stable, faint right mid lung opacity with a small adjacent radiopaque marker.  CTA Chest: 1. Examination is positive for acute pulmonary embolus which originates from the left main pulmonary artery and extends into a anterior segmental branch of the left upper lobe pulmonary artery. No signs of right heart strain. 2. Persistence of cavitary process within the anteromedial right upper lobe now extends into the apex measuring 6.7 x 3.5 x 3.2 cm. Previously this area measured 6.5 x 4.5 x 3.3 cm. More centrally, there is a stable appearing solid-appearing nodule measuring 2.7 x 2.0 cm. Cannot exclude underlying malignancy. Recommend referral to pulmonary medicine or thoracic surgery for further management. 3. Within the right upper lobe there is a nodule containing fiducial marker which measures 2.2 x 1.4 cm. Previously 2.5 x 1.4 cm. Also concerning for malignancy. 4. Coronary artery calcifications. 5. Aortic Atherosclerosis (ICD10-I70.0) and Emphysema (ICD10-J43.9).   Assessment and Plan: Acute pulmonary embolism     - place in obs, tele     - continue heparin gtt     - need to speak with caregiver RE: oral AC     - check echo  SCC of right and left lung Cavitary lung lesion     - follows w/ Dr Julien Nordmann and Dr. 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 Loanne Drilling)  Family Communication: attempted call to family Livia Snellen); received VM x 2  Severity of Illness: The appropriate patient status for this patient is OBSERVATION. Observation status is judged to be reasonable and necessary in order to provide the required intensity of service to ensure the patient's safety. The patient's presenting symptoms, physical exam findings, and initial radiographic and laboratory data in the context of their medical condition is felt to place them at decreased risk for further clinical deterioration. Furthermore, it is anticipated that the patient will be medically stable for discharge from the hospital within 2 midnights of admission.   Author: Jonnie Finner, DO 10/12/2022 7:59 AM  For on call review [email or web address].

## 2022-10-12 NOTE — ED Notes (Signed)
Pt caregiver states he has to leave, he is available at the contact number listed for questions/dispostion

## 2022-10-12 NOTE — Consult Note (Signed)
NAME:  Todd Estrada, MRN:  [id number], DOB:  1961/10/10, LOS: 0 ADMISSION DATE:  10/12/2022, CONSULTATION DATE:  10/12/22 REFERRING MD:  Dr. 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     Follows in our clinic with Dr. Erin Fulling, last seen 04/2022.  Underwent bronchoscopy with biopsy on 05/30/22 for bilateral lung nodules which confirmed squamous cell lung cancer of LUL, IIb with noted left hilar adenopathy and synchronous stage Ia of RUL vs possible stage IV SCC of LUL cancer with metastasis to RUL.  Followed by Dr. Julien Nordmann and Dr. Lisbeth Renshaw, and started on chemoradiation with carboplatin and paclitaxel, started 7/17, last infusion noted 8/21 which appeared to be this sixth cycle.  Finished radiation to LUL on 8/30 and started SBRT therapy to RUL on 9/8.  Recent admits 8/25 to 8/29 with LUL PNA concerning for necrotizing infection and RLL pna treated with additional 5 days of augmentin at discharge.  Then again hospitalized 9/26 to 9/29 for CAP felt to either cavitary pneumonia or progression of mass, noted to have resolution of fever, leukocytosis and cough after 3 days of CAP antibiotics discharged home on 4 additional days of cefdinir.    Given his residual expressive aphasia, patient is able to communicate by yes or no.  Currently he denies any cough, shortness of breath, chest pain, or any pain.  Called and spoke with his cousin, [name] in whom he lives with and provides his care.  Reports woke up last night around midnight, hollering out for him complaining of right chest pain, right arm and leg pain.  Reports chronic dry cough and no recent changes to that or recent complaints of shortness of breath.  He is wheelchair  bound at home.  [name] does relay that speech therapy works with him at home and recently they recommended evaluating him to make sure he was on the correct dysphagia diet in concerns of aspiration.    On workup, he has been afebrile, hemodynamically stable, and normoxia without any O2 requirement.  WBC 8.  He was found to acute PE of left main pulmonary artery extending into anterior segmental without evidence of right heart strain.  Imaging also concerning for enlarging of cavitation of left upper lung from prior imaging.  He was admitted to [facility] and started on heparin gtt and empiric antibiotics.  Pulmonary consulted for further recommendations of cavitary process.   Pertinent  Medical History  Former tobacco abuse (1ppd for 47yrs, quit 05/2022), emphysema, stage IV SCC, HFrEF (EF 40%), HTN, PAD, CVA with residual right sided deficits and expressive aphasia, dysphagia, seizures, GERD, HLD, Cdiff, anemia of chronic disease Prior trach 03/08/22 to 04/11/22 after CVA.   Significant Hospital Events: Including procedures, antibiotic start and stop dates in addition to other pertinent events     Interim History / Subjective:   Objective   Blood pressure 108/81, pulse 78, temperature 98.1 F (36.7 C), temperature source Oral, resp. rate 17, weight 72.7 kg, SpO2 100 %.        Intake/Output Summary (Last 24 hours) at 10/12/2022 1141 Last data filed at 10/12/2022 1008 Gross per 24 hour  Intake 100 ml  Output --  Net 100 ml  Filed Weights   10/12/22 0552 10/12/22 0900  Weight: 72.6 kg 72.7 kg    Examination: General:  chronically ill appearing elderly male lying in bed in NAD HEENT: MM pink/dry, scar noted from prior trach Neuro:  Awake, speech slurred if not saying yes/ no> seems appropriate, R sided weakness/ some gross movement but not to gravity CV: rr, no murmur PULM:  non labored, diminished throughout, faint scattered rhonchi on left posteriorly, no wheeze GI: soft, bs+ Extremities:  warm/dry, trace edema  Skin: no rashes, posterior not examined   Resolved Hospital Problem list    Assessment & Plan:   Necrotizing PNA of LUL - likely post-obstructive from LUL mass but can not rule out lymphangitic spread vs new mass.  Area is improved from imaging in August  however slightly bigger from September imaging, previously 6.5 by 4.5 x 3.3cm now measuring 6.7 by 3.5 x 3.2 cm.  Noted clinical improvement after antibiotics both times arguing for infectious process.  He does not have change from his baseline dry cough, fever, or any leukocytosis.  Last treated in September with total of 7 days of abx, but now ill need a longer course, at minimum of 3 weeks of augmentin after discharge.  Started on azithromycin, ceftriaxone, and vanc.  Currently on unasyn.  Ok to stop vanc.  MRSA PCR neg.  Strep urine neg.  Legionella pending.  - currently no oxygen requirements - will need f/u in pulmonary clinic in 2-3 weeks with Dr. Erin Fulling or any APP for follow-up and repeat imaging to look resolution/ r/o additional or spread of malignancy.  Our office should call to schedule appointment.     SCC of LUL IIB and SCC of RUL Ia - s/p chemoradiation, last infusion of carboplatin and paclitaxel 8/21 with completion of radiation to LUL since started SBRT to RUL on 9/8 w/ Dr. Lisbeth Renshaw. - needs f/u with Dr. Julien Nordmann   Acute PE without evidence of right heart strain - trop hs/ BNP neg - heparin gtt with plans to transition to DOAC, echo ordered    Emphysema - brovanna/ incruse in place of home stiolto respimat, prn albuterol   Dysphagia - SLP follows at home.  Caregiver/ cousin, [name] asking for recommendations for specific dysphagia diet to prevent aspiration.     Remainder per primary team.   Pulmonary will signoff at this time.  Nothing further to add.  Please call us back if we can be of any further assistance.   Best Practice (right click and "Reselect all SmartList Selections" daily)  Per  TRH.  Labs   CBC: Recent Labs  Lab 10/12/22 0125  WBC 8.0  HGB 11.1*  HCT 36.4*  MCV 99.7  PLT 324    Basic Metabolic Panel: Recent Labs  Lab 10/12/22 0125  NA 140  K 4.1  CL 104  CO2 27  GLUCOSE 101*  BUN 27*  CREATININE 0.73  CALCIUM 9.3   GFR: Estimated Creatinine Clearance: 98.4 mL/min (by C-G formula based on SCr of 0.73 mg/dL). Recent Labs  Lab 10/12/22 0125  WBC 8.0    Liver Function Tests: No results for input(s): "AST", "ALT", "ALKPHOS", "BILITOT", "PROT", "ALBUMIN" in the last 168 hours. No results for input(s): "LIPASE", "AMYLASE" in the last 168 hours. No results for input(s): "AMMONIA" in the last 168 hours.  ABG    Component Value Date/Time   PHART 7.49 (H) 03/28/2022 1205   PCO2ART 41 03/28/2022 1205   PO2ART 87 03/28/2022 1205   HCO3  31.2 (H) 03/28/2022 1205   TCO2 31 09/25/2022 1443   O2SAT 99.4 03/28/2022 1205     Coagulation Profile: No results for input(s): "INR", "PROTIME" in the last 168 hours.  Cardiac Enzymes: No results for input(s): "CKTOTAL", "CKMB", "CKMBINDEX", "TROPONINI" in the last 168 hours.  HbA1C: Hgb A1c MFr Bld  Date/Time Value Ref Range Status  03/03/2022 04:30 AM 5.4 4.8 - 5.6 % Final    Comment:    (NOTE)         Prediabetes: 5.7 - 6.4         Diabetes: >6.4         Glycemic control for adults with diabetes: <7.0   08/19/2021 07:49 AM 5.5 4.8 - 5.6 % Final    Comment:    (NOTE) Pre diabetes:          5.7%-6.4%  Diabetes:              >6.4%  Glycemic control for   <7.0% adults with diabetes     CBG: No results for input(s): "GLUCAP" in the last 168 hours.  Review of Systems:   All review of symptoms negative per patient.   Past Medical History:  He,  has a past medical history of Asthma, COPD (chronic obstructive pulmonary disease) ([place]), Essential hypertension (08/19/2021), GERD (gastroesophageal reflux disease), History of tracheostomy, HLD (hyperlipidemia), Hypertension, Lung cancer ([place]),  PAD (peripheral artery disease) ([place]), Seizures ([place]) (06/02/2022), and Stroke ([place]) (02/2022).   Surgical History:   Past Surgical History:  Procedure Laterality Date   BRONCHIAL BIOPSY  05/30/2022   Procedure: BRONCHIAL BIOPSIES;  Surgeon: Collene Gobble, MD;  Location: [facility] ENDOSCOPY;  Service: Pulmonary;;   BRONCHIAL BRUSHINGS  05/30/2022   Procedure: BRONCHIAL BRUSHINGS;  Surgeon: Collene Gobble, MD;  Location: [facility] ENDOSCOPY;  Service: Pulmonary;;   BRONCHIAL NEEDLE ASPIRATION BIOPSY  05/30/2022   Procedure: BRONCHIAL NEEDLE ASPIRATION BIOPSIES;  Surgeon: Collene Gobble, MD;  Location: [place];  Service: Pulmonary;;   BRONCHIAL WASHINGS  05/30/2022   Procedure: BRONCHIAL WASHINGS;  Surgeon: Collene Gobble, MD;  Location: [facility] ENDOSCOPY;  Service: Pulmonary;;   ESOPHAGOGASTRODUODENOSCOPY (EGD) WITH PROPOFOL N/A 03/11/2022   Procedure: ESOPHAGOGASTRODUODENOSCOPY (EGD) WITH PROPOFOL;  Surgeon: Georganna Skeans, MD;  Location: [place];  Service: General;  Laterality: N/A;   FIDUCIAL MARKER PLACEMENT  05/30/2022   Procedure: FIDUCIAL MARKER PLACEMENT;  Surgeon: Collene Gobble, MD;  Location: [facility] ENDOSCOPY;  Service: Pulmonary;;   IR ANGIO INTRA EXTRACRAN SEL COM CAROTID INNOMINATE UNI R MOD SED  03/02/2022   IR CT HEAD LTD  03/02/2022   IR PERCUTANEOUS ART THROMBECTOMY/INFUSION INTRACRANIAL INC DIAG ANGIO  03/02/2022   PEG PLACEMENT N/A 03/11/2022   Procedure: PERCUTANEOUS ENDOSCOPIC GASTROSTOMY (PEG) PLACEMENT;  Surgeon: Georganna Skeans, MD;  Location: [place];  Service: General;  Laterality: N/A;   RADIOLOGY WITH ANESTHESIA N/A 03/02/2022   Procedure: IR WITH ANESTHESIA;  Surgeon: Luanne Bras, MD;  Location: [place];  Service: Radiology;  Laterality: N/A;   VIDEO BRONCHOSCOPY WITH RADIAL ENDOBRONCHIAL ULTRASOUND  05/30/2022   Procedure: VIDEO BRONCHOSCOPY WITH RADIAL ENDOBRONCHIAL ULTRASOUND;  Surgeon: Collene Gobble, MD;  Location: [place] ENDOSCOPY;  Service: Pulmonary;;     Social  History:   reports that he quit smoking about 5 months ago. His smoking use included cigarettes. He has never been exposed to tobacco smoke. He has never used smokeless tobacco. He reports that he does not currently use alcohol. He reports that he does not use drugs.  Family History:  His family history includes Cancer - Lung in his paternal uncle; Kidney failure in his sister; Liver cancer in his father; Throat cancer in his mother.   Allergies No Known Allergies   Home Medications  Prior to Admission medications   Medication Sig Start Date End Date Taking? Authorizing Provider  albuterol (PROVENTIL) (2.5 MG/3ML) 0.083% nebulizer solution Take 3 mLs (2.5 mg total) by nebulization every 4 (four) hours as needed for wheezing or shortness of breath. 08/16/22  Yes Amponsah, Charisse March, MD  atorvastatin (LIPITOR) 40 MG tablet Take 1 tablet (40 mg total) by mouth daily. 05/25/22 05/25/23 Yes Amponsah, Charisse March, MD  budesonide (PULMICORT) 0.5 MG/2ML nebulizer solution USE 2 ML(0.5 MG) VIA NEBULIZER TWICE DAILY Patient taking differently: Take 0.5 mg by nebulization 2 (two) times daily. 06/27/22  Yes Freddi Starr, MD  clopidogrel (PLAVIX) 75 MG tablet Take 1 tablet (75 mg total) by mouth daily. Okay to restart this medication on 05/31/2022 Patient taking differently: Take 75 mg by mouth daily. 06/28/22  Yes Gaylan Gerold, DO  folic acid (FOLVITE) 1 MG tablet Take 1 tablet (1 mg total) by mouth daily. 09/20/22  Yes Gaylan Gerold, DO  levETIRAcetam (KEPPRA) 500 MG tablet Take 1 tablet (500 mg total) by mouth 2 (two) times daily. 06/02/22  Yes Marcial Pacas, MD  midodrine (PROAMATINE) 2.5 MG tablet Take 1 tablet (2.5 mg total) by mouth 3 (three) times daily with meals. 08/30/22 02/26/23 Yes Leigh Aurora, DO  Multiple Vitamin (MULTIVITAMIN WITH MINERALS) TABS tablet Take 1 tablet by mouth daily. 09/20/22  Yes Gaylan Gerold, DO  nicotine (NICODERM CQ - DOSED IN MG/24 HOURS) 14 mg/24hr patch APPLY 1 PATCH(14 MG)  TOPICALLY TO THE SKIN DAILY Patient taking differently: Place 14 mg onto the skin daily. 09/20/22  Yes Gaylan Gerold, DO  ondansetron (ZOFRAN) 4 MG tablet Take 4 mg by mouth every 8 (eight) hours as needed for nausea or vomiting.   Yes [provider]  STIOLTO RESPIMAT 2.5-2.5 MCG/ACT AERS INHALE 2 PUFFS INTO THE LUNGS DAILY 06/17/22  Yes Freddi Starr, MD  diclofenac Sodium (VOLTAREN) 1 % GEL Apply 2 g topically 4 (four) times daily. Patient not taking: Reported on 09/14/2022 08/30/22   Leigh Aurora, DO  ketotifen (ALLERGY EYE DROPS) 0.025 % ophthalmic solution Place 1 drop into the right eye 2 (two) times daily. Patient not taking: Reported on 09/14/2022 08/30/22   Leigh Aurora, DO  lidocaine (LIDODERM) 5 % Place 1 patch onto the skin daily. Remove & Discard patch within 12 hours or as directed by MD Patient not taking: Reported on 10/12/2022 08/31/22   Leigh Aurora, DO     Critical care time: n/a      Kennieth Rad, MSN, AG-ACNP-BC [facility] 10/12/2022, 11:41 AM  See Amion for pager If no response to pager, please call PCCM consult pager After 7:00 pm call Elink

## 2022-10-12 NOTE — Progress Notes (Signed)
ANTICOAGULATION CONSULT NOTE - Initial Consult  Pharmacy Consult for Heparin Indication: pulmonary embolus  No Known Allergies  Patient Measurements:   Heparin Dosing Weight: 72.7kg (from Sept 2023)  Vital Signs: Temp: 98.1 F (36.7 C) (10/25 0552) Temp Source: Oral (10/25 0552) BP: 126/87 (10/25 0552) Pulse Rate: 83 (10/25 0552)  Labs: Recent Labs    10/12/22 0125 10/12/22 0425  HGB 11.1*  --   HCT 36.4*  --   PLT 224  --   CREATININE 0.73  --   TROPONINIHS 4 4    CrCl cannot be calculated (Unknown ideal weight.).   Medical History: Past Medical History:  Diagnosis Date   Asthma    COPD (chronic obstructive pulmonary disease) ([place])    Essential hypertension 08/19/2021   GERD (gastroesophageal reflux disease)    History of tracheostomy    03/09/22-04/11/22   HLD (hyperlipidemia)    Hypertension    Lung cancer ([facility])    PAD (peripheral artery disease) ([facility])    Seizures ([place]) 06/02/2022   Stroke ([place]) 02/2022    Assessment: Active Problem(s): pain in his right chest that goes down the side of his right body into his leg.  - 10/25 CT: positive for acute pulmonary embolus which originates from the left main pulmonary artery and extends into a anterior segmental branch of the left upper lobe pulmonary artery. No signs of right heart strain.  AC/Heme: New PE. No anticoag PTA. Baseline Hgb 11.1 Plts 224.  Goal of Therapy:  Heparin level 0.3-0.7 units/ml Monitor platelets by anticoagulation protocol: Yes   Plan:  Heparin 4000 unit IV bolus Heparin infusion 1300 units/hr Check heparin level in 8 hrs. Daily HL and CBC   Maeve Debord S. Alford Highland, PharmD, BCPS Clinical Staff Pharmacist [email or web address] Alford Highland, [employer] 10/12/2022,7:29 AM

## 2022-10-12 NOTE — ED Triage Notes (Signed)
Pt caregiver reporting the pt woke up from his sleep c/o pain in his right chest that goes down the side of his right body into his leg.

## 2022-10-12 NOTE — ED Notes (Signed)
Save blue tube in main lab °

## 2022-10-12 NOTE — Progress Notes (Signed)
[place] for Heparin Indication: pulmonary embolus  No Known Allergies  Patient Measurements: Height: 6\' 1"  (185.4 cm) Weight: 72.6 kg (160 lb) IBW/kg (Calculated) : 79.9 Heparin Dosing Weight: 72.7kg (from Sept 2023)  Vital Signs: Temp: 98.2 F (36.8 C) (10/25 1928) Temp Source: Oral (10/25 1928) BP: 113/94 (10/25 1927) Pulse Rate: 88 (10/25 1928)  Labs: Recent Labs    10/12/22 0125 10/12/22 0425 10/12/22 1739  HGB 11.1*  --   --   HCT 36.4*  --   --   PLT 224  --   --   HEPARINUNFRC  --   --  0.39  CREATININE 0.73  --   --   TROPONINIHS 4 4  --      Estimated Creatinine Clearance: 98.3 mL/min (by C-G formula based on SCr of 0.73 mg/dL).  Assessment: 39 yoM with PMH CVA, seizure, COPD, stage 4 lung CA, presents with CP, dyspnea. Found to have new acute L PE without RH strain, and Pharmacy consulted to dose IV heparin.  Goal of Therapy:  Heparin level 0.3-0.7 units/ml Monitor platelets by anticoagulation protocol: Yes   Plan:  Continue heparin infusion at 1300 units/hr Daily HL and CBC Monitor for s/s of bleeding or worsening thrombosis F/u plans for long-term anticoagulation  Reuel Boom, PharmD, BCPS [phone] 10/12/2022, 8:03 PM

## 2022-10-12 NOTE — Progress Notes (Signed)
Pharmacy Antibiotic Note  Todd Estrada is a 61 y.o. male admitted on 10/12/2022 with cavitary pneumonia.  Pharmacy has been consulted for vancomycin and unasyn dosing. Noted last weight in September 2023 was 72.7 kg. Scr 0.73 with estimated CrCl ~ 98 ml/min.   Plan: Unasyn 3 gm IV Q 6 hours  Vancomycin 1250 mg X 1 then 1000 mg every 12 hours  Predicted AUC 443 with SCr 0.8  Monitor renal function  F/U MRSA PCR, culture data for ability to narrow    Weight: 72.7 kg (160 lb 4.4 oz)  Temp (24hrs), Avg:98 F (36.7 C), Min:97.9 F (36.6 C), Max:98.1 F (36.7 C)  Recent Labs  Lab 10/12/22 0125  WBC 8.0  CREATININE 0.73    Estimated Creatinine Clearance: 98.4 mL/min (by C-G formula based on SCr of 0.73 mg/dL).    No Known Allergies  Antimicrobials this admission: CTX/Azith x 1 10/25 10/25 Unasyn > 10/25 Vanc>   Microbiology results: 10/25  MRSA PCR: pending   Thank you for allowing pharmacy to be a part of this patient's care.  Jimmy Footman, PharmD, BCPS, BCIDP Infectious Diseases Clinical Pharmacist Phone: [phone] 10/12/2022 9:23 AM

## 2022-10-12 NOTE — ED Provider Notes (Signed)
[facility] DEPT Provider Note   CSN: [id number] Arrival date & time: 10/12/22  0050     History  Chief Complaint  Patient presents with   Chest Pain    Todd Estrada is a 60 y.o. male.  Patient as above with significant medical history as below, including COPD, hypertension, lung cancer SCC, PAD, CVA who presents to the ED with complaint of chest pain.  Per caretaker patient woke suddenly prior to arrival complaining of chest pain coughing, difficulty breathing.  This has improved since the onset.  Patient difficulty expressing himself secondary to speech difficulties.  Denies similar symptoms in the past.  No fevers or chills, no nausea or vomiting.  No recent falls or head injuries.  Denies recent medication changes.  Denies change to bowel or bladder function.  Pt with speech abnormality at baseline, can answer yes/no and express himself somewhat but has sig difficulty. Prior CVA  Of note pt was admitted 9/26 with sepsis likely 2/2 pneumonia, cavitary pna. Seen by pulm during that admission; recommend o/p f/u with oncology per hospitalist notes.  Past Medical History:  Diagnosis Date   Asthma    COPD (chronic obstructive pulmonary disease) ([place])    Essential hypertension 08/19/2021   GERD (gastroesophageal reflux disease)    History of tracheostomy    03/09/22-04/11/22   HLD (hyperlipidemia)    Hypertension    Lung cancer (HCC)    PAD (peripheral artery disease) ([place])    Seizures ([place]) 06/02/2022   Stroke ([place]) 02/2022    Past Surgical History:  Procedure Laterality Date   BRONCHIAL BIOPSY  05/30/2022   Procedure: BRONCHIAL BIOPSIES;  Surgeon: Collene Gobble, MD;  Location: [facility] ENDOSCOPY;  Service: Pulmonary;;   BRONCHIAL BRUSHINGS  05/30/2022   Procedure: BRONCHIAL BRUSHINGS;  Surgeon: Collene Gobble, MD;  Location: [facility] ENDOSCOPY;  Service: Pulmonary;;   BRONCHIAL NEEDLE ASPIRATION BIOPSY  05/30/2022   Procedure: BRONCHIAL NEEDLE ASPIRATION  BIOPSIES;  Surgeon: Collene Gobble, MD;  Location: [facility] ENDOSCOPY;  Service: Pulmonary;;   BRONCHIAL WASHINGS  05/30/2022   Procedure: BRONCHIAL WASHINGS;  Surgeon: Collene Gobble, MD;  Location: [facility] ENDOSCOPY;  Service: Pulmonary;;   ESOPHAGOGASTRODUODENOSCOPY (EGD) WITH PROPOFOL N/A 03/11/2022   Procedure: ESOPHAGOGASTRODUODENOSCOPY (EGD) WITH PROPOFOL;  Surgeon: Georganna Skeans, MD;  Location: [facility];  Service: General;  Laterality: N/A;   FIDUCIAL MARKER PLACEMENT  05/30/2022   Procedure: FIDUCIAL MARKER PLACEMENT;  Surgeon: Collene Gobble, MD;  Location: [facility] ENDOSCOPY;  Service: Pulmonary;;   IR ANGIO INTRA EXTRACRAN SEL COM CAROTID INNOMINATE UNI R MOD SED  03/02/2022   IR CT HEAD LTD  03/02/2022   IR PERCUTANEOUS ART THROMBECTOMY/INFUSION INTRACRANIAL INC DIAG ANGIO  03/02/2022   PEG PLACEMENT N/A 03/11/2022   Procedure: PERCUTANEOUS ENDOSCOPIC GASTROSTOMY (PEG) PLACEMENT;  Surgeon: Georganna Skeans, MD;  Location: [facility];  Service: General;  Laterality: N/A;   RADIOLOGY WITH ANESTHESIA N/A 03/02/2022   Procedure: IR WITH ANESTHESIA;  Surgeon: Luanne Bras, MD;  Location: [facility];  Service: Radiology;  Laterality: N/A;   VIDEO BRONCHOSCOPY WITH RADIAL ENDOBRONCHIAL ULTRASOUND  05/30/2022   Procedure: VIDEO BRONCHOSCOPY WITH RADIAL ENDOBRONCHIAL ULTRASOUND;  Surgeon: Collene Gobble, MD;  Location: [facility] ENDOSCOPY;  Service: Pulmonary;;     The history is provided by the patient. The history is limited by the condition of the patient. No language interpreter was used.  Chest Pain Associated symptoms: cough and shortness of breath   Associated symptoms: no abdominal pain, no dysphagia,  no fever, no headache, no nausea, no palpitations and no vomiting        Home Medications Prior to Admission medications   Medication Sig Start Date End Date Taking? Authorizing Provider  albuterol (PROVENTIL) (2.5 MG/3ML) 0.083% nebulizer solution Take 3 mLs (2.5 mg total) by nebulization every 4  (four) hours as needed for wheezing or shortness of breath. 08/16/22   Lacinda Axon, MD  atorvastatin (LIPITOR) 40 MG tablet Take 1 tablet (40 mg total) by mouth daily. 05/25/22 05/25/23  Lacinda Axon, MD  budesonide (PULMICORT) 0.5 MG/2ML nebulizer solution USE 2 ML(0.5 MG) VIA NEBULIZER TWICE DAILY Patient taking differently: Take 0.5 mg by nebulization 2 (two) times daily. 06/27/22   Freddi Starr, MD  cefdinir (OMNICEF) 300 MG capsule Take 1 capsule (300 mg total) by mouth 2 (two) times daily. 09/16/22   Rick Duff, MD  clopidogrel (PLAVIX) 75 MG tablet Take 1 tablet (75 mg total) by mouth daily. Okay to restart this medication on 05/31/2022 Patient taking differently: Take 75 mg by mouth daily. 06/28/22   Gaylan Gerold, DO  diclofenac Sodium (VOLTAREN) 1 % GEL Apply 2 g topically 4 (four) times daily. Patient not taking: Reported on 09/14/2022 08/30/22   Leigh Aurora, DO  folic acid (FOLVITE) 1 MG tablet Take 1 tablet (1 mg total) by mouth daily. 09/20/22   Gaylan Gerold, DO  ketotifen (ALLERGY EYE DROPS) 0.025 % ophthalmic solution Place 1 drop into the right eye 2 (two) times daily. Patient not taking: Reported on 09/14/2022 08/30/22   Leigh Aurora, DO  levETIRAcetam (KEPPRA) 500 MG tablet Take 1 tablet (500 mg total) by mouth 2 (two) times daily. 06/02/22   Marcial Pacas, MD  lidocaine (LIDODERM) 5 % Place 1 patch onto the skin daily. Remove & Discard patch within 12 hours or as directed by MD 08/31/22   Leigh Aurora, DO  midodrine (PROAMATINE) 2.5 MG tablet Take 1 tablet (2.5 mg total) by mouth 3 (three) times daily with meals. 08/30/22 02/26/23  Leigh Aurora, DO  Multiple Vitamin (MULTIVITAMIN WITH MINERALS) TABS tablet Take 1 tablet by mouth daily. 09/20/22   Gaylan Gerold, DO  nicotine (NICODERM CQ - DOSED IN MG/24 HOURS) 14 mg/24hr patch APPLY 1 PATCH(14 MG) TOPICALLY TO THE SKIN DAILY 09/20/22   Gaylan Gerold, DO  nicotine (NICODERM CQ - DOSED IN MG/24 HOURS) 14 mg/24hr patch APPLY 1  PATCH(14 MG) TOPICALLY TO THE SKIN DAILY 09/20/22   Gaylan Gerold, DO  ondansetron (ZOFRAN) 4 MG tablet Take 4 mg by mouth every 8 (eight) hours as needed for nausea or vomiting.    [provider]  STIOLTO RESPIMAT 2.5-2.5 MCG/ACT AERS INHALE 2 PUFFS INTO THE LUNGS DAILY 06/17/22   Freddi Starr, MD      Allergies    Patient has no known allergies.    Review of Systems   Review of Systems  Constitutional:  Negative for chills and fever.  HENT:  Negative for facial swelling and trouble swallowing.   Eyes:  Negative for photophobia and visual disturbance.  Respiratory:  Positive for cough and shortness of breath.   Cardiovascular:  Positive for chest pain. Negative for palpitations.  Gastrointestinal:  Negative for abdominal pain, anal bleeding, nausea and vomiting.  Endocrine: Negative for polydipsia and polyuria.  Genitourinary:  Negative for difficulty urinating and hematuria.  Musculoskeletal:  Negative for gait problem and joint swelling.  Skin:  Negative for pallor and rash.  Neurological:  Negative for syncope and headaches.  Psychiatric/Behavioral:  Negative for agitation and confusion.     Physical Exam Updated Vital Signs BP 109/72   Pulse 83   Temp 98.1 F (36.7 C) (Oral)   Resp 17   SpO2 99%  Physical Exam Vitals and nursing note reviewed.  Constitutional:      General: He is not in acute distress.    Appearance: He is well-developed.  HENT:     Head: Normocephalic and atraumatic.     Right Ear: External ear normal.     Left Ear: External ear normal.     Mouth/Throat:     Mouth: Mucous membranes are moist.  Eyes:     General: No scleral icterus. Cardiovascular:     Rate and Rhythm: Normal rate and regular rhythm.     Pulses: Normal pulses.     Heart sounds: Normal heart sounds.  Pulmonary:     Effort: Pulmonary effort is normal. No respiratory distress.     Breath sounds: Decreased air movement present. Decreased breath sounds and wheezing  present.     Comments: Adventitious breath sounds upper lobes bilateral Trace wheezing bilateral Abdominal:     General: Abdomen is flat.     Palpations: Abdomen is soft.     Tenderness: There is no abdominal tenderness.  Musculoskeletal:        General: Normal range of motion.     Cervical back: Normal range of motion.     Right lower leg: No edema.     Left lower leg: No edema.  Skin:    General: Skin is warm and dry.     Capillary Refill: Capillary refill takes less than 2 seconds.  Neurological:     Mental Status: He is alert.     GCS: GCS eye subscore is 4. GCS verbal subscore is 5. GCS motor subscore is 6.     Comments: Difficulty with speaking but is able to communicate, does not appear to be confused. Speech somewhat garbled at times but can respond yes/no to questions.   Psychiatric:        Mood and Affect: Mood normal.        Behavior: Behavior normal.     ED Results / Procedures / Treatments   Labs (all labs ordered are listed, but only abnormal results are displayed) Labs Reviewed  BASIC METABOLIC PANEL - Abnormal; Notable for the following components:      Result Value   Glucose, Bld 101 (*)    BUN 27 (*)    All other components within normal limits  CBC - Abnormal; Notable for the following components:   RBC 3.65 (*)    Hemoglobin 11.1 (*)    HCT 36.4 (*)    All other components within normal limits  EXPECTORATED SPUTUM ASSESSMENT W GRAM STAIN, RFLX TO RESP C  HEPARIN LEVEL (UNFRACTIONATED)  STREP PNEUMONIAE URINARY ANTIGEN  LEGIONELLA PNEUMOPHILA SEROGP 1 UR AG  TROPONIN I (HIGH SENSITIVITY)  TROPONIN I (HIGH SENSITIVITY)    EKG EKG Interpretation  Date/Time:  Wednesday October 12 2022 01:29:58 EDT Ventricular Rate:  101 PR Interval:  143 QRS Duration: 90 QT Interval:  344 QTC Calculation: 446 R Axis:   85 Text Interpretation: Sinus tachycardia Multiple ventricular premature complexes Borderline right axis deviation Nonspecific T abnrm,  anterolateral leads similar to prior no stemi Confirmed by Wynona Dove ([id number]) on 10/12/2022 7:43:59 AM  Radiology CT Angio Chest PE W and/or Wo Contrast  Result Date: 10/12/2022 CLINICAL DATA:  Evaluate for pulmonary embolus.  Chest pain. * Tracking Code: BO * EXAM: CT ANGIOGRAPHY CHEST WITH CONTRAST TECHNIQUE: Multidetector CT imaging of the chest was performed using the standard protocol during bolus administration of intravenous contrast. Multiplanar CT image reconstructions and MIPs were obtained to evaluate the vascular anatomy. RADIATION DOSE REDUCTION: This exam was performed according to the departmental dose-optimization program which includes automated exposure control, adjustment of the mA and/or kV according to patient size and/or use of iterative reconstruction technique. CONTRAST:  159mL OMNIPAQUE IOHEXOL 350 MG/ML SOLN COMPARISON:  09/14/2022 FINDINGS: Cardiovascular: Examination is positive for acute pulmonary embolus which originates from the left pulmonary artery and extends into a anterior segmental branch of the left upper lobe pulmonary artery, image 103/9 and image 62/5. No signs of right heart strain. Heart size is normal. No pericardial effusion. Aortic atherosclerosis and coronary artery calcifications. Mediastinum/Nodes: No enlarged mediastinal, hilar, or axillary lymph nodes. Thyroid gland, trachea, and esophagus demonstrate no significant findings. Lungs/Pleura: No pleural effusion. Centrilobular and paraseptal emphysema. Persistence of cavitary process within the anteromedial right upper lobe now extends into the apex measuring 6.7 by 3.5 x 3.2 cm, image 48/5 and image 29/8. Previously this area measured 6.5 by 4.5 x 3.3 cm. More centrally, there is a stable appearing solid-appearing nodule measuring 2.7 x 2.0 cm, image 66/5. Within the right upper lobe there is a nodule containing fiducial marker which measures 2.2 x 1.4 cm, image 74/7. Previously 2.5 x 1.4 cm. Upper Abdomen: No  acute abnormality. Musculoskeletal: Remote healed right lateral rib fracture, image 103/7. No acute or suspicious osseous findings. Review of the MIP images confirms the above findings. IMPRESSION: 1. Examination is positive for acute pulmonary embolus which originates from the left main pulmonary artery and extends into a anterior segmental branch of the left upper lobe pulmonary artery. No signs of right heart strain. 2. Persistence of cavitary process within the anteromedial right upper lobe now extends into the apex measuring 6.7 x 3.5 x 3.2 cm. Previously this area measured 6.5 x 4.5 x 3.3 cm. More centrally, there is a stable appearing solid-appearing nodule measuring 2.7 x 2.0 cm. Cannot exclude underlying malignancy. Recommend referral to pulmonary medicine or thoracic surgery for further management. 3. Within the right upper lobe there is a nodule containing fiducial marker which measures 2.2 x 1.4 cm. Previously 2.5 x 1.4 cm. Also concerning for malignancy. 4. Coronary artery calcifications. 5. Aortic Atherosclerosis (ICD10-I70.0) and Emphysema (ICD10-J43.9). Critical Value/emergent results were called by telephone at the time of interpretation on 10/12/2022 at 7:09 am to provider Wynona Dove , who verbally acknowledged these results. Electronically Signed   By: Kerby Moors M.D.   On: 10/12/2022 07:11   DG Chest 2 View  Result Date: 10/12/2022 CLINICAL DATA:  Right-sided chest pain. EXAM: CHEST - 2 VIEW COMPARISON:  Chest plain film, dated September 25, 2022, and prior chest CT, dated September 14, 2022 FINDINGS: There is limited evaluation of the lateral view secondary to positioning of the patient's upper extremity. The heart size and mediastinal contours are within normal limits. Stable appearing opacities are seen within the left perihilar and left suprahilar regions. A stable, faint right mid lung opacity is seen with a small adjacent radiopaque marker. There is no evidence of a pleural effusion  or pneumothorax. Ill-defined focal opacities are seen projecting over chronic deformities involving the anterior fifth and sixth right ribs. IMPRESSION: 1. Stable left perihilar and left suprahilar opacities which correspond to areas of suspected malignancy seen on the prior chest CT.  2. Stable, faint right mid lung opacity with a small adjacent radiopaque marker. . Electronically Signed   By: Virgina Norfolk M.D.   On: 10/12/2022 01:42    Procedures .Critical Care  Performed by: Jeanell Sparrow, DO Authorized by: Jeanell Sparrow, DO   Critical care provider statement:    Critical care time (minutes):  51   Critical care time was exclusive of:  Separately billable procedures and treating other patients   Critical care was necessary to treat or prevent imminent or life-threatening deterioration of the following conditions:  Circulatory failure   Critical care was time spent personally by me on the following activities:  Development of treatment plan with patient or surrogate, discussions with consultants, evaluation of patient's response to treatment, examination of patient, ordering and review of laboratory studies, ordering and review of radiographic studies, ordering and performing treatments and interventions, pulse oximetry, re-evaluation of patient's condition, review of old charts and obtaining history from patient or surrogate     Medications Ordered in ED Medications  sodium chloride (PF) 0.9 % injection (has no administration in time range)  cefTRIAXone (ROCEPHIN) 1 g in sodium chloride 0.9 % 100 mL IVPB (1 g Intravenous New Bag/Given 10/12/22 0738)  azithromycin (ZITHROMAX) 500 mg in sodium chloride 0.9 % 250 mL IVPB (has no administration in time range)  heparin bolus via infusion 4,000 Units (has no administration in time range)  heparin ADULT infusion 100 units/mL (25000 units/257mL) (has no administration in time range)  iohexol (OMNIPAQUE) 350 MG/ML injection 100 mL (100 mLs  Intravenous Contrast Given 10/12/22 9528)    ED Course/ Medical Decision Making/ A&P Clinical Course as of 10/12/22 0750  Wed Oct 12, 2022  0720 IMPRESSION: 1. Examination is positive for acute pulmonary embolus which originates from the left main pulmonary artery and extends into a anterior segmental branch of the left upper lobe pulmonary artery. No signs of right heart strain. 2. Persistence of cavitary process within the anteromedial right upper lobe now extends into the apex measuring 6.7 x 3.5 x 3.2 cm. Previously this area measured 6.5 x 4.5 x 3.3 cm. More centrally, there is a stable appearing solid-appearing nodule measuring 2.7 x 2.0 cm. Cannot exclude underlying malignancy. Recommend referral to pulmonary medicine or thoracic surgery for further management. 3. Within the right upper lobe there is a nodule containing fiducial marker which measures 2.2 x 1.4 cm. Previously 2.5 x 1.4 cm. Also concerning for malignancy. 4. Coronary artery calcifications. 5. Aortic Atherosclerosis (ICD10-I70.0) and Emphysema (ICD10-J43.9).   [SG]    Clinical Course User Index [SG] Jeanell Sparrow, DO                           Medical Decision Making Amount and/or Complexity of Data Reviewed Labs: ordered. Radiology: ordered.  Risk Prescription drug management. Decision regarding hospitalization.   This patient presents to the ED with chief complaint(s) of chest pain, dyspnea, cough with pertinent past medical history of CC which further complicates the presenting complaint. The complaint involves an extensive differential diagnosis and also carries with it a high risk of complications and morbidity.    The differential diagnosis includes but not limited to   Differential includes all life-threatening causes for chest pain. This includes but is not exclusive to acute coronary syndrome, aortic dissection, pulmonary embolism, cardiac tamponade, community-acquired pneumonia, pericarditis,  musculoskeletal chest wall pain, etc.  In my evaluation of this patient's dyspnea my DDx includes,  but is not limited to, pneumonia, pulmonary embolism, pneumothorax, pulmonary edema, metabolic acidosis, asthma, COPD, cardiac cause, anemia, anxiety, etc.   . Serious etiologies were considered.   The initial plan is to screening labs ordered in triage, will obtain CT imaging   Additional history obtained: Additional history obtained from  na Records reviewed  prior ED visits, prior labs and imaging Home medications  Independent labs interpretation:  The following labs were independently interpreted:  CBC with hemoglobin similar to baseline, BMP stable. No leukocytosis Cr stable Troponin negative x2  Independent visualization of imaging: - I independently visualized the following imaging with scope of interpretation limited to determining acute life threatening conditions related to emergency care: X-ray, CT PE, which revealed   " IMPRESSION:  1. Examination is positive for acute pulmonary embolus which  originates from the left main pulmonary artery and extends into a  anterior segmental branch of the left upper lobe pulmonary artery.  No signs of right heart strain.  2. Persistence of cavitary process within the anteromedial right  upper lobe now extends into the apex measuring 6.7 x 3.5 x 3.2 cm.  Previously this area measured 6.5 x 4.5 x 3.3 cm. More centrally,  there is a stable appearing solid-appearing nodule measuring 2.7 x  2.0 cm. Cannot exclude underlying malignancy. Recommend referral to  pulmonary medicine or thoracic surgery for further management.  3. Within the right upper lobe there is a nodule containing fiducial  marker which measures 2.2 x 1.4 cm. Previously 2.5 x 1.4 cm. Also  concerning for malignancy.  4. Coronary artery calcifications.  5. Aortic Atherosclerosis (ICD10-I70.0) and Emphysema (ICD10-J43.9).  "  I agree with radiologist findings  Cardiac  monitoring was reviewed and interpreted by myself which shows NSR  Treatment and Reassessment: rocephin, azithromycin, heparin > symptoms unchanged, he is breathing comfortably on ambient air at this time. No hypoxia or resp distress  Consultation: - Consulted or discussed management/test interpretation w/ external professional: na  Consideration for admission or further workup: Admission was considered   PESI score is 112, no right heart strain; will start heparin also with pneumonia, cavitary?, hx underlying malignancy (SCC), recent admission for similar last month Recommend admission Pt agreeable Signed out to incoming EDP pending admission.    Social Determinants of health: Social History   Tobacco Use   Smoking status: Former    Types: Cigarettes    Quit date: 05/02/2022    Years since quitting: 0.4    Passive exposure: Never   Smokeless tobacco: Never  Vaping Use   Vaping Use: Never used  Substance Use Topics   Alcohol use: Not Currently   Drug use: No            Final Clinical Impression(s) / ED Diagnoses Final diagnoses:  Other acute pulmonary embolism without acute cor pulmonale ([place])  Pneumonia due to infectious organism, unspecified laterality, unspecified part of lung    Rx / DC Orders ED Discharge Orders     None         Jeanell Sparrow, DO 10/12/22 0750

## 2022-10-13 ENCOUNTER — Other Ambulatory Visit (HOSPITAL_COMMUNITY): Payer: Self-pay

## 2022-10-13 ENCOUNTER — Telehealth (HOSPITAL_COMMUNITY): Payer: Self-pay | Admitting: Pharmacy Technician

## 2022-10-13 ENCOUNTER — Observation Stay (HOSPITAL_COMMUNITY): Payer: Medicaid Other

## 2022-10-13 DIAGNOSIS — I5042 Chronic combined systolic (congestive) and diastolic (congestive) heart failure: Secondary | ICD-10-CM

## 2022-10-13 DIAGNOSIS — E785 Hyperlipidemia, unspecified: Secondary | ICD-10-CM | POA: Diagnosis present

## 2022-10-13 DIAGNOSIS — Z841 Family history of disorders of kidney and ureter: Secondary | ICD-10-CM | POA: Diagnosis not present

## 2022-10-13 DIAGNOSIS — J850 Gangrene and necrosis of lung: Secondary | ICD-10-CM | POA: Diagnosis present

## 2022-10-13 DIAGNOSIS — Z993 Dependence on wheelchair: Secondary | ICD-10-CM | POA: Diagnosis not present

## 2022-10-13 DIAGNOSIS — D638 Anemia in other chronic diseases classified elsewhere: Secondary | ICD-10-CM | POA: Diagnosis not present

## 2022-10-13 DIAGNOSIS — J188 Other pneumonia, unspecified organism: Secondary | ICD-10-CM | POA: Diagnosis present

## 2022-10-13 DIAGNOSIS — F17210 Nicotine dependence, cigarettes, uncomplicated: Secondary | ICD-10-CM | POA: Diagnosis present

## 2022-10-13 DIAGNOSIS — I69320 Aphasia following cerebral infarction: Secondary | ICD-10-CM | POA: Diagnosis not present

## 2022-10-13 DIAGNOSIS — I2693 Single subsegmental pulmonary embolism without acute cor pulmonale: Secondary | ICD-10-CM

## 2022-10-13 DIAGNOSIS — Z808 Family history of malignant neoplasm of other organs or systems: Secondary | ICD-10-CM | POA: Diagnosis not present

## 2022-10-13 DIAGNOSIS — I69351 Hemiplegia and hemiparesis following cerebral infarction affecting right dominant side: Secondary | ICD-10-CM | POA: Diagnosis not present

## 2022-10-13 DIAGNOSIS — Z515 Encounter for palliative care: Secondary | ICD-10-CM | POA: Diagnosis not present

## 2022-10-13 DIAGNOSIS — D630 Anemia in neoplastic disease: Secondary | ICD-10-CM | POA: Diagnosis present

## 2022-10-13 DIAGNOSIS — Z556 Problems related to health literacy: Secondary | ICD-10-CM | POA: Diagnosis not present

## 2022-10-13 DIAGNOSIS — G40909 Epilepsy, unspecified, not intractable, without status epilepticus: Secondary | ICD-10-CM | POA: Diagnosis present

## 2022-10-13 DIAGNOSIS — J440 Chronic obstructive pulmonary disease with acute lower respiratory infection: Secondary | ICD-10-CM | POA: Diagnosis present

## 2022-10-13 DIAGNOSIS — I2510 Atherosclerotic heart disease of native coronary artery without angina pectoris: Secondary | ICD-10-CM | POA: Diagnosis present

## 2022-10-13 DIAGNOSIS — J439 Emphysema, unspecified: Secondary | ICD-10-CM | POA: Diagnosis present

## 2022-10-13 DIAGNOSIS — K219 Gastro-esophageal reflux disease without esophagitis: Secondary | ICD-10-CM | POA: Diagnosis present

## 2022-10-13 DIAGNOSIS — I2699 Other pulmonary embolism without acute cor pulmonale: Secondary | ICD-10-CM | POA: Diagnosis present

## 2022-10-13 DIAGNOSIS — I110 Hypertensive heart disease with heart failure: Secondary | ICD-10-CM | POA: Diagnosis present

## 2022-10-13 DIAGNOSIS — R1310 Dysphagia, unspecified: Secondary | ICD-10-CM | POA: Diagnosis present

## 2022-10-13 DIAGNOSIS — I739 Peripheral vascular disease, unspecified: Secondary | ICD-10-CM | POA: Diagnosis present

## 2022-10-13 DIAGNOSIS — C3412 Malignant neoplasm of upper lobe, left bronchus or lung: Secondary | ICD-10-CM | POA: Diagnosis present

## 2022-10-13 DIAGNOSIS — C3491 Malignant neoplasm of unspecified part of right bronchus or lung: Secondary | ICD-10-CM | POA: Diagnosis present

## 2022-10-13 DIAGNOSIS — C3492 Malignant neoplasm of unspecified part of left bronchus or lung: Secondary | ICD-10-CM | POA: Diagnosis not present

## 2022-10-13 LAB — ECHOCARDIOGRAM COMPLETE
AR max vel: 3.31 cm2
AV Area VTI: 3.28 cm2
AV Area mean vel: 3.67 cm2
AV Mean grad: 2 mmHg
AV Peak grad: 3.9 mmHg
Ao pk vel: 0.99 m/s
Area-P 1/2: 4.8 cm2
Calc EF: 53.1 %
Height: 73 in
S' Lateral: 3.2 cm
Single Plane A2C EF: 53.6 %
Single Plane A4C EF: 54.6 %
Weight: 2560 oz

## 2022-10-13 LAB — CBC
HCT: 32.7 % — ABNORMAL LOW (ref 39.0–52.0)
Hemoglobin: 10 g/dL — ABNORMAL LOW (ref 13.0–17.0)
MCH: 30.6 pg (ref 26.0–34.0)
MCHC: 30.6 g/dL (ref 30.0–36.0)
MCV: 100 fL (ref 80.0–100.0)
Platelets: 169 10*3/uL (ref 150–400)
RBC: 3.27 MIL/uL — ABNORMAL LOW (ref 4.22–5.81)
RDW: 15 % (ref 11.5–15.5)
WBC: 5.3 10*3/uL (ref 4.0–10.5)
nRBC: 0 % (ref 0.0–0.2)

## 2022-10-13 LAB — COMPREHENSIVE METABOLIC PANEL
ALT: 16 U/L (ref 0–44)
AST: 17 U/L (ref 15–41)
Albumin: 3.4 g/dL — ABNORMAL LOW (ref 3.5–5.0)
Alkaline Phosphatase: 78 U/L (ref 38–126)
Anion gap: 7 (ref 5–15)
BUN: 21 mg/dL (ref 8–23)
CO2: 26 mmol/L (ref 22–32)
Calcium: 9.1 mg/dL (ref 8.9–10.3)
Chloride: 107 mmol/L (ref 98–111)
Creatinine, Ser: 0.78 mg/dL (ref 0.61–1.24)
GFR, Estimated: >60 mL/min (ref >60)
Glucose, Bld: 102 mg/dL — ABNORMAL HIGH (ref 70–99)
Potassium: 4.2 mmol/L (ref 3.5–5.1)
Sodium: 140 mmol/L (ref 135–145)
Total Bilirubin: 0.6 mg/dL (ref 0.3–1.2)
Total Protein: 6.9 g/dL (ref 6.5–8.1)

## 2022-10-13 LAB — HEPARIN LEVEL (UNFRACTIONATED): Heparin Unfractionated: 0.45 IU/mL (ref 0.30–0.70)

## 2022-10-13 MED ORDER — APIXABAN (ELIQUIS) VTE STARTER PACK (10MG AND 5MG)
ORAL_TABLET | ORAL | 0 refills | Status: DC
  Filled 2022-10-13: qty 74, 30d supply, fill #0

## 2022-10-13 MED ORDER — ALBUTEROL SULFATE (2.5 MG/3ML) 0.083% IN NEBU: 2.5000 mg | INHALATION_SOLUTION | RESPIRATORY_TRACT | Status: DC | PRN

## 2022-10-13 MED ORDER — ARFORMOTEROL TARTRATE 15 MCG/2ML IN NEBU
15.0000 ug | INHALATION_SOLUTION | Freq: Two times a day (BID) | RESPIRATORY_TRACT | Status: DC
  Administered 2022-10-13 – 2022-10-14 (×2): 15 ug via RESPIRATORY_TRACT
  Filled 2022-10-13 (×2): qty 2

## 2022-10-13 MED ORDER — APIXABAN 5 MG PO TABS
10.0000 mg | ORAL_TABLET | Freq: Two times a day (BID) | ORAL | Status: DC
  Administered 2022-10-13 – 2022-10-14 (×3): 10 mg via ORAL
  Filled 2022-10-13 (×3): qty 2

## 2022-10-13 MED ORDER — NICOTINE 14 MG/24HR TD PT24
14.0000 mg | MEDICATED_PATCH | Freq: Every day | TRANSDERMAL | Status: DC
  Administered 2022-10-14: 14 mg via TRANSDERMAL
  Filled 2022-10-13: qty 1

## 2022-10-13 MED ORDER — FOLIC ACID 1 MG PO TABS
1.0000 mg | ORAL_TABLET | Freq: Every day | ORAL | Status: DC
  Administered 2022-10-14: 1 mg via ORAL
  Filled 2022-10-13: qty 1

## 2022-10-13 MED ORDER — CLOPIDOGREL BISULFATE 75 MG PO TABS
75.0000 mg | ORAL_TABLET | Freq: Every day | ORAL | Status: DC
  Administered 2022-10-14: 75 mg via ORAL
  Filled 2022-10-13: qty 1

## 2022-10-13 MED ORDER — ADULT MULTIVITAMIN W/MINERALS CH
1.0000 | ORAL_TABLET | Freq: Every day | ORAL | Status: DC
  Administered 2022-10-14: 1 via ORAL
  Filled 2022-10-13: qty 1

## 2022-10-13 MED ORDER — ATORVASTATIN CALCIUM 40 MG PO TABS
40.0000 mg | ORAL_TABLET | Freq: Every day | ORAL | Status: DC
  Administered 2022-10-14: 40 mg via ORAL
  Filled 2022-10-13: qty 1

## 2022-10-13 MED ORDER — MIDODRINE HCL 5 MG PO TABS
2.5000 mg | ORAL_TABLET | Freq: Three times a day (TID) | ORAL | Status: DC
  Administered 2022-10-14 (×3): 2.5 mg via ORAL
  Filled 2022-10-13 (×3): qty 1

## 2022-10-13 MED ORDER — ONDANSETRON HCL 4 MG PO TABS: 4.0000 mg | ORAL_TABLET | Freq: Three times a day (TID) | ORAL | Status: DC | PRN

## 2022-10-13 MED ORDER — APIXABAN 5 MG PO TABS: 5.0000 mg | ORAL_TABLET | Freq: Two times a day (BID) | ORAL | Status: DC

## 2022-10-13 MED ORDER — AMOXICILLIN-POT CLAVULANATE 875-125 MG PO TABS: 1.0000 | ORAL_TABLET | Freq: Two times a day (BID) | ORAL | 0 refills | Status: AC

## 2022-10-13 MED ORDER — APIXABAN 5 MG PO TABS: 5.0000 mg | ORAL_TABLET | Freq: Two times a day (BID) | ORAL | 2 refills | Status: DC

## 2022-10-13 MED ORDER — UMECLIDINIUM BROMIDE 62.5 MCG/ACT IN AEPB
1.0000 | INHALATION_SPRAY | Freq: Every day | RESPIRATORY_TRACT | Status: DC
  Administered 2022-10-14: 1 via RESPIRATORY_TRACT
  Filled 2022-10-13: qty 7

## 2022-10-13 MED ORDER — LEVETIRACETAM 500 MG PO TABS
500.0000 mg | ORAL_TABLET | Freq: Two times a day (BID) | ORAL | Status: DC
  Administered 2022-10-13 – 2022-10-14 (×2): 500 mg via ORAL
  Filled 2022-10-13 (×2): qty 1

## 2022-10-13 MED ORDER — BUDESONIDE 0.5 MG/2ML IN SUSP
0.5000 mg | Freq: Two times a day (BID) | RESPIRATORY_TRACT | Status: DC
  Administered 2022-10-13 – 2022-10-14 (×2): 0.5 mg via RESPIRATORY_TRACT
  Filled 2022-10-13 (×2): qty 2

## 2022-10-13 MED ORDER — APIXABAN 5 MG PO TABS: 10.0000 mg | ORAL_TABLET | Freq: Two times a day (BID) | ORAL | 0 refills | Status: DC

## 2022-10-13 NOTE — TOC Benefit Eligibility Note (Signed)
Patient Teacher, English as a foreign language completed.    The patient is currently admitted and upon discharge could be taking Eliquis 5 mg.  The current 30 day co-pay is $4.00.   The patient is insured through [place] Patient Advocate Specialist [place] Patient Advocate Team Direct Number: [phone]  Fax: [phone]

## 2022-10-13 NOTE — Progress Notes (Signed)
ANTICOAGULATION CONSULT NOTE - Follow Up Consult  Pharmacy Consult for IV heparin>>Eliquis Indication: pulmonary embolus  No Known Allergies  Patient Measurements: Height: 6\' 1"  (185.4 cm) Weight: 72.6 kg (160 lb) IBW/kg (Calculated) : 79.9  Vital Signs: Temp: 97.8 F (36.6 C) (10/26 0503) BP: 109/74 (10/26 0503) Pulse Rate: 75 (10/26 0503)  Labs: Recent Labs    10/12/22 0125 10/12/22 0425 10/12/22 1739 10/13/22 0442  HGB 11.1*  --   --  10.0*  HCT 36.4*  --   --  32.7*  PLT 224  --   --  169  HEPARINUNFRC  --   --  0.39 0.45  CREATININE 0.73  --   --  0.78  TROPONINIHS 4 4  --   --     Estimated Creatinine Clearance: 98.3 mL/min (by C-G formula based on SCr of 0.78 mg/dL).   Assessment: AC/Heme: New PE. No anticoag PTA. Baseline Hgb 11.1 Plts 224.  - Hep level 0.45.  Hgb 10 down. Plts down to 169.  - Transition to Eliquis 10/26.  Goal of Therapy:  Therapeutic oral anticoagulation  Plan:  D/c IV heparin Eliquis 10mg  BID x 7d, then 5mg  BID After free starter pack, his copay is $4.    Mayes Sangiovanni S. Al[place], PharmD, BCPS Clinical Staff Pharmacist [email or web address] Alford Highland, [place] 10/13/2022,10:02 AM

## 2022-10-13 NOTE — Progress Notes (Signed)
PROGRESS NOTE    Todd Estrada  FMB:[id number] DOB: 1961-07-09 DOA: 10/12/2022 PCP: [name], MD    Brief Narrative:  61 year old gentleman with history of COPD, ongoing smoker, stage IV small cell lung cancer and currently on radiation therapy, history of stroke with right-sided hemiplegia and dysarthria, chronic systolic heart failure, seizure disorder presented to the hospital with chest pain complaining at middle of the night.  He does have a history of necrotizing pneumonia.  In the emergency room he was hemodynamically stable, tachycardic.  CT angiogram showed acute pulmonary embolism on the segmental artery, it also showed bilateral apical mass, left apical mass consistent with necrotizing tumor versus pneumonia.  Admitted due to significant findings.  Started on heparin infusion.   Assessment & Plan:   Acute pulmonary embolism secondary to lung cancer: Medically stable overnight.  On heparin infusion.  Will change to oral anticoagulation with Eliquis loading and maintenance.  Small cell lung cancer right and left lung, cavitary lung lesion: Imaging consistent with worsening cavitation of the right lung, seen by pulmonary.  Decided to treat with prolonged antibiotics.  Remains on Unasyn.  Will change to Augmentin for 3 weeks on discharge.  Outpatient follow-up, repeat his scan.  History of stroke with right-sided residual deficits and aphasia:  Seizure disorder:  Chronic combined heart failure:  COPD:  Hyperlipidemia:   DVT prophylaxis:  apixaban (ELIQUIS) tablet 10 mg  apixaban (ELIQUIS) tablet 5 mg   Code Status: Full code Family Communication: None, his nephew was not able to pick up the phone Disposition Plan: Status is: Observation The patient will require care spanning > 2 midnights and should be moved to inpatient because: Unsafe disposition     Consultants:  Pulmonary Palliative  Procedures:  None  Antimicrobials:  Augmentin  10/25---   Subjective: Patient seen and examined.  He has difficulty communicating but with NS and no nodding he tells me that he has no chest pain or shortness of breath.  He was not able to tell us how safe it is for him to stay home. Case discussed with palliative care team and social worker, will need to evaluate home safety before returning home.  Remains sinus rhythm in the monitor.  Objective: Vitals:   10/12/22 2135 10/13/22 0132 10/13/22 0503 10/13/22 1054  BP: 103/73 100/73 109/74 104/70  Pulse: 87 80 75 84  Resp: 17 17 18  (!) 22  Temp: 97.8 F (36.6 C) 98.3 F (36.8 C) 97.8 F (36.6 C) 98.2 F (36.8 C)  TempSrc:    Oral  SpO2: 100% 98% 98% 98%  Weight:      Height:        Intake/Output Summary (Last 24 hours) at 10/13/2022 1231 Last data filed at 10/13/2022 0800 Gross per 24 hour  Intake 646.33 ml  Output 1000 ml  Net -353.67 ml   Filed Weights   10/12/22 0552 10/12/22 0900 10/12/22 1512  Weight: 72.6 kg 72.7 kg 72.6 kg    Examination:  General exam: Appears calm and comfortable  Chronically sick looking but not in any distress.  Resting quite. Patient is aphasic and difficult to understand, he answers appropriately. Right upper and lower extremity is flaccid.  Left is normal. Respiratory system: Clear to auscultation. Respiratory effort normal. Cardiovascular system: S1 & S2 heard, RRR. No pedal edema. Abdomen exam: Soft and nontender.    Data Reviewed: I have personally reviewed following labs and imaging studies  CBC: Recent Labs  Lab 10/12/22 0125 10/13/22 0442  WBC  8.0 5.3  HGB 11.1* 10.0*  HCT 36.4* 32.7*  MCV 99.7 100.0  PLT 224 841   Basic Metabolic Panel: Recent Labs  Lab 10/12/22 0125 10/13/22 0442  NA 140 140  K 4.1 4.2  CL 104 107  CO2 27 26  GLUCOSE 101* 102*  BUN 27* 21  CREATININE 0.73 0.78  CALCIUM 9.3 9.1   GFR: Estimated Creatinine Clearance: 98.3 mL/min (by C-G formula based on SCr of 0.78 mg/dL). Liver  Function Tests: Recent Labs  Lab 10/13/22 0442  AST 17  ALT 16  ALKPHOS 78  BILITOT 0.6  PROT 6.9  ALBUMIN 3.4*   No results for input(s): "LIPASE", "AMYLASE" in the last 168 hours. No results for input(s): "AMMONIA" in the last 168 hours. Coagulation Profile: No results for input(s): "INR", "PROTIME" in the last 168 hours. Cardiac Enzymes: No results for input(s): "CKTOTAL", "CKMB", "CKMBINDEX", "TROPONINI" in the last 168 hours. BNP (last 3 results) No results for input(s): "PROBNP" in the last 8760 hours. HbA1C: No results for input(s): "HGBA1C" in the last 72 hours. CBG: No results for input(s): "GLUCAP" in the last 168 hours. Lipid Profile: No results for input(s): "CHOL", "HDL", "LDLCALC", "TRIG", "CHOLHDL", "LDLDIRECT" in the last 72 hours. Thyroid Function Tests: No results for input(s): "TSH", "T4TOTAL", "FREET4", "T3FREE", "THYROIDAB" in the last 72 hours. Anemia Panel: No results for input(s): "VITAMINB12", "FOLATE", "FERRITIN", "TIBC", "IRON", "RETICCTPCT" in the last 72 hours. Sepsis Labs: No results for input(s): "PROCALCITON", "LATICACIDVEN" in the last 168 hours.  Recent Results (from the past 240 hour(s))  MRSA Next Gen by PCR, Nasal     Status: None   Collection Time: 10/12/22 10:16 AM   Specimen: Nasal Swab  Result Value Ref Range Status   MRSA by PCR Next Gen NOT DETECTED NOT DETECTED Final    Comment: (NOTE) The GeneXpert MRSA Assay (FDA approved for NASAL specimens only), is one component of a comprehensive MRSA colonization surveillance program. It is not intended to diagnose MRSA infection nor to guide or monitor treatment for MRSA infections. Test performance is not FDA approved in patients less than 48 years old. Performed at [facility], [place] [address], [place], [place] [address]          Radiology Studies: ECHOCARDIOGRAM COMPLETE  Result Date: 10/13/2022    ECHOCARDIOGRAM REPORT   Patient Name:   Nam CRESTON KLAS  Date of Exam: 10/13/2022 Medical Rec #:  [id number]     Height:       73.0 in Accession #:    [id number]    Weight:       160.0 lb Date of Birth:  [birth year]     BSA:          1.957 m Patient Age:    39 years      BP:           109/74 mmHg Patient Gender: M             HR:           80 bpm. Exam Location:  Inpatient Procedure: 2D Echo Indications:    Pulmonary Embolism  History:        Patient has prior history of Echocardiogram examinations, most                 recent 08/27/2022. COPD and Stroke.  Sonographer:    Harvie Junior Referring Phys: [id number] Jonnie Finner  Sonographer Comments: Technically difficult study due to poor echo windows, no  parasternal window and no apical window. Image acquisition challenging due to respiratory motion. IMPRESSIONS  1. Left ventricular ejection fraction, by estimation, is 45 to 50%. The left ventricle has mildly decreased function. The left ventricle demonstrates global hypokinesis. Left ventricular diastolic parameters are indeterminate.  2. Right ventricular systolic function is normal. The right ventricular size is normal. There is normal pulmonary artery systolic pressure.  3. The mitral valve is normal in structure. No evidence of mitral valve regurgitation. No evidence of mitral stenosis.  4. The aortic valve is normal in structure. Aortic valve regurgitation is not visualized. No aortic stenosis is present.  5. The inferior vena cava is normal in size with greater than 50% respiratory variability, suggesting right atrial pressure of 3 mmHg. FINDINGS  Left Ventricle: Left ventricular ejection fraction, by estimation, is 45 to 50%. The left ventricle has mildly decreased function. The left ventricle demonstrates global hypokinesis. The left ventricular internal cavity size was normal in size. There is  no left ventricular hypertrophy. Left ventricular diastolic parameters are indeterminate. Right Ventricle: The right ventricular size is normal. No increase in right ventricular  wall thickness. Right ventricular systolic function is normal. There is normal pulmonary artery systolic pressure. The tricuspid regurgitant velocity is 1.06 m/s, and  with an assumed right atrial pressure of 3 mmHg, the estimated right ventricular systolic pressure is 7.5 mmHg. Left Atrium: Left atrial size was normal in size. Right Atrium: Right atrial size was normal in size. Pericardium: There is no evidence of pericardial effusion. Mitral Valve: The mitral valve is normal in structure. No evidence of mitral valve regurgitation. No evidence of mitral valve stenosis. Tricuspid Valve: The tricuspid valve is normal in structure. Tricuspid valve regurgitation is not demonstrated. No evidence of tricuspid stenosis. Aortic Valve: The aortic valve is normal in structure. Aortic valve regurgitation is not visualized. No aortic stenosis is present. Aortic valve mean gradient measures 2.0 mmHg. Aortic valve peak gradient measures 3.9 mmHg. Aortic valve area, by VTI measures 3.28 cm. Pulmonic Valve: The pulmonic valve was normal in structure. Pulmonic valve regurgitation is not visualized. No evidence of pulmonic stenosis. Aorta: The aortic root is normal in size and structure. Venous: The inferior vena cava is normal in size with greater than 50% respiratory variability, suggesting right atrial pressure of 3 mmHg. IAS/Shunts: No atrial level shunt detected by color flow Doppler.  LEFT VENTRICLE PLAX 2D LVIDd:         4.60 cm      Diastology LVIDs:         3.20 cm      LV e' lateral:   8.16 cm/s LV PW:         0.70 cm      LV E/e' lateral: 5.9 LV IVS:        0.70 cm LVOT diam:     2.30 cm LV SV:         56 LV SV Index:   29 LVOT Area:     4.15 cm  LV Volumes (MOD) LV vol d, MOD A2C: 70.4 ml LV vol d, MOD A4C: 104.0 ml LV vol s, MOD A2C: 32.7 ml LV vol s, MOD A4C: 47.2 ml LV SV MOD A2C:     37.7 ml LV SV MOD A4C:     104.0 ml LV SV MOD BP:      46.7 ml RIGHT VENTRICLE RV S prime:     21.60 cm/s TAPSE (M-mode): 2.1 cm  AORTIC VALVE AV Area (Vmax):  3.31 cm AV Area (Vmean):   3.67 cm AV Area (VTI):     3.28 cm AV Vmax:           99.10 cm/s AV Vmean:          64.300 cm/s AV VTI:            0.171 m AV Peak Grad:      3.9 mmHg AV Mean Grad:      2.0 mmHg LVOT Vmax:         79.00 cm/s LVOT Vmean:        56.800 cm/s LVOT VTI:          0.135 m LVOT/AV VTI ratio: 0.79  AORTA Ao Root diam: 3.90 cm MITRAL VALVE               TRICUSPID VALVE MV Area (PHT): 4.80 cm    TR Peak grad:   4.5 mmHg MV Decel Time: 158 msec    TR Vmax:        106.00 cm/s MV E velocity: 48.40 cm/s MV A velocity: 75.80 cm/s  SHUNTS MV E/A ratio:  0.64        Systemic VTI:  0.14 m                            Systemic Diam: 2.30 cm Kardie Tobb DO Electronically signed by Berniece Salines DO Signature Date/Time: 10/13/2022/10:34:26 AM    Final    CT Angio Chest PE W and/or Wo Contrast  Result Date: 10/12/2022 CLINICAL DATA:  Evaluate for pulmonary embolus.  Chest pain. * Tracking Code: BO * EXAM: CT ANGIOGRAPHY CHEST WITH CONTRAST TECHNIQUE: Multidetector CT imaging of the chest was performed using the standard protocol during bolus administration of intravenous contrast. Multiplanar CT image reconstructions and MIPs were obtained to evaluate the vascular anatomy. RADIATION DOSE REDUCTION: This exam was performed according to the departmental dose-optimization program which includes automated exposure control, adjustment of the mA and/or kV according to patient size and/or use of iterative reconstruction technique. CONTRAST:  182mL OMNIPAQUE IOHEXOL 350 MG/ML SOLN COMPARISON:  09/14/2022 FINDINGS: Cardiovascular: Examination is positive for acute pulmonary embolus which originates from the left pulmonary artery and extends into a anterior segmental branch of the left upper lobe pulmonary artery, image 103/9 and image 62/5. No signs of right heart strain. Heart size is normal. No pericardial effusion. Aortic atherosclerosis and coronary artery calcifications.  Mediastinum/Nodes: No enlarged mediastinal, hilar, or axillary lymph nodes. Thyroid gland, trachea, and esophagus demonstrate no significant findings. Lungs/Pleura: No pleural effusion. Centrilobular and paraseptal emphysema. Persistence of cavitary process within the anteromedial right upper lobe now extends into the apex measuring 6.7 by 3.5 x 3.2 cm, image 48/5 and image 29/8. Previously this area measured 6.5 by 4.5 x 3.3 cm. More centrally, there is a stable appearing solid-appearing nodule measuring 2.7 x 2.0 cm, image 66/5. Within the right upper lobe there is a nodule containing fiducial marker which measures 2.2 x 1.4 cm, image 74/7. Previously 2.5 x 1.4 cm. Upper Abdomen: No acute abnormality. Musculoskeletal: Remote healed right lateral rib fracture, image 103/7. No acute or suspicious osseous findings. Review of the MIP images confirms the above findings. IMPRESSION: 1. Examination is positive for acute pulmonary embolus which originates from the left main pulmonary artery and extends into a anterior segmental branch of the left upper lobe pulmonary artery. No signs of right heart strain. 2. Persistence of cavitary process within  the anteromedial right upper lobe now extends into the apex measuring 6.7 x 3.5 x 3.2 cm. Previously this area measured 6.5 x 4.5 x 3.3 cm. More centrally, there is a stable appearing solid-appearing nodule measuring 2.7 x 2.0 cm. Cannot exclude underlying malignancy. Recommend referral to pulmonary medicine or thoracic surgery for further management. 3. Within the right upper lobe there is a nodule containing fiducial marker which measures 2.2 x 1.4 cm. Previously 2.5 x 1.4 cm. Also concerning for malignancy. 4. Coronary artery calcifications. 5. Aortic Atherosclerosis (ICD10-I70.0) and Emphysema (ICD10-J43.9). Critical Value/emergent results were called by telephone at the time of interpretation on 10/12/2022 at 7:09 am to provider Wynona Dove , who verbally acknowledged these  results. Electronically Signed   By: Kerby Moors M.D.   On: 10/12/2022 07:11   DG Chest 2 View  Result Date: 10/12/2022 CLINICAL DATA:  Right-sided chest pain. EXAM: CHEST - 2 VIEW COMPARISON:  Chest plain film, dated September 25, 2022, and prior chest CT, dated September 14, 2022 FINDINGS: There is limited evaluation of the lateral view secondary to positioning of the patient's upper extremity. The heart size and mediastinal contours are within normal limits. Stable appearing opacities are seen within the left perihilar and left suprahilar regions. A stable, faint right mid lung opacity is seen with a small adjacent radiopaque marker. There is no evidence of a pleural effusion or pneumothorax. Ill-defined focal opacities are seen projecting over chronic deformities involving the anterior fifth and sixth right ribs. IMPRESSION: 1. Stable left perihilar and left suprahilar opacities which correspond to areas of suspected malignancy seen on the prior chest CT. 2. Stable, faint right mid lung opacity with a small adjacent radiopaque marker. . Electronically Signed   By: Virgina Norfolk M.D.   On: 10/12/2022 01:42        Scheduled Meds:  apixaban  10 mg Oral BID   Followed by   Derrill Memo ON 10/20/2022] apixaban  5 mg Oral BID   Continuous Infusions:  ampicillin-sulbactam (UNASYN) IV 3 g (10/13/22 1118)     LOS: 0 days    Time spent: 35 minutes    Barb Merino, MD [facility] Pager [phone]

## 2022-10-13 NOTE — Evaluation (Signed)
Clinical/Bedside Swallow Evaluation Patient Details  Name: Todd Estrada MRN: [id number] Date of Birth: [birth year]  Today's Date: 10/13/2022 Time: SLP Start Time (ACUTE ONLY): 15 SLP Stop Time (ACUTE ONLY): 1440 SLP Time Calculation (min) (ACUTE ONLY): 15 min  Past Medical History:  Past Medical History:  Diagnosis Date   Asthma    COPD (chronic obstructive pulmonary disease) ([place])    Essential hypertension 08/19/2021   GERD (gastroesophageal reflux disease)    History of tracheostomy    03/09/22-04/11/22   HLD (hyperlipidemia)    Hypertension    Lung cancer ([place])    PAD (peripheral artery disease) ([place])    Seizures ([place]) 06/02/2022   Stroke ([place]) 02/2022   Past Surgical History:  Past Surgical History:  Procedure Laterality Date   BRONCHIAL BIOPSY  05/30/2022   Procedure: BRONCHIAL BIOPSIES;  Surgeon: Collene Gobble, MD;  Location: [facility] ENDOSCOPY;  Service: Pulmonary;;   BRONCHIAL BRUSHINGS  05/30/2022   Procedure: BRONCHIAL BRUSHINGS;  Surgeon: Collene Gobble, MD;  Location: [facility] ENDOSCOPY;  Service: Pulmonary;;   BRONCHIAL NEEDLE ASPIRATION BIOPSY  05/30/2022   Procedure: BRONCHIAL NEEDLE ASPIRATION BIOPSIES;  Surgeon: Collene Gobble, MD;  Location: [facility] ENDOSCOPY;  Service: Pulmonary;;   BRONCHIAL WASHINGS  05/30/2022   Procedure: BRONCHIAL WASHINGS;  Surgeon: Collene Gobble, MD;  Location: [facility] ENDOSCOPY;  Service: Pulmonary;;   ESOPHAGOGASTRODUODENOSCOPY (EGD) WITH PROPOFOL N/A 03/11/2022   Procedure: ESOPHAGOGASTRODUODENOSCOPY (EGD) WITH PROPOFOL;  Surgeon: Georganna Skeans, MD;  Location: [place];  Service: General;  Laterality: N/A;   FIDUCIAL MARKER PLACEMENT  05/30/2022   Procedure: FIDUCIAL MARKER PLACEMENT;  Surgeon: Collene Gobble, MD;  Location: [facility] ENDOSCOPY;  Service: Pulmonary;;   IR ANGIO INTRA EXTRACRAN SEL COM CAROTID INNOMINATE UNI R MOD SED  03/02/2022   IR CT HEAD LTD  03/02/2022   IR PERCUTANEOUS ART THROMBECTOMY/INFUSION INTRACRANIAL INC DIAG ANGIO  03/02/2022    PEG PLACEMENT N/A 03/11/2022   Procedure: PERCUTANEOUS ENDOSCOPIC GASTROSTOMY (PEG) PLACEMENT;  Surgeon: Georganna Skeans, MD;  Location: [place];  Service: General;  Laterality: N/A;   RADIOLOGY WITH ANESTHESIA N/A 03/02/2022   Procedure: IR WITH ANESTHESIA;  Surgeon: Luanne Bras, MD;  Location: [place];  Service: Radiology;  Laterality: N/A;   VIDEO BRONCHOSCOPY WITH RADIAL ENDOBRONCHIAL ULTRASOUND  05/30/2022   Procedure: VIDEO BRONCHOSCOPY WITH RADIAL ENDOBRONCHIAL ULTRASOUND;  Surgeon: Collene Gobble, MD;  Location: [facility] ENDOSCOPY;  Service: Pulmonary;;   HPI:  Patient is a 61 y.o. male with PMH: COPD, ongoing smoker, stage IV small cell lung cancer and currently on radiation therapy, h/o CVA with right side hemiplegia, dysarthria and expressive aphasia,  (best with yes/no questions) h/o necrotizing PNA. He presented to the ED on 10/12/22 with chest pain. He was hemodynamically stable in ER, tachycardic, CT angiogram showed acute pulmonary emoblizm on segmental artery as well as bilateral apical mass, left apical mass consistent with necrotizing tumor versus PNA.    Assessment / Plan / Recommendation  Clinical Impression  Patient presenting with what appears to be a primary pharyngeal phase dysphagia as per this bedside/clinical swallow evaluation. She did not exhibit any overt s/s aspiration or penetration with straw sips thin liquids or bites of puree/thins (ice cream). He did exhibit a suspected delayed swallow initiation and somewhat reduced laryngeal elevation. (do not suspect it is significantly different from previous admissions). As patient with potential PNA, SLP will follow briefly to ensure diet toleration. SLP Visit Diagnosis: Dysphagia, unspecified (R13.10)    Aspiration Risk  Mild aspiration  risk    Diet Recommendation Dysphagia 3 (Mech soft);Thin liquid   Liquid Administration via: Cup;Straw Medication Administration: Whole meds with liquid Supervision: Staff to  assist with self feeding;Patient able to self feed Compensations: Slow rate;Small sips/bites Postural Changes: Seated upright at 90 degrees    Other  Recommendations Oral Care Recommendations: Oral care BID    Recommendations for follow up therapy are one component of a multi-disciplinary discharge planning process, led by the attending physician.  Recommendations may be updated based on patient status, additional functional criteria and insurance authorization.  Follow up Recommendations Follow physician's recommendations for discharge plan and follow up therapies      Assistance Recommended at Discharge Set up Supervision/Assistance  Functional Status Assessment Patient has had a recent decline in their functional status and demonstrates the ability to make significant improvements in function in a reasonable and predictable amount of time.  Frequency and Duration min 1 x/week  1 week       Prognosis Prognosis for Safe Diet Advancement: Good Barriers to Reach Goals: Time post onset      Swallow Study   General Date of Onset: 10/12/22 HPI: Patient is a 61 y.o. male with PMH: COPD, ongoing smoker, stage IV small cell lung cancer and currently on radiation therapy, h/o CVA with right side hemiplegia, dysarthria and expressive aphasia,  (best with yes/no questions) h/o necrotizing PNA. He presented to the ED on 10/12/22 with chest pain. He was hemodynamically stable in ER, tachycardic, CT angiogram showed acute pulmonary emoblizm on segmental artery as well as bilateral apical mass, left apical mass consistent with necrotizing tumor versus PNA. Type of Study: Bedside Swallow Evaluation Previous Swallow Assessment: during previous admissions Diet Prior to this Study: Regular;Thin liquids Temperature Spikes Noted: No Respiratory Status: Room air History of Recent Intubation: No Behavior/Cognition: Alert;Cooperative;Pleasant mood Oral Cavity Assessment: Within Functional Limits Oral  Care Completed by SLP: No Oral Cavity - Dentition: Edentulous Self-Feeding Abilities: Needs assist;Needs set up;Other (Comment) (patient able to manage drink in cup but requested SLP feed him ice cream) Baseline Vocal Quality: Normal Volitional Cough: Strong Volitional Swallow: Able to elicit    Oral/Motor/Sensory Function Overall Oral Motor/Sensory Function: Within functional limits   Ice Chips     Thin Liquid Thin Liquid: Impaired Presentation: Straw Pharyngeal  Phase Impairments: Suspected delayed Swallow    Nectar Thick     Honey Thick     Puree Puree: Within functional limits Presentation: [place], MA, CCC-SLP Speech Therapy

## 2022-10-13 NOTE — Telephone Encounter (Signed)
Pharmacy Patient Advocate Encounter  Insurance verification completed.    The patient is insured through [employer]   The patient is currently admitted and ran test claims for the following: Eliquis.  Copays and coinsurance results were relayed to Inpatient clinical team.

## 2022-10-13 NOTE — TOC Initial Note (Addendum)
Transition of Care [facility]) - Initial/Assessment Note    Patient Details  Name: Todd Estrada MRN: [id number] Date of Birth: [birth year]  Transition [facility]) CM/SW Contact:    Vassie Moselle, LCSW Phone Number: 10/13/2022, 11:16 AM  Clinical Narrative:                 TOC consulted for housing needs in regards to pests in the home.  Met with pt who was able to communicate with nods/gestures. Pt confirms he currently lives with his cousin and does nod that there are pests in the home. Pt is agreeable to having resources for agencies that can assist with advocacy with landlords for pest control and also can provide financial resources for housing.  Resources added to pt's chart to be given with discharge instructions.   Update 1:15pm- Pt currently receiving home health services through [facility]. Pt is receiving home health PT,SLP, and SW services.  CSW left voicemail for pt's cousin whom he lives with to discuss home environment and level of support pt is able to receive. Concerns for pt's safety at home noted.    Expected Discharge Plan: Home/Self Care Barriers to Discharge: No Barriers Identified   Patient Goals and CMS Choice Patient states their goals for this hospitalization and ongoing recovery are:: To go home CMS Medicare.gov Compare Post Acute Care list provided to:: Patient Choice offered to / list presented to : Patient  Expected Discharge Plan and Services Expected Discharge Plan: Home/Self Care In-house Referral: NA Discharge Planning Services: CM Consult Post Acute Care Choice: NA Living arrangements for the past 2 months: Single Family Home Expected Discharge Date: 10/13/22               DME Arranged: N/A DME Agency: NA                  Prior Living Arrangements/Services Living arrangements for the past 2 months: Single Family Home Lives with:: Relatives Patient language and need for interpreter reviewed:: Yes Do you feel safe going back to the place  where you live?: Yes      Need for Family Participation in Patient Care: Yes (Comment) Care giver support system in place?: No (comment)   Criminal Activity/Legal Involvement Pertinent to Current Situation/Hospitalization: No - Comment as needed  Activities of Daily Living Home Assistive Devices/Equipment: Bedside commode/3-in-1, Shower chair with back ADL Screening (condition at time of admission) Patient's cognitive ability adequate to safely complete daily activities?: No Is the patient deaf or have difficulty hearing?: No Does the patient have difficulty seeing, even when wearing glasses/contacts?: No Does the patient have difficulty concentrating, remembering, or making decisions?: Yes Patient able to express need for assistance with ADLs?: Yes (pt can say yes and no and point to things) Does the patient have difficulty dressing or bathing?: Yes Independently performs ADLs?: No Communication: Independent (can answer yes and no) Dressing (OT): Needs assistance Is this a change from baseline?: Pre-admission baseline Grooming: Needs assistance Is this a change from baseline?: Pre-admission baseline Feeding: Independent Bathing: Needs assistance Is this a change from baseline?: Pre-admission baseline Toileting: Needs assistance Is this a change from baseline?: Pre-admission baseline In/Out Bed: Needs assistance Is this a change from baseline?: Pre-admission baseline Walks in Home: Needs assistance Is this a change from baseline?: Pre-admission baseline Does the patient have difficulty walking or climbing stairs?: Yes Weakness of Legs: Both Weakness of Arms/Hands: Both  Permission Sought/Granted Permission sought to share information with : Family Supports Permission  granted to share information with : Yes, Verbal Permission Granted  Share Information with NAME: Pinchos Topel     Permission granted to share info w Relationship: Cousin  Permission granted to share info w Contact  Information: [phone]  Emotional Assessment Appearance:: Appears older than stated age Attitude/Demeanor/Rapport: Engaged Affect (typically observed): Pleasant Orientation: : Oriented to Self Alcohol / Substance Use: Not Applicable Psych Involvement: No (comment)  Admission diagnosis:  Acute pulmonary embolism ([place]) [I26.99] Other acute pulmonary embolism without acute cor pulmonale (HCC) [I26.99] Pneumonia due to infectious organism, unspecified laterality, unspecified part of lung [J18.9] Patient Active Problem List   Diagnosis Date Noted   Acute pulmonary embolism ([place]) 10/12/2022   HAP (hospital-acquired pneumonia) 09/16/2022   Community acquired pneumonia 09/14/2022   Cervical transverse process fracture ([place]) 09/10/2022   Unwitnessed fall 08/27/2022   Aphasia as late effect of cerebrovascular accident 08/16/2022   Sepsis ([place]) 08/13/2022   Atypical chest pain 08/13/2022   Anemia of chronic disease 08/13/2022   Hypokalemia 08/13/2022   Hypomagnesemia 08/13/2022   Chronic combined systolic and diastolic heart failure ([place]) 08/13/2022   History of seizures 08/13/2022   Multifocal pneumonia 08/12/2022   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[place]) 06/24/2022   Encounter for antineoplastic chemotherapy 06/24/2022   Healthcare maintenance 06/22/2022   Seizures ([place]) 06/02/2022   Squamous cell lung cancer ([place]) 05/10/2022   Emphysema lung ([place]) 04/14/2022   Dyslipidemia    Dysphagia, post-stroke    Internal carotid artery occlusion, left 03/03/2022   Malnutrition of moderate degree 03/03/2022   Hx of ischemic left MCA stroke 03/02/2022   Tobacco use disorder 08/19/2021   CVA (cerebral vascular accident) ([place]) 08/18/2021   PCP:  [name], MD Pharmacy:   [id number] [place], [place] - [address] AT [facility] OF [place] [address] Phone: [phone] Fax: [phone]  [name] [address] [place] [address] Phone: [phone] Fax: [phone]     Social Determinants of Health (SDOH) Interventions    Readmission Risk Interventions    08/30/2022    3:39 PM 08/16/2022    4:28 PM  Readmission Risk Prevention Plan  Transportation Screening Complete Complete  Medication Review ([place]) Complete Complete  PCP or Specialist appointment within 3-5 days of discharge Complete   HRI or [place] Complete Complete  SW Recovery Care/Counseling Consult Complete Complete  Palliative Care Screening Not Applicable Not [place] Not Applicable Not Applicable

## 2022-10-13 NOTE — Discharge Instructions (Signed)
Information on my medicine - ELIQUIS (apixaban)  Why was Eliquis prescribed for you? Eliquis was prescribed to treat blood clots that may have been found in the veins of your legs (deep vein thrombosis) or in your lungs (pulmonary embolism) and to reduce the risk of them occurring again.  What do You need to know about Eliquis ? The starting dose is 10 mg (two 5 mg tablets) taken TWICE daily for the FIRST SEVEN (7) DAYS, then on (enter date)  10/20/2022  the dose is reduced to ONE 5 mg tablet taken TWICE daily.  Eliquis may be taken with or without food.   Try to take the dose about the same time in the morning and in the evening. If you have difficulty swallowing the tablet whole please discuss with your pharmacist how to take the medication safely.  Take Eliquis exactly as prescribed and DO NOT stop taking Eliquis without talking to the doctor who prescribed the medication.  Stopping may increase your risk of developing a new blood clot.  Refill your prescription before you run out.  After discharge, you should have regular check-up appointments with your healthcare provider that is prescribing your Eliquis.    What do you do if you miss a dose? If a dose of ELIQUIS is not taken at the scheduled time, take it as soon as possible on the same day and twice-daily administration should be resumed. The dose should not be doubled to make up for a missed dose.  Important Safety Information A possible side effect of Eliquis is bleeding. You should call your healthcare provider right away if you experience any of the following: Bleeding from an injury or your nose that does not stop. Unusual colored urine (red or dark brown) or unusual colored stools (red or black). Unusual bruising for unknown reasons. A serious fall or if you hit your head (even if there is no bleeding).  Some medicines may interact with Eliquis and might increase your risk of bleeding or clotting while on Eliquis. To  help avoid this, consult your healthcare provider or pharmacist prior to using any new prescription or non-prescription medications, including herbals, vitamins, non-steroidal anti-inflammatory drugs (NSAIDs) and supplements.  This website has more information on Eliquis (apixaban): http://www.eliquis.com/eliquis/home

## 2022-10-13 NOTE — Telephone Encounter (Signed)
Pt has been scheduled for a HFU 11/15. This appt will show up on AVS once pt is discharged from the hospital. If pt needs to cancel/reschedule appt, this can be done so by pt calling the office.

## 2022-10-13 NOTE — Progress Notes (Signed)
  Echocardiogram 2D Echocardiogram has been performed.  Todd Estrada 10/13/2022, 9:47 AM

## 2022-10-14 ENCOUNTER — Other Ambulatory Visit (HOSPITAL_COMMUNITY): Payer: Self-pay

## 2022-10-14 ENCOUNTER — Encounter: Payer: Self-pay | Admitting: Internal Medicine

## 2022-10-14 DIAGNOSIS — C3492 Malignant neoplasm of unspecified part of left bronchus or lung: Secondary | ICD-10-CM

## 2022-10-14 LAB — BASIC METABOLIC PANEL
Anion gap: 9 (ref 5–15)
BUN: 18 mg/dL (ref 8–23)
CO2: 27 mmol/L (ref 22–32)
Calcium: 9.4 mg/dL (ref 8.9–10.3)
Chloride: 105 mmol/L (ref 98–111)
Creatinine, Ser: 0.67 mg/dL (ref 0.61–1.24)
GFR, Estimated: >60 mL/min (ref >60)
Glucose, Bld: 114 mg/dL — ABNORMAL HIGH (ref 70–99)
Potassium: 4 mmol/L (ref 3.5–5.1)
Sodium: 141 mmol/L (ref 135–145)

## 2022-10-14 LAB — MAGNESIUM: Magnesium: 2.3 mg/dL (ref 1.7–2.4)

## 2022-10-14 LAB — PHOSPHORUS: Phosphorus: 4.5 mg/dL (ref 2.5–4.6)

## 2022-10-14 LAB — LEGIONELLA PNEUMOPHILA SEROGP 1 UR AG: L. pneumophila Serogp 1 Ur Ag: NEGATIVE

## 2022-10-14 NOTE — Progress Notes (Signed)
PROGRESS NOTE    Todd Estrada  WCB:[id number] DOB: [birth year] DOA: 10/12/2022 PCP: Todd Axe, MD    Brief Narrative:  61 year old gentleman with history of COPD, ongoing smoker, stage IV small cell lung cancer and currently on radiation therapy, history of stroke with right-sided hemiplegia and dysarthria, chronic systolic heart failure, seizure disorder presented to the hospital with chest pain complaining at middle of the night.  He does have a history of necrotizing pneumonia.  In the emergency room he was hemodynamically stable, tachycardic.  CT angiogram showed acute pulmonary embolism on the segmental artery, it also showed bilateral apical mass, left apical mass consistent with necrotizing tumor versus pneumonia.  Admitted due to significant findings.  Started on heparin infusion.   Assessment & Plan:   Acute pulmonary embolism secondary to lung cancer: Medically stable overnight.  On heparin infusion.  Will change to oral anticoagulation with Eliquis loading and maintenance.  Small cell lung cancer right and left lung, cavitary lung lesion: Imaging consistent with worsening cavitation of the right lung, seen by pulmonary.  Decided to treat with prolonged antibiotics.  Remains on Unasyn.  Will change to Augmentin for 3 weeks on discharge.  Outpatient follow-up, repeat his scan.  History of stroke with right-sided residual deficits and aphasia: Remains overall poor condition.  Mostly bedbound.  Patient is on Plavix, should tolerate along with Eliquis.  Seizure disorder: On Keppra and continued.  Chronic combined heart failure: Well compensated.  COPD: Nebs as needed.  He is a smoker.  Nicotine patch provided.  Less likely to quit.  Hyperlipidemia: Tolerating statin.  Safe discharge planning: Unable to reach his family to discuss safe disposition.  Palliative care and social worker is following.   DVT prophylaxis:  apixaban (ELIQUIS) tablet 10 mg  apixaban (ELIQUIS)  tablet 5 mg   Code Status: Full code Family Communication: None, his nephew was not able to pick up the phone Disposition Plan: Status is: Inpatient.  Unsafe discharge.   Consultants:  Pulmonary Palliative  Procedures:  None  Antimicrobials:  Unasyn 10/25---   Subjective: Patient seen and examined.  Denies any complaints.  Just wants to sleep.  Difficult to understand with his dysarthria but he tells me no to any chest pain or shortness of breath.  Objective: Vitals:   10/13/22 2047 10/13/22 2323 10/14/22 0457 10/14/22 0739  BP: 116/79  111/64   Pulse: 84 75 78   Resp: 18 18 18    Temp: 98.7 F (37.1 C)  97.9 F (36.6 C)   TempSrc: Oral  Oral   SpO2: 100% 99% 96% 95%  Weight:      Height:        Intake/Output Summary (Last 24 hours) at 10/14/2022 1132 Last data filed at 10/14/2022 0500 Gross per 24 hour  Intake 932.94 ml  Output 3300 ml  Net -2367.06 ml    Filed Weights   10/12/22 0552 10/12/22 0900 10/12/22 1512  Weight: 72.6 kg 72.7 kg 72.6 kg    Examination:  General exam: Appears calm and comfortable  Chronically sick looking but not in any distress.  Resting quite. Patient is aphasic and difficult to understand, he answers appropriately with nodding. Right upper and lower extremity is flaccid, 2/5.  Left is normal. Respiratory system: Clear to auscultation. Respiratory effort normal. Cardiovascular system: S1 & S2 heard, RRR. No pedal edema. Abdomen exam: Soft and nontender.    Data Reviewed: I have personally reviewed following labs and imaging studies  CBC: Recent Labs  Lab  10/12/22 0125 10/13/22 0442  WBC 8.0 5.3  HGB 11.1* 10.0*  HCT 36.4* 32.7*  MCV 99.7 100.0  PLT 224 662    Basic Metabolic Panel: Recent Labs  Lab 10/12/22 0125 10/13/22 0442 10/14/22 0521  NA 140 140 141  K 4.1 4.2 4.0  CL 104 107 105  CO2 27 26 27   GLUCOSE 101* 102* 114*  BUN 27* 21 18  CREATININE 0.73 0.78 0.67  CALCIUM 9.3 9.1 9.4  MG  --   --  2.3   PHOS  --   --  4.5    GFR: Estimated Creatinine Clearance: 98.3 mL/min (by C-G formula based on SCr of 0.67 mg/dL). Liver Function Tests: Recent Labs  Lab 10/13/22 0442  AST 17  ALT 16  ALKPHOS 78  BILITOT 0.6  PROT 6.9  ALBUMIN 3.4*    No results for input(s): "LIPASE", "AMYLASE" in the last 168 hours. No results for input(s): "AMMONIA" in the last 168 hours. Coagulation Profile: No results for input(s): "INR", "PROTIME" in the last 168 hours. Cardiac Enzymes: No results for input(s): "CKTOTAL", "CKMB", "CKMBINDEX", "TROPONINI" in the last 168 hours. BNP (last 3 results) No results for input(s): "PROBNP" in the last 8760 hours. HbA1C: No results for input(s): "HGBA1C" in the last 72 hours. CBG: No results for input(s): "GLUCAP" in the last 168 hours. Lipid Profile: No results for input(s): "CHOL", "HDL", "LDLCALC", "TRIG", "CHOLHDL", "LDLDIRECT" in the last 72 hours. Thyroid Function Tests: No results for input(s): "TSH", "T4TOTAL", "FREET4", "T3FREE", "THYROIDAB" in the last 72 hours. Anemia Panel: No results for input(s): "VITAMINB12", "FOLATE", "FERRITIN", "TIBC", "IRON", "RETICCTPCT" in the last 72 hours. Sepsis Labs: No results for input(s): "PROCALCITON", "LATICACIDVEN" in the last 168 hours.  Recent Results (from the past 240 hour(s))  MRSA Next Gen by PCR, Nasal     Status: None   Collection Time: 10/12/22 10:16 AM   Specimen: Nasal Swab  Result Value Ref Range Status   MRSA by PCR Next Gen NOT DETECTED NOT DETECTED Final    Comment: (NOTE) The GeneXpert MRSA Assay (FDA approved for NASAL specimens only), is one component of a comprehensive MRSA colonization surveillance program. It is not intended to diagnose MRSA infection nor to guide or monitor treatment for MRSA infections. Test performance is not FDA approved in patients less than 71 years old. Performed at [facility], [place] [address], [place], [place] [address]           Radiology Studies: ECHOCARDIOGRAM COMPLETE  Result Date: 10/13/2022    ECHOCARDIOGRAM REPORT   Patient Name:   Todd Estrada Date of Exam: 10/13/2022 Medical Rec #:  [id number]     Height:       73.0 in Accession #:    [id number]    Weight:       160.0 lb Date of Birth:  [birth year]     BSA:          1.957 m Patient Age:    19 years      BP:           109/74 mmHg Patient Gender: M             HR:           80 bpm. Exam Location:  Inpatient Procedure: 2D Echo Indications:    Pulmonary Embolism  History:        Patient has prior history of Echocardiogram examinations, most  recent 08/27/2022. COPD and Stroke.  Sonographer:    Harvie Junior Referring Phys: [id number] Jonnie Finner  Sonographer Comments: Technically difficult study due to poor echo windows, no parasternal window and no apical window. Image acquisition challenging due to respiratory motion. IMPRESSIONS  1. Left ventricular ejection fraction, by estimation, is 45 to 50%. The left ventricle has mildly decreased function. The left ventricle demonstrates global hypokinesis. Left ventricular diastolic parameters are indeterminate.  2. Right ventricular systolic function is normal. The right ventricular size is normal. There is normal pulmonary artery systolic pressure.  3. The mitral valve is normal in structure. No evidence of mitral valve regurgitation. No evidence of mitral stenosis.  4. The aortic valve is normal in structure. Aortic valve regurgitation is not visualized. No aortic stenosis is present.  5. The inferior vena cava is normal in size with greater than 50% respiratory variability, suggesting right atrial pressure of 3 mmHg. FINDINGS  Left Ventricle: Left ventricular ejection fraction, by estimation, is 45 to 50%. The left ventricle has mildly decreased function. The left ventricle demonstrates global hypokinesis. The left ventricular internal cavity size was normal in size. There is  no left ventricular hypertrophy. Left  ventricular diastolic parameters are indeterminate. Right Ventricle: The right ventricular size is normal. No increase in right ventricular wall thickness. Right ventricular systolic function is normal. There is normal pulmonary artery systolic pressure. The tricuspid regurgitant velocity is 1.06 m/s, and  with an assumed right atrial pressure of 3 mmHg, the estimated right ventricular systolic pressure is 7.5 mmHg. Left Atrium: Left atrial size was normal in size. Right Atrium: Right atrial size was normal in size. Pericardium: There is no evidence of pericardial effusion. Mitral Valve: The mitral valve is normal in structure. No evidence of mitral valve regurgitation. No evidence of mitral valve stenosis. Tricuspid Valve: The tricuspid valve is normal in structure. Tricuspid valve regurgitation is not demonstrated. No evidence of tricuspid stenosis. Aortic Valve: The aortic valve is normal in structure. Aortic valve regurgitation is not visualized. No aortic stenosis is present. Aortic valve mean gradient measures 2.0 mmHg. Aortic valve peak gradient measures 3.9 mmHg. Aortic valve area, by VTI measures 3.28 cm. Pulmonic Valve: The pulmonic valve was normal in structure. Pulmonic valve regurgitation is not visualized. No evidence of pulmonic stenosis. Aorta: The aortic root is normal in size and structure. Venous: The inferior vena cava is normal in size with greater than 50% respiratory variability, suggesting right atrial pressure of 3 mmHg. IAS/Shunts: No atrial level shunt detected by color flow Doppler.  LEFT VENTRICLE PLAX 2D LVIDd:         4.60 cm      Diastology LVIDs:         3.20 cm      LV e' lateral:   8.16 cm/s LV PW:         0.70 cm      LV E/e' lateral: 5.9 LV IVS:        0.70 cm LVOT diam:     2.30 cm LV SV:         56 LV SV Index:   29 LVOT Area:     4.15 cm  LV Volumes (MOD) LV vol d, MOD A2C: 70.4 ml LV vol d, MOD A4C: 104.0 ml LV vol s, MOD A2C: 32.7 ml LV vol s, MOD A4C: 47.2 ml LV SV MOD  A2C:     37.7 ml LV SV MOD A4C:     104.0 ml LV  SV MOD BP:      46.7 ml RIGHT VENTRICLE RV S prime:     21.60 cm/s TAPSE (M-mode): 2.1 cm AORTIC VALVE AV Area (Vmax):    3.31 cm AV Area (Vmean):   3.67 cm AV Area (VTI):     3.28 cm AV Vmax:           99.10 cm/s AV Vmean:          64.300 cm/s AV VTI:            0.171 m AV Peak Grad:      3.9 mmHg AV Mean Grad:      2.0 mmHg LVOT Vmax:         79.00 cm/s LVOT Vmean:        56.800 cm/s LVOT VTI:          0.135 m LVOT/AV VTI ratio: 0.79  AORTA Ao Root diam: 3.90 cm MITRAL VALVE               TRICUSPID VALVE MV Area (PHT): 4.80 cm    TR Peak grad:   4.5 mmHg MV Decel Time: 158 msec    TR Vmax:        106.00 cm/s MV E velocity: 48.40 cm/s MV A velocity: 75.80 cm/s  SHUNTS MV E/A ratio:  0.64        Systemic VTI:  0.14 m                            Systemic Diam: 2.30 cm Kardie Tobb DO Electronically signed by Berniece Salines DO Signature Date/Time: 10/13/2022/10:34:26 AM    Final         Scheduled Meds:  apixaban  10 mg Oral BID   Followed by   Derrill Memo ON 10/20/2022] apixaban  5 mg Oral BID   arformoterol  15 mcg Nebulization BID   And   umeclidinium bromide  1 puff Inhalation Daily   atorvastatin  40 mg Oral Daily   budesonide  0.5 mg Nebulization BID   clopidogrel  75 mg Oral Daily   folic acid  1 mg Oral Daily   levETIRAcetam  500 mg Oral BID   midodrine  2.5 mg Oral TID WC   multivitamin with minerals  1 tablet Oral Daily   nicotine  14 mg Transdermal Daily   Continuous Infusions:  ampicillin-sulbactam (UNASYN) IV 3 g (10/14/22 0552)     LOS: 1 day    Time spent: 35 minutes    Barb Merino, MD [facility] Pager [phone]

## 2022-10-14 NOTE — Progress Notes (Signed)
Called and left voicemail for emergency contact listed in patient chart to call me back regarding transportation back home.  Patient has discharged orders but unable to reach family via phone and no contacts at bedside to arrange transportation.

## 2022-10-14 NOTE — Evaluation (Signed)
Physical Therapy Evaluation Patient Details Name: Todd Estrada MRN: [id number] DOB: 1961/11/15 Today's Date: 10/14/2022  History of Present Illness  Patient is 61 y.o. male presented to the hospital on 10/25 with chest pain in the middle of the night.  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 Admitted due to significant findings. Started on heparin infusion. Of note pt with several recent hospitalizations: 9/26-9/29/23 for CAP 9/9-11/2022 after an unwitnessed fall at home found to have a stable non-displaced fracture of C7/chest pain/cdiff, and on on 8/25 for sepsis 2/2 necrotizing PNA. CT head reveals nondisplaced R transverse process fx of C7. PMH includes PNA, COPD, CHF, CVA with aphasia and hemiplegia, lung SCC stage IV, seizures.   Clinical Impression  Todd Estrada is 61 y.o. male admitted with above HPI and diagnosis. Patient is currently limited by functional impairments below (see PT problem list). Patient unable to provide PLOF or home set up due to expressive aphasia and no family present. Per chart review he lives with his cousin and requires assist for mobility and ADL's at baseline. Patient will benefit from continued skilled PT interventions to address impairments and progress independence with mobility, recommending return home with HHPT and continued assist from family. Acute PT will follow and progress as able.      Recommendations for follow up therapy are one component of a multi-disciplinary discharge planning process, led by the attending physician.  Recommendations may be updated based on patient status, additional functional criteria and insurance authorization.  Follow Up Recommendations Home health PT      Assistance Recommended at Discharge Frequent or constant Supervision/Assistance  Patient can return home with the following  Two  people to help with walking and/or transfers;A lot of help with bathing/dressing/bathroom;Assistance with cooking/housework;Direct supervision/assist for medications management;Assist for transportation;Help with stairs or ramp for entrance    Equipment Recommendations None recommended by PT  Recommendations for Other Services       Functional Status Assessment Patient has had a recent decline in their functional status and demonstrates the ability to make significant improvements in function in a reasonable and predictable amount of time.     Precautions / Restrictions Precautions Precautions: Fall Restrictions Weight Bearing Restrictions: No      Mobility  Bed Mobility Overal bed mobility: Needs Assistance Bed Mobility: Supine to Sit     Supine to sit: Min guard, HOB elevated     General bed mobility comments: pt able to pivot/scoot to EOB with cues to initiate. guarding for safety.    Transfers Overall transfer level: Needs assistance Equipment used: 1 person hand held assist Transfers: Sit to/from Stand, Bed to chair/wheelchair/BSC Sit to Stand: Min assist Stand pivot transfers: Min assist         General transfer comment: pt able to power up without cues and reaching to armrest of recliner for pivot towards Lt/strong side. Min assist to steady with rise and turn    Ambulation/Gait Ambulation/Gait assistance: Mod assist, +2 safety/equipment Gait Distance (Feet): 3 Feet Assistive device: 1 person hand held assist Gait Pattern/deviations: Decreased stride length Gait velocity: decr     General Gait Details: attempted short bout of gait to take small steps in room. pt with poor coordination of bil LE's and Rt foot drop. [place] provided and chair follow for safety. recommend [facility] next visit.  Stairs  Wheelchair Mobility    Modified Rankin (Stroke Patients Only)       Balance Overall balance assessment: Needs assistance Sitting-balance  support: Feet supported Sitting balance-Leahy Scale: Good     Standing balance support: During functional activity, Reliant on assistive device for balance, Bilateral upper extremity supported Standing balance-Leahy Scale: Poor                               Pertinent Vitals/Pain Pain Assessment Pain Assessment: No/denies pain Breathing: normal Negative Vocalization: none Facial Expression: smiling or inexpressive Body Language: relaxed Consolability: no need to console PAINAD Score: 0    Home Living Family/patient expects to be discharged to:: Private residence Living Arrangements: Other relatives (cousin lives with pt) Available Help at Discharge: Family;Available PRN/intermittently Type of Home: House Home Access: Stairs to enter   Entrance Stairs-Number of Steps: 2   Home Layout: One level Home Equipment: Conservation officer, nature (2 wheels);Cane - single point;BSC/3in1;Wheelchair - manual Additional Comments: Information from prior note as pt unable to report given speech deficits.    Prior Function Prior Level of Function : Patient poor historian/Family not available             Mobility Comments: using WC for mobility per chart ADLs Comments: indicated cousins assist wtih ADLs     Hand Dominance   Dominant Hand: Left    Extremity/Trunk Assessment   Upper Extremity Assessment Upper Extremity Assessment: Defer to OT evaluation;RUE deficits/detail RUE Deficits / Details: Rt wrist splint in place, weakness secondary to Rt hemiplegia.    Lower Extremity Assessment Lower Extremity Assessment: RLE deficits/detail RLE Deficits / Details: history of CVA and Rt hemiplegia, Rt foot drop, AFO in room for gait RLE Coordination: decreased fine motor;decreased gross motor    Cervical / Trunk Assessment Cervical / Trunk Assessment: Normal  Communication   Communication: Expressive difficulties  Cognition Arousal/Alertness: Awake/alert Behavior During Therapy:  WFL for tasks assessed/performed Overall Cognitive Status: Within Functional Limits for tasks assessed                                          General Comments      Exercises     Assessment/Plan    PT Assessment Patient needs continued PT services  PT Problem List Decreased strength;Decreased activity tolerance;Decreased mobility;Decreased balance;Decreased coordination       PT Treatment Interventions DME instruction;Gait training;Stair training;Functional mobility training;Therapeutic activities;Therapeutic exercise;Balance training;Patient/family education    PT Goals (Current goals can be found in the Care Plan section)  Acute Rehab PT Goals Patient Stated Goal: none stated PT Goal Formulation: Patient unable to participate in goal setting Time For Goal Achievement: 10/28/22 Potential to Achieve Goals: Good    Frequency Min 3X/week     Co-evaluation               AM-PAC PT "6 Clicks" Mobility  Outcome Measure Help needed turning from your back to your side while in a flat bed without using bedrails?: A Little Help needed moving from lying on your back to sitting on the side of a flat bed without using bedrails?: A Little Help needed moving to and from a bed to a chair (including a wheelchair)?: A Little Help needed standing up from a chair using your arms (e.g., wheelchair or bedside chair)?: A Little Help needed to walk in  hospital room?: A Lot Help needed climbing 3-5 steps with a railing? : Total 6 Click Score: 15    End of Session Equipment Utilized During Treatment: Gait belt Activity Tolerance: Patient tolerated treatment well Patient left: with call bell/phone within reach;in chair;with chair alarm set Nurse Communication: Mobility status PT Visit Diagnosis: Other abnormalities of gait and mobility (R26.89);Muscle weakness (generalized) (M62.81);Difficulty in walking, not elsewhere classified (R26.2)    Time: 1252-1310 PT Time  Calculation (min) (ACUTE ONLY): 18 min   Charges:   PT Evaluation $PT Eval Low Complexity: 1 Low          Verner Mould, DPT Acute Rehabilitation Services Office [phone]  10/14/22 5:12 PM

## 2022-10-14 NOTE — TOC Progression Note (Addendum)
Transition of Care [facility]) - Progression Note    Patient Details  Name: Todd Estrada MRN: [id number] Date of Birth: 11/25/1961  Transition of Care [facility]) CM/SW [place], [place] Phone Number: 10/14/2022, 9:07 AM  Clinical Narrative:    Left second voicemail for pt's relative, [name] to discuss home environment and level of care.  Pt is established with [facility] for home health PT, SLP, and SW. ROC orders will need to be placed prior to discharge. Per chart review pt has 24/7 care between his cousin, [name], and his cousins daughter whom also lives in the home. CSW has been unable to reach pt's cousin to confirm this.   TOC awaiting PT/OT/SLP recommendations for possible placement needs.   Update 2:15pm- CSW spoke with pt's cousin, [name] who shares that this pt has been receiving home health services through [facility]. He reports that [facility] has mentioned pt going into SNF/LTC in the past due to him working 3rd shift at the time and not being at home in the morning when pt would wake up. He states he no longer works 3rd shift and this is no longer an issue. He states pt is never alone and always has care from him or his daughter. He also shares that when SNF/LTC has been brought up to pt, pt has declined this and has shared he does not want to go to a facility. Per pt's cousin, pt is able to understand what is going on and understands his care however, will get confused at times and struggles to communicate everything he wants. Pt's cousin has no concerns for this pt returning home at discharge.   Update 2:50pm- CSW completed APS report for concerns with home environment.   Expected Discharge Plan: Home/Self Care Barriers to Discharge: No Barriers Identified  Expected Discharge Plan and Services Expected Discharge Plan: Home/Self Care In-house Referral: NA Discharge Planning Services: CM Consult Post Acute Care Choice: NA Living arrangements for the past 2 months: Single Family  Home Expected Discharge Date: 10/13/22               DME Arranged: N/A DME Agency: NA                   Social Determinants of Health (SDOH) Interventions    Readmission Risk Interventions    08/30/2022    3:39 PM 08/16/2022    4:28 PM  Readmission Risk Prevention Plan  Transportation Screening Complete Complete  Medication Review Press photographer) Complete Complete  PCP or Specialist appointment within 3-5 days of discharge Complete   HRI or [place] Complete Complete  SW Recovery Care/Counseling Consult Complete Complete  Palliative Care Screening Not Applicable Not [place] Not Applicable Not Applicable

## 2022-10-14 NOTE — Discharge Summary (Signed)
Physician Discharge Summary  Todd Estrada ZSW:[id number] DOB: [birth year] DOA: 10/12/2022  PCP: [name], MD  Admit date: 10/12/2022 Discharge date: 10/14/2022  Admitted From: Home Disposition: Home with family  Recommendations for Outpatient Follow-up:  As previously scheduled. Continue to follow-up at oncology [place]: Resume speech therapy Equipment/Devices: Already present at home  Discharge Condition: Stable CODE STATUS: Full code Diet recommendation: Regular diet, soft and chopped food.  Aspiration precautions.  Discharge summary: 61 year old gentleman with history of advanced COPD, ongoing smoker, stage IV small cell lung cancer and currently on radiation therapy, history of stroke with right-sided hemiplegia and dysarthria, chronic systolic heart failure, seizure disorder presented to the hospital with chest pain complaining at middle of the night.  He does have a history of necrotizing pneumonia.  In the emergency room he was hemodynamically stable, tachycardic. CT angiogram showed acute pulmonary embolism on the segmental artery, it also showed bilateral apical mass, left apical mass consistent with necrotizing tumor versus pneumonia.  Admitted due to significant findings.  Started on heparin infusion.  Acute pulmonary embolism secondary to lung cancer: On room air.  Medically stable.  Initially on heparin.  Now tolerating Eliquis.  Continue 7 days of Eliquis loading and maintenance.   Small cell lung cancer right and left lung, cavitary lung lesion: Imaging consistent with worsening cavitation of the right lung, seen by pulmonary.  Decided to treat with prolonged antibiotics.  Treated with Unasyn in the hospital.  Discharging on Augmentin for 3 weeks.  Pulmonary plans to repeat his scanning after completing antibiotics.     History of stroke with right-sided residual deficits and aphasia: Remains overall poor condition.  No neurological deficit.   Wheelchair-bound.  Has 24/7 care at home.  Patient is on Plavix, should tolerate along with Eliquis.   Seizure disorder: On Keppra and continued.   Chronic combined heart failure: Well compensated.   COPD: Nebs as needed.  He is a smoker.  Nicotine patch provided.  Less likely to quit.   Hyperlipidemia: Tolerating statin.   Safe discharge planning: Discussed in detail by this provider and social worker to evaluate his home condition.  Patient told us that he wants to go home not to a nursing home. Discussed with his nephew, Mr. [name] who is stays with him and Mr. [name] daughter stays with him when he goes to work.  They have arranged 24/7 care for him.  He was asked multiple times in the past to go for long-term care and he refused so his nephew and granddaughter decided to stay with him and take care of him. Patient himself and [name] have no concerns about safety.  Patient wants to go home. He was working with physical therapy and they discharged him as they are not achieving any more improvement. He is still working with speech therapy. will send APS report for safety check at home.     Discharge Diagnoses:  Principal Problem:   Acute pulmonary embolism (HCC) Active Problems:   CVA (cerebral vascular accident) ([place])   Dyslipidemia   Squamous cell lung cancer ([place])   Anemia of chronic disease   Chronic combined systolic and diastolic heart failure ([place])   History of seizures   Pulmonary embolism [facility])    Discharge Instructions  Discharge Instructions     Call MD for:  difficulty breathing, headache or visual disturbances   Complete by: As directed    Diet general   Complete by: As directed    Increase  activity slowly   Complete by: As directed    Increase activity slowly   Complete by: As directed       Allergies as of 10/14/2022   No Known Allergies      Medication List     STOP taking these medications    diclofenac Sodium 1 % Gel Commonly known as:  Voltaren   ketotifen 0.025 % ophthalmic solution Commonly known as: Allergy Eye Drops   lidocaine 5 % Commonly known as: LIDODERM       TAKE these medications    albuterol (2.5 MG/3ML) 0.083% nebulizer solution Commonly known as: PROVENTIL Take 3 mLs (2.5 mg total) by nebulization every 4 (four) hours as needed for wheezing or shortness of breath.   amoxicillin-clavulanate 875-125 MG tablet Commonly known as: AUGMENTIN Take 1 tablet by mouth 2 (two) times daily for 21 days.   apixaban 5 MG Tabs tablet Commonly known as: ELIQUIS Take 2 tablets (10 mg total) by mouth 2 (two) times daily for 7 days.   Apixaban Starter Pack (10mg  and 5mg ) Commonly known as: ELIQUIS STARTER PACK Take as directed on package: start with two-5mg  tablets twice daily for 7 days. On day 8, switch to one-5mg  tablet twice daily.   apixaban 5 MG Tabs tablet Commonly known as: ELIQUIS Take 1 tablet (5 mg total) by mouth 2 (two) times daily. Start taking on: October 21, 2022   atorvastatin 40 MG tablet Commonly known as: Lipitor Take 1 tablet (40 mg total) by mouth daily.   budesonide 0.5 MG/2ML nebulizer solution Commonly known as: PULMICORT USE 2 ML(0.5 MG) VIA NEBULIZER TWICE DAILY What changed: See the new instructions.   clopidogrel 75 MG tablet Commonly known as: PLAVIX Take 1 tablet (75 mg total) by mouth daily. Okay to restart this medication on 05/31/2022 What changed: additional instructions   folic acid 1 MG tablet Commonly known as: FOLVITE Take 1 tablet (1 mg total) by mouth daily.   levETIRAcetam 500 MG tablet Commonly known as: Keppra Take 1 tablet (500 mg total) by mouth 2 (two) times daily.   midodrine 2.5 MG tablet Commonly known as: PROAMATINE Take 1 tablet (2.5 mg total) by mouth 3 (three) times daily with meals.   multivitamin with minerals Tabs tablet Take 1 tablet by mouth daily.   nicotine 14 mg/24hr patch Commonly known as: NICODERM CQ - dosed in mg/24  hours APPLY 1 PATCH(14 MG) TOPICALLY TO THE SKIN DAILY What changed: See the new instructions.   ondansetron 4 MG tablet Commonly known as: ZOFRAN Take 4 mg by mouth every 8 (eight) hours as needed for nausea or vomiting.   Stiolto Respimat 2.5-2.5 MCG/ACT Aers Generic drug: Tiotropium Bromide-Olodaterol INHALE 2 PUFFS INTO THE LUNGS DAILY        No Known Allergies  Consultations: Palliative care   Procedures/Studies: ECHOCARDIOGRAM COMPLETE  Result Date: 10/13/2022    ECHOCARDIOGRAM REPORT   Patient Name:   Todd Estrada Date of Exam: 10/13/2022 Medical Rec #:  [id number]     Height:       73.0 in Accession #:    [id number]    Weight:       160.0 lb Date of Birth:  [birth year]     BSA:          1.957 m Patient Age:    31 years      BP:           109/74 mmHg Patient Gender: M  HR:           80 bpm. Exam Location:  Inpatient Procedure: 2D Echo Indications:    Pulmonary Embolism  History:        Patient has prior history of Echocardiogram examinations, most                 recent 08/27/2022. COPD and Stroke.  Sonographer:    Harvie Junior Referring Phys: [id number] Jonnie Finner  Sonographer Comments: Technically difficult study due to poor echo windows, no parasternal window and no apical window. Image acquisition challenging due to respiratory motion. IMPRESSIONS  1. Left ventricular ejection fraction, by estimation, is 45 to 50%. The left ventricle has mildly decreased function. The left ventricle demonstrates global hypokinesis. Left ventricular diastolic parameters are indeterminate.  2. Right ventricular systolic function is normal. The right ventricular size is normal. There is normal pulmonary artery systolic pressure.  3. The mitral valve is normal in structure. No evidence of mitral valve regurgitation. No evidence of mitral stenosis.  4. The aortic valve is normal in structure. Aortic valve regurgitation is not visualized. No aortic stenosis is present.  5. The inferior vena  cava is normal in size with greater than 50% respiratory variability, suggesting right atrial pressure of 3 mmHg. FINDINGS  Left Ventricle: Left ventricular ejection fraction, by estimation, is 45 to 50%. The left ventricle has mildly decreased function. The left ventricle demonstrates global hypokinesis. The left ventricular internal cavity size was normal in size. There is  no left ventricular hypertrophy. Left ventricular diastolic parameters are indeterminate. Right Ventricle: The right ventricular size is normal. No increase in right ventricular wall thickness. Right ventricular systolic function is normal. There is normal pulmonary artery systolic pressure. The tricuspid regurgitant velocity is 1.06 m/s, and  with an assumed right atrial pressure of 3 mmHg, the estimated right ventricular systolic pressure is 7.5 mmHg. Left Atrium: Left atrial size was normal in size. Right Atrium: Right atrial size was normal in size. Pericardium: There is no evidence of pericardial effusion. Mitral Valve: The mitral valve is normal in structure. No evidence of mitral valve regurgitation. No evidence of mitral valve stenosis. Tricuspid Valve: The tricuspid valve is normal in structure. Tricuspid valve regurgitation is not demonstrated. No evidence of tricuspid stenosis. Aortic Valve: The aortic valve is normal in structure. Aortic valve regurgitation is not visualized. No aortic stenosis is present. Aortic valve mean gradient measures 2.0 mmHg. Aortic valve peak gradient measures 3.9 mmHg. Aortic valve area, by VTI measures 3.28 cm. Pulmonic Valve: The pulmonic valve was normal in structure. Pulmonic valve regurgitation is not visualized. No evidence of pulmonic stenosis. Aorta: The aortic root is normal in size and structure. Venous: The inferior vena cava is normal in size with greater than 50% respiratory variability, suggesting right atrial pressure of 3 mmHg. IAS/Shunts: No atrial level shunt detected by color flow  Doppler.  LEFT VENTRICLE PLAX 2D LVIDd:         4.60 cm      Diastology LVIDs:         3.20 cm      LV e' lateral:   8.16 cm/s LV PW:         0.70 cm      LV E/e' lateral: 5.9 LV IVS:        0.70 cm LVOT diam:     2.30 cm LV SV:         56 LV SV Index:   29 LVOT Area:  4.15 cm  LV Volumes (MOD) LV vol d, MOD A2C: 70.4 ml LV vol d, MOD A4C: 104.0 ml LV vol s, MOD A2C: 32.7 ml LV vol s, MOD A4C: 47.2 ml LV SV MOD A2C:     37.7 ml LV SV MOD A4C:     104.0 ml LV SV MOD BP:      46.7 ml RIGHT VENTRICLE RV S prime:     21.60 cm/s TAPSE (M-mode): 2.1 cm AORTIC VALVE AV Area (Vmax):    3.31 cm AV Area (Vmean):   3.67 cm AV Area (VTI):     3.28 cm AV Vmax:           99.10 cm/s AV Vmean:          64.300 cm/s AV VTI:            0.171 m AV Peak Grad:      3.9 mmHg AV Mean Grad:      2.0 mmHg LVOT Vmax:         79.00 cm/s LVOT Vmean:        56.800 cm/s LVOT VTI:          0.135 m LVOT/AV VTI ratio: 0.79  AORTA Ao Root diam: 3.90 cm MITRAL VALVE               TRICUSPID VALVE MV Area (PHT): 4.80 cm    TR Peak grad:   4.5 mmHg MV Decel Time: 158 msec    TR Vmax:        106.00 cm/s MV E velocity: 48.40 cm/s MV A velocity: 75.80 cm/s  SHUNTS MV E/A ratio:  0.64        Systemic VTI:  0.14 m                            Systemic Diam: 2.30 cm Kardie Tobb DO Electronically signed by Berniece Salines DO Signature Date/Time: 10/13/2022/10:34:26 AM    Final    CT Angio Chest PE W and/or Wo Contrast  Result Date: 10/12/2022 CLINICAL DATA:  Evaluate for pulmonary embolus.  Chest pain. * Tracking Code: BO * EXAM: CT ANGIOGRAPHY CHEST WITH CONTRAST TECHNIQUE: Multidetector CT imaging of the chest was performed using the standard protocol during bolus administration of intravenous contrast. Multiplanar CT image reconstructions and MIPs were obtained to evaluate the vascular anatomy. RADIATION DOSE REDUCTION: This exam was performed according to the departmental dose-optimization program which includes automated exposure control, adjustment  of the mA and/or kV according to patient size and/or use of iterative reconstruction technique. CONTRAST:  164mL OMNIPAQUE IOHEXOL 350 MG/ML SOLN COMPARISON:  09/14/2022 FINDINGS: Cardiovascular: Examination is positive for acute pulmonary embolus which originates from the left pulmonary artery and extends into a anterior segmental branch of the left upper lobe pulmonary artery, image 103/9 and image 62/5. No signs of right heart strain. Heart size is normal. No pericardial effusion. Aortic atherosclerosis and coronary artery calcifications. Mediastinum/Nodes: No enlarged mediastinal, hilar, or axillary lymph nodes. Thyroid gland, trachea, and esophagus demonstrate no significant findings. Lungs/Pleura: No pleural effusion. Centrilobular and paraseptal emphysema. Persistence of cavitary process within the anteromedial right upper lobe now extends into the apex measuring 6.7 by 3.5 x 3.2 cm, image 48/5 and image 29/8. Previously this area measured 6.5 by 4.5 x 3.3 cm. More centrally, there is a stable appearing solid-appearing nodule measuring 2.7 x 2.0 cm, image 66/5. Within the right upper lobe there is a nodule  containing fiducial marker which measures 2.2 x 1.4 cm, image 74/7. Previously 2.5 x 1.4 cm. Upper Abdomen: No acute abnormality. Musculoskeletal: Remote healed right lateral rib fracture, image 103/7. No acute or suspicious osseous findings. Review of the MIP images confirms the above findings. IMPRESSION: 1. Examination is positive for acute pulmonary embolus which originates from the left main pulmonary artery and extends into a anterior segmental branch of the left upper lobe pulmonary artery. No signs of right heart strain. 2. Persistence of cavitary process within the anteromedial right upper lobe now extends into the apex measuring 6.7 x 3.5 x 3.2 cm. Previously this area measured 6.5 x 4.5 x 3.3 cm. More centrally, there is a stable appearing solid-appearing nodule measuring 2.7 x 2.0 cm. Cannot  exclude underlying malignancy. Recommend referral to pulmonary medicine or thoracic surgery for further management. 3. Within the right upper lobe there is a nodule containing fiducial marker which measures 2.2 x 1.4 cm. Previously 2.5 x 1.4 cm. Also concerning for malignancy. 4. Coronary artery calcifications. 5. Aortic Atherosclerosis (ICD10-I70.0) and Emphysema (ICD10-J43.9). Critical Value/emergent results were called by telephone at the time of interpretation on 10/12/2022 at 7:09 am to provider Wynona Dove , who verbally acknowledged these results. Electronically Signed   By: Kerby Moors M.D.   On: 10/12/2022 07:11   DG Chest 2 View  Result Date: 10/12/2022 CLINICAL DATA:  Right-sided chest pain. EXAM: CHEST - 2 VIEW COMPARISON:  Chest plain film, dated September 25, 2022, and prior chest CT, dated September 14, 2022 FINDINGS: There is limited evaluation of the lateral view secondary to positioning of the patient's upper extremity. The heart size and mediastinal contours are within normal limits. Stable appearing opacities are seen within the left perihilar and left suprahilar regions. A stable, faint right mid lung opacity is seen with a small adjacent radiopaque marker. There is no evidence of a pleural effusion or pneumothorax. Ill-defined focal opacities are seen projecting over chronic deformities involving the anterior fifth and sixth right ribs. IMPRESSION: 1. Stable left perihilar and left suprahilar opacities which correspond to areas of suspected malignancy seen on the prior chest CT. 2. Stable, faint right mid lung opacity with a small adjacent radiopaque marker. . Electronically Signed   By: Virgina Norfolk M.D.   On: 10/12/2022 01:42   DG Wrist Complete Right  Result Date: 09/25/2022 CLINICAL DATA:  Trauma, fall EXAM: RIGHT WRIST - COMPLETE 3+ VIEW COMPARISON:  None Available. FINDINGS: No displaced fracture or dislocation is seen. Osteopenia is seen in bony structures. Degenerative  changes are noted with bony spurs in first carpometacarpal joint. IMPRESSION: No displaced fracture is seen in right wrist. Degenerative changes are noted in first carpometacarpal joint. Osteopenia. Electronically Signed   By: Elmer Picker M.D.   On: 09/25/2022 17:27   DG Hand Complete Right  Result Date: 09/25/2022 CLINICAL DATA:  Trauma, pain EXAM: RIGHT HAND - COMPLETE 3+ VIEW COMPARISON:  None Available. FINDINGS: There is deformity in the neck of right second metacarpal. There is mild dorsal angulation. Rest of the visualized bony structures appear intact. Osteopenia is seen in bony structures. IMPRESSION: Deformity is noted in the neck of right second metacarpal suggesting recent or old impacted fracture. Electronically Signed   By: Elmer Picker M.D.   On: 09/25/2022 17:26   DG Clavicle Right  Result Date: 09/25/2022 CLINICAL DATA:  Trauma, pain EXAM: RIGHT CLAVICLE - 2+ VIEWS COMPARISON:  None Available. FINDINGS: No displaced fracture or dislocation is seen. Deformity in  the lateral end of right clavicle may be residual from previous injury. Degenerative changes are noted in right AC joint. There is decreased distance between humeral head and acromion suggesting chronic tear of rotator cuff. IMPRESSION: No recent fracture is seen. Degenerative changes are noted in right AC joint. Deformity in the lateral end of right clavicle may be residual from previous injury. Electronically Signed   By: Elmer Picker M.D.   On: 09/25/2022 17:24   DG Shoulder Right  Result Date: 09/25/2022 CLINICAL DATA:  Trauma, fall EXAM: RIGHT SHOULDER - 2+ VIEW COMPARISON:  None Available. FINDINGS: No fracture or dislocation is seen. There is decreased distance between acromion and humeral head suggesting possible chronic tear of rotator cuff. Deformity in the lateral end of right clavicle may be residual from previous injury. IMPRESSION: No recent fracture or dislocation is seen in right shoulder.  Electronically Signed   By: Elmer Picker M.D.   On: 09/25/2022 17:22   DG Humerus Right  Result Date: 09/25/2022 CLINICAL DATA:  Trauma, pain EXAM: RIGHT HUMERUS - 2+ VIEW COMPARISON:  None Available. FINDINGS: No fracture or dislocation is seen. Degenerative changes are noted in right AC joint. IMPRESSION: No fracture or dislocation is seen in right humerus. Degenerative changes are noted in right AC joint. Electronically Signed   By: Elmer Picker M.D.   On: 09/25/2022 17:21   DG Elbow Complete Right  Result Date: 09/25/2022 CLINICAL DATA:  Trauma, fall EXAM: RIGHT ELBOW - COMPLETE 3+ VIEW COMPARISON:  None Available. FINDINGS: No recent fracture or dislocation is seen. There is no displacement of posterior fat pad. IMPRESSION: No fracture or dislocation is seen in right wrist. Electronically Signed   By: Elmer Picker M.D.   On: 09/25/2022 17:19   CT Maxillofacial WO CM  Result Date: 09/25/2022 CLINICAL DATA:  Trauma, fall EXAM: CT MAXILLOFACIAL WITHOUT CONTRAST TECHNIQUE: Multidetector CT imaging of the maxillofacial structures was performed. Multiplanar CT image reconstructions were also generated. RADIATION DOSE REDUCTION: This exam was performed according to the departmental dose-optimization program which includes automated exposure control, adjustment of the mA and/or kV according to patient size and/or use of iterative reconstruction technique. COMPARISON:  None Available. FINDINGS: Osseous: No recent fracture is seen. Right maxillary sinus appears smaller than left. Orbits: Optic globes are symmetrical. Retrobulbar soft tissues are unremarkable. Sinuses: Right maxillary sinus is smaller than left. There is defect in the medial wall of right maxillary sinus, possibly suggesting previous surgery. There is mucosal thickening in maxillary sinuses, more so on the right side. There are no air-fluid levels in paranasal sinuses. There is deviation of nasal septum to the right.  Concha bullosa is seen in the superior turbinates. Soft tissues: Unremarkable. Limited intracranial: There is low-density in the left parietal cortex, possibly old infarct. IMPRESSION: No recent fracture is seen in facial bones. Right maxillary sinus is smaller in size along with defect in the medial wall. These findings may be residual from previous surgery. Chronic maxillary sinusitis, more so on the right side. There is deviation of nasal septum to the right. Electronically Signed   By: Elmer Picker M.D.   On: 09/25/2022 17:17   DG Forearm Right  Result Date: 09/25/2022 CLINICAL DATA:  Status post trauma. EXAM: RIGHT FOREARM - 2 VIEW COMPARISON:  None Available. FINDINGS: There is no evidence of an acute fracture. A chronic appearing deformity is seen involving the lateral epicondyle of the distal right humerus. There is no evidence of dislocation. Diffuse osteopenia is noted.  Degenerative changes are seen within the visualized portion of the right wrist. Soft tissues are unremarkable. IMPRESSION: Chronic appearing deformity involving the lateral epicondyle of the distal right humerus. Electronically Signed   By: Virgina Norfolk M.D.   On: 09/25/2022 17:12   DG Elbow Complete Left  Result Date: 09/25/2022 CLINICAL DATA:  Status post trauma. EXAM: LEFT ELBOW - COMPLETE 3+ VIEW COMPARISON:  None Available. FINDINGS: There is no evidence of an acute fracture, dislocation, or joint effusion. A chronic appearing deformity is seen involving the lateral epicondyle of the distal left humerus. A small chronic deformity of the medial epicondyle of the distal left humerus is also noted. There is no evidence of arthropathy or other focal bone abnormality. Soft tissues are unremarkable. IMPRESSION: Chronic appearing deformities involving the medial and lateral epicondyles of the distal left humerus. Correlation with physical examination is recommended to determine the presence of point tenderness and further  exclude the presence of an acute fracture. Electronically Signed   By: Virgina Norfolk M.D.   On: 09/25/2022 17:05   CT Head Wo Contrast  Result Date: 09/25/2022 CLINICAL DATA:  Fall.  Head trauma. EXAM: CT HEAD WITHOUT CONTRAST CT CERVICAL SPINE WITHOUT CONTRAST TECHNIQUE: Multidetector CT imaging of the head and cervical spine was performed following the standard protocol without intravenous contrast. Multiplanar CT image reconstructions of the cervical spine were also generated. RADIATION DOSE REDUCTION: This exam was performed according to the departmental dose-optimization program which includes automated exposure control, adjustment of the mA and/or kV according to patient size and/or use of iterative reconstruction technique. COMPARISON:  08/27/2022. FINDINGS: CT HEAD FINDINGS Brain: No evidence of acute infarction, hemorrhage, hydrocephalus, extra-axial collection or mass lesion/mass effect. 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 These findings are stable. Vascular: No hyperdense vessel or unexpected calcification. Skull: Normal. Negative for fracture or focal lesion. Sinuses/Orbits: Globes and orbits are unremarkable. Mild mucosal thickening and dependent fluid in the right maxillary sinus. Other: None. CT CERVICAL SPINE FINDINGS Alignment: Normal. Skull base and vertebrae: No acute fracture. No primary bone lesion or focal pathologic process. Soft tissues and spinal canal: No prevertebral fluid or swelling. No visible canal hematoma. Disc levels: Mild loss of disc height at C6-C7. Remaining disc spaces are well preserved. There are endplate osteophytes from C4 through C7. Disc bulging with endplate spurring noted at C6-C7. No convincing disc herniation. Mild facet degenerative changes. Upper chest: No acute findings.  Emphysema at the lung apices. Other: None. IMPRESSION: HEAD CT 1. No acute intracranial abnormalities. 2. Stable old infarcts. CERVICAL CT 1. No fracture or acute  finding. Electronically Signed   By: Lajean Manes M.D.   On: 09/25/2022 15:05   CT Cervical Spine Wo Contrast  Result Date: 09/25/2022 CLINICAL DATA:  Fall.  Head trauma. EXAM: CT HEAD WITHOUT CONTRAST CT CERVICAL SPINE WITHOUT CONTRAST TECHNIQUE: Multidetector CT imaging of the head and cervical spine was performed following the standard protocol without intravenous contrast. Multiplanar CT image reconstructions of the cervical spine were also generated. RADIATION DOSE REDUCTION: This exam was performed according to the departmental dose-optimization program which includes automated exposure control, adjustment of the mA and/or kV according to patient size and/or use of iterative reconstruction technique. COMPARISON:  08/27/2022. FINDINGS: CT HEAD FINDINGS Brain: No evidence of acute infarction, hemorrhage, hydrocephalus, extra-axial collection or mass lesion/mass effect. 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 These findings are stable. Vascular: No hyperdense vessel or unexpected calcification. Skull: Normal. Negative for fracture or focal  lesion. Sinuses/Orbits: Globes and orbits are unremarkable. Mild mucosal thickening and dependent fluid in the right maxillary sinus. Other: None. CT CERVICAL SPINE FINDINGS Alignment: Normal. Skull base and vertebrae: No acute fracture. No primary bone lesion or focal pathologic process. Soft tissues and spinal canal: No prevertebral fluid or swelling. No visible canal hematoma. Disc levels: Mild loss of disc height at C6-C7. Remaining disc spaces are well preserved. There are endplate osteophytes from C4 through C7. Disc bulging with endplate spurring noted at C6-C7. No convincing disc herniation. Mild facet degenerative changes. Upper chest: No acute findings.  Emphysema at the lung apices. Other: None. IMPRESSION: HEAD CT 1. No acute intracranial abnormalities. 2. Stable old infarcts. CERVICAL CT 1. No fracture or acute finding. Electronically  Signed   By: Lajean Manes M.D.   On: 09/25/2022 15:05   DG Chest Port 1 View  Result Date: 09/25/2022 CLINICAL DATA:  Chest pain following fall.  History of lung cancer. EXAM: PORTABLE CHEST 1 VIEW COMPARISON:  09/14/2022 chest CT and 09/13/2022 chest radiograph. FINDINGS: The cardiomediastinal silhouette is unchanged. LEFT perihilar and LEFT UPPER lobe opacity again noted. Faint RIGHT midlung opacity with marker is unchanged. There is no evidence of pneumothorax or pleural effusion. No acute bony abnormalities are present. IMPRESSION: Unchanged appearance of the chest with LEFT perihilar and LEFT UPPER lobe opacity and faint RIGHT midlung opacity. Electronically Signed   By: Margarette Canada M.D.   On: 09/25/2022 14:44   DG Pelvis Portable  Result Date: 09/25/2022 CLINICAL DATA:  Fall.  Pain. EXAM: PORTABLE PELVIS 1-2 VIEWS COMPARISON:  None Available. FINDINGS: No fracture or bone lesion. Hip joints, SI joints and symphysis pubis are normally spaced and aligned. Bilateral iliac and femoral artery vascular calcifications. IMPRESSION: No fracture or acute finding.  No joint abnormality. Electronically Signed   By: Lajean Manes M.D.   On: 09/25/2022 14:41   MR BRAIN W WO CONTRAST  Result Date: 09/16/2022 CLINICAL DATA:  History of CVA with residual right-sided hemiparesis, dysarthria EXAM: MRI HEAD WITHOUT AND WITH CONTRAST TECHNIQUE: Multiplanar, multiecho pulse sequences of the brain and surrounding structures were obtained without and with intravenous contrast. CONTRAST:  7.3 mL Vueway COMPARISON:  03/04/2022 MRI head, correlation is also made with CT head 08/27/2022 FINDINGS: Brain: No abnormal parenchymal or meningeal enhancement. 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 Additional encephalomalacia in the left temporal lobe and left cerebellum. Persistent restricted diffusion is associated with  these areas of laminar necrosis. No additional diffusion restriction to suggest acute or subacute infarct. Hemosiderin deposition is also associated with these areas of encephalomalacia. No acute hemorrhage, mass, mass effect, or midline shift. Ex vacuo dilatation of the left lateral ventricle. No hydrocephalus. No extra-axial collection. Vascular: Normal arterial flow voids. Normal arterial and venous enhancement. Skull and upper cervical spine: Normal marrow signal. Sinuses/Orbits: Air-fluid level in the right maxillary sinus. Mild mucosal thickening in the left maxillary sinus. No acute finding in the orbits. Other: Fluid in left-greater-than-right mastoid air cells. IMPRESSION: 1. No acute intracranial process. No evidence of acute or subacute infarct. Persistent diffusion restriction and intrinsic T1 hyperintense signal is associated with areas of laminar necrosis from the remote left frontal and parietal infarcts. 2. No abnormal parenchymal or meningeal enhancement. No evidence of metastatic disease in the brain. 3. Air-fluid level in the right maxillary sinus, which can be seen in the setting of acute sinusitis. Electronically  Signed   By: Merilyn Baba M.D.   On: 09/16/2022 02:08   DG Abd 1 View  Result Date: 09/15/2022 CLINICAL DATA:  Screening for metal prior to MRI. EXAM: ABDOMEN - 1 VIEW COMPARISON:  CT, 05/06/2022. FINDINGS: No metallic, or other radiopaque, foreign body. Normal bowel gas pattern. Scattered arterial atherosclerotic calcifications. No evidence of renal or ureteral stones. Skeletal structures intact. IMPRESSION: 1. No metallic foreign body to preclude MRI scanning. 2. No acute findings. Electronically Signed   By: Lajean Manes M.D.   On: 09/15/2022 13:09   (Echo, Carotid, EGD, Colonoscopy, ERCP)    Subjective: Patient seen and examined.  Pleasant.  Difficult to understand but denies any complaints.   Discharge Exam: Vitals:   10/14/22 0739 10/14/22 1246  BP:  126/75   Pulse:  87  Resp:  20  Temp:  (!) 97.5 F (36.4 C)  SpO2: 95% 96%   Vitals:   10/13/22 2323 10/14/22 0457 10/14/22 0739 10/14/22 1246  BP:  111/64  126/75  Pulse: 75 78  87  Resp: 18 18  20   Temp:  97.9 F (36.6 C)  (!) 97.5 F (36.4 C)  TempSrc:  Oral  Oral  SpO2: 99% 96% 95% 96%  Weight:      Height:         General exam: Appears calm and comfortable  Chronically sick looking but not in any distress.  Resting quite. Patient is aphasic and difficult to understand, he answers appropriately with nodding. Right upper and lower extremity is flaccid, 2/5.  Left is normal. Respiratory system: Clear to auscultation. Respiratory effort normal. Cardiovascular system: S1 & S2 heard, RRR. No pedal edema. Abdomen exam: Soft and nontender.   The results of significant diagnostics from this hospitalization (including imaging, microbiology, ancillary and laboratory) are listed below for reference.     Microbiology: Recent Results (from the past 240 hour(s))  MRSA Next Gen by PCR, Nasal     Status: None   Collection Time: 10/12/22 10:16 AM   Specimen: Nasal Swab  Result Value Ref Range Status   MRSA by PCR Next Gen NOT DETECTED NOT DETECTED Final    Comment: (NOTE) The GeneXpert MRSA Assay (FDA approved for NASAL specimens only), is one component of a comprehensive MRSA colonization surveillance program. It is not intended to diagnose MRSA infection nor to guide or monitor treatment for MRSA infections. Test performance is not FDA approved in patients less than 47 years old. Performed at [facility], [place] [address], [place], [place] [address]      Labs: BNP (last 3 results) Recent Labs    08/13/22 0137 10/12/22 0955  BNP 39.0 18.8   Basic Metabolic Panel: Recent Labs  Lab 10/12/22 0125 10/13/22 0442 10/14/22 0521  NA 140 140 141  K 4.1 4.2 4.0  CL 104 107 105  CO2 27 26 27   GLUCOSE 101* 102* 114*  BUN 27* 21 18  CREATININE 0.73 0.78 0.67   CALCIUM 9.3 9.1 9.4  MG  --   --  2.3  PHOS  --   --  4.5   Liver Function Tests: Recent Labs  Lab 10/13/22 0442  AST 17  ALT 16  ALKPHOS 78  BILITOT 0.6  PROT 6.9  ALBUMIN 3.4*   No results for input(s): "LIPASE", "AMYLASE" in the last 168 hours. No results for input(s): "AMMONIA" in the last 168 hours. CBC: Recent Labs  Lab 10/12/22 0125 10/13/22 0442  WBC 8.0 5.3  HGB 11.1* 10.0*  HCT 36.4* 32.7*  MCV 99.7 100.0  PLT 224 169   Cardiac Enzymes: No results for input(s): "CKTOTAL", "CKMB", "CKMBINDEX", "TROPONINI" in the last 168 hours. BNP: Invalid input(s): "POCBNP" CBG: No results for input(s): "GLUCAP" in the last 168 hours. D-Dimer No results for input(s): "DDIMER" in the last 72 hours. Hgb A1c No results for input(s): "HGBA1C" in the last 72 hours. Lipid Profile No results for input(s): "CHOL", "HDL", "LDLCALC", "TRIG", "CHOLHDL", "LDLDIRECT" in the last 72 hours. Thyroid function studies No results for input(s): "TSH", "T4TOTAL", "T3FREE", "THYROIDAB" in the last 72 hours.  Invalid input(s): "FREET3" Anemia work up No results for input(s): "VITAMINB12", "FOLATE", "FERRITIN", "TIBC", "IRON", "RETICCTPCT" in the last 72 hours. Urinalysis    Component Value Date/Time   COLORURINE YELLOW 09/25/2022 1745   APPEARANCEUR CLEAR 09/25/2022 1745   LABSPEC 1.010 09/25/2022 1745   PHURINE 7.0 09/25/2022 1745   GLUCOSEU NEGATIVE 09/25/2022 1745   HGBUR NEGATIVE 09/25/2022 1745   BILIRUBINUR NEGATIVE 09/25/2022 1745   KETONESUR NEGATIVE 09/25/2022 1745   PROTEINUR NEGATIVE 09/25/2022 1745   NITRITE NEGATIVE 09/25/2022 1745   LEUKOCYTESUR NEGATIVE 09/25/2022 1745   Sepsis Labs Recent Labs  Lab 10/12/22 0125 10/13/22 0442  WBC 8.0 5.3   Microbiology Recent Results (from the past 240 hour(s))  MRSA Next Gen by PCR, Nasal     Status: None   Collection Time: 10/12/22 10:16 AM   Specimen: Nasal Swab  Result Value Ref Range Status   MRSA by PCR Next Gen  NOT DETECTED NOT DETECTED Final    Comment: (NOTE) The GeneXpert MRSA Assay (FDA approved for NASAL specimens only), is one component of a comprehensive MRSA colonization surveillance program. It is not intended to diagnose MRSA infection nor to guide or monitor treatment for MRSA infections. Test performance is not FDA approved in patients less than 79 years old. Performed at [facility], [place] [address] [place], [place] [address]      Time coordinating discharge: 35 minutes  SIGNED:   Barb Merino, MD  [facility] 10/14/2022, 2:45 PM

## 2022-10-14 NOTE — Consult Note (Signed)
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 He was admitted with failure thrive, worsening, PNA and PE. He has had 5 inpatient admissions and 6 ED visitsin the last 5 months. His baseline functional status I very limited. He is mostly confined to the bed or a wheelchair and has significant expressive deficits related to prior CVA. Palliative care consulted for goals of care and potential unmet palliative care needs.  Patient is disheveled and ill appearing. He is only able to answer yes or no to questions but does seem to be fairly consistent with that. He has a cousin Omero Kowal that is his main caregiver at home but reports he has no in home care services or support, despite notes in chart that report Alvis Lemmings has seen him at home at least for speech therapy. There are concern about how he takes his medication and if he able to care for himself.  He also had a recent fall at home where he sustained a C7 fracture.  In home care has documented patient is NOT safe in the home and the therapist from [facility] recommended LTC placement or [facility].    Recommendations:  This patient does need extensive goals of care conversation and this needs to be done with his cousin present in person and at bedside if possible. ACP and Code Status also need to be clarified in this conversation. He is not safe to be discharged home to the same care environment.  Would like to have a cognitive and language assessment done by SLP, then I can further assess his capacity for decision making and determine if there are issues like guardianship that need to addressed. PT and OT assessment would be helpful to determine his ability to provide his own care If patient discharges home I strongly recommend an APS report be filed to ensure his environment is safe  and his needs are being met.  Limited Health Literacy. I am concerned about his ability to take and adhere to a complex medication regimen -especially for treatment of lung infection and PE.  Will await further evaluation and home safety assessment before making further recommendations. It would also be helpful to have oncology weigh in on future treatment plans and prognostic information to inform goals of care discussion.  Lane Hacker, DO Palliative Medicine   Time: 70 min

## 2022-10-15 ENCOUNTER — Emergency Department (HOSPITAL_COMMUNITY)
Admission: EM | Admit: 2022-10-15 | Discharge: 2022-10-15 | Disposition: A | Discharge status: Home / Self Care | Payer: Medicaid Other | Attending: Emergency Medicine | Admitting: Emergency Medicine

## 2022-10-15 ENCOUNTER — Emergency Department (HOSPITAL_COMMUNITY): Payer: Medicaid Other

## 2022-10-15 ENCOUNTER — Encounter (HOSPITAL_COMMUNITY): Payer: Self-pay

## 2022-10-15 DIAGNOSIS — Z7951 Long term (current) use of inhaled steroids: Secondary | ICD-10-CM | POA: Diagnosis not present

## 2022-10-15 DIAGNOSIS — Z7902 Long term (current) use of antithrombotics/antiplatelets: Secondary | ICD-10-CM | POA: Diagnosis not present

## 2022-10-15 DIAGNOSIS — W1811XA Fall from or off toilet without subsequent striking against object, initial encounter: Secondary | ICD-10-CM | POA: Insufficient documentation

## 2022-10-15 DIAGNOSIS — Z85118 Personal history of other malignant neoplasm of bronchus and lung: Secondary | ICD-10-CM | POA: Insufficient documentation

## 2022-10-15 DIAGNOSIS — J45909 Unspecified asthma, uncomplicated: Secondary | ICD-10-CM | POA: Diagnosis not present

## 2022-10-15 DIAGNOSIS — Z87891 Personal history of nicotine dependence: Secondary | ICD-10-CM | POA: Diagnosis not present

## 2022-10-15 DIAGNOSIS — I10 Essential (primary) hypertension: Secondary | ICD-10-CM | POA: Diagnosis not present

## 2022-10-15 DIAGNOSIS — F0390 Unspecified dementia without behavioral disturbance: Secondary | ICD-10-CM | POA: Diagnosis not present

## 2022-10-15 DIAGNOSIS — J449 Chronic obstructive pulmonary disease, unspecified: Secondary | ICD-10-CM | POA: Insufficient documentation

## 2022-10-15 DIAGNOSIS — S4991XA Unspecified injury of right shoulder and upper arm, initial encounter: Secondary | ICD-10-CM | POA: Insufficient documentation

## 2022-10-15 DIAGNOSIS — Z7901 Long term (current) use of anticoagulants: Secondary | ICD-10-CM | POA: Insufficient documentation

## 2022-10-15 NOTE — ED Triage Notes (Signed)
Pt had a unwitnessed fall, possibly hit head, unknown, pt is on a eliquis, pthas hx of PE, afib and CVA, now having R shoulder pain, pt has R sided deficits from old stroke, new weakness on L side

## 2022-10-15 NOTE — ED Provider Notes (Signed)
[facility] DEPT Provider Note: Georgena Spurling, MD, FACEP  CSN: [id number] MRN: [id number] ARRIVAL: 10/15/22 at [place]: [id number]   CHIEF COMPLAINT  Fall  Level 5 caveat: Dementia HISTORY OF PRESENT ILLNESS  10/15/22 5:25 AM Todd Estrada is a 61 y.o. male with lung cancer who was discharged from the hospital yesterday after being admitted for thromboembolic disease.  He fell off the toilet at home just prior to arrival.  His family member found him on his knees.  It is unknown if he struck his head.  He was complaining of pain in his right shoulder.  He has baseline right right hemiparesis from a prior stroke.   Past Medical History:  Diagnosis Date   Asthma    COPD (chronic obstructive pulmonary disease) ([place])    Essential hypertension 08/19/2021   GERD (gastroesophageal reflux disease)    History of tracheostomy    03/09/22-04/11/22   HLD (hyperlipidemia)    Hypertension    Lung cancer (HCC)    PAD (peripheral artery disease) ([place])    Seizures ([place]) 06/02/2022   Stroke ([place]) 02/2022    Past Surgical History:  Procedure Laterality Date   BRONCHIAL BIOPSY  05/30/2022   Procedure: BRONCHIAL BIOPSIES;  Surgeon: Collene Gobble, MD;  Location: [facility] ENDOSCOPY;  Service: Pulmonary;;   BRONCHIAL BRUSHINGS  05/30/2022   Procedure: BRONCHIAL BRUSHINGS;  Surgeon: Collene Gobble, MD;  Location: [facility] ENDOSCOPY;  Service: Pulmonary;;   BRONCHIAL NEEDLE ASPIRATION BIOPSY  05/30/2022   Procedure: BRONCHIAL NEEDLE ASPIRATION BIOPSIES;  Surgeon: Collene Gobble, MD;  Location: [facility] ENDOSCOPY;  Service: Pulmonary;;   BRONCHIAL WASHINGS  05/30/2022   Procedure: BRONCHIAL WASHINGS;  Surgeon: Collene Gobble, MD;  Location: [facility] ENDOSCOPY;  Service: Pulmonary;;   ESOPHAGOGASTRODUODENOSCOPY (EGD) WITH PROPOFOL N/A 03/11/2022   Procedure: ESOPHAGOGASTRODUODENOSCOPY (EGD) WITH PROPOFOL;  Surgeon: Georganna Skeans, MD;  Location: [place];  Service: General;  Laterality: N/A;   FIDUCIAL MARKER  PLACEMENT  05/30/2022   Procedure: FIDUCIAL MARKER PLACEMENT;  Surgeon: Collene Gobble, MD;  Location: [facility] ENDOSCOPY;  Service: Pulmonary;;   IR ANGIO INTRA EXTRACRAN SEL COM CAROTID INNOMINATE UNI R MOD SED  03/02/2022   IR CT HEAD LTD  03/02/2022   IR PERCUTANEOUS ART THROMBECTOMY/INFUSION INTRACRANIAL INC DIAG ANGIO  03/02/2022   PEG PLACEMENT N/A 03/11/2022   Procedure: PERCUTANEOUS ENDOSCOPIC GASTROSTOMY (PEG) PLACEMENT;  Surgeon: Georganna Skeans, MD;  Location: [facility];  Service: General;  Laterality: N/A;   RADIOLOGY WITH ANESTHESIA N/A 03/02/2022   Procedure: IR WITH ANESTHESIA;  Surgeon: Luanne Bras, MD;  Location: [place];  Service: Radiology;  Laterality: N/A;   VIDEO BRONCHOSCOPY WITH RADIAL ENDOBRONCHIAL ULTRASOUND  05/30/2022   Procedure: VIDEO BRONCHOSCOPY WITH RADIAL ENDOBRONCHIAL ULTRASOUND;  Surgeon: Collene Gobble, MD;  Location: [facility] ENDOSCOPY;  Service: Pulmonary;;    Family History  Problem Relation Age of Onset   Throat cancer Mother    Liver cancer Father    Kidney failure Sister    Cancer - Lung Paternal Uncle     Social History   Tobacco Use   Smoking status: Former    Types: Cigarettes    Quit date: 05/02/2022    Years since quitting: 0.4    Passive exposure: Never   Smokeless tobacco: Never  Vaping Use   Vaping Use: Never used  Substance Use Topics   Alcohol use: Not Currently   Drug use: No    Prior to Admission medications   Medication Sig Start Date End  Date Taking? Authorizing Provider  albuterol (PROVENTIL) (2.5 MG/3ML) 0.083% nebulizer solution Take 3 mLs (2.5 mg total) by nebulization every 4 (four) hours as needed for wheezing or shortness of breath. 08/16/22   Lacinda Axon, MD  amoxicillin-clavulanate (AUGMENTIN) 875-125 MG tablet Take 1 tablet by mouth 2 (two) times daily for 21 days. 10/13/22 11/03/22  Barb Merino, MD  apixaban (ELIQUIS) 5 MG TABS tablet Take 2 tablets (10 mg total) by mouth 2 (two) times daily for 7 days.  10/13/22 10/20/22  Barb Merino, MD  apixaban (ELIQUIS) 5 MG TABS tablet Take 1 tablet (5 mg total) by mouth 2 (two) times daily. 10/21/22   Barb Merino, MD  APIXABAN Arne Cleveland) VTE STARTER PACK (10MG  AND 5MG ) Take as directed on package: start with two-5mg  tablets twice daily for 7 days. On day 8, switch to one-5mg  tablet twice daily. 10/13/22   Barb Merino, MD  atorvastatin (LIPITOR) 40 MG tablet Take 1 tablet (40 mg total) by mouth daily. 05/25/22 05/25/23  Lacinda Axon, MD  budesonide (PULMICORT) 0.5 MG/2ML nebulizer solution USE 2 ML(0.5 MG) VIA NEBULIZER TWICE DAILY Patient taking differently: Take 0.5 mg by nebulization 2 (two) times daily. 06/27/22   Freddi Starr, MD  clopidogrel (PLAVIX) 75 MG tablet Take 1 tablet (75 mg total) by mouth daily. Okay to restart this medication on 05/31/2022 Patient taking differently: Take 75 mg by mouth daily. 06/28/22   Gaylan Gerold, DO  folic acid (FOLVITE) 1 MG tablet Take 1 tablet (1 mg total) by mouth daily. 09/20/22   Gaylan Gerold, DO  levETIRAcetam (KEPPRA) 500 MG tablet Take 1 tablet (500 mg total) by mouth 2 (two) times daily. 06/02/22   Marcial Pacas, MD  midodrine (PROAMATINE) 2.5 MG tablet Take 1 tablet (2.5 mg total) by mouth 3 (three) times daily with meals. 08/30/22 02/26/23  Leigh Aurora, DO  Multiple Vitamin (MULTIVITAMIN WITH MINERALS) TABS tablet Take 1 tablet by mouth daily. 09/20/22   Gaylan Gerold, DO  nicotine (NICODERM CQ - DOSED IN MG/24 HOURS) 14 mg/24hr patch APPLY 1 PATCH(14 MG) TOPICALLY TO THE SKIN DAILY Patient taking differently: Place 14 mg onto the skin daily. 09/20/22   Gaylan Gerold, DO  ondansetron (ZOFRAN) 4 MG tablet Take 4 mg by mouth every 8 (eight) hours as needed for nausea or vomiting.    [provider]  STIOLTO RESPIMAT 2.5-2.5 MCG/ACT AERS INHALE 2 PUFFS INTO THE LUNGS DAILY 06/17/22   Freddi Starr, MD    Allergies Patient has no known allergies.   REVIEW OF SYSTEMS  Level 5  caveat   PHYSICAL EXAMINATION  Initial Vital Signs Blood pressure 102/75, pulse (!) 111, temperature 98.2 F (36.8 C), temperature source Oral, resp. rate 18, height 6\' 1"  (1.854 m), weight 72.6 kg, SpO2 96 %.  Examination General: Well-developed, well-nourished male in no acute distress; appearance consistent with age of record HENT: normocephalic; no hematomas seen or palpated Eyes: pupils equal, round and reactive to light; extraocular muscles grossly intact Neck: supple; nontender Heart: regular rate and rhythm Lungs: clear to auscultation bilaterally Abdomen: soft; nondistended; nontender; bowel sounds present Extremities: No deformity; mild pain on passive movement of right shoulder Neurologic: Awake, alert; right hemiparesis Skin: Warm and dry   RESULTS  Summary of this visit's results, reviewed and interpreted by myself:   EKG Interpretation  Date/Time:    Ventricular Rate:    PR Interval:    QRS Duration:   QT Interval:    QTC Calculation:  R Axis:     Text Interpretation:         Laboratory Studies: No results found for this or any previous visit (from the past 24 hour(s)). Imaging Studies: DG Shoulder Right  Result Date: 10/15/2022 CLINICAL DATA:  61 year old male status post unwitnessed fall. On Eliquis. Pain. EXAM: RIGHT SHOULDER - 2+ VIEW COMPARISON:  Right shoulder series 09/25/2022. FINDINGS: Two views. No glenohumeral joint dislocation. Proximal right humerus appears stable and intact. Bulky degenerative spurring at the right [facility] joint appears stable. Right clavicle and scapula appear stable and intact. Right chest or lung surgical clips again noted. Visible right ribs appear intact. IMPRESSION: No acute fracture or dislocation identified about the right shoulder. Electronically Signed   By: Genevie Ann M.D.   On: 10/15/2022 05:53   CT Head Wo Contrast  Result Date: 10/15/2022 CLINICAL DATA:  61 year old male status post unwitnessed fall. On Eliquis.  EXAM: CT HEAD WITHOUT CONTRAST TECHNIQUE: Contiguous axial images were obtained from the base of the skull through the vertex without intravenous contrast. RADIATION DOSE REDUCTION: This exam was performed according to the departmental dose-optimization program which includes automated exposure control, adjustment of the mA and/or kV according to patient size and/or use of iterative reconstruction technique. COMPARISON:  Head CT 09/25/2022.  Brain MRI 09/15/2022. FINDINGS: Brain: Relatively large patchy and confluent left hemisphere encephalomalacia with scattered areas of gyral calcification redemonstrated. Left MCA and left MCA/PCA watershed areas affected. Ex vacuo enlargement of the left lateral ventricle. Stable smaller chronic left cerebellar infarct. No midline shift, ventriculomegaly, mass effect, evidence of mass lesion, intracranial hemorrhage or evidence of cortically based acute infarction. Vascular: Calcified atherosclerosis at the skull base. Skull: Stable, intact. Sinuses/Orbits: Acute on chronic appearing right maxillary sinusitis with increased opacification there from earlier this month. Stable mild mucosal thickening elsewhere. Mild left mastoid effusion is stable. Other: Stable orbit and scalp soft tissues. IMPRESSION: 1. No acute intracranial abnormality or acute traumatic injury identified. 2. Stable chronic left cerebral and cerebellar encephalomalacia with gyral calcification. 3. Acute on chronic right maxillary sinusitis. Electronically Signed   By: Genevie Ann M.D.   On: 10/15/2022 05:52   ECHOCARDIOGRAM COMPLETE  Result Date: 10/13/2022    ECHOCARDIOGRAM REPORT   Patient Name:   LAKEITH CAREAGA Date of Exam: 10/13/2022 Medical Rec #:  [id number]     Height:       73.0 in Accession #:    [id number]    Weight:       160.0 lb Date of Birth:  [birth year]     BSA:          1.957 m Patient Age:    46 years      BP:           109/74 mmHg Patient Gender: M             HR:           80 bpm. Exam  Location:  Inpatient Procedure: 2D Echo Indications:    Pulmonary Embolism  History:        Patient has prior history of Echocardiogram examinations, most                 recent 08/27/2022. COPD and Stroke.  Sonographer:    Harvie Junior Referring Phys: [id number] Jonnie Finner  Sonographer Comments: Technically difficult study due to poor echo windows, no parasternal window and no apical window. Image acquisition challenging due to respiratory motion. IMPRESSIONS  1. Left ventricular ejection  fraction, by estimation, is 45 to 50%. The left ventricle has mildly decreased function. The left ventricle demonstrates global hypokinesis. Left ventricular diastolic parameters are indeterminate.  2. Right ventricular systolic function is normal. The right ventricular size is normal. There is normal pulmonary artery systolic pressure.  3. The mitral valve is normal in structure. No evidence of mitral valve regurgitation. No evidence of mitral stenosis.  4. The aortic valve is normal in structure. Aortic valve regurgitation is not visualized. No aortic stenosis is present.  5. The inferior vena cava is normal in size with greater than 50% respiratory variability, suggesting right atrial pressure of 3 mmHg. FINDINGS  Left Ventricle: Left ventricular ejection fraction, by estimation, is 45 to 50%. The left ventricle has mildly decreased function. The left ventricle demonstrates global hypokinesis. The left ventricular internal cavity size was normal in size. There is  no left ventricular hypertrophy. Left ventricular diastolic parameters are indeterminate. Right Ventricle: The right ventricular size is normal. No increase in right ventricular wall thickness. Right ventricular systolic function is normal. There is normal pulmonary artery systolic pressure. The tricuspid regurgitant velocity is 1.06 m/s, and  with an assumed right atrial pressure of 3 mmHg, the estimated right ventricular systolic pressure is 7.5 mmHg. Left Atrium:  Left atrial size was normal in size. Right Atrium: Right atrial size was normal in size. Pericardium: There is no evidence of pericardial effusion. Mitral Valve: The mitral valve is normal in structure. No evidence of mitral valve regurgitation. No evidence of mitral valve stenosis. Tricuspid Valve: The tricuspid valve is normal in structure. Tricuspid valve regurgitation is not demonstrated. No evidence of tricuspid stenosis. Aortic Valve: The aortic valve is normal in structure. Aortic valve regurgitation is not visualized. No aortic stenosis is present. Aortic valve mean gradient measures 2.0 mmHg. Aortic valve peak gradient measures 3.9 mmHg. Aortic valve area, by VTI measures 3.28 cm. Pulmonic Valve: The pulmonic valve was normal in structure. Pulmonic valve regurgitation is not visualized. No evidence of pulmonic stenosis. Aorta: The aortic root is normal in size and structure. Venous: The inferior vena cava is normal in size with greater than 50% respiratory variability, suggesting right atrial pressure of 3 mmHg. IAS/Shunts: No atrial level shunt detected by color flow Doppler.  LEFT VENTRICLE PLAX 2D LVIDd:         4.60 cm      Diastology LVIDs:         3.20 cm      LV e' lateral:   8.16 cm/s LV PW:         0.70 cm      LV E/e' lateral: 5.9 LV IVS:        0.70 cm LVOT diam:     2.30 cm LV SV:         56 LV SV Index:   29 LVOT Area:     4.15 cm  LV Volumes (MOD) LV vol d, MOD A2C: 70.4 ml LV vol d, MOD A4C: 104.0 ml LV vol s, MOD A2C: 32.7 ml LV vol s, MOD A4C: 47.2 ml LV SV MOD A2C:     37.7 ml LV SV MOD A4C:     104.0 ml LV SV MOD BP:      46.7 ml RIGHT VENTRICLE RV S prime:     21.60 cm/s TAPSE (M-mode): 2.1 cm AORTIC VALVE AV Area (Vmax):    3.31 cm AV Area (Vmean):   3.67 cm AV Area (VTI):     3.28  cm AV Vmax:           99.10 cm/s AV Vmean:          64.300 cm/s AV VTI:            0.171 m AV Peak Grad:      3.9 mmHg AV Mean Grad:      2.0 mmHg LVOT Vmax:         79.00 cm/s LVOT Vmean:        56.800  cm/s LVOT VTI:          0.135 m LVOT/AV VTI ratio: 0.79  AORTA Ao Root diam: 3.90 cm MITRAL VALVE               TRICUSPID VALVE MV Area (PHT): 4.80 cm    TR Peak grad:   4.5 mmHg MV Decel Time: 158 msec    TR Vmax:        106.00 cm/s MV E velocity: 48.40 cm/s MV A velocity: 75.80 cm/s  SHUNTS MV E/A ratio:  0.64        Systemic VTI:  0.14 m                            Systemic Diam: 2.30 cm Kardie Tobb DO Electronically signed by Berniece Salines DO Signature Date/Time: 10/13/2022/10:34:26 AM    Final     ED COURSE and MDM  Nursing notes, initial and subsequent vitals signs, including pulse oximetry, reviewed and interpreted by myself.  Vitals:   10/15/22 0502  BP: 102/75  Pulse: (!) 111  Resp: 18  Temp: 98.2 F (36.8 C)  TempSrc: Oral  SpO2: 96%  Weight: 72.6 kg  Height: 6\' 1"  (1.854 m)   Medications - No data to display  No evidence of significant intracranial or right shoulder injury.  Patient was only minimally tender on examination of the right shoulder.  PROCEDURES  Procedures   ED DIAGNOSES     ICD-10-CM   1. Fall from toilet seat, initial encounter  W18.11XA     2. Right shoulder injury, initial encounter  S49.91XA          Mathea Frieling, Jenny Reichmann, MD 10/15/22 [phone]

## 2022-10-17 ENCOUNTER — Other Ambulatory Visit (HOSPITAL_COMMUNITY): Payer: Self-pay

## 2022-10-19 ENCOUNTER — Other Ambulatory Visit: Payer: Self-pay | Admitting: *Deleted

## 2022-10-19 ENCOUNTER — Encounter (HOSPITAL_COMMUNITY): Payer: Self-pay | Admitting: Emergency Medicine

## 2022-10-19 ENCOUNTER — Other Ambulatory Visit: Payer: Self-pay

## 2022-10-19 ENCOUNTER — Emergency Department (HOSPITAL_COMMUNITY)
Admission: EM | Admit: 2022-10-19 | Discharge: 2022-10-19 | Disposition: A | Discharge status: Home / Self Care | Payer: Medicaid Other | Attending: Emergency Medicine | Admitting: Emergency Medicine

## 2022-10-19 ENCOUNTER — Emergency Department (HOSPITAL_COMMUNITY): Payer: Medicaid Other

## 2022-10-19 DIAGNOSIS — Z7901 Long term (current) use of anticoagulants: Secondary | ICD-10-CM | POA: Diagnosis not present

## 2022-10-19 DIAGNOSIS — M79601 Pain in right arm: Secondary | ICD-10-CM | POA: Diagnosis not present

## 2022-10-19 DIAGNOSIS — W010XXA Fall on same level from slipping, tripping and stumbling without subsequent striking against object, initial encounter: Secondary | ICD-10-CM | POA: Diagnosis not present

## 2022-10-19 DIAGNOSIS — W19XXXA Unspecified fall, initial encounter: Secondary | ICD-10-CM

## 2022-10-19 NOTE — ED Provider Triage Note (Signed)
Emergency Medicine Provider Triage Evaluation Note  Todd Estrada , a 61 y.o. male  was evaluated in triage.  Pt complains of right-sided forearm, wrist, and hand pain secondary to a fall.  Patient has known right-sided weakness secondary to a stroke.  He was trying to get up to turn his radio on when he failed to the right due to this weakness.  He hit his arm/hand/wrist on the radio that he was trying to turn on.  He denies hitting his head and denies loss of conscious.  He does take Eliquis due to the stroke  Review of Systems  Positive: As above Negative: As above  Physical Exam  BP 115/76 (BP Location: Left Arm)   Pulse (!) 102   Temp 97.8 F (36.6 C) (Oral)   Resp 16   SpO2 100%  Gen:   Awake, no distress   Resp:  Normal effort  MSK:   Moves extremities without difficulty  Other:    Medical Decision Making  Medically screening exam initiated at 12:34 PM.  Appropriate orders placed.  [name] was informed that the remainder of the evaluation will be completed by another provider, this initial triage assessment does not replace that evaluation, and the importance of remaining in the ED until their evaluation is complete.     Dorothyann Peng, PA-C 10/19/22 1234

## 2022-10-19 NOTE — Discharge Instructions (Signed)
There were no fractures or broken bones on the x-rays taken of your right wrist and arm.  Please continue to take all of your medications at home as per normal.

## 2022-10-19 NOTE — ED Provider Notes (Signed)
[facility] DEPT Provider Note   CSN: [id number] Arrival date & time: 10/19/22  1157     History  Chief Complaint  Patient presents with   Fall   Arm Pain    Todd Estrada is a 61 y.o. male presented emergency department pain in his right wrist after a fall today.  The patient has right arm weakness from a stroke, says he was fumbling with the radio and feel striking his wrist on the radial.  He denies head injury.  He is on Eliquis.  He was discharged from the hospital earlier this week after admission for necrotizing pneumonia, for which she is on Augmentin, and he tells me he continues to take all of his medication including his Eliquis and Augmentin  HPI     Home Medications Prior to Admission medications   Medication Sig Start Date End Date Taking? Authorizing Provider  albuterol (PROVENTIL) (2.5 MG/3ML) 0.083% nebulizer solution Take 3 mLs (2.5 mg total) by nebulization every 4 (four) hours as needed for wheezing or shortness of breath. 08/16/22   Lacinda Axon, MD  amoxicillin-clavulanate (AUGMENTIN) 875-125 MG tablet Take 1 tablet by mouth 2 (two) times daily for 21 days. 10/13/22 11/03/22  Barb Merino, MD  apixaban (ELIQUIS) 5 MG TABS tablet Take 2 tablets (10 mg total) by mouth 2 (two) times daily for 7 days. 10/13/22 10/20/22  Barb Merino, MD  apixaban (ELIQUIS) 5 MG TABS tablet Take 1 tablet (5 mg total) by mouth 2 (two) times daily. 10/21/22   Barb Merino, MD  APIXABAN Arne [place]) VTE STARTER PACK (10MG  AND 5MG ) Take as directed on package: start with two-5mg  tablets twice daily for 7 days. On day 8, switch to one-5mg  tablet twice daily. 10/13/22   Barb Merino, MD  atorvastatin (LIPITOR) 40 MG tablet Take 1 tablet (40 mg total) by mouth daily. 05/25/22 05/25/23  Lacinda Axon, MD  budesonide (PULMICORT) 0.5 MG/2ML nebulizer solution USE 2 ML(0.5 MG) VIA NEBULIZER TWICE DAILY Patient taking differently: Take 0.5 mg by  nebulization 2 (two) times daily. 06/27/22   Freddi Starr, MD  clopidogrel (PLAVIX) 75 MG tablet Take 1 tablet (75 mg total) by mouth daily. Okay to restart this medication on 05/31/2022 Patient taking differently: Take 75 mg by mouth daily. 06/28/22   Gaylan Gerold, DO  folic acid (FOLVITE) 1 MG tablet Take 1 tablet (1 mg total) by mouth daily. 09/20/22   Gaylan Gerold, DO  levETIRAcetam (KEPPRA) 500 MG tablet Take 1 tablet (500 mg total) by mouth 2 (two) times daily. 06/02/22   Marcial Pacas, MD  midodrine (PROAMATINE) 2.5 MG tablet Take 1 tablet (2.5 mg total) by mouth 3 (three) times daily with meals. 08/30/22 02/26/23  Leigh Aurora, DO  Multiple Vitamin (MULTIVITAMIN WITH MINERALS) TABS tablet Take 1 tablet by mouth daily. 09/20/22   Gaylan Gerold, DO  nicotine (NICODERM CQ - DOSED IN MG/24 HOURS) 14 mg/24hr patch APPLY 1 PATCH(14 MG) TOPICALLY TO THE SKIN DAILY Patient taking differently: Place 14 mg onto the skin daily. 09/20/22   Gaylan Gerold, DO  ondansetron (ZOFRAN) 4 MG tablet Take 4 mg by mouth every 8 (eight) hours as needed for nausea or vomiting.    [provider]  STIOLTO RESPIMAT 2.5-2.5 MCG/ACT AERS INHALE 2 PUFFS INTO THE LUNGS DAILY 06/17/22   Freddi Starr, MD      Allergies    Patient has no known allergies.    Review of Systems   Review of  Systems  Physical Exam Updated Vital Signs BP 115/76 (BP Location: Left Arm)   Pulse (!) 102   Temp 97.8 F (36.6 C) (Oral)   Resp 16   SpO2 100%  Physical Exam Constitutional:      General: He is not in acute distress. HENT:     Head: Normocephalic and atraumatic.  Eyes:     Conjunctiva/sclera: Conjunctivae normal.     Pupils: Pupils are equal, round, and reactive to light.  Cardiovascular:     Rate and Rhythm: Normal rate and regular rhythm.  Pulmonary:     Effort: Pulmonary effort is normal. No respiratory distress.  Musculoskeletal:     Comments: Patient has home wrist brace on.  No visible deformity of the  arm.  No ecchymoses.  Some tenderness with range of motion  Skin:    General: Skin is warm and dry.  Neurological:     Mental Status: He is alert. Mental status is at baseline.  Psychiatric:        Mood and Affect: Mood normal.        Behavior: Behavior normal.     ED Results / Procedures / Treatments   Labs (all labs ordered are listed, but only abnormal results are displayed) Labs Reviewed - No data to display  EKG None  Radiology DG Forearm Right  Result Date: 10/19/2022 CLINICAL DATA:  Fall. EXAM: RIGHT FOREARM - 2 VIEW COMPARISON:  Right wrist 10/19/2022 FINDINGS: There is no evidence of fracture or other focal bone lesions. Soft tissues are unremarkable. IMPRESSION: Negative. Electronically Signed   By: Franchot Gallo M.D.   On: 10/19/2022 13:16   DG Hand Complete Right  Result Date: 10/19/2022 CLINICAL DATA:  Fall. EXAM: RIGHT WRIST - COMPLETE 3+ VIEW; RIGHT HAND - COMPLETE 3+ VIEW COMPARISON:  Right wrist and hand x-rays dated September 25, 2022. FINDINGS: There is no evidence of acute fracture or dislocation. Old second metacarpal neck fracture again noted. Osteopenia. Soft tissues are unremarkable. IMPRESSION: 1. No acute osseous abnormality. Electronically Signed   By: Titus Dubin M.D.   On: 10/19/2022 13:15   DG Wrist Complete Right  Result Date: 10/19/2022 CLINICAL DATA:  Fall. EXAM: RIGHT WRIST - COMPLETE 3+ VIEW; RIGHT HAND - COMPLETE 3+ VIEW COMPARISON:  Right wrist and hand x-rays dated September 25, 2022. FINDINGS: There is no evidence of acute fracture or dislocation. Old second metacarpal neck fracture again noted. Osteopenia. Soft tissues are unremarkable. IMPRESSION: 1. No acute osseous abnormality. Electronically Signed   By: Titus Dubin M.D.   On: 10/19/2022 13:15    Procedures Procedures    Medications Ordered in ED Medications - No data to display  ED Course/ Medical Decision Making/ A&P                           Medical Decision Making  Patient  is here with a low impact fall and injury to the right wrist.  I personally reviewed interpreted the patient's x-rays, ordered from triage, there were no acute fractures.  I explained this to the patient, who has difficulty speaking but appears to understand and answer appropriately yes or no to questions I ask him.  He denies several times that he struck his head.  I do not see an indication for CT imaging of the head at this time.  There are no other acute medical complaints.  I reviewed his external records including hospital discharge summary from earlier this  week.  We will try to contact family members to pick him up.  The patient verbalized understanding        Final Clinical Impression(s) / ED Diagnoses Final diagnoses:  Fall, initial encounter  Right arm pain    Rx / DC Orders ED Discharge Orders     None         Zenora Karpel, Carola Rhine, MD 10/19/22 1514

## 2022-10-19 NOTE — ED Triage Notes (Signed)
Pt reports fall today into a radio & then slid down into floor. Pt family reports hx of stroke & that pt is on eliquis. Pt reports pain in right arm & wrist

## 2022-10-19 NOTE — Telephone Encounter (Signed)
Patient's cousin called in for refill on midodrine at [facility] on [address].

## 2022-10-19 NOTE — ED Notes (Signed)
Called son [name] and left message about picking the pt up

## 2022-10-19 NOTE — ED Notes (Signed)
Call to pt son regarding picking up pt. Message left  Scheu,James (Relative)  [phone] [facility])

## 2022-10-20 MED ORDER — MIDODRINE HCL 2.5 MG PO TABS: 2.5000 mg | ORAL_TABLET | Freq: Three times a day (TID) | ORAL | 5 refills | Status: DC

## 2022-10-23 ENCOUNTER — Other Ambulatory Visit: Payer: Self-pay

## 2022-10-23 ENCOUNTER — Encounter (HOSPITAL_COMMUNITY): Payer: Self-pay

## 2022-10-23 ENCOUNTER — Emergency Department (HOSPITAL_COMMUNITY): Payer: Medicaid Other

## 2022-10-23 ENCOUNTER — Emergency Department (HOSPITAL_COMMUNITY)
Admission: EM | Admit: 2022-10-23 | Discharge: 2022-10-23 | Disposition: A | Discharge status: Home / Self Care | Payer: Medicaid Other | Attending: Emergency Medicine | Admitting: Emergency Medicine

## 2022-10-23 DIAGNOSIS — J45909 Unspecified asthma, uncomplicated: Secondary | ICD-10-CM | POA: Diagnosis not present

## 2022-10-23 DIAGNOSIS — M25551 Pain in right hip: Secondary | ICD-10-CM | POA: Diagnosis present

## 2022-10-23 DIAGNOSIS — Z7902 Long term (current) use of antithrombotics/antiplatelets: Secondary | ICD-10-CM | POA: Insufficient documentation

## 2022-10-23 DIAGNOSIS — W19XXXA Unspecified fall, initial encounter: Secondary | ICD-10-CM | POA: Diagnosis not present

## 2022-10-23 DIAGNOSIS — J449 Chronic obstructive pulmonary disease, unspecified: Secondary | ICD-10-CM | POA: Insufficient documentation

## 2022-10-23 DIAGNOSIS — Z7951 Long term (current) use of inhaled steroids: Secondary | ICD-10-CM | POA: Insufficient documentation

## 2022-10-23 DIAGNOSIS — Y92009 Unspecified place in unspecified non-institutional (private) residence as the place of occurrence of the external cause: Secondary | ICD-10-CM | POA: Diagnosis not present

## 2022-10-23 DIAGNOSIS — I5042 Chronic combined systolic (congestive) and diastolic (congestive) heart failure: Secondary | ICD-10-CM | POA: Diagnosis not present

## 2022-10-23 DIAGNOSIS — Z85118 Personal history of other malignant neoplasm of bronchus and lung: Secondary | ICD-10-CM | POA: Diagnosis not present

## 2022-10-23 DIAGNOSIS — Z7901 Long term (current) use of anticoagulants: Secondary | ICD-10-CM | POA: Diagnosis not present

## 2022-10-23 DIAGNOSIS — Z87891 Personal history of nicotine dependence: Secondary | ICD-10-CM | POA: Insufficient documentation

## 2022-10-23 DIAGNOSIS — I110 Hypertensive heart disease with heart failure: Secondary | ICD-10-CM | POA: Diagnosis not present

## 2022-10-23 MED ORDER — ACETAMINOPHEN 500 MG PO TABS
1000.0000 mg | ORAL_TABLET | Freq: Once | ORAL | Status: AC
  Administered 2022-10-23: 1000 mg via ORAL
  Filled 2022-10-23: qty 2

## 2022-10-23 NOTE — ED Notes (Signed)
Livia Snellen (Relative) called asking to be called when PT is discharged. [phone]

## 2022-10-23 NOTE — ED Triage Notes (Signed)
Pt arrived POV from home c/o a fall that happened a few days ago. Pt was seen at [facility] long and told he was fine but pt is still having right hand pain and right hip pain. Pt has a hx of a stroke and takes Eliquis. Pt denies hitting his head.

## 2022-10-23 NOTE — ED Notes (Signed)
Pt son said he wont be able to wait long and to call him when he gets to a room

## 2022-10-23 NOTE — ED Provider Triage Note (Signed)
Emergency Medicine Provider Triage Evaluation Note  Todd Estrada , a 61 y.o. male  was evaluated in triage.  Pt complains of right hip pain and right hand pain ongoing since a fall on 11/1.  Patient brought in by his cousin/caregiver today.  They have not had any new falls since 11/1, they report they are seen and was not on emergency department at the time and x-rays of the arm were obtained and were negative.  Patient's been using a wrist brace since that time with minimal relief.  He reports ongoing right hand pain.  He also reports developing right hip pain the day after his fall.  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  Review of Systems  Positive: Right hand/right hip pain Negative: Head injury/loss conscious, headache, neck pain, back pain, chest pain, abdominal pain, new fall/injury or any additional concerns.  Physical Exam  BP 98/68 (BP Location: Left Arm)   Pulse 93   Temp (!) 97.5 F (36.4 C) (Oral)   Resp 17   Ht 6' (1.829 m)   Wt 72.6 kg   SpO2 100%   BMI 21.70 kg/m  Gen:   Awake, no distress   Resp:  Normal effort  MSK:   No midline spinal tenderness palpation.  No crepitus step-off deformity spine.  No paraspinal muscular tenderness palpation.  No SI joint tenderness palpation.  Pelvis stable compression.  Medical Decision Making  Medically screening exam initiated at 2:22 PM.  Appropriate orders placed.  [name] was informed that the remainder of the evaluation will be completed by another provider, this initial triage assessment does not replace that evaluation, and the importance of remaining in the ED until their evaluation is complete.    Note: Portions of this report may have been transcribed using voice recognition software. Every effort was made to ensure accuracy; however, inadvertent computerized transcription errors may still be present.    Deliah Boston,  PA-C 10/23/22 1425

## 2022-10-24 NOTE — ED Provider Notes (Signed)
[facility] EMERGENCY DEPARTMENT Provider Note  CSN: [id number] Arrival date & time: 10/23/22 1237  Chief Complaint(s) Fall  HPI Todd Estrada is a 61 y.o. 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  He fell November 1, went to outside hospital, had x-rays done of the right hand and wrist which were negative.  He reports that he has had right hip pain as well.  No fevers, chills.  No other falls.  No nausea, vomiting   Past Medical History Past Medical History:  Diagnosis Date   Asthma    COPD (chronic obstructive pulmonary disease) ([place])    Essential hypertension 08/19/2021   GERD (gastroesophageal reflux disease)    History of tracheostomy    03/09/22-04/11/22   HLD (hyperlipidemia)    Hypertension    Lung cancer (HCC)    PAD (peripheral artery disease) ([place])    Seizures ([place]) 06/02/2022   Stroke ([place]) 02/2022   Patient Active Problem List   Diagnosis Date Noted   Pulmonary embolism ([place]) 10/13/2022   Acute pulmonary embolism ([place]) 10/12/2022   HAP (hospital-acquired pneumonia) 09/16/2022   Community acquired pneumonia 09/14/2022   Cervical transverse process fracture ([place]) 09/10/2022   Unwitnessed fall 08/27/2022   Aphasia as late effect of cerebrovascular accident 08/16/2022   Sepsis ([place]) 08/13/2022   Atypical chest pain 08/13/2022   Anemia of chronic disease 08/13/2022   Hypokalemia 08/13/2022   Hypomagnesemia 08/13/2022   Chronic combined systolic and diastolic heart failure ([place]) 08/13/2022   History of seizures 08/13/2022   Multifocal pneumonia 08/12/2022   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[place]) 06/24/2022   Encounter for antineoplastic chemotherapy 06/24/2022   Healthcare maintenance 06/22/2022   Seizures ([place]) 06/02/2022   Squamous cell lung cancer ([place]) 05/10/2022   Emphysema lung ([place])  04/14/2022   Dyslipidemia    Dysphagia, post-stroke    Internal carotid artery occlusion, left 03/03/2022   Malnutrition of moderate degree 03/03/2022   Hx of ischemic left MCA stroke 03/02/2022   Tobacco use disorder 08/19/2021   CVA (cerebral vascular accident) ([place]) 08/18/2021   Home Medication(s) Prior to Admission medications   Medication Sig Start Date End Date Taking? Authorizing Provider  albuterol (PROVENTIL) (2.5 MG/3ML) 0.083% nebulizer solution Take 3 mLs (2.5 mg total) by nebulization every 4 (four) hours as needed for wheezing or shortness of breath. 08/16/22   Lacinda Axon, MD  amoxicillin-clavulanate (AUGMENTIN) 875-125 MG tablet Take 1 tablet by mouth 2 (two) times daily for 21 days. 10/13/22 11/03/22  Barb Merino, MD  apixaban (ELIQUIS) 5 MG TABS tablet Take 2 tablets (10 mg total) by mouth 2 (two) times daily for 7 days. 10/13/22 10/20/22  Barb Merino, MD  apixaban (ELIQUIS) 5 MG TABS tablet Take 1 tablet (5 mg total) by mouth 2 (two) times daily. 10/21/22   Barb Merino, MD  APIXABAN Arne [place]) VTE STARTER PACK (10MG  AND 5MG ) Take as directed on package: start with two-5mg  tablets twice daily for 7 days. On day 8, switch to one-5mg  tablet twice daily. 10/13/22   Barb Merino, MD  atorvastatin (LIPITOR) 40 MG tablet Take 1 tablet (40 mg total) by mouth daily. 05/25/22 05/25/23  Lacinda Axon, MD  budesonide (PULMICORT) 0.5 MG/2ML nebulizer solution USE 2 ML(0.5 MG) VIA NEBULIZER TWICE DAILY Patient taking differently: Take 0.5 mg by nebulization 2 (two) times daily. 06/27/22   Freda Jackson  B, MD  clopidogrel (PLAVIX) 75 MG tablet Take 1 tablet (75 mg total) by mouth daily. Okay to restart this medication on 05/31/2022 Patient taking differently: Take 75 mg by mouth daily. 06/28/22   Gaylan Gerold, DO  folic acid (FOLVITE) 1 MG tablet Take 1 tablet (1 mg total) by mouth daily. 09/20/22   Gaylan Gerold, DO  levETIRAcetam (KEPPRA) 500 MG tablet Take 1 tablet (500  mg total) by mouth 2 (two) times daily. 06/02/22   Marcial Pacas, MD  midodrine (PROAMATINE) 2.5 MG tablet Take 1 tablet (2.5 mg total) by mouth 3 (three) times daily with meals. 10/20/22 04/18/23  [name], MD  Multiple Vitamin (MULTIVITAMIN WITH MINERALS) TABS tablet Take 1 tablet by mouth daily. 09/20/22   Gaylan Gerold, DO  nicotine (NICODERM CQ - DOSED IN MG/24 HOURS) 14 mg/24hr patch APPLY 1 PATCH(14 MG) TOPICALLY TO THE SKIN DAILY Patient taking differently: Place 14 mg onto the skin daily. 09/20/22   Gaylan Gerold, DO  ondansetron (ZOFRAN) 4 MG tablet Take 4 mg by mouth every 8 (eight) hours as needed for nausea or vomiting.    [provider]  STIOLTO RESPIMAT 2.5-2.5 MCG/ACT AERS INHALE 2 PUFFS INTO THE LUNGS DAILY 06/17/22   Freddi Starr, MD                                                                                                                                    Past Surgical History Past Surgical History:  Procedure Laterality Date   BRONCHIAL BIOPSY  05/30/2022   Procedure: BRONCHIAL BIOPSIES;  Surgeon: Collene Gobble, MD;  Location: [facility] ENDOSCOPY;  Service: Pulmonary;;   BRONCHIAL BRUSHINGS  05/30/2022   Procedure: BRONCHIAL BRUSHINGS;  Surgeon: Collene Gobble, MD;  Location: [facility] ENDOSCOPY;  Service: Pulmonary;;   BRONCHIAL NEEDLE ASPIRATION BIOPSY  05/30/2022   Procedure: BRONCHIAL NEEDLE ASPIRATION BIOPSIES;  Surgeon: Collene Gobble, MD;  Location: [place];  Service: Pulmonary;;   BRONCHIAL WASHINGS  05/30/2022   Procedure: BRONCHIAL WASHINGS;  Surgeon: Collene Gobble, MD;  Location: [facility] ENDOSCOPY;  Service: Pulmonary;;   ESOPHAGOGASTRODUODENOSCOPY (EGD) WITH PROPOFOL N/A 03/11/2022   Procedure: ESOPHAGOGASTRODUODENOSCOPY (EGD) WITH PROPOFOL;  Surgeon: Georganna Skeans, MD;  Location: [place];  Service: General;  Laterality: N/A;   FIDUCIAL MARKER PLACEMENT  05/30/2022   Procedure: FIDUCIAL MARKER PLACEMENT;  Surgeon: Collene Gobble, MD;  Location: [facility]  ENDOSCOPY;  Service: Pulmonary;;   IR ANGIO INTRA EXTRACRAN SEL COM CAROTID INNOMINATE UNI R MOD SED  03/02/2022   IR CT HEAD LTD  03/02/2022   IR PERCUTANEOUS ART THROMBECTOMY/INFUSION INTRACRANIAL INC DIAG ANGIO  03/02/2022   PEG PLACEMENT N/A 03/11/2022   Procedure: PERCUTANEOUS ENDOSCOPIC GASTROSTOMY (PEG) PLACEMENT;  Surgeon: Georganna Skeans, MD;  Location: [place];  Service: General;  Laterality: N/A;   RADIOLOGY WITH ANESTHESIA N/A 03/02/2022   Procedure: IR WITH ANESTHESIA;  Surgeon: Luanne Bras, MD;  Location: [facility]  OR;  Service: Radiology;  Laterality: N/A;   VIDEO BRONCHOSCOPY WITH RADIAL ENDOBRONCHIAL ULTRASOUND  05/30/2022   Procedure: VIDEO BRONCHOSCOPY WITH RADIAL ENDOBRONCHIAL ULTRASOUND;  Surgeon: Collene Gobble, MD;  Location: [facility] ENDOSCOPY;  Service: Pulmonary;;   Family History Family History  Problem Relation Age of Onset   Throat cancer Mother    Liver cancer Father    Kidney failure Sister    Cancer - Lung Paternal Uncle     Social History Social History   Tobacco Use   Smoking status: Former    Types: Cigarettes    Quit date: 05/02/2022    Years since quitting: 0.4    Passive exposure: Never   Smokeless tobacco: Never  Vaping Use   Vaping Use: Never used  Substance Use Topics   Alcohol use: Not Currently   Drug use: No   Allergies Patient has no known allergies.  Review of Systems Review of Systems  Physical Exam Vital Signs  I have reviewed the triage vital signs BP 111/85   Pulse 98   Temp 98.5 F (36.9 C)   Resp 14   Ht 6' (1.829 m)   Wt 72.6 kg   SpO2 100%   BMI 21.70 kg/m  Physical Exam Vitals and nursing note reviewed.  Constitutional:      General: He is not in acute distress.    Appearance: Normal appearance.  HENT:     Head: Normocephalic and atraumatic.     Mouth/Throat:     Mouth: Mucous membranes are moist.  Eyes:     Conjunctiva/sclera: Conjunctivae normal.  Cardiovascular:     Rate and Rhythm: Normal rate.   Pulmonary:     Effort: Pulmonary effort is normal. No respiratory distress.  Abdominal:     General: Abdomen is flat.  Musculoskeletal:     Comments: No focal tenderness to the right wrist or hand, no scaphoid tenderness.  Full range of motion of the right wrist and fingers of the right hand with active and passive range of motion.  Full range of motion of the right hip without focal tenderness.  No decreased range of motion or tenderness of the remainder of the right lower extremity.  Skin:    General: Skin is warm and dry.     Capillary Refill: Capillary refill takes less than 2 seconds.  Neurological:     Mental Status: He is alert. Mental status is at baseline.     Comments: Chronic aphasia and right-sided weakness in the right arm and right leg  Psychiatric:        Mood and Affect: Mood normal.        Behavior: Behavior normal.     ED Results and Treatments Labs (all labs ordered are listed, but only abnormal results are displayed) Labs Reviewed - No data to display  Radiology DG Hip Unilat W or Wo Pelvis 2-3 Views Right  Result Date: 10/23/2022 CLINICAL DATA:  Fall a few days ago. Persistent right hip pain. EXAM: DG HIP (WITH OR WITHOUT PELVIS) 2-3V RIGHT COMPARISON:  None Available. FINDINGS: No acute or healing fracture of the pelvis or right hip. The femoral head is seated. Mild hip degenerative change with acetabular spurring. No erosion or avascular necrosis. Intact pubic rami. Vascular calcifications are seen. IMPRESSION: Mild degenerative change of the right hip. No fracture. Electronically Signed   By: Keith Rake M.D.   On: 10/23/2022 16:11    Pertinent labs & imaging results that were available during my care of the patient were reviewed by me and considered in my medical decision making (see MDM for details).  Medications Ordered in  ED Medications  acetaminophen (TYLENOL) tablet 1,000 mg (1,000 mg Oral Given 10/23/22 2048)                                                                                                                                     Procedures Procedures  (including critical care time)  Medical Decision Making / ED Course   MDM:  61 year old male presenting to the emergency department with right hand and right leg weakness.  Patient well-appearing, at neurologic baseline.  No obvious signs of traumatic injury on physical exam.  Range of motion is intact and there is no focal tenderness, x-ray of the right hip is negative.  Patient already had previous x-rays of the right wrist and hand without fracture.  Likely contusion after fall.  Doubt occult fracture given intact range of motion no tenderness. Will discharge patient to home. All questions answered. Patient comfortable with plan of discharge. Return precautions discussed with patient and specified on the after visit summary.       Additional history obtained: -Additional history obtained from family -External records from outside source obtained and reviewed including: Chart review including previous notes, labs, imaging, consultation notes including ed visit 11/1  Imaging Studies ordered: I ordered imaging studies including XR right hip On my interpretation imaging demonstrates no fracture I independently visualized and interpreted imaging. I agree with the radiologist interpretation   Medicines ordered and prescription drug management: Meds ordered this encounter  Medications   acetaminophen (TYLENOL) tablet 1,000 mg    -I have reviewed the patients home medicines and have made adjustments as needed   Reevaluation: After the interventions noted above, I reevaluated the patient and found that they have improved  Co morbidities that complicate the patient evaluation  Past Medical History:  Diagnosis Date   Asthma    COPD  (chronic obstructive pulmonary disease) ([place])    Essential hypertension 08/19/2021   GERD (gastroesophageal reflux disease)    History of tracheostomy    03/09/22-04/11/22   HLD (hyperlipidemia)    Hypertension    Lung cancer ([place])    PAD (peripheral artery disease) ([place])  Seizures ([place]) 06/02/2022   Stroke ([place]) 02/2022      Dispostion: Disposition decision including need for hospitalization was considered, and patient discharged from emergency department.    Final Clinical Impression(s) / ED Diagnoses Final diagnoses:  Fall in home, initial encounter  Right hip pain     This chart was dictated using voice recognition software.  Despite best efforts to proofread,  errors can occur which can change the documentation meaning.    Cristie Hem, MD 10/24/22 1046

## 2022-10-25 ENCOUNTER — Telehealth: Payer: Self-pay | Admitting: *Deleted

## 2022-10-25 NOTE — Telephone Encounter (Signed)
Received call from [place], RN Case Manager with [facility]. States she spoke with patient's brother, [name], yesterday and he would like to to use their compliance pill packaging. Patient has refills on all meds at [facility] except nicotine patches. She is requesting refill on patches be sent to [facility] as it takes 2 weeks to get him set up with [facility]. Attempted to confirm this with patient's brother. Left detailed message on [name]' self-identified VM requesting return call to confirm.

## 2022-10-28 ENCOUNTER — Other Ambulatory Visit: Payer: Self-pay | Admitting: Student

## 2022-11-02 ENCOUNTER — Ambulatory Visit (INDEPENDENT_AMBULATORY_CARE_PROVIDER_SITE_OTHER): Payer: Medicaid Other | Admitting: Primary Care

## 2022-11-02 ENCOUNTER — Ambulatory Visit (INDEPENDENT_AMBULATORY_CARE_PROVIDER_SITE_OTHER): Payer: Medicaid Other

## 2022-11-02 ENCOUNTER — Encounter: Payer: Self-pay | Admitting: Primary Care

## 2022-11-02 VITALS — BP 110/70 | HR 85 | Ht 72.0 in | Wt 160.1 lb

## 2022-11-02 DIAGNOSIS — J189 Pneumonia, unspecified organism: Secondary | ICD-10-CM

## 2022-11-02 DIAGNOSIS — J432 Centrilobular emphysema: Secondary | ICD-10-CM

## 2022-11-02 DIAGNOSIS — J850 Gangrene and necrosis of lung: Secondary | ICD-10-CM | POA: Diagnosis not present

## 2022-11-02 DIAGNOSIS — C3490 Malignant neoplasm of unspecified part of unspecified bronchus or lung: Secondary | ICD-10-CM

## 2022-11-02 DIAGNOSIS — I2699 Other pulmonary embolism without acute cor pulmonale: Secondary | ICD-10-CM

## 2022-11-02 NOTE — Progress Notes (Signed)
Please let patient/or his cousin know that CXR showed persistent opacities in the left lung unchanged since abx. Unchanged suspicious nodule right upper lung. He has known lung cancer. Please order CT chest wo contrast in 2 weeks to better evaluate. He finishes abx in 2 days.

## 2022-11-02 NOTE — Progress Notes (Signed)
@Patient  ID: Todd Estrada, male    DOB: March 23, 1961, 61 y.o.   MRN: [id number]  No chief complaint on file.   Referring provider: [name], MD  HPI: 61 year old male, former smoker.  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 Hoover Browns is a 61 year old male, recent former smoker with history of hypertension and CVA who is referred to pulmonary clinic for emphysema.   He has been using stiolto and budesonide since discharge and reports his breathing has been fine. 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   He had tracheostomy placed on 3/21 during his admission for difficulty weaning off the ventilator along with multiple intubations.  He was decannulated 4/24 while in rehab.  He has quit smoking since his admission.  He was smoking 1 to 1.5 packs for 50 years.  CT chest scan 03/18/2022 showed moderate to severe emphysema.  There is 2.7 x 3.7 cm lobulated spiculated mass within the left upper lobe.  There is a 16 x 20 mm spiculated nodule within the posterior segment of the right upper lobe.  He denies any hemoptysis or weight loss.  His father had pancreatic cancer.  His uncle had stage IV lung cancer.  He has a an aunt with breast cancer.    11/02/2022- INTERIM  Patient presents today for hospital follow-up. 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 Patient of Dr. Erin Fulling.   He has been seen at the emergency room several times d/t falls over the last couple of months. Most recent was on 10/23/22. He was admitted from 10/12/22-10/14/22 for acute pulmonary embolism. He originally presented with chest pain occurring in the middle of the night. He does  have history of necrotizing pneumonia. CTA showed acute PE on the segmental artery and bilateral apical mass, left apical mass consistent with necrotizing tumor versus pneumonia.   Accompanied by his cousin who is his caretaker. Patient is doing alright today without acute respiratory complaints. He has 2 days left of Augmentin. He has a congested cough without mucus production. He is no longer having symptoms of chest pain or shortness of breath. He is compliant with Stiolto Respimat and budesonide nebulizer twice a day. He has not required Albuterol. He is taking Eliquis as directed, no bleeding.   No Known Allergies   There is no immunization history on file for this patient.  Past Medical History:  Diagnosis Date   Asthma    COPD (chronic obstructive pulmonary disease) ([place])    Essential hypertension 08/19/2021   GERD (gastroesophageal reflux disease)    History of tracheostomy    03/09/22-04/11/22   HLD (hyperlipidemia)    Hypertension    Lung cancer ([facility])    PAD (peripheral artery disease) ([facility])    Seizures ([place]) 06/02/2022   Stroke ([place]) 02/2022    Tobacco History: Social History   Tobacco Use  Smoking Status Former   Types: Cigarettes   Quit date: 05/02/2022   Years since quitting: 0.5   Passive exposure: Never  Smokeless Tobacco Never   Counseling given: Not Answered   Outpatient Medications Prior to Visit  Medication Sig Dispense Refill   albuterol (PROVENTIL) (2.5 MG/3ML) 0.083%  nebulizer solution Take 3 mLs (2.5 mg total) by nebulization every 4 (four) hours as needed for wheezing or shortness of breath. 90 mL 12   amoxicillin-clavulanate (AUGMENTIN) 875-125 MG tablet Take 1 tablet by mouth 2 (two) times daily for 21 days. 42 tablet 0   budesonide (PULMICORT) 0.5 MG/2ML nebulizer solution USE 2 ML(0.5 MG) VIA NEBULIZER TWICE DAILY (Patient taking differently: Take 0.5 mg by nebulization 2 (two) times daily.) 120 mL 11   STIOLTO RESPIMAT 2.5-2.5 MCG/ACT AERS INHALE 2  PUFFS INTO THE LUNGS DAILY 4 g 5   apixaban (ELIQUIS) 5 MG TABS tablet Take 2 tablets (10 mg total) by mouth 2 (two) times daily for 7 days. 28 tablet 0   apixaban (ELIQUIS) 5 MG TABS tablet Take 1 tablet (5 mg total) by mouth 2 (two) times daily. 60 tablet 2   APIXABAN (ELIQUIS) VTE STARTER PACK (10MG  AND 5MG ) Take as directed on package: start with two-5mg  tablets twice daily for 7 days. On day 8, switch to one-5mg  tablet twice daily. 74 each 0   atorvastatin (LIPITOR) 40 MG tablet Take 1 tablet (40 mg total) by mouth daily. 90 tablet 3   clopidogrel (PLAVIX) 75 MG tablet Take 1 tablet (75 mg total) by mouth daily. Okay to restart this medication on 05/31/2022 (Patient taking differently: Take 75 mg by mouth daily.) 90 tablet 3   folic acid (FOLVITE) 1 MG tablet Take 1 tablet (1 mg total) by mouth daily. 30 tablet 2   levETIRAcetam (KEPPRA) 500 MG tablet Take 1 tablet (500 mg total) by mouth 2 (two) times daily. 180 tablet 3   midodrine (PROAMATINE) 2.5 MG tablet Take 1 tablet (2.5 mg total) by mouth 3 (three) times daily with meals. 90 tablet 5   Multiple Vitamin (MULTIVITAMIN WITH MINERALS) TABS tablet Take 1 tablet by mouth daily. 30 tablet 2   nicotine (NICODERM CQ - DOSED IN MG/24 HOURS) 14 mg/24hr patch APPLY 1 PATCH(14 MG) TOPICALLY TO THE SKIN DAILY 28 patch 0   ondansetron (ZOFRAN) 4 MG tablet Take 4 mg by mouth every 8 (eight) hours as needed for nausea or vomiting.     No facility-administered medications prior to visit.   Review of Systems  Review of Systems  Constitutional: Negative.   HENT:  Positive for congestion.   Respiratory:  Positive for cough. Negative for shortness of breath and wheezing.   Cardiovascular: Negative.      Physical Exam  BP 110/70 (BP Location: Left Arm)   Pulse 85   Ht 6' (1.829 m)   Wt 160 lb 0.9 oz (72.6 kg)   SpO2 91%   BMI 21.71 kg/m  Physical Exam Constitutional:      Appearance: Normal appearance.  HENT:     Head: Normocephalic and  atraumatic.  Cardiovascular:     Rate and Rhythm: Normal rate and regular rhythm.  Pulmonary:     Effort: Pulmonary effort is normal.     Breath sounds: Normal breath sounds.     Comments: Lung fields clear, congested upper airway cough Musculoskeletal:     Comments: In WC  Neurological:     General: No focal deficit present.     Mental Status: He is alert and oriented to person, place, and time. Mental status is at baseline.  Psychiatric:        Mood and Affect: Mood normal.        Behavior: Behavior normal.        Thought Content: Thought content normal.  Judgment: Judgment normal.      Lab Results:  CBC    Component Value Date/Time   WBC 5.3 10/13/2022 0442   RBC 3.27 (L) 10/13/2022 0442   HGB 10.0 (L) 10/13/2022 0442   HGB 10.4 (L) 09/09/2022 0908   HCT 32.7 (L) 10/13/2022 0442   HCT 31.5 (L) 09/09/2022 0908   PLT 169 10/13/2022 0442   PLT 428 09/09/2022 0908   MCV 100.0 10/13/2022 0442   MCV 92 09/09/2022 0908   MCH 30.6 10/13/2022 0442   MCHC 30.6 10/13/2022 0442   RDW 15.0 10/13/2022 0442   RDW 14.7 09/09/2022 0908   LYMPHSABS 0.7 09/25/2022 1403   LYMPHSABS 0.6 (L) 09/09/2022 0908   MONOABS 0.3 09/25/2022 1403   EOSABS 0.1 09/25/2022 1403   EOSABS 0.1 09/09/2022 0908   BASOSABS 0.1 09/25/2022 1403   BASOSABS 0.1 09/09/2022 0908    BMET    Component Value Date/Time   NA 141 10/14/2022 0521   NA 140 09/09/2022 0908   K 4.0 10/14/2022 0521   CL 105 10/14/2022 0521   CO2 27 10/14/2022 0521   GLUCOSE 114 (H) 10/14/2022 0521   BUN 18 10/14/2022 0521   BUN 16 09/09/2022 0908   CREATININE 0.67 10/14/2022 0521   CREATININE 0.78 08/08/2022 1028   CALCIUM 9.4 10/14/2022 0521   GFRNONAA >60 10/14/2022 0521   GFRNONAA >60 08/08/2022 1028    BNP    Component Value Date/Time   BNP 16.2 10/12/2022 0955    ProBNP No results found for: "PROBNP"  Imaging: DG Chest 2 View  Result Date: 11/02/2022 CLINICAL DATA:  Follow-up left upper lobe  cavitary pneumonia. EXAM: CHEST - 2 VIEW COMPARISON:  Chest radiograph 10/12/2022, CTA chest 10/12/2022. FINDINGS: The cardiomediastinal silhouette is stable. Heterogeneous opacities in the left hilar/suprahilar region are persistent, not significantly changed compared to the prior radiograph from 10/12/2022. An additional smaller nodular opacity in the right upper lobe with an adjacent fiducial marker is unchanged. There is no other focal airspace disease. The lungs hyperinflated consistent with underlying COPD. There is no pulmonary edema. There is no pleural effusion or pneumothorax There is no acute osseous abnormality. IMPRESSION: 1. Persistent opacities in the left suprahilar region which remains suspicious for malignancy, not significantly changed since 10/12/2022. 2. Unchanged suspicious nodule and adjacent fiducial marker in the right upper lobe. Electronically Signed   By: Valetta Mole M.D.   On: 11/02/2022 11:59   DG Hip Unilat W or Wo Pelvis 2-3 Views Right  Result Date: 10/23/2022 CLINICAL DATA:  Fall a few days ago. Persistent right hip pain. EXAM: DG HIP (WITH OR WITHOUT PELVIS) 2-3V RIGHT COMPARISON:  None Available. FINDINGS: No acute or healing fracture of the pelvis or right hip. The femoral head is seated. Mild hip degenerative change with acetabular spurring. No erosion or avascular necrosis. Intact pubic rami. Vascular calcifications are seen. IMPRESSION: Mild degenerative change of the right hip. No fracture. Electronically Signed   By: Keith Rake M.D.   On: 10/23/2022 16:11   DG Forearm Right  Result Date: 10/19/2022 CLINICAL DATA:  Fall. EXAM: RIGHT FOREARM - 2 VIEW COMPARISON:  Right wrist 10/19/2022 FINDINGS: There is no evidence of fracture or other focal bone lesions. Soft tissues are unremarkable. IMPRESSION: Negative. Electronically Signed   By: Franchot Gallo M.D.   On: 10/19/2022 13:16   DG Hand Complete Right  Result Date: 10/19/2022 CLINICAL DATA:  Fall. EXAM:  RIGHT WRIST - COMPLETE 3+ VIEW; RIGHT HAND - COMPLETE  3+ VIEW COMPARISON:  Right wrist and hand x-rays dated September 25, 2022. FINDINGS: There is no evidence of acute fracture or dislocation. Old second metacarpal neck fracture again noted. Osteopenia. Soft tissues are unremarkable. IMPRESSION: 1. No acute osseous abnormality. Electronically Signed   By: Titus Dubin M.D.   On: 10/19/2022 13:15   DG Wrist Complete Right  Result Date: 10/19/2022 CLINICAL DATA:  Fall. EXAM: RIGHT WRIST - COMPLETE 3+ VIEW; RIGHT HAND - COMPLETE 3+ VIEW COMPARISON:  Right wrist and hand x-rays dated September 25, 2022. FINDINGS: There is no evidence of acute fracture or dislocation. Old second metacarpal neck fracture again noted. Osteopenia. Soft tissues are unremarkable. IMPRESSION: 1. No acute osseous abnormality. Electronically Signed   By: Titus Dubin M.D.   On: 10/19/2022 13:15   DG Shoulder Right  Result Date: 10/15/2022 CLINICAL DATA:  61 year old male status post unwitnessed fall. On Eliquis. Pain. EXAM: RIGHT SHOULDER - 2+ VIEW COMPARISON:  Right shoulder series 09/25/2022. FINDINGS: Two views. No glenohumeral joint dislocation. Proximal right humerus appears stable and intact. Bulky degenerative spurring at the right [facility] joint appears stable. Right clavicle and scapula appear stable and intact. Right chest or lung surgical clips again noted. Visible right ribs appear intact. IMPRESSION: No acute fracture or dislocation identified about the right shoulder. Electronically Signed   By: Genevie Ann M.D.   On: 10/15/2022 05:53   CT Head Wo Contrast  Result Date: 10/15/2022 CLINICAL DATA:  61 year old male status post unwitnessed fall. On Eliquis. EXAM: CT HEAD WITHOUT CONTRAST TECHNIQUE: Contiguous axial images were obtained from the base of the skull through the vertex without intravenous contrast. RADIATION DOSE REDUCTION: This exam was performed according to the departmental dose-optimization program which includes  automated exposure control, adjustment of the mA and/or kV according to patient size and/or use of iterative reconstruction technique. COMPARISON:  Head CT 09/25/2022.  Brain MRI 09/15/2022. FINDINGS: Brain: Relatively large patchy and confluent left hemisphere encephalomalacia with scattered areas of gyral calcification redemonstrated. Left MCA and left MCA/PCA watershed areas affected. Ex vacuo enlargement of the left lateral ventricle. Stable smaller chronic left cerebellar infarct. No midline shift, ventriculomegaly, mass effect, evidence of mass lesion, intracranial hemorrhage or evidence of cortically based acute infarction. Vascular: Calcified atherosclerosis at the skull base. Skull: Stable, intact. Sinuses/Orbits: Acute on chronic appearing right maxillary sinusitis with increased opacification there from earlier this month. Stable mild mucosal thickening elsewhere. Mild left mastoid effusion is stable. Other: Stable orbit and scalp soft tissues. IMPRESSION: 1. No acute intracranial abnormality or acute traumatic injury identified. 2. Stable chronic left cerebral and cerebellar encephalomalacia with gyral calcification. 3. Acute on chronic right maxillary sinusitis. Electronically Signed   By: Genevie Ann M.D.   On: 10/15/2022 05:52   ECHOCARDIOGRAM COMPLETE  Result Date: 10/13/2022    ECHOCARDIOGRAM REPORT   Patient Name:   Todd Estrada Date of Exam: 10/13/2022 Medical Rec #:  [id number]     Height:       73.0 in Accession #:    [id number]    Weight:       160.0 lb Date of Birth:  04/16/1961     BSA:          1.957 m Patient Age:    57 years      BP:           109/74 mmHg Patient Gender: M             HR:  80 bpm. Exam Location:  Inpatient Procedure: 2D Echo Indications:    Pulmonary Embolism  History:        Patient has prior history of Echocardiogram examinations, most                 recent 08/27/2022. COPD and Stroke.  Sonographer:    Harvie Junior Referring Phys: [id number] Jonnie Finner   Sonographer Comments: Technically difficult study due to poor echo windows, no parasternal window and no apical window. Image acquisition challenging due to respiratory motion. IMPRESSIONS  1. Left ventricular ejection fraction, by estimation, is 45 to 50%. The left ventricle has mildly decreased function. The left ventricle demonstrates global hypokinesis. Left ventricular diastolic parameters are indeterminate.  2. Right ventricular systolic function is normal. The right ventricular size is normal. There is normal pulmonary artery systolic pressure.  3. The mitral valve is normal in structure. No evidence of mitral valve regurgitation. No evidence of mitral stenosis.  4. The aortic valve is normal in structure. Aortic valve regurgitation is not visualized. No aortic stenosis is present.  5. The inferior vena cava is normal in size with greater than 50% respiratory variability, suggesting right atrial pressure of 3 mmHg. FINDINGS  Left Ventricle: Left ventricular ejection fraction, by estimation, is 45 to 50%. The left ventricle has mildly decreased function. The left ventricle demonstrates global hypokinesis. The left ventricular internal cavity size was normal in size. There is  no left ventricular hypertrophy. Left ventricular diastolic parameters are indeterminate. Right Ventricle: The right ventricular size is normal. No increase in right ventricular wall thickness. Right ventricular systolic function is normal. There is normal pulmonary artery systolic pressure. The tricuspid regurgitant velocity is 1.06 m/s, and  with an assumed right atrial pressure of 3 mmHg, the estimated right ventricular systolic pressure is 7.5 mmHg. Left Atrium: Left atrial size was normal in size. Right Atrium: Right atrial size was normal in size. Pericardium: There is no evidence of pericardial effusion. Mitral Valve: The mitral valve is normal in structure. No evidence of mitral valve regurgitation. No evidence of mitral valve  stenosis. Tricuspid Valve: The tricuspid valve is normal in structure. Tricuspid valve regurgitation is not demonstrated. No evidence of tricuspid stenosis. Aortic Valve: The aortic valve is normal in structure. Aortic valve regurgitation is not visualized. No aortic stenosis is present. Aortic valve mean gradient measures 2.0 mmHg. Aortic valve peak gradient measures 3.9 mmHg. Aortic valve area, by VTI measures 3.28 cm. Pulmonic Valve: The pulmonic valve was normal in structure. Pulmonic valve regurgitation is not visualized. No evidence of pulmonic stenosis. Aorta: The aortic root is normal in size and structure. Venous: The inferior vena cava is normal in size with greater than 50% respiratory variability, suggesting right atrial pressure of 3 mmHg. IAS/Shunts: No atrial level shunt detected by color flow Doppler.  LEFT VENTRICLE PLAX 2D LVIDd:         4.60 cm      Diastology LVIDs:         3.20 cm      LV e' lateral:   8.16 cm/s LV PW:         0.70 cm      LV E/e' lateral: 5.9 LV IVS:        0.70 cm LVOT diam:     2.30 cm LV SV:         56 LV SV Index:   29 LVOT Area:     4.15 cm  LV Volumes (MOD) LV  vol d, MOD A2C: 70.4 ml LV vol d, MOD A4C: 104.0 ml LV vol s, MOD A2C: 32.7 ml LV vol s, MOD A4C: 47.2 ml LV SV MOD A2C:     37.7 ml LV SV MOD A4C:     104.0 ml LV SV MOD BP:      46.7 ml RIGHT VENTRICLE RV S prime:     21.60 cm/s TAPSE (M-mode): 2.1 cm AORTIC VALVE AV Area (Vmax):    3.31 cm AV Area (Vmean):   3.67 cm AV Area (VTI):     3.28 cm AV Vmax:           99.10 cm/s AV Vmean:          64.300 cm/s AV VTI:            0.171 m AV Peak Grad:      3.9 mmHg AV Mean Grad:      2.0 mmHg LVOT Vmax:         79.00 cm/s LVOT Vmean:        56.800 cm/s LVOT VTI:          0.135 m LVOT/AV VTI ratio: 0.79  AORTA Ao Root diam: 3.90 cm MITRAL VALVE               TRICUSPID VALVE MV Area (PHT): 4.80 cm    TR Peak grad:   4.5 mmHg MV Decel Time: 158 msec    TR Vmax:        106.00 cm/s MV E velocity: 48.40 cm/s MV A  velocity: 75.80 cm/s  SHUNTS MV E/A ratio:  0.64        Systemic VTI:  0.14 m                            Systemic Diam: 2.30 cm Kardie Tobb DO Electronically signed by Berniece Salines DO Signature Date/Time: 10/13/2022/10:34:26 AM    Final    CT Angio Chest PE W and/or Wo Contrast  Result Date: 10/12/2022 CLINICAL DATA:  Evaluate for pulmonary embolus.  Chest pain. * Tracking Code: BO * EXAM: CT ANGIOGRAPHY CHEST WITH CONTRAST TECHNIQUE: Multidetector CT imaging of the chest was performed using the standard protocol during bolus administration of intravenous contrast. Multiplanar CT image reconstructions and MIPs were obtained to evaluate the vascular anatomy. RADIATION DOSE REDUCTION: This exam was performed according to the departmental dose-optimization program which includes automated exposure control, adjustment of the mA and/or kV according to patient size and/or use of iterative reconstruction technique. CONTRAST:  175mL OMNIPAQUE IOHEXOL 350 MG/ML SOLN COMPARISON:  09/14/2022 FINDINGS: Cardiovascular: Examination is positive for acute pulmonary embolus which originates from the left pulmonary artery and extends into a anterior segmental branch of the left upper lobe pulmonary artery, image 103/9 and image 62/5. No signs of right heart strain. Heart size is normal. No pericardial effusion. Aortic atherosclerosis and coronary artery calcifications. Mediastinum/Nodes: No enlarged mediastinal, hilar, or axillary lymph nodes. Thyroid gland, trachea, and esophagus demonstrate no significant findings. Lungs/Pleura: No pleural effusion. Centrilobular and paraseptal emphysema. Persistence of cavitary process within the anteromedial right upper lobe now extends into the apex measuring 6.7 by 3.5 x 3.2 cm, image 48/5 and image 29/8. Previously this area measured 6.5 by 4.5 x 3.3 cm. More centrally, there is a stable appearing solid-appearing nodule measuring 2.7 x 2.0 cm, image 66/5. Within the right upper lobe there  is a nodule containing fiducial marker which measures 2.2  x 1.4 cm, image 74/7. Previously 2.5 x 1.4 cm. Upper Abdomen: No acute abnormality. Musculoskeletal: Remote healed right lateral rib fracture, image 103/7. No acute or suspicious osseous findings. Review of the MIP images confirms the above findings. IMPRESSION: 1. Examination is positive for acute pulmonary embolus which originates from the left main pulmonary artery and extends into a anterior segmental branch of the left upper lobe pulmonary artery. No signs of right heart strain. 2. Persistence of cavitary process within the anteromedial right upper lobe now extends into the apex measuring 6.7 x 3.5 x 3.2 cm. Previously this area measured 6.5 x 4.5 x 3.3 cm. More centrally, there is a stable appearing solid-appearing nodule measuring 2.7 x 2.0 cm. Cannot exclude underlying malignancy. Recommend referral to pulmonary medicine or thoracic surgery for further management. 3. Within the right upper lobe there is a nodule containing fiducial marker which measures 2.2 x 1.4 cm. Previously 2.5 x 1.4 cm. Also concerning for malignancy. 4. Coronary artery calcifications. 5. Aortic Atherosclerosis (ICD10-I70.0) and Emphysema (ICD10-J43.9). Critical Value/emergent results were called by telephone at the time of interpretation on 10/12/2022 at 7:09 am to provider Wynona Dove , who verbally acknowledged these results. Electronically Signed   By: Kerby Moors M.D.   On: 10/12/2022 07:11   DG Chest 2 View  Result Date: 10/12/2022 CLINICAL DATA:  Right-sided chest pain. EXAM: CHEST - 2 VIEW COMPARISON:  Chest plain film, dated September 25, 2022, and prior chest CT, dated September 14, 2022 FINDINGS: There is limited evaluation of the lateral view secondary to positioning of the patient's upper extremity. The heart size and mediastinal contours are within normal limits. Stable appearing opacities are seen within the left perihilar and left suprahilar regions. A  stable, faint right mid lung opacity is seen with a small adjacent radiopaque marker. There is no evidence of a pleural effusion or pneumothorax. Ill-defined focal opacities are seen projecting over chronic deformities involving the anterior fifth and sixth right ribs. IMPRESSION: 1. Stable left perihilar and left suprahilar opacities which correspond to areas of suspected malignancy seen on the prior chest CT. 2. Stable, faint right mid lung opacity with a small adjacent radiopaque marker. . Electronically Signed   By: Virgina Norfolk M.D.   On: 10/12/2022 01:42     Assessment & Plan:   Emphysema lung ([place]) - Stable; Not acutely exacerbated - Continue Stiolto Respimat and budesonide nebulizer twice daily  Necrotizing pneumonia ([place]) - Necrotizing pneumonia felt to be postobstructive due to left upper lobe lung mass.  Pulmonary recommended treating with Augmentin x3 weeks and repeating imaging.  He is doing better today.  No longer experiencing chest pain or shortness of breath.  He has a chronic congested cough which is nonproductive.  We will get a plain chest film today, depending on results we will determine timeframe for CT chest.   Pulmonary embolism ([place]) - Dx with acute PE on 10/12/22 secondary to lung cancer - Continue anticoagulation with Eliquis 5mg  twice daily   Squamous cell lung cancer ([place]) - Dx in June/July 2023. Not surgical candidate. Underwent concurrent chemoradiation. S/p SBRT RUL nodule in September with Dr. Lisbeth Renshaw.    Martyn Ehrich, NP 11/02/2022

## 2022-11-02 NOTE — Assessment & Plan Note (Signed)
-   Necrotizing pneumonia felt to be postobstructive due to left upper lobe lung mass.  Pulmonary recommended treating with Augmentin x3 weeks and repeating imaging.  He is doing better today.  No longer experiencing chest pain or shortness of breath.  He has a chronic congested cough which is nonproductive.  We will get a plain chest film today, depending on results we will determine timeframe for CT chest.

## 2022-11-02 NOTE — Patient Instructions (Addendum)
Recommendations: - Continue Stiolto Respimat- take two puff daily in the morning - Continue Pulmicort nebulizer every morning and evening - Use albuterol only if having breakthrough shortness of breath/wheezing - Take robitussin OTC or mucinex tablets as needed to loosen congestion  - We will order you for CT chest in about 2-4 weeks  - Continue Eliquis 5mg  twice daily   Orders: - CXR today re: left lung pneumonia (ordered)   Follow-up: - 5-6 weeks with Dr. Erin Fulling

## 2022-11-02 NOTE — Assessment & Plan Note (Addendum)
-   Dx with acute PE on 10/12/22 secondary to lung cancer - Continue anticoagulation with Eliquis 5mg  twice daily

## 2022-11-02 NOTE — Assessment & Plan Note (Addendum)
-   Dx in June/July 2023. Not surgical candidate. Underwent concurrent chemoradiation. S/p SBRT RUL nodule in September with Dr. Lisbeth Renshaw.

## 2022-11-02 NOTE — Assessment & Plan Note (Addendum)
-   Stable; Not acutely exacerbated - Continue Stiolto Respimat and budesonide nebulizer twice daily

## 2022-11-04 ENCOUNTER — Other Ambulatory Visit: Payer: Self-pay | Admitting: Neurology

## 2022-11-04 ENCOUNTER — Other Ambulatory Visit: Payer: Self-pay | Admitting: Primary Care

## 2022-11-04 ENCOUNTER — Other Ambulatory Visit: Payer: Self-pay | Admitting: Student

## 2022-11-04 DIAGNOSIS — J432 Centrilobular emphysema: Secondary | ICD-10-CM

## 2022-11-08 ENCOUNTER — Telehealth: Payer: Self-pay | Admitting: Primary Care

## 2022-11-08 DIAGNOSIS — R911 Solitary pulmonary nodule: Secondary | ICD-10-CM

## 2022-11-08 NOTE — Telephone Encounter (Signed)
Called and spoke to patients cousin [name]. And went over chest xray results with him. He verbalized understanding. Orders placed for CT in 2 weeks. Nothing further needed

## 2022-11-11 ENCOUNTER — Encounter: Payer: Self-pay | Admitting: *Deleted

## 2022-11-18 ENCOUNTER — Telehealth: Payer: Self-pay | Admitting: Primary Care

## 2022-11-21 NOTE — Telephone Encounter (Signed)
Beth - will you please place an order w/ & without?    Thanks!

## 2022-11-22 ENCOUNTER — Ambulatory Visit (HOSPITAL_COMMUNITY)
Admission: RE | Admit: 2022-11-22 | Discharge: 2022-11-22 | Disposition: A | Discharge status: Home / Self Care | Payer: Medicaid Other | Source: Ambulatory Visit | Attending: Primary Care | Admitting: Primary Care

## 2022-11-22 DIAGNOSIS — R911 Solitary pulmonary nodule: Secondary | ICD-10-CM | POA: Insufficient documentation

## 2022-11-23 ENCOUNTER — Encounter: Payer: Self-pay | Admitting: Podiatry

## 2022-11-23 ENCOUNTER — Ambulatory Visit: Payer: Medicaid Other | Admitting: Podiatry

## 2022-11-23 DIAGNOSIS — D689 Coagulation defect, unspecified: Secondary | ICD-10-CM

## 2022-11-23 DIAGNOSIS — M79674 Pain in right toe(s): Secondary | ICD-10-CM | POA: Diagnosis not present

## 2022-11-23 DIAGNOSIS — I639 Cerebral infarction, unspecified: Secondary | ICD-10-CM

## 2022-11-23 DIAGNOSIS — M79675 Pain in left toe(s): Secondary | ICD-10-CM

## 2022-11-23 DIAGNOSIS — B351 Tinea unguium: Secondary | ICD-10-CM | POA: Diagnosis not present

## 2022-11-23 DIAGNOSIS — I739 Peripheral vascular disease, unspecified: Secondary | ICD-10-CM

## 2022-11-23 NOTE — Progress Notes (Signed)
This patient returns to my office for at risk foot care.  This patient requires this care by a professional since this patient will be at risk due to having coagulation defect and is taking plavix.  This patient is unable to cut nails himself since the patient cannot reach his nails.These nails are painful walking and wearing shoes.  This patient presents for at risk foot care today.  General Appearance  Alert, conversant and in no acute stress.  Vascular  Dorsalis pedis and posterior tibial  pulses are palpable  bilaterally.  Capillary return is within normal limits  bilaterally. Temperature is within normal limits  bilaterally.  Neurologic  Senn-Weinstein monofilament wire test within normal limits  bilaterally. Muscle power within normal limits bilaterally.  Nails Thick disfigured discolored nails with subungual debris  from hallux to fifth toes bilaterally. No evidence of bacterial infection or drainage bilaterally.  Orthopedic  No limitations of motion  feet .  No crepitus or effusions noted.  No bony pathology or digital deformities noted. Drop foot right.  Skin  normotropic skin with no porokeratosis noted bilaterally.  No signs of infections or ulcers noted.     Onychomycosis  Pain in right toes  Pain in left toes  Consent was obtained for treatment procedures.   Mechanical debridement of nails 1-5  bilaterally performed with a nail nipper.  Filed with dremel without incident.    Return office visit   3 months                   Told patient to return for periodic foot care and evaluation due to potential at risk complications.   Gardiner Barefoot DPM

## 2022-11-25 ENCOUNTER — Other Ambulatory Visit: Payer: Self-pay

## 2022-11-25 ENCOUNTER — Other Ambulatory Visit: Payer: Self-pay | Admitting: Student

## 2022-11-25 DIAGNOSIS — J432 Centrilobular emphysema: Secondary | ICD-10-CM

## 2022-11-25 MED ORDER — NICOTINE 14 MG/24HR TD PT24: MEDICATED_PATCH | TRANSDERMAL | 0 refills | Status: DC

## 2022-11-25 MED ORDER — BUDESONIDE 0.5 MG/2ML IN SUSP: RESPIRATORY_TRACT | 3 refills | Status: DC

## 2022-11-25 MED ORDER — STIOLTO RESPIMAT 2.5-2.5 MCG/ACT IN AERS: INHALATION_SPRAY | RESPIRATORY_TRACT | 3 refills | Status: DC

## 2022-11-25 NOTE — Telephone Encounter (Signed)
MED REFILL REQUEST  budesonide (PULMICORT) 0.5 MG/2ML nebulizer solution   STIOLTO RESPIMAT 2.5-2.5 MCG/ACT AERS   nicotine (NICODERM CQ - DOSED IN MG/24 HOURS) 14 mg/24hr patc    [id number] [place], [place] AT [facility] [address] Phone: [phone]  Fax: [phone]

## 2022-11-25 NOTE — Progress Notes (Signed)
Todd Estrada please let patient know CT chest showed reduction in left and right upper lung mass, new opacity suspicion for pneumonia. Can we call patient and ask if he has any acute respiratory symptoms? He has an apt with Dr. Erin Fulling next week, if doing fine can review further at that visit   Cc: Dr. Tera Partridge

## 2022-11-29 ENCOUNTER — Ambulatory Visit (INDEPENDENT_AMBULATORY_CARE_PROVIDER_SITE_OTHER): Payer: Medicaid Other | Admitting: Pulmonary Disease

## 2022-11-29 ENCOUNTER — Encounter: Payer: Self-pay | Admitting: Pulmonary Disease

## 2022-11-29 VITALS — BP 116/64 | HR 88

## 2022-11-29 DIAGNOSIS — C3490 Malignant neoplasm of unspecified part of unspecified bronchus or lung: Secondary | ICD-10-CM

## 2022-11-29 DIAGNOSIS — I2699 Other pulmonary embolism without acute cor pulmonale: Secondary | ICD-10-CM | POA: Diagnosis not present

## 2022-11-29 DIAGNOSIS — J432 Centrilobular emphysema: Secondary | ICD-10-CM

## 2022-11-29 DIAGNOSIS — T148XXA Other injury of unspecified body region, initial encounter: Secondary | ICD-10-CM

## 2022-11-29 DIAGNOSIS — L089 Local infection of the skin and subcutaneous tissue, unspecified: Secondary | ICD-10-CM

## 2022-11-29 MED ORDER — STIOLTO RESPIMAT 2.5-2.5 MCG/ACT IN AERS: INHALATION_SPRAY | RESPIRATORY_TRACT | 3 refills | Status: DC

## 2022-11-29 MED ORDER — STIOLTO RESPIMAT 2.5-2.5 MCG/ACT IN AERS: 2.0000 | INHALATION_SPRAY | Freq: Every day | RESPIRATORY_TRACT | 0 refills | Status: DC

## 2022-11-29 MED ORDER — BUDESONIDE 0.5 MG/2ML IN SUSP: RESPIRATORY_TRACT | 3 refills | Status: DC

## 2022-11-29 NOTE — Patient Instructions (Signed)
Continue stiolto 2 puffs daily  Continue budesonide nebs twice daily  Use albuterol inhaler as needed  Your CT scan does show improvement since October.   Follow up in 6 months.

## 2022-11-29 NOTE — Progress Notes (Signed)
Synopsis: Referred in May 2023 for hospital follow up   Subjective:   PATIENT ID: Todd Estrada GENDER: male DOB: 1961-05-12, MRN: [id number]   HPI  Chief Complaint  Patient presents with   Follow-up    F/U after CT scan. States his breathing has been stable since last visit.    Todd Estrada is a 61 year old male, recent former smoker with history of hypertension and CVA who returns to pulmonary clinic for emphysema and lung cancer.   He underwent bronchoscoyp 05/30/22 and diagnosed with squamous cell carcinoma of the lung. He was started on concurrent chemoradiation 7/17 to 9/14. He has had multiple ER visits due to falls and pulmonary embolism and started on eliquis.   He was treated for necrotizing pneumonia with 3 weeks of augmentin.   He is feeling better. He has swelling and pain of his right hand. His cousin has been treating a wound in the crease of the thumb.   OV 05/04/22 He has been using stiolto and budesonide since discharge and reports his breathing has been fine. 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   He had tracheostomy placed on 3/21 during his admission for difficulty weaning off the ventilator along with multiple intubations.  He was decannulated 4/24 while in rehab.  He has quit smoking since his admission.  He was smoking 1 to 1.5 packs for 50 years.  CT chest scan 03/18/2022 showed moderate to severe emphysema.  There is 2.7 x 3.7 cm lobulated spiculated mass within the left upper lobe.  There is a 16 x 20 mm spiculated nodule within the posterior segment of the right upper lobe.  He denies any hemoptysis or weight loss.  His father had pancreatic cancer.  His uncle had stage IV lung cancer.  He has a an aunt with breast cancer.   Past Medical History:  Diagnosis Date   Asthma    COPD (chronic obstructive pulmonary disease) ([place])    Essential hypertension 08/19/2021   GERD (gastroesophageal reflux  disease)    History of tracheostomy    03/09/22-04/11/22   HLD (hyperlipidemia)    Hypertension    Lung cancer (HCC)    PAD (peripheral artery disease) (HCC)    Seizures ([place]) 06/02/2022   Stroke ([place]) 02/2022     Family History  Problem Relation Age of Onset   Throat cancer Mother    Liver cancer Father    Kidney failure Sister    Cancer - Lung Paternal Uncle      Social History   Socioeconomic History   Marital status: Widowed    Spouse name: Not on file   Number of children: Not on file   Years of education: Not on file   Highest education level: Not on file  Occupational History   Not on file  Tobacco Use   Smoking status: Former    Types: Cigarettes    Quit date: 05/02/2022    Years since quitting: 0.5    Passive exposure: Never   Smokeless tobacco: Never  Vaping Use   Vaping Use: Never used  Substance and Sexual Activity   Alcohol use: Not Currently   Drug use: No   Sexual activity: Not on file  Other Topics Concern   Not on file  Social History Narrative   Not on file   Social Determinants of Health   Financial Resource Strain: Not on file  Food Insecurity: No  Food Insecurity (10/12/2022)   Hunger Vital Sign    Worried About Running Out of Food in the Last Year: Never true    Ran Out of Food in the Last Year: Never true  Transportation Needs: No Transportation Needs (10/12/2022)   PRAPARE - Hydrologist (Medical): No    Lack of Transportation (Non-Medical): No  Physical Activity: Not on file  Stress: Not on file  Social Connections: Not on file  Intimate Partner Violence: Not At Risk (10/12/2022)   Humiliation, Afraid, Rape, and Kick questionnaire    Fear of Current or Ex-Partner: No    Emotionally Abused: No    Physically Abused: No    Sexually Abused: No     No Known Allergies   Outpatient Medications Prior to Visit  Medication Sig Dispense Refill   albuterol (PROVENTIL) (2.5 MG/3ML) 0.083% nebulizer solution  Take 3 mLs (2.5 mg total) by nebulization every 4 (four) hours as needed for wheezing or shortness of breath. 90 mL 12   apixaban (ELIQUIS) 5 MG TABS tablet Take 1 tablet (5 mg total) by mouth 2 (two) times daily. 60 tablet 2   atorvastatin (LIPITOR) 40 MG tablet Take 1 tablet (40 mg total) by mouth daily. 90 tablet 3   clopidogrel (PLAVIX) 75 MG tablet Take 1 tablet (75 mg total) by mouth daily. Okay to restart this medication on 05/31/2022 (Patient taking differently: Take 75 mg by mouth daily.) 90 tablet 3   levETIRAcetam (KEPPRA) 500 MG tablet NEW PRESCRIPTION REQUEST: LEVETIRACETAM 500 MG- TAKE ONE TABLET BY MOUTH TWICE DAILY 180 tablet 3   midodrine (PROAMATINE) 2.5 MG tablet Take 1 tablet (2.5 mg total) by mouth 3 (three) times daily with meals. 90 tablet 5   Multiple Vitamin (MULTIVITAMIN WITH MINERALS) TABS tablet Take 1 tablet by mouth daily. 30 tablet 2   ondansetron (ZOFRAN) 4 MG tablet Take 4 mg by mouth every 8 (eight) hours as needed for nausea or vomiting.     budesonide (PULMICORT) 0.5 MG/2ML nebulizer solution NEW PRESCRIPTION REQEUST: BUDESONIDE 0.5 MG/ 2ML- USE ONE VIAL TWICE DAILY 053 mL 3   folic acid (FOLVITE) 1 MG tablet Take 1 tablet (1 mg total) by mouth daily. 30 tablet 2   Tiotropium Bromide-Olodaterol (STIOLTO RESPIMAT) 2.5-2.5 MCG/ACT AERS NEW PRESCRIPTION REQUEST: STIOLTO 2.5 MCG- INHALE TWO PUFFS BY MOUTH DAILY 12 g 3   apixaban (ELIQUIS) 5 MG TABS tablet Take 2 tablets (10 mg total) by mouth 2 (two) times daily for 7 days. 28 tablet 0   APIXABAN (ELIQUIS) VTE STARTER PACK (10MG  AND 5MG ) Take as directed on package: start with two-5mg  tablets twice daily for 7 days. On day 8, switch to one-5mg  tablet twice daily. 74 each 0   nicotine (NICODERM CQ - DOSED IN MG/24 HOURS) 14 mg/24hr patch APPLY 1 PATCH(14 MG) TOPICALLY TO THE SKIN DAILY 28 patch 0   No facility-administered medications prior to visit.   Review of Systems  Constitutional:  Negative for chills, fever,  malaise/fatigue and weight loss.  HENT:  Negative for congestion, sinus pain and sore throat.   Eyes: Negative.   Respiratory:  Positive for cough, sputum production and shortness of breath. Negative for hemoptysis and wheezing.   Cardiovascular:  Negative for chest pain, palpitations, orthopnea, claudication and leg swelling.  Gastrointestinal:  Negative for abdominal pain, heartburn, nausea and vomiting.  Genitourinary: Negative.   Musculoskeletal:  Negative for joint pain and myalgias.  Skin:  Negative for rash.  Neurological:  Positive for speech change and focal weakness. Negative for weakness.  Endo/Heme/Allergies: Negative.   Psychiatric/Behavioral: Negative.      Objective:   Vitals:   11/29/22 1314  BP: 116/64  Pulse: 88  SpO2: 99%     Physical Exam Constitutional:      General: He is not in acute distress. HENT:     Head: Normocephalic and atraumatic.  Eyes:     Conjunctiva/sclera: Conjunctivae normal.  Cardiovascular:     Rate and Rhythm: Normal rate and regular rhythm.     Pulses: Normal pulses.     Heart sounds: Normal heart sounds. No murmur heard. Pulmonary:     Effort: Pulmonary effort is normal.     Breath sounds: Decreased air movement present. No wheezing, rhonchi or rales.  Abdominal:     General: Bowel sounds are normal.     Palpations: Abdomen is soft.  Musculoskeletal:     Right lower leg: No edema.     Left lower leg: No edema.  Skin:    General: Skin is warm and dry.  Neurological:     General: No focal deficit present.     Mental Status: He is alert.    CBC    Component Value Date/Time   WBC 4.8 12/02/2022 0303   RBC 3.99 (L) 12/02/2022 0303   HGB 12.1 (L) 12/02/2022 0303   HGB 10.4 (L) 09/09/2022 0908   HCT 37.2 (L) 12/02/2022 0303   HCT 31.5 (L) 09/09/2022 0908   PLT 119 (L) 12/02/2022 0303   PLT 428 09/09/2022 0908   MCV 93.2 12/02/2022 0303   MCV 92 09/09/2022 0908   MCH 30.3 12/02/2022 0303   MCHC 32.5 12/02/2022 0303    RDW 14.0 12/02/2022 0303   RDW 14.7 09/09/2022 0908   LYMPHSABS 0.4 (L) 12/02/2022 0303   LYMPHSABS 0.6 (L) 09/09/2022 0908   MONOABS 0.5 12/02/2022 0303   EOSABS 0.0 12/02/2022 0303   EOSABS 0.1 09/09/2022 0908   BASOSABS 0.0 12/02/2022 0303   BASOSABS 0.1 09/09/2022 0908      Latest Ref Rng & Units 12/02/2022    3:03 AM 10/14/2022    5:21 AM 10/13/2022    4:42 AM  BMP  Glucose 70 - 99 mg/dL 95  114  102   BUN 8 - 23 mg/dL 18  18  21    Creatinine 0.61 - 1.24 mg/dL 1.07  0.67  0.78   Sodium 135 - 145 mmol/L 136  141  140   Potassium 3.5 - 5.1 mmol/L 3.7  4.0  4.2   Chloride 98 - 111 mmol/L 97  105  107   CO2 22 - 32 mmol/L 28  27  26    Calcium 8.9 - 10.3 mg/dL 9.2  9.4  9.1    Chest imaging: CT Chest 11/22/22 1. Considerable reduction in size of the medial left upper lobe mass, currently 4.2 by 1.5 cm, previously 4.0 by 3.4 cm. 2. The dominant right upper lobe nodule has also reduced in size, currently 1.1 by 0.9 cm. 3. New peripheral airspace opacity posteriorly in the left lower lobe, suspicious for pneumonia. 4. Similar appearance of healing right lateral fourth, sixth, seventh, and tenth rib fractures. 5. Airway thickening is present, suggesting bronchitis or reactive airways disease. 6. Coronary, aortic arch, and branch vessel atherosclerotic vascular disease. 7. Severe centrilobular emphysema.  CT Chest w contrast 03/18/22 Bilateral upper lobe lobulated/spiculated masses suspicious for synchronous bronchogenic neoplasm. PET CT examination is recommended for further evaluation. Associated left  hilar pathologic adenopathy.   13 mm indeterminate rounded nodule within the left lower lobe. Further follow-up will be dictated by surveillance imaging for above-mentioned pulmonary masses.   Moderate to severe emphysema.   Extensive coronary artery calcification.  PFT:     No data to display          Labs:  Path:  Echo:  Heart  Catheterization:  Assessment & Plan:   Infected wound  Squamous cell carcinoma of lung, unspecified laterality (HCC)  Centrilobular emphysema ([place]) - Plan: Tiotropium Bromide-Olodaterol (STIOLTO RESPIMAT) 2.5-2.5 MCG/ACT AERS, budesonide (PULMICORT) 0.5 MG/2ML nebulizer solution  Pulmonary embolism, other, unspecified chronicity, unspecified whether acute cor pulmonale present [facility])  Discussion: Long Brimage is a 61 year old male, recent former smoker with history of hypertension and CVA who returns to pulmonary clinic for emphysema and lung cancer.   He is to continue Stiolto 2 puffs daily and budesonide nebulizer treatments twice daily.  He appears to have infected wound of his right hand and is to take doxycycline twice daily for 10 days.   He is following with oncology regarding his lung cancer.   Follow up in 6 months.  Todd Jackson, MD [facility] Office: [phone]   Current Outpatient Medications:    albuterol (PROVENTIL) (2.5 MG/3ML) 0.083% nebulizer solution, Take 3 mLs (2.5 mg total) by nebulization every 4 (four) hours as needed for wheezing or shortness of breath., Disp: 90 mL, Rfl: 12   apixaban (ELIQUIS) 5 MG TABS tablet, Take 1 tablet (5 mg total) by mouth 2 (two) times daily., Disp: 60 tablet, Rfl: 2   atorvastatin (LIPITOR) 40 MG tablet, Take 1 tablet (40 mg total) by mouth daily., Disp: 90 tablet, Rfl: 3   clopidogrel (PLAVIX) 75 MG tablet, Take 1 tablet (75 mg total) by mouth daily. Okay to restart this medication on 05/31/2022 (Patient taking differently: Take 75 mg by mouth daily.), Disp: 90 tablet, Rfl: 3   levETIRAcetam (KEPPRA) 500 MG tablet, NEW PRESCRIPTION REQUEST: LEVETIRACETAM 500 MG- TAKE ONE TABLET BY MOUTH TWICE DAILY, Disp: 180 tablet, Rfl: 3   midodrine (PROAMATINE) 2.5 MG tablet, Take 1 tablet (2.5 mg total) by mouth 3 (three) times daily with meals., Disp: 90 tablet, Rfl: 5   Multiple Vitamin (MULTIVITAMIN WITH MINERALS)  TABS tablet, Take 1 tablet by mouth daily., Disp: 30 tablet, Rfl: 2   ondansetron (ZOFRAN) 4 MG tablet, Take 4 mg by mouth every 8 (eight) hours as needed for nausea or vomiting., Disp: , Rfl:    Tiotropium Bromide-Olodaterol (STIOLTO RESPIMAT) 2.5-2.5 MCG/ACT AERS, Inhale 2 puffs into the lungs daily., Disp: 4 g, Rfl: 0   budesonide (PULMICORT) 0.5 MG/2ML nebulizer solution, NEW PRESCRIPTION REQEUST: BUDESONIDE 0.5 MG/ 2ML- USE ONE VIAL TWICE DAILY, Disp: 180 mL, Rfl: 3   doxycycline (VIBRAMYCIN) 100 MG capsule, Take 1 capsule (100 mg total) by mouth 2 (two) times daily. One po bid x 7 days, Disp: 14 capsule, Rfl: 0   folic acid (FOLVITE) 1 MG tablet, Take 1 tablet (1 mg total) by mouth daily., Disp: 30 tablet, Rfl: 2   oseltamivir (TAMIFLU) 75 MG capsule, Take 1 capsule (75 mg total) by mouth every 12 (twelve) hours., Disp: 10 capsule, Rfl: 0   Tiotropium Bromide-Olodaterol (STIOLTO RESPIMAT) 2.5-2.5 MCG/ACT AERS, NEW PRESCRIPTION REQUEST: STIOLTO 2.5 MCG- INHALE TWO PUFFS BY MOUTH DAILY, Disp: 12 g, Rfl: 3

## 2022-11-30 ENCOUNTER — Other Ambulatory Visit: Payer: Self-pay

## 2022-11-30 MED ORDER — FOLIC ACID 1 MG PO TABS: 1.0000 mg | ORAL_TABLET | Freq: Every day | ORAL | 2 refills | Status: DC

## 2022-12-02 ENCOUNTER — Emergency Department (HOSPITAL_COMMUNITY): Payer: Medicaid Other

## 2022-12-02 ENCOUNTER — Emergency Department (HOSPITAL_COMMUNITY)
Admission: EM | Admit: 2022-12-02 | Discharge: 2022-12-02 | Disposition: A | Discharge status: Home / Self Care | Payer: Medicaid Other | Attending: Emergency Medicine | Admitting: Emergency Medicine

## 2022-12-02 DIAGNOSIS — W19XXXA Unspecified fall, initial encounter: Secondary | ICD-10-CM

## 2022-12-02 DIAGNOSIS — J101 Influenza due to other identified influenza virus with other respiratory manifestations: Secondary | ICD-10-CM | POA: Insufficient documentation

## 2022-12-02 DIAGNOSIS — Z20822 Contact with and (suspected) exposure to covid-19: Secondary | ICD-10-CM | POA: Insufficient documentation

## 2022-12-02 DIAGNOSIS — M6281 Muscle weakness (generalized): Secondary | ICD-10-CM | POA: Diagnosis not present

## 2022-12-02 DIAGNOSIS — J111 Influenza due to unidentified influenza virus with other respiratory manifestations: Secondary | ICD-10-CM

## 2022-12-02 DIAGNOSIS — Z043 Encounter for examination and observation following other accident: Secondary | ICD-10-CM | POA: Diagnosis present

## 2022-12-02 DIAGNOSIS — W06XXXA Fall from bed, initial encounter: Secondary | ICD-10-CM | POA: Diagnosis not present

## 2022-12-02 DIAGNOSIS — Z7901 Long term (current) use of anticoagulants: Secondary | ICD-10-CM | POA: Insufficient documentation

## 2022-12-02 DIAGNOSIS — Z7902 Long term (current) use of antithrombotics/antiplatelets: Secondary | ICD-10-CM | POA: Diagnosis not present

## 2022-12-02 DIAGNOSIS — Y92003 Bedroom of unspecified non-institutional (private) residence as the place of occurrence of the external cause: Secondary | ICD-10-CM | POA: Diagnosis not present

## 2022-12-02 DIAGNOSIS — H509 Unspecified strabismus: Secondary | ICD-10-CM | POA: Insufficient documentation

## 2022-12-02 LAB — COMPREHENSIVE METABOLIC PANEL
ALT: 20 U/L (ref 0–44)
AST: 32 U/L (ref 15–41)
Albumin: 3.8 g/dL (ref 3.5–5.0)
Alkaline Phosphatase: 81 U/L (ref 38–126)
Anion gap: 11 (ref 5–15)
BUN: 18 mg/dL (ref 8–23)
CO2: 28 mmol/L (ref 22–32)
Calcium: 9.2 mg/dL (ref 8.9–10.3)
Chloride: 97 mmol/L — ABNORMAL LOW (ref 98–111)
Creatinine, Ser: 1.07 mg/dL (ref 0.61–1.24)
GFR, Estimated: >60 mL/min (ref >60)
Glucose, Bld: 95 mg/dL (ref 70–99)
Potassium: 3.7 mmol/L (ref 3.5–5.1)
Sodium: 136 mmol/L (ref 135–145)
Total Bilirubin: 0.6 mg/dL (ref 0.3–1.2)
Total Protein: 7.4 g/dL (ref 6.5–8.1)

## 2022-12-02 LAB — CBC WITH DIFFERENTIAL/PLATELET
Abs Immature Granulocytes: 0.02 10*3/uL (ref 0.00–0.07)
Basophils Absolute: 0 10*3/uL (ref 0.0–0.1)
Basophils Relative: 0 %
Eosinophils Absolute: 0 10*3/uL (ref 0.0–0.5)
Eosinophils Relative: 0 %
HCT: 37.2 % — ABNORMAL LOW (ref 39.0–52.0)
Hemoglobin: 12.1 g/dL — ABNORMAL LOW (ref 13.0–17.0)
Immature Granulocytes: 0 %
Lymphocytes Relative: 9 %
Lymphs Abs: 0.4 10*3/uL — ABNORMAL LOW (ref 0.7–4.0)
MCH: 30.3 pg (ref 26.0–34.0)
MCHC: 32.5 g/dL (ref 30.0–36.0)
MCV: 93.2 fL (ref 80.0–100.0)
Monocytes Absolute: 0.5 10*3/uL (ref 0.1–1.0)
Monocytes Relative: 10 %
Neutro Abs: 3.9 10*3/uL (ref 1.7–7.7)
Neutrophils Relative %: 81 %
Platelets: 119 10*3/uL — ABNORMAL LOW (ref 150–400)
RBC: 3.99 MIL/uL — ABNORMAL LOW (ref 4.22–5.81)
RDW: 14 % (ref 11.5–15.5)
WBC: 4.8 10*3/uL (ref 4.0–10.5)
nRBC: 0 % (ref 0.0–0.2)

## 2022-12-02 LAB — APTT: aPTT: 37 seconds — ABNORMAL HIGH (ref 24–36)

## 2022-12-02 LAB — PROTIME-INR
INR: 1.5 — ABNORMAL HIGH (ref 0.8–1.2)
Prothrombin Time: 18.3 seconds — ABNORMAL HIGH (ref 11.4–15.2)

## 2022-12-02 LAB — RESP PANEL BY RT-PCR (RSV, FLU A&B, COVID)  RVPGX2
Influenza A by PCR: POSITIVE — AB
Influenza B by PCR: NEGATIVE
Resp Syncytial Virus by PCR: NEGATIVE
SARS Coronavirus 2 by RT PCR: NEGATIVE

## 2022-12-02 LAB — LACTIC ACID, PLASMA: Lactic Acid, Venous: 1.3 mmol/L (ref 0.5–1.9)

## 2022-12-02 MED ORDER — OSELTAMIVIR PHOSPHATE 75 MG PO CAPS
75.0000 mg | ORAL_CAPSULE | Freq: Once | ORAL | Status: AC
  Administered 2022-12-02: 75 mg via ORAL
  Filled 2022-12-02: qty 1

## 2022-12-02 MED ORDER — ACETAMINOPHEN 500 MG PO TABS
1000.0000 mg | ORAL_TABLET | Freq: Once | ORAL | Status: AC
  Administered 2022-12-02: 1000 mg via ORAL
  Filled 2022-12-02: qty 2

## 2022-12-02 MED ORDER — LACTATED RINGERS IV BOLUS
1000.0000 mL | Freq: Once | INTRAVENOUS | Status: AC
  Administered 2022-12-02: 1000 mL via INTRAVENOUS

## 2022-12-02 MED ORDER — OSELTAMIVIR PHOSPHATE 75 MG PO CAPS: 75.0000 mg | ORAL_CAPSULE | Freq: Two times a day (BID) | ORAL | 0 refills | Status: DC

## 2022-12-02 MED ORDER — IOHEXOL 350 MG/ML SOLN
75.0000 mL | Freq: Once | INTRAVENOUS | Status: AC | PRN
  Administered 2022-12-02: 75 mL via INTRAVENOUS

## 2022-12-02 MED ORDER — VANCOMYCIN HCL 1500 MG/300ML IV SOLN
1500.0000 mg | Freq: Once | INTRAVENOUS | Status: DC
  Filled 2022-12-02: qty 300

## 2022-12-02 MED ORDER — DOXYCYCLINE HYCLATE 100 MG PO CAPS: 100.0000 mg | ORAL_CAPSULE | Freq: Two times a day (BID) | ORAL | 0 refills | Status: DC

## 2022-12-02 MED ORDER — SODIUM CHLORIDE 0.9 % IV SOLN
2.0000 g | Freq: Once | INTRAVENOUS | Status: AC
  Administered 2022-12-02: 2 g via INTRAVENOUS
  Filled 2022-12-02: qty 12.5

## 2022-12-02 NOTE — ED Notes (Signed)
[facility] arrived for transport

## 2022-12-02 NOTE — ED Notes (Signed)
[facility] called 

## 2022-12-02 NOTE — ED Provider Notes (Signed)
[facility] EMERGENCY DEPARTMENT Provider Note   CSN: [id number] Arrival date & time: 12/02/22  0246     History  Chief Complaint  Patient presents with   Zaven Klemens is a 61 y.o. male.  61 year old male that came in via EMS secondary to a fall at home.  Patient is nonverbal at baseline is unable to offer much history EMS states that whoever he lives was that he fell and had some bleeding from his nose they called EMS.  Not on any blood thinners.  Has history of stroke and some right-sided deficits related to it.  Not able to get family member on the phone to corroborate any information.   Fall       Home Medications Prior to Admission medications   Medication Sig Start Date End Date Taking? Authorizing Provider  doxycycline (VIBRAMYCIN) 100 MG capsule Take 1 capsule (100 mg total) by mouth 2 (two) times daily. One po bid x 7 days 12/02/22  Yes Davion Meara, Corene Cornea, MD  oseltamivir (TAMIFLU) 75 MG capsule Take 1 capsule (75 mg total) by mouth every 12 (twelve) hours. 12/02/22  Yes Nohemy Koop, Corene Cornea, MD  albuterol (PROVENTIL) (2.5 MG/3ML) 0.083% nebulizer solution Take 3 mLs (2.5 mg total) by nebulization every 4 (four) hours as needed for wheezing or shortness of breath. 08/16/22   Lacinda Axon, MD  apixaban (ELIQUIS) 5 MG TABS tablet Take 1 tablet (5 mg total) by mouth 2 (two) times daily. 10/21/22   Barb Merino, MD  atorvastatin (LIPITOR) 40 MG tablet Take 1 tablet (40 mg total) by mouth daily. 05/25/22 05/25/23  Lacinda Axon, MD  budesonide (PULMICORT) 0.5 MG/2ML nebulizer solution NEW PRESCRIPTION REQEUST: BUDESONIDE 0.5 MG/ 2ML- USE ONE VIAL TWICE DAILY 11/29/22   Freddi Starr, MD  clopidogrel (PLAVIX) 75 MG tablet Take 1 tablet (75 mg total) by mouth daily. Okay to restart this medication on 05/31/2022 Patient taking differently: Take 75 mg by mouth daily. 06/28/22   Gaylan Gerold, DO  folic acid (FOLVITE) 1 MG tablet Take 1 tablet (1 mg  total) by mouth daily. 11/30/22   [name], MD  levETIRAcetam (KEPPRA) 500 MG tablet NEW PRESCRIPTION REQUEST: LEVETIRACETAM 500 MG- TAKE ONE TABLET BY MOUTH TWICE DAILY 11/07/22   Marcial Pacas, MD  midodrine (PROAMATINE) 2.5 MG tablet Take 1 tablet (2.5 mg total) by mouth 3 (three) times daily with meals. 10/20/22 04/18/23  [name], MD  Multiple Vitamin (MULTIVITAMIN WITH MINERALS) TABS tablet Take 1 tablet by mouth daily. 09/20/22   Gaylan Gerold, DO  ondansetron (ZOFRAN) 4 MG tablet Take 4 mg by mouth every 8 (eight) hours as needed for nausea or vomiting.    [provider]  Tiotropium Bromide-Olodaterol (STIOLTO RESPIMAT) 2.5-2.5 MCG/ACT AERS NEW PRESCRIPTION REQUEST: STIOLTO 2.5 MCG- INHALE TWO PUFFS BY MOUTH DAILY 11/29/22   Freddi Starr, MD  Tiotropium Bromide-Olodaterol (STIOLTO RESPIMAT) 2.5-2.5 MCG/ACT AERS Inhale 2 puffs into the lungs daily. 11/29/22   Freddi Starr, MD      Allergies    Patient has no known allergies.    Review of Systems   Review of Systems  Physical Exam Updated Vital Signs BP 99/63 (BP Location: Left Arm)   Pulse 80   Temp 99.5 F (37.5 C) (Oral)   Resp 20   SpO2 93%  Physical Exam Vitals and nursing note reviewed.  Constitutional:      Appearance: He is well-developed.  HENT:  Head: Normocephalic and atraumatic.  Eyes:     Pupils: Pupils are equal, round, and reactive to light.  Cardiovascular:     Rate and Rhythm: Normal rate.  Pulmonary:     Effort: Pulmonary effort is normal. No respiratory distress.  Abdominal:     General: Abdomen is flat. There is no distension.  Musculoskeletal:        General: Normal range of motion.     Cervical back: Normal range of motion.  Neurological:     Mental Status: He is alert. Mental status is at baseline.     Comments: Right-sided arm weakness. Nonverbal. Strabismus.     ED Results / Procedures / Treatments   Labs (all labs ordered are listed, but only abnormal  results are displayed) Labs Reviewed  RESP PANEL BY RT-PCR (RSV, FLU A&B, COVID)  RVPGX2 - Abnormal; Notable for the following components:      Result Value   Influenza A by PCR POSITIVE (*)    All other components within normal limits  COMPREHENSIVE METABOLIC PANEL - Abnormal; Notable for the following components:   Chloride 97 (*)    All other components within normal limits  CBC WITH DIFFERENTIAL/PLATELET - Abnormal; Notable for the following components:   RBC 3.99 (*)    Hemoglobin 12.1 (*)    HCT 37.2 (*)    Platelets 119 (*)    Lymphs Abs 0.4 (*)    All other components within normal limits  PROTIME-INR - Abnormal; Notable for the following components:   Prothrombin Time 18.3 (*)    INR 1.5 (*)    All other components within normal limits  APTT - Abnormal; Notable for the following components:   aPTT 37 (*)    All other components within normal limits  CULTURE, BLOOD (ROUTINE X 2)  CULTURE, BLOOD (ROUTINE X 2)  URINE CULTURE  LACTIC ACID, PLASMA  URINALYSIS, ROUTINE W REFLEX MICROSCOPIC    EKG None  Radiology CT CHEST ABDOMEN PELVIS W CONTRAST  Result Date: 12/02/2022 CLINICAL DATA:  61 year old male with history of trauma from a fall. Injury to head on night stand. History of squamous cell carcinoma of the lung. * Tracking Code: BO * EXAM: CT CHEST, ABDOMEN, AND PELVIS WITH CONTRAST TECHNIQUE: Multidetector CT imaging of the chest, abdomen and pelvis was performed following the standard protocol during bolus administration of intravenous contrast. RADIATION DOSE REDUCTION: This exam was performed according to the departmental dose-optimization program which includes automated exposure control, adjustment of the mA and/or kV according to patient size and/or use of iterative reconstruction technique. CONTRAST:  55mL OMNIPAQUE IOHEXOL 350 MG/ML SOLN COMPARISON:  Chest CT 11/22/2022. FINDINGS: CT CHEST FINDINGS Cardiovascular: No abnormal high attenuation fluid within the  mediastinum to suggest posttraumatic mediastinal hematoma. No evidence of posttraumatic aortic dissection/transection. Heart size is normal. There is no significant pericardial fluid, thickening or pericardial calcification. There is aortic atherosclerosis, as well as atherosclerosis of the great vessels of the mediastinum and the coronary arteries, including calcified atherosclerotic plaque in the left main, left anterior descending, left circumflex and right coronary arteries. Mediastinum/Nodes: No pathologically enlarged mediastinal or hilar lymph nodes. Esophagus is unremarkable in appearance. No axillary lymphadenopathy. Lungs/Pleura: No pneumothorax. No acute consolidative airspace disease. No pleural effusions. Some peripheral consolidative changes are noted in the posterior aspect of the left lower lobe, similar to the recent prior examination. No new acute airspace consolidation is evident. Fiducial markers in the periphery of the right upper lobe at site of treated  pulmonary nodule, which currently measures 2.0 x 1.3 cm (axial image 76 of series 4), previously 1.8 x 0.9 cm). Treated lesion in the medial aspect of the left upper lobe shows further contraction compared to the prior study with residual nodular components measuring 2.9 x 1.2 cm (axial image 59 of series 4), and 3.3 x 2.2 cm (axial image 71 of series 4), both similar to the prior examination. Diffuse bronchial wall thickening with moderate centrilobular and paraseptal emphysema. Musculoskeletal: Healing nondisplaced fracture of the anterolateral aspect of the right fourth rib, and old healed nondisplaced fractures of the lateral right seventh rib and lateral right tenth rib. No definite acute displaced fractures or aggressive appearing lytic or blastic lesions are noted in the visualized portions of the skeleton. Chronic mild compression of left side of superior endplate of G40 with less than 10% loss of height, unchanged. CT ABDOMEN PELVIS  FINDINGS Hepatobiliary: No evidence of significant acute traumatic injury to the liver. No suspicious cystic or solid hepatic lesions. No intra or extrahepatic biliary ductal dilatation. Gallbladder is unremarkable in appearance. Pancreas: No evidence of significant acute traumatic injury to the pancreas. No pancreatic mass. No pancreatic ductal dilatation. No pancreatic or peripancreatic fluid collections or inflammatory changes. Spleen: No evidence of significant acute traumatic injury to the spleen. Unremarkable. Adrenals/Urinary Tract: No evidence of significant acute traumatic injury to either kidney or adrenal gland. Calcifications in both renal hila apparently vascular. No suspicious renal lesions. Bilateral adrenal glands are normal in appearance. No hydroureteronephrosis. Urinary bladder is intact and normal in appearance. Stomach/Bowel: No definitive evidence to suggest significant acute traumatic injury to the hollow viscera. The appearance of the stomach is normal. No pathologic dilatation of small bowel or colon. Normal appendix. Vascular/Lymphatic: No evidence of significant acute traumatic injury to the abdominal aorta or major arteries/veins of the abdomen and pelvis. Atherosclerosis in the abdominal aorta and pelvic vasculature with what appears to be chronic occlusion of the left common iliac artery. There is flow reconstituted distally in the left external iliac artery and other distal branches, presumably from collateral vessels. No lymphadenopathy noted in the abdomen or pelvis. Reproductive: Prostate gland and seminal vesicles are unremarkable in appearance. Other: No high attenuation fluid collection in the peritoneal cavity or retroperitoneum to suggest significant posttraumatic hemorrhage. No significant volume of ascites. No pneumoperitoneum. Musculoskeletal: There are no acute displaced fractures or aggressive appearing lytic or blastic lesions noted in the visualized portions of the  skeleton. IMPRESSION: 1. No evidence of significant acute traumatic injury to the chest, abdomen or pelvis. 2. Treated left upper lobe and right upper lobe lesions are stable to slightly decreased in size compared to the prior examination. No new signs of metastatic disease are confidently identified on today's examination. 3. Persistent peripheral airspace consolidation in the posterior left lower lobe. This remains concerning for probable infection, although the focal distribution is rather unusual, and the possibility of evolving postradiation changes is not excluded, although this area appears well separated from the treated lesions in either of the upper lobes. Correlation with history of radiation therapy is recommended. 4. Aortic atherosclerosis, in addition to left main and three-vessel coronary artery disease. Please note that although the presence of coronary artery calcium documents the presence of coronary artery disease, the severity of this disease and any potential stenosis cannot be assessed on this non-gated CT examination. Assessment for potential risk factor modification, dietary therapy or pharmacologic therapy may be warranted, if clinically indicated. 5. Diffuse bronchial wall thickening with  moderate centrilobular and paraseptal emphysema; imaging findings suggestive of underlying COPD. 6. Additional incidental findings, as above, similar to prior studies. Electronically Signed   By: Vinnie Langton M.D.   On: 12/02/2022 06:19   CT Head Wo Contrast  Result Date: 12/02/2022 CLINICAL DATA:  Fall getting out of bed, on blood thinners. EXAM: CT HEAD WITHOUT CONTRAST CT MAXILLOFACIAL WITHOUT CONTRAST CT CERVICAL SPINE WITHOUT CONTRAST TECHNIQUE: Multidetector CT imaging of the head, cervical spine, and maxillofacial structures were performed using the standard protocol without intravenous contrast. Multiplanar CT image reconstructions of the cervical spine and maxillofacial structures were  also generated. RADIATION DOSE REDUCTION: This exam was performed according to the departmental dose-optimization program which includes automated exposure control, adjustment of the mA and/or kV according to patient size and/or use of iterative reconstruction technique. COMPARISON:  Head CT 10/15/2022 FINDINGS: CT HEAD FINDINGS Brain: No evidence of acute infarction, hemorrhage, hydrocephalus, extra-axial collection or mass lesion/mass effect. Remote left MCA territory infarct with upper division dystrophic calcification. Patchy chronic infarcts also in the left cerebellum. Vascular: No hyperdense vessel or unexpected calcification. Skull: Negative for fracture CT MAXILLOFACIAL FINDINGS Osseous: No fracture or mandibular dislocation. No destructive process. Orbits: No visible injury. Sinuses: Chronic right maxillary sinusitis with complete opacification and atelectasis. Soft tissues: Negative CT CERVICAL SPINE FINDINGS Alignment: Negative Skull base and vertebrae: No acute fracture. No primary bone lesion or focal pathologic process. Soft tissues and spinal canal: No prevertebral fluid or swelling. No visible canal hematoma. Disc levels: Ordinary degenerative changes without visible cord impingement Upper chest: Negative IMPRESSION: 1. No evidence of acute intracranial or cervical spine injury. Negative for facial fracture. 2. Remote left MCA and left cerebellar infarcts. Electronically Signed   By: Jorje Guild M.D.   On: 12/02/2022 04:11   CT Cervical Spine Wo Contrast  Result Date: 12/02/2022 CLINICAL DATA:  Fall getting out of bed, on blood thinners. EXAM: CT HEAD WITHOUT CONTRAST CT MAXILLOFACIAL WITHOUT CONTRAST CT CERVICAL SPINE WITHOUT CONTRAST TECHNIQUE: Multidetector CT imaging of the head, cervical spine, and maxillofacial structures were performed using the standard protocol without intravenous contrast. Multiplanar CT image reconstructions of the cervical spine and maxillofacial structures were  also generated. RADIATION DOSE REDUCTION: This exam was performed according to the departmental dose-optimization program which includes automated exposure control, adjustment of the mA and/or kV according to patient size and/or use of iterative reconstruction technique. COMPARISON:  Head CT 10/15/2022 FINDINGS: CT HEAD FINDINGS Brain: No evidence of acute infarction, hemorrhage, hydrocephalus, extra-axial collection or mass lesion/mass effect. Remote left MCA territory infarct with upper division dystrophic calcification. Patchy chronic infarcts also in the left cerebellum. Vascular: No hyperdense vessel or unexpected calcification. Skull: Negative for fracture CT MAXILLOFACIAL FINDINGS Osseous: No fracture or mandibular dislocation. No destructive process. Orbits: No visible injury. Sinuses: Chronic right maxillary sinusitis with complete opacification and atelectasis. Soft tissues: Negative CT CERVICAL SPINE FINDINGS Alignment: Negative Skull base and vertebrae: No acute fracture. No primary bone lesion or focal pathologic process. Soft tissues and spinal canal: No prevertebral fluid or swelling. No visible canal hematoma. Disc levels: Ordinary degenerative changes without visible cord impingement Upper chest: Negative IMPRESSION: 1. No evidence of acute intracranial or cervical spine injury. Negative for facial fracture. 2. Remote left MCA and left cerebellar infarcts. Electronically Signed   By: Jorje Guild M.D.   On: 12/02/2022 04:11   CT Maxillofacial Wo Contrast  Result Date: 12/02/2022 CLINICAL DATA:  Fall getting out of bed, on blood thinners. EXAM: CT HEAD  WITHOUT CONTRAST CT MAXILLOFACIAL WITHOUT CONTRAST CT CERVICAL SPINE WITHOUT CONTRAST TECHNIQUE: Multidetector CT imaging of the head, cervical spine, and maxillofacial structures were performed using the standard protocol without intravenous contrast. Multiplanar CT image reconstructions of the cervical spine and maxillofacial structures were  also generated. RADIATION DOSE REDUCTION: This exam was performed according to the departmental dose-optimization program which includes automated exposure control, adjustment of the mA and/or kV according to patient size and/or use of iterative reconstruction technique. COMPARISON:  Head CT 10/15/2022 FINDINGS: CT HEAD FINDINGS Brain: No evidence of acute infarction, hemorrhage, hydrocephalus, extra-axial collection or mass lesion/mass effect. Remote left MCA territory infarct with upper division dystrophic calcification. Patchy chronic infarcts also in the left cerebellum. Vascular: No hyperdense vessel or unexpected calcification. Skull: Negative for fracture CT MAXILLOFACIAL FINDINGS Osseous: No fracture or mandibular dislocation. No destructive process. Orbits: No visible injury. Sinuses: Chronic right maxillary sinusitis with complete opacification and atelectasis. Soft tissues: Negative CT CERVICAL SPINE FINDINGS Alignment: Negative Skull base and vertebrae: No acute fracture. No primary bone lesion or focal pathologic process. Soft tissues and spinal canal: No prevertebral fluid or swelling. No visible canal hematoma. Disc levels: Ordinary degenerative changes without visible cord impingement Upper chest: Negative IMPRESSION: 1. No evidence of acute intracranial or cervical spine injury. Negative for facial fracture. 2. Remote left MCA and left cerebellar infarcts. Electronically Signed   By: Jorje Guild M.D.   On: 12/02/2022 04:11   DG Pelvis Portable  Result Date: 12/02/2022 CLINICAL DATA:  Possible sepsis, initial encounter EXAM: PORTABLE PELVIS 1 VIEWS COMPARISON:  10/23/2022 FINDINGS: Pelvic ring is intact. No acute fracture or dislocation is noted. No soft tissue abnormality is noted. Degenerative change of the lumbar spine is seen. IMPRESSION: No acute abnormality noted. Electronically Signed   By: Inez Catalina M.D.   On: 12/02/2022 03:34   DG Chest Port 1 View  Result Date:  12/02/2022 CLINICAL DATA:  Possible sepsis EXAM: PORTABLE CHEST 1 VIEW COMPARISON:  11/02/2022 FINDINGS: Cardiac shadow is within normal limits. The lungs are well aerated bilaterally. Fiducial marker is noted in the right mid lung adjacent to a small nodule similar to that seen on the prior exam. Improved aeration is noted in the left suprahilar region when compare with the prior exam. No focal infiltrate is noted. No bony abnormality is seen. IMPRESSION: Stable nodule with fiducial marker in the right mid lung. Improved aeration in the left suprahilar region when compared with the recent exam. Electronically Signed   By: Inez Catalina M.D.   On: 12/02/2022 03:29    Procedures Procedures    Medications Ordered in ED Medications  vancomycin (VANCOREADY) IVPB 1500 mg/300 mL (1,500 mg Intravenous Not Given 12/02/22 0629)  acetaminophen (TYLENOL) tablet 1,000 mg (1,000 mg Oral Given 12/02/22 0408)  lactated ringers bolus 1,000 mL (1,000 mLs Intravenous New Bag/Given 12/02/22 0512)  lactated ringers bolus 1,000 mL (1,000 mLs Intravenous New Bag/Given 12/02/22 0517)  ceFEPIme (MAXIPIME) 2 g in sodium chloride 0.9 % 100 mL IVPB (0 g Intravenous Stopped 12/02/22 0616)  oseltamivir (TAMIFLU) capsule 75 mg (75 mg Oral Given 12/02/22 0643)  iohexol (OMNIPAQUE) 350 MG/ML injection 75 mL (75 mLs Intravenous Contrast Given 12/02/22 0553)    ED Course/ Medical Decision Making/ A&P                           Medical Decision Making Amount and/or Complexity of Data Reviewed Labs: ordered. Radiology: ordered. ECG/medicine tests: ordered.  Risk OTC drugs. Prescription drug management.   Patient with a fall of unclear circumstances.  Full trauma workup initiated without any obvious injuries.  Patient found to be febrile when he got here so code sepsis was activated.  Ultimately did not find any bacterial infection but was influenza positive.  This is likely the source of his sepsis.  He has chronic  changes in his lung that could be infectious versus postradiation.  Just to be on the safe side we will treat him with antibiotics however he is not tachypneic, hypoxic and this seems to be chronic in nature based on the CT scans on my interpretation.  Attempted to call his relative who is listed as next of contact on the chart without a response.  They will need to be contacted prior to transport.  Final Clinical Impression(s) / ED Diagnoses Final diagnoses:  Fall, initial encounter  Influenza    Rx / DC Orders ED Discharge Orders          Ordered    doxycycline (VIBRAMYCIN) 100 MG capsule  2 times daily        12/02/22 0626    oseltamivir (TAMIFLU) 75 MG capsule  Every 12 hours        12/02/22 0626              Charlene Detter, Corene Cornea, MD 12/02/22 4627

## 2022-12-02 NOTE — Sepsis Progress Note (Signed)
Following for sepsis monitoring ?

## 2022-12-02 NOTE — ED Notes (Signed)
Verbal report given to [place] RN at this time

## 2022-12-02 NOTE — ED Notes (Signed)
Pt transported to ct at this time 

## 2022-12-02 NOTE — ED Notes (Signed)
Abrasions noted to bilat hands. Only able to answer yes or no questions, limited movement if any to rt side due to deficits from a previous CVA

## 2022-12-02 NOTE — ED Triage Notes (Signed)
BIB [facility] from home. Fall on thinners eliquis. Slid off bed onto the floor and hit head on night stand. Nose bleed prior to trying to get out of bed. Sudden on set of generalized weakness. PIV 20ga lt hand. Hx of CVA rt hemiplegia, speech deficits. Can only answer yes or no to questions. Todd Estrada is caretaker and pt lives alone.

## 2022-12-02 NOTE — ED Notes (Signed)
Returned from ct at this time ?

## 2022-12-02 NOTE — ED Notes (Signed)
Trauma Event Note   TRN assisted with non activated trauma, 61 yo fall on eliquis with head injury. Also febrile, tachypnic, code sepsis activated.   Todd Estrada [name]  Trauma Response RN  Please call TRN at [phone] for further assistance.

## 2022-12-02 NOTE — ED Notes (Signed)
ED provider at bedside at this time 

## 2022-12-02 NOTE — ED Notes (Signed)
ED provider at bedside.

## 2022-12-02 NOTE — ED Notes (Signed)
Dr. Richardean Chimera made aware of current temp

## 2022-12-02 NOTE — ED Notes (Signed)
Trauma Event Note    To bedside to give tamiflu. Pt reports being soaked in urine, total linen change completed and provided a urinal.   Ashleyann Shoun O Idona Stach  Trauma Response RN  Please call TRN at [phone] for further assistance.

## 2022-12-02 NOTE — ED Notes (Signed)
Called and spoke  family member [name] he is at home and will be there.

## 2022-12-03 ENCOUNTER — Encounter: Payer: Self-pay | Admitting: Pulmonary Disease

## 2022-12-06 NOTE — Progress Notes (Unsigned)
No chief complaint on file.     ASSESSMENT AND PLAN  Todd Estrada is a 61 y.o. 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   MEDICAL HISTORY:  Update 12/07/2022 Todd Estrada: Patient returns for 50-month follow-up.  Overall stable without new stroke/TIA symptoms.  Reports residual ***.  Denies any seizure activity on Keppra 500 mg BID.  Remains on Plavix and atorvastatin.  Blood pressure ***.   Diagnosed with PE 09/2022 secondary to lung cancer and placed on Eliquis.  He has had multiple ER visits due to recurrent falls.      Consult visit 06/02/2022 Dr. Krista Estrada: Todd Estrada, is a 61 year old male, seen in request by PA   Todd Estrada, Todd Circle, PA-C Follow-up for stroke, he is accompanied by his cousin Todd Estrada at today's visit June 02, 2022  I reviewed and summarized the referring note.  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5, echo showed normal resources of cardiac emboli, he was discharged with Lipitor 80 mg, metoprolol and aspirin 81 mg monotherapy, with residual dysarthria, right upper extremity weakness  Hospital admission again on March 02, 2022, with worsening expressive aphasia, right arm and leg weakness, also had a seizure, postevent confusion  He underwent IR thrombectomy by Dr. Estanislado Pandy, please complete revascularization of left terminal carotid artery intracranially, and balloon angioplasty,  Postprocedure MRI of brain showed acute left ACA infarction predominantly affecting the left frontal parietal and temporal lobe, with associated edema, sulcal height placement without midline shift, intra stroke petechiae hemorrhage  Hospital course was also complicated by aspiration pneumonia, recurrent intubation, multiple failed attempts of extubation, eventually leading to tracheostomy, severe dysphagia, required PEG tube placement March 24, evidence of severe peripheral vascular disease  He underwent extensive inpatient rehabilitation, now tracheostomy and PEG tube has been removed, nonhealing wound of bilateral lower extremity, eventually improved  He is now discharged home, severe aphasia, able to dress, feed himself, transfer, take a few steps with walker, Bronchoscopy biopsy of lung nodule confirmed squamous cell carcinoma, consult pending   PHYSICAL EXAM:   There were no vitals filed for this visit.  Not recorded     There is no height or weight on file to calculate BMI.  PHYSICAL EXAMNIATION:  Gen: NAD, conversant, well nourised, well groomed  Cardiovascular: Regular rate rhythm, no peripheral edema, warm, nontender. Eyes: Conjunctivae clear without exudates or hemorrhage Neck: Supple, no carotid  bruits. Pulmonary: Clear to auscultation bilaterally   NEUROLOGICAL EXAM:  MENTAL STATUS: Speech/cognition: Unkempt, sitting in wheelchair, global aphasia CRANIAL NERVES: CN II:  Pupils are round equal and briskly reactive to light.  Right temporal visual field deficit CN III, IV, VI: Right exotropia CN V: Facial sensation is intact to light touch CN VII: Right lower face weakness CN VIII: Hearing is normal to causal conversation. CN IX, X: Phonation is slurred CN XI: Head turning and shoulder shrug are intact  MOTOR: Antigravity movement of proximal right upper, lower extremity, 2 out of 5 strength at right distal arm and leg  REFLEXES: Hyperreflexia of right upper and lower extremity  SENSORY: Right hemisensory loss  COORDINATION: There is no trunk or limb dysmetria noted.  GAIT/STANCE: Deferred  REVIEW OF SYSTEMS:  Full 14 system review of systems performed and notable only for as above All other review of systems were negative.   ALLERGIES: No Known Allergies  HOME MEDICATIONS: Current Outpatient Medications  Medication Sig Dispense Refill   albuterol (PROVENTIL) (2.5 MG/3ML) 0.083% nebulizer solution Take 3 mLs (2.5 mg total) by nebulization every 4 (four) hours as needed for wheezing or shortness of breath. 90 mL 12   apixaban (ELIQUIS) 5 MG TABS tablet Take 1 tablet (5 mg total) by mouth 2 (two) times daily. 60 tablet 2   atorvastatin (LIPITOR) 40 MG tablet Take 1 tablet (40 mg total) by mouth daily. 90 tablet 3   budesonide (PULMICORT) 0.5 MG/2ML nebulizer solution NEW PRESCRIPTION REQEUST: BUDESONIDE 0.5 MG/ 2ML- USE ONE VIAL TWICE DAILY 180 mL 3   clopidogrel (PLAVIX) 75 MG tablet Take 1 tablet (75 mg total) by mouth daily. Okay to restart this medication on 05/31/2022 (Patient taking differently: Take 75 mg by mouth daily.) 90 tablet 3   doxycycline (VIBRAMYCIN) 100 MG capsule Take 1 capsule (100 mg total) by mouth 2 (two) times daily. One po bid x 7 days 14  capsule 0   folic acid (FOLVITE) 1 MG tablet Take 1 tablet (1 mg total) by mouth daily. 30 tablet 2   levETIRAcetam (KEPPRA) 500 MG tablet NEW PRESCRIPTION REQUEST: LEVETIRACETAM 500 MG- TAKE ONE TABLET BY MOUTH TWICE DAILY 180 tablet 3   midodrine (PROAMATINE) 2.5 MG tablet Take 1 tablet (2.5 mg total) by mouth 3 (three) times daily with meals. 90 tablet 5   Multiple Vitamin (MULTIVITAMIN WITH MINERALS) TABS tablet Take 1 tablet by mouth daily. 30 tablet 2   ondansetron (ZOFRAN) 4 MG tablet Take 4 mg by mouth every 8 (eight) hours as needed for nausea or vomiting.     oseltamivir (TAMIFLU) 75 MG capsule Take 1 capsule (75 mg total) by mouth every 12 (twelve) hours. 10 capsule 0   Tiotropium Bromide-Olodaterol (STIOLTO RESPIMAT) 2.5-2.5 MCG/ACT AERS NEW PRESCRIPTION REQUEST: STIOLTO 2.5 MCG- INHALE TWO PUFFS BY MOUTH DAILY 12 g 3   Tiotropium Bromide-Olodaterol (STIOLTO RESPIMAT) 2.5-2.5 MCG/ACT AERS Inhale 2 puffs into the lungs daily. 4 g 0   No current facility-administered medications for this visit.    PAST MEDICAL HISTORY: Past Medical History:  Diagnosis Date   Asthma    COPD (chronic obstructive pulmonary disease) ([place])    Essential hypertension 08/19/2021   GERD (gastroesophageal reflux disease)    History of tracheostomy    03/09/22-04/11/22   HLD (hyperlipidemia)    Hypertension    Lung cancer ([place])  PAD (peripheral artery disease) ([place])    Seizures ([place]) 06/02/2022   Stroke ([place]) 02/2022    PAST SURGICAL HISTORY: Past Surgical History:  Procedure Laterality Date   BRONCHIAL BIOPSY  05/30/2022   Procedure: BRONCHIAL BIOPSIES;  Surgeon: Collene Gobble, MD;  Location: [facility] ENDOSCOPY;  Service: Pulmonary;;   BRONCHIAL BRUSHINGS  05/30/2022   Procedure: BRONCHIAL BRUSHINGS;  Surgeon: Collene Gobble, MD;  Location: [facility] END[facility]COPY;  Service: Pulmonary;;   BRONCHIAL NEEDLE ASPIRATION BIOPSY  05/30/2022   Procedure: BRONCHIAL NEEDLE ASPIRATION BIOPSIES;  Surgeon: Collene Gobble,  MD;  Location: [place];  Service: Pulmonary;;   BRONCHIAL WASHINGS  05/30/2022   Procedure: BRONCHIAL WASHINGS;  Surgeon: Collene Gobble, MD;  Location: [facility] ENDOSCOPY;  Service: Pulmonary;;   ESOPHAGOGASTRODUODENOSCOPY (EGD) WITH PROPOFOL N/A 03/11/2022   Procedure: ESOPHAGOGASTRODUODENOSCOPY (EGD) WITH PROPOFOL;  Surgeon: Georganna Skeans, MD;  Location: [place];  Service: General;  Laterality: N/A;   FIDUCIAL MARKER PLACEMENT  05/30/2022   Procedure: FIDUCIAL MARKER PLACEMENT;  Surgeon: Collene Gobble, MD;  Location: [facility] ENDOSCOPY;  Service: Pulmonary;;   IR ANGIO INTRA EXTRACRAN SEL COM CAROTID INNOMINATE UNI R MOD SED  03/02/2022   IR CT HEAD LTD  03/02/2022   IR PERCUTANEOUS ART THROMBECTOMY/INFUSION INTRACRANIAL INC DIAG ANGIO  03/02/2022   PEG PLACEMENT N/A 03/11/2022   Procedure: PERCUTANEOUS ENDOSCOPIC GASTROSTOMY (PEG) PLACEMENT;  Surgeon: Georganna Skeans, MD;  Location: [place];  Service: General;  Laterality: N/A;   RADIOLOGY WITH ANESTHESIA N/A 03/02/2022   Procedure: IR WITH ANESTHESIA;  Surgeon: Luanne Bras, MD;  Location: [place];  Service: Radiology;  Laterality: N/A;   VIDEO BRONCHOSCOPY WITH RADIAL ENDOBRONCHIAL ULTRASOUND  05/30/2022   Procedure: VIDEO BRONCHOSCOPY WITH RADIAL ENDOBRONCHIAL ULTRASOUND;  Surgeon: Collene Gobble, MD;  Location: [facility] ENDOSCOPY;  Service: Pulmonary;;    FAMILY HISTORY: Family History  Problem Relation Age of Onset   Throat cancer Mother    Liver cancer Father    Kidney failure Sister    Cancer - Lung Paternal Uncle     SOCIAL HISTORY: Social History   Socioeconomic History   Marital status: Widowed    Spouse name: Not on file   Number of children: Not on file   Years of education: Not on file   Highest education level: Not on file  Occupational History   Not on file  Tobacco Use   Smoking status: Former    Types: Cigarettes    Quit date: 05/02/2022    Years since quitting: 0.5    Passive exposure: Never   Smokeless  tobacco: Never  Vaping Use   Vaping Use: Never used  Substance and Sexual Activity   Alcohol use: Not Currently   Drug use: No   Sexual activity: Not on file  Other Topics Concern   Not on file  Social History Narrative   Not on file   Social Determinants of Health   Financial Resource Strain: Not on file  Food Insecurity: No Food Insecurity (10/12/2022)   Hunger Vital Sign    Worried About Running Out of Food in the Last Year: Never true    Ran Out of Food in the Last Year: Never true  Transportation Needs: No Transportation Needs (10/12/2022)   PRAPARE - Hydrologist (Medical): No    Lack of Transportation (Non-Medical): No  Physical Activity: Not on file  Stress: Not on file  Social Connections: Not on file  Intimate Partner Violence: Not At Risk (  10/12/2022)   Humiliation, Afraid, Rape, and Kick questionnaire    Fear of Current or Ex-Partner: No    Emotionally Abused: No    Physically Abused: No    Sexually Abused: No      I spent *** minutes of face-to-face and non-face-to-face time with patient.  This included previsit chart review, lab review, study review, order entry, electronic health record documentation, patient education and discussion regarding above diagnoses and treatment plan and answered all other questions to patient satisfaction  Frann Rider, [facility]  [facility] [address] [place], [place] [address]  Phone [phone] Fax [phone] Note: This document was prepared with digital dictation and possible smart phrase technology. Any transcriptional errors that result from this process are unintentional.

## 2022-12-07 ENCOUNTER — Encounter: Payer: Self-pay | Admitting: Adult Health

## 2022-12-07 ENCOUNTER — Ambulatory Visit (INDEPENDENT_AMBULATORY_CARE_PROVIDER_SITE_OTHER): Payer: Medicaid Other | Admitting: Adult Health

## 2022-12-07 VITALS — BP 104/64 | HR 80 | Ht 72.0 in

## 2022-12-07 DIAGNOSIS — I63512 Cerebral infarction due to unspecified occlusion or stenosis of left middle cerebral artery: Secondary | ICD-10-CM

## 2022-12-07 DIAGNOSIS — R569 Unspecified convulsions: Secondary | ICD-10-CM | POA: Diagnosis not present

## 2022-12-07 LAB — CULTURE, BLOOD (ROUTINE X 2)
Culture: NO GROWTH
Culture: NO GROWTH
Special Requests: ADEQUATE

## 2022-12-07 MED ORDER — LEVETIRACETAM 500 MG PO TABS: 500.0000 mg | ORAL_TABLET | Freq: Two times a day (BID) | ORAL | 3 refills | Status: DC

## 2022-12-07 NOTE — Patient Instructions (Signed)
Continue clopidogrel 75 mg daily  and atorvastatin for secondary stroke prevention  Continue Keppra 500 mg twice daily for seizure prevention  Continue to follow up with PCP regarding blood pressure and cholesterol management  Maintain strict control of hypertension with blood pressure goal below 130/90 and cholesterol with LDL cholesterol (bad cholesterol) goal below 70 mg/dL.   Signs of a Stroke? Follow the BEFAST method:  Balance Watch for a sudden loss of balance, trouble with coordination or vertigo Eyes Is there a sudden loss of vision in one or both eyes? Or double vision?  Face: Ask the person to smile. Does one side of the face droop or is it numb?  Arms: Ask the person to raise both arms. Does one arm drift downward? Is there weakness or numbness of a leg? Speech: Ask the person to repeat a simple phrase. Does the speech sound slurred/strange? Is the person confused ? Time: If you observe any of these signs, call 911.     Followup in the future with me in 8 months or call earlier if needed      Thank you for coming to see [place] at [facility]. I hope we have been able to provide you high quality care today.  You may receive a patient satisfaction survey over the next few weeks. We would appreciate your feedback and comments so that we may continue to improve ourselves and the health of our patients.

## 2022-12-07 NOTE — Telephone Encounter (Signed)
Checked patient's chart. CT was already placed and completed. Will close this encounter.

## 2022-12-08 ENCOUNTER — Other Ambulatory Visit: Payer: Self-pay

## 2022-12-17 ENCOUNTER — Other Ambulatory Visit: Payer: Self-pay | Admitting: Student

## 2022-12-20 ENCOUNTER — Other Ambulatory Visit: Payer: Self-pay

## 2022-12-21 MED ORDER — CLOPIDOGREL BISULFATE 75 MG PO TABS: 75.0000 mg | ORAL_TABLET | Freq: Every day | ORAL | 2 refills | Status: DC

## 2022-12-26 ENCOUNTER — Telehealth: Payer: Self-pay

## 2022-12-26 NOTE — Telephone Encounter (Signed)
(  2:09 pm) PC SW left a message for patient's cousin/[facility] requesting a call back.

## 2023-01-03 ENCOUNTER — Other Ambulatory Visit: Payer: Self-pay

## 2023-01-03 MED ORDER — MIDODRINE HCL 2.5 MG PO TABS: 2.5000 mg | ORAL_TABLET | Freq: Three times a day (TID) | ORAL | 5 refills | Status: DC

## 2023-01-03 MED ORDER — ATORVASTATIN CALCIUM 40 MG PO TABS: 40.0000 mg | ORAL_TABLET | Freq: Every day | ORAL | 3 refills | Status: DC

## 2023-01-06 ENCOUNTER — Encounter: Payer: Self-pay | Admitting: Student

## 2023-01-06 ENCOUNTER — Other Ambulatory Visit: Payer: Self-pay

## 2023-01-06 ENCOUNTER — Ambulatory Visit (INDEPENDENT_AMBULATORY_CARE_PROVIDER_SITE_OTHER): Payer: Medicaid Other | Admitting: Student

## 2023-01-06 ENCOUNTER — Telehealth: Payer: Self-pay

## 2023-01-06 VITALS — BP 99/66 | HR 82 | Temp 98.4°F | Ht 72.0 in | Wt 166.3 lb

## 2023-01-06 DIAGNOSIS — Z87891 Personal history of nicotine dependence: Secondary | ICD-10-CM

## 2023-01-06 DIAGNOSIS — R296 Repeated falls: Secondary | ICD-10-CM

## 2023-01-06 DIAGNOSIS — Z8673 Personal history of transient ischemic attack (TIA), and cerebral infarction without residual deficits: Secondary | ICD-10-CM

## 2023-01-06 DIAGNOSIS — I9589 Other hypotension: Secondary | ICD-10-CM

## 2023-01-06 DIAGNOSIS — I739 Peripheral vascular disease, unspecified: Secondary | ICD-10-CM | POA: Insufficient documentation

## 2023-01-06 DIAGNOSIS — I5022 Chronic systolic (congestive) heart failure: Secondary | ICD-10-CM

## 2023-01-06 DIAGNOSIS — Z86711 Personal history of pulmonary embolism: Secondary | ICD-10-CM | POA: Diagnosis not present

## 2023-01-06 DIAGNOSIS — I959 Hypotension, unspecified: Secondary | ICD-10-CM | POA: Insufficient documentation

## 2023-01-06 MED ORDER — MIDODRINE HCL 2.5 MG PO TABS: 2.5000 mg | ORAL_TABLET | Freq: Three times a day (TID) | ORAL | 5 refills | Status: DC

## 2023-01-06 MED ORDER — APIXABAN 5 MG PO TABS: 5.0000 mg | ORAL_TABLET | Freq: Two times a day (BID) | ORAL | 2 refills | Status: DC

## 2023-01-06 NOTE — Patient Instructions (Signed)
Todd Estrada,  It was a pleasure seeing you in the clinic today.   I have discontinued the clopidogrel given the recent falls. Please only take eliquis as your blood thinner going forward. I have refilled your eliquis and your midodrine. Please make sure to take the midodrine as directed (three times a day with meals) so that we can make sure your blood pressure stays at a good level. Please follow up in 3 months for your next visit.  Please call our clinic at [phone] if you have any questions or concerns. The best time to call is Monday-Friday from 9am-4pm, but there is someone available 24/7 at the same number. If you need medication refills, please notify your pharmacy one week in advance and they will send [place] a request.   Thank you for letting us take part in your care. We look forward to seeing you next time!

## 2023-01-06 NOTE — Telephone Encounter (Signed)
1119 Palliative Care Note  RN attempted to contact [facility] to schedule f/u appt for pt. No answer. LVM with request for call back.  This is attempt #2  Barbette Merino, RN

## 2023-01-06 NOTE — Assessment & Plan Note (Signed)
Patient with chronic hypotension dating back to 02/2022. Unclear etiology. He is on chronic midodrine therapy, but ran out about 2-3 weeks ago. He does not appear volume deplete on exam. Will refill midodrine and have encouraged adequate hydration.  Plan: -refilled midodrine 2.5mg  TID -adequate hydration (~2L/day)

## 2023-01-06 NOTE — Assessment & Plan Note (Signed)
Patient with residual R sided weakness and aphasia following CVA. He has been on plavix and eliquis (for hx of PE), but has been experiencing frequent falls. We agreed to stop plavix today and continue eliquis alone to lessen bleeding risk.  Plan: -stop plavix -continue lipitor

## 2023-01-06 NOTE — Assessment & Plan Note (Signed)
Patient with history of frequent falls with 5 falls noted since October 2023. These falls occur mainly during transfers from bed to bedside commode during the absence of son who is his primary caregiver. Shared decision making was used to discuss current use of eliquis (hx of recurrent PE) and plavix (hx of CVA). We agreed to discontinue plavix and continue eliquis alone at this time to attempt to minimize increased bleeding risk.  Plan: -stop plavix -continue eliquis

## 2023-01-06 NOTE — Assessment & Plan Note (Addendum)
Patient with recent ABIs in 03/2022 revealing severe L PAD and moderate R PAD. On statin therapy.

## 2023-01-06 NOTE — Progress Notes (Signed)
   CC: medication refills  HPI:  Todd Estrada is a 62 y.o. male with history listed below presenting to the [facility] for medication refills. Please see individualized problem based charting for full HPI.  Past Medical History:  Diagnosis Date   Asthma    Atypical chest pain 08/13/2022   Community acquired pneumonia 09/14/2022   COPD (chronic obstructive pulmonary disease) (H[facility])    Essential hypertension 08/19/2021   GERD (gastroesophageal reflux disease)    HAP (hospital-acquired pneumonia) 09/16/2022   History of tracheostomy    03/09/22-04/11/22   HLD (hyperlipidemia)    Hypertension    Hypokalemia 08/13/2022   Hypomagnesemia 08/13/2022   Lung cancer ([facility])    PAD (peripheral artery disease) ([facility])    Seizures ([facility]) 06/02/2022   Sepsis ([facility]) 08/13/2022   Stroke ([facility]) 02/2022    Review of Systems:  Negative aside from that listed in individualized problem based charting.  Physical Exam:  Vitals:   01/06/23 1019  BP: 99/66  Pulse: 82  Temp: 98.4 F (36.9 C)  TempSrc: Oral  SpO2: 98%  Weight: 166 lb 4.8 oz (75.4 kg)  Height: 6' (1.829 m)   Physical Exam Constitutional:      Appearance: He is normal weight.     Comments: Chronically ill-appearing elderly male sitting in wheelchair, NAD.  HENT:     Mouth/Throat:     Mouth: Mucous membranes are moist.     Pharynx: Oropharynx is clear.  Eyes:     General: No scleral icterus.    Extraocular Movements: Extraocular movements intact.     Conjunctiva/sclera: Conjunctivae normal.     Pupils: Pupils are equal, round, and reactive to light.  Cardiovascular:     Rate and Rhythm: Normal rate and regular rhythm.     Heart sounds: Normal heart sounds. No murmur heard.    No gallop.  Pulmonary:     Effort: Pulmonary effort is normal.     Breath sounds: Normal breath sounds. No wheezing, rhonchi or rales.  Abdominal:     General: Bowel sounds are normal. There is no distension.     Palpations: Abdomen is soft.      Tenderness: There is no abdominal tenderness.  Musculoskeletal:        General: No swelling. Normal range of motion.  Skin:    General: Skin is warm and dry.  Neurological:     Mental Status: He is alert. Mental status is at baseline.     Comments: Aphasic, chronic. R sided weakness, chronic.  Psychiatric:        Mood and Affect: Mood normal.        Behavior: Behavior normal.      Assessment & Plan:   See Encounters Tab for problem based charting.  Patient discussed with Dr.  Mayford Knife

## 2023-01-13 ENCOUNTER — Telehealth: Payer: Self-pay

## 2023-01-13 NOTE — Telephone Encounter (Signed)
[place] Note  RN attempted to contact pt to schedule f/u visit. No answer. LVM with contact info and request to return call.  This is 3rd failed attempt to contact pt.  Jacqulyn Cane, RN

## 2023-01-18 ENCOUNTER — Emergency Department (HOSPITAL_COMMUNITY): Payer: Medicaid Other

## 2023-01-18 ENCOUNTER — Emergency Department (HOSPITAL_COMMUNITY)
Admission: EM | Admit: 2023-01-18 | Discharge: 2023-01-18 | Disposition: A | Discharge status: Home / Self Care | Payer: Medicaid Other | Attending: Emergency Medicine | Admitting: Emergency Medicine

## 2023-01-18 DIAGNOSIS — I2699 Other pulmonary embolism without acute cor pulmonale: Secondary | ICD-10-CM

## 2023-01-18 DIAGNOSIS — Z1152 Encounter for screening for COVID-19: Secondary | ICD-10-CM | POA: Insufficient documentation

## 2023-01-18 DIAGNOSIS — U071 COVID-19: Secondary | ICD-10-CM | POA: Diagnosis not present

## 2023-01-18 DIAGNOSIS — J449 Chronic obstructive pulmonary disease, unspecified: Secondary | ICD-10-CM | POA: Diagnosis not present

## 2023-01-18 DIAGNOSIS — C3490 Malignant neoplasm of unspecified part of unspecified bronchus or lung: Secondary | ICD-10-CM | POA: Diagnosis not present

## 2023-01-18 DIAGNOSIS — I10 Essential (primary) hypertension: Secondary | ICD-10-CM | POA: Insufficient documentation

## 2023-01-18 DIAGNOSIS — Z8673 Personal history of transient ischemic attack (TIA), and cerebral infarction without residual deficits: Secondary | ICD-10-CM | POA: Insufficient documentation

## 2023-01-18 DIAGNOSIS — Z7901 Long term (current) use of anticoagulants: Secondary | ICD-10-CM | POA: Diagnosis not present

## 2023-01-18 DIAGNOSIS — R079 Chest pain, unspecified: Secondary | ICD-10-CM | POA: Diagnosis present

## 2023-01-18 LAB — CBC WITH DIFFERENTIAL/PLATELET
Abs Immature Granulocytes: 0.01 10*3/uL (ref 0.00–0.07)
Basophils Absolute: 0 10*3/uL (ref 0.0–0.1)
Basophils Relative: 0 %
Eosinophils Absolute: 0 10*3/uL (ref 0.0–0.5)
Eosinophils Relative: 0 %
HCT: 37.5 % — ABNORMAL LOW (ref 39.0–52.0)
Hemoglobin: 11.9 g/dL — ABNORMAL LOW (ref 13.0–17.0)
Immature Granulocytes: 0 %
Lymphocytes Relative: 13 %
Lymphs Abs: 0.5 10*3/uL — ABNORMAL LOW (ref 0.7–4.0)
MCH: 30.1 pg (ref 26.0–34.0)
MCHC: 31.7 g/dL (ref 30.0–36.0)
MCV: 94.7 fL (ref 80.0–100.0)
Monocytes Absolute: 0.7 10*3/uL (ref 0.1–1.0)
Monocytes Relative: 17 %
Neutro Abs: 2.8 10*3/uL (ref 1.7–7.7)
Neutrophils Relative %: 70 %
Platelets: 102 10*3/uL — ABNORMAL LOW (ref 150–400)
RBC: 3.96 MIL/uL — ABNORMAL LOW (ref 4.22–5.81)
RDW: 14.1 % (ref 11.5–15.5)
WBC: 4 10*3/uL (ref 4.0–10.5)
nRBC: 0 % (ref 0.0–0.2)

## 2023-01-18 LAB — COMPREHENSIVE METABOLIC PANEL
ALT: 16 U/L (ref 0–44)
AST: 21 U/L (ref 15–41)
Albumin: 3.7 g/dL (ref 3.5–5.0)
Alkaline Phosphatase: 68 U/L (ref 38–126)
Anion gap: 10 (ref 5–15)
BUN: 14 mg/dL (ref 8–23)
CO2: 26 mmol/L (ref 22–32)
Calcium: 8.8 mg/dL — ABNORMAL LOW (ref 8.9–10.3)
Chloride: 101 mmol/L (ref 98–111)
Creatinine, Ser: 0.79 mg/dL (ref 0.61–1.24)
GFR, Estimated: >60 mL/min (ref >60)
Glucose, Bld: 96 mg/dL (ref 70–99)
Potassium: 4.1 mmol/L (ref 3.5–5.1)
Sodium: 137 mmol/L (ref 135–145)
Total Bilirubin: 0.7 mg/dL (ref 0.3–1.2)
Total Protein: 7.4 g/dL (ref 6.5–8.1)

## 2023-01-18 LAB — RESP PANEL BY RT-PCR (RSV, FLU A&B, COVID)  RVPGX2
Influenza A by PCR: NEGATIVE
Influenza B by PCR: NEGATIVE
Resp Syncytial Virus by PCR: NEGATIVE
SARS Coronavirus 2 by RT PCR: POSITIVE — AB

## 2023-01-18 LAB — LIPASE, BLOOD: Lipase: 34 U/L (ref 11–51)

## 2023-01-18 LAB — D-DIMER, QUANTITATIVE: D-Dimer, Quant: 2.26 ug/mL-FEU — ABNORMAL HIGH (ref 0.00–0.50)

## 2023-01-18 LAB — TROPONIN I (HIGH SENSITIVITY)
Troponin I (High Sensitivity): 4 ng/L (ref <18)
Troponin I (High Sensitivity): 4 ng/L (ref <18)

## 2023-01-18 MED ORDER — IOHEXOL 350 MG/ML SOLN
80.0000 mL | Freq: Once | INTRAVENOUS | Status: AC | PRN
  Administered 2023-01-18: 80 mL via INTRAVENOUS

## 2023-01-18 MED ORDER — ACETAMINOPHEN 325 MG PO TABS
650.0000 mg | ORAL_TABLET | Freq: Once | ORAL | Status: AC
  Administered 2023-01-18: 650 mg via ORAL
  Filled 2023-01-18: qty 2

## 2023-01-18 NOTE — ED Notes (Signed)
Called caregiver/cousin [name] and he is coming to pick up the patient.  Went over discharge instructions with him over the phone and all questions answered.

## 2023-01-18 NOTE — ED Triage Notes (Signed)
Pt arrives with cousin who is caretaker and states that pt woke up this morning and told family that he was having CP. Pt had CVA last year and is mostly nonverbal since. Pt also has hx of Stage IV lung cancer. Pt not on medications for the last week as they've had difficulty with pharmacy and insurance. Pt usually on midodrine and Eliquis among others per pt.

## 2023-01-18 NOTE — Consult Note (Signed)
Consult Note   Todd Estrada VEL:[id number] DOB: 04/06/1961 DOA: 01/18/2023  PCP: [name], MD  Patient coming from: Home  I have personally briefly reviewed patient's old medical records in [place]  Chief Complaint: Chest discomfort  HPI: Todd Estrada is a 62 y.o. 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  History is somewhat limited from patient due to dysarthria/aphasia.  Patient with chronic speech difficulty due to stroke last year.  Is able to clearly answer yes/no questions but has difficulty communicating further.  He is following commands appropriately.  He states that he lives with family.  He indicated that he developed chest discomfort this morning.  He has not had any shortness of breath, nausea, vomiting, fevers, chills, diaphoresis.  He has been on Eliquis due to history of prior PE but from history obtained by the emergency physician from family/caregiver he has been out of his Eliquis and midodrine for 1 week.  ED Course  Labs/Imaging on admission: I have personally reviewed following labs and imaging studies.  Initial vitals showed BP 112/75, pulse 81, RR 19, temp 99.4 F, SpO2 97% on room air.  Labs showed WBC 4.0, hemoglobin 11.9, platelets 102,000, sodium 137, potassium 4.1, bicarb 26, BUN 14, creatinine 0.79, serum glucose 96, LFTs within normal limits, lipase 24, troponin 4 x 2, D-dimer 2.26.  SARS-CoV-2 PCR is positive.  Influenza and RSV negative.  CTA chest showed left-sided pulmonary emboli, stable emphysematous changes and pulmonary scarring, no change in bilateral pulmonary lesions, stable mediastinal and hilar lymph nodes.  The hospitalist service was consulted for  medical input.  Review of Systems: All systems reviewed and are negative except as documented in history of present illness above.   Past Medical History:  Diagnosis Date   Asthma    Atypical chest pain 08/13/2022   Community acquired pneumonia 09/14/2022   COPD (chronic obstructive pulmonary disease) ([place])    Essential hypertension 08/19/2021   GERD (gastroesophageal reflux disease)    HAP (hospital-acquired pneumonia) 09/16/2022   History of tracheostomy    03/09/22-04/11/22   HLD (hyperlipidemia)    Hypertension    Hypokalemia 08/13/2022   Hypomagnesemia 08/13/2022   Lung cancer ([place])    PAD (peripheral artery disease) ([place])    Seizures ([place]) 06/02/2022   Sepsis ([place]) 08/13/2022   Stroke ([place]) 02/2022    Past Surgical History:  Procedure Laterality Date   BRONCHIAL BIOPSY  05/30/2022   Procedure: BRONCHIAL BIOPSIES;  Surgeon: Collene Gobble, MD;  Location: [facility];  Service: Pulmonary;;   BRONCHIAL BRUSHINGS  05/30/2022   Procedure: BRONCHIAL BRUSHINGS;  Surgeon: Collene Gobble, MD;  Location: [facility] ENDOSCOPY;  Service: Pulmonary;;   BRONCHIAL NEEDLE ASPIRATION BIOPSY  05/30/2022   Procedure: BRONCHIAL NEEDLE ASPIRATION BIOPSIES;  Surgeon: Collene Gobble, MD;  Location[facility] ENDOSCOPY;  Service: Pulmonary;;   BRONCHIAL WASHINGS  05/30/2022   Procedure: BRONCHIAL WASHINGS;  Surgeon: Collene Gobble, MD;  Location: [facility] ENDOSCOPY;  Service: Pulmonary;;   ESOPHAGOGASTRODUODENOSCOPY (EGD) WITH PROPOFOL N/A 03/11/2022   Procedure: ESOPHAGOGASTRODUODENOSCOPY (EGD) WITH PROPOFOL;  Surgeon: Georganna Skeans, MD;  Location: [facility] ENDOSCOPY;  Service: General;  Laterality: N/A;   FIDUCIAL MARKER PLACEMENT  05/30/2022   Procedure: FIDUCIAL MARKER PLACEMENT;  Surgeon: Collene Gobble,  MD;  Location: [facility];  Service: Pulmonary;;   IR ANGIO INTRA EXTRACRAN SEL COM CAROTID INNOMINATE UNI R MOD SED  03/02/2022   IR CT HEAD LTD  03/02/2022   IR PERCUTANEOUS ART THROMBECTOMY/INFUSION  INTRACRANIAL INC DIAG ANGIO  03/02/2022   PEG PLACEMENT N/A 03/11/2022   Procedure: PERCUTANEOUS ENDOSCOPIC GASTROSTOMY (PEG) PLACEMENT;  Surgeon: Georganna Skeans, MD;  Location: [facility];  Service: General;  Laterality: N/A;   RADIOLOGY WITH ANESTHESIA N/A 03/02/2022   Procedure: IR WITH ANESTHESIA;  Surgeon: Luanne Bras, MD;  Location: [place];  Service: Radiology;  Laterality: N/A;   VIDEO BRONCHOSCOPY WITH RADIAL ENDOBRONCHIAL ULTRASOUND  05/30/2022   Procedure: VIDEO BRONCHOSCOPY WITH RADIAL ENDOBRONCHIAL ULTRASOUND;  Surgeon: Collene Gobble, MD;  Location: [facility] ENDOSCOPY;  Service: Pulmonary;;    Social History:  reports that he quit smoking about 8 months ago. His smoking use included cigarettes. He has never been exposed to tobacco smoke. He has never used smokeless tobacco. He reports that he does not currently use alcohol. He reports that he does not use drugs.  No Known Allergies  Family History  Problem Relation Age of Onset   Throat cancer Mother    Liver cancer Father    Kidney failure Sister    Cancer - Lung Paternal Uncle      Prior to Admission medications   Medication Sig Start Date End Date Taking? Authorizing Provider  albuterol (PROVENTIL) (2.5 MG/3ML) 0.083% nebulizer solution Take 3 mLs (2.5 mg total) by nebulization every 4 (four) hours as needed for wheezing or shortness of breath. 08/16/22   Lacinda Axon, MD  apixaban (ELIQUIS) 5 MG TABS tablet Take 1 tablet (5 mg total) by mouth 2 (two) times daily. 01/06/23   [name], MD  atorvastatin (LIPITOR) 40 MG tablet Take 1 tablet (40 mg total) by mouth daily. 01/03/23 01/03/24  [name], MD  budesonide (PULMICORT) 0.5 MG/2ML nebulizer solution NEW PRESCRIPTION REQEUST: BUDESONIDE 0.5 MG/ 2ML- USE ONE VIAL TWICE DAILY 11/29/22   Freddi Starr, MD  folic acid (FOLVITE) 1 MG tablet Take 1 tablet (1 mg total) by mouth daily. 11/30/22   [name], MD  levETIRAcetam (KEPPRA) 500 MG tablet Take  1 tablet (500 mg total) by mouth 2 (two) times daily. 12/07/22   Frann Rider, NP  midodrine (PROAMATINE) 2.5 MG tablet Take 1 tablet (2.5 mg total) by mouth 3 (three) times daily with meals. 01/06/23 07/05/23  [name], MD  Multiple Vitamin (MULTIVITAMIN WITH MINERALS) TABS tablet Take 1 tablet by mouth daily. 09/20/22   Gaylan Gerold, DO  ondansetron (ZOFRAN) 4 MG tablet Take 4 mg by mouth every 8 (eight) hours as needed for nausea or vomiting.    [provider]  Tiotropium Bromide-Olodaterol (STIOLTO RESPIMAT) 2.5-2.5 MCG/ACT AERS NEW PRESCRIPTION REQUEST: STIOLTO 2.5 MCG- INHALE TWO PUFFS BY MOUTH DAILY 11/29/22   Freddi Starr, MD  Tiotropium Bromide-Olodaterol (STIOLTO RESPIMAT) 2.5-2.5 MCG/ACT AERS Inhale 2 puffs into the lungs daily. 11/29/22   Freddi Starr, MD    Physical Exam: Vitals:   01/18/23 1804 01/18/23 1845 01/18/23 1851 01/18/23 1852  BP: 100/68   104/64  Pulse: 77 75 71 73  Resp: 20 19  20   Temp:   98.6 F (37 C) 98.8 F (37.1 C)  TempSrc:   Oral Oral  SpO2: 99% 98%  97%   Constitutional: Chronically ill-appearing man resting in bed.  NAD, calm, comfortable Eyes: EOMI, lids and conjunctivae normal ENMT: Mucous membranes are  dry. Posterior pharynx clear of any exudate or lesions.Normal dentition.  Neck: normal, supple, no masses. Respiratory: clear to auscultation bilaterally, no wheezing, no crackles. Normal respiratory effort. No accessory muscle use.  Cardiovascular: Regular rate and rhythm, no murmurs / rubs / gallops. No extremity edema. 2+ pedal pulses. Abdomen: no tenderness, no masses palpated. Musculoskeletal: no clubbing / cyanosis. No joint deformity upper and lower extremities.  Skin: no rashes, lesions, ulcers. No induration Neurologic: Significant dysarthria.  Sensation intact. Strength 5/5 in all LUE and LLE, 3/5 RUE and RLE. Psychiatric: Awake, alert, oriented.  Following commands appropriately.  EKG: Personally reviewed.  Sinus rhythm, rate 82, no acute ischemic changes.  Tachycardia and PVCs no longer present when compared to prior.  Assessment/Plan Active Problems:   Acute pulmonary embolism (HCC)   Acute left-sided pulmonary emboli: Seen on CTA chest.  He is not hypoxic, tachycardic, or dyspneic.  Per previous history from family, has been off of Eliquis for 1 week.  Given this time off anticoagulation I would not consider this treatment failure and would recommend he resume Eliquis as prescribed.  No indication for admission, would recommend he follow-up with his PCP at the [facility] internal medicine clinic next week or sooner if needed.  Discussed with ED provider, Dr. Alvino Chapel.  COVID-19 viral infection: SARS-CoV-2 PCR is positive.  Appears to be largely asymptomatic from this standpoint.  CT imaging shows stable emphysematous changes and pulmonary scarring.  No secondary pneumonia noted.  Does not require any specific treatment against COVID-19 at this time.  Would avoid Paxlovid at this time due to drug interaction with Eliquis.   Zada Finders MD [facility]  If 7PM-7AM, please contact night-coverage [email or web address]  01/18/2023, 7:12 PM

## 2023-01-18 NOTE — ED Provider Notes (Signed)
[place] AT [facility] Provider Note   CSN: [id number] Arrival date & time: 01/18/23  1052     History  Chief Complaint  Patient presents with   Chest Pain    BRNADON Estrada is a 62 y.o. male.  HPI 62 year old male presents with chest pain.  The patient has significant speech difficulty due to his history of stroke last year.  He is also dealing with stage IV lung cancer.  Thus I am only able to get essentially yes/no questions from the patient.  He does indicate he is having chest pain that started this morning.  He denies dyspnea, abdominal pain, back pain.  No leg swelling.  I did eventually talk to the cousin/caregiver, Livia Snellen, who notes that the patient has been out of his midodrine and eliquis for 1 week. Should be able to get it today. Otherwise developed dyspnea/chest pain today. No cough.  Home Medications Prior to Admission medications   Medication Sig Start Date End Date Taking? Authorizing Provider  albuterol (PROVENTIL) (2.5 MG/3ML) 0.083% nebulizer solution Take 3 mLs (2.5 mg total) by nebulization every 4 (four) hours as needed for wheezing or shortness of breath. 08/16/22   Lacinda Axon, MD  apixaban (ELIQUIS) 5 MG TABS tablet Take 1 tablet (5 mg total) by mouth 2 (two) times daily. 01/06/23   [name], MD  atorvastatin (LIPITOR) 40 MG tablet Take 1 tablet (40 mg total) by mouth daily. 01/03/23 01/03/24  [name], MD  budesonide (PULMICORT) 0.5 MG/2ML nebulizer solution NEW PRESCRIPTION REQEUST: BUDESONIDE 0.5 MG/ 2ML- USE ONE VIAL TWICE DAILY 11/29/22   Freddi Starr, MD  folic acid (FOLVITE) 1 MG tablet Take 1 tablet (1 mg total) by mouth daily. 11/30/22   [name], MD  levETIRAcetam (KEPPRA) 500 MG tablet Take 1 tablet (500 mg total) by mouth 2 (two) times daily. 12/07/22   Frann Rider, NP  midodrine (PROAMATINE) 2.5 MG tablet Take 1 tablet (2.5 mg total) by mouth 3 (three) times daily with meals.  01/06/23 07/05/23  [name], MD  Multiple Vitamin (MULTIVITAMIN WITH MINERALS) TABS tablet Take 1 tablet by mouth daily. 09/20/22   Gaylan Gerold, DO  ondansetron (ZOFRAN) 4 MG tablet Take 4 mg by mouth every 8 (eight) hours as needed for nausea or vomiting.    [provider]  Tiotropium Bromide-Olodaterol (STIOLTO RESPIMAT) 2.5-2.5 MCG/ACT AERS NEW PRESCRIPTION REQUEST: STIOLTO 2.5 MCG- INHALE TWO PUFFS BY MOUTH DAILY 11/29/22   Freddi Starr, MD  Tiotropium Bromide-Olodaterol (STIOLTO RESPIMAT) 2.5-2.5 MCG/ACT AERS Inhale 2 puffs into the lungs daily. 11/29/22   Freddi Starr, MD      Allergies    Patient has no known allergies.    Review of Systems   Review of Systems  Unable to perform ROS: Patient nonverbal    Physical Exam Updated Vital Signs BP 118/82   Pulse 77   Temp 98.9 F (37.2 C) (Oral)   Resp 15   SpO2 100%  Physical Exam Vitals and nursing note reviewed.  Constitutional:      General: He is not in acute distress.    Appearance: He is well-developed. He is not ill-appearing or diaphoretic.  HENT:     Head: Normocephalic and atraumatic.  Cardiovascular:     Rate and Rhythm: Normal rate and regular rhythm.     Heart sounds: Normal heart sounds.  Pulmonary:     Effort: Pulmonary effort is normal.  Breath sounds: Normal breath sounds.  Chest:     Chest wall: No tenderness.  Abdominal:     Palpations: Abdomen is soft.     Tenderness: There is no abdominal tenderness.  Musculoskeletal:     Right lower leg: No edema.     Left lower leg: No edema.  Skin:    General: Skin is warm and dry.  Neurological:     Mental Status: He is alert.     ED Results / Procedures / Treatments   Labs (all labs ordered are listed, but only abnormal results are displayed) Labs Reviewed  RESP PANEL BY RT-PCR (RSV, FLU A&B, COVID)  RVPGX2 - Abnormal; Notable for the following components:      Result Value   SARS Coronavirus 2 by RT PCR POSITIVE (*)     All other components within normal limits  COMPREHENSIVE METABOLIC PANEL - Abnormal; Notable for the following components:   Calcium 8.8 (*)    All other components within normal limits  CBC WITH DIFFERENTIAL/PLATELET - Abnormal; Notable for the following components:   RBC 3.96 (*)    Hemoglobin 11.9 (*)    HCT 37.5 (*)    Platelets 102 (*)    Lymphs Abs 0.5 (*)    All other components within normal limits  D-DIMER, QUANTITATIVE - Abnormal; Notable for the following components:   D-Dimer, Quant 2.26 (*)    All other components within normal limits  LIPASE, BLOOD  TROPONIN I (HIGH SENSITIVITY)  TROPONIN I (HIGH SENSITIVITY)    EKG EKG Interpretation  Date/Time:  Wednesday January 18 2023 11:01:20 EST Ventricular Rate:  82 PR Interval:  114 QRS Duration: 92 QT Interval:  347 QTC Calculation: 406 R Axis:   90 Text Interpretation: Sinus rhythm Borderline short PR interval Borderline right axis deviation no acute ST/T changes rate is slower compared to Oct 2023 Confirmed by Sherwood Gambler [phone]) on 01/18/2023 11:05:02 AM  Radiology DG Chest 2 View  Result Date: 01/18/2023 CLINICAL DATA:  Chest pain. EXAM: CHEST - 2 VIEW COMPARISON:  12/02/2022 and CT chest 12/02/2022. FINDINGS: Trachea is midline. Heart size normal. Fiducial marker is seen in association with a nodular density in posterior segment right upper lobe. Additional linear opacity and architectural distortion in the left perihilar region. No pleural fluid. IMPRESSION: 1. Post treatment changes in the midlung zones bilaterally, better evaluated on CT chest 12/02/2022. 2. No definite superimposed acute findings. Electronically Signed   By: Lorin Picket M.D.   On: 01/18/2023 11:37    Procedures Procedures    Medications Ordered in ED Medications  acetaminophen (TYLENOL) tablet 650 mg (650 mg Oral Given 01/18/23 1459)    ED Course/ Medical Decision Making/ A&P                             Medical Decision  Making Amount and/or Complexity of Data Reviewed Labs: ordered.    Details: Troponin negative x 2.  Normal white blood cell count.  COVID test is positive.  Elevated D-dimer. Radiology: ordered and independent interpretation performed.    Details: No pneumonia ECG/medicine tests: ordered and independent interpretation performed.    Details: No acute ischemia.  Risk OTC drugs.   Ultimately I was able to finally talk to the cousin/caretaker.  Seems like symptoms all started this morning.  Mostly chest pain and shortness of breath.  He does have a history of PE and has missed his Eliquis for over  a week.  Probably this is all from [place] but I think he deserves a CTA after his positive D-dimer as well (may just be from [place]).  CT currently pending. Care transferred to Dr. Alvino Chapel.        Final Clinical Impression(s) / ED Diagnoses Final diagnoses:  None    Rx / DC Orders ED Discharge Orders     None         Sherwood Gambler, MD 01/18/23 1538

## 2023-01-18 NOTE — Progress Notes (Signed)
Internal Medicine Clinic Attending  Case discussed with Dr. Jinwala  At the time of the visit.  We reviewed the resident's history and exam and pertinent patient test results.  I agree with the assessment, diagnosis, and plan of care documented in the resident's note.  

## 2023-01-18 NOTE — Discharge Instructions (Addendum)
Take your blood thinner.  You also have COVID.  Watch for worsening difficulty breathing.  Follow-up with your doctors.

## 2023-01-18 NOTE — ED Provider Notes (Addendum)
  Physical Exam  BP 101/72   Pulse 71   Temp 98.9 F (37.2 C) (Oral)   Resp 19   SpO2 97%   Physical Exam  Procedures  Procedures  ED Course / MDM    Medical Decision Making Amount and/or Complexity of Data Reviewed Labs: ordered. Radiology: ordered.  Risk OTC drugs. Prescription drug management. Decision regarding hospitalization.   Received patient in signout.  Shortness of breath.  Cough.  Found to have [place].  Questionable compliance with his anticoagulation however found to have pulmonary embolism.  I think with shortness of breath and COVID potentially having been on anticoagulation would benefit from admission to the hospital.  Will discuss with hospitalist.  Discussed with Dr. Posey Pronto who is coming seen patient.  Thinks patient does not require admission in the hospital.  Not hypoxic.  Not dyspneic.  Did not think that patient needed Paxlovid.  Sounds like patient has not been compliant with his medications.  Will discharge home with outpatient follow-up.       Davonna Belling, MD 01/18/23 Mallie Mussel    Davonna Belling, MD 01/18/23 [phone]

## 2023-01-20 ENCOUNTER — Telehealth: Payer: Self-pay

## 2023-01-20 NOTE — Telephone Encounter (Signed)
(  4:25 pm) PC SW 3rd attempt to contact patient or his cousin to no avail. Patient removed from active list.

## 2023-01-29 ENCOUNTER — Emergency Department (HOSPITAL_COMMUNITY)
Admission: EM | Admit: 2023-01-29 | Discharge: 2023-01-29 | Disposition: A | Discharge status: Home / Self Care | Payer: Medicaid Other | Attending: Emergency Medicine | Admitting: Emergency Medicine

## 2023-01-29 ENCOUNTER — Emergency Department (HOSPITAL_COMMUNITY): Payer: Medicaid Other

## 2023-01-29 ENCOUNTER — Other Ambulatory Visit: Payer: Self-pay

## 2023-01-29 ENCOUNTER — Encounter (HOSPITAL_COMMUNITY): Payer: Self-pay

## 2023-01-29 DIAGNOSIS — I509 Heart failure, unspecified: Secondary | ICD-10-CM | POA: Diagnosis not present

## 2023-01-29 DIAGNOSIS — R0789 Other chest pain: Secondary | ICD-10-CM

## 2023-01-29 DIAGNOSIS — Z85118 Personal history of other malignant neoplasm of bronchus and lung: Secondary | ICD-10-CM | POA: Diagnosis not present

## 2023-01-29 DIAGNOSIS — S4991XA Unspecified injury of right shoulder and upper arm, initial encounter: Secondary | ICD-10-CM | POA: Insufficient documentation

## 2023-01-29 DIAGNOSIS — W19XXXA Unspecified fall, initial encounter: Secondary | ICD-10-CM | POA: Insufficient documentation

## 2023-01-29 DIAGNOSIS — Z7901 Long term (current) use of anticoagulants: Secondary | ICD-10-CM | POA: Diagnosis not present

## 2023-01-29 DIAGNOSIS — S20301A Unspecified superficial injuries of right front wall of thorax, initial encounter: Secondary | ICD-10-CM | POA: Insufficient documentation

## 2023-01-29 DIAGNOSIS — M79601 Pain in right arm: Secondary | ICD-10-CM

## 2023-01-29 NOTE — ED Provider Notes (Incomplete)
[place] EMERGENCY DEPARTMENT AT [facility] Provider Note   CSN: [id number] Arrival date & time: 01/29/23  1529     History {Add pertinent medical, surgical, social history, OB history to HPI:1} Chief Complaint  Patient presents with   Arm Injury   Rib Injury    Todd Estrada is a 62 y.o. male.   Arm Injury      Home Medications Prior to Admission medications   Medication Sig Start Date End Date Taking? Authorizing Provider  albuterol (PROVENTIL) (2.5 MG/3ML) 0.083% nebulizer solution Take 3 mLs (2.5 mg total) by nebulization every 4 (four) hours as needed for wheezing or shortness of breath. 08/16/22   Lacinda Axon, MD  apixaban (ELIQUIS) 5 MG TABS tablet Take 1 tablet (5 mg total) by mouth 2 (two) times daily. 01/06/23   [name], MD  atorvastatin (LIPITOR) 40 MG tablet Take 1 tablet (40 mg total) by mouth daily. 01/03/23 01/03/24  [name], MD  budesonide (PULMICORT) 0.5 MG/2ML nebulizer solution NEW PRESCRIPTION REQEUST: BUDESONIDE 0.5 MG/ 2ML- USE ONE VIAL TWICE DAILY Patient taking differently: Take 0.5 mg by nebulization in the morning and at bedtime. 11/29/22   Freddi Starr, MD  folic acid (FOLVITE) 1 MG tablet Take 1 tablet (1 mg total) by mouth daily. 11/30/22   [name], MD  levETIRAcetam (KEPPRA) 500 MG tablet Take 1 tablet (500 mg total) by mouth 2 (two) times daily. 12/07/22   Frann Rider, NP  midodrine (PROAMATINE) 2.5 MG tablet Take 1 tablet (2.5 mg total) by mouth 3 (three) times daily with meals. 01/06/23 07/05/23  [name], MD  Multiple Vitamin (MULTIVITAMIN WITH MINERALS) TABS tablet Take 1 tablet by mouth daily. Patient taking differently: Take 1 tablet by mouth daily with breakfast. 09/20/22   Gaylan Gerold, DO  ondansetron (ZOFRAN) 4 MG tablet Take 4 mg by mouth every 8 (eight) hours as needed for nausea or vomiting.    [provider]  Tiotropium Bromide-Olodaterol (STIOLTO RESPIMAT) 2.5-2.5 MCG/ACT  AERS NEW PRESCRIPTION REQUEST: STIOLTO 2.5 MCG- INHALE TWO PUFFS BY MOUTH DAILY Patient taking differently: Inhale 2 each into the lungs daily. 11/29/22   Freddi Starr, MD  Tiotropium Bromide-Olodaterol (STIOLTO RESPIMAT) 2.5-2.5 MCG/ACT AERS Inhale 2 puffs into the lungs daily. Patient not taking: Reported on 01/18/2023 11/29/22   Freddi Starr, MD      Allergies    Patient has no known allergies.    Review of Systems   Review of Systems  Physical Exam Updated Vital Signs BP 100/75 (BP Location: Left Arm)   Pulse 90   Temp 98.4 F (36.9 C) (Oral)   Resp 18   Ht 6' (1.829 m)   Wt 75 kg   SpO2 97%   BMI 22.42 kg/m  Physical Exam  ED Results / Procedures / Treatments   Labs (all labs ordered are listed, but only abnormal results are displayed) Labs Reviewed - No data to display  EKG None  Radiology No results found.  Procedures Procedures  {Document cardiac monitor, telemetry assessment procedure when appropriate:1}  Medications Ordered in ED Medications - No data to display  ED Course/ Medical Decision Making/ A&P   {   Click here for ABCD2, HEART and other calculatorsREFRESH Note before signing :1}                          Medical Decision Making  ***  {Document critical care time when appropriate:1} {  Document review of labs and clinical decision tools ie heart score, Chads2Vasc2 etc:1}  {Document your independent review of radiology images, and any outside records:1} {Document your discussion with family members, caretakers, and with consultants:1} {Document social determinants of health affecting pt's care:1} {Document your decision making why or why not admission, treatments were needed:1} Final Clinical Impression(s) / ED Diagnoses Final diagnoses:  None    Rx / DC Orders ED Discharge Orders     None

## 2023-01-29 NOTE — Discharge Instructions (Signed)
You were seen in the ER for evaluation. Your imaging did not show any signs of any fractures. Please follow up with your PCP. If you have any concerns, new or worsening symptoms, please return to the nearest  ER for re-evaluation.   Contact a doctor if: You have a fever. Your chest pain gets worse. You have new symptoms. Get help right away if: You feel sick to your stomach (nauseous) or you throw up (vomit). You feel sweaty or light-headed. You have a cough with mucus from your lungs (sputum) or you cough up blood. You are short of breath. These symptoms may be an emergency. Do not wait to see if the symptoms will go away. Get medical help right away. Call your local emergency services (911 in the [place] Do not drive yourself to the hospital.

## 2023-01-29 NOTE — ED Triage Notes (Signed)
[place] off the toilet today and is complaining of right arm pain and rib pain, denies any LOC, or hitting his head.

## 2023-02-01 ENCOUNTER — Telehealth: Payer: Self-pay | Admitting: *Deleted

## 2023-02-01 NOTE — Transitions of Care (Post Inpatient/ED Visit) (Unsigned)
   02/01/2023  Name: Todd Estrada MRN: [id number] DOB: 11-Oct-1961  {[id number]}

## 2023-02-08 ENCOUNTER — Emergency Department (HOSPITAL_COMMUNITY)
Admission: EM | Admit: 2023-02-08 | Discharge: 2023-02-08 | Disposition: A | Discharge status: Home / Self Care | Payer: Medicaid Other | Attending: Emergency Medicine | Admitting: Emergency Medicine

## 2023-02-08 ENCOUNTER — Emergency Department (HOSPITAL_COMMUNITY): Payer: Medicaid Other

## 2023-02-08 ENCOUNTER — Other Ambulatory Visit: Payer: Self-pay

## 2023-02-08 DIAGNOSIS — Z7901 Long term (current) use of anticoagulants: Secondary | ICD-10-CM | POA: Diagnosis not present

## 2023-02-08 DIAGNOSIS — M25551 Pain in right hip: Secondary | ICD-10-CM | POA: Insufficient documentation

## 2023-02-08 LAB — CBC WITH DIFFERENTIAL/PLATELET
Abs Immature Granulocytes: 0.02 10*3/uL (ref 0.00–0.07)
Basophils Absolute: 0 10*3/uL (ref 0.0–0.1)
Basophils Relative: 0 %
Eosinophils Absolute: 0.1 10*3/uL (ref 0.0–0.5)
Eosinophils Relative: 1 %
HCT: 36.4 % — ABNORMAL LOW (ref 39.0–52.0)
Hemoglobin: 11.4 g/dL — ABNORMAL LOW (ref 13.0–17.0)
Immature Granulocytes: 0 %
Lymphocytes Relative: 12 %
Lymphs Abs: 1 10*3/uL (ref 0.7–4.0)
MCH: 30.3 pg (ref 26.0–34.0)
MCHC: 31.3 g/dL (ref 30.0–36.0)
MCV: 96.8 fL (ref 80.0–100.0)
Monocytes Absolute: 0.7 10*3/uL (ref 0.1–1.0)
Monocytes Relative: 8 %
Neutro Abs: 6.6 10*3/uL (ref 1.7–7.7)
Neutrophils Relative %: 79 %
Platelets: 175 10*3/uL (ref 150–400)
RBC: 3.76 MIL/uL — ABNORMAL LOW (ref 4.22–5.81)
RDW: 13.7 % (ref 11.5–15.5)
WBC: 8.4 10*3/uL (ref 4.0–10.5)
nRBC: 0 % (ref 0.0–0.2)

## 2023-02-08 LAB — COMPREHENSIVE METABOLIC PANEL
ALT: 13 U/L (ref 0–44)
AST: 17 U/L (ref 15–41)
Albumin: 3.5 g/dL (ref 3.5–5.0)
Alkaline Phosphatase: 75 U/L (ref 38–126)
Anion gap: 10 (ref 5–15)
BUN: 18 mg/dL (ref 8–23)
CO2: 27 mmol/L (ref 22–32)
Calcium: 9.2 mg/dL (ref 8.9–10.3)
Chloride: 103 mmol/L (ref 98–111)
Creatinine, Ser: 0.74 mg/dL (ref 0.61–1.24)
GFR, Estimated: >60 mL/min (ref >60)
Glucose, Bld: 111 mg/dL — ABNORMAL HIGH (ref 70–99)
Potassium: 3.9 mmol/L (ref 3.5–5.1)
Sodium: 140 mmol/L (ref 135–145)
Total Bilirubin: 0.5 mg/dL (ref 0.3–1.2)
Total Protein: 6.9 g/dL (ref 6.5–8.1)

## 2023-02-08 LAB — LIPASE, BLOOD: Lipase: 35 U/L (ref 11–51)

## 2023-02-08 NOTE — Discharge Instructions (Addendum)
Return for any problem.  ?

## 2023-02-08 NOTE — ED Provider Notes (Signed)
[place] AT [facility] Provider Note   CSN: [id number] Arrival date & time: 02/08/23  1909     History  Chief Complaint  Patient presents with   Abdominal Pain    Todd Estrada is a 62 y.o. male.  62 year old male with prior medical history as detailed below presents for evaluation.  Patient complains of pain to the right hip.  Patient denies any specific injury or fall.  Patient was recently evaluated for pain and injuries related to fall on February 11.  It appears that his discomfort today is secondary to that event.  The history is provided by the patient and medical records.       Home Medications Prior to Admission medications   Medication Sig Start Date End Date Taking? Authorizing Provider  albuterol (PROVENTIL) (2.5 MG/3ML) 0.083% nebulizer solution Take 3 mLs (2.5 mg total) by nebulization every 4 (four) hours as needed for wheezing or shortness of breath. 08/16/22   Lacinda Axon, MD  apixaban (ELIQUIS) 5 MG TABS tablet Take 1 tablet (5 mg total) by mouth 2 (two) times daily. 01/06/23   [name], MD  atorvastatin (LIPITOR) 40 MG tablet Take 1 tablet (40 mg total) by mouth daily. 01/03/23 01/03/24  [name], MD  budesonide (PULMICORT) 0.5 MG/2ML nebulizer solution NEW PRESCRIPTION REQEUST: BUDESONIDE 0.5 MG/ 2ML- USE ONE VIAL TWICE DAILY Patient taking differently: Take 0.5 mg by nebulization in the morning and at bedtime. 11/29/22   Freddi Starr, MD  folic acid (FOLVITE) 1 MG tablet Take 1 tablet (1 mg total) by mouth daily. 11/30/22   [name], MD  levETIRAcetam (KEPPRA) 500 MG tablet Take 1 tablet (500 mg total) by mouth 2 (two) times daily. 12/07/22   Frann Rider, NP  midodrine (PROAMATINE) 2.5 MG tablet Take 1 tablet (2.5 mg total) by mouth 3 (three) times daily with meals. 01/06/23 07/05/23  [name], MD  Multiple Vitamin (MULTIVITAMIN WITH MINERALS) TABS tablet Take 1 tablet by mouth  daily. Patient taking differently: Take 1 tablet by mouth daily with breakfast. 09/20/22   Gaylan Gerold, DO  ondansetron (ZOFRAN) 4 MG tablet Take 4 mg by mouth every 8 (eight) hours as needed for nausea or vomiting.    [provider]  Tiotropium Bromide-Olodaterol (STIOLTO RESPIMAT) 2.5-2.5 MCG/ACT AERS NEW PRESCRIPTION REQUEST: STIOLTO 2.5 MCG- INHALE TWO PUFFS BY MOUTH DAILY Patient taking differently: Inhale 2 each into the lungs daily. 11/29/22   Freddi Starr, MD  Tiotropium Bromide-Olodaterol (STIOLTO RESPIMAT) 2.5-2.5 MCG/ACT AERS Inhale 2 puffs into the lungs daily. Patient not taking: Reported on 01/18/2023 11/29/22   Freddi Starr, MD      Allergies    Patient has no known allergies.    Review of Systems   Review of Systems  All other systems reviewed and are negative.   Physical Exam Updated Vital Signs BP 115/84   Pulse 74   Temp 99.4 F (37.4 C) (Oral)   Resp 17   Ht 6' (1.829 m)   Wt 74.8 kg   SpO2 100%   BMI 22.38 kg/m  Physical Exam Vitals and nursing note reviewed.  Constitutional:      General: He is not in acute distress.    Appearance: Normal appearance. He is well-developed.  HENT:     Head: Normocephalic and atraumatic.  Eyes:     Conjunctiva/sclera: Conjunctivae normal.     Pupils: Pupils are equal, round, and reactive to light.  Cardiovascular:  Rate and Rhythm: Normal rate and regular rhythm.     Heart sounds: Normal heart sounds.  Pulmonary:     Effort: Pulmonary effort is normal. No respiratory distress.     Breath sounds: Normal breath sounds.  Abdominal:     General: There is no distension.     Palpations: Abdomen is soft.     Tenderness: There is no abdominal tenderness.  Musculoskeletal:        General: Tenderness present. No deformity. Normal range of motion.     Cervical back: Normal range of motion and neck supple.     Comments: Mild tenderness with palpation of the right posterior lateral hip.  Full active  range of motion of the right hip.  Patient is ambulatory with no significant antalgic gait.  Distal right lower extremity with intact pulses.   Skin:    General: Skin is warm and dry.  Neurological:     General: No focal deficit present.     Mental Status: He is alert and oriented to person, place, and time.     ED Results / Procedures / Treatments   Labs (all labs ordered are listed, but only abnormal results are displayed) Labs Reviewed  CBC WITH DIFFERENTIAL/PLATELET - Abnormal; Notable for the following components:      Result Value   RBC 3.76 (*)    Hemoglobin 11.4 (*)    HCT 36.4 (*)    All other components within normal limits  COMPREHENSIVE METABOLIC PANEL - Abnormal; Notable for the following components:   Glucose, Bld 111 (*)    All other components within normal limits  LIPASE, BLOOD  URINALYSIS, ROUTINE W REFLEX MICROSCOPIC    EKG None  Radiology DG Hip Unilat W or Wo Pelvis 2-3 Views Right  Result Date: 02/08/2023 CLINICAL DATA:  pain post fall. Per charts: Pt c/o right sided flank pain. No urinary s/s. Denies recent injury/fall. Was evaluated for a fall on 2/11. Pt is a poor historian EXAM: DG HIP (WITH OR WITHOUT PELVIS) 2-3V RIGHT COMPARISON:  X-ray pelvis 10/23/2022. FINDINGS: Limited evaluation due to overlapping osseous structures and overlying soft tissues. There is no evidence of hip fracture or dislocation of the right hip. Lucency overlying the right superior pubic rami suggestive of overlying soft tissue densities. Frontal view of the left hip grossly unremarkable. No acute displaced fracture or diastasis of the bones of the pelvis. There is no evidence of arthropathy or other focal bone abnormality. Vascular calcifications IMPRESSION: Negative for acute traumatic injury. Electronically Signed   By: Iven Finn M.D.   On: 02/08/2023 21:50   DG Lumbar Spine 2-3 Views  Result Date: 02/08/2023 CLINICAL DATA:  Lower back pain.  Right-sided flank pain.  EXAM: LUMBAR SPINE - 2-3 VIEW COMPARISON:  CT abdomen and pelvis 11/27/2022 FINDINGS: There are 5 non-rib-bearing lumbar-type vertebral bodies. The provided AP view is mildly obliqued. There is no sagittal spondylolisthesis. Vertebral body heights are maintained. No significant disc space narrowing. There is moderate L5-S1 and mild L4-5 facet joint arthropathy, similar to prior. Moderate atherosclerotic calcifications. IMPRESSION: Moderate L5-S1 and mild L4-5 facet joint arthropathy, similar to prior. Electronically Signed   By: Yvonne Kendall M.D.   On: 02/08/2023 20:21    Procedures Procedures    Medications Ordered in ED Medications - No data to display  ED Course/ Medical Decision Making/ A&P  Medical Decision Making Amount and/or Complexity of Data Reviewed Radiology: ordered.    Medical Screen Complete  This patient presented to the ED with complaint of right hip pain.  This complaint involves an extensive number of treatment options. The initial differential diagnosis includes, but is not limited to, pain related to fall, metabolic abnormality, etc.  This presentation is: Acute, Chronic, Self-Limited, Previously Undiagnosed, Uncertain Prognosis, Complicated, Systemic Symptoms, and Threat to Life/Bodily Function  Patient presents with complaint of pain to the right hip.  Imaging obtained is without significant abnormality.  Screening labs are without significant abnormality.  Patient's pain is likely contusion related to recent fall.  Importance of close follow-up in the outpatient setting is stressed.  Strict return precautions given and understood.  Additional history obtained:  External records from outside sources obtained and reviewed including prior ED visits and prior Inpatient records.    Lab Tests:  I ordered and personally interpreted labs.  The pertinent results include: BC, CMP, lipase   Imaging Studies ordered:  I ordered  imaging studies including films of right hip, pelvis, lumbar spine I independently visualized and interpreted obtained imaging which showed NAD I agree with the radiologist interpretation.   Problem List / ED Course:  Right hip pain   Reevaluation:  After the interventions noted above, I reevaluated the patient and found that they have: improved   Disposition:  After consideration of the diagnostic results and the patients response to treatment, I feel that the patent would benefit from outpatient follow-up.          Final Clinical Impression(s) / ED Diagnoses Final diagnoses:  Right hip pain    Rx / DC Orders ED Discharge Orders     None         Valarie Merino, MD 02/08/23 2227

## 2023-02-08 NOTE — ED Triage Notes (Addendum)
Pt c/o right sided flank pain.  No urinary s/s. Denies recent injury/fall. Was evaluated for a fall on 2/11. Pt is a poor historian.

## 2023-02-08 NOTE — ED Provider Triage Note (Signed)
Emergency Medicine Provider Triage Evaluation Note  Todd Estrada , a 62 y.o. male  was evaluated in triage.  Pt complains of low back pain and abdominal pain.  History is difficult to obtain as patient's speech is incomprehensible.  Patient was evaluated for fall on 2/11.  Review of Systems  Positive: As above Negative: As above  Physical Exam  BP 105/82   Pulse 93   Temp 99.4 F (37.4 C) (Oral)   Resp 17   Ht 6' (1.829 m)   Wt 74.8 kg   SpO2 100%   BMI 22.38 kg/m  Gen:   Awake, no distress   Resp:  Normal effort  MSK:   Moves extremities without difficulty  Other:    Medical Decision Making  Medically screening exam initiated at 7:44 PM.  Appropriate orders placed.  [name] was informed that the remainder of the evaluation will be completed by another provider, this initial triage assessment does not replace that evaluation, and the importance of remaining in the ED until their evaluation is complete.    Rex Kras, [place] 02/09/23 [phone]

## 2023-02-09 ENCOUNTER — Emergency Department (HOSPITAL_COMMUNITY)
Admission: EM | Admit: 2023-02-09 | Discharge: 2023-02-09 | Disposition: A | Discharge status: Home / Self Care | Payer: Medicaid Other | Attending: Emergency Medicine | Admitting: Emergency Medicine

## 2023-02-09 DIAGNOSIS — L723 Sebaceous cyst: Secondary | ICD-10-CM | POA: Insufficient documentation

## 2023-02-09 DIAGNOSIS — L0231 Cutaneous abscess of buttock: Secondary | ICD-10-CM | POA: Diagnosis present

## 2023-02-09 DIAGNOSIS — L089 Local infection of the skin and subcutaneous tissue, unspecified: Secondary | ICD-10-CM

## 2023-02-09 DIAGNOSIS — Z7901 Long term (current) use of anticoagulants: Secondary | ICD-10-CM | POA: Diagnosis not present

## 2023-02-09 MED ORDER — DOXYCYCLINE HYCLATE 100 MG PO CAPS: 100.0000 mg | ORAL_CAPSULE | Freq: Two times a day (BID) | ORAL | 0 refills | Status: DC

## 2023-02-09 MED ORDER — LIDOCAINE-EPINEPHRINE (PF) 2 %-1:200000 IJ SOLN
10.0000 mL | Freq: Once | INTRAMUSCULAR | Status: AC
  Administered 2023-02-09: 10 mL
  Filled 2023-02-09: qty 20

## 2023-02-09 NOTE — ED Notes (Signed)
I&D tray at bedside for Dr. Johnney Killian

## 2023-02-09 NOTE — ED Triage Notes (Signed)
C/o abscess above buttocks x1 day Pt reports yesterday seen in ER for back pain and when son gave bath today noticed abscess with clear drainage. Denies fever.

## 2023-02-09 NOTE — ED Provider Notes (Signed)
[place] AT [facility] Provider Note   CSN: [id number] Arrival date & time: 02/09/23  1605     History  Chief Complaint  Patient presents with   Abscess    Todd Estrada is a 62 y.o. male.  HPI Patient has history of prior stroke and has difficulty with communication.  Patient is being seen with his cousin who is his caregiver is well.   Cousin reports that he brought the patient in yesterday because he was indicating pain in his right hip or buttock area.  Evaluation from yesterday did not identify a source.  Patient's cousin reports today he was helping the patient to bathe and change and noted drainage on his pants.  He then identified an area on the right buttock that looks like an infected abscess.  He now is quite sure that the area of pain.  The patient is verbal and alert and can endorse questions but has an expressive aphasia.  Does endorse that that is a very painful area for him.    Home Medications Prior to Admission medications   Medication Sig Start Date End Date Taking? Authorizing Provider  doxycycline (VIBRAMYCIN) 100 MG capsule Take 1 capsule (100 mg total) by mouth 2 (two) times daily. 02/09/23  Yes Charlesetta Shanks, MD  albuterol (PROVENTIL) (2.5 MG/3ML) 0.083% nebulizer solution Take 3 mLs (2.5 mg total) by nebulization every 4 (four) hours as needed for wheezing or shortness of breath. 08/16/22   Lacinda Axon, MD  apixaban (ELIQUIS) 5 MG TABS tablet Take 1 tablet (5 mg total) by mouth 2 (two) times daily. 01/06/23   [name], MD  atorvastatin (LIPITOR) 40 MG tablet Take 1 tablet (40 mg total) by mouth daily. 01/03/23 01/03/24  [name], MD  budesonide (PULMICORT) 0.5 MG/2ML nebulizer solution NEW PRESCRIPTION REQEUST: BUDESONIDE 0.5 MG/ 2ML- USE ONE VIAL TWICE DAILY Patient taking differently: Take 0.5 mg by nebulization in the morning and at bedtime. 11/29/22   Freddi Starr, MD  folic acid (FOLVITE) 1 MG tablet  Take 1 tablet (1 mg total) by mouth daily. 11/30/22   [name], MD  levETIRAcetam (KEPPRA) 500 MG tablet Take 1 tablet (500 mg total) by mouth 2 (two) times daily. 12/07/22   Frann Rider, NP  midodrine (PROAMATINE) 2.5 MG tablet Take 1 tablet (2.5 mg total) by mouth 3 (three) times daily with meals. 01/06/23 07/05/23  [name], MD  Multiple Vitamin (MULTIVITAMIN WITH MINERALS) TABS tablet Take 1 tablet by mouth daily. Patient taking differently: Take 1 tablet by mouth daily with breakfast. 09/20/22   Gaylan Gerold, DO  ondansetron (ZOFRAN) 4 MG tablet Take 4 mg by mouth every 8 (eight) hours as needed for nausea or vomiting.    [provider]  Tiotropium Bromide-Olodaterol (STIOLTO RESPIMAT) 2.5-2.5 MCG/ACT AERS NEW PRESCRIPTION REQUEST: STIOLTO 2.5 MCG- INHALE TWO PUFFS BY MOUTH DAILY Patient taking differently: Inhale 2 each into the lungs daily. 11/29/22   Freddi Starr, MD  Tiotropium Bromide-Olodaterol (STIOLTO RESPIMAT) 2.5-2.5 MCG/ACT AERS Inhale 2 puffs into the lungs daily. Patient not taking: Reported on 01/18/2023 11/29/22   Freddi Starr, MD      Allergies    Patient has no known allergies.    Review of Systems   Review of Systems  Physical Exam Updated Vital Signs BP 114/76   Pulse 76   Temp 98.9 F (37.2 C) (Oral)   Resp 16   SpO2 98%  Physical Exam Constitutional:  Comments: Patient is alert.  No acute distress.  HENT:     Mouth/Throat:     Pharynx: Oropharynx is clear.  Cardiovascular:     Rate and Rhythm: Normal rate and regular rhythm.  Pulmonary:     Effort: Pulmonary effort is normal.     Breath sounds: Normal breath sounds.  Abdominal:     General: There is no distension.     Palpations: Abdomen is soft.     Tenderness: There is no abdominal tenderness. There is no guarding.  Musculoskeletal:     Comments: Extremities are in good condition.  No wounds or significant swelling.  Skin:    General: Skin is warm and dry.      Comments: Patient has an erythematous nodule on the right buttock.  This is about 5 cm away from the gluteal cleft in the soft tissues of the high upper buttock.  Focal fluctuant feeling nodule about 4 cm and surrounding erythema with a diameter of about 8 cm.  This is all confined to this local area without diffuse erythema or induration of the buttock.  Neurological:     Comments: Patient is alert, he does communicate with a few words and various interactions with his caregiver.  He can assistant following commands is in rolling from side-to-side and helping and positioning.     ED Results / Procedures / Treatments   Labs (all labs ordered are listed, but only abnormal results are displayed) Labs Reviewed - No data to display  EKG None  Radiology DG Hip Unilat W or Wo Pelvis 2-3 Views Right  Result Date: 02/08/2023 CLINICAL DATA:  pain post fall. Per charts: Pt c/o right sided flank pain. No urinary s/s. Denies recent injury/fall. Was evaluated for a fall on 2/11. Pt is a poor historian EXAM: DG HIP (WITH OR WITHOUT PELVIS) 2-3V RIGHT COMPARISON:  X-ray pelvis 10/23/2022. FINDINGS: Limited evaluation due to overlapping osseous structures and overlying soft tissues. There is no evidence of hip fracture or dislocation of the right hip. Lucency overlying the right superior pubic rami suggestive of overlying soft tissue densities. Frontal view of the left hip grossly unremarkable. No acute displaced fracture or diastasis of the bones of the pelvis. There is no evidence of arthropathy or other focal bone abnormality. Vascular calcifications IMPRESSION: Negative for acute traumatic injury. Electronically Signed   By: Iven Finn M.D.   On: 02/08/2023 21:50   DG Lumbar Spine 2-3 Views  Result Date: 02/08/2023 CLINICAL DATA:  Lower back pain.  Right-sided flank pain. EXAM: LUMBAR SPINE - 2-3 VIEW COMPARISON:  CT abdomen and pelvis 11/27/2022 FINDINGS: There are 5 non-rib-bearing lumbar-type  vertebral bodies. The provided AP view is mildly obliqued. There is no sagittal spondylolisthesis. Vertebral body heights are maintained. No significant disc space narrowing. There is moderate L5-S1 and mild L4-5 facet joint arthropathy, similar to prior. Moderate atherosclerotic calcifications. IMPRESSION: Moderate L5-S1 and mild L4-5 facet joint arthropathy, similar to prior. Electronically Signed   By: Yvonne Kendall M.D.   On: 02/08/2023 20:21    Procedures .Marland KitchenIncision and Drainage  Date/Time: 02/09/2023 5:44 PM  Performed by: Charlesetta Shanks, MD Authorized by: Charlesetta Shanks, MD   Consent:    Consent obtained:  Verbal   Consent given by:  Healthcare agent   Risks discussed:  Bleeding, incomplete drainage and pain Location:    Location:  Trunk   Trunk location:  Back Pre-procedure details:    Skin preparation:  Povidone-iodine Anesthesia:    Anesthesia method:  Local infiltration   Local anesthetic:  Lidocaine 2% WITH epi Procedure type:    Complexity:  Complex Procedure details:    Incision types:  Single straight   Incision depth:  Subcutaneous   Wound management:  Probed and deloculated   Drainage:  Bloody and serosanguinous   Drainage amount:  Scant   Packing materials:  1/4 in iodoform gauze   Amount 1/4" iodoform:  10 Post-procedure details:    Procedure completion:  Tolerated well, no immediate complications Comments:     Straight incision yielded some slightly purulent and serosanguineous material.  However the body of the nodule is cystic in nature.  Some caseous material expressed.  Pocket was deloculated and explored.  No large pus pocket present.  Iodoform gauze packing placed.     Medications Ordered in ED Medications  lidocaine-EPINEPHrine (XYLOCAINE W/EPI) 2 %-1:200000 (PF) injection 10 mL (10 mLs Infiltration Given 02/09/23 1655)    ED Course/ Medical Decision Making/ A&P                             Medical Decision Making Risk Prescription drug  management.   Patient presents with a tender erythematous nodule.  This did have soft fluctuant area over it.  Patient's caregiver described it as having already drained some earlier.  Incision and drainage done.  The body of this nodule is cystic in nature and fibrous.  There was some caseous material expressed and some slightly purulent serosanguineous material.  Loculated and packed.  Patient tolerated this well.  Is focal area of erythema without induration to suggest deeper soft tissue infection.  Will start patient on doxycycline and have recommended follow-up in 2 to 3 days.  Return precautions reviewed.        Final Clinical Impression(s) / ED Diagnoses Final diagnoses:  Infected cyst of skin    Rx / DC Orders ED Discharge Orders          Ordered    doxycycline (VIBRAMYCIN) 100 MG capsule  2 times daily        02/09/23 1736              Charlesetta Shanks, MD 02/09/23 [phone]

## 2023-02-09 NOTE — Discharge Instructions (Signed)
1.  You have an infected cyst.  This was opened up and there was a small amount of drainage and cystic material.  A packing was placed.  You should have a recheck with your doctor or the emergency department in 2 to 3 days to see if the packing needs to be replaced. 2.  To get this completely removed, you should schedule follow-up with a surgeon to excise the cyst.  They are easier to remove when you do not have an infection at the same time.  You will take your antibiotics and allow this to heal and then follow-up complete removal of the cyst if needed. 3.  Return to the emergency department sooner if you develop a fever, increasing redness despite antibiotics or other concerning changes.

## 2023-02-14 ENCOUNTER — Ambulatory Visit: Payer: Medicaid Other | Admitting: Podiatry

## 2023-02-14 ENCOUNTER — Other Ambulatory Visit: Payer: Self-pay

## 2023-02-14 ENCOUNTER — Emergency Department (HOSPITAL_COMMUNITY)
Admission: EM | Admit: 2023-02-14 | Discharge: 2023-02-14 | Disposition: A | Discharge status: Home / Self Care | Payer: Medicaid Other | Attending: Emergency Medicine | Admitting: Emergency Medicine

## 2023-02-14 DIAGNOSIS — Z7901 Long term (current) use of anticoagulants: Secondary | ICD-10-CM | POA: Diagnosis not present

## 2023-02-14 DIAGNOSIS — Z5189 Encounter for other specified aftercare: Secondary | ICD-10-CM

## 2023-02-14 DIAGNOSIS — Z8673 Personal history of transient ischemic attack (TIA), and cerebral infarction without residual deficits: Secondary | ICD-10-CM | POA: Diagnosis not present

## 2023-02-14 DIAGNOSIS — Z4800 Encounter for change or removal of nonsurgical wound dressing: Secondary | ICD-10-CM | POA: Insufficient documentation

## 2023-02-14 NOTE — Discharge Instructions (Signed)
It was a pleasure taking care of you today.  Your packing was removed.  Keep area clean.  Continue taking your antibiotic as previously prescribed.  Please follow-up with PCP within 1 week for recheck.  Return to the ER for new or worsening symptoms.

## 2023-02-14 NOTE — ED Provider Triage Note (Signed)
Emergency Medicine Provider Triage Evaluation Note  Todd Estrada , a 62 y.o. male  was evaluated in triage.  Pt complains of concerns for wound check had abscess I&D on 02/09/23. Was told to return for wound check and packing removal. Denies fever or increased pain to the area. Has been taking his antibiotics as prescribed.  Review of Systems  Positive:  Negative:   Physical Exam  BP 112/76   Pulse 81   Temp 98 F (36.7 C)   Resp 20   SpO2 100%  Gen:   Awake, no distress   Resp:  Normal effort  MSK:   Moves extremities without difficulty  Other:  Erythema noted to I&D site, seemingly smaller in size from previous I&D. Packing in place. Purulent drainage noted.  Medical Decision Making  Medically screening exam initiated at 2:46 PM.  Appropriate orders placed.  [name] was informed that the remainder of the evaluation will be completed by another provider, this initial triage assessment does not replace that evaluation, and the importance of remaining in the ED until their evaluation is complete.  Work-up initiated.    Shonette Rhames A, PA-C 02/14/23 1446

## 2023-02-14 NOTE — ED Triage Notes (Signed)
Abscess with I&D and packing on 2/22 to right buttocks.  Pt presents today for wound recheck and removal of packing.  Tender to palpation.  Denies fever.

## 2023-02-14 NOTE — ED Provider Notes (Signed)
[place] AT [facility] Provider Note   CSN: [id number] Arrival date & time: 02/14/23  1426     History  Chief Complaint  Patient presents with   Wound Check    Todd Estrada is a 62 y.o. male with a past medical history significant for previous CVA with communication difficulties who presents to the ED for a wound recheck.  Patient had an I&D to cyst on right buttocks on 2/22.  He returns today for a wound recheck with his cousin who is his caregiver.  Cousin notes area has improved.  Patient has been compliant with his doxycycline.  No fever or chills.  No purulent drainage from site.  Patient still has packing in place.  No history of diabetes.  History obtained from patient, cousin, and past medical records. No interpreter used during encounter.       Home Medications Prior to Admission medications   Medication Sig Start Date End Date Taking? Authorizing Provider  albuterol (PROVENTIL) (2.5 MG/3ML) 0.083% nebulizer solution Take 3 mLs (2.5 mg total) by nebulization every 4 (four) hours as needed for wheezing or shortness of breath. 08/16/22   Lacinda Axon, MD  apixaban (ELIQUIS) 5 MG TABS tablet Take 1 tablet (5 mg total) by mouth 2 (two) times daily. 01/06/23   [name], MD  atorvastatin (LIPITOR) 40 MG tablet Take 1 tablet (40 mg total) by mouth daily. 01/03/23 01/03/24  [name], MD  budesonide (PULMICORT) 0.5 MG/2ML nebulizer solution NEW PRESCRIPTION REQEUST: BUDESONIDE 0.5 MG/ 2ML- USE ONE VIAL TWICE DAILY Patient taking differently: Take 0.5 mg by nebulization in the morning and at bedtime. 11/29/22   Freddi Starr, MD  doxycycline (VIBRAMYCIN) 100 MG capsule Take 1 capsule (100 mg total) by mouth 2 (two) times daily. 02/09/23   Charlesetta Shanks, MD  folic acid (FOLVITE) 1 MG tablet Take 1 tablet (1 mg total) by mouth daily. 11/30/22   [name], MD  levETIRAcetam (KEPPRA) 500 MG tablet Take 1 tablet (500 mg total) by  mouth 2 (two) times daily. 12/07/22   Frann Rider, NP  midodrine (PROAMATINE) 2.5 MG tablet Take 1 tablet (2.5 mg total) by mouth 3 (three) times daily with meals. 01/06/23 07/05/23  [name], MD  Multiple Vitamin (MULTIVITAMIN WITH MINERALS) TABS tablet Take 1 tablet by mouth daily. Patient taking differently: Take 1 tablet by mouth daily with breakfast. 09/20/22   Gaylan Gerold, DO  ondansetron (ZOFRAN) 4 MG tablet Take 4 mg by mouth every 8 (eight) hours as needed for nausea or vomiting.    [provider]  Tiotropium Bromide-Olodaterol (STIOLTO RESPIMAT) 2.5-2.5 MCG/ACT AERS NEW PRESCRIPTION REQUEST: STIOLTO 2.5 MCG- INHALE TWO PUFFS BY MOUTH DAILY Patient taking differently: Inhale 2 each into the lungs daily. 11/29/22   Freddi Starr, MD  Tiotropium Bromide-Olodaterol (STIOLTO RESPIMAT) 2.5-2.5 MCG/ACT AERS Inhale 2 puffs into the lungs daily. Patient not taking: Reported on 01/18/2023 11/29/22   Freddi Starr, MD      Allergies    Patient has no known allergies.    Review of Systems   Review of Systems  Constitutional:  Negative for chills and fever.  Skin:  Positive for wound.  All other systems reviewed and are negative.   Physical Exam Updated Vital Signs BP 112/76   Pulse 81   Temp 98 F (36.7 C)   Resp 20   SpO2 100%  Physical Exam Vitals and nursing note reviewed.  Constitutional:  General: He is not in acute distress.    Appearance: He is not ill-appearing.  HENT:     Head: Normocephalic.  Eyes:     Pupils: Pupils are equal, round, and reactive to light.  Cardiovascular:     Rate and Rhythm: Normal rate and regular rhythm.     Pulses: Normal pulses.     Heart sounds: Normal heart sounds. No murmur heard.    No friction rub. No gallop.  Pulmonary:     Effort: Pulmonary effort is normal.     Breath sounds: Normal breath sounds.  Abdominal:     General: Abdomen is flat. There is no distension.     Palpations: Abdomen is soft.      Tenderness: There is no abdominal tenderness. There is no guarding or rebound.  Genitourinary:    Comments: Wound recheck with chaperone in room.  Packing in place which was removed.  No purulent drainage.  Mild erythema surrounding I&D site which appears to be an improvement from previous provider note. Musculoskeletal:        General: Normal range of motion.     Cervical back: Neck supple.  Skin:    General: Skin is warm and dry.  Neurological:     General: No focal deficit present.     Mental Status: He is alert.  Psychiatric:        Mood and Affect: Mood normal.        Behavior: Behavior normal.     ED Results / Procedures / Treatments   Labs (all labs ordered are listed, but only abnormal results are displayed) Labs Reviewed - No data to display  EKG None  Radiology No results found.  Procedures Procedures    Medications Ordered in ED Medications - No data to display  ED Course/ Medical Decision Making/ A&P                             Medical Decision Making Amount and/or Complexity of Data Reviewed Independent Historian: caregiver External Data Reviewed: notes.    Details: I&D note   62 year old male presents to the ED for a wound recheck.  Had I&D on 2/22.  History of CVA with communication difficulties.  Cousin who is caregiver is at bedside.  No fever or chills.  No history of diabetes.  Upon arrival, vitals all within normal limits.  Patient in no acute distress.  Small area on right buttocks where I&D was performed with packing in place.  Packing removed.  No purulent drainage.  Very mild erythema surrounding site which appears to be an improvement from initial evaluation on 2/22 per previous provider note.  Advised cousin to have patient continue taking his doxycycline as previously prescribed.  Follow-up with PCP within 1 week for recheck.  Patient stable for discharge. Strict ED precautions discussed with patient. Patient states understanding and agrees to  plan. Patient discharged home in no acute distress and stable vitals  Has PCP       Final Clinical Impression(s) / ED Diagnoses Final diagnoses:  Visit for wound check    Rx / DC Orders ED Discharge Orders     None         Karie Kirks 02/14/23 1521    Fredia Sorrow, MD 02/15/23 [phone]

## 2023-02-27 ENCOUNTER — Ambulatory Visit: Payer: Medicaid Other | Admitting: Podiatry

## 2023-03-01 ENCOUNTER — Ambulatory Visit (INDEPENDENT_AMBULATORY_CARE_PROVIDER_SITE_OTHER): Payer: Medicaid Other | Admitting: Student

## 2023-03-01 ENCOUNTER — Other Ambulatory Visit: Payer: Self-pay

## 2023-03-01 ENCOUNTER — Encounter: Payer: Self-pay | Admitting: Student

## 2023-03-01 VITALS — BP 103/66 | HR 66 | Temp 98.6°F | Resp 28 | Ht 72.0 in | Wt 164.1 lb

## 2023-03-01 DIAGNOSIS — S31819D Unspecified open wound of right buttock, subsequent encounter: Secondary | ICD-10-CM | POA: Diagnosis present

## 2023-03-01 DIAGNOSIS — R296 Repeated falls: Secondary | ICD-10-CM

## 2023-03-01 DIAGNOSIS — I9589 Other hypotension: Secondary | ICD-10-CM

## 2023-03-01 NOTE — Assessment & Plan Note (Signed)
On chronic midodrine therapy at 2.'5mg'$  TID. BP is 103/66 today which is around his baseline. Denies any dizziness or lightheadedness.

## 2023-03-01 NOTE — Assessment & Plan Note (Signed)
Patient had an infected cyst on the upper aspect of right buttock. He was seen in the ED where the cyst was lanced and packed with dressing, and he was prescribed antibiotics for a short course. He had another ED visit for wound recheck a few days later where the wound appeared to be healing well. He has since finished his antibiotic course and is doing well. He is here today to recheck the wound and ensure appropriate healing. He denies any fevers, chills, or other systemic symptoms.  On exam, the wound appears to be healing well. He has some mild erythema adjacent to the wound, but no concern for infection at this time. No TTP and no discharge noted.  We discussed frequent repositioning as he typically stays in his wheelchair or in the bed in order to offload pressure to the wound site. His niece is here with him today and states that they will pay close attention to repositioning. Otherwise, no new recommendations as it appears the wound is healing well.

## 2023-03-01 NOTE — Assessment & Plan Note (Signed)
Patient with frequent falls and several recent ED visits for this. Patient and niece state that this occurs primarily during transfers when the patient's room is cluttered. They have been trying to clean it to avoid falls. 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 Will continue to monitor this for now as he has not suffered any head injuries from these falls.

## 2023-03-01 NOTE — Patient Instructions (Signed)
[name],  It was a pleasure seeing you in the clinic today.   Your wound looks much better. As we discussed, please try to reposition yourself periodically to avoid having any extra pressure to the wound. Please come back in 3 months for your next visit.  Please call our clinic at [phone] if you have any questions or concerns. The best time to call is Monday-Friday from 9am-4pm, but there is someone available 24/7 at the same number. If you need medication refills, please notify your pharmacy one week in advance and they will send [place] a request.   Thank you for letting us take part in your care. We look forward to seeing you next time!

## 2023-03-01 NOTE — Progress Notes (Signed)
   CC: wound recheck  HPI:  ToddTodd Estrada is a 62 y.o. male with history listed below presenting to the [facility] for wound recheck. Please see individualized problem based charting for full HPI.  Past Medical History:  Diagnosis Date   Asthma    Atypical chest pain 08/13/2022   Community acquired pneumonia 09/14/2022   COPD (chronic obstructive pulmonary disease) ([place])    Essential hypertension 08/19/2021   GERD (gastroesophageal reflux disease)    HAP (hospital-acquired pneumonia) 09/16/2022   History of tracheostomy    03/09/22-04/11/22   HLD (hyperlipidemia)    Hypertension    Hypokalemia 08/13/2022   Hypomagnesemia 08/13/2022   Lung cancer ([place])    PAD (peripheral artery disease) ([place])    Seizures ([place]) 06/02/2022   Sepsis ([place]) 08/13/2022   Stroke ([place]) 02/2022    Review of Systems:  Negative aside from that listed in individualized problem based charting.  Physical Exam:  Vitals:   03/01/23 1336  BP: 103/66  Pulse: 66  Resp: (!) 28  Temp: 98.6 F (37 C)  TempSrc: Oral  SpO2: 100%  Weight: 164 lb 1.6 oz (74.4 kg)  Height: 6' (1.829 m)   Physical Exam Constitutional:      Comments: Chronically ill-appearing elderly male, sitting in wheelchair, NAD.  HENT:     Head: Normocephalic and atraumatic.     Mouth/Throat:     Mouth: Mucous membranes are moist.     Pharynx: Oropharynx is clear. No oropharyngeal exudate.  Eyes:     General: No scleral icterus.    Extraocular Movements: Extraocular movements intact.     Conjunctiva/sclera: Conjunctivae normal.     Pupils: Pupils are equal, round, and reactive to light.  Cardiovascular:     Rate and Rhythm: Normal rate and regular rhythm.     Pulses: Normal pulses.     Heart sounds: Normal heart sounds. No murmur heard.    No gallop.  Pulmonary:     Effort: Pulmonary effort is normal.     Breath sounds: Normal breath sounds. No wheezing, rhonchi or rales.  Abdominal:     General: Bowel sounds are normal.      Palpations: Abdomen is soft.     Tenderness: There is no abdominal tenderness. There is no guarding or rebound.  Musculoskeletal:        General: Normal range of motion.  Skin:    General: Skin is warm and dry.     Comments: Wound appears to be healing well. Mild erythema adjacent to wound, but no TTP or discharge noted. Please see image below.  Neurological:     Mental Status: Mental status is at baseline.     Comments: Aphasic, chronic. R sided weakness, chronic.  Psychiatric:        Mood and Affect: Mood normal.        Behavior: Behavior normal.      Assessment & Plan:   See Encounters Tab for problem based charting.  Patient discussed with Dr.  Cain Sieve

## 2023-03-06 NOTE — Progress Notes (Signed)
Internal Medicine Clinic Attending  Case discussed with Dr. Allyson Sabal  At the time of the visit.  We reviewed the resident's history and exam and pertinent patient test results.  I agree with the assessment, diagnosis, and plan of care documented in the resident's note.    We discussed the risks/benefits of continuing Eliquis with patient's frequent falls. Right now, since he recently (09/2022) had a PE in the setting of an ongoing risk factor (lung cancer), and has had an ischemic stroke, I do think the benefit of Eliquis outweighs the risks, so we will continue this medicine. Continue to weigh risk/benefit at every visit. Dr. Allyson Sabal will see patient again in June prior to graduation.

## 2023-03-09 ENCOUNTER — Emergency Department (HOSPITAL_COMMUNITY): Payer: Medicaid Other

## 2023-03-09 ENCOUNTER — Emergency Department (HOSPITAL_COMMUNITY)
Admission: EM | Admit: 2023-03-09 | Discharge: 2023-03-09 | Disposition: A | Discharge status: Home / Self Care | Payer: Medicaid Other | Attending: Emergency Medicine | Admitting: Emergency Medicine

## 2023-03-09 ENCOUNTER — Other Ambulatory Visit: Payer: Self-pay

## 2023-03-09 ENCOUNTER — Encounter (HOSPITAL_COMMUNITY): Payer: Self-pay

## 2023-03-09 DIAGNOSIS — Z7901 Long term (current) use of anticoagulants: Secondary | ICD-10-CM | POA: Insufficient documentation

## 2023-03-09 DIAGNOSIS — W1811XA Fall from or off toilet without subsequent striking against object, initial encounter: Secondary | ICD-10-CM | POA: Insufficient documentation

## 2023-03-09 DIAGNOSIS — J189 Pneumonia, unspecified organism: Secondary | ICD-10-CM

## 2023-03-09 DIAGNOSIS — R079 Chest pain, unspecified: Secondary | ICD-10-CM | POA: Diagnosis present

## 2023-03-09 DIAGNOSIS — C3490 Malignant neoplasm of unspecified part of unspecified bronchus or lung: Secondary | ICD-10-CM | POA: Diagnosis not present

## 2023-03-09 LAB — CBC WITH DIFFERENTIAL/PLATELET
Abs Immature Granulocytes: 0.02 10*3/uL (ref 0.00–0.07)
Basophils Absolute: 0 10*3/uL (ref 0.0–0.1)
Basophils Relative: 0 %
Eosinophils Absolute: 0.1 10*3/uL (ref 0.0–0.5)
Eosinophils Relative: 3 %
HCT: 36.2 % — ABNORMAL LOW (ref 39.0–52.0)
Hemoglobin: 11.4 g/dL — ABNORMAL LOW (ref 13.0–17.0)
Immature Granulocytes: 0 %
Lymphocytes Relative: 15 %
Lymphs Abs: 0.8 10*3/uL (ref 0.7–4.0)
MCH: 30.3 pg (ref 26.0–34.0)
MCHC: 31.5 g/dL (ref 30.0–36.0)
MCV: 96.3 fL (ref 80.0–100.0)
Monocytes Absolute: 0.5 10*3/uL (ref 0.1–1.0)
Monocytes Relative: 10 %
Neutro Abs: 3.8 10*3/uL (ref 1.7–7.7)
Neutrophils Relative %: 72 %
Platelets: 166 10*3/uL (ref 150–400)
RBC: 3.76 MIL/uL — ABNORMAL LOW (ref 4.22–5.81)
RDW: 13.2 % (ref 11.5–15.5)
WBC: 5.2 10*3/uL (ref 4.0–10.5)
nRBC: 0 % (ref 0.0–0.2)

## 2023-03-09 LAB — BASIC METABOLIC PANEL
Anion gap: 6 (ref 5–15)
BUN: 14 mg/dL (ref 8–23)
CO2: 30 mmol/L (ref 22–32)
Calcium: 8.9 mg/dL (ref 8.9–10.3)
Chloride: 104 mmol/L (ref 98–111)
Creatinine, Ser: 0.72 mg/dL (ref 0.61–1.24)
GFR, Estimated: >60 mL/min (ref >60)
Glucose, Bld: 107 mg/dL — ABNORMAL HIGH (ref 70–99)
Potassium: 3.9 mmol/L (ref 3.5–5.1)
Sodium: 140 mmol/L (ref 135–145)

## 2023-03-09 LAB — TROPONIN I (HIGH SENSITIVITY): Troponin I (High Sensitivity): 5 ng/L (ref <18)

## 2023-03-09 MED ORDER — DOXYCYCLINE HYCLATE 100 MG PO TABS
100.0000 mg | ORAL_TABLET | Freq: Once | ORAL | Status: AC
  Administered 2023-03-09: 100 mg via ORAL
  Filled 2023-03-09: qty 1

## 2023-03-09 MED ORDER — DOXYCYCLINE HYCLATE 100 MG PO CAPS: 100.0000 mg | ORAL_CAPSULE | Freq: Two times a day (BID) | ORAL | 0 refills | Status: DC

## 2023-03-09 NOTE — ED Provider Notes (Signed)
[facility] Provider Note   CSN: [id number] Arrival date & time: 03/09/23  1134     History  Chief Complaint  Patient presents with   Fall   Chest Pain    Todd Estrada is a 62 y.o. male.  Patient fell off the bedside commode today.  Landed on the left side.  Caregiver states he stated his head.  Seems like he is on Eliquis.  He denies hitting his head or having any pain.  Patient has lung cancer.  He is not on chemo or radiation.  Chronic cough.  Denies any other extremity pain.  The history is provided by the patient and a caregiver.       Home Medications Prior to Admission medications   Medication Sig Start Date End Date Taking? Authorizing Provider  doxycycline (VIBRAMYCIN) 100 MG capsule Take 1 capsule (100 mg total) by mouth 2 (two) times daily. 03/09/23  Yes Sweet Jarvis, DO  doxycycline (VIBRAMYCIN) 100 MG capsule Take 1 capsule (100 mg total) by mouth 2 (two) times daily for 7 days. 03/09/23 03/16/23 Yes Kalyssa Anker, DO  albuterol (PROVENTIL) (2.5 MG/3ML) 0.083% nebulizer solution Take 3 mLs (2.5 mg total) by nebulization every 4 (four) hours as needed for wheezing or shortness of breath. 08/16/22   Lacinda Axon, MD  apixaban (ELIQUIS) 5 MG TABS tablet Take 1 tablet (5 mg total) by mouth 2 (two) times daily. 01/06/23   [name], MD  atorvastatin (LIPITOR) 40 MG tablet Take 1 tablet (40 mg total) by mouth daily. 01/03/23 01/03/24  [name], MD  budesonide (PULMICORT) 0.5 MG/2ML nebulizer solution NEW PRESCRIPTION REQEUST: BUDESONIDE 0.5 MG/ 2ML- USE ONE VIAL TWICE DAILY Patient taking differently: Take 0.5 mg by nebulization in the morning and at bedtime. 11/29/22   Freddi Starr, MD  folic acid (FOLVITE) 1 MG tablet Take 1 tablet (1 mg total) by mouth daily. 11/30/22   [name], MD  levETIRAcetam (KEPPRA) 500 MG tablet Take 1 tablet (500 mg total) by mouth 2 (two) times daily. 12/07/22   Frann Rider, NP  midodrine (PROAMATINE) 2.5 MG tablet Take 1 tablet (2.5 mg total) by mouth 3 (three) times daily with meals. 01/06/23 07/05/23  [name], MD  Multiple Vitamin (MULTIVITAMIN WITH MINERALS) TABS tablet Take 1 tablet by mouth daily. Patient taking differently: Take 1 tablet by mouth daily with breakfast. 09/20/22   Gaylan Gerold, DO  ondansetron (ZOFRAN) 4 MG tablet Take 4 mg by mouth every 8 (eight) hours as needed for nausea or vomiting.    [provider]  Tiotropium Bromide-Olodaterol (STIOLTO RESPIMAT) 2.5-2.5 MCG/ACT AERS NEW PRESCRIPTION REQUEST: STIOLTO 2.5 MCG- INHALE TWO PUFFS BY MOUTH DAILY Patient taking differently: Inhale 2 each into the lungs daily. 11/29/22   Freddi Starr, MD      Allergies    Patient has no known allergies.    Review of Systems   Review of Systems  Physical Exam Updated Vital Signs BP 101/60 (BP Location: Left Arm)   Pulse 84   Temp 98.5 F (36.9 C) (Oral)   Resp 17   Ht 6' (1.829 m)   Wt 74.4 kg   SpO2 100%   BMI 22.26 kg/m  Physical Exam Vitals and nursing note reviewed.  Constitutional:      General: He is not in acute distress.    Appearance: He is well-developed. He is not ill-appearing.  HENT:     Head: Normocephalic and  atraumatic.  Eyes:     Extraocular Movements: Extraocular movements intact.     Conjunctiva/sclera: Conjunctivae normal.     Pupils: Pupils are equal, round, and reactive to light.  Cardiovascular:     Rate and Rhythm: Normal rate and regular rhythm.     Pulses:          Radial pulses are 2+ on the right side and 2+ on the left side.     Heart sounds: Normal heart sounds. No murmur heard. Pulmonary:     Effort: Pulmonary effort is normal. No respiratory distress.     Breath sounds: Normal breath sounds. No decreased breath sounds.  Abdominal:     Palpations: Abdomen is soft.     Tenderness: There is no abdominal tenderness.  Musculoskeletal:        General: No swelling. Normal range  of motion.     Cervical back: Normal range of motion and neck supple.     Right lower leg: No edema.     Left lower leg: No edema.  Skin:    General: Skin is warm and dry.     Capillary Refill: Capillary refill takes less than 2 seconds.  Neurological:     General: No focal deficit present.     Mental Status: He is alert.     Cranial Nerves: No cranial nerve deficit.     Motor: No weakness.  Psychiatric:        Mood and Affect: Mood normal.     ED Results / Procedures / Treatments   Labs (all labs ordered are listed, but only abnormal results are displayed) Labs Reviewed  CBC WITH DIFFERENTIAL/PLATELET - Abnormal; Notable for the following components:      Result Value   RBC 3.76 (*)    Hemoglobin 11.4 (*)    HCT 36.2 (*)    All other components within normal limits  BASIC METABOLIC PANEL - Abnormal; Notable for the following components:   Glucose, Bld 107 (*)    All other components within normal limits  TROPONIN I (HIGH SENSITIVITY)    EKG EKG Interpretation  Date/Time:  Thursday March 09 2023 11:42:06 EDT Ventricular Rate:  92 PR Interval:  164 QRS Duration: 89 QT Interval:  382 QTC Calculation: 473 R Axis:   92 Text Interpretation: Sinus rhythm Right axis deviation Anteroseptal infarct, age indeterminate Confirmed by Lennice Sites ([id number]) on 03/09/2023 11:51:00 AM  Radiology CT Head Wo Contrast  Result Date: 03/09/2023 CLINICAL DATA:  Polytrauma, blunt. EXAM: CT HEAD WITHOUT CONTRAST TECHNIQUE: Contiguous axial images were obtained from the base of the skull through the vertex without intravenous contrast. RADIATION DOSE REDUCTION: This exam was performed according to the departmental dose-optimization program which includes automated exposure control, adjustment of the mA and/or kV according to patient size and/or use of iterative reconstruction technique. COMPARISON:  Head CT 01/29/2023. FINDINGS: Brain: No acute intracranial hemorrhage. Unchanged encephalomalacia  from prior large left MCA territory infarct. Hydrocephalus or extra-axial collection. No mass effect or midline shift. Vascular: No hyperdense vessel or unexpected calcification. Skull: No calvarial fracture or suspicious bone lesion. Skull base is unremarkable. Sinuses/Orbits: Right maxillary sinus atelectasis. Orbits are unremarkable. Other: Small right parietal scalp contusion. IMPRESSION: 1. No evidence of acute intracranial injury. 2. Unchanged encephalomalacia from prior large left MCA territory infarct. Electronically Signed   By: Emmit Alexanders M.D.   On: 03/09/2023 14:13   DG Pelvis Portable  Result Date: 03/09/2023 CLINICAL DATA:  Mechanical fall today onto LEFT side  EXAM: PORTABLE PELVIS 1-2 VIEWS COMPARISON:  Portable exam 1211 hours compared to 12/02/2022 FINDINGS: Osseous demineralization. Hip and SI joint spaces symmetric and preserved. No acute fracture, dislocation, or bone destruction. Scattered atherosclerotic calcifications. IMPRESSION: No acute osseous abnormalities. Electronically Signed   By: Lavonia Dana M.D.   On: 03/09/2023 12:55   DG Chest Portable 1 View  Result Date: 03/09/2023 CLINICAL DATA:  Chest pain EXAM: PORTABLE CHEST 1 VIEW COMPARISON:  01/29/2023 FINDINGS: Developing consolidative opacity in the left upper lobe. Acute infiltrate is possible. Recommend follow-up. No pneumothorax, effusion or edema. Normal cardiopericardial silhouette. Hyperinflation. Lordotic x-ray. Overlapping cardiac leads. Surgical clips overlying the right mid thorax. IMPRESSION: Developing left upper lobe consolidative opacity. Pneumonia or other infiltrate. Please correlate with clinical presentation. Recommend follow-up or additional workup when appropriate Electronically Signed   By: Jill Side M.D.   On: 03/09/2023 12:30    Procedures Procedures    Medications Ordered in ED Medications  doxycycline (VIBRA-TABS) tablet 100 mg (has no administration in time range)    ED Course/ Medical  Decision Making/ A&P                             Medical Decision Making Amount and/or Complexity of Data Reviewed Labs: ordered. Radiology: ordered.  Risk Prescription drug management.   [name] is here after fall off of bedside commode.  History of stroke on Eliquis.  History of lung cancer not undergoing treatment.  Some chest pain.  May be chest pain was happening before his fall.  History is limited due to speech issues with the patient but caregiver gives history.  He seems well.  Normal vitals.  Conservatively will check basic labs including CBC and BMP and troponin.  Will get chest x-ray, pelvic x-ray and head CT and neck CT.  No signs of trauma on exam.  Per my review and interpretation of labs no significant anemia or electrolyte abnormality or kidney injury.  Troponin normal.  EKG shows sinus rhythm per my review and interpretation.  No ischemic changes.  Head CT and neck CT per radiology reports unremarkable.  Chest x-ray may be with pneumonia and will treat.  Family updated on the plan.  He has been at his baseline.  Sounds like he has had some increased cough here recently.  11 follow-up with primary care doctor.  Discharged in good condition.  This chart was dictated using voice recognition software.  Despite best efforts to proofread,  errors can occur which can change the documentation meaning.         Final Clinical Impression(s) / ED Diagnoses Final diagnoses:  Community acquired pneumonia, unspecified laterality    Rx / DC Orders ED Discharge Orders          Ordered    doxycycline (VIBRAMYCIN) 100 MG capsule  2 times daily        03/09/23 1407    doxycycline (VIBRAMYCIN) 100 MG capsule  2 times daily        03/09/23 Milo, Burns, DO 03/09/23 1419

## 2023-03-09 NOTE — Discharge Instructions (Signed)
Take next dose antibiotic tomorrow.

## 2023-03-09 NOTE — ED Notes (Signed)
Livia Snellen called to provide transport for pt back home.

## 2023-03-09 NOTE — ED Triage Notes (Signed)
Mechanical fall off of BSC today. Pt fell on left side, denies injury, denies LOC. Pt caregiver states he hit the left side of his head, no injury noted. Pt states he has chest pain that started this AM, prior to fall. Chest pain is mid sternal, non radiating. Pt caregiver states pt is always SOB, no new SOB or increased issues.  C/o increase in cough. Active lung cancer, pt not on chemo or radiation at this time.

## 2023-03-21 ENCOUNTER — Other Ambulatory Visit: Payer: Self-pay | Admitting: *Deleted

## 2023-03-21 MED ORDER — FOLIC ACID 1 MG PO TABS: 1.0000 mg | ORAL_TABLET | Freq: Every day | ORAL | 2 refills | Status: DC

## 2023-03-22 ENCOUNTER — Emergency Department (HOSPITAL_COMMUNITY)
Admission: EM | Admit: 2023-03-22 | Discharge: 2023-03-22 | Disposition: A | Discharge status: Home / Self Care | Payer: Medicaid Other | Attending: Emergency Medicine | Admitting: Emergency Medicine

## 2023-03-22 ENCOUNTER — Emergency Department (HOSPITAL_COMMUNITY): Payer: Medicaid Other

## 2023-03-22 ENCOUNTER — Encounter (HOSPITAL_COMMUNITY): Payer: Self-pay

## 2023-03-22 DIAGNOSIS — G8929 Other chronic pain: Secondary | ICD-10-CM | POA: Insufficient documentation

## 2023-03-22 DIAGNOSIS — R079 Chest pain, unspecified: Secondary | ICD-10-CM | POA: Insufficient documentation

## 2023-03-22 DIAGNOSIS — Z7901 Long term (current) use of anticoagulants: Secondary | ICD-10-CM | POA: Insufficient documentation

## 2023-03-22 DIAGNOSIS — M5416 Radiculopathy, lumbar region: Secondary | ICD-10-CM

## 2023-03-22 DIAGNOSIS — M545 Low back pain, unspecified: Secondary | ICD-10-CM | POA: Diagnosis present

## 2023-03-22 LAB — CBC
HCT: 38.7 % — ABNORMAL LOW (ref 39.0–52.0)
Hemoglobin: 12.2 g/dL — ABNORMAL LOW (ref 13.0–17.0)
MCH: 30.1 pg (ref 26.0–34.0)
MCHC: 31.5 g/dL (ref 30.0–36.0)
MCV: 95.6 fL (ref 80.0–100.0)
Platelets: 241 10*3/uL (ref 150–400)
RBC: 4.05 MIL/uL — ABNORMAL LOW (ref 4.22–5.81)
RDW: 12.7 % (ref 11.5–15.5)
WBC: 7 10*3/uL (ref 4.0–10.5)
nRBC: 0 % (ref 0.0–0.2)

## 2023-03-22 LAB — COMPREHENSIVE METABOLIC PANEL
ALT: 17 U/L (ref 0–44)
AST: 18 U/L (ref 15–41)
Albumin: 3.8 g/dL (ref 3.5–5.0)
Alkaline Phosphatase: 104 U/L (ref 38–126)
Anion gap: 11 (ref 5–15)
BUN: 20 mg/dL (ref 8–23)
CO2: 31 mmol/L (ref 22–32)
Calcium: 9.3 mg/dL (ref 8.9–10.3)
Chloride: 100 mmol/L (ref 98–111)
Creatinine, Ser: 0.95 mg/dL (ref 0.61–1.24)
GFR, Estimated: >60 mL/min (ref >60)
Glucose, Bld: 101 mg/dL — ABNORMAL HIGH (ref 70–99)
Potassium: 3.8 mmol/L (ref 3.5–5.1)
Sodium: 142 mmol/L (ref 135–145)
Total Bilirubin: 0.5 mg/dL (ref 0.3–1.2)
Total Protein: 8.5 g/dL — ABNORMAL HIGH (ref 6.5–8.1)

## 2023-03-22 LAB — TROPONIN I (HIGH SENSITIVITY): Troponin I (High Sensitivity): 4 ng/L (ref <18)

## 2023-03-22 LAB — LIPASE, BLOOD: Lipase: 27 U/L (ref 11–51)

## 2023-03-22 MED ORDER — KETOROLAC TROMETHAMINE 30 MG/ML IJ SOLN
30.0000 mg | Freq: Once | INTRAMUSCULAR | Status: AC
  Administered 2023-03-22: 30 mg via INTRAVENOUS
  Filled 2023-03-22: qty 1

## 2023-03-22 NOTE — Discharge Instructions (Signed)
You are likely seen back pain due to a bulging disc or pinched nerve in your lower back.  This can cause pain to go down into your legs.  You can take Tylenol as needed at home for this.  We do not see any medical emergencies as a cause of your chest pain.  You have scar tissue in your lungs that we saw on CT scan.  We do not see any clear sign of a pneumonia or infection.  Your EKG and blood test for your heart were reassuring.  Please contact your primary care doctor's office.  These medical conditions should be managed by her primary care provider.

## 2023-03-22 NOTE — ED Provider Notes (Signed)
[place] EMERGENCY DEPARTMENT AT [facility] Provider Note   CSN: [id number] Arrival date & time: 03/22/23  1848     History {Add pertinent medical, surgical, social history, OB history to HPI:1} Chief Complaint  Patient presents with   Abdominal Pain    Todd Estrada is a 62 y.o. male presents to the ED with multiple complaints.  The patient reports primarily that he is having pain rating down his lower back into his lower legs.  He also ports his left-sided chest pain.  The patient unfortunately has some form of expressive aphasia or garbled speech where he is entirely unclear with his speech pattern but is able to answer questions yes or no appropriately.  He did check in with the visitor provided supplemental history of front, but the visitor is gone on my evaluation, and the patient is emergency contact, who is his brother, is not answering the phone.  Of note, the patient is on Eliquis, was seen in the ED for left-sided chest pain on 03/09/23  HPI     Home Medications Prior to Admission medications   Medication Sig Start Date End Date Taking? Authorizing Provider  albuterol (PROVENTIL) (2.5 MG/3ML) 0.083% nebulizer solution Take 3 mLs (2.5 mg total) by nebulization every 4 (four) hours as needed for wheezing or shortness of breath. 08/16/22   Lacinda Axon, MD  apixaban (ELIQUIS) 5 MG TABS tablet Take 1 tablet (5 mg total) by mouth 2 (two) times daily. 01/06/23   [name], MD  atorvastatin (LIPITOR) 40 MG tablet Take 1 tablet (40 mg total) by mouth daily. 01/03/23 01/03/24  [name], MD  budesonide (PULMICORT) 0.5 MG/2ML nebulizer solution NEW PRESCRIPTION REQEUST: BUDESONIDE 0.5 MG/ 2ML- USE ONE VIAL TWICE DAILY Patient taking differently: Take 0.5 mg by nebulization in the morning and at bedtime. 11/29/22   Freddi Starr, MD  doxycycline (VIBRAMYCIN) 100 MG capsule Take 1 capsule (100 mg total) by mouth 2 (two) times daily. 03/09/23   Curatolo, Adam,  DO  folic acid (FOLVITE) 1 MG tablet Take 1 tablet (1 mg total) by mouth daily. 03/21/23   [name], MD  levETIRAcetam (KEPPRA) 500 MG tablet Take 1 tablet (500 mg total) by mouth 2 (two) times daily. 12/07/22   Frann Rider, NP  midodrine (PROAMATINE) 2.5 MG tablet Take 1 tablet (2.5 mg total) by mouth 3 (three) times daily with meals. 01/06/23 07/05/23  [name], MD  Multiple Vitamin (MULTIVITAMIN WITH MINERALS) TABS tablet Take 1 tablet by mouth daily. Patient taking differently: Take 1 tablet by mouth daily with breakfast. 09/20/22   Gaylan Gerold, DO  ondansetron (ZOFRAN) 4 MG tablet Take 4 mg by mouth every 8 (eight) hours as needed for nausea or vomiting.    [provider]  Tiotropium Bromide-Olodaterol (STIOLTO RESPIMAT) 2.5-2.5 MCG/ACT AERS NEW PRESCRIPTION REQUEST: STIOLTO 2.5 MCG- INHALE TWO PUFFS BY MOUTH DAILY Patient taking differently: Inhale 2 each into the lungs daily. 11/29/22   Freddi Starr, MD      Allergies    Patient has no known allergies.    Review of Systems   Review of Systems  Physical Exam Updated Vital Signs BP (!) 147/74   Pulse 88   Temp 99.4 F (37.4 C) (Oral)   Resp 17   SpO2 98%  Physical Exam Constitutional:      General: He is not in acute distress. HENT:     Head: Normocephalic and atraumatic.  Eyes:     Conjunctiva/sclera:  Conjunctivae normal.     Pupils: Pupils are equal, round, and reactive to light.  Cardiovascular:     Rate and Rhythm: Normal rate and regular rhythm.  Pulmonary:     Effort: Pulmonary effort is normal. No respiratory distress.  Abdominal:     General: There is no distension.     Tenderness: There is no abdominal tenderness.  Skin:    General: Skin is warm and dry.  Neurological:     General: No focal deficit present.     Mental Status: He is alert and oriented to person, place, and time. Mental status is at baseline.     Cranial Nerves: No cranial nerve deficit.     Motor: No weakness.   Psychiatric:        Mood and Affect: Mood normal.        Behavior: Behavior normal.     ED Results / Procedures / Treatments   Labs (all labs ordered are listed, but only abnormal results are displayed) Labs Reviewed  CBC - Abnormal; Notable for the following components:      Result Value   RBC 4.05 (*)    Hemoglobin 12.2 (*)    HCT 38.7 (*)    All other components within normal limits  LIPASE, BLOOD  COMPREHENSIVE METABOLIC PANEL  URINALYSIS, ROUTINE W REFLEX MICROSCOPIC  TROPONIN I (HIGH SENSITIVITY)    EKG None  Radiology No results found.  Procedures Procedures  {Document cardiac monitor, telemetry assessment procedure when appropriate:1}  Medications Ordered in ED Medications  ketorolac (TORADOL) 30 MG/ML injection 30 mg (has no administration in time range)    ED Course/ Medical Decision Making/ A&P   {   Click here for ABCD2, HEART and other calculatorsREFRESH Note before signing :1}                          Medical Decision Making Amount and/or Complexity of Data Reviewed Labs: ordered. Radiology: ordered. ECG/medicine tests: ordered.  Risk Prescription drug management.   This patient presents to the Emergency Department with complaint of chest pain. This involves an extensive number of treatment options, and is a complaint that carries with it a high risk of complications and morbidity, given the patient's comorbidity.The differential diagnosis includes ACS vs Pneumothorax vs Reflux/Gastritis vs MSK pain vs Pneumonia vs other.  I felt PE was less likely given that patient is compliant on Eliquis, has no hypoxia or tachycardia or respiratory symptoms.  Patient is low back pain is highly consistent with lumbar radiculopathy.  There is no traumatic mechanism to suspect that he is got a spinal fracture at this time, no red flags for cauda equina syndrome.  He is able to walk and ambulate steadily.  No weakness of the lower extremities.  Will try some  Toradol to see if this improves his lower back pain; however we will not be able to give NSAIDs at home as he is on Eliquis.  I ordered, reviewed, and interpreted labs.  Pertinent results include *** I ordered imaging studies which included x-ray of the chest I independently visualized and interpreted imaging which showed *** and the monitor tracing which showed *** . I agree with the radiologist interpretation Additional history was obtained from *** External records obtained and reviewed showing *** I personally reviewed the patients ECG which showed sinus rhythm with no acute ischemic findings***  After the interventions stated above, I reevaluated the patient and found that they were ***  Based on the patient's clinical exam, vital signs, risk factors, and ED testing, I felt that the patient's overall risk of life-threatening emergency such as ACS, PE, sepsis, or infection was low.  At this time, I felt the patient's presentation was most clinically consistent with ***, but explained to the patient that this evaluation was not a definitive diagnostic workup.  I discussed outpatient follow up with primary care provider, and provided specialist office number on the patient's discharge paper if a referral was deemed necessary.  Return precautions were discussed with the patient.  I felt the patient was clinically stable for discharge.   {Document critical care time when appropriate:1} {Document review of labs and clinical decision tools ie heart score, Chads2Vasc2 etc:1}  {Document your independent review of radiology images, and any outside records:1} {Document your discussion with family members, caretakers, and with consultants:1} {Document social determinants of health affecting pt's care:1} {Document your decision making why or why not admission, treatments were needed:1} Final Clinical Impression(s) / ED Diagnoses Final diagnoses:  None    Rx / DC Orders ED Discharge Orders      None

## 2023-03-22 NOTE — ED Triage Notes (Signed)
Pt presents with c/o abdominal pain. Visitor at bedside reports that he started complaining of pain today after eating. Pt reports he is also having pain going down both of his legs.

## 2023-03-30 ENCOUNTER — Encounter (HOSPITAL_COMMUNITY): Payer: Self-pay

## 2023-03-30 ENCOUNTER — Emergency Department (HOSPITAL_COMMUNITY)
Admission: EM | Admit: 2023-03-30 | Discharge: 2023-03-30 | Disposition: A | Discharge status: Home / Self Care | Payer: Medicaid Other | Attending: Emergency Medicine | Admitting: Emergency Medicine

## 2023-03-30 ENCOUNTER — Emergency Department (HOSPITAL_COMMUNITY): Payer: Medicaid Other

## 2023-03-30 DIAGNOSIS — J449 Chronic obstructive pulmonary disease, unspecified: Secondary | ICD-10-CM | POA: Diagnosis not present

## 2023-03-30 DIAGNOSIS — Z7901 Long term (current) use of anticoagulants: Secondary | ICD-10-CM | POA: Diagnosis not present

## 2023-03-30 DIAGNOSIS — R0781 Pleurodynia: Secondary | ICD-10-CM | POA: Diagnosis present

## 2023-03-30 DIAGNOSIS — Z85118 Personal history of other malignant neoplasm of bronchus and lung: Secondary | ICD-10-CM | POA: Insufficient documentation

## 2023-03-30 LAB — COMPREHENSIVE METABOLIC PANEL
ALT: 12 U/L (ref 0–44)
AST: 14 U/L — ABNORMAL LOW (ref 15–41)
Albumin: 3.4 g/dL — ABNORMAL LOW (ref 3.5–5.0)
Alkaline Phosphatase: 90 U/L (ref 38–126)
Anion gap: 8 (ref 5–15)
BUN: 18 mg/dL (ref 8–23)
CO2: 30 mmol/L (ref 22–32)
Calcium: 9.2 mg/dL (ref 8.9–10.3)
Chloride: 100 mmol/L (ref 98–111)
Creatinine, Ser: 0.9 mg/dL (ref 0.61–1.24)
GFR, Estimated: >60 mL/min (ref >60)
Glucose, Bld: 93 mg/dL (ref 70–99)
Potassium: 4.3 mmol/L (ref 3.5–5.1)
Sodium: 138 mmol/L (ref 135–145)
Total Bilirubin: 0.2 mg/dL — ABNORMAL LOW (ref 0.3–1.2)
Total Protein: 7.7 g/dL (ref 6.5–8.1)

## 2023-03-30 LAB — CBC WITH DIFFERENTIAL/PLATELET
Abs Immature Granulocytes: 0.02 10*3/uL (ref 0.00–0.07)
Basophils Absolute: 0 10*3/uL (ref 0.0–0.1)
Basophils Relative: 0 %
Eosinophils Absolute: 0.1 10*3/uL (ref 0.0–0.5)
Eosinophils Relative: 2 %
HCT: 37.2 % — ABNORMAL LOW (ref 39.0–52.0)
Hemoglobin: 11.4 g/dL — ABNORMAL LOW (ref 13.0–17.0)
Immature Granulocytes: 0 %
Lymphocytes Relative: 13 %
Lymphs Abs: 0.9 10*3/uL (ref 0.7–4.0)
MCH: 29.2 pg (ref 26.0–34.0)
MCHC: 30.6 g/dL (ref 30.0–36.0)
MCV: 95.1 fL (ref 80.0–100.0)
Monocytes Absolute: 0.5 10*3/uL (ref 0.1–1.0)
Monocytes Relative: 8 %
Neutro Abs: 5.2 10*3/uL (ref 1.7–7.7)
Neutrophils Relative %: 77 %
Platelets: 257 10*3/uL (ref 150–400)
RBC: 3.91 MIL/uL — ABNORMAL LOW (ref 4.22–5.81)
RDW: 12.5 % (ref 11.5–15.5)
WBC: 6.7 10*3/uL (ref 4.0–10.5)
nRBC: 0 % (ref 0.0–0.2)

## 2023-03-30 LAB — LIPASE, BLOOD: Lipase: 27 U/L (ref 11–51)

## 2023-03-30 MED ORDER — SODIUM CHLORIDE 0.9 % IV BOLUS
500.0000 mL | Freq: Once | INTRAVENOUS | Status: AC
  Administered 2023-03-30: 500 mL via INTRAVENOUS

## 2023-03-30 MED ORDER — KETOROLAC TROMETHAMINE 15 MG/ML IJ SOLN
15.0000 mg | Freq: Once | INTRAMUSCULAR | Status: AC
  Administered 2023-03-30: 15 mg via INTRAVENOUS
  Filled 2023-03-30: qty 1

## 2023-03-30 NOTE — ED Notes (Signed)
Pts cousin verbalized understanding of d/c instructions and follow up care.

## 2023-03-30 NOTE — ED Provider Notes (Signed)
[place] [facility] Provider [facility]   CSN: [id number] Arrival date & time: 03/30/23  1617     History  Chief Complaint  Patient presents with   Abdominal Pain    Todd Estrada is a 62 y.o. male.  62 year old male with prior medical history as detailed below presents for evaluation.  Patient complains of right-sided rib pain.  Patient ability to provide in-depth history is limited.  Patient with form of expressive aphasia.  Patient is able to answer simple questions with yes and no.  He is able to localize his pain to the right lateral ribs.  It appears that his pain began earlier today.  He denies any specific injury.  He denies associated shortness of breath, nausea, vomiting, other complaint.  He did not take anything for his pain earlier today.  He is reporting compliance with previously prescribed medications including Eliquis.  The history is provided by the patient and medical records.       Home Medications Prior to Admission medications   Medication Sig Start Date End Date Taking? Authorizing Provider  albuterol (PROVENTIL) (2.5 MG/3ML) 0.083% nebulizer solution Take 3 mLs (2.5 mg total) by nebulization every 4 (four) hours as needed for wheezing or shortness of breath. 08/16/22   Steffanie RainwaterAmponsah, Prosper M, MD  apixaban (ELIQUIS) 5 MG TABS tablet Take 1 tablet (5 mg total) by mouth 2 (two) times daily. 01/06/23   Merrilyn PumaJinwala, Sagar, MD  atorvastatin (LIPITOR) 40 MG tablet Take 1 tablet (40 mg total) by mouth daily. 01/03/23 01/03/24  Merrilyn PumaJinwala, Sagar, MD  budesonide (PULMICORT) 0.5 MG/2ML nebulizer solution NEW PRESCRIPTION REQEUST: BUDESONIDE 0.5 MG/ 2ML- USE ONE VIAL TWICE DAILY Patient taking differently: Take 0.5 mg by nebulization in the morning and at bedtime. 11/29/22   Martina Sinnerewald, Jonathan B, MD  doxycycline (VIBRAMYCIN) 100 MG capsule Take 1 capsule (100 mg total) by mouth 2 (two) times daily. 03/09/23   Curatolo, Adam, DO  folic acid (FOLVITE) 1  MG tablet Take 1 tablet (1 mg total) by mouth daily. 03/21/23   Merrilyn PumaJinwala, Sagar, MD  levETIRAcetam (KEPPRA) 500 MG tablet Take 1 tablet (500 mg total) by mouth 2 (two) times daily. 12/07/22   Ihor AustinMcCue, Jessica, NP  midodrine (PROAMATINE) 2.5 MG tablet Take 1 tablet (2.5 mg total) by mouth 3 (three) times daily with meals. 01/06/23 07/05/23  Merrilyn PumaJinwala, Sagar, MD  Multiple Vitamin (MULTIVITAMIN WITH MINERALS) TABS tablet Take 1 tablet by mouth daily. Patient taking differently: Take 1 tablet by mouth daily with breakfast. 09/20/22   Doran StablerNguyen, Quan, DO  ondansetron (ZOFRAN) 4 MG tablet Take 4 mg by mouth every 8 (eight) hours as needed for nausea or vomiting.    [provider]  Tiotropium Bromide-Olodaterol (STIOLTO RESPIMAT) 2.5-2.5 MCG/ACT AERS NEW PRESCRIPTION REQUEST: STIOLTO 2.5 MCG- INHALE TWO PUFFS BY MOUTH DAILY Patient taking differently: Inhale 2 each into the lungs daily. 11/29/22   Martina Sinnerewald, Jonathan B, MD      Allergies    Patient has no known allergies.    Review of Systems   Review of Systems  All other systems reviewed and are negative.   Physical Exam Updated Vital Signs BP 94/72   Pulse 87   Temp 98.7 F (37.1 C) (Oral)   Resp 16   Ht 6' (1.829 m)   Wt 74.8 kg   SpO2 98%   BMI 22.38 kg/m  Physical Exam Vitals and nursing note reviewed.  Constitutional:      General: He is  not in acute distress.    Appearance: Normal appearance. He is well-developed.  HENT:     Head: Normocephalic and atraumatic.  Eyes:     Conjunctiva/sclera: Conjunctivae normal.     Pupils: Pupils are equal, round, and reactive to light.  Cardiovascular:     Rate and Rhythm: Normal rate and regular rhythm.     Heart sounds: Normal heart sounds.  Pulmonary:     Effort: Pulmonary effort is normal. No respiratory distress.     Breath sounds: Normal breath sounds.     Comments: Localizes pain to right inferior lateral ribs.  No erythema, bony step-off, crepitus, ecchymosis appreciated on exam.   No rash noted. Abdominal:     General: There is no distension.     Palpations: Abdomen is soft.     Tenderness: There is no abdominal tenderness.  Musculoskeletal:        General: No deformity. Normal range of motion.     Cervical back: Normal range of motion and neck supple.  Skin:    General: Skin is warm and dry.  Neurological:     General: No focal deficit present.     Mental Status: He is alert and oriented to person, place, and time.     ED Results / Procedures / Treatments   Labs (all labs ordered are listed, but only abnormal results are displayed) Labs Reviewed  CBC WITH DIFFERENTIAL/PLATELET - Abnormal; Notable for the following components:      Result Value   RBC 3.91 (*)    Hemoglobin 11.4 (*)    HCT 37.2 (*)    All other components within normal limits  COMPREHENSIVE METABOLIC PANEL - Abnormal; Notable for the following components:   Albumin 3.4 (*)    AST 14 (*)    Total Bilirubin 0.2 (*)    All other components within normal limits  LIPASE, BLOOD    EKG EKG Interpretation  Date/Time:  Thursday March 30 2023 17:24:02 EDT Ventricular Rate:  70 PR Interval:  175 QRS Duration: 89 QT Interval:  384 QTC Calculation: 415 R Axis:   78 Text Interpretation: Sinus rhythm Probable anteroseptal infarct, old Confirmed by Kristine Royal [phone]) on 03/30/2023 5:26:54 PM  Radiology DG Ribs Unilateral W/Chest Right  Result Date: 03/30/2023 CLINICAL DATA:  History of lung cancer and COPD with right anterior upper rib pain EXAM: RIGHT RIBS AND CHEST - 2 VIEW COMPARISON:  Chest radiograph dated 03/22/2023 FINDINGS: No fracture or other bone lesions are seen involving the ribs. Metallic BB overlies an area of right mid lung opacity/scarring. Similar appearance of bilateral mid lung linear opacity/scarring. There is no evidence of pneumothorax or pleural effusion. Heart size and mediastinal contours are within normal limits. IMPRESSION: 1. No evidence of displaced rib  fracture. 2. Metallic BB overlies an area of right mid lung opacity/scarring. Similar appearance of bilateral mid lung linear opacity/scarring. Electronically Signed   By: Agustin Cree M.D.   On: 03/30/2023 17:33    Procedures Procedures    Medications Ordered in ED Medications  ketorolac (TORADOL) 15 MG/ML injection 15 mg (has no administration in time range)  sodium chloride 0.9 % bolus 500 mL (has no administration in time range)    ED Course/ Medical Decision Making/ A&P                             Medical Decision Making Amount and/or Complexity of Data Reviewed Labs: ordered. Radiology: ordered.  Risk Prescription drug management.    Medical Screen Complete  This patient presented to the ED with complaint of right-sided rib pain.  This complaint involves an extensive number of treatment options. The initial differential diagnosis includes, but is not limited to, pneumonia, pneumothorax, rib fracture, contusion, musculoskeletal pain, metabolic abnormality, etc.  This presentation is: Acute, Self-Limited, Previously Undiagnosed, Uncertain Prognosis, Complicated, Systemic Symptoms, and Threat to Life/Bodily Function  Patient is presenting with complaint of right-sided rib pain.  Patient's ability to provide a decent history is limited given his history of expressive aphasia.  Patient's workup is without significant acute abnormality.  Patient feels significantly improved after Toradol.  He is requesting discharge home.  He understands need for close outpatient follow-up.  Strict return precautions given and understood.  Additional history obtained: External records from outside sources obtained and reviewed including prior ED visits and prior Inpatient records.    Lab Tests:  I ordered and personally interpreted labs.  The pertinent results include: CBC, CMP, lipase   Imaging Studies ordered:  I ordered imaging studies including plain films of right ribs I  independently visualized and interpreted obtained imaging which showed NAD I agree with the radiologist interpretation.   Cardiac Monitoring:  The patient was maintained on a cardiac monitor.  I personally viewed and interpreted the cardiac monitor which showed an underlying rhythm of: NSR   Medicines ordered:  I ordered medication including Toradol for pain Reevaluation of the patient after these medicines showed that the patient: improved   Problem List / ED Course:  Right rib pain   Reevaluation:  After the interventions noted above, I reevaluated the patient and found that they have: improved  Disposition:  After consideration of the diagnostic results and the patients response to treatment, I feel that the patent would benefit from close outpatient follow-up.          Final Clinical Impression(s) / ED Diagnoses Final diagnoses:  Rib pain    Rx / DC Orders ED Discharge Orders     None         Wynetta Fines, MD 03/30/23 Windell Moment

## 2023-03-30 NOTE — ED Notes (Signed)
Pts cousin reports he will be here to pick up the patient around 42

## 2023-03-30 NOTE — Discharge Instructions (Signed)
Return for any problem.  ?

## 2023-03-30 NOTE — ED Triage Notes (Signed)
Pt c/o right sided rib pain/upper abd pain. Pt has hx of lung cancer.

## 2023-04-03 ENCOUNTER — Encounter (HOSPITAL_COMMUNITY): Payer: Self-pay

## 2023-04-03 ENCOUNTER — Emergency Department (HOSPITAL_COMMUNITY)
Admission: EM | Admit: 2023-04-03 | Discharge: 2023-04-03 | Disposition: A | Discharge status: Home / Self Care | Payer: Medicaid Other | Attending: Emergency Medicine | Admitting: Emergency Medicine

## 2023-04-03 DIAGNOSIS — Z7901 Long term (current) use of anticoagulants: Secondary | ICD-10-CM | POA: Insufficient documentation

## 2023-04-03 DIAGNOSIS — L0231 Cutaneous abscess of buttock: Secondary | ICD-10-CM | POA: Diagnosis present

## 2023-04-03 MED ORDER — LIDOCAINE-EPINEPHRINE (PF) 2 %-1:200000 IJ SOLN
10.0000 mL | Freq: Once | INTRAMUSCULAR | Status: AC
  Administered 2023-04-03: 10 mL
  Filled 2023-04-03: qty 20

## 2023-04-03 MED ORDER — DOXYCYCLINE HYCLATE 100 MG PO CAPS: 100.0000 mg | ORAL_CAPSULE | Freq: Two times a day (BID) | ORAL | 0 refills | Status: DC

## 2023-04-03 NOTE — ED Provider Notes (Cosign Needed Addendum)
[place] EMERGENCY DEPARTMENT AT [facility] Provider Note   CSN: [id number] Arrival date & time: 04/03/23  1829     History  Chief Complaint  Patient presents with   Abscess    Todd Estrada is a 62 y.o. male with past medical history as outlined below presents to the ED complaining of an abscess to his right buttock.  Patient is wheelchair bound and has had a right sided infected cyst on the same buttock before, which was treated and was healing per last internal medicine note.  The abscess has been there for 2 weeks.  It is painful for him.  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        Home Medications Prior to Admission medications   Medication Sig Start Date End Date Taking? Authorizing Provider  doxycycline (VIBRAMYCIN) 100 MG capsule Take 1 capsule (100 mg total) by mouth 2 (two) times daily. 04/03/23  Yes Safiyya Stokes R, PA-C  albuterol (PROVENTIL) (2.5 MG/3ML) 0.083% nebulizer solution Take 3 mLs (2.5 mg total) by nebulization every 4 (four) hours as needed for wheezing or shortness of breath. 08/16/22   Steffanie Rainwater, MD  apixaban (ELIQUIS) 5 MG TABS tablet Take 1 tablet (5 mg total) by mouth 2 (two) times daily. 01/06/23   Merrilyn Puma, MD  atorvastatin (LIPITOR) 40 MG tablet Take 1 tablet (40 mg total) by mouth daily. 01/03/23 01/03/24  Merrilyn Puma, MD  budesonide (PULMICORT) 0.5 MG/2ML nebulizer solution NEW PRESCRIPTION REQEUST: BUDESONIDE 0.5 MG/ - USE ONE VIAL TWICE DAILY Patient taking differently: Take 0.5 mg by nebulization in the morning and at bedtime. 11/29/22   Martina Sinner, MD  folic acid (FOLVITE) 1 MG tablet Take 1 tablet (1 mg total) by mouth daily. 03/21/23   Merrilyn Puma, MD  levETIRAcetam (KEPPRA) 500 MG tablet Take 1 tablet (500 mg total) by mouth 2 (two) times daily. 12/07/22   Ihor Austin, NP  midodrine (PROAMATINE) 2.5 MG tablet Take 1 tablet (2.5 mg total) by mouth 3 (three) times daily  with meals. 01/06/23 07/05/23  Merrilyn Puma, MD  Multiple Vitamin (MULTIVITAMIN WITH MINERALS) TABS tablet Take 1 tablet by mouth daily. Patient taking differently: Take 1 tablet by mouth daily with breakfast. 09/20/22   Doran Stabler, DO  ondansetron (ZOFRAN) 4 MG tablet Take 4 mg by mouth every 8 (eight) hours as needed for nausea or vomiting.    [provider]  Tiotropium Bromide-Olodaterol (STIOLTO RESPIMAT) 2.5-2.5 MCG/ACT AERS NEW PRESCRIPTION REQUEST: STIOLTO 2.5 MCG- INHALE TWO PUFFS BY MOUTH DAILY Patient taking differently: Inhale 2 each into the lungs daily. 11/29/22   Martina Sinner, MD      Allergies    Patient has no known allergies.    Review of Systems   Review of Systems  Reason unable to perform ROS: Aphasia.  Skin:        Right buttock abscess    Physical Exam Updated Vital Signs BP (!) 140/84   Pulse 76   Temp 98.1 F (36.7 C) (Oral)   Resp 16   Ht 6' (1.829 m)   Wt 74.8 kg   SpO2 98%   BMI 22.38 kg/m  Physical Exam Vitals and nursing note reviewed.  Constitutional:      General: He is not in acute distress.    Comments: Chronically ill appearing, elderly male, lying left lateral recumbent.   Pulmonary:     Effort: Pulmonary effort is normal.  Skin:  Findings: Abscess present.       Neurological:     Mental Status: He is alert. Mental status is at baseline.  Psychiatric:        Mood and Affect: Mood normal.        Behavior: Behavior normal.     ED Results / Procedures / Treatments   Labs (all labs ordered are listed, but only abnormal results are displayed) Labs Reviewed - No data to display  EKG None  Radiology No results found.  Procedures .Marland KitchenIncision and Drainage  Date/Time: 04/03/2023 8:42 PM  Performed by: Lenard Simmer, PA-C Authorized by: Lenard Simmer, PA-C   Consent:    Consent obtained:  Verbal   Consent given by:  Patient   Risks discussed:  Bleeding, incomplete drainage, pain and damage to other  organs   Alternatives discussed:  No treatment Universal protocol:    Procedure explained and questions answered to patient or proxy's satisfaction: yes     Relevant documents present and verified: yes     Test results available : yes     Imaging studies available: yes     Required blood products, implants, devices, and special equipment available: yes     Site/side marked: yes     Immediately prior to procedure, a time out was called: yes     Patient identity confirmed:  Verbally with patient Location:    Type:  Abscess   Size:  3 cm   Location:  Lower extremity   Lower extremity location:  Buttock   Buttock location:  R buttock Pre-procedure details:    Skin preparation:  Chlorhexidine with alcohol Anesthesia:    Anesthesia method:  Local infiltration   Local anesthetic:  Lidocaine 2% WITH epi Procedure type:    Complexity:  Simple Procedure details:    Incision types:  Single straight   Incision depth:  Dermal   Wound management:  Probed and deloculated and irrigated with saline   Drainage:  Purulent   Drainage amount:  Copious   Wound treatment:  Wound left open   Packing materials:  1/4 in gauze   Amount 1/4":  6 inches Post-procedure details:    Procedure completion:  Tolerated well, no immediate complications     Medications Ordered in ED Medications  lidocaine-EPINEPHrine (XYLOCAINE W/EPI) 2 %-1:200000 (PF) injection 10 mL (10 mLs Infiltration Given 04/03/23 2015)    ED Course/ Medical Decision Making/ A&P                             Medical Decision Making Risk Prescription drug management.   This patient presents to the ED with chief complaint(s) of abscess to right buttock with pertinent past medical history of previous abscess, wheelchair bound.  The complaint involves an extensive differential diagnosis and also carries with it a high risk of complications and morbidity.    Additional history obtained: Records reviewed Primary Care Documents, patient  was seen for similar abscess that was healing at time of visit.  It was discussed at this visit that patient should try to change positions to avoid wound recurrence.    Initial Assessment:   Exam significant for a tender, 3 cm fluctuant mass consistent with an abscess to the superior right buttock.  There is mild surrounding erythema.  No current drainage or bleeding.    Treatment and Reassessment: I&D of abscess was performed.  See procedure section for more detail.  Patient tolerated procedure  well.   Disposition:   Will send patient home on doxycycline and have him follow up outpatient with PCP.  Abscess was packed with iodoform gauze which will fall out on its own.  Patient given information regarding after care of abscess.    The patient has been appropriately medically screened and/or stabilized in the ED. I have low suspicion for any other emergent medical condition which would require further screening, evaluation or treatment in the ED or require inpatient management. At time of discharge the patient is hemodynamically stable and in no acute distress. I have discussed work-up results and diagnosis with patient and answered all questions. Patient is agreeable with discharge plan. We discussed strict return precautions for returning to the emergency department and they verbalized understanding.            Final Clinical Impression(s) / ED Diagnoses Final diagnoses:  Abscess of buttock, right    Rx / DC Orders ED Discharge Orders          Ordered    doxycycline (VIBRAMYCIN) 100 MG capsule  2 times daily        04/03/23 2041              Lenard Simmer, PA-C 04/03/23 2042    Lenard Simmer, PA-C 04/03/23 2044    Bethann Berkshire, MD 04/05/23 1129

## 2023-04-03 NOTE — ED Triage Notes (Signed)
Pt returns today c/o right "butt cheek" abscess. Pt did not mention it when seen on 4/11.

## 2023-04-03 NOTE — Discharge Instructions (Addendum)
Thank you for allowing me to be a part of your care today.   You had an abscess on your buttock that was drained and packed with gauze.  This will fall out on its own in a few days.   I have sent over an antibiotic to the pharmacy.  Please begin taking this as soon as possible.   Please follow up with your primary care provider.  I have also provided information for the [facility] and [facility] if you do not have a primary care.

## 2023-04-08 ENCOUNTER — Other Ambulatory Visit: Payer: Self-pay

## 2023-04-08 ENCOUNTER — Emergency Department (HOSPITAL_COMMUNITY): Payer: Medicaid Other

## 2023-04-08 ENCOUNTER — Encounter (HOSPITAL_COMMUNITY): Payer: Self-pay | Admitting: Emergency Medicine

## 2023-04-08 ENCOUNTER — Emergency Department (HOSPITAL_COMMUNITY)
Admission: EM | Admit: 2023-04-08 | Discharge: 2023-04-08 | Disposition: A | Discharge status: Home / Self Care | Payer: Medicaid Other | Attending: Emergency Medicine | Admitting: Emergency Medicine

## 2023-04-08 DIAGNOSIS — R079 Chest pain, unspecified: Secondary | ICD-10-CM | POA: Diagnosis present

## 2023-04-08 DIAGNOSIS — Z7901 Long term (current) use of anticoagulants: Secondary | ICD-10-CM | POA: Insufficient documentation

## 2023-04-08 DIAGNOSIS — J449 Chronic obstructive pulmonary disease, unspecified: Secondary | ICD-10-CM | POA: Diagnosis not present

## 2023-04-08 DIAGNOSIS — Z87891 Personal history of nicotine dependence: Secondary | ICD-10-CM | POA: Diagnosis not present

## 2023-04-08 DIAGNOSIS — Z85118 Personal history of other malignant neoplasm of bronchus and lung: Secondary | ICD-10-CM | POA: Insufficient documentation

## 2023-04-08 DIAGNOSIS — I509 Heart failure, unspecified: Secondary | ICD-10-CM | POA: Diagnosis not present

## 2023-04-08 DIAGNOSIS — R0602 Shortness of breath: Secondary | ICD-10-CM | POA: Diagnosis not present

## 2023-04-08 DIAGNOSIS — I110 Hypertensive heart disease with heart failure: Secondary | ICD-10-CM | POA: Diagnosis not present

## 2023-04-08 DIAGNOSIS — R109 Unspecified abdominal pain: Secondary | ICD-10-CM | POA: Diagnosis not present

## 2023-04-08 DIAGNOSIS — Z8673 Personal history of transient ischemic attack (TIA), and cerebral infarction without residual deficits: Secondary | ICD-10-CM | POA: Diagnosis not present

## 2023-04-08 LAB — TROPONIN I (HIGH SENSITIVITY)
Troponin I (High Sensitivity): 3 ng/L (ref <18)
Troponin I (High Sensitivity): 4 ng/L (ref <18)

## 2023-04-08 LAB — CBC WITH DIFFERENTIAL/PLATELET
Abs Immature Granulocytes: 0.01 10*3/uL (ref 0.00–0.07)
Basophils Absolute: 0 10*3/uL (ref 0.0–0.1)
Basophils Relative: 0 %
Eosinophils Absolute: 0.1 10*3/uL (ref 0.0–0.5)
Eosinophils Relative: 2 %
HCT: 39.7 % (ref 39.0–52.0)
Hemoglobin: 12.3 g/dL — ABNORMAL LOW (ref 13.0–17.0)
Immature Granulocytes: 0 %
Lymphocytes Relative: 8 %
Lymphs Abs: 0.5 10*3/uL — ABNORMAL LOW (ref 0.7–4.0)
MCH: 29.4 pg (ref 26.0–34.0)
MCHC: 31 g/dL (ref 30.0–36.0)
MCV: 95 fL (ref 80.0–100.0)
Monocytes Absolute: 0.6 10*3/uL (ref 0.1–1.0)
Monocytes Relative: 9 %
Neutro Abs: 4.9 10*3/uL (ref 1.7–7.7)
Neutrophils Relative %: 81 %
Platelets: 184 10*3/uL (ref 150–400)
RBC: 4.18 MIL/uL — ABNORMAL LOW (ref 4.22–5.81)
RDW: 13 % (ref 11.5–15.5)
WBC: 6.1 10*3/uL (ref 4.0–10.5)
nRBC: 0 % (ref 0.0–0.2)

## 2023-04-08 LAB — BASIC METABOLIC PANEL
Anion gap: 8 (ref 5–15)
BUN: 17 mg/dL (ref 8–23)
CO2: 30 mmol/L (ref 22–32)
Calcium: 9.4 mg/dL (ref 8.9–10.3)
Chloride: 101 mmol/L (ref 98–111)
Creatinine, Ser: 0.84 mg/dL (ref 0.61–1.24)
GFR, Estimated: >60 mL/min (ref >60)
Glucose, Bld: 100 mg/dL — ABNORMAL HIGH (ref 70–99)
Potassium: 3.9 mmol/L (ref 3.5–5.1)
Sodium: 139 mmol/L (ref 135–145)

## 2023-04-08 LAB — URINALYSIS, ROUTINE W REFLEX MICROSCOPIC
Bilirubin Urine: NEGATIVE
Glucose, UA: NEGATIVE mg/dL
Hgb urine dipstick: NEGATIVE
Ketones, ur: NEGATIVE mg/dL
Leukocytes,Ua: NEGATIVE
Nitrite: NEGATIVE
Protein, ur: NEGATIVE mg/dL
Specific Gravity, Urine: 1.012 (ref 1.005–1.030)
pH: 6 (ref 5.0–8.0)

## 2023-04-08 LAB — BRAIN NATRIURETIC PEPTIDE: B Natriuretic Peptide: 24.1 pg/mL (ref 0.0–100.0)

## 2023-04-08 MED ORDER — IOHEXOL 350 MG/ML SOLN
100.0000 mL | Freq: Once | INTRAVENOUS | Status: AC | PRN
  Administered 2023-04-08: 100 mL via INTRAVENOUS

## 2023-04-08 MED ORDER — IPRATROPIUM-ALBUTEROL 0.5-2.5 (3) MG/3ML IN SOLN
3.0000 mL | Freq: Once | RESPIRATORY_TRACT | Status: AC
  Administered 2023-04-08: 3 mL via RESPIRATORY_TRACT
  Filled 2023-04-08: qty 3

## 2023-04-08 NOTE — ED Triage Notes (Signed)
C/o centralized chest pain with sob that woke patient from sleep 1hr PTA.  Denies radiation  Denies n/v.

## 2023-04-08 NOTE — ED Provider Notes (Signed)
[place] EMERGENCY DEPARTMENT AT [facility] Provider Note   CSN: [id number] Arrival date & time: 04/08/23  1041     History  Chief Complaint  Patient presents with   Chest Pain    Todd Estrada is a 62 y.o. male.   Chest Pain   62 year old male presents emergency department with complaint of chest pain.  Patient difficult historian secondary to aphasia from prior stroke.  Cousin able to give more detailed history.  Cousin states the patient awoke this morning with complaints of left-sided chest pain as well as left-sided flank pain.  States the patient has history of stroke with residual symptoms of aphasia and right-sided weakness.  Denies any change from patient's baseline.  Unable to get narrative from patient given that he mumbles at baseline but able to respond appropriately to yes or no.  Does report some left-sided chest pain as well as shortness of breath.  States he has been coughing more frequently.  Denies fever, abdominal pain, nausea, vomiting, urinary symptoms, change in bowel habits.  Patient also states he has left flank area; unable to determine whether it radiates anywhere.  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 Authorizing Provider  albuterol (PROVENTIL) (2.5 MG/3ML) 0.083% nebulizer solution Take 3 mLs (2.5 mg total) by nebulization every 4 (four) hours as needed for wheezing or shortness of breath. 08/16/22   Steffanie Rainwater, MD  apixaban (ELIQUIS) 5 MG TABS tablet Take 1 tablet (5 mg total) by mouth 2 (two) times daily. 01/06/23   Merrilyn Puma, MD  atorvastatin (LIPITOR) 40 MG tablet Take 1 tablet (40 mg total) by mouth daily. 01/03/23 01/03/24  Merrilyn Puma, MD   budesonide (PULMICORT) 0.5 MG/2ML nebulizer solution NEW PRESCRIPTION REQEUST: BUDESONIDE 0.5 MG/ - USE ONE VIAL TWICE DAILY Patient taking differently: Take 0.5 mg by nebulization in the morning and at bedtime. 11/29/22   Martina Sinner, MD  doxycycline (VIBRAMYCIN) 100 MG capsule Take 1 capsule (100 mg total) by mouth 2 (two) times daily. 04/03/23   Clark, Meghan R, PA-C  folic acid (FOLVITE) 1 MG tablet Take 1 tablet (1 mg total) by mouth daily. 03/21/23   Merrilyn Puma, MD  levETIRAcetam (KEPPRA) 500 MG tablet Take 1 tablet (500 mg total) by mouth 2 (two) times daily. 12/07/22   Ihor Austin, NP  midodrine (PROAMATINE) 2.5 MG tablet Take 1 tablet (2.5 mg total) by mouth 3 (three) times daily with meals. 01/06/23 07/05/23  Merrilyn Puma, MD  Multiple Vitamin (MULTIVITAMIN WITH MINERALS) TABS tablet Take 1 tablet by mouth daily. Patient taking differently: Take 1 tablet by mouth daily with breakfast. 09/20/22   Doran Stabler, DO  ondansetron (ZOFRAN) 4 MG tablet Take 4 mg by mouth every 8 (eight) hours as needed for nausea or vomiting.    [provider]  Tiotropium Bromide-Olodaterol (STIOLTO RESPIMAT) 2.5-2.5 MCG/ACT AERS NEW PRESCRIPTION REQUEST: STIOLTO 2.5 MCG- INHALE TWO PUFFS BY MOUTH DAILY Patient taking differently: Inhale 2 each into the lungs daily. 11/29/22   Martina Sinner, MD      Allergies    Patient has no known allergies.    Review of Systems   Review of Systems  Cardiovascular:  Positive for chest pain.  All other systems reviewed and are  negative.   Physical Exam Updated Vital Signs BP 124/79   Pulse 93   Temp 99.1 F (37.3 C) (Oral)   Resp 14   SpO2 93%  Physical Exam Vitals and nursing note reviewed.  Constitutional:      General: He is not in acute distress.    Appearance: He is well-developed.  HENT:     Head: Normocephalic and atraumatic.  Eyes:     Conjunctiva/sclera: Conjunctivae normal.  Cardiovascular:     Rate and Rhythm:  Normal rate and regular rhythm.     Heart sounds: No murmur heard. Pulmonary:     Effort: Pulmonary effort is normal. No respiratory distress.     Breath sounds: Normal breath sounds. No wheezing, rhonchi or rales.  Abdominal:     Palpations: Abdomen is soft.     Tenderness: There is no abdominal tenderness. There is no right CVA tenderness, left CVA tenderness or guarding.  Musculoskeletal:        General: No swelling or deformity.     Cervical back: Neck supple.     Right lower leg: No edema.     Left lower leg: No edema.  Skin:    General: Skin is warm and dry.     Capillary Refill: Capillary refill takes less than 2 seconds.     Comments: Patient with decubitus ulcer noted right posterior.  Area well-healed with packing in place.  Packing removed and replaced using fresh packing material.  Area not warm to the touch.  No extending erythema or appreciable palpable fluctuance.  Neurological:     Mental Status: He is alert.  Psychiatric:        Mood and Affect: Mood normal.     ED Results / Procedures / Treatments   Labs (all labs ordered are listed, but only abnormal results are displayed) Labs Reviewed  BASIC METABOLIC PANEL - Abnormal; Notable for the following components:      Result Value   Glucose, Bld 100 (*)    All other components within normal limits  CBC WITH DIFFERENTIAL/PLATELET - Abnormal; Notable for the following components:   RBC 4.18 (*)    Hemoglobin 12.3 (*)    Lymphs Abs 0.5 (*)    All other components within normal limits  URINALYSIS, ROUTINE W REFLEX MICROSCOPIC - Abnormal; Notable for the following components:   Color, Urine STRAW (*)    All other components within normal limits  BRAIN NATRIURETIC PEPTIDE  TROPONIN I (HIGH SENSITIVITY)  TROPONIN I (HIGH SENSITIVITY)    EKG None  Radiology CT ABDOMEN PELVIS W CONTRAST  Result Date: 04/08/2023 CLINICAL DATA:  Left lower quadrant abdominal pain EXAM: CT ABDOMEN AND PELVIS WITH CONTRAST  TECHNIQUE: Multidetector CT imaging of the abdomen and pelvis was performed using the standard protocol following bolus administration of intravenous contrast. RADIATION DOSE REDUCTION: This exam was performed according to the departmental dose-optimization program which includes automated exposure control, adjustment of the mA and/or kV according to patient size and/or use of iterative reconstruction technique. CONTRAST:  OMNIPAQUE IOHEXOL 350 MG/ML SOLN COMPARISON:  12/02/2022 FINDINGS: Lower chest: Areas of scarring in the lung bases. Mild elevation of the left hemidiaphragm. No acute findings. Hepatobiliary: No focal hepatic abnormality. Gallbladder unremarkable. Pancreas: No focal abnormality or ductal dilatation. Spleen: No focal abnormality.  Normal size. Adrenals/Urinary Tract: No adrenal abnormality. No focal renal abnormality. No stones or hydronephrosis. Urinary bladder is unremarkable. Stomach/Bowel: Stomach, large and small bowel grossly unremarkable. Vascular/Lymphatic: Aortic atherosclerosis. No evidence of  aneurysm or adenopathy. Reproductive: No visible focal abnormality. Other: No free fluid or free air. Musculoskeletal: No acute bony abnormality. IMPRESSION: No acute findings in the abdomen or pelvis. Aortic atherosclerosis. Electronically Signed   By: Charlett Nose M.D.   On: 04/08/2023 17:12   CT Angio Chest PE W/Cm &/Or Wo Cm  Result Date: 04/08/2023 CLINICAL DATA:  Pulmonary embolism (PE) suspected, high prob. Chest pain, shortness of breath EXAM: CT ANGIOGRAPHY CHEST WITH CONTRAST TECHNIQUE: Multidetector CT imaging of the chest was performed using the standard protocol during bolus administration of intravenous contrast. Multiplanar CT image reconstructions and MIPs were obtained to evaluate the vascular anatomy. RADIATION DOSE REDUCTION: This exam was performed according to the departmental dose-optimization program which includes automated exposure control, adjustment of the mA  and/or kV according to patient size and/or use of iterative reconstruction technique. CONTRAST:  OMNIPAQUE IOHEXOL 350 MG/ML SOLN COMPARISON:  03/22/2023 FINDINGS: Cardiovascular: Heart is normal size. Aorta normal caliber. Moderate coronary artery and aortic calcifications. No filling defects in the pulmonary arteries to suggest pulmonary emboli. Mediastinum/Nodes: No mediastinal, hilar, or axillary adenopathy. Trachea and esophagus are unremarkable. Thyroid unremarkable. Lungs/Pleura: Moderate centrilobular emphysema. Areas of scarring in the mid lungs bilaterally, no new confluent airspace opacities. No effusions. Upper Abdomen: No acute findings Musculoskeletal: Chest wall soft tissues are unremarkable. No acute bony abnormality. Review of the MIP images confirms the above findings. IMPRESSION: No evidence of pulmonary embolus. Moderate coronary artery disease. Areas of scarring in the mid lungs bilaterally. No acute cardiopulmonary disease. Aortic Atherosclerosis (ICD10-I70.0) and Emphysema (ICD10-J43.9). Electronically Signed   By: Charlett Nose M.D.   On: 04/08/2023 17:09   DG Chest Port 1 View  Result Date: 04/08/2023 CLINICAL DATA:  Chest pain and shortness of breath. COPD. Lung carcinoma. EXAM: PORTABLE CHEST 1 VIEW COMPARISON:  03/30/2023 FINDINGS: The heart size and mediastinal contours are within normal limits. Coarse opacity is seen in the left perihilar region and lateral right midlung are unchanged in appearance, and consistent with chronic scarring/radiation changes. No evidence of acute infiltrate or pleural effusion. Pulmonary hyperinflation is again seen, consistent with COPD. IMPRESSION: No acute findings. Stable chronic scarring/radiation changes in left perihilar region and lateral right midlung. COPD. Electronically Signed   By: Danae Orleans M.D.   On: 04/08/2023 11:31    Procedures Procedures    Medications Ordered in ED Medications  ipratropium-albuterol (DUONEB) 0.5-2.5  (3) MG/3ML nebulizer solution 3 mL (3 mLs Nebulization Given 04/08/23 1143)  iohexol (OMNIPAQUE) 350 MG/ML injection 100 mL (100 mLs Intravenous Contrast Given 04/08/23 1649)    ED Course/ Medical Decision Making/ A&P                             Medical Decision Making Amount and/or Complexity of Data Reviewed Labs: ordered. Radiology: ordered.  Risk Prescription drug management.   This patient presents to the ED for concern of chest pain, this involves an extensive number of treatment options, and is a complaint that carries with it a high risk of complications and morbidity.  The differential diagnosis includes ACS, PE, pneumothorax, aortic dissection, pericarditis/myocarditis/tamponade, pneumonia, pneumothorax, musculoskeletal, COPD/asthma   Co morbidities that complicate the patient evaluation  See HPI   Additional history obtained:  Additional history obtained from EMR External records from outside source obtained and reviewed including hospital records   Lab Tests:  I Ordered, and personally interpreted labs.  The pertinent results include: No leukocytosis.  Patient  with mild evidence of anemia with a hemoglobin of 12.3 of which near patient's baseline.  Platelets within normal range.  UA without abnormality.  No electrolyte abnormalities.  No renal dysfunction.  Initial troponin of 3 with repeat 4; no acute abnormalities on EKG; low suspicion for ACS.  Patient with BNP within normal limits; no evidence of volume overload so low suspicion for CHF exacerbation.   Imaging Studies ordered:  I ordered imaging studies including chest x-ray, CT angio PE, CT abdomen pelvis I independently visualized and interpreted imaging which showed  Chest x-ray: No acute findings.  Stable chronic scarring/radiation changes in the left perihilar region and lateral right midlung.  COPD. CT angio PE: No evidence of pulmonary embolus.  Moderate coronary artery disease.  Scarring in mid lungs  bilaterally.  No acute cardiopulmonary disease.  Aortic atherosclerosis and emphysematous changes. CT abdomen pelvis: No acute abnormalities in abdomen or pelvis.  Aortic atherosclerosis. I agree with the radiologist interpretation  Cardiac Monitoring: / EKG:  The patient was maintained on a cardiac monitor.  I personally viewed and interpreted the cardiac monitored which showed an underlying rhythm of: Sinus rhythm with evidence of PVCs; no acute ischemic change   Consultations Obtained:  N/a   Problem List / ED Course / Critical interventions / Medication management  Chest pain I ordered medication including Duoneb Reevaluation of the patient after these medicines showed that the patient improved I have reviewed the patients home medicines and have made adjustments as needed   Social Determinants of Health:  Former cigarette use.  Denies illicit drug use.   Test / Admission - Considered:  Chest pain Vitals signs  within normal range and stable throughout visit. Laboratory/imaging studies significant for: See above 62 year old male presents emergency department for complaints of chest pain.  Workup today overall reassuring.  Low suspicion for ACS given [employer] medical troponin, lack of acute ischemic changes on EKG.  Doubt PE given negative PE study.  Doubt dissection.  No evidence of pneumothorax.  Patient without evidence of pneumonia, volume overload indicative of CHF with normal BNP.  Doubt pericarditis/myocarditis/tamponade.  Unsure of exact etiology of patient's chest pain.  Patient recommended follow-up with cardiology for reassessment given known coronary artery disease in the setting of chest pain.  Treatment plan discussed at length with patient and he acknowledged understanding was agreeable to said plan. Worrisome signs and symptoms were discussed with the patient, and the patient acknowledged understanding to return to the ED if noticed. Patient was stable upon discharge.           Final Clinical Impression(s) / ED Diagnoses Final diagnoses:  Chest pain, unspecified type    Rx / DC Orders ED Discharge Orders     None         Peter Garter, [place] 04/08/23 1949    Sloan Leiter, DO 04/09/23 1048

## 2023-04-08 NOTE — Discharge Instructions (Signed)
The workup today overall reassuring.  As discussed, it does not look like you are having a heart attack.  No evidence of progression of blood clot in the lung, pneumonia or other abnormality.  Recommend continuing your at home medications as prescribed and follow-up with cardiology outpatient.  Please do not hesitate to return to emergency department for worrisome signs and symptoms we discussed become apparent.

## 2023-04-10 ENCOUNTER — Ambulatory Visit (INDEPENDENT_AMBULATORY_CARE_PROVIDER_SITE_OTHER): Payer: Medicaid Other | Admitting: Student

## 2023-04-10 ENCOUNTER — Other Ambulatory Visit: Payer: Self-pay | Admitting: Student

## 2023-04-10 VITALS — BP 111/76 | HR 84 | Temp 98.7°F | Ht 72.0 in | Wt 162.0 lb

## 2023-04-10 DIAGNOSIS — Z86711 Personal history of pulmonary embolism: Secondary | ICD-10-CM

## 2023-04-10 DIAGNOSIS — I739 Peripheral vascular disease, unspecified: Secondary | ICD-10-CM | POA: Diagnosis not present

## 2023-04-10 DIAGNOSIS — Z23 Encounter for immunization: Secondary | ICD-10-CM | POA: Diagnosis present

## 2023-04-10 DIAGNOSIS — Z1211 Encounter for screening for malignant neoplasm of colon: Secondary | ICD-10-CM

## 2023-04-10 DIAGNOSIS — R296 Repeated falls: Secondary | ICD-10-CM | POA: Diagnosis not present

## 2023-04-10 DIAGNOSIS — I9589 Other hypotension: Secondary | ICD-10-CM | POA: Diagnosis not present

## 2023-04-10 DIAGNOSIS — Z0000 Encounter for general adult medical examination without abnormal findings: Secondary | ICD-10-CM

## 2023-04-10 NOTE — Assessment & Plan Note (Signed)
Chronic and stable. Mostly bedbound or wheelchair bound and thus does not ambulate much. Currently on statin therapy and eliquis.

## 2023-04-10 NOTE — Assessment & Plan Note (Signed)
BP 111/76 in clinic today. On midodrine 2.5mg  TID chronically. Denies any orthostatic symptoms at home.

## 2023-04-10 NOTE — Assessment & Plan Note (Signed)
Per niece, no falls in the past 3 months. Has been doing well with bed to chair transfers and vice versa.

## 2023-04-10 NOTE — Assessment & Plan Note (Addendum)
Although life expectancy is likely reduced given comorbid conditions, patient and niece would like to pursue colonoscopy at this time. Will place referral to GI for screening colonoscopy.  Tdap vaccination provided today.

## 2023-04-10 NOTE — Assessment & Plan Note (Signed)
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 Not requiring any oxygen. He is on eliquis  BID for anticoagulation.

## 2023-04-10 NOTE — Progress Notes (Signed)
   CC: ED f/u visit  HPI:  Mr.Billie L Guillet is a 62 y.o. male with history listed below presenting to the [facility] for ED f/u visit. Please see individualized problem based charting for full HPI.  Past Medical History:  Diagnosis Date   Asthma    Atypical chest pain 08/13/2022   Community acquired pneumonia 09/14/2022   COPD (chronic obstructive pulmonary disease)    Essential hypertension 08/19/2021   GERD (gastroesophageal reflux disease)    HAP (hospital-acquired pneumonia) 09/16/2022   History of tracheostomy    03/09/22-04/11/22   HLD (hyperlipidemia)    Hypertension    Hypokalemia 08/13/2022   Hypomagnesemia 08/13/2022   Lung cancer    PAD (peripheral artery disease)    Seizures 06/02/2022   Sepsis 08/13/2022   Stroke 02/2022    Review of Systems:  Negative aside from that listed in individualized problem based charting.  Physical Exam:  Vitals:   04/10/23 1311  BP: 111/76  Pulse: 84  Temp: 98.7 F (37.1 C)  TempSrc: Oral  SpO2: 100%  Weight: 162 lb (73.5 kg)   Physical Exam Constitutional:      Comments: Unkempt, chronically ill-appearing  HENT:     Mouth/Throat:     Mouth: Mucous membranes are moist.     Pharynx: Oropharynx is clear. No oropharyngeal exudate.  Eyes:     General: No scleral icterus.    Extraocular Movements: Extraocular movements intact.     Conjunctiva/sclera: Conjunctivae normal.     Pupils: Pupils are equal, round, and reactive to light.  Cardiovascular:     Rate and Rhythm: Normal rate and regular rhythm.     Heart sounds: Normal heart sounds. No murmur heard.    No gallop.  Pulmonary:     Effort: Pulmonary effort is normal.     Breath sounds: Normal breath sounds. No wheezing, rhonchi or rales.  Abdominal:     General: Bowel sounds are normal. There is no distension.     Palpations: Abdomen is soft.     Tenderness: There is no abdominal tenderness.  Musculoskeletal:        General: No swelling. Normal range of motion.  Skin:     General: Skin is warm and dry.     Comments: R foot without erythema or skin lesions. No warmth.  Neurological:     Mental Status: He is alert. Mental status is at baseline.     Comments: Dysarthric, able to say yes or no but otherwise incomprehensible speech (chronic)  Psychiatric:        Mood and Affect: Mood normal.        Behavior: Behavior normal.      Assessment & Plan:   See Encounters Tab for problem based charting.  Patient discussed with Dr.  Lafonda Mosses

## 2023-04-10 NOTE — Patient Instructions (Signed)
Mr. Alcorn,  It was a pleasure seeing you in the clinic today.   I placed a referral to the stomach doctors for your colonoscopy. We are going to get your tetanus shot today. Please come back in 2 months for your next visit.  Please call our clinic at [phone] if you have any questions or concerns. The best time to call is Monday-Friday from 9am-4pm, but there is someone available 24/7 at the same number. If you need medication refills, please notify your pharmacy one week in advance and they will send [place] a request.   Thank you for letting us take part in your care. We look forward to seeing you next time!

## 2023-04-11 NOTE — Progress Notes (Signed)
Internal Medicine Clinic Attending  Case discussed with Dr. Jinwala  At the time of the visit.  We reviewed the resident's history and exam and pertinent patient test results.  I agree with the assessment, diagnosis, and plan of care documented in the resident's note.  

## 2023-04-12 ENCOUNTER — Other Ambulatory Visit: Payer: Self-pay

## 2023-04-12 ENCOUNTER — Emergency Department (HOSPITAL_COMMUNITY): Payer: Medicaid Other

## 2023-04-12 ENCOUNTER — Inpatient Hospital Stay (HOSPITAL_COMMUNITY)
Admission: EM | Admit: 2023-04-12 | Discharge: 2023-04-15 | DRG: 871 | Disposition: A | Discharge status: Home / Self Care | Payer: Medicaid Other | Attending: Internal Medicine | Admitting: Internal Medicine

## 2023-04-12 DIAGNOSIS — J439 Emphysema, unspecified: Secondary | ICD-10-CM | POA: Diagnosis present

## 2023-04-12 DIAGNOSIS — I69322 Dysarthria following cerebral infarction: Secondary | ICD-10-CM

## 2023-04-12 DIAGNOSIS — I700 Atherosclerosis of aorta: Secondary | ICD-10-CM | POA: Diagnosis present

## 2023-04-12 DIAGNOSIS — L89312 Pressure ulcer of right buttock, stage 2: Secondary | ICD-10-CM | POA: Diagnosis present

## 2023-04-12 DIAGNOSIS — Z841 Family history of disorders of kidney and ureter: Secondary | ICD-10-CM

## 2023-04-12 DIAGNOSIS — E440 Moderate protein-calorie malnutrition: Secondary | ICD-10-CM | POA: Diagnosis present

## 2023-04-12 DIAGNOSIS — J189 Pneumonia, unspecified organism: Secondary | ICD-10-CM | POA: Diagnosis present

## 2023-04-12 DIAGNOSIS — Z7401 Bed confinement status: Secondary | ICD-10-CM

## 2023-04-12 DIAGNOSIS — I5022 Chronic systolic (congestive) heart failure: Secondary | ICD-10-CM | POA: Diagnosis present

## 2023-04-12 DIAGNOSIS — I69351 Hemiplegia and hemiparesis following cerebral infarction affecting right dominant side: Secondary | ICD-10-CM

## 2023-04-12 DIAGNOSIS — I69320 Aphasia following cerebral infarction: Secondary | ICD-10-CM

## 2023-04-12 DIAGNOSIS — A419 Sepsis, unspecified organism: Principal | ICD-10-CM | POA: Diagnosis present

## 2023-04-12 DIAGNOSIS — Z87898 Personal history of other specified conditions: Secondary | ICD-10-CM

## 2023-04-12 DIAGNOSIS — K219 Gastro-esophageal reflux disease without esophagitis: Secondary | ICD-10-CM | POA: Diagnosis present

## 2023-04-12 DIAGNOSIS — Z66 Do not resuscitate: Secondary | ICD-10-CM | POA: Diagnosis present

## 2023-04-12 DIAGNOSIS — S31819D Unspecified open wound of right buttock, subsequent encounter: Secondary | ICD-10-CM

## 2023-04-12 DIAGNOSIS — Z923 Personal history of irradiation: Secondary | ICD-10-CM

## 2023-04-12 DIAGNOSIS — Z7951 Long term (current) use of inhaled steroids: Secondary | ICD-10-CM

## 2023-04-12 DIAGNOSIS — R0902 Hypoxemia: Secondary | ICD-10-CM | POA: Diagnosis not present

## 2023-04-12 DIAGNOSIS — Z993 Dependence on wheelchair: Secondary | ICD-10-CM

## 2023-04-12 DIAGNOSIS — I110 Hypertensive heart disease with heart failure: Secondary | ICD-10-CM | POA: Diagnosis present

## 2023-04-12 DIAGNOSIS — Z800 Family history of malignant neoplasm of digestive organs: Secondary | ICD-10-CM

## 2023-04-12 DIAGNOSIS — Z7901 Long term (current) use of anticoagulants: Secondary | ICD-10-CM

## 2023-04-12 DIAGNOSIS — Z87891 Personal history of nicotine dependence: Secondary | ICD-10-CM

## 2023-04-12 DIAGNOSIS — Z86711 Personal history of pulmonary embolism: Secondary | ICD-10-CM | POA: Diagnosis present

## 2023-04-12 DIAGNOSIS — Z8673 Personal history of transient ischemic attack (TIA), and cerebral infarction without residual deficits: Secondary | ICD-10-CM

## 2023-04-12 DIAGNOSIS — Z85118 Personal history of other malignant neoplasm of bronchus and lung: Secondary | ICD-10-CM

## 2023-04-12 DIAGNOSIS — Z6821 Body mass index (BMI) 21.0-21.9, adult: Secondary | ICD-10-CM

## 2023-04-12 DIAGNOSIS — R569 Unspecified convulsions: Secondary | ICD-10-CM

## 2023-04-12 DIAGNOSIS — Z79899 Other long term (current) drug therapy: Secondary | ICD-10-CM

## 2023-04-12 DIAGNOSIS — Z9221 Personal history of antineoplastic chemotherapy: Secondary | ICD-10-CM

## 2023-04-12 DIAGNOSIS — C3411 Malignant neoplasm of upper lobe, right bronchus or lung: Secondary | ICD-10-CM | POA: Diagnosis present

## 2023-04-12 DIAGNOSIS — Z7902 Long term (current) use of antithrombotics/antiplatelets: Secondary | ICD-10-CM

## 2023-04-12 DIAGNOSIS — I739 Peripheral vascular disease, unspecified: Secondary | ICD-10-CM | POA: Diagnosis present

## 2023-04-12 DIAGNOSIS — E785 Hyperlipidemia, unspecified: Secondary | ICD-10-CM | POA: Diagnosis present

## 2023-04-12 DIAGNOSIS — J440 Chronic obstructive pulmonary disease with acute lower respiratory infection: Secondary | ICD-10-CM | POA: Diagnosis present

## 2023-04-12 DIAGNOSIS — Z1152 Encounter for screening for COVID-19: Secondary | ICD-10-CM

## 2023-04-12 DIAGNOSIS — Z808 Family history of malignant neoplasm of other organs or systems: Secondary | ICD-10-CM

## 2023-04-12 NOTE — ED Triage Notes (Signed)
Pt c/o chest pain that has been ongoing all week. Hx of stage 4 lung CA. Received chemo and completed it approx 6 months ago. Cousin at bedside is his caregiver and states that pt was supposed to have another round of chemo but never did. Pt has hx of CVA with R sided deficits and aphasia - able to answer yes/no questions. On eliquis. Diagnosed with COVID approx 1 month ago. Saw PCP regarding chest pain earlier this week - labs ordered at that time.

## 2023-04-13 ENCOUNTER — Encounter (HOSPITAL_COMMUNITY): Payer: Self-pay | Admitting: Radiology

## 2023-04-13 ENCOUNTER — Emergency Department (HOSPITAL_COMMUNITY): Payer: Medicaid Other

## 2023-04-13 ENCOUNTER — Telehealth: Payer: Self-pay | Admitting: Internal Medicine

## 2023-04-13 DIAGNOSIS — J189 Pneumonia, unspecified organism: Secondary | ICD-10-CM | POA: Diagnosis present

## 2023-04-13 DIAGNOSIS — Z808 Family history of malignant neoplasm of other organs or systems: Secondary | ICD-10-CM | POA: Diagnosis not present

## 2023-04-13 DIAGNOSIS — I69320 Aphasia following cerebral infarction: Secondary | ICD-10-CM | POA: Diagnosis not present

## 2023-04-13 DIAGNOSIS — E785 Hyperlipidemia, unspecified: Secondary | ICD-10-CM | POA: Diagnosis present

## 2023-04-13 DIAGNOSIS — I5022 Chronic systolic (congestive) heart failure: Secondary | ICD-10-CM | POA: Diagnosis present

## 2023-04-13 DIAGNOSIS — A419 Sepsis, unspecified organism: Secondary | ICD-10-CM | POA: Diagnosis not present

## 2023-04-13 DIAGNOSIS — Z923 Personal history of irradiation: Secondary | ICD-10-CM | POA: Diagnosis not present

## 2023-04-13 DIAGNOSIS — Z7901 Long term (current) use of anticoagulants: Secondary | ICD-10-CM | POA: Diagnosis not present

## 2023-04-13 DIAGNOSIS — Z9221 Personal history of antineoplastic chemotherapy: Secondary | ICD-10-CM | POA: Diagnosis not present

## 2023-04-13 DIAGNOSIS — Z86711 Personal history of pulmonary embolism: Secondary | ICD-10-CM

## 2023-04-13 DIAGNOSIS — I69351 Hemiplegia and hemiparesis following cerebral infarction affecting right dominant side: Secondary | ICD-10-CM | POA: Diagnosis not present

## 2023-04-13 DIAGNOSIS — J440 Chronic obstructive pulmonary disease with acute lower respiratory infection: Secondary | ICD-10-CM | POA: Diagnosis present

## 2023-04-13 DIAGNOSIS — Z8673 Personal history of transient ischemic attack (TIA), and cerebral infarction without residual deficits: Secondary | ICD-10-CM

## 2023-04-13 DIAGNOSIS — Z87898 Personal history of other specified conditions: Secondary | ICD-10-CM

## 2023-04-13 DIAGNOSIS — Z85118 Personal history of other malignant neoplasm of bronchus and lung: Secondary | ICD-10-CM | POA: Diagnosis not present

## 2023-04-13 DIAGNOSIS — E440 Moderate protein-calorie malnutrition: Secondary | ICD-10-CM | POA: Diagnosis present

## 2023-04-13 DIAGNOSIS — C3411 Malignant neoplasm of upper lobe, right bronchus or lung: Secondary | ICD-10-CM

## 2023-04-13 DIAGNOSIS — Z800 Family history of malignant neoplasm of digestive organs: Secondary | ICD-10-CM | POA: Diagnosis not present

## 2023-04-13 DIAGNOSIS — Z1152 Encounter for screening for COVID-19: Secondary | ICD-10-CM | POA: Diagnosis not present

## 2023-04-13 DIAGNOSIS — I110 Hypertensive heart disease with heart failure: Secondary | ICD-10-CM | POA: Diagnosis present

## 2023-04-13 DIAGNOSIS — I739 Peripheral vascular disease, unspecified: Secondary | ICD-10-CM | POA: Diagnosis present

## 2023-04-13 DIAGNOSIS — S31819D Unspecified open wound of right buttock, subsequent encounter: Secondary | ICD-10-CM

## 2023-04-13 DIAGNOSIS — Z66 Do not resuscitate: Secondary | ICD-10-CM | POA: Diagnosis present

## 2023-04-13 DIAGNOSIS — Z841 Family history of disorders of kidney and ureter: Secondary | ICD-10-CM | POA: Diagnosis not present

## 2023-04-13 DIAGNOSIS — Z87891 Personal history of nicotine dependence: Secondary | ICD-10-CM | POA: Diagnosis not present

## 2023-04-13 DIAGNOSIS — Z993 Dependence on wheelchair: Secondary | ICD-10-CM | POA: Diagnosis not present

## 2023-04-13 DIAGNOSIS — L89312 Pressure ulcer of right buttock, stage 2: Secondary | ICD-10-CM | POA: Diagnosis present

## 2023-04-13 DIAGNOSIS — J439 Emphysema, unspecified: Secondary | ICD-10-CM | POA: Diagnosis present

## 2023-04-13 HISTORY — DX: Sepsis, unspecified organism: A41.9

## 2023-04-13 LAB — CBC
HCT: 31.1 % — ABNORMAL LOW (ref 39.0–52.0)
HCT: 32.4 % — ABNORMAL LOW (ref 39.0–52.0)
Hemoglobin: 10.4 g/dL — ABNORMAL LOW (ref 13.0–17.0)
Hemoglobin: 9.7 g/dL — ABNORMAL LOW (ref 13.0–17.0)
MCH: 29.6 pg (ref 26.0–34.0)
MCH: 29.7 pg (ref 26.0–34.0)
MCHC: 31.2 g/dL (ref 30.0–36.0)
MCHC: 32.1 g/dL (ref 30.0–36.0)
MCV: 92.3 fL (ref 80.0–100.0)
MCV: 95.1 fL (ref 80.0–100.0)
Platelets: 150 10*3/uL (ref 150–400)
Platelets: 182 10*3/uL (ref 150–400)
RBC: 3.27 MIL/uL — ABNORMAL LOW (ref 4.22–5.81)
RBC: 3.51 MIL/uL — ABNORMAL LOW (ref 4.22–5.81)
RDW: 13.1 % (ref 11.5–15.5)
RDW: 13.2 % (ref 11.5–15.5)
WBC: 10.2 10*3/uL (ref 4.0–10.5)
WBC: 8.4 10*3/uL (ref 4.0–10.5)
nRBC: 0 % (ref 0.0–0.2)
nRBC: 0 % (ref 0.0–0.2)

## 2023-04-13 LAB — HEPATIC FUNCTION PANEL
ALT: 11 U/L (ref 0–44)
AST: 16 U/L (ref 15–41)
Albumin: 3.1 g/dL — ABNORMAL LOW (ref 3.5–5.0)
Alkaline Phosphatase: 81 U/L (ref 38–126)
Bilirubin, Direct: 0.1 mg/dL (ref 0.0–0.2)
Indirect Bilirubin: 0.4 mg/dL (ref 0.3–0.9)
Total Bilirubin: 0.5 mg/dL (ref 0.3–1.2)
Total Protein: 7.1 g/dL (ref 6.5–8.1)

## 2023-04-13 LAB — RESPIRATORY PANEL BY PCR

## 2023-04-13 LAB — APTT: aPTT: 33 seconds (ref 24–36)

## 2023-04-13 LAB — URINALYSIS, W/ REFLEX TO CULTURE (INFECTION SUSPECTED)
Bacteria, UA: NONE SEEN
Bilirubin Urine: NEGATIVE
Glucose, UA: NEGATIVE mg/dL
Hgb urine dipstick: NEGATIVE
Ketones, ur: NEGATIVE mg/dL
Leukocytes,Ua: NEGATIVE
Nitrite: NEGATIVE
Protein, ur: NEGATIVE mg/dL
Specific Gravity, Urine: 1.014 (ref 1.005–1.030)
pH: 7 (ref 5.0–8.0)

## 2023-04-13 LAB — LACTIC ACID, PLASMA
Lactic Acid, Venous: 1.5 mmol/L (ref 0.5–1.9)
Lactic Acid, Venous: 1 mmol/L (ref 0.5–1.9)

## 2023-04-13 LAB — RESP PANEL BY RT-PCR (RSV, FLU A&B, COVID)  RVPGX2
Influenza A by PCR: NEGATIVE
Influenza B by PCR: NEGATIVE
Resp Syncytial Virus by PCR: NEGATIVE
SARS Coronavirus 2 by RT PCR: NEGATIVE

## 2023-04-13 LAB — HIV ANTIBODY (ROUTINE TESTING W REFLEX): HIV Screen 4th Generation wRfx: NONREACTIVE

## 2023-04-13 LAB — BASIC METABOLIC PANEL
Anion gap: 9 (ref 5–15)
Anion gap: 9 (ref 5–15)
BUN: 17 mg/dL (ref 8–23)
BUN: 19 mg/dL (ref 8–23)
CO2: 28 mmol/L (ref 22–32)
CO2: 29 mmol/L (ref 22–32)
Calcium: 8.5 mg/dL — ABNORMAL LOW (ref 8.9–10.3)
Calcium: 8.7 mg/dL — ABNORMAL LOW (ref 8.9–10.3)
Chloride: 102 mmol/L (ref 98–111)
Chloride: 99 mmol/L (ref 98–111)
Creatinine, Ser: 0.77 mg/dL (ref 0.61–1.24)
Creatinine, Ser: 0.78 mg/dL (ref 0.61–1.24)
GFR, Estimated: >60 mL/min (ref >60)
GFR, Estimated: >60 mL/min (ref >60)
Glucose, Bld: 113 mg/dL — ABNORMAL HIGH (ref 70–99)
Glucose, Bld: 125 mg/dL — ABNORMAL HIGH (ref 70–99)
Potassium: 3.7 mmol/L (ref 3.5–5.1)
Potassium: 4.1 mmol/L (ref 3.5–5.1)
Sodium: 136 mmol/L (ref 135–145)
Sodium: 140 mmol/L (ref 135–145)

## 2023-04-13 LAB — PROTIME-INR
INR: 1.2 (ref 0.8–1.2)
Prothrombin Time: 15.5 seconds — ABNORMAL HIGH (ref 11.4–15.2)

## 2023-04-13 LAB — TROPONIN I (HIGH SENSITIVITY)
Troponin I (High Sensitivity): 4 ng/L (ref <18)
Troponin I (High Sensitivity): 4 ng/L (ref <18)

## 2023-04-13 LAB — STREP PNEUMONIAE URINARY ANTIGEN: Strep Pneumo Urinary Antigen: NEGATIVE

## 2023-04-13 MED ORDER — LACTATED RINGERS IV SOLN: INTRAVENOUS | Status: AC

## 2023-04-13 MED ORDER — VANCOMYCIN HCL 1500 MG/300ML IV SOLN
1500.0000 mg | Freq: Once | INTRAVENOUS | Status: AC
  Administered 2023-04-13: 1500 mg via INTRAVENOUS
  Filled 2023-04-13: qty 300

## 2023-04-13 MED ORDER — METRONIDAZOLE 500 MG/100ML IV SOLN
500.0000 mg | Freq: Once | INTRAVENOUS | Status: AC
  Administered 2023-04-13: 500 mg via INTRAVENOUS
  Filled 2023-04-13: qty 100

## 2023-04-13 MED ORDER — MORPHINE SULFATE (PF) 2 MG/ML IV SOLN: 2.0000 mg | INTRAVENOUS | Status: DC | PRN

## 2023-04-13 MED ORDER — ALBUTEROL SULFATE (2.5 MG/3ML) 0.083% IN NEBU: 2.5000 mg | INHALATION_SOLUTION | RESPIRATORY_TRACT | Status: DC | PRN

## 2023-04-13 MED ORDER — LEVETIRACETAM 500 MG PO TABS
500.0000 mg | ORAL_TABLET | Freq: Two times a day (BID) | ORAL | Status: DC
  Administered 2023-04-13 – 2023-04-15 (×5): 500 mg via ORAL
  Filled 2023-04-13 (×5): qty 1

## 2023-04-13 MED ORDER — SODIUM CHLORIDE 0.9 % IV SOLN
500.0000 mg | INTRAVENOUS | Status: DC
  Administered 2023-04-13 – 2023-04-15 (×3): 500 mg via INTRAVENOUS
  Filled 2023-04-13 (×3): qty 5

## 2023-04-13 MED ORDER — SODIUM CHLORIDE 0.9 % IV BOLUS
1000.0000 mL | Freq: Once | INTRAVENOUS | Status: AC
  Administered 2023-04-13: 1000 mL via INTRAVENOUS

## 2023-04-13 MED ORDER — VANCOMYCIN HCL IN DEXTROSE 1-5 GM/200ML-% IV SOLN: 1000.0000 mg | Freq: Once | INTRAVENOUS | Status: DC

## 2023-04-13 MED ORDER — SODIUM CHLORIDE 0.9 % IV SOLN
2.0000 g | Freq: Once | INTRAVENOUS | Status: AC
  Administered 2023-04-13: 2 g via INTRAVENOUS
  Filled 2023-04-13: qty 12.5

## 2023-04-13 MED ORDER — APIXABAN 5 MG PO TABS
5.0000 mg | ORAL_TABLET | Freq: Two times a day (BID) | ORAL | Status: DC
  Administered 2023-04-13 – 2023-04-15 (×5): 5 mg via ORAL
  Filled 2023-04-13 (×5): qty 1

## 2023-04-13 MED ORDER — SODIUM CHLORIDE 0.9 % IV SOLN
2.0000 g | INTRAVENOUS | Status: DC
  Administered 2023-04-13 – 2023-04-15 (×3): 2 g via INTRAVENOUS
  Filled 2023-04-13 (×3): qty 20

## 2023-04-13 MED ORDER — ACETAMINOPHEN 325 MG PO TABS
650.0000 mg | ORAL_TABLET | Freq: Once | ORAL | Status: AC
  Administered 2023-04-13: 650 mg via ORAL
  Filled 2023-04-13: qty 2

## 2023-04-13 NOTE — Assessment & Plan Note (Signed)
Cont Keppra  BID

## 2023-04-13 NOTE — Consult Note (Signed)
WOC Nurse Consult Note: Reason for Consult: R buttocks wound  Wound type: Stage 2 Pressure Injury  Pressure Injury POA: Yes Measurement:1 cm x 1.5 cms x 0.3 cms Stage 2 Pressure Injury R buttock  Wound bed: 100% pink and moist  Drainage (amount, consistency, odor) minimal serosanguinous  Periwound: intact  Dressing procedure/placement/frequency: Clean stage 2 R buttock with NS, apply silver hydrofiber [place] [phone]) cut to fit wound bed daily, cover with foam dressing. May lift foam dressing to reapply Silver.  Change foam dressing q3 days and prn soiling.  MAY NEED TO MOISTEN SILVER TO REMOVE IF STUCK TO WOUND BED.   POC discussed with patient and and bedside nurse.  WOC team will not follow at this time.  Re-consult if further needs arise.   Thank you,    Priscella Mann MSN, RN-BC, [employer] [phone]

## 2023-04-13 NOTE — Progress Notes (Signed)
This patient is alert and aphasic, he does not appear to be in any distress. This RN will anticipate this patients needs based on patients functioning until family is available as patient will say yeah while nodding no and vice versa. (Poor historian for assessment). Patient declined pen and paper for communication. Patient is made familiar with surroundings, call bell is within patients reach. Will continue to observe.

## 2023-04-13 NOTE — Assessment & Plan Note (Addendum)
With severe dysarthria at baseline. Wheelchair / bedbound at baseline as well it seems.

## 2023-04-13 NOTE — TOC Initial Note (Addendum)
Transition of Care [facility]) - Initial/Assessment Note    Patient Details  Name: Todd Estrada MRN: [id number] Date of Birth: [birth year]  Transition of Care [facility]) CM/SW Contact:    Howell Rucks, RN Phone Number: 04/13/2023, 2:58 PM  Clinical Narrative:  Met with pt in room today to introduce role of TOC/NCM, pt with hx of CVA, demonstrate aphasia. Per WOC-Stage 2 pressure ulcer to Rt buttocks- foam dressing, Pt currently on 02.   NCM outreached to alternate contact on file-     Abraha,James (Cousin), awaiting call back. Will continue to follow.           Expected Discharge Plan: Home/Self Care Barriers to Discharge: Continued Medical Work up   Patient Goals and CMS Choice Patient states their goals for this hospitalization and ongoing recovery are:: Home          Expected Discharge Plan and Services   Discharge Planning Services: CM Consult   Living arrangements for the past 2 months: Single Family Home                                      Prior Living Arrangements/Services Living arrangements for the past 2 months: Single Family Home Lives with:: Self Patient language and need for interpreter reviewed:: Yes Do you feel safe going back to the place where you live?: Yes      Need for Family Participation in Patient Care: Yes (Comment) Care giver support system in place?: Yes (comment)   Criminal Activity/Legal Involvement Pertinent to Current Situation/Hospitalization: No - Comment as needed  Activities of Daily Living Home Assistive Devices/Equipment: Other (Comment) (unknown will ask family later this a.m. Patient was able to scoot from stretcher to bed upon admission) ADL Screening (condition at time of admission) Patient's cognitive ability adequate to safely complete daily activities?: Yes Is the patient deaf or have difficulty hearing?: No Does the patient have difficulty seeing, even when wearing glasses/contacts?: No Does the patient have difficulty  concentrating, remembering, or making decisions?: Yes Patient able to express need for assistance with ADLs?: Yes Does the patient have difficulty dressing or bathing?: Yes (right arm paralysis, patient does use urinal independently) Does the patient have difficulty walking or climbing stairs?: Yes Weakness of Legs: Both Weakness of Arms/Hands: Right  Permission Sought/Granted                  Emotional Assessment Appearance:: Appears older than stated age Attitude/Demeanor/Rapport: Gracious Affect (typically observed): Accepting     Psych Involvement: No (comment)  Admission diagnosis:  Right upper lobe pneumonia [J18.9] Sepsis without acute organ dysfunction, due to unspecified organism [A41.9] Patient Active Problem List   Diagnosis Date Noted   Right upper lobe pneumonia 04/13/2023   Sepsis due to pneumonia 04/13/2023   Buttock wound, right, subsequent encounter 03/01/2023   PAD (peripheral artery disease) 01/06/2023   Chronic hypotension 01/06/2023   History of pulmonary embolism 10/12/2022   Cervical transverse process fracture 09/10/2022   Frequent falls 08/27/2022   Aphasia as late effect of cerebrovascular accident 08/16/2022   Anemia of chronic disease 08/13/2022   Chronic heart failure with mildly reduced ejection fraction (HFmrEF) (HCC) 08/13/2022   Necrotizing pneumonia 08/12/2022   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 06/24/2022   Encounter for antineoplastic chemotherapy 06/24/2022   Healthcare maintenance 06/22/2022   History of seizures 06/02/2022   Squamous cell lung cancer  05/10/2022   Emphysema lung 04/14/2022   Dyslipidemia    Dysphagia, post-stroke    Internal carotid artery occlusion, left 03/03/2022   Malnutrition of moderate degree 03/03/2022   Tobacco use disorder 08/19/2021   Hx of ischemic left MCA stroke 08/18/2021   PCP:  Merrilyn Puma, MD Pharmacy:   [facility] [phone] [place], [place] - [address]  AT [facility] [address] [place] [address] Phone: [phone] Fax: [phone]     Social Determinants of Health (SDOH) Social History: SDOH Screenings   Food Insecurity: No Food Insecurity (01/06/2023)  Housing: Low Risk  (01/06/2023)  Recent Concern: Housing - Medium Risk (10/12/2022)  Transportation Needs: No Transportation Needs (10/12/2022)  Utilities: Not At Risk (01/06/2023)  Depression (PHQ2-9): Low Risk  (04/10/2023)  Social Connections: Socially Isolated (01/06/2023)  Tobacco Use: Medium Risk (04/13/2023)   SDOH Interventions:     Readmission Risk Interventions    08/30/2022    3:39 PM 08/16/2022    4:28 PM  Readmission Risk Prevention Plan  Transportation Screening Complete Complete  Medication Review (RN Care Manager) Complete Complete  PCP or Specialist appointment within 3-5 days of discharge Complete   HRI or Home Care Consult Complete Complete  SW Recovery Care/Counseling Consult Complete Complete  Palliative Care Screening Not Applicable Not Applicable  Skilled Nursing Facility Not Applicable Not Applicable

## 2023-04-13 NOTE — Progress Notes (Signed)
Patient seen and examined this morning, admitted overnight, H&P reviewed and I agree with the assessment and plan.  62 year old male with history of COPD, squamous cell lung cancer status postradiation chemo, left upper lobe necrotizing pneumonia in the past status posttreatment, prior CVA with right-sided deficits and aphasia, chronic systolic CHF comes into the hospital with chest pain.  Imaging in the ER was concerning for right upper lobe pneumonia, and he was started on antibiotics  Continue ceftriaxone azithromycin, remains hypoxic this morning requiring supplemental oxygen, there was concern about this being lung recurrence and pulmonary was consulted, for now continue antibiotics and will need outpatient follow-up with oncology.  Discussed with Dr. Arbutus Ped as well  Scheduled Meds:  apixaban  5 mg Oral BID   levETIRAcetam  500 mg Oral BID   Continuous Infusions:  azithromycin     cefTRIAXone (ROCEPHIN)  IV     lactated ringers 150 mL/hr at 04/13/23 0256   PRN Meds:.albuterol, morphine injection  Todd Estrada M. Elvera Lennox, MD, PhD [facility]  Between 7 am - 7 pm you can contact me via Amion (for emergencies) or Securechat (non urgent matters).  I am not available 7 pm - 7 am, please contact night coverage MD/APP via Amion

## 2023-04-13 NOTE — H&P (Signed)
History and Physical    Patient: Todd Estrada BJY:[id number] DOB: [birth year] DOA: 04/12/2023 DOS: the patient was seen and examined on 04/13/2023 PCP: Merrilyn Puma, MD  Patient coming from: Home  Chief Complaint:  Chief Complaint  Patient presents with   Chest Pain   HPI: [name] is a 62 y.o. 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  Necrotizing PNA of LUL concerning for post obstructive PNA late last year.  Stroke with R sided deficits, seizures, and aphasia, HFrEF.  Pt in to ED today with c/o CP.  Pt unable to communicate past yes/no questions really due to his severe but baseline aphasia.  Chemo and radiation in past but not currently on chemo.  Family confirms DNR status over phone with EDP.  Per family, pt with CP for ~past week.  In to ED today.   Review of Systems: As mentioned in the history of present illness. All other systems reviewed and are negative. Past Medical History:  Diagnosis Date   Asthma    Atypical chest pain 08/13/2022   Community acquired pneumonia 09/14/2022   COPD (chronic obstructive pulmonary disease)    Essential hypertension 08/19/2021   GERD (gastroesophageal reflux disease)    HAP (hospital-acquired pneumonia) 09/16/2022   History of tracheostomy    03/09/22-04/11/22   HLD (hyperlipidemia)    Hypertension    Hypokalemia 08/13/2022   Hypomagnesemia 08/13/2022   Lung cancer    PAD (peripheral artery disease)    Seizures 06/02/2022   Sepsis 08/13/2022   Stroke 02/2022   Past Surgical History:  Procedure Laterality Date   BRONCHIAL BIOPSY  05/30/2022   Procedure: BRONCHIAL BIOPSIES;  Surgeon: Leslye Peer, MD;  Location: [facility] ENDOSCOPY;  Service: Pulmonary;;   BRONCHIAL BRUSHINGS  05/30/2022   Procedure: BRONCHIAL BRUSHINGS;  Surgeon: Leslye Peer, MD;  Location: [facility] ENDOSCOPY;  Service: Pulmonary;;   BRONCHIAL NEEDLE ASPIRATION BIOPSY  05/30/2022   Procedure: BRONCHIAL  NEEDLE ASPIRATION BIOPSIES;  Surgeon: Leslye Peer, MD;  Location: [facility] ENDOSCOPY;  Service: Pulmonary;;   BRONCHIAL WASHINGS  05/30/2022   Procedure: BRONCHIAL WASHINGS;  Surgeon: Leslye Peer, MD;  Location: [facility] ENDOSCOPY;  Service: Pulmonary;;   ESOPHAGOGASTRODUODENOSCOPY (EGD) WITH PROPOFOL N/A 03/11/2022   Procedure: ESOPHAGOGASTRODUODENOSCOPY (EGD) WITH PROPOFOL;  Surgeon: Violeta Gelinas, MD;  Location: [facility] ENDOSCOPY;  Service: General;  Laterality: N/A;   FIDUCIAL MARKER PLACEMENT  05/30/2022   Procedure: FIDUCIAL MARKER PLACEMENT;  Surgeon: Leslye Peer, MD;  Location: [facility] ENDOSCOPY;  Service: Pulmonary;;   IR ANGIO INTRA EXTRACRAN SEL COM CAROTID INNOMINATE UNI R MOD SED  03/02/2022   IR CT HEAD LTD  03/02/2022   IR PERCUTANEOUS ART THROMBECTOMY/INFUSION INTRACRANIAL INC DIAG ANGIO  03/02/2022   PEG PLACEMENT N/A 03/11/2022   Procedure: PERCUTANEOUS ENDOSCOPIC GASTROSTOMY (PEG) PLACEMENT;  Surgeon: Violeta Gelinas, MD;  Location: [facility] ENDOSCOPY;  Service: General;  Laterality: N/A;   RADIOLOGY WITH ANESTHESIA N/A 03/02/2022   Procedure: IR WITH ANESTHESIA;  Surgeon: Julieanne Cotton, MD;  Location: [facility] OR;  Service: Radiology;  Laterality: N/A;   VIDEO BRONCHOSCOPY WITH RADIAL ENDOBRONCHIAL ULTRASOUND  05/30/2022   Procedure: VIDEO BRONCHOSCOPY WITH RADIAL ENDOBRONCHIAL ULTRASOUND;  Surgeon: Leslye Peer, MD;  Location: [facility] ENDOSCOPY;  Service: Pulmonary;;   Social History:  reports that he quit smoking about a year ago. His smoking use included cigarettes. He has never been exposed to tobacco smoke. He has never used smokeless tobacco. He reports  that he does not currently use alcohol. He reports that he does not use drugs.  No Known Allergies  Family History  Problem Relation Age of Onset   Throat cancer Mother    Liver cancer Father    Kidney failure Sister    Cancer - Lung Paternal Uncle     Prior to Admission medications   Medication Sig Start Date End Date Taking?  Authorizing Provider  albuterol (PROVENTIL) (2.5 MG/3ML) 0.083% nebulizer solution Take 3 mLs (2.5 mg total) by nebulization every 4 (four) hours as needed for wheezing or shortness of breath. 08/16/22   Steffanie Rainwater, MD  apixaban (ELIQUIS) 5 MG TABS tablet TAKE 1 TABLET(5 MG) BY MOUTH TWICE DAILY 04/11/23   Merrilyn Puma, MD  atorvastatin (LIPITOR) 40 MG tablet Take 1 tablet (40 mg total) by mouth daily. 01/03/23 01/03/24  Merrilyn Puma, MD  budesonide (PULMICORT) 0.5 MG/2ML nebulizer solution NEW PRESCRIPTION REQEUST: BUDESONIDE 0.5 MG/ - USE ONE VIAL TWICE DAILY Patient taking differently: Take 0.5 mg by nebulization in the morning and at bedtime. 11/29/22   Martina Sinner, MD  doxycycline (VIBRAMYCIN) 100 MG capsule Take 1 capsule (100 mg total) by mouth 2 (two) times daily. 04/03/23   Clark, Meghan R, PA-C  folic acid (FOLVITE) 1 MG tablet Take 1 tablet (1 mg total) by mouth daily. 03/21/23   Merrilyn Puma, MD  levETIRAcetam (KEPPRA) 500 MG tablet Take 1 tablet (500 mg total) by mouth 2 (two) times daily. 12/07/22   Ihor Austin, NP  midodrine (PROAMATINE) 2.5 MG tablet Take 1 tablet (2.5 mg total) by mouth 3 (three) times daily with meals. 01/06/23 07/05/23  Merrilyn Puma, MD  Multiple Vitamin (MULTIVITAMIN WITH MINERALS) TABS tablet Take 1 tablet by mouth daily. Patient taking differently: Take 1 tablet by mouth daily with breakfast. 09/20/22   Doran Stabler, DO  ondansetron (ZOFRAN) 4 MG tablet Take 4 mg by mouth every 8 (eight) hours as needed for nausea or vomiting.    [provider]  Tiotropium Bromide-Olodaterol (STIOLTO RESPIMAT) 2.5-2.5 MCG/ACT AERS NEW PRESCRIPTION REQUEST: STIOLTO 2.5 MCG- INHALE TWO PUFFS BY MOUTH DAILY Patient taking differently: Inhale 2 each into the lungs daily. 11/29/22   Martina Sinner, MD    Physical Exam: Vitals:   04/12/23 2332 04/12/23 2345 04/13/23 0000 04/13/23 0130  BP: 117/71 102/73 103/74 115/73  Pulse: (!) 103 100 98 81   Resp: (!) 25 20 (!) 24 (!) 23  Temp: (!) 101.1 F (38.4 C)     TempSrc: Oral     SpO2: 94% 93% 95% 94%  Weight:      Height:       Constitutional: NAD, calm, comfortable Respiratory: coarse BS bilaterally Cardiovascular: Regular rate and rhythm, no murmurs / rubs / gallops. No extremity edema. 2+ pedal pulses. No carotid bruits.  Abdomen: no tenderness, no masses palpated. No hepatosplenomegaly. Bowel sounds positive.  Skin: Decubitus ulcer of R buttock, no obvious surrounding erythema, exudate, nor crepitus Neurologic: R sided hemiparesis / hemiplegia.  Dense expressive aphasia, can answer yes/no questions only, looks like this is baseline based on PCP note from 3 days ago.  Data Reviewed:       Latest Ref Rng & Units 04/12/2023   11:55 PM 04/08/2023   11:35 AM 03/30/2023    5:30 PM  CBC  WBC 4.0 - 10.5 K/uL 10.2  6.1  6.7   Hemoglobin 13.0 - 17.0 g/dL 40.9  81.1  91.4   Hematocrit 39.0 - 52.0 %  32.4  39.7  37.2   Platelets 150 - 400 K/uL 182  184  257       Latest Ref Rng & Units 04/13/2023   12:19 AM 04/12/2023   11:55 PM 04/08/2023   11:35 AM  CMP  Glucose 70 - 99 mg/dL  161  096   BUN 8 - 23 mg/dL  19  17   Creatinine 0.45 - 1.24 mg/dL  4.09  8.11   Sodium 914 - 145 mmol/L  136  139   Potassium 3.5 - 5.1 mmol/L  4.1  3.9   Chloride 98 - 111 mmol/L  99  101   CO2 22 - 32 mmol/L  28  30   Calcium 8.9 - 10.3 mg/dL  8.7  9.4   Total Protein 6.5 - 8.1 g/dL 7.1     Total Bilirubin 0.3 - 1.2 mg/dL 0.5     Alkaline Phos 38 - 126 U/L 81     AST 15 - 41 U/L 16     ALT 0 - 44 U/L 11      CXR: IMPRESSION: 1. Marked severity emphysematous lung disease with stable left perihilar coarse scarring and/or atelectasis. 2. Additional areas of scarring and atelectasis within the right upper lobe, with interval development of superimposed moderate severity infiltrate when compared to the prior exam.  CTA chest from 4 days ago: IMPRESSION: No evidence of pulmonary embolus.    Moderate coronary artery disease.   Areas of scarring in the mid lungs bilaterally.   No acute cardiopulmonary disease.   Aortic Atherosclerosis (ICD10-I70.0) and Emphysema (ICD10-J43.9).  COVID, FLU, RSV = neg  UA = neg  Trop neg x2  Lactate: 1.5 -> 1.0  Assessment and Plan: * Right upper lobe pneumonia CXR today shows new infilitrate in RUL when compared to x4 days ago.  With apparent sepsis as well given SIRS + PNA on presentation. Concerned that this might be post-obstructive from localized lung CA recurrence (see lung CA discussion below). PNA pathway Rocephin + azithromycin Tylenol PRN fever Lactate nl IVF: 1L bolus in ED Check RVP Check urine for legionella and s.pneumo  History of pulmonary embolism In setting of NSCLC. Cont eliquis.  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 Looks to me like pulmonary masses were present on CT chest x5 days ago though they called it "scarring" and "unchanged" from priors. Is radiologist sure that he doesn't have a cavitary lung mass in his RUL? And areas of spiculation / calcification just posterior and superior to said mass? Getting pulm consult in AM for second opinion on this, but I'm thinking he's got post-obstructive RUL PNA today due to possible recurrence of NSCLC. Given lack of positive CT read at this point, I did NOT discuss my concerns regarding NSCLC recurrence with patient yet.  Would like pulm to review first.  Buttock wound, right, subsequent encounter Wound care consult, seems to be healing.  Aphasia as late effect of cerebrovascular accident Chronic and baseline.  History of seizures Cont Keppra 500mg  BID  Hx of ischemic left MCA stroke With severe dysarthria at baseline. Wheelchair / bedbound at baseline as well it seems.      Advance Care Planning:   Code Status: DNR   Consults: Message sent to PCCM for AM consult.  Family  Communication: No family in room  Severity of Illness: The appropriate patient status for this patient is OBSERVATION. Observation status  is judged to be reasonable and necessary in order to provide the required intensity of service to ensure the patient's safety. The patient's presenting symptoms, physical exam findings, and initial radiographic and laboratory data in the context of their medical condition is felt to place them at decreased risk for further clinical deterioration. Furthermore, it is anticipated that the patient will be medically stable for discharge from the hospital within 2 midnights of admission.   Author: Hillary Bow., DO 04/13/2023 3:49 AM  For on call review [email or web address].

## 2023-04-13 NOTE — ED Provider Notes (Signed)
[place] EMERGENCY DEPARTMENT AT [facility] Provider Note   CSN: [id number] Arrival date & time: 04/12/23  2310     History  Chief Complaint  Patient presents with   Chest Pain    Todd Estrada is a 62 y.o. male.  HPI   Patient with medical history including COPD, stage IV squamous cell lung cancer, CVA with right-sided deficits, seizures, heart failure with reduced ejection fraction, presenting with complaint of chest pain.  Patient is unable to communicate due to his deficits, family is not at bedside, but per triage note patient is here for left-sided chest pain has been going on for a week, has received chemo the past currently not receiving at this time, on Eliquis has not missed any dosages.   Reviewed patient's notes, last chemoinfusion was on 08/12/2022, patient is currently DNR, not a part of palliative care or hospice at this time, lives with cousin, was most recently seen on the 20th for chest pain, CT imaging of the chest and abdomen were obtained both which were unremarkable, negative delta troponins, patient was later discharged home. Home Medications Prior to Admission medications   Medication Sig Start Date End Date Taking? Authorizing Provider  albuterol (PROVENTIL) (2.5 MG/3ML) 0.083% nebulizer solution Take 3 mLs (2.5 mg total) by nebulization every 4 (four) hours as needed for wheezing or shortness of breath. 08/16/22   Steffanie Rainwater, MD  apixaban (ELIQUIS) 5 MG TABS tablet TAKE 1 TABLET(5 MG) BY MOUTH TWICE DAILY 04/11/23   Merrilyn Puma, MD  atorvastatin (LIPITOR) 40 MG tablet Take 1 tablet (40 mg total) by mouth daily. 01/03/23 01/03/24  Merrilyn Puma, MD  budesonide (PULMICORT) 0.5 MG/2ML nebulizer solution NEW PRESCRIPTION REQEUST: BUDESONIDE 0.5 MG/ - USE ONE VIAL TWICE DAILY Patient taking differently: Take 0.5 mg by nebulization in the morning and at bedtime. 11/29/22   Martina Sinner, MD  doxycycline (VIBRAMYCIN) 100 MG capsule Take  1 capsule (100 mg total) by mouth 2 (two) times daily. 04/03/23   Clark, Meghan R, PA-C  folic acid (FOLVITE) 1 MG tablet Take 1 tablet (1 mg total) by mouth daily. 03/21/23   Merrilyn Puma, MD  levETIRAcetam (KEPPRA) 500 MG tablet Take 1 tablet (500 mg total) by mouth 2 (two) times daily. 12/07/22   Ihor Austin, NP  midodrine (PROAMATINE) 2.5 MG tablet Take 1 tablet (2.5 mg total) by mouth 3 (three) times daily with meals. 01/06/23 07/05/23  Merrilyn Puma, MD  Multiple Vitamin (MULTIVITAMIN WITH MINERALS) TABS tablet Take 1 tablet by mouth daily. Patient taking differently: Take 1 tablet by mouth daily with breakfast. 09/20/22   Doran Stabler, DO  ondansetron (ZOFRAN) 4 MG tablet Take 4 mg by mouth every 8 (eight) hours as needed for nausea or vomiting.    [provider]  Tiotropium Bromide-Olodaterol (STIOLTO RESPIMAT) 2.5-2.5 MCG/ACT AERS NEW PRESCRIPTION REQUEST: STIOLTO 2.5 MCG- INHALE TWO PUFFS BY MOUTH DAILY Patient taking differently: Inhale 2 each into the lungs daily. 11/29/22   Martina Sinner, MD      Allergies    Patient has no known allergies.    Review of Systems   Review of Systems  Unable to perform ROS: Patient nonverbal    Physical Exam Updated Vital Signs BP 115/73   Pulse 81   Temp (!) 101.1 F (38.4 C) (Oral)   Resp (!) 23   Ht 6' (1.829 m)   Wt 73.5 kg   SpO2 94%   BMI 21.98 kg/m  Physical  Exam Vitals and nursing note reviewed.  Constitutional:      General: He is not in acute distress.    Appearance: He is ill-appearing.     Comments: Chronically ill-appearing, disheveled,  HENT:     Head: Normocephalic and atraumatic.     Nose: No congestion.     Mouth/Throat:     Mouth: Mucous membranes are dry.     Pharynx: Oropharynx is clear.  Eyes:     Extraocular Movements: Extraocular movements intact.     Conjunctiva/sclera: Conjunctivae normal.     Pupils: Pupils are equal, round, and reactive to light.  Cardiovascular:     Rate and Rhythm:  Normal rate and regular rhythm.     Pulses: Normal pulses.     Heart sounds: No murmur heard.    No friction rub. No gallop.  Pulmonary:     Effort: No respiratory distress.     Breath sounds: No wheezing, rhonchi or rales.     Comments: Harsh sounding lungs without obvious rales rhonchi or wheezing present. Abdominal:     Palpations: Abdomen is soft.     Tenderness: There is no abdominal tenderness. There is no right CVA tenderness or left CVA tenderness.  Skin:    General: Skin is warm and dry.  Neurological:     Mental Status: He is alert.     Comments: No noted facial asymmetry, moving his left upper and lower extremities out difficulty, unable to move the right arm, has minimal movement in the right leg.  This is his baseline.  Psychiatric:        Mood and Affect: Mood normal.     ED Results / Procedures / Treatments   Labs (all labs ordered are listed, but only abnormal results are displayed) Labs Reviewed  BASIC METABOLIC PANEL - Abnormal; Notable for the following components:      Result Value   Glucose, Bld 125 (*)    Calcium 8.7 (*)    All other components within normal limits  CBC - Abnormal; Notable for the following components:   RBC 3.51 (*)    Hemoglobin 10.4 (*)    HCT 32.4 (*)    All other components within normal limits  PROTIME-INR - Abnormal; Notable for the following components:   Prothrombin Time 15.5 (*)    All other components within normal limits  HEPATIC FUNCTION PANEL - Abnormal; Notable for the following components:   Albumin 3.1 (*)    All other components within normal limits  RESP PANEL BY RT-PCR (RSV, FLU A&B, COVID)  RVPGX2  CULTURE, BLOOD (ROUTINE X 2)  CULTURE, BLOOD (ROUTINE X 2)  LACTIC ACID, PLASMA  APTT  LACTIC ACID, PLASMA  URINALYSIS, W/ REFLEX TO CULTURE (INFECTION SUSPECTED)  TROPONIN I (HIGH SENSITIVITY)  TROPONIN I (HIGH SENSITIVITY)    EKG None  Radiology CT Head Wo Contrast  Result Date: 04/13/2023 CLINICAL DATA:   Mental status change, unknown cause. EXAM: CT HEAD WITHOUT CONTRAST TECHNIQUE: Contiguous axial images were obtained from the base of the skull through the vertex without intravenous contrast. RADIATION DOSE REDUCTION: This exam was performed according to the departmental dose-optimization program which includes automated exposure control, adjustment of the mA and/or kV according to patient size and/or use of iterative reconstruction technique. COMPARISON:  03/09/2023. FINDINGS: Brain: No acute hemorrhage, midline shift or mass effect. No extra-axial fluid collection. Diffuse atrophy is noted. There is encephalomalacia in the left cerebral hemisphere and left cerebral hemisphere. Periventricular white matter hypodensities are  present bilaterally. No hydrocephalus. Vascular: No hyperdense vessel or unexpected calcification. Skull: Normal. Negative for fracture or focal lesion. Sinuses/Orbits: There is partial opacification of the ethmoid air cells and right maxillary sinus. No acute orbital abnormality. Other: None. IMPRESSION: Stable head CT with no acute intracranial process. Electronically Signed   By: Thornell Sartorius M.D.   On: 04/13/2023 01:32   DG Chest 2 View  Result Date: 04/13/2023 CLINICAL DATA:  History of lung cancer presenting with chest pain. EXAM: CHEST - 2 VIEW COMPARISON:  April 08, 2023 FINDINGS: The heart size and mediastinal contours are within normal limits. There is marked severity emphysematous lung disease. A stable area of coarse parenchymal opacification is seen within the left perihilar region. Moderate to marked severity focal opacification is also seen within the mid right lung. This is increased in size when compared to the prior study. A small radiopaque surgical clip is also seen within the mid right lung. There is no evidence of a pleural effusion or pneumothorax. The visualized skeletal structures are unremarkable. IMPRESSION: 1. Marked severity emphysematous lung disease with  stable left perihilar coarse scarring and/or atelectasis. 2. Additional areas of scarring and atelectasis within the right upper lobe, with interval development of superimposed moderate severity infiltrate when compared to the prior exam. Electronically Signed   By: Aram Candela M.D.   On: 04/13/2023 00:28    Procedures .Critical Care  Performed by: Carroll Sage, PA-C Authorized by: Carroll Sage, PA-C   Critical care provider statement:    Critical care time (minutes):  30   Critical care time was exclusive of:  Separately billable procedures and treating other patients   Critical care was necessary to treat or prevent imminent or life-threatening deterioration of the following conditions:  Sepsis   Critical care was time spent personally by me on the following activities:  Discussions with consultants, evaluation of patient's response to treatment, examination of patient, ordering and review of laboratory studies, ordering and review of radiographic studies, ordering and performing treatments and interventions, pulse oximetry, re-evaluation of patient's condition and review of old charts   I assumed direction of critical care for this patient from another provider in my specialty: no     Care discussed with: admitting provider       Medications Ordered in ED Medications  lactated ringers infusion (has no administration in time range)  metroNIDAZOLE (FLAGYL) IVPB 500 mg (500 mg Intravenous New Bag/Given 04/13/23 0236)  vancomycin (VANCOREADY) IVPB 1500 mg/300 mL (has no administration in time range)  apixaban (ELIQUIS) tablet 5 mg (has no administration in time range)  levETIRAcetam (KEPPRA) tablet 500 mg (has no administration in time range)  albuterol (PROVENTIL) (2.5 MG/3ML) 0.083% nebulizer solution 2.5 mg (has no administration in time range)  ceFEPIme (MAXIPIME) 2 g in sodium chloride 0.9 % 100 mL IVPB (0 g Intravenous Stopped 04/13/23 0230)  acetaminophen (TYLENOL)  tablet 650 mg (650 mg Oral Given 04/13/23 0054)  sodium chloride 0.9 % bolus 1,000 mL (0 mLs Intravenous Stopped 04/13/23 0230)    ED Course/ Medical Decision Making/ A&P                             Medical Decision Making Amount and/or Complexity of Data Reviewed Labs: ordered. Radiology: ordered.  Risk OTC drugs. Prescription drug management. Decision regarding hospitalization.   This patient presents to the ED for concern of chest pain, this involves an extensive number of  treatment options, and is a complaint that carries with it a high risk of complications and morbidity.  The differential diagnosis includes ACS, PE, sepsis, pneumonia    Additional history obtained:  Additional history obtained from the N/A External records from outside source obtained and reviewed including oncology notes, recent hospitalization   Co morbidities that complicate the patient evaluation  CVA, right-sided weakness, on anticoagulant  Social Determinants of Health:  Nonverbal    Lab Tests:  I Ordered, and personally interpreted labs.  The pertinent results include: CBC shows normocytic anemia hemoglobin 10.4 slightly below baseline, BMP reveals glucose of 125, first troponin is 4, hepatic panel unremarkable, prothrombin time 15.5, INR 1.2, APTT 33, respiratory panel negative, lactic 1.5   Imaging Studies ordered:  I ordered imaging studies including CT head, chest x-ray I independently visualized and interpreted imaging which showed CT head negative, chest x-ray infiltrates noted in the right upper lobe I agree with the radiologist interpretation   Cardiac Monitoring:  The patient was maintained on a cardiac monitor.  I personally viewed and interpreted the cardiac monitored which showed an underlying rhythm of: EKG without signs of ischemia   Medicines ordered and prescription drug management:  I ordered medication including fluids, antipyretics I have reviewed the patients  home medicines and have made adjustments as needed  Critical Interventions:  Presents febrile, tachycardic, borderline hypotensive, will activate code sepsis on broad-spectrum antibiotics Reassessed the patient vital signs have improved no longer tachycardic or febrile BP has normalized.   Reevaluation:  Attempted to contact cousin in emergency contacts but goes straight to voicemail.  Will send patient down for CT head as I was unable to communicate with the patient and he is on anticoagulant rule out possible head bleed.  CT imaging is unremarkable, chest x-ray shows possible pneumonia, patient currently on broad-spectrum antibiotics, will admit to medicine for continued treatment.  Consultations Obtained:  I requested consultation with the Dr. Julian Reil,  and discussed lab and imaging findings as well as pertinent plan - they recommend: He will admit the patient    Test Considered:  CT abdomen pelvis-deferred advised patient for intra-abdominal abnormalities at this time as he has nonsurgical abdomen.    Rule out Suspicion for intracranial bleed or mass is low at this time his CT imaging is negative.  I doubt CVA as there is no new focal deficit present on exam, presentation is atypical of etiology, presents tachycardic and febrile likely infectious etiology.  I doubt ACS EKG without signs of ischemia for troponin is negative.  I doubt PE currently on anticoags, patient has no new oxygen requirements, he did present tachycardic but this has improved after fluids as well as given antipyretics I suspect is likely tachycardia was results of his fever.  I have low suspicion for liver or gallbladder abnormality as she has no right upper quadrant tenderness, liver enzymes, alk phos, T bili all within normal limits.  Low suspicion for pancreatitis as lipase is within normal limits.  Low suspicion for ruptured stomach ulcer as she has no peritoneal sign present on exam.  Low suspicion for bowel  obstruction as abdomen is nondistended normal bowel sounds.     Dispostion and problem list  After consideration of the diagnostic results and the patients response to treatment, I feel that the patent would benefit from admission.  Sepsis-unclear etiology but my suspicion is likely right lobe pneumonia, started on broad-spectrum antibiotics, will likely need de-escalation continue management.  Final Clinical Impression(s) / ED Diagnoses Final diagnoses:  Sepsis without acute organ dysfunction, due to unspecified organism    Rx / DC Orders ED Discharge Orders     None         Carroll Sage, PA-C 04/13/23 0250    Glendora Score, MD 04/14/23 [phone]

## 2023-04-13 NOTE — Sepsis Progress Note (Signed)
Following for sepsis monitoring ?

## 2023-04-13 NOTE — Telephone Encounter (Signed)
Scheduled per 04/25 inbasket message, patient has been called and notified.  

## 2023-04-13 NOTE — Progress Notes (Signed)
A consult was received from an ED physician for Vancomycin and Cefepime per pharmacy dosing.  The patient's profile has been reviewed for ht/wt/allergies/indication/available labs.    A one time order has been placed for Vancomycin  IV and Cefepime 2gm IV.    Further antibiotics/pharmacy consults should be ordered by admitting physician if indicated.                       Thank you, Maryellen Pile, PharmD 04/13/2023  12:11 AM

## 2023-04-13 NOTE — Assessment & Plan Note (Addendum)
CXR today shows new infilitrate in RUL when compared to x4 days ago.  With apparent sepsis as well given SIRS + PNA on presentation. Concerned that this might be post-obstructive from localized lung CA recurrence (see lung CA discussion below). PNA pathway Rocephin + azithromycin Tylenol PRN fever Lactate nl IVF: 1L bolus in ED Check RVP Check urine for legionella and s.pneumo

## 2023-04-13 NOTE — Assessment & Plan Note (Signed)
In setting of NSCLC. Cont eliquis.

## 2023-04-13 NOTE — Assessment & Plan Note (Signed)
Wound care consult, seems to be healing.

## 2023-04-13 NOTE — Assessment & Plan Note (Signed)
Chronic and baseline. 

## 2023-04-13 NOTE — Progress Notes (Signed)
Initial Nutrition Assessment  DOCUMENTATION CODES:   Non-severe (moderate) malnutrition in context of chronic illness  INTERVENTION:  - DYS 3 diet, pt is edentulous. - Automatic house trays as patient has severe aphasia.  - Assistance with all meals per patient request.   - Magic cup BID with meals, each supplement provides 290 kcal and 9 grams of protein - Encourage intake at all meals.   - Monitor weight trends.   NUTRITION DIAGNOSIS:   Moderate Malnutrition related to chronic illness (COPD; squamous cell lung cancer) as evidenced by moderate fat depletion, severe muscle depletion.  GOAL:   Patient will meet greater than or equal to 90% of their needs  MONITOR:   PO intake, Supplement acceptance, Weight trends  REASON FOR ASSESSMENT:   Malnutrition Screening Tool    ASSESSMENT:   62 y.o. male with PMH of COPD, squamous cell lung CA of RUL s/p radiation and chemo, stroke with R sided deficits, seizures, and aphasia who presented with pneumonia.   Patient sitting in bed, eating breakfast at time of visit.  Patient noted to have history of aphasia and during interview was only able to respond with yes or no.    Patient did not know his UBW but denied any recent changes in weighty.. Per EMR, weight has been stable since August. He endorses eating 2 meals a day at home and drinks a nutrition supplements once daily. Was unable to state which nutrition supplement but denied that it was Ensure or Boost.   Did not want to try Ensure, Boost, or Boost Breeze but agreeable to receive Magic Cup during admission.  Current appetite is good. Encouraged patient to try and consume 3 meals a day and consume supplements during admission. Patient then held up cup of grits and responded yes when asked if he needed help eating. Will order assistance with all meals and add automatic house trays due to severe aphasia.   Medications reviewed and include: -  Labs reviewed: -  NUTRITION -  FOCUSED PHYSICAL EXAM:  Flowsheet Row Most Recent Value  Orbital Region Moderate depletion  Upper Arm Region Moderate depletion  Thoracic and Lumbar Region Mild depletion  Buccal Region Severe depletion  Temple Region Severe depletion  Clavicle Bone Region Moderate depletion  Clavicle and Acromion Bone Region Severe depletion  Scapular Bone Region Unable to assess  Dorsal Hand Moderate depletion  Patellar Region Mild depletion  Anterior Thigh Region Mild depletion  Posterior Calf Region Mild depletion  Edema (RD Assessment) None  Hair Reviewed  Eyes Reviewed  Mouth Reviewed  [edentulous]  Skin Reviewed  Nails Reviewed       Diet Order:   Diet Order             DIET DYS 3 Room service appropriate? Yes; Fluid consistency: Thin  Diet effective now                   EDUCATION NEEDS:  Education needs have been addressed  Skin:  Skin Assessment: Skin Integrity Issues: Skin Integrity Issues:: Stage II Stage II: R buttocks  Last BM:  PTA  Height:  Ht Readings from Last 1 Encounters:  04/12/23 6' (1.829 m)   Weight:  Wt Readings from Last 1 Encounters:  04/12/23 73.5 kg    BMI:  Body mass index is 21.98 kg/m.  Estimated Nutritional Needs:  Kcal:  5784-6962 kcals Protein:  100-115 grams Fluid:  >/= 2L    Shelle Iron RD, LDN For contact information, refer to  [facility].

## 2023-04-13 NOTE — Consult Note (Signed)
NAME:  Todd Estrada, MRN:  [id number], DOB:  05/19/1961, LOS: 0 ADMISSION DATE:  04/12/2023, CONSULTATION DATE:  04/13/23  REFERRING MD:  TRH, CHIEF COMPLAINT: Chest pain  History of Present Illness:  62 year old man presented to ED with chest pain with chest x-ray slightly worse than prior but on my review interpretation largely unchanged over time.  History very difficult to obtain due to severe dysarthria.  He has no real complaints for me today.  He came with chest pain.  Does not seem to have any worsening cough etc.  He was admitted to pill antibiotics.  Chest x-ray on admission was slightly worse on the right middle lung field infiltrate compared to recent chest x-ray 4/20/2 for but similar in appearance to 03/22/2023.  Reviewed the last 3 cross-sectional imaging of the chest via the CT that shows unchanged chronic infiltrative/fibrotic changes in bilateral upper lobes and emphysema.  Pertinent  Medical History  Lung cancer status post chemoradiation, COPD, history of necrotizing left upper lobe pneumonia  Significant Hospital Events: Including procedures, antibiotic start and stop dates in addition to other pertinent events   4/2 5/24 for came to the hospital with chest pain  Interim History / Subjective:    Objective   Blood pressure (!) 136/92, pulse (!) 57, temperature 97.9 F (36.6 C), resp. rate 18, height 6' (1.829 m), weight 73.5 kg, SpO2 (!) 83 %.        Intake/Output Summary (Last 24 hours) at 04/13/2023 1625 Last data filed at 04/13/2023 1300 Gross per 24 hour  Intake 1568.22 ml  Output 1500 ml  Net 68.22 ml   Filed Weights   04/12/23 2328  Weight: 73.5 kg    Examination: General: Chronically ill-appearing, lying in bed HENT: Moist mucous membranes, atraumatic, normocephalic Lungs: Diffuse rhonchi with inspiration bilaterally, normal work of breathing Cardiovascular: Regular rate and rhythm, no murmur Abdomen: Nondistended, bowel sounds present Extremities:  Nontender, no edema Neuro: Severe dysarthria right-sided weakness, left side okay   Resolved Hospital Problem list     Assessment & Plan:  Bilateral chest infiltrates: On serial CT scans this corresponds with areas of fibrosis presumed related to old infection and prior radiation.  Unchanged over time.  Chest x-ray on admission looks little worse than most recent x-ray prior 04/08/2023 but fairly similar to 03/22/2023.  Change could be related to technique or new pneumonia. -- Agree with antibiotics, per primary  PCCM will sign off  Best Practice (right click and "Reselect all SmartList Selections" daily)   Per primary  Labs   CBC: Recent Labs  Lab 04/08/23 1135 04/12/23 2355 04/13/23 0605  WBC 6.1 10.2 8.4  NEUTROABS 4.9  --   --   HGB 12.3* 10.4* 9.7*  HCT 39.7 32.4* 31.1*  MCV 95.0 92.3 95.1  PLT 184 182 150    Basic Metabolic Panel: Recent Labs  Lab 04/08/23 1135 04/12/23 2355 04/13/23 0605  NA 139 136 140  K 3.9 4.1 3.7  CL 101 99 102  CO2 30 28 29   GLUCOSE 100* 125* 113*  BUN 17 19 17   CREATININE 0.84 0.77 0.78  CALCIUM 9.4 8.7* 8.5*   GFR: Estimated Creatinine Clearance: 99.5 mL/min (by C-G formula based on SCr of 0.78 mg/dL). Recent Labs  Lab 04/08/23 1135 04/12/23 2355 04/13/23 0043 04/13/23 0254 04/13/23 0605  WBC 6.1 10.2  --   --  8.4  LATICACIDVEN  --   --  1.5 1.0  --     Liver Function  Tests: Recent Labs  Lab 04/13/23 0019  AST 16  ALT 11  ALKPHOS 81  BILITOT 0.5  PROT 7.1  ALBUMIN 3.1*   No results for input(s): "LIPASE", "AMYLASE" in the last 168 hours. No results for input(s): "AMMONIA" in the last 168 hours.  ABG    Component Value Date/Time   PHART 7.49 (H) 03/28/2022 1205   PCO2ART 41 03/28/2022 1205   PO2ART 87 03/28/2022 1205   HCO3 31.2 (H) 03/28/2022 1205   TCO2 31 09/25/2022 1443   O2SAT 99.4 03/28/2022 1205     Coagulation Profile: Recent Labs  Lab 04/13/23 0019  INR 1.2    Cardiac Enzymes: No results  for input(s): "CKTOTAL", "CKMB", "CKMBINDEX", "TROPONINI" in the last 168 hours.  HbA1C: Hgb A1c MFr Bld  Date/Time Value Ref Range Status  03/03/2022 04:30 AM 5.4 4.8 - 5.6 % Final    Comment:    (NOTE)         Prediabetes: 5.7 - 6.4         Diabetes: >6.4         Glycemic control for adults with diabetes: <7.0   08/19/2021 07:49 AM 5.5 4.8 - 5.6 % Final    Comment:    (NOTE) Pre diabetes:          5.7%-6.4%  Diabetes:              >6.4%  Glycemic control for   <7.0% adults with diabetes     CBG: No results for input(s): "GLUCAP" in the last 168 hours.  Review of Systems:   Unable to obtain due to severe dysarthria  Past Medical History:  He,  has a past medical history of Asthma, Atypical chest pain (08/13/2022), Community acquired pneumonia (09/14/2022), COPD (chronic obstructive pulmonary disease), Essential hypertension (08/19/2021), GERD (gastroesophageal reflux disease), HAP (hospital-acquired pneumonia) (09/16/2022), History of tracheostomy, HLD (hyperlipidemia), Hypertension, Hypokalemia (08/13/2022), Hypomagnesemia (08/13/2022), Lung cancer, PAD (peripheral artery disease), Seizures (06/02/2022), Sepsis (08/13/2022), and Stroke (02/2022).   Surgical History:   Past Surgical History:  Procedure Laterality Date   BRONCHIAL BIOPSY  05/30/2022   Procedure: BRONCHIAL BIOPSIES;  Surgeon: Leslye Peer, MD;  Location: [facility] ENDOSCOPY;  Service: Pulmonary;;   BRONCHIAL BRUSHINGS  05/30/2022   Procedure: BRONCHIAL BRUSHINGS;  Surgeon: Leslye Peer, MD;  Location: [facility] ENDOSCOPY;  Service: Pulmonary;;   BRONCHIAL NEEDLE ASPIRATION BIOPSY  05/30/2022   Procedure: BRONCHIAL NEEDLE ASPIRATION BIOPSIES;  Surgeon: Leslye Peer, MD;  Location: [facility] ENDOSCOPY;  Service: Pulmonary;;   BRONCHIAL WASHINGS  05/30/2022   Procedure: BRONCHIAL WASHINGS;  Surgeon: Leslye Peer, MD;  Location: [facility] ENDOSCOPY;  Service: Pulmonary;;   ESOPHAGOGASTRODUODENOSCOPY (EGD) WITH PROPOFOL N/A  03/11/2022   Procedure: ESOPHAGOGASTRODUODENOSCOPY (EGD) WITH PROPOFOL;  Surgeon: Violeta Gelinas, MD;  Location: [facility] ENDOSCOPY;  Service: General;  Laterality: N/A;   FIDUCIAL MARKER PLACEMENT  05/30/2022   Procedure: FIDUCIAL MARKER PLACEMENT;  Surgeon: Leslye Peer, MD;  Location: [facility] ENDOSCOPY;  Service: Pulmonary;;   IR ANGIO INTRA EXTRACRAN SEL COM CAROTID INNOMINATE UNI R MOD SED  03/02/2022   IR CT HEAD LTD  03/02/2022   IR PERCUTANEOUS ART THROMBECTOMY/INFUSION INTRACRANIAL INC DIAG ANGIO  03/02/2022   PEG PLACEMENT N/A 03/11/2022   Procedure: PERCUTANEOUS ENDOSCOPIC GASTROSTOMY (PEG) PLACEMENT;  Surgeon: Violeta Gelinas, MD;  Location: [facility] ENDOSCOPY;  Service: General;  Laterality: N/A;   RADIOLOGY WITH ANESTHESIA N/A 03/02/2022   Procedure: IR WITH ANESTHESIA;  Surgeon: Julieanne Cotton, MD;  Location: [facility] OR;  Service:  Radiology;  Laterality: N/A;   VIDEO BRONCHOSCOPY WITH RADIAL ENDOBRONCHIAL ULTRASOUND  05/30/2022   Procedure: VIDEO BRONCHOSCOPY WITH RADIAL ENDOBRONCHIAL ULTRASOUND;  Surgeon: Leslye Peer, MD;  Location: [facility] ENDOSCOPY;  Service: Pulmonary;;     Social History:   reports that he quit smoking about a year ago. His smoking use included cigarettes. He has never been exposed to tobacco smoke. He has never used smokeless tobacco. He reports that he does not currently use alcohol. He reports that he does not use drugs.   Family History:  His family history includes Cancer - Lung in his paternal uncle; Kidney failure in his sister; Liver cancer in his father; Throat cancer in his mother.   Allergies No Known Allergies   Home Medications  Prior to Admission medications   Medication Sig Start Date End Date Taking? Authorizing Provider  apixaban (ELIQUIS) 5 MG TABS tablet TAKE 1 TABLET(5 MG) BY MOUTH TWICE DAILY Patient taking differently: Take 5 mg by mouth 2 (two) times daily. 04/11/23  Yes Merrilyn Puma, MD  atorvastatin (LIPITOR) 40 MG tablet Take 1 tablet (40 mg  total) by mouth daily. 01/03/23 01/03/24 Yes Merrilyn Puma, MD  budesonide (PULMICORT) 0.5 MG/2ML nebulizer solution NEW PRESCRIPTION REQEUST: BUDESONIDE 0.5 MG/ - USE ONE VIAL TWICE DAILY Patient taking differently: Take 0.5 mg by nebulization in the morning and at bedtime. 11/29/22  Yes Martina Sinner, MD  clopidogrel (PLAVIX) 75 MG tablet Take 75 mg by mouth daily.   Yes [provider]  doxycycline (VIBRAMYCIN) 100 MG capsule Take 1 capsule (100 mg total) by mouth 2 (two) times daily. Patient taking differently: Take 100 mg by mouth 2 (two) times daily. 10 day course. 04/03/23  Yes Clark, Meghan R, PA-C  folic acid (FOLVITE) 1 MG tablet Take 1 tablet (1 mg total) by mouth daily. 03/21/23  Yes Merrilyn Puma, MD  levETIRAcetam (KEPPRA) 500 MG tablet Take 1 tablet (500 mg total) by mouth 2 (two) times daily. 12/07/22  Yes McCue, Shanda Bumps, NP  midodrine (PROAMATINE) 2.5 MG tablet Take 1 tablet (2.5 mg total) by mouth 3 (three) times daily with meals. 01/06/23 07/05/23 Yes Merrilyn Puma, MD  Multiple Vitamin (MULTIVITAMIN WITH MINERALS) TABS tablet Take 1 tablet by mouth daily. Patient taking differently: Take 1 tablet by mouth daily with breakfast. 09/20/22  Yes Doran Stabler, DO  Tiotropium Bromide-Olodaterol (STIOLTO RESPIMAT) 2.5-2.5 MCG/ACT AERS NEW PRESCRIPTION REQUEST: STIOLTO 2.5 MCG- INHALE TWO PUFFS BY MOUTH DAILY Patient taking differently: Inhale 2 each into the lungs daily. 11/29/22  Yes Martina Sinner, MD  albuterol (PROVENTIL) (2.5 MG/3ML) 0.083% nebulizer solution Take 3 mLs (2.5 mg total) by nebulization every 4 (four) hours as needed for wheezing or shortness of breath. Patient not taking: Reported on 04/13/2023 08/16/22   Steffanie Rainwater, MD     Critical care time: n/a    Karren Burly, MD See Loretha Stapler for contact info

## 2023-04-13 NOTE — Evaluation (Addendum)
Clinical/Bedside Swallow Evaluation Patient Details  Name: Todd Estrada MRN: [id number] Date of Birth: 1961-12-05  Today's Date: 04/13/2023 Time: SLP Start Time (ACUTE ONLY): 1115 SLP Stop Time (ACUTE ONLY): 1128 SLP Time Calculation (min) (ACUTE ONLY): 13 min  Past Medical History:  Past Medical History:  Diagnosis Date   Asthma    Atypical chest pain 08/13/2022   Community acquired pneumonia 09/14/2022   COPD (chronic obstructive pulmonary disease)    Essential hypertension 08/19/2021   GERD (gastroesophageal reflux disease)    HAP (hospital-acquired pneumonia) 09/16/2022   History of tracheostomy    03/09/22-04/11/22   HLD (hyperlipidemia)    Hypertension    Hypokalemia 08/13/2022   Hypomagnesemia 08/13/2022   Lung cancer    PAD (peripheral artery disease)    Seizures 06/02/2022   Sepsis 08/13/2022   Stroke 02/2022   Past Surgical History:  Past Surgical History:  Procedure Laterality Date   BRONCHIAL BIOPSY  05/30/2022   Procedure: BRONCHIAL BIOPSIES;  Surgeon: Todd Peer, MD;  Location: [facility] ENDOSCOPY;  Service: Pulmonary;;   BRONCHIAL BRUSHINGS  05/30/2022   Procedure: BRONCHIAL BRUSHINGS;  Surgeon: Todd Peer, MD;  Location: [facility] ENDOSCOPY;  Service: Pulmonary;;   BRONCHIAL NEEDLE ASPIRATION BIOPSY  05/30/2022   Procedure: BRONCHIAL NEEDLE ASPIRATION BIOPSIES;  Surgeon: Todd Peer, MD;  Location: [facility] ENDOSCOPY;  Service: Pulmonary;;   BRONCHIAL WASHINGS  05/30/2022   Procedure: BRONCHIAL WASHINGS;  Surgeon: Todd Peer, MD;  Location: [facility] ENDOSCOPY;  Service: Pulmonary;;   ESOPHAGOGASTRODUODENOSCOPY (EGD) WITH PROPOFOL N/A 03/11/2022   Procedure: ESOPHAGOGASTRODUODENOSCOPY (EGD) WITH PROPOFOL;  Surgeon: Todd Gelinas, MD;  Location: [facility] ENDOSCOPY;  Service: General;  Laterality: N/A;   FIDUCIAL MARKER PLACEMENT  05/30/2022   Procedure: FIDUCIAL MARKER PLACEMENT;  Surgeon: Todd Peer, MD;  Location: [facility] ENDOSCOPY;  Service: Pulmonary;;   IR ANGIO INTRA  EXTRACRAN SEL COM CAROTID INNOMINATE UNI R MOD SED  03/02/2022   IR CT HEAD LTD  03/02/2022   IR PERCUTANEOUS ART THROMBECTOMY/INFUSION INTRACRANIAL INC DIAG ANGIO  03/02/2022   PEG PLACEMENT N/A 03/11/2022   Procedure: PERCUTANEOUS ENDOSCOPIC GASTROSTOMY (PEG) PLACEMENT;  Surgeon: Todd Gelinas, MD;  Location: [facility] ENDOSCOPY;  Service: General;  Laterality: N/A;   RADIOLOGY WITH ANESTHESIA N/A 03/02/2022   Procedure: IR WITH ANESTHESIA;  Surgeon: Todd Cotton, MD;  Location: [facility] OR;  Service: Radiology;  Laterality: N/A;   VIDEO BRONCHOSCOPY WITH RADIAL ENDOBRONCHIAL ULTRASOUND  05/30/2022   Procedure: VIDEO BRONCHOSCOPY WITH RADIAL ENDOBRONCHIAL ULTRASOUND;  Surgeon: Todd Peer, MD;  Location: [facility] ENDOSCOPY;  Service: Pulmonary;;   HPI:  Todd Estrada is a 62 y.o. 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  Necrotizing PNA of LUL concerning for post obstructive PNA late last year.  Stroke with R sided deficits, seizures, and aphasia, HFrEF.    Assessment / Plan / Recommendation  Clinical Impression  Pt demonstrates aphasia, but no overt dysphagia other than missing dentition. He was able to masticate graham crackers. When showed a picture of whole chicken and asked if he could chew it he shook his head no. When showed a photo of shredded chicken and asked if he could chew it pt nodded yes. Rn confirmed that pt toelrated Dys 3 breakfast well and took pills without difficulty. Continue current diet. Pt with baseline chronic aphasia, continue treatment at prior level of care. Will sign off. SLP Visit Diagnosis: Dysphagia, unspecified (R13.10)    Aspiration Risk  Mild aspiration risk    Diet Recommendation Dysphagia 3 (Mech soft);Thin liquid   Liquid Administration via: Cup;Straw Medication Administration: Whole meds with liquid Supervision: Patient able to self feed Postural Changes: Seated upright at 90 degrees    Other   Recommendations Oral Care Recommendations: Oral care BID    Recommendations for follow up therapy are one component of a multi-disciplinary discharge planning process, led by the attending physician.  Recommendations may be updated based on patient status, additional functional criteria and insurance authorization.  Follow up Recommendations No SLP follow up      Assistance Recommended at Discharge    Functional Status Assessment    Frequency and Duration            Prognosis        Swallow Study   General HPI: Todd Estrada is a 62 y.o. 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  Necrotizing PNA of LUL concerning for post obstructive PNA late last year.  Stroke with R sided deficits, seizures, and aphasia, HFrEF. Type of Study: Bedside Swallow Evaluation Diet Prior to this Study: Dysphagia 3 (mechanical soft);Thin liquids (Level 0) Temperature Spikes Noted: No History of Recent Intubation: No Behavior/Cognition: Alert;Cooperative;Pleasant mood Oral Cavity Assessment: Within Functional Limits Oral Care Completed by SLP: No Oral Cavity - Dentition: Edentulous Vision: Functional for self-feeding Self-Feeding Abilities: Able to feed self Patient Positioning: Upright in bed Baseline Vocal Quality: Normal Volitional Cough: Strong Volitional Swallow: Able to elicit    Oral/Motor/Sensory Function Overall Oral Motor/Sensory Function: Mild impairment Facial ROM: Reduced right;Suspected CN VII (facial) dysfunction Facial Symmetry: Abnormal symmetry right;Suspected CN VII (facial) dysfunction   Ice Chips     Thin Liquid Thin Liquid: Within functional limits Presentation: Straw;Self Fed    Nectar Thick Nectar Thick Liquid: Not tested   Honey Thick Honey Thick Liquid: Not tested   Puree Puree: Within functional limits Presentation: Spoon;Self Fed   Solid     Solid: Impaired Presentation: Self Fed Oral Phase Impairments:  Impaired mastication Oral Phase Functional Implications: Impaired mastication      Todd Estrada, Todd Estrada 04/13/2023,1:01 PM

## 2023-04-13 NOTE — Assessment & Plan Note (Addendum)
No longer on chemo at this point and is a DNR. Looks to me like pulmonary masses were present on CT chest x5 days ago though they called it "scarring" and "unchanged" from priors. Is radiologist sure that he doesn't have a cavitary lung mass in his RUL? And areas of spiculation / calcification just posterior and superior to said mass? Getting pulm consult in AM for second opinion on this, but I'm thinking he's got post-obstructive RUL PNA today due to possible recurrence of NSCLC. Given lack of positive CT read at this point, I did NOT discuss my concerns regarding NSCLC recurrence with patient yet.  Would like pulm to review first.

## 2023-04-14 ENCOUNTER — Inpatient Hospital Stay (HOSPITAL_COMMUNITY): Payer: Medicaid Other

## 2023-04-14 DIAGNOSIS — J189 Pneumonia, unspecified organism: Secondary | ICD-10-CM | POA: Diagnosis not present

## 2023-04-14 DIAGNOSIS — S31819D Unspecified open wound of right buttock, subsequent encounter: Secondary | ICD-10-CM | POA: Diagnosis not present

## 2023-04-14 LAB — LEGIONELLA PNEUMOPHILA SEROGP 1 UR AG: L. pneumophila Serogp 1 Ur Ag: NEGATIVE

## 2023-04-14 LAB — CULTURE, BLOOD (ROUTINE X 2): Culture: NO GROWTH

## 2023-04-14 NOTE — Progress Notes (Signed)
   04/14/23 1131  What Happened  Was fall witnessed? Yes  Who witnessed fall? Arna Snipe  Patients activity before fall bathroom-assisted  Point of contact head  Was patient injured? Unsure  Provider Notification  Provider Name/Title Gherghe  Date Provider Notified 04/14/23  Time Provider Notified 1125  Method of Notification Call  Notification Reason Fall  Provider response See new orders  Date of Provider Response 04/14/23  Time of Provider Response 1125  Follow Up  Family notified Yes - comment  Time family notified 1155  Additional tests Yes-comment (Head CT)  Progress note created (see row info) Yes  Vitals  Temp 98.1 F (36.7 C)  Temp Source Oral  BP 134/83  MAP (mmHg) 99  BP Location Left Arm  BP Method Automatic  Patient Position (if appropriate) Lying  Pulse Rate 91  Pulse Rate Source Monitor  Resp 20  Oxygen Therapy  SpO2 97 %  O2 Device Nasal Cannula  O2 Flow Rate (L/min) 2 L/min  Pain Assessment  Pain Scale PAINAD  PAINAD (Pain Assessment in Advanced Dementia)  Breathing 0  Negative Vocalization 0  Facial Expression 0  Body Language 0  Consolability 0  PAINAD Score 0  PCA/Epidural/Spinal Assessment  Respiratory Pattern Regular;Unlabored  Neurological  Neuro (WDL) X  Level of Consciousness Alert  Cognition Appropriate attention/concentration  Speech Slurred/Dysarthria;Incomprehensible  Glasgow Coma Scale  Eye Opening 4  Best Verbal Response (NON-intubated) 2  Best Motor Response 6  Musculoskeletal  Musculoskeletal (WDL) X  Generalized Weakness Yes  Weight Bearing Restrictions No  Musculoskeletal Details  RUE Paralysis  RLE Limited movement  Integumentary  Integumentary (WDL) X (No changes from previoius assessment)

## 2023-04-14 NOTE — Evaluation (Addendum)
Physical Therapy Evaluation Patient Details Name: Todd Estrada MRN: [id number] DOB: Jan 01, 1961 Today's Date: 04/14/2023  History of Present Illness  62 y.o. 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  Necrotizing PNA of LUL concerning for post obstructive PNA late last year.  Stroke with R sided deficits, seizures, and aphasia, HFrEF. Pt admitted 04/11/25 with chest pain. Dx of PNA, sepsis, SIRS.  Clinical Impression  Pt admitted with above diagnosis. Min/guard assist for stand pivot transfer to recliner. Prior level of function and home set up information limited due to pt's expressive aphasia. He was able to appropriately answer yes/no questions. He reports he uses a WC at baseline and is non-ambulatory. He appears to be near baseline with mobility. He reports he has assistance available at home. Pt did require assistance to feed himself as he was unable to use utensils with his LUE successfully.  Pt currently with functional limitations due to the deficits listed below (see PT Problem List). Pt will benefit from acute skilled PT to increase their independence and safety with mobility to allow discharge.          Recommendations for follow up therapy are one component of a multi-disciplinary discharge planning process, led by the attending physician.  Recommendations may be updated based on patient status, additional functional criteria and insurance authorization.  Follow Up Recommendations       Assistance Recommended at Discharge Intermittent Supervision/Assistance  Patient can return home with the following  A lot of help with bathing/dressing/bathroom;A little help with walking and/or transfers;Assistance with cooking/housework;Assist for transportation;Help with stairs or ramp for entrance;Direct supervision/assist for medications management;Assistance with feeding    Equipment Recommendations None recommended by PT   Recommendations for Other Services       Functional Status Assessment Patient has had a recent decline in their functional status and demonstrates the ability to make significant improvements in function in a reasonable and predictable amount of time.     Precautions / Restrictions Precautions Precautions: Fall Restrictions Weight Bearing Restrictions: No      Mobility  Bed Mobility Overal bed mobility: Modified Independent             General bed mobility comments: HOB up, pulled up using bedrail    Transfers Overall transfer level: Needs assistance Equipment used: 1 person hand held assist Transfers: Sit to/from Stand, Bed to chair/wheelchair/BSC Sit to Stand: Min guard Stand pivot transfers: Min guard         General transfer comment: used bedrail and armrest of chair for UE support for sit to stand and during pivot to recliner    Ambulation/Gait               General Gait Details: deferred, pt reports he does not walk at baseline, uses a WC  Stairs            Wheelchair Mobility    Modified Rankin (Stroke Patients Only)       Balance Overall balance assessment: Needs assistance Sitting-balance support: Feet supported, No upper extremity supported Sitting balance-Leahy Scale: Good     Standing balance support: Single extremity supported, During functional activity Standing balance-Leahy Scale: Poor                               Pertinent Vitals/Pain Pain Assessment Pain Assessment: No/denies pain    Home Living Family/patient expects to be discharged  to:: Private residence Living Arrangements: Alone                 Additional Comments: no family present, pt has expressive aphasia, stated "yes" when asked if there's someone at home with him at all times, pt not able to provide further details. Prior PT notes state pt lives with a cousin.    Prior Function Prior Level of Function : Patient poor historian/Family  not available             Mobility Comments: pt reports he uses a WC for mobility ADLs Comments: reports he needs help for feeding     Hand Dominance        Extremity/Trunk Assessment   Upper Extremity Assessment Upper Extremity Assessment: RUE deficits/detail;LUE deficits/detail RUE Deficits / Details: no active movement, h/o R HP 2* CVA LUE Deficits / Details: pt was unable to successfully use fork/spoon in LUE to feed himself, the food all fell off the utensil, PT assisted pt to eat breakfast, nursing notified pt needs assist to feed    Lower Extremity Assessment Lower Extremity Assessment: RLE deficits/detail RLE Deficits / Details: knee ext 2/5, per prior PT notes pt has an AFO RLE Sensation: decreased light touch       Communication   Communication: Expressive difficulties  Cognition Arousal/Alertness: Awake/alert Behavior During Therapy: WFL for tasks assessed/performed Overall Cognitive Status: Difficult to assess                                 General Comments: pt answered yes/no questions appropriately and followed 1 step commands consistently. No family present. Pt able to point to items he wanted.        General Comments      Exercises     Assessment/Plan    PT Assessment Patient needs continued PT services  PT Problem List Decreased strength;Decreased activity tolerance;Decreased mobility;Decreased balance       PT Treatment Interventions Functional mobility training;Therapeutic activities;Therapeutic exercise    PT Goals (Current goals can be found in the Care Plan section)  Acute Rehab PT Goals PT Goal Formulation: Patient unable to participate in goal setting Time For Goal Achievement: 04/28/23 Potential to Achieve Goals: Good    Frequency Min 1X/week     Co-evaluation               AM-PAC PT "6 Clicks" Mobility  Outcome Measure Help needed turning from your back to your side while in a flat bed without using  bedrails?: None Help needed moving from lying on your back to sitting on the side of a flat bed without using bedrails?: A Little Help needed moving to and from a bed to a chair (including a wheelchair)?: A Little Help needed standing up from a chair using your arms (e.g., wheelchair or bedside chair)?: A Little Help needed to walk in hospital room?: Total Help needed climbing 3-5 steps with a railing? : Total 6 Click Score: 15    End of Session Equipment Utilized During Treatment: Gait belt Activity Tolerance: Patient tolerated treatment well Patient left: in chair;with chair alarm set;with call bell/phone within reach Nurse Communication: Mobility status;Other (comment) (pt needs assist to feed himself) PT Visit Diagnosis: Hemiplegia and hemiparesis;Muscle weakness (generalized) (M62.81);Unsteadiness on feet (R26.81);Other abnormalities of gait and mobility (R26.89) Hemiplegia - Right/Left: Right    Time: 1610-9604 PT Time Calculation (min) (ACUTE ONLY): 26 min   Charges:  PT Evaluation $PT Eval Moderate Complexity: 1 Mod  Therapeutic exercise 8-22 min      Ralene Bathe Kistler PT 04/14/2023  Acute Rehabilitation Services  Office [phone]

## 2023-04-14 NOTE — Progress Notes (Signed)
PROGRESS NOTE  Todd Estrada ZOX:[id number] DOB: [birth year] DOA: 04/12/2023 PCP: Merrilyn Puma, MD   LOS: 1 day   Brief Narrative / Interim history: 62 year old male with history of COPD, squamous cell lung cancer status postradiation chemo, left upper lobe necrotizing pneumonia in the past status posttreatment, prior CVA with right-sided deficits and aphasia, chronic systolic CHF comes into the hospital with chest pain. Imaging in the ER was concerning for right upper lobe pneumonia, and he was started on antibiotics   Subjective / 24h Interval events: He tells me he is doing well, feels better, has less chest pain and less shortness of breath.  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  He was started on azithromycin and ceftriaxone, continue.  Fever curve improving, 100 last night, 99 this morning.  Continue antibiotics, once fever resolves could potentially return home  Active problems History of PE-continue Eliquis  History of lung cancer -no longer getting treatment, status post chemotherapy and radiation therapy.  Discussed with Dr. Arbutus Ped, will have follow-up as an outpatient following treatment for his pneumonia  Right buttock wound, POA -wound care consult  Prior CVA with aphasia -seems to be at baseline, wheelchair/bedbound  History of seizures -continue Keppra  Scheduled  Meds:  apixaban  5 mg Oral BID   levETIRAcetam  500 mg Oral BID   Continuous Infusions:  azithromycin Stopped (04/13/23 1131)   cefTRIAXone (ROCEPHIN)  IV Stopped (04/13/23 1058)   PRN Meds:.albuterol, morphine injection  Current Outpatient Medications  Medication Instructions   albuterol (PROVENTIL) 2.5 mg, Nebulization, Every 4 hours PRN   apixaban (ELIQUIS) 5 MG TABS tablet TAKE 1 TABLET(5 MG) BY MOUTH TWICE DAILY   atorvastatin (LIPITOR) 40 mg, Oral, Daily   budesonide (PULMICORT) 0.5 MG/2ML nebulizer solution NEW PRESCRIPTION REQEUST: BUDESONIDE 0.5 MG/ - USE ONE VIAL TWICE DAILY   clopidogrel (PLAVIX) 75 mg, Oral, Daily   doxycycline (VIBRAMYCIN) 100 mg, Oral, 2 times daily   folic acid (FOLVITE) 1 mg, Oral, Daily   levETIRAcetam (KEPPRA) 500 mg, Oral, 2 times daily   midodrine (PROAMATINE) 2.5 mg, Oral, 3 times daily with meals   Multiple Vitamin (MULTIVITAMIN WITH MINERALS) TABS tablet 1 tablet, Oral, Daily   Tiotropium Bromide-Olodaterol (STIOLTO RESPIMAT) 2.5-2.5 MCG/ACT AERS NEW PRESCRIPTION REQUEST: STIOLTO 2.5 MCG- INHALE TWO PUFFS BY MOUTH DAILY    Diet Orders (From admission, onward)     Start     Ordered   04/13/23 0259  DIET DYS 3 Room service appropriate? Yes; Fluid consistency: Thin  Diet effective now       Question Answer Comment  Room service appropriate? Yes   Fluid consistency: Thin      04/13/23 0300            DVT prophylaxis:  apixaban (ELIQUIS) tablet 5 mg   Lab Results  Component Value Date   PLT 150 04/13/2023  Code Status: DNR  Family Communication: called cousin, no answer  Status is: Inpatient Remains inpatient appropriate because: fever, iv antibiotics  Level of care: Telemetry  Consultants:  Pulmonary   Objective: Vitals:   04/13/23 1807 04/13/23 2022 04/14/23 0605 04/14/23 0753  BP: 137/70 110/67 109/64   Pulse: 84 84 74   Resp: 18 17 17 19   Temp: 99.8 F (37.7 C) 100.1 F (37.8 C) 99 F (37.2 C)    TempSrc: Oral Oral Oral   SpO2: 100% 97% 97%   Weight:      Height:        Intake/Output Summary (Last 24 hours) at 04/14/2023 1001 Last data filed at 04/14/2023 1610 Gross per 24 hour  Intake 3614.57 ml  Output 2275 ml  Net 1339.57 ml   Wt Readings from Last 3 Encounters:  04/12/23 73.5 kg  04/10/23 73.5 kg  04/03/23 74.8 kg    Examination:  Constitutional: NAD Eyes: no scleral icterus ENMT: Mucous membranes are moist.  Neck: normal, supple Respiratory: clear to auscultation bilaterally, no wheezing, no crackles. Normal respiratory effort. No accessory muscle use.  Cardiovascular: Regular rate and rhythm, no murmurs / rubs / gallops. No LE edema.  Abdomen: non distended, no tenderness. Bowel sounds positive.  Musculoskeletal: no clubbing / cyanosis.   Data Reviewed: I have independently reviewed following labs and imaging studies   CBC Recent Labs  Lab 04/08/23 1135 04/12/23 2355 04/13/23 0605  WBC 6.1 10.2 8.4  HGB 12.3* 10.4* 9.7*  HCT 39.7 32.4* 31.1*  PLT 184 182 150  MCV 95.0 92.3 95.1  MCH 29.4 29.6 29.7  MCHC 31.0 32.1 31.2  RDW 13.0 13.2 13.1  LYMPHSABS 0.5*  --   --   MONOABS 0.6  --   --   EOSABS 0.1  --   --   BASOSABS 0.0  --   --     Recent Labs  Lab 04/08/23 1135 04/12/23 2355 04/13/23 0019 04/13/23 0043 04/13/23 0254 04/13/23 0605  NA 139 136  --   --   --  140  K 3.9 4.1  --   --   --  3.7  CL 101 99  --   --   --  102  CO2 30 28  --   --   --  29  GLUCOSE 100* 125*  --   --   --  113*  BUN 17 19  --   --   --  17  CREATININE 0.84 0.77  --   --   --  0.78  CALCIUM 9.4 8.7*  --   --   --  8.5*  AST  --   --  16  --   --   --   ALT  --   --  11  --   --   --   ALKPHOS  --   --  81  --   --   --   BILITOT  --   --  0.5  --   --   --   ALBUMIN  --   --  3.1*  --   --   --   LATICACIDVEN  --   --   --  1.5 1.0  --   INR  --   --  1.2  --   --   --   BNP 24.1  --   --   --   --   --      ------------------------------------------------------------------------------------------------------------------  No results for input(s): "CHOL", "HDL", "LDLCALC", "TRIG", "CHOLHDL", "LDLDIRECT" in the last 72 hours.  Lab Results  Component Value Date   HGBA1C 5.4 03/03/2022   ------------------------------------------------------------------------------------------------------------------ No results for input(s): "TSH", "T4TOTAL", "T3FREE", "THYROIDAB" in the last 72 hours.  Invalid input(s): "FREET3"  Cardiac Enzymes No results for input(s): "CKMB", "TROPONINI", "MYOGLOBIN" in the last 168 hours.  Invalid input(s): "CK" ------------------------------------------------------------------------------------------------------------------    Component Value Date/Time   BNP 24.1 04/08/2023 1135    CBG: No results for input(s): "GLUCAP" in the last 168 hours.  Recent Results (from the past 240 hour(s))  Resp panel by RT-PCR (RSV, Flu A&B, Covid) Anterior Nasal Swab     Status: None   Collection Time: 04/13/23 12:43 AM   Specimen: Anterior Nasal Swab  Result Value Ref Range Status   SARS Coronavirus 2 by RT PCR NEGATIVE NEGATIVE Final    Comment: (NOTE) SARS-CoV-2 target nucleic acids are NOT DETECTED.  The SARS-CoV-2 RNA is generally detectable in upper respiratory specimens during the acute phase of infection. The lowest concentration of SARS-CoV-2 viral copies this assay can detect is 138 copies/mL. A negative result does not preclude SARS-Cov-2 infection and should not be used as the sole basis for treatment or other patient management decisions. A negative result may occur with  improper specimen collection/handling, submission of specimen other than nasopharyngeal swab, presence of viral mutation(s) within the areas targeted by this assay, and inadequate number of viral copies(<138 copies/mL). A negative result must be combined with clinical observations, patient  history, and epidemiological information. The expected result is Negative.  Fact Sheet for Patients:  BloggerCourse.com  Fact Sheet for Healthcare Providers:  SeriousBroker.it  This test is no t yet approved or cleared by the [place] FDA and  has been authorized for detection and/or diagnosis of SARS-CoV-2 by FDA under an Emergency Use Authorization (EUA). This EUA will remain  in effect (meaning this test can be used) for the duration of the COVID-19 declaration under Section 564(b)(1) of the Act, 21 U.S.C.section 360bbb-3(b)(1), unless the authorization is terminated  or revoked sooner.       Influenza A by PCR NEGATIVE NEGATIVE Final   Influenza B by PCR NEGATIVE NEGATIVE Final    Comment: (NOTE) The Xpert Xpress SARS-CoV-2/FLU/RSV plus assay is intended as an aid in the diagnosis of influenza from Nasopharyngeal swab specimens and should not be used as a sole basis for treatment. Nasal washings and aspirates are unacceptable for Xpert Xpress SARS-CoV-2/FLU/RSV testing.  Fact Sheet for Patients: BloggerCourse.com  Fact Sheet for Healthcare Providers: SeriousBroker.it  This test is not yet approved or cleared by the [place] FDA and has been authorized for detection and/or diagnosis of SARS-CoV-2 by FDA under an Emergency Use Authorization (EUA). This EUA will remain in effect (meaning this test can be used) for the duration of the COVID-19 declaration under Section 564(b)(1) of the Act, 21 U.S.C. section 360bbb-3(b)(1), unless the authorization is terminated or revoked.     Resp Syncytial Virus by PCR NEGATIVE NEGATIVE Final    Comment: (NOTE) Fact Sheet for Patients: BloggerCourse.com  Fact Sheet for Healthcare Providers: SeriousBroker.it  This test is not yet approved or cleared by the [place] FDA  and has been authorized for detection and/or diagnosis of SARS-CoV-2 by FDA under an Emergency Use Authorization (EUA). This EUA will remain in effect (meaning this test can be used) for the duration of the COVID-19 declaration under Section 564(b)(1) of the Act, 21 U.S.C. section 360bbb-3(b)(1), unless  the authorization is terminated or revoked.  Performed at [facility], [address] [place], [place] [address]   Blood Culture (routine x 2)     Status: None (Preliminary result)   Collection Time: 04/13/23 12:45 AM   Specimen: BLOOD  Result Value Ref Range Status   Specimen Description   Final    BLOOD BLOOD LEFT ARM Performed at [facility], [address], [place], [place] [address]    Special Requests   Final    BOTTLES DRAWN AEROBIC AND ANAEROBIC Blood Culture adequate volume Performed at [facility], [address], [place], [place] [address]    Culture   Final    NO GROWTH 1 DAY Performed at [facility], [address] [place], [place] [address]    Report Status PENDING  Incomplete  Blood Culture (routine x 2)     Status: None (Preliminary result)   Collection Time: 04/13/23  2:48 AM   Specimen: BLOOD  Result Value Ref Range Status   Specimen Description   Final    BLOOD BLOOD RIGHT HAND Performed at [facility], [address], [place], [place] [address]    Special Requests   Final    BOTTLES DRAWN AEROBIC AND ANAEROBIC Blood Culture adequate volume Performed at [facility], [address], [place], [place] [address]    Culture   Final    NO GROWTH 1 DAY Performed at [facility], [address] [place], [place] [address]    Report Status PENDING  Incomplete  Respiratory (~20 pathogens) panel by PCR     Status: Abnormal   Collection Time: 04/13/23  4:28 AM   Specimen: Nasopharyngeal Swab; Respiratory  Result Value Ref Range Status   Adenovirus  NOT DETECTED NOT DETECTED Final   Coronavirus 229E DETECTED (A) NOT DETECTED Final    Comment: (NOTE) The Coronavirus on the Respiratory Panel, DOES NOT test for the novel  Coronavirus (2019 nCoV)    Coronavirus HKU1 NOT DETECTED NOT DETECTED Final   Coronavirus NL63 NOT DETECTED NOT DETECTED Final   Coronavirus OC43 NOT DETECTED NOT DETECTED Final   Metapneumovirus NOT DETECTED NOT DETECTED Final   Rhinovirus / Enterovirus NOT DETECTED NOT DETECTED Final   Influenza A NOT DETECTED NOT DETECTED Final   Influenza B NOT DETECTED NOT DETECTED Final   Parainfluenza Virus 1 NOT DETECTED NOT DETECTED Final   Parainfluenza Virus 2 NOT DETECTED NOT DETECTED Final   Parainfluenza Virus 3 NOT DETECTED NOT DETECTED Final   Parainfluenza Virus 4 NOT DETECTED NOT DETECTED Final   Respiratory Syncytial Virus NOT DETECTED NOT DETECTED Final   Bordetella pertussis NOT DETECTED NOT DETECTED Final   Bordetella Parapertussis NOT DETECTED NOT DETECTED Final   Chlamydophila pneumoniae NOT DETECTED NOT DETECTED Final   Mycoplasma pneumoniae NOT DETECTED NOT DETECTED Final    Comment: Performed at [facility], [address] [place], [place] [address]     Radiology Studies: No results found.   Pamella Pert, MD, PhD [facility]  Between 7 am - 7 pm I am available, please contact me via Amion (for emergencies) or Securechat (non urgent messages)  Between 7 pm - 7 am I am not available, please contact night coverage MD/APP via Amion

## 2023-04-14 NOTE — TOC Progression Note (Signed)
Transition of Care [facility]) - Progression Note    Patient Details  Name: Todd Estrada MRN: [id number] Date of Birth: October 27, 1961  Transition of [facility]) CM/SW Contact  Howell Rucks, RN Phone Number: 04/14/2023, 3:24 PM  Clinical Narrative:   NCM outreached to alternate contact on file, Todd Estrada (cousin) to inquire about pt's living arrangements and wound care provider,  left vm x 3 with no return call at this time. NCM call to floor to speak with Nurse, confirmed pt has no visitors to her knowledge. Will continue to follow.     Expected Discharge Plan: Home/Self Care Barriers to Discharge: Continued Medical Work up  Expected Discharge Plan and Services   Discharge Planning Services: CM Consult   Living arrangements for the past 2 months: Single Family Home                                       Social Determinants of Health (SDOH) Interventions SDOH Screenings   Food Insecurity: No Food Insecurity (04/14/2023)  Housing: Low Risk  (04/14/2023)  Transportation Needs: No Transportation Needs (04/14/2023)  Utilities: Not At Risk (04/14/2023)  Depression (PHQ2-9): Low Risk  (04/10/2023)  Social Connections: Socially Isolated (01/06/2023)  Tobacco Use: Medium Risk (04/13/2023)    Readmission Risk Interventions    08/30/2022    3:39 PM 08/16/2022    4:28 PM  Readmission Risk Prevention Plan  Transportation Screening Complete Complete  Medication Review (RN Care Manager) Complete Complete  PCP or Specialist appointment within 3-5 days of discharge Complete   HRI or Home Care Consult Complete Complete  SW Recovery Care/Counseling Consult Complete Complete  Palliative Care Screening Not Applicable Not Applicable  Skilled Nursing Facility Not Applicable Not Applicable

## 2023-04-15 DIAGNOSIS — J189 Pneumonia, unspecified organism: Secondary | ICD-10-CM | POA: Diagnosis not present

## 2023-04-15 LAB — CULTURE, BLOOD (ROUTINE X 2): Special Requests: ADEQUATE

## 2023-04-15 MED ORDER — AMOXICILLIN-POT CLAVULANATE 875-125 MG PO TABS: 1.0000 | ORAL_TABLET | Freq: Two times a day (BID) | ORAL | 0 refills | Status: AC

## 2023-04-15 NOTE — Discharge Summary (Signed)
Physician Discharge Summary  Todd Estrada WUJ:[id number] DOB: 1961-11-27 DOA: 04/12/2023  PCP: Merrilyn Puma, MD  Admit date: 04/12/2023 Discharge date: 04/15/2023  Admitted From: home Disposition:  home  Recommendations for Outpatient Follow-up:  Follow up with PCP in 1-2 weeks  Home Health: none Equipment/Devices: none  Discharge Condition: stable CODE STATUS: DNR Diet Orders (From admission, onward)     Start     Ordered   04/13/23 0259  DIET DYS 3 Room service appropriate? Yes; Fluid consistency: Thin  Diet effective now       Question Answer Comment  Room service appropriate? Yes   Fluid consistency: Thin      04/13/23 0300            HPI: Per admitting MD, Todd Estrada is a 62 y.o. 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  Necrotizing PNA of LUL concerning for post obstructive PNA late last year.  Stroke with R sided deficits, seizures, and aphasia, HFrEF. Pt in to ED today with c/o CP.  Pt unable to communicate past yes/no questions really due to his severe but baseline aphasia. Chemo and radiation in past but not currently on chemo. Family confirms DNR status over phone with EDP. Per family, pt with CP for ~past week.  In to ED today.  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  He was started on azithromycin and ceftriaxone, with improvement in his respiratory status.  He is now  afebrile, white count is normal, will be transition to Augmentin and discharged home in stable condition.  Still has very mild pleuritic type chest pain which can be seen in the setting of pneumonia, he ruled out for ACS with negative high-sensitivity troponins and ruled out for PE with a negative CT angiogram.   Active problems History of PE-continue Eliquis History of lung cancer -no longer getting treatment, status post chemotherapy and radiation therapy.  Discussed with Dr. Arbutus Ped, will have follow-up as an outpatient following treatment for his pneumonia Right buttock wound, POA -wound care consult Prior CVA with aphasia -seems to be at baseline, wheelchair/bedbound History of seizures -continue Keppra  Discharge Instructions   Allergies as of 04/15/2023   No Known Allergies      Medication List     STOP taking these medications    doxycycline 100 MG capsule Commonly known as: VIBRAMYCIN       TAKE these medications    albuterol (2.5 MG/3ML) 0.083% nebulizer solution Commonly known as: PROVENTIL Take 3 mLs (2.5 mg total) by nebulization every 4 (four) hours as needed for wheezing or shortness of breath.   amoxicillin-clavulanate 875-125 MG tablet Commonly known as: AUGMENTIN Take 1 tablet by mouth 2 (two) times daily for 5 days.   atorvastatin 40 MG tablet Commonly known as: Lipitor Take 1 tablet (40 mg total) by mouth daily.   budesonide 0.5 MG/2ML nebulizer solution Commonly known as: PULMICORT NEW  PRESCRIPTION REQEUST: BUDESONIDE 0.5 MG/ - USE ONE VIAL TWICE DAILY What changed:  how much to take how to take this when to take this additional instructions   clopidogrel 75 MG tablet Commonly known as: PLAVIX Take 75 mg by mouth daily.   Eliquis 5 MG Tabs tablet Generic drug: apixaban TAKE 1 TABLET(5 MG) BY MOUTH TWICE DAILY What changed: See the new instructions.   folic acid 1 MG tablet Commonly known as: FOLVITE Take 1 tablet (1 mg total) by  mouth daily.   levETIRAcetam 500 MG tablet Commonly known as: KEPPRA Take 1 tablet (500 mg total) by mouth 2 (two) times daily.   midodrine 2.5 MG tablet Commonly known as: PROAMATINE Take 1 tablet (2.5 mg total) by mouth 3 (three) times daily with meals.   multivitamin with minerals Tabs tablet Take 1 tablet by mouth daily. What changed: when to take this   Stiolto Respimat 2.5-2.5 MCG/ACT Aers Generic drug: Tiotropium Bromide-Olodaterol NEW PRESCRIPTION REQUEST: STIOLTO 2.5 MCG- INHALE TWO PUFFS BY MOUTH DAILY What changed:  how much to take how to take this when to take this additional instructions       Consultations: none  Procedures/Studies:  CT HEAD WO CONTRAST ( )  Result Date: 04/14/2023 CLINICAL DATA:  Fall EXAM: CT HEAD WITHOUT CONTRAST TECHNIQUE: Contiguous axial images were obtained from the base of the skull through the vertex without intravenous contrast. RADIATION DOSE REDUCTION: This exam was performed according to the departmental dose-optimization program which includes automated exposure control, adjustment of the mA and/or kV according to patient size and/or use of iterative reconstruction technique. COMPARISON:  CT head 04/13/23 FINDINGS: Brain: Redemonstrated is a large left MCA territory infarct with associated cortical laminar necrosis, unchanged from prior exam. There is also a chronic left cerebellar infarct. No hydrocephalus. No extra-axial fluid collection. No new hemorrhage. Vascular: No hyperdense vessel or unexpected calcification. Skull: Mild soft tissue thickening along the midline parietal scalp. No evidence of underlying calvarial fracture. Sinuses/Orbits: No middle ear effusion. Trace left mastoid effusion. Complete opacification of the right maxillary sinus. Mild mucosal thickening in the left maxillary sinus. Mild mucosal thickening in the right-sided ethmoid air cells. These findings are unchanged from recent prior CT head. Other: None.  IMPRESSION: 1. No acute intracranial abnormality. 2. Mild soft tissue thickening along the midline parietal scalp. No evidence of underlying calvarial fracture. Electronically Signed   By: Lorenza Cambridge M.D.   On: 04/14/2023 15:26   CT Head Wo Contrast  Result Date: 04/13/2023 CLINICAL DATA:  Mental status change, unknown cause. EXAM: CT HEAD WITHOUT CONTRAST TECHNIQUE: Contiguous axial images were obtained from the base of the skull through the vertex without intravenous contrast. RADIATION DOSE REDUCTION: This exam was performed according to the departmental dose-optimization program which includes automated exposure control, adjustment of the mA and/or kV according to patient size and/or use of iterative reconstruction technique. COMPARISON:  03/09/2023. FINDINGS: Brain: No acute hemorrhage, midline shift or mass effect. No extra-axial fluid collection. Diffuse atrophy is noted. There is encephalomalacia in the left cerebral hemisphere and left cerebral hemisphere. Periventricular white matter hypodensities are present bilaterally. No hydrocephalus. Vascular: No hyperdense vessel or unexpected calcification. Skull: Normal. Negative for fracture or focal lesion. Sinuses/Orbits: There is partial opacification of the ethmoid air cells and right maxillary sinus. No acute orbital abnormality. Other: None. IMPRESSION: Stable head CT with no acute intracranial process. Electronically Signed   By: Thornell Sartorius M.D.   On: 04/13/2023 01:32   DG Chest 2  View  Result Date: 04/13/2023 CLINICAL DATA:  History of lung cancer presenting with chest pain. EXAM: CHEST - 2 VIEW COMPARISON:  April 08, 2023 FINDINGS: The heart size and mediastinal contours are within normal limits. There is marked severity emphysematous lung disease. A stable area of coarse parenchymal opacification is seen within the left perihilar region. Moderate to marked severity focal opacification is also seen within the mid right lung. This is  increased in size when compared to the prior study. A small radiopaque surgical clip is also seen within the mid right lung. There is no evidence of a pleural effusion or pneumothorax. The visualized skeletal structures are unremarkable. IMPRESSION: 1. Marked severity emphysematous lung disease with stable left perihilar coarse scarring and/or atelectasis. 2. Additional areas of scarring and atelectasis within the right upper lobe, with interval development of superimposed moderate severity infiltrate when compared to the prior exam. Electronically Signed   By: Aram Candela M.D.   On: 04/13/2023 00:28   CT ABDOMEN PELVIS W CONTRAST  Result Date: 04/08/2023 CLINICAL DATA:  Left lower quadrant abdominal pain EXAM: CT ABDOMEN AND PELVIS WITH CONTRAST TECHNIQUE: Multidetector CT imaging of the abdomen and pelvis was performed using the standard protocol following bolus administration of intravenous contrast. RADIATION DOSE REDUCTION: This exam was performed according to the departmental dose-optimization program which includes automated exposure control, adjustment of the mA and/or kV according to patient size and/or use of iterative reconstruction technique. CONTRAST:  OMNIPAQUE IOHEXOL 350 MG/ML SOLN COMPARISON:  12/02/2022 FINDINGS: Lower chest: Areas of scarring in the lung bases. Mild elevation of the left hemidiaphragm. No acute findings. Hepatobiliary: No focal hepatic abnormality. Gallbladder unremarkable. Pancreas: No focal abnormality or ductal dilatation. Spleen: No focal abnormality.  Normal size. Adrenals/Urinary Tract: No adrenal abnormality. No focal renal abnormality. No stones or hydronephrosis. Urinary bladder is unremarkable. Stomach/Bowel: Stomach, large and small bowel grossly unremarkable. Vascular/Lymphatic: Aortic atherosclerosis. No evidence of aneurysm or adenopathy. Reproductive: No visible focal abnormality. Other: No free fluid or free air. Musculoskeletal: No acute bony  abnormality. IMPRESSION: No acute findings in the abdomen or pelvis. Aortic atherosclerosis. Electronically Signed   By: Charlett Nose M.D.   On: 04/08/2023 17:12   CT Angio Chest PE W/Cm &/Or Wo Cm  Result Date: 04/08/2023 CLINICAL DATA:  Pulmonary embolism (PE) suspected, high prob. Chest pain, shortness of breath EXAM: CT ANGIOGRAPHY CHEST WITH CONTRAST TECHNIQUE: Multidetector CT imaging of the chest was performed using the standard protocol during bolus administration of intravenous contrast. Multiplanar CT image reconstructions and MIPs were obtained to evaluate the vascular anatomy. RADIATION DOSE REDUCTION: This exam was performed according to the departmental dose-optimization program which includes automated exposure control, adjustment of the mA and/or kV according to patient size and/or use of iterative reconstruction technique. CONTRAST:  OMNIPAQUE IOHEXOL 350 MG/ML SOLN COMPARISON:  03/22/2023 FINDINGS: Cardiovascular: Heart is normal size. Aorta normal caliber. Moderate coronary artery and aortic calcifications. No filling defects in the pulmonary arteries to suggest pulmonary emboli. Mediastinum/Nodes: No mediastinal, hilar, or axillary adenopathy. Trachea and esophagus are unremarkable. Thyroid unremarkable. Lungs/Pleura: Moderate centrilobular emphysema. Areas of scarring in the mid lungs bilaterally, no new confluent airspace opacities. No effusions. Upper Abdomen: No acute findings Musculoskeletal: Chest wall soft tissues are unremarkable. No acute bony abnormality. Review of the MIP images confirms the above findings. IMPRESSION: No evidence of pulmonary embolus. Moderate coronary artery disease. Areas of scarring in the mid lungs bilaterally. No acute cardiopulmonary disease. Aortic Atherosclerosis (ICD10-I70.0) and Emphysema (ICD10-J43.9).  Electronically Signed   By: Charlett Nose M.D.   On: 04/08/2023 17:09   DG Chest Port 1 View  Result Date: 04/08/2023 CLINICAL DATA:  Chest pain  and shortness of breath. COPD. Lung carcinoma. EXAM: PORTABLE CHEST 1 VIEW COMPARISON:  03/30/2023 FINDINGS: The heart size and mediastinal contours are within normal limits. Coarse opacity is seen in the left perihilar region and lateral right midlung are unchanged in appearance, and consistent with chronic scarring/radiation changes. No evidence of acute infiltrate or pleural effusion. Pulmonary hyperinflation is again seen, consistent with COPD. IMPRESSION: No acute findings. Stable chronic scarring/radiation changes in left perihilar region and lateral right midlung. COPD. Electronically Signed   By: Danae Orleans M.D.   On: 04/08/2023 11:31   DG Ribs Unilateral W/Chest Right  Result Date: 03/30/2023 CLINICAL DATA:  History of lung cancer and COPD with right anterior upper rib pain EXAM: RIGHT RIBS AND CHEST - 2 VIEW COMPARISON:  Chest radiograph dated 03/22/2023 FINDINGS: No fracture or other bone lesions are seen involving the ribs. Metallic BB overlies an area of right mid lung opacity/scarring. Similar appearance of bilateral mid lung linear opacity/scarring. There is no evidence of pneumothorax or pleural effusion. Heart size and mediastinal contours are within normal limits. IMPRESSION: 1. No evidence of displaced rib fracture. 2. Metallic BB overlies an area of right mid lung opacity/scarring. Similar appearance of bilateral mid lung linear opacity/scarring. Electronically Signed   By: Agustin Cree M.D.   On: 03/30/2023 17:33   CT Chest Wo Contrast  Result Date: 03/22/2023 CLINICAL DATA:  Pneumonia complication. Shortness of breath and cough. EXAM: CT CHEST WITHOUT CONTRAST TECHNIQUE: Multidetector CT imaging of the chest was performed following the standard protocol without IV contrast. RADIATION DOSE REDUCTION: This exam was performed according to the departmental dose-optimization program which includes automated exposure control, adjustment of the mA and/or kV according to patient size and/or use  of iterative reconstruction technique. COMPARISON:  Chest CT dated 01/18/2023. FINDINGS: Evaluation of this exam is limited in the absence of intravenous contrast. Cardiovascular: There is no cardiomegaly or pericardial effusion. Three-vessel coronary vascular calcification. Mild atherosclerotic calcification of the thoracic aorta. No aneurysmal dilatation. The central pulmonary arteries are grossly unremarkable. Mediastinum/Nodes: No hilar or mediastinal adenopathy. The esophagus is grossly unremarkable. No mediastinal fluid collection. Lungs/Pleura: Background of emphysema. Bilateral streaky and reticular densities predominantly in the upper lobes extending from the hila to the pleural surface, new since the study of 01/18/2023, and most consistent with post inflammatory/infectious changes or developing scarring. Clinical correlation and follow-up recommended. Additional cluster of ground-glass nodularity in the inferior right middle lobe consistent with infiltrate. There is no pleural effusion or pneumothorax. A 5 mm nodule in the left lower lobe (97/8) appears new since the prior CT, likely post inflammatory. Attention on follow-up imaging recommended. There is no pleural effusion or pneumothorax. The central airways are patent. Upper Abdomen: No acute abnormality. Musculoskeletal: Osteopenia with degenerative changes of the spine. No acute osseous pathology. Indeterminate 2.5 x 2.0 cm ovoid hypodense lesion in the subcutaneous soft tissues of the posterior right upper chest wall. This may represent a lipoma and was present on the CT of 10/12/2022. Ultrasound may provide better evaluation on a nonemergent/outpatient basis. IMPRESSION: 1. Bilateral streaky and reticular densities predominantly in the upper lobes extending from the hila to the pleural surface, new since the study of 01/18/2023, and most consistent with post inflammatory/infectious changes or developing scarring. 2. Additional cluster of  ground-glass nodularity in the inferior  right middle lobe consistent with infiltrate. 3. A 5 mm nodule in the left lower lobe appears new since the prior CT, likely post inflammatory. Attention on follow-up imaging recommended. 4.  Aortic Atherosclerosis (ICD10-I70.0). Electronically Signed   By: Elgie Collard M.D.   On: 03/22/2023 21:44   DG Chest Portable 1 View  Result Date: 03/22/2023 CLINICAL DATA:  Chest pain EXAM: PORTABLE CHEST 1 VIEW COMPARISON:  03/09/2023, CT 01/18/2023, chest x-ray 01/29/2023 FINDINGS: Increased opacity in the right mid lung in the region of fiducial marker. Heterogeneous slightly bandlike opacity in the left mid lung. Emphysema. No pleural effusion. Stable cardiomediastinal silhouette. No pneumothorax. IMPRESSION: 1. Increased opacity in the right mid lung since prior exam. Persistent heterogeneous opacity in the left mid lung since most recent prior. Findings could be infectious or inflammatory in nature and CT follow-up could be considered for further assessment. 2. Emphysema Electronically Signed   By: Jasmine Pang M.D.   On: 03/22/2023 20:47    Subjective: - no chest pain, shortness of breath, no abdominal pain, nausea or vomiting.   Discharge Exam: BP 100/63 (BP Location: Left Arm)   Pulse 74   Temp 97.9 F (36.6 C) (Oral)   Resp 18   Ht 6' (1.829 m)   Wt 73.5 kg   SpO2 97%   BMI 21.98 kg/m   General: Pt is alert, awake, not in acute distress Cardiovascular: RRR, S1/S2 +, no rubs, no gallops Respiratory: CTA bilaterally, no wheezing, no rhonchi Abdominal: Soft, NT, ND, bowel sounds + Extremities: no edema, no cyanosis   The results of significant diagnostics from this hospitalization (including imaging, microbiology, ancillary and laboratory) are listed below for reference.     Microbiology: Recent Results (from the past 240 hour(s))  Resp panel by RT-PCR (RSV, Flu A&B, Covid) Anterior Nasal Swab     Status: None   Collection Time: 04/13/23  12:43 AM   Specimen: Anterior Nasal Swab  Result Value Ref Range Status   SARS Coronavirus 2 by RT PCR NEGATIVE NEGATIVE Final    Comment: (NOTE) SARS-CoV-2 target nucleic acids are NOT DETECTED.  The SARS-CoV-2 RNA is generally detectable in upper respiratory specimens during the acute phase of infection. The lowest concentration of SARS-CoV-2 viral copies this assay can detect is 138 copies/mL. A negative result does not preclude SARS-Cov-2 infection and should not be used as the sole basis for treatment or other patient management decisions. A negative result may occur with  improper specimen collection/handling, submission of specimen other than nasopharyngeal swab, presence of viral mutation(s) within the areas targeted by this assay, and inadequate number of viral copies(<138 copies/mL). A negative result must be combined with clinical observations, patient history, and epidemiological information. The expected result is Negative.  Fact Sheet for Patients:  BloggerCourse.com  Fact Sheet for Healthcare Providers:  SeriousBroker.it  This test is no t yet approved or cleared by the [place] FDA and  has been authorized for detection and/or diagnosis of SARS-CoV-2 by FDA under an Emergency Use Authorization (EUA). This EUA will remain  in effect (meaning this test can be used) for the duration of the COVID-19 declaration under Section 564(b)(1) of the Act, 21 U.S.C.section 360bbb-3(b)(1), unless the authorization is terminated  or revoked sooner.       Influenza A by PCR NEGATIVE NEGATIVE Final   Influenza B by PCR NEGATIVE NEGATIVE Final    Comment: (NOTE) The Xpert Xpress SARS-CoV-2/FLU/RSV plus assay is intended as an aid in the diagnosis of  influenza from Nasopharyngeal swab specimens and should not be used as a sole basis for treatment. Nasal washings and aspirates are unacceptable for Xpert Xpress  SARS-CoV-2/FLU/RSV testing.  Fact Sheet for Patients: BloggerCourse.com  Fact Sheet for Healthcare Providers: SeriousBroker.it  This test is not yet approved or cleared by the [place] FDA and has been authorized for detection and/or diagnosis of SARS-CoV-2 by FDA under an Emergency Use Authorization (EUA). This EUA will remain in effect (meaning this test can be used) for the duration of the COVID-19 declaration under Section 564(b)(1) of the Act, 21 U.S.C. section 360bbb-3(b)(1), unless the authorization is terminated or revoked.     Resp Syncytial Virus by PCR NEGATIVE NEGATIVE Final    Comment: (NOTE) Fact Sheet for Patients: BloggerCourse.com  Fact Sheet for Healthcare Providers: SeriousBroker.it  This test is not yet approved or cleared by the [place] FDA and has been authorized for detection and/or diagnosis of SARS-CoV-2 by FDA under an Emergency Use Authorization (EUA). This EUA will remain in effect (meaning this test can be used) for the duration of the COVID-19 declaration under Section 564(b)(1) of the Act, 21 U.S.C. section 360bbb-3(b)(1), unless the authorization is terminated or revoked.  Performed at [facility], [address], [place], [place] [address]   Blood Culture (routine x 2)     Status: None (Preliminary result)   Collection Time: 04/13/23 12:45 AM   Specimen: BLOOD  Result Value Ref Range Status   Specimen Description   Final    BLOOD BLOOD LEFT ARM Performed at [facility], [address], [place], [place] [address]    Special Requests   Final    BOTTLES DRAWN AEROBIC AND ANAEROBIC Blood Culture adequate volume Performed at [facility], [address] [place], [place] [address]    Culture   Final    NO GROWTH 2 DAYS Performed at [facility], [address]  [place], [place] [address]    Report Status PENDING  Incomplete  Blood Culture (routine x 2)     Status: None (Preliminary result)   Collection Time: 04/13/23  2:48 AM   Specimen: BLOOD  Result Value Ref Range Status   Specimen Description   Final    BLOOD BLOOD RIGHT HAND Performed at [facility], [address], [place], [place] [address]    Special Requests   Final    BOTTLES DRAWN AEROBIC AND ANAEROBIC Blood Culture adequate volume Performed at [facility], [address], [place], [place] [address]    Culture   Final    NO GROWTH 2 DAYS Performed at [facility], [address] [place], [place] [address]    Report Status PENDING  Incomplete  Respiratory (~20 pathogens) panel by PCR     Status: Abnormal   Collection Time: 04/13/23  4:28 AM   Specimen: Nasopharyngeal Swab; Respiratory  Result Value Ref Range Status   Adenovirus NOT DETECTED NOT DETECTED Final   Coronavirus 229E DETECTED (A) NOT DETECTED Final    Comment: (NOTE) The Coronavirus on the Respiratory Panel, DOES NOT test for the novel  Coronavirus (2019 nCoV)    Coronavirus HKU1 NOT DETECTED NOT DETECTED Final   Coronavirus NL63 NOT DETECTED NOT DETECTED Final   Coronavirus OC43 NOT DETECTED NOT DETECTED Final   Metapneumovirus NOT DETECTED NOT DETECTED Final   Rhinovirus / Enterovirus NOT DETECTED NOT DETECTED Final   Influenza A NOT DETECTED NOT DETECTED  Final   Influenza B NOT DETECTED NOT DETECTED Final   Parainfluenza Virus 1 NOT DETECTED NOT DETECTED Final   Parainfluenza Virus 2 NOT DETECTED NOT DETECTED Final   Parainfluenza Virus 3 NOT DETECTED NOT DETECTED Final   Parainfluenza Virus 4 NOT DETECTED NOT DETECTED Final   Respiratory Syncytial Virus NOT DETECTED NOT DETECTED Final   Bordetella pertussis NOT DETECTED NOT DETECTED Final   Bordetella Parapertussis NOT DETECTED NOT DETECTED Final   Chlamydophila pneumoniae NOT DETECTED NOT DETECTED Final    Mycoplasma pneumoniae NOT DETECTED NOT DETECTED Final    Comment: Performed at [facility], [address], [place], [place] [address]     Labs: Basic Metabolic Panel: Recent Labs  Lab 04/08/23 1135 04/12/23 2355 04/13/23 0605  NA 139 136 140  K 3.9 4.1 3.7  CL 101 99 102  CO2 30 28 29   GLUCOSE 100* 125* 113*  BUN 17 19 17   CREATININE 0.84 0.77 0.78  CALCIUM 9.4 8.7* 8.5*   Liver Function Tests: Recent Labs  Lab 04/13/23 0019  AST 16  ALT 11  ALKPHOS 81  BILITOT 0.5  PROT 7.1  ALBUMIN 3.1*   CBC: Recent Labs  Lab 04/08/23 1135 04/12/23 2355 04/13/23 0605  WBC 6.1 10.2 8.4  NEUTROABS 4.9  --   --   HGB 12.3* 10.4* 9.7*  HCT 39.7 32.4* 31.1*  MCV 95.0 92.3 95.1  PLT 184 182 150   CBG: No results for input(s): "GLUCAP" in the last 168 hours. Hgb A1c No results for input(s): "HGBA1C" in the last 72 hours. Lipid Profile No results for input(s): "CHOL", "HDL", "LDLCALC", "TRIG", "CHOLHDL", "LDLDIRECT" in the last 72 hours. Thyroid function studies No results for input(s): "TSH", "T4TOTAL", "T3FREE", "THYROIDAB" in the last 72 hours.  Invalid input(s): "FREET3" Urinalysis    Component Value Date/Time   COLORURINE YELLOW 04/13/2023 0233   APPEARANCEUR CLEAR 04/13/2023 0233   LABSPEC 1.014 04/13/2023 0233   PHURINE 7.0 04/13/2023 0233   GLUCOSEU NEGATIVE 04/13/2023 0233   HGBUR NEGATIVE 04/13/2023 0233   BILIRUBINUR NEGATIVE 04/13/2023 0233   KETONESUR NEGATIVE 04/13/2023 0233   PROTEINUR NEGATIVE 04/13/2023 0233   NITRITE NEGATIVE 04/13/2023 0233   LEUKOCYTESUR NEGATIVE 04/13/2023 0233    FURTHER DISCHARGE INSTRUCTIONS:   Get Medicines reviewed and adjusted: Please take all your medications with you for your next visit with your Primary MD   Laboratory/radiological data: Please request your Primary MD to go over all hospital tests and procedure/radiological results at the follow up, please ask your Primary MD to get all Hospital records sent  to his/her office.   In some cases, they will be blood work, cultures and biopsy results pending at the time of your discharge. Please request that your primary care M.D. goes through all the records of your hospital data and follows up on these results.   Also Note the following: If you experience worsening of your admission symptoms, develop shortness of breath, life threatening emergency, suicidal or homicidal thoughts you must seek medical attention immediately by calling 911 or calling your MD immediately  if symptoms less severe.   You must read complete instructions/literature along with all the possible adverse reactions/side effects for all the Medicines you take and that have been prescribed to you. Take any new Medicines after you have completely understood and accpet all the possible adverse reactions/side effects.    Do not drive when taking Pain medications or sleeping medications (Benzodaizepines)   Do not take more  than prescribed Pain, Sleep and Anxiety Medications. It is not advisable to combine anxiety,sleep and pain medications without talking with your primary care practitioner   Special Instructions: If you have smoked or chewed Tobacco  in the last 2 yrs please stop smoking, stop any regular Alcohol  and or any Recreational drug use.   Wear Seat belts while driving.   Please note: You were cared for by a hospitalist during your hospital stay. Once you are discharged, your primary care physician will handle any further medical issues. Please note that NO REFILLS for any discharge medications will be authorized once you are discharged, as it is imperative that you return to your primary care physician (or establish a relationship with a primary care physician if you do not have one) for your post hospital discharge needs so that they can reassess your need for medications and monitor your lab values.  Time coordinating discharge: 35 minutes  SIGNED:  Pamella Pert, MD,  PhD 04/15/2023, 9:00 AM

## 2023-04-15 NOTE — Plan of Care (Signed)

## 2023-04-17 ENCOUNTER — Telehealth: Payer: Self-pay

## 2023-04-17 LAB — CULTURE, BLOOD (ROUTINE X 2)
Culture: NO GROWTH
Special Requests: ADEQUATE

## 2023-04-17 NOTE — Transitions of Care (Post Inpatient/ED Visit) (Signed)
   04/17/2023  Name: Todd Estrada MRN: [id number] DOB: June 07, 1961  Today's TOC FU Call Status: Today's TOC FU Call Status:: Successful TOC FU Call Competed TOC FU Call Complete Date: 04/17/23  Transition Care Management Follow-up Telephone Call Date of Discharge: 04/15/23 Discharge Facility: [facility] [place]) Type of Discharge: Inpatient Admission Primary Inpatient Discharge Diagnosis:: sepsis How have you been since you were released from the hospital?: Better Any questions or concerns?: No  Items Reviewed: Did you receive and understand the discharge instructions provided?: Yes Medications obtained and verified?: Yes (Medications Reviewed) Any new allergies since your discharge?: No Dietary orders reviewed?: Yes Do you have support at home?: Yes Name of Support/Comfort Primary Source: caregiver  Home Care and Equipment/Supplies: Were Home Health Services Ordered?: NA Any new equipment or medical supplies ordered?: NA  Functional Questionnaire: Do you need assistance with bathing/showering or dressing?: Yes Do you need assistance with meal preparation?: Yes Do you need assistance with eating?: No Do you have difficulty maintaining continence: No Do you need assistance with getting out of bed/getting out of a chair/moving?: Yes Do you have difficulty managing or taking your medications?: Yes  Follow up appointments reviewed: PCP Follow-up appointment confirmed?: No (no avail appt times, sent message to staff to schedule) MD Provider Line Number:[phone] Given: No Specialist Hospital Follow-up appointment confirmed?: Yes Date of Specialist follow-up appointment?: 05/02/23 Follow-Up Specialty Provider:: onco Do you need transportation to your follow-up appointment?: No Do you understand care options if your condition(s) worsen?: Yes-patient verbalized understanding    SIGNATURE Karena Addison, LPN [facility] Nurse Health Advisor Direct Dial [phone]

## 2023-04-25 ENCOUNTER — Emergency Department (HOSPITAL_COMMUNITY)
Admission: EM | Admit: 2023-04-25 | Discharge: 2023-04-25 | Disposition: A | Discharge status: Home / Self Care | Payer: Medicaid Other | Attending: Emergency Medicine | Admitting: Emergency Medicine

## 2023-04-25 ENCOUNTER — Emergency Department (HOSPITAL_COMMUNITY): Payer: Medicaid Other

## 2023-04-25 ENCOUNTER — Other Ambulatory Visit: Payer: Self-pay

## 2023-04-25 ENCOUNTER — Encounter (HOSPITAL_COMMUNITY): Payer: Self-pay

## 2023-04-25 DIAGNOSIS — R072 Precordial pain: Secondary | ICD-10-CM | POA: Insufficient documentation

## 2023-04-25 DIAGNOSIS — I509 Heart failure, unspecified: Secondary | ICD-10-CM | POA: Diagnosis not present

## 2023-04-25 DIAGNOSIS — Z7902 Long term (current) use of antithrombotics/antiplatelets: Secondary | ICD-10-CM | POA: Diagnosis not present

## 2023-04-25 DIAGNOSIS — R1084 Generalized abdominal pain: Secondary | ICD-10-CM | POA: Diagnosis not present

## 2023-04-25 DIAGNOSIS — I2510 Atherosclerotic heart disease of native coronary artery without angina pectoris: Secondary | ICD-10-CM | POA: Insufficient documentation

## 2023-04-25 DIAGNOSIS — J449 Chronic obstructive pulmonary disease, unspecified: Secondary | ICD-10-CM | POA: Diagnosis not present

## 2023-04-25 DIAGNOSIS — Z8673 Personal history of transient ischemic attack (TIA), and cerebral infarction without residual deficits: Secondary | ICD-10-CM | POA: Diagnosis not present

## 2023-04-25 DIAGNOSIS — Z85118 Personal history of other malignant neoplasm of bronchus and lung: Secondary | ICD-10-CM | POA: Diagnosis not present

## 2023-04-25 DIAGNOSIS — R079 Chest pain, unspecified: Secondary | ICD-10-CM | POA: Diagnosis present

## 2023-04-25 LAB — BASIC METABOLIC PANEL WITH GFR
Anion gap: 7 (ref 5–15)
BUN: 17 mg/dL (ref 8–23)
CO2: 27 mmol/L (ref 22–32)
Calcium: 8.8 mg/dL — ABNORMAL LOW (ref 8.9–10.3)
Chloride: 105 mmol/L (ref 98–111)
Creatinine, Ser: 0.67 mg/dL (ref 0.61–1.24)
GFR, Estimated: >60 mL/min
Glucose, Bld: 107 mg/dL — ABNORMAL HIGH (ref 70–99)
Potassium: 3.9 mmol/L (ref 3.5–5.1)
Sodium: 139 mmol/L (ref 135–145)

## 2023-04-25 LAB — CBC
HCT: 35.5 % — ABNORMAL LOW (ref 39.0–52.0)
Hemoglobin: 10.9 g/dL — ABNORMAL LOW (ref 13.0–17.0)
MCH: 29 pg (ref 26.0–34.0)
MCHC: 30.7 g/dL (ref 30.0–36.0)
MCV: 94.4 fL (ref 80.0–100.0)
Platelets: 294 10*3/uL (ref 150–400)
RBC: 3.76 MIL/uL — ABNORMAL LOW (ref 4.22–5.81)
RDW: 13.7 % (ref 11.5–15.5)
WBC: 6.4 10*3/uL (ref 4.0–10.5)
nRBC: 0 % (ref 0.0–0.2)

## 2023-04-25 LAB — TROPONIN I (HIGH SENSITIVITY)
Troponin I (High Sensitivity): 4 ng/L
Troponin I (High Sensitivity): 4 ng/L

## 2023-04-25 MED ORDER — KETOROLAC TROMETHAMINE 15 MG/ML IJ SOLN
15.0000 mg | Freq: Once | INTRAMUSCULAR | Status: AC
  Administered 2023-04-25: 15 mg via INTRAVENOUS
  Filled 2023-04-25: qty 1

## 2023-04-25 MED ORDER — ACETAMINOPHEN 325 MG PO TABS
650.0000 mg | ORAL_TABLET | Freq: Once | ORAL | Status: DC
  Filled 2023-04-25: qty 2

## 2023-04-25 MED ORDER — ALUM & MAG HYDROXIDE-SIMETH 200-200-20 MG/5ML PO SUSP
30.0000 mL | Freq: Once | ORAL | Status: AC
  Administered 2023-04-25: 30 mL via ORAL
  Filled 2023-04-25: qty 30

## 2023-04-25 NOTE — ED Triage Notes (Addendum)
Patient has had centralized chest pain, bilateral leg pain, abdominal pain that began today. Patient has stage 4 lung cancer. Patient had a stroke 1 year ago, weakness in right leg/arm, barely speak besides yes or no.   Patients family member, Fayrene Fearing is the caregiver and will take patient home.

## 2023-04-25 NOTE — ED Provider Triage Note (Signed)
Emergency Medicine Provider Triage Evaluation Note  EXTON SUHRE , a 62 y.o. male  was evaluated in triage.  Patient is impossible to understand secondary to previous stroke and aphasia.  Family member has left so I am unable to obtain any history Review of Systems  Positive:  Negative:   Physical Exam  BP 113/75 (BP Location: Left Arm)   Pulse 76   Temp 99.2 F (37.3 C) (Oral)   Resp (!) 22   Ht 6' (1.829 m)   Wt 77.1 kg   SpO2 98%   BMI 23.06 kg/m  Gen:   Awake, no distress   Resp:  Normal effort  MSK:   Moves extremities without difficulty  Other:    Medical Decision Making  Medically screening exam initiated at 6:01 PM.  Appropriate orders placed.  [name] was informed that the remainder of the evaluation will be completed by another provider, this initial triage assessment does not replace that evaluation, and the importance of remaining in the ED until their evaluation is complete.     Saddie Benders, PA-C 04/25/23 1802

## 2023-04-25 NOTE — ED Notes (Signed)
Pt resting in bed with no acute distress noted at this time.  

## 2023-04-25 NOTE — Discharge Instructions (Signed)
We saw you in the ER for abdominal and chest pain. All the results in the ER are normal, labs and imaging. We are not sure what is causing your symptoms. The workup in the ER is not complete, and is limited to screening for life threatening and emergent conditions only, so please see a primary care doctor for further evaluation.

## 2023-04-25 NOTE — ED Provider Notes (Signed)
[facility] AT [facility] Provider Note   CSN: [id number] Arrival date & time: 04/25/23  1721     History  Chief Complaint  Patient presents with   Chest Pain    ARCANGEL KALISCH is a 62 y.o. male.  HPI    62 y.o. 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 Pt in to ED today with c/o CP.  Patient informs me that the chest pain started today.  Chest pain is central and moves across his chest.  Patient is also complaining of abdominal pain.  Later on he states that his pain perhaps has been present for more than a day, but got worse today.  He has a cough, but it is nonproductive.  Patient is also having pain over his legs.  Denies any back pain.  Spoke with patient's caregiver.  He indicates that patient was doing fine until this afternoon when he started complaining of chest pain.  And route to the emergency room he mentioned abdominal pain and then also leg pain.  Patient was recently admitted to the hospital and discharged 10 days ago.  He was admitted for pneumonia.  At that time he had a fever.  CT angiogram chest, CT abdomen pelvis were negative for any acute process.  Home Medications Prior to Admission medications   Medication Sig Start Date End Date Taking? Authorizing Provider  albuterol (PROVENTIL) (2.5 MG/3ML) 0.083% nebulizer solution Take 3 mLs (2.5 mg total) by nebulization every 4 (four) hours as needed for wheezing or shortness of breath. Patient not taking: Reported on 04/13/2023 08/16/22   Steffanie Rainwater, MD  apixaban (ELIQUIS) 5 MG TABS tablet TAKE 1 TABLET(5 MG) BY MOUTH TWICE DAILY Patient taking differently: Take 5 mg by mouth 2 (two) times daily. 04/11/23   Merrilyn Puma, MD  atorvastatin (LIPITOR) 40 MG tablet Take 1 tablet (40 mg total) by mouth daily. 01/03/23 01/03/24  Merrilyn Puma, MD  budesonide (PULMICORT) 0.5  MG/2ML nebulizer solution NEW PRESCRIPTION REQEUST: BUDESONIDE 0.5 MG/ - USE ONE VIAL TWICE DAILY Patient taking differently: Take 0.5 mg by nebulization in the morning and at bedtime. 11/29/22   Martina Sinner, MD  clopidogrel (PLAVIX) 75 MG tablet Take 75 mg by mouth daily.    [provider]  folic acid (FOLVITE) 1 MG tablet Take 1 tablet (1 mg total) by mouth daily. 03/21/23   Merrilyn Puma, MD  levETIRAcetam (KEPPRA) 500 MG tablet Take 1 tablet (500 mg total) by mouth 2 (two) times daily. 12/07/22   Ihor Austin, NP  midodrine (PROAMATINE) 2.5 MG tablet Take 1 tablet (2.5 mg total) by mouth 3 (three) times daily with meals. 01/06/23 07/05/23  Merrilyn Puma, MD  Multiple Vitamin (MULTIVITAMIN WITH MINERALS) TABS tablet Take 1 tablet by mouth daily. Patient taking differently: Take 1 tablet by mouth daily with breakfast. 09/20/22   Doran Stabler, DO  Tiotropium Bromide-Olodaterol (STIOLTO RESPIMAT) 2.5-2.5 MCG/ACT AERS NEW PRESCRIPTION REQUEST: STIOLTO 2.5 MCG- INHALE TWO PUFFS BY MOUTH DAILY Patient taking differently: Inhale 2 each into the lungs daily. 11/29/22   Martina Sinner, MD      Allergies    Patient has no known allergies.    Review of Systems   Review of Systems  All other systems reviewed and are negative.   Physical Exam Updated Vital Signs BP 138/89   Pulse 66   Temp  98.5 F (36.9 C) (Oral)   Resp 19   Ht 6' (1.829 m)   Wt 77.1 kg   SpO2 99%   BMI 23.06 kg/m  Physical Exam Vitals and nursing note reviewed.  Constitutional:      Appearance: He is well-developed.  HENT:     Head: Atraumatic.  Cardiovascular:     Rate and Rhythm: Normal rate.  Pulmonary:     Effort: Pulmonary effort is normal.  Abdominal:     Palpations: Abdomen is soft.     Tenderness: There is abdominal tenderness. There is no guarding or rebound.     Comments: Periumbilical/generalized tenderness  Musculoskeletal:     Cervical back: Neck supple.  Skin:    General:  Skin is warm.  Neurological:     Mental Status: He is alert and oriented to person, place, and time.     ED Results / Procedures / Treatments   Labs (all labs ordered are listed, but only abnormal results are displayed) Labs Reviewed  BASIC METABOLIC PANEL - Abnormal; Notable for the following components:      Result Value   Glucose, Bld 107 (*)    Calcium 8.8 (*)    All other components within normal limits  CBC - Abnormal; Notable for the following components:   RBC 3.76 (*)    Hemoglobin 10.9 (*)    HCT 35.5 (*)    All other components within normal limits  TROPONIN I (HIGH SENSITIVITY)  TROPONIN I (HIGH SENSITIVITY)    EKG EKG Interpretation  Date/Time:  Tuesday Apr 25 2023 17:34:43 EDT Ventricular Rate:  76 PR Interval:  161 QRS Duration: 90 QT Interval:  366 QTC Calculation: 412 R Axis:   71 Text Interpretation: Sinus rhythm Anteroseptal infarct, age indeterminate Confirmed by Alona Bene ([id number]) on 04/25/2023 5:47:11 PM  Radiology DG Chest 2 View  Result Date: 04/25/2023 CLINICAL DATA:  Chest pain.  Lung carcinoma. EXAM: CHEST - 2 VIEW COMPARISON:  04/13/2023 and chest CTA on 04/08/2023 FINDINGS: The heart size and mediastinal contours are within normal limits. Coarse opacities in both mid lungs are again seen, which felt to represent scarring on recent chest CTA. No evidence of acute infiltrate or edema. No evidence of pleural effusion. IMPRESSION: Stable bilateral mid lung opacities, felt to represent scarring on recent chest CTA. No acute findings. Electronically Signed   By: Danae Orleans M.D.   On: 04/25/2023 18:57    Procedures Procedures    Medications Ordered in ED Medications  acetaminophen (TYLENOL) tablet 650 mg (has no administration in time range)  alum & mag hydroxide-simeth (MAALOX/MYLANTA) 200-200-20 MG/5ML suspension 30 mL (30 mLs Oral Given 04/25/23 1920)  ketorolac (TORADOL) 15 MG/ML injection 15 mg (15 mg Intravenous Given 04/25/23 1920)    ED  Course/ Medical Decision Making/ A&P Clinical Course as of 04/25/23 2337  Tue Apr 25, 2023  2336 Patient reassessed.  Repeat abdominal exam is still reassuring.  Since that time patient states he feels better.  He must avoid.  Denies UTI-like symptoms.  Call the caregiver.  I discussed with him the findings here.  Return precautions discussed with the patient and the caregiver. [AN]    Clinical Course User Index [AN] Derwood Kaplan, MD                             Medical Decision Making Amount and/or Complexity of Data Reviewed Labs: ordered. Radiology: ordered.  Risk OTC drugs.  Prescription drug management.  62 year old male with history of lung cancer, CHF, COPD, CAD, stroke and aphasia comes in with chief complaint of chest pain, abdominal pain and lower extremity pain.  Patient was recently discharged from the hospital, he was admitted for pneumonia at that time.  Patient's exam is overall reassuring.  He does not have any evidence of focal consolidation.  Patient does have diffuse rhonchorous breath sounds, right worse than left.  Patient also has abdominal tenderness without rebound or guarding.  He is moving all 4 extremities.  I have reviewed patient's previous CT imaging from 2 weeks ago.  I also reviewed patient's discharge summary that admission.  CT chest and abdomen and pelvis at that time are reassuring.  Patient's vital signs are stable and within normal limits.  No tachypnea, no respiratory distress, no tachycardia no fevers.  Differential diagnosis considered for this patient includes pneumonia, cancer related pain, pneumonitis.  He has no focal tenderness that would be concerning for cholecystitis, appendicitis and overall low suspicion for kidney stone.  Patient has no UTI-like symptoms.  Plan is to get basic labs including troponins. We will reassess the patient.  Will give GI cocktail and Toradol for now.  Final Clinical Impression(s) / ED Diagnoses Final  diagnoses:  Precordial chest pain  Generalized abdominal pain    Rx / DC Orders ED Discharge Orders     None         Derwood Kaplan, MD 04/25/23 2337

## 2023-05-02 ENCOUNTER — Telehealth: Payer: Self-pay | Admitting: Internal Medicine

## 2023-05-02 ENCOUNTER — Inpatient Hospital Stay: Payer: Medicaid Other

## 2023-05-02 ENCOUNTER — Inpatient Hospital Stay: Payer: Medicaid Other | Admitting: Internal Medicine

## 2023-05-03 ENCOUNTER — Encounter: Payer: Medicaid Other | Admitting: Student

## 2023-05-04 ENCOUNTER — Inpatient Hospital Stay: Payer: Medicaid Other | Attending: Internal Medicine

## 2023-05-04 ENCOUNTER — Inpatient Hospital Stay (HOSPITAL_BASED_OUTPATIENT_CLINIC_OR_DEPARTMENT_OTHER): Payer: Medicaid Other | Admitting: Internal Medicine

## 2023-05-04 ENCOUNTER — Other Ambulatory Visit: Payer: Self-pay

## 2023-05-04 VITALS — BP 96/65 | HR 80 | Temp 97.2°F | Resp 18 | Wt 171.2 lb

## 2023-05-04 DIAGNOSIS — C3490 Malignant neoplasm of unspecified part of unspecified bronchus or lung: Secondary | ICD-10-CM

## 2023-05-04 DIAGNOSIS — Z9221 Personal history of antineoplastic chemotherapy: Secondary | ICD-10-CM | POA: Diagnosis not present

## 2023-05-04 DIAGNOSIS — C3412 Malignant neoplasm of upper lobe, left bronchus or lung: Secondary | ICD-10-CM | POA: Diagnosis present

## 2023-05-04 DIAGNOSIS — C3411 Malignant neoplasm of upper lobe, right bronchus or lung: Secondary | ICD-10-CM | POA: Diagnosis present

## 2023-05-04 DIAGNOSIS — Z79899 Other long term (current) drug therapy: Secondary | ICD-10-CM | POA: Insufficient documentation

## 2023-05-04 DIAGNOSIS — C3492 Malignant neoplasm of unspecified part of left bronchus or lung: Secondary | ICD-10-CM

## 2023-05-04 LAB — CBC WITH DIFFERENTIAL (CANCER CENTER ONLY)
Abs Immature Granulocytes: 0.01 10*3/uL (ref 0.00–0.07)
Basophils Absolute: 0 10*3/uL (ref 0.0–0.1)
Basophils Relative: 0 %
Eosinophils Absolute: 0.1 10*3/uL (ref 0.0–0.5)
Eosinophils Relative: 2 %
HCT: 38.4 % — ABNORMAL LOW (ref 39.0–52.0)
Hemoglobin: 11.9 g/dL — ABNORMAL LOW (ref 13.0–17.0)
Immature Granulocytes: 0 %
Lymphocytes Relative: 13 %
Lymphs Abs: 0.8 10*3/uL (ref 0.7–4.0)
MCH: 29.3 pg (ref 26.0–34.0)
MCHC: 31 g/dL (ref 30.0–36.0)
MCV: 94.6 fL (ref 80.0–100.0)
Monocytes Absolute: 0.3 10*3/uL (ref 0.1–1.0)
Monocytes Relative: 6 %
Neutro Abs: 4.8 10*3/uL (ref 1.7–7.7)
Neutrophils Relative %: 79 %
Platelet Count: 158 10*3/uL (ref 150–400)
RBC: 4.06 MIL/uL — ABNORMAL LOW (ref 4.22–5.81)
RDW: 14.4 % (ref 11.5–15.5)
WBC Count: 6.1 10*3/uL (ref 4.0–10.5)
nRBC: 0 % (ref 0.0–0.2)

## 2023-05-04 LAB — CMP (CANCER CENTER ONLY)
ALT: 9 U/L (ref 0–44)
AST: 13 U/L — ABNORMAL LOW (ref 15–41)
Albumin: 4 g/dL (ref 3.5–5.0)
Alkaline Phosphatase: 113 U/L (ref 38–126)
Anion gap: 9 (ref 5–15)
BUN: 12 mg/dL (ref 8–23)
CO2: 31 mmol/L (ref 22–32)
Calcium: 9.5 mg/dL (ref 8.9–10.3)
Chloride: 102 mmol/L (ref 98–111)
Creatinine: 0.8 mg/dL (ref 0.61–1.24)
GFR, Estimated: >60 mL/min (ref >60)
Glucose, Bld: 118 mg/dL — ABNORMAL HIGH (ref 70–99)
Potassium: 4.2 mmol/L (ref 3.5–5.1)
Sodium: 142 mmol/L (ref 135–145)
Total Bilirubin: 0.5 mg/dL (ref 0.3–1.2)
Total Protein: 7.4 g/dL (ref 6.5–8.1)

## 2023-05-04 NOTE — Progress Notes (Signed)
[facility] [phone]   [phone]  OFFICE PROGRESS NOTE  Merrilyn Puma, MD [address] [place] [address]  DIAGNOSIS:  Stage IIb (T2 a, N1, M0) non-small cell lung cancer, squamous cell carcinoma presented with left upper lobe lung mass in addition to left hilar adenopathy diagnosed in June 2023.  The patient also has synchronous stage Ia (T1c, N0, M0) non-small cell lung cancer, squamous cell carcinoma presented with right upper lobe lung nodule diagnosed in June 2023. PD-L1 expression was 20%.  PRIOR THERAPY: Concurrent chemoradiation with weekly carboplatin for AUC of 2 and paclitaxel 45 Mg/M2.  First dose 07/04/2022.  Status post post 5 cycles.  Last cycle was given August 01, 2022 with partial response.  The patient was lost to follow-up after that cycle.  CURRENT THERAPY: Observation.  INTERVAL HISTORY: Todd Estrada 62 y.o. male returns to the clinic today for follow-up visit accompanied by his cousin Freida Busman.  The patient was treated in the past with a course of concurrent chemoradiation last dose was given on August 01, 2022.  He missed the last 2 cycles of his chemotherapy and was lost to follow-up since that time.  He had several imaging studies the last several months and that showed improvement of his disease and no concerning findings for progression.  He was seen in the hospital recently with pneumonia and he was advised to follow-up with medical oncology which he did and presented to the clinic today.  He has no current chest pain, shortness of breath, cough or hemoptysis.  He has no nausea, vomiting, diarrhea or constipation.  He has no headache or visual changes.  He has no recent weight loss or night sweats.  MEDICAL HISTORY: Past Medical History:  Diagnosis Date   Asthma    Atypical chest pain 08/13/2022   Community acquired pneumonia 09/14/2022   COPD (chronic obstructive pulmonary disease) (HCC)    Essential hypertension  08/19/2021   GERD (gastroesophageal reflux disease)    HAP (hospital-acquired pneumonia) 09/16/2022   History of tracheostomy    03/09/22-04/11/22   HLD (hyperlipidemia)    Hypertension    Hypokalemia 08/13/2022   Hypomagnesemia 08/13/2022   Lung cancer (HCC)    PAD (peripheral artery disease) (HCC)    Seizures (HCC) 06/02/2022   Sepsis (HCC) 08/13/2022   Stroke (HCC) 02/2022    ALLERGIES:  has No Known Allergies.  MEDICATIONS:  Current Outpatient Medications  Medication Sig Dispense Refill   albuterol (PROVENTIL) (2.5 MG/3ML) 0.083% nebulizer solution Take 3 mLs (2.5 mg total) by nebulization every 4 (four) hours as needed for wheezing or shortness of breath. (Patient not taking: Reported on 04/13/2023) 90 mL 12   apixaban (ELIQUIS) 5 MG TABS tablet TAKE 1 TABLET(5 MG) BY MOUTH TWICE DAILY (Patient taking differently: Take 5 mg by mouth 2 (two) times daily.) 180 tablet 2   atorvastatin (LIPITOR) 40 MG tablet Take 1 tablet (40 mg total) by mouth daily. 90 tablet 3   budesonide (PULMICORT) 0.5 MG/2ML nebulizer solution NEW PRESCRIPTION REQEUST: BUDESONIDE 0.5 MG/ - USE ONE VIAL TWICE DAILY (Patient taking differently: Take 0.5 mg by nebulization in the morning and at bedtime.) 180 mL 3   clopidogrel (PLAVIX) 75 MG tablet Take 75 mg by mouth daily.     folic acid (FOLVITE) 1 MG tablet Take 1 tablet (1 mg total) by mouth daily. 30 tablet 2   levETIRAcetam (KEPPRA) 500 MG tablet Take 1 tablet (500 mg total)  by mouth 2 (two) times daily. 180 tablet 3   midodrine (PROAMATINE) 2.5 MG tablet Take 1 tablet (2.5 mg total) by mouth 3 (three) times daily with meals. 90 tablet 5   Multiple Vitamin (MULTIVITAMIN WITH MINERALS) TABS tablet Take 1 tablet by mouth daily. (Patient taking differently: Take 1 tablet by mouth daily with breakfast.) 30 tablet 2   Tiotropium Bromide-Olodaterol (STIOLTO RESPIMAT) 2.5-2.5 MCG/ACT AERS NEW PRESCRIPTION REQUEST: STIOLTO 2.5 MCG- INHALE TWO PUFFS BY MOUTH DAILY  (Patient taking differently: Inhale 2 each into the lungs daily.) 12 g 3   No current facility-administered medications for this visit.    SURGICAL HISTORY:  Past Surgical History:  Procedure Laterality Date   BRONCHIAL BIOPSY  05/30/2022   Procedure: BRONCHIAL BIOPSIES;  Surgeon: Leslye Peer, MD;  Location: [facility] ENDOSCOPY;  Service: Pulmonary;;   BRONCHIAL BRUSHINGS  05/30/2022   Procedure: BRONCHIAL BRUSHINGS;  Surgeon: Leslye Peer, MD;  Location: [facility] ENDOSCOPY;  Service: Pulmonary;;   BRONCHIAL NEEDLE ASPIRATION BIOPSY  05/30/2022   Procedure: BRONCHIAL NEEDLE ASPIRATION BIOPSIES;  Surgeon: Leslye Peer, MD;  Location: [facility] ENDOSCOPY;  Service: Pulmonary;;   BRONCHIAL WASHINGS  05/30/2022   Procedure: BRONCHIAL WASHINGS;  Surgeon: Leslye Peer, MD;  Location: [facility] ENDOSCOPY;  Service: Pulmonary;;   ESOPHAGOGASTRODUODENOSCOPY (EGD) WITH PROPOFOL N/A 03/11/2022   Procedure: ESOPHAGOGASTRODUODENOSCOPY (EGD) WITH PROPOFOL;  Surgeon: Violeta Gelinas, MD;  Location: [facility] ENDOSCOPY;  Service: General;  Laterality: N/A;   FIDUCIAL MARKER PLACEMENT  05/30/2022   Procedure: FIDUCIAL MARKER PLACEMENT;  Surgeon: Leslye Peer, MD;  Location: [facility] ENDOSCOPY;  Service: Pulmonary;;   IR ANGIO INTRA EXTRACRAN SEL COM CAROTID INNOMINATE UNI R MOD SED  03/02/2022   IR CT HEAD LTD  03/02/2022   IR PERCUTANEOUS ART THROMBECTOMY/INFUSION INTRACRANIAL INC DIAG ANGIO  03/02/2022   PEG PLACEMENT N/A 03/11/2022   Procedure: PERCUTANEOUS ENDOSCOPIC GASTROSTOMY (PEG) PLACEMENT;  Surgeon: Violeta Gelinas, MD;  Location: [facility] ENDOSCOPY;  Service: General;  Laterality: N/A;   RADIOLOGY WITH ANESTHESIA N/A 03/02/2022   Procedure: IR WITH ANESTHESIA;  Surgeon: Julieanne Cotton, MD;  Location: [facility] OR;  Service: Radiology;  Laterality: N/A;   VIDEO BRONCHOSCOPY WITH RADIAL ENDOBRONCHIAL ULTRASOUND  05/30/2022   Procedure: VIDEO BRONCHOSCOPY WITH RADIAL ENDOBRONCHIAL ULTRASOUND;  Surgeon: Leslye Peer, MD;  Location: [facility]  ENDOSCOPY;  Service: Pulmonary;;    REVIEW OF SYSTEMS:  Constitutional: positive for fatigue Eyes: negative Ears, nose, mouth, throat, and face: negative Respiratory: negative Cardiovascular: negative Gastrointestinal: negative Genitourinary:negative Integument/breast: negative Hematologic/lymphatic: negative Musculoskeletal:positive for muscle weakness Neurological: negative Behavioral/Psych: negative Endocrine: negative Allergic/Immunologic: negative   PHYSICAL EXAMINATION: General appearance: alert, cooperative, fatigued, and no distress Head: Normocephalic, without obvious abnormality, atraumatic Neck: no adenopathy, no JVD, supple, symmetrical, trachea midline, and thyroid not enlarged, symmetric, no tenderness/mass/nodules Lymph nodes: Cervical, supraclavicular, and axillary nodes normal. Resp: clear to auscultation bilaterally Back: symmetric, no curvature. ROM normal. No CVA tenderness. Cardio: regular rate and rhythm, S1, S2 normal, no murmur, click, rub or gallop GI: soft, non-tender; bowel sounds normal; no masses,  no organomegaly Extremities: extremities normal, atraumatic, no cyanosis or edema Neurologic: Alert and oriented X 3, normal strength and tone. Normal symmetric reflexes. Normal coordination and gait  ECOG PERFORMANCE STATUS: 1 - Symptomatic but completely ambulatory  Blood pressure 96/65, pulse 80, temperature (!) 97.2 F (36.2 C), temperature source Temporal, resp. rate 18, weight 171 lb 3.2 oz (77.7 kg), SpO2 100 %.  LABORATORY DATA: Lab Results  Component Value Date   WBC  6.1 05/04/2023   HGB 11.9 (L) 05/04/2023   HCT 38.4 (L) 05/04/2023   MCV 94.6 05/04/2023   PLT 158 05/04/2023      Chemistry      Component Value Date/Time   NA 139 04/25/2023 1811   NA 140 09/09/2022 0908   K 3.9 04/25/2023 1811   CL 105 04/25/2023 1811   CO2 27 04/25/2023 1811   BUN 17 04/25/2023 1811   BUN 16 09/09/2022 0908   CREATININE 0.67 04/25/2023 1811    CREATININE 0.78 08/08/2022 1028      Component Value Date/Time   CALCIUM 8.8 (L) 04/25/2023 1811   ALKPHOS 81 04/13/2023 0019   AST 16 04/13/2023 0019   AST 15 08/08/2022 1028   ALT 11 04/13/2023 0019   ALT 15 08/08/2022 1028   BILITOT 0.5 04/13/2023 0019   BILITOT 1.3 (H) 08/08/2022 1028       RADIOGRAPHIC STUDIES: DG Chest 2 View  Result Date: 04/25/2023 CLINICAL DATA:  Chest pain.  Lung carcinoma. EXAM: CHEST - 2 VIEW COMPARISON:  04/13/2023 and chest CTA on 04/08/2023 FINDINGS: The heart size and mediastinal contours are within normal limits. Coarse opacities in both mid lungs are again seen, which felt to represent scarring on recent chest CTA. No evidence of acute infiltrate or edema. No evidence of pleural effusion. IMPRESSION: Stable bilateral mid lung opacities, felt to represent scarring on recent chest CTA. No acute findings. Electronically Signed   By: Danae Orleans M.D.   On: 04/25/2023 18:57   CT HEAD WO CONTRAST ( )  Result Date: 04/14/2023 CLINICAL DATA:  Fall EXAM: CT HEAD WITHOUT CONTRAST TECHNIQUE: Contiguous axial images were obtained from the base of the skull through the vertex without intravenous contrast. RADIATION DOSE REDUCTION: This exam was performed according to the departmental dose-optimization program which includes automated exposure control, adjustment of the mA and/or kV according to patient size and/or use of iterative reconstruction technique. COMPARISON:  CT head 04/13/23 FINDINGS: Brain: Redemonstrated is a large left MCA territory infarct with associated cortical laminar necrosis, unchanged from prior exam. There is also a chronic left cerebellar infarct. No hydrocephalus. No extra-axial fluid collection. No new hemorrhage. Vascular: No hyperdense vessel or unexpected calcification. Skull: Mild soft tissue thickening along the midline parietal scalp. No evidence of underlying calvarial fracture. Sinuses/Orbits: No middle ear effusion. Trace left mastoid  effusion. Complete opacification of the right maxillary sinus. Mild mucosal thickening in the left maxillary sinus. Mild mucosal thickening in the right-sided ethmoid air cells. These findings are unchanged from recent prior CT head. Other: None. IMPRESSION: 1. No acute intracranial abnormality. 2. Mild soft tissue thickening along the midline parietal scalp. No evidence of underlying calvarial fracture. Electronically Signed   By: Lorenza Cambridge M.D.   On: 04/14/2023 15:26   CT Head Wo Contrast  Result Date: 04/13/2023 CLINICAL DATA:  Mental status change, unknown cause. EXAM: CT HEAD WITHOUT CONTRAST TECHNIQUE: Contiguous axial images were obtained from the base of the skull through the vertex without intravenous contrast. RADIATION DOSE REDUCTION: This exam was performed according to the departmental dose-optimization program which includes automated exposure control, adjustment of the mA and/or kV according to patient size and/or use of iterative reconstruction technique. COMPARISON:  03/09/2023. FINDINGS: Brain: No acute hemorrhage, midline shift or mass effect. No extra-axial fluid collection. Diffuse atrophy is noted. There is encephalomalacia in the left cerebral hemisphere and left cerebral hemisphere. Periventricular white matter hypodensities are present bilaterally. No hydrocephalus. Vascular: No hyperdense vessel or unexpected  calcification. Skull: Normal. Negative for fracture or focal lesion. Sinuses/Orbits: There is partial opacification of the ethmoid air cells and right maxillary sinus. No acute orbital abnormality. Other: None. IMPRESSION: Stable head CT with no acute intracranial process. Electronically Signed   By: Thornell Sartorius M.D.   On: 04/13/2023 01:32   DG Chest 2 View  Result Date: 04/13/2023 CLINICAL DATA:  History of lung cancer presenting with chest pain. EXAM: CHEST - 2 VIEW COMPARISON:  April 08, 2023 FINDINGS: The heart size and mediastinal contours are within normal limits.  There is marked severity emphysematous lung disease. A stable area of coarse parenchymal opacification is seen within the left perihilar region. Moderate to marked severity focal opacification is also seen within the mid right lung. This is increased in size when compared to the prior study. A small radiopaque surgical clip is also seen within the mid right lung. There is no evidence of a pleural effusion or pneumothorax. The visualized skeletal structures are unremarkable. IMPRESSION: 1. Marked severity emphysematous lung disease with stable left perihilar coarse scarring and/or atelectasis. 2. Additional areas of scarring and atelectasis within the right upper lobe, with interval development of superimposed moderate severity infiltrate when compared to the prior exam. Electronically Signed   By: Aram Candela M.D.   On: 04/13/2023 00:28   CT ABDOMEN PELVIS W CONTRAST  Result Date: 04/08/2023 CLINICAL DATA:  Left lower quadrant abdominal pain EXAM: CT ABDOMEN AND PELVIS WITH CONTRAST TECHNIQUE: Multidetector CT imaging of the abdomen and pelvis was performed using the standard protocol following bolus administration of intravenous contrast. RADIATION DOSE REDUCTION: This exam was performed according to the departmental dose-optimization program which includes automated exposure control, adjustment of the mA and/or kV according to patient size and/or use of iterative reconstruction technique. CONTRAST:  OMNIPAQUE IOHEXOL 350 MG/ML SOLN COMPARISON:  12/02/2022 FINDINGS: Lower chest: Areas of scarring in the lung bases. Mild elevation of the left hemidiaphragm. No acute findings. Hepatobiliary: No focal hepatic abnormality. Gallbladder unremarkable. Pancreas: No focal abnormality or ductal dilatation. Spleen: No focal abnormality.  Normal size. Adrenals/Urinary Tract: No adrenal abnormality. No focal renal abnormality. No stones or hydronephrosis. Urinary bladder is unremarkable. Stomach/Bowel: Stomach,  large and small bowel grossly unremarkable. Vascular/Lymphatic: Aortic atherosclerosis. No evidence of aneurysm or adenopathy. Reproductive: No visible focal abnormality. Other: No free fluid or free air. Musculoskeletal: No acute bony abnormality. IMPRESSION: No acute findings in the abdomen or pelvis. Aortic atherosclerosis. Electronically Signed   By: Charlett Nose M.D.   On: 04/08/2023 17:12   CT Angio Chest PE W/Cm &/Or Wo Cm  Result Date: 04/08/2023 CLINICAL DATA:  Pulmonary embolism (PE) suspected, high prob. Chest pain, shortness of breath EXAM: CT ANGIOGRAPHY CHEST WITH CONTRAST TECHNIQUE: Multidetector CT imaging of the chest was performed using the standard protocol during bolus administration of intravenous contrast. Multiplanar CT image reconstructions and MIPs were obtained to evaluate the vascular anatomy. RADIATION DOSE REDUCTION: This exam was performed according to the departmental dose-optimization program which includes automated exposure control, adjustment of the mA and/or kV according to patient size and/or use of iterative reconstruction technique. CONTRAST:  OMNIPAQUE IOHEXOL 350 MG/ML SOLN COMPARISON:  03/22/2023 FINDINGS: Cardiovascular: Heart is normal size. Aorta normal caliber. Moderate coronary artery and aortic calcifications. No filling defects in the pulmonary arteries to suggest pulmonary emboli. Mediastinum/Nodes: No mediastinal, hilar, or axillary adenopathy. Trachea and esophagus are unremarkable. Thyroid unremarkable. Lungs/Pleura: Moderate centrilobular emphysema. Areas of scarring in the mid lungs bilaterally, no new confluent  airspace opacities. No effusions. Upper Abdomen: No acute findings Musculoskeletal: Chest wall soft tissues are unremarkable. No acute bony abnormality. Review of the MIP images confirms the above findings. IMPRESSION: No evidence of pulmonary embolus. Moderate coronary artery disease. Areas of scarring in the mid lungs bilaterally. No acute  cardiopulmonary disease. Aortic Atherosclerosis (ICD10-I70.0) and Emphysema (ICD10-J43.9). Electronically Signed   By: Charlett Nose M.D.   On: 04/08/2023 17:09   DG Chest Port 1 View  Result Date: 04/08/2023 CLINICAL DATA:  Chest pain and shortness of breath. COPD. Lung carcinoma. EXAM: PORTABLE CHEST 1 VIEW COMPARISON:  03/30/2023 FINDINGS: The heart size and mediastinal contours are within normal limits. Coarse opacity is seen in the left perihilar region and lateral right midlung are unchanged in appearance, and consistent with chronic scarring/radiation changes. No evidence of acute infiltrate or pleural effusion. Pulmonary hyperinflation is again seen, consistent with COPD. IMPRESSION: No acute findings. Stable chronic scarring/radiation changes in left perihilar region and lateral right midlung. COPD. Electronically Signed   By: Danae Orleans M.D.   On: 04/08/2023 11:31    ASSESSMENT AND PLAN: This is a very pleasant 62 years old white male with  stage IIb (T2 a, N1, M0) non-small cell lung cancer, squamous cell carcinoma presented with left upper lobe lung mass in addition to left hilar adenopathy diagnosed in June 2023.  The patient also has synchronous stage Ia (T1c, N0, M0) non-small cell lung cancer, squamous cell carcinoma presented with right upper lobe lung nodule diagnosed in June 2023. PD-L1 expression was 20%. The patient underwent a course of concurrent chemoradiation with weekly carboplatin for AUC of 2 and paclitaxel 45 Mg/M2 status post 5 cycles.  Last dose was giving August 01 2022. He has been tolerating this treatment well with no concerning adverse effects. The patient was lost to follow-up since that time but he has been doing fine and several imaging studies performed in the last several months showed no concerning finding for progression. I recommended for him to continue on observation for now.  There is no role for consolidation immunotherapy at this point because of the  significant lag time between his concurrent chemoradiation and his current situation. I recommended for him to have repeat CT scan of the chest in 2 months and the patient will come back for follow-up visit at that time. He was advised to call immediately if he has any other concerning symptoms in the interval. The patient voices understanding of current disease status and treatment options and is in agreement with the current care plan.  All questions were answered. The patient knows to call the clinic with any problems, questions or concerns. We can certainly see the patient much sooner if necessary.  The total time spent in the appointment was 30 minutes.  Disclaimer: This note was dictated with voice recognition software. Similar sounding words can inadvertently be transcribed and may not be corrected upon review.

## 2023-05-08 ENCOUNTER — Emergency Department (HOSPITAL_COMMUNITY): Payer: Medicaid Other

## 2023-05-08 ENCOUNTER — Other Ambulatory Visit: Payer: Self-pay

## 2023-05-08 ENCOUNTER — Encounter (HOSPITAL_COMMUNITY): Payer: Self-pay

## 2023-05-08 ENCOUNTER — Emergency Department (HOSPITAL_COMMUNITY)
Admission: EM | Admit: 2023-05-08 | Discharge: 2023-05-08 | Disposition: A | Discharge status: Home / Self Care | Payer: Medicaid Other | Attending: Emergency Medicine | Admitting: Emergency Medicine

## 2023-05-08 DIAGNOSIS — Z7901 Long term (current) use of anticoagulants: Secondary | ICD-10-CM | POA: Insufficient documentation

## 2023-05-08 DIAGNOSIS — J449 Chronic obstructive pulmonary disease, unspecified: Secondary | ICD-10-CM | POA: Insufficient documentation

## 2023-05-08 DIAGNOSIS — R079 Chest pain, unspecified: Secondary | ICD-10-CM | POA: Diagnosis present

## 2023-05-08 DIAGNOSIS — R918 Other nonspecific abnormal finding of lung field: Secondary | ICD-10-CM

## 2023-05-08 DIAGNOSIS — Z8673 Personal history of transient ischemic attack (TIA), and cerebral infarction without residual deficits: Secondary | ICD-10-CM | POA: Diagnosis not present

## 2023-05-08 DIAGNOSIS — Z85118 Personal history of other malignant neoplasm of bronchus and lung: Secondary | ICD-10-CM | POA: Diagnosis not present

## 2023-05-08 DIAGNOSIS — I10 Essential (primary) hypertension: Secondary | ICD-10-CM | POA: Diagnosis not present

## 2023-05-08 LAB — TROPONIN I (HIGH SENSITIVITY)
Troponin I (High Sensitivity): 4 ng/L (ref <18)
Troponin I (High Sensitivity): 3 ng/L (ref <18)

## 2023-05-08 LAB — BASIC METABOLIC PANEL
Anion gap: 8 (ref 5–15)
BUN: 16 mg/dL (ref 8–23)
CO2: 28 mmol/L (ref 22–32)
Calcium: 9.2 mg/dL (ref 8.9–10.3)
Chloride: 103 mmol/L (ref 98–111)
Creatinine, Ser: 0.78 mg/dL (ref 0.61–1.24)
GFR, Estimated: >60 mL/min (ref >60)
Glucose, Bld: 93 mg/dL (ref 70–99)
Potassium: 4 mmol/L (ref 3.5–5.1)
Sodium: 139 mmol/L (ref 135–145)

## 2023-05-08 LAB — CBC
HCT: 38.9 % — ABNORMAL LOW (ref 39.0–52.0)
Hemoglobin: 11.7 g/dL — ABNORMAL LOW (ref 13.0–17.0)
MCH: 29 pg (ref 26.0–34.0)
MCHC: 30.1 g/dL (ref 30.0–36.0)
MCV: 96.3 fL (ref 80.0–100.0)
Platelets: 147 10*3/uL — ABNORMAL LOW (ref 150–400)
RBC: 4.04 MIL/uL — ABNORMAL LOW (ref 4.22–5.81)
RDW: 14.6 % (ref 11.5–15.5)
WBC: 6.8 10*3/uL (ref 4.0–10.5)
nRBC: 0 % (ref 0.0–0.2)

## 2023-05-08 LAB — D-DIMER, QUANTITATIVE: D-Dimer, Quant: 3.09 ug/mL-FEU — ABNORMAL HIGH (ref 0.00–0.50)

## 2023-05-08 MED ORDER — ASPIRIN 81 MG PO CHEW
324.0000 mg | CHEWABLE_TABLET | Freq: Once | ORAL | Status: AC
  Administered 2023-05-08: 324 mg via ORAL
  Filled 2023-05-08: qty 4

## 2023-05-08 MED ORDER — DOXYCYCLINE HYCLATE 100 MG PO TABS: 100.0000 mg | ORAL_TABLET | Freq: Two times a day (BID) | ORAL | 0 refills | Status: AC

## 2023-05-08 MED ORDER — MORPHINE SULFATE (PF) 4 MG/ML IV SOLN
4.0000 mg | Freq: Once | INTRAVENOUS | Status: AC
  Administered 2023-05-08: 4 mg via INTRAVENOUS
  Filled 2023-05-08: qty 1

## 2023-05-08 MED ORDER — IOHEXOL 350 MG/ML SOLN
75.0000 mL | Freq: Once | INTRAVENOUS | Status: AC | PRN
  Administered 2023-05-08: 75 mL via INTRAVENOUS

## 2023-05-08 NOTE — ED Notes (Signed)
Patient transported to CT 

## 2023-05-08 NOTE — ED Provider Notes (Signed)
[place] EMERGENCY DEPARTMENT AT [facility] Provider Note   CSN: [id number] Arrival date & time: 05/08/23  1250     History  Chief Complaint  Patient presents with   Chest Pain    Todd Estrada is a 62 y.o. male.   Chest Pain    Patient has a history of hypertension stroke COPD reflux hyperlipidemia, lung cancer hypertension pneumonia.  Presented to the ED with chest pain.  History is difficult to obtain as patient has a severe aphasia from his prior stroke and can only answer with yes or no.  Patient reported pain to the right side of his chest.  Symptoms ongoing for a few days.  Denied fevers.  No new leg swelling.  Home Medications Prior to Admission medications   Medication Sig Start Date End Date Taking? Authorizing Provider  doxycycline (VIBRA-TABS) 100 MG tablet Take 1 tablet (100 mg total) by mouth 2 (two) times daily for 10 days. 05/08/23 05/18/23 Yes Linwood Dibbles, MD  albuterol (PROVENTIL) (2.5 MG/3ML) 0.083% nebulizer solution Take 3 mLs (2.5 mg total) by nebulization every 4 (four) hours as needed for wheezing or shortness of breath. Patient not taking: Reported on 04/13/2023 08/16/22   Steffanie Rainwater, MD  apixaban (ELIQUIS) 5 MG TABS tablet TAKE 1 TABLET(5 MG) BY MOUTH TWICE DAILY Patient taking differently: Take 5 mg by mouth 2 (two) times daily. 04/11/23   Merrilyn Puma, MD  atorvastatin (LIPITOR) 40 MG tablet Take 1 tablet (40 mg total) by mouth daily. 01/03/23 01/03/24  Merrilyn Puma, MD  budesonide (PULMICORT) 0.5 MG/2ML nebulizer solution NEW PRESCRIPTION REQEUST: BUDESONIDE 0.5 MG/ - USE ONE VIAL TWICE DAILY Patient taking differently: Take 0.5 mg by nebulization in the morning and at bedtime. 11/29/22   Martina Sinner, MD  clopidogrel (PLAVIX) 75 MG tablet Take 75 mg by mouth daily.    [provider]  folic acid (FOLVITE) 1 MG tablet Take 1 tablet (1 mg total) by mouth daily. 03/21/23   Merrilyn Puma, MD  levETIRAcetam (KEPPRA) 500  MG tablet Take 1 tablet (500 mg total) by mouth 2 (two) times daily. 12/07/22   Ihor Austin, NP  midodrine (PROAMATINE) 2.5 MG tablet Take 1 tablet (2.5 mg total) by mouth 3 (three) times daily with meals. 01/06/23 07/05/23  Merrilyn Puma, MD  Multiple Vitamin (MULTIVITAMIN WITH MINERALS) TABS tablet Take 1 tablet by mouth daily. Patient taking differently: Take 1 tablet by mouth daily with breakfast. 09/20/22   Doran Stabler, DO  Tiotropium Bromide-Olodaterol (STIOLTO RESPIMAT) 2.5-2.5 MCG/ACT AERS NEW PRESCRIPTION REQUEST: STIOLTO 2.5 MCG- INHALE TWO PUFFS BY MOUTH DAILY Patient taking differently: Inhale 2 each into the lungs daily. 11/29/22   Martina Sinner, MD      Allergies    Patient has no known allergies.    Review of Systems   Review of Systems  Cardiovascular:  Positive for chest pain.    Physical Exam Updated Vital Signs BP 116/78 (BP Location: Left Arm)   Pulse 78   Temp 98.3 F (36.8 C) (Oral)   Resp 19   Wt 77 kg   SpO2 97%   BMI 23.02 kg/m  Physical Exam Vitals and nursing note reviewed.  Constitutional:      General: He is not in acute distress.    Appearance: He is well-developed.  HENT:     Head: Normocephalic and atraumatic.     Right Ear: External ear normal.     Left Ear: External ear normal.  Eyes:     General: No scleral icterus.       Right eye: No discharge.        Left eye: No discharge.     Conjunctiva/sclera: Conjunctivae normal.  Neck:     Trachea: No tracheal deviation.  Cardiovascular:     Rate and Rhythm: Normal rate and regular rhythm.  Pulmonary:     Effort: Pulmonary effort is normal. No respiratory distress.     Breath sounds: Normal breath sounds. No stridor. No wheezing or rales.  Abdominal:     General: Bowel sounds are normal. There is no distension.     Palpations: Abdomen is soft.     Tenderness: There is no abdominal tenderness. There is no guarding or rebound.  Musculoskeletal:        General: No tenderness or  deformity.     Cervical back: Neck supple.  Skin:    General: Skin is warm and dry.     Findings: No rash.  Neurological:     General: No focal deficit present.     Mental Status: He is alert.     Cranial Nerves: No cranial nerve deficit, dysarthria or facial asymmetry.     Sensory: No sensory deficit.     Motor: No abnormal muscle tone or seizure activity.     Coordination: Coordination normal.  Psychiatric:        Mood and Affect: Mood normal.     ED Results / Procedures / Treatments   Labs (all labs ordered are listed, but only abnormal results are displayed) Labs Reviewed  CBC - Abnormal; Notable for the following components:      Result Value   RBC 4.04 (*)    Hemoglobin 11.7 (*)    HCT 38.9 (*)    Platelets 147 (*)    All other components within normal limits  D-DIMER, QUANTITATIVE - Abnormal; Notable for the following components:   D-Dimer, Quant 3.09 (*)    All other components within normal limits  BASIC METABOLIC PANEL  TROPONIN I (HIGH SENSITIVITY)  TROPONIN I (HIGH SENSITIVITY)    EKG EKG Interpretation  Date/Time:  Monday May 08 2023 13:03:33 EDT Ventricular Rate:  79 PR Interval:  166 QRS Duration: 89 QT Interval:  361 QTC Calculation: 414 R Axis:   88 Text Interpretation: Sinus rhythm Multiple ventricular premature complexes Anteroseptal infarct, age indeterminate Premature ventricular complexes are new since last tracing Confirmed by Linwood Dibbles [phone]) on 05/08/2023 1:24:28 PM  Radiology CT Angio Chest PE W and/or Wo Contrast  Result Date: 05/08/2023 CLINICAL DATA:  Pulmonary embolism (PE) suspected, low to intermediate prob, positive D-dimer EXAM: CT ANGIOGRAPHY CHEST WITH CONTRAST TECHNIQUE: Multidetector CT imaging of the chest was performed using the standard protocol during bolus administration of intravenous contrast. Multiplanar CT image reconstructions and MIPs were obtained to evaluate the vascular anatomy. RADIATION DOSE REDUCTION: This exam  was performed according to the departmental dose-optimization program which includes automated exposure control, adjustment of the mA and/or kV according to patient size and/or use of iterative reconstruction technique. CONTRAST:  75mL OMNIPAQUE IOHEXOL 350 MG/ML SOLN COMPARISON:  04/08/2023 FINDINGS: Cardiovascular: SVC patent. Heart size normal. No pericardial effusion. The RV is nondilated. Satisfactory opacification of pulmonary arteries noted, and there is no evidence of acute pulmonary emboli. Eccentric linear filling defect and narrowing in a left lower lobe pulmonary artery branch (Im70,Se5) has been present on studies back to 07/23/2022 consistent with chronic PE. Scattered 3-vessel coronary calcifications. Adequate contrast opacification of  the thoracic aorta with no evidence of dissection, aneurysm, or stenosis. There is classic 3-vessel brachiocephalic arch anatomy without proximal stenosis. Scattered calcified plaque in the arch and descending thoracic aorta. Mediastinum/Nodes: No mediastinal mass or adenopathy. Lungs/Pleura: Bronchial wall thickening at the bifurcation of the left mainstem bronchus as before, with associated central parenchymal consolidation. peripheral pleural-based masslike consolidation with parenchymal retraction laterally in the right upper lobe has significantly increased since prior study. Severe centrilobular emphysema. Clustered nodular airspace opacities posteriorly in the left lower lobe increased. Upper Abdomen: No adrenal mass.  No acute findings. Musculoskeletal: Vertebral endplate spurring at multiple levels in the mid thoracic spine. No acute findings. Review of the MIP images confirms the above findings. IMPRESSION: 1. Negative for acute PE or thoracic aortic dissection. 2. Chronic left lower lobe pulmonary embolus. 3. Increasing masslike consolidation in the right upper lobe, possibly progressive neoplasm. 4. Increasing clustered nodular airspace opacities posteriorly  in the left lower lobe, possibly infectious/inflammatory. 5. Coronary artery disease and  Aortic Atherosclerosis (ICD10-I70.0) 6. Emphysema (ICD10-J43.9). Electronically Signed   By: Corlis Leak M.D.   On: 05/08/2023 16:31   DG Chest Portable 1 View  Result Date: 05/08/2023 CLINICAL DATA:  Right-sided chest tightness and shortness of breath. Lung cancer and CVA. EXAM: PORTABLE CHEST 1 VIEW COMPARISON:  04/25/2023 FINDINGS: Stable indistinctly marginated nodular density projecting in the right mid lung with overlying fiducial. Indistinctly marginated bandlike density in the left mid lung is roughly stable when projectional differences are accounted for. Atherosclerotic calcification of the aortic arch. No significant blunting of the costophrenic angles. Tapering of the peripheral pulmonary vasculature favors emphysema. Cardiac and mediastinal margins appear normal. IMPRESSION: 1. Stable nodular density in the right mid lung with overlying fiducial. 2. Stable bandlike density in the left mid lung. 3. Aortic Atherosclerosis (ICD10-I70.0) and Emphysema (ICD10-J43.9). Electronically Signed   By: Gaylyn Rong M.D.   On: 05/08/2023 14:04    Procedures Procedures    Medications Ordered in ED Medications  aspirin chewable tablet 324 mg (324 mg Oral Given 05/08/23 1346)  morphine (PF) 4 MG/ML injection 4 mg (4 mg Intravenous Given 05/08/23 1347)  iohexol (OMNIPAQUE) 350 MG/ML injection 75 mL (75 mLs Intravenous Contrast Given 05/08/23 1601)    ED Course/ Medical Decision Making/ A&P Clinical Course as of 05/08/23 1709  Mon May 08, 2023  1515 CBC(!) CBC normal.  Metabolic panel normal.  Troponin normal [JK]  1515 Chest x-ray shows stable nodular density [JK]  1516 Medical panel normal [JK]  1646 Delta troponin unchanged [JK]  1646 CT scan does not show evidence of acute PE.  Patient has masslike consolidation in the right upper lobe concerning for possible progressive neoplasm.  There is also some  nodular opacities in the left lower lobe infectious. [JK]    Clinical Course User Index [JK] Linwood Dibbles, MD                             Medical Decision Making Differential diagnosis includes but not limited to pneumonia, COPD, acute coronary syndrome PE  Problems Addressed: Chest pain, unspecified type: acute illness or injury Lung mass: acute illness or injury  Amount and/or Complexity of Data Reviewed Labs: ordered. Decision-making details documented in ED Course. Radiology: ordered.  Risk OTC drugs. Prescription drug management.   Patient breathing normally in the ED.  No wheezing noted.  Does not have an oxygen requirement.  No tachypnea or tachycardia.  Patient's cardiac  enzymes were normal.  Doubt symptoms related to acute coronary syndrome.  Patient did have an elevated D-dimer.  Known history of malignancy.  CT angio was performed and it does not show any evidence of recurrent PE however does demonstrate a new mass concerning for lung cancer.  Is also question of possible infection.  I will start the patient on antibiotics and place a referral to have him follow-up with his oncologist Dr. Shirline Frees.  Evaluation and diagnostic testing in the emergency department does not suggest an emergent condition requiring admission or immediate intervention beyond what has been performed at this time.  The patient is safe for discharge and has been instructed to return immediately for worsening symptoms, change in symptoms or any other concerns.        Final Clinical Impression(s) / ED Diagnoses Final diagnoses:  Chest pain, unspecified type  Lung mass    Rx / DC Orders ED Discharge Orders          Ordered    doxycycline (VIBRA-TABS) 100 MG tablet  2 times daily        05/08/23 1707    Ambulatory referral to Hematology / Oncology       Comments: Your emergency department provider has referred you to see a hematology/oncology specialist. These are physicians who specialize in  blood disorders and cancers, or findings concerning for cancer. You will receive a phone call from the [facility] Office to set up your appointment within 2 business days: [employer] operate Mon - Fri, 8:00 a.m. to 5:00 p.m.; closed for federally recognized holidays. Please be sure your phone is not set to block numbers during this time. You may call [phone] at any point during [facility] Office hours of operation to make an appointment as well.   05/08/23 1707              Linwood Dibbles, MD 05/08/23 1711

## 2023-05-08 NOTE — Discharge Instructions (Signed)
Take the medications as prescribed to treat possible lung infection.  The CT scan did show a possible new lung mass.  Follow-up with your oncologist.  I did place a referral so they should call you back

## 2023-05-08 NOTE — ED Triage Notes (Signed)
C/o right sided chest tightness with sob.  Hx stage 4 lung cancer and CVA.

## 2023-05-16 ENCOUNTER — Encounter (HOSPITAL_COMMUNITY): Payer: Self-pay

## 2023-05-16 ENCOUNTER — Emergency Department (HOSPITAL_COMMUNITY)
Admission: EM | Admit: 2023-05-16 | Discharge: 2023-05-16 | Disposition: A | Discharge status: Home / Self Care | Payer: Medicaid Other | Attending: Emergency Medicine | Admitting: Emergency Medicine

## 2023-05-16 ENCOUNTER — Emergency Department (HOSPITAL_COMMUNITY): Payer: Medicaid Other

## 2023-05-16 ENCOUNTER — Other Ambulatory Visit: Payer: Self-pay

## 2023-05-16 DIAGNOSIS — R600 Localized edema: Secondary | ICD-10-CM | POA: Diagnosis not present

## 2023-05-16 DIAGNOSIS — M79604 Pain in right leg: Secondary | ICD-10-CM | POA: Diagnosis present

## 2023-05-16 DIAGNOSIS — J45909 Unspecified asthma, uncomplicated: Secondary | ICD-10-CM | POA: Insufficient documentation

## 2023-05-16 DIAGNOSIS — Z85118 Personal history of other malignant neoplasm of bronchus and lung: Secondary | ICD-10-CM | POA: Diagnosis not present

## 2023-05-16 DIAGNOSIS — I10 Essential (primary) hypertension: Secondary | ICD-10-CM | POA: Diagnosis not present

## 2023-05-16 DIAGNOSIS — E876 Hypokalemia: Secondary | ICD-10-CM | POA: Insufficient documentation

## 2023-05-16 DIAGNOSIS — J449 Chronic obstructive pulmonary disease, unspecified: Secondary | ICD-10-CM | POA: Diagnosis not present

## 2023-05-16 LAB — CBC WITH DIFFERENTIAL/PLATELET
Abs Immature Granulocytes: 0.01 10*3/uL (ref 0.00–0.07)
Basophils Absolute: 0 10*3/uL (ref 0.0–0.1)
Basophils Relative: 0 %
Eosinophils Absolute: 0.1 10*3/uL (ref 0.0–0.5)
Eosinophils Relative: 2 %
HCT: 42.9 % (ref 39.0–52.0)
Hemoglobin: 13.2 g/dL (ref 13.0–17.0)
Immature Granulocytes: 0 %
Lymphocytes Relative: 13 %
Lymphs Abs: 1 10*3/uL (ref 0.7–4.0)
MCH: 29.2 pg (ref 26.0–34.0)
MCHC: 30.8 g/dL (ref 30.0–36.0)
MCV: 94.9 fL (ref 80.0–100.0)
Monocytes Absolute: 0.5 10*3/uL (ref 0.1–1.0)
Monocytes Relative: 6 %
Neutro Abs: 6 10*3/uL (ref 1.7–7.7)
Neutrophils Relative %: 79 %
Platelets: 196 10*3/uL (ref 150–400)
RBC: 4.52 MIL/uL (ref 4.22–5.81)
RDW: 14.6 % (ref 11.5–15.5)
WBC: 7.7 10*3/uL (ref 4.0–10.5)
nRBC: 0 % (ref 0.0–0.2)

## 2023-05-16 LAB — BASIC METABOLIC PANEL
Anion gap: 11 (ref 5–15)
BUN: 18 mg/dL (ref 8–23)
CO2: 29 mmol/L (ref 22–32)
Calcium: 9.4 mg/dL (ref 8.9–10.3)
Chloride: 97 mmol/L — ABNORMAL LOW (ref 98–111)
Creatinine, Ser: 0.68 mg/dL (ref 0.61–1.24)
GFR, Estimated: >60 mL/min (ref >60)
Glucose, Bld: 89 mg/dL (ref 70–99)
Potassium: 3.4 mmol/L — ABNORMAL LOW (ref 3.5–5.1)
Sodium: 137 mmol/L (ref 135–145)

## 2023-05-16 LAB — BRAIN NATRIURETIC PEPTIDE: B Natriuretic Peptide: 28.8 pg/mL (ref 0.0–100.0)

## 2023-05-16 NOTE — ED Triage Notes (Signed)
C/o right leg swelling x2 days with blistering.  Denies cp/sob

## 2023-05-16 NOTE — ED Notes (Signed)
Patient and family given discharge instructions and follow up care. Patient verbalized understanding. IV removed with catheter intact. Patient taken out of ED via wheelchair.

## 2023-05-16 NOTE — ED Provider Notes (Signed)
Emergency Department Provider Note   I have reviewed the triage vital signs and the nursing notes.   HISTORY  Chief Complaint Leg Swelling   HPI Todd Estrada is a 62 y.o. male with PMH reviewed below presents emergency department for evaluation of right leg pain and swelling.  In discussion with patient, he is actually having pain in both legs but worse on the right.  Some slight rash noted as well.  Denies falls. No CP or SOB.   Past Medical History:  Diagnosis Date   Asthma    Atypical chest pain 08/13/2022   Community acquired pneumonia 09/14/2022   COPD (chronic obstructive pulmonary disease) (HCC)    Essential hypertension 08/19/2021   GERD (gastroesophageal reflux disease)    HAP (hospital-acquired pneumonia) 09/16/2022   History of tracheostomy    03/09/22-04/11/22   HLD (hyperlipidemia)    Hypertension    Hypokalemia 08/13/2022   Hypomagnesemia 08/13/2022   Lung cancer (HCC)    PAD (peripheral artery disease) (HCC)    Seizures (HCC) 06/02/2022   Sepsis (HCC) 08/13/2022   Stroke (HCC) 02/2022    Review of Systems  Constitutional: No fever/chills Cardiovascular: Denies chest pain. Respiratory: Denies shortness of breath. Gastrointestinal: No abdominal pain.   Genitourinary: Negative for dysuria. Musculoskeletal: Positive bilateral leg pain, worse on the right.  Skin: No appreciable blistering rash.  Neurological: Negative for headaches, focal weakness or numbness.   ____________________________________________   PHYSICAL EXAM:  VITAL SIGNS: ED Triage Vitals  Enc Vitals Group     BP 05/16/23 1607 126/83     Pulse Rate 05/16/23 1607 85     Resp 05/16/23 1607 16     Temp 05/16/23 1607 98.8 F (37.1 C)     Temp Source 05/16/23 1607 Oral     SpO2 05/16/23 1607 100 %   Constitutional: Alert and oriented. Well appearing and in no acute distress. Eyes: Conjunctivae are normal.  Head: Atraumatic. Nose: No congestion/rhinnorhea. Mouth/Throat:  Mucous membranes are moist. Neck: No stridor.   Cardiovascular: Normal rate, regular rhythm. Good peripheral circulation. Grossly normal heart sounds.   Respiratory: Normal respiratory effort.  No retractions. Lungs CTAB. Gastrointestinal: No distention.  Musculoskeletal: Normal ROM of the bilateral LEs. No joint effusions.  Neurologic:  No gross focal neurologic deficits are appreciated.  Skin:  Skin is warm, dry and intact. No rash noted.  ____________________________________________   LABS (all labs ordered are listed, but only abnormal results are displayed)  Labs Reviewed  BASIC METABOLIC PANEL - Abnormal; Notable for the following components:      Result Value   Potassium 3.4 (*)    Chloride 97 (*)    All other components within normal limits  CBC WITH DIFFERENTIAL/PLATELET  BRAIN NATRIURETIC PEPTIDE   ____________________________________________  RADIOLOGY  DG Ankle Complete Right  Result Date: 05/16/2023 CLINICAL DATA:  leg pain EXAM: RIGHT ANKLE - COMPLETE 3+ VIEW COMPARISON:  None Available. FINDINGS: There is no evidence of fracture, dislocation, or joint effusion. Small calcaneal spur. There is no evidence of arthropathy or other focal bone abnormality. Soft tissues are unremarkable. IMPRESSION: Negative. Electronically Signed   By: Corlis Leak M.D.   On: 05/16/2023 20:09   DG Ankle Complete Left  Result Date: 05/16/2023 CLINICAL DATA:  leg pain EXAM: LEFT ANKLE COMPLETE - 3+ VIEW COMPARISON:  None Available. FINDINGS: There is no evidence of fracture, dislocation, or joint effusion. Small calcaneal spur. There is no evidence of arthropathy or other focal bone abnormality. Soft tissues  are unremarkable. IMPRESSION: Negative. Electronically Signed   By: Corlis Leak M.D.   On: 05/16/2023 20:09   DG Knee Complete 4 Views Right  Result Date: 05/16/2023 CLINICAL DATA:  leg pain EXAM: RIGHT KNEE - COMPLETE 4+ VIEW COMPARISON:  None Available. FINDINGS: No evidence of  fracture, dislocation, or joint effusion. Small traction spurs from patella. No evidence of arthropathy or other focal bone abnormality. Scattered arterial calcifications. Soft tissues are unremarkable. IMPRESSION: Negative. Electronically Signed   By: Corlis Leak M.D.   On: 05/16/2023 20:08   DG Knee Complete 4 Views Left  Result Date: 05/16/2023 CLINICAL DATA:  leg pain EXAM: LEFT KNEE - COMPLETE 4+ VIEW COMPARISON:  None Available. FINDINGS: No evidence of fracture, dislocation, or joint effusion. No evidence of arthropathy or other focal bone abnormality. Patchy femoral-popliteal arterial calcifications. Soft tissues are unremarkable. IMPRESSION: Negative. Electronically Signed   By: Corlis Leak M.D.   On: 05/16/2023 20:07    ____________________________________________   PROCEDURES  Procedure(s) performed:   Procedures  None  ____________________________________________   INITIAL IMPRESSION / ASSESSMENT AND PLAN / ED COURSE  Pertinent labs & imaging results that were available during my care of the patient were reviewed by me and considered in my medical decision making (see chart for details).   This patient is Presenting for Evaluation of leg pain, which does require a range of treatment options, and is a complaint that involves a high risk of morbidity and mortality.  The Differential Diagnoses include MSK pain, strain, fracture, DVT, cellulitis, etc.  I decided to review pertinent External Data, and in summary patient with admit in late April 2024.Marland Kitchen   Clinical Laboratory Tests Ordered, included BMP without AKI. BNP normal. CBC without anemia.   Radiologic Tests Ordered, included XR bilateral knee and ankles. I independently interpreted the images and agree with radiology interpretation.   Cardiac Monitor Tracing which shows NSR.    Social Determinants of Health Risk patient is not an active smoker.   Medical Decision Making: Summary:  Patient presents emergency  department with bilateral leg pain, worse on the right.  No appreciable rash.  No chest pain or shortness of breath.  Lower suspicion for DVT although will likely need ultrasound which we do not have at this hour.  Reevaluation with update and discussion with patient. Will need to return tomorrow for vascular US.   Patient's presentation is most consistent with acute presentation with potential threat to life or bodily function.   Disposition: discharge  ____________________________________________  FINAL CLINICAL IMPRESSION(S) / ED DIAGNOSES  Final diagnoses:  Leg edema    Note:  This document was prepared using Dragon voice recognition software and may include unintentional dictation errors.  Alona Bene, MD, [facility] Emergency Medicine    Kingsley Farace, Arlyss Repress, MD 05/16/23 [phone]

## 2023-05-16 NOTE — Discharge Instructions (Signed)
IMPORTANT PATIENT INSTRUCTIONS:  You have been scheduled for an Outpatient Vascular Study at [facility].    If tomorrow is a Saturday, Sunday or holiday, please go to the [place] Emergency Department Registration Desk at 11 am tomorrow morning and tell them you are there for a vascular study.   If tomorrow is a weekday (Monday-Friday), please go to [facility] Registration at 11 am and tell them you are there for a vascular study. 

## 2023-05-17 ENCOUNTER — Ambulatory Visit (HOSPITAL_COMMUNITY): Admission: RE | Admit: 2023-05-17 | Payer: Medicaid Other | Source: Ambulatory Visit

## 2023-05-19 ENCOUNTER — Encounter: Payer: Self-pay | Admitting: General Practice

## 2023-05-19 ENCOUNTER — Ambulatory Visit (HOSPITAL_COMMUNITY)
Admission: RE | Admit: 2023-05-19 | Discharge: 2023-05-19 | Disposition: A | Discharge status: Home / Self Care | Payer: Medicaid Other | Source: Ambulatory Visit | Attending: Emergency Medicine | Admitting: Emergency Medicine

## 2023-05-19 DIAGNOSIS — M7989 Other specified soft tissue disorders: Secondary | ICD-10-CM

## 2023-05-22 ENCOUNTER — Encounter: Payer: Self-pay | Admitting: *Deleted

## 2023-05-24 ENCOUNTER — Emergency Department (HOSPITAL_COMMUNITY): Payer: Medicaid Other

## 2023-05-24 ENCOUNTER — Encounter (HOSPITAL_COMMUNITY): Payer: Self-pay

## 2023-05-24 ENCOUNTER — Other Ambulatory Visit: Payer: Self-pay

## 2023-05-24 ENCOUNTER — Emergency Department (HOSPITAL_COMMUNITY)
Admission: EM | Admit: 2023-05-24 | Discharge: 2023-05-25 | Disposition: A | Discharge status: Home / Self Care | Payer: Medicaid Other | Attending: Emergency Medicine | Admitting: Emergency Medicine

## 2023-05-24 DIAGNOSIS — Z7901 Long term (current) use of anticoagulants: Secondary | ICD-10-CM | POA: Insufficient documentation

## 2023-05-24 DIAGNOSIS — T148XXA Other injury of unspecified body region, initial encounter: Secondary | ICD-10-CM

## 2023-05-24 DIAGNOSIS — M25522 Pain in left elbow: Secondary | ICD-10-CM | POA: Diagnosis present

## 2023-05-24 DIAGNOSIS — W19XXXA Unspecified fall, initial encounter: Secondary | ICD-10-CM

## 2023-05-24 DIAGNOSIS — W1830XA Fall on same level, unspecified, initial encounter: Secondary | ICD-10-CM | POA: Insufficient documentation

## 2023-05-24 DIAGNOSIS — Z7902 Long term (current) use of antithrombotics/antiplatelets: Secondary | ICD-10-CM | POA: Insufficient documentation

## 2023-05-24 MED ORDER — ACETAMINOPHEN 500 MG PO TABS
1000.0000 mg | ORAL_TABLET | Freq: Once | ORAL | Status: AC
  Administered 2023-05-24: 1000 mg via ORAL
  Filled 2023-05-24: qty 2

## 2023-05-24 NOTE — ED Triage Notes (Signed)
Pt had fall 10 minutes PTA, hit right elbow and has a skin tear. Pt fell onto knees. Pt has weakness due to prior stroke and this is why he fell. Denies hitting head, denies LOC. Pt is on blood thinners.

## 2023-05-25 ENCOUNTER — Emergency Department (HOSPITAL_COMMUNITY): Payer: Medicaid Other

## 2023-05-25 NOTE — ED Provider Notes (Signed)
[place] EMERGENCY DEPARTMENT AT [facility] Provider Note   CSN: [id number] Arrival date & time: 05/24/23  2133     History  Chief Complaint  Patient presents with   Fall   Elbow Pain    Todd Estrada is a 62 y.o. male.  The history is provided by the patient.  Fall This is a new problem. The current episode started less than 1 hour ago. The problem occurs constantly. The problem has not changed since onset.Pertinent negatives include no chest pain, no abdominal pain, no headaches and no shortness of breath. Nothing aggravates the symptoms. Nothing relieves the symptoms. He has tried nothing for the symptoms. The treatment provided no relief.  Fall onto left elbow with pain and abrasion.  Did not hit head but is on blood thinners      Home Medications Prior to Admission medications   Medication Sig Start Date End Date Taking? Authorizing Provider  albuterol (PROVENTIL) (2.5 MG/3ML) 0.083% nebulizer solution Take 3 mLs (2.5 mg total) by nebulization every 4 (four) hours as needed for wheezing or shortness of breath. Patient not taking: Reported on 04/13/2023 08/16/22   Steffanie Rainwater, MD  apixaban (ELIQUIS) 5 MG TABS tablet TAKE 1 TABLET(5 MG) BY MOUTH TWICE DAILY Patient taking differently: Take 5 mg by mouth 2 (two) times daily. 04/11/23   Merrilyn Puma, MD  atorvastatin (LIPITOR) 40 MG tablet Take 1 tablet (40 mg total) by mouth daily. 01/03/23 01/03/24  Merrilyn Puma, MD  budesonide (PULMICORT) 0.5 MG/2ML nebulizer solution NEW PRESCRIPTION REQEUST: BUDESONIDE 0.5 MG/ - USE ONE VIAL TWICE DAILY Patient taking differently: Take 0.5 mg by nebulization in the morning and at bedtime. 11/29/22   Martina Sinner, MD  clopidogrel (PLAVIX) 75 MG tablet Take 75 mg by mouth daily.    [provider]  folic acid (FOLVITE) 1 MG tablet Take 1 tablet (1 mg total) by mouth daily. 03/21/23   Merrilyn Puma, MD  levETIRAcetam (KEPPRA) 500 MG tablet Take 1 tablet  (500 mg total) by mouth 2 (two) times daily. 12/07/22   Ihor Austin, NP  midodrine (PROAMATINE) 2.5 MG tablet Take 1 tablet (2.5 mg total) by mouth 3 (three) times daily with meals. 01/06/23 07/05/23  Merrilyn Puma, MD  Multiple Vitamin (MULTIVITAMIN WITH MINERALS) TABS tablet Take 1 tablet by mouth daily. Patient taking differently: Take 1 tablet by mouth daily with breakfast. 09/20/22   Doran Stabler, DO  Tiotropium Bromide-Olodaterol (STIOLTO RESPIMAT) 2.5-2.5 MCG/ACT AERS NEW PRESCRIPTION REQUEST: STIOLTO 2.5 MCG- INHALE TWO PUFFS BY MOUTH DAILY Patient taking differently: Inhale 2 each into the lungs daily. 11/29/22   Martina Sinner, MD      Allergies    Patient has no known allergies.    Review of Systems   Review of Systems  Constitutional:  Negative for fever.  HENT:  Negative for facial swelling.   Eyes:  Negative for visual disturbance.  Respiratory:  Negative for shortness of breath.   Cardiovascular:  Negative for chest pain.  Gastrointestinal:  Negative for abdominal pain.  Musculoskeletal:  Positive for arthralgias.  Neurological:  Negative for headaches.  All other systems reviewed and are negative.   Physical Exam Updated Vital Signs BP 127/76 (BP Location: Left Arm)   Pulse 88   Temp (!) 97.5 F (36.4 C) (Oral)   Resp 18   Ht 6' (1.829 m)   Wt 77 kg   SpO2 99%   BMI 23.02 kg/m  Physical Exam Vitals and  nursing note reviewed.  Constitutional:      General: He is not in acute distress.    Appearance: Normal appearance. He is well-developed. He is not diaphoretic.  HENT:     Head: Normocephalic and atraumatic.     Nose: Nose normal.  Eyes:     Conjunctiva/sclera: Conjunctivae normal.     Pupils: Pupils are equal, round, and reactive to light.  Cardiovascular:     Rate and Rhythm: Normal rate and regular rhythm.     Pulses: Normal pulses.     Heart sounds: Normal heart sounds.  Pulmonary:     Effort: Pulmonary effort is normal.     Breath sounds:  Normal breath sounds. No wheezing or rales.  Abdominal:     General: Bowel sounds are normal.     Palpations: Abdomen is soft.     Tenderness: There is no abdominal tenderness. There is no guarding or rebound.  Musculoskeletal:        General: Normal range of motion.     Cervical back: Normal range of motion and neck supple.  Skin:    General: Skin is warm and dry.     Capillary Refill: Capillary refill takes less than 2 seconds.  Neurological:     General: No focal deficit present.     Mental Status: He is alert and oriented to person, place, and time.     Deep Tendon Reflexes: Reflexes normal.  Psychiatric:        Mood and Affect: Mood normal.        Behavior: Behavior normal.     ED Results / Procedures / Treatments   Labs (all labs ordered are listed, but only abnormal results are displayed) Labs Reviewed - No data to display  EKG None  Radiology CT Head Wo Contrast  Result Date: 05/25/2023 CLINICAL DATA:  Syncope/presyncope.  History of stroke EXAM: CT HEAD WITHOUT CONTRAST TECHNIQUE: Contiguous axial images were obtained from the base of the skull through the vertex without intravenous contrast. RADIATION DOSE REDUCTION: This exam was performed according to the departmental dose-optimization program which includes automated exposure control, adjustment of the mA and/or kV according to patient size and/or use of iterative reconstruction technique. COMPARISON:  CT head 04/14/2023 FINDINGS: Brain: No intracranial hemorrhage, mass effect, or evidence of acute infarct. No hydrocephalus. No extra-axial fluid collection. Generalized cerebral atrophy. Ill-defined hypoattenuation within the cerebral white matter is nonspecific but consistent with chronic small vessel ischemic disease. Temporal evolution of the large chronic left MCA territory infarct with cortical laminar necrosis. Chronic left cerebellar infarct. Chronic left basal ganglia infarcts. Vascular: No hyperdense vessel.  Intracranial arterial calcification. Skull: No fracture or focal lesion. Unchanged chronic soft tissue thickening along the midline parietal scalp on the right. Sinuses/Orbits: No acute finding. Complete opacification of the right maxillary sinus is chronic. Chronic trace left mastoid effusion. Other: None. IMPRESSION: 1. No acute intracranial abnormality. 2. Chronic small vessel ischemic disease and cerebral atrophy and chronic infarcts as described. Electronically Signed   By: Minerva Fester M.D.   On: 05/25/2023 00:24   DG Ribs Unilateral W/Chest Right  Result Date: 05/24/2023 CLINICAL DATA:  Fall with pain. EXAM: RIGHT RIBS AND CHEST - 3+ VIEW COMPARISON:  05/08/2023. FINDINGS: No acute displaced fracture or other bone lesions are seen involving the ribs. Degenerative changes are present in the thoracic spine. There is no evidence of pneumothorax or pleural effusion. Stable bandlike opacity in the hilar region on the left with chronic elevation of the  left diaphragm. There is a nodular density in the mid right lung with fiducial marker, unchanged. Heart size and mediastinal contours are within normal limits. There is atherosclerotic calcification of the aorta. IMPRESSION: 1. No acute displaced rib fracture. 2. Otherwise, stable chest. Electronically Signed   By: Thornell Sartorius M.D.   On: 05/24/2023 23:35   DG Elbow Complete Right  Result Date: 05/24/2023 CLINICAL DATA:  Fall, pain EXAM: RIGHT ELBOW - COMPLETE 3+ VIEW COMPARISON:  None Available. FINDINGS: The bones are osteopenic. There is minimal cortical irregularity of the lateral malleolus which may be related to old injury. No definite acute fracture or dislocation. Joint spaces are maintained. There is no significant joint effusion. IMPRESSION: 1. No definite acute fracture or dislocation. 2. Osteopenia. Electronically Signed   By: Darliss Cheney M.D.   On: 05/24/2023 23:30    Procedures Procedures    Medications Ordered in ED Medications   acetaminophen (TYLENOL) tablet 1,000 mg (1,000 mg Oral Given 05/24/23 2355)    ED Course/ Medical Decision Making/ A&P                             Medical Decision Making Patient with left elbow pain post fall  Amount and/or Complexity of Data Reviewed External Data Reviewed: notes.    Details: Previous notes reviewed  Radiology: ordered and independent interpretation performed.    Details: Negative CT and XR of the elbow   Risk OTC drugs. Risk Details: Well appearing,  stable for discharge with close follow up.     Final Clinical Impression(s) / ED Diagnoses Return for intractable cough, coughing up blood, fevers > 100.4 unrelieved by medication, shortness of breath, intractable vomiting, chest pain, shortness of breath, weakness, numbness, changes in speech, facial asymmetry, abdominal pain, passing out, Inability to tolerate liquids or food, cough, altered mental status or any concerns. No signs of systemic illness or infection. The patient is nontoxic-appearing on exam and vital signs are within normal limits.  I have reviewed the triage vital signs and the nursing notes. Pertinent labs & imaging results that were available during my care of the patient were reviewed by me and considered in my medical decision making (see chart for details). After history, exam, and medical workup I feel the patient has been appropriately medically screened and is safe for discharge home. Pertinent diagnoses were discussed with the patient. Patient was given return precautions      Kass Herberger, MD 05/25/23 1610

## 2023-06-01 ENCOUNTER — Encounter: Payer: Self-pay | Admitting: Student

## 2023-06-01 ENCOUNTER — Other Ambulatory Visit: Payer: Self-pay

## 2023-06-01 ENCOUNTER — Ambulatory Visit: Payer: Medicaid Other | Admitting: Student

## 2023-06-01 VITALS — BP 101/67 | HR 78 | Temp 98.7°F | Ht 72.0 in | Wt 173.3 lb

## 2023-06-01 DIAGNOSIS — Z8673 Personal history of transient ischemic attack (TIA), and cerebral infarction without residual deficits: Secondary | ICD-10-CM

## 2023-06-01 DIAGNOSIS — R296 Repeated falls: Secondary | ICD-10-CM | POA: Diagnosis not present

## 2023-06-01 DIAGNOSIS — I9589 Other hypotension: Secondary | ICD-10-CM

## 2023-06-01 DIAGNOSIS — E785 Hyperlipidemia, unspecified: Secondary | ICD-10-CM | POA: Diagnosis not present

## 2023-06-01 DIAGNOSIS — Z87898 Personal history of other specified conditions: Secondary | ICD-10-CM

## 2023-06-01 DIAGNOSIS — I5022 Chronic systolic (congestive) heart failure: Secondary | ICD-10-CM

## 2023-06-01 DIAGNOSIS — I739 Peripheral vascular disease, unspecified: Secondary | ICD-10-CM | POA: Diagnosis not present

## 2023-06-01 NOTE — Assessment & Plan Note (Signed)
BP 101/67. On midodrine 2.5mg  TID AC. Continue current therapy.

## 2023-06-01 NOTE — Assessment & Plan Note (Addendum)
History of prior CVA with residual dysarthria. He is wheelchair/bedbound at baseline. Current on plavix, eliquis, and lipitor for secondary prevention. Has neurology follow up in August.

## 2023-06-01 NOTE — Assessment & Plan Note (Signed)
GDMT historically limited due to chronic hypotension. He is euvolemic on exam today.

## 2023-06-01 NOTE — Progress Notes (Signed)
   CC: f/u frequent falls  HPI:  [name] is a 62 y.o. male with history listed below presenting to the [facility] for f/u frequent falls. Please see individualized problem based charting for full HPI.  Past Medical History:  Diagnosis Date   Asthma    Atypical chest pain 08/13/2022   Community acquired pneumonia 09/14/2022   COPD (chronic obstructive pulmonary disease) (HCC)    Essential hypertension 08/19/2021   GERD (gastroesophageal reflux disease)    HAP (hospital-acquired pneumonia) 09/16/2022   History of tracheostomy    03/09/22-04/11/22   HLD (hyperlipidemia)    Hypertension    Hypokalemia 08/13/2022   Hypomagnesemia 08/13/2022   Lung cancer (HCC)    PAD (peripheral artery disease) (HCC)    Seizures (HCC) 06/02/2022   Sepsis (HCC) 08/13/2022   Sepsis due to pneumonia (HCC) 04/13/2023   Stroke (HCC) 02/2022    Review of Systems:  Negative aside from that listed in individualized problem based charting.  Physical Exam:  Vitals:   06/01/23 1410  BP: 101/67  Pulse: 78  Temp: 98.7 F (37.1 C)  TempSrc: Oral  SpO2: 97%  Weight: 173 lb 4.8 oz (78.6 kg)  Height: 6' (1.829 m)   Physical Exam Constitutional:      Appearance: He is normal weight.     Comments: Chronically ill-appearing.  HENT:     Mouth/Throat:     Mouth: Mucous membranes are moist.     Pharynx: Oropharynx is clear. No oropharyngeal exudate.  Eyes:     General: No scleral icterus.    Extraocular Movements: Extraocular movements intact.     Conjunctiva/sclera: Conjunctivae normal.  Cardiovascular:     Rate and Rhythm: Normal rate and regular rhythm.  Pulmonary:     Effort: Pulmonary effort is normal.     Breath sounds: Normal breath sounds. No wheezing, rhonchi or rales.  Abdominal:     General: Bowel sounds are normal. There is no distension.     Palpations: Abdomen is soft.     Tenderness: There is no abdominal tenderness.  Musculoskeletal:        General: No swelling.  Skin:     General: Skin is warm and dry.  Neurological:     Mental Status: He is alert. Mental status is at baseline.     Comments: Dysarthric speech, chronic.       Assessment & Plan:   See Encounters Tab for problem based charting.  Patient discussed with Dr. Oswaldo Done

## 2023-06-01 NOTE — Assessment & Plan Note (Signed)
LDL 60 in 02/2022. On lipitor 40mg  daily. Please repeat lipid panel with next blood draw.

## 2023-06-01 NOTE — Assessment & Plan Note (Signed)
Patient presenting today after recent fall during bed transfer to bedside commode. He has had 4 falls in the past year, all of them during similar transfers. He is bedbound/wheelchair-bound at baseline. Has a 3-in-1 bedside commode that he uses. Cousin present at visit, asking for drop-arm bedside commode to facilitate easier transfers from bed to prevent falls, which is reasonable. He is on plavix and eliquis given history of CVA, multiple PE, and active SCC of lung.   -placed referral for DME (drop-arm bedside commode) -continue checking for ways to improve safety at home at subsequent visits given need for long-term antiplatelet and DOAC therapy

## 2023-06-01 NOTE — Patient Instructions (Addendum)
Todd Estrada,  It was a pleasure seeing you in the clinic today.   I have placed an order for a drop-arm bedside commode. I am hopeful that this can be delivered to you soon, but I am unsure as to the exact timing behind this. Please call the number below to schedule your colonoscopy. Please come back in 1 month for your next visit.  [facility] Located in: [facility] Address: [address] [place] [address], [place], [place] [address] Phone: [phone]  Please call our clinic at [phone] if you have any questions or concerns. The best time to call is Monday-Friday from 9am-4pm, but there is someone available 24/7 at the same number. If you need medication refills, please notify your pharmacy one week in advance and they will send [place] a request.   Thank you for letting us take part in your care. We look forward to seeing you next time!

## 2023-06-01 NOTE — Assessment & Plan Note (Signed)
No recent seizure activity. Continue keppra 500mg  BID. Has neurology follow up in August.

## 2023-06-02 ENCOUNTER — Encounter: Payer: Self-pay | Admitting: Nurse Practitioner

## 2023-06-02 NOTE — Progress Notes (Signed)
Internal Medicine Clinic Attending  Case discussed with Dr. Austin Miles  At the time of the visit.  We reviewed the resident's history and exam and pertinent patient test results.  I agree with the assessment, diagnosis, and plan of care documented in the resident's note.   We re-assessed the risks and benefits of anticoagulation and anitplatelets in the setting of falls at home and increased frailty. In his case, we think that the benefits of these medicines continue to outweigh the risks. This is because his risk of recurrent VTE and cerebral infarctions are high, and he has not yet had a bleed on anticoagulation, so bleeding risk is still average. We are working to lower his risk of falls as much as we can with DME and PT, it is unfortunate he does not have more family support at home.

## 2023-06-03 ENCOUNTER — Emergency Department (HOSPITAL_COMMUNITY): Payer: Medicaid Other

## 2023-06-03 ENCOUNTER — Emergency Department (HOSPITAL_COMMUNITY)
Admission: EM | Admit: 2023-06-03 | Discharge: 2023-06-03 | Disposition: A | Discharge status: Home / Self Care | Payer: Medicaid Other | Attending: Emergency Medicine | Admitting: Emergency Medicine

## 2023-06-03 ENCOUNTER — Emergency Department (HOSPITAL_COMMUNITY)
Admission: EM | Admit: 2023-06-03 | Discharge: 2023-06-03 | Disposition: A | Discharge status: Home / Self Care | Payer: Medicaid Other | Source: Home / Self Care | Attending: Emergency Medicine | Admitting: Emergency Medicine

## 2023-06-03 ENCOUNTER — Other Ambulatory Visit: Payer: Self-pay

## 2023-06-03 DIAGNOSIS — Z86711 Personal history of pulmonary embolism: Secondary | ICD-10-CM | POA: Diagnosis not present

## 2023-06-03 DIAGNOSIS — Z7902 Long term (current) use of antithrombotics/antiplatelets: Secondary | ICD-10-CM | POA: Insufficient documentation

## 2023-06-03 DIAGNOSIS — J449 Chronic obstructive pulmonary disease, unspecified: Secondary | ICD-10-CM | POA: Insufficient documentation

## 2023-06-03 DIAGNOSIS — J432 Centrilobular emphysema: Secondary | ICD-10-CM | POA: Diagnosis not present

## 2023-06-03 DIAGNOSIS — Z79899 Other long term (current) drug therapy: Secondary | ICD-10-CM | POA: Insufficient documentation

## 2023-06-03 DIAGNOSIS — R079 Chest pain, unspecified: Secondary | ICD-10-CM | POA: Insufficient documentation

## 2023-06-03 DIAGNOSIS — Z85118 Personal history of other malignant neoplasm of bronchus and lung: Secondary | ICD-10-CM | POA: Insufficient documentation

## 2023-06-03 DIAGNOSIS — G40909 Epilepsy, unspecified, not intractable, without status epilepticus: Secondary | ICD-10-CM | POA: Diagnosis not present

## 2023-06-03 DIAGNOSIS — I10 Essential (primary) hypertension: Secondary | ICD-10-CM | POA: Insufficient documentation

## 2023-06-03 DIAGNOSIS — R222 Localized swelling, mass and lump, trunk: Secondary | ICD-10-CM

## 2023-06-03 DIAGNOSIS — R918 Other nonspecific abnormal finding of lung field: Secondary | ICD-10-CM | POA: Insufficient documentation

## 2023-06-03 DIAGNOSIS — Z7901 Long term (current) use of anticoagulants: Secondary | ICD-10-CM | POA: Insufficient documentation

## 2023-06-03 DIAGNOSIS — Z8673 Personal history of transient ischemic attack (TIA), and cerebral infarction without residual deficits: Secondary | ICD-10-CM | POA: Insufficient documentation

## 2023-06-03 LAB — BASIC METABOLIC PANEL
Anion gap: 9 (ref 5–15)
Anion gap: 9 (ref 5–15)
BUN: 17 mg/dL (ref 8–23)
BUN: 14 mg/dL (ref 8–23)
CO2: 29 mmol/L (ref 22–32)
CO2: 30 mmol/L (ref 22–32)
Calcium: 9.2 mg/dL (ref 8.9–10.3)
Calcium: 9.3 mg/dL (ref 8.9–10.3)
Chloride: 101 mmol/L (ref 98–111)
Chloride: 102 mmol/L (ref 98–111)
Creatinine, Ser: 0.98 mg/dL (ref 0.61–1.24)
Creatinine, Ser: 1.03 mg/dL (ref 0.61–1.24)
GFR, Estimated: >60 mL/min (ref >60)
GFR, Estimated: >60 mL/min (ref >60)
Glucose, Bld: 132 mg/dL — ABNORMAL HIGH (ref 70–99)
Glucose, Bld: 101 mg/dL — ABNORMAL HIGH (ref 70–99)
Potassium: 3.2 mmol/L — ABNORMAL LOW (ref 3.5–5.1)
Potassium: 4 mmol/L (ref 3.5–5.1)
Sodium: 139 mmol/L (ref 135–145)
Sodium: 141 mmol/L (ref 135–145)

## 2023-06-03 LAB — CBC
HCT: 41.5 % (ref 39.0–52.0)
HCT: 41.1 % (ref 39.0–52.0)
Hemoglobin: 13.1 g/dL (ref 13.0–17.0)
Hemoglobin: 12.9 g/dL — ABNORMAL LOW (ref 13.0–17.0)
MCH: 29.7 pg (ref 26.0–34.0)
MCH: 29.6 pg (ref 26.0–34.0)
MCHC: 31.6 g/dL (ref 30.0–36.0)
MCHC: 31.4 g/dL (ref 30.0–36.0)
MCV: 94.1 fL (ref 80.0–100.0)
MCV: 94.3 fL (ref 80.0–100.0)
Platelets: 153 10*3/uL (ref 150–400)
Platelets: 157 10*3/uL (ref 150–400)
RBC: 4.41 MIL/uL (ref 4.22–5.81)
RBC: 4.36 MIL/uL (ref 4.22–5.81)
RDW: 14.3 % (ref 11.5–15.5)
RDW: 14.4 % (ref 11.5–15.5)
WBC: 6.3 10*3/uL (ref 4.0–10.5)
WBC: 5.9 10*3/uL (ref 4.0–10.5)
nRBC: 0 % (ref 0.0–0.2)
nRBC: 0 % (ref 0.0–0.2)

## 2023-06-03 LAB — TROPONIN I (HIGH SENSITIVITY)
Troponin I (High Sensitivity): 5 ng/L (ref <18)
Troponin I (High Sensitivity): 5 ng/L (ref <18)
Troponin I (High Sensitivity): 5 ng/L (ref <18)
Troponin I (High Sensitivity): 5 ng/L (ref <18)

## 2023-06-03 MED ORDER — NITROGLYCERIN 0.4 MG SL SUBL: 0.4000 mg | SUBLINGUAL_TABLET | SUBLINGUAL | Status: DC | PRN

## 2023-06-03 MED ORDER — IOHEXOL 350 MG/ML SOLN
100.0000 mL | Freq: Once | INTRAVENOUS | Status: AC | PRN
  Administered 2023-06-03: 100 mL via INTRAVENOUS

## 2023-06-03 MED ORDER — OXYCODONE-ACETAMINOPHEN 5-325 MG PO TABS: 1.0000 | ORAL_TABLET | Freq: Four times a day (QID) | ORAL | 0 refills | Status: DC | PRN

## 2023-06-03 MED ORDER — MORPHINE SULFATE (PF) 4 MG/ML IV SOLN
4.0000 mg | Freq: Once | INTRAVENOUS | Status: AC
  Administered 2023-06-03: 4 mg via INTRAVENOUS
  Filled 2023-06-03: qty 1

## 2023-06-03 NOTE — ED Provider Notes (Signed)
[place] EMERGENCY DEPARTMENT AT [facility] Provider Note   CSN: [id number] Arrival date & time: 06/03/23  0256     History  Chief Complaint  Patient presents with   Chest Pain    Todd Estrada is a 62 y.o. male.  The history is provided by the patient and medical records.  Chest Pain  61 year old male with history of emphysema, history of PE on Eliquis, seizure disorder, peripheral arterial disease, dyslipidemia, history of right-sided lung cancer, presenting to the ED with right-sided chest pain.  States this woke him from sleep tonight, feels it all around the right side of his chest.  He denies any cough or shortness of breath.  He has not had any fever.  Denies any prior cardiac history.  He has had prior stroke with residual speech impairment.  He states he thinks he has been taking his Eliquis.  Of note, on chart review does appear he had CTA of the chest last month that was concerning for worsening neoplasm and right upper lobe.  He has not had follow-up with oncology since the scan was performed, did see them a few days prior to this ED visit.  Home Medications Prior to Admission medications   Medication Sig Start Date End Date Taking? Authorizing Provider  apixaban (ELIQUIS) 5 MG TABS tablet TAKE 1 TABLET(5 MG) BY MOUTH TWICE DAILY Patient taking differently: Take 5 mg by mouth 2 (two) times daily. 04/11/23  Yes Merrilyn Puma, MD  atorvastatin (LIPITOR) 40 MG tablet Take 1 tablet (40 mg total) by mouth daily. 01/03/23 01/03/24 Yes Merrilyn Puma, MD  budesonide (PULMICORT) 0.5 MG/2ML nebulizer solution NEW PRESCRIPTION REQEUST: BUDESONIDE 0.5 MG/ - USE ONE VIAL TWICE DAILY Patient taking differently: Take 0.5 mg by nebulization in the morning and at bedtime. 11/29/22  Yes Martina Sinner, MD  clopidogrel (PLAVIX) 75 MG tablet Take 75 mg by mouth daily.   Yes [provider]  levETIRAcetam (KEPPRA) 500 MG tablet Take 1 tablet (500 mg total) by  mouth 2 (two) times daily. 12/07/22  Yes McCue, Shanda Bumps, NP  midodrine (PROAMATINE) 2.5 MG tablet Take 1 tablet (2.5 mg total) by mouth 3 (three) times daily with meals. 01/06/23 07/05/23 Yes Merrilyn Puma, MD  Multiple Vitamin (MULTIVITAMIN WITH MINERALS) TABS tablet Take 1 tablet by mouth daily. Patient taking differently: Take 1 tablet by mouth daily with breakfast. 09/20/22  Yes Doran Stabler, DO  Tiotropium Bromide-Olodaterol (STIOLTO RESPIMAT) 2.5-2.5 MCG/ACT AERS NEW PRESCRIPTION REQUEST: STIOLTO 2.5 MCG- INHALE TWO PUFFS BY MOUTH DAILY Patient taking differently: Inhale 2 each into the lungs daily. 11/29/22  Yes Martina Sinner, MD  albuterol (PROVENTIL) (2.5 MG/3ML) 0.083% nebulizer solution Take 3 mLs (2.5 mg total) by nebulization every 4 (four) hours as needed for wheezing or shortness of breath. Patient not taking: Reported on 04/13/2023 08/16/22   Steffanie Rainwater, MD  folic acid (FOLVITE) 1 MG tablet Take 1 tablet (1 mg total) by mouth daily. Patient not taking: Reported on 06/03/2023 03/21/23   Merrilyn Puma, MD      Allergies    Patient has no known allergies.    Review of Systems   Review of Systems  Cardiovascular:  Positive for chest pain.  All other systems reviewed and are negative.   Physical Exam Updated Vital Signs BP (!) 150/81 (BP Location: Left Arm)   Pulse 77   Temp 98.2 F (36.8 C) (Oral)   Resp 18   Ht 6' (1.829 m)  Wt 78.6 kg   SpO2 100%   BMI 23.50 kg/m   Physical Exam Vitals and nursing note reviewed.  Constitutional:      Appearance: He is well-developed.  HENT:     Head: Normocephalic and atraumatic.  Eyes:     Conjunctiva/sclera: Conjunctivae normal.     Pupils: Pupils are equal, round, and reactive to light.  Cardiovascular:     Rate and Rhythm: Normal rate and regular rhythm.     Heart sounds: Normal heart sounds.  Pulmonary:     Effort: Pulmonary effort is normal.     Breath sounds: Normal breath sounds.  Abdominal:      General: Bowel sounds are normal.     Palpations: Abdomen is soft.  Musculoskeletal:        General: Normal range of motion.     Cervical back: Normal range of motion.     Comments: Brace right lower leg  Skin:    General: Skin is warm and dry.  Neurological:     Mental Status: He is alert and oriented to person, place, and time.     Comments: Dysarthric speech, baseline     ED Results / Procedures / Treatments   Labs (all labs ordered are listed, but only abnormal results are displayed) Labs Reviewed  BASIC METABOLIC PANEL - Abnormal; Notable for the following components:      Result Value   Potassium 3.2 (*)    Glucose, Bld 132 (*)    All other components within normal limits  CBC  TROPONIN I (HIGH SENSITIVITY)  TROPONIN I (HIGH SENSITIVITY)    EKG EKG Interpretation  Date/Time:  Saturday June 03 2023 03:07:21 EDT Ventricular Rate:  93 PR Interval:  160 QRS Duration: 88 QT Interval:  345 QTC Calculation: 430 R Axis:   83 Text Interpretation: Sinus rhythm Ventricular trigeminy Anteroseptal infarct, age indeterminate No significant change since last tracing Confirmed by Vivien Rossetti ([id number]) on 06/03/2023 3:13:53 AM  Radiology CT Angio Chest PE W and/or Wo Contrast  Result Date: 06/03/2023 CLINICAL DATA:  62 year old male with history of nontraumatic chest wall pain. EXAM: CT ANGIOGRAPHY CHEST WITH CONTRAST TECHNIQUE: Multidetector CT imaging of the chest was performed using the standard protocol during bolus administration of intravenous contrast. Multiplanar CT image reconstructions and MIPs were obtained to evaluate the vascular anatomy. RADIATION DOSE REDUCTION: This exam was performed according to the departmental dose-optimization program which includes automated exposure control, adjustment of the mA and/or kV according to patient size and/or use of iterative reconstruction technique. CONTRAST:  OMNIPAQUE IOHEXOL 350 MG/ML SOLN COMPARISON:  Chest CTA  05/08/2023. FINDINGS: Cardiovascular: No filling defects within the pulmonary arterial tree to suggest pulmonary embolism. Heart size is normal. There is no significant pericardial fluid, thickening or pericardial calcification. There is aortic atherosclerosis, as well as atherosclerosis of the great vessels of the mediastinum and the coronary arteries, including calcified atherosclerotic plaque in the left main, left anterior descending, left circumflex and right coronary arteries. Mediastinum/Nodes: No pathologically enlarged mediastinal or hilar lymph nodes. Esophagus is unremarkable in appearance. No axillary lymphadenopathy. Lungs/Pleura: Widespread but patchy areas of ground-glass attenuation, septal thickening, and nodular and mass-like architectural distortion are again noted throughout the lungs bilaterally, most confluent in the periphery of the right upper lobe where there is again a large mass-like area extending to the overlying pleural surface with internal fiducial marker or bronchial valve best appreciated on axial image 71 of series 12 measuring 7.2 x 2.9 cm on today's  study. Multiple other small calcified granulomas are noted, as well as noncalcified pulmonary nodules, similar to the recent prior study, with the largest noncalcified pulmonary nodule in the periphery of the left lower lobe (axial image 90 of series 12) measuring 9 x 5 mm (mean diameter of 7 mm), new compared to the prior study. No pleural effusions. Diffuse bronchial wall thickening with moderate to severe centrilobular and paraseptal emphysema. Upper Abdomen: Aortic atherosclerosis. Musculoskeletal: There are no aggressive appearing lytic or blastic lesions noted in the visualized portions of the skeleton. Review of the MIP images confirms the above findings. IMPRESSION: 1. No evidence of pulmonary embolism. 2. Extensive areas of what appear to be evolving post infectious or inflammatory scarring are again noted throughout the  lungs bilaterally. Notably, however, in the periphery of the right upper lobe there is an increasingly aggressive appearing mass-like area which currently measures 7.2 x 2.9 cm, concerning for progressive neoplasm. Tissue sampling or further evaluation with nonemergent PET-CT should be considered to exclude malignancy. 3. Diffuse bronchial wall thickening with moderate to severe centrilobular and paraseptal emphysema; imaging findings suggestive of underlying COPD. 4. Aortic atherosclerosis, in addition to left main and three-vessel coronary artery disease. Please note that although the presence of coronary artery calcium documents the presence of coronary artery disease, the severity of this disease and any potential stenosis cannot be assessed on this non-gated CT examination. Assessment for potential risk factor modification, dietary therapy or pharmacologic therapy may be warranted, if clinically indicated. Aortic Atherosclerosis (ICD10-I70.0) and Emphysema (ICD10-J43.9). Electronically Signed   By: Trudie Reed M.D.   On: 06/03/2023 05:57    Procedures Procedures    Medications Ordered in ED Medications  iohexol (OMNIPAQUE) 350 MG/ML injection 100 mL (100 mLs Intravenous Contrast Given 06/03/23 7829)    ED Course/ Medical Decision Making/ A&P                             Medical Decision Making Amount and/or Complexity of Data Reviewed Labs: ordered. Radiology: ordered and independent interpretation performed. ECG/medicine tests: ordered and independent interpretation performed.  Risk Prescription drug management.   62 year old male presenting to the ED with right-sided chest pain that woke him from sleep.  He denies any shortness of breath, cough, or fever.  He is currently on Eliquis, unclear if he is taking this as directed.  His EKG is nonischemic.  Labs are overall reassuring without leukocytosis or electrolyte derangement.  Troponin negative x 2.  He does have history of lung  cancer as well as PE, CTA was obtained that is concerning for progressive neoplasm in the right upper lobe.  This was seen on prior CT scan in May but appears to be increasing in size currently.  Does not appear to have any postobstructive pneumonia.    Patient was made aware of the findings and the fact that these are worsening.  I have messaged his oncologist, Dr. Shirline Frees, to try and expedite follow-up for him (was not due for appt until next months but likely would benefit from expedited follow-up).  I recommended that he monitor his symptoms and return here if any new or acute changes arise such as coughing up blood, severe shortness of breath, fevers, etc.  Final Clinical Impression(s) / ED Diagnoses Final diagnoses:  Chest pain in adult  Chest mass    Rx / DC Orders ED Discharge Orders     None  Garlon Hatchet, PA-C 06/03/23 0630    Mardene Sayer, MD 06/03/23 [phone]

## 2023-06-03 NOTE — ED Triage Notes (Addendum)
Patient reports waking up with right sided chest pain, does not radiate, denies any other symptoms with this. Denies any cardiac hx, has had previous stroke. Takes eliquis.

## 2023-06-03 NOTE — ED Provider Notes (Signed)
[place] EMERGENCY DEPARTMENT AT [facility] Provider Note   CSN: [id number] Arrival date & time: 06/03/23  1445     History  Chief Complaint  Patient presents with   Chest Pain    Todd Estrada is a 62 y.o. male with past medical history significant for hypertension, COPD, hyperlipidemia, acid reflux, peripheral arterial disease, lung cancer who presents with concern for ongoing right-sided chest pain, was seen last night for same.  He had a CT of the chest at the time which showed new masslike consolidation, no evidence of pulmonary embolus, he reports the pain is continued, he denies any shortness of breath.  He had follow-up scheduled with his oncologist.   Chest Pain      Home Medications Prior to Admission medications   Medication Sig Start Date End Date Taking? Authorizing Provider  oxyCODONE-acetaminophen (PERCOCET/ROXICET) 5-325 MG tablet Take 1 tablet by mouth every 6 (six) hours as needed for severe pain. 06/03/23  Yes Bayla Mcgovern H, PA-C  albuterol (PROVENTIL) (2.5 MG/3ML) 0.083% nebulizer solution Take 3 mLs (2.5 mg total) by nebulization every 4 (four) hours as needed for wheezing or shortness of breath. Patient not taking: Reported on 04/13/2023 08/16/22   Steffanie Rainwater, MD  apixaban (ELIQUIS) 5 MG TABS tablet TAKE 1 TABLET(5 MG) BY MOUTH TWICE DAILY Patient taking differently: Take 5 mg by mouth 2 (two) times daily. 04/11/23   Merrilyn Puma, MD  atorvastatin (LIPITOR) 40 MG tablet Take 1 tablet (40 mg total) by mouth daily. 01/03/23 01/03/24  Merrilyn Puma, MD  budesonide (PULMICORT) 0.5 MG/2ML nebulizer solution NEW PRESCRIPTION REQEUST: BUDESONIDE 0.5 MG/ - USE ONE VIAL TWICE DAILY Patient taking differently: Take 0.5 mg by nebulization in the morning and at bedtime. 11/29/22   Martina Sinner, MD  clopidogrel (PLAVIX) 75 MG tablet Take 75 mg by mouth daily.    [provider]  folic acid (FOLVITE) 1 MG tablet Take 1 tablet (1  mg total) by mouth daily. Patient not taking: Reported on 06/03/2023 03/21/23   Merrilyn Puma, MD  levETIRAcetam (KEPPRA) 500 MG tablet Take 1 tablet (500 mg total) by mouth 2 (two) times daily. 12/07/22   Ihor Austin, NP  midodrine (PROAMATINE) 2.5 MG tablet Take 1 tablet (2.5 mg total) by mouth 3 (three) times daily with meals. 01/06/23 07/05/23  Merrilyn Puma, MD  Multiple Vitamin (MULTIVITAMIN WITH MINERALS) TABS tablet Take 1 tablet by mouth daily. Patient taking differently: Take 1 tablet by mouth daily with breakfast. 09/20/22   Doran Stabler, DO  Tiotropium Bromide-Olodaterol (STIOLTO RESPIMAT) 2.5-2.5 MCG/ACT AERS NEW PRESCRIPTION REQUEST: STIOLTO 2.5 MCG- INHALE TWO PUFFS BY MOUTH DAILY Patient taking differently: Inhale 2 each into the lungs daily. 11/29/22   Martina Sinner, MD      Allergies    Patient has no known allergies.    Review of Systems   Review of Systems  Cardiovascular:  Positive for chest pain.  All other systems reviewed and are negative.   Physical Exam Updated Vital Signs BP 122/78   Pulse 68   Temp 97.8 F (36.6 C) (Oral)   Resp 14   Ht 6' (1.829 m)   Wt 78.6 kg   SpO2 99%   BMI 23.50 kg/m  Physical Exam Vitals and nursing note reviewed.  Constitutional:      General: He is not in acute distress.    Appearance: Normal appearance.  HENT:     Head: Normocephalic and atraumatic.  Eyes:  General:        Right eye: No discharge.        Left eye: No discharge.  Cardiovascular:     Rate and Rhythm: Normal rate and regular rhythm.     Heart sounds: No murmur heard.    No friction rub. No gallop.  Pulmonary:     Effort: Pulmonary effort is normal.     Breath sounds: Normal breath sounds.     Comments: No wheezing, rhonchi, stridor, rales on my exam, questionable consolidation left greater than right Abdominal:     General: Bowel sounds are normal.     Palpations: Abdomen is soft.  Skin:    General: Skin is warm and dry.     Capillary  Refill: Capillary refill takes less than 2 seconds.  Neurological:     General: No focal deficit present.     Mental Status: He is alert.     Comments: Patient with significant dysarthria which is his baseline, moving all 4 limbs spontaneously  Psychiatric:        Mood and Affect: Mood normal.        Behavior: Behavior normal.     ED Results / Procedures / Treatments   Labs (all labs ordered are listed, but only abnormal results are displayed) Labs Reviewed  BASIC METABOLIC PANEL - Abnormal; Notable for the following components:      Result Value   Glucose, Bld 101 (*)    All other components within normal limits  CBC - Abnormal; Notable for the following components:   Hemoglobin 12.9 (*)    All other components within normal limits  TROPONIN I (HIGH SENSITIVITY)  TROPONIN I (HIGH SENSITIVITY)    EKG EKG Interpretation  Date/Time:  Saturday June 03 2023 14:57:12 EDT Ventricular Rate:  81 PR Interval:  156 QRS Duration: 92 QT Interval:  349 QTC Calculation: 406 R Axis:   86 Text Interpretation: Sinus rhythm Confirmed by Virgina Norfolk ([id number]) on 06/03/2023 3:08:06 PM  Radiology DG Chest 2 View  Result Date: 06/03/2023 CLINICAL DATA:  chest pain. History a lung cancer status post radiation EXAM: CHEST - 2 VIEW COMPARISON:  June fifth, 2024 January 29, 2023 FINDINGS: The cardiomediastinal silhouette is unchanged in contour. No pleural effusion. No pneumothorax. Revisualization of a band like opacity in the LEFT mid lung, possibly the sequela of radiation. RIGHT lateral peripheral opacity with internal metallic marker, better assessed on same day CT. Both of these are increased in conspicuity since February 2024. Visualized abdomen is unremarkable. Multilevel degenerative changes of the thoracic spine. IMPRESSION: 1. Revisualization of a band like opacity in the LEFT mid lung, possibly the sequela of radiation. 2. RIGHT lateral peripheral opacity with internal metallic marker,  better assessed on same day CT. Electronically Signed   By: Meda Klinefelter M.D.   On: 06/03/2023 15:30   CT Angio Chest PE W and/or Wo Contrast  Result Date: 06/03/2023 CLINICAL DATA:  62 year old male with history of nontraumatic chest wall pain. EXAM: CT ANGIOGRAPHY CHEST WITH CONTRAST TECHNIQUE: Multidetector CT imaging of the chest was performed using the standard protocol during bolus administration of intravenous contrast. Multiplanar CT image reconstructions and MIPs were obtained to evaluate the vascular anatomy. RADIATION DOSE REDUCTION: This exam was performed according to the departmental dose-optimization program which includes automated exposure control, adjustment of the mA and/or kV according to patient size and/or use of iterative reconstruction technique. CONTRAST:  OMNIPAQUE IOHEXOL 350 MG/ML SOLN COMPARISON:  Chest CTA 05/08/2023. FINDINGS:  Cardiovascular: No filling defects within the pulmonary arterial tree to suggest pulmonary embolism. Heart size is normal. There is no significant pericardial fluid, thickening or pericardial calcification. There is aortic atherosclerosis, as well as atherosclerosis of the great vessels of the mediastinum and the coronary arteries, including calcified atherosclerotic plaque in the left main, left anterior descending, left circumflex and right coronary arteries. Mediastinum/Nodes: No pathologically enlarged mediastinal or hilar lymph nodes. Esophagus is unremarkable in appearance. No axillary lymphadenopathy. Lungs/Pleura: Widespread but patchy areas of ground-glass attenuation, septal thickening, and nodular and mass-like architectural distortion are again noted throughout the lungs bilaterally, most confluent in the periphery of the right upper lobe where there is again a large mass-like area extending to the overlying pleural surface with internal fiducial marker or bronchial valve best appreciated on axial image 71 of series 12 measuring 7.2 x  2.9 cm on today's study. Multiple other small calcified granulomas are noted, as well as noncalcified pulmonary nodules, similar to the recent prior study, with the largest noncalcified pulmonary nodule in the periphery of the left lower lobe (axial image 90 of series 12) measuring 9 x 5 mm (mean diameter of 7 mm), new compared to the prior study. No pleural effusions. Diffuse bronchial wall thickening with moderate to severe centrilobular and paraseptal emphysema. Upper Abdomen: Aortic atherosclerosis. Musculoskeletal: There are no aggressive appearing lytic or blastic lesions noted in the visualized portions of the skeleton. Review of the MIP images confirms the above findings. IMPRESSION: 1. No evidence of pulmonary embolism. 2. Extensive areas of what appear to be evolving post infectious or inflammatory scarring are again noted throughout the lungs bilaterally. Notably, however, in the periphery of the right upper lobe there is an increasingly aggressive appearing mass-like area which currently measures 7.2 x 2.9 cm, concerning for progressive neoplasm. Tissue sampling or further evaluation with nonemergent PET-CT should be considered to exclude malignancy. 3. Diffuse bronchial wall thickening with moderate to severe centrilobular and paraseptal emphysema; imaging findings suggestive of underlying COPD. 4. Aortic atherosclerosis, in addition to left main and three-vessel coronary artery disease. Please note that although the presence of coronary artery calcium documents the presence of coronary artery disease, the severity of this disease and any potential stenosis cannot be assessed on this non-gated CT examination. Assessment for potential risk factor modification, dietary therapy or pharmacologic therapy may be warranted, if clinically indicated. Aortic Atherosclerosis (ICD10-I70.0) and Emphysema (ICD10-J43.9). Electronically Signed   By: Trudie Reed M.D.   On: 06/03/2023 05:57     Procedures Procedures    Medications Ordered in ED Medications  nitroGLYCERIN (NITROSTAT) SL tablet 0.4 mg (has no administration in time range)  morphine (PF) 4 MG/ML injection 4 mg (4 mg Intravenous Given 06/03/23 1719)    ED Course/ Medical Decision Making/ A&P                             Medical Decision Making Amount and/or Complexity of Data Reviewed Labs: ordered. Radiology: ordered.  Risk Prescription drug management.   This patient is a 62 y.o. male  who presents to the ED for concern of chest pain.   Differential diagnoses prior to evaluation: The emergent differential diagnosis includes, but is not limited to,  ACS, AAS, PE, Mallory-Weiss, Boerhaave's, Pneumonia, acute bronchitis, asthma or COPD exacerbation, anxiety, MSK pain or traumatic injury to the chest, acid reflux versus other . This is not an exhaustive differential.   Past Medical History / Co-morbidities /  Social History:  hypertension, COPD, hyperlipidemia, acid reflux, peripheral arterial disease, lung cancer, previous stroke with dysarthria  Additional history: Chart reviewed. Pertinent results include: Extensively reviewed lab work, imaging from evaluation within the last 24 hours with notable CT showing increasing lung mass, but no evidence of PE or other acute intrathoracic abnormality.  Physical Exam: Physical exam performed. The pertinent findings include: Patient is well-appearing with stable vital signs, afebrile,-year-old normal oxygen saturation on room air.  Lab Tests/Imaging studies: I personally interpreted labs/imaging and the pertinent results include: CBC overall unremarkable, stable, hemoglobin 12.9.  BMP unremarkable.  Normal troponin x 2.  Plain film chest x-ray with similar appearance to recent previous. I agree with the radiologist interpretation.  Cardiac monitoring: EKG obtained and interpreted by my attending physician which shows: NSR   Medications: I ordered medication  including morphine for pain.  I have reviewed the patients home medicines and have made adjustments as needed.   Disposition: After consideration of the diagnostic results and the patients response to treatment, I feel that patient with unclear explanation (right chest pain, he is not in any respiratory distress, no evidence of ACS, CT showed increasing size of lung mass, patient with short prescription for pain medication being ordered) follow-up.Marland Kitchen   emergency department workup does not suggest an emergent condition requiring admission or immediate intervention beyond what has been performed at this time. The plan is: as above. The patient is safe for discharge and has been instructed to return immediately for worsening symptoms, change in symptoms or any other concerns.  Final Clinical Impression(s) / ED Diagnoses Final diagnoses:  Chest pain, unspecified type  Lung mass    Rx / DC Orders ED Discharge Orders          Ordered    oxyCODONE-acetaminophen (PERCOCET/ROXICET) 5-325 MG tablet  Every 6 hours PRN        06/03/23 1832              [place] 06/03/23 1853    Terald Sleeper, MD 06/03/23 [id number]

## 2023-06-03 NOTE — Discharge Instructions (Signed)
Please use Tylenol or ibuprofen for pain.  You may use 600 mg ibuprofen every 6 hours or 1000 mg of Tylenol every 6 hours.  You may choose to alternate between the 2.  This would be most effective.  Not to exceed 4 g of Tylenol within 24 hours.  Not to exceed 3200 mg ibuprofen 24 hours.  Please follow-up with your oncologist Dr. Shirline Frees

## 2023-06-03 NOTE — Discharge Instructions (Signed)
As we discussed, your CT today was concerning for mass in your right upper lung that appears to be getting bigger since last scan. I have messaged her oncologist to expedite follow-up, their office should be contacting you. Return here for any new or acute changes--shortness of breath, coughing up blood, severe pain, etc.

## 2023-06-03 NOTE — ED Triage Notes (Signed)
Rt sided chest pain continues, seen last night for same, has had stroke  which makes communication difficult

## 2023-06-09 ENCOUNTER — Telehealth: Payer: Self-pay | Admitting: Internal Medicine

## 2023-06-09 NOTE — Telephone Encounter (Signed)
Scheduled per 06/18 scheduling message, patient has been called and voicemail was left.

## 2023-06-11 ENCOUNTER — Other Ambulatory Visit: Payer: Self-pay

## 2023-06-11 ENCOUNTER — Emergency Department (HOSPITAL_COMMUNITY)
Admission: EM | Admit: 2023-06-11 | Discharge: 2023-06-11 | Disposition: A | Discharge status: Home / Self Care | Payer: Medicaid Other | Attending: Emergency Medicine | Admitting: Emergency Medicine

## 2023-06-11 ENCOUNTER — Encounter (HOSPITAL_COMMUNITY): Payer: Self-pay | Admitting: Emergency Medicine

## 2023-06-11 ENCOUNTER — Emergency Department (HOSPITAL_COMMUNITY): Payer: Medicaid Other

## 2023-06-11 DIAGNOSIS — Z7901 Long term (current) use of anticoagulants: Secondary | ICD-10-CM | POA: Insufficient documentation

## 2023-06-11 DIAGNOSIS — Z85118 Personal history of other malignant neoplasm of bronchus and lung: Secondary | ICD-10-CM | POA: Diagnosis not present

## 2023-06-11 DIAGNOSIS — J439 Emphysema, unspecified: Secondary | ICD-10-CM | POA: Insufficient documentation

## 2023-06-11 DIAGNOSIS — R4701 Aphasia: Secondary | ICD-10-CM | POA: Diagnosis not present

## 2023-06-11 DIAGNOSIS — Z7951 Long term (current) use of inhaled steroids: Secondary | ICD-10-CM | POA: Diagnosis not present

## 2023-06-11 DIAGNOSIS — Z87891 Personal history of nicotine dependence: Secondary | ICD-10-CM | POA: Diagnosis not present

## 2023-06-11 DIAGNOSIS — J45909 Unspecified asthma, uncomplicated: Secondary | ICD-10-CM | POA: Insufficient documentation

## 2023-06-11 DIAGNOSIS — R0789 Other chest pain: Secondary | ICD-10-CM | POA: Diagnosis present

## 2023-06-11 DIAGNOSIS — W010XXA Fall on same level from slipping, tripping and stumbling without subsequent striking against object, initial encounter: Secondary | ICD-10-CM | POA: Insufficient documentation

## 2023-06-11 DIAGNOSIS — Z7902 Long term (current) use of antithrombotics/antiplatelets: Secondary | ICD-10-CM | POA: Insufficient documentation

## 2023-06-11 DIAGNOSIS — J449 Chronic obstructive pulmonary disease, unspecified: Secondary | ICD-10-CM | POA: Diagnosis not present

## 2023-06-11 DIAGNOSIS — I10 Essential (primary) hypertension: Secondary | ICD-10-CM | POA: Insufficient documentation

## 2023-06-11 MED ORDER — OXYCODONE-ACETAMINOPHEN 5-325 MG PO TABS: 1.0000 | ORAL_TABLET | Freq: Once | ORAL | Status: DC

## 2023-06-11 NOTE — Discharge Instructions (Signed)
Please take 1000 mg of tylenol every 6 hours as needed for pain. Your x-ray didn't show any broken ribs or collapsed lung.

## 2023-06-11 NOTE — ED Provider Notes (Signed)
[place] EMERGENCY DEPARTMENT AT [facility] Provider Note  CSN: [id number] Arrival date & time: 06/11/23 1559  Chief Complaint(s) Fall  HPI Todd Estrada is a 62 y.o. male history of COPD, hypertension, hyperlipidemia, lung cancer, prior stroke, on Eliquis presenting to the emergency department with chest pain.  I spoke with the patient's caregiver Darnel Mchan who reports that the patient was trying to reach a fan, ended up pulling the fan into his chest and then was complaining of chest pain.  No head injury, loss of consciousness, difficulty breathing.  Patient is aphasic but able to respond to simple yes or no questions.  Patient says no when asked if he has any headache, difficulty breathing, abdominal pain, fevers or chills, pain in his legs, pain in his arms, back pain.  He says yes when I asked him about chest pain.   Past Medical History Past Medical History:  Diagnosis Date   Asthma    Atypical chest pain 08/13/2022   Community acquired pneumonia 09/14/2022   COPD (chronic obstructive pulmonary disease) (HCC)    Essential hypertension 08/19/2021   GERD (gastroesophageal reflux disease)    HAP (hospital-acquired pneumonia) 09/16/2022   History of tracheostomy    03/09/22-04/11/22   HLD (hyperlipidemia)    Hypertension    Hypokalemia 08/13/2022   Hypomagnesemia 08/13/2022   Lung cancer (HCC)    PAD (peripheral artery disease) (HCC)    Seizures (HCC) 06/02/2022   Sepsis (HCC) 08/13/2022   Sepsis due to pneumonia (HCC) 04/13/2023   Stroke (HCC) 02/2022   Patient Active Problem List   Diagnosis Date Noted   Right upper lobe pneumonia 04/13/2023   Buttock wound, right, subsequent encounter 03/01/2023   PAD (peripheral artery disease) (HCC) 01/06/2023   Chronic hypotension 01/06/2023   History of pulmonary embolism 10/12/2022   Cervical transverse process fracture (HCC) 09/10/2022   Frequent falls 08/27/2022   Aphasia as late effect of cerebrovascular  accident 08/16/2022   Anemia of chronic disease 08/13/2022   Chronic heart failure with mildly reduced ejection fraction (HFmrEF) (HCC) 08/13/2022   Necrotizing pneumonia (HCC) 08/12/2022   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 Authorizing Provider  albuterol (PROVENTIL) (2.5 MG/3ML) 0.083% nebulizer solution Take 3 mLs (2.5 mg total) by nebulization every 4 (four) hours as needed for wheezing or shortness of breath. Patient not taking: Reported on 04/13/2023 08/16/22   Steffanie Rainwater, MD  apixaban (ELIQUIS) 5 MG TABS tablet TAKE 1 TABLET(5 MG) BY MOUTH TWICE DAILY Patient taking differently: Take 5 mg by mouth 2 (two) times daily. 04/11/23   Merrilyn Puma, MD  atorvastatin (LIPITOR) 40 MG tablet Take 1 tablet (40 mg total) by mouth daily. 01/03/23 01/03/24  Merrilyn Puma, MD  budesonide (PULMICORT) 0.5 MG/2ML nebulizer solution NEW PRESCRIPTION REQEUST: BUDESONIDE 0.5 MG/ - USE ONE VIAL TWICE DAILY Patient taking differently: Take 0.5 mg by nebulization in the morning and at bedtime. 11/29/22   Martina Sinner, MD  clopidogrel (PLAVIX) 75 MG tablet Take 75 mg by mouth daily.    [provider]  folic acid (FOLVITE) 1 MG tablet Take 1 tablet (1 mg total) by mouth daily. Patient not taking: Reported on 06/03/2023 03/21/23   Merrilyn Puma, MD  levETIRAcetam (KEPPRA) 500 MG tablet Take 1 tablet (500 mg total) by mouth 2 (two) times  daily. 12/07/22   Ihor Austin, NP  midodrine (PROAMATINE) 2.5 MG tablet Take 1 tablet (2.5 mg total) by mouth 3 (three) times daily with meals. 01/06/23 07/05/23  Merrilyn Puma, MD  Multiple Vitamin (MULTIVITAMIN WITH MINERALS) TABS tablet Take 1 tablet by mouth daily. Patient taking differently: Take 1 tablet by mouth daily with breakfast. 09/20/22   Doran Stabler, DO  oxyCODONE-acetaminophen (PERCOCET/ROXICET) 5-325 MG tablet Take 1 tablet by mouth every 6 (six) hours as needed for severe pain. 06/03/23   Prosperi, Christian H, PA-C  Tiotropium Bromide-Olodaterol (STIOLTO RESPIMAT) 2.5-2.5 MCG/ACT AERS NEW PRESCRIPTION REQUEST: STIOLTO 2.5 MCG- INHALE TWO PUFFS BY MOUTH DAILY Patient taking differently: Inhale 2 each into the lungs daily. 11/29/22   Martina Sinner, MD                                                                                                                                    Past Surgical History Past Surgical History:  Procedure Laterality Date   BRONCHIAL BIOPSY  05/30/2022   Procedure: BRONCHIAL BIOPSIES;  Surgeon: Leslye Peer, MD;  Location: [facility] ENDOSCOPY;  Service: Pulmonary;;   BRONCHIAL BRUSHINGS  05/30/2022   Procedure: BRONCHIAL BRUSHINGS;  Surgeon: Leslye Peer, MD;  Location: [facility] ENDOSCOPY;  Service: Pulmonary;;   BRONCHIAL NEEDLE ASPIRATION BIOPSY  05/30/2022   Procedure: BRONCHIAL NEEDLE ASPIRATION BIOPSIES;  Surgeon: Leslye Peer, MD;  Location: [facility] ENDOSCOPY;  Service: Pulmonary;;   BRONCHIAL WASHINGS  05/30/2022   Procedure: BRONCHIAL WASHINGS;  Surgeon: Leslye Peer, MD;  Location: [facility] ENDOSCOPY;  Service: Pulmonary;;   ESOPHAGOGASTRODUODENOSCOPY (EGD) WITH PROPOFOL N/A 03/11/2022   Procedure: ESOPHAGOGASTRODUODENOSCOPY (EGD) WITH PROPOFOL;  Surgeon: Violeta Gelinas, MD;  Location: [facility] ENDOSCOPY;  Service: General;  Laterality: N/A;   FIDUCIAL MARKER PLACEMENT  05/30/2022   Procedure: FIDUCIAL MARKER PLACEMENT;  Surgeon: Leslye Peer, MD;   Location: [facility] ENDOSCOPY;  Service: Pulmonary;;   IR ANGIO INTRA EXTRACRAN SEL COM CAROTID INNOMINATE UNI R MOD SED  03/02/2022   IR CT HEAD LTD  03/02/2022   IR PERCUTANEOUS ART THROMBECTOMY/INFUSION INTRACRANIAL INC DIAG ANGIO  03/02/2022   PEG PLACEMENT N/A 03/11/2022   Procedure: PERCUTANEOUS ENDOSCOPIC GASTROSTOMY (PEG) PLACEMENT;  Surgeon: Violeta Gelinas, MD;  Location: [facility] ENDOSCOPY;  Service: General;  Laterality: N/A;   RADIOLOGY WITH ANESTHESIA N/A 03/02/2022   Procedure: IR WITH ANESTHESIA;  Surgeon: Julieanne Cotton, MD;  Location: [facility] OR;  Service: Radiology;  Laterality: N/A;   VIDEO BRONCHOSCOPY WITH RADIAL ENDOBRONCHIAL ULTRASOUND  05/30/2022   Procedure: VIDEO BRONCHOSCOPY WITH RADIAL ENDOBRONCHIAL  ULTRASOUND;  Surgeon: Leslye Peer, MD;  Location: [facility] ENDOSCOPY;  Service: Pulmonary;;   Family History Family History  Problem Relation Age of Onset   Throat cancer Mother    Liver cancer Father    Kidney failure Sister    Cancer - Lung Paternal Uncle     Social History Social History   Tobacco Use   Smoking status: Former    Types: Cigarettes    Quit date: 05/02/2022    Years since quitting: 1.1    Passive exposure: Never   Smokeless tobacco: Never  Vaping Use   Vaping Use: Never used  Substance Use Topics   Alcohol use: Not Currently   Drug use: No   Allergies Patient has no known allergies.  Review of Systems Review of Systems  All other systems reviewed and are negative.   Physical Exam Vital Signs  I have reviewed the triage vital signs BP 136/81 (BP Location: Right Arm)   Pulse 79   Temp 98.8 F (37.1 C) (Oral)   Resp 16   Ht 6' (1.829 m)   Wt 78 kg   SpO2 96%   BMI 23.32 kg/m  Physical Exam Vitals and nursing note reviewed.  Constitutional:      General: He is not in acute distress.    Appearance: Normal appearance.  HENT:     Mouth/Throat:     Mouth: Mucous membranes are moist.  Eyes:     Conjunctiva/sclera: Conjunctivae normal.   Cardiovascular:     Rate and Rhythm: Normal rate and regular rhythm.  Pulmonary:     Effort: Pulmonary effort is normal. No respiratory distress.     Breath sounds: Normal breath sounds.  Abdominal:     General: Abdomen is flat.     Palpations: Abdomen is soft.     Tenderness: There is no abdominal tenderness.  Musculoskeletal:     Right lower leg: No edema.     Left lower leg: No edema.     Comments: Mild right-sided chest wall tenderness to palpation with no bruising, deformity, crepitus  Skin:    General: Skin is warm and dry.     Capillary Refill: Capillary refill takes less than 2 seconds.  Neurological:     Mental Status: He is alert. Mental status is at baseline.     Comments: Chronic aphasia, moves all 4 extremities spontaneously, able to answer simple questions with yes or no  Psychiatric:        Mood and Affect: Mood normal.        Behavior: Behavior normal.     ED Results and Treatments Labs (all labs ordered are listed, but only abnormal results are displayed) Labs Reviewed - No data to display                                                                                                                        Radiology DG Ribs Unilateral W/Chest Right  Result Date: 06/11/2023 CLINICAL DATA:  Fall onto rib, pain EXAM: RIGHT RIBS AND CHEST - 3+ VIEW COMPARISON:  06/03/2023 FINDINGS: No displaced fracture or other bone lesions are seen involving the ribs. There is no evidence of pneumothorax or pleural effusion. Unchanged dense, bandlike consolidation of the peripheral right midlung with internal biopsy marking clip. Additional, less dense bandlike fibrotic change of the left midlung. No new airspace opacity. Heart size and mediastinal contours are within normal limits. IMPRESSION: 1. No displaced fracture or radiographic abnormality of the ribs to explain pain. 2. Unchanged dense, bandlike consolidation of the peripheral right midlung with internal biopsy marking clip.  Additional, less dense bandlike fibrotic change of the left midlung. No new airspace opacity. Electronically Signed   By: Jearld Lesch M.D.   On: 06/11/2023 17:01    Pertinent labs & imaging results that were available during my care of the patient were reviewed by me and considered in my medical decision making (see MDM for details).  Medications Ordered in ED Medications  oxyCODONE-acetaminophen (PERCOCET/ROXICET) 5-325 MG per tablet 1 tablet (has no administration in time range)                                                                                                                                     Procedures Procedures  (including critical care time)  Medical Decision Making / ED Course   MDM:  62 year old male presenting to the emergency department with chest pain after pulling a fan into his chest.  He has mild tenderness on exam but no crepitus.  His vitals are reassuring.  Lungs clear.  Chest x-ray with rib series obtained with no evidence of rib fracture, pneumothorax.  Discussed with his caregiver over the phone who confirms no additional history and that the patient simply grabbed a fan and hit him in the chest.  Will discharge.  Discussed with caregiver and patient.  Discussed return precautions with caregiver and patient.      Additional history obtained: -Additional history obtained from caregiver -External records from outside source obtained and reviewed including: Chart review including previous notes, labs, imaging, consultation notes including prior ER visits  Imaging Studies ordered: I ordered imaging studies including CXR w rib series On my interpretation imaging demonstrates no acute process I independently visualized and interpreted imaging. I agree with the radiologist interpretation   Medicines ordered and prescription drug management: Meds ordered this encounter  Medications   oxyCODONE-acetaminophen (PERCOCET/ROXICET) 5-325 MG per tablet 1  tablet    -I have reviewed the patients home medicines and have made adjustments as needed   Social Determinants of Health:  Diagnosis or treatment significantly limited by social determinants of health: former smoker   Reevaluation: After the interventions noted above, I reevaluated the patient and found that their symptoms have improved  Co morbidities that complicate the patient evaluation  Past Medical History:  Diagnosis Date   Asthma    Atypical chest pain 08/13/2022  Community acquired pneumonia 09/14/2022   COPD (chronic obstructive pulmonary disease) (HCC)    Essential hypertension 08/19/2021   GERD (gastroesophageal reflux disease)    HAP (hospital-acquired pneumonia) 09/16/2022   History of tracheostomy    03/09/22-04/11/22   HLD (hyperlipidemia)    Hypertension    Hypokalemia 08/13/2022   Hypomagnesemia 08/13/2022   Lung cancer (HCC)    PAD (peripheral artery disease) (HCC)    Seizures (HCC) 06/02/2022   Sepsis (HCC) 08/13/2022   Sepsis due to pneumonia (HCC) 04/13/2023   Stroke (HCC) 02/2022      Dispostion: Disposition decision including need for hospitalization was considered, and patient discharged from emergency department.    Final Clinical Impression(s) / ED Diagnoses Final diagnoses:  Chest wall pain     This chart was dictated using voice recognition software.  Despite best efforts to proofread,  errors can occur which can change the documentation meaning.    Lonell Grandchild, MD 06/11/23 [phone]

## 2023-06-11 NOTE — ED Triage Notes (Signed)
Pt with caregiver who reports he stumbled and pulled a fan on to his chest so pt is having right sided chest pain. Pt nonverbal from previous stroke. Denies hitting head, landed on bed.

## 2023-06-19 ENCOUNTER — Other Ambulatory Visit: Payer: Self-pay

## 2023-06-19 ENCOUNTER — Inpatient Hospital Stay: Payer: Medicaid Other | Attending: Internal Medicine | Admitting: Internal Medicine

## 2023-06-19 VITALS — BP 108/67 | HR 71 | Temp 97.9°F | Resp 17 | Wt 176.9 lb

## 2023-06-19 DIAGNOSIS — Z9221 Personal history of antineoplastic chemotherapy: Secondary | ICD-10-CM | POA: Insufficient documentation

## 2023-06-19 DIAGNOSIS — C3490 Malignant neoplasm of unspecified part of unspecified bronchus or lung: Secondary | ICD-10-CM

## 2023-06-19 DIAGNOSIS — Z87891 Personal history of nicotine dependence: Secondary | ICD-10-CM | POA: Diagnosis not present

## 2023-06-19 DIAGNOSIS — R059 Cough, unspecified: Secondary | ICD-10-CM | POA: Diagnosis not present

## 2023-06-19 DIAGNOSIS — Z79899 Other long term (current) drug therapy: Secondary | ICD-10-CM | POA: Diagnosis not present

## 2023-06-19 DIAGNOSIS — C3411 Malignant neoplasm of upper lobe, right bronchus or lung: Secondary | ICD-10-CM | POA: Insufficient documentation

## 2023-06-19 DIAGNOSIS — R079 Chest pain, unspecified: Secondary | ICD-10-CM | POA: Insufficient documentation

## 2023-06-19 DIAGNOSIS — C3412 Malignant neoplasm of upper lobe, left bronchus or lung: Secondary | ICD-10-CM | POA: Insufficient documentation

## 2023-06-19 DIAGNOSIS — R918 Other nonspecific abnormal finding of lung field: Secondary | ICD-10-CM | POA: Diagnosis not present

## 2023-06-19 DIAGNOSIS — R0602 Shortness of breath: Secondary | ICD-10-CM | POA: Diagnosis not present

## 2023-06-19 DIAGNOSIS — Z923 Personal history of irradiation: Secondary | ICD-10-CM | POA: Insufficient documentation

## 2023-06-19 NOTE — Progress Notes (Signed)
[facility] [phone]   Fax:([phone]) [phone]  OFFICE PROGRESS NOTE  Merrilyn Puma, MD [address] [place] [address]  DIAGNOSIS:  Stage IIb (T2 a, N1, M0) non-small cell lung cancer, squamous cell carcinoma presented with left upper lobe lung mass in addition to left hilar adenopathy diagnosed in June 2023.  The patient also has synchronous stage Ia (T1c, N0, M0) non-small cell lung cancer, squamous cell carcinoma presented with right upper lobe lung nodule diagnosed in June 2023. PD-L1 expression was 20%.  PRIOR THERAPY: Concurrent chemoradiation with weekly carboplatin for AUC of 2 and paclitaxel 45 Mg/M2.  First dose 07/04/2022.  Status post post 5 cycles.  Last cycle was given August 01, 2022 with partial response.  The patient was lost to follow-up after that cycle.  CURRENT THERAPY: Observation.  INTERVAL HISTORY: Todd Estrada 62 y.o. male returns to the clinic today for follow-up visit accompanied by his nephew.  The patient is feeling fine today with no concerning complaints except for intermittent pain on the right side of the chest as well as a baseline shortness of breath and cough.  He denied having any current nausea, vomiting, diarrhea or constipation.  He has no headache or visual changes.  He has no fever or chills.  He has no recent weight loss or night sweats.  He is here today for evaluation with repeat CT scan of the chest for restaging of his disease.   MEDICAL HISTORY: Past Medical History:  Diagnosis Date   Asthma    Atypical chest pain 08/13/2022   Community acquired pneumonia 09/14/2022   COPD (chronic obstructive pulmonary disease) (HCC)    Essential hypertension 08/19/2021   GERD (gastroesophageal reflux disease)    HAP (hospital-acquired pneumonia) 09/16/2022   History of tracheostomy    03/09/22-04/11/22   HLD (hyperlipidemia)    Hypertension    Hypokalemia 08/13/2022   Hypomagnesemia 08/13/2022   Lung cancer (HCC)     PAD (peripheral artery disease) (HCC)    Seizures (HCC) 06/02/2022   Sepsis (HCC) 08/13/2022   Stroke (HCC) 02/2022    ALLERGIES:  has No Known Allergies.  MEDICATIONS:  Current Outpatient Medications  Medication Sig Dispense Refill   albuterol (PROVENTIL) (2.5 MG/3ML) 0.083% nebulizer solution Take 3 mLs (2.5 mg total) by nebulization every 4 (four) hours as needed for wheezing or shortness of breath. (Patient not taking: Reported on 04/13/2023) 90 mL 12   apixaban (ELIQUIS) 5 MG TABS tablet TAKE 1 TABLET(5 MG) BY MOUTH TWICE DAILY (Patient taking differently: Take 5 mg by mouth 2 (two) times daily.) 180 tablet 2   atorvastatin (LIPITOR) 40 MG tablet Take 1 tablet (40 mg total) by mouth daily. 90 tablet 3   budesonide (PULMICORT) 0.5 MG/2ML nebulizer solution NEW PRESCRIPTION REQEUST: BUDESONIDE 0.5 MG/ - USE ONE VIAL TWICE DAILY (Patient taking differently: Take 0.5 mg by nebulization in the morning and at bedtime.) 180 mL 3   clopidogrel (PLAVIX) 75 MG tablet Take 75 mg by mouth daily.     folic acid (FOLVITE) 1 MG tablet Take 1 tablet (1 mg total) by mouth daily. 30 tablet 2   levETIRAcetam (KEPPRA) 500 MG tablet Take 1 tablet (500 mg total) by mouth 2 (two) times daily. 180 tablet 3   midodrine (PROAMATINE) 2.5 MG tablet Take 1 tablet (2.5 mg total) by mouth 3 (three) times daily with meals. 90 tablet 5   Multiple Vitamin (MULTIVITAMIN WITH MINERALS) TABS tablet Take 1  tablet by mouth daily. (Patient taking differently: Take 1 tablet by mouth daily with breakfast.) 30 tablet 2   Tiotropium Bromide-Olodaterol (STIOLTO RESPIMAT) 2.5-2.5 MCG/ACT AERS NEW PRESCRIPTION REQUEST: STIOLTO 2.5 MCG- INHALE TWO PUFFS BY MOUTH DAILY (Patient taking differently: Inhale 2 each into the lungs daily.) 12 g 3   No current facility-administered medications for this visit.    SURGICAL HISTORY:  Past Surgical History:  Procedure Laterality Date   BRONCHIAL BIOPSY  05/30/2022   Procedure: BRONCHIAL  BIOPSIES;  Surgeon: Leslye Peer, MD;  Location: [facility] ENDOSCOPY;  Service: Pulmonary;;   BRONCHIAL BRUSHINGS  05/30/2022   Procedure: BRONCHIAL BRUSHINGS;  Surgeon: Leslye Peer, MD;  Location: [facility] ENDOSCOPY;  Service: Pulmonary;;   BRONCHIAL NEEDLE ASPIRATION BIOPSY  05/30/2022   Procedure: BRONCHIAL NEEDLE ASPIRATION BIOPSIES;  Surgeon: Leslye Peer, MD;  Location: [facility] ENDOSCOPY;  Service: Pulmonary;;   BRONCHIAL WASHINGS  05/30/2022   Procedure: BRONCHIAL WASHINGS;  Surgeon: Leslye Peer, MD;  Location: [facility] ENDOSCOPY;  Service: Pulmonary;;   ESOPHAGOGASTRODUODENOSCOPY (EGD) WITH PROPOFOL N/A 03/11/2022   Procedure: ESOPHAGOGASTRODUODENOSCOPY (EGD) WITH PROPOFOL;  Surgeon: Violeta Gelinas, MD;  Location: [facility] ENDOSCOPY;  Service: General;  Laterality: N/A;   FIDUCIAL MARKER PLACEMENT  05/30/2022   Procedure: FIDUCIAL MARKER PLACEMENT;  Surgeon: Leslye Peer, MD;  Location: [facility] ENDOSCOPY;  Service: Pulmonary;;   IR ANGIO INTRA EXTRACRAN SEL COM CAROTID INNOMINATE UNI R MOD SED  03/02/2022   IR CT HEAD LTD  03/02/2022   IR PERCUTANEOUS ART THROMBECTOMY/INFUSION INTRACRANIAL INC DIAG ANGIO  03/02/2022   PEG PLACEMENT N/A 03/11/2022   Procedure: PERCUTANEOUS ENDOSCOPIC GASTROSTOMY (PEG) PLACEMENT;  Surgeon: Violeta Gelinas, MD;  Location: [facility] ENDOSCOPY;  Service: General;  Laterality: N/A;   RADIOLOGY WITH ANESTHESIA N/A 03/02/2022   Procedure: IR WITH ANESTHESIA;  Surgeon: Julieanne Cotton, MD;  Location: [facility] OR;  Service: Radiology;  Laterality: N/A;   VIDEO BRONCHOSCOPY WITH RADIAL ENDOBRONCHIAL ULTRASOUND  05/30/2022   Procedure: VIDEO BRONCHOSCOPY WITH RADIAL ENDOBRONCHIAL ULTRASOUND;  Surgeon: Leslye Peer, MD;  Location: [facility] ENDOSCOPY;  Service: Pulmonary;;    REVIEW OF SYSTEMS:  Constitutional: positive for fatigue Eyes: negative Ears, nose, mouth, throat, and face: negative Respiratory: positive for cough, dyspnea on exertion, and pleurisy/chest pain Cardiovascular:  negative Gastrointestinal: negative Genitourinary:negative Integument/breast: negative Hematologic/lymphatic: negative Musculoskeletal:positive for muscle weakness Neurological: negative Behavioral/Psych: negative Endocrine: negative Allergic/Immunologic: negative   PHYSICAL EXAMINATION: General appearance: alert, cooperative, fatigued, and no distress Head: Normocephalic, without obvious abnormality, atraumatic Neck: no adenopathy, no JVD, supple, symmetrical, trachea midline, and thyroid not enlarged, symmetric, no tenderness/mass/nodules Lymph nodes: Cervical, supraclavicular, and axillary nodes normal. Resp: clear to auscultation bilaterally Back: symmetric, no curvature. ROM normal. No CVA tenderness. Cardio: regular rate and rhythm, S1, S2 normal, no murmur, click, rub or gallop GI: soft, non-tender; bowel sounds normal; no masses,  no organomegaly Extremities: extremities normal, atraumatic, no cyanosis or edema Neurologic: Alert and oriented X 3, normal strength and tone. Normal symmetric reflexes. Normal coordination and gait  ECOG PERFORMANCE STATUS: 1 - Symptomatic but completely ambulatory  Blood pressure 96/65, pulse 80, temperature (!) 97.2 F (36.2 C), temperature source Temporal, resp. rate 18, weight 171 lb 3.2 oz (77.7 kg), SpO2 100 %.  LABORATORY DATA: Lab Results  Component Value Date   WBC 6.1 05/04/2023   HGB 11.9 (L) 05/04/2023   HCT 38.4 (L) 05/04/2023   MCV 94.6 05/04/2023   PLT 158 05/04/2023      Chemistry      Component  Value Date/Time   NA 139 04/25/2023 1811   NA 140 09/09/2022 0908   K 3.9 04/25/2023 1811   CL 105 04/25/2023 1811   CO2 27 04/25/2023 1811   BUN 17 04/25/2023 1811   BUN 16 09/09/2022 0908   CREATININE 0.67 04/25/2023 1811   CREATININE 0.78 08/08/2022 1028      Component Value Date/Time   CALCIUM 8.8 (L) 04/25/2023 1811   ALKPHOS 81 04/13/2023 0019   AST 16 04/13/2023 0019   AST 15 08/08/2022 1028   ALT 11 04/13/2023  0019   ALT 15 08/08/2022 1028   BILITOT 0.5 04/13/2023 0019   BILITOT 1.3 (H) 08/08/2022 1028       RADIOGRAPHIC STUDIES: DG Chest 2 View  Result Date: 04/25/2023 CLINICAL DATA:  Chest pain.  Lung carcinoma. EXAM: CHEST - 2 VIEW COMPARISON:  04/13/2023 and chest CTA on 04/08/2023 FINDINGS: The heart size and mediastinal contours are within normal limits. Coarse opacities in both mid lungs are again seen, which felt to represent scarring on recent chest CTA. No evidence of acute infiltrate or edema. No evidence of pleural effusion. IMPRESSION: Stable bilateral mid lung opacities, felt to represent scarring on recent chest CTA. No acute findings. Electronically Signed   By: Danae Orleans M.D.   On: 04/25/2023 18:57   CT HEAD WO CONTRAST ( )  Result Date: 04/14/2023 CLINICAL DATA:  Fall EXAM: CT HEAD WITHOUT CONTRAST TECHNIQUE: Contiguous axial images were obtained from the base of the skull through the vertex without intravenous contrast. RADIATION DOSE REDUCTION: This exam was performed according to the departmental dose-optimization program which includes automated exposure control, adjustment of the mA and/or kV according to patient size and/or use of iterative reconstruction technique. COMPARISON:  CT head 04/13/23 FINDINGS: Brain: Redemonstrated is a large left MCA territory infarct with associated cortical laminar necrosis, unchanged from prior exam. There is also a chronic left cerebellar infarct. No hydrocephalus. No extra-axial fluid collection. No new hemorrhage. Vascular: No hyperdense vessel or unexpected calcification. Skull: Mild soft tissue thickening along the midline parietal scalp. No evidence of underlying calvarial fracture. Sinuses/Orbits: No middle ear effusion. Trace left mastoid effusion. Complete opacification of the right maxillary sinus. Mild mucosal thickening in the left maxillary sinus. Mild mucosal thickening in the right-sided ethmoid air cells. These findings are  unchanged from recent prior CT head. Other: None. IMPRESSION: 1. No acute intracranial abnormality. 2. Mild soft tissue thickening along the midline parietal scalp. No evidence of underlying calvarial fracture. Electronically Signed   By: Lorenza Cambridge M.D.   On: 04/14/2023 15:26   CT Head Wo Contrast  Result Date: 04/13/2023 CLINICAL DATA:  Mental status change, unknown cause. EXAM: CT HEAD WITHOUT CONTRAST TECHNIQUE: Contiguous axial images were obtained from the base of the skull through the vertex without intravenous contrast. RADIATION DOSE REDUCTION: This exam was performed according to the departmental dose-optimization program which includes automated exposure control, adjustment of the mA and/or kV according to patient size and/or use of iterative reconstruction technique. COMPARISON:  03/09/2023. FINDINGS: Brain: No acute hemorrhage, midline shift or mass effect. No extra-axial fluid collection. Diffuse atrophy is noted. There is encephalomalacia in the left cerebral hemisphere and left cerebral hemisphere. Periventricular white matter hypodensities are present bilaterally. No hydrocephalus. Vascular: No hyperdense vessel or unexpected calcification. Skull: Normal. Negative for fracture or focal lesion. Sinuses/Orbits: There is partial opacification of the ethmoid air cells and right maxillary sinus. No acute orbital abnormality. Other: None. IMPRESSION: Stable head CT with no acute  intracranial process. Electronically Signed   By: Thornell Sartorius M.D.   On: 04/13/2023 01:32   DG Chest 2 View  Result Date: 04/13/2023 CLINICAL DATA:  History of lung cancer presenting with chest pain. EXAM: CHEST - 2 VIEW COMPARISON:  April 08, 2023 FINDINGS: The heart size and mediastinal contours are within normal limits. There is marked severity emphysematous lung disease. A stable area of coarse parenchymal opacification is seen within the left perihilar region. Moderate to marked severity focal opacification is  also seen within the mid right lung. This is increased in size when compared to the prior study. A small radiopaque surgical clip is also seen within the mid right lung. There is no evidence of a pleural effusion or pneumothorax. The visualized skeletal structures are unremarkable. IMPRESSION: 1. Marked severity emphysematous lung disease with stable left perihilar coarse scarring and/or atelectasis. 2. Additional areas of scarring and atelectasis within the right upper lobe, with interval development of superimposed moderate severity infiltrate when compared to the prior exam. Electronically Signed   By: Aram Candela M.D.   On: 04/13/2023 00:28   CT ABDOMEN PELVIS W CONTRAST  Result Date: 04/08/2023 CLINICAL DATA:  Left lower quadrant abdominal pain EXAM: CT ABDOMEN AND PELVIS WITH CONTRAST TECHNIQUE: Multidetector CT imaging of the abdomen and pelvis was performed using the standard protocol following bolus administration of intravenous contrast. RADIATION DOSE REDUCTION: This exam was performed according to the departmental dose-optimization program which includes automated exposure control, adjustment of the mA and/or kV according to patient size and/or use of iterative reconstruction technique. CONTRAST:  OMNIPAQUE IOHEXOL 350 MG/ML SOLN COMPARISON:  12/02/2022 FINDINGS: Lower chest: Areas of scarring in the lung bases. Mild elevation of the left hemidiaphragm. No acute findings. Hepatobiliary: No focal hepatic abnormality. Gallbladder unremarkable. Pancreas: No focal abnormality or ductal dilatation. Spleen: No focal abnormality.  Normal size. Adrenals/Urinary Tract: No adrenal abnormality. No focal renal abnormality. No stones or hydronephrosis. Urinary bladder is unremarkable. Stomach/Bowel: Stomach, large and small bowel grossly unremarkable. Vascular/Lymphatic: Aortic atherosclerosis. No evidence of aneurysm or adenopathy. Reproductive: No visible focal abnormality. Other: No free fluid or  free air. Musculoskeletal: No acute bony abnormality. IMPRESSION: No acute findings in the abdomen or pelvis. Aortic atherosclerosis. Electronically Signed   By: Charlett Nose M.D.   On: 04/08/2023 17:12   CT Angio Chest PE W/Cm &/Or Wo Cm  Result Date: 04/08/2023 CLINICAL DATA:  Pulmonary embolism (PE) suspected, high prob. Chest pain, shortness of breath EXAM: CT ANGIOGRAPHY CHEST WITH CONTRAST TECHNIQUE: Multidetector CT imaging of the chest was performed using the standard protocol during bolus administration of intravenous contrast. Multiplanar CT image reconstructions and MIPs were obtained to evaluate the vascular anatomy. RADIATION DOSE REDUCTION: This exam was performed according to the departmental dose-optimization program which includes automated exposure control, adjustment of the mA and/or kV according to patient size and/or use of iterative reconstruction technique. CONTRAST:  OMNIPAQUE IOHEXOL 350 MG/ML SOLN COMPARISON:  03/22/2023 FINDINGS: Cardiovascular: Heart is normal size. Aorta normal caliber. Moderate coronary artery and aortic calcifications. No filling defects in the pulmonary arteries to suggest pulmonary emboli. Mediastinum/Nodes: No mediastinal, hilar, or axillary adenopathy. Trachea and esophagus are unremarkable. Thyroid unremarkable. Lungs/Pleura: Moderate centrilobular emphysema. Areas of scarring in the mid lungs bilaterally, no new confluent airspace opacities. No effusions. Upper Abdomen: No acute findings Musculoskeletal: Chest wall soft tissues are unremarkable. No acute bony abnormality. Review of the MIP images confirms the above findings. IMPRESSION: No evidence of pulmonary embolus. Moderate  coronary artery disease. Areas of scarring in the mid lungs bilaterally. No acute cardiopulmonary disease. Aortic Atherosclerosis (ICD10-I70.0) and Emphysema (ICD10-J43.9). Electronically Signed   By: Charlett Nose M.D.   On: 04/08/2023 17:09   DG Chest Port 1 View  Result  Date: 04/08/2023 CLINICAL DATA:  Chest pain and shortness of breath. COPD. Lung carcinoma. EXAM: PORTABLE CHEST 1 VIEW COMPARISON:  03/30/2023 FINDINGS: The heart size and mediastinal contours are within normal limits. Coarse opacity is seen in the left perihilar region and lateral right midlung are unchanged in appearance, and consistent with chronic scarring/radiation changes. No evidence of acute infiltrate or pleural effusion. Pulmonary hyperinflation is again seen, consistent with COPD. IMPRESSION: No acute findings. Stable chronic scarring/radiation changes in left perihilar region and lateral right midlung. COPD. Electronically Signed   By: Danae Orleans M.D.   On: 04/08/2023 11:31    ASSESSMENT AND PLAN: This is a very pleasant 62 years old white male with  stage IIb (T2 a, N1, M0) non-small cell lung cancer, squamous cell carcinoma presented with left upper lobe lung mass in addition to left hilar adenopathy diagnosed in June 2023.  The patient also has synchronous stage Ia (T1c, N0, M0) non-small cell lung cancer, squamous cell carcinoma presented with right upper lobe lung nodule diagnosed in June 2023. PD-L1 expression was 20%. The patient underwent a course of concurrent chemoradiation with weekly carboplatin for AUC of 2 and paclitaxel 45 Mg/M2 status post 5 cycles.  Last dose was giving August 01 2022. He has been tolerating this treatment well with no concerning adverse effects. The patient is here today for evaluation with repeat CT scan of the chest performed during his hospitalization. I personally and independently reviewed the scan and discussed the result with the patient and his nephew today. Unfortunately his scan showed progressive disease with pleural-based right lung mass and questionable chest wall invasion. I recommended for the patient to see radiation oncology for evaluation and recommendation of palliative radiotherapy to this area. I will see him back for follow-up visit in 3  months for evaluation and repeat CT scan of the chest. The patient was advised to call immediately if he has any other concerning symptoms in the interval. The patient voices understanding of current disease status and treatment options and is in agreement with the current care plan.  All questions were answered. The patient knows to call the clinic with any problems, questions or concerns. We can certainly see the patient much sooner if necessary.  The total time spent in the appointment was 30 minutes.  Disclaimer: This note was dictated with voice recognition software. Similar sounding words can inadvertently be transcribed and may not be corrected upon review.

## 2023-06-26 NOTE — Progress Notes (Signed)
Thoracic Location of Tumor / Histology:  Right Upper Lobe Lung  Patient presented for restaging scans.  CTA 06/03/2023: Notably, however, in the periphery of the right upper lobe there is an increasingly aggressive appearing mass-like area which currently measures 7.2 x 2.9 cm, concerning for progressive neoplasm. Tissue sampling or further evaluation with non-emergent PET-CT should be considered to exclude malignancy.   Biopsies of   Tobacco/Marijuana/Snuff/ETOH use: Former smoker, quit 2023.  Past/Anticipated interventions by cardiothoracic surgery, if any:    Past/Anticipated interventions by medical oncology, if any:  Dr. Arbutus Ped 06/19/2023 -Unfortunately his scan showed progressive disease with pleural-based right lung mass and questionable chest wall invasion. -I recommended for the patient to see radiation oncology for evaluation and recommendation of palliative radiotherapy to this area. -I will see him back for follow-up visit in 3 months for evaluation and repeat CT scan of the chest.   Signs/Symptoms Weight changes, if any: Family reports he has gained a few pounds. Respiratory complaints, if any: Denies new SOB.  He has baseline difficulty.  Hemoptysis, if any: He reports occasional productive cough. Pain issues, if any:  Family reports right chest pain.  He has been evaluated in the hospital several times.  He is taking pain medications as needed.  SAFETY ISSUES: Prior radiation? Left Upper Lobe Lung 07/2022. Pacemaker/ICD? No. Possible current pregnancy? N/a Is the patient on methotrexate? No  Current Complaints / other details:

## 2023-06-27 ENCOUNTER — Ambulatory Visit
Admission: RE | Admit: 2023-06-27 | Discharge: 2023-06-27 | Disposition: A | Discharge status: Home / Self Care | Payer: Medicaid Other | Source: Ambulatory Visit | Attending: Radiation Oncology | Admitting: Radiation Oncology

## 2023-06-27 ENCOUNTER — Encounter: Payer: Self-pay | Admitting: Radiation Oncology

## 2023-06-27 ENCOUNTER — Other Ambulatory Visit: Payer: Self-pay

## 2023-06-27 ENCOUNTER — Ambulatory Visit: Payer: Medicaid Other | Admitting: Radiation Oncology

## 2023-06-27 VITALS — BP 126/78 | HR 62 | Temp 97.2°F | Resp 20 | Ht 72.0 in

## 2023-06-27 DIAGNOSIS — C3412 Malignant neoplasm of upper lobe, left bronchus or lung: Secondary | ICD-10-CM | POA: Insufficient documentation

## 2023-06-27 DIAGNOSIS — Z7901 Long term (current) use of anticoagulants: Secondary | ICD-10-CM | POA: Insufficient documentation

## 2023-06-27 DIAGNOSIS — C3411 Malignant neoplasm of upper lobe, right bronchus or lung: Secondary | ICD-10-CM | POA: Insufficient documentation

## 2023-06-27 DIAGNOSIS — Z7951 Long term (current) use of inhaled steroids: Secondary | ICD-10-CM | POA: Insufficient documentation

## 2023-06-27 DIAGNOSIS — Z9221 Personal history of antineoplastic chemotherapy: Secondary | ICD-10-CM | POA: Diagnosis not present

## 2023-06-27 DIAGNOSIS — Z79899 Other long term (current) drug therapy: Secondary | ICD-10-CM | POA: Diagnosis not present

## 2023-06-27 DIAGNOSIS — I700 Atherosclerosis of aorta: Secondary | ICD-10-CM | POA: Diagnosis not present

## 2023-06-27 DIAGNOSIS — J432 Centrilobular emphysema: Secondary | ICD-10-CM | POA: Diagnosis not present

## 2023-06-27 DIAGNOSIS — M47814 Spondylosis without myelopathy or radiculopathy, thoracic region: Secondary | ICD-10-CM | POA: Diagnosis not present

## 2023-06-27 DIAGNOSIS — R918 Other nonspecific abnormal finding of lung field: Secondary | ICD-10-CM | POA: Insufficient documentation

## 2023-06-27 DIAGNOSIS — Z923 Personal history of irradiation: Secondary | ICD-10-CM | POA: Insufficient documentation

## 2023-06-27 NOTE — Progress Notes (Signed)
Radiation Oncology         [phone]) [phone] ________________________________  Name: Todd Estrada MRN: [id number]  Date: 06/27/2023  DOB: 02-Aug-1961  Follow-Up Visit Note  CC: [name], MD  Si Gaul, MD  Diagnosis:   Stage III non-small cell lung cancer of the left lung, stage I non-small cell lung cancer of the right lung  Interval Since Last Radiation: The patient completed chemoradiation treatment in August 2023  Narrative:  The patient returns today for routine follow-up.  The patient indicates that he has been doing reasonably well recently.  No significant changes in shortness of breath.  However, he has been complaining of some discomfort in the right chest.  Recent CT imaging has been performed and this shows some postinfectious or inflammatory scarring in the lungs bilaterally.  Most notably, a 7.2 x 2.9 cm area has developed in a masslike fashion within the right lung overlapping with the prior site of SBRT to the right lung.  The patient is scheduled for ongoing follow-up CT imaging with medical oncology and I been asked to see the patient for consideration of further radiation treatment.                            ALLERGIES:  has No Known Allergies.  Meds: Current Outpatient Medications  Medication Sig Dispense Refill   apixaban (ELIQUIS) 5 MG TABS tablet TAKE 1 TABLET(5 MG) BY MOUTH TWICE DAILY (Patient taking differently: Take 5 mg by mouth 2 (two) times daily.) 180 tablet 2   atorvastatin (LIPITOR) 40 MG tablet Take 1 tablet (40 mg total) by mouth daily. 90 tablet 3   budesonide (PULMICORT) 0.5 MG/2ML nebulizer solution NEW PRESCRIPTION REQEUST: BUDESONIDE 0.5 MG/ - USE ONE VIAL TWICE DAILY (Patient taking differently: Take 0.5 mg by nebulization in the morning and at bedtime.) 180 mL 3   levETIRAcetam (KEPPRA) 500 MG tablet Take 1 tablet (500 mg total) by mouth 2 (two) times daily. 180 tablet 3   midodrine (PROAMATINE) 2.5 MG tablet Take 1 tablet (2.5 mg total)  by mouth 3 (three) times daily with meals. 90 tablet 5   Multiple Vitamin (MULTIVITAMIN WITH MINERALS) TABS tablet Take 1 tablet by mouth daily. (Patient taking differently: Take 1 tablet by mouth daily with breakfast.) 30 tablet 2   oxyCODONE-acetaminophen (PERCOCET/ROXICET) 5-325 MG tablet Take 1 tablet by mouth every 6 (six) hours as needed for severe pain. 12 tablet 0   Tiotropium Bromide-Olodaterol (STIOLTO RESPIMAT) 2.5-2.5 MCG/ACT AERS NEW PRESCRIPTION REQUEST: STIOLTO 2.5 MCG- INHALE TWO PUFFS BY MOUTH DAILY (Patient taking differently: Inhale 2 each into the lungs daily.) 12 g 3   albuterol (PROVENTIL) (2.5 MG/3ML) 0.083% nebulizer solution Take 3 mLs (2.5 mg total) by nebulization every 4 (four) hours as needed for wheezing or shortness of breath. (Patient not taking: Reported on 04/13/2023) 90 mL 12   folic acid (FOLVITE) 1 MG tablet Take 1 tablet (1 mg total) by mouth daily. (Patient not taking: Reported on 06/03/2023) 30 tablet 2   No current facility-administered medications for this encounter.    Physical Findings: The patient is in no acute distress. Patient is alert and oriented.  height is 6' (1.829 m). His temperature is 97.2 F (36.2 C) (abnormal). His blood pressure is 126/78 and his pulse is 62. His respiration is 20 and oxygen saturation is 99%. .   The patient is alert, in no acute distress  Lab Findings: Lab Results  Component  Value Date   WBC 5.9 06/03/2023   HGB 12.9 (L) 06/03/2023   HCT 41.1 06/03/2023   MCV 94.3 06/03/2023   PLT 157 06/03/2023     Radiographic Findings: DG Ribs Unilateral W/Chest Right  Result Date: 06/11/2023 CLINICAL DATA:  Fall onto rib, pain EXAM: RIGHT RIBS AND CHEST - 3+ VIEW COMPARISON:  06/03/2023 FINDINGS: No displaced fracture or other bone lesions are seen involving the ribs. There is no evidence of pneumothorax or pleural effusion. Unchanged dense, bandlike consolidation of the peripheral right midlung with internal biopsy marking  clip. Additional, less dense bandlike fibrotic change of the left midlung. No new airspace opacity. Heart size and mediastinal contours are within normal limits. IMPRESSION: 1. No displaced fracture or radiographic abnormality of the ribs to explain pain. 2. Unchanged dense, bandlike consolidation of the peripheral right midlung with internal biopsy marking clip. Additional, less dense bandlike fibrotic change of the left midlung. No new airspace opacity. Electronically Signed   By: Jearld Lesch M.D.   On: 06/11/2023 17:01   DG Chest 2 View  Result Date: 06/03/2023 CLINICAL DATA:  chest pain. History a lung cancer status post radiation EXAM: CHEST - 2 VIEW COMPARISON:  June fifth, 2024 January 29, 2023 FINDINGS: The cardiomediastinal silhouette is unchanged in contour. No pleural effusion. No pneumothorax. Revisualization of a band like opacity in the LEFT mid lung, possibly the sequela of radiation. RIGHT lateral peripheral opacity with internal metallic marker, better assessed on same day CT. Both of these are increased in conspicuity since February 2024. Visualized abdomen is unremarkable. Multilevel degenerative changes of the thoracic spine. IMPRESSION: 1. Revisualization of a band like opacity in the LEFT mid lung, possibly the sequela of radiation. 2. RIGHT lateral peripheral opacity with internal metallic marker, better assessed on same day CT. Electronically Signed   By: Meda Klinefelter M.D.   On: 06/03/2023 15:30   CT Angio Chest PE W and/or Wo Contrast  Result Date: 06/03/2023 CLINICAL DATA:  62 year old male with history of nontraumatic chest wall pain. EXAM: CT ANGIOGRAPHY CHEST WITH CONTRAST TECHNIQUE: Multidetector CT imaging of the chest was performed using the standard protocol during bolus administration of intravenous contrast. Multiplanar CT image reconstructions and MIPs were obtained to evaluate the vascular anatomy. RADIATION DOSE REDUCTION: This exam was performed according to  the departmental dose-optimization program which includes automated exposure control, adjustment of the mA and/or kV according to patient size and/or use of iterative reconstruction technique. CONTRAST:  OMNIPAQUE IOHEXOL 350 MG/ML SOLN COMPARISON:  Chest CTA 05/08/2023. FINDINGS: Cardiovascular: No filling defects within the pulmonary arterial tree to suggest pulmonary embolism. Heart size is normal. There is no significant pericardial fluid, thickening or pericardial calcification. There is aortic atherosclerosis, as well as atherosclerosis of the great vessels of the mediastinum and the coronary arteries, including calcified atherosclerotic plaque in the left main, left anterior descending, left circumflex and right coronary arteries. Mediastinum/Nodes: No pathologically enlarged mediastinal or hilar lymph nodes. Esophagus is unremarkable in appearance. No axillary lymphadenopathy. Lungs/Pleura: Widespread but patchy areas of ground-glass attenuation, septal thickening, and nodular and mass-like architectural distortion are again noted throughout the lungs bilaterally, most confluent in the periphery of the right upper lobe where there is again a large mass-like area extending to the overlying pleural surface with internal fiducial marker or bronchial valve best appreciated on axial image 71 of series 12 measuring 7.2 x 2.9 cm on today's study. Multiple other small calcified granulomas are noted, as well as noncalcified pulmonary  nodules, similar to the recent prior study, with the largest noncalcified pulmonary nodule in the periphery of the left lower lobe (axial image 90 of series 12) measuring 9 x 5 mm (mean diameter of 7 mm), new compared to the prior study. No pleural effusions. Diffuse bronchial wall thickening with moderate to severe centrilobular and paraseptal emphysema. Upper Abdomen: Aortic atherosclerosis. Musculoskeletal: There are no aggressive appearing lytic or blastic lesions noted in the  visualized portions of the skeleton. Review of the MIP images confirms the above findings. IMPRESSION: 1. No evidence of pulmonary embolism. 2. Extensive areas of what appear to be evolving post infectious or inflammatory scarring are again noted throughout the lungs bilaterally. Notably, however, in the periphery of the right upper lobe there is an increasingly aggressive appearing mass-like area which currently measures 7.2 x 2.9 cm, concerning for progressive neoplasm. Tissue sampling or further evaluation with nonemergent PET-CT should be considered to exclude malignancy. 3. Diffuse bronchial wall thickening with moderate to severe centrilobular and paraseptal emphysema; imaging findings suggestive of underlying COPD. 4. Aortic atherosclerosis, in addition to left main and three-vessel coronary artery disease. Please note that although the presence of coronary artery calcium documents the presence of coronary artery disease, the severity of this disease and any potential stenosis cannot be assessed on this non-gated CT examination. Assessment for potential risk factor modification, dietary therapy or pharmacologic therapy may be warranted, if clinically indicated. Aortic Atherosclerosis (ICD10-I70.0) and Emphysema (ICD10-J43.9). Electronically Signed   By: Trudie Reed M.D.   On: 06/03/2023 05:57    Impression/ Plan:    The patient has a history of SBRT to the right lung as well as chemoradiation treatment to the left lung.  He has a masslike area which is further developed within the lateral right lung overlapping with his prior site of SBRT.  This may represent recurrence, with imaging findings suspicious for this.  I did discussed with the patient proceeding with a PET scan to further characterize this area.  It would also be helpful to rule out additional disease elsewhere which would help [place] determine how aggressive to possibly be within the chest.  The patient may be a candidate for palliative  radiation treatment to the right lung, or could potentially receive a more aggressive regimen.  I discussed this plan with the patient and his family and all of their questions were answered.  They are amenable to such a treatment.  We will obtain a PET scan as soon as this can be arranged and then reviewed the findings with the patient.  The patient was seen in person today in clinic.  The total time spent on the patient's visit today was 28 minutes, including chart review, direct discussion/evaluation with the patient, and coordination of care.    Radene Gunning, M.D., Ph.D.

## 2023-06-28 ENCOUNTER — Other Ambulatory Visit: Payer: Medicaid Other

## 2023-07-02 ENCOUNTER — Other Ambulatory Visit: Payer: Self-pay

## 2023-07-02 ENCOUNTER — Emergency Department (HOSPITAL_COMMUNITY)
Admission: EM | Admit: 2023-07-02 | Discharge: 2023-07-02 | Disposition: A | Discharge status: Home / Self Care | Payer: Medicaid Other | Attending: Emergency Medicine | Admitting: Emergency Medicine

## 2023-07-02 DIAGNOSIS — J449 Chronic obstructive pulmonary disease, unspecified: Secondary | ICD-10-CM | POA: Insufficient documentation

## 2023-07-02 DIAGNOSIS — Z79899 Other long term (current) drug therapy: Secondary | ICD-10-CM | POA: Diagnosis not present

## 2023-07-02 DIAGNOSIS — I10 Essential (primary) hypertension: Secondary | ICD-10-CM | POA: Diagnosis not present

## 2023-07-02 DIAGNOSIS — Z7901 Long term (current) use of anticoagulants: Secondary | ICD-10-CM | POA: Insufficient documentation

## 2023-07-02 DIAGNOSIS — R1032 Left lower quadrant pain: Secondary | ICD-10-CM | POA: Insufficient documentation

## 2023-07-02 DIAGNOSIS — J45909 Unspecified asthma, uncomplicated: Secondary | ICD-10-CM | POA: Insufficient documentation

## 2023-07-02 DIAGNOSIS — R109 Unspecified abdominal pain: Secondary | ICD-10-CM | POA: Diagnosis present

## 2023-07-02 LAB — CBC WITH DIFFERENTIAL/PLATELET
Abs Immature Granulocytes: 0.02 10*3/uL (ref 0.00–0.07)
Basophils Absolute: 0 10*3/uL (ref 0.0–0.1)
Basophils Relative: 0 %
Eosinophils Absolute: 0 10*3/uL (ref 0.0–0.5)
Eosinophils Relative: 0 %
HCT: 42.6 % (ref 39.0–52.0)
Hemoglobin: 13.4 g/dL (ref 13.0–17.0)
Immature Granulocytes: 0 %
Lymphocytes Relative: 9 %
Lymphs Abs: 0.7 10*3/uL (ref 0.7–4.0)
MCH: 29.2 pg (ref 26.0–34.0)
MCHC: 31.5 g/dL (ref 30.0–36.0)
MCV: 92.8 fL (ref 80.0–100.0)
Monocytes Absolute: 0.5 10*3/uL (ref 0.1–1.0)
Monocytes Relative: 6 %
Neutro Abs: 7 10*3/uL (ref 1.7–7.7)
Neutrophils Relative %: 85 %
Platelets: 156 10*3/uL (ref 150–400)
RBC: 4.59 MIL/uL (ref 4.22–5.81)
RDW: 13.4 % (ref 11.5–15.5)
WBC: 8.3 10*3/uL (ref 4.0–10.5)
nRBC: 0 % (ref 0.0–0.2)

## 2023-07-02 LAB — COMPREHENSIVE METABOLIC PANEL
ALT: 13 U/L (ref 0–44)
AST: 17 U/L (ref 15–41)
Albumin: 3.9 g/dL (ref 3.5–5.0)
Alkaline Phosphatase: 90 U/L (ref 38–126)
Anion gap: 9 (ref 5–15)
BUN: 16 mg/dL (ref 8–23)
CO2: 28 mmol/L (ref 22–32)
Calcium: 9 mg/dL (ref 8.9–10.3)
Chloride: 102 mmol/L (ref 98–111)
Creatinine, Ser: 0.87 mg/dL (ref 0.61–1.24)
GFR, Estimated: >60 mL/min (ref >60)
Glucose, Bld: 128 mg/dL — ABNORMAL HIGH (ref 70–99)
Potassium: 3.6 mmol/L (ref 3.5–5.1)
Sodium: 139 mmol/L (ref 135–145)
Total Bilirubin: 0.5 mg/dL (ref 0.3–1.2)
Total Protein: 7.7 g/dL (ref 6.5–8.1)

## 2023-07-02 LAB — URINALYSIS, W/ REFLEX TO CULTURE (INFECTION SUSPECTED)
Bacteria, UA: NONE SEEN
Bilirubin Urine: NEGATIVE
Glucose, UA: NEGATIVE mg/dL
Hgb urine dipstick: NEGATIVE
Ketones, ur: NEGATIVE mg/dL
Leukocytes,Ua: NEGATIVE
Nitrite: NEGATIVE
Protein, ur: NEGATIVE mg/dL
Specific Gravity, Urine: 1.011 (ref 1.005–1.030)
pH: 7 (ref 5.0–8.0)

## 2023-07-02 LAB — LIPASE, BLOOD: Lipase: 27 U/L (ref 11–51)

## 2023-07-02 LAB — LACTIC ACID, PLASMA: Lactic Acid, Venous: 1.3 mmol/L (ref 0.5–1.9)

## 2023-07-02 MED ORDER — SUCRALFATE 1 GM/10ML PO SUSP: 1.0000 g | Freq: Three times a day (TID) | ORAL | 0 refills | Status: DC

## 2023-07-02 MED ORDER — ONDANSETRON HCL 4 MG/2ML IJ SOLN
4.0000 mg | Freq: Once | INTRAMUSCULAR | Status: AC
  Administered 2023-07-02: 4 mg via INTRAVENOUS
  Filled 2023-07-02: qty 2

## 2023-07-02 MED ORDER — PANTOPRAZOLE SODIUM 40 MG IV SOLR
40.0000 mg | Freq: Once | INTRAVENOUS | Status: AC
  Administered 2023-07-02: 40 mg via INTRAVENOUS
  Filled 2023-07-02: qty 10

## 2023-07-02 MED ORDER — MORPHINE SULFATE (PF) 4 MG/ML IV SOLN
4.0000 mg | Freq: Once | INTRAVENOUS | Status: AC
  Administered 2023-07-02: 4 mg via INTRAVENOUS
  Filled 2023-07-02: qty 1

## 2023-07-02 MED ORDER — PROCHLORPERAZINE EDISYLATE 10 MG/2ML IJ SOLN
5.0000 mg | Freq: Once | INTRAMUSCULAR | Status: AC
  Administered 2023-07-02: 5 mg via INTRAVENOUS
  Filled 2023-07-02: qty 2

## 2023-07-02 MED ORDER — SODIUM CHLORIDE 0.9 % IV SOLN: INTRAVENOUS | Status: DC

## 2023-07-02 MED ORDER — ONDANSETRON 4 MG PO TBDP: 4.0000 mg | ORAL_TABLET | Freq: Three times a day (TID) | ORAL | 0 refills | Status: DC | PRN

## 2023-07-02 MED ORDER — ALUM & MAG HYDROXIDE-SIMETH 200-200-20 MG/5ML PO SUSP
30.0000 mL | Freq: Once | ORAL | Status: AC
  Administered 2023-07-02: 30 mL via ORAL
  Filled 2023-07-02: qty 30

## 2023-07-02 NOTE — ED Provider Notes (Signed)
[place] EMERGENCY DEPARTMENT AT [facility] Provider Note  CSN: [id number] Arrival date & time: 07/02/23 0009  Chief Complaint(s) Abdominal Pain (Vomiting)  HPI Todd Estrada is a 62 y.o. male with a past medical history listed below including prior CVA with aphasia but able to answer yes or no questions here for left-sided pain.  Started today.  Endorses nausea and vomiting.  No diarrhea.  Pain in the left side of the abdomen.  The history is provided by the patient.    Past Medical History Past Medical History:  Diagnosis Date   Asthma    Atypical chest pain 08/13/2022   Community acquired pneumonia 09/14/2022   COPD (chronic obstructive pulmonary disease) (HCC)    Essential hypertension 08/19/2021   GERD (gastroesophageal reflux disease)    HAP (hospital-acquired pneumonia) 09/16/2022   History of tracheostomy    03/09/22-04/11/22   HLD (hyperlipidemia)    Hypertension    Hypokalemia 08/13/2022   Hypomagnesemia 08/13/2022   Lung cancer (HCC)    PAD (peripheral artery disease) (HCC)    Seizures (HCC) 06/02/2022   Sepsis (HCC) 08/13/2022   Sepsis due to pneumonia (HCC) 04/13/2023   Stroke (HCC) 02/2022   Patient Active Problem List   Diagnosis Date Noted   Right upper lobe pneumonia 04/13/2023   Buttock wound, right, subsequent encounter 03/01/2023   PAD (peripheral artery disease) (HCC) 01/06/2023   Chronic hypotension 01/06/2023   History of pulmonary embolism 10/12/2022   Cervical transverse process fracture (HCC) 09/10/2022   Frequent falls 08/27/2022   Aphasia as late effect of cerebrovascular accident 08/16/2022   Anemia of chronic disease 08/13/2022   Chronic heart failure with mildly reduced ejection fraction (HFmrEF) (HCC) 08/13/2022   Necrotizing pneumonia (HCC) 08/12/2022   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 Authorizing Provider  ondansetron (ZOFRAN-ODT) 4 MG disintegrating tablet Take 1 tablet (4 mg total) by mouth every 8 (eight) hours as needed for up to 3 days for nausea or vomiting. 07/02/23 07/05/23 Yes Estefana Taylor, Amadeo Garnet, MD  sucralfate (CARAFATE) 1 GM/10ML suspension Take 10 mLs (1 g total) by mouth 4 (four) times daily -  with meals and at bedtime. 07/02/23  Yes Brando Taves, Amadeo Garnet, MD  albuterol (PROVENTIL) (2.5 MG/3ML) 0.083% nebulizer solution Take 3 mLs (2.5 mg total) by nebulization every 4 (four) hours as needed for wheezing or shortness of breath. Patient not taking: Reported on 04/13/2023 08/16/22   Steffanie Rainwater, MD  apixaban (ELIQUIS) 5 MG TABS tablet TAKE 1 TABLET(5 MG) BY MOUTH TWICE DAILY Patient taking differently: Take 5 mg by mouth 2 (two) times daily. 04/11/23   Merrilyn Puma, MD  atorvastatin (LIPITOR) 40 MG tablet Take 1 tablet (40 mg total) by mouth daily. 01/03/23 01/03/24  Merrilyn Puma, MD  budesonide (PULMICORT) 0.5 MG/2ML nebulizer solution NEW PRESCRIPTION REQEUST: BUDESONIDE 0.5 MG/ - USE ONE VIAL TWICE DAILY Patient taking differently: Take 0.5 mg by nebulization in the morning and at  bedtime. 11/29/22   Martina Sinner, MD  folic acid (FOLVITE) 1 MG tablet Take 1 tablet (1 mg total) by mouth daily. Patient not taking: Reported on 06/03/2023 03/21/23   Merrilyn Puma, MD  levETIRAcetam (KEPPRA) 500 MG tablet Take 1 tablet (500 mg total) by mouth 2 (two) times daily. 12/07/22   Ihor Austin, NP  midodrine (PROAMATINE) 2.5 MG tablet  Take 1 tablet (2.5 mg total) by mouth 3 (three) times daily with meals. 01/06/23 07/05/23  Merrilyn Puma, MD  Multiple Vitamin (MULTIVITAMIN WITH MINERALS) TABS tablet Take 1 tablet by mouth daily. Patient taking differently: Take 1 tablet by mouth daily with breakfast. 09/20/22   Doran Stabler, DO  oxyCODONE-acetaminophen (PERCOCET/ROXICET) 5-325 MG tablet Take 1 tablet by mouth every 6 (six) hours as needed for severe pain. 06/03/23   Prosperi, Christian H, PA-C  Tiotropium Bromide-Olodaterol (STIOLTO RESPIMAT) 2.5-2.5 MCG/ACT AERS NEW PRESCRIPTION REQUEST: STIOLTO 2.5 MCG- INHALE TWO PUFFS BY MOUTH DAILY Patient taking differently: Inhale 2 each into the lungs daily. 11/29/22   Martina Sinner, MD                                                                                                                                    Allergies Patient has no known allergies.  Review of Systems Review of Systems As noted in HPI  Physical Exam Vital Signs  I have reviewed the triage vital signs BP (!) 151/91   Pulse 95   Temp 99.2 F (37.3 C) (Oral)   Resp 16   Ht 6' (1.829 m)   Wt 74.4 kg   SpO2 100%   BMI 22.24 kg/m   Physical Exam Vitals reviewed.  Constitutional:      General: He is not in acute distress.    Appearance: He is well-developed. He is not diaphoretic.  HENT:     Head: Normocephalic and atraumatic.     Right Ear: External ear normal.     Left Ear: External ear normal.     Nose: Nose normal.     Mouth/Throat:     Mouth: Mucous membranes are moist.  Eyes:     General: No scleral icterus.    Conjunctiva/sclera: Conjunctivae normal.  Neck:     Trachea: Phonation normal.  Cardiovascular:     Rate and Rhythm: Normal rate and regular rhythm.  Pulmonary:     Effort: Pulmonary effort is normal. No respiratory distress.     Breath sounds: No stridor.  Abdominal:     General: There is no distension.     Tenderness: There is abdominal tenderness in the left upper  quadrant and left lower quadrant. There is no guarding or rebound.  Musculoskeletal:        General: Normal range of motion.     Cervical back: Normal range of motion.  Neurological:     Mental Status: He is alert and oriented to person, place, and  time.  Psychiatric:        Behavior: Behavior normal.     ED Results and Treatments Labs (all labs ordered are listed, but only abnormal results are displayed) Labs Reviewed  COMPREHENSIVE METABOLIC PANEL - Abnormal; Notable for the following components:      Result Value   Glucose, Bld 128 (*)    All other components within normal limits  URINALYSIS, W/ REFLEX TO CULTURE (INFECTION SUSPECTED) - Abnormal; Notable for the following components:   Color, Urine STRAW (*)    All other components within normal limits  LIPASE, BLOOD  CBC WITH DIFFERENTIAL/PLATELET  LACTIC ACID, PLASMA                                                                                                                         EKG  EKG Interpretation Date/Time:    Ventricular Rate:    PR Interval:    QRS Duration:    QT Interval:    QTC Calculation:   R Axis:      Text Interpretation:         Radiology No results found.  Medications Ordered in ED Medications  0.9 %  sodium chloride infusion ( Intravenous New Bag/Given 07/02/23 0139)  ondansetron (ZOFRAN) injection 4 mg (4 mg Intravenous Given 07/02/23 0139)  morphine (PF) 4 MG/ML injection 4 mg (4 mg Intravenous Given 07/02/23 0139)  alum & mag hydroxide-simeth (MAALOX/MYLANTA) 200-200-20 MG/5ML suspension 30 mL (30 mLs Oral Given 07/02/23 0341)  pantoprazole (PROTONIX) injection 40 mg (40 mg Intravenous Given 07/02/23 0335)  prochlorperazine (COMPAZINE) injection 5 mg (5 mg Intravenous Given 07/02/23 1610)   Procedures Procedures  (including critical care time) Medical Decision Making / ED Course   Medical Decision Making Amount and/or Complexity of Data Reviewed Labs: ordered. Decision-making  details documented in ED Course.  Risk OTC drugs. Prescription drug management.    Patient presents for abdominal pain with nausea and vomiting. Abdomen benign but does have some mild discomfort in the left abdomen. CBC without leukocytosis or anemia. Metabolic panel without significant electrolyte derangements or renal sufficiency. No evidence of bili obstruction or pancreatitis UA without evidence of infection  Patient provided with IV fluids, pain medicine and antiemetics.  Patient also given Protonix and GI cocktail.  Able to tolerate p.o. intake.  Low suspicion for serious intra-abdominal inflammatory/infectious process requiring imaging at this time. Spoke with cousin.      Final Clinical Impression(s) / ED Diagnoses Final diagnoses:  LLQ abdominal pain   The patient appears reasonably screened and/or stabilized for discharge and I doubt any other medical condition or other [facility] requiring further screening, evaluation, or treatment in the ED at this time. I have discussed the findings, Dx and Tx plan with the patient/family who expressed understanding and agree(s) with the plan. Discharge instructions discussed at length. The patient/family was given strict return precautions who verbalized understanding of the instructions. No further questions at time of discharge.  Disposition: Discharge  Condition:  Good  ED Discharge Orders          Ordered    ondansetron (ZOFRAN-ODT) 4 MG disintegrating tablet  Every 8 hours PRN        07/02/23 0525    sucralfate (CARAFATE) 1 GM/10ML suspension  3 times daily with meals & bedtime        07/02/23 0526            Follow Up: Primary care provider  Call  to schedule an appointment for close follow up     This chart was dictated using voice recognition software.  Despite best efforts to proofread,  errors can occur which can change the documentation meaning.    Nira Conn, MD 07/02/23 [phone]

## 2023-07-02 NOTE — ED Triage Notes (Signed)
Pt arrived POV accompanied by his cousin. Pt has been having abd pain and vomiting for the last couple hours. Hx of CVA approx 1 year ago, pt is nonverbal due to this.

## 2023-07-03 ENCOUNTER — Ambulatory Visit: Payer: Medicaid Other | Admitting: Internal Medicine

## 2023-07-04 ENCOUNTER — Emergency Department (HOSPITAL_COMMUNITY): Payer: Medicaid Other

## 2023-07-04 ENCOUNTER — Other Ambulatory Visit: Payer: Self-pay

## 2023-07-04 ENCOUNTER — Inpatient Hospital Stay (HOSPITAL_COMMUNITY)
Admission: EM | Admit: 2023-07-04 | Discharge: 2023-07-09 | DRG: 418 | Disposition: A | Discharge status: Home / Self Care | Payer: Medicaid Other | Attending: Internal Medicine | Admitting: Internal Medicine

## 2023-07-04 ENCOUNTER — Inpatient Hospital Stay (HOSPITAL_COMMUNITY): Payer: Medicaid Other

## 2023-07-04 ENCOUNTER — Encounter (HOSPITAL_COMMUNITY): Payer: Self-pay

## 2023-07-04 DIAGNOSIS — K8050 Calculus of bile duct without cholangitis or cholecystitis without obstruction: Secondary | ICD-10-CM | POA: Diagnosis not present

## 2023-07-04 DIAGNOSIS — Z7901 Long term (current) use of anticoagulants: Secondary | ICD-10-CM

## 2023-07-04 DIAGNOSIS — I959 Hypotension, unspecified: Secondary | ICD-10-CM | POA: Diagnosis present

## 2023-07-04 DIAGNOSIS — K7581 Nonalcoholic steatohepatitis (NASH): Secondary | ICD-10-CM | POA: Diagnosis present

## 2023-07-04 DIAGNOSIS — Z9221 Personal history of antineoplastic chemotherapy: Secondary | ICD-10-CM

## 2023-07-04 DIAGNOSIS — I9589 Other hypotension: Secondary | ICD-10-CM | POA: Diagnosis not present

## 2023-07-04 DIAGNOSIS — E785 Hyperlipidemia, unspecified: Secondary | ICD-10-CM | POA: Diagnosis present

## 2023-07-04 DIAGNOSIS — Z66 Do not resuscitate: Secondary | ICD-10-CM | POA: Diagnosis present

## 2023-07-04 DIAGNOSIS — Z79899 Other long term (current) drug therapy: Secondary | ICD-10-CM | POA: Diagnosis not present

## 2023-07-04 DIAGNOSIS — J4489 Other specified chronic obstructive pulmonary disease: Secondary | ICD-10-CM | POA: Diagnosis present

## 2023-07-04 DIAGNOSIS — Z923 Personal history of irradiation: Secondary | ICD-10-CM

## 2023-07-04 DIAGNOSIS — C3411 Malignant neoplasm of upper lobe, right bronchus or lung: Secondary | ICD-10-CM | POA: Diagnosis not present

## 2023-07-04 DIAGNOSIS — C3412 Malignant neoplasm of upper lobe, left bronchus or lung: Secondary | ICD-10-CM | POA: Diagnosis not present

## 2023-07-04 DIAGNOSIS — Y92003 Bedroom of unspecified non-institutional (private) residence as the place of occurrence of the external cause: Secondary | ICD-10-CM | POA: Diagnosis not present

## 2023-07-04 DIAGNOSIS — K819 Cholecystitis, unspecified: Principal | ICD-10-CM

## 2023-07-04 DIAGNOSIS — I700 Atherosclerosis of aorta: Secondary | ICD-10-CM | POA: Diagnosis present

## 2023-07-04 DIAGNOSIS — I5022 Chronic systolic (congestive) heart failure: Secondary | ICD-10-CM | POA: Diagnosis present

## 2023-07-04 DIAGNOSIS — W06XXXA Fall from bed, initial encounter: Secondary | ICD-10-CM | POA: Diagnosis present

## 2023-07-04 DIAGNOSIS — K219 Gastro-esophageal reflux disease without esophagitis: Secondary | ICD-10-CM | POA: Diagnosis present

## 2023-07-04 DIAGNOSIS — K828 Other specified diseases of gallbladder: Secondary | ICD-10-CM | POA: Diagnosis not present

## 2023-07-04 DIAGNOSIS — I69351 Hemiplegia and hemiparesis following cerebral infarction affecting right dominant side: Secondary | ICD-10-CM

## 2023-07-04 DIAGNOSIS — Z7951 Long term (current) use of inhaled steroids: Secondary | ICD-10-CM

## 2023-07-04 DIAGNOSIS — D638 Anemia in other chronic diseases classified elsewhere: Secondary | ICD-10-CM | POA: Diagnosis not present

## 2023-07-04 DIAGNOSIS — K8012 Calculus of gallbladder with acute and chronic cholecystitis without obstruction: Secondary | ICD-10-CM | POA: Diagnosis not present

## 2023-07-04 DIAGNOSIS — K810 Acute cholecystitis: Secondary | ICD-10-CM | POA: Diagnosis present

## 2023-07-04 DIAGNOSIS — R4701 Aphasia: Secondary | ICD-10-CM | POA: Diagnosis present

## 2023-07-04 DIAGNOSIS — I493 Ventricular premature depolarization: Secondary | ICD-10-CM | POA: Diagnosis present

## 2023-07-04 DIAGNOSIS — Z800 Family history of malignant neoplasm of digestive organs: Secondary | ICD-10-CM

## 2023-07-04 DIAGNOSIS — D696 Thrombocytopenia, unspecified: Secondary | ICD-10-CM | POA: Diagnosis not present

## 2023-07-04 DIAGNOSIS — Z86711 Personal history of pulmonary embolism: Secondary | ICD-10-CM | POA: Diagnosis not present

## 2023-07-04 DIAGNOSIS — G40909 Epilepsy, unspecified, not intractable, without status epilepticus: Secondary | ICD-10-CM | POA: Diagnosis present

## 2023-07-04 DIAGNOSIS — J439 Emphysema, unspecified: Secondary | ICD-10-CM | POA: Diagnosis not present

## 2023-07-04 DIAGNOSIS — J449 Chronic obstructive pulmonary disease, unspecified: Secondary | ICD-10-CM | POA: Diagnosis not present

## 2023-07-04 DIAGNOSIS — I110 Hypertensive heart disease with heart failure: Secondary | ICD-10-CM | POA: Diagnosis present

## 2023-07-04 DIAGNOSIS — K7689 Other specified diseases of liver: Secondary | ICD-10-CM | POA: Diagnosis present

## 2023-07-04 DIAGNOSIS — R159 Full incontinence of feces: Secondary | ICD-10-CM | POA: Diagnosis present

## 2023-07-04 DIAGNOSIS — I739 Peripheral vascular disease, unspecified: Secondary | ICD-10-CM | POA: Diagnosis present

## 2023-07-04 DIAGNOSIS — Z87898 Personal history of other specified conditions: Secondary | ICD-10-CM

## 2023-07-04 DIAGNOSIS — I509 Heart failure, unspecified: Secondary | ICD-10-CM | POA: Diagnosis not present

## 2023-07-04 DIAGNOSIS — Z841 Family history of disorders of kidney and ureter: Secondary | ICD-10-CM

## 2023-07-04 DIAGNOSIS — Z8673 Personal history of transient ischemic attack (TIA), and cerebral infarction without residual deficits: Secondary | ICD-10-CM

## 2023-07-04 DIAGNOSIS — Z808 Family history of malignant neoplasm of other organs or systems: Secondary | ICD-10-CM

## 2023-07-04 DIAGNOSIS — Z01810 Encounter for preprocedural cardiovascular examination: Secondary | ICD-10-CM

## 2023-07-04 DIAGNOSIS — K8067 Calculus of gallbladder and bile duct with acute and chronic cholecystitis with obstruction: Secondary | ICD-10-CM | POA: Diagnosis not present

## 2023-07-04 DIAGNOSIS — Z1152 Encounter for screening for COVID-19: Secondary | ICD-10-CM

## 2023-07-04 DIAGNOSIS — S0003XA Contusion of scalp, initial encounter: Secondary | ICD-10-CM | POA: Diagnosis present

## 2023-07-04 DIAGNOSIS — I2510 Atherosclerotic heart disease of native coronary artery without angina pectoris: Secondary | ICD-10-CM | POA: Diagnosis present

## 2023-07-04 DIAGNOSIS — I69320 Aphasia following cerebral infarction: Secondary | ICD-10-CM

## 2023-07-04 DIAGNOSIS — R569 Unspecified convulsions: Secondary | ICD-10-CM

## 2023-07-04 DIAGNOSIS — Z87891 Personal history of nicotine dependence: Secondary | ICD-10-CM | POA: Diagnosis not present

## 2023-07-04 DIAGNOSIS — I10 Essential (primary) hypertension: Secondary | ICD-10-CM | POA: Diagnosis not present

## 2023-07-04 LAB — LACTIC ACID, PLASMA: Lactic Acid, Venous: 1.3 mmol/L (ref 0.5–1.9)

## 2023-07-04 LAB — CBC WITH DIFFERENTIAL/PLATELET
Abs Immature Granulocytes: 0.04 10*3/uL (ref 0.00–0.07)
Basophils Absolute: 0 10*3/uL (ref 0.0–0.1)
Basophils Relative: 0 %
Eosinophils Absolute: 0 10*3/uL (ref 0.0–0.5)
Eosinophils Relative: 0 %
HCT: 44.3 % (ref 39.0–52.0)
Hemoglobin: 14.1 g/dL (ref 13.0–17.0)
Immature Granulocytes: 0 %
Lymphocytes Relative: 7 %
Lymphs Abs: 0.9 10*3/uL (ref 0.7–4.0)
MCH: 29.8 pg (ref 26.0–34.0)
MCHC: 31.8 g/dL (ref 30.0–36.0)
MCV: 93.7 fL (ref 80.0–100.0)
Monocytes Absolute: 1.1 10*3/uL — ABNORMAL HIGH (ref 0.1–1.0)
Monocytes Relative: 8 %
Neutro Abs: 11.3 10*3/uL — ABNORMAL HIGH (ref 1.7–7.7)
Neutrophils Relative %: 85 %
Platelets: 147 10*3/uL — ABNORMAL LOW (ref 150–400)
RBC: 4.73 MIL/uL (ref 4.22–5.81)
RDW: 13.6 % (ref 11.5–15.5)
WBC: 13.4 10*3/uL — ABNORMAL HIGH (ref 4.0–10.5)
nRBC: 0 % (ref 0.0–0.2)

## 2023-07-04 LAB — ECHOCARDIOGRAM COMPLETE
Area-P 1/2: 2.54 cm2
Height: 72 in
S' Lateral: 3.9 cm
Weight: 2640 oz

## 2023-07-04 LAB — COMPREHENSIVE METABOLIC PANEL
ALT: 15 U/L (ref 0–44)
AST: 19 U/L (ref 15–41)
Albumin: 4.2 g/dL (ref 3.5–5.0)
Alkaline Phosphatase: 92 U/L (ref 38–126)
Anion gap: 8 (ref 5–15)
BUN: 14 mg/dL (ref 8–23)
CO2: 28 mmol/L (ref 22–32)
Calcium: 9.3 mg/dL (ref 8.9–10.3)
Chloride: 102 mmol/L (ref 98–111)
Creatinine, Ser: 0.95 mg/dL (ref 0.61–1.24)
GFR, Estimated: >60 mL/min (ref >60)
Glucose, Bld: 116 mg/dL — ABNORMAL HIGH (ref 70–99)
Potassium: 3.6 mmol/L (ref 3.5–5.1)
Sodium: 138 mmol/L (ref 135–145)
Total Bilirubin: 1.7 mg/dL — ABNORMAL HIGH (ref 0.3–1.2)
Total Protein: 8.5 g/dL — ABNORMAL HIGH (ref 6.5–8.1)

## 2023-07-04 LAB — URINALYSIS, W/ REFLEX TO CULTURE (INFECTION SUSPECTED)
Bilirubin Urine: NEGATIVE
Glucose, UA: NEGATIVE mg/dL
Hgb urine dipstick: NEGATIVE
Ketones, ur: NEGATIVE mg/dL
Leukocytes,Ua: NEGATIVE
Nitrite: NEGATIVE
Protein, ur: 30 mg/dL — AB
Specific Gravity, Urine: >1.046 — ABNORMAL HIGH (ref 1.005–1.030)
pH: 6 (ref 5.0–8.0)

## 2023-07-04 LAB — RESP PANEL BY RT-PCR (RSV, FLU A&B, COVID)  RVPGX2
Influenza A by PCR: NEGATIVE
Influenza B by PCR: NEGATIVE
Resp Syncytial Virus by PCR: NEGATIVE
SARS Coronavirus 2 by RT PCR: NEGATIVE

## 2023-07-04 LAB — PHOSPHORUS: Phosphorus: 3.1 mg/dL (ref 2.5–4.6)

## 2023-07-04 LAB — APTT: aPTT: 25 seconds (ref 24–36)

## 2023-07-04 LAB — PROTIME-INR
INR: 1.5 — ABNORMAL HIGH (ref 0.8–1.2)
Prothrombin Time: 17.9 seconds — ABNORMAL HIGH (ref 11.4–15.2)

## 2023-07-04 LAB — LIPASE, BLOOD: Lipase: 23 U/L (ref 11–51)

## 2023-07-04 LAB — MAGNESIUM: Magnesium: 1.8 mg/dL (ref 1.7–2.4)

## 2023-07-04 MED ORDER — METRONIDAZOLE 500 MG/100ML IV SOLN
500.0000 mg | Freq: Two times a day (BID) | INTRAVENOUS | Status: DC
  Administered 2023-07-04 – 2023-07-05 (×3): 500 mg via INTRAVENOUS
  Filled 2023-07-04 (×3): qty 100

## 2023-07-04 MED ORDER — SODIUM CHLORIDE 0.9 % IV BOLUS
1000.0000 mL | Freq: Once | INTRAVENOUS | Status: AC
  Administered 2023-07-04: 1000 mL via INTRAVENOUS

## 2023-07-04 MED ORDER — UMECLIDINIUM BROMIDE 62.5 MCG/ACT IN AEPB
1.0000 | INHALATION_SPRAY | Freq: Every day | RESPIRATORY_TRACT | Status: DC
  Administered 2023-07-04 – 2023-07-09 (×6): 1 via RESPIRATORY_TRACT
  Filled 2023-07-04: qty 7

## 2023-07-04 MED ORDER — SODIUM CHLORIDE (PF) 0.9 % IJ SOLN
INTRAMUSCULAR | Status: AC
  Filled 2023-07-04: qty 50

## 2023-07-04 MED ORDER — ONDANSETRON HCL 4 MG/2ML IJ SOLN: 4.0000 mg | Freq: Four times a day (QID) | INTRAMUSCULAR | Status: DC | PRN

## 2023-07-04 MED ORDER — FLEET ENEMA 7-19 GM/118ML RE ENEM: 1.0000 | ENEMA | Freq: Once | RECTAL | Status: DC | PRN

## 2023-07-04 MED ORDER — SODIUM CHLORIDE 0.9 % IV SOLN
2.0000 g | Freq: Once | INTRAVENOUS | Status: AC
  Administered 2023-07-04: 2 g via INTRAVENOUS
  Filled 2023-07-04: qty 20

## 2023-07-04 MED ORDER — ARFORMOTEROL TARTRATE 15 MCG/2ML IN NEBU
15.0000 ug | INHALATION_SOLUTION | Freq: Two times a day (BID) | RESPIRATORY_TRACT | Status: DC
  Administered 2023-07-04 – 2023-07-09 (×11): 15 ug via RESPIRATORY_TRACT
  Filled 2023-07-04 (×11): qty 2

## 2023-07-04 MED ORDER — PANTOPRAZOLE SODIUM 40 MG IV SOLR
40.0000 mg | INTRAVENOUS | Status: DC
  Administered 2023-07-04 – 2023-07-08 (×5): 40 mg via INTRAVENOUS
  Filled 2023-07-04 (×6): qty 10

## 2023-07-04 MED ORDER — POTASSIUM CHLORIDE IN NACL 20-0.9 MEQ/L-% IV SOLN
INTRAVENOUS | Status: AC
  Filled 2023-07-04: qty 1000

## 2023-07-04 MED ORDER — ACETAMINOPHEN 650 MG RE SUPP: 650.0000 mg | Freq: Four times a day (QID) | RECTAL | Status: DC | PRN

## 2023-07-04 MED ORDER — MIDODRINE HCL 5 MG PO TABS
2.5000 mg | ORAL_TABLET | Freq: Two times a day (BID) | ORAL | Status: DC
  Administered 2023-07-04 – 2023-07-09 (×10): 2.5 mg via ORAL
  Filled 2023-07-04 (×12): qty 1

## 2023-07-04 MED ORDER — ACETAMINOPHEN 325 MG PO TABS
650.0000 mg | ORAL_TABLET | Freq: Once | ORAL | Status: AC
  Administered 2023-07-04: 650 mg via ORAL
  Filled 2023-07-04: qty 2

## 2023-07-04 MED ORDER — POTASSIUM CHLORIDE IN NACL 20-0.9 MEQ/L-% IV SOLN
INTRAVENOUS | Status: DC
  Filled 2023-07-04: qty 1000

## 2023-07-04 MED ORDER — ORAL CARE MOUTH RINSE: 15.0000 mL | OROMUCOSAL | Status: DC | PRN

## 2023-07-04 MED ORDER — ONDANSETRON HCL 4 MG PO TABS: 4.0000 mg | ORAL_TABLET | Freq: Four times a day (QID) | ORAL | Status: DC | PRN

## 2023-07-04 MED ORDER — SODIUM CHLORIDE 0.9 % IV SOLN
2.0000 g | INTRAVENOUS | Status: DC
  Administered 2023-07-05: 2 g via INTRAVENOUS
  Filled 2023-07-04: qty 20

## 2023-07-04 MED ORDER — FENTANYL CITRATE PF 50 MCG/ML IJ SOSY
25.0000 ug | PREFILLED_SYRINGE | INTRAMUSCULAR | Status: DC | PRN
  Administered 2023-07-09 (×2): 25 ug via INTRAVENOUS
  Filled 2023-07-04 (×2): qty 1

## 2023-07-04 MED ORDER — IOHEXOL 300 MG/ML  SOLN: 100.0000 mL | Freq: Once | INTRAMUSCULAR | Status: DC | PRN

## 2023-07-04 MED ORDER — ONDANSETRON HCL 4 MG/2ML IJ SOLN
4.0000 mg | Freq: Once | INTRAMUSCULAR | Status: AC
  Administered 2023-07-04: 4 mg via INTRAVENOUS
  Filled 2023-07-04: qty 2

## 2023-07-04 MED ORDER — LEVETIRACETAM 500 MG PO TABS
500.0000 mg | ORAL_TABLET | Freq: Two times a day (BID) | ORAL | Status: DC
  Administered 2023-07-04 – 2023-07-09 (×11): 500 mg via ORAL
  Filled 2023-07-04 (×11): qty 1

## 2023-07-04 MED ORDER — PERFLUTREN LIPID MICROSPHERE
1.0000 mL | INTRAVENOUS | Status: AC | PRN
  Administered 2023-07-04: 5 mL via INTRAVENOUS

## 2023-07-04 MED ORDER — IOHEXOL 300 MG/ML  SOLN
100.0000 mL | Freq: Once | INTRAMUSCULAR | Status: AC | PRN
  Administered 2023-07-04: 100 mL via INTRAVENOUS

## 2023-07-04 MED ORDER — ACETAMINOPHEN 325 MG PO TABS
650.0000 mg | ORAL_TABLET | Freq: Four times a day (QID) | ORAL | Status: DC | PRN
  Administered 2023-07-04 (×2): 650 mg via ORAL
  Filled 2023-07-04 (×2): qty 2

## 2023-07-04 NOTE — ED Notes (Signed)
Nurse informed of temp.

## 2023-07-04 NOTE — H&P (Signed)
History and Physical    Patient: Todd Estrada DGL:[id number] DOB: 08-13-1961 DOA: 07/04/2023 DOS: the patient was seen and examined on 07/04/2023 PCP: [name], MD  Patient coming from: Home  Chief Complaint:  Chief Complaint  Patient presents with   Fall   Fever   HPI: Todd Estrada is a 62 y.o. male with medical history significant of asthma/emphysema, atypical chest pain, community-acquired pneumonia, essential hypertension, hypotension currently on midodrine 2.5 mg p.o. 3 times daily, GERD, history of sepsis due to pneumonia, hospital-acquired pneumonia, tracheostomy, hyperlipidemia, hypertension, hypokalemia, hypomagnesemia, lung cancer, PAD seizure disorder who was brought to the emergency department via private vehicle the due to having a fall, nausea, multiple episodes of emesis and fever.  He was seen in the emergency department on 07/02/2023 early a.m., discharged home but his symptoms have gotten worse.He denied chest pain, palpitations, diaphoresis, PND, orthopnea or recent pitting edema of the lower extremities.  No  melena or hematochezia.  No flank pain, dysuria, frequency or hematuria.  No polyuria, polydipsia, polyphagia.  Lab work: His urinalysis showed specific gravity greater than 1.046, protein of 30 mg/dL and rare bacteria microscopic examination.  CBC showed a white count of 13.4 with 85% neutrophils, hemoglobin 14.1 g/dL platelets 518.  Coronavirus and influenza PCR was negative.  CMP showed a total protein of 8.5 g/dL, glucose of 841 and total bilirubin 1.7 mg/dL.  The rest of the CMP measurements were normal.  Unremarkable lactic acid and lipase.  Imaging: Portable chest radiograph with unchanged consolidation/scarring in the bilateral mid lungs.  No cardiomegaly's cardiomediastinal silhouette is stable.  CT head old left MCA territory infarct and old left cerebellar infarcts.  CT cervical spine with no acute fracture or static subluxation of the cervical spine.  CT  abdomen/pelvis with gallbladder distention and edema suspicious for acute cholecystitis.  No calcified gallstones, ultrasound recommended.  There was severe atherosclerosis with chronic occlusions of the left common iliac and bilateral SFA.  Abdominal ultrasound shows cholelithiasis with gallbladder wall thickening and small volume of pericholecystic fluid concerning for acute cholecystitis.  Suspicion for trace intrahepatic biliary duct dilatation.  CBD was not well-seen.   ED course: Initial vital signs were temperature 100.3 F, pulse 115, respiration 18, BP 90/67 mmHg O2 sat 95% on room air.  The patient received 1000 mL of normal saline bolus, ondansetron 4 mg IVP and ceftriaxone 2 g IVPB.  Review of Systems: As mentioned in the history of present illness. All other systems reviewed and are negative. Past Medical History:  Diagnosis Date   Asthma    Atypical chest pain 08/13/2022   Community acquired pneumonia 09/14/2022   COPD (chronic obstructive pulmonary disease) (HCC)    Essential hypertension 08/19/2021   GERD (gastroesophageal reflux disease)    HAP (hospital-acquired pneumonia) 09/16/2022   History of tracheostomy    03/09/22-04/11/22   HLD (hyperlipidemia)    Hypertension    Hypokalemia 08/13/2022   Hypomagnesemia 08/13/2022   Lung cancer (HCC)    PAD (peripheral artery disease) (HCC)    Seizures (HCC) 06/02/2022   Sepsis (HCC) 08/13/2022   Sepsis due to pneumonia (HCC) 04/13/2023   Stroke (HCC) 02/2022   Past Surgical History:  Procedure Laterality Date   BRONCHIAL BIOPSY  05/30/2022   Procedure: BRONCHIAL BIOPSIES;  Surgeon: Leslye Peer, MD;  Location: [facility] ENDOSCOPY;  Service: Pulmonary;;   BRONCHIAL BRUSHINGS  05/30/2022   Procedure: BRONCHIAL BRUSHINGS;  Surgeon: Leslye Peer, MD;  Location: [facility] ENDOSCOPY;  Service: Pulmonary;;  BRONCHIAL NEEDLE ASPIRATION BIOPSY  05/30/2022   Procedure: BRONCHIAL NEEDLE ASPIRATION BIOPSIES;  Surgeon: Leslye Peer, MD;   Location: [facility] ENDOSCOPY;  Service: Pulmonary;;   BRONCHIAL WASHINGS  05/30/2022   Procedure: BRONCHIAL WASHINGS;  Surgeon: Leslye Peer, MD;  Location: [facility] ENDOSCOPY;  Service: Pulmonary;;   ESOPHAGOGASTRODUODENOSCOPY (EGD) WITH PROPOFOL N/A 03/11/2022   Procedure: ESOPHAGOGASTRODUODENOSCOPY (EGD) WITH PROPOFOL;  Surgeon: Violeta Gelinas, MD;  Location: [facility] ENDOSCOPY;  Service: General;  Laterality: N/A;   FIDUCIAL MARKER PLACEMENT  05/30/2022   Procedure: FIDUCIAL MARKER PLACEMENT;  Surgeon: Leslye Peer, MD;  Location: [facility] ENDOSCOPY;  Service: Pulmonary;;   IR ANGIO INTRA EXTRACRAN SEL COM CAROTID INNOMINATE UNI R MOD SED  03/02/2022   IR CT HEAD LTD  03/02/2022   IR PERCUTANEOUS ART THROMBECTOMY/INFUSION INTRACRANIAL INC DIAG ANGIO  03/02/2022   PEG PLACEMENT N/A 03/11/2022   Procedure: PERCUTANEOUS ENDOSCOPIC GASTROSTOMY (PEG) PLACEMENT;  Surgeon: Violeta Gelinas, MD;  Location: [facility] ENDOSCOPY;  Service: General;  Laterality: N/A;   RADIOLOGY WITH ANESTHESIA N/A 03/02/2022   Procedure: IR WITH ANESTHESIA;  Surgeon: Julieanne Cotton, MD;  Location: [facility] OR;  Service: Radiology;  Laterality: N/A;   VIDEO BRONCHOSCOPY WITH RADIAL ENDOBRONCHIAL ULTRASOUND  05/30/2022   Procedure: VIDEO BRONCHOSCOPY WITH RADIAL ENDOBRONCHIAL ULTRASOUND;  Surgeon: Leslye Peer, MD;  Location: [facility] ENDOSCOPY;  Service: Pulmonary;;   Social History:  reports that he quit smoking about 14 months ago. His smoking use included cigarettes. He has never been exposed to tobacco smoke. He has never used smokeless tobacco. He reports that he does not currently use alcohol. He reports that he does not use drugs.  No Known Allergies  Family History  Problem Relation Age of Onset   Throat cancer Mother    Liver cancer Father    Kidney failure Sister    Cancer - Lung Paternal Uncle     Prior to Admission medications   Medication Sig Start Date End Date Taking? Authorizing Provider  albuterol (PROVENTIL) (2.5 MG/3ML) 0.083%  nebulizer solution Take 3 mLs (2.5 mg total) by nebulization every 4 (four) hours as needed for wheezing or shortness of breath. Patient not taking: Reported on 04/13/2023 08/16/22   Steffanie Rainwater, MD  apixaban (ELIQUIS) 5 MG TABS tablet TAKE 1 TABLET(5 MG) BY MOUTH TWICE DAILY Patient taking differently: Take 5 mg by mouth 2 (two) times daily. 04/11/23   Merrilyn Puma, MD  atorvastatin (LIPITOR) 40 MG tablet Take 1 tablet (40 mg total) by mouth daily. 01/03/23 01/03/24  Merrilyn Puma, MD  budesonide (PULMICORT) 0.5 MG/2ML nebulizer solution NEW PRESCRIPTION REQEUST: BUDESONIDE 0.5 MG/ - USE ONE VIAL TWICE DAILY Patient taking differently: Take 0.5 mg by nebulization in the morning and at bedtime. 11/29/22   Martina Sinner, MD  folic acid (FOLVITE) 1 MG tablet Take 1 tablet (1 mg total) by mouth daily. Patient not taking: Reported on 06/03/2023 03/21/23   Merrilyn Puma, MD  levETIRAcetam (KEPPRA) 500 MG tablet Take 1 tablet (500 mg total) by mouth 2 (two) times daily. 12/07/22   Ihor Austin, NP  midodrine (PROAMATINE) 2.5 MG tablet Take 1 tablet (2.5 mg total) by mouth 3 (three) times daily with meals. 01/06/23 07/05/23  Merrilyn Puma, MD  Multiple Vitamin (MULTIVITAMIN WITH MINERALS) TABS tablet Take 1 tablet by mouth daily. Patient taking differently: Take 1 tablet by mouth daily with breakfast. 09/20/22   Doran Stabler, DO  ondansetron (ZOFRAN-ODT) 4 MG disintegrating tablet Take 1 tablet (4 mg total) by mouth every  8 (eight) hours as needed for up to 3 days for nausea or vomiting. 07/02/23 07/05/23  Cardama, Amadeo Garnet, MD  oxyCODONE-acetaminophen (PERCOCET/ROXICET) 5-325 MG tablet Take 1 tablet by mouth every 6 (six) hours as needed for severe pain. 06/03/23   Prosperi, Christian H, PA-C  sucralfate (CARAFATE) 1 GM/10ML suspension Take 10 mLs (1 g total) by mouth 4 (four) times daily -  with meals and at bedtime. 07/02/23   Nira Conn, MD  Tiotropium Bromide-Olodaterol  (STIOLTO RESPIMAT) 2.5-2.5 MCG/ACT AERS NEW PRESCRIPTION REQUEST: STIOLTO 2.5 MCG- INHALE TWO PUFFS BY MOUTH DAILY Patient taking differently: Inhale 2 each into the lungs daily. 11/29/22   Martina Sinner, MD    Physical Exam: Vitals:   07/04/23 0500 07/04/23 0600 07/04/23 0630 07/04/23 0712  BP: (!) 140/68 109/75 111/79   Pulse: 87 90 89   Resp: 18 18    Temp:    99.9 F (37.7 C)  TempSrc:    Oral  SpO2: 92% 93% 93%   Weight:      Height:       Physical Exam Vitals and nursing note reviewed.  Constitutional:      General: He is awake. He is not in acute distress.    Appearance: He is normal weight. He is ill-appearing.  HENT:     Head: Normocephalic.     Nose: No rhinorrhea.     Mouth/Throat:     Mouth: Mucous membranes are dry.  Eyes:     General: No scleral icterus.    Pupils: Pupils are equal, round, and reactive to light.  Neck:     Vascular: No JVD.  Cardiovascular:     Rate and Rhythm: Normal rate and regular rhythm.     Heart sounds: S1 normal and S2 normal.  Pulmonary:     Effort: Pulmonary effort is normal.     Breath sounds: Normal breath sounds. No wheezing, rhonchi or rales.  Abdominal:     General: Bowel sounds are normal. There is no distension.     Palpations: Abdomen is soft.     Tenderness: There is abdominal tenderness. There is no right CVA tenderness, left CVA tenderness, guarding or rebound.  Musculoskeletal:     Cervical back: Neck supple.     Right lower leg: No edema.     Left lower leg: No edema.  Skin:    General: Skin is warm and dry.  Neurological:     Mental Status: He is alert. Mental status is at baseline.     Sensory: Sensory deficit present.     Motor: Weakness present.     Comments: Right-sided hemiparesis.  Psychiatric:        Mood and Affect: Mood normal.        Behavior: Behavior is cooperative.   Data Reviewed:  Results are pending, will review when available.  Assessment and Plan: Principal Problem:   Acute  cholecystitis Admit to telemetry/inpatient. Continue IV fluids. Continue ceftriaxone 1 g every 24 hours.   Continue metronidazole 500 mg IVPB q 12 hr. Analgesics as needed. Antiemetics as needed. Follow-up blood culture and sensitivity Follow CBC and CMP in a.m. General surgery input appreciated.  Active Problems:   History of pulmonary embolism Holding apixaban. Check PT/INR. Resume anticoagulation once cleared by surgery.    History of seizures Continue levetiracetam 500 mg p.o. twice daily.    Hx of ischemic left MCA stroke Resulting in   Aphasia and right-sided hemiparesis  as late effect of  cerebrovascular accident Supportive care.    Dyslipidemia Hold atorvastatin due to elevated bilirubin. Will resume after surgery performed.    Emphysema lung (HCC) Continue Stiolto Respimat or formulary equivalent. Nebulized SABA as needed.    Chronic heart failure with mildly reduced ejection fraction (HFmrEF) (HCC) No signs of decompensation. Cardiology consulted for preop evaluation. Check transthoracic echocardiogram.    Chronic hypotension Continue midodrine 2.5 mg p.o. twice daily.      Advance Care Planning:   Code Status: DNR   Consults: CCS and cardiology.  Family Communication:   Severity of Illness: The appropriate patient status for this patient is INPATIENT. Inpatient status is judged to be reasonable and necessary in order to provide the required intensity of service to ensure the patient's safety. The patient's presenting symptoms, physical exam findings, and initial radiographic and laboratory data in the context of their chronic comorbidities is felt to place them at high risk for further clinical deterioration. Furthermore, it is not anticipated that the patient will be medically stable for discharge from the hospital within 2 midnights of admission.   * I certify that at the point of admission it is my clinical judgment that the patient will require  inpatient hospital care spanning beyond 2 midnights from the point of admission due to high intensity of service, high risk for further deterioration and high frequency of surveillance required.*  Author: Bobette Mo, MD 07/04/2023 7:19 AM  For on call review [email or web address].   This document was prepared using Dragon voice recognition software and may contain some unintended transcription errors.

## 2023-07-04 NOTE — Consult Note (Addendum)
Cardiology Consultation   Patient ID: Todd Estrada MRN: [id number]; DOB: July 04, 1961  Admit date: 07/04/2023 Date of Consult: 07/04/2023  PCP:  [name], MD   [facility] Providers Cardiologist:  None        Patient Profile:   Todd Estrada is a 62 y.o. 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 Robb Matar.  History of Present Illness:   Todd Estrada with above hx, no know CAD though on CTA of chest in June 2024 +atherosclerosis of coronary arteries including calcified atherosclerotic plaque in the LM, LAD, LCX and RCA.   Echo done 09/2022 with EF 45-50%, global hypokinesis, RV normal. Normal valves.  Prior echos in 02/2022 and 08/2021 EF 40-45% G1DD.    He has PAD with ABIs 2023 with moderate Rt lower extremity arterial disease on severe on the left.   Lung cancer stage IIb non small cell presented with upper Lt lobe lung mass 05/2022 and also non-small cell lung cancer, squamous cell carcinoma presented with right upper lobe lung nodule.  He has had chemoradiation last cycle 07/2022 then lost to follow up.  Recently seen by oncology and he has progressive disease with pleural based Rt lung mass and questionable chest wall invasion.  Was to radiation oncology for palliative radiotherapy.  Recent admit in April for Rt upper lobe PNA, sepsis due to PNA, he did complain of chest pain at that time.  Negative hs troponins.  3 and 4.    He has been seen in ER for Rt sided chest pain,  most likely due to lung cancer, troponins have been 4-5 with these ER visits.   Hx of PE on Eliquis, COPD and hypotension on midodrine with prior hx of hypertension.  Now admitted with  nausea, emesis, fever acute chole.  Surgery to see.    Na 138 K+ 3.6 Cr 0.95 lipase 23 AST 19 ALT 15  WBC 13.4 Hgb 14.1 plts 147  Neg COVID and neg Flu u/a neg  PCXR no new findings - no cardiomegaly   EKG:  The EKG was personally reviewed and demonstrates:  from 06/11/23 SR with no acute changes  will order one for today  Telemetry:  Telemetry was personally reviewed and demonstrates:  SR with occ PVCs trigeminal at times   Pt denies chest pain - he is aphasic, shakes head yes and no.    BP 96/68 to 162/146 ? If accurate + febrile R 17-20 P 80s   Past Medical History:  Diagnosis Date   Asthma    Atypical chest pain 08/13/2022   Community acquired pneumonia 09/14/2022   COPD (chronic obstructive pulmonary disease) (HCC)    Essential hypertension 08/19/2021   GERD (gastroesophageal reflux disease)    HAP (hospital-acquired pneumonia) 09/16/2022   History of tracheostomy    03/09/22-04/11/22   HLD (hyperlipidemia)    Hypertension    Hypokalemia 08/13/2022   Hypomagnesemia 08/13/2022   Lung cancer (HCC)    PAD (peripheral artery disease) (HCC)    Seizures (HCC) 06/02/2022   Sepsis (HCC) 08/13/2022   Sepsis due to pneumonia (HCC) 04/13/2023   Stroke (HCC) 02/2022    Past Surgical History:  Procedure Laterality Date  BRONCHIAL BIOPSY  05/30/2022   Procedure: BRONCHIAL BIOPSIES;  Surgeon: Leslye Peer, MD;  Location: [facility] ENDOSCOPY;  Service: Pulmonary;;   BRONCHIAL BRUSHINGS  05/30/2022   Procedure: BRONCHIAL BRUSHINGS;  Surgeon: Leslye Peer, MD;  Location: [facility] ENDOSCOPY;  Service: Pulmonary;;   BRONCHIAL NEEDLE ASPIRATION BIOPSY  05/30/2022   Procedure: BRONCHIAL NEEDLE ASPIRATION BIOPSIES;  Surgeon: Leslye Peer, MD;  Location: [facility] ENDOSCOPY;  Service: Pulmonary;;   BRONCHIAL WASHINGS  05/30/2022   Procedure: BRONCHIAL WASHINGS;  Surgeon: Leslye Peer, MD;  Location: [facility] ENDOSCOPY;  Service: Pulmonary;;   ESOPHAGOGASTRODUODENOSCOPY (EGD) WITH PROPOFOL N/A 03/11/2022    Procedure: ESOPHAGOGASTRODUODENOSCOPY (EGD) WITH PROPOFOL;  Surgeon: Violeta Gelinas, MD;  Location: [facility] ENDOSCOPY;  Service: General;  Laterality: N/A;   FIDUCIAL MARKER PLACEMENT  05/30/2022   Procedure: FIDUCIAL MARKER PLACEMENT;  Surgeon: Leslye Peer, MD;  Location: [facility] ENDOSCOPY;  Service: Pulmonary;;   IR ANGIO INTRA EXTRACRAN SEL COM CAROTID INNOMINATE UNI R MOD SED  03/02/2022   IR CT HEAD LTD  03/02/2022   IR PERCUTANEOUS ART THROMBECTOMY/INFUSION INTRACRANIAL INC DIAG ANGIO  03/02/2022   PEG PLACEMENT N/A 03/11/2022   Procedure: PERCUTANEOUS ENDOSCOPIC GASTROSTOMY (PEG) PLACEMENT;  Surgeon: Violeta Gelinas, MD;  Location: [facility] ENDOSCOPY;  Service: General;  Laterality: N/A;   RADIOLOGY WITH ANESTHESIA N/A 03/02/2022   Procedure: IR WITH ANESTHESIA;  Surgeon: Julieanne Cotton, MD;  Location: [facility] OR;  Service: Radiology;  Laterality: N/A;   VIDEO BRONCHOSCOPY WITH RADIAL ENDOBRONCHIAL ULTRASOUND  05/30/2022   Procedure: VIDEO BRONCHOSCOPY WITH RADIAL ENDOBRONCHIAL ULTRASOUND;  Surgeon: Leslye Peer, MD;  Location: [facility] ENDOSCOPY;  Service: Pulmonary;;     Home Medications:  Prior to Admission medications   Medication Sig Start Date End Date Taking? Authorizing Provider  apixaban (ELIQUIS) 5 MG TABS tablet TAKE 1 TABLET(5 MG) BY MOUTH TWICE DAILY Patient taking differently: Take 5 mg by mouth 2 (two) times daily. 04/11/23  Yes Merrilyn Puma, MD  atorvastatin (LIPITOR) 40 MG tablet Take 1 tablet (40 mg total) by mouth daily. 01/03/23 01/03/24 Yes Merrilyn Puma, MD  budesonide (PULMICORT) 0.5 MG/2ML nebulizer solution NEW PRESCRIPTION REQEUST: BUDESONIDE 0.5 MG/ - USE ONE VIAL TWICE DAILY Patient taking differently: Take 0.5 mg by nebulization in the morning and at bedtime. 11/29/22  Yes Martina Sinner, MD  levETIRAcetam (KEPPRA) 500 MG tablet Take 1 tablet (500 mg total) by mouth 2 (two) times daily. 12/07/22  Yes McCue, Shanda Bumps, NP  midodrine (PROAMATINE) 2.5 MG tablet Take 1 tablet  (2.5 mg total) by mouth 3 (three) times daily with meals. Patient taking differently: Take 2.5 mg by mouth in the morning and at bedtime. 01/06/23 07/05/23 Yes Merrilyn Puma, MD  Multiple Vitamin (MULTIVITAMIN WITH MINERALS) TABS tablet Take 1 tablet by mouth daily. Patient taking differently: Take 1 tablet by mouth in the morning and at bedtime. 09/20/22  Yes Doran Stabler, DO  ondansetron (ZOFRAN-ODT) 4 MG disintegrating tablet Take 1 tablet (4 mg total) by mouth every 8 (eight) hours as needed for up to 3 days for nausea or vomiting. 07/02/23 07/05/23 Yes Cardama, Amadeo Garnet, MD  oxyCODONE-acetaminophen (PERCOCET/ROXICET) 5-325 MG tablet Take 1 tablet by mouth every 6 (six) hours as needed for severe pain. Patient taking differently: Take 1 tablet by mouth as needed for severe pain. 06/03/23  Yes Prosperi, Christian H, PA-C  Tiotropium Bromide-Olodaterol (STIOLTO RESPIMAT) 2.5-2.5 MCG/ACT AERS NEW PRESCRIPTION REQUEST: STIOLTO 2.5 MCG- INHALE TWO PUFFS BY MOUTH DAILY Patient taking differently: Inhale 2 each into the  lungs daily. 11/29/22  Yes Martina Sinner, MD  albuterol (PROVENTIL) (2.5 MG/3ML) 0.083% nebulizer solution Take 3 mLs (2.5 mg total) by nebulization every 4 (four) hours as needed for wheezing or shortness of breath. Patient not taking: Reported on 04/13/2023 08/16/22   Steffanie Rainwater, MD  folic acid (FOLVITE) 1 MG tablet Take 1 tablet (1 mg total) by mouth daily. Patient not taking: Reported on 06/03/2023 03/21/23   Merrilyn Puma, MD  sucralfate (CARAFATE) 1 GM/10ML suspension Take 10 mLs (1 g total) by mouth 4 (four) times daily -  with meals and at bedtime. Patient not taking: Reported on 07/04/2023 07/02/23   Nira Conn, MD    Inpatient Medications: Scheduled Meds:  pantoprazole (PROTONIX) IV  40 mg Intravenous Q24H   Continuous Infusions:  0.9 % NaCl with KCl 20 mEq / L     [START ON 07/05/2023] cefTRIAXone (ROCEPHIN)  IV     metronidazole 500 mg (07/04/23  0753)   PRN Meds: acetaminophen **OR** acetaminophen, ondansetron **OR** ondansetron (ZOFRAN) IV, sodium phosphate  Allergies:   No Known Allergies  Social History:   Social History   Socioeconomic History   Marital status: Widowed    Spouse name: Not on file   Number of children: Not on file   Years of education: Not on file   Highest education level: Not on file  Occupational History   Not on file  Tobacco Use   Smoking status: Former    Current packs/day: 0.00    Types: Cigarettes    Quit date: 05/02/2022    Years since quitting: 1.1    Passive exposure: Never   Smokeless tobacco: Never  Vaping Use   Vaping status: Never Used  Substance and Sexual Activity   Alcohol use: Not Currently   Drug use: No   Sexual activity: Not on file  Other Topics Concern   Not on file  Social History Narrative   Not on file   Social Determinants of Health   Financial Resource Strain: Not on file  Food Insecurity: No Food Insecurity (04/14/2023)   Hunger Vital Sign    Worried About Running Out of Food in the Last Year: Never true    Ran Out of Food in the Last Year: Never true  Transportation Needs: No Transportation Needs (04/14/2023)   PRAPARE - Administrator, Civil Service (Medical): No    Lack of Transportation (Non-Medical): No  Physical Activity: Not on file  Stress: Not on file  Social Connections: Socially Isolated (01/06/2023)   Social Connection and Isolation Panel [NHANES]    Frequency of Communication with Friends and Family: Once a week    Frequency of Social Gatherings with Friends and Family: Once a week    Attends Religious Services: Never    Database administrator or Organizations: No    Attends Banker Meetings: Never    Marital Status: Widowed  Intimate Partner Violence: Not At Risk (04/14/2023)   Humiliation, Afraid, Rape, and Kick questionnaire    Fear of Current or Ex-Partner: No    Emotionally Abused: No    Physically Abused: No     Sexually Abused: No    Family History:    Family History  Problem Relation Age of Onset   Throat cancer Mother    Liver cancer Father    Kidney failure Sister    Cancer - Lung Paternal Uncle      ROS:  Please see the history of  present illness.  CV:see HPI PUL:see HPI MS:no joint pain, no claudication Neuro:no syncope, no lightheadedness, hx CVA with aphasia Rt side deficits that limites his overall ROS   Physical Exam/Data:   Vitals:   07/04/23 0630 07/04/23 0712 07/04/23 0756 07/04/23 0844  BP: 111/79   (!) 162/146  Pulse: 89   83  Resp:    20  Temp:  99.9 F (37.7 C) 100.2 F (37.9 C) 98.4 F (36.9 C)  TempSrc:  Oral  Oral  SpO2: 93%   95%  Weight:      Height:        Intake/Output Summary (Last 24 hours) at 07/04/2023 2536 Last data filed at 07/04/2023 0710 Gross per 24 hour  Intake 100 ml  Output --  Net 100 ml      07/04/2023   12:17 AM 07/02/2023   12:33 AM 06/19/2023    8:59 AM  Last 3 Weights  Weight (lbs) 165 lb 164 lb 176 lb 14.4 oz  Weight (kg) 74.844 kg 74.39 kg 80.241 kg     Body mass index is 22.38 kg/m.   Exam per Dr. Izora Ribas  Gen: no distress  Neck: No JVD Cardiac: No Rubs or Gallops, no murmur, regular rate+2 radial pulses Respiratory: Clear to auscultation bilaterally, normal effort, normal  respiratory rate GI: Soft, nontender with light palpitations, non-distended  MS: No  edema Psych: Normal affect  Able to answer yes/no questions.     Relevant CV Studies: Echo 10/13/22 1. Left ventricular ejection fraction, by estimation, is 45 to 50%. The  left ventricle has mildly decreased function. The left ventricle  demonstrates global hypokinesis. Left ventricular diastolic parameters are  indeterminate.   2. Right ventricular systolic function is normal. The right ventricular  size is normal. There is normal pulmonary artery systolic pressure.   3. The mitral valve is normal in structure. No evidence of mitral valve   regurgitation. No evidence of mitral stenosis.   4. The aortic valve is normal in structure. Aortic valve regurgitation is  not visualized. No aortic stenosis is present.   5. The inferior vena cava is normal in size with greater than 50%  respiratory variability, suggesting right atrial pressure of 3 mmHg.   FINDINGS   Left Ventricle: Left ventricular ejection fraction, by estimation, is 45  to 50%. The left ventricle has mildly decreased function. The left  ventricle demonstrates global hypokinesis. The left ventricular internal  cavity size was normal in size. There is   no left ventricular hypertrophy. Left ventricular diastolic parameters  are indeterminate.   Right Ventricle: The right ventricular size is normal. No increase in  right ventricular wall thickness. Right ventricular systolic function is  normal. There is normal pulmonary artery systolic pressure. The tricuspid  regurgitant velocity is 1.06 m/s, and   with an assumed right atrial pressure of 3 mmHg, the estimated right  ventricular systolic pressure is 7.5 mmHg.   Left Atrium: Left atrial size was normal in size.   Right Atrium: Right atrial size was normal in size.   Pericardium: There is no evidence of pericardial effusion.   Mitral Valve: The mitral valve is normal in structure. No evidence of  mitral valve regurgitation. No evidence of mitral valve stenosis.   Tricuspid Valve: The tricuspid valve is normal in structure. Tricuspid  valve regurgitation is not demonstrated. No evidence of tricuspid  stenosis.   Aortic Valve: The aortic valve is normal in structure. Aortic valve  regurgitation is not visualized.  No aortic stenosis is present. Aortic  valve mean gradient measures 2.0 mmHg. Aortic valve peak gradient measures  3.9 mmHg. Aortic valve area, by VTI  measures 3.28 cm.   Pulmonic Valve: The pulmonic valve was normal in structure. Pulmonic valve  regurgitation is not visualized. No evidence of  pulmonic stenosis.   Aorta: The aortic root is normal in size and structure.   Venous: The inferior vena cava is normal in size with greater than 50%  respiratory variability, suggesting right atrial pressure of 3 mmHg.   IAS/Shunts: No atrial level shunt detected by color flow Doppler.    Laboratory Data:  High Sensitivity Troponin:  No results for input(s): "TROPONINIHS" in the last 720 hours.   Chemistry Recent Labs  Lab 07/02/23 0043 07/04/23 0158  NA 139 138  K 3.6 3.6  CL 102 102  CO2 28 28  GLUCOSE 128* 116*  BUN 16 14  CREATININE 0.87 0.95  CALCIUM 9.0 9.3  GFRNONAA >60 >60  ANIONGAP 9 8    Recent Labs  Lab 07/02/23 0043 07/04/23 0158  PROT 7.7 8.5*  ALBUMIN 3.9 4.2  AST 17 19  ALT 13 15  ALKPHOS 90 92  BILITOT 0.5 1.7*   Lipids No results for input(s): "CHOL", "TRIG", "HDL", "LABVLDL", "LDLCALC", "CHOLHDL" in the last 168 hours.  Hematology Recent Labs  Lab 07/02/23 0043 07/04/23 0158  WBC 8.3 13.4*  RBC 4.59 4.73  HGB 13.4 14.1  HCT 42.6 44.3  MCV 92.8 93.7  MCH 29.2 29.8  MCHC 31.5 31.8  RDW 13.4 13.6  PLT 156 147*   Thyroid No results for input(s): "TSH", "FREET4" in the last 168 hours.  BNPNo results for input(s): "BNP", "PROBNP" in the last 168 hours.  DDimer No results for input(s): "DDIMER" in the last 168 hours.   Radiology/Studies:  US Abdomen Limited RUQ (LIVER/GB)  Result Date: 07/04/2023 CLINICAL DATA:  Right upper quadrant pain. EXAM: ULTRASOUND ABDOMEN LIMITED RIGHT UPPER QUADRANT COMPARISON:  None Available. FINDINGS: Gallbladder: Multiple gallstones evident measuring up to 11 mm. Gallbladder wall thickness varies from 3-5 mm thickness. Small volume pericholecystic fluid evident. Common bile duct: Diameter: Not well seen. Liver: Coarsening of hepatic echotexture evident without a discrete or focal mass lesion evident. Suspicion for trace intrahepatic biliary duct dilatation. Portal vein is patent on color Doppler imaging with  normal direction of blood flow towards the liver. Other: None. IMPRESSION: 1. Cholelithiasis with gallbladder wall thickening and small volume pericholecystic fluid. Imaging features are concerning for acute cholecystitis. 2. Suspicion for trace intrahepatic biliary duct dilatation. Common bile duct not well seen. Electronically Signed   By: Kennith Center M.D.   On: 07/04/2023 05:40   CT ABDOMEN PELVIS W CONTRAST  Result Date: 07/04/2023 CLINICAL DATA:  Bowel obstruction suspected. Ongoing vomiting. History of lung cancer. EXAM: CT ABDOMEN AND PELVIS WITH CONTRAST TECHNIQUE: Multidetector CT imaging of the abdomen and pelvis was performed using the standard protocol following bolus administration of intravenous contrast. RADIATION DOSE REDUCTION: This exam was performed according to the departmental dose-optimization program which includes automated exposure control, adjustment of the mA and/or kV according to patient size and/or use of iterative reconstruction technique. CONTRAST:  OMNIPAQUE IOHEXOL 300 MG/ML  SOLN COMPARISON:  04/08/2023 FINDINGS: Lower chest: Reticulation in the left lower lobe. No acute finding. Hepatobiliary: No focal liver abnormality.Edematous thickening of the gallbladder which is distended. The CBD measures up to 9 mm in diameter. No calcified gallstone. Pancreas: Generalized atrophy. Spleen: Unremarkable. Adrenals/Urinary Tract:  Negative adrenals. No hydronephrosis or stone. Unremarkable bladder. Stomach/Bowel:  No obstruction. No appendicitis. Vascular/Lymphatic: No acute vascular abnormality. Severe atheromatous plaque affecting the aorta and branch vessels with bilateral chronic SFA and left common iliac artery occlusions. Numerous bilateral inflow stenoses. No mass or adenopathy. Reproductive:No pathologic findings. Other: No ascites or pneumoperitoneum. Musculoskeletal: No acute abnormalities. IMPRESSION: 1. Gallbladder distension and edema as with acute cholecystitis. No  calcified gallstones, suggest ultrasound correlation. 2. Severe atherosclerosis with chronic occlusions of the left common iliac and bilateral SFA. Electronically Signed   By: Tiburcio Pea M.D.   On: 07/04/2023 04:04   CT Head Wo Contrast  Result Date: 07/04/2023 CLINICAL DATA:  Head trauma EXAM: CT HEAD WITHOUT CONTRAST CT CERVICAL SPINE WITHOUT CONTRAST TECHNIQUE: Multidetector CT imaging of the head and cervical spine was performed following the standard protocol without intravenous contrast. Multiplanar CT image reconstructions of the cervical spine were also generated. RADIATION DOSE REDUCTION: This exam was performed according to the departmental dose-optimization program which includes automated exposure control, adjustment of the mA and/or kV according to patient size and/or use of iterative reconstruction technique. 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 Ex vacuo dilatation of the left lateral ventricle. There is hypoattenuation of the periventricular white matter, most commonly indicating chronic ischemic microangiopathy. Vascular: No abnormal hyperdensity of the major intracranial arteries or dural venous sinuses. No intracranial atherosclerosis. Skull: The visualized skull base, calvarium and extracranial soft tissues are normal. Sinuses/Orbits: No fluid levels or advanced mucosal thickening of the visualized paranasal sinuses. No mastoid or middle ear effusion. The orbits are normal. CT CERVICAL SPINE FINDINGS Alignment: No static subluxation. Facets are aligned. Occipital condyles are normally positioned. Skull base and vertebrae: No acute fracture. Soft tissues and spinal canal: No prevertebral fluid or swelling. No visible canal hematoma. Disc levels: No advanced spinal canal or neural foraminal stenosis. Upper chest: No pneumothorax, pulmonary nodule or pleural effusion. Other:  Normal visualized paraspinal cervical soft tissues. IMPRESSION: 1. No acute fracture or static subluxation of the cervical spine. 2. 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 Electronically Signed   By: Deatra Robinson M.D.   On: 07/04/2023 03:25   CT Cervical Spine Wo Contrast  Result Date: 07/04/2023 CLINICAL DATA:  Head trauma EXAM: CT HEAD WITHOUT CONTRAST CT CERVICAL SPINE WITHOUT CONTRAST TECHNIQUE: Multidetector CT imaging of the head and cervical spine was performed following the standard protocol without intravenous contrast. Multiplanar CT image reconstructions of the cervical spine were also generated. RADIATION DOSE REDUCTION: This exam was performed according to the departmental dose-optimization program which includes automated exposure control, adjustment of the mA and/or kV according to patient size and/or use of iterative reconstruction technique. 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 Ex vacuo dilatation of the left lateral ventricle. There is hypoattenuation of the periventricular white matter, most commonly indicating chronic ischemic microangiopathy. Vascular: No abnormal hyperdensity of the major intracranial arteries or dural venous sinuses. No intracranial atherosclerosis. Skull: The visualized skull base, calvarium and extracranial soft tissues are normal. Sinuses/Orbits: No fluid levels or advanced mucosal thickening of the visualized paranasal sinuses. No mastoid or middle ear effusion. The orbits are normal. CT CERVICAL SPINE FINDINGS Alignment: No static subluxation. Facets are aligned. Occipital condyles are normally positioned. Skull base and vertebrae: No acute fracture. Soft tissues and spinal canal: No  prevertebral fluid or swelling. No visible canal hematoma. Disc levels: No advanced spinal canal or neural foraminal stenosis. Upper chest: No  pneumothorax, pulmonary nodule or pleural effusion. Other: Normal visualized paraspinal cervical soft tissues. IMPRESSION: 1. No acute fracture or static subluxation of the cervical spine. 2. 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 Electronically Signed   By: Deatra Robinson M.D.   On: 07/04/2023 03:25   DG Chest Port 1 View  Result Date: 07/04/2023 CLINICAL DATA:  Fever, cancer patient, vomiting EXAM: PORTABLE CHEST 1 VIEW COMPARISON:  Radiographs 06/11/2023 FINDINGS: No change from 06/11/2023. Unchanged dense bandlike consolidation in the peripheral right mid lung with biopsy clip. Bandlike fibrotic change in the mid left lung. No new focal consolidation, pleural effusion, or pneumothorax. No displaced rib fractures. Stable cardiomediastinal silhouette. Aortic atherosclerotic calcification. IMPRESSION: Unchanged consolidation/scarring in the bilateral mid lungs. Electronically Signed   By: Minerva Fester M.D.   On: 07/04/2023 01:25     Assessment and Plan:   Pre-op eval with pt with CAD by CTA of chest no angina.  He has many co morbidities and he is high risk from CAD and decreased EF though euvolemic along with his other high risk issues lung cancer, PE, COPD.  He is well compensated and we believe he could proceed with procedure. Echo pending.  Acute cholecystitis per surgery  Hx PE eliquis on hold per IM Hx of ischemic lt MCA CVA with aphasia and rt sided hemiparesis stable COPD per IM Mildly reduced EF and poor windows on prior echoes.  Currently euvolemic.  Chronic hypotension on midodrine per IM Hx of seizures per IM    Risk Assessment/Risk Scores:      Preoperative Risk Assessment - The Revised Cardiac Risk Index = 3 due to   Vascular disease, HF, CVA  - this which equates 11%; high  estimated risk of perioperative myocardial infarction, pulmonary edema, ventricular fibrillation, cardiac arrest, or complete heart block.            For questions or  updates, please contact [facility] Please consult [email or web address] for contact info under    Signed, Nada Boozer, NP  07/04/2023 9:22 AM   Personally seen and examined. Agree with APP above with the following comments:  Briefly 62 yo M with Stroke with residual aphasia and seizures, CAC, Aortic atherosclerosis, PAD, and HLD, Lung cancer and COPD and mixed obstructive lung disease. Hx of PE on eliquis, hx of hypotension requiring midodrine. Found to have acute cholecystitis.  Patient notes that he is not in any pain presently.  He is aware that he is planned for surgery.  No SOB, Palpitations, or syncope.   His aphasia is significant. Most of the history from HPI section (worsening lung cancer on palliative radiotherapy)  My exam is noted above.  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; he is difficult to image and his last study was done almost exclusively from the subcostal   Would recommend  - he is high cardiac risk in the setting of his multiple comorbidities - he is high general risk in the setting of his multiple comorbidities - he is well compensated; surgery is reasonable by increased risk, may be best served by least invasive strategy but will defer to surgery team -  he is limited in GDMT titration in the setting of hypotension (will not add BB for asymptomatic PVCs) - and echo is ordered and is pending, he is difficult to image (best imaged in the subcostal in the past, now presenting with cholecystitis)  we may not get diagnostic images on this study and this should not delay his procedural intervention - fasting lipids ordered for tomorrow AM for risk stratification  Riley Lam, MD FASE [facility] Cardi[facility]  [address] [place], [place] [address] [phone]  10:47 AM

## 2023-07-04 NOTE — Consult Note (Signed)
Consult Note  Todd Estrada 21-Oct-1961  [id number].    Requesting MD: Bobette Mo, MD Chief Complaint/Reason for Consult: Acute cholecystitis  HPI:  Patient is a 62 year old male who presented to the ED after a fall and n/v and fevers. He was initially seen 7/14 but discharged home and then returned when symptoms were worsening. Found to have likely acute cholecystitis. PMH significant for Hx of PE on Eliquis - last dose 7/15 AM, Hx of CVA with aphasia and R hemiparesis, Hx of HTN, Hypotension on midodrine TID, HLD, GERD, Seizure disease, Lung cancer and Peripheral vascular disease. Prior abdominal surgery includes PEG placement in 02/2022.   ROS: Negative other than HPI  Family History  Problem Relation Age of Onset   Throat cancer Mother    Liver cancer Father    Kidney failure Sister    Cancer - Lung Paternal Uncle     Past Medical History:  Diagnosis Date   Asthma    Atypical chest pain 08/13/2022   Community acquired pneumonia 09/14/2022   COPD (chronic obstructive pulmonary disease) (HCC)    Essential hypertension 08/19/2021   GERD (gastroesophageal reflux disease)    HAP (hospital-acquired pneumonia) 09/16/2022   History of tracheostomy    03/09/22-04/11/22   HLD (hyperlipidemia)    Hypertension    Hypokalemia 08/13/2022   Hypomagnesemia 08/13/2022   Lung cancer (HCC)    PAD (peripheral artery disease) (HCC)    Seizures (HCC) 06/02/2022   Sepsis (HCC) 08/13/2022   Sepsis due to pneumonia (HCC) 04/13/2023   Stroke (HCC) 02/2022    Past Surgical History:  Procedure Laterality Date   BRONCHIAL BIOPSY  05/30/2022   Procedure: BRONCHIAL BIOPSIES;  Surgeon: Leslye Peer, MD;  Location: [facility] ENDOSCOPY;  Service: Pulmonary;;   BRONCHIAL BRUSHINGS  05/30/2022   Procedure: BRONCHIAL BRUSHINGS;  Surgeon: Leslye Peer, MD;  Location: [facility] ENDOSCOPY;  Service: Pulmonary;;   BRONCHIAL NEEDLE ASPIRATION BIOPSY  05/30/2022   Procedure: BRONCHIAL NEEDLE  ASPIRATION BIOPSIES;  Surgeon: Leslye Peer, MD;  Location: [facility] ENDOSCOPY;  Service: Pulmonary;;   BRONCHIAL WASHINGS  05/30/2022   Procedure: BRONCHIAL WASHINGS;  Surgeon: Leslye Peer, MD;  Location: [facility] ENDOSCOPY;  Service: Pulmonary;;   ESOPHAGOGASTRODUODENOSCOPY (EGD) WITH PROPOFOL N/A 03/11/2022   Procedure: ESOPHAGOGASTRODUODENOSCOPY (EGD) WITH PROPOFOL;  Surgeon: Violeta Gelinas, MD;  Location: [facility] ENDOSCOPY;  Service: General;  Laterality: N/A;   FIDUCIAL MARKER PLACEMENT  05/30/2022   Procedure: FIDUCIAL MARKER PLACEMENT;  Surgeon: Leslye Peer, MD;  Location: [facility] ENDOSCOPY;  Service: Pulmonary;;   IR ANGIO INTRA EXTRACRAN SEL COM CAROTID INNOMINATE UNI R MOD SED  03/02/2022   IR CT HEAD LTD  03/02/2022   IR PERCUTANEOUS ART THROMBECTOMY/INFUSION INTRACRANIAL INC DIAG ANGIO  03/02/2022   PEG PLACEMENT N/A 03/11/2022   Procedure: PERCUTANEOUS ENDOSCOPIC GASTROSTOMY (PEG) PLACEMENT;  Surgeon: Violeta Gelinas, MD;  Location: [facility] ENDOSCOPY;  Service: General;  Laterality: N/A;   RADIOLOGY WITH ANESTHESIA N/A 03/02/2022   Procedure: IR WITH ANESTHESIA;  Surgeon: Julieanne Cotton, MD;  Location: [facility] OR;  Service: Radiology;  Laterality: N/A;   VIDEO BRONCHOSCOPY WITH RADIAL ENDOBRONCHIAL ULTRASOUND  05/30/2022   Procedure: VIDEO BRONCHOSCOPY WITH RADIAL ENDOBRONCHIAL ULTRASOUND;  Surgeon: Leslye Peer, MD;  Location: [facility] ENDOSCOPY;  Service: Pulmonary;;    Social History:  reports that he quit smoking about 14 months ago. His smoking use included cigarettes. He has never been exposed to tobacco smoke. He has never used smokeless tobacco.  He reports that he does not currently use alcohol. He reports that he does not use drugs.  Allergies: No Known Allergies  Medications Prior to Admission  Medication Sig Dispense Refill   apixaban (ELIQUIS) 5 MG TABS tablet TAKE 1 TABLET(5 MG) BY MOUTH TWICE DAILY (Patient taking differently: Take 5 mg by mouth 2 (two) times daily.) 180 tablet 2    atorvastatin (LIPITOR) 40 MG tablet Take 1 tablet (40 mg total) by mouth daily. 90 tablet 3   budesonide (PULMICORT) 0.5 MG/2ML nebulizer solution NEW PRESCRIPTION REQEUST: BUDESONIDE 0.5 MG/ - USE ONE VIAL TWICE DAILY (Patient taking differently: Take 0.5 mg by nebulization in the morning and at bedtime.) 180 mL 3   levETIRAcetam (KEPPRA) 500 MG tablet Take 1 tablet (500 mg total) by mouth 2 (two) times daily. 180 tablet 3   midodrine (PROAMATINE) 2.5 MG tablet Take 1 tablet (2.5 mg total) by mouth 3 (three) times daily with meals. (Patient taking differently: Take 2.5 mg by mouth in the morning and at bedtime.) 90 tablet 5   Multiple Vitamin (MULTIVITAMIN WITH MINERALS) TABS tablet Take 1 tablet by mouth daily. (Patient taking differently: Take 1 tablet by mouth in the morning and at bedtime.) 30 tablet 2   ondansetron (ZOFRAN-ODT) 4 MG disintegrating tablet Take 1 tablet (4 mg total) by mouth every 8 (eight) hours as needed for up to 3 days for nausea or vomiting. 15 tablet 0   oxyCODONE-acetaminophen (PERCOCET/ROXICET) 5-325 MG tablet Take 1 tablet by mouth every 6 (six) hours as needed for severe pain. (Patient taking differently: Take 1 tablet by mouth as needed for severe pain.) 12 tablet 0   Tiotropium Bromide-Olodaterol (STIOLTO RESPIMAT) 2.5-2.5 MCG/ACT AERS NEW PRESCRIPTION REQUEST: STIOLTO 2.5 MCG- INHALE TWO PUFFS BY MOUTH DAILY (Patient taking differently: Inhale 2 each into the lungs daily.) 12 g 3   albuterol (PROVENTIL) (2.5 MG/3ML) 0.083% nebulizer solution Take 3 mLs (2.5 mg total) by nebulization every 4 (four) hours as needed for wheezing or shortness of breath. (Patient not taking: Reported on 04/13/2023) 90 mL 12   folic acid (FOLVITE) 1 MG tablet Take 1 tablet (1 mg total) by mouth daily. (Patient not taking: Reported on 06/03/2023) 30 tablet 2   sucralfate (CARAFATE) 1 GM/10ML suspension Take 10 mLs (1 g total) by mouth 4 (four) times daily -  with meals and at bedtime. (Patient  not taking: Reported on 07/04/2023) 420 mL 0    Blood pressure (!) 162/146, pulse 83, temperature 98.4 F (36.9 C), temperature source Oral, resp. rate 20, height 6' (1.829 m), weight 74.8 kg, SpO2 95%. Physical Exam:  General: pleasant, WD, chronically ill appearing male who is laying in bed in NAD HEENT: sclera anicteric Heart: regular, rate, and rhythm.  Palpable radial and pedal pulses bilaterally Lungs:  Respiratory effort nonlabored Abd: soft, TTP in RUQ with positive Murphy sign, ND, no masses, hernias, or organomegaly MS: all 4 extremities are symmetrical with no cyanosis, clubbing, or edema. Skin: warm and dry with no masses, lesions, or rashes   Results for orders placed or performed during the hospital encounter of 07/04/23 (from the past 48 hour(s))  CBC with Differential     Status: Abnormal   Collection Time: 07/04/23  1:58 AM  Result Value Ref Range   WBC 13.4 (H) 4.0 - 10.5 K/uL   RBC 4.73 4.22 - 5.81 MIL/uL   Hemoglobin 14.1 13.0 - 17.0 g/dL   HCT 01.0 27.2 - 53.6 %   MCV 93.7 80.0 -  100.0 fL   MCH 29.8 26.0 - 34.0 pg   MCHC 31.8 30.0 - 36.0 g/dL   RDW 16.1 09.6 - 04.5 %   Platelets 147 (L) 150 - 400 K/uL   nRBC 0.0 0.0 - 0.2 %   Neutrophils Relative % 85 %   Neutro Abs 11.3 (H) 1.7 - 7.7 K/uL   Lymphocytes Relative 7 %   Lymphs Abs 0.9 0.7 - 4.0 K/uL   Monocytes Relative 8 %   Monocytes Absolute 1.1 (H) 0.1 - 1.0 K/uL   Eosinophils Relative 0 %   Eosinophils Absolute 0.0 0.0 - 0.5 K/uL   Basophils Relative 0 %   Basophils Absolute 0.0 0.0 - 0.1 K/uL   Immature Granulocytes 0 %   Abs Immature Granulocytes 0.04 0.00 - 0.07 K/uL    Comment: Performed at [facility], [address] [place], [place] [address]  Comprehensive metabolic panel     Status: Abnormal   Collection Time: 07/04/23  1:58 AM  Result Value Ref Range   Sodium 138 135 - 145 mmol/L   Potassium 3.6 3.5 - 5.1 mmol/L   Chloride 102 98 - 111 mmol/L   CO2 28 22 - 32 mmol/L    Glucose, Bld 116 (H) 70 - 99 mg/dL    Comment: Glucose reference range applies only to samples taken after fasting for at least 8 hours.   BUN 14 8 - 23 mg/dL   Creatinine, Ser 1.91 0.61 - 1.24 mg/dL   Calcium 9.3 8.9 - 47.8 mg/dL   Total Protein 8.5 (H) 6.5 - 8.1 g/dL   Albumin 4.2 3.5 - 5.0 g/dL   AST 19 15 - 41 U/L   ALT 15 0 - 44 U/L   Alkaline Phosphatase 92 38 - 126 U/L   Total Bilirubin 1.7 (H) 0.3 - 1.2 mg/dL   GFR, Estimated >29 >56 mL/min    Comment: (NOTE) Calculated using the CKD-EPI Creatinine Equation (2021)    Anion gap 8 5 - 15    Comment: Performed at [facility], [address], [place], [place] [address]  Lactic acid, plasma     Status: None   Collection Time: 07/04/23  1:58 AM  Result Value Ref Range   Lactic Acid, Venous 1.3 0.5 - 1.9 mmol/L    Comment: Performed at [facility], [address] [place], [place] [address]  Blood culture (routine x 2)     Status: None (Preliminary result)   Collection Time: 07/04/23  1:58 AM   Specimen: BLOOD  Result Value Ref Range   Specimen Description      BLOOD BLOOD LEFT ARM Performed at [facility], [address] [place], [place] [address]    Special Requests      BOTTLES DRAWN AEROBIC AND ANAEROBIC Blood Culture adequate volume Performed at [facility], [address], [place], [place] [address]    Culture      NO GROWTH < 12 HOURS Performed at [facility], [address] [place], [place] [address]    Report Status PENDING   Blood culture (routine x 2)     Status: None (Preliminary result)   Collection Time: 07/04/23  1:58 AM   Specimen: BLOOD  Result Value Ref Range   Specimen Description      BLOOD BLOOD RIGHT ARM Performed at [facility], [address], [place], [place] [address]    Special Requests  BOTTLES DRAWN AEROBIC AND ANAEROBIC Blood Culture results may not be optimal due to an  excessive volume of blood received in culture bottles Performed at [facility], [address] [place], [place] [address]    Culture      NO GROWTH < 12 HOURS Performed at [facility], [address], [place], [place] [address]    Report Status PENDING   Lipase, blood     Status: None   Collection Time: 07/04/23  1:58 AM  Result Value Ref Range   Lipase 23 11 - 51 U/L    Comment: Performed at [facility], [address], [place], [place] [address]  Resp panel by RT-PCR (RSV, Flu A&B, Covid) Peripheral     Status: None   Collection Time: 07/04/23  1:58 AM   Specimen: Peripheral; Nasal Swab  Result Value Ref Range   SARS Coronavirus 2 by RT PCR NEGATIVE NEGATIVE    Comment: (NOTE) SARS-CoV-2 target nucleic acids are NOT DETECTED.  The SARS-CoV-2 RNA is generally detectable in upper respiratory specimens during the acute phase of infection. The lowest concentration of SARS-CoV-2 viral copies this assay can detect is 138 copies/mL. A negative result does not preclude SARS-Cov-2 infection and should not be used as the sole basis for treatment or other patient management decisions. A negative result may occur with  improper specimen collection/handling, submission of specimen other than nasopharyngeal swab, presence of viral mutation(s) within the areas targeted by this assay, and inadequate number of viral copies(<138 copies/mL). A negative result must be combined with clinical observations, patient history, and epidemiological information. The expected result is Negative.  Fact Sheet for Patients:  BloggerCourse.com  Fact Sheet for Healthcare Providers:  SeriousBroker.it  This test is no t yet approved or cleared by the [place] FDA and  has been authorized for detection and/or diagnosis of SARS-CoV-2 by FDA under an Emergency Use Authorization (EUA). This EUA will remain  in  effect (meaning this test can be used) for the duration of the COVID-19 declaration under Section 564(b)(1) of the Act, 21 U.S.C.section 360bbb-3(b)(1), unless the authorization is terminated  or revoked sooner.       Influenza A by PCR NEGATIVE NEGATIVE   Influenza B by PCR NEGATIVE NEGATIVE    Comment: (NOTE) The Xpert Xpress SARS-CoV-2/FLU/RSV plus assay is intended as an aid in the diagnosis of influenza from Nasopharyngeal swab specimens and should not be used as a sole basis for treatment. Nasal washings and aspirates are unacceptable for Xpert Xpress SARS-CoV-2/FLU/RSV testing.  Fact Sheet for Patients: BloggerCourse.com  Fact Sheet for Healthcare Providers: SeriousBroker.it  This test is not yet approved or cleared by the [place] FDA and has been authorized for detection and/or diagnosis of SARS-CoV-2 by FDA under an Emergency Use Authorization (EUA). This EUA will remain in effect (meaning this test can be used) for the duration of the COVID-19 declaration under Section 564(b)(1) of the Act, 21 U.S.C. section 360bbb-3(b)(1), unless the authorization is terminated or revoked.     Resp Syncytial Virus by PCR NEGATIVE NEGATIVE    Comment: (NOTE) Fact Sheet for Patients: BloggerCourse.com  Fact Sheet for Healthcare Providers: SeriousBroker.it  This test is not yet approved or cleared by the [place] FDA and has been authorized for detection and/or diagnosis of SARS-CoV-2 by FDA under an Emergency Use Authorization (EUA). This EUA will remain in effect (meaning this test can be used) for the duration of the COVID-19 declaration under  Section 564(b)(1) of the Act, 21 U.S.C. section 360bbb-3(b)(1), unless the authorization is terminated or revoked.  Performed at [facility], [address], [place], [place] [address]   Urinalysis, w/  Reflex to Culture (Infection Suspected) -Urine, Clean Catch     Status: Abnormal   Collection Time: 07/04/23  7:35 AM  Result Value Ref Range   Specimen Source URINE, CLEAN CATCH    Color, Urine YELLOW YELLOW   APPearance CLEAR CLEAR   Specific Gravity, Urine >1.046 (H) 1.005 - 1.030   pH 6.0 5.0 - 8.0   Glucose, UA NEGATIVE NEGATIVE mg/dL   Hgb urine dipstick NEGATIVE NEGATIVE   Bilirubin Urine NEGATIVE NEGATIVE   Ketones, ur NEGATIVE NEGATIVE mg/dL   Protein, ur 30 (A) NEGATIVE mg/dL   Nitrite NEGATIVE NEGATIVE   Leukocytes,Ua NEGATIVE NEGATIVE   RBC / HPF 0-5 0 - 5 RBC/hpf   WBC, UA 0-5 0 - 5 WBC/hpf    Comment:        Reflex urine culture not performed if WBC <=10, OR if Squamous epithelial cells >5. If Squamous epithelial cells >5 suggest recollection.    Bacteria, UA RARE (A) NONE SEEN   Squamous Epithelial / HPF 0-5 0 - 5 /HPF   Mucus PRESENT     Comment: Performed at [facility], [address] [place], [place] [address]  Protime-INR     Status: Abnormal   Collection Time: 07/04/23  8:50 AM  Result Value Ref Range   Prothrombin Time 17.9 (H) 11.4 - 15.2 seconds   INR 1.5 (H) 0.8 - 1.2    Comment: (NOTE) INR goal varies based on device and disease states. Performed at [facility], [address] [place], [place] [address]   APTT     Status: None   Collection Time: 07/04/23  8:50 AM  Result Value Ref Range   aPTT 25 24 - 36 seconds    Comment: Performed at [facility], [address] [place], [place] [address]  Magnesium     Status: None   Collection Time: 07/04/23  8:50 AM  Result Value Ref Range   Magnesium 1.8 1.7 - 2.4 mg/dL    Comment: Performed at [facility], [address], [place], [place] [address]  Phosphorus     Status: None   Collection Time: 07/04/23  8:50 AM  Result Value Ref Range   Phosphorus 3.1 2.5 - 4.6 mg/dL    Comment: Performed at [facility], [address], [place], [place] [address]   US Abdomen Limited RUQ (LIVER/GB)  Result Date: 07/04/2023 CLINICAL DATA:  Right upper quadrant pain. EXAM: ULTRASOUND ABDOMEN LIMITED RIGHT UPPER QUADRANT COMPARISON:  None Available. FINDINGS: Gallbladder: Multiple gallstones evident measuring up to 11 mm. Gallbladder wall thickness varies from 3-5 mm thickness. Small volume pericholecystic fluid evident. Common bile duct: Diameter: Not well seen. Liver: Coarsening of hepatic echotexture evident without a discrete or focal mass lesion evident. Suspicion for trace intrahepatic biliary duct dilatation. Portal vein is patent on color Doppler imaging with normal direction of blood flow towards the liver. Other: None. IMPRESSION: 1. Cholelithiasis with gallbladder wall thickening and small volume pericholecystic fluid. Imaging features are concerning for acute cholecystitis. 2. Suspicion for trace intrahepatic biliary duct dilatation. Common bile duct not well seen. Electronically Signed   By: Kennith Center M.D.   On: 07/04/2023 05:40   CT ABDOMEN PELVIS W CONTRAST  Result Date: 07/04/2023 CLINICAL  DATA:  Bowel obstruction suspected. Ongoing vomiting. History of lung cancer. EXAM: CT ABDOMEN AND PELVIS WITH CONTRAST TECHNIQUE: Multidetector CT imaging of the abdomen and pelvis was performed using the standard protocol following bolus administration of intravenous contrast. RADIATION DOSE REDUCTION: This exam was performed according to the departmental dose-optimization program which includes automated exposure control, adjustment of the mA and/or kV according to patient size and/or use of iterative reconstruction technique. CONTRAST:  OMNIPAQUE IOHEXOL 300 MG/ML  SOLN COMPARISON:  04/08/2023 FINDINGS: Lower chest: Reticulation in the left lower lobe. No acute finding. Hepatobiliary: No focal liver abnormality.Edematous thickening of the gallbladder which is distended. The CBD measures up to 9 mm in  diameter. No calcified gallstone. Pancreas: Generalized atrophy. Spleen: Unremarkable. Adrenals/Urinary Tract: Negative adrenals. No hydronephrosis or stone. Unremarkable bladder. Stomach/Bowel:  No obstruction. No appendicitis. Vascular/Lymphatic: No acute vascular abnormality. Severe atheromatous plaque affecting the aorta and branch vessels with bilateral chronic SFA and left common iliac artery occlusions. Numerous bilateral inflow stenoses. No mass or adenopathy. Reproductive:No pathologic findings. Other: No ascites or pneumoperitoneum. Musculoskeletal: No acute abnormalities. IMPRESSION: 1. Gallbladder distension and edema as with acute cholecystitis. No calcified gallstones, suggest ultrasound correlation. 2. Severe atherosclerosis with chronic occlusions of the left common iliac and bilateral SFA. Electronically Signed   By: Tiburcio Pea M.D.   On: 07/04/2023 04:04   CT Head Wo Contrast  Result Date: 07/04/2023 CLINICAL DATA:  Head trauma EXAM: CT HEAD WITHOUT CONTRAST CT CERVICAL SPINE WITHOUT CONTRAST TECHNIQUE: Multidetector CT imaging of the head and cervical spine was performed following the standard protocol without intravenous contrast. Multiplanar CT image reconstructions of the cervical spine were also generated. RADIATION DOSE REDUCTION: This exam was performed according to the departmental dose-optimization program which includes automated exposure control, adjustment of the mA and/or kV according to patient size and/or use of iterative reconstruction technique. 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 Ex vacuo dilatation of the left lateral ventricle. There is hypoattenuation of the periventricular white matter, most commonly indicating chronic ischemic microangiopathy. Vascular: No abnormal hyperdensity of the major intracranial arteries or dural venous sinuses. No intracranial  atherosclerosis. Skull: The visualized skull base, calvarium and extracranial soft tissues are normal. Sinuses/Orbits: No fluid levels or advanced mucosal thickening of the visualized paranasal sinuses. No mastoid or middle ear effusion. The orbits are normal. CT CERVICAL SPINE FINDINGS Alignment: No static subluxation. Facets are aligned. Occipital condyles are normally positioned. Skull base and vertebrae: No acute fracture. Soft tissues and spinal canal: No prevertebral fluid or swelling. No visible canal hematoma. Disc levels: No advanced spinal canal or neural foraminal stenosis. Upper chest: No pneumothorax, pulmonary nodule or pleural effusion. Other: Normal visualized paraspinal cervical soft tissues. IMPRESSION: 1. No acute fracture or static subluxation of the cervical spine. 2. 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 Electronically Signed   By: Deatra Robinson M.D.   On: 07/04/2023 03:25   CT Cervical Spine Wo Contrast  Result Date: 07/04/2023 CLINICAL DATA:  Head trauma EXAM: CT HEAD WITHOUT CONTRAST CT CERVICAL SPINE WITHOUT CONTRAST TECHNIQUE: Multidetector CT imaging of the head and cervical spine was performed following the standard protocol without intravenous contrast. Multiplanar CT image reconstructions of the cervical spine were also generated. RADIATION DOSE REDUCTION: This exam was performed according to the departmental dose-optimization program which includes automated exposure control, adjustment of the mA and/or kV according to patient size and/or  use of iterative reconstruction technique. 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 Ex vacuo dilatation of the left lateral ventricle. There is hypoattenuation of the periventricular white matter, most commonly indicating chronic ischemic microangiopathy. Vascular: No abnormal hyperdensity of the major  intracranial arteries or dural venous sinuses. No intracranial atherosclerosis. Skull: The visualized skull base, calvarium and extracranial soft tissues are normal. Sinuses/Orbits: No fluid levels or advanced mucosal thickening of the visualized paranasal sinuses. No mastoid or middle ear effusion. The orbits are normal. CT CERVICAL SPINE FINDINGS Alignment: No static subluxation. Facets are aligned. Occipital condyles are normally positioned. Skull base and vertebrae: No acute fracture. Soft tissues and spinal canal: No prevertebral fluid or swelling. No visible canal hematoma. Disc levels: No advanced spinal canal or neural foraminal stenosis. Upper chest: No pneumothorax, pulmonary nodule or pleural effusion. Other: Normal visualized paraspinal cervical soft tissues. IMPRESSION: 1. No acute fracture or static subluxation of the cervical spine. 2. 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 Electronically Signed   By: Deatra Robinson M.D.   On: 07/04/2023 03:25   DG Chest Port 1 View  Result Date: 07/04/2023 CLINICAL DATA:  Fever, cancer patient, vomiting EXAM: PORTABLE CHEST 1 VIEW COMPARISON:  Radiographs 06/11/2023 FINDINGS: No change from 06/11/2023. Unchanged dense bandlike consolidation in the peripheral right mid lung with biopsy clip. Bandlike fibrotic change in the mid left lung. No new focal consolidation, pleural effusion, or pneumothorax. No displaced rib fractures. Stable cardiomediastinal silhouette. Aortic atherosclerotic calcification. IMPRESSION: Unchanged consolidation/scarring in the bilateral mid lungs. Electronically Signed   By: Minerva Fester M.D.   On: 07/04/2023 01:25      Assessment/Plan Acute cholecystitis  - CT AP with gallbladder wall thickening  - RUQ [place] with cholelithiasis and gallbladder wall thickening - WBC 13.4, low grade fever, significantly ttp in RUQ with positive Murphy sign on exam  - hold eliquis and get cardiac clearance for surgery but  patient would benefit from cholecystectomy  - possibly tomorrow, will need to discuss with family as well for consent   FEN: ok to have CLD from surgery standpoint and will make NPO after MN, IVF @100cc /h per [facility] VTE: hold eliquis, ok for heparin gtt if needed  ID: [email or web address]  - per [facility] -  Hx of PE on Eliquis - last dose 7/15 AM Hx of CVA with aphasia and R hemiparesis  Hx of HTN Hypotension on midodrine TID HLD GERD Seizure disease Lung cancer Peripheral vascular disease  I reviewed ED provider notes, hospitalist notes, last 24 h vitals and pain scores, last 48 h intake and output, last 24 h labs and trends, and last 24 h imaging results.   Juliet Rude, [facility] 07/04/2023, 10:46 AM Please see [id number] for [email or web address] number during day hours 7:00am-4:30pm

## 2023-07-04 NOTE — H&P (View-Only) (Signed)
Consult Note  TRACER GUTRIDGE 1961/02/08  [id number].    Requesting MD: Bobette Mo, MD Chief Complaint/Reason for Consult: Acute cholecystitis  HPI:  Patient is a 62 year old male who presented to the ED after a fall and n/v and fevers. He was initially seen 7/14 but discharged home and then returned when symptoms were worsening. Found to have likely acute cholecystitis. PMH significant for Hx of PE on Eliquis - last dose 7/15 AM, Hx of CVA with aphasia and R hemiparesis, Hx of HTN, Hypotension on midodrine TID, HLD, GERD, Seizure disease, Lung cancer and Peripheral vascular disease. Prior abdominal surgery includes PEG placement in 02/2022.   ROS: Negative other than HPI  Family History  Problem Relation Age of Onset   Throat cancer Mother    Liver cancer Father    Kidney failure Sister    Cancer - Lung Paternal Uncle     Past Medical History:  Diagnosis Date   Asthma    Atypical chest pain 08/13/2022   Community acquired pneumonia 09/14/2022   COPD (chronic obstructive pulmonary disease) (HCC)    Essential hypertension 08/19/2021   GERD (gastroesophageal reflux disease)    HAP (hospital-acquired pneumonia) 09/16/2022   History of tracheostomy    03/09/22-04/11/22   HLD (hyperlipidemia)    Hypertension    Hypokalemia 08/13/2022   Hypomagnesemia 08/13/2022   Lung cancer (HCC)    PAD (peripheral artery disease) (HCC)    Seizures (HCC) 06/02/2022   Sepsis (HCC) 08/13/2022   Sepsis due to pneumonia (HCC) 04/13/2023   Stroke (HCC) 02/2022    Past Surgical History:  Procedure Laterality Date   BRONCHIAL BIOPSY  05/30/2022   Procedure: BRONCHIAL BIOPSIES;  Surgeon: Leslye Peer, MD;  Location: [facility] ENDOSCOPY;  Service: Pulmonary;;   BRONCHIAL BRUSHINGS  05/30/2022   Procedure: BRONCHIAL BRUSHINGS;  Surgeon: Leslye Peer, MD;  Location: [facility] ENDOSCOPY;  Service: Pulmonary;;   BRONCHIAL NEEDLE ASPIRATION BIOPSY  05/30/2022   Procedure: BRONCHIAL NEEDLE  ASPIRATION BIOPSIES;  Surgeon: Leslye Peer, MD;  Location: [facility] ENDOSCOPY;  Service: Pulmonary;;   BRONCHIAL WASHINGS  05/30/2022   Procedure: BRONCHIAL WASHINGS;  Surgeon: Leslye Peer, MD;  Location: [facility] ENDOSCOPY;  Service: Pulmonary;;   ESOPHAGOGASTRODUODENOSCOPY (EGD) WITH PROPOFOL N/A 03/11/2022   Procedure: ESOPHAGOGASTRODUODENOSCOPY (EGD) WITH PROPOFOL;  Surgeon: Violeta Gelinas, MD;  Location[facility] ENDOSCOPY;  Service: General;  Laterality: N/A;   FIDUCIAL MARKER PLACEMENT  05/30/2022   Procedure: FIDUCIAL MARKER PLACEMENT;  Surgeon: Leslye Peer, MD;  Location: [facility] ENDOSCOPY;  Service: Pulmonary;;   IR ANGIO INTRA EXTRACRAN SEL COM CAROTID INNOMINATE UNI R MOD SED  03/02/2022   IR CT HEAD LTD  03/02/2022   IR PERCUTANEOUS ART THROMBECTOMY/INFUSION INTRACRANIAL INC DIAG ANGIO  03/02/2022   PEG PLACEMENT N/A 03/11/2022   Procedure: PERCUTANEOUS ENDOSCOPIC GASTROSTOMY (PEG) PLACEMENT;  Surgeon: Violeta Gelinas, MD;  Location: [facility] ENDOSCOPY;  Service: General;  Laterality: N/A;   RADIOLOGY WITH ANESTHESIA N/A 03/02/2022   Procedure: IR WITH ANESTHESIA;  Surgeon: Julieanne Cotton, MD;  Location: [facility] OR;  Service: Radiology;  Laterality: N/A;   VIDEO BRONCHOSCOPY WITH RADIAL ENDOBRONCHIAL ULTRASOUND  05/30/2022   Procedure: VIDEO BRONCHOSCOPY WITH RADIAL ENDOBRONCHIAL ULTRASOUND;  Surgeon: Leslye Peer, MD;  Location: [facility] ENDOSCOPY;  Service: Pulmonary;;    Social History:  reports that he quit smoking about 14 months ago. His smoking use included cigarettes. He has never been exposed to tobacco smoke. He has never used smokeless tobacco.  He reports that he does not currently use alcohol. He reports that he does not use drugs.  Allergies: No Known Allergies  Medications Prior to Admission  Medication Sig Dispense Refill   apixaban (ELIQUIS) 5 MG TABS tablet TAKE 1 TABLET(5 MG) BY MOUTH TWICE DAILY (Patient taking differently: Take 5 mg by mouth 2 (two) times daily.) 180 tablet 2    atorvastatin (LIPITOR) 40 MG tablet Take 1 tablet (40 mg total) by mouth daily. 90 tablet 3   budesonide (PULMICORT) 0.5 MG/2ML nebulizer solution NEW PRESCRIPTION REQEUST: BUDESONIDE 0.5 MG/ - USE ONE VIAL TWICE DAILY (Patient taking differently: Take 0.5 mg by nebulization in the morning and at bedtime.) 180 mL 3   levETIRAcetam (KEPPRA) 500 MG tablet Take 1 tablet (500 mg total) by mouth 2 (two) times daily. 180 tablet 3   midodrine (PROAMATINE) 2.5 MG tablet Take 1 tablet (2.5 mg total) by mouth 3 (three) times daily with meals. (Patient taking differently: Take 2.5 mg by mouth in the morning and at bedtime.) 90 tablet 5   Multiple Vitamin (MULTIVITAMIN WITH MINERALS) TABS tablet Take 1 tablet by mouth daily. (Patient taking differently: Take 1 tablet by mouth in the morning and at bedtime.) 30 tablet 2   ondansetron (ZOFRAN-ODT) 4 MG disintegrating tablet Take 1 tablet (4 mg total) by mouth every 8 (eight) hours as needed for up to 3 days for nausea or vomiting. 15 tablet 0   oxyCODONE-acetaminophen (PERCOCET/ROXICET) 5-325 MG tablet Take 1 tablet by mouth every 6 (six) hours as needed for severe pain. (Patient taking differently: Take 1 tablet by mouth as needed for severe pain.) 12 tablet 0   Tiotropium Bromide-Olodaterol (STIOLTO RESPIMAT) 2.5-2.5 MCG/ACT AERS NEW PRESCRIPTION REQUEST: STIOLTO 2.5 MCG- INHALE TWO PUFFS BY MOUTH DAILY (Patient taking differently: Inhale 2 each into the lungs daily.) 12 g 3   albuterol (PROVENTIL) (2.5 MG/3ML) 0.083% nebulizer solution Take 3 mLs (2.5 mg total) by nebulization every 4 (four) hours as needed for wheezing or shortness of breath. (Patient not taking: Reported on 04/13/2023) 90 mL 12   folic acid (FOLVITE) 1 MG tablet Take 1 tablet (1 mg total) by mouth daily. (Patient not taking: Reported on 06/03/2023) 30 tablet 2   sucralfate (CARAFATE) 1 GM/10ML suspension Take 10 mLs (1 g total) by mouth 4 (four) times daily -  with meals and at bedtime. (Patient  not taking: Reported on 07/04/2023) 420 mL 0    Blood pressure (!) 162/146, pulse 83, temperature 98.4 F (36.9 C), temperature source Oral, resp. rate 20, height 6' (1.829 m), weight 74.8 kg, SpO2 95%. Physical Exam:  General: pleasant, WD, chronically ill appearing male who is laying in bed in NAD HEENT: sclera anicteric Heart: regular, rate, and rhythm.  Palpable radial and pedal pulses bilaterally Lungs:  Respiratory effort nonlabored Abd: soft, TTP in RUQ with positive Murphy sign, ND, no masses, hernias, or organomegaly MS: all 4 extremities are symmetrical with no cyanosis, clubbing, or edema. Skin: warm and dry with no masses, lesions, or rashes   Results for orders placed or performed during the hospital encounter of 07/04/23 (from the past 48 hour(s))  CBC with Differential     Status: Abnormal   Collection Time: 07/04/23  1:58 AM  Result Value Ref Range   WBC 13.4 (H) 4.0 - 10.5 K/uL   RBC 4.73 4.22 - 5.81 MIL/uL   Hemoglobin 14.1 13.0 - 17.0 g/dL   HCT 01.0 27.2 - 53.6 %   MCV 93.7 80.0 -  100.0 fL   MCH 29.8 26.0 - 34.0 pg   MCHC 31.8 30.0 - 36.0 g/dL   RDW 40.9 81.1 - 91.4 %   Platelets 147 (L) 150 - 400 K/uL   nRBC 0.0 0.0 - 0.2 %   Neutrophils Relative % 85 %   Neutro Abs 11.3 (H) 1.7 - 7.7 K/uL   Lymphocytes Relative 7 %   Lymphs Abs 0.9 0.7 - 4.0 K/uL   Monocytes Relative 8 %   Monocytes Absolute 1.1 (H) 0.1 - 1.0 K/uL   Eosinophils Relative 0 %   Eosinophils Absolute 0.0 0.0 - 0.5 K/uL   Basophils Relative 0 %   Basophils Absolute 0.0 0.0 - 0.1 K/uL   Immature Granulocytes 0 %   Abs Immature Granulocytes 0.04 0.00 - 0.07 K/uL    Comment: Performed at [facility], [address] [place], [place] [address]  Comprehensive metabolic panel     Status: Abnormal   Collection Time: 07/04/23  1:58 AM  Result Value Ref Range   Sodium 138 135 - 145 mmol/L   Potassium 3.6 3.5 - 5.1 mmol/L   Chloride 102 98 - 111 mmol/L   CO2 28 22 - 32 mmol/L    Glucose, Bld 116 (H) 70 - 99 mg/dL    Comment: Glucose reference range applies only to samples taken after fasting for at least 8 hours.   BUN 14 8 - 23 mg/dL   Creatinine, Ser 6.21 0.61 - 1.24 mg/dL   Calcium 9.3 8.9 - 30.8 mg/dL   Total Protein 8.5 (H) 6.5 - 8.1 g/dL   Albumin 4.2 3.5 - 5.0 g/dL   AST 19 15 - 41 U/L   ALT 15 0 - 44 U/L   Alkaline Phosphatase 92 38 - 126 U/L   Total Bilirubin 1.7 (H) 0.3 - 1.2 mg/dL   GFR, Estimated >65 >78 mL/min    Comment: (NOTE) Calculated using the CKD-EPI Creatinine Equation (2021)    Anion gap 8 5 - 15    Comment: Performed at [facility], [address] [place], [place] [address]  Lactic acid, plasma     Status: None   Collection Time: 07/04/23  1:58 AM  Result Value Ref Range   Lactic Acid, Venous 1.3 0.5 - 1.9 mmol/L    Comment: Performed at [facility], [address] [place], [place] [address]  Blood culture (routine x 2)     Status: None (Preliminary result)   Collection Time: 07/04/23  1:58 AM   Specimen: BLOOD  Result Value Ref Range   Specimen Description      BLOOD BLOOD LEFT ARM Performed at [facility], [address] [place], [place] [address]    Special Requests      BOTTLES DRAWN AEROBIC AND ANAEROBIC Blood Culture adequate volume Performed at [facility], [address], [place], [place] [address]    Culture      NO GROWTH < 12 HOURS Performed at [facility], [address], [place], [place] [address]    Report Status PENDING   Blood culture (routine x 2)     Status: None (Preliminary result)   Collection Time: 07/04/23  1:58 AM   Specimen: BLOOD  Result Value Ref Range   Specimen Description      BLOOD BLOOD RIGHT ARM Performed at [facility], [address], [place], [place] [address]    Special Requests  BOTTLES DRAWN AEROBIC AND ANAEROBIC Blood Culture results may not be optimal due to an  excessive volume of blood received in culture bottles Performed at [facility], [address] [place], [place] [address]    Culture      NO GROWTH < 12 HOURS Performed at [facility], [address], [place], [place] [address]    Report Status PENDING   Lipase, blood     Status: None   Collection Time: 07/04/23  1:58 AM  Result Value Ref Range   Lipase 23 11 - 51 U/L    Comment: Performed at [facility], [address] [place], [place] [address]  Resp panel by RT-PCR (RSV, Flu A&B, Covid) Peripheral     Status: None   Collection Time: 07/04/23  1:58 AM   Specimen: Peripheral; Nasal Swab  Result Value Ref Range   SARS Coronavirus 2 by RT PCR NEGATIVE NEGATIVE    Comment: (NOTE) SARS-CoV-2 target nucleic acids are NOT DETECTED.  The SARS-CoV-2 RNA is generally detectable in upper respiratory specimens during the acute phase of infection. The lowest concentration of SARS-CoV-2 viral copies this assay can detect is 138 copies/mL. A negative result does not preclude SARS-Cov-2 infection and should not be used as the sole basis for treatment or other patient management decisions. A negative result may occur with  improper specimen collection/handling, submission of specimen other than nasopharyngeal swab, presence of viral mutation(s) within the areas targeted by this assay, and inadequate number of viral copies(<138 copies/mL). A negative result must be combined with clinical observations, patient history, and epidemiological information. The expected result is Negative.  Fact Sheet for Patients:  BloggerCourse.com  Fact Sheet for Healthcare Providers:  SeriousBroker.it  This test is no t yet approved or cleared by the [place] FDA and  has been authorized for detection and/or diagnosis of SARS-CoV-2 by FDA under an Emergency Use Authorization (EUA). This EUA will remain  in  effect (meaning this test can be used) for the duration of the COVID-19 declaration under Section 564(b)(1) of the Act, 21 U.S.C.section 360bbb-3(b)(1), unless the authorization is terminated  or revoked sooner.       Influenza A by PCR NEGATIVE NEGATIVE   Influenza B by PCR NEGATIVE NEGATIVE    Comment: (NOTE) The Xpert Xpress SARS-CoV-2/FLU/RSV plus assay is intended as an aid in the diagnosis of influenza from Nasopharyngeal swab specimens and should not be used as a sole basis for treatment. Nasal washings and aspirates are unacceptable for Xpert Xpress SARS-CoV-2/FLU/RSV testing.  Fact Sheet for Patients: BloggerCourse.com  Fact Sheet for Healthcare Providers: SeriousBroker.it  This test is not yet approved or cleared by the [place] FDA and has been authorized for detection and/or diagnosis of SARS-CoV-2 by FDA under an Emergency Use Authorization (EUA). This EUA will remain in effect (meaning this test can be used) for the duration of the COVID-19 declaration under Section 564(b)(1) of the Act, 21 U.S.C. section 360bbb-3(b)(1), unless the authorization is terminated or revoked.     Resp Syncytial Virus by PCR NEGATIVE NEGATIVE    Comment: (NOTE) Fact Sheet for Patients: BloggerCourse.com  Fact Sheet for Healthcare Providers: SeriousBroker.it  This test is not yet approved or cleared by the [place] FDA and has been authorized for detection and/or diagnosis of SARS-CoV-2 by FDA under an Emergency Use Authorization (EUA). This EUA will remain in effect (meaning this test can be used) for the duration of the COVID-19 declaration under  Section 564(b)(1) of the Act, 21 U.S.C. section 360bbb-3(b)(1), unless the authorization is terminated or revoked.  Performed at [facility], [address], [place], [place] [address]   Urinalysis, w/  Reflex to Culture (Infection Suspected) -Urine, Clean Catch     Status: Abnormal   Collection Time: 07/04/23  7:35 AM  Result Value Ref Range   Specimen Source URINE, CLEAN CATCH    Color, Urine YELLOW YELLOW   APPearance CLEAR CLEAR   Specific Gravity, Urine >1.046 (H) 1.005 - 1.030   pH 6.0 5.0 - 8.0   Glucose, UA NEGATIVE NEGATIVE mg/dL   Hgb urine dipstick NEGATIVE NEGATIVE   Bilirubin Urine NEGATIVE NEGATIVE   Ketones, ur NEGATIVE NEGATIVE mg/dL   Protein, ur 30 (A) NEGATIVE mg/dL   Nitrite NEGATIVE NEGATIVE   Leukocytes,Ua NEGATIVE NEGATIVE   RBC / HPF 0-5 0 - 5 RBC/hpf   WBC, UA 0-5 0 - 5 WBC/hpf    Comment:        Reflex urine culture not performed if WBC <=10, OR if Squamous epithelial cells >5. If Squamous epithelial cells >5 suggest recollection.    Bacteria, UA RARE (A) NONE SEEN   Squamous Epithelial / HPF 0-5 0 - 5 /HPF   Mucus PRESENT     Comment: Performed at [facility], [address], [place], [place] [address]  Protime-INR     Status: Abnormal   Collection Time: 07/04/23  8:50 AM  Result Value Ref Range   Prothrombin Time 17.9 (H) 11.4 - 15.2 seconds   INR 1.5 (H) 0.8 - 1.2    Comment: (NOTE) INR goal varies based on device and disease states. Performed at [facility], [address] [place], [place] [address]   APTT     Status: None   Collection Time: 07/04/23  8:50 AM  Result Value Ref Range   aPTT 25 24 - 36 seconds    Comment: Performed at [facility], [address] [place], [place] [address]  Magnesium     Status: None   Collection Time: 07/04/23  8:50 AM  Result Value Ref Range   Magnesium 1.8 1.7 - 2.4 mg/dL    Comment: Performed at [facility], [address], [place], [place] [address]  Phosphorus     Status: None   Collection Time: 07/04/23  8:50 AM  Result Value Ref Range   Phosphorus 3.1 2.5 - 4.6 mg/dL    Comment: Performed at [facility], [address], [place], [place] [address]   US Abdomen Limited RUQ (LIVER/GB)  Result Date: 07/04/2023 CLINICAL DATA:  Right upper quadrant pain. EXAM: ULTRASOUND ABDOMEN LIMITED RIGHT UPPER QUADRANT COMPARISON:  None Available. FINDINGS: Gallbladder: Multiple gallstones evident measuring up to 11 mm. Gallbladder wall thickness varies from 3-5 mm thickness. Small volume pericholecystic fluid evident. Common bile duct: Diameter: Not well seen. Liver: Coarsening of hepatic echotexture evident without a discrete or focal mass lesion evident. Suspicion for trace intrahepatic biliary duct dilatation. Portal vein is patent on color Doppler imaging with normal direction of blood flow towards the liver. Other: None. IMPRESSION: 1. Cholelithiasis with gallbladder wall thickening and small volume pericholecystic fluid. Imaging features are concerning for acute cholecystitis. 2. Suspicion for trace intrahepatic biliary duct dilatation. Common bile duct not well seen. Electronically Signed   By: Kennith Center M.D.   On: 07/04/2023 05:40   CT ABDOMEN PELVIS W CONTRAST  Result Date: 07/04/2023 CLINICAL  DATA:  Bowel obstruction suspected. Ongoing vomiting. History of lung cancer. EXAM: CT ABDOMEN AND PELVIS WITH CONTRAST TECHNIQUE: Multidetector CT imaging of the abdomen and pelvis was performed using the standard protocol following bolus administration of intravenous contrast. RADIATION DOSE REDUCTION: This exam was performed according to the departmental dose-optimization program which includes automated exposure control, adjustment of the mA and/or kV according to patient size and/or use of iterative reconstruction technique. CONTRAST:  OMNIPAQUE IOHEXOL 300 MG/ML  SOLN COMPARISON:  04/08/2023 FINDINGS: Lower chest: Reticulation in the left lower lobe. No acute finding. Hepatobiliary: No focal liver abnormality.Edematous thickening of the gallbladder which is distended. The CBD measures up to 9 mm in  diameter. No calcified gallstone. Pancreas: Generalized atrophy. Spleen: Unremarkable. Adrenals/Urinary Tract: Negative adrenals. No hydronephrosis or stone. Unremarkable bladder. Stomach/Bowel:  No obstruction. No appendicitis. Vascular/Lymphatic: No acute vascular abnormality. Severe atheromatous plaque affecting the aorta and branch vessels with bilateral chronic SFA and left common iliac artery occlusions. Numerous bilateral inflow stenoses. No mass or adenopathy. Reproductive:No pathologic findings. Other: No ascites or pneumoperitoneum. Musculoskeletal: No acute abnormalities. IMPRESSION: 1. Gallbladder distension and edema as with acute cholecystitis. No calcified gallstones, suggest ultrasound correlation. 2. Severe atherosclerosis with chronic occlusions of the left common iliac and bilateral SFA. Electronically Signed   By: Tiburcio Pea M.D.   On: 07/04/2023 04:04   CT Head Wo Contrast  Result Date: 07/04/2023 CLINICAL DATA:  Head trauma EXAM: CT HEAD WITHOUT CONTRAST CT CERVICAL SPINE WITHOUT CONTRAST TECHNIQUE: Multidetector CT imaging of the head and cervical spine was performed following the standard protocol without intravenous contrast. Multiplanar CT image reconstructions of the cervical spine were also generated. RADIATION DOSE REDUCTION: This exam was performed according to the departmental dose-optimization program which includes automated exposure control, adjustment of the mA and/or kV according to patient size and/or use of iterative reconstruction technique. 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 Ex vacuo dilatation of the left lateral ventricle. There is hypoattenuation of the periventricular white matter, most commonly indicating chronic ischemic microangiopathy. Vascular: No abnormal hyperdensity of the major intracranial arteries or dural venous sinuses. No intracranial  atherosclerosis. Skull: The visualized skull base, calvarium and extracranial soft tissues are normal. Sinuses/Orbits: No fluid levels or advanced mucosal thickening of the visualized paranasal sinuses. No mastoid or middle ear effusion. The orbits are normal. CT CERVICAL SPINE FINDINGS Alignment: No static subluxation. Facets are aligned. Occipital condyles are normally positioned. Skull base and vertebrae: No acute fracture. Soft tissues and spinal canal: No prevertebral fluid or swelling. No visible canal hematoma. Disc levels: No advanced spinal canal or neural foraminal stenosis. Upper chest: No pneumothorax, pulmonary nodule or pleural effusion. Other: Normal visualized paraspinal cervical soft tissues. IMPRESSION: 1. No acute fracture or static subluxation of the cervical spine. 2. 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 Electronically Signed   By: Deatra Robinson M.D.   On: 07/04/2023 03:25   CT Cervical Spine Wo Contrast  Result Date: 07/04/2023 CLINICAL DATA:  Head trauma EXAM: CT HEAD WITHOUT CONTRAST CT CERVICAL SPINE WITHOUT CONTRAST TECHNIQUE: Multidetector CT imaging of the head and cervical spine was performed following the standard protocol without intravenous contrast. Multiplanar CT image reconstructions of the cervical spine were also generated. RADIATION DOSE REDUCTION: This exam was performed according to the departmental dose-optimization program which includes automated exposure control, adjustment of the mA and/or kV according to patient size and/or  use of iterative reconstruction technique. 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 Ex vacuo dilatation of the left lateral ventricle. There is hypoattenuation of the periventricular white matter, most commonly indicating chronic ischemic microangiopathy. Vascular: No abnormal hyperdensity of the major  intracranial arteries or dural venous sinuses. No intracranial atherosclerosis. Skull: The visualized skull base, calvarium and extracranial soft tissues are normal. Sinuses/Orbits: No fluid levels or advanced mucosal thickening of the visualized paranasal sinuses. No mastoid or middle ear effusion. The orbits are normal. CT CERVICAL SPINE FINDINGS Alignment: No static subluxation. Facets are aligned. Occipital condyles are normally positioned. Skull base and vertebrae: No acute fracture. Soft tissues and spinal canal: No prevertebral fluid or swelling. No visible canal hematoma. Disc levels: No advanced spinal canal or neural foraminal stenosis. Upper chest: No pneumothorax, pulmonary nodule or pleural effusion. Other: Normal visualized paraspinal cervical soft tissues. IMPRESSION: 1. No acute fracture or static subluxation of the cervical spine. 2. 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 Electronically Signed   By: Deatra Robinson M.D.   On: 07/04/2023 03:25   DG Chest Port 1 View  Result Date: 07/04/2023 CLINICAL DATA:  Fever, cancer patient, vomiting EXAM: PORTABLE CHEST 1 VIEW COMPARISON:  Radiographs 06/11/2023 FINDINGS: No change from 06/11/2023. Unchanged dense bandlike consolidation in the peripheral right mid lung with biopsy clip. Bandlike fibrotic change in the mid left lung. No new focal consolidation, pleural effusion, or pneumothorax. No displaced rib fractures. Stable cardiomediastinal silhouette. Aortic atherosclerotic calcification. IMPRESSION: Unchanged consolidation/scarring in the bilateral mid lungs. Electronically Signed   By: Minerva Fester M.D.   On: 07/04/2023 01:25      Assessment/Plan Acute cholecystitis  - CT AP with gallbladder wall thickening  - RUQ [place] with cholelithiasis and gallbladder wall thickening - WBC 13.4, low grade fever, significantly ttp in RUQ with positive Murphy sign on exam  - hold eliquis and get cardiac clearance for surgery but  patient would benefit from cholecystectomy  - possibly tomorrow, will need to discuss with family as well for consent   FEN: ok to have CLD from surgery standpoint and will make NPO after MN, IVF @100cc /h per [facility] VTE: hold eliquis, ok for heparin gtt if needed  ID: [email or web address]/[email or web address]  - per [facility] -  Hx of PE on Eliquis - last dose 7/15 AM Hx of CVA with aphasia and R hemiparesis  Hx of HTN Hypotension on midodrine TID HLD GERD Seizure disease Lung cancer Peripheral vascular disease  I reviewed ED provider notes, hospitalist notes, last 24 h vitals and pain scores, last 48 h intake and output, last 24 h labs and trends, and last 24 h imaging results.   Juliet Rude, [facility] Surgery 07/04/2023, 10:46 AM Please see Amion for pager number during day hours 7:00am-4:30pm

## 2023-07-04 NOTE — Progress Notes (Signed)
*  PRELIMINARY RESULTS* Echocardiogram 2D Echocardiogram has been performed.  Todd Estrada 07/04/2023, 4:10 PM

## 2023-07-04 NOTE — ED Triage Notes (Signed)
Arrives POV with cousin.   Pt seen on 7/14 for vomiting which has not resolved. Was transferring to bedside commode and fell hitting head on "something".   Compliant on Eliquis. Also hx of lung cancer awaiting new evaluation from PCP.   Found to be febrile @ 100.3 in triage.

## 2023-07-04 NOTE — ED Provider Notes (Signed)
[place] EMERGENCY DEPARTMENT AT [facility] Provider Note   CSN: [id number] Arrival date & time: 07/04/23  0010     History  Chief Complaint  Patient presents with   Fall   Fever    Todd Estrada is a 62 y.o. male.  The history is provided by the patient and medical records.  Fall  Fever  62 year old male with history of emphysema, history of PE on Eliquis, seizure disorder, lung cancer, dyslipidemia, peripheral arterial disease, presenting to the ED after a fall.  He was apparently getting up to use his bedside commode and fell striking right side of his head.  There was no loss of consciousness.  Patient was noted to be febrile on arrival.  He admits he has been feeling feverish the past few days.  He has had ongoing vomiting, seen here 07/02/2023 for same with reassuring labs but did not have any formal imaging.  He reports some continued lower abdominal pain.  Does have known lung cancer, most recent scans with progressing disease.  He was referred to radiation oncology-- awaiting PET scan for treatment recommendations.  Home Medications Prior to Admission medications   Medication Sig Start Date End Date Taking? Authorizing Provider  albuterol (PROVENTIL) (2.5 MG/3ML) 0.083% nebulizer solution Take 3 mLs (2.5 mg total) by nebulization every 4 (four) hours as needed for wheezing or shortness of breath. Patient not taking: Reported on 04/13/2023 08/16/22   Steffanie Rainwater, MD  apixaban (ELIQUIS) 5 MG TABS tablet TAKE 1 TABLET(5 MG) BY MOUTH TWICE DAILY Patient taking differently: Take 5 mg by mouth 2 (two) times daily. 04/11/23   Merrilyn Puma, MD  atorvastatin (LIPITOR) 40 MG tablet Take 1 tablet (40 mg total) by mouth daily. 01/03/23 01/03/24  Merrilyn Puma, MD  budesonide (PULMICORT) 0.5 MG/2ML nebulizer solution NEW PRESCRIPTION REQEUST: BUDESONIDE 0.5 MG/ - USE ONE VIAL TWICE DAILY Patient taking differently: Take 0.5 mg by nebulization in the morning and at  bedtime. 11/29/22   Martina Sinner, MD  folic acid (FOLVITE) 1 MG tablet Take 1 tablet (1 mg total) by mouth daily. Patient not taking: Reported on 06/03/2023 03/21/23   Merrilyn Puma, MD  levETIRAcetam (KEPPRA) 500 MG tablet Take 1 tablet (500 mg total) by mouth 2 (two) times daily. 12/07/22   Ihor Austin, NP  midodrine (PROAMATINE) 2.5 MG tablet Take 1 tablet (2.5 mg total) by mouth 3 (three) times daily with meals. 01/06/23 07/05/23  Merrilyn Puma, MD  Multiple Vitamin (MULTIVITAMIN WITH MINERALS) TABS tablet Take 1 tablet by mouth daily. Patient taking differently: Take 1 tablet by mouth daily with breakfast. 09/20/22   Doran Stabler, DO  ondansetron (ZOFRAN-ODT) 4 MG disintegrating tablet Take 1 tablet (4 mg total) by mouth every 8 (eight) hours as needed for up to 3 days for nausea or vomiting. 07/02/23 07/05/23  Cardama, Amadeo Garnet, MD  oxyCODONE-acetaminophen (PERCOCET/ROXICET) 5-325 MG tablet Take 1 tablet by mouth every 6 (six) hours as needed for severe pain. 06/03/23   Prosperi, Christian H, PA-C  sucralfate (CARAFATE) 1 GM/10ML suspension Take 10 mLs (1 g total) by mouth 4 (four) times daily -  with meals and at bedtime. 07/02/23   Nira Conn, MD  Tiotropium Bromide-Olodaterol (STIOLTO RESPIMAT) 2.5-2.5 MCG/ACT AERS NEW PRESCRIPTION REQUEST: STIOLTO 2.5 MCG- INHALE TWO PUFFS BY MOUTH DAILY Patient taking differently: Inhale 2 each into the lungs daily. 11/29/22   Martina Sinner, MD      Allergies    Patient has  no known allergies.    Review of Systems   Review of Systems  Constitutional:  Positive for fever.  All other systems reviewed and are negative.   Physical Exam Updated Vital Signs BP (!) 140/68   Pulse 87   Temp (!) 100.7 F (38.2 C) (Oral)   Resp 18   Ht 6' (1.829 m)   Wt 74.8 kg   SpO2 92%   BMI 22.38 kg/m   Physical Exam Vitals and nursing note reviewed.  Constitutional:      Appearance: He is well-developed.     Comments: Feels warm to  touch  HENT:     Head: Normocephalic and atraumatic.     Comments: Contusion right parietal scalp, no open wound/laceration Eyes:     Conjunctiva/sclera: Conjunctivae normal.     Pupils: Pupils are equal, round, and reactive to light.  Cardiovascular:     Rate and Rhythm: Normal rate and regular rhythm.     Heart sounds: Normal heart sounds.  Pulmonary:     Effort: Pulmonary effort is normal. No respiratory distress.     Breath sounds: Normal breath sounds. No rhonchi.  Abdominal:     General: Bowel sounds are normal.     Palpations: Abdomen is soft.     Tenderness: There is abdominal tenderness in the right lower quadrant and left lower quadrant.  Musculoskeletal:        General: Normal range of motion.     Cervical back: Normal range of motion.  Skin:    General: Skin is warm and dry.  Neurological:     Mental Status: He is alert.     Comments: AAOx3, speech difficulty at baseline but able to correctly answer "yes" and "no" questions     ED Results / Procedures / Treatments   Labs (all labs ordered are listed, but only abnormal results are displayed) Labs Reviewed  CBC WITH DIFFERENTIAL/PLATELET - Abnormal; Notable for the following components:      Result Value   WBC 13.4 (*)    Platelets 147 (*)    Neutro Abs 11.3 (*)    Monocytes Absolute 1.1 (*)    All other components within normal limits  COMPREHENSIVE METABOLIC PANEL - Abnormal; Notable for the following components:   Glucose, Bld 116 (*)    Total Protein 8.5 (*)    Total Bilirubin 1.7 (*)    All other components within normal limits  RESP PANEL BY RT-PCR (RSV, FLU A&B, COVID)  [id number]  CULTURE, BLOOD (ROUTINE X 2)  CULTURE, BLOOD (ROUTINE X 2)  LACTIC ACID, PLASMA  LIPASE, BLOOD  URINALYSIS, W/ REFLEX TO CULTURE (INFECTION SUSPECTED)    EKG None  Radiology US Abdomen Limited RUQ (LIVER/GB)  Result Date: 07/04/2023 CLINICAL DATA:  Right upper quadrant pain. EXAM: ULTRASOUND ABDOMEN LIMITED RIGHT UPPER  QUADRANT COMPARISON:  None Available. FINDINGS: Gallbladder: Multiple gallstones evident measuring up to 11 mm. Gallbladder wall thickness varies from 3-5 mm thickness. Small volume pericholecystic fluid evident. Common bile duct: Diameter: Not well seen. Liver: Coarsening of hepatic echotexture evident without a discrete or focal mass lesion evident. Suspicion for trace intrahepatic biliary duct dilatation. Portal vein is patent on color Doppler imaging with normal direction of blood flow towards the liver. Other: None. IMPRESSION: 1. Cholelithiasis with gallbladder wall thickening and small volume pericholecystic fluid. Imaging features are concerning for acute cholecystitis. 2. Suspicion for trace intrahepatic biliary duct dilatation. Common bile duct not well seen. Electronically Signed   By: Kennith Center  M.D.   On: 07/04/2023 05:40   CT ABDOMEN PELVIS W CONTRAST  Result Date: 07/04/2023 CLINICAL DATA:  Bowel obstruction suspected. Ongoing vomiting. History of lung cancer. EXAM: CT ABDOMEN AND PELVIS WITH CONTRAST TECHNIQUE: Multidetector CT imaging of the abdomen and pelvis was performed using the standard protocol following bolus administration of intravenous contrast. RADIATION DOSE REDUCTION: This exam was performed according to the departmental dose-optimization program which includes automated exposure control, adjustment of the mA and/or kV according to patient size and/or use of iterative reconstruction technique. CONTRAST:  OMNIPAQUE IOHEXOL 300 MG/ML  SOLN COMPARISON:  04/08/2023 FINDINGS: Lower chest: Reticulation in the left lower lobe. No acute finding. Hepatobiliary: No focal liver abnormality.Edematous thickening of the gallbladder which is distended. The CBD measures up to 9 mm in diameter. No calcified gallstone. Pancreas: Generalized atrophy. Spleen: Unremarkable. Adrenals/Urinary Tract: Negative adrenals. No hydronephrosis or stone. Unremarkable bladder. Stomach/Bowel:  No  obstruction. No appendicitis. Vascular/Lymphatic: No acute vascular abnormality. Severe atheromatous plaque affecting the aorta and branch vessels with bilateral chronic SFA and left common iliac artery occlusions. Numerous bilateral inflow stenoses. No mass or adenopathy. Reproductive:No pathologic findings. Other: No ascites or pneumoperitoneum. Musculoskeletal: No acute abnormalities. IMPRESSION: 1. Gallbladder distension and edema as with acute cholecystitis. No calcified gallstones, suggest ultrasound correlation. 2. Severe atherosclerosis with chronic occlusions of the left common iliac and bilateral SFA. Electronically Signed   By: Tiburcio Pea M.D.   On: 07/04/2023 04:04   CT Head Wo Contrast  Result Date: 07/04/2023 CLINICAL DATA:  Head trauma EXAM: CT HEAD WITHOUT CONTRAST CT CERVICAL SPINE WITHOUT CONTRAST TECHNIQUE: Multidetector CT imaging of the head and cervical spine was performed following the standard protocol without intravenous contrast. Multiplanar CT image reconstructions of the cervical spine were also generated. RADIATION DOSE REDUCTION: This exam was performed according to the departmental dose-optimization program which includes automated exposure control, adjustment of the mA and/or kV according to patient size and/or use of iterative reconstruction technique. 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 Ex vacuo dilatation of the left lateral ventricle. There is hypoattenuation of the periventricular white matter, most commonly indicating chronic ischemic microangiopathy. Vascular: No abnormal hyperdensity of the major intracranial arteries or dural venous sinuses. No intracranial atherosclerosis. Skull: The visualized skull base, calvarium and extracranial soft tissues are normal. Sinuses/Orbits: No fluid levels or advanced mucosal thickening of the visualized paranasal  sinuses. No mastoid or middle ear effusion. The orbits are normal. CT CERVICAL SPINE FINDINGS Alignment: No static subluxation. Facets are aligned. Occipital condyles are normally positioned. Skull base and vertebrae: No acute fracture. Soft tissues and spinal canal: No prevertebral fluid or swelling. No visible canal hematoma. Disc levels: No advanced spinal canal or neural foraminal stenosis. Upper chest: No pneumothorax, pulmonary nodule or pleural effusion. Other: Normal visualized paraspinal cervical soft tissues. IMPRESSION: 1. No acute fracture or static subluxation of the cervical spine. 2. 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 Electronically Signed   By: Deatra Robinson M.D.   On: 07/04/2023 03:25   CT Cervical Spine Wo Contrast  Result Date: 07/04/2023 CLINICAL DATA:  Head trauma EXAM: CT HEAD WITHOUT CONTRAST CT CERVICAL SPINE WITHOUT CONTRAST TECHNIQUE: Multidetector CT imaging of the head and cervical spine was performed following the standard protocol without intravenous contrast. Multiplanar CT image reconstructions of the cervical spine were also generated. RADIATION DOSE REDUCTION: This exam was performed according to the departmental  dose-optimization program which includes automated exposure control, adjustment of the mA and/or kV according to patient size and/or use of iterative reconstruction technique. 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 Ex vacuo dilatation of the left lateral ventricle. There is hypoattenuation of the periventricular white matter, most commonly indicating chronic ischemic microangiopathy. Vascular: No abnormal hyperdensity of the major intracranial arteries or dural venous sinuses. No intracranial atherosclerosis. Skull: The visualized skull base, calvarium and extracranial soft tissues are normal. Sinuses/Orbits: No fluid levels or  advanced mucosal thickening of the visualized paranasal sinuses. No mastoid or middle ear effusion. The orbits are normal. CT CERVICAL SPINE FINDINGS Alignment: No static subluxation. Facets are aligned. Occipital condyles are normally positioned. Skull base and vertebrae: No acute fracture. Soft tissues and spinal canal: No prevertebral fluid or swelling. No visible canal hematoma. Disc levels: No advanced spinal canal or neural foraminal stenosis. Upper chest: No pneumothorax, pulmonary nodule or pleural effusion. Other: Normal visualized paraspinal cervical soft tissues. IMPRESSION: 1. No acute fracture or static subluxation of the cervical spine. 2. 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 Electronically Signed   By: Deatra Robinson M.D.   On: 07/04/2023 03:25   DG Chest Port 1 View  Result Date: 07/04/2023 CLINICAL DATA:  Fever, cancer patient, vomiting EXAM: PORTABLE CHEST 1 VIEW COMPARISON:  Radiographs 06/11/2023 FINDINGS: No change from 06/11/2023. Unchanged dense bandlike consolidation in the peripheral right mid lung with biopsy clip. Bandlike fibrotic change in the mid left lung. No new focal consolidation, pleural effusion, or pneumothorax. No displaced rib fractures. Stable cardiomediastinal silhouette. Aortic atherosclerotic calcification. IMPRESSION: Unchanged consolidation/scarring in the bilateral mid lungs. Electronically Signed   By: Minerva Fester M.D.   On: 07/04/2023 01:25    Procedures Procedures    Medications Ordered in ED Medications  cefTRIAXone (ROCEPHIN) 2 g in sodium chloride 0.9 % 100 mL IVPB (2 g Intravenous New Bag/Given (Non-Interop) 07/04/23 0606)  sodium chloride 0.9 % bolus 1,000 mL (1,000 mLs Intravenous Bolus 07/04/23 0142)  ondansetron (ZOFRAN) injection 4 mg (4 mg Intravenous Given 07/04/23 0143)  iohexol (OMNIPAQUE) 300 MG/ML solution 100 mL (100 mLs Intravenous Contrast Given 07/04/23 0304)  acetaminophen (TYLENOL) tablet 650 mg (650 mg  Oral Given 07/04/23 8416)    ED Course/ Medical Decision Making/ A&P                             Medical Decision Making Amount and/or Complexity of Data Reviewed Labs: ordered. Radiology: ordered and independent interpretation performed. ECG/medicine tests: ordered and independent interpretation performed.  Risk OTC drugs. Prescription drug management. Decision regarding hospitalization.   62 year old male presenting to the ED after a fall.  Getting up to use a bedside commode and struck the right side of his head.  There was no loss of consciousness.  He is anticoagulated on Eliquis.  He was noted to febrile on arrival and has had continued vomiting since ER visit 2 days ago.  Did not have imaging at that time.    Febrile and tachy, BP 90's.  He does take midodrine per MAR.  Will check labs, lactate, cultures.  CT head/neck given his fall and chronic anticoagulation, CT AP for continued vomiting.  Labs as above-- leukocytosis 13.5 but normal lactate.  Normal LFT's, bili 1.7.  Lipase WNL. CT head/neck negative for acute findings.  CTAP with concern for  cholecystitis-- recommended RUQ [place].  [place] confirms suspicion for acute cholecystitis.  He was given IV antibiotics and Tylenol for fever and his temperature did increase while here, now 100.35F.  His tachycardia has resolved and BP is stable after IVF.  He has not had any acute emesis here in the ED.  Will discuss with general surgery but anticipate he will need medical admission given his complex history.  5:55 AM Spoke with on call surgery, Dr. Sheliah Hatch-- will evaluate this AM.  Agrees with medical admission.  Spoke with hospitalist, Dr. Joneen Roach-- will admit for ongoing care.  Final Clinical Impression(s) / ED Diagnoses Final diagnoses:  Cholecystitis    Rx / DC Orders ED Discharge Orders     None         Garlon Hatchet, PA-C 07/04/23 3086    Tilden Fossa, MD 07/04/23 [phone]

## 2023-07-04 NOTE — ED Notes (Signed)
ED TO INPATIENT HANDOFF REPORT  Name/Age/Gender [name] 62 y.o. male  Code Status    Code Status Orders  (From admission, onward)           Start     Ordered   07/04/23 0725  Do not attempt resuscitation (DNR)  Continuous       Question Answer Comment  If patient has no pulse and is not breathing Do Not Attempt Resuscitation   If patient has a pulse and/or is breathing: Medical Treatment Goals LIMITED ADDITIONAL INTERVENTIONS: Use medication/IV fluids and cardiac monitoring as indicated; Do not use intubation or mechanical ventilation (DNI), also provide comfort medications.  Transfer to Progressive/Stepdown as indicated, avoid Intensive Care.   Consent: Discussion documented in EHR or advanced directives reviewed      07/04/23 0724           Code Status History     Date Active Date Inactive Code Status Order ID Comments User Context   04/13/2023 0300 04/15/2023 1716 DNR [id number]  Hillary Bow, DO ED   04/13/2023 0243 04/13/2023 0300 DNR [id number]  Hillary Bow, DO ED   10/12/2022 1252 10/14/2022 2225 Full Code [id number]  Teddy Spike, DO ED   09/14/2022 0541 09/17/2022 0002 DNR [id number]  Steffanie Rainwater, MD ED   08/27/2022 0556 08/30/2022 2322 DNR [id number]  Merrilyn Puma, MD ED   08/13/2022 0120 08/17/2022 0015 DNR [id number]  Angie Fava, DO ED   08/13/2022 0019 08/13/2022 0120 Full Code [id number]  Howerter, Chaney Born, DO ED   03/22/2022 1447 04/15/2022 2331 Full Code [id number]  Milinda Antis, PA-C Inpatient   03/22/2022 1447 03/22/2022 1447 Full Code [id number]  Milinda Antis, PA-C Inpatient   03/03/2022 0008 03/22/2022 1444 Full Code [id number]  Julieanne Cotton, MD Inpatient   03/02/2022 2116 03/03/2022 0008 Full Code [id number]  Erick Blinks, MD ED   08/19/2021 0742 08/21/2021 2112 Full Code [id number]  Zierle-Ghosh, [place], DO ED       Home/SNF/Other Home  Chief Complaint Acute cholecystitis [K81.0]  Level of Care/Admitting Diagnosis ED  Disposition     ED Disposition  Admit   Condition  --   Comment  Hospital Area: [facility] [[id number]]  Level of Care: Telemetry [5]  Admit to tele based on following criteria: Monitor for Ischemic changes  May admit patient to [facility] or [facility] if equivalent level of care is available:: No  Covid Evaluation: Asymptomatic - no recent exposure (last 10 days) testing not required  Diagnosis: Acute cholecystitis [575.0.ICD-9-CM]  Admitting Physician: DERRIUS, FURTICK [[id number]]  Attending Physician: JEFERSON, BOOZER [[id number]]  Certification:: I certify this patient will need inpatient services for at least 2 midnights  Estimated Length of Stay: 2          Medical History Past Medical History:  Diagnosis Date   Asthma    Atypical chest pain 08/13/2022   Community acquired pneumonia 09/14/2022   COPD (chronic obstructive pulmonary disease) (HCC)    Essential hypertension 08/19/2021   GERD (gastroesophageal reflux disease)    HAP (hospital-acquired pneumonia) 09/16/2022   History of tracheostomy    03/09/22-04/11/22   HLD (hyperlipidemia)    Hypertension    Hypokalemia 08/13/2022   Hypomagnesemia 08/13/2022   Lung cancer (HCC)    PAD (peripheral artery disease) (HCC)    Seizures (HCC) 06/02/2022   Sepsis (HCC) 08/13/2022   Sepsis due to pneumonia (HCC) 04/13/2023   Stroke (HCC) 02/2022  Allergies No Known Allergies  IV Location/Drains/Wounds Patient Lines/Drains/Airways Status     Active Line/Drains/Airways     Name Placement date Placement time Site Days   Peripheral IV 07/04/23 Anterior;Left;Proximal Forearm 07/04/23  0141  Forearm  less than 1   Peripheral IV 07/04/23 Right Antecubital 07/04/23  0156  Antecubital  less than 1   Gastrostomy/Enterostomy Percutaneous endoscopic gastrostomy (PEG) 24 Fr. LUQ 03/11/22  1019  LUQ  480   Pressure Injury 04/13/23 Buttocks Right Stage 2 -  Partial thickness loss of dermis presenting as a  shallow open injury with a red, pink wound bed without slough. base red, scant bloddy drainage 04/13/23  0455  -- 82            Labs/Imaging Results for orders placed or performed during the hospital encounter of 07/04/23 (from the past 48 hour(s))  CBC with Differential     Status: Abnormal   Collection Time: 07/04/23  1:58 AM  Result Value Ref Range   WBC 13.4 (H) 4.0 - 10.5 K/uL   RBC 4.73 4.22 - 5.81 MIL/uL   Hemoglobin 14.1 13.0 - 17.0 g/dL   HCT 16.1 09.6 - 04.5 %   MCV 93.7 80.0 - 100.0 fL   MCH 29.8 26.0 - 34.0 pg   MCHC 31.8 30.0 - 36.0 g/dL   RDW 40.9 81.1 - 91.4 %   Platelets 147 (L) 150 - 400 K/uL   nRBC 0.0 0.0 - 0.2 %   Neutrophils Relative % 85 %   Neutro Abs 11.3 (H) 1.7 - 7.7 K/uL   Lymphocytes Relative 7 %   Lymphs Abs 0.9 0.7 - 4.0 K/uL   Monocytes Relative 8 %   Monocytes Absolute 1.1 (H) 0.1 - 1.0 K/uL   Eosinophils Relative 0 %   Eosinophils Absolute 0.0 0.0 - 0.5 K/uL   Basophils Relative 0 %   Basophils Absolute 0.0 0.0 - 0.1 K/uL   Immature Granulocytes 0 %   Abs Immature Granulocytes 0.04 0.00 - 0.07 K/uL    Comment: Performed at [facility], [address], [place], [place] [address]  Comprehensive metabolic panel     Status: Abnormal   Collection Time: 07/04/23  1:58 AM  Result Value Ref Range   Sodium 138 135 - 145 mmol/L   Potassium 3.6 3.5 - 5.1 mmol/L   Chloride 102 98 - 111 mmol/L   CO2 28 22 - 32 mmol/L   Glucose, Bld 116 (H) 70 - 99 mg/dL    Comment: Glucose reference range applies only to samples taken after fasting for at least 8 hours.   BUN 14 8 - 23 mg/dL   Creatinine, Ser 6.21 0.61 - 1.24 mg/dL   Calcium 9.3 8.9 - 30.8 mg/dL   Total Protein 8.5 (H) 6.5 - 8.1 g/dL   Albumin 4.2 3.5 - 5.0 g/dL   AST 19 15 - 41 U/L   ALT 15 0 - 44 U/L   Alkaline Phosphatase 92 38 - 126 U/L   Total Bilirubin 1.7 (H) 0.3 - 1.2 mg/dL   GFR, Estimated >65 >78 mL/min    Comment: (NOTE) Calculated using the CKD-EPI Creatinine  Equation (2021)    Anion gap 8 5 - 15    Comment: Performed at [facility], [address] [place], [place] [address]  Lactic acid, plasma     Status: None   Collection Time: 07/04/23  1:58 AM  Result Value Ref Range   Lactic Acid, Venous 1.3  0.5 - 1.9 mmol/L    Comment: Performed at [facility], [address], [place], [place] [address]  Lipase, blood     Status: None   Collection Time: 07/04/23  1:58 AM  Result Value Ref Range   Lipase 23 11 - 51 U/L    Comment: Performed at [facility], [address], [place], [place] [address]  Resp panel by RT-PCR (RSV, Flu A&B, Covid) Peripheral     Status: None   Collection Time: 07/04/23  1:58 AM   Specimen: Peripheral; Nasal Swab  Result Value Ref Range   SARS Coronavirus 2 by RT PCR NEGATIVE NEGATIVE    Comment: (NOTE) SARS-CoV-2 target nucleic acids are NOT DETECTED.  The SARS-CoV-2 RNA is generally detectable in upper respiratory specimens during the acute phase of infection. The lowest concentration of SARS-CoV-2 viral copies this assay can detect is 138 copies/mL. A negative result does not preclude SARS-Cov-2 infection and should not be used as the sole basis for treatment or other patient management decisions. A negative result may occur with  improper specimen collection/handling, submission of specimen other than nasopharyngeal swab, presence of viral mutation(s) within the areas targeted by this assay, and inadequate number of viral copies(<138 copies/mL). A negative result must be combined with clinical observations, patient history, and epidemiological information. The expected result is Negative.  Fact Sheet for Patients:  BloggerCourse.com  Fact Sheet for Healthcare Providers:  SeriousBroker.it  This test is no t yet approved or cleared by the [place] FDA and  has been authorized for detection and/or  diagnosis of SARS-CoV-2 by FDA under an Emergency Use Authorization (EUA). This EUA will remain  in effect (meaning this test can be used) for the duration of the COVID-19 declaration under Section 564(b)(1) of the Act, 21 U.S.C.section 360bbb-3(b)(1), unless the authorization is terminated  or revoked sooner.       Influenza A by PCR NEGATIVE NEGATIVE   Influenza B by PCR NEGATIVE NEGATIVE    Comment: (NOTE) The Xpert Xpress SARS-CoV-2/FLU/RSV plus assay is intended as an aid in the diagnosis of influenza from Nasopharyngeal swab specimens and should not be used as a sole basis for treatment. Nasal washings and aspirates are unacceptable for Xpert Xpress SARS-CoV-2/FLU/RSV testing.  Fact Sheet for Patients: BloggerCourse.com  Fact Sheet for Healthcare Providers: SeriousBroker.it  This test is not yet approved or cleared by the [place] FDA and has been authorized for detection and/or diagnosis of SARS-CoV-2 by FDA under an Emergency Use Authorization (EUA). This EUA will remain in effect (meaning this test can be used) for the duration of the COVID-19 declaration under Section 564(b)(1) of the Act, 21 U.S.C. section 360bbb-3(b)(1), unless the authorization is terminated or revoked.     Resp Syncytial Virus by PCR NEGATIVE NEGATIVE    Comment: (NOTE) Fact Sheet for Patients: BloggerCourse.com  Fact Sheet for Healthcare Providers: SeriousBroker.it  This test is not yet approved or cleared by the [place] FDA and has been authorized for detection and/or diagnosis of SARS-CoV-2 by FDA under an Emergency Use Authorization (EUA). This EUA will remain in effect (meaning this test can be used) for the duration of the COVID-19 declaration under Section 564(b)(1) of the Act, 21 U.S.C. section 360bbb-3(b)(1), unless the authorization is terminated or revoked.  Performed  at [facility], [address], [place], [place] [address]    US Abdomen Limited RUQ (LIVER/GB)  Result Date: 07/04/2023 CLINICAL DATA:  Right upper quadrant pain.  EXAM: ULTRASOUND ABDOMEN LIMITED RIGHT UPPER QUADRANT COMPARISON:  None Available. FINDINGS: Gallbladder: Multiple gallstones evident measuring up to 11 mm. Gallbladder wall thickness varies from 3-5 mm thickness. Small volume pericholecystic fluid evident. Common bile duct: Diameter: Not well seen. Liver: Coarsening of hepatic echotexture evident without a discrete or focal mass lesion evident. Suspicion for trace intrahepatic biliary duct dilatation. Portal vein is patent on color Doppler imaging with normal direction of blood flow towards the liver. Other: None. IMPRESSION: 1. Cholelithiasis with gallbladder wall thickening and small volume pericholecystic fluid. Imaging features are concerning for acute cholecystitis. 2. Suspicion for trace intrahepatic biliary duct dilatation. Common bile duct not well seen. Electronically Signed   By: Kennith Center M.D.   On: 07/04/2023 05:40   CT ABDOMEN PELVIS W CONTRAST  Result Date: 07/04/2023 CLINICAL DATA:  Bowel obstruction suspected. Ongoing vomiting. History of lung cancer. EXAM: CT ABDOMEN AND PELVIS WITH CONTRAST TECHNIQUE: Multidetector CT imaging of the abdomen and pelvis was performed using the standard protocol following bolus administration of intravenous contrast. RADIATION DOSE REDUCTION: This exam was performed according to the departmental dose-optimization program which includes automated exposure control, adjustment of the mA and/or kV according to patient size and/or use of iterative reconstruction technique. CONTRAST:  OMNIPAQUE IOHEXOL 300 MG/ML  SOLN COMPARISON:  04/08/2023 FINDINGS: Lower chest: Reticulation in the left lower lobe. No acute finding. Hepatobiliary: No focal liver abnormality.Edematous thickening of the gallbladder which is distended. The  CBD measures up to 9 mm in diameter. No calcified gallstone. Pancreas: Generalized atrophy. Spleen: Unremarkable. Adrenals/Urinary Tract: Negative adrenals. No hydronephrosis or stone. Unremarkable bladder. Stomach/Bowel:  No obstruction. No appendicitis. Vascular/Lymphatic: No acute vascular abnormality. Severe atheromatous plaque affecting the aorta and branch vessels with bilateral chronic SFA and left common iliac artery occlusions. Numerous bilateral inflow stenoses. No mass or adenopathy. Reproductive:No pathologic findings. Other: No ascites or pneumoperitoneum. Musculoskeletal: No acute abnormalities. IMPRESSION: 1. Gallbladder distension and edema as with acute cholecystitis. No calcified gallstones, suggest ultrasound correlation. 2. Severe atherosclerosis with chronic occlusions of the left common iliac and bilateral SFA. Electronically Signed   By: Tiburcio Pea M.D.   On: 07/04/2023 04:04   CT Head Wo Contrast  Result Date: 07/04/2023 CLINICAL DATA:  Head trauma EXAM: CT HEAD WITHOUT CONTRAST CT CERVICAL SPINE WITHOUT CONTRAST TECHNIQUE: Multidetector CT imaging of the head and cervical spine was performed following the standard protocol without intravenous contrast. Multiplanar CT image reconstructions of the cervical spine were also generated. RADIATION DOSE REDUCTION: This exam was performed according to the departmental dose-optimization program which includes automated exposure control, adjustment of the mA and/or kV according to patient size and/or use of iterative reconstruction technique. 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 Ex vacuo dilatation of the left lateral ventricle. There is hypoattenuation of the periventricular white matter, most commonly indicating chronic ischemic microangiopathy. Vascular: No abnormal hyperdensity of the major intracranial arteries or dural venous  sinuses. No intracranial atherosclerosis. Skull: The visualized skull base, calvarium and extracranial soft tissues are normal. Sinuses/Orbits: No fluid levels or advanced mucosal thickening of the visualized paranasal sinuses. No mastoid or middle ear effusion. The orbits are normal. CT CERVICAL SPINE FINDINGS Alignment: No static subluxation. Facets are aligned. Occipital condyles are normally positioned. Skull base and vertebrae: No acute fracture. Soft tissues and spinal canal: No prevertebral fluid or swelling. No visible canal hematoma. Disc levels: No advanced spinal canal or neural  foraminal stenosis. Upper chest: No pneumothorax, pulmonary nodule or pleural effusion. Other: Normal visualized paraspinal cervical soft tissues. IMPRESSION: 1. No acute fracture or static subluxation of the cervical spine. 2. 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 Electronically Signed   By: Deatra Robinson M.D.   On: 07/04/2023 03:25   CT Cervical Spine Wo Contrast  Result Date: 07/04/2023 CLINICAL DATA:  Head trauma EXAM: CT HEAD WITHOUT CONTRAST CT CERVICAL SPINE WITHOUT CONTRAST TECHNIQUE: Multidetector CT imaging of the head and cervical spine was performed following the standard protocol without intravenous contrast. Multiplanar CT image reconstructions of the cervical spine were also generated. RADIATION DOSE REDUCTION: This exam was performed according to the departmental dose-optimization program which includes automated exposure control, adjustment of the mA and/or kV according to patient size and/or use of iterative reconstruction technique. 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 Ex vacuo dilatation of the left lateral ventricle. There is hypoattenuation of the periventricular white matter, most commonly indicating chronic ischemic microangiopathy. Vascular: No abnormal  hyperdensity of the major intracranial arteries or dural venous sinuses. No intracranial atherosclerosis. Skull: The visualized skull base, calvarium and extracranial soft tissues are normal. Sinuses/Orbits: No fluid levels or advanced mucosal thickening of the visualized paranasal sinuses. No mastoid or middle ear effusion. The orbits are normal. CT CERVICAL SPINE FINDINGS Alignment: No static subluxation. Facets are aligned. Occipital condyles are normally positioned. Skull base and vertebrae: No acute fracture. Soft tissues and spinal canal: No prevertebral fluid or swelling. No visible canal hematoma. Disc levels: No advanced spinal canal or neural foraminal stenosis. Upper chest: No pneumothorax, pulmonary nodule or pleural effusion. Other: Normal visualized paraspinal cervical soft tissues. IMPRESSION: 1. No acute fracture or static subluxation of the cervical spine. 2. 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 Electronically Signed   By: Deatra Robinson M.D.   On: 07/04/2023 03:25   DG Chest Port 1 View  Result Date: 07/04/2023 CLINICAL DATA:  Fever, cancer patient, vomiting EXAM: PORTABLE CHEST 1 VIEW COMPARISON:  Radiographs 06/11/2023 FINDINGS: No change from 06/11/2023. Unchanged dense bandlike consolidation in the peripheral right mid lung with biopsy clip. Bandlike fibrotic change in the mid left lung. No new focal consolidation, pleural effusion, or pneumothorax. No displaced rib fractures. Stable cardiomediastinal silhouette. Aortic atherosclerotic calcification. IMPRESSION: Unchanged consolidation/scarring in the bilateral mid lungs. Electronically Signed   By: Minerva Fester M.D.   On: 07/04/2023 01:25    Pending Labs Unresulted Labs (From admission, onward)     Start     Ordered   07/05/23 0500  CBC  Daily,   R      07/04/23 0724   07/05/23 0500  Comprehensive metabolic panel  Daily,   R      07/04/23 0724   07/04/23 0042  Blood culture (routine x 2)  BLOOD  CULTURE X 2,   R (with STAT occurrences)      07/04/23 0041   07/04/23 0042  Urinalysis, w/ Reflex to Culture (Infection Suspected) -Urine, Clean Catch  Once,   URGENT       Question:  Specimen Source  Answer:  Urine, Clean Catch   07/04/23 0041            Vitals/Pain Today's Vitals   07/04/23 0500 07/04/23 0600 07/04/23 0630 07/04/23 0712  BP: (!) 140/68 109/75 111/79   Pulse: 87 90 89   Resp: 18  18    Temp:    99.9 F (37.7 C)  TempSrc:    Oral  SpO2: 92% 93% 93%   Weight:      Height:        Isolation Precautions No active isolations  Medications Medications  cefTRIAXone (ROCEPHIN) 2 g in sodium chloride 0.9 % 100 mL IVPB (has no administration in time range)  metroNIDAZOLE (FLAGYL) IVPB 500 mg (has no administration in time range)  0.9 % NaCl with KCl 20 mEq/ L  infusion (has no administration in time range)  acetaminophen (TYLENOL) tablet 650 mg (has no administration in time range)    Or  acetaminophen (TYLENOL) suppository 650 mg (has no administration in time range)  sodium phosphate (FLEET) 7-19 GM/118ML enema 1 enema (has no administration in time range)  ondansetron (ZOFRAN) tablet 4 mg (has no administration in time range)    Or  ondansetron (ZOFRAN) injection 4 mg (has no administration in time range)  pantoprazole (PROTONIX) injection 40 mg (has no administration in time range)  sodium chloride 0.9 % bolus 1,000 mL (1,000 mLs Intravenous Bolus 07/04/23 0142)  ondansetron (ZOFRAN) injection 4 mg (4 mg Intravenous Given 07/04/23 0143)  iohexol (OMNIPAQUE) 300 MG/ML solution 100 mL (100 mLs Intravenous Contrast Given 07/04/23 0304)  acetaminophen (TYLENOL) tablet 650 mg (650 mg Oral Given 07/04/23 0604)  cefTRIAXone (ROCEPHIN) 2 g in sodium chloride 0.9 % 100 mL IVPB (0 g Intravenous Stopped 07/04/23 0710)    Mobility non-ambulatory

## 2023-07-04 NOTE — TOC Initial Note (Signed)
Transition of Care [facility]) - Initial/Assessment Note    Patient Details  Name: Todd Estrada MRN: [id number] Date of Birth: 1961/04/14  Transition of Care [facility]) CM/SW Contact:    Lanier Clam, RN Phone Number: 07/04/2023, 3:51 PM  Clinical Narrative:  awaiting medical/cardio clearance for surgery-Cholecystectomy.    Monitor for d/c needs.             Expected Discharge Plan: Home/Self Care Barriers to Discharge: Continued Medical Work up   Patient Goals and CMS Choice Patient states their goals for this hospitalization and ongoing recovery are:: Home CMS Medicare.gov Compare Post Acute Care list provided to:: Patient Choice offered to / list presented to : Patient [place] ownership interest in [facility].provided to:: Patient    Expected Discharge Plan and Services   Discharge Planning Services: CM Consult                                          Prior Living Arrangements/Services   Lives with:: Self Patient language and need for interpreter reviewed:: Yes Do you feel safe going back to the place where you live?: Yes      Need for Family Participation in Patient Care: Yes (Comment) Care giver support system in place?: Yes (comment)   Criminal Activity/Legal Involvement Pertinent to Current Situation/Hospitalization: No - Comment as needed  Activities of Daily Living Home Assistive Devices/Equipment: Bedside commode/3-in-1, Wheelchair, Environmental consultant (specify type) ADL Screening (condition at time of admission) Patient's cognitive ability adequate to safely complete daily activities?: Yes Is the patient deaf or have difficulty hearing?: No Does the patient have difficulty seeing, even when wearing glasses/contacts?: No Does the patient have difficulty concentrating, remembering, or making decisions?: No Patient able to express need for assistance with ADLs?: No Does the patient have difficulty dressing or bathing?: Yes Independently performs ADLs?:  No Communication: Dependent Is this a change from baseline?: Pre-admission baseline Dressing (OT): Needs assistance Is this a change from baseline?: Pre-admission baseline Grooming: Needs assistance Is this a change from baseline?: Pre-admission baseline Feeding: Independent Bathing: Needs assistance Is this a change from baseline?: Pre-admission baseline Toileting: Needs assistance Is this a change from baseline?: Pre-admission baseline In/Out Bed: Needs assistance Is this a change from baseline?: Pre-admission baseline Walks in Home: Independent with device (comment) Does the patient have difficulty walking or climbing stairs?: Yes Weakness of Legs: Right Weakness of Arms/Hands: Right  Permission Sought/Granted Permission sought to share information with : Case Manager Permission granted to share information with : Yes, Verbal Permission Granted  Share Information with NAME: Case Manager           Emotional Assessment Appearance:: Appears stated age Attitude/Demeanor/Rapport: Gracious Affect (typically observed): Accepting Orientation: : Oriented to Self, Oriented to Place, Oriented to  Time, Oriented to Situation Alcohol / Substance Use: Not Applicable Psych Involvement: No (comment)  Admission diagnosis:  Acute cholecystitis [K81.0] Cholecystitis [K81.9] Patient Active Problem List   Diagnosis Date Noted   Acute cholecystitis 07/04/2023   Right upper lobe pneumonia 04/13/2023   Buttock wound, right, subsequent encounter 03/01/2023   PAD (peripheral artery disease) (HCC) 01/06/2023   Chronic hypotension 01/06/2023   History of pulmonary embolism 10/12/2022   Cervical transverse process fracture (HCC) 09/10/2022   Frequent falls 08/27/2022   Aphasia as late effect of cerebrovascular accident 08/16/2022   Anemia of chronic disease 08/13/2022   Chronic heart failure  with mildly reduced ejection fraction (HFmrEF) (HCC) 08/13/2022   Necrotizing pneumonia (HCC)  08/12/2022   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) 06/24/2022   Encounter for antineoplastic chemotherapy 06/24/2022   Healthcare maintenance 06/22/2022   History of seizures 06/02/2022   Squamous cell lung cancer (HCC) 05/10/2022   Emphysema lung (HCC) 04/14/2022   Dyslipidemia    Dysphagia, post-stroke    Internal carotid artery occlusion, left 03/03/2022   Malnutrition of moderate degree 03/03/2022   Tobacco use disorder 08/19/2021   Hx of ischemic left MCA stroke 08/18/2021   PCP:  [name], MD Pharmacy:   [id number] [place], [place] - [address] AT [facility] [address] [place] [address] Phone: [phone] Fax: [phone]     Social Determinants of Health (SDOH) Social History: SDOH Screenings   Food Insecurity: No Food Insecurity (04/14/2023)  Housing: Low Risk  (04/14/2023)  Transportation Needs: No Transportation Needs (04/14/2023)  Utilities: Not At Risk (04/14/2023)  Depression (PHQ2-9): Low Risk  (06/01/2023)  Social Connections: Socially Isolated (01/06/2023)  Tobacco Use: Medium Risk (07/04/2023)   SDOH Interventions:     Readmission Risk Interventions    08/30/2022    3:39 PM 08/16/2022    4:28 PM  Readmission Risk Prevention Plan  Transportation Screening Complete Complete  Medication Review (RN Care Manager) Complete Complete  PCP or Specialist appointment within 3-5 days of discharge Complete --  HRI or Home Care Consult Complete Complete  SW Recovery Care/Counseling Consult Complete Complete  Palliative Care Screening Not Applicable Not Applicable  Skilled Nursing Facility Not Applicable Not Applicable

## 2023-07-05 ENCOUNTER — Inpatient Hospital Stay (HOSPITAL_COMMUNITY): Payer: Medicaid Other | Admitting: Anesthesiology

## 2023-07-05 ENCOUNTER — Encounter (HOSPITAL_COMMUNITY)
Admission: EM | Disposition: A | Discharge status: Home / Self Care | Payer: Self-pay | Source: Home / Self Care | Attending: Internal Medicine

## 2023-07-05 ENCOUNTER — Inpatient Hospital Stay (HOSPITAL_COMMUNITY): Payer: Medicaid Other

## 2023-07-05 ENCOUNTER — Encounter (HOSPITAL_COMMUNITY): Payer: Self-pay | Admitting: Emergency Medicine

## 2023-07-05 DIAGNOSIS — I110 Hypertensive heart disease with heart failure: Secondary | ICD-10-CM

## 2023-07-05 DIAGNOSIS — K828 Other specified diseases of gallbladder: Secondary | ICD-10-CM

## 2023-07-05 DIAGNOSIS — Z87891 Personal history of nicotine dependence: Secondary | ICD-10-CM

## 2023-07-05 DIAGNOSIS — K8012 Calculus of gallbladder with acute and chronic cholecystitis without obstruction: Secondary | ICD-10-CM | POA: Diagnosis not present

## 2023-07-05 DIAGNOSIS — K810 Acute cholecystitis: Secondary | ICD-10-CM | POA: Diagnosis not present

## 2023-07-05 DIAGNOSIS — I5022 Chronic systolic (congestive) heart failure: Secondary | ICD-10-CM | POA: Diagnosis not present

## 2023-07-05 HISTORY — PX: CHOLECYSTECTOMY: SHX55

## 2023-07-05 LAB — COMPREHENSIVE METABOLIC PANEL
ALT: 19 U/L (ref 0–44)
AST: 20 U/L (ref 15–41)
Albumin: 2.9 g/dL — ABNORMAL LOW (ref 3.5–5.0)
Alkaline Phosphatase: 65 U/L (ref 38–126)
Anion gap: 7 (ref 5–15)
BUN: 9 mg/dL (ref 8–23)
CO2: 24 mmol/L (ref 22–32)
Calcium: 8 mg/dL — ABNORMAL LOW (ref 8.9–10.3)
Chloride: 103 mmol/L (ref 98–111)
Creatinine, Ser: 0.72 mg/dL (ref 0.61–1.24)
GFR, Estimated: >60 mL/min (ref >60)
Glucose, Bld: 108 mg/dL — ABNORMAL HIGH (ref 70–99)
Potassium: 3.6 mmol/L (ref 3.5–5.1)
Sodium: 134 mmol/L — ABNORMAL LOW (ref 135–145)
Total Bilirubin: 0.9 mg/dL (ref 0.3–1.2)
Total Protein: 6.2 g/dL — ABNORMAL LOW (ref 6.5–8.1)

## 2023-07-05 LAB — LIPID PANEL
Cholesterol: 80 mg/dL (ref 0–200)
HDL: 37 mg/dL — ABNORMAL LOW (ref >40)
LDL Cholesterol: 33 mg/dL (ref 0–99)
Total CHOL/HDL Ratio: 2.2 RATIO
Triglycerides: 49 mg/dL (ref <150)
VLDL: 10 mg/dL (ref 0–40)

## 2023-07-05 LAB — CBC
HCT: 37.1 % — ABNORMAL LOW (ref 39.0–52.0)
Hemoglobin: 11.6 g/dL — ABNORMAL LOW (ref 13.0–17.0)
MCH: 29.1 pg (ref 26.0–34.0)
MCHC: 31.3 g/dL (ref 30.0–36.0)
MCV: 93 fL (ref 80.0–100.0)
Platelets: 140 10*3/uL — ABNORMAL LOW (ref 150–400)
RBC: 3.99 MIL/uL — ABNORMAL LOW (ref 4.22–5.81)
RDW: 13.3 % (ref 11.5–15.5)
WBC: 9.2 10*3/uL (ref 4.0–10.5)
nRBC: 0 % (ref 0.0–0.2)

## 2023-07-05 LAB — SURGICAL PCR SCREEN
MRSA, PCR: NEGATIVE
Staphylococcus aureus: NEGATIVE

## 2023-07-05 SURGERY — LAPAROSCOPIC CHOLECYSTECTOMY WITH INTRAOPERATIVE CHOLANGIOGRAM
Anesthesia: General

## 2023-07-05 MED ORDER — KETOROLAC TROMETHAMINE 30 MG/ML IJ SOLN
INTRAMUSCULAR | Status: AC
  Filled 2023-07-05: qty 1

## 2023-07-05 MED ORDER — IOHEXOL 300 MG/ML  SOLN
INTRAMUSCULAR | Status: DC | PRN
  Administered 2023-07-05: 5 mL

## 2023-07-05 MED ORDER — LIDOCAINE HCL (PF) 2 % IJ SOLN
INTRAMUSCULAR | Status: AC
  Filled 2023-07-05: qty 5

## 2023-07-05 MED ORDER — DEXAMETHASONE SODIUM PHOSPHATE 10 MG/ML IJ SOLN
INTRAMUSCULAR | Status: DC | PRN
  Administered 2023-07-05: 10 mg via INTRAVENOUS

## 2023-07-05 MED ORDER — PHENYLEPHRINE 80 MCG/ML (10ML) SYRINGE FOR IV PUSH (FOR BLOOD PRESSURE SUPPORT)
PREFILLED_SYRINGE | INTRAVENOUS | Status: AC
  Filled 2023-07-05: qty 10

## 2023-07-05 MED ORDER — OXYCODONE HCL 5 MG PO TABS: 5.0000 mg | ORAL_TABLET | Freq: Once | ORAL | Status: DC | PRN

## 2023-07-05 MED ORDER — ROCURONIUM BROMIDE 10 MG/ML (PF) SYRINGE
PREFILLED_SYRINGE | INTRAVENOUS | Status: DC | PRN
  Administered 2023-07-05: 10 mg via INTRAVENOUS
  Administered 2023-07-05: 60 mg via INTRAVENOUS

## 2023-07-05 MED ORDER — CHLORHEXIDINE GLUCONATE 0.12 % MT SOLN
15.0000 mL | Freq: Once | OROMUCOSAL | Status: AC
  Administered 2023-07-05: 15 mL via OROMUCOSAL

## 2023-07-05 MED ORDER — MIDAZOLAM HCL 2 MG/2ML IJ SOLN
INTRAMUSCULAR | Status: AC
  Filled 2023-07-05: qty 2

## 2023-07-05 MED ORDER — LACTATED RINGERS IV SOLN: INTRAVENOUS | Status: DC

## 2023-07-05 MED ORDER — ONDANSETRON HCL 4 MG/2ML IJ SOLN
INTRAMUSCULAR | Status: DC | PRN
  Administered 2023-07-05: 4 mg via INTRAVENOUS

## 2023-07-05 MED ORDER — LIDOCAINE 2% (20 MG/ML) 5 ML SYRINGE
INTRAMUSCULAR | Status: DC | PRN
  Administered 2023-07-05: 80 mg via INTRAVENOUS

## 2023-07-05 MED ORDER — PROPOFOL 10 MG/ML IV BOLUS
INTRAVENOUS | Status: DC | PRN
  Administered 2023-07-05: 100 mg via INTRAVENOUS

## 2023-07-05 MED ORDER — ORAL CARE MOUTH RINSE: 15.0000 mL | Freq: Once | OROMUCOSAL | Status: AC

## 2023-07-05 MED ORDER — BUPIVACAINE LIPOSOME 1.3 % IJ SUSP
INTRAMUSCULAR | Status: AC
  Filled 2023-07-05: qty 20

## 2023-07-05 MED ORDER — SUCCINYLCHOLINE CHLORIDE 200 MG/10ML IV SOSY
PREFILLED_SYRINGE | INTRAVENOUS | Status: AC
  Filled 2023-07-05: qty 10

## 2023-07-05 MED ORDER — OXYCODONE HCL 5 MG/5ML PO SOLN: 5.0000 mg | Freq: Once | ORAL | Status: DC | PRN

## 2023-07-05 MED ORDER — PIPERACILLIN-TAZOBACTAM 3.375 G IVPB
3.3750 g | Freq: Three times a day (TID) | INTRAVENOUS | Status: DC
  Administered 2023-07-05 – 2023-07-09 (×11): 3.375 g via INTRAVENOUS
  Filled 2023-07-05 (×11): qty 50

## 2023-07-05 MED ORDER — 0.9 % SODIUM CHLORIDE (POUR BTL) OPTIME
TOPICAL | Status: DC | PRN
  Administered 2023-07-05: 1000 mL

## 2023-07-05 MED ORDER — PROMETHAZINE HCL 25 MG/ML IJ SOLN: 6.2500 mg | INTRAMUSCULAR | Status: DC | PRN

## 2023-07-05 MED ORDER — ONDANSETRON HCL 4 MG/2ML IJ SOLN
INTRAMUSCULAR | Status: AC
  Filled 2023-07-05: qty 2

## 2023-07-05 MED ORDER — BUPIVACAINE LIPOSOME 1.3 % IJ SUSP
INTRAMUSCULAR | Status: DC | PRN
  Administered 2023-07-05: 20 mL

## 2023-07-05 MED ORDER — BUPIVACAINE HCL 0.25 % IJ SOLN
INTRAMUSCULAR | Status: DC | PRN
  Administered 2023-07-05: 30 mL

## 2023-07-05 MED ORDER — ROCURONIUM BROMIDE 10 MG/ML (PF) SYRINGE
PREFILLED_SYRINGE | INTRAVENOUS | Status: AC
  Filled 2023-07-05: qty 10

## 2023-07-05 MED ORDER — MEPERIDINE HCL 50 MG/ML IJ SOLN: 6.2500 mg | INTRAMUSCULAR | Status: DC | PRN

## 2023-07-05 MED ORDER — FENTANYL CITRATE (PF) 100 MCG/2ML IJ SOLN
INTRAMUSCULAR | Status: DC | PRN
  Administered 2023-07-05 (×2): 50 ug via INTRAVENOUS

## 2023-07-05 MED ORDER — HYDROMORPHONE HCL 1 MG/ML IJ SOLN: 0.2500 mg | INTRAMUSCULAR | Status: DC | PRN

## 2023-07-05 MED ORDER — PHENYLEPHRINE 80 MCG/ML (10ML) SYRINGE FOR IV PUSH (FOR BLOOD PRESSURE SUPPORT)
PREFILLED_SYRINGE | INTRAVENOUS | Status: DC | PRN
  Administered 2023-07-05 (×4): 160 ug via INTRAVENOUS

## 2023-07-05 MED ORDER — PROPOFOL 10 MG/ML IV BOLUS
INTRAVENOUS | Status: AC
  Filled 2023-07-05: qty 20

## 2023-07-05 MED ORDER — LACTATED RINGERS IR SOLN
Status: DC | PRN
  Administered 2023-07-05: 2000 mL

## 2023-07-05 MED ORDER — DEXAMETHASONE SODIUM PHOSPHATE 10 MG/ML IJ SOLN
INTRAMUSCULAR | Status: AC
  Filled 2023-07-05: qty 1

## 2023-07-05 MED ORDER — MIDAZOLAM HCL 2 MG/2ML IJ SOLN: 0.5000 mg | Freq: Once | INTRAMUSCULAR | Status: DC | PRN

## 2023-07-05 MED ORDER — SUGAMMADEX SODIUM 200 MG/2ML IV SOLN
INTRAVENOUS | Status: DC | PRN
  Administered 2023-07-05: 200 mg via INTRAVENOUS

## 2023-07-05 MED ORDER — FENTANYL CITRATE (PF) 100 MCG/2ML IJ SOLN
INTRAMUSCULAR | Status: AC
  Filled 2023-07-05: qty 2

## 2023-07-05 MED ORDER — BUPIVACAINE HCL 0.25 % IJ SOLN
INTRAMUSCULAR | Status: AC
  Filled 2023-07-05: qty 1

## 2023-07-05 SURGICAL SUPPLY — 50 items
APPLIER CLIP 5 13 M/L LIGAMAX5 (MISCELLANEOUS)
APPLIER CLIP ROT 10 11.4 M/L (STAPLE)
APR CLP MED LRG 11.4X10 (STAPLE)
APR CLP MED LRG 5 ANG JAW (MISCELLANEOUS)
BAG COUNTER SPONGE SURGICOUNT (BAG) ×1 IMPLANT
BAG SPEC RTRVL 10 TROC 200 (ENDOMECHANICALS) ×1
BAG SPNG CNTER NS LX DISP (BAG) ×1
CABLE HIGH FREQUENCY MONO STRZ (ELECTRODE) IMPLANT
CLIP APPLIE 5 13 M/L LIGAMAX5 (MISCELLANEOUS) IMPLANT
CLIP APPLIE ROT 10 11.4 M/L (STAPLE) IMPLANT
COVER MAYO STAND XLG (MISCELLANEOUS) ×1 IMPLANT
COVER SURGICAL LIGHT HANDLE (MISCELLANEOUS) ×1 IMPLANT
DRAIN CHANNEL 19F RND (DRAIN) IMPLANT
DRAIN JP 7F FLT 3/4 PRF SI HBL (DRAIN) IMPLANT
DRAPE 3/4 80X56 (DRAPES) IMPLANT
DRAPE C-ARM 42X120 X-RAY (DRAPES) ×1 IMPLANT
DRAPE UTILITY XL STRL (DRAPES) ×1 IMPLANT
DRAPE WARM FLUID 44X44 (DRAPES) ×1 IMPLANT
DRSG TEGADERM 2-3/8X2-3/4 SM (GAUZE/BANDAGES/DRESSINGS) ×2 IMPLANT
DRSG TEGADERM 6X8 (GAUZE/BANDAGES/DRESSINGS) ×1 IMPLANT
ELECT REM PT RETURN 15FT ADLT (MISCELLANEOUS) ×1 IMPLANT
ENDOLOOP SUT PDS II 0 18 (SUTURE) IMPLANT
GLOVE ECLIPSE 8.0 STRL XLNG CF (GLOVE) ×1 IMPLANT
GLOVE INDICATOR 8.0 STRL GRN (GLOVE) ×1 IMPLANT
GOWN STRL REUS W/ TWL XL LVL3 (GOWN DISPOSABLE) ×2 IMPLANT
GOWN STRL REUS W/TWL XL LVL3 (GOWN DISPOSABLE) ×2
IRRIG SUCT STRYKERFLOW 2 WTIP (MISCELLANEOUS) ×1
IRRIGATION SUCT STRKRFLW 2 WTP (MISCELLANEOUS) ×1 IMPLANT
KIT BASIN OR (CUSTOM PROCEDURE TRAY) ×1 IMPLANT
KIT TURNOVER KIT A (KITS) IMPLANT
NDL INSUFFLATION 14GA 120MM (NEEDLE) IMPLANT
NEEDLE INSUFFLATION 14GA 120MM (NEEDLE) ×1 IMPLANT
PENCIL SMOKE EVACUATOR (MISCELLANEOUS) IMPLANT
POUCH RETRIEVAL ECOSAC 10 (ENDOMECHANICALS) ×1 IMPLANT
SCISSORS LAP 5X35 DISP (ENDOMECHANICALS) ×1 IMPLANT
SET CHOLANGIOGRAPH MIX (MISCELLANEOUS) ×1 IMPLANT
SET TUBE SMOKE EVAC HIGH FLOW (TUBING) ×1 IMPLANT
SLEEVE ADV FIXATION 5X100MM (TROCAR) ×1 IMPLANT
SPIKE FLUID TRANSFER (MISCELLANEOUS) ×1 IMPLANT
SUT MNCRL AB 4-0 PS2 18 (SUTURE) ×1 IMPLANT
SUT PDS AB 1 CT 36 (SUTURE) ×1 IMPLANT
SUT PDS AB 1 CT1 27 (SUTURE) IMPLANT
SUT PROLENE 2 0 SH DA (SUTURE) IMPLANT
SYR 20ML LL LF (SYRINGE) ×1 IMPLANT
TOWEL OR 17X26 10 PK STRL BLUE (TOWEL DISPOSABLE) ×1 IMPLANT
TOWEL OR NON WOVEN STRL DISP B (DISPOSABLE) ×1 IMPLANT
TRAY LAPAROSCOPIC (CUSTOM PROCEDURE TRAY) ×1 IMPLANT
TROCAR ADV FIXATION 12X100MM (TROCAR) ×1 IMPLANT
TROCAR ADV FIXATION 5X100MM (TROCAR) ×1 IMPLANT
TROCAR Z-THREAD OPTICAL 5X100M (TROCAR) ×1 IMPLANT

## 2023-07-05 NOTE — Op Note (Signed)
07/05/2023  PATIENT:  Todd Estrada  62 y.o. male  Patient Care Team: [name], MD as PCP - General Christell Constant, MD as PCP - Cardiology (Cardiology)  PRE-OPERATIVE DIAGNOSIS:    Acute on Chronic Calculus Cholecystitis  POST-OPERATIVE DIAGNOSIS:   Acute on Chronic Calculus Cholecystitis with ischemia and empyema Distal common bile duct obstruction suspicious for choledocholithiasis  PROCEDURE:  Laparoscopic cholecystectomy with intraoperative cholangiogram (CPT code 45409)  SURGEON:  Ardeth Sportsman, MD, FACS.  ASSISTANT:   Prudy Feeler, RNFA  ANESTHESIA:  General endotracheal intubation anesthesia (GETA) and Local & regional field block at incision(s) for perioperative & postoperative pain control provided with liposomal bupivacaine (Experel) 20mL mixed with 40mL of bupivicaine 0.25%  Estimated Blood Loss (EBL):   Total I/O In: 600 [I.V.:600] Out: 350 [Urine:300; Blood:50].   (See anesthesia record)  Delay start of Pharmacological VTE agent (>24hrs) due to concerns of significant anemia, surgical blood loss, or risk of bleeding?:  Normally okay to resume heparin drip 12 hours postop but would wait until GI consultation given possible need for ERCP tomorrow  DRAINS: (None) and 19 Fr Blake drain with tip resting in the RUQ   SPECIMEN:  Gallbladder  DISPOSITION OF SPECIMEN:  Pathology  COUNTS:  Sponge, needle, & instrument counts CORRECT  PLAN OF CARE: Admit to inpatient   PATIENT DISPOSITION:  PACU - hemodynamically stable.  INDICATION: Elderly male with multiple medical problems including PE on Eliquis with stroke expressive aphasia and some hemiparesis.  Hypertension and GERD.  Had episode of fall and then worsening nausea and vomiting with fevers.  Came emergency department with CAT scan concerning for cholecystitis.  Placed on IV antibiotics.  Medical and cardiac evaluations done.  Felt to be moderate/high risk but given concerning of infection and no  way to optimize him, consider proceeding with cholecystectomy.  Patient and cousin agreed to proceed  The anatomy & physiology of hepatobiliary & pancreatic function was discussed.  The pathophysiology of gallbladder dysfunction was discussed.  Natural history risks without surgery was discussed.   I feel the risks of no intervention will lead to serious problems that outweigh the operative risks; therefore, I recommended cholecystectomy to remove the pathology.  I explained laparoscopic techniques with possible need for an open approach.  Probable cholangiogram to evaluate the bilary tract was explained as well.    Risks such as bleeding, infection, abscess, leak, injury to other organs, need for further treatment, heart attack, death, and other risks were discussed.  I noted a good likelihood this will help address the problem.  Possibility that this will not correct all abdominal symptoms was explained.  Goals of post-operative recovery were discussed as well.  We will work to minimize complications.  An educational handout further explaining the pathology and treatment options was given as well.  Questions were answered.  The patient and cousin expresses understanding & wishes to proceed with surgery.  OR FINDINGS: Very dilated gallbladder with chronic adhesions in thickening consistent with chronic cholecystitis.  Edema with white bile and some empyema concerning for acute cholecystitis.  Short cystic duct with some stones within it.  Cholangiogram shows very dilated biliary system with reverse meniscus sign and persistent distal common bile duct obstruction suspicious for choledocholithiasis.  Liver: Mild friability steatohepatitis cyst but no concerning for cirrhosis.  Left upper quadrant divot suspicious for old site of gastrostomy tube and thin strand going to stomach and stomach not adherent.  DESCRIPTION:   Informed consent was confirmed.  The patient underwent general anaesthesia without  difficulty.  The patient was positioned appropriately.  VTE prevention in place.  The patient's abdomen was clipped, prepped, & draped in a sterile fashion.  Surgical timeout confirmed our plan.  Peritoneal entry with a laparoscopic port was obtained using Varess spring needle entry technique in the left upper abdomen as the patient was positioned in reverse Trendelenburg.  I induced carbon dioxide insufflation.  No change in end tidal CO2 measurements.  Full symmetrical abdominal distention.  Initial port was carefully placed .  Camera inspection revealed no injury.  Extra ports were carefully placed under direct laparoscopic visualization.   I turned attention to the right upper quadrant.  Gallbladder was very distended and hydropic.  Some evidence of ischemia and significant edema.  Could not be grasped.  Did needle aspiration of clear colorless bile with some empyema and as well concerning for significant acute cholecystitis.  I decompressed it but the gallbladder wall was still extremely thickened.  The gallbladder fundus was elevated cephalad.  Upon entering the abdomen (organ space), I encountered infection of the gallbladder .  I used hook cautery to free the peritoneal coverings between the gallbladder and the liver on the posteriolateral and anteriomedial walls.   I used careful blunt and hook dissection to help get a good critical view of the cystic artery and cystic duct. I did further dissection to free 50%of the gallbladder off the liver bed to get a good critical view of the infundibulum and cystic duct.  I dissected out the cystic artery; and, after getting a good 360 view, ligated the anterior & posterior branches of the cystic artery close on the infundibulum using the Harmonic ultrasonic dissection.  I skeletonized the cystic duct.  I placed a clip on the infundibulum. I did a partial cystic duct-otomy and ensured patency. I placed a 5 [place] cholangiocatheter through a puncture site at the  right subcostal ridge of the abdominal wall and directed it into the cystic duct.  We ran a cholangiogram with dilute radio-opaque contrast and continuous fluoroscopy. Contrast flowed from a side branch consistent with cystic duct cannulization. Contrast flowed up the common hepatic duct into the right and left intrahepatic chains out to secondary radicals. Contrast flowed down the common bile duct easily it was very dilated.  It stopped in the distal common bile duct with a reversed meniscus sign concerning for obstructing choledocholithiasis.  And repeated flushes and repeat cholangiogram.  Again no contrast would pass.   I removed the cholangiocatheter.  I freed the gallbladder from remaining attachments on the liver in a modified dome down positioning.  Ensured hemostasis on the gallbladder fossa of the liver.  I was concerned that there was significant edema and thickening of the cystic duct so I ligated the cystic duct using 0 PDS Endoloop to lassoed around the gallbladder coming down to the cystic duct/common bile duct junction.  I did that x 2.  I then was able to place clips on the cystic duct.   I completed cystic duct transection. I freed the gallbladder from its remaining attachments to the liver. I ensured hemostasis on the gallbladder fossa of the liver and elsewhere. I inspected the rest of the abdomen & detected no injury nor bleeding elsewhere.  I placed the gallbladder inside and EcoSac.  I did copious irrigation.  Given the empyema with significant necrosis/ischemia I decided to leave a drain to lower the chance of postoperative abscess.  Laid over the clips in  the porta hepatis with the tail in the retrohepatic space exiting in the right upper quadrant.  Secured with Prolene suture.  & removed the gallbladder out the subxiphoid larger port site.  I had to open the fascia to 3.5 cm to get the gallbladder out since it had a giant stone and it was extremely thickened.  I closed the extraction  port site fascia transversely using #1 PDS interrupted stitches. I closed the skin using 4-0 monocryl stitch.  Sterile dressings were applied. The patient was extubated & arrived in the PACU in stable condition..  I suspect the patient may need an ERCP.  I sent message to Dr. Ewing Schlein who is covering for [facility] gastroenterology.  Hopefully their team can see if this is possible tomorrow.  From surgery standpoint he can go back on his heparin drip in 12 hours.  However that puts it at 6 AM wearing may need to consider ERCP so I would lean towards waiting and reevaluating first thing in the morning.  I had discussed postoperative care with the patient in the holding area. I discussed operative findings, updated the patient's status, discussed probable steps to recovery, and gave postoperative recommendations to the  patient's cousin who helps with medical decisions given the patient's expressive aphasia. .  Recommendations were made.  Questions were answered.  He expressed understanding & appreciation.  Ardeth Sportsman, M.D., F.A.C.S. Gastrointestinal and Minimally Invasive Surgery [facility] [address], [address] [place], [place] [address] [phone] Main / Paging  07/05/2023 5:54 PM

## 2023-07-05 NOTE — Transfer of Care (Signed)
Immediate Anesthesia Transfer of Care Note  Patient: Todd Estrada  Procedure(s) Performed: LAPAROSCOPIC CHOLECYSTECTOMY WITH INTRAOPERATIVE CHOLANGIOGRAM  Patient Location: PACU  Anesthesia Type:General  Level of Consciousness: awake and drowsy  Airway & Oxygen Therapy: Patient Spontanous Breathing and Patient connected to face mask oxygen  Post-op Assessment: Report given to RN, Post -op Vital signs reviewed and stable, and Patient moving all extremities X 4  Post vital signs: Reviewed and stable  Last Vitals:  Vitals Value Taken Time  BP 158/87 07/05/23 1740  Temp    Pulse 73 07/05/23 1743  Resp 21 07/05/23 1743  SpO2 100 % 07/05/23 1743  Vitals shown include unfiled device data.  Last Pain:  Vitals:   07/05/23 1429  TempSrc:   PainSc: 0-No pain      Patients Stated Pain Goal: 0 (07/04/23 0036)  Complications: No notable events documented.

## 2023-07-05 NOTE — Anesthesia Postprocedure Evaluation (Signed)
Anesthesia Post Note  Patient: Todd Estrada  Procedure(s) Performed: LAPAROSCOPIC CHOLECYSTECTOMY WITH INTRAOPERATIVE CHOLANGIOGRAM     Patient location during evaluation: PACU Anesthesia Type: General Level of consciousness: awake and alert and patient cooperative Pain management: pain level controlled Vital Signs Assessment: post-procedure vital signs reviewed and stable Respiratory status: spontaneous breathing, nonlabored ventilation and respiratory function stable Cardiovascular status: blood pressure returned to baseline and stable Postop Assessment: no apparent nausea or vomiting Anesthetic complications: no   No notable events documented.  Last Vitals:  Vitals:   07/05/23 1815 07/05/23 1840  BP: (!) 168/85 (!) 168/86  Pulse: 80 79  Resp: 20 20  Temp:  36.5 C  SpO2: 98% 100%    Last Pain:  Vitals:   07/05/23 1840  TempSrc: Oral  PainSc:                  Jaria Conway,E. Rea Kalama

## 2023-07-05 NOTE — Progress Notes (Signed)
PROGRESS NOTE  Todd Estrada  DOB: October 13, 1961  PCP: [name], MD XLK:[id number]  DOA: 07/04/2023  LOS: 1 day  Hospital Day: 2  Brief narrative: Todd Estrada is a 62 y.o. 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  Labs unremarkable.  Patient was discharged home with symptomatic management. 7/16, patient presented to the ED with complaint of multiple episodes of vomiting leading to dizziness and fall.  In the ED, patient had a fever of 200.7, heart rate 115, blood pressure 90s later improved Labs with WBC count 13.4, lactic acid 1.3, BMP unremarkable, lipase level normal Respiratory virus panel negative CT chest showed old left MCA territory infarct and old left cerebellar infarcts.  CT abdomen pelvis showed gallbladder distension and edema as with acute cholecystitis. No calcified gallstones, suggest ultrasound correlation. RUQ ultrasound showed cholelithiasis with gallbladder wall thickening and small volume of pericholecystic fluid concerning for acute cholecystitis.  Suspicion for trace intrahepatic biliary duct dilatation.  CBD was not well-seen. Patient was started on IV fluid, IV antibiotics Admitted to [facility]. General surgery recommended cholecystectomy. Seen by cardiology for preoperative risk assessment.  Subjective: Patient was seen and examined this morning.  Pleasant Caucasian male.  Looks older for his age.  Aphasic.  Has right hemiparesis.  Moves head and left hand to answer questions. Overnight, intermittent low-grade fever spikes, hemodynamically stable Labs this morning with WBC count normal,  Assessment and plan: Acute calculus cholecystitis General surgery plans for cholecystectomy today Currently on IV antibiotics Continue IV fluid IV  analgesics, IV antiemetics   History of PE Eliquis on hold.  Resume once okayed by surgery.  H/o ischemic left MCA stroke HLD Residual aphasia and right-sided hemiparesis Continue supportive care Lipitor on hold.  COPD Respiratory status stable.  Echo without evidence of RV dysfunction or severe PH   History of seizures Continue levetiracetam 500 mg p.o. twice daily.  Chronic hypotension Continue midodrine 2.5 mg p.o. twice daily.   Mobility: PT eval postprocedure  Goals of care   Code Status: DNR     DVT prophylaxis:  SCDs Start: 07/04/23 3664   Antimicrobials: IV Rocephin, Flagyl Fluid: None currently Consultants: Surgery, cardiology Family Communication: None at bedside  Status: Inpatient Level of care:  Telemetry   Patient from: Home Anticipated d/c to: Pending clinical course Needs to continue in-hospital care:  Pending surgery today    Diet:  Diet Order             Diet NPO time specified  Diet effective midnight                   Scheduled Meds:  arformoterol  15 mcg Nebulization BID   And   umeclidinium bromide  1 puff Inhalation Daily   levETIRAcetam  500 mg Oral BID   midodrine  2.5 mg Oral BID WC   pantoprazole (PROTONIX) IV  40 mg Intravenous Q24H    PRN meds: acetaminophen **OR** acetaminophen, fentaNYL (SUBLIMAZE) injection, ondansetron **OR** ondansetron (ZOFRAN) IV, mouth rinse, sodium phosphate   Infusions:   cefTRIAXone (ROCEPHIN)  IV 2 g (07/05/23 0547)   metronidazole 500 mg (07/05/23 0955)    Antimicrobials: Anti-infectives (From admission, onward)    Start     Dose/Rate Route Frequency Ordered Stop   07/05/23 0600  cefTRIAXone (  ROCEPHIN) 2 g in sodium chloride 0.9 % 100 mL IVPB        2 g 200 mL/hr over 30 Minutes Intravenous Every 24 hours 07/04/23 0719     07/04/23 0800  metroNIDAZOLE (FLAGYL) IVPB 500 mg        500 mg 100 mL/hr over 60 Minutes Intravenous Every 12 hours 07/04/23 0719     07/04/23 0600   cefTRIAXone (ROCEPHIN) 2 g in sodium chloride 0.9 % 100 mL IVPB        2 g 200 mL/hr over 30 Minutes Intravenous  Once 07/04/23 0551 07/04/23 0710       Nutritional status:  Body mass index is 22.38 kg/m.          Objective: Vitals:   07/05/23 0754 07/05/23 1245  BP:  115/70  Pulse:  78  Resp:  19  Temp:  99.1 F (37.3 C)  SpO2: 97% 94%    Intake/Output Summary (Last 24 hours) at 07/05/2023 1331 Last data filed at 07/05/2023 1120 Gross per 24 hour  Intake 1921.99 ml  Output 3850 ml  Net -1928.01 ml   Filed Weights   07/04/23 0017  Weight: 74.8 kg   Weight change:  Body mass index is 22.38 kg/m.   Physical Exam: General exam: Pleasant, middle-aged Caucasian male.  Looks older for his age Skin: No rashes, lesions or ulcers. HEENT: Atraumatic, normocephalic, no obvious bleeding Lungs: Clear to auscultation bilaterally CVS: Regular rate and rhythm, no murmur GI/Abd soft, right upper quadrant tenderness present, nondistended, bowel sound present CNS: Alert, awake, mumbles only.  Aphasic since last stroke. Psychiatry: Mumbles on a.  Unable to have a meaningful conversation Extremities: No pedal edema, no calf tenderness  Data Review: I have personally reviewed the laboratory data and studies available.  F/u labs ordered Unresulted Labs (From admission, onward)     Start     Ordered   07/05/23 0500  CBC  Daily,   R      07/04/23 0724   07/05/23 0500  Comprehensive metabolic panel  Daily,   R      07/04/23 0724            Total time spent in review of labs and imaging, patient evaluation, formulation of plan, documentation and communication with family: 55 minutes  Signed, Lorin Glass, MD [facility] 07/05/2023

## 2023-07-05 NOTE — Interval H&P Note (Signed)
History and Physical Interval Note:  07/05/2023 3:04 PM  Todd Estrada  has presented today for surgery, with the diagnosis of ACUTE CHOLELITHIASIS.  The various methods of treatment have been discussed with the patient and family. After consideration of risks, benefits and other options for treatment, the patient has consented to  Procedure(s): LAPAROSCOPIC CHOLECYSTECTOMY WITH INTRAOPERATIVE CHOLANGIOGRAM (N/A) as a surgical intervention.  The patient's history has been reviewed, patient examined, no change in status, stable for surgery.  I have reviewed the patient's chart and labs.  Given the patient's multiple medical problems with history of strokes, he is deemed definitely an higher risk than average [facility] cardiology medicine.  However we feel the benefits of cholecystectomy outweigh those risks since he will have worsening problems and interventions without surgery and antibiotic and drains are only temporizing interventions.  He is about stabilized as well as he is going to be.  Family and patient agree to proceed questions were answered to the patient's satisfaction.    I have re-reviewed the the patient's records, history, medications, and allergies.  I have re-examined the patient.  I again discussed intraoperative plans and goals of post-operative recovery.  The patient agrees to proceed.  Todd Estrada  1961/11/11 [id number]  Patient Care Team: [name], MD as PCP - Levell July, MD as PCP - Cardiology (Cardiology)  Patient Active Problem List   Diagnosis Date Noted   Acute cholecystitis 07/04/2023   Right upper lobe pneumonia 04/13/2023   Buttock wound, right, subsequent encounter 03/01/2023   PAD (peripheral artery disease) (HCC) 01/06/2023   Chronic hypotension 01/06/2023   History of pulmonary embolism 10/12/2022   Cervical transverse process fracture (HCC) 09/10/2022   Frequent falls 08/27/2022   Aphasia as late effect of cerebrovascular  accident 08/16/2022   Anemia of chronic disease 08/13/2022   Chronic heart failure with mildly reduced ejection fraction (HFmrEF) (HCC) 08/13/2022   Necrotizing pneumonia (HCC) 08/12/2022   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) 06/24/2022   Encounter for antineoplastic chemotherapy 06/24/2022   Healthcare maintenance 06/22/2022   History of seizures 06/02/2022   Squamous cell lung cancer (HCC) 05/10/2022   Emphysema lung (HCC) 04/14/2022   Dyslipidemia    Dysphagia, post-stroke    Internal carotid artery occlusion, left 03/03/2022   Malnutrition of moderate degree 03/03/2022   Tobacco use disorder 08/19/2021   Hx of ischemic left MCA stroke 08/18/2021    Past Medical History:  Diagnosis Date   Asthma    Atypical chest pain 08/13/2022   Community acquired pneumonia 09/14/2022   COPD (chronic obstructive pulmonary disease) (HCC)    Essential hypertension 08/19/2021   GERD (gastroesophageal reflux disease)    HAP (hospital-acquired pneumonia) 09/16/2022   History of tracheostomy    03/09/22-04/11/22   HLD (hyperlipidemia)    Hypertension    Hypokalemia 08/13/2022   Hypomagnesemia 08/13/2022   Lung cancer (HCC)    PAD (peripheral artery disease) (HCC)    Seizures (HCC) 06/02/2022   Sepsis (HCC) 08/13/2022   Sepsis due to pneumonia (HCC) 04/13/2023   Stroke (HCC) 02/2022    Past Surgical History:  Procedure Laterality Date   BRONCHIAL BIOPSY  05/30/2022   Procedure: BRONCHIAL BIOPSIES;  Surgeon: Leslye Peer, MD;  Location: [facility] ENDOSCOPY;  Service: Pulmonary;;   BRONCHIAL BRUSHINGS  05/30/2022   Procedure: BRONCHIAL BRUSHINGS;  Surgeon: Leslye Peer, MD;  Location: [facility] ENDOSCOPY;  Service: Pulmonary;;   BRONCHIAL NEEDLE ASPIRATION BIOPSY  05/30/2022  Procedure: BRONCHIAL NEEDLE ASPIRATION BIOPSIES;  Surgeon: Leslye Peer, MD;  Location: [facility] ENDOSCOPY;  Service: Pulmonary;;   BRONCHIAL WASHINGS  05/30/2022   Procedure: BRONCHIAL WASHINGS;  Surgeon:  Leslye Peer, MD;  Location: [facility] ENDOSCOPY;  Service: Pulmonary;;   ESOPHAGOGASTRODUODENOSCOPY (EGD) WITH PROPOFOL N/A 03/11/2022   Procedure: ESOPHAGOGASTRODUODENOSCOPY (EGD) WITH PROPOFOL;  Surgeon: Violeta Gelinas, MD;  Location: [facility] ENDOSCOPY;  Service: General;  Laterality: N/A;   FIDUCIAL MARKER PLACEMENT  05/30/2022   Procedure: FIDUCIAL MARKER PLACEMENT;  Surgeon: Leslye Peer, MD;  Location: [facility] ENDOSCOPY;  Service: Pulmonary;;   IR ANGIO INTRA EXTRACRAN SEL COM CAROTID INNOMINATE UNI R MOD SED  03/02/2022   IR CT HEAD LTD  03/02/2022   IR PERCUTANEOUS ART THROMBECTOMY/INFUSION INTRACRANIAL INC DIAG ANGIO  03/02/2022   PEG PLACEMENT N/A 03/11/2022   Procedure: PERCUTANEOUS ENDOSCOPIC GASTROSTOMY (PEG) PLACEMENT;  Surgeon: Violeta Gelinas, MD;  Location: [facility] ENDOSCOPY;  Service: General;  Laterality: N/A;   RADIOLOGY WITH ANESTHESIA N/A 03/02/2022   Procedure: IR WITH ANESTHESIA;  Surgeon: Julieanne Cotton, MD;  Location: [facility] OR;  Service: Radiology;  Laterality: N/A;   VIDEO BRONCHOSCOPY WITH RADIAL ENDOBRONCHIAL ULTRASOUND  05/30/2022   Procedure: VIDEO BRONCHOSCOPY WITH RADIAL ENDOBRONCHIAL ULTRASOUND;  Surgeon: Leslye Peer, MD;  Location: [facility] ENDOSCOPY;  Service: Pulmonary;;    Social History   Socioeconomic History   Marital status: Widowed    Spouse name: Not on file   Number of children: Not on file   Years of education: Not on file   Highest education level: Not on file  Occupational History   Not on file  Tobacco Use   Smoking status: Former    Current packs/day: 0.00    Types: Cigarettes    Quit date: 05/02/2022    Years since quitting: 1.1    Passive exposure: Never   Smokeless tobacco: Never  Vaping Use   Vaping status: Never Used  Substance and Sexual Activity   Alcohol use: Not Currently   Drug use: No   Sexual activity: Not on file  Other Topics Concern   Not on file  Social History Narrative   Not on file   Social Determinants of Health   Financial  Resource Strain: Not on file  Food Insecurity: No Food Insecurity (07/05/2023)   Hunger Vital Sign    Worried About Running Out of Food in the Last Year: Never true    Ran Out of Food in the Last Year: Never true  Transportation Needs: No Transportation Needs (07/05/2023)   PRAPARE - Administrator, Civil Service (Medical): No    Lack of Transportation (Non-Medical): No  Physical Activity: Not on file  Stress: Not on file  Social Connections: Socially Isolated (01/06/2023)   Social Connection and Isolation Panel [NHANES]    Frequency of Communication with Friends and Family: Once a week    Frequency of Social Gatherings with Friends and Family: Once a week    Attends Religious Services: Never    Database administrator or Organizations: No    Attends Banker Meetings: Never    Marital Status: Widowed  Intimate Partner Violence: Not At Risk (07/05/2023)   Humiliation, Afraid, Rape, and Kick questionnaire    Fear of Current or Ex-Partner: No    Emotionally Abused: No    Physically Abused: No    Sexually Abused: No    Family History  Problem Relation Age of Onset   Throat cancer Mother  Liver cancer Father    Kidney failure Sister    Cancer - Lung Paternal Uncle     Medications Prior to Admission  Medication Sig Dispense Refill Last Dose   apixaban (ELIQUIS) 5 MG TABS tablet TAKE 1 TABLET(5 MG) BY MOUTH TWICE DAILY (Patient taking differently: Take 5 mg by mouth 2 (two) times daily.) 180 tablet 2 07/03/2023 at 0930   atorvastatin (LIPITOR) 40 MG tablet Take 1 tablet (40 mg total) by mouth daily. 90 tablet 3 07/03/2023   budesonide (PULMICORT) 0.5 MG/2ML nebulizer solution NEW PRESCRIPTION REQEUST: BUDESONIDE 0.5 MG/ - USE ONE VIAL TWICE DAILY (Patient taking differently: Take 0.5 mg by nebulization in the morning and at bedtime.) 180 mL 3 Past Week   levETIRAcetam (KEPPRA) 500 MG tablet Take 1 tablet (500 mg total) by mouth 2 (two) times daily. 180 tablet 3  07/03/2023   midodrine (PROAMATINE) 2.5 MG tablet Take 1 tablet (2.5 mg total) by mouth 3 (three) times daily with meals. (Patient taking differently: Take 2.5 mg by mouth in the morning and at bedtime.) 90 tablet 5 07/03/2023   Multiple Vitamin (MULTIVITAMIN WITH MINERALS) TABS tablet Take 1 tablet by mouth daily. (Patient taking differently: Take 1 tablet by mouth in the morning and at bedtime.) 30 tablet 2 07/03/2023   ondansetron (ZOFRAN-ODT) 4 MG disintegrating tablet Take 1 tablet (4 mg total) by mouth every 8 (eight) hours as needed for up to 3 days for nausea or vomiting. 15 tablet 0 07/03/2023   oxyCODONE-acetaminophen (PERCOCET/ROXICET) 5-325 MG tablet Take 1 tablet by mouth every 6 (six) hours as needed for severe pain. (Patient taking differently: Take 1 tablet by mouth as needed for severe pain.) 12 tablet 0 07/03/2023   Tiotropium Bromide-Olodaterol (STIOLTO RESPIMAT) 2.5-2.5 MCG/ACT AERS NEW PRESCRIPTION REQUEST: STIOLTO 2.5 MCG- INHALE TWO PUFFS BY MOUTH DAILY (Patient taking differently: Inhale 2 each into the lungs daily.) 12 g 3 07/03/2023   albuterol (PROVENTIL) (2.5 MG/3ML) 0.083% nebulizer solution Take 3 mLs (2.5 mg total) by nebulization every 4 (four) hours as needed for wheezing or shortness of breath. (Patient not taking: Reported on 04/13/2023) 90 mL 12 Not Taking   folic acid (FOLVITE) 1 MG tablet Take 1 tablet (1 mg total) by mouth daily. (Patient not taking: Reported on 06/03/2023) 30 tablet 2 Not Taking   sucralfate (CARAFATE) 1 GM/10ML suspension Take 10 mLs (1 g total) by mouth 4 (four) times daily -  with meals and at bedtime. (Patient not taking: Reported on 07/04/2023) 420 mL 0 Not Taking    Current Facility-Administered Medications  Medication Dose Route Frequency Provider Last Rate Last Admin   [MAR Hold] acetaminophen (TYLENOL) tablet 650 mg  650 mg Oral Q6H PRN Bobette Mo, MD   650 mg at 07/04/23 2142   Or   [MAR Hold] acetaminophen (TYLENOL) suppository 650 mg   650 mg Rectal Q6H PRN Bobette Mo, MD       [facility] Hold] arformoterol [facility]) nebulizer solution 15 mcg  15 mcg Nebulization BID Bobette Mo, MD   15 mcg at 07/05/23 0754   And   [MAR Hold] umeclidinium bromide (INCRUSE ELLIPTA) 62.5 MCG/ACT 1 puff  1 puff Inhalation Daily Bobette Mo, MD   1 puff at 07/05/23 0754   [MAR Hold] cefTRIAXone (ROCEPHIN) 2 g in sodium chloride 0.9 % 100 mL IVPB  2 g Intravenous Q24H Bobette Mo, MD 200 mL/hr at 07/05/23 0547 2 g at 07/05/23 0547   [MAR Hold] fentaNYL (  SUBLIMAZE) injection 25 mcg  25 mcg Intravenous Q2H PRN Bobette Mo, MD       lactated ringers infusion   Intravenous Continuous Jairo Ben, MD 10 mL/hr at 07/05/23 1440 Continued from Pre-op at 07/05/23 1440   [MAR Hold] levETIRAcetam (KEPPRA) tablet 500 mg  500 mg Oral BID Bobette Mo, MD   500 mg at 07/05/23 0957   [MAR Hold] metroNIDAZOLE (FLAGYL) IVPB 500 mg  500 mg Intravenous Q12H Bobette Mo, MD 100 mL/hr at 07/05/23 0955 500 mg at 07/05/23 0955   [MAR Hold] midodrine (PROAMATINE) tablet 2.5 mg  2.5 mg Oral BID WC Bobette Mo, MD   2.5 mg at 07/05/23 0957   [MAR Hold] ondansetron (ZOFRAN) tablet 4 mg  4 mg Oral Q6H PRN Bobette Mo, MD       Or   [facility] Hold] ondansetron [facility]) injection 4 mg  4 mg Intravenous Q6H PRN Bobette Mo, MD       [facility] Hold] Oral care mouth rinse  15 mL Mouth Rinse PRN Bobette Mo, MD       [facility] Hold] pantoprazole (PROTONIX) injection 40 mg  40 mg Intravenous Q24H Bobette Mo, MD   40 mg at 07/05/23 0957   [MAR Hold] sodium phosphate (FLEET) 7-19 GM/118ML enema 1 enema  1 enema Rectal Once PRN Bobette Mo, MD         No Known Allergies  BP 115/70 (BP Location: Left Arm)   Pulse 78   Temp 99.1 F (37.3 C) (Oral)   Resp 19   Ht 6' (1.829 m)   Wt 74.8 kg   SpO2 94%   BMI 22.38 kg/m   Labs: Results for orders placed or performed during the hospital  encounter of 07/04/23 (from the past 48 hour(s))  CBC with Differential     Status: Abnormal   Collection Time: 07/04/23  1:58 AM  Result Value Ref Range   WBC 13.4 (H) 4.0 - 10.5 K/uL   RBC 4.73 4.22 - 5.81 MIL/uL   Hemoglobin 14.1 13.0 - 17.0 g/dL   HCT 84.6 96.2 - 95.2 %   MCV 93.7 80.0 - 100.0 fL   MCH 29.8 26.0 - 34.0 pg   MCHC 31.8 30.0 - 36.0 g/dL   RDW 84.1 32.4 - 40.1 %   Platelets 147 (L) 150 - 400 K/uL   nRBC 0.0 0.0 - 0.2 %   Neutrophils Relative % 85 %   Neutro Abs 11.3 (H) 1.7 - 7.7 K/uL   Lymphocytes Relative 7 %   Lymphs Abs 0.9 0.7 - 4.0 K/uL   Monocytes Relative 8 %   Monocytes Absolute 1.1 (H) 0.1 - 1.0 K/uL   Eosinophils Relative 0 %   Eosinophils Absolute 0.0 0.0 - 0.5 K/uL   Basophils Relative 0 %   Basophils Absolute 0.0 0.0 - 0.1 K/uL   Immature Granulocytes 0 %   Abs Immature Granulocytes 0.04 0.00 - 0.07 K/uL    Comment: Performed at [facility], [address] [place], [place] [address]  Comprehensive metabolic panel     Status: Abnormal   Collection Time: 07/04/23  1:58 AM  Result Value Ref Range   Sodium 138 135 - 145 mmol/L   Potassium 3.6 3.5 - 5.1 mmol/L   Chloride 102 98 - 111 mmol/L   CO2 28 22 - 32 mmol/L   Glucose, Bld 116 (H) 70 - 99 mg/dL    Comment: Glucose reference  range applies only to samples taken after fasting for at least 8 hours.   BUN 14 8 - 23 mg/dL   Creatinine, Ser 9.56 0.61 - 1.24 mg/dL   Calcium 9.3 8.9 - 21.3 mg/dL   Total Protein 8.5 (H) 6.5 - 8.1 g/dL   Albumin 4.2 3.5 - 5.0 g/dL   AST 19 15 - 41 U/L   ALT 15 0 - 44 U/L   Alkaline Phosphatase 92 38 - 126 U/L   Total Bilirubin 1.7 (H) 0.3 - 1.2 mg/dL   GFR, Estimated >08 >65 mL/min    Comment: (NOTE) Calculated using the CKD-EPI Creatinine Equation (2021)    Anion gap 8 5 - 15    Comment: Performed at [facility], [address] [place], [place] [address]  Lactic acid, plasma     Status: None   Collection Time: 07/04/23   1:58 AM  Result Value Ref Range   Lactic Acid, Venous 1.3 0.5 - 1.9 mmol/L    Comment: Performed at [facility], [address], [place], [place] [address]  Blood culture (routine x 2)     Status: None (Preliminary result)   Collection Time: 07/04/23  1:58 AM   Specimen: BLOOD  Result Value Ref Range   Specimen Description      BLOOD BLOOD LEFT ARM Performed at [facility], [address] [place], [place] [address]    Special Requests      BOTTLES DRAWN AEROBIC AND ANAEROBIC Blood Culture adequate volume Performed at [facility], [address] [place], [place] [address]    Culture      NO GROWTH 1 DAY Performed at [facility], [address] [place], [place] [address]    Report Status PENDING   Blood culture (routine x 2)     Status: None (Preliminary result)   Collection Time: 07/04/23  1:58 AM   Specimen: BLOOD  Result Value Ref Range   Specimen Description      BLOOD BLOOD RIGHT ARM Performed at [facility], [address], [place], [place] [address]    Special Requests      BOTTLES DRAWN AEROBIC AND ANAEROBIC Blood Culture results may not be optimal due to an excessive volume of blood received in culture bottles Performed at [facility], [address], [place], [place] [address]    Culture      NO GROWTH 1 DAY Performed at [facility], [address], [place], [place] [address]    Report Status PENDING   Lipase, blood     Status: None   Collection Time: 07/04/23  1:58 AM  Result Value Ref Range   Lipase 23 11 - 51 U/L    Comment: Performed at [facility], [address], [place], [place] [address]  Resp panel by RT-PCR (RSV, Flu A&B, Covid) Peripheral     Status: None   Collection Time: 07/04/23  1:58 AM   Specimen: Peripheral; Nasal Swab  Result Value Ref Range   SARS Coronavirus 2 by RT PCR NEGATIVE NEGATIVE    Comment:  (NOTE) SARS-CoV-2 target nucleic acids are NOT DETECTED.  The SARS-CoV-2 RNA is generally detectable in upper respiratory specimens during the acute phase of infection. The lowest concentration of SARS-CoV-2 viral copies this assay can detect is 138 copies/mL. A negative result does not preclude SARS-Cov-2 infection and should not be used as the sole basis for treatment  or other patient management decisions. A negative result may occur with  improper specimen collection/handling, submission of specimen other than nasopharyngeal swab, presence of viral mutation(s) within the areas targeted by this assay, and inadequate number of viral copies(<138 copies/mL). A negative result must be combined with clinical observations, patient history, and epidemiological information. The expected result is Negative.  Fact Sheet for Patients:  BloggerCourse.com  Fact Sheet for Healthcare Providers:  SeriousBroker.it  This test is no t yet approved or cleared by the [place] FDA and  has been authorized for detection and/or diagnosis of SARS-CoV-2 by FDA under an Emergency Use Authorization (EUA). This EUA will remain  in effect (meaning this test can be used) for the duration of the COVID-19 declaration under Section 564(b)(1) of the Act, 21 U.S.C.section 360bbb-3(b)(1), unless the authorization is terminated  or revoked sooner.       Influenza A by PCR NEGATIVE NEGATIVE   Influenza B by PCR NEGATIVE NEGATIVE    Comment: (NOTE) The Xpert Xpress SARS-CoV-2/FLU/RSV plus assay is intended as an aid in the diagnosis of influenza from Nasopharyngeal swab specimens and should not be used as a sole basis for treatment. Nasal washings and aspirates are unacceptable for Xpert Xpress SARS-CoV-2/FLU/RSV testing.  Fact Sheet for Patients: BloggerCourse.com  Fact Sheet for Healthcare  Providers: SeriousBroker.it  This test is not yet approved or cleared by the [place] FDA and has been authorized for detection and/or diagnosis of SARS-CoV-2 by FDA under an Emergency Use Authorization (EUA). This EUA will remain in effect (meaning this test can be used) for the duration of the COVID-19 declaration under Section 564(b)(1) of the Act, 21 U.S.C. section 360bbb-3(b)(1), unless the authorization is terminated or revoked.     Resp Syncytial Virus by PCR NEGATIVE NEGATIVE    Comment: (NOTE) Fact Sheet for Patients: BloggerCourse.com  Fact Sheet for Healthcare Providers: SeriousBroker.it  This test is not yet approved or cleared by the [place] FDA and has been authorized for detection and/or diagnosis of SARS-CoV-2 by FDA under an Emergency Use Authorization (EUA). This EUA will remain in effect (meaning this test can be used) for the duration of the COVID-19 declaration under Section 564(b)(1) of the Act, 21 U.S.C. section 360bbb-3(b)(1), unless the authorization is terminated or revoked.  Performed at [facility], [address], [place], [place] [address]   Urinalysis, w/ Reflex to Culture (Infection Suspected) -Urine, Clean Catch     Status: Abnormal   Collection Time: 07/04/23  7:35 AM  Result Value Ref Range   Specimen Source URINE, CLEAN CATCH    Color, Urine YELLOW YELLOW   APPearance CLEAR CLEAR   Specific Gravity, Urine >1.046 (H) 1.005 - 1.030   pH 6.0 5.0 - 8.0   Glucose, UA NEGATIVE NEGATIVE mg/dL   Hgb urine dipstick NEGATIVE NEGATIVE   Bilirubin Urine NEGATIVE NEGATIVE   Ketones, ur NEGATIVE NEGATIVE mg/dL   Protein, ur 30 (A) NEGATIVE mg/dL   Nitrite NEGATIVE NEGATIVE   Leukocytes,Ua NEGATIVE NEGATIVE   RBC / HPF 0-5 0 - 5 RBC/hpf   WBC, UA 0-5 0 - 5 WBC/hpf    Comment:        Reflex urine culture not performed if WBC <=10, OR if  Squamous epithelial cells >5. If Squamous epithelial cells >5 suggest recollection.    Bacteria, UA RARE (A) NONE SEEN   Squamous Epithelial / HPF 0-5 0 - 5 /HPF   Mucus PRESENT     Comment: Performed at  [facility], [address], [place], [place] [address]  Protime-INR     Status: Abnormal   Collection Time: 07/04/23  8:50 AM  Result Value Ref Range   Prothrombin Time 17.9 (H) 11.4 - 15.2 seconds   INR 1.5 (H) 0.8 - 1.2    Comment: (NOTE) INR goal varies based on device and disease states. Performed at [facility], [address], [place], [place] [address]   APTT     Status: None   Collection Time: 07/04/23  8:50 AM  Result Value Ref Range   aPTT 25 24 - 36 seconds    Comment: Performed at [facility], [address] [place], [place] [address]  Magnesium     Status: None   Collection Time: 07/04/23  8:50 AM  Result Value Ref Range   Magnesium 1.8 1.7 - 2.4 mg/dL    Comment: Performed at [facility], [address], [place], [place] [address]  Phosphorus     Status: None   Collection Time: 07/04/23  8:50 AM  Result Value Ref Range   Phosphorus 3.1 2.5 - 4.6 mg/dL    Comment: Performed at [facility], [address] [place], [place] [address]  Surgical pcr screen     Status: None   Collection Time: 07/04/23  9:37 PM   Specimen: Nasal Mucosa; Nasal Swab  Result Value Ref Range   MRSA, PCR NEGATIVE NEGATIVE   Staphylococcus aureus NEGATIVE NEGATIVE    Comment: (NOTE) The Xpert SA Assay (FDA approved for NASAL specimens in patients 74 years of age and older), is one component of a comprehensive surveillance program. It is not intended to diagnose infection nor to guide or monitor treatment. Performed at [facility], [address], [place], [place] [address]   CBC     Status: Abnormal   Collection Time: 07/05/23  5:11 AM  Result Value Ref Range    WBC 9.2 4.0 - 10.5 K/uL   RBC 3.99 (L) 4.22 - 5.81 MIL/uL   Hemoglobin 11.6 (L) 13.0 - 17.0 g/dL   HCT 70.6 (L) 23.7 - 62.8 %   MCV 93.0 80.0 - 100.0 fL   MCH 29.1 26.0 - 34.0 pg   MCHC 31.3 30.0 - 36.0 g/dL   RDW 31.5 17.6 - 16.0 %   Platelets 140 (L) 150 - 400 K/uL   nRBC 0.0 0.0 - 0.2 %    Comment: Performed at [facility], [address], [place], [place] [address]  Comprehensive metabolic panel     Status: Abnormal   Collection Time: 07/05/23  5:11 AM  Result Value Ref Range   Sodium 134 (L) 135 - 145 mmol/L   Potassium 3.6 3.5 - 5.1 mmol/L   Chloride 103 98 - 111 mmol/L   CO2 24 22 - 32 mmol/L   Glucose, Bld 108 (H) 70 - 99 mg/dL    Comment: Glucose reference range applies only to samples taken after fasting for at least 8 hours.   BUN 9 8 - 23 mg/dL   Creatinine, Ser 6.26 0.61 - 1.24 mg/dL   Calcium 8.0 (L) 8.9 - 10.3 mg/dL   Total Protein 6.2 (L) 6.5 - 8.1 g/dL   Albumin 2.9 (L) 3.5 - 5.0 g/dL   AST 20 15 - 41 U/L   ALT 19 0 - 44 U/L   Alkaline Phosphatase 65 38 - 126 U/L   Total Bilirubin 0.9 0.3 -  1.2 mg/dL   GFR, Estimated >62 >13 mL/min    Comment: (NOTE) Calculated using the CKD-EPI Creatinine Equation (2021)    Anion gap 7 5 - 15    Comment: Performed at [facility], [address] [place], [place] [address]  Lipid panel     Status: Abnormal   Collection Time: 07/05/23  5:11 AM  Result Value Ref Range   Cholesterol 80 0 - 200 mg/dL   Triglycerides 49 <846 mg/dL   HDL 37 (L) >96 mg/dL   Total CHOL/HDL Ratio 2.2 RATIO   VLDL 10 0 - 40 mg/dL   LDL Cholesterol 33 0 - 99 mg/dL    Comment:        Total Cholesterol/HDL:CHD Risk Coronary Heart Disease Risk Table                     Men   Women  1/2 Average Risk   3.4   3.3  Average Risk       5.0   4.4  2 X Average Risk   9.6   7.1  3 X Average Risk  23.4   11.0        Use the calculated Patient Ratio above and the CHD Risk Table to determine the patient's CHD  Risk.        ATP III CLASSIFICATION (LDL):  <100     mg/dL   Optimal  295-284  mg/dL   Near or Above                    Optimal  130-159  mg/dL   Borderline  132-440  mg/dL   High  >102     mg/dL   Very High Performed at [facility], [address] [place], [place] [address]     Imaging / Studies: ECHOCARDIOGRAM COMPLETE  Result Date: 07/04/2023    ECHOCARDIOGRAM REPORT   Patient Name:   Rafik VIN YONKE Date of Exam: 07/04/2023 Medical Rec #:  [id number]     Height:       72.0 in Accession #:    [id number]    Weight:       165.0 lb Date of Birth:  [birth year]     BSA:          1.963 m Patient Age:    62 years      BP:           119/73 mmHg Patient Gender: M             HR:           76 bpm. Exam Location:  Inpatient Procedure: 2D Echo, Cardiac Doppler, Color Doppler and Intracardiac            Opacification Agent Indications:    CHF I50.9  History:        Patient has prior history of Echocardiogram examinations, most                 recent 10/13/2022. CHF, Stroke, PAD and Emphysema; Risk                 Factors:Dyslipidemia and Former Smoker.  Sonographer:    Dondra Prader RVT RCS Referring Phys: [phone] Josuel MANUEL ORTIZ  Sonographer Comments: Technically challenging study due to limited acoustic windows, Technically difficult study due to poor echo windows, suboptimal parasternal window, suboptimal apical window and suboptimal subcostal window. Technically difficult exam  due to very poor  and limited windows; attempted Definity. IMPRESSIONS  1. Left ventricular ejection fraction, by estimation, is 60 to 65%. The left ventricle has normal function. Left ventricular endocardial border not optimally defined to evaluate regional wall motion. There is mild left ventricular hypertrophy. Left ventricular diastolic parameters are consistent with Grade I diastolic dysfunction (impaired relaxation).  2. Right ventricular systolic function is mildly reduced. The right ventricular size is  normal.  3. The mitral valve is normal in structure. Trivial mitral valve regurgitation. No evidence of mitral stenosis.  4. The aortic valve was not well visualized. Aortic valve regurgitation is not visualized. No aortic stenosis is present.  5. Aortic dilatation noted. There is borderline dilatation of the ascending aorta, measuring 39 mm.  6. The inferior vena cava is normal in size with greater than 50% respiratory variability, suggesting right atrial pressure of 3 mmHg.  7. Technically very limited study with poor images even with Definity contrast. LV function appears normal. Suspect RV function mildly decreased. FINDINGS  Left Ventricle: Left ventricular ejection fraction, by estimation, is 60 to 65%. The left ventricle has normal function. Left ventricular endocardial border not optimally defined to evaluate regional wall motion. Definity contrast agent was given IV to delineate the left ventricular endocardial borders. The left ventricular internal cavity size was normal in size. There is mild left ventricular hypertrophy. Left ventricular diastolic parameters are consistent with Grade I diastolic dysfunction (impaired relaxation). Right Ventricle: The right ventricular size is normal. No increase in right ventricular wall thickness. Right ventricular systolic function is mildly reduced. Left Atrium: Left atrial size was normal in size. Right Atrium: Right atrial size was normal in size. Pericardium: There is no evidence of pericardial effusion. Mitral Valve: The mitral valve is normal in structure. Trivial mitral valve regurgitation. No evidence of mitral valve stenosis. Tricuspid Valve: The tricuspid valve is not well visualized. Tricuspid valve regurgitation is trivial. No evidence of tricuspid stenosis. Aortic Valve: The aortic valve was not well visualized. Aortic valve regurgitation is not visualized. No aortic stenosis is present. Pulmonic Valve: The pulmonic valve was not well visualized. Pulmonic  valve regurgitation is not visualized. No evidence of pulmonic stenosis. Aorta: Aortic dilatation noted. There is borderline dilatation of the ascending aorta, measuring 39 mm. Venous: The inferior vena cava is normal in size with greater than 50% respiratory variability, suggesting right atrial pressure of 3 mmHg. IAS/Shunts: No atrial level shunt detected by color flow Doppler. Additional Comments: A venous catheter is visualized.  LEFT VENTRICLE PLAX 2D LVIDd:         4.80 cm   Diastology LVIDs:         3.90 cm   LV e' medial:    8.16 cm/s LV PW:         1.10 cm   LV E/e' medial:  7.1 LV IVS:        1.10 cm   LV e' lateral:   10.00 cm/s LVOT diam:     2.20 cm   LV E/e' lateral: 5.8 LV SV:         63 LV SV Index:   32 LVOT Area:     3.80 cm  RIGHT VENTRICLE             IVC RV S prime:     12.90 cm/s  IVC diam: 1.20 cm TAPSE (M-mode): 2.8 cm LEFT ATRIUM         Index LA diam:    3.60 cm 1.83 cm/m  AORTIC VALVE  PULMONIC VALVE LVOT Vmax:   84.20 cm/s  PV Vmax:       0.90 m/s LVOT Vmean:  61.900 cm/s PV Peak grad:  3.3 mmHg LVOT VTI:    0.165 m  AORTA Ao Root diam: 3.50 cm Ao Asc diam:  3.90 cm MITRAL VALVE MV Area (PHT): 2.54 cm    SHUNTS MV Decel Time: 299 msec    Systemic VTI:  0.16 m MV E velocity: 57.80 cm/s  Systemic Diam: 2.20 cm MV A velocity: 75.80 cm/s MV E/A ratio:  0.76 Arvilla Meres MD Electronically signed by Arvilla Meres MD Signature Date/Time: 07/04/2023/4:21:28 PM    Final    US Abdomen Limited RUQ (LIVER/GB)  Result Date: 07/04/2023 CLINICAL DATA:  Right upper quadrant pain. EXAM: ULTRASOUND ABDOMEN LIMITED RIGHT UPPER QUADRANT COMPARISON:  None Available. FINDINGS: Gallbladder: Multiple gallstones evident measuring up to 11 mm. Gallbladder wall thickness varies from 3-5 mm thickness. Small volume pericholecystic fluid evident. Common bile duct: Diameter: Not well seen. Liver: Coarsening of hepatic echotexture evident without a discrete or focal mass lesion evident. Suspicion  for trace intrahepatic biliary duct dilatation. Portal vein is patent on color Doppler imaging with normal direction of blood flow towards the liver. Other: None. IMPRESSION: 1. Cholelithiasis with gallbladder wall thickening and small volume pericholecystic fluid. Imaging features are concerning for acute cholecystitis. 2. Suspicion for trace intrahepatic biliary duct dilatation. Common bile duct not well seen. Electronically Signed   By: Kennith Center M.D.   On: 07/04/2023 05:40   CT ABDOMEN PELVIS W CONTRAST  Result Date: 07/04/2023 CLINICAL DATA:  Bowel obstruction suspected. Ongoing vomiting. History of lung cancer. EXAM: CT ABDOMEN AND PELVIS WITH CONTRAST TECHNIQUE: Multidetector CT imaging of the abdomen and pelvis was performed using the standard protocol following bolus administration of intravenous contrast. RADIATION DOSE REDUCTION: This exam was performed according to the departmental dose-optimization program which includes automated exposure control, adjustment of the mA and/or kV according to patient size and/or use of iterative reconstruction technique. CONTRAST:  OMNIPAQUE IOHEXOL 300 MG/ML  SOLN COMPARISON:  04/08/2023 FINDINGS: Lower chest: Reticulation in the left lower lobe. No acute finding. Hepatobiliary: No focal liver abnormality.Edematous thickening of the gallbladder which is distended. The CBD measures up to 9 mm in diameter. No calcified gallstone. Pancreas: Generalized atrophy. Spleen: Unremarkable. Adrenals/Urinary Tract: Negative adrenals. No hydronephrosis or stone. Unremarkable bladder. Stomach/Bowel:  No obstruction. No appendicitis. Vascular/Lymphatic: No acute vascular abnormality. Severe atheromatous plaque affecting the aorta and branch vessels with bilateral chronic SFA and left common iliac artery occlusions. Numerous bilateral inflow stenoses. No mass or adenopathy. Reproductive:No pathologic findings. Other: No ascites or pneumoperitoneum. Musculoskeletal: No  acute abnormalities. IMPRESSION: 1. Gallbladder distension and edema as with acute cholecystitis. No calcified gallstones, suggest ultrasound correlation. 2. Severe atherosclerosis with chronic occlusions of the left common iliac and bilateral SFA. Electronically Signed   By: Tiburcio Pea M.D.   On: 07/04/2023 04:04   CT Head Wo Contrast  Result Date: 07/04/2023 CLINICAL DATA:  Head trauma EXAM: CT HEAD WITHOUT CONTRAST CT CERVICAL SPINE WITHOUT CONTRAST TECHNIQUE: Multidetector CT imaging of the head and cervical spine was performed following the standard protocol without intravenous contrast. Multiplanar CT image reconstructions of the cervical spine were also generated. RADIATION DOSE REDUCTION: This exam was performed according to the departmental dose-optimization program which includes automated exposure control, adjustment of the mA and/or kV according to patient size and/or use of iterative reconstruction technique. 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 Old left cerebellar infarcts. Ex vacuo dilatation of the left lateral ventricle. There is hypoattenuation of the periventricular white matter, most commonly indicating chronic ischemic microangiopathy. Vascular: No abnormal hyperdensity of the major intracranial arteries or dural venous sinuses. No intracranial atherosclerosis. Skull: The visualized skull base, calvarium and extracranial soft tissues are normal. Sinuses/Orbits: No fluid levels or advanced mucosal thickening of the visualized paranasal sinuses. No mastoid or middle ear effusion. The orbits are normal. CT CERVICAL SPINE FINDINGS Alignment: No static subluxation. Facets are aligned. Occipital condyles are normally positioned. Skull base and vertebrae: No acute fracture. Soft tissues and spinal canal: No prevertebral fluid or swelling. No visible canal hematoma. Disc levels: No advanced spinal canal or  neural foraminal stenosis. Upper chest: No pneumothorax, pulmonary nodule or pleural effusion. Other: Normal visualized paraspinal cervical soft tissues. IMPRESSION: 1. No acute fracture or static subluxation of the cervical spine. 2. 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 Electronically Signed   By: Deatra Robinson M.D.   On: 07/04/2023 03:25   CT Cervical Spine Wo Contrast  Result Date: 07/04/2023 CLINICAL DATA:  Head trauma EXAM: CT HEAD WITHOUT CONTRAST CT CERVICAL SPINE WITHOUT CONTRAST TECHNIQUE: Multidetector CT imaging of the head and cervical spine was performed following the standard protocol without intravenous contrast. Multiplanar CT image reconstructions of the cervical spine were also generated. RADIATION DOSE REDUCTION: This exam was performed according to the departmental dose-optimization program which includes automated exposure control, adjustment of the mA and/or kV according to patient size and/or use of iterative reconstruction technique. 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 Ex vacuo dilatation of the left lateral ventricle. There is hypoattenuation of the periventricular white matter, most commonly indicating chronic ischemic microangiopathy. Vascular: No abnormal hyperdensity of the major intracranial arteries or dural venous sinuses. No intracranial atherosclerosis. Skull: The visualized skull base, calvarium and extracranial soft tissues are normal. Sinuses/Orbits: No fluid levels or advanced mucosal thickening of the visualized paranasal sinuses. No mastoid or middle ear effusion. The orbits are normal. CT CERVICAL SPINE FINDINGS Alignment: No static subluxation. Facets are aligned. Occipital condyles are normally positioned. Skull base and vertebrae: No acute fracture. Soft tissues and spinal canal: No prevertebral fluid or swelling. No visible  canal hematoma. Disc levels: No advanced spinal canal or neural foraminal stenosis. Upper chest: No pneumothorax, pulmonary nodule or pleural effusion. Other: Normal visualized paraspinal cervical soft tissues. IMPRESSION: 1. No acute fracture or static subluxation of the cervical spine. 2. 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 Electronically Signed   By: Deatra Robinson M.D.   On: 07/04/2023 03:25   DG Chest Port 1 View  Result Date: 07/04/2023 CLINICAL DATA:  Fever, cancer patient, vomiting EXAM: PORTABLE CHEST 1 VIEW COMPARISON:  Radiographs 06/11/2023 FINDINGS: No change from 06/11/2023. Unchanged dense bandlike consolidation in the peripheral right mid lung with biopsy clip. Bandlike fibrotic change in the mid left lung. No new focal consolidation, pleural effusion, or pneumothorax. No displaced rib fractures. Stable cardiomediastinal silhouette. Aortic atherosclerotic calcification. IMPRESSION: Unchanged consolidation/scarring in the bilateral mid lungs. Electronically Signed   By: Minerva Fester M.D.   On: 07/04/2023 01:25   DG Ribs Unilateral W/Chest Right  Result Date: 06/11/2023 CLINICAL DATA:  Fall onto rib, pain EXAM: RIGHT RIBS AND CHEST - 3+ VIEW COMPARISON:  06/03/2023 FINDINGS: No displaced fracture or other bone  lesions are seen involving the ribs. There is no evidence of pneumothorax or pleural effusion. Unchanged dense, bandlike consolidation of the peripheral right midlung with internal biopsy marking clip. Additional, less dense bandlike fibrotic change of the left midlung. No new airspace opacity. Heart size and mediastinal contours are within normal limits. IMPRESSION: 1. No displaced fracture or radiographic abnormality of the ribs to explain pain. 2. Unchanged dense, bandlike consolidation of the peripheral right midlung with internal biopsy marking clip. Additional, less dense bandlike fibrotic change of the left midlung. No new airspace opacity.  Electronically Signed   By: Jearld Lesch M.D.   On: 06/11/2023 17:01     .Ardeth Sportsman, M.D., F.A.C.S. Gastrointestinal [facility] [address], [address] [place], [place] [address] [phone] Main / Paging  07/05/2023 3:06 PM    Ardeth Sportsman

## 2023-07-05 NOTE — Anesthesia Preprocedure Evaluation (Addendum)
Anesthesia Evaluation  Patient identified by MRN, date of birth, ID band Patient awake    Reviewed: Allergy & Precautions, NPO status , Patient's Chart, lab work & pertinent test results  History of Anesthesia Complications Negative for: history of anesthetic complications  Airway Mallampati: II  TM Distance: >3 FB Neck ROM: Full    Dental  (+) Edentulous Upper, Edentulous Lower   Pulmonary COPD, Patient abstained from smoking., former smoker H/o trach: closed now   breath sounds clear to auscultation       Cardiovascular hypertension, + Peripheral Vascular Disease   Rhythm:Regular Rate:Normal  Mitodrine BP support  07/04/2023 ECHO: EF 60-65%, normal LVF, mild LVH, Grade 1 DD, mildly reduced RVF, trivial MR   Neuro/Psych CVA (aphasic)    GI/Hepatic Neg liver ROS,GERD  Medicated and Controlled,,  Endo/Other  negative endocrine ROS    Renal/GU negative Renal ROS     Musculoskeletal   Abdominal   Peds  Hematology Hb 11.6, plt 140k Eliquis   Anesthesia Other Findings   Reproductive/Obstetrics                             Anesthesia Physical Anesthesia Plan  ASA: 3  Anesthesia Plan: General   Post-op Pain Management: Tylenol PO (pre-op)*   Induction: Intravenous  PONV Risk Score and Plan: 2 and Ondansetron, Dexamethasone and Treatment may vary due to age or medical condition  Airway Management Planned:   Additional Equipment: None  Intra-op Plan:   Post-operative Plan: Extubation in OR  Informed Consent: I have reviewed the patients History and Physical, chart, labs and discussed the procedure including the risks, benefits and alternatives for the proposed anesthesia with the patient or authorized representative who has indicated his/her understanding and acceptance.   Patient has DNR.  Discussed DNR with power of attorney, Discussed DNR with patient and Suspend DNR.   Consent  reviewed with POA and History available from chart only  Plan Discussed with: CRNA and Surgeon  Anesthesia Plan Comments:        Anesthesia Quick Evaluation

## 2023-07-05 NOTE — Plan of Care (Signed)
   Problem: Pain Managment: Goal: General experience of comfort will improve Outcome: Progressing   Problem: Safety: Goal: Ability to remain free from injury will improve Outcome: Progressing   Problem: Skin Integrity: Goal: Risk for impaired skin integrity will decrease Outcome: Progressing   

## 2023-07-05 NOTE — Anesthesia Procedure Notes (Signed)
Procedure Name: Intubation Date/Time: 07/05/2023 3:54 PM  Performed by: Shanon Payor, CRNAPre-anesthesia Checklist: Patient identified, Emergency Drugs available, Suction available, Patient being monitored and Timeout performed Patient Re-evaluated:Patient Re-evaluated prior to induction Oxygen Delivery Method: Circle system utilized Preoxygenation: Pre-oxygenation with 100% oxygen Induction Type: IV induction Ventilation: Mask ventilation without difficulty Laryngoscope Size: Mac and 3 Grade View: Grade I Tube type: Oral Tube size: 7.0 mm Number of attempts: 1 Airway Equipment and Method: Stylet Placement Confirmation: ETT inserted through vocal cords under direct vision, positive ETCO2, CO2 detector and breath sounds checked- equal and bilateral Secured at: 23 cm Tube secured with: Tape Dental Injury: Teeth and Oropharynx as per pre-operative assessment

## 2023-07-05 NOTE — Progress Notes (Signed)
Progress Note  Patient Name: MOMEN HAM Date of Encounter: 07/05/2023 Primary Cardiologist: Christell Constant, MD   Subjective   Patient notes No CP, SOB, Palpitations  Vital Signs    Vitals:   07/04/23 2115 07/05/23 0159 07/05/23 0500 07/05/23 0754  BP:   115/65   Pulse:   78   Resp:   20   Temp: (!) 100.6 F (38.1 C) 99 F (37.2 C) 98.7 F (37.1 C)   TempSrc: Oral Oral Oral   SpO2:   96% 97%  Weight:      Height:        Intake/Output Summary (Last 24 hours) at 07/05/2023 0936 Last data filed at 07/05/2023 0526 Gross per 24 hour  Intake 1921.99 ml  Output 3550 ml  Net -1628.01 ml   Filed Weights   07/04/23 0017  Weight: 74.8 kg    Physical Exam   GEN: No acute distress.   Neck: No JVD Cardiac: RRR, no murmurs, rubs, or gallops.  Respiratory: Clear to auscultation bilaterally. GI: Soft, nontender, non-distended  MS: No edema  Labs   Telemetry: SR with PVCs   Chemistry Recent Labs  Lab 07/02/23 0043 07/04/23 0158 07/05/23 0511  NA 139 138 134*  K 3.6 3.6 3.6  CL 102 102 103  CO2 28 28 24   GLUCOSE 128* 116* 108*  BUN 16 14 9   CREATININE 0.87 0.95 0.72  CALCIUM 9.0 9.3 8.0*  PROT 7.7 8.5* 6.2*  ALBUMIN 3.9 4.2 2.9*  AST 17 19 20   ALT 13 15 19   ALKPHOS 90 92 65  BILITOT 0.5 1.7* 0.9  GFRNONAA >60 >60 >60  ANIONGAP 9 8 7      Hematology Recent Labs  Lab 07/02/23 0043 07/04/23 0158 07/05/23 0511  WBC 8.3 13.4* 9.2  RBC 4.59 4.73 3.99*  HGB 13.4 14.1 11.6*  HCT 42.6 44.3 37.1*  MCV 92.8 93.7 93.0  MCH 29.2 29.8 29.1  MCHC 31.5 31.8 31.3  RDW 13.4 13.6 13.3  PLT 156 147* 140*    Cardiac EnzymesNo results for input(s): "TROPONINI" in the last 168 hours. No results for input(s): "TROPIPOC" in the last 168 hours.   BNPNo results for input(s): "BNP", "PROBNP" in the last 168 hours.   DDimer No results for input(s): "DDIMER" in the last 168 hours.   Cardiac Studies   Cardiac Studies & Procedures        ECHOCARDIOGRAM  ECHOCARDIOGRAM COMPLETE 07/04/2023  Narrative ECHOCARDIOGRAM REPORT    Patient Name:   JASMINE MACEACHERN Date of Exam: 07/04/2023 Medical Rec #:  [id number]     Height:       72.0 in Accession #:    [id number]    Weight:       165.0 lb Date of Birth:  22-Jun-1961     BSA:          1.963 m Patient Age:    62 years      BP:           119/73 mmHg Patient Gender: M             HR:           76 bpm. Exam Location:  Inpatient  Procedure: 2D Echo, Cardiac Doppler, Color Doppler and Intracardiac Opacification Agent  Indications:    CHF I50.9  History:        Patient has prior history of Echocardiogram examinations, most recent 10/13/2022. CHF, Stroke, PAD and Emphysema; Risk Factors:Dyslipidemia and Former Smoker.  Sonographer:    Dondra Prader RVT RCS Referring Phys: [phone] [place] MANUEL ORTIZ   Sonographer Comments: Technically challenging study due to limited acoustic windows, Technically difficult study due to poor echo windows, suboptimal parasternal window, suboptimal apical window and suboptimal subcostal window. Technically difficult exam due to very poor and limited windows; attempted Definity. IMPRESSIONS   1. Left ventricular ejection fraction, by estimation, is 60 to 65%. The left ventricle has normal function. Left ventricular endocardial border not optimally defined to evaluate regional wall motion. There is mild left ventricular hypertrophy. Left ventricular diastolic parameters are consistent with Grade I diastolic dysfunction (impaired relaxation). 2. Right ventricular systolic function is mildly reduced. The right ventricular size is normal. 3. The mitral valve is normal in structure. Trivial mitral valve regurgitation. No evidence of mitral stenosis. 4. The aortic valve was not well visualized. Aortic valve regurgitation is not visualized. No aortic stenosis is present. 5. Aortic dilatation noted. There is borderline dilatation of the ascending aorta,  measuring 39 mm. 6. The inferior vena cava is normal in size with greater than 50% respiratory variability, suggesting right atrial pressure of 3 mmHg. 7. Technically very limited study with poor images even with Definity contrast. LV function appears normal. Suspect RV function mildly decreased.  FINDINGS Left Ventricle: Left ventricular ejection fraction, by estimation, is 60 to 65%. The left ventricle has normal function. Left ventricular endocardial border not optimally defined to evaluate regional wall motion. Definity contrast agent was given IV to delineate the left ventricular endocardial borders. The left ventricular internal cavity size was normal in size. There is mild left ventricular hypertrophy. Left ventricular diastolic parameters are consistent with Grade I diastolic dysfunction (impaired relaxation).  Right Ventricle: The right ventricular size is normal. No increase in right ventricular wall thickness. Right ventricular systolic function is mildly reduced.  Left Atrium: Left atrial size was normal in size.  Right Atrium: Right atrial size was normal in size.  Pericardium: There is no evidence of pericardial effusion.  Mitral Valve: The mitral valve is normal in structure. Trivial mitral valve regurgitation. No evidence of mitral valve stenosis.  Tricuspid Valve: The tricuspid valve is not well visualized. Tricuspid valve regurgitation is trivial. No evidence of tricuspid stenosis.  Aortic Valve: The aortic valve was not well visualized. Aortic valve regurgitation is not visualized. No aortic stenosis is present.  Pulmonic Valve: The pulmonic valve was not well visualized. Pulmonic valve regurgitation is not visualized. No evidence of pulmonic stenosis.  Aorta: Aortic dilatation noted. There is borderline dilatation of the ascending aorta, measuring 39 mm.  Venous: The inferior vena cava is normal in size with greater than 50% respiratory variability, suggesting right  atrial pressure of 3 mmHg.  IAS/Shunts: No atrial level shunt detected by color flow Doppler.  Additional Comments: A venous catheter is visualized.   LEFT VENTRICLE PLAX 2D LVIDd:         4.80 cm   Diastology LVIDs:         3.90 cm   LV e' medial:    8.16 cm/s LV PW:         1.10 cm   LV E/e' medial:  7.1 LV IVS:        1.10 cm   LV e' lateral:   10.00 cm/s LVOT diam:     2.20 cm   LV E/e' lateral: 5.8 LV SV:         63 LV SV Index:   32 LVOT Area:  3.80 cm   RIGHT VENTRICLE             IVC RV S prime:     12.90 cm/s  IVC diam: 1.20 cm TAPSE (M-mode): 2.8 cm  LEFT ATRIUM         Index LA diam:    3.60 cm 1.83 cm/m AORTIC VALVE             PULMONIC VALVE LVOT Vmax:   84.20 cm/s  PV Vmax:       0.90 m/s LVOT Vmean:  61.900 cm/s PV Peak grad:  3.3 mmHg LVOT VTI:    0.165 m  AORTA Ao Root diam: 3.50 cm Ao Asc diam:  3.90 cm  MITRAL VALVE MV Area (PHT): 2.54 cm    SHUNTS MV Decel Time: 299 msec    Systemic VTI:  0.16 m MV E velocity: 57.80 cm/s  Systemic Diam: 2.20 cm MV A velocity: 75.80 cm/s MV E/A ratio:  0.76  Arvilla Meres MD Electronically signed by Arvilla Meres MD Signature Date/Time: 07/04/2023/4:21:28 PM    Final             Assessment & Plan   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      Riley Lam, MD FASE [facility] Cardiologist [facility]  [address] [place], [address] [place], [place] [address] [phone]  9:36 AM

## 2023-07-06 ENCOUNTER — Encounter (HOSPITAL_COMMUNITY)
Admission: EM | Disposition: A | Discharge status: Home / Self Care | Payer: Self-pay | Source: Home / Self Care | Attending: Internal Medicine

## 2023-07-06 ENCOUNTER — Encounter (HOSPITAL_COMMUNITY): Payer: Self-pay | Admitting: Surgery

## 2023-07-06 ENCOUNTER — Inpatient Hospital Stay (HOSPITAL_COMMUNITY): Payer: Medicaid Other | Admitting: Anesthesiology

## 2023-07-06 ENCOUNTER — Ambulatory Visit: Payer: Medicaid Other | Admitting: Radiation Oncology

## 2023-07-06 ENCOUNTER — Ambulatory Visit: Payer: Medicaid Other

## 2023-07-06 ENCOUNTER — Inpatient Hospital Stay (HOSPITAL_COMMUNITY): Payer: Medicaid Other

## 2023-07-06 DIAGNOSIS — Z87891 Personal history of nicotine dependence: Secondary | ICD-10-CM | POA: Diagnosis not present

## 2023-07-06 DIAGNOSIS — K8050 Calculus of bile duct without cholangitis or cholecystitis without obstruction: Secondary | ICD-10-CM | POA: Diagnosis not present

## 2023-07-06 DIAGNOSIS — K810 Acute cholecystitis: Secondary | ICD-10-CM | POA: Diagnosis not present

## 2023-07-06 DIAGNOSIS — I10 Essential (primary) hypertension: Secondary | ICD-10-CM | POA: Diagnosis not present

## 2023-07-06 DIAGNOSIS — J449 Chronic obstructive pulmonary disease, unspecified: Secondary | ICD-10-CM

## 2023-07-06 HISTORY — PX: REMOVAL OF STONES: SHX5545

## 2023-07-06 HISTORY — PX: ERCP: SHX5425

## 2023-07-06 HISTORY — PX: SPHINCTEROTOMY: SHX5544

## 2023-07-06 LAB — COMPREHENSIVE METABOLIC PANEL
ALT: 53 U/L — ABNORMAL HIGH (ref 0–44)
AST: 78 U/L — ABNORMAL HIGH (ref 15–41)
Albumin: 2.9 g/dL — ABNORMAL LOW (ref 3.5–5.0)
Alkaline Phosphatase: 81 U/L (ref 38–126)
Anion gap: 9 (ref 5–15)
BUN: 12 mg/dL (ref 8–23)
CO2: 29 mmol/L (ref 22–32)
Calcium: 8.6 mg/dL — ABNORMAL LOW (ref 8.9–10.3)
Chloride: 100 mmol/L (ref 98–111)
Creatinine, Ser: 0.82 mg/dL (ref 0.61–1.24)
GFR, Estimated: >60 mL/min (ref >60)
Glucose, Bld: 153 mg/dL — ABNORMAL HIGH (ref 70–99)
Potassium: 3.9 mmol/L (ref 3.5–5.1)
Sodium: 138 mmol/L (ref 135–145)
Total Bilirubin: 0.5 mg/dL (ref 0.3–1.2)
Total Protein: 6.8 g/dL (ref 6.5–8.1)

## 2023-07-06 LAB — CBC
HCT: 39.7 % (ref 39.0–52.0)
Hemoglobin: 12.6 g/dL — ABNORMAL LOW (ref 13.0–17.0)
MCH: 29.2 pg (ref 26.0–34.0)
MCHC: 31.7 g/dL (ref 30.0–36.0)
MCV: 92.1 fL (ref 80.0–100.0)
Platelets: 148 10*3/uL — ABNORMAL LOW (ref 150–400)
RBC: 4.31 MIL/uL (ref 4.22–5.81)
RDW: 13.2 % (ref 11.5–15.5)
WBC: 8.7 10*3/uL (ref 4.0–10.5)
nRBC: 0 % (ref 0.0–0.2)

## 2023-07-06 LAB — PROTIME-INR
INR: 1.3 — ABNORMAL HIGH (ref 0.8–1.2)
Prothrombin Time: 16.5 seconds — ABNORMAL HIGH (ref 11.4–15.2)

## 2023-07-06 SURGERY — ERCP, WITH INTERVENTION IF INDICATED
Anesthesia: General

## 2023-07-06 MED ORDER — LACTATED RINGERS IV SOLN: INTRAVENOUS | Status: DC | PRN

## 2023-07-06 MED ORDER — DICLOFENAC SUPPOSITORY 100 MG
RECTAL | Status: DC | PRN
  Administered 2023-07-06: 100 mg via RECTAL

## 2023-07-06 MED ORDER — PHENYLEPHRINE HCL-NACL 20-0.9 MG/250ML-% IV SOLN
INTRAVENOUS | Status: DC | PRN
  Administered 2023-07-06: 25 ug/min via INTRAVENOUS

## 2023-07-06 MED ORDER — ROCURONIUM BROMIDE 100 MG/10ML IV SOLN
INTRAVENOUS | Status: DC | PRN
  Administered 2023-07-06: 50 mg via INTRAVENOUS

## 2023-07-06 MED ORDER — PHENYLEPHRINE HCL (PRESSORS) 10 MG/ML IV SOLN
INTRAVENOUS | Status: AC
  Filled 2023-07-06: qty 1

## 2023-07-06 MED ORDER — DICLOFENAC SUPPOSITORY 100 MG
RECTAL | Status: AC
  Filled 2023-07-06: qty 1

## 2023-07-06 MED ORDER — PROPOFOL 10 MG/ML IV BOLUS
INTRAVENOUS | Status: DC | PRN
  Administered 2023-07-06: 100 mg via INTRAVENOUS

## 2023-07-06 MED ORDER — SUGAMMADEX SODIUM 200 MG/2ML IV SOLN
INTRAVENOUS | Status: DC | PRN
  Administered 2023-07-06: 200 mg via INTRAVENOUS

## 2023-07-06 MED ORDER — CIPROFLOXACIN IN D5W 400 MG/200ML IV SOLN
INTRAVENOUS | Status: DC | PRN
  Administered 2023-07-06: 400 mg via INTRAVENOUS

## 2023-07-06 MED ORDER — GLUCAGON HCL RDNA (DIAGNOSTIC) 1 MG IJ SOLR
INTRAMUSCULAR | Status: AC
  Filled 2023-07-06: qty 2

## 2023-07-06 MED ORDER — FENTANYL CITRATE (PF) 100 MCG/2ML IJ SOLN
INTRAMUSCULAR | Status: AC
  Filled 2023-07-06: qty 2

## 2023-07-06 MED ORDER — PHENYLEPHRINE HCL (PRESSORS) 10 MG/ML IV SOLN
INTRAVENOUS | Status: DC | PRN
  Administered 2023-07-06 (×5): 80 ug via INTRAVENOUS

## 2023-07-06 MED ORDER — CIPROFLOXACIN IN D5W 400 MG/200ML IV SOLN
INTRAVENOUS | Status: AC
  Filled 2023-07-06: qty 200

## 2023-07-06 MED ORDER — FENTANYL CITRATE (PF) 100 MCG/2ML IJ SOLN
INTRAMUSCULAR | Status: DC | PRN
  Administered 2023-07-06: 25 ug via INTRAVENOUS

## 2023-07-06 MED ORDER — ONDANSETRON HCL 4 MG/2ML IJ SOLN
INTRAMUSCULAR | Status: DC | PRN
  Administered 2023-07-06: 4 mg via INTRAVENOUS

## 2023-07-06 MED ORDER — SODIUM CHLORIDE 0.9 % IV SOLN
INTRAVENOUS | Status: DC | PRN
  Administered 2023-07-06: 20 mL

## 2023-07-06 MED ORDER — LIDOCAINE HCL (CARDIAC) PF 100 MG/5ML IV SOSY
PREFILLED_SYRINGE | INTRAVENOUS | Status: DC | PRN
  Administered 2023-07-06: 80 mg via INTRAVENOUS

## 2023-07-06 MED ORDER — LACTATED RINGERS IV SOLN
INTRAVENOUS | Status: AC | PRN
  Administered 2023-07-06: 175 mL via INTRAVENOUS

## 2023-07-06 NOTE — Transfer of Care (Signed)
Immediate Anesthesia Transfer of Care Note  Patient: Todd Estrada  Procedure(s) Performed: ENDOSCOPIC RETROGRADE CHOLANGIOPANCREATOGRAPHY (ERCP) SPHINCTEROTOMY REMOVAL OF STONES  Patient Location: PACU  Anesthesia Type:General  Level of Consciousness: awake and alert   Airway & Oxygen Therapy: Patient Spontanous Breathing and Patient connected to face mask oxygen  Post-op Assessment: Report given to RN and Post -op Vital signs reviewed and stable  Post vital signs: Reviewed and stable  Last Vitals:  Vitals Value Taken Time  BP 101/65 07/06/23 1542  Temp    Pulse 78 07/06/23 1543  Resp 20 07/06/23 1543  SpO2 100 % 07/06/23 1543  Vitals shown include unfiled device data.  Last Pain:  Vitals:   07/06/23 1359  TempSrc: Temporal  PainSc: 0-No pain      Patients Stated Pain Goal: 0 (07/04/23 0036)  Complications: No notable events documented.

## 2023-07-06 NOTE — Anesthesia Procedure Notes (Addendum)
Procedure Name: Intubation Date/Time: 07/06/2023 3:09 PM  Performed by: Collene Schlichter, MDPre-anesthesia Checklist: Patient identified, Emergency Drugs available, Suction available and Patient being monitored Patient Re-evaluated:Patient Re-evaluated prior to induction Oxygen Delivery Method: Circle system utilized Preoxygenation: Pre-oxygenation with 100% oxygen Induction Type: IV induction Ventilation: Mask ventilation without difficulty Laryngoscope Size: Glidescope and 4 Grade View: Grade I Tube type: Oral Laser Tube: Cuffed inflated with minimal occlusive pressure - saline Tube size: 7.5 mm Number of attempts: 1 Airway Equipment and Method: Stylet and Oral airway Placement Confirmation: ETT inserted through vocal cords under direct vision, positive ETCO2 and breath sounds checked- equal and bilateral Secured at: 23 cm Tube secured with: Tape Dental Injury: Teeth and Oropharynx as per pre-operative assessment

## 2023-07-06 NOTE — Progress Notes (Signed)
PROGRESS NOTE  Todd Estrada  DOB: 1961/03/09  PCP: Todd Fabian, MD RUE:[id number]  DOA: 07/04/2023  LOS: 2 days  Hospital Day: 3  Brief narrative: Todd Estrada is a 62 y.o. 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  Labs unremarkable.  Patient was discharged home with symptomatic management. 7/16, patient presented to the ED with complaint of multiple episodes of vomiting leading to dizziness and fall.  In the ED, patient had a fever of 200.7, heart rate 115, blood pressure 90s later improved Labs with WBC count 13.4, lactic acid 1.3, BMP unremarkable, lipase level normal Respiratory virus panel negative CT chest showed old left MCA territory infarct and old left cerebellar infarcts.  CT abdomen pelvis showed gallbladder distension and edema as with acute cholecystitis. No calcified gallstones, suggest ultrasound correlation. RUQ ultrasound showed cholelithiasis with gallbladder wall thickening and small volume of pericholecystic fluid concerning for acute cholecystitis.  Suspicion for trace intrahepatic biliary duct dilatation.  CBD was not well-seen. Patient was started on IV fluid, IV antibiotics Admitted to [facility] General surgery recommended cholecystectomy. Seen by cardiology for preoperative risk assessment.  Subjective: Patient was seen and examined this morning.   Lying on bed.  Not in distress.  No family at bedside.    Assessment and plan: Acute calculus cholecystitis S/p lap chole with IOC -Dr. Michaell Cowing 7/17 Obstructing choledocholithiasis Per operative note, IOC is concerning for obstructing choledocholithiasis.   [name] tentatively plans for ERCP this afternoon. Noted mild elevation in LFTs this morning, hepatocellular pattern Surgical JP drain in  place draining serosanguineous fluid. Currently on IV Zosyn, plan for total of 5 days. Continue IV fluid, IV analgesics, IV antiemetics Recent Labs  Lab 07/02/23 0043 07/04/23 0158 07/05/23 0511 07/06/23 0550  AST 17 19 20  78*  ALT 13 15 19  53*  ALKPHOS 90 92 65 81  BILITOT 0.5 1.7* 0.9 0.5  PROT 7.7 8.5* 6.2* 6.8  ALBUMIN 3.9 4.2 2.9* 2.9*    History of PE Eliquis on hold.  Was switched to heparin drip which is also currently on hold for procedures.    H/o ischemic left MCA stroke HLD Residual aphasia and right-sided hemiparesis Continue supportive care Lipitor on hold.  COPD Respiratory status stable.  Echo without evidence of RV dysfunction or severe PH   History of seizures Continue levetiracetam 500 mg p.o. twice daily.  Chronic hypotension Continue midodrine 2.5 mg p.o. twice daily.   Mobility: PT eval postprocedure will be required.  Goals of care   Code Status: DNR     DVT prophylaxis:  SCDs Start: 07/04/23 1478   Antimicrobials: IV Rocephin, Flagyl Fluid: None currently Consultants: Surgery, cardiology Family Communication: None at bedside  Status: Inpatient Level of care:  Telemetry   Patient from: Home Anticipated d/c to: Pending clinical course Needs to continue in-hospital care:  Pending ERCP today    Diet:  Diet Order             Diet NPO time specified Except for: Sips with Meds, Ice Chips  Diet effective midnight                   Scheduled Meds:  arformoterol  15 mcg Nebulization BID   And   umeclidinium bromide  1 puff Inhalation Daily  levETIRAcetam  500 mg Oral BID   midodrine  2.5 mg Oral BID WC   pantoprazole (PROTONIX) IV  40 mg Intravenous Q24H    PRN meds: acetaminophen **OR** acetaminophen, fentaNYL (SUBLIMAZE) injection, ondansetron **OR** ondansetron (ZOFRAN) IV, mouth rinse, sodium phosphate   Infusions:   piperacillin-tazobactam (ZOSYN)  IV 3.375 g (07/06/23 2440)     Antimicrobials: Anti-infectives (From admission, onward)    Start     Dose/Rate Route Frequency Ordered Stop   07/05/23 1830  piperacillin-tazobactam (ZOSYN) IVPB 3.375 g        3.375 g 12.5 mL/hr over 240 Minutes Intravenous Every 8 hours 07/05/23 1823 07/10/23 2159   07/05/23 0600  cefTRIAXone (ROCEPHIN) 2 g in sodium chloride 0.9 % 100 mL IVPB  Status:  Discontinued        2 g 200 mL/hr over 30 Minutes Intravenous Every 24 hours 07/04/23 0719 07/05/23 1823   07/04/23 0800  metroNIDAZOLE (FLAGYL) IVPB 500 mg  Status:  Discontinued        500 mg 100 mL/hr over 60 Minutes Intravenous Every 12 hours 07/04/23 0719 07/05/23 1823   07/04/23 0600  cefTRIAXone (ROCEPHIN) 2 g in sodium chloride 0.9 % 100 mL IVPB        2 g 200 mL/hr over 30 Minutes Intravenous  Once 07/04/23 0551 07/04/23 0710       Nutritional status:  Body mass index is 22.38 kg/m.          Objective: Vitals:   07/06/23 0843 07/06/23 1156  BP:  124/73  Pulse:    Resp:  20  Temp:  98.7 F (37.1 C)  SpO2: 95% 100%    Intake/Output Summary (Last 24 hours) at 07/06/2023 1207 Last data filed at 07/06/2023 0700 Gross per 24 hour  Intake 770 ml  Output 1925 ml  Net -1155 ml   Filed Weights   07/04/23 0017 07/05/23 1429  Weight: 74.8 kg 74.8 kg   Weight change:  Body mass index is 22.38 kg/m.   Physical Exam: General exam: Pleasant, middle-aged Caucasian male.  Looks older for his age Skin: No rashes, lesions or ulcers. HEENT: Atraumatic, normocephalic, no obvious bleeding Lungs: Clear to auscultation bilaterally CVS: Regular rate and rhythm, no murmur GI/Abd soft, appropriate postop tenderness.  JP drain with serosanguineous drainage.  Bowel sounds sluggish. CNS: Alert, awake, mumbles only.  Aphasic since last stroke. Psychiatry: Mumbles on a.  Unable to have a meaningful conversation Extremities: No pedal edema, no calf tenderness  Data Review: I have personally reviewed the laboratory data  and studies available.  F/u labs ordered Unresulted Labs (From admission, onward)     Start     Ordered   07/06/23 0500  CBC  Daily,   R      07/05/23 1823   07/05/23 0500  CBC  Daily,   R      07/04/23 0724   07/05/23 0500  Comprehensive metabolic panel  Daily,   R      07/04/23 0724            Total time spent in review of labs and imaging, patient evaluation, formulation of plan, documentation and communication with family: 45 minutes  Signed, Lorin Glass, MD [facility] 07/06/2023

## 2023-07-06 NOTE — Op Note (Signed)
[facility] Patient Name: Todd Estrada Procedure Date: 07/06/2023 MRN: [id number] Attending MD: Vida Rigger , MD, [id number] Date of Birth: 1960/12/25 CSN: [id number] Age: 62 Admit Type: Inpatient Procedure:                ERCP Indications:              Common bile duct stone(s) on IntraOp cholangiogram Providers:                Vida Rigger, MD, Fransisca Connors, Marge Duncans,                            RN, Beryle Beams, Technician Referring MD:              Medicines:                General Anesthesia Complications:            No immediate complications. Estimated Blood Loss:     Estimated blood loss: none. Procedure:                Pre-Anesthesia Assessment:                           - Prior to the procedure, a History and Physical                            was performed, and patient medications and                            allergies were reviewed. The patient's tolerance of                            previous anesthesia was also reviewed. The risks                            and benefits of the procedure and the sedation                            options and risks were discussed with the patient.                            All questions were answered, and informed consent                            was obtained. Prior Anticoagulants: The patient has                            taken Eliquis (apixaban), last dose was 3 days                            prior to procedure. ASA Grade Assessment: III - A                            patient with severe systemic disease. After  reviewing the risks and benefits, the patient was                            deemed in satisfactory condition to undergo the                            procedure.                           After obtaining informed consent, the scope was                            passed under direct vision. Throughout the                            procedure, the patient's blood pressure,  pulse, and                            oxygen saturations were monitored continuously. The                            [id number]-Q190V ([id number] Olympus duodenoscope was                            introduced through the mouth, and used to inject                            contrast into and used to cannulate the bile duct.                            The ERCP was accomplished without difficulty. The                            patient tolerated the procedure well. Scope In: Scope Out: Findings:      The major papilla was normal. Deep selective cannulation was obtained on       the first attempt and a few small stones were confirmed on initial       cholangiogram and there was no pancreatic duct injection or wire       advancement throughout the procedure and we proceeded with a biliary       sphincterotomy which was made with a Hydratome sphincterotome using ERBE       electrocautery. We proceeded until we had adequate biliary drainage and       could get the fully bowed sphincterotome easily in and out of the duct       and there was no post-sphincterotomy bleeding. To discover objects, the       biliary tree was swept with an adjustable 12- 15 mm balloon starting at       the bifurcation. Three stones were removed. No stones remained on       subsequent balloon pull-through's and occlusion cholangiogram at the end       of the procedure and both size balloons passed readily through the       patent sphincterotomy site. And at the end of the procedure there was  sluggish but positive biliary drainage and the wire balloon and scope       were all removed and the patient tolerated the procedure well Impression:               - The major papilla appeared normal.                           - Choledocholithiasis was found. Complete removal                            was accomplished by biliary sphincterotomy and                            balloon extraction.                           - A biliary  sphincterotomy was performed.                           - The biliary tree was swept multiple times. No                            residual stone seen on occlusion cholangiogram at                            the end of the procedure and no pancreatic duct                            injection or wire advancement throughout the                            procedure Moderate Sedation:      Not Applicable - Patient had care per Anesthesia. Recommendation:           - Clear liquid diet today. If doing well in a.m.                            may have soft solids                           - Resume Eliquis (apixaban) at prior dose in 2 days                            if okay with surgical team and okay with me to use                            subcu Lovenox or heparin.                           - Return to GI clinic PRN. Will ask rounding                            partner to check on tomorrow                           -  Telephone GI clinic if symptomatic PRN. Procedure Code(s):        --- Professional ---                           [phone], Endoscopic retrograde                            cholangiopancreatography (ERCP); with removal of                            calculi/debris from biliary/pancreatic duct(s)                           43262, Endoscopic retrograde                            cholangiopancreatography (ERCP); with                            sphincterotomy/papillotomy Diagnosis Code(s):        --- Professional ---                           K80.50, Calculus of bile duct without cholangitis                            or cholecystitis without obstruction CPT copyright 2022 [employer]. All rights reserved. The codes documented in this report are preliminary and upon coder review may  be revised to meet current compliance requirements. Vida Rigger, MD 07/06/2023 3:45:40 PM This report has been signed electronically. Number of Addenda: 0

## 2023-07-06 NOTE — Anesthesia Postprocedure Evaluation (Signed)
Anesthesia Post Note  Patient: Todd Estrada  Procedure(s) Performed: ENDOSCOPIC RETROGRADE CHOLANGIOPANCREATOGRAPHY (ERCP) SPHINCTEROTOMY REMOVAL OF STONES     Patient location during evaluation: Endoscopy Anesthesia Type: General Level of consciousness: awake and alert Pain management: pain level controlled Vital Signs Assessment: post-procedure vital signs reviewed and stable Respiratory status: spontaneous breathing, nonlabored ventilation, respiratory function stable and patient connected to nasal cannula oxygen Cardiovascular status: blood pressure returned to baseline and stable Postop Assessment: no apparent nausea or vomiting Anesthetic complications: no   No notable events documented.  Last Vitals:  Vitals:   07/06/23 1640 07/06/23 1650  BP: (!) 103/59 (!) 110/59  Pulse: 65 65  Resp: 12 14  Temp:    SpO2: 99% 99%    Last Pain:  Vitals:   07/06/23 1650  TempSrc:   PainSc: 0-No pain                 Collene Schlichter

## 2023-07-06 NOTE — Consult Note (Signed)
[facility] Gastroenterology Consult  Referring Provider: No ref. provider found Primary Care Physician:  [name], MD Primary Gastroenterologist: Gentry Fitz  Reason for Consultation: Concern for choledocholithiasis  SUBJECTIVE:   HPI: Todd Estrada is a 62 y.o. 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  Presented to emergency department on 07/04/2023 for nausea, vomiting, fever and fall.  Abdominal ultrasound imaging on 07/04/2023 showed cholelithiasis, gallbladder wall thickening, small pericholecystic fluid, trace intrahepatic biliary ductal dilatation.  Patient underwent laparoscopic cholecystectomy on 07/05/2023 with general surgery.  Intraoperative cholangiogram showed incompletely obstructing choledocholithiasis, mild biliary ductal dilatation.  Gastroenterology was contacted to evaluate for ERCP.  Liver enzymes today AST/ALT 78/53, ALP 81, total bilirubin 0.5.  Hemoglobin 12.6, WBC 8.7, platelet 148.  Patient is currently on Zosyn.  He has been n.p.o. since midnight on 07/06/2023.  Patient is able to understand conversation though only able to express in yes/no answers and with shaking of head.  He denied chest pain, shortness of breath, abdominal pain, nausea and vomiting.  I discussed findings of choledocholithiasis with patient, he verbalized understanding.  I recommended he have ERCP for stone extraction today, discussed procedure in detail including benefits, alternatives and risks of bleeding/infection/perforation/missed lesion/anesthesia/pancreatitis.  He  understood and agreed to proceed.  He acknowledged his DNR order and is agreeable to pausing this order for procedure today.  Talked over telephone this a.m. with patient's next of kin, his cousin Todd Estrada, he noted that patient last took Eliquis in a.m. hours of 07/04/2023, I also discussed EP in detail, benefits/risks/alternatives, he verbalized understanding and gave consent to proceed.  Past Medical History:  Diagnosis Date   Asthma    Atypical chest pain 08/13/2022   Community acquired pneumonia 09/14/2022   COPD (chronic obstructive pulmonary disease) (HCC)    Essential hypertension 08/19/2021   GERD (gastroesophageal reflux disease)    HAP (hospital-acquired pneumonia) 09/16/2022   History of tracheostomy    03/09/22-04/11/22   HLD (hyperlipidemia)    Hypertension    Hypokalemia 08/13/2022   Hypomagnesemia 08/13/2022   Lung cancer (HCC)    PAD (peripheral artery disease) (HCC)    Seizures (HCC) 06/02/2022   Sepsis (HCC) 08/13/2022   Sepsis due to pneumonia (HCC) 04/13/2023   Stroke (HCC) 02/2022   Past Surgical History:  Procedure Laterality Date   BRONCHIAL BIOPSY  05/30/2022   Procedure: BRONCHIAL BIOPSIES;  Surgeon: Leslye Peer, MD;  Location: [facility] ENDOSCOPY;  Service: Pulmonary;;   BRONCHIAL BRUSHINGS  05/30/2022   Procedure: BRONCHIAL BRUSHINGS;  Surgeon: Leslye Peer, MD;  Location: [facility] ENDOSCOPY;  Service: Pulmonary;;   BRONCHIAL NEEDLE ASPIRATION BIOPSY  05/30/2022   Procedure: BRONCHIAL NEEDLE ASPIRATION BIOPSIES;  Surgeon: Leslye Peer, MD;  Location: [facility] ENDOSCOPY;  Service: Pulmonary;;   BRONCHIAL WASHINGS  05/30/2022   Procedure: BRONCHIAL WASHINGS;  Surgeon: Leslye Peer, MD;  Location: [facility] ENDOSCOPY;  Service: Pulmonary;;   CHOLECYSTECTOMY N/A 07/05/2023   Procedure: LAPAROSCOPIC CHOLECYSTECTOMY WITH INTRAOPERATIVE CHOLANGIOGRAM;  Surgeon: Karie Soda, MD;  Location: [facility] ORS;  Service: General;  Laterality: N/A;   ESOPHAGOGASTRODUODENOSCOPY (EGD) WITH PROPOFOL  N/A 03/11/2022   Procedure: ESOPHAGOGASTRODUODENOSCOPY (EGD) WITH PROPOFOL;  Surgeon: Violeta Gelinas, MD;  Location: [facility] ENDOSCOPY;  Service: General;  Laterality: N/A;   FIDUCIAL MARKER PLACEMENT  05/30/2022   Procedure: FIDUCIAL MARKER PLACEMENT;  Surgeon: Leslye Peer, MD;  Location: [facility] ENDOSCOPY;  Service: Pulmonary;;   IR ANGIO INTRA EXTRACRAN SEL COM CAROTID INNOMINATE UNI R MOD SED  03/02/2022   IR CT HEAD LTD  03/02/2022   IR PERCUTANEOUS ART THROMBECTOMY/INFUSION INTRACRANIAL INC DIAG ANGIO  03/02/2022   PEG PLACEMENT N/A 03/11/2022   Procedure: PERCUTANEOUS ENDOSCOPIC GASTROSTOMY (PEG) PLACEMENT;  Surgeon: Violeta Gelinas, MD;  Location: [facility] ENDOSCOPY;  Service: General;  Laterality: N/A;   RADIOLOGY WITH ANESTHESIA N/A 03/02/2022   Procedure: IR WITH ANESTHESIA;  Surgeon: Julieanne Cotton, MD;  Location: [facility] OR;  Service: Radiology;  Laterality: N/A;   VIDEO BRONCHOSCOPY WITH RADIAL ENDOBRONCHIAL ULTRASOUND  05/30/2022   Procedure: VIDEO BRONCHOSCOPY WITH RADIAL ENDOBRONCHIAL ULTRASOUND;  Surgeon: Leslye Peer, MD;  Location: [facility] ENDOSCOPY;  Service: Pulmonary;;   Prior to Admission medications   Medication Sig Start Date End Date Taking? Authorizing Provider  apixaban (ELIQUIS) 5 MG TABS tablet TAKE 1 TABLET(5 MG) BY MOUTH TWICE DAILY Patient taking differently: Take 5 mg by mouth 2 (two) times daily. 04/11/23  Yes Merrilyn Puma, MD  atorvastatin (LIPITOR) 40 MG tablet Take 1 tablet (40 mg total) by mouth daily. 01/03/23 01/03/24 Yes Merrilyn Puma, MD  budesonide (PULMICORT) 0.5 MG/2ML nebulizer solution NEW PRESCRIPTION REQEUST: BUDESONIDE 0.5 MG/ - USE ONE VIAL TWICE DAILY Patient taking differently: Take 0.5 mg by nebulization in the morning and at bedtime. 11/29/22  Yes Martina Sinner, MD  levETIRAcetam (KEPPRA) 500 MG tablet Take 1 tablet (500 mg total) by mouth 2 (two) times daily. 12/07/22  Yes Ihor Austin, NP  Multiple Vitamin (MULTIVITAMIN WITH MINERALS) TABS tablet  Take 1 tablet by mouth daily. Patient taking differently: Take 1 tablet by mouth in the morning and at bedtime. 09/20/22  Yes Doran Stabler, DO  oxyCODONE-acetaminophen (PERCOCET/ROXICET) 5-325 MG tablet Take 1 tablet by mouth every 6 (six) hours as needed for severe pain. Patient taking differently: Take 1 tablet by mouth as needed for severe pain. 06/03/23  Yes Prosperi, Christian H, PA-C  Tiotropium Bromide-Olodaterol (STIOLTO RESPIMAT) 2.5-2.5 MCG/ACT AERS NEW PRESCRIPTION REQUEST: STIOLTO 2.5 MCG- INHALE TWO PUFFS BY MOUTH DAILY Patient taking differently: Inhale 2 each into the lungs daily. 11/29/22  Yes Martina Sinner, MD  albuterol (PROVENTIL) (2.5 MG/3ML) 0.083% nebulizer solution Take 3 mLs (2.5 mg total) by nebulization every 4 (four) hours as needed for wheezing or shortness of breath. Patient not taking: Reported on 04/13/2023 08/16/22   Steffanie Rainwater, MD  folic acid (FOLVITE) 1 MG tablet Take 1 tablet (1 mg total) by mouth daily. Patient not taking: Reported on 06/03/2023 03/21/23   Merrilyn Puma, MD  sucralfate (CARAFATE) 1 GM/10ML suspension Take 10 mLs (1 g total) by mouth 4 (four) times daily -  with meals and at bedtime. Patient not taking: Reported on 07/04/2023 07/02/23   Nira Conn, MD   Current Facility-Administered Medications  Medication Dose Route Frequency Provider Last Rate Last Admin   acetaminophen (TYLENOL) tablet 650 mg  650 mg Oral Q6H PRN Karie Soda, MD   650 mg at 07/04/23 2142   Or   acetaminophen (TYLENOL) suppository 650 mg  650 mg Rectal Q6H PRN Karie Soda, MD       arformoterol [facility]) nebulizer solution 15 mcg  15 mcg Nebulization BID Karie Soda, MD   15 mcg at 07/06/23 [phone]  And   umeclidinium bromide (INCRUSE ELLIPTA) 62.5 MCG/ACT 1 puff  1 puff Inhalation Daily Karie Soda, MD   1 puff at 07/06/23 0839   fentaNYL (SUBLIMAZE) injection 25 mcg  25 mcg Intravenous Q2H PRN Karie Soda, MD       levETIRAcetam (KEPPRA) tablet  500 mg  500 mg Oral BID Karie Soda, MD   500 mg at 07/05/23 2335   midodrine (PROAMATINE) tablet 2.5 mg  2.5 mg Oral BID WC Karie Soda, MD   2.5 mg at 07/05/23 0957   ondansetron (ZOFRAN) tablet 4 mg  4 mg Oral Q6H PRN Karie Soda, MD       Or   ondansetron [facility]) injection 4 mg  4 mg Intravenous Q6H PRN Karie Soda, MD       Oral care mouth rinse  15 mL Mouth Rinse PRN Karie Soda, MD       pantoprazole (PROTONIX) injection 40 mg  40 mg Intravenous Q24H Karie Soda, MD   40 mg at 07/05/23 0957   piperacillin-tazobactam (ZOSYN) IVPB 3.375 g  3.375 g Intravenous Trixie Deis, MD 12.5 mL/hr at 07/06/23 0611 3.375 g at 07/06/23 0160   sodium phosphate (FLEET) 7-19 GM/118ML enema 1 enema  1 enema Rectal Once PRN Karie Soda, MD       Allergies as of 07/04/2023   (No Known Allergies)   Family History  Problem Relation Age of Onset   Throat cancer Mother    Liver cancer Father    Kidney failure Sister    Cancer - Lung Paternal Uncle    Social History   Socioeconomic History   Marital status: Widowed    Spouse name: Not on file   Number of children: Not on file   Years of education: Not on file   Highest education level: Not on file  Occupational History   Not on file  Tobacco Use   Smoking status: Former    Current packs/day: 0.00    Types: Cigarettes    Quit date: 05/02/2022    Years since quitting: 1.1    Passive exposure: Never   Smokeless tobacco: Never  Vaping Use   Vaping status: Never Used  Substance and Sexual Activity   Alcohol use: Not Currently   Drug use: No   Sexual activity: Not on file  Other Topics Concern   Not on file  Social History Narrative   Not on file   Social Determinants of Health   Financial Resource Strain: Not on file  Food Insecurity: No Food Insecurity (07/05/2023)   Hunger Vital Sign    Worried About Running Out of Food in the Last Year: Never true    Ran Out of Food in the Last Year: Never true  Transportation  Needs: No Transportation Needs (07/05/2023)   PRAPARE - Administrator, Civil Service (Medical): No    Lack of Transportation (Non-Medical): No  Physical Activity: Not on file  Stress: Not on file  Social Connections: Socially Isolated (01/06/2023)   Social Connection and Isolation Panel [NHANES]    Frequency of Communication with Friends and Family: Once a week    Frequency of Social Gatherings with Friends and Family: Once a week    Attends Religious Services: Never    Database administrator or Organizations: No    Attends Banker Meetings: Never    Marital Status: Widowed  Intimate Partner Violence: Not At Risk (07/05/2023)   Humiliation, Afraid, Rape, and Kick questionnaire  Fear of Current or Ex-Partner: No    Emotionally Abused: No    Physically Abused: No    Sexually Abused: No   Review of Systems:  Review of Systems  Respiratory:  Negative for shortness of breath.   Cardiovascular:  Negative for chest pain.  Gastrointestinal:  Negative for abdominal pain, nausea and vomiting.    OBJECTIVE:   Temp:  [97.7 F (36.5 C)-99.2 F (37.3 C)] 98.4 F (36.9 C) (07/18 0727) Pulse Rate:  [68-80] 68 (07/18 0727) Resp:  [16-22] 16 (07/18 0727) BP: (115-168)/(70-87) 132/80 (07/18 0727) SpO2:  [94 %-100 %] 95 % (07/18 0843) Weight:  [74.8 kg] 74.8 kg (07/17 1429)   Physical Exam Constitutional:      General: He is not in acute distress.    Appearance: He is not ill-appearing, toxic-appearing or diaphoretic.  Cardiovascular:     Rate and Rhythm: Normal rate and regular rhythm.  Pulmonary:     Effort: No respiratory distress.     Breath sounds: Normal breath sounds.     Comments: Supplemental oxygen nasal cannula 2 L in place. Abdominal:     General: Bowel sounds are normal. There is no distension.     Palpations: Abdomen is soft.     Tenderness: There is abdominal tenderness. There is no guarding.     Comments: Clean/dry/intact dressing in right  abdomen appreciated.  JP drain with sanguinous fluid.  Skin:    General: Skin is warm and dry.  Neurological:     Mental Status: He is alert.     Labs: Recent Labs    07/04/23 0158 07/05/23 0511 07/06/23 0550  WBC 13.4* 9.2 8.7  HGB 14.1 11.6* 12.6*  HCT 44.3 37.1* 39.7  PLT 147* 140* 148*   BMET Recent Labs    07/04/23 0158 07/05/23 0511 07/06/23 0550  NA 138 134* 138  K 3.6 3.6 3.9  CL 102 103 100  CO2 28 24 29   GLUCOSE 116* 108* 153*  BUN 14 9 12   CREATININE 0.95 0.72 0.82  CALCIUM 9.3 8.0* 8.6*   LFT Recent Labs    07/06/23 0550  PROT 6.8  ALBUMIN 2.9*  AST 78*  ALT 53*  ALKPHOS 81  BILITOT 0.5   PT/INR Recent Labs    07/04/23 0850 07/06/23 0550  LABPROT 17.9* 16.5*  INR 1.5* 1.3*    Diagnostic imaging: DG Cholangiogram Operative  Result Date: 07/06/2023 CLINICAL DATA:  Intraoperative cholangiogram for elective cholecystectomy EXAM: INTRAOPERATIVE CHOLANGIOGRAM TECHNIQUE: Cholangiographic images from the C-arm fluoroscopic device were submitted for interpretation post-operatively. Please see the procedural report for the amount of contrast and the fluoroscopy time utilized. FLUOROSCOPY: Radiation Exposure Index (as provided by the fluoroscopic device): 6.91 mGy Kerma COMPARISON:  Abdominal ultrasound 07/04/2023 FINDINGS: 2 cine clips are submitted for review. The images demonstrate cannulation of the cystic duct remanent and opacification of the biliary tree. There is mild biliary ductal dilatation. A small filling defect is present in the distal common bile duct. A trace amount of contrast material trickles around the filling defect in through the ampulla. IMPRESSION: 1. Positive for incompletely obstructing choledocholithiasis. 2. Mild biliary ductal dilatation. Electronically Signed   By: Malachy Moan M.D.   On: 07/06/2023 08:18   ECHOCARDIOGRAM COMPLETE  Result Date: 07/04/2023    ECHOCARDIOGRAM REPORT   Patient Name:   WASIM HURLBUT Date of  Exam: 07/04/2023 Medical Rec #:  [id number]     Height:       72.0 in Accession #:  [id number]    Weight:       165.0 lb Date of Birth:  Oct 16, 1961     BSA:          1.963 m Patient Age:    62 years      BP:           119/73 mmHg Patient Gender: M             HR:           76 bpm. Exam Location:  Inpatient Procedure: 2D Echo, Cardiac Doppler, Color Doppler and Intracardiac            Opacification Agent Indications:    CHF I50.9  History:        Patient has prior history of Echocardiogram examinations, most                 recent 10/13/2022. CHF, Stroke, PAD and Emphysema; Risk                 Factors:Dyslipidemia and Former Smoker.  Sonographer:    Dondra Prader RVT RCS Referring Phys: [phone] Sun MANUEL ORTIZ  Sonographer Comments: Technically challenging study due to limited acoustic windows, Technically difficult study due to poor echo windows, suboptimal parasternal window, suboptimal apical window and suboptimal subcostal window. Technically difficult exam  due to very poor and limited windows; attempted Definity. IMPRESSIONS  1. Left ventricular ejection fraction, by estimation, is 60 to 65%. The left ventricle has normal function. Left ventricular endocardial border not optimally defined to evaluate regional wall motion. There is mild left ventricular hypertrophy. Left ventricular diastolic parameters are consistent with Grade I diastolic dysfunction (impaired relaxation).  2. Right ventricular systolic function is mildly reduced. The right ventricular size is normal.  3. The mitral valve is normal in structure. Trivial mitral valve regurgitation. No evidence of mitral stenosis.  4. The aortic valve was not well visualized. Aortic valve regurgitation is not visualized. No aortic stenosis is present.  5. Aortic dilatation noted. There is borderline dilatation of the ascending aorta, measuring 39 mm.  6. The inferior vena cava is normal in size with greater than 50% respiratory variability, suggesting right  atrial pressure of 3 mmHg.  7. Technically very limited study with poor images even with Definity contrast. LV function appears normal. Suspect RV function mildly decreased. FINDINGS  Left Ventricle: Left ventricular ejection fraction, by estimation, is 60 to 65%. The left ventricle has normal function. Left ventricular endocardial border not optimally defined to evaluate regional wall motion. Definity contrast agent was given IV to delineate the left ventricular endocardial borders. The left ventricular internal cavity size was normal in size. There is mild left ventricular hypertrophy. Left ventricular diastolic parameters are consistent with Grade I diastolic dysfunction (impaired relaxation). Right Ventricle: The right ventricular size is normal. No increase in right ventricular wall thickness. Right ventricular systolic function is mildly reduced. Left Atrium: Left atrial size was normal in size. Right Atrium: Right atrial size was normal in size. Pericardium: There is no evidence of pericardial effusion. Mitral Valve: The mitral valve is normal in structure. Trivial mitral valve regurgitation. No evidence of mitral valve stenosis. Tricuspid Valve: The tricuspid valve is not well visualized. Tricuspid valve regurgitation is trivial. No evidence of tricuspid stenosis. Aortic Valve: The aortic valve was not well visualized. Aortic valve regurgitation is not visualized. No aortic stenosis is present. Pulmonic Valve: The pulmonic valve was not well visualized. Pulmonic valve regurgitation is not visualized. No evidence of  pulmonic stenosis. Aorta: Aortic dilatation noted. There is borderline dilatation of the ascending aorta, measuring 39 mm. Venous: The inferior vena cava is normal in size with greater than 50% respiratory variability, suggesting right atrial pressure of 3 mmHg. IAS/Shunts: No atrial level shunt detected by color flow Doppler. Additional Comments: A venous catheter is visualized.  LEFT VENTRICLE  PLAX 2D LVIDd:         4.80 cm   Diastology LVIDs:         3.90 cm   LV e' medial:    8.16 cm/s LV PW:         1.10 cm   LV E/e' medial:  7.1 LV IVS:        1.10 cm   LV e' lateral:   10.00 cm/s LVOT diam:     2.20 cm   LV E/e' lateral: 5.8 LV SV:         63 LV SV Index:   32 LVOT Area:     3.80 cm  RIGHT VENTRICLE             IVC RV S prime:     12.90 cm/s  IVC diam: 1.20 cm TAPSE (M-mode): 2.8 cm LEFT ATRIUM         Index LA diam:    3.60 cm 1.83 cm/m  AORTIC VALVE             PULMONIC VALVE LVOT Vmax:   84.20 cm/s  PV Vmax:       0.90 m/s LVOT Vmean:  61.900 cm/s PV Peak grad:  3.3 mmHg LVOT VTI:    0.165 m  AORTA Ao Root diam: 3.50 cm Ao Asc diam:  3.90 cm MITRAL VALVE MV Area (PHT): 2.54 cm    SHUNTS MV Decel Time: 299 msec    Systemic VTI:  0.16 m MV E velocity: 57.80 cm/s  Systemic Diam: 2.20 cm MV A velocity: 75.80 cm/s MV E/A ratio:  0.76 Arvilla Meres MD Electronically signed by Arvilla Meres MD Signature Date/Time: 07/04/2023/4:21:28 PM    Final     IMPRESSION: Choledocholithiasis noted on intraoperative cholangiogram 07/05/2023 Acute cholecystitis, postop day #1 status post laparoscopic cholecystectomy Transaminase elevation in hepatocellular pattern Squamous cell lung cancer complicated by pulmonary embolism on Eliquis prior to admission  -Last Eliquis dose 07/04/23 AM per patient caregiver, his cousin CVA March 2023 with aphasia Peripheral artery disease COPD, on supplemental oxygen nasal cannula 2 liters  PLAN: -Recommend ERCP to address choledocholithiasis, discussed procedure with both patient and his next of kin Shiela Mayer), both verbalized understanding and elected to proceed -Maintain NPO  -Continue to hold Eliquis -Trend liver enzyme panel  -Dr. Ewing Schlein to complete ERCP this afternoon, further recommendations to follow pending procedure   LOS: 2 days   Liliane Shi, DO [facility] Gastroenterology

## 2023-07-06 NOTE — Progress Notes (Addendum)
Todd Estrada 2:28 PM  Subjective: Patient seen and examined in his hospital computer chart reviewed and case discussed with the surgical team and my partner Dr. Lorenso Quarry and we rediscussed his procedure and he has no new complaints  Objective: Vital signs stable afebrile no acute distress exam please see preassessment evaluation IntraOp cholangiogram and labs reviewed very minimal elevated liver tests BUN and creatinine okay CBC okay IOC reviewed CT reviewed ultrasound reviewed  Assessment: Positive CBD stones  Plan: Risk benefits methods and success rate of ERCP was rediscussed with the patient and we will proceed today with anesthesia assistance  [name]  office [phone] After 5PM or if no answer call [phone]

## 2023-07-06 NOTE — Progress Notes (Signed)
07/06/2023  Todd Estrada [id number] Jun 21, 1961  CARE TEAM: PCP: [name], MD  Outpatient Care Team: Patient Care Team: [name], MD as PCP - General Christell Constant, MD as PCP - Cardiology (Cardiology)  Inpatient Treatment Team: Treatment Team:  Lorin Glass, MD Ccs, Md, MD Massie Kluver, MD Vida Rigger, MD Desmond Dike, PT Hanley Hays, RN Norva Pavlov, RPH Moody Bruins, RN (Inactive) Lanier Clam, RN   Problem List:   Principal Problem:   Acute cholecystitis Active Problems:   Hx of ischemic left MCA stroke   Dyslipidemia   Emphysema lung (HCC)   Chronic heart failure with mildly reduced ejection fraction (HFmrEF) (HCC)   History of seizures   Aphasia as late effect of cerebrovascular accident   History of pulmonary embolism   Chronic hypotension   07/05/2023  POST-OPERATIVE DIAGNOSIS:    Acute on Chronic Calculus Cholecystitis with ischemia and empyema Distal common bile duct obstruction suspicious for choledocholithiasis   PROCEDURE:  Laparoscopic cholecystectomy with intraoperative cholangiogram (CPT code 40981)   SURGEON:  Ardeth Sportsman, MD, FACS.  OR FINDINGS:  Very dilated gallbladder with chronic adhesions in thickening consistent with chronic cholecystitis.  Edema with white bile and some empyema concerning for acute cholecystitis.   Short cystic duct with some stones within it.  Cholangiogram shows very dilated biliary system with reverse meniscus sign and persistent distal common bile duct obstruction suspicious for choledocholithiasis.   Liver: Mild friability steatohepatitis cyst but no concerning for cirrhosis.  Left upper quadrant divot suspicious for old site of gastrostomy tube and thin strand going to stomach and stomach not adherent.  Assessment [facility] Stay = 2 days) 1 Day Post-Op    Stable    Plan:  Acute cholecystitis  -Laparoscopic cholecystectomy with IOC SG requiring drainage  empyema.  07/06/2023 -IV antibiotics 5 days postop.  Would favor piperacillin/tazobactam -Intraoperative cholangiogram concerning for obstructing choledocholithiasis.  Radiologist concerned as well.  Slight increase in LFTs.  Had reached out to [facility] gastroenterology for consultation to see if ERCP appropriate. -I would leave surgical Blake drain in place until 10 days postop since he is rather of poor health condition and high risk for delayed leak or abscess. -No need to keep Foley catheter from our standpoint. -  FEN: Okay to advance diet gradually from our standpoint but will defer to GI given need for possible ERCP.   Okay to have patient be on dry side given his cardiopulmonary issues but will defer to primary [facility] service  Foley: No need to keep it from our standpoint.  Defer to primary service. VTE: hold eliquis, ok for heparin gtt if needed  ID: rocephin/flagyl   - per [facility] -  Hx of PE on Eliquis - last dose 7/15 AM Hx of CVA with aphasia and R hemiparesis  Hx of HTN Hypotension on midodrine TID HLD GERD Seizure disease Lung cancer Peripheral vascular disease -Disposition: per [facility]       I reviewed nursing notes, hospitalist notes, last 24 h vitals and pain scores, last 48 h intake and output, last 24 h labs and trends, and last 24 h imaging results.  I have reviewed this patient's available data, including medical history, events of note, test results, etc as part of my evaluation.   A significant portion of that time was spent in counseling. Care during the described time interval was provided by me.  This care required moderate level of medical decision making.  07/06/2023  Subjective: (Chief complaint)  No major events.  Patient denies pain  Objective:  Vital signs:  Vitals:   07/05/23 2314 07/06/23 0301 07/06/23 0727 07/06/23 0843  BP: (!) 154/83 (!) 144/83 132/80   Pulse: 78 70 68   Resp: 20 20 16    Temp: 98.6 F (37 C) 99.2 F (37.3 C) 98.4 F  (36.9 C)   TempSrc: Oral Oral Oral   SpO2: 99% 98% 100% 95%  Weight:      Height:           Intake/Output   Yesterday:  07/17 0701 - 07/18 0700 In: 770 [P.O.:170; I.V.:600] Out: 2225 [Urine:1800; Drains:375; Blood:50] This shift:  No intake/output data recorded.  Bowel function:  Flatus: No  BM:  No  Drain: Serosanguinous   Physical Exam:  General: Pt awake/alert in no acute distress Eyes: PERRL, normal EOM.  Sclera clear.  No icterus Neuro: Again with expressive aphasia but seems to have good comprehension.  Lymph: No head/neck/groin lymphadenopathy Psych:  No delerium/psychosis/paranoia.  Oriented x 4 I think HENT: Normocephalic, Mucus membranes moist.  No thrush Neck: Supple, No tracheal deviation.  No obvious thyromegaly Chest: No pain to chest wall compression.  Good respiratory excursion.  No audible wheezing CV:  Pulses intact.  Regular rhythm.  No major extremity edema MS: Normal AROM mjr joints.  No obvious deformity  Abdomen: Soft.  Nondistended.  Mildly tender at incisions only.  No evidence of peritonitis.  No incarcerated hernias.  Ext:   No deformity.  No mjr edema.  No cyanosis Skin: No petechiae / purpurea.  No major sores.  Warm and dry    Results:   Cultures: Recent Results (from the past 720 hour(s))  Blood culture (routine x 2)     Status: None (Preliminary result)   Collection Time: 07/04/23  1:58 AM   Specimen: BLOOD  Result Value Ref Range Status   Specimen Description   Final    BLOOD BLOOD LEFT ARM Performed at [facility], [address], [place], [place] [address]    Special Requests   Final    BOTTLES DRAWN AEROBIC AND ANAEROBIC Blood Culture adequate volume Performed at [facility], [address] [place], [place] [address]    Culture   Final    NO GROWTH 1 DAY Performed at [facility], [address] [place], [place] [address]    Report Status PENDING  Incomplete  Blood  culture (routine x 2)     Status: None (Preliminary result)   Collection Time: 07/04/23  1:58 AM   Specimen: BLOOD  Result Value Ref Range Status   Specimen Description   Final    BLOOD BLOOD RIGHT ARM Performed at [facility], [address], [place], [place] [address]    Special Requests   Final    BOTTLES DRAWN AEROBIC AND ANAEROBIC Blood Culture results may not be optimal due to an excessive volume of blood received in culture bottles Performed at [facility], [address], [place], [place] [address]    Culture   Final    NO GROWTH 1 DAY Performed at [facility], [address], [place], [place] [address]    Report Status PENDING  Incomplete  Resp panel by RT-PCR (RSV, Flu A&B, Covid) Peripheral     Status: None   Collection Time: 07/04/23  1:58 AM   Specimen: Peripheral; Nasal Swab  Result Value Ref Range Status  SARS Coronavirus 2 by RT PCR NEGATIVE NEGATIVE Final    Comment: (NOTE) SARS-CoV-2 target nucleic acids are NOT DETECTED.  The SARS-CoV-2 RNA is generally detectable in upper respiratory specimens during the acute phase of infection. The lowest concentration of SARS-CoV-2 viral copies this assay can detect is 138 copies/mL. A negative result does not preclude SARS-Cov-2 infection and should not be used as the sole basis for treatment or other patient management decisions. A negative result may occur with  improper specimen collection/handling, submission of specimen other than nasopharyngeal swab, presence of viral mutation(s) within the areas targeted by this assay, and inadequate number of viral copies(<138 copies/mL). A negative result must be combined with clinical observations, patient history, and epidemiological information. The expected result is Negative.  Fact Sheet for Patients:  BloggerCourse.com  Fact Sheet for Healthcare Providers:   SeriousBroker.it  This test is no t yet approved or cleared by the [place] FDA and  has been authorized for detection and/or diagnosis of SARS-CoV-2 by FDA under an Emergency Use Authorization (EUA). This EUA will remain  in effect (meaning this test can be used) for the duration of the COVID-19 declaration under Section 564(b)(1) of the Act, 21 U.S.C.section 360bbb-3(b)(1), unless the authorization is terminated  or revoked sooner.       Influenza A by PCR NEGATIVE NEGATIVE Final   Influenza B by PCR NEGATIVE NEGATIVE Final    Comment: (NOTE) The Xpert Xpress SARS-CoV-2/FLU/RSV plus assay is intended as an aid in the diagnosis of influenza from Nasopharyngeal swab specimens and should not be used as a sole basis for treatment. Nasal washings and aspirates are unacceptable for Xpert Xpress SARS-CoV-2/FLU/RSV testing.  Fact Sheet for Patients: BloggerCourse.com  Fact Sheet for Healthcare Providers: SeriousBroker.it  This test is not yet approved or cleared by the [place] FDA and has been authorized for detection and/or diagnosis of SARS-CoV-2 by FDA under an Emergency Use Authorization (EUA). This EUA will remain in effect (meaning this test can be used) for the duration of the COVID-19 declaration under Section 564(b)(1) of the Act, 21 U.S.C. section 360bbb-3(b)(1), unless the authorization is terminated or revoked.     Resp Syncytial Virus by PCR NEGATIVE NEGATIVE Final    Comment: (NOTE) Fact Sheet for Patients: BloggerCourse.com  Fact Sheet for Healthcare Providers: SeriousBroker.it  This test is not yet approved or cleared by the [place] FDA and has been authorized for detection and/or diagnosis of SARS-CoV-2 by FDA under an Emergency Use Authorization (EUA). This EUA will remain in effect (meaning this test can be used) for  the duration of the COVID-19 declaration under Section 564(b)(1) of the Act, 21 U.S.C. section 360bbb-3(b)(1), unless the authorization is terminated or revoked.  Performed at [facility], [address], [place], [place] [address]   Surgical pcr screen     Status: None   Collection Time: 07/04/23  9:37 PM   Specimen: Nasal Mucosa; Nasal Swab  Result Value Ref Range Status   MRSA, PCR NEGATIVE NEGATIVE Final   Staphylococcus aureus NEGATIVE NEGATIVE Final    Comment: (NOTE) The Xpert SA Assay (FDA approved for NASAL specimens in patients 20 years of age and older), is one component of a comprehensive surveillance program. It is not intended to diagnose infection nor to guide or monitor treatment. Performed at [facility], [address], [place], [place] [address]     Labs: Results for orders placed or performed during the hospital encounter of 07/04/23 (from  the past 48 hour(s))  Surgical pcr screen     Status: None   Collection Time: 07/04/23  9:37 PM   Specimen: Nasal Mucosa; Nasal Swab  Result Value Ref Range   MRSA, PCR NEGATIVE NEGATIVE   Staphylococcus aureus NEGATIVE NEGATIVE    Comment: (NOTE) The Xpert SA Assay (FDA approved for NASAL specimens in patients 33 years of age and older), is one component of a comprehensive surveillance program. It is not intended to diagnose infection nor to guide or monitor treatment. Performed at [facility], [address] [place], [place] [address]   CBC     Status: Abnormal   Collection Time: 07/05/23  5:11 AM  Result Value Ref Range   WBC 9.2 4.0 - 10.5 K/uL   RBC 3.99 (L) 4.22 - 5.81 MIL/uL   Hemoglobin 11.6 (L) 13.0 - 17.0 g/dL   HCT 60.1 (L) 09.3 - 23.5 %   MCV 93.0 80.0 - 100.0 fL   MCH 29.1 26.0 - 34.0 pg   MCHC 31.3 30.0 - 36.0 g/dL   RDW 57.3 22.0 - 25.4 %   Platelets 140 (L) 150 - 400 K/uL   nRBC 0.0 0.0 - 0.2 %    Comment: Performed at [facility], [address], [place], [place] [address]  Comprehensive metabolic panel     Status: Abnormal   Collection Time: 07/05/23  5:11 AM  Result Value Ref Range   Sodium 134 (L) 135 - 145 mmol/L   Potassium 3.6 3.5 - 5.1 mmol/L   Chloride 103 98 - 111 mmol/L   CO2 24 22 - 32 mmol/L   Glucose, Bld 108 (H) 70 - 99 mg/dL    Comment: Glucose reference range applies only to samples taken after fasting for at least 8 hours.   BUN 9 8 - 23 mg/dL   Creatinine, Ser 3.76 0.61 - 1.24 mg/dL   Calcium 8.0 (L) 8.9 - 10.3 mg/dL   Total Protein 6.2 (L) 6.5 - 8.1 g/dL   Albumin 2.9 (L) 3.5 - 5.0 g/dL   AST 20 15 - 41 U/L   ALT 19 0 - 44 U/L   Alkaline Phosphatase 65 38 - 126 U/L   Total Bilirubin 0.9 0.3 - 1.2 mg/dL   GFR, Estimated >28 >31 mL/min    Comment: (NOTE) Calculated using the CKD-EPI Creatinine Equation (2021)    Anion gap 7 5 - 15    Comment: Performed at [facility], [address] [place], [place] [address]  Lipid panel     Status: Abnormal   Collection Time: 07/05/23  5:11 AM  Result Value Ref Range   Cholesterol 80 0 - 200 mg/dL   Triglycerides 49 <607 mg/dL   HDL 37 (L) >37 mg/dL   Total CHOL/HDL Ratio 2.2 RATIO   VLDL 10 0 - 40 mg/dL   LDL Cholesterol 33 0 - 99 mg/dL    Comment:        Total Cholesterol/HDL:CHD Risk Coronary Heart Disease Risk Table                     Men   Women  1/2 Average Risk   3.4   3.3  Average Risk       5.0   4.4  2 X Average Risk   9.6   7.1  3 X Average Risk  23.4   11.0        Use the calculated Patient  Ratio above and the CHD Risk Table to determine the patient's CHD Risk.        ATP III CLASSIFICATION (LDL):  <100     mg/dL   Optimal  782-956  mg/dL   Near or Above                    Optimal  130-159  mg/dL   Borderline  213-086  mg/dL   High  >578     mg/dL   Very High Performed at [facility], [address], [place], [place] [address]   CBC     Status: Abnormal    Collection Time: 07/06/23  5:50 AM  Result Value Ref Range   WBC 8.7 4.0 - 10.5 K/uL   RBC 4.31 4.22 - 5.81 MIL/uL   Hemoglobin 12.6 (L) 13.0 - 17.0 g/dL   HCT 95.2 84.1 - 32.4 %   MCV 92.1 80.0 - 100.0 fL   MCH 29.2 26.0 - 34.0 pg   MCHC 31.7 30.0 - 36.0 g/dL   RDW 40.1 02.7 - 25.3 %   Platelets 148 (L) 150 - 400 K/uL   nRBC 0.0 0.0 - 0.2 %    Comment: Performed at [facility], [address] [place], [place] [address]  Comprehensive metabolic panel     Status: Abnormal   Collection Time: 07/06/23  5:50 AM  Result Value Ref Range   Sodium 138 135 - 145 mmol/L   Potassium 3.9 3.5 - 5.1 mmol/L   Chloride 100 98 - 111 mmol/L   CO2 29 22 - 32 mmol/L   Glucose, Bld 153 (H) 70 - 99 mg/dL    Comment: Glucose reference range applies only to samples taken after fasting for at least 8 hours.   BUN 12 8 - 23 mg/dL   Creatinine, Ser 3.47 0.61 - 1.24 mg/dL   Calcium 8.6 (L) 8.9 - 10.3 mg/dL   Total Protein 6.8 6.5 - 8.1 g/dL   Albumin 2.9 (L) 3.5 - 5.0 g/dL   AST 78 (H) 15 - 41 U/L   ALT 53 (H) 0 - 44 U/L   Alkaline Phosphatase 81 38 - 126 U/L   Total Bilirubin 0.5 0.3 - 1.2 mg/dL   GFR, Estimated >42 >59 mL/min    Comment: (NOTE) Calculated using the CKD-EPI Creatinine Equation (2021)    Anion gap 9 5 - 15    Comment: Performed at [facility], [address] [place], [place] [address]  Protime-INR     Status: Abnormal   Collection Time: 07/06/23  5:50 AM  Result Value Ref Range   Prothrombin Time 16.5 (H) 11.4 - 15.2 seconds   INR 1.3 (H) 0.8 - 1.2    Comment: (NOTE) INR goal varies based on device and disease states. Performed at [facility], [address], [place], [place] [address]     Imaging / Studies: DG Cholangiogram Operative  Result Date: 07/06/2023 CLINICAL DATA:  Intraoperative cholangiogram for elective cholecystectomy EXAM: INTRAOPERATIVE CHOLANGIOGRAM TECHNIQUE: Cholangiographic images from the C-arm  fluoroscopic device were submitted for interpretation post-operatively. Please see the procedural report for the amount of contrast and the fluoroscopy time utilized. FLUOROSCOPY: Radiation Exposure Index (as provided by the fluoroscopic device): 6.91 mGy Kerma COMPARISON:  Abdominal ultrasound 07/04/2023 FINDINGS: 2 cine clips are submitted for review. The images demonstrate cannulation of the cystic duct remanent and opacification of the biliary tree. There is mild biliary ductal dilatation. A  small filling defect is present in the distal common bile duct. A trace amount of contrast material trickles around the filling defect in through the ampulla. IMPRESSION: 1. Positive for incompletely obstructing choledocholithiasis. 2. Mild biliary ductal dilatation. Electronically Signed   By: Malachy Moan M.D.   On: 07/06/2023 08:18   ECHOCARDIOGRAM COMPLETE  Result Date: 07/04/2023    ECHOCARDIOGRAM REPORT   Patient Name:   Todd Estrada Date of Exam: 07/04/2023 Medical Rec #:  [id number]     Height:       72.0 in Accession #:    [id number]    Weight:       165.0 lb Date of Birth:  07-14-1961     BSA:          1.963 m Patient Age:    62 years      BP:           119/73 mmHg Patient Gender: M             HR:           76 bpm. Exam Location:  Inpatient Procedure: 2D Echo, Cardiac Doppler, Color Doppler and Intracardiac            Opacification Agent Indications:    CHF I50.9  History:        Patient has prior history of Echocardiogram examinations, most                 recent 10/13/2022. CHF, Stroke, PAD and Emphysema; Risk                 Factors:Dyslipidemia and Former Smoker.  Sonographer:    Dondra Prader RVT RCS Referring Phys: [phone] Arrin MANUEL ORTIZ  Sonographer Comments: Technically challenging study due to limited acoustic windows, Technically difficult study due to poor echo windows, suboptimal parasternal window, suboptimal apical window and suboptimal subcostal window. Technically difficult exam  due to  very poor and limited windows; attempted Definity. IMPRESSIONS  1. Left ventricular ejection fraction, by estimation, is 60 to 65%. The left ventricle has normal function. Left ventricular endocardial border not optimally defined to evaluate regional wall motion. There is mild left ventricular hypertrophy. Left ventricular diastolic parameters are consistent with Grade I diastolic dysfunction (impaired relaxation).  2. Right ventricular systolic function is mildly reduced. The right ventricular size is normal.  3. The mitral valve is normal in structure. Trivial mitral valve regurgitation. No evidence of mitral stenosis.  4. The aortic valve was not well visualized. Aortic valve regurgitation is not visualized. No aortic stenosis is present.  5. Aortic dilatation noted. There is borderline dilatation of the ascending aorta, measuring 39 mm.  6. The inferior vena cava is normal in size with greater than 50% respiratory variability, suggesting right atrial pressure of 3 mmHg.  7. Technically very limited study with poor images even with Definity contrast. LV function appears normal. Suspect RV function mildly decreased. FINDINGS  Left Ventricle: Left ventricular ejection fraction, by estimation, is 60 to 65%. The left ventricle has normal function. Left ventricular endocardial border not optimally defined to evaluate regional wall motion. Definity contrast agent was given IV to delineate the left ventricular endocardial borders. The left ventricular internal cavity size was normal in size. There is mild left ventricular hypertrophy. Left ventricular diastolic parameters are consistent with Grade I diastolic dysfunction (impaired relaxation). Right Ventricle: The right ventricular size is normal. No increase in right ventricular wall thickness. Right ventricular systolic function is mildly reduced. Left  Atrium: Left atrial size was normal in size. Right Atrium: Right atrial size was normal in size. Pericardium: There  is no evidence of pericardial effusion. Mitral Valve: The mitral valve is normal in structure. Trivial mitral valve regurgitation. No evidence of mitral valve stenosis. Tricuspid Valve: The tricuspid valve is not well visualized. Tricuspid valve regurgitation is trivial. No evidence of tricuspid stenosis. Aortic Valve: The aortic valve was not well visualized. Aortic valve regurgitation is not visualized. No aortic stenosis is present. Pulmonic Valve: The pulmonic valve was not well visualized. Pulmonic valve regurgitation is not visualized. No evidence of pulmonic stenosis. Aorta: Aortic dilatation noted. There is borderline dilatation of the ascending aorta, measuring 39 mm. Venous: The inferior vena cava is normal in size with greater than 50% respiratory variability, suggesting right atrial pressure of 3 mmHg. IAS/Shunts: No atrial level shunt detected by color flow Doppler. Additional Comments: A venous catheter is visualized.  LEFT VENTRICLE PLAX 2D LVIDd:         4.80 cm   Diastology LVIDs:         3.90 cm   LV e' medial:    8.16 cm/s LV PW:         1.10 cm   LV E/e' medial:  7.1 LV IVS:        1.10 cm   LV e' lateral:   10.00 cm/s LVOT diam:     2.20 cm   LV E/e' lateral: 5.8 LV SV:         63 LV SV Index:   32 LVOT Area:     3.80 cm  RIGHT VENTRICLE             IVC RV S prime:     12.90 cm/s  IVC diam: 1.20 cm TAPSE (M-mode): 2.8 cm LEFT ATRIUM         Index LA diam:    3.60 cm 1.83 cm/m  AORTIC VALVE             PULMONIC VALVE LVOT Vmax:   84.20 cm/s  PV Vmax:       0.90 m/s LVOT Vmean:  61.900 cm/s PV Peak grad:  3.3 mmHg LVOT VTI:    0.165 m  AORTA Ao Root diam: 3.50 cm Ao Asc diam:  3.90 cm MITRAL VALVE MV Area (PHT): 2.54 cm    SHUNTS MV Decel Time: 299 msec    Systemic VTI:  0.16 m MV E velocity: 57.80 cm/s  Systemic Diam: 2.20 cm MV A velocity: 75.80 cm/s MV E/A ratio:  0.76 Arvilla Meres MD Electronically signed by Arvilla Meres MD Signature Date/Time: 07/04/2023/4:21:28 PM    Final      Medications / Allergies: per chart  Antibiotics: Anti-infectives (From admission, onward)    Start     Dose/Rate Route Frequency Ordered Stop   07/05/23 1830  piperacillin-tazobactam (ZOSYN) IVPB 3.375 g        3.375 g 12.5 mL/hr over 240 Minutes Intravenous Every 8 hours 07/05/23 1823 07/10/23 2159   07/05/23 0600  cefTRIAXone (ROCEPHIN) 2 g in sodium chloride 0.9 % 100 mL IVPB  Status:  Discontinued        2 g 200 mL/hr over 30 Minutes Intravenous Every 24 hours 07/04/23 0719 07/05/23 1823   07/04/23 0800  metroNIDAZOLE (FLAGYL) IVPB 500 mg  Status:  Discontinued        500 mg 100 mL/hr over 60 Minutes Intravenous Every 12 hours 07/04/23 0719 07/05/23 1823   07/04/23 0600  cefTRIAXone (ROCEPHIN) 2 g in sodium chloride 0.9 % 100 mL IVPB        2 g 200 mL/hr over 30 Minutes Intravenous  Once 07/04/23 0551 07/04/23 0710         Note: Portions of this report may have been transcribed using voice recognition software. Every effort was made to ensure accuracy; however, inadvertent computerized transcription errors may be present.   Any transcriptional errors that result from this process are unintentional.    Ardeth Sportsman, MD, FACS, MASCRS Esophageal, Gastrointestinal & Colorectal Surgery Robotic and Minimally Invasive Surgery  [facility] [address] [place], [place] [address] [phone] Fax [phone] Main  CONTACT INFORMATION:  Weekday (9AM-5PM): Call [facility] main office at [phone]  Weeknight (5PM-9AM) or Weekend/Holiday: Check [email or web address] (password " [email or web address]") for General Surgery [facility] coverage  (Please, do not use SecureChat as it is not reliable communication to reach operating surgeons for immediate patient care given surgeries/outpatient duties/clinic/cross-coverage/off post-call which would lead to a delay in care.  Epic staff messaging available for outptient concerns, but may not be  answered for 48 hours or more).     07/06/2023  10:30 AM

## 2023-07-06 NOTE — Progress Notes (Signed)
PT Cancellation Note  Patient Details Name: Todd Estrada MRN: [id number] DOB: Aug 17, 1961   Cancelled Treatment:    Reason Eval/Treat Not Completed: Patient at procedure or test/unavailable Blanchard Kelch PT Acute Rehabilitation Services Office [phone] Weekend [phone]   Rada Hay 07/06/2023, 1:52 PM

## 2023-07-06 NOTE — Plan of Care (Signed)
  Problem: Clinical Measurements: Goal: Diagnostic test results will improve Outcome: Progressing Goal: Respiratory complications will improve Outcome: Progressing Goal: Cardiovascular complication will be avoided Outcome: Progressing   Problem: Pain Managment: Goal: General experience of comfort will improve Outcome: Progressing   Problem: Safety: Goal: Ability to remain free from injury will improve Outcome: Progressing   

## 2023-07-06 NOTE — Anesthesia Preprocedure Evaluation (Addendum)
Anesthesia Evaluation  Patient identified by MRN, date of birth, ID band Patient awake    Reviewed: Allergy & Precautions, NPO status , Patient's Chart, lab work & pertinent test results  Airway Mallampati: II  TM Distance: >3 FB     Dental  (+) Poor Dentition   Pulmonary asthma , COPD, former smoker   Pulmonary exam normal        Cardiovascular hypertension, + Peripheral Vascular Disease   Rhythm:Regular Rate:Normal  ECHO:  1. Left ventricular ejection fraction, by estimation, is 60 to 65%. The left ventricle has normal function. Left ventricular endocardial border not optimally defined to evaluate regional wall motion. There is mild left ventricular hypertrophy. Left ventricular diastolic parameters are consistent with Grade I diastolic dysfunction (impaired relaxation).  2. Right ventricular systolic function is mildly reduced. The right ventricular size is normal.  3. The mitral valve is normal in structure. Trivial mitral valve regurgitation. No evidence of mitral stenosis.  4. The aortic valve was not well visualized. Aortic valve regurgitation is not visualized. No aortic stenosis is present.  5. Aortic dilatation noted. There is borderline dilatation of the ascending aorta, measuring 39 mm.  6. The inferior vena cava is normal in size with greater than 50% respiratory variability, suggesting right atrial pressure of 3 mmHg.  7. Technically very limited study with poor images even with Definity contrast. LV function appears normal. Suspect RV function mildly decreased.    Neuro/Psych Seizures -,  CVA (aphasic), Residual Symptoms  negative psych ROS   GI/Hepatic Neg liver ROS,GERD  ,,Choledocholithiasis    Endo/Other  negative endocrine ROS    Renal/GU negative Renal ROS  negative genitourinary   Musculoskeletal negative musculoskeletal ROS (+)    Abdominal Normal abdominal exam  (+)   Peds  Hematology  (+) Blood  dyscrasia, anemia   Anesthesia Other Findings   Reproductive/Obstetrics                             Anesthesia Physical Anesthesia Plan  ASA: 3  Anesthesia Plan: General   Post-op Pain Management:    Induction: Intravenous  PONV Risk Score and Plan: 2 and Ondansetron and Treatment may vary due to age or medical condition  Airway Management Planned: Mask and Oral ETT  Additional Equipment: None  Intra-op Plan:   Post-operative Plan: Extubation in OR  Informed Consent: I have reviewed the patients History and Physical, chart, labs and discussed the procedure including the risks, benefits and alternatives for the proposed anesthesia with the patient or authorized representative who has indicated his/her understanding and acceptance.   Patient has DNR.   Dental advisory given and Consent reviewed with POA  Plan Discussed with: CRNA  Anesthesia Plan Comments:        Anesthesia Quick Evaluation

## 2023-07-07 ENCOUNTER — Encounter (HOSPITAL_COMMUNITY): Payer: Self-pay | Admitting: Gastroenterology

## 2023-07-07 DIAGNOSIS — K810 Acute cholecystitis: Secondary | ICD-10-CM | POA: Diagnosis not present

## 2023-07-07 DIAGNOSIS — R4701 Aphasia: Secondary | ICD-10-CM | POA: Diagnosis present

## 2023-07-07 LAB — COMPREHENSIVE METABOLIC PANEL
ALT: 41 U/L (ref 0–44)
AST: 36 U/L (ref 15–41)
Albumin: 2.7 g/dL — ABNORMAL LOW (ref 3.5–5.0)
Alkaline Phosphatase: 61 U/L (ref 38–126)
Anion gap: 7 (ref 5–15)
BUN: 20 mg/dL (ref 8–23)
CO2: 27 mmol/L (ref 22–32)
Calcium: 8.1 mg/dL — ABNORMAL LOW (ref 8.9–10.3)
Chloride: 102 mmol/L (ref 98–111)
Creatinine, Ser: 0.87 mg/dL (ref 0.61–1.24)
GFR, Estimated: >60 mL/min (ref >60)
Glucose, Bld: 131 mg/dL — ABNORMAL HIGH (ref 70–99)
Potassium: 4.1 mmol/L (ref 3.5–5.1)
Sodium: 136 mmol/L (ref 135–145)
Total Bilirubin: 0.4 mg/dL (ref 0.3–1.2)
Total Protein: 6.3 g/dL — ABNORMAL LOW (ref 6.5–8.1)

## 2023-07-07 LAB — CBC
HCT: 34.1 % — ABNORMAL LOW (ref 39.0–52.0)
Hemoglobin: 10.7 g/dL — ABNORMAL LOW (ref 13.0–17.0)
MCH: 29.3 pg (ref 26.0–34.0)
MCHC: 31.4 g/dL (ref 30.0–36.0)
MCV: 93.4 fL (ref 80.0–100.0)
Platelets: 151 10*3/uL (ref 150–400)
RBC: 3.65 MIL/uL — ABNORMAL LOW (ref 4.22–5.81)
RDW: 13.3 % (ref 11.5–15.5)
WBC: 8.2 10*3/uL (ref 4.0–10.5)
nRBC: 0 % (ref 0.0–0.2)

## 2023-07-07 LAB — CULTURE, BLOOD (ROUTINE X 2): Culture: NO GROWTH

## 2023-07-07 LAB — SURGICAL PATHOLOGY

## 2023-07-07 MED ORDER — APIXABAN 5 MG PO TABS
5.0000 mg | ORAL_TABLET | Freq: Two times a day (BID) | ORAL | Status: DC
  Administered 2023-07-08 – 2023-07-09 (×3): 5 mg via ORAL
  Filled 2023-07-07 (×3): qty 1

## 2023-07-07 MED ORDER — SODIUM CHLORIDE 0.9 % IV BOLUS
1000.0000 mL | Freq: Once | INTRAVENOUS | Status: AC
  Administered 2023-07-07: 1000 mL via INTRAVENOUS

## 2023-07-07 NOTE — Progress Notes (Signed)
Progress Note  1 Day Post-Op  Subjective: Pt denies abdominal pain. Denies nausea or vomiting. Drinking broth this AM. No bowel function yet.   Objective: Vital signs in last 24 hours: Temp:  [97.8 F (36.6 C)-98.7 F (37.1 C)] 98.7 F (37.1 C) (07/19 0455) Pulse Rate:  [65-80] 69 (07/19 0455) Resp:  [12-22] 18 (07/18 2016) BP: (88-145)/(43-84) 112/65 (07/19 0455) SpO2:  [94 %-100 %] 97 % (07/19 0851) Last BM Date :  (Unknown)  Intake/Output from previous day: 07/18 0701 - 07/19 0700 In: 1040 [P.O.:540; I.V.:300; IV Piggyback:200] Out: 1224.5 [Urine:1150; Drains:74.5] Intake/Output this shift: Total I/O In: 360 [P.O.:360] Out: -   PE: General: pleasant, WD, chronically ill appearing male who is laying in bed in NAD HEENT:  sclera anicteric  Heart: regular, rate, and rhythm.   Lungs:  Respiratory effort nonlabored Abd: soft, NT, ND, incisions C/D/I, drain with SS fluid     Lab Results:  Recent Labs    07/06/23 0550 07/07/23 0537  WBC 8.7 8.2  HGB 12.6* 10.7*  HCT 39.7 34.1*  PLT 148* 151   BMET Recent Labs    07/06/23 0550 07/07/23 0537  NA 138 136  K 3.9 4.1  CL 100 102  CO2 29 27  GLUCOSE 153* 131*  BUN 12 20  CREATININE 0.82 0.87  CALCIUM 8.6* 8.1*   PT/INR Recent Labs    07/06/23 0550  LABPROT 16.5*  INR 1.3*   CMP     Component Value Date/Time   NA 136 07/07/2023 0537   NA 140 09/09/2022 0908   K 4.1 07/07/2023 0537   CL 102 07/07/2023 0537   CO2 27 07/07/2023 0537   GLUCOSE 131 (H) 07/07/2023 0537   BUN 20 07/07/2023 0537   BUN 16 09/09/2022 0908   CREATININE 0.87 07/07/2023 0537   CREATININE 0.80 05/04/2023 1332   CALCIUM 8.1 (L) 07/07/2023 0537   PROT 6.3 (L) 07/07/2023 0537   ALBUMIN 2.7 (L) 07/07/2023 0537   AST 36 07/07/2023 0537   AST 13 (L) 05/04/2023 1332   ALT 41 07/07/2023 0537   ALT 9 05/04/2023 1332   ALKPHOS 61 07/07/2023 0537   BILITOT 0.4 07/07/2023 0537   BILITOT 0.5 05/04/2023 1332   GFRNONAA >60  07/07/2023 0537   GFRNONAA >60 05/04/2023 1332   Lipase     Component Value Date/Time   LIPASE 23 07/04/2023 0158       Studies/Results: DG ERCP  Result Date: 07/06/2023 CLINICAL DATA:  [id number] Surgery, elective [id number] EXAM: ERCP TECHNIQUE: Multiple spot images obtained with the fluoroscopic device and submitted for interpretation post-procedure. COMPARISON:  Intraop cholangiogram from previous day FINDINGS: A series of fluoroscopic spot images document endoscopic cannulation and opacification of the CBD, with passage of balloon tipped catheter. No extravasation. Incomplete opacification of the intrahepatic biliary tree which appears mildly distended centrally. IMPRESSION: Endoscopic CBD cannulation and intervention as above. Electronically Signed   By: Corlis Leak M.D.   On: 07/06/2023 16:23   DG Cholangiogram Operative  Result Date: 07/06/2023 CLINICAL DATA:  Intraoperative cholangiogram for elective cholecystectomy EXAM: INTRAOPERATIVE CHOLANGIOGRAM TECHNIQUE: Cholangiographic images from the C-arm fluoroscopic device were submitted for interpretation post-operatively. Please see the procedural report for the amount of contrast and the fluoroscopy time utilized. FLUOROSCOPY: Radiation Exposure Index (as provided by the fluoroscopic device): 6.91 mGy Kerma COMPARISON:  Abdominal ultrasound 07/04/2023 FINDINGS: 2 cine clips are submitted for review. The images demonstrate cannulation of the cystic duct remanent and opacification of the biliary  tree. There is mild biliary ductal dilatation. A small filling defect is present in the distal common bile duct. A trace amount of contrast material trickles around the filling defect in through the ampulla. IMPRESSION: 1. Positive for incompletely obstructing choledocholithiasis. 2. Mild biliary ductal dilatation. Electronically Signed   By: Malachy Moan M.D.   On: 07/06/2023 08:18    Anti-infectives: Anti-infectives (From admission, onward)     Start     Dose/Rate Route Frequency Ordered Stop   07/05/23 1830  piperacillin-tazobactam (ZOSYN) IVPB 3.375 g        3.375 g 12.5 mL/hr over 240 Minutes Intravenous Every 8 hours 07/05/23 1823 07/10/23 2159   07/05/23 0600  cefTRIAXone (ROCEPHIN) 2 g in sodium chloride 0.9 % 100 mL IVPB  Status:  Discontinued        2 g 200 mL/hr over 30 Minutes Intravenous Every 24 hours 07/04/23 0719 07/05/23 1823   07/04/23 0800  metroNIDAZOLE (FLAGYL) IVPB 500 mg  Status:  Discontinued        500 mg 100 mL/hr over 60 Minutes Intravenous Every 12 hours 07/04/23 0719 07/05/23 1823   07/04/23 0600  cefTRIAXone (ROCEPHIN) 2 g in sodium chloride 0.9 % 100 mL IVPB        2 g 200 mL/hr over 30 Minutes Intravenous  Once 07/04/23 0551 07/04/23 0710        Assessment/Plan  POD2 s/p laparoscopic cholecystectomy with IOC Acute cholecystitis with choledocholithiasis - s/p ERCP yesterday  - tolerating CLD without n/v, no bowel function yet - mobilize as able and ok to advance diet as tolerated from surgery standpoint  - continue drain for now  - ok to resume eliquis once hgb stabilizes   FEN: CLD - ok to advance as tolerated, IVF per primary  VTE: SCDs ID: [email or web address]  LOS: 3 days     Juliet Rude, [facility] 07/07/2023, 10:06 AM Please see Amion for pager number during day hours 7:00am-4:30pm

## 2023-07-07 NOTE — Progress Notes (Signed)
[facility] Progress Note  SUBJECTIVE:   Interval history: Todd Estrada was seen and evaluated today at bedside. Resting comfortably watching television in chair. Denied abdominal pain. No nausea or vomiting. No shortness of breath. No chest pain. Did note some heart palpitations. Wanting to have liquids.   Past Medical History:  Diagnosis Date   Asthma    Atypical chest pain 08/13/2022   Community acquired pneumonia 09/14/2022   COPD (chronic obstructive pulmonary disease) (HCC)    Essential hypertension 08/19/2021   GERD (gastroesophageal reflux disease)    HAP (hospital-acquired pneumonia) 09/16/2022   History of tracheostomy    03/09/22-04/11/22   HLD (hyperlipidemia)    Hypertension    Hypokalemia 08/13/2022   Hypomagnesemia 08/13/2022   Lung cancer (HCC)    PAD (peripheral artery disease) (HCC)    Seizures (HCC) 06/02/2022   Sepsis (HCC) 08/13/2022   Sepsis due to pneumonia (HCC) 04/13/2023   Stroke (HCC) 02/2022   Past Surgical History:  Procedure Laterality Date   BRONCHIAL BIOPSY  05/30/2022   Procedure: BRONCHIAL BIOPSIES;  Surgeon: Leslye Peer, MD;  Location: [facility] ENDOSCOPY;  Service: Pulmonary;;   BRONCHIAL BRUSHINGS  05/30/2022   Procedure: BRONCHIAL BRUSHINGS;  Surgeon: Leslye Peer, MD;  Location: [facility] ENDOSCOPY;  Service: Pulmonary;;   BRONCHIAL NEEDLE ASPIRATION BIOPSY  05/30/2022   Procedure: BRONCHIAL NEEDLE ASPIRATION BIOPSIES;  Surgeon: Leslye Peer, MD;  Location: [facility] ENDOSCOPY;  Service: Pulmonary;;   BRONCHIAL WASHINGS  05/30/2022   Procedure: BRONCHIAL WASHINGS;  Surgeon: Leslye Peer, MD;  Location: [facility] ENDOSCOPY;  Service: Pulmonary;;   CHOLECYSTECTOMY N/A 07/05/2023   Procedure: LAPAROSCOPIC CHOLECYSTECTOMY WITH INTRAOPERATIVE CHOLANGIOGRAM;  Surgeon: Karie Soda, MD;  Location: [facility] ORS;  Service: General;  Laterality: N/A;   ESOPHAGOGASTRODUODENOSCOPY (EGD) WITH PROPOFOL N/A 03/11/2022   Procedure: ESOPHAGOGASTRODUODENOSCOPY (EGD) WITH  PROPOFOL;  Surgeon: Violeta Gelinas, MD;  Location: [facility] ENDOSCOPY;  Service: General;  Laterality: N/A;   FIDUCIAL MARKER PLACEMENT  05/30/2022   Procedure: FIDUCIAL MARKER PLACEMENT;  Surgeon: Leslye Peer, MD;  Location: [facility] ENDOSCOPY;  Service: Pulmonary;;   IR ANGIO INTRA EXTRACRAN SEL COM CAROTID INNOMINATE UNI R MOD SED  03/02/2022   IR CT HEAD LTD  03/02/2022   IR PERCUTANEOUS ART THROMBECTOMY/INFUSION INTRACRANIAL INC DIAG ANGIO  03/02/2022   PEG PLACEMENT N/A 03/11/2022   Procedure: PERCUTANEOUS ENDOSCOPIC GASTROSTOMY (PEG) PLACEMENT;  Surgeon: Violeta Gelinas, MD;  Location: [facility] ENDOSCOPY;  Service: General;  Laterality: N/A;   RADIOLOGY WITH ANESTHESIA N/A 03/02/2022   Procedure: IR WITH ANESTHESIA;  Surgeon: Julieanne Cotton, MD;  Location: [facility] OR;  Service: Radiology;  Laterality: N/A;   VIDEO BRONCHOSCOPY WITH RADIAL ENDOBRONCHIAL ULTRASOUND  05/30/2022   Procedure: VIDEO BRONCHOSCOPY WITH RADIAL ENDOBRONCHIAL ULTRASOUND;  Surgeon: Leslye Peer, MD;  Location: [facility] ENDOSCOPY;  Service: Pulmonary;;   Current Facility-Administered Medications  Medication Dose Route Frequency Provider Last Rate Last Admin   acetaminophen (TYLENOL) tablet 650 mg  650 mg Oral Q6H PRN Vida Rigger, MD   650 mg at 07/04/23 2142   Or   acetaminophen (TYLENOL) suppository 650 mg  650 mg Rectal Q6H PRN Vida Rigger, MD       arformoterol (BROVANA) nebulizer solution 15 mcg  15 mcg Nebulization BID Vida Rigger, MD   15 mcg at 07/07/23 0847   And   umeclidinium bromide (INCRUSE ELLIPTA) 62.5 MCG/ACT 1 puff  1 puff Inhalation Daily Vida Rigger, MD   1 puff at 07/07/23 0847   fentaNYL (SUBLIMAZE) injection 25  mcg  25 mcg Intravenous Q2H PRN Vida Rigger, MD       levETIRAcetam (KEPPRA) tablet 500 mg  500 mg Oral BID Vida Rigger, MD   500 mg at 07/07/23 1003   midodrine (PROAMATINE) tablet 2.5 mg  2.5 mg Oral BID WC Vida Rigger, MD   2.5 mg at 07/07/23 0800   ondansetron (ZOFRAN) tablet 4 mg  4 mg Oral Q6H PRN Vida Rigger, MD       Or   ondansetron [facility]) injection 4 mg  4 mg Intravenous Q6H PRN Vida Rigger, MD       Oral care mouth rinse  15 mL Mouth Rinse PRN Vida Rigger, MD       pantoprazole (PROTONIX) injection 40 mg  40 mg Intravenous Q24H Vida Rigger, MD   40 mg at 07/07/23 0801   piperacillin-tazobactam (ZOSYN) IVPB 3.375 g  3.375 g Intravenous Jonnie Kind, MD 12.5 mL/hr at 07/07/23 0439 3.375 g at 07/07/23 0439   sodium phosphate (FLEET) 7-19 GM/118ML enema 1 enema  1 enema Rectal Once PRN Vida Rigger, MD       Allergies as of 07/04/2023   (No Known Allergies)   Review of Systems:  Review of Systems  Respiratory:  Negative for shortness of breath.   Cardiovascular:  Positive for palpitations. Negative for chest pain.  Gastrointestinal:  Negative for abdominal pain, nausea and vomiting.    OBJECTIVE:   Temp:  [97.8 F (36.6 C)-98.7 F (37.1 C)] 98.7 F (37.1 C) (07/19 0455) Pulse Rate:  [65-80] 69 (07/19 0455) Resp:  [12-22] 18 (07/18 2016) BP: (88-145)/(43-84) 112/65 (07/19 0455) SpO2:  [94 %-100 %] 97 % (07/19 0851) Last BM Date :  (Unknown) Physical Exam Constitutional:      General: He is not in acute distress.    Appearance: He is not ill-appearing, toxic-appearing or diaphoretic.  Cardiovascular:     Rate and Rhythm: Normal rate and regular rhythm.  Pulmonary:     Effort: No respiratory distress.     Breath sounds: Normal breath sounds.  Abdominal:     General: Bowel sounds are normal. There is no distension.     Palpations: Abdomen is soft.     Tenderness: There is no abdominal tenderness. There is no guarding.     Comments: JP drain appreciated with serosanguinous contents.   Neurological:     Mental Status: He is alert.     Labs: Recent Labs    07/05/23 0511 07/06/23 0550 07/07/23 0537  WBC 9.2 8.7 8.2  HGB 11.6* 12.6* 10.7*  HCT 37.1* 39.7 34.1*  PLT 140* 148* 151   BMET Recent Labs    07/05/23 0511 07/06/23 0550 07/07/23 0537  NA 134* 138 136   K 3.6 3.9 4.1  CL 103 100 102  CO2 24 29 27   GLUCOSE 108* 153* 131*  BUN 9 12 20   CREATININE 0.72 0.82 0.87  CALCIUM 8.0* 8.6* 8.1*   LFT Recent Labs    07/07/23 0537  PROT 6.3*  ALBUMIN 2.7*  AST 36  ALT 41  ALKPHOS 61  BILITOT 0.4   PT/INR Recent Labs    07/06/23 0550  LABPROT 16.5*  INR 1.3*   Diagnostic imaging: DG ERCP  Result Date: 07/06/2023 CLINICAL DATA:  [id number] Surgery, elective [id number] EXAM: ERCP TECHNIQUE: Multiple spot images obtained with the fluoroscopic device and submitted for interpretation post-procedure. COMPARISON:  Intraop cholangiogram from previous day FINDINGS: A series of fluoroscopic spot images document endoscopic cannulation and  opacification of the CBD, with passage of balloon tipped catheter. No extravasation. Incomplete opacification of the intrahepatic biliary tree which appears mildly distended centrally. IMPRESSION: Endoscopic CBD cannulation and intervention as above. Electronically Signed   By: Corlis Leak M.D.   On: 07/06/2023 16:23   DG Cholangiogram Operative  Result Date: 07/06/2023 CLINICAL DATA:  Intraoperative cholangiogram for elective cholecystectomy EXAM: INTRAOPERATIVE CHOLANGIOGRAM TECHNIQUE: Cholangiographic images from the C-arm fluoroscopic device were submitted for interpretation post-operatively. Please see the procedural report for the amount of contrast and the fluoroscopy time utilized. FLUOROSCOPY: Radiation Exposure Index (as provided by the fluoroscopic device): 6.91 mGy Kerma COMPARISON:  Abdominal ultrasound 07/04/2023 FINDINGS: 2 cine clips are submitted for review. The images demonstrate cannulation of the cystic duct remanent and opacification of the biliary tree. There is mild biliary ductal dilatation. A small filling defect is present in the distal common bile duct. A trace amount of contrast material trickles around the filling defect in through the ampulla. IMPRESSION: 1. Positive for incompletely obstructing  choledocholithiasis. 2. Mild biliary ductal dilatation. Electronically Signed   By: Malachy Moan M.D.   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

## 2023-07-07 NOTE — Progress Notes (Signed)
PROGRESS NOTE    [name]  QMV:[id number] DOB: Apr 10, 1961 DOA: 07/04/2023 PCP: [name], MD   Brief Narrative:  Todd Estrada is a 62 y.o. 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 Initially evaluated in ED for abdominal pain/vomiting on 14th with normal labs/evaluation - discharged home with symptomatic management. 7/16 worsening symptoms - nausea/vomiting/dizziness culminating in a fall.  Assessment & Plan:   Principal Problem:   Acute cholecystitis Active Problems:   History of pulmonary embolism   Hx of ischemic left MCA stroke   History of seizures   Aphasia as late effect of cerebrovascular accident   Dyslipidemia   Emphysema lung (HCC)   Chronic heart failure with mildly reduced ejection fraction (HFmrEF) (HCC)   Chronic hypotension    Acute calculus cholecystitis S/p lap chole with intraoperative cholangiogram w/ Dr. Michaell Cowing 7/17 Obstructing choledocholithiasis [name] ERCP 18th with multiple cholelithiases removed. AST/ALT downtrending appropriately - now WNL Surgical JP drain to remain in place for 10 days post ERCP - (through 7/26) Currently on IV Zosyn, plan for total of 5 days. (Through 7/21) Continue IV fluid, IV analgesics, IV antiemetics Advance diet as tolerated  History of PE Eliquis to restart on 20th assuming no complications (2 days post ERCP)   H/o ischemic left MCA stroke Chronic ambulatory dysfunction HLD Residual aphasia and right-sided hemiparesis - transfers to wheelchair with assist at baseline Continue supportive care Lipitor on hold.   COPD Respiratory status stable.  Echo without evidence of RV dysfunction or severe PH   History of seizures Continue levetiracetam 500 mg p.o. twice daily.   Chronic hypotension Continue midodrine 2.5 mg p.o. twice daily. Hypotensive event 7/19 - BP  80s/50s - likely due to held midodrine dose the night prior   DVT prophylaxis: SCDs Start: 07/04/23 0723 Code Status:   Code Status: DNR Family Communication: None present  Status is: Inpt  Dispo: The patient is from: Home              Anticipated d/c is to: Home              Anticipated d/c date is: 24-48h              Patient currently NOT medically stable for discharge  Consultants:  GI, Gen Sx  Procedures:  JP drain placement ERCP  Antimicrobials:  Zosyn - stop date 7/21   Subjective: No acute issues overnight, tolerating PO well, denies abdominal pain  Objective: Vitals:   07/06/23 1759 07/06/23 1838 07/06/23 2016 07/07/23 0455  BP: 120/69  (!) 113/43 112/65  Pulse:   75 69  Resp: 16  18   Temp: 98.4 F (36.9 C)  98.7 F (37.1 C) 98.7 F (37.1 C)  TempSrc: Oral  Oral Oral  SpO2: 98% 97% 100% 97%  Weight:      Height:        Intake/Output Summary (Last 24 hours) at 07/07/2023 0752 Last data filed at 07/07/2023 0400 Gross per 24 hour  Intake 1040 ml  Output 1224.5 ml  Net -184.5 ml   Filed Weights   07/04/23 0017 07/05/23 1429  Weight: 74.8 kg 74.8 kg    Examination:  General exam: Appears calm and comfortable  Respiratory system: Clear to auscultation. Respiratory effort normal. Cardiovascular system: S1 & S2 heard, RRR. No JVD, murmurs, rubs, gallops or clicks. No pedal edema.  Gastrointestinal system: Abdomen is nondistended, soft and nontender. JP drain site clean/dry/intact. Central nervous system: Awake and alert - R sided weakness(baseline) noted. Aphasic intermittently but can answer yes/no/simple questions appropriately for the most part.  Data Reviewed: I have personally reviewed following labs and imaging studies  CBC: Recent Labs  Lab 07/02/23 0043 07/04/23 0158 07/05/23 0511 07/06/23 0550 07/07/23 0537  WBC 8.3 13.4* 9.2 8.7 8.2  NEUTROABS 7.0 11.3*  --   --   --   HGB 13.4 14.1 11.6* 12.6* 10.7*  HCT 42.6 44.3 37.1* 39.7 34.1*   MCV 92.8 93.7 93.0 92.1 93.4  PLT 156 147* 140* 148* 151   Basic Metabolic Panel: Recent Labs  Lab 07/02/23 0043 07/04/23 0158 07/04/23 0850 07/05/23 0511 07/06/23 0550  NA 139 138  --  134* 138  K 3.6 3.6  --  3.6 3.9  CL 102 102  --  103 100  CO2 28 28  --  24 29  GLUCOSE 128* 116*  --  108* 153*  BUN 16 14  --  9 12  CREATININE 0.87 0.95  --  0.72 0.82  CALCIUM 9.0 9.3  --  8.0* 8.6*  MG  --   --  1.8  --   --   PHOS  --   --  3.1  --   --    GFR: Estimated Creatinine Clearance: 97.6 mL/min (by C-G formula based on SCr of 0.82 mg/dL). Liver Function Tests: Recent Labs  Lab 07/02/23 0043 07/04/23 0158 07/05/23 0511 07/06/23 0550  AST 17 19 20  78*  ALT 13 15 19  53*  ALKPHOS 90 92 65 81  BILITOT 0.5 1.7* 0.9 0.5  PROT 7.7 8.5* 6.2* 6.8  ALBUMIN 3.9 4.2 2.9* 2.9*   Recent Labs  Lab 07/02/23 0043 07/04/23 0158  LIPASE 27 23   Coagulation Profile: Recent Labs  Lab 07/04/23 0850 07/06/23 0550  INR 1.5* 1.3*   Lipid Profile: Recent Labs    07/05/23 0511  CHOL 80  HDL 37*  LDLCALC 33  TRIG 49  CHOLHDL 2.2   Sepsis Labs: Recent Labs  Lab 07/02/23 0043 07/04/23 0158  LATICACIDVEN 1.3 1.3    Recent Results (from the past 240 hour(s))  Blood culture (routine x 2)     Status: None (Preliminary result)   Collection Time: 07/04/23  1:58 AM   Specimen: BLOOD  Result Value Ref Range Status   Specimen Description   Final    BLOOD BLOOD LEFT ARM Performed at [facility], [address] [place], [place] [address]    Special Requests   Final    BOTTLES DRAWN AEROBIC AND ANAEROBIC Blood Culture adequate volume Performed at [facility], [address], [place], [place] [address]    Culture   Final    NO GROWTH 2 DAYS Performed at [facility], [address], [place], [place] [address]    Report Status PENDING  Incomplete  Blood culture (routine x 2)     Status: None (Preliminary result)   Collection  Time: 07/04/23  1:58 AM   Specimen: BLOOD  Result Value Ref Range Status   Specimen Description   Final    BLOOD BLOOD RIGHT ARM Performed at [facility], [address], [place], [place] [address]    Special Requests   Final    BOTTLES DRAWN AEROBIC AND ANAEROBIC Blood Culture results may not be optimal due to an excessive volume of blood  received in culture bottles Performed at [facility], [address] [place], [place] [address]    Culture   Final    NO GROWTH 2 DAYS Performed at [facility], [address], [place], [place] [address]    Report Status PENDING  Incomplete  Resp panel by RT-PCR (RSV, Flu A&B, Covid) Peripheral     Status: None   Collection Time: 07/04/23  1:58 AM   Specimen: Peripheral; Nasal Swab  Result Value Ref Range Status   SARS Coronavirus 2 by RT PCR NEGATIVE NEGATIVE Final    Comment: (NOTE) SARS-CoV-2 target nucleic acids are NOT DETECTED.  The SARS-CoV-2 RNA is generally detectable in upper respiratory specimens during the acute phase of infection. The lowest concentration of SARS-CoV-2 viral copies this assay can detect is 138 copies/mL. A negative result does not preclude SARS-Cov-2 infection and should not be used as the sole basis for treatment or other patient management decisions. A negative result may occur with  improper specimen collection/handling, submission of specimen other than nasopharyngeal swab, presence of viral mutation(s) within the areas targeted by this assay, and inadequate number of viral copies(<138 copies/mL). A negative result must be combined with clinical observations, patient history, and epidemiological information. The expected result is Negative.  Fact Sheet for Patients:  BloggerCourse.com  Fact Sheet for Healthcare Providers:  SeriousBroker.it  This test is no t yet approved or cleared by the [place] FDA and   has been authorized for detection and/or diagnosis of SARS-CoV-2 by FDA under an Emergency Use Authorization (EUA). This EUA will remain  in effect (meaning this test can be used) for the duration of the COVID-19 declaration under Section 564(b)(1) of the Act, 21 U.S.C.section 360bbb-3(b)(1), unless the authorization is terminated  or revoked sooner.       Influenza A by PCR NEGATIVE NEGATIVE Final   Influenza B by PCR NEGATIVE NEGATIVE Final    Comment: (NOTE) The Xpert Xpress SARS-CoV-2/FLU/RSV plus assay is intended as an aid in the diagnosis of influenza from Nasopharyngeal swab specimens and should not be used as a sole basis for treatment. Nasal washings and aspirates are unacceptable for Xpert Xpress SARS-CoV-2/FLU/RSV testing.  Fact Sheet for Patients: BloggerCourse.com  Fact Sheet for Healthcare Providers: SeriousBroker.it  This test is not yet approved or cleared by the [place] FDA and has been authorized for detection and/or diagnosis of SARS-CoV-2 by FDA under an Emergency Use Authorization (EUA). This EUA will remain in effect (meaning this test can be used) for the duration of the COVID-19 declaration under Section 564(b)(1) of the Act, 21 U.S.C. section 360bbb-3(b)(1), unless the authorization is terminated or revoked.     Resp Syncytial Virus by PCR NEGATIVE NEGATIVE Final    Comment: (NOTE) Fact Sheet for Patients: BloggerCourse.com  Fact Sheet for Healthcare Providers: SeriousBroker.it  This test is not yet approved or cleared by the [place] FDA and has been authorized for detection and/or diagnosis of SARS-CoV-2 by FDA under an Emergency Use Authorization (EUA). This EUA will remain in effect (meaning this test can be used) for the duration of the COVID-19 declaration under Section 564(b)(1) of the Act, 21 U.S.C. section 360bbb-3(b)(1),  unless the authorization is terminated or revoked.  Performed at [facility], [address], [place], [place] [address]   Surgical pcr screen     Status: None   Collection Time: 07/04/23  9:37 PM   Specimen: Nasal Mucosa; Nasal Swab  Result Value Ref  Range Status   MRSA, PCR NEGATIVE NEGATIVE Final   Staphylococcus aureus NEGATIVE NEGATIVE Final    Comment: (NOTE) The Xpert SA Assay (FDA approved for NASAL specimens in patients 62 years of age and older), is one component of a comprehensive surveillance program. It is not intended to diagnose infection nor to guide or monitor treatment. Performed at [facility], [address] [place], [place] [address]          Radiology Studies: DG ERCP  Result Date: 07/06/2023 CLINICAL DATA:  [id number] Surgery, elective [id number] EXAM: ERCP TECHNIQUE: Multiple spot images obtained with the fluoroscopic device and submitted for interpretation post-procedure. COMPARISON:  Intraop cholangiogram from previous day FINDINGS: A series of fluoroscopic spot images document endoscopic cannulation and opacification of the CBD, with passage of balloon tipped catheter. No extravasation. Incomplete opacification of the intrahepatic biliary tree which appears mildly distended centrally. IMPRESSION: Endoscopic CBD cannulation and intervention as above. Electronically Signed   By: Corlis Leak M.D.   On: 07/06/2023 16:23   DG Cholangiogram Operative  Result Date: 07/06/2023 CLINICAL DATA:  Intraoperative cholangiogram for elective cholecystectomy EXAM: INTRAOPERATIVE CHOLANGIOGRAM TECHNIQUE: Cholangiographic images from the C-arm fluoroscopic device were submitted for interpretation post-operatively. Please see the procedural report for the amount of contrast and the fluoroscopy time utilized. FLUOROSCOPY: Radiation Exposure Index (as provided by the fluoroscopic device): 6.91 mGy Kerma COMPARISON:  Abdominal ultrasound 07/04/2023  FINDINGS: 2 cine clips are submitted for review. The images demonstrate cannulation of the cystic duct remanent and opacification of the biliary tree. There is mild biliary ductal dilatation. A small filling defect is present in the distal common bile duct. A trace amount of contrast material trickles around the filling defect in through the ampulla. IMPRESSION: 1. Positive for incompletely obstructing choledocholithiasis. 2. Mild biliary ductal dilatation. Electronically Signed   By: Malachy Moan M.D.   On: 07/06/2023 08:18    Scheduled Meds:  arformoterol  15 mcg Nebulization BID   And   umeclidinium bromide  1 puff Inhalation Daily   levETIRAcetam  500 mg Oral BID   midodrine  2.5 mg Oral BID WC   pantoprazole (PROTONIX) IV  40 mg Intravenous Q24H   Continuous Infusions:  piperacillin-tazobactam (ZOSYN)  IV 3.375 g (07/07/23 0439)     LOS: 3 days   Time spent: 52 min  Azucena Fallen, DO [facility]  If 7PM-7AM, please contact night-coverage [email or web address]  07/07/2023, 7:52 AM

## 2023-07-07 NOTE — Evaluation (Signed)
Physical Therapy Evaluation Patient Details Name: Todd Estrada MRN: [id number] DOB: Apr 13, 1961 Today's Date: 07/07/2023  History of Present Illness  Todd Estrada is a 62 y.o. male admitted with acute cholecystitis. Pt s/p s/p laparoscopic cholecystectomy with IOC 7/17 and ERCP 7/18. PMH:  asthma, atypical chest pain, COPD, GERD, tracheostomy, HTN, lung cancer, PAD, seizure, stroke  Clinical Impression  Pt admitted with above diagnosis. Pt with expressive aphasia, does state "no" and "yes" to some questions that seem appropriate to living with family, having someone to assist and using w/c. Pt with noted R sided weakness. Pt needing min A to come to sitting EOB and scoot out fully to place feet on ground. Mod A to initiate lateral scoot to drop arm recliner, then min A to continue motion with use of bedpad. No family present, anticipate return home with HHPT and family support; if family unable to provide support, pt may need therapy <3 hours/day prior to return with family support. Pt currently with functional limitations due to the deficits listed below (see PT Problem List). Pt will benefit from acute skilled PT to increase their independence and safety with mobility to allow discharge.           Assistance Recommended at Discharge Frequent or constant Supervision/Assistance  If plan is discharge home, recommend the following:  Can travel by private vehicle  A little help with walking and/or transfers;A little help with bathing/dressing/bathroom;Assistance with cooking/housework;Assist for transportation        Equipment Recommendations None recommended by PT  Recommendations for Other Services       Functional Status Assessment Patient has had a recent decline in their functional status and demonstrates the ability to make significant improvements in function in a reasonable and predictable amount of time.     Precautions / Restrictions Precautions Precautions: Fall Precaution  Comments: drain Restrictions Weight Bearing Restrictions: No      Mobility  Bed Mobility Overal bed mobility: Needs Assistance Bed Mobility: Supine to Sit     Supine to sit: Min assist     General bed mobility comments: increased time, use of bedrails, min A to scoot out fully to EOB to place feet on floor    Transfers Overall transfer level: Needs assistance   Transfers: Bed to chair/wheelchair/BSC            Lateral/Scoot Transfers: Mod assist, Min assist General transfer comment: mod A to initiate lateral scoot then min A to complete lateral scoot bed to drop arm recliner using bedpad    Ambulation/Gait                  Stairs            Wheelchair Mobility     Tilt Bed    Modified Rankin (Stroke Patients Only)       Balance Overall balance assessment: Needs assistance Sitting-balance support: Feet supported Sitting balance-Leahy Scale: Fair                                       Pertinent Vitals/Pain Pain Assessment Pain Assessment: Faces Faces Pain Scale: No hurt    Home Living Family/patient expects to be discharged to:: Private residence Living Arrangements: Other relatives                 Additional Comments: no family present, pt has expressive aphasia and reports "yes" when asked if  he lives with family and if they're able to assist him. pt also reports "yes" to using a w/c    Prior Function Prior Level of Function : Patient poor historian/Family not available             Mobility Comments: pt reports using w/c ADLs Comments: pt reports family present to assist, doesn't give details     Hand Dominance        Extremity/Trunk Assessment   Upper Extremity Assessment Upper Extremity Assessment: RUE deficits/detail RUE Deficits / Details: maintains R UE hanging at side, when cued to attend pt able to flex shoulder ~20 deg and flex elbow ~20 deg RUE: Shoulder pain with ROM    Lower Extremity  Assessment Lower Extremity Assessment: RLE deficits/detail;LLE deficits/detail RLE Deficits / Details: grossly 2-/5 throughout LLE Deficits / Details: AROM WFL, strength grossly 3+/5    Cervical / Trunk Assessment Cervical / Trunk Assessment: Normal  Communication   Communication: Expressive difficulties  Cognition Arousal/Alertness: Awake/alert Behavior During Therapy: Flat affect Overall Cognitive Status: Difficult to assess                                 General Comments: Pt with expressive aphasia, follows one step commands consistently        General Comments      Exercises     Assessment/Plan    PT Assessment Patient needs continued PT services  PT Problem List Decreased strength;Decreased activity tolerance;Decreased balance;Decreased mobility       PT Treatment Interventions DME instruction;Gait training;Functional mobility training;Therapeutic activities;Therapeutic exercise;Balance training;Neuromuscular re-education;Patient/family education    PT Goals (Current goals can be found in the Care Plan section)  Acute Rehab PT Goals Patient Stated Goal: pt wants to get into recliner PT Goal Formulation: With patient Time For Goal Achievement: 07/21/23 Potential to Achieve Goals: Good    Frequency Min 1X/week     Co-evaluation               AM-PAC PT "6 Clicks" Mobility  Outcome Measure Help needed turning from your back to your side while in a flat bed without using bedrails?: A Little Help needed moving from lying on your back to sitting on the side of a flat bed without using bedrails?: A Little Help needed moving to and from a bed to a chair (including a wheelchair)?: A Little Help needed standing up from a chair using your arms (e.g., wheelchair or bedside chair)?: A Little Help needed to walk in hospital room?: Total Help needed climbing 3-5 steps with a railing? : Total 6 Click Score: 14    End of Session Equipment Utilized  During Treatment: Gait belt Activity Tolerance: Patient tolerated treatment well Patient left: in chair;with call bell/phone within reach Nurse Communication: Mobility status PT Visit Diagnosis: Other abnormalities of gait and mobility (R26.89);Muscle weakness (generalized) (M62.81)    Time: 1001-1026 PT Time Calculation (min) (ACUTE ONLY): 25 min   Charges:   PT Evaluation $PT Eval Low Complexity: 1 Low PT Treatments $Therapeutic Activity: 8-22 mins PT General Charges $$ ACUTE PT VISIT: 1 Visit         Tori Ripley Lovecchio PT, DPT 07/07/23, 12:30 PM

## 2023-07-08 DIAGNOSIS — K810 Acute cholecystitis: Secondary | ICD-10-CM | POA: Diagnosis not present

## 2023-07-08 LAB — CULTURE, BLOOD (ROUTINE X 2)
Culture: NO GROWTH
Special Requests: ADEQUATE

## 2023-07-08 LAB — COMPREHENSIVE METABOLIC PANEL
ALT: 38 U/L (ref 0–44)
AST: 29 U/L (ref 15–41)
Albumin: 2.7 g/dL — ABNORMAL LOW (ref 3.5–5.0)
Alkaline Phosphatase: 62 U/L (ref 38–126)
Anion gap: 7 (ref 5–15)
BUN: 19 mg/dL (ref 8–23)
CO2: 27 mmol/L (ref 22–32)
Calcium: 8.2 mg/dL — ABNORMAL LOW (ref 8.9–10.3)
Chloride: 104 mmol/L (ref 98–111)
Creatinine, Ser: 0.81 mg/dL (ref 0.61–1.24)
GFR, Estimated: >60 mL/min (ref >60)
Glucose, Bld: 101 mg/dL — ABNORMAL HIGH (ref 70–99)
Potassium: 3.8 mmol/L (ref 3.5–5.1)
Sodium: 138 mmol/L (ref 135–145)
Total Bilirubin: 0.3 mg/dL (ref 0.3–1.2)
Total Protein: 6.4 g/dL — ABNORMAL LOW (ref 6.5–8.1)

## 2023-07-08 LAB — CBC
HCT: 38.4 % — ABNORMAL LOW (ref 39.0–52.0)
Hemoglobin: 11.7 g/dL — ABNORMAL LOW (ref 13.0–17.0)
MCH: 29.2 pg (ref 26.0–34.0)
MCHC: 30.5 g/dL (ref 30.0–36.0)
MCV: 95.8 fL (ref 80.0–100.0)
Platelets: 148 10*3/uL — ABNORMAL LOW (ref 150–400)
RBC: 4.01 MIL/uL — ABNORMAL LOW (ref 4.22–5.81)
RDW: 13.3 % (ref 11.5–15.5)
WBC: 7.7 10*3/uL (ref 4.0–10.5)
nRBC: 0 % (ref 0.0–0.2)

## 2023-07-08 MED ORDER — PANTOPRAZOLE SODIUM 40 MG PO TBEC
40.0000 mg | DELAYED_RELEASE_TABLET | Freq: Every day | ORAL | Status: DC
  Administered 2023-07-09: 40 mg via ORAL
  Filled 2023-07-08: qty 1

## 2023-07-08 MED ORDER — PNEUMOCOCCAL 20-VAL CONJ VACC 0.5 ML IM SUSY
0.5000 mL | PREFILLED_SYRINGE | INTRAMUSCULAR | Status: DC
  Filled 2023-07-08: qty 0.5

## 2023-07-08 NOTE — Progress Notes (Signed)
PROGRESS NOTE    Todd Estrada  HQI:[id number] DOB: [birth year] DOA: 07/04/2023 PCP: [name], MD   Brief Narrative:  Todd Estrada is a 62 y.o. 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 Initially evaluated in ED for abdominal pain/vomiting on 14th with normal labs/evaluation - discharged home with symptomatic management. 7/16 worsening symptoms - nausea/vomiting/dizziness culminating in a fall.   Assessment & Plan:   Acute calculus cholecystitis S/p lap chole with intraoperative cholangiogram w/ Dr. Michaell Cowing 7/17 Obstructing choledocholithiasis -[name] ERCP 18th with multiple cholelithiases removed. -AST/ALT downtrending appropriately - now WNL -Surgical JP drain to remain in place for 10 days post ERCP - (through 7/26) -Currently on IV Zosyn, plan for total of 5 days. (Through 7/21) -Continue IV fluid, IV analgesics, IV antiemetics -Advance diet as tolerated -GI recommended resume Eliquis and monitor H&H.  GI signed off.  Appreciated help -General surgery recommended DC home with drain in place, to be removed in office in 1 week after discharge. -Eliquis resumed, monitor H&H overnight.  DC home tomorrow  History of PE -Eliquis resumed on 7/20.  Monitor H&H.   H/o ischemic left MCA stroke Chronic ambulatory dysfunction HLD -Residual aphasia and right-sided hemiparesis - transfers to wheelchair with assist at baseline -Continue supportive care Lipitor on hold-Will resume at discharge   COPD -Respiratory status stable.  Echo without evidence of RV dysfunction or severe PH   History of seizures -Continue levetiracetam 500 mg p.o. twice daily.   Chronic hypotension -Continue midodrine 2.5 mg p.o. twice daily.  BP slightly on the lower range.  Will continue to monitor  DVT prophylaxis: Eliquis Code Status: DNR Family  Communication:  None present at bedside.  Plan of care discussed with patient in length and he verbalized understanding and agreed with it. Disposition Plan: Likely home tomorrow  Consultants:  GI General surgery  Procedures:  ERCP  Antimicrobials:  Zosyn  Status is: Inpatient     Subjective: Patient seen and examined.  Reports that he feels good today.  Denies any new complaints.  No abdominal pain, nausea, vomiting, no acute events overnight.  Remained afebrile.  Objective: Vitals:   07/07/23 1740 07/07/23 2108 07/08/23 0430 07/08/23 0727  BP:  101/66 99/65   Pulse:  61 60   Resp:  18 20   Temp:  98.3 F (36.8 C) 98.6 F (37 C)   TempSrc:  Oral Oral   SpO2: 95% 97% 94% 98%  Weight:      Height:        Intake/Output Summary (Last 24 hours) at 07/08/2023 1158 Last data filed at 07/08/2023 0925 Gross per 24 hour  Intake 1500 ml  Output 2021 ml  Net -521 ml   Filed Weights   07/04/23 0017 07/05/23 1429  Weight: 74.8 kg 74.8 kg    Examination:  General exam: Appears calm and comfortable, sitting comfortably on the bed, eating breakfast and watching TV. Respiratory system: Clear to auscultation. Respiratory effort normal. Cardiovascular system: S1 & S2 heard, RRR. No JVD, murmurs, rubs, gallops or clicks. No pedal edema. Gastrointestinal system: Abdomen is nondistended, soft and nontender. No organomegaly or masses felt. Normal bowel sounds heard.  JP drain site: No bleeding or discharge seen. Central nervous system: Alert, right-sided weakness (at baseline), slurred speech, able to answer simple questions. Skin: No rashes, lesions or ulcers    Data Reviewed: I  have personally reviewed following labs and imaging studies  CBC: Recent Labs  Lab 07/02/23 0043 07/04/23 0158 07/05/23 0511 07/06/23 0550 07/07/23 0537 07/08/23 0513  WBC 8.3 13.4* 9.2 8.7 8.2 7.7  NEUTROABS 7.0 11.3*  --   --   --   --   HGB 13.4 14.1 11.6* 12.6* 10.7* 11.7*  HCT 42.6 44.3  37.1* 39.7 34.1* 38.4*  MCV 92.8 93.7 93.0 92.1 93.4 95.8  PLT 156 147* 140* 148* 151 148*   Basic Metabolic Panel: Recent Labs  Lab 07/04/23 0158 07/04/23 0850 07/05/23 0511 07/06/23 0550 07/07/23 0537 07/08/23 0513  NA 138  --  134* 138 136 138  K 3.6  --  3.6 3.9 4.1 3.8  CL 102  --  103 100 102 104  CO2 28  --  24 29 27 27   GLUCOSE 116*  --  108* 153* 131* 101*  BUN 14  --  9 12 20 19   CREATININE 0.95  --  0.72 0.82 0.87 0.81  CALCIUM 9.3  --  8.0* 8.6* 8.1* 8.2*  MG  --  1.8  --   --   --   --   PHOS  --  3.1  --   --   --   --    GFR: Estimated Creatinine Clearance: 98.8 mL/min (by C-G formula based on SCr of 0.81 mg/dL). Liver Function Tests: Recent Labs  Lab 07/04/23 0158 07/05/23 0511 07/06/23 0550 07/07/23 0537 07/08/23 0513  AST 19 20 78* 36 29  ALT 15 19 53* 41 38  ALKPHOS 92 65 81 61 62  BILITOT 1.7* 0.9 0.5 0.4 0.3  PROT 8.5* 6.2* 6.8 6.3* 6.4*  ALBUMIN 4.2 2.9* 2.9* 2.7* 2.7*   Recent Labs  Lab 07/02/23 0043 07/04/23 0158  LIPASE 27 23   No results for input(s): "AMMONIA" in the last 168 hours. Coagulation Profile: Recent Labs  Lab 07/04/23 0850 07/06/23 0550  INR 1.5* 1.3*   Cardiac Enzymes: No results for input(s): "CKTOTAL", "CKMB", "CKMBINDEX", "TROPONINI" in the last 168 hours. BNP (last 3 results) No results for input(s): "PROBNP" in the last 8760 hours. HbA1C: No results for input(s): "HGBA1C" in the last 72 hours. CBG: No results for input(s): "GLUCAP" in the last 168 hours. Lipid Profile: No results for input(s): "CHOL", "HDL", "LDLCALC", "TRIG", "CHOLHDL", "LDLDIRECT" in the last 72 hours. Thyroid Function Tests: No results for input(s): "TSH", "T4TOTAL", "FREET4", "T3FREE", "THYROIDAB" in the last 72 hours. Anemia Panel: No results for input(s): "VITAMINB12", "FOLATE", "FERRITIN", "TIBC", "IRON", "RETICCTPCT" in the last 72 hours. Sepsis Labs: Recent Labs  Lab 07/02/23 0043 07/04/23 0158  LATICACIDVEN 1.3 1.3     Recent Results (from the past 240 hour(s))  Blood culture (routine x 2)     Status: None (Preliminary result)   Collection Time: 07/04/23  1:58 AM   Specimen: BLOOD  Result Value Ref Range Status   Specimen Description   Final    BLOOD BLOOD LEFT ARM Performed at [facility], [address], [place], [place] [address]    Special Requests   Final    BOTTLES DRAWN AEROBIC AND ANAEROBIC Blood Culture adequate volume Performed at [facility], [address], [place], [place] [address]    Culture   Final    NO GROWTH 4 DAYS Performed at [facility], [address] [place], [place] [address]    Report Status PENDING  Incomplete  Blood culture (routine x 2)  Status: None (Preliminary result)   Collection Time: 07/04/23  1:58 AM   Specimen: BLOOD  Result Value Ref Range Status   Specimen Description   Final    BLOOD BLOOD RIGHT ARM Performed at [facility], [address], [place], [place] [address]    Special Requests   Final    BOTTLES DRAWN AEROBIC AND ANAEROBIC Blood Culture results may not be optimal due to an excessive volume of blood received in culture bottles Performed at [facility], [address] [place], [place] [address]    Culture   Final    NO GROWTH 4 DAYS Performed at [facility], [address] [place], [place] [address]    Report Status PENDING  Incomplete  Resp panel by RT-PCR (RSV, Flu A&B, Covid) Peripheral     Status: None   Collection Time: 07/04/23  1:58 AM   Specimen: Peripheral; Nasal Swab  Result Value Ref Range Status   SARS Coronavirus 2 by RT PCR NEGATIVE NEGATIVE Final    Comment: (NOTE) SARS-CoV-2 target nucleic acids are NOT DETECTED.  The SARS-CoV-2 RNA is generally detectable in upper respiratory specimens during the acute phase of infection. The lowest concentration of SARS-CoV-2 viral copies this assay can detect is 138 copies/mL. A  negative result does not preclude SARS-Cov-2 infection and should not be used as the sole basis for treatment or other patient management decisions. A negative result may occur with  improper specimen collection/handling, submission of specimen other than nasopharyngeal swab, presence of viral mutation(s) within the areas targeted by this assay, and inadequate number of viral copies(<138 copies/mL). A negative result must be combined with clinical observations, patient history, and epidemiological information. The expected result is Negative.  Fact Sheet for Patients:  BloggerCourse.com  Fact Sheet for Healthcare Providers:  SeriousBroker.it  This test is no t yet approved or cleared by the [place] FDA and  has been authorized for detection and/or diagnosis of SARS-CoV-2 by FDA under an Emergency Use Authorization (EUA). This EUA will remain  in effect (meaning this test can be used) for the duration of the COVID-19 declaration under Section 564(b)(1) of the Act, 21 U.S.C.section 360bbb-3(b)(1), unless the authorization is terminated  or revoked sooner.       Influenza A by PCR NEGATIVE NEGATIVE Final   Influenza B by PCR NEGATIVE NEGATIVE Final    Comment: (NOTE) The Xpert Xpress SARS-CoV-2/FLU/RSV plus assay is intended as an aid in the diagnosis of influenza from Nasopharyngeal swab specimens and should not be used as a sole basis for treatment. Nasal washings and aspirates are unacceptable for Xpert Xpress SARS-CoV-2/FLU/RSV testing.  Fact Sheet for Patients: BloggerCourse.com  Fact Sheet for Healthcare Providers: SeriousBroker.it  This test is not yet approved or cleared by the [place] FDA and has been authorized for detection and/or diagnosis of SARS-CoV-2 by FDA under an Emergency Use Authorization (EUA). This EUA will remain in effect (meaning this test can  be used) for the duration of the COVID-19 declaration under Section 564(b)(1) of the Act, 21 U.S.C. section 360bbb-3(b)(1), unless the authorization is terminated or revoked.     Resp Syncytial Virus by PCR NEGATIVE NEGATIVE Final    Comment: (NOTE) Fact Sheet for Patients: BloggerCourse.com  Fact Sheet for Healthcare Providers: SeriousBroker.it  This test is not yet approved or cleared by the [place] FDA and has been authorized for detection and/or diagnosis of SARS-CoV-2 by FDA under an Emergency Use Authorization (EUA). This  EUA will remain in effect (meaning this test can be used) for the duration of the COVID-19 declaration under Section 564(b)(1) of the Act, 21 U.S.C. section 360bbb-3(b)(1), unless the authorization is terminated or revoked.  Performed at [facility], [address], [place], [place] [address]   Surgical pcr screen     Status: None   Collection Time: 07/04/23  9:37 PM   Specimen: Nasal Mucosa; Nasal Swab  Result Value Ref Range Status   MRSA, PCR NEGATIVE NEGATIVE Final   Staphylococcus aureus NEGATIVE NEGATIVE Final    Comment: (NOTE) The Xpert SA Assay (FDA approved for NASAL specimens in patients 80 years of age and older), is one component of a comprehensive surveillance program. It is not intended to diagnose infection nor to guide or monitor treatment. Performed at [facility], [address] [place], [place] [address]       Radiology Studies: DG ERCP  Result Date: 07/06/2023 CLINICAL DATA:  [id number] Surgery, elective [id number] EXAM: ERCP TECHNIQUE: Multiple spot images obtained with the fluoroscopic device and submitted for interpretation post-procedure. COMPARISON:  Intraop cholangiogram from previous day FINDINGS: A series of fluoroscopic spot images document endoscopic cannulation and opacification of the CBD, with passage of balloon tipped catheter.  No extravasation. Incomplete opacification of the intrahepatic biliary tree which appears mildly distended centrally. IMPRESSION: Endoscopic CBD cannulation and intervention as above. Electronically Signed   By: Corlis Leak M.D.   On: 07/06/2023 16:23    Scheduled Meds:  apixaban  5 mg Oral BID   arformoterol  15 mcg Nebulization BID   And   umeclidinium bromide  1 puff Inhalation Daily   levETIRAcetam  500 mg Oral BID   midodrine  2.5 mg Oral BID WC   [START ON 07/09/2023] pantoprazole  40 mg Oral Daily   [START ON 07/09/2023] pneumococcal 20-valent conjugate vaccine  0.5 mL Intramuscular Tomorrow-1000   Continuous Infusions:  piperacillin-tazobactam (ZOSYN)  IV 3.375 g (07/08/23 0606)     LOS: 4 days   Time spent: 35 minutes   Bettejane Leavens Estill Cotta, MD [facility]  If 7PM-7AM, please contact night-coverage [email or web address] 07/08/2023, 11:58 AM

## 2023-07-08 NOTE — Progress Notes (Signed)
[facility] Progress Note  SUBJECTIVE:   Interval history: Todd Estrada was seen and evaluated today at bedside.  Resting comfortably in bed.  Incontinent of stool, brown/orange.  Noted having some left-sided abdominal pain.  No chest pain or shortness of breath.  No nausea or vomiting.  Noted that he ate.  Past Medical History:  Diagnosis Date   Asthma    Atypical chest pain 08/13/2022   Community acquired pneumonia 09/14/2022   COPD (chronic obstructive pulmonary disease) (HCC)    Essential hypertension 08/19/2021   GERD (gastroesophageal reflux disease)    HAP (hospital-acquired pneumonia) 09/16/2022   History of tracheostomy    03/09/22-04/11/22   HLD (hyperlipidemia)    Hypertension    Hypokalemia 08/13/2022   Hypomagnesemia 08/13/2022   Lung cancer (HCC)    PAD (peripheral artery disease) (HCC)    Seizures (HCC) 06/02/2022   Sepsis (HCC) 08/13/2022   Sepsis due to pneumonia (HCC) 04/13/2023   Stroke (HCC) 02/2022   Past Surgical History:  Procedure Laterality Date   BRONCHIAL BIOPSY  05/30/2022   Procedure: BRONCHIAL BIOPSIES;  Surgeon: Leslye Peer, MD;  Location: [facility] ENDOSCOPY;  Service: Pulmonary;;   BRONCHIAL BRUSHINGS  05/30/2022   Procedure: BRONCHIAL BRUSHINGS;  Surgeon: Leslye Peer, MD;  Location: [facility] ENDOSCOPY;  Service: Pulmonary;;   BRONCHIAL NEEDLE ASPIRATION BIOPSY  05/30/2022   Procedure: BRONCHIAL NEEDLE ASPIRATION BIOPSIES;  Surgeon: Leslye Peer, MD;  Location: [facility] ENDOSCOPY;  Service: Pulmonary;;   BRONCHIAL WASHINGS  05/30/2022   Procedure: BRONCHIAL WASHINGS;  Surgeon: Leslye Peer, MD;  Location: [facility] ENDOSCOPY;  Service: Pulmonary;;   CHOLECYSTECTOMY N/A 07/05/2023   Procedure: LAPAROSCOPIC CHOLECYSTECTOMY WITH INTRAOPERATIVE CHOLANGIOGRAM;  Surgeon: Karie Soda, MD;  Location: [facility] ORS;  Service: General;  Laterality: N/A;   ERCP N/A 07/06/2023   Procedure: ENDOSCOPIC RETROGRADE CHOLANGIOPANCREATOGRAPHY (ERCP);  Surgeon: Vida Rigger,  MD;  Location: [facility] ENDOSCOPY;  Service: Gastroenterology;  Laterality: N/A;   ESOPHAGOGASTRODUODENOSCOPY (EGD) WITH PROPOFOL N/A 03/11/2022   Procedure: ESOPHAGOGASTRODUODENOSCOPY (EGD) WITH PROPOFOL;  Surgeon: Violeta Gelinas, MD;  Location: [facility] ENDOSCOPY;  Service: General;  Laterality: N/A;   FIDUCIAL MARKER PLACEMENT  05/30/2022   Procedure: FIDUCIAL MARKER PLACEMENT;  Surgeon: Leslye Peer, MD;  Location: [facility] ENDOSCOPY;  Service: Pulmonary;;   IR ANGIO INTRA EXTRACRAN SEL COM CAROTID INNOMINATE UNI R MOD SED  03/02/2022   IR CT HEAD LTD  03/02/2022   IR PERCUTANEOUS ART THROMBECTOMY/INFUSION INTRACRANIAL INC DIAG ANGIO  03/02/2022   PEG PLACEMENT N/A 03/11/2022   Procedure: PERCUTANEOUS ENDOSCOPIC GASTROSTOMY (PEG) PLACEMENT;  Surgeon: Violeta Gelinas, MD;  Location: [facility] ENDOSCOPY;  Service: General;  Laterality: N/A;   RADIOLOGY WITH ANESTHESIA N/A 03/02/2022   Procedure: IR WITH ANESTHESIA;  Surgeon: Julieanne Cotton, MD;  Location: [facility] OR;  Service: Radiology;  Laterality: N/A;   REMOVAL OF STONES  07/06/2023   Procedure: REMOVAL OF STONES;  Surgeon: Vida Rigger, MD;  Location: [facility] ENDOSCOPY;  Service: Gastroenterology;;   Dennison Mascot  07/06/2023   Procedure: Dennison Mascot;  Surgeon: Vida Rigger, MD;  Location: [facility] ENDOSCOPY;  Service: Gastroenterology;;   VIDEO BRONCHOSCOPY WITH RADIAL ENDOBRONCHIAL ULTRASOUND  05/30/2022   Procedure: VIDEO BRONCHOSCOPY WITH RADIAL ENDOBRONCHIAL ULTRASOUND;  Surgeon: Leslye Peer, MD;  Location: [facility] ENDOSCOPY;  Service: Pulmonary;;   Current Facility-Administered Medications  Medication Dose Route Frequency Provider Last Rate Last Admin   acetaminophen (TYLENOL) tablet 650 mg  650 mg Oral Q6H PRN Vida Rigger, MD   650 mg at 07/04/23 2142  Or   acetaminophen (TYLENOL) suppository 650 mg  650 mg Rectal Q6H PRN Vida Rigger, MD       apixaban Everlene Balls) tablet 5 mg  5 mg Oral BID Azucena Fallen, MD   5 mg at 07/08/23 0910   arformoterol (BROVANA)  nebulizer solution 15 mcg  15 mcg Nebulization BID Vida Rigger, MD   15 mcg at 07/08/23 8119   And   umeclidinium bromide (INCRUSE ELLIPTA) 62.5 MCG/ACT 1 puff  1 puff Inhalation Daily Vida Rigger, MD   1 puff at 07/08/23 0727   fentaNYL (SUBLIMAZE) injection 25 mcg  25 mcg Intravenous Q2H PRN Vida Rigger, MD       levETIRAcetam (KEPPRA) tablet 500 mg  500 mg Oral BID Vida Rigger, MD   500 mg at 07/08/23 0910   midodrine (PROAMATINE) tablet 2.5 mg  2.5 mg Oral BID WC Vida Rigger, MD   2.5 mg at 07/08/23 0909   ondansetron (ZOFRAN) tablet 4 mg  4 mg Oral Q6H PRN Vida Rigger, MD       Or   ondansetron [facility]) injection 4 mg  4 mg Intravenous Q6H PRN Vida Rigger, MD       Oral care mouth rinse  15 mL Mouth Rinse PRN Vida Rigger, MD       Melene Muller ON 07/09/2023] pantoprazole (PROTONIX) EC tablet 40 mg  40 mg Oral Daily Pahwani, Rinka R, MD       piperacillin-tazobactam (ZOSYN) IVPB 3.375 g  3.375 g Intravenous Q8H Magod, Vernia Buff, MD 12.5 mL/hr at 07/08/23 0606 3.375 g at 07/08/23 0606   [START ON 07/09/2023] pneumococcal 20-valent conjugate vaccine (PREVNAR 20) injection 0.5 mL  0.5 mL Intramuscular Tomorrow-1000 Pahwani, Rinka R, MD       sodium phosphate (FLEET) 7-19 GM/118ML enema 1 enema  1 enema Rectal Once PRN Vida Rigger, MD       Allergies as of 07/04/2023   (No Known Allergies)   Review of Systems:  Review of Systems  Respiratory:  Negative for shortness of breath.   Cardiovascular:  Negative for chest pain.  Gastrointestinal:  Positive for abdominal pain. Negative for nausea and vomiting.    OBJECTIVE:   Temp:  [98.3 F (36.8 C)-98.6 F (37 C)] 98.6 F (37 C) (07/20 0430) Pulse Rate:  [60-73] 60 (07/20 0430) Resp:  [16-20] 20 (07/20 0430) BP: (82-101)/(50-66) 99/65 (07/20 0430) SpO2:  [94 %-99 %] 98 % (07/20 0727) Last BM Date : 07/07/23 Physical Exam Constitutional:      General: He is not in acute distress.    Appearance: He is not ill-appearing, toxic-appearing or  diaphoretic.  Cardiovascular:     Rate and Rhythm: Normal rate and regular rhythm.  Pulmonary:     Effort: No respiratory distress.     Breath sounds: Normal breath sounds.  Abdominal:     General: Bowel sounds are normal. There is no distension.     Palpations: Abdomen is soft.     Tenderness: There is abdominal tenderness. There is no guarding.     Comments: JP drain noted with some bilious and sanguinous contents.  Neurological:     Mental Status: He is alert.     Labs: Recent Labs    07/06/23 0550 07/07/23 0537 07/08/23 0513  WBC 8.7 8.2 7.7  HGB 12.6* 10.7* 11.7*  HCT 39.7 34.1* 38.4*  PLT 148* 151 148*   BMET Recent Labs    07/06/23 0550 07/07/23 0537 07/08/23 0513  NA 138 136 138  K 3.9 4.1 3.8  CL 100 102 104  CO2 29 27 27   GLUCOSE 153* 131* 101*  BUN 12 20 19   CREATININE 0.82 0.87 0.81  CALCIUM 8.6* 8.1* 8.2*   LFT Recent Labs    07/08/23 0513  PROT 6.4*  ALBUMIN 2.7*  AST 29  ALT 38  ALKPHOS 62  BILITOT 0.3   PT/INR Recent Labs    07/06/23 0550  LABPROT 16.5*  INR 1.3*   Diagnostic imaging: DG ERCP  Result Date: 07/06/2023 CLINICAL DATA:  [id number] Surgery, elective [id number] EXAM: ERCP TECHNIQUE: Multiple spot images obtained with the fluoroscopic device and submitted for interpretation post-procedure. COMPARISON:  Intraop cholangiogram from previous day FINDINGS: A series of fluoroscopic spot images document endoscopic cannulation and opacification of the CBD, with passage of balloon tipped catheter. No extravasation. Incomplete opacification of the intrahepatic biliary tree which appears mildly distended centrally. IMPRESSION: Endoscopic CBD cannulation and intervention as above. Electronically Signed   By: Corlis Leak M.D.   On: 07/06/2023 16:23    IMPRESSION: Choledocholithiasis  -Status post ERCP 07/06/2023 with biliary sphincterotomy and removal choledocholithiasis Acute cholecystitis             -POD#3 s/p laparoscopic  cholecystectomy Transaminase elevation in hepatocellular pattern, resolved  Squamous cell lung cancer complicated by pulmonary embolism on Eliquis prior to admission CVA March 2023 with aphasia Peripheral artery disease COPD, on supplemental oxygen nasal cannula 2 liters  PLAN: -Okay to resume Eliquis today, trend H/H -Diet as tolerated -Your general surgery management -[name] will sign off and be available as needed should any questions arise   LOS: 4 days   Liliane Shi, DO [facility]

## 2023-07-08 NOTE — Progress Notes (Signed)
2 Days Post-Op   Subjective/Chief Complaint: Patient indicates no abdominal pain BM yesterday Tolerating regular diet   Objective: Vital signs in last 24 hours: Temp:  [98.3 F (36.8 C)-98.6 F (37 C)] 98.6 F (37 C) (07/20 0430) Pulse Rate:  [60-73] 60 (07/20 0430) Resp:  [16-20] 20 (07/20 0430) BP: (82-101)/(50-66) 99/65 (07/20 0430) SpO2:  [94 %-99 %] 98 % (07/20 0727) Last BM Date : 07/07/23  Intake/Output from previous day: 07/19 0701 - 07/20 0700 In: 1820 [P.O.:600; IV Piggyback:1220] Out: 2006 [Urine:2006] Intake/Output this shift: No intake/output data recorded.  WDWN in NAD Expressive aphasia No jaundice Abd - soft, non-tender, non-distended Incisions c/d/I Drain - output is serosanguinous with no sign of bile  Lab Results:  Recent Labs    07/07/23 0537 07/08/23 0513  WBC 8.2 7.7  HGB 10.7* 11.7*  HCT 34.1* 38.4*  PLT 151 148*   BMET Recent Labs    07/07/23 0537 07/08/23 0513  NA 136 138  K 4.1 3.8  CL 102 104  CO2 27 27  GLUCOSE 131* 101*  BUN 20 19  CREATININE 0.87 0.81  CALCIUM 8.1* 8.2*   PT/INR Recent Labs    07/06/23 0550  LABPROT 16.5*  INR 1.3*      Latest Ref Rng & Units 07/08/2023    5:13 AM 07/07/2023    5:37 AM 07/06/2023    5:50 AM  Hepatic Function  Total Protein 6.5 - 8.1 g/dL 6.4  6.3  6.8   Albumin 3.5 - 5.0 g/dL 2.7  2.7  2.9   AST 15 - 41 U/L 29  36  78   ALT 0 - 44 U/L 38  41  53   Alk Phosphatase 38 - 126 U/L 62  61  81   Total Bilirubin 0.3 - 1.2 mg/dL 0.3  0.4  0.5      Studies/Results: DG ERCP  Result Date: 07/06/2023 CLINICAL DATA:  [id number] Surgery, elective [id number] EXAM: ERCP TECHNIQUE: Multiple spot images obtained with the fluoroscopic device and submitted for interpretation post-procedure. COMPARISON:  Intraop cholangiogram from previous day FINDINGS: A series of fluoroscopic spot images document endoscopic cannulation and opacification of the CBD, with passage of balloon tipped catheter. No  extravasation. Incomplete opacification of the intrahepatic biliary tree which appears mildly distended centrally. IMPRESSION: Endoscopic CBD cannulation and intervention as above. Electronically Signed   By: Corlis Leak M.D.   On: 07/06/2023 16:23    Anti-infectives: Anti-infectives (From admission, onward)    Start     Dose/Rate Route Frequency Ordered Stop   07/05/23 1830  piperacillin-tazobactam (ZOSYN) IVPB 3.375 g        3.375 g 12.5 mL/hr over 240 Minutes Intravenous Every 8 hours 07/05/23 1823 07/10/23 2159   07/05/23 0600  cefTRIAXone (ROCEPHIN) 2 g in sodium chloride 0.9 % 100 mL IVPB  Status:  Discontinued        2 g 200 mL/hr over 30 Minutes Intravenous Every 24 hours 07/04/23 0719 07/05/23 1823   07/04/23 0800  metroNIDAZOLE (FLAGYL) IVPB 500 mg  Status:  Discontinued        500 mg 100 mL/hr over 60 Minutes Intravenous Every 12 hours 07/04/23 0719 07/05/23 1823   07/04/23 0600  cefTRIAXone (ROCEPHIN) 2 g in sodium chloride 0.9 % 100 mL IVPB        2 g 200 mL/hr over 30 Minutes Intravenous  Once 07/04/23 0551 07/04/23 0710       Assessment/Plan: POD3 s/p laparoscopic cholecystectomy with IOC -  07/05/23 - Dr. Michaell Cowing Acute cholecystitis with choledocholithiasis - s/p ERCP 07/06/23 - tolerating CLD without n/v, no bowel function yet - mobilize as able from surgery standpoint  - continue drain for now  - ok to resume eliquis once hgb stabilizes    FEN: Reg diet, IVF per primary  VTE: SCDs ID: [email or web address]  Discharge planning per primary team.  Will go home with drain in place - to be removed in office in 1 week after discharge  LOS: 4 days    Wynona Luna 07/08/2023

## 2023-07-09 DIAGNOSIS — K810 Acute cholecystitis: Secondary | ICD-10-CM | POA: Diagnosis not present

## 2023-07-09 LAB — CBC
HCT: 40.9 % (ref 39.0–52.0)
Hemoglobin: 13.2 g/dL (ref 13.0–17.0)
MCH: 30.1 pg (ref 26.0–34.0)
MCHC: 32.3 g/dL (ref 30.0–36.0)
MCV: 93.4 fL (ref 80.0–100.0)
Platelets: 199 10*3/uL (ref 150–400)
RBC: 4.38 MIL/uL (ref 4.22–5.81)
RDW: 13.2 % (ref 11.5–15.5)
WBC: 9.6 10*3/uL (ref 4.0–10.5)
nRBC: 0 % (ref 0.0–0.2)

## 2023-07-09 LAB — COMPREHENSIVE METABOLIC PANEL
ALT: 35 U/L (ref 0–44)
AST: 28 U/L (ref 15–41)
Albumin: 3 g/dL — ABNORMAL LOW (ref 3.5–5.0)
Alkaline Phosphatase: 68 U/L (ref 38–126)
Anion gap: 8 (ref 5–15)
BUN: 16 mg/dL (ref 8–23)
CO2: 28 mmol/L (ref 22–32)
Calcium: 8.6 mg/dL — ABNORMAL LOW (ref 8.9–10.3)
Chloride: 100 mmol/L (ref 98–111)
Creatinine, Ser: 0.93 mg/dL (ref 0.61–1.24)
GFR, Estimated: >60 mL/min (ref >60)
Glucose, Bld: 99 mg/dL (ref 70–99)
Potassium: 4 mmol/L (ref 3.5–5.1)
Sodium: 136 mmol/L (ref 135–145)
Total Bilirubin: 0.4 mg/dL (ref 0.3–1.2)
Total Protein: 6.9 g/dL (ref 6.5–8.1)

## 2023-07-09 MED ORDER — OXYCODONE-ACETAMINOPHEN 5-325 MG PO TABS: 1.0000 | ORAL_TABLET | Freq: Three times a day (TID) | ORAL | 0 refills | Status: AC | PRN

## 2023-07-09 MED ORDER — PANTOPRAZOLE SODIUM 40 MG PO TBEC: 40.0000 mg | DELAYED_RELEASE_TABLET | Freq: Every day | ORAL | 0 refills | Status: DC

## 2023-07-09 MED ORDER — MIDODRINE HCL 2.5 MG PO TABS: 2.5000 mg | ORAL_TABLET | Freq: Two times a day (BID) | ORAL | 0 refills | Status: AC

## 2023-07-09 NOTE — Progress Notes (Signed)
Pt D/c to home with cousin, Fayrene Fearing. Pt refused HH.

## 2023-07-09 NOTE — Discharge Summary (Signed)
Physician Discharge Summary  Todd Estrada RKY:[id number] DOB: 11/21/1961 DOA: 07/04/2023  PCP: [name], MD  Admit date: 07/04/2023 Discharge date: 07/09/2023  Admitted From: Home Disposition:  Home  Recommendations for Outpatient Follow-up:  Follow up general surgery in 1 week for drain removal  Home Health: Offered but patient refused Equipment/Devices: None Discharge Condition: Stable CODE STATUS: DNR Diet recommendation: Heart healthy diet  Brief/Interim Summary: Todd Estrada is a 62 y.o. 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 Initially evaluated in ED for abdominal pain/vomiting on 14th with normal labs/evaluation - discharged home with symptomatic management. 7/16 worsening symptoms - nausea/vomiting/dizziness culminating in a fall.  Admitted for further evaluation and management of acute cholecystitis and choledocholithiasis.  GI and general surgery consulted.  Patient admitted by [facility]  Acute calculus cholecystitis Obstructing choledocholithiasis S/p lap chole with intraoperative cholangiogram w/ Dr. Michaell Cowing 7/17 -Underwent ERCP with GI on 07/06/2023 with biliary sphincterectomy and removal of choledocholithiasis -AST/ALT downtrending appropriately - now WNL -Surgical JP drain to remain in place for 10 days post ERCP - (through 7/26) -Finished Zosyn course for 5 days -Eliquis resumed on 07/08/2023.  H&H remained stable.  He remained afebrile. -Per GS: Discharge home with drain and follow-up with general surgery outpatient in 1 week.  GI signed off.    History of PE -Eliquis resumed on 7/20.  H&H remained stable  H/o ischemic left MCA stroke -Residual aphasia and right-sided hemiparesis - transfers to wheelchair with assist at baseline -Resumed statin  COPD -Respiratory remained status stable.  Echo without evidence of RV  dysfunction or severe PH.  He remained on room air.   History of seizures -Continued levetiracetam 500 mg p.o. twice daily.   Chronic hypotension -Continued midodrine 2.5 mg p.o. twice daily.    Ambulatory dysfunction: PT/OT recommended home health PT however patient refused at the time of discharge.  GERD: Continued PPI  Chronic thrombocytopenia: Platelet count improved.  Discharge Diagnoses:   Acute calculus cholecystitis Obstructing choledocholithiasis History of PE History of ischemic left MCA stroke with residual right-sided weakness and aphasia Chronic ambulatory dysfunction Hyperlipidemia COPD History of seizures Chronic hypertension History of squamous cell lung cancer PAD GERD Chronic thrombocytopenia:   Discharge Instructions   Allergies as of 07/09/2023   No Known Allergies      Medication List     STOP taking these medications    ondansetron 4 MG disintegrating tablet Commonly known as: ZOFRAN-ODT       TAKE these medications    albuterol (2.5 MG/3ML) 0.083% nebulizer solution Commonly known as: PROVENTIL Take 3 mLs (2.5 mg total) by nebulization every 4 (four) hours as needed for wheezing or shortness of breath.   atorvastatin 40 MG tablet Commonly known as: Lipitor Take 1 tablet (40 mg total) by mouth daily.   budesonide 0.5 MG/2ML nebulizer solution Commonly known as: PULMICORT NEW PRESCRIPTION REQEUST: BUDESONIDE 0.5 MG/ - USE ONE VIAL TWICE DAILY What changed:  how much to take how to take this when to take this additional instructions   Eliquis 5 MG Tabs tablet Generic drug: apixaban TAKE 1 TABLET(5 MG) BY MOUTH TWICE DAILY What changed: See the new instructions.   folic acid 1 MG tablet Commonly known as: FOLVITE Take 1 tablet (1 mg total) by mouth daily.   levETIRAcetam 500 MG tablet Commonly known as: KEPPRA Take 1 tablet (500 mg total) by  mouth 2 (two) times daily.   midodrine 2.5 MG tablet Commonly known as:  PROAMATINE Take 1 tablet (2.5 mg total) by mouth 2 (two) times daily with a meal. What changed: when to take this   multivitamin with minerals Tabs tablet Take 1 tablet by mouth daily. What changed: when to take this   oxyCODONE-acetaminophen 5-325 MG tablet Commonly known as: PERCOCET/ROXICET Take 1 tablet by mouth every 8 (eight) hours as needed for up to 7 days for severe pain. What changed: when to take this   pantoprazole 40 MG tablet Commonly known as: PROTONIX Take 1 tablet (40 mg total) by mouth daily. Start taking on: July 10, 2023   Stiolto Respimat 2.5-2.5 MCG/ACT Aers Generic drug: Tiotropium Bromide-Olodaterol NEW PRESCRIPTION REQUEST: STIOLTO 2.5 MCG- INHALE TWO PUFFS BY MOUTH DAILY What changed:  how much to take how to take this when to take this additional instructions   sucralfate 1 GM/10ML suspension Commonly known as: Carafate Take 10 mLs (1 g total) by mouth 4 (four) times daily -  with meals and at bedtime.        Follow-up Information     Dyann Kief, PA-C Follow up.   Specialty: Cardiology Why: Follow-up with Cardiology scheduled for 09/04/2023 at 11:45am. Please arrive 15 minutes early for check-in. If this date/ time does not work for you, please call our office to reschedule. Contact information: [address] [place] [address] [phone]         Karie Soda, MD. Schedule an appointment as soon as possible for a visit on 07/14/2023.   Specialties: General Surgery, Colon and Rectal Surgery Contact information: [address] [place] [address] [phone]                No Known Allergies  Consultations: General surgery GI   Procedures/Studies: DG ERCP  Result Date: 07/06/2023 CLINICAL DATA:  [id number] Surgery, elective [id number] EXAM: ERCP TECHNIQUE: Multiple spot images obtained with the fluoroscopic device and submitted for interpretation post-procedure. COMPARISON:  Intraop  cholangiogram from previous day FINDINGS: A series of fluoroscopic spot images document endoscopic cannulation and opacification of the CBD, with passage of balloon tipped catheter. No extravasation. Incomplete opacification of the intrahepatic biliary tree which appears mildly distended centrally. IMPRESSION: Endoscopic CBD cannulation and intervention as above. Electronically Signed   By: Corlis Leak M.D.   On: 07/06/2023 16:23   DG Cholangiogram Operative  Result Date: 07/06/2023 CLINICAL DATA:  Intraoperative cholangiogram for elective cholecystectomy EXAM: INTRAOPERATIVE CHOLANGIOGRAM TECHNIQUE: Cholangiographic images from the C-arm fluoroscopic device were submitted for interpretation post-operatively. Please see the procedural report for the amount of contrast and the fluoroscopy time utilized. FLUOROSCOPY: Radiation Exposure Index (as provided by the fluoroscopic device): 6.91 mGy Kerma COMPARISON:  Abdominal ultrasound 07/04/2023 FINDINGS: 2 cine clips are submitted for review. The images demonstrate cannulation of the cystic duct remanent and opacification of the biliary tree. There is mild biliary ductal dilatation. A small filling defect is present in the distal common bile duct. A trace amount of contrast material trickles around the filling defect in through the ampulla. IMPRESSION: 1. Positive for incompletely obstructing choledocholithiasis. 2. Mild biliary ductal dilatation. Electronically Signed   By: Malachy Moan M.D.   On: 07/06/2023 08:18   ECHOCARDIOGRAM COMPLETE  Result Date: 07/04/2023    ECHOCARDIOGRAM REPORT   Patient Name:   Todd Estrada Date of Exam: 07/04/2023 Medical Rec #:  [id number]     Height:  72.0 in Accession #:    [id number]    Weight:       165.0 lb Date of Birth:  09-18-1961     BSA:          1.963 m Patient Age:    62 years      BP:           119/73 mmHg Patient Gender: M             HR:           76 bpm. Exam Location:  Inpatient Procedure: 2D Echo, Cardiac  Doppler, Color Doppler and Intracardiac            Opacification Agent Indications:    CHF I50.9  History:        Patient has prior history of Echocardiogram examinations, most                 recent 10/13/2022. CHF, Stroke, PAD and Emphysema; Risk                 Factors:Dyslipidemia and Former Smoker.  Sonographer:    Dondra Prader RVT RCS Referring Phys: [id number] Jadd MANUEL ORTIZ  Sonographer Comments: Technically challenging study due to limited acoustic windows, Technically difficult study due to poor echo windows, suboptimal parasternal window, suboptimal apical window and suboptimal subcostal window. Technically difficult exam  due to very poor and limited windows; attempted Definity. IMPRESSIONS  1. Left ventricular ejection fraction, by estimation, is 60 to 65%. The left ventricle has normal function. Left ventricular endocardial border not optimally defined to evaluate regional wall motion. There is mild left ventricular hypertrophy. Left ventricular diastolic parameters are consistent with Grade I diastolic dysfunction (impaired relaxation).  2. Right ventricular systolic function is mildly reduced. The right ventricular size is normal.  3. The mitral valve is normal in structure. Trivial mitral valve regurgitation. No evidence of mitral stenosis.  4. The aortic valve was not well visualized. Aortic valve regurgitation is not visualized. No aortic stenosis is present.  5. Aortic dilatation noted. There is borderline dilatation of the ascending aorta, measuring 39 mm.  6. The inferior vena cava is normal in size with greater than 50% respiratory variability, suggesting right atrial pressure of 3 mmHg.  7. Technically very limited study with poor images even with Definity contrast. LV function appears normal. Suspect RV function mildly decreased. FINDINGS  Left Ventricle: Left ventricular ejection fraction, by estimation, is 60 to 65%. The left ventricle has normal function. Left ventricular endocardial  border not optimally defined to evaluate regional wall motion. Definity contrast agent was given IV to delineate the left ventricular endocardial borders. The left ventricular internal cavity size was normal in size. There is mild left ventricular hypertrophy. Left ventricular diastolic parameters are consistent with Grade I diastolic dysfunction (impaired relaxation). Right Ventricle: The right ventricular size is normal. No increase in right ventricular wall thickness. Right ventricular systolic function is mildly reduced. Left Atrium: Left atrial size was normal in size. Right Atrium: Right atrial size was normal in size. Pericardium: There is no evidence of pericardial effusion. Mitral Valve: The mitral valve is normal in structure. Trivial mitral valve regurgitation. No evidence of mitral valve stenosis. Tricuspid Valve: The tricuspid valve is not well visualized. Tricuspid valve regurgitation is trivial. No evidence of tricuspid stenosis. Aortic Valve: The aortic valve was not well visualized. Aortic valve regurgitation is not visualized. No aortic stenosis is present. Pulmonic Valve: The pulmonic valve was not well visualized. Pulmonic  valve regurgitation is not visualized. No evidence of pulmonic stenosis. Aorta: Aortic dilatation noted. There is borderline dilatation of the ascending aorta, measuring 39 mm. Venous: The inferior vena cava is normal in size with greater than 50% respiratory variability, suggesting right atrial pressure of 3 mmHg. IAS/Shunts: No atrial level shunt detected by color flow Doppler. Additional Comments: A venous catheter is visualized.  LEFT VENTRICLE PLAX 2D LVIDd:         4.80 cm   Diastology LVIDs:         3.90 cm   LV e' medial:    8.16 cm/s LV PW:         1.10 cm   LV E/e' medial:  7.1 LV IVS:        1.10 cm   LV e' lateral:   10.00 cm/s LVOT diam:     2.20 cm   LV E/e' lateral: 5.8 LV SV:         63 LV SV Index:   32 LVOT Area:     3.80 cm  RIGHT VENTRICLE             IVC  RV S prime:     12.90 cm/s  IVC diam: 1.20 cm TAPSE (M-mode): 2.8 cm LEFT ATRIUM         Index LA diam:    3.60 cm 1.83 cm/m  AORTIC VALVE             PULMONIC VALVE LVOT Vmax:   84.20 cm/s  PV Vmax:       0.90 m/s LVOT Vmean:  61.900 cm/s PV Peak grad:  3.3 mmHg LVOT VTI:    0.165 m  AORTA Ao Root diam: 3.50 cm Ao Asc diam:  3.90 cm MITRAL VALVE MV Area (PHT): 2.54 cm    SHUNTS MV Decel Time: 299 msec    Systemic VTI:  0.16 m MV E velocity: 57.80 cm/s  Systemic Diam: 2.20 cm MV A velocity: 75.80 cm/s MV E/A ratio:  0.76 Arvilla Meres MD Electronically signed by Arvilla Meres MD Signature Date/Time: 07/04/2023/4:21:28 PM    Final    US Abdomen Limited RUQ (LIVER/GB)  Result Date: 07/04/2023 CLINICAL DATA:  Right upper quadrant pain. EXAM: ULTRASOUND ABDOMEN LIMITED RIGHT UPPER QUADRANT COMPARISON:  None Available. FINDINGS: Gallbladder: Multiple gallstones evident measuring up to 11 mm. Gallbladder wall thickness varies from 3-5 mm thickness. Small volume pericholecystic fluid evident. Common bile duct: Diameter: Not well seen. Liver: Coarsening of hepatic echotexture evident without a discrete or focal mass lesion evident. Suspicion for trace intrahepatic biliary duct dilatation. Portal vein is patent on color Doppler imaging with normal direction of blood flow towards the liver. Other: None. IMPRESSION: 1. Cholelithiasis with gallbladder wall thickening and small volume pericholecystic fluid. Imaging features are concerning for acute cholecystitis. 2. Suspicion for trace intrahepatic biliary duct dilatation. Common bile duct not well seen. Electronically Signed   By: Kennith Center M.D.   On: 07/04/2023 05:40   CT ABDOMEN PELVIS W CONTRAST  Result Date: 07/04/2023 CLINICAL DATA:  Bowel obstruction suspected. Ongoing vomiting. History of lung cancer. EXAM: CT ABDOMEN AND PELVIS WITH CONTRAST TECHNIQUE: Multidetector CT imaging of the abdomen and pelvis was performed using the standard protocol  following bolus administration of intravenous contrast. RADIATION DOSE REDUCTION: This exam was performed according to the departmental dose-optimization program which includes automated exposure control, adjustment of the mA and/or kV according to patient size and/or use of iterative reconstruction technique. CONTRAST:  OMNIPAQUE  IOHEXOL 300 MG/ML  SOLN COMPARISON:  04/08/2023 FINDINGS: Lower chest: Reticulation in the left lower lobe. No acute finding. Hepatobiliary: No focal liver abnormality.Edematous thickening of the gallbladder which is distended. The CBD measures up to 9 mm in diameter. No calcified gallstone. Pancreas: Generalized atrophy. Spleen: Unremarkable. Adrenals/Urinary Tract: Negative adrenals. No hydronephrosis or stone. Unremarkable bladder. Stomach/Bowel:  No obstruction. No appendicitis. Vascular/Lymphatic: No acute vascular abnormality. Severe atheromatous plaque affecting the aorta and branch vessels with bilateral chronic SFA and left common iliac artery occlusions. Numerous bilateral inflow stenoses. No mass or adenopathy. Reproductive:No pathologic findings. Other: No ascites or pneumoperitoneum. Musculoskeletal: No acute abnormalities. IMPRESSION: 1. Gallbladder distension and edema as with acute cholecystitis. No calcified gallstones, suggest ultrasound correlation. 2. Severe atherosclerosis with chronic occlusions of the left common iliac and bilateral SFA. Electronically Signed   By: Tiburcio Pea M.D.   On: 07/04/2023 04:04   CT Head Wo Contrast  Result Date: 07/04/2023 CLINICAL DATA:  Head trauma EXAM: CT HEAD WITHOUT CONTRAST CT CERVICAL SPINE WITHOUT CONTRAST TECHNIQUE: Multidetector CT imaging of the head and cervical spine was performed following the standard protocol without intravenous contrast. Multiplanar CT image reconstructions of the cervical spine were also generated. RADIATION DOSE REDUCTION: This exam was performed according to the departmental  dose-optimization program which includes automated exposure control, adjustment of the mA and/or kV according to patient size and/or use of iterative reconstruction technique. 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 Ex vacuo dilatation of the left lateral ventricle. There is hypoattenuation of the periventricular white matter, most commonly indicating chronic ischemic microangiopathy. Vascular: No abnormal hyperdensity of the major intracranial arteries or dural venous sinuses. No intracranial atherosclerosis. Skull: The visualized skull base, calvarium and extracranial soft tissues are normal. Sinuses/Orbits: No fluid levels or advanced mucosal thickening of the visualized paranasal sinuses. No mastoid or middle ear effusion. The orbits are normal. CT CERVICAL SPINE FINDINGS Alignment: No static subluxation. Facets are aligned. Occipital condyles are normally positioned. Skull base and vertebrae: No acute fracture. Soft tissues and spinal canal: No prevertebral fluid or swelling. No visible canal hematoma. Disc levels: No advanced spinal canal or neural foraminal stenosis. Upper chest: No pneumothorax, pulmonary nodule or pleural effusion. Other: Normal visualized paraspinal cervical soft tissues. IMPRESSION: 1. No acute fracture or static subluxation of the cervical spine. 2. 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 Electronically Signed   By: Deatra Robinson M.D.   On: 07/04/2023 03:25   CT Cervical Spine Wo Contrast  Result Date: 07/04/2023 CLINICAL DATA:  Head trauma EXAM: CT HEAD WITHOUT CONTRAST CT CERVICAL SPINE WITHOUT CONTRAST TECHNIQUE: Multidetector CT imaging of the head and cervical spine was performed following the standard protocol without intravenous contrast. Multiplanar CT image reconstructions of the cervical spine were also generated. RADIATION DOSE REDUCTION:  This exam was performed according to the departmental dose-optimization program which includes automated exposure control, adjustment of the mA and/or kV according to patient size and/or use of iterative reconstruction technique. 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 Ex vacuo dilatation of the left lateral ventricle. There is hypoattenuation of the periventricular white matter, most commonly indicating chronic ischemic microangiopathy. Vascular: No abnormal hyperdensity of the major intracranial arteries or dural venous sinuses. No intracranial atherosclerosis. Skull: The visualized skull base, calvarium and extracranial soft tissues are normal. Sinuses/Orbits:  No fluid levels or advanced mucosal thickening of the visualized paranasal sinuses. No mastoid or middle ear effusion. The orbits are normal. CT CERVICAL SPINE FINDINGS Alignment: No static subluxation. Facets are aligned. Occipital condyles are normally positioned. Skull base and vertebrae: No acute fracture. Soft tissues and spinal canal: No prevertebral fluid or swelling. No visible canal hematoma. Disc levels: No advanced spinal canal or neural foraminal stenosis. Upper chest: No pneumothorax, pulmonary nodule or pleural effusion. Other: Normal visualized paraspinal cervical soft tissues. IMPRESSION: 1. No acute fracture or static subluxation of the cervical spine. 2. 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 Electronically Signed   By: Deatra Robinson M.D.   On: 07/04/2023 03:25   DG Chest Port 1 View  Result Date: 07/04/2023 CLINICAL DATA:  Fever, cancer patient, vomiting EXAM: PORTABLE CHEST 1 VIEW COMPARISON:  Radiographs 06/11/2023 FINDINGS: No change from 06/11/2023. Unchanged dense bandlike consolidation in the peripheral right mid lung with biopsy clip. Bandlike fibrotic change in the mid left lung. No new focal  consolidation, pleural effusion, or pneumothorax. No displaced rib fractures. Stable cardiomediastinal silhouette. Aortic atherosclerotic calcification. IMPRESSION: Unchanged consolidation/scarring in the bilateral mid lungs. Electronically Signed   By: Minerva Fester M.D.   On: 07/04/2023 01:25   DG Ribs Unilateral W/Chest Right  Result Date: 06/11/2023 CLINICAL DATA:  Fall onto rib, pain EXAM: RIGHT RIBS AND CHEST - 3+ VIEW COMPARISON:  06/03/2023 FINDINGS: No displaced fracture or other bone lesions are seen involving the ribs. There is no evidence of pneumothorax or pleural effusion. Unchanged dense, bandlike consolidation of the peripheral right midlung with internal biopsy marking clip. Additional, less dense bandlike fibrotic change of the left midlung. No new airspace opacity. Heart size and mediastinal contours are within normal limits. IMPRESSION: 1. No displaced fracture or radiographic abnormality of the ribs to explain pain. 2. Unchanged dense, bandlike consolidation of the peripheral right midlung with internal biopsy marking clip. Additional, less dense bandlike fibrotic change of the left midlung. No new airspace opacity. Electronically Signed   By: Jearld Lesch M.D.   On: 06/11/2023 17:01      Subjective: Patient seen and examined.  Resting comfortably on the bed.  Discharge Exam: Vitals:   07/09/23 0622 07/09/23 0915  BP:  103/74  Pulse:    Resp:    Temp:    SpO2: 94%    Vitals:   07/08/23 1816 07/09/23 0446 07/09/23 0622 07/09/23 0915  BP:  106/71  103/74  Pulse:  82    Resp:  14    Temp:  98.8 F (37.1 C)    TempSrc:  Oral    SpO2: 98% 95% 94%   Weight:      Height:        General: Pt is alert, awake, not in acute distress, aphasic, lying comfortably on the bed Cardiovascular: RRR, S1/S2 +, no rubs, no gallops Respiratory: CTA bilaterally, no wheezing, no rhonchi Abdominal: Abdomen soft, some tenderness noted around JP drain site.  No guarding, no rigidity.   JP drain noted with some nonbilious sanguineous fluid.   Extremities: no edema, no cyanosis    The results of significant diagnostics from this hospitalization (including imaging, microbiology, ancillary and laboratory) are listed below for reference.     Microbiology: Recent Results (from the past 240 hour(s))  Blood culture (routine x 2)     Status: None   Collection Time: 07/04/23  1:58 AM   Specimen: BLOOD  Result Value Ref Range Status  Specimen Description   Final    BLOOD BLOOD LEFT ARM Performed at [facility], [address] [place], [place] [address]    Special Requests   Final    BOTTLES DRAWN AEROBIC AND ANAEROBIC Blood Culture adequate volume Performed at [facility], [address] [place], [place] [address]    Culture   Final    NO GROWTH 5 DAYS Performed at [facility], [address] [place], [place] [address]    Report Status 07/09/2023 FINAL  Final  Blood culture (routine x 2)     Status: None   Collection Time: 07/04/23  1:58 AM   Specimen: BLOOD  Result Value Ref Range Status   Specimen Description   Final    BLOOD BLOOD RIGHT ARM Performed at [facility], [address], [place], [place] [address]    Special Requests   Final    BOTTLES DRAWN AEROBIC AND ANAEROBIC Blood Culture results may not be optimal due to an excessive volume of blood received in culture bottles Performed at [facility], [address] [place], [place] [address]    Culture   Final    NO GROWTH 5 DAYS Performed at [facility], [address], [place], [place] [address]    Report Status 07/09/2023 FINAL  Final  Resp panel by RT-PCR (RSV, Flu A&B, Covid) Peripheral     Status: None   Collection Time: 07/04/23  1:58 AM   Specimen: Peripheral; Nasal Swab  Result Value Ref Range Status   SARS Coronavirus 2 by RT PCR NEGATIVE NEGATIVE Final    Comment: (NOTE) SARS-CoV-2 target nucleic  acids are NOT DETECTED.  The SARS-CoV-2 RNA is generally detectable in upper respiratory specimens during the acute phase of infection. The lowest concentration of SARS-CoV-2 viral copies this assay can detect is 138 copies/mL. A negative result does not preclude SARS-Cov-2 infection and should not be used as the sole basis for treatment or other patient management decisions. A negative result may occur with  improper specimen collection/handling, submission of specimen other than nasopharyngeal swab, presence of viral mutation(s) within the areas targeted by this assay, and inadequate number of viral copies(<138 copies/mL). A negative result must be combined with clinical observations, patient history, and epidemiological information. The expected result is Negative.  Fact Sheet for Patients:  BloggerCourse.com  Fact Sheet for Healthcare Providers:  SeriousBroker.it  This test is no t yet approved or cleared by the [place] FDA and  has been authorized for detection and/or diagnosis of SARS-CoV-2 by FDA under an Emergency Use Authorization (EUA). This EUA will remain  in effect (meaning this test can be used) for the duration of the COVID-19 declaration under Section 564(b)(1) of the Act, 21 U.S.C.section 360bbb-3(b)(1), unless the authorization is terminated  or revoked sooner.       Influenza A by PCR NEGATIVE NEGATIVE Final   Influenza B by PCR NEGATIVE NEGATIVE Final    Comment: (NOTE) The Xpert Xpress SARS-CoV-2/FLU/RSV plus assay is intended as an aid in the diagnosis of influenza from Nasopharyngeal swab specimens and should not be used as a sole basis for treatment. Nasal washings and aspirates are unacceptable for Xpert Xpress SARS-CoV-2/FLU/RSV testing.  Fact Sheet for Patients: BloggerCourse.com  Fact Sheet for Healthcare Providers: SeriousBroker.it  This  test is not yet approved or cleared by the [place] FDA and has been authorized for detection and/or diagnosis of SARS-CoV-2 by FDA under  an Emergency Use Authorization (EUA). This EUA will remain in effect (meaning this test can be used) for the duration of the COVID-19 declaration under Section 564(b)(1) of the Act, 21 U.S.C. section 360bbb-3(b)(1), unless the authorization is terminated or revoked.     Resp Syncytial Virus by PCR NEGATIVE NEGATIVE Final    Comment: (NOTE) Fact Sheet for Patients: BloggerCourse.com  Fact Sheet for Healthcare Providers: SeriousBroker.it  This test is not yet approved or cleared by the [place] FDA and has been authorized for detection and/or diagnosis of SARS-CoV-2 by FDA under an Emergency Use Authorization (EUA). This EUA will remain in effect (meaning this test can be used) for the duration of the COVID-19 declaration under Section 564(b)(1) of the Act, 21 U.S.C. section 360bbb-3(b)(1), unless the authorization is terminated or revoked.  Performed at [facility], [address] [place], [place] [address]   Surgical pcr screen     Status: None   Collection Time: 07/04/23  9:37 PM   Specimen: Nasal Mucosa; Nasal Swab  Result Value Ref Range Status   MRSA, PCR NEGATIVE NEGATIVE Final   Staphylococcus aureus NEGATIVE NEGATIVE Final    Comment: (NOTE) The Xpert SA Assay (FDA approved for NASAL specimens in patients 77 years of age and older), is one component of a comprehensive surveillance program. It is not intended to diagnose infection nor to guide or monitor treatment. Performed at [facility], [address], [place], [place] [address]      Labs: BNP (last 3 results) Recent Labs    10/12/22 0955 04/08/23 1135 05/16/23 2013  BNP 16.2 24.1 28.8   Basic Metabolic Panel: Recent Labs  Lab 07/04/23 0850 07/05/23 0511  07/06/23 0550 07/07/23 0537 07/08/23 0513 07/09/23 0525  NA  --  134* 138 136 138 136  K  --  3.6 3.9 4.1 3.8 4.0  CL  --  103 100 102 104 100  CO2  --  24 29 27 27 28   GLUCOSE  --  108* 153* 131* 101* 99  BUN  --  9 12 20 19 16   CREATININE  --  0.72 0.82 0.87 0.81 0.93  CALCIUM  --  8.0* 8.6* 8.1* 8.2* 8.6*  MG 1.8  --   --   --   --   --   PHOS 3.1  --   --   --   --   --    Liver Function Tests: Recent Labs  Lab 07/05/23 0511 07/06/23 0550 07/07/23 0537 07/08/23 0513 07/09/23 0525  AST 20 78* 36 29 28  ALT 19 53* 41 38 35  ALKPHOS 65 81 61 62 68  BILITOT 0.9 0.5 0.4 0.3 0.4  PROT 6.2* 6.8 6.3* 6.4* 6.9  ALBUMIN 2.9* 2.9* 2.7* 2.7* 3.0*   Recent Labs  Lab 07/04/23 0158  LIPASE 23   No results for input(s): "AMMONIA" in the last 168 hours. CBC: Recent Labs  Lab 07/04/23 0158 07/05/23 0511 07/06/23 0550 07/07/23 0537 07/08/23 0513 07/09/23 0525  WBC 13.4* 9.2 8.7 8.2 7.7 9.6  NEUTROABS 11.3*  --   --   --   --   --   HGB 14.1 11.6* 12.6* 10.7* 11.7* 13.2  HCT 44.3 37.1* 39.7 34.1* 38.4* 40.9  MCV 93.7 93.0 92.1 93.4 95.8 93.4  PLT 147* 140* 148* 151 148* 199   Cardiac Enzymes: No results for input(s): "CKTOTAL", "CKMB", "CKMBINDEX", "TROPONINI" in the last 168 hours. BNP: Invalid input(s): "POCBNP" CBG: No  results for input(s): "GLUCAP" in the last 168 hours. D-Dimer No results for input(s): "DDIMER" in the last 72 hours. Hgb A1c No results for input(s): "HGBA1C" in the last 72 hours. Lipid Profile No results for input(s): "CHOL", "HDL", "LDLCALC", "TRIG", "CHOLHDL", "LDLDIRECT" in the last 72 hours. Thyroid function studies No results for input(s): "TSH", "T4TOTAL", "T3FREE", "THYROIDAB" in the last 72 hours.  Invalid input(s): "FREET3" Anemia work up No results for input(s): "VITAMINB12", "FOLATE", "FERRITIN", "TIBC", "IRON", "RETICCTPCT" in the last 72 hours. Urinalysis    Component Value Date/Time   COLORURINE YELLOW 07/04/2023 0735    APPEARANCEUR CLEAR 07/04/2023 0735   LABSPEC >1.046 (H) 07/04/2023 0735   PHURINE 6.0 07/04/2023 0735   GLUCOSEU NEGATIVE 07/04/2023 0735   HGBUR NEGATIVE 07/04/2023 0735   BILIRUBINUR NEGATIVE 07/04/2023 0735   KETONESUR NEGATIVE 07/04/2023 0735   PROTEINUR 30 (A) 07/04/2023 0735   NITRITE NEGATIVE 07/04/2023 0735   LEUKOCYTESUR NEGATIVE 07/04/2023 0735   Sepsis Labs Recent Labs  Lab 07/06/23 0550 07/07/23 0537 07/08/23 0513 07/09/23 0525  WBC 8.7 8.2 7.7 9.6   Microbiology Recent Results (from the past 240 hour(s))  Blood culture (routine x 2)     Status: None   Collection Time: 07/04/23  1:58 AM   Specimen: BLOOD  Result Value Ref Range Status   Specimen Description   Final    BLOOD BLOOD LEFT ARM Performed at [facility], [address] [place], [place] [address]    Special Requests   Final    BOTTLES DRAWN AEROBIC AND ANAEROBIC Blood Culture adequate volume Performed at [facility], [address], [place], [place] [address]    Culture   Final    NO GROWTH 5 DAYS Performed at [facility], [address] [place], [place] [address]    Report Status 07/09/2023 FINAL  Final  Blood culture (routine x 2)     Status: None   Collection Time: 07/04/23  1:58 AM   Specimen: BLOOD  Result Value Ref Range Status   Specimen Description   Final    BLOOD BLOOD RIGHT ARM Performed at [facility], [address], [place], [place] [address]    Special Requests   Final    BOTTLES DRAWN AEROBIC AND ANAEROBIC Blood Culture results may not be optimal due to an excessive volume of blood received in culture bottles Performed at [facility], [address] [place], [place] [address]    Culture   Final    NO GROWTH 5 DAYS Performed at [facility], [address] [place], [place] [address]    Report Status 07/09/2023 FINAL  Final  Resp panel by RT-PCR (RSV, Flu A&B, Covid) Peripheral      Status: None   Collection Time: 07/04/23  1:58 AM   Specimen: Peripheral; Nasal Swab  Result Value Ref Range Status   SARS Coronavirus 2 by RT PCR NEGATIVE NEGATIVE Final    Comment: (NOTE) SARS-CoV-2 target nucleic acids are NOT DETECTED.  The SARS-CoV-2 RNA is generally detectable in upper respiratory specimens during the acute phase of infection. The lowest concentration of SARS-CoV-2 viral copies this assay can detect is 138 copies/mL. A negative result does not preclude SARS-Cov-2 infection and should not be used as the sole basis for treatment or other patient management decisions. A negative result may occur with  improper specimen collection/handling, submission of specimen other than nasopharyngeal swab, presence of viral mutation(s) within the areas  targeted by this assay, and inadequate number of viral copies(<138 copies/mL). A negative result must be combined with clinical observations, patient history, and epidemiological information. The expected result is Negative.  Fact Sheet for Patients:  BloggerCourse.com  Fact Sheet for Healthcare Providers:  SeriousBroker.it  This test is no t yet approved or cleared by the [place] FDA and  has been authorized for detection and/or diagnosis of SARS-CoV-2 by FDA under an Emergency Use Authorization (EUA). This EUA will remain  in effect (meaning this test can be used) for the duration of the COVID-19 declaration under Section 564(b)(1) of the Act, 21 U.S.C.section 360bbb-3(b)(1), unless the authorization is terminated  or revoked sooner.       Influenza A by PCR NEGATIVE NEGATIVE Final   Influenza B by PCR NEGATIVE NEGATIVE Final    Comment: (NOTE) The Xpert Xpress SARS-CoV-2/FLU/RSV plus assay is intended as an aid in the diagnosis of influenza from Nasopharyngeal swab specimens and should not be used as a sole basis for treatment. Nasal washings and aspirates are  unacceptable for Xpert Xpress SARS-CoV-2/FLU/RSV testing.  Fact Sheet for Patients: BloggerCourse.com  Fact Sheet for Healthcare Providers: SeriousBroker.it  This test is not yet approved or cleared by the [place] FDA and has been authorized for detection and/or diagnosis of SARS-CoV-2 by FDA under an Emergency Use Authorization (EUA). This EUA will remain in effect (meaning this test can be used) for the duration of the COVID-19 declaration under Section 564(b)(1) of the Act, 21 U.S.C. section 360bbb-3(b)(1), unless the authorization is terminated or revoked.     Resp Syncytial Virus by PCR NEGATIVE NEGATIVE Final    Comment: (NOTE) Fact Sheet for Patients: BloggerCourse.com  Fact Sheet for Healthcare Providers: SeriousBroker.it  This test is not yet approved or cleared by the [place] FDA and has been authorized for detection and/or diagnosis of SARS-CoV-2 by FDA under an Emergency Use Authorization (EUA). This EUA will remain in effect (meaning this test can be used) for the duration of the COVID-19 declaration under Section 564(b)(1) of the Act, 21 U.S.C. section 360bbb-3(b)(1), unless the authorization is terminated or revoked.  Performed at [facility], [address], [place], [place] [address]   Surgical pcr screen     Status: None   Collection Time: 07/04/23  9:37 PM   Specimen: Nasal Mucosa; Nasal Swab  Result Value Ref Range Status   MRSA, PCR NEGATIVE NEGATIVE Final   Staphylococcus aureus NEGATIVE NEGATIVE Final    Comment: (NOTE) The Xpert SA Assay (FDA approved for NASAL specimens in patients 21 years of age and older), is one component of a comprehensive surveillance program. It is not intended to diagnose infection nor to guide or monitor treatment. Performed at [facility], [address] [place], [place] [address]      Time coordinating discharge: Over 30 minutes  SIGNED:   Ollen Bowl, MD  [facility] 07/09/2023, 12:07 PM Pager   If 7PM-7AM, please contact night-coverage [email or web address]

## 2023-07-09 NOTE — TOC Transition Note (Signed)
Transition of Care [facility] - CM/SW Discharge Note   Patient Details  Name: Todd Estrada MRN: [id number] Date of Birth: 1961/03/13  Transition of [facility]) CM/SW Contact:  Adrian Prows, RN Phone Number: 07/09/2023, 10:28 AM   Clinical Narrative:    Orders placed for HHPT; spoke w/ pt in room; pt declined service; Dr Tobie Lords notified; no TOC needs.   Final next level of care: Home/Self Care Barriers to Discharge: No Barriers Identified   Patient Goals and CMS Choice CMS Medicare.gov Compare Post Acute Care list provided to:: Patient Choice offered to / list presented to : Patient  Discharge Placement                         Discharge Plan and Services Additional resources added to the After Visit Summary for     Discharge Planning Services: CM Consult                      HH Arranged: Refused [facility]          Social Determinants of Health (SDOH) Interventions SDOH Screenings   Food Insecurity: No Food Insecurity (07/05/2023)  Housing: Low Risk  (07/05/2023)  Transportation Needs: No Transportation Needs (07/05/2023)  Utilities: Not At Risk (07/05/2023)  Depression (PHQ2-9): Low Risk  (06/01/2023)  Social Connections: Socially Isolated (01/06/2023)  Tobacco Use: Medium Risk (07/06/2023)     Readmission Risk Interventions    07/04/2023    3:54 PM 08/30/2022    3:39 PM 08/16/2022    4:28 PM  Readmission Risk Prevention Plan  Transportation Screening Complete Complete Complete  Medication Review Oceanographer) Complete Complete Complete  PCP or Specialist appointment within 3-5 days of discharge Complete Complete --  HRI or Home Care Consult Complete Complete Complete  SW Recovery Care/Counseling Consult Complete Complete Complete  Palliative Care Screening Not Applicable Not Applicable Not Applicable  Skilled Nursing Facility Not Applicable Not Applicable Not Applicable

## 2023-07-09 NOTE — Plan of Care (Signed)
  Problem: Education: Goal: Knowledge of General Education information will improve Description: Including pain rating scale, medication(s)/side effects and non-pharmacologic comfort measures Outcome: Progressing   Problem: Clinical Measurements: Goal: Diagnostic test results will improve Outcome: Progressing   Problem: Skin Integrity: Goal: Risk for impaired skin integrity will decrease Outcome: Progressing

## 2023-07-09 NOTE — Progress Notes (Signed)
3 Days Post-Op   Subjective/Chief Complaint: Resting comfortably. No complaints Drain - 55 cc of non-bilious serosanguinous fluid   Objective: Vital signs in last 24 hours: Temp:  [98.1 F (36.7 C)-98.8 F (37.1 C)] 98.8 F (37.1 C) (07/21 0446) Pulse Rate:  [82-85] 82 (07/21 0446) Resp:  [14-18] 14 (07/21 0446) BP: (106-119)/(71-75) 106/71 (07/21 0446) SpO2:  [94 %-98 %] 94 % (07/21 0622) Last BM Date : 07/07/23  Intake/Output from previous day: 07/20 0701 - 07/21 0700 In: 290 [P.O.:240; IV Piggyback:50] Out: 1655 [Urine:1600; Drains:55] Intake/Output this shift: No intake/output data recorded.  WDWN in NAD Expressive aphasia No jaundice Abd - soft, non-tender, non-distended Incisions c/d/i Drain - output is serosanguinous with no sign of bile  Lab Results:  Recent Labs    07/08/23 0513 07/09/23 0525  WBC 7.7 9.6  HGB 11.7* 13.2  HCT 38.4* 40.9  PLT 148* 199   BMET Recent Labs    07/08/23 0513 07/09/23 0525  NA 138 136  K 3.8 4.0  CL 104 100  CO2 27 28  GLUCOSE 101* 99  BUN 19 16  CREATININE 0.81 0.93  CALCIUM 8.2* 8.6*   PT/INR No results for input(s): "LABPROT", "INR" in the last 72 hours. ABG No results for input(s): "PHART", "HCO3" in the last 72 hours.  Invalid input(s): "PCO2", "PO2"  Studies/Results: No results found.  Anti-infectives: Anti-infectives (From admission, onward)    Start     Dose/Rate Route Frequency Ordered Stop   07/05/23 1830  piperacillin-tazobactam (ZOSYN) IVPB 3.375 g        3.375 g 12.5 mL/hr over 240 Minutes Intravenous Every 8 hours 07/05/23 1823 07/10/23 2159   07/05/23 0600  cefTRIAXone (ROCEPHIN) 2 g in sodium chloride 0.9 % 100 mL IVPB  Status:  Discontinued        2 g 200 mL/hr over 30 Minutes Intravenous Every 24 hours 07/04/23 0719 07/05/23 1823   07/04/23 0800  metroNIDAZOLE (FLAGYL) IVPB 500 mg  Status:  Discontinued        500 mg 100 mL/hr over 60 Minutes Intravenous Every 12 hours 07/04/23 0719  07/05/23 1823   07/04/23 0600  cefTRIAXone (ROCEPHIN) 2 g in sodium chloride 0.9 % 100 mL IVPB        2 g 200 mL/hr over 30 Minutes Intravenous  Once 07/04/23 0551 07/04/23 0710       Assessment/Plan: POD 4s/p laparoscopic cholecystectomy with IOC - 07/05/23 - Dr. Michaell Cowing Acute cholecystitis with choledocholithiasis - s/p ERCP 07/06/23 - mobilize as able from surgery standpoint  - continue drain for now  - ok to resume Eliquis    FEN: Reg diet, IVF per primary  VTE: SCDs ID: [email or web address]   Discharge planning per primary team.  Will go home with drain in place - to be removed in office in 1 week after discharge.  Follow-up with Dr. Michaell Cowing  LOS: 5 days    Todd Estrada 07/09/2023

## 2023-07-11 ENCOUNTER — Telehealth: Payer: Self-pay | Admitting: *Deleted

## 2023-07-11 ENCOUNTER — Emergency Department (HOSPITAL_COMMUNITY)
Admission: EM | Admit: 2023-07-11 | Discharge: 2023-07-12 | Disposition: A | Discharge status: Home / Self Care | Payer: Medicaid Other | Attending: Emergency Medicine | Admitting: Emergency Medicine

## 2023-07-11 DIAGNOSIS — I10 Essential (primary) hypertension: Secondary | ICD-10-CM | POA: Diagnosis not present

## 2023-07-11 DIAGNOSIS — J449 Chronic obstructive pulmonary disease, unspecified: Secondary | ICD-10-CM | POA: Insufficient documentation

## 2023-07-11 DIAGNOSIS — Z7901 Long term (current) use of anticoagulants: Secondary | ICD-10-CM | POA: Diagnosis not present

## 2023-07-11 DIAGNOSIS — Z85118 Personal history of other malignant neoplasm of bronchus and lung: Secondary | ICD-10-CM | POA: Diagnosis not present

## 2023-07-11 DIAGNOSIS — G8918 Other acute postprocedural pain: Secondary | ICD-10-CM | POA: Insufficient documentation

## 2023-07-11 MED ORDER — LACTATED RINGERS IV BOLUS
1000.0000 mL | Freq: Once | INTRAVENOUS | Status: AC
  Administered 2023-07-11: 1000 mL via INTRAVENOUS

## 2023-07-11 NOTE — ED Provider Notes (Signed)
[place] EMERGENCY DEPARTMENT AT [facility] Provider Note   CSN: [id number] Arrival date & time: 07/11/23  2219     History {Add pertinent medical, surgical, social history, OB history to HPI:1} Chief Complaint  Patient presents with   Post-op Problem    Todd Estrada is a 62 y.o. male.  Level 5 caveat for aphasia from previous stroke.  Patient with a history of hypertension, COPD, lung cancer, recent admission for cholecystectomy and choledocholithiasis on July 17 presenting with upper abdominal pain for the past 2 days.  He was discharged in the hospital 2 days ago.  He indicates pain to his right upper quadrant with surrounding erythema at site of JP drain.  Cannot quantify how much drainage he has had.  Does not know if he has had a fever.  Has not had any vomiting or diarrhea.  Denies any chest pain or shortness of breath. Patient does not know what he has been taking at home for pain.  Does not know when his drain was last drained.  Complains of pain to his right upper quadrant with erythema surrounding JP drain site.  Does not think he had a fever  The history is provided by the patient.       Home Medications Prior to Admission medications   Medication Sig Start Date End Date Taking? Authorizing Provider  albuterol (PROVENTIL) (2.5 MG/3ML) 0.083% nebulizer solution Take 3 mLs (2.5 mg total) by nebulization every 4 (four) hours as needed for wheezing or shortness of breath. Patient not taking: Reported on 04/13/2023 08/16/22   Steffanie Rainwater, MD  apixaban (ELIQUIS) 5 MG TABS tablet TAKE 1 TABLET(5 MG) BY MOUTH TWICE DAILY Patient taking differently: Take 5 mg by mouth 2 (two) times daily. 04/11/23   Merrilyn Puma, MD  atorvastatin (LIPITOR) 40 MG tablet Take 1 tablet (40 mg total) by mouth daily. 01/03/23 01/03/24  Merrilyn Puma, MD  budesonide (PULMICORT) 0.5 MG/2ML nebulizer solution NEW PRESCRIPTION REQEUST: BUDESONIDE 0.5 MG/ - USE ONE VIAL TWICE  DAILY Patient taking differently: Take 0.5 mg by nebulization in the morning and at bedtime. 11/29/22   Martina Sinner, MD  folic acid (FOLVITE) 1 MG tablet Take 1 tablet (1 mg total) by mouth daily. Patient not taking: Reported on 06/03/2023 03/21/23   Merrilyn Puma, MD  levETIRAcetam (KEPPRA) 500 MG tablet Take 1 tablet (500 mg total) by mouth 2 (two) times daily. 12/07/22   Ihor Austin, NP  midodrine (PROAMATINE) 2.5 MG tablet Take 1 tablet (2.5 mg total) by mouth 2 (two) times daily with a meal. 07/09/23 08/08/23  Pahwani, Kasandra Knudsen, MD  Multiple Vitamin (MULTIVITAMIN WITH MINERALS) TABS tablet Take 1 tablet by mouth daily. Patient taking differently: Take 1 tablet by mouth in the morning and at bedtime. 09/20/22   Doran Stabler, DO  oxyCODONE-acetaminophen (PERCOCET/ROXICET) 5-325 MG tablet Take 1 tablet by mouth every 8 (eight) hours as needed for up to 7 days for severe pain. 07/09/23 07/16/23  Pahwani, Kasandra Knudsen, MD  pantoprazole (PROTONIX) 40 MG tablet Take 1 tablet (40 mg total) by mouth daily. 07/10/23 08/09/23  Pahwani, Kasandra Knudsen, MD  sucralfate (CARAFATE) 1 GM/10ML suspension Take 10 mLs (1 g total) by mouth 4 (four) times daily -  with meals and at bedtime. Patient not taking: Reported on 07/04/2023 07/02/23   Nira Conn, MD  Tiotropium Bromide-Olodaterol (STIOLTO RESPIMAT) 2.5-2.5 MCG/ACT AERS NEW PRESCRIPTION REQUEST: STIOLTO 2.5 MCG- INHALE TWO PUFFS BY MOUTH DAILY Patient taking differently:  Inhale 2 each into the lungs daily. 11/29/22   Martina Sinner, MD      Allergies    Patient has no known allergies.    Review of Systems   Review of Systems  Constitutional:  Positive for activity change and appetite change. Negative for fever.  HENT:  Negative for congestion and rhinorrhea.   Respiratory:  Negative for cough, chest tightness and shortness of breath.   Gastrointestinal:  Positive for abdominal pain and nausea. Negative for constipation, diarrhea and vomiting.   Genitourinary:  Negative for dysuria and hematuria.  Musculoskeletal:  Negative for back pain.  Skin:  Negative for rash.  Neurological:  Negative for dizziness, weakness and headaches.   all other systems are negative except as noted in the HPI and PMH.    Physical Exam Updated Vital Signs BP 108/73 (BP Location: Left Arm)   Pulse 90   Temp 98.4 F (36.9 C) (Oral)   Resp 18   SpO2 96%  Physical Exam Vitals and nursing note reviewed.  Constitutional:      General: He is not in acute distress.    Appearance: He is well-developed.  HENT:     Head: Normocephalic and atraumatic.     Mouth/Throat:     Pharynx: No oropharyngeal exudate.  Eyes:     Conjunctiva/sclera: Conjunctivae normal.     Pupils: Pupils are equal, round, and reactive to light.  Neck:     Comments: No meningismus. Cardiovascular:     Rate and Rhythm: Normal rate and regular rhythm.     Heart sounds: Normal heart sounds. No murmur heard. Pulmonary:     Effort: Pulmonary effort is normal. No respiratory distress.     Breath sounds: Normal breath sounds.  Abdominal:     Palpations: Abdomen is soft.     Tenderness: There is abdominal tenderness. There is no guarding or rebound.     Comments: Tender right upper quadrant with JP drain intact.  Minimal surrounding erythema near drain  Musculoskeletal:        General: No tenderness. Normal range of motion.     Cervical back: Normal range of motion and neck supple.  Skin:    General: Skin is warm.  Neurological:     Mental Status: He is alert.     Cranial Nerves: No cranial nerve deficit.     Motor: No abnormal muscle tone.     Coordination: Coordination normal.     Comments: Aphasic from previous stroke, only answers yes and no questions.  Psychiatric:        Behavior: Behavior normal.     ED Results / Procedures / Treatments   Labs (all labs ordered are listed, but only abnormal results are displayed) Labs Reviewed  CULTURE, BLOOD (ROUTINE X 2)   CULTURE, BLOOD (ROUTINE X 2)  CBC WITH DIFFERENTIAL/PLATELET  COMPREHENSIVE METABOLIC PANEL  LIPASE, BLOOD  URINALYSIS, ROUTINE W REFLEX MICROSCOPIC  I-STAT CG4 LACTIC ACID, ED  TROPONIN I (HIGH SENSITIVITY)    EKG None  Radiology No results found.  Procedures Procedures  {Document cardiac monitor, telemetry assessment procedure when appropriate:1}  Medications Ordered in ED Medications  lactated ringers bolus 1,000 mL (has no administration in time range)    ED Course/ Medical Decision Making/ A&P   {   Click here for ABCD2, HEART and other calculatorsREFRESH Note before signing :1}  Medical Decision Making Amount and/or Complexity of Data Reviewed Labs: ordered. Decision-making details documented in ED Course. Radiology: ordered and independent interpretation performed. Decision-making details documented in ED Course. ECG/medicine tests: ordered and independent interpretation performed. Decision-making details documented in ED Course.   ***  {Document critical care time when appropriate:1} {Document review of labs and clinical decision tools ie heart score, Chads2Vasc2 etc:1}  {Document your independent review of radiology images, and any outside records:1} {Document your discussion with family members, caretakers, and with consultants:1} {Document social determinants of health affecting pt's care:1} {Document your decision making why or why not admission, treatments were needed:1} Final Clinical Impression(s) / ED Diagnoses Final diagnoses:  None    Rx / DC Orders ED Discharge Orders     None

## 2023-07-11 NOTE — ED Triage Notes (Signed)
Pt arrived via ems for post op problems/abdominal pain. Pt had gallbladder removed. Pt has hx of cancer. Due to a past CVA pt can only answer yes or no.

## 2023-07-11 NOTE — Telephone Encounter (Signed)
Called patient to inform of Pet Scan for 07-27-23- arrival time- 10:20 am @ [facility], patient to have water only-6 hrs. prior to scan, spoke with patient's cousin Fayrene Fearing and he is aware of this scan and the instructions

## 2023-07-12 ENCOUNTER — Emergency Department (HOSPITAL_COMMUNITY): Payer: Medicaid Other

## 2023-07-12 ENCOUNTER — Telehealth: Payer: Self-pay | Admitting: Radiation Oncology

## 2023-07-12 ENCOUNTER — Encounter (HOSPITAL_COMMUNITY): Payer: Self-pay

## 2023-07-12 LAB — CBC WITH DIFFERENTIAL/PLATELET
Abs Immature Granulocytes: 0.04 10*3/uL (ref 0.00–0.07)
Basophils Absolute: 0 10*3/uL (ref 0.0–0.1)
Basophils Relative: 0 %
Eosinophils Absolute: 0.4 10*3/uL (ref 0.0–0.5)
Eosinophils Relative: 4 %
HCT: 37.5 % — ABNORMAL LOW (ref 39.0–52.0)
Hemoglobin: 11.9 g/dL — ABNORMAL LOW (ref 13.0–17.0)
Immature Granulocytes: 0 %
Lymphocytes Relative: 11 %
Lymphs Abs: 1.1 10*3/uL (ref 0.7–4.0)
MCH: 29.2 pg (ref 26.0–34.0)
MCHC: 31.7 g/dL (ref 30.0–36.0)
MCV: 92.1 fL (ref 80.0–100.0)
Monocytes Absolute: 1 10*3/uL (ref 0.1–1.0)
Monocytes Relative: 9 %
Neutro Abs: 8.3 10*3/uL — ABNORMAL HIGH (ref 1.7–7.7)
Neutrophils Relative %: 76 %
Platelets: 320 10*3/uL (ref 150–400)
RBC: 4.07 MIL/uL — ABNORMAL LOW (ref 4.22–5.81)
RDW: 13.2 % (ref 11.5–15.5)
WBC: 10.9 10*3/uL — ABNORMAL HIGH (ref 4.0–10.5)
nRBC: 0 % (ref 0.0–0.2)

## 2023-07-12 LAB — COMPREHENSIVE METABOLIC PANEL
ALT: 34 U/L (ref 0–44)
AST: 22 U/L (ref 15–41)
Albumin: 3.2 g/dL — ABNORMAL LOW (ref 3.5–5.0)
Alkaline Phosphatase: 77 U/L (ref 38–126)
Anion gap: 7 (ref 5–15)
BUN: 24 mg/dL — ABNORMAL HIGH (ref 8–23)
CO2: 25 mmol/L (ref 22–32)
Calcium: 8.4 mg/dL — ABNORMAL LOW (ref 8.9–10.3)
Chloride: 101 mmol/L (ref 98–111)
Creatinine, Ser: 0.84 mg/dL (ref 0.61–1.24)
GFR, Estimated: >60 mL/min (ref >60)
Glucose, Bld: 107 mg/dL — ABNORMAL HIGH (ref 70–99)
Potassium: 3.9 mmol/L (ref 3.5–5.1)
Sodium: 133 mmol/L — ABNORMAL LOW (ref 135–145)
Total Bilirubin: 0.4 mg/dL (ref 0.3–1.2)
Total Protein: 6.8 g/dL (ref 6.5–8.1)

## 2023-07-12 LAB — URINALYSIS, ROUTINE W REFLEX MICROSCOPIC
Bacteria, UA: NONE SEEN
Bilirubin Urine: NEGATIVE
Glucose, UA: NEGATIVE mg/dL
Hgb urine dipstick: NEGATIVE
Ketones, ur: NEGATIVE mg/dL
Nitrite: NEGATIVE
Protein, ur: NEGATIVE mg/dL
Specific Gravity, Urine: 1.021 (ref 1.005–1.030)
pH: 6 (ref 5.0–8.0)

## 2023-07-12 LAB — I-STAT CG4 LACTIC ACID, ED
Lactic Acid, Venous: 0.7 mmol/L (ref 0.5–1.9)
Lactic Acid, Venous: 1.2 mmol/L (ref 0.5–1.9)

## 2023-07-12 LAB — TROPONIN I (HIGH SENSITIVITY)
Troponin I (High Sensitivity): 4 ng/L (ref <18)
Troponin I (High Sensitivity): 4 ng/L (ref <18)

## 2023-07-12 LAB — LIPASE, BLOOD: Lipase: 29 U/L (ref 11–51)

## 2023-07-12 MED ORDER — IOHEXOL 350 MG/ML SOLN
100.0000 mL | Freq: Once | INTRAVENOUS | Status: AC | PRN
  Administered 2023-07-12: 100 mL via INTRAVENOUS

## 2023-07-12 MED ORDER — SODIUM CHLORIDE (PF) 0.9 % IJ SOLN
INTRAMUSCULAR | Status: AC
  Filled 2023-07-12: qty 50

## 2023-07-12 NOTE — ED Notes (Signed)
Pt was given 3oz of water for PO challenge. Pt passed.

## 2023-07-12 NOTE — Discharge Instructions (Signed)
Your testing today is reassuring.  There are stable changes consistent with your recent surgery.  Continue your medications as prescribed.  You should be seen in the surgery clinic 1 week from your hospital discharge to have this drain removed.  Return to the ED with worsening pain, fever, vomiting or other concerns.

## 2023-07-12 NOTE — Telephone Encounter (Signed)
LVM to schedule FUN after upcoming PET.

## 2023-07-13 LAB — CULTURE, BLOOD (ROUTINE X 2)

## 2023-07-20 ENCOUNTER — Emergency Department (HOSPITAL_COMMUNITY): Payer: Medicaid Other

## 2023-07-20 ENCOUNTER — Other Ambulatory Visit: Payer: Self-pay

## 2023-07-20 ENCOUNTER — Inpatient Hospital Stay (HOSPITAL_COMMUNITY)
Admission: EM | Admit: 2023-07-20 | Discharge: 2023-07-25 | DRG: 871 | Disposition: A | Discharge status: Home / Self Care | Payer: Medicaid Other | Attending: Internal Medicine | Admitting: Internal Medicine

## 2023-07-20 ENCOUNTER — Encounter (HOSPITAL_COMMUNITY): Payer: Self-pay

## 2023-07-20 DIAGNOSIS — G40909 Epilepsy, unspecified, not intractable, without status epilepticus: Secondary | ICD-10-CM | POA: Diagnosis present

## 2023-07-20 DIAGNOSIS — T8579XA Infection and inflammatory reaction due to other internal prosthetic devices, implants and grafts, initial encounter: Secondary | ICD-10-CM | POA: Diagnosis not present

## 2023-07-20 DIAGNOSIS — Z7901 Long term (current) use of anticoagulants: Secondary | ICD-10-CM | POA: Diagnosis not present

## 2023-07-20 DIAGNOSIS — E440 Moderate protein-calorie malnutrition: Secondary | ICD-10-CM | POA: Diagnosis not present

## 2023-07-20 DIAGNOSIS — K219 Gastro-esophageal reflux disease without esophagitis: Secondary | ICD-10-CM | POA: Diagnosis present

## 2023-07-20 DIAGNOSIS — Z1152 Encounter for screening for COVID-19: Secondary | ICD-10-CM | POA: Diagnosis not present

## 2023-07-20 DIAGNOSIS — C3490 Malignant neoplasm of unspecified part of unspecified bronchus or lung: Secondary | ICD-10-CM | POA: Diagnosis not present

## 2023-07-20 DIAGNOSIS — I9589 Other hypotension: Secondary | ICD-10-CM | POA: Diagnosis present

## 2023-07-20 DIAGNOSIS — R6521 Severe sepsis with septic shock: Secondary | ICD-10-CM | POA: Diagnosis not present

## 2023-07-20 DIAGNOSIS — Z87891 Personal history of nicotine dependence: Secondary | ICD-10-CM

## 2023-07-20 DIAGNOSIS — I69351 Hemiplegia and hemiparesis following cerebral infarction affecting right dominant side: Secondary | ICD-10-CM | POA: Diagnosis not present

## 2023-07-20 DIAGNOSIS — Z66 Do not resuscitate: Secondary | ICD-10-CM | POA: Diagnosis present

## 2023-07-20 DIAGNOSIS — Y838 Other surgical procedures as the cause of abnormal reaction of the patient, or of later complication, without mention of misadventure at the time of the procedure: Secondary | ICD-10-CM | POA: Diagnosis present

## 2023-07-20 DIAGNOSIS — E872 Acidosis, unspecified: Secondary | ICD-10-CM | POA: Diagnosis present

## 2023-07-20 DIAGNOSIS — I5032 Chronic diastolic (congestive) heart failure: Secondary | ICD-10-CM | POA: Diagnosis present

## 2023-07-20 DIAGNOSIS — R569 Unspecified convulsions: Secondary | ICD-10-CM | POA: Diagnosis not present

## 2023-07-20 DIAGNOSIS — I6930 Unspecified sequelae of cerebral infarction: Secondary | ICD-10-CM | POA: Diagnosis not present

## 2023-07-20 DIAGNOSIS — K651 Peritoneal abscess: Secondary | ICD-10-CM | POA: Diagnosis not present

## 2023-07-20 DIAGNOSIS — C3411 Malignant neoplasm of upper lobe, right bronchus or lung: Secondary | ICD-10-CM | POA: Diagnosis present

## 2023-07-20 DIAGNOSIS — G9341 Metabolic encephalopathy: Secondary | ICD-10-CM | POA: Diagnosis present

## 2023-07-20 DIAGNOSIS — E785 Hyperlipidemia, unspecified: Secondary | ICD-10-CM | POA: Diagnosis not present

## 2023-07-20 DIAGNOSIS — A419 Sepsis, unspecified organism: Secondary | ICD-10-CM | POA: Diagnosis present

## 2023-07-20 DIAGNOSIS — N179 Acute kidney failure, unspecified: Secondary | ICD-10-CM | POA: Diagnosis present

## 2023-07-20 DIAGNOSIS — Z86711 Personal history of pulmonary embolism: Secondary | ICD-10-CM | POA: Diagnosis not present

## 2023-07-20 DIAGNOSIS — Z923 Personal history of irradiation: Secondary | ICD-10-CM

## 2023-07-20 DIAGNOSIS — Z6822 Body mass index (BMI) 22.0-22.9, adult: Secondary | ICD-10-CM

## 2023-07-20 DIAGNOSIS — I739 Peripheral vascular disease, unspecified: Secondary | ICD-10-CM | POA: Diagnosis present

## 2023-07-20 DIAGNOSIS — I69320 Aphasia following cerebral infarction: Secondary | ICD-10-CM | POA: Diagnosis not present

## 2023-07-20 DIAGNOSIS — J4489 Other specified chronic obstructive pulmonary disease: Secondary | ICD-10-CM | POA: Diagnosis present

## 2023-07-20 DIAGNOSIS — R188 Other ascites: Secondary | ICD-10-CM | POA: Diagnosis present

## 2023-07-20 DIAGNOSIS — Z801 Family history of malignant neoplasm of trachea, bronchus and lung: Secondary | ICD-10-CM

## 2023-07-20 DIAGNOSIS — Z7951 Long term (current) use of inhaled steroids: Secondary | ICD-10-CM

## 2023-07-20 DIAGNOSIS — Z86718 Personal history of other venous thrombosis and embolism: Secondary | ICD-10-CM | POA: Diagnosis not present

## 2023-07-20 DIAGNOSIS — Z79899 Other long term (current) drug therapy: Secondary | ICD-10-CM

## 2023-07-20 DIAGNOSIS — Z993 Dependence on wheelchair: Secondary | ICD-10-CM

## 2023-07-20 DIAGNOSIS — Z9049 Acquired absence of other specified parts of digestive tract: Secondary | ICD-10-CM

## 2023-07-20 DIAGNOSIS — I110 Hypertensive heart disease with heart failure: Secondary | ICD-10-CM | POA: Diagnosis present

## 2023-07-20 LAB — URINALYSIS, W/ REFLEX TO CULTURE (INFECTION SUSPECTED)
Bacteria, UA: NONE SEEN
Bilirubin Urine: NEGATIVE
Glucose, UA: NEGATIVE mg/dL
Hgb urine dipstick: NEGATIVE
Ketones, ur: NEGATIVE mg/dL
Leukocytes,Ua: NEGATIVE
Nitrite: NEGATIVE
Protein, ur: 30 mg/dL — AB
Specific Gravity, Urine: 1.032 — ABNORMAL HIGH (ref 1.005–1.030)
pH: 6 (ref 5.0–8.0)

## 2023-07-20 LAB — CBC WITH DIFFERENTIAL/PLATELET
Abs Immature Granulocytes: 0.27 10*3/uL — ABNORMAL HIGH (ref 0.00–0.07)
Basophils Absolute: 0.1 10*3/uL (ref 0.0–0.1)
Basophils Relative: 0 %
Eosinophils Absolute: 0 10*3/uL (ref 0.0–0.5)
Eosinophils Relative: 0 %
HCT: 45.9 % (ref 39.0–52.0)
Hemoglobin: 14.1 g/dL (ref 13.0–17.0)
Immature Granulocytes: 1 %
Lymphocytes Relative: 3 %
Lymphs Abs: 1.1 10*3/uL (ref 0.7–4.0)
MCH: 29 pg (ref 26.0–34.0)
MCHC: 30.7 g/dL (ref 30.0–36.0)
MCV: 94.3 fL (ref 80.0–100.0)
Monocytes Absolute: 1.5 10*3/uL — ABNORMAL HIGH (ref 0.1–1.0)
Monocytes Relative: 5 %
Neutro Abs: 29.4 10*3/uL — ABNORMAL HIGH (ref 1.7–7.7)
Neutrophils Relative %: 91 %
Platelets: 392 10*3/uL (ref 150–400)
RBC: 4.87 MIL/uL (ref 4.22–5.81)
RDW: 13.4 % (ref 11.5–15.5)
WBC: 32.4 10*3/uL — ABNORMAL HIGH (ref 4.0–10.5)
nRBC: 0 % (ref 0.0–0.2)

## 2023-07-20 LAB — I-STAT CHEM 8, ED
BUN: 21 mg/dL (ref 8–23)
Calcium, Ion: 1.15 mmol/L (ref 1.15–1.40)
Chloride: 100 mmol/L (ref 98–111)
Creatinine, Ser: 1.2 mg/dL (ref 0.61–1.24)
Glucose, Bld: 165 mg/dL — ABNORMAL HIGH (ref 70–99)
HCT: 48 % (ref 39.0–52.0)
Hemoglobin: 16.3 g/dL (ref 13.0–17.0)
Potassium: 4.5 mmol/L (ref 3.5–5.1)
Sodium: 138 mmol/L (ref 135–145)
TCO2: 21 mmol/L — ABNORMAL LOW (ref 22–32)

## 2023-07-20 LAB — COMPREHENSIVE METABOLIC PANEL
ALT: 18 U/L (ref 0–44)
AST: 22 U/L (ref 15–41)
Albumin: 3.8 g/dL (ref 3.5–5.0)
Alkaline Phosphatase: 80 U/L (ref 38–126)
Anion gap: 15 (ref 5–15)
BUN: 22 mg/dL (ref 8–23)
CO2: 23 mmol/L (ref 22–32)
Calcium: 9.5 mg/dL (ref 8.9–10.3)
Chloride: 100 mmol/L (ref 98–111)
Creatinine, Ser: 1.32 mg/dL — ABNORMAL HIGH (ref 0.61–1.24)
GFR, Estimated: >60 mL/min (ref >60)
Glucose, Bld: 168 mg/dL — ABNORMAL HIGH (ref 70–99)
Potassium: 4.5 mmol/L (ref 3.5–5.1)
Sodium: 138 mmol/L (ref 135–145)
Total Bilirubin: 0.9 mg/dL (ref 0.3–1.2)
Total Protein: 8.1 g/dL (ref 6.5–8.1)

## 2023-07-20 LAB — CULTURE, BLOOD (ROUTINE X 2): Special Requests: ADEQUATE

## 2023-07-20 LAB — BLOOD GAS, VENOUS
Acid-Base Excess: 0.1 mmol/L (ref 0.0–2.0)
Bicarbonate: 24.7 mmol/L (ref 20.0–28.0)
O2 Saturation: 81.4 %
Patient temperature: 37
pCO2, Ven: 39 mmHg — ABNORMAL LOW (ref 44–60)
pH, Ven: 7.41 (ref 7.25–7.43)
pO2, Ven: 49 mmHg — ABNORMAL HIGH (ref 32–45)

## 2023-07-20 LAB — IRON AND TIBC
Iron: 20 ug/dL — ABNORMAL LOW (ref 45–182)
Saturation Ratios: 8 % — ABNORMAL LOW (ref 17.9–39.5)
TIBC: 253 ug/dL (ref 250–450)
UIBC: 233 ug/dL

## 2023-07-20 LAB — FERRITIN: Ferritin: 378 ng/mL — ABNORMAL HIGH (ref 24–336)

## 2023-07-20 LAB — RETICULOCYTES
Immature Retic Fract: 14.2 % (ref 2.3–15.9)
RBC.: 3.94 MIL/uL — ABNORMAL LOW (ref 4.22–5.81)
Retic Count, Absolute: 56.3 10*3/uL (ref 19.0–186.0)
Retic Ct Pct: 1.4 % (ref 0.4–3.1)

## 2023-07-20 LAB — PROTIME-INR
INR: 1.4 — ABNORMAL HIGH (ref 0.8–1.2)
Prothrombin Time: 17.4 seconds — ABNORMAL HIGH (ref 11.4–15.2)

## 2023-07-20 LAB — APTT: aPTT: 32 seconds (ref 24–36)

## 2023-07-20 LAB — RESP PANEL BY RT-PCR (RSV, FLU A&B, COVID)  RVPGX2
Influenza A by PCR: NEGATIVE
Influenza B by PCR: NEGATIVE
Resp Syncytial Virus by PCR: NEGATIVE
SARS Coronavirus 2 by RT PCR: NEGATIVE

## 2023-07-20 LAB — I-STAT CG4 LACTIC ACID, ED
Lactic Acid, Venous: 6.2 mmol/L (ref 0.5–1.9)
Lactic Acid, Venous: 5 mmol/L (ref 0.5–1.9)

## 2023-07-20 LAB — LIPASE, BLOOD: Lipase: 28 U/L (ref 11–51)

## 2023-07-20 LAB — TSH: TSH: 3.157 u[IU]/mL (ref 0.350–4.500)

## 2023-07-20 LAB — MAGNESIUM: Magnesium: 1.6 mg/dL — ABNORMAL LOW (ref 1.7–2.4)

## 2023-07-20 LAB — PHOSPHORUS: Phosphorus: 2.8 mg/dL (ref 2.5–4.6)

## 2023-07-20 LAB — CK: Total CK: 22 U/L — ABNORMAL LOW (ref 49–397)

## 2023-07-20 MED ORDER — LACTATED RINGERS IV SOLN: INTRAVENOUS | Status: DC

## 2023-07-20 MED ORDER — VANCOMYCIN HCL IN DEXTROSE 1-5 GM/200ML-% IV SOLN
1000.0000 mg | Freq: Once | INTRAVENOUS | Status: DC
  Filled 2023-07-20: qty 200

## 2023-07-20 MED ORDER — LACTATED RINGERS IV BOLUS (SEPSIS)
1000.0000 mL | Freq: Once | INTRAVENOUS | Status: AC
  Administered 2023-07-20: 1000 mL via INTRAVENOUS

## 2023-07-20 MED ORDER — MAGNESIUM SULFATE 2 GM/50ML IV SOLN
2.0000 g | Freq: Once | INTRAVENOUS | Status: AC
  Administered 2023-07-20: 2 g via INTRAVENOUS
  Filled 2023-07-20: qty 50

## 2023-07-20 MED ORDER — METRONIDAZOLE 500 MG/100ML IV SOLN: 500.0000 mg | Freq: Two times a day (BID) | INTRAVENOUS | Status: DC

## 2023-07-20 MED ORDER — METRONIDAZOLE 500 MG/100ML IV SOLN
500.0000 mg | Freq: Once | INTRAVENOUS | Status: AC
  Administered 2023-07-20: 500 mg via INTRAVENOUS
  Filled 2023-07-20: qty 100

## 2023-07-20 MED ORDER — VANCOMYCIN HCL 1500 MG/300ML IV SOLN
1500.0000 mg | Freq: Once | INTRAVENOUS | Status: AC
  Administered 2023-07-20: 1500 mg via INTRAVENOUS
  Filled 2023-07-20: qty 300

## 2023-07-20 MED ORDER — LEVETIRACETAM IN NACL 1500 MG/100ML IV SOLN
1500.0000 mg | Freq: Once | INTRAVENOUS | Status: AC
  Administered 2023-07-20: 1500 mg via INTRAVENOUS
  Filled 2023-07-20: qty 100

## 2023-07-20 MED ORDER — LACTATED RINGERS IV BOLUS (SEPSIS)
250.0000 mL | Freq: Once | INTRAVENOUS | Status: AC
  Administered 2023-07-20: 250 mL via INTRAVENOUS

## 2023-07-20 MED ORDER — IOHEXOL 300 MG/ML  SOLN
80.0000 mL | Freq: Once | INTRAMUSCULAR | Status: AC | PRN
  Administered 2023-07-20: 80 mL via INTRAVENOUS

## 2023-07-20 MED ORDER — SODIUM CHLORIDE 0.9 % IV SOLN
2.0000 g | Freq: Three times a day (TID) | INTRAVENOUS | Status: DC
  Administered 2023-07-20: 2 g via INTRAVENOUS
  Filled 2023-07-20: qty 12.5

## 2023-07-20 MED ORDER — LEVETIRACETAM IN NACL 500 MG/100ML IV SOLN: 500.0000 mg | Freq: Once | INTRAVENOUS | Status: DC

## 2023-07-20 MED ORDER — SODIUM CHLORIDE 0.9 % IV SOLN
2.0000 g | Freq: Once | INTRAVENOUS | Status: AC
  Administered 2023-07-20: 2 g via INTRAVENOUS
  Filled 2023-07-20: qty 12.5

## 2023-07-20 MED ORDER — PIPERACILLIN-TAZOBACTAM 3.375 G IVPB
3.3750 g | Freq: Three times a day (TID) | INTRAVENOUS | Status: DC
  Administered 2023-07-21 – 2023-07-25 (×14): 3.375 g via INTRAVENOUS
  Filled 2023-07-20 (×14): qty 50

## 2023-07-20 MED ORDER — VANCOMYCIN HCL 1500 MG/300ML IV SOLN
1500.0000 mg | INTRAVENOUS | Status: DC
  Filled 2023-07-20: qty 300

## 2023-07-20 MED ORDER — LEVETIRACETAM IN NACL 500 MG/100ML IV SOLN
500.0000 mg | Freq: Two times a day (BID) | INTRAVENOUS | Status: DC
  Administered 2023-07-20 – 2023-07-25 (×10): 500 mg via INTRAVENOUS
  Filled 2023-07-20 (×11): qty 100

## 2023-07-20 NOTE — ED Notes (Signed)
Patient transported to CT 

## 2023-07-20 NOTE — Progress Notes (Signed)
Pharmacy Note   A consult was received from an ED physician for vancomycin and cefepime per pharmacy dosing.    The patient's profile has been reviewed for ht/wt/allergies/indication/available labs.    A one time order has been placed for vancomycin 1500 mg IV x1 and cefepime 2 gr IV x1 .    Further antibiotics/pharmacy consults should be ordered by admitting physician if indicated.                       Thank you,  Adalberto Cole, PharmD, BCPS 07/20/2023 3:19 PM

## 2023-07-20 NOTE — ED Provider Notes (Signed)
[place] EMERGENCY DEPARTMENT AT [facility] Provider Note   CSN: [id number] Arrival date & time: 07/20/23  1442     History  Chief Complaint  Patient presents with   Code Sepsis    Todd Estrada is a 62 y.o. male.  Patient is a 62 year old male who presents with reported weakness and fever.  He has a history of aphasia from a prior stroke so history is limited.  No family is at bedside.  On chart review, he has a history of hypertension, COPD, lung cancer.  He had a cholecystectomy on July 17 and he currently has a JP drain in place.  He was seen in the ED on July 23 for abdominal pain and was noted to have a fluid collection around the area of the JP drain.  It was discussed with surgery and he was discharged home with outpatient follow-up.  Per report, he has been having some progressive weakness over the last couple days and noted to be having a recent fever.  Of note he is on Eliquis.  He can answer some simple yes or no questions at times.  He indicates he has abdominal pain.       Home Medications Prior to Admission medications   Medication Sig Start Date End Date Taking? Authorizing Provider  albuterol (PROVENTIL) (2.5 MG/3ML) 0.083% nebulizer solution Take 3 mLs (2.5 mg total) by nebulization every 4 (four) hours as needed for wheezing or shortness of breath. Patient not taking: Reported on 04/13/2023 08/16/22   Steffanie Rainwater, MD  apixaban (ELIQUIS) 5 MG TABS tablet TAKE 1 TABLET(5 MG) BY MOUTH TWICE DAILY Patient taking differently: Take 5 mg by mouth 2 (two) times daily. 04/11/23   Merrilyn Puma, MD  atorvastatin (LIPITOR) 40 MG tablet Take 1 tablet (40 mg total) by mouth daily. 01/03/23 01/03/24  Merrilyn Puma, MD  budesonide (PULMICORT) 0.5 MG/2ML nebulizer solution NEW PRESCRIPTION REQEUST: BUDESONIDE 0.5 MG/ - USE ONE VIAL TWICE DAILY Patient taking differently: Take 0.5 mg by nebulization in the morning and at bedtime. 11/29/22   Martina Sinner,  MD  folic acid (FOLVITE) 1 MG tablet Take 1 tablet (1 mg total) by mouth daily. Patient not taking: Reported on 06/03/2023 03/21/23   Merrilyn Puma, MD  levETIRAcetam (KEPPRA) 500 MG tablet Take 1 tablet (500 mg total) by mouth 2 (two) times daily. 12/07/22   Ihor Austin, NP  midodrine (PROAMATINE) 2.5 MG tablet Take 1 tablet (2.5 mg total) by mouth 2 (two) times daily with a meal. 07/09/23 08/08/23  Pahwani, Kasandra Knudsen, MD  Multiple Vitamin (MULTIVITAMIN WITH MINERALS) TABS tablet Take 1 tablet by mouth daily. Patient taking differently: Take 1 tablet by mouth in the morning and at bedtime. 09/20/22   Doran Stabler, DO  pantoprazole (PROTONIX) 40 MG tablet Take 1 tablet (40 mg total) by mouth daily. 07/10/23 08/09/23  Pahwani, Kasandra Knudsen, MD  sucralfate (CARAFATE) 1 GM/10ML suspension Take 10 mLs (1 g total) by mouth 4 (four) times daily -  with meals and at bedtime. Patient not taking: Reported on 07/04/2023 07/02/23   Nira Conn, MD  Tiotropium Bromide-Olodaterol (STIOLTO RESPIMAT) 2.5-2.5 MCG/ACT AERS NEW PRESCRIPTION REQUEST: STIOLTO 2.5 MCG- INHALE TWO PUFFS BY MOUTH DAILY Patient taking differently: Inhale 2 each into the lungs daily. 11/29/22   Martina Sinner, MD      Allergies    Patient has no known allergies.    Review of Systems   Review of Systems  Unable  to perform ROS: Patient nonverbal    Physical Exam Updated Vital Signs BP (!) 141/68   Pulse (!) 111   Temp (!) 100.5 F (38.1 C) (Axillary)   Resp (!) 30   Ht 6' (1.829 m)   Wt 74.8 kg   SpO2 92%   BMI 22.38 kg/m  Physical Exam Constitutional:      Appearance: He is well-developed.  HENT:     Head: Normocephalic and atraumatic.  Eyes:     Pupils: Pupils are equal, round, and reactive to light.  Cardiovascular:     Rate and Rhythm: Normal rate and regular rhythm.     Heart sounds: Normal heart sounds.  Pulmonary:     Effort: Pulmonary effort is normal. No respiratory distress.     Breath sounds: Normal  breath sounds. No wheezing or rales.  Chest:     Chest wall: No tenderness.  Abdominal:     General: Bowel sounds are normal.     Palpations: Abdomen is soft.     Tenderness: There is abdominal tenderness (Tenderness to the right mid abdomen, JP drain is noted, minimal redness around the insertion site.  There is some purulent type drainage). There is no guarding or rebound.  Musculoskeletal:        General: Normal range of motion.     Cervical back: Normal range of motion and neck supple.  Lymphadenopathy:     Cervical: No cervical adenopathy.  Skin:    General: Skin is warm and dry.     Findings: No rash.  Neurological:     Mental Status: He is alert.     Comments: Patient has some right-sided weakness and aphasia which reportedly is old.     ED Results / Procedures / Treatments   Labs (all labs ordered are listed, but only abnormal results are displayed) Labs Reviewed  COMPREHENSIVE METABOLIC PANEL - Abnormal; Notable for the following components:      Result Value   Glucose, Bld 168 (*)    Creatinine, Ser 1.32 (*)    All other components within normal limits  CBC WITH DIFFERENTIAL/PLATELET - Abnormal; Notable for the following components:   WBC 32.4 (*)    Neutro Abs 29.4 (*)    Monocytes Absolute 1.5 (*)    Abs Immature Granulocytes 0.27 (*)    All other components within normal limits  PROTIME-INR - Abnormal; Notable for the following components:   Prothrombin Time 17.4 (*)    INR 1.4 (*)    All other components within normal limits  URINALYSIS, W/ REFLEX TO CULTURE (INFECTION SUSPECTED) - Abnormal; Notable for the following components:   Specific Gravity, Urine 1.032 (*)    Protein, ur 30 (*)    All other components within normal limits  I-STAT CG4 LACTIC ACID, ED - Abnormal; Notable for the following components:   Lactic Acid, Venous 6.2 (*)    All other components within normal limits  I-STAT CHEM 8, ED - Abnormal; Notable for the following components:    Glucose, Bld 165 (*)    TCO2 21 (*)    All other components within normal limits  I-STAT CG4 LACTIC ACID, ED - Abnormal; Notable for the following components:   Lactic Acid, Venous 5.0 (*)    All other components within normal limits  RESP PANEL BY RT-PCR (RSV, FLU A&B, COVID)  RVPGX2  CULTURE, BLOOD (ROUTINE X 2)  CULTURE, BLOOD (ROUTINE X 2)  URINE CULTURE  APTT  LIPASE, BLOOD  CK  OSMOLALITY  OSMOLALITY, URINE  MAGNESIUM  PHOSPHORUS  TSH  PREALBUMIN  BLOOD GAS, VENOUS  VITAMIN B12  FOLATE  IRON AND TIBC  FERRITIN  RETICULOCYTES  CBC WITH DIFFERENTIAL/PLATELET  COMPREHENSIVE METABOLIC PANEL  URINALYSIS, ROUTINE W REFLEX MICROSCOPIC  PROCALCITONIN    EKG None  Radiology CT ABDOMEN PELVIS W CONTRAST  Result Date: 07/20/2023 CLINICAL DATA:  Status post laparoscopic cholecystectomy for 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 EXAM: CT ABDOMEN AND PELVIS WITH CONTRAST TECHNIQUE: Multidetector CT imaging of the abdomen and pelvis was performed using the standard protocol following bolus administration of intravenous contrast. RADIATION DOSE REDUCTION: This exam was performed according to the departmental dose-optimization program which includes automated exposure control, adjustment of the mA and/or kV according to patient size and/or use of iterative reconstruction technique. CONTRAST:  80mL OMNIPAQUE IOHEXOL 300 MG/ML  SOLN COMPARISON:  07/12/2023 FINDINGS: Lower chest: Trace atelectasis or scarring noted dependent left lung base. Hepatobiliary: No suspicious focal abnormality within the liver parenchyma. 4.0 x 3.4 x 2.5 cm complex fluid collection is identified in the gallbladder fossa containing a small gas bubble (axial 33/2 and coronal 100/7). The tip of a surgical drain is positioned in the posterior gallbladder fossa adjacent to the surgical clips coursing along the posteroinferior liver margin and out the right anterior abdominal wall. Pancreas: No focal mass  lesion. No dilatation of the main duct. No intraparenchymal cyst. No peripancreatic edema. Spleen: No splenomegaly. No suspicious focal mass lesion. Adrenals/Urinary Tract: No adrenal nodule or mass. Kidneys unremarkable. No evidence for hydroureter. The urinary bladder appears normal for the degree of distention. Stomach/Bowel: Stomach is unremarkable. No gastric wall thickening. No evidence of outlet obstruction. Duodenum is normally positioned as is the ligament of Treitz. No small bowel wall thickening. No small bowel dilatation. The terminal ileum is normal. The appendix is normal. There is some wall thickening in the splenic flexure of the colon as it courses under the liver. Edema/inflammation is seen around the splenic flexure of the colon and in the right omentum with fluid and edema around the right liver and tracking towards Morison's pouch. Left colon unremarkable. Vascular/Lymphatic: There is moderate atherosclerotic calcification of the abdominal aorta without aneurysm. 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 There is no gastrohepatic or hepatoduodenal ligament lymphadenopathy. No retroperitoneal or mesenteric lymphadenopathy. No pelvic sidewall lymphadenopathy. Reproductive: Prostate gland is heterogeneous. Other: Small volume free fluid is seen in the pelvis. Extraluminal gas is seen in the midline anterior pelvis and anterior left lower quadrant towards the left groin region. This gas may be extraperitoneal in the preperitoneal fat. Musculoskeletal: No worrisome lytic or sclerotic osseous abnormality. IMPRESSION: 1. 4.0 x 3.4 x 2.5 cm complex fluid collection in the gallbladder fossa containing a small gas bubble. Gas from surgery would not be expected after about 7-10 days. Gas could also be due to the presence of the surgical drain, but infection/evolving abscess in the gallbladder fossa a distinct concern. 2.  Edema/inflammation around the splenic flexure of the colon and in the right omentum with fluid and edema around the right liver and tracking towards Morison's pouch. Given the recent surgery and findings in the gallbladder fossa, this is probably secondary inflammatory change. Focal colitis with edema spreading up under the liver is considered less likely 3. Extraluminal gas in the midline anterior pelvis and anterior left lower quadrant towards the left groin region. Location of this small volume gas suggests that it is  extraperitoneal, in the preperitoneal space. Again, while gas after surgery is not typical after about 7-10 days, lack of free gas in the peritoneal cavity makes hollow viscus perforation unlikely. Nevertheless, close clinical follow-up recommended. 4. Small volume free fluid in the pelvis. 5. 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 6.  Aortic Atherosclerosis (ICD10-I70.0). Electronically Signed   By: Kennith Center M.D.   On: 07/20/2023 16:54   DG Chest Port 1 View  Result Date: 07/20/2023 CLINICAL DATA:  Sepsis. Cancer patient. Recent surgery. Known lung mass. EXAM: PORTABLE CHEST 1 VIEW COMPARISON:  X-ray 07/04/2023 and older.  CT angiogram 07/11/2023 FINDINGS: Slight elevation of the left hemidiaphragm. There is some patchy left perihilar opacity with some volume loss which is stable. Fiduciary marker in the peripheral right lung with a spiculated nodule, similar as well. No consolidation, pneumothorax or effusion which is new. Chronic lung changes. Dense likely calcified right apical nodules. Normal cardiopericardial silhouette. Film is rotated to the left. Scattered degenerative changes and osteopenia. Overlapping cardiac leads. IMPRESSION: Stable appearance of the thorax. Fiduciary marker in the right lung with a focal spiculated nodule is stable. Stable bandlike opacity left perihilar with volume loss.  Electronically Signed   By: Karen Kays M.D.   On: 07/20/2023 15:40    Procedures Procedures    Medications Ordered in ED Medications  lactated ringers infusion ( Intravenous Restarted 07/20/23 1752)  metroNIDAZOLE (FLAGYL) IVPB 500 mg (has no administration in time range)  ceFEPIme (MAXIPIME) 2 g in sodium chloride 0.9 % 100 mL IVPB (has no administration in time range)  vancomycin (VANCOREADY) IVPB 1500 mg/300 mL (has no administration in time range)  lactated ringers bolus 1,000 mL (0 mLs Intravenous Stopped 07/20/23 1712)    And  lactated ringers bolus 1,000 mL (0 mLs Intravenous Stopped 07/20/23 1752)    And  lactated ringers bolus 250 mL (0 mLs Intravenous Stopped 07/20/23 1752)  ceFEPIme (MAXIPIME) 2 g in sodium chloride 0.9 % 100 mL IVPB (0 g Intravenous Stopped 07/20/23 1620)  metroNIDAZOLE (FLAGYL) IVPB 500 mg (0 mg Intravenous Stopped 07/20/23 1712)  vancomycin (VANCOREADY) IVPB 1500 mg/300 mL (0 mg Intravenous Stopped 07/20/23 1840)  iohexol (OMNIPAQUE) 300 MG/ML solution 80 mL (80 mLs Intravenous Contrast Given 07/20/23 1601)    ED Course/ Medical Decision Making/ A&P                                 Medical Decision Making Amount and/or Complexity of Data Reviewed Labs: ordered. Radiology: ordered. ECG/medicine tests: ordered.  Risk Prescription drug management. Decision regarding hospitalization.   Patient is a 62 year old who presents with fever.  He was hypotensive on arrival and tachycardic.  Sepsis protocol was initiated with IV fluids at 30 cc/kg, IV antibiotics.  His labs show an elevated white count at 30,000.  He has a mild AKI.  His lactate is over 6.  Repeat lactate after fluids is 5.  His blood pressure has responded to fluids.  His heart rate is coming down.  CT scan abdomen pelvis showed enlarging fluid collection around his gallbladder fossa.  I spoke with Dr. Dwain Sarna with surgery who will see the patient.  He request medicine admission.  I spoke with Dr. Adela Glimpse  who will admit the patient.  She had requested I consult CCM to make sure that they did not want him in the ICU given  his elevated lactate.  I did speak with the intensivist who recommended admission by the hospitalist team to progressive care and they will be available if needed.  CRITICAL CARE Performed by: Rolan Bucco Total critical care time: 60 minutes Critical care time was exclusive of separately billable procedures and treating other patients. Critical care was necessary to treat or prevent imminent or life-threatening deterioration. Critical care was time spent personally by me on the following activities: development of treatment plan with patient and/or surrogate as well as nursing, discussions with consultants, evaluation of patient's response to treatment, examination of patient, obtaining history from patient or surrogate, ordering and performing treatments and interventions, ordering and review of laboratory studies, ordering and review of radiographic studies, pulse oximetry and re-evaluation of patient's condition.   Final Clinical Impression(s) / ED Diagnoses Final diagnoses:  Sepsis without acute organ dysfunction, due to unspecified organism [facility])  Intra-abdominal abscess [facility])    Rx / DC Orders ED Discharge Orders     None         Rolan Bucco, MD 07/20/23 1933

## 2023-07-20 NOTE — Consult Note (Signed)
Reason for Consult:sepsis s/p lap chole Referring Physician: Dr Todd Estrada is an 62 y.o. male.  HPI: 72 yom with significant pmh of pad on eliquis (not sure when he last took this), stroke with aphasia and hemiparesis, seizures, copd, lung cancer who underwent lap chole with Dr Michaell Cowing 7/17 noted empyema.  Drain left at time of surgery. Postop had ercp 7/18 with removal of cbd stones.  Send home with drain and follow up.  He returns today. History quite limited due to his aphasia.  Not sure where he came from.  Apparently has been having some progressive weakness and possible fever.   He came to the er and is found to have elevated wbc, elevated lactic acid, INR 1.4 and a ct scan that shows complex ruq fluid collection with surgical drain near this, some inflammation of hepatic flexure of his colon and small volume free fluid, thought to have some extraperitoneal air.  I was asked to see him.  His drain is purulent.   Past Medical History:  Diagnosis Date   Asthma    Atypical chest pain 08/13/2022   Community acquired pneumonia 09/14/2022   COPD (chronic obstructive pulmonary disease) (HCC)    Essential hypertension 08/19/2021   GERD (gastroesophageal reflux disease)    HAP (hospital-acquired pneumonia) 09/16/2022   History of tracheostomy    03/09/22-04/11/22   HLD (hyperlipidemia)    Hypertension    Hypokalemia 08/13/2022   Hypomagnesemia 08/13/2022   Lung cancer (HCC)    PAD (peripheral artery disease) (HCC)    Seizures (HCC) 06/02/2022   Sepsis (HCC) 08/13/2022   Sepsis due to pneumonia (HCC) 04/13/2023   Stroke (HCC) 02/2022    Past Surgical History:  Procedure Laterality Date   BRONCHIAL BIOPSY  05/30/2022   Procedure: BRONCHIAL BIOPSIES;  Surgeon: Leslye Peer, MD;  Location: [facility] ENDOSCOPY;  Service: Pulmonary;;   BRONCHIAL BRUSHINGS  05/30/2022   Procedure: BRONCHIAL BRUSHINGS;  Surgeon: Leslye Peer, MD;  Location: [facility] ENDOSCOPY;  Service: Pulmonary;;   BRONCHIAL  NEEDLE ASPIRATION BIOPSY  05/30/2022   Procedure: BRONCHIAL NEEDLE ASPIRATION BIOPSIES;  Surgeon: Leslye Peer, MD;  Location: [facility] ENDOSCOPY;  Service: Pulmonary;;   BRONCHIAL WASHINGS  05/30/2022   Procedure: BRONCHIAL WASHINGS;  Surgeon: Leslye Peer, MD;  Location: [facility] ENDOSCOPY;  Service: Pulmonary;;   CHOLECYSTECTOMY N/A 07/05/2023   Procedure: LAPAROSCOPIC CHOLECYSTECTOMY WITH INTRAOPERATIVE CHOLANGIOGRAM;  Surgeon: Karie Soda, MD;  Location: [facility] ORS;  Service: General;  Laterality: N/A;   ERCP N/A 07/06/2023   Procedure: ENDOSCOPIC RETROGRADE CHOLANGIOPANCREATOGRAPHY (ERCP);  Surgeon: Vida Rigger, MD;  Location: [facility] ENDOSCOPY;  Service: Gastroenterology;  Laterality: N/A;   ESOPHAGOGASTRODUODENOSCOPY (EGD) WITH PROPOFOL N/A 03/11/2022   Procedure: ESOPHAGOGASTRODUODENOSCOPY (EGD) WITH PROPOFOL;  Surgeon: Violeta Gelinas, MD;  Location: [facility] ENDOSCOPY;  Service: General;  Laterality: N/A;   FIDUCIAL MARKER PLACEMENT  05/30/2022   Procedure: FIDUCIAL MARKER PLACEMENT;  Surgeon: Leslye Peer, MD;  Location: [facility] ENDOSCOPY;  Service: Pulmonary;;   IR ANGIO INTRA EXTRACRAN SEL COM CAROTID INNOMINATE UNI R MOD SED  03/02/2022   IR CT HEAD LTD  03/02/2022   IR PERCUTANEOUS ART THROMBECTOMY/INFUSION INTRACRANIAL INC DIAG ANGIO  03/02/2022   PEG PLACEMENT N/A 03/11/2022   Procedure: PERCUTANEOUS ENDOSCOPIC GASTROSTOMY (PEG) PLACEMENT;  Surgeon: Violeta Gelinas, MD;  Location: [facility] ENDOSCOPY;  Service: General;  Laterality: N/A;   RADIOLOGY WITH ANESTHESIA N/A 03/02/2022   Procedure: IR WITH ANESTHESIA;  Surgeon: Julieanne Cotton, MD;  Location: [facility] OR;  Service: Radiology;  Laterality: N/A;   REMOVAL OF STONES  07/06/2023   Procedure: REMOVAL OF STONES;  Surgeon: Vida Rigger, MD;  Location: [facility] ENDOSCOPY;  Service: Gastroenterology;;   Dennison Mascot  07/06/2023   Procedure: Dennison Mascot;  Surgeon: Vida Rigger, MD;  Location: [facility] ENDOSCOPY;  Service: Gastroenterology;;   VIDEO BRONCHOSCOPY WITH RADIAL  ENDOBRONCHIAL ULTRASOUND  05/30/2022   Procedure: VIDEO BRONCHOSCOPY WITH RADIAL ENDOBRONCHIAL ULTRASOUND;  Surgeon: Leslye Peer, MD;  Location: [facility] ENDOSCOPY;  Service: Pulmonary;;    Family History  Problem Relation Age of Onset   Throat cancer Mother    Liver cancer Father    Kidney failure Sister    Cancer - Lung Paternal Uncle     Social History:  reports that he quit smoking about 14 months ago. His smoking use included cigarettes. He has never been exposed to tobacco smoke. He has never used smokeless tobacco. He reports that he does not currently use alcohol. He reports that he does not use drugs.  Allergies: No Known Allergies  Current Facility-Administered Medications  Medication Dose Route Frequency Provider Last Rate Last Admin   lactated ringers infusion   Intravenous Continuous Rolan Bucco, MD 150 mL/hr at 07/20/23 1752 Restarted at 07/20/23 1752   vancomycin (VANCOREADY) IVPB 1500 mg/300 mL  1,500 mg Intravenous Once Rolan Bucco, MD 150 mL/hr at 07/20/23 1627 1,500 mg at 07/20/23 1627   Current Outpatient Medications  Medication Sig Dispense Refill   albuterol (PROVENTIL) (2.5 MG/3ML) 0.083% nebulizer solution Take 3 mLs (2.5 mg total) by nebulization every 4 (four) hours as needed for wheezing or shortness of breath. (Patient not taking: Reported on 04/13/2023) 90 mL 12   apixaban (ELIQUIS) 5 MG TABS tablet TAKE 1 TABLET(5 MG) BY MOUTH TWICE DAILY (Patient taking differently: Take 5 mg by mouth 2 (two) times daily.) 180 tablet 2   atorvastatin (LIPITOR) 40 MG tablet Take 1 tablet (40 mg total) by mouth daily. 90 tablet 3   budesonide (PULMICORT) 0.5 MG/2ML nebulizer solution NEW PRESCRIPTION REQEUST: BUDESONIDE 0.5 MG/ - USE ONE VIAL TWICE DAILY (Patient taking differently: Take 0.5 mg by nebulization in the morning and at bedtime.) 180 mL 3   folic acid (FOLVITE) 1 MG tablet Take 1 tablet (1 mg total) by mouth daily. (Patient not taking: Reported on 06/03/2023) 30  tablet 2   levETIRAcetam (KEPPRA) 500 MG tablet Take 1 tablet (500 mg total) by mouth 2 (two) times daily. 180 tablet 3   midodrine (PROAMATINE) 2.5 MG tablet Take 1 tablet (2.5 mg total) by mouth 2 (two) times daily with a meal. 60 tablet 0   Multiple Vitamin (MULTIVITAMIN WITH MINERALS) TABS tablet Take 1 tablet by mouth daily. (Patient taking differently: Take 1 tablet by mouth in the morning and at bedtime.) 30 tablet 2   pantoprazole (PROTONIX) 40 MG tablet Take 1 tablet (40 mg total) by mouth daily. 30 tablet 0   sucralfate (CARAFATE) 1 GM/10ML suspension Take 10 mLs (1 g total) by mouth 4 (four) times daily -  with meals and at bedtime. (Patient not taking: Reported on 07/04/2023) 420 mL 0   Tiotropium Bromide-Olodaterol (STIOLTO RESPIMAT) 2.5-2.5 MCG/ACT AERS NEW PRESCRIPTION REQUEST: STIOLTO 2.5 MCG- INHALE TWO PUFFS BY MOUTH DAILY (Patient taking differently: Inhale 2 each into the lungs daily.) 12 g 3     Results for orders placed or performed during the hospital encounter of 07/20/23 (from the past 48 hour(s))  Comprehensive metabolic panel     Status: Abnormal   Collection Time: 07/20/23  3:17 PM  Result Value Ref Range   Sodium 138 135 - 145 mmol/L   Potassium 4.5 3.5 - 5.1 mmol/L   Chloride 100 98 - 111 mmol/L   CO2 23 22 - 32 mmol/L   Glucose, Bld 168 (H) 70 - 99 mg/dL    Comment: Glucose reference range applies only to samples taken after fasting for at least 8 hours.   BUN 22 8 - 23 mg/dL   Creatinine, Ser 7.03 (H) 0.61 - 1.24 mg/dL   Calcium 9.5 8.9 - 50.0 mg/dL   Total Protein 8.1 6.5 - 8.1 g/dL   Albumin 3.8 3.5 - 5.0 g/dL   AST 22 15 - 41 U/L   ALT 18 0 - 44 U/L   Alkaline Phosphatase 80 38 - 126 U/L   Total Bilirubin 0.9 0.3 - 1.2 mg/dL   GFR, Estimated >93 >81 mL/min    Comment: (NOTE) Calculated using the CKD-EPI Creatinine Equation (2021)    Anion gap 15 5 - 15    Comment: Performed at [facility], [address] [place], [place]  [address]  CBC with Differential     Status: Abnormal   Collection Time: 07/20/23  3:17 PM  Result Value Ref Range   WBC 32.4 (H) 4.0 - 10.5 K/uL   RBC 4.87 4.22 - 5.81 MIL/uL   Hemoglobin 14.1 13.0 - 17.0 g/dL   HCT 71.6 96.7 - 89.3 %   MCV 94.3 80.0 - 100.0 fL   MCH 29.0 26.0 - 34.0 pg   MCHC 30.7 30.0 - 36.0 g/dL   RDW 81.0 17.5 - 10.2 %   Platelets 392 150 - 400 K/uL   nRBC 0.0 0.0 - 0.2 %   Neutrophils Relative % 91 %   Neutro Abs 29.4 (H) 1.7 - 7.7 K/uL   Lymphocytes Relative 3 %   Lymphs Abs 1.1 0.7 - 4.0 K/uL   Monocytes Relative 5 %   Monocytes Absolute 1.5 (H) 0.1 - 1.0 K/uL   Eosinophils Relative 0 %   Eosinophils Absolute 0.0 0.0 - 0.5 K/uL   Basophils Relative 0 %   Basophils Absolute 0.1 0.0 - 0.1 K/uL   Immature Granulocytes 1 %   Abs Immature Granulocytes 0.27 (H) 0.00 - 0.07 K/uL    Comment: Performed at [facility], [address] [place], [place] [address]  Protime-INR     Status: Abnormal   Collection Time: 07/20/23  3:17 PM  Result Value Ref Range   Prothrombin Time 17.4 (H) 11.4 - 15.2 seconds   INR 1.4 (H) 0.8 - 1.2    Comment: (NOTE) INR goal varies based on device and disease states. Performed at [facility], [address], [place], [place] [address]   APTT     Status: None   Collection Time: 07/20/23  3:17 PM  Result Value Ref Range   aPTT 32 24 - 36 seconds    Comment: Performed at [facility], [address], [place], [place] [address]  Lipase, blood     Status: None   Collection Time: 07/20/23  3:17 PM  Result Value Ref Range   Lipase 28 11 - 51 U/L    Comment: Performed at [facility], [address], [place], [place] [address]  Resp panel by RT-PCR (RSV, Flu A&B, Covid) Anterior Nasal Swab     Status: None   Collection Time: 07/20/23  3:21 PM   Specimen: Anterior Nasal Swab  Result Value Ref Range   SARS Coronavirus 2 by RT PCR NEGATIVE NEGATIVE    Comment:  (NOTE) SARS-CoV-2 target nucleic acids are NOT DETECTED.  The SARS-CoV-2 RNA is generally detectable in upper respiratory specimens during the acute phase of infection. The lowest concentration of SARS-CoV-2 viral copies this assay can detect is 138 copies/mL. A negative result does not preclude SARS-Cov-2 infection and should not be used as the sole basis for treatment or other patient management decisions. A negative result may occur with  improper specimen collection/handling, submission of specimen other than nasopharyngeal swab, presence of viral mutation(s) within the areas targeted by this assay, and inadequate number of viral copies(<138 copies/mL). A negative result must be combined with clinical observations, patient history, and epidemiological information. The expected result is Negative.  Fact Sheet for Patients:  BloggerCourse.com  Fact Sheet for Healthcare Providers:  SeriousBroker.it  This test is no t yet approved or cleared by the [place] FDA and  has been authorized for detection and/or diagnosis of SARS-CoV-2 by FDA under an Emergency Use Authorization (EUA). This EUA will remain  in effect (meaning this test can be used) for the duration of the COVID-19 declaration under Section 564(b)(1) of the Act, 21 U.S.C.section 360bbb-3(b)(1), unless the authorization is terminated  or revoked sooner.       Influenza A by PCR NEGATIVE NEGATIVE   Influenza B by PCR NEGATIVE NEGATIVE    Comment: (NOTE) The Xpert Xpress SARS-CoV-2/FLU/RSV plus assay is intended as an aid in the diagnosis of influenza from Nasopharyngeal swab specimens and should not be used as a sole basis for treatment. Nasal washings and aspirates are unacceptable for Xpert Xpress SARS-CoV-2/FLU/RSV testing.  Fact Sheet for Patients: BloggerCourse.com  Fact Sheet for Healthcare  Providers: SeriousBroker.it  This test is not yet approved or cleared by the [place] FDA and has been authorized for detection and/or diagnosis of SARS-CoV-2 by FDA under an Emergency Use Authorization (EUA). This EUA will remain in effect (meaning this test can be used) for the duration of the COVID-19 declaration under Section 564(b)(1) of the Act, 21 U.S.C. section 360bbb-3(b)(1), unless the authorization is terminated or revoked.     Resp Syncytial Virus by PCR NEGATIVE NEGATIVE    Comment: (NOTE) Fact Sheet for Patients: BloggerCourse.com  Fact Sheet for Healthcare Providers: SeriousBroker.it  This test is not yet approved or cleared by the [place] FDA and has been authorized for detection and/or diagnosis of SARS-CoV-2 by FDA under an Emergency Use Authorization (EUA). This EUA will remain in effect (meaning this test can be used) for the duration of the COVID-19 declaration under Section 564(b)(1) of the Act, 21 U.S.C. section 360bbb-3(b)(1), unless the authorization is terminated or revoked.  Performed at [facility], [address] [place], [place] [address]   I-Stat Lactic Acid, ED     Status: Abnormal   Collection Time: 07/20/23  3:37 PM  Result Value Ref Range   Lactic Acid, Venous 6.2 (HH) 0.5 - 1.9 mmol/L   Comment NOTIFIED PHYSICIAN   I-stat chem 8, ED     Status: Abnormal   Collection Time: 07/20/23  3:38 PM  Result Value Ref Range   Sodium 138 135 - 145 mmol/L   Potassium 4.5 3.5 - 5.1 mmol/L   Chloride 100 98 - 111 mmol/L   BUN 21 8 - 23 mg/dL   Creatinine, Ser 2.84 0.61 - 1.24 mg/dL   Glucose, Bld 132 (H) 70 - 99 mg/dL  Comment: Glucose reference range applies only to samples taken after fasting for at least 8 hours.   Calcium, Ion 1.15 1.15 - 1.40 mmol/L   TCO2 21 (L) 22 - 32 mmol/L   Hemoglobin 16.3 13.0 - 17.0 g/dL   HCT 04.5 40.9 - 81.1 %   I-Stat Lactic Acid, ED     Status: Abnormal   Collection Time: 07/20/23  6:02 PM  Result Value Ref Range   Lactic Acid, Venous 5.0 (HH) 0.5 - 1.9 mmol/L   Comment NOTIFIED PHYSICIAN     CT ABDOMEN PELVIS W CONTRAST  Result Date: 07/20/2023 CLINICAL DATA:  Status post laparoscopic cholecystectomy for 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 EXAM: CT ABDOMEN AND PELVIS WITH CONTRAST TECHNIQUE: Multidetector CT imaging of the abdomen and pelvis was performed using the standard protocol following bolus administration of intravenous contrast. RADIATION DOSE REDUCTION: This exam was performed according to the departmental dose-optimization program which includes automated exposure control, adjustment of the mA and/or kV according to patient size and/or use of iterative reconstruction technique. CONTRAST:  80mL OMNIPAQUE IOHEXOL 300 MG/ML  SOLN COMPARISON:  07/12/2023 FINDINGS: Lower chest: Trace atelectasis or scarring noted dependent left lung base. Hepatobiliary: No suspicious focal abnormality within the liver parenchyma. 4.0 x 3.4 x 2.5 cm complex fluid collection is identified in the gallbladder fossa containing a small gas bubble (axial 33/2 and coronal 100/7). The tip of a surgical drain is positioned in the posterior gallbladder fossa adjacent to the surgical clips coursing along the posteroinferior liver margin and out the right anterior abdominal wall. Pancreas: No focal mass lesion. No dilatation of the main duct. No intraparenchymal cyst. No peripancreatic edema. Spleen: No splenomegaly. No suspicious focal mass lesion. Adrenals/Urinary Tract: No adrenal nodule or mass. Kidneys unremarkable. No evidence for hydroureter. The urinary bladder appears normal for the degree of distention. Stomach/Bowel: Stomach is unremarkable. No gastric wall thickening. No evidence of outlet obstruction. Duodenum is normally positioned as is the ligament of Treitz. No small bowel wall thickening. No small  bowel dilatation. The terminal ileum is normal. The appendix is normal. There is some wall thickening in the splenic flexure of the colon as it courses under the liver. Edema/inflammation is seen around the splenic flexure of the colon and in the right omentum with fluid and edema around the right liver and tracking towards Morison's pouch. Left colon unremarkable. Vascular/Lymphatic: There is moderate atherosclerotic calcification of the abdominal aorta without aneurysm. 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 There is no gastrohepatic or hepatoduodenal ligament lymphadenopathy. No retroperitoneal or mesenteric lymphadenopathy. No pelvic sidewall lymphadenopathy. Reproductive: Prostate gland is heterogeneous. Other: Small volume free fluid is seen in the pelvis. Extraluminal gas is seen in the midline anterior pelvis and anterior left lower quadrant towards the left groin region. This gas may be extraperitoneal in the preperitoneal fat. Musculoskeletal: No worrisome lytic or sclerotic osseous abnormality. IMPRESSION: 1. 4.0 x 3.4 x 2.5 cm complex fluid collection in the gallbladder fossa containing a small gas bubble. Gas from surgery would not be expected after about 7-10 days. Gas could also be due to the presence of the surgical drain, but infection/evolving abscess in the gallbladder fossa a distinct concern. 2. Edema/inflammation around the splenic flexure of the colon and in the right omentum with fluid and edema around the right liver and tracking towards Morison's pouch. Given the recent surgery and findings in the gallbladder fossa, this is probably secondary inflammatory  change. Focal colitis with edema spreading up under the liver is considered less likely 3. Extraluminal gas in the midline anterior pelvis and anterior left lower quadrant towards the left groin region. Location of this small volume gas suggests that it is  extraperitoneal, in the preperitoneal space. Again, while gas after surgery is not typical after about 7-10 days, lack of free gas in the peritoneal cavity makes hollow viscus perforation unlikely. Nevertheless, close clinical follow-up recommended. 4. Small volume free fluid in the pelvis. 5. 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 6.  Aortic Atherosclerosis (ICD10-I70.0). Electronically Signed   By: Kennith Center M.D.   On: 07/20/2023 16:54   DG Chest Port 1 View  Result Date: 07/20/2023 CLINICAL DATA:  Sepsis. Cancer patient. Recent surgery. Known lung mass. EXAM: PORTABLE CHEST 1 VIEW COMPARISON:  X-ray 07/04/2023 and older.  CT angiogram 07/11/2023 FINDINGS: Slight elevation of the left hemidiaphragm. There is some patchy left perihilar opacity with some volume loss which is stable. Fiduciary marker in the peripheral right lung with a spiculated nodule, similar as well. No consolidation, pneumothorax or effusion which is new. Chronic lung changes. Dense likely calcified right apical nodules. Normal cardiopericardial silhouette. Film is rotated to the left. Scattered degenerative changes and osteopenia. Overlapping cardiac leads. IMPRESSION: Stable appearance of the thorax. Fiduciary marker in the right lung with a focal spiculated nodule is stable. Stable bandlike opacity left perihilar with volume loss. Electronically Signed   By: Karen Kays M.D.   On: 07/20/2023 15:40    Review of Systems  Unable to perform ROS: Patient nonverbal   Blood pressure 112/70, pulse (!) 105, temperature 100.3 F (37.9 C), temperature source Oral, resp. rate (!) 23, height 6' (1.829 m), weight 74.8 kg, SpO2 96%. Physical Exam Vitals reviewed.  Constitutional:      Appearance: Normal appearance. He is ill-appearing.  Eyes:     General: No scleral icterus. Cardiovascular:     Rate and Rhythm: Regular rhythm. Tachycardia present.   Pulmonary:     Effort: Pulmonary effort is normal.     Breath sounds: No wheezing.  Abdominal:     Palpations: Abdomen is soft.     Comments: Drain with brownish output, site is red, his original surgical dressings were still in place, no wound infections, really not that tender at all, nondistended   Musculoskeletal:     Right lower leg: No edema.     Left lower leg: No edema.  Skin:    General: Skin is warm and dry.     Capillary Refill: Capillary refill takes less than 2 seconds.     Coloration: Skin is not jaundiced.  Neurological:     Mental Status: He is alert.     Assessment/Plan: RUQ fluid collection s/p lap chole -no indication for surgery -drain in place is functional, I stripped it but may need more drainage given ct scan and clinical picture. I have placed IR consult -leave npo, no eliquis- hopefully we can figure out with some family when the last time he took this was -agree with abx and resuscitation, can trend lactate -would hold pharm dvt prophy for now until IR decision made -will follow with you    I personally reviewed images of ct scan showing ruq fluid collectoin. I personally discussed this patient's care with Dr Fredderick Phenix about abx,, admission. I reviewed recent lab values, vitals, and notes.     Todd Estrada 07/20/2023, 6:14  PM

## 2023-07-20 NOTE — Progress Notes (Signed)
PHARMACY NOTE -  Zosyn  Pharmacy has been assisting with dosing of Zosyn for suspected intra-abdominal infection leading to sepsis. Patient noted to be actively seizing (Hx epilepsy on Keppra PTA) Cefepime being changed to Zosyn to minimize neurotoxicity Dosage remains stable at 3.375 g IV q8 hr and further renal adjustments per institutional Pharmacy antibiotic protocol Will need to follow SCr closely, as Zosyn synergistically increases nephrotoxic potential of vancomycin  Pharmacy will sign off, following peripherally for culture results, dose adjustments, and length of therapy. Please reconsult if a change in clinical status warrants re-evaluation of dosage.  Bernadene Person, PharmD, BCPS [phone] 07/20/2023, 10:10 PM

## 2023-07-20 NOTE — Assessment & Plan Note (Signed)
Sent message to  dr. Arbutus Ped to let them know patient has been admitted

## 2023-07-20 NOTE — Assessment & Plan Note (Signed)
Hold Lipitor while patient n.p.o.

## 2023-07-20 NOTE — Assessment & Plan Note (Signed)
Chronic secondary to prior stroke

## 2023-07-20 NOTE — Assessment & Plan Note (Signed)
Will need nutritional consult

## 2023-07-20 NOTE — Assessment & Plan Note (Signed)
Appreciate general surgery consult At this point they state no indication for surgery Drain is in place and functional They will place a consult for IR Continue n.p.o. hold Eliquis continue IV antibiotics and fluid resuscitation

## 2023-07-20 NOTE — Subjective & Objective (Signed)
Pt had cholecystectomy July 17th sp JP drain  Was seen in ER found to have small fluid collection but was able to go home Came back with fever, and increased pain unable to provide hx due to aphasia

## 2023-07-20 NOTE — Assessment & Plan Note (Signed)
-  SIRS criteria met with  elevated white blood cell count,       Component Value Date/Time   WBC 32.4 (H) 07/20/2023 1517   LYMPHSABS 1.1 07/20/2023 1517   LYMPHSABS 0.6 (L) 09/09/2022 0908     tachycardia    fever   RR >20 Today's Vitals   07/20/23 1511 07/20/23 1615 07/20/23 1800 07/20/23 1912  BP:  112/70 (!) 122/92   Pulse:  (!) 105 (!) 105   Resp:  (!) 23 (!) 25   Temp:    (!) 100.5 F (38.1 C)  TempSrc:    Axillary  SpO2:  96% 96%   Weight: 74.8 kg     Height: 6' (1.829 m)     PainSc: 10-Worst pain ever      Body mass index is 22.38 kg/m.  This patient meets SIRS Criteria and may be septic.      The recent clinical data is shown below. Vitals:   07/20/23 1511 07/20/23 1615 07/20/23 1800 07/20/23 1912  BP:  112/70 (!) 122/92   Pulse:  (!) 105 (!) 105   Resp:  (!) 23 (!) 25   Temp:    (!) 100.5 F (38.1 C)  TempSrc:    Axillary  SpO2:  96% 96%   Weight: 74.8 kg     Height: 6' (1.829 m)        -Most likely source being:  intra-abdominal,      Patient meeting criteria for Severe sepsis with    evidence of end organ damage/organ dysfunction such as   elevated lactic acid >2     Component Value Date/Time   LATICACIDVEN 5.0 (HH) 07/20/2023 1802    acute metabolic encephalopathy     Patient is meeting criteria for SEPTIC SHOCK with  lactic acid > 4 or multiple SBP readings below 90 mmhg or MAP reading below 65 mmhg   - Obtain serial lactic acid and procalcitonin level.  - Initiated IV antibiotics in ER: Antibiotics Given (last 72 hours)     Date/Time Action Medication Dose Rate   07/20/23 1538 New Bag/Given   metroNIDAZOLE (FLAGYL) IVPB 500 mg 500 mg 100 mL/hr   07/20/23 1541 New Bag/Given   ceFEPIme (MAXIPIME) 2 g in sodium chloride 0.9 % 100 mL IVPB 2 g 200 mL/hr   07/20/23 1627 New Bag/Given   vancomycin (VANCOREADY) IVPB 1500 mg/300 mL 1,500 mg 150 mL/hr       Will continue  on : Cefepime Vanco and Flagyl   - await results of blood and urine  culture  - Rehydrate aggressively  Intravenous fluids were administered         30cc/kg fluid Recommended ER consult PCCM  7:16 PM

## 2023-07-20 NOTE — Progress Notes (Signed)
[facility]link is following sepsis bundle. 

## 2023-07-20 NOTE — H&P (Signed)
Todd Estrada BJY:[id number] DOB: Aug 03, 1961 DOA: 07/20/2023     PCP: [name], MD   Outpatient Specialists: CARDS: Dr. Christell Constant, MD  Pulmonary  [facility]  Oncology   Dr. Shirline Frees GI magod  Patient arrived to ER on 07/20/23 at 1442 Referred by Attending Rolan Bucco, MD   Patient coming from:    home Lives With family    Chief Complaint:   Chief Complaint  Patient presents with   Code Sepsis    HPI: Todd Estrada is a 61 y.o. 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     Presented with abdominal pain and fever Pt had cholecystectomy July 17th sp JP drain  Was seen in ER found to have small fluid collection but was able to go home Came back with fever, and increased pain unable to provide hx due to aphasia   Was initially seen in ER on 14 July came back on 16th and was diagnosed with acute cholecystitis and choledocholithiasis status post lap chole with intraoperative cholangiogram by Dr. Michaell Cowing on 17th underwent ERCP with GI on 18th of biliary sphincterectomy and removal of choledocholithiasis surgical JP drain was placed and plan to continue for 10 days until 26 July He was treated with Zosyn for 5 days initially and Eliquis was finally resumed he was able to be discharged home with JP P drain on 21 July  Family states the shakes only started today Family says been twitching on the lest side today Been taking Keppra Last seizure was 2 years ago family is unsure what his seizures look like  Denies significant ETOH intake  Does not smoke  Lab Results  Component Value Date   SARSCOV2NAA NEGATIVE 07/20/2023   SARSCOV2NAA NEGATIVE 07/04/2023   SARSCOV2NAA NEGATIVE 04/13/2023   SARSCOV2NAA POSITIVE (A) 01/18/2023        Regarding pertinent Chronic problems:     Hyperlipidemia - *on statins  Lipitor (atorvastatin)  Lipid Panel     Component Value Date/Time   CHOL 80 07/05/2023 0511   TRIG 49 07/05/2023 0511   HDL 37 (L) 07/05/2023 0511   CHOLHDL 2.2 07/05/2023 0511   VLDL 10 07/05/2023 0511   LDLCALC 33 07/05/2023 0511       chronic CHF diastolic  - last echo  Recent Results (from the past 08657 hour(s))  ECHOCARDIOGRAM COMPLETE   Collection Time: 07/04/23  4:09 PM  Result Value   Weight 2,640   Height 72   BP 119/73   S' Lateral 3.90   Area-P 1/2 2.54   Est EF 60 - 65%   Narrative      ECHOCARDIOGRAM REPORT       IMPRESSIONS    1. Left ventricular ejection fraction, by estimation, is 60 to 65%. The left ventricle has normal function. Left ventricular endocardial border not optimally defined to evaluate regional wall motion. There is mild left ventricular hypertrophy. Left  ventricular diastolic parameters are consistent with Grade I diastolic dysfunction (impaired relaxation).  2. Right ventricular systolic function is mildly reduced. The right ventricular size is normal.  3. The mitral valve is normal in structure. Trivial mitral valve regurgitation. No evidence of mitral stenosis.  4. The aortic valve was not well visualized. Aortic valve regurgitation is not visualized. No aortic stenosis is present.  5. Aortic dilatation noted. There is borderline dilatation of  the ascending aorta, measuring 39 mm.  6. The inferior vena cava is normal in size with greater than 50% respiratory variability, suggesting right atrial pressure of 3 mmHg.  7. Technically very limited study with poor images even with Definity contrast. LV function appears normal. Suspect RV function mildly decreased.           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,       Chronic anemia - baseline hg Hemoglobin & Hematocrit  Recent Labs    07/11/23 2350 07/20/23 1517 07/20/23 1538  HGB 11.9* 14.1 16.3   Iron/TIBC/Ferritin/ %Sat    Component Value Date/Time   IRON 44 (L) 08/13/2022 0946   TIBC 165 (L) 08/13/2022 0946   FERRITIN 2,066 (H) 08/13/2022 0946   IRONPCTSAT 27 08/13/2022 0946   Seizure DO -   currently on Keppra    While in ER:     Lab Orders         Resp panel by RT-PCR (RSV, Flu A&B, Covid) Anterior Nasal Swab         Blood Culture (routine x 2)         Comprehensive metabolic panel         CBC with Differential         Protime-INR         APTT         Lipase, blood         Urinalysis, w/ Reflex to Culture (Infection Suspected) -Urine, Unspecified Source         I-Stat Lactic Acid, ED         I-stat chem 8, ED        CXR - Stable appearance of the thorax. Fiduciary marker in the right lung with a focal spiculated nodule is stable. Stable bandlike opacity left perihilar with volume loss.  CTabd/pelvis -  4.0 x 3.4 x 2.5 cm complex fluid collection in the gallbladder fossa containing a small gas bubble. Gas from surgery would not be expected after about 7-10 days. Gas could also be due to the presence of the surgical drain, but infection/evolving abscess in the gallbladder fossa a distinct concern. . Given the recent surgery and findings in the gallbladder fossa, this is probably secondary inflammatory change. Focal colitis with edema spreading up under the liver is considered less likely    Following Medications were ordered in ER: Medications  lactated ringers infusion ( Intravenous Restarted 07/20/23 1752)  lactated ringers bolus 1,000 mL (0 mLs Intravenous Stopped 07/20/23 1712)    And  lactated ringers bolus 1,000 mL (0 mLs  Intravenous Stopped 07/20/23 1752)    And  lactated ringers bolus 250 mL (0 mLs Intravenous Stopped 07/20/23 1752)  ceFEPIme (MAXIPIME) 2 g in sodium chloride 0.9 % 100 mL IVPB (0 g Intravenous Stopped 07/20/23 1620)  metroNIDAZOLE (FLAGYL) IVPB 500 mg (0 mg Intravenous Stopped 07/20/23 1712)  vancomycin (VANCOREADY) IVPB  1500 mg/300 mL (0 mg Intravenous Stopped 07/20/23 1840)  iohexol (OMNIPAQUE) 300 MG/ML solution 80 mL (80 mLs Intravenous Contrast Given 07/20/23 1601)    _______________________________________________________ ER Provider Called: General surgery    Dr Dwain Sarna They Recommend admit to medicine      SEEN in ER Keep n.p.o. IR consulted Hold Eliquis  ER Provider Called: PCCM They Recommend admit to medicine    ED Triage Vitals  Encounter Vitals Group     BP 07/20/23 1456 (!) 84/63     Systolic BP Percentile --      Diastolic BP Percentile --      Pulse Rate 07/20/23 1456 (!) 134     Resp 07/20/23 1456 20     Temp 07/20/23 1456 100.3 F (37.9 C)     Temp Source 07/20/23 1456 Oral     SpO2 07/20/23 1456 100 %     Weight 07/20/23 1511 165 lb (74.8 kg)     Height 07/20/23 1511 6' (1.829 m)     Head Circumference --      Peak Flow --      Pain Score 07/20/23 1511 10     Pain Loc --      Pain Education --      Exclude from Growth Chart --   ZOXW(96)@     _________________________________________ Significant initial  Findings: Abnormal Labs Reviewed  COMPREHENSIVE METABOLIC PANEL - Abnormal; Notable for the following components:      Result Value   Glucose, Bld 168 (*)    Creatinine, Ser 1.32 (*)    All other components within normal limits  CBC WITH DIFFERENTIAL/PLATELET - Abnormal; Notable for the following components:   WBC 32.4 (*)    Neutro Abs 29.4 (*)    Monocytes Absolute 1.5 (*)    Abs Immature Granulocytes 0.27 (*)    All other components within normal limits  PROTIME-INR - Abnormal; Notable for the following components:   Prothrombin Time 17.4 (*)     INR 1.4 (*)    All other components within normal limits  URINALYSIS, W/ REFLEX TO CULTURE (INFECTION SUSPECTED) - Abnormal; Notable for the following components:   Specific Gravity, Urine 1.032 (*)    Protein, ur 30 (*)    All other components within normal limits  I-STAT CG4 LACTIC ACID, ED - Abnormal; Notable for the following components:   Lactic Acid, Venous 6.2 (*)    All other components within normal limits  I-STAT CHEM 8, ED - Abnormal; Notable for the following components:   Glucose, Bld 165 (*)    TCO2 21 (*)    All other components within normal limits  I-STAT CG4 LACTIC ACID, ED - Abnormal; Notable for the following components:   Lactic Acid, Venous 5.0 (*)    All other components within normal limits       Cardiac Panel (last 3 results) Recent Labs    07/20/23 1948  CKTOTAL 22*     ECG: Ordered Personally reviewed and interpreted by me showing: HR : 134 Rhythm: Sinus tachycardia Borderline right axis deviation Anteroseptal infarct, old Nonspecific T abnormalities, lateral leads QTC 409  BNP (last 3 results) Recent Labs    10/12/22 0955 04/08/23 1135 05/16/23 2013  BNP 16.2 24.1 28.8     COVID-19 Labs    Lab Results  Component Value Date   SARSCOV2NAA NEGATIVE 07/20/2023   SARSCOV2NAA NEGATIVE 07/04/2023   SARSCOV2NAA NEGATIVE 04/13/2023   SARSCOV2NAA POSITIVE (A) 01/18/2023    ____________________ This  patient meets SIRS Criteria and may be septic.    The recent clinical data is shown below. Vitals:   07/20/23 1456 07/20/23 1511 07/20/23 1615 07/20/23 1800  BP: (!) 84/63  112/70 (!) 122/92  Pulse: (!) 134  (!) 105 (!) 105  Resp: 20  (!) 23 (!) 25  Temp: 100.3 F (37.9 C)     TempSrc: Oral     SpO2: 100%  96% 96%  Weight:  74.8 kg    Height:  6' (1.829 m)      WBC     Component Value Date/Time   WBC 32.4 (H) 07/20/2023 1517   LYMPHSABS 1.1 07/20/2023 1517   LYMPHSABS 0.6 (L) 09/09/2022 0908   MONOABS 1.5 (H) 07/20/2023 1517    EOSABS 0.0 07/20/2023 1517   EOSABS 0.1 09/09/2022 0908   BASOSABS 0.1 07/20/2023 1517   BASOSABS 0.1 09/09/2022 0908    Lactic Acid, Venous    Component Value Date/Time   LATICACIDVEN 5.0 (HH) 07/20/2023 1802    Lactic Acid, Venous    Component Value Date/Time   LATICACIDVEN 5.0 (HH) 07/20/2023 1802    Procalcitonin   Ordered      UA   no evidence of UTI     Urine analysis:    Component Value Date/Time   COLORURINE YELLOW 07/20/2023 1808   APPEARANCEUR CLEAR 07/20/2023 1808   LABSPEC 1.032 (H) 07/20/2023 1808   PHURINE 6.0 07/20/2023 1808   GLUCOSEU NEGATIVE 07/20/2023 1808   HGBUR NEGATIVE 07/20/2023 1808   BILIRUBINUR NEGATIVE 07/20/2023 1808   KETONESUR NEGATIVE 07/20/2023 1808   PROTEINUR 30 (A) 07/20/2023 1808   NITRITE NEGATIVE 07/20/2023 1808   LEUKOCYTESUR NEGATIVE 07/20/2023 1808    Results for orders placed or performed during the hospital encounter of 07/20/23  Resp panel by RT-PCR (RSV, Flu A&B, Covid) Anterior Nasal Swab     Status: None   Collection Time: 07/20/23  3:21 PM   Specimen: Anterior Nasal Swab  Result Value Ref Range Status   SARS Coronavirus 2 by RT PCR NEGATIVE NEGATIVE Final         Influenza A by PCR NEGATIVE NEGATIVE Final   Influenza B by PCR NEGATIVE NEGATIVE Final         Resp Syncytial Virus by PCR NEGATIVE NEGATIVE Final        Blood Culture (routine x 2)     Status: None (Preliminary result)   Collection Time: 07/20/23  3:21 PM   Specimen: BLOOD LEFT HAND  Result Value Ref Range Status   Specimen Description   Final    BLOOD LEFT HAND Performed at [facility], [address], [place], [place] [address]    Special Requests   Final    BOTTLES DRAWN AEROBIC AND ANAEROBIC Blood Culture adequate volume Performed at [facility], [address] [place], [place] [address]    Culture PENDING  Incomplete   Report Status PENDING  Incomplete    ABX started Antibiotics Given (last 72 hours)      Date/Time Action Medication Dose Rate   07/20/23 1538 New Bag/Given   metroNIDAZOLE (FLAGYL) IVPB 500 mg 500 mg 100 mL/hr   07/20/23 1541 New Bag/Given   ceFEPIme (MAXIPIME) 2 g in sodium chloride 0.9 % 100 mL IVPB 2 g 200 mL/hr   07/20/23 1627 New Bag/Given   vancomycin (VANCOREADY) IVPB 1500 mg/300 mL 1,500 mg 150 mL/hr       No results found for the last 90 days.  Venous  Blood Gas result:  pH  7.41 Acid-Base Excess 0.1 mmol/L   pCO2, Ven 39 Low  mmHg O2 Saturation 81.4 %  pO2, Ven 49 High  mmHg      ABG    Component Value Date/Time   PHART 7.49 (H) 03/28/2022 1205   PCO2ART 41 03/28/2022 1205   PO2ART 87 03/28/2022 1205   HCO3 31.2 (H) 03/28/2022 1205   TCO2 21 (L) 07/20/2023 1538   O2SAT 99.4 03/28/2022 1205    __________________________________________________________ Recent Labs  Lab 07/20/23 1517 07/20/23 1538  NA 138 138  K 4.5 4.5  CO2 23  --   GLUCOSE 168* 165*  BUN 22 21  CREATININE 1.32* 1.20  CALCIUM 9.5  --     Cr    Up from baseline see below Lab Results  Component Value Date   CREATININE 1.20 07/20/2023   CREATININE 1.32 (H) 07/20/2023   CREATININE 0.84 07/11/2023    Recent Labs  Lab 07/20/23 1517  AST 22  ALT 18  ALKPHOS 80  BILITOT 0.9  PROT 8.1  ALBUMIN 3.8   Lab Results  Component Value Date   CALCIUM 9.5 07/20/2023   PHOS 3.1 07/04/2023    Plt: Lab Results  Component Value Date   PLT 392 07/20/2023         Recent Labs  Lab 07/20/23 1517 07/20/23 1538  WBC 32.4*  --   NEUTROABS 29.4*  --   HGB 14.1 16.3  HCT 45.9 48.0  MCV 94.3  --   PLT 392  --     HG/HCT stable,      Component Value Date/Time   HGB 16.3 07/20/2023 1538   HGB 11.9 (L) 05/04/2023 1332   HGB 10.4 (L) 09/09/2022 0908   HCT 48.0 07/20/2023 1538   HCT 31.5 (L) 09/09/2022 0908   MCV 94.3 07/20/2023 1517   MCV 92 09/09/2022 0908    Recent Labs  Lab 07/20/23 1517  LIPASE 28       _______________________________________________ Hospitalist was called for admission for septic shock secondary to postsurgical infection The following Work up has been ordered so far:  Orders Placed This Encounter  Procedures   Resp panel by RT-PCR (RSV, Flu A&B, Covid) Anterior Nasal Swab   Blood Culture (routine x 2)   DG Chest Port 1 View   CT ABDOMEN PELVIS W CONTRAST   IR Radiologist Eval & Mgmt   Comprehensive metabolic panel   CBC with Differential   Protime-INR   APTT   Lipase, blood   Urinalysis, w/ Reflex to Culture (Infection Suspected) -Urine, Unspecified Source   Diet NPO time specified   Document height and weight   Assess and Document Glasgow Coma Scale   Document vital signs within 1-hour of fluid bolus completion. Notify provider of abnormal vital signs despite fluid resuscitation.   DO NOT delay antibiotics if unable to obtain blood culture.   Refer to Sidebar Report: Sepsis Sidebar ED/IP   Notify provider for difficulties obtaining IV access.   Insert peripheral IV x 2   Initiate Carrier Fluid Protocol   Code Sepsis activation.  This occurs automatically when order is signed and prioritizes pharmacy, lab, and radiology services for STAT collections and interventions.  If [facility] downtime, call [facility] [phone]) to activate Code Sepsis.   Consult to general surgery   Consult to hospitalist   Airborne and Contact precautions   I-Stat Lactic Acid, ED   I-stat chem 8, ED   ED EKG 12-Lead  EKG 12-Lead     OTHER Significant initial  Findings:  labs showing:     DM  labs:  HbA1C: No results for input(s): "HGBA1C" in the last 8760 hours.     CBG (last 3)  No results for input(s): "GLUCAP" in the last 72 hours.        Cultures:    Component Value Date/Time   SDES  07/20/2023 1521    BLOOD LEFT HAND Performed at [facility], [address], [place], [place] [address]    SPECREQUEST  07/20/2023 1521    BOTTLES DRAWN AEROBIC AND  ANAEROBIC Blood Culture adequate volume Performed at [facility], [address], [place], [place] [address]    CULT PENDING 07/20/2023 1521   REPTSTATUS PENDING 07/20/2023 1521     Radiological Exams on Admission: CT ABDOMEN PELVIS W CONTRAST  Result Date: 07/20/2023 CLINICAL DATA:  Status post laparoscopic cholecystectomy for 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 EXAM: CT ABDOMEN AND PELVIS WITH CONTRAST TECHNIQUE: Multidetector CT imaging of the abdomen and pelvis was performed using the standard protocol following bolus administration of intravenous contrast. RADIATION DOSE REDUCTION: This exam was performed according to the departmental dose-optimization program which includes automated exposure control, adjustment of the mA and/or kV according to patient size and/or use of iterative reconstruction technique. CONTRAST:  80mL OMNIPAQUE IOHEXOL 300 MG/ML  SOLN COMPARISON:  07/12/2023 FINDINGS: Lower chest: Trace atelectasis or scarring noted dependent left lung base. Hepatobiliary: No suspicious focal abnormality within the liver parenchyma. 4.0 x 3.4 x 2.5 cm complex fluid collection is identified in the gallbladder fossa containing a small gas bubble (axial 33/2 and coronal 100/7). The tip of a surgical drain is positioned in the posterior gallbladder fossa adjacent to the surgical clips coursing along the posteroinferior liver margin and out the right anterior abdominal wall. Pancreas: No focal mass lesion. No dilatation of the main duct. No intraparenchymal cyst. No peripancreatic edema. Spleen: No splenomegaly. No suspicious focal mass lesion. Adrenals/Urinary Tract: No adrenal nodule or mass. Kidneys unremarkable. No evidence for hydroureter. The urinary bladder appears normal for the degree of distention. Stomach/Bowel: Stomach is unremarkable. No gastric wall thickening. No evidence of outlet obstruction. Duodenum is normally positioned as is the ligament of  Treitz. No small bowel wall thickening. No small bowel dilatation. The terminal ileum is normal. The appendix is normal. There is some wall thickening in the splenic flexure of the colon as it courses under the liver. Edema/inflammation is seen around the splenic flexure of the colon and in the right omentum with fluid and edema around the right liver and tracking towards Morison's pouch. Left colon unremarkable. Vascular/Lymphatic: There is moderate atherosclerotic calcification of the abdominal aorta without aneurysm. 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 There is no gastrohepatic or hepatoduodenal ligament lymphadenopathy. No retroperitoneal or mesenteric lymphadenopathy. No pelvic sidewall lymphadenopathy. Reproductive: Prostate gland is heterogeneous. Other: Small volume free fluid is seen in the pelvis. Extraluminal gas is seen in the midline anterior pelvis and anterior left lower quadrant towards the left groin region. This gas may be extraperitoneal in the preperitoneal fat. Musculoskeletal: No worrisome lytic or sclerotic osseous abnormality. IMPRESSION: 1. 4.0 x 3.4 x 2.5 cm complex fluid collection in the gallbladder fossa containing a small gas bubble. Gas from surgery would not be expected after about 7-10 days. Gas could also be due to the presence of the surgical drain, but infection/evolving abscess  in the gallbladder fossa a distinct concern. 2. Edema/inflammation around the splenic flexure of the colon and in the right omentum with fluid and edema around the right liver and tracking towards Morison's pouch. Given the recent surgery and findings in the gallbladder fossa, this is probably secondary inflammatory change. Focal colitis with edema spreading up under the liver is considered less likely 3. Extraluminal gas in the midline anterior pelvis and anterior left lower quadrant towards the left groin region. Location of  this small volume gas suggests that it is extraperitoneal, in the preperitoneal space. Again, while gas after surgery is not typical after about 7-10 days, lack of free gas in the peritoneal cavity makes hollow viscus perforation unlikely. Nevertheless, close clinical follow-up recommended. 4. Small volume free fluid in the pelvis. 5. 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 6.  Aortic Atherosclerosis (ICD10-I70.0). Electronically Signed   By: Kennith Center M.D.   On: 07/20/2023 16:54   DG Chest Port 1 View  Result Date: 07/20/2023 CLINICAL DATA:  Sepsis. Cancer patient. Recent surgery. Known lung mass. EXAM: PORTABLE CHEST 1 VIEW COMPARISON:  X-ray 07/04/2023 and older.  CT angiogram 07/11/2023 FINDINGS: Slight elevation of the left hemidiaphragm. There is some patchy left perihilar opacity with some volume loss which is stable. Fiduciary marker in the peripheral right lung with a spiculated nodule, similar as well. No consolidation, pneumothorax or effusion which is new. Chronic lung changes. Dense likely calcified right apical nodules. Normal cardiopericardial silhouette. Film is rotated to the left. Scattered degenerative changes and osteopenia. Overlapping cardiac leads. IMPRESSION: Stable appearance of the thorax. Fiduciary marker in the right lung with a focal spiculated nodule is stable. Stable bandlike opacity left perihilar with volume loss. Electronically Signed   By: Karen Kays M.D.   On: 07/20/2023 15:40   _______________________________________________________________________________________________________ Latest  Blood pressure (!) 122/92, pulse (!) 105, temperature 100.3 F (37.9 C), temperature source Oral, resp. rate (!) 25, height 6' (1.829 m), weight 74.8 kg, SpO2 96%.   Vitals  labs and radiology finding personally reviewed  Review of Systems:    Pertinent positives include:  Fevers, chills,  fatigue  Constitutional:  No weight loss, night sweats, , weight loss  HEENT:  No headaches, Difficulty swallowing,Tooth/dental problems,Sore throat,  No sneezing, itching, ear ache, nasal congestion, post nasal drip,  Cardio-vascular:  No chest pain, Orthopnea, PND, anasarca, dizziness, palpitations.no Bilateral lower extremity swelling  GI:  No heartburn, indigestion, abdominal pain, nausea, vomiting, diarrhea, change in bowel habits, loss of appetite, melena, blood in stool, hematemesis Resp:  no shortness of breath at rest. No dyspnea on exertion, No excess mucus, no productive cough, No non-productive cough, No coughing up of blood.No change in color of mucus.No wheezing. Skin:  no rash or lesions. No jaundice GU:  no dysuria, change in color of urine, no urgency or frequency. No straining to urinate.  No flank pain.  Musculoskeletal:  No joint pain or no joint swelling. No decreased range of motion. No back pain.  Psych:  No change in mood or affect. No depression or anxiety. No memory loss.  Neuro: no localizing neurological complaints, no tingling, no weakness, no double vision, no gait abnormality, no slurred speech, no confusion  All systems reviewed and apart from HOPI all are negative _______________________________________________________________________________________________ Past Medical History:   Past Medical History:  Diagnosis Date   Asthma    Atypical chest pain 08/13/2022   Community acquired pneumonia  09/14/2022   COPD (chronic obstructive pulmonary disease) (HCC)    Essential hypertension 08/19/2021   GERD (gastroesophageal reflux disease)    HAP (hospital-acquired pneumonia) 09/16/2022   History of tracheostomy    03/09/22-04/11/22   HLD (hyperlipidemia)    Hypertension    Hypokalemia 08/13/2022   Hypomagnesemia 08/13/2022   Lung cancer (HCC)    PAD (peripheral artery disease) (HCC)    Seizures (HCC) 06/02/2022   Sepsis (HCC) 08/13/2022   Sepsis  due to pneumonia (HCC) 04/13/2023   Stroke (HCC) 02/2022      Past Surgical History:  Procedure Laterality Date   BRONCHIAL BIOPSY  05/30/2022   Procedure: BRONCHIAL BIOPSIES;  Surgeon: Leslye Peer, MD;  Location: [facility] ENDOSCOPY;  Service: Pulmonary;;   BRONCHIAL BRUSHINGS  05/30/2022   Procedure: BRONCHIAL BRUSHINGS;  Surgeon: Leslye Peer, MD;  Location: [facility] ENDOSCOPY;  Service: Pulmonary;;   BRONCHIAL NEEDLE ASPIRATION BIOPSY  05/30/2022   Procedure: BRONCHIAL NEEDLE ASPIRATION BIOPSIES;  Surgeon: Leslye Peer, MD;  Location: [facility] ENDOSCOPY;  Service: Pulmonary;;   BRONCHIAL WASHINGS  05/30/2022   Procedure: BRONCHIAL WASHINGS;  Surgeon: Leslye Peer, MD;  Location: [facility] ENDOSCOPY;  Service: Pulmonary;;   CHOLECYSTECTOMY N/A 07/05/2023   Procedure: LAPAROSCOPIC CHOLECYSTECTOMY WITH INTRAOPERATIVE CHOLANGIOGRAM;  Surgeon: Karie Soda, MD;  Location: [facility] ORS;  Service: General;  Laterality: N/A;   ERCP N/A 07/06/2023   Procedure: ENDOSCOPIC RETROGRADE CHOLANGIOPANCREATOGRAPHY (ERCP);  Surgeon: Vida Rigger, MD;  Location: [facility] ENDOSCOPY;  Service: Gastroenterology;  Laterality: N/A;   ESOPHAGOGASTRODUODENOSCOPY (EGD) WITH PROPOFOL N/A 03/11/2022   Procedure: ESOPHAGOGASTRODUODENOSCOPY (EGD) WITH PROPOFOL;  Surgeon: Violeta Gelinas, MD;  Location: [facility] ENDOSCOPY;  Service: General;  Laterality: N/A;   FIDUCIAL MARKER PLACEMENT  05/30/2022   Procedure: FIDUCIAL MARKER PLACEMENT;  Surgeon: Leslye Peer, MD;  Location: [facility] ENDOSCOPY;  Service: Pulmonary;;   IR ANGIO INTRA EXTRACRAN SEL COM CAROTID INNOMINATE UNI R MOD SED  03/02/2022   IR CT HEAD LTD  03/02/2022   IR PERCUTANEOUS ART THROMBECTOMY/INFUSION INTRACRANIAL INC DIAG ANGIO  03/02/2022   PEG PLACEMENT N/A 03/11/2022   Procedure: PERCUTANEOUS ENDOSCOPIC GASTROSTOMY (PEG) PLACEMENT;  Surgeon: Violeta Gelinas, MD;  Location: [facility] ENDOSCOPY;  Service: General;  Laterality: N/A;   RADIOLOGY WITH ANESTHESIA N/A 03/02/2022   Procedure: IR WITH  ANESTHESIA;  Surgeon: Julieanne Cotton, MD;  Location: [facility] OR;  Service: Radiology;  Laterality: N/A;   REMOVAL OF STONES  07/06/2023   Procedure: REMOVAL OF STONES;  Surgeon: Vida Rigger, MD;  Location: [facility] ENDOSCOPY;  Service: Gastroenterology;;   Dennison Mascot  07/06/2023   Procedure: Dennison Mascot;  Surgeon: Vida Rigger, MD;  Location: [facility] ENDOSCOPY;  Service: Gastroenterology;;   VIDEO BRONCHOSCOPY WITH RADIAL ENDOBRONCHIAL ULTRASOUND  05/30/2022   Procedure: VIDEO BRONCHOSCOPY WITH RADIAL ENDOBRONCHIAL ULTRASOUND;  Surgeon: Leslye Peer, MD;  Location: [facility] ENDOSCOPY;  Service: Pulmonary;;    Social History:  Ambulatory    wheelchair bound,      reports that he quit smoking about 14 months ago. His smoking use included cigarettes. He has never been exposed to tobacco smoke. He has never used smokeless tobacco. He reports that he does not currently use alcohol. He reports that he does not use drugs.     Family History:   Family History  Problem Relation Age of Onset   Throat cancer Mother    Liver cancer Father    Kidney failure Sister    Cancer - Lung Paternal Uncle    ______________________________________________________________________________________________ Allergies: No Known Allergies  Prior to Admission medications   Medication Sig Start Date End Date Taking? Authorizing Provider  albuterol (PROVENTIL) (2.5 MG/3ML) 0.083% nebulizer solution Take 3 mLs (2.5 mg total) by nebulization every 4 (four) hours as needed for wheezing or shortness of breath. Patient not taking: Reported on 04/13/2023 08/16/22   Steffanie Rainwater, MD  apixaban (ELIQUIS) 5 MG TABS tablet TAKE 1 TABLET(5 MG) BY MOUTH TWICE DAILY Patient taking differently: Take 5 mg by mouth 2 (two) times daily. 04/11/23   Merrilyn Puma, MD  atorvastatin (LIPITOR) 40 MG tablet Take 1 tablet (40 mg total) by mouth daily. 01/03/23 01/03/24  Merrilyn Puma, MD  budesonide (PULMICORT) 0.5 MG/2ML nebulizer solution  NEW PRESCRIPTION REQEUST: BUDESONIDE 0.5 MG/ - USE ONE VIAL TWICE DAILY Patient taking differently: Take 0.5 mg by nebulization in the morning and at bedtime. 11/29/22   Martina Sinner, MD  folic acid (FOLVITE) 1 MG tablet Take 1 tablet (1 mg total) by mouth daily. Patient not taking: Reported on 06/03/2023 03/21/23   Merrilyn Puma, MD  levETIRAcetam (KEPPRA) 500 MG tablet Take 1 tablet (500 mg total) by mouth 2 (two) times daily. 12/07/22   Ihor Austin, NP  midodrine (PROAMATINE) 2.5 MG tablet Take 1 tablet (2.5 mg total) by mouth 2 (two) times daily with a meal. 07/09/23 08/08/23  Pahwani, Kasandra Knudsen, MD  Multiple Vitamin (MULTIVITAMIN WITH MINERALS) TABS tablet Take 1 tablet by mouth daily. Patient taking differently: Take 1 tablet by mouth in the morning and at bedtime. 09/20/22   Doran Stabler, DO  pantoprazole (PROTONIX) 40 MG tablet Take 1 tablet (40 mg total) by mouth daily. 07/10/23 08/09/23  Pahwani, Kasandra Knudsen, MD  sucralfate (CARAFATE) 1 GM/10ML suspension Take 10 mLs (1 g total) by mouth 4 (four) times daily -  with meals and at bedtime. Patient not taking: Reported on 07/04/2023 07/02/23   Nira Conn, MD  Tiotropium Bromide-Olodaterol (STIOLTO RESPIMAT) 2.5-2.5 MCG/ACT AERS NEW PRESCRIPTION REQUEST: STIOLTO 2.5 MCG- INHALE TWO PUFFS BY MOUTH DAILY Patient taking differently: Inhale 2 each into the lungs daily. 11/29/22   Martina Sinner, MD    ___________________________________________________________________________________________________ Physical Exam:    07/20/2023    6:00 PM 07/20/2023    4:15 PM 07/20/2023    3:11 PM  Vitals with BMI  Height   6\' 0"   Weight   165 lbs  BMI   22.37  Systolic 122 112   Diastolic 92 70   Pulse 105 105      1. General:  in No  Acute distress    Chronically ill   -appearing 2. Psychological: Alert  aphasic 3. Head/ENT:    Dry Mucous Membranes                          Head Non traumatic, neck supple                            Poor Dentition 4. SKIN: decreased Skin turgor,  Skin clean Dry and intact no rash    5. Heart: Regular rate and rhythm no  Murmur, no Rub or gallop 6. Lungs , no wheezes or crackles   7. Abdomen: Soft,  non-tender, Non distended bowel sounds decreased JP drain in place 8. Lower extremities: no clubbing, cyanosis, no  edema 9. Neurologically not cooperative 10. MSK: Normal range of motion    Chart has been reviewed  ______________________________________________________________________________________________  Assessment/Plan  62 y.o. 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)  Intra-abdominal fluid collection  AKI (acute kidney injury) (HCC)  Hypomagnesemia     Septic shock (HCC)  -SIRS criteria met with  elevated white blood cell count,       Component Value Date/Time   WBC 32.4 (H) 07/20/2023 1517   LYMPHSABS 1.1 07/20/2023 1517   LYMPHSABS 0.6 (L) 09/09/2022 0908     tachycardia    fever   RR >20 Today's Vitals   07/20/23 1511 07/20/23 1615 07/20/23 1800 07/20/23 1912  BP:  112/70 (!) 122/92   Pulse:  (!) 105 (!) 105   Resp:  (!) 23 (!) 25   Temp:    (!) 100.5 F (38.1 C)  TempSrc:    Axillary  SpO2:  96% 96%   Weight: 74.8 kg     Height: 6' (1.829 m)     PainSc: 10-Worst pain ever      Body mass index is 22.38 kg/m.  This patient meets SIRS Criteria and may be septic.      The recent clinical data is shown below. Vitals:   07/20/23 1511 07/20/23 1615 07/20/23 1800 07/20/23 1912  BP:  112/70 (!) 122/92   Pulse:  (!)  105 (!) 105   Resp:  (!) 23 (!) 25   Temp:    (!) 100.5 F (38.1 C)  TempSrc:    Axillary  SpO2:  96% 96%   Weight: 74.8 kg     Height: 6' (1.829 m)        -Most likely source being:  intra-abdominal,      Patient meeting criteria for Severe sepsis with    evidence of end organ damage/organ dysfunction such as   elevated lactic acid >2     Component Value Date/Time   LATICACIDVEN 5.0 (HH) 07/20/2023 1802    acute metabolic encephalopathy     Patient is meeting criteria for SEPTIC SHOCK with  lactic acid > 4 or multiple SBP readings below 90 mmhg or MAP reading below 65 mmhg   - Obtain serial lactic acid and procalcitonin level.  - Initiated IV antibiotics in ER: Antibiotics Given (last 72 hours)     Date/Time Action Medication Dose Rate   07/20/23 1538 New Bag/Given   metroNIDAZOLE (FLAGYL) IVPB 500 mg 500 mg 100 mL/hr   07/20/23 1541 New Bag/Given   ceFEPIme (MAXIPIME) 2 g in sodium chloride 0.9 % 100 mL IVPB 2 g 200 mL/hr   07/20/23 1627 New Bag/Given   vancomycin (VANCOREADY) IVPB 1500 mg/300 mL 1,500 mg 150 mL/hr       Will continue  on : Cefepime Vanco and Flagyl   - await results of blood and urine culture  - Rehydrate aggressively  Intravenous fluids were administered         30cc/kg fluid Recommended ER consult PCCM  7:16 PM   Malnutrition of moderate degree Will need nutritional consult  Squamous cell lung cancer (HCC) Continue to follow-up with oncology Dr. Arbutus Ped Will email to the patient is being admitted  Dyslipidemia Hold Lipitor  while patient n.p.o.  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 Arbutus Ped to let them know patient has been admitted  Aphasia as late effect of cerebrovascular accident Chronic secondary to prior stroke  Seizure [facility]) Given concerns of ongoing focal seizures will order 2000 mg  Keppra loading dose  EEG  Seizure precautions Continue Keppra 500 twice daily but changed to IV dosing  CT head  If no improvement after IV Keppra load may need to be transferred to [facility]    for spell capture and LTM EEG.   Intra-abdominal fluid collection Appreciate general surgery consult At this point they state no indication for surgery Drain is in place and functional They will place a consult for IR Continue n.p.o. hold Eliquis continue IV antibiotics and fluid resuscitation  AKI (acute kidney injury) (HCC) Will rehydrate for your electrolytes  Other plan as per orders.  DVT prophylaxis:  SCD    Code Status: DNR/DNI  as per family I had personally discussed CODE STATUS with  family ACP none   Family Communication:   Family not at  Bedside  plan of care was discussed with cousin  Diet  Diet Orders (From admission, onward)     Start     Ordered   07/20/23 1516  Diet NPO time specified  (Septic presentation on arrival (screening labs, nursing and treatment orders for obvious sepsis))  Diet effective now        07/20/23 1516            Disposition Plan:     likely will need placement for rehabilitation                              Following barriers for discharge:                            Electrolytes corrected                               Anemia corrected                             Pain controlled with PO medications                               Afebrile, white count improving able to transition to PO antibiotics                            Will need consultants to evaluate patient prior to discharge         Consult Orders  (From admission, onward)           Start     Ordered   07/20/23 1749  Consult to hospitalist  Once       Provider:  (Not yet assigned)  Question Answer Comment  Place call to: [facility]   Reason for Consult Admit      07/20/23 1748  Transition of care consulted                    Nutrition    consulted                                       Consults called: General surgery aware Treatment Team:  Ccs, Md, MD  PCCM is aware  If seizure episodes persist will need official neurology consult Admission status:  ED Disposition     ED Disposition  Admit   Condition  --   Comment  Hospital Area: [facility] [[id number]]  Level of Care: Stepdown [14]  Admit to SDU based on following criteria: Hemodynamic compromise or significant risk of instability:  Patient requiring short term acute titration and management of vasoactive drips, and invasive monitoring (i.e., CVP and Arterial line).  May admit patient to [facility] or [facility] if equivalent level of care is available:: No  Covid Evaluation: Asymptomatic - no recent exposure (last 10 days) testing not required  Diagnosis: Septic shock [facility]) [[id number]]  Admitting Physician: Therisa Doyne [[id number]]  Attending Physician: Therisa Doyne [[id number]]  Certification:: I certify this patient will need inpatient services for at least 2 midnights  Estimated Length of Stay: 2            inpatient     I Expect 2 midnight stay secondary to severity of patient's current illness need for inpatient interventions justified by the following:  hemodynamic instability despite optimal treatment (tachycardia  hypotension tachypnea   )  Severe lab/radiological/exam abnormalities including:   Sepsis with fluid collection and extensive comorbidities including:   CHF   COPD/asthma  history of stroke with residual deficits    malignancy,   of amputation Chronic anticoagulation  That are currently affecting medical management.   I expect  patient to be hospitalized for 2 midnights requiring inpatient medical care.  Patient is at high risk for adverse outcome (such as loss of life or disability) if not treated.  Indication for inpatient stay as follows:  Severe change from baseline regarding mental  status Hemodynamic instability despite maximal medical therapy,     inability to maintain oral hydration   Need for operative/procedural  intervention     Need for IV antibiotics, IV fluids,      Level of care stepdown tele indefinitely please discontinue once patient no longer qualifies COVID-19 Labs    Lab Results  Component Value Date   SARSCOV2NAA NEGATIVE 07/20/2023     Precautions: admitted as   Covid Negative   Critical   Patient is critically ill due to  hemodynamic instability  severe sepsis  They are at high risk for life/limb threatening clinical deterioration requiring frequent reassessment and modifications of care.  Services provided include examination of the patient, review of relevant ancillary tests, prescription of lifesaving therapies, review of medications and prophylactic therapy.  Total critical care time excluding separately billable procedures: 60*  Minutes.    Leone Putman 07/20/2023, 10:00 PM   [facility]     after 2 AM please page floor coverage PA If 7AM-7PM, please contact the day team taking care of the patient using [email or web address]

## 2023-07-20 NOTE — Assessment & Plan Note (Addendum)
Given concerns of ongoing focal seizures will order 2000 mg Keppra loading dose  EEG  Seizure precautions Continue Keppra 500 twice daily but changed to IV dosing  CT head  If no improvement after IV Keppra load may need to be transferred to [facility]    for spell capture and LTM EEG.  Discussed with [facility] will transfer to [facility] when bed is available Seizure precautions

## 2023-07-20 NOTE — Progress Notes (Signed)
Pharmacy Antibiotic Note  Todd Estrada is a 62 y.o. male admitted on 07/20/2023 with sepsis.  History of recent abdominal surgery on 7/17 and 7/18.  Flagyl 500 mg IV every 12 hours ordered per provider.  One time doses of cefepime and vancomycin initiated in ED.  On admission, Pharmacy has been consulted for cefepime and vancomycin dosing for 7 days.  Plan: Continue cefepime 2 g IV every 8 hours Continue vancomycin 1500 mg IV every 24 hours (Goal AUC 400-550, eAUC 466.3, SCr used: 1.2) Monitor clinical progress, renal function, vancomycin levels as indicated F/U C&S, abx deescalation   Height: 6' (182.9 cm) Weight: 74.8 kg (165 lb) IBW/kg (Calculated) : 77.6  Temp (24hrs), Avg:100.4 F (38 C), Min:100.3 F (37.9 C), Max:100.5 F (38.1 C)  Recent Labs  Lab 07/20/23 1517 07/20/23 1537 07/20/23 1538 07/20/23 1802  WBC 32.4*  --   --   --   CREATININE 1.32*  --  1.20  --   LATICACIDVEN  --  6.2*  --  5.0*    Estimated Creatinine Clearance: 66.7 mL/min (by C-G formula based on SCr of 1.2 mg/dL).    No Known Allergies  Antimicrobials this admission: 8/1 cefepime and Flagyl >>  8/1 vancomycin >>   Microbiology results: 8/1 BCx: pending 8/1 resp panel: neg   Thank you for allowing pharmacy to be a part of this patient's care.  Selinda Eon, PharmD, BCPS Clinical Pharmacist [facility] 07/20/2023 7:35 PM

## 2023-07-20 NOTE — Assessment & Plan Note (Signed)
Continue to follow-up with oncology Dr. Arbutus Ped Will email to the patient is being admitted

## 2023-07-20 NOTE — Assessment & Plan Note (Signed)
Eliquis on hold for tonight given need for IR intervention

## 2023-07-20 NOTE — ED Triage Notes (Signed)
Cancer pt, recent abd surgery, son brought pt and stated that he is weaker than normal. Pt unable to speak in complete sentences.

## 2023-07-20 NOTE — Assessment & Plan Note (Signed)
Will rehydrate for your electrolytes

## 2023-07-21 ENCOUNTER — Inpatient Hospital Stay (HOSPITAL_COMMUNITY): Payer: Medicaid Other

## 2023-07-21 ENCOUNTER — Encounter (HOSPITAL_COMMUNITY): Payer: Self-pay | Admitting: Internal Medicine

## 2023-07-21 DIAGNOSIS — I6930 Unspecified sequelae of cerebral infarction: Secondary | ICD-10-CM

## 2023-07-21 DIAGNOSIS — A419 Sepsis, unspecified organism: Secondary | ICD-10-CM

## 2023-07-21 DIAGNOSIS — R6521 Severe sepsis with septic shock: Secondary | ICD-10-CM

## 2023-07-21 LAB — MAGNESIUM: Magnesium: 2 mg/dL (ref 1.7–2.4)

## 2023-07-21 LAB — I-STAT CG4 LACTIC ACID, ED: Lactic Acid, Venous: 1.4 mmol/L (ref 0.5–1.9)

## 2023-07-21 LAB — PHOSPHORUS: Phosphorus: 3.4 mg/dL (ref 2.5–4.6)

## 2023-07-21 LAB — GLUCOSE, CAPILLARY: Glucose-Capillary: 106 mg/dL — ABNORMAL HIGH (ref 70–99)

## 2023-07-21 MED ORDER — ACETAMINOPHEN 325 MG PO TABS
650.0000 mg | ORAL_TABLET | Freq: Four times a day (QID) | ORAL | Status: DC | PRN
  Administered 2023-07-23 – 2023-07-24 (×2): 650 mg via ORAL
  Filled 2023-07-21 (×2): qty 2

## 2023-07-21 MED ORDER — ACETAMINOPHEN 650 MG RE SUPP: 650.0000 mg | Freq: Four times a day (QID) | RECTAL | Status: DC | PRN

## 2023-07-21 MED ORDER — VANCOMYCIN HCL IN DEXTROSE 1-5 GM/200ML-% IV SOLN
1000.0000 mg | Freq: Two times a day (BID) | INTRAVENOUS | Status: DC
  Administered 2023-07-21 – 2023-07-23 (×4): 1000 mg via INTRAVENOUS
  Filled 2023-07-21 (×4): qty 200

## 2023-07-21 MED ORDER — SODIUM CHLORIDE 0.9 % IV SOLN: INTRAVENOUS | Status: DC

## 2023-07-21 MED ORDER — PANTOPRAZOLE SODIUM 40 MG PO TBEC
40.0000 mg | DELAYED_RELEASE_TABLET | Freq: Every day | ORAL | Status: DC
  Administered 2023-07-22 – 2023-07-25 (×4): 40 mg via ORAL
  Filled 2023-07-21 (×4): qty 1

## 2023-07-21 MED ORDER — MIDODRINE HCL 5 MG PO TABS
5.0000 mg | ORAL_TABLET | Freq: Two times a day (BID) | ORAL | Status: DC
  Administered 2023-07-22 – 2023-07-25 (×7): 5 mg via ORAL
  Filled 2023-07-21 (×7): qty 1

## 2023-07-21 MED ORDER — ONDANSETRON HCL 4 MG/2ML IJ SOLN: 4.0000 mg | Freq: Four times a day (QID) | INTRAMUSCULAR | Status: DC | PRN

## 2023-07-21 MED ORDER — FENTANYL CITRATE PF 50 MCG/ML IJ SOSY
12.5000 ug | PREFILLED_SYRINGE | INTRAMUSCULAR | Status: DC | PRN
  Administered 2023-07-21: 12.5 ug via INTRAVENOUS
  Administered 2023-07-24: 50 ug via INTRAVENOUS
  Filled 2023-07-21 (×2): qty 1

## 2023-07-21 MED ORDER — ONDANSETRON HCL 4 MG PO TABS: 4.0000 mg | ORAL_TABLET | Freq: Four times a day (QID) | ORAL | Status: DC | PRN

## 2023-07-21 MED ORDER — ATORVASTATIN CALCIUM 40 MG PO TABS
40.0000 mg | ORAL_TABLET | Freq: Every day | ORAL | Status: DC
  Administered 2023-07-22 – 2023-07-25 (×4): 40 mg via ORAL
  Filled 2023-07-21 (×4): qty 1

## 2023-07-21 NOTE — Consult Note (Cosign Needed Addendum)
Chief Complaint: Patient was seen in consultation today for gall bladder fossa abscess  Referring Physician(s): Hosie Spangle, PA-C  Supervising Physician: Malachy Moan  Patient Status: [facility] - In-pt  History of Present Illness: KELSON QUEENAN is a 62 y.o. male with history of CVA with residual aphasia, , HTN, COPD, non small cell lung cancer (RUL).  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  He was ultimately discharged with surgical drain in place but readmitted at [facility] on 07/20/23 with progressive weakness and fevers CT Abdomen Pelvis showed:  4.0 x 3.4 x 2.5 cm complex fluid collection in the gallbladder fossa containing a small gas bubble. Gas from surgery would not be expected after about 7-10 days. Gas could also be due to the presence of the surgical drain, but infection/evolving abscess in the gallbladder fossa a distinct concern.   Team is requesting an intra abdominal abscess drain placement for infection control. Patient also with concern for seizures and has been sent to [facility] for Neurology services.   Case reviewed by Dr. Elby Showers prior to transfer to [facility] who notes the collection is small but drainage could be attempted.  Patient then transferred and case reviewed with potential performing MDs Dr. Archer Asa and Dr. Bryn Gulling who feel risk greater than potential benefit of draining the currently very small collection.    Past Medical History:  Diagnosis Date   Asthma    Atypical chest pain 08/13/2022   Community acquired pneumonia 09/14/2022   COPD (chronic obstructive pulmonary disease) (HCC)    Essential hypertension 08/19/2021   GERD (gastroesophageal reflux disease)    HAP (hospital-acquired pneumonia) 09/16/2022   History of tracheostomy    03/09/22-04/11/22   HLD (hyperlipidemia)    Hypertension    Hypokalemia 08/13/2022   Hypomagnesemia 08/13/2022   Lung cancer (HCC)    PAD (peripheral artery disease) (HCC)     Seizures (HCC) 06/02/2022   Sepsis (HCC) 08/13/2022   Sepsis due to pneumonia (HCC) 04/13/2023   Stroke (HCC) 02/2022    Past Surgical History:  Procedure Laterality Date   BRONCHIAL BIOPSY  05/30/2022   Procedure: BRONCHIAL BIOPSIES;  Surgeon: Leslye Peer, MD;  Location: [facility] ENDOSCOPY;  Service: Pulmonary;;   BRONCHIAL BRUSHINGS  05/30/2022   Procedure: BRONCHIAL BRUSHINGS;  Surgeon: Leslye Peer, MD;  Location: [facility] ENDOSCOPY;  Service: Pulmonary;;   BRONCHIAL NEEDLE ASPIRATION BIOPSY  05/30/2022   Procedure: BRONCHIAL NEEDLE ASPIRATION BIOPSIES;  Surgeon: Leslye Peer, MD;  Location: [facility] ENDOSCOPY;  Service: Pulmonary;;   BRONCHIAL WASHINGS  05/30/2022   Procedure: BRONCHIAL WASHINGS;  Surgeon: Leslye Peer, MD;  Location: [facility] ENDOSCOPY;  Service: Pulmonary;;   CHOLECYSTECTOMY N/A 07/05/2023   Procedure: LAPAROSCOPIC CHOLECYSTECTOMY WITH INTRAOPERATIVE CHOLANGIOGRAM;  Surgeon: Karie Soda, MD;  Location: [facility] ORS;  Service: General;  Laterality: N/A;   ERCP N/A 07/06/2023   Procedure: ENDOSCOPIC RETROGRADE CHOLANGIOPANCREATOGRAPHY (ERCP);  Surgeon: Vida Rigger, MD;  Location: [facility] ENDOSCOPY;  Service: Gastroenterology;  Laterality: N/A;   ESOPHAGOGASTRODUODENOSCOPY (EGD) WITH PROPOFOL N/A 03/11/2022   Procedure: ESOPHAGOGASTRODUODENOSCOPY (EGD) WITH PROPOFOL;  Surgeon: Violeta Gelinas, MD;  Location: [facility] ENDOSCOPY;  Service: General;  Laterality: N/A;   FIDUCIAL MARKER PLACEMENT  05/30/2022   Procedure: FIDUCIAL MARKER PLACEMENT;  Surgeon: Leslye Peer, MD;  Location: [facility] ENDOSCOPY;  Service: Pulmonary;;   IR ANGIO INTRA EXTRACRAN SEL COM CAROTID INNOMINATE UNI R MOD SED  03/02/2022   IR CT HEAD LTD  03/02/2022   IR PERCUTANEOUS  ART THROMBECTOMY/INFUSION INTRACRANIAL INC DIAG ANGIO  03/02/2022   PEG PLACEMENT N/A 03/11/2022   Procedure: PERCUTANEOUS ENDOSCOPIC GASTROSTOMY (PEG) PLACEMENT;  Surgeon: Violeta Gelinas, MD;  Location: [facility] ENDOSCOPY;  Service: General;  Laterality: N/A;    RADIOLOGY WITH ANESTHESIA N/A 03/02/2022   Procedure: IR WITH ANESTHESIA;  Surgeon: Julieanne Cotton, MD;  Location: [facility] OR;  Service: Radiology;  Laterality: N/A;   REMOVAL OF STONES  07/06/2023   Procedure: REMOVAL OF STONES;  Surgeon: Vida Rigger, MD;  Location: [facility] ENDOSCOPY;  Service: Gastroenterology;;   Dennison Mascot  07/06/2023   Procedure: Dennison Mascot;  Surgeon: Vida Rigger, MD;  Location: [facility] ENDOSCOPY;  Service: Gastroenterology;;   VIDEO BRONCHOSCOPY WITH RADIAL ENDOBRONCHIAL ULTRASOUND  05/30/2022   Procedure: VIDEO BRONCHOSCOPY WITH RADIAL ENDOBRONCHIAL ULTRASOUND;  Surgeon: Leslye Peer, MD;  Location: [facility] ENDOSCOPY;  Service: Pulmonary;;    Allergies: Patient has no known allergies.  Medications: Prior to Admission medications   Medication Sig Start Date End Date Taking? Authorizing Provider  albuterol (PROVENTIL) (2.5 MG/3ML) 0.083% nebulizer solution Take 3 mLs (2.5 mg total) by nebulization every 4 (four) hours as needed for wheezing or shortness of breath. 08/16/22  Yes Amponsah, Flossie Buffy, MD  apixaban (ELIQUIS) 5 MG TABS tablet TAKE 1 TABLET(5 MG) BY MOUTH TWICE DAILY Patient taking differently: Take 5 mg by mouth 2 (two) times daily. 04/11/23  Yes Merrilyn Puma, MD  atorvastatin (LIPITOR) 40 MG tablet Take 1 tablet (40 mg total) by mouth daily. 01/03/23 01/03/24 Yes Merrilyn Puma, MD  levETIRAcetam (KEPPRA) 500 MG tablet Take 1 tablet (500 mg total) by mouth 2 (two) times daily. 12/07/22  Yes McCue, Shanda Bumps, NP  midodrine (PROAMATINE) 2.5 MG tablet Take 1 tablet (2.5 mg total) by mouth 2 (two) times daily with a meal. 07/09/23 08/08/23 Yes Pahwani, Rinka R, MD  pantoprazole (PROTONIX) 40 MG tablet Take 1 tablet (40 mg total) by mouth daily. 07/10/23 08/09/23 Yes Pahwani, Rinka R, MD  Tiotropium Bromide-Olodaterol (STIOLTO RESPIMAT) 2.5-2.5 MCG/ACT AERS NEW PRESCRIPTION REQUEST: STIOLTO 2.5 MCG- INHALE TWO PUFFS BY MOUTH DAILY Patient taking differently: Inhale 2 puffs into  the lungs daily. 11/29/22  Yes Martina Sinner, MD  budesonide (PULMICORT) 0.5 MG/2ML nebulizer solution NEW PRESCRIPTION REQEUST: BUDESONIDE 0.5 MG/ - USE ONE VIAL TWICE DAILY Patient not taking: Reported on 07/21/2023 11/29/22   Martina Sinner, MD  sucralfate (CARAFATE) 1 GM/10ML suspension Take 10 mLs (1 g total) by mouth 4 (four) times daily -  with meals and at bedtime. Patient not taking: Reported on 07/04/2023 07/02/23   Cardama, Amadeo Garnet, MD     Family History  Problem Relation Age of Onset   Throat cancer Mother    Liver cancer Father    Kidney failure Sister    Cancer - Lung Paternal Uncle     Social History   Socioeconomic History   Marital status: Widowed    Spouse name: Not on file   Number of children: Not on file   Years of education: Not on file   Highest education level: Not on file  Occupational History   Not on file  Tobacco Use   Smoking status: Former    Current packs/day: 0.00    Types: Cigarettes    Quit date: 05/02/2022    Years since quitting: 1.2    Passive exposure: Never   Smokeless tobacco: Never  Vaping Use   Vaping status: Never Used  Substance and Sexual Activity   Alcohol use: Not Currently   Drug use:  No   Sexual activity: Not on file  Other Topics Concern   Not on file  Social History Narrative   Not on file   Social Determinants of Health   Financial Resource Strain: Not on file  Food Insecurity: No Food Insecurity (07/05/2023)   Hunger Vital Sign    Worried About Running Out of Food in the Last Year: Never true    Ran Out of Food in the Last Year: Never true  Transportation Needs: No Transportation Needs (07/05/2023)   PRAPARE - Administrator, Civil Service (Medical): No    Lack of Transportation (Non-Medical): No  Physical Activity: Not on file  Stress: Not on file  Social Connections: Socially Isolated (01/06/2023)   Social Connection and Isolation Panel [NHANES]    Frequency of Communication with  Friends and Family: Once a week    Frequency of Social Gatherings with Friends and Family: Once a week    Attends Religious Services: Never    Database administrator or Organizations: No    Attends Banker Meetings: Never    Marital Status: Widowed     Review of Systems: A 12 point ROS discussed and pertinent positives are indicated in the HPI above.  All other systems are negative.  Review of Systems  Unable to perform ROS: Patient nonverbal    Vital Signs: BP 115/73 (BP Location: Left Arm)   Pulse 82   Temp 99.1 F (37.3 C) (Oral)   Resp (!) 21   Ht 6' (1.829 m)   Wt 165 lb (74.8 kg)   SpO2 95%   BMI 22.38 kg/m   Physical Exam Vitals and nursing note reviewed.  Constitutional:      General: He is not in acute distress.    Appearance: Normal appearance. He is not ill-appearing.  HENT:     Mouth/Throat:     Mouth: Mucous membranes are moist.     Pharynx: Oropharynx is clear.  Cardiovascular:     Rate and Rhythm: Normal rate and regular rhythm.  Pulmonary:     Effort: Pulmonary effort is normal. No respiratory distress.     Breath sounds: Normal breath sounds.  Abdominal:     General: There is no distension.     Palpations: Abdomen is soft.     Comments: RLQ surgical drain in place with minimal output.  Neurological:     General: No focal deficit present.     Mental Status: He is alert and oriented to person, place, and time.  Psychiatric:        Mood and Affect: Mood normal.        Behavior: Behavior normal.        Thought Content: Thought content normal.        Judgment: Judgment normal.      MD Evaluation Airway: WNL Heart: WNL Abdomen: WNL Chest/ Lungs: WNL ASA  Classification: 3 Mallampati/Airway Score: Two   Imaging: CT HEAD WO CONTRAST ( )  Result Date: 07/21/2023 CLINICAL DATA:  Seizure, focal (Ped 0-17y) EXAM: CT HEAD WITHOUT CONTRAST TECHNIQUE: Contiguous axial images were obtained from the base of the skull through the  vertex without intravenous contrast. RADIATION DOSE REDUCTION: This exam was performed according to the departmental dose-optimization program which includes automated exposure control, adjustment of the mA and/or kV according to patient size and/or use of iterative reconstruction technique. COMPARISON:  CT head 07/04/2023 FINDINGS: Brain: Patchy and confluent areas of decreased attenuation are noted throughout the deep and  periventricular white matter of the cerebral hemispheres bilaterally, compatible with chronic microvascular ischemic disease. Similar-appearing chronic left MCA territory and left cerebellar infarctions. No evidence of large-territorial acute infarction. No parenchymal hemorrhage. No mass lesion. No extra-axial collection. No mass effect or midline shift. No hydrocephalus. Basilar cisterns are patent. Vascular: No hyperdense vessel. Skull: No acute fracture or focal lesion. Sinuses/Orbits: Almost complete opacification of the right maxillary sinus fluid CT of the sinuses of chronic infection. Otherwise paranasal sinuses and mastoid air cells are clear. The orbits are unremarkable. Other: None. IMPRESSION: No acute intracranial abnormality in a patient with similar-appearing chronic left MCA territory and left cerebellar infarctions. If high clinical suspicion for superimposed acute infarction, please consider MRI brain noncontrast for further evaluation. Electronically Signed   By: Tish Frederickson M.D.   On: 07/21/2023 03:41   CT ABDOMEN PELVIS W CONTRAST  Result Date: 07/20/2023 CLINICAL DATA:  Status post laparoscopic cholecystectomy for 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 EXAM: CT ABDOMEN AND PELVIS WITH CONTRAST TECHNIQUE: Multidetector CT imaging of the abdomen and pelvis was performed using the standard protocol following bolus administration of intravenous contrast. RADIATION DOSE REDUCTION: This exam was performed according to the departmental dose-optimization program  which includes automated exposure control, adjustment of the mA and/or kV according to patient size and/or use of iterative reconstruction technique. CONTRAST:  80mL OMNIPAQUE IOHEXOL 300 MG/ML  SOLN COMPARISON:  07/12/2023 FINDINGS: Lower chest: Trace atelectasis or scarring noted dependent left lung base. Hepatobiliary: No suspicious focal abnormality within the liver parenchyma. 4.0 x 3.4 x 2.5 cm complex fluid collection is identified in the gallbladder fossa containing a small gas bubble (axial 33/2 and coronal 100/7). The tip of a surgical drain is positioned in the posterior gallbladder fossa adjacent to the surgical clips coursing along the posteroinferior liver margin and out the right anterior abdominal wall. Pancreas: No focal mass lesion. No dilatation of the main duct. No intraparenchymal cyst. No peripancreatic edema. Spleen: No splenomegaly. No suspicious focal mass lesion. Adrenals/Urinary Tract: No adrenal nodule or mass. Kidneys unremarkable. No evidence for hydroureter. The urinary bladder appears normal for the degree of distention. Stomach/Bowel: Stomach is unremarkable. No gastric wall thickening. No evidence of outlet obstruction. Duodenum is normally positioned as is the ligament of Treitz. No small bowel wall thickening. No small bowel dilatation. The terminal ileum is normal. The appendix is normal. There is some wall thickening in the splenic flexure of the colon as it courses under the liver. Edema/inflammation is seen around the splenic flexure of the colon and in the right omentum with fluid and edema around the right liver and tracking towards Morison's pouch. Left colon unremarkable. Vascular/Lymphatic: There is moderate atherosclerotic calcification of the abdominal aorta without aneurysm. 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 There is no gastrohepatic or hepatoduodenal ligament lymphadenopathy. No  retroperitoneal or mesenteric lymphadenopathy. No pelvic sidewall lymphadenopathy. Reproductive: Prostate gland is heterogeneous. Other: Small volume free fluid is seen in the pelvis. Extraluminal gas is seen in the midline anterior pelvis and anterior left lower quadrant towards the left groin region. This gas may be extraperitoneal in the preperitoneal fat. Musculoskeletal: No worrisome lytic or sclerotic osseous abnormality. IMPRESSION: 1. 4.0 x 3.4 x 2.5 cm complex fluid collection in the gallbladder fossa containing a small gas bubble. Gas from surgery would not be expected after about 7-10 days. Gas could also be due to the presence of the surgical drain, but infection/evolving abscess  in the gallbladder fossa a distinct concern. 2. Edema/inflammation around the splenic flexure of the colon and in the right omentum with fluid and edema around the right liver and tracking towards Morison's pouch. Given the recent surgery and findings in the gallbladder fossa, this is probably secondary inflammatory change. Focal colitis with edema spreading up under the liver is considered less likely 3. Extraluminal gas in the midline anterior pelvis and anterior left lower quadrant towards the left groin region. Location of this small volume gas suggests that it is extraperitoneal, in the preperitoneal space. Again, while gas after surgery is not typical after about 7-10 days, lack of free gas in the peritoneal cavity makes hollow viscus perforation unlikely. Nevertheless, close clinical follow-up recommended. 4. Small volume free fluid in the pelvis. 5. 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 6.  Aortic Atherosclerosis (ICD10-I70.0). Electronically Signed   By: Kennith Center M.D.   On: 07/20/2023 16:54   DG Chest Port 1 View  Result Date: 07/20/2023 CLINICAL DATA:  Sepsis. Cancer patient. Recent surgery. Known lung mass. EXAM: PORTABLE CHEST 1  VIEW COMPARISON:  X-ray 07/04/2023 and older.  CT angiogram 07/11/2023 FINDINGS: Slight elevation of the left hemidiaphragm. There is some patchy left perihilar opacity with some volume loss which is stable. Fiduciary marker in the peripheral right lung with a spiculated nodule, similar as well. No consolidation, pneumothorax or effusion which is new. Chronic lung changes. Dense likely calcified right apical nodules. Normal cardiopericardial silhouette. Film is rotated to the left. Scattered degenerative changes and osteopenia. Overlapping cardiac leads. IMPRESSION: Stable appearance of the thorax. Fiduciary marker in the right lung with a focal spiculated nodule is stable. Stable bandlike opacity left perihilar with volume loss. Electronically Signed   By: Karen Kays M.D.   On: 07/20/2023 15:40   CT Angio Chest PE W and/or Wo Contrast  Result Date: 07/12/2023 CLINICAL DATA:  Pulmonary embolism (PE) suspected, high prob EXAM: CT ANGIOGRAPHY CHEST WITH CONTRAST TECHNIQUE: Multidetector CT imaging of the chest was performed using the standard protocol during bolus administration of intravenous contrast. Multiplanar CT image reconstructions and MIPs were obtained to evaluate the vascular anatomy. RADIATION DOSE REDUCTION: This exam was performed according to the departmental dose-optimization program which includes automated exposure control, adjustment of the mA and/or kV according to patient size and/or use of iterative reconstruction technique. CONTRAST:  OMNIPAQUE IOHEXOL 350 MG/ML SOLN COMPARISON:  06/03/2023 FINDINGS: Cardiovascular: No filling defects in the pulmonary arteries to suggest pulmonary emboli. Heart is normal size. Aorta is normal caliber. Three-vessel coronary artery disease. Aortic atherosclerosis. Mediastinum/Nodes: No mediastinal, hilar, or axillary adenopathy. Trachea and esophagus are unremarkable. Thyroid unremarkable. Gas is seen in the anterior mediastinum anterior to the heart,  presumably tracking from the peritoneum related to prior cholecystectomy. Lungs/Pleura: Severe centrilobular and paraseptal emphysema. Peripheral right upper lobe masslike airspace opacity posterolaterally. This appears stable when compared to prior study. Previously seen numerous subpleural nodules in the left lower lobe have decreased in size or resolved since prior study. Areas of scarring in the left lung. No pleural effusions. Upper Abdomen: Few small locules of free intraperitoneal air, presumably related to recent cholecystectomy. Surgical drain in the gallbladder fossa status post cholecystectomy. Musculoskeletal: Chest wall soft tissues are unremarkable. No acute bony abnormality. Review of the MIP images confirms the above findings. IMPRESSION: Continued peripheral masslike density in the right upper lobe. Differential considerations would include malignancy, infection, or scarring. Areas  of scarring throughout the left lung. Decreasing or resolved nodules in the left lower lobe. Three-vessel coronary artery disease. Pneumomediastinum with gas in the anterior lower mediastinum anterior to the heart. There is also a few locules of free air in the abdomen. These findings are presumably related to recent cholecystectomy. Aortic Atherosclerosis (ICD10-I70.0) and Emphysema (ICD10-J43.9). Electronically Signed   By: Charlett Nose M.D.   On: 07/12/2023 01:56   CT ABDOMEN PELVIS W CONTRAST  Result Date: 07/12/2023 CLINICAL DATA:  Abdominal pain, status post cholecystectomy and ERCP with sphincterotomy. EXAM: CT ABDOMEN AND PELVIS WITH CONTRAST TECHNIQUE: Multidetector CT imaging of the abdomen and pelvis was performed using the standard protocol following bolus administration of intravenous contrast. RADIATION DOSE REDUCTION: This exam was performed according to the departmental dose-optimization program which includes automated exposure control, adjustment of the mA and/or kV according to patient size and/or  use of iterative reconstruction technique. CONTRAST:  OMNIPAQUE IOHEXOL 350 MG/ML SOLN COMPARISON:  07/04/2023 FINDINGS: Lower chest: Linear scarring at the left lung base. Gas is seen within the mediastinum anterior to the heart. No pleural effusions. Hepatobiliary: Changes of cholecystectomy. Surgical drain within the gallbladder fossa. Away from the surgical drain is somewhat irregular fluid collection measuring 4.7 x 2.1 cm. No biliary ductal dilatation or suspicious focal hepatic abnormality. Pancreas: No focal abnormality or ductal dilatation. Spleen: No focal abnormality.  Normal size. Adrenals/Urinary Tract: No adrenal abnormality. No focal renal abnormality. No stones or hydronephrosis. Urinary bladder is unremarkable. Stomach/Bowel: Normal appendix. Stomach, large and small bowel grossly unremarkable. Vascular/Lymphatic: Heavily calcified aorta and iliac vessels. There appears to be occlusion of the left common iliac artery. Bilateral SFA occlusions noted. Findings are stable since prior study. No adenopathy Reproductive: No visible focal abnormality. Other: No free fluid. Small amount of free air noted anteriorly within the peritoneum in the abdomen and pelvis. A few locules of retroperitoneal gas noted inferior to the left kidney. Musculoskeletal: No acute bony abnormality. IMPRESSION: Changes of recent cholecystectomy. Surgical drain is in place within the gallbladder fossa. Separate from the surgical drain is a fluid collection along the inferior margin of the liver measuring 4.7 x 2.1 cm. This could reflect a postoperative fluid collection such as hematoma or seroma. Abscess cannot be completely excluded. Single locule of gas within the fluid collection. Free air in the abdomen and pelvis, presumably related to recent postoperative state. Gas is also seen in the left retroperitoneum and in the mediastinum anterior to heart, presumably tracking from the peritoneum and related to recent  postoperative state. 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 Electronically Signed   By: Charlett Nose M.D.   On: 07/12/2023 01:35   DG ERCP  Result Date: 07/06/2023 CLINICAL DATA:  [id number] Surgery, elective [id number] EXAM: ERCP TECHNIQUE: Multiple spot images obtained with the fluoroscopic device and submitted for interpretation post-procedure. COMPARISON:  Intraop cholangiogram from previous day FINDINGS: A series of fluoroscopic spot images document endoscopic cannulation and opacification of the CBD, with passage of balloon tipped catheter. No extravasation. Incomplete opacification of the intrahepatic biliary tree which appears mildly distended centrally. IMPRESSION: Endoscopic CBD cannulation and intervention as above. Electronically Signed   By: Corlis Leak M.D.   On: 07/06/2023 16:23   DG Cholangiogram Operative  Result Date: 07/06/2023 CLINICAL DATA:  Intraoperative cholangiogram for elective cholecystectomy EXAM: INTRAOPERATIVE CHOLANGIOGRAM TECHNIQUE: Cholangiographic images from the C-arm fluoroscopic device were submitted for interpretation post-operatively. Please see the procedural report for the amount  of contrast and the fluoroscopy time utilized. FLUOROSCOPY: Radiation Exposure Index (as provided by the fluoroscopic device): 6.91 mGy Kerma COMPARISON:  Abdominal ultrasound 07/04/2023 FINDINGS: 2 cine clips are submitted for review. The images demonstrate cannulation of the cystic duct remanent and opacification of the biliary tree. There is mild biliary ductal dilatation. A small filling defect is present in the distal common bile duct. A trace amount of contrast material trickles around the filling defect in through the ampulla. IMPRESSION: 1. Positive for incompletely obstructing choledocholithiasis. 2. Mild biliary ductal dilatation. Electronically Signed   By: Malachy Moan M.D.   On: 07/06/2023 08:18   ECHOCARDIOGRAM  COMPLETE  Result Date: 07/04/2023    ECHOCARDIOGRAM REPORT   Patient Name:   ULYESS MUTO Date of Exam: 07/04/2023 Medical Rec #:  [id number]     Height:       72.0 in Accession #:    [id number]    Weight:       165.0 lb Date of Birth:  01-27-1961     BSA:          1.963 m Patient Age:    62 years      BP:           119/73 mmHg Patient Gender: M             HR:           76 bpm. Exam Location:  Inpatient Procedure: 2D Echo, Cardiac Doppler, Color Doppler and Intracardiac            Opacification Agent Indications:    CHF I50.9  History:        Patient has prior history of Echocardiogram examinations, most                 recent 10/13/2022. CHF, Stroke, PAD and Emphysema; Risk                 Factors:Dyslipidemia and Former Smoker.  Sonographer:    Dondra Prader RVT RCS Referring Phys: [phone] Cliford MANUEL ORTIZ  Sonographer Comments: Technically challenging study due to limited acoustic windows, Technically difficult study due to poor echo windows, suboptimal parasternal window, suboptimal apical window and suboptimal subcostal window. Technically difficult exam  due to very poor and limited windows; attempted Definity. IMPRESSIONS  1. Left ventricular ejection fraction, by estimation, is 60 to 65%. The left ventricle has normal function. Left ventricular endocardial border not optimally defined to evaluate regional wall motion. There is mild left ventricular hypertrophy. Left ventricular diastolic parameters are consistent with Grade I diastolic dysfunction (impaired relaxation).  2. Right ventricular systolic function is mildly reduced. The right ventricular size is normal.  3. The mitral valve is normal in structure. Trivial mitral valve regurgitation. No evidence of mitral stenosis.  4. The aortic valve was not well visualized. Aortic valve regurgitation is not visualized. No aortic stenosis is present.  5. Aortic dilatation noted. There is borderline dilatation of the ascending aorta, measuring 39 mm.  6. The  inferior vena cava is normal in size with greater than 50% respiratory variability, suggesting right atrial pressure of 3 mmHg.  7. Technically very limited study with poor images even with Definity contrast. LV function appears normal. Suspect RV function mildly decreased. FINDINGS  Left Ventricle: Left ventricular ejection fraction, by estimation, is 60 to 65%. The left ventricle has normal function. Left ventricular endocardial border not optimally defined to evaluate regional wall motion. Definity contrast agent was given IV to delineate the  left ventricular endocardial borders. The left ventricular internal cavity size was normal in size. There is mild left ventricular hypertrophy. Left ventricular diastolic parameters are consistent with Grade I diastolic dysfunction (impaired relaxation). Right Ventricle: The right ventricular size is normal. No increase in right ventricular wall thickness. Right ventricular systolic function is mildly reduced. Left Atrium: Left atrial size was normal in size. Right Atrium: Right atrial size was normal in size. Pericardium: There is no evidence of pericardial effusion. Mitral Valve: The mitral valve is normal in structure. Trivial mitral valve regurgitation. No evidence of mitral valve stenosis. Tricuspid Valve: The tricuspid valve is not well visualized. Tricuspid valve regurgitation is trivial. No evidence of tricuspid stenosis. Aortic Valve: The aortic valve was not well visualized. Aortic valve regurgitation is not visualized. No aortic stenosis is present. Pulmonic Valve: The pulmonic valve was not well visualized. Pulmonic valve regurgitation is not visualized. No evidence of pulmonic stenosis. Aorta: Aortic dilatation noted. There is borderline dilatation of the ascending aorta, measuring 39 mm. Venous: The inferior vena cava is normal in size with greater than 50% respiratory variability, suggesting right atrial pressure of 3 mmHg. IAS/Shunts: No atrial level shunt  detected by color flow Doppler. Additional Comments: A venous catheter is visualized.  LEFT VENTRICLE PLAX 2D LVIDd:         4.80 cm   Diastology LVIDs:         3.90 cm   LV e' medial:    8.16 cm/s LV PW:         1.10 cm   LV E/e' medial:  7.1 LV IVS:        1.10 cm   LV e' lateral:   10.00 cm/s LVOT diam:     2.20 cm   LV E/e' lateral: 5.8 LV SV:         63 LV SV Index:   32 LVOT Area:     3.80 cm  RIGHT VENTRICLE             IVC RV S prime:     12.90 cm/s  IVC diam: 1.20 cm TAPSE (M-mode): 2.8 cm LEFT ATRIUM         Index LA diam:    3.60 cm 1.83 cm/m  AORTIC VALVE             PULMONIC VALVE LVOT Vmax:   84.20 cm/s  PV Vmax:       0.90 m/s LVOT Vmean:  61.900 cm/s PV Peak grad:  3.3 mmHg LVOT VTI:    0.165 m  AORTA Ao Root diam: 3.50 cm Ao Asc diam:  3.90 cm MITRAL VALVE MV Area (PHT): 2.54 cm    SHUNTS MV Decel Time: 299 msec    Systemic VTI:  0.16 m MV E velocity: 57.80 cm/s  Systemic Diam: 2.20 cm MV A velocity: 75.80 cm/s MV E/A ratio:  0.76 Arvilla Meres MD Electronically signed by Arvilla Meres MD Signature Date/Time: 07/04/2023/4:21:28 PM    Final    US Abdomen Limited RUQ (LIVER/GB)  Result Date: 07/04/2023 CLINICAL DATA:  Right upper quadrant pain. EXAM: ULTRASOUND ABDOMEN LIMITED RIGHT UPPER QUADRANT COMPARISON:  None Available. FINDINGS: Gallbladder: Multiple gallstones evident measuring up to 11 mm. Gallbladder wall thickness varies from 3-5 mm thickness. Small volume pericholecystic fluid evident. Common bile duct: Diameter: Not well seen. Liver: Coarsening of hepatic echotexture evident without a discrete or focal mass lesion evident. Suspicion for trace intrahepatic biliary duct dilatation. Portal vein is patent on color Doppler  imaging with normal direction of blood flow towards the liver. Other: None. IMPRESSION: 1. Cholelithiasis with gallbladder wall thickening and small volume pericholecystic fluid. Imaging features are concerning for acute cholecystitis. 2. Suspicion for trace  intrahepatic biliary duct dilatation. Common bile duct not well seen. Electronically Signed   By: Kennith Center M.D.   On: 07/04/2023 05:40   CT ABDOMEN PELVIS W CONTRAST  Result Date: 07/04/2023 CLINICAL DATA:  Bowel obstruction suspected. Ongoing vomiting. History of lung cancer. EXAM: CT ABDOMEN AND PELVIS WITH CONTRAST TECHNIQUE: Multidetector CT imaging of the abdomen and pelvis was performed using the standard protocol following bolus administration of intravenous contrast. RADIATION DOSE REDUCTION: This exam was performed according to the departmental dose-optimization program which includes automated exposure control, adjustment of the mA and/or kV according to patient size and/or use of iterative reconstruction technique. CONTRAST:  OMNIPAQUE IOHEXOL 300 MG/ML  SOLN COMPARISON:  04/08/2023 FINDINGS: Lower chest: Reticulation in the left lower lobe. No acute finding. Hepatobiliary: No focal liver abnormality.Edematous thickening of the gallbladder which is distended. The CBD measures up to 9 mm in diameter. No calcified gallstone. Pancreas: Generalized atrophy. Spleen: Unremarkable. Adrenals/Urinary Tract: Negative adrenals. No hydronephrosis or stone. Unremarkable bladder. Stomach/Bowel:  No obstruction. No appendicitis. Vascular/Lymphatic: No acute vascular abnormality. Severe atheromatous plaque affecting the aorta and branch vessels with bilateral chronic SFA and left common iliac artery occlusions. Numerous bilateral inflow stenoses. No mass or adenopathy. Reproductive:No pathologic findings. Other: No ascites or pneumoperitoneum. Musculoskeletal: No acute abnormalities. IMPRESSION: 1. Gallbladder distension and edema as with acute cholecystitis. No calcified gallstones, suggest ultrasound correlation. 2. Severe atherosclerosis with chronic occlusions of the left common iliac and bilateral SFA. Electronically Signed   By: Tiburcio Pea M.D.   On: 07/04/2023 04:04   CT Head Wo  Contrast  Result Date: 07/04/2023 CLINICAL DATA:  Head trauma EXAM: CT HEAD WITHOUT CONTRAST CT CERVICAL SPINE WITHOUT CONTRAST TECHNIQUE: Multidetector CT imaging of the head and cervical spine was performed following the standard protocol without intravenous contrast. Multiplanar CT image reconstructions of the cervical spine were also generated. RADIATION DOSE REDUCTION: This exam was performed according to the departmental dose-optimization program which includes automated exposure control, adjustment of the mA and/or kV according to patient size and/or use of iterative reconstruction technique. 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 Ex vacuo dilatation of the left lateral ventricle. There is hypoattenuation of the periventricular white matter, most commonly indicating chronic ischemic microangiopathy. Vascular: No abnormal hyperdensity of the major intracranial arteries or dural venous sinuses. No intracranial atherosclerosis. Skull: The visualized skull base, calvarium and extracranial soft tissues are normal. Sinuses/Orbits: No fluid levels or advanced mucosal thickening of the visualized paranasal sinuses. No mastoid or middle ear effusion. The orbits are normal. CT CERVICAL SPINE FINDINGS Alignment: No static subluxation. Facets are aligned. Occipital condyles are normally positioned. Skull base and vertebrae: No acute fracture. Soft tissues and spinal canal: No prevertebral fluid or swelling. No visible canal hematoma. Disc levels: No advanced spinal canal or neural foraminal stenosis. Upper chest: No pneumothorax, pulmonary nodule or pleural effusion. Other: Normal visualized paraspinal cervical soft tissues. IMPRESSION: 1. No acute fracture or static subluxation of the cervical spine. 2. 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 Electronically Signed   By: Deatra Robinson  M.D.   On: 07/04/2023 03:25   CT Cervical Spine Wo Contrast  Result Date: 07/04/2023 CLINICAL DATA:  Head trauma EXAM: CT HEAD WITHOUT CONTRAST CT CERVICAL SPINE WITHOUT CONTRAST TECHNIQUE: Multidetector CT imaging of the head and cervical spine was performed following the standard protocol without intravenous contrast. Multiplanar CT image reconstructions of the cervical spine were also generated. RADIATION DOSE REDUCTION: This exam was performed according to the departmental dose-optimization program which includes automated exposure control, adjustment of the mA and/or kV according to patient size and/or use of iterative reconstruction technique. 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 Ex vacuo dilatation of the left lateral ventricle. There is hypoattenuation of the periventricular white matter, most commonly indicating chronic ischemic microangiopathy. Vascular: No abnormal hyperdensity of the major intracranial arteries or dural venous sinuses. No intracranial atherosclerosis. Skull: The visualized skull base, calvarium and extracranial soft tissues are normal. Sinuses/Orbits: No fluid levels or advanced mucosal thickening of the visualized paranasal sinuses. No mastoid or middle ear effusion. The orbits are normal. CT CERVICAL SPINE FINDINGS Alignment: No static subluxation. Facets are aligned. Occipital condyles are normally positioned. Skull base and vertebrae: No acute fracture. Soft tissues and spinal canal: No prevertebral fluid or swelling. No visible canal hematoma. Disc levels: No advanced spinal canal or neural foraminal stenosis. Upper chest: No pneumothorax, pulmonary nodule or pleural effusion. Other: Normal visualized paraspinal cervical soft tissues. IMPRESSION: 1. No acute fracture or static subluxation of the cervical spine. 2. Old left MCA territory infarct and old left  cerebellar infarcts. Electronically Signed   By: Deatra Robinson M.D.   On: 07/04/2023 03:25   DG Chest Port 1 View  Result Date: 07/04/2023 CLINICAL DATA:  Fever, cancer patient, vomiting EXAM: PORTABLE CHEST 1 VIEW COMPARISON:  Radiographs 06/11/2023 FINDINGS: No change from 06/11/2023. Unchanged dense bandlike consolidation in the peripheral right mid lung with biopsy clip. Bandlike fibrotic change in the mid left lung. No new focal consolidation, pleural effusion, or pneumothorax. No displaced rib fractures. Stable cardiomediastinal silhouette. Aortic atherosclerotic calcification. IMPRESSION: Unchanged consolidation/scarring in the bilateral mid lungs. Electronically Signed   By: Minerva Fester M.D.   On: 07/04/2023 01:25    Labs:  CBC: Recent Labs    07/09/23 0525 07/11/23 2350 07/20/23 1517 07/20/23 1538 07/21/23 0506  WBC 9.6 10.9* 32.4*  --  17.6*  HGB 13.2 11.9* 14.1 16.3 10.8*  HCT 40.9 37.5* 45.9 48.0 34.9*  PLT 199 320 392  --  232    COAGS: Recent Labs    12/02/22 0303 04/13/23 0019 07/04/23 0850 07/06/23 0550 07/20/23 1517  INR 1.5* 1.2 1.5* 1.3* 1.4*  APTT 37* 33 25  --  32    BMP: Recent Labs    07/09/23 0525 07/11/23 2350 07/20/23 1517 07/20/23 1538 07/21/23 0506  NA 136 133* 138 138 135  K 4.0 3.9 4.5 4.5 3.7  CL 100 101 100 100 99  CO2 28 25 23   --  27  GLUCOSE 99 107* 168* 165* 123*  BUN 16 24* 22 21 16   CALCIUM 8.6* 8.4* 9.5  --  8.5*  CREATININE 0.93 0.84 1.32* 1.20 0.86  GFRNONAA >60 >60 >60  --  >60    LIVER FUNCTION TESTS: Recent Labs    07/09/23 0525 07/11/23 2350 07/20/23 1517 07/21/23 0506  BILITOT 0.4 0.4 0.9 0.7  AST 28 22 22 15   ALT 35 34 18 14  ALKPHOS 68 77 80 59  PROT 6.9 6.8 8.1 6.2*  ALBUMIN 3.0* 3.2* 3.8 2.8*    TUMOR MARKERS:  No results for input(s): "AFPTM", "CEA", "CA199", "CHROMGRNA" in the last 8760 hours.  Assessment and Plan: Intra-abdominal fluid collection Patient with gall bladder fossa abscess not  adequately drained by current surgical drain.  IR consulted for gall bladder fossa drain placement.   Case reviewed by Dr. Archer Asa as well as Dr. Bryn Gulling who note the collection is small and may not be amenable.  Plan to monitor clinically over the next 48 hrs and consider repeat imaging if patient does not improve.   Attempted to update cousin, Osborn Rodriquez, however no answer. LVM.  Thank you for this interesting consult.  I greatly enjoyed meeting MISHAEL HARAN and look forward to participating in their care.  A copy of this report was sent to the requesting provider on this date.  Electronically Signed: Hoyt Koch, PA 07/21/2023, 3:38 PM   I spent a total of 40 Minutes    in face to face in clinical consultation, greater than 50% of which was counseling/coordinating care for gall bladder fossa abscess.

## 2023-07-21 NOTE — Progress Notes (Signed)
Pharmacy Antibiotic Note  Todd Estrada is a 62 y.o. male admitted on 07/20/2023 with sepsis.  History of recent abdominal surgery on 7/17 and 7/18. Surgical drain in place. Pharmacy initially consulted for cefepime and vancomycin dosing. Cefepime then changed to Zosyn with concern for seizure activity to reduce risk of neurotoxicity.  Plan: -Continue Zosyn q8h extended infusion -Change vancomycin to 1000 mg IV q12h -Monitor renal function, cultures and clinical progress for dose adjustments and de-escalation as indicated   Height: 6' (182.9 cm) Weight: 74.8 kg (165 lb) IBW/kg (Calculated) : 77.6  Temp (24hrs), Avg:99.8 F (37.7 C), Min:99 F (37.2 C), Max:100.5 F (38.1 C)  Recent Labs  Lab 07/20/23 1517 07/20/23 1537 07/20/23 1538 07/20/23 1802 07/20/23 2359 07/21/23 0506  WBC 32.4*  --   --   --   --  17.6*  CREATININE 1.32*  --  1.20  --   --  0.86  LATICACIDVEN  --  6.2*  --  5.0* 1.4  --     Estimated Creatinine Clearance: 93 mL/min (by C-G formula based on SCr of 0.86 mg/dL).    No Known Allergies  Antimicrobials this admission: Zosyn 8/2 >> Vancomycin 8/1 >>  Cefepime 8/1 x 2   Microbiology results: 8/1 BCx: ngtd 8/1 resp panel: neg   Thank you for allowing pharmacy to be a part of this patient's care.  Pricilla Riffle, PharmD, BCPS Clinical Pharmacist 07/21/2023 11:16 AM

## 2023-07-21 NOTE — Progress Notes (Signed)
62 y.o. male inpatient. Droplet precaution.s History of CVA with residual aphasia, , HTN, COPD, non small cell lung cancer (RUL).  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 Discharged with surgical drain in placement. Patient presented to the ED at [facility] on 8.1.23 with progressive weakness and fevers. Found to be septic. CT Abd pelvis from 8.1.24 reads 4.0 x 3.4 x 2.5 cm complex fluid collection in the gallbladder fossa containing a small gas bubble. Gas from surgery would not be expected after about 7-10 days. Gas could also be due to the presence of the surgical drain, but infection/evolving abscess in the gallbladder fossa a distinct concern. Team is requesting an intra abdominal abscess drain placement for infection control.   Case reviewed and approved by IR Attending Dr. Marliss Coots. Per Team Patient to be transferred to [facility] for further evaluation of seizures. Patient will need to be seen and consented for procedure.

## 2023-07-21 NOTE — Care Management (Signed)
Transition of Care [facility]) - Inpatient Brief Assessment   Patient Details  Name: Todd Estrada MRN: [id number] Date of Birth: Jul 10, 1961  [facility] of [facility] CM/SW Contact:    Lockie Pares, RN Phone Number: 07/21/2023, 3:32 PM   Clinical Narrative:  Patient presented with sepsis, just DC last week with abdominal drain. New fluid collection in the abdomen, will need IR and IV abx. He declined Home Health last admission.  Will need re-evaluation  form PT and possible  IV antibiotics longer term.   TOC will continue to follow for needs, recommendations, and transitions of care  Transition of Care Asessment:   Patient has primary care physician: Yes Home environment has been reviewed: yes Prior level of function:: With assistance from family and DME Prior/Current Home Services: No current home services (Refused [facility] last admission ( end of July)) Social Determinants of Health Reivew: SDOH reviewed no interventions necessary Readmission risk has been reviewed: Yes (High) Transition of care needs: transition of care needs identified, TOC will continue to follow

## 2023-07-21 NOTE — Consult Note (Signed)
Neurology Consultation Reason for Consult: seizures Referring Physician: Dr. Sharl Ma  CC: code sepsis  History is obtained from:chart review  HPI: [name] is a 62 y.o. 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 He presented to the ED with reported weakness and fever. Sepsis protocol was initiated in ED. Per chart review, family stated that his left side was twitching.  Per family, patient takes Keppra and his last seizure was 2 years ago. Neurology was consulted for evaluation for further evaluation of seizures. EEG has already been ordered by primary and patient was loaded with 1500mg  and started on maintenance dose of 500mg  BID (his home dose) of Keppra.  He had a cholecystectomy on July 17 and he currently has a JP drain in place. He was seen in the ED on July 23 for abdominal pain and was noted to have a fluid collection around the area of the JP drain. Surgery was consulted on this admission and ordered IR consult for drain placement.  On exam, patient is aphasic with right hemiparesis (his baseline from previous stroke). He is able to nod yes and no appropriately to orientation questions, moves left side spontaneously with good strength, no tremors or seizure-like activity seen.    ROS: Unable to obtain due to altered mental status.   Past Medical History:  Diagnosis Date   Asthma    Atypical chest pain 08/13/2022   Community acquired pneumonia 09/14/2022   COPD (chronic obstructive pulmonary disease) (HCC)    Essential hypertension 08/19/2021   GERD (gastroesophageal reflux disease)    HAP (hospital-acquired pneumonia) 09/16/2022   History of tracheostomy    03/09/22-04/11/22   HLD (hyperlipidemia)    Hypertension    Hypokalemia 08/13/2022   Hypomagnesemia 08/13/2022   Lung cancer (HCC)    PAD (peripheral artery disease) (HCC)     Seizures (HCC) 06/02/2022   Sepsis (HCC) 08/13/2022   Sepsis due to pneumonia (HCC) 04/13/2023   Stroke (HCC) 02/2022    Family History  Problem Relation Age of Onset   Throat cancer Mother    Liver cancer Father    Kidney failure Sister    Cancer - Lung Paternal Uncle     Social History:  reports that he quit smoking about 14 months ago. His smoking use included cigarettes. He has never been exposed to tobacco smoke. He has never used smokeless tobacco. He reports that he does not currently use alcohol. He reports that he does not use drugs.  Exam: Current vital signs: BP 115/73 (BP Location: Left Arm)   Pulse 82   Temp 99.1 F (37.3 C) (Oral)   Resp (!) 21   Ht 6' (1.829 m)   Wt 74.8 kg   SpO2 95%   BMI 22.38 kg/m  Vital signs in last 24 hours: Temp:  [99 F (37.2 C)-100.5 F (38.1 C)] 99.1 F (37.3 C) (08/02 1255) Pulse Rate:  [81-134] 82 (08/02 1255) Resp:  [16-30] 21 (08/02 1255) BP: (84-141)/(57-92) 115/73 (08/02 1255) SpO2:  [92 %-100 %] 95 % (08/02 1255) Weight:  [74.8 kg] 74.8 kg (08/01 1511)   Physical Exam  Appears well-developed and well-nourished.   Neuro: Mental Status: Patient is awake, alert, oriented to person, place, month, year, and situation. Patient has expressive aphasia. Low verbal output is non-sensical and dysarthric.  Cranial Nerves: II: Visual Fields are full. Pupils are equal, round, and  reactive to light.   III,IV, VI: EOMI without or diploplia. Slight ptosis to right eye. V: Facial sensation is symmetric to temperature VII: Facial movement is symmetric.  VIII: hearing is intact to voice X: Uvula elevates symmetrically XI: Shoulder shrug is symmetric. XII: tongue is midline without atrophy or fasciculations.  Motor: Tone is normal. Bulk is normal.  Right dense hemiparesis Sensory: Sensation is symmetric to light touch and temperature in the arms and legs. Cerebellar: FNF and HKS are intact on left, unable to perform on right.     I have reviewed labs in epic and the results pertinent to this consultation are: WBC: 32.4 -> 17.6 Lactic Acid: 6.2 -> 5.0 -> 1.4 Urine Culture: pending Blood Culture: Neg Prelim Resp Panel: Negative  B12/Fol: WNL PreAlbumin: 14 Venous ABG CO2: 39   I have reviewed the images obtained:  CTH: Negative acute. 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   Impression: Todd Estrada is a 62 y.o. 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 He presented to the ED with reported weakness and fever. Sepsis protocol was initiated in ED. Patient is due to have IR drain placement for infection control due to gas seen on abdomen CT (recent lap chole and ERCP). Patient has a history of seizures and per family was complaint on Keppra. It is possible that his current infectious process causes seizures.   Recommendations: - seizure precautions - routine EEG - continue Keppra 500mg  BID (home dose) - will adjust AEDs as needed based on EEG findings - infectious process mgmt per primary - encourage hydration, PO intake - encourage PT/OT, OOB as able    Pt seen by Neuro NP/APP and later by MD. Note/plan to be edited by MD as needed.    Lynnae January, DNP, AGACNP-BC [facility] Please use AMION for contact information & EPIC for messaging.  I have seen the patient and reviewed the above note.  He has had worsening of his underlying deficits in the setting of acute infection (recrudescence).  With negative EEG, I would expect his underlying deficits to gradually improve as his medical condition improves.  If he were to have continued discrete episodes concerning for seizure, could consider continuous EEG but at this time I feel that this is likely low yield.  Neurology will continue to be available on as-needed basis, please call with further  questions or concerns.  Ritta Slot, MD [facility] [phone]  If 7pm- 7am, please page neurology on call as listed in AMION.

## 2023-07-21 NOTE — Progress Notes (Signed)
EEG complete - results pending 

## 2023-07-21 NOTE — Progress Notes (Signed)
PROGRESS NOTE    Todd Estrada  PPI:[id number] DOB: 05/12/1961 DOA: 07/20/2023 PCP: [name], MD    Brief Narrative:   Todd Estrada is a 62 y.o. male with past medical history significant for left MCA CVA with aphasia/right hemiparesis, seizure disorder, COPD, HTN, HLD, PAD on Eliquis, chronic hypotension/autonomic dysfunction, non-small cell/squamous cell lung cancer recent laparoscopic cholecystectomy by Dr. Michaell Cowing on 7/17 complicated by empyema with drain remaining in place who was brought to [facility] ED on 8/2 by his son with concerns of progressive weakness, abdominal pain and fever.  HPI difficult to obtain from patient given his aphasia with family contributing.  Family also states that he has started to "shake" with "twitching" on the left side of his body.  Has been taking his Keppra with last seizure reported 2 years prior but family unsure what his seizures "look like".  In the ED, temperature 100.5 F, HR 134, RR 23, BP 84/63, SpO2 100% on room air.  WBC 32.4, hemoglobin 14.1, platelets 392.  Sodium 138, potassium 4.5, chloride 100, CO2 23, glucose 168, BUN 22, creatinine 1.32.  Lipase 28, AST 22, ALT 18, total bilirubin 0.9.  Lactic acid 6.2.  COVID/influenza/RSV PCR negative. Urinalysis unrevealing.  CT head without contrast with no acute intracranial abnormality, similar-appearing chronic left MCA territory and left cerebellar infarctions.  CT Abdo/pelvis with contrast with 4.0 x 3.0 x 2.5 cm complex fluid collection within the gallbladder fossa.  Patient was given 2.25 L LR bolus, started on empiric antibiotics with vancomycin, cefepime, metronidazole.  General surgery was consulted with recommendations of admission to medicine.  Given concern for possible seizure activity, patient was loaded on IV Keppra and admitting physician, Dr. Adela Glimpse requested admission to [facility] given the need for neurology evaluation as well and EEG.  Assessment & Plan:   Severe  sepsis, POA Lactic acidosis Gallbladder fossa abscess Patient presenting with progressive weakness, fever with temperature 100.5, hypotensive, tachycardic, tachypneic with WBC count 32.4 on admission with a lactic acid of 6.2.  CT abdomen/pelvis with contrast notable for 4.0 x 3.0 x 2.5 cm complex fluid collection within the gallbladder fossa despite drain being in place. -- General surgery/interventional radiology following, appreciate assistance -- WBC 32.4>17.6 -- Vancomycin, pharmacy consulted for dosing/monitoring -- Zosyn 3.375 g IV every 8 hours -- Continue IVF with NS at 100 mL/h -- IR for repeat drain placement; n.p.o. -- Holding eliquis -- CBC daily  Seizure disorder with concern of recurrence Family reports patient was notably twitching/shaking, seemed more confused.  Compliant with Keppra.  Last seizure reported 2 years ago.  Loaded with IV Keppra on admission. 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 Arbutus Ped outpatient.  Currently not on therapy.  Moderate protein calorie malnutrition --Dietitian consult    DVT prophylaxis: SCDs Start: 07/21/23 0042    Code Status: DNR Family Communication: No family present at bedside this morning  Disposition Plan:  Level of care: Progressive Status is: Inpatient Remains inpatient appropriate  because: Pending IR drain placement, IV antibiotics    Consultants:  General  surgery Interventional radiology Neurology  Procedures:  EEG Pending IR CT-guided drain placement  Antimicrobials:  Vancomycin 8/1>> Zosyn 8/1>> Cefepime 8/1 - 8/1 Metronidazole 8/1 - 8/1   Subjective: Patient seen examined bedside, resting comfortably.  Lying in bed.  Difficult to communicate given his aphasia.  No family present.  Will nod yes or no to answer questions, appears to be complaining of some abdominal discomfort surrounding his current drain site.  Discussed with general surgery, Dr. Cliffton Asters; plan for IR placement of a new drain.  Pending transfer to [facility] for neurology evaluation and EEG for concern of seizure activity.  Objective: Vitals:   07/21/23 1000 07/21/23 1059 07/21/23 1200 07/21/23 1255  BP: 118/69  127/66 115/73  Pulse:   81 82  Resp: (!) 21  (!) 23 (!) 21  Temp:  99.1 F (37.3 C)  99.1 F (37.3 C)  TempSrc:  Oral  Oral  SpO2:   97% 95%  Weight:      Height:        Intake/Output Summary (Last 24 hours) at 07/21/2023 1457 Last data filed at 07/21/2023 1008 Gross per 24 hour  Intake 7696.31 ml  Output --  Net 7696.31 ml   Filed Weights   07/20/23 1511  Weight: 74.8 kg    Examination:  Physical Exam: GEN: NAD, alert, chronically ill appearance, appears older than stated age HEENT: NCAT, PERRL, EOMI, sclera clear, dry mucous membranes PULM: CTAB w/o wheezes/crackles, normal respiratory effort, on room air CV: RRR w/o M/G/R GI: abd soft, mild tenderness surrounding drain site with mild erythema, noted purulent material in JP collection bulb, + BS, no RR/G/M MSK: no peripheral edema     Data Reviewed: I have personally reviewed following labs and imaging studies  CBC: Recent Labs  Lab 07/20/23 1517 07/20/23 1538 07/21/23 0506  WBC 32.4*  --  17.6*  NEUTROABS 29.4*  --  15.7*  HGB 14.1 16.3 10.8*  HCT 45.9 48.0 34.9*  MCV 94.3  --  93.8  PLT 392  --  232   Basic Metabolic Panel: Recent Labs  Lab 07/20/23 1517 07/20/23 1538  07/20/23 1948 07/21/23 0506  NA 138 138  --  135  K 4.5 4.5  --  3.7  CL 100 100  --  99  CO2 23  --   --  27  GLUCOSE 168* 165*  --  123*  BUN 22 21  --  16  CREATININE 1.32* 1.20  --  0.86  CALCIUM 9.5  --   --  8.5*  MG  --   --  1.6* 2.0  PHOS  --   --  2.8 3.4   GFR: Estimated Creatinine Clearance: 93 mL/min (by C-G formula based on SCr of 0.86 mg/dL). Liver Function Tests: Recent Labs  Lab 07/20/23 1517 07/21/23 0506  AST 22 15  ALT 18 14  ALKPHOS 80 59  BILITOT 0.9 0.7  PROT 8.1 6.2*  ALBUMIN 3.8 2.8*   Recent Labs  Lab 07/20/23 1517  LIPASE 28   No results for input(s): "AMMONIA" in the last 168 hours. Coagulation Profile: Recent Labs  Lab 07/20/23 1517  INR 1.4*   Cardiac Enzymes: Recent Labs  Lab 07/20/23 1948  CKTOTAL 22*   BNP (last 3 results) No results for input(s): "PROBNP" in the last 8760 hours. HbA1C: No results for input(s): "HGBA1C" in the last 72 hours. CBG: No results  for input(s): "GLUCAP" in the last 168 hours. Lipid Profile: No results for input(s): "CHOL", "HDL", "LDLCALC", "TRIG", "CHOLHDL", "LDLDIRECT" in the last 72 hours. Thyroid Function Tests: Recent Labs    07/20/23 1948  TSH 3.157   Anemia Panel: Recent Labs    07/20/23 1948 07/20/23 1955 07/21/23 0506  VITAMINB12  --   --  396  FOLATE  --   --  21.9  FERRITIN  --  378*  --   TIBC  --  253  --   IRON  --  20*  --   RETICCTPCT 1.4  --   --    Sepsis Labs: Recent Labs  Lab 07/20/23 1537 07/20/23 1802 07/20/23 1948 07/20/23 2359  PROCALCITON  --   --  0.96  --   LATICACIDVEN 6.2* 5.0*  --  1.4    Recent Results (from the past 240 hour(s))  Blood culture (routine x 2)     Status: None   Collection Time: 07/12/23 12:28 AM   Specimen: BLOOD LEFT FOREARM  Result Value Ref Range Status   Specimen Description   Final    BLOOD LEFT FOREARM Performed at [facility], [address] [place], [place] [address]    Special Requests   Final     BOTTLES DRAWN AEROBIC AND ANAEROBIC Blood Culture adequate volume Performed at [facility] [place] [facility], [address], [place], [place] [address]    Culture   Final    NO GROWTH 5 DAYS Performed at [facility], [address] [place], [place] [address]    Report Status 07/17/2023 FINAL  Final  Blood Culture (routine x 2)     Status: None (Preliminary result)   Collection Time: 07/20/23  3:17 PM   Specimen: BLOOD  Result Value Ref Range Status   Specimen Description   Final    BLOOD LEFT ANTECUBITAL Performed at [facility], [address] [place], [place] [address]    Special Requests   Final    BOTTLES DRAWN AEROBIC AND ANAEROBIC Blood Culture adequate volume Performed at [facility], [address], [place], [place] [address]    Culture   Final    NO GROWTH < 12 HOURS Performed at [facility], [address], [place], [place] [address]    Report Status PENDING  Incomplete  Resp panel by RT-PCR (RSV, Flu A&B, Covid) Anterior Nasal Swab     Status: None   Collection Time: 07/20/23  3:21 PM   Specimen: Anterior Nasal Swab  Result Value Ref Range Status   SARS Coronavirus 2 by RT PCR NEGATIVE NEGATIVE Final    Comment: (NOTE) SARS-CoV-2 target nucleic acids are NOT DETECTED.  The SARS-CoV-2 RNA is generally detectable in upper respiratory specimens during the acute phase of infection. The lowest concentration of SARS-CoV-2 viral copies this assay can detect is 138 copies/mL. A negative result does not preclude SARS-Cov-2 infection and should not be used as the sole basis for treatment or other patient management decisions. A negative result may occur with  improper specimen collection/handling, submission of specimen other than nasopharyngeal swab, presence of viral mutation(s) within the areas targeted by this assay, and inadequate number of viral copies(<138 copies/mL). A negative result must be combined  with clinical observations, patient history, and epidemiological information. The expected result is Negative.  Fact Sheet for Patients:  BloggerCourse.com  Fact Sheet for Healthcare Providers:  SeriousBroker.it  This test is no t yet  approved or cleared by the [place] and  has been authorized for detection and/or diagnosis of SARS-CoV-2 by FDA under an Emergency Use Authorization (EUA). This EUA will remain  in effect (meaning this test can be used) for the duration of the COVID-19 declaration under Section 564(b)(1) of the Act, 21 U.S.C.section 360bbb-3(b)(1), unless the authorization is terminated  or revoked sooner.       Influenza A by PCR NEGATIVE NEGATIVE Final   Influenza B by PCR NEGATIVE NEGATIVE Final    Comment: (NOTE) The Xpert Xpress SARS-CoV-2/FLU/RSV plus assay is intended as an aid in the diagnosis of influenza from Nasopharyngeal swab specimens and should not be used as a sole basis for treatment. Nasal washings and aspirates are unacceptable for Xpert Xpress SARS-CoV-2/FLU/RSV testing.  Fact Sheet for Patients: BloggerCourse.com  Fact Sheet for Healthcare Providers: SeriousBroker.it  This test is not yet approved or cleared by the [place] FDA and has been authorized for detection and/or diagnosis of SARS-CoV-2 by FDA under an Emergency Use Authorization (EUA). This EUA will remain in effect (meaning this test can be used) for the duration of the COVID-19 declaration under Section 564(b)(1) of the Act, 21 U.S.C. section 360bbb-3(b)(1), unless the authorization is terminated or revoked.     Resp Syncytial Virus by PCR NEGATIVE NEGATIVE Final    Comment: (NOTE) Fact Sheet for Patients: BloggerCourse.com  Fact Sheet for Healthcare Providers: SeriousBroker.it  This test is not yet approved  or cleared by the [place] FDA and has been authorized for detection and/or diagnosis of SARS-CoV-2 by FDA under an Emergency Use Authorization (EUA). This EUA will remain in effect (meaning this test can be used) for the duration of the COVID-19 declaration under Section 564(b)(1) of the Act, 21 U.S.C. section 360bbb-3(b)(1), unless the authorization is terminated or revoked.  Performed at [facility], [address], [place], [place] [address]   Blood Culture (routine x 2)     Status: None (Preliminary result)   Collection Time: 07/20/23  3:21 PM   Specimen: BLOOD LEFT HAND  Result Value Ref Range Status   Specimen Description   Final    BLOOD LEFT HAND Performed at [facility], [address], [place], [place] [address]    Special Requests   Final    BOTTLES DRAWN AEROBIC AND ANAEROBIC Blood Culture adequate volume Performed at [facility], [address], [place], [place] [address]    Culture   Final    NO GROWTH < 12 HOURS Performed at [facility], [address], [place], [place] [address]    Report Status PENDING  Incomplete         Radiology Studies: CT HEAD WO CONTRAST ( )  Result Date: 07/21/2023 CLINICAL DATA:  Seizure, focal (Ped 0-17y) EXAM: CT HEAD WITHOUT CONTRAST TECHNIQUE: Contiguous axial images were obtained from the base of the skull through the vertex without intravenous contrast. RADIATION DOSE REDUCTION: This exam was performed according to the departmental dose-optimization program which includes automated exposure control, adjustment of the mA and/or kV according to patient size and/or use of iterative reconstruction technique. COMPARISON:  CT head 07/04/2023 FINDINGS: Brain: Patchy and confluent areas of decreased attenuation are noted throughout the deep and periventricular white matter of the cerebral hemispheres bilaterally, compatible with chronic microvascular ischemic disease.  Similar-appearing chronic left MCA territory and left cerebellar infarctions. No evidence of large-territorial acute infarction. No parenchymal hemorrhage. No mass lesion. No extra-axial collection. No  mass effect or midline shift. No hydrocephalus. Basilar cisterns are patent. Vascular: No hyperdense vessel. Skull: No acute fracture or focal lesion. Sinuses/Orbits: Almost complete opacification of the right maxillary sinus fluid CT of the sinuses of chronic infection. Otherwise paranasal sinuses and mastoid air cells are clear. The orbits are unremarkable. Other: None. IMPRESSION: No acute intracranial abnormality in a patient with similar-appearing chronic left MCA territory and left cerebellar infarctions. If high clinical suspicion for superimposed acute infarction, please consider MRI brain noncontrast for further evaluation. Electronically Signed   By: Tish Frederickson M.D.   On: 07/21/2023 03:41   CT ABDOMEN PELVIS W CONTRAST  Result Date: 07/20/2023 CLINICAL DATA:  Status post laparoscopic cholecystectomy for 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 EXAM: CT ABDOMEN AND PELVIS WITH CONTRAST TECHNIQUE: Multidetector CT imaging of the abdomen and pelvis was performed using the standard protocol following bolus administration of intravenous contrast. RADIATION DOSE REDUCTION: This exam was performed according to the departmental dose-optimization program which includes automated exposure control, adjustment of the mA and/or kV according to patient size and/or use of iterative reconstruction technique. CONTRAST:  80mL OMNIPAQUE IOHEXOL 300 MG/ML  SOLN COMPARISON:  07/12/2023 FINDINGS: Lower chest: Trace atelectasis or scarring noted dependent left lung base. Hepatobiliary: No suspicious focal abnormality within the liver parenchyma. 4.0 x 3.4 x 2.5 cm complex fluid collection is identified in the gallbladder fossa containing a small gas bubble (axial 33/2 and coronal 100/7). The tip of a surgical  drain is positioned in the posterior gallbladder fossa adjacent to the surgical clips coursing along the posteroinferior liver margin and out the right anterior abdominal wall. Pancreas: No focal mass lesion. No dilatation of the main duct. No intraparenchymal cyst. No peripancreatic edema. Spleen: No splenomegaly. No suspicious focal mass lesion. Adrenals/Urinary Tract: No adrenal nodule or mass. Kidneys unremarkable. No evidence for hydroureter. The urinary bladder appears normal for the degree of distention. Stomach/Bowel: Stomach is unremarkable. No gastric wall thickening. No evidence of outlet obstruction. Duodenum is normally positioned as is the ligament of Treitz. No small bowel wall thickening. No small bowel dilatation. The terminal ileum is normal. The appendix is normal. There is some wall thickening in the splenic flexure of the colon as it courses under the liver. Edema/inflammation is seen around the splenic flexure of the colon and in the right omentum with fluid and edema around the right liver and tracking towards Morison's pouch. Left colon unremarkable. Vascular/Lymphatic: There is moderate atherosclerotic calcification of the abdominal aorta without aneurysm. 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 There is no gastrohepatic or hepatoduodenal ligament lymphadenopathy. No retroperitoneal or mesenteric lymphadenopathy. No pelvic sidewall lymphadenopathy. Reproductive: Prostate gland is heterogeneous. Other: Small volume free fluid is seen in the pelvis. Extraluminal gas is seen in the midline anterior pelvis and anterior left lower quadrant towards the left groin region. This gas may be extraperitoneal in the preperitoneal fat. Musculoskeletal: No worrisome lytic or sclerotic osseous abnormality. IMPRESSION: 1. 4.0 x 3.4 x 2.5 cm complex fluid collection in the gallbladder fossa containing a small gas bubble. Gas from  surgery would not be expected after about 7-10 days. Gas could also be due to the presence of the surgical drain, but infection/evolving abscess in the gallbladder fossa a distinct concern. 2. Edema/inflammation around the splenic flexure of the colon and in the right omentum with fluid and edema around the right liver and tracking towards Morison's pouch. Given the recent surgery and  findings in the gallbladder fossa, this is probably secondary inflammatory change. Focal colitis with edema spreading up under the liver is considered less likely 3. Extraluminal gas in the midline anterior pelvis and anterior left lower quadrant towards the left groin region. Location of this small volume gas suggests that it is extraperitoneal, in the preperitoneal space. Again, while gas after surgery is not typical after about 7-10 days, lack of free gas in the peritoneal cavity makes hollow viscus perforation unlikely. Nevertheless, close clinical follow-up recommended. 4. Small volume free fluid in the pelvis. 5. 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 6.  Aortic Atherosclerosis (ICD10-I70.0). Electronically Signed   By: Kennith Center M.D.   On: 07/20/2023 16:54   DG Chest Port 1 View  Result Date: 07/20/2023 CLINICAL DATA:  Sepsis. Cancer patient. Recent surgery. Known lung mass. EXAM: PORTABLE CHEST 1 VIEW COMPARISON:  X-ray 07/04/2023 and older.  CT angiogram 07/11/2023 FINDINGS: Slight elevation of the left hemidiaphragm. There is some patchy left perihilar opacity with some volume loss which is stable. Fiduciary marker in the peripheral right lung with a spiculated nodule, similar as well. No consolidation, pneumothorax or effusion which is new. Chronic lung changes. Dense likely calcified right apical nodules. Normal cardiopericardial silhouette. Film is rotated to the left. Scattered degenerative changes and osteopenia. Overlapping cardiac  leads. IMPRESSION: Stable appearance of the thorax. Fiduciary marker in the right lung with a focal spiculated nodule is stable. Stable bandlike opacity left perihilar with volume loss. Electronically Signed   By: Karen Kays M.D.   On: 07/20/2023 15:40        Scheduled Meds: Continuous Infusions:  sodium chloride 100 mL/hr at 07/21/23 0729   levETIRAcetam Stopped (07/21/23 1008)   piperacillin-tazobactam (ZOSYN)  IV Stopped (07/21/23 0817)   vancomycin       LOS: 1 day    Time spent: 52 minutes spent on chart review, discussion with nursing staff, consultants, updating family and interview/physical exam; more than 50% of that time was spent in counseling and/or coordination of care.    Alvira Philips [place], DO [facility] Available via Epic secure chat 7am-7pm After these hours, please refer to coverage provider listed on [email or web address] 07/21/2023, 2:57 PM

## 2023-07-21 NOTE — Progress Notes (Signed)
[facility] Progress Note     Subjective: CC:  History limited by expressive aphasia. Patient does report abdominal pain in his epigastric region.   Objective: Vital signs in last 24 hours: Temp:  [99 F (37.2 C)-100.5 F (38.1 C)] 99 F (37.2 C) (08/02 0716) Pulse Rate:  [85-134] 89 (08/02 0600) Resp:  [16-30] 23 (08/02 0600) BP: (84-141)/(63-92) 106/72 (08/02 0600) SpO2:  [92 %-100 %] 94 % (08/02 0600) Weight:  [74.8 kg] 74.8 kg (08/01 1511)    Intake/Output from previous day: 08/01 0701 - 08/02 0700 In: 7546.3 [I.V.:1999; IV Piggyback:5547.3] Out: -  Intake/Output this shift: Total I/O In: 50 [IV Piggyback:50] Out: -   PE: Gen:  Alert, chronically ill appearing, NAD Card:  Regular rate and rhythm, no lower extremity edema Pulm:  Normal effort Abd: Soft, mild tenderness upper abdomen and around drain without guarding or rebound tenderness. Incisions c./d/I. some erythema around perc drain, which is draining brown, purulent fluid. Skin: warm and dry, no rashes  Psych: A&Ox3   Lab Results:  Recent Labs    07/20/23 1517 07/20/23 1538 07/21/23 0506  WBC 32.4*  --  17.6*  HGB 14.1 16.3 10.8*  HCT 45.9 48.0 34.9*  PLT 392  --  232   BMET Recent Labs    07/20/23 1517 07/20/23 1538 07/21/23 0506  NA 138 138 135  K 4.5 4.5 3.7  CL 100 100 99  CO2 23  --  27  GLUCOSE 168* 165* 123*  BUN 22 21 16   CREATININE 1.32* 1.20 0.86  CALCIUM 9.5  --  8.5*   PT/INR Recent Labs    07/20/23 1517  LABPROT 17.4*  INR 1.4*   CMP     Component Value Date/Time   NA 135 07/21/2023 0506   NA 140 09/09/2022 0908   K 3.7 07/21/2023 0506   CL 99 07/21/2023 0506   CO2 27 07/21/2023 0506   GLUCOSE 123 (H) 07/21/2023 0506   BUN 16 07/21/2023 0506   BUN 16 09/09/2022 0908   CREATININE 0.86 07/21/2023 0506   CREATININE 0.80 05/04/2023 1332   CALCIUM 8.5 (L) 07/21/2023 0506   PROT 6.2 (L) 07/21/2023 0506   ALBUMIN 2.8 (L) 07/21/2023 0506   AST 15  07/21/2023 0506   AST 13 (L) 05/04/2023 1332   ALT 14 07/21/2023 0506   ALT 9 05/04/2023 1332   ALKPHOS 59 07/21/2023 0506   BILITOT 0.7 07/21/2023 0506   BILITOT 0.5 05/04/2023 1332   GFRNONAA >60 07/21/2023 0506   GFRNONAA >60 05/04/2023 1332   Lipase     Component Value Date/Time   LIPASE 28 07/20/2023 1517       Studies/Results: CT HEAD WO CONTRAST ( )  Result Date: 07/21/2023 CLINICAL DATA:  Seizure, focal (Ped 0-17y) EXAM: CT HEAD WITHOUT CONTRAST TECHNIQUE: Contiguous axial images were obtained from the base of the skull through the vertex without intravenous contrast. RADIATION DOSE REDUCTION: This exam was performed according to the departmental dose-optimization program which includes automated exposure control, adjustment of the mA and/or kV according to patient size and/or use of iterative reconstruction technique. COMPARISON:  CT head 07/04/2023 FINDINGS: Brain: Patchy and confluent areas of decreased attenuation are noted throughout the deep and periventricular white matter of the cerebral hemispheres bilaterally, compatible with chronic microvascular ischemic disease. Similar-appearing chronic left MCA territory and left cerebellar infarctions. No evidence of large-territorial acute infarction. No parenchymal hemorrhage. No mass lesion. No extra-axial collection. No mass effect or midline shift. No hydrocephalus.  Basilar cisterns are patent. Vascular: No hyperdense vessel. Skull: No acute fracture or focal lesion. Sinuses/Orbits: Almost complete opacification of the right maxillary sinus fluid CT of the sinuses of chronic infection. Otherwise paranasal sinuses and mastoid air cells are clear. The orbits are unremarkable. Other: None. IMPRESSION: No acute intracranial abnormality in a patient with similar-appearing chronic left MCA territory and left cerebellar infarctions. If high clinical suspicion for superimposed acute infarction, please consider MRI brain noncontrast for  further evaluation. Electronically Signed   By: Tish Frederickson M.D.   On: 07/21/2023 03:41   CT ABDOMEN PELVIS W CONTRAST  Result Date: 07/20/2023 CLINICAL DATA:  Status post laparoscopic cholecystectomy for 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 EXAM: CT ABDOMEN AND PELVIS WITH CONTRAST TECHNIQUE: Multidetector CT imaging of the abdomen and pelvis was performed using the standard protocol following bolus administration of intravenous contrast. RADIATION DOSE REDUCTION: This exam was performed according to the departmental dose-optimization program which includes automated exposure control, adjustment of the mA and/or kV according to patient size and/or use of iterative reconstruction technique. CONTRAST:  80mL OMNIPAQUE IOHEXOL 300 MG/ML  SOLN COMPARISON:  07/12/2023 FINDINGS: Lower chest: Trace atelectasis or scarring noted dependent left lung base. Hepatobiliary: No suspicious focal abnormality within the liver parenchyma. 4.0 x 3.4 x 2.5 cm complex fluid collection is identified in the gallbladder fossa containing a small gas bubble (axial 33/2 and coronal 100/7). The tip of a surgical drain is positioned in the posterior gallbladder fossa adjacent to the surgical clips coursing along the posteroinferior liver margin and out the right anterior abdominal wall. Pancreas: No focal mass lesion. No dilatation of the main duct. No intraparenchymal cyst. No peripancreatic edema. Spleen: No splenomegaly. No suspicious focal mass lesion. Adrenals/Urinary Tract: No adrenal nodule or mass. Kidneys unremarkable. No evidence for hydroureter. The urinary bladder appears normal for the degree of distention. Stomach/Bowel: Stomach is unremarkable. No gastric wall thickening. No evidence of outlet obstruction. Duodenum is normally positioned as is the ligament of Treitz. No small bowel wall thickening. No small bowel dilatation. The terminal ileum is normal. The appendix is normal. There is some wall thickening  in the splenic flexure of the colon as it courses under the liver. Edema/inflammation is seen around the splenic flexure of the colon and in the right omentum with fluid and edema around the right liver and tracking towards Morison's pouch. Left colon unremarkable. Vascular/Lymphatic: There is moderate atherosclerotic calcification of the abdominal aorta without aneurysm. 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 There is no gastrohepatic or hepatoduodenal ligament lymphadenopathy. No retroperitoneal or mesenteric lymphadenopathy. No pelvic sidewall lymphadenopathy. Reproductive: Prostate gland is heterogeneous. Other: Small volume free fluid is seen in the pelvis. Extraluminal gas is seen in the midline anterior pelvis and anterior left lower quadrant towards the left groin region. This gas may be extraperitoneal in the preperitoneal fat. Musculoskeletal: No worrisome lytic or sclerotic osseous abnormality. IMPRESSION: 1. 4.0 x 3.4 x 2.5 cm complex fluid collection in the gallbladder fossa containing a small gas bubble. Gas from surgery would not be expected after about 7-10 days. Gas could also be due to the presence of the surgical drain, but infection/evolving abscess in the gallbladder fossa a distinct concern. 2. Edema/inflammation around the splenic flexure of the colon and in the right omentum with fluid and edema around the right liver and tracking towards Morison's pouch. Given the recent surgery and findings in the gallbladder fossa, this is  probably secondary inflammatory change. Focal colitis with edema spreading up under the liver is considered less likely 3. Extraluminal gas in the midline anterior pelvis and anterior left lower quadrant towards the left groin region. Location of this small volume gas suggests that it is extraperitoneal, in the preperitoneal space. Again, while gas after surgery is not typical after about 7-10  days, lack of free gas in the peritoneal cavity makes hollow viscus perforation unlikely. Nevertheless, close clinical follow-up recommended. 4. Small volume free fluid in the pelvis. 5. 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 6.  Aortic Atherosclerosis (ICD10-I70.0). Electronically Signed   By: Kennith Center M.D.   On: 07/20/2023 16:54   DG Chest Port 1 View  Result Date: 07/20/2023 CLINICAL DATA:  Sepsis. Cancer patient. Recent surgery. Known lung mass. EXAM: PORTABLE CHEST 1 VIEW COMPARISON:  X-ray 07/04/2023 and older.  CT angiogram 07/11/2023 FINDINGS: Slight elevation of the left hemidiaphragm. There is some patchy left perihilar opacity with some volume loss which is stable. Fiduciary marker in the peripheral right lung with a spiculated nodule, similar as well. No consolidation, pneumothorax or effusion which is new. Chronic lung changes. Dense likely calcified right apical nodules. Normal cardiopericardial silhouette. Film is rotated to the left. Scattered degenerative changes and osteopenia. Overlapping cardiac leads. IMPRESSION: Stable appearance of the thorax. Fiduciary marker in the right lung with a focal spiculated nodule is stable. Stable bandlike opacity left perihilar with volume loss. Electronically Signed   By: Karen Kays M.D.   On: 07/20/2023 15:40    Anti-infectives: Anti-infectives (From admission, onward)    Start     Dose/Rate Route Frequency Ordered Stop   07/21/23 2200  metroNIDAZOLE (FLAGYL) IVPB 500 mg  Status:  Discontinued        500 mg 100 mL/hr over 60 Minutes Intravenous 2 times daily 07/20/23 1923 07/20/23 2153   07/21/23 1800  vancomycin (VANCOREADY) IVPB 1500 mg/300 mL        1,500 mg 150 mL/hr over 120 Minutes Intravenous Every 24 hours 07/20/23 1930 07/28/23 1759   07/21/23 0600  piperacillin-tazobactam (ZOSYN) IVPB 3.375 g        3.375 g 12.5 mL/hr over 240 Minutes Intravenous  Every 8 hours 07/20/23 2209     07/20/23 2200  ceFEPIme (MAXIPIME) 2 g in sodium chloride 0.9 % 100 mL IVPB  Status:  Discontinued        2 g 200 mL/hr over 30 Minutes Intravenous Every 8 hours 07/20/23 1927 07/20/23 2209   07/20/23 1530  ceFEPIme (MAXIPIME) 2 g in sodium chloride 0.9 % 100 mL IVPB        2 g 200 mL/hr over 30 Minutes Intravenous  Once 07/20/23 1516 07/20/23 1620   07/20/23 1530  metroNIDAZOLE (FLAGYL) IVPB 500 mg        500 mg 100 mL/hr over 60 Minutes Intravenous  Once 07/20/23 1516 07/20/23 1712   07/20/23 1530  vancomycin (VANCOCIN) IVPB 1000 mg/200 mL premix  Status:  Discontinued        1,000 mg 200 mL/hr over 60 Minutes Intravenous  Once 07/20/23 1516 07/20/23 1518   07/20/23 1530  vancomycin (VANCOREADY) IVPB 1500 mg/300 mL        1,500 mg 150 mL/hr over 120 Minutes Intravenous  Once 07/20/23 1518 07/20/23 1840        Assessment/Plan  RUQ fluid collection s/p lap chole -no indication for surgery, afebrile, no peritonitis, WBC  17 from 32 yesterday -drain in place is functional. I have placed IR consult for additional perc drainage of residual abscess in RUQ -leave npo, hold eliquis -agree with abx and resuscitation, lactate normalized (1.4) after IVF and abx -would hold pharm dvt prophy for now until IR decision made -will follow with you    LOS: 1 day   I reviewed nursing notes, hospitalist notes, last 24 h vitals and pain scores, last 48 h intake and output, last 24 h labs and trends, and last 24 h imaging results.  This care required moderate level of medical decision making.   Hosie Spangle, PA-C [facility] Please see Amion for pager number during day hours 7:00am-4:30pm

## 2023-07-21 NOTE — ED Notes (Signed)
Patient states he doesn't feel well enough to stand up or ambulate.

## 2023-07-21 NOTE — ED Notes (Signed)
[facility] arrived for pt transport to [place] 

## 2023-07-21 NOTE — ED Notes (Signed)
Patient transported to CT 

## 2023-07-21 NOTE — Procedures (Signed)
  History:  62 year old male with PMHx of Left MCA stroke, HLD, seizure, who is presenting with weakness and fever. EEG to rule out seizures  EEG classification: Awake and drowsy  Duration: 25 minutes   Technical aspects: This EEG study was done with scalp electrodes positioned according to the 10-20 International system of electrode placement. Electrical activity was reviewed with band pass filter of 1-70Hz , sensitivity of 7 uV/mm, display speed of 39mm/sec with a 60Hz  notched filter applied as appropriate. EEG data were recorded continuously and digitally stored.   Description of the recording: The background rhythms of this recording consists of a fairly well modulated medium amplitude theta activity. Present in the anterior head region is a 15-20 Hz beta activity. Photic stimulation was performed, did not show any abnormalities. Hyperventilation was not performed. No abnormal epileptiform discharges seen during this recording. There was diffuse slowing and left hemispheric focal slowing. There were no electrographic seizure identified.   Abnormality:  Left hemispheric slowing  Mild diffuse slowing   Impression: This is an abnormal EEG recorded while drowsy and awake due to presence of left hemispheric slowing which is consistent with an area of neuronal dysfunction in the left hemisphere and mild diffuse slowing which is consistent with a generalized brain dysfunction nonspecific, such as encephalopathy.    Windell Norfolk, MD [facility]

## 2023-07-22 DIAGNOSIS — A419 Sepsis, unspecified organism: Secondary | ICD-10-CM | POA: Diagnosis not present

## 2023-07-22 DIAGNOSIS — C3411 Malignant neoplasm of upper lobe, right bronchus or lung: Secondary | ICD-10-CM | POA: Diagnosis not present

## 2023-07-22 DIAGNOSIS — Z86711 Personal history of pulmonary embolism: Secondary | ICD-10-CM | POA: Diagnosis not present

## 2023-07-22 DIAGNOSIS — R569 Unspecified convulsions: Secondary | ICD-10-CM | POA: Diagnosis not present

## 2023-07-22 LAB — APTT: aPTT: 45 seconds — ABNORMAL HIGH (ref 24–36)

## 2023-07-22 LAB — HEPARIN LEVEL (UNFRACTIONATED): Heparin Unfractionated: 0.36 IU/mL (ref 0.30–0.70)

## 2023-07-22 LAB — GLUCOSE, CAPILLARY
Glucose-Capillary: 89 mg/dL (ref 70–99)
Glucose-Capillary: 129 mg/dL — ABNORMAL HIGH (ref 70–99)
Glucose-Capillary: 136 mg/dL — ABNORMAL HIGH (ref 70–99)

## 2023-07-22 MED ORDER — HEPARIN (PORCINE) 25000 UT/250ML-% IV SOLN
1350.0000 [IU]/h | INTRAVENOUS | Status: DC
  Administered 2023-07-22 – 2023-07-23 (×2): 1150 [IU]/h via INTRAVENOUS
  Filled 2023-07-22 (×3): qty 250

## 2023-07-22 NOTE — Plan of Care (Signed)
  Problem: Fluid Volume: Goal: Hemodynamic stability will improve Outcome: Progressing   Problem: Clinical Measurements: Goal: Diagnostic test results will improve Outcome: Progressing   Problem: Clinical Measurements: Goal: Signs and symptoms of infection will decrease Outcome: Progressing   Problem: Respiratory: Goal: Ability to maintain adequate ventilation will improve Outcome: Progressing   

## 2023-07-22 NOTE — Evaluation (Addendum)
Clinical/Bedside Swallow Evaluation Patient Details  Name: Todd Estrada MRN: [id number] Date of Birth: 1961-05-07  Today's Date: 07/22/2023 Time: SLP Start Time (ACUTE ONLY): 1110 SLP Stop Time (ACUTE ONLY): 1138 SLP Time Calculation (min) (ACUTE ONLY): 28 min  Past Medical History:  Past Medical History:  Diagnosis Date   Asthma    Atypical chest pain 08/13/2022   Community acquired pneumonia 09/14/2022   COPD (chronic obstructive pulmonary disease) (HCC)    Essential hypertension 08/19/2021   GERD (gastroesophageal reflux disease)    HAP (hospital-acquired pneumonia) 09/16/2022   History of tracheostomy    03/09/22-04/11/22   HLD (hyperlipidemia)    Hypertension    Hypokalemia 08/13/2022   Hypomagnesemia 08/13/2022   Lung cancer (HCC)    PAD (peripheral artery disease) (HCC)    Seizures (HCC) 06/02/2022   Sepsis (HCC) 08/13/2022   Sepsis due to pneumonia (HCC) 04/13/2023   Stroke (HCC) 02/2022   Past Surgical History:  Past Surgical History:  Procedure Laterality Date   BRONCHIAL BIOPSY  05/30/2022   Procedure: BRONCHIAL BIOPSIES;  Surgeon: Leslye Peer, MD;  Location: [facility] ENDOSCOPY;  Service: Pulmonary;;   BRONCHIAL BRUSHINGS  05/30/2022   Procedure: BRONCHIAL BRUSHINGS;  Surgeon: Leslye Peer, MD;  Location: [facility] ENDOSCOPY;  Service: Pulmonary;;   BRONCHIAL NEEDLE ASPIRATION BIOPSY  05/30/2022   Procedure: BRONCHIAL NEEDLE ASPIRATION BIOPSIES;  Surgeon: Leslye Peer, MD;  Location: [facility] ENDOSCOPY;  Service: Pulmonary;;   BRONCHIAL WASHINGS  05/30/2022   Procedure: BRONCHIAL WASHINGS;  Surgeon: Leslye Peer, MD;  Location: [facility] ENDOSCOPY;  Service: Pulmonary;;   CHOLECYSTECTOMY N/A 07/05/2023   Procedure: LAPAROSCOPIC CHOLECYSTECTOMY WITH INTRAOPERATIVE CHOLANGIOGRAM;  Surgeon: Karie Soda, MD;  Location: [facility] ORS;  Service: General;  Laterality: N/A;   ERCP N/A 07/06/2023   Procedure: ENDOSCOPIC RETROGRADE CHOLANGIOPANCREATOGRAPHY (ERCP);  Surgeon: Vida Rigger, MD;   Location: [facility] ENDOSCOPY;  Service: Gastroenterology;  Laterality: N/A;   ESOPHAGOGASTRODUODENOSCOPY (EGD) WITH PROPOFOL N/A 03/11/2022   Procedure: ESOPHAGOGASTRODUODENOSCOPY (EGD) WITH PROPOFOL;  Surgeon: Violeta Gelinas, MD;  Location: [facility] ENDOSCOPY;  Service: General;  Laterality: N/A;   FIDUCIAL MARKER PLACEMENT  05/30/2022   Procedure: FIDUCIAL MARKER PLACEMENT;  Surgeon: Leslye Peer, MD;  Location[facility] ENDOSCOPY;  Service: Pulmonary;;   IR ANGIO INTRA EXTRACRAN SEL COM CAROTID INNOMINATE UNI R MOD SED  03/02/2022   IR CT HEAD LTD  03/02/2022   IR PERCUTANEOUS ART THROMBECTOMY/INFUSION INTRACRANIAL INC DIAG ANGIO  03/02/2022   PEG PLACEMENT N/A 03/11/2022   Procedure: PERCUTANEOUS ENDOSCOPIC GASTROSTOMY (PEG) PLACEMENT;  Surgeon: Violeta Gelinas, MD;  Location: [facility] ENDOSCOPY;  Service: General;  Laterality: N/A;   RADIOLOGY WITH ANESTHESIA N/A 03/02/2022   Procedure: IR WITH ANESTHESIA;  Surgeon: Julieanne Cotton, MD;  Location: [facility] OR;  Service: Radiology;  Laterality: N/A;   REMOVAL OF STONES  07/06/2023   Procedure: REMOVAL OF STONES;  Surgeon: Vida Rigger, MD;  Location: [facility] ENDOSCOPY;  Service: Gastroenterology;;   Dennison Mascot  07/06/2023   Procedure: Dennison Mascot;  Surgeon: Vida Rigger, MD;  Location: [facility] ENDOSCOPY;  Service: Gastroenterology;;   VIDEO BRONCHOSCOPY WITH RADIAL ENDOBRONCHIAL ULTRASOUND  05/30/2022   Procedure: VIDEO BRONCHOSCOPY WITH RADIAL ENDOBRONCHIAL ULTRASOUND;  Surgeon: Leslye Peer, MD;  Location: [facility] ENDOSCOPY;  Service: Pulmonary;;   HPI:  62 y.o. 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     Admitted for  septic shock secondary to  postsurgical infection; pt seen by ST 4/24 with recommendation for Dysphagia 3/thin liquids;chronic aphasia.  CXR on 07/20/23 revealed Stable appearance of the thorax. Fiduciary marker  in the right lung with a focal spiculated nodule is stable. Stable bandlike opacity left perihilar with volume loss.  ST consulted for swallow evaluation.    Assessment / Plan / Recommendation  Clinical Impression  Pt seen for clinical swallowing evaluation with oropharyngeal dysphagia noted c/b slight oral holding, prolonged mastication with solids (pt is edentulous) and audible swallow noted with larger boluses of liquids via straw.  Pt exhibited generalized oral weakness/incoordination and R facial weakness during OME.  Min verbal cues required for pt to consume smaller amounts of liquids.  No overt s/sx of aspiration present during this evaluation, but recommend initiating a conservative diet of Dysphagia 2(minced)/thin liquids with ST f/u briefly for diet tolerance and potential advancement to prior diet of Dysphagia 3(mechanical soft)/thin liquids d/t deconditioning/hx of dysphagia/prior CVA.   Medications may be beneficial given whole in puree for safety purposes.  Thank you for this consult. SLP Visit Diagnosis: Dysphagia, unspecified (R13.10)    Aspiration Risk  Mild aspiration risk    Diet Recommendation   Thin;Dysphagia 2 (chopped)  Medication Administration: Whole meds with puree    Other  Recommendations Oral Care Recommendations: Oral care BID;Staff/trained caregiver to provide oral care    Recommendations for follow up therapy are one component of a multi-disciplinary discharge planning process, led by the attending physician.  Recommendations may be updated based on patient status, additional functional criteria and insurance authorization.  Follow up Recommendations Follow physician's recommendations for discharge plan and follow up therapies      Assistance Recommended at Discharge  TBD  Functional Status Assessment Patient has had a recent decline in their functional status and demonstrates the ability to make significant improvements in function in a reasonable and  predictable amount of time.  Frequency and Duration min 1 x/week  1 week       Prognosis Prognosis for improved oropharyngeal function: Good      Swallow Study   General Date of Onset: 07/20/23 HPI: 62 y.o. 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     Admitted for  septic shock secondary to postsurgical infection; pt seen by ST 4/24 with recommendation for Dysphagia 3/thin liquids;chronic aphasia.  ST consulted for swallow evaluation. Type of Study: Bedside Swallow Evaluation Previous Swallow Assessment: 04/13/23 with recs for Dysphagia 3/thin liquids Diet Prior to this Study: NPO Temperature Spikes Noted: Yes (low grade, 99) Respiratory Status: Nasal cannula;Other (comment) (nursing stated he requires oxygen nocturnally or with naps, but not consistently) History of Recent Intubation: No Behavior/Cognition: Alert;Cooperative;Requires cueing Oral Cavity Assessment: Dry Oral Care Completed by SLP: Yes Oral Cavity - Dentition: Edentulous Vision: Functional for self-feeding Self-Feeding Abilities: Needs assist Patient Positioning: Upright in bed Baseline Vocal Quality: Low vocal intensity Volitional Cough: Strong Volitional Swallow: Unable to elicit    Oral/Motor/Sensory Function Overall Oral Motor/Sensory Function: Generalized oral weakness   Ice Chips Ice chips: Impaired Presentation: Spoon Oral Phase Functional Implications: Oral holding   Thin Liquid Thin Liquid: Impaired Presentation: Cup;Straw Oral Phase Functional Implications: Oral holding Pharyngeal  Phase Impairments:  (audible swallow)    Nectar Thick Nectar Thick Liquid: Not tested   Honey Thick Honey Thick Liquid: Not tested   Puree Puree: Impaired Presentation: Self Fed Oral  Phase Impairments: Reduced lingual movement/coordination   Solid     Solid:  Impaired Presentation: Self Fed Oral Phase Impairments: Impaired mastication;Reduced lingual movement/coordination Oral Phase Functional Implications: Impaired mastication;Prolonged oral transit Pharyngeal Phase Impairments: Other (comments) (liquid wash required)      Pat ,M.S., CCC-SLP 07/22/2023,1:42 PM

## 2023-07-22 NOTE — Plan of Care (Signed)

## 2023-07-22 NOTE — Progress Notes (Signed)
PROGRESS NOTE        PATIENT DETAILS Name: Todd Estrada Age: 62 y.o. Sex: male Date of Birth: [birth year] Admit Date: 07/20/2023 Admitting Physician Therisa Doyne, MD EXB:[id number], [name], MD  Brief Summary: Patient is a 62 y.o.  male with history of prior CVA with right-sided hemiparesis, seizure disorder, non-small cell carcinoma (squamous cell)-s/p chemo/radiation-currently on observation-with recent hospitalization from 7/16-7/21 for acute cholecystitis with choledocholithiasis-s/p ERCP and laparoscopic cholecystectomy-surgical drain was left in place-presented to the hospital on 8/1 with fever/RUQ pain-found to have sepsis-secondary to RUQ fluid collection (probably residual abscess)-along with twitching of left face/left side of the body concerning for breakthrough seizures-patient was  subsequently admitted to the hospitalist service.  Significant events: 8/1>> admit to [facility]  Significant studies: 8/1>> CXR: Stable appearance of the thorax-no obvious PNA. 8/1>> CT abdomen/pelvis: 4.0 x 3.4 x 2.5 cm fluid collection in the gallbladder fossa. 8/2>> CT head: No acute abnormality  Significant microbiology data: 8/1>> COVID PCR/influenza PCR/RSV PCR: Negative 8/1>> urine culture:> 100,000 colonies/mL gram-positive cocci 8/1>> blood culture: No growth  Procedures: None  Consults: Neurology IR CCS  Subjective: Lying comfortably in bed-denies any chest pain or shortness of breath.  Objective: Vitals: Blood pressure 91/61, pulse 83, temperature 98.5 F (36.9 C), temperature source Oral, resp. rate 19, height 6' (1.829 m), weight 74.8 kg, SpO2 95%.   Exam: Gen Exam:Alert awake-not in any distress HEENT:atraumatic, normocephalic Chest: B/L clear to auscultation anteriorly CVS:S1S2 regular Abdomen:soft non tender, non distended Extremities:no edema Neurology: Non focal Skin: no rash  Pertinent Labs/Radiology:    Latest Ref Rng & Units  07/22/2023    3:57 AM 07/21/2023    5:06 AM 07/20/2023    3:38 PM  CBC  WBC 4.0 - 10.5 K/uL 9.6  17.6    Hemoglobin 13.0 - 17.0 g/dL 44.0  10.2  72.5   Hematocrit 39.0 - 52.0 % 32.7  34.9  48.0   Platelets 150 - 400 K/uL 182  232      Lab Results  Component Value Date   NA 135 07/22/2023   K 3.9 07/22/2023   CL 101 07/22/2023   CO2 26 07/22/2023    Assessment/Plan: Severe sepsis due to residual abscess in gallbladder fossa-recent history of laparoscopic cholecystectomy Sepsis physiology improved Empiric Vanco/Zosyn Cultures negative so far Per IR-no window to safely drain abscess-plan is to continue antibiotics and reimage in the next several days CCS following.  Recrudescence of his underlying right-sided weakness/aphasia-in the setting of acute illness/sepsis Facial twitching-not felt to have seizures (EEG negative) No facial twitching observed this morning-right-sided weakness appears stable and close to baseline Neurology recommending continuing Keppra-Per neurology-these issues should improve further as patient's underlying medical condition improves.    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 Shirline Frees  Debility/deconditioning PT/OT-probably will require SNF  BMI Estimated body mass index is 22.38 kg/m as calculated from the following:   Height as of this encounter: 6' (1.829 m).   Weight as of this encounter: 74.8 kg.   Code  status:   Code Status: DNR   DVT Prophylaxis: SCDs Start: 07/21/23 0042   Family Communication: None at bedside   Disposition Plan: Status is:  Inpatient Remains inpatient appropriate because: Severity of illness   Planned Discharge Destination:Skilled nursing facility   Diet: Diet Order             Diet NPO time specified  Diet effective now                     Antimicrobial agents: Anti-infectives (From admission, onward)    Start     Dose/Rate Route Frequency Ordered Stop   07/21/23 2200  metroNIDAZOLE (FLAGYL) IVPB 500 mg  Status:  Discontinued        500 mg 100 mL/hr over 60 Minutes Intravenous 2 times daily 07/20/23 1923 07/20/23 2153   07/21/23 1800  vancomycin (VANCOREADY) IVPB 1500 mg/300 mL  Status:  Discontinued        1,500 mg 150 mL/hr over 120 Minutes Intravenous Every 24 hours 07/20/23 1930 07/21/23 1405   07/21/23 1600  vancomycin (VANCOCIN) IVPB 1000 mg/200 mL premix        1,000 mg 200 mL/hr over 60 Minutes Intravenous Every 12 hours 07/21/23 1405     07/21/23 0600  piperacillin-tazobactam (ZOSYN) IVPB 3.375 g        3.375 g 12.5 mL/hr over 240 Minutes Intravenous Every 8 hours 07/20/23 2209     07/20/23 2200  ceFEPIme (MAXIPIME) 2 g in sodium chloride 0.9 % 100 mL IVPB  Status:  Discontinued        2 g 200 mL/hr over 30 Minutes Intravenous Every 8 hours 07/20/23 1927 07/20/23 2209   07/20/23 1530  ceFEPIme (MAXIPIME) 2 g in sodium chloride 0.9 % 100 mL IVPB        2 g 200 mL/hr over 30 Minutes Intravenous  Once 07/20/23 1516 07/20/23 1620   07/20/23 1530  metroNIDAZOLE (FLAGYL) IVPB 500 mg        500 mg 100 mL/hr over 60 Minutes Intravenous  Once 07/20/23 1516 07/20/23 1712   07/20/23 1530  vancomycin (VANCOCIN) IVPB 1000 mg/200 mL premix  Status:  Discontinued        1,000 mg 200 mL/hr over 60 Minutes Intravenous  Once 07/20/23 1516 07/20/23 1518   07/20/23 1530  vancomycin (VANCOREADY) IVPB 1500 mg/300 mL        1,500 mg 150 mL/hr over 120 Minutes Intravenous  Once 07/20/23 1518 07/20/23 1840        MEDICATIONS: Scheduled Meds:  atorvastatin  40 mg Oral Daily   midodrine  5 mg  Oral BID WC   pantoprazole  40 mg Oral Daily   Continuous Infusions:  sodium chloride 100 mL/hr at 07/22/23 0328   levETIRAcetam 500 mg (07/22/23 0811)   piperacillin-tazobactam (ZOSYN)  IV 3.375 g (07/22/23 0524)   vancomycin 1,000 mg (07/22/23 0525)   PRN Meds:.acetaminophen **OR** acetaminophen, fentaNYL (SUBLIMAZE) injection, ondansetron **OR** ondansetron (ZOFRAN) IV   I have personally reviewed following labs and imaging studies  LABORATORY DATA: CBC: Recent Labs  Lab 07/20/23 1517 07/20/23 1538 07/21/23 0506 07/22/23 0357  WBC 32.4*  --  17.6* 9.6  NEUTROABS 29.4*  --  15.7*  --   HGB 14.1 16.3 10.8* 10.4*  HCT 45.9 48.0 34.9* 32.7*  MCV 94.3  --  93.8 90.6  PLT 392  --  232 182    Basic Metabolic Panel: Recent Labs  Lab 07/20/23 1517 07/20/23 1538 07/20/23  1948 07/21/23 0506 07/22/23 0357  NA 138 138  --  135 135  K 4.5 4.5  --  3.7 3.9  CL 100 100  --  99 101  CO2 23  --   --  27 26  GLUCOSE 168* 165*  --  123* 96  BUN 22 21  --  16 9  CREATININE 1.32* 1.20  --  0.86 0.90  CALCIUM 9.5  --   --  8.5* 8.3*  MG  --   --  1.6* 2.0 1.9  PHOS  --   --  2.8 3.4 3.3    GFR: Estimated Creatinine Clearance: 88.9 mL/min (by C-G formula based on SCr of 0.9 mg/dL).  Liver Function Tests: Recent Labs  Lab 07/20/23 1517 07/21/23 0506  AST 22 15  ALT 18 14  ALKPHOS 80 59  BILITOT 0.9 0.7  PROT 8.1 6.2*  ALBUMIN 3.8 2.8*   Recent Labs  Lab 07/20/23 1517  LIPASE 28   No results for input(s): "AMMONIA" in the last 168 hours.  Coagulation Profile: Recent Labs  Lab 07/20/23 1517  INR 1.4*    Cardiac Enzymes: Recent Labs  Lab 07/20/23 1948  CKTOTAL 22*    BNP (last 3 results) No results for input(s): "PROBNP" in the last 8760 hours.  Lipid Profile: No results for input(s): "CHOL", "HDL", "LDLCALC", "TRIG", "CHOLHDL", "LDLDIRECT" in the last 72 hours.  Thyroid Function Tests: Recent Labs    07/20/23 1948  TSH 3.157    Anemia  Panel: Recent Labs    07/20/23 1948 07/20/23 1955 07/21/23 0506  VITAMINB12  --   --  396  FOLATE  --   --  21.9  FERRITIN  --  378*  --   TIBC  --  253  --   IRON  --  20*  --   RETICCTPCT 1.4  --   --     Urine analysis:    Component Value Date/Time   COLORURINE YELLOW 07/20/2023 1808   APPEARANCEUR CLEAR 07/20/2023 1808   LABSPEC 1.032 (H) 07/20/2023 1808   PHURINE 6.0 07/20/2023 1808   GLUCOSEU NEGATIVE 07/20/2023 1808   HGBUR NEGATIVE 07/20/2023 1808   BILIRUBINUR NEGATIVE 07/20/2023 1808   KETONESUR NEGATIVE 07/20/2023 1808   PROTEINUR 30 (A) 07/20/2023 1808   NITRITE NEGATIVE 07/20/2023 1808   LEUKOCYTESUR NEGATIVE 07/20/2023 1808    Sepsis Labs: Lactic Acid, Venous    Component Value Date/Time   LATICACIDVEN 1.4 07/20/2023 2359    MICROBIOLOGY: Recent Results (from the past 240 hour(s))  Blood Culture (routine x 2)     Status: None (Preliminary result)   Collection Time: 07/20/23  3:17 PM   Specimen: BLOOD  Result Value Ref Range Status   Specimen Description   Final    BLOOD LEFT ANTECUBITAL Performed at [facility], [address], [place], [place] [address]    Special Requests   Final    BOTTLES DRAWN AEROBIC AND ANAEROBIC Blood Culture adequate volume Performed at [facility], [address], [place], [place] [address]    Culture   Final    NO GROWTH 2 DAYS Performed at [facility], [address] [place], [place] [address]    Report Status PENDING  Incomplete  Resp panel by RT-PCR (RSV, Flu A&B, Covid) Anterior Nasal Swab     Status: None   Collection Time: 07/20/23  3:21 PM   Specimen: Anterior Nasal Swab  Result Value Ref Range Status  SARS Coronavirus 2 by RT PCR NEGATIVE NEGATIVE Final    Comment: (NOTE) SARS-CoV-2 target nucleic acids are NOT DETECTED.  The SARS-CoV-2 RNA is generally detectable in upper respiratory specimens during the acute phase of infection. The  lowest concentration of SARS-CoV-2 viral copies this assay can detect is 138 copies/mL. A negative result does not preclude SARS-Cov-2 infection and should not be used as the sole basis for treatment or other patient management decisions. A negative result may occur with  improper specimen collection/handling, submission of specimen other than nasopharyngeal swab, presence of viral mutation(s) within the areas targeted by this assay, and inadequate number of viral copies(<138 copies/mL). A negative result must be combined with clinical observations, patient history, and epidemiological information. The expected result is Negative.  Fact Sheet for Patients:  BloggerCourse.com  Fact Sheet for Healthcare Providers:  SeriousBroker.it  This test is no t yet approved or cleared by the [place] FDA and  has been authorized for detection and/or diagnosis of SARS-CoV-2 by FDA under an Emergency Use Authorization (EUA). This EUA will remain  in effect (meaning this test can be used) for the duration of the COVID-19 declaration under Section 564(b)(1) of the Act, 21 U.S.C.section 360bbb-3(b)(1), unless the authorization is terminated  or revoked sooner.       Influenza A by PCR NEGATIVE NEGATIVE Final   Influenza B by PCR NEGATIVE NEGATIVE Final    Comment: (NOTE) The Xpert Xpress SARS-CoV-2/FLU/RSV plus assay is intended as an aid in the diagnosis of influenza from Nasopharyngeal swab specimens and should not be used as a sole basis for treatment. Nasal washings and aspirates are unacceptable for Xpert Xpress SARS-CoV-2/FLU/RSV testing.  Fact Sheet for Patients: BloggerCourse.com  Fact Sheet for Healthcare Providers: SeriousBroker.it  This test is not yet approved or cleared by the [place] FDA and has been authorized for detection and/or diagnosis of SARS-CoV-2 by FDA under  an Emergency Use Authorization (EUA). This EUA will remain in effect (meaning this test can be used) for the duration of the COVID-19 declaration under Section 564(b)(1) of the Act, 21 U.S.C. section 360bbb-3(b)(1), unless the authorization is terminated or revoked.     Resp Syncytial Virus by PCR NEGATIVE NEGATIVE Final    Comment: (NOTE) Fact Sheet for Patients: BloggerCourse.com  Fact Sheet for Healthcare Providers: SeriousBroker.it  This test is not yet approved or cleared by the [place] FDA and has been authorized for detection and/or diagnosis of SARS-CoV-2 by FDA under an Emergency Use Authorization (EUA). This EUA will remain in effect (meaning this test can be used) for the duration of the COVID-19 declaration under Section 564(b)(1) of the Act, 21 U.S.C. section 360bbb-3(b)(1), unless the authorization is terminated or revoked.  Performed at [facility], [address], [place], [place] [address]   Blood Culture (routine x 2)     Status: None (Preliminary result)   Collection Time: 07/20/23  3:21 PM   Specimen: BLOOD LEFT HAND  Result Value Ref Range Status   Specimen Description   Final    BLOOD LEFT HAND Performed at [facility], [address] [place], [place] [address]    Special Requests   Final    BOTTLES DRAWN AEROBIC AND ANAEROBIC Blood Culture adequate volume Performed at [facility], [address] [place], [place] [address]    Culture   Final    NO GROWTH 2 DAYS Performed at [facility], [address] [place], [place]  [address]    Report Status PENDING  Incomplete  Urine Culture (for pregnant, neutropenic or urologic patients or patients with an indwelling urinary catheter)     Status: Abnormal (Preliminary result)   Collection Time: 07/20/23  7:23 PM   Specimen: Urine, Clean Catch  Result Value Ref Range Status   Specimen Description    Final    URINE, CLEAN CATCH Performed at [facility], [address], [place], [place] [address]    Special Requests   Final    NONE Performed at [facility], [address] [place], [place] [address]    Culture (A)  Final    >=100,000 COLONIES/mL GRAM POSITIVE COCCI IDENTIFICATION AND SUSCEPTIBILITIES TO FOLLOW Performed at [facility], [address], [place], [place] [address]    Report Status PENDING  Incomplete    RADIOLOGY STUDIES/RESULTS: EEG adult  Result Date: 07/21/2023 Windell Norfolk, MD     07/21/2023  4:55 PM History: 62 year old male with PMHx of Left MCA stroke, HLD, seizure, who is presenting with weakness and fever. EEG to rule out seizures EEG classification: Awake and drowsy Duration: 25 minutes Technical aspects: This EEG study was done with scalp electrodes positioned according to the 10-20 International system of electrode placement. Electrical activity was reviewed with band pass filter of 1-70Hz , sensitivity of 7 uV/mm, display speed of 76mm/sec with a 60Hz  notched filter applied as appropriate. EEG data were recorded continuously and digitally stored. Description of the recording: The background rhythms of this recording consists of a fairly well modulated medium amplitude theta activity. Present in the anterior head region is a 15-20 Hz beta activity. Photic stimulation was performed, did not show any abnormalities. Hyperventilation was not performed. No abnormal epileptiform discharges seen during this recording. There was diffuse slowing and left hemispheric focal slowing. There were no electrographic seizure identified. Abnormality: Left hemispheric slowing Mild diffuse slowing Impression: This is an abnormal EEG recorded while drowsy and awake due to presence of left hemispheric slowing which is consistent with an area of neuronal dysfunction in the left hemisphere and mild diffuse slowing which is consistent with a  generalized brain dysfunction nonspecific, such as encephalopathy. Windell Norfolk, MD [facility]   CT HEAD WO CONTRAST ( )  Result Date: 07/21/2023 CLINICAL DATA:  Seizure, focal (Ped 0-17y) EXAM: CT HEAD WITHOUT CONTRAST TECHNIQUE: Contiguous axial images were obtained from the base of the skull through the vertex without intravenous contrast. RADIATION DOSE REDUCTION: This exam was performed according to the departmental dose-optimization program which includes automated exposure control, adjustment of the mA and/or kV according to patient size and/or use of iterative reconstruction technique. COMPARISON:  CT head 07/04/2023 FINDINGS: Brain: Patchy and confluent areas of decreased attenuation are noted throughout the deep and periventricular white matter of the cerebral hemispheres bilaterally, compatible with chronic microvascular ischemic disease. Similar-appearing chronic left MCA territory and left cerebellar infarctions. No evidence of large-territorial acute infarction. No parenchymal hemorrhage. No mass lesion. No extra-axial collection. No mass effect or midline shift. No hydrocephalus. Basilar cisterns are patent. Vascular: No hyperdense vessel. Skull: No acute fracture or focal lesion. Sinuses/Orbits: Almost complete opacification of the right maxillary sinus fluid CT of the sinuses of chronic infection. Otherwise paranasal sinuses and mastoid air cells are clear. The orbits are unremarkable. Other: None. IMPRESSION: No acute intracranial abnormality in a patient with similar-appearing chronic left MCA territory and left cerebellar infarctions. If high clinical suspicion for superimposed acute infarction, please consider  MRI brain noncontrast for further evaluation. Electronically Signed   By: Tish Frederickson M.D.   On: 07/21/2023 03:41   CT ABDOMEN PELVIS W CONTRAST  Result Date: 07/20/2023 CLINICAL DATA:  Status post laparoscopic cholecystectomy for acute on chronic calculous  cholecystitis 07/05/2023. EXAM: CT ABDOMEN AND PELVIS WITH CONTRAST TECHNIQUE: Multidetector CT imaging of the abdomen and pelvis was performed using the standard protocol following bolus administration of intravenous contrast. RADIATION DOSE REDUCTION: This exam was performed according to the departmental dose-optimization program which includes automated exposure control, adjustment of the mA and/or kV according to patient size and/or use of iterative reconstruction technique. CONTRAST:  80mL OMNIPAQUE IOHEXOL 300 MG/ML  SOLN COMPARISON:  07/12/2023 FINDINGS: Lower chest: Trace atelectasis or scarring noted dependent left lung base. Hepatobiliary: No suspicious focal abnormality within the liver parenchyma. 4.0 x 3.4 x 2.5 cm complex fluid collection is identified in the gallbladder fossa containing a small gas bubble (axial 33/2 and coronal 100/7). The tip of a surgical drain is positioned in the posterior gallbladder fossa adjacent to the surgical clips coursing along the posteroinferior liver margin and out the right anterior abdominal wall. Pancreas: No focal mass lesion. No dilatation of the main duct. No intraparenchymal cyst. No peripancreatic edema. Spleen: No splenomegaly. No suspicious focal mass lesion. Adrenals/Urinary Tract: No adrenal nodule or mass. Kidneys unremarkable. No evidence for hydroureter. The urinary bladder appears normal for the degree of distention. Stomach/Bowel: Stomach is unremarkable. No gastric wall thickening. No evidence of outlet obstruction. Duodenum is normally positioned as is the ligament of Treitz. No small bowel wall thickening. No small bowel dilatation. The terminal ileum is normal. The appendix is normal. There is some wall thickening in the splenic flexure of the colon as it courses under the liver. Edema/inflammation is seen around the splenic flexure of the colon and in the right omentum with fluid and edema around the right liver and tracking towards Morison's  pouch. Left colon unremarkable. Vascular/Lymphatic: There is moderate atherosclerotic calcification of the abdominal aorta without aneurysm. 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 There is no gastrohepatic or hepatoduodenal ligament lymphadenopathy. No retroperitoneal or mesenteric lymphadenopathy. No pelvic sidewall lymphadenopathy. Reproductive: Prostate gland is heterogeneous. Other: Small volume free fluid is seen in the pelvis. Extraluminal gas is seen in the midline anterior pelvis and anterior left lower quadrant towards the left groin region. This gas may be extraperitoneal in the preperitoneal fat. Musculoskeletal: No worrisome lytic or sclerotic osseous abnormality. IMPRESSION: 1. 4.0 x 3.4 x 2.5 cm complex fluid collection in the gallbladder fossa containing a small gas bubble. Gas from surgery would not be expected after about 7-10 days. Gas could also be due to the presence of the surgical drain, but infection/evolving abscess in the gallbladder fossa a distinct concern. 2. Edema/inflammation around the splenic flexure of the colon and in the right omentum with fluid and edema around the right liver and tracking towards Morison's pouch. Given the recent surgery and findings in the gallbladder fossa, this is probably secondary inflammatory change. Focal colitis with edema spreading up under the liver is considered less likely 3. Extraluminal gas in the midline anterior pelvis and anterior left lower quadrant towards the left groin region. Location of this small volume gas suggests that it is extraperitoneal, in the preperitoneal space. Again, while gas after surgery is not typical after about 7-10 days, lack of free gas in the peritoneal cavity makes hollow viscus perforation unlikely.  Nevertheless, close clinical follow-up recommended. 4. Small volume free fluid in the pelvis. 5. Chronic occlusion left common iliac artery with  stenosis of the right common iliac artery, potentially flow limiting. 6.  Aortic Atherosclerosis (ICD10-I70.0). Electronically Signed   By: Kennith Center M.D.   On: 07/20/2023 16:54   DG Chest Port 1 View  Result Date: 07/20/2023 CLINICAL DATA:  Sepsis. Cancer patient. Recent surgery. Known lung mass. EXAM: PORTABLE CHEST 1 VIEW COMPARISON:  X-ray 07/04/2023 and older.  CT angiogram 07/11/2023 FINDINGS: Slight elevation of the left hemidiaphragm. There is some patchy left perihilar opacity with some volume loss which is stable. Fiduciary marker in the peripheral right lung with a spiculated nodule, similar as well. No consolidation, pneumothorax or effusion which is new. Chronic lung changes. Dense likely calcified right apical nodules. Normal cardiopericardial silhouette. Film is rotated to the left. Scattered degenerative changes and osteopenia. Overlapping cardiac leads. IMPRESSION: Stable appearance of the thorax. Fiduciary marker in the right lung with a focal spiculated nodule is stable. Stable bandlike opacity left perihilar with volume loss. Electronically Signed   By: Karen Kays M.D.   On: 07/20/2023 15:40     LOS: 2 days   Jeoffrey Massed, MD  [facility]    To contact the attending provider between 7A-7P or the covering provider during after hours 7P-7A, please log into the web site [email or web address] and access using [place] password for that web site. If you do not have the password, please call the hospital operator.  07/22/2023, 9:52 AM

## 2023-07-22 NOTE — Progress Notes (Signed)
Received page from [facility] about this patient. We will pick up at 7am tomorrow.

## 2023-07-22 NOTE — Progress Notes (Signed)
Patient ID: Todd Estrada, male   DOB: [birth year], 62 y.o.   MRN: [id number]      Subjective: Denies abd pain Transferred to [facility] from [facility] for SZs ROS negative except as listed above. Objective: Vital signs in last 24 hours: Temp:  [97 F (36.1 C)-99.9 F (37.7 C)] 98.5 F (36.9 C) (08/03 0504) Pulse Rate:  [74-93] 83 (08/03 0504) Resp:  [19-23] 19 (08/03 0504) BP: (86-127)/(57-73) 91/61 (08/03 0504) SpO2:  [92 %-97 %] 95 % (08/03 0504)    Intake/Output from previous day: 08/02 0701 - 08/03 0700 In: 1550 [I.V.:1000; IV Piggyback:550] Out: 820 [Urine:800; Drains:20] Intake/Output this shift: No intake/output data recorded.  General appearance: alert and cooperative GI: soft, not sig tender, drain purulent Neuro: alert and F/C  Lab Results: CBC  Recent Labs    07/21/23 0506 07/22/23 0357  WBC 17.6* 9.6  HGB 10.8* 10.4*  HCT 34.9* 32.7*  PLT 232 182   BMET Recent Labs    07/21/23 0506 07/22/23 0357  NA 135 135  K 3.7 3.9  CL 99 101  CO2 27 26  GLUCOSE 123* 96  BUN 16 9  CREATININE 0.86 0.90  CALCIUM 8.5* 8.3*   PT/INR Recent Labs    07/20/23 1517  LABPROT 17.4*  INR 1.4*   ABG Recent Labs    07/20/23 1948  HCO3 24.7    Studies/Results: EEG adult  Result Date: 07/21/2023 Windell Norfolk, MD     07/21/2023  4:55 PM History: 62 year old male with PMHx of Left MCA stroke, HLD, seizure, who is presenting with weakness and fever. EEG to rule out seizures EEG classification: Awake and drowsy Duration: 25 minutes Technical aspects: This EEG study was done with scalp electrodes positioned according to the 10-20 International system of electrode placement. Electrical activity was reviewed with band pass filter of 1-70Hz , sensitivity of 7 uV/mm, display speed of 54mm/sec with a 60Hz  notched filter applied as appropriate. EEG data were recorded continuously and digitally stored. Description of the recording: The background rhythms of this recording consists of a fairly  well modulated medium amplitude theta activity. Present in the anterior head region is a 15-20 Hz beta activity. Photic stimulation was performed, did not show any abnormalities. Hyperventilation was not performed. No abnormal epileptiform discharges seen during this recording. There was diffuse slowing and left hemispheric focal slowing. There were no electrographic seizure identified. Abnormality: Left hemispheric slowing Mild diffuse slowing Impression: This is an abnormal EEG recorded while drowsy and awake due to presence of left hemispheric slowing which is consistent with an area of neuronal dysfunction in the left hemisphere and mild diffuse slowing which is consistent with a generalized brain dysfunction nonspecific, such as encephalopathy. Windell Norfolk, MD [facility]   CT HEAD WO CONTRAST ( )  Result Date: 07/21/2023 CLINICAL DATA:  Seizure, focal (Ped 0-17y) EXAM: CT HEAD WITHOUT CONTRAST TECHNIQUE: Contiguous axial images were obtained from the base of the skull through the vertex without intravenous contrast. RADIATION DOSE REDUCTION: This exam was performed according to the departmental dose-optimization program which includes automated exposure control, adjustment of the mA and/or kV according to patient size and/or use of iterative reconstruction technique. COMPARISON:  CT head 07/04/2023 FINDINGS: Brain: Patchy and confluent areas of decreased attenuation are noted throughout the deep and periventricular white matter of the cerebral hemispheres bilaterally, compatible with chronic microvascular ischemic disease. Similar-appearing chronic left MCA territory and left cerebellar infarctions. No evidence of large-territorial acute infarction. No parenchymal hemorrhage. No  mass lesion. No extra-axial collection. No mass effect or midline shift. No hydrocephalus. Basilar cisterns are patent. Vascular: No hyperdense vessel. Skull: No acute fracture or focal lesion. Sinuses/Orbits:  Almost complete opacification of the right maxillary sinus fluid CT of the sinuses of chronic infection. Otherwise paranasal sinuses and mastoid air cells are clear. The orbits are unremarkable. Other: None. IMPRESSION: No acute intracranial abnormality in a patient with similar-appearing chronic left MCA territory and left cerebellar infarctions. If high clinical suspicion for superimposed acute infarction, please consider MRI brain noncontrast for further evaluation. Electronically Signed   By: Tish Frederickson M.D.   On: 07/21/2023 03:41   CT ABDOMEN PELVIS W CONTRAST  Result Date: 07/20/2023 CLINICAL DATA:  Status post laparoscopic cholecystectomy for 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 EXAM: CT ABDOMEN AND PELVIS WITH CONTRAST TECHNIQUE: Multidetector CT imaging of the abdomen and pelvis was performed using the standard protocol following bolus administration of intravenous contrast. RADIATION DOSE REDUCTION: This exam was performed according to the departmental dose-optimization program which includes automated exposure control, adjustment of the mA and/or kV according to patient size and/or use of iterative reconstruction technique. CONTRAST:  80mL OMNIPAQUE IOHEXOL 300 MG/ML  SOLN COMPARISON:  07/12/2023 FINDINGS: Lower chest: Trace atelectasis or scarring noted dependent left lung base. Hepatobiliary: No suspicious focal abnormality within the liver parenchyma. 4.0 x 3.4 x 2.5 cm complex fluid collection is identified in the gallbladder fossa containing a small gas bubble (axial 33/2 and coronal 100/7). The tip of a surgical drain is positioned in the posterior gallbladder fossa adjacent to the surgical clips coursing along the posteroinferior liver margin and out the right anterior abdominal wall. Pancreas: No focal mass lesion. No dilatation of the main duct. No intraparenchymal cyst. No peripancreatic edema. Spleen: No splenomegaly. No suspicious focal mass lesion. Adrenals/Urinary  Tract: No adrenal nodule or mass. Kidneys unremarkable. No evidence for hydroureter. The urinary bladder appears normal for the degree of distention. Stomach/Bowel: Stomach is unremarkable. No gastric wall thickening. No evidence of outlet obstruction. Duodenum is normally positioned as is the ligament of Treitz. No small bowel wall thickening. No small bowel dilatation. The terminal ileum is normal. The appendix is normal. There is some wall thickening in the splenic flexure of the colon as it courses under the liver. Edema/inflammation is seen around the splenic flexure of the colon and in the right omentum with fluid and edema around the right liver and tracking towards Morison's pouch. Left colon unremarkable. Vascular/Lymphatic: There is moderate atherosclerotic calcification of the abdominal aorta without aneurysm. 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 There is no gastrohepatic or hepatoduodenal ligament lymphadenopathy. No retroperitoneal or mesenteric lymphadenopathy. No pelvic sidewall lymphadenopathy. Reproductive: Prostate gland is heterogeneous. Other: Small volume free fluid is seen in the pelvis. Extraluminal gas is seen in the midline anterior pelvis and anterior left lower quadrant towards the left groin region. This gas may be extraperitoneal in the preperitoneal fat. Musculoskeletal: No worrisome lytic or sclerotic osseous abnormality. IMPRESSION: 1. 4.0 x 3.4 x 2.5 cm complex fluid collection in the gallbladder fossa containing a small gas bubble. Gas from surgery would not be expected after about 7-10 days. Gas could also be due to the presence of the surgical drain, but infection/evolving abscess in the gallbladder fossa a distinct concern. 2. Edema/inflammation around the splenic flexure of the colon and in the right omentum with fluid and edema around the right liver and tracking towards Morison's  pouch. Given the  recent surgery and findings in the gallbladder fossa, this is probably secondary inflammatory change. Focal colitis with edema spreading up under the liver is considered less likely 3. Extraluminal gas in the midline anterior pelvis and anterior left lower quadrant towards the left groin region. Location of this small volume gas suggests that it is extraperitoneal, in the preperitoneal space. Again, while gas after surgery is not typical after about 7-10 days, lack of free gas in the peritoneal cavity makes hollow viscus perforation unlikely. Nevertheless, close clinical follow-up recommended. 4. Small volume free fluid in the pelvis. 5. 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 6.  Aortic Atherosclerosis (ICD10-I70.0). Electronically Signed   By: Kennith Center M.D.   On: 07/20/2023 16:54   DG Chest Port 1 View  Result Date: 07/20/2023 CLINICAL DATA:  Sepsis. Cancer patient. Recent surgery. Known lung mass. EXAM: PORTABLE CHEST 1 VIEW COMPARISON:  X-ray 07/04/2023 and older.  CT angiogram 07/11/2023 FINDINGS: Slight elevation of the left hemidiaphragm. There is some patchy left perihilar opacity with some volume loss which is stable. Fiduciary marker in the peripheral right lung with a spiculated nodule, similar as well. No consolidation, pneumothorax or effusion which is new. Chronic lung changes. Dense likely calcified right apical nodules. Normal cardiopericardial silhouette. Film is rotated to the left. Scattered degenerative changes and osteopenia. Overlapping cardiac leads. IMPRESSION: Stable appearance of the thorax. Fiduciary marker in the right lung with a focal spiculated nodule is stable. Stable bandlike opacity left perihilar with volume loss. Electronically Signed   By: Karen Kays M.D.   On: 07/20/2023 15:40    Anti-infectives: Anti-infectives (From admission, onward)    Start     Dose/Rate Route Frequency Ordered Stop    07/21/23 2200  metroNIDAZOLE (FLAGYL) IVPB 500 mg  Status:  Discontinued        500 mg 100 mL/hr over 60 Minutes Intravenous 2 times daily 07/20/23 1923 07/20/23 2153   07/21/23 1800  vancomycin (VANCOREADY) IVPB 1500 mg/300 mL  Status:  Discontinued        1,500 mg 150 mL/hr over 120 Minutes Intravenous Every 24 hours 07/20/23 1930 07/21/23 1405   07/21/23 1600  vancomycin (VANCOCIN) IVPB 1000 mg/200 mL premix        1,000 mg 200 mL/hr over 60 Minutes Intravenous Every 12 hours 07/21/23 1405     07/21/23 0600  piperacillin-tazobactam (ZOSYN) IVPB 3.375 g        3.375 g 12.5 mL/hr over 240 Minutes Intravenous Every 8 hours 07/20/23 2209     07/20/23 2200  ceFEPIme (MAXIPIME) 2 g in sodium chloride 0.9 % 100 mL IVPB  Status:  Discontinued        2 g 200 mL/hr over 30 Minutes Intravenous Every 8 hours 07/20/23 1927 07/20/23 2209   07/20/23 1530  ceFEPIme (MAXIPIME) 2 g in sodium chloride 0.9 % 100 mL IVPB        2 g 200 mL/hr over 30 Minutes Intravenous  Once 07/20/23 1516 07/20/23 1620   07/20/23 1530  metroNIDAZOLE (FLAGYL) IVPB 500 mg        500 mg 100 mL/hr over 60 Minutes Intravenous  Once 07/20/23 1516 07/20/23 1712   07/20/23 1530  vancomycin (VANCOCIN) IVPB 1000 mg/200 mL premix  Status:  Discontinued        1,000 mg 200 mL/hr over 60 Minutes Intravenous  Once 07/20/23 1516 07/20/23 1518   07/20/23 1530  vancomycin (VANCOREADY) IVPB 1500 mg/300 mL        1,500 mg 150 mL/hr over 120 Minutes Intravenous  Once 07/20/23 1518 07/20/23 1840       Assessment/Plan: RUQ fluid collection s/p lap chole -no indication for surgery, afebrile, no peritonitis, WBC down to 9.6 -drain in place is functional. IR consulted for additional perc drainage of residual abscess in RUQ. Appreciate their assessment. COllection is small and they want to observe clinically for 48h. Will allow clears. -on Vanc/Zosyn per TRH -OK for pharm VTE prophylaxis from our standpoint -will follow with you     LOS: 2 days    Violeta Gelinas, MD, MPH, FACS Trauma & General Surgery Use [email or web address] to contact on call provider  07/22/2023

## 2023-07-23 LAB — CBC
HCT: 31.1 % — ABNORMAL LOW (ref 39.0–52.0)
Hemoglobin: 9.9 g/dL — ABNORMAL LOW (ref 13.0–17.0)
MCH: 28.9 pg (ref 26.0–34.0)
MCHC: 31.8 g/dL (ref 30.0–36.0)
MCV: 90.9 fL (ref 80.0–100.0)
Platelets: 187 10*3/uL (ref 150–400)
RBC: 3.42 MIL/uL — ABNORMAL LOW (ref 4.22–5.81)
RDW: 13.5 % (ref 11.5–15.5)
WBC: 7.3 10*3/uL (ref 4.0–10.5)
nRBC: 0 % (ref 0.0–0.2)

## 2023-07-23 LAB — BASIC METABOLIC PANEL WITH GFR
Anion gap: 9 (ref 5–15)
BUN: 10 mg/dL (ref 8–23)
CO2: 26 mmol/L (ref 22–32)
Calcium: 8.2 mg/dL — ABNORMAL LOW (ref 8.9–10.3)
Chloride: 101 mmol/L (ref 98–111)
Creatinine, Ser: 0.85 mg/dL (ref 0.61–1.24)
GFR, Estimated: >60 mL/min (ref >60)
Glucose, Bld: 98 mg/dL (ref 70–99)
Potassium: 3.5 mmol/L (ref 3.5–5.1)
Sodium: 136 mmol/L (ref 135–145)

## 2023-07-23 LAB — GLUCOSE, CAPILLARY
Glucose-Capillary: 104 mg/dL — ABNORMAL HIGH (ref 70–99)
Glucose-Capillary: 109 mg/dL — ABNORMAL HIGH (ref 70–99)
Glucose-Capillary: 130 mg/dL — ABNORMAL HIGH (ref 70–99)
Glucose-Capillary: 130 mg/dL — ABNORMAL HIGH (ref 70–99)
Glucose-Capillary: 136 mg/dL — ABNORMAL HIGH (ref 70–99)
Glucose-Capillary: 88 mg/dL (ref 70–99)

## 2023-07-23 LAB — APTT: aPTT: 54 s — ABNORMAL HIGH (ref 24–36)

## 2023-07-23 LAB — HEPARIN LEVEL (UNFRACTIONATED): Heparin Unfractionated: 0.36 [IU]/mL (ref 0.30–0.70)

## 2023-07-23 MED ORDER — ADULT MULTIVITAMIN W/MINERALS CH
1.0000 | ORAL_TABLET | Freq: Every day | ORAL | Status: DC
  Administered 2023-07-23 – 2023-07-25 (×3): 1 via ORAL
  Filled 2023-07-23 (×3): qty 1

## 2023-07-23 MED ORDER — ENSURE ENLIVE PO LIQD
237.0000 mL | Freq: Two times a day (BID) | ORAL | Status: DC
  Administered 2023-07-23 – 2023-07-25 (×6): 237 mL via ORAL

## 2023-07-23 NOTE — Progress Notes (Signed)
Subjective:   Summary: Todd Estrada is a 62 y.o. year old male currently admitted on the IMTS HD#3 for septic shock.  Overnight Events: None  Saw patient at bedside this morning. Interaction limited by patient's minimally verbal status. Patient indicated he was not feeling well, but gradually improving overall. No chest or abdominal pain, no shortness of breath. RUQ drain shows scant bilious drainage.   Objective:  Vital signs in last 24 hours: Vitals:   07/23/23 0010 07/23/23 0400 07/23/23 0700 07/23/23 0734  BP: 96/65 94/67 100/66   Pulse: 66 67    Resp: 18 20    Temp: 98.7 F (37.1 C) 98.9 F (37.2 C) 98.4 F (36.9 C)   TempSrc: Oral Oral Oral Oral  SpO2: 99% 98%    Weight:      Height:       Supplemental O2: Nasal Cannula SpO2: 98 % O2 Flow Rate (L/min): 2 L/min   Physical Exam:  Constitutional: ill appearing, lying in bed in no acute distress. Minimally verbal, at baseline.  Cardiovascular: RRR, no murmurs, rubs or gallops Pulmonary/Chest: normal work of breathing on room air, lungs clear to auscultation bilaterally Abdominal: soft, non-tender, non-distended. RUQ drain with no surrounding irritation or erythema.  Skin: warm and dry Neuro: Significantly decreased strength in right extremities.  Extremities: upper/lower extremity pulses 2+, no lower extremity edema present  Filed Weights   07/20/23 1511  Weight: 74.8 kg     Intake/Output Summary (Last 24 hours) at 07/23/2023 0931 Last data filed at 07/23/2023 0546 Gross per 24 hour  Intake 2528.91 ml  Output 2600 ml  Net -71.09 ml   Net IO Since Admission: 8,205.22 mL [07/23/23 0931]  Pertinent Labs:    Latest Ref Rng & Units 07/23/2023    3:12 AM 07/22/2023    3:57 AM 07/21/2023    5:06 AM  CBC  WBC 4.0 - 10.5 K/uL 7.3  9.6  17.6   Hemoglobin 13.0 - 17.0 g/dL 9.9  41.3  24.4   Hematocrit 39.0 - 52.0 % 31.1  32.7  34.9   Platelets 150 - 400 K/uL 187  182  232        Latest Ref Rng &  Units 07/23/2023    3:12 AM 07/22/2023    3:57 AM 07/21/2023    5:06 AM  CMP  Glucose 70 - 99 mg/dL 98  96  010   BUN 8 - 23 mg/dL 10  9  16    Creatinine 0.61 - 1.24 mg/dL 2.72  5.36  6.44   Sodium 135 - 145 mmol/L 136  135  135   Potassium 3.5 - 5.1 mmol/L 3.5  3.9  3.7   Chloride 98 - 111 mmol/L 101  101  99   CO2 22 - 32 mmol/L 26  26  27    Calcium 8.9 - 10.3 mg/dL 8.2  8.3  8.5   Total Protein 6.5 - 8.1 g/dL   6.2   Total Bilirubin 0.3 - 1.2 mg/dL   0.7   Alkaline Phos 38 - 126 U/L   59   AST 15 - 41 U/L   15   ALT 0 - 44 U/L   14    Imaging: No new imaging.   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 Marland Kitchen    #Severe sepsis due to post-surgical infection Patient presented with abdominal pain and fever in the setting of recent laparoscopic cholecystectomy July 05, 2023. CT Abdomen/Pelvis showed complex fluid collection in the gallbladder fossa with concern for abscess. WBC 32.4, Lactate 5.0. Patient was tachycardic, hypotensive, Tmax 100.5.  Admitted for treatment of severe sepsis with evidence of end organ damage.  -Patient was given Flagyl, cefepime, vancomycin in the ED, continued on empiric vancomycin and Zosyn -Per IR: no window to safely drain abscess. Plan to  continue antibiotics and reimage tomorrow 8/5 -Blood cultures show no growth to date -No further fever or tachycardia. Chronically hypotensive  #Right-sided weakness/aphasia #Facial twitching Patient presented with twitching of left face and left side of the body concerning for breakthrough seizures. Patient has history of seizure disorder, recrudescence of seizures in setting of acute illness.  -Neurology consulted, recommending continuing Keppra. Appreciate recommendations -No facial/body twitching observed, right-sided weakness appears stable and close to baseline.  -EEG performed 7/2 shows mild diffuse slowing and left hemispheric slowing, nonspecific encephalopathy  #History of prior left MCA CVA Patient appears approaching baseline of residual right sided hemiparesis and aphasia.  -Home statin resumed  #History of PE -Holding home Eliquis, heparin per pharmacy consult until all procedures are complete  #PAD -Continue home statin  #Chronic Hypotension/autonomic dysfunction -Continue home 5mg  midodrine  #Non-small cell (squamous cell) lung cancer -s/p chemotherapy. Currently on observation, follows with Dr. Shirline Frees  #Chronic deconditioning -PT/OT, likely will require SNF   Diet: Thin Fluids IVF: NS,50cc/hr VTE: Heparin Code: DNR PT/OT recs: Has not yet seen TOC recs: Will continue to follow Family Update:   Dispo: Anticipated discharge to Skilled nursing facility in >2 days pending medical stability.   Monna Fam, MD PGY-1 Internal Medicine Resident Pager Number [phone] Please contact the on call pager after 5 pm and on weekends at [phone].

## 2023-07-23 NOTE — Progress Notes (Signed)
Initial Nutrition Assessment  DOCUMENTATION CODES:   Not applicable  INTERVENTION:  Multivitamin w/ minerals daily Ensure Enlive po BID, each supplement provides 350 kcal and 20 grams of protein. Encourage good PO intake  Recommend obtaining new weight.   NUTRITION DIAGNOSIS:   Increased nutrient needs related to acute illness as evidenced by estimated needs.  GOAL:   Patient will meet greater than or equal to 90% of their needs  MONITOR:   PO intake, Supplement acceptance, Skin, Labs, I & O's  REASON FOR ASSESSMENT:   Consult Assessment of nutrition requirement/status  ASSESSMENT:   62 y.o. male presented to the ED with abdominal pain and fever. Pt recently underwent lap chole on 7/17 with drain placement. PMH include COPD, HLD, GERD, PAD, stroke w/ hemiparesis and aphasia, lung cancer, and malnutrition. Pt admitted with sepsis 2/2 gallbladder fossa abscess.   8/01 - Admitted 8/02 - transferred to [facility] 8/03 - SLP evaluation; diet advanced to dysphagia 2  RD working remotely at time of assessment. Pt with known severe aphasia and noted to nod yes or no to questions.  Spoke with RN, reports pt is eating fairly well. RD to order pt oral nutrition supplements given pressure injury and recent infection post-op.   Per weight history, it appears that current weight was pulled from previous admission. Would recommend obtaining new weight, RN notified. Pt evaluated by inpatient RD team in April 2024 and met criteria for malnutrition based on NFPE. Suspect pt still meets criteria for malnutrition, although RD unable to perform NFPE due to working remotely.   Medications reviewed and include: Protonix, IV antibiotics  Labs reviewed: Sodium 136, Potassium 35, BUN 10, Creatinine 0.85 CBG: 88-136 x 24 hrs  NUTRITION - FOCUSED PHYSICAL EXAM:  Deferred to follow-up.   Diet Order:   Diet Order             DIET DYS 2 Fluid consistency: Thin  Diet effective now                    EDUCATION NEEDS:   No education needs have been identified at this time  Skin:  Skin Assessment: Skin Integrity Issues: Skin Integrity Issues:: Stage II Stage II: R Buttocks  Last BM:  Unknown  Height:   Ht Readings from Last 1 Encounters:  07/20/23 6' (1.829 m)    Weight:   Wt Readings from Last 1 Encounters:  07/20/23 74.8 kg    Ideal Body Weight:  80.9 kg  BMI:  Body mass index is 22.38 kg/m.  Estimated Nutritional Needs:   Kcal:  2000-2200  Protein:  100-120 grams  Fluid:  >/= 2 L   Kirby Crigler RD, LDN Clinical Dietitian See [facility] for contact information.

## 2023-07-23 NOTE — Plan of Care (Signed)

## 2023-07-23 NOTE — Hospital Course (Addendum)
8/4  Endorses SOB  Remains on Abx  Soft pressures

## 2023-07-23 NOTE — Evaluation (Signed)
Occupational Therapy Evaluation Patient Details Name: Todd Estrada MRN: [id number] DOB: May 02, 1961 Today's Date: 07/23/2023   History of Present Illness 62 y.o. male Presented with abdominal pain and fever. Family also states that he has started to "shake" with "twitching" on the left side of his body. +septic shock due to postsurgical infection.  PMH includes PNA, COPD, CHF, CVA with aphasia and Rt hemiplegia, lung SCC stage IV, seizures, PAD on Eliquis, chronic hypotension on midodrine, status post lap chole 7/17 with JP drain placed,   Clinical Impression   Per chart review and RN report, at baseline pt lives with his cousin, receives assistance for all ADLs and IADLs, and performs bed/wheelchair transfers with +1 assist. Pt currently demonstrates ability to perform UB ADLs with Min to Mod assist from bed level, and LB ADLS with Max assist from bed level, and rolling in the bed with Mod assist. Pt with limited participation in eval from bed level this day and declining sitting EOB and OOB activity this session. Anticipate pt is likely near or at his baseline PLOF. However due to limited participation this session, OT to follow pt acutely to continue to assess pt functional level and needs and to maximize safety, independence, and rehab potential with ADLs, bed mobility during/in preparation for functional tasks, and functional transfers. Post acute discharge, no needs for OT follow up anticipated at this time.     Recommendations for follow up therapy are one component of a multi-disciplinary discharge planning process, led by the attending physician.  Recommendations may be updated based on patient status, additional functional criteria and insurance authorization.   Assistance Recommended at Discharge Frequent or constant Supervision/Assistance  Patient can return home with the following A little help with walking and/or transfers;A little help with bathing/dressing/bathroom;Assistance with  cooking/housework;Assistance with feeding;Direct supervision/assist for medications management;Direct supervision/assist for financial management;Assist for transportation;Help with stairs or ramp for entrance    Functional Status Assessment  Patient has had a recent decline in their functional status and demonstrates the ability to make significant improvements in function in a reasonable and predictable amount of time.  Equipment Recommendations  None recommended by OT    Recommendations for Other Services       Precautions / Restrictions Precautions Precautions: Fall Restrictions Weight Bearing Restrictions: No      Mobility Bed Mobility Overal bed mobility: Needs Assistance Bed Mobility: Rolling Rolling: Mod assist         General bed mobility comments: rolling mod assist to both left and right; pt refused to attempt sitting EOB or OOB    Transfers                   General transfer comment: pt refused OOB but may be close to his baseline (1 person assist bed to wheelchair)      Balance                                           ADL either performed or assessed with clinical judgement   ADL Overall ADL's : Needs assistance/impaired Eating/Feeding: Minimal assistance;Bed level   Grooming: Minimal assistance;Bed level   Upper Body Bathing: Moderate assistance;Bed level   Lower Body Bathing: Maximal assistance;Bed level   Upper Body Dressing : Moderate assistance;Bed level   Lower Body Dressing: Maximal assistance;Bed level       Toileting- Clothing Manipulation and Hygiene: Bed  level;Maximal assistance               Vision Baseline Vision/History: 0 No visual deficits;2 Legally blind (Left WFL, Right impaired) Ability to See in Adequate Light: 2 Moderately impaired Patient Visual Report: No change from baseline       Perception     Praxis      Pertinent Vitals/Pain Pain Assessment Pain Assessment: Faces Faces Pain  Scale: No hurt     Hand Dominance Left   Extremity/Trunk Assessment Upper Extremity Assessment Upper Extremity Assessment: Generalized weakness;RUE deficits/detail;LUE deficits/detail RUE Deficits / Details: R hemiparesis at baseline; when cued pt able to flex shoulder ~20 deg and flex elbow ~20 deg; PROM should flexion to approx. 80 degrees RUE Sensation: decreased light touch RUE Coordination: decreased fine motor;decreased gross motor LUE Deficits / Details: generalized weakness, decreased fine motor coordination LUE Coordination: decreased fine motor   Lower Extremity Assessment Lower Extremity Assessment: Defer to PT evaluation       Communication Communication Communication: Expressive difficulties   Cognition Arousal/Alertness: Awake/alert Behavior During Therapy: Flat affect Overall Cognitive Status: Difficult to assess                                 General Comments: via yes/no questions he provided the same responses re: home set-up and functional mobility as previous PT/OT evals had documented     General Comments  Pt with BP in supine with HOB elevated 97/69. All other VSS on 2L continuous O2 through nasal cannula.    Exercises     Shoulder Instructions      Home Living Family/patient expects to be discharged to:: Private residence Living Arrangements: Other relatives (cousin) Available Help at Discharge: Family;Available PRN/intermittently Type of Home: House Home Access: Stairs to enter [employer] of Steps: 1   Home Layout: One level     Bathroom Shower/Tub: Chief Strategy Officer: Standard Bathroom Accessibility: No   Home Equipment: BSC/3in1;Wheelchair - manual;Tub bench          Prior Functioning/Environment Prior Level of Function : Patient poor historian/Family not available             Mobility Comments: via yes/no responses pt reports he uses a wheelchair and he transfers with one person  assist (no device) ADLs Comments: pt reports assist from family; level of assist uncertain        OT Problem List:        OT Treatment/Interventions: Self-care/ADL training;Therapeutic exercise;Therapeutic activities;Patient/family education;Balance training;Cognitive remediation/compensation;DME and/or AE instruction    OT Goals(Current goals can be found in the care plan section) Acute Rehab OT Goals Patient Stated Goal: Pt unable to state OT Goal Formulation: Patient unable to participate in goal setting Time For Goal Achievement: 08/06/23 Potential to Achieve Goals: Fair ADL Goals Pt Will Perform Grooming: with supervision;sitting (sitting EOB with Fair balance) Pt Will Perform Upper Body Bathing: sitting;with min assist Pt Will Perform Upper Body Dressing: with min assist;sitting Pt Will Transfer to Toilet: with min assist;stand pivot transfer;bedside commode Pt Will Perform Toileting - Clothing Manipulation and hygiene: with min assist;sitting/lateral leans;sit to/from stand  OT Frequency: Min 1X/week    Co-evaluation PT/OT/SLP Co-Evaluation/Treatment: Yes Reason for Co-Treatment: Complexity of the patient's impairments (multi-system involvement);For patient/therapist safety;To address functional/ADL transfers PT goals addressed during session: Strengthening/ROM OT goals addressed during session: ADL's and self-care      AM-PAC OT "6 Clicks" Daily Activity  Outcome Measure Help from another person eating meals?: A Little Help from another person taking care of personal grooming?: A Little Help from another person toileting, which includes using toliet, bedpan, or urinal?: A Lot Help from another person bathing (including washing, rinsing, drying)?: A Lot Help from another person to put on and taking off regular upper body clothing?: A Lot Help from another person to put on and taking off regular lower body clothing?: A Lot 6 Click Score: 14   End of Session Equipment  Utilized During Treatment: Oxygen Nurse Communication: Mobility status  Activity Tolerance: Patient tolerated treatment well Patient left: in bed;with call bell/phone within reach;with bed alarm set;Other (comment) (with B bed rail seizure pads in place)  OT Visit Diagnosis: Other abnormalities of gait and mobility (R26.89)                Time: 8295-6213 OT Time Calculation (min): 27 min Charges:  OT General Charges $OT Visit: 1 Visit OT Evaluation $OT Eval Moderate Complexity: 1 Mod  [address] Yukon StreetMolson Coors Brewing., OTR/L, MA Acute Rehab [phone]   Lendon Colonel 07/23/2023, 4:31 PM

## 2023-07-23 NOTE — Evaluation (Signed)
Physical Therapy Evaluation Patient Details Name: Todd Estrada MRN: [id number] DOB: [birth year] Today's Date: 07/23/2023  History of Present Illness  62 y.o. male Presented with abdominal pain and fever. Family also states that he has started to "shake" with "twitching" on the left side of his body. +septic shock due to postsurgical infection.  PMH includes PNA, COPD, CHF, CVA with aphasia and Rt hemiplegia, lung SCC stage IV, seizures, PAD on Eliquis, chronic hypotension on midodrine, status post lap chole 7/17 with JP drain placed,  Clinical Impression   Pt admitted secondary to problem above with deficits below. PTA patient was living with his cousin (per chart & RN report) and using a wheelchair for locomotion and transferring with one person assist.  Pt currently requires mod assist with rolling in bed. He would not demonstrate side to sit or transfer OOB. Anticipate pt is near or at his baseline, however due to limited participation today will follow for acute PT to continue to assess needs. Anticipate patient will benefit from PT to address problems listed below.Will continue to follow acutely to maximize functional mobility independence and safety.           If plan is discharge home, recommend the following: A little help with walking and/or transfers;Assistance with cooking/housework;Direct supervision/assist for medications management;Direct supervision/assist for financial management;Assist for transportation;Help with stairs or ramp for entrance   Can travel by private vehicle        Equipment Recommendations None recommended by PT  Recommendations for Other Services       Functional Status Assessment Patient has had a recent decline in their functional status and demonstrates the ability to make significant improvements in function in a reasonable and predictable amount of time.     Precautions / Restrictions Precautions Precautions: Fall      Mobility  Bed  Mobility Overal bed mobility: Needs Assistance Bed Mobility: Rolling Rolling: Mod assist         General bed mobility comments: rolling mod assist to both left and right; pt refused to attempt sitting EOB or OOB    Transfers                   General transfer comment: pt refused OOB but ? if close to his baseline (1 person assist bed to wheelchair)    Ambulation/Gait                  Stairs            Wheelchair Mobility     Tilt Bed    Modified Rankin (Stroke Patients Only)       Balance                                             Pertinent Vitals/Pain Pain Assessment Pain Assessment: Faces Faces Pain Scale: No hurt    Home Living Family/patient expects to be discharged to:: Private residence Living Arrangements: Other (Comment) Available Help at Discharge: Family;Available PRN/intermittently (neice) Type of Home: House Home Access: Stairs to enter   [employer] of Steps: 1   Home Layout: One level Home Equipment: BSC/3in1;Wheelchair - manual;Tub bench      Prior Function Prior Level of Function : Patient poor historian/Family not available             Mobility Comments: via yes/no responses pt reports he uses a  wheelchair and he transfers with one person assist (no device) ADLs Comments: pt reports assist from family     Hand Dominance   Dominant Hand: Left    Extremity/Trunk Assessment   Upper Extremity Assessment Upper Extremity Assessment: Defer to OT evaluation    Lower Extremity Assessment RLE Deficits / Details: AAROM WFL; hip flexion 2+ LLE Deficits / Details: AROM WFL, strength grossly 3+/5       Communication   Communication: Expressive difficulties  Cognition Arousal/Alertness: Awake/alert Behavior During Therapy: Flat affect Overall Cognitive Status: Difficult to assess                                 General Comments: via yes/no questions he provided the  same responses re: home set-up and functional mobility as previous PT/OT evals had documented        General Comments      Exercises     Assessment/Plan    PT Assessment Patient needs continued PT services  PT Problem List Decreased strength;Decreased balance;Decreased mobility       PT Treatment Interventions DME instruction;Functional mobility training;Therapeutic activities;Therapeutic exercise;Balance training;Neuromuscular re-education;Patient/family education;Wheelchair mobility training    PT Goals (Current goals can be found in the Care Plan section)  Acute Rehab PT Goals Patient Stated Goal: pt unable to express PT Goal Formulation: With patient Time For Goal Achievement: 08/06/23 Potential to Achieve Goals: Good    Frequency Min 1X/week     Co-evaluation PT/OT/SLP Co-Evaluation/Treatment: Yes Reason for Co-Treatment: Complexity of the patient's impairments (multi-system involvement);For patient/therapist safety;To address functional/ADL transfers PT goals addressed during session: Strengthening/ROM         AM-PAC PT "6 Clicks" Mobility  Outcome Measure Help needed turning from your back to your side while in a flat bed without using bedrails?: A Lot Help needed moving from lying on your back to sitting on the side of a flat bed without using bedrails?: Total Help needed moving to and from a bed to a chair (including a wheelchair)?: Total Help needed standing up from a chair using your arms (e.g., wheelchair or bedside chair)?: Total Help needed to walk in hospital room?: Total Help needed climbing 3-5 steps with a railing? : Total 6 Click Score: 7    End of Session Equipment Utilized During Treatment: Oxygen Activity Tolerance: Patient tolerated treatment well Patient left: in bed;with call bell/phone within reach;with bed alarm set Nurse Communication: Mobility status PT Visit Diagnosis: Muscle weakness (generalized) (M62.81)    Time: 5956-3875 PT  Time Calculation (min) (ACUTE ONLY): 27 min   Charges:   PT Evaluation $PT Eval Low Complexity: 1 Low   PT General Charges $$ ACUTE PT VISIT: 1 Visit          Jerolyn Center, PT Acute Rehabilitation Services  Office [phone]   Zena Amos 07/23/2023, 1:00 PM

## 2023-07-23 NOTE — Progress Notes (Signed)
Patient ID: Todd Estrada, male   DOB: [birth year], 62 y.o.   MRN: [id number]      Subjective: Some aphasia but communicates diet going OK ROS negative except as listed above. Objective: Vital signs in last 24 hours: Temp:  [98.4 F (36.9 C)-99.2 F (37.3 C)] 98.4 F (36.9 C) (08/04 0700) Pulse Rate:  [62-77] 67 (08/04 0400) Resp:  [15-23] 20 (08/04 0400) BP: (88-100)/(55-67) 100/66 (08/04 0700) SpO2:  [92 %-99 %] 98 % (08/04 0400)    Intake/Output from previous day: 08/03 0701 - 08/04 0700 In: 2528.9 [I.V.:1778.9; IV Piggyback:750] Out: 2600 [Urine:2600] Intake/Output this shift: No intake/output data recorded.  General appearance: cooperative GI: soft, NT, JP serous Neuro: R sided weakness, some expressive aphasia  Lab Results: CBC  Recent Labs    07/22/23 0357 07/23/23 0312  WBC 9.6 7.3  HGB 10.4* 9.9*  HCT 32.7* 31.1*  PLT 182 187   BMET Recent Labs    07/22/23 0357 07/23/23 0312  NA 135 136  K 3.9 3.5  CL 101 101  CO2 26 26  GLUCOSE 96 98  BUN 9 10  CREATININE 0.90 0.85  CALCIUM 8.3* 8.2*   PT/INR Recent Labs    07/20/23 1517  LABPROT 17.4*  INR 1.4*   ABG Recent Labs    07/20/23 1948  HCO3 24.7    Studies/Results: EEG adult  Result Date: 07/21/2023 Windell Norfolk, MD     07/21/2023  4:55 PM History: 62 year old male with PMHx of Left MCA stroke, HLD, seizure, who is presenting with weakness and fever. EEG to rule out seizures EEG classification: Awake and drowsy Duration: 25 minutes Technical aspects: This EEG study was done with scalp electrodes positioned according to the 10-20 International system of electrode placement. Electrical activity was reviewed with band pass filter of 1-70Hz , sensitivity of 7 uV/mm, display speed of 24mm/sec with a 60Hz  notched filter applied as appropriate. EEG data were recorded continuously and digitally stored. Description of the recording: The background rhythms of this recording consists of a fairly well  modulated medium amplitude theta activity. Present in the anterior head region is a 15-20 Hz beta activity. Photic stimulation was performed, did not show any abnormalities. Hyperventilation was not performed. No abnormal epileptiform discharges seen during this recording. There was diffuse slowing and left hemispheric focal slowing. There were no electrographic seizure identified. Abnormality: Left hemispheric slowing Mild diffuse slowing Impression: This is an abnormal EEG recorded while drowsy and awake due to presence of left hemispheric slowing which is consistent with an area of neuronal dysfunction in the left hemisphere and mild diffuse slowing which is consistent with a generalized brain dysfunction nonspecific, such as encephalopathy. Windell Norfolk, MD [facility]    Anti-infectives: Anti-infectives (From admission, onward)    Start     Dose/Rate Route Frequency Ordered Stop   07/21/23 2200  metroNIDAZOLE (FLAGYL) IVPB 500 mg  Status:  Discontinued        500 mg 100 mL/hr over 60 Minutes Intravenous 2 times daily 07/20/23 1923 07/20/23 2153   07/21/23 1800  vancomycin (VANCOREADY) IVPB 1500 mg/300 mL  Status:  Discontinued        1,500 mg 150 mL/hr over 120 Minutes Intravenous Every 24 hours 07/20/23 1930 07/21/23 1405   07/21/23 1600  vancomycin (VANCOCIN) IVPB 1000 mg/200 mL premix        1,000 mg 200 mL/hr over 60 Minutes Intravenous Every 12 hours 07/21/23 1405     07/21/23 0600  piperacillin-tazobactam (ZOSYN) IVPB 3.375 g        3.375 g 12.5 mL/hr over 240 Minutes Intravenous Every 8 hours 07/20/23 2209     07/20/23 2200  ceFEPIme (MAXIPIME) 2 g in sodium chloride 0.9 % 100 mL IVPB  Status:  Discontinued        2 g 200 mL/hr over 30 Minutes Intravenous Every 8 hours 07/20/23 1927 07/20/23 2209   07/20/23 1530  ceFEPIme (MAXIPIME) 2 g in sodium chloride 0.9 % 100 mL IVPB        2 g 200 mL/hr over 30 Minutes Intravenous  Once 07/20/23 1516 07/20/23 1620    07/20/23 1530  metroNIDAZOLE (FLAGYL) IVPB 500 mg        500 mg 100 mL/hr over 60 Minutes Intravenous  Once 07/20/23 1516 07/20/23 1712   07/20/23 1530  vancomycin (VANCOCIN) IVPB 1000 mg/200 mL premix  Status:  Discontinued        1,000 mg 200 mL/hr over 60 Minutes Intravenous  Once 07/20/23 1516 07/20/23 1518   07/20/23 1530  vancomycin (VANCOREADY) IVPB 1500 mg/300 mL        1,500 mg 150 mL/hr over 120 Minutes Intravenous  Once 07/20/23 1518 07/20/23 1840       Assessment/Plan: RUQ fluid collection s/p lap chole -no indication for surgery, afebrile, no peritonitis, WBC down to 7.3 -drain in place is functional. IR consulted for additional perc drainage of residual abscess in RUQ. Appreciate their assessment. Collection is small and they want to observe clinically for 48h. D2 chopped diet -on Vanc/Zosyn per primary -OK for pharm VTE prophylaxis from our standpoint -will follow with you    LOS: 3 days    Violeta Gelinas, MD, MPH, FACS Trauma & General Surgery Use [email or web address] to contact on call provider  07/23/2023

## 2023-07-24 ENCOUNTER — Inpatient Hospital Stay (HOSPITAL_COMMUNITY): Payer: Medicaid Other

## 2023-07-24 DIAGNOSIS — A419 Sepsis, unspecified organism: Secondary | ICD-10-CM | POA: Diagnosis not present

## 2023-07-24 DIAGNOSIS — R6521 Severe sepsis with septic shock: Secondary | ICD-10-CM | POA: Diagnosis not present

## 2023-07-24 LAB — HEPARIN LEVEL (UNFRACTIONATED): Heparin Unfractionated: 0.17 IU/mL — ABNORMAL LOW (ref 0.30–0.70)

## 2023-07-24 LAB — GLUCOSE, CAPILLARY
Glucose-Capillary: 105 mg/dL — ABNORMAL HIGH (ref 70–99)
Glucose-Capillary: 113 mg/dL — ABNORMAL HIGH (ref 70–99)
Glucose-Capillary: 114 mg/dL — ABNORMAL HIGH (ref 70–99)
Glucose-Capillary: 117 mg/dL — ABNORMAL HIGH (ref 70–99)

## 2023-07-24 MED ORDER — APIXABAN 5 MG PO TABS: 5.0000 mg | ORAL_TABLET | Freq: Two times a day (BID) | ORAL | Status: DC

## 2023-07-24 MED ORDER — IOHEXOL 350 MG/ML SOLN
75.0000 mL | Freq: Once | INTRAVENOUS | Status: AC | PRN
  Administered 2023-07-24: 75 mL via INTRAVENOUS

## 2023-07-24 MED ORDER — APIXABAN 5 MG PO TABS
5.0000 mg | ORAL_TABLET | Freq: Two times a day (BID) | ORAL | Status: DC
  Administered 2023-07-24 – 2023-07-25 (×3): 5 mg via ORAL
  Filled 2023-07-24 (×3): qty 1

## 2023-07-24 MED ORDER — HEPARIN BOLUS VIA INFUSION
2000.0000 [IU] | Freq: Once | INTRAVENOUS | Status: AC
  Administered 2023-07-24: 2000 [IU] via INTRAVENOUS
  Filled 2023-07-24: qty 2000

## 2023-07-24 NOTE — Progress Notes (Signed)
Progress Note     Subjective: Pt reports RUQ abdominal pain. Drain with scant thin yellow-green fluid  Objective: Vital signs in last 24 hours: Temp:  [97.4 F (36.3 C)-99.3 F (37.4 C)] 98.3 F (36.8 C) (08/05 0847) Pulse Rate:  [69-76] 76 (08/05 0847) Resp:  [16-20] 20 (08/05 0847) BP: (105-128)/(50-77) 128/72 (08/05 0847) SpO2:  [100 %] 100 % (08/05 0847) Weight:  [83.6 kg] 83.6 kg (08/05 0436)    Intake/Output from previous day: 08/04 0701 - 08/05 0700 In: 578.9 [I.V.:278.9; IV Piggyback:300] Out: 1950 [Urine:1950] Intake/Output this shift: Total I/O In: 480 [P.O.:480] Out: -   PE: General: chronically ill appearing male who is laying in bed in NAD HEENT: sclera anicteric  Heart: regular, rate, and rhythm.   Lungs: Respiratory effort nonlabored Abd: soft, NT, ND, +BS, incisions C/D/I, surgical drain present with scant thin yellow-green fluid     Lab Results:  Recent Labs    07/23/23 0312 07/24/23 0241  WBC 7.3 7.1  HGB 9.9* 10.2*  HCT 31.1* 32.9*  PLT 187 198   BMET Recent Labs    07/23/23 0312 07/24/23 0241  NA 136 140  K 3.5 3.4*  CL 101 101  CO2 26 30  GLUCOSE 98 118*  BUN 10 8  CREATININE 0.85 0.84  CALCIUM 8.2* 8.4*   PT/INR No results for input(s): "LABPROT", "INR" in the last 72 hours. CMP     Component Value Date/Time   NA 140 07/24/2023 0241   NA 140 09/09/2022 0908   K 3.4 (L) 07/24/2023 0241   CL 101 07/24/2023 0241   CO2 30 07/24/2023 0241   GLUCOSE 118 (H) 07/24/2023 0241   BUN 8 07/24/2023 0241   BUN 16 09/09/2022 0908   CREATININE 0.84 07/24/2023 0241   CREATININE 0.80 05/04/2023 1332   CALCIUM 8.4 (L) 07/24/2023 0241   PROT 6.2 (L) 07/21/2023 0506   ALBUMIN 2.8 (L) 07/21/2023 0506   AST 15 07/21/2023 0506   AST 13 (L) 05/04/2023 1332   ALT 14 07/21/2023 0506   ALT 9 05/04/2023 1332   ALKPHOS 59 07/21/2023 0506   BILITOT 0.7 07/21/2023 0506   BILITOT 0.5 05/04/2023 1332   GFRNONAA >60 07/24/2023 0241    GFRNONAA >60 05/04/2023 1332   Lipase     Component Value Date/Time   LIPASE 28 07/20/2023 1517       Studies/Results: No results found.  Anti-infectives: Anti-infectives (From admission, onward)    Start     Dose/Rate Route Frequency Ordered Stop   07/21/23 2200  metroNIDAZOLE (FLAGYL) IVPB 500 mg  Status:  Discontinued        500 mg 100 mL/hr over 60 Minutes Intravenous 2 times daily 07/20/23 1923 07/20/23 2153   07/21/23 1800  vancomycin (VANCOREADY) IVPB 1500 mg/300 mL  Status:  Discontinued        1,500 mg 150 mL/hr over 120 Minutes Intravenous Every 24 hours 07/20/23 1930 07/21/23 1405   07/21/23 1600  vancomycin (VANCOCIN) IVPB 1000 mg/200 mL premix  Status:  Discontinued        1,000 mg 200 mL/hr over 60 Minutes Intravenous Every 12 hours 07/21/23 1405 07/23/23 1343   07/21/23 0600  piperacillin-tazobactam (ZOSYN) IVPB 3.375 g        3.375 g 12.5 mL/hr over 240 Minutes Intravenous Every 8 hours 07/20/23 2209     07/20/23 2200  ceFEPIme (MAXIPIME) 2 g in sodium chloride 0.9 % 100 mL IVPB  Status:  Discontinued  2 g 200 mL/hr over 30 Minutes Intravenous Every 8 hours 07/20/23 1927 07/20/23 2209   07/20/23 1530  ceFEPIme (MAXIPIME) 2 g in sodium chloride 0.9 % 100 mL IVPB        2 g 200 mL/hr over 30 Minutes Intravenous  Once 07/20/23 1516 07/20/23 1620   07/20/23 1530  metroNIDAZOLE (FLAGYL) IVPB 500 mg        500 mg 100 mL/hr over 60 Minutes Intravenous  Once 07/20/23 1516 07/20/23 1712   07/20/23 1530  vancomycin (VANCOCIN) IVPB 1000 mg/200 mL premix  Status:  Discontinued        1,000 mg 200 mL/hr over 60 Minutes Intravenous  Once 07/20/23 1516 07/20/23 1518   07/20/23 1530  vancomycin (VANCOREADY) IVPB 1500 mg/300 mL        1,500 mg 150 mL/hr over 120 Minutes Intravenous  Once 07/20/23 1518 07/20/23 1840        Assessment/Plan POD19 RUQ fluid collection s/p lap chole and ERCP for acute cholecystitis with choledocholithiasis - no indication for  surgery, afebrile, no peritonitis, WBC down to 7.3 - drain in place is functional. IR consulted for additional perc drainage of residual abscess in RUQ. Appreciate their assessment. Collection is small and they want to observe clinically for 48h.  - getting repeat CT today - on Vanc/Zosyn per primary - OK for pharm VTE prophylaxis from our standpoint  FEN: D2 diet, IVF per [facility] VTE: Eliquis ordered for this AM - may want to hold until after repeat CT ID: [email or web address]  - per [facility] -  Hx of PE on Eliquis  Hx of CVA with aphasia and R hemiparesis  Hx of HTN Hypotension on midodrine TID HLD GERD Seizure disease Lung cancer Peripheral vascular disease  LOS: 4 days     Juliet Rude, [facility] Surgery 07/24/2023, 9:10 AM Please see Amion for pager number during day hours 7:00am-4:30pm

## 2023-07-24 NOTE — Congregational Nurse Program (Signed)
ANTICOAGULATION CONSULT NOTE - Follow Up Consult  Pharmacy Consult for UFH by continuous infusion to be discontinued--> switch back to home dose of apixaban 5mg  PO BID. Indication: pulmonary embolus and DVT as part of previous medical history--> for continued secondary prophylaxis against recurrent event(s) as well as his peripheral arterial disease.   No Known Allergies  Estimated Creatinine Clearance: 98.8 mL/min (by C-G formula based on SCr of 0.84 mg/dL).   Assessment: Surgery and IR have indicated that at this time, no further interventional surgeries or procedures to be performed and we may resume his home apixaban dose and interval.  Plan:  Resume apixaban 5 milligrams, by mouth, twice daily.   Carollee Massed, CPP 07/24/2023,9:58 AM

## 2023-07-24 NOTE — Progress Notes (Signed)
SLP Cancellation Note  Patient Details Name: Todd Estrada MRN: [id number] DOB: [birth year]   Cancelled treatment:       Reason Eval/Treat Not Completed: Patient at procedure or test/unavailable   Pat ,M.S., CCC-SLP 07/24/2023, 3:15 PM

## 2023-07-24 NOTE — Progress Notes (Signed)
62 y.o. male inpatient. 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 Discharged with surgical drain in placement. Patient presented to the ED at [facility] on 8.1.23 with progressive weakness and fevers. Found to be septic. CT Abd pelvis from 8.1.24 reads 4.0 x 3.4 x 2.5 cm complex fluid collection in the gallbladder fossa containing a small gas bubble. Gas from surgery would not be expected after about 7-10 days. Gas could also be due to the presence of the surgical drain, but infection/evolving abscess in the gallbladder fossa a distinct concern. Team is requesting an intra abdominal abscess drain placement for infection control.   After review of procedure request/ images by IR Attending Dr. Richarda Overlie area in question is too small for percutaneous access. Due to the low probability of success Patient is not a candidate for percutaneous access. This was communicated directly to the Team via EPIC chat

## 2023-07-24 NOTE — Progress Notes (Signed)
Subjective:   Summary: Todd Estrada is a 62 y.o. year old male currently admitted on the IMTS HD#4 for septic shock.  Overnight Events: None  Saw patient at bedside this morning. Interaction limited by patient's minimally verbal status. Patient appeared much more alert than active than yesterday. Indicated he was feeling well. No new pain, no shortness of breath.   Objective:  Vital signs in last 24 hours: Vitals:   07/23/23 2014 07/23/23 2348 07/24/23 0436 07/24/23 0847  BP: 123/77 121/75 105/66 128/72  Pulse:    76  Resp:    20  Temp: (!) 97.4 F (36.3 C) 98.9 F (37.2 C) 98.9 F (37.2 C) 98.3 F (36.8 C)  TempSrc: Oral Oral Oral Oral  SpO2:    100%  Weight:   83.6 kg   Height:       Supplemental O2: Nasal Cannula SpO2: 100 % O2 Flow Rate (L/min): 1 L/min   Physical Exam:  Constitutional: ill appearing, lying in bed in no acute distress. Minimally verbal, at baseline.  Cardiovascular: RRR, no murmurs, rubs or gallops Pulmonary/Chest: normal work of breathing on room air, lungs clear to auscultation bilaterally Abdominal: soft, non-tender, non-distended. RUQ drain with no surrounding irritation or erythema.  Skin: warm and dry Neuro: Significantly decreased strength in right extremities.  Extremities: upper/lower extremity pulses 2+, no lower extremity edema present  [facility] Weights   07/20/23 1511 07/24/23 0436  Weight: 74.8 kg 83.6 kg     Intake/Output Summary (Last 24 hours) at 07/24/2023 1107 Last data filed at 07/24/2023 0842 Gross per 24 hour  Intake 1058.9 ml  Output 1000 ml  Net 58.9 ml   Net IO Since Admission: 7,314.12 mL [07/24/23 1107]  Pertinent Labs:    Latest Ref Rng & Units 07/24/2023    2:41 AM 07/23/2023    3:12 AM 07/22/2023    3:57 AM  CBC  WBC 4.0 - 10.5 K/uL 7.1  7.3  9.6   Hemoglobin 13.0 - 17.0 g/dL 16.1  9.9  09.6   Hematocrit 39.0 - 52.0 % 32.9  31.1  32.7   Platelets 150 - 400 K/uL 198  187  182        Latest  Ref Rng & Units 07/24/2023    2:41 AM 07/23/2023    3:12 AM 07/22/2023    3:57 AM  CMP  Glucose 70 - 99 mg/dL 045  98  96   BUN 8 - 23 mg/dL 8  10  9    Creatinine 0.61 - 1.24 mg/dL 4.09  8.11  9.14   Sodium 135 - 145 mmol/L 140  136  135   Potassium 3.5 - 5.1 mmol/L 3.4  3.5  3.9   Chloride 98 - 111 mmol/L 101  101  101   CO2 22 - 32 mmol/L 30  26  26    Calcium 8.9 - 10.3 mg/dL 8.4  8.2  8.3    Imaging: No new imaging.   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    #Severe sepsis due to post-surgical infection Patient presented with abdominal pain and fever in the setting of recent laparoscopic cholecystectomy July 05, 2023. CT Abdomen/Pelvis showed complex fluid collection in the gallbladder fossa with concern for abscess. Initial WBC 32.4, Lactate 5.0. Patient was tachycardic, hypotensive, Tmax 100.5.  Admitted for treatment of severe sepsis with evidence of end organ damage.  -Patient was given Flagyl, cefepime, vancomycin in the ED, currently only on zosyn per pharmacy -IR has reviewed images and stated fluid collection is too small for percutaneous access. Plan to continue antibiotics and reimage today -Surgery following, no indication for further intervention -Blood  cultures show no growth to date x4 days -No further fever or tachycardia. Chronically hypotensive  #Right-sided weakness/aphasia #Facial twitching Patient presented with twitching of left face and left side of the body concerning for breakthrough seizures. Patient has history of seizure disorder, recrudescence of seizures in setting of acute illness.  -Neurology consulted, recommending continuing Keppra. Appreciate recommendations -No facial/body twitching observed, right-sided weakness appears stable and close to baseline.  -EEG performed 7/2 shows mild diffuse slowing and left hemispheric slowing, nonspecific encephalopathy  #History of prior left MCA CVA Patient appears approaching baseline of residual right sided hemiparesis and aphasia.  -Home statin resumed  Chronic Problems #History of PE - Home Eliquis restarted as no further procedures are planned #PAD - Continue home statin #Chronic Hypotension/autonomic dysfunction - Continue home 5mg  midodrine #Non-small cell (squamous cell) lung cancer - s/p chemotherapy. Currently on observation, follows with Dr. Shirline Frees #Chronic deconditioning - PT/OT has seen, can be discharged home when medically stable   Diet: Thin Fluids IVF: NS,50cc/hr VTE: Heparin Code: DNR PT/OT recs: Can be discharged home TOC recs: Will continue to follow Family Update:   Dispo: Anticipated discharge to Home in 1 day pending medical stability.   Monna Fam, MD PGY-1 Internal Medicine Resident Pager Number [phone] Please contact the on call pager after 5 pm and on weekends at [phone].

## 2023-07-24 NOTE — Progress Notes (Signed)
ANTICOAGULATION CONSULT NOTE - Follow Up  Pharmacy Consult for heparin Indication: pulmonary embolus (2023), PAD  No Known Allergies  Patient Measurements: Height: 6' (182.9 cm) Weight: 83.6 kg (184 lb 4.9 oz) IBW/kg (Calculated) : 77.6 Heparin Dosing Weight: 74.8 kg  Vital Signs: Temp: 98.9 F (37.2 C) (08/05 0436) Temp Source: Oral (08/05 0436) BP: 105/66 (08/05 0436)  Labs: Recent Labs    07/22/23 0357 07/22/23 1928 07/23/23 0312 07/24/23 0241  HGB 10.4*  --  9.9* 10.2*  HCT 32.7*  --  31.1* 32.9*  PLT 182  --  187 198  APTT  --  45* 54* 50*  HEPARINUNFRC  --  0.36 0.36 0.17*  CREATININE 0.90  --  0.85 0.84    Estimated Creatinine Clearance: 98.8 mL/min (by C-G formula based on SCr of 0.84 mg/dL).   Medical History: Past Medical History:  Diagnosis Date   Asthma    Atypical chest pain 08/13/2022   Community acquired pneumonia 09/14/2022   COPD (chronic obstructive pulmonary disease) (HCC)    Essential hypertension 08/19/2021   GERD (gastroesophageal reflux disease)    HAP (hospital-acquired pneumonia) 09/16/2022   History of tracheostomy    03/09/22-04/11/22   HLD (hyperlipidemia)    Hypertension    Hypokalemia 08/13/2022   Hypomagnesemia 08/13/2022   Lung cancer (HCC)    PAD (peripheral artery disease) (HCC)    Seizures (HCC) 06/02/2022   Sepsis (HCC) 08/13/2022   Sepsis due to pneumonia (HCC) 04/13/2023   Stroke (HCC) 02/2022   Assessment: 83 yoM with PMH prior CVA, seizure disorder, NSCLC, recent hospitalization for cholecystitis s/p ERCP and cholecystectomy of presented with sepsis/weakness and RUQ fluid collection. Pharmacy has been consulted to dose heparin for VTE. Patient has history of PE and PAD (on eliquis with last dose unknown). Has been subtherapeutic on 1150 units/hr with aPTTs 45 and 54- heparin levels most likely still falsely elevated due to apixaban at 0.36  8/5 AM: heparin level 0.17 and aPTT 50 seconds on 1150 units/hr  (subtherapeutic). Levels starting to correlate will continue to monitor both. Per RN,. CBC stable around 10s and plts 180-190s. Per trauma note, no more procedures for patient and to follow up transitioning back to Eliquis in the daytime  Goal of Therapy:  Heparin level 0.3-0.7 units/ml aPTT 66-102 seconds Monitor platelets by anticoagulation protocol: Yes   Plan:  Bolus 2000 units x1 Increase heparin infusion at 1350 units/hr Check anti-Xa level and aPTT in 6 hours and daily while on heparin until levels continue to correlate Continue to monitor H&H and platelets Follow up transition back to eliquis  Arabella Merles, PharmD. Clinical Pharmacist 07/24/2023 4:48 AM

## 2023-07-25 ENCOUNTER — Other Ambulatory Visit (HOSPITAL_COMMUNITY): Payer: Self-pay

## 2023-07-25 DIAGNOSIS — R6521 Severe sepsis with septic shock: Secondary | ICD-10-CM | POA: Diagnosis not present

## 2023-07-25 DIAGNOSIS — K651 Peritoneal abscess: Secondary | ICD-10-CM

## 2023-07-25 DIAGNOSIS — A419 Sepsis, unspecified organism: Secondary | ICD-10-CM | POA: Diagnosis not present

## 2023-07-25 LAB — GLUCOSE, CAPILLARY
Glucose-Capillary: 97 mg/dL (ref 70–99)
Glucose-Capillary: 118 mg/dL — ABNORMAL HIGH (ref 70–99)

## 2023-07-25 MED ORDER — AMOXICILLIN-POT CLAVULANATE 500-125 MG PO TABS
1.0000 | ORAL_TABLET | Freq: Three times a day (TID) | ORAL | 0 refills | Status: AC
  Filled 2023-07-25: qty 21, 7d supply, fill #0

## 2023-07-25 MED ORDER — POLYETHYLENE GLYCOL 3350 17 G PO PACK
17.0000 g | PACK | Freq: Every day | ORAL | Status: DC
  Administered 2023-07-25: 17 g via ORAL
  Filled 2023-07-25: qty 1

## 2023-07-25 NOTE — Progress Notes (Signed)
AVS given and explained to patient's cousin via phone, TOC meds sent with the patient and family.

## 2023-07-25 NOTE — Progress Notes (Signed)
Patient's JP drain removed/d/c without difficulty. Patient tolerated well, incision covered with gauze.

## 2023-07-25 NOTE — Discharge Instructions (Addendum)
You were hospitalized for sepsis due to a complicated fluid collection of the gallbladder fossa. You were treated with IV antibiotics and IV fluids. You have continued to improve clinically, with no further output from your surgical drain and follow-up imaging showing improvement of the fluid collection. At this time, I feel you are medically stable for discharge with outpatient follow-up. Thank you for allowing [place] to be part of your care.   We arranged for you to follow up at: [facility] on Aug 19 at 1:15pm   Please note these changes made to your medications:   *Please START taking:   amoxicillin-clavulanate 500-125 MG tablet Commonly known as: Augmentin Take 1 tablet by mouth 3 (three) times daily for 7 days.      Please continue taking:     albuterol (2.5 MG/3ML) 0.083% nebulizer solution Commonly known as: PROVENTIL Take 3 mLs (2.5 mg total) by nebulization every 4 (four) hours as needed for wheezing or shortness of breath.    atorvastatin 40 MG tablet Commonly known as: Lipitor Take 1 tablet (40 mg total) by mouth daily.    budesonide 0.5 MG/2ML nebulizer solution Commonly known as: PULMICORT NEW PRESCRIPTION REQEUST: BUDESONIDE 0.5 MG/ - USE ONE VIAL TWICE DAILY    levETIRAcetam 500 MG tablet Commonly known as: KEPPRA Take 1 tablet (500 mg total) by mouth 2 (two) times daily.    midodrine 2.5 MG tablet Commonly known as: PROAMATINE Take 1 tablet (2.5 mg total) by mouth 2 (two) times daily with a meal.    pantoprazole 40 MG tablet Commonly known as: PROTONIX Take 1 tablet (40 mg total) by mouth daily.     sucralfate (CARAFATE) 1 GM/10ML suspension  Take 10 mLs (1 g total) by mouth 4 (four) times daily   Tiotropium Bromide-Olodaterol (STIOLTO RESPIMAT) 2.5-2.5 MCG/ACT AERS   apixaban (ELIQUIS) 5 MG TABS tablet TAKE 1 TABLET(5 MG) BY MOUTH TWICE DAILY    Please make sure to return to the hospital if you have worsening pain or have a  fever.   Please call our clinic if you have any questions or concerns, we may be able to help and keep you from a long and expensive emergency room wait. Our clinic and after hours phone number is [phone], the best time to call is Monday through Friday 9 am to 4 pm but there is always someone available 24/7 if you have an emergency. If you need medication refills please notify your pharmacy one week in advance and they will send [place] a request.

## 2023-07-25 NOTE — Progress Notes (Signed)
RN called patient's emergency contact, cousin will pick up patient at 3pm.

## 2023-07-25 NOTE — Progress Notes (Signed)
Speech Language Pathology Treatment: Dysphagia  Patient Details Name: Todd Estrada MRN: [id number] DOB: April 01, 1961 Today's Date: 07/25/2023 Time: 2536-6440 SLP Time Calculation (min) (ACUTE ONLY): 10 min  Assessment / Plan / Recommendation Clinical Impression  Pt seen with trials of solids and thin liquids without any overt s/s of aspiration. He presents with prolonged mastication, although suspect this is due to his edentulous status. Pt reports no concerns with eating/drinking since being on Dys 2 diet, but expressed interest in returning back to baseline diet. Pt's diet is modified at baseline due to him being edentulous. Recommend upgrading diet to Dys 3 textures with thin liquids. Meds can be given whole with liquid or with purees PRN. Since pt has returned to his baseline, no further SLP f/u is necessary. Will s/o.    HPI HPI: 62 y.o. 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     Admitted for  septic shock secondary to postsurgical infection; pt seen by ST 4/24 with recommendation for Dysphagia 3/thin liquids;chronic aphasia.  ST consulted for swallow evaluation.      SLP Plan  All goals met      Recommendations for follow up therapy are one component of a multi-disciplinary discharge planning process, led by the attending physician.  Recommendations may be updated based on patient status, additional functional criteria and insurance authorization.    Recommendations  Diet recommendations: Dysphagia 3 (mechanical soft);Thin liquid Liquids provided via: Straw;Cup Medication Administration: Whole meds with puree Supervision: Patient able to self feed;Intermittent supervision to cue for compensatory strategies Compensations: Slow rate;Small sips/bites Postural Changes and/or Swallow Maneuvers: Seated upright 90 degrees;Upright 30-60  min after meal                  Oral care BID;Staff/trained caregiver to provide oral care   Frequent or constant Supervision/Assistance Dysphagia, unspecified (R13.10)     All goals met     Gwynneth Aliment, M.A., CF-SLP Speech Language Pathology, Acute Rehabilitation Services  Secure Chat preferred [phone]   07/25/2023, 2:08 PM

## 2023-07-25 NOTE — Progress Notes (Signed)
Mobility Specialist Progress Note:   07/25/23 1222  Mobility  Activity Transferred from bed to chair  Level of Assistance Maximum assist, patient does 25-49% (+2)  Assistive Device  (HHA)  Activity Response Tolerated fair  Mobility Specialist Start Time (ACUTE ONLY) 1200  Mobility Specialist Stop Time (ACUTE ONLY) 1220  Mobility Specialist Time Calculation (min) (ACUTE ONLY) 20 min   Pt received in bed, agreeable to transfer to chair upon RN request. MaxA+2 with bed mobility. Experienced incontinence during transfer. RN assisted with pericare. Pt needed total assist with R side during transfer d/t RLE weakness. Pt returned to bed with RN present in room.   Leory Plowman  Mobility Specialist Please contact via [employer] office at [phone]

## 2023-07-25 NOTE — TOC Transition Note (Signed)
Transition of Care (TOC) - CM/SW Discharge Note Donn Pierini RN, BSN Transitions of Care Unit 4E- RN Case Manager See Treatment Team for direct phone #   Patient Details  Name: Todd Estrada MRN: [id number] Date of Birth: 1961-07-08  Transition of Care [facility]) CM/SW Contact:  Darrold Span, RN Phone Number: 07/25/2023, 12:32 PM   Clinical Narrative:    Pt stable for transition home today after JP removed.  CM spoke with pt to confirm transition needs and transport home.  Per pt he has needed DME at home, refusing [facility] services.  Pt voiced his cousin will transport home and bring wheelchair.  Staff to call cousin when pt ready for discharge.   No further TOC needs noted.    Final next level of care: Home/Self Care Barriers to Discharge: No Barriers Identified   Patient Goals and CMS Choice CMS Medicare.gov Compare Post Acute Care list provided to:: Patient Choice offered to / list presented to : NA  Discharge Placement                 Home        Discharge Plan and Services Additional resources added to the After Visit Summary for     Discharge Planning Services: CM Consult Post Acute Care Choice: Durable Medical Equipment          DME Arranged: N/A DME Agency: NA       HH Arranged: Patient Refused HH HH Agency: NA        Social Determinants of Health (SDOH) Interventions SDOH Screenings   Food Insecurity: No Food Insecurity (07/05/2023)  Housing: Low Risk  (07/05/2023)  Transportation Needs: No Transportation Needs (07/05/2023)  Utilities: Not At Risk (07/05/2023)  Depression (PHQ2-9): Low Risk  (06/01/2023)  Social Connections: Socially Isolated (01/06/2023)  Tobacco Use: Medium Risk (07/21/2023)     Readmission Risk Interventions    07/25/2023   12:32 PM 07/04/2023    3:54 PM 08/30/2022    3:39 PM  Readmission Risk Prevention Plan  Transportation Screening Complete Complete Complete  Medication Review Oceanographer) Complete Complete  Complete  PCP or Specialist appointment within 3-5 days of discharge  Complete Complete  HRI or Home Care Consult Complete Complete Complete  SW Recovery Care/Counseling Consult Complete Complete Complete  Palliative Care Screening Not Applicable Not Applicable Not Applicable  Skilled Nursing Facility Not Applicable Not Applicable Not Applicable

## 2023-07-25 NOTE — Progress Notes (Signed)
Physical Therapy Treatment Patient Details Name: Todd Estrada MRN: [id number] DOB: Mar 08, 1961 Today's Date: 07/25/2023   History of Present Illness 62 y.o. male Presented with abdominal pain and fever. Family also states that he has started to "shake" with "twitching" on the left side of his body. +septic shock due to postsurgical infection.  PMH includes PNA, COPD, CHF, CVA with aphasia and Rt hemiplegia, lung SCC stage IV, seizures, PAD on Eliquis, chronic hypotension on midodrine, status post lap chole 7/17 with JP drain placed,    PT Comments  Pt received in supine and agreeable to session. Pt reports new 10/10 RLE pain with mobility and to touch, however is able to perform mobility tasks, RN notified. Pt able to perform bed mobility with min-mod A for RLE management. Pt able to sit EOB with UE support and perform seated therex. Pt reports ability to perform bed mobility without assist and transfers with some assist at baseline. Pt continues to benefit from PT services to progress toward functional mobility goals.    If plan is discharge home, recommend the following: A little help with walking and/or transfers;Assistance with cooking/housework;Direct supervision/assist for medications management;Direct supervision/assist for financial management;Assist for transportation;Help with stairs or ramp for entrance   Can travel by private vehicle        Equipment Recommendations  None recommended by PT    Recommendations for Other Services       Precautions / Restrictions Precautions Precautions: Fall Precaution Comments: JP drain Restrictions Weight Bearing Restrictions: No     Mobility  Bed Mobility Overal bed mobility: Needs Assistance Bed Mobility: Rolling, Supine to Sit, Sit to Supine Rolling: Min assist   Supine to sit: Min assist, HOB elevated Sit to supine: Mod assist   General bed mobility comments: Min A for RLE management to sit EOB, but pt able to scoot forward and  elevate trunk without assist. Mod A for RLE elevation to EOB to return to supine and to supine scoot towards HOB        Balance Overall balance assessment: Needs assistance Sitting-balance support: Feet supported, Single extremity supported Sitting balance-Leahy Scale: Fair Sitting balance - Comments: Able to perform BLE therex with single UE support without LOB                                    Cognition Arousal/Alertness: Awake/alert Behavior During Therapy: Flat affect Overall Cognitive Status: Difficult to assess                                 General Comments: Pt able to answer yes/no questions        Exercises General Exercises - Lower Extremity Long Arc Quad: AROM, Seated, Right, Left, 5 reps, 10 reps (LLE x10 reps) Hip ABduction/ADduction: AROM, Supine, Both, 5 reps Hip Flexion/Marching: AROM, Seated, Left, 10 reps    General Comments General comments (skin integrity, edema, etc.): VSS on RA      Pertinent Vitals/Pain Pain Assessment Pain Assessment: 0-10 Pain Score: 10-Worst pain ever Pain Location: RLE Pain Descriptors / Indicators: Discomfort, Grimacing Pain Intervention(s): Monitored during session, Repositioned, Limited activity within patient's tolerance     PT Goals (current goals can now be found in the care plan section) Acute Rehab PT Goals Patient Stated Goal: pt unable to express PT Goal Formulation: With patient Time For Goal Achievement: 08/06/23  Potential to Achieve Goals: Good Progress towards PT goals: Progressing toward goals    Frequency    Min 1X/week      PT Plan Current plan remains appropriate       AM-PAC PT "6 Clicks" Mobility   Outcome Measure  Help needed turning from your back to your side while in a flat bed without using bedrails?: A Little Help needed moving from lying on your back to sitting on the side of a flat bed without using bedrails?: A Little Help needed moving to and from  a bed to a chair (including a wheelchair)?: Total Help needed standing up from a chair using your arms (e.g., wheelchair or bedside chair)?: Total Help needed to walk in hospital room?: Total Help needed climbing 3-5 steps with a railing? : Total 6 Click Score: 10    End of Session   Activity Tolerance: Patient tolerated treatment well Patient left: in bed;with call bell/phone within reach;with bed alarm set Nurse Communication: Mobility status PT Visit Diagnosis: Muscle weakness (generalized) (M62.81)     Time: 1610-9604 PT Time Calculation (min) (ACUTE ONLY): 23 min  Charges:    $Therapeutic Exercise: 8-22 mins $Therapeutic Activity: 8-22 mins PT General Charges $$ ACUTE PT VISIT: 1 Visit                     Johny Shock, PTA Acute Rehabilitation Services Secure Chat Preferred  [phone]    Johny Shock 07/25/2023, 11:11 AM

## 2023-07-25 NOTE — Discharge Summary (Signed)
Name: Todd Estrada MRN: [id number] DOB: [birth year] 62 y.o. PCP: Todd Fabian, MD  Date of Admission: 07/20/2023  2:48 PM Date of Discharge: 07/25/23 Attending Physician: Dr. 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   amoxicillin-clavulanate 500-125 MG tablet Commonly known as: Augmentin Take 1 tablet by mouth 3 (three) times daily for 7 days.   atorvastatin 40 MG tablet Commonly known as: Lipitor Take 1 tablet (40 mg total) by mouth daily.   budesonide 0.5 MG/2ML nebulizer solution Commonly known as: PULMICORT NEW PRESCRIPTION REQEUST: BUDESONIDE 0.5 MG/ - USE ONE VIAL TWICE DAILY   Eliquis 5 MG Tabs tablet Generic drug: apixaban TAKE 1 TABLET(5 MG) BY MOUTH TWICE DAILY What changed: See the new instructions.   levETIRAcetam 500 MG tablet Commonly known as: KEPPRA Take 1 tablet (500 mg total) by mouth 2 (two) times daily.   midodrine 2.5 MG tablet Commonly known as: PROAMATINE Take 1 tablet (2.5 mg total) by mouth 2 (two) times daily with a meal.   pantoprazole 40 MG tablet Commonly known as: PROTONIX Take 1 tablet (40 mg total) by mouth daily.   Stiolto Respimat 2.5-2.5 MCG/ACT Aers Generic drug: Tiotropium Bromide-Olodaterol NEW PRESCRIPTION  REQUEST: STIOLTO 2.5 MCG- INHALE TWO PUFFS BY MOUTH DAILY What changed:  how much to take how to take this when to take this additional instructions   sucralfate 1 GM/10ML suspension Commonly known as: Carafate Take 10 mLs (1 g total) by mouth 4 (four) times daily -  with meals and at bedtime.               Discharge Care Instructions  (From admission, onward)           Start     Ordered   07/25/23 0000  Change dressing (specify)       Comments: Dressing change: 1 times per day using sterile gauze.   07/25/23 1112            Disposition and follow-up:   Todd Estrada was discharged from [facility] in Stable condition.  At the hospital follow up visit please address:  1.  Follow-up:  a. Severe sepsis secondary to post-surgical infection: ensure no acute abdominal pain, fevers, and vitals are stable  b. Seizure activity: ensure seizures have been well-controlled  c. DVT/PE: ensure patient is taking Eliquis 5mg  BID  d. Chronic hypotension/autonomic dysfunction: continue home midodrine 5mg   2.  Labs / imaging needed at time of follow-up: none  3.  Pending labs/ test needing follow-up: none  4.  Medication Changes  Started: none  Stopped: none  Changed: none  Abx - Augmentin 250mg  TID  End Date: 08/01/23  Follow-up Appointments:  Follow-up Information  Todd Soda, MD. Go on 08/14/2023.   Specialties: General Surgery, Colon and Rectal Surgery Why: 10:45 AM, please arrive by 10:30AM to check in. Contact information: [address] [place] [address] [phone]                 Hospital Course by problem list:   #Severe sepsis due to post-surgical infection Patient presented with abdominal pain and fever in the setting of recent laparoscopic cholecystectomy July 05, 2023. CT Abdomen/Pelvis showed complex fluid collection in the gallbladder fossa with concern for abscess. Initial WBC 32.4, Lactate 5.0.  Patient was tachycardic, hypotensive, Tmax 100.5.  Admitted for treatment of severe sepsis with evidence of end organ damage. Treated with zosyn. Blood cultures showed no growth to date x 4 days. Follow-up CT on 8/5 showed no organized fluid collection. Surgery team saw and felt patient was stable for discharge and could have his drain removed today with outpatient follow-up. At time of discharge, patient was afebrile and hemodynamically stable with no abdominal pain.   #Recrudescence of seizure activity Patient presented with twitching of left face and left side of the body concerning for breakthrough seizures. Patient has history of seizure disorder, possible recrudescence of seizures in setting of acute illness. Neurology was consulted who recommended continuing patient's home Keppra. No further facial/body twitching was observed during patient's hospital admission.  #History of prior left MCA CVA Patient appears approaching baseline of residual right-sided hemiparesis and aphasia. His home statin was resumed.   #History of DVT/PE Patient's home Eliquis was restarted for his history of DVT/PE  #PAD Patient's home statin was resumed for history of PAD  #Chronic hypotension/autonomic dysfunction Patient has a history of hypotension and autonomic dysfunction, for which he is on a home dose of midodrine 5mg . This was continued throughout his hospital admission, as well as maintenance fluid in the setting of acute sepsis. Blood pressure was stable at the time of discharge.   #History squamous cell lung cancer S/p chemotherapy, currently on observation. Follows with Dr. Shirline Estrada  #Chronic deconditioning PT/OT has seen and determined patient can be discharged home when medically stable.    Discharge Subjective: Saw patient at bedside this morning. [name] seemed pleased to be able to go home today. He indicated no new pain or shortness of breath. Very scant bilious discharge in his drain,  which he was informed will be removed before his discharge.   Discharge Exam:   BP (!) 120/59 (BP Location: Left Arm)   Pulse 64   Temp 98.8 F (37.1 C) (Oral)   Resp 16   Ht 6' (1.829 m)   Wt 81.8 kg   SpO2 95%   BMI 24.46 kg/m  Constitutional: chronically ill appearing, laying in bed, in no acute distress HENT: normocephalic atraumatic, mucous membranes moist Eyes: conjunctiva non-erythematous Neck: supple Cardiovascular: regular rate and rhythm, no m/r/g Pulmonary/Chest: normal work of breathing on room air, lungs clear to auscultation bilaterally Abdominal: soft, non-tender, non-distended MSK: normal bulk and tone Neurological: alert & oriented x 3, 5/5 strength in bilateral upper and lower extremities, normal gait Skin: warm and dry  Pertinent Labs, Studies, and Procedures:     Latest Ref Rng & Units 07/25/2023   12:30 AM 07/24/2023    2:41 AM 07/23/2023    3:12 AM  CBC  WBC 4.0 - 10.5 K/uL 7.2  7.1  7.3   Hemoglobin 13.0 - 17.0 g/dL 62.3  76.2  9.9   Hematocrit 39.0 - 52.0 %  32.1  32.9  31.1   Platelets 150 - 400 K/uL 196  198  187        Latest Ref Rng & Units 07/25/2023   12:30 AM 07/24/2023    2:41 AM 07/23/2023    3:12 AM  CMP  Glucose 70 - 99 mg/dL 151  761  98   BUN 8 - 23 mg/dL 10  8  10    Creatinine 0.61 - 1.24 mg/dL 6.07  3.71  0.62   Sodium 135 - 145 mmol/L 137  140  136   Potassium 3.5 - 5.1 mmol/L 3.5  3.4  3.5   Chloride 98 - 111 mmol/L 100  101  101   CO2 22 - 32 mmol/L 28  30  26    Calcium 8.9 - 10.3 mg/dL 8.3  8.4  8.2   Total Protein 6.5 - 8.1 g/dL 6.4     Total Bilirubin 0.3 - 1.2 mg/dL 0.4     Alkaline Phos 38 - 126 U/L 53     AST 15 - 41 U/L 13     ALT 0 - 44 U/L 12       EEG adult  Result Date: 07/21/2023 Windell Norfolk, MD     07/21/2023  4:55 PM History: 62 year old male with PMHx of Left MCA stroke, HLD, seizure, who is presenting with weakness and fever. EEG to rule out seizures EEG classification: Awake and drowsy Duration: 25 minutes  Technical aspects: This EEG study was done with scalp electrodes positioned according to the 10-20 International system of electrode placement. Electrical activity was reviewed with band pass filter of 1-70Hz , sensitivity of 7 uV/mm, display speed of 40mm/sec with a 60Hz  notched filter applied as appropriate. EEG data were recorded continuously and digitally stored. Description of the recording: The background rhythms of this recording consists of a fairly well modulated medium amplitude theta activity. Present in the anterior head region is a 15-20 Hz beta activity. Photic stimulation was performed, did not show any abnormalities. Hyperventilation was not performed. No abnormal epileptiform discharges seen during this recording. There was diffuse slowing and left hemispheric focal slowing. There were no electrographic seizure identified. Abnormality: Left hemispheric slowing Mild diffuse slowing Impression: This is an abnormal EEG recorded while drowsy and awake due to presence of left hemispheric slowing which is consistent with an area of neuronal dysfunction in the left hemisphere and mild diffuse slowing which is consistent with a generalized brain dysfunction nonspecific, such as encephalopathy. Windell Norfolk, MD [facility]   CT HEAD WO CONTRAST ( )  Result Date: 07/21/2023 CLINICAL DATA:  Seizure, focal (Ped 0-17y) EXAM: CT HEAD WITHOUT CONTRAST TECHNIQUE: Contiguous axial images were obtained from the base of the skull through the vertex without intravenous contrast. RADIATION DOSE REDUCTION: This exam was performed according to the departmental dose-optimization program which includes automated exposure control, adjustment of the mA and/or kV according to patient size and/or use of iterative reconstruction technique. COMPARISON:  CT head 07/04/2023 FINDINGS: Brain: Patchy and confluent areas of decreased attenuation are noted throughout the deep and periventricular white matter of  the cerebral hemispheres bilaterally, compatible with chronic microvascular ischemic disease. Similar-appearing chronic left MCA territory and left cerebellar infarctions. No evidence of large-territorial acute infarction. No parenchymal hemorrhage. No mass lesion. No extra-axial collection. No mass effect or midline shift. No hydrocephalus. Basilar cisterns are patent. Vascular: No hyperdense vessel. Skull: No acute fracture or focal lesion. Sinuses/Orbits: Almost complete opacification of the right maxillary sinus fluid  CT of the sinuses of chronic infection. Otherwise paranasal sinuses and mastoid air cells are clear. The orbits are unremarkable. Other: None. IMPRESSION: No acute intracranial abnormality in a patient with similar-appearing chronic left MCA territory and left cerebellar infarctions. If high clinical suspicion for superimposed acute infarction, please consider MRI brain noncontrast for further evaluation. Electronically Signed   By: Tish Frederickson M.D.   On: 07/21/2023 03:41   CT ABDOMEN PELVIS W CONTRAST  Result Date: 07/20/2023 CLINICAL DATA:  Status post laparoscopic cholecystectomy for 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 EXAM: CT ABDOMEN AND PELVIS WITH CONTRAST TECHNIQUE: Multidetector CT imaging of the abdomen and pelvis was performed using the standard protocol following bolus administration of intravenous contrast. RADIATION DOSE REDUCTION: This exam was performed according to the departmental dose-optimization program which includes automated exposure control, adjustment of the mA and/or kV according to patient size and/or use of iterative reconstruction technique. CONTRAST:  80mL OMNIPAQUE IOHEXOL 300 MG/ML  SOLN COMPARISON:  07/12/2023 FINDINGS: Lower chest: Trace atelectasis or scarring noted dependent left lung base. Hepatobiliary: No suspicious focal abnormality within the liver parenchyma. 4.0 x 3.4 x 2.5 cm complex fluid collection is identified in the  gallbladder fossa containing a small gas bubble (axial 33/2 and coronal 100/7). The tip of a surgical drain is positioned in the posterior gallbladder fossa adjacent to the surgical clips coursing along the posteroinferior liver margin and out the right anterior abdominal wall. Pancreas: No focal mass lesion. No dilatation of the main duct. No intraparenchymal cyst. No peripancreatic edema. Spleen: No splenomegaly. No suspicious focal mass lesion. Adrenals/Urinary Tract: No adrenal nodule or mass. Kidneys unremarkable. No evidence for hydroureter. The urinary bladder appears normal for the degree of distention. Stomach/Bowel: Stomach is unremarkable. No gastric wall thickening. No evidence of outlet obstruction. Duodenum is normally positioned as is the ligament of Treitz. No small bowel wall thickening. No small bowel dilatation. The terminal ileum is normal. The appendix is normal. There is some wall thickening in the splenic flexure of the colon as it courses under the liver. Edema/inflammation is seen around the splenic flexure of the colon and in the right omentum with fluid and edema around the right liver and tracking towards Morison's pouch. Left colon unremarkable. Vascular/Lymphatic: There is moderate atherosclerotic calcification of the abdominal aorta without aneurysm. 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 There is no gastrohepatic or hepatoduodenal ligament lymphadenopathy. No retroperitoneal or mesenteric lymphadenopathy. No pelvic sidewall lymphadenopathy. Reproductive: Prostate gland is heterogeneous. Other: Small volume free fluid is seen in the pelvis. Extraluminal gas is seen in the midline anterior pelvis and anterior left lower quadrant towards the left groin region. This gas may be extraperitoneal in the preperitoneal fat. Musculoskeletal: No worrisome lytic or sclerotic osseous abnormality. IMPRESSION: 1. 4.0 x 3.4  x 2.5 cm complex fluid collection in the gallbladder fossa containing a small gas bubble. Gas from surgery would not be expected after about 7-10 days. Gas could also be due to the presence of the surgical drain, but infection/evolving abscess in the gallbladder fossa a distinct concern. 2. Edema/inflammation around the splenic flexure of the colon and in the right omentum with fluid and edema around the right liver and tracking towards Morison's pouch. Given the recent surgery and findings in the gallbladder fossa, this is probably secondary inflammatory change. Focal colitis with edema spreading up under the liver is considered less likely 3. Extraluminal gas in the midline anterior pelvis  and anterior left lower quadrant towards the left groin region. Location of this small volume gas suggests that it is extraperitoneal, in the preperitoneal space. Again, while gas after surgery is not typical after about 7-10 days, lack of free gas in the peritoneal cavity makes hollow viscus perforation unlikely. Nevertheless, close clinical follow-up recommended. 4. Small volume free fluid in the pelvis. 5. 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 6.  Aortic Atherosclerosis (ICD10-I70.0). Electronically Signed   By: Kennith Center M.D.   On: 07/20/2023 16:54   DG Chest Port 1 View  Result Date: 07/20/2023 CLINICAL DATA:  Sepsis. Cancer patient. Recent surgery. Known lung mass. EXAM: PORTABLE CHEST 1 VIEW COMPARISON:  X-ray 07/04/2023 and older.  CT angiogram 07/11/2023 FINDINGS: Slight elevation of the left hemidiaphragm. There is some patchy left perihilar opacity with some volume loss which is stable. Fiduciary marker in the peripheral right lung with a spiculated nodule, similar as well. No consolidation, pneumothorax or effusion which is new. Chronic lung changes. Dense likely calcified right apical nodules. Normal cardiopericardial silhouette.  Film is rotated to the left. Scattered degenerative changes and osteopenia. Overlapping cardiac leads. IMPRESSION: Stable appearance of the thorax. Fiduciary marker in the right lung with a focal spiculated nodule is stable. Stable bandlike opacity left perihilar with volume loss. Electronically Signed   By: Karen Kays M.D.   On: 07/20/2023 15:40     Discharge Instructions: Discharge Instructions     Change dressing (specify)   Complete by: As directed    Dressing change: 1 times per day using sterile gauze.   Diet - low sodium heart healthy   Complete by: As directed    Increase activity slowly   Complete by: As directed        Signed: Monna Fam, MD PGY-1 07/25/2023, 11:39 AM   Pager: [phone]

## 2023-07-25 NOTE — Progress Notes (Signed)
Progress Note     Subjective: Pt denies pain. Tolerating diet. Drain with minimal output and follow up CT appears improved - discussed with primary team and patient.   Objective: Vital signs in last 24 hours: Temp:  [97.4 F (36.3 C)-99.3 F (37.4 C)] 98.8 F (37.1 C) (08/06 0748) Pulse Rate:  [64-75] 64 (08/06 0748) Resp:  [16-20] 16 (08/06 0748) BP: (116-133)/(59-77) 120/59 (08/06 0748) SpO2:  [95 %-97 %] 95 % (08/06 0748) Weight:  [81.8 kg] 81.8 kg (08/06 0406) Last BM Date : 07/23/23  Intake/Output from previous day: 08/05 0701 - 08/06 0700 In: 3392.9 [P.O.:717; I.V.:2175.8; IV Piggyback:500] Out: 2100 [Urine:2100] Intake/Output this shift: No intake/output data recorded.  PE: General: chronically ill appearing male who is laying in bed in NAD HEENT: sclera anicteric  Heart: regular, rate, and rhythm.   Lungs: Respiratory effort nonlabored Abd: soft, NT, ND, +BS, incisions C/D/I, surgical drain present with scant fluid     Lab Results:  Recent Labs    07/24/23 0241 07/25/23 0030  WBC 7.1 7.2  HGB 10.2* 10.1*  HCT 32.9* 32.1*  PLT 198 196   BMET Recent Labs    07/24/23 0241 07/25/23 0030  NA 140 137  K 3.4* 3.5  CL 101 100  CO2 30 28  GLUCOSE 118* 136*  BUN 8 10  CREATININE 0.84 0.86  CALCIUM 8.4* 8.3*   PT/INR No results for input(s): "LABPROT", "INR" in the last 72 hours. CMP     Component Value Date/Time   NA 137 07/25/2023 0030   NA 140 09/09/2022 0908   K 3.5 07/25/2023 0030   CL 100 07/25/2023 0030   CO2 28 07/25/2023 0030   GLUCOSE 136 (H) 07/25/2023 0030   BUN 10 07/25/2023 0030   BUN 16 09/09/2022 0908   CREATININE 0.86 07/25/2023 0030   CREATININE 0.80 05/04/2023 1332   CALCIUM 8.3 (L) 07/25/2023 0030   PROT 6.4 (L) 07/25/2023 0030   ALBUMIN 2.3 (L) 07/25/2023 0030   AST 13 (L) 07/25/2023 0030   AST 13 (L) 05/04/2023 1332   ALT 12 07/25/2023 0030   ALT 9 05/04/2023 1332   ALKPHOS 53 07/25/2023 0030   BILITOT 0.4  07/25/2023 0030   BILITOT 0.5 05/04/2023 1332   GFRNONAA >60 07/25/2023 0030   GFRNONAA >60 05/04/2023 1332   Lipase     Component Value Date/Time   LIPASE 28 07/20/2023 1517       Studies/Results: CT ABDOMEN PELVIS W CONTRAST  Result Date: 07/24/2023 CLINICAL DATA:  Intra-abdominal abscess post surgical infection, abscess of gallbladder fossa EXAM: CT ABDOMEN AND PELVIS WITH CONTRAST TECHNIQUE: Multidetector CT imaging of the abdomen and pelvis was performed using the standard protocol following bolus administration of intravenous contrast. RADIATION DOSE REDUCTION: This exam was performed according to the departmental dose-optimization program which includes automated exposure control, adjustment of the mA and/or kV according to patient size and/or use of iterative reconstruction technique. CONTRAST:  75mL OMNIPAQUE IOHEXOL 350 MG/ML SOLN COMPARISON:  CT abdomen pelvis 07/20/2023 FINDINGS: Lower chest: Interval development of trace right pleural effusion. Hepatobiliary: No focal liver abnormality. Status post cholecystectomy. No biliary dilatation. Pancreas: No focal lesion. Normal pancreatic contour. No surrounding inflammatory changes. No main pancreatic ductal dilatation. Spleen: Enlarged spleen measuring up to 14 cm.  No focal lesion. Adrenals/Urinary Tract: No adrenal nodule bilaterally. Bilateral kidneys enhance symmetrically. Calcifications associated with the kidneys are vascular. No definite nephroureterolithiasis. No hydronephrosis. No hydroureter. The urinary bladder is unremarkable. Stomach/Bowel: Stomach is  within normal limits. No evidence of bowel wall thickening or dilatation. Appendix appears normal. Vascular/Lymphatic: No abdominal aorta or iliac aneurysm. Severe atherosclerotic plaque of the aorta and its branches. Chronic left common iliac artery occlusion and right common iliac artery severe narrowing due to atherosclerotic plaque. No abdominal, pelvic, or inguinal  lymphadenopathy. Reproductive: Prostate is unremarkable. Other: Right anterior abdominal percutaneous surgical drain with tip terminating at the cholecystectomy surgical site. Interval slight decrease of trace volume perihepatic, pericolonic along the hepatic flexure, and cholecystectomy surgical site intraperitoneal fluid. Associated fat stranding. Interval improvement in almost complete resolution of the left inguinal region intraperitoneal gas-likely postsurgical (3:77). No organized fluid collection. Musculoskeletal: No abdominal wall hernia or abnormality. No suspicious lytic or blastic osseous lesions. No acute displaced fracture. IMPRESSION: 1. Interval development of trace right pleural effusion. 2. Interval slight decrease of trace volume perihepatic, pericolonic along the hepatic flexure, and cholecystectomy surgical site intraperitoneal fluid. Associated fat stranding. No organized fluid collection/biloma/abscess formation. Findings may be postsurgical. Biliary leak is not fully excluded-if concern, consider nuclear medicine HIDA scan. 3. Interval improvement in almost complete resolution of the left inguinal region intraperitoneal gas-likely postsurgical. 4. Aortic Atherosclerosis (ICD10-I70.0)-severe. Chronic left common iliac artery occlusion and right common iliac artery severe narrowing due to atherosclerotic plaque. Electronically Signed   By: Tish Frederickson M.D.   On: 07/24/2023 16:19    Anti-infectives: Anti-infectives (From admission, onward)    Start     Dose/Rate Route Frequency Ordered Stop   07/21/23 2200  metroNIDAZOLE (FLAGYL) IVPB 500 mg  Status:  Discontinued        500 mg 100 mL/hr over 60 Minutes Intravenous 2 times daily 07/20/23 1923 07/20/23 2153   07/21/23 1800  vancomycin (VANCOREADY) IVPB 1500 mg/300 mL  Status:  Discontinued        1,500 mg 150 mL/hr over 120 Minutes Intravenous Every 24 hours 07/20/23 1930 07/21/23 1405   07/21/23 1600  vancomycin (VANCOCIN) IVPB  1000 mg/200 mL premix  Status:  Discontinued        1,000 mg 200 mL/hr over 60 Minutes Intravenous Every 12 hours 07/21/23 1405 07/23/23 1343   07/21/23 0600  piperacillin-tazobactam (ZOSYN) IVPB 3.375 g        3.375 g 12.5 mL/hr over 240 Minutes Intravenous Every 8 hours 07/20/23 2209     07/20/23 2200  ceFEPIme (MAXIPIME) 2 g in sodium chloride 0.9 % 100 mL IVPB  Status:  Discontinued        2 g 200 mL/hr over 30 Minutes Intravenous Every 8 hours 07/20/23 1927 07/20/23 2209   07/20/23 1530  ceFEPIme (MAXIPIME) 2 g in sodium chloride 0.9 % 100 mL IVPB        2 g 200 mL/hr over 30 Minutes Intravenous  Once 07/20/23 1516 07/20/23 1620   07/20/23 1530  metroNIDAZOLE (FLAGYL) IVPB 500 mg        500 mg 100 mL/hr over 60 Minutes Intravenous  Once 07/20/23 1516 07/20/23 1712   07/20/23 1530  vancomycin (VANCOCIN) IVPB 1000 mg/200 mL premix  Status:  Discontinued        1,000 mg 200 mL/hr over 60 Minutes Intravenous  Once 07/20/23 1516 07/20/23 1518   07/20/23 1530  vancomycin (VANCOREADY) IVPB 1500 mg/300 mL        1,500 mg 150 mL/hr over 120 Minutes Intravenous  Once 07/20/23 1518 07/20/23 1840        Assessment/Plan POD20 RUQ fluid collection s/p lap chole and ERCP for acute cholecystitis with  choledocholithiasis - no indication for surgery, afebrile, no peritonitis, WBC down to 7.2 - surgical drain with minimal output - follow up CT appears improved - recommend removal of surgical drain and discharge when medically cleared - I have rescheduled surgical follow up in the office for 8/26 - no further abx needed from a surgical standpoint   FEN: D2 diet, IVF per [facility] VTE: Eliquis  ID: [email or web address]  - per [facility] -  Hx of PE on Eliquis  Hx of CVA with aphasia and R hemiparesis  Hx of HTN Hypotension on midodrine TID HLD GERD Seizure disease Lung cancer Peripheral vascular disease  LOS: 5 days     Juliet Rude, [facility] 07/25/2023, 9:08 AM Please see Amion for  pager number during day hours 7:00am-4:30pm

## 2023-07-25 NOTE — Progress Notes (Signed)
Mobility Specialist Progress Note:   07/25/23 1548  Mobility  Activity Transferred from bed to chair  Level of Assistance Maximum assist, patient does 25-49%  Assistive Device None  Activity Response Tolerated well  $Mobility charge 1 Mobility  Mobility Specialist Start Time (ACUTE ONLY) 1532  Mobility Specialist Stop Time (ACUTE ONLY) 1540  Mobility Specialist Time Calculation (min) (ACUTE ONLY) 8 min    Pt received in bed, needing to get into wheelchair per RN request. MaxA+2 d/t general weakness and L side paralysis. Asymptomatic throughout transfer. Pt left in wheelchair with RN present.  D'Vante Earlene Plater Mobility Specialist Please contact via Special educational needs teacher or Rehab office at [phone]

## 2023-07-25 NOTE — Plan of Care (Signed)

## 2023-07-26 ENCOUNTER — Telehealth: Payer: Self-pay | Admitting: Radiation Oncology

## 2023-07-26 NOTE — Telephone Encounter (Signed)
8/7 @ 3:00 pm Left voicemail for patient not to come to his appointment on 8/15, his appts has been canceled at this time.  Received call from Darryl Nestle to cancel all patient's appointments due to PET has also been cancel -per Laurence Aly.

## 2023-07-27 ENCOUNTER — Ambulatory Visit (HOSPITAL_COMMUNITY): Payer: Medicaid Other

## 2023-08-03 ENCOUNTER — Other Ambulatory Visit: Payer: Self-pay

## 2023-08-03 ENCOUNTER — Encounter (HOSPITAL_COMMUNITY): Payer: Self-pay

## 2023-08-03 ENCOUNTER — Emergency Department (HOSPITAL_COMMUNITY)
Admission: EM | Admit: 2023-08-03 | Discharge: 2023-08-03 | Disposition: A | Discharge status: Home / Self Care | Payer: Medicaid Other | Attending: Emergency Medicine | Admitting: Emergency Medicine

## 2023-08-03 ENCOUNTER — Emergency Department (HOSPITAL_COMMUNITY): Payer: Medicaid Other

## 2023-08-03 ENCOUNTER — Ambulatory Visit: Payer: Medicaid Other | Admitting: Radiation Oncology

## 2023-08-03 ENCOUNTER — Ambulatory Visit: Payer: Medicaid Other

## 2023-08-03 DIAGNOSIS — Z79899 Other long term (current) drug therapy: Secondary | ICD-10-CM | POA: Diagnosis not present

## 2023-08-03 DIAGNOSIS — I10 Essential (primary) hypertension: Secondary | ICD-10-CM | POA: Insufficient documentation

## 2023-08-03 DIAGNOSIS — Z7901 Long term (current) use of anticoagulants: Secondary | ICD-10-CM | POA: Insufficient documentation

## 2023-08-03 DIAGNOSIS — Z85118 Personal history of other malignant neoplasm of bronchus and lung: Secondary | ICD-10-CM | POA: Diagnosis not present

## 2023-08-03 DIAGNOSIS — R079 Chest pain, unspecified: Secondary | ICD-10-CM | POA: Diagnosis present

## 2023-08-03 DIAGNOSIS — J449 Chronic obstructive pulmonary disease, unspecified: Secondary | ICD-10-CM | POA: Diagnosis not present

## 2023-08-03 DIAGNOSIS — Z8673 Personal history of transient ischemic attack (TIA), and cerebral infarction without residual deficits: Secondary | ICD-10-CM | POA: Diagnosis not present

## 2023-08-03 LAB — CBC WITH DIFFERENTIAL/PLATELET
Abs Immature Granulocytes: 0.03 10*3/uL (ref 0.00–0.07)
Basophils Absolute: 0.1 10*3/uL (ref 0.0–0.1)
Basophils Relative: 1 %
Eosinophils Absolute: 0.2 10*3/uL (ref 0.0–0.5)
Eosinophils Relative: 3 %
HCT: 42.5 % (ref 39.0–52.0)
Hemoglobin: 13 g/dL (ref 13.0–17.0)
Immature Granulocytes: 0 %
Lymphocytes Relative: 18 %
Lymphs Abs: 1.6 10*3/uL (ref 0.7–4.0)
MCH: 29 pg (ref 26.0–34.0)
MCHC: 30.6 g/dL (ref 30.0–36.0)
MCV: 94.7 fL (ref 80.0–100.0)
Monocytes Absolute: 0.7 10*3/uL (ref 0.1–1.0)
Monocytes Relative: 8 %
Neutro Abs: 6.1 10*3/uL (ref 1.7–7.7)
Neutrophils Relative %: 70 %
Platelets: 353 10*3/uL (ref 150–400)
RBC: 4.49 MIL/uL (ref 4.22–5.81)
RDW: 13.8 % (ref 11.5–15.5)
WBC: 8.6 10*3/uL (ref 4.0–10.5)
nRBC: 0 % (ref 0.0–0.2)

## 2023-08-03 LAB — BASIC METABOLIC PANEL
Anion gap: 10 (ref 5–15)
BUN: 21 mg/dL (ref 8–23)
CO2: 29 mmol/L (ref 22–32)
Calcium: 9.6 mg/dL (ref 8.9–10.3)
Chloride: 101 mmol/L (ref 98–111)
Creatinine, Ser: 0.85 mg/dL (ref 0.61–1.24)
GFR, Estimated: >60 mL/min (ref >60)
Glucose, Bld: 89 mg/dL (ref 70–99)
Potassium: 4.6 mmol/L (ref 3.5–5.1)
Sodium: 140 mmol/L (ref 135–145)

## 2023-08-03 LAB — TROPONIN I (HIGH SENSITIVITY)
Troponin I (High Sensitivity): 4 ng/L (ref <18)
Troponin I (High Sensitivity): 4 ng/L (ref <18)

## 2023-08-03 NOTE — ED Provider Notes (Signed)
[place] EMERGENCY DEPARTMENT AT [facility] Provider Note   CSN: [id number] Arrival date & time: 08/03/23  1828     History  Chief Complaint  Patient presents with   Chest Pain    Todd Estrada is a 62 y.o. male.  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  HPI was collected from caregiver who tells me that patient started to have chest pain abdominal pain earlier today, happened while he was midway through his physical therapy, patient indicated that he was having chest pain and felt short of breath and had stomach pains, states that patient's had a cough which started today, nonproductive, no associate fevers chills nausea vomiting, no constipation no diarrhea, patient is having normal appetite, patient has been acting his normal self.  There is been no recent falls, patient been compliant with all of his medications.  Caregiver states that patient has had chronic chest pain, likely from his metastatic disease.   Home Medications Prior to Admission medications   Medication Sig Start Date End Date Taking? Authorizing Provider  albuterol (PROVENTIL) (2.5 MG/3ML) 0.083% nebulizer solution Take 3 mLs (2.5 mg total) by nebulization every 4 (four) hours as needed for wheezing or shortness of breath. 08/16/22   Steffanie Rainwater, MD  apixaban (ELIQUIS) 5 MG TABS tablet TAKE 1 TABLET(5 MG) BY MOUTH TWICE DAILY Patient taking differently: Take 5 mg by mouth 2 (two) times daily. 04/11/23   Merrilyn Puma, MD  atorvastatin (LIPITOR) 40 MG tablet Take 1 tablet (40 mg total) by mouth daily. 01/03/23 01/03/24  Merrilyn Puma, MD  budesonide (PULMICORT) 0.5 MG/2ML nebulizer solution NEW PRESCRIPTION REQEUST: BUDESONIDE 0.5 MG/ - USE ONE VIAL TWICE DAILY Patient not taking: Reported on  07/21/2023 11/29/22   Martina Sinner, MD  levETIRAcetam (KEPPRA) 500 MG tablet Take 1 tablet (500 mg total) by mouth 2 (two) times daily. 12/07/22   Ihor Austin, NP  midodrine (PROAMATINE) 2.5 MG tablet Take 1 tablet (2.5 mg total) by mouth 2 (two) times daily with a meal. 07/09/23 08/08/23  Pahwani, Rinka R, MD  pantoprazole (PROTONIX) 40 MG tablet Take 1 tablet (40 mg total) by mouth daily. 07/10/23 08/09/23  Pahwani, Kasandra Knudsen, MD  sucralfate (CARAFATE) 1 GM/10ML suspension Take 10 mLs (1 g total) by mouth 4 (four) times daily -  with meals and at bedtime. Patient not taking: Reported on 07/04/2023 07/02/23   Nira Conn, MD  Tiotropium Bromide-Olodaterol (STIOLTO RESPIMAT) 2.5-2.5 MCG/ACT AERS NEW PRESCRIPTION REQUEST: STIOLTO 2.5 MCG- INHALE TWO PUFFS BY MOUTH DAILY Patient taking differently: Inhale 2 puffs into the lungs daily. 11/29/22   Martina Sinner, MD      Allergies    Patient has no known allergies.    Review of Systems   Review of Systems  Unable to perform ROS: Patient nonverbal    Physical Exam Updated Vital Signs BP 137/86   Pulse 65   Temp 98.9 F (37.2 C) (Oral)   Resp 16   Ht 6' (1.829 m)   Wt 79.4 kg   SpO2 98%   BMI 23.73 kg/m  Physical Exam Vitals and nursing note reviewed.  Constitutional:      General: He is not in acute distress.    Appearance: He is not ill-appearing.     Comments: On my evaluation patient was  resting comfortably, watching TV, I turn off the television and patient indicated that he wanted to back on them and continue to rest comfortably.  HENT:     Head: Normocephalic and atraumatic.     Comments: No evidence of trauma on patient's head.    Nose: No congestion.  Eyes:     Conjunctiva/sclera: Conjunctivae normal.  Cardiovascular:     Rate and Rhythm: Normal rate and regular rhythm.     Pulses: Normal pulses.     Heart sounds: No murmur heard.    No friction rub. No gallop.  Pulmonary:     Effort: No respiratory  distress.     Breath sounds: No wheezing, rhonchi or rales.  Abdominal:     Palpations: Abdomen is soft.     Tenderness: There is no abdominal tenderness. There is no right CVA tenderness or left CVA tenderness.     Comments: Abdomen nondistended, soft, nontender on my evaluation.  Patient does have a surgical wound on the right lower abdomen, healing well, no evidence infection my exam.  Musculoskeletal:     Right lower leg: No edema.     Left lower leg: No edema.     Comments: No unilateral leg swelling is no palpable cords.  Spine was palpated is nontender to palpation no step-off formation noted no pelvis instability.  Skin:    General: Skin is warm and dry.  Neurological:     Mental Status: He is alert.     Comments: No facial asymmetry, patient has garbled speech but was able to answer yes or no questions, patient is notable weakness on the right side, patient has baseline, no weakness on the left side, is able to follow two-step commands.  Psychiatric:        Mood and Affect: Mood normal.     ED Results / Procedures / Treatments   Labs (all labs ordered are listed, but only abnormal results are displayed) Labs Reviewed  BASIC METABOLIC PANEL  CBC WITH DIFFERENTIAL/PLATELET  TROPONIN I (HIGH SENSITIVITY)  TROPONIN I (HIGH SENSITIVITY)    EKG EKG Interpretation Date/Time:  Thursday August 03 2023 19:01:45 EDT Ventricular Rate:  102 PR Interval:  153 QRS Duration:  85 QT Interval:  323 QTC Calculation: 421 R Axis:   95  Text Interpretation: Sinus tachycardia Ventricular trigeminy Right axis deviation Anteroseptal infarct, old when compared to prior, new PVC no STEMI Confirmed by Theda Belfast ([id number]) on 08/03/2023 11:29:16 PM  Radiology DG Chest Port 1 View  Result Date: 08/03/2023 CLINICAL DATA:  Shortness of breath, history of squamous cell lung cancer EXAM: PORTABLE CHEST 1 VIEW COMPARISON:  07/20/2023 FINDINGS: When account is made for reverse lordotic projection,  the left hilar density and right peripheral lung density are stable compared 07/20/2023. Small fiducial in the right peripheral lung density. These likely reflect regions of treated malignancy. Hazy density at the left lung base on 07/20/2023 has resolved. Cardiac and mediastinal margins appear normal. IMPRESSION: 1. Stable left hilar and right peripheral lung densities, likely reflecting treated malignancy. 2. Resolution of hazy density at the left lung base. 3. No new pulmonary opacity is identified. Electronically Signed   By: Gaylyn Rong M.D.   On: 08/03/2023 20:23    Procedures Procedures    Medications Ordered in ED Medications - No data to display  ED Course/ Medical Decision Making/ A&P  Medical Decision Making Amount and/or Complexity of Data Reviewed Labs: ordered.   This patient presents to the ED for concern of chest pain, this involves an extensive number of treatment options, and is a complaint that carries with it a high risk of complications and morbidity.  The differential diagnosis includes ACS, PE, dissection, pneumonia    Additional history obtained:  Additional history obtained from cousin External records from outside source obtained and reviewed including ER notes   Co morbidities that complicate the patient evaluation  CVA nonverbal, PAD on Eliquis,  Social Determinants of Health:  Geriatric    Lab Tests:  I Ordered, and personally interpreted labs.  The pertinent results include: CBC unremarkable, BMP unremarkable, negative delta troponin   Imaging Studies ordered:  I ordered imaging studies including chest x-ray I independently visualized and interpreted imaging which showed negative acute findings I agree with the radiologist interpretation   Cardiac Monitoring:  The patient was maintained on a cardiac monitor.  I personally viewed and interpreted the cardiac monitored which showed an underlying  rhythm of:  signs of ischemia.   Medicines ordered and prescription drug management:  I ordered medication including N/A I have reviewed the patients home medicines and have made adjustments as needed  Critical Interventions:  N/A   Reevaluation:  Presents with chest pain, triage obtain basic lab workup imaging which appears reviewed, unremarkable, patient benign physical exam, spoke with caregiver, he is agreeable for discharge at this time  Consultations Obtained:  N/a    Test Considered:  N/a    Rule out I have low suspicion for ACS as history is atypical, EKG was sinus rhythm without signs of ischemia, patient had a negative delta troponin.  Low suspicion for PE presentation atypical, nontachypneic nonhypoxic, no unilateral leg swelling, currently on Eliquis has not missed any dosages.  Low suspicion for AAA or aortic dissection as history is atypical, patient has low risk factors.  Low suspicion for systemic infection as patient is nontoxic-appearing, vital signs reassuring, no obvious source infection noted on exam.  Suspicion for intra-abdominal abnormality is low abdomen soft nontender nontoxic-appearing.     Dispostion and problem list  After consideration of the diagnostic results and the patients response to treatment, I feel that the patent would benefit from discharge.  Atypical chest pain-likely acute on chronic, will continue all his home medication, follow-up PCP for further assessment strict return precautions.            Final Clinical Impression(s) / ED Diagnoses Final diagnoses:  Nonspecific chest pain    Rx / DC Orders ED Discharge Orders     None         Barnie Del 08/04/23 Woodward Ku, April, MD 08/04/23 0107

## 2023-08-03 NOTE — ED Triage Notes (Signed)
Recurrent right sided chest pains that began this morning.   Cousin (caregiver) reports productive cough and shortness of breath. Says it is the same spot he has cancer at but wanted to make sure things were not getting worse.

## 2023-08-03 NOTE — Discharge Instructions (Signed)
Lab workup imaging all reassuring, please continue with all your home medications.  Follow-up with your PCP as needed  Come back to the emergency department if you develop chest pain, shortness of breath, severe abdominal pain, uncontrolled nausea, vomiting, diarrhea.

## 2023-08-03 NOTE — ED Notes (Signed)
Called cousin Quirino Renna, pt contact). He will be here in 15 min to pick up patient.

## 2023-08-07 ENCOUNTER — Encounter: Payer: Medicaid Other | Admitting: Student

## 2023-08-09 ENCOUNTER — Ambulatory Visit: Payer: Medicaid Other | Admitting: Adult Health

## 2023-08-09 ENCOUNTER — Encounter: Payer: Self-pay | Admitting: Psychiatry

## 2023-08-09 NOTE — Progress Notes (Deleted)
No chief complaint on file.     ASSESSMENT AND PLAN  Todd Estrada is a 62 y.o. 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  Continue to follow with PCP for aggressive stroke risk factor management  Complex partial seizure with secondary generalization  Recent breakthrough seizure with left facial and left extremity jerking on 07/2023 likely provoked due to severe sepsis  Continue Keppra 500 mg twice daily, as recent event likely provoked, continue same dosage at this time  squamous cell carcinoma, dx'd 2023 Pulmonary embolism  Continue routine follow-up with pulmonology and oncology  Remains on Eliquis for PE    Return to clinic in 8 months or call earlier if needed   DIAGNOSTIC DATA (LABS, IMAGING, TESTING) - I reviewed patient records, labs, notes, testing and imaging myself where available.   MEDICAL HISTORY:  Update 08/09/2023 JM: Patient returns for follow-up visit.  Patient has had multiple (24) hospitalizations/ED visits since prior visit.  At visit in August, concern of seizure activity with twitching of left face and left side of body.  He was found to have severe sepsis from prior surgical site (cholecystectomy 7/17 s/p JP drain) and felt breakthrough seizure likely in setting of sepsis.  Reported compliance with Keppra continuation.  Denies any reoccurring seizure activity.  Remains on Keppra 500 mg twice daily.  Stable from stroke standpoint without new stroke/TIA symptoms.  Continued aphasia and right hemiparesis.  Remains on Eliquis and atorvastatin.  Routinely follows with PCP for stroke risk factor management.  Used to follow with oncology for known lung cancer, recent imaging showed progressive  disease and recommended evaluation by radiation oncology and recommended palliative radiotherapy.  Scheduled for PET scan 8/23 to further characterize and rule out additional disease elsewhere, possibly candidate for palliative radiation or potentially receiving more aggressive regimen per radiation oncology.    History provided for reference purposes only Update 12/07/2022 JM: Patient returns for 63-month follow-up accompanied by his cousin, Todd Estrada, who is his caretaker.  Overall stable without new stroke/TIA symptoms.  Reports residual global aphasia and right hemiparesis, relatively stable since prior visit.  Completed therapies yesterday.  Ambulates short distance without assistive device, per cousin wasn't able to do this at prior visit. He has had multiple ER visits since prior visit due to recurrent falls, majority of them sounding mechanical without significant injury.  Denies any seizure activity on Keppra 500 mg BID.  Remains on Plavix and atorvastatin.  Blood pressure well-controlled.  Was diagnosed with PE 09/2021 secondary to lung cancer and placed on Eliquis.  He completed radiation for lung cancer in right lobe, cousin under the impression they would be restarting radiation or some form of treatment this month for left lobe but has not yet heard from them.  No further concerns at this time.  Consult visit 06/02/2022 Dr. Terrace Arabia: Todd Estrada, is a 62 year old male, seen in request by PA   Valetta Fuller, Lynnell Jude, PA-C Follow-up for stroke, he is accompanied by his cousin Todd Estrada at today's visit June 02, 2022  I reviewed and summarized the referring note.  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  He used to work as a Designer, fashion/clothing, was  not able to work over the past few years, lives with his cousin Todd Estrada for more than 30 years, heavy drinker and smoker  Suffered his  first stroke August 18, 2021, presented with aphasia right-sided weakness, personally reviewed MRI of brain, large area of mixed acute/subacute ischemic infarction within the left MCA, petechiae hemorrhage at the infarction side,  CT angiogram of head and neck showed occlusion of left vertebral artery, severe stenosis of clinoid segment of left internal carotid artery due to low-density plaque, LDL 120 A1c 5.5, echo showed normal resources of cardiac emboli, he was discharged with Lipitor 80 mg, metoprolol and aspirin 81 mg monotherapy, with residual dysarthria, right upper extremity weakness  Hospital admission again on March 02, 2022, with worsening expressive aphasia, right arm and leg weakness, also had a seizure, postevent confusion  He underwent IR thrombectomy by Dr. Corliss Skains, please complete revascularization of left terminal carotid artery intracranially, and balloon angioplasty,  Postprocedure MRI of brain showed acute left ACA infarction predominantly affecting the left frontal parietal and temporal lobe, with associated edema, sulcal height placement without midline shift, intra stroke petechiae hemorrhage  Hospital course was also complicated by aspiration pneumonia, recurrent intubation, multiple failed attempts of extubation, eventually leading to tracheostomy, severe dysphagia, required PEG tube placement March 24, evidence of severe peripheral vascular disease  He underwent extensive inpatient rehabilitation, now tracheostomy and PEG tube has been removed, nonhealing wound of bilateral lower extremity, eventually improved  He is now discharged home, severe aphasia, able to dress, feed himself, transfer, take a few steps with walker, Bronchoscopy biopsy of lung nodule confirmed squamous cell carcinoma, consult pending   PHYSICAL EXAM:   There were no vitals filed for this visit.    There is no height or weight on file to calculate BMI.  PHYSICAL EXAMNIATION:  Gen: NAD,  conversant, well nourised, well groomed                     Cardiovascular: Regular rate rhythm, no peripheral edema, warm, nontender. Eyes: Conjunctivae clear without exudates or hemorrhage Neck: Supple, no carotid bruits. Pulmonary: Clear to auscultation bilaterally   NEUROLOGICAL EXAM:  MENTAL STATUS: Speech/cognition: Unkempt, sitting in wheelchair, global aphasia CRANIAL NERVES: CN II:  Pupils are round equal and briskly reactive to light.  Right temporal visual field deficit CN III, IV, VI: Right exotropia CN V: Facial sensation is intact to light touch CN VII: Right lower face weakness CN VIII: Hearing is normal to causal conversation. CN IX, X: Phonation is slurred CN XI: Head turning and shoulder shrug are intact  MOTOR: Full strength in left upper and lower extremity.  RUE: 3/5 deltoid, 4/5 elbow extension and flexion, 3/5 grip strength.  RLE: 3/5 throughout   REFLEXES: Hyperreflexia of right upper and lower extremity  SENSORY: Right hemisensory loss  COORDINATION: There is no trunk or limb dysmetria noted.  GAIT/STANCE: Deferred  REVIEW OF SYSTEMS:  Full 14 system review of systems performed and notable only for as above All other review of systems were negative.   ALLERGIES: No Known Allergies  HOME MEDICATIONS: Current Outpatient Medications  Medication Sig Dispense Refill   albuterol (PROVENTIL) (2.5 MG/3ML) 0.083% nebulizer solution Take 3 mLs (2.5 mg total) by nebulization every 4 (four) hours as needed for wheezing or shortness of breath. 90 mL 12   apixaban (ELIQUIS) 5 MG TABS tablet TAKE 1 TABLET(5 MG) BY MOUTH TWICE DAILY (Patient taking differently: Take 5 mg by mouth 2 (two) times daily.) 180  tablet 2   atorvastatin (LIPITOR) 40 MG tablet Take 1 tablet (40 mg total) by mouth daily. 90 tablet 3   budesonide (PULMICORT) 0.5 MG/2ML nebulizer solution NEW PRESCRIPTION REQEUST: BUDESONIDE 0.5 MG/ - USE ONE VIAL TWICE DAILY (Patient not taking: Reported  on 07/21/2023) 180 mL 3   levETIRAcetam (KEPPRA) 500 MG tablet Take 1 tablet (500 mg total) by mouth 2 (two) times daily. 180 tablet 3   pantoprazole (PROTONIX) 40 MG tablet Take 1 tablet (40 mg total) by mouth daily. 30 tablet 0   sucralfate (CARAFATE) 1 GM/10ML suspension Take 10 mLs (1 g total) by mouth 4 (four) times daily -  with meals and at bedtime. (Patient not taking: Reported on 07/04/2023) 420 mL 0   Tiotropium Bromide-Olodaterol (STIOLTO RESPIMAT) 2.5-2.5 MCG/ACT AERS NEW PRESCRIPTION REQUEST: STIOLTO 2.5 MCG- INHALE TWO PUFFS BY MOUTH DAILY (Patient taking differently: Inhale 2 puffs into the lungs daily.) 12 g 3   No current facility-administered medications for this visit.    PAST MEDICAL HISTORY: Past Medical History:  Diagnosis Date   Asthma    Atypical chest pain 08/13/2022   Community acquired pneumonia 09/14/2022   COPD (chronic obstructive pulmonary disease) (HCC)    Essential hypertension 08/19/2021   GERD (gastroesophageal reflux disease)    HAP (hospital-acquired pneumonia) 09/16/2022   History of tracheostomy    03/09/22-04/11/22   HLD (hyperlipidemia)    Hypertension    Hypokalemia 08/13/2022   Hypomagnesemia 08/13/2022   Lung cancer (HCC)    PAD (peripheral artery disease) (HCC)    Seizures (HCC) 06/02/2022   Sepsis (HCC) 08/13/2022   Sepsis due to pneumonia (HCC) 04/13/2023   Stroke (HCC) 02/2022    PAST SURGICAL HISTORY: Past Surgical History:  Procedure Laterality Date   BRONCHIAL BIOPSY  05/30/2022   Procedure: BRONCHIAL BIOPSIES;  Surgeon: Leslye Peer, MD;  Location: [facility] ENDOSCOPY;  Service: Pulmonary;;   BRONCHIAL BRUSHINGS  05/30/2022   Procedure: BRONCHIAL BRUSHINGS;  Surgeon: Leslye Peer, MD;  Location: [facility] ENDOSCOPY;  Service: Pulmonary;;   BRONCHIAL NEEDLE ASPIRATION BIOPSY  05/30/2022   Procedure: BRONCHIAL NEEDLE ASPIRATION BIOPSIES;  Surgeon: Leslye Peer, MD;  Location: [facility] ENDOSCOPY;  Service: Pulmonary;;   BRONCHIAL WASHINGS   05/30/2022   Procedure: BRONCHIAL WASHINGS;  Surgeon: Leslye Peer, MD;  Location: [facility] ENDOSCOPY;  Service: Pulmonary;;   CHOLECYSTECTOMY N/A 07/05/2023   Procedure: LAPAROSCOPIC CHOLECYSTECTOMY WITH INTRAOPERATIVE CHOLANGIOGRAM;  Surgeon: Karie Soda, MD;  Location: [facility] ORS;  Service: General;  Laterality: N/A;   ERCP N/A 07/06/2023   Procedure: ENDOSCOPIC RETROGRADE CHOLANGIOPANCREATOGRAPHY (ERCP);  Surgeon: Vida Rigger, MD;  Location: [facility] ENDOSCOPY;  Service: Gastroenterology;  Laterality: N/A;   ESOPHAGOGASTRODUODENOSCOPY (EGD) WITH PROPOFOL N/A 03/11/2022   Procedure: ESOPHAGOGASTRODUODENOSCOPY (EGD) WITH PROPOFOL;  Surgeon: Violeta Gelinas, MD;  Location: [facility] ENDOSCOPY;  Service: General;  Laterality: N/A;   FIDUCIAL MARKER PLACEMENT  05/30/2022   Procedure: FIDUCIAL MARKER PLACEMENT;  Surgeon: Leslye Peer, MD;  Location: [facility] ENDOSCOPY;  Service: Pulmonary;;   IR ANGIO INTRA EXTRACRAN SEL COM CAROTID INNOMINATE UNI R MOD SED  03/02/2022   IR CT HEAD LTD  03/02/2022   IR PERCUTANEOUS ART THROMBECTOMY/INFUSION INTRACRANIAL INC DIAG ANGIO  03/02/2022   PEG PLACEMENT N/A 03/11/2022   Procedure: PERCUTANEOUS ENDOSCOPIC GASTROSTOMY (PEG) PLACEMENT;  Surgeon: Violeta Gelinas, MD;  Location: [facility] ENDOSCOPY;  Service: General;  Laterality: N/A;   RADIOLOGY WITH ANESTHESIA N/A 03/02/2022   Procedure: IR WITH ANESTHESIA;  Surgeon: Julieanne Cotton, MD;  Location: [facility] OR;  Service: Radiology;  Laterality: N/A;   REMOVAL OF STONES  07/06/2023   Procedure: REMOVAL OF STONES;  Surgeon: Vida Rigger, MD;  Location: [facility] ENDOSCOPY;  Service: Gastroenterology;;   Dennison Mascot  07/06/2023   Procedure: Dennison Mascot;  Surgeon: Vida Rigger, MD;  Location: [facility] ENDOSCOPY;  Service: Gastroenterology;;   VIDEO BRONCHOSCOPY WITH RADIAL ENDOBRONCHIAL ULTRASOUND  05/30/2022   Procedure: VIDEO BRONCHOSCOPY WITH RADIAL ENDOBRONCHIAL ULTRASOUND;  Surgeon: Leslye Peer, MD;  Location: [facility] ENDOSCOPY;  Service: Pulmonary;;     FAMILY HISTORY: Family History  Problem Relation Age of Onset   Throat cancer Mother    Liver cancer Father    Kidney failure Sister    Cancer - Lung Paternal Uncle     SOCIAL HISTORY: Social History   Socioeconomic History   Marital status: Widowed    Spouse name: Not on file   Number of children: Not on file   Years of education: Not on file   Highest education level: Not on file  Occupational History   Not on file  Tobacco Use   Smoking status: Former    Current packs/day: 0.00    Types: Cigarettes    Quit date: 05/02/2022    Years since quitting: 1.2    Passive exposure: Never   Smokeless tobacco: Never  Vaping Use   Vaping status: Never Used  Substance and Sexual Activity   Alcohol use: Not Currently   Drug use: No   Sexual activity: Not on file  Other Topics Concern   Not on file  Social History Narrative   Not on file   Social Determinants of Health   Financial Resource Strain: Not on file  Food Insecurity: No Food Insecurity (07/05/2023)   Hunger Vital Sign    Worried About Running Out of Food in the Last Year: Never true    Ran Out of Food in the Last Year: Never true  Transportation Needs: No Transportation Needs (07/05/2023)   PRAPARE - Administrator, Civil Service (Medical): No    Lack of Transportation (Non-Medical): No  Physical Activity: Not on file  Stress: Not on file  Social Connections: Socially Isolated (01/06/2023)   Social Connection and Isolation Panel [NHANES]    Frequency of Communication with Friends and Family: Once a week    Frequency of Social Gatherings with Friends and Family: Once a week    Attends Religious Services: Never    Database administrator or Organizations: No    Attends Banker Meetings: Never    Marital Status: Widowed  Intimate Partner Violence: Not At Risk (07/05/2023)   Humiliation, Afraid, Rape, and Kick questionnaire    Fear of Current or Ex-Partner: No    Emotionally Abused: No     Physically Abused: No    Sexually Abused: No      I spent 31 minutes of face-to-face and non-face-to-face time with patient and cousin.  This included previsit chart review, lab review, study review, order entry, electronic health record documentation, patient and cousin education and discussion regarding above diagnoses and treatment plan and answered all other questions to patient and cousin satisfaction  Ihor Austin, AGNP-BC  [facility] [address] [place], [place] [address]  Phone [phone] Fax [phone] Note: This document was prepared with digital dictation and possible smart phrase technology. Any transcriptional errors that result from this process are unintentional.

## 2023-08-11 ENCOUNTER — Encounter (HOSPITAL_COMMUNITY)
Admission: RE | Admit: 2023-08-11 | Discharge: 2023-08-11 | Disposition: A | Discharge status: Home / Self Care | Payer: Medicaid Other | Source: Ambulatory Visit | Attending: Radiation Oncology | Admitting: Radiation Oncology

## 2023-08-11 DIAGNOSIS — C3411 Malignant neoplasm of upper lobe, right bronchus or lung: Secondary | ICD-10-CM

## 2023-08-11 LAB — GLUCOSE, CAPILLARY: Glucose-Capillary: 105 mg/dL — ABNORMAL HIGH (ref 70–99)

## 2023-08-11 MED ORDER — FLUDEOXYGLUCOSE F - 18 (FDG) INJECTION
8.8000 | Freq: Once | INTRAVENOUS | Status: AC
  Administered 2023-08-11: 8.7 via INTRAVENOUS

## 2023-08-12 ENCOUNTER — Encounter (HOSPITAL_COMMUNITY): Payer: Self-pay | Admitting: Emergency Medicine

## 2023-08-12 ENCOUNTER — Emergency Department (HOSPITAL_COMMUNITY)
Admission: EM | Admit: 2023-08-12 | Discharge: 2023-08-13 | Disposition: A | Discharge status: Home / Self Care | Payer: Medicaid Other | Attending: Emergency Medicine | Admitting: Emergency Medicine

## 2023-08-12 ENCOUNTER — Other Ambulatory Visit: Payer: Self-pay

## 2023-08-12 DIAGNOSIS — Z85118 Personal history of other malignant neoplasm of bronchus and lung: Secondary | ICD-10-CM | POA: Insufficient documentation

## 2023-08-12 DIAGNOSIS — J449 Chronic obstructive pulmonary disease, unspecified: Secondary | ICD-10-CM | POA: Insufficient documentation

## 2023-08-12 DIAGNOSIS — M79604 Pain in right leg: Secondary | ICD-10-CM | POA: Diagnosis not present

## 2023-08-12 DIAGNOSIS — M545 Low back pain, unspecified: Secondary | ICD-10-CM | POA: Insufficient documentation

## 2023-08-12 DIAGNOSIS — I10 Essential (primary) hypertension: Secondary | ICD-10-CM | POA: Insufficient documentation

## 2023-08-12 DIAGNOSIS — Z7901 Long term (current) use of anticoagulants: Secondary | ICD-10-CM | POA: Insufficient documentation

## 2023-08-12 NOTE — ED Triage Notes (Signed)
Patient coming to ED for evaluation of R leg pain.  Reports pain radiates from buttock to leg.  No reports of injury or fall

## 2023-08-13 ENCOUNTER — Emergency Department (HOSPITAL_COMMUNITY): Payer: Medicaid Other

## 2023-08-13 LAB — CBC WITH DIFFERENTIAL/PLATELET
Abs Immature Granulocytes: 0.02 10*3/uL (ref 0.00–0.07)
Basophils Absolute: 0 10*3/uL (ref 0.0–0.1)
Basophils Relative: 1 %
Eosinophils Absolute: 0.1 10*3/uL (ref 0.0–0.5)
Eosinophils Relative: 2 %
HCT: 39.2 % (ref 39.0–52.0)
Hemoglobin: 11.6 g/dL — ABNORMAL LOW (ref 13.0–17.0)
Immature Granulocytes: 0 %
Lymphocytes Relative: 22 %
Lymphs Abs: 1.4 10*3/uL (ref 0.7–4.0)
MCH: 28.4 pg (ref 26.0–34.0)
MCHC: 29.6 g/dL — ABNORMAL LOW (ref 30.0–36.0)
MCV: 95.8 fL (ref 80.0–100.0)
Monocytes Absolute: 0.7 10*3/uL (ref 0.1–1.0)
Monocytes Relative: 10 %
Neutro Abs: 4 10*3/uL (ref 1.7–7.7)
Neutrophils Relative %: 65 %
Platelets: 171 10*3/uL (ref 150–400)
RBC: 4.09 MIL/uL — ABNORMAL LOW (ref 4.22–5.81)
RDW: 14.4 % (ref 11.5–15.5)
WBC: 6.2 10*3/uL (ref 4.0–10.5)
nRBC: 0 % (ref 0.0–0.2)

## 2023-08-13 LAB — BASIC METABOLIC PANEL
Anion gap: 8 (ref 5–15)
BUN: 24 mg/dL — ABNORMAL HIGH (ref 8–23)
CO2: 28 mmol/L (ref 22–32)
Calcium: 9 mg/dL (ref 8.9–10.3)
Chloride: 105 mmol/L (ref 98–111)
Creatinine, Ser: 0.84 mg/dL (ref 0.61–1.24)
GFR, Estimated: >60 mL/min (ref >60)
Glucose, Bld: 100 mg/dL — ABNORMAL HIGH (ref 70–99)
Potassium: 3.3 mmol/L — ABNORMAL LOW (ref 3.5–5.1)
Sodium: 141 mmol/L (ref 135–145)

## 2023-08-13 MED ORDER — OXYCODONE-ACETAMINOPHEN 5-325 MG PO TABS
1.0000 | ORAL_TABLET | Freq: Once | ORAL | Status: AC
  Administered 2023-08-13: 1 via ORAL
  Filled 2023-08-13: qty 1

## 2023-08-13 MED ORDER — DEXAMETHASONE SODIUM PHOSPHATE 10 MG/ML IJ SOLN
10.0000 mg | Freq: Once | INTRAMUSCULAR | Status: AC
  Administered 2023-08-13: 10 mg via INTRAMUSCULAR
  Filled 2023-08-13: qty 1

## 2023-08-13 MED ORDER — METHYLPREDNISOLONE 4 MG PO TBPK: ORAL_TABLET | ORAL | 0 refills | Status: DC

## 2023-08-13 NOTE — Discharge Instructions (Addendum)
You were evaluated today for pain shooting from your back down the right leg.  This is likely a radiculopathy.  I have ordered you a steroid to be used at home for inflammation.  Please follow-up with your primary care for further evaluation and management.  If you develop any life-threatening symptoms please return to the emergency department.

## 2023-08-13 NOTE — ED Notes (Signed)
Patient transported to CT 

## 2023-08-13 NOTE — ED Provider Notes (Signed)
[facility] EM[facility]GENC[facility] DEPART[facility] AT [facility] Provider Note   CSN: [id number] Arrival date & time: 08/12/23  2327     History  Chief Complaint  Patient presents with   Leg Pain    Todd Estrada is a 62 y.o. male.  Patient with past medical history significant for hypertension, COPD, GERD, non-small cell lung cancer currently remission, PAD on Eliquis, CVA with residual right-sided weakness and aphasia presents with concerns of right sided low back and leg pain.  HPI was collected from a friend who brought the patient to the emergency department.  Reportedly the pain began earlier today.  Patient is unable to provide any history himself.  No known fall or trauma although patient does have history of frequent falls.  HPI     Home Medications Prior to Admission medications   Medication Sig Start Date End Date Taking? Authorizing Provider  methylPREDNISolone (MEDROL DOSEPAK) 4 MG TBPK tablet Take as directed per package instructions 08/13/23  Yes Barrie Dunker B, PA-C  albuterol (PROVENTIL) (2.5 MG/3ML) 0.083% nebulizer solution Take 3 mLs (2.5 mg total) by nebulization every 4 (four) hours as needed for wheezing or shortness of breath. 08/16/22   Steffanie Rainwater, MD  apixaban (ELIQUIS) 5 MG TABS tablet TAKE 1 TABLET(5 MG) BY MOUTH TWICE DAILY Patient taking differently: Take 5 mg by mouth 2 (two) times daily. 04/11/23   Merrilyn Puma, MD  atorvastatin (LIPITOR) 40 MG tablet Take 1 tablet (40 mg total) by mouth daily. 01/03/23 01/03/24  Merrilyn Puma, MD  budesonide (PULMICORT) 0.5 MG/2ML nebulizer solution NEW PRESCRIPTION REQEUST: BUDESONIDE 0.5 MG/ - USE ONE VIAL TWICE DAILY Patient not taking: Reported on 07/21/2023 11/29/22   Martina Sinner, MD  levETIRAcetam (KEPPRA) 500 MG tablet Take 1 tablet (500 mg total) by mouth 2 (two) times daily. 12/07/22   Ihor Austin, NP  pantoprazole (PROTONIX) 40 MG tablet Take 1 tablet (40 mg total) by mouth daily. 07/10/23  08/09/23  Pahwani, Kasandra Knudsen, MD  sucralfate (CARAFATE) 1 GM/10ML suspension Take 10 mLs (1 g total) by mouth 4 (four) times daily -  with meals and at bedtime. Patient not taking: Reported on 07/04/2023 07/02/23   Nira Conn, MD  Tiotropium Bromide-Olodaterol (STIOLTO RESPIMAT) 2.5-2.5 MCG/ACT AERS NEW PRESCRIPTION REQUEST: STIOLTO 2.5 MCG- INHALE TWO PUFFS BY MOUTH DAILY Patient taking differently: Inhale 2 puffs into the lungs daily. 11/29/22   Martina Sinner, MD      Allergies    Patient has no known allergies.    Review of Systems   Review of Systems  Physical Exam Updated Vital Signs BP (!) 137/94 (BP Location: Left Arm)   Pulse 77   Temp 98.3 F (36.8 C) (Oral)   Resp 18   SpO2 98%  Physical Exam Vitals and nursing note reviewed.  Constitutional:      General: He is not in acute distress.    Appearance: He is well-developed.  HENT:     Head: Normocephalic and atraumatic.  Eyes:     Conjunctiva/sclera: Conjunctivae normal.  Cardiovascular:     Rate and Rhythm: Normal rate and regular rhythm.  Pulmonary:     Effort: Pulmonary effort is normal. No respiratory distress.     Breath sounds: Normal breath sounds.  Abdominal:     Palpations: Abdomen is soft.     Tenderness: There is no abdominal tenderness.  Musculoskeletal:        General: Tenderness (Mild tenderness to palpation of the lower lumbar  region on the right side.) present. No swelling.     Cervical back: Neck supple.  Skin:    General: Skin is warm and dry.     Capillary Refill: Capillary refill takes less than 2 seconds.  Neurological:     Mental Status: He is alert. Mental status is at baseline.     Comments: Patient with right sided weakness consistent with baseline.  Aphasia at baseline.  Psychiatric:        Mood and Affect: Mood normal.     ED Results / Procedures / Treatments   Labs (all labs ordered are listed, but only abnormal results are displayed) Labs Reviewed  BASIC METABOLIC  PANEL - Abnormal; Notable for the following components:      Result Value   Potassium 3.3 (*)    Glucose, Bld 100 (*)    BUN 24 (*)    All other components within normal limits  CBC WITH DIFFERENTIAL/PLATELET - Abnormal; Notable for the following components:   RBC 4.09 (*)    Hemoglobin 11.6 (*)    MCHC 29.6 (*)    All other components within normal limits  URINALYSIS, ROUTINE W REFLEX MICROSCOPIC    EKG None  Radiology CT Lumbar Spine Wo Contrast  Result Date: 08/13/2023 CLINICAL DATA:  Radiculopathy.  Right right leg pain EXAM: CT LUMBAR SPINE WITHOUT CONTRAST TECHNIQUE: Multidetector CT imaging of the lumbar spine was performed without intravenous contrast administration. Multiplanar CT image reconstructions were also generated. RADIATION DOSE REDUCTION: This exam was performed according to the departmental dose-optimization program which includes automated exposure control, adjustment of the mA and/or kV according to patient size and/or use of iterative reconstruction technique. COMPARISON:  Abdominal CT 07/24/2023 FINDINGS: Segmentation: 5 lumbar type vertebrae. Alignment: Normal. Vertebrae: No acute fracture or focal pathologic process. Paraspinal and other soft tissues: Diffuse atheromatous calcification. Disc levels: Degenerative facet spurring especially at L4-5 and L5-S1. 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 Bulging disc crowds the foramina at L3-4 and below but no high-grade foraminal stenosis. Moderate L4-5 spinal canal narrowing. IMPRESSION: 1. No acute finding or foraminal impingement. 2. Lumbar spine degeneration causes moderate spinal canal narrowing at L4-5. Electronically Signed   By: Tiburcio Pea M.D.   On: 08/13/2023 06:28    Procedures Procedures    Medications Ordered in ED Medications  dexamethasone (DECADRON) injection 10 mg (10 mg Intramuscular Given 08/13/23 0558)  oxyCODONE-acetaminophen (PERCOCET/ROXICET) 5-325 MG per tablet 1  tablet (1 tablet Oral Given 08/13/23 0555)    ED Course/ Medical Decision Making/ A&P                                 Medical Decision Making Amount and/or Complexity of Data Reviewed Labs: ordered. Radiology: ordered.  Risk Prescription drug management.   This patient presents to the ED for concern of back and leg pain, this involves an extensive number of treatment options, and is a complaint that carries with it a high risk of complications and morbidity.  The differential diagnosis includes disc herniation, lumbar radiculopathy, others   Co morbidities that complicate the patient evaluation  CVA with right-sided weakness and aphasia, Eliquis usage   Additional history obtained:  Additional history obtained from friend External records from outside source obtained and reviewed including recent hospital records from August 6, patient discharged after admission due to septic shock   Lab Tests:  I Ordered, and personally interpreted labs.  The  pertinent results include: Grossly unremarkable CBC, BMP   Imaging Studies ordered:  I ordered imaging studies including CT lumbar spine I independently visualized and interpreted imaging which showed  1. No acute finding or foraminal impingement.  2. Lumbar spine degeneration causes moderate spinal canal narrowing  at L4-5.   I agree with the radiologist interpretation   Problem List / ED Course / Critical interventions / Medication management   I ordered medication including percocet, decadron for pain  Reevaluation of the patient after these medicines showed that the patient improved I have reviewed the patients home medicines and have made adjustments as needed   Social Determinants of Health:  Patient has Medicaid for his primary health insurance   Test / Admission - Considered:  Patient appears to have pain from lumbar radiculopathy.  He indicates that he feels better after medication. Plan to discharge home  with prescription for steroid dose pak, recommend follow up with PCP as needed.          Final Clinical Impression(s) / ED Diagnoses Final diagnoses:  Low back pain potentially associated with radiculopathy    Rx / DC Orders ED Discharge Orders          Ordered    methylPREDNISolone (MEDROL DOSEPAK) 4 MG TBPK tablet        08/13/23 0644              Darrick Grinder, PA-C 08/13/23 1610    Shon Baton, MD 08/13/23 [phone]

## 2023-08-15 ENCOUNTER — Ambulatory Visit: Payer: Medicaid Other | Admitting: Nurse Practitioner

## 2023-08-15 NOTE — Progress Notes (Deleted)
08/15/2023 Todd Estrada [id number] May 01, 1961   CHIEF COMPLAINT:   HISTORY OF PRESENT ILLNESS: Todd Estrada. 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 Mercie Eon to schedule a screening colonoscopy.   He was admitted to the hospital 07/20/2023 with severe sepsis s/p laparoscopic cholecystectomy 07/05/2023. CT Abdomen/Pelvis showed complex fluid collection in the gallbladder fossa with concern for abscess. Initial WBC 32.4, Lactate 5.0. Patient was tachycardic, hypotensive, Tmax 100.5.  Discharged home on Augmentin 500mg  tid x 7 days.   Recrudescence of seizure activity Patient presented with twitching of left face and left side of the body concerning for breakthrough seizures. Patient has history of seizure disorder, possible recrudescence of seizures in setting of acute illness. Neurology was consulted who recommended continuing patient's home Keppra. No further facial/body twitching was observed      Latest Ref Rng & Units 08/13/2023    5:42 AM 08/03/2023    7:29 PM 07/25/2023   12:30 AM  CBC  WBC 4.0 - 10.5 K/uL 6.2  8.6  7.2   Hemoglobin 13.0 - 17.0 g/dL 40.9  81.1  91.4   Hematocrit 39.0 - 52.0 % 39.2  42.5  32.1   Platelets 150 - 400 K/uL 171  353  196    MCV 95.8     Latest Ref Rng & Units 08/13/2023    5:42 AM 08/03/2023    7:29 PM 07/25/2023   12:30 AM  CMP  Glucose 70 - 99 mg/dL 782  89  956   BUN 8 - 23 mg/dL 24  21  10    Creatinine 0.61 - 1.24 mg/dL 2.13  0.86  5.78   Sodium 135 - 145 mmol/L 141  140  137   Potassium 3.5 - 5.1 mmol/L 3.3  4.6  3.5   Chloride 98 - 111 mmol/L 105  101  100   CO2 22 - 32 mmol/L 28  29  28    Calcium 8.9 - 10.3 mg/dL 9.0  9.6  8.3   Total Protein 6.5 - 8.1 g/dL   6.4    Total Bilirubin 0.3 - 1.2 mg/dL   0.4   Alkaline Phos 38 - 126 U/L   53   AST 15 - 41 U/L   13   ALT 0 - 44 U/L   12     CTAP 07/24/2023:  FINDINGS: Lower chest: Interval development of trace right pleural effusion.   Hepatobiliary: No focal liver abnormality. Status post cholecystectomy. No biliary dilatation.   Pancreas: No focal lesion. Normal pancreatic contour. No surrounding inflammatory changes. No main pancreatic ductal dilatation.   Spleen: Enlarged spleen measuring up to 14 cm.  No focal lesion.   Adrenals/Urinary Tract:   No adrenal nodule bilaterally.   Bilateral kidneys enhance symmetrically. Calcifications associated with the kidneys are vascular. No definite nephroureterolithiasis.   No hydronephrosis. No hydroureter.   The urinary bladder is unremarkable.   Stomach/Bowel: Stomach is within normal limits. No evidence of bowel wall thickening or dilatation. Appendix appears normal.   Vascular/Lymphatic: No abdominal aorta or iliac aneurysm. Severe atherosclerotic plaque of the aorta and its branches. Chronic left common iliac artery occlusion and right common iliac artery severe narrowing due to atherosclerotic plaque. No abdominal, pelvic, or inguinal lymphadenopathy.  Reproductive: Prostate is unremarkable.   Other: Right anterior abdominal percutaneous surgical drain with tip terminating at the cholecystectomy surgical site. Interval slight decrease of trace volume perihepatic, pericolonic along the hepatic flexure, and cholecystectomy surgical site intraperitoneal fluid. Associated fat stranding. Interval improvement in almost complete resolution of the left inguinal region intraperitoneal gas-likely postsurgical (3:77). No organized fluid collection.   Musculoskeletal:   No abdominal wall hernia or abnormality.   No suspicious lytic or blastic osseous lesions. No acute displaced fracture.   IMPRESSION: 1. Interval development of trace right  pleural effusion. 2. Interval slight decrease of trace volume perihepatic, pericolonic along the hepatic flexure, and cholecystectomy surgical site intraperitoneal fluid. Associated fat stranding. No organized fluid collection/biloma/abscess formation. Findings may be postsurgical. Biliary leak is not fully excluded-if concern, consider nuclear medicine HIDA scan. 3. Interval improvement in almost complete resolution of the left inguinal region intraperitoneal gas-likely postsurgical. 4. Aortic Atherosclerosis (ICD10-I70.0)-severe. Chronic left common iliac artery occlusion and right common iliac artery severe narrowing due to atherosclerotic plaque.    ERCP 07/06/2023 by Dr. Vida Rigger:  - The major papilla appeared normal.  - Choledocholithiasis was found. Complete removal was accomplished by biliary sphincterotomy and balloon extraction.  - A biliary sphincterotomy was performed.  - The biliary tree was swept multiple times. No residual stone seen on occlusion cholangiogram at the end of the procedure and no pancreatic duct injection or wire advancement throughout the procedure  EGD 03/11/2022 for PEG placement, patient could not eat secondary to CVA: Normal EGD  Successful PEG placement   Past Medical History:  Diagnosis Date   Asthma    Atypical chest pain 08/13/2022   Community acquired pneumonia 09/14/2022   COPD (chronic obstructive pulmonary disease) (HCC)    Essential hypertension 08/19/2021   GERD (gastroesophageal reflux disease)    HAP (hospital-acquired pneumonia) 09/16/2022   History of tracheostomy    03/09/22-04/11/22   HLD (hyperlipidemia)    Hypertension    Hypokalemia 08/13/2022   Hypomagnesemia 08/13/2022   Lung cancer (HCC)    PAD (peripheral artery disease) (HCC)    Seizures (HCC) 06/02/2022   Sepsis (HCC) 08/13/2022   Sepsis due to pneumonia (HCC) 04/13/2023   Stroke (HCC) 02/2022   Past Surgical History:  Procedure Laterality Date   BRONCHIAL BIOPSY   05/30/2022   Procedure: BRONCHIAL BIOPSIES;  Surgeon: Leslye Peer, MD;  Location: [facility] ENDOSCOPY;  Service: Pulmonary;;   BRONCHIAL BRUSHINGS  05/30/2022   Procedure: BRONCHIAL BRUSHINGS;  Surgeon: Leslye Peer, MD;  Location: [facility] ENDOSCOPY;  Service: Pulmonary;;   BRONCHIAL NEEDLE ASPIRATION BIOPSY  05/30/2022   Procedure: BRONCHIAL NEEDLE ASPIRATION BIOPSIES;  Surgeon: Leslye Peer, MD;  Location: [facility] ENDOSCOPY;  Service: Pulmonary;;   BRONCHIAL WASHINGS  05/30/2022   Procedure: BRONCHIAL WASHINGS;  Surgeon: Leslye Peer, MD;  Location: [facility] ENDOSCOPY;  Service: Pulmonary;;   CHOLECYSTECTOMY N/A 07/05/2023   Procedure: LAPAROSCOPIC CHOLECYSTECTOMY WITH INTRAOPERATIVE CHOLANGIOGRAM;  Surgeon: Karie Soda, MD;  Location: [facility] ORS;  Service: General;  Laterality: N/A;   ERCP N/A 07/06/2023   Procedure: ENDOSCOPIC RETROGRADE CHOLANGIOPANCREATOGRAPHY (ERCP);  Surgeon: Vida Rigger, MD;  Location: [facility] ENDOSCOPY;  Service: Gastroenterology;  Laterality: N/A;   ESOPHAGOGASTRODUODENOSCOPY (EGD) WITH PROPOFOL N/A 03/11/2022   Procedure: ESOPHAGOGASTRODUODENOSCOPY (EGD) WITH PROPOFOL;  Surgeon: Violeta Gelinas, MD;  Location: [facility] ENDOSCOPY;  Service: General;  Laterality: N/A;   FIDUCIAL MARKER PLACEMENT  05/30/2022   Procedure: FIDUCIAL MARKER PLACEMENT;  Surgeon: Leslye Peer, MD;  Location: [facility] ENDOSCOPY;  Service:  Pulmonary;;   IR ANGIO INTRA EXTRACRAN SEL COM CAROTID INNOMINATE UNI R MOD SED  03/02/2022   IR CT HEAD LTD  03/02/2022   IR PERCUTANEOUS ART THROMBECTOMY/INFUSION INTRACRANIAL INC DIAG ANGIO  03/02/2022   PEG PLACEMENT N/A 03/11/2022   Procedure: PERCUTANEOUS ENDOSCOPIC GASTROSTOMY (PEG) PLACEMENT;  Surgeon: Violeta Gelinas, MD;  Location: [facility] ENDOSCOPY;  Service: General;  Laterality: N/A;   RADIOLOGY WITH ANESTHESIA N/A 03/02/2022   Procedure: IR WITH ANESTHESIA;  Surgeon: Julieanne Cotton, MD;  Location: [facility] OR;  Service: Radiology;  Laterality: N/A;   REMOVAL OF STONES  07/06/2023    Procedure: REMOVAL OF STONES;  Surgeon: Vida Rigger, MD;  Location: [facility] ENDOSCOPY;  Service: Gastroenterology;;   Dennison Mascot  07/06/2023   Procedure: Dennison Mascot;  Surgeon: Vida Rigger, MD;  Location: [facility] ENDOSCOPY;  Service: Gastroenterology;;   VIDEO BRONCHOSCOPY WITH RADIAL ENDOBRONCHIAL ULTRASOUND  05/30/2022   Procedure: VIDEO BRONCHOSCOPY WITH RADIAL ENDOBRONCHIAL ULTRASOUND;  Surgeon: Leslye Peer, MD;  Location: [facility] ENDOSCOPY;  Service: Pulmonary;;   Social History:  Family History:    reports that he quit smoking about 15 months ago. His smoking use included cigarettes. He has never been exposed to tobacco smoke. He has never used smokeless tobacco. He reports that he does not currently use alcohol. He reports that he does not use drugs. family history includes Cancer - Lung in his paternal uncle; Kidney failure in his sister; Liver cancer in his father; Throat cancer in his mother.  No Known Allergies    Outpatient Encounter Medications as of 08/15/2023  Medication Sig   albuterol (PROVENTIL) (2.5 MG/3ML) 0.083% nebulizer solution Take 3 mLs (2.5 mg total) by nebulization every 4 (four) hours as needed for wheezing or shortness of breath.   apixaban (ELIQUIS) 5 MG TABS tablet TAKE 1 TABLET(5 MG) BY MOUTH TWICE DAILY (Patient taking differently: Take 5 mg by mouth 2 (two) times daily.)   atorvastatin (LIPITOR) 40 MG tablet Take 1 tablet (40 mg total) by mouth daily.   budesonide (PULMICORT) 0.5 MG/2ML nebulizer solution NEW PRESCRIPTION REQEUST: BUDESONIDE 0.5 MG/ - USE ONE VIAL TWICE DAILY (Patient not taking: Reported on 07/21/2023)   levETIRAcetam (KEPPRA) 500 MG tablet Take 1 tablet (500 mg total) by mouth 2 (two) times daily.   methylPREDNISolone (MEDROL DOSEPAK) 4 MG TBPK tablet Take as directed per package instructions   pantoprazole (PROTONIX) 40 MG tablet Take 1 tablet (40 mg total) by mouth daily.   sucralfate (CARAFATE) 1 GM/10ML suspension Take 10 mLs (1 g total) by  mouth 4 (four) times daily -  with meals and at bedtime. (Patient not taking: Reported on 07/04/2023)   Tiotropium Bromide-Olodaterol (STIOLTO RESPIMAT) 2.5-2.5 MCG/ACT AERS NEW PRESCRIPTION REQUEST: STIOLTO 2.5 MCG- INHALE TWO PUFFS BY MOUTH DAILY (Patient taking differently: Inhale 2 puffs into the lungs daily.)   No facility-administered encounter medications on file as of 08/15/2023.     REVIEW OF SYSTEMS:  Gen: Denies fever, sweats or chills. No weight loss.  CV: Denies chest pain, palpitations or edema. Resp: Denies cough, shortness of breath of hemoptysis.  GI: Denies heartburn, dysphagia, stomach or lower abdominal pain. No diarrhea or constipation.  GU: Denies urinary burning, blood in urine, increased urinary frequency or incontinence. MS: Denies joint pain, muscles aches or weakness. Derm: Denies rash, itchiness, skin lesions or unhealing ulcers. Psych: Denies depression, anxiety, memory loss or confusion. Heme: Denies bruising, easy bleeding. Neuro:  Denies headaches, dizziness or paresthesias. Endo:  Denies any problems with DM, thyroid or adrenal  function.  PHYSICAL EXAM: There were no vitals taken for this visit. General: in no acute distress. Head: Normocephalic and atraumatic. Eyes:  Sclerae non-icteric, conjunctive pink. Ears: Normal auditory acuity. Mouth: Dentition intact. No ulcers or lesions.  Neck: Supple, no lymphadenopathy or thyromegaly.  Lungs: Clear bilaterally to auscultation without wheezes, crackles or rhonchi. Heart: Regular rate and rhythm. No murmur, rub or gallop appreciated.  Abdomen: Soft, nontender, nondistended. No masses. No hepatosplenomegaly. Normoactive bowel sounds x 4 quadrants.  Rectal: Deferred.  Musculoskeletal: Symmetrical with no gross deformities. Skin: Warm and dry. No rash or lesions on visible extremities. Extremities: No edema. Neurological: Alert oriented x 4, no focal deficits.  Psychological:  Alert and cooperative. Normal  mood and affect.  ASSESSMENT AND PLAN:    CC:  Merrilyn Puma, MD

## 2023-08-17 ENCOUNTER — Ambulatory Visit
Admission: RE | Admit: 2023-08-17 | Discharge: 2023-08-17 | Disposition: A | Discharge status: Home / Self Care | Payer: Medicaid Other | Source: Ambulatory Visit | Attending: Radiation Oncology | Admitting: Radiation Oncology

## 2023-08-17 ENCOUNTER — Other Ambulatory Visit (HOSPITAL_COMMUNITY): Payer: Self-pay

## 2023-08-17 ENCOUNTER — Encounter: Payer: Self-pay | Admitting: Radiation Oncology

## 2023-08-17 VITALS — BP 103/65 | HR 99 | Temp 97.7°F | Resp 18 | Ht 72.0 in | Wt 177.0 lb

## 2023-08-17 DIAGNOSIS — Z801 Family history of malignant neoplasm of trachea, bronchus and lung: Secondary | ICD-10-CM | POA: Diagnosis not present

## 2023-08-17 DIAGNOSIS — J449 Chronic obstructive pulmonary disease, unspecified: Secondary | ICD-10-CM | POA: Diagnosis not present

## 2023-08-17 DIAGNOSIS — Z7901 Long term (current) use of anticoagulants: Secondary | ICD-10-CM | POA: Insufficient documentation

## 2023-08-17 DIAGNOSIS — C3411 Malignant neoplasm of upper lobe, right bronchus or lung: Secondary | ICD-10-CM

## 2023-08-17 DIAGNOSIS — M8580 Other specified disorders of bone density and structure, unspecified site: Secondary | ICD-10-CM | POA: Diagnosis not present

## 2023-08-17 DIAGNOSIS — I739 Peripheral vascular disease, unspecified: Secondary | ICD-10-CM | POA: Insufficient documentation

## 2023-08-17 DIAGNOSIS — K219 Gastro-esophageal reflux disease without esophagitis: Secondary | ICD-10-CM | POA: Insufficient documentation

## 2023-08-17 DIAGNOSIS — E785 Hyperlipidemia, unspecified: Secondary | ICD-10-CM | POA: Insufficient documentation

## 2023-08-17 DIAGNOSIS — I10 Essential (primary) hypertension: Secondary | ICD-10-CM | POA: Diagnosis not present

## 2023-08-17 DIAGNOSIS — C3490 Malignant neoplasm of unspecified part of unspecified bronchus or lung: Secondary | ICD-10-CM

## 2023-08-17 DIAGNOSIS — Z7951 Long term (current) use of inhaled steroids: Secondary | ICD-10-CM | POA: Diagnosis not present

## 2023-08-17 DIAGNOSIS — R918 Other nonspecific abnormal finding of lung field: Secondary | ICD-10-CM | POA: Diagnosis present

## 2023-08-17 DIAGNOSIS — I700 Atherosclerosis of aorta: Secondary | ICD-10-CM | POA: Diagnosis not present

## 2023-08-17 DIAGNOSIS — M47816 Spondylosis without myelopathy or radiculopathy, lumbar region: Secondary | ICD-10-CM | POA: Diagnosis not present

## 2023-08-17 DIAGNOSIS — Z87891 Personal history of nicotine dependence: Secondary | ICD-10-CM | POA: Diagnosis not present

## 2023-08-17 DIAGNOSIS — J45909 Unspecified asthma, uncomplicated: Secondary | ICD-10-CM | POA: Diagnosis not present

## 2023-08-17 DIAGNOSIS — M47817 Spondylosis without myelopathy or radiculopathy, lumbosacral region: Secondary | ICD-10-CM | POA: Insufficient documentation

## 2023-08-17 DIAGNOSIS — Z800 Family history of malignant neoplasm of digestive organs: Secondary | ICD-10-CM | POA: Insufficient documentation

## 2023-08-17 DIAGNOSIS — Z923 Personal history of irradiation: Secondary | ICD-10-CM | POA: Diagnosis not present

## 2023-08-17 DIAGNOSIS — C3412 Malignant neoplasm of upper lobe, left bronchus or lung: Secondary | ICD-10-CM | POA: Diagnosis not present

## 2023-08-17 DIAGNOSIS — Z8673 Personal history of transient ischemic attack (TIA), and cerebral infarction without residual deficits: Secondary | ICD-10-CM | POA: Insufficient documentation

## 2023-08-17 DIAGNOSIS — Z79899 Other long term (current) drug therapy: Secondary | ICD-10-CM | POA: Diagnosis not present

## 2023-08-17 NOTE — Progress Notes (Signed)
Nursing interview for Squamous cell lung cancer (HCC). Patient identity verified x2.  Patient reports occasional RT lung/chest pain 5/10 w/ exertion, and fatigue. No other issues conveyed at this time.  Meaningful use complete.  Vitals- BP 103/65 (BP Location: Left Arm, Patient Position: Sitting, Cuff Size: Normal)   Pulse 99   Temp 97.7 F (36.5 C) (Temporal)   Resp 18   Ht 6' (1.829 m)   Wt 177 lb (80.3 kg)   SpO2 98%   BMI 24.01 kg/m   This concludes the interaction.  Ruel Favors, LPN

## 2023-08-19 ENCOUNTER — Emergency Department (HOSPITAL_COMMUNITY)
Admission: EM | Admit: 2023-08-19 | Discharge: 2023-08-19 | Disposition: A | Discharge status: Home / Self Care | Payer: Medicaid Other

## 2023-08-19 ENCOUNTER — Encounter (HOSPITAL_COMMUNITY): Payer: Self-pay

## 2023-08-19 ENCOUNTER — Other Ambulatory Visit: Payer: Self-pay

## 2023-08-19 ENCOUNTER — Emergency Department (HOSPITAL_COMMUNITY): Payer: Medicaid Other

## 2023-08-19 DIAGNOSIS — R4701 Aphasia: Secondary | ICD-10-CM | POA: Insufficient documentation

## 2023-08-19 DIAGNOSIS — M79605 Pain in left leg: Secondary | ICD-10-CM | POA: Diagnosis not present

## 2023-08-19 DIAGNOSIS — M79604 Pain in right leg: Secondary | ICD-10-CM | POA: Diagnosis not present

## 2023-08-19 DIAGNOSIS — Z8673 Personal history of transient ischemic attack (TIA), and cerebral infarction without residual deficits: Secondary | ICD-10-CM | POA: Insufficient documentation

## 2023-08-19 DIAGNOSIS — Z7901 Long term (current) use of anticoagulants: Secondary | ICD-10-CM | POA: Diagnosis not present

## 2023-08-19 LAB — CBC
HCT: 40.1 % (ref 39.0–52.0)
Hemoglobin: 12.6 g/dL — ABNORMAL LOW (ref 13.0–17.0)
MCH: 29.7 pg (ref 26.0–34.0)
MCHC: 31.4 g/dL (ref 30.0–36.0)
MCV: 94.6 fL (ref 80.0–100.0)
Platelets: 144 10*3/uL — ABNORMAL LOW (ref 150–400)
RBC: 4.24 MIL/uL (ref 4.22–5.81)
RDW: 14.6 % (ref 11.5–15.5)
WBC: 5.7 10*3/uL (ref 4.0–10.5)
nRBC: 0 % (ref 0.0–0.2)

## 2023-08-19 LAB — TROPONIN I (HIGH SENSITIVITY): Troponin I (High Sensitivity): 6 ng/L (ref <18)

## 2023-08-19 LAB — BASIC METABOLIC PANEL
Anion gap: 8 (ref 5–15)
BUN: 11 mg/dL (ref 8–23)
CO2: 30 mmol/L (ref 22–32)
Calcium: 8.8 mg/dL — ABNORMAL LOW (ref 8.9–10.3)
Chloride: 101 mmol/L (ref 98–111)
Creatinine, Ser: 0.67 mg/dL (ref 0.61–1.24)
GFR, Estimated: >60 mL/min (ref >60)
Glucose, Bld: 106 mg/dL — ABNORMAL HIGH (ref 70–99)
Potassium: 3.6 mmol/L (ref 3.5–5.1)
Sodium: 139 mmol/L (ref 135–145)

## 2023-08-19 LAB — BRAIN NATRIURETIC PEPTIDE: B Natriuretic Peptide: 68.1 pg/mL (ref 0.0–100.0)

## 2023-08-19 LAB — CBG MONITORING, ED: Glucose-Capillary: 106 mg/dL — ABNORMAL HIGH (ref 70–99)

## 2023-08-19 MED ORDER — ASPIRIN 81 MG PO CHEW
324.0000 mg | CHEWABLE_TABLET | Freq: Once | ORAL | Status: AC
  Administered 2023-08-19: 324 mg via ORAL
  Filled 2023-08-19: qty 4

## 2023-08-19 NOTE — ED Notes (Signed)
RN called cousin who said "right leg hurting". Kept pointing to his knee and says chest was hurting- Stage 4 lung cancer. He wants leg checked for blood clots. He takes eliquis BID. Cousin does make medical decisions and will have phone available for calls if needed. RN explained that communication is difficult. He agreed and they also have difficulty and try to do the best they can.

## 2023-08-19 NOTE — ED Provider Notes (Signed)
[facility] EMERGENCY DEPARTMENT AT [facility] Provider Note   CSN: [id number] Arrival date & time: 08/19/23  1718     History  Chief Complaint  Patient presents with   Leg Pain    Todd Estrada is a 62 y.o. male.  The history is provided by the EMS personnel.  Leg Pain 62 year old male with past medical history of aphasia from CVA as well as pulmonary embolism on Eliquis presenting to the emergency department today with apparent complaint of pain in his leg.  The patient is aphasic and is difficult to communicate with him.  I see the patient he is mainly pointing to his chest when asked if he is having pain.  Unfortunately due to his aphasia I am unable to get much more history from the patient here today.     Home Medications Prior to Admission medications   Medication Sig Start Date End Date Taking? Authorizing Provider  apixaban (ELIQUIS) 5 MG TABS tablet TAKE 1 TABLET(5 MG) BY MOUTH TWICE DAILY Patient taking differently: Take 5 mg by mouth 2 (two) times daily. 04/11/23  Yes Merrilyn Puma, MD  atorvastatin (LIPITOR) 40 MG tablet Take 1 tablet (40 mg total) by mouth daily. 01/03/23 01/03/24 Yes Merrilyn Puma, MD  levETIRAcetam (KEPPRA) 500 MG tablet Take 1 tablet (500 mg total) by mouth 2 (two) times daily. 12/07/22  Yes Ihor Austin, NP  Multiple Vitamin (MULTIVITAMIN WITH MINERALS) TABS tablet Take 1 tablet by mouth daily.   Yes [provider]  pantoprazole (PROTONIX) 40 MG tablet Take 40 mg by mouth daily.   Yes [provider]  Tiotropium Bromide-Olodaterol (STIOLTO RESPIMAT) 2.5-2.5 MCG/ACT AERS NEW PRESCRIPTION REQUEST: STIOLTO 2.5 MCG- INHALE TWO PUFFS BY MOUTH DAILY Patient taking differently: Inhale 2 puffs into the lungs daily. 11/29/22  Yes Martina Sinner, MD  albuterol (PROVENTIL) (2.5 MG/3ML) 0.083% nebulizer solution Take 3 mLs (2.5 mg total) by nebulization every 4 (four) hours as needed for wheezing or shortness of  breath. Patient not taking: Reported on 08/19/2023 08/16/22   Steffanie Rainwater, MD  budesonide (PULMICORT) 0.5 MG/2ML nebulizer solution NEW PRESCRIPTION REQEUST: BUDESONIDE 0.5 MG/ - USE ONE VIAL TWICE DAILY Patient not taking: Reported on 08/19/2023 11/29/22   Martina Sinner, MD  methylPREDNISolone (MEDROL DOSEPAK) 4 MG TBPK tablet Take as directed per package instructions Patient not taking: Reported on 08/19/2023 08/13/23   Darrick Grinder, PA-C  pantoprazole (PROTONIX) 40 MG tablet Take 1 tablet (40 mg total) by mouth daily. 07/10/23 08/09/23  Pahwani, Kasandra Knudsen, MD  sucralfate (CARAFATE) 1 GM/10ML suspension Take 10 mLs (1 g total) by mouth 4 (four) times daily -  with meals and at bedtime. Patient not taking: Reported on 07/04/2023 07/02/23   Nira Conn, MD      Allergies    Patient has no known allergies.    Review of Systems   Review of Systems ROS unobtainable due to severe aphasia Physical Exam Updated Vital Signs BP (!) 149/84   Pulse 73   Temp 98.4 F (36.9 C) (Oral)   Resp 18   Ht 6' (1.829 m)   SpO2 100%   BMI 24.01 kg/m  Physical Exam Gen: NAD Eyes: PERRL, EOMI HEENT: no oropharyngeal swelling Neck: trachea midline Resp: clear to auscultation bilaterally Card: RRR, no murmurs, rubs, or gallops Abd: nontender, nondistended Extremities: No appreciate any significant Swelling or edema, I do not see any significant overlying erythema Vascular: 2+ radial pulses bilaterally, 2+ DP pulses  bilaterally Neuro: Right-sided hemiparesis noted, aphasic Skin: no rashes Psyc: acting appropriately  ED Results / Procedures / Treatments   Labs (all labs ordered are listed, but only abnormal results are displayed) Labs Reviewed  BASIC METABOLIC PANEL - Abnormal; Notable for the following components:      Result Value   Glucose, Bld 106 (*)    Calcium 8.8 (*)    All other components within normal limits  CBC - Abnormal; Notable for the following components:    Hemoglobin 12.6 (*)    Platelets 144 (*)    All other components within normal limits  CBG MONITORING, ED - Abnormal; Notable for the following components:   Glucose-Capillary 106 (*)    All other components within normal limits  BRAIN NATRIURETIC PEPTIDE  TROPONIN I (HIGH SENSITIVITY)  TROPONIN I (HIGH SENSITIVITY)    EKG EKG Interpretation Date/Time:  Saturday August 19 2023 20:39:17 EDT Ventricular Rate:  67 PR Interval:  43 QRS Duration:  96 QT Interval:  377 QTC Calculation: 398 R Axis:   50  Text Interpretation: Sinus rhythm Short PR interval Probable left atrial enlargement Confirmed by Beckey Downing [phone]) on 08/19/2023 10:07:28 PM  Radiology DG Chest Portable 1 View  Result Date: 08/19/2023 CLINICAL DATA:  Pt brought in by family. Pt points to the right leg and rubs his thigh for pain. Pt with previous hx of CVA with residual aphasia and unable to communicate. EXAM: PORTABLE CHEST 1 VIEW COMPARISON:  PET/CT 08/11/2023 FINDINGS: No change since PET/CT 08/11/2023. Bilateral bandlike areas of scarring within the mid lungs. No new area of consolidation. No pleural effusion or pneumothorax. Normal cardiomediastinal silhouette. Aortic atherosclerotic calcification. No displaced rib fractures. IMPRESSION: No acute cardiopulmonary process. Bandlike areas of fibrosis in the mid lungs bilaterally similar to recent PET/CT. Electronically Signed   By: Minerva Fester M.D.   On: 08/19/2023 22:13    Procedures Procedures    Medications Ordered in ED Medications  aspirin chewable tablet 324 mg (324 mg Oral Given 08/19/23 2138)    ED Course/ Medical Decision Making/ A&P                                 Medical Decision Making 62 year old male with past medical history of CVA with aphasia presenting to the emergency department today with concern for possible chest pain and leg pain.  I will further evaluate the patient here with a cardiac workup.  Also obtain a troponin and BNP to  further evaluate for potential pulmonary embolism that would necessitate changing his anticoagulation.  The patient does seem well-perfused and suspicion for arterial occlusion is low at this time.  His vital signs are otherwise unremarkable.  Think that if his workup here today is reassuring that he may be safely discharged and is already on Eliquis at this time.  The patient's EKG interpreted by me shows a sinus rhythm with normal axis, normal intervals, no significant ST-T changes.  The patient's work appears reassuring.  Chest x-ray is unremarkable.  The patient has a negative troponin and BNP suspicion for significant pulmonary embolism is low especially with the patient being on Eliquis already.  The patient does have a reassuring exam here and does have palpable pulses in does seem to be neurovascularly intact with exception of the right sided hemiparesis that he has at baseline.  Ankle is stable for discharge.  I will place an order for an ultrasound to  be performed as an outpatient but he is on Eliquis and has no signs of DVT here.  We do not have capability to provide the study this evening.  He will be discharged with return precautions.    Amount and/or Complexity of Data Reviewed Labs: ordered. Radiology: ordered.  Risk OTC drugs.           Final Clinical Impression(s) / ED Diagnoses Final diagnoses:  Bilateral leg pain  Disposition: discharge  Rx / DC Orders ED Discharge Orders          Ordered    [facility]        08/19/23 2253              Durwin Glaze, MD 08/19/23 2307

## 2023-08-19 NOTE — ED Triage Notes (Addendum)
Pt brought in by family. According to EMT first when pt was brought into the ER family stated "he's here for leg pain, I'm not staying, call me when he's ready to be picked up." When asking pt why he is here he points to the right leg and rubs his thigh. Pt with previous hx of CVA with residual aphasia and unable to communicate.

## 2023-08-19 NOTE — Discharge Instructions (Signed)
Your workup today was reassuring.  I did place an order for an ultrasound of your legs to be performed tomorrow.  Please call or go to [facility] at 11 AM to have this performed.  Please return to the emergency department for worsening symptoms.  Your heart workup was unremarkable and your labs were reassuring today.  Take Tylenol as needed for pain.

## 2023-08-19 NOTE — ED Notes (Signed)
Pt cousin Fayrene Fearing contacted via phone at this time, informed of pt for d/c to home at this time. D/C information discussed over the phone including appointment tomorrow at [facility] for [place] of BLE. Informed he will be here to get patient and to assist him to wheelchair then to lobby for him to pick up approx. 2330.

## 2023-08-19 NOTE — Progress Notes (Signed)
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   HISTORY OF PRESENT ILLNESS: Todd Estrada is a 62 y.o. male seen at the request of Dr. Arbutus Ped for a history of Synchronous Stage IIB, cT2aN1M0, NSCLC favor squamous cell carcinoma of the LUL and Stage IA2, cT1bN0M0, NSCLC, favor squamous cell carcinoma of the RUL  He is known to our clinic from prior ER treatment to the right upper lobe and chemoradiation for the left upper lobe.  He has been followed in surveillance but recent scans while he had been hospitalized showed emphysematous changes of the lungs as well as masslike airspace opacity in the peripheral right upper lobe while it was stable, radiology felt that there could be concerns for persistent or infectious features.  He underwent a PET scan on 08/11/2023 which showed bandlike fibrosis and architectural distortion in the left perihilar lung reflecting posttreatment changes and a 2.1 cm area within the left upper lobe that also appeared treated with an SUV of 3.5 decreased in size from prior scans in June 2024, but within the periphery of the right mid lung there was bandlike fibrosis and masslike architectural distortion in the peripheral right upper lobe with an SUV of 4.49.  No other evidence of metastatic changes were appreciated.  There was activity within the gallbladder fossa felt to be postoperative in nature with residual inflammatory changes the patient had had a recent cholecystectomy and  complications of abscess.  He is seen today to discuss additional options of radiotherapy.    PREVIOUS RADIATION THERAPY: Yes    07/04/2022 through 09/01/2022 SBRT Treatment Site Technique Total Dose (Gy) Dose per Fx (Gy) Completed Fx Beam Energies  Lung, Right: Lung_R_RUL IMRT 54/54 18 3/3 6XFFF    07/04/2022 through 08/18/2022 Site Technique Total Dose (Gy) Dose per Fx (Gy) Completed Fx Beam Energies  Lung, Left: Lung_L_LUL 3D 58.96/60 2 30/30 6X  Lung, Left: Lung_L_Bst_LUL 3D 6/6 2 3/3 6X    PAST MEDICAL HISTORY:  Past Medical History:  Diagnosis Date   Asthma    Atypical chest pain 08/13/2022   Community acquired pneumonia 09/14/2022   COPD (chronic obstructive pulmonary disease) (HCC)    Essential hypertension 08/19/2021   GERD (gastroesophageal reflux disease)    HAP (hospital-acquired pneumonia) 09/16/2022   History of tracheostomy    03/09/22-04/11/22   HLD (hyperlipidemia)    Hypertension    Hypokalemia 08/13/2022   Hypomagnesemia 08/13/2022   Lung cancer (HCC)    PAD (peripheral artery disease) (HCC)    Seizures (HCC) 06/02/2022   Sepsis (HCC) 08/13/2022   Sepsis due to pneumonia (HCC) 04/13/2023   Stroke (HCC) 02/2022       PAST SURGICAL HISTORY: Past Surgical History:  Procedure Laterality Date   BRONCHIAL BIOPSY  05/30/2022   Procedure: BRONCHIAL BIOPSIES;  Surgeon: Leslye Peer, MD;  Location: [facility] ENDOSCOPY;  Service: Pulmonary;;   BRONCHIAL BRUSHINGS  05/30/2022   Procedure: BRONCHIAL BRUSHINGS;  Surgeon: Leslye Peer, MD;  Location: [facility] ENDOSCOPY;  Service: Pulmonary;;   BRONCHIAL NEEDLE ASPIRATION BIOPSY  05/30/2022   Procedure: BRONCHIAL NEEDLE ASPIRATION BIOPSIES;  Surgeon: Leslye Peer, MD;  Location: [facility] ENDOSCOPY;  Service: Pulmonary;;   BRONCHIAL WASHINGS  05/30/2022   Procedure: BRONCHIAL WASHINGS;  Surgeon: Leslye Peer, MD;  Location: [facility] ENDOSCOPY;  Service: Pulmonary;;   CHOLECYSTECTOMY N/A 07/05/2023   Procedure: LAPAROSCOPIC  CHOLECYSTECTOMY WITH INTRAOPERATIVE CHOLANGIOGRAM;  Surgeon: Karie Soda, MD;  Location: [facility] ORS;  Service: General;  Laterality: N/A;   ERCP N/A 07/06/2023   Procedure: ENDOSCOPIC RETROGRADE CHOLANGIOPANCREATOGRAPHY (ERCP);  Surgeon: Vida Rigger, MD;  Location: [facility] ENDOSCOPY;  Service: Gastroenterology;  Laterality: N/A;   ESOPHAGOGASTRODUODENOSCOPY (EGD) WITH PROPOFOL N/A 03/11/2022   Procedure: ESOPHAGOGASTRODUODENOSCOPY (EGD) WITH PROPOFOL;  Surgeon: Violeta Gelinas, MD;  Location: [facility] ENDOSCOPY;  Service: General;  Laterality: N/A;   FIDUCIAL MARKER PLACEMENT  05/30/2022   Procedure: FIDUCIAL MARKER PLACEMENT;  Surgeon: Leslye Peer, MD;  Location: [facility] ENDOSCOPY;  Service: Pulmonary;;   IR ANGIO INTRA EXTRACRAN SEL COM CAROTID INNOMINATE UNI R MOD SED  03/02/2022   IR CT HEAD LTD  03/02/2022   IR PERCUTANEOUS ART THROMBECTOMY/INFUSION INTRACRANIAL INC DIAG ANGIO  03/02/2022   PEG PLACEMENT N/A 03/11/2022   Procedure: PERCUTANEOUS ENDOSCOPIC GASTROSTOMY (PEG) PLACEMENT;  Surgeon: Violeta Gelinas, MD;  Location: [facility] ENDOSCOPY;  Service: General;  Laterality: N/A;   RADIOLOGY WITH ANESTHESIA N/A 03/02/2022   Procedure: IR WITH ANESTHESIA;  Surgeon: Julieanne Cotton, MD;  Location: [facility] OR;  Service: Radiology;  Laterality: N/A;   REMOVAL OF STONES  07/06/2023   Procedure: REMOVAL OF STONES;  Surgeon: Vida Rigger, MD;  Location: [facility] ENDOSCOPY;  Service: Gastroenterology;;   Dennison Mascot  07/06/2023   Procedure: Dennison Mascot;  Surgeon: Vida Rigger, MD;  Location: [facility] ENDOSCOPY;  Service: Gastroenterology;;   VIDEO BRONCHOSCOPY WITH RADIAL ENDOBRONCHIAL ULTRASOUND  05/30/2022   Procedure: VIDEO BRONCHOSCOPY WITH RADIAL ENDOBRONCHIAL ULTRASOUND;  Surgeon: Leslye Peer, MD;  Location: [facility] ENDOSCOPY;  Service: Pulmonary;;     FAMILY HISTORY:  Family History  Problem Relation Age of Onset   Throat cancer Mother    Liver cancer Father    Kidney failure Sister    Cancer - Lung Paternal Uncle       SOCIAL HISTORY:  reports that he quit smoking about 15 months ago. His smoking use included cigarettes. He has never been exposed to tobacco smoke. He has never used smokeless tobacco. He reports that he does not currently use alcohol. He reports that he does not use drugs.  The patient lives with his nephew who is his closest family member and Social research officer, government.  The patient has had significant physical limitations due to prior stroke and while he is a part of our discussions his deficits in speech make it difficult to interpret his input in his care.  It is clear though that he is intentional in his family understands what he is trying to convey.   ALLERGIES: Patient has no known allergies.   MEDICATIONS:  Current Outpatient Medications  Medication Sig Dispense Refill   albuterol (PROVENTIL) (2.5 MG/3ML) 0.083% nebulizer solution Take 3 mLs (2.5 mg total) by nebulization every 4 (four) hours as needed for wheezing or shortness of breath. 90 mL 12   apixaban (ELIQUIS) 5 MG TABS tablet TAKE 1 TABLET(5 MG) BY MOUTH TWICE DAILY (Patient taking differently: Take 5 mg by mouth 2 (two) times daily.) 180 tablet 2   atorvastatin (LIPITOR) 40 MG tablet Take 1 tablet (40 mg total) by mouth daily.  90 tablet 3   budesonide (PULMICORT) 0.5 MG/2ML nebulizer solution NEW PRESCRIPTION REQEUST: BUDESONIDE 0.5 MG/ - USE ONE VIAL TWICE DAILY (Patient not taking: Reported on 07/21/2023) 180 mL 3   levETIRAcetam (KEPPRA) 500 MG tablet Take 1 tablet (500 mg total) by mouth 2 (two) times daily. 180 tablet 3   methylPREDNISolone (MEDROL DOSEPAK) 4 MG TBPK tablet Take as directed per package instructions 21 tablet 0   pantoprazole (PROTONIX) 40 MG tablet Take 1 tablet (40 mg total) by mouth daily. 30 tablet 0   sucralfate (CARAFATE) 1 GM/10ML suspension Take 10 mLs (1 g total) by mouth 4 (four) times daily -  with meals and at bedtime. (Patient not taking: Reported on 07/04/2023) 420 mL 0   Tiotropium  Bromide-Olodaterol (STIOLTO RESPIMAT) 2.5-2.5 MCG/ACT AERS NEW PRESCRIPTION REQUEST: STIOLTO 2.5 MCG- INHALE TWO PUFFS BY MOUTH DAILY (Patient taking differently: Inhale 2 puffs into the lungs daily.) 12 g 3   No current facility-administered medications for this encounter.     REVIEW OF SYSTEMS: On review of systems, the patient's symptoms are concerning for pain along the right chest wall.  This is noted somewhat more anteriorly at times but seems to radiate from the lateral sidewall at the level of where he had prior SBRT radiation to the anterior chest.  He finds that this is more uncomfortable sometimes at night or when he is trying to sleep.  No other sites of pain are noted.  His family states that he has been using some pain patches that they purchased over-the-counter.  No other complaints are verbalized.  PHYSICAL EXAM:  Wt Readings from Last 3 Encounters:  08/17/23 177 lb (80.3 kg)  08/03/23 175 lb (79.4 kg)  07/25/23 180 lb 5.4 oz (81.8 kg)   Temp Readings from Last 3 Encounters:  08/17/23 97.7 F (36.5 C) (Temporal)  08/13/23 98.3 F (36.8 C) (Oral)  08/03/23 98.9 F (37.2 C) (Oral)   BP Readings from Last 3 Encounters:  08/17/23 103/65  08/13/23 (!) 133/97  08/03/23 137/86   Pulse Readings from Last 3 Encounters:  08/17/23 99  08/13/23 87  08/03/23 65   Pain Assessment Pain Score: 5  Pain Loc: Chest (RT lung discomfort)/10  In general this is a chronically ill appearing male who appears older than his stated age.  He has poor physical hygiene but is in no acute distress.  He's alert and oriented x4 and appropriate throughout the examination though his prior deficits from his stroke limit his ability to be understood.  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     ECOG = 2  0 - Asymptomatic (Fully  active, able to carry on all predisease activities without restriction)  1 - Symptomatic but completely ambulatory (Restricted in physically strenuous activity but ambulatory and able to carry out work of a light or sedentary nature. For example, light housework, office work)  2 - Symptomatic, <50% in bed during the day (Ambulatory and capable of all self care but unable to carry out any work activities. Up and about more than 50% of waking hours)  3 - Symptomatic, >50% in bed, but not bedbound (Capable of only limited self-care, confined to bed or chair 50% or more of waking hours)  4 - Bedbound (Completely disabled. Cannot carry on any self-care. Totally confined to bed or  chair)  5 - Death   Santiago Glad MM, Creech RH, Tormey DC, et al. [phone]). "Toxicity and response criteria of the [facility]". Am. Evlyn Clines. Oncol. 5 (6): 649-55    LABORATORY DATA:  Lab Results  Component Value Date   WBC 6.2 08/13/2023   HGB 11.6 (L) 08/13/2023   HCT 39.2 08/13/2023   MCV 95.8 08/13/2023   PLT 171 08/13/2023   Lab Results  Component Value Date   NA 141 08/13/2023   K 3.3 (L) 08/13/2023   CL 105 08/13/2023   CO2 28 08/13/2023   Lab Results  Component Value Date   ALT 12 07/25/2023   AST 13 (L) 07/25/2023   ALKPHOS 53 07/25/2023   BILITOT 0.4 07/25/2023      RADIOGRAPHY: NM PET Image Restag (PS) Skull Base To Thigh  Result Date: 08/17/2023 CLINICAL DATA:  Subsequent treatment strategy for non-small cell lung cancer. EXAM: NUCLEAR MEDICINE PET SKULL BASE TO THIGH TECHNIQUE: 8.7 mCi F-18 FDG was injected intravenously. Full-ring PET imaging was performed from the skull base to thigh after the radiotracer. CT data was obtained and used for attenuation correction and anatomic localization. Fasting blood glucose: 105 mg/dl COMPARISON:  CT AP from 07/24/2023. CT angio chest from 07/12/2023 and remote PET-CT from 05/12/2022 FINDINGS: Mediastinal blood pool activity: SUV max 2.44  Liver activity: SUV max NA NECK: No hypermetabolic lymph nodes in the neck. Incidental CT findings: None. CHEST: There is no tracer avid supraclavicular, axillary, mediastinal, or hilar lymph nodes. Geographic, bandlike area of fibrosis and architectural distortion within the perihilar left mid lung is identified reflecting changes secondary to external beam radiation. Corresponding increased uptake within these areas reflecting post radiation inflammation. The treated lung nodule within the perihilar, medial left upper lung measures 2.1 x 1.3 cm with SUV max 3.50, image 29/7. On 06/03/2023 this area measured 3.1 x 1.7 cm. Within the periphery of the right midlung there is a bandlike area of fibrosis and masslike architectural distortion in the distribution of previously noted peripheral right upper lobe lung lesion (as indicated by underlying fiducial marker). The treated tumor is indistinguishable from the changes of external beam radiation and no longer confidently identified separate from these areas. Increased uptake within the area of radiation change has an SUV max of 4.49. Similar appearance of scattered small peripheral nodules within the right lower lung which are technically too small to reliably characterize by PET-CT but appears stable from the previous imaging. Incidental CT findings: Emphysema. Aortic atherosclerosis and coronary artery calcifications. ABDOMEN/PELVIS: There is a rim of increased within the region of the gallbladder fossa which has an SUV max of 7.22, fused PET-CT image 115. This is favored to represent inflammatory change in this patient who has a recent history of cholecystectomy complicated by abscess.There is no abnormal areas of increased uptake specific for liver metastasis. No abnormal uptake within the pancreas, spleen or adrenal glands. No tracer avid abdominopelvic lymph nodes. Incidental CT findings: Aortic atherosclerosis. Status post cholecystectomy. SKELETON: No focal  hypermetabolic activity to suggest skeletal metastasis. Incidental CT findings: None. IMPRESSION: 1. There is a bandlike area of fibrosis and architectural distortion within the perihilar left mid lung reflecting changes secondary to external beam radiation. Corresponding increased uptake within these areas reflecting post radiation inflammation. 2. The treated left upper lobe lung lesion measures 2.1 x 1.3 cm with SUV max 3.50. This is decreased in size when compared with the CT from 06/03/2023. 3. Within the periphery of the right  midlung there is a bandlike area of fibrosis and masslike architectural distortion in the distribution of previously noted peripheral right upper lobe lung lesion (as indicated by underlying fiducial marker). The treated tumor is indistinguishable from the changes of external beam radiation and no longer confidently identified separate from these areas. Increased uptake within the area of radiation change has an SUV max of 4.49. 4. No signs of tracer avid nodal metastasis or distant metastatic disease. 5. There is a rim of increased uptake within the region of the gallbladder fossa which has an SUV max of 7.22. This is favored to represent residual inflammatory change in this patient who has a recent history of cholecystectomy complicated by abscess. 6. Aortic Atherosclerosis (ICD10-I70.0) and Emphysema (ICD10-J43.9). Electronically Signed   By: Signa Kell M.D.   On: 08/17/2023 08:12   CT Lumbar Spine Wo Contrast  Result Date: 08/13/2023 CLINICAL DATA:  Radiculopathy.  Right right leg pain EXAM: CT LUMBAR SPINE WITHOUT CONTRAST TECHNIQUE: Multidetector CT imaging of the lumbar spine was performed without intravenous contrast administration. Multiplanar CT image reconstructions were also generated. RADIATION DOSE REDUCTION: This exam was performed according to the departmental dose-optimization program which includes automated exposure control, adjustment of the mA and/or kV  according to patient size and/or use of iterative reconstruction technique. COMPARISON:  Abdominal CT 07/24/2023 FINDINGS: Segmentation: 5 lumbar type vertebrae. Alignment: Normal. Vertebrae: No acute fracture or focal pathologic process. Paraspinal and other soft tissues: Diffuse atheromatous calcification. Disc levels: Degenerative facet spurring especially at L4-5 and L5-S1. 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 Bulging disc crowds the foramina at L3-4 and below but no high-grade foraminal stenosis. Moderate L4-5 spinal canal narrowing. IMPRESSION: 1. No acute finding or foraminal impingement. 2. Lumbar spine degeneration causes moderate spinal canal narrowing at L4-5. Electronically Signed   By: Tiburcio Pea M.D.   On: 08/13/2023 06:28   DG Chest Port 1 View  Result Date: 08/03/2023 CLINICAL DATA:  Shortness of breath, history of squamous cell lung cancer EXAM: PORTABLE CHEST 1 VIEW COMPARISON:  07/20/2023 FINDINGS: When account is made for reverse lordotic projection, the left hilar density and right peripheral lung density are stable compared 07/20/2023. Small fiducial in the right peripheral lung density. These likely reflect regions of treated malignancy. Hazy density at the left lung base on 07/20/2023 has resolved. Cardiac and mediastinal margins appear normal. IMPRESSION: 1. Stable left hilar and right peripheral lung densities, likely reflecting treated malignancy. 2. Resolution of hazy density at the left lung base. 3. No new pulmonary opacity is identified. Electronically Signed   By: Gaylyn Rong M.D.   On: 08/03/2023 20:23   CT ABDOMEN PELVIS W CONTRAST  Result Date: 07/24/2023 CLINICAL DATA:  Intra-abdominal abscess post surgical infection, abscess of gallbladder fossa EXAM: CT ABDOMEN AND PELVIS WITH CONTRAST TECHNIQUE: Multidetector CT imaging of the abdomen and pelvis was performed using the standard protocol following bolus administration of intravenous  contrast. RADIATION DOSE REDUCTION: This exam was performed according to the departmental dose-optimization program which includes automated exposure control, adjustment of the mA and/or kV according to patient size and/or use of iterative reconstruction technique. CONTRAST:  75mL OMNIPAQUE IOHEXOL 350 MG/ML SOLN COMPARISON:  CT abdomen pelvis 07/20/2023 FINDINGS: Lower chest: Interval development of trace right pleural effusion. Hepatobiliary: No focal liver abnormality. Status post cholecystectomy. No biliary dilatation. Pancreas: No focal lesion. Normal pancreatic contour. No surrounding inflammatory changes. No main pancreatic ductal dilatation. Spleen: Enlarged spleen measuring up to 14 cm.  No focal  lesion. Adrenals/Urinary Tract: No adrenal nodule bilaterally. Bilateral kidneys enhance symmetrically. Calcifications associated with the kidneys are vascular. No definite nephroureterolithiasis. No hydronephrosis. No hydroureter. The urinary bladder is unremarkable. Stomach/Bowel: Stomach is within normal limits. No evidence of bowel wall thickening or dilatation. Appendix appears normal. Vascular/Lymphatic: No abdominal aorta or iliac aneurysm. Severe atherosclerotic plaque of the aorta and its branches. Chronic left common iliac artery occlusion and right common iliac artery severe narrowing due to atherosclerotic plaque. No abdominal, pelvic, or inguinal lymphadenopathy. Reproductive: Prostate is unremarkable. Other: Right anterior abdominal percutaneous surgical drain with tip terminating at the cholecystectomy surgical site. Interval slight decrease of trace volume perihepatic, pericolonic along the hepatic flexure, and cholecystectomy surgical site intraperitoneal fluid. Associated fat stranding. Interval improvement in almost complete resolution of the left inguinal region intraperitoneal gas-likely postsurgical (3:77). No organized fluid collection. Musculoskeletal: No abdominal wall hernia or  abnormality. No suspicious lytic or blastic osseous lesions. No acute displaced fracture. IMPRESSION: 1. Interval development of trace right pleural effusion. 2. Interval slight decrease of trace volume perihepatic, pericolonic along the hepatic flexure, and cholecystectomy surgical site intraperitoneal fluid. Associated fat stranding. No organized fluid collection/biloma/abscess formation. Findings may be postsurgical. Biliary leak is not fully excluded-if concern, consider nuclear medicine HIDA scan. 3. Interval improvement in almost complete resolution of the left inguinal region intraperitoneal gas-likely postsurgical. 4. Aortic Atherosclerosis (ICD10-I70.0)-severe. Chronic left common iliac artery occlusion and right common iliac artery severe narrowing due to atherosclerotic plaque. Electronically Signed   By: Tish Frederickson M.D.   On: 07/24/2023 16:19   EEG adult  Result Date: 07/21/2023 Windell Norfolk, MD     07/21/2023  4:55 PM History: 62 year old male with PMHx of Left MCA stroke, HLD, seizure, who is presenting with weakness and fever. EEG to rule out seizures EEG classification: Awake and drowsy Duration: 25 minutes Technical aspects: This EEG study was done with scalp electrodes positioned according to the 10-20 International system of electrode placement. Electrical activity was reviewed with band pass filter of 1-70Hz , sensitivity of 7 uV/mm, display speed of 31mm/sec with a 60Hz  notched filter applied as appropriate. EEG data were recorded continuously and digitally stored. Description of the recording: The background rhythms of this recording consists of a fairly well modulated medium amplitude theta activity. Present in the anterior head region is a 15-20 Hz beta activity. Photic stimulation was performed, did not show any abnormalities. Hyperventilation was not performed. No abnormal epileptiform discharges seen during this recording. There was diffuse slowing and left hemispheric focal  slowing. There were no electrographic seizure identified. Abnormality: Left hemispheric slowing Mild diffuse slowing Impression: This is an abnormal EEG recorded while drowsy and awake due to presence of left hemispheric slowing which is consistent with an area of neuronal dysfunction in the left hemisphere and mild diffuse slowing which is consistent with a generalized brain dysfunction nonspecific, such as encephalopathy. Windell Norfolk, MD [facility]   CT HEAD WO CONTRAST ( )  Result Date: 07/21/2023 CLINICAL DATA:  Seizure, focal (Ped 0-17y) EXAM: CT HEAD WITHOUT CONTRAST TECHNIQUE: Contiguous axial images were obtained from the base of the skull through the vertex without intravenous contrast. RADIATION DOSE REDUCTION: This exam was performed according to the departmental dose-optimization program which includes automated exposure control, adjustment of the mA and/or kV according to patient size and/or use of iterative reconstruction technique. COMPARISON:  CT head 07/04/2023 FINDINGS: Brain: Patchy and confluent areas of decreased attenuation are noted throughout the deep and periventricular white matter of  the cerebral hemispheres bilaterally, compatible with chronic microvascular ischemic disease. Similar-appearing chronic left MCA territory and left cerebellar infarctions. No evidence of large-territorial acute infarction. No parenchymal hemorrhage. No mass lesion. No extra-axial collection. No mass effect or midline shift. No hydrocephalus. Basilar cisterns are patent. Vascular: No hyperdense vessel. Skull: No acute fracture or focal lesion. Sinuses/Orbits: Almost complete opacification of the right maxillary sinus fluid CT of the sinuses of chronic infection. Otherwise paranasal sinuses and mastoid air cells are clear. The orbits are unremarkable. Other: None. IMPRESSION: No acute intracranial abnormality in a patient with similar-appearing chronic left MCA territory and left  cerebellar infarctions. If high clinical suspicion for superimposed acute infarction, please consider MRI brain noncontrast for further evaluation. Electronically Signed   By: Tish Frederickson M.D.   On: 07/21/2023 03:41   CT ABDOMEN PELVIS W CONTRAST  Result Date: 07/20/2023 CLINICAL DATA:  Status post laparoscopic cholecystectomy for 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 EXAM: CT ABDOMEN AND PELVIS WITH CONTRAST TECHNIQUE: Multidetector CT imaging of the abdomen and pelvis was performed using the standard protocol following bolus administration of intravenous contrast. RADIATION DOSE REDUCTION: This exam was performed according to the departmental dose-optimization program which includes automated exposure control, adjustment of the mA and/or kV according to patient size and/or use of iterative reconstruction technique. CONTRAST:  80mL OMNIPAQUE IOHEXOL 300 MG/ML  SOLN COMPARISON:  07/12/2023 FINDINGS: Lower chest: Trace atelectasis or scarring noted dependent left lung base. Hepatobiliary: No suspicious focal abnormality within the liver parenchyma. 4.0 x 3.4 x 2.5 cm complex fluid collection is identified in the gallbladder fossa containing a small gas bubble (axial 33/2 and coronal 100/7). The tip of a surgical drain is positioned in the posterior gallbladder fossa adjacent to the surgical clips coursing along the posteroinferior liver margin and out the right anterior abdominal wall. Pancreas: No focal mass lesion. No dilatation of the main duct. No intraparenchymal cyst. No peripancreatic edema. Spleen: No splenomegaly. No suspicious focal mass lesion. Adrenals/Urinary Tract: No adrenal nodule or mass. Kidneys unremarkable. No evidence for hydroureter. The urinary bladder appears normal for the degree of distention. Stomach/Bowel: Stomach is unremarkable. No gastric wall thickening. No evidence of outlet obstruction. Duodenum is normally positioned as is the ligament of Treitz. No small bowel  wall thickening. No small bowel dilatation. The terminal ileum is normal. The appendix is normal. There is some wall thickening in the splenic flexure of the colon as it courses under the liver. Edema/inflammation is seen around the splenic flexure of the colon and in the right omentum with fluid and edema around the right liver and tracking towards Morison's pouch. Left colon unremarkable. Vascular/Lymphatic: There is moderate atherosclerotic calcification of the abdominal aorta without aneurysm. 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 There is no gastrohepatic or hepatoduodenal ligament lymphadenopathy. No retroperitoneal or mesenteric lymphadenopathy. No pelvic sidewall lymphadenopathy. Reproductive: Prostate gland is heterogeneous. Other: Small volume free fluid is seen in the pelvis. Extraluminal gas is seen in the midline anterior pelvis and anterior left lower quadrant towards the left groin region. This gas may be extraperitoneal in the preperitoneal fat. Musculoskeletal: No worrisome lytic or sclerotic osseous abnormality. IMPRESSION: 1. 4.0 x 3.4 x 2.5 cm complex fluid collection in the gallbladder fossa containing a small gas bubble. Gas from surgery would not be expected after about 7-10 days. Gas could also be due to the presence of the surgical drain, but infection/evolving abscess in the gallbladder fossa  a distinct concern. 2. Edema/inflammation around the splenic flexure of the colon and in the right omentum with fluid and edema around the right liver and tracking towards Morison's pouch. Given the recent surgery and findings in the gallbladder fossa, this is probably secondary inflammatory change. Focal colitis with edema spreading up under the liver is considered less likely 3. Extraluminal gas in the midline anterior pelvis and anterior left lower quadrant towards the left groin region. Location of this small volume gas  suggests that it is extraperitoneal, in the preperitoneal space. Again, while gas after surgery is not typical after about 7-10 days, lack of free gas in the peritoneal cavity makes hollow viscus perforation unlikely. Nevertheless, close clinical follow-up recommended. 4. Small volume free fluid in the pelvis. 5. 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 6.  Aortic Atherosclerosis (ICD10-I70.0). Electronically Signed   By: Kennith Center M.D.   On: 07/20/2023 16:54   DG Chest Port 1 View  Result Date: 07/20/2023 CLINICAL DATA:  Sepsis. Cancer patient. Recent surgery. Known lung mass. EXAM: PORTABLE CHEST 1 VIEW COMPARISON:  X-ray 07/04/2023 and older.  CT angiogram 07/11/2023 FINDINGS: Slight elevation of the left hemidiaphragm. There is some patchy left perihilar opacity with some volume loss which is stable. Fiduciary marker in the peripheral right lung with a spiculated nodule, similar as well. No consolidation, pneumothorax or effusion which is new. Chronic lung changes. Dense likely calcified right apical nodules. Normal cardiopericardial silhouette. Film is rotated to the left. Scattered degenerative changes and osteopenia. Overlapping cardiac leads. IMPRESSION: Stable appearance of the thorax. Fiduciary marker in the right lung with a focal spiculated nodule is stable. Stable bandlike opacity left perihilar with volume loss. Electronically Signed   By: Karen Kays M.D.   On: 07/20/2023 15:40       IMPRESSION/PLAN: 1. 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  Dr. Mitzi Hansen discusses the recent findings from his imaging, also his course to date including his recent infections and hospitalizations.  He discusses that while the PET scan does show hypermetabolic activity in  that area it is difficult to discern whether this is truly recurrent disease versus postradiation inflammatory change.  He is open to the idea of using a palliative course of radiotherapy however feels that this should be followed closer with a repeat image as the patient is already scheduled for this in a few weeks.  We will follow-up with the results of the scan and if there is more of a nodular component to this area then Dr. Mitzi Hansen would consider a palliative course of radiotherapy to this area as well.  The patient and his family are in agreement with this plan.  We did go over  consent and therapy options if we were to proceed in the coming weeks.  We discussed risks benefits short and long-term effects of therapy if we were to proceed at that point versus following him expectantly.  The patient and his family are in agreement with this plan.  In a visit lasting 45 minutes, greater than 50% of the time was spent face to face discussing the patient's condition, in preparation for the discussion, and coordinating the patient's care.   The above documentation reflects my direct findings during this shared patient visit. Please see the separate note by  Dr. Mitzi Hansen on this date for the remainder of the patient's plan of care.    Osker Mason, [facility]   **Disclaimer: This note was dictated with voice recognition software. Similar sounding words can inadvertently be transcribed and this note may contain transcription errors which may not have been corrected upon publication of note.**

## 2023-08-20 ENCOUNTER — Ambulatory Visit (HOSPITAL_COMMUNITY): Admission: RE | Admit: 2023-08-20 | Payer: Medicaid Other | Source: Ambulatory Visit

## 2023-08-24 NOTE — Progress Notes (Signed)
Cardiology Office Note:  .   Date:  09/04/2023  ID:  Todd Estrada, DOB [birth year], MRN [id number] PCP: [name], MD  [facility] Providers Cardiologist:  Christell Constant, MD    History of Present Illness: .   Todd Estrada is a 62 y.o. male  with a hx of asthma/emphysema, atypical chest pain, HTN, hypotension on midodrine, GERD, HLD, lung cancer, COPD, stroke with R sided deficits and aphasia, seizure disorder, PAD, hx PE on eliquis   no know CAD though on CTA of chest in June 2024 +atherosclerosis of coronary arteries including calcified atherosclerotic plaque in the LM, LAD, LCX and RCA.   Echo done 09/2022 with EF 45-50%, global hypokinesis, RV normal. Normal valves.  Prior echos in 02/2022 and 08/2021 EF 40-45% G1DD.  Echo 06/2023 EF 60%   He has PAD with ABIs 2023 with moderate Rt lower extremity arterial disease on severe on the left.    Lung cancer stage IIb non small cell presented with upper Lt lobe lung mass 05/2022 and also non-small cell lung cancer, squamous cell carcinoma presented with right upper lobe lung nodule.  Recently seen by oncology and he has progressive disease with pleural based Rt lung mass and questionable chest wall invasion.  Scheduled for CT for possible  palliative radiotherapy.  In ED 08/03/23 with atypical chest pain-EKG no ischemia, troponin negative. It was felt to be secondary to lung CA. Patient here with his cousin today.He missed the past 2 eliquis doses because his meds ran out. They are getting it filled today.    ROS:    Studies Reviewed: Marland Kitchen         Prior CV Studies:   Echo 06/2023 IMPRESSIONS     1. Left ventricular ejection fraction, by estimation, is 60 to 65%. The  left ventricle has normal function. Left ventricular endocardial border  not optimally defined to evaluate regional wall motion. There is mild left  ventricular hypertrophy. Left  ventricular diastolic parameters are consistent with Grade I diastolic   dysfunction (impaired relaxation).   2. Right ventricular systolic function is mildly reduced. The right  ventricular size is normal.   3. The mitral valve is normal in structure. Trivial mitral valve  regurgitation. No evidence of mitral stenosis.   4. The aortic valve was not well visualized. Aortic valve regurgitation  is not visualized. No aortic stenosis is present.   5. Aortic dilatation noted. There is borderline dilatation of the  ascending aorta, measuring 39 mm.   6. The inferior vena cava is normal in size with greater than 50%  respiratory variability, suggesting right atrial pressure of 3 mmHg.   7. Technically very limited study with poor images even with Definity  contrast. LV function appears normal. Suspect RV function mildly  decreased.   PET 07/2023 IMPRESSION: 1. There is a bandlike area of fibrosis and architectural distortion within the perihilar left mid lung reflecting changes secondary to external beam radiation. Corresponding increased uptake within these areas reflecting post radiation inflammation. 2. The treated left upper lobe lung lesion measures 2.1 x 1.3 cm with SUV max 3.50. This is decreased in size when compared with the CT from 06/03/2023. 3. Within the periphery of the right midlung there is a bandlike area of fibrosis and masslike architectural distortion in the distribution of previously noted peripheral right upper lobe lung lesion (as indicated by underlying fiducial marker). The treated tumor is indistinguishable from the changes of external beam radiation and no  longer confidently identified separate from these areas. Increased uptake within the area of radiation change has an SUV max of 4.49. 4. No signs of tracer avid nodal metastasis or distant metastatic disease. 5. There is a rim of increased uptake within the region of the gallbladder fossa which has an SUV max of 7.22. This is favored to represent residual inflammatory change in  this patient who has a recent history of cholecystectomy complicated by abscess. 6. Aortic Atherosclerosis (ICD10-I70.0) and Emphysema (ICD10-J43.9).     Electronically Signed   By: Signa Kell M.D.   On: 08/17/2023 08:12     Risk Assessment/Calculations:             Physical Exam:   VS:  BP 110/60   Pulse (!) 102   Ht 6' (1.829 m)   Wt 172 lb 9.6 oz (78.3 kg)   SpO2 95%   BMI 23.41 kg/m    Wt Readings from Last 3 Encounters:  09/04/23 172 lb 9.6 oz (78.3 kg)  08/17/23 177 lb (80.3 kg)  08/03/23 175 lb (79.4 kg)    GEN: thin aphasic, in no acute distress NECK: No JVD; No carotid bruits CARDIAC:  RRR, no murmurs, rubs, gallops RESPIRATORY:  decreased breath sounds throughout ABDOMEN: Soft, non-tender, non-distended EXTREMITIES:  No edema; No deformity   ASSESSMENT AND PLAN: .     Coronary calcification on CT 05/2023-no angina. Chest pain felt secondary to progressive lung CA  HF recovered EF-Decreased LV function in past-normalized in 06/2023-see above.  HLD-LDL 33 06/2023  PAD on lipitor  Hypotension on midodrine  History of CVA with aphasia  History of PE on eliquis  Lung CA progressive. For CT Wed to see if he qualifies for palliative radiation        Dispo: f/u in 1 yr  Signed, Jacolyn Reedy, PA-C

## 2023-09-04 ENCOUNTER — Encounter: Payer: Self-pay | Admitting: Physician Assistant

## 2023-09-04 ENCOUNTER — Other Ambulatory Visit: Payer: Self-pay

## 2023-09-04 ENCOUNTER — Ambulatory Visit: Payer: Medicaid Other | Attending: Physician Assistant | Admitting: Physician Assistant

## 2023-09-04 VITALS — BP 110/60 | HR 102 | Ht 72.0 in | Wt 172.6 lb

## 2023-09-04 DIAGNOSIS — Z86711 Personal history of pulmonary embolism: Secondary | ICD-10-CM | POA: Diagnosis not present

## 2023-09-04 DIAGNOSIS — I2510 Atherosclerotic heart disease of native coronary artery without angina pectoris: Secondary | ICD-10-CM

## 2023-09-04 DIAGNOSIS — I739 Peripheral vascular disease, unspecified: Secondary | ICD-10-CM

## 2023-09-04 DIAGNOSIS — E785 Hyperlipidemia, unspecified: Secondary | ICD-10-CM | POA: Diagnosis not present

## 2023-09-04 DIAGNOSIS — Z8673 Personal history of transient ischemic attack (TIA), and cerebral infarction without residual deficits: Secondary | ICD-10-CM

## 2023-09-04 DIAGNOSIS — C3490 Malignant neoplasm of unspecified part of unspecified bronchus or lung: Secondary | ICD-10-CM

## 2023-09-04 NOTE — Patient Instructions (Signed)
Medication Instructions:  Your physician recommends that you continue on your current medications as directed. Please refer to the Current Medication list given to you today. *If you need a refill on your cardiac medications before your next appointment, please call your pharmacy*   Lab Work: None ordered   Testing/Procedures: None ordered   Follow-Up: At [facility], you and your health needs are our priority.  As part of our continuing mission to provide you with exceptional heart care, we have created designated Provider Care Teams.  These Care Teams include your primary Cardiologist (physician) and Advanced Practice Providers (APPs -  Physician Assistants and Nurse Practitioners) who all work together to provide you with the care you need, when you need it.  We recommend signing up for the patient portal called "MyChart".  Sign up information is provided on this After Visit Summary.  MyChart is used to connect with patients for Virtual Visits (Telemedicine).  Patients are able to view lab/test results, encounter notes, upcoming appointments, etc.  Non-urgent messages can be sent to your provider as well.   To learn more about what you can do with MyChart, go to [email or web address].    Your next appointment:   12 month(s)  Provider:   Christell Constant, MD     Other Instructions

## 2023-09-06 ENCOUNTER — Ambulatory Visit (HOSPITAL_COMMUNITY): Payer: Medicaid Other

## 2023-09-07 ENCOUNTER — Other Ambulatory Visit: Payer: Self-pay | Admitting: Student

## 2023-09-07 MED ORDER — PANTOPRAZOLE SODIUM 40 MG PO TBEC: 40.0000 mg | DELAYED_RELEASE_TABLET | Freq: Every day | ORAL | 0 refills | Status: DC

## 2023-09-07 NOTE — Telephone Encounter (Signed)
Patient is scheduled to return 10/2 for a fu visit.

## 2023-09-07 NOTE — Telephone Encounter (Signed)
Will refill for 1 month. It looks like he no showed his last appt and I don't see a follow up appointment for him in the future - could we schedule one for him in the next few months? Thank you

## 2023-09-08 ENCOUNTER — Emergency Department (HOSPITAL_COMMUNITY): Payer: Medicaid Other

## 2023-09-08 ENCOUNTER — Other Ambulatory Visit: Payer: Self-pay

## 2023-09-08 ENCOUNTER — Emergency Department (HOSPITAL_COMMUNITY)
Admission: EM | Admit: 2023-09-08 | Discharge: 2023-09-09 | Disposition: A | Discharge status: Home / Self Care | Payer: Medicaid Other | Attending: Emergency Medicine | Admitting: Emergency Medicine

## 2023-09-08 DIAGNOSIS — K648 Other hemorrhoids: Secondary | ICD-10-CM | POA: Insufficient documentation

## 2023-09-08 DIAGNOSIS — I10 Essential (primary) hypertension: Secondary | ICD-10-CM | POA: Diagnosis not present

## 2023-09-08 DIAGNOSIS — D649 Anemia, unspecified: Secondary | ICD-10-CM | POA: Insufficient documentation

## 2023-09-08 DIAGNOSIS — R918 Other nonspecific abnormal finding of lung field: Secondary | ICD-10-CM | POA: Insufficient documentation

## 2023-09-08 DIAGNOSIS — I69320 Aphasia following cerebral infarction: Secondary | ICD-10-CM | POA: Insufficient documentation

## 2023-09-08 DIAGNOSIS — Z85118 Personal history of other malignant neoplasm of bronchus and lung: Secondary | ICD-10-CM | POA: Insufficient documentation

## 2023-09-08 DIAGNOSIS — R079 Chest pain, unspecified: Secondary | ICD-10-CM

## 2023-09-08 DIAGNOSIS — K644 Residual hemorrhoidal skin tags: Secondary | ICD-10-CM | POA: Diagnosis not present

## 2023-09-08 DIAGNOSIS — J449 Chronic obstructive pulmonary disease, unspecified: Secondary | ICD-10-CM | POA: Diagnosis not present

## 2023-09-08 DIAGNOSIS — Z7901 Long term (current) use of anticoagulants: Secondary | ICD-10-CM | POA: Diagnosis not present

## 2023-09-08 DIAGNOSIS — K5900 Constipation, unspecified: Secondary | ICD-10-CM | POA: Diagnosis not present

## 2023-09-08 LAB — CBC
HCT: 38.9 % — ABNORMAL LOW (ref 39.0–52.0)
Hemoglobin: 12.5 g/dL — ABNORMAL LOW (ref 13.0–17.0)
MCH: 30.1 pg (ref 26.0–34.0)
MCHC: 32.1 g/dL (ref 30.0–36.0)
MCV: 93.7 fL (ref 80.0–100.0)
Platelets: 163 10*3/uL (ref 150–400)
RBC: 4.15 MIL/uL — ABNORMAL LOW (ref 4.22–5.81)
RDW: 14 % (ref 11.5–15.5)
WBC: 5.6 10*3/uL (ref 4.0–10.5)
nRBC: 0 % (ref 0.0–0.2)

## 2023-09-08 LAB — BASIC METABOLIC PANEL
Anion gap: 15 (ref 5–15)
BUN: 14 mg/dL (ref 8–23)
CO2: 26 mmol/L (ref 22–32)
Calcium: 9.3 mg/dL (ref 8.9–10.3)
Chloride: 101 mmol/L (ref 98–111)
Creatinine, Ser: 0.82 mg/dL (ref 0.61–1.24)
GFR, Estimated: >60 mL/min (ref >60)
Glucose, Bld: 117 mg/dL — ABNORMAL HIGH (ref 70–99)
Potassium: 4 mmol/L (ref 3.5–5.1)
Sodium: 142 mmol/L (ref 135–145)

## 2023-09-08 LAB — TROPONIN I (HIGH SENSITIVITY)
Troponin I (High Sensitivity): 6 ng/L (ref <18)
Troponin I (High Sensitivity): 7 ng/L (ref <18)

## 2023-09-08 MED ORDER — OXYCODONE-ACETAMINOPHEN 5-325 MG PO TABS
1.0000 | ORAL_TABLET | Freq: Once | ORAL | Status: AC
  Administered 2023-09-08: 1 via ORAL
  Filled 2023-09-08: qty 1

## 2023-09-08 MED ORDER — MORPHINE SULFATE (PF) 4 MG/ML IV SOLN: 4.0000 mg | Freq: Once | INTRAVENOUS | Status: DC

## 2023-09-08 MED ORDER — IOHEXOL 350 MG/ML SOLN
75.0000 mL | Freq: Once | INTRAVENOUS | Status: AC | PRN
  Administered 2023-09-08: 75 mL via INTRAVENOUS

## 2023-09-08 MED ORDER — OXYCODONE-ACETAMINOPHEN 5-325 MG PO TABS
1.0000 | ORAL_TABLET | Freq: Four times a day (QID) | ORAL | 0 refills | Status: DC | PRN
Start: 2023-09-08 — End: 2023-09-09
  Filled 2023-09-08: qty 10, 3d supply, fill #0

## 2023-09-08 NOTE — ED Provider Notes (Signed)
11:23 PM Assumed care from Dr. Silverio Lay, please see their note for full history, physical and decision making until this point. In brief this is a 62 y.o. year old male who presented to the ED tonight with Chest Pain     H/o lung CA, R CP, new R nodule, second trop pending, likely CA related pain.   Troponin negative. No new questions. Stable for d/c w/ pcp follow up. Hem/onc referral.  Discharge instructions, including strict return precautions for new or worsening symptoms, given. Patient and/or family verbalized understanding and agreement with the plan as described.   Labs, studies and imaging reviewed by myself and considered in medical decision making if ordered. Imaging interpreted by radiology.  Labs Reviewed  BASIC METABOLIC PANEL - Abnormal; Notable for the following components:      Result Value   Glucose, Bld 117 (*)    All other components within normal limits  CBC - Abnormal; Notable for the following components:   RBC 4.15 (*)    Hemoglobin 12.5 (*)    HCT 38.9 (*)    All other components within normal limits  TROPONIN I (HIGH SENSITIVITY)  TROPONIN I (HIGH SENSITIVITY)    CT Angio Chest PE W and/or Wo Contrast  Final Result    DG Chest 2 View  Final Result      No follow-ups on file.    Jazzmine Kleiman, Barbara Cower, MD 09/09/23 [phone]

## 2023-09-08 NOTE — ED Triage Notes (Signed)
The pt is c/o rt sided chest pain for a while  he has cancer in his lt lung and the doctor thinks that his heart is fine and he thinks that the cancer in his rt lung in causing his pain

## 2023-09-08 NOTE — ED Notes (Signed)
Cousin called to come pick up patient.

## 2023-09-08 NOTE — ED Provider Notes (Signed)
[facility] AT [facility] Provider Note   CSN: [id number] Arrival date & time: 09/08/23  1909     History  Chief Complaint  Patient presents with   Chest Pain    Todd Estrada is a 62 y.o. male history of COPD, hypertension, baseline aphasia from previous stroke, PE on Eliquis here presenting with shortness of breath.  Patient has been having right-sided chest pain for several weeks now.  Patient was seen in the ED about a month ago and had unremarkable workup.  Patient just saw cardiology 4 days ago and was thought to have progression of his cancer.  Patient continues to complains of chest pain so came here for further evaluation.  Patient states that he is compliant with his Eliquis  The history is provided by the patient.       Home Medications Prior to Admission medications   Medication Sig Start Date End Date Taking? Authorizing Provider  albuterol (PROVENTIL) (2.5 MG/3ML) 0.083% nebulizer solution Take 3 mLs (2.5 mg total) by nebulization every 4 (four) hours as needed for wheezing or shortness of breath. 08/16/22   Steffanie Rainwater, MD  apixaban (ELIQUIS) 5 MG TABS tablet TAKE 1 TABLET(5 MG) BY MOUTH TWICE DAILY Patient taking differently: Take 5 mg by mouth 2 (two) times daily. 04/11/23   Merrilyn Puma, MD  atorvastatin (LIPITOR) 40 MG tablet Take 1 tablet (40 mg total) by mouth daily. 01/03/23 01/03/24  Merrilyn Puma, MD  budesonide (PULMICORT) 0.5 MG/2ML nebulizer solution NEW PRESCRIPTION REQEUST: BUDESONIDE 0.5 MG/ - USE ONE VIAL TWICE DAILY Patient not taking: Reported on 08/19/2023 11/29/22   Martina Sinner, MD  levETIRAcetam (KEPPRA) 500 MG tablet Take 1 tablet (500 mg total) by mouth 2 (two) times daily. 12/07/22   Ihor Austin, NP  midodrine (PROAMATINE) 2.5 MG tablet Take 2.5 mg by mouth 2 (two) times daily with a meal.    [provider]  Multiple Vitamin (MULTIVITAMIN WITH MINERALS) TABS tablet Take 1 tablet by  mouth daily.    [provider]  pantoprazole (PROTONIX) 40 MG tablet Take 40 mg by mouth daily.    [provider]  pantoprazole (PROTONIX) 40 MG tablet Take 1 tablet (40 mg total) by mouth daily. 09/07/23 10/07/23  [name], MD  sucralfate (CARAFATE) 1 GM/10ML suspension Take 10 mLs (1 g total) by mouth 4 (four) times daily -  with meals and at bedtime. 07/02/23   Nira Conn, MD  Tiotropium Bromide-Olodaterol (STIOLTO RESPIMAT) 2.5-2.5 MCG/ACT AERS NEW PRESCRIPTION REQUEST: STIOLTO 2.5 MCG- INHALE TWO PUFFS BY MOUTH DAILY Patient taking differently: Inhale 2 puffs into the lungs daily. 11/29/22   Martina Sinner, MD      Allergies    Patient has no known allergies.    Review of Systems   Review of Systems  Cardiovascular:  Positive for chest pain.  All other systems reviewed and are negative.   Physical Exam Updated Vital Signs BP 119/79 (BP Location: Left Arm)   Pulse (!) 107   Temp 97.7 F (36.5 C)   Resp 19   Ht 6' (1.829 m)   Wt 78.3 kg   SpO2 97%   BMI 23.41 kg/m  Physical Exam Vitals and nursing note reviewed.  Constitutional:      Comments: Chronically ill and aphasic at baseline  HENT:     Head: Normocephalic.  Eyes:     Extraocular Movements: Extraocular movements intact.     Pupils: Pupils are  equal, round, and reactive to light.  Cardiovascular:     Rate and Rhythm: Normal rate and regular rhythm.     Heart sounds: Normal heart sounds.  Pulmonary:     Effort: Pulmonary effort is normal.     Breath sounds: Normal breath sounds.  Chest:     Comments: Reproducible right chest wall tenderness Abdominal:     General: Bowel sounds are normal.     Palpations: Abdomen is soft.  Musculoskeletal:        General: Normal range of motion.     Cervical back: Normal range of motion and neck supple.  Skin:    General: Skin is warm.     Capillary Refill: Capillary refill takes less than 2 seconds.  Neurological:     General: No  focal deficit present.     Mental Status: He is alert.  Psychiatric:        Mood and Affect: Mood normal.        Behavior: Behavior normal.     ED Results / Procedures / Treatments   Labs (all labs ordered are listed, but only abnormal results are displayed) Labs Reviewed  CBC - Abnormal; Notable for the following components:      Result Value   RBC 4.15 (*)    Hemoglobin 12.5 (*)    HCT 38.9 (*)    All other components within normal limits  BASIC METABOLIC PANEL  TROPONIN I (HIGH SENSITIVITY)    EKG EKG Interpretation Date/Time:  Friday September 08 2023 19:57:19 EDT Ventricular Rate:  107 PR Interval:  154 QRS Duration:  90 QT Interval:  324 QTC Calculation: 432 R Axis:   73  Text Interpretation: Sinus tachycardia with frequent Premature ventricular complexes Otherwise normal ECG When compared with ECG of 19-Aug-2023 20:39, PREVIOUS ECG IS PRESENT Confirmed by Richardean Canal [phone]) on 09/08/2023 8:38:53 PM  Radiology No results found.  Procedures Procedures    Medications Ordered in ED Medications  morphine (PF) 4 MG/ML injection 4 mg (has no administration in time range)    ED Course/ Medical Decision Making/ A&P                                 Medical Decision Making Todd Estrada is a 62 y.o. male here presenting with right-sided chest pain.  Patient does have history of lung cancer.  Consider recurrent cancer versus PE.  Low suspicion for ACS.  Will get troponin x 2 and CTA chest.  Will give pain medicine and reassess.  11:14 PM I reviewed patient's labs and independently interpreted imaging studies.  Chest x-ray showed mass in the right midlung.  CT showed lung nodule versus mass.  Will prescribe pain medicine.  Will have patient follow-up with oncology, Dr. Mitzi Hansen.  Second troponin pending anticipate if it is normal, patient can be discharged.   Problems Addressed: Chest pain, unspecified type: acute illness or injury Lung mass: acute illness or  injury  Amount and/or Complexity of Data Reviewed Labs: ordered. Decision-making details documented in ED Course. Radiology: ordered and independent interpretation performed. Decision-making details documented in ED Course.  Risk Prescription drug management.    Final Clinical Impression(s) / ED Diagnoses Final diagnoses:  None    Rx / DC Orders ED Discharge Orders     None         Charlynne Pander, MD 09/08/23 2315

## 2023-09-08 NOTE — Discharge Instructions (Signed)
As we discussed, you have recurrent lung mass.  I have prescribed Percocet as needed for pain.  Please call Dr. Mitzi Hansen for follow-up.  Return to ER if you have worse chest pain or shortness of breath

## 2023-09-08 NOTE — ED Notes (Signed)
Patient transported to CT 

## 2023-09-09 ENCOUNTER — Encounter (HOSPITAL_COMMUNITY): Payer: Self-pay | Admitting: *Deleted

## 2023-09-09 ENCOUNTER — Emergency Department (HOSPITAL_COMMUNITY)
Admission: EM | Admit: 2023-09-09 | Discharge: 2023-09-10 | Disposition: A | Discharge status: Home / Self Care | Payer: Medicaid Other | Source: Home / Self Care | Attending: Emergency Medicine | Admitting: Emergency Medicine

## 2023-09-09 ENCOUNTER — Other Ambulatory Visit: Payer: Self-pay

## 2023-09-09 DIAGNOSIS — I10 Essential (primary) hypertension: Secondary | ICD-10-CM | POA: Insufficient documentation

## 2023-09-09 DIAGNOSIS — J449 Chronic obstructive pulmonary disease, unspecified: Secondary | ICD-10-CM | POA: Insufficient documentation

## 2023-09-09 DIAGNOSIS — K644 Residual hemorrhoidal skin tags: Secondary | ICD-10-CM | POA: Insufficient documentation

## 2023-09-09 DIAGNOSIS — D649 Anemia, unspecified: Secondary | ICD-10-CM | POA: Insufficient documentation

## 2023-09-09 DIAGNOSIS — Z7901 Long term (current) use of anticoagulants: Secondary | ICD-10-CM | POA: Insufficient documentation

## 2023-09-09 DIAGNOSIS — K5900 Constipation, unspecified: Secondary | ICD-10-CM | POA: Insufficient documentation

## 2023-09-09 DIAGNOSIS — K648 Other hemorrhoids: Secondary | ICD-10-CM | POA: Insufficient documentation

## 2023-09-09 LAB — POC OCCULT BLOOD, ED: Fecal Occult Bld: NEGATIVE

## 2023-09-09 MED ORDER — OXYCODONE-ACETAMINOPHEN 5-325 MG PO TABS
1.0000 | ORAL_TABLET | Freq: Four times a day (QID) | ORAL | 0 refills | Status: AC | PRN
Start: 2023-09-09 — End: ?

## 2023-09-09 NOTE — ED Provider Notes (Signed)
[place] EMERGENCY DEPARTMENT AT [facility] Provider Note   CSN: [id number] Arrival date & time: 09/09/23  2122   History {Add pertinent medical, surgical, social history, OB history to HPI:1} Chief Complaint  Patient presents with   Rectal Pain    CHAD HOERIG is a 62 y.o. 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  History is exceptionally difficult to obtain given patient's aphasia.  Continues to point to his anus and when asked if he has pain he says yes.  Denies diarrhea, endorses that he is having normal bowel movements and urination.  Level 5 caveat due to baseline aphasia upon arrival.  Patient additionally has history of lung cancer, concern for progression at recent visit, following up with oncology. HPI     Home Medications Prior to Admission medications   Medication Sig Start Date End Date Taking? Authorizing Provider  albuterol (PROVENTIL) (2.5 MG/3ML) 0.083% nebulizer solution Take 3 mLs (2.5 mg total) by nebulization every 4 (four) hours as needed for wheezing or shortness of breath. 08/16/22   Steffanie Rainwater, MD  apixaban (ELIQUIS) 5 MG TABS tablet TAKE 1 TABLET(5 MG) BY MOUTH TWICE DAILY Patient taking differently: Take 5 mg by mouth 2 (two) times daily. 04/11/23   Merrilyn Puma, MD  atorvastatin (LIPITOR) 40 MG tablet Take 1 tablet (40 mg total) by mouth daily. 01/03/23 01/03/24  Merrilyn Puma, MD  budesonide (PULMICORT) 0.5 MG/2ML nebulizer solution NEW PRESCRIPTION REQEUST: BUDESONIDE 0.5 MG/ - USE ONE VIAL TWICE DAILY Patient not taking: Reported on 08/19/2023 11/29/22   Martina Sinner, MD  levETIRAcetam (KEPPRA) 500 MG tablet Take 1 tablet (500 mg total) by mouth 2 (two) times daily. 12/07/22   Ihor Austin, NP  midodrine (PROAMATINE) 2.5 MG tablet Take 2.5 mg by mouth 2 (two) times daily with a meal.    [provider]  Multiple Vitamin (MULTIVITAMIN WITH MINERALS) TABS tablet Take 1 tablet by mouth daily.    [provider]  oxyCODONE-acetaminophen (PERCOCET) 5-325 MG tablet Take 1 tablet by mouth every 6 (six) hours as needed. 09/09/23   Mesner, Barbara Cower, MD  pantoprazole (PROTONIX) 40 MG tablet Take 40 mg by mouth daily.    [provider]  pantoprazole (PROTONIX) 40 MG tablet Take 1 tablet (40 mg total) by mouth daily. 09/07/23 10/07/23  [name], MD  sucralfate (CARAFATE) 1 GM/10ML suspension Take 10 mLs (1 g total) by mouth 4 (four) times daily -  with meals and at bedtime. 07/02/23   Nira Conn, MD  Tiotropium Bromide-Olodaterol (STIOLTO RESPIMAT) 2.5-2.5 MCG/ACT AERS NEW PRESCRIPTION REQUEST: STIOLTO 2.5 MCG- INHALE TWO PUFFS BY MOUTH DAILY Patient taking differently: Inhale 2 puffs into the lungs daily. 11/29/22   Martina Sinner, MD      Allergies    Patient has no known allergies.    Review of Systems   Review of Systems  Unable to perform ROS: Other (aphasia)    Physical Exam Updated Vital Signs BP 112/75   Pulse (!) 110   Temp 98.7 F (37.1 C) (Oral)   SpO2 99%  Physical Exam Vitals and nursing note reviewed. 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  Constitutional:      Appearance: He is not toxic-appearing.  HENT:     Head: Normocephalic and atraumatic.     Mouth/Throat:     Mouth: Mucous membranes are  moist.     Pharynx: No oropharyngeal exudate or posterior oropharyngeal erythema.  Eyes:     General:        Right eye: No discharge.        Left eye: No discharge.     Conjunctiva/sclera: Conjunctivae normal.  Cardiovascular:     Rate and Rhythm: Normal rate and regular rhythm.     Pulses: Normal pulses.     Heart sounds: Normal heart sounds.  Pulmonary:     Effort: Pulmonary effort is normal. No respiratory distress.     Breath sounds: Normal breath sounds. No wheezing or rales.  Abdominal:     General:  Bowel sounds are normal. There is no distension.     Palpations: Abdomen is soft.     Tenderness: There is generalized abdominal tenderness. There is no right CVA tenderness, guarding or rebound.  Genitourinary:    Testes: Normal.     Rectum: External hemorrhoid and internal hemorrhoid present. No tenderness.  Musculoskeletal:        General: No deformity.     Cervical back: Neck supple.     Right lower leg: No edema.     Left lower leg: No edema.  Skin:    General: Skin is warm and dry.     Capillary Refill: Capillary refill takes less than 2 seconds.  Neurological:     Mental Status: He is alert. Mental status is at baseline.  Psychiatric:        Mood and Affect: Mood normal.     ED Results / Procedures / Treatments   Labs (all labs ordered are listed, but only abnormal results are displayed) Labs Reviewed  CBC WITH DIFFERENTIAL/PLATELET  COMPREHENSIVE METABOLIC PANEL  LIPASE, BLOOD  URINALYSIS, ROUTINE W REFLEX MICROSCOPIC  POC OCCULT BLOOD, ED    EKG None  Radiology CT Angio Chest PE W and/or Wo Contrast  Result Date: 09/08/2023 CLINICAL DATA:  Lung cancer, high probability pulmonary embolism EXAM: CT ANGIOGRAPHY CHEST WITH CONTRAST TECHNIQUE: Multidetector CT imaging of the chest was performed using the standard protocol during bolus administration of intravenous contrast. Multiplanar CT image reconstructions and MIPs were obtained to evaluate the vascular anatomy. RADIATION DOSE REDUCTION: This exam was performed according to the departmental dose-optimization program which includes automated exposure control, adjustment of the mA and/or kV according to patient size and/or use of iterative reconstruction technique. CONTRAST:  75mL OMNIPAQUE IOHEXOL 350 MG/ML SOLN COMPARISON:  07/12/2023 FINDINGS: Cardiovascular: Adequate opacification the pulmonary arterial tree. No intraluminal filling defect identified to suggest acute pulmonary embolism. Central pulmonary arteries are  of normal caliber. Moderate multi-vessel coronary artery calcification. Global cardiac size within normal limits. No pericardial effusion. Mild atherosclerotic calcification within the thoracic aorta. No aortic aneurysm. Mediastinum/Nodes: No enlarged mediastinal, hilar, or axillary lymph nodes. Thyroid gland, trachea, and esophagus demonstrate no significant findings. Lungs/Pleura: Severe centrilobular emphysema. Diffuse bronchial wall thickening again identified in keeping with airway inflammation, asymmetrically more severe within the left lung which may reflect a superimposed component of related radiation change. Band like parenchymal scarring within the left upper lobe anteriorly extending to the perihilar region appears stable possibly representing post radiation changes. Previously noted left lower lobe pulmonary nodules are no longer visualized. Masslike consolidation within the peripheral right upper lobe with multiple fiducial markers in place in keeping with the patient's known right-sided malignancy appears stable since prior examination reflecting the residua of radiation therapy. New 2.2 cm pulmonary nodule noted within the subpleural right lower lobe at axial  image # 126/6 is indeterminate, but favored to be infectious or inflammatory given its relatively rapid development since prior examination. No pneumothorax or pleural effusion. No central obstructing lesion. Upper Abdomen: Status post cholecystectomy. Pneumobilia likely reflects changes of prior sphincterotomy. No acute abnormality. Musculoskeletal: No acute bone abnormality. No lytic or blastic bone lesion. Stable 2.6 cm subcutaneous soft tissue mass within the visualized right posterior chest wall, axial image # 59/5, indeterminate but stable since remote prior examination of 03/18/2022. Review of the MIP images confirms the above findings. IMPRESSION: 1. No pulmonary embolism. 2. Moderate multi-vessel coronary artery calcification. 3.  Advanced emphysema. 4. Stable post radiation changes within the left upper lobe and right upper lobe. 5. New 2.2 cm pulmonary nodule within the subpleural right lower lobe, indeterminate, but favored to be infectious or inflammatory given its relatively rapid development since prior examination. Short-term follow-up imaging in 6-8 weeks would be helpful in documenting resolution. Aortic Atherosclerosis (ICD10-I70.0) and Emphysema (ICD10-J43.9). Electronically Signed   By: Helyn Numbers M.D.   On: 09/08/2023 22:38   DG Chest 2 View  Result Date: 09/08/2023 CLINICAL DATA:  Right chest pain, lung cancer EXAM: CHEST - 2 VIEW COMPARISON:  08/19/2023 FINDINGS: The lungs are hyperinflated in keeping with changes of underlying emphysema, better seen on CT examination of 07/12/2023. Spiculated peripheral mass within the right mid lung zone demonstrating a a fiducial marker appears grossly unchanged from prior examination architectural distortion and parenchymal scarring within the left perihilar region and left upper lobe anteriorly may reflect post radiation change in appears stable since prior examination. No confluent pulmonary infiltrate. No pneumothorax or pleural effusion. Cardiac size within normal limits. Osseous structures are age-appropriate. IMPRESSION: 1. Emphysema. 2. Stable spiculated peripheral mass within the right mid lung zone demonstrating a fiducial marker. 3. Stable architectural distortion and parenchymal scarring within the left perihilar region and left upper lobe anteriorly, likely post radiation change. Electronically Signed   By: Helyn Numbers M.D.   On: 09/08/2023 22:25    Procedures Procedures  {Document cardiac monitor, telemetry assessment procedure when appropriate:1}  Medications Ordered in ED Medications - No data to display  ED Course/ Medical Decision Making/ A&P   {   Click here for ABCD2, HEART and other calculatorsREFRESH Note before signing :1}                               Medical Decision Making 62 year old aphasic male who presents with concern for suspected rectal pain.  Tachycardic on intake VS otherwise normal. Cardiopulmonary exam unremarkable, abdominal exam with generalized TTP. GU exam as above  Amount and/or Complexity of Data Reviewed Labs: ordered.   ***  {Document critical care time when appropriate:1} {Document review of labs and clinical decision tools ie heart score, Chads2Vasc2 etc:1}  {Document your independent review of radiology images, and any outside records:1} {Document your discussion with family members, caretakers, and with consultants:1} {Document social determinants of health affecting pt's care:1} {Document your decision making why or why not admission, treatments were needed:1} Final Clinical Impression(s) / ED Diagnoses Final diagnoses:  None    Rx / DC Orders ED Discharge Orders     None

## 2023-09-09 NOTE — ED Triage Notes (Signed)
Pt reporting buttocks pain, first says lower back then points to rectal area. Denies bleeding or hemorrhoids

## 2023-09-10 ENCOUNTER — Emergency Department (HOSPITAL_COMMUNITY): Payer: Medicaid Other

## 2023-09-10 LAB — COMPREHENSIVE METABOLIC PANEL
ALT: 15 U/L (ref 0–44)
AST: 17 U/L (ref 15–41)
Albumin: 3.6 g/dL (ref 3.5–5.0)
Alkaline Phosphatase: 104 U/L (ref 38–126)
Anion gap: 9 (ref 5–15)
BUN: 15 mg/dL (ref 8–23)
CO2: 29 mmol/L (ref 22–32)
Calcium: 8.7 mg/dL — ABNORMAL LOW (ref 8.9–10.3)
Chloride: 104 mmol/L (ref 98–111)
Creatinine, Ser: 0.82 mg/dL (ref 0.61–1.24)
GFR, Estimated: >60 mL/min (ref >60)
Glucose, Bld: 101 mg/dL — ABNORMAL HIGH (ref 70–99)
Potassium: 3.5 mmol/L (ref 3.5–5.1)
Sodium: 142 mmol/L (ref 135–145)
Total Bilirubin: 0.4 mg/dL (ref 0.3–1.2)
Total Protein: 6.6 g/dL (ref 6.5–8.1)

## 2023-09-10 LAB — URINALYSIS, ROUTINE W REFLEX MICROSCOPIC
Bilirubin Urine: NEGATIVE
Glucose, UA: NEGATIVE mg/dL
Hgb urine dipstick: NEGATIVE
Ketones, ur: NEGATIVE mg/dL
Leukocytes,Ua: NEGATIVE
Nitrite: NEGATIVE
Protein, ur: NEGATIVE mg/dL
Specific Gravity, Urine: 1.023 (ref 1.005–1.030)
pH: 6 (ref 5.0–8.0)

## 2023-09-10 LAB — CBC WITH DIFFERENTIAL/PLATELET
Abs Immature Granulocytes: 0.02 10*3/uL (ref 0.00–0.07)
Basophils Absolute: 0 10*3/uL (ref 0.0–0.1)
Basophils Relative: 1 %
Eosinophils Absolute: 0.5 10*3/uL (ref 0.0–0.5)
Eosinophils Relative: 9 %
HCT: 35.2 % — ABNORMAL LOW (ref 39.0–52.0)
Hemoglobin: 11.2 g/dL — ABNORMAL LOW (ref 13.0–17.0)
Immature Granulocytes: 0 %
Lymphocytes Relative: 19 %
Lymphs Abs: 1.1 10*3/uL (ref 0.7–4.0)
MCH: 30.1 pg (ref 26.0–34.0)
MCHC: 31.8 g/dL (ref 30.0–36.0)
MCV: 94.6 fL (ref 80.0–100.0)
Monocytes Absolute: 0.6 10*3/uL (ref 0.1–1.0)
Monocytes Relative: 11 %
Neutro Abs: 3.4 10*3/uL (ref 1.7–7.7)
Neutrophils Relative %: 60 %
Platelets: 143 10*3/uL — ABNORMAL LOW (ref 150–400)
RBC: 3.72 MIL/uL — ABNORMAL LOW (ref 4.22–5.81)
RDW: 14 % (ref 11.5–15.5)
WBC: 5.7 10*3/uL (ref 4.0–10.5)
nRBC: 0 % (ref 0.0–0.2)

## 2023-09-10 LAB — LIPASE, BLOOD: Lipase: 26 U/L (ref 11–51)

## 2023-09-10 MED ORDER — IOHEXOL 300 MG/ML  SOLN
100.0000 mL | Freq: Once | INTRAMUSCULAR | Status: AC | PRN
  Administered 2023-09-10: 100 mL via INTRAVENOUS

## 2023-09-10 NOTE — Discharge Instructions (Signed)
Use over-the-counter stool softener such as Dulcolax or MiraLAX to assist with your constipation.  There is no dangerous finding identified in your workup in the ER today.  Follow-up with your primary care doctor.

## 2023-09-11 ENCOUNTER — Other Ambulatory Visit: Payer: Self-pay

## 2023-09-13 ENCOUNTER — Inpatient Hospital Stay: Payer: Medicaid Other | Attending: Internal Medicine

## 2023-09-13 DIAGNOSIS — C3490 Malignant neoplasm of unspecified part of unspecified bronchus or lung: Secondary | ICD-10-CM

## 2023-09-13 DIAGNOSIS — C3411 Malignant neoplasm of upper lobe, right bronchus or lung: Secondary | ICD-10-CM | POA: Diagnosis present

## 2023-09-13 DIAGNOSIS — C3412 Malignant neoplasm of upper lobe, left bronchus or lung: Secondary | ICD-10-CM | POA: Diagnosis present

## 2023-09-13 LAB — CMP (CANCER CENTER ONLY)
ALT: 12 U/L (ref 0–44)
AST: 15 U/L (ref 15–41)
Albumin: 3.8 g/dL (ref 3.5–5.0)
Alkaline Phosphatase: 91 U/L (ref 38–126)
Anion gap: 6 (ref 5–15)
BUN: 13 mg/dL (ref 8–23)
CO2: 30 mmol/L (ref 22–32)
Calcium: 9.3 mg/dL (ref 8.9–10.3)
Chloride: 107 mmol/L (ref 98–111)
Creatinine: 0.77 mg/dL (ref 0.61–1.24)
GFR, Estimated: >60 mL/min (ref >60)
Glucose, Bld: 94 mg/dL (ref 70–99)
Potassium: 4.1 mmol/L (ref 3.5–5.1)
Sodium: 143 mmol/L (ref 135–145)
Total Bilirubin: 0.4 mg/dL (ref 0.3–1.2)
Total Protein: 6.7 g/dL (ref 6.5–8.1)

## 2023-09-13 LAB — CBC WITH DIFFERENTIAL (CANCER CENTER ONLY)
Abs Immature Granulocytes: 0.01 10*3/uL (ref 0.00–0.07)
Basophils Absolute: 0 10*3/uL (ref 0.0–0.1)
Basophils Relative: 0 %
Eosinophils Absolute: 0.5 10*3/uL (ref 0.0–0.5)
Eosinophils Relative: 10 %
HCT: 37.9 % — ABNORMAL LOW (ref 39.0–52.0)
Hemoglobin: 12 g/dL — ABNORMAL LOW (ref 13.0–17.0)
Immature Granulocytes: 0 %
Lymphocytes Relative: 18 %
Lymphs Abs: 0.9 10*3/uL (ref 0.7–4.0)
MCH: 29.6 pg (ref 26.0–34.0)
MCHC: 31.7 g/dL (ref 30.0–36.0)
MCV: 93.3 fL (ref 80.0–100.0)
Monocytes Absolute: 0.5 10*3/uL (ref 0.1–1.0)
Monocytes Relative: 10 %
Neutro Abs: 3.1 10*3/uL (ref 1.7–7.7)
Neutrophils Relative %: 62 %
Platelet Count: 139 10*3/uL — ABNORMAL LOW (ref 150–400)
RBC: 4.06 MIL/uL — ABNORMAL LOW (ref 4.22–5.81)
RDW: 13.8 % (ref 11.5–15.5)
Smear Review: NORMAL
WBC Count: 5 10*3/uL (ref 4.0–10.5)
nRBC: 0 % (ref 0.0–0.2)

## 2023-09-18 ENCOUNTER — Inpatient Hospital Stay: Payer: Medicaid Other | Admitting: Internal Medicine

## 2023-09-19 ENCOUNTER — Ambulatory Visit (HOSPITAL_COMMUNITY)
Admission: RE | Admit: 2023-09-19 | Discharge: 2023-09-19 | Disposition: A | Discharge status: Home / Self Care | Payer: Medicaid Other | Source: Ambulatory Visit | Attending: Internal Medicine | Admitting: Internal Medicine

## 2023-09-19 DIAGNOSIS — C3490 Malignant neoplasm of unspecified part of unspecified bronchus or lung: Secondary | ICD-10-CM | POA: Insufficient documentation

## 2023-09-19 MED ORDER — IOHEXOL 300 MG/ML  SOLN
75.0000 mL | Freq: Once | INTRAMUSCULAR | Status: AC | PRN
  Administered 2023-09-19: 75 mL via INTRAVENOUS

## 2023-09-20 ENCOUNTER — Other Ambulatory Visit: Payer: Self-pay | Admitting: Internal Medicine

## 2023-09-20 ENCOUNTER — Encounter: Payer: Self-pay | Admitting: Internal Medicine

## 2023-09-20 ENCOUNTER — Ambulatory Visit: Payer: Medicaid Other | Admitting: Internal Medicine

## 2023-09-20 VITALS — BP 121/66 | HR 95 | Temp 98.4°F | Ht 72.0 in | Wt 171.0 lb

## 2023-09-20 DIAGNOSIS — G893 Neoplasm related pain (acute) (chronic): Secondary | ICD-10-CM | POA: Diagnosis present

## 2023-09-20 MED ORDER — OXYCODONE-ACETAMINOPHEN 5-325 MG PO TABS: 1.0000 | ORAL_TABLET | Freq: Four times a day (QID) | ORAL | 0 refills | Status: DC | PRN

## 2023-09-20 MED ORDER — PANTOPRAZOLE SODIUM 40 MG PO TBEC: 40.0000 mg | DELAYED_RELEASE_TABLET | Freq: Every day | ORAL | 0 refills | Status: DC

## 2023-09-20 NOTE — Patient Instructions (Signed)
Thank you, Mr.Tavarius L Dockter for allowing [place] to provide your care today. Today we discussed:  Chest pain/cancer pain: I have prescribed percocet for you to take up to every 6 hours, as needed, for severe pain If you are not having pain, you do not need to take this medication Please be careful and note that this medication can make you sleepy and is a controlled substance, so be sure that it does not get it the hands of anyone else Do not drink alcohol while using this medication   I have ordered the following labs for you:  Lab Orders  No laboratory test(s) ordered today     Referrals ordered today:   Referral Orders  No referral(s) requested today     I have ordered the following medication/changed the following medications:   Stop the following medications: Medications Discontinued During This Encounter  Medication Reason   pantoprazole (PROTONIX) 40 MG tablet    pantoprazole (PROTONIX) 40 MG tablet    oxyCODONE-acetaminophen (PERCOCET) 5-325 MG tablet Reorder     Start the following medications: Meds ordered this encounter  Medications   pantoprazole (PROTONIX) 40 MG tablet    Sig: Take 1 tablet (40 mg total) by mouth daily.    Dispense:  30 tablet    Refill:  0   oxyCODONE-acetaminophen (PERCOCET) 5-325 MG tablet    Sig: Take 1 tablet by mouth every 6 (six) hours as needed.    Dispense:  28 tablet    Refill:  0     Follow up:  ~ 2 months     Should you have any questions or concerns please call the internal medicine clinic at [phone].     Elza Rafter, D.O. [facility]

## 2023-09-20 NOTE — Progress Notes (Signed)
CC: ED follow up  HPI:  Todd Estrada is a 62 y.o. male living with a history stated below and presents today for an ED follow up after being seen for right sided chest pain. Please see problem based assessment and plan for additional details.  Past Medical History:  Diagnosis Date   Asthma    Atypical chest pain 08/13/2022   Community acquired pneumonia 09/14/2022   COPD (chronic obstructive pulmonary disease) (HCC)    Essential hypertension 08/19/2021   GERD (gastroesophageal reflux disease)    HAP (hospital-acquired pneumonia) 09/16/2022   History of tracheostomy    03/09/22-04/11/22   HLD (hyperlipidemia)    Hypertension    Hypokalemia 08/13/2022   Hypomagnesemia 08/13/2022   Lung cancer (HCC)    PAD (peripheral artery disease) (HCC)    Seizures (HCC) 06/02/2022   Sepsis (HCC) 08/13/2022   Sepsis due to pneumonia (HCC) 04/13/2023   Stroke (HCC) 02/2022    Current Outpatient Medications on File Prior to Visit  Medication Sig Dispense Refill   albuterol (PROVENTIL) (2.5 MG/3ML) 0.083% nebulizer solution Take 3 mLs (2.5 mg total) by nebulization every 4 (four) hours as needed for wheezing or shortness of breath. 90 mL 12   apixaban (ELIQUIS) 5 MG TABS tablet TAKE 1 TABLET(5 MG) BY MOUTH TWICE DAILY (Patient taking differently: Take 5 mg by mouth 2 (two) times daily.) 180 tablet 2   atorvastatin (LIPITOR) 40 MG tablet Take 1 tablet (40 mg total) by mouth daily. 90 tablet 3   budesonide (PULMICORT) 0.5 MG/2ML nebulizer solution NEW PRESCRIPTION REQEUST: BUDESONIDE 0.5 MG/ - USE ONE VIAL TWICE DAILY (Patient not taking: Reported on 08/19/2023) 180 mL 3   levETIRAcetam (KEPPRA) 500 MG tablet Take 1 tablet (500 mg total) by mouth 2 (two) times daily. 180 tablet 3   midodrine (PROAMATINE) 2.5 MG tablet Take 2.5 mg by mouth 2 (two) times daily with a meal.     Multiple Vitamin (MULTIVITAMIN WITH MINERALS) TABS tablet Take 1 tablet by mouth daily.     sucralfate (CARAFATE) 1  GM/10ML suspension Take 10 mLs (1 g total) by mouth 4 (four) times daily -  with meals and at bedtime. 420 mL 0   Tiotropium Bromide-Olodaterol (STIOLTO RESPIMAT) 2.5-2.5 MCG/ACT AERS NEW PRESCRIPTION REQUEST: STIOLTO 2.5 MCG- INHALE TWO PUFFS BY MOUTH DAILY (Patient taking differently: Inhale 2 puffs into the lungs daily.) 12 g 3   No current facility-administered medications on file prior to visit.    Family History  Problem Relation Age of Onset   Throat cancer Mother    Liver cancer Father    Kidney failure Sister    Cancer - Lung Paternal Uncle     Social History   Socioeconomic History   Marital status: Widowed    Spouse name: Not on file   Number of children: Not on file   Years of education: Not on file   Highest education level: Not on file  Occupational History   Not on file  Tobacco Use   Smoking status: Former    Current packs/day: 0.00    Types: Cigarettes    Quit date: 05/02/2022    Years since quitting: 1.3    Passive exposure: Never   Smokeless tobacco: Never  Vaping Use   Vaping status: Never Used  Substance and Sexual Activity   Alcohol use: Not Currently   Drug use: No   Sexual activity: Not Currently  Other Topics Concern   Not on file  Social History  Narrative   Not on file   Social Determinants of Health   Financial Resource Strain: Not on file  Food Insecurity: No Food Insecurity (07/05/2023)   Hunger Vital Sign    Worried About Running Out of Food in the Last Year: Never true    Ran Out of Food in the Last Year: Never true  Transportation Needs: No Transportation Needs (07/05/2023)   PRAPARE - Administrator, Civil Service (Medical): No    Lack of Transportation (Non-Medical): No  Physical Activity: Not on file  Stress: Not on file  Social Connections: Socially Isolated (01/06/2023)   Social Connection and Isolation Panel [NHANES]    Frequency of Communication with Friends and Family: Once a week    Frequency of Social  Gatherings with Friends and Family: Once a week    Attends Religious Services: Never    Database administrator or Organizations: No    Attends Banker Meetings: Never    Marital Status: Widowed  Intimate Partner Violence: Not At Risk (07/05/2023)   Humiliation, Afraid, Rape, and Kick questionnaire    Fear of Current or Ex-Partner: No    Emotionally Abused: No    Physically Abused: No    Sexually Abused: No    Review of Systems: ROS negative except for what is noted on the assessment and plan.  Vitals:   09/20/23 1338  BP: 121/66  Pulse: 95  Temp: 98.4 F (36.9 C)  TempSrc: Oral  SpO2: 97%  Weight: 171 lb (77.6 kg)  Height: 6' (1.829 m)    Physical Exam: Constitutional: chronically ill appearing male sitting in wheelchair Cardiovascular: regular rate and rhythm, no m/r/g Pulmonary/Chest: normal work of breathing on room air, right chest wall tenderness  MSK: normal bulk and tone Neurological: alert, mostly aphasic (baseline from previous stroke) but can answer simple yes/no questions, R sided weakness (baseline) no new focal deficits Skin: warm and dry Psych: normal mood and behavior  Assessment & Plan:      Patient discussed with Dr. Lafonda Mosses  Cancer related pain The patient presents as an ED follow up after being seen for right sided chest wall pain that has been present for weeks. The patient has a history of lung cancer and has undergone palliative radiation therapy for this. In the ED ACS was ruled out and PE was ruled out as well. CT scan did show a new 2.2 cm pulmonary nodule within the subpleural right lower lobe and stable radiation changes in the left upper lobe and right upper lobe. He was given a short course of pain medications at discharge, which significantly helped his pain. He reports not having to take the pain medications everyday, but rather, only took them when his pain was severe.   Today, the patient (and his cousin who provides most of  the history) state that he continues to have this right sided chest wall pain. We discussed how the pain is likely related to his cancer and he understands. We also discussed another course of percocet for his pain, and how he should talk to his oncologist about his cancer-related pain, as he may benefit from chronic opioid therapy. If the patient's oncologist is not comfortable doing this, we can see the patient back and have him signed a controlled substance agreement at his next office visit. We reviewed the risks of opioid medications and how the patient should keep them safe/away from others. He understands.   Plan: - Percocet 5-325 mg q6h  PRN (sent in 28 tablets and counseled patient he does not have to take it daily if he does not have pain) - Discuss chronic cancer related pain with oncologist - Follow up and sign controlled substance agreement at next office visit if oncologist does not prescribe him pain medications   Ventura Leggitt, D.O. [facility], PGY-3 Phone: [phone] Date 09/20/2023 Time 2:48 PM

## 2023-09-20 NOTE — Assessment & Plan Note (Addendum)
The patient presents as an ED follow up after being seen for right sided chest wall pain that has been present for weeks. The patient has a history of lung cancer and has undergone palliative radiation therapy for this. In the ED ACS was ruled out and PE was ruled out as well. CT scan did show a new 2.2 cm pulmonary nodule within the subpleural right lower lobe and stable radiation changes in the left upper lobe and right upper lobe. He was given a short course of pain medications at discharge, which significantly helped his pain. He reports not having to take the pain medications everyday, but rather, only took them when his pain was severe.   Today, the patient (and his cousin who provides most of the history) state that he continues to have this right sided chest wall pain. We discussed how the pain is likely related to his cancer and he understands. We also discussed another course of percocet for his pain, and how he should talk to his oncologist about his cancer-related pain, as he may benefit from chronic opioid therapy. If the patient's oncologist is not comfortable doing this, we can see the patient back and have him signed a controlled substance agreement at his next office visit. We reviewed the risks of opioid medications and how the patient should keep them safe/away from others. He understands.   Plan: - Percocet 5-325 mg q6h PRN (sent in 28 tablets and counseled patient he does not have to take it daily if he does not have pain) - Discuss chronic cancer related pain with oncologist - Follow up and sign controlled substance agreement at next office visit if oncologist does not prescribe him pain medications

## 2023-09-21 NOTE — Progress Notes (Signed)
Internal Medicine Clinic Attending  Case discussed with the resident at the time of the visit.  We reviewed the resident's history and exam and pertinent patient test results.  I agree with the assessment, diagnosis, and plan of care documented in the resident's note.  

## 2023-09-26 ENCOUNTER — Telehealth: Payer: Self-pay | Admitting: Internal Medicine

## 2023-09-26 NOTE — Telephone Encounter (Signed)
Scheduled October appointment per scheduling message, called and left a voicemail.

## 2023-09-28 ENCOUNTER — Telehealth: Payer: Self-pay | Admitting: Medical Oncology

## 2023-09-28 ENCOUNTER — Inpatient Hospital Stay: Payer: Medicaid Other | Attending: Internal Medicine | Admitting: Internal Medicine

## 2023-09-28 NOTE — Telephone Encounter (Signed)
LVM to return my call re appt.

## 2023-10-03 ENCOUNTER — Encounter: Payer: Self-pay | Admitting: Radiation Oncology

## 2023-10-03 NOTE — Progress Notes (Signed)
Dr. Mitzi Hansen has reviewed the patient's imaging and the previously noted are of concern in the RUL lung has resolved. We will follow along with additional imaging and be happy to see the patient as needed in the future.

## 2023-10-04 ENCOUNTER — Other Ambulatory Visit: Payer: Self-pay

## 2023-10-04 ENCOUNTER — Emergency Department (HOSPITAL_COMMUNITY)
Admission: EM | Admit: 2023-10-04 | Discharge: 2023-10-04 | Disposition: A | Discharge status: Home / Self Care | Payer: Medicaid Other | Attending: Emergency Medicine | Admitting: Emergency Medicine

## 2023-10-04 ENCOUNTER — Emergency Department (HOSPITAL_COMMUNITY): Payer: Medicaid Other

## 2023-10-04 ENCOUNTER — Encounter (HOSPITAL_COMMUNITY): Payer: Self-pay | Admitting: Radiology

## 2023-10-04 DIAGNOSIS — I110 Hypertensive heart disease with heart failure: Secondary | ICD-10-CM | POA: Diagnosis not present

## 2023-10-04 DIAGNOSIS — Z87891 Personal history of nicotine dependence: Secondary | ICD-10-CM | POA: Diagnosis not present

## 2023-10-04 DIAGNOSIS — Z85118 Personal history of other malignant neoplasm of bronchus and lung: Secondary | ICD-10-CM | POA: Insufficient documentation

## 2023-10-04 DIAGNOSIS — K219 Gastro-esophageal reflux disease without esophagitis: Secondary | ICD-10-CM | POA: Insufficient documentation

## 2023-10-04 DIAGNOSIS — I509 Heart failure, unspecified: Secondary | ICD-10-CM | POA: Insufficient documentation

## 2023-10-04 DIAGNOSIS — Z7951 Long term (current) use of inhaled steroids: Secondary | ICD-10-CM | POA: Diagnosis not present

## 2023-10-04 DIAGNOSIS — R1084 Generalized abdominal pain: Secondary | ICD-10-CM | POA: Diagnosis not present

## 2023-10-04 DIAGNOSIS — J449 Chronic obstructive pulmonary disease, unspecified: Secondary | ICD-10-CM | POA: Diagnosis not present

## 2023-10-04 DIAGNOSIS — R109 Unspecified abdominal pain: Secondary | ICD-10-CM | POA: Diagnosis present

## 2023-10-04 DIAGNOSIS — Z7901 Long term (current) use of anticoagulants: Secondary | ICD-10-CM | POA: Diagnosis not present

## 2023-10-04 LAB — CBC WITH DIFFERENTIAL/PLATELET
Abs Immature Granulocytes: 0.01 10*3/uL (ref 0.00–0.07)
Basophils Absolute: 0 10*3/uL (ref 0.0–0.1)
Basophils Relative: 0 %
Eosinophils Absolute: 0.1 10*3/uL (ref 0.0–0.5)
Eosinophils Relative: 2 %
HCT: 38.5 % — ABNORMAL LOW (ref 39.0–52.0)
Hemoglobin: 12.2 g/dL — ABNORMAL LOW (ref 13.0–17.0)
Immature Granulocytes: 0 %
Lymphocytes Relative: 17 %
Lymphs Abs: 0.8 10*3/uL (ref 0.7–4.0)
MCH: 29.7 pg (ref 26.0–34.0)
MCHC: 31.7 g/dL (ref 30.0–36.0)
MCV: 93.7 fL (ref 80.0–100.0)
Monocytes Absolute: 0.4 10*3/uL (ref 0.1–1.0)
Monocytes Relative: 8 %
Neutro Abs: 3.5 10*3/uL (ref 1.7–7.7)
Neutrophils Relative %: 73 %
Platelets: 142 10*3/uL — ABNORMAL LOW (ref 150–400)
RBC: 4.11 MIL/uL — ABNORMAL LOW (ref 4.22–5.81)
RDW: 13.4 % (ref 11.5–15.5)
WBC: 4.8 10*3/uL (ref 4.0–10.5)
nRBC: 0 % (ref 0.0–0.2)

## 2023-10-04 LAB — COMPREHENSIVE METABOLIC PANEL
ALT: 14 U/L (ref 0–44)
AST: 17 U/L (ref 15–41)
Albumin: 3.8 g/dL (ref 3.5–5.0)
Alkaline Phosphatase: 91 U/L (ref 38–126)
Anion gap: 8 (ref 5–15)
BUN: 15 mg/dL (ref 8–23)
CO2: 30 mmol/L (ref 22–32)
Calcium: 9 mg/dL (ref 8.9–10.3)
Chloride: 102 mmol/L (ref 98–111)
Creatinine, Ser: 0.63 mg/dL (ref 0.61–1.24)
GFR, Estimated: >60 mL/min (ref >60)
Glucose, Bld: 97 mg/dL (ref 70–99)
Potassium: 4.2 mmol/L (ref 3.5–5.1)
Sodium: 140 mmol/L (ref 135–145)
Total Bilirubin: 0.7 mg/dL (ref 0.3–1.2)
Total Protein: 7.2 g/dL (ref 6.5–8.1)

## 2023-10-04 LAB — URINALYSIS, ROUTINE W REFLEX MICROSCOPIC
Bilirubin Urine: NEGATIVE
Glucose, UA: NEGATIVE mg/dL
Hgb urine dipstick: NEGATIVE
Ketones, ur: NEGATIVE mg/dL
Leukocytes,Ua: NEGATIVE
Nitrite: NEGATIVE
Protein, ur: NEGATIVE mg/dL
Specific Gravity, Urine: 1.018 (ref 1.005–1.030)
pH: 6 (ref 5.0–8.0)

## 2023-10-04 LAB — LIPASE, BLOOD: Lipase: 28 U/L (ref 11–51)

## 2023-10-04 MED ORDER — IOHEXOL 300 MG/ML  SOLN
100.0000 mL | Freq: Once | INTRAMUSCULAR | Status: AC | PRN
  Administered 2023-10-04: 100 mL via INTRAVENOUS

## 2023-10-04 MED ORDER — SODIUM CHLORIDE (PF) 0.9 % IJ SOLN
INTRAMUSCULAR | Status: AC
  Filled 2023-10-04: qty 50

## 2023-10-04 NOTE — ED Provider Notes (Signed)
[place] EMERGENCY DEPARTMENT AT [facility] Provider Note  CSN: [id number] Arrival date & time: 10/04/23 1402  Chief Complaint(s) Abdominal Pain  HPI Todd Estrada STATES is a 62 y.o. male here today for abdominal pain.  He has not a bowel movement for few days.  He has not been vomiting, has not had any diarrhea.  Patient has a past medical history significant for lung cancer.  Not currently receiving chemotherapy.  Patient has had prior CVA with aphasia.   Past Medical History Past Medical History:  Diagnosis Date   Asthma    Atypical chest pain 08/13/2022   Community acquired pneumonia 09/14/2022   COPD (chronic obstructive pulmonary disease) (HCC)    Essential hypertension 08/19/2021   GERD (gastroesophageal reflux disease)    HAP (hospital-acquired pneumonia) 09/16/2022   History of tracheostomy    03/09/22-04/11/22   HLD (hyperlipidemia)    Hypertension    Hypokalemia 08/13/2022   Hypomagnesemia 08/13/2022   Lung cancer (HCC)    PAD (peripheral artery disease) (HCC)    Seizures (HCC) 06/02/2022   Sepsis (HCC) 08/13/2022   Sepsis due to pneumonia (HCC) 04/13/2023   Stroke (HCC) 02/2022   Patient Active Problem List   Diagnosis Date Noted   Cancer related pain 09/20/2023   Intra-abdominal abscess (HCC) 07/25/2023   Intra-abdominal fluid collection 07/20/2023   AKI (acute kidney injury) (HCC) 07/20/2023   Sepsis (HCC) 07/20/2023   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 07/07/2023   Acute gangrenous cholecystitis 07/04/2023   Right upper lobe pneumonia 04/13/2023   Buttock wound, right, subsequent encounter 03/01/2023   PAD (peripheral artery disease) (HCC) 01/06/2023   Chronic hypotension 01/06/2023   History of pulmonary embolism 10/12/2022   Cervical transverse process fracture (HCC) 09/10/2022   Frequent falls 08/27/2022   Aphasia as late effect of cerebrovascular accident 08/16/2022   Septic shock (HCC) 08/13/2022   Anemia of chronic disease 08/13/2022    Hypomagnesemia 08/13/2022   Chronic heart failure with mildly reduced ejection fraction (HFmrEF) (HCC) 08/13/2022   Necrotizing pneumonia (HCC) 08/12/2022   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 Authorizing Provider  albuterol (PROVENTIL) (2.5 MG/3ML) 0.083% nebulizer solution Take 3 mLs (2.5 mg total) by nebulization every 4 (four) hours as needed for wheezing or shortness of breath. 08/16/22   Steffanie Rainwater, MD  apixaban (ELIQUIS) 5 MG TABS tablet TAKE 1 TABLET(5 MG) BY MOUTH TWICE DAILY Patient taking differently: Take 5 mg by mouth 2 (two) times daily. 04/11/23   Briscoe Burns, MD  atorvastatin (LIPITOR) 40 MG tablet Take 1 tablet (40 mg total) by mouth daily. 01/03/23 01/03/24  Briscoe Burns, MD  budesonide (PULMICORT) 0.5 MG/2ML nebulizer solution NEW PRESCRIPTION REQEUST: BUDESONIDE 0.5 MG/ - USE ONE VIAL TWICE DAILY Patient not taking: Reported on 08/19/2023 11/29/22   Martina Sinner, MD  levETIRAcetam (KEPPRA) 500 MG tablet Take 1 tablet (500 mg total) by mouth 2 (two) times daily. 12/07/22   Ihor Austin, NP  midodrine (PROAMATINE) 2.5 MG tablet Take 2.5 mg by mouth 2 (two) times daily with a meal.    [provider]  Multiple Vitamin (MULTIVITAMIN WITH MINERALS) TABS tablet Take 1 tablet by mouth daily.    [provider]  oxyCODONE-acetaminophen (PERCOCET) 5-325 MG tablet Take 1 tablet by mouth every 6  (six) hours as needed. 09/20/23   Atway, Rayann N, DO  pantoprazole (PROTONIX) 40 MG tablet Take 1 tablet (40 mg total) by mouth daily. 09/20/23 10/20/23  Atway, Rayann N, DO  sucralfate (CARAFATE) 1 GM/10ML suspension Take 10 mLs (1 g total) by mouth 4 (four) times daily -  with meals and at bedtime. 07/02/23   Nira Conn, MD  Tiotropium Bromide-Olodaterol (STIOLTO RESPIMAT) 2.5-2.5 MCG/ACT AERS NEW PRESCRIPTION REQUEST: STIOLTO 2.5 MCG- INHALE TWO PUFFS BY MOUTH DAILY Patient taking differently: Inhale 2 puffs into the lungs daily. 11/29/22   Martina Sinner, MD                                                                                                                                    Past Surgical History Past Surgical History:  Procedure Laterality Date   BRONCHIAL BIOPSY  05/30/2022   Procedure: BRONCHIAL BIOPSIES;  Surgeon: Leslye Peer, MD;  Location: [facility] ENDOSCOPY;  Service: Pulmonary;;   BRONCHIAL BRUSHINGS  05/30/2022   Procedure: BRONCHIAL BRUSHINGS;  Surgeon: Leslye Peer, MD;  Location: [facility] ENDOSCOPY;  Service: Pulmonary;;   BRONCHIAL NEEDLE ASPIRATION BIOPSY  05/30/2022   Procedure: BRONCHIAL NEEDLE ASPIRATION BIOPSIES;  Surgeon: Leslye Peer, MD;  Location: [facility] ENDOSCOPY;  Service: Pulmonary;;   BRONCHIAL WASHINGS  05/30/2022   Procedure: BRONCHIAL WASHINGS;  Surgeon: Leslye Peer, MD;  Location: [facility] ENDOSCOPY;  Service: Pulmonary;;   CHOLECYSTECTOMY N/A 07/05/2023   Procedure: LAPAROSCOPIC CHOLECYSTECTOMY WITH INTRAOPERATIVE CHOLANGIOGRAM;  Surgeon: Karie Soda, MD;  Location: [facility] ORS;  Service: General;  Laterality: N/A;   ERCP N/A 07/06/2023   Procedure: ENDOSCOPIC RETROGRADE CHOLANGIOPANCREATOGRAPHY (ERCP);  Surgeon: Vida Rigger, MD;  Location: [facility] ENDOSCOPY;  Service: Gastroenterology;  Laterality: N/A;   ESOPHAGOGASTRODUODENOSCOPY (EGD) WITH PROPOFOL N/A 03/11/2022   Procedure: ESOPHAGOGASTRODUODENOSCOPY (EGD) WITH PROPOFOL;  Surgeon: Violeta Gelinas, MD;   Location: [facility] ENDOSCOPY;  Service: General;  Laterality: N/A;   FIDUCIAL MARKER PLACEMENT  05/30/2022   Procedure: FIDUCIAL MARKER PLACEMENT;  Surgeon: Leslye Peer, MD;  Location: [facility] ENDOSCOPY;  Service: Pulmonary;;   IR ANGIO INTRA EXTRACRAN SEL COM CAROTID INNOMINATE UNI R MOD SED  03/02/2022   IR CT HEAD LTD  03/02/2022   IR PERCUTANEOUS ART THROMBECTOMY/INFUSION INTRACRANIAL INC DIAG ANGIO  03/02/2022   PEG PLACEMENT N/A 03/11/2022   Procedure: PERCUTANEOUS ENDOSCOPIC GASTROSTOMY (PEG) PLACEMENT;  Surgeon: Violeta Gelinas, MD;  Location: [facility] ENDOSCOPY;  Service: General;  Laterality: N/A;   RADIOLOGY WITH ANESTHESIA N/A 03/02/2022   Procedure: IR WITH ANESTHESIA;  Surgeon: Julieanne Cotton, MD;  Location: [facility] OR;  Service: Radiology;  Laterality: N/A;   REMOVAL OF STONES  07/06/2023   Procedure: REMOVAL OF STONES;  Surgeon: Vida Rigger, MD;  Location: [facility] ENDOSCOPY;  Service: Gastroenterology;;   Dennison Mascot  07/06/2023   Procedure: Dennison Mascot;  Surgeon: Vida Rigger, MD;  Location: [facility] ENDOSCOPY;  Service: Gastroenterology;;   VIDEO BRONCHOSCOPY WITH RADIAL ENDOBRONCHIAL ULTRASOUND  05/30/2022   Procedure: VIDEO BRONCHOSCOPY WITH RADIAL ENDOBRONCHIAL ULTRASOUND;  Surgeon: Leslye Peer, MD;  Location: [facility] ENDOSCOPY;  Service: Pulmonary;;   Family History Family History  Problem Relation Age of Onset   Throat cancer Mother    Liver cancer Father    Kidney failure Sister    Cancer - Lung Paternal Uncle     Social History Social History   Tobacco Use   Smoking status: Former    Current packs/day: 0.00    Types: Cigarettes    Quit date: 05/02/2022    Years since quitting: 1.4    Passive exposure: Never   Smokeless tobacco: Never  Vaping Use   Vaping status: Never Used  Substance Use Topics   Alcohol use: Not Currently   Drug use: No   Allergies Patient has no known allergies.  Review of Systems Review of Systems  Physical Exam Vital Signs  I have reviewed the triage  vital signs BP 132/83 (BP Location: Left Arm)   Pulse 80   Temp 98.5 F (36.9 C) (Oral)   Resp 18   SpO2 100%   Physical Exam Vitals reviewed.  Abdominal:     Palpations: Abdomen is soft.     Tenderness: There is abdominal tenderness in the suprapubic area.  Neurological:     Mental Status: He is alert.     Comments: Speech production difficulties     ED Results and Treatments Labs (all labs ordered are listed, but only abnormal results are displayed) Labs Reviewed  CBC WITH DIFFERENTIAL/PLATELET - Abnormal; Notable for the following components:      Result Value   RBC 4.11 (*)    Hemoglobin 12.2 (*)    HCT 38.5 (*)    Platelets 142 (*)    All other components within normal limits  COMPREHENSIVE METABOLIC PANEL  LIPASE, BLOOD  URINALYSIS, ROUTINE W REFLEX MICROSCOPIC                                                                                                                          Radiology No results found.  Pertinent labs & imaging results that were available during my care of the patient were reviewed by me and considered in my medical decision making (see MDM for details).  Medications Ordered in ED Medications - No data to display  Procedures Procedures    Medical Decision Making / ED Course   This patient presents to the ED for concern of abdominal pain, this involves an extensive number of treatment options, and is a complaint that carries with it a high risk of complications and morbidity.  The differential diagnosis includes metastatic cancer, constipation, intra-abdominal infection, obstruction  MDM: 62 year old male here today with abdominal pain.  Will obtain imaging of the patient's abdomen pelvis.  Urinalysis pending.  Overall patient's abdomen is soft.  With his prior cancer history, do have some concerns for  recurrent disease, although the patient did have CT imaging done on the 22nd of this past month.  At that time, patient was constipated.  May be experiencing continued symptoms.   Additional history obtained:  -External records from outside source obtained and reviewed including: Chart review including previous notes, labs, imaging, consultation notes   Lab Tests: -I ordered, reviewed, and interpreted labs.   The pertinent results include:   Labs Reviewed  CBC WITH DIFFERENTIAL/PLATELET - Abnormal; Notable for the following components:      Result Value   RBC 4.11 (*)    Hemoglobin 12.2 (*)    HCT 38.5 (*)    Platelets 142 (*)    All other components within normal limits  COMPREHENSIVE METABOLIC PANEL  LIPASE, BLOOD  URINALYSIS, ROUTINE W REFLEX MICROSCOPIC      EKG   EKG Interpretation Date/Time:    Ventricular Rate:    PR Interval:    QRS Duration:    QT Interval:    QTC Calculation:   R Axis:      Text Interpretation:           Imaging Studies ordered: I ordered imaging studies including CT imaging the abdomen and pelvis I independently visualized and interpreted imaging. I agree with the radiologist interpretation   Medicines ordered and prescription drug management: No orders of the defined types were placed in this encounter.   -I have reviewed the patients home medicines and have made adjustments as needed    Cardiac Monitoring: The patient was maintained on a cardiac monitor.  I personally viewed and interpreted the cardiac monitored which showed an underlying rhythm of: This rhythm   Reevaluation: After the interventions noted above, I reevaluated the patient and found that they have :stayed the same  Co morbidities that complicate the patient evaluation  Past Medical History:  Diagnosis Date   Asthma    Atypical chest pain 08/13/2022   Community acquired pneumonia 09/14/2022   COPD (chronic obstructive pulmonary disease) (HCC)     Essential hypertension 08/19/2021   GERD (gastroesophageal reflux disease)    HAP (hospital-acquired pneumonia) 09/16/2022   History of tracheostomy    03/09/22-04/11/22   HLD (hyperlipidemia)    Hypertension    Hypokalemia 08/13/2022   Hypomagnesemia 08/13/2022   Lung cancer (HCC)    PAD (peripheral artery disease) (HCC)    Seizures (HCC) 06/02/2022   Sepsis (HCC) 08/13/2022   Sepsis due to pneumonia (HCC) 04/13/2023   Stroke (HCC) 02/2022      Dispostion: Signed out to Dr. Criss Alvine     Final Clinical Impression(s) / ED Diagnoses Final diagnoses:  Generalized abdominal pain     @PCDICTATION @    Anders Simmonds T, DO 10/04/23 1625

## 2023-10-04 NOTE — ED Notes (Addendum)
7:18 PM  Report received from previous RN. This RN assumes care of the patient.   7:43 PM  Patient being seen for generalized abdominal pains that progressively worsened since this morning. Patient has not had a BM in several days. Denies vomiting, but has been intermittently nauseous. Denies urinary sx, fevers, chills, CP, SOB, dizziness, or lightheadedness. Patient has a hx of stroke that has caused residual right sided weakness and expressive aphasia. Patient is alert and able to answer yes and no questions with clear vocalization. Patient speech is dysarthric and difficult to understand. Pending CT results. Patient denies any further needs at this time. He is resting comfortably in stretcher watching tv. Call light in reach. Bed in lowest position.   9:05 PM  Discharge instructions discussed with patient and caregiver, Livan Hires. Patient and family voice understanding of discharge instructions and denies any questions, needs, or concerns regarding discharge. Patient is stable at discharge. Patient's cousin, Carmel Waddington to transport patient home.

## 2023-10-04 NOTE — Discharge Instructions (Signed)
If you develop worsening, continued, or recurrent abdominal pain, uncontrolled vomiting, fever, chest or back pain, or any other new/concerning symptoms then return to the ER for evaluation.

## 2023-10-04 NOTE — ED Triage Notes (Signed)
Pt BIBA from home with c/o abdominal pain since this morning. Can answer yes or no questions. Past hx of stroke.   140/[id number]

## 2023-10-04 NOTE — ED Provider Notes (Signed)
Patient care transferred to me.  CT unremarkable.  Labs reassuring compared to baseline.  Urinalysis is negative.  Appears stable for discharge with return precaution.  Normal vitals.        Pricilla Loveless, MD 10/04/23 2040

## 2023-10-06 ENCOUNTER — Telehealth: Payer: Self-pay | Admitting: Internal Medicine

## 2023-10-06 NOTE — Telephone Encounter (Signed)
Scheduled per 10/15 scheduling message, called and spoke with patient's caregiver. Patient will be notified.

## 2023-10-11 ENCOUNTER — Emergency Department (HOSPITAL_COMMUNITY)
Admission: EM | Admit: 2023-10-11 | Discharge: 2023-10-11 | Disposition: A | Discharge status: Home / Self Care | Payer: Medicaid Other | Attending: Emergency Medicine | Admitting: Emergency Medicine

## 2023-10-11 DIAGNOSIS — Z86711 Personal history of pulmonary embolism: Secondary | ICD-10-CM | POA: Diagnosis not present

## 2023-10-11 DIAGNOSIS — Z7901 Long term (current) use of anticoagulants: Secondary | ICD-10-CM | POA: Diagnosis not present

## 2023-10-11 DIAGNOSIS — R079 Chest pain, unspecified: Secondary | ICD-10-CM | POA: Insufficient documentation

## 2023-10-11 LAB — CBC
HCT: 38.6 % — ABNORMAL LOW (ref 39.0–52.0)
Hemoglobin: 12.3 g/dL — ABNORMAL LOW (ref 13.0–17.0)
MCH: 30 pg (ref 26.0–34.0)
MCHC: 31.9 g/dL (ref 30.0–36.0)
MCV: 94.1 fL (ref 80.0–100.0)
Platelets: 159 10*3/uL (ref 150–400)
RBC: 4.1 MIL/uL — ABNORMAL LOW (ref 4.22–5.81)
RDW: 13.6 % (ref 11.5–15.5)
WBC: 5.5 10*3/uL (ref 4.0–10.5)
nRBC: 0 % (ref 0.0–0.2)

## 2023-10-11 LAB — BASIC METABOLIC PANEL
Anion gap: 8 (ref 5–15)
BUN: 15 mg/dL (ref 8–23)
CO2: 29 mmol/L (ref 22–32)
Calcium: 9 mg/dL (ref 8.9–10.3)
Chloride: 104 mmol/L (ref 98–111)
Creatinine, Ser: 0.76 mg/dL (ref 0.61–1.24)
GFR, Estimated: >60 mL/min (ref >60)
Glucose, Bld: 93 mg/dL (ref 70–99)
Potassium: 3.9 mmol/L (ref 3.5–5.1)
Sodium: 141 mmol/L (ref 135–145)

## 2023-10-11 LAB — TROPONIN I (HIGH SENSITIVITY): Troponin I (High Sensitivity): 4 ng/L (ref <18)

## 2023-10-11 NOTE — ED Provider Notes (Signed)
[place] EMERGENCY DEPARTMENT AT [facility] Provider Note   CSN: [id number] Arrival date & time: 10/11/23  1553     History  Chief Complaint  Patient presents with   Chest Pain    Todd Estrada is a 62 y.o. male presenting to the ED with chest pain.  The patient suffers and chronic chest pain has been evaluated numerous times in the emergency department for similar symptoms.  He is difficult to understand due to speech impediment but is able to answer appropriately yes and no to questions.  He reports he is having chest pain that he feels is similar to his prior chest pain.  It is not clear why he opted to come in today but EMS reports that a caretaker was concerned the patient was "clutching his chest".  The patient tells me his pain feels about the same.  He denies any trauma to the chest.  The patient does have a history of PE for which he is on Eliquis as well as radiation therapy to the lungs.  He has had numerous CT scans of the chest through the emergency department.  Most recently had a CT scan at the start of this month, October 1, with contrast, showing stable findings at that time.  HPI     Home Medications Prior to Admission medications   Medication Sig Start Date End Date Taking? Authorizing Provider  albuterol (PROVENTIL) (2.5 MG/3ML) 0.083% nebulizer solution Take 3 mLs (2.5 mg total) by nebulization every 4 (four) hours as needed for wheezing or shortness of breath. 08/16/22   Steffanie Rainwater, MD  apixaban (ELIQUIS) 5 MG TABS tablet TAKE 1 TABLET(5 MG) BY MOUTH TWICE DAILY Patient taking differently: Take 5 mg by mouth 2 (two) times daily. 04/11/23   Briscoe Burns, MD  atorvastatin (LIPITOR) 40 MG tablet Take 1 tablet (40 mg total) by mouth daily. 01/03/23 01/03/24  Briscoe Burns, MD  budesonide (PULMICORT) 0.5 MG/2ML nebulizer solution NEW PRESCRIPTION REQEUST: BUDESONIDE 0.5 MG/ - USE ONE VIAL TWICE DAILY Patient not taking: Reported on  08/19/2023 11/29/22   Martina Sinner, MD  levETIRAcetam (KEPPRA) 500 MG tablet Take 1 tablet (500 mg total) by mouth 2 (two) times daily. 12/07/22   Ihor Austin, NP  midodrine (PROAMATINE) 2.5 MG tablet Take 2.5 mg by mouth 2 (two) times daily with a meal.    [provider]  Multiple Vitamin (MULTIVITAMIN WITH MINERALS) TABS tablet Take 1 tablet by mouth daily.    [provider]  oxyCODONE-acetaminophen (PERCOCET) 5-325 MG tablet Take 1 tablet by mouth every 6 (six) hours as needed. 09/20/23   Atway, Rayann N, DO  pantoprazole (PROTONIX) 40 MG tablet Take 1 tablet (40 mg total) by mouth daily. 09/20/23 10/20/23  Atway, Rayann N, DO  sucralfate (CARAFATE) 1 GM/10ML suspension Take 10 mLs (1 g total) by mouth 4 (four) times daily -  with meals and at bedtime. 07/02/23   Nira Conn, MD  Tiotropium Bromide-Olodaterol (STIOLTO RESPIMAT) 2.5-2.5 MCG/ACT AERS NEW PRESCRIPTION REQUEST: STIOLTO 2.5 MCG- INHALE TWO PUFFS BY MOUTH DAILY Patient taking differently: Inhale 2 puffs into the lungs daily. 11/29/22   Martina Sinner, MD      Allergies    Patient has no known allergies.    Review of Systems   Review of Systems  Physical Exam Updated Vital Signs BP 120/78 (BP Location: Left Arm)   Pulse 77   Temp 99 F (37.2 C) (Oral)  Resp 14   SpO2 98%  Physical Exam Constitutional:      General: He is not in acute distress. HENT:     Head: Normocephalic and atraumatic.  Eyes:     Conjunctiva/sclera: Conjunctivae normal.     Pupils: Pupils are equal, round, and reactive to light.  Cardiovascular:     Rate and Rhythm: Normal rate and regular rhythm.  Pulmonary:     Effort: Pulmonary effort is normal. No respiratory distress.  Abdominal:     General: There is no distension.     Tenderness: There is no abdominal tenderness.  Skin:    General: Skin is warm and dry.  Neurological:     General: No focal deficit present.     Mental Status: He is alert. Mental  status is at baseline.  Psychiatric:        Mood and Affect: Mood normal.        Behavior: Behavior normal.     ED Results / Procedures / Treatments   Labs (all labs ordered are listed, but only abnormal results are displayed) Labs Reviewed  CBC - Abnormal; Notable for the following components:      Result Value   RBC 4.10 (*)    Hemoglobin 12.3 (*)    HCT 38.6 (*)    All other components within normal limits  BASIC METABOLIC PANEL  TROPONIN I (HIGH SENSITIVITY)    EKG EKG Interpretation Date/Time:  Wednesday October 11 2023 16:29:32 EDT Ventricular Rate:  66 PR Interval:  169 QRS Duration:  94 QT Interval:  380 QTC Calculation: 399 R Axis:   69  Text Interpretation: Sinus rhythm Confirmed by Alvester Chou [phone]) on 10/11/2023 4:36:58 PM  Radiology No results found.  Procedures Procedures    Medications Ordered in ED Medications - No data to display  ED Course/ Medical Decision Making/ A&P                                 Medical Decision Making Amount and/or Complexity of Data Reviewed Labs: ordered. ECG/medicine tests: ordered.   This patient presents to the Emergency Department with complaint of chest pain. This involves an extensive number of treatment options, and is a complaint that carries with it a high risk of complications and morbidity, given the patient's comorbidity, including hypertension, cardiovascular disease.The differential diagnosis includes ACS vs Pneumothorax vs Reflux/Gastritis vs MSK pain vs Pneumonia vs other.  There was report of "bed bugs" seen at patient's home but these are not visible on patient nor evident bite marks.  He seems unaware of this issue.  Contact precautions ordered here.  I felt PE was less likely given that the patient has no tachycardia, hypoxia, reports compliance with his medications.  I ordered, reviewed, and interpreted labs.    Pertinent results include no emergent findings.  Troponin is  negative   Additional history was obtained from EMS  The patient has had an extensive amount of radiation and imaging of the chest reveals recurring visits for chest pain.  At this time of a low suspicion for acute intrathoracic process does not believe he is requiring additional x-rays or CT scan.  Low suspicion for pneumonia or pneumothorax or PE or aortic dissection.  External records obtained and reviewed showing cardiology evaluation from September 2024 with report of atypical chest pain, noting patient has a history of non-small cell carcinoma status post radiation.  I personally reviewed the  patients ECG which showed sinus rhythm with no acute ischemic findings  After the interventions stated above, I reevaluated the patient and found that they were symptomatically unchanged  Based on the patient's clinical exam, vital signs, risk factors, and ED testing, I felt that the patient's overall risk of life-threatening emergency such as ACS, PE, sepsis, or infection was low.  At this time, I felt the patient's presentation was most clinically consistent with nonspecific chest pain, but explained to the patient that this evaluation was not a definitive diagnostic workup.  I discussed outpatient follow up with primary care provider, and provided specialist office number on the patient's discharge paper if a referral was deemed necessary.  Return precautions were discussed with the patient.  I felt the patient was clinically stable for discharge.         Final Clinical Impression(s) / ED Diagnoses Final diagnoses:  Chest pain, unspecified type    Rx / DC Orders ED Discharge Orders     None         Shadana Pry, Kermit Balo, MD 10/11/23 1816

## 2023-10-11 NOTE — Discharge Instructions (Signed)
Todd Estrada's EKG, blood tests, and workup were reassuring and did not show signs of a heart attack or another life threatening condition.  He has been evaluated in the past for chest pain episodes in the ER.  He can follow up with his primary care clinic or cardiologist for his ongoing chest pain.

## 2023-10-11 NOTE — ED Triage Notes (Signed)
Pt BIBA from home. Hard to understand at times, can say yes or no. Caregiver stated pt was holding the right side of his chest, not sure if pt fell or not. Caregiver stated they heard a "thud" and asked pt if he fell. Pt told caregiver no but told EMS yes. Pt suspected to have bedbugs. Vitals WNL

## 2023-10-16 NOTE — Progress Notes (Deleted)
[facility] OFFICE PROGRESS NOTE  Todd Fabian, MD [address] [place] [address]  DIAGNOSIS: Stage IIb (T2 a, N1, M0) non-small cell lung cancer, squamous cell carcinoma presented with left upper lobe lung mass in addition to left hilar adenopathy diagnosed in June 2023.  The patient also has synchronous stage Ia (T1c, N0, M0) non-small cell lung cancer, squamous cell carcinoma presented with right upper lobe lung nodule diagnosed in June 2023. PD-L1 expression was 20%.  PRIOR THERAPY: Concurrent chemoradiation with weekly carboplatin for AUC of 2 and paclitaxel 45 Mg/M2. First dose 07/04/2022. Status post post 5 cycles. Last cycle was given August 01, 2022 with partial response. The patient was lost to follow-up after that cycle.   CURRENT THERAPY: Observation   INTERVAL HISTORY: Todd Estrada 62 y.o. male returns to clinic today for follow-up visit.  The patient was last seen by Dr. Arbutus Ped on 06/19/2023.  He is accompanied by ***.  The patient has a history of lung cancer for which she underwent concurrent chemoradiation.  He was last seen in the clinic in July 2024 and his CT scan at that time showed some disease progression with some pleural-based right lung mass with questionable chest wall invasion and Dr. Arbutus Ped recommended referral to radiation oncology for palliative radiation to this area.  He did eventually establish with radiation oncology who arrange for a PET scan.  The findings were more consistent with postradiation inflammation.  Evaluated the patient.  They suspect that the area along the right lung was related to radiation change versus recurrence and they recommended post monitoring with repeat imaging studies.  Patient has prior cholecystectomy with complicated by abscess.  Since last being seen the patient has numerous emergency room visits on 7/14-7/21 for cholecystitis, 7/23 for erythema of the JP drain, 8/1-8/6 for post-surgical infection/sepsis, 8/15  for chest pain, 8/25 for leg pain, 8/31 for leg pain, 9/20 for chest pain, 9/21 for rectal pain, 10/16 for abdominal pain, and 10/23 again for chest pain  He has had numerous CT scans over the last few weeks which show stable findings.   He has a history of prior stroke and has aphasia. He lives with.  He denies any fever, chills, or night sweats.  His breathing is ***.  Chest pain?  Denies any hemoptysis.  Denies any nausea, vomiting, diarrhea, or constipation.  Denies any headache or visual changes.  He is here today for evaluation and to review his scan results.         MEDICAL HISTORY: Past Medical History:  Diagnosis Date   Asthma    Atypical chest pain 08/13/2022   Community acquired pneumonia 09/14/2022   COPD (chronic obstructive pulmonary disease) (HCC)    Essential hypertension 08/19/2021   GERD (gastroesophageal reflux disease)    HAP (hospital-acquired pneumonia) 09/16/2022   History of tracheostomy    03/09/22-04/11/22   HLD (hyperlipidemia)    Hypertension    Hypokalemia 08/13/2022   Hypomagnesemia 08/13/2022   Lung cancer (HCC)    PAD (peripheral artery disease) (HCC)    Seizures (HCC) 06/02/2022   Sepsis (HCC) 08/13/2022   Sepsis due to pneumonia (HCC) 04/13/2023   Stroke (HCC) 02/2022    ALLERGIES:  has No Known Allergies.  MEDICATIONS:  Current Outpatient Medications  Medication Sig Dispense Refill   albuterol (PROVENTIL) (2.5 MG/3ML) 0.083% nebulizer solution Take 3 mLs (2.5 mg total) by nebulization every 4 (four) hours as needed for wheezing or shortness of breath. 90 mL  12   apixaban (ELIQUIS) 5 MG TABS tablet TAKE 1 TABLET(5 MG) BY MOUTH TWICE DAILY (Patient taking differently: Take 5 mg by mouth 2 (two) times daily.) 180 tablet 2   atorvastatin (LIPITOR) 40 MG tablet Take 1 tablet (40 mg total) by mouth daily. 90 tablet 3   budesonide (PULMICORT) 0.5 MG/2ML nebulizer solution NEW PRESCRIPTION REQEUST: BUDESONIDE 0.5 MG/ - USE ONE VIAL TWICE DAILY  (Patient not taking: Reported on 08/19/2023) 180 mL 3   levETIRAcetam (KEPPRA) 500 MG tablet Take 1 tablet (500 mg total) by mouth 2 (two) times daily. 180 tablet 3   midodrine (PROAMATINE) 2.5 MG tablet Take 2.5 mg by mouth 2 (two) times daily with a meal.     Multiple Vitamin (MULTIVITAMIN WITH MINERALS) TABS tablet Take 1 tablet by mouth daily.     oxyCODONE-acetaminophen (PERCOCET) 5-325 MG tablet Take 1 tablet by mouth every 6 (six) hours as needed. 28 tablet 0   pantoprazole (PROTONIX) 40 MG tablet Take 1 tablet (40 mg total) by mouth daily. 30 tablet 0   sucralfate (CARAFATE) 1 GM/10ML suspension Take 10 mLs (1 g total) by mouth 4 (four) times daily -  with meals and at bedtime. 420 mL 0   Tiotropium Bromide-Olodaterol (STIOLTO RESPIMAT) 2.5-2.5 MCG/ACT AERS NEW PRESCRIPTION REQUEST: STIOLTO 2.5 MCG- INHALE TWO PUFFS BY MOUTH DAILY (Patient taking differently: Inhale 2 puffs into the lungs daily.) 12 g 3   No current facility-administered medications for this visit.    SURGICAL HISTORY:  Past Surgical History:  Procedure Laterality Date   BRONCHIAL BIOPSY  05/30/2022   Procedure: BRONCHIAL BIOPSIES;  Surgeon: Leslye Peer, MD;  Location: [facility] ENDOSCOPY;  Service: Pulmonary;;   BRONCHIAL BRUSHINGS  05/30/2022   Procedure: BRONCHIAL BRUSHINGS;  Surgeon: Leslye Peer, MD;  Location: [facility] ENDOSCOPY;  Service: Pulmonary;;   BRONCHIAL NEEDLE ASPIRATION BIOPSY  05/30/2022   Procedure: BRONCHIAL NEEDLE ASPIRATION BIOPSIES;  Surgeon: Leslye Peer, MD;  Location: [facility] ENDOSCOPY;  Service: Pulmonary;;   BRONCHIAL WASHINGS  05/30/2022   Procedure: BRONCHIAL WASHINGS;  Surgeon: Leslye Peer, MD;  Location: [facility] ENDOSCOPY;  Service: Pulmonary;;   CHOLECYSTECTOMY N/A 07/05/2023   Procedure: LAPAROSCOPIC CHOLECYSTECTOMY WITH INTRAOPERATIVE CHOLANGIOGRAM;  Surgeon: Karie Soda, MD;  Location: [facility] ORS;  Service: General;  Laterality: N/A;   ERCP N/A 07/06/2023   Procedure: ENDOSCOPIC RETROGRADE  CHOLANGIOPANCREATOGRAPHY (ERCP);  Surgeon: Vida Rigger, MD;  Location: [facility] ENDOSCOPY;  Service: Gastroenterology;  Laterality: N/A;   ESOPHAGOGASTRODUODENOSCOPY (EGD) WITH PROPOFOL N/A 03/11/2022   Procedure: ESOPHAGOGASTRODUODENOSCOPY (EGD) WITH PROPOFOL;  Surgeon: Violeta Gelinas, MD;  Location: [facility] ENDOSCOPY;  Service: General;  Laterality: N/A;   FIDUCIAL MARKER PLACEMENT  05/30/2022   Procedure: FIDUCIAL MARKER PLACEMENT;  Surgeon: Leslye Peer, MD;  Location: [facility] ENDOSCOPY;  Service: Pulmonary;;   IR ANGIO INTRA EXTRACRAN SEL COM CAROTID INNOMINATE UNI R MOD SED  03/02/2022   IR CT HEAD LTD  03/02/2022   IR PERCUTANEOUS ART THROMBECTOMY/INFUSION INTRACRANIAL INC DIAG ANGIO  03/02/2022   PEG PLACEMENT N/A 03/11/2022   Procedure: PERCUTANEOUS ENDOSCOPIC GASTROSTOMY (PEG) PLACEMENT;  Surgeon: Violeta Gelinas, MD;  [facility]COPY;  Service: General;  Laterality: N/A;   RADIOLOGY WITH ANESTHESIA N/A 03/02/2022   Procedure: IR WITH ANESTHESIA;  Surgeon: Julieanne Cotton, MD;  Location: [facility] OR;  Service: Radiology;  Laterality: N/A;   REMOVAL OF STONES  07/06/2023   Procedure: REMOVAL OF STONES;  Surgeon: Vida Rigger, MD;  Location: [facility] ENDOSCOPY;  Service: Gastroenterology;;   Dennison Mascot  07/06/2023   Procedure: SPHINCTEROTOMY;  Surgeon: Vida Rigger, MD;  Location: [facility] ENDOSCOPY;  Service: Gastroenterology;;   VIDEO BRONCHOSCOPY WITH RADIAL ENDOBRONCHIAL ULTRASOUND  05/30/2022   Procedure: VIDEO BRONCHOSCOPY WITH RADIAL ENDOBRONCHIAL ULTRASOUND;  Surgeon: Leslye Peer, MD;  Location: [facility] ENDOSCOPY;  Service: Pulmonary;;    REVIEW OF SYSTEMS:   Review of Systems  Constitutional: Negative for appetite change, chills, fatigue, fever and unexpected weight change.  HENT:   Negative for mouth sores, nosebleeds, sore throat and trouble swallowing.   Eyes: Negative for eye problems and icterus.  Respiratory: Negative for cough, hemoptysis, shortness of breath and wheezing.   Cardiovascular:  Negative for chest pain and leg swelling.  Gastrointestinal: Negative for abdominal pain, constipation, diarrhea, nausea and vomiting.  Genitourinary: Negative for bladder incontinence, difficulty urinating, dysuria, frequency and hematuria.   Musculoskeletal: Negative for back pain, gait problem, neck pain and neck stiffness.  Skin: Negative for itching and rash.  Neurological: Negative for dizziness, extremity weakness, gait problem, headaches, light-headedness and seizures.  Hematological: Negative for adenopathy. Does not bruise/bleed easily.  Psychiatric/Behavioral: Negative for confusion, depression and sleep disturbance. The patient is not nervous/anxious.     PHYSICAL EXAMINATION:  There were no vitals taken for this visit.  ECOG PERFORMANCE STATUS: {CHL ONC ECOG [id number]  Physical Exam  Constitutional: Oriented to person, place, and time and well-developed, well-nourished, and in no distress. No distress.  HENT:  Head: Normocephalic and atraumatic.  Mouth/Throat: Oropharynx is clear and moist. No oropharyngeal exudate.  Eyes: Conjunctivae are normal. Right eye exhibits no discharge. Left eye exhibits no discharge. No scleral icterus.  Neck: Normal range of motion. Neck supple.  Cardiovascular: Normal rate, regular rhythm, normal heart sounds and intact distal pulses.   Pulmonary/Chest: Effort normal and breath sounds normal. No respiratory distress. No wheezes. No rales.  Abdominal: Soft. Bowel sounds are normal. Exhibits no distension and no mass. There is no tenderness.  Musculoskeletal: Normal range of motion. Exhibits no edema.  Lymphadenopathy:    No cervical adenopathy.  Neurological: Alert and oriented to person, place, and time. Exhibits normal muscle tone. Gait normal. Coordination normal.  Skin: Skin is warm and dry. No rash noted. Not diaphoretic. No erythema. No pallor.  Psychiatric: Mood, memory and judgment normal.  Vitals reviewed.  LABORATORY DATA: Lab  Results  Component Value Date   WBC 5.5 10/11/2023   HGB 12.3 (L) 10/11/2023   HCT 38.6 (L) 10/11/2023   MCV 94.1 10/11/2023   PLT 159 10/11/2023      Chemistry      Component Value Date/Time   NA 141 10/11/2023 1628   NA 140 09/09/2022 0908   K 3.9 10/11/2023 1628   CL 104 10/11/2023 1628   CO2 29 10/11/2023 1628   BUN 15 10/11/2023 1628   BUN 16 09/09/2022 0908   CREATININE 0.76 10/11/2023 1628   CREATININE 0.77 09/13/2023 1020      Component Value Date/Time   CALCIUM 9.0 10/11/2023 1628   ALKPHOS 91 10/04/2023 1515   AST 17 10/04/2023 1515   AST 15 09/13/2023 1020   ALT 14 10/04/2023 1515   ALT 12 09/13/2023 1020   BILITOT 0.7 10/04/2023 1515   BILITOT 0.4 09/13/2023 1020       RADIOGRAPHIC STUDIES:  CT ABDOMEN PELVIS W CONTRAST  Result Date: 10/04/2023 CLINICAL DATA:  Abdomen pain EXAM: CT ABDOMEN AND PELVIS WITH CONTRAST TECHNIQUE: Multidetector CT imaging of the abdomen and pelvis was performed using the standard protocol following bolus administration  of intravenous contrast. RADIATION DOSE REDUCTION: This exam was performed according to the departmental dose-optimization program which includes automated exposure control, adjustment of the mA and/or kV according to patient size and/or use of iterative reconstruction technique. CONTRAST:  OMNIPAQUE IOHEXOL 300 MG/ML  SOLN COMPARISON:  09/10/2023, 07/14/2023 FINDINGS: Lower chest: Lung bases demonstrate no acute airspace disease. Scarring at the lung bases. Hepatobiliary: Small volume pneumobilia in the left hepatic lobe presumably due to prior sphincterotomy. Cholecystectomy. No biliary dilatation Pancreas: Unremarkable. No pancreatic ductal dilatation or surrounding inflammatory changes. Spleen: Normal in size without focal abnormality. Adrenals/Urinary Tract: Adrenal glands are normal. Kidneys show prominent extrarenal collecting systems but no calyceal dilatation, or obstructing ureteral stone. Bladder is  unremarkable Stomach/Bowel: Stomach nonenlarged. There is no dilated small bowel. No acute bowel wall thickening. Negative appendix Vascular/Lymphatic: Advanced aortic atherosclerosis. 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 Multifocal inflow stenoses as before. No suspicious nodes Reproductive: Prostate is unremarkable. Other: Negative for pelvic effusion or free air. Musculoskeletal: No acute or suspicious osseous abnormality IMPRESSION: 1. No CT evidence for acute intra-abdominal or pelvic abnormality. 2. Advanced aortic atherosclerosis with chronic occlusion of the left common iliac and bilateral superficial femoral arteries. Aortic Atherosclerosis (ICD10-I70.0). Electronically Signed   By: Jasmine Pang M.D.   On: 10/04/2023 20:12   CT Chest W Contrast  Result Date: 09/27/2023 CLINICAL DATA:  Non-small cell lung cancer restaging * Tracking Code: BO * EXAM: CT CHEST WITH CONTRAST TECHNIQUE: Multidetector CT imaging of the chest was performed during intravenous contrast administration. RADIATION DOSE REDUCTION: This exam was performed according to the departmental dose-optimization program which includes automated exposure control, adjustment of the mA and/or kV according to patient size and/or use of iterative reconstruction technique. CONTRAST:  75mL OMNIPAQUE IOHEXOL 300 MG/ML  SOLN COMPARISON:  09/08/2023 FINDINGS: Cardiovascular: Coronary, aortic arch, and branch vessel atherosclerotic vascular disease. Mediastinum/Nodes: No pathologic adenopathy observed. Lungs/Pleura: Severe emphysema. Stable band of density oriented anterior-posterior with width of about 1.9 cm primarily in the right upper lobe with a small amount in the superior segment right lower lobe, and a central fiducial on image 72 series 6, similar appearance on 09/08/2023. Stable left upper lobe anterior scarring and confluent density in the left upper lobe and superior segment left lower lobe  probably from left-sided radiation therapy. No substantial change. Extensive centrilobular emphysema. Airway thickening is present, suggesting bronchitis or reactive airways disease. On 09/08/2023 there was a 2.2 cm in long axis subpleural nodule in the right lower lobe. This has essentially resolved, indicating a previous inflammatory etiology. No new nodule identified. Old granulomatous disease in the left lower lobe. Upper Abdomen: Abdominal aortic atheromatous vascular calcification with some atheromatous plaque eccentric to the left at the proximal SMA. Pneumobilia. Cholecystectomy. Musculoskeletal: Thoracic spondylosis. IMPRESSION: 1. Stable radiation therapy related findings in the upper lobes and superior segments of the lower lobes. 2. On 09/08/2023 there was a 2.2 cm in long axis subpleural nodule in the right lower lobe. This has essentially resolved, indicating a previous inflammatory etiology. 3. Airway thickening is present, suggesting bronchitis or reactive airways disease. 4. Coronary, aortic arch, and branch vessel atherosclerotic vascular disease. Aortic Atherosclerosis (ICD10-I70.0) and Emphysema (ICD10-J43.9). Electronically Signed   By: Gaylyn Rong M.D.   On: 09/27/2023 12:28     ASSESSMENT/PLAN:  This is a very pleasant 62 year old Caucasian male with history of stage IIb (T2a, N1, M0) non-small cell lung cancer, squamous cell carcinoma.  He presented with a  left upper lobe lung mass in addition to left hilar adenopathy.  He was diagnosed in June 2023.  He also had synchronous stage Ia (T1c, N0, M0) non-small cell lung cancer, squamous cell carcinoma.  He presented with a right upper lobe lung nodule.  He was diagnosed in June 2023.  His PD-L1 expression is 20%.  He underwent a course of concurrent chemoradiation with weekly carboplatin for an AUC of 2 and paclitaxel 45 mg/m.  He status post 5 cycles.  His last dose was given on 08/01/2022.  The patient was last seen by Dr.  Arbutus Ped in July.  There was some concern about disease recurrence.  He ended up having a PET scan which the findings were most consistent with radiation inflammation.  Recommended short-term interval follow-up CT scan.  Unfortunately the patient was seen in the hospital and emergency room on several occasions since his last appointment.  The patient was seen with Dr. Arbutus Ped today.  Dr. Arbutus Ped personally and independently reviewed his most recent CT scan.  His scan showed ***commend that he continue on observation with a repeat CT scan of the chest in 3 months.  We will see him back for follow-up visit 1 week after his CT scan to review the results.  The patient was advised to call immediately if he has any concerning symptoms in the interval. The patient voices understanding of current disease status and treatment options and is in agreement with the current care plan. All questions were answered. The patient knows to call the clinic with any problems, questions or concerns. We can certainly see the patient much sooner if necessary      No orders of the defined types were placed in this encounter.    I spent {CHL ONC TIME VISIT - WGNFA:[id number]} counseling the patient face to face. The total time spent in the appointment was {CHL ONC TIME VISIT - ONGEX:[id number]}.  Seraphina Mitchner L Travelle Mcclimans, PA-C 10/16/23

## 2023-10-17 ENCOUNTER — Encounter (HOSPITAL_COMMUNITY): Payer: Self-pay

## 2023-10-17 ENCOUNTER — Other Ambulatory Visit: Payer: Self-pay

## 2023-10-17 ENCOUNTER — Emergency Department (HOSPITAL_COMMUNITY): Payer: Medicaid Other

## 2023-10-17 ENCOUNTER — Emergency Department (HOSPITAL_COMMUNITY)
Admission: EM | Admit: 2023-10-17 | Discharge: 2023-10-18 | Disposition: A | Discharge status: Home / Self Care | Payer: Medicaid Other

## 2023-10-17 DIAGNOSIS — Z7901 Long term (current) use of anticoagulants: Secondary | ICD-10-CM | POA: Insufficient documentation

## 2023-10-17 DIAGNOSIS — Z85118 Personal history of other malignant neoplasm of bronchus and lung: Secondary | ICD-10-CM | POA: Diagnosis not present

## 2023-10-17 DIAGNOSIS — R109 Unspecified abdominal pain: Secondary | ICD-10-CM | POA: Diagnosis present

## 2023-10-17 DIAGNOSIS — N50819 Testicular pain, unspecified: Secondary | ICD-10-CM | POA: Insufficient documentation

## 2023-10-17 LAB — CBC
HCT: 43 % (ref 39.0–52.0)
Hemoglobin: 13.5 g/dL (ref 13.0–17.0)
MCH: 29.7 pg (ref 26.0–34.0)
MCHC: 31.4 g/dL (ref 30.0–36.0)
MCV: 94.7 fL (ref 80.0–100.0)
Platelets: 168 10*3/uL (ref 150–400)
RBC: 4.54 MIL/uL (ref 4.22–5.81)
RDW: 13.7 % (ref 11.5–15.5)
WBC: 6 10*3/uL (ref 4.0–10.5)
nRBC: 0 % (ref 0.0–0.2)

## 2023-10-17 LAB — URINALYSIS, ROUTINE W REFLEX MICROSCOPIC
Bilirubin Urine: NEGATIVE
Glucose, UA: NEGATIVE mg/dL
Hgb urine dipstick: NEGATIVE
Ketones, ur: NEGATIVE mg/dL
Leukocytes,Ua: NEGATIVE
Nitrite: NEGATIVE
Protein, ur: NEGATIVE mg/dL
Specific Gravity, Urine: 1.019 (ref 1.005–1.030)
pH: 5 (ref 5.0–8.0)

## 2023-10-17 LAB — COMPREHENSIVE METABOLIC PANEL
ALT: 19 U/L (ref 0–44)
AST: 22 U/L (ref 15–41)
Albumin: 4.4 g/dL (ref 3.5–5.0)
Alkaline Phosphatase: 84 U/L (ref 38–126)
Anion gap: 9 (ref 5–15)
BUN: 21 mg/dL (ref 8–23)
CO2: 27 mmol/L (ref 22–32)
Calcium: 9 mg/dL (ref 8.9–10.3)
Chloride: 101 mmol/L (ref 98–111)
Creatinine, Ser: 0.78 mg/dL (ref 0.61–1.24)
GFR, Estimated: >60 mL/min (ref >60)
Glucose, Bld: 105 mg/dL — ABNORMAL HIGH (ref 70–99)
Potassium: 3.7 mmol/L (ref 3.5–5.1)
Sodium: 137 mmol/L (ref 135–145)
Total Bilirubin: 0.6 mg/dL (ref 0.3–1.2)
Total Protein: 8 g/dL (ref 6.5–8.1)

## 2023-10-17 LAB — LIPASE, BLOOD: Lipase: 27 U/L (ref 11–51)

## 2023-10-17 MED ORDER — KETOROLAC TROMETHAMINE 15 MG/ML IJ SOLN
15.0000 mg | Freq: Once | INTRAMUSCULAR | Status: AC
  Administered 2023-10-17: 15 mg via INTRAVENOUS
  Filled 2023-10-17: qty 1

## 2023-10-17 MED ORDER — CEPHALEXIN 500 MG PO CAPS: 500.0000 mg | ORAL_CAPSULE | Freq: Once | ORAL | Status: DC

## 2023-10-17 NOTE — ED Provider Notes (Signed)
[place] EMERGENCY DEPARTMENT AT [facility] Provider Note   CSN: [id number] Arrival date & time: 10/17/23  1431     History  Chief Complaint  Patient presents with   Abdominal Pain   Flank Pain    Todd Estrada is a 62 y.o. male.  62 year old male with past medical history of lung cancer in remission as well as CVA with severe expressive aphasia presenting to the emergency department today with possible abdominal and testicular pain.  The patient has been seen twice now in the past month or so for similar complaints.  He has had 2 CT scans performed at each of his 2 most recent visits which were unremarkable.  When asking the patient yes or no questions he does seem to be answering appropriately.  He says no to any vomiting or diarrhea.  He is pointing to his lower abdomen and when asked where he hurts he unzipped his pants and is pointing towards his penis and testicles.   Abdominal Pain Flank Pain Associated symptoms include abdominal pain.       Home Medications Prior to Admission medications   Medication Sig Start Date End Date Taking? Authorizing Provider  cephALEXin (KEFLEX) 500 MG capsule Take 1 capsule (500 mg total) by mouth 4 (four) times daily. 10/17/23  Yes Durwin Glaze, MD  oxyCODONE-acetaminophen (PERCOCET/ROXICET) 5-325 MG tablet Take 1 tablet by mouth every 6 (six) hours as needed for severe pain (pain score 7-10). 10/17/23  Yes Durwin Glaze, MD  albuterol (PROVENTIL) (2.5 MG/3ML) 0.083% nebulizer solution Take 3 mLs (2.5 mg total) by nebulization every 4 (four) hours as needed for wheezing or shortness of breath. 08/16/22   Steffanie Rainwater, MD  apixaban (ELIQUIS) 5 MG TABS tablet TAKE 1 TABLET(5 MG) BY MOUTH TWICE DAILY Patient taking differently: Take 5 mg by mouth 2 (two) times daily. 04/11/23   Briscoe Burns, MD  atorvastatin (LIPITOR) 40 MG tablet Take 1 tablet (40 mg total) by mouth daily. 01/03/23 01/03/24  Briscoe Burns, MD   budesonide (PULMICORT) 0.5 MG/2ML nebulizer solution NEW PRESCRIPTION REQEUST: BUDESONIDE 0.5 MG/ - USE ONE VIAL TWICE DAILY Patient not taking: Reported on 08/19/2023 11/29/22   Martina Sinner, MD  levETIRAcetam (KEPPRA) 500 MG tablet Take 1 tablet (500 mg total) by mouth 2 (two) times daily. 12/07/22   Ihor Austin, NP  midodrine (PROAMATINE) 2.5 MG tablet Take 2.5 mg by mouth 2 (two) times daily with a meal.    [provider]  Multiple Vitamin (MULTIVITAMIN WITH MINERALS) TABS tablet Take 1 tablet by mouth daily.    [provider]  oxyCODONE-acetaminophen (PERCOCET) 5-325 MG tablet Take 1 tablet by mouth every 6 (six) hours as needed. 09/20/23   Atway, Rayann N, DO  pantoprazole (PROTONIX) 40 MG tablet Take 1 tablet (40 mg total) by mouth daily. 09/20/23 10/20/23  Atway, Rayann N, DO  sucralfate (CARAFATE) 1 GM/10ML suspension Take 10 mLs (1 g total) by mouth 4 (four) times daily -  with meals and at bedtime. 07/02/23   Nira Conn, MD  Tiotropium Bromide-Olodaterol (STIOLTO RESPIMAT) 2.5-2.5 MCG/ACT AERS NEW PRESCRIPTION REQUEST: STIOLTO 2.5 MCG- INHALE TWO PUFFS BY MOUTH DAILY Patient taking differently: Inhale 2 puffs into the lungs daily. 11/29/22   Martina Sinner, MD      Allergies    Patient has no known allergies.    Review of Systems   Review of Systems  Gastrointestinal:  Positive for abdominal pain.  Genitourinary:  Positive for flank pain.  All other systems reviewed and are negative.   Physical Exam Updated Vital Signs BP 127/72   Pulse 72   Temp 98.8 F (37.1 C)   Resp 10   SpO2 95%  Physical Exam Vitals and nursing note reviewed.   Gen: NAD Eyes: PERRL, EOMI HEENT: no oropharyngeal swelling Neck: trachea midline Resp: clear to auscultation bilaterally Card: RRR, no murmurs, rubs, or gallops Abd: Mild diffuse tenderness without any guarding or rebound GU: The patient's penis and testicles are normal in appearance.  He  continues to point towards his testicles and penis when asked where he is hurting.  I do not appreciate any significant erythema or swelling.  Surrounding skin is normal in appearance with no crepitus. Extremities: no calf tenderness, no edema Vascular: 2+ radial pulses bilaterally, 2+ DP pulses bilaterally Neuro: Right-sided contractures noted, severe expressive aphasia noted Skin: no rashes Psyc: acting appropriately   ED Results / Procedures / Treatments   Labs (all labs ordered are listed, but only abnormal results are displayed) Labs Reviewed  COMPREHENSIVE METABOLIC PANEL - Abnormal; Notable for the following components:      Result Value   Glucose, Bld 105 (*)    All other components within normal limits  LIPASE, BLOOD  CBC  URINALYSIS, ROUTINE W REFLEX MICROSCOPIC    EKG None  Radiology US SCROTUM W/DOPPLER  Result Date: 10/17/2023 CLINICAL DATA:  Acute testicular pain EXAM: SCROTAL ULTRASOUND DOPPLER ULTRASOUND OF THE TESTICLES TECHNIQUE: Complete ultrasound examination of the testicles, epididymis, and other scrotal structures was performed. Color and spectral Doppler ultrasound were also utilized to evaluate blood flow to the testicles. COMPARISON:  None FINDINGS: Right testicle Measurements: 4.0 x 2.4 x 1.9 cm. The right testicle is enlarged and hypoechoic relative to the left testicle, though I do not see any focal abnormality or mass lesion. Left testicle Measurements: 2.6 x 3.1 x 1.9 cm. No mass or microlithiasis visualized. Right epididymis: Incidental 5 mm epididymal cyst, of doubtful clinical significance. Otherwise unremarkable appearance of the right epididymis. Left epididymis:  Normal in size and appearance. Hydrocele:  Trace bilateral hydroceles, right greater than left. Varicocele:  None visualized. Pulsed Doppler interrogation of both testes demonstrates normal low resistance arterial and venous waveforms within the left testicle. On the right, there is decreased  vascularity based on color flow, though normal arterial and venous waveforms are identified. IMPRESSION: 1. Asymmetric enlargement the right testicle, which is hypoechoic and demonstrates decreased vascularity on color flow imaging. Despite the presence of detectable arterial and venous waveforms, findings are suspicious for right testicular torsion. 2. Trace bilateral hydroceles. Critical Value/emergent results were called by telephone at the time of interpretation on 10/17/2023 at 11:25 pm to provider Lucas Winograd , who verbally acknowledged these results. Electronically Signed   By: Sharlet Salina M.D.   On: 10/17/2023 23:28    Procedures Procedures    Medications Ordered in ED Medications  cephALEXin (KEFLEX) capsule 500 mg (has no administration in time range)  ketorolac (TORADOL) 15 MG/ML injection 15 mg (15 mg Intravenous Given 10/17/23 2040)    ED Course/ Medical Decision Making/ A&P                                 Medical Decision Making 62 year old male with past medical history of lung cancer in remission and CVA with severe expressive aphasia presenting to the emergency department today with apparent  abdominal pain and penile/testicular pain.  Unfortunately was very difficult to obtain any history from the patient as he does have a severe expressive aphasia.  I will further evaluate the patient here with basic labs including LFTs and a lipase to evaluate for hepatobiliary pathology or pancreatitis.  I will obtain a urinalysis here to eval for urinary tract infection.  Will obtain a testicular ultrasound to evaluate for torsion.  I do not appreciate any findings on exam consistent with Fournier's gangrene.  He is otherwise well-appearing with stable vital signs.  The patient has had 2 CT scans in the past month that have been unremarkable and reviewing through the body there are no kidney stones.  I will give the patient Toradol here for pain.  At this point this seems to be more of a  chronic issue for the patient.  I will reevaluate for ultimate disposition.  The patient's labs are reassuring.  His ultrasound does show some decreased flow to the right testicle.  I did call and discussed this case with Dr. Alvester Morin.  On reexam the patient's left testicle is actually more high riding than the right.  He is resting comfortably on reassessment and watching TV.  Dr. Alvester Morin felt that acute torsion would be relatively unlikely given his clinical exam.  I did perform an exam here and described exactly what I was seeing and palpating.  He recommended starting the patient on Keflex and having him follow-up in the clinic.  After discussion with the patient's comorbidities, with some diagnostic uncertainty the given the patient's communication deficit with his aphasia is felt that the best thing for the patient would be to treat him conservatively and to try to avoid unnecessary surgery.  He will be discharged with urology follow-up.  Amount and/or Complexity of Data Reviewed Labs: ordered. Radiology: ordered.  Risk Prescription drug management.          Final Clinical Impression(s) / ED Diagnoses Final diagnoses:  Pain in testicle, unspecified laterality    Rx / DC Orders ED Discharge Orders          Ordered    cephALEXin (KEFLEX) 500 MG capsule  4 times daily        10/18/23 0000    oxyCODONE-acetaminophen (PERCOCET/ROXICET) 5-325 MG tablet  Every 6 hours PRN        10/18/23 0000              Durwin Glaze, MD 10/18/23 0000

## 2023-10-17 NOTE — ED Triage Notes (Signed)
Pt appears to have expressive aphagia. Pt expressing pain on his right abdomen/flank area. Pt endorses pain with urination, and diarrhea. States s/s began today. Denies any N/V.

## 2023-10-18 MED ORDER — OXYCODONE-ACETAMINOPHEN 5-325 MG PO TABS
1.0000 | ORAL_TABLET | Freq: Four times a day (QID) | ORAL | 0 refills | Status: DC | PRN
Start: 2023-10-17 — End: 2024-01-29

## 2023-10-18 MED ORDER — CEPHALEXIN 500 MG PO CAPS: 500.0000 mg | ORAL_CAPSULE | Freq: Four times a day (QID) | ORAL | 0 refills | Status: DC

## 2023-10-18 NOTE — Discharge Instructions (Addendum)
Appear a little abnormal here today.  I did call and discussed this with the urologist.  They recommended placing you on antibiotics.  Please call and schedule a follow-up appointment with Dr. Shannan Harper office.  Take the antibiotic as prescribed.  Take 1000 mg of Tylenol every 8 hours as needed for pain.  Follow-up with urology and return to the ER for worsening symptoms.

## 2023-10-19 ENCOUNTER — Inpatient Hospital Stay: Payer: Medicaid Other | Admitting: Physician Assistant

## 2023-10-30 ENCOUNTER — Other Ambulatory Visit: Payer: Self-pay | Admitting: Anesthesiology

## 2023-10-30 MED ORDER — LEVETIRACETAM 500 MG PO TABS: 500.0000 mg | ORAL_TABLET | Freq: Two times a day (BID) | ORAL | 0 refills | Status: DC

## 2023-10-31 ENCOUNTER — Emergency Department (HOSPITAL_COMMUNITY): Payer: Medicaid Other

## 2023-10-31 ENCOUNTER — Emergency Department (HOSPITAL_COMMUNITY)
Admission: EM | Admit: 2023-10-31 | Discharge: 2023-10-31 | Disposition: A | Discharge status: Home / Self Care | Payer: Medicaid Other | Attending: Emergency Medicine | Admitting: Emergency Medicine

## 2023-10-31 DIAGNOSIS — Z7901 Long term (current) use of anticoagulants: Secondary | ICD-10-CM | POA: Diagnosis not present

## 2023-10-31 DIAGNOSIS — M25551 Pain in right hip: Secondary | ICD-10-CM | POA: Insufficient documentation

## 2023-10-31 DIAGNOSIS — W19XXXA Unspecified fall, initial encounter: Secondary | ICD-10-CM | POA: Diagnosis not present

## 2023-10-31 DIAGNOSIS — M25511 Pain in right shoulder: Secondary | ICD-10-CM | POA: Diagnosis present

## 2023-10-31 LAB — COMPREHENSIVE METABOLIC PANEL
ALT: 19 U/L (ref 0–44)
AST: 24 U/L (ref 15–41)
Albumin: 4.3 g/dL (ref 3.5–5.0)
Alkaline Phosphatase: 85 U/L (ref 38–126)
Anion gap: 10 (ref 5–15)
BUN: 15 mg/dL (ref 8–23)
CO2: 27 mmol/L (ref 22–32)
Calcium: 9.6 mg/dL (ref 8.9–10.3)
Chloride: 104 mmol/L (ref 98–111)
Creatinine, Ser: 0.76 mg/dL (ref 0.61–1.24)
GFR, Estimated: >60 mL/min (ref >60)
Glucose, Bld: 96 mg/dL (ref 70–99)
Potassium: 3.4 mmol/L — ABNORMAL LOW (ref 3.5–5.1)
Sodium: 141 mmol/L (ref 135–145)
Total Bilirubin: 0.6 mg/dL (ref <1.2)
Total Protein: 7.6 g/dL (ref 6.5–8.1)

## 2023-10-31 LAB — CBC
HCT: 41.7 % (ref 39.0–52.0)
Hemoglobin: 13 g/dL (ref 13.0–17.0)
MCH: 29.7 pg (ref 26.0–34.0)
MCHC: 31.2 g/dL (ref 30.0–36.0)
MCV: 95.4 fL (ref 80.0–100.0)
Platelets: 146 10*3/uL — ABNORMAL LOW (ref 150–400)
RBC: 4.37 MIL/uL (ref 4.22–5.81)
RDW: 13.4 % (ref 11.5–15.5)
WBC: 7.8 10*3/uL (ref 4.0–10.5)
nRBC: 0 % (ref 0.0–0.2)

## 2023-10-31 LAB — URINALYSIS, ROUTINE W REFLEX MICROSCOPIC
Bilirubin Urine: NEGATIVE
Glucose, UA: NEGATIVE mg/dL
Hgb urine dipstick: NEGATIVE
Ketones, ur: NEGATIVE mg/dL
Leukocytes,Ua: NEGATIVE
Nitrite: NEGATIVE
Protein, ur: NEGATIVE mg/dL
Specific Gravity, Urine: 1.019 (ref 1.005–1.030)
pH: 6 (ref 5.0–8.0)

## 2023-10-31 LAB — LIPASE, BLOOD: Lipase: 28 U/L (ref 11–51)

## 2023-10-31 NOTE — ED Provider Notes (Signed)
[place] EMERGENCY DEPARTMENT AT [facility] Provider Note   CSN: [id number] Arrival date & time: 10/31/23  3016     History  No chief complaint on file.   Todd Estrada is a 62 y.o. male.  62 yo M with a chief complaint of a fall.  He landed on his right side.  Complaining of right shoulder and right hip pain.  He denies head injury.  History is somewhat limited due to aphasia from a prior stroke.        Home Medications Prior to Admission medications   Medication Sig Start Date End Date Taking? Authorizing Provider  albuterol (PROVENTIL) (2.5 MG/3ML) 0.083% nebulizer solution Take 3 mLs (2.5 mg total) by nebulization every 4 (four) hours as needed for wheezing or shortness of breath. 08/16/22   Steffanie Rainwater, MD  apixaban (ELIQUIS) 5 MG TABS tablet TAKE 1 TABLET(5 MG) BY MOUTH TWICE DAILY Patient taking differently: Take 5 mg by mouth 2 (two) times daily. 04/11/23   Briscoe Burns, MD  atorvastatin (LIPITOR) 40 MG tablet Take 1 tablet (40 mg total) by mouth daily. 01/03/23 01/03/24  Briscoe Burns, MD  budesonide (PULMICORT) 0.5 MG/2ML nebulizer solution NEW PRESCRIPTION REQEUST: BUDESONIDE 0.5 MG/ - USE ONE VIAL TWICE DAILY Patient not taking: Reported on 08/19/2023 11/29/22   Martina Sinner, MD  cephALEXin (KEFLEX) 500 MG capsule Take 1 capsule (500 mg total) by mouth 4 (four) times daily. 10/17/23   Durwin Glaze, MD  levETIRAcetam (KEPPRA) 500 MG tablet Take 1 tablet (500 mg total) by mouth 2 (two) times daily. 10/30/23   Ihor Austin, NP  midodrine (PROAMATINE) 2.5 MG tablet Take 2.5 mg by mouth 2 (two) times daily with a meal.    [provider]  Multiple Vitamin (MULTIVITAMIN WITH MINERALS) TABS tablet Take 1 tablet by mouth daily.    [provider]  oxyCODONE-acetaminophen (PERCOCET) 5-325 MG tablet Take 1 tablet by mouth every 6 (six) hours as needed. 09/20/23   Atway, Rayann N, DO  oxyCODONE-acetaminophen (PERCOCET/ROXICET)  5-325 MG tablet Take 1 tablet by mouth every 6 (six) hours as needed for severe pain (pain score 7-10). 10/17/23   Durwin Glaze, MD  pantoprazole (PROTONIX) 40 MG tablet Take 1 tablet (40 mg total) by mouth daily. 09/20/23 10/20/23  Atway, Rayann N, DO  sucralfate (CARAFATE) 1 GM/10ML suspension Take 10 mLs (1 g total) by mouth 4 (four) times daily -  with meals and at bedtime. 07/02/23   Nira Conn, MD  Tiotropium Bromide-Olodaterol (STIOLTO RESPIMAT) 2.5-2.5 MCG/ACT AERS NEW PRESCRIPTION REQUEST: STIOLTO 2.5 MCG- INHALE TWO PUFFS BY MOUTH DAILY Patient taking differently: Inhale 2 puffs into the lungs daily. 11/29/22   Martina Sinner, MD      Allergies    Patient has no known allergies.    Review of Systems   Review of Systems  Physical Exam Updated Vital Signs BP (!) 154/96   Pulse 77   Temp 98.2 F (36.8 C) (Oral)   Resp 20   SpO2 97%  Physical Exam Vitals and nursing note reviewed.  Constitutional:      Appearance: He is well-developed.  HENT:     Head: Normocephalic and atraumatic.  Eyes:     Pupils: Pupils are equal, round, and reactive to light.  Neck:     Vascular: No JVD.  Cardiovascular:     Rate and Rhythm: Normal rate and regular rhythm.     Heart sounds: No  murmur heard.    No friction rub. No gallop.  Pulmonary:     Effort: No respiratory distress.     Breath sounds: No wheezing.  Abdominal:     General: There is no distension.     Tenderness: There is no abdominal tenderness. There is no guarding or rebound.  Musculoskeletal:        General: Normal range of motion.     Cervical back: Normal range of motion and neck supple.     Comments: Patient has bracing on the right lower extremity.  I do not elicit any obvious discomfort with internal and external rotation of the right hip.  No obvious pain with compression of the pelvis.  No pain with palpation distally in the leg.  I am able to range the right shoulder without significant discomfort.  No  obvious pain about the right elbow or wrist.  Skin:    Coloration: Skin is not pale.     Findings: No rash.  Neurological:     Mental Status: He is alert.  Psychiatric:        Behavior: Behavior normal.     ED Results / Procedures / Treatments   Labs (all labs ordered are listed, but only abnormal results are displayed) Labs Reviewed  COMPREHENSIVE METABOLIC PANEL - Abnormal; Notable for the following components:      Result Value   Potassium 3.4 (*)    All other components within normal limits  CBC - Abnormal; Notable for the following components:   Platelets 146 (*)    All other components within normal limits  LIPASE, BLOOD  URINALYSIS, ROUTINE W REFLEX MICROSCOPIC    EKG None  Radiology DG Shoulder Right  Result Date: 10/31/2023 CLINICAL DATA:  Fall injury with right shoulder and right hip pain. EXAM: RIGHT SHOULDER - 2+ VIEW; DG HIP (WITH OR WITHOUT PELVIS) 2-3V RIGHT COMPARISON:  Right shoulder series 10/15/2022, AP pelvis and right hip views 02/08/2023. FINDINGS: Right shoulder, routine three views: There is osteopenia without evidence of fracture or dislocation. There is joint space loss and bulky reactive osteophytes are again noted at the [facility] joint, spurring and joint narrowing inferior glenohumeral joint, and mild cephalad migration of the humeral head suggesting a degree of rotator cuff arthropathy. Chronic spurring is again seen along the greater tuberosity. There are postsurgical changes and scarring in the right upper lobe. Visualized right ribs show no displaced fractures. No right apical pneumothorax. Comparison to the prior study reveals no significant interval change. AP pelvis and AP and frog-leg right hip views: Normal to slightly osteopenic bone density. No pelvic fracture or diastasis is seen. No evidence of right hip fracture or dislocation. Arthritic changes are not seen. Enthesopathic change both greater trochanters. There is bilateral patchy moderate  iliofemoral calcific arteriosclerosis. Soft tissues are otherwise unremarkable. IMPRESSION: 1. Osteopenia and degenerative change without evidence of right shoulder fracture or dislocation. 2. No evidence of pelvic or right hip fracture or dislocation. 3. Bilateral iliofemoral calcific arteriosclerosis. Electronically Signed   By: Almira Bar M.D.   On: 10/31/2023 20:17   DG Hip Unilat W or Wo Pelvis 2-3 Views Right  Result Date: 10/31/2023 CLINICAL DATA:  Fall injury with right shoulder and right hip pain. EXAM: RIGHT SHOULDER - 2+ VIEW; DG HIP (WITH OR WITHOUT PELVIS) 2-3V RIGHT COMPARISON:  Right shoulder series 10/15/2022, AP pelvis and right hip views 02/08/2023. FINDINGS: Right shoulder, routine three views: There is osteopenia without evidence of fracture or dislocation. There is  joint space loss and bulky reactive osteophytes are again noted at the [facility] joint, spurring and joint narrowing inferior glenohumeral joint, and mild cephalad migration of the humeral head suggesting a degree of rotator cuff arthropathy. Chronic spurring is again seen along the greater tuberosity. There are postsurgical changes and scarring in the right upper lobe. Visualized right ribs show no displaced fractures. No right apical pneumothorax. Comparison to the prior study reveals no significant interval change. AP pelvis and AP and frog-leg right hip views: Normal to slightly osteopenic bone density. No pelvic fracture or diastasis is seen. No evidence of right hip fracture or dislocation. Arthritic changes are not seen. Enthesopathic change both greater trochanters. There is bilateral patchy moderate iliofemoral calcific arteriosclerosis. Soft tissues are otherwise unremarkable. IMPRESSION: 1. Osteopenia and degenerative change without evidence of right shoulder fracture or dislocation. 2. No evidence of pelvic or right hip fracture or dislocation. 3. Bilateral iliofemoral calcific arteriosclerosis. Electronically Signed    By: Almira Bar M.D.   On: 10/31/2023 20:17    Procedures Procedures    Medications Ordered in ED Medications - No data to display  ED Course/ Medical Decision Making/ A&P                                 Medical Decision Making Amount and/or Complexity of Data Reviewed Labs: ordered. Radiology: ordered.   62 yo M with a chief complaints of right sided pain after a fall.  I am unable to get a very good history from what actually caused the fall.  Patient is aphasic at baseline.  He tells me that he did not strike his head but landed on his right shoulder primarily.  Will obtain a plain film of the right shoulder and right hip.  He had blood work obtained through the triage process, no significant electrolyte abnormalities LFTs and lipase are unremarkable.  No anemia.  Plain film of the right shoulder independently interpreted by me without fracture or dislocation.  Plain film of the right hip independently interpreted by me without fracture or dislocation.  Will discharge the patient home.  PCP follow-up.  9:01 PM:  I have discussed the diagnosis/risks/treatment options with the patient.  Evaluation and diagnostic testing in the emergency department does not suggest an emergent condition requiring admission or immediate intervention beyond what has been performed at this time.  They will follow up with PCP. We also discussed returning to the ED immediately if new or worsening sx occur. We discussed the sx which are most concerning (e.g., sudden worsening pain, fever, inability to tolerate by mouth) that necessitate immediate return. Medications administered to the patient during their visit and any new prescriptions provided to the patient are listed below.  Medications given during this visit Medications - No data to display   The patient appears reasonably screen and/or stabilized for discharge and I doubt any other medical condition or other [facility] requiring further screening,  evaluation, or treatment in the ED at this time prior to discharge.          Final Clinical Impression(s) / ED Diagnoses Final diagnoses:  Acute pain of right shoulder  Fall, initial encounter    Rx / DC Orders ED Discharge Orders     None         Melene Plan, DO 10/31/23 2101

## 2023-10-31 NOTE — Discharge Instructions (Signed)
Follow up with your doctor in the office.  

## 2023-10-31 NOTE — ED Triage Notes (Signed)
Pt dropped off by son. When asked about fall, points to abdomen, right side, knees.

## 2023-10-31 NOTE — ED Provider Triage Note (Signed)
Emergency Medicine Provider Triage Evaluation Note  Todd Estrada , a 62 y.o. male  was evaluated in triage.  Pt complains of fall.  Review of Systems  Positive: unable Negative: unable  Physical Exam  BP 123/84   Pulse 73   Temp 98.7 F (37.1 C) (Oral)   Resp 16   SpO2 100%  Gen:   Awake, no distress   Resp:  Normal effort  MSK:   Moves extremities without difficulty Right hemiparesis Other:  Speech unintelligible   Medical Decision Making  Medically screening exam initiated at 1:59 PM.  Appropriate orders placed.  Todd Estrada was informed that the remainder of the evaluation will be completed by another provider, this initial triage assessment does not replace that evaluation, and the importance of remaining in the ED until their evaluation is complete.  Dropped off by son, patient points to various places that ?hurt. Will need full exam while undressed to determine appropriate imaging orders. Labs done.   Elpidio Anis, PA-C 10/31/23 1400

## 2023-11-06 NOTE — Progress Notes (Deleted)
[name] OFFICE PROGRESS NOTE  Todd Fabian, MD [address] [place] [address]  DIAGNOSIS: Stage IIb (T2 a, N1, M0) non-small cell lung cancer, squamous cell carcinoma presented with left upper lobe lung mass in addition to left hilar adenopathy diagnosed in June 2023.  The patient also has synchronous stage Ia (T1c, N0, M0) non-small cell lung cancer, squamous cell carcinoma presented with right upper lobe lung nodule diagnosed in June 2023. PD-L1 expression was 20%.  PRIOR THERAPY: Concurrent chemoradiation with weekly carboplatin for AUC of 2 and paclitaxel 45 Mg/M2. First dose 07/04/2022. Status post post 5 cycles. Last cycle was given August 01, 2022 with partial response. The patient was lost to follow-up after that cycle.   CURRENT THERAPY: Observation   INTERVAL HISTORY: Todd Estrada 62 y.o. male returns to clinic today for follow-up visit.  The patient was last seen by Dr. Arbutus Estrada on 06/19/2023.  He is accompanied by ***.  The patient has a history of lung cancer for which she underwent concurrent chemoradiation.  He was last seen in the clinic in July 2024 and his CT scan at that time showed some disease progression with some pleural-based right lung mass with questionable chest wall invasion and Dr. Arbutus Estrada recommended referral to radiation oncology for palliative radiation to this area.  He did eventually establish with radiation oncology who arrange for a PET scan.  The findings were more consistent with postradiation inflammation.  Evaluated the patient.  They suspect that the area along the right lung was related to radiation change versus recurrence and they recommended post monitoring with repeat imaging studies.  Patient has prior cholecystectomy with complicated by abscess.  Since last being seen the patient has numerous emergency room visits on 7/14-7/21 for cholecystitis, 7/23 for erythema of the JP drain, 8/1-8/6 for post-surgical infection/sepsis, 8/15  for chest pain, 8/25 for leg pain, 8/31 for leg pain, 9/20 for chest pain, 9/21 for rectal pain, 10/16 for abdominal pain, and 10/23 again for chest pain  He has had numerous CT scans over the last few weeks which show stable findings.   He has a history of prior stroke and has aphasia. He lives with.  He denies any fever, chills, or night sweats.  His breathing is ***.  Chest pain?  Denies any hemoptysis.  Denies any nausea, vomiting, diarrhea, or constipation.  Denies any headache or visual changes.  He is here today for evaluation and to review his scan results.  MEDICAL HISTORY: Past Medical History:  Diagnosis Date   Asthma    Atypical chest pain 08/13/2022   Community acquired pneumonia 09/14/2022   COPD (chronic obstructive pulmonary disease) (HCC)    Essential hypertension 08/19/2021   GERD (gastroesophageal reflux disease)    HAP (hospital-acquired pneumonia) 09/16/2022   History of tracheostomy    03/09/22-04/11/22   HLD (hyperlipidemia)    Hypertension    Hypokalemia 08/13/2022   Hypomagnesemia 08/13/2022   Lung cancer (HCC)    PAD (peripheral artery disease) (HCC)    Seizures (HCC) 06/02/2022   Sepsis (HCC) 08/13/2022   Sepsis due to pneumonia (HCC) 04/13/2023   Stroke (HCC) 02/2022    ALLERGIES:  has No Known Allergies.  MEDICATIONS:  Current Outpatient Medications  Medication Sig Dispense Refill   albuterol (PROVENTIL) (2.5 MG/3ML) 0.083% nebulizer solution Take 3 mLs (2.5 mg total) by nebulization every 4 (four) hours as needed for wheezing or shortness of breath. 90 mL 12   apixaban (ELIQUIS) 5 MG  TABS tablet TAKE 1 TABLET(5 MG) BY MOUTH TWICE DAILY (Patient taking differently: Take 5 mg by mouth 2 (two) times daily.) 180 tablet 2   atorvastatin (LIPITOR) 40 MG tablet Take 1 tablet (40 mg total) by mouth daily. 90 tablet 3   budesonide (PULMICORT) 0.5 MG/2ML nebulizer solution NEW PRESCRIPTION REQEUST: BUDESONIDE 0.5 MG/ - USE ONE VIAL TWICE DAILY (Patient not  taking: Reported on 08/19/2023) 180 mL 3   cephALEXin (KEFLEX) 500 MG capsule Take 1 capsule (500 mg total) by mouth 4 (four) times daily. 28 capsule 0   levETIRAcetam (KEPPRA) 500 MG tablet Take 1 tablet (500 mg total) by mouth 2 (two) times daily. 120 tablet 0   midodrine (PROAMATINE) 2.5 MG tablet Take 2.5 mg by mouth 2 (two) times daily with a meal.     Multiple Vitamin (MULTIVITAMIN WITH MINERALS) TABS tablet Take 1 tablet by mouth daily.     oxyCODONE-acetaminophen (PERCOCET) 5-325 MG tablet Take 1 tablet by mouth every 6 (six) hours as needed. 28 tablet 0   oxyCODONE-acetaminophen (PERCOCET/ROXICET) 5-325 MG tablet Take 1 tablet by mouth every 6 (six) hours as needed for severe pain (pain score 7-10). 15 tablet 0   pantoprazole (PROTONIX) 40 MG tablet Take 1 tablet (40 mg total) by mouth daily. 30 tablet 0   sucralfate (CARAFATE) 1 GM/10ML suspension Take 10 mLs (1 g total) by mouth 4 (four) times daily -  with meals and at bedtime. 420 mL 0   Tiotropium Bromide-Olodaterol (STIOLTO RESPIMAT) 2.5-2.5 MCG/ACT AERS NEW PRESCRIPTION REQUEST: STIOLTO 2.5 MCG- INHALE TWO PUFFS BY MOUTH DAILY (Patient taking differently: Inhale 2 puffs into the lungs daily.) 12 g 3   No current facility-administered medications for this visit.    SURGICAL HISTORY:  Past Surgical History:  Procedure Laterality Date   BRONCHIAL BIOPSY  05/30/2022   Procedure: BRONCHIAL BIOPSIES;  Surgeon: Leslye Peer, MD;  Location: [facility] ENDOSCOPY;  Service: Pulmonary;;   BRONCHIAL BRUSHINGS  05/30/2022   Procedure: BRONCHIAL BRUSHINGS;  Surgeon: Leslye Peer, MD;  Location: [facility] ENDOSCOPY;  Service: Pulmonary;;   BRONCHIAL NEEDLE ASPIRATION BIOPSY  05/30/2022   Procedure: BRONCHIAL NEEDLE ASPIRATION BIOPSIES;  Surgeon: Leslye Peer, MD;  Location: [facility] ENDOSCOPY;  Service: Pulmonary;;   BRONCHIAL WASHINGS  05/30/2022   Procedure: BRONCHIAL WASHINGS;  Surgeon: Leslye Peer, MD;  Location: [facility] ENDOSCOPY;  Service: Pulmonary;;    CHOLECYSTECTOMY N/A 07/05/2023   Procedure: LAPAROSCOPIC CHOLECYSTECTOMY WITH INTRAOPERATIVE CHOLANGIOGRAM;  Surgeon: Karie Soda, MD;  Location: [facility] ORS;  Service: General;  Laterality: N/A;   ERCP N/A 07/06/2023   Procedure: ENDOSCOPIC RETROGRADE CHOLANGIOPANCREATOGRAPHY (ERCP);  Surgeon: Vida Rigger, MD;  Location: [facility] ENDOSCOPY;  Service: Gastroenterology;  Laterality: N/A;   ESOPHAGOGASTRODUODENOSCOPY (EGD) WITH PROPOFOL N/A 03/11/2022   Procedure: ESOPHAGOGASTRODUODENOSCOPY (EGD) WITH PROPOFOL;  Surgeon: Violeta Gelinas, MD;  Location: [facility] ENDOSCOPY;  Service: General;  Laterality: N/A;   FIDUCIAL MARKER PLACEMENT  05/30/2022   Procedure: FIDUCIAL MARKER PLACEMENT;  Surgeon: Leslye Peer, MD;  Location: [facility] ENDOSCOPY;  Service: Pulmonary;;   IR ANGIO INTRA EXTRACRAN SEL COM CAROTID INNOMINATE UNI R MOD SED  03/02/2022   IR CT HEAD LTD  03/02/2022   IR PERCUTANEOUS ART THROMBECTOMY/INFUSION INTRACRANIAL INC DIAG ANGIO  03/02/2022   PEG PLACEMENT N/A 03/11/2022   Procedure: PERCUTANEOUS ENDOSCOPIC GASTROSTOMY (PEG) PLACEMENT;  Surgeon: Violeta Gelinas, MD;  Location: [facility] ENDOSCOPY;  Service: General;  Laterality: N/A;   RADIOLOGY WITH ANESTHESIA N/A 03/02/2022   Procedure: IR WITH ANESTHESIA;  Surgeon:  Julieanne Cotton, MD;  Location: [facility] OR;  Service: Radiology;  Laterality: N/A;   REMOVAL OF STONES  07/06/2023   Procedure: REMOVAL OF STONES;  Surgeon: Vida Rigger, MD;  Location: [facility] ENDOSCOPY;  Service: Gastroenterology;;   Dennison Mascot  07/06/2023   Procedure: Dennison Mascot;  Surgeon: Vida Rigger, MD;  Location: [facility] ENDOSCOPY;  Service: Gastroenterology;;   VIDEO BRONCHOSCOPY WITH RADIAL ENDOBRONCHIAL ULTRASOUND  05/30/2022   Procedure: VIDEO BRONCHOSCOPY WITH RADIAL ENDOBRONCHIAL ULTRASOUND;  Surgeon: Leslye Peer, MD;  Location: [facility] ENDOSCOPY;  Service: Pulmonary;;    REVIEW OF SYSTEMS:   Review of Systems  Constitutional: Negative for appetite change, chills, fatigue, fever and  unexpected weight change.  HENT:   Negative for mouth sores, nosebleeds, sore throat and trouble swallowing.   Eyes: Negative for eye problems and icterus.  Respiratory: Negative for cough, hemoptysis, shortness of breath and wheezing.   Cardiovascular: Negative for chest pain and leg swelling.  Gastrointestinal: Negative for abdominal pain, constipation, diarrhea, nausea and vomiting.  Genitourinary: Negative for bladder incontinence, difficulty urinating, dysuria, frequency and hematuria.   Musculoskeletal: Negative for back pain, gait problem, neck pain and neck stiffness.  Skin: Negative for itching and rash.  Neurological: Negative for dizziness, extremity weakness, gait problem, headaches, light-headedness and seizures.  Hematological: Negative for adenopathy. Does not bruise/bleed easily.  Psychiatric/Behavioral: Negative for confusion, depression and sleep disturbance. The patient is not nervous/anxious.     PHYSICAL EXAMINATION:  There were no vitals taken for this visit.  ECOG PERFORMANCE STATUS: {CHL ONC ECOG [id number]  Physical Exam  Constitutional: Oriented to person, place, and time and well-developed, well-nourished, and in no distress. No distress.  HENT:  Head: Normocephalic and atraumatic.  Mouth/Throat: Oropharynx is clear and moist. No oropharyngeal exudate.  Eyes: Conjunctivae are normal. Right eye exhibits no discharge. Left eye exhibits no discharge. No scleral icterus.  Neck: Normal range of motion. Neck supple.  Cardiovascular: Normal rate, regular rhythm, normal heart sounds and intact distal pulses.   Pulmonary/Chest: Effort normal and breath sounds normal. No respiratory distress. No wheezes. No rales.  Abdominal: Soft. Bowel sounds are normal. Exhibits no distension and no mass. There is no tenderness.  Musculoskeletal: Normal range of motion. Exhibits no edema.  Lymphadenopathy:    No cervical adenopathy.  Neurological: Alert and oriented to person,  place, and time. Exhibits normal muscle tone. Gait normal. Coordination normal.  Skin: Skin is warm and dry. No rash noted. Not diaphoretic. No erythema. No pallor.  Psychiatric: Mood, memory and judgment normal.  Vitals reviewed.  LABORATORY DATA: Lab Results  Component Value Date   WBC 7.8 10/31/2023   HGB 13.0 10/31/2023   HCT 41.7 10/31/2023   MCV 95.4 10/31/2023   PLT 146 (L) 10/31/2023      Chemistry      Component Value Date/Time   NA 141 10/31/2023 0905   NA 140 09/09/2022 0908   K 3.4 (L) 10/31/2023 0905   CL 104 10/31/2023 0905   CO2 27 10/31/2023 0905   BUN 15 10/31/2023 0905   BUN 16 09/09/2022 0908   CREATININE 0.76 10/31/2023 0905   CREATININE 0.77 09/13/2023 1020      Component Value Date/Time   CALCIUM 9.6 10/31/2023 0905   ALKPHOS 85 10/31/2023 0905   AST 24 10/31/2023 0905   AST 15 09/13/2023 1020   ALT 19 10/31/2023 0905   ALT 12 09/13/2023 1020   BILITOT 0.6 10/31/2023 0905   BILITOT 0.4 09/13/2023 1020  RADIOGRAPHIC STUDIES:  DG Shoulder Right  Result Date: 10/31/2023 CLINICAL DATA:  Fall injury with right shoulder and right hip pain. EXAM: RIGHT SHOULDER - 2+ VIEW; DG HIP (WITH OR WITHOUT PELVIS) 2-3V RIGHT COMPARISON:  Right shoulder series 10/15/2022, AP pelvis and right hip views 02/08/2023. FINDINGS: Right shoulder, routine three views: There is osteopenia without evidence of fracture or dislocation. There is joint space loss and bulky reactive osteophytes are again noted at the [facility] joint, spurring and joint narrowing inferior glenohumeral joint, and mild cephalad migration of the humeral head suggesting a degree of rotator cuff arthropathy. Chronic spurring is again seen along the greater tuberosity. There are postsurgical changes and scarring in the right upper lobe. Visualized right ribs show no displaced fractures. No right apical pneumothorax. Comparison to the prior study reveals no significant interval change. AP pelvis and AP and  frog-leg right hip views: Normal to slightly osteopenic bone density. No pelvic fracture or diastasis is seen. No evidence of right hip fracture or dislocation. Arthritic changes are not seen. Enthesopathic change both greater trochanters. There is bilateral patchy moderate iliofemoral calcific arteriosclerosis. Soft tissues are otherwise unremarkable. IMPRESSION: 1. Osteopenia and degenerative change without evidence of right shoulder fracture or dislocation. 2. No evidence of pelvic or right hip fracture or dislocation. 3. Bilateral iliofemoral calcific arteriosclerosis. Electronically Signed   By: Almira Bar M.D.   On: 10/31/2023 20:17   DG Hip Unilat W or Wo Pelvis 2-3 Views Right  Result Date: 10/31/2023 CLINICAL DATA:  Fall injury with right shoulder and right hip pain. EXAM: RIGHT SHOULDER - 2+ VIEW; DG HIP (WITH OR WITHOUT PELVIS) 2-3V RIGHT COMPARISON:  Right shoulder series 10/15/2022, AP pelvis and right hip views 02/08/2023. FINDINGS: Right shoulder, routine three views: There is osteopenia without evidence of fracture or dislocation. There is joint space loss and bulky reactive osteophytes are again noted at the [facility] joint, spurring and joint narrowing inferior glenohumeral joint, and mild cephalad migration of the humeral head suggesting a degree of rotator cuff arthropathy. Chronic spurring is again seen along the greater tuberosity. There are postsurgical changes and scarring in the right upper lobe. Visualized right ribs show no displaced fractures. No right apical pneumothorax. Comparison to the prior study reveals no significant interval change. AP pelvis and AP and frog-leg right hip views: Normal to slightly osteopenic bone density. No pelvic fracture or diastasis is seen. No evidence of right hip fracture or dislocation. Arthritic changes are not seen. Enthesopathic change both greater trochanters. There is bilateral patchy moderate iliofemoral calcific arteriosclerosis. Soft tissues are  otherwise unremarkable. IMPRESSION: 1. Osteopenia and degenerative change without evidence of right shoulder fracture or dislocation. 2. No evidence of pelvic or right hip fracture or dislocation. 3. Bilateral iliofemoral calcific arteriosclerosis. Electronically Signed   By: Almira Bar M.D.   On: 10/31/2023 20:17   US SCROTUM W/DOPPLER  Result Date: 10/17/2023 CLINICAL DATA:  Acute testicular pain EXAM: SCROTAL ULTRASOUND DOPPLER ULTRASOUND OF THE TESTICLES TECHNIQUE: Complete ultrasound examination of the testicles, epididymis, and other scrotal structures was performed. Color and spectral Doppler ultrasound were also utilized to evaluate blood flow to the testicles. COMPARISON:  None FINDINGS: Right testicle Measurements: 4.0 x 2.4 x 1.9 cm. The right testicle is enlarged and hypoechoic relative to the left testicle, though I do not see any focal abnormality or mass lesion. Left testicle Measurements: 2.6 x 3.1 x 1.9 cm. No mass or microlithiasis visualized. Right epididymis: Incidental 5 mm epididymal cyst, of doubtful clinical  significance. Otherwise unremarkable appearance of the right epididymis. Left epididymis:  Normal in size and appearance. Hydrocele:  Trace bilateral hydroceles, right greater than left. Varicocele:  None visualized. Pulsed Doppler interrogation of both testes demonstrates normal low resistance arterial and venous waveforms within the left testicle. On the right, there is decreased vascularity based on color flow, though normal arterial and venous waveforms are identified. IMPRESSION: 1. Asymmetric enlargement the right testicle, which is hypoechoic and demonstrates decreased vascularity on color flow imaging. Despite the presence of detectable arterial and venous waveforms, findings are suspicious for right testicular torsion. 2. Trace bilateral hydroceles. Critical Value/emergent results were called by telephone at the time of interpretation on 10/17/2023 at 11:25 pm to  provider ANDREW TEE , who verbally acknowledged these results. Electronically Signed   By: Sharlet Salina M.D.   On: 10/17/2023 23:28     ASSESSMENT/PLAN:  This is a very pleasant 62 year old Caucasian male with history of stage IIb (T2a, N1, M0) non-small cell lung cancer, squamous cell carcinoma.  He presented with a left upper lobe lung mass in addition to left hilar adenopathy.  He was diagnosed in June 2023.  He also had synchronous stage Ia (T1c, N0, M0) non-small cell lung cancer, squamous cell carcinoma.  He presented with a right upper lobe lung nodule.  He was diagnosed in June 2023.  His PD-L1 expression is 20%.  He underwent a course of concurrent chemoradiation with weekly carboplatin for an AUC of 2 and paclitaxel 45 mg/m.  He status post 5 cycles.  His last dose was given on 08/01/2022.  The patient was last seen by Dr. Arbutus Estrada in July.  There was some concern about disease recurrence.  He ended up having a PET scan which the findings were most consistent with radiation inflammation.  Recommended short-term interval follow-up CT scan.  Unfortunately the patient was seen in the hospital and emergency room on several occasions since his last appointment.  The patient was seen with Dr. Arbutus Estrada today.  Dr. Arbutus Estrada personally and independently reviewed his most recent CT scan.  His scan showed ***commend that he continue on observation with a repeat CT scan of the chest in 3 months.  We will see him back for follow-up visit 1 week after his CT scan to review the results.  The patient was advised to call immediately if he has any concerning symptoms in the interval. The patient voices understanding of current disease status and treatment options and is in agreement with the current care plan. All questions were answered. The patient knows to call the clinic with any problems, questions or concerns. We can certainly see the patient much sooner if necessary   No orders of the defined  types were placed in this encounter.    I spent {CHL ONC TIME VISIT - ZOXWR:[id number]} counseling the patient face to face. The total time spent in the appointment was {CHL ONC TIME VISIT - BJYNW:[id number]}.  Manjot Beumer L Patra Gherardi, PA-C 11/06/23

## 2023-11-07 ENCOUNTER — Emergency Department (HOSPITAL_COMMUNITY): Payer: Medicaid Other

## 2023-11-07 ENCOUNTER — Emergency Department (HOSPITAL_COMMUNITY)
Admission: EM | Admit: 2023-11-07 | Discharge: 2023-11-08 | Disposition: A | Discharge status: Home / Self Care | Payer: Medicaid Other | Attending: Emergency Medicine | Admitting: Emergency Medicine

## 2023-11-07 ENCOUNTER — Encounter (HOSPITAL_COMMUNITY): Payer: Self-pay | Admitting: Emergency Medicine

## 2023-11-07 ENCOUNTER — Other Ambulatory Visit: Payer: Self-pay

## 2023-11-07 DIAGNOSIS — K6289 Other specified diseases of anus and rectum: Secondary | ICD-10-CM | POA: Insufficient documentation

## 2023-11-07 DIAGNOSIS — Z7901 Long term (current) use of anticoagulants: Secondary | ICD-10-CM | POA: Insufficient documentation

## 2023-11-07 DIAGNOSIS — M79604 Pain in right leg: Secondary | ICD-10-CM | POA: Diagnosis present

## 2023-11-07 NOTE — ED Triage Notes (Signed)
Patient was dropped off by cousin stating that patient was complaining about a pain to his buttocks that started today. Cousin states that patient sits a lot so he believes it is related to that.

## 2023-11-07 NOTE — ED Provider Triage Note (Signed)
Emergency Medicine Provider Triage Evaluation Note  Todd Estrada , a 62 y.o. male  was evaluated in triage.  Pt was dropped off by his cousin supposedly for pain in his buttocks.  The patient has aphasia from prior stroke and is not able to provide many details regarding his complaint.  Will attempt to get patient seen in the back.  On chart review, patient has documented history of constipation.  Will collect upright image of abdomen to assess.  Review of Systems  Positive:  Negative:   Physical Exam  BP 104/70 (BP Location: Left Arm)   Pulse 94   Temp 98.5 F (36.9 C) (Oral)   Resp 16   SpO2 95%  Gen:   Awake, no distress   Resp:  Normal effort  MSK:   Moves extremities without difficulty  Other:    Medical Decision Making  Medically screening exam initiated at 7:31 PM.  Appropriate orders placed.  Todd Estrada was informed that the remainder of the evaluation will be completed by another provider, this initial triage assessment does not replace that evaluation, and the importance of remaining in the ED until their evaluation is complete.     Todd Decant, PA-C 11/07/23 1932

## 2023-11-08 ENCOUNTER — Emergency Department (HOSPITAL_COMMUNITY): Payer: Medicaid Other

## 2023-11-08 ENCOUNTER — Telehealth: Payer: Self-pay | Admitting: Physician Assistant

## 2023-11-08 ENCOUNTER — Emergency Department (HOSPITAL_BASED_OUTPATIENT_CLINIC_OR_DEPARTMENT_OTHER): Payer: Medicaid Other

## 2023-11-08 DIAGNOSIS — M79661 Pain in right lower leg: Secondary | ICD-10-CM | POA: Diagnosis not present

## 2023-11-08 LAB — CBC WITH DIFFERENTIAL/PLATELET
Abs Immature Granulocytes: 0.02 10*3/uL (ref 0.00–0.07)
Basophils Absolute: 0 10*3/uL (ref 0.0–0.1)
Basophils Relative: 0 %
Eosinophils Absolute: 0.1 10*3/uL (ref 0.0–0.5)
Eosinophils Relative: 2 %
HCT: 41.6 % (ref 39.0–52.0)
Hemoglobin: 13.6 g/dL (ref 13.0–17.0)
Immature Granulocytes: 0 %
Lymphocytes Relative: 21 %
Lymphs Abs: 1.2 10*3/uL (ref 0.7–4.0)
MCH: 30.5 pg (ref 26.0–34.0)
MCHC: 32.7 g/dL (ref 30.0–36.0)
MCV: 93.3 fL (ref 80.0–100.0)
Monocytes Absolute: 0.6 10*3/uL (ref 0.1–1.0)
Monocytes Relative: 10 %
Neutro Abs: 3.9 10*3/uL (ref 1.7–7.7)
Neutrophils Relative %: 67 %
Platelets: 133 10*3/uL — ABNORMAL LOW (ref 150–400)
RBC: 4.46 MIL/uL (ref 4.22–5.81)
RDW: 13.3 % (ref 11.5–15.5)
WBC: 5.8 10*3/uL (ref 4.0–10.5)
nRBC: 0 % (ref 0.0–0.2)

## 2023-11-08 LAB — BASIC METABOLIC PANEL
Anion gap: 10 (ref 5–15)
BUN: 18 mg/dL (ref 8–23)
CO2: 28 mmol/L (ref 22–32)
Calcium: 9.5 mg/dL (ref 8.9–10.3)
Chloride: 103 mmol/L (ref 98–111)
Creatinine, Ser: 0.9 mg/dL (ref 0.61–1.24)
GFR, Estimated: >60 mL/min (ref >60)
Glucose, Bld: 105 mg/dL — ABNORMAL HIGH (ref 70–99)
Potassium: 3.4 mmol/L — ABNORMAL LOW (ref 3.5–5.1)
Sodium: 141 mmol/L (ref 135–145)

## 2023-11-08 LAB — D-DIMER, QUANTITATIVE: D-Dimer, Quant: 1.15 ug{FEU}/mL — ABNORMAL HIGH (ref 0.00–0.50)

## 2023-11-08 NOTE — Progress Notes (Deleted)
Patient Care Team: [name], MD as PCP - General Christell Constant, MD as PCP - Cardiology (Cardiology)   CHIEF COMPLAINT: Follow up stage IIB NSCLC of LUL and synchronous Stage IA NSCLC of RUL   PRIOR THERAPY: Concurrent chemoradiation with weekly carboplatin for AUC of 2 and paclitaxel 45 Mg/M2.  First dose 07/04/2022.  Status post post 5 cycles.  Last cycle was given August 01, 2022 with partial response.  The patient was lost to follow-up after that cycle.  05/2023 progression with pleural based R lung mass and questionable chest wall invasion, essentially resolved on subsequent imaging   CURRENT THERAPY: Observation  INTERVAL HISTORY Todd Estrada returns for follow up as scheduled. Last seen by Dr. Arbutus Ped 06/19/23.   ROS   Past Medical History:  Diagnosis Date   Asthma    Atypical chest pain 08/13/2022   Community acquired pneumonia 09/14/2022   COPD (chronic obstructive pulmonary disease) (HCC)    Essential hypertension 08/19/2021   GERD (gastroesophageal reflux disease)    HAP (hospital-acquired pneumonia) 09/16/2022   History of tracheostomy    03/09/22-04/11/22   HLD (hyperlipidemia)    Hypertension    Hypokalemia 08/13/2022   Hypomagnesemia 08/13/2022   Lung cancer (HCC)    PAD (peripheral artery disease) (HCC)    Seizures (HCC) 06/02/2022   Sepsis (HCC) 08/13/2022   Sepsis due to pneumonia (HCC) 04/13/2023   Stroke (HCC) 02/2022     Past Surgical History:  Procedure Laterality Date   BRONCHIAL BIOPSY  05/30/2022   Procedure: BRONCHIAL BIOPSIES;  Surgeon: Leslye Peer, MD;  Location: [facility] ENDOSCOPY;  Service: Pulmonary;;   BRONCHIAL BRUSHINGS  05/30/2022   Procedure: BRONCHIAL BRUSHINGS;  Surgeon: Leslye Peer, MD;  Location: [facility] ENDOSCOPY;  Service: Pulmonary;;   BRONCHIAL NEEDLE ASPIRATION BIOPSY  05/30/2022   Procedure: BRONCHIAL NEEDLE ASPIRATION BIOPSIES;  Surgeon: Leslye Peer, MD;  Location: [facility] ENDOSCOPY;  Service: Pulmonary;;   BRONCHIAL  WASHINGS  05/30/2022   Procedure: BRONCHIAL WASHINGS;  Surgeon: Leslye Peer, MD;  Location: [facility] ENDOSCOPY;  Service: Pulmonary;;   CHOLECYSTECTOMY N/A 07/05/2023   Procedure: LAPAROSCOPIC CHOLECYSTECTOMY WITH INTRAOPERATIVE CHOLANGIOGRAM;  Surgeon: Karie Soda, MD;  Location: [facility] ORS;  Service: General;  Laterality: N/A;   ERCP N/A 07/06/2023   Procedure: ENDOSCOPIC RETROGRADE CHOLANGIOPANCREATOGRAPHY (ERCP);  Surgeon: Vida Rigger, MD;  Location: [facility] ENDOSCOPY;  Service: Gastroenterology;  Laterality: N/A;   ESOPHAGOGASTRODUODENOSCOPY (EGD) WITH PROPOFOL N/A 03/11/2022   Procedure: ESOPHAGOGASTRODUODENOSCOPY (EGD) WITH PROPOFOL;  Surgeon: Violeta Gelinas, MD;  Location: [facility] ENDOSCOPY;  Service: General;  Laterality: N/A;   FIDUCIAL MARKER PLACEMENT  05/30/2022   Procedure: FIDUCIAL MARKER PLACEMENT;  Surgeon: Leslye Peer, MD;  Location: [facility] ENDOSCOPY;  Service: Pulmonary;;   IR ANGIO INTRA EXTRACRAN SEL COM CAROTID INNOMINATE UNI R MOD SED  03/02/2022   IR CT HEAD LTD  03/02/2022   IR PERCUTANEOUS ART THROMBECTOMY/INFUSION INTRACRANIAL INC DIAG ANGIO  03/02/2022   PEG PLACEMENT N/A 03/11/2022   Procedure: PERCUTANEOUS ENDOSCOPIC GASTROSTOMY (PEG) PLACEMENT;  Surgeon: Violeta Gelinas, MD;  Location: [facility] ENDOSCOPY;  Service: General;  Laterality: N/A;   RADIOLOGY WITH ANESTHESIA N/A 03/02/2022   Procedure: IR WITH ANESTHESIA;  Surgeon: Julieanne Cotton, MD;  Location: [facility] OR;  Service: Radiology;  Laterality: N/A;   REMOVAL OF STONES  07/06/2023   Procedure: REMOVAL OF STONES;  Surgeon: Vida Rigger, MD;  Location: [facility] ENDOSCOPY;  Service: Gastroenterology;;   Dennison Mascot  07/06/2023   Procedure: Dennison Mascot;  Surgeon: Vida Rigger, MD;  Location: [facility] ENDOSCOPY;  Service: Gastroenterology;;   VIDEO BRONCHOSCOPY WITH RADIAL ENDOBRONCHIAL ULTRASOUND  05/30/2022   Procedure: VIDEO BRONCHOSCOPY WITH RADIAL ENDOBRONCHIAL ULTRASOUND;  Surgeon: Leslye Peer, MD;  Location: [facility] ENDOSCOPY;  Service:  Pulmonary;;     Outpatient Encounter Medications as of 11/09/2023  Medication Sig   albuterol (PROVENTIL) (2.5 MG/3ML) 0.083% nebulizer solution Take 3 mLs (2.5 mg total) by nebulization every 4 (four) hours as needed for wheezing or shortness of breath.   apixaban (ELIQUIS) 5 MG TABS tablet TAKE 1 TABLET(5 MG) BY MOUTH TWICE DAILY (Patient taking differently: Take 5 mg by mouth 2 (two) times daily.)   atorvastatin (LIPITOR) 40 MG tablet Take 1 tablet (40 mg total) by mouth daily.   budesonide (PULMICORT) 0.5 MG/2ML nebulizer solution NEW PRESCRIPTION REQEUST: BUDESONIDE 0.5 MG/ - USE ONE VIAL TWICE DAILY (Patient not taking: Reported on 08/19/2023)   cephALEXin (KEFLEX) 500 MG capsule Take 1 capsule (500 mg total) by mouth 4 (four) times daily.   levETIRAcetam (KEPPRA) 500 MG tablet Take 1 tablet (500 mg total) by mouth 2 (two) times daily.   midodrine (PROAMATINE) 2.5 MG tablet Take 2.5 mg by mouth 2 (two) times daily with a meal.   Multiple Vitamin (MULTIVITAMIN WITH MINERALS) TABS tablet Take 1 tablet by mouth daily.   oxyCODONE-acetaminophen (PERCOCET) 5-325 MG tablet Take 1 tablet by mouth every 6 (six) hours as needed.   oxyCODONE-acetaminophen (PERCOCET/ROXICET) 5-325 MG tablet Take 1 tablet by mouth every 6 (six) hours as needed for severe pain (pain score 7-10).   pantoprazole (PROTONIX) 40 MG tablet Take 1 tablet (40 mg total) by mouth daily.   sucralfate (CARAFATE) 1 GM/10ML suspension Take 10 mLs (1 g total) by mouth 4 (four) times daily -  with meals and at bedtime.   Tiotropium Bromide-Olodaterol (STIOLTO RESPIMAT) 2.5-2.5 MCG/ACT AERS NEW PRESCRIPTION REQUEST: STIOLTO 2.5 MCG- INHALE TWO PUFFS BY MOUTH DAILY (Patient taking differently: Inhale 2 puffs into the lungs daily.)   No facility-administered encounter medications on file as of 11/09/2023.     There were no vitals filed for this visit. There is no height or weight on file to calculate BMI.   PHYSICAL  EXAM GENERAL:alert, no distress and comfortable SKIN: no rash  EYES: sclera clear NECK: without mass LYMPH:  no palpable cervical or supraclavicular lymphadenopathy  LUNGS: clear with normal breathing effort HEART: regular rate & rhythm, no lower extremity edema ABDOMEN: abdomen soft, non-tender and normal bowel sounds NEURO: alert & oriented x 3 with fluent speech, no focal motor/sensory deficits Breast exam:  PAC without erythema    CBC    Component Value Date/Time   WBC 5.8 11/08/2023 0430   RBC 4.46 11/08/2023 0430   HGB 13.6 11/08/2023 0430   HGB 12.0 (L) 09/13/2023 1020   HGB 10.4 (L) 09/09/2022 0908   HCT 41.6 11/08/2023 0430   HCT 31.5 (L) 09/09/2022 0908   PLT 133 (L) 11/08/2023 0430   PLT 139 (L) 09/13/2023 1020   PLT 428 09/09/2022 0908   MCV 93.3 11/08/2023 0430   MCV 92 09/09/2022 0908   MCH 30.5 11/08/2023 0430   MCHC 32.7 11/08/2023 0430   RDW 13.3 11/08/2023 0430   RDW 14.7 09/09/2022 0908   LYMPHSABS 1.2 11/08/2023 0430   LYMPHSABS 0.6 (L) 09/09/2022 0908   MONOABS 0.6 11/08/2023 0430   EOSABS 0.1 11/08/2023 0430   EOSABS 0.1 09/09/2022 0908   BASOSABS 0.0 11/08/2023 0430   BASOSABS 0.1 09/09/2022 0908  CMP     Component Value Date/Time   NA 141 11/08/2023 0430   NA 140 09/09/2022 0908   K 3.4 (L) 11/08/2023 0430   CL 103 11/08/2023 0430   CO2 28 11/08/2023 0430   GLUCOSE 105 (H) 11/08/2023 0430   BUN 18 11/08/2023 0430   BUN 16 09/09/2022 0908   CREATININE 0.90 11/08/2023 0430   CREATININE 0.77 09/13/2023 1020   CALCIUM 9.5 11/08/2023 0430   PROT 7.6 10/31/2023 0905   ALBUMIN 4.3 10/31/2023 0905   AST 24 10/31/2023 0905   AST 15 09/13/2023 1020   ALT 19 10/31/2023 0905   ALT 12 09/13/2023 1020   ALKPHOS 85 10/31/2023 0905   BILITOT 0.6 10/31/2023 0905   BILITOT 0.4 09/13/2023 1020   GFRNONAA >60 11/08/2023 0430   GFRNONAA >60 09/13/2023 1020     ASSESSMENT & PLAN:  PLAN:  No orders of the defined types were placed in this  encounter.     All questions were answered. The patient knows to call the clinic with any problems, questions or concerns. No barriers to learning were detected. I spent *** counseling the patient face to face. The total time spent in the appointment was *** and more than 50% was on counseling, review of test results, and coordination of care.   Todd Glad, NP-C @DATE @

## 2023-11-08 NOTE — Discharge Instructions (Addendum)
There is some decreased flow in your leg.  Follow-up with vascular surgery for further workup.  Also follow-up with your primary care doctor.

## 2023-11-08 NOTE — Telephone Encounter (Signed)
Called the patient to confirm rescheduled appointment time.

## 2023-11-08 NOTE — ED Provider Notes (Signed)
  Physical Exam  BP 119/81   Pulse 85   Temp 98.7 F (37.1 C) (Oral)   Resp 16   SpO2 95%   Physical Exam  Procedures  Procedures  ED Course / MDM    Medical Decision Making Amount and/or Complexity of Data Reviewed Labs: ordered. Radiology: ordered.   Received in signout.  Leg pain.  Doppler been pending.  It was negative.  However does have some arterial narrowing and decreased flow although does have distal flow.  Potentially could cause some pain but does not appear acutely obstructed.  Will have follow-up with vascular.  Will discharge home.       Benjiman Core, MD 11/08/23 [phone]

## 2023-11-08 NOTE — Progress Notes (Signed)
RLE venous duplex has been completed.  Preliminary results given to Dr. Rubin Payor.   Results can be found under chart review under CV PROC. 11/08/2023 10:46 AM Briane Birden RVT, RDMS

## 2023-11-08 NOTE — ED Provider Notes (Signed)
[place] EMERGENCY DEPARTMENT AT [facility] Provider Note   CSN: [id number] Arrival date & time: 11/07/23  1823     History  Chief Complaint  Patient presents with   Rectal Pain   Leg Pain    Todd Estrada is a 62 y.o. male.  Patient was dropped off by his cousin.  Patient reports that he has pain in his right leg, mostly in the lower leg area.       Home Medications Prior to Admission medications   Medication Sig Start Date End Date Taking? Authorizing Provider  albuterol (PROVENTIL) (2.5 MG/3ML) 0.083% nebulizer solution Take 3 mLs (2.5 mg total) by nebulization every 4 (four) hours as needed for wheezing or shortness of breath. 08/16/22   Steffanie Rainwater, MD  apixaban (ELIQUIS) 5 MG TABS tablet TAKE 1 TABLET(5 MG) BY MOUTH TWICE DAILY Patient taking differently: Take 5 mg by mouth 2 (two) times daily. 04/11/23   Briscoe Burns, MD  atorvastatin (LIPITOR) 40 MG tablet Take 1 tablet (40 mg total) by mouth daily. 01/03/23 01/03/24  Briscoe Burns, MD  budesonide (PULMICORT) 0.5 MG/2ML nebulizer solution NEW PRESCRIPTION REQEUST: BUDESONIDE 0.5 MG/ - USE ONE VIAL TWICE DAILY Patient not taking: Reported on 08/19/2023 11/29/22   Martina Sinner, MD  cephALEXin (KEFLEX) 500 MG capsule Take 1 capsule (500 mg total) by mouth 4 (four) times daily. 10/17/23   Durwin Glaze, MD  levETIRAcetam (KEPPRA) 500 MG tablet Take 1 tablet (500 mg total) by mouth 2 (two) times daily. 10/30/23   Ihor Austin, NP  midodrine (PROAMATINE) 2.5 MG tablet Take 2.5 mg by mouth 2 (two) times daily with a meal.    [provider]  Multiple Vitamin (MULTIVITAMIN WITH MINERALS) TABS tablet Take 1 tablet by mouth daily.    [provider]  oxyCODONE-acetaminophen (PERCOCET) 5-325 MG tablet Take 1 tablet by mouth every 6 (six) hours as needed. 09/20/23   Atway, Rayann N, DO  oxyCODONE-acetaminophen (PERCOCET/ROXICET) 5-325 MG tablet Take 1 tablet by mouth every 6 (six)  hours as needed for severe pain (pain score 7-10). 10/17/23   Durwin Glaze, MD  pantoprazole (PROTONIX) 40 MG tablet Take 1 tablet (40 mg total) by mouth daily. 09/20/23 10/20/23  Atway, Rayann N, DO  sucralfate (CARAFATE) 1 GM/10ML suspension Take 10 mLs (1 g total) by mouth 4 (four) times daily -  with meals and at bedtime. 07/02/23   Nira Conn, MD  Tiotropium Bromide-Olodaterol (STIOLTO RESPIMAT) 2.5-2.5 MCG/ACT AERS NEW PRESCRIPTION REQUEST: STIOLTO 2.5 MCG- INHALE TWO PUFFS BY MOUTH DAILY Patient taking differently: Inhale 2 puffs into the lungs daily. 11/29/22   Martina Sinner, MD      Allergies    Patient has no known allergies.    Review of Systems   Review of Systems  Physical Exam Updated Vital Signs BP 130/79 (BP Location: Left Arm)   Pulse 69   Temp 98.1 F (36.7 C) (Oral)   Resp 17   SpO2 99%  Physical Exam Vitals and nursing note reviewed.  Constitutional:      General: He is not in acute distress.    Appearance: He is well-developed.  HENT:     Head: Normocephalic and atraumatic.     Mouth/Throat:     Mouth: Mucous membranes are moist.  Eyes:     General: Vision grossly intact. Gaze aligned appropriately.     Extraocular Movements: Extraocular movements intact.     Conjunctiva/sclera: Conjunctivae  normal.  Cardiovascular:     Rate and Rhythm: Normal rate and regular rhythm.     Pulses:          Dorsalis pedis pulses are 1+ on the right side and 1+ on the left side.     Heart sounds: Normal heart sounds, S1 normal and S2 normal. No murmur heard.    No friction rub. No gallop.  Pulmonary:     Effort: Pulmonary effort is normal. No respiratory distress.     Breath sounds: Normal breath sounds.  Abdominal:     Palpations: Abdomen is soft.     Tenderness: There is no abdominal tenderness. There is no guarding or rebound.     Hernia: No hernia is present.  Musculoskeletal:        General: No swelling.     Cervical back: Full passive range of  motion without pain, normal range of motion and neck supple. No pain with movement, spinous process tenderness or muscular tenderness. Normal range of motion.     Right hip: Normal.     Right knee: Normal.     Right lower leg: Tenderness present. No swelling or deformity.     Left lower leg: No edema.  Skin:    General: Skin is warm and dry.     Capillary Refill: Capillary refill takes less than 2 seconds.     Findings: No ecchymosis, erythema, lesion or wound.  Neurological:     Mental Status: He is alert and oriented to person, place, and time.     GCS: GCS eye subscore is 4. GCS verbal subscore is 5. GCS motor subscore is 6.     Cranial Nerves: Cranial nerves 2-12 are intact.     Sensory: Sensation is intact.     Motor: Motor function is intact. No weakness or abnormal muscle tone.     Coordination: Coordination is intact.  Psychiatric:        Mood and Affect: Mood normal.        Speech: Speech normal.        Behavior: Behavior normal.     ED Results / Procedures / Treatments   Labs (all labs ordered are listed, but only abnormal results are displayed) Labs Reviewed  CBC WITH DIFFERENTIAL/PLATELET - Abnormal; Notable for the following components:      Result Value   Platelets 133 (*)    All other components within normal limits  BASIC METABOLIC PANEL - Abnormal; Notable for the following components:   Potassium 3.4 (*)    Glucose, Bld 105 (*)    All other components within normal limits  D-DIMER, QUANTITATIVE - Abnormal; Notable for the following components:   D-Dimer, Quant 1.15 (*)    All other components within normal limits    EKG None  Radiology DG Tibia/Fibula Right  Result Date: 11/08/2023 CLINICAL DATA:  62 year old male with leg pain after a fall. EXAM: RIGHT TIBIA AND FIBULA - 2 VIEW COMPARISON:  Right ankle and knee series 05/16/2023. FINDINGS: Four views at 0421 hours. Alignment maintained at the right knee and ankle. No acute osseous abnormality  identified. Some soft tissue swelling/edema appears generalized at the calf. No soft tissue gas. No radiopaque foreign body identified. Some calcified peripheral vascular disease. IMPRESSION: Soft tissue swelling/edema at the calf. No acute osseous abnormality identified. Electronically Signed   By: Odessa Fleming M.D.   On: 11/08/2023 04:40   DG Abdomen 1 View  Result Date: 11/07/2023 CLINICAL DATA:  Abdominal pain.  Possible constipation. EXAM: ABDOMEN - 1 VIEW COMPARISON:  CT of the abdomen pelvis dated 10/04/2023. FINDINGS: Mild colonic stool burden. No bowel dilatation or evidence of obstruction. Bilateral flank radiopaque densities may represent vascular calcification versus kidney stones. No free air. Right upper quadrant cholecystectomy clips. Osteopenia. No acute osseous pathology. IMPRESSION: Mild colonic stool burden. No bowel obstruction. Electronically Signed   By: Elgie Collard M.D.   On: 11/07/2023 23:01    Procedures Procedures    Medications Ordered in ED Medications - No data to display  ED Course/ Medical Decision Making/ A&P                                 Medical Decision Making Amount and/or Complexity of Data Reviewed Labs: ordered. Radiology: ordered.   Presents to the emergency department for evaluation of leg pain.  Information provided by the patient's cousin when he dropped him off was that he was having pain from his buttock down his leg.  Patient informs me that his pain is mainly in the lower leg in the calf area.  Patient wears a brace on his lower leg.  He has palpable pulses.  Overlying skin is normal, no signs of infection.  D-dimer mildly elevated, will obtain venous duplex.  Will sign out to oncoming ER physician to follow-up on results, anticipate discharge.        Final Clinical Impression(s) / ED Diagnoses Final diagnoses:  Right leg pain    Rx / DC Orders ED Discharge Orders     None         Gilda Crease, MD 11/08/23  [phone]

## 2023-11-09 ENCOUNTER — Other Ambulatory Visit: Payer: Self-pay

## 2023-11-09 ENCOUNTER — Inpatient Hospital Stay: Payer: Medicaid Other

## 2023-11-09 ENCOUNTER — Inpatient Hospital Stay: Payer: Medicaid Other | Admitting: Physician Assistant

## 2023-11-09 ENCOUNTER — Ambulatory Visit: Payer: Medicaid Other | Admitting: Nurse Practitioner

## 2023-11-09 ENCOUNTER — Inpatient Hospital Stay: Payer: Medicaid Other | Attending: Internal Medicine | Admitting: Internal Medicine

## 2023-11-09 ENCOUNTER — Other Ambulatory Visit: Payer: Medicaid Other

## 2023-11-09 ENCOUNTER — Ambulatory Visit: Payer: Medicaid Other | Admitting: Internal Medicine

## 2023-11-09 VITALS — BP 106/75 | HR 78 | Temp 98.5°F | Resp 16 | Ht 72.0 in | Wt 183.7 lb

## 2023-11-09 DIAGNOSIS — C3411 Malignant neoplasm of upper lobe, right bronchus or lung: Secondary | ICD-10-CM | POA: Diagnosis present

## 2023-11-09 DIAGNOSIS — C3490 Malignant neoplasm of unspecified part of unspecified bronchus or lung: Secondary | ICD-10-CM | POA: Diagnosis not present

## 2023-11-09 DIAGNOSIS — Z9221 Personal history of antineoplastic chemotherapy: Secondary | ICD-10-CM | POA: Insufficient documentation

## 2023-11-09 DIAGNOSIS — I739 Peripheral vascular disease, unspecified: Secondary | ICD-10-CM | POA: Diagnosis not present

## 2023-11-09 DIAGNOSIS — C3412 Malignant neoplasm of upper lobe, left bronchus or lung: Secondary | ICD-10-CM | POA: Diagnosis present

## 2023-11-09 DIAGNOSIS — Z923 Personal history of irradiation: Secondary | ICD-10-CM | POA: Insufficient documentation

## 2023-11-09 DIAGNOSIS — Z79899 Other long term (current) drug therapy: Secondary | ICD-10-CM | POA: Diagnosis not present

## 2023-11-09 NOTE — Progress Notes (Signed)
[facility] [phone]   [phone]) [phone]  OFFICE PROGRESS NOTE  [name], MD [address] [place] [address]  DIAGNOSIS:  Stage IIb (T2 a, N1, M0) non-small cell lung cancer, squamous cell carcinoma presented with left upper lobe lung mass in addition to left hilar adenopathy diagnosed in June 2023.  The patient also has synchronous stage Ia (T1c, N0, M0) non-small cell lung cancer, squamous cell carcinoma presented with right upper lobe lung nodule diagnosed in June 2023. PD-L1 expression was 20%.  PRIOR THERAPY: Concurrent chemoradiation with weekly carboplatin for AUC of 2 and paclitaxel 45 Mg/M2.  First dose 07/04/2022.  Status post post 5 cycles.  Last cycle was given August 01, 2022 with partial response.  The patient was lost to follow-up after that cycle.  CURRENT THERAPY: Observation.  INTERVAL HISTORY: Todd Estrada 62 y.o. male returns to the clinic today for follow-up visit accompanied by his nephew.Discussed the use of AI scribe software for clinical note transcription with the patient, who gave verbal consent to proceed.  History of Present Illness   The patient, diagnosed with lung cancer, had a recent scan revealing a new spot in the lower part of the right lung, raising concerns of cancer progression. However, a follow-up scan conducted last month showed resolution of this spot, suggesting it was likely inflammation rather than cancer growth.  The patient has been experiencing leg issues, suspected to be due to a vascular blockage leading to poor circulation. The severity of this issue seems to have increased recently, possibly due to lack of exercise.  Respiratory symptoms include a persistent, albeit not severe, cough. The patient also reports discomfort in the right side of the chest, the site of previous radiation therapy. There have been no episodes of chest pain, hemoptysis, nausea, or vomiting. The patient's weight has been  stable, with a recent gain noted.  The patient's lung cancer, initially involving both lungs and classified as stage four, appears to be under control at present. The treatment has been effective, with the previously identified masses in both lungs showing improvement.      MEDICAL HISTORY: Past Medical History:  Diagnosis Date   Asthma    Atypical chest pain 08/13/2022   Community acquired pneumonia 09/14/2022   COPD (chronic obstructive pulmonary disease) (HCC)    Essential hypertension 08/19/2021   GERD (gastroesophageal reflux disease)    HAP (hospital-acquired pneumonia) 09/16/2022   History of tracheostomy    03/09/22-04/11/22   HLD (hyperlipidemia)    Hypertension    Hypokalemia 08/13/2022   Hypomagnesemia 08/13/2022   Lung cancer (HCC)    PAD (peripheral artery disease) (HCC)    Seizures (HCC) 06/02/2022   Sepsis (HCC) 08/13/2022   Sepsis due to pneumonia (HCC) 04/13/2023   Stroke (HCC) 02/2022    ALLERGIES:  has No Known Allergies.  MEDICATIONS:  Current Outpatient Medications  Medication Sig Dispense Refill   albuterol (PROVENTIL) (2.5 MG/3ML) 0.083% nebulizer solution Take 3 mLs (2.5 mg total) by nebulization every 4 (four) hours as needed for wheezing or shortness of breath. 90 mL 12   apixaban (ELIQUIS) 5 MG TABS tablet TAKE 1 TABLET(5 MG) BY MOUTH TWICE DAILY (Patient taking differently: Take 5 mg by mouth 2 (two) times daily.) 180 tablet 2   atorvastatin (LIPITOR) 40 MG tablet Take 1 tablet (40 mg total) by mouth daily. 90 tablet 3   budesonide (PULMICORT) 0.5 MG/2ML nebulizer solution NEW PRESCRIPTION REQEUST: BUDESONIDE 0.5 MG/  - USE ONE VIAL TWICE DAILY (Patient not taking: Reported on 08/19/2023) 180 mL 3   cephALEXin (KEFLEX) 500 MG capsule Take 1 capsule (500 mg total) by mouth 4 (four) times daily. 28 capsule 0   levETIRAcetam (KEPPRA) 500 MG tablet Take 1 tablet (500 mg total) by mouth 2 (two) times daily. 120 tablet 0   midodrine (PROAMATINE) 2.5 MG  tablet Take 2.5 mg by mouth 2 (two) times daily with a meal.     Multiple Vitamin (MULTIVITAMIN WITH MINERALS) TABS tablet Take 1 tablet by mouth daily.     oxyCODONE-acetaminophen (PERCOCET) 5-325 MG tablet Take 1 tablet by mouth every 6 (six) hours as needed. 28 tablet 0   oxyCODONE-acetaminophen (PERCOCET/ROXICET) 5-325 MG tablet Take 1 tablet by mouth every 6 (six) hours as needed for severe pain (pain score 7-10). 15 tablet 0   pantoprazole (PROTONIX) 40 MG tablet Take 1 tablet (40 mg total) by mouth daily. 30 tablet 0   sucralfate (CARAFATE) 1 GM/10ML suspension Take 10 mLs (1 g total) by mouth 4 (four) times daily -  with meals and at bedtime. 420 mL 0   Tiotropium Bromide-Olodaterol (STIOLTO RESPIMAT) 2.5-2.5 MCG/ACT AERS NEW PRESCRIPTION REQUEST: STIOLTO 2.5 MCG- INHALE TWO PUFFS BY MOUTH DAILY (Patient taking differently: Inhale 2 puffs into the lungs daily.) 12 g 3   No current facility-administered medications for this visit.    SURGICAL HISTORY:  Past Surgical History:  Procedure Laterality Date   BRONCHIAL BIOPSY  05/30/2022   Procedure: BRONCHIAL BIOPSIES;  Surgeon: Leslye Peer, MD;  Location: [facility] ENDOSCOPY;  Service: Pulmonary;;   BRONCHIAL BRUSHINGS  05/30/2022   Procedure: BRONCHIAL BRUSHINGS;  Surgeon: Leslye Peer, MD;  Location: [facility] ENDOSCOPY;  Service: Pulmonary;;   BRONCHIAL NEEDLE ASPIRATION BIOPSY  05/30/2022   Procedure: BRONCHIAL NEEDLE ASPIRATION BIOPSIES;  Surgeon: Leslye Peer, MD;  Location: [facility] ENDOSCOPY;  Service: Pulmonary;;   BRONCHIAL WASHINGS  05/30/2022   Procedure: BRONCHIAL WASHINGS;  Surgeon: Leslye Peer, MD;  Location: [facility] ENDOSCOPY;  Service: Pulmonary;;   CHOLECYSTECTOMY N/A 07/05/2023   Procedure: LAPAROSCOPIC CHOLECYSTECTOMY WITH INTRAOPERATIVE CHOLANGIOGRAM;  Surgeon: Karie Soda, MD;  Location: [facility] ORS;  Service: General;  Laterality: N/A;   ERCP N/A 07/06/2023   Procedure: ENDOSCOPIC RETROGRADE CHOLANGIOPANCREATOGRAPHY (ERCP);  Surgeon:  Vida Rigger, MD;  Location: [facility] ENDOSCOPY;  Service: Gastroenterology;  Laterality: N/A;   ESOPHAGOGASTRODUODENOSCOPY (EGD) WITH PROPOFOL N/A 03/11/2022   Procedure: ESOPHAGOGASTRODUODENOSCOPY (EGD) WITH PROPOFOL;  Surgeon: Violeta Gelinas, MD;  Location: [facility] ENDOSCOPY;  Service: General;  Laterality: N/A;   FIDUCIAL MARKER PLACEMENT  05/30/2022   Procedure: FIDUCIAL MARKER PLACEMENT;  Surgeon: Leslye Peer, MD;  Location: [facility] ENDOSCOPY;  Service: Pulmonary;;   IR ANGIO INTRA EXTRACRAN SEL COM CAROTID INNOMINATE UNI R MOD SED  03/02/2022   IR CT HEAD LTD  03/02/2022   IR PERCUTANEOUS ART THROMBECTOMY/INFUSION INTRACRANIAL INC DIAG ANGIO  03/02/2022   PEG PLACEMENT N/A 03/11/2022   Procedure: PERCUTANEOUS ENDOSCOPIC GASTROSTOMY (PEG) PLACEMENT;  Surgeon: Violeta Gelinas, MD;  Location: [facility] ENDOSCOPY;  Service: General;  Laterality: N/A;   RADIOLOGY WITH ANESTHESIA N/A 03/02/2022   Procedure: IR WITH ANESTHESIA;  Surgeon: Julieanne Cotton, MD;  Location: [facility] OR;  Service: Radiology;  Laterality: N/A;   REMOVAL OF STONES  07/06/2023   Procedure: REMOVAL OF STONES;  Surgeon: Vida Rigger, MD;  Location: [facility] ENDOSCOPY;  Service: Gastroenterology;;   Dennison Mascot  07/06/2023   Procedure: Dennison Mascot;  Surgeon: Vida Rigger, MD;  Location: [facility] ENDOSCOPY;  Service:  Gastroenterology;;   VIDEO BRONCHOSCOPY WITH RADIAL ENDOBRONCHIAL ULTRASOUND  05/30/2022   Procedure: VIDEO BRONCHOSCOPY WITH RADIAL ENDOBRONCHIAL ULTRASOUND;  Surgeon: Leslye Peer, MD;  Location: [facility] ENDOSCOPY;  Service: Pulmonary;;    REVIEW OF SYSTEMS:  Constitutional: positive for fatigue Eyes: negative Ears, nose, mouth, throat, and face: negative Respiratory: positive for dyspnea on exertion Cardiovascular: negative Gastrointestinal: negative Genitourinary:negative Integument/breast: negative Hematologic/lymphatic: negative Musculoskeletal:positive for muscle weakness Neurological: negative Behavioral/Psych: negative Endocrine:  negative Allergic/Immunologic: negative   PHYSICAL EXAMINATION: General appearance: alert, cooperative, fatigued, and no distress Head: Normocephalic, without obvious abnormality, atraumatic Neck: no adenopathy, no JVD, supple, symmetrical, trachea midline, and thyroid not enlarged, symmetric, no tenderness/mass/nodules Lymph nodes: Cervical, supraclavicular, and axillary nodes normal. Resp: clear to auscultation bilaterally Back: symmetric, no curvature. ROM normal. No CVA tenderness. Cardio: regular rate and rhythm, S1, S2 normal, no murmur, click, rub or gallop GI: soft, non-tender; bowel sounds normal; no masses,  no organomegaly Extremities: extremities normal, atraumatic, no cyanosis or edema Neurologic: Alert and oriented X 3, normal strength and tone. Normal symmetric reflexes. Normal coordination and gait  ECOG PERFORMANCE STATUS: 1 - Symptomatic but completely ambulatory  Blood pressure 106/75, pulse 78, temperature 98.5 F (36.9 C), temperature source Temporal, resp. rate 16, height 6' (1.829 m), weight 183 lb 11.2 oz (83.3 kg), SpO2 97%.  LABORATORY DATA: Lab Results  Component Value Date   WBC 5.8 11/08/2023   HGB 13.6 11/08/2023   HCT 41.6 11/08/2023   MCV 93.3 11/08/2023   PLT 133 (L) 11/08/2023      Chemistry      Component Value Date/Time   NA 141 11/08/2023 0430   NA 140 09/09/2022 0908   K 3.4 (L) 11/08/2023 0430   CL 103 11/08/2023 0430   CO2 28 11/08/2023 0430   BUN 18 11/08/2023 0430   BUN 16 09/09/2022 0908   CREATININE 0.90 11/08/2023 0430   CREATININE 0.77 09/13/2023 1020      Component Value Date/Time   CALCIUM 9.5 11/08/2023 0430   ALKPHOS 85 10/31/2023 0905   AST 24 10/31/2023 0905   AST 15 09/13/2023 1020   ALT 19 10/31/2023 0905   ALT 12 09/13/2023 1020   BILITOT 0.6 10/31/2023 0905   BILITOT 0.4 09/13/2023 1020       RADIOGRAPHIC STUDIES: VAS [place] LOWER EXTREMITY VENOUS (DVT)  Result Date: 11/08/2023  Lower Venous DVT Study  Patient Name:  WILLIS IRWIN  Date of Exam:   11/08/2023 Medical Rec #: [id number]      Accession #:    [id number] Date of Birth: 1961-01-07      Patient Gender: M Patient Age:   87 years Exam Location:  [facility] Procedure:      VAS [place] LOWER EXTREMITY VENOUS (DVT) Referring Phys: Cristal Deer POLLINA --------------------------------------------------------------------------------  Indications: Pain.  Limitations: Decreased mobility due to prior CVA. Comparison Study: Previous exams on 05/19/2023 and 03/23/2022 were negative for                   DVT. Patient has a known RLE SFA occlusion and ABI shows                   severe resting ischemia but has not had any interventions and                   continues to have RLE pain. Today, only one vessel run-off  into foot (ATA). Performing Technologist: Ernestene Mention RVT, RDMS  Examination Guidelines: A complete evaluation includes B-mode imaging, spectral Doppler, color Doppler, and power Doppler as needed of all accessible portions of each vessel. Bilateral testing is considered an integral part of a complete examination. Limited examinations for reoccurring indications may be performed as noted. The reflux portion of the exam is performed with the patient in reverse Trendelenburg.  +---------+---------------+---------+-----------+----------+-------------------+ RIGHT    CompressibilityPhasicitySpontaneityPropertiesThrombus Aging      +---------+---------------+---------+-----------+----------+-------------------+ CFV      Full           Yes      Yes                                      +---------+---------------+---------+-----------+----------+-------------------+ SFJ      Full                                                             +---------+---------------+---------+-----------+----------+-------------------+ FV Prox  Full           Yes      Yes                                       +---------+---------------+---------+-----------+----------+-------------------+ FV Mid   Full           Yes      Yes                                      +---------+---------------+---------+-----------+----------+-------------------+ FV DistalFull           Yes      Yes                                      +---------+---------------+---------+-----------+----------+-------------------+ PFV      Full                                                             +---------+---------------+---------+-----------+----------+-------------------+ POP      Full           Yes      Yes                                      +---------+---------------+---------+-----------+----------+-------------------+ PTV      Full                                         Not well visualized +---------+---------------+---------+-----------+----------+-------------------+ PERO     Full  Not well visualized +---------+---------------+---------+-----------+----------+-------------------+   +----+---------------+---------+-----------+----------+--------------+ LEFTCompressibilityPhasicitySpontaneityPropertiesThrombus Aging +----+---------------+---------+-----------+----------+--------------+ CFV Full           Yes      Yes                                 +----+---------------+---------+-----------+----------+--------------+     Summary: RIGHT: - There is no evidence of deep vein thrombosis in the lower extremity.  - No cystic structure found in the popliteal fossa.  LEFT: - No evidence of common femoral vein obstruction.   *See table(s) above for measurements and observations. Electronically signed by Heath Lark on 11/08/2023 at 6:18:35 PM.    Final    DG Tibia/Fibula Right  Result Date: 11/08/2023 CLINICAL DATA:  62 year old male with leg pain after a fall. EXAM: RIGHT TIBIA AND FIBULA - 2 VIEW COMPARISON:  Right ankle and knee series  05/16/2023. FINDINGS: Four views at 0421 hours. Alignment maintained at the right knee and ankle. No acute osseous abnormality identified. Some soft tissue swelling/edema appears generalized at the calf. No soft tissue gas. No radiopaque foreign body identified. Some calcified peripheral vascular disease. IMPRESSION: Soft tissue swelling/edema at the calf. No acute osseous abnormality identified. Electronically Signed   By: Odessa Fleming M.D.   On: 11/08/2023 04:40   DG Abdomen 1 View  Result Date: 11/07/2023 CLINICAL DATA:  Abdominal pain.  Possible constipation. EXAM: ABDOMEN - 1 VIEW COMPARISON:  CT of the abdomen pelvis dated 10/04/2023. FINDINGS: Mild colonic stool burden. No bowel dilatation or evidence of obstruction. Bilateral flank radiopaque densities may represent vascular calcification versus kidney stones. No free air. Right upper quadrant cholecystectomy clips. Osteopenia. No acute osseous pathology. IMPRESSION: Mild colonic stool burden. No bowel obstruction. Electronically Signed   By: Elgie Collard M.D.   On: 11/07/2023 23:01   DG Shoulder Right  Result Date: 10/31/2023 CLINICAL DATA:  Fall injury with right shoulder and right hip pain. EXAM: RIGHT SHOULDER - 2+ VIEW; DG HIP (WITH OR WITHOUT PELVIS) 2-3V RIGHT COMPARISON:  Right shoulder series 10/15/2022, AP pelvis and right hip views 02/08/2023. FINDINGS: Right shoulder, routine three views: There is osteopenia without evidence of fracture or dislocation. There is joint space loss and bulky reactive osteophytes are again noted at the [facility] joint, spurring and joint narrowing inferior glenohumeral joint, and mild cephalad migration of the humeral head suggesting a degree of rotator cuff arthropathy. Chronic spurring is again seen along the greater tuberosity. There are postsurgical changes and scarring in the right upper lobe. Visualized right ribs show no displaced fractures. No right apical pneumothorax. Comparison to the prior study reveals  no significant interval change. AP pelvis and AP and frog-leg right hip views: Normal to slightly osteopenic bone density. No pelvic fracture or diastasis is seen. No evidence of right hip fracture or dislocation. Arthritic changes are not seen. Enthesopathic change both greater trochanters. There is bilateral patchy moderate iliofemoral calcific arteriosclerosis. Soft tissues are otherwise unremarkable. IMPRESSION: 1. Osteopenia and degenerative change without evidence of right shoulder fracture or dislocation. 2. No evidence of pelvic or right hip fracture or dislocation. 3. Bilateral iliofemoral calcific arteriosclerosis. Electronically Signed   By: Almira Bar M.D.   On: 10/31/2023 20:17   DG Hip Unilat W or Wo Pelvis 2-3 Views Right  Result Date: 10/31/2023 CLINICAL DATA:  Fall injury with right shoulder and right hip pain. EXAM: RIGHT SHOULDER - 2+ VIEW; DG HIP (WITH OR WITHOUT PELVIS)  2-3V RIGHT COMPARISON:  Right shoulder series 10/15/2022, AP pelvis and right hip views 02/08/2023. FINDINGS: Right shoulder, routine three views: There is osteopenia without evidence of fracture or dislocation. There is joint space loss and bulky reactive osteophytes are again noted at the [facility] joint, spurring and joint narrowing inferior glenohumeral joint, and mild cephalad migration of the humeral head suggesting a degree of rotator cuff arthropathy. Chronic spurring is again seen along the greater tuberosity. There are postsurgical changes and scarring in the right upper lobe. Visualized right ribs show no displaced fractures. No right apical pneumothorax. Comparison to the prior study reveals no significant interval change. AP pelvis and AP and frog-leg right hip views: Normal to slightly osteopenic bone density. No pelvic fracture or diastasis is seen. No evidence of right hip fracture or dislocation. Arthritic changes are not seen. Enthesopathic change both greater trochanters. There is bilateral patchy moderate  iliofemoral calcific arteriosclerosis. Soft tissues are otherwise unremarkable. IMPRESSION: 1. Osteopenia and degenerative change without evidence of right shoulder fracture or dislocation. 2. No evidence of pelvic or right hip fracture or dislocation. 3. Bilateral iliofemoral calcific arteriosclerosis. Electronically Signed   By: Almira Bar M.D.   On: 10/31/2023 20:17   US SCROTUM W/DOPPLER  Result Date: 10/17/2023 CLINICAL DATA:  Acute testicular pain EXAM: SCROTAL ULTRASOUND DOPPLER ULTRASOUND OF THE TESTICLES TECHNIQUE: Complete ultrasound examination of the testicles, epididymis, and other scrotal structures was performed. Color and spectral Doppler ultrasound were also utilized to evaluate blood flow to the testicles. COMPARISON:  None FINDINGS: Right testicle Measurements: 4.0 x 2.4 x 1.9 cm. The right testicle is enlarged and hypoechoic relative to the left testicle, though I do not see any focal abnormality or mass lesion. Left testicle Measurements: 2.6 x 3.1 x 1.9 cm. No mass or microlithiasis visualized. Right epididymis: Incidental 5 mm epididymal cyst, of doubtful clinical significance. Otherwise unremarkable appearance of the right epididymis. Left epididymis:  Normal in size and appearance. Hydrocele:  Trace bilateral hydroceles, right greater than left. Varicocele:  None visualized. Pulsed Doppler interrogation of both testes demonstrates normal low resistance arterial and venous waveforms within the left testicle. On the right, there is decreased vascularity based on color flow, though normal arterial and venous waveforms are identified. IMPRESSION: 1. Asymmetric enlargement the right testicle, which is hypoechoic and demonstrates decreased vascularity on color flow imaging. Despite the presence of detectable arterial and venous waveforms, findings are suspicious for right testicular torsion. 2. Trace bilateral hydroceles. Critical Value/emergent results were called by telephone at the  time of interpretation on 10/17/2023 at 11:25 pm to provider ANDREW TEE , who verbally acknowledged these results. Electronically Signed   By: Sharlet Salina M.D.   On: 10/17/2023 23:28    ASSESSMENT AND PLAN: This is a very pleasant 62 years old white male with  stage IIb (T2 a, N1, M0) non-small cell lung cancer, squamous cell carcinoma presented with left upper lobe lung mass in addition to left hilar adenopathy diagnosed in June 2023.  The patient also has synchronous stage Ia (T1c, N0, M0) non-small cell lung cancer, squamous cell carcinoma presented with right upper lobe lung nodule diagnosed in June 2023. PD-L1 expression was 20%. The patient underwent a course of concurrent chemoradiation with weekly carboplatin for AUC of 2 and paclitaxel 45 Mg/M2 status post 5 cycles. Last dose was giving August 01 2022. He has been tolerating this treatment well with no concerning adverse effects. Unfortunately his scan showed progressive disease with pleural-based right lung mass and  questionable chest wall invasion. I recommended for the patient to see radiation oncology for evaluation and recommendation of palliative radiotherapy to this area.  Lung Cancer Stage IIb (T2 a, N1, M0) non-small cell lung cancer, squamous cell carcinoma presented with left upper lobe lung mass in addition to left hilar adenopathy diagnosed in June 2023.  The patient also has synchronous stage Ia (T1c, N0, M0) non-small cell lung cancer, squamous cell carcinoma presented with right upper lobe lung nodule diagnosed in June 2023. PD-L1 expression was 20%. Lung cancer with previous bilateral involvement. Recent imaging showed a new right lower lung spot, initially concerning for recurrence, but subsequent scans confirmed inflammation. Currently well-managed with no signs of progression. Persistent mild cough, no chest pain, hemoptysis, nausea, vomiting, or weight loss. Gained weight and doing well post-treatment. - Schedule  follow-up scan in three months - Perform scan one week before next appointment  Peripheral Artery Disease (PAD) Leg issues likely due to poor circulation, possibly from a blockage. Previous vascular surgeon assessment indicated non-severe condition, but symptoms appear to have worsened, potentially due to lack of exercise. - Follow up with vascular surgeon for reassessment  General Health Maintenance Monitored at home with caregiver support using a baby monitor. - Continue home monitoring with caregiver support.   The patient was advised to call immediately if he has any other concerning symptoms in the interval. The patient voices understanding of current disease status and treatment options and is in agreement with the current care plan.  All questions were answered. The patient knows to call the clinic with any problems, questions or concerns. We can certainly see the patient much sooner if necessary.  The total time spent in the appointment was 30 minutes.  Disclaimer: This note was dictated with voice recognition software. Similar sounding words can inadvertently be transcribed and may not be corrected upon review.

## 2023-11-16 ENCOUNTER — Encounter (HOSPITAL_COMMUNITY): Payer: Self-pay

## 2023-11-16 ENCOUNTER — Other Ambulatory Visit: Payer: Self-pay

## 2023-11-16 ENCOUNTER — Emergency Department (HOSPITAL_COMMUNITY)
Admission: EM | Admit: 2023-11-16 | Discharge: 2023-11-16 | Disposition: A | Discharge status: Home / Self Care | Payer: Medicaid Other | Attending: Emergency Medicine | Admitting: Emergency Medicine

## 2023-11-16 ENCOUNTER — Emergency Department (HOSPITAL_COMMUNITY): Payer: Medicaid Other

## 2023-11-16 DIAGNOSIS — Z7901 Long term (current) use of anticoagulants: Secondary | ICD-10-CM | POA: Diagnosis not present

## 2023-11-16 DIAGNOSIS — M545 Low back pain, unspecified: Secondary | ICD-10-CM | POA: Insufficient documentation

## 2023-11-16 DIAGNOSIS — W19XXXA Unspecified fall, initial encounter: Secondary | ICD-10-CM

## 2023-11-16 DIAGNOSIS — W06XXXA Fall from bed, initial encounter: Secondary | ICD-10-CM | POA: Diagnosis not present

## 2023-11-16 DIAGNOSIS — Z8673 Personal history of transient ischemic attack (TIA), and cerebral infarction without residual deficits: Secondary | ICD-10-CM | POA: Insufficient documentation

## 2023-11-16 LAB — CBC WITH DIFFERENTIAL/PLATELET
Abs Immature Granulocytes: 0 10*3/uL (ref 0.00–0.07)
Basophils Absolute: 0 10*3/uL (ref 0.0–0.1)
Basophils Relative: 0 %
Eosinophils Absolute: 0.2 10*3/uL (ref 0.0–0.5)
Eosinophils Relative: 3 %
HCT: 40.5 % (ref 39.0–52.0)
Hemoglobin: 13 g/dL (ref 13.0–17.0)
Immature Granulocytes: 0 %
Lymphocytes Relative: 20 %
Lymphs Abs: 1.1 10*3/uL (ref 0.7–4.0)
MCH: 29.7 pg (ref 26.0–34.0)
MCHC: 32.1 g/dL (ref 30.0–36.0)
MCV: 92.7 fL (ref 80.0–100.0)
Monocytes Absolute: 0.6 10*3/uL (ref 0.1–1.0)
Monocytes Relative: 11 %
Neutro Abs: 3.8 10*3/uL (ref 1.7–7.7)
Neutrophils Relative %: 66 %
Platelets: 150 10*3/uL (ref 150–400)
RBC: 4.37 MIL/uL (ref 4.22–5.81)
RDW: 13.1 % (ref 11.5–15.5)
WBC: 5.7 10*3/uL (ref 4.0–10.5)
nRBC: 0 % (ref 0.0–0.2)

## 2023-11-16 LAB — COMPREHENSIVE METABOLIC PANEL
ALT: 16 U/L (ref 0–44)
AST: 20 U/L (ref 15–41)
Albumin: 3.6 g/dL (ref 3.5–5.0)
Alkaline Phosphatase: 84 U/L (ref 38–126)
Anion gap: 8 (ref 5–15)
BUN: 14 mg/dL (ref 8–23)
CO2: 26 mmol/L (ref 22–32)
Calcium: 9.1 mg/dL (ref 8.9–10.3)
Chloride: 106 mmol/L (ref 98–111)
Creatinine, Ser: 0.91 mg/dL (ref 0.61–1.24)
GFR, Estimated: >60 mL/min (ref >60)
Glucose, Bld: 96 mg/dL (ref 70–99)
Potassium: 4.2 mmol/L (ref 3.5–5.1)
Sodium: 140 mmol/L (ref 135–145)
Total Bilirubin: 0.5 mg/dL (ref <1.2)
Total Protein: 6.8 g/dL (ref 6.5–8.1)

## 2023-11-16 LAB — URINALYSIS, ROUTINE W REFLEX MICROSCOPIC
Bilirubin Urine: NEGATIVE
Glucose, UA: NEGATIVE mg/dL
Hgb urine dipstick: NEGATIVE
Ketones, ur: NEGATIVE mg/dL
Leukocytes,Ua: NEGATIVE
Nitrite: NEGATIVE
Protein, ur: NEGATIVE mg/dL
Specific Gravity, Urine: 1.03 (ref 1.005–1.030)
pH: 6 (ref 5.0–8.0)

## 2023-11-16 LAB — LIPASE, BLOOD: Lipase: 29 U/L (ref 11–51)

## 2023-11-16 LAB — TROPONIN I (HIGH SENSITIVITY)
Troponin I (High Sensitivity): 5 ng/L (ref <18)
Troponin I (High Sensitivity): 5 ng/L (ref <18)

## 2023-11-16 MED ORDER — KETOROLAC TROMETHAMINE 15 MG/ML IJ SOLN
15.0000 mg | Freq: Once | INTRAMUSCULAR | Status: AC
  Administered 2023-11-16: 15 mg via INTRAVENOUS
  Filled 2023-11-16: qty 1

## 2023-11-16 MED ORDER — IOHEXOL 350 MG/ML SOLN
75.0000 mL | Freq: Once | INTRAVENOUS | Status: AC | PRN
  Administered 2023-11-16: 75 mL via INTRAVENOUS

## 2023-11-16 NOTE — ED Triage Notes (Signed)
Patient comes in from home with a fall. Only complaint is lower back pain where he hit the bed when he slid down. He has hx of stroke and has right sided deficits and speech deficits. He is on anticoagulants but denies hitting his head.  He is alert and oriented x4. He was able to get to the stretcher himself from the EMS stretcher. No injuries noted by EMS.

## 2023-11-16 NOTE — ED Notes (Signed)
Attempted to call patient's family member but was not able to.

## 2023-11-16 NOTE — ED Notes (Signed)
Patient was able to confirm address and phone number. [facility] will be called.

## 2023-11-16 NOTE — ED Notes (Signed)
Patient able to stand up from his bed. He said he does not walk at baseline but stand pivots. He was able to safely do this with the tech and I.

## 2023-11-16 NOTE — ED Provider Notes (Signed)
[place] EMERGENCY DEPARTMENT AT [facility] Provider Note   CSN: [id number] Arrival date & time: 11/16/23  0028     History  Chief Complaint  Patient presents with   Fall   Back Pain    Todd Estrada is a 62 y.o. male.  Level 5 caveat for expressive aphasia.  Patient here with low back pain after apparently falling out of his bed.  Unclear what made him fall.  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  He does take Eliquis but denies hitting his head.  Complains of pain to his right low back that radiates down his right leg.  Does have chronic right leg weakness and right arm weakness from his previous stroke which is unchanged.  No new weakness, numbness or tingling.  Denies any headache, neck pain, chest pain, shortness of breath. History taking very difficult secondary to his aphasia. No apparent incontinence.  No known fever. Was able to ambulate for EMS.  The history is provided by the patient and the EMS personnel. The history is limited by the condition of the patient.  Fall  Back Pain      Home Medications Prior to Admission medications   Medication Sig Start Date End Date Taking? Authorizing Provider  albuterol (PROVENTIL) (2.5 MG/3ML) 0.083% nebulizer solution Take 3 mLs (2.5 mg total) by nebulization every 4 (four) hours as needed for wheezing or shortness of breath. 08/16/22   Steffanie Rainwater, MD  apixaban (ELIQUIS) 5 MG TABS tablet TAKE 1 TABLET(5 MG) BY MOUTH TWICE DAILY Patient taking differently: Take 5 mg by mouth 2 (two) times daily. 04/11/23   Briscoe Burns, MD  atorvastatin (LIPITOR) 40 MG tablet Take 1 tablet (40 mg total) by mouth daily. 01/03/23 01/03/24  Briscoe Burns, MD  budesonide (PULMICORT) 0.5 MG/2ML nebulizer solution NEW PRESCRIPTION REQEUST: BUDESONIDE 0.5 MG/ - USE ONE VIAL TWICE DAILY Patient not taking: Reported on 08/19/2023 11/29/22   Martina Sinner, MD  cephALEXin (KEFLEX) 500  MG capsule Take 1 capsule (500 mg total) by mouth 4 (four) times daily. 10/17/23   Durwin Glaze, MD  levETIRAcetam (KEPPRA) 500 MG tablet Take 1 tablet (500 mg total) by mouth 2 (two) times daily. 10/30/23   Ihor Austin, NP  midodrine (PROAMATINE) 2.5 MG tablet Take 2.5 mg by mouth 2 (two) times daily with a meal.    [provider]  Multiple Vitamin (MULTIVITAMIN WITH MINERALS) TABS tablet Take 1 tablet by mouth daily.    [provider]  oxyCODONE-acetaminophen (PERCOCET) 5-325 MG tablet Take 1 tablet by mouth every 6 (six) hours as needed. 09/20/23   Atway, Rayann N, DO  oxyCODONE-acetaminophen (PERCOCET/ROXICET) 5-325 MG tablet Take 1 tablet by mouth every 6 (six) hours as needed for severe pain (pain score 7-10). 10/17/23   Durwin Glaze, MD  pantoprazole (PROTONIX) 40 MG tablet Take 1 tablet (40 mg total) by mouth daily. 09/20/23 10/20/23  Atway, Rayann N, DO  sucralfate (CARAFATE) 1 GM/10ML suspension Take 10 mLs (1 g total) by mouth 4 (four) times daily -  with meals and at bedtime. 07/02/23   Nira Conn, MD  Tiotropium Bromide-Olodaterol (STIOLTO RESPIMAT) 2.5-2.5 MCG/ACT AERS NEW PRESCRIPTION REQUEST: STIOLTO 2.5 MCG- INHALE TWO PUFFS BY MOUTH DAILY Patient taking differently: Inhale 2 puffs into the lungs daily. 11/29/22   Martina Sinner, MD      Allergies    Patient has no known allergies.  Review of Systems   Review of Systems  Unable to perform ROS: Other  Musculoskeletal:  Positive for back pain.    Physical Exam Updated Vital Signs BP 125/77 (BP Location: Left Arm)   Pulse 84   Temp 97.6 F (36.4 C) (Oral)   Resp 16   SpO2 98%  Physical Exam Vitals and nursing note reviewed.  Constitutional:      General: He is not in acute distress.    Appearance: He is well-developed. He is not ill-appearing.  HENT:     Head: Normocephalic and atraumatic.     Mouth/Throat:     Pharynx: No oropharyngeal exudate.  Eyes:     Conjunctiva/sclera:  Conjunctivae normal.     Pupils: Pupils are equal, round, and reactive to light.  Neck:     Comments: No meningismus. Cardiovascular:     Rate and Rhythm: Normal rate and regular rhythm.     Heart sounds: Normal heart sounds. No murmur heard. Pulmonary:     Effort: Pulmonary effort is normal. No respiratory distress.     Breath sounds: Normal breath sounds.  Abdominal:     Palpations: Abdomen is soft.     Tenderness: There is no abdominal tenderness. There is no guarding or rebound.  Musculoskeletal:        General: Tenderness present. Normal range of motion.     Cervical back: Normal range of motion and neck supple.     Comments: Midline lumbar spine pain without step-off or deformity  Skin:    General: Skin is warm.  Neurological:     Mental Status: He is alert.     Cranial Nerves: No cranial nerve deficit.     Motor: No abnormal muscle tone.     Coordination: Coordination normal.     Comments: Expressive aphasia, unable to assess orientation.  Right sided weakness to arm and leg which appears to be baseline.  5/5 strength of the left arm and leg.  Equal DP and PT pulses bilaterally.  Psychiatric:        Behavior: Behavior normal.     ED Results / Procedures / Treatments   Labs (all labs ordered are listed, but only abnormal results are displayed) Labs Reviewed  CBC WITH DIFFERENTIAL/PLATELET  COMPREHENSIVE METABOLIC PANEL  LIPASE, BLOOD  URINALYSIS, ROUTINE W REFLEX MICROSCOPIC  TROPONIN I (HIGH SENSITIVITY)  TROPONIN I (HIGH SENSITIVITY)    EKG None  Radiology CT ABDOMEN PELVIS W CONTRAST  Result Date: 11/16/2023 CLINICAL DATA:  62 year old male with history of blunt abdominal trauma. Low back pain. EXAM: CT ABDOMEN AND PELVIS WITH CONTRAST TECHNIQUE: Multidetector CT imaging of the abdomen and pelvis was performed using the standard protocol following bolus administration of intravenous contrast. RADIATION DOSE REDUCTION: This exam was performed according to  the departmental dose-optimization program which includes automated exposure control, adjustment of the mA and/or kV according to patient size and/or use of iterative reconstruction technique. CONTRAST:  75mL OMNIPAQUE IOHEXOL 350 MG/ML SOLN COMPARISON:  CT of the abdomen and pelvis 10/04/2023. FINDINGS: Lower chest: A few scattered tiny calcified granulomas are noted in the lung bases. Atherosclerotic calcifications in the descending thoracic aorta as well as the right coronary artery. Hepatobiliary: No suspicious cystic or solid hepatic lesions. No intra or extrahepatic biliary ductal dilatation. Status post cholecystectomy. Pancreas: No pancreatic mass. No pancreatic ductal dilatation. No pancreatic or peripancreatic fluid collections or inflammatory changes. Spleen: Unremarkable. Adrenals/Urinary Tract: Bilateral kidneys and adrenal glands are normal in appearance. No hydroureteronephrosis. Urinary bladder  is unremarkable in appearance. Stomach/Bowel: Stomach is unremarkable in appearance. No pathologic dilatation of small bowel or colon. Normal appendix. Vascular/Lymphatic: Atherosclerosis in the abdominal aorta and pelvic vasculature, without definite aneurysm or dissection. No lymphadenopathy noted in the abdomen or pelvis. Reproductive: Prostate gland and seminal vesicles are unremarkable in appearance. Other: No significant volume of ascites.  No pneumoperitoneum. Musculoskeletal: There are no aggressive appearing lytic or blastic lesions noted in the visualized portions of the skeleton. IMPRESSION: 1. No acute findings are noted in the abdomen or pelvis to account for the patient's symptoms. 2. Aortic atherosclerosis, in addition to at least right coronary artery disease. Please note that although the presence of coronary artery calcium documents the presence of coronary artery disease, the severity of this disease and any potential stenosis cannot be assessed on this non-gated CT examination. Assessment  for potential risk factor modification, dietary therapy or pharmacologic therapy may be warranted, if clinically indicated. Electronically Signed   By: Trudie Reed M.D.   On: 11/16/2023 05:08   CT Head Wo Contrast  Result Date: 11/16/2023 CLINICAL DATA:  Head trauma, moderate to severe. EXAM: CT HEAD WITHOUT CONTRAST TECHNIQUE: Contiguous axial images were obtained from the base of the skull through the vertex without intravenous contrast. RADIATION DOSE REDUCTION: This exam was performed according to the departmental dose-optimization program which includes automated exposure control, adjustment of the mA and/or kV according to patient size and/or use of iterative reconstruction technique. COMPARISON:  07/21/2023 FINDINGS: Brain: No evidence of acute infarction, hemorrhage, hydrocephalus, extra-axial collection or mass lesion/mass effect. Extensive chronic infarction in the left MCA distribution, especially upper division, with dense encephalomalacia and dystrophic calcification. Chronic left cerebellar infarct. Vascular: No hyperdense vessel or unexpected calcification. Skull: Normal. Negative for fracture or focal lesion. Sinuses/Orbits: Chronic opacification of the right maxillary sinus with atelectasis IMPRESSION: 1. No acute finding. 2. 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 Electronically Signed   By: Tiburcio Pea M.D.   On: 11/16/2023 04:16   CT L-SPINE NO CHARGE  Result Date: 11/16/2023 CLINICAL DATA:  Fall with lower back pain EXAM: CT Lumbar Spine with contrast TECHNIQUE: Technique: Multiplanar CT images of the lumbar spine were reconstructed from contemporary CT of the Abdomen and Pelvis. RADIATION DOSE REDUCTION: This exam was performed according to the departmental dose-optimization program which includes automated exposure control, adjustment of the mA and/or kV according to patient size and/or use of iterative reconstruction technique. CONTRAST:  None additional COMPARISON:   08/13/2023 FINDINGS: Segmentation: 5 lumbar type vertebrae. Alignment: Normal. Vertebrae: No acute fracture or focal pathologic process. Paraspinal and other soft tissues: No perispinal hematoma or masslike finding. Advanced atherosclerosis with prominent narrowing of the left iliac artery. 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 At least moderate spinal stenosis at L4-5. IMPRESSION: No acute finding. Electronically Signed   By: Tiburcio Pea M.D.   On: 11/16/2023 04:14    Procedures Procedures    Medications Ordered in ED Medications  ketorolac (TORADOL) 15 MG/ML injection 15 mg (has no administration in time range)    ED Course/ Medical Decision Making/ A&P                                 Medical Decision Making Amount and/or Complexity of Data Reviewed Independent Historian: EMS Labs: ordered. Decision-making details documented in ED Course. Radiology: ordered and independent interpretation performed. Decision-making details documented  in ED Course. ECG/medicine tests: ordered and independent interpretation performed. Decision-making details documented in ED Course.  Risk Prescription drug management.   Patient with back pain after falling today.  He apparently fell out of bed striking his low back.  Did not hit his head.  Has previous right sided deficits from stroke which are unchanged.  Does take Eliquis. No new weakness, numbness or tingling.  History taking difficult due to his aphasia.  No obvious new deficits.  Traumatic imaging is reassuring.  No evidence of fractures throughout lumbar spine or pelvis.  No intra-abdominal injury.  Vital signs remained stable.  Patient able to stand and pivot which is his baseline.  He feels like his strength in the right leg is unchanged. Low suspicion for cord compression or cauda equina.  Pain is improved with Toradol.  Remains pain-free.  Appears to be at  his baseline.  He is able to stand and pivot with assistance.  Appears stable for outpatient follow-up with symptomatic treatment for likely back contusion.  No evidence of fracture.  Low concern for significant spinal cord injury.  Return to the ED for worsening pain, weakness, numbness, tingling, bowel or bladder incontinence or other concerns.         Final Clinical Impression(s) / ED Diagnoses Final diagnoses:  None    Rx / DC Orders ED Discharge Orders     None         Desmon Hitchner, Jeannett Senior, MD 11/16/23 [phone]

## 2023-11-16 NOTE — ED Notes (Signed)
[facility] called for transport back to address on file

## 2023-11-16 NOTE — Discharge Instructions (Signed)
Testing is negative for any acute injury or fractures.  Follow-up with your doctor.  Return to the ED with new or worsening symptoms.

## 2023-11-19 ENCOUNTER — Emergency Department (HOSPITAL_COMMUNITY)
Admission: EM | Admit: 2023-11-19 | Discharge: 2023-11-19 | Disposition: A | Discharge status: Home / Self Care | Payer: Medicaid Other | Attending: Emergency Medicine | Admitting: Emergency Medicine

## 2023-11-19 ENCOUNTER — Other Ambulatory Visit: Payer: Self-pay

## 2023-11-19 ENCOUNTER — Emergency Department (HOSPITAL_COMMUNITY): Payer: Medicaid Other

## 2023-11-19 ENCOUNTER — Encounter (HOSPITAL_COMMUNITY): Payer: Self-pay

## 2023-11-19 DIAGNOSIS — I110 Hypertensive heart disease with heart failure: Secondary | ICD-10-CM | POA: Insufficient documentation

## 2023-11-19 DIAGNOSIS — Z85118 Personal history of other malignant neoplasm of bronchus and lung: Secondary | ICD-10-CM | POA: Insufficient documentation

## 2023-11-19 DIAGNOSIS — I5022 Chronic systolic (congestive) heart failure: Secondary | ICD-10-CM | POA: Diagnosis not present

## 2023-11-19 DIAGNOSIS — J449 Chronic obstructive pulmonary disease, unspecified: Secondary | ICD-10-CM | POA: Diagnosis not present

## 2023-11-19 DIAGNOSIS — Y92002 Bathroom of unspecified non-institutional (private) residence single-family (private) house as the place of occurrence of the external cause: Secondary | ICD-10-CM | POA: Diagnosis not present

## 2023-11-19 DIAGNOSIS — M546 Pain in thoracic spine: Secondary | ICD-10-CM | POA: Diagnosis not present

## 2023-11-19 DIAGNOSIS — M25561 Pain in right knee: Secondary | ICD-10-CM | POA: Diagnosis not present

## 2023-11-19 DIAGNOSIS — Z87891 Personal history of nicotine dependence: Secondary | ICD-10-CM | POA: Diagnosis not present

## 2023-11-19 DIAGNOSIS — M25562 Pain in left knee: Secondary | ICD-10-CM | POA: Insufficient documentation

## 2023-11-19 DIAGNOSIS — W1839XA Other fall on same level, initial encounter: Secondary | ICD-10-CM | POA: Diagnosis not present

## 2023-11-19 DIAGNOSIS — Z7901 Long term (current) use of anticoagulants: Secondary | ICD-10-CM | POA: Insufficient documentation

## 2023-11-19 DIAGNOSIS — M25521 Pain in right elbow: Secondary | ICD-10-CM | POA: Insufficient documentation

## 2023-11-19 DIAGNOSIS — R519 Headache, unspecified: Secondary | ICD-10-CM | POA: Insufficient documentation

## 2023-11-19 DIAGNOSIS — M25511 Pain in right shoulder: Secondary | ICD-10-CM | POA: Diagnosis not present

## 2023-11-19 DIAGNOSIS — W19XXXA Unspecified fall, initial encounter: Secondary | ICD-10-CM

## 2023-11-19 MED ORDER — ACETAMINOPHEN 500 MG PO TABS
1000.0000 mg | ORAL_TABLET | Freq: Once | ORAL | Status: AC
  Administered 2023-11-19: 1000 mg via ORAL
  Filled 2023-11-19: qty 2

## 2023-11-19 MED ORDER — CYCLOBENZAPRINE HCL 10 MG PO TABS: 10.0000 mg | ORAL_TABLET | Freq: Two times a day (BID) | ORAL | 0 refills | Status: DC | PRN

## 2023-11-19 NOTE — Discharge Instructions (Addendum)
It was a pleasure caring for you today in the emergency department.  Please call your PCP to arrange follow-up next week  Please return to the emergency department for any worsening or worrisome symptoms.

## 2023-11-19 NOTE — ED Provider Notes (Signed)
[place] EMERGENCY DEPARTMENT AT [facility] Provider Note  CSN: [id number] Arrival date & time: 11/19/23 2725  Chief Complaint(s) Fall  HPI Todd Estrada is a 62 y.o. male with past medical history as below, significant for COPD, HLD, HTN, PAD, seizures who presents to the ED with complaint of fall.   Patient is a poor historian, difficult communication secondary to prior CVA, can nod yes and no to questions.  Patient with a fall this morning in the bathroom, no LOC, believes he hit his head.  Denies having a seizure.  He has pain to his right elbow right shoulder head entire back and both knees.  He uses a walker at baseline but the walker had fallen onto the floor and he had difficulty getting up.  Slipped on the floor. Prior to this he was feeling okay, taking his medications as prescribed, no vomiting or nausea, using the bathroom normally, no chest pain or palpitations prior to the fall.  He is anticoagulated on Eliquis  Past Medical History Past Medical History:  Diagnosis Date   Asthma    Atypical chest pain 08/13/2022   Community acquired pneumonia 09/14/2022   COPD (chronic obstructive pulmonary disease) (HCC)    Essential hypertension 08/19/2021   GERD (gastroesophageal reflux disease)    HAP (hospital-acquired pneumonia) 09/16/2022   History of tracheostomy    03/09/22-04/11/22   HLD (hyperlipidemia)    Hypertension    Hypokalemia 08/13/2022   Hypomagnesemia 08/13/2022   Lung cancer (HCC)    PAD (peripheral artery disease) (HCC)    Seizures (HCC) 06/02/2022   Sepsis (HCC) 08/13/2022   Sepsis due to pneumonia (HCC) 04/13/2023   Stroke (HCC) 02/2022   Patient Active Problem List   Diagnosis Date Noted   Cancer related pain 09/20/2023   Intra-abdominal abscess (HCC) 07/25/2023   Intra-abdominal fluid collection 07/20/2023   AKI (acute kidney injury) (HCC) 07/20/2023   Sepsis (HCC) 07/20/2023   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 07/07/2023   Acute  gangrenous cholecystitis 07/04/2023   Right upper lobe pneumonia 04/13/2023   Buttock wound, right, subsequent encounter 03/01/2023   PAD (peripheral artery disease) (HCC) 01/06/2023   Chronic hypotension 01/06/2023   History of pulmonary embolism 10/12/2022   Cervical transverse process fracture (HCC) 09/10/2022   Frequent falls 08/27/2022   Aphasia as late effect of cerebrovascular accident 08/16/2022   Septic shock (HCC) 08/13/2022   Anemia of chronic disease 08/13/2022   Hypomagnesemia 08/13/2022   Chronic heart failure with mildly reduced ejection fraction (HFmrEF) (HCC) 08/13/2022   Necrotizing pneumonia (HCC) 08/12/2022   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 Authorizing Provider  albuterol (PROVENTIL) (2.5 MG/3ML) 0.083% nebulizer solution Take 3 mLs (2.5 mg total) by nebulization every 4 (four) hours as needed for wheezing or shortness of breath. 08/16/22   Steffanie Rainwater, MD  apixaban (ELIQUIS) 5 MG TABS tablet TAKE 1 TABLET(5 MG) BY MOUTH TWICE DAILY Patient taking differently: Take 5 mg by mouth  2 (two) times daily. 04/11/23   Briscoe Burns, MD  atorvastatin (LIPITOR) 40 MG tablet Take 1 tablet (40 mg total) by mouth daily. 01/03/23 01/03/24  Briscoe Burns, MD  budesonide (PULMICORT) 0.5 MG/2ML nebulizer solution NEW PRESCRIPTION REQEUST: BUDESONIDE 0.5 MG/ - USE ONE VIAL TWICE DAILY Patient not taking: Reported on  08/19/2023 11/29/22   Martina Sinner, MD  cephALEXin (KEFLEX) 500 MG capsule Take 1 capsule (500 mg total) by mouth 4 (four) times daily. 10/17/23   Durwin Glaze, MD  levETIRAcetam (KEPPRA) 500 MG tablet Take 1 tablet (500 mg total) by mouth 2 (two) times daily. 10/30/23   Ihor Austin, NP  midodrine (PROAMATINE) 2.5 MG tablet Take 2.5 mg by mouth 2 (two) times daily with a meal.    [provider]  Multiple Vitamin (MULTIVITAMIN WITH MINERALS) TABS tablet Take 1 tablet by mouth daily.    [provider]  oxyCODONE-acetaminophen (PERCOCET) 5-325 MG tablet Take 1 tablet by mouth every 6 (six) hours as needed. 09/20/23   Atway, Rayann N, DO  oxyCODONE-acetaminophen (PERCOCET/ROXICET) 5-325 MG tablet Take 1 tablet by mouth every 6 (six) hours as needed for severe pain (pain score 7-10). 10/17/23   Durwin Glaze, MD  pantoprazole (PROTONIX) 40 MG tablet Take 1 tablet (40 mg total) by mouth daily. 09/20/23 10/20/23  Atway, Rayann N, DO  sucralfate (CARAFATE) 1 GM/10ML suspension Take 10 mLs (1 g total) by mouth 4 (four) times daily -  with meals and at bedtime. 07/02/23   Nira Conn, MD  Tiotropium Bromide-Olodaterol (STIOLTO RESPIMAT) 2.5-2.5 MCG/ACT AERS NEW PRESCRIPTION REQUEST: STIOLTO 2.5 MCG- INHALE TWO PUFFS BY MOUTH DAILY Patient taking differently: Inhale 2 puffs into the lungs daily. 11/29/22   Martina Sinner, MD                                                                                                                                    Past Surgical History Past Surgical History:  Procedure Laterality Date   BRONCHIAL BIOPSY  05/30/2022   Procedure: BRONCHIAL BIOPSIES;  Surgeon: Leslye Peer, MD;  Location: [facility] ENDOSCOPY;  Service: Pulmonary;;   BRONCHIAL BRUSHINGS  05/30/2022   Procedure: BRONCHIAL BRUSHINGS;  Surgeon: Leslye Peer, MD;  Location: [facility] ENDOSCOPY;  Service: Pulmonary;;   BRONCHIAL NEEDLE ASPIRATION BIOPSY  05/30/2022   Procedure:  BRONCHIAL NEEDLE ASPIRATION BIOPSIES;  Surgeon: Leslye Peer, MD;  Location: [facility] ENDOSCOPY;  Service: Pulmonary;;   BRONCHIAL WASHINGS  05/30/2022   Procedure: BRONCHIAL WASHINGS;  Surgeon: Leslye Peer, MD;  Location: [facility] ENDOSCOPY;  Service: Pulmonary;;   CHOLECYSTECTOMY N/A 07/05/2023   Procedure: LAPAROSCOPIC CHOLECYSTECTOMY WITH INTRAOPERATIVE CHOLANGIOGRAM;  Surgeon: Karie Soda, MD;  Location: [facility] ORS;  Service: General;  Laterality: N/A;   ERCP N/A 07/06/2023   Procedure: ENDOSCOPIC RETROGRADE CHOLANGIOPANCREATOGRAPHY (ERCP);  Surgeon: Vida Rigger, MD;  Location: [facility] ENDOSCOPY;  Service: Gastroenterology;  Laterality:  N/A;   ESOPHAGOGASTRODUODENOSCOPY (EGD) WITH PROPOFOL N/A 03/11/2022   Procedure: ESOPHAGOGASTRODUODENOSCOPY (EGD) WITH PROPOFOL;  Surgeon: Violeta Gelinas, MD;  Location: [facility] ENDOSCOPY;  Service: General;  Laterality: N/A;   FIDUCIAL MARKER PLACEMENT  05/30/2022   Procedure: FIDUCIAL MARKER PLACEMENT;  Surgeon: Leslye Peer, MD;  Location: [facility] ENDOSCOPY;  Service: Pulmonary;;   IR ANGIO INTRA EXTRACRAN SEL COM CAROTID INNOMINATE UNI R MOD SED  03/02/2022   IR CT HEAD LTD  03/02/2022   IR PERCUTANEOUS ART THROMBECTOMY/INFUSION INTRACRANIAL INC DIAG ANGIO  03/02/2022   PEG PLACEMENT N/A 03/11/2022   Procedure: PERCUTANEOUS ENDOSCOPIC GASTROSTOMY (PEG) PLACEMENT;  Surgeon: Violeta Gelinas, MD;  Location: [facility] ENDOSCOPY;  Service: General;  Laterality: N/A;   RADIOLOGY WITH ANESTHESIA N/A 03/02/2022   Procedure: IR WITH ANESTHESIA;  Surgeon: Julieanne Cotton, MD;  Location: [facility] OR;  Service: Radiology;  Laterality: N/A;   REMOVAL OF STONES  07/06/2023   Procedure: REMOVAL OF STONES;  Surgeon: Vida Rigger, MD;  Location: [facility] ENDOSCOPY;  Service: Gastroenterology;;   Dennison Mascot  07/06/2023   Procedure: Dennison Mascot;  Surgeon: Vida Rigger, MD;  Location: [facility] ENDOSCOPY;  Service: Gastroenterology;;   VIDEO BRONCHOSCOPY WITH RADIAL ENDOBRONCHIAL ULTRASOUND  05/30/2022   Procedure:  VIDEO BRONCHOSCOPY WITH RADIAL ENDOBRONCHIAL ULTRASOUND;  Surgeon: Leslye Peer, MD;  Location: [facility] ENDOSCOPY;  Service: Pulmonary;;   Family History Family History  Problem Relation Age of Onset   Throat cancer Mother    Liver cancer Father    Kidney failure Sister    Cancer - Lung Paternal Uncle     Social History Social History   Tobacco Use   Smoking status: Former    Current packs/day: 0.00    Types: Cigarettes    Quit date: 05/02/2022    Years since quitting: 1.5    Passive exposure: Never   Smokeless tobacco: Never  Vaping Use   Vaping status: Never Used  Substance Use Topics   Alcohol use: Not Currently   Drug use: No   Allergies Patient has no known allergies.  Review of Systems Review of Systems  Constitutional:  Negative for chills and fever.  Respiratory:  Negative for chest tightness and shortness of breath.   Cardiovascular:  Negative for chest pain and palpitations.  Gastrointestinal:  Negative for abdominal pain, nausea and vomiting.  Genitourinary:  Negative for dysuria.  Musculoskeletal:  Positive for arthralgias and back pain.  Neurological:  Positive for headaches.  All other systems reviewed and are negative.   Physical Exam Vital Signs  I have reviewed the triage vital signs BP (!) 134/97 (BP Location: Left Arm)   Pulse 83   Temp 97.8 F (36.6 C) (Oral)   Resp 16   SpO2 100%  Physical Exam Vitals and nursing note reviewed.  Constitutional:      General: He is not in acute distress.    Appearance: Normal appearance. He is well-developed.  HENT:     Head: Normocephalic and atraumatic. No raccoon eyes, Battle's sign, right periorbital erythema or left periorbital erythema.     Comments: No external evidence of head trauma    Right Ear: External ear normal.     Left Ear: External ear normal.     Mouth/Throat:     Mouth: Mucous membranes are moist.  Eyes:     General: No scleral icterus. Cardiovascular:     Rate and Rhythm: Normal  rate and regular rhythm.     Pulses: Normal pulses.     Heart sounds: Normal heart  sounds.  Pulmonary:     Effort: Pulmonary effort is normal. No respiratory distress.     Breath sounds: Normal breath sounds.  Abdominal:     General: Abdomen is flat.     Palpations: Abdomen is soft.     Tenderness: There is no abdominal tenderness.  Musculoskeletal:       Arms:     Cervical back: No rigidity.       Back:     Right lower leg: No edema.     Left lower leg: No edema.       Legs:  Skin:    General: Skin is warm and dry.     Capillary Refill: Capillary refill takes less than 2 seconds.  Neurological:     Mental Status: He is alert.     Comments: Right-sided deficit from prior stroke  Psychiatric:        Mood and Affect: Mood normal.        Behavior: Behavior normal.     ED Results and Treatments Labs (all labs ordered are listed, but only abnormal results are displayed) Labs Reviewed - No data to display                                                                                                                        Radiology No results found.  Pertinent labs & imaging results that were available during my care of the patient were reviewed by me and considered in my medical decision making (see MDM for details).  Medications Ordered in ED Medications - No data to display                                                                                                                                   Procedures Procedures  (including critical care time)  Medical Decision Making / ED Course    Medical Decision Making:    Todd Estrada is a 62 y.o. male with past medical history as below, significant for COPD, HLD, HTN, PAD, seizures who presents to the ED with complaint of fall. . The complaint involves an extensive differential diagnosis and also carries with it a high risk of complications and morbidity.  Serious etiology was considered. Ddx includes but is not  limited to: Differential diagnoses for head trauma includes subdural hematoma, epidural hematoma, acute concussion, traumatic subarachnoid hemorrhage, cerebral contusions, fracture, dislocation, MSK injury etc.  Complete initial physical exam performed, notably the patient was in no acute distress, sitting upright on stretcher.    Reviewed and confirmed nursing documentation for past medical history, family history, social history.  Vital signs reviewed.    Patient with apparent mechanical fall this morning in the bathroom, multiple sites of injury reported, exam is reassuring.  Will get imaging  Clinical Course as of 11/19/23 1003  Sun Nov 19, 2023  0935 Imaging stable [SG]    Clinical Course User Index [SG] Sloan Leiter, DO    Brief summary: 62 year old male history of prior CVA with apparent mechanical fall this morning.  Imaging stable, his exam is stable, he is feeling better.  Recommend discharge at this time.  He has walker at home.  Feels he is at his baseline given his residual stroke deficits.  Feels his deficits are also at baseline.  The patient improved significantly and was discharged in stable condition. Detailed discussions were had with the patient regarding current findings, and need for close f/u with PCP or on call doctor. The patient has been instructed to return immediately if the symptoms worsen in any way for re-evaluation. Patient verbalized understanding and is in agreement with current care plan. All questions answered prior to discharge.                  Additional history obtained: -Additional history obtained from na -External records from outside source obtained and reviewed including: Chart review including previous notes, labs, imaging, consultation notes including  Home medications, prior ED visits, prior labs and imaging   Lab Tests: na  EKG   EKG Interpretation Date/Time:    Ventricular Rate:    PR Interval:    QRS Duration:     QT Interval:    QTC Calculation:   R Axis:      Text Interpretation:           Imaging Studies ordered: I ordered imaging studies including CT head and spine, x-ray of extremities I independently visualized the following imaging with scope of interpretation limited to determining acute life threatening conditions related to emergency care; findings noted above I independently visualized and interpreted imaging. I agree with the radiologist interpretation   Medicines ordered and prescription drug management: No orders of the defined types were placed in this encounter.   -I have reviewed the patients home medicines and have made adjustments as needed   Consultations Obtained: na   Cardiac Monitoring: Continuous pulse oximetry interpreted by myself, 99% on RA.    Social Determinants of Health:  Diagnosis or treatment significantly limited by social determinants of health: former smoker   Reevaluation: After the interventions noted above, I reevaluated the patient and found that they have improved  Co morbidities that complicate the patient evaluation  Past Medical History:  Diagnosis Date   Asthma    Atypical chest pain 08/13/2022   Community acquired pneumonia 09/14/2022   COPD (chronic obstructive pulmonary disease) (HCC)    Essential hypertension 08/19/2021   GERD (gastroesophageal reflux disease)    HAP (hospital-acquired pneumonia) 09/16/2022   History of tracheostomy    03/09/22-04/11/22   HLD (hyperlipidemia)    Hypertension    Hypokalemia 08/13/2022   Hypomagnesemia 08/13/2022   Lung cancer (HCC)    PAD (peripheral artery disease) (HCC)    Seizures (HCC) 06/02/2022   Sepsis (HCC) 08/13/2022   Sepsis due to pneumonia (HCC) 04/13/2023   Stroke (HCC) 02/2022      Dispostion: Disposition decision  including need for hospitalization was considered, and patient discharged from emergency department.    Final Clinical Impression(s) / ED  Diagnoses Final diagnoses:  None        Sloan Leiter, DO 11/19/23 1005

## 2023-11-19 NOTE — ED Notes (Signed)
Writer called pt's cousin for ride home, however a HIPAA compliant voicemail was left.

## 2023-11-19 NOTE — ED Triage Notes (Signed)
Pt to ED from home following a mechanical fall. Pt was getting up to got the the bathroom when he tripped and landed on his right shoulder. Pt c/o R shoulder, arm and back pain. Pt is taking elaquis. Arrives at baseline (Previous cva), VSS, NADN.

## 2023-11-22 ENCOUNTER — Other Ambulatory Visit: Payer: Medicaid Other

## 2023-11-22 ENCOUNTER — Ambulatory Visit: Payer: Medicaid Other | Admitting: Internal Medicine

## 2023-12-01 ENCOUNTER — Emergency Department (HOSPITAL_COMMUNITY)
Admission: EM | Admit: 2023-12-01 | Discharge: 2023-12-01 | Disposition: A | Discharge status: Home / Self Care | Payer: Medicaid Other | Attending: Emergency Medicine | Admitting: Emergency Medicine

## 2023-12-01 ENCOUNTER — Encounter (HOSPITAL_COMMUNITY): Payer: Self-pay | Admitting: Emergency Medicine

## 2023-12-01 DIAGNOSIS — Z8673 Personal history of transient ischemic attack (TIA), and cerebral infarction without residual deficits: Secondary | ICD-10-CM | POA: Insufficient documentation

## 2023-12-01 DIAGNOSIS — Z7901 Long term (current) use of anticoagulants: Secondary | ICD-10-CM | POA: Diagnosis not present

## 2023-12-01 DIAGNOSIS — R300 Dysuria: Secondary | ICD-10-CM | POA: Insufficient documentation

## 2023-12-01 LAB — COMPREHENSIVE METABOLIC PANEL
ALT: 15 U/L (ref 0–44)
AST: 21 U/L (ref 15–41)
Albumin: 4.4 g/dL (ref 3.5–5.0)
Alkaline Phosphatase: 88 U/L (ref 38–126)
Anion gap: 8 (ref 5–15)
BUN: 18 mg/dL (ref 8–23)
CO2: 28 mmol/L (ref 22–32)
Calcium: 9.6 mg/dL (ref 8.9–10.3)
Chloride: 102 mmol/L (ref 98–111)
Creatinine, Ser: 0.97 mg/dL (ref 0.61–1.24)
GFR, Estimated: >60 mL/min (ref >60)
Glucose, Bld: 79 mg/dL (ref 70–99)
Potassium: 3.4 mmol/L — ABNORMAL LOW (ref 3.5–5.1)
Sodium: 138 mmol/L (ref 135–145)
Total Bilirubin: 0.6 mg/dL (ref <1.2)
Total Protein: 8.1 g/dL (ref 6.5–8.1)

## 2023-12-01 LAB — CBC WITH DIFFERENTIAL/PLATELET
Abs Immature Granulocytes: 0.02 10*3/uL (ref 0.00–0.07)
Basophils Absolute: 0 10*3/uL (ref 0.0–0.1)
Basophils Relative: 0 %
Eosinophils Absolute: 0.1 10*3/uL (ref 0.0–0.5)
Eosinophils Relative: 2 %
HCT: 42.7 % (ref 39.0–52.0)
Hemoglobin: 13.9 g/dL (ref 13.0–17.0)
Immature Granulocytes: 0 %
Lymphocytes Relative: 15 %
Lymphs Abs: 0.9 10*3/uL (ref 0.7–4.0)
MCH: 29.7 pg (ref 26.0–34.0)
MCHC: 32.6 g/dL (ref 30.0–36.0)
MCV: 91.2 fL (ref 80.0–100.0)
Monocytes Absolute: 0.6 10*3/uL (ref 0.1–1.0)
Monocytes Relative: 9 %
Neutro Abs: 4.7 10*3/uL (ref 1.7–7.7)
Neutrophils Relative %: 74 %
Platelets: 148 10*3/uL — ABNORMAL LOW (ref 150–400)
RBC: 4.68 MIL/uL (ref 4.22–5.81)
RDW: 13 % (ref 11.5–15.5)
WBC: 6.4 10*3/uL (ref 4.0–10.5)
nRBC: 0 % (ref 0.0–0.2)

## 2023-12-01 LAB — URINALYSIS, ROUTINE W REFLEX MICROSCOPIC
Bilirubin Urine: NEGATIVE
Glucose, UA: NEGATIVE mg/dL
Hgb urine dipstick: NEGATIVE
Ketones, ur: NEGATIVE mg/dL
Leukocytes,Ua: NEGATIVE
Nitrite: NEGATIVE
Protein, ur: NEGATIVE mg/dL
Specific Gravity, Urine: 1.019 (ref 1.005–1.030)
pH: 5 (ref 5.0–8.0)

## 2023-12-01 LAB — TROPONIN I (HIGH SENSITIVITY): Troponin I (High Sensitivity): 5 ng/L (ref <18)

## 2023-12-01 NOTE — Discharge Instructions (Signed)
Your lab tests, including the test on your urine, is negative for infection. No cause is identified for the burning you have with urination. Please follow up with your doctor for recheck next week if symptoms persist.

## 2023-12-01 NOTE — ED Provider Notes (Signed)
[place] EMERGENCY DEPARTMENT AT [facility] Provider Note   CSN: [id number] Arrival date & time: 12/01/23  0055     History  Chief Complaint  Patient presents with   Dysuria    Todd Estrada is a 62 y.o. male.  Patient presents to the ED with complaint of dysuria. Chronic aphasia after CVA. Denies fever, vomiting.   The history is provided by the patient. No language interpreter was used.  Dysuria Presenting symptoms: dysuria        Home Medications Prior to Admission medications   Medication Sig Start Date End Date Taking? Authorizing Provider  albuterol (PROVENTIL) (2.5 MG/3ML) 0.083% nebulizer solution Take 3 mLs (2.5 mg total) by nebulization every 4 (four) hours as needed for wheezing or shortness of breath. 08/16/22   Steffanie Rainwater, MD  apixaban (ELIQUIS) 5 MG TABS tablet TAKE 1 TABLET(5 MG) BY MOUTH TWICE DAILY Patient taking differently: Take 5 mg by mouth 2 (two) times daily. 04/11/23   Briscoe Burns, MD  atorvastatin (LIPITOR) 40 MG tablet Take 1 tablet (40 mg total) by mouth daily. 01/03/23 01/03/24  Briscoe Burns, MD  budesonide (PULMICORT) 0.5 MG/2ML nebulizer solution NEW PRESCRIPTION REQEUST: BUDESONIDE 0.5 MG/ - USE ONE VIAL TWICE DAILY Patient not taking: Reported on 08/19/2023 11/29/22   Martina Sinner, MD  cephALEXin (KEFLEX) 500 MG capsule Take 1 capsule (500 mg total) by mouth 4 (four) times daily. 10/17/23   Durwin Glaze, MD  cyclobenzaprine (FLEXERIL) 10 MG tablet Take 1 tablet (10 mg total) by mouth 2 (two) times daily as needed for muscle spasms. 11/19/23   Sloan Leiter, DO  levETIRAcetam (KEPPRA) 500 MG tablet Take 1 tablet (500 mg total) by mouth 2 (two) times daily. 10/30/23   Ihor Austin, NP  midodrine (PROAMATINE) 2.5 MG tablet Take 2.5 mg by mouth 2 (two) times daily with a meal.    [provider]  Multiple Vitamin (MULTIVITAMIN WITH MINERALS) TABS tablet Take 1 tablet by mouth daily.    [provider]  oxyCODONE-acetaminophen (PERCOCET) 5-325 MG tablet Take 1 tablet by mouth every 6 (six) hours as needed. 09/20/23   Atway, Rayann N, DO  oxyCODONE-acetaminophen (PERCOCET/ROXICET) 5-325 MG tablet Take 1 tablet by mouth every 6 (six) hours as needed for severe pain (pain score 7-10). 10/17/23   Durwin Glaze, MD  pantoprazole (PROTONIX) 40 MG tablet TAKE 1 TABLET(40 MG) BY MOUTH DAILY 11/28/23   [name], MD  sucralfate (CARAFATE) 1 GM/10ML suspension Take 10 mLs (1 g total) by mouth 4 (four) times daily -  with meals and at bedtime. 07/02/23   Nira Conn, MD  Tiotropium Bromide-Olodaterol (STIOLTO RESPIMAT) 2.5-2.5 MCG/ACT AERS NEW PRESCRIPTION REQUEST: STIOLTO 2.5 MCG- INHALE TWO PUFFS BY MOUTH DAILY Patient taking differently: Inhale 2 puffs into the lungs daily. 11/29/22   Martina Sinner, MD      Allergies    Patient has no known allergies.    Review of Systems   Review of Systems  Genitourinary:  Positive for dysuria.    Physical Exam Updated Vital Signs BP 109/74   Pulse 84   Temp 98.2 F (36.8 C) (Oral)   Resp 16   SpO2 99%  Physical Exam Vitals and nursing note reviewed.  Constitutional:      Appearance: Normal appearance. He is well-developed.  Pulmonary:     Effort: Pulmonary effort is normal.  Abdominal:     General: There is no distension.  Palpations: Abdomen is soft.     Tenderness: There is no abdominal tenderness.  Musculoskeletal:        General: Normal range of motion.     Cervical back: Normal range of motion.  Skin:    General: Skin is warm and dry.  Neurological:     Mental Status: He is alert and oriented to person, place, and time.     ED Results / Procedures / Treatments   Labs (all labs ordered are listed, but only abnormal results are displayed) Labs Reviewed  CBC WITH DIFFERENTIAL/PLATELET - Abnormal; Notable for the following components:      Result Value   Platelets 148 (*)    All other components  within normal limits  COMPREHENSIVE METABOLIC PANEL - Abnormal; Notable for the following components:   Potassium 3.4 (*)    All other components within normal limits  URINALYSIS, ROUTINE W REFLEX MICROSCOPIC  TROPONIN I (HIGH SENSITIVITY)   Results for orders placed or performed during the hospital encounter of 12/01/23  CBC with Differential   Collection Time: 12/01/23  1:56 AM  Result Value Ref Range   WBC 6.4 4.0 - 10.5 K/uL   RBC 4.68 4.22 - 5.81 MIL/uL   Hemoglobin 13.9 13.0 - 17.0 g/dL   HCT 78.2 95.6 - 21.3 %   MCV 91.2 80.0 - 100.0 fL   MCH 29.7 26.0 - 34.0 pg   MCHC 32.6 30.0 - 36.0 g/dL   RDW 08.6 57.8 - 46.9 %   Platelets 148 (L) 150 - 400 K/uL   nRBC 0.0 0.0 - 0.2 %   Neutrophils Relative % 74 %   Neutro Abs 4.7 1.7 - 7.7 K/uL   Lymphocytes Relative 15 %   Lymphs Abs 0.9 0.7 - 4.0 K/uL   Monocytes Relative 9 %   Monocytes Absolute 0.6 0.1 - 1.0 K/uL   Eosinophils Relative 2 %   Eosinophils Absolute 0.1 0.0 - 0.5 K/uL   Basophils Relative 0 %   Basophils Absolute 0.0 0.0 - 0.1 K/uL   Immature Granulocytes 0 %   Abs Immature Granulocytes 0.02 0.00 - 0.07 K/uL  Comprehensive metabolic panel   Collection Time: 12/01/23  1:56 AM  Result Value Ref Range   Sodium 138 135 - 145 mmol/L   Potassium 3.4 (L) 3.5 - 5.1 mmol/L   Chloride 102 98 - 111 mmol/L   CO2 28 22 - 32 mmol/L   Glucose, Bld 79 70 - 99 mg/dL   BUN 18 8 - 23 mg/dL   Creatinine, Ser 6.29 0.61 - 1.24 mg/dL   Calcium 9.6 8.9 - 52.8 mg/dL   Total Protein 8.1 6.5 - 8.1 g/dL   Albumin 4.4 3.5 - 5.0 g/dL   AST 21 15 - 41 U/L   ALT 15 0 - 44 U/L   Alkaline Phosphatase 88 38 - 126 U/L   Total Bilirubin 0.6 <1.2 mg/dL   GFR, Estimated >41 >32 mL/min   Anion gap 8 5 - 15  Troponin I (High Sensitivity)   Collection Time: 12/01/23  1:56 AM  Result Value Ref Range   Troponin I (High Sensitivity) 5 <18 ng/L  Urinalysis, Routine w reflex microscopic -Urine, Clean Catch   Collection Time: 12/01/23  4:35 AM   Result Value Ref Range   Color, Urine YELLOW YELLOW   APPearance CLEAR CLEAR   Specific Gravity, Urine 1.019 1.005 - 1.030   pH 5.0 5.0 - 8.0   Glucose, UA NEGATIVE NEGATIVE mg/dL  Hgb urine dipstick NEGATIVE NEGATIVE   Bilirubin Urine NEGATIVE NEGATIVE   Ketones, ur NEGATIVE NEGATIVE mg/dL   Protein, ur NEGATIVE NEGATIVE mg/dL   Nitrite NEGATIVE NEGATIVE   Leukocytes,Ua NEGATIVE NEGATIVE     EKG None  Radiology No results found.  Procedures Procedures    Medications Ordered in ED Medications - No data to display  ED Course/ Medical Decision Making/ A&P Clinical Course as of 12/01/23 0522  Fri Dec 01, 2023  1610 Patient to ED with c/o dysuria. No fever. Well appearing, in NAD. Electrolytes and renal function normal. No leukocytosis. UA negative. VSS. Afebrile. He is felt appropriate for discharge home, follow up with PCP.  [SU]    Clinical Course User Index [SU] Elpidio Anis, PA-C                                 Medical Decision Making Amount and/or Complexity of Data Reviewed Labs: ordered.           Final Clinical Impression(s) / ED Diagnoses Final diagnoses:  Dysuria    Rx / DC Orders ED Discharge Orders     None         Danne Harbor 12/01/23 Pollie Meyer, MD 12/08/23 2255

## 2023-12-01 NOTE — ED Triage Notes (Signed)
PT reports burning with urination. 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 Communication is limited.

## 2023-12-09 ENCOUNTER — Emergency Department (HOSPITAL_COMMUNITY)
Admission: EM | Admit: 2023-12-09 | Discharge: 2023-12-10 | Disposition: A | Discharge status: Home / Self Care | Payer: Medicaid Other | Attending: Emergency Medicine | Admitting: Emergency Medicine

## 2023-12-09 ENCOUNTER — Encounter (HOSPITAL_COMMUNITY): Payer: Self-pay

## 2023-12-09 DIAGNOSIS — Z7901 Long term (current) use of anticoagulants: Secondary | ICD-10-CM | POA: Diagnosis not present

## 2023-12-09 DIAGNOSIS — Z8673 Personal history of transient ischemic attack (TIA), and cerebral infarction without residual deficits: Secondary | ICD-10-CM | POA: Insufficient documentation

## 2023-12-09 DIAGNOSIS — K6289 Other specified diseases of anus and rectum: Secondary | ICD-10-CM | POA: Diagnosis present

## 2023-12-09 NOTE — ED Triage Notes (Signed)
Pt has chronic aphasia from prior strokes but reports that he is having rectal pain from hemorrhoids.

## 2023-12-10 ENCOUNTER — Emergency Department (HOSPITAL_COMMUNITY): Payer: Medicaid Other

## 2023-12-10 LAB — I-STAT CHEM 8, ED
BUN: 16 mg/dL (ref 8–23)
Calcium, Ion: 1.23 mmol/L (ref 1.15–1.40)
Chloride: 103 mmol/L (ref 98–111)
Creatinine, Ser: 0.8 mg/dL (ref 0.61–1.24)
Glucose, Bld: 99 mg/dL (ref 70–99)
HCT: 39 % (ref 39.0–52.0)
Hemoglobin: 13.3 g/dL (ref 13.0–17.0)
Potassium: 3.9 mmol/L (ref 3.5–5.1)
Sodium: 144 mmol/L (ref 135–145)
TCO2: 29 mmol/L (ref 22–32)

## 2023-12-10 MED ORDER — IOHEXOL 300 MG/ML  SOLN
100.0000 mL | Freq: Once | INTRAMUSCULAR | Status: AC | PRN
  Administered 2023-12-10: 100 mL via INTRAVENOUS

## 2023-12-10 MED ORDER — HYDROCORTISONE ACETATE 25 MG RE SUPP
25.0000 mg | Freq: Two times a day (BID) | RECTAL | 0 refills | Status: DC
Start: 2023-12-10 — End: 2024-04-26

## 2023-12-10 NOTE — Discharge Instructions (Addendum)
We are prescribing a steroid suppository to help with your rectal pain/hemorrhoids.  There are other over-the-counter medicines you can try as well.  Follow-up with your primary care physician and gastroenterology.  If you develop new or worsening rectal pain, bleeding, fever, or any other new/concerning symptoms then return to the ER or call 911.

## 2023-12-10 NOTE — ED Provider Notes (Signed)
[place] EMERGENCY DEPARTMENT AT [facility] Provider Note   CSN: [id number] Arrival date & time: 12/09/23  2123     History  Chief Complaint  Patient presents with   Hemorrhoids    Todd Estrada is a 62 y.o. male.  HPI 62 year old male presents with rectal pain.  History is pretty limited as the patient has had a prior stroke and has resulting chronic aphasia.  He seems to be able to point to his anus to say that he is having pain.  When asked he seems to say no for rectal bleeding or fever or abdominal pain.  Seems to deny dysuria, penile pain, scrotal pain.  Home Medications Prior to Admission medications   Medication Sig Start Date End Date Taking? Authorizing Provider  hydrocortisone (ANUSOL-HC) 25 MG suppository Place 1 suppository (25 mg total) rectally 2 (two) times daily. For 7 days 12/10/23  Yes Pricilla Loveless, MD  albuterol (PROVENTIL) (2.5 MG/3ML) 0.083% nebulizer solution Take 3 mLs (2.5 mg total) by nebulization every 4 (four) hours as needed for wheezing or shortness of breath. 08/16/22   Steffanie Rainwater, MD  apixaban (ELIQUIS) 5 MG TABS tablet TAKE 1 TABLET(5 MG) BY MOUTH TWICE DAILY Patient taking differently: Take 5 mg by mouth 2 (two) times daily. 04/11/23   Briscoe Burns, MD  atorvastatin (LIPITOR) 40 MG tablet Take 1 tablet (40 mg total) by mouth daily. 01/03/23 01/03/24  Briscoe Burns, MD  budesonide (PULMICORT) 0.5 MG/2ML nebulizer solution NEW PRESCRIPTION REQEUST: BUDESONIDE 0.5 MG/ - USE ONE VIAL TWICE DAILY Patient not taking: Reported on 08/19/2023 11/29/22   Martina Sinner, MD  cephALEXin (KEFLEX) 500 MG capsule Take 1 capsule (500 mg total) by mouth 4 (four) times daily. 10/17/23   Durwin Glaze, MD  cyclobenzaprine (FLEXERIL) 10 MG tablet Take 1 tablet (10 mg total) by mouth 2 (two) times daily as needed for muscle spasms. 11/19/23   Sloan Leiter, DO  levETIRAcetam (KEPPRA) 500 MG tablet Take 1 tablet (500 mg total) by mouth 2  (two) times daily. 10/30/23   Ihor Austin, NP  midodrine (PROAMATINE) 2.5 MG tablet Take 2.5 mg by mouth 2 (two) times daily with a meal.    [provider]  Multiple Vitamin (MULTIVITAMIN WITH MINERALS) TABS tablet Take 1 tablet by mouth daily.    [provider]  oxyCODONE-acetaminophen (PERCOCET) 5-325 MG tablet Take 1 tablet by mouth every 6 (six) hours as needed. 09/20/23   Atway, Rayann N, DO  oxyCODONE-acetaminophen (PERCOCET/ROXICET) 5-325 MG tablet Take 1 tablet by mouth every 6 (six) hours as needed for severe pain (pain score 7-10). 10/17/23   Durwin Glaze, MD  pantoprazole (PROTONIX) 40 MG tablet TAKE 1 TABLET(40 MG) BY MOUTH DAILY 11/28/23   [name], MD  sucralfate (CARAFATE) 1 GM/10ML suspension Take 10 mLs (1 g total) by mouth 4 (four) times daily -  with meals and at bedtime. 07/02/23   Nira Conn, MD  Tiotropium Bromide-Olodaterol (STIOLTO RESPIMAT) 2.5-2.5 MCG/ACT AERS NEW PRESCRIPTION REQUEST: STIOLTO 2.5 MCG- INHALE TWO PUFFS BY MOUTH DAILY Patient taking differently: Inhale 2 puffs into the lungs daily. 11/29/22   Martina Sinner, MD      Allergies    Patient has no known allergies.    Review of Systems   Review of Systems  Unable to perform ROS: Patient nonverbal    Physical Exam Updated Vital Signs BP 121/86   Pulse 90   Temp 98.6 F (  37 C) (Oral)   Resp (!) 1   Ht 6' (1.829 m)   Wt 83.5 kg   SpO2 97%   BMI 24.95 kg/m  Physical Exam Vitals and nursing note reviewed. Exam conducted with a chaperone present.  Constitutional:      General: He is not in acute distress.    Appearance: He is well-developed.  HENT:     Head: Normocephalic and atraumatic.  Pulmonary:     Effort: Pulmonary effort is normal.  Abdominal:     General: There is no distension.     Palpations: Abdomen is soft.     Tenderness: There is no abdominal tenderness.  Genitourinary:    Comments: No obvious scrotal swelling, tenderness. No  perineal tenderness, swelling or color change. There seems to be small hemorrhoids, but no thrombosis. No external tenderness. On DRE, there is no gross blood but this induces pain for the patient. No obvious mass. Skin:    General: Skin is warm and dry.  Neurological:     Mental Status: He is alert.     ED Results / Procedures / Treatments   Labs (all labs ordered are listed, but only abnormal results are displayed) Labs Reviewed  I-STAT CHEM 8, ED    EKG None  Radiology CT PELVIS W CONTRAST Result Date: 12/10/2023 CLINICAL DATA:  Rectal pain.  Evaluate for abscess. EXAM: CT PELVIS WITH CONTRAST TECHNIQUE: Multidetector CT imaging of the pelvis was performed using the standard protocol following the bolus administration of intravenous contrast. RADIATION DOSE REDUCTION: This exam was performed according to the departmental dose-optimization program which includes automated exposure control, adjustment of the mA and/or kV according to patient size and/or use of iterative reconstruction technique. CONTRAST:  OMNIPAQUE IOHEXOL 300 MG/ML  SOLN COMPARISON:  None Available. FINDINGS: Urinary Tract:  No abnormality visualized. Bowel: Unremarkable visualized pelvic bowel loops. No CT evidence for perirectal abscess or perirectal fistula Vascular/Lymphatic: Moderate to advanced atherosclerosis noted distal aorta and right common iliac artery. Chronic occlusion noted left common iliac artery with reconstitution of the left internal and external iliac arteries. No pelvic sidewall lymphadenopathy. Reproductive: The prostate gland and seminal vesicles are unremarkable. Other:  No intraperitoneal free fluid. Musculoskeletal: No worrisome lytic or sclerotic osseous abnormality. IMPRESSION: 1. No CT evidence for perirectal abscess or perirectal fistula. 2. 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 3.  Aortic Atherosclerosis  (ICD10-I70.0). Electronically Signed   By: Kennith Center M.D.   On: 12/10/2023 08:39    Procedures Procedures    Medications Ordered in ED Medications  iohexol (OMNIPAQUE) 300 MG/ML solution 100 mL (100 mLs Intravenous Contrast Given 12/10/23 0815)    ED Course/ Medical Decision Making/ A&P                                 Medical Decision Making Amount and/or Complexity of Data Reviewed Labs:     Details: Unremarkable labs Radiology: ordered and independent interpretation performed.    Details: No obvious perirectal abscess  Risk Prescription drug management.   Patient seems to have some nonthrombosed hemorrhoids on exam.  However this is pretty mild and unclear if this is what was causing his discomfort.  Thus a CT was obtained given the limited history and exam based on his chronic deficits.  CT is reassuring.  Labs unremarkable and his vital signs are unremarkable.  Will give  Anusol suppository and recommend he follow-up with PCP and GI.  No evidence of gross bleeding on exam.  Will give return precautions.        Final Clinical Impression(s) / ED Diagnoses Final diagnoses:  Rectal pain    Rx / DC Orders ED Discharge Orders          Ordered    hydrocortisone (ANUSOL-HC) 25 MG suppository  2 times daily        12/10/23 1610              Pricilla Loveless, MD 12/10/23 1001

## 2024-01-01 ENCOUNTER — Other Ambulatory Visit: Payer: Self-pay | Admitting: Student

## 2024-01-12 ENCOUNTER — Emergency Department (HOSPITAL_COMMUNITY): Payer: Medicaid Other

## 2024-01-12 ENCOUNTER — Emergency Department (HOSPITAL_COMMUNITY)
Admission: EM | Admit: 2024-01-12 | Discharge: 2024-01-12 | Disposition: A | Discharge status: Home / Self Care | Payer: Medicaid Other

## 2024-01-12 ENCOUNTER — Encounter (HOSPITAL_COMMUNITY): Payer: Self-pay | Admitting: Emergency Medicine

## 2024-01-12 DIAGNOSIS — F801 Expressive language disorder: Secondary | ICD-10-CM | POA: Diagnosis not present

## 2024-01-12 DIAGNOSIS — R1032 Left lower quadrant pain: Secondary | ICD-10-CM | POA: Diagnosis present

## 2024-01-12 DIAGNOSIS — R109 Unspecified abdominal pain: Secondary | ICD-10-CM

## 2024-01-12 DIAGNOSIS — Z7901 Long term (current) use of anticoagulants: Secondary | ICD-10-CM | POA: Diagnosis not present

## 2024-01-12 LAB — URINALYSIS, ROUTINE W REFLEX MICROSCOPIC
Bacteria, UA: NONE SEEN
Bilirubin Urine: NEGATIVE
Glucose, UA: NEGATIVE mg/dL
Hgb urine dipstick: NEGATIVE
Ketones, ur: NEGATIVE mg/dL
Leukocytes,Ua: NEGATIVE
Nitrite: NEGATIVE
Protein, ur: 30 mg/dL — AB
Specific Gravity, Urine: 1.028 (ref 1.005–1.030)
pH: 5 (ref 5.0–8.0)

## 2024-01-12 LAB — COMPREHENSIVE METABOLIC PANEL
ALT: 21 U/L (ref 0–44)
AST: 23 U/L (ref 15–41)
Albumin: 4 g/dL (ref 3.5–5.0)
Alkaline Phosphatase: 82 U/L (ref 38–126)
Anion gap: 8 (ref 5–15)
BUN: 20 mg/dL (ref 8–23)
CO2: 29 mmol/L (ref 22–32)
Calcium: 9.3 mg/dL (ref 8.9–10.3)
Chloride: 101 mmol/L (ref 98–111)
Creatinine, Ser: 0.86 mg/dL (ref 0.61–1.24)
GFR, Estimated: >60 mL/min (ref >60)
Glucose, Bld: 99 mg/dL (ref 70–99)
Potassium: 4.1 mmol/L (ref 3.5–5.1)
Sodium: 138 mmol/L (ref 135–145)
Total Bilirubin: 0.5 mg/dL (ref 0.0–1.2)
Total Protein: 7.4 g/dL (ref 6.5–8.1)

## 2024-01-12 LAB — CBC
HCT: 39.6 % (ref 39.0–52.0)
Hemoglobin: 12.7 g/dL — ABNORMAL LOW (ref 13.0–17.0)
MCH: 29.9 pg (ref 26.0–34.0)
MCHC: 32.1 g/dL (ref 30.0–36.0)
MCV: 93.2 fL (ref 80.0–100.0)
Platelets: 171 10*3/uL (ref 150–400)
RBC: 4.25 MIL/uL (ref 4.22–5.81)
RDW: 13.4 % (ref 11.5–15.5)
WBC: 6.2 10*3/uL (ref 4.0–10.5)
nRBC: 0 % (ref 0.0–0.2)

## 2024-01-12 LAB — LIPASE, BLOOD: Lipase: 30 U/L (ref 11–51)

## 2024-01-12 MED ORDER — IOHEXOL 300 MG/ML  SOLN
100.0000 mL | Freq: Once | INTRAMUSCULAR | Status: AC | PRN
  Administered 2024-01-12: 100 mL via INTRAVENOUS

## 2024-01-12 MED ORDER — HYOSCYAMINE SULFATE 0.125 MG SL SUBL: 0.1250 mg | SUBLINGUAL_TABLET | SUBLINGUAL | 0 refills | Status: DC | PRN

## 2024-01-12 MED ORDER — FENTANYL CITRATE PF 50 MCG/ML IJ SOSY
50.0000 ug | PREFILLED_SYRINGE | Freq: Once | INTRAMUSCULAR | Status: AC
  Administered 2024-01-12: 50 ug via INTRAVENOUS
  Filled 2024-01-12: qty 1

## 2024-01-12 MED ORDER — ONDANSETRON HCL 4 MG/2ML IJ SOLN
4.0000 mg | Freq: Once | INTRAMUSCULAR | Status: AC
  Administered 2024-01-12: 4 mg via INTRAVENOUS
  Filled 2024-01-12: qty 2

## 2024-01-12 NOTE — ED Provider Notes (Signed)
[place] EMERGENCY DEPARTMENT AT [facility] Provider Note   CSN: [id number] Arrival date & time: 01/12/24  1642     History  Chief Complaint  Patient presents with   Abdominal Pain    Todd Estrada is a 63 y.o. male.  63 year old male with past medical history of CVA with expressive aphasia presents emergency department today with left-sided abdominal pain.  I will further evaluate patient here with basic labs including LFTs and a lipase to evaluate for hepatobiliary pathology or pancreatitis.  Will obtain a urinalysis for further evaluation for urinary tract infection.  I will give patient pain medications and reevaluate for ultimate disposition.   Abdominal Pain 63 year old male with past medical history of CVA with expressive aphasia as well as fibrillation on Eliquis presenting to the emergency department today complaining of abdominal pain.  The patient is pointing to his left lower quadrant with this.  He was apparently evaluated few days ago for rectal pain.  He is denying rectal pain when asked yes or no.  He is pointing to his left lower quadrant.  In his triage note there was mention of vomiting but the patient is denying this when asked him specifically.  He is also denying any diarrhea.  It is difficult to obtain history from the patient as he is aphasic from previous stroke.     Home Medications Prior to Admission medications   Medication Sig Start Date End Date Taking? Authorizing Provider  hyoscyamine (LEVSIN/SL) 0.125 MG SL tablet Place 1 tablet (0.125 mg total) under the tongue every 4 (four) hours as needed. 01/12/24  Yes Durwin Glaze, MD  albuterol (PROVENTIL) (2.5 MG/3ML) 0.083% nebulizer solution Take 3 mLs (2.5 mg total) by nebulization every 4 (four) hours as needed for wheezing or shortness of breath. 08/16/22   Steffanie Rainwater, MD  apixaban (ELIQUIS) 5 MG TABS tablet TAKE 1 TABLET(5 MG) BY MOUTH TWICE DAILY Patient taking differently: Take 5 mg  by mouth 2 (two) times daily. 04/11/23   Briscoe Burns, MD  atorvastatin (LIPITOR) 40 MG tablet Take 1 tablet (40 mg total) by mouth daily. 01/03/23 01/03/24  Briscoe Burns, MD  budesonide (PULMICORT) 0.5 MG/2ML nebulizer solution NEW PRESCRIPTION REQEUST: BUDESONIDE 0.5 MG/ - USE ONE VIAL TWICE DAILY Patient not taking: Reported on 08/19/2023 11/29/22   Martina Sinner, MD  cephALEXin (KEFLEX) 500 MG capsule Take 1 capsule (500 mg total) by mouth 4 (four) times daily. 10/17/23   Durwin Glaze, MD  cyclobenzaprine (FLEXERIL) 10 MG tablet Take 1 tablet (10 mg total) by mouth 2 (two) times daily as needed for muscle spasms. 11/19/23   Sloan Leiter, DO  hydrocortisone (ANUSOL-HC) 25 MG suppository Place 1 suppository (25 mg total) rectally 2 (two) times daily. For 7 days 12/10/23   Pricilla Loveless, MD  levETIRAcetam (KEPPRA) 500 MG tablet Take 1 tablet (500 mg total) by mouth 2 (two) times daily. 10/30/23   Ihor Austin, NP  midodrine (PROAMATINE) 2.5 MG tablet TAKE 1 TABLET(2.5 MG) BY MOUTH THREE TIMES DAILY WITH MEALS 01/02/24 04/01/24  [name], MD  Multiple Vitamin (MULTIVITAMIN WITH MINERALS) TABS tablet Take 1 tablet by mouth daily.    [provider]  oxyCODONE-acetaminophen (PERCOCET) 5-325 MG tablet Take 1 tablet by mouth every 6 (six) hours as needed. 09/20/23   Atway, Rayann N, DO  oxyCODONE-acetaminophen (PERCOCET/ROXICET) 5-325 MG tablet Take 1 tablet by mouth every 6 (six) hours as needed for severe pain (pain score  7-10). 10/17/23   Durwin Glaze, MD  pantoprazole (PROTONIX) 40 MG tablet TAKE 1 TABLET(40 MG) BY MOUTH DAILY 11/28/23   [name], MD  sucralfate (CARAFATE) 1 GM/10ML suspension Take 10 mLs (1 g total) by mouth 4 (four) times daily -  with meals and at bedtime. 07/02/23   Nira Conn, MD  Tiotropium Bromide-Olodaterol (STIOLTO RESPIMAT) 2.5-2.5 MCG/ACT AERS NEW PRESCRIPTION REQUEST: STIOLTO 2.5 MCG- INHALE TWO PUFFS BY MOUTH DAILY Patient  taking differently: Inhale 2 puffs into the lungs daily. 11/29/22   Martina Sinner, MD      Allergies    Patient has no known allergies.    Review of Systems   Review of Systems  Reason unable to perform ROS: Expressive aphasia.  Gastrointestinal:  Positive for abdominal pain.    Physical Exam Updated Vital Signs BP 133/83   Pulse 76   Temp 98.2 F (36.8 C)   Resp 14   SpO2 99%  Physical Exam Vitals and nursing note reviewed.   Gen: NAD Eyes: PERRL, EOMI HEENT: no oropharyngeal swelling Neck: trachea midline Resp: clear to auscultation bilaterally Card: RRR, no murmurs, rubs, or gallops Abd: The patient is tender over the left lower quadrant without guarding or rebound Extremities: no calf tenderness, no edema Vascular: 2+ radial pulses bilaterally, 2+ DP pulses bilaterally Neuro: Expressive aphasia with right-sided deficits noted Skin: no rashes Psyc: acting appropriately   ED Results / Procedures / Treatments   Labs (all labs ordered are listed, but only abnormal results are displayed) Labs Reviewed  CBC - Abnormal; Notable for the following components:      Result Value   Hemoglobin 12.7 (*)    All other components within normal limits  URINALYSIS, ROUTINE W REFLEX MICROSCOPIC - Abnormal; Notable for the following components:   Protein, ur 30 (*)    All other components within normal limits  LIPASE, BLOOD  COMPREHENSIVE METABOLIC PANEL    EKG None  Radiology US Scrotum Result Date: 01/12/2024 CLINICAL DATA:  Left-sided testicular pain EXAM: SCROTAL ULTRASOUND DOPPLER ULTRASOUND OF THE TESTICLES TECHNIQUE: Complete ultrasound examination of the testicles, epididymis, and other scrotal structures was performed. Color and spectral Doppler ultrasound were also utilized to evaluate blood flow to the testicles. COMPARISON:  None Available. FINDINGS: Right testicle Measurements: 4.1 x 1.8 x 2.1 cm. No mass or microlithiasis visualized. Left testicle  Measurements: 3.7 x 1.8 x 2.4 cm. No mass or microlithiasis visualized. Right epididymis:  1 cm epididymal cyst is noted. Left epididymis:  Normal in size and appearance. Hydrocele:  Mildly complex right-sided hydrocele is noted. Varicocele:  None visualized. Pulsed Doppler interrogation of both testes demonstrates normal low resistance arterial and venous waveforms bilaterally. IMPRESSION: No evidence of testicular torsion. Right hydrocele. Right epididymal cyst. Electronically Signed   By: Alcide Clever M.D.   On: 01/12/2024 22:44   CT ABDOMEN PELVIS W CONTRAST Result Date: 01/12/2024 CLINICAL DATA:  Left lower quadrant abdominal pain EXAM: CT ABDOMEN AND PELVIS WITH CONTRAST TECHNIQUE: Multidetector CT imaging of the abdomen and pelvis was performed using the standard protocol following bolus administration of intravenous contrast. RADIATION DOSE REDUCTION: This exam was performed according to the departmental dose-optimization program which includes automated exposure control, adjustment of the mA and/or kV according to patient size and/or use of iterative reconstruction technique. CONTRAST:  OMNIPAQUE IOHEXOL 300 MG/ML  SOLN COMPARISON:  Pelvis CT 12/10/2023.  CT abdomen and pelvis 10/04/2023 FINDINGS: Lower chest: Emphysematous changes and scarring are noted in the  lung bases. There is elevation of the left hemidiaphragm. There is a 3 mm left lower lobe nodule image 6/3. Hepatobiliary: No focal liver abnormality is seen. Status post cholecystectomy. No biliary dilatation. Stable pneumobilia present likely related to prior surgery. Pancreas: Unremarkable. No pancreatic ductal dilatation or surrounding inflammatory changes. Spleen: Normal in size without focal abnormality. Adrenals/Urinary Tract: Adrenal glands are unremarkable. Kidneys are normal, without renal calculi, focal lesion, or hydronephrosis. Bladder is unremarkable. Stomach/Bowel: Stomach is within normal limits. Appendix appears normal. No  evidence of bowel wall thickening, distention, or inflammatory changes. Vascular/Lymphatic: Aortic and iliac artery atherosclerosis. No enlarged abdominal or pelvic lymph nodes. Reproductive: Prostate is unremarkable. Other: There is questionable high riding left testicle. No focal hernia or ascites. Musculoskeletal: No acute or significant osseous findings. IMPRESSION: 1. No acute localizing process in the abdomen or pelvis. 2. Questionable high riding left testicle. Correlate clinically. 3. 3 mm left solid pulmonary nodule. No follow-up needed if patient is low-risk.This recommendation follows the consensus statement: Guidelines for Management of Incidental Pulmonary Nodules Detected on CT Images: From the Fleischner Society 2017; Radiology 2017; 284:228-243. Aortic Atherosclerosis (ICD10-I70.0) and Emphysema (ICD10-J43.9). Electronically Signed   By: Darliss Cheney M.D.   On: 01/12/2024 20:18    Procedures Procedures    Medications Ordered in ED Medications  fentaNYL (SUBLIMAZE) injection 50 mcg (50 mcg Intravenous Given 01/12/24 1813)  ondansetron (ZOFRAN) injection 4 mg (4 mg Intravenous Given 01/12/24 1813)  iohexol (OMNIPAQUE) 300 MG/ML solution 100 mL (100 mLs Intravenous Contrast Given 01/12/24 1953)    ED Course/ Medical Decision Making/ A&P                                 Medical Decision Making 63 year old male with past medical history of CVA with expressive aphasia as well as atrial fibrillation on Eliquis presenting to the emergency department today with abdominal pain.  I will further evaluate the patient here with basic labs as well as a CT scan of his abdomen for further evaluation for appendicitis, diverticulitis, colitis, perforated viscus, or other intra-abdominal pathology.  I will give the patient fentanyl and Zofran for symptoms and reevaluate for ultimate disposition.  Patient CT scan is unremarkable.  There was concern for possible high riding testicle on the left.   Ultrasound was performed and there is no evidence of torsion.  Urinalysis is negative.  The patient is discharged with return precautions.  Amount and/or Complexity of Data Reviewed Labs: ordered. Radiology: ordered.  Risk Prescription drug management.           Final Clinical Impression(s) / ED Diagnoses Final diagnoses:  Abdominal pain, unspecified abdominal location    Rx / DC Orders ED Discharge Orders          Ordered    hyoscyamine (LEVSIN/SL) 0.125 MG SL tablet  Every 4 hours PRN        01/12/24 2310              Durwin Glaze, MD 01/12/24 2312

## 2024-01-12 NOTE — ED Triage Notes (Signed)
Pt here from home with c/o abd pain no n/v/d , onset 2 hrs ago

## 2024-01-12 NOTE — Discharge Instructions (Signed)
Your workup today was reassuring.  Please follow-up with your doctor and return to the ER for worsening symptoms.

## 2024-01-29 ENCOUNTER — Ambulatory Visit: Payer: Medicaid Other | Admitting: Student

## 2024-01-29 ENCOUNTER — Encounter: Payer: Self-pay | Admitting: Student

## 2024-01-29 VITALS — BP 141/80 | HR 71 | Temp 98.2°F | Ht 72.0 in

## 2024-01-29 DIAGNOSIS — I63512 Cerebral infarction due to unspecified occlusion or stenosis of left middle cerebral artery: Secondary | ICD-10-CM

## 2024-01-29 DIAGNOSIS — I639 Cerebral infarction, unspecified: Secondary | ICD-10-CM | POA: Insufficient documentation

## 2024-01-29 DIAGNOSIS — I739 Peripheral vascular disease, unspecified: Secondary | ICD-10-CM | POA: Diagnosis not present

## 2024-01-29 DIAGNOSIS — R600 Localized edema: Secondary | ICD-10-CM | POA: Diagnosis present

## 2024-01-29 DIAGNOSIS — Z8673 Personal history of transient ischemic attack (TIA), and cerebral infarction without residual deficits: Secondary | ICD-10-CM

## 2024-01-29 DIAGNOSIS — R609 Edema, unspecified: Secondary | ICD-10-CM | POA: Insufficient documentation

## 2024-01-29 NOTE — Progress Notes (Signed)
 CC:   Chief Complaint  Patient presents with   Leg Problem    Pain and swelling right leg    HPI:  Todd Estrada is a 63 y.o. 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 Please see problem based assessment and plan for additional details.  Past Medical History:  Diagnosis Date   Asthma    Atypical chest pain 08/13/2022   Community acquired pneumonia 09/14/2022   COPD (chronic obstructive pulmonary disease) (HCC)    Essential hypertension 08/19/2021   GERD (gastroesophageal reflux disease)    HAP (hospital-acquired pneumonia) 09/16/2022   History of tracheostomy    03/09/22-04/11/22   HLD (hyperlipidemia)    Hypertension    Hypokalemia 08/13/2022   Hypomagnesemia 08/13/2022   Lung cancer (HCC)    PAD (peripheral artery disease) (HCC)    Seizures (HCC) 06/02/2022   Sepsis (HCC) 08/13/2022   Sepsis due to pneumonia (HCC) 04/13/2023   Stroke (HCC) 02/2022    Current Outpatient Medications on File Prior to Visit  Medication Sig Dispense Refill   albuterol  (PROVENTIL ) (2.5 MG/3ML) 0.083% nebulizer solution Take 3 mLs (2.5 mg total) by nebulization every 4 (four) hours as needed for wheezing or shortness of breath. 90 mL 12   apixaban  (ELIQUIS ) 5 MG TABS tablet TAKE 1 TABLET(5 MG) BY MOUTH TWICE DAILY (Patient taking differently: Take 5 mg by mouth 2 (two) times daily.) 180 tablet 2   atorvastatin  (LIPITOR) 40 MG tablet Take 1 tablet (40 mg total) by mouth daily. 90 tablet 3   budesonide  (PULMICORT ) 0.5 MG/2ML nebulizer solution NEW PRESCRIPTION REQEUST: BUDESONIDE  0.5 MG/ 2ML- USE ONE VIAL TWICE DAILY (Patient not taking: Reported on 08/19/2023) 180 mL 3   hydrocortisone  (ANUSOL -HC) 25 MG suppository Place 1 suppository (25 mg total) rectally 2 (two) times daily. For 7 days 14 suppository 0   hyoscyamine  (LEVSIN/SL) 0.125 MG SL tablet  Place 1 tablet (0.125 mg total) under the tongue every 4 (four) hours as needed. 30 tablet 0   levETIRAcetam  (KEPPRA ) 500 MG tablet Take 1 tablet (500 mg total) by mouth 2 (two) times daily. 120 tablet 0   midodrine  (PROAMATINE ) 2.5 MG tablet TAKE 1 TABLET(2.5 MG) BY MOUTH THREE TIMES DAILY WITH MEALS 90 tablet 2   Multiple Vitamin (MULTIVITAMIN WITH MINERALS) TABS tablet Take 1 tablet by mouth daily.     pantoprazole  (PROTONIX ) 40 MG tablet TAKE 1 TABLET(40 MG) BY MOUTH DAILY 90 tablet 0   Tiotropium Bromide-Olodaterol (STIOLTO RESPIMAT ) 2.5-2.5 MCG/ACT AERS NEW PRESCRIPTION REQUEST: STIOLTO 2.5 MCG- INHALE TWO PUFFS BY MOUTH DAILY (Patient taking differently: Inhale 2 puffs into the lungs daily.) 12 g 3   No current facility-administered medications on file prior to visit.    Family History  Problem Relation Age of Onset   Throat cancer Mother    Liver cancer Father    Kidney failure Sister    Cancer - Lung Paternal Uncle     Social History   Socioeconomic History   Marital status: Widowed    Spouse name: Not on file   Number of children: Not on file   Years of education: Not on file   Highest education level: Not on file  Occupational History   Not on file  Tobacco Use   Smoking status: Former    Current packs/day: 0.00    Types: Cigarettes  Quit date: 05/02/2022    Years since quitting: 1.7    Passive exposure: Never   Smokeless tobacco: Never  Vaping Use   Vaping status: Never Used  Substance and Sexual Activity   Alcohol use: Not Currently   Drug use: No   Sexual activity: Not Currently  Other Topics Concern   Not on file  Social History Narrative   Not on file   Social Drivers of Health   Financial Resource Strain: Not on file  Food Insecurity: No Food Insecurity (01/29/2024)   Hunger Vital Sign    Worried About Running Out of Food in the Last Year: Never true    Ran Out of Food in the Last Year: Never true  Transportation Needs: No Transportation Needs  (01/29/2024)   PRAPARE - Administrator, Civil Service (Medical): No    Lack of Transportation (Non-Medical): No  Physical Activity: Not on file  Stress: Not on file  Social Connections: Socially Isolated (01/06/2023)   Social Connection and Isolation Panel [NHANES]    Frequency of Communication with Friends and Family: Once a week    Frequency of Social Gatherings with Friends and Family: Once a week    Attends Religious Services: Never    Database administrator or Organizations: No    Attends Banker Meetings: Never    Marital Status: Widowed  Intimate Partner Violence: Not At Risk (07/05/2023)   Humiliation, Afraid, Rape, and Kick questionnaire    Fear of Current or Ex-Partner: No    Emotionally Abused: No    Physically Abused: No    Sexually Abused: No    Review of Systems: ROS negative except for what is noted on the assessment and plan.  Vitals:   01/29/24 1306 01/29/24 1355  BP: (!) 143/84 (!) 141/80  Pulse: 75 71  Temp: 98.2 F (36.8 C)   TempSrc: Oral   SpO2: 99%   Height: 6' (1.829 m)    Physical Exam: Constitutional: ill appearing in NAD, sitting on a wheelchair HENT: normocephalic atraumatic, mucous membranes moist Eyes: conjunctiva non-erythematous Cardiovascular: regular rate and rhythm, no m/r/g Pulmonary/Chest: normal work of breathing on room air, lungs clear to auscultation bilaterally Abdominal: soft, non-tender, non-distended MSK: 0/5 movement on the right extremity, 1/5 motor strength on the right lower extremity. Edema on the R>L, pitting 2+ R 1+ L. DP pulses not palpable. Posterior tibialis pulses not palpable. No redness warmth erythema noted.  Neurological: alert & oriented x 3, no focal deficit Skin: warm and dry Psych: normal mood and behavior  Assessment & Plan:   Patient seen with Dr. 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 Used to have physical therapy but has not been since. Would benefit from additional therapy as this has been limiting his mobility and ability to do ADLS.   -Referral to PT.   PAD (peripheral artery disease) (HCC) Has history of severe PAD, on Lipitor 40mg . Was referred to vascular surgery but has been lost to follow up. Would benefit from seeing them to manage. 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 ABIs abnormal in the past, I am unable to feel his dorsalis pedis or posterior tibialis pulses on exam today.   -cw Atorvastatin  40mg   -pt stopped smoking  -PT referral  -Referral to vascular surgery  Edema Asymmetric on the Right left worse than on the left. Painful, describes as dull. Started on 01/12/2024 and was told to follow up with PCP. Concern for DVT. Pt is high risk for a clot, has hx of lung cancer and CVA that limits mobility however, he is already on Eliquis  for his atrial fibrillation. States he has been taking it and has not missed doses. States he has had some mild increase in SOB lately but people have been sick around him. Lungs clear to auscultation and normal work of breathing today. Not tachycardic. Educated patient on signs of PE, and gave him strict precautions to go to the ED should he develop symptoms of it. Stat Doppler of the RLE.   Jose Ngo, MD [facility] Internal Medicine, PGY-1 Phone: [phone] Date 01/29/2024 Time 2:17 PM

## 2024-01-29 NOTE — Patient Instructions (Signed)
 Thank you, [name] for allowing us  to provide your care today. Today we discussed the possibility of you having a clot in your right leg. I  have ordered an emergent ultrasound of your leg so we can see if you have a clot there. You are already taking Eliquis , and it may be that you need to change anticoagulation treatments.   Please head to the ED if you develop shortness of breath or your heart starts to race or symptoms of a pulmonary embolism (attached information sheet for you).   For your pain please take Tylenol  up to 1000mg  every 6 hours as needed for now. I will be calling you with the results.   Referrals ordered today:   Referral Orders         Ambulatory referral to Vascular Surgery         Ambulatory referral to Physical Therapy       Should you have any questions or concerns please call the internal medicine clinic at [phone].    Jose Ngo, MD [facility]

## 2024-01-29 NOTE — Assessment & Plan Note (Signed)
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 Used to have physical therapy but has not been since. Would benefit from additional therapy as this has been limiting his mobility and ability to do ADLS.   -Referral to PT.

## 2024-01-29 NOTE — Assessment & Plan Note (Signed)
 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 Used to have physical therapy but has not been since. Would benefit from additional therapy as this has been limiting his mobility and ability to do ADLS.   -Referral to PT.

## 2024-01-29 NOTE — Assessment & Plan Note (Signed)
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 Used to have physical therapy but has not been since. Would benefit from additional therapy as this has been limiting his mobility and ability to do ADLS.   -Referral to PT.

## 2024-01-29 NOTE — Assessment & Plan Note (Signed)
 Has history of severe PAD, on Lipitor 40mg . Was referred to vascular surgery but has been lost to follow up. Would benefit from seeing them to manage. 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 ABIs abnormal in the past, I am unable to feel his dorsalis pedis or posterior tibialis pulses on exam today.   -cw Atorvastatin  40mg   -pt stopped smoking  -PT referral  -Referral to vascular surgery

## 2024-01-29 NOTE — Assessment & Plan Note (Signed)
 Asymmetric on the Right left worse than on the left. Painful, describes as dull. Started on 01/12/2024 and was told to follow up with PCP. Concern for DVT. Pt is high risk for a clot, has hx of lung cancer and CVA that limits mobility however, he is already on Eliquis  for his atrial fibrillation. States he has been taking it and has not missed doses. States he has had some mild increase in SOB lately but people have been sick around him. Lungs clear to auscultation and normal work of breathing today. Not tachycardic. Educated patient on signs of PE, and gave him strict precautions to go to the ED should he develop symptoms of it. Stat Doppler of the RLE.

## 2024-01-30 NOTE — Progress Notes (Signed)
Internal Medicine Clinic Attending  I was physically present during the key portions of the resident provided service and participated in the medical decision making of patient's management care. I reviewed pertinent patient test results.  The assessment, diagnosis, and plan were formulated together and I agree with the documentation in the resident's note.  Reymundo Poll, MD

## 2024-01-30 NOTE — Addendum Note (Signed)
Addended by: Burnell Blanks on: 01/30/2024 02:07 PM   Modules accepted: Level of Service

## 2024-02-01 ENCOUNTER — Ambulatory Visit (HOSPITAL_COMMUNITY)
Admission: RE | Admit: 2024-02-01 | Discharge: 2024-02-01 | Disposition: A | Discharge status: Home / Self Care | Payer: Medicaid Other | Source: Ambulatory Visit | Attending: Internal Medicine | Admitting: Internal Medicine

## 2024-02-01 DIAGNOSIS — R600 Localized edema: Secondary | ICD-10-CM

## 2024-02-01 NOTE — Progress Notes (Signed)
Lower extremity venous duplex completed. Please see CV Procedures for preliminary results.  Shona Simpson, RVT 02/01/24 10:17 AM

## 2024-02-06 ENCOUNTER — Other Ambulatory Visit: Payer: Self-pay

## 2024-02-06 DIAGNOSIS — J432 Centrilobular emphysema: Secondary | ICD-10-CM

## 2024-02-06 MED ORDER — ATORVASTATIN CALCIUM 40 MG PO TABS: 40.0000 mg | ORAL_TABLET | Freq: Every day | ORAL | 3 refills | Status: DC

## 2024-02-07 MED ORDER — STIOLTO RESPIMAT 2.5-2.5 MCG/ACT IN AERS: INHALATION_SPRAY | RESPIRATORY_TRACT | 3 refills | Status: DC

## 2024-03-01 ENCOUNTER — Ambulatory Visit (HOSPITAL_BASED_OUTPATIENT_CLINIC_OR_DEPARTMENT_OTHER)
Admission: RE | Admit: 2024-03-01 | Discharge: 2024-03-01 | Disposition: A | Discharge status: Home / Self Care | Payer: Medicaid Other | Source: Ambulatory Visit | Attending: Internal Medicine | Admitting: Internal Medicine

## 2024-03-01 DIAGNOSIS — C3490 Malignant neoplasm of unspecified part of unspecified bronchus or lung: Secondary | ICD-10-CM | POA: Diagnosis present

## 2024-03-01 MED ORDER — IOHEXOL 300 MG/ML  SOLN
75.0000 mL | Freq: Once | INTRAMUSCULAR | Status: AC | PRN
  Administered 2024-03-01: 75 mL via INTRAVENOUS

## 2024-03-03 ENCOUNTER — Other Ambulatory Visit: Payer: Self-pay | Admitting: Student

## 2024-03-04 NOTE — Telephone Encounter (Signed)
 Medication sent to pharmacy

## 2024-03-08 ENCOUNTER — Encounter: Payer: Self-pay | Admitting: Vascular Surgery

## 2024-03-26 ENCOUNTER — Other Ambulatory Visit: Payer: Self-pay

## 2024-03-26 DIAGNOSIS — Z86711 Personal history of pulmonary embolism: Secondary | ICD-10-CM

## 2024-03-31 ENCOUNTER — Emergency Department (HOSPITAL_COMMUNITY)
Admission: EM | Admit: 2024-03-31 | Discharge: 2024-04-01 | Disposition: A | Discharge status: Home / Self Care | Attending: Emergency Medicine | Admitting: Emergency Medicine

## 2024-03-31 DIAGNOSIS — K6289 Other specified diseases of anus and rectum: Secondary | ICD-10-CM | POA: Insufficient documentation

## 2024-03-31 DIAGNOSIS — J449 Chronic obstructive pulmonary disease, unspecified: Secondary | ICD-10-CM | POA: Insufficient documentation

## 2024-03-31 DIAGNOSIS — J45909 Unspecified asthma, uncomplicated: Secondary | ICD-10-CM | POA: Diagnosis not present

## 2024-03-31 DIAGNOSIS — M7918 Myalgia, other site: Secondary | ICD-10-CM | POA: Diagnosis not present

## 2024-03-31 DIAGNOSIS — Z85118 Personal history of other malignant neoplasm of bronchus and lung: Secondary | ICD-10-CM | POA: Diagnosis not present

## 2024-03-31 DIAGNOSIS — I10 Essential (primary) hypertension: Secondary | ICD-10-CM | POA: Diagnosis not present

## 2024-03-31 NOTE — ED Triage Notes (Signed)
 Pt arrives with caregiver- c/o right side buttock pain, denies back pain. Pt has hx of CVA with right side deficit, only able to communicate with yes or no.

## 2024-04-01 ENCOUNTER — Encounter (HOSPITAL_COMMUNITY): Payer: Self-pay | Admitting: Emergency Medicine

## 2024-04-01 ENCOUNTER — Emergency Department (HOSPITAL_COMMUNITY)

## 2024-04-01 ENCOUNTER — Other Ambulatory Visit: Payer: Self-pay

## 2024-04-01 LAB — CBC WITH DIFFERENTIAL/PLATELET
Abs Immature Granulocytes: 0.02 10*3/uL (ref 0.00–0.07)
Basophils Absolute: 0 10*3/uL (ref 0.0–0.1)
Basophils Relative: 0 %
Eosinophils Absolute: 0.2 10*3/uL (ref 0.0–0.5)
Eosinophils Relative: 2 %
HCT: 40.3 % (ref 39.0–52.0)
Hemoglobin: 12.9 g/dL — ABNORMAL LOW (ref 13.0–17.0)
Immature Granulocytes: 0 %
Lymphocytes Relative: 14 %
Lymphs Abs: 1.1 10*3/uL (ref 0.7–4.0)
MCH: 29.9 pg (ref 26.0–34.0)
MCHC: 32 g/dL (ref 30.0–36.0)
MCV: 93.5 fL (ref 80.0–100.0)
Monocytes Absolute: 0.9 10*3/uL (ref 0.1–1.0)
Monocytes Relative: 11 %
Neutro Abs: 5.7 10*3/uL (ref 1.7–7.7)
Neutrophils Relative %: 73 %
Platelets: 145 10*3/uL — ABNORMAL LOW (ref 150–400)
RBC: 4.31 MIL/uL (ref 4.22–5.81)
RDW: 13 % (ref 11.5–15.5)
WBC: 7.9 10*3/uL (ref 4.0–10.5)
nRBC: 0 % (ref 0.0–0.2)

## 2024-04-01 LAB — COMPREHENSIVE METABOLIC PANEL WITH GFR
ALT: 18 U/L (ref 0–44)
AST: 19 U/L (ref 15–41)
Albumin: 4 g/dL (ref 3.5–5.0)
Alkaline Phosphatase: 90 U/L (ref 38–126)
Anion gap: 10 (ref 5–15)
BUN: 22 mg/dL (ref 8–23)
CO2: 27 mmol/L (ref 22–32)
Calcium: 9.3 mg/dL (ref 8.9–10.3)
Chloride: 103 mmol/L (ref 98–111)
Creatinine, Ser: 1.02 mg/dL (ref 0.61–1.24)
GFR, Estimated: >60 mL/min (ref >60)
Glucose, Bld: 108 mg/dL — ABNORMAL HIGH (ref 70–99)
Potassium: 3.6 mmol/L (ref 3.5–5.1)
Sodium: 140 mmol/L (ref 135–145)
Total Bilirubin: 0.7 mg/dL (ref 0.0–1.2)
Total Protein: 7.5 g/dL (ref 6.5–8.1)

## 2024-04-01 LAB — LIPASE, BLOOD: Lipase: 25 U/L (ref 11–51)

## 2024-04-01 MED ORDER — IOHEXOL 300 MG/ML  SOLN
100.0000 mL | Freq: Once | INTRAMUSCULAR | Status: AC | PRN
  Administered 2024-04-01: 100 mL via INTRAVENOUS

## 2024-04-01 MED ORDER — ACETAMINOPHEN 500 MG PO TABS
1000.0000 mg | ORAL_TABLET | Freq: Once | ORAL | Status: AC
  Administered 2024-04-01: 1000 mg via ORAL
  Filled 2024-04-01: qty 2

## 2024-04-01 NOTE — ED Notes (Signed)
 Call brother for pick up when patient is ready for discharge   tarig zimmers   [phone]

## 2024-04-01 NOTE — Discharge Instructions (Signed)
 1.  Your CT scan does not show any evidence of an abscess or infection in the area of pain.  You may have bruising or muscular pain.  Take extra strength Tylenol every 6 hours as needed.  You may apply warm moist heat to the area. 2.  Schedule follow-up with your doctor for recheck within the next 2 to 4 days.

## 2024-04-01 NOTE — ED Notes (Signed)
 Pt provided discharge instructions and prescription information. Pt was given the opportunity to ask questions and questions were answered.

## 2024-04-01 NOTE — ED Provider Notes (Signed)
 F/U CT for rectal pain. If negative, pain control and d/c. Physical Exam  BP (!) 163/96   Pulse 74   Temp 98.4 F (36.9 C) (Oral)   Resp 17   SpO2 97%   Physical Exam  Procedures  Procedures  ED Course / MDM    Medical Decision Making Amount and/or Complexity of Data Reviewed Labs: ordered. Radiology: ordered.  Risk OTC drugs. Prescription drug management.    CT scan returned and no acute findings.  At this time we will plan for discharge.  Recommendation is for warm compresses and extra strength Tylenol with close follow-up and recheck.      Wynetta Heckle, MD 04/01/24 1043

## 2024-04-01 NOTE — ED Provider Notes (Signed)
 Emergency Department Provider Note   I have reviewed the triage vital signs and the nursing notes.   HISTORY  Chief Complaint Rectal Pain   HPI Todd Estrada is a 63 y.o. male presents to the emergency department for evaluation of gluteal/rectal pain.  Symptoms ongoing for the past week.  Patient reports ongoing pain.  He is able to answer questions yes/no tells me he continues to have bowel movements.  No vomiting.  No fever. Point to gluteal area as the point of pain.   Level 5 caveat: CVA speech disturbance  Past Medical History:  Diagnosis Date   Asthma    Atypical chest pain 08/13/2022   Community acquired pneumonia 09/14/2022   COPD (chronic obstructive pulmonary disease) (HCC)    Essential hypertension 08/19/2021   GERD (gastroesophageal reflux disease)    HAP (hospital-acquired pneumonia) 09/16/2022   History of tracheostomy    03/09/22-04/11/22   HLD (hyperlipidemia)    Hypertension    Hypokalemia 08/13/2022   Hypomagnesemia 08/13/2022   Lung cancer (HCC)    PAD (peripheral artery disease) (HCC)    Seizures (HCC) 06/02/2022   Sepsis (HCC) 08/13/2022   Sepsis due to pneumonia (HCC) 04/13/2023   Stroke (HCC) 02/2022    Review of Systems  Level 5 caveat: CVA speech disturbance  ____________________________________________   PHYSICAL EXAM:  VITAL SIGNS: ED Triage Vitals  Encounter Vitals Group     BP 03/31/24 2202 (!) 124/96     Pulse Rate 03/31/24 2202 87     Resp 03/31/24 2202 18     Temp 03/31/24 2202 99.7 F (37.6 C)     Temp Source 04/01/24 0013 Oral     SpO2 03/31/24 2202 99 %   Constitutional: Alert. Well appearing and in no acute distress. Eyes: Conjunctivae are normal.  Head: Atraumatic. Nose: No congestion/rhinnorhea. Mouth/Throat: Mucous membranes are moist.   Neck: No stridor.  Cardiovascular: Normal rate, regular rhythm. Good peripheral circulation. Grossly normal heart sounds.   Respiratory: Normal respiratory effort.  No  retractions. Lungs CTAB. Gastrointestinal: Soft and nontender. No distention.  Rectal exam performed with patient's verbal consent and nurse chaperone at bedside.  I do not appreciate any cellulitis or visible gluteal abscess.  No perianal abscess.  Digital rectal exam performed without significant tenderness.  Musculoskeletal:  No gross deformities of extremities. Skin:  Skin is warm, dry and intact. No rash noted. ____________________________________________   LABS (all labs ordered are listed, but only abnormal results are displayed)  Labs Reviewed  COMPREHENSIVE METABOLIC PANEL WITH GFR - Abnormal; Notable for the following components:      Result Value   Glucose, Bld 108 (*)    All other components within normal limits  CBC WITH DIFFERENTIAL/PLATELET - Abnormal; Notable for the following components:   Hemoglobin 12.9 (*)    Platelets 145 (*)    All other components within normal limits  LIPASE, BLOOD    ____________________________________________   PROCEDURES  Procedure(s) performed:   Procedures  None  ____________________________________________   INITIAL IMPRESSION / ASSESSMENT AND PLAN / ED COURSE  Pertinent labs & imaging results that were available during my care of the patient were reviewed by me and considered in my medical decision making (see chart for details).   This patient is Presenting for Evaluation of rectal pain, which does require a range of treatment options, and is a complaint that involves a high risk of morbidity and mortality.  The Differential Diagnoses include gluteal abscess, perianal abscess, cellulitis, perirectal  abscess, etc.  Clinical Laboratory Tests Ordered, included CBC without leukocytosis. No AKI. Lipase negative.   Radiologic Tests: CT A/P pending.   Medical Decision Making: Summary:  Patient presents emergency department with gluteal/rectal pain.  History is somewhat difficult given his speech disturbance from prior stroke.   On exam I do not appreciate any obvious abscess.  No thrombosed hemorrhoids. Given history difficulty, plan for CT abdomen/pelvis and basic labs.   Reevaluation with update and discussion with patient. CT pending. Care transferred to Dr. Daivd Dub.    Patient's presentation is most consistent with acute presentation with potential threat to life or bodily function.   Disposition: pending  ____________________________________________  FINAL CLINICAL IMPRESSION(S) / ED DIAGNOSES  Final diagnoses:  Rectal pain  Buttock pain    Note:  This document was prepared using Dragon voice recognition software and may include unintentional dictation errors.  Abby Hocking, MD, [facility] Emergency Medicine    Ketsia Linebaugh, Shereen Dike, MD 04/04/24 [phone]

## 2024-04-07 MED ORDER — APIXABAN 5 MG PO TABS: 5.0000 mg | ORAL_TABLET | Freq: Two times a day (BID) | ORAL | 2 refills | Status: DC

## 2024-04-17 ENCOUNTER — Other Ambulatory Visit: Payer: Self-pay | Admitting: Student

## 2024-04-17 NOTE — Telephone Encounter (Signed)
 Copied from CRM [id number]. Topic: Clinical - Medication Refill >> Apr 17, 2024  2:18 PM Shelby Dessert H wrote: Most Recent Primary Care Visit:  Provider: Jose Ngo  Department: [facility]  Visit Type: OPEN ESTABLISHED  Date: 01/29/2024  Medication: levETIRAcetam  (KEPPRA ) 500 MG tablet  Has the patient contacted their pharmacy? Yes (Agent: If no, request that the patient contact the pharmacy for the refill. If patient does not wish to contact the pharmacy document the reason why and proceed with request.) (Agent: If yes, when and what did the pharmacy advise?)  Is this the correct pharmacy for this prescription?  If no, delete pharmacy and type the correct one.  This is the patient's preferred pharmacy:  [id number] [place], [place] - [address] AT [facility] & [address] [place] [address] Phone: [phone] Fax: [phone]  Has the prescription been filled recently? No  Is the patient out of the medication? Yes  Has the patient been seen for an appointment in the last year OR does the patient have an upcoming appointment? No  Can we respond through MyChart? Yes  Agent: Please be advised that Rx refills may take up to 3 business days. We ask that you follow-up with your pharmacy.

## 2024-04-17 NOTE — Telephone Encounter (Signed)
 Copied from CRM [id number]. Topic: Clinical - Medication Refill >> Apr 17, 2024  2:18 PM Shelby Dessert H wrote: Most Recent Primary Care Visit:  Provider: Jose Ngo  Department: [facility]  Visit Type: OPEN ESTABLISHED  Date: 01/29/2024  Medication: levETIRAcetam  (KEPPRA ) 500 MG tablet  Has the patient contacted their pharmacy? Yes (Agent: If no, request that the patient contact the pharmacy for the refill. If patient does not wish to contact the pharmacy document the reason why and proceed with request.) (Agent: If yes, when and what did the pharmacy advise?)  Is this the correct pharmacy for this prescription? Yes If no, delete pharmacy and type the correct one.  This is the patient's preferred pharmacy:  [facility] #[id number] [place], [place] - [address] AT [facility] & [address] [place] [address] Phone: [phone] Fax: [phone]  Has the prescription been filled recently? No  Is the patient out of the medication? Yes  Has the patient been seen for an appointment in the last year OR does the patient have an upcoming appointment? No  Can we respond through MyChart? Yes  Agent: Please be advised that Rx refills may take up to 3 business days. We ask that you follow-up with your pharmacy.

## 2024-04-18 MED ORDER — LEVETIRACETAM 500 MG PO TABS: 500.0000 mg | ORAL_TABLET | Freq: Two times a day (BID) | ORAL | 0 refills | Status: DC

## 2024-04-18 NOTE — Telephone Encounter (Signed)
 Will provide 90 day refill but will need follow up visit for future refills, last seen 11/2022, no showed visit 07/2023. Please contact patient to schedule follow up visit. Thank you.

## 2024-04-23 ENCOUNTER — Ambulatory Visit: Payer: Self-pay | Admitting: Student

## 2024-04-23 ENCOUNTER — Encounter: Payer: Self-pay | Admitting: Student

## 2024-04-23 VITALS — BP 108/68 | HR 109 | Temp 101.7°F | Ht 72.0 in | Wt 200.3 lb

## 2024-04-23 DIAGNOSIS — R051 Acute cough: Secondary | ICD-10-CM | POA: Diagnosis present

## 2024-04-23 DIAGNOSIS — R609 Edema, unspecified: Secondary | ICD-10-CM | POA: Diagnosis not present

## 2024-04-23 LAB — RESP PANEL BY RT-PCR (RSV, FLU A&B, COVID)  RVPGX2
Influenza A by PCR: NEGATIVE
Influenza B by PCR: NEGATIVE
Resp Syncytial Virus by PCR: NEGATIVE
SARS Coronavirus 2 by RT PCR: NEGATIVE

## 2024-04-23 MED ORDER — HYDROCOD POLI-CHLORPHE POLI ER 10-8 MG/5ML PO SUER: 5.0000 mL | Freq: Two times a day (BID) | ORAL | 0 refills | Status: DC | PRN

## 2024-04-23 NOTE — Assessment & Plan Note (Signed)
 Patient presents to the clinic with concerns of 4-day history of cough, congestion, sore throat.  He states he also has had a fever.  His cousin, Royston Cornea, has had the same symptoms.  His cousin has been tested for COVID, flu, RSV and was negative.  Patient denies any production of his cough.  He denies any shortness of breath.  He otherwise is doing fine.  On exam, patient does not have any oropharyngeal erythema.  No nasal cobblestoning.  Patient does have a coarse cough.  Likely this is a viral upper respiratory infection.  Will treat conservatively.  If patient does not improve, would potentially like to get chest x-ray to evaluate for lower lobe infiltrate.  With concern for fever of 101.7 F, will obtain CBC to evaluate for leukocytosis.  Plan: - Follow-up CBC with differential - Follow-up in 1 week if not improved - If not improved, obtain chest x-ray in 1 week - Tussionex for cough

## 2024-04-23 NOTE — Assessment & Plan Note (Signed)
 Patient continues to have lower extremity edema.  Does improve with elevation and compression.  He does not have compression stockings but would like some.  Will write order for compression stockings.  He does have follow-up with vascular to see if patient potentially needs intervention on any arteries in his legs.  Plan: - Compression stockings order provided to take to medical supply store.

## 2024-04-23 NOTE — Patient Instructions (Addendum)
 Todd Estrada you for allowing me to take part in your care today.  Here are your instructions.  1. Please go to the medical supply store to get your compression stockings.   2. I have sent a cough medicine to your pharmacy please pick it up.  3. I will call you with the lab results.   4. If you are not better in one week, please come back in one week.   [facility] [address], [place], [place] [address] [phone]  Thank you, Dr. Lydia Sams  If you have any other questions please contact the internal medicine clinic at [phone] If it is after hours, please call the [facility] at [phone] and then ask the person who picks up for the resident on call.

## 2024-04-23 NOTE — Progress Notes (Signed)
 CC: Cough and congestion  HPI:  [name] is a 63 y.o. male with a past medical history of heart failure with mildly reduced ejection fraction, prior CVA, PAD, lung cancer who presents for concerns of cough and congestion.  Please see assessment and plan for full HPI.  Medications: CVA: Eliquis  5 mg twice daily, Lipitor 40 mg daily Reactive airway disease: Albuterol , Pulmicort  nebs, Stiolto  Seizures: Keppra  500 mg twice daily GERD: Pantoprazole  40 mg daily  Past Medical History:  Diagnosis Date   Asthma    Atypical chest pain 08/13/2022   Community acquired pneumonia 09/14/2022   COPD (chronic obstructive pulmonary disease) (HCC)    Essential hypertension 08/19/2021   GERD (gastroesophageal reflux disease)    HAP (hospital-acquired pneumonia) 09/16/2022   History of tracheostomy    03/09/22-04/11/22   HLD (hyperlipidemia)    Hypertension    Hypokalemia 08/13/2022   Hypomagnesemia 08/13/2022   Lung cancer (HCC)    PAD (peripheral artery disease) (HCC)    Seizures (HCC) 06/02/2022   Sepsis (HCC) 08/13/2022   Sepsis due to pneumonia (HCC) 04/13/2023   Stroke (HCC) 02/2022     Current Outpatient Medications:    chlorpheniramine-HYDROcodone (TUSSIONEX) 10-8 MG/5ML, Take 5 mLs by mouth every 12 (twelve) hours as needed for cough., Disp: 115 mL, Rfl: 0   albuterol  (PROVENTIL ) (2.5 MG/3ML) 0.083% nebulizer solution, Take 3 mLs (2.5 mg total) by nebulization every 4 (four) hours as needed for wheezing or shortness of breath., Disp: 90 mL, Rfl: 12   apixaban  (ELIQUIS ) 5 MG TABS tablet, Take 1 tablet (5 mg total) by mouth 2 (two) times daily., Disp: 180 tablet, Rfl: 2   atorvastatin  (LIPITOR) 40 MG tablet, Take 1 tablet (40 mg total) by mouth daily., Disp: 90 tablet, Rfl: 3   budesonide  (PULMICORT ) 0.5 MG/2ML nebulizer solution, NEW PRESCRIPTION REQEUST: BUDESONIDE  0.5 MG/ 2ML- USE ONE VIAL TWICE DAILY (Patient not taking: Reported on 08/19/2023), Disp: 180 mL, Rfl: 3    hydrocortisone  (ANUSOL -HC) 25 MG suppository, Place 1 suppository (25 mg total) rectally 2 (two) times daily. For 7 days, Disp: 14 suppository, Rfl: 0   hyoscyamine  (LEVSIN/SL) 0.125 MG SL tablet, Place 1 tablet (0.125 mg total) under the tongue every 4 (four) hours as needed., Disp: 30 tablet, Rfl: 0   levETIRAcetam  (KEPPRA ) 500 MG tablet, Take 1 tablet (500 mg total) by mouth 2 (two) times daily., Disp: 180 tablet, Rfl: 0   Multiple Vitamin (MULTIVITAMIN WITH MINERALS) TABS tablet, Take 1 tablet by mouth daily., Disp: , Rfl:    pantoprazole  (PROTONIX ) 40 MG tablet, TAKE 1 TABLET(40 MG) BY MOUTH DAILY, Disp: 90 tablet, Rfl: 0   Tiotropium Bromide-Olodaterol (STIOLTO RESPIMAT ) 2.5-2.5 MCG/ACT AERS, NEW PRESCRIPTION REQUEST: STIOLTO 2.5 MCG- INHALE TWO PUFFS BY MOUTH DAILY, Disp: 12 g, Rfl: 3  Review of Systems:    Constitutional: Patient endorses fever and sore throat Respiratory: Patient endorses cough  Physical Exam:  Vitals:   04/23/24 1441  BP: 108/68  Pulse: (!) 109  Temp: (!) 101.7 F (38.7 C)  TempSrc: Oral  SpO2: 95%  Weight: 200 lb 4.8 oz (90.9 kg)  Height: 6' (1.829 m)   General: Patient is sitting comfortably in the room  Head: Normocephalic, atraumatic  Cardio: Tachycardic rate and rhythm, no murmurs, rubs or gallops Pulmonary: Clear to ausculation bilaterally with no rales, rhonchi, and crackles  Extremities: 1+ pitting edema to bilateral lower extremities with right greater than left  Assessment & Plan:   Acute cough Patient  presents to the clinic with concerns of 4-day history of cough, congestion, sore throat.  He states he also has had a fever.  His cousin, Royston Cornea, has had the same symptoms.  His cousin has been tested for COVID, flu, RSV and was negative.  Patient denies any production of his cough.  He denies any shortness of breath.  He otherwise is doing fine.  On exam, patient does not have any oropharyngeal erythema.  No nasal cobblestoning.  Patient does have  a coarse cough.  Likely this is a viral upper respiratory infection.  Will treat conservatively.  If patient does not improve, would potentially like to get chest x-ray to evaluate for lower lobe infiltrate.  With concern for fever of 101.7 F, will obtain CBC to evaluate for leukocytosis.  Plan: - Follow-up CBC with differential - Follow-up in 1 week if not improved - If not improved, obtain chest x-ray in 1 week - Tussionex for cough  Edema Patient continues to have lower extremity edema.  Does improve with elevation and compression.  He does not have compression stockings but would like some.  Will write order for compression stockings.  He does have follow-up with vascular to see if patient potentially needs intervention on any arteries in his legs.  Plan: - Compression stockings order provided to take to medical supply store.  Patient discussed with Dr.  Sonia Durand, DO PGY-2 Internal Medicine Resident

## 2024-04-24 ENCOUNTER — Telehealth: Payer: Self-pay | Admitting: Student

## 2024-04-24 DIAGNOSIS — R051 Acute cough: Secondary | ICD-10-CM

## 2024-04-24 LAB — CBC WITH DIFFERENTIAL/PLATELET
Basophils Absolute: 0 10*3/uL (ref 0.0–0.2)
Basos: 0 %
EOS (ABSOLUTE): 0 10*3/uL (ref 0.0–0.4)
Eos: 0 %
Hematocrit: 36.2 % — ABNORMAL LOW (ref 37.5–51.0)
Hemoglobin: 12.3 g/dL — ABNORMAL LOW (ref 13.0–17.7)
Immature Grans (Abs): 0 10*3/uL (ref 0.0–0.1)
Immature Granulocytes: 0 %
Lymphocytes Absolute: 0.8 10*3/uL (ref 0.7–3.1)
Lymphs: 6 %
MCH: 30.2 pg (ref 26.6–33.0)
MCHC: 34 g/dL (ref 31.5–35.7)
MCV: 89 fL (ref 79–97)
Monocytes Absolute: 1 10*3/uL — ABNORMAL HIGH (ref 0.1–0.9)
Monocytes: 7 %
Neutrophils Absolute: 11.3 10*3/uL — ABNORMAL HIGH (ref 1.4–7.0)
Neutrophils: 87 %
Platelets: 163 10*3/uL (ref 150–450)
RBC: 4.07 x10E6/uL — ABNORMAL LOW (ref 4.14–5.80)
RDW: 12.7 % (ref 11.6–15.4)
WBC: 13.1 10*3/uL — ABNORMAL HIGH (ref 3.4–10.8)

## 2024-04-24 MED ORDER — AMOXICILLIN-POT CLAVULANATE 875-125 MG PO TABS: 1.0000 | ORAL_TABLET | Freq: Two times a day (BID) | ORAL | 0 refills | Status: DC

## 2024-04-24 NOTE — Telephone Encounter (Signed)
 Ordered Augmentin  for 5 days for CAP coverage. Ordered chest x-ray.

## 2024-04-24 NOTE — Progress Notes (Signed)
 Internal Medicine Clinic Attending  Case discussed with the resident at the time of the visit.  We reviewed the resident's history and exam and pertinent patient test results.  I agree with the assessment, diagnosis, and plan of care documented in the resident's note.

## 2024-04-25 ENCOUNTER — Inpatient Hospital Stay (HOSPITAL_COMMUNITY)
Admission: EM | Admit: 2024-04-25 | Discharge: 2024-05-03 | DRG: 193 | Disposition: A | Discharge status: Home / Self Care | Attending: Internal Medicine | Admitting: Internal Medicine

## 2024-04-25 ENCOUNTER — Emergency Department (HOSPITAL_COMMUNITY)

## 2024-04-25 ENCOUNTER — Other Ambulatory Visit: Payer: Self-pay

## 2024-04-25 ENCOUNTER — Encounter (HOSPITAL_COMMUNITY): Payer: Self-pay | Admitting: Emergency Medicine

## 2024-04-25 DIAGNOSIS — G40909 Epilepsy, unspecified, not intractable, without status epilepticus: Secondary | ICD-10-CM | POA: Diagnosis present

## 2024-04-25 DIAGNOSIS — J440 Chronic obstructive pulmonary disease with acute lower respiratory infection: Secondary | ICD-10-CM | POA: Diagnosis present

## 2024-04-25 DIAGNOSIS — R531 Weakness: Principal | ICD-10-CM

## 2024-04-25 DIAGNOSIS — Z800 Family history of malignant neoplasm of digestive organs: Secondary | ICD-10-CM

## 2024-04-25 DIAGNOSIS — C3490 Malignant neoplasm of unspecified part of unspecified bronchus or lung: Secondary | ICD-10-CM | POA: Diagnosis present

## 2024-04-25 DIAGNOSIS — Z751 Person awaiting admission to adequate facility elsewhere: Secondary | ICD-10-CM

## 2024-04-25 DIAGNOSIS — J189 Pneumonia, unspecified organism: Principal | ICD-10-CM | POA: Diagnosis present

## 2024-04-25 DIAGNOSIS — I700 Atherosclerosis of aorta: Secondary | ICD-10-CM | POA: Diagnosis present

## 2024-04-25 DIAGNOSIS — Z66 Do not resuscitate: Secondary | ICD-10-CM | POA: Diagnosis present

## 2024-04-25 DIAGNOSIS — Z7901 Long term (current) use of anticoagulants: Secondary | ICD-10-CM

## 2024-04-25 DIAGNOSIS — Z9049 Acquired absence of other specified parts of digestive tract: Secondary | ICD-10-CM

## 2024-04-25 DIAGNOSIS — K219 Gastro-esophageal reflux disease without esophagitis: Secondary | ICD-10-CM | POA: Diagnosis present

## 2024-04-25 DIAGNOSIS — Z87891 Personal history of nicotine dependence: Secondary | ICD-10-CM

## 2024-04-25 DIAGNOSIS — I2510 Atherosclerotic heart disease of native coronary artery without angina pectoris: Secondary | ICD-10-CM

## 2024-04-25 DIAGNOSIS — I739 Peripheral vascular disease, unspecified: Secondary | ICD-10-CM | POA: Diagnosis present

## 2024-04-25 DIAGNOSIS — Z993 Dependence on wheelchair: Secondary | ICD-10-CM

## 2024-04-25 DIAGNOSIS — I69351 Hemiplegia and hemiparesis following cerebral infarction affecting right dominant side: Secondary | ICD-10-CM

## 2024-04-25 DIAGNOSIS — Z8673 Personal history of transient ischemic attack (TIA), and cerebral infarction without residual deficits: Secondary | ICD-10-CM

## 2024-04-25 DIAGNOSIS — Z841 Family history of disorders of kidney and ureter: Secondary | ICD-10-CM

## 2024-04-25 DIAGNOSIS — Z604 Social exclusion and rejection: Secondary | ICD-10-CM | POA: Diagnosis present

## 2024-04-25 DIAGNOSIS — Z7951 Long term (current) use of inhaled steroids: Secondary | ICD-10-CM

## 2024-04-25 DIAGNOSIS — Z9221 Personal history of antineoplastic chemotherapy: Secondary | ICD-10-CM

## 2024-04-25 DIAGNOSIS — Z1152 Encounter for screening for COVID-19: Secondary | ICD-10-CM

## 2024-04-25 DIAGNOSIS — I5032 Chronic diastolic (congestive) heart failure: Secondary | ICD-10-CM | POA: Diagnosis present

## 2024-04-25 DIAGNOSIS — E785 Hyperlipidemia, unspecified: Secondary | ICD-10-CM | POA: Diagnosis present

## 2024-04-25 DIAGNOSIS — W19XXXA Unspecified fall, initial encounter: Secondary | ICD-10-CM | POA: Diagnosis present

## 2024-04-25 DIAGNOSIS — Z79899 Other long term (current) drug therapy: Secondary | ICD-10-CM

## 2024-04-25 DIAGNOSIS — R471 Dysarthria and anarthria: Secondary | ICD-10-CM | POA: Diagnosis present

## 2024-04-25 DIAGNOSIS — J9601 Acute respiratory failure with hypoxia: Secondary | ICD-10-CM

## 2024-04-25 DIAGNOSIS — N179 Acute kidney failure, unspecified: Secondary | ICD-10-CM | POA: Diagnosis present

## 2024-04-25 DIAGNOSIS — I480 Paroxysmal atrial fibrillation: Secondary | ICD-10-CM

## 2024-04-25 DIAGNOSIS — I69320 Aphasia following cerebral infarction: Secondary | ICD-10-CM

## 2024-04-25 DIAGNOSIS — I959 Hypotension, unspecified: Secondary | ICD-10-CM | POA: Diagnosis present

## 2024-04-25 DIAGNOSIS — I110 Hypertensive heart disease with heart failure: Secondary | ICD-10-CM | POA: Diagnosis present

## 2024-04-25 DIAGNOSIS — I9589 Other hypotension: Secondary | ICD-10-CM | POA: Diagnosis present

## 2024-04-25 DIAGNOSIS — Z923 Personal history of irradiation: Secondary | ICD-10-CM

## 2024-04-25 DIAGNOSIS — E876 Hypokalemia: Secondary | ICD-10-CM | POA: Diagnosis present

## 2024-04-25 DIAGNOSIS — Z86711 Personal history of pulmonary embolism: Secondary | ICD-10-CM

## 2024-04-25 DIAGNOSIS — Z808 Family history of malignant neoplasm of other organs or systems: Secondary | ICD-10-CM

## 2024-04-25 LAB — I-STAT CHEM 8, ED
BUN: 22 mg/dL (ref 8–23)
Calcium, Ion: 1.07 mmol/L — ABNORMAL LOW (ref 1.15–1.40)
Chloride: 105 mmol/L (ref 98–111)
Creatinine, Ser: 1.9 mg/dL — ABNORMAL HIGH (ref 0.61–1.24)
Glucose, Bld: 184 mg/dL — ABNORMAL HIGH (ref 70–99)
HCT: 35 % — ABNORMAL LOW (ref 39.0–52.0)
Hemoglobin: 11.9 g/dL — ABNORMAL LOW (ref 13.0–17.0)
Potassium: 3.7 mmol/L (ref 3.5–5.1)
Sodium: 141 mmol/L (ref 135–145)
TCO2: 26 mmol/L (ref 22–32)

## 2024-04-25 LAB — CBC WITH DIFFERENTIAL/PLATELET
Abs Immature Granulocytes: 0.1 10*3/uL — ABNORMAL HIGH (ref 0.00–0.07)
Band Neutrophils: 17 %
Basophils Absolute: 0 10*3/uL (ref 0.0–0.1)
Basophils Relative: 0 %
Eosinophils Absolute: 0 10*3/uL (ref 0.0–0.5)
Eosinophils Relative: 0 %
HCT: 37.4 % — ABNORMAL LOW (ref 39.0–52.0)
Hemoglobin: 11.4 g/dL — ABNORMAL LOW (ref 13.0–17.0)
Lymphocytes Relative: 3 %
Lymphs Abs: 0.2 10*3/uL — ABNORMAL LOW (ref 0.7–4.0)
MCH: 29 pg (ref 26.0–34.0)
MCHC: 30.5 g/dL (ref 30.0–36.0)
MCV: 95.2 fL (ref 80.0–100.0)
Metamyelocytes Relative: 1 %
Monocytes Absolute: 0.8 10*3/uL (ref 0.1–1.0)
Monocytes Relative: 10 %
Neutro Abs: 6.5 10*3/uL (ref 1.7–7.7)
Neutrophils Relative %: 69 %
Platelets: 156 10*3/uL (ref 150–400)
RBC: 3.93 MIL/uL — ABNORMAL LOW (ref 4.22–5.81)
RDW: 13.4 % (ref 11.5–15.5)
WBC: 7.6 10*3/uL (ref 4.0–10.5)
nRBC: 0 % (ref 0.0–0.2)

## 2024-04-25 LAB — URINALYSIS, W/ REFLEX TO CULTURE (INFECTION SUSPECTED)
Bilirubin Urine: NEGATIVE
Glucose, UA: NEGATIVE mg/dL
Ketones, ur: NEGATIVE mg/dL
Leukocytes,Ua: NEGATIVE
Nitrite: NEGATIVE
Protein, ur: 100 mg/dL — AB
Specific Gravity, Urine: 1.033 — ABNORMAL HIGH (ref 1.005–1.030)
pH: 5 (ref 5.0–8.0)

## 2024-04-25 LAB — COMPREHENSIVE METABOLIC PANEL WITH GFR
ALT: 30 U/L (ref 0–44)
AST: 38 U/L (ref 15–41)
Albumin: 3.4 g/dL — ABNORMAL LOW (ref 3.5–5.0)
Alkaline Phosphatase: 73 U/L (ref 38–126)
Anion gap: 11 (ref 5–15)
BUN: 23 mg/dL (ref 8–23)
CO2: 26 mmol/L (ref 22–32)
Calcium: 9 mg/dL (ref 8.9–10.3)
Chloride: 104 mmol/L (ref 98–111)
Creatinine, Ser: 1.74 mg/dL — ABNORMAL HIGH (ref 0.61–1.24)
GFR, Estimated: 44 mL/min — ABNORMAL LOW (ref >60)
Glucose, Bld: 181 mg/dL — ABNORMAL HIGH (ref 70–99)
Potassium: 3.6 mmol/L (ref 3.5–5.1)
Sodium: 141 mmol/L (ref 135–145)
Total Bilirubin: 1.5 mg/dL — ABNORMAL HIGH (ref 0.0–1.2)
Total Protein: 7.6 g/dL (ref 6.5–8.1)

## 2024-04-25 LAB — RESP PANEL BY RT-PCR (RSV, FLU A&B, COVID)  RVPGX2
Influenza A by PCR: NEGATIVE
Influenza B by PCR: NEGATIVE
Resp Syncytial Virus by PCR: NEGATIVE
SARS Coronavirus 2 by RT PCR: NEGATIVE

## 2024-04-25 LAB — PROTIME-INR
INR: 1.8 — ABNORMAL HIGH (ref 0.8–1.2)
Prothrombin Time: 21.3 s — ABNORMAL HIGH (ref 11.4–15.2)

## 2024-04-25 LAB — I-STAT CG4 LACTIC ACID, ED: Lactic Acid, Venous: 1.7 mmol/L (ref 0.5–1.9)

## 2024-04-25 MED ORDER — IOHEXOL 300 MG/ML  SOLN
80.0000 mL | Freq: Once | INTRAMUSCULAR | Status: AC | PRN
Start: 2024-04-25 — End: 2024-04-25
  Administered 2024-04-25: 80 mL via INTRAVENOUS

## 2024-04-25 MED ORDER — SODIUM CHLORIDE 0.9 % IV BOLUS
1000.0000 mL | Freq: Once | INTRAVENOUS | Status: AC
  Administered 2024-04-25: 1000 mL via INTRAVENOUS

## 2024-04-25 NOTE — ED Triage Notes (Signed)
 Patient BIB EMS from home c/o fall. Per report caregiver holding patient and slid down and fell in the floor. No report of LOC or hitting his head in the floor. Hx of CVA.

## 2024-04-25 NOTE — ED Provider Notes (Signed)
 [place] EMERGENCY DEPARTMENT AT [facility] Provider Note   CSN: [id number] Arrival date & time: 04/25/24  1945     History  Chief Complaint  Patient presents with   Todd Estrada is a 63 y.o. male.  63 year old male brought in by EMS from home for cough and fever. History per EMS is limited. Call to patient's cousin for more information. Todd Estrada, went to [facility] for URI. Patient sick x 1 week, vomiting, nausea, headaches and body aches. Fever at the doctor's office 2 days ago, treated with OCT meds, called by PCP who was going to send for CXR.        Home Medications Prior to Admission medications   Medication Sig Start Date End Date Taking? Authorizing Provider  albuterol  (PROVENTIL ) (2.5 MG/3ML) 0.083% nebulizer solution Take 3 mLs (2.5 mg total) by nebulization every 4 (four) hours as needed for wheezing or shortness of breath. 08/16/22   Vita Grip, MD  amoxicillin -clavulanate (AUGMENTIN ) 875-125 MG tablet Take 1 tablet by mouth 2 (two) times daily for 5 days. 04/24/24 04/29/24  Jonelle Neri, DO  apixaban  (ELIQUIS ) 5 MG TABS tablet Take 1 tablet (5 mg total) by mouth 2 (two) times daily. 04/07/24   Dorthy Gavia, MD  atorvastatin  (LIPITOR) 40 MG tablet Take 1 tablet (40 mg total) by mouth daily. 02/06/24 02/05/25  Dorthy Gavia, MD  budesonide  (PULMICORT ) 0.5 MG/2ML nebulizer solution NEW PRESCRIPTION REQEUST: BUDESONIDE  0.5 MG/ 2ML- USE ONE VIAL TWICE DAILY Patient not taking: Reported on 08/19/2023 11/29/22   Wilfredo Hanly, MD  chlorpheniramine-HYDROcodone (TUSSIONEX) 10-8 MG/5ML Take 5 mLs by mouth every 12 (twelve) hours as needed for cough. 04/23/24   Jonelle Neri, DO  hydrocortisone  (ANUSOL -HC) 25 MG suppository Place 1 suppository (25 mg total) rectally 2 (two) times daily. For 7 days 12/10/23   Jerilynn Montenegro, MD  hyoscyamine  (LEVSIN/SL) 0.125 MG SL tablet Place 1 tablet (0.125 mg total) under the tongue every 4 (four) hours as needed.  01/12/24   Carin Charleston, MD  levETIRAcetam  (KEPPRA ) 500 MG tablet Take 1 tablet (500 mg total) by mouth 2 (two) times daily. 04/18/24   Johny Nap, NP  Multiple Vitamin (MULTIVITAMIN WITH MINERALS) TABS tablet Take 1 tablet by mouth daily.    [provider]  pantoprazole  (PROTONIX ) 40 MG tablet TAKE 1 TABLET(40 MG) BY MOUTH DAILY 03/04/24   Dorthy Gavia, MD  Tiotropium Bromide-Olodaterol (STIOLTO RESPIMAT ) 2.5-2.5 MCG/ACT AERS NEW PRESCRIPTION REQUEST: STIOLTO 2.5 MCG- INHALE TWO PUFFS BY MOUTH DAILY 02/07/24   Dorthy Gavia, MD      Allergies    Patient has no known allergies.    Review of Systems   Review of Systems Level 5 caveat, non verbal patient  Physical Exam Updated Vital Signs BP (!) 93/57   Pulse 77   Temp 98.2 F (36.8 C) (Oral)   Resp 20   SpO2 94%  Physical Exam Vitals and nursing note reviewed.  Constitutional:      General: He is not in acute distress.    Appearance: He is well-developed. He is diaphoretic.  HENT:     Head: Normocephalic and atraumatic.     Nose: Nose normal.     Mouth/Throat:     Mouth: Mucous membranes are dry.  Cardiovascular:     Rate and Rhythm: Regular rhythm. Tachycardia present.     Heart sounds: Normal heart sounds.  Pulmonary:     Effort: Pulmonary effort is normal.  Breath sounds: Normal breath sounds.  Abdominal:     Palpations: Abdomen is soft.     Tenderness: There is no abdominal tenderness.  Musculoskeletal:     Right lower leg: No edema.     Left lower leg: No edema.  Skin:    Comments: Hot to the touch  Neurological:     General: No focal deficit present.     Mental Status: He is alert.     Comments: Aphasia, right arm and leg chronically weak. Moves left arm/leg     ED Results / Procedures / Treatments   Labs (all labs ordered are listed, but only abnormal results are displayed) Labs Reviewed  COMPREHENSIVE METABOLIC PANEL WITH GFR - Abnormal; Notable for the following components:       Result Value   Glucose, Bld 181 (*)    Creatinine, Ser 1.74 (*)    Albumin  3.4 (*)    Total Bilirubin 1.5 (*)    GFR, Estimated 44 (*)    All other components within normal limits  CBC WITH DIFFERENTIAL/PLATELET - Abnormal; Notable for the following components:   RBC 3.93 (*)    Hemoglobin 11.4 (*)    HCT 37.4 (*)    Lymphs Abs 0.2 (*)    Abs Immature Granulocytes 0.10 (*)    All other components within normal limits  PROTIME-INR - Abnormal; Notable for the following components:   Prothrombin Time 21.3 (*)    INR 1.8 (*)    All other components within normal limits  I-STAT CHEM 8, ED - Abnormal; Notable for the following components:   Creatinine, Ser 1.90 (*)    Glucose, Bld 184 (*)    Calcium , Ion 1.07 (*)    Hemoglobin 11.9 (*)    HCT 35.0 (*)    All other components within normal limits  CULTURE, BLOOD (ROUTINE X 2)  RESP PANEL BY RT-PCR (RSV, FLU A&B, COVID)  RVPGX2  CULTURE, BLOOD (ROUTINE X 2)  URINALYSIS, W/ REFLEX TO CULTURE (INFECTION SUSPECTED)  I-STAT CG4 LACTIC ACID, ED    EKG None  Radiology DG Chest Port 1 View Result Date: 04/25/2024 CLINICAL DATA:  Fall. EXAM: PORTABLE CHEST 1 VIEW COMPARISON:  CT chest 03/01/2024 FINDINGS: Stable cardiomediastinal silhouette. Nodular scarring in both mid lungs. The scarring is about a fiducial marker in the right mid lung. Chronic bronchitic changes. Hyperinflation. No new focal consolidation, pleural effusion, or pneumothorax. IMPRESSION: 1. No acute cardiopulmonary disease. 2. Nodular scarring in both mid lungs. 3. Emphysema. Electronically Signed   By: Rozell Cornet M.D.   On: 04/25/2024 21:14    Procedures Procedures    Medications Ordered in ED Medications  sodium chloride  0.9 % bolus 1,000 mL (1,000 mLs Intravenous New Bag/Given 04/25/24 2129)  iohexol  (OMNIPAQUE ) 300 MG/ML solution 80 mL (80 mLs Intravenous Contrast Given 04/25/24 2157)    ED Course/ Medical Decision Making/ A&P                                  Medical Decision Making  This patient presents to the ED for concern of fever, cough, weakness, this involves an extensive number of treatment options, and is a complaint that carries with it a high risk of complications and morbidity.  The differential diagnosis includes but not limited to sepsis, PNA, UTI, viral illness, cholecystitis   Co morbidities that complicate the patient evaluation  HTN, CVA, asthma, COPD, GERD, seizures, PAD, lung  CA, PNA, sepsis   Additional history obtained:  Additional history obtained from cousin, EMS External records from outside source obtained and reviewed including prior labs and records on file   Lab Tests:  I Ordered, and personally interpreted labs.  The pertinent results include: I-STAT Chem-8 reviewed.  CBC with normal WBC.  COVID, flu, RSV negative.  CMP with mildly elevated glucose at 181.  Creatinine is elevated 1.7.  Bili elevated at 1.5.  INR is 1.8.  Lactic acid 1.7.   Imaging Studies ordered:  I ordered imaging studies including chest x-ray, CT/abdomen/pelvis I independently visualized and interpreted imaging which showed chest x-ray without acute findings.  CT scan pending at time of signout. I agree with the radiologist interpretation  Problem List / ED Course / Critical interventions / Medication management  63 year old male brought in by EMS from home where he lives with his cousin.  History is provided mostly by the cousin who states that they have both been sick with presumably a respiratory illness.  Went to PCP.  Review of records, patient was started on Augmentin .  He was COVID and flu negative per cousin.  Both have been generally weak and ill, today they were ambulating when they had a slow gradual fall to the ground.  Patient did not hit head or lose consciousness per cousin.  Patient is a difficult historian due to aphasia secondary to stroke.  He has right-sided deficits.  On arrival, he is tachypneic, tachycardic, possibly  diaphoretic versus wet from the rain.  Request rectal temperature.  Workup is unrevealing other than potential for AKI.  Care is signed out to oncoming provider pending CT imaging and urinalysis. I ordered medication including IV fluids I have reviewed the patients home medicines and have made adjustments as needed   Social Determinants of Health:  Lives with cousin   Test / Admission - Considered:  Disposition pending at time of signout to oncoming provider         Final Clinical Impression(s) / ED Diagnoses Final diagnoses:  Weakness    Rx / DC Orders ED Discharge Orders     None         Darlis Eisenmenger, PA-C 04/25/24 2215    Sallyanne Creamer, DO 05/01/24 1450

## 2024-04-26 DIAGNOSIS — Z7951 Long term (current) use of inhaled steroids: Secondary | ICD-10-CM | POA: Diagnosis not present

## 2024-04-26 DIAGNOSIS — E785 Hyperlipidemia, unspecified: Secondary | ICD-10-CM | POA: Diagnosis present

## 2024-04-26 DIAGNOSIS — R531 Weakness: Secondary | ICD-10-CM | POA: Diagnosis present

## 2024-04-26 DIAGNOSIS — Z87891 Personal history of nicotine dependence: Secondary | ICD-10-CM | POA: Diagnosis not present

## 2024-04-26 DIAGNOSIS — I5032 Chronic diastolic (congestive) heart failure: Secondary | ICD-10-CM | POA: Diagnosis present

## 2024-04-26 DIAGNOSIS — J189 Pneumonia, unspecified organism: Secondary | ICD-10-CM | POA: Diagnosis present

## 2024-04-26 DIAGNOSIS — K219 Gastro-esophageal reflux disease without esophagitis: Secondary | ICD-10-CM | POA: Diagnosis present

## 2024-04-26 DIAGNOSIS — G40909 Epilepsy, unspecified, not intractable, without status epilepticus: Secondary | ICD-10-CM | POA: Diagnosis present

## 2024-04-26 DIAGNOSIS — I2510 Atherosclerotic heart disease of native coronary artery without angina pectoris: Secondary | ICD-10-CM | POA: Diagnosis present

## 2024-04-26 DIAGNOSIS — E876 Hypokalemia: Secondary | ICD-10-CM | POA: Diagnosis present

## 2024-04-26 DIAGNOSIS — I739 Peripheral vascular disease, unspecified: Secondary | ICD-10-CM | POA: Diagnosis present

## 2024-04-26 DIAGNOSIS — J9601 Acute respiratory failure with hypoxia: Secondary | ICD-10-CM | POA: Diagnosis present

## 2024-04-26 DIAGNOSIS — I69351 Hemiplegia and hemiparesis following cerebral infarction affecting right dominant side: Secondary | ICD-10-CM | POA: Diagnosis not present

## 2024-04-26 DIAGNOSIS — Z7901 Long term (current) use of anticoagulants: Secondary | ICD-10-CM | POA: Diagnosis not present

## 2024-04-26 DIAGNOSIS — I480 Paroxysmal atrial fibrillation: Secondary | ICD-10-CM | POA: Diagnosis present

## 2024-04-26 DIAGNOSIS — Z66 Do not resuscitate: Secondary | ICD-10-CM | POA: Diagnosis present

## 2024-04-26 DIAGNOSIS — Z9221 Personal history of antineoplastic chemotherapy: Secondary | ICD-10-CM | POA: Diagnosis not present

## 2024-04-26 DIAGNOSIS — Z923 Personal history of irradiation: Secondary | ICD-10-CM | POA: Diagnosis not present

## 2024-04-26 DIAGNOSIS — Z1152 Encounter for screening for COVID-19: Secondary | ICD-10-CM | POA: Diagnosis not present

## 2024-04-26 DIAGNOSIS — C3490 Malignant neoplasm of unspecified part of unspecified bronchus or lung: Secondary | ICD-10-CM | POA: Diagnosis present

## 2024-04-26 DIAGNOSIS — J440 Chronic obstructive pulmonary disease with acute lower respiratory infection: Secondary | ICD-10-CM | POA: Diagnosis present

## 2024-04-26 DIAGNOSIS — I110 Hypertensive heart disease with heart failure: Secondary | ICD-10-CM | POA: Diagnosis present

## 2024-04-26 DIAGNOSIS — N179 Acute kidney failure, unspecified: Secondary | ICD-10-CM | POA: Diagnosis present

## 2024-04-26 DIAGNOSIS — I959 Hypotension, unspecified: Secondary | ICD-10-CM | POA: Diagnosis not present

## 2024-04-26 DIAGNOSIS — I69320 Aphasia following cerebral infarction: Secondary | ICD-10-CM | POA: Diagnosis not present

## 2024-04-26 DIAGNOSIS — Z79899 Other long term (current) drug therapy: Secondary | ICD-10-CM | POA: Diagnosis not present

## 2024-04-26 DIAGNOSIS — W19XXXA Unspecified fall, initial encounter: Secondary | ICD-10-CM | POA: Diagnosis present

## 2024-04-26 LAB — HIV ANTIBODY (ROUTINE TESTING W REFLEX): HIV Screen 4th Generation wRfx: NONREACTIVE

## 2024-04-26 MED ORDER — TRAMADOL HCL 50 MG PO TABS
50.0000 mg | ORAL_TABLET | Freq: Once | ORAL | Status: AC
  Administered 2024-04-26: 50 mg via ORAL
  Filled 2024-04-26: qty 1

## 2024-04-26 MED ORDER — KETOROLAC TROMETHAMINE 15 MG/ML IJ SOLN: 15.0000 mg | Freq: Once | INTRAMUSCULAR | Status: DC

## 2024-04-26 MED ORDER — SODIUM CHLORIDE 0.9 % IV SOLN
1.0000 g | Freq: Once | INTRAVENOUS | Status: AC
  Administered 2024-04-26: 1 g via INTRAVENOUS
  Filled 2024-04-26: qty 10

## 2024-04-26 MED ORDER — ATORVASTATIN CALCIUM 40 MG PO TABS
40.0000 mg | ORAL_TABLET | Freq: Every day | ORAL | Status: DC
  Administered 2024-04-27 – 2024-05-03 (×7): 40 mg via ORAL
  Filled 2024-04-26 (×7): qty 1

## 2024-04-26 MED ORDER — ARFORMOTEROL TARTRATE 15 MCG/2ML IN NEBU
15.0000 ug | INHALATION_SOLUTION | Freq: Two times a day (BID) | RESPIRATORY_TRACT | Status: DC
  Administered 2024-04-26 – 2024-05-03 (×14): 15 ug via RESPIRATORY_TRACT
  Filled 2024-04-26 (×14): qty 2

## 2024-04-26 MED ORDER — SODIUM CHLORIDE 0.9 % IV BOLUS
1000.0000 mL | Freq: Once | INTRAVENOUS | Status: AC
  Administered 2024-04-26: 1000 mL via INTRAVENOUS

## 2024-04-26 MED ORDER — ORAL CARE MOUTH RINSE: 15.0000 mL | OROMUCOSAL | Status: DC | PRN

## 2024-04-26 MED ORDER — PANTOPRAZOLE SODIUM 40 MG PO TBEC
40.0000 mg | DELAYED_RELEASE_TABLET | Freq: Every day | ORAL | Status: DC
  Administered 2024-04-27 – 2024-05-03 (×7): 40 mg via ORAL
  Filled 2024-04-26 (×7): qty 1

## 2024-04-26 MED ORDER — ACETAMINOPHEN 325 MG PO TABS
650.0000 mg | ORAL_TABLET | Freq: Four times a day (QID) | ORAL | Status: DC | PRN
  Administered 2024-04-26 – 2024-04-29 (×6): 650 mg via ORAL
  Filled 2024-04-26 (×6): qty 2

## 2024-04-26 MED ORDER — SODIUM CHLORIDE 0.9 % IV SOLN: Freq: Once | INTRAVENOUS | Status: AC

## 2024-04-26 MED ORDER — TRAZODONE HCL 50 MG PO TABS
25.0000 mg | ORAL_TABLET | Freq: Every evening | ORAL | Status: DC | PRN
  Administered 2024-04-27 – 2024-04-29 (×2): 25 mg via ORAL
  Filled 2024-04-26 (×2): qty 1

## 2024-04-26 MED ORDER — SODIUM CHLORIDE 0.9 % IV SOLN
500.0000 mg | Freq: Once | INTRAVENOUS | Status: AC
  Administered 2024-04-26: 500 mg via INTRAVENOUS
  Filled 2024-04-26: qty 5

## 2024-04-26 MED ORDER — UMECLIDINIUM BROMIDE 62.5 MCG/ACT IN AEPB
1.0000 | INHALATION_SPRAY | Freq: Every day | RESPIRATORY_TRACT | Status: DC
  Administered 2024-04-26 – 2024-05-03 (×7): 1 via RESPIRATORY_TRACT
  Filled 2024-04-26 (×2): qty 7

## 2024-04-26 MED ORDER — APIXABAN 5 MG PO TABS
5.0000 mg | ORAL_TABLET | Freq: Two times a day (BID) | ORAL | Status: DC
Start: 2024-04-26 — End: 2024-05-03
  Administered 2024-04-26 – 2024-05-03 (×14): 5 mg via ORAL
  Filled 2024-04-26 (×14): qty 1

## 2024-04-26 MED ORDER — ONDANSETRON HCL 4 MG/2ML IJ SOLN: 4.0000 mg | Freq: Four times a day (QID) | INTRAMUSCULAR | Status: DC | PRN

## 2024-04-26 MED ORDER — SODIUM CHLORIDE 0.9 % IV SOLN: INTRAVENOUS | Status: AC

## 2024-04-26 MED ORDER — ONDANSETRON HCL 4 MG PO TABS
4.0000 mg | ORAL_TABLET | Freq: Four times a day (QID) | ORAL | Status: DC | PRN
Start: 2024-04-26 — End: 2024-05-03

## 2024-04-26 MED ORDER — ACETAMINOPHEN 650 MG RE SUPP: 650.0000 mg | Freq: Four times a day (QID) | RECTAL | Status: DC | PRN

## 2024-04-26 MED ORDER — SODIUM CHLORIDE 0.9 % IV SOLN
1.0000 g | INTRAVENOUS | Status: AC
  Administered 2024-04-26 – 2024-04-29 (×4): 1 g via INTRAVENOUS
  Filled 2024-04-26 (×4): qty 10

## 2024-04-26 MED ORDER — SODIUM CHLORIDE 0.9 % IV SOLN
500.0000 mg | INTRAVENOUS | Status: AC
Start: 2024-04-26 — End: 2024-04-30
  Administered 2024-04-26 – 2024-04-29 (×4): 500 mg via INTRAVENOUS
  Filled 2024-04-26 (×5): qty 5

## 2024-04-26 MED ORDER — HYDROCOD POLI-CHLORPHE POLI ER 10-8 MG/5ML PO SUER
5.0000 mL | Freq: Two times a day (BID) | ORAL | Status: DC | PRN
  Administered 2024-04-26 – 2024-04-29 (×6): 5 mL via ORAL
  Filled 2024-04-26 (×6): qty 5

## 2024-04-26 MED ORDER — LEVETIRACETAM 500 MG PO TABS
500.0000 mg | ORAL_TABLET | Freq: Two times a day (BID) | ORAL | Status: DC
  Administered 2024-04-26 – 2024-05-03 (×14): 500 mg via ORAL
  Filled 2024-04-26 (×14): qty 1

## 2024-04-26 NOTE — Hospital Course (Signed)
 63 year old man history of COPD, GERD, hyperlipidemia, hypertension, lung cancer, seizure and CVA with residual expressive aphasia and right-sided deficiet presented emergency department with complaining of fever and cough.  At presentation to ED patient found hypotensive and tachycardic currently requiring 3 L oxygen O2 sat 97%. CBC unremarkable stable H&H. CMP showing elevated creatinine 1.74, low albumin  3.4 and elevated total bilirubin 1.5. Elevated pro time INR. Blood cultures are in process. Lactic acid within normal range.  Chest x-ray no acute disease process except emphysema.  CT chest abdomen pelvis showed multifocal pneumonia.  Hospitalist has been consulted management of sepsis secondary to pneumonia and AKI.  In the ED patient has been treated with 2 L of NS bolus, azithromycin  and ceftriaxone . Requested ED physician continue maintenance fluid, check respiratory panel and start heart healthy diet.

## 2024-04-26 NOTE — ED Provider Notes (Signed)
  Physical Exam  BP (!) 83/54   Pulse 70   Temp 98.2 F (36.8 C) (Oral)   Resp 20   SpO2 93%   Physical Exam  Procedures  Procedures  ED Course / MDM    Medical Decision Making Amount and/or Complexity of Data Reviewed Labs: ordered. Radiology: ordered.  Risk Prescription drug management. Decision regarding hospitalization.   Patient care assumed at shift handoff from previous provider, Editha Goring, PA-C.  Please see her note for full details.  In short, patient with past medical history significant for malnutrition, emphysema, squamous cell lung cancer, history of PE presents to the emergency department complaining of cough and fever.  Patient's cousin is caregiver states that the patient went to urgent care due to an upper respiratory infection, was prescribed Augmentin  for presumed lung infection.  He has had headache, body ache, nausea, vomiting for the past week.  Today he slid to the ground but had no reported injuries.  At time of shift handoff plan to follow-up on UA and CT chest abdomen pelvis with contrast.  CT shows New airspace disease in the left lower lobe greater than right lower  lobe compatible with multifocal pneumonia.    Chronic band like densities in the right upper lobe and left upper  lobe compatible with post treatment scarring.    Coronary artery disease, aortic atherosclerosis.    No acute findings in the abdomen or pelvis.    UA with small hemoglobin, many bacteria  I started the patient on Rocephin  and azithromycin  for multifocal pneumonia.  Lab work concerning for creatinine of 1.74 (1.023 weeks ago) In the setting of nausea and vomiting, with AKI and multifocal pneumonia, feel the patient would benefit from admission for continued IV fluids, antibiotics, repeat labs, observation.  I requested consultation with the hospitalist, Dr.Sundil, who agrees with plan for admission. Requests that I order NS at 150 mL/hr, heart healthy diet       Delories Fetter 04/26/24 0203    Sallyanne Creamer, DO 05/01/24 1450

## 2024-04-26 NOTE — H&P (Signed)
 History and Physical  AKSEL FRANCISCO MVH:[id number] DOB: 1961/08/12 DOA: 04/25/2024  PCP: Dorthy Gavia, MD   Chief Complaint: Cough, congestion  HPI: Todd Estrada is a 63 y.o. male with medical history significant for heart failure with preserved EF, prior CVA, PAD being admitted to the hospital with several days of cough and congestion found to have community-acquired pneumonia.  He was initially brought to the ER yesterday evening after sliding out of bed at home, no loss of consciousness no injury and no hitting head was reported.  Workup reveals evidence of multifocal pneumonia, hypoxia.  He had been started on Augmentin  for 5 days on 5/7.  Patient currently denies any pain, or significant shortness of breath, also specifically denies nausea vomiting, diarrhea or other concerns.  Review of Systems: Please see HPI for pertinent positives and negatives. A complete 10 system review of systems are otherwise negative.  Past Medical History:  Diagnosis Date   Asthma    Atypical chest pain 08/13/2022   Community acquired pneumonia 09/14/2022   COPD (chronic obstructive pulmonary disease) (HCC)    Essential hypertension 08/19/2021   GERD (gastroesophageal reflux disease)    HAP (hospital-acquired pneumonia) 09/16/2022   History of tracheostomy    03/09/22-04/11/22   HLD (hyperlipidemia)    Hypertension    Hypokalemia 08/13/2022   Hypomagnesemia 08/13/2022   Lung cancer (HCC)    PAD (peripheral artery disease) (HCC)    Seizures (HCC) 06/02/2022   Sepsis (HCC) 08/13/2022   Sepsis due to pneumonia (HCC) 04/13/2023   Stroke (HCC) 02/2022   Past Surgical History:  Procedure Laterality Date   BRONCHIAL BIOPSY  05/30/2022   Procedure: BRONCHIAL BIOPSIES;  Surgeon: Denson Flake, MD;  Location: [facility] ENDOSCOPY;  Service: Pulmonary;;   BRONCHIAL BRUSHINGS  05/30/2022   Procedure: BRONCHIAL BRUSHINGS;  Surgeon: Denson Flake, MD;  Location: [facility] ENDOSCOPY;  Service: Pulmonary;;   BRONCHIAL  NEEDLE ASPIRATION BIOPSY  05/30/2022   Procedure: BRONCHIAL NEEDLE ASPIRATION BIOPSIES;  Surgeon: Denson Flake, MD;  Location: [facility] ENDOSCOPY;  Service: Pulmonary;;   BRONCHIAL WASHINGS  05/30/2022   Procedure: BRONCHIAL WASHINGS;  Surgeon: Denson Flake, MD;  Location: [facility] ENDOSCOPY;  Service: Pulmonary;;   CHOLECYSTECTOMY N/A 07/05/2023   Procedure: LAPAROSCOPIC CHOLECYSTECTOMY WITH INTRAOPERATIVE CHOLANGIOGRAM;  Surgeon: Candyce Champagne, MD;  Location: [facility] ORS;  Service: General;  Laterality: N/A;   ERCP N/A 07/06/2023   Procedure: ENDOSCOPIC RETROGRADE CHOLANGIOPANCREATOGRAPHY (ERCP);  Surgeon: Ozell Blunt, MD;  Location: [facility] ENDOSCOPY;  Service: Gastroenterology;  Laterality: N/A;   ESOPHAGOGASTRODUODENOSCOPY (EGD) WITH PROPOFOL  N/A 03/11/2022   Procedure: ESOPHAGOGASTRODUODENOSCOPY (EGD) WITH PROPOFOL ;  Surgeon: Dorena Gander, MD;  Location: [facility] ENDOSCOPY;  Service: General;  Laterality: N/A;   FIDUCIAL MARKER PLACEMENT  05/30/2022   Procedure: FIDUCIAL MARKER PLACEMENT;  Surgeon: Denson Flake, MD;  Location: [facility] ENDOSCOPY;  Service: Pulmonary;;   IR ANGIO INTRA EXTRACRAN SEL COM CAROTID INNOMINATE UNI R MOD SED  03/02/2022   IR CT HEAD LTD  03/02/2022   IR PERCUTANEOUS ART THROMBECTOMY/INFUSION INTRACRANIAL INC DIAG ANGIO  03/02/2022   PEG PLACEMENT N/A 03/11/2022   Procedure: PERCUTANEOUS ENDOSCOPIC GASTROSTOMY (PEG) PLACEMENT;  Surgeon: Dorena Gander, MD;  Location: [facility] ENDOSCOPY;  Service: General;  Laterality: N/A;   RADIOLOGY WITH ANESTHESIA N/A 03/02/2022   Procedure: IR WITH ANESTHESIA;  Surgeon: Luellen Sages, MD;  Location: [facility] OR;  Service: Radiology;  Laterality: N/A;   REMOVAL OF STONES  07/06/2023   Procedure: REMOVAL OF STONES;  Surgeon: Ozell Blunt, MD;  Location: [facility] ENDOSCOPY;  Service: Gastroenterology;;   [address]  07/06/2023   Procedure: [address];  Surgeon: Ozell Blunt, MD;  Location: [facility] ENDOSCOPY;  Service: Gastroenterology;;   VIDEO BRONCHOSCOPY WITH RADIAL  ENDOBRONCHIAL ULTRASOUND  05/30/2022   Procedure: VIDEO BRONCHOSCOPY WITH RADIAL ENDOBRONCHIAL ULTRASOUND;  Surgeon: Denson Flake, MD;  Location: [facility] ENDOSCOPY;  Service: Pulmonary;;   Social History:  reports that he quit smoking about 1 years ago. His smoking use included cigarettes. He has never been exposed to tobacco smoke. He has never used smokeless tobacco. He reports that he does not currently use alcohol. He reports that he does not use drugs.  No Known Allergies  Family History  Problem Relation Age of Onset   Throat cancer Mother    Liver cancer Father    Kidney failure Sister    Cancer - Lung Paternal Uncle      Prior to Admission medications   Medication Sig Start Date End Date Taking? Authorizing Provider  albuterol  (PROVENTIL ) (2.5 MG/3ML) 0.083% nebulizer solution Take 3 mLs (2.5 mg total) by nebulization every 4 (four) hours as needed for wheezing or shortness of breath. 08/16/22   Vita Grip, MD  amoxicillin -clavulanate (AUGMENTIN ) 875-125 MG tablet Take 1 tablet by mouth 2 (two) times daily for 5 days. 04/24/24 04/29/24  Jonelle Neri, DO  apixaban  (ELIQUIS ) 5 MG TABS tablet Take 1 tablet (5 mg total) by mouth 2 (two) times daily. 04/07/24   Dorthy Gavia, MD  atorvastatin  (LIPITOR) 40 MG tablet Take 1 tablet (40 mg total) by mouth daily. 02/06/24 02/05/25  Dorthy Gavia, MD  budesonide  (PULMICORT ) 0.5 MG/2ML nebulizer solution NEW PRESCRIPTION REQEUST: BUDESONIDE  0.5 MG/ 2ML- USE ONE VIAL TWICE DAILY Patient not taking: Reported on 08/19/2023 11/29/22   Wilfredo Hanly, MD  chlorpheniramine-HYDROcodone (TUSSIONEX) 10-8 MG/5ML Take 5 mLs by mouth every 12 (twelve) hours as needed for cough. 04/23/24   Jonelle Neri, DO  hydrocortisone  (ANUSOL -HC) 25 MG suppository Place 1 suppository (25 mg total) rectally 2 (two) times daily. For 7 days 12/10/23   Jerilynn Montenegro, MD  hyoscyamine  (LEVSIN/SL) 0.125 MG SL tablet Place 1 tablet (0.125 mg total) under the tongue every 4  (four) hours as needed. 01/12/24   Carin Charleston, MD  levETIRAcetam  (KEPPRA ) 500 MG tablet Take 1 tablet (500 mg total) by mouth 2 (two) times daily. 04/18/24   Johny Nap, NP  Multiple Vitamin (MULTIVITAMIN WITH MINERALS) TABS tablet Take 1 tablet by mouth daily.    [provider]  pantoprazole  (PROTONIX ) 40 MG tablet TAKE 1 TABLET(40 MG) BY MOUTH DAILY 03/04/24   Dorthy Gavia, MD  Tiotropium Bromide-Olodaterol (STIOLTO RESPIMAT ) 2.5-2.5 MCG/ACT AERS NEW PRESCRIPTION REQUEST: STIOLTO 2.5 MCG- INHALE TWO PUFFS BY MOUTH DAILY 02/07/24   Dorthy Gavia, MD    Physical Exam: BP 135/72   Pulse 95   Temp (!) 97.5 F (36.4 C)   Resp 20   SpO2 99%  General:  Alert, oriented, calm, in no acute distress, wearing 3 L nasal cannula oxygen, looks nontoxic. Cardiovascular: RRR, no murmurs or rubs, no peripheral edema  Respiratory: clear to auscultation bilaterally, no wheezes, no crackles  Abdomen: soft, nontender, nondistended, normal bowel tones heard  Skin: dry, no rashes  Musculoskeletal: no joint effusions, normal range of motion  Psychiatric: appropriate affect, normal speech  Neurologic: extraocular muscles intact, clear speech, moving all extremities with intact sensorium         Labs on Admission:  Basic Metabolic Panel: Recent Labs  Lab 04/25/24  2042 04/25/24 2050  NA 141 141  K 3.6 3.7  CL 104 105  CO2 26  --   GLUCOSE 181* 184*  BUN 23 22  CREATININE 1.74* 1.90*  CALCIUM  9.0  --    Liver Function Tests: Recent Labs  Lab 04/25/24 2042  AST 38  ALT 30  ALKPHOS 73  BILITOT 1.5*  PROT 7.6  ALBUMIN  3.4*   No results for input(s): "LIPASE", "AMYLASE" in the last 168 hours. No results for input(s): "AMMONIA" in the last 168 hours. CBC: Recent Labs  Lab 04/23/24 1543 04/25/24 2042 04/25/24 2050  WBC 13.1* 7.6  --   NEUTROABS 11.3* 6.5  --   HGB 12.3* 11.4* 11.9*  HCT 36.2* 37.4* 35.0*  MCV 89 95.2  --   PLT 163 156  --    Cardiac Enzymes: No  results for input(s): "CKTOTAL", "CKMB", "CKMBINDEX", "TROPONINI" in the last 168 hours. BNP (last 3 results) Recent Labs    05/16/23 2013 08/19/23 2149  BNP 28.8 68.1    ProBNP (last 3 results) No results for input(s): "PROBNP" in the last 8760 hours.  CBG: No results for input(s): "GLUCAP" in the last 168 hours.  Radiological Exams on Admission: CT CHEST ABDOMEN PELVIS W CONTRAST Result Date: 04/25/2024 CLINICAL DATA:  Fever, cough EXAM: CT CHEST, ABDOMEN, AND PELVIS WITH CONTRAST TECHNIQUE: Multidetector CT imaging of the chest, abdomen and pelvis was performed following the standard protocol during bolus administration of intravenous contrast. RADIATION DOSE REDUCTION: This exam was performed according to the departmental dose-optimization program which includes automated exposure control, adjustment of the mA and/or kV according to patient size and/or use of iterative reconstruction technique. CONTRAST:  80mL OMNIPAQUE  IOHEXOL  300 MG/ML  SOLN COMPARISON:  Abdominal CT 04/01/2024.  Chest CT 03/01/2024 FINDINGS: CT CHEST FINDINGS Cardiovascular: Heart is normal size. Aorta is normal caliber. Scattered coronary artery and aortic atherosclerosis. Mediastinum/Nodes: No mediastinal, hilar, or axillary adenopathy. Trachea and esophagus are unremarkable. Thyroid unremarkable. Lungs/Pleura: Severe centrilobular emphysema. Stable bandlike density peripherally in the right upper lobe with central fiducial marker, similar to prior study. Stable bandlike scarring in the anterior left upper lobe. New consolidation in the left lower lobe and to a lesser extent right lower lobe most compatible with pneumonia. No effusions. Musculoskeletal: Chest wall soft tissues are unremarkable. No acute bony abnormality. Again noted is the right lateral 5th rib fracture, unchanged since prior study. CT ABDOMEN PELVIS FINDINGS Hepatobiliary: No focal liver abnormality is seen. Status post cholecystectomy. No biliary  dilatation. Pancreas: No focal abnormality or ductal dilatation. Spleen: No focal abnormality.  Normal size. Adrenals/Urinary Tract: No adrenal abnormality. No focal renal abnormality. No stones or hydronephrosis. Urinary bladder is unremarkable. Stomach/Bowel: Stomach, large and small bowel grossly unremarkable. Vascular/Lymphatic: Aortic atherosclerosis. No evidence of aneurysm or adenopathy. Reproductive: No visible focal abnormality. Other: No free fluid or free air. Musculoskeletal: No acute bony abnormality. IMPRESSION: New airspace disease in the left lower lobe greater than right lower lobe compatible with multifocal pneumonia. Chronic band like densities in the right upper lobe and left upper lobe compatible with post treatment scarring. Coronary artery disease, aortic atherosclerosis. No acute findings in the abdomen or pelvis. Electronically Signed   By: Janeece Mechanic M.D.   On: 04/25/2024 22:20   DG Chest Port 1 View Result Date: 04/25/2024 CLINICAL DATA:  Fall. EXAM: PORTABLE CHEST 1 VIEW COMPARISON:  CT chest 03/01/2024 FINDINGS: Stable cardiomediastinal silhouette. Nodular scarring in both mid lungs. The scarring is about a fiducial marker  in the right mid lung. Chronic bronchitic changes. Hyperinflation. No new focal consolidation, pleural effusion, or pneumothorax. IMPRESSION: 1. No acute cardiopulmonary disease. 2. Nodular scarring in both mid lungs. 3. Emphysema. Electronically Signed   By: Rozell Cornet M.D.   On: 04/25/2024 21:14   Assessment/Plan Todd Estrada is a 63 y.o. male with medical history significant for heart failure with preserved EF, prior CVA, PAD being admitted to the hospital with several days of cough and congestion found to have community-acquired pneumonia.  Community-acquired pneumonia-currently not meeting SIRS criteria.  With acute hypoxic respiratory failure, requiring 3 L nasal cannula oxygen.  Lung sounds are clear, without evidence of acute COPD  exacerbation. -Inpatient admission -Continue supplemental oxygen -Continue IV azithromycin  and IV Rocephin   Acute kidney injury-on baseline normal renal function, presumably due to reduced oral intake since he has been coughing and congested for the last few days. -Avoid nephrotoxins -Hydrate gently with normal saline -Follow renal function with daily labs  History of CVA-continue Keppra  and Eliquis   Hyperlipidemia-Lipitor  COPD-on room air at baseline, currently no obvious evidence of acute exacerbation of COPD. -Continue home inhalers  GERD-Protonix   DVT prophylaxis: Eliquis     Code Status: Do not attempt resuscitation (DNR) PRE-ARREST INTERVENTIONS DESIRED  Consults called: None  Admission status: The appropriate patient status for this patient is INPATIENT. Inpatient status is judged to be reasonable and necessary in order to provide the required intensity of service to ensure the patient's safety. The patient's presenting symptoms, physical exam findings, and initial radiographic and laboratory data in the context of their chronic comorbidities is felt to place them at high risk for further clinical deterioration. Furthermore, it is not anticipated that the patient will be medically stable for discharge from the hospital within 2 midnights of admission.    I certify that at the point of admission it is my clinical judgment that the patient will require inpatient hospital care spanning beyond 2 midnights from the point of admission due to high intensity of service, high risk for further deterioration and high frequency of surveillance required  Time spent: 59 minutes  Lashunta Frieden Rickey Charm MD [facility] Pager [phone]  If 7PM-7AM, please contact night-coverage [email or web address] Password [facility]  04/26/2024, 7:49 AM

## 2024-04-26 NOTE — Plan of Care (Signed)

## 2024-04-26 NOTE — Progress Notes (Signed)
 Patient admitted for CAP, no acute distress noted this shift, denies any pain, shortness of breath, nausea vomiting, diarrhea , in bed resting, call light in reach

## 2024-04-26 NOTE — Progress Notes (Signed)
 Scheduled respiratory medications have not been verified as of 1330- Pharmacy aware and is waiting on more information.

## 2024-04-27 DIAGNOSIS — J189 Pneumonia, unspecified organism: Secondary | ICD-10-CM

## 2024-04-27 LAB — CBC
HCT: 25.6 % — ABNORMAL LOW (ref 39.0–52.0)
Hemoglobin: 7.7 g/dL — ABNORMAL LOW (ref 13.0–17.0)
MCH: 30 pg (ref 26.0–34.0)
MCHC: 30.1 g/dL (ref 30.0–36.0)
MCV: 99.6 fL (ref 80.0–100.0)
Platelets: 158 10*3/uL (ref 150–400)
RBC: 2.57 MIL/uL — ABNORMAL LOW (ref 4.22–5.81)
RDW: 13.7 % (ref 11.5–15.5)
WBC: 9 10*3/uL (ref 4.0–10.5)
nRBC: 0 % (ref 0.0–0.2)

## 2024-04-27 LAB — BASIC METABOLIC PANEL WITH GFR
Anion gap: 10 (ref 5–15)
BUN: 10 mg/dL (ref 8–23)
CO2: 25 mmol/L (ref 22–32)
Calcium: 8.3 mg/dL — ABNORMAL LOW (ref 8.9–10.3)
Chloride: 103 mmol/L (ref 98–111)
Creatinine, Ser: 0.85 mg/dL (ref 0.61–1.24)
GFR, Estimated: >60 mL/min (ref >60)
Glucose, Bld: 140 mg/dL — ABNORMAL HIGH (ref 70–99)
Potassium: 3.3 mmol/L — ABNORMAL LOW (ref 3.5–5.1)
Sodium: 138 mmol/L (ref 135–145)

## 2024-04-27 LAB — HEMOGLOBIN AND HEMATOCRIT, BLOOD
HCT: 30.8 % — ABNORMAL LOW (ref 39.0–52.0)
Hemoglobin: 9.6 g/dL — ABNORMAL LOW (ref 13.0–17.0)

## 2024-04-27 MED ORDER — SODIUM CHLORIDE 0.9 % IV SOLN: INTRAVENOUS | Status: AC

## 2024-04-27 MED ORDER — DM-GUAIFENESIN ER 30-600 MG PO TB12
2.0000 | ORAL_TABLET | Freq: Two times a day (BID) | ORAL | Status: DC
  Administered 2024-04-27 – 2024-05-03 (×13): 2 via ORAL
  Filled 2024-04-27 (×8): qty 2
  Filled 2024-04-27: qty 1
  Filled 2024-04-27 (×4): qty 2

## 2024-04-27 MED ORDER — ENSURE ENLIVE PO LIQD
237.0000 mL | Freq: Two times a day (BID) | ORAL | Status: DC
  Administered 2024-04-29 – 2024-05-03 (×9): 237 mL via ORAL

## 2024-04-27 MED ORDER — TRAMADOL HCL 50 MG PO TABS
50.0000 mg | ORAL_TABLET | Freq: Once | ORAL | Status: AC
  Administered 2024-04-27: 50 mg via ORAL
  Filled 2024-04-27: qty 1

## 2024-04-27 NOTE — Plan of Care (Signed)

## 2024-04-27 NOTE — Plan of Care (Signed)

## 2024-04-27 NOTE — Progress Notes (Signed)
 PROGRESS NOTE    Todd Estrada  WUJ:[id number] DOB: September 26, 1961 DOA: 04/25/2024 PCP: Dorthy Gavia, MD   Brief Narrative: This 63 yrs old male with medical history significant for heart failure with preserved EF, prior CVA, PAD being admitted to the hospital with several days of cough and congestion and  found to have community-acquired pneumonia.  He was initially brought to the ER yesterday evening after sliding out of bed at home, no loss of consciousness,  no injury and no head injury was reported.  Workup reveals evidence of multifocal pneumonia, hypoxia.  He had been started on Augmentin  for 5 days on 5/7.  Patient was admitted for further evaluation and started on antibiotics for community-acquired pneumonia.  Assessment & Plan:   Principal Problem:   CAP (community acquired pneumonia)  Acute hypoxic respiratory failure: Community-acquired pneumonia: Patient does not meet criteria for SIRS.   He was hypoxic requiring 3 L of supplemental oxygen on arrival. Continue supplemental oxygen and wean as tolerated. CT chest abdomen and pelvis shows multifocal pneumonia Continue empiric antibiotics ( ceftriaxone  and Zithromax  ). Lung sounds clear without any evidence of COPD exacerbation.   Acute kidney injury: Likely prerenal due to decreased intake last few days. Avoid nephrotoxic medications. Renal function improved with IV hydration.   History of CVA: continue Keppra  and Eliquis .   Hyperlipidemia: Continue Lipitor   COPD: He does not use oxygen at baseline,  currently no obvious evidence of acute exacerbation of COPD. Continue home inhalers.   GERD: Continue Protonix .   Hypokalemia: Replaced.  Continue to monitor.  Acute blood loss anemia: Hemoglobin dropped from 11.9>7.7 Obtain stool for occult blood, repeat H&H.    DVT prophylaxis: Eliquis  Code Status: Full code Family Communication: DNR Disposition Plan:    Status is: Inpatient Remains inpatient appropriate  because: Severity of illness.    Consultants:  None  Procedures: None  Antimicrobials: Anti-infectives (From admission, onward)    Start     Dose/Rate Route Frequency Ordered Stop   04/26/24 2200  azithromycin  (ZITHROMAX ) 500 mg in sodium chloride  0.9 % 250 mL IVPB        500 mg 250 mL/hr over 60 Minutes Intravenous Every 24 hours 04/26/24 0749 04/30/24 2159   04/26/24 2200  cefTRIAXone  (ROCEPHIN ) 1 g in sodium chloride  0.9 % 100 mL IVPB        1 g 200 mL/hr over 30 Minutes Intravenous Every 24 hours 04/26/24 0749 04/30/24 2159   04/26/24 0030  cefTRIAXone  (ROCEPHIN ) 1 g in sodium chloride  0.9 % 100 mL IVPB        1 g 200 mL/hr over 30 Minutes Intravenous  Once 04/26/24 0028 04/26/24 0153   04/26/24 0030  azithromycin  (ZITHROMAX ) 500 mg in sodium chloride  0.9 % 250 mL IVPB        500 mg 250 mL/hr over 60 Minutes Intravenous  Once 04/26/24 0028 04/26/24 0153      Subjective: Patient was seen and examined at bedside.  Overnight events noted. Patient reports feeling better.  He denies any bleeding.  He spiked fever in the morning.  Objective: Vitals:   04/27/24 0336 04/27/24 0811 04/27/24 0929 04/27/24 1209  BP: 122/60 (!) 155/71  (!) 104/57  Pulse: 89 99  92  Resp: 16 16  18   Temp: 98.7 F (37.1 C) 99.4 F (37.4 C)  (!) 100.4 F (38 C)  TempSrc: Oral Oral  Oral  SpO2: 98% 94% 96% 94%  Weight:      Height:  Intake/Output Summary (Last 24 hours) at 04/27/2024 1432 Last data filed at 04/27/2024 1209 Gross per 24 hour  Intake 3610.76 ml  Output 2450 ml  Net 1160.76 ml   Filed Weights   04/26/24 1008  Weight: 90.9 kg    Examination:  General exam: Appears calm and comfortable, deconditioned, not in any acute distress. Respiratory system: CTA Bilaterally. Respiratory effort normal.  RR 16 Cardiovascular system: S1 & S2 heard, RRR. No JVD, murmurs, rubs, gallops or clicks.  Gastrointestinal system: Abdomen is non distended, soft and non tender.  Normal bowel  sounds heard. Central nervous system: Alert and oriented x 3. No focal neurological deficits. Extremities: No edema, no cyanosis, no clubbing Skin: No rashes, lesions or ulcers Psychiatry: Judgement and insight appear normal. Mood & affect appropriate.     Data Reviewed: I have personally reviewed following labs and imaging studies  CBC: Recent Labs  Lab 04/23/24 1543 04/25/24 2042 04/25/24 2050 04/27/24 0904  WBC 13.1* 7.6  --  9.0  NEUTROABS 11.3* 6.5  --   --   HGB 12.3* 11.4* 11.9* 7.7*  HCT 36.2* 37.4* 35.0* 25.6*  MCV 89 95.2  --  99.6  PLT 163 156  --  158   Basic Metabolic Panel: Recent Labs  Lab 04/25/24 2042 04/25/24 2050 04/27/24 0904  NA 141 141 138  K 3.6 3.7 3.3*  CL 104 105 103  CO2 26  --  25  GLUCOSE 181* 184* 140*  BUN 23 22 10   CREATININE 1.74* 1.90* 0.85  CALCIUM  9.0  --  8.3*   GFR: Estimated Creatinine Clearance: 97.6 mL/min (by C-G formula based on SCr of 0.85 mg/dL). Liver Function Tests: Recent Labs  Lab 04/25/24 2042  AST 38  ALT 30  ALKPHOS 73  BILITOT 1.5*  PROT 7.6  ALBUMIN  3.4*   No results for input(s): "LIPASE", "AMYLASE" in the last 168 hours. No results for input(s): "AMMONIA" in the last 168 hours. Coagulation Profile: Recent Labs  Lab 04/25/24 2042  INR 1.8*   Cardiac Enzymes: No results for input(s): "CKTOTAL", "CKMB", "CKMBINDEX", "TROPONINI" in the last 168 hours. BNP (last 3 results) No results for input(s): "PROBNP" in the last 8760 hours. HbA1C: No results for input(s): "HGBA1C" in the last 72 hours. CBG: No results for input(s): "GLUCAP" in the last 168 hours. Lipid Profile: No results for input(s): "CHOL", "HDL", "LDLCALC", "TRIG", "CHOLHDL", "LDLDIRECT" in the last 72 hours. Thyroid Function Tests: No results for input(s): "TSH", "T4TOTAL", "FREET4", "T3FREE", "THYROIDAB" in the last 72 hours. Anemia Panel: No results for input(s): "VITAMINB12", "FOLATE", "FERRITIN", "TIBC", "IRON", "RETICCTPCT" in  the last 72 hours. Sepsis Labs: Recent Labs  Lab 04/25/24 2049  LATICACIDVEN 1.7    Recent Results (from the past 240 hours)  Resp panel by RT-PCR (RSV, Flu A&B, Covid) Anterior Nasal Swab     Status: None   Collection Time: 04/23/24  3:14 PM   Specimen: Anterior Nasal Swab  Result Value Ref Range Status   SARS Coronavirus 2 by RT PCR NEGATIVE NEGATIVE Final   Influenza A by PCR NEGATIVE NEGATIVE Final   Influenza B by PCR NEGATIVE NEGATIVE Final    Comment: (NOTE) The Xpert Xpress SARS-CoV-2/FLU/RSV plus assay is intended as an aid in the diagnosis of influenza from Nasopharyngeal swab specimens and should not be used as a sole basis for treatment. Nasal washings and aspirates are unacceptable for Xpert Xpress SARS-CoV-2/FLU/RSV testing.  Fact Sheet for Patients: BloggerCourse.com  Fact Sheet for Healthcare Providers: SeriousBroker.it  This test is not yet approved or cleared by the [place]  FDA and has been authorized for detection and/or diagnosis of SARS-CoV-2 by FDA under an Emergency Use Authorization (EUA). This EUA will remain in effect (meaning this test can be used) for the duration of the COVID-19 declaration under Section 564(b)(1) of the Act, 21 U.S.C. section 360bbb-3(b)(1), unless the authorization is terminated or revoked.     Resp Syncytial Virus by PCR NEGATIVE NEGATIVE Final    Comment: (NOTE) Fact Sheet for Patients: BloggerCourse.com  Fact Sheet for Healthcare Providers: SeriousBroker.it  This test is not yet approved or cleared by the [place]  FDA and has been authorized for detection and/or diagnosis of SARS-CoV-2 by FDA under an Emergency Use Authorization (EUA). This EUA will remain in effect (meaning this test can be used) for the duration of the COVID-19 declaration under Section 564(b)(1) of the Act, 21 U.S.C. section  360bbb-3(b)(1), unless the authorization is terminated or revoked.  Performed at [facility], [address], [place], [place] [address]   Resp panel by RT-PCR (RSV, Flu A&B, Covid) Anterior Nasal Swab     Status: None   Collection Time: 04/25/24  8:33 PM   Specimen: Anterior Nasal Swab  Result Value Ref Range Status   SARS Coronavirus 2 by RT PCR NEGATIVE NEGATIVE Final    Comment: (NOTE) SARS-CoV-2 target nucleic acids are NOT DETECTED.  The SARS-CoV-2 RNA is generally detectable in upper respiratory specimens during the acute phase of infection. The lowest concentration of SARS-CoV-2 viral copies this assay can detect is 138 copies/mL. A negative result does not preclude SARS-Cov-2 infection and should not be used as the sole basis for treatment or other patient management decisions. A negative result may occur with  improper specimen collection/handling, submission of specimen other than nasopharyngeal swab, presence of viral mutation(s) within the areas targeted by this assay, and inadequate number of viral copies(<138 copies/mL). A negative result must be combined with clinical observations, patient history, and epidemiological information. The expected result is Negative.  Fact Sheet for Patients:  BloggerCourse.com  Fact Sheet for Healthcare Providers:  SeriousBroker.it  This test is no t yet approved or cleared by the [place]  FDA and  has been authorized for detection and/or diagnosis of SARS-CoV-2 by FDA under an Emergency Use Authorization (EUA). This EUA will remain  in effect (meaning this test can be used) for the duration of the COVID-19 declaration under Section 564(b)(1) of the Act, 21 U.S.C.section 360bbb-3(b)(1), unless the authorization is terminated  or revoked sooner.       Influenza A by PCR NEGATIVE NEGATIVE Final   Influenza B by PCR NEGATIVE NEGATIVE Final    Comment: (NOTE) The Xpert  Xpress SARS-CoV-2/FLU/RSV plus assay is intended as an aid in the diagnosis of influenza from Nasopharyngeal swab specimens and should not be used as a sole basis for treatment. Nasal washings and aspirates are unacceptable for Xpert Xpress SARS-CoV-2/FLU/RSV testing.  Fact Sheet for Patients: BloggerCourse.com  Fact Sheet for Healthcare Providers: SeriousBroker.it  This test is not yet approved or cleared by the [place]  FDA and has been authorized for detection and/or diagnosis of SARS-CoV-2 by FDA under an Emergency Use Authorization (EUA). This EUA will remain in effect (meaning this test can be used) for the duration of the COVID-19 declaration under Section 564(b)(1) of the Act, 21 U.S.C. section 360bbb-3(b)(1), unless the authorization is terminated or revoked.     Resp Syncytial Virus by PCR NEGATIVE NEGATIVE Final  Comment: (NOTE) Fact Sheet for Patients: BloggerCourse.com  Fact Sheet for Healthcare Providers: SeriousBroker.it  This test is not yet approved or cleared by the [place]  FDA and has been authorized for detection and/or diagnosis of SARS-CoV-2 by FDA under an Emergency Use Authorization (EUA). This EUA will remain in effect (meaning this test can be used) for the duration of the COVID-19 declaration under Section 564(b)(1) of the Act, 21 U.S.C. section 360bbb-3(b)(1), unless the authorization is terminated or revoked.  Performed at [facility], [address], [place], [place] [address]   Blood Culture (routine x 2)     Status: None (Preliminary result)   Collection Time: 04/25/24  8:42 PM   Specimen: BLOOD  Result Value Ref Range Status   Specimen Description   Final    BLOOD LEFT ANTECUBITAL Performed at [facility], [address], [place], [place] [address]    Special Requests   Final    BOTTLES  DRAWN AEROBIC AND ANAEROBIC Blood Culture adequate volume Performed at [facility], [address], [place], [place] [address]    Culture   Final    NO GROWTH 2 DAYS Performed at [facility], [address], [place], [place] [address]    Report Status PENDING  Incomplete  Blood Culture (routine x 2)     Status: None (Preliminary result)   Collection Time: 04/25/24  8:42 PM   Specimen: BLOOD LEFT FOREARM  Result Value Ref Range Status   Specimen Description   Final    BLOOD LEFT FOREARM Performed at [facility], [address] [place], [place] [address]    Special Requests   Final    BOTTLES DRAWN AEROBIC AND ANAEROBIC Blood Culture adequate volume Performed at [facility], [address], [place], [place] [address]    Culture   Final    NO GROWTH 2 DAYS Performed at [facility], [address], [place], [place] [address]    Report Status PENDING  Incomplete    Radiology Studies: CT CHEST ABDOMEN PELVIS W CONTRAST Result Date: 04/25/2024 CLINICAL DATA:  Fever, cough EXAM: CT CHEST, ABDOMEN, AND PELVIS WITH CONTRAST TECHNIQUE: Multidetector CT imaging of the chest, abdomen and pelvis was performed following the standard protocol during bolus administration of intravenous contrast. RADIATION DOSE REDUCTION: This exam was performed according to the departmental dose-optimization program which includes automated exposure control, adjustment of the mA and/or kV according to patient size and/or use of iterative reconstruction technique. CONTRAST:  80mL OMNIPAQUE  IOHEXOL  300 MG/ML  SOLN COMPARISON:  Abdominal CT 04/01/2024.  Chest CT 03/01/2024 FINDINGS: CT CHEST FINDINGS Cardiovascular: Heart is normal size. Aorta is normal caliber. Scattered coronary artery and aortic atherosclerosis. Mediastinum/Nodes: No mediastinal, hilar, or axillary adenopathy. Trachea and esophagus are unremarkable. Thyroid unremarkable. Lungs/Pleura: Severe  centrilobular emphysema. Stable bandlike density peripherally in the right upper lobe with central fiducial marker, similar to prior study. Stable bandlike scarring in the anterior left upper lobe. New consolidation in the left lower lobe and to a lesser extent right lower lobe most compatible with pneumonia. No effusions. Musculoskeletal: Chest wall soft tissues are unremarkable. No acute bony abnormality. Again noted is the right lateral 5th rib fracture, unchanged since prior study. CT ABDOMEN PELVIS FINDINGS Hepatobiliary: No focal liver abnormality is seen. Status post cholecystectomy. No biliary dilatation. Pancreas: No focal abnormality or ductal dilatation. Spleen: No focal abnormality.  Normal size. Adrenals/Urinary Tract: No adrenal abnormality. No focal  renal abnormality. No stones or hydronephrosis. Urinary bladder is unremarkable. Stomach/Bowel: Stomach, large and small bowel grossly unremarkable. Vascular/Lymphatic: Aortic atherosclerosis. No evidence of aneurysm or adenopathy. Reproductive: No visible focal abnormality. Other: No free fluid or free air. Musculoskeletal: No acute bony abnormality. IMPRESSION: New airspace disease in the left lower lobe greater than right lower lobe compatible with multifocal pneumonia. Chronic band like densities in the right upper lobe and left upper lobe compatible with post treatment scarring. Coronary artery disease, aortic atherosclerosis. No acute findings in the abdomen or pelvis. Electronically Signed   By: Janeece Mechanic M.D.   On: 04/25/2024 22:20   DG Chest Port 1 View Result Date: 04/25/2024 CLINICAL DATA:  Fall. EXAM: PORTABLE CHEST 1 VIEW COMPARISON:  CT chest 03/01/2024 FINDINGS: Stable cardiomediastinal silhouette. Nodular scarring in both mid lungs. The scarring is about a fiducial marker in the right mid lung. Chronic bronchitic changes. Hyperinflation. No new focal consolidation, pleural effusion, or pneumothorax. IMPRESSION: 1. No acute  cardiopulmonary disease. 2. Nodular scarring in both mid lungs. 3. Emphysema. Electronically Signed   By: Rozell Cornet M.D.   On: 04/25/2024 21:14   Scheduled Meds:  apixaban   5 mg Oral BID   arformoterol   15 mcg Nebulization BID   And   umeclidinium bromide   1 puff Inhalation Daily   atorvastatin   40 mg Oral Daily   dextromethorphan-guaiFENesin   2 tablet Oral BID   levETIRAcetam   500 mg Oral BID   pantoprazole   40 mg Oral Daily   Continuous Infusions:  azithromycin  Stopped (04/27/24 0013)   cefTRIAXone  (ROCEPHIN )  IV Stopped (04/26/24 2247)     LOS: 1 day    Time spent: 50 mins    Magdalene School, MD [facility]   If 7PM-7AM, please contact night-coverage

## 2024-04-27 NOTE — Care Plan (Signed)
 Patient with aphasia s/p CVA unable to speak clearly. Able to comprehend a "yes" every now and then. Communication paper trial; pt nods yes when asked if he is unable to read. Pt nods yes that he can see well but has a little bit of difficulty hearing. Communicating is very challenging depending on the assessor to narrow down the right yes/no questions so that he can indicate with head nod. He has been able to point at things to guide questions with his left hand. Pt.has become irritated/inpatient at times when I am unable to ask the right questions. He will tap his chest and repeat non-comprehensible grunting?  Plan of care ongoing.

## 2024-04-28 DIAGNOSIS — J189 Pneumonia, unspecified organism: Secondary | ICD-10-CM | POA: Diagnosis not present

## 2024-04-28 DIAGNOSIS — I480 Paroxysmal atrial fibrillation: Secondary | ICD-10-CM

## 2024-04-28 MED ORDER — SALINE SPRAY 0.65 % NA SOLN
1.0000 | NASAL | Status: DC | PRN
  Administered 2024-04-28: 1 via NASAL
  Filled 2024-04-28: qty 44

## 2024-04-28 MED ORDER — SODIUM CHLORIDE 0.9 % IV BOLUS
250.0000 mL | Freq: Once | INTRAVENOUS | Status: AC
  Administered 2024-04-28: 250 mL via INTRAVENOUS

## 2024-04-28 MED ORDER — DOCUSATE SODIUM 50 MG PO CAPS
50.0000 mg | ORAL_CAPSULE | Freq: Once | ORAL | Status: AC
  Administered 2024-04-28: 50 mg via ORAL
  Filled 2024-04-28: qty 1

## 2024-04-28 MED ORDER — CARMEX CLASSIC LIP BALM EX OINT
TOPICAL_OINTMENT | CUTANEOUS | Status: DC | PRN
  Filled 2024-04-28: qty 10

## 2024-04-28 MED ORDER — SODIUM CHLORIDE 0.9 % IV BOLUS
1000.0000 mL | Freq: Once | INTRAVENOUS | Status: AC
  Administered 2024-04-28: 1000 mL via INTRAVENOUS

## 2024-04-28 NOTE — Plan of Care (Signed)

## 2024-04-28 NOTE — Significant Event (Signed)
 Rapid Response Event Note   Reason for Call :  A-fib RVR, hypotension, temp 101.4   Initial Focused Assessment:  Patient alert in bed, following commands. Patient does have right sided weakness and aphasia due to prior CVA. Initial Vital signs temp 98.1, BP 75/51, HR 149, Spo2 94 on 3L [place]. Lung sounds clear/ diminished. 1L [place] bolus started, EKG obtained which showed A-fib RVR. Half way through the [place]acl bolus patient converted into Sinus Tach in the low 100's.   Interventions:  Tele monitoring  1L [place] bolus  Cardiology consult  Plan of Care:  Repeat EKG post Nacl bolus  Notify MD and Rapid response if patient declines  Event Summary:  Rapid rounded back at 0900, patient alert eating breakfast, HR maintains sinus tach in low 100's. MD [facility] notified, patient to remain on 6E.  MD Notified: Magdalene School MD Call Time: 1610 Arrival RUEA:[id number] End Time: 0830  Lacretia Piccolo, RN

## 2024-04-28 NOTE — Progress Notes (Signed)
 PROGRESS NOTE    Todd Estrada  WJX:[id number] DOB: August 13, 1961 DOA: 04/25/2024 PCP: Dorthy Gavia, MD   Brief Narrative: This 63 yrs old male with medical history significant for heart failure with preserved EF, prior CVA, PAD being admitted to the hospital with several days of cough and congestion and  found to have community-acquired pneumonia.  He was initially brought to the ER yesterday evening after sliding out of bed at home, no loss of consciousness,  no injury and no head injury was reported.  Workup reveals evidence of multifocal pneumonia, hypoxia.  He had been started on Augmentin  for 5 days on 5/7.  Patient was admitted for further evaluation and started on antibiotics for community-acquired pneumonia.  Assessment & Plan:   Principal Problem:   CAP (community acquired pneumonia)  Acute hypoxic respiratory failure: Community-acquired pneumonia: Patient does not meet criteria for SIRS.   He was hypoxic requiring 3 L of supplemental oxygen on arrival. Continue supplemental oxygen and wean as tolerated. CT chest abdomen and pelvis shows multifocal pneumonia Continue empiric antibiotics ( ceftriaxone  and Zithromax  ). Lung sounds clear without any evidence of COPD exacerbation.   Acute kidney injury: > Resolved. Likely prerenal due to decreased intake last few days. Avoid nephrotoxic medications. Renal function improved with IV hydration.   History of CVA: continue Keppra  and Eliquis .   Hyperlipidemia: Continue Lipitor   COPD: He does not use oxygen at baseline,  currently no obvious evidence of acute exacerbation of COPD. Continue home inhalers.   GERD: Continue Protonix .   Hypokalemia: Replaced.  Continue to monitor.  Acute blood loss anemia: Hemoglobin dropped from 11.9>9.6 Obtain stool for occult blood, repeat H&H.  Paroxysmal A.Fibrillation: HR now controlled, converted into NSR. Cardiology consulted.  DVT prophylaxis: Eliquis  Code Status: Full  code Family Communication: DNR Disposition Plan:    Status is: Inpatient Remains inpatient appropriate because: Severity of illness.   Consultants:  Cardiology  Procedures: None  Antimicrobials: Anti-infectives (From admission, onward)    Start     Dose/Rate Route Frequency Ordered Stop   04/26/24 2200  azithromycin  (ZITHROMAX ) 500 mg in sodium chloride  0.9 % 250 mL IVPB        500 mg 250 mL/hr over 60 Minutes Intravenous Every 24 hours 04/26/24 0749 04/30/24 2159   04/26/24 2200  cefTRIAXone  (ROCEPHIN ) 1 g in sodium chloride  0.9 % 100 mL IVPB        1 g 200 mL/hr over 30 Minutes Intravenous Every 24 hours 04/26/24 0749 04/30/24 2159   04/26/24 0030  cefTRIAXone  (ROCEPHIN ) 1 g in sodium chloride  0.9 % 100 mL IVPB        1 g 200 mL/hr over 30 Minutes Intravenous  Once 04/26/24 0028 04/26/24 0153   04/26/24 0030  azithromycin  (ZITHROMAX ) 500 mg in sodium chloride  0.9 % 250 mL IVPB        500 mg 250 mL/hr over 60 Minutes Intravenous  Once 04/26/24 0028 04/26/24 0153      Subjective: Patient was seen and examined at bedside. Overnight events noted. Patient reports feeling better.  He denies any bleeding.  Patient had an episode of paroxysmal A-fib with RVR and hypotension which improved with fluid bolus.  He converted to normal sinus rhythm.  Objective: Vitals:   04/28/24 0800 04/28/24 0924 04/28/24 1034 04/28/24 1155  BP: 111/74  101/64 105/63  Pulse:   90 86  Resp: 20   20  Temp:   98.6 F (37 C) 98.3 F (36.8 C)  TempSrc:  Oral Oral  SpO2: 95% 93% 96% 95%  Weight:      Height:        Intake/Output Summary (Last 24 hours) at 04/28/2024 1308 Last data filed at 04/28/2024 0944 Gross per 24 hour  Intake 1913.47 ml  Output 1275 ml  Net 638.47 ml   Filed Weights   04/26/24 1008  Weight: 90.9 kg    Examination:  General exam: Appears  comfortable, deconditioned, not in any acute distress. Respiratory system: CTA Bilaterally. Respiratory effort normal.  RR  16 Cardiovascular system: S1 & S2 heard, RRR. No JVD, murmurs, rubs, gallops or clicks.  Gastrointestinal system: Abdomen is non distended, soft and non tender.  Normal bowel sounds heard. Central nervous system: Alert and oriented x 3. No focal neurological deficits. Extremities: No edema, no cyanosis, no clubbing Skin: No rashes, lesions or ulcers Psychiatry: Judgement and insight appear normal. Mood & affect appropriate.     Data Reviewed: I have personally reviewed following labs and imaging studies  CBC: Recent Labs  Lab 04/23/24 1543 04/25/24 2042 04/25/24 2050 04/27/24 0904 04/27/24 1424  WBC 13.1* 7.6  --  9.0  --   NEUTROABS 11.3* 6.5  --   --   --   HGB 12.3* 11.4* 11.9* 7.7* 9.6*  HCT 36.2* 37.4* 35.0* 25.6* 30.8*  MCV 89 95.2  --  99.6  --   PLT 163 156  --  158  --    Basic Metabolic Panel: Recent Labs  Lab 04/25/24 2042 04/25/24 2050 04/27/24 0904  NA 141 141 138  K 3.6 3.7 3.3*  CL 104 105 103  CO2 26  --  25  GLUCOSE 181* 184* 140*  BUN 23 22 10   CREATININE 1.74* 1.90* 0.85  CALCIUM  9.0  --  8.3*   GFR: Estimated Creatinine Clearance: 97.6 mL/min (by C-G formula based on SCr of 0.85 mg/dL). Liver Function Tests: Recent Labs  Lab 04/25/24 2042  AST 38  ALT 30  ALKPHOS 73  BILITOT 1.5*  PROT 7.6  ALBUMIN  3.4*   No results for input(s): "LIPASE", "AMYLASE" in the last 168 hours. No results for input(s): "AMMONIA" in the last 168 hours. Coagulation Profile: Recent Labs  Lab 04/25/24 2042  INR 1.8*   Cardiac Enzymes: No results for input(s): "CKTOTAL", "CKMB", "CKMBINDEX", "TROPONINI" in the last 168 hours. BNP (last 3 results) No results for input(s): "PROBNP" in the last 8760 hours. HbA1C: No results for input(s): "HGBA1C" in the last 72 hours. CBG: No results for input(s): "GLUCAP" in the last 168 hours. Lipid Profile: No results for input(s): "CHOL", "HDL", "LDLCALC", "TRIG", "CHOLHDL", "LDLDIRECT" in the last 72 hours. Thyroid  Function Tests: No results for input(s): "TSH", "T4TOTAL", "FREET4", "T3FREE", "THYROIDAB" in the last 72 hours. Anemia Panel: No results for input(s): "VITAMINB12", "FOLATE", "FERRITIN", "TIBC", "IRON", "RETICCTPCT" in the last 72 hours. Sepsis Labs: Recent Labs  Lab 04/25/24 2049  LATICACIDVEN 1.7    Recent Results (from the past 240 hours)  Resp panel by RT-PCR (RSV, Flu A&B, Covid) Anterior Nasal Swab     Status: None   Collection Time: 04/23/24  3:14 PM   Specimen: Anterior Nasal Swab  Result Value Ref Range Status   SARS Coronavirus 2 by RT PCR NEGATIVE NEGATIVE Final   Influenza A by PCR NEGATIVE NEGATIVE Final   Influenza B by PCR NEGATIVE NEGATIVE Final    Comment: (NOTE) The Xpert Xpress SARS-CoV-2/FLU/RSV plus assay is intended as an aid in the diagnosis of influenza from  Nasopharyngeal swab specimens and should not be used as a sole basis for treatment. Nasal washings and aspirates are unacceptable for Xpert Xpress SARS-CoV-2/FLU/RSV testing.  Fact Sheet for Patients: BloggerCourse.com  Fact Sheet for Healthcare Providers: SeriousBroker.it  This test is not yet approved or cleared by the [place]  FDA and has been authorized for detection and/or diagnosis of SARS-CoV-2 by FDA under an Emergency Use Authorization (EUA). This EUA will remain in effect (meaning this test can be used) for the duration of the COVID-19 declaration under Section 564(b)(1) of the Act, 21 U.S.C. section 360bbb-3(b)(1), unless the authorization is terminated or revoked.     Resp Syncytial Virus by PCR NEGATIVE NEGATIVE Final    Comment: (NOTE) Fact Sheet for Patients: BloggerCourse.com  Fact Sheet for Healthcare Providers: SeriousBroker.it  This test is not yet approved or cleared by the [place]  FDA and has been authorized for detection and/or diagnosis of SARS-CoV-2 by FDA  under an Emergency Use Authorization (EUA). This EUA will remain in effect (meaning this test can be used) for the duration of the COVID-19 declaration under Section 564(b)(1) of the Act, 21 U.S.C. section 360bbb-3(b)(1), unless the authorization is terminated or revoked.  Performed at [facility], [address], [place], [place] [address]   Resp panel by RT-PCR (RSV, Flu A&B, Covid) Anterior Nasal Swab     Status: None   Collection Time: 04/25/24  8:33 PM   Specimen: Anterior Nasal Swab  Result Value Ref Range Status   SARS Coronavirus 2 by RT PCR NEGATIVE NEGATIVE Final    Comment: (NOTE) SARS-CoV-2 target nucleic acids are NOT DETECTED.  The SARS-CoV-2 RNA is generally detectable in upper respiratory specimens during the acute phase of infection. The lowest concentration of SARS-CoV-2 viral copies this assay can detect is 138 copies/mL. A negative result does not preclude SARS-Cov-2 infection and should not be used as the sole basis for treatment or other patient management decisions. A negative result may occur with  improper specimen collection/handling, submission of specimen other than nasopharyngeal swab, presence of viral mutation(s) within the areas targeted by this assay, and inadequate number of viral copies(<138 copies/mL). A negative result must be combined with clinical observations, patient history, and epidemiological information. The expected result is Negative.  Fact Sheet for Patients:  BloggerCourse.com  Fact Sheet for Healthcare Providers:  SeriousBroker.it  This test is no t yet approved or cleared by the [place]  FDA and  has been authorized for detection and/or diagnosis of SARS-CoV-2 by FDA under an Emergency Use Authorization (EUA). This EUA will remain  in effect (meaning this test can be used) for the duration of the COVID-19 declaration under Section 564(b)(1) of the Act,  21 U.S.C.section 360bbb-3(b)(1), unless the authorization is terminated  or revoked sooner.       Influenza A by PCR NEGATIVE NEGATIVE Final   Influenza B by PCR NEGATIVE NEGATIVE Final    Comment: (NOTE) The Xpert Xpress SARS-CoV-2/FLU/RSV plus assay is intended as an aid in the diagnosis of influenza from Nasopharyngeal swab specimens and should not be used as a sole basis for treatment. Nasal washings and aspirates are unacceptable for Xpert Xpress SARS-CoV-2/FLU/RSV testing.  Fact Sheet for Patients: BloggerCourse.com  Fact Sheet for Healthcare Providers: SeriousBroker.it  This test is not yet approved or cleared by the [place]  FDA and has been authorized for detection and/or diagnosis of SARS-CoV-2 by FDA under an Emergency Use Authorization (EUA). This EUA will remain in effect (meaning this test  can be used) for the duration of the COVID-19 declaration under Section 564(b)(1) of the Act, 21 U.S.C. section 360bbb-3(b)(1), unless the authorization is terminated or revoked.     Resp Syncytial Virus by PCR NEGATIVE NEGATIVE Final    Comment: (NOTE) Fact Sheet for Patients: BloggerCourse.com  Fact Sheet for Healthcare Providers: SeriousBroker.it  This test is not yet approved or cleared by the [place]  FDA and has been authorized for detection and/or diagnosis of SARS-CoV-2 by FDA under an Emergency Use Authorization (EUA). This EUA will remain in effect (meaning this test can be used) for the duration of the COVID-19 declaration under Section 564(b)(1) of the Act, 21 U.S.C. section 360bbb-3(b)(1), unless the authorization is terminated or revoked.  Performed at [facility], [address], [place], [place] [address]   Blood Culture (routine x 2)     Status: None (Preliminary result)   Collection Time: 04/25/24  8:42 PM   Specimen: BLOOD   Result Value Ref Range Status   Specimen Description   Final    BLOOD LEFT ANTECUBITAL Performed at [facility], [address] [place], [place] [address]    Special Requests   Final    BOTTLES DRAWN AEROBIC AND ANAEROBIC Blood Culture adequate volume Performed at [facility], [address], [place], [place] [address]    Culture   Final    NO GROWTH 3 DAYS Performed at [facility], [address] [place], [place] [address]    Report Status PENDING  Incomplete  Blood Culture (routine x 2)     Status: None (Preliminary result)   Collection Time: 04/25/24  8:42 PM   Specimen: BLOOD LEFT FOREARM  Result Value Ref Range Status   Specimen Description   Final    BLOOD LEFT FOREARM Performed at [facility], [address], [place], [place] [address]    Special Requests   Final    BOTTLES DRAWN AEROBIC AND ANAEROBIC Blood Culture adequate volume Performed at [facility], [address] [place], [place] [address]    Culture   Final    NO GROWTH 3 DAYS Performed at [facility], [address], [place], [place] [address]    Report Status PENDING  Incomplete    Radiology Studies: No results found.  Scheduled Meds:  apixaban   5 mg Oral BID   arformoterol   15 mcg Nebulization BID   And   umeclidinium bromide   1 puff Inhalation Daily   atorvastatin   40 mg Oral Daily   dextromethorphan-guaiFENesin   2 tablet Oral BID   feeding supplement  237 mL Oral BID BM   levETIRAcetam   500 mg Oral BID   pantoprazole   40 mg Oral Daily   Continuous Infusions:  azithromycin  Stopped (04/28/24 0012)   cefTRIAXone  (ROCEPHIN )  IV Stopped (04/27/24 2238)     LOS: 2 days    Time spent: 50 mins    Magdalene School, MD [facility]   If 7PM-7AM, please contact night-coverage

## 2024-04-28 NOTE — Progress Notes (Signed)
 Cardiology Consultation   Patient ID: Todd Estrada MRN: [id number]; DOB: 12/27/1960  Admit date: 04/25/2024 Date of Consult: 04/28/2024  PCP:  Dorthy Gavia, MD    [facility] Providers Cardiologist:  Jann Melody, MD   { Patient Profile:   Todd Estrada is a 63 y.o. male with a hx of HTN, hypotension, CP, CVA, PAD, PE who is being seen 04/28/2024 for the evaluation of afib with RVR at the request of Dr Elsworth Halt.  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  Slid out of bed.  He was hypoxic on arrival  Work up consistent with multifocal pneumonia Started on ABX    Last night pt developed afib with RVR    He has since converted to SR  The pt says he has been feeling palpitaitons at home some    More recently   No dizziness        Past Medical History:  Diagnosis Date   Asthma    Atypical chest pain 08/13/2022   Community acquired pneumonia 09/14/2022   COPD (chronic obstructive pulmonary disease) (HCC)    Essential hypertension 08/19/2021   GERD (gastroesophageal reflux disease)    HAP (hospital-acquired pneumonia) 09/16/2022   History of tracheostomy    03/09/22-04/11/22   HLD (hyperlipidemia)    Hypertension    Hypokalemia 08/13/2022   Hypomagnesemia 08/13/2022   Lung cancer (HCC)    PAD (peripheral artery disease) (HCC)    Seizures (HCC) 06/02/2022   Sepsis (HCC) 08/13/2022   Sepsis due to pneumonia (HCC) 04/13/2023   Stroke (HCC) 02/2022    Past Surgical History:  Procedure Laterality Date   BRONCHIAL BIOPSY  05/30/2022   Procedure: BRONCHIAL BIOPSIES;  Surgeon: Denson Flake, MD;  Location: [facility] ENDOSCOPY;  Service:  Pulmonary;;   BRONCHIAL BRUSHINGS  05/30/2022   Procedure: BRONCHIAL BRUSHINGS;  Surgeon: Denson Flake, MD;  Location: [facility] ENDOSCOPY;  Service: Pulmonary;;   BRONCHIAL NEEDLE ASPIRATION BIOPSY  05/30/2022   Procedure: BRONCHIAL NEEDLE ASPIRATION BIOPSIES;  Surgeon: Denson Flake, MD;  Location: [facility] ENDOSCOPY;  Service: Pulmonary;;   BRONCHIAL WASHINGS  05/30/2022   Procedure: BRONCHIAL WASHINGS;  Surgeon: Denson Flake, MD;  Location: [facility] ENDOSCOPY;  Service: Pulmonary;;   CHOLECYSTECTOMY N/A 07/05/2023   Procedure: LAPAROSCOPIC CHOLECYSTECTOMY WITH INTRAOPERATIVE CHOLANGIOGRAM;  Surgeon: Candyce Champagne, MD;  Location: [facility] ORS;  Service: General;  Laterality: N/A;   ERCP N/A 07/06/2023   Procedure: ENDOSCOPIC RETROGRADE CHOLANGIOPANCREATOGRAPHY (ERCP);  Surgeon: Ozell Blunt, MD;  Location: [facility] ENDOSCOPY;  Service: Gastroenterology;  Laterality: N/A;   ESOPHAGOGASTRODUODENOSCOPY (EGD) WITH PROPOFOL  N/A 03/11/2022   Procedure: ESOPHAGOGASTRODUODENOSCOPY (EGD) WITH PROPOFOL ;  Surgeon: Dorena Gander, MD;  Location: [facility] ENDOSCOPY;  Service: General;  Laterality: N/A;   FIDUCIAL MARKER PLACEMENT  05/30/2022   Procedure: FIDUCIAL MARKER PLACEMENT;  Surgeon: Denson Flake, MD;  Location: [facility] ENDOSCOPY;  Service: Pulmonary;;   IR ANGIO INTRA EXTRACRAN SEL COM CAROTID INNOMINATE UNI R MOD SED  03/02/2022   IR CT HEAD LTD  03/02/2022   IR PERCUTANEOUS ART THROMBECTOMY/INFUSION INTRACRANIAL INC DIAG ANGIO  03/02/2022   PEG PLACEMENT N/A  03/11/2022   Procedure: PERCUTANEOUS ENDOSCOPIC GASTROSTOMY (PEG) PLACEMENT;  Surgeon: Dorena Gander, MD;  Location: [facility] ENDOSCOPY;  Service: General;  Laterality: N/A;   RADIOLOGY WITH ANESTHESIA N/A 03/02/2022   Procedure: IR WITH ANESTHESIA;  Surgeon: Luellen Sages, MD;  Location: [facility] OR;  Service: Radiology;  Laterality: N/A;   REMOVAL OF STONES  07/06/2023   Procedure: REMOVAL OF STONES;  Surgeon: Ozell Blunt, MD;  Location: [facility] ENDOSCOPY;  Service: Gastroenterology;;    [address]  07/06/2023   Procedure: [address];  Surgeon: Ozell Blunt, MD;  Location: [facility] ENDOSCOPY;  Service: Gastroenterology;;   VIDEO BRONCHOSCOPY WITH RADIAL ENDOBRONCHIAL ULTRASOUND  05/30/2022   Procedure: VIDEO BRONCHOSCOPY WITH RADIAL ENDOBRONCHIAL ULTRASOUND;  Surgeon: Denson Flake, MD;  Location: [facility] ENDOSCOPY;  Service: Pulmonary;;     Home Medications:  Prior to Admission medications   Medication Sig Start Date End Date Taking? Authorizing Provider  albuterol  (PROVENTIL ) (2.5 MG/3ML) 0.083% nebulizer solution Take 3 mLs (2.5 mg total) by nebulization every 4 (four) hours as needed for wheezing or shortness of breath. 08/16/22  Yes Amponsah, Annette Killings, MD  apixaban  (ELIQUIS ) 5 MG TABS tablet Take 1 tablet (5 mg total) by mouth 2 (two) times daily. 04/07/24  Yes Dorthy Gavia, MD  atorvastatin  (LIPITOR) 40 MG tablet Take 1 tablet (40 mg total) by mouth daily. 02/06/24 02/05/25 Yes Dorthy Gavia, MD  levETIRAcetam  (KEPPRA ) 500 MG tablet Take 1 tablet (500 mg total) by mouth 2 (two) times daily. 04/18/24  Yes McCue, Camilo Cella, NP  midodrine  (PROAMATINE ) 2.5 MG tablet Take 2.5 mg by mouth in the morning and at bedtime.   Yes [provider]  Multiple Vitamin (MULTIVITAMIN WITH MINERALS) TABS tablet Take 1 tablet by mouth daily.   Yes [provider]  pantoprazole  (PROTONIX ) 40 MG tablet TAKE 1 TABLET(40 MG) BY MOUTH DAILY Patient taking differently: Take 40 mg by mouth in the morning. 03/04/24  Yes Dorthy Gavia, MD  Tiotropium Bromide-Olodaterol (STIOLTO RESPIMAT ) 2.5-2.5 MCG/ACT AERS NEW PRESCRIPTION REQUEST: STIOLTO 2.5 MCG- INHALE TWO PUFFS BY MOUTH DAILY Patient taking differently: Take 2 puffs by mouth daily at 12 noon. 02/07/24  Yes Dorthy Gavia, MD  amoxicillin -clavulanate (AUGMENTIN ) 875-125 MG tablet Take 1 tablet by mouth 2 (two) times daily for 5 days. Patient not taking: Reported on 04/26/2024 04/24/24 04/29/24  Jonelle Neri, DO   chlorpheniramine-HYDROcodone (TUSSIONEX) 10-8 MG/5ML Take 5 mLs by mouth every 12 (twelve) hours as needed for cough. Patient not taking: Reported on 04/26/2024 04/23/24   Jonelle Neri, DO  hyoscyamine  (LEVSIN/SL) 0.125 MG SL tablet Place 1 tablet (0.125 mg total) under the tongue every 4 (four) hours as needed. Patient not taking: Reported on 04/26/2024 01/12/24   Carin Charleston, MD    Inpatient Medications: Scheduled Meds:  apixaban   5 mg Oral BID   arformoterol   15 mcg Nebulization BID   And   umeclidinium bromide   1 puff Inhalation Daily   atorvastatin   40 mg Oral Daily   dextromethorphan-guaiFENesin   2 tablet Oral BID   feeding supplement  237 mL Oral BID BM   levETIRAcetam   500 mg Oral BID   pantoprazole   40 mg Oral Daily   Continuous Infusions:  azithromycin  Stopped (04/28/24 0012)   cefTRIAXone  (ROCEPHIN )  IV Stopped (04/27/24 2238)   sodium chloride      PRN Meds: acetaminophen  **OR** acetaminophen , chlorpheniramine-HYDROcodone, ondansetron  **OR** ondansetron  (ZOFRAN ) IV, mouth rinse, traZODone   Allergies:   No Known Allergies  Social History:   Social History   Socioeconomic History  Marital status: Widowed    Spouse name: Not on file   Number of children: Not on file   Years of education: Not on file   Highest education level: Not on file  Occupational History   Not on file  Tobacco Use   Smoking status: Former    Current packs/day: 0.00    Types: Cigarettes    Quit date: 05/02/2022    Years since quitting: 1.9    Passive exposure: Never   Smokeless tobacco: Never  Vaping Use   Vaping status: Never Used  Substance and Sexual Activity   Alcohol use: Not Currently   Drug use: No   Sexual activity: Not Currently  Other Topics Concern   Not on file  Social History Narrative   Not on file   Social Drivers of Health   Financial Resource Strain: Not on file  Food Insecurity: No Food Insecurity (04/26/2024)   Hunger Vital Sign    Worried About Running Out of  Food in the Last Year: Never true    Ran Out of Food in the Last Year: Never true  Transportation Needs: No Transportation Needs (04/26/2024)   PRAPARE - Administrator, Civil Service (Medical): No    Lack of Transportation (Non-Medical): No  Physical Activity: Not on file  Stress: Not on file  Social Connections: Socially Isolated (04/26/2024)   Social Connection and Isolation Panel [NHANES]    Frequency of Communication with Friends and Family: Once a week    Frequency of Social Gatherings with Friends and Family: Once a week    Attends Religious Services: Never    Database administrator or Organizations: No    Attends Banker Meetings: Never    Marital Status: Widowed  Intimate Partner Violence: Not At Risk (04/26/2024)   Humiliation, Afraid, Rape, and Kick questionnaire    Fear of Current or Ex-Partner: No    Emotionally Abused: No    Physically Abused: No    Sexually Abused: No    Family History:    Family History  Problem Relation Age of Onset   Throat cancer Mother    Liver cancer Father    Kidney failure Sister    Cancer - Lung Paternal Uncle      ROS:  Please see the history of present illness.  All other ROS reviewed and negative.     Physical Exam/Data:   Vitals:   04/28/24 0755 04/28/24 0800 04/28/24 0924 04/28/24 1034  BP: 102/64 111/74  101/64  Pulse:    90  Resp: 20 20    Temp:    98.6 F (37 C)  TempSrc:    Oral  SpO2: 95% 95% 93% 96%  Weight:      Height:        Intake/Output Summary (Last 24 hours) at 04/28/2024 1057 Last data filed at 04/28/2024 0653 Gross per 24 hour  Intake 1553.47 ml  Output 1675 ml  Net -121.53 ml      04/26/2024   10:08 AM 04/23/2024    2:41 PM 12/09/2023   10:12 PM  Last 3 Weights  Weight (lbs) 200 lb 6.4 oz 200 lb 4.8 oz 184 lb  Weight (kg) 90.9 kg 90.855 kg 83.462 kg     Body mass index is 27.17 kg/m.  General: PT is a 63 yo appearing older than stated age  In NAD  HEENT: normal Neck: no  JVD  No bruits  Cardiac:  normal S1, S2; RRR; no  murmurs Lungs: rhonchi bilaterally  Abd: soft, nontender, no hepatomegaly  Ext: no LE  edema 2+ DP pulses  Neuro:  Deferred    EKG:  The EKG was personally reviewed and demonstrates:  ST 134 bpm   Anteroseptal MI Telemetry:  Telemetry was personally reviewed and demonstrates:  Afib with RVR (140s) now SR    Relevant CV Studies:  Echo July 2024 1. Left ventricular ejection fraction, by estimation, is 60 to 65%. The  left ventricle has normal function. Left ventricular endocardial border  not optimally defined to evaluate regional wall motion. There is mild left  ventricular hypertrophy. Left  ventricular diastolic parameters are consistent with Grade I diastolic  dysfunction (impaired relaxation).   2. Right ventricular systolic function is mildly reduced. The right  ventricular size is normal.   3. The mitral valve is normal in structure. Trivial mitral valve  regurgitation. No evidence of mitral stenosis.   4. The aortic valve was not well visualized. Aortic valve regurgitation  is not visualized. No aortic stenosis is present.   5. Aortic dilatation noted. There is borderline dilatation of the  ascending aorta, measuring 39 mm.   6. The inferior vena cava is normal in size with greater than 50%  respiratory variability, suggesting right atrial pressure of 3 mmHg.   7. Technically very limited study with poor images even with Definity   contrast. LV function appears normal. Suspect RV function mildly  decreased.   Laboratory Data:  High Sensitivity Troponin:  No results for input(s): "TROPONINIHS" in the last 720 hours.   Chemistry Recent Labs  Lab 04/25/24 2042 04/25/24 2050 04/27/24 0904  NA 141 141 138  K 3.6 3.7 3.3*  CL 104 105 103  CO2 26  --  25  GLUCOSE 181* 184* 140*  BUN 23 22 10   CREATININE 1.74* 1.90* 0.85  CALCIUM  9.0  --  8.3*  GFRNONAA 44*  --  >60  ANIONGAP 11  --  10    Recent Labs  Lab  04/25/24 2042  PROT 7.6  ALBUMIN  3.4*  AST 38  ALT 30  ALKPHOS 73  BILITOT 1.5*   Lipids No results for input(s): "CHOL", "TRIG", "HDL", "LABVLDL", "LDLCALC", "CHOLHDL" in the last 168 hours.  Hematology Recent Labs  Lab 04/23/24 1543 04/25/24 2042 04/25/24 2042 04/25/24 2050 04/27/24 0904 04/27/24 1424  WBC 13.1* 7.6  --   --  9.0  --   RBC 4.07* 3.93*  --   --  2.57*  --   HGB 12.3* 11.4*   < > 11.9* 7.7* 9.6*  HCT 36.2* 37.4*   < > 35.0* 25.6* 30.8*  MCV 89 95.2  --   --  99.6  --   MCH 30.2 29.0  --   --  30.0  --   MCHC 34.0 30.5  --   --  30.1  --   RDW 12.7 13.4  --   --  13.7  --   PLT 163 156  --   --  158  --    < > = values in this interval not displayed.   Thyroid No results for input(s): "TSH", "FREET4" in the last 168 hours.  BNPNo results for input(s): "BNP", "PROBNP" in the last 168 hours.  DDimer No results for input(s): "DDIMER" in the last 168 hours.   Radiology/Studies:  CT CHEST ABDOMEN PELVIS W CONTRAST Result Date: 04/25/2024 CLINICAL DATA:  Fever, cough EXAM: CT CHEST, ABDOMEN, AND PELVIS WITH CONTRAST TECHNIQUE: Multidetector CT  imaging of the chest, abdomen and pelvis was performed following the standard protocol during bolus administration of intravenous contrast. RADIATION DOSE REDUCTION: This exam was performed according to the departmental dose-optimization program which includes automated exposure control, adjustment of the mA and/or kV according to patient size and/or use of iterative reconstruction technique. CONTRAST:  80mL OMNIPAQUE  IOHEXOL  300 MG/ML  SOLN COMPARISON:  Abdominal CT 04/01/2024.  Chest CT 03/01/2024 FINDINGS: CT CHEST FINDINGS Cardiovascular: Heart is normal size. Aorta is normal caliber. Scattered coronary artery and aortic atherosclerosis. Mediastinum/Nodes: No mediastinal, hilar, or axillary adenopathy. Trachea and esophagus are unremarkable. Thyroid unremarkable. Lungs/Pleura: Severe centrilobular emphysema. Stable bandlike  density peripherally in the right upper lobe with central fiducial marker, similar to prior study. Stable bandlike scarring in the anterior left upper lobe. New consolidation in the left lower lobe and to a lesser extent right lower lobe most compatible with pneumonia. No effusions. Musculoskeletal: Chest wall soft tissues are unremarkable. No acute bony abnormality. Again noted is the right lateral 5th rib fracture, unchanged since prior study. CT ABDOMEN PELVIS FINDINGS Hepatobiliary: No focal liver abnormality is seen. Status post cholecystectomy. No biliary dilatation. Pancreas: No focal abnormality or ductal dilatation. Spleen: No focal abnormality.  Normal size. Adrenals/Urinary Tract: No adrenal abnormality. No focal renal abnormality. No stones or hydronephrosis. Urinary bladder is unremarkable. Stomach/Bowel: Stomach, large and small bowel grossly unremarkable. Vascular/Lymphatic: Aortic atherosclerosis. No evidence of aneurysm or adenopathy. Reproductive: No visible focal abnormality. Other: No free fluid or free air. Musculoskeletal: No acute bony abnormality. IMPRESSION: New airspace disease in the left lower lobe greater than right lower lobe compatible with multifocal pneumonia. Chronic band like densities in the right upper lobe and left upper lobe compatible with post treatment scarring. Coronary artery disease, aortic atherosclerosis. No acute findings in the abdomen or pelvis. Electronically Signed   By: Janeece Mechanic M.D.   On: 04/25/2024 22:20   DG Chest Port 1 View Result Date: 04/25/2024 CLINICAL DATA:  Fall. EXAM: PORTABLE CHEST 1 VIEW COMPARISON:  CT chest 03/01/2024 FINDINGS: Stable cardiomediastinal silhouette. Nodular scarring in both mid lungs. The scarring is about a fiducial marker in the right mid lung. Chronic bronchitic changes. Hyperinflation. No new focal consolidation, pleural effusion, or pneumothorax. IMPRESSION: 1. No acute cardiopulmonary disease. 2. Nodular scarring in both  mid lungs. 3. Emphysema. Electronically Signed   By: Rozell Cornet M.D.   On: 04/25/2024 21:14     Assessment and Plan:   PAF   Pt with episode of afib with RVR    Converted on own    He wa hypotensive during this time  He says he has sensed palpitaitons before admission.  Denies dizziness  Follow on tele for      He is on Eliquis  for PE      If no recurrence would set up for event monitor to see if having at other times   2  CAD   Coronary calcifications on CT    No sympotms to suggest angina   3  Hx hypotension   PT reportedly on midodrine  as outpt   He is not on this now Wnen in afib was hypotensive.  BP better now  but still low normal Would add back at low dose 2.5 2x per day   Follow BP   4  HL    Continue stattin     5 Pneumonia    RLL on CT  On Abx  6   Lung CA  Follows in oncology  Last seen in Nov 2024  s/p chemoradiation  Has pleural based mass  Plan for palliative XRT    For questions or updates, please contact [facility] Please consult [email or web address] for contact info under    Signed, Ola Berger, MD  04/28/2024 10:57 AM

## 2024-04-28 NOTE — Plan of Care (Signed)

## 2024-04-29 ENCOUNTER — Other Ambulatory Visit: Payer: Self-pay

## 2024-04-29 DIAGNOSIS — Z8673 Personal history of transient ischemic attack (TIA), and cerebral infarction without residual deficits: Secondary | ICD-10-CM

## 2024-04-29 DIAGNOSIS — I739 Peripheral vascular disease, unspecified: Secondary | ICD-10-CM

## 2024-04-29 DIAGNOSIS — J9601 Acute respiratory failure with hypoxia: Secondary | ICD-10-CM

## 2024-04-29 DIAGNOSIS — I959 Hypotension, unspecified: Secondary | ICD-10-CM

## 2024-04-29 DIAGNOSIS — J189 Pneumonia, unspecified organism: Secondary | ICD-10-CM | POA: Diagnosis not present

## 2024-04-29 DIAGNOSIS — I2510 Atherosclerotic heart disease of native coronary artery without angina pectoris: Secondary | ICD-10-CM

## 2024-04-29 DIAGNOSIS — I2584 Coronary atherosclerosis due to calcified coronary lesion: Secondary | ICD-10-CM

## 2024-04-29 LAB — CBC
HCT: 36.7 % — ABNORMAL LOW (ref 39.0–52.0)
Hemoglobin: 11.1 g/dL — ABNORMAL LOW (ref 13.0–17.0)
MCH: 28.9 pg (ref 26.0–34.0)
MCHC: 30.2 g/dL (ref 30.0–36.0)
MCV: 95.6 fL (ref 80.0–100.0)
Platelets: 181 10*3/uL (ref 150–400)
RBC: 3.84 MIL/uL — ABNORMAL LOW (ref 4.22–5.81)
RDW: 13.6 % (ref 11.5–15.5)
WBC: 6 10*3/uL (ref 4.0–10.5)
nRBC: 0 % (ref 0.0–0.2)

## 2024-04-29 LAB — BASIC METABOLIC PANEL WITH GFR
Anion gap: 11 (ref 5–15)
BUN: 10 mg/dL (ref 8–23)
CO2: 29 mmol/L (ref 22–32)
Calcium: 8.6 mg/dL — ABNORMAL LOW (ref 8.9–10.3)
Chloride: 98 mmol/L (ref 98–111)
Creatinine, Ser: 0.76 mg/dL (ref 0.61–1.24)
GFR, Estimated: >60 mL/min (ref >60)
Glucose, Bld: 109 mg/dL — ABNORMAL HIGH (ref 70–99)
Potassium: 3.7 mmol/L (ref 3.5–5.1)
Sodium: 138 mmol/L (ref 135–145)

## 2024-04-29 LAB — MAGNESIUM: Magnesium: 2.1 mg/dL (ref 1.7–2.4)

## 2024-04-29 LAB — PHOSPHORUS: Phosphorus: 3.7 mg/dL (ref 2.5–4.6)

## 2024-04-29 NOTE — Progress Notes (Signed)
 Rounding Note    Patient Name: Todd Estrada Date of Encounter: 04/29/2024  [facility] Cardiologist: Jann Melody, MD  Chief Complaint:  Chief Complaint  Patient presents with   Fall  Reason of consult: Atrial fibrillation with rapid ventricular rate  Subjective   Resting in bed comfortably, watching TV. Poor historian. No events overnight. Denies anginal chest pain or heart failure symptoms Spoke to RN.  Inpatient Medications    Scheduled Meds:  apixaban   5 mg Oral BID   arformoterol   15 mcg Nebulization BID   And   umeclidinium bromide   1 puff Inhalation Daily   atorvastatin   40 mg Oral Daily   dextromethorphan-guaiFENesin   2 tablet Oral BID   feeding supplement  237 mL Oral BID BM   levETIRAcetam   500 mg Oral BID   pantoprazole   40 mg Oral Daily   Continuous Infusions:  azithromycin  Stopped (04/28/24 2323)   cefTRIAXone  (ROCEPHIN )  IV Stopped (04/28/24 2207)   PRN Meds: acetaminophen  **OR** acetaminophen , chlorpheniramine-HYDROcodone, lip balm, ondansetron  **OR** ondansetron  (ZOFRAN ) IV, mouth rinse, sodium chloride , traZODone    Vital Signs    Vitals:   04/29/24 0012 04/29/24 0602 04/29/24 1008 04/29/24 1140  BP: (!) 97/56 126/71  96/61  Pulse: 79 84  90  Resp: 18 18  20   Temp: 97.7 F (36.5 C) 98.9 F (37.2 C)  99.8 F (37.7 C)  TempSrc: Oral Oral  Oral  SpO2: 92% 94% 93% 91%  Weight:      Height:        Intake/Output Summary (Last 24 hours) at 04/29/2024 1829 Last data filed at 04/29/2024 0200 Gross per 24 hour  Intake 945.39 ml  Output 900 ml  Net 45.39 ml      04/26/2024   10:08 AM 04/23/2024    2:41 PM 12/09/2023   10:12 PM  Last 3 Weights  Weight (lbs) 200 lb 6.4 oz 200 lb 4.8 oz 184 lb  Weight (kg) 90.9 kg 90.855 kg 83.462 kg      Telemetry    Sinus rhythm- Personally Reviewed  ECG    No new tracings- Personally Reviewed  Physical Exam   General: Appears older than stated age, disheveled,  hemodynamically stable, no acute distress HEENT: Dry mucous membranes, no JVP, trachea midline Lungs: Clear to auscultation bilaterally.  No wheezes rales.  Rhonchi's (anteriorly) Heart: Regular, positive S1-S2, no murmurs rubs or gallops appreciated Abdomen: Nonobese, soft, nontender, nondistended, positive bowel sounds Extremities: No edema, warm to touch Neuro: Awake and alert, knows the [place]  president, does not participate in meaningful conversation.  No family at bedside.  Spoke to the RN she also endorses that communication has been difficult and the night nurse notified her that he cannot read or write.   Labs    High Sensitivity Troponin:  No results for input(s): "TROPONINIHS" in the last 720 hours.   Chemistry Recent Labs  Lab 04/25/24 2042 04/25/24 2050 04/27/24 0904 04/29/24 0545  NA 141 141 138 138  K 3.6 3.7 3.3* 3.7  CL 104 105 103 98  CO2 26  --  25 29  GLUCOSE 181* 184* 140* 109*  BUN 23 22 10 10   CREATININE 1.74* 1.90* 0.85 0.76  CALCIUM  9.0  --  8.3* 8.6*  MG  --   --   --  2.1  PROT 7.6  --   --   --   ALBUMIN  3.4*  --   --   --   AST 38  --   --   --  ALT 30  --   --   --   ALKPHOS 73  --   --   --   BILITOT 1.5*  --   --   --   GFRNONAA 44*  --  >60 >60  ANIONGAP 11  --  10 11    Lipids No results for input(s): "CHOL", "TRIG", "HDL", "LABVLDL", "LDLCALC", "CHOLHDL" in the last 168 hours.  Hematology Recent Labs  Lab 04/25/24 2042 04/25/24 2050 04/27/24 0904 04/27/24 1424 04/29/24 0545  WBC 7.6  --  9.0  --  6.0  RBC 3.93*  --  2.57*  --  3.84*  HGB 11.4*   < > 7.7* 9.6* 11.1*  HCT 37.4*   < > 25.6* 30.8* 36.7*  MCV 95.2  --  99.6  --  95.6  MCH 29.0  --  30.0  --  28.9  MCHC 30.5  --  30.1  --  30.2  RDW 13.4  --  13.7  --  13.6  PLT 156  --  158  --  181   < > = values in this interval not displayed.   Thyroid No results for input(s): "TSH", "FREET4" in the last 168 hours.  BNPNo results for input(s): "BNP", "PROBNP" in the last 168  hours.  DDimer No results for input(s): "DDIMER" in the last 168 hours.    Cardiac Studies   Echo July 2024 1. Left ventricular ejection fraction, by estimation, is 60 to 65%. The  left ventricle has normal function. Left ventricular endocardial border  not optimally defined to evaluate regional wall motion. There is mild left  ventricular hypertrophy. Left  ventricular diastolic parameters are consistent with Grade I diastolic  dysfunction (impaired relaxation).   2. Right ventricular systolic function is mildly reduced. The right  ventricular size is normal.   3. The mitral valve is normal in structure. Trivial mitral valve  regurgitation. No evidence of mitral stenosis.   4. The aortic valve was not well visualized. Aortic valve regurgitation  is not visualized. No aortic stenosis is present.   5. Aortic dilatation noted. There is borderline dilatation of the  ascending aorta, measuring 39 mm.   6. The inferior vena cava is normal in size with greater than 50%  respiratory variability, suggesting right atrial pressure of 3 mmHg.   7. Technically very limited study with poor images even with Definity   contrast. LV function appears normal. Suspect RV function mildly  decreased.   Patient Profile     63 y.o. male with a hx of HTN, hypotension, coronary calcification, CVA (right-sided deficit and aphasia), PAD, PE (already on Eliquis ), documented history of lung cancer.  Assessment & Plan    Paroxysmal atrial fibrillation: Current episode of A-fib with RVR and based on EMR spontaneously converted to sinus rhythm. Currently not on AV nodal blocking agents due to soft blood pressures. Not an ideal candidate for antiarrhythmic medications. Currently on Eliquis  given his history of PE Has had history of stroke in the past.  Coronary calcification: Asymptomatic.  Would avoid aspirin  as long as he is on anticoagulation.  No workup indicated in an asymptomatic male for now.  Chronic  hypotension: Remains on midodrine .  Hyperlipidemia: Continue statin therapy  Acute hypoxic respiratory failure: Community-acquired pneumonia: History of COPD Currently on supplemental oxygen. Currently on antibiotics. Management per primary team  History of stroke: Reemphasized the importance of secondary prevention with focus on improving her modifiable cardiovascular risk factors such as glycemic control, lipid management.  [facility] will sign off.   Medication Recommendations: Will hold off on AV nodal blocking agents given his hypotension.  On Eliquis  due to history of PE. Other recommendations (labs, testing, etc):  None  Follow up as an outpatient: 4 weeks w/ APP and September 2025 w/ primary cardiology.  For questions or updates, please contact [facility] Please consult [email or web address] for contact info under     Signed, Olinda Bertrand, DO, [facility]  [facility]  Pager: [phone] Office: [phone] 04/29/2024, 6:29 PM

## 2024-04-29 NOTE — Progress Notes (Signed)
 PROGRESS NOTE    Todd Estrada  HKV:[id number] DOB: 1961-11-03 DOA: 04/25/2024 PCP: Dorthy Gavia, MD   Brief Narrative: This 63 yrs old male with medical history significant for heart failure with preserved EF, prior CVA, PAD being admitted to the hospital with several days of cough and congestion and  found to have community-acquired pneumonia.  He was initially brought to the ER yesterday evening after sliding out of bed at home, no loss of consciousness,  no injury and no head injury was reported.  Workup reveals evidence of multifocal pneumonia, hypoxia.  He had been started on Augmentin  for 5 days on 5/7.  Patient was admitted for further evaluation and started on antibiotics for community-acquired pneumonia.  Assessment & Plan:   Principal Problem:   CAP (community acquired pneumonia) Active Problems:   PAF (paroxysmal atrial fibrillation) (HCC)  Acute hypoxic respiratory failure: Community-acquired pneumonia: Patient does not meet criteria for SIRS.   He was hypoxic requiring 3 L of supplemental oxygen on arrival. Continue supplemental oxygen and wean as tolerated. CT chest abdomen and pelvis shows multifocal pneumonia Continue empiric antibiotics ( ceftriaxone  and Zithromax  ). Lung sounds clear without any evidence of COPD exacerbation.   Acute kidney injury: > Resolved. Likely prerenal due to decreased intake last few days. Avoid nephrotoxic medications. Renal function improved with IV hydration.   History of CVA: continue Keppra  and Eliquis .   Hyperlipidemia: Continue Lipitor   COPD: He does not use oxygen at baseline,  currently no obvious evidence of acute exacerbation of COPD. Continue home inhalers.   GERD: Continue Protonix .   Hypokalemia: Replaced.  Continue to monitor.  Acute blood loss anemia: Hemoglobin dropped from 11.9>9.6 Repeat H&H remains normal at 11.1.  Paroxysmal A.Fibrillation: HR now controlled, converted into NSR on own. Cardiology  consulted.  Continue Eliquis  as he is already on for PE. If no recurrence would set up for event monitor to see if having other times.  DVT prophylaxis: Eliquis  Code Status: Full code Family Communication: DNR Disposition Plan:    Status is: Inpatient Remains inpatient appropriate because: Severity of illness.   Consultants:  Cardiology  Procedures: None  Antimicrobials: Anti-infectives (From admission, onward)    Start     Dose/Rate Route Frequency Ordered Stop   04/26/24 2200  azithromycin  (ZITHROMAX ) 500 mg in sodium chloride  0.9 % 250 mL IVPB        500 mg 250 mL/hr over 60 Minutes Intravenous Every 24 hours 04/26/24 0749 04/30/24 2159   04/26/24 2200  cefTRIAXone  (ROCEPHIN ) 1 g in sodium chloride  0.9 % 100 mL IVPB        1 g 200 mL/hr over 30 Minutes Intravenous Every 24 hours 04/26/24 0749 04/30/24 2159   04/26/24 0030  cefTRIAXone  (ROCEPHIN ) 1 g in sodium chloride  0.9 % 100 mL IVPB        1 g 200 mL/hr over 30 Minutes Intravenous  Once 04/26/24 0028 04/26/24 0153   04/26/24 0030  azithromycin  (ZITHROMAX ) 500 mg in sodium chloride  0.9 % 250 mL IVPB        500 mg 250 mL/hr over 60 Minutes Intravenous  Once 04/26/24 0028 04/26/24 0153      Subjective: Patient was seen and examined at bedside. Overnight events noted. Patient reports feeling better.  He denies any palpitations.  Patient had an episode of paroxysmal A-fib with RVR and hypotension which improved with fluid bolus.  He converted to normal sinus rhythm.  Objective: Vitals:   04/29/24 0012 04/29/24 0602 04/29/24  1008 04/29/24 1140  BP: (!) 97/56 126/71  96/61  Pulse: 79 84  90  Resp: 18 18  20   Temp: 97.7 F (36.5 C) 98.9 F (37.2 C)  99.8 F (37.7 C)  TempSrc: Oral Oral  Oral  SpO2: 92% 94% 93% 91%  Weight:      Height:        Intake/Output Summary (Last 24 hours) at 04/29/2024 1203 Last data filed at 04/29/2024 0200 Gross per 24 hour  Intake 1715.39 ml  Output 900 ml  Net 815.39 ml   Filed  Weights   04/26/24 1008  Weight: 90.9 kg    Examination:  General exam: Appears  comfortable, deconditioned, not in any acute distress. Respiratory system: CTA Bilaterally. Respiratory effort normal.  RR 16 Cardiovascular system: S1 & S2 heard, RRR. No JVD, murmurs, rubs, gallops or clicks.  Gastrointestinal system: Abdomen is non distended, soft and non tender.  Normal bowel sounds heard. Central nervous system: Alert and oriented x 3. No focal neurological deficits. Extremities: No edema, no cyanosis, no clubbing Skin: No rashes, lesions or ulcers Psychiatry: Judgement and insight appear normal. Mood & affect appropriate.     Data Reviewed: I have personally reviewed following labs and imaging studies  CBC: Recent Labs  Lab 04/23/24 1543 04/25/24 2042 04/25/24 2050 04/27/24 0904 04/27/24 1424 04/29/24 0545  WBC 13.1* 7.6  --  9.0  --  6.0  NEUTROABS 11.3* 6.5  --   --   --   --   HGB 12.3* 11.4* 11.9* 7.7* 9.6* 11.1*  HCT 36.2* 37.4* 35.0* 25.6* 30.8* 36.7*  MCV 89 95.2  --  99.6  --  95.6  PLT 163 156  --  158  --  181   Basic Metabolic Panel: Recent Labs  Lab 04/25/24 2042 04/25/24 2050 04/27/24 0904 04/29/24 0545  NA 141 141 138 138  K 3.6 3.7 3.3* 3.7  CL 104 105 103 98  CO2 26  --  25 29  GLUCOSE 181* 184* 140* 109*  BUN 23 22 10 10   CREATININE 1.74* 1.90* 0.85 0.76  CALCIUM  9.0  --  8.3* 8.6*  MG  --   --   --  2.1  PHOS  --   --   --  3.7   GFR: Estimated Creatinine Clearance: 103.7 mL/min (by C-G formula based on SCr of 0.76 mg/dL). Liver Function Tests: Recent Labs  Lab 04/25/24 2042  AST 38  ALT 30  ALKPHOS 73  BILITOT 1.5*  PROT 7.6  ALBUMIN  3.4*   No results for input(s): "LIPASE", "AMYLASE" in the last 168 hours. No results for input(s): "AMMONIA" in the last 168 hours. Coagulation Profile: Recent Labs  Lab 04/25/24 2042  INR 1.8*   Cardiac Enzymes: No results for input(s): "CKTOTAL", "CKMB", "CKMBINDEX", "TROPONINI" in the  last 168 hours. BNP (last 3 results) No results for input(s): "PROBNP" in the last 8760 hours. HbA1C: No results for input(s): "HGBA1C" in the last 72 hours. CBG: No results for input(s): "GLUCAP" in the last 168 hours. Lipid Profile: No results for input(s): "CHOL", "HDL", "LDLCALC", "TRIG", "CHOLHDL", "LDLDIRECT" in the last 72 hours. Thyroid Function Tests: No results for input(s): "TSH", "T4TOTAL", "FREET4", "T3FREE", "THYROIDAB" in the last 72 hours. Anemia Panel: No results for input(s): "VITAMINB12", "FOLATE", "FERRITIN", "TIBC", "IRON", "RETICCTPCT" in the last 72 hours. Sepsis Labs: Recent Labs  Lab 04/25/24 2049  LATICACIDVEN 1.7    Recent Results (from the past 240 hours)  Resp panel by  RT-PCR (RSV, Flu A&B, Covid) Anterior Nasal Swab     Status: None   Collection Time: 04/23/24  3:14 PM   Specimen: Anterior Nasal Swab  Result Value Ref Range Status   SARS Coronavirus 2 by RT PCR NEGATIVE NEGATIVE Final   Influenza A by PCR NEGATIVE NEGATIVE Final   Influenza B by PCR NEGATIVE NEGATIVE Final    Comment: (NOTE) The Xpert Xpress SARS-CoV-2/FLU/RSV plus assay is intended as an aid in the diagnosis of influenza from Nasopharyngeal swab specimens and should not be used as a sole basis for treatment. Nasal washings and aspirates are unacceptable for Xpert Xpress SARS-CoV-2/FLU/RSV testing.  Fact Sheet for Patients: BloggerCourse.com  Fact Sheet for Healthcare Providers: SeriousBroker.it  This test is not yet approved or cleared by the [place]  FDA and has been authorized for detection and/or diagnosis of SARS-CoV-2 by FDA under an Emergency Use Authorization (EUA). This EUA will remain in effect (meaning this test can be used) for the duration of the COVID-19 declaration under Section 564(b)(1) of the Act, 21 U.S.C. section 360bbb-3(b)(1), unless the authorization is terminated or revoked.     Resp Syncytial  Virus by PCR NEGATIVE NEGATIVE Final    Comment: (NOTE) Fact Sheet for Patients: BloggerCourse.com  Fact Sheet for Healthcare Providers: SeriousBroker.it  This test is not yet approved or cleared by the [place]  FDA and has been authorized for detection and/or diagnosis of SARS-CoV-2 by FDA under an Emergency Use Authorization (EUA). This EUA will remain in effect (meaning this test can be used) for the duration of the COVID-19 declaration under Section 564(b)(1) of the Act, 21 U.S.C. section 360bbb-3(b)(1), unless the authorization is terminated or revoked.  Performed at [facility], [address], [place], [place] [address]   Resp panel by RT-PCR (RSV, Flu A&B, Covid) Anterior Nasal Swab     Status: None   Collection Time: 04/25/24  8:33 PM   Specimen: Anterior Nasal Swab  Result Value Ref Range Status   SARS Coronavirus 2 by RT PCR NEGATIVE NEGATIVE Final    Comment: (NOTE) SARS-CoV-2 target nucleic acids are NOT DETECTED.  The SARS-CoV-2 RNA is generally detectable in upper respiratory specimens during the acute phase of infection. The lowest concentration of SARS-CoV-2 viral copies this assay can detect is 138 copies/mL. A negative result does not preclude SARS-Cov-2 infection and should not be used as the sole basis for treatment or other patient management decisions. A negative result may occur with  improper specimen collection/handling, submission of specimen other than nasopharyngeal swab, presence of viral mutation(s) within the areas targeted by this assay, and inadequate number of viral copies(<138 copies/mL). A negative result must be combined with clinical observations, patient history, and epidemiological information. The expected result is Negative.  Fact Sheet for Patients:  BloggerCourse.com  Fact Sheet for Healthcare Providers:   SeriousBroker.it  This test is no t yet approved or cleared by the [place]  FDA and  has been authorized for detection and/or diagnosis of SARS-CoV-2 by FDA under an Emergency Use Authorization (EUA). This EUA will remain  in effect (meaning this test can be used) for the duration of the COVID-19 declaration under Section 564(b)(1) of the Act, 21 U.S.C.section 360bbb-3(b)(1), unless the authorization is terminated  or revoked sooner.       Influenza A by PCR NEGATIVE NEGATIVE Final   Influenza B by PCR NEGATIVE NEGATIVE Final    Comment: (NOTE) The Xpert Xpress SARS-CoV-2/FLU/RSV plus assay is intended as an aid  in the diagnosis of influenza from Nasopharyngeal swab specimens and should not be used as a sole basis for treatment. Nasal washings and aspirates are unacceptable for Xpert Xpress SARS-CoV-2/FLU/RSV testing.  Fact Sheet for Patients: BloggerCourse.com  Fact Sheet for Healthcare Providers: SeriousBroker.it  This test is not yet approved or cleared by the [place]  FDA and has been authorized for detection and/or diagnosis of SARS-CoV-2 by FDA under an Emergency Use Authorization (EUA). This EUA will remain in effect (meaning this test can be used) for the duration of the COVID-19 declaration under Section 564(b)(1) of the Act, 21 U.S.C. section 360bbb-3(b)(1), unless the authorization is terminated or revoked.     Resp Syncytial Virus by PCR NEGATIVE NEGATIVE Final    Comment: (NOTE) Fact Sheet for Patients: BloggerCourse.com  Fact Sheet for Healthcare Providers: SeriousBroker.it  This test is not yet approved or cleared by the [place]  FDA and has been authorized for detection and/or diagnosis of SARS-CoV-2 by FDA under an Emergency Use Authorization (EUA). This EUA will remain in effect (meaning this test can be used) for  the duration of the COVID-19 declaration under Section 564(b)(1) of the Act, 21 U.S.C. section 360bbb-3(b)(1), unless the authorization is terminated or revoked.  Performed at [facility], [address], [place], [place] [address]   Blood Culture (routine x 2)     Status: None (Preliminary result)   Collection Time: 04/25/24  8:42 PM   Specimen: BLOOD  Result Value Ref Range Status   Specimen Description   Final    BLOOD LEFT ANTECUBITAL Performed at [facility], [address] [place], [place] [address]    Special Requests   Final    BOTTLES DRAWN AEROBIC AND ANAEROBIC Blood Culture adequate volume Performed at [facility], [address] [place], [place] [address]    Culture   Final    NO GROWTH 4 DAYS Performed at [facility], [address] [place], [place] [address]    Report Status PENDING  Incomplete  Blood Culture (routine x 2)     Status: None (Preliminary result)   Collection Time: 04/25/24  8:42 PM   Specimen: BLOOD LEFT FOREARM  Result Value Ref Range Status   Specimen Description   Final    BLOOD LEFT FOREARM Performed at [facility], [address] [place], [place] [address]    Special Requests   Final    BOTTLES DRAWN AEROBIC AND ANAEROBIC Blood Culture adequate volume Performed at [facility], [address] [place], [place] [address]    Culture   Final    NO GROWTH 4 DAYS Performed at [facility], [address], [place], [place] [address]    Report Status PENDING  Incomplete    Radiology Studies: No results found.  Scheduled Meds:  apixaban   5 mg Oral BID   arformoterol   15 mcg Nebulization BID   And   umeclidinium bromide   1 puff Inhalation Daily   atorvastatin   40 mg Oral Daily   dextromethorphan-guaiFENesin   2 tablet Oral BID   feeding supplement  237 mL Oral BID BM   levETIRAcetam   500 mg Oral BID   pantoprazole   40 mg Oral Daily    Continuous Infusions:  azithromycin  Stopped (04/28/24 2323)   cefTRIAXone  (ROCEPHIN )  IV Stopped (04/28/24 2207)     LOS: 3 days    Time spent: 35 mins    Magdalene School, MD [facility]  If 7PM-7AM, please contact night-coverage

## 2024-04-29 NOTE — Plan of Care (Signed)

## 2024-04-29 NOTE — Plan of Care (Signed)

## 2024-04-30 DIAGNOSIS — J189 Pneumonia, unspecified organism: Secondary | ICD-10-CM | POA: Diagnosis not present

## 2024-04-30 LAB — CULTURE, BLOOD (ROUTINE X 2)
Culture: NO GROWTH
Culture: NO GROWTH
Special Requests: ADEQUATE
Special Requests: ADEQUATE

## 2024-04-30 NOTE — Progress Notes (Signed)
 PROGRESS NOTE    Todd Estrada  GNF:[id number] DOB: 1961-09-01 DOA: 04/25/2024 PCP: Dorthy Gavia, MD   Brief Narrative: This 63 yrs old male with medical history significant for heart failure with preserved EF, prior CVA, PAD being admitted to the hospital with several days of cough and congestion and  found to have community-acquired pneumonia.  He was initially brought to the ER yesterday evening after sliding out of bed at home, no loss of consciousness,  no injury and no head injury was reported.  Workup reveals evidence of multifocal pneumonia, hypoxia.  He had been started on Augmentin  for 5 days on 5/7.  Patient was admitted for further evaluation and started on antibiotics for community-acquired pneumonia.  Assessment & Plan:   Principal Problem:   CAP (community acquired pneumonia) Active Problems:   Hypotension   PAF (paroxysmal atrial fibrillation) (HCC)   Coronary artery calcification of native artery   Acute respiratory failure with hypoxia (HCC)   History of stroke  Acute hypoxic respiratory failure: Community-acquired pneumonia: Patient does not meet criteria for SIRS.   He was hypoxic requiring 3 L of supplemental oxygen on arrival. Continue supplemental oxygen and wean as tolerated. CT chest abdomen and pelvis shows multifocal pneumonia Continue empiric antibiotics ( ceftriaxone  and Zithromax  ). Lung sounds clear without any evidence of COPD exacerbation. Check oxygen while ambulation to see if he qualifies for home oxygen.   Acute kidney injury: > Resolved. Likely prerenal due to decreased intake last few days. Avoid nephrotoxic medications. Renal function improved with IV hydration.   History of CVA: continue Keppra  and Eliquis .   Hyperlipidemia: Continue Lipitor   COPD: He does not use oxygen at baseline,  currently no obvious evidence of acute exacerbation of COPD. Continue home inhalers.   GERD: Continue Protonix .   Hypokalemia: Replaced.   Continue to monitor.  Acute blood loss anemia: Hemoglobin dropped from 11.9>9.6 Repeat H&H remains normal at 11.1.  Paroxysmal A.Fibrillation: HR now controlled, converted into NSR on own. Cardiology consulted.  Continue Eliquis  as he is already on for PE. If no recurrence would set up for event monitor to see if having other times.  DVT prophylaxis: Eliquis  Code Status: Full code Family Communication: DNR Disposition Plan:    Status is: Inpatient Remains inpatient appropriate because: Severity of illness. Patient is now medically clear , awaiting SNF placement.   Consultants:  Cardiology  Procedures: None  Antimicrobials: Anti-infectives (From admission, onward)    Start     Dose/Rate Route Frequency Ordered Stop   04/26/24 2200  azithromycin  (ZITHROMAX ) 500 mg in sodium chloride  0.9 % 250 mL IVPB        500 mg 250 mL/hr over 60 Minutes Intravenous Every 24 hours 04/26/24 0749 04/29/24 2328   04/26/24 2200  cefTRIAXone  (ROCEPHIN ) 1 g in sodium chloride  0.9 % 100 mL IVPB        1 g 200 mL/hr over 30 Minutes Intravenous Every 24 hours 04/26/24 0749 04/29/24 2201   04/26/24 0030  cefTRIAXone  (ROCEPHIN ) 1 g in sodium chloride  0.9 % 100 mL IVPB        1 g 200 mL/hr over 30 Minutes Intravenous  Once 04/26/24 0028 04/26/24 0153   04/26/24 0030  azithromycin  (ZITHROMAX ) 500 mg in sodium chloride  0.9 % 250 mL IVPB        500 mg 250 mL/hr over 60 Minutes Intravenous  Once 04/26/24 0028 04/26/24 0153      Subjective: Patient was seen and examined at bedside. Overnight  events noted. He reports feeling much improved, denies any palpitations.   Still remains on 4 L of oxygen.  Objective: Vitals:   04/29/24 2158 04/30/24 0624 04/30/24 0948 04/30/24 0952  BP: 102/63 110/64    Pulse: 95 86    Resp:  16    Temp: 98 F (36.7 C) 99 F (37.2 C)    TempSrc: Oral Oral    SpO2: 98% 93% (!) 89% 93%  Weight:      Height:        Intake/Output Summary (Last 24 hours) at 04/30/2024  1343 Last data filed at 04/30/2024 0900 Gross per 24 hour  Intake 220 ml  Output 2025 ml  Net -1805 ml   Filed Weights   04/26/24 1008  Weight: 90.9 kg    Examination:  General exam: Appears  comfortable, deconditioned, not in any acute distress. Respiratory system: CTA Bilaterally. Respiratory effort normal.  RR 15 Cardiovascular system: S1 & S2 heard, RRR. No JVD, murmurs, rubs, gallops or clicks.  Gastrointestinal system: Abdomen is non distended, soft and non tender.  Normal bowel sounds heard. Central nervous system: Alert and oriented x 3. No focal neurological deficits. Extremities: No edema, no cyanosis, no clubbing Skin: No rashes, lesions or ulcers Psychiatry: Judgement and insight appear normal. Mood & affect appropriate.     Data Reviewed: I have personally reviewed following labs and imaging studies  CBC: Recent Labs  Lab 04/23/24 1543 04/25/24 2042 04/25/24 2050 04/27/24 0904 04/27/24 1424 04/29/24 0545  WBC 13.1* 7.6  --  9.0  --  6.0  NEUTROABS 11.3* 6.5  --   --   --   --   HGB 12.3* 11.4* 11.9* 7.7* 9.6* 11.1*  HCT 36.2* 37.4* 35.0* 25.6* 30.8* 36.7*  MCV 89 95.2  --  99.6  --  95.6  PLT 163 156  --  158  --  181   Basic Metabolic Panel: Recent Labs  Lab 04/25/24 2042 04/25/24 2050 04/27/24 0904 04/29/24 0545  NA 141 141 138 138  K 3.6 3.7 3.3* 3.7  CL 104 105 103 98  CO2 26  --  25 29  GLUCOSE 181* 184* 140* 109*  BUN 23 22 10 10   CREATININE 1.74* 1.90* 0.85 0.76  CALCIUM  9.0  --  8.3* 8.6*  MG  --   --   --  2.1  PHOS  --   --   --  3.7   GFR: Estimated Creatinine Clearance: 103.7 mL/min (by C-G formula based on SCr of 0.76 mg/dL). Liver Function Tests: Recent Labs  Lab 04/25/24 2042  AST 38  ALT 30  ALKPHOS 73  BILITOT 1.5*  PROT 7.6  ALBUMIN  3.4*   No results for input(s): "LIPASE", "AMYLASE" in the last 168 hours. No results for input(s): "AMMONIA" in the last 168 hours. Coagulation Profile: Recent Labs  Lab  04/25/24 2042  INR 1.8*   Cardiac Enzymes: No results for input(s): "CKTOTAL", "CKMB", "CKMBINDEX", "TROPONINI" in the last 168 hours. BNP (last 3 results) No results for input(s): "PROBNP" in the last 8760 hours. HbA1C: No results for input(s): "HGBA1C" in the last 72 hours. CBG: No results for input(s): "GLUCAP" in the last 168 hours. Lipid Profile: No results for input(s): "CHOL", "HDL", "LDLCALC", "TRIG", "CHOLHDL", "LDLDIRECT" in the last 72 hours. Thyroid Function Tests: No results for input(s): "TSH", "T4TOTAL", "FREET4", "T3FREE", "THYROIDAB" in the last 72 hours. Anemia Panel: No results for input(s): "VITAMINB12", "FOLATE", "FERRITIN", "TIBC", "IRON", "RETICCTPCT" in the last 72  hours. Sepsis Labs: Recent Labs  Lab 04/25/24 2049  LATICACIDVEN 1.7    Recent Results (from the past 240 hours)  Resp panel by RT-PCR (RSV, Flu A&B, Covid) Anterior Nasal Swab     Status: None   Collection Time: 04/23/24  3:14 PM   Specimen: Anterior Nasal Swab  Result Value Ref Range Status   SARS Coronavirus 2 by RT PCR NEGATIVE NEGATIVE Final   Influenza A by PCR NEGATIVE NEGATIVE Final   Influenza B by PCR NEGATIVE NEGATIVE Final    Comment: (NOTE) The Xpert Xpress SARS-CoV-2/FLU/RSV plus assay is intended as an aid in the diagnosis of influenza from Nasopharyngeal swab specimens and should not be used as a sole basis for treatment. Nasal washings and aspirates are unacceptable for Xpert Xpress SARS-CoV-2/FLU/RSV testing.  Fact Sheet for Patients: BloggerCourse.com  Fact Sheet for Healthcare Providers: SeriousBroker.it  This test is not yet approved or cleared by the [place]  FDA and has been authorized for detection and/or diagnosis of SARS-CoV-2 by FDA under an Emergency Use Authorization (EUA). This EUA will remain in effect (meaning this test can be used) for the duration of the COVID-19 declaration under Section  564(b)(1) of the Act, 21 U.S.C. section 360bbb-3(b)(1), unless the authorization is terminated or revoked.     Resp Syncytial Virus by PCR NEGATIVE NEGATIVE Final    Comment: (NOTE) Fact Sheet for Patients: BloggerCourse.com  Fact Sheet for Healthcare Providers: SeriousBroker.it  This test is not yet approved or cleared by the [place]  FDA and has been authorized for detection and/or diagnosis of SARS-CoV-2 by FDA under an Emergency Use Authorization (EUA). This EUA will remain in effect (meaning this test can be used) for the duration of the COVID-19 declaration under Section 564(b)(1) of the Act, 21 U.S.C. section 360bbb-3(b)(1), unless the authorization is terminated or revoked.  Performed at [facility], [address], [place], [place] [address]   Resp panel by RT-PCR (RSV, Flu A&B, Covid) Anterior Nasal Swab     Status: None   Collection Time: 04/25/24  8:33 PM   Specimen: Anterior Nasal Swab  Result Value Ref Range Status   SARS Coronavirus 2 by RT PCR NEGATIVE NEGATIVE Final    Comment: (NOTE) SARS-CoV-2 target nucleic acids are NOT DETECTED.  The SARS-CoV-2 RNA is generally detectable in upper respiratory specimens during the acute phase of infection. The lowest concentration of SARS-CoV-2 viral copies this assay can detect is 138 copies/mL. A negative result does not preclude SARS-Cov-2 infection and should not be used as the sole basis for treatment or other patient management decisions. A negative result may occur with  improper specimen collection/handling, submission of specimen other than nasopharyngeal swab, presence of viral mutation(s) within the areas targeted by this assay, and inadequate number of viral copies(<138 copies/mL). A negative result must be combined with clinical observations, patient history, and epidemiological information. The expected result is Negative.  Fact Sheet for  Patients:  BloggerCourse.com  Fact Sheet for Healthcare Providers:  SeriousBroker.it  This test is no t yet approved or cleared by the [place]  FDA and  has been authorized for detection and/or diagnosis of SARS-CoV-2 by FDA under an Emergency Use Authorization (EUA). This EUA will remain  in effect (meaning this test can be used) for the duration of the COVID-19 declaration under Section 564(b)(1) of the Act, 21 U.S.C.section 360bbb-3(b)(1), unless the authorization is terminated  or revoked sooner.       Influenza A by PCR NEGATIVE NEGATIVE  Final   Influenza B by PCR NEGATIVE NEGATIVE Final    Comment: (NOTE) The Xpert Xpress SARS-CoV-2/FLU/RSV plus assay is intended as an aid in the diagnosis of influenza from Nasopharyngeal swab specimens and should not be used as a sole basis for treatment. Nasal washings and aspirates are unacceptable for Xpert Xpress SARS-CoV-2/FLU/RSV testing.  Fact Sheet for Patients: BloggerCourse.com  Fact Sheet for Healthcare Providers: SeriousBroker.it  This test is not yet approved or cleared by the [place]  FDA and has been authorized for detection and/or diagnosis of SARS-CoV-2 by FDA under an Emergency Use Authorization (EUA). This EUA will remain in effect (meaning this test can be used) for the duration of the COVID-19 declaration under Section 564(b)(1) of the Act, 21 U.S.C. section 360bbb-3(b)(1), unless the authorization is terminated or revoked.     Resp Syncytial Virus by PCR NEGATIVE NEGATIVE Final    Comment: (NOTE) Fact Sheet for Patients: BloggerCourse.com  Fact Sheet for Healthcare Providers: SeriousBroker.it  This test is not yet approved or cleared by the [place]  FDA and has been authorized for detection and/or diagnosis of SARS-CoV-2 by FDA under an Emergency  Use Authorization (EUA). This EUA will remain in effect (meaning this test can be used) for the duration of the COVID-19 declaration under Section 564(b)(1) of the Act, 21 U.S.C. section 360bbb-3(b)(1), unless the authorization is terminated or revoked.  Performed at [facility], [address], [place], [place] [address]   Blood Culture (routine x 2)     Status: None   Collection Time: 04/25/24  8:42 PM   Specimen: BLOOD  Result Value Ref Range Status   Specimen Description   Final    BLOOD LEFT ANTECUBITAL Performed at [facility], [address], [place], [place] [address]    Special Requests   Final    BOTTLES DRAWN AEROBIC AND ANAEROBIC Blood Culture adequate volume Performed at [facility], [address], [place], [place] [address]    Culture   Final    NO GROWTH 5 DAYS Performed at [facility], [address], [place], [place] [address]    Report Status 04/30/2024 FINAL  Final  Blood Culture (routine x 2)     Status: None   Collection Time: 04/25/24  8:42 PM   Specimen: BLOOD LEFT FOREARM  Result Value Ref Range Status   Specimen Description   Final    BLOOD LEFT FOREARM Performed at [facility], [address] [place], [place] [address]    Special Requests   Final    BOTTLES DRAWN AEROBIC AND ANAEROBIC Blood Culture adequate volume Performed at [facility], [address] [place], [place] [address]    Culture   Final    NO GROWTH 5 DAYS Performed at [facility], [address] [place], [place] [address]    Report Status 04/30/2024 FINAL  Final    Radiology Studies: No results found.  Scheduled Meds:  apixaban   5 mg Oral BID   arformoterol   15 mcg Nebulization BID   And   umeclidinium bromide   1 puff Inhalation Daily   atorvastatin   40 mg Oral Daily   dextromethorphan-guaiFENesin   2 tablet Oral BID   feeding supplement  237 mL Oral BID BM    levETIRAcetam   500 mg Oral BID   pantoprazole   40 mg Oral Daily   Continuous Infusions:     LOS: 4 days    Time spent:  35 mins    Magdalene School, MD [facility]   If 7PM-7AM, please contact night-coverage

## 2024-04-30 NOTE — Progress Notes (Addendum)
 Physical Therapy Evaluation Patient Details Name: Todd Estrada MRN: [id number] DOB: 06-29-1961 Today's Date: 04/30/2024  History of Present Illness  63 yo male presents to therapy following hospital admission on 04/25/2024 due to CAP and fall OOB. Pt PMH includes but is not limited to:  PNA, COPD, CHF, CVA with aphasia and hemiplegia, lung SCC stage IV, and seizures.  Clinical Impression   Pt admitted with above diagnosis.  Pt currently with functional limitations due to the deficits listed below (see PT Problem List). Pt semi reclined in bed when PT arrived, pt agreeable to therapy intervention, pt appears to be a poor historian and presents with expressive dysphagia and demonstrated limited verbal communication during evaluation. Pt required min/mod A to roll to the R, mod A for supine to sit with A for trunk with use of hospital bed and R LE, pt required min A for sit to supine, pt able to maintain sitting balance EOB with close S and min cues with facilitation for B LE on floor and L UE support, once returning to supine pt able to use hospital bed to scoot toward [facility]. Pt denies pain. Pt indicated feeling like PR elevated and 105 seated EOB with O2 saturation 88-94% on 4 L/min. Pt declined transfers and left in bed with all needs met.  Patient will benefit from continued inpatient follow up therapy, <3 hours/day at time of d/c. Pt will benefit from acute skilled PT to increase their independence and safety with mobility to allow discharge.         If plan is discharge home, recommend the following: Two people to help with walking and/or transfers;A lot of help with bathing/dressing/bathroom;Assistance with cooking/housework;Assistance with feeding;Direct supervision/assist for medications management;Direct supervision/assist for financial management;Assist for transportation;Help with stairs or ramp for entrance   Can travel by private vehicle   No    Equipment Recommendations None recommended by  PT  Recommendations for Other Services       Functional Status Assessment Patient has had a recent decline in their functional status and demonstrates the ability to make significant improvements in function in a reasonable and predictable amount of time.     Precautions / Restrictions Precautions Precautions: Fall;Other (comment) (supplemental O2 and desaturates with exertion) Restrictions Weight Bearing Restrictions Per Provider Order: No      Mobility  Bed Mobility Overal bed mobility: Needs Assistance Bed Mobility: Rolling, Supine to Sit, Sit to Supine Rolling: Min assist, Used rails   Supine to sit: Mod assist, Used rails, HOB elevated Sit to supine: Min assist, Used rails (flat surface A for R LE)   General bed mobility comments: increased time for all motor processing and planning with A for R LE to EOB and then to return to bed, pt able to scoot in supine toward [facility] with use of bed rails and cues    Transfers                   General transfer comment: NT pt declined to get OOB at time of eval    Ambulation/Gait               General Gait Details: non ambulatory at baseline  Stairs            Wheelchair Mobility     Tilt Bed    Modified Rankin (Stroke Patients Only)       Balance Overall balance assessment: Needs assistance, History of Falls Sitting-balance support: Feet supported, Single extremity supported Sitting  balance-Leahy Scale: Fair                                       Pertinent Vitals/Pain Pain Assessment Pain Assessment: No/denies pain    Home Living Family/patient expects to be discharged to:: Private residence Living Arrangements: Other relatives Available Help at Discharge: Family;Available PRN/intermittently Type of Home: House Home Access: Stairs to enter   [employer] of Steps: 1   Home Layout: One level Home Equipment: BSC/3in1;Wheelchair - manual;Tub bench Additional  Comments: no family present, pt has expressive aphasia and reports "yes" when asked if he lives with family PLOF obtained from prior hospitalizations    Prior Function Prior Level of Function : Patient poor historian/Family not available             Mobility Comments: via yes/no responses pt reports he uses a wheelchair and he transfers with one person assist (no device) ADLs Comments: pt reports assist from family; level of assist uncertain     Extremity/Trunk Assessment   Upper Extremity Assessment Upper Extremity Assessment: Defer to OT evaluation    Lower Extremity Assessment Lower Extremity Assessment: RLE deficits/detail RLE Deficits / Details: pt has hx of CVA with late effects impacting R UE and LE, R LE grossly 2/5 with pt able to inititate voluntary movement with cues RLE Sensation: decreased proprioception RLE Coordination: decreased gross motor;decreased fine motor       Communication   Communication Communication: Impaired Factors Affecting Communication: Other (comment);Reduced clarity of speech (hx of CVA)    Cognition Arousal: Alert Behavior During Therapy: Flat affect   PT - Cognitive impairments: History of cognitive impairments, No family/caregiver present to determine baseline                       PT - Cognition Comments: hx of CVA with expressive aphasia Following commands: Impaired Following commands impaired: Only follows one step commands consistently     Cueing Cueing Techniques: Verbal cues, Gestural cues, Tactile cues, Visual cues     General Comments      Exercises     Assessment/Plan    PT Assessment Patient needs continued PT services  PT Problem List Decreased strength;Decreased range of motion;Decreased activity tolerance;Decreased balance;Decreased mobility;Decreased coordination;Decreased safety awareness;Cardiopulmonary status limiting activity;Impaired tone       PT Treatment Interventions DME  instruction;Functional mobility training;Therapeutic activities;Therapeutic exercise;Balance training;Neuromuscular re-education;Patient/family education;Wheelchair mobility training    PT Goals (Current goals can be found in the Care Plan section)  Acute Rehab PT Goals PT Goal Formulation: Patient unable to participate in goal setting    Frequency Min 2X/week     Co-evaluation               AM-PAC PT "6 Clicks" Mobility  Outcome Measure Help needed turning from your back to your side while in a flat bed without using bedrails?: A Lot Help needed moving from lying on your back to sitting on the side of a flat bed without using bedrails?: A Lot Help needed moving to and from a bed to a chair (including a wheelchair)?: Total Help needed standing up from a chair using your arms (e.g., wheelchair or bedside chair)?: Total Help needed to walk in hospital room?: Total Help needed climbing 3-5 steps with a railing? : Total 6 Click Score: 8    End of Session Equipment Utilized During Treatment: Oxygen Activity Tolerance:  Patient limited by fatigue;Other (comment) (desaturation with exertion to 88% on 4 L/min requiring 45s with cues for pursed lip breathing to recover to 90%) Patient left: in bed;with call bell/phone within reach Nurse Communication: Mobility status PT Visit Diagnosis: Unsteadiness on feet (R26.81);Muscle weakness (generalized) (M62.81);History of falling (Z91.81);Difficulty in walking, not elsewhere classified (R26.2);Hemiplegia and hemiparesis Hemiplegia - Right/Left: Right Hemiplegia - caused by: Cerebral infarction    Time: 1012-1035 PT Time Calculation (min) (ACUTE ONLY): 23 min   Charges:   PT Evaluation $PT Eval Low Complexity: 1 Low PT Treatments $Therapeutic Activity: 8-22 mins PT General Charges $$ ACUTE PT VISIT: 1 Visit         Cary Clarks, PT Acute Rehab   Annalee Kiang 04/30/2024, 11:52 AM

## 2024-04-30 NOTE — Plan of Care (Signed)
   Problem: Clinical Measurements: Goal: Ability to maintain clinical measurements within normal limits will improve Outcome: Progressing Goal: Will remain free from infection Outcome: Progressing Goal: Diagnostic test results will improve Outcome: Progressing Goal: Respiratory complications will improve Outcome: Progressing Goal: Cardiovascular complication will be avoided Outcome: Progressing   Problem: Nutrition: Goal: Adequate nutrition will be maintained Outcome: Progressing   Problem: Elimination: Goal: Will not experience complications related to bowel motility Outcome: Progressing Goal: Will not experience complications related to urinary retention Outcome: Progressing   Problem: Safety: Goal: Ability to remain free from injury will improve Outcome: Progressing

## 2024-05-01 DIAGNOSIS — J189 Pneumonia, unspecified organism: Secondary | ICD-10-CM | POA: Diagnosis not present

## 2024-05-01 NOTE — Plan of Care (Signed)

## 2024-05-01 NOTE — TOC Initial Note (Incomplete)
 Transition of Care [facility]) - Initial/Assessment Note    Patient Details  Name: Todd Estrada MRN: [id number] Date of Birth: March 17, 1961  Transition [facility]) CM/SW Contact:    Loreda Rodriguez, RN Phone Number: 05/01/2024, 12:20 PM  Clinical Narrative:                   Expected Discharge Plan: Skilled Nursing Facility Barriers to Discharge: SNF Pending bed offer   Patient Goals and CMS Choice Patient states their goals for this hospitalization and ongoing recovery are:: rehab CMS Medicare.gov Compare Post Acute Care list provided to:: Patient Represenative (must comment) (cousin [name] per patient request) Choice offered to / list presented to :  (cousin Mase Cassani) [place] ownership interest in [facility].provided to:: Patient    Expected Discharge Plan and Services In-house Referral: NA Discharge Planning Services: CM Consult Post Acute Care Choice: Skilled Nursing Facility Living arrangements for the past 2 months: Single Family Home                 DME Arranged: N/A DME Agency: NA       HH Arranged: NA HH Agency: NA        Prior Living Arrangements/Services Living arrangements for the past 2 months: Single Family Home Lives with:: Relatives Patient language and need for interpreter reviewed:: Yes Do you feel safe going back to the place where you live?: Yes      Need for Family Participation in Patient Care: Yes (Comment) Care giver support system in place?: Yes (comment) Current home services: DME (wheelchair) Criminal Activity/Legal Involvement Pertinent to Current Situation/Hospitalization: No - Comment as needed  Activities of Daily Living   ADL Screening (condition at time of admission) Independently performs ADLs?: No Does the patient have a NEW difficulty with bathing/dressing/toileting/self-feeding that is expected to last >3 days?: No Does the patient have a NEW difficulty with getting in/out of bed, walking, or climbing stairs  that is expected to last >3 days?: No Does the patient have a NEW difficulty with communication that is expected to last >3 days?: No Is the patient deaf or have difficulty hearing?: No Does the patient have difficulty seeing, even when wearing glasses/contacts?: No Does the patient have difficulty concentrating, remembering, or making decisions?: No  Permission Sought/Granted Permission sought to share information with : Family Supports Permission granted to share information with : Yes, Verbal Permission Granted  Share Information with NAME: Ericson Ebron     Permission granted to share info w Relationship: cousin  Permission granted to share info w Contact Information: [phone]  Emotional Assessment Appearance:: Appears older than stated age Attitude/Demeanor/Rapport: Unable to Assess Affect (typically observed): Unable to Assess Orientation: : Oriented to Self, Oriented to Place Alcohol / Substance Use: Not Applicable Psych Involvement: No (comment)  Admission diagnosis:  Weakness [R53.1] CAP (community acquired pneumonia) [J18.9] AKI (acute kidney injury) (HCC) [N17.9] Multifocal pneumonia [J18.9] Patient Active Problem List   Diagnosis Date Noted   Coronary artery calcification of native artery 04/29/2024   Acute respiratory failure with hypoxia (HCC) 04/29/2024   History of stroke 04/29/2024   PAF (paroxysmal atrial fibrillation) (HCC) 04/28/2024   CAP (community acquired pneumonia) 04/26/2024   Acute cough 04/23/2024   Edema 04/23/2024   CVA (cerebral vascular accident) (HCC) 01/29/2024   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 07/07/2023   Acute gangrenous cholecystitis 07/04/2023   Right upper lobe pneumonia 04/13/2023   Buttock wound, right, subsequent encounter 03/01/2023   PAD (peripheral  artery disease) (HCC) 01/06/2023   Hypotension 01/06/2023   History of pulmonary embolism 10/12/2022   Cervical transverse process fracture (HCC) 09/10/2022   Anemia of chronic  disease 08/13/2022   Chronic heart failure with mildly reduced ejection fraction (HFmrEF) (HCC) 08/13/2022   Necrotizing pneumonia (HCC) 08/12/2022   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) 06/24/2022   Encounter for antineoplastic chemotherapy 06/24/2022   Seizure (HCC) 06/02/2022   Squamous cell lung cancer (HCC) 05/10/2022   Emphysema lung (HCC) 04/14/2022   Dyslipidemia    Dysphagia, post-stroke    Internal carotid artery occlusion, left 03/03/2022   Malnutrition of moderate degree 03/03/2022   Tobacco use disorder 08/19/2021   PCP:  Dorthy Gavia, MD Pharmacy:   [id number] Jonette Nestle, [place] - [address] AT [facility] OF [facility] [address] [place] [address] Phone: [phone] Fax: [phone]     Social Drivers of Health (SDOH) Social History: SDOH Screenings   Food Insecurity: No Food Insecurity (04/26/2024)  Housing: Low Risk  (04/26/2024)  Transportation Needs: No Transportation Needs (04/26/2024)  Utilities: Not At Risk (04/26/2024)  Depression (PHQ2-9): Low Risk  (09/20/2023)  Social Connections: Socially Isolated (04/26/2024)  Tobacco Use: Medium Risk (04/25/2024)   SDOH Interventions:     Readmission Risk Interventions    05/01/2024   12:09 PM 07/25/2023   12:32 PM 07/04/2023    3:54 PM  Readmission Risk Prevention Plan  Transportation Screening Complete Complete Complete  Medication Review Oceanographer) Complete Complete Complete  PCP or Specialist appointment within 3-5 days of discharge Complete  Complete  HRI or Home Care Consult Complete Complete Complete  SW Recovery Care/Counseling Consult Complete Complete Complete  Palliative Care Screening Not Applicable Not Applicable Not Applicable  Skilled Nursing Facility Complete Not Applicable Not Applicable

## 2024-05-01 NOTE — Progress Notes (Signed)
 PROGRESS NOTE  Todd Estrada  DOB: May 13, 1961  PCP: Dorthy Gavia, MD ZOX:[id number]  DOA: 04/25/2024  LOS: 5 days  Hospital Day: 7  Brief narrative: Todd Estrada is a 63 y.o. male with PMH significant for HTN, HLD, diastolic CHF, paroxysmal A-fib on Eliquis , prior CVA with aphasia and hemiplegia, seizure, PAD, COPD, stage IV lung cancer.  5/8, patient was brought to the ED from home with complaint of several days of cough, congestion, weakness.  In the ED, patient was hypoxic and required 3 L oxygen by nasal cannula CT scan showed bibasilar pneumonia L>R Started on IV antibiotics Admitted to [facility] 5/11, patient had A-fib with RVR.  Cardiology was consulted  Subjective: Patient was seen and examined this afternoon. Looks older for his age.  Not in pain.  Alert, awake, dysarthric.  Hard to understand what he is trying to communicate.  No family at bedside. Chart reviewed Tmax 100.1 yesterday afternoon Currently on 4 L oxygen by nasal cannula Last set of labs from 5/12.  Assessment and plan: Community-acquired pneumonia Acute hypoxic respiratory failure H/o COPD Presented with cough, congestion, weakness, hypoxia CT scan showed bibasilar pneumonia L>R Currently improving IV antibiotics.  Not requiring steroids. Continue inhalers. Currently requiring 4 L oxygen by nasal cannula.  Wean down as tolerated Recent Labs  Lab 04/25/24 2042 04/25/24 2049 04/27/24 0904 04/29/24 0545  WBC 7.6  --  9.0 6.0  LATICACIDVEN  --  1.7  --   --    Acute kidney injury Likely prerenal due to decreased intake last few days. Renal function improved with IV fluid. Recent Labs    10/17/23 1944 10/31/23 0905 11/08/23 0430 11/16/23 0117 12/01/23 0156 12/10/23 0758 01/12/24 1815 04/01/24 0601 04/25/24 2042 04/25/24 2050 04/27/24 0904 04/29/24 0545  BUN 21 15 18 14 18 16 20 22 23 22 10 10   CREATININE 0.78 0.76 0.90 0.91 0.97 0.80 0.86 1.02 1.74* 1.90* 0.85 0.76  CO2 27 27 28 26 28    --  29 27 26   --  25 29   Hypokalemia Potassium level improved with replacement. Recent Labs  Lab 04/25/24 2042 04/25/24 2050 04/27/24 0904 04/29/24 0545  K 3.6 3.7 3.3* 3.7  MG  --   --   --  2.1  PHOS  --   --   --  3.7   A-fib with RVR Paroxysmal A-fib On 5/11, patient had A-fib with RVR.  Cardiology was consulted.  Heart rate self converted to normal sinus rhythm.   Not on any AV nodal blocking agent due to soft blood pressure Continue anticoagulation with Eliquis   Chronic hypOtension PTA meds- midodrine  2.5 mg twice daily.  Currently on none.  H/o CVA with aphasia and hemiplegia PAD, HLD continue Eliquis , Lipitor Supportive care for aphasia and hemiplegia  Seizure disorder Continue Keppra    GERD Continue Protonix   Stage IV lung cancer Has pleural-based mass.  Follows up with oncology. Also noted a plan of palliative radiation     Mobility: Seen by PT.  SNF recommended  Goals of care   Code Status: Do not attempt resuscitation (DNR) PRE-ARREST INTERVENTIONS DESIRED     DVT prophylaxis:   apixaban  (ELIQUIS ) tablet 5 mg   Antimicrobials: Completed course of Rocephin , azithromycin  Fluid: None Consultants: None Family Communication: Family not at bedside  Status: Inpatient Level of care:  Progressive   Patient is from: Home Needs to continue in-hospital care: Pending SNF    Diet:  Diet Order  Diet Heart Room service appropriate? Yes; Fluid consistency: Thin  Diet effective now                   Scheduled Meds:  apixaban   5 mg Oral BID   arformoterol   15 mcg Nebulization BID   And   umeclidinium bromide   1 puff Inhalation Daily   atorvastatin   40 mg Oral Daily   dextromethorphan-guaiFENesin   2 tablet Oral BID   feeding supplement  237 mL Oral BID BM   levETIRAcetam   500 mg Oral BID   pantoprazole   40 mg Oral Daily    PRN meds: acetaminophen  **OR** acetaminophen , chlorpheniramine-HYDROcodone, lip balm, ondansetron   **OR** ondansetron  (ZOFRAN ) IV, mouth rinse, sodium chloride , traZODone    Infusions:    Antimicrobials: Anti-infectives (From admission, onward)    Start     Dose/Rate Route Frequency Ordered Stop   04/26/24 2200  azithromycin  (ZITHROMAX ) 500 mg in sodium chloride  0.9 % 250 mL IVPB        500 mg 250 mL/hr over 60 Minutes Intravenous Every 24 hours 04/26/24 0749 04/29/24 2328   04/26/24 2200  cefTRIAXone  (ROCEPHIN ) 1 g in sodium chloride  0.9 % 100 mL IVPB        1 g 200 mL/hr over 30 Minutes Intravenous Every 24 hours 04/26/24 0749 04/29/24 2201   04/26/24 0030  cefTRIAXone  (ROCEPHIN ) 1 g in sodium chloride  0.9 % 100 mL IVPB        1 g 200 mL/hr over 30 Minutes Intravenous  Once 04/26/24 0028 04/26/24 0153   04/26/24 0030  azithromycin  (ZITHROMAX ) 500 mg in sodium chloride  0.9 % 250 mL IVPB        500 mg 250 mL/hr over 60 Minutes Intravenous  Once 04/26/24 0028 04/26/24 0153       Objective: Vitals:   05/01/24 0914 05/01/24 1353  BP:  104/65  Pulse:  88  Resp:  20  Temp:  99.3 F (37.4 C)  SpO2: 92% (!) 89%    Intake/Output Summary (Last 24 hours) at 05/01/2024 1631 Last data filed at 05/01/2024 1000 Gross per 24 hour  Intake 480 ml  Output 400 ml  Net 80 ml   Filed Weights   04/26/24 1008  Weight: 90.9 kg   Weight change:  Body mass index is 27.17 kg/m.   Physical Exam: General exam: Pleasant, elderly Caucasian male Skin: No rashes, lesions or ulcers. HEENT: Atraumatic, normocephalic, no obvious bleeding Lungs: Clear to auscultation bilaterally,  CVS: S1, S2, no murmur,   GI/Abd: Soft, nontender, nondistended, bowel sound present,   CNS: Alert, awake, at baseline has dysarthria and right hemiplegia Psychiatry: Mood appropriate,  Extremities: No pedal edema, no calf tenderness,   Data Review: I have personally reviewed the laboratory data and studies available.  F/u labs ordered Unresulted Labs (From admission, onward)     Start     Ordered   05/02/24  0500  Basic metabolic panel with GFR  Tomorrow morning,   R       Question:  Specimen collection method  Answer:  Lab=Lab collect   05/01/24 1631   05/02/24 0500  CBC with Differential/Platelet  Tomorrow morning,   R       Question:  Specimen collection method  Answer:  Lab=Lab collect   05/01/24 1631   04/27/24 1256  Occult blood card to lab, stool  ONCE - STAT,   STAT        04/27/24 1256  Signed, Hoyt Macleod, MD [facility] 05/01/2024

## 2024-05-01 NOTE — TOC Initial Note (Signed)
 Transition of Care [facility]) - Initial/Assessment Note    Patient Details  Name: Todd Estrada MRN: [id number] Date of Birth: [birth year]  Transition of Care [facility]) CM/SW Contact:    Loreda Rodriguez, RN Phone Number:[phone]  05/01/2024, 12:27 PM  Clinical Narrative:                 [facility] acknowledges consult for SNF placement. CM at bedside to explain recommendation patient nods in agreement and gives CM permission to speak with his cousin Royston Cornea who is listed on the chart. CM has spoken with Royston Cornea who is in agreement with short term SNF  if the patient will be agreeable and follow through.  Cousin confirms that patient does live with cousin. Cousin states that patient does have wheelchair but has managed well.   FL2 completed PASRR # [id number]. Patients info has been faxed out for bed offers.   Expected Discharge Plan: Skilled Nursing Facility Barriers to Discharge: SNF Pending bed offer   Patient Goals and CMS Choice Patient states their goals for this hospitalization and ongoing recovery are:: rehab CMS Medicare.gov Compare Post Acute Care list provided to:: Patient Represenative (must comment) (cousin [name] per patient request) Choice offered to / list presented to :  (cousin Zaylan Woosley) [place] ownership interest in [facility].provided to:: Patient    Expected Discharge Plan and Services In-house Referral: NA Discharge Planning Services: CM Consult Post Acute Care Choice: Skilled Nursing Facility Living arrangements for the past 2 months: Single Family Home                 DME Arranged: N/A DME Agency: NA       HH Arranged: NA HH Agency: NA        Prior Living Arrangements/Services Living arrangements for the past 2 months: Single Family Home Lives with:: Relatives Patient language and need for interpreter reviewed:: Yes Do you feel safe going back to the place where you live?: Yes      Need for Family Participation in Patient Care: Yes  (Comment) Care giver support system in place?: Yes (comment) Current home services: DME (wheelchair) Criminal Activity/Legal Involvement Pertinent to Current Situation/Hospitalization: No - Comment as needed  Activities of Daily Living   ADL Screening (condition at time of admission) Independently performs ADLs?: No Does the patient have a NEW difficulty with bathing/dressing/toileting/self-feeding that is expected to last >3 days?: No Does the patient have a NEW difficulty with getting in/out of bed, walking, or climbing stairs that is expected to last >3 days?: No Does the patient have a NEW difficulty with communication that is expected to last >3 days?: No Is the patient deaf or have difficulty hearing?: No Does the patient have difficulty seeing, even when wearing glasses/contacts?: No Does the patient have difficulty concentrating, remembering, or making decisions?: No  Permission Sought/Granted Permission sought to share information with : Family Supports Permission granted to share information with : Yes, Verbal Permission Granted  Share Information with NAME: Daymian Vespa     Permission granted to share info w Relationship: cousin  Permission granted to share info w Contact Information: [phone]  Emotional Assessment Appearance:: Appears older than stated age Attitude/Demeanor/Rapport: Unable to Assess Affect (typically observed): Unable to Assess Orientation: : Oriented to Self, Oriented to Place Alcohol / Substance Use: Not Applicable Psych Involvement: No (comment)  Admission diagnosis:  Weakness [R53.1] CAP (community acquired pneumonia) [J18.9] AKI (acute kidney injury) (HCC) [N17.9] Multifocal pneumonia [J18.9] Patient Active Problem List  Diagnosis Date Noted   Coronary artery calcification of native artery 04/29/2024   Acute respiratory failure with hypoxia (HCC) 04/29/2024   History of stroke 04/29/2024   PAF (paroxysmal atrial fibrillation) (HCC)  04/28/2024   CAP (community acquired pneumonia) 04/26/2024   Acute cough 04/23/2024   Edema 04/23/2024   CVA (cerebral vascular accident) (HCC) 01/29/2024   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 07/07/2023   Acute gangrenous cholecystitis 07/04/2023   Right upper lobe pneumonia 04/13/2023   Buttock wound, right, subsequent encounter 03/01/2023   PAD (peripheral artery disease) (HCC) 01/06/2023   Hypotension 01/06/2023   History of pulmonary embolism 10/12/2022   Cervical transverse process fracture (HCC) 09/10/2022   Anemia of chronic disease 08/13/2022   Chronic heart failure with mildly reduced ejection fraction (HFmrEF) (HCC) 08/13/2022   Necrotizing pneumonia (HCC) 08/12/2022   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) 06/24/2022   Encounter for antineoplastic chemotherapy 06/24/2022   Seizure (HCC) 06/02/2022   Squamous cell lung cancer (HCC) 05/10/2022   Emphysema lung (HCC) 04/14/2022   Dyslipidemia    Dysphagia, post-stroke    Internal carotid artery occlusion, left 03/03/2022   Malnutrition of moderate degree 03/03/2022   Tobacco use disorder 08/19/2021   PCP:  Dorthy Gavia, MD Pharmacy:   [facility] #[id number] Jonette Nestle, [place] - [address] AT [facility] [address] [place] [address] Phone: [phone] Fax: [phone]     Social Drivers of Health (SDOH) Social History: SDOH Screenings   Food Insecurity: No Food Insecurity (04/26/2024)  Housing: Low Risk  (04/26/2024)  Transportation Needs: No Transportation Needs (04/26/2024)  Utilities: Not At Risk (04/26/2024)  Depression (PHQ2-9): Low Risk  (09/20/2023)  Social Connections: Socially Isolated (04/26/2024)  Tobacco Use: Medium Risk (04/25/2024)   SDOH Interventions:     Readmission Risk Interventions    05/01/2024   12:09 PM 07/25/2023   12:32 PM 07/04/2023    3:54 PM  Readmission Risk Prevention Plan  Transportation Screening Complete Complete  Complete  Medication Review Oceanographer) Complete Complete Complete  PCP or Specialist appointment within 3-5 days of discharge Complete  Complete  HRI or Home Care Consult Complete Complete Complete  SW Recovery Care/Counseling Consult Complete Complete Complete  Palliative Care Screening Not Applicable Not Applicable Not Applicable  Skilled Nursing Facility Complete Not Applicable Not Applicable

## 2024-05-01 NOTE — NC FL2 (Signed)
 [place]  MEDICAID FL2 LEVEL OF CARE FORM     IDENTIFICATION  Patient Name: Todd Estrada Birthdate: 06/13/1961 Sex: male Admission Date (Current Location): 04/25/2024  [facility] and [place] Number:  Producer, television/film/video and Address:  [facility],  [address], [place] [address]      Provider Number: [id number]  Attending Physician Name and Address:  Hoyt Macleod, MD  Relative Name and Phone Number:  Ugochukwu Norum [phone]    Current Level of Care: Hospital Recommended Level of Care: Skilled Nursing Facility Prior Approval Number:    Date Approved/Denied:   PASRR Number: [id number]  Discharge Plan: SNF    Current Diagnoses: Patient Active Problem List   Diagnosis Date Noted   Coronary artery calcification of native artery 04/29/2024   Acute respiratory failure with hypoxia (HCC) 04/29/2024   History of stroke 04/29/2024   PAF (paroxysmal atrial fibrillation) (HCC) 04/28/2024   CAP (community acquired pneumonia) 04/26/2024   Acute cough 04/23/2024   Edema 04/23/2024   CVA (cerebral vascular accident) (HCC) 01/29/2024   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 07/07/2023   Acute gangrenous cholecystitis 07/04/2023   Right upper lobe pneumonia 04/13/2023   Buttock wound, right, subsequent encounter 03/01/2023   PAD (peripheral artery disease) (HCC) 01/06/2023   Hypotension 01/06/2023   History of pulmonary embolism 10/12/2022   Cervical transverse process fracture (HCC) 09/10/2022   Anemia of chronic disease 08/13/2022   Chronic heart failure with mildly reduced ejection fraction (HFmrEF) (HCC) 08/13/2022   Necrotizing pneumonia (HCC) 08/12/2022   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) 06/24/2022   Encounter for antineoplastic chemotherapy 06/24/2022   Seizure (HCC) 06/02/2022   Squamous cell lung cancer (HCC) 05/10/2022   Emphysema lung (HCC) 04/14/2022   Dyslipidemia    Dysphagia, post-stroke    Internal carotid artery  occlusion, left 03/03/2022   Malnutrition of moderate degree 03/03/2022   Tobacco use disorder 08/19/2021    Orientation RESPIRATION BLADDER Height & Weight     Self, Place  Normal Incontinent Weight: 90.9 kg Height:  6' 0.01" (182.9 cm)  BEHAVIORAL SYMPTOMS/MOOD NEUROLOGICAL BOWEL NUTRITION STATUS     (n/a) Continent Diet (Heart healthy)  AMBULATORY STATUS COMMUNICATION OF NEEDS Skin   Extensive Assist Verbally (expressive aphasia) Other (Comment) (redness noted to buttocks and sacrum)                       Personal Care Assistance Level of Assistance  Bathing, Feeding, Dressing Bathing Assistance: Maximum assistance Feeding assistance: Limited assistance Dressing Assistance: Maximum assistance     Functional Limitations Info  Sight, Hearing, Speech Sight Info: Impaired Hearing Info: Impaired Speech Info: Impaired (expressive aphasia)    SPECIAL CARE FACTORS FREQUENCY  PT (By licensed PT), OT (By licensed OT)     PT Frequency: 5x/wk OT Frequency: 5x/wk            Contractures Contractures Info: Present (right hand)    Additional Factors Info  Code Status, Allergies, Psychotropic, Isolation Precautions, Suctioning Needs, Insulin Sliding Scale Code Status Info: DNR Allergies Info: No known drug allergies Psychotropic Info: see d/c summary Insulin Sliding Scale Info: see dischagre summary Isolation Precautions Info: n/a see discharge summary Suctioning Needs: n/a   Current Medications (05/01/2024):  This is the current hospital active medication list Current Facility-Administered Medications  Medication Dose Route Frequency Provider Last Rate Last Admin   acetaminophen  (TYLENOL ) tablet 650 mg  650 mg Oral Q6H PRN Gaylin Ke, MD  650 mg at 04/29/24 1005   Or   acetaminophen  (TYLENOL ) suppository 650 mg  650 mg Rectal Q6H PRN Jannette Mend, Mir M, MD       apixaban  (ELIQUIS ) tablet 5 mg  5 mg Oral BID Jannette Mend, Mir M, MD   5 mg at 05/01/24 1324    arformoterol  (BROVANA ) nebulizer solution 15 mcg  15 mcg Nebulization BID Ikramullah, Mir M, MD   15 mcg at 05/01/24 0913   And   umeclidinium bromide  (INCRUSE ELLIPTA ) 62.5 MCG/ACT 1 puff  1 puff Inhalation Daily Jannette Mend, Mir M, MD   1 puff at 05/01/24 0912   atorvastatin  (LIPITOR) tablet 40 mg  40 mg Oral Daily Ikramullah, Mir M, MD   40 mg at 05/01/24 0948   chlorpheniramine-HYDROcodone (TUSSIONEX) 10-8 MG/5ML suspension 5 mL  5 mL Oral Q12H PRN Jannette Mend, Mir M, MD   5 mL at 04/29/24 2131   dextromethorphan-guaiFENesin  (MUCINEX  DM) 30-600 MG per 12 hr tablet 2 tablet  2 tablet Oral BID Chavez, Abigail, NP   2 tablet at 05/01/24 0948   feeding supplement (ENSURE ENLIVE / ENSURE PLUS) liquid 237 mL  237 mL Oral BID BM Magdalene School, MD   237 mL at 05/01/24 0948   levETIRAcetam  (KEPPRA ) tablet 500 mg  500 mg Oral BID Jannette Mend, Mir M, MD   500 mg at 05/01/24 4010   lip balm (CARMEX) ointment   Topical PRN Magdalene School, MD   Given at 04/28/24 2224   ondansetron  (ZOFRAN ) tablet 4 mg  4 mg Oral Q6H PRN Jannette Mend, Mir M, MD       Or   ondansetron  (ZOFRAN ) injection 4 mg  4 mg Intravenous Q6H PRN Gaylin Ke, MD       Oral care mouth rinse  15 mL Mouth Rinse PRN Jannette Mend, Mir M, MD       pantoprazole  (PROTONIX ) EC tablet 40 mg  40 mg Oral Daily Ikramullah, Mir M, MD   40 mg at 05/01/24 2725   sodium chloride  (OCEAN) 0.65 % nasal spray 1 spray  1 spray Each Nare PRN Magdalene School, MD   1 spray at 04/28/24 2224   traZODone  (DESYREL ) tablet 25 mg  25 mg Oral QHS PRN Gaylin Ke, MD   25 mg at 04/29/24 2131     Discharge Medications: Please see discharge summary for a list of discharge medications.  Relevant Imaging Results:  Relevant Lab Results:   Additional Information SS# [id number]  Loreda Rodriguez, RN

## 2024-05-02 LAB — BASIC METABOLIC PANEL WITH GFR
Anion gap: 9 (ref 5–15)
BUN: 21 mg/dL (ref 8–23)
CO2: 30 mmol/L (ref 22–32)
Calcium: 8.8 mg/dL — ABNORMAL LOW (ref 8.9–10.3)
Chloride: 99 mmol/L (ref 98–111)
Creatinine, Ser: 0.8 mg/dL (ref 0.61–1.24)
GFR, Estimated: >60 mL/min (ref >60)
Glucose, Bld: 123 mg/dL — ABNORMAL HIGH (ref 70–99)
Potassium: 4.2 mmol/L (ref 3.5–5.1)
Sodium: 138 mmol/L (ref 135–145)

## 2024-05-02 LAB — CBC WITH DIFFERENTIAL/PLATELET
Abs Immature Granulocytes: 0.06 10*3/uL (ref 0.00–0.07)
Basophils Absolute: 0 10*3/uL (ref 0.0–0.1)
Basophils Relative: 0 %
Eosinophils Absolute: 0.3 10*3/uL (ref 0.0–0.5)
Eosinophils Relative: 4 %
HCT: 38.5 % — ABNORMAL LOW (ref 39.0–52.0)
Hemoglobin: 12 g/dL — ABNORMAL LOW (ref 13.0–17.0)
Immature Granulocytes: 1 %
Lymphocytes Relative: 9 %
Lymphs Abs: 0.8 10*3/uL (ref 0.7–4.0)
MCH: 29.4 pg (ref 26.0–34.0)
MCHC: 31.2 g/dL (ref 30.0–36.0)
MCV: 94.4 fL (ref 80.0–100.0)
Monocytes Absolute: 0.6 10*3/uL (ref 0.1–1.0)
Monocytes Relative: 6 %
Neutro Abs: 7.3 10*3/uL (ref 1.7–7.7)
Neutrophils Relative %: 80 %
Platelets: 292 10*3/uL (ref 150–400)
RBC: 4.08 MIL/uL — ABNORMAL LOW (ref 4.22–5.81)
RDW: 13.8 % (ref 11.5–15.5)
WBC: 9 10*3/uL (ref 4.0–10.5)
nRBC: 0 % (ref 0.0–0.2)

## 2024-05-02 NOTE — Progress Notes (Signed)
 Physical Therapy Treatment Patient Details Name: Todd Estrada MRN: [id number] DOB: 1961-07-03 Today's Date: 05/02/2024   History of Present Illness 63 yo male presents to therapy following hospital admission on 04/25/2024 due to CAP and fall OOB. Pt PMH includes but is not limited to:  PNA, COPD, CHF, CVA with aphasia and hemiplegia, lung SCC stage IV, and seizures.    PT Comments   Pt admitted with above diagnosis.  Pt currently with functional limitations due to the deficits listed below (see PT Problem List). Pt in bed when PT arrived. Pt agreeable to therapy intervention. Pt on 3 L/min supplemental o2 and at rest 93% (83 PR) 3 L/min for supine to sit and pt maintained 93% (92 PR), once seated EOB supplemental O2 decreased to 2 L/min with MD recommendations to ween from supplemnetal O2 and pt able to maintain 93-96% (93PR), PT progressed pt to RA and O2 saturation seated EOB 90-92%, pt indicated SOB and pt returned to 2 L/min for sit to supine with CGA and min cues to flat surface and once in supine O2 saturation assessed and pt had desaturated to 88% on 2 L/min and required HOB elevated o2 returned to 3 L/min and 1:27 to recover to 90%. Pt left on 3 L/min, in bed and all needs met. Pt continues to decline transfer training however demonstrated ability to scoot laterally toward HOB/R side seated EOB with CGA and min cues.  Patient will benefit from continued inpatient follow up therapy, <3 hours/day.  Pt will benefit from acute skilled PT to increase their independence and safety with mobility to allow discharge.      If plan is discharge home, recommend the following: Two people to help with walking and/or transfers;A lot of help with bathing/dressing/bathroom;Assistance with cooking/housework;Assistance with feeding;Direct supervision/assist for medications management;Direct supervision/assist for financial management;Assist for transportation;Help with stairs or ramp for entrance   Can travel by  private vehicle     No  Equipment Recommendations  None recommended by PT    Recommendations for Other Services       Precautions / Restrictions Precautions Precautions: Fall;Other (comment) (supplemental O2 and desaturates with exertion) Restrictions Weight Bearing Restrictions Per Provider Order: No     Mobility  Bed Mobility Overal bed mobility: Needs Assistance Bed Mobility: Supine to Sit, Sit to Supine     Supine to sit: Min assist, HOB elevated Sit to supine: Contact guard assist   General bed mobility comments: increased time, cues for sit to supine with min A for trunk and HOB slightly elevated, sit to supine with CGA and min cues and pt able to manage R LE to return to bed.    Transfers                   General transfer comment: NT pt declined to get OOB at time of eval    Ambulation/Gait               General Gait Details: non ambulatory at baseline   Stairs             Wheelchair Mobility     Tilt Bed    Modified Rankin (Stroke Patients Only)       Balance Overall balance assessment: Needs assistance, History of Falls Sitting-balance support: Feet supported, Single extremity supported Sitting balance-Leahy Scale: Fair  Communication Communication Communication: Impaired Factors Affecting Communication: Other (comment);Reduced clarity of speech (hx of CVA)  Cognition Arousal: Alert Behavior During Therapy: Flat affect   PT - Cognitive impairments: History of cognitive impairments, No family/caregiver present to determine baseline                       PT - Cognition Comments: hx of CVA with expressive aphasia Following commands: Impaired Following commands impaired: Only follows one step commands consistently    Cueing Cueing Techniques: Verbal cues, Gestural cues, Tactile cues, Visual cues  Exercises      General Comments        Pertinent  Vitals/Pain Pain Assessment Pain Assessment: No/denies pain    Home Living                          Prior Function            PT Goals (current goals can now be found in the care plan section) Acute Rehab PT Goals PT Goal Formulation: Patient unable to participate in goal setting Progress towards PT goals: Progressing toward goals    Frequency    Min 2X/week      PT Plan      Co-evaluation              AM-PAC PT "6 Clicks" Mobility   Outcome Measure  Help needed turning from your back to your side while in a flat bed without using bedrails?: A Lot Help needed moving from lying on your back to sitting on the side of a flat bed without using bedrails?: A Little Help needed moving to and from a bed to a chair (including a wheelchair)?: Total Help needed standing up from a chair using your arms (e.g., wheelchair or bedside chair)?: Total Help needed to walk in hospital room?: Total Help needed climbing 3-5 steps with a railing? : Total 6 Click Score: 9    End of Session Equipment Utilized During Treatment: Oxygen Activity Tolerance: Patient limited by fatigue Patient left: in bed;with call bell/phone within reach Nurse Communication: Mobility status PT Visit Diagnosis: Unsteadiness on feet (R26.81);Muscle weakness (generalized) (M62.81);History of falling (Z91.81);Difficulty in walking, not elsewhere classified (R26.2);Hemiplegia and hemiparesis Hemiplegia - Right/Left: Right Hemiplegia - caused by: Cerebral infarction     Time: 1152-1219 PT Time Calculation (min) (ACUTE ONLY): 27 min  Charges:    $Therapeutic Activity: 23-37 mins PT General Charges $$ ACUTE PT VISIT: 1 Visit                     Cary Clarks, PT Acute Rehab    Annalee Kiang 05/02/2024, 12:41 PM

## 2024-05-02 NOTE — TOC Progression Note (Signed)
 Transition of Care [facility]) - Progression Note    Patient Details  Name: Todd Estrada MRN: [id number] Date of Birth: 1961-03-14  [facility] CM/SW Contact  Loreda Rodriguez, RN Phone Number:[phone]  05/02/2024, 12:46 PM  Clinical Narrative:    Patient has bed offer from [facility] via the hub. CM has reach out to [facility] with admissions to confirm offer. Awaiting return response.   Per [facility] facility can not accept the patient because his medicaid benefit does not cover rehab.    Expected Discharge Plan: Skilled Nursing Facility Barriers to Discharge: SNF Pending bed offer  Expected Discharge Plan and Services In-house Referral: NA Discharge Planning Services: CM Consult Post Acute Care Choice: Skilled Nursing Facility Living arrangements for the past 2 months: Single Family Home                 DME Arranged: N/A DME Agency: NA       HH Arranged: NA HH Agency: NA         Social Determinants of Health (SDOH) Interventions SDOH Screenings   Food Insecurity: No Food Insecurity (04/26/2024)  Housing: Low Risk  (04/26/2024)  Transportation Needs: No Transportation Needs (04/26/2024)  Utilities: Not At Risk (04/26/2024)  Depression (PHQ2-9): Low Risk  (09/20/2023)  Social Connections: Socially Isolated (04/26/2024)  Tobacco Use: Medium Risk (04/25/2024)    Readmission Risk Interventions    05/01/2024   12:09 PM 07/25/2023   12:32 PM 07/04/2023    3:54 PM  Readmission Risk Prevention Plan  Transportation Screening Complete Complete Complete  Medication Review Oceanographer) Complete Complete Complete  PCP or Specialist appointment within 3-5 days of discharge Complete  Complete  HRI or Home Care Consult Complete Complete Complete  SW Recovery Care/Counseling Consult Complete Complete Complete  Palliative Care Screening Not Applicable Not Applicable Not Applicable  Skilled Nursing Facility Complete Not Applicable Not Applicable

## 2024-05-02 NOTE — Plan of Care (Signed)

## 2024-05-02 NOTE — Plan of Care (Signed)

## 2024-05-02 NOTE — Progress Notes (Signed)
 PROGRESS NOTE  Todd Estrada  DOB: 12-Oct-1961  PCP: Dorthy Gavia, MD UEA:[id number]  DOA: 04/25/2024  LOS: 6 days  Hospital Day: 8  Brief narrative: Todd Estrada is a 62 y.o. male with PMH significant for HTN, HLD, diastolic CHF, paroxysmal A-fib on Eliquis , prior CVA with aphasia and hemiplegia, seizure, PAD, COPD, stage IV lung cancer.  5/8, patient was brought to the ED from home with complaint of several days of cough, congestion, weakness.  In the ED, patient was hypoxic and required 3 L oxygen by nasal cannula CT scan showed bibasilar pneumonia L>R Started on IV antibiotics Admitted to [facility] 5/11, patient had A-fib with RVR.  Cardiology was consulted  Subjective: Patient was seen and examined this morning. Lying on bed.  Not in distress.  No family at bedside. No fever.  Vital signs remained stable. Pending SNF  Assessment and plan: Community-acquired pneumonia Acute hypoxic respiratory failure H/o COPD Presented with cough, congestion, weakness, hypoxia CT scan showed bibasilar pneumonia L>R Currently improving IV antibiotics.  Not requiring steroids. Continue inhalers. Currently requiring 3 L oxygen by nasal cannula.  Wean down as tolerated. Recent Labs  Lab 04/25/24 2042 04/25/24 2049 04/27/24 0904 04/29/24 0545 05/02/24 0606  WBC 7.6  --  9.0 6.0 9.0  LATICACIDVEN  --  1.7  --   --   --    Acute kidney injury Likely prerenal due to decreased intake last few days. Renal function improved with IV fluid. Recent Labs    10/31/23 0905 11/08/23 0430 11/16/23 0117 12/01/23 0156 12/10/23 0758 01/12/24 1815 04/01/24 0601 04/25/24 2042 04/25/24 2050 04/27/24 0904 04/29/24 0545 05/02/24 0606  BUN 15 18 14 18 16 20 22 23 22 10 10 21   CREATININE 0.76 0.90 0.91 0.97 0.80 0.86 1.02 1.74* 1.90* 0.85 0.76 0.80  CO2 27 28 26 28   --  29 27 26   --  25 29 30    A-fib with RVR Paroxysmal A-fib On 5/11, patient had A-fib with RVR.  Cardiology was consulted.   Heart rate self converted to normal sinus rhythm.   Not on any AV nodal blocking agent due to soft blood pressure Continue anticoagulation with Eliquis   Chronic hypOtension PTA meds- midodrine  2.5 mg twice daily.  Currently on none.  H/o CVA with aphasia and hemiplegia PAD, HLD continue Eliquis , Lipitor Supportive care for aphasia and hemiplegia  Seizure disorder Continue Keppra    GERD Continue Protonix   Stage IV lung cancer Has pleural-based mass.  Follows up with oncology. Also noted a plan of palliative radiation     Mobility: Seen by PT.  SNF recommended  Goals of care   Code Status: Do not attempt resuscitation (DNR) PRE-ARREST INTERVENTIONS DESIRED     DVT prophylaxis:   apixaban  (ELIQUIS ) tablet 5 mg   Antimicrobials: Completed course of Rocephin , azithromycin  Fluid: None Consultants: None Family Communication: Family not at bedside  Status: Inpatient Level of care:  Progressive   Patient is from: Home Needs to continue in-hospital care: Pending SNF    Diet:  Diet Order             Diet Heart Room service appropriate? Yes; Fluid consistency: Thin  Diet effective now                   Scheduled Meds:  apixaban   5 mg Oral BID   arformoterol   15 mcg Nebulization BID   And   umeclidinium bromide   1 puff Inhalation Daily   atorvastatin   40  mg Oral Daily   dextromethorphan-guaiFENesin   2 tablet Oral BID   feeding supplement  237 mL Oral BID BM   levETIRAcetam   500 mg Oral BID   pantoprazole   40 mg Oral Daily    PRN meds: acetaminophen  **OR** acetaminophen , chlorpheniramine-HYDROcodone, lip balm, ondansetron  **OR** ondansetron  (ZOFRAN ) IV, mouth rinse, sodium chloride , traZODone    Infusions:    Antimicrobials: Anti-infectives (From admission, onward)    Start     Dose/Rate Route Frequency Ordered Stop   04/26/24 2200  azithromycin  (ZITHROMAX ) 500 mg in sodium chloride  0.9 % 250 mL IVPB        500 mg 250 mL/hr over 60 Minutes  Intravenous Every 24 hours 04/26/24 0749 04/29/24 2328   04/26/24 2200  cefTRIAXone  (ROCEPHIN ) 1 g in sodium chloride  0.9 % 100 mL IVPB        1 g 200 mL/hr over 30 Minutes Intravenous Every 24 hours 04/26/24 0749 04/29/24 2201   04/26/24 0030  cefTRIAXone  (ROCEPHIN ) 1 g in sodium chloride  0.9 % 100 mL IVPB        1 g 200 mL/hr over 30 Minutes Intravenous  Once 04/26/24 0028 04/26/24 0153   04/26/24 0030  azithromycin  (ZITHROMAX ) 500 mg in sodium chloride  0.9 % 250 mL IVPB        500 mg 250 mL/hr over 60 Minutes Intravenous  Once 04/26/24 0028 04/26/24 0153       Objective: Vitals:   05/02/24 0847 05/02/24 1351  BP:  101/67  Pulse:  90  Resp:  17  Temp:  98.3 F (36.8 C)  SpO2: 94% 94%    Intake/Output Summary (Last 24 hours) at 05/02/2024 1445 Last data filed at 05/02/2024 1100 Gross per 24 hour  Intake 680 ml  Output 875 ml  Net -195 ml   Filed Weights   04/26/24 1008  Weight: 90.9 kg   Weight change:  Body mass index is 27.17 kg/m.   Physical Exam: General exam: Pleasant, elderly Caucasian male Skin: No rashes, lesions or ulcers. HEENT: Atraumatic, normocephalic, no obvious bleeding Lungs: Clear to auscultation bilaterally,  CVS: S1, S2, no murmur,   GI/Abd: Soft, nontender, nondistended, bowel sound present,   CNS: Alert, awake, at baseline has dysarthria and right hemiplegia Psychiatry: Mood appropriate,  Extremities: No pedal edema, no calf tenderness,   Data Review: I have personally reviewed the laboratory data and studies available.  F/u labs ordered Unresulted Labs (From admission, onward)     Start     Ordered   04/27/24 1256  Occult blood card to lab, stool  ONCE - STAT,   STAT        04/27/24 1256            Signed, Hoyt Macleod, MD [facility] 05/02/2024

## 2024-05-03 MED ORDER — ENSURE ENLIVE PO LIQD: 237.0000 mL | Freq: Two times a day (BID) | ORAL | 12 refills | Status: DC

## 2024-05-03 MED ORDER — DM-GUAIFENESIN ER 30-600 MG PO TB12: 2.0000 | ORAL_TABLET | Freq: Two times a day (BID) | ORAL | 0 refills | Status: AC

## 2024-05-03 NOTE — Discharge Summary (Signed)
 Physician Discharge Summary  Todd Estrada UJW:[id number] DOB: 1961-04-29 DOA: 04/25/2024  PCP: Dorthy Gavia, MD  Admit date: 04/25/2024 Discharge date: 05/03/2024  Admitted From: Home Discharge disposition: Home   Recommendations at discharge:  Mucinex  as needed   Brief narrative: Todd Estrada is a 63 y.o. male with PMH significant for HTN, HLD, diastolic CHF, paroxysmal A-fib on Eliquis , prior CVA with aphasia and hemiplegia, seizure, PAD, COPD, stage IV lung cancer.  Wheelchair-bound status. 5/8, patient was brought to the ED from home with complaint of several days of cough, congestion, weakness.  In the ED, patient was hypoxic and required 3 L oxygen by nasal cannula CT scan showed bibasilar pneumonia L>R Started on IV antibiotics Admitted to [facility] 5/11, patient had A-fib with RVR.  Cardiology was consulted  Subjective: Patient was seen and examined this morning. Lying on bed.  Not in distress.  No family at bedside. No fever.  Vital signs remained stable. PT recommended SNF but apparently patient does not have benefits that we will cover rehab.  Plan to discharge home today.  Per family, patient does not accept home health services.  Hospital course: Community-acquired pneumonia Acute hypoxic respiratory failure H/o COPD Presented with cough, congestion, weakness, hypoxia CT scan showed bibasilar pneumonia L>R Completed 5-day course of antibiotics Continue inhalers. Initially required supplemental oxygen.  Currently breathing on room air. Recent Labs  Lab 04/27/24 0904 04/29/24 0545 05/02/24 0606  WBC 9.0 6.0 9.0   Acute kidney injury Likely prerenal due to decreased intake last few days. Renal function improved with IV fluid. Recent Labs    10/31/23 0905 11/08/23 0430 11/16/23 0117 12/01/23 0156 12/10/23 0758 01/12/24 1815 04/01/24 0601 04/25/24 2042 04/25/24 2050 04/27/24 0904 04/29/24 0545 05/02/24 0606  BUN 15 18 14 18 16 20 22 23 22 10 10 21    CREATININE 0.76 0.90 0.91 0.97 0.80 0.86 1.02 1.74* 1.90* 0.85 0.76 0.80  CO2 27 28 26 28   --  29 27 26   --  25 29 30    A-fib with RVR Paroxysmal A-fib On 5/11, patient had A-fib with RVR.  Cardiology was consulted.  Heart rate self converted to normal sinus rhythm.   Not on any AV nodal blocking agent due to soft blood pressure Continue anticoagulation with Eliquis   Chronic hypOtension PTA meds- midodrine  2.5 mg twice daily.  Currently on none.  I do not think he needs it resumed at discharge.  H/o CVA with aphasia and hemiplegia PAD, HLD continue Eliquis , Lipitor Supportive care for aphasia and hemiplegia  Seizure disorder Continue Keppra    GERD Continue Protonix   Stage IV lung cancer Has pleural-based mass.  Follows up with oncology. Also noted a plan of palliative radiation   Mobility: Seen by PT.  SNF recommended  Goals of care   Code Status: Do not attempt resuscitation (DNR) PRE-ARREST INTERVENTIONS DESIRED   Diet:  Diet Order             Diet general           Diet Heart Room service appropriate? Yes; Fluid consistency: Thin  Diet effective now                   Nutritional status:  Body mass index is 27.17 kg/m.       Wounds:  -    Discharge Exam:   Vitals:   05/03/24 0610 05/03/24 0925 05/03/24 1131 05/03/24 1335  BP: 118/68   110/65  Pulse: 82   (!) 101  Resp: 18   20  Temp: 98.1 F (36.7 C)   98.6 F (37 C)  TempSrc: Oral   Oral  SpO2: 94% 95% 90% 95%  Weight:      Height:        Body mass index is 27.17 kg/m.  General exam: Pleasant, elderly Caucasian male Skin: No rashes, lesions or ulcers. HEENT: Atraumatic, normocephalic, no obvious bleeding Lungs: Clear to auscultation bilaterally,  CVS: S1, S2, no murmur,   GI/Abd: Soft, nontender, nondistended, bowel sound present,   CNS: Alert, awake, at baseline has dysarthria and right hemiplegia Psychiatry: Mood appropriate,  Extremities: No pedal edema, no calf  tenderness  Follow ups:    Follow-up Information     Flo Hummingbird, PA-C Follow up on 06/10/2024.   Specialty: Cardiology Why: follow up with Theotis Flake PA-C on June 23 at 10:45 am. Please arrive 15 minutes early Contact information: [address] [place] [address] [phone]         Jann Melody, MD Follow up.   Specialty: Cardiology Why: Follow up with Dr Paulita Boss on 08/28/24 at 8 am. please arrive 15 minutes early Contact information: [address] [place] [address] [phone]         Dorthy Gavia, MD Follow up.   Specialty: Internal Medicine Contact information: [address] [place] [address] [phone]                 Discharge Instructions:   Discharge Instructions     Call MD for:  difficulty breathing, headache or visual disturbances   Complete by: As directed    Call MD for:  extreme fatigue   Complete by: As directed    Call MD for:  hives   Complete by: As directed    Call MD for:  persistant dizziness or light-headedness   Complete by: As directed    Call MD for:  persistant nausea and vomiting   Complete by: As directed    Call MD for:  severe uncontrolled pain   Complete by: As directed    Call MD for:  temperature >100.4   Complete by: As directed    Diet general   Complete by: As directed    Discharge instructions   Complete by: As directed    Recommendations at discharge:   Mucinex  as needed   General discharge instructions: Follow with Primary MD Dorthy Gavia, MD in 7 days  Please request your PCP  to go over your hospital tests, procedures, radiology results at the follow up. Please get your medicines reviewed and adjusted.  Your PCP may decide to repeat certain labs or tests as needed. Do not drive, operate heavy machinery, perform activities at heights, swimming or participation in water  activities or provide baby sitting services if your were admitted for syncope  or siezures until you have seen by Primary MD or a Neurologist and advised to do so again. [place]  Controlled Substance Reporting System database was reviewed. Do not drive, operate heavy machinery, perform activities at heights, swim, participate in water  activities or provide baby-sitting services while on medications for pain, sleep and mood until your outpatient physician has reevaluated you and advised to do so again.  You are strongly recommended to comply with the dose, frequency and duration of prescribed medications. Activity: As tolerated with Full fall precautions use walker/cane & assistance as needed Avoid using any recreational substances like cigarette, tobacco, alcohol, or non-prescribed drug. If you experience worsening of your  admission symptoms, develop shortness of breath, life threatening emergency, suicidal or homicidal thoughts you must seek medical attention immediately by calling 911 or calling your MD immediately  if symptoms less severe. You must read complete instructions/literature along with all the possible adverse reactions/side effects for all the medicines you take and that have been prescribed to you. Take any new medicine only after you have completely understood and accepted all the possible adverse reactions/side effects.  Wear Seat belts while driving. You were cared for by a hospitalist during your hospital stay. If you have any questions about your discharge medications or the care you received while you were in the hospital after you are discharged, you can call the unit and ask to speak with the hospitalist or the covering physician. Once you are discharged, your primary care physician will handle any further medical issues. Please note that NO REFILLS for any discharge medications will be authorized once you are discharged, as it is imperative that you return to your primary care physician (or establish a relationship with a primary care physician if you do  not have one).   Increase activity slowly   Complete by: As directed        Discharge Medications:   Allergies as of 05/03/2024   No Known Allergies      Medication List     STOP taking these medications    amoxicillin -clavulanate 875-125 MG tablet Commonly known as: AUGMENTIN    hyoscyamine  0.125 MG SL tablet Commonly known as: Levsin/SL   midodrine  2.5 MG tablet Commonly known as: PROAMATINE        TAKE these medications    albuterol  (2.5 MG/3ML) 0.083% nebulizer solution Commonly known as: PROVENTIL  Take 3 mLs (2.5 mg total) by nebulization every 4 (four) hours as needed for wheezing or shortness of breath.   apixaban  5 MG Tabs tablet Commonly known as: Eliquis  Take 1 tablet (5 mg total) by mouth 2 (two) times daily.   atorvastatin  40 MG tablet Commonly known as: Lipitor Take 1 tablet (40 mg total) by mouth daily.   chlorpheniramine-HYDROcodone 10-8 MG/5ML Commonly known as: TUSSIONEX Take 5 mLs by mouth every 12 (twelve) hours as needed for cough.   dextromethorphan-guaiFENesin  30-600 MG 12hr tablet Commonly known as: MUCINEX  DM Take 2 tablets by mouth 2 (two) times daily for 7 days.   feeding supplement Liqd Take 237 mLs by mouth 2 (two) times daily between meals.   levETIRAcetam  500 MG tablet Commonly known as: KEPPRA  Take 1 tablet (500 mg total) by mouth 2 (two) times daily.   multivitamin with minerals Tabs tablet Take 1 tablet by mouth daily.   pantoprazole  40 MG tablet Commonly known as: PROTONIX  TAKE 1 TABLET(40 MG) BY MOUTH DAILY What changed: See the new instructions.   Stiolto Respimat  2.5-2.5 MCG/ACT Aers Generic drug: Tiotropium Bromide-Olodaterol NEW PRESCRIPTION REQUEST: STIOLTO 2.5 MCG- INHALE TWO PUFFS BY MOUTH DAILY What changed:  how much to take how to take this when to take this additional instructions         The results of significant diagnostics from this hospitalization (including imaging, microbiology,  ancillary and laboratory) are listed below for reference.    Procedures and Diagnostic Studies:   CT CHEST ABDOMEN PELVIS W CONTRAST Result Date: 04/25/2024 CLINICAL DATA:  Fever, cough EXAM: CT CHEST, ABDOMEN, AND PELVIS WITH CONTRAST TECHNIQUE: Multidetector CT imaging of the chest, abdomen and pelvis was performed following the standard protocol during bolus administration of intravenous contrast. RADIATION DOSE REDUCTION: This exam was  performed according to the departmental dose-optimization program which includes automated exposure control, adjustment of the mA and/or kV according to patient size and/or use of iterative reconstruction technique. CONTRAST:  80mL OMNIPAQUE  IOHEXOL  300 MG/ML  SOLN COMPARISON:  Abdominal CT 04/01/2024.  Chest CT 03/01/2024 FINDINGS: CT CHEST FINDINGS Cardiovascular: Heart is normal size. Aorta is normal caliber. Scattered coronary artery and aortic atherosclerosis. Mediastinum/Nodes: No mediastinal, hilar, or axillary adenopathy. Trachea and esophagus are unremarkable. Thyroid unremarkable. Lungs/Pleura: Severe centrilobular emphysema. Stable bandlike density peripherally in the right upper lobe with central fiducial marker, similar to prior study. Stable bandlike scarring in the anterior left upper lobe. New consolidation in the left lower lobe and to a lesser extent right lower lobe most compatible with pneumonia. No effusions. Musculoskeletal: Chest wall soft tissues are unremarkable. No acute bony abnormality. Again noted is the right lateral 5th rib fracture, unchanged since prior study. CT ABDOMEN PELVIS FINDINGS Hepatobiliary: No focal liver abnormality is seen. Status post cholecystectomy. No biliary dilatation. Pancreas: No focal abnormality or ductal dilatation. Spleen: No focal abnormality.  Normal size. Adrenals/Urinary Tract: No adrenal abnormality. No focal renal abnormality. No stones or hydronephrosis. Urinary bladder is unremarkable. Stomach/Bowel: Stomach,  large and small bowel grossly unremarkable. Vascular/Lymphatic: Aortic atherosclerosis. No evidence of aneurysm or adenopathy. Reproductive: No visible focal abnormality. Other: No free fluid or free air. Musculoskeletal: No acute bony abnormality. IMPRESSION: New airspace disease in the left lower lobe greater than right lower lobe compatible with multifocal pneumonia. Chronic band like densities in the right upper lobe and left upper lobe compatible with post treatment scarring. Coronary artery disease, aortic atherosclerosis. No acute findings in the abdomen or pelvis. Electronically Signed   By: Janeece Mechanic M.D.   On: 04/25/2024 22:20   DG Chest Port 1 View Result Date: 04/25/2024 CLINICAL DATA:  Fall. EXAM: PORTABLE CHEST 1 VIEW COMPARISON:  CT chest 03/01/2024 FINDINGS: Stable cardiomediastinal silhouette. Nodular scarring in both mid lungs. The scarring is about a fiducial marker in the right mid lung. Chronic bronchitic changes. Hyperinflation. No new focal consolidation, pleural effusion, or pneumothorax. IMPRESSION: 1. No acute cardiopulmonary disease. 2. Nodular scarring in both mid lungs. 3. Emphysema. Electronically Signed   By: Rozell Cornet M.D.   On: 04/25/2024 21:14     Labs:   Basic Metabolic Panel: Recent Labs  Lab 04/27/24 0904 04/29/24 0545 05/02/24 0606  NA 138 138 138  K 3.3* 3.7 4.2  CL 103 98 99  CO2 25 29 30   GLUCOSE 140* 109* 123*  BUN 10 10 21   CREATININE 0.85 0.76 0.80  CALCIUM  8.3* 8.6* 8.8*  MG  --  2.1  --   PHOS  --  3.7  --    GFR Estimated Creatinine Clearance: 103.7 mL/min (by C-G formula based on SCr of 0.8 mg/dL). Liver Function Tests: No results for input(s): "AST", "ALT", "ALKPHOS", "BILITOT", "PROT", "ALBUMIN " in the last 168 hours. No results for input(s): "LIPASE", "AMYLASE" in the last 168 hours. No results for input(s): "AMMONIA" in the last 168 hours. Coagulation profile No results for input(s): "INR", "PROTIME" in the last 168  hours.  CBC: Recent Labs  Lab 04/27/24 0904 04/27/24 1424 04/29/24 0545 05/02/24 0606  WBC 9.0  --  6.0 9.0  NEUTROABS  --   --   --  7.3  HGB 7.7* 9.6* 11.1* 12.0*  HCT 25.6* 30.8* 36.7* 38.5*  MCV 99.6  --  95.6 94.4  PLT 158  --  181 292   Cardiac  Enzymes: No results for input(s): "CKTOTAL", "CKMB", "CKMBINDEX", "TROPONINI" in the last 168 hours. BNP: Invalid input(s): "POCBNP" CBG: No results for input(s): "GLUCAP" in the last 168 hours. D-Dimer No results for input(s): "DDIMER" in the last 72 hours. Hgb A1c No results for input(s): "HGBA1C" in the last 72 hours. Lipid Profile No results for input(s): "CHOL", "HDL", "LDLCALC", "TRIG", "CHOLHDL", "LDLDIRECT" in the last 72 hours. Thyroid function studies No results for input(s): "TSH", "T4TOTAL", "T3FREE", "THYROIDAB" in the last 72 hours.  Invalid input(s): "FREET3" Anemia work up No results for input(s): "VITAMINB12", "FOLATE", "FERRITIN", "TIBC", "IRON", "RETICCTPCT" in the last 72 hours. Microbiology Recent Results (from the past 240 hours)  Resp panel by RT-PCR (RSV, Flu A&B, Covid) Anterior Nasal Swab     Status: None   Collection Time: 04/23/24  3:14 PM   Specimen: Anterior Nasal Swab  Result Value Ref Range Status   SARS Coronavirus 2 by RT PCR NEGATIVE NEGATIVE Final   Influenza A by PCR NEGATIVE NEGATIVE Final   Influenza B by PCR NEGATIVE NEGATIVE Final    Comment: (NOTE) The Xpert Xpress SARS-CoV-2/FLU/RSV plus assay is intended as an aid in the diagnosis of influenza from Nasopharyngeal swab specimens and should not be used as a sole basis for treatment. Nasal washings and aspirates are unacceptable for Xpert Xpress SARS-CoV-2/FLU/RSV testing.  Fact Sheet for Patients: BloggerCourse.com  Fact Sheet for Healthcare Providers: SeriousBroker.it  This test is not yet approved or cleared by the [place]  FDA and has been authorized for detection  and/or diagnosis of SARS-CoV-2 by FDA under an Emergency Use Authorization (EUA). This EUA will remain in effect (meaning this test can be used) for the duration of the COVID-19 declaration under Section 564(b)(1) of the Act, 21 U.S.C. section 360bbb-3(b)(1), unless the authorization is terminated or revoked.     Resp Syncytial Virus by PCR NEGATIVE NEGATIVE Final    Comment: (NOTE) Fact Sheet for Patients: BloggerCourse.com  Fact Sheet for Healthcare Providers: SeriousBroker.it  This test is not yet approved or cleared by the [place]  FDA and has been authorized for detection and/or diagnosis of SARS-CoV-2 by FDA under an Emergency Use Authorization (EUA). This EUA will remain in effect (meaning this test can be used) for the duration of the COVID-19 declaration under Section 564(b)(1) of the Act, 21 U.S.C. section 360bbb-3(b)(1), unless the authorization is terminated or revoked.  Performed at [facility], [address] [place], [place] [address]   Resp panel by RT-PCR (RSV, Flu A&B, Covid) Anterior Nasal Swab     Status: None   Collection Time: 04/25/24  8:33 PM   Specimen: Anterior Nasal Swab  Result Value Ref Range Status   SARS Coronavirus 2 by RT PCR NEGATIVE NEGATIVE Final    Comment: (NOTE) SARS-CoV-2 target nucleic acids are NOT DETECTED.  The SARS-CoV-2 RNA is generally detectable in upper respiratory specimens during the acute phase of infection. The lowest concentration of SARS-CoV-2 viral copies this assay can detect is 138 copies/mL. A negative result does not preclude SARS-Cov-2 infection and should not be used as the sole basis for treatment or other patient management decisions. A negative result may occur with  improper specimen collection/handling, submission of specimen other than nasopharyngeal swab, presence of viral mutation(s) within the areas targeted by this assay, and inadequate  number of viral copies(<138 copies/mL). A negative result must be combined with clinical observations, patient history, and epidemiological information. The expected result is Negative.  Fact Sheet for Patients:  BloggerCourse.com  Fact Sheet for Healthcare Providers:  SeriousBroker.it  This test is no t yet approved or cleared by the [place]  FDA and  has been authorized for detection and/or diagnosis of SARS-CoV-2 by FDA under an Emergency Use Authorization (EUA). This EUA will remain  in effect (meaning this test can be used) for the duration of the COVID-19 declaration under Section 564(b)(1) of the Act, 21 U.S.C.section 360bbb-3(b)(1), unless the authorization is terminated  or revoked sooner.       Influenza A by PCR NEGATIVE NEGATIVE Final   Influenza B by PCR NEGATIVE NEGATIVE Final    Comment: (NOTE) The Xpert Xpress SARS-CoV-2/FLU/RSV plus assay is intended as an aid in the diagnosis of influenza from Nasopharyngeal swab specimens and should not be used as a sole basis for treatment. Nasal washings and aspirates are unacceptable for Xpert Xpress SARS-CoV-2/FLU/RSV testing.  Fact Sheet for Patients: BloggerCourse.com  Fact Sheet for Healthcare Providers: SeriousBroker.it  This test is not yet approved or cleared by the [place]  FDA and has been authorized for detection and/or diagnosis of SARS-CoV-2 by FDA under an Emergency Use Authorization (EUA). This EUA will remain in effect (meaning this test can be used) for the duration of the COVID-19 declaration under Section 564(b)(1) of the Act, 21 U.S.C. section 360bbb-3(b)(1), unless the authorization is terminated or revoked.     Resp Syncytial Virus by PCR NEGATIVE NEGATIVE Final    Comment: (NOTE) Fact Sheet for Patients: BloggerCourse.com  Fact Sheet for Healthcare  Providers: SeriousBroker.it  This test is not yet approved or cleared by the [place]  FDA and has been authorized for detection and/or diagnosis of SARS-CoV-2 by FDA under an Emergency Use Authorization (EUA). This EUA will remain in effect (meaning this test can be used) for the duration of the COVID-19 declaration under Section 564(b)(1) of the Act, 21 U.S.C. section 360bbb-3(b)(1), unless the authorization is terminated or revoked.  Performed at [facility], [address] [place], [place] [address]   Blood Culture (routine x 2)     Status: None   Collection Time: 04/25/24  8:42 PM   Specimen: BLOOD  Result Value Ref Range Status   Specimen Description   Final    BLOOD LEFT ANTECUBITAL Performed at [facility], [address] [place], [place] [address]    Special Requests   Final    BOTTLES DRAWN AEROBIC AND ANAEROBIC Blood Culture adequate volume Performed at [facility], [address], [place], [place] [address]    Culture   Final    NO GROWTH 5 DAYS Performed at [facility], [address] [place], [place] [address]    Report Status 04/30/2024 FINAL  Final  Blood Culture (routine x 2)     Status: None   Collection Time: 04/25/24  8:42 PM   Specimen: BLOOD LEFT FOREARM  Result Value Ref Range Status   Specimen Description   Final    BLOOD LEFT FOREARM Performed at [facility], [address] [place], [place] [address]    Special Requests   Final    BOTTLES DRAWN AEROBIC AND ANAEROBIC Blood Culture adequate volume Performed at [facility], [address], [place], [place] [address]    Culture   Final    NO GROWTH 5 DAYS Performed at [facility], [address] [place], [place] [address]    Report Status 04/30/2024 FINAL  Final    Time  coordinating discharge: 45 minutes  Signed: Esvin Hnat  [facility] 05/03/2024,  2:34 PM

## 2024-05-03 NOTE — Plan of Care (Signed)
  Problem: Education: Goal: Knowledge of General Education information will improve Description: Including pain rating scale, medication(s)/side effects and non-pharmacologic comfort measures Outcome: Progressing   Problem: Health Behavior/Discharge Planning: Goal: Ability to manage health-related needs will improve Outcome: Progressing   Problem: Clinical Measurements: Goal: Ability to maintain clinical measurements within normal limits will improve Outcome: Progressing Goal: Will remain free from infection Outcome: Progressing Goal: Diagnostic test results will improve Outcome: Progressing Goal: Respiratory complications will improve Outcome: Progressing Goal: Cardiovascular complication will be avoided Outcome: Progressing   Problem: Activity: Goal: Risk for activity intolerance will decrease Outcome: Progressing   Problem: Nutrition: Goal: Adequate nutrition will be maintained Outcome: Progressing   Problem: Skin Integrity: Goal: Risk for impaired skin integrity will decrease Outcome: Progressing   Problem: Safety: Goal: Ability to remain free from injury will improve Outcome: Progressing   Problem: Pain Managment: Goal: General experience of comfort will improve and/or be controlled Outcome: Progressing

## 2024-05-03 NOTE — TOC Progression Note (Addendum)
 Transition of Care [facility]) - Progression Note    Patient Details  Name: Todd Estrada MRN: [id number] Date of Birth: [birth year]  Transition of Care [facility]) CM/SW Contact  Loreda Rodriguez, RN Phone [phone]  05/03/2024, 9:38 AM  Clinical Narrative:    Patient does not have benefits that will cover rehab. Patient is wheelchair bound at baseline. TOC unable to place in SNF without payor source. CM will update MD.    Expected Discharge Plan: Skilled Nursing Facility Barriers to Discharge: SNF Pending bed offer  Expected Discharge Plan and Services In-house Referral: NA Discharge Planning Services: CM Consult Post Acute Care Choice: Skilled Nursing Facility Living arrangements for the past 2 months: Single Family Home                 DME Arranged: N/A DME Agency: NA       HH Arranged: NA HH Agency: NA         Social Determinants of Health (SDOH) Interventions SDOH Screenings   Food Insecurity: No Food Insecurity (04/26/2024)  Housing: Low Risk  (04/26/2024)  Transportation Needs: No Transportation Needs (04/26/2024)  Utilities: Not At Risk (04/26/2024)  Depression (PHQ2-9): Low Risk  (09/20/2023)  Social Connections: Socially Isolated (04/26/2024)  Tobacco Use: Medium Risk (04/25/2024)    Readmission Risk Interventions    05/01/2024   12:09 PM 07/25/2023   12:32 PM 07/04/2023    3:54 PM  Readmission Risk Prevention Plan  Transportation Screening Complete Complete Complete  Medication Review Oceanographer) Complete Complete Complete  PCP or Specialist appointment within 3-5 days of discharge Complete  Complete  HRI or Home Care Consult Complete Complete Complete  SW Recovery Care/Counseling Consult Complete Complete Complete  Palliative Care Screening Not Applicable Not Applicable Not Applicable  Skilled Nursing Facility Complete Not Applicable Not Applicable

## 2024-05-03 NOTE — TOC Transition Note (Signed)
 Transition of Care [facility]) - Discharge Note   Patient Details  Name: Todd Estrada MRN: [id number] Date of Birth: 1961-12-07  Transition of Care [facility]) CM/SW Contact:  Loreda Rodriguez, RN Phone [phone]  05/03/2024, 3:59 PM   Clinical Narrative:    Patient will discharge home with cousin where he was previously living. Patient is wheelchair bound and cousin states that he does not participate in [facility] services when ordered. Cousin states that he does attempt exercises with patient whenever patient allows. Currently there are no TOC needs.      Barriers to Discharge: SNF Pending bed offer   Patient Goals and CMS Choice Patient states their goals for this hospitalization and ongoing recovery are:: rehab CMS Medicare.gov Compare Post Acute Care list provided to:: Patient Represenative (must comment) (cousin [name] per patient request) Choice offered to / list presented to :  (cousin [name]) [place] ownership interest in [facility].provided to:: Patient    Discharge Placement                       Discharge Plan and Services Additional resources added to the After Visit Summary for   In-house Referral: NA Discharge Planning Services: CM Consult Post Acute Care Choice: Skilled Nursing Facility          DME Arranged: N/A DME Agency: NA       HH Arranged: NA HH Agency: NA        Social Drivers of Health (SDOH) Interventions SDOH Screenings   Food Insecurity: No Food Insecurity (04/26/2024)  Housing: Low Risk  (04/26/2024)  Transportation Needs: No Transportation Needs (04/26/2024)  Utilities: Not At Risk (04/26/2024)  Depression (PHQ2-9): Low Risk  (09/20/2023)  Social Connections: Socially Isolated (04/26/2024)  Tobacco Use: Medium Risk (04/25/2024)     Readmission Risk Interventions    05/01/2024   12:09 PM 07/25/2023   12:32 PM 07/04/2023    3:54 PM  Readmission Risk Prevention Plan  Transportation Screening Complete Complete Complete   Medication Review Oceanographer) Complete Complete Complete  PCP or Specialist appointment within 3-5 days of discharge Complete  Complete  HRI or Home Care Consult Complete Complete Complete  SW Recovery Care/Counseling Consult Complete Complete Complete  Palliative Care Screening Not Applicable Not Applicable Not Applicable  Skilled Nursing Facility Complete Not Applicable Not Applicable

## 2024-05-09 ENCOUNTER — Ambulatory Visit (HOSPITAL_COMMUNITY)
Admission: RE | Admit: 2024-05-09 | Discharge: 2024-05-09 | Disposition: A | Discharge status: Home / Self Care | Source: Ambulatory Visit | Attending: Vascular Surgery | Admitting: Vascular Surgery

## 2024-05-09 ENCOUNTER — Ambulatory Visit (INDEPENDENT_AMBULATORY_CARE_PROVIDER_SITE_OTHER): Admitting: Physician Assistant

## 2024-05-09 VITALS — BP 104/66 | HR 98 | Temp 98.6°F

## 2024-05-09 DIAGNOSIS — M7989 Other specified soft tissue disorders: Secondary | ICD-10-CM | POA: Diagnosis present

## 2024-05-09 DIAGNOSIS — I739 Peripheral vascular disease, unspecified: Secondary | ICD-10-CM | POA: Insufficient documentation

## 2024-05-09 LAB — VAS US ABI WITH/WO TBI
Left ABI: 0.45
Right ABI: 0.58

## 2024-05-09 NOTE — Progress Notes (Signed)
 HISTORY AND PHYSICAL     CC:  follow up. Requesting Provider:  Dorthy Gavia, MD  HPI: This is a 63 y.o. male who is here today for follow up for PAD.  Pt has hx of hospitalization for stroke  requiring percutaneous thrombectomy with Dr. Alvira Josephs. .  At the time, he was having CLI and had wounds.  When he was last seen in May 2023 these had healed. Dr. Rosalva Comber deferred his carotid management to Dr. Alvira Josephs as he performed his intervention.  The pt returns today for follow up and accompanied by a family member.  Pt is non verbal.   When asked about pain in his feet, he answers yes to the right foot.  His family member states he has c/o pain in that foot.  When asked, the pain is improved with leg elevation and not hanging it off the side of the bed.  He does not have any ulcerations.  He is on Eliquis .  He has not smoked for a couple of years.  His stroke affected his right side and therefore his right leg is not as active as the left.  He has minimal swelling in the left leg.  He has not worn compression.    The pt is on a statin for cholesterol management.    The pt is not on an aspirin .    Other AC:  Eliquis  The pt is not on medication for hypertension.  The pt is not on medication for diabetes. Tobacco hx:  former    Past Medical History:  Diagnosis Date   Asthma    Atypical chest pain 08/13/2022   Community acquired pneumonia 09/14/2022   COPD (chronic obstructive pulmonary disease) (HCC)    Essential hypertension 08/19/2021   GERD (gastroesophageal reflux disease)    HAP (hospital-acquired pneumonia) 09/16/2022   History of tracheostomy    03/09/22-04/11/22   HLD (hyperlipidemia)    Hypertension    Hypokalemia 08/13/2022   Hypomagnesemia 08/13/2022   Lung cancer (HCC)    PAD (peripheral artery disease) (HCC)    Seizures (HCC) 06/02/2022   Sepsis (HCC) 08/13/2022   Sepsis due to pneumonia (HCC) 04/13/2023   Stroke (HCC) 02/2022    Past Surgical History:  Procedure  Laterality Date   BRONCHIAL BIOPSY  05/30/2022   Procedure: BRONCHIAL BIOPSIES;  Surgeon: Denson Flake, MD;  Location: [facility] ENDOSCOPY;  Service: Pulmonary;;   BRONCHIAL BRUSHINGS  05/30/2022   Procedure: BRONCHIAL BRUSHINGS;  Surgeon: Denson Flake, MD;  Location: [facility] ENDOSCOPY;  Service: Pulmonary;;   BRONCHIAL NEEDLE ASPIRATION BIOPSY  05/30/2022   Procedure: BRONCHIAL NEEDLE ASPIRATION BIOPSIES;  Surgeon: Denson Flake, MD;  Location: [facility] ENDOSCOPY;  Service: Pulmonary;;   BRONCHIAL WASHINGS  05/30/2022   Procedure: BRONCHIAL WASHINGS;  Surgeon: Denson Flake, MD;  Location: [facility] ENDOSCOPY;  Service: Pulmonary;;   CHOLECYSTECTOMY N/A 07/05/2023   Procedure: LAPAROSCOPIC CHOLECYSTECTOMY WITH INTRAOPERATIVE CHOLANGIOGRAM;  Surgeon: Candyce Champagne, MD;  Location: [facility] ORS;  Service: General;  Laterality: N/A;   ERCP N/A 07/06/2023   Procedure: ENDOSCOPIC RETROGRADE CHOLANGIOPANCREATOGRAPHY (ERCP);  Surgeon: Ozell Blunt, MD;  Location: [facility] ENDOSCOPY;  Service: Gastroenterology;  Laterality: N/A;   ESOPHAGOGASTRODUODENOSCOPY (EGD) WITH PROPOFOL  N/A 03/11/2022   Procedure: ESOPHAGOGASTRODUODENOSCOPY (EGD) WITH PROPOFOL ;  Surgeon: Dorena Gander, MD;  Location: [facility] ENDOSCOPY;  Service: General;  Laterality: N/A;   FIDUCIAL MARKER PLACEMENT  05/30/2022   Procedure: FIDUCIAL MARKER PLACEMENT;  Surgeon: Denson Flake, MD;  Location: [facility] ENDOSCOPY;  Service: Pulmonary;;   IR  ANGIO INTRA EXTRACRAN SEL COM CAROTID INNOMINATE UNI R MOD SED  03/02/2022   IR CT HEAD LTD  03/02/2022   IR PERCUTANEOUS ART THROMBECTOMY/INFUSION INTRACRANIAL INC DIAG ANGIO  03/02/2022   PEG PLACEMENT N/A 03/11/2022   Procedure: PERCUTANEOUS ENDOSCOPIC GASTROSTOMY (PEG) PLACEMENT;  Surgeon: Dorena Gander, MD;  Location: [facility] ENDOSCOPY;  Service: General;  Laterality: N/A;   RADIOLOGY WITH ANESTHESIA N/A 03/02/2022   Procedure: IR WITH ANESTHESIA;  Surgeon: Luellen Sages, MD;  Location: [facility] OR;  Service: Radiology;  Laterality: N/A;    REMOVAL OF STONES  07/06/2023   Procedure: REMOVAL OF STONES;  Surgeon: Ozell Blunt, MD;  Location: [facility] ENDOSCOPY;  Service: Gastroenterology;;   [address]  07/06/2023   Procedure: [address];  Surgeon: Ozell Blunt, MD;  Location: [facility] ENDOSCOPY;  Service: Gastroenterology;;   VIDEO BRONCHOSCOPY WITH RADIAL ENDOBRONCHIAL ULTRASOUND  05/30/2022   Procedure: VIDEO BRONCHOSCOPY WITH RADIAL ENDOBRONCHIAL ULTRASOUND;  Surgeon: Denson Flake, MD;  Location: [facility] ENDOSCOPY;  Service: Pulmonary;;    No Known Allergies  Current Outpatient Medications  Medication Sig Dispense Refill   albuterol  (PROVENTIL ) (2.5 MG/3ML) 0.083% nebulizer solution Take 3 mLs (2.5 mg total) by nebulization every 4 (four) hours as needed for wheezing or shortness of breath. 90 mL 12   apixaban  (ELIQUIS ) 5 MG TABS tablet Take 1 tablet (5 mg total) by mouth 2 (two) times daily. 180 tablet 2   atorvastatin  (LIPITOR) 40 MG tablet Take 1 tablet (40 mg total) by mouth daily. 90 tablet 3   chlorpheniramine-HYDROcodone (TUSSIONEX) 10-8 MG/5ML Take 5 mLs by mouth every 12 (twelve) hours as needed for cough. (Patient not taking: Reported on 04/26/2024) 115 mL 0   dextromethorphan -guaiFENesin  (MUCINEX  DM) 30-600 MG 12hr tablet Take 2 tablets by mouth 2 (two) times daily for 7 days. 28 tablet 0   feeding supplement (ENSURE ENLIVE / ENSURE PLUS) LIQD Take 237 mLs by mouth 2 (two) times daily between meals. 237 mL 12   levETIRAcetam  (KEPPRA ) 500 MG tablet Take 1 tablet (500 mg total) by mouth 2 (two) times daily. 180 tablet 0   Multiple Vitamin (MULTIVITAMIN WITH MINERALS) TABS tablet Take 1 tablet by mouth daily.     pantoprazole  (PROTONIX ) 40 MG tablet TAKE 1 TABLET(40 MG) BY MOUTH DAILY (Patient taking differently: Take 40 mg by mouth in the morning.) 90 tablet 0   Tiotropium Bromide-Olodaterol (STIOLTO RESPIMAT ) 2.5-2.5 MCG/ACT AERS NEW PRESCRIPTION REQUEST: STIOLTO 2.5 MCG- INHALE TWO PUFFS BY MOUTH DAILY (Patient taking differently:  Take 2 puffs by mouth daily at 12 noon.) 12 g 3   No current facility-administered medications for this visit.    Family History  Problem Relation Age of Onset   Throat cancer Mother    Liver cancer Father    Kidney failure Sister    Cancer - Lung Paternal Uncle     Social History   Socioeconomic History   Marital status: Widowed    Spouse name: Not on file   Number of children: Not on file   Years of education: Not on file   Highest education level: Not on file  Occupational History   Not on file  Tobacco Use   Smoking status: Former    Current packs/day: 0.00    Types: Cigarettes    Quit date: 05/02/2022    Years since quitting: 2.0    Passive exposure: Never   Smokeless tobacco: Never  Vaping Use   Vaping status: Never Used  Substance and Sexual Activity   Alcohol use: Not  Currently   Drug use: No   Sexual activity: Not Currently  Other Topics Concern   Not on file  Social History Narrative   Not on file   Social Drivers of Health   Financial Resource Strain: Not on file  Food Insecurity: No Food Insecurity (04/26/2024)   Hunger Vital Sign    Worried About Running Out of Food in the Last Year: Never true    Ran Out of Food in the Last Year: Never true  Transportation Needs: No Transportation Needs (04/26/2024)   PRAPARE - Administrator, Civil Service (Medical): No    Lack of Transportation (Non-Medical): No  Physical Activity: Not on file  Stress: Not on file  Social Connections: Socially Isolated (04/26/2024)   Social Connection and Isolation Panel [NHANES]    Frequency of Communication with Friends and Family: Once a week    Frequency of Social Gatherings with Friends and Family: Once a week    Attends Religious Services: Never    Database administrator or Organizations: No    Attends Banker Meetings: Never    Marital Status: Widowed  Intimate Partner Violence: Not At Risk (04/26/2024)   Humiliation, Afraid, Rape, and Kick  questionnaire    Fear of Current or Ex-Partner: No    Emotionally Abused: No    Physically Abused: No    Sexually Abused: No     REVIEW OF SYSTEMS:   [X]  denotes positive finding, [ ]  denotes negative finding Cardiac  Comments:  Chest pain or chest pressure:    Shortness of breath upon exertion:    Short of breath when lying flat:    Irregular heart rhythm:        Vascular    Pain in calf, thigh, or hip brought on by ambulation:    Pain in feet at night that wakes you up from your sleep:     Blood clot in your veins:    Leg swelling:  x       Pulmonary    Oxygen at home:    Productive cough:     Wheezing:         Neurologic    Sudden weakness in arms or legs:     Sudden numbness in arms or legs:     difficulty speaking or slurred speech: x   Temporary loss of vision in one eye:     Problems with dizziness:         Gastrointestinal    Blood in stool:     Vomited blood:         Genitourinary    Burning when urinating:     Blood in urine:        Psychiatric    Major depression:         Hematologic    Bleeding problems:    Problems with blood clotting too easily:        Skin    Rashes or ulcers:        Constitutional    Fever or chills:      PHYSICAL EXAMINATION:  Today's Vitals   05/09/24 1431  BP: 104/66  Pulse: 98  Temp: 98.6 F (37 C)  TempSrc: Temporal  SpO2: 96%  PainSc: 0-No pain   There is no height or weight on file to calculate BMI.   General:  WDWN in NAD; vital signs documented above Gait: Not observed HENT: WNL, normocephalic Pulmonary: normal non-labored breathing , without wheezing Cardiac:  regular HR, without carotid bruits Abdomen: soft, NT; aortic pulse is not palpable Skin: without rashes Vascular Exam/Pulses: Palpable radial pulses bilaterally Pedal pulses are not palpable Bilateral feet are warm with right warmer than left +swelling right leg.  Extremities: without ischemic changes, without Gangrene , without  cellulitis; without open wounds Musculoskeletal: no muscle wasting or atrophy  Neurologic: A&O X 3 Psychiatric:  The pt has Normal affect.   Non-Invasive Vascular Imaging:   ABI's/TBI's on 05/09/2024: Right:  0.58/0.44 (B) - Great toe pressure: 55 Left:  0.45/0.33  (M) - Great toe pressure: 41   Previous ABI's/TBI's on 03/25/2022: Right:  0.51/unable to obtain - Great toe pressure: unable to obtain Left:  0.41/unable to obtain - Great toe pressure:  unablet to obtain     ASSESSMENT/PLAN:: 64 y.o. male here for follow up for PAD with hx of hospitalization for stroke  requiring percutaneous thrombectomy with Dr. Alvira Josephs. .  At the time, he was having CLI and had wounds.  When he was last seen in May 2023 these had healed. Dr. Rosalva Comber deferred his carotid management to Dr. Alvira Josephs as he performed his intervention.   -pt's ABI on the right is slightly improved and the left is essentially unchanged.  He has biphasic flow on the right.  Pt states his pain is improved with leg elevation.  I am not convinced his right foot pain is ischemic rest pain due to the above and his right foot is warm to touch.  He does have swelling in the right leg that improves with elevation.  We measured him for mild knee high compression and he did get a pair today.  I discussed with him to put this on in the morning and take off at night.  Discussed that if this makes his foot hurt, to not wear it.   -continue statin.  He is not on asa due to being on Eliquis .  -will have the pt f/u in short follow up to see if the mild compression helps and will repeat ABI and get a venous reflux study when he returns and have him see Dr. Rosalva Comber to determine if he feels he needs angiogram.  The pt family member knows to call sooner if any issues arises before then.     Maryanna Smart, [facility] [phone]  Clinic MD:   Rosalva Comber

## 2024-05-15 ENCOUNTER — Emergency Department (HOSPITAL_COMMUNITY)
Admission: EM | Admit: 2024-05-15 | Discharge: 2024-05-15 | Disposition: A | Discharge status: Home / Self Care | Attending: Emergency Medicine | Admitting: Emergency Medicine

## 2024-05-15 ENCOUNTER — Encounter (HOSPITAL_COMMUNITY): Payer: Self-pay

## 2024-05-15 ENCOUNTER — Emergency Department (HOSPITAL_COMMUNITY)

## 2024-05-15 ENCOUNTER — Other Ambulatory Visit: Payer: Self-pay | Admitting: Vascular Surgery

## 2024-05-15 ENCOUNTER — Other Ambulatory Visit: Payer: Self-pay

## 2024-05-15 DIAGNOSIS — S8992XA Unspecified injury of left lower leg, initial encounter: Secondary | ICD-10-CM | POA: Diagnosis present

## 2024-05-15 DIAGNOSIS — Z7901 Long term (current) use of anticoagulants: Secondary | ICD-10-CM | POA: Insufficient documentation

## 2024-05-15 DIAGNOSIS — W19XXXA Unspecified fall, initial encounter: Secondary | ICD-10-CM | POA: Diagnosis not present

## 2024-05-15 DIAGNOSIS — S80212A Abrasion, left knee, initial encounter: Secondary | ICD-10-CM | POA: Insufficient documentation

## 2024-05-15 DIAGNOSIS — L0390 Cellulitis, unspecified: Secondary | ICD-10-CM

## 2024-05-15 DIAGNOSIS — R600 Localized edema: Secondary | ICD-10-CM

## 2024-05-15 DIAGNOSIS — S80211A Abrasion, right knee, initial encounter: Secondary | ICD-10-CM | POA: Insufficient documentation

## 2024-05-15 DIAGNOSIS — L03312 Cellulitis of back [any part except buttock]: Secondary | ICD-10-CM | POA: Insufficient documentation

## 2024-05-15 DIAGNOSIS — M7989 Other specified soft tissue disorders: Secondary | ICD-10-CM

## 2024-05-15 DIAGNOSIS — I739 Peripheral vascular disease, unspecified: Secondary | ICD-10-CM

## 2024-05-15 LAB — BASIC METABOLIC PANEL WITH GFR
Anion gap: 10 (ref 5–15)
BUN: 25 mg/dL — ABNORMAL HIGH (ref 8–23)
CO2: 27 mmol/L (ref 22–32)
Calcium: 9.7 mg/dL (ref 8.9–10.3)
Chloride: 103 mmol/L (ref 98–111)
Creatinine, Ser: 1.1 mg/dL (ref 0.61–1.24)
GFR, Estimated: >60 mL/min (ref >60)
Glucose, Bld: 100 mg/dL — ABNORMAL HIGH (ref 70–99)
Potassium: 4.2 mmol/L (ref 3.5–5.1)
Sodium: 140 mmol/L (ref 135–145)

## 2024-05-15 LAB — CBC WITH DIFFERENTIAL/PLATELET
Abs Immature Granulocytes: 0.03 10*3/uL (ref 0.00–0.07)
Basophils Absolute: 0 10*3/uL (ref 0.0–0.1)
Basophils Relative: 0 %
Eosinophils Absolute: 0.1 10*3/uL (ref 0.0–0.5)
Eosinophils Relative: 2 %
HCT: 42.8 % (ref 39.0–52.0)
Hemoglobin: 13.1 g/dL (ref 13.0–17.0)
Immature Granulocytes: 0 %
Lymphocytes Relative: 14 %
Lymphs Abs: 1.1 10*3/uL (ref 0.7–4.0)
MCH: 28.9 pg (ref 26.0–34.0)
MCHC: 30.6 g/dL (ref 30.0–36.0)
MCV: 94.5 fL (ref 80.0–100.0)
Monocytes Absolute: 0.6 10*3/uL (ref 0.1–1.0)
Monocytes Relative: 7 %
Neutro Abs: 6.4 10*3/uL (ref 1.7–7.7)
Neutrophils Relative %: 77 %
Platelets: 278 10*3/uL (ref 150–400)
RBC: 4.53 MIL/uL (ref 4.22–5.81)
RDW: 14.3 % (ref 11.5–15.5)
WBC: 8.3 10*3/uL (ref 4.0–10.5)
nRBC: 0 % (ref 0.0–0.2)

## 2024-05-15 MED ORDER — CLINDAMYCIN HCL 300 MG PO CAPS
300.0000 mg | ORAL_CAPSULE | Freq: Once | ORAL | Status: AC
  Administered 2024-05-15: 300 mg via ORAL
  Filled 2024-05-15: qty 1

## 2024-05-15 MED ORDER — CLINDAMYCIN HCL 300 MG PO CAPS: 300.0000 mg | ORAL_CAPSULE | Freq: Three times a day (TID) | ORAL | 0 refills | Status: DC

## 2024-05-15 MED ORDER — SODIUM CHLORIDE 0.9 % IV BOLUS
1000.0000 mL | Freq: Once | INTRAVENOUS | Status: AC
  Administered 2024-05-15: 1000 mL via INTRAVENOUS

## 2024-05-15 NOTE — ED Triage Notes (Signed)
 Pt brought to ER and dropped off by cousin who he lives with. Pt has severe aphasia and hard to understand related to previous CVA. Pt points to left side back/hip and reports pain. Pt nods head yes to a fall and complains of bilateral knee pain with superficial abrasions noted. Cousin reports pt transfers to bed and toilet but is not ambulatory and is unsure if he fell or not. Pt says no to hitting head. Cousin reports pt has cyst on sacrum as well.

## 2024-05-15 NOTE — ED Provider Notes (Signed)
 [place] EMERGENCY DEPARTMENT AT [facility] Provider Note   CSN: [id number] Arrival date & time: 05/15/24  1857     History  Chief Complaint  Patient presents with   Back Pain   Fall    Todd Estrada is a 63 y.o. male history of previous stroke with aphasia, recent admission for pneumonia here presenting with concern for possible sacral cyst versus abscess and fall.  Patient is from home.  Patient gets taken care of by his cousin.  Cousin noticed that there is a boil in his back and Todd Estrada was concerned that maybe Todd Estrada has an abscess.  Also noted some scrapes of his bilateral knees and concerned that Todd Estrada may have fallen.  Patient is aphasic from previous stroke and unable to give me much history.  Patient has persistent cough and finish course of Augmentin  after Todd Estrada was admitted for pneumonia.  The history is provided by the patient.       Home Medications Prior to Admission medications   Medication Sig Start Date End Date Taking? Authorizing Provider  albuterol  (PROVENTIL ) (2.5 MG/3ML) 0.083% nebulizer solution Take 3 mLs (2.5 mg total) by nebulization every 4 (four) hours as needed for wheezing or shortness of breath. 08/16/22   Vita Grip, MD  apixaban  (ELIQUIS ) 5 MG TABS tablet Take 1 tablet (5 mg total) by mouth 2 (two) times daily. 04/07/24   Dorthy Gavia, MD  atorvastatin  (LIPITOR) 40 MG tablet Take 1 tablet (40 mg total) by mouth daily. 02/06/24 02/05/25  Justus, Michael, MD  chlorpheniramine-HYDROcodone (TUSSIONEX) 10-8 MG/5ML Take 5 mLs by mouth every 12 (twelve) hours as needed for cough. Patient not taking: Reported on 04/26/2024 04/23/24   Jonelle Neri, DO  feeding supplement (ENSURE ENLIVE / ENSURE PLUS) LIQD Take 237 mLs by mouth 2 (two) times daily between meals. 05/03/24   Hoyt Macleod, MD  levETIRAcetam  (KEPPRA ) 500 MG tablet Take 1 tablet (500 mg total) by mouth 2 (two) times daily. 04/18/24   Johny Nap, NP  Multiple Vitamin (MULTIVITAMIN WITH MINERALS)  TABS tablet Take 1 tablet by mouth daily.    [provider]  pantoprazole  (PROTONIX ) 40 MG tablet TAKE 1 TABLET(40 MG) BY MOUTH DAILY Patient taking differently: Take 40 mg by mouth in the morning. 03/04/24   Dorthy Gavia, MD  Tiotropium Bromide-Olodaterol (STIOLTO RESPIMAT ) 2.5-2.5 MCG/ACT AERS NEW PRESCRIPTION REQUEST: STIOLTO 2.5 MCG- INHALE TWO PUFFS BY MOUTH DAILY Patient taking differently: Take 2 puffs by mouth daily at 12 noon. 02/07/24   Dorthy Gavia, MD      Allergies    Patient has no known allergies.    Review of Systems   Review of Systems  Musculoskeletal:  Positive for back pain.  All other systems reviewed and are negative.   Physical Exam Updated Vital Signs BP (!) 143/89   Pulse 93   Temp 98.7 F (37.1 C) (Oral)   Resp 18   SpO2 100%  Physical Exam Vitals and nursing note reviewed.  Constitutional:      Comments: Aphasic (baseline)  HENT:     Head: Normocephalic.     Nose: Nose normal.     Mouth/Throat:     Mouth: Mucous membranes are moist.  Eyes:     Extraocular Movements: Extraocular movements intact.     Pupils: Pupils are equal, round, and reactive to light.  Cardiovascular:     Rate and Rhythm: Normal rate and regular rhythm.     Pulses: Normal pulses.  Heart sounds: Normal heart sounds.  Pulmonary:     Effort: Pulmonary effort is normal.     Breath sounds: Normal breath sounds.  Abdominal:     General: Abdomen is flat.  Genitourinary:    Comments: Patient has a small area of induration and redness of the lower back.  Patient has no obvious fluctuance. Musculoskeletal:        General: Normal range of motion.     Cervical back: Normal range of motion and neck supple.     Comments: Patient has abrasions bilateral knees  Skin:    General: Skin is warm.     Capillary Refill: Capillary refill takes less than 2 seconds.  Neurological:     General: No focal deficit present.     Mental Status: Todd Estrada is alert and oriented to person,  place, and time.  Psychiatric:        Mood and Affect: Mood normal.        Behavior: Behavior normal.     ED Results / Procedures / Treatments   Labs (all labs ordered are listed, but only abnormal results are displayed) Labs Reviewed  BASIC METABOLIC PANEL WITH GFR - Abnormal; Notable for the following components:      Result Value   Glucose, Bld 100 (*)    BUN 25 (*)    All other components within normal limits  CBC WITH DIFFERENTIAL/PLATELET    EKG None  Radiology DG Pelvis 1-2 Views Result Date: 05/15/2024 CLINICAL DATA:  Patient with history of lung cancer presents following fall with polytrauma, knee abrasions and pelvic pain. EXAM: PELVIS - 1-2 VIEW; CHEST - 2 VIEW; LEFT KNEE - COMPLETE 4+ VIEW; RIGHT KNEE - COMPLETE 4+ VIEW COMPARISON:  Portable chest recently 04/25/2024, left knee series 11/19/2023, right knee series 11/19/2023, CT abdomen and pelvis and reconstructions 04/25/2024, 04/01/2024. FINDINGS: Chest AP and lateral exam 9:24 p.m.: Stable post treatment changes with residual scarring or nodule with fiducial marker again noted outer right mid lung. Stable left mid perihilar scar-like opacity also again noted. Remaining lungs clear with COPD change, chronic bronchitis. Low inspiration again noted with slightly elevated left hemidiaphragm Normal cardiomediastinal silhouette with aortic atherosclerosis. No new osseous abnormality is seen. Thoracic spondylosis with slight dextroscoliosis. Four views of the right knee: Lateral view was done cross-table lateral due to patient condition. There is no evidence of fracture, dislocation or joint effusion. Mild osteopenia. There is no evidence of arthropathy or other significant focal bone abnormality. Enthesopathic spurs of the anterior patella are again noted. There is patchy calcific plaque in the superficial femoral artery. Otherwise unremarkable soft tissues. Comparison to the prior study reveals no significant interval change. Four  views of the left knee: Lateral view done cross-table due to patient condition. Mild osteopenia. No evidence of fracture, dislocation or arthritic change. No joint effusion is seen. There is enthesopathic spurring of the anterior patella again noted. There are patchy calcifications in the superficial femoral artery. Soft tissues are otherwise unremarkable. Comparison to the prior study reveals no significant interval change. AP pelvis 1 view obtained in 2 exposures: No pelvic fracture or diastasis is seen. No other focal bone abnormality. There is heavy iliofemoral calcific arteriosclerosis. Comparison to prior study reveals no significant interval change. IMPRESSION: 1. No acute chest findings. COPD and chronic bronchitis. 2. Stable post treatment changes outer right mid lung with fiducial marker. 3. No evidence of fracture or dislocation in the knees. Mild osteopenia. 4. No evidence of pelvic fracture or diastasis.  5. Aortic atherosclerosis. 6. Heavy iliofemoral calcific arteriosclerosis. Electronically Signed   By: Denman Fischer M.D.   On: 05/15/2024 21:45   DG Knee Complete 4 Views Left Result Date: 05/15/2024 CLINICAL DATA:  Patient with history of lung cancer presents following fall with polytrauma, knee abrasions and pelvic pain. EXAM: PELVIS - 1-2 VIEW; CHEST - 2 VIEW; LEFT KNEE - COMPLETE 4+ VIEW; RIGHT KNEE - COMPLETE 4+ VIEW COMPARISON:  Portable chest recently 04/25/2024, left knee series 11/19/2023, right knee series 11/19/2023, CT abdomen and pelvis and reconstructions 04/25/2024, 04/01/2024. FINDINGS: Chest AP and lateral exam 9:24 p.m.: Stable post treatment changes with residual scarring or nodule with fiducial marker again noted outer right mid lung. Stable left mid perihilar scar-like opacity also again noted. Remaining lungs clear with COPD change, chronic bronchitis. Low inspiration again noted with slightly elevated left hemidiaphragm Normal cardiomediastinal silhouette with aortic  atherosclerosis. No new osseous abnormality is seen. Thoracic spondylosis with slight dextroscoliosis. Four views of the right knee: Lateral view was done cross-table lateral due to patient condition. There is no evidence of fracture, dislocation or joint effusion. Mild osteopenia. There is no evidence of arthropathy or other significant focal bone abnormality. Enthesopathic spurs of the anterior patella are again noted. There is patchy calcific plaque in the superficial femoral artery. Otherwise unremarkable soft tissues. Comparison to the prior study reveals no significant interval change. Four views of the left knee: Lateral view done cross-table due to patient condition. Mild osteopenia. No evidence of fracture, dislocation or arthritic change. No joint effusion is seen. There is enthesopathic spurring of the anterior patella again noted. There are patchy calcifications in the superficial femoral artery. Soft tissues are otherwise unremarkable. Comparison to the prior study reveals no significant interval change. AP pelvis 1 view obtained in 2 exposures: No pelvic fracture or diastasis is seen. No other focal bone abnormality. There is heavy iliofemoral calcific arteriosclerosis. Comparison to prior study reveals no significant interval change. IMPRESSION: 1. No acute chest findings. COPD and chronic bronchitis. 2. Stable post treatment changes outer right mid lung with fiducial marker. 3. No evidence of fracture or dislocation in the knees. Mild osteopenia. 4. No evidence of pelvic fracture or diastasis. 5. Aortic atherosclerosis. 6. Heavy iliofemoral calcific arteriosclerosis. Electronically Signed   By: Denman Fischer M.D.   On: 05/15/2024 21:45   DG Knee Complete 4 Views Right Result Date: 05/15/2024 CLINICAL DATA:  Patient with history of lung cancer presents following fall with polytrauma, knee abrasions and pelvic pain. EXAM: PELVIS - 1-2 VIEW; CHEST - 2 VIEW; LEFT KNEE - COMPLETE 4+ VIEW; RIGHT KNEE -  COMPLETE 4+ VIEW COMPARISON:  Portable chest recently 04/25/2024, left knee series 11/19/2023, right knee series 11/19/2023, CT abdomen and pelvis and reconstructions 04/25/2024, 04/01/2024. FINDINGS: Chest AP and lateral exam 9:24 p.m.: Stable post treatment changes with residual scarring or nodule with fiducial marker again noted outer right mid lung. Stable left mid perihilar scar-like opacity also again noted. Remaining lungs clear with COPD change, chronic bronchitis. Low inspiration again noted with slightly elevated left hemidiaphragm Normal cardiomediastinal silhouette with aortic atherosclerosis. No new osseous abnormality is seen. Thoracic spondylosis with slight dextroscoliosis. Four views of the right knee: Lateral view was done cross-table lateral due to patient condition. There is no evidence of fracture, dislocation or joint effusion. Mild osteopenia. There is no evidence of arthropathy or other significant focal bone abnormality. Enthesopathic spurs of the anterior patella are again noted. There is patchy calcific plaque in the superficial  femoral artery. Otherwise unremarkable soft tissues. Comparison to the prior study reveals no significant interval change. Four views of the left knee: Lateral view done cross-table due to patient condition. Mild osteopenia. No evidence of fracture, dislocation or arthritic change. No joint effusion is seen. There is enthesopathic spurring of the anterior patella again noted. There are patchy calcifications in the superficial femoral artery. Soft tissues are otherwise unremarkable. Comparison to the prior study reveals no significant interval change. AP pelvis 1 view obtained in 2 exposures: No pelvic fracture or diastasis is seen. No other focal bone abnormality. There is heavy iliofemoral calcific arteriosclerosis. Comparison to prior study reveals no significant interval change. IMPRESSION: 1. No acute chest findings. COPD and chronic bronchitis. 2. Stable post  treatment changes outer right mid lung with fiducial marker. 3. No evidence of fracture or dislocation in the knees. Mild osteopenia. 4. No evidence of pelvic fracture or diastasis. 5. Aortic atherosclerosis. 6. Heavy iliofemoral calcific arteriosclerosis. Electronically Signed   By: Denman Fischer M.D.   On: 05/15/2024 21:45   DG Chest 2 View Result Date: 05/15/2024 CLINICAL DATA:  Patient with history of lung cancer presents following fall with polytrauma, knee abrasions and pelvic pain. EXAM: PELVIS - 1-2 VIEW; CHEST - 2 VIEW; LEFT KNEE - COMPLETE 4+ VIEW; RIGHT KNEE - COMPLETE 4+ VIEW COMPARISON:  Portable chest recently 04/25/2024, left knee series 11/19/2023, right knee series 11/19/2023, CT abdomen and pelvis and reconstructions 04/25/2024, 04/01/2024. FINDINGS: Chest AP and lateral exam 9:24 p.m.: Stable post treatment changes with residual scarring or nodule with fiducial marker again noted outer right mid lung. Stable left mid perihilar scar-like opacity also again noted. Remaining lungs clear with COPD change, chronic bronchitis. Low inspiration again noted with slightly elevated left hemidiaphragm Normal cardiomediastinal silhouette with aortic atherosclerosis. No new osseous abnormality is seen. Thoracic spondylosis with slight dextroscoliosis. Four views of the right knee: Lateral view was done cross-table lateral due to patient condition. There is no evidence of fracture, dislocation or joint effusion. Mild osteopenia. There is no evidence of arthropathy or other significant focal bone abnormality. Enthesopathic spurs of the anterior patella are again noted. There is patchy calcific plaque in the superficial femoral artery. Otherwise unremarkable soft tissues. Comparison to the prior study reveals no significant interval change. Four views of the left knee: Lateral view done cross-table due to patient condition. Mild osteopenia. No evidence of fracture, dislocation or arthritic change. No joint  effusion is seen. There is enthesopathic spurring of the anterior patella again noted. There are patchy calcifications in the superficial femoral artery. Soft tissues are otherwise unremarkable. Comparison to the prior study reveals no significant interval change. AP pelvis 1 view obtained in 2 exposures: No pelvic fracture or diastasis is seen. No other focal bone abnormality. There is heavy iliofemoral calcific arteriosclerosis. Comparison to prior study reveals no significant interval change. IMPRESSION: 1. No acute chest findings. COPD and chronic bronchitis. 2. Stable post treatment changes outer right mid lung with fiducial marker. 3. No evidence of fracture or dislocation in the knees. Mild osteopenia. 4. No evidence of pelvic fracture or diastasis. 5. Aortic atherosclerosis. 6. Heavy iliofemoral calcific arteriosclerosis. Electronically Signed   By: Denman Fischer M.D.   On: 05/15/2024 21:45    Procedures Procedures    Medications Ordered in ED Medications  clindamycin (CLEOCIN) capsule 300 mg (has no administration in time range)  sodium chloride  0.9 % bolus 1,000 mL (1,000 mLs Intravenous New Bag/Given 05/15/24 2102)    ED Course/  Medical Decision Making/ A&P                                 Medical Decision Making BLAS RICHES is a 63 y.o. male here presenting with possible fall and also small cellulitis of the lower back.  Patient was recently admitted for pneumonia.  Will check CBC and BMP and get pelvis and bilateral knee x-rays  11:06 PM White blood cell count is normal.  Chest x-ray and pelvis and knee x-rays were unremarkable.  Will prescribe a course of clindamycin.  Patient is stable for discharge   Problems Addressed: Cellulitis, unspecified cellulitis site: acute illness or injury  Amount and/or Complexity of Data Reviewed Labs: ordered. Decision-making details documented in ED Course. Radiology: ordered and independent interpretation performed. Decision-making  details documented in ED Course. ECG/medicine tests: ordered.  Risk Prescription drug management.    Final Clinical Impression(s) / ED Diagnoses Final diagnoses:  None    Rx / DC Orders ED Discharge Orders     None         Dalene Duck, MD 05/15/24 2307

## 2024-05-15 NOTE — Discharge Instructions (Addendum)
 As we discussed, your labs are normal today.  Your x-rays did not show any fractures  I have prescribed clindamycin 300 mg 3 times daily for a week  See your doctor  Return to ER if you have fever or another fall or severe pain

## 2024-05-24 ENCOUNTER — Encounter: Payer: Self-pay | Admitting: *Deleted

## 2024-05-25 ENCOUNTER — Encounter (HOSPITAL_COMMUNITY): Payer: Self-pay

## 2024-05-25 ENCOUNTER — Emergency Department (HOSPITAL_COMMUNITY)
Admission: EM | Admit: 2024-05-25 | Discharge: 2024-05-26 | Disposition: A | Discharge status: Home / Self Care | Attending: Emergency Medicine | Admitting: Emergency Medicine

## 2024-05-25 DIAGNOSIS — J449 Chronic obstructive pulmonary disease, unspecified: Secondary | ICD-10-CM | POA: Insufficient documentation

## 2024-05-25 DIAGNOSIS — R1013 Epigastric pain: Secondary | ICD-10-CM | POA: Insufficient documentation

## 2024-05-25 DIAGNOSIS — Z7901 Long term (current) use of anticoagulants: Secondary | ICD-10-CM | POA: Insufficient documentation

## 2024-05-25 DIAGNOSIS — I10 Essential (primary) hypertension: Secondary | ICD-10-CM | POA: Diagnosis not present

## 2024-05-25 LAB — COMPREHENSIVE METABOLIC PANEL WITH GFR
ALT: 15 U/L (ref 0–44)
AST: 20 U/L (ref 15–41)
Albumin: 4.1 g/dL (ref 3.5–5.0)
Alkaline Phosphatase: 91 U/L (ref 38–126)
Anion gap: 8 (ref 5–15)
BUN: 16 mg/dL (ref 8–23)
CO2: 27 mmol/L (ref 22–32)
Calcium: 9 mg/dL (ref 8.9–10.3)
Chloride: 104 mmol/L (ref 98–111)
Creatinine, Ser: 0.93 mg/dL (ref 0.61–1.24)
GFR, Estimated: >60 mL/min (ref >60)
Glucose, Bld: 109 mg/dL — ABNORMAL HIGH (ref 70–99)
Potassium: 4 mmol/L (ref 3.5–5.1)
Sodium: 139 mmol/L (ref 135–145)
Total Bilirubin: 0.9 mg/dL (ref 0.0–1.2)
Total Protein: 8 g/dL (ref 6.5–8.1)

## 2024-05-25 LAB — CBC
HCT: 42.9 % (ref 39.0–52.0)
Hemoglobin: 12.9 g/dL — ABNORMAL LOW (ref 13.0–17.0)
MCH: 28.9 pg (ref 26.0–34.0)
MCHC: 30.1 g/dL (ref 30.0–36.0)
MCV: 96.2 fL (ref 80.0–100.0)
Platelets: 146 10*3/uL — ABNORMAL LOW (ref 150–400)
RBC: 4.46 MIL/uL (ref 4.22–5.81)
RDW: 14.6 % (ref 11.5–15.5)
WBC: 4.6 10*3/uL (ref 4.0–10.5)
nRBC: 0 % (ref 0.0–0.2)

## 2024-05-25 LAB — LIPASE, BLOOD: Lipase: 29 U/L (ref 11–51)

## 2024-05-25 NOTE — ED Triage Notes (Addendum)
 Patient was brought back from the lobby. Patient is sitting in a wheel chair. Patient only mumbles. Does not speak any words. Called cousin listed in the patient chart-no answer. Complaint from registration is abdominal pain. Patient shakes  his head to heart burn and abdominal pain.

## 2024-05-26 ENCOUNTER — Emergency Department (HOSPITAL_COMMUNITY)

## 2024-05-26 LAB — URINALYSIS, ROUTINE W REFLEX MICROSCOPIC
Bilirubin Urine: NEGATIVE
Glucose, UA: NEGATIVE mg/dL
Hgb urine dipstick: NEGATIVE
Ketones, ur: NEGATIVE mg/dL
Leukocytes,Ua: NEGATIVE
Nitrite: NEGATIVE
Protein, ur: NEGATIVE mg/dL
Specific Gravity, Urine: 1.02 (ref 1.005–1.030)
pH: 5 (ref 5.0–8.0)

## 2024-05-26 LAB — TROPONIN I (HIGH SENSITIVITY): Troponin I (High Sensitivity): 4 ng/L (ref <18)

## 2024-05-26 MED ORDER — ALUM & MAG HYDROXIDE-SIMETH 200-200-20 MG/5ML PO SUSP
30.0000 mL | Freq: Once | ORAL | Status: AC
  Administered 2024-05-26: 30 mL via ORAL
  Filled 2024-05-26: qty 30

## 2024-05-26 NOTE — ED Notes (Signed)
 In and out catheter performed, was drained from the bladder

## 2024-05-26 NOTE — Discharge Instructions (Addendum)
 Labs and imaging without acute findings today.  No signs of infection. Continue taking protonix  to help with heartburn.  Can use TUMS, maalox, rolaids, etc for more immediate relief if needed. Follow-up with your doctor. Return here for new concerns.

## 2024-05-26 NOTE — ED Provider Notes (Signed)
 [place] EMERGENCY DEPARTMENT AT [facility] Provider Note   CSN: [id number] Arrival date & time: 05/25/24  1902     History  Chief Complaint  Patient presents with   Abdominal Pain    YOVANI COGBURN is a 63 y.o. male.  The history is provided by the patient and medical records.  Abdominal Pain  63 year old male with history of hypertension, hyperlipidemia, peripheral artery disease, seizures, prior stroke with severe expressive aphasia, COPD, GERD, presenting to the ED with chest/abdominal pain.  History is very difficult to ascertain due to his level of aphasia.  He is able to shake head yes and no to simple questioning.  He points to his epigastric and midsternal region as area of pain.  Does indicate that this feels like "burning".  He has not had any vomiting.  No fever or chills.  No diarrhea.  Home Medications Prior to Admission medications   Medication Sig Start Date End Date Taking? Authorizing Provider  albuterol  (PROVENTIL ) (2.5 MG/3ML) 0.083% nebulizer solution Take 3 mLs (2.5 mg total) by nebulization every 4 (four) hours as needed for wheezing or shortness of breath. 08/16/22   Vita Grip, MD  apixaban  (ELIQUIS ) 5 MG TABS tablet Take 1 tablet (5 mg total) by mouth 2 (two) times daily. 04/07/24   Dorthy Gavia, MD  atorvastatin  (LIPITOR) 40 MG tablet Take 1 tablet (40 mg total) by mouth daily. 02/06/24 02/05/25  Justus, Michael, MD  chlorpheniramine-HYDROcodone (TUSSIONEX) 10-8 MG/5ML Take 5 mLs by mouth every 12 (twelve) hours as needed for cough. Patient not taking: Reported on 04/26/2024 04/23/24   Jonelle Neri, DO  clindamycin  (CLEOCIN ) 300 MG capsule Take 1 capsule (300 mg total) by mouth 3 (three) times daily. 05/15/24   Dalene Duck, MD  feeding supplement (ENSURE ENLIVE / ENSURE PLUS) LIQD Take 237 mLs by mouth 2 (two) times daily between meals. 05/03/24   Hoyt Macleod, MD  levETIRAcetam  (KEPPRA ) 500 MG tablet Take 1 tablet (500 mg total) by  mouth 2 (two) times daily. 04/18/24   Johny Nap, NP  Multiple Vitamin (MULTIVITAMIN WITH MINERALS) TABS tablet Take 1 tablet by mouth daily.    [provider]  pantoprazole  (PROTONIX ) 40 MG tablet TAKE 1 TABLET(40 MG) BY MOUTH DAILY Patient taking differently: Take 40 mg by mouth in the morning. 03/04/24   Dorthy Gavia, MD  Tiotropium Bromide-Olodaterol (STIOLTO RESPIMAT ) 2.5-2.5 MCG/ACT AERS NEW PRESCRIPTION REQUEST: STIOLTO 2.5 MCG- INHALE TWO PUFFS BY MOUTH DAILY Patient taking differently: Take 2 puffs by mouth daily at 12 noon. 02/07/24   Dorthy Gavia, MD      Allergies    Patient has no known allergies.    Review of Systems   Review of Systems  Gastrointestinal:  Positive for abdominal pain.  All other systems reviewed and are negative.   Physical Exam Updated Vital Signs BP 138/72 (BP Location: Left Arm)   Pulse 69   Temp 98.3 F (36.8 C) (Oral)   Resp 18   SpO2 100%   Physical Exam Vitals and nursing note reviewed.  Constitutional:      Appearance: He is well-developed.  HENT:     Head: Normocephalic and atraumatic.  Eyes:     Conjunctiva/sclera: Conjunctivae normal.     Pupils: Pupils are equal, round, and reactive to light.  Cardiovascular:     Rate and Rhythm: Normal rate and regular rhythm.     Heart sounds: Normal heart sounds.  Pulmonary:     Effort: Pulmonary  effort is normal.     Breath sounds: Normal breath sounds.     Comments: Lungs CTAB Chest:     Comments: Chest wall non-tender to palpation, no bruising or deformities Abdominal:     General: Bowel sounds are normal.     Palpations: Abdomen is soft.     Tenderness: There is no abdominal tenderness. There is no rebound.     Comments: Abdomen non-tender to palpation, no distention, normal bowel sounds  Musculoskeletal:        General: Normal range of motion.     Cervical back: Normal range of motion.  Skin:    General: Skin is warm and dry.  Neurological:     Mental Status: He  is alert and oriented to person, place, and time.     ED Results / Procedures / Treatments   Labs (all labs ordered are listed, but only abnormal results are displayed) Labs Reviewed  COMPREHENSIVE METABOLIC PANEL WITH GFR - Abnormal; Notable for the following components:      Result Value   Glucose, Bld 109 (*)    All other components within normal limits  CBC - Abnormal; Notable for the following components:   Hemoglobin 12.9 (*)    Platelets 146 (*)    All other components within normal limits  LIPASE, BLOOD  URINALYSIS, ROUTINE W REFLEX MICROSCOPIC  TROPONIN I (HIGH SENSITIVITY)    EKG EKG Interpretation Date/Time:  Saturday May 25 2024 20:19:09 EDT Ventricular Rate:  66 PR Interval:  161 QRS Duration:  92 QT Interval:  381 QTC Calculation: 400 R Axis:   71  Text Interpretation: Sinus rhythm Ventricular premature complex Confirmed by Iva Mariner [id number]) on 05/26/2024 12:17:10 AM  Radiology DG Chest Port 1 View Result Date: 05/26/2024 CLINICAL DATA:  Chest pain EXAM: PORTABLE CHEST 1 VIEW COMPARISON:  05/15/2024 FINDINGS: Cardiac shadow is stable. Persistent spiculated density is noted in the right upper lobe with fiducial marker stable from the prior exam. Linear scarring remains in the left mid lung. No new focal infiltrate or effusion is seen. No bony abnormality is noted. IMPRESSION: Chronic changes similar to that seen on the prior exam. Electronically Signed   By: Violeta Grey M.D.   On: 05/26/2024 01:55    Procedures Procedures    Medications Ordered in ED Medications  alum & mag hydroxide-simeth (MAALOX/MYLANTA) 200-200-20 MG/5ML suspension 30 mL (30 mLs Oral Given 05/26/24 0124)    ED Course/ Medical Decision Making/ A&P                                 Medical Decision Making Amount and/or Complexity of Data Reviewed Labs: ordered. Radiology: ordered and independent interpretation performed. ECG/medicine tests: ordered and independent interpretation  performed.  Risk OTC drugs.   63 year old male presenting to the ED with what seems to be chest and abdominal pain.  History is very difficult as he is incredibly aphasic from prior stroke.  Points to chest and epigastric area.  Nods his head that it is "burning".  He is non-toxic in appearance, NAD.  He does not have any tenderness on exam, lungs are clear.    EKG is sinus rhythm, no acute ischemia.  Labs without leukocytosis or electrolyte derangement.  Lipase WNL.  Troponin negative.  CXR clear-- chronic findings, similar to prior.  Patient did have difficulty providing UA here-- indicated somewhat painful?  Had 347 on bladder scan, in and out  performed which relieved his symptoms.  UA without signs of infection.  GI cocktail seemed to resolve burning sensation.  Tolerating PO without difficulty.  Multiple CT scans in the past few months without acute findings.  Most recent was just 10 days ago.  As he appears quite comfortable now and remains hemodynamically stable, do not feel this emergently needs to be repeated today.  Appears stable for discharge.  Family called to come pick him up.  Encouraged to continue home Protonix  for now.  Follow-up closely with PCP.  Return here for new concerns.  Final Clinical Impression(s) / ED Diagnoses Final diagnoses:  Epigastric pain    Rx / DC Orders ED Discharge Orders     None         Coretha Dew, PA-C 05/26/24 0531    Iva Mariner, MD 05/26/24 [phone]

## 2024-05-28 NOTE — Progress Notes (Signed)
 Cardiology Office Note:  .   Date:  06/10/2024  ID:  Todd Estrada, DOB [birth year], MRN [id number] PCP: [name], [name], DO  [facility] Providers Cardiologist:  Stanly DELENA Leavens, MD    History of Present Illness: .   Todd Estrada is a 63 y.o. male   with a hx of HTN, hypotension, coronary calcification, CVA (right-sided deficit and aphasia), PAD, PE (already on Eliquis ), documented history of lung cancer.  We saw in the hospital 04/2024 with Afib with RVR spontaneously converted to NSR. No AV nodal blocking agents due to soft BPs. Already on eliquis . In ED 06/04/24 with chest pain and sob. Torponin and bnp normal. Also in ED 6/8 abdominal pain, 6/14 flank pain, 6/21 abdominal pain-w/u's reassuring. EKG NSR 06/01/24.  Patient here with his cousin. His caregiver is on the phone. BP up and down at home 105/65-135/88. No chest pain or palpitations edema. On naprosyn  for 3 months and high salt diet with frozen dinners, canned soups etc. Complaining of knee and leg pain.   ROS:    Studies Reviewed: SABRA         Prior CV Studies:    Echo July 2024 1. Left ventricular ejection fraction, by estimation, is 60 to 65%. The  left ventricle has normal function. Left ventricular endocardial border  not optimally defined to evaluate regional wall motion. There is mild left  ventricular hypertrophy. Left  ventricular diastolic parameters are consistent with Grade I diastolic  dysfunction (impaired relaxation).   2. Right ventricular systolic function is mildly reduced. The right  ventricular size is normal.   3. The mitral valve is normal in structure. Trivial mitral valve  regurgitation. No evidence of mitral stenosis.   4. The aortic valve was not well visualized. Aortic valve regurgitation  is not visualized. No aortic stenosis is present.   5. Aortic dilatation noted. There is borderline dilatation of the  ascending aorta, measuring 39 mm.   6. The inferior vena cava is normal in  size with greater than 50%  respiratory variability, suggesting right atrial pressure of 3 mmHg.   7. Technically very limited study with poor images even with Definity   contrast. LV function appears normal. Suspect RV function mildly  decreased.   Risk Assessment/Calculations:    CHA2DS2-VASc Score = 3   This indicates a 3.2% annual risk of stroke. The patient's score is based upon: CHF History: 0 HTN History: 0 Diabetes History: 0 Stroke History: 2 Vascular Disease History: 1 Age Score: 0 Gender Score: 0            Physical Exam:   VS:  BP 138/75   Ht 6' 1 (1.854 m)   Wt 193 lb (87.5 kg)   SpO2 98%   BMI 25.46 kg/m    Wt Readings from Last 3 Encounters:  06/10/24 193 lb (87.5 kg)  06/04/24 205 lb (93 kg)  04/26/24 200 lb 6.4 oz (90.9 kg)    GEN: Well nourished, well developed in no acute distress NECK: No JVD; No carotid bruits CARDIAC:  RRR, no murmurs, rubs, gallops RESPIRATORY:  Clear to auscultation without rales, wheezing or rhonchi  ABDOMEN: Soft, non-tender, non-distended EXTREMITIES:  trace right lower ext  edema-wearing a brace that may be too tight below his knee ; No deformity   ASSESSMENT AND PLAN: .   Paroxysmal atrial fibrillation: 04/2024 episode of A-fib with RVR and based on EMR spontaneously converted to sinus rhythm. Currently not on AV nodal blocking agents  due to soft blood pressures. Not an ideal candidate for antiarrhythmic medications. Currently on Eliquis  given his history of PE Has had history of stroke in the past. -NSR on EKG 06/01/24   Coronary calcification: Asymptomatic.  Would avoid aspirin  as long as he is on anticoagulation.  No workup indicated in an asymptomatic male for now.   Chronic hypotension: of midodrine . BP up initially here but came down and at home have been stable.   Hyperlipidemia: Continue statin therapy   History of COPD On inhalers   History of stroke:on eliquis            Dispo: f/u 6  months  Signed, Olivia Pavy, PA-C

## 2024-06-01 ENCOUNTER — Other Ambulatory Visit: Payer: Self-pay

## 2024-06-01 ENCOUNTER — Encounter (HOSPITAL_COMMUNITY): Payer: Self-pay

## 2024-06-01 ENCOUNTER — Emergency Department (HOSPITAL_COMMUNITY)
Admission: EM | Admit: 2024-06-01 | Discharge: 2024-06-01 | Disposition: A | Discharge status: Home / Self Care | Attending: Emergency Medicine | Admitting: Emergency Medicine

## 2024-06-01 ENCOUNTER — Emergency Department (HOSPITAL_COMMUNITY)

## 2024-06-01 DIAGNOSIS — I2510 Atherosclerotic heart disease of native coronary artery without angina pectoris: Secondary | ICD-10-CM | POA: Diagnosis not present

## 2024-06-01 DIAGNOSIS — R1084 Generalized abdominal pain: Secondary | ICD-10-CM | POA: Diagnosis not present

## 2024-06-01 DIAGNOSIS — Z7951 Long term (current) use of inhaled steroids: Secondary | ICD-10-CM | POA: Insufficient documentation

## 2024-06-01 DIAGNOSIS — R4701 Aphasia: Secondary | ICD-10-CM | POA: Diagnosis not present

## 2024-06-01 DIAGNOSIS — I509 Heart failure, unspecified: Secondary | ICD-10-CM | POA: Insufficient documentation

## 2024-06-01 DIAGNOSIS — R0789 Other chest pain: Secondary | ICD-10-CM | POA: Diagnosis present

## 2024-06-01 DIAGNOSIS — Z7901 Long term (current) use of anticoagulants: Secondary | ICD-10-CM | POA: Insufficient documentation

## 2024-06-01 LAB — CBC
HCT: 41.5 % (ref 39.0–52.0)
Hemoglobin: 12.8 g/dL — ABNORMAL LOW (ref 13.0–17.0)
MCH: 29.5 pg (ref 26.0–34.0)
MCHC: 30.8 g/dL (ref 30.0–36.0)
MCV: 95.6 fL (ref 80.0–100.0)
Platelets: 161 10*3/uL (ref 150–400)
RBC: 4.34 MIL/uL (ref 4.22–5.81)
RDW: 14.3 % (ref 11.5–15.5)
WBC: 5.1 10*3/uL (ref 4.0–10.5)
nRBC: 0 % (ref 0.0–0.2)

## 2024-06-01 LAB — URINALYSIS, ROUTINE W REFLEX MICROSCOPIC
Bilirubin Urine: NEGATIVE
Glucose, UA: NEGATIVE mg/dL
Hgb urine dipstick: NEGATIVE
Ketones, ur: NEGATIVE mg/dL
Leukocytes,Ua: NEGATIVE
Nitrite: NEGATIVE
Protein, ur: NEGATIVE mg/dL
Specific Gravity, Urine: 1.009 (ref 1.005–1.030)
pH: 5 (ref 5.0–8.0)

## 2024-06-01 LAB — COMPREHENSIVE METABOLIC PANEL WITH GFR
ALT: 13 U/L (ref 0–44)
AST: 21 U/L (ref 15–41)
Albumin: 4.1 g/dL (ref 3.5–5.0)
Alkaline Phosphatase: 84 U/L (ref 38–126)
Anion gap: 11 (ref 5–15)
BUN: 17 mg/dL (ref 8–23)
CO2: 25 mmol/L (ref 22–32)
Calcium: 9.2 mg/dL (ref 8.9–10.3)
Chloride: 102 mmol/L (ref 98–111)
Creatinine, Ser: 0.94 mg/dL (ref 0.61–1.24)
GFR, Estimated: >60 mL/min (ref >60)
Glucose, Bld: 97 mg/dL (ref 70–99)
Potassium: 3.7 mmol/L (ref 3.5–5.1)
Sodium: 138 mmol/L (ref 135–145)
Total Bilirubin: 0.8 mg/dL (ref 0.0–1.2)
Total Protein: 7.5 g/dL (ref 6.5–8.1)

## 2024-06-01 LAB — LIPASE, BLOOD: Lipase: 31 U/L (ref 11–51)

## 2024-06-01 MED ORDER — NAPROXEN 500 MG PO TABS
500.0000 mg | ORAL_TABLET | Freq: Once | ORAL | Status: AC
  Administered 2024-06-01: 500 mg via ORAL
  Filled 2024-06-01: qty 1

## 2024-06-01 MED ORDER — OXYCODONE-ACETAMINOPHEN 5-325 MG PO TABS
1.0000 | ORAL_TABLET | Freq: Once | ORAL | Status: AC
  Administered 2024-06-01: 1 via ORAL
  Filled 2024-06-01: qty 1

## 2024-06-01 MED ORDER — ALBUTEROL SULFATE (2.5 MG/3ML) 0.083% IN NEBU
5.0000 mg | INHALATION_SOLUTION | RESPIRATORY_TRACT | Status: AC
  Administered 2024-06-01 (×3): 5 mg via RESPIRATORY_TRACT
  Filled 2024-06-01 (×2): qty 6

## 2024-06-01 MED ORDER — NAPROXEN 500 MG PO TABS: 500.0000 mg | ORAL_TABLET | Freq: Two times a day (BID) | ORAL | 0 refills | Status: DC

## 2024-06-01 NOTE — ED Triage Notes (Signed)
 Pt reports right flank pain into right hip that started today. Pt hard to understand due to aphasia from previous stroke. Pt denies urinary symptoms.

## 2024-06-01 NOTE — ED Provider Notes (Signed)
 [place] EMERGENCY DEPARTMENT AT [facility] Provider Note   CSN: [id number] Arrival date & time: 06/01/24  1900     Patient presents with: Flank Pain   Todd Estrada is a 63 y.o. male.  {Add pertinent medical, surgical, social history, OB history to HPI:32981} HPI    63 year old patient  Prior to Admission medications   Medication Sig Start Date End Date Taking? Authorizing Provider  naproxen (NAPROSYN) 500 MG tablet Take 1 tablet (500 mg total) by mouth 2 (two) times daily. 06/01/24  Yes Deatra Face, MD  albuterol  (PROVENTIL ) (2.5 MG/3ML) 0.083% nebulizer solution Take 3 mLs (2.5 mg total) by nebulization every 4 (four) hours as needed for wheezing or shortness of breath. 08/16/22   Vita Grip, MD  apixaban  (ELIQUIS ) 5 MG TABS tablet Take 1 tablet (5 mg total) by mouth 2 (two) times daily. 04/07/24   Dorthy Gavia, MD  atorvastatin  (LIPITOR) 40 MG tablet Take 1 tablet (40 mg total) by mouth daily. 02/06/24 02/05/25  Justus, Michael, MD  chlorpheniramine-HYDROcodone (TUSSIONEX) 10-8 MG/5ML Take 5 mLs by mouth every 12 (twelve) hours as needed for cough. Patient not taking: Reported on 04/26/2024 04/23/24   Jonelle Neri, DO  clindamycin  (CLEOCIN ) 300 MG capsule Take 1 capsule (300 mg total) by mouth 3 (three) times daily. 05/15/24   Dalene Duck, MD  feeding supplement (ENSURE ENLIVE / ENSURE PLUS) LIQD Take 237 mLs by mouth 2 (two) times daily between meals. 05/03/24   Hoyt Macleod, MD  levETIRAcetam  (KEPPRA ) 500 MG tablet Take 1 tablet (500 mg total) by mouth 2 (two) times daily. 04/18/24   Johny Nap, NP  Multiple Vitamin (MULTIVITAMIN WITH MINERALS) TABS tablet Take 1 tablet by mouth daily.    [provider]  pantoprazole  (PROTONIX ) 40 MG tablet TAKE 1 TABLET(40 MG) BY MOUTH DAILY Patient taking differently: Take 40 mg by mouth in the morning. 03/04/24   Dorthy Gavia, MD  Tiotropium Bromide-Olodaterol (STIOLTO RESPIMAT ) 2.5-2.5 MCG/ACT AERS  NEW PRESCRIPTION REQUEST: STIOLTO 2.5 MCG- INHALE TWO PUFFS BY MOUTH DAILY Patient taking differently: Take 2 puffs by mouth daily at 12 noon. 02/07/24   Dorthy Gavia, MD    Allergies: Patient has no known allergies.    Review of Systems  Updated Vital Signs BP (!) 151/84   Pulse 66   Temp 99 F (37.2 C)   Resp 18   SpO2 100%   Physical Exam  (all labs ordered are listed, but only abnormal results are displayed) Labs Reviewed  CBC - Abnormal; Notable for the following components:      Result Value   Hemoglobin 12.8 (*)    All other components within normal limits  URINALYSIS, ROUTINE W REFLEX MICROSCOPIC - Abnormal; Notable for the following components:   Color, Urine STRAW (*)    All other components within normal limits  LIPASE, BLOOD  COMPREHENSIVE METABOLIC PANEL WITH GFR    EKG: None  Radiology: DG Chest 2 View Result Date: 06/01/2024 CLINICAL DATA:  Right-sided chest pain, recent pneumonia EXAM: CHEST - 2 VIEW COMPARISON:  05/26/2024 FINDINGS: Frontal and lateral views of the chest demonstrate an unremarkable cardiac silhouette. Stable areas of scarring within the lateral right midlung zone and left perihilar region. No acute airspace disease, effusion, or pneumothorax. Stable background emphysema. No acute bony abnormalities. IMPRESSION: 1. Stable emphysema and bilateral scarring. No acute airspace disease. Electronically Signed   By: Bobbye Burrow M.D.   On: 06/01/2024 20:28    {Document cardiac monitor,  telemetry assessment procedure when appropriate:32947} Procedures   Medications Ordered in the ED  naproxen (NAPROSYN) tablet 500 mg (500 mg Oral Given 06/01/24 2024)  albuterol  (PROVENTIL ) (2.5 MG/3ML) 0.083% nebulizer solution 5 mg (5 mg Nebulization Given 06/01/24 2052)  oxyCODONE -acetaminophen  (PERCOCET/ROXICET) 5-325 MG per tablet 1 tablet (1 tablet Oral Given 06/01/24 2024)      {Click here for ABCD2, HEART and other calculators REFRESH Note before  signing:1}                              Medical Decision Making Amount and/or Complexity of Data Reviewed Labs: ordered. Radiology: ordered.  Risk Prescription drug management.   ***  {Document critical care time when appropriate  Document review of labs and clinical decision tools ie CHADS2VASC2, etc  Document your independent review of radiology images and any outside records  Document your discussion with family members, caretakers and with consultants  Document social determinants of health affecting pt's care  Document your decision making why or why not admission, treatments were needed:32947:::1}   Final diagnoses:  Right-sided chest wall pain    ED Discharge Orders          Ordered    naproxen (NAPROSYN) 500 MG tablet  2 times daily        06/01/24 2202

## 2024-06-01 NOTE — Discharge Instructions (Signed)
 We saw you in the ER for right-sided abdominal and chest pain. All the results in the ER are normal, labs and imaging. We are not sure what is causing your symptoms. The workup in the ER is not complete, and is limited to screening for life threatening and emergent conditions only, so please see a primary care doctor for further evaluation.

## 2024-06-04 ENCOUNTER — Emergency Department (HOSPITAL_COMMUNITY)
Admission: EM | Admit: 2024-06-04 | Discharge: 2024-06-04 | Disposition: A | Discharge status: Home / Self Care | Attending: Emergency Medicine | Admitting: Emergency Medicine

## 2024-06-04 ENCOUNTER — Other Ambulatory Visit: Payer: Self-pay

## 2024-06-04 ENCOUNTER — Emergency Department (HOSPITAL_COMMUNITY)

## 2024-06-04 DIAGNOSIS — R0602 Shortness of breath: Secondary | ICD-10-CM | POA: Insufficient documentation

## 2024-06-04 DIAGNOSIS — R079 Chest pain, unspecified: Secondary | ICD-10-CM | POA: Insufficient documentation

## 2024-06-04 DIAGNOSIS — M7989 Other specified soft tissue disorders: Secondary | ICD-10-CM | POA: Diagnosis not present

## 2024-06-04 DIAGNOSIS — Z7901 Long term (current) use of anticoagulants: Secondary | ICD-10-CM | POA: Diagnosis not present

## 2024-06-04 DIAGNOSIS — Z8673 Personal history of transient ischemic attack (TIA), and cerebral infarction without residual deficits: Secondary | ICD-10-CM | POA: Diagnosis not present

## 2024-06-04 LAB — CBC
HCT: 38.5 % — ABNORMAL LOW (ref 39.0–52.0)
Hemoglobin: 11.8 g/dL — ABNORMAL LOW (ref 13.0–17.0)
MCH: 29.3 pg (ref 26.0–34.0)
MCHC: 30.6 g/dL (ref 30.0–36.0)
MCV: 95.5 fL (ref 80.0–100.0)
Platelets: 160 10*3/uL (ref 150–400)
RBC: 4.03 MIL/uL — ABNORMAL LOW (ref 4.22–5.81)
RDW: 14.5 % (ref 11.5–15.5)
WBC: 4.6 10*3/uL (ref 4.0–10.5)
nRBC: 0 % (ref 0.0–0.2)

## 2024-06-04 LAB — TROPONIN I (HIGH SENSITIVITY)
Troponin I (High Sensitivity): 3 ng/L (ref <18)
Troponin I (High Sensitivity): 4 ng/L (ref <18)

## 2024-06-04 LAB — BASIC METABOLIC PANEL WITH GFR
Anion gap: 8 (ref 5–15)
BUN: 24 mg/dL — ABNORMAL HIGH (ref 8–23)
CO2: 27 mmol/L (ref 22–32)
Calcium: 8.9 mg/dL (ref 8.9–10.3)
Chloride: 106 mmol/L (ref 98–111)
Creatinine, Ser: 1.14 mg/dL (ref 0.61–1.24)
GFR, Estimated: >60 mL/min (ref >60)
Glucose, Bld: 104 mg/dL — ABNORMAL HIGH (ref 70–99)
Potassium: 4.2 mmol/L (ref 3.5–5.1)
Sodium: 141 mmol/L (ref 135–145)

## 2024-06-04 LAB — BRAIN NATRIURETIC PEPTIDE: B Natriuretic Peptide: 33.5 pg/mL (ref 0.0–100.0)

## 2024-06-04 MED ORDER — IPRATROPIUM-ALBUTEROL 0.5-2.5 (3) MG/3ML IN SOLN
3.0000 mL | Freq: Once | RESPIRATORY_TRACT | Status: AC
  Administered 2024-06-04: 3 mL via RESPIRATORY_TRACT
  Filled 2024-06-04: qty 3

## 2024-06-04 NOTE — ED Triage Notes (Signed)
 Pt brought in by family member and dropped off- states he is having chest pain and SHOB. Pt took breathing treatment at home with no relief.

## 2024-06-04 NOTE — ED Provider Notes (Signed)
 [place] EMERGENCY DEPARTMENT AT [facility] Provider Note   CSN: [id number] Arrival date & time: 06/04/24  4098     Patient presents with: Chest Pain and Shortness of Breath   Todd Estrada is a 64 y.o. male.    Chest Pain Associated symptoms: shortness of breath   Shortness of Breath Associated symptoms: chest pain      Patient has a history of emphysema pulmonary embolism chronic smoking, atrial fibrillation, prior stroke.  Patient has significant aphasia at baseline.  He is mostly only able to answer yes or no.  Patient presents ED with complaints of right sided chest pain.  Patient states he also is feeling short of breath.  He also has noticed some swelling in his lower legs.  No known fevers.  Prior to Admission medications   Medication Sig Start Date End Date Taking? Authorizing Provider  albuterol  (PROVENTIL ) (2.5 MG/3ML) 0.083% nebulizer solution Take 3 mLs (2.5 mg total) by nebulization every 4 (four) hours as needed for wheezing or shortness of breath. 08/16/22   Vita Grip, MD  apixaban  (ELIQUIS ) 5 MG TABS tablet Take 1 tablet (5 mg total) by mouth 2 (two) times daily. 04/07/24   Dorthy Gavia, MD  atorvastatin  (LIPITOR) 40 MG tablet Take 1 tablet (40 mg total) by mouth daily. 02/06/24 02/05/25  Justus, Michael, MD  chlorpheniramine-HYDROcodone (TUSSIONEX) 10-8 MG/5ML Take 5 mLs by mouth every 12 (twelve) hours as needed for cough. Patient not taking: Reported on 04/26/2024 04/23/24   Jonelle Neri, DO  clindamycin  (CLEOCIN ) 300 MG capsule Take 1 capsule (300 mg total) by mouth 3 (three) times daily. 05/15/24   Dalene Duck, MD  feeding supplement (ENSURE ENLIVE / ENSURE PLUS) LIQD Take 237 mLs by mouth 2 (two) times daily between meals. 05/03/24   Hoyt Macleod, MD  levETIRAcetam  (KEPPRA ) 500 MG tablet Take 1 tablet (500 mg total) by mouth 2 (two) times daily. 04/18/24   Johny Nap, NP  Multiple Vitamin (MULTIVITAMIN WITH MINERALS) TABS tablet Take 1  tablet by mouth daily.    [provider]  naproxen (NAPROSYN) 500 MG tablet Take 1 tablet (500 mg total) by mouth 2 (two) times daily. 06/01/24   Deatra Face, MD  pantoprazole  (PROTONIX ) 40 MG tablet TAKE 1 TABLET(40 MG) BY MOUTH DAILY Patient taking differently: Take 40 mg by mouth in the morning. 03/04/24   Dorthy Gavia, MD  Tiotropium Bromide-Olodaterol (STIOLTO RESPIMAT ) 2.5-2.5 MCG/ACT AERS NEW PRESCRIPTION REQUEST: STIOLTO 2.5 MCG- INHALE TWO PUFFS BY MOUTH DAILY Patient taking differently: Take 2 puffs by mouth daily at 12 noon. 02/07/24   Dorthy Gavia, MD    Allergies: Patient has no known allergies.    Review of Systems  Respiratory:  Positive for shortness of breath.   Cardiovascular:  Positive for chest pain.    Updated Vital Signs BP (!) 143/88 (BP Location: Left Arm)   Pulse 68   Temp 98.6 F (37 C) (Oral)   Resp 16   Ht 1.829 m (6')   Wt 93 kg   SpO2 99%   BMI 27.80 kg/m   Physical Exam Vitals and nursing note reviewed.  Constitutional:      General: He is not in acute distress.    Appearance: He is well-developed.  HENT:     Head: Normocephalic and atraumatic.     Right Ear: External ear normal.     Left Ear: External ear normal.   Eyes:     General: No scleral icterus.  Right eye: No discharge.        Left eye: No discharge.     Conjunctiva/sclera: Conjunctivae normal.   Neck:     Trachea: No tracheal deviation.   Cardiovascular:     Rate and Rhythm: Normal rate and regular rhythm.  Pulmonary:     Effort: Pulmonary effort is normal. No respiratory distress.     Breath sounds: Normal breath sounds. No stridor. No wheezing or rales.  Abdominal:     General: Bowel sounds are normal. There is no distension.     Palpations: Abdomen is soft.     Tenderness: There is no abdominal tenderness. There is no guarding or rebound.   Musculoskeletal:        General: No deformity.     Cervical back: Neck supple.     Right lower leg: No  tenderness. Edema present.     Left lower leg: No tenderness. Edema present.   Skin:    General: Skin is warm and dry.     Findings: No rash.   Neurological:     General: No focal deficit present.     Mental Status: He is alert.     Cranial Nerves: No cranial nerve deficit, dysarthria or facial asymmetry.     Sensory: No sensory deficit.     Motor: No abnormal muscle tone or seizure activity.     Coordination: Coordination normal.   Psychiatric:        Mood and Affect: Mood normal.     (all labs ordered are listed, but only abnormal results are displayed) Labs Reviewed  BASIC METABOLIC PANEL WITH GFR - Abnormal; Notable for the following components:      Result Value   Glucose, Bld 104 (*)    BUN 24 (*)    All other components within normal limits  CBC - Abnormal; Notable for the following components:   RBC 4.03 (*)    Hemoglobin 11.8 (*)    HCT 38.5 (*)    All other components within normal limits  BRAIN NATRIURETIC PEPTIDE  TROPONIN I (HIGH SENSITIVITY)  TROPONIN I (HIGH SENSITIVITY)    EKG: EKG Interpretation Date/Time:  Tuesday June 04 2024 19:00:56 EDT Ventricular Rate:  82 PR Interval:  164 QRS Duration:  88 QT Interval:  345 QTC Calculation: 403 R Axis:   64  Text Interpretation: Sinus rhythm No significant change since last tracing Confirmed by Trish Furl [id number]) on 06/04/2024 7:15:15 PM  Radiology: [facility] Query Chest 2 View Result Date: 06/04/2024 CLINICAL DATA:  chest pain, short of breath for 5 days. EXAM: CHEST - 2 VIEW COMPARISON:  Cxr 06/01/24, CT chest 03/01/2024 FINDINGS: The heart and mediastinal contours are unchanged. Similar-appearing subpleural right upper lobe nodule like density with overlying biopsy marker. Similar-appearing left upper lobe subpleural nodule like perihilar airspace opacity with associated scarring. Chronic coarsened interstitial markings with no overt pulmonary edema. No pleural effusion. No pneumothorax. No acute osseous abnormality.  IMPRESSION: 1. No radiographic evidence of acute cardiopulmonary abnormality. 2. Similar-appearing subpleural right upper lobe nodule like density with overlying biopsy marker. Similar-appearing left upper lobe subpleural nodule like perihilar airspace opacity with associated scarring. Electronically Signed   By: Morgane  Naveau M.D.   On: 06/04/2024 19:36     Procedures   Medications Ordered in the ED  ipratropium-albuterol  (DUONEB) 0.5-2.5 (3) MG/3ML nebulizer solution 3 mL (3 mLs Nebulization Given 06/04/24 1945)    Clinical Course as of 06/04/24 2258  Tue Jun 04, 2024  2021 CBC(!) Nl  leukocytosis, mild anemia  [JK]  2111 Brain natriuretic peptide BNP normal.  Troponin normal.  CBC metabolic panel normal. [JK]  2251 Troponin I (High Sensitivity) Troponin normal [JK]    Clinical Course User Index [JK] Trish Furl, MD                                 Medical Decision Making Problems Addressed: Chest pain, unspecified type: acute illness or injury that poses a threat to life or bodily functions  Amount and/or Complexity of Data Reviewed Labs: ordered. Decision-making details documented in ED Course. Radiology: ordered and independent interpretation performed.  Risk Prescription drug management.   Patient presented to the ED for evaluation of chest pain.  Patient has history of previous visits for similar symptoms.  ED workup reassuring.  Patient not having any respiratory difficulty.  No tachypnea.  No tachycardia.  No evidence of pneumonia on x-ray.  No signs of CHF.  Cardiac enzymes are normal.  Overall low suspicion for ACS at this time.  Evaluation and diagnostic testing in the emergency department does not suggest an emergent condition requiring admission or immediate intervention beyond what has been performed at this time.  The patient is safe for discharge and has been instructed to return immediately for worsening symptoms, change in symptoms or any other concerns.      Final diagnoses:  Chest pain, unspecified type    ED Discharge Orders     None          Trish Furl, MD 06/04/24 2258

## 2024-06-04 NOTE — Discharge Instructions (Signed)
 The tests did not show any signs of heart attack, pneumonia or other serious causes of chest pain.  Follow-up with your doctor next week to be rechecked.  Return to the ED as needed for any worsening symptoms

## 2024-06-07 ENCOUNTER — Other Ambulatory Visit: Payer: Self-pay

## 2024-06-07 ENCOUNTER — Encounter (HOSPITAL_COMMUNITY): Payer: Self-pay

## 2024-06-07 ENCOUNTER — Emergency Department (HOSPITAL_COMMUNITY)
Admission: EM | Admit: 2024-06-07 | Discharge: 2024-06-08 | Disposition: A | Discharge status: Home / Self Care | Attending: Emergency Medicine | Admitting: Emergency Medicine

## 2024-06-07 DIAGNOSIS — R1032 Left lower quadrant pain: Secondary | ICD-10-CM | POA: Insufficient documentation

## 2024-06-07 DIAGNOSIS — Z7901 Long term (current) use of anticoagulants: Secondary | ICD-10-CM | POA: Insufficient documentation

## 2024-06-07 DIAGNOSIS — J449 Chronic obstructive pulmonary disease, unspecified: Secondary | ICD-10-CM | POA: Insufficient documentation

## 2024-06-07 DIAGNOSIS — R109 Unspecified abdominal pain: Secondary | ICD-10-CM

## 2024-06-07 LAB — CBC
HCT: 40.9 % (ref 39.0–52.0)
Hemoglobin: 12.6 g/dL — ABNORMAL LOW (ref 13.0–17.0)
MCH: 28.9 pg (ref 26.0–34.0)
MCHC: 30.8 g/dL (ref 30.0–36.0)
MCV: 93.8 fL (ref 80.0–100.0)
Platelets: 179 10*3/uL (ref 150–400)
RBC: 4.36 MIL/uL (ref 4.22–5.81)
RDW: 14.2 % (ref 11.5–15.5)
WBC: 5.3 10*3/uL (ref 4.0–10.5)
nRBC: 0 % (ref 0.0–0.2)

## 2024-06-07 LAB — COMPREHENSIVE METABOLIC PANEL WITH GFR
ALT: 14 U/L (ref 0–44)
AST: 21 U/L (ref 15–41)
Albumin: 4.1 g/dL (ref 3.5–5.0)
Alkaline Phosphatase: 86 U/L (ref 38–126)
Anion gap: 10 (ref 5–15)
BUN: 17 mg/dL (ref 8–23)
CO2: 26 mmol/L (ref 22–32)
Calcium: 9.4 mg/dL (ref 8.9–10.3)
Chloride: 105 mmol/L (ref 98–111)
Creatinine, Ser: 1 mg/dL (ref 0.61–1.24)
GFR, Estimated: >60 mL/min (ref >60)
Glucose, Bld: 85 mg/dL (ref 70–99)
Potassium: 3.9 mmol/L (ref 3.5–5.1)
Sodium: 141 mmol/L (ref 135–145)
Total Bilirubin: 0.6 mg/dL (ref 0.0–1.2)
Total Protein: 7.5 g/dL (ref 6.5–8.1)

## 2024-06-07 LAB — LIPASE, BLOOD: Lipase: 33 U/L (ref 11–51)

## 2024-06-07 NOTE — ED Triage Notes (Signed)
 Pt is coming in for abd pain, he was dropped off by a family member who has left after triage, pt is non-verbal due to previous stroke, he points at the left lower and mid abd for the cause of pain. Pt is otherwise stable.

## 2024-06-08 ENCOUNTER — Emergency Department (HOSPITAL_COMMUNITY)

## 2024-06-08 LAB — URINALYSIS, ROUTINE W REFLEX MICROSCOPIC
Bilirubin Urine: NEGATIVE
Glucose, UA: NEGATIVE mg/dL
Hgb urine dipstick: NEGATIVE
Ketones, ur: NEGATIVE mg/dL
Leukocytes,Ua: NEGATIVE
Nitrite: NEGATIVE
Protein, ur: NEGATIVE mg/dL
Specific Gravity, Urine: 1.014 (ref 1.005–1.030)
pH: 5 (ref 5.0–8.0)

## 2024-06-08 MED ORDER — DICYCLOMINE HCL 10 MG PO CAPS
10.0000 mg | ORAL_CAPSULE | Freq: Once | ORAL | Status: AC
  Administered 2024-06-08: 10 mg via ORAL
  Filled 2024-06-08: qty 1

## 2024-06-08 MED ORDER — DICYCLOMINE HCL 20 MG PO TABS
20.0000 mg | ORAL_TABLET | Freq: Two times a day (BID) | ORAL | 0 refills | Status: DC
Start: 2024-06-08 — End: 2024-07-02

## 2024-06-08 MED ORDER — IOHEXOL 300 MG/ML  SOLN
100.0000 mL | Freq: Once | INTRAMUSCULAR | Status: AC | PRN
  Administered 2024-06-08: 100 mL via INTRAVENOUS

## 2024-06-08 MED ORDER — FENTANYL CITRATE PF 50 MCG/ML IJ SOSY
50.0000 ug | PREFILLED_SYRINGE | Freq: Once | INTRAMUSCULAR | Status: AC
  Administered 2024-06-08: 50 ug via INTRAVENOUS
  Filled 2024-06-08: qty 1

## 2024-06-08 NOTE — Discharge Instructions (Addendum)
 Your workup this morning was reassuring.  The cause of your abdominal pain is unclear.  I have prescribed a medication which can help with abdominal cramping.  Please take as directed.  If you develop any life-threatening symptoms return to the emergency department.

## 2024-06-08 NOTE — ED Notes (Signed)
 Called family to let them know about patient's discharge. Stated they will be on their way.

## 2024-06-08 NOTE — ED Notes (Signed)
 Family updated as to patient's status. [name] - Cousin

## 2024-06-08 NOTE — ED Notes (Addendum)
 Patient given ham sandwich and soda. Patient communicated that family will pick him up at 0700

## 2024-06-08 NOTE — ED Provider Notes (Signed)
 [place] EMERGENCY DEPARTMENT AT [facility] Provider Note   CSN: [id number] Arrival date & time: 06/07/24  2024     Patient presents with: Abdominal Pain   Todd Estrada is a 63 y.o. male.  Patient with past medical history significant for CVA with expressive aphasia, A-fib on Eliquis  presents the emergency room complaining of left lower quadrant abdominal pain.  He denies nausea, vomiting, diarrhea.  He is able to answer questions by nodding his head.  He has frequent presentations for abdominal pain.  He denies chest pain, shortness of breath, fever, urinary symptoms.  Patient is difficult to obtain history from due to his aphasia.    Abdominal Pain      Prior to Admission medications   Medication Sig Start Date End Date Taking? Authorizing Provider  dicyclomine  (BENTYL ) 20 MG tablet Take 1 tablet (20 mg total) by mouth 2 (two) times daily. 06/08/24  Yes Logan Ubaldo NOVAK, PA-C  albuterol  (PROVENTIL ) (2.5 MG/3ML) 0.083% nebulizer solution Take 3 mLs (2.5 mg total) by nebulization every 4 (four) hours as needed for wheezing or shortness of breath. 08/16/22   Lou Claretta HERO, MD  apixaban  (ELIQUIS ) 5 MG TABS tablet Take 1 tablet (5 mg total) by mouth 2 (two) times daily. 04/07/24   Gregary Sharper, MD  atorvastatin  (LIPITOR) 40 MG tablet Take 1 tablet (40 mg total) by mouth daily. 02/06/24 02/05/25  Justus, Michael, MD  chlorpheniramine-HYDROcodone (TUSSIONEX) 10-8 MG/5ML Take 5 mLs by mouth every 12 (twelve) hours as needed for cough. Patient not taking: Reported on 04/26/2024 04/23/24   Tobie Gaines, DO  clindamycin  (CLEOCIN ) 300 MG capsule Take 1 capsule (300 mg total) by mouth 3 (three) times daily. 05/15/24   [name], MD  feeding supplement (ENSURE ENLIVE / ENSURE PLUS) LIQD Take 237 mLs by mouth 2 (two) times daily between meals. 05/03/24   Arlice Reichert, MD  levETIRAcetam  (KEPPRA ) 500 MG tablet Take 1 tablet (500 mg total) by mouth 2 (two) times daily. 04/18/24    Whitfield Raisin, NP  Multiple Vitamin (MULTIVITAMIN WITH MINERALS) TABS tablet Take 1 tablet by mouth daily.    [provider]  naproxen  (NAPROSYN ) 500 MG tablet Take 1 tablet (500 mg total) by mouth 2 (two) times daily. 06/01/24   Nanavati, Ankit, MD  pantoprazole  (PROTONIX ) 40 MG tablet TAKE 1 TABLET(40 MG) BY MOUTH DAILY Patient taking differently: Take 40 mg by mouth in the morning. 03/04/24   Gregary Sharper, MD  Tiotropium Bromide-Olodaterol (STIOLTO RESPIMAT ) 2.5-2.5 MCG/ACT AERS NEW PRESCRIPTION REQUEST: STIOLTO 2.5 MCG- INHALE TWO PUFFS BY MOUTH DAILY Patient taking differently: Take 2 puffs by mouth daily at 12 noon. 02/07/24   Gregary Sharper, MD    Allergies: Patient has no known allergies.    Review of Systems  Gastrointestinal:  Positive for abdominal pain.    Updated Vital Signs BP 136/85 (BP Location: Left Arm)   Pulse 79   Temp 98.7 F (37.1 C) (Oral)   Resp 16   SpO2 98%   Physical Exam Vitals and nursing note reviewed.  Constitutional:      General: He is not in acute distress.    Appearance: He is well-developed.  HENT:     Head: Normocephalic and atraumatic.   Eyes:     Conjunctiva/sclera: Conjunctivae normal.    Cardiovascular:     Rate and Rhythm: Normal rate and regular rhythm.  Pulmonary:     Effort: Pulmonary effort is normal.     Breath sounds:  Normal breath sounds.  Abdominal:     Palpations: Abdomen is soft.     Tenderness: There is abdominal tenderness in the left lower quadrant.   Musculoskeletal:        General: No swelling.     Cervical back: Neck supple.   Skin:    General: Skin is warm and dry.     Capillary Refill: Capillary refill takes less than 2 seconds.   Neurological:     Mental Status: He is alert.   Psychiatric:        Mood and Affect: Mood normal.     (all labs ordered are listed, but only abnormal results are displayed) Labs Reviewed  CBC - Abnormal; Notable for the following components:      Result  Value   Hemoglobin 12.6 (*)    All other components within normal limits  LIPASE, BLOOD  COMPREHENSIVE METABOLIC PANEL WITH GFR  URINALYSIS, ROUTINE W REFLEX MICROSCOPIC    EKG: None  Radiology: CT ABDOMEN PELVIS W CONTRAST Result Date: 06/08/2024 CLINICAL DATA:  Left lower quadrant abdominal pain EXAM: CT ABDOMEN AND PELVIS WITH CONTRAST TECHNIQUE: Multidetector CT imaging of the abdomen and pelvis was performed using the standard protocol following bolus administration of intravenous contrast. RADIATION DOSE REDUCTION: This exam was performed according to the departmental dose-optimization program which includes automated exposure control, adjustment of the mA and/or kV according to patient size and/or use of iterative reconstruction technique. CONTRAST:  100mL OMNIPAQUE  IOHEXOL  300 MG/ML  SOLN COMPARISON:  None Available. FINDINGS: Lower chest: The visualized lung bases appear hyperinflated in keeping with COPD. Scattered bibasilar ground-glass pulmonary infiltrate appears improved since prior examination in keeping with a resolving infectious or inflammatory process. Cardiac size within normal limits. Hepatobiliary: Mild hepatic steatosis. No focal liver abnormality is seen. Status post cholecystectomy. No biliary dilatation. Pancreas: Unremarkable Spleen: Unremarkable Adrenals/Urinary Tract: Adrenal glands are unremarkable. Kidneys are normal, without renal calculi, focal lesion, or hydronephrosis. Bladder is unremarkable. Stomach/Bowel: Stomach is within normal limits. Appendix appears normal. No evidence of bowel wall thickening, distention, or inflammatory changes. Vascular/Lymphatic: Extensive aortoiliac atherosclerotic calcification. 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 No pathologic adenopathy within the abdomen and  pelvis. Reproductive: Prostate is unremarkable. Other: No abdominal wall hernia or abnormality. No abdominopelvic ascites. Musculoskeletal: No acute bone abnormality. No lytic or blastic bone lesion. Osseous structures are age appropriate. IMPRESSION: 1. No acute intra-abdominal pathology identified. No evidence of acute diverticulitis. 2. 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 3. Mild hepatic steatosis. 4. COPD. 5. Improving bibasilar ground-glass pulmonary infiltrate in keeping with a resolving infectious or inflammatory process. 6. Peripheral vascular disease Aortic Atherosclerosis (ICD10-I70.0). Electronically Signed   By: Dorethia Molt M.D.   On: 06/08/2024 02:35     Procedures   Medications Ordered in the ED  fentaNYL  (SUBLIMAZE ) injection 50 mcg (50 mcg Intravenous Given 06/08/24 0103)  iohexol  (OMNIPAQUE ) 300 MG/ML solution 100 mL (100 mLs Intravenous Contrast Given 06/08/24 0157)  dicyclomine  (BENTYL ) capsule 10 mg (10 mg Oral Given 06/08/24 0322)                                    Medical Decision Making Amount and/or Complexity of Data Reviewed Labs: ordered.  Radiology: ordered.  Risk Prescription drug management.   This patient presents to the ED for concern of abdominal pain, this involves an extensive number of treatment options, and is a complaint that carries with it a high risk of complications and morbidity.  The differential diagnosis includes diverticulitis, colitis, appendicitis, other intra-abdominal etiologies   Co morbidities / Chronic conditions that complicate the patient evaluation  Previous CVA   Additional history obtained:  Additional history obtained from EMR External records from outside source obtained and reviewed including recent discharge summary after patient admitted for weakness   Lab Tests:  I Ordered, and personally interpreted  labs.  The pertinent results include: Unremarkable CMP, unremarkable UA, hemoglobin 12.6 grossly at baseline   Imaging Studies ordered:  I ordered imaging studies including CT abdomen pelvis with contrast I independently visualized and interpreted imaging which showed  1. No acute intra-abdominal pathology identified. No evidence of  acute diverticulitis.  2. Extensive aortoiliac atherosclerotic calcification with chronic  occlusion of the left common iliac artery and proximal inferior  mesenteric artery.  3. Mild hepatic steatosis.  4. COPD.  5. Improving bibasilar ground-glass pulmonary infiltrate in keeping  with a resolving infectious or inflammatory process.  6. Peripheral vascular disease    Aortic Atherosclerosis   I agree with the radiologist interpretation   Problem List / ED Course / Critical interventions / Medication management   I ordered medication including fentanyl , Bentyl  Reevaluation of the patient after these medicines showed that the patient improved I have reviewed the patients home medicines and have made adjustments as needed    Social Determinants of Health:  Patient has Medicaid for his primary health insurance type   Test / Admission - Considered:  CT scan showing no findings to explain patient's symptoms.  Labs are grossly unremarkable.  Patient continues to have some mild left lower quadrant abdominal pain of unclear etiology.  He has previously had the same presentation which also had a reassuring workup.  He is eating and drinking without difficulty.  Plan to discharge home with prescription for Bentyl .  I have also recommended that the patient establish care with a primary care provider for further management of chronic conditions.  No indication at this time for admission or further emergent workup.      Final diagnoses:  Abdominal pain, unspecified abdominal location    ED Discharge Orders          Ordered    dicyclomine  (BENTYL ) 20  MG tablet  2 times daily        06/08/24 0305               Logan Ubaldo NOVAK, PA-C 06/08/24 0354    Trine Raynell Moder, MD 06/09/24 [phone]

## 2024-06-10 ENCOUNTER — Encounter: Payer: Self-pay | Admitting: Physician Assistant

## 2024-06-10 ENCOUNTER — Ambulatory Visit: Attending: Physician Assistant | Admitting: Physician Assistant

## 2024-06-10 VITALS — BP 138/75 | Ht 73.0 in | Wt 193.0 lb

## 2024-06-10 DIAGNOSIS — I959 Hypotension, unspecified: Secondary | ICD-10-CM | POA: Diagnosis present

## 2024-06-10 DIAGNOSIS — E7849 Other hyperlipidemia: Secondary | ICD-10-CM | POA: Diagnosis present

## 2024-06-10 DIAGNOSIS — I63512 Cerebral infarction due to unspecified occlusion or stenosis of left middle cerebral artery: Secondary | ICD-10-CM | POA: Diagnosis present

## 2024-06-10 DIAGNOSIS — I480 Paroxysmal atrial fibrillation: Secondary | ICD-10-CM | POA: Insufficient documentation

## 2024-06-10 DIAGNOSIS — I2510 Atherosclerotic heart disease of native coronary artery without angina pectoris: Secondary | ICD-10-CM | POA: Insufficient documentation

## 2024-06-10 DIAGNOSIS — J449 Chronic obstructive pulmonary disease, unspecified: Secondary | ICD-10-CM | POA: Insufficient documentation

## 2024-06-10 NOTE — Patient Instructions (Addendum)
 Low-Sodium Eating Plan Salt (sodium) helps you keep a healthy balance of fluids in your body. Too much sodium can raise your blood pressure. It can also cause fluid and waste to be held in your body. Your health care provider or dietitian may recommend a low-sodium eating plan if you have high blood pressure (hypertension), kidney disease, liver disease, or heart failure. Eating less sodium can help lower your blood pressure and reduce swelling. It can also protect your heart, liver, and kidneys. What are tips for following this plan? Reading food labels  Check food labels for the amount of sodium per serving. If you eat more than one serving, you must multiply the listed amount by the number of servings. Choose foods with less than 140 milligrams (mg) of sodium per serving. Avoid foods with 300 mg of sodium or more per serving. Always check how much sodium is in a product, even if the label says unsalted or no salt added. Shopping  Buy products labeled as low-sodium or no salt added. Buy fresh foods. Avoid canned foods and pre-made or frozen meals. Avoid canned, cured, or processed meats. Buy breads that have less than 80 mg of sodium per slice. Cooking  Eat more home-cooked food. Try to eat less restaurant, buffet, and fast food. Try not to add salt when you cook. Use salt-free seasonings or herbs instead of table salt or sea salt. Check with your provider or pharmacist before using salt substitutes. Cook with plant-based oils, such as canola, sunflower, or olive oil. Meal planning When eating at a restaurant, ask if your food can be made with less salt or no salt. Avoid dishes labeled as brined, pickled, cured, or smoked. Avoid dishes made with soy sauce, miso, or teriyaki sauce. Avoid foods that have monosodium glutamate (MSG) in them. MSG may be added to some restaurant food, sauces, soups, bouillon, and canned foods. Make meals that can be grilled, baked, poached, roasted, or  steamed. These are often made with less sodium. General information Try to limit your sodium intake to 1,500-2,300 mg each day, or the amount told by your provider. What foods should I eat? Fruits Fresh, frozen, or canned fruit. Fruit juice. Vegetables Fresh or frozen vegetables. No salt added canned vegetables. No salt added tomato sauce and paste. Low-sodium or reduced-sodium tomato and vegetable juice. Grains Low-sodium cereals, such as oats, puffed wheat and rice, and shredded wheat. Low-sodium crackers. Unsalted rice. Unsalted pasta. Low-sodium bread. Whole grain breads and whole grain pasta. Meats and other proteins Fresh or frozen meat, poultry, seafood, and fish. These should have no added salt. Low-sodium canned tuna and salmon. Unsalted nuts. Dried peas, beans, and lentils without added salt. Unsalted canned beans. Eggs. Unsalted nut butters. Dairy Milk. Soy milk. Cheese that is naturally low in sodium, such as ricotta cheese, fresh mozzarella, or Swiss cheese. Low-sodium or reduced-sodium cheese. Cream cheese. Yogurt. Seasonings and condiments Fresh and dried herbs and spices. Salt-free seasonings. Low-sodium mustard and ketchup. Sodium-free salad dressing. Sodium-free light mayonnaise. Fresh or refrigerated horseradish. Lemon juice. Vinegar. Other foods Homemade, reduced-sodium, or low-sodium soups. Unsalted popcorn and pretzels. Low-salt or salt-free chips. The items listed above may not be all the foods and drinks you can have. Talk to a dietitian to learn more. What foods should I avoid? Vegetables Sauerkraut, pickled vegetables, and relishes. Olives. [place] fries. Onion rings. Regular canned vegetables, except low-sodium or reduced-sodium items. Regular canned tomato sauce and paste. Regular tomato and vegetable juice. Frozen vegetables in sauces. Grains Instant  hot cereals. Bread stuffing, pancake, and biscuit mixes. Croutons. Seasoned rice or pasta mixes. Noodle soup  cups. Boxed or frozen macaroni and cheese. Regular salted crackers. Self-rising flour. Meats and other proteins Meat or fish that is salted, canned, smoked, spiced, or pickled. Precooked or cured meat, such as sausages or meat loaves. [place]. Ham. Pepperoni. Hot dogs. Corned beef. Chipped beef. Salt pork. Jerky. Pickled herring, anchovies, and sardines. Regular canned tuna. Salted nuts. Dairy Processed cheese and cheese spreads. Hard cheeses. Cheese curds. Blue cheese. Feta cheese. String cheese. Regular cottage cheese. Buttermilk. Canned milk. Fats and oils Salted butter. Regular margarine. Ghee. Bacon fat. Seasonings and condiments Onion salt, garlic salt, seasoned salt, table salt, and sea salt. Canned and packaged gravies. Worcestershire sauce. Tartar sauce. Barbecue sauce. Teriyaki sauce. Soy sauce, including reduced-sodium soy sauce. Steak sauce. Fish sauce. Oyster sauce. Cocktail sauce. Horseradish that you find on the shelf. Regular ketchup and mustard. Meat flavorings and tenderizers. Bouillon cubes. Hot sauce. Pre-made or packaged marinades. Pre-made or packaged taco seasonings. Relishes. Regular salad dressings. Salsa. Other foods Salted popcorn and pretzels. Corn chips and puffs. Potato and tortilla chips. Canned or dried soups. Pizza. Frozen entrees and pot pies. The items listed above may not be all the foods and drinks you should avoid. Talk to a dietitian to learn more. This information is not intended to replace advice given to you by your health care provider. Make sure you discuss any questions you have with your health care provider. Document Revised: 12/22/2022 Document Reviewed: 12/22/2022 Elsevier Patient Education  2024 [employer]Medication Instructions:   Your physician recommends that you continue on your current medications as directed. Please refer to the Current Medication list given to you today.   *If you need a refill on your cardiac medications before your next  appointment, please call your pharmacy*    Lab Work: NONE ORDERED  TODAY    If you have labs (blood work) drawn today and your tests are completely normal, you will receive your results only by: MyChart Message (if you have MyChart) OR A paper copy in the mail If you have any lab test that is abnormal or we need to change your treatment, we will call you to review the results.  Testing/Procedures:  NONE ORDERED  TODAY    Follow-Up: At [facility], you and your health needs are our priority.  As part of our continuing mission to provide you with exceptional heart care, our providers are all part of one team.  This team includes your primary Cardiologist (physician) and Advanced Practice Providers or APPs (Physician Assistants and Nurse Practitioners) who all work together to provide you with the care you need, when you need it.  Your next appointment:    6 month(s)  Provider:   Stanly DELENA Leavens, MD    We recommend signing up for the patient portal called MyChart.  Sign up information is provided on this After Visit Summary.  MyChart is used to connect with patients for Virtual Visits (Telemedicine).  Patients are able to view lab/test results, encounter notes, upcoming appointments, etc.  Non-urgent messages can be sent to your provider as well.   To learn more about what you can do with MyChart, go to [email or web address].   Other Instructions

## 2024-06-13 ENCOUNTER — Emergency Department (HOSPITAL_COMMUNITY): Admission: EM | Admit: 2024-06-13 | Discharge: 2024-06-13 | Disposition: A | Discharge status: Home / Self Care

## 2024-06-13 ENCOUNTER — Emergency Department (HOSPITAL_COMMUNITY)

## 2024-06-13 ENCOUNTER — Encounter (HOSPITAL_COMMUNITY): Payer: Self-pay

## 2024-06-13 ENCOUNTER — Other Ambulatory Visit: Payer: Self-pay

## 2024-06-13 DIAGNOSIS — M79661 Pain in right lower leg: Secondary | ICD-10-CM | POA: Insufficient documentation

## 2024-06-13 DIAGNOSIS — M79662 Pain in left lower leg: Secondary | ICD-10-CM | POA: Insufficient documentation

## 2024-06-13 DIAGNOSIS — M79631 Pain in right forearm: Secondary | ICD-10-CM | POA: Insufficient documentation

## 2024-06-13 DIAGNOSIS — Z7901 Long term (current) use of anticoagulants: Secondary | ICD-10-CM | POA: Diagnosis not present

## 2024-06-13 DIAGNOSIS — W19XXXA Unspecified fall, initial encounter: Secondary | ICD-10-CM | POA: Insufficient documentation

## 2024-06-13 DIAGNOSIS — M542 Cervicalgia: Secondary | ICD-10-CM

## 2024-06-13 DIAGNOSIS — M7918 Myalgia, other site: Secondary | ICD-10-CM

## 2024-06-13 MED ORDER — IBUPROFEN 800 MG PO TABS
800.0000 mg | ORAL_TABLET | Freq: Once | ORAL | Status: AC
  Administered 2024-06-13: 800 mg via ORAL
  Filled 2024-06-13: qty 1

## 2024-06-13 NOTE — ED Provider Notes (Signed)
 [place] EMERGENCY DEPARTMENT AT [facility] Provider Note   CSN: [id number] Arrival date & time: 06/13/24  1227     Patient presents with: Leg Pain   Todd Estrada is a 63 y.o. male presents today for bilateral shin pain and right forearm pain after a fall 2 days ago.  Patient denies head injury, or any other complaints at this time.  Patient is nonverbal at baseline due to his stroke many years ago.  Patient is anticoagulated on Eliquis .    Leg Pain      Prior to Admission medications   Medication Sig Start Date End Date Taking? Authorizing Provider  albuterol  (PROVENTIL ) (2.5 MG/3ML) 0.083% nebulizer solution Take 3 mLs (2.5 mg total) by nebulization every 4 (four) hours as needed for wheezing or shortness of breath. 08/16/22   Lou Claretta HERO, MD  apixaban  (ELIQUIS ) 5 MG TABS tablet Take 1 tablet (5 mg total) by mouth 2 (two) times daily. 04/07/24   Gregary Sharper, MD  atorvastatin  (LIPITOR) 40 MG tablet Take 1 tablet (40 mg total) by mouth daily. 02/06/24 02/05/25  Justus, Michael, MD  chlorpheniramine-HYDROcodone (TUSSIONEX) 10-8 MG/5ML Take 5 mLs by mouth every 12 (twelve) hours as needed for cough. 04/23/24   Tobie Gaines, DO  clindamycin  (CLEOCIN ) 300 MG capsule Take 1 capsule (300 mg total) by mouth 3 (three) times daily. 05/15/24   [name], MD  dicyclomine  (BENTYL ) 20 MG tablet Take 1 tablet (20 mg total) by mouth 2 (two) times daily. 06/08/24   Logan Ubaldo NOVAK, PA-C  feeding supplement (ENSURE ENLIVE / ENSURE PLUS) LIQD Take 237 mLs by mouth 2 (two) times daily between meals. 05/03/24   Arlice Reichert, MD  levETIRAcetam  (KEPPRA ) 500 MG tablet Take 1 tablet (500 mg total) by mouth 2 (two) times daily. 04/18/24   Whitfield Raisin, NP  Multiple Vitamin (MULTIVITAMIN WITH MINERALS) TABS tablet Take 1 tablet by mouth daily.    [provider]  naproxen  (NAPROSYN ) 500 MG tablet Take 1 tablet (500 mg total) by mouth 2 (two) times daily. 06/01/24   Charlyn Sora, MD  pantoprazole  (PROTONIX ) 40 MG tablet TAKE 1 TABLET(40 MG) BY MOUTH DAILY Patient taking differently: Take 40 mg by mouth in the morning. 03/04/24   Gregary Sharper, MD  Tiotropium Bromide-Olodaterol (STIOLTO RESPIMAT ) 2.5-2.5 MCG/ACT AERS NEW PRESCRIPTION REQUEST: STIOLTO 2.5 MCG- INHALE TWO PUFFS BY MOUTH DAILY Patient taking differently: Take 2 puffs by mouth daily at 12 noon. 02/07/24   Gregary Sharper, MD    Allergies: Patient has no known allergies.    Review of Systems  Musculoskeletal:  Positive for arthralgias.    Updated Vital Signs BP 100/73 (BP Location: Left Arm)   Pulse 72   Temp 98.4 F (36.9 C) (Oral)   Resp 18   Ht 6' 1 (1.854 m)   Wt 86.2 kg   SpO2 94%   BMI 25.07 kg/m   Physical Exam Vitals and nursing note reviewed.  Constitutional:      General: He is not in acute distress.    Appearance: He is well-developed.     Comments: Disheveled appearance  HENT:     Head: Normocephalic and atraumatic.     Nose: Nose normal.     Mouth/Throat:     Mouth: Mucous membranes are moist.   Eyes:     Conjunctiva/sclera: Conjunctivae normal.    Cardiovascular:     Rate and Rhythm: Normal rate and regular rhythm.     Pulses: Normal  pulses.  Pulmonary:     Effort: Pulmonary effort is normal. No respiratory distress.     Breath sounds: Normal breath sounds.  Abdominal:     Palpations: Abdomen is soft.     Tenderness: There is no abdominal tenderness.   Musculoskeletal:        General: No swelling.     Cervical back: Neck supple.     Right lower leg: No edema.     Left lower leg: No edema.   Skin:    General: Skin is warm and dry.     Capillary Refill: Capillary refill takes less than 2 seconds.     Findings: No bruising.   Neurological:     Mental Status: He is alert. Mental status is at baseline.   Psychiatric:        Mood and Affect: Mood normal.     (all labs ordered are listed, but only abnormal results are displayed) Labs Reviewed -  No data to display  EKG: None  Radiology: DG Forearm Right Result Date: 06/13/2024 CLINICAL DATA:  Right arm pain EXAM: RIGHT FOREARM - 2 VIEW COMPARISON:  January 29, 2023 FINDINGS: Osteopenia.No acute fracture or dislocation. There is no evidence of arthropathy or other focal bone abnormality. Soft tissues are unremarkable. IMPRESSION: No acute fracture or dislocation. Electronically Signed   By: Rogelia Myers M.D.   On: 06/13/2024 15:42   DG Tibia/Fibula Right Result Date: 06/13/2024 CLINICAL DATA:  Bilateral lower leg pain EXAM: RIGHT TIBIA AND FIBULA - 2 VIEW COMPARISON:  November 08, 2023 FINDINGS: Osteopenia.No acute fracture or dislocation. There is no evidence of arthropathy or other focal bone abnormality. Peripheral vascular atherosclerosis. IMPRESSION: Osteopenia.  No acute fracture or dislocation. Electronically Signed   By: Rogelia Myers M.D.   On: 06/13/2024 14:39   DG Tibia/Fibula Left Result Date: 06/13/2024 CLINICAL DATA:  pain EXAM: LEFT TIBIA AND FIBULA - 2 VIEW COMPARISON:  None Available. FINDINGS: Osteopenia.No acute fracture or dislocation. There is no evidence of arthropathy or other focal bone abnormality. Soft tissues are unremarkable. IMPRESSION: No acute fracture or dislocation. Electronically Signed   By: Rogelia Myers M.D.   On: 06/13/2024 14:38   DG Wrist Complete Right Result Date: 06/13/2024 CLINICAL DATA:  Right arm pain EXAM: RIGHT WRIST - COMPLETE 3+ VIEW COMPARISON:  January 29, 2023 FINDINGS: Diffuse osteopenia.No acute fracture or dislocation. There is no evidence of arthropathy or other focal bone abnormality. Soft tissues are unremarkable. IMPRESSION: Diffuse osteopenia.  No acute fracture or dislocation. Electronically Signed   By: Rogelia Myers M.D.   On: 06/13/2024 14:36   CT Head Wo Contrast Result Date: 06/13/2024 CLINICAL DATA:  Provided history: Head trauma, coagulopathy. Neck trauma, midline tenderness. EXAM: CT HEAD WITHOUT CONTRAST CT  CERVICAL SPINE WITHOUT CONTRAST TECHNIQUE: Multidetector CT imaging of the head and cervical spine was performed following the standard protocol without intravenous contrast. Multiplanar CT image reconstructions of the cervical spine were also generated. RADIATION DOSE REDUCTION: This exam was performed according to the departmental dose-optimization program which includes automated exposure control, adjustment of the mA and/or kV according to patient size and/or use of iterative reconstruction technique. COMPARISON:  Head CT 11/19/2023.  Cervical spine CT 11/19/2023. FINDINGS: CT HEAD FINDINGS Brain: Generalized cerebral atrophy. Known extensive chronic cortical/subcortical left MCA territory infarcts (with associated dystrophic calcification). Mild ill-defined hypoattenuation elsewhere within the cerebral white matter, nonspecific but compatible with chronic small vessel disease Patchy chronic infarcts within the left cerebellar hemisphere, unchanged. There is  no acute intracranial hemorrhage. No demarcated cortical infarct. No extra-axial fluid collection. No evidence of an intracranial mass. No midline shift. Vascular: No hyperdense vessel atherosclerotic calcifications. Skull: No calvarial fracture or aggressive osseous lesion. Sinuses/Orbits: No mass or acute finding within the imaged orbits. Chronically opacified right maxillary sinus. The right maxillary sinus is asymmetrically small, which may be developmental or may reflect sinus atelectasis. Mild mucosal thickening within the left maxillary sinus. Moderate left ethmoid sinusitis. Mild-to-moderate mucosal thickening within the left frontal sinus. CT CERVICAL SPINE FINDINGS Alignment: No significant spondylolisthesis. Skull base and vertebrae: The basion-dental and atlanto-dental intervals are maintained.No evidence of acute fracture to the cervical spine. Soft tissues and spinal canal: No prevertebral fluid or swelling. No visible canal hematoma. Disc  levels: Cervical spondylosis with multilevel disc space narrowing, disc bulges/central disc protrusions, uncovertebral hypertrophy and facet arthropathy. Disc space narrowing is greatest at C6-C7 (moderate-to-advanced at this level). Multilevel spinal canal narrowing. Most notably at C6-C7, a posterior disc osteophyte complex contributes to at least moderate spinal canal stenosis. Multilevel bony neural foraminal narrowing. Mild multilevel ossification of the posterior longitudinal ligament. Multilevel ventral osteophytes, most prominent at C6-C7. Degenerative changes also present at the C1-C2 articulation. Upper chest: No consolidation within the imaged lung apices. No visible pneumothorax. Emphysema. IMPRESSION: CT head: 1.  No evidence of an acute intracranial abnormality. 2. Chronic left cerebral and left cerebellar infarcts, unchanged. 3. Background parenchymal atrophy and chronic small vessel ischemic disease. 4. Paranasal sinus disease as described. Most notably, there is chronic, severe right maxillary sinusitis (with possible sinus atelectasis). CT cervical spine: 1. No evidence of an acute cervical spine fracture. 2. 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 At C6-C7, a posterior disc osteophyte complex contributes to at least moderate spinal canal stenosis. 3. Emphysema (ICD10-J43.9). Electronically Signed   By: Rockey Childs D.O.   On: 06/13/2024 14:19   CT Cervical Spine Wo Contrast Result Date: 06/13/2024 CLINICAL DATA:  Provided history: Head trauma, coagulopathy. Neck trauma, midline tenderness. EXAM: CT HEAD WITHOUT CONTRAST CT CERVICAL SPINE WITHOUT CONTRAST TECHNIQUE: Multidetector CT imaging of the head and cervical spine was performed following the standard protocol without intravenous contrast. Multiplanar CT image reconstructions of the cervical spine were also generated. RADIATION DOSE REDUCTION: This exam was  performed according to the departmental dose-optimization program which includes automated exposure control, adjustment of the mA and/or kV according to patient size and/or use of iterative reconstruction technique. COMPARISON:  Head CT 11/19/2023.  Cervical spine CT 11/19/2023. FINDINGS: CT HEAD FINDINGS Brain: Generalized cerebral atrophy. Known extensive chronic cortical/subcortical left MCA territory infarcts (with associated dystrophic calcification). Mild ill-defined hypoattenuation elsewhere within the cerebral white matter, nonspecific but compatible with chronic small vessel disease Patchy chronic infarcts within the left cerebellar hemisphere, unchanged. There is no acute intracranial hemorrhage. No demarcated cortical infarct. No extra-axial fluid collection. No evidence of an intracranial mass. No midline shift. Vascular: No hyperdense vessel atherosclerotic calcifications. Skull: No calvarial fracture or aggressive osseous lesion. Sinuses/Orbits: No mass or acute finding within the imaged orbits. Chronically opacified right maxillary sinus. The right maxillary sinus is asymmetrically small, which may be developmental or may reflect sinus atelectasis. Mild mucosal thickening within the left maxillary sinus. Moderate left ethmoid sinusitis. Mild-to-moderate mucosal thickening within the left frontal sinus. CT CERVICAL SPINE FINDINGS Alignment: No significant spondylolisthesis. Skull base and vertebrae: The basion-dental and atlanto-dental intervals are maintained.No evidence of acute fracture to the cervical spine. Soft  tissues and spinal canal: No prevertebral fluid or swelling. No visible canal hematoma. Disc levels: Cervical spondylosis with multilevel disc space narrowing, disc bulges/central disc protrusions, uncovertebral hypertrophy and facet arthropathy. Disc space narrowing is greatest at C6-C7 (moderate-to-advanced at this level). Multilevel spinal canal narrowing. Most notably at C6-C7, a  posterior disc osteophyte complex contributes to at least moderate spinal canal stenosis. Multilevel bony neural foraminal narrowing. Mild multilevel ossification of the posterior longitudinal ligament. Multilevel ventral osteophytes, most prominent at C6-C7. Degenerative changes also present at the C1-C2 articulation. Upper chest: No consolidation within the imaged lung apices. No visible pneumothorax. Emphysema. IMPRESSION: CT head: 1.  No evidence of an acute intracranial abnormality. 2. Chronic left cerebral and left cerebellar infarcts, unchanged. 3. Background parenchymal atrophy and chronic small vessel ischemic disease. 4. Paranasal sinus disease as described. Most notably, there is chronic, severe right maxillary sinusitis (with possible sinus atelectasis). CT cervical spine: 1. No evidence of an acute cervical spine fracture. 2. 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 At C6-C7, a posterior disc osteophyte complex contributes to at least moderate spinal canal stenosis. 3. Emphysema (ICD10-J43.9). Electronically Signed   By: Rockey Childs D.O.   On: 06/13/2024 14:19     Procedures   Medications Ordered in the ED  ibuprofen (ADVIL) tablet 800 mg (800 mg Oral Given 06/13/24 1525)                                    Medical Decision Making Amount and/or Complexity of Data Reviewed Radiology: ordered.  Risk Prescription drug management.   This patient presents to the ED for concern of fall differential diagnosis includes brain bleed, fracture, musculoskeletal pain    Imaging Studies ordered:  I ordered imaging studies including bilateral tib-fib x-rays, right forearm x-ray, CT head and C-spine Noncon I independently visualized and interpreted imaging which showed no signs of acute cervical spine fracture, no evidence of acute intracranial abnormality,  I agree with the radiologist  interpretation   Medicines ordered and prescription drug management:  I ordered medication including ibuprofen    I have reviewed the patients home medicines and have made adjustments as needed   Problem List / ED Course:  Considered for admission or further workup however patient's vital signs, physical exam, and imaging are reassuring.  Patient given return precautions.  I feel patient is safe for discharge at this time.        Final diagnoses:  Musculoskeletal pain  Fall, initial encounter    ED Discharge Orders     None          Francis Ileana SAILOR, PA-C 06/13/24 1546    Neysa Caron PARAS, DO 06/13/24 1635

## 2024-06-13 NOTE — ED Provider Triage Note (Signed)
 Emergency Medicine Provider Triage Evaluation Note  Todd Estrada , a 63 y.o. male  was evaluated in triage.  Pt complains of Ble pain and R arm pain, fell 2 days ago. Patient is non verbal and family has left  Review of Systems  Positive: Fall, BLE pain, R arm pain Negative:   Physical Exam  BP 100/73 (BP Location: Left Arm)   Pulse 72   Temp 98.4 F (36.9 C) (Oral)   Resp 18   SpO2 94%  Gen:   Awake, no distress   Resp:  Normal effort  MSK:   Moves extremities without difficulty  Other:  No obvious deformity on exam  Medical Decision Making  Medically screening exam initiated at 1:00 PM.  Appropriate orders placed.  Todd Estrada was informed that the remainder of the evaluation will be completed by another provider, this initial triage assessment does not replace that evaluation, and the importance of remaining in the ED until their evaluation is complete.  Imaging ordered   Francis Ileana SAILOR, PA-C 06/13/24 1301

## 2024-06-13 NOTE — ED Triage Notes (Signed)
 Pt presents to ED, dropped off by family member, for R arm pain, bilateral lower leg pain. Pt is nonverbal d/t previous stroke. Very difficult to understand. Pt may have had a fall 2 days ago. No S/S of distress noted during triage.

## 2024-06-13 NOTE — ED Notes (Signed)
 Family called and stated he will pick patient in the lobby.

## 2024-06-13 NOTE — Discharge Instructions (Signed)
 Today you were seen for musculoskeletal pain after a fall.  You may alternate taking Tylenol /Motrin as needed for pain.  Please return to the ED if you have worsening symptoms, additional falls, or change in mental status.  Thank you for letting us  treat you today. After reviewing your imaging, I feel you are safe to go home. Please follow up with your PCP in the next several days and provide them with your records from this visit. Return to the Emergency Room if pain becomes severe or symptoms worsen.

## 2024-06-14 ENCOUNTER — Encounter (HOSPITAL_COMMUNITY): Payer: Self-pay | Admitting: Interventional Radiology

## 2024-06-15 ENCOUNTER — Emergency Department (HOSPITAL_COMMUNITY)
Admission: EM | Admit: 2024-06-15 | Discharge: 2024-06-15 | Disposition: A | Discharge status: Home / Self Care | Attending: Emergency Medicine | Admitting: Emergency Medicine

## 2024-06-15 ENCOUNTER — Emergency Department (HOSPITAL_COMMUNITY)

## 2024-06-15 DIAGNOSIS — Z7901 Long term (current) use of anticoagulants: Secondary | ICD-10-CM | POA: Diagnosis not present

## 2024-06-15 DIAGNOSIS — Z87891 Personal history of nicotine dependence: Secondary | ICD-10-CM | POA: Diagnosis not present

## 2024-06-15 DIAGNOSIS — Z8673 Personal history of transient ischemic attack (TIA), and cerebral infarction without residual deficits: Secondary | ICD-10-CM | POA: Insufficient documentation

## 2024-06-15 DIAGNOSIS — R300 Dysuria: Secondary | ICD-10-CM | POA: Diagnosis present

## 2024-06-15 DIAGNOSIS — J4489 Other specified chronic obstructive pulmonary disease: Secondary | ICD-10-CM | POA: Insufficient documentation

## 2024-06-15 DIAGNOSIS — Z85118 Personal history of other malignant neoplasm of bronchus and lung: Secondary | ICD-10-CM | POA: Diagnosis not present

## 2024-06-15 DIAGNOSIS — I10 Essential (primary) hypertension: Secondary | ICD-10-CM | POA: Diagnosis not present

## 2024-06-15 LAB — CBC WITH DIFFERENTIAL/PLATELET
Abs Immature Granulocytes: 0.03 10*3/uL (ref 0.00–0.07)
Basophils Absolute: 0 10*3/uL (ref 0.0–0.1)
Basophils Relative: 0 %
Eosinophils Absolute: 0.2 10*3/uL (ref 0.0–0.5)
Eosinophils Relative: 2 %
HCT: 40.3 % (ref 39.0–52.0)
Hemoglobin: 12.6 g/dL — ABNORMAL LOW (ref 13.0–17.0)
Immature Granulocytes: 0 %
Lymphocytes Relative: 13 %
Lymphs Abs: 0.9 10*3/uL (ref 0.7–4.0)
MCH: 29.7 pg (ref 26.0–34.0)
MCHC: 31.3 g/dL (ref 30.0–36.0)
MCV: 95 fL (ref 80.0–100.0)
Monocytes Absolute: 0.5 10*3/uL (ref 0.1–1.0)
Monocytes Relative: 7 %
Neutro Abs: 5.3 10*3/uL (ref 1.7–7.7)
Neutrophils Relative %: 78 %
Platelets: 154 10*3/uL (ref 150–400)
RBC: 4.24 MIL/uL (ref 4.22–5.81)
RDW: 14.2 % (ref 11.5–15.5)
WBC: 6.9 10*3/uL (ref 4.0–10.5)
nRBC: 0 % (ref 0.0–0.2)

## 2024-06-15 LAB — URINALYSIS, W/ REFLEX TO CULTURE (INFECTION SUSPECTED)
Bacteria, UA: NONE SEEN
Bilirubin Urine: NEGATIVE
Glucose, UA: NEGATIVE mg/dL
Hgb urine dipstick: NEGATIVE
Ketones, ur: NEGATIVE mg/dL
Leukocytes,Ua: NEGATIVE
Nitrite: NEGATIVE
Protein, ur: NEGATIVE mg/dL
Specific Gravity, Urine: 1.016 (ref 1.005–1.030)
pH: 6 (ref 5.0–8.0)

## 2024-06-15 LAB — COMPREHENSIVE METABOLIC PANEL WITH GFR
ALT: 15 U/L (ref 0–44)
AST: 20 U/L (ref 15–41)
Albumin: 3.8 g/dL (ref 3.5–5.0)
Alkaline Phosphatase: 85 U/L (ref 38–126)
Anion gap: 11 (ref 5–15)
BUN: 17 mg/dL (ref 8–23)
CO2: 26 mmol/L (ref 22–32)
Calcium: 9.1 mg/dL (ref 8.9–10.3)
Chloride: 105 mmol/L (ref 98–111)
Creatinine, Ser: 0.75 mg/dL (ref 0.61–1.24)
GFR, Estimated: >60 mL/min (ref >60)
Glucose, Bld: 99 mg/dL (ref 70–99)
Potassium: 3.9 mmol/L (ref 3.5–5.1)
Sodium: 142 mmol/L (ref 135–145)
Total Bilirubin: 0.8 mg/dL (ref 0.0–1.2)
Total Protein: 7.1 g/dL (ref 6.5–8.1)

## 2024-06-15 NOTE — ED Provider Notes (Signed)
 [place] EMERGENCY DEPARTMENT AT [facility] Provider Note  CSN: [id number] Arrival date & time: 06/15/24 1231  Chief Complaint(s) No chief complaint on file.  HPI Todd Estrada is a 63 y.o. male history of COPD, hypertension, hyperlipidemia, lung cancer, chronic aphasia due to prior stroke presenting with dysuria.  Patient has chronic aphasia but able to indicate yes/no questions and comprehension intact.  Patient indicates symptoms began yesterday, associated with dysuria, penile discomfort, right flank pain.  Denies any nausea, vomiting, diarrhea, fevers, chills, chest pain, shortness of breath.  No testicular pain.  History slightly limited due to aphasia   Past Medical History Past Medical History:  Diagnosis Date   Asthma    Atypical chest pain 08/13/2022   Community acquired pneumonia 09/14/2022   COPD (chronic obstructive pulmonary disease) (HCC)    Essential hypertension 08/19/2021   GERD (gastroesophageal reflux disease)    HAP (hospital-acquired pneumonia) 09/16/2022   History of tracheostomy    03/09/22-04/11/22   HLD (hyperlipidemia)    Hypertension    Hypokalemia 08/13/2022   Hypomagnesemia 08/13/2022   Lung cancer (HCC)    PAD (peripheral artery disease) (HCC)    Seizures (HCC) 06/02/2022   Sepsis (HCC) 08/13/2022   Sepsis due to pneumonia (HCC) 04/13/2023   Stroke (HCC) 02/2022   Patient Active Problem List   Diagnosis Date Noted   Coronary artery calcification of native artery 04/29/2024   Acute respiratory failure with hypoxia (HCC) 04/29/2024   History of stroke 04/29/2024   PAF (paroxysmal atrial fibrillation) (HCC) 04/28/2024   CAP (community acquired pneumonia) 04/26/2024   Acute cough 04/23/2024   Edema 04/23/2024   CVA (cerebral vascular accident) (HCC) 01/29/2024   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 07/07/2023   Acute gangrenous cholecystitis 07/04/2023   Right upper lobe pneumonia 04/13/2023   Buttock wound, right, subsequent  encounter 03/01/2023   PAD (peripheral artery disease) (HCC) 01/06/2023   Hypotension 01/06/2023   History of pulmonary embolism 10/12/2022   Cervical transverse process fracture (HCC) 09/10/2022   Anemia of chronic disease 08/13/2022   Chronic heart failure with mildly reduced ejection fraction (HFmrEF) (HCC) 08/13/2022   Necrotizing pneumonia (HCC) 08/12/2022   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 Authorizing Provider  albuterol  (PROVENTIL ) (2.5 MG/3ML) 0.083% nebulizer solution Take 3 mLs (2.5 mg total) by nebulization every 4 (four) hours as needed for wheezing or shortness of breath. 08/16/22   Lou Claretta HERO, MD  apixaban  (ELIQUIS ) 5 MG TABS tablet Take 1 tablet (5 mg total) by mouth 2 (two) times daily. 04/07/24   Gregary Sharper, MD  atorvastatin  (LIPITOR) 40 MG tablet Take 1 tablet (40 mg total) by mouth daily. 02/06/24 02/05/25  Justus, Michael, MD  chlorpheniramine-HYDROcodone (TUSSIONEX) 10-8 MG/5ML Take 5 mLs by mouth every 12 (twelve) hours as needed for cough. 04/23/24   Tobie Gaines, DO  clindamycin  (CLEOCIN ) 300 MG capsule Take 1 capsule (300 mg total) by mouth 3 (three) times daily. 05/15/24   [name], MD  dicyclomine  (BENTYL ) 20 MG tablet Take 1 tablet (20 mg  total) by mouth 2 (two) times daily. 06/08/24   Logan Ubaldo NOVAK, PA-C  feeding supplement (ENSURE ENLIVE / ENSURE PLUS) LIQD Take 237 mLs by mouth 2 (two) times daily between meals. 05/03/24   Arlice Reichert, MD  levETIRAcetam  (KEPPRA ) 500 MG  tablet Take 1 tablet (500 mg total) by mouth 2 (two) times daily. 04/18/24   Whitfield Raisin, NP  Multiple Vitamin (MULTIVITAMIN WITH MINERALS) TABS tablet Take 1 tablet by mouth daily.    [provider]  naproxen  (NAPROSYN ) 500 MG tablet Take 1 tablet (500 mg total) by mouth 2 (two) times daily. 06/01/24   Nanavati, Ankit, MD  pantoprazole  (PROTONIX ) 40 MG tablet TAKE 1 TABLET(40 MG) BY MOUTH DAILY Patient taking differently: Take 40 mg by mouth in the morning. 03/04/24   Gregary Sharper, MD  Tiotropium Bromide-Olodaterol (STIOLTO RESPIMAT ) 2.5-2.5 MCG/ACT AERS NEW PRESCRIPTION REQUEST: STIOLTO 2.5 MCG- INHALE TWO PUFFS BY MOUTH DAILY Patient taking differently: Take 2 puffs by mouth daily at 12 noon. 02/07/24   Gregary Sharper, MD                                                                                                                                    Past Surgical History Past Surgical History:  Procedure Laterality Date   BRONCHIAL BIOPSY  05/30/2022   Procedure: BRONCHIAL BIOPSIES;  Surgeon: Shelah Lamar RAMAN, MD;  Location: [facility] ENDOSCOPY;  Service: Pulmonary;;   BRONCHIAL BRUSHINGS  05/30/2022   Procedure: BRONCHIAL BRUSHINGS;  Surgeon: Shelah Lamar RAMAN, MD;  Location: [facility] ENDOSCOPY;  Service: Pulmonary;;   BRONCHIAL NEEDLE ASPIRATION BIOPSY  05/30/2022   Procedure: BRONCHIAL NEEDLE ASPIRATION BIOPSIES;  Surgeon: Shelah Lamar RAMAN, MD;  Location: [facility] ENDOSCOPY;  Service: Pulmonary;;   BRONCHIAL WASHINGS  05/30/2022   Procedure: BRONCHIAL WASHINGS;  Surgeon: Shelah Lamar RAMAN, MD;  Location: [facility] ENDOSCOPY;  Service: Pulmonary;;   CHOLECYSTECTOMY N/A 07/05/2023   Procedure: LAPAROSCOPIC CHOLECYSTECTOMY WITH INTRAOPERATIVE CHOLANGIOGRAM;  Surgeon: Sheldon Standing, MD;  Location: [facility] ORS;  Service: General;  Laterality: N/A;   ERCP N/A 07/06/2023   Procedure: ENDOSCOPIC RETROGRADE CHOLANGIOPANCREATOGRAPHY (ERCP);  Surgeon: Rosalie Kitchens, MD;  Location: [facility] ENDOSCOPY;  Service: Gastroenterology;   Laterality: N/A;   ESOPHAGOGASTRODUODENOSCOPY (EGD) WITH PROPOFOL  N/A 03/11/2022   Procedure: ESOPHAGOGASTRODUODENOSCOPY (EGD) WITH PROPOFOL ;  Surgeon: Sebastian Moles, MD;  Location: [facility] ENDOSCOPY;  Service: General;  Laterality: N/A;   FIDUCIAL MARKER PLACEMENT  05/30/2022   Procedure: FIDUCIAL MARKER PLACEMENT;  Surgeon: Shelah Lamar RAMAN, MD;  Location: [facility] ENDOSCOPY;  Service: Pulmonary;;   IR ANGIO INTRA EXTRACRAN SEL COM CAROTID INNOMINATE UNI R MOD SED  03/02/2022   IR CT HEAD LTD  03/02/2022   IR PERCUTANEOUS ART THROMBECTOMY/INFUSION INTRACRANIAL INC DIAG ANGIO  03/02/2022   PEG PLACEMENT N/A 03/11/2022   Procedure: PERCUTANEOUS ENDOSCOPIC GASTROSTOMY (PEG) PLACEMENT;  Surgeon: Sebastian Moles, MD;  Location: [facility] ENDOSCOPY;  Service: General;  Laterality: N/A;   RADIOLOGY WITH ANESTHESIA  N/A 03/02/2022   Procedure: IR WITH ANESTHESIA;  Surgeon: Dolphus Carrion, MD;  Location: [facility] OR;  Service: Radiology;  Laterality: N/A;   REMOVAL OF STONES  07/06/2023   Procedure: REMOVAL OF STONES;  Surgeon: Rosalie Kitchens, MD;  Location: [facility] ENDOSCOPY;  Service: Gastroenterology;;   ANNETT  07/06/2023   Procedure: ANNETT;  Surgeon: Rosalie Kitchens, MD;  Location: [facility] ENDOSCOPY;  Service: Gastroenterology;;   VIDEO BRONCHOSCOPY WITH RADIAL ENDOBRONCHIAL ULTRASOUND  05/30/2022   Procedure: VIDEO BRONCHOSCOPY WITH RADIAL ENDOBRONCHIAL ULTRASOUND;  Surgeon: Shelah Lamar RAMAN, MD;  Location: [facility] ENDOSCOPY;  Service: Pulmonary;;   Family History Family History  Problem Relation Age of Onset   Throat cancer Mother    Liver cancer Father    Kidney failure Sister    Cancer - Lung Paternal Uncle     Social History Social History   Tobacco Use   Smoking status: Former    Current packs/day: 0.00    Types: Cigarettes    Quit date: 05/02/2022    Years since quitting: 2.1    Passive exposure: Never   Smokeless tobacco: Never  Vaping Use   Vaping status: Never Used  Substance Use Topics   Alcohol use:  Not Currently   Drug use: No   Allergies Patient has no known allergies.  Review of Systems Review of Systems  All other systems reviewed and are negative.   Physical Exam Vital Signs  I have reviewed the triage vital signs BP (!) 152/84 (BP Location: Left Arm)   Pulse 64   Temp 98.1 F (36.7 C) (Oral)   Resp 18   SpO2 98%  Physical Exam Vitals and nursing note reviewed.  Constitutional:      General: He is not in acute distress.    Appearance: Normal appearance.  HENT:     Mouth/Throat:     Mouth: Mucous membranes are moist.   Eyes:     Conjunctiva/sclera: Conjunctivae normal.    Cardiovascular:     Rate and Rhythm: Normal rate and regular rhythm.  Pulmonary:     Effort: Pulmonary effort is normal. No respiratory distress.     Breath sounds: Normal breath sounds.  Abdominal:     General: Abdomen is flat.     Palpations: Abdomen is soft.     Tenderness: There is no abdominal tenderness. There is no right CVA tenderness or left CVA tenderness.  Genitourinary:    Penis: Normal.      Testes: Normal.     Comments: Chaperone by RN  Musculoskeletal:     Right lower leg: No edema.     Left lower leg: No edema.   Skin:    General: Skin is warm and dry.     Capillary Refill: Capillary refill takes less than 2 seconds.   Neurological:     Mental Status: He is alert. Mental status is at baseline.     Comments: Chronic aphasia right-sided weakness but comprehension seems intact and able to participate in interview  Psychiatric:        Mood and Affect: Mood normal.        Behavior: Behavior normal.     ED Results and Treatments Labs (all labs ordered are listed, but only abnormal results are displayed) Labs Reviewed  CBC WITH DIFFERENTIAL/PLATELET - Abnormal; Notable for the following components:      Result Value   Hemoglobin 12.6 (*)    All other components within normal limits  COMPREHENSIVE METABOLIC PANEL WITH GFR  URINALYSIS, W/  REFLEX TO CULTURE  (INFECTION SUSPECTED)                                                                                                                          Radiology CT Renal Stone Study Result Date: 06/15/2024 CLINICAL DATA:  Abdominal/flank pain, stone suspected EXAM: CT ABDOMEN AND PELVIS WITHOUT CONTRAST TECHNIQUE: Multidetector CT imaging of the abdomen and pelvis was performed following the standard protocol without IV contrast. RADIATION DOSE REDUCTION: This exam was performed according to the departmental dose-optimization program which includes automated exposure control, adjustment of the mA and/or kV according to patient size and/or use of iterative reconstruction technique. COMPARISON:  CT abdomen pelvis 06/08/2024 FINDINGS: Lower chest: Bilateral atelectasis.  Coronary artery calcification. Hepatobiliary: No focal liver abnormality. Status post cholecystectomy. No biliary dilatation. Pancreas: Diffusely atrophic. No focal lesion. Otherwise normal pancreatic contour. No surrounding inflammatory changes. No main pancreatic ductal dilatation. Spleen: Normal in size without focal abnormality. Adrenals/Urinary Tract: No adrenal nodule bilaterally. The majority of calcifications within the kidneys are vascular in origin. Couple punctate right nephrolithiasis. No left nephrolithiasis. No hydronephrosis. No definite contour-deforming renal mass. No ureterolithiasis or hydroureter. The urinary bladder is unremarkable. Stomach/Bowel: Stomach is within normal limits. No evidence of bowel wall thickening or dilatation. Appendix appears normal. Vascular/Lymphatic: No abdominal aorta or iliac aneurysm. Severe atherosclerotic plaque of the aorta and its branches. No abdominal, pelvic, or inguinal lymphadenopathy. Reproductive: Prostate is unremarkable. Other: No intraperitoneal free fluid. No intraperitoneal free gas. No organized fluid collection. Musculoskeletal: A fat containing supraumbilical ventral hernia (2:21).  Possible anti mesenteric wall of a short segment of the transverse colon associated with the hernia. No suspicious lytic or blastic osseous lesions. No acute displaced fracture. IMPRESSION: 1. Tiny fat containing Richter hernia containing the anti mesenteric wall of a short segment of transverse colon. 2. Nonobstructive punctate right nephrolithiasis. The remainder of the calcifications within the kidneys are likely vascular in origin. 3.  Aortic Atherosclerosis (ICD10-I70.0). Electronically Signed   By: Morgane  Naveau M.D.   On: 06/15/2024 14:26    Pertinent labs & imaging results that were available during my care of the patient were reviewed by me and considered in my medical decision making (see MDM for details).  Medications Ordered in ED Medications - No data to display  Procedures Procedures  (including critical care time)  Medical Decision Making / ED Course   MDM:  63 year old presented to the emergency department dysuria.  Patient overall well-appearing, physical examination with no focal finding.  Symptoms seem most consistent with urinary infection.  Will check urinalysis, basic labs.  No abdominal tenderness, CVA tenderness to suggest pyelonephritis, kidney stone however given flank pain and age will check CT renal stone study.  Will reassess.  Low concern for testicular processes torsion, epididymitis without testicular pain or tenderness.  Low concern for other intra-abdominal process like diverticulitis, appendicitis without abdominal tenderness or  Clinical Course as of 06/15/24 1832  Sat Jun 15, 2024  1831 Workup negative including urinalysis, blood work, CT scan. Will discharge patient to home. All questions answered. Patient comfortable with plan of discharge. Return precautions discussed with patient and specified on the after visit  summary.  [WS]    Clinical Course User Index [WS] Francesca Elsie CROME, MD     Additional history obtained: -Additional history obtained from ems -External records from outside source obtained and reviewed including: Chart review including previous notes, labs, imaging, consultation notes including prior notes    Lab Tests: -I ordered, reviewed, and interpreted labs.   The pertinent results include:   Labs Reviewed  CBC WITH DIFFERENTIAL/PLATELET - Abnormal; Notable for the following components:      Result Value   Hemoglobin 12.6 (*)    All other components within normal limits  COMPREHENSIVE METABOLIC PANEL WITH GFR  URINALYSIS, W/ REFLEX TO CULTURE (INFECTION SUSPECTED)    Notable for normal UA   Imaging Studies ordered: I ordered imaging studies including CT renal stone  On my interpretation imaging demonstrates no acute process I independently visualized and interpreted imaging. I agree with the radiologist interpretation   Medicines ordered and prescription drug management: No orders of the defined types were placed in this encounter.   -I have reviewed the patients home medicines and have made adjustments as needed  Co morbidities that complicate the patient evaluation  Past Medical History:  Diagnosis Date   Asthma    Atypical chest pain 08/13/2022   Community acquired pneumonia 09/14/2022   COPD (chronic obstructive pulmonary disease) (HCC)    Essential hypertension 08/19/2021   GERD (gastroesophageal reflux disease)    HAP (hospital-acquired pneumonia) 09/16/2022   History of tracheostomy    03/09/22-04/11/22   HLD (hyperlipidemia)    Hypertension    Hypokalemia 08/13/2022   Hypomagnesemia 08/13/2022   Lung cancer (HCC)    PAD (peripheral artery disease) (HCC)    Seizures (HCC) 06/02/2022   Sepsis (HCC) 08/13/2022   Sepsis due to pneumonia (HCC) 04/13/2023   Stroke (HCC) 02/2022      Dispostion: Disposition decision including need for  hospitalization was considered, and patient discharged from emergency department.    Final Clinical Impression(s) / ED Diagnoses Final diagnoses:  Dysuria     This chart was dictated using voice recognition software.  Despite best efforts to proofread,  errors can occur which can change the documentation meaning.    Francesca Elsie CROME, MD 06/15/24 [phone]

## 2024-06-15 NOTE — ED Triage Notes (Signed)
 Per EMS from home. Pt reports pain in right abdomen, back upon urination. Burning urination.  94 on RA BP 138/80 HR 61  RR16 CBG 131

## 2024-06-15 NOTE — Discharge Instructions (Signed)
 We evaluated you for your dysuria.  Your testing in the emergency department is reassuring.  We did not see a dangerous cause of your symptoms likely UTI.  It should be safe to go home.  Please follow-up with your primary doctor.

## 2024-06-18 ENCOUNTER — Other Ambulatory Visit: Payer: Self-pay | Admitting: Student

## 2024-06-21 ENCOUNTER — Emergency Department (HOSPITAL_COMMUNITY)

## 2024-06-21 ENCOUNTER — Other Ambulatory Visit: Payer: Self-pay

## 2024-06-21 ENCOUNTER — Emergency Department (HOSPITAL_COMMUNITY)
Admission: EM | Admit: 2024-06-21 | Discharge: 2024-06-21 | Disposition: A | Discharge status: Home / Self Care | Attending: Emergency Medicine | Admitting: Emergency Medicine

## 2024-06-21 ENCOUNTER — Encounter (HOSPITAL_COMMUNITY): Payer: Self-pay

## 2024-06-21 DIAGNOSIS — Z7901 Long term (current) use of anticoagulants: Secondary | ICD-10-CM | POA: Insufficient documentation

## 2024-06-21 DIAGNOSIS — R4701 Aphasia: Secondary | ICD-10-CM | POA: Insufficient documentation

## 2024-06-21 DIAGNOSIS — R1084 Generalized abdominal pain: Secondary | ICD-10-CM | POA: Diagnosis not present

## 2024-06-21 DIAGNOSIS — R109 Unspecified abdominal pain: Secondary | ICD-10-CM | POA: Diagnosis present

## 2024-06-21 LAB — BASIC METABOLIC PANEL WITH GFR
Anion gap: 9 (ref 5–15)
BUN: 13 mg/dL (ref 8–23)
CO2: 27 mmol/L (ref 22–32)
Calcium: 9.2 mg/dL (ref 8.9–10.3)
Chloride: 107 mmol/L (ref 98–111)
Creatinine, Ser: 0.91 mg/dL (ref 0.61–1.24)
GFR, Estimated: >60 mL/min (ref >60)
Glucose, Bld: 103 mg/dL — ABNORMAL HIGH (ref 70–99)
Potassium: 3.8 mmol/L (ref 3.5–5.1)
Sodium: 143 mmol/L (ref 135–145)

## 2024-06-21 LAB — CBC
HCT: 40.1 % (ref 39.0–52.0)
Hemoglobin: 12.8 g/dL — ABNORMAL LOW (ref 13.0–17.0)
MCH: 29.6 pg (ref 26.0–34.0)
MCHC: 31.9 g/dL (ref 30.0–36.0)
MCV: 92.6 fL (ref 80.0–100.0)
Platelets: 162 K/uL (ref 150–400)
RBC: 4.33 MIL/uL (ref 4.22–5.81)
RDW: 14.1 % (ref 11.5–15.5)
WBC: 4.7 K/uL (ref 4.0–10.5)
nRBC: 0 % (ref 0.0–0.2)

## 2024-06-21 LAB — TROPONIN I (HIGH SENSITIVITY)
Troponin I (High Sensitivity): 4 ng/L (ref <18)
Troponin I (High Sensitivity): 5 ng/L (ref <18)

## 2024-06-21 NOTE — ED Triage Notes (Signed)
 Patient has upper abdominal pain along with vomiting. Has left sided chest pain that began today.

## 2024-06-21 NOTE — Discharge Instructions (Addendum)
 Follow-up with gastroenterology for your somewhat chronic abdominal pain.

## 2024-06-21 NOTE — ED Provider Notes (Signed)
 [place] EMERGENCY DEPARTMENT AT [facility] Provider Note   CSN: [id number] Arrival date & time: 06/21/24  1407     Patient presents with: Abdominal Pain   Todd Estrada is a 63 y.o. male.    Abdominal Pain Patient with abdominal pain.  Appears to be somewhat chronic.  Difficult history due to patient's aphasia.  Points on abdomen.  Has had multiple visits for similar symptoms over the last few months.  Has had 5 CTs in the last 6 months.  Has had 11 ER visits in the last 6 months.  Will nod yes or no.  Thinks it is worse after eating.  Denies vomiting     Prior to Admission medications   Medication Sig Start Date End Date Taking? Authorizing Provider  albuterol  (PROVENTIL ) (2.5 MG/3ML) 0.083% nebulizer solution Take 3 mLs (2.5 mg total) by nebulization every 4 (four) hours as needed for wheezing or shortness of breath. 08/16/22   Lou Claretta HERO, MD  apixaban  (ELIQUIS ) 5 MG TABS tablet Take 1 tablet (5 mg total) by mouth 2 (two) times daily. 04/07/24   Gregary Sharper, MD  atorvastatin  (LIPITOR) 40 MG tablet Take 1 tablet (40 mg total) by mouth daily. 02/06/24 02/05/25  Justus, Michael, MD  chlorpheniramine-HYDROcodone (TUSSIONEX) 10-8 MG/5ML Take 5 mLs by mouth every 12 (twelve) hours as needed for cough. 04/23/24   Tobie Gaines, DO  clindamycin  (CLEOCIN ) 300 MG capsule Take 1 capsule (300 mg total) by mouth 3 (three) times daily. 05/15/24   [name], MD  dicyclomine  (BENTYL ) 20 MG tablet Take 1 tablet (20 mg total) by mouth 2 (two) times daily. 06/08/24   Logan Ubaldo NOVAK, PA-C  feeding supplement (ENSURE ENLIVE / ENSURE PLUS) LIQD Take 237 mLs by mouth 2 (two) times daily between meals. 05/03/24   Arlice Reichert, MD  levETIRAcetam  (KEPPRA ) 500 MG tablet Take 1 tablet (500 mg total) by mouth 2 (two) times daily. 04/18/24   Whitfield Raisin, NP  Multiple Vitamin (MULTIVITAMIN WITH MINERALS) TABS tablet Take 1 tablet by mouth daily.    [provider]  naproxen   (NAPROSYN ) 500 MG tablet Take 1 tablet (500 mg total) by mouth 2 (two) times daily. 06/01/24   Charlyn Sora, MD  pantoprazole  (PROTONIX ) 40 MG tablet TAKE 1 TABLET(40 MG) BY MOUTH DAILY 06/18/24   [name], [name], DO  Tiotropium Bromide-Olodaterol (STIOLTO RESPIMAT ) 2.5-2.5 MCG/ACT AERS NEW PRESCRIPTION REQUEST: STIOLTO 2.5 MCG- INHALE TWO PUFFS BY MOUTH DAILY Patient taking differently: Take 2 puffs by mouth daily at 12 noon. 02/07/24   Gregary Sharper, MD    Allergies: Patient has no known allergies.    Review of Systems  Gastrointestinal:  Positive for abdominal pain.    Updated Vital Signs BP 121/81 (BP Location: Right Arm)   Pulse 82   Temp 98.2 F (36.8 C) (Oral)   Resp 18   Ht 6' 1 (1.854 m)   Wt 86 kg   SpO2 98%   BMI 25.00 kg/m   Physical Exam Vitals and nursing note reviewed.  Abdominal:     General: There is no distension.     Tenderness: There is no abdominal tenderness.  Neurological:     Mental Status: He is alert.     Comments: Patient with aphasia.     (all labs ordered are listed, but only abnormal results are displayed) Labs Reviewed  BASIC METABOLIC PANEL WITH GFR - Abnormal; Notable for the following components:      Result Value  Glucose, Bld 103 (*)    All other components within normal limits  CBC - Abnormal; Notable for the following components:   Hemoglobin 12.8 (*)    All other components within normal limits  TROPONIN I (HIGH SENSITIVITY)  TROPONIN I (HIGH SENSITIVITY)    EKG: EKG Interpretation Date/Time:  Friday June 21 2024 15:24:50 EDT Ventricular Rate:  70 PR Interval:  158 QRS Duration:  105 QT Interval:  394 QTC Calculation: 426 R Axis:   64  Text Interpretation: Sinus rhythm Ventricular bigeminy Low voltage, precordial leads Confirmed by Patsey Lot [phone]) on 06/21/2024 4:05:58 PM  Radiology: [facility] Chest 2 View Result Date: 06/21/2024 CLINICAL DATA:  Chest pain EXAM: CHEST - 2 VIEW COMPARISON:  06/04/2024 FINDINGS:  Areas of perihilar and left upper lobe scarring again noted, unchanged. Peripheral opacity surrounding the fiducial marker in the lateral right upper lobe also not significantly changed. No acute infiltrate or effusion. Heart and mediastinal contours are within normal limits. IMPRESSION: Stable post treatment scarring in the lungs bilaterally. No definite acute process. Electronically Signed   By: Franky Crease M.D.   On: 06/21/2024 15:14     Procedures   Medications Ordered in the ED - No data to display                                  Medical Decision Making Amount and/or Complexity of Data Reviewed Labs: ordered. Radiology: ordered.   Patient with abdominal pain.  Appears somewhat acute on chronic.  Recent CT scans reviewed and reassuring.  No obstruction seen.  Does have a few mesenteric artery occlusion.  Benign exam with potentially could have some mesenteric ischemia.  Has had negative lactates in the past.  Will have patient follow-up with GI who he is seen before.  Also potential chest pain.  EKG reassuring.  Did have some PVCs.  Will discharge home.  Doubt cardiac ischemia.      Final diagnoses:  Generalized abdominal pain    ED Discharge Orders     None          Patsey Lot, MD 06/21/24 1700

## 2024-06-26 ENCOUNTER — Encounter (HOSPITAL_COMMUNITY): Payer: Self-pay

## 2024-06-26 ENCOUNTER — Emergency Department (HOSPITAL_COMMUNITY)
Admission: EM | Admit: 2024-06-26 | Discharge: 2024-06-26 | Disposition: A | Discharge status: Home / Self Care | Attending: Emergency Medicine | Admitting: Emergency Medicine

## 2024-06-26 ENCOUNTER — Other Ambulatory Visit: Payer: Self-pay

## 2024-06-26 DIAGNOSIS — Z8673 Personal history of transient ischemic attack (TIA), and cerebral infarction without residual deficits: Secondary | ICD-10-CM | POA: Insufficient documentation

## 2024-06-26 DIAGNOSIS — I10 Essential (primary) hypertension: Secondary | ICD-10-CM | POA: Diagnosis not present

## 2024-06-26 DIAGNOSIS — G8929 Other chronic pain: Secondary | ICD-10-CM | POA: Diagnosis not present

## 2024-06-26 DIAGNOSIS — J449 Chronic obstructive pulmonary disease, unspecified: Secondary | ICD-10-CM | POA: Diagnosis not present

## 2024-06-26 DIAGNOSIS — Z7901 Long term (current) use of anticoagulants: Secondary | ICD-10-CM | POA: Diagnosis not present

## 2024-06-26 DIAGNOSIS — R1030 Lower abdominal pain, unspecified: Secondary | ICD-10-CM | POA: Diagnosis present

## 2024-06-26 DIAGNOSIS — R300 Dysuria: Secondary | ICD-10-CM | POA: Diagnosis not present

## 2024-06-26 LAB — URINALYSIS, ROUTINE W REFLEX MICROSCOPIC
Bilirubin Urine: NEGATIVE
Glucose, UA: NEGATIVE mg/dL
Hgb urine dipstick: NEGATIVE
Ketones, ur: NEGATIVE mg/dL
Leukocytes,Ua: NEGATIVE
Nitrite: NEGATIVE
Protein, ur: NEGATIVE mg/dL
Specific Gravity, Urine: 1.02 (ref 1.005–1.030)
pH: 5 (ref 5.0–8.0)

## 2024-06-26 LAB — COMPREHENSIVE METABOLIC PANEL WITH GFR
ALT: 18 U/L (ref 0–44)
AST: 21 U/L (ref 15–41)
Albumin: 4.3 g/dL (ref 3.5–5.0)
Alkaline Phosphatase: 94 U/L (ref 38–126)
Anion gap: 13 (ref 5–15)
BUN: 17 mg/dL (ref 8–23)
CO2: 25 mmol/L (ref 22–32)
Calcium: 9.6 mg/dL (ref 8.9–10.3)
Chloride: 105 mmol/L (ref 98–111)
Creatinine, Ser: 0.64 mg/dL (ref 0.61–1.24)
GFR, Estimated: >60 mL/min
Glucose, Bld: 101 mg/dL — ABNORMAL HIGH (ref 70–99)
Potassium: 3.8 mmol/L (ref 3.5–5.1)
Sodium: 143 mmol/L (ref 135–145)
Total Bilirubin: 0.9 mg/dL (ref 0.0–1.2)
Total Protein: 7.5 g/dL (ref 6.5–8.1)

## 2024-06-26 LAB — CBC WITH DIFFERENTIAL/PLATELET
Abs Immature Granulocytes: 0.02 K/uL (ref 0.00–0.07)
Basophils Absolute: 0 K/uL (ref 0.0–0.1)
Basophils Relative: 0 %
Eosinophils Absolute: 0.2 K/uL (ref 0.0–0.5)
Eosinophils Relative: 3 %
HCT: 41.7 % (ref 39.0–52.0)
Hemoglobin: 13.3 g/dL (ref 13.0–17.0)
Immature Granulocytes: 0 %
Lymphocytes Relative: 12 %
Lymphs Abs: 0.8 K/uL (ref 0.7–4.0)
MCH: 29.6 pg (ref 26.0–34.0)
MCHC: 31.9 g/dL (ref 30.0–36.0)
MCV: 92.7 fL (ref 80.0–100.0)
Monocytes Absolute: 0.7 K/uL (ref 0.1–1.0)
Monocytes Relative: 10 %
Neutro Abs: 5.4 K/uL (ref 1.7–7.7)
Neutrophils Relative %: 75 %
Platelets: 157 K/uL (ref 150–400)
RBC: 4.5 MIL/uL (ref 4.22–5.81)
RDW: 14.1 % (ref 11.5–15.5)
WBC: 7.2 K/uL (ref 4.0–10.5)
nRBC: 0 % (ref 0.0–0.2)

## 2024-06-26 LAB — LIPASE, BLOOD: Lipase: 30 U/L (ref 11–51)

## 2024-06-26 NOTE — ED Provider Notes (Signed)
 [place] EMERGENCY DEPARTMENT AT [facility] Provider Note   CSN: [id number] Arrival date & time: 06/26/24  1500     Patient presents with: Abdominal Pain   Todd Estrada is a 63 y.o. male.   63 year old male with history of chronic abdominal pain, COPD, hypertension, hyperlipidemia, and chronic aphasia due to prior stroke presents emergency department with lower abdominal pain and dysuria.  History obtained per the patient's cousin Sydnee) and confirmed with the patient since he is aphasic.  Appears that yesterday started having some dysuria.  Also has some suprapubic pain.  No fevers, nausea or vomiting, flank pain, or diarrhea.  Presents frequently to the emergency department for abdominal pain and has had 5 CTs this year already.       Prior to Admission medications   Medication Sig Start Date End Date Taking? Authorizing Provider  albuterol  (PROVENTIL ) (2.5 MG/3ML) 0.083% nebulizer solution Take 3 mLs (2.5 mg total) by nebulization every 4 (four) hours as needed for wheezing or shortness of breath. 08/16/22   Lou Claretta HERO, MD  apixaban  (ELIQUIS ) 5 MG TABS tablet Take 1 tablet (5 mg total) by mouth 2 (two) times daily. 04/07/24   Gregary Sharper, MD  atorvastatin  (LIPITOR) 40 MG tablet Take 1 tablet (40 mg total) by mouth daily. 02/06/24 02/05/25  Justus, Michael, MD  chlorpheniramine-HYDROcodone (TUSSIONEX) 10-8 MG/5ML Take 5 mLs by mouth every 12 (twelve) hours as needed for cough. 04/23/24   Tobie Gaines, DO  clindamycin  (CLEOCIN ) 300 MG capsule Take 1 capsule (300 mg total) by mouth 3 (three) times daily. 05/15/24   [name], MD  dicyclomine  (BENTYL ) 20 MG tablet Take 1 tablet (20 mg total) by mouth 2 (two) times daily. 06/08/24   Logan Ubaldo NOVAK, PA-C  feeding supplement (ENSURE ENLIVE / ENSURE PLUS) LIQD Take 237 mLs by mouth 2 (two) times daily between meals. 05/03/24   Arlice Reichert, MD  levETIRAcetam  (KEPPRA ) 500 MG tablet Take 1 tablet (500 mg total) by  mouth 2 (two) times daily. 04/18/24   Whitfield Raisin, NP  Multiple Vitamin (MULTIVITAMIN WITH MINERALS) TABS tablet Take 1 tablet by mouth daily.    [provider]  naproxen  (NAPROSYN ) 500 MG tablet Take 1 tablet (500 mg total) by mouth 2 (two) times daily. 06/01/24   Charlyn Sora, MD  pantoprazole  (PROTONIX ) 40 MG tablet TAKE 1 TABLET(40 MG) BY MOUTH DAILY 06/18/24   [name], [name], DO  Tiotropium Bromide-Olodaterol (STIOLTO RESPIMAT ) 2.5-2.5 MCG/ACT AERS NEW PRESCRIPTION REQUEST: STIOLTO 2.5 MCG- INHALE TWO PUFFS BY MOUTH DAILY Patient taking differently: Take 2 puffs by mouth daily at 12 noon. 02/07/24   Gregary Sharper, MD    Allergies: Patient has no known allergies.    Review of Systems  Updated Vital Signs BP 125/85 (BP Location: Left Arm)   Pulse 68   Temp 98.2 F (36.8 C) (Oral)   Resp 18   SpO2 100%   Physical Exam Vitals and nursing note reviewed.  Constitutional:      General: He is not in acute distress.    Appearance: He is well-developed.  HENT:     Head: Normocephalic and atraumatic.     Right Ear: External ear normal.     Left Ear: External ear normal.     Nose: Nose normal.  Eyes:     Extraocular Movements: Extraocular movements intact.     Conjunctiva/sclera: Conjunctivae normal.     Pupils: Pupils are equal, round, and reactive to light.  Abdominal:  General: There is no distension.     Palpations: Abdomen is soft. There is no mass.     Tenderness: There is no abdominal tenderness. There is no right CVA tenderness, left CVA tenderness or guarding.  Genitourinary:    Penis: Normal.      Testes: Normal.  Musculoskeletal:     Cervical back: Normal range of motion and neck supple.  Skin:    General: Skin is warm and dry.  Neurological:     Mental Status: He is alert. Mental status is at baseline.  Psychiatric:        Mood and Affect: Mood normal.        Behavior: Behavior normal.     (all labs ordered are listed, but only abnormal  results are displayed) Labs Reviewed  COMPREHENSIVE METABOLIC PANEL WITH GFR - Abnormal; Notable for the following components:      Result Value   Glucose, Bld 101 (*)    All other components within normal limits  LIPASE, BLOOD  CBC WITH DIFFERENTIAL/PLATELET  URINALYSIS, ROUTINE W REFLEX MICROSCOPIC    EKG: None  Radiology: No results found.   Procedures   Medications Ordered in the ED - No data to display  Clinical Course as of 06/26/24 2104  Wed Jun 26, 2024  2041 200 mL of urine when cath'ed [RP]    Clinical Course User Index [RP] Yolande Lamar BROCKS, MD                                 Medical Decision Making Amount and/or Complexity of Data Reviewed Labs: ordered.   63 year old male with history of chronic abdominal pain, COPD, hypertension, hyperlipidemia, and chronic aphasia due to prior stroke presents emergency department with lower abdominal pain and dysuria.   Initial Ddx:  Chronic abdominal pain, UTI, diverticulitis, appendicitis, SBO  MDM/Course:  Patient presents emergency department suprapubic abdominal pain and dysuria for a day.  Does come in frequently to the emergency department for his abdominal pain.  Has already had 5 CTs this year.  His abdominal exam is very reassuring is not having any tenderness.  No rebound or guarding.  No hernias noted.  No abnormalities on GU exam.  He had blood work that was unremarkable.  His urinalysis was not consistent with UTI.  Suspect that his pain is likely from his chronic abdominal pain.  Since he is not having any nausea or vomiting or abdominal bloating or focal tenderness in his abdomen do not feel that repeat CT is indicated today.   This patient presents to the ED for concern of complaints listed in HPI, this involves an extensive number of treatment options, and is a complaint that carries with it a high risk of complications and morbidity. Disposition including potential need for admission considered.    Dispo: DC Home. Return precautions discussed including, but not limited to, those listed in the AVS. Allowed pt time to ask questions which were answered fully prior to dc.  Additional history obtained from cousin Records reviewed Outpatient Clinic Notes The following labs were independently interpreted: Urinalysis and show no acute abnormality I personally reviewed and interpreted cardiac monitoring: normal sinus rhythm  I have reviewed the patients home medications and made adjustments as needed  Portions of this note were generated with Dragon dictation software. Dictation errors may occur despite best attempts at proofreading.     Final diagnoses:  Dysuria  Chronic abdominal pain  ED Discharge Orders     None          Yolande Lamar BROCKS, MD 06/26/24 2104

## 2024-06-26 NOTE — ED Triage Notes (Signed)
 Pt reports that his abdomen and penis are hurting today.

## 2024-06-26 NOTE — Discharge Instructions (Signed)
 You were seen for abdominal pain and pain with urination in the emergency department.   At home, please take tylenol  for your pain.    Check your MyChart online for the results of any tests that had not resulted by the time you left the emergency department.   Follow-up with your primary doctor in 2-3 days regarding your visit.  Follow-up with a urologist if your symptoms do not improve in 1 to 2 weeks.  Return immediately to the emergency department if you experience any of the following: Worsening pain, fever, vomiting, or any other concerning symptoms.    Thank you for visiting our Emergency Department. It was a pleasure taking care of you today.

## 2024-06-30 ENCOUNTER — Emergency Department (HOSPITAL_COMMUNITY)

## 2024-06-30 ENCOUNTER — Observation Stay (HOSPITAL_COMMUNITY)
Admission: EM | Admit: 2024-06-30 | Discharge: 2024-07-02 | Disposition: A | Discharge status: Home / Self Care | Attending: Internal Medicine | Admitting: Internal Medicine

## 2024-06-30 ENCOUNTER — Encounter (HOSPITAL_COMMUNITY): Payer: Self-pay

## 2024-06-30 ENCOUNTER — Other Ambulatory Visit: Payer: Self-pay

## 2024-06-30 DIAGNOSIS — Z79899 Other long term (current) drug therapy: Secondary | ICD-10-CM | POA: Insufficient documentation

## 2024-06-30 DIAGNOSIS — Z7901 Long term (current) use of anticoagulants: Secondary | ICD-10-CM | POA: Diagnosis not present

## 2024-06-30 DIAGNOSIS — Z8673 Personal history of transient ischemic attack (TIA), and cerebral infarction without residual deficits: Secondary | ICD-10-CM | POA: Insufficient documentation

## 2024-06-30 DIAGNOSIS — Z1152 Encounter for screening for COVID-19: Secondary | ICD-10-CM | POA: Insufficient documentation

## 2024-06-30 DIAGNOSIS — G40909 Epilepsy, unspecified, not intractable, without status epilepticus: Secondary | ICD-10-CM | POA: Insufficient documentation

## 2024-06-30 DIAGNOSIS — J449 Chronic obstructive pulmonary disease, unspecified: Secondary | ICD-10-CM | POA: Insufficient documentation

## 2024-06-30 DIAGNOSIS — I110 Hypertensive heart disease with heart failure: Secondary | ICD-10-CM | POA: Diagnosis not present

## 2024-06-30 DIAGNOSIS — I10 Essential (primary) hypertension: Secondary | ICD-10-CM | POA: Diagnosis not present

## 2024-06-30 DIAGNOSIS — Z87891 Personal history of nicotine dependence: Secondary | ICD-10-CM | POA: Diagnosis not present

## 2024-06-30 DIAGNOSIS — I69351 Hemiplegia and hemiparesis following cerebral infarction affecting right dominant side: Secondary | ICD-10-CM | POA: Diagnosis not present

## 2024-06-30 DIAGNOSIS — G8191 Hemiplegia, unspecified affecting right dominant side: Secondary | ICD-10-CM | POA: Diagnosis not present

## 2024-06-30 DIAGNOSIS — R4701 Aphasia: Secondary | ICD-10-CM | POA: Insufficient documentation

## 2024-06-30 DIAGNOSIS — R079 Chest pain, unspecified: Secondary | ICD-10-CM | POA: Diagnosis present

## 2024-06-30 DIAGNOSIS — C3490 Malignant neoplasm of unspecified part of unspecified bronchus or lung: Secondary | ICD-10-CM | POA: Insufficient documentation

## 2024-06-30 DIAGNOSIS — I2699 Other pulmonary embolism without acute cor pulmonale: Principal | ICD-10-CM | POA: Diagnosis present

## 2024-06-30 DIAGNOSIS — I69320 Aphasia following cerebral infarction: Secondary | ICD-10-CM | POA: Diagnosis not present

## 2024-06-30 DIAGNOSIS — F10920 Alcohol use, unspecified with intoxication, uncomplicated: Secondary | ICD-10-CM | POA: Insufficient documentation

## 2024-06-30 DIAGNOSIS — I5030 Unspecified diastolic (congestive) heart failure: Secondary | ICD-10-CM | POA: Diagnosis not present

## 2024-06-30 DIAGNOSIS — Z86711 Personal history of pulmonary embolism: Secondary | ICD-10-CM | POA: Diagnosis present

## 2024-06-30 DIAGNOSIS — R0602 Shortness of breath: Secondary | ICD-10-CM | POA: Diagnosis present

## 2024-06-30 LAB — BASIC METABOLIC PANEL WITH GFR
Anion gap: 9 (ref 5–15)
BUN: 13 mg/dL (ref 8–23)
CO2: 27 mmol/L (ref 22–32)
Calcium: 9.3 mg/dL (ref 8.9–10.3)
Chloride: 103 mmol/L (ref 98–111)
Creatinine, Ser: 0.84 mg/dL (ref 0.61–1.24)
GFR, Estimated: >60 mL/min (ref >60)
Glucose, Bld: 109 mg/dL — ABNORMAL HIGH (ref 70–99)
Potassium: 4.6 mmol/L (ref 3.5–5.1)
Sodium: 139 mmol/L (ref 135–145)

## 2024-06-30 LAB — RESP PANEL BY RT-PCR (RSV, FLU A&B, COVID)  RVPGX2
Influenza A by PCR: NEGATIVE
Influenza B by PCR: NEGATIVE
Resp Syncytial Virus by PCR: NEGATIVE
SARS Coronavirus 2 by RT PCR: NEGATIVE

## 2024-06-30 LAB — D-DIMER, QUANTITATIVE: D-Dimer, Quant: 1.19 ug{FEU}/mL — ABNORMAL HIGH (ref 0.00–0.50)

## 2024-06-30 LAB — CBC
HCT: 40.7 % (ref 39.0–52.0)
Hemoglobin: 13.1 g/dL (ref 13.0–17.0)
MCH: 29.4 pg (ref 26.0–34.0)
MCHC: 32.2 g/dL (ref 30.0–36.0)
MCV: 91.5 fL (ref 80.0–100.0)
Platelets: 158 K/uL (ref 150–400)
RBC: 4.45 MIL/uL (ref 4.22–5.81)
RDW: 13.9 % (ref 11.5–15.5)
WBC: 6.3 K/uL (ref 4.0–10.5)
nRBC: 0 % (ref 0.0–0.2)

## 2024-06-30 LAB — TROPONIN I (HIGH SENSITIVITY): Troponin I (High Sensitivity): 4 ng/L (ref <18)

## 2024-06-30 LAB — LIPASE, BLOOD: Lipase: 28 U/L (ref 11–51)

## 2024-06-30 MED ORDER — PROCHLORPERAZINE EDISYLATE 10 MG/2ML IJ SOLN: 5.0000 mg | Freq: Four times a day (QID) | INTRAMUSCULAR | Status: DC | PRN

## 2024-06-30 MED ORDER — ALBUTEROL SULFATE (2.5 MG/3ML) 0.083% IN NEBU: 2.5000 mg | INHALATION_SOLUTION | RESPIRATORY_TRACT | Status: DC | PRN

## 2024-06-30 MED ORDER — POLYETHYLENE GLYCOL 3350 17 G PO PACK: 17.0000 g | PACK | Freq: Every day | ORAL | Status: DC | PRN

## 2024-06-30 MED ORDER — UMECLIDINIUM BROMIDE 62.5 MCG/ACT IN AEPB
1.0000 | INHALATION_SPRAY | Freq: Every day | RESPIRATORY_TRACT | Status: DC
  Filled 2024-06-30 (×2): qty 7

## 2024-06-30 MED ORDER — HYOSCYAMINE SULFATE 0.125 MG SL SUBL
0.2500 mg | SUBLINGUAL_TABLET | Freq: Once | SUBLINGUAL | Status: AC
  Administered 2024-06-30: 0.25 mg via SUBLINGUAL
  Filled 2024-06-30: qty 2

## 2024-06-30 MED ORDER — LEVETIRACETAM 500 MG PO TABS
500.0000 mg | ORAL_TABLET | Freq: Two times a day (BID) | ORAL | Status: DC
  Administered 2024-06-30 – 2024-07-02 (×4): 500 mg via ORAL
  Filled 2024-06-30 (×4): qty 1

## 2024-06-30 MED ORDER — ARFORMOTEROL TARTRATE 15 MCG/2ML IN NEBU
15.0000 ug | INHALATION_SOLUTION | Freq: Two times a day (BID) | RESPIRATORY_TRACT | Status: DC
  Administered 2024-06-30 – 2024-07-02 (×4): 15 ug via RESPIRATORY_TRACT
  Filled 2024-06-30 (×4): qty 2

## 2024-06-30 MED ORDER — ENOXAPARIN SODIUM 100 MG/ML IJ SOSY
1.0000 mg/kg | PREFILLED_SYRINGE | Freq: Once | INTRAMUSCULAR | Status: AC
  Administered 2024-06-30: 85 mg via SUBCUTANEOUS
  Filled 2024-06-30: qty 0.85

## 2024-06-30 MED ORDER — ATORVASTATIN CALCIUM 40 MG PO TABS
40.0000 mg | ORAL_TABLET | Freq: Every day | ORAL | Status: DC
  Administered 2024-07-01 – 2024-07-02 (×2): 40 mg via ORAL
  Filled 2024-06-30 (×2): qty 1

## 2024-06-30 MED ORDER — SODIUM CHLORIDE 0.9 % IV SOLN: INTRAVENOUS | Status: AC

## 2024-06-30 MED ORDER — MELATONIN 5 MG PO TABS: 5.0000 mg | ORAL_TABLET | Freq: Every evening | ORAL | Status: DC | PRN

## 2024-06-30 MED ORDER — ACETAMINOPHEN 325 MG PO TABS
650.0000 mg | ORAL_TABLET | Freq: Once | ORAL | Status: AC
  Administered 2024-06-30: 650 mg via ORAL
  Filled 2024-06-30: qty 2

## 2024-06-30 MED ORDER — ACETAMINOPHEN 325 MG PO TABS
650.0000 mg | ORAL_TABLET | Freq: Four times a day (QID) | ORAL | Status: DC | PRN
  Administered 2024-07-02: 650 mg via ORAL
  Filled 2024-06-30: qty 2

## 2024-06-30 MED ORDER — ADULT MULTIVITAMIN W/MINERALS CH
1.0000 | ORAL_TABLET | Freq: Every day | ORAL | Status: DC
  Administered 2024-06-30 – 2024-07-02 (×3): 1 via ORAL
  Filled 2024-06-30 (×3): qty 1

## 2024-06-30 MED ORDER — ENOXAPARIN SODIUM 100 MG/ML IJ SOSY
1.0000 mg/kg | PREFILLED_SYRINGE | Freq: Two times a day (BID) | INTRAMUSCULAR | Status: DC
  Administered 2024-07-01 – 2024-07-02 (×2): 85 mg via SUBCUTANEOUS
  Filled 2024-06-30 (×4): qty 0.85

## 2024-06-30 MED ORDER — IOHEXOL 350 MG/ML SOLN
75.0000 mL | Freq: Once | INTRAVENOUS | Status: AC | PRN
  Administered 2024-06-30: 75 mL via INTRAVENOUS

## 2024-06-30 NOTE — ED Provider Notes (Signed)
 [place] EMERGENCY DEPARTMENT AT [facility] Provider Note   CSN: [id number] Arrival date & time: 06/30/24  1441     Patient presents with: Chest Pain   Todd Estrada is a 63 y.o. male.   63 year old male with past medical history of CVA with significant dysarthria at baseline presenting to the emergency department today with complaints of chest pain.  The patient has been seen in the emergency department multiple times for chest pain and abdominal pain.  Is unclear how long this has been going on.  Obtaining history is very difficult due to the patient's expressive aphasia.  He is able to provide history when asked yes or no questions but is unable to elaborate.   Chest Pain      Prior to Admission medications   Medication Sig Start Date End Date Taking? Authorizing Provider  albuterol  (PROVENTIL ) (2.5 MG/3ML) 0.083% nebulizer solution Take 3 mLs (2.5 mg total) by nebulization every 4 (four) hours as needed for wheezing or shortness of breath. 08/16/22   Lou Claretta HERO, MD  apixaban  (ELIQUIS ) 5 MG TABS tablet Take 1 tablet (5 mg total) by mouth 2 (two) times daily. 04/07/24   Gregary Sharper, MD  atorvastatin  (LIPITOR) 40 MG tablet Take 1 tablet (40 mg total) by mouth daily. 02/06/24 02/05/25  Justus, Michael, MD  chlorpheniramine-HYDROcodone (TUSSIONEX) 10-8 MG/5ML Take 5 mLs by mouth every 12 (twelve) hours as needed for cough. 04/23/24   Tobie Gaines, DO  clindamycin  (CLEOCIN ) 300 MG capsule Take 1 capsule (300 mg total) by mouth 3 (three) times daily. 05/15/24   [name], MD  dicyclomine  (BENTYL ) 20 MG tablet Take 1 tablet (20 mg total) by mouth 2 (two) times daily. 06/08/24   Logan Ubaldo NOVAK, PA-C  feeding supplement (ENSURE ENLIVE / ENSURE PLUS) LIQD Take 237 mLs by mouth 2 (two) times daily between meals. 05/03/24   Arlice Reichert, MD  levETIRAcetam  (KEPPRA ) 500 MG tablet Take 1 tablet (500 mg total) by mouth 2 (two) times daily. 04/18/24   Whitfield Raisin, NP   Multiple Vitamin (MULTIVITAMIN WITH MINERALS) TABS tablet Take 1 tablet by mouth daily.    [provider]  naproxen  (NAPROSYN ) 500 MG tablet Take 1 tablet (500 mg total) by mouth 2 (two) times daily. 06/01/24   Charlyn Sora, MD  pantoprazole  (PROTONIX ) 40 MG tablet TAKE 1 TABLET(40 MG) BY MOUTH DAILY 06/18/24   [name], [name], DO  Tiotropium Bromide-Olodaterol (STIOLTO RESPIMAT ) 2.5-2.5 MCG/ACT AERS NEW PRESCRIPTION REQUEST: STIOLTO 2.5 MCG- INHALE TWO PUFFS BY MOUTH DAILY Patient taking differently: Take 2 puffs by mouth daily at 12 noon. 02/07/24   Gregary Sharper, MD    Allergies: Patient has no known allergies.    Review of Systems  Reason unable to perform ROS: Expressive aphasia.  Cardiovascular:  Positive for chest pain.    Updated Vital Signs BP 108/63   Pulse 76   Temp 98.4 F (36.9 C) (Oral)   Resp 20   Ht 6' 1 (1.854 m)   Wt 85 kg   SpO2 98%   BMI 24.72 kg/m   Physical Exam Vitals and nursing note reviewed.   Gen: NAD Eyes: PERRL, EOMI HEENT: no oropharyngeal swelling Neck: trachea midline Resp: clear to auscultation bilaterally Card: RRR, no murmurs, rubs, or gallops Abd: nontender, nondistended, no guarding or rebound Extremities: no calf tenderness, no edema Vascular: 2+ radial pulses bilaterally, 2+ DP pulses bilaterally Skin: no rashes Psyc: acting appropriately   (all labs ordered are listed,  but only abnormal results are displayed) Labs Reviewed  BASIC METABOLIC PANEL WITH GFR - Abnormal; Notable for the following components:      Result Value   Glucose, Bld 109 (*)    All other components within normal limits  D-DIMER, QUANTITATIVE - Abnormal; Notable for the following components:   D-Dimer, Quant 1.19 (*)    All other components within normal limits  RESP PANEL BY RT-PCR (RSV, FLU A&B, COVID)  [id number]  CBC  LIPASE, BLOOD  HEPATIC FUNCTION PANEL  TROPONIN I (HIGH SENSITIVITY)  TROPONIN I (HIGH SENSITIVITY)    EKG: EKG  Interpretation Date/Time:  Sunday June 30 2024 16:11:50 EDT Ventricular Rate:  75 PR Interval:  164 QRS Duration:  82 QT Interval:  368 QTC Calculation: 410 R Axis:   86  Text Interpretation: Sinus rhythm with occasional Premature ventricular complexes Otherwise normal ECG When compared with ECG of 21-Jun-2024 15:24, PREVIOUS ECG IS PRESENT Confirmed by Ula Barter [phone]) on 06/30/2024 6:04:06 PM  Radiology: CT Angio Chest PE W and/or Wo Contrast Result Date: 06/30/2024 CLINICAL DATA:  Positive D-dimer. EXAM: CT ANGIOGRAPHY CHEST WITH CONTRAST TECHNIQUE: Multidetector CT imaging of the chest was performed using the standard protocol during bolus administration of intravenous contrast. Multiplanar CT image reconstructions and MIPs were obtained to evaluate the vascular anatomy. RADIATION DOSE REDUCTION: This exam was performed according to the departmental dose-optimization program which includes automated exposure control, adjustment of the mA and/or kV according to patient size and/or use of iterative reconstruction technique. CONTRAST:  75mL OMNIPAQUE  IOHEXOL  350 MG/ML SOLN COMPARISON:  CT chest abdomen and pelvis 04/25/2024 FINDINGS: Cardiovascular: There is nonocclusive small thrombus in a segmental branch of the left lower lobe image 6/205. No other pulmonary emboli are seen. Heart and aorta are normal in size. There is no pericardial effusion. There are atherosclerotic calcifications of the aorta. Mediastinum/Nodes: No enlarged mediastinal, hilar, or axillary lymph nodes. Thyroid gland, trachea, and esophagus demonstrate no significant findings. Lungs/Pleura: Moderate emphysema present. Focal spiculated density in the right upper lobe is unchanged from prior. Focal slightly spiculated density in the central left upper lobe is unchanged from prior. Ground-glass opacities peripherally in the left upper lobe and left lower lobe has significantly decreased from prior, but have not completely resolved.  No new lung infiltrates are seen. There are calcified nodules bilaterally. Upper Abdomen: Pneumobilia appears new from prior. Musculoskeletal: No chest wall abnormality. No acute or significant osseous findings. Chronic right fifth rib fracture again seen. At Review of the MIP images confirms the above findings. IMPRESSION: 1. Small nonocclusive segmental pulmonary embolus in the left lower lobe. No right heart strain. 2. Stable spiculated densities in the upper lobes bilaterally. 3. Ground-glass opacities in the left upper lobe and left lower lobe have significantly decreased from prior, but have not completely resolved. 4. Pneumobilia appears new from prior.  Please correlate clinically. Aortic Atherosclerosis (ICD10-I70.0) and Emphysema (ICD10-J43.9). Electronically Signed   By: Greig Pique M.D.   On: 06/30/2024 19:57   DG Chest Port 1 View Result Date: 06/30/2024 CLINICAL DATA:  Chest pain EXAM: PORTABLE CHEST 1 VIEW COMPARISON:  Chest x-ray 06/21/2024.  CT of the chest 04/25/2024. FINDINGS: The heart size and mediastinal contours are within normal limits. Patchy masslike opacities in the left upper lobe and perihilar region and right upper lobe appear stable. Patchy opacities in the right lung base have increased. There is a new small right pleural effusion. The visualized skeletal structures are unremarkable. IMPRESSION: 1. Stable masslike opacities  in the left upper lobe and perihilar region and right upper lobe. 2. New patchy opacities in the right lung base have increased, possibly of infectious. 3. New small right pleural effusion. Electronically Signed   By: Greig Pique M.D.   On: 06/30/2024 16:28     Procedures   Medications Ordered in the ED  enoxaparin  (LOVENOX ) injection 85 mg (has no administration in time range)  hyoscyamine  (LEVSIN  SL) SL tablet 0.25 mg (0.25 mg Sublingual Given 06/30/24 1819)  acetaminophen  (TYLENOL ) tablet 650 mg (650 mg Oral Given 06/30/24 1820)  iohexol   (OMNIPAQUE ) 350 MG/ML injection 75 mL (75 mLs Intravenous Contrast Given 06/30/24 1936)                                    Medical Decision Making 63 year old male with past medical history of CVA with expressive aphasia presenting to the emergency department today with complaints of chest pain.  I will further evaluate patient here with basic labs as well as an EKG, chest x-ray, troponin to evaluate for ACS, pulmonary edema, pulmonary infiltrates, or pneumothorax.  He did have a D-dimer drawn at triage as well to evaluate for pulmonary embolism.  I will give the patient a GI cocktail here in the event this is due to esophagitis or gastritis.  Given Tylenol  as well.  Will obtain LFTs and a lipase to evaluate for pancreatitis or hepatobiliary otology.  I will reevaluate for ultimate disposition.  The patient's troponins here are negative.  CT angiogram does show new pulmonary embolism.  The patient reports that he has been taking his medication as prescribed.  This will be a treatment failure for Eliquis .  The patient is given Lovenox .  Ultrasounds of his lower extremities are ordered.  Calls placed for admission.  Amount and/or Complexity of Data Reviewed Labs: ordered. Radiology: ordered.  Risk OTC drugs. Prescription drug management. Decision regarding hospitalization.        Final diagnoses:  Other pulmonary embolism without acute cor pulmonale, unspecified chronicity [facility])    ED Discharge Orders     None          Ula Prentice SAUNDERS, MD 06/30/24 2026

## 2024-06-30 NOTE — ED Provider Triage Note (Signed)
 Emergency Medicine Provider Triage Evaluation Note  Todd Estrada , a 63 y.o. male  was evaluated in triage.  Pt complains of chest pain. Pt with hx of prior CVA, denies new deficits.   Review of Systems  Positive: Chest pain, cough Negative: Fever, chills  Physical Exam  BP (!) 107/90   Pulse 83   Temp 98.4 F (36.9 C) (Oral)   Resp 18   Ht 6' 1 (1.854 m)   Wt 85 kg   SpO2 94%   BMI 24.72 kg/m  Gen:   Awake, no distress   Resp:  Normal effort, lungs CTAB MSK:   Moves extremities without difficulty Neuro:  CN intact, right hemibody weakness at baseline for the patient  Medical Decision Making  Medically screening exam initiated at 3:38 PM.  Appropriate orders placed.  Todd Estrada was informed that the remainder of the evaluation will be completed by another provider, this initial triage assessment does not replace that evaluation, and the importance of remaining in the ED until their evaluation is complete.  Chest pain workup initiated, pt stable for the lobby.   Todd Agent, MD 06/30/24 [phone]

## 2024-06-30 NOTE — H&P (Signed)
 History and Physical  Todd Estrada FMW:[id number] DOB: [birth year] DOA: 06/30/2024  Referring physician: Dr. Ula, EDP  PCP: [name], [name], DO  Outpatient Specialists: Cardiology. Patient coming from: Home.  Chief Complaint: Chest pain.  HPI: Todd Estrada is a 63 y.o. male with medical history significant for hypertension, hyperlipidemia, chronic diastolic CHF, paroxysmal A-fib on Eliquis , prior CVA with aphasia and right hemiplegia, seizure disorder, PAD, COPD, stage IV lung cancer, who presents to the ER with complaints of chest pain 1 hour prior to presentation.  Worse with taking a deep breath.  Associated with shortness of breath.  Admits to compliance to home medications, including his DOAC.  EMS was activated.  He received 324 mg of aspirin  en route via EMS.  In the ER, afebrile, not tachycardic, not hypoxic.  D-dimer was elevated, subsequently, he had a CT angio chest that revealed small nonocclusive segmental pulmonary embolus in the left lower lobe.  No right heart strain seen on CT scan.  Stable spiculated densities in the upper lobes bilaterally.  Home Eliquis  was held and the patient was started on full dose subcu Lovenox .  EDP requested admission for further management of pulmonary embolism while on DOAC.  Admitted by [facility], hospitalist service.  ED Course: Temperature 98.4.  BP 105 7/90, pulse 83, respiratory 18, O2 saturation 94% on room air.  Lab studies notable for troponin 4.  BMP essentially unremarkable.  CBC is unremarkable.  Review of Systems: Review of systems as noted in the HPI. All other systems reviewed and are negative.   Past Medical History:  Diagnosis Date   Asthma    Atypical chest pain 08/13/2022   Community acquired pneumonia 09/14/2022   COPD (chronic obstructive pulmonary disease) (HCC)    Essential hypertension 08/19/2021   GERD (gastroesophageal reflux disease)    HAP (hospital-acquired pneumonia) 09/16/2022   History of tracheostomy     03/09/22-04/11/22   HLD (hyperlipidemia)    Hypertension    Hypokalemia 08/13/2022   Hypomagnesemia 08/13/2022   Lung cancer (HCC)    PAD (peripheral artery disease) (HCC)    Seizures (HCC) 06/02/2022   Sepsis (HCC) 08/13/2022   Sepsis due to pneumonia (HCC) 04/13/2023   Stroke (HCC) 02/2022   Past Surgical History:  Procedure Laterality Date   BRONCHIAL BIOPSY  05/30/2022   Procedure: BRONCHIAL BIOPSIES;  Surgeon: Shelah Lamar RAMAN, MD;  Location: [facility] ENDOSCOPY;  Service: Pulmonary;;   BRONCHIAL BRUSHINGS  05/30/2022   Procedure: BRONCHIAL BRUSHINGS;  Surgeon: Shelah Lamar RAMAN, MD;  Location: [facility] ENDOSCOPY;  Service: Pulmonary;;   BRONCHIAL NEEDLE ASPIRATION BIOPSY  05/30/2022   Procedure: BRONCHIAL NEEDLE ASPIRATION BIOPSIES;  Surgeon: Shelah Lamar RAMAN, MD;  Location: [facility] ENDOSCOPY;  Service: Pulmonary;;   BRONCHIAL WASHINGS  05/30/2022   Procedure: BRONCHIAL WASHINGS;  Surgeon: Shelah Lamar RAMAN, MD;  Location: [facility] ENDOSCOPY;  Service: Pulmonary;;   CHOLECYSTECTOMY N/A 07/05/2023   Procedure: LAPAROSCOPIC CHOLECYSTECTOMY WITH INTRAOPERATIVE CHOLANGIOGRAM;  Surgeon: Sheldon Standing, MD;  Location: [facility] ORS;  Service: General;  Laterality: N/A;   ERCP N/A 07/06/2023   Procedure: ENDOSCOPIC RETROGRADE CHOLANGIOPANCREATOGRAPHY (ERCP);  Surgeon: Rosalie Kitchens, MD;  Location: [facility] ENDOSCOPY;  Service: Gastroenterology;  Laterality: N/A;   ESOPHAGOGASTRODUODENOSCOPY (EGD) WITH PROPOFOL  N/A 03/11/2022   Procedure: ESOPHAGOGASTRODUODENOSCOPY (EGD) WITH PROPOFOL ;  Surgeon: Sebastian Moles, MD;  Location[facility] ENDOSCOPY;  Service: General;  Laterality: N/A;   FIDUCIAL MARKER PLACEMENT  05/30/2022   Procedure: FIDUCIAL MARKER PLACEMENT;  Surgeon: Shelah Lamar RAMAN, MD;  Location: [facility] ENDOSCOPY;  Service: Pulmonary;;  IR ANGIO INTRA EXTRACRAN SEL COM CAROTID INNOMINATE UNI R MOD SED  03/02/2022   IR CT HEAD LTD  03/02/2022   IR PERCUTANEOUS ART THROMBECTOMY/INFUSION INTRACRANIAL INC DIAG ANGIO  03/02/2022   PEG PLACEMENT N/A  03/11/2022   Procedure: PERCUTANEOUS ENDOSCOPIC GASTROSTOMY (PEG) PLACEMENT;  Surgeon: Sebastian Moles, MD;  Location: [facility] ENDOSCOPY;  Service: General;  Laterality: N/A;   RADIOLOGY WITH ANESTHESIA N/A 03/02/2022   Procedure: IR WITH ANESTHESIA;  Surgeon: Dolphus Carrion, MD;  Location: [facility] OR;  Service: Radiology;  Laterality: N/A;   REMOVAL OF STONES  07/06/2023   Procedure: REMOVAL OF STONES;  Surgeon: Rosalie Kitchens, MD;  Location: [facility] ENDOSCOPY;  Service: Gastroenterology;;   ANNETT  07/06/2023   Procedure: ANNETT;  Surgeon: Rosalie Kitchens, MD;  Location: [facility] ENDOSCOPY;  Service: Gastroenterology;;   VIDEO BRONCHOSCOPY WITH RADIAL ENDOBRONCHIAL ULTRASOUND  05/30/2022   Procedure: VIDEO BRONCHOSCOPY WITH RADIAL ENDOBRONCHIAL ULTRASOUND;  Surgeon: Shelah Lamar RAMAN, MD;  Location: [facility] ENDOSCOPY;  Service: Pulmonary;;    Social History:  reports that he quit smoking about 2 years ago. His smoking use included cigarettes. He has never been exposed to tobacco smoke. He has never used smokeless tobacco. He reports that he does not currently use alcohol. He reports that he does not use drugs.   No Known Allergies  Family History  Problem Relation Age of Onset   Throat cancer Mother    Liver cancer Father    Kidney failure Sister    Cancer - Lung Paternal Uncle       Prior to Admission medications   Medication Sig Start Date End Date Taking? Authorizing Provider  albuterol  (PROVENTIL ) (2.5 MG/3ML) 0.083% nebulizer solution Take 3 mLs (2.5 mg total) by nebulization every 4 (four) hours as needed for wheezing or shortness of breath. 08/16/22  Yes Amponsah, Claretta HERO, MD  apixaban  (ELIQUIS ) 5 MG TABS tablet Take 1 tablet (5 mg total) by mouth 2 (two) times daily. 04/07/24  Yes Gregary Sharper, MD  atorvastatin  (LIPITOR) 40 MG tablet Take 1 tablet (40 mg total) by mouth daily. 02/06/24 02/05/25 Yes Gregary Sharper, MD  levETIRAcetam  (KEPPRA ) 500 MG tablet Take 1 tablet (500 mg total) by mouth 2  (two) times daily. 04/18/24  Yes Whitfield Raisin, NP  Multiple Vitamin (MULTIVITAMIN WITH MINERALS) TABS tablet Take 1 tablet by mouth daily.   Yes [provider]  pantoprazole  (PROTONIX ) 40 MG tablet TAKE 1 TABLET(40 MG) BY MOUTH DAILY 06/18/24  Yes [name], [name], DO  Tiotropium Bromide-Olodaterol (STIOLTO RESPIMAT ) 2.5-2.5 MCG/ACT AERS NEW PRESCRIPTION REQUEST: STIOLTO 2.5 MCG- INHALE TWO PUFFS BY MOUTH DAILY Patient taking differently: Take 2 puffs by mouth daily at 12 noon. 02/07/24  Yes Gregary Sharper, MD  chlorpheniramine-HYDROcodone (TUSSIONEX) 10-8 MG/5ML Take 5 mLs by mouth every 12 (twelve) hours as needed for cough. Patient not taking: Reported on 06/30/2024 04/23/24   Tobie Gaines, DO  clindamycin  (CLEOCIN ) 300 MG capsule Take 1 capsule (300 mg total) by mouth 3 (three) times daily. Patient not taking: Reported on 06/30/2024 05/15/24   [name], MD  dicyclomine  (BENTYL ) 20 MG tablet Take 1 tablet (20 mg total) by mouth 2 (two) times daily. Patient not taking: Reported on 06/30/2024 06/08/24   Logan Martinis B, PA-C  feeding supplement (ENSURE ENLIVE / ENSURE PLUS) LIQD Take 237 mLs by mouth 2 (two) times daily between meals. Patient not taking: Reported on 06/30/2024 05/03/24   Arlice Reichert, MD  naproxen  (NAPROSYN ) 500 MG tablet Take 1 tablet (500 mg total)  by mouth 2 (two) times daily. Patient not taking: Reported on 06/30/2024 06/01/24   Charlyn Sora, MD    Physical Exam: BP 108/63   Pulse 76   Temp 98.4 F (36.9 C) (Oral)   Resp 20   Ht 6' 1 (1.854 m)   Wt 85 kg   SpO2 98%   BMI 24.72 kg/m   General: 63 y.o. year-old male well developed well nourished in no acute distress.  Alert with aphasia.  Answers to yes or no questions. Cardiovascular: Regular rate and rhythm with no rubs or gallops.  No thyromegaly or JVD noted.  Trace lower extremity edema bilaterally. Respiratory: Clear to auscultation with no wheezes or rales. Good inspiratory effort. Abdomen: Soft  nontender nondistended with normal bowel sounds x4 quadrants. Muskuloskeletal: No cyanosis or clubbing noted bilaterally Neuro: CN II-XII intact, strength, sensation, reflexes Skin: No ulcerative lesions noted or rashes Psychiatry: Judgement and insight appear normal. Mood is appropriate for condition and setting          Labs on Admission:  Basic Metabolic Panel: Recent Labs  Lab 06/26/24 1522 06/30/24 1543  NA 143 139  K 3.8 4.6  CL 105 103  CO2 25 27  GLUCOSE 101* 109*  BUN 17 13  CREATININE 0.64 0.84  CALCIUM  9.6 9.3   Liver Function Tests: Recent Labs  Lab 06/26/24 1522  AST 21  ALT 18  ALKPHOS 94  BILITOT 0.9  PROT 7.5  ALBUMIN  4.3   Recent Labs  Lab 06/26/24 1522 06/30/24 1720  LIPASE 30 28   No results for input(s): AMMONIA in the last 168 hours. CBC: Recent Labs  Lab 06/26/24 1522 06/30/24 1543  WBC 7.2 6.3  NEUTROABS 5.4  --   HGB 13.3 13.1  HCT 41.7 40.7  MCV 92.7 91.5  PLT 157 158   Cardiac Enzymes: No results for input(s): CKTOTAL, CKMB, CKMBINDEX, TROPONINI in the last 168 hours.  BNP (last 3 results) Recent Labs    08/19/23 2149 06/04/24 1939  BNP 68.1 33.5    ProBNP (last 3 results) No results for input(s): PROBNP in the last 8760 hours.  CBG: No results for input(s): GLUCAP in the last 168 hours.  Radiological Exams on Admission: CT Angio Chest PE W and/or Wo Contrast Result Date: 06/30/2024 CLINICAL DATA:  Positive D-dimer. EXAM: CT ANGIOGRAPHY CHEST WITH CONTRAST TECHNIQUE: Multidetector CT imaging of the chest was performed using the standard protocol during bolus administration of intravenous contrast. Multiplanar CT image reconstructions and MIPs were obtained to evaluate the vascular anatomy. RADIATION DOSE REDUCTION: This exam was performed according to the departmental dose-optimization program which includes automated exposure control, adjustment of the mA and/or kV according to patient size and/or use of  iterative reconstruction technique. CONTRAST:  75mL OMNIPAQUE  IOHEXOL  350 MG/ML SOLN COMPARISON:  CT chest abdomen and pelvis 04/25/2024 FINDINGS: Cardiovascular: There is nonocclusive small thrombus in a segmental branch of the left lower lobe image 6/205. No other pulmonary emboli are seen. Heart and aorta are normal in size. There is no pericardial effusion. There are atherosclerotic calcifications of the aorta. Mediastinum/Nodes: No enlarged mediastinal, hilar, or axillary lymph nodes. Thyroid gland, trachea, and esophagus demonstrate no significant findings. Lungs/Pleura: Moderate emphysema present. Focal spiculated density in the right upper lobe is unchanged from prior. Focal slightly spiculated density in the central left upper lobe is unchanged from prior. Ground-glass opacities peripherally in the left upper lobe and left lower lobe has significantly decreased from prior, but have not completely resolved.  No new lung infiltrates are seen. There are calcified nodules bilaterally. Upper Abdomen: Pneumobilia appears new from prior. Musculoskeletal: No chest wall abnormality. No acute or significant osseous findings. Chronic right fifth rib fracture again seen. At Review of the MIP images confirms the above findings. IMPRESSION: 1. Small nonocclusive segmental pulmonary embolus in the left lower lobe. No right heart strain. 2. Stable spiculated densities in the upper lobes bilaterally. 3. Ground-glass opacities in the left upper lobe and left lower lobe have significantly decreased from prior, but have not completely resolved. 4. Pneumobilia appears new from prior.  Please correlate clinically. Aortic Atherosclerosis (ICD10-I70.0) and Emphysema (ICD10-J43.9). Electronically Signed   By: Greig Pique M.D.   On: 06/30/2024 19:57   DG Chest Port 1 View Result Date: 06/30/2024 CLINICAL DATA:  Chest pain EXAM: PORTABLE CHEST 1 VIEW COMPARISON:  Chest x-ray 06/21/2024.  CT of the chest 04/25/2024. FINDINGS: The  heart size and mediastinal contours are within normal limits. Patchy masslike opacities in the left upper lobe and perihilar region and right upper lobe appear stable. Patchy opacities in the right lung base have increased. There is a new small right pleural effusion. The visualized skeletal structures are unremarkable. IMPRESSION: 1. Stable masslike opacities in the left upper lobe and perihilar region and right upper lobe. 2. New patchy opacities in the right lung base have increased, possibly of infectious. 3. New small right pleural effusion. Electronically Signed   By: Greig Pique M.D.   On: 06/30/2024 16:28    EKG: I independently viewed the EKG done and my findings are as followed: Normal sinus rhythm rate of 75.  Nonspecific ST changes with QTc 410.  Assessment/Plan Present on Admission:  Acute pulmonary embolism (HCC)  Active Problems:   Acute pulmonary embolism (HCC)  Acute pulmonary embolism while on Eliquis , POA No right heart strain seen on CT scan Hypercoagulable state with stage IV lung cancer Hold off home Eliquis  Continue full dose subcu Lovenox  started in the ER Obtain bilateral lower extremity Doppler ultrasound to rule out DVT Maintain O2 saturation above 92% Monitor on telemetry Outpatient follow-up with hematology  Chest pain in the setting of acute pulmonary embolism Continue the above management. Closely monitor on telemetry  History of CVA with aphasia and right-sided hemiplegia Fall precautions Resume home regimen  Seizure disorder Resume home Keppra  Seizures precautions  COPD Resume home regimen   Time: 75 minutes.   DVT prophylaxis: Full dose subcu Lovenox  twice daily  Code Status: DNR  Family Communication: None at bedside  Disposition Plan: Admitted to telemetry cardiac unit.  Consults called: None.  Admission status: Observation status.   Status is: Observation    Terry LOISE Hurst MD [facility] Pager [phone]  If  7PM-7AM, please contact night-coverage [email or web address] Password [facility]  06/30/2024, 8:49 PM

## 2024-06-30 NOTE — ED Triage Notes (Signed)
 Pt here for CP/ epigastric pain for past hour. Pt can say yes or no only at baseline due to previous stroke, has right sided deficits. EMS gave 324mg  of aspirin . C/O SHOB.

## 2024-07-01 ENCOUNTER — Observation Stay (HOSPITAL_COMMUNITY)

## 2024-07-01 ENCOUNTER — Observation Stay (HOSPITAL_BASED_OUTPATIENT_CLINIC_OR_DEPARTMENT_OTHER)

## 2024-07-01 DIAGNOSIS — I2699 Other pulmonary embolism without acute cor pulmonale: Secondary | ICD-10-CM | POA: Diagnosis not present

## 2024-07-01 DIAGNOSIS — Z86711 Personal history of pulmonary embolism: Secondary | ICD-10-CM

## 2024-07-01 LAB — BASIC METABOLIC PANEL WITH GFR
Anion gap: 8 (ref 5–15)
BUN: 11 mg/dL (ref 8–23)
CO2: 29 mmol/L (ref 22–32)
Calcium: 9.1 mg/dL (ref 8.9–10.3)
Chloride: 103 mmol/L (ref 98–111)
Creatinine, Ser: 0.83 mg/dL (ref 0.61–1.24)
GFR, Estimated: >60 mL/min (ref >60)
Glucose, Bld: 110 mg/dL — ABNORMAL HIGH (ref 70–99)
Potassium: 3.7 mmol/L (ref 3.5–5.1)
Sodium: 140 mmol/L (ref 135–145)

## 2024-07-01 LAB — PHOSPHORUS: Phosphorus: 3.9 mg/dL (ref 2.5–4.6)

## 2024-07-01 LAB — HEPATIC FUNCTION PANEL
ALT: 14 U/L (ref 0–44)
AST: 19 U/L (ref 15–41)
Albumin: 3.5 g/dL (ref 3.5–5.0)
Alkaline Phosphatase: 82 U/L (ref 38–126)
Bilirubin, Direct: <0.1 mg/dL (ref 0.0–0.2)
Total Bilirubin: 0.6 mg/dL (ref 0.0–1.2)
Total Protein: 6.6 g/dL (ref 6.5–8.1)

## 2024-07-01 LAB — CBC
HCT: 39.6 % (ref 39.0–52.0)
Hemoglobin: 12.5 g/dL — ABNORMAL LOW (ref 13.0–17.0)
MCH: 28.7 pg (ref 26.0–34.0)
MCHC: 31.6 g/dL (ref 30.0–36.0)
MCV: 91 fL (ref 80.0–100.0)
Platelets: 153 K/uL (ref 150–400)
RBC: 4.35 MIL/uL (ref 4.22–5.81)
RDW: 13.8 % (ref 11.5–15.5)
WBC: 5.1 K/uL (ref 4.0–10.5)
nRBC: 0 % (ref 0.0–0.2)

## 2024-07-01 LAB — TROPONIN I (HIGH SENSITIVITY): Troponin I (High Sensitivity): 6 ng/L (ref <18)

## 2024-07-01 LAB — MAGNESIUM: Magnesium: 2 mg/dL (ref 1.7–2.4)

## 2024-07-01 NOTE — Progress Notes (Signed)
 BLE venous duplex has been completed.  Preliminary results given to Dr. Dana.   Results can be found under chart review under CV PROC. 07/01/2024 5:25 PM Lorisa Scheid RVT, RDMS

## 2024-07-01 NOTE — Hospital Course (Signed)
 Aphasic Has sscp woerse with pressuire Unclear if nonocclusive pe causing that vs djd Still sob, using nebs ? Needs predinosone

## 2024-07-01 NOTE — ED Notes (Signed)
 CCMD called.

## 2024-07-01 NOTE — Progress Notes (Signed)
 PROGRESS NOTE    Todd Estrada  FMW:[id number]  DOB: [birth year]  DOA: 06/30/2024 PCP: [name], [name], DO Outpatient Specialists:   Hospital course:   63 y.o. male with stage IV lung CA, HFpEF, PAF on Eliquis , prior CVA with aphasia and right hemiplegia, seizure disorder, PAD, COPD, who presents to the ER pleuritic chest pain.  Workup revealed small nonocclusive segmental PE in LLL.  Subjective:  Patient is unable to give me a history due to expressive aphasia.  Objective: Vitals:   07/01/24 1215 07/01/24 1341 07/01/24 1514 07/01/24 1618  BP: (!) 148/85   (!) 141/81  Pulse: 86  84 84  Resp: 20   17  Temp:  99.1 F (37.3 C) 98.9 F (37.2 C) 98.5 F (36.9 C)  TempSrc:  Oral Oral Oral  SpO2: 100%  96% 94%  Weight:      Height:        Intake/Output Summary (Last 24 hours) at 07/01/2024 1731 Last data filed at 07/01/2024 1703 Gross per 24 hour  Intake 360 ml  Output --  Net 360 ml   Filed Weights   06/30/24 1445  Weight: 85 kg     Exam:  General: Elderly gentleman looking much older than stated age lying in bed in no respiratory distress Eyes: sclera anicteric, conjuctiva mild injection bilaterally CVS: S1-S2, regular  Respiratory:  decreased air entry bilaterally secondary to decreased inspiratory effort, rales at bases  GI: NABS, soft, NT  LE: Warm and well-perfused Neuro: Marked expressive aphasia  Data Reviewed:  Basic Metabolic Panel: Recent Labs  Lab 06/26/24 1522 06/30/24 1543 07/01/24 0421  NA 143 139 140  K 3.8 4.6 3.7  CL 105 103 103  CO2 25 27 29   GLUCOSE 101* 109* 110*  BUN 17 13 11   CREATININE 0.64 0.84 0.83  CALCIUM  9.6 9.3 9.1  MG  --   --  2.0  PHOS  --   --  3.9    CBC: Recent Labs  Lab 06/26/24 1522 06/30/24 1543 07/01/24 0421  WBC 7.2 6.3 5.1  NEUTROABS 5.4  --   --   HGB 13.3 13.1 12.5*  HCT 41.7 40.7 39.6  MCV 92.7 91.5 91.0  PLT 157 158 153     Scheduled Meds:  arformoterol   15 mcg Nebulization BID   And    umeclidinium bromide   1 puff Inhalation Daily   atorvastatin   40 mg Oral Daily   enoxaparin  (LOVENOX ) injection  1 mg/kg Subcutaneous Q12H   levETIRAcetam   500 mg Oral BID   multivitamin with minerals  1 tablet Oral Daily   Continuous Infusions:  sodium chloride  50 mL/hr at 06/30/24 2251     Assessment & Plan:   Acute PE while on Eliquis  Acute DVT Duplex Dopplers done today reveal DVT in his LLE (mid and distal FV)   Patient is on full dose Lovenox  Of note patient was supposedly on Eliquis  as an outpatient, unclear if this is due to noncompliance or his Eliquis  failure--could consider curbside hematology  Seizure disorder Continue Keppra   CVA with aphasia and right hemiplegia Continue atorvastatin     DVT prophylaxis: Full dose Lovenox  Code Status: DNR Family Communication: None today     Studies: VAS US  LOWER EXTREMITY VENOUS (DVT) Result Date: 07/01/2024  Lower Venous DVT Study Patient Name:  Todd Estrada  Date of Exam:   07/01/2024 Medical Rec #: [id number]      Accession #:    [id number] Date of Birth: 1961/03/27  Patient Gender: M Patient Age:   63 years Exam Location:  [facility] Procedure:      VAS US  LOWER EXTREMITY VENOUS (DVT) Referring Phys: TERRY HALL --------------------------------------------------------------------------------  Indications: Pulmonary embolism.  Risk Factors: Hx of PE Lung cancer. Anticoagulation: Eliquis  prior to admission (Afib). Comparison Study: Previous RLEV on 02/01/2024 was negative for DVT. No previous                   LLEV exams Performing Technologist: Jody Hill RVT, RDMS  Examination Guidelines: A complete evaluation includes B-mode imaging, spectral Doppler, color Doppler, and power Doppler as needed of all accessible portions of each vessel. Bilateral testing is considered an integral part of a complete examination. Limited examinations for reoccurring indications may be performed as noted. The reflux portion of the exam  is performed with the patient in reverse Trendelenburg.  +---------+---------------+---------+-----------+----------+--------------+ RIGHT    CompressibilityPhasicitySpontaneityPropertiesThrombus Aging +---------+---------------+---------+-----------+----------+--------------+ CFV      Full           Yes      Yes                                 +---------+---------------+---------+-----------+----------+--------------+ SFJ      Full                                                        +---------+---------------+---------+-----------+----------+--------------+ FV Prox  Full           Yes      Yes                                 +---------+---------------+---------+-----------+----------+--------------+ FV Mid   Full           Yes      Yes                                 +---------+---------------+---------+-----------+----------+--------------+ FV DistalFull           Yes      Yes                                 +---------+---------------+---------+-----------+----------+--------------+ PFV      Full                                                        +---------+---------------+---------+-----------+----------+--------------+ POP      Full           Yes      Yes                                 +---------+---------------+---------+-----------+----------+--------------+ PTV      Full                                                        +---------+---------------+---------+-----------+----------+--------------+  PERO     Full                                                        +---------+---------------+---------+-----------+----------+--------------+   +---------+---------------+---------+-----------+----------+-----------------+ LEFT     CompressibilityPhasicitySpontaneityPropertiesThrombus Aging    +---------+---------------+---------+-----------+----------+-----------------+ CFV      Full           Yes      Yes                                     +---------+---------------+---------+-----------+----------+-----------------+ SFJ      Full                                                           +---------+---------------+---------+-----------+----------+-----------------+ FV Prox  Full           Yes      Yes                                    +---------+---------------+---------+-----------+----------+-----------------+ FV Mid   Partial        Yes      Yes                  Age Indeterminate +---------+---------------+---------+-----------+----------+-----------------+ FV DistalPartial        Yes      Yes                  Age Indeterminate +---------+---------------+---------+-----------+----------+-----------------+ PFV      Full                                                           +---------+---------------+---------+-----------+----------+-----------------+ POP      Full           Yes      Yes                                    +---------+---------------+---------+-----------+----------+-----------------+ PTV      Full                                                           +---------+---------------+---------+-----------+----------+-----------------+ PERO     Full                                                           +---------+---------------+---------+-----------+----------+-----------------+    Summary: BILATERAL: -No evidence of  popliteal cyst, bilaterally. RIGHT: - There is no evidence of deep vein thrombosis in the lower extremity.  LEFT: - Findings consistent with age indeterminate deep vein thrombosis involving the left femoral vein.   *See table(s) above for measurements and observations.    Preliminary    CT Angio Chest PE W and/or Wo Contrast Addendum Date: 06/30/2024 ADDENDUM REPORT: 06/30/2024 21:21 ADDENDUM: These results were called by telephone at the time of interpretation on 06/30/2024 at 8 p.m. to provider ANDREW TEE , who verbally acknowledged  these results. Electronically Signed   By: Greig Pique M.D.   On: 06/30/2024 21:21   Result Date: 06/30/2024 CLINICAL DATA:  Positive D-dimer. EXAM: CT ANGIOGRAPHY CHEST WITH CONTRAST TECHNIQUE: Multidetector CT imaging of the chest was performed using the standard protocol during bolus administration of intravenous contrast. Multiplanar CT image reconstructions and MIPs were obtained to evaluate the vascular anatomy. RADIATION DOSE REDUCTION: This exam was performed according to the departmental dose-optimization program which includes automated exposure control, adjustment of the mA and/or kV according to patient size and/or use of iterative reconstruction technique. CONTRAST:  75mL OMNIPAQUE  IOHEXOL  350 MG/ML SOLN COMPARISON:  CT chest abdomen and pelvis 04/25/2024 FINDINGS: Cardiovascular: There is nonocclusive small thrombus in a segmental branch of the left lower lobe image 6/205. No other pulmonary emboli are seen. Heart and aorta are normal in size. There is no pericardial effusion. There are atherosclerotic calcifications of the aorta. Mediastinum/Nodes: No enlarged mediastinal, hilar, or axillary lymph nodes. Thyroid gland, trachea, and esophagus demonstrate no significant findings. Lungs/Pleura: Moderate emphysema present. Focal spiculated density in the right upper lobe is unchanged from prior. Focal slightly spiculated density in the central left upper lobe is unchanged from prior. Ground-glass opacities peripherally in the left upper lobe and left lower lobe has significantly decreased from prior, but have not completely resolved. No new lung infiltrates are seen. There are calcified nodules bilaterally. Upper Abdomen: Pneumobilia appears new from prior. Musculoskeletal: No chest wall abnormality. No acute or significant osseous findings. Chronic right fifth rib fracture again seen. At Review of the MIP images confirms the above findings. IMPRESSION: 1. Small nonocclusive segmental pulmonary  embolus in the left lower lobe. No right heart strain. 2. Stable spiculated densities in the upper lobes bilaterally. 3. Ground-glass opacities in the left upper lobe and left lower lobe have significantly decreased from prior, but have not completely resolved. 4. Pneumobilia appears new from prior.  Please correlate clinically. Aortic Atherosclerosis (ICD10-I70.0) and Emphysema (ICD10-J43.9). Electronically Signed: By: Greig Pique M.D. On: 06/30/2024 19:57   DG Chest Port 1 View Result Date: 06/30/2024 CLINICAL DATA:  Chest pain EXAM: PORTABLE CHEST 1 VIEW COMPARISON:  Chest x-ray 06/21/2024.  CT of the chest 04/25/2024. FINDINGS: The heart size and mediastinal contours are within normal limits. Patchy masslike opacities in the left upper lobe and perihilar region and right upper lobe appear stable. Patchy opacities in the right lung base have increased. There is a new small right pleural effusion. The visualized skeletal structures are unremarkable. IMPRESSION: 1. Stable masslike opacities in the left upper lobe and perihilar region and right upper lobe. 2. New patchy opacities in the right lung base have increased, possibly of infectious. 3. New small right pleural effusion. Electronically Signed   By: Greig Pique M.D.   On: 06/30/2024 16:28    Active Problems:   Acute pulmonary embolism (HCC)     Ivan Vangie Pike, [facility]  If 7PM-7AM, please contact night-coverage [email or web address]   LOS:  0 days

## 2024-07-02 ENCOUNTER — Other Ambulatory Visit (HOSPITAL_COMMUNITY): Payer: Self-pay

## 2024-07-02 ENCOUNTER — Telehealth (HOSPITAL_COMMUNITY): Payer: Self-pay | Admitting: Pharmacy Technician

## 2024-07-02 DIAGNOSIS — I2699 Other pulmonary embolism without acute cor pulmonale: Secondary | ICD-10-CM | POA: Diagnosis not present

## 2024-07-02 MED ORDER — ENOXAPARIN SODIUM 100 MG/ML IJ SOSY
1.0000 mg/kg | PREFILLED_SYRINGE | Freq: Two times a day (BID) | INTRAMUSCULAR | 2 refills | Status: DC
  Filled 2024-07-02: qty 44, 26d supply, fill #0

## 2024-07-02 NOTE — Evaluation (Signed)
 Occupational Therapy Evaluation Patient Details Name: Todd Estrada MRN: [id number] DOB: [birth year] Today's Date: 07/02/2024   History of Present Illness   Pt is a 63 y.o. M presenting to [facility] on 06/30/24 w/ chest pain, admitted w/ PE.  PMH includes PNA, COPD, CHF, CVA with aphasia and Rt hemiplegia, lung SCC stage IV, seizures, PAD on Eliquis , chronic hypotension on midodrine , status post lap chole 7/17 with JP drain placed,     Clinical Impressions Pt agreeable to OT eval on arrival. Pt with difficulty providing report of PLOF as pt with difficulty with expressive communication due to reduced clarity of speech. Pt does report spending most of his time in the bed and reports he is normally able to get to and from the restroom; unsure whether family assists with this. Today, pt needing min-mod A for functional mobility, presenting with mild chest pain, generalized weakness, decreased communication, safety, balance, problem solving. Unsure pt's functional baseline, but pt reports prior CVA with residual R deficits and aphasia at baseline. Recommending short term rehab at this time. If pt family able to care for him at home, may go home with HHOT.      If plan is discharge home, recommend the following:   A little help with walking and/or transfers;A little help with bathing/dressing/bathroom;Assistance with cooking/housework;Assist for transportation;Help with stairs or ramp for entrance     Functional Status Assessment   Patient has had a recent decline in their functional status and demonstrates the ability to make significant improvements in function in a reasonable and predictable amount of time.     Equipment Recommendations   Other (comment) (defer)     Recommendations for Other Services   PT consult     Precautions/Restrictions   Precautions Precautions: Fall     Mobility Bed Mobility Overal bed mobility: Needs Assistance Bed Mobility: Supine to Sit, Sit to  Supine     Supine to sit: Min assist Sit to supine: Min assist   General bed mobility comments: min A up from supine to EOB with assist for sequencing to bring BLE toward EOB. light guidance for BLE back into bed/positioning    Transfers Overall transfer level: Needs assistance   Transfers: Sit to/from Stand Sit to Stand: Mod assist                  Balance Overall balance assessment: Needs assistance Sitting-balance support: No upper extremity supported, Feet supported Sitting balance-Leahy Scale: Good Sitting balance - Comments: statically   Standing balance support: No upper extremity supported, During functional activity Standing balance-Leahy Scale: Poor                             ADL either performed or assessed with clinical judgement   ADL Overall ADL's : Needs assistance/impaired Eating/Feeding: Minimal assistance;Sitting   Grooming: Minimal assistance;Sitting   Upper Body Bathing: Moderate assistance;Sitting   Lower Body Bathing: Maximal assistance   Upper Body Dressing : Moderate assistance;Sitting   Lower Body Dressing: Maximal assistance;Sitting/lateral leans               Functional mobility during ADLs: Moderate assistance (for STS without pt rising to full stand)       Vision Patient Visual Report: No change from baseline Additional Comments: unsure baseline, but pt states no when asked of any changes     Perception Perception: Not tested       Praxis Praxis: Not tested  Pertinent Vitals/Pain Pain Assessment Pain Assessment: Faces Faces Pain Scale: Hurts a little bit Pain Location: chest Pain Descriptors / Indicators: Aching Pain Intervention(s): Limited activity within patient's tolerance, Monitored during session (RN notified)     Extremity/Trunk Assessment Upper Extremity Assessment Upper Extremity Assessment: Generalized weakness;RUE deficits/detail RUE Deficits / Details: prior CVA; pt nods yes when  asked if affected by CVA. pt able to use arm minimally to assist with mobility in bed but pt with very limited functional use of hand otherwise   Lower Extremity Assessment Lower Extremity Assessment: Defer to PT evaluation       Communication Communication Communication: Impaired Factors Affecting Communication: Other (comment);Reduced clarity of speech (hx of CVA)   Cognition Arousal: Alert Behavior During Therapy: Flat affect, WFL for tasks assessed/performed Cognition: No family/caregiver present to determine baseline             OT - Cognition Comments: pt follows one step commands with increased time. difficulty providing history secondary to aphasia                 Following commands: Impaired Following commands impaired: Follows one step commands with increased time     Cueing  General Comments   Cueing Techniques: Verbal cues;Tactile cues  shoes in room with R AFO; pt deferred donning during session.   Exercises     Shoulder Instructions      Home Living Family/patient expects to be discharged to:: Private residence Living Arrangements: Other relatives Available Help at Discharge: Family;Available PRN/intermittently Type of Home: House Home Access: Stairs to enter [employer] of Steps: 1   Home Layout: One level     Bathroom Shower/Tub: Chief Strategy Officer: Standard Bathroom Accessibility: No   Home Equipment: BSC/3in1;Wheelchair - manual;Tub bench   Additional Comments: no family present, pt has expressive aphasia and reports yes when asked if he lives with family PLOF obtained from prior hospitalizations      Prior Functioning/Environment Prior Level of Function : Patient poor historian/Family not available             Mobility Comments: pt with max difficulty verbalizing PLOF; pt reporting he is nornally able to get to restroom, otherwise spends his time in the bed. unsure accuracy of report ADLs  Comments: pt reports assist from family; level of assist uncertain    OT Problem List: Decreased strength;Impaired balance (sitting and/or standing);Decreased activity tolerance;Decreased safety awareness;Decreased coordination;Decreased knowledge of use of DME or AE;Decreased knowledge of precautions;Decreased cognition   OT Treatment/Interventions: Self-care/ADL training;Neuromuscular education;Therapeutic exercise;DME and/or AE instruction;Therapeutic activities;Patient/family education;Visual/perceptual remediation/compensation;Cognitive remediation/compensation;Balance training      OT Goals(Current goals can be found in the care plan section)   Acute Rehab OT Goals Patient Stated Goal: no chest pain OT Goal Formulation: With patient Time For Goal Achievement: 07/16/24 Potential to Achieve Goals: Good   OT Frequency:  Min 2X/week    Co-evaluation              AM-PAC OT 6 Clicks Daily Activity     Outcome Measure Help from another person eating meals?: A Little Help from another person taking care of personal grooming?: A Little Help from another person toileting, which includes using toliet, bedpan, or urinal?: A Lot Help from another person bathing (including washing, rinsing, drying)?: A Lot Help from another person to put on and taking off regular upper body clothing?: A Lot Help from another person to put on and taking off regular lower body clothing?: A Lot  6 Click Score: 14   End of Session Nurse Communication: Mobility status  Activity Tolerance: Patient tolerated treatment well Patient left: in bed;with call bell/phone within reach;with bed alarm set  OT Visit Diagnosis: Unsteadiness on feet (R26.81);Muscle weakness (generalized) (M62.81);Other symptoms and signs involving cognitive function                Time: 1316-1340 OT Time Calculation (min): 24 min Charges:  OT General Charges $OT Visit: 1 Visit OT Evaluation $OT Eval Moderate Complexity: 1  Mod OT Treatments $Therapeutic Activity: 8-22 mins  Elma JONETTA Lebron FREDERICK, OTR/L [facility] Office: [phone]   Elma JONETTA Lebron 07/02/2024, 4:19 PM

## 2024-07-02 NOTE — Discharge Instructions (Signed)
Enoxaparin injection What is this medicine? ENOXAPARIN (ee nox a PA rin) is used after knee, hip, or abdominal surgeries to prevent blood clotting. It is also used to treat existing blood clots in the lungs or in the veins. This medicine may be used for other purposes; ask your health care provider or pharmacist if you have questions. COMMON BRAND NAME(S): Lovenox  What should I tell my health care provider before I take this medicine? They need to know if you have any of these conditions: bleeding disorders, hemorrhage, or hemophilia infection of the heart or heart valves kidney or liver disease previous stroke prosthetic heart valve recent surgery or delivery of a baby ulcer in the stomach or intestine, diverticulitis, or other bowel disease an unusual or allergic reaction to enoxaparin, heparin, pork or pork products, other medicines, foods, dyes, or preservatives pregnant or trying to get pregnant breast-feeding  How should I use this medicine? This medicine is for injection under the skin. It is usually given by a health-care professional. You or a family member may be trained on how to give the injections. If you are to give yourself injections, make sure you understand how to use the syringe, measure the dose if necessary, and give the injection. To avoid bruising, do not rub the site where this medicine has been injected. Do not take your medicine more often than directed. Do not stop taking except on the advice of your doctor or health care professional. Make sure you receive a puncture-resistant container to dispose of the needles and syringes once you have finished with them. Do not reuse these items. Return the container to your doctor or health care professional for proper disposal. Talk to your pediatrician regarding the use of this medicine in children. Special care may be needed. Overdosage: If you think you have taken too much of this medicine contact a poison control center  or emergency room at once. NOTE: This medicine is only for you. Do not share this medicine with others.  See this Video for how to administer Enoxaparin: [email or web address].[email or web address]-self-[email or web address]   What if I miss a dose? If you miss a dose, take it as soon as you can. If it is almost time for your next dose, take only that dose. Do not take double or extra doses.  What may interact with this medicine? aspirin and aspirin-like medicines certain medicines that treat or prevent blood clots dipyridamole NSAIDs, medicines for pain and inflammation, like ibuprofen or naproxen This list may not describe all possible interactions. Give your health care provider a list of all the medicines, herbs, non-prescription drugs, or dietary supplements you use. Also tell them if you smoke, drink alcohol, or use illegal drugs. Some items may interact with your medicine.  What should I watch for while using this medicine? Visit your healthcare professional for regular checks on your progress. You may need blood work done while you are taking this medicine. Your condition will be monitored carefully while you are receiving this medicine. It is important not to miss any appointments. If you are going to need surgery or other procedure, tell your healthcare professional that you are using this medicine. Using this medicine for a long time may weaken your bones and increase the risk of bone fractures. Avoid sports and activities that might cause injury while you are using this medicine. Severe falls or injuries can cause unseen bleeding. Be careful when using sharp tools or knives. Consider using an electric razor. Take special care brushing or   flossing your teeth. Report any injuries, bruising, or red spots on the skin to your healthcare professional. Wear a medical ID bracelet or chain. Carry a card that describes your disease and details of your medicine and dosage times.  What side effects may I  notice from receiving this medicine? Side effects that you should report to your doctor or health care professional as soon as possible: allergic reactions like skin rash, itching or hives, swelling of the face, lips, or tongue bone pain signs and symptoms of bleeding such as bloody or black, tarry stools; red or dark-brown urine; spitting up blood or brown material that looks like coffee grounds; red spots on the skin; unusual bruising or bleeding from the eye, gums, or nose signs and symptoms of a blood clot such as chest pain; shortness of breath; pain, swelling, or warmth in the leg signs and symptoms of a stroke such as changes in vision; confusion; trouble speaking or understanding; severe headaches; sudden numbness or weakness of the face, arm or leg; trouble walking; dizziness; loss of coordination Side effects that usually do not require medical attention (report to your doctor or health care professional if they continue or are bothersome): hair loss pain, redness, or irritation at site where injected This list may not describe all possible side effects. Call your doctor for medical advice about side effects. You may report side effects to FDA at 1-800-FDA-1088.  Where should I keep my medicine? Keep out of the reach of children. Store at room temperature between 15 and 30 degrees C (59 and 86 degrees F). Do not freeze. If your injections have been specially prepared, you may need to store them in the refrigerator. Ask your pharmacist. Throw away any unused medicine after the expiration date.   NOTE: This sheet is a summary. It may not cover all possible information. If you have questions about this medicine, talk to your doctor, pharmacist, or health care provider.  2020 [employer]vier/Gold Standard (2017-11-30 11:25:34)   =============================================  Anticoagulant Injection Instructions Using a Prefilled Syringe    Injectable blood thinners (anticoagulants) are  medicines that help prevent blood clots from developing. Two common injectable anticoagulant medicines that are used are fondaparinux and enoxaparin. Injectable anticoagulant medicines are given with a single-use syringe that already has medicine inside of it (prefilled syringe). You inject the medicine through a needle into the layer of fat and tissue between skin and muscle (subcutaneous) in your belly. Before your first injection, your health care provider will instruct you on how to take your anticoagulant medicine at home. Also read the medication guide or package insert that came with the prefilled syringe. Follow directions from the guide about how to prepare and give the injection.  What are the risks? Generally, self-injection of anticoagulant medicine is safe. However, mild problems can occur, including mild bleeding, itching, or rash at the injection site. Other risks may include: Bleeding and bruising. A low platelet count (thrombocytopenia). An allergic reaction to the medication. Liver damage. A low red blood cell count (anemia).  Supplies needed: To inject the medicine, you will need: Alcohol wipes. A prefilled syringe with needle. A container for syringe disposal. This may be a puncture-proof sharps container or a hard-sided plastic container with a cover, such as an empty laundry detergent bottle.  How to inject anticoagulant medicine Wash your hands with soap and water. If soap and water are not available, use alcohol-based hand sanitizer. Locate the site on your belly where the medicine should be injected.   Avoid the area within 2 inches (5 cm) of your navel (umbilicus). Use an alcohol wipe to clean the site where you will be injecting the needle. Let the site air-dry. Remove the plastic cover from the needle on the syringe. Do not let the needle touch anything. Hold the syringe with one hand and use your other hand to twist the rigid needle guard (covering the needle)  counterclockwise. Pull the needle guard straight off the needle. Discard the needle guard. When using a prefilled syringe, do not push the air bubble out of the syringe before the injection. The air bubble will help you get all of the medicine out of the syringe and into your subcutaneous tissue. Hold the syringe in your writing hand like a pencil. Use your other hand to pinch and hold about an inch (2.5 cm) of skin. Do not directly touch the cleaned part of the skin. Insert the entire needle straight into the fold of skin. The needle should be at a 90-degree angle (perpendicular) to the skin. Push the needle all the way against the skin. After the needle is completely inserted into the skin, release the skin that you are pinching. Continue to hold the syringe with your writing hand. Use the thumb or index finger of your writing hand to push the plunger all the way into the syringe to inject the medicine. Pull the needle straight out of the skin. Press and hold an alcohol wipe over the injection site until the bleeding stops. Do not rub the area. Cover the injection site with a bandage, if needed. Do not recap the needle. If your syringe has a safety system for shielding the needle after injection: Firmly push down on the plunger after you complete the injection. The protective sleeve will automatically cover the needle, and you will hear a click. The click means that the needle is safely covered. Place the syringe and needle in the disposal container.  What else do I need to know? Do not use the syringe or needle more than one time. Change the injection site each time you give yourself a shot. Before an injection, make sure the medicine is a clear and colorless or pale yellow. Do not use your medicine if it is discolored or has particles in it. Let your health care provider know right away. Tell your health care providers, including dentists: That you are taking an anticoagulant, especially if  you are injured or plan to have a procedure. If there is a change in your illness, and any other changes in medicines, supplements, or diet. Take over-the-counter and prescription medicines only as told by your health care provider. Keep your medicine safely stored at room temperature. Keep all follow-up visits as told by your health care provider.  Contact a health care provider if: You develop any rashes on your skin. You have large areas of bruising on your skin. Your condition worsens. You develop a fever. There is blood in your urine. You are bleeding from your gums.  Get help right away if: You develop bleeding problems such as: Vomiting blood or coughing up blood. Dark red blood in your urine. Blood in your stool or your stool has a dark, tarry, or coffee-ground appearance. You have bleeding that does not stop. You develop chest pain or shortness of breath. You develop a severe headache or confusion.  These symptoms may represent a serious problem that is an emergency. Do not wait to see if the symptoms will go away. Get medical   help right away. Call your local emergency services (911 in the [place] Do not drive yourself to the hospital. Summary Injectable blood thinners (anticoagulants) are medicines that help prevent blood clots from developing in the veins. You inject the medicine through a needle into the layer of fat and tissue between skin and muscle (subcutaneous) in your belly. Keep all follow-up visits as told by your health care provider. Follow-up visits include visits for lab tests. This information is not intended to replace advice given to you by your health care provider. Make sure you discuss any questions you have with your health care provider. Document Revised: 01/13/2018 Document Reviewed: 01/13/2018 Elsevier Patient Education  2020 [employer]    

## 2024-07-02 NOTE — Discharge Summary (Signed)
 Physician Discharge Summary  Todd Estrada FMW:[id number] DOB: August 25, 1961 DOA: 06/30/2024  PCP: [name], [name], DO  Admit date: 06/30/2024 Discharge date: 07/02/2024  Admitted From: Home Disposition: Home  Recommendations for Outpatient Follow-up:  Follow up with PCP in 1-2 weeks Please obtain BMP/CBC in one week Continue injection blood thinners without interruption.  Follow-up with cancer doctor.  Home Health: N/A Equipment/Devices: N/A  Discharge Condition: Fair CODE STATUS: DNR with limited intervention Diet recommendation: Regular diet, nutritional supplements  Discharge summary: 63 year old with stage IV lung cancer, heart failure with preserved ejection fraction, paroxysmal A-fib on Eliquis , prior stroke with aphasia and right hemiplegia, seizure disorder, peripheral artery disease, COPD presents to the ER with 1 day of pleuritic chest pain.  Further workup revealed that he has a small nonocclusive segmental PE in the left lower lobe along with age-indeterminate DVT of the left femoral vein.  Patient was started on Lovenox .  He is currently stable and without any chest pain or pleuritic symptoms.  Reported consistently taking Eliquis  with the help of family members.  Nonocclusive segmental PE left lower lobe Left leg DVT Stage IV lung cancer currently not on treatment, thromboembolism likely secondary to metastatic cancer despite being on therapeutic Eliquis .  Plan: Hemodynamically stable.  Impaired mobility.  High risk of further thromboembolism.  Eliquis  discontinued.  Started on therapeutic Lovenox  1 mg/kg twice daily.  Patient and caretaker to learn about Lovenox  injection techniques.  Going home with injections. Declined participation with physical and occupational therapies. Resume COPD treatment including inhalers.  Resume Keppra .   Discharge Diagnoses:  Active Problems:   Acute pulmonary embolism [facility])    Discharge Instructions  Discharge Instructions     Diet  general   Complete by: As directed    Increase activity slowly   Complete by: As directed       Allergies as of 07/02/2024   No Known Allergies      Medication List     STOP taking these medications    apixaban  5 MG Tabs tablet Commonly known as: Eliquis        TAKE these medications    albuterol  (2.5 MG/3ML) 0.083% nebulizer solution Commonly known as: PROVENTIL  Take 3 mLs (2.5 mg total) by nebulization every 4 (four) hours as needed for wheezing or shortness of breath.   atorvastatin  40 MG tablet Commonly known as: Lipitor Take 1 tablet (40 mg total) by mouth daily.   enoxaparin  100 MG/ML injection Commonly known as: LOVENOX  Inject 0.85 mLs (85 mg total) into the skin every 12 (twelve) hours.   levETIRAcetam  500 MG tablet Commonly known as: KEPPRA  Take 1 tablet (500 mg total) by mouth 2 (two) times daily.   multivitamin with minerals Tabs tablet Take 1 tablet by mouth daily.   pantoprazole  40 MG tablet Commonly known as: PROTONIX  TAKE 1 TABLET(40 MG) BY MOUTH DAILY   Stiolto Respimat  2.5-2.5 MCG/ACT Aers Generic drug: Tiotropium Bromide-Olodaterol NEW PRESCRIPTION REQUEST: STIOLTO 2.5 MCG- INHALE TWO PUFFS BY MOUTH DAILY What changed:  how much to take how to take this when to take this additional instructions        No Known Allergies  Consultations: None   Procedures/Studies: VAS US  LOWER EXTREMITY VENOUS (DVT) Result Date: 07/01/2024  Lower Venous DVT Study Patient Name:  Todd Estrada  Date of Exam:   07/01/2024 Medical Rec #: [id number]      Accession #:    [id number] Date of Birth: 09-24-1961      Patient Gender: M Patient  Age:   63 years Exam Location:  [facility] Procedure:      VAS US  LOWER EXTREMITY VENOUS (DVT) Referring Phys: TERRY HALL --------------------------------------------------------------------------------  Indications: Pulmonary embolism.  Risk Factors: Hx of PE Lung cancer. Anticoagulation: Eliquis  prior to admission  (Afib). Comparison Study: Previous RLEV on 02/01/2024 was negative for DVT. No previous                   LLEV exams Performing Technologist: Todd Estrada RVT, RDMS  Examination Guidelines: A complete evaluation includes B-mode imaging, spectral Doppler, color Doppler, and power Doppler as needed of all accessible portions of each vessel. Bilateral testing is considered an integral part of a complete examination. Limited examinations for reoccurring indications may be performed as noted. The reflux portion of the exam is performed with the patient in reverse Trendelenburg.  +---------+---------------+---------+-----------+----------+--------------+ RIGHT    CompressibilityPhasicitySpontaneityPropertiesThrombus Aging +---------+---------------+---------+-----------+----------+--------------+ CFV      Full           Yes      Yes                                 +---------+---------------+---------+-----------+----------+--------------+ SFJ      Full                                                        +---------+---------------+---------+-----------+----------+--------------+ FV Prox  Full           Yes      Yes                                 +---------+---------------+---------+-----------+----------+--------------+ FV Mid   Full           Yes      Yes                                 +---------+---------------+---------+-----------+----------+--------------+ FV DistalFull           Yes      Yes                                 +---------+---------------+---------+-----------+----------+--------------+ PFV      Full                                                        +---------+---------------+---------+-----------+----------+--------------+ POP      Full           Yes      Yes                                 +---------+---------------+---------+-----------+----------+--------------+ PTV      Full                                                         +---------+---------------+---------+-----------+----------+--------------+  PERO     Full                                                        +---------+---------------+---------+-----------+----------+--------------+   +---------+---------------+---------+-----------+----------+-----------------+ LEFT     CompressibilityPhasicitySpontaneityPropertiesThrombus Aging    +---------+---------------+---------+-----------+----------+-----------------+ CFV      Full           Yes      Yes                                    +---------+---------------+---------+-----------+----------+-----------------+ SFJ      Full                                                           +---------+---------------+---------+-----------+----------+-----------------+ FV Prox  Full           Yes      Yes                                    +---------+---------------+---------+-----------+----------+-----------------+ FV Mid   Partial        Yes      Yes                  Age Indeterminate +---------+---------------+---------+-----------+----------+-----------------+ FV DistalPartial        Yes      Yes                  Age Indeterminate +---------+---------------+---------+-----------+----------+-----------------+ PFV      Full                                                           +---------+---------------+---------+-----------+----------+-----------------+ POP      Full           Yes      Yes                                    +---------+---------------+---------+-----------+----------+-----------------+ PTV      Full                                                           +---------+---------------+---------+-----------+----------+-----------------+ PERO     Full                                                           +---------+---------------+---------+-----------+----------+-----------------+     Summary: BILATERAL: -No evidence of  popliteal cyst,  bilaterally. RIGHT: - There is no evidence of deep vein thrombosis in the lower extremity.  LEFT: - Findings consistent with age indeterminate deep vein thrombosis involving the left femoral vein.   *See table(s) above for measurements and observations. Electronically signed by Fonda Rim on 07/01/2024 at 6:52:24 PM.    Final    CT Angio Chest PE W and/or Wo Contrast Addendum Date: 06/30/2024 ADDENDUM REPORT: 06/30/2024 21:21 ADDENDUM: These results were called by telephone at the time of interpretation on 06/30/2024 at 8 p.m. to provider ANDREW TEE , who verbally acknowledged these results. Electronically Signed   By: Greig Pique M.D.   On: 06/30/2024 21:21   Result Date: 06/30/2024 CLINICAL DATA:  Positive D-dimer. EXAM: CT ANGIOGRAPHY CHEST WITH CONTRAST TECHNIQUE: Multidetector CT imaging of the chest was performed using the standard protocol during bolus administration of intravenous contrast. Multiplanar CT image reconstructions and MIPs were obtained to evaluate the vascular anatomy. RADIATION DOSE REDUCTION: This exam was performed according to the departmental dose-optimization program which includes automated exposure control, adjustment of the mA and/or kV according to patient size and/or use of iterative reconstruction technique. CONTRAST:  75mL OMNIPAQUE  IOHEXOL  350 MG/ML SOLN COMPARISON:  CT chest abdomen and pelvis 04/25/2024 FINDINGS: Cardiovascular: There is nonocclusive small thrombus in a segmental branch of the left lower lobe image 6/205. No other pulmonary emboli are seen. Heart and aorta are normal in size. There is no pericardial effusion. There are atherosclerotic calcifications of the aorta. Mediastinum/Nodes: No enlarged mediastinal, hilar, or axillary lymph nodes. Thyroid gland, trachea, and esophagus demonstrate no significant findings. Lungs/Pleura: Moderate emphysema present. Focal spiculated density in the right upper lobe is unchanged from prior. Focal slightly spiculated  density in the central left upper lobe is unchanged from prior. Ground-glass opacities peripherally in the left upper lobe and left lower lobe has significantly decreased from prior, but have not completely resolved. No new lung infiltrates are seen. There are calcified nodules bilaterally. Upper Abdomen: Pneumobilia appears new from prior. Musculoskeletal: No chest wall abnormality. No acute or significant osseous findings. Chronic right fifth rib fracture again seen. At Review of the MIP images confirms the above findings. IMPRESSION: 1. Small nonocclusive segmental pulmonary embolus in the left lower lobe. No right heart strain. 2. Stable spiculated densities in the upper lobes bilaterally. 3. Ground-glass opacities in the left upper lobe and left lower lobe have significantly decreased from prior, but have not completely resolved. 4. Pneumobilia appears new from prior.  Please correlate clinically. Aortic Atherosclerosis (ICD10-I70.0) and Emphysema (ICD10-J43.9). Electronically Signed: By: Greig Pique M.D. On: 06/30/2024 19:57   DG Chest Port 1 View Result Date: 06/30/2024 CLINICAL DATA:  Chest pain EXAM: PORTABLE CHEST 1 VIEW COMPARISON:  Chest x-ray 06/21/2024.  CT of the chest 04/25/2024. FINDINGS: The heart size and mediastinal contours are within normal limits. Patchy masslike opacities in the left upper lobe and perihilar region and right upper lobe appear stable. Patchy opacities in the right lung base have increased. There is a new small right pleural effusion. The visualized skeletal structures are unremarkable. IMPRESSION: 1. Stable masslike opacities in the left upper lobe and perihilar region and right upper lobe. 2. New patchy opacities in the right lung base have increased, possibly of infectious. 3. New small right pleural effusion. Electronically Signed   By: Greig Pique M.D.   On: 06/30/2024 16:28   DG Chest 2 View Result Date: 06/21/2024 CLINICAL DATA:  Chest pain EXAM: CHEST - 2 VIEW  COMPARISON:  06/04/2024 FINDINGS: Areas of perihilar and left upper lobe scarring again noted, unchanged. Peripheral opacity surrounding the fiducial marker in the lateral right upper lobe also not significantly changed. No acute infiltrate or effusion. Heart and mediastinal contours are within normal limits. IMPRESSION: Stable post treatment scarring in the lungs bilaterally. No definite acute process. Electronically Signed   By: Franky Crease M.D.   On: 06/21/2024 15:14   CT Renal Stone Study Result Date: 06/15/2024 CLINICAL DATA:  Abdominal/flank pain, stone suspected EXAM: CT ABDOMEN AND PELVIS WITHOUT CONTRAST TECHNIQUE: Multidetector CT imaging of the abdomen and pelvis was performed following the standard protocol without IV contrast. RADIATION DOSE REDUCTION: This exam was performed according to the departmental dose-optimization program which includes automated exposure control, adjustment of the mA and/or kV according to patient size and/or use of iterative reconstruction technique. COMPARISON:  CT abdomen pelvis 06/08/2024 FINDINGS: Lower chest: Bilateral atelectasis.  Coronary artery calcification. Hepatobiliary: No focal liver abnormality. Status post cholecystectomy. No biliary dilatation. Pancreas: Diffusely atrophic. No focal lesion. Otherwise normal pancreatic contour. No surrounding inflammatory changes. No main pancreatic ductal dilatation. Spleen: Normal in size without focal abnormality. Adrenals/Urinary Tract: No adrenal nodule bilaterally. The majority of calcifications within the kidneys are vascular in origin. Couple punctate right nephrolithiasis. No left nephrolithiasis. No hydronephrosis. No definite contour-deforming renal mass. No ureterolithiasis or hydroureter. The urinary bladder is unremarkable. Stomach/Bowel: Stomach is within normal limits. No evidence of bowel wall thickening or dilatation. Appendix appears normal. Vascular/Lymphatic: No abdominal aorta or iliac aneurysm.  Severe atherosclerotic plaque of the aorta and its branches. No abdominal, pelvic, or inguinal lymphadenopathy. Reproductive: Prostate is unremarkable. Other: No intraperitoneal free fluid. No intraperitoneal free gas. No organized fluid collection. Musculoskeletal: A fat containing supraumbilical ventral hernia (2:21). Possible anti mesenteric wall of a short segment of the transverse colon associated with the hernia. No suspicious lytic or blastic osseous lesions. No acute displaced fracture. IMPRESSION: 1. Tiny fat containing Richter hernia containing the anti mesenteric wall of a short segment of transverse colon. 2. Nonobstructive punctate right nephrolithiasis. The remainder of the calcifications within the kidneys are likely vascular in origin. 3.  Aortic Atherosclerosis (ICD10-I70.0). Electronically Signed   By: Morgane  Naveau M.D.   On: 06/15/2024 14:26   DG Forearm Right Result Date: 06/13/2024 CLINICAL DATA:  Right arm pain EXAM: RIGHT FOREARM - 2 VIEW COMPARISON:  January 29, 2023 FINDINGS: Osteopenia.No acute fracture or dislocation. There is no evidence of arthropathy or other focal bone abnormality. Soft tissues are unremarkable. IMPRESSION: No acute fracture or dislocation. Electronically Signed   By: Rogelia Myers M.D.   On: 06/13/2024 15:42   DG Tibia/Fibula Right Result Date: 06/13/2024 CLINICAL DATA:  Bilateral lower leg pain EXAM: RIGHT TIBIA AND FIBULA - 2 VIEW COMPARISON:  November 08, 2023 FINDINGS: Osteopenia.No acute fracture or dislocation. There is no evidence of arthropathy or other focal bone abnormality. Peripheral vascular atherosclerosis. IMPRESSION: Osteopenia.  No acute fracture or dislocation. Electronically Signed   By: Rogelia Myers M.D.   On: 06/13/2024 14:39   DG Tibia/Fibula Left Result Date: 06/13/2024 CLINICAL DATA:  pain EXAM: LEFT TIBIA AND FIBULA - 2 VIEW COMPARISON:  None Available. FINDINGS: Osteopenia.No acute fracture or dislocation. There is no  evidence of arthropathy or other focal bone abnormality. Soft tissues are unremarkable. IMPRESSION: No acute fracture or dislocation. Electronically Signed   By: Rogelia Myers M.D.   On: 06/13/2024 14:38   DG Wrist Complete Right Result Date: 06/13/2024 CLINICAL DATA:  Right arm pain EXAM: RIGHT WRIST - COMPLETE 3+ VIEW COMPARISON:  January 29, 2023 FINDINGS: Diffuse osteopenia.No acute fracture or dislocation. There is no evidence of arthropathy or other focal bone abnormality. Soft tissues are unremarkable. IMPRESSION: Diffuse osteopenia.  No acute fracture or dislocation. Electronically Signed   By: Rogelia Myers M.D.   On: 06/13/2024 14:36   CT Head Wo Contrast Result Date: 06/13/2024 CLINICAL DATA:  Provided history: Head trauma, coagulopathy. Neck trauma, midline tenderness. EXAM: CT HEAD WITHOUT CONTRAST CT CERVICAL SPINE WITHOUT CONTRAST TECHNIQUE: Multidetector CT imaging of the head and cervical spine was performed following the standard protocol without intravenous contrast. Multiplanar CT image reconstructions of the cervical spine were also generated. RADIATION DOSE REDUCTION: This exam was performed according to the departmental dose-optimization program which includes automated exposure control, adjustment of the mA and/or kV according to patient size and/or use of iterative reconstruction technique. COMPARISON:  Head CT 11/19/2023.  Cervical spine CT 11/19/2023. FINDINGS: CT HEAD FINDINGS Brain: Generalized cerebral atrophy. Known extensive chronic cortical/subcortical left MCA territory infarcts (with associated dystrophic calcification). Mild ill-defined hypoattenuation elsewhere within the cerebral white matter, nonspecific but compatible with chronic small vessel disease Patchy chronic infarcts within the left cerebellar hemisphere, unchanged. There is no acute intracranial hemorrhage. No demarcated cortical infarct. No extra-axial fluid collection. No evidence of an intracranial mass.  No midline shift. Vascular: No hyperdense vessel atherosclerotic calcifications. Skull: No calvarial fracture or aggressive osseous lesion. Sinuses/Orbits: No mass or acute finding within the imaged orbits. Chronically opacified right maxillary sinus. The right maxillary sinus is asymmetrically small, which may be developmental or may reflect sinus atelectasis. Mild mucosal thickening within the left maxillary sinus. Moderate left ethmoid sinusitis. Mild-to-moderate mucosal thickening within the left frontal sinus. CT CERVICAL SPINE FINDINGS Alignment: No significant spondylolisthesis. Skull base and vertebrae: The basion-dental and atlanto-dental intervals are maintained.No evidence of acute fracture to the cervical spine. Soft tissues and spinal canal: No prevertebral fluid or swelling. No visible canal hematoma. Disc levels: Cervical spondylosis with multilevel disc space narrowing, disc bulges/central disc protrusions, uncovertebral hypertrophy and facet arthropathy. Disc space narrowing is greatest at C6-C7 (moderate-to-advanced at this level). Multilevel spinal canal narrowing. Most notably at C6-C7, a posterior disc osteophyte complex contributes to at least moderate spinal canal stenosis. Multilevel bony neural foraminal narrowing. Mild multilevel ossification of the posterior longitudinal ligament. Multilevel ventral osteophytes, most prominent at C6-C7. Degenerative changes also present at the C1-C2 articulation. Upper chest: No consolidation within the imaged lung apices. No visible pneumothorax. Emphysema. IMPRESSION: CT head: 1.  No evidence of an acute intracranial abnormality. 2. Chronic left cerebral and left cerebellar infarcts, unchanged. 3. Background parenchymal atrophy and chronic small vessel ischemic disease. 4. Paranasal sinus disease as described. Most notably, there is chronic, severe right maxillary sinusitis (with possible sinus atelectasis). CT cervical spine: 1. No evidence of an acute  cervical spine fracture. 2. 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 At C6-C7, a posterior disc osteophyte complex contributes to at least moderate spinal canal stenosis. 3. Emphysema (ICD10-J43.9). Electronically Signed   By: Rockey Childs D.O.   On: 06/13/2024 14:19   CT Cervical Spine Wo Contrast Result Date: 06/13/2024 CLINICAL DATA:  Provided history: Head trauma, coagulopathy. Neck trauma, midline tenderness. EXAM: CT HEAD WITHOUT CONTRAST CT CERVICAL SPINE WITHOUT CONTRAST TECHNIQUE: Multidetector CT imaging of the head and cervical spine was performed following the standard protocol without intravenous contrast. Multiplanar CT image reconstructions  of the cervical spine were also generated. RADIATION DOSE REDUCTION: This exam was performed according to the departmental dose-optimization program which includes automated exposure control, adjustment of the mA and/or kV according to patient size and/or use of iterative reconstruction technique. COMPARISON:  Head CT 11/19/2023.  Cervical spine CT 11/19/2023. FINDINGS: CT HEAD FINDINGS Brain: Generalized cerebral atrophy. Known extensive chronic cortical/subcortical left MCA territory infarcts (with associated dystrophic calcification). Mild ill-defined hypoattenuation elsewhere within the cerebral white matter, nonspecific but compatible with chronic small vessel disease Patchy chronic infarcts within the left cerebellar hemisphere, unchanged. There is no acute intracranial hemorrhage. No demarcated cortical infarct. No extra-axial fluid collection. No evidence of an intracranial mass. No midline shift. Vascular: No hyperdense vessel atherosclerotic calcifications. Skull: No calvarial fracture or aggressive osseous lesion. Sinuses/Orbits: No mass or acute finding within the imaged orbits. Chronically opacified right maxillary sinus. The right maxillary sinus is asymmetrically  small, which may be developmental or may reflect sinus atelectasis. Mild mucosal thickening within the left maxillary sinus. Moderate left ethmoid sinusitis. Mild-to-moderate mucosal thickening within the left frontal sinus. CT CERVICAL SPINE FINDINGS Alignment: No significant spondylolisthesis. Skull base and vertebrae: The basion-dental and atlanto-dental intervals are maintained.No evidence of acute fracture to the cervical spine. Soft tissues and spinal canal: No prevertebral fluid or swelling. No visible canal hematoma. Disc levels: Cervical spondylosis with multilevel disc space narrowing, disc bulges/central disc protrusions, uncovertebral hypertrophy and facet arthropathy. Disc space narrowing is greatest at C6-C7 (moderate-to-advanced at this level). Multilevel spinal canal narrowing. Most notably at C6-C7, a posterior disc osteophyte complex contributes to at least moderate spinal canal stenosis. Multilevel bony neural foraminal narrowing. Mild multilevel ossification of the posterior longitudinal ligament. Multilevel ventral osteophytes, most prominent at C6-C7. Degenerative changes also present at the C1-C2 articulation. Upper chest: No consolidation within the imaged lung apices. No visible pneumothorax. Emphysema. IMPRESSION: CT head: 1.  No evidence of an acute intracranial abnormality. 2. Chronic left cerebral and left cerebellar infarcts, unchanged. 3. Background parenchymal atrophy and chronic small vessel ischemic disease. 4. Paranasal sinus disease as described. Most notably, there is chronic, severe right maxillary sinusitis (with possible sinus atelectasis). CT cervical spine: 1. No evidence of an acute cervical spine fracture. 2. 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 At C6-C7, a posterior disc osteophyte complex contributes to at least moderate spinal canal stenosis. 3. Emphysema (ICD10-J43.9). Electronically  Signed   By: Rockey Childs D.O.   On: 06/13/2024 14:19   CT ABDOMEN PELVIS W CONTRAST Result Date: 06/08/2024 CLINICAL DATA:  Left lower quadrant abdominal pain EXAM: CT ABDOMEN AND PELVIS WITH CONTRAST TECHNIQUE: Multidetector CT imaging of the abdomen and pelvis was performed using the standard protocol following bolus administration of intravenous contrast. RADIATION DOSE REDUCTION: This exam was performed according to the departmental dose-optimization program which includes automated exposure control, adjustment of the mA and/or kV according to patient size and/or use of iterative reconstruction technique. CONTRAST:  OMNIPAQUE  IOHEXOL  300 MG/ML  SOLN COMPARISON:  None Available. FINDINGS: Lower chest: The visualized lung bases appear hyperinflated in keeping with COPD. Scattered bibasilar ground-glass pulmonary infiltrate appears improved since prior examination in keeping with a resolving infectious or inflammatory process. Cardiac size within normal limits. Hepatobiliary: Mild hepatic steatosis. No focal liver abnormality is seen. Status post cholecystectomy. No biliary dilatation. Pancreas: Unremarkable Spleen: Unremarkable Adrenals/Urinary Tract: Adrenal glands are unremarkable. Kidneys are normal, without renal calculi, focal lesion, or hydronephrosis. Bladder is unremarkable.  Stomach/Bowel: Stomach is within normal limits. Appendix appears normal. No evidence of bowel wall thickening, distention, or inflammatory changes. Vascular/Lymphatic: Extensive aortoiliac atherosclerotic calcification. 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 No pathologic adenopathy within the abdomen and pelvis. Reproductive: Prostate is unremarkable. Other: No abdominal wall hernia or abnormality. No abdominopelvic ascites. Musculoskeletal: No acute  bone abnormality. No lytic or blastic bone lesion. Osseous structures are age appropriate. IMPRESSION: 1. No acute intra-abdominal pathology identified. No evidence of acute diverticulitis. 2. 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 3. Mild hepatic steatosis. 4. COPD. 5. Improving bibasilar ground-glass pulmonary infiltrate in keeping with a resolving infectious or inflammatory process. 6. Peripheral vascular disease Aortic Atherosclerosis (ICD10-I70.0). Electronically Signed   By: Dorethia Molt M.D.   On: 06/08/2024 02:35   DG Chest 2 View Result Date: 06/04/2024 CLINICAL DATA:  chest pain, short of breath for 5 days. EXAM: CHEST - 2 VIEW COMPARISON:  Cxr 06/01/24, CT chest 03/01/2024 FINDINGS: The heart and mediastinal contours are unchanged. Similar-appearing subpleural right upper lobe nodule like density with overlying biopsy marker. Similar-appearing left upper lobe subpleural nodule like perihilar airspace opacity with associated scarring. Chronic coarsened interstitial markings with no overt pulmonary edema. No pleural effusion. No pneumothorax. No acute osseous abnormality. IMPRESSION: 1. No radiographic evidence of acute cardiopulmonary abnormality. 2. Similar-appearing subpleural right upper lobe nodule like density with overlying biopsy marker. Similar-appearing left upper lobe subpleural nodule like perihilar airspace opacity with associated scarring. Electronically Signed   By: Morgane  Naveau M.D.   On: 06/04/2024 19:36   (Echo, Carotid, EGD, Colonoscopy, ERCP)    Subjective: Patient seen and examined.  He is aphasic and very difficult to understand but can interact.  Patient tells me that he feels back to himself.  I called and discussed case with his cousin who is his primary caretaker and they are comfortable taking him home and injecting him with Lovenox .   Discharge Exam: Vitals:    07/02/24 0749 07/02/24 1141  BP: 136/74 125/68  Pulse: 76 87  Resp: 18 19  Temp: 97.8 F (36.6 C) 99.1 F (37.3 C)  SpO2: 94% 93%   Vitals:   07/02/24 0354 07/02/24 0600 07/02/24 0749 07/02/24 1141  BP: 135/66  136/74 125/68  Pulse: 61 81 76 87  Resp: 16 18 18 19   Temp: 97.8 F (36.6 C)  97.8 F (36.6 C) 99.1 F (37.3 C)  TempSrc: Axillary  Oral Oral  SpO2: 98% 93% 94% 93%  Weight:      Height:        General: Pt is alert, awake, not in acute distress Patient has marked aphasia and difficult to understand.  Gross generalized weakness and immobility. Cardiovascular: RRR, S1/S2 +, no rubs, no gallops Respiratory: CTA bilaterally, no wheezing, no rhonchi Abdominal: Soft, NT, ND, bowel sounds + Extremities: no edema, no cyanosis    The results of significant diagnostics from this hospitalization (including imaging, microbiology, ancillary and laboratory) are listed below for reference.     Microbiology: Recent Results (from the past 240 hours)  Resp panel by RT-PCR (RSV, Flu A&B, Covid) Anterior Nasal Swab     Status: None   Collection Time: 06/30/24  3:44 PM   Specimen: Anterior Nasal Swab  Result Value Ref Range Status   SARS Coronavirus 2 by  RT PCR NEGATIVE NEGATIVE Final   Influenza A by PCR NEGATIVE NEGATIVE Final   Influenza B by PCR NEGATIVE NEGATIVE Final    Comment: (NOTE) The Xpert Xpress SARS-CoV-2/FLU/RSV plus assay is intended as an aid in the diagnosis of influenza from Nasopharyngeal swab specimens and should not be used as a sole basis for treatment. Nasal washings and aspirates are unacceptable for Xpert Xpress SARS-CoV-2/FLU/RSV testing.  Fact Sheet for Patients: BloggerCourse.com  Fact Sheet for Healthcare Providers: SeriousBroker.it  This test is not yet approved or cleared by the [place]  FDA and has been authorized for detection and/or diagnosis of SARS-CoV-2 by FDA under an  Emergency Use Authorization (EUA). This EUA will remain in effect (meaning this test can be used) for the duration of the COVID-19 declaration under Section 564(b)(1) of the Act, 21 U.S.C. section 360bbb-3(b)(1), unless the authorization is terminated or revoked.     Resp Syncytial Virus by PCR NEGATIVE NEGATIVE Final    Comment: (NOTE) Fact Sheet for Patients: BloggerCourse.com  Fact Sheet for Healthcare Providers: SeriousBroker.it  This test is not yet approved or cleared by the [place]  FDA and has been authorized for detection and/or diagnosis of SARS-CoV-2 by FDA under an Emergency Use Authorization (EUA). This EUA will remain in effect (meaning this test can be used) for the duration of the COVID-19 declaration under Section 564(b)(1) of the Act, 21 U.S.C. section 360bbb-3(b)(1), unless the authorization is terminated or revoked.  Performed at [facility], [address], [place], [place] [address]      Labs: BNP (last 3 results) Recent Labs    08/19/23 2149 06/04/24 1939  BNP 68.1 33.5   Basic Metabolic Panel: Recent Labs  Lab 06/26/24 1522 06/30/24 1543 07/01/24 0421  NA 143 139 140  K 3.8 4.6 3.7  CL 105 103 103  CO2 25 27 29   GLUCOSE 101* 109* 110*  BUN 17 13 11   CREATININE 0.64 0.84 0.83  CALCIUM  9.6 9.3 9.1  MG  --   --  2.0  PHOS  --   --  3.9   Liver Function Tests: Recent Labs  Lab 06/26/24 1522 07/01/24 0421  AST 21 19  ALT 18 14  ALKPHOS 94 82  BILITOT 0.9 0.6  PROT 7.5 6.6  ALBUMIN  4.3 3.5   Recent Labs  Lab 06/26/24 1522 06/30/24 1720  LIPASE 30 28   No results for input(s): AMMONIA in the last 168 hours. CBC: Recent Labs  Lab 06/26/24 1522 06/30/24 1543 07/01/24 0421  WBC 7.2 6.3 5.1  NEUTROABS 5.4  --   --   HGB 13.3 13.1 12.5*  HCT 41.7 40.7 39.6  MCV 92.7 91.5 91.0  PLT 157 158 153   Cardiac Enzymes: No results for input(s): CKTOTAL, CKMB,  CKMBINDEX, TROPONINI in the last 168 hours. BNP: Invalid input(s): POCBNP CBG: No results for input(s): GLUCAP in the last 168 hours. D-Dimer Recent Labs    06/30/24 1543  DDIMER 1.19*   Hgb A1c No results for input(s): HGBA1C in the last 72 hours. Lipid Profile No results for input(s): CHOL, HDL, LDLCALC, TRIG, CHOLHDL, LDLDIRECT in the last 72 hours. Thyroid function studies No results for input(s): TSH, T4TOTAL, T3FREE, THYROIDAB in the last 72 hours.  Invalid input(s): FREET3 Anemia work up No results for input(s): VITAMINB12, FOLATE, FERRITIN, TIBC, IRON, RETICCTPCT in the last 72 hours. Urinalysis    Component Value Date/Time   COLORURINE YELLOW 06/26/2024 2034   APPEARANCEUR CLEAR 06/26/2024 2034  LABSPEC 1.020 06/26/2024 2034   PHURINE 5.0 06/26/2024 2034   GLUCOSEU NEGATIVE 06/26/2024 2034   HGBUR NEGATIVE 06/26/2024 2034   BILIRUBINUR NEGATIVE 06/26/2024 2034   KETONESUR NEGATIVE 06/26/2024 2034   PROTEINUR NEGATIVE 06/26/2024 2034   NITRITE NEGATIVE 06/26/2024 2034   LEUKOCYTESUR NEGATIVE 06/26/2024 2034   Sepsis Labs Recent Labs  Lab 06/26/24 1522 06/30/24 1543 07/01/24 0421  WBC 7.2 6.3 5.1   Microbiology Recent Results (from the past 240 hours)  Resp panel by RT-PCR (RSV, Flu A&B, Covid) Anterior Nasal Swab     Status: None   Collection Time: 06/30/24  3:44 PM   Specimen: Anterior Nasal Swab  Result Value Ref Range Status   SARS Coronavirus 2 by RT PCR NEGATIVE NEGATIVE Final   Influenza A by PCR NEGATIVE NEGATIVE Final   Influenza B by PCR NEGATIVE NEGATIVE Final    Comment: (NOTE) The Xpert Xpress SARS-CoV-2/FLU/RSV plus assay is intended as an aid in the diagnosis of influenza from Nasopharyngeal swab specimens and should not be used as a sole basis for treatment. Nasal washings and aspirates are unacceptable for Xpert Xpress SARS-CoV-2/FLU/RSV testing.  Fact Sheet for  Patients: BloggerCourse.com  Fact Sheet for Healthcare Providers: SeriousBroker.it  This test is not yet approved or cleared by the [place]  FDA and has been authorized for detection and/or diagnosis of SARS-CoV-2 by FDA under an Emergency Use Authorization (EUA). This EUA will remain in effect (meaning this test can be used) for the duration of the COVID-19 declaration under Section 564(b)(1) of the Act, 21 U.S.C. section 360bbb-3(b)(1), unless the authorization is terminated or revoked.     Resp Syncytial Virus by PCR NEGATIVE NEGATIVE Final    Comment: (NOTE) Fact Sheet for Patients: BloggerCourse.com  Fact Sheet for Healthcare Providers: SeriousBroker.it  This test is not yet approved or cleared by the [place]  FDA and has been authorized for detection and/or diagnosis of SARS-CoV-2 by FDA under an Emergency Use Authorization (EUA). This EUA will remain in effect (meaning this test can be used) for the duration of the COVID-19 declaration under Section 564(b)(1) of the Act, 21 U.S.C. section 360bbb-3(b)(1), unless the authorization is terminated or revoked.  Performed at [facility], [address] [place], [place] [address]      Time coordinating discharge: 35 minutes  SIGNED:   Renato Applebaum, MD  [facility] 07/02/2024, 2:41 PM

## 2024-07-02 NOTE — Progress Notes (Signed)
 PT Cancellation Note  Patient Details Name: Todd Estrada MRN: [id number] DOB: December 06, 1961   Cancelled Treatment:    Reason Eval/Treat Not Completed: Other (comment) (Pt declining to particiate, indicating he does not have any mobility needs and is able to complete mobility tasks at current level of function through yes/no responses. Pt reports he lives w/ a family member who is able to help him if needed.)  Leontine Hilt, SPT Acute Rehab [phone]   Leontine Hilt 07/02/2024, 11:07 AM

## 2024-07-02 NOTE — Progress Notes (Signed)
 Plan of care is reviewed. Pt is alert and oriented x 3, able to follow commands, afebrile, stable hemodynamically, NSR on the monitor, on room, SPO2 92-99%, normal respiratory effort. No SOB or chest pain. Pt is able to sleep well tonight with no major complaints. No acute distress noted. We will continue to monitor.   Wendi Dash, RN

## 2024-07-02 NOTE — Telephone Encounter (Signed)
 Patient Product/process development scientist completed.    The patient is insured through [employer].     Ran test claim for enoxaparin (Lovenox) 100 mg/ml and the current 30 day co-pay is $4.00.   This test claim was processed through [facility]- copay amounts may vary at other pharmacies due to pharmacy/plan contracts, or as the patient moves through the different stages of their insurance plan.     Todd Estrada, CPHT Pharmacy Technician III Certified Patient Advocate [facility] Patient Advocate Team Direct Number: [phone]  Fax: [phone]

## 2024-07-02 NOTE — TOC CM/SW Note (Signed)
 Transition of Care [facility]) - Inpatient Brief Assessment   Patient Details  Name: HOGAN HOOBLER MRN: [id number] Date of Birth: 05/06/1961  Transition of Care [facility]) CM/SW Contact:    Sudie Erminio Deems, RN Phone Number: 07/02/2024, 4:22 PM   Clinical Narrative: Patient presented for chest pain. Patient has support of a cousin. Case Manager has tried several times to call [name] Ronde and no answer to discuss disposition plans. Per MD, patient has limited mobility; however has support of family. Unsure of DME in the home. Plan for discharge today once cousin is educated on Lovenox .    Transition of Care Asessment: Insurance and Status: Insurance coverage has been reviewed Patient has primary care physician: Yes Prior/Current Home Services: No current home services Social Drivers of Health Review: SDOH reviewed no interventions necessary Readmission risk has been reviewed: Yes Transition of care needs: no transition of care needs at this time

## 2024-07-02 NOTE — Progress Notes (Signed)
 Pt has been evaluated by OT. Attempted to call family to obtain history with no answer. If family able to provide assist to pt in home setting, pt may go home with HHOT, otherwise, will need short term rehabilitation. Will continue to follow. Recommending BSC, RW, wheelchair for equipment if pt does not already have these items. Pt unable to confirm.   Todd Estrada, OTR/L [facility] Acute Rehabilitation Office: [phone]

## 2024-07-04 NOTE — Progress Notes (Signed)
 Established Patient Office Visit  Subjective   Patient ID: Todd Estrada, male    DOB: [birth year]  Age: 63 y.o. MRN: [id number]  Chief Complaint  Patient presents with   Hospitalization Follow-up   needs urology referral    [name] is a 63 y.o. male with past medical history of stage IV lung cancer, heart failure with preserved ejection fraction, paroxysmal A-fib on Eliquis , prior stroke with aphasia and right hemiplegia, seizure disorder, peripheral artery disease, COPD admitted on 06/30/2024 for nonocclusive segmental PE in the left lower lobe along with age indeterminate DVT of the left femoral vein.  Review of Systems:  As per assessment and Plan   Objective:     Vitals:   07/05/24 1002  BP: 122/75  Pulse: 74  Temp: 98 F (36.7 C)  TempSrc: Oral  SpO2: 100%  Weight: 187 lb 6.4 oz (85 kg)  Height: 6' 1 (1.854 m)    Physical Exam  General: Sitting in wheelchair, caregiver at patient's side, no acute distress Cardiovascular: Regular rate Pulmonary: Breathing comfortably Abdomen: Soft, nontender, nondistended MSK: No lower extremity edema  Last CBC Lab Results  Component Value Date   WBC 4.8 07/05/2024   HGB 13.5 07/05/2024   HCT 42.2 07/05/2024   MCV 91 07/05/2024   MCH 29.2 07/05/2024   RDW 13.5 07/05/2024   PLT 206 07/05/2024   Last metabolic panel Lab Results  Component Value Date   GLUCOSE 94 07/05/2024   NA 138 07/05/2024   K 4.5 07/05/2024   CL 101 07/05/2024   CO2 22 07/05/2024   BUN 19 07/05/2024   CREATININE 0.82 07/05/2024   EGFR 98 07/05/2024   CALCIUM  9.6 07/05/2024   PHOS 3.9 07/01/2024   PROT 6.6 07/01/2024   ALBUMIN  3.5 07/01/2024   BILITOT 0.6 07/01/2024   ALKPHOS 82 07/01/2024   AST 19 07/01/2024   ALT 14 07/01/2024   ANIONGAP 8 07/01/2024      The ASCVD Risk score (Arnett DK, et al., 2019) failed to calculate for the following reasons:   Risk score cannot be calculated because patient has a medical history  suggesting prior/existing ASCVD    Assessment & Plan:   Patient seen with Dr. Karna  Problem List Items Addressed This Visit       Cardiovascular and Mediastinum   Acute pulmonary embolism [facility])   Patient recently hospitalized on 06/30/2024 for nonocclusive segmental PE in the left lower lobe along with age-indeterminate DVT of the left femoral vein.  Prior to hospitalization, patient was on Eliquis  for paroxysmal A-fib.  Upon discharge, patient was started on Lovenox  0.85 mm twice daily, given by his caregiver.  On exam, patient appears well, without acute distress.  Verbal communication is limited due to prior stroke with aphasia and right hemiplegia.  Patient communicates with yes and no.  Patient did report bilateral lower anterior chest tenderness with palpation, breathing comfortably.  Lung exam clear. 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  Last oncology visit 10/2023; patient is advised to follow back with oncology.  The  etiology of the nonocclusive segmental PE in the left lower lobe was thought to be secondary to the metastatic cancer despite being on therapeutic Eliquis . -Follow-up with oncology        Digestive   Dysphagia, post-stroke   Refill for pantoprazole  was sent to the pharmacy.      Other Visit Diagnoses       Hospital discharge follow-up    -  Primary   Relevant Orders   CBC no Diff (Completed)   Basic metabolic panel with GFR (Completed)       Return in about 3 months (around 10/05/2024) for chronic conditions .    Todd Edwards, DO

## 2024-07-05 ENCOUNTER — Ambulatory Visit: Payer: Self-pay | Admitting: Student

## 2024-07-05 VITALS — BP 122/75 | HR 74 | Temp 98.0°F | Ht 73.0 in | Wt 187.4 lb

## 2024-07-05 DIAGNOSIS — I69391 Dysphagia following cerebral infarction: Secondary | ICD-10-CM | POA: Diagnosis not present

## 2024-07-05 DIAGNOSIS — I2699 Other pulmonary embolism without acute cor pulmonale: Secondary | ICD-10-CM

## 2024-07-05 DIAGNOSIS — Z09 Encounter for follow-up examination after completed treatment for conditions other than malignant neoplasm: Secondary | ICD-10-CM | POA: Diagnosis present

## 2024-07-05 DIAGNOSIS — C3411 Malignant neoplasm of upper lobe, right bronchus or lung: Secondary | ICD-10-CM

## 2024-07-05 DIAGNOSIS — C3490 Malignant neoplasm of unspecified part of unspecified bronchus or lung: Secondary | ICD-10-CM

## 2024-07-05 MED ORDER — PANTOPRAZOLE SODIUM 40 MG PO TBEC: 40.0000 mg | DELAYED_RELEASE_TABLET | Freq: Every day | ORAL | 1 refills | Status: DC

## 2024-07-05 NOTE — Patient Instructions (Signed)
 Thank you, [name] for allowing us  to provide your care today. Today we discussed:  Please make sure you follow-up with your oncologist as soon as possible. Please make sure you give yourself Lovenox  injection 2 times a day. - Please make sure you follow-up with the cardiology and vascular.  I have ordered the following labs for you:   Lab Orders         CBC no Diff         Basic metabolic panel with GFR      Tests ordered today:  Above  Referrals ordered today:   Referral Orders  No referral(s) requested today     I have ordered the following medication/changed the following medications:   Stop the following medications: Medications Discontinued During This Encounter  Medication Reason   pantoprazole  (PROTONIX ) 40 MG tablet Reorder     Start the following medications: Meds ordered this encounter  Medications   pantoprazole  (PROTONIX ) 40 MG tablet    Sig: Take 1 tablet (40 mg total) by mouth daily.    Dispense:  30 tablet    Refill:  1     Follow up: 3 months for chronic conditions     Remember:   Should you have any questions or concerns please call the internal medicine clinic at [phone].     Todd Estrada, D.O. [facility]

## 2024-07-06 ENCOUNTER — Other Ambulatory Visit: Payer: Self-pay | Admitting: Student

## 2024-07-06 LAB — BASIC METABOLIC PANEL WITH GFR
BUN/Creatinine Ratio: 23 (ref 10–24)
BUN: 19 mg/dL (ref 8–27)
CO2: 22 mmol/L (ref 20–29)
Calcium: 9.6 mg/dL (ref 8.6–10.2)
Chloride: 101 mmol/L (ref 96–106)
Creatinine, Ser: 0.82 mg/dL (ref 0.76–1.27)
Glucose: 94 mg/dL (ref 70–99)
Potassium: 4.5 mmol/L (ref 3.5–5.2)
Sodium: 138 mmol/L (ref 134–144)
eGFR: 98 mL/min/1.73 (ref >59)

## 2024-07-06 LAB — CBC
Hematocrit: 42.2 % (ref 37.5–51.0)
Hemoglobin: 13.5 g/dL (ref 13.0–17.7)
MCH: 29.2 pg (ref 26.6–33.0)
MCHC: 32 g/dL (ref 31.5–35.7)
MCV: 91 fL (ref 79–97)
Platelets: 206 x10E3/uL (ref 150–450)
RBC: 4.62 x10E6/uL (ref 4.14–5.80)
RDW: 13.5 % (ref 11.6–15.4)
WBC: 4.8 x10E3/uL (ref 3.4–10.8)

## 2024-07-08 NOTE — Assessment & Plan Note (Signed)
>>  ASSESSMENT AND PLAN FOR HISTORY OF PULMONARY EMBOLUS (PE) WRITTEN ON 07/08/2024  8:47 AM BY HEDDY, ROYA, DO  Patient recently hospitalized on 06/30/2024 for nonocclusive segmental PE in the left lower lobe along with age-indeterminate DVT of the left femoral vein.  Prior to hospitalization, patient was on Eliquis  for paroxysmal A-fib.  Upon discharge, patient was started on Lovenox  0.85 mm twice daily, given by his caregiver.  On exam, patient appears well, without acute distress.  Verbal communication is limited due to prior stroke with aphasia and right hemiplegia.  Patient communicates with yes and no.  Patient did report bilateral lower anterior chest tenderness with palpation, breathing comfortably.  Lung exam clear. -Continue enoxaparin  0.85 mL twice daily, refill sent to pharmacy -Follow-up with cardiology and vascular on 08/22/2024 - Will check labs today for post-hospital F/u

## 2024-07-08 NOTE — Assessment & Plan Note (Signed)
 Synchronous stage Ia (T1c, N0, M0) non-small cell lung cancer, squamous cell carcinoma presented with right upper lobe lung nodule diagnosed in June 2023.  Last oncology visit 10/2023; patient is advised to follow back with oncology.  The etiology of the nonocclusive segmental PE in the left lower lobe was thought to be secondary to the metastatic cancer despite being on therapeutic Eliquis . -Follow-up with oncology

## 2024-07-08 NOTE — Assessment & Plan Note (Signed)
 Refill for pantoprazole  was sent to the pharmacy.

## 2024-07-08 NOTE — Progress Notes (Signed)
 Internal Medicine Clinic Attending  Case discussed with the resident at the time of the visit.  We reviewed the resident's history and exam and pertinent patient test results.  I agree with the assessment, diagnosis, and plan of care documented in the resident's note.

## 2024-07-08 NOTE — Assessment & Plan Note (Addendum)
 Patient recently hospitalized on 06/30/2024 for nonocclusive segmental PE in the left lower lobe along with age-indeterminate DVT of the left femoral vein.  Prior to hospitalization, patient was on Eliquis  for paroxysmal A-fib.  Upon discharge, patient was started on Lovenox  0.85 mm twice daily, given by his caregiver.  On exam, patient appears well, without acute distress.  Verbal communication is limited due to prior stroke with aphasia and right hemiplegia.  Patient communicates with yes and no.  Patient did report bilateral lower anterior chest tenderness with palpation, breathing comfortably.  Lung exam clear. -Continue enoxaparin  0.85 mL twice daily, refill sent to pharmacy -Follow-up with cardiology and vascular on 08/22/2024 - Will check labs today for post-hospital F/u

## 2024-07-09 ENCOUNTER — Ambulatory Visit: Payer: Self-pay | Admitting: Student

## 2024-07-09 ENCOUNTER — Emergency Department (HOSPITAL_COMMUNITY)
Admission: EM | Admit: 2024-07-09 | Discharge: 2024-07-09 | Disposition: A | Discharge status: Home / Self Care | Attending: Emergency Medicine | Admitting: Emergency Medicine

## 2024-07-09 ENCOUNTER — Emergency Department (HOSPITAL_COMMUNITY)

## 2024-07-09 ENCOUNTER — Other Ambulatory Visit: Payer: Self-pay

## 2024-07-09 ENCOUNTER — Encounter (HOSPITAL_COMMUNITY): Payer: Self-pay

## 2024-07-09 DIAGNOSIS — R1013 Epigastric pain: Secondary | ICD-10-CM | POA: Diagnosis present

## 2024-07-09 DIAGNOSIS — R1084 Generalized abdominal pain: Secondary | ICD-10-CM | POA: Insufficient documentation

## 2024-07-09 DIAGNOSIS — Z8673 Personal history of transient ischemic attack (TIA), and cerebral infarction without residual deficits: Secondary | ICD-10-CM | POA: Diagnosis not present

## 2024-07-09 DIAGNOSIS — R0602 Shortness of breath: Secondary | ICD-10-CM | POA: Insufficient documentation

## 2024-07-09 DIAGNOSIS — Z7901 Long term (current) use of anticoagulants: Secondary | ICD-10-CM | POA: Insufficient documentation

## 2024-07-09 LAB — COMPREHENSIVE METABOLIC PANEL WITH GFR
ALT: 57 U/L — ABNORMAL HIGH (ref 0–44)
AST: 48 U/L — ABNORMAL HIGH (ref 15–41)
Albumin: 4.5 g/dL (ref 3.5–5.0)
Alkaline Phosphatase: 96 U/L (ref 38–126)
Anion gap: 12 (ref 5–15)
BUN: 19 mg/dL (ref 8–23)
CO2: 26 mmol/L (ref 22–32)
Calcium: 9.7 mg/dL (ref 8.9–10.3)
Chloride: 103 mmol/L (ref 98–111)
Creatinine, Ser: 0.91 mg/dL (ref 0.61–1.24)
GFR, Estimated: >60 mL/min (ref >60)
Glucose, Bld: 96 mg/dL (ref 70–99)
Potassium: 4.4 mmol/L (ref 3.5–5.1)
Sodium: 141 mmol/L (ref 135–145)
Total Bilirubin: 0.6 mg/dL (ref 0.0–1.2)
Total Protein: 8.2 g/dL — ABNORMAL HIGH (ref 6.5–8.1)

## 2024-07-09 LAB — ACETAMINOPHEN LEVEL: Acetaminophen (Tylenol), Serum: <10 ug/mL — ABNORMAL LOW (ref 10–30)

## 2024-07-09 LAB — CBC WITH DIFFERENTIAL/PLATELET
Abs Immature Granulocytes: 0.02 K/uL (ref 0.00–0.07)
Basophils Absolute: 0 K/uL (ref 0.0–0.1)
Basophils Relative: 0 %
Eosinophils Absolute: 0.2 K/uL (ref 0.0–0.5)
Eosinophils Relative: 2 %
HCT: 45.2 % (ref 39.0–52.0)
Hemoglobin: 13.8 g/dL (ref 13.0–17.0)
Immature Granulocytes: 0 %
Lymphocytes Relative: 15 %
Lymphs Abs: 1.1 K/uL (ref 0.7–4.0)
MCH: 28.8 pg (ref 26.0–34.0)
MCHC: 30.5 g/dL (ref 30.0–36.0)
MCV: 94.4 fL (ref 80.0–100.0)
Monocytes Absolute: 0.7 K/uL (ref 0.1–1.0)
Monocytes Relative: 9 %
Neutro Abs: 5.3 K/uL (ref 1.7–7.7)
Neutrophils Relative %: 74 %
Platelets: 204 K/uL (ref 150–400)
RBC: 4.79 MIL/uL (ref 4.22–5.81)
RDW: 13.9 % (ref 11.5–15.5)
WBC: 7.3 K/uL (ref 4.0–10.5)
nRBC: 0 % (ref 0.0–0.2)

## 2024-07-09 LAB — RAPID URINE DRUG SCREEN, HOSP PERFORMED
Amphetamines: NOT DETECTED
Barbiturates: NOT DETECTED
Benzodiazepines: NOT DETECTED
Cocaine: NOT DETECTED
Opiates: POSITIVE — AB
Tetrahydrocannabinol: NOT DETECTED

## 2024-07-09 LAB — URINALYSIS, W/ REFLEX TO CULTURE (INFECTION SUSPECTED)
Bacteria, UA: NONE SEEN
Bilirubin Urine: NEGATIVE
Glucose, UA: NEGATIVE mg/dL
Hgb urine dipstick: NEGATIVE
Ketones, ur: NEGATIVE mg/dL
Leukocytes,Ua: NEGATIVE
Nitrite: NEGATIVE
Protein, ur: NEGATIVE mg/dL
Specific Gravity, Urine: 1.012 (ref 1.005–1.030)
pH: 6 (ref 5.0–8.0)

## 2024-07-09 LAB — ETHANOL: Alcohol, Ethyl (B): <15 mg/dL (ref <15)

## 2024-07-09 LAB — LIPASE, BLOOD: Lipase: 34 U/L (ref 11–51)

## 2024-07-09 LAB — SALICYLATE LEVEL: Salicylate Lvl: <7 mg/dL — ABNORMAL LOW (ref 7.0–30.0)

## 2024-07-09 LAB — TROPONIN I (HIGH SENSITIVITY): Troponin I (High Sensitivity): 4 ng/L (ref <18)

## 2024-07-09 LAB — D-DIMER, QUANTITATIVE: D-Dimer, Quant: 0.56 ug{FEU}/mL — ABNORMAL HIGH (ref 0.00–0.50)

## 2024-07-09 MED ORDER — IOHEXOL 350 MG/ML SOLN
100.0000 mL | Freq: Once | INTRAVENOUS | Status: AC | PRN
  Administered 2024-07-09: 100 mL via INTRAVENOUS

## 2024-07-09 MED ORDER — SODIUM CHLORIDE 0.9 % IV BOLUS
1000.0000 mL | Freq: Once | INTRAVENOUS | Status: AC
  Administered 2024-07-09: 1000 mL via INTRAVENOUS

## 2024-07-09 MED ORDER — MORPHINE SULFATE (PF) 4 MG/ML IV SOLN
4.0000 mg | Freq: Once | INTRAVENOUS | Status: AC
  Administered 2024-07-09: 4 mg via INTRAVENOUS
  Filled 2024-07-09: qty 1

## 2024-07-09 NOTE — ED Notes (Signed)
Pt aware a urine sample is needed. Urinal at bedside. 

## 2024-07-09 NOTE — ED Notes (Signed)
 Called [name] Ronde, cousin, he is going to come pick patient up in 15 minutes.

## 2024-07-09 NOTE — Discharge Instructions (Signed)
 You have abdominal pain from the Lovenox  injections.  As we discussed, your CT chest did not show any additional pulmonary embolus.  Your pulmonary embolus has resolved and I recommend you continue taking Lovenox   Please follow-up with your doctor  Return to ER if you have worse abdominal pain or vomiting or fever

## 2024-07-09 NOTE — ED Provider Notes (Signed)
 [place] EMERGENCY DEPARTMENT AT [facility] Provider Note   CSN: [id number] Arrival date & time: 07/09/24  1038     Patient presents with: Abdominal Pain   Todd Estrada is a 63 y.o. male history of stroke with expressive aphasia.  Recent PE on Lovenox , here presenting with abdominal pain.  Patient was recently admitted and CT showed PE.  Patient was on Eliquis  and now is currently on Lovenox .  Patient gets Lovenox  injections twice daily.  Patient states that he has been having abdominal pain for several days.  Patient also has persistent shortness of breath.  Patient is difficult to understand and it is difficult to say if he is more altered than usual.  Denies any vomiting or fever   The history is provided by the patient.       Prior to Admission medications   Medication Sig Start Date End Date Taking? Authorizing Provider  albuterol  (PROVENTIL ) (2.5 MG/3ML) 0.083% nebulizer solution Take 3 mLs (2.5 mg total) by nebulization every 4 (four) hours as needed for wheezing or shortness of breath. 08/16/22   Lou Claretta HERO, MD  atorvastatin  (LIPITOR) 40 MG tablet Take 1 tablet (40 mg total) by mouth daily. 02/06/24 02/05/25  Gregary Sharper, MD  enoxaparin  (LOVENOX ) 100 MG/ML injection Inject 0.85 mLs (85 mg total) into the skin every 12 (twelve) hours. 07/02/24   Raenelle Coria, MD  levETIRAcetam  (KEPPRA ) 500 MG tablet Take 1 tablet (500 mg total) by mouth 2 (two) times daily. 04/18/24   Whitfield Raisin, NP  Multiple Vitamin (MULTIVITAMIN WITH MINERALS) TABS tablet Take 1 tablet by mouth daily.    [provider]  pantoprazole  (PROTONIX ) 40 MG tablet TAKE 1 TABLET(40 MG) BY MOUTH DAILY 07/08/24   Tawkaliyar, Roya, DO  Tiotropium Bromide-Olodaterol (STIOLTO RESPIMAT ) 2.5-2.5 MCG/ACT AERS NEW PRESCRIPTION REQUEST: STIOLTO 2.5 MCG- INHALE TWO PUFFS BY MOUTH DAILY Patient taking differently: Take 2 puffs by mouth daily at 12 noon. 02/07/24   Gregary Sharper, MD     Allergies: Patient has no known allergies.    Review of Systems  Respiratory:  Positive for shortness of breath.   Gastrointestinal:  Positive for abdominal pain.  All other systems reviewed and are negative.   Updated Vital Signs BP 106/80   Pulse 79   Temp 97.8 F (36.6 C)   Resp 18   Wt 86 kg   SpO2 100%   BMI 25.01 kg/m   Physical Exam Vitals and nursing note reviewed.  Constitutional:      Comments: Chronically ill with chronic expressive aphasia  HENT:     Head: Normocephalic.     Mouth/Throat:     Mouth: Mucous membranes are moist.  Eyes:     Extraocular Movements: Extraocular movements intact.     Pupils: Pupils are equal, round, and reactive to light.  Cardiovascular:     Rate and Rhythm: Normal rate and regular rhythm.  Pulmonary:     Effort: Pulmonary effort is normal.     Breath sounds: Normal breath sounds.  Abdominal:     General: Abdomen is flat.     Comments: Patient has multiple injection marks from Lovenox  injections.  Mild epigastric tenderness  Skin:    General: Skin is warm.     Capillary Refill: Capillary refill takes less than 2 seconds.  Neurological:     General: No focal deficit present.  Psychiatric:        Mood and Affect: Mood normal.     (all labs  ordered are listed, but only abnormal results are displayed) Labs Reviewed  D-DIMER, QUANTITATIVE - Abnormal; Notable for the following components:      Result Value   D-Dimer, Quant 0.56 (*)    All other components within normal limits  CBC WITH DIFFERENTIAL/PLATELET  COMPREHENSIVE METABOLIC PANEL WITH GFR  URINALYSIS, W/ REFLEX TO CULTURE (INFECTION SUSPECTED)  ETHANOL  SALICYLATE LEVEL  ACETAMINOPHEN  LEVEL  RAPID URINE DRUG SCREEN, HOSP PERFORMED  LIPASE, BLOOD  TROPONIN I (HIGH SENSITIVITY)    EKG: None  Radiology: No results found.   Procedures   Medications Ordered in the ED  sodium chloride  0.9 % bolus 1,000 mL (1,000 mLs Intravenous New Bag/Given 07/09/24  1145)  morphine  (PF) 4 MG/ML injection 4 mg (4 mg Intravenous Given 07/09/24 1142)                                    Medical Decision Making Todd Estrada is a 63 y.o. male here presenting with abdominal pain and shortness of breath.  Patient just had the PE so we will get a CT to rule out a PE.  Will also get CT abdomen pelvis to rule out intra-abdominal process.  Patient is difficult to understand as previous stroke so we will get CT head as well.  2:01 PM Reviewed patient's labs and independently interpreted imaging studies.  D-dimer was ordered in triage slightly elevated.  CT PE did not show any PE.  CT of the abdomen pelvis unremarkable and CT head unremarkable.  I think patient likely has pain from the Lovenox  injection.  He has no intra-abdominal process.  At this point he is stable for discharge home  Problems Addressed: Generalized abdominal pain: acute illness or injury  Amount and/or Complexity of Data Reviewed Labs: ordered. Decision-making details documented in ED Course. Radiology: ordered.  Risk Prescription drug management.     Final diagnoses:  None    ED Discharge Orders     None          [name], MD 07/09/24 [phone]

## 2024-07-09 NOTE — ED Triage Notes (Signed)
 Pt c/o upper ABD pain/ incomprehensible speech at baseline/ family member dropped pt off/ pt possibly getting abd injections for PE

## 2024-07-13 ENCOUNTER — Other Ambulatory Visit: Payer: Self-pay

## 2024-07-13 ENCOUNTER — Emergency Department (HOSPITAL_COMMUNITY)
Admission: EM | Admit: 2024-07-13 | Discharge: 2024-07-13 | Disposition: A | Discharge status: Home / Self Care | Attending: Emergency Medicine | Admitting: Emergency Medicine

## 2024-07-13 ENCOUNTER — Emergency Department (HOSPITAL_COMMUNITY)

## 2024-07-13 DIAGNOSIS — I509 Heart failure, unspecified: Secondary | ICD-10-CM | POA: Diagnosis not present

## 2024-07-13 DIAGNOSIS — R1013 Epigastric pain: Secondary | ICD-10-CM | POA: Insufficient documentation

## 2024-07-13 DIAGNOSIS — R072 Precordial pain: Secondary | ICD-10-CM | POA: Diagnosis present

## 2024-07-13 DIAGNOSIS — J449 Chronic obstructive pulmonary disease, unspecified: Secondary | ICD-10-CM | POA: Diagnosis not present

## 2024-07-13 DIAGNOSIS — Z85118 Personal history of other malignant neoplasm of bronchus and lung: Secondary | ICD-10-CM | POA: Diagnosis not present

## 2024-07-13 DIAGNOSIS — Z7901 Long term (current) use of anticoagulants: Secondary | ICD-10-CM | POA: Diagnosis not present

## 2024-07-13 DIAGNOSIS — R21 Rash and other nonspecific skin eruption: Secondary | ICD-10-CM | POA: Diagnosis not present

## 2024-07-13 DIAGNOSIS — I110 Hypertensive heart disease with heart failure: Secondary | ICD-10-CM | POA: Diagnosis not present

## 2024-07-13 DIAGNOSIS — R7401 Elevation of levels of liver transaminase levels: Secondary | ICD-10-CM | POA: Diagnosis not present

## 2024-07-13 LAB — PROTIME-INR
INR: 1.1 (ref 0.8–1.2)
Prothrombin Time: 14.4 s (ref 11.4–15.2)

## 2024-07-13 LAB — COMPREHENSIVE METABOLIC PANEL WITH GFR
ALT: 105 U/L — ABNORMAL HIGH (ref 0–44)
AST: 66 U/L — ABNORMAL HIGH (ref 15–41)
Albumin: 3.7 g/dL (ref 3.5–5.0)
Alkaline Phosphatase: 89 U/L (ref 38–126)
Anion gap: 8 (ref 5–15)
BUN: 16 mg/dL (ref 8–23)
CO2: 27 mmol/L (ref 22–32)
Calcium: 9.2 mg/dL (ref 8.9–10.3)
Chloride: 107 mmol/L (ref 98–111)
Creatinine, Ser: 0.84 mg/dL (ref 0.61–1.24)
GFR, Estimated: >60 mL/min (ref >60)
Glucose, Bld: 121 mg/dL — ABNORMAL HIGH (ref 70–99)
Potassium: 4 mmol/L (ref 3.5–5.1)
Sodium: 142 mmol/L (ref 135–145)
Total Bilirubin: 0.6 mg/dL (ref 0.0–1.2)
Total Protein: 6.7 g/dL (ref 6.5–8.1)

## 2024-07-13 LAB — CBC
HCT: 38.8 % — ABNORMAL LOW (ref 39.0–52.0)
Hemoglobin: 12.1 g/dL — ABNORMAL LOW (ref 13.0–17.0)
MCH: 28.9 pg (ref 26.0–34.0)
MCHC: 31.2 g/dL (ref 30.0–36.0)
MCV: 92.6 fL (ref 80.0–100.0)
Platelets: 163 K/uL (ref 150–400)
RBC: 4.19 MIL/uL — ABNORMAL LOW (ref 4.22–5.81)
RDW: 14.2 % (ref 11.5–15.5)
WBC: 4.6 K/uL (ref 4.0–10.5)
nRBC: 0 % (ref 0.0–0.2)

## 2024-07-13 LAB — TROPONIN I (HIGH SENSITIVITY): Troponin I (High Sensitivity): 4 ng/L (ref <18)

## 2024-07-13 MED ORDER — IPRATROPIUM-ALBUTEROL 0.5-2.5 (3) MG/3ML IN SOLN
3.0000 mL | Freq: Once | RESPIRATORY_TRACT | Status: AC
  Administered 2024-07-13: 3 mL via RESPIRATORY_TRACT
  Filled 2024-07-13: qty 3

## 2024-07-13 MED ORDER — ALUM & MAG HYDROXIDE-SIMETH 200-200-20 MG/5ML PO SUSP
30.0000 mL | Freq: Once | ORAL | Status: AC
  Administered 2024-07-13: 30 mL via ORAL
  Filled 2024-07-13: qty 30

## 2024-07-13 MED ORDER — SUCRALFATE 1 G PO TABS: 1.0000 g | ORAL_TABLET | Freq: Three times a day (TID) | ORAL | 1 refills | Status: DC

## 2024-07-13 NOTE — Discharge Instructions (Signed)
 No signs of blood clots today, heart problems and no pneumonia.  This pain in the upper belly and send over the chest might be related to acid reflux.  Continue taking your pantoprazole  every day but then you can take the Carafate  up to 4 times a day as needed if you continue to have some pain.  It will be important for you to follow-up with your doctor in approximately 1 week to check your liver.  If you start having fever, vomiting, shortness of breath return to the emergency room.

## 2024-07-13 NOTE — ED Provider Notes (Signed)
 [name] Provider Note   CSN: [id number] Arrival date & time: 07/13/24  1315     Patient presents with: Chest Pain   Todd Estrada is a 63 y.o. male.   Patient is a 63 year old male with a history of stage IV lung cancer, heart failure with preserved ejection fraction, paroxysmal A-fib, prior stroke with aphasia and right hemiplegia, seizure disorder, peripheral artery disease, COPD, hypertension, COPD, GERD, recent PE despite being on Eliquis  so now currently getting Lovenox  injections who is presenting today with a complaint of chest pain.  Due to patient's aphasia it is very hard to understand him but based on yes and no questioning patient has had pain since yesterday he denies feeling short of breath or having a new cough.  When asked where the pain is he points to his sternal region and epigastric region.  He denies vomiting, abdominal pain.  He has been having bowel movements and denies any pain in his legs.  He denies fever or congestion.  He reports compliance with his anticoagulation.  The history is provided by the patient and medical records.  Chest Pain      Prior to Admission medications   Medication Sig Start Date End Date Taking? Authorizing Provider  sucralfate  (CARAFATE ) 1 g tablet Take 1 tablet (1 g total) by mouth 4 (four) times daily -  with meals and at bedtime. 07/13/24  Yes Doretha Folks, MD  albuterol  (PROVENTIL ) (2.5 MG/3ML) 0.083% nebulizer solution Take 3 mLs (2.5 mg total) by nebulization every 4 (four) hours as needed for wheezing or shortness of breath. 08/16/22   Lou Claretta HERO, MD  atorvastatin  (LIPITOR) 40 MG tablet Take 1 tablet (40 mg total) by mouth daily. 02/06/24 02/05/25  Gregary Sharper, MD  enoxaparin  (LOVENOX ) 100 MG/ML injection Inject 0.85 mLs (85 mg total) into the skin every 12 (twelve) hours. 07/02/24   Raenelle Coria, MD  levETIRAcetam  (KEPPRA ) 500 MG tablet Take 1 tablet (500 mg total)  by mouth 2 (two) times daily. 04/18/24   Whitfield Raisin, NP  Multiple Vitamin (MULTIVITAMIN WITH MINERALS) TABS tablet Take 1 tablet by mouth daily.    [provider]  pantoprazole  (PROTONIX ) 40 MG tablet TAKE 1 TABLET(40 MG) BY MOUTH DAILY 07/08/24   Tawkaliyar, Roya, DO  Tiotropium Bromide-Olodaterol (STIOLTO RESPIMAT ) 2.5-2.5 MCG/ACT AERS NEW PRESCRIPTION REQUEST: STIOLTO 2.5 MCG- INHALE TWO PUFFS BY MOUTH DAILY Patient taking differently: Take 2 puffs by mouth daily at 12 noon. 02/07/24   Gregary Sharper, MD    Allergies: Patient has no known allergies.    Review of Systems  Cardiovascular:  Positive for chest pain.    Updated Vital Signs BP 124/79   Pulse 88   Temp 99.6 F (37.6 C) (Oral)   Resp 15   SpO2 94%   Physical Exam Vitals and nursing note reviewed.  Constitutional:      General: He is not in acute distress.    Appearance: He is well-developed.  HENT:     Head: Normocephalic and atraumatic.     Mouth/Throat:     Mouth: Mucous membranes are moist.  Eyes:     Conjunctiva/sclera: Conjunctivae normal.     Pupils: Pupils are equal, round, and reactive to light.  Cardiovascular:     Rate and Rhythm: Normal rate and regular rhythm.     Pulses: Normal pulses.     Heart sounds: No murmur heard. Pulmonary:     Effort: Pulmonary effort is normal.  No respiratory distress.     Breath sounds: Examination of the left-lower field reveals rhonchi. Wheezing and rhonchi present. No rales.  Abdominal:     General: There is no distension.     Palpations: Abdomen is soft.     Tenderness: There is no abdominal tenderness. There is no guarding or rebound.     Comments: Healing ecchymosis over the abdomen  Musculoskeletal:        General: No tenderness. Normal range of motion.     Cervical back: Normal range of motion and neck supple.     Right lower leg: No edema.     Left lower leg: No edema.  Skin:    General: Skin is warm and dry.     Findings: No erythema or rash.   Neurological:     Mental Status: He is alert. Mental status is at baseline.     Comments: Pt is awake and attempting to communicate.  He can answer yes and no questions but then just mumbles otherwise which appears to be baseline.  He is able to move all extremities and sensations appears intact.  Psychiatric:        Mood and Affect: Mood normal.        Behavior: Behavior normal.     (all labs ordered are listed, but only abnormal results are displayed) Labs Reviewed  CBC - Abnormal; Notable for the following components:      Result Value   RBC 4.19 (*)    Hemoglobin 12.1 (*)    HCT 38.8 (*)    All other components within normal limits  COMPREHENSIVE METABOLIC PANEL WITH GFR - Abnormal; Notable for the following components:   Glucose, Bld 121 (*)    AST 66 (*)    ALT 105 (*)    All other components within normal limits  PROTIME-INR  TROPONIN I (HIGH SENSITIVITY)  TROPONIN I (HIGH SENSITIVITY)    EKG: EKG Interpretation Date/Time:  Saturday July 13 2024 13:28:16 EDT Ventricular Rate:  91 PR Interval:  172 QRS Duration:  83 QT Interval:  327 QTC Calculation: 403 R Axis:   68  Text Interpretation: Sinus rhythm Low voltage, precordial leads No significant change since last tracing Confirmed by Doretha Folks ([id number]) on 07/13/2024 2:21:09 PM  Radiology: [facility] Chest Port 1 View Result Date: 07/13/2024 CLINICAL DATA:  Chest and epigastric pain. EXAM: PORTABLE CHEST 1 VIEW COMPARISON:  06/30/2024 FINDINGS: The heart size and mediastinal contours are within normal limits. Pulmonary emphysema again noted. Bilateral pleural-parenchymal scarring shows no significant change. No acute infiltrate or pleural effusion. IMPRESSION: Emphysema and bilateral pleural-parenchymal scarring. No acute findings. Electronically Signed   By: Norleen DELENA Kil M.D.   On: 07/13/2024 13:49     Procedures   Medications Ordered in the ED  alum & mag hydroxide-simeth (MAALOX/MYLANTA) 200-200-20 MG/5ML  suspension 30 mL (has no administration in time range)  ipratropium-albuterol  (DUONEB) 0.5-2.5 (3) MG/3ML nebulizer solution 3 mL (3 mLs Nebulization Given 07/13/24 1445)                                    Medical Decision Making Amount and/or Complexity of Data Reviewed External Data Reviewed: notes. Labs: ordered. Decision-making details documented in ED Course. Radiology: ordered and independent interpretation performed. Decision-making details documented in ED Course. ECG/medicine tests: ordered and independent interpretation performed. Decision-making details documented in ED Course.  Risk OTC drugs. Prescription drug management.  Pt with multiple medical problems and comorbidities and presenting today with a complaint that caries a high risk for morbidity and mortality.  Here today with complaint of chest pain.  Due to the difficult nature and obtaining a history through other medical record evaluation chest pain is concerning for possible pneumonia, COPD exacerbation, chest pain from prior known PE, gastritis, ACS.  Patient is not displaying signs of sepsis at this time.  He appears in no acute distress.  Patient was seen 4 days ago and at that time had a CT of his head, chest and abdomen without acute findings and at that time had no further PEs.  He also had a head CT which was negative.  Patient does have some wheezing on exam with a history of COPD will give a DuoNeb.  3:21 PM I independently interpreted patient's labs and CBC without acute changes, troponin, INR are within normal limits.  CMP with persistent mild elevated LFTs with an AST of 66 and ALT of 105 that will need to continue to be monitored.  I have independently visualized and interpreted pt's images today.  Chest x-ray without acute changes.  Radiology reports emphysema bilateral pleural parenchymal scarring but no acute findings.  On repeat evaluation patient still reports he has pain in now more seems like the  epigastric and substernal area.  Suspect most likely GERD at this time.  He is not having any vomiting or poor tolerance of the eating concerning for a biliary obstruction and total bilirubin is normal.  Patient had a CT 4 days ago that showed no acute abdominal findings and at that time showed some mild CBD dilation which has been stable status postcholecystectomy.  Will have patient continue his pantoprazole  and will add Carafate  as needed.       Final diagnoses:  Epigastric pain    ED Discharge Orders          Ordered    sucralfate  (CARAFATE ) 1 g tablet  3 times daily with meals & bedtime        07/13/24 1520               Doretha Folks, MD 07/13/24 1521

## 2024-07-13 NOTE — ED Triage Notes (Signed)
 Pt arrives with c/o chest pain, epigastric pain

## 2024-07-13 NOTE — ED Notes (Signed)
 Called pt's cousin to notify him of pt's discharge. He states he will come to pick him up

## 2024-07-17 ENCOUNTER — Encounter (HOSPITAL_COMMUNITY): Payer: Self-pay | Admitting: Emergency Medicine

## 2024-07-17 ENCOUNTER — Other Ambulatory Visit: Payer: Self-pay

## 2024-07-17 ENCOUNTER — Emergency Department (HOSPITAL_COMMUNITY)
Admission: EM | Admit: 2024-07-17 | Discharge: 2024-07-18 | Disposition: A | Discharge status: Home / Self Care | Attending: Emergency Medicine | Admitting: Emergency Medicine

## 2024-07-17 ENCOUNTER — Emergency Department (HOSPITAL_COMMUNITY)

## 2024-07-17 DIAGNOSIS — I509 Heart failure, unspecified: Secondary | ICD-10-CM | POA: Insufficient documentation

## 2024-07-17 DIAGNOSIS — M25551 Pain in right hip: Secondary | ICD-10-CM | POA: Insufficient documentation

## 2024-07-17 DIAGNOSIS — R21 Rash and other nonspecific skin eruption: Secondary | ICD-10-CM | POA: Diagnosis present

## 2024-07-17 LAB — CBC WITH DIFFERENTIAL/PLATELET
Abs Immature Granulocytes: 0.01 K/uL (ref 0.00–0.07)
Basophils Absolute: 0 K/uL (ref 0.0–0.1)
Basophils Relative: 0 %
Eosinophils Absolute: 0.1 K/uL (ref 0.0–0.5)
Eosinophils Relative: 3 %
HCT: 43.6 % (ref 39.0–52.0)
Hemoglobin: 13.8 g/dL (ref 13.0–17.0)
Immature Granulocytes: 0 %
Lymphocytes Relative: 20 %
Lymphs Abs: 1 K/uL (ref 0.7–4.0)
MCH: 29.1 pg (ref 26.0–34.0)
MCHC: 31.7 g/dL (ref 30.0–36.0)
MCV: 91.8 fL (ref 80.0–100.0)
Monocytes Absolute: 0.4 K/uL (ref 0.1–1.0)
Monocytes Relative: 8 %
Neutro Abs: 3.4 K/uL (ref 1.7–7.7)
Neutrophils Relative %: 69 %
Platelets: 184 K/uL (ref 150–400)
RBC: 4.75 MIL/uL (ref 4.22–5.81)
RDW: 14.1 % (ref 11.5–15.5)
WBC: 5 K/uL (ref 4.0–10.5)
nRBC: 0 % (ref 0.0–0.2)

## 2024-07-17 LAB — BASIC METABOLIC PANEL WITH GFR
Anion gap: 7 (ref 5–15)
BUN: 17 mg/dL (ref 8–23)
CO2: 26 mmol/L (ref 22–32)
Calcium: 9.3 mg/dL (ref 8.9–10.3)
Chloride: 106 mmol/L (ref 98–111)
Creatinine, Ser: 0.84 mg/dL (ref 0.61–1.24)
GFR, Estimated: >60 mL/min (ref >60)
Glucose, Bld: 94 mg/dL (ref 70–99)
Potassium: 3.9 mmol/L (ref 3.5–5.1)
Sodium: 139 mmol/L (ref 135–145)

## 2024-07-17 NOTE — ED Provider Triage Note (Signed)
 Emergency Medicine Provider Triage Evaluation Note  Todd Estrada , a 63 y.o. male  was evaluated in triage.  Pt complains of rash to left buttock region and R hip pain. History very hard to obtain, patient dropped off by cousin and information received from registration.   Review of Systems  Positive: Rash to left buttock, R hip pain Negative: Fever, chills, chest pain, shortness of breath  Physical Exam  BP 114/80   Pulse 91   Temp 99 F (37.2 C)   Resp 16   Wt 86 kg   SpO2 95%   BMI 25.01 kg/m  Gen:   Awake, no distress   Resp:  Normal effort  MSK:   Moves extremities without difficulty  Other:  Patient very hard to understand and hard to obtain history from, does not appear in acute distress  Medical Decision Making  Medically screening exam initiated at 8:00 PM.  Appropriate orders placed.  Todd Estrada was informed that the remainder of the evaluation will be completed by another provider, this initial triage assessment does not replace that evaluation, and the importance of remaining in the ED until their evaluation is complete.  Orders: CBC, BMP, R hip xray   Todd Estrada, [place] 07/17/24 2004

## 2024-07-17 NOTE — ED Triage Notes (Signed)
 Pt in with itchy rash to L buttocks and R hip pain ongoing x 2 days. Denies any recent falls or injury

## 2024-07-18 MED ORDER — NYSTATIN 100000 UNIT/GM EX CREA: TOPICAL_CREAM | CUTANEOUS | 0 refills | Status: DC

## 2024-07-18 MED ORDER — NYSTATIN 100000 UNIT/GM EX CREA
TOPICAL_CREAM | Freq: Once | CUTANEOUS | Status: AC
  Administered 2024-07-18: 1 via TOPICAL
  Filled 2024-07-18: qty 30

## 2024-07-18 MED ORDER — ACETAMINOPHEN 325 MG PO TABS
650.0000 mg | ORAL_TABLET | Freq: Once | ORAL | Status: AC
  Administered 2024-07-18: 650 mg via ORAL
  Filled 2024-07-18: qty 2

## 2024-07-18 NOTE — Discharge Instructions (Addendum)
 Your workup this evening was reassuring.  I have prescribed a cream to put on the rash on your buttocks.  You may take Tylenol  for your hip pain.  Please follow-up with a primary care provider for further evaluation as needed.  If you develop any life-threatening symptoms return to the emergency room.

## 2024-07-18 NOTE — ED Provider Notes (Signed)
 [place] EMERGENCY DEPARTMENT AT [facility] Provider Note   CSN: [id number] Arrival date & time: 07/17/24  8096     Patient presents with: Hip Pain and Rash   T[place] Estrada is a 63 y.o. male.  Patient with history of expressive aphasia post CVA, CHF of, history of PE presents to the Emergency Department complaining of right-sided hip pain and a rash to the buttocks.  History is difficult to obtain as patient has expressive aphasia.  He is able to nod his head yes and no and answer review of systems type of questions.  He denies any fall or trauma to the hip.  He denies fever, nausea, vomiting, abdominal pain.    Hip Pain  Rash      Prior to Admission medications   Medication Sig Start Date End Date Taking? Authorizing Provider  nystatin  cream (MYCOSTATIN ) Apply to affected area 2 times daily 07/18/24  Yes Logan Ubaldo NOVAK, PA-C  albuterol  (PROVENTIL ) (2.5 MG/3ML) 0.083% nebulizer solution Take 3 mLs (2.5 mg total) by nebulization every 4 (four) hours as needed for wheezing or shortness of breath. 08/16/22   Lou Claretta HERO, MD  atorvastatin  (LIPITOR) 40 MG tablet Take 1 tablet (40 mg total) by mouth daily. 02/06/24 02/05/25  Gregary Sharper, MD  enoxaparin  (LOVENOX ) 100 MG/ML injection Inject 0.85 mLs (85 mg total) into the skin every 12 (twelve) hours. 07/02/24   Ghimire, Kuber, MD  levETIRAcetam  (KEPPRA ) 500 MG tablet Take 1 tablet (500 mg total) by mouth 2 (two) times daily. 04/18/24   Whitfield Raisin, NP  Multiple Vitamin (MULTIVITAMIN WITH MINERALS) TABS tablet Take 1 tablet by mouth daily.    [provider]  pantoprazole  (PROTONIX ) 40 MG tablet TAKE 1 TABLET(40 MG) BY MOUTH DAILY 07/08/24   Tawkaliyar, Roya, DO  sucralfate  (CARAFATE ) 1 g tablet Take 1 tablet (1 g total) by mouth 4 (four) times daily -  with meals and at bedtime. 07/13/24   Doretha Folks, MD  Tiotropium Bromide-Olodaterol (STIOLTO RESPIMAT ) 2.5-2.5 MCG/ACT AERS NEW PRESCRIPTION REQUEST:  STIOLTO 2.5 MCG- INHALE TWO PUFFS BY MOUTH DAILY Patient taking differently: Take 2 puffs by mouth daily at 12 noon. 02/07/24   Gregary Sharper, MD    Allergies: Patient has no known allergies.    Review of Systems  Skin:  Positive for rash.    Updated Vital Signs BP (!) 158/98 (BP Location: Right Arm)   Pulse 77   Temp 97.9 F (36.6 C) (Oral)   Resp 18   Wt 86 kg   SpO2 97%   BMI 25.01 kg/m   Physical Exam Vitals and nursing note reviewed.  Constitutional:      General: He is not in acute distress.    Appearance: He is well-developed.  HENT:     Head: Normocephalic and atraumatic.  Eyes:     Conjunctiva/sclera: Conjunctivae normal.  Cardiovascular:     Rate and Rhythm: Normal rate and regular rhythm.     Heart sounds: No murmur heard. Pulmonary:     Effort: Pulmonary effort is normal. No respiratory distress.     Breath sounds: Normal breath sounds.  Abdominal:     Palpations: Abdomen is soft.     Tenderness: There is no abdominal tenderness.  Musculoskeletal:        General: No swelling. Normal range of motion.     Cervical back: Neck supple.     Comments: No significant right hip pain with passive range of motion.  No deformity noted.  Palpable pedal pulse.  Sensation at baseline  Skin:    General: Skin is warm and dry.     Capillary Refill: Capillary refill takes less than 2 seconds.     Comments: Patient has a mildly erythematous rash near the gluteal crease.  No drainage, no induration or fluctuance noted  Neurological:     Mental Status: He is alert. Mental status is at baseline.  Psychiatric:        Mood and Affect: Mood normal.     (all labs ordered are listed, but only abnormal results are displayed) Labs Reviewed  CBC WITH DIFFERENTIAL/PLATELET  BASIC METABOLIC PANEL WITH GFR    EKG: None  Radiology: DG Hip Unilat W or Wo Pelvis 2-3 Views Right Result Date: 07/17/2024 CLINICAL DATA:  Right hip pain without known injury. EXAM: DG HIP (WITH OR  WITHOUT PELVIS) 2-3V RIGHT COMPARISON:  None Available. FINDINGS: There is no evidence of hip fracture or dislocation. There is no evidence of arthropathy or other focal bone abnormality. IMPRESSION: Negative. Electronically Signed   By: [name] Landy Raddle M.D.   On: 07/17/2024 20:28     Procedures   Medications Ordered in the ED  nystatin  cream (MYCOSTATIN ) (1 Application Topical Given 07/18/24 0117)  acetaminophen  (TYLENOL ) tablet 650 mg (650 mg Oral Given 07/18/24 0117)                                    Medical Decision Making Risk OTC drugs. Prescription drug management.   This patient presents to the ED for concern of hip pain and skin rash, this involves an extensive number of treatment options, and is a complaint that carries with it a high risk of complications and morbidity.  The differential diagnosis includes fracture, dislocation, arthritis, soft tissue injury, others.  Differential for the rash includes cellulitis, fungal infection, others   Co morbidities / Chronic conditions that complicate the patient evaluation  Expressive aphasia   Additional history obtained:  Additional history obtained from EMR External records from outside source obtained and reviewed including discharge summary from earlier in July when patient was admitted for pulmonary embolism   Lab Tests:  I Ordered, and personally interpreted labs.  The pertinent results include: Unremarkable CBC, BMP   Imaging Studies ordered:  I ordered imaging studies including plain films of the right hip I independently visualized and interpreted imaging which showed no acute findings I agree with the radiologist interpretation   Problem List / ED Course / Critical interventions / Medication management   I ordered medication including nystatin  cream, Tylenol  Reevaluation of the patient after these medicines showed that the patient improved I have reviewed the patients home medicines and have made  adjustments as needed   Social Determinants of Health:  Patient has Medicaid for his primary health insurance type   Test / Admission - Considered:  Patient with no acute findings to explain his right hip pain.  Possibly arthritic in nature.  I have recommended Tylenol  for continued pain management.  The patient does have a rash consistent with Candida.  Nystatin  cream prescribed.  Patient to follow-up with primary care.  No abnormal labs or vitals to suggest sepsis or severe infection.  Patient stable for discharge home.      Final diagnoses:  Rash and nonspecific skin eruption  Right hip pain    ED Discharge Orders  Ordered    nystatin  cream (MYCOSTATIN )        07/18/24 0157               Logan Ubaldo KATHEE DEVONNA 07/18/24 LUCILE Griselda Norris, MD 07/18/24 [phone]

## 2024-07-22 ENCOUNTER — Other Ambulatory Visit: Payer: Self-pay

## 2024-07-22 ENCOUNTER — Other Ambulatory Visit: Payer: Self-pay | Admitting: *Deleted

## 2024-07-22 ENCOUNTER — Emergency Department (HOSPITAL_COMMUNITY)

## 2024-07-22 ENCOUNTER — Emergency Department (HOSPITAL_COMMUNITY): Admission: EM | Admit: 2024-07-22 | Discharge: 2024-07-22 | Disposition: A | Discharge status: Home / Self Care

## 2024-07-22 ENCOUNTER — Encounter (HOSPITAL_COMMUNITY): Payer: Self-pay | Admitting: Emergency Medicine

## 2024-07-22 DIAGNOSIS — Y92019 Unspecified place in single-family (private) house as the place of occurrence of the external cause: Secondary | ICD-10-CM | POA: Diagnosis not present

## 2024-07-22 DIAGNOSIS — M25511 Pain in right shoulder: Secondary | ICD-10-CM | POA: Insufficient documentation

## 2024-07-22 DIAGNOSIS — R4701 Aphasia: Secondary | ICD-10-CM | POA: Diagnosis not present

## 2024-07-22 DIAGNOSIS — W19XXXA Unspecified fall, initial encounter: Secondary | ICD-10-CM | POA: Diagnosis not present

## 2024-07-22 DIAGNOSIS — S20211A Contusion of right front wall of thorax, initial encounter: Secondary | ICD-10-CM | POA: Insufficient documentation

## 2024-07-22 DIAGNOSIS — S299XXA Unspecified injury of thorax, initial encounter: Secondary | ICD-10-CM | POA: Diagnosis present

## 2024-07-22 MED ORDER — OXYCODONE-ACETAMINOPHEN 5-325 MG PO TABS
1.0000 | ORAL_TABLET | Freq: Once | ORAL | Status: AC
  Administered 2024-07-22: 1 via ORAL
  Filled 2024-07-22: qty 1

## 2024-07-22 MED ORDER — LIDOCAINE 5 % EX PTCH: 1.0000 | MEDICATED_PATCH | CUTANEOUS | 0 refills | Status: DC

## 2024-07-22 MED ORDER — ENOXAPARIN SODIUM 100 MG/ML IJ SOSY: 1.0000 mg/kg | PREFILLED_SYRINGE | Freq: Two times a day (BID) | INTRAMUSCULAR | 2 refills | Status: DC

## 2024-07-22 NOTE — ED Provider Notes (Signed)
 [place] EMERGENCY DEPARTMENT AT [facility] Provider Note   CSN: [id number] Arrival date & time: 07/22/24  1158     Patient presents with: Estrada Estrada is a 63 y.o. male.   63 year old male with past medical history of seizures as well as CVA with expressive aphasia presenting to the emergency department today with right-sided chest wall pain, right shoulder pain after a mechanical fall at home.  The patient states that he fell this morning.  Does not think that he hit his head but obtaining history is difficult due to the patient's expressive aphasia.  He is endorsing some right sided chest wall pain and pain over the right scapula.  He reports that he did not lose consciousness.   Fall       Prior to Admission medications   Medication Sig Start Date End Date Taking? Authorizing Provider  lidocaine  (LIDODERM ) 5 % Place 1 patch onto the skin daily. Remove & Discard patch within 12 hours or as directed by MD 07/22/24  Yes Ula Prentice SAUNDERS, MD  albuterol  (PROVENTIL ) (2.5 MG/3ML) 0.083% nebulizer solution Take 3 mLs (2.5 mg total) by nebulization every 4 (four) hours as needed for wheezing or shortness of breath. 08/16/22   Lou Claretta HERO, MD  atorvastatin  (LIPITOR) 40 MG tablet Take 1 tablet (40 mg total) by mouth daily. 02/06/24 02/05/25  Gregary Sharper, MD  enoxaparin  (LOVENOX ) 100 MG/ML injection Inject 0.85 mLs (85 mg total) into the skin every 12 (twelve) hours. 07/02/24   Raenelle Coria, MD  levETIRAcetam  (KEPPRA ) 500 MG tablet Take 1 tablet (500 mg total) by mouth 2 (two) times daily. 04/18/24   Whitfield Raisin, NP  Multiple Vitamin (MULTIVITAMIN WITH MINERALS) TABS tablet Take 1 tablet by mouth daily.    [provider]  nystatin  cream (MYCOSTATIN ) Apply to affected area 2 times daily 07/18/24   Logan Ubaldo NOVAK, PA-C  pantoprazole  (PROTONIX ) 40 MG tablet TAKE 1 TABLET(40 MG) BY MOUTH DAILY 07/08/24   Tawkaliyar, Roya, DO  sucralfate  (CARAFATE ) 1 g tablet  Take 1 tablet (1 g total) by mouth 4 (four) times daily -  with meals and at bedtime. 07/13/24   Doretha Folks, MD  Tiotropium Bromide-Olodaterol (STIOLTO RESPIMAT ) 2.5-2.5 MCG/ACT AERS NEW PRESCRIPTION REQUEST: STIOLTO 2.5 MCG- INHALE TWO PUFFS BY MOUTH DAILY Patient taking differently: Take 2 puffs by mouth daily at 12 noon. 02/07/24   Gregary Sharper, MD    Allergies: Patient has no known allergies.    Review of Systems  Respiratory:         Chest wall pain  Musculoskeletal:        Shoulder pain  All other systems reviewed and are negative.   Updated Vital Signs BP 137/83 (BP Location: Right Arm)   Pulse 94   Temp 98.3 F (36.8 C) (Oral)   Resp 18   SpO2 98%   Physical Exam Vitals and nursing note reviewed.   Gen: NAD Eyes: PERRL, EOMI HEENT: no oropharyngeal swelling Neck: trachea midline Resp: clear to auscultation bilaterally, tender over R chest wall Card: RRR, no murmurs, rubs, or gallops Abd: nontender, nondistended Extremities: no calf tenderness, no edema Vascular: 2+ radial pulses bilaterally, 2+ DP pulses bilaterally Skin: no rashes Psyc: acting appropriately   (all labs ordered are listed, but only abnormal results are displayed) Labs Reviewed - No data to display  EKG: EKG Interpretation Date/Time:  Monday July 22 2024 15:35:13 EDT Ventricular Rate:  59 PR Interval:  192 QRS  Duration:  84 QT Interval:  380 QTC Calculation: 376 R Axis:   48  Text Interpretation: Sinus bradycardia with sinus arrhythmia Anterior infarct , age undetermined Abnormal ECG When compared with ECG of 13-Jul-2024 13:28, PREVIOUS ECG IS PRESENT Confirmed by Ula Barter [phone]) on 07/22/2024 3:49:34 PM  Radiology: CT Cervical Spine Wo Contrast Result Date: 07/22/2024 CLINICAL DATA:  Provided history: Neck trauma, dangerous injury mechanism (Age 91-64y) EXAM: CT CERVICAL SPINE WITHOUT CONTRAST TECHNIQUE: Multidetector CT imaging of the cervical spine was performed without  intravenous contrast. Multiplanar CT image reconstructions were also generated. RADIATION DOSE REDUCTION: This exam was performed according to the departmental dose-optimization program which includes automated exposure control, adjustment of the mA and/or kV according to patient size and/or use of iterative reconstruction technique. COMPARISON:  CT cervical spine 06/13/2024 FINDINGS: Alignment: Normal. Skull base and vertebrae: No acute fracture. Vertebral body heights are maintained. The dens and skull base are intact. Soft tissues and spinal canal: No prevertebral fluid or swelling. No visible canal hematoma. Disc levels: Bulky anterior osteophytes in the lower cervical spine. Stable multilevel disc space narrowing and disc bulging. Upper chest: Emphysema. Chronic calcified granuloma in the right lung apex. Persistent opacity in the perifissural left upper lobe without change from 07/09/2024 chest CT, likely attributable to scarring. No acute apical findings. Other: Carotid calcifications. IMPRESSION: 1. No acute fracture or subluxation of the cervical spine. 2. Degenerative change in the lower cervical spine. Electronically Signed   By: Andrea Gasman M.D.   On: 07/22/2024 15:45   CT Head Wo Contrast Result Date: 07/22/2024 CLINICAL DATA:  Provided history: Head trauma, abnormal mental status (Age 47-64y) Fall today.  Struck head. EXAM: CT HEAD WITHOUT CONTRAST TECHNIQUE: Contiguous axial images were obtained from the base of the skull through the vertex without intravenous contrast. RADIATION DOSE REDUCTION: This exam was performed according to the departmental dose-optimization program which includes automated exposure control, adjustment of the mA and/or kV according to patient size and/or use of iterative reconstruction technique. COMPARISON:  CT 07/09/2024 FINDINGS: Brain: No acute intracranial hemorrhage. No subdural or extra-axial collection. Extensive encephalomalacia throughout the left cerebral  hemisphere. Cortical calcifications in the left parietal lobe, unchanged. Left cerebellar encephalomalacia is unchanged. Periventricular and deep white matter hypodensity typical of chronic small vessel ischemia. Stable ex vacuo dilatation of the left lateral ventricle. No evidence of acute ischemia. Vascular: No acute findings. Skull: No fracture or focal lesion. Sinuses/Orbits: Chronic opacification of the right maxillary sinus. Mucosal thickening throughout ethmoid air cells and left maxillary sinus. Chronic fluid within the inferior mastoid air cells. No acute findings. Other: Scalp thickening in the posterior right vertex was present on prior exam and may represent scarring. IMPRESSION: 1. No acute intracranial abnormality. No skull fracture. 2. Stable remote infarcts and chronic small vessel ischemia. Electronically Signed   By: Andrea Gasman M.D.   On: 07/22/2024 15:40   DG Shoulder Right Result Date: 07/22/2024 CLINICAL DATA:  Fall, injury. EXAM: RIGHT SHOULDER - 2+ VIEW COMPARISON:  Chest radiograph 07/22/2024 and CT chest 07/09/2024. FINDINGS: Osteopenia. No definite fracture or dislocation. Moderate right acromioclavicular joint osteoarthritis. Fiducial marker and associated opacification in the lateral right chest. Scattered calcified granulomas. IMPRESSION: 1. No acute osseous or joint abnormality. 2. Moderate right acromioclavicular joint osteoarthritis. 3. Fiducial marker and rounded in opacification in the lateral right chest, better evaluated on chest radiograph 07/13/2024 and CT chest 07/09/2024. Electronically Signed   By: Newell Eke M.D.   On: 07/22/2024 14:02  DG Chest Portable 1 View Result Date: 07/22/2024 EXAM: 1 VIEW XRAY OF THE CHEST 07/22/2024 01:45:00 PM COMPARISON: 07/13/2024 CLINICAL HISTORY: Fall. Fall, injury. FINDINGS: LUNGS AND PLEURA: Right upper lobe subpleural scarring with probable radiation fiducial, similar to prior. Diffuse interstitial thickening with apical  emphysema. No focal pulmonary opacity. No pulmonary edema. No pleural effusion. No pneumothorax. HEART AND MEDIASTINUM: No acute abnormality of the cardiac and mediastinal silhouettes. BONES AND SOFT TISSUES: No acute osseous abnormality. IMPRESSION: 1. No acute process. Electronically signed by: Rockey Kilts MD 07/22/2024 02:00 PM EDT RP Workstation: [id number]     Procedures   Medications Ordered in the ED  oxyCODONE -acetaminophen  (PERCOCET/ROXICET) 5-325 MG per tablet 1 tablet (1 tablet Oral Given 07/22/24 1245)                                    Medical Decision Making 63 year old male with past medical history of CVA with expressive aphasia and seizures presents to the emergency department today with right-sided chest wall pain and right shoulder pain after a mechanical fall.  I will further evaluate the patient here with a CT scan of his head and cervical spine in addition to a chest x-ray and x-ray of his right shoulder for further evaluation.  I will give the patient Percocet for pain.  Sounds more like a mechanical fall but will obtain an EKG to evaluate for any obvious abnormalities.  Suspect that he will be discharged as workup is reassuring.  The patient's work appears reassuring.  His x-rays and CT scans do not show any concerning acute findings.  The patient be discharged with return precautions.  Amount and/or Complexity of Data Reviewed Radiology: ordered.  Risk Prescription drug management.        Final diagnoses:  Fall, initial encounter  Contusion of right chest wall, initial encounter    ED Discharge Orders          Ordered    lidocaine  (LIDODERM ) 5 %  Every 24 hours        07/22/24 1549               Ula Prentice SAUNDERS, MD 07/22/24 1550

## 2024-07-22 NOTE — Telephone Encounter (Signed)
 Copied from CRM [id number]. Topic: Clinical - Prescription Issue >> Jul 22, 2024 12:41 PM Mercer PEDLAR wrote: Reason for CRM: Patient is requesting for enoxaparin  (LOVENOX ) 100 MG/ML injection to be sent to [facility] instead of [facility].   [id number] [place], [place] - [address] AT [facility] [address] [place] [address] Phone: [phone] Fax: [phone]

## 2024-07-22 NOTE — Discharge Instructions (Addendum)
 Your x-rays and CT scans did not show any concerning acute findings.  Please follow-up with your doctor and use the lidocaine  patches as needed.  Return to the ER for worsening symptoms.

## 2024-07-22 NOTE — ED Notes (Signed)
 Attempted to contact cousin to pick pt up. No answer.

## 2024-07-22 NOTE — Telephone Encounter (Signed)
 Per chart, pt was recently in the ER today.

## 2024-07-22 NOTE — ED Triage Notes (Signed)
 Patient presents due to a fall today. Report reports hitting his head. Patient reportedly take blood thinners.

## 2024-07-22 NOTE — Telephone Encounter (Signed)
 Thank you Dr Raenelle. I called and talked to pt's caregiver who stated pt did get Lovenox  from [facility]; now he he has I box left. And he's requesting a refill and to send rx to [facility].

## 2024-07-29 ENCOUNTER — Emergency Department (HOSPITAL_COMMUNITY)

## 2024-07-29 ENCOUNTER — Emergency Department (HOSPITAL_COMMUNITY)
Admission: EM | Admit: 2024-07-29 | Discharge: 2024-07-29 | Disposition: A | Discharge status: Home / Self Care | Attending: Emergency Medicine | Admitting: Emergency Medicine

## 2024-07-29 ENCOUNTER — Encounter (HOSPITAL_COMMUNITY): Payer: Self-pay

## 2024-07-29 ENCOUNTER — Other Ambulatory Visit: Payer: Self-pay

## 2024-07-29 DIAGNOSIS — Z85118 Personal history of other malignant neoplasm of bronchus and lung: Secondary | ICD-10-CM | POA: Insufficient documentation

## 2024-07-29 DIAGNOSIS — Z87891 Personal history of nicotine dependence: Secondary | ICD-10-CM | POA: Insufficient documentation

## 2024-07-29 DIAGNOSIS — R0789 Other chest pain: Secondary | ICD-10-CM

## 2024-07-29 DIAGNOSIS — I5089 Other heart failure: Secondary | ICD-10-CM | POA: Insufficient documentation

## 2024-07-29 DIAGNOSIS — Z8673 Personal history of transient ischemic attack (TIA), and cerebral infarction without residual deficits: Secondary | ICD-10-CM | POA: Diagnosis not present

## 2024-07-29 DIAGNOSIS — J4489 Other specified chronic obstructive pulmonary disease: Secondary | ICD-10-CM | POA: Diagnosis not present

## 2024-07-29 DIAGNOSIS — I110 Hypertensive heart disease with heart failure: Secondary | ICD-10-CM | POA: Diagnosis not present

## 2024-07-29 DIAGNOSIS — R109 Unspecified abdominal pain: Secondary | ICD-10-CM | POA: Diagnosis present

## 2024-07-29 LAB — BASIC METABOLIC PANEL WITH GFR
Anion gap: 12 (ref 5–15)
BUN: 12 mg/dL (ref 8–23)
CO2: 26 mmol/L (ref 22–32)
Calcium: 9.5 mg/dL (ref 8.9–10.3)
Chloride: 103 mmol/L (ref 98–111)
Creatinine, Ser: 0.63 mg/dL (ref 0.61–1.24)
GFR, Estimated: >60 mL/min (ref >60)
Glucose, Bld: 96 mg/dL (ref 70–99)
Potassium: 3.7 mmol/L (ref 3.5–5.1)
Sodium: 141 mmol/L (ref 135–145)

## 2024-07-29 LAB — CBC
HCT: 40.6 % (ref 39.0–52.0)
Hemoglobin: 12.9 g/dL — ABNORMAL LOW (ref 13.0–17.0)
MCH: 29.5 pg (ref 26.0–34.0)
MCHC: 31.8 g/dL (ref 30.0–36.0)
MCV: 92.7 fL (ref 80.0–100.0)
Platelets: 143 K/uL — ABNORMAL LOW (ref 150–400)
RBC: 4.38 MIL/uL (ref 4.22–5.81)
RDW: 13.9 % (ref 11.5–15.5)
WBC: 4.5 K/uL (ref 4.0–10.5)
nRBC: 0 % (ref 0.0–0.2)

## 2024-07-29 LAB — TROPONIN I (HIGH SENSITIVITY)
Troponin I (High Sensitivity): 4 ng/L (ref <18)
Troponin I (High Sensitivity): 5 ng/L (ref <18)

## 2024-07-29 MED ORDER — IOHEXOL 350 MG/ML SOLN
100.0000 mL | Freq: Once | INTRAVENOUS | Status: AC | PRN
  Administered 2024-07-29 (×2): 100 mL via INTRAVENOUS

## 2024-07-29 NOTE — ED Provider Notes (Signed)
 [employer] EMERGENCY DEPARTMENT AT [facility] Provider Note  CSN: [id number] Arrival date & time: 07/29/24 1454  Chief Complaint(s) Chest Pain  HPI Todd Estrada is a 63 y.o. male history of COPD, lung cancer, prior stroke, GERD, aphasia, pulmonary embolism on Lovenox  presenting with chest pain, belly pain.  History is somewhat limited due to aphasia and the patient has no family with him but he is able to answer questions with yes or no.  He indicates that his chest pain began yesterday.  He also reports that the belly pain began yesterday.  Denies any fevers or chills.  Denies any nausea or vomiting   Past Medical History Past Medical History:  Diagnosis Date   Asthma    Atypical chest pain 08/13/2022   Community acquired pneumonia 09/14/2022   COPD (chronic obstructive pulmonary disease) (HCC)    Essential hypertension 08/19/2021   GERD (gastroesophageal reflux disease)    HAP (hospital-acquired pneumonia) 09/16/2022   History of tracheostomy    03/09/22-04/11/22   HLD (hyperlipidemia)    Hypertension    Hypokalemia 08/13/2022   Hypomagnesemia 08/13/2022   Lung cancer (HCC)    PAD (peripheral artery disease) (HCC)    Seizures (HCC) 06/02/2022   Sepsis (HCC) 08/13/2022   Sepsis due to pneumonia (HCC) 04/13/2023   Stroke (HCC) 02/2022   Patient Active Problem List   Diagnosis Date Noted   Acute pulmonary embolism (HCC) 06/30/2024   Coronary artery calcification of native artery 04/29/2024   History of stroke 04/29/2024   PAF (paroxysmal atrial fibrillation) (HCC) 04/28/2024   Acute cough 04/23/2024   Edema 04/23/2024   CVA (cerebral vascular accident) (HCC) 01/29/2024   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 07/07/2023   Buttock wound, right, subsequent encounter 03/01/2023   PAD (peripheral artery disease) (HCC) 01/06/2023   Hypotension 01/06/2023   History of pulmonary embolism 10/12/2022   Cervical transverse process fracture (HCC) 09/10/2022   Anemia of  chronic disease 08/13/2022   Chronic heart failure with mildly reduced ejection fraction (HFmrEF) (HCC) 08/13/2022   Necrotizing pneumonia (HCC) 08/12/2022   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 Authorizing Provider  albuterol  (PROVENTIL ) (2.5 MG/3ML) 0.083% nebulizer solution Take 3 mLs (2.5 mg total) by nebulization every 4 (four) hours as needed for wheezing or shortness of breath. 08/16/22   Lou Claretta HERO, MD  atorvastatin  (LIPITOR) 40 MG tablet Take 1 tablet (40 mg total) by mouth daily. 02/06/24 02/05/25  Gregary Sharper, MD  enoxaparin  (LOVENOX ) 100 MG/ML injection Inject 0.85 mLs (85 mg total) into the skin every 12 (twelve) hours. 07/22/24   Marylu Gee, DO  levETIRAcetam  (KEPPRA ) 500 MG tablet Take 1 tablet (500 mg total) by mouth 2 (two) times daily. 04/18/24   Whitfield Raisin, NP  lidocaine  (LIDODERM ) 5 % Place 1 patch onto the skin daily. Remove & Discard patch within 12 hours or as directed by MD 07/22/24   Ula Prentice SAUNDERS, MD  Multiple Vitamin (MULTIVITAMIN WITH MINERALS) TABS tablet Take 1 tablet by mouth daily.    [provider]  nystatin  cream (MYCOSTATIN ) Apply to affected area 2 times daily 07/18/24   Logan Martinis B, PA-C  pantoprazole  (PROTONIX ) 40 MG tablet TAKE 1 TABLET(40 MG) BY MOUTH DAILY 07/08/24   Tawkaliyar, Roya, DO  sucralfate  (CARAFATE ) 1 g tablet Take 1 tablet (1 g total) by mouth 4 (four) times daily -  with meals and at bedtime. 07/13/24   Doretha Folks, MD  Tiotropium Bromide-Olodaterol  (STIOLTO RESPIMAT ) 2.5-2.5 MCG/ACT AERS NEW PRESCRIPTION REQUEST: STIOLTO 2.5 MCG- INHALE TWO PUFFS BY MOUTH DAILY Patient taking differently: Take 2 puffs by mouth daily at 12 noon. 02/07/24   Gregary Sharper, MD                                                                                                                                    Past Surgical History Past Surgical History:  Procedure Laterality Date   BRONCHIAL BIOPSY  05/30/2022   Procedure: BRONCHIAL BIOPSIES;  Surgeon: Shelah Lamar RAMAN, MD;  Location: [facility] ENDOSCOPY;  Service: Pulmonary;;   BRONCHIAL BRUSHINGS  05/30/2022   Procedure: BRONCHIAL BRUSHINGS;  Surgeon: Shelah Lamar RAMAN, MD;  Location[facility] ENDOSCOPY;  Service: Pulmonary;;   BRONCHIAL NEEDLE ASPIRATION BIOPSY  05/30/2022   Procedure: BRONCHIAL NEEDLE ASPIRATION BIOPSIES;  Surgeon: Shelah Lamar RAMAN, MD;  Location: [facility] ENDOSCOPY;  Service: Pulmonary;;   BRONCHIAL WASHINGS  05/30/2022   Procedure: BRONCHIAL WASHINGS;  Surgeon: Shelah Lamar RAMAN, MD;  Location: [facility] ENDOSCOPY;  Service: Pulmonary;;   CHOLECYSTECTOMY N/A 07/05/2023   Procedure: LAPAROSCOPIC CHOLECYSTECTOMY WITH INTRAOPERATIVE CHOLANGIOGRAM;  Surgeon: Sheldon Standing, MD;  Location: [facility] ORS;  Service: General;  Laterality: N/A;   ERCP N/A 07/06/2023   Procedure: ENDOSCOPIC RETROGRADE CHOLANGIOPANCREATOGRAPHY (ERCP);  Surgeon: Rosalie Kitchens, MD;  Location: [facility] ENDOSCOPY;  Service: Gastroenterology;  Laterality: N/A;   ESOPHAGOGASTRODUODENOSCOPY (EGD) WITH PROPOFOL  N/A 03/11/2022   Procedure: ESOPHAGOGASTRODUODENOSCOPY (EGD) WITH PROPOFOL ;  Surgeon: Sebastian Moles, MD;  Location: [facility] ENDOSCOPY;  Service: General;  Laterality: N/A;   FIDUCIAL MARKER PLACEMENT  05/30/2022   Procedure: FIDUCIAL MARKER PLACEMENT;  Surgeon: Shelah Lamar RAMAN, MD;  Location: [facility] ENDOSCOPY;  Service: Pulmonary;;   IR ANGIO INTRA EXTRACRAN SEL COM CAROTID INNOMINATE UNI R MOD SED  03/02/2022   IR CT HEAD LTD  03/02/2022   IR PERCUTANEOUS ART  THROMBECTOMY/INFUSION INTRACRANIAL INC DIAG ANGIO  03/02/2022   PEG PLACEMENT N/A 03/11/2022   Procedure: PERCUTANEOUS ENDOSCOPIC GASTROSTOMY (PEG) PLACEMENT;  Surgeon: Sebastian Moles, MD;  Location: [facility] ENDOSCOPY;  Service: General;  Laterality: N/A;   RADIOLOGY WITH ANESTHESIA N/A 03/02/2022   Procedure: IR WITH ANESTHESIA;  Surgeon: Dolphus Carrion, MD;  Location: [facility] OR;  Service: Radiology;  Laterality: N/A;   REMOVAL OF STONES  07/06/2023   Procedure: REMOVAL OF STONES;  Surgeon: Rosalie Kitchens, MD;  Location: [facility] ENDOSCOPY;  Service: Gastroenterology;;   ANNETT  07/06/2023   Procedure: ANNETT;  Surgeon: Rosalie Kitchens, MD;  Location: [facility] ENDOSCOPY;  Service: Gastroenterology;;  VIDEO BRONCHOSCOPY WITH RADIAL ENDOBRONCHIAL ULTRASOUND  05/30/2022   Procedure: VIDEO BRONCHOSCOPY WITH RADIAL ENDOBRONCHIAL ULTRASOUND;  Surgeon: Shelah Lamar RAMAN, MD;  Location: [facility] ENDOSCOPY;  Service: Pulmonary;;   Family History Family History  Problem Relation Age of Onset   Throat cancer Mother    Liver cancer Father    Kidney failure Sister    Cancer - Lung Paternal Uncle     Social History Social History   Tobacco Use   Smoking status: Former    Current packs/day: 0.00    Types: Cigarettes    Quit date: 05/02/2022    Years since quitting: 2.2    Passive exposure: Never   Smokeless tobacco: Never  Vaping Use   Vaping status: Never Used  Substance Use Topics   Alcohol use: Not Currently   Drug use: No   Allergies Patient has no known allergies.  Review of Systems Review of Systems  All other systems reviewed and are negative.   Physical Exam Vital Signs  I have reviewed the triage vital signs BP (!) 136/101   Pulse 80   Temp 98.2 F (36.8 C) (Oral)   Resp 14   Ht 6' 1 (1.854 m)   Wt 86 kg   SpO2 98%   BMI 25.01 kg/m  Physical Exam Vitals and nursing note reviewed.  Constitutional:      General: He is not in acute distress.    Appearance: Normal appearance.   HENT:     Mouth/Throat:     Mouth: Mucous membranes are moist.  Eyes:     Conjunctiva/sclera: Conjunctivae normal.  Cardiovascular:     Rate and Rhythm: Normal rate and regular rhythm.  Pulmonary:     Effort: Pulmonary effort is normal. No respiratory distress.     Breath sounds: Normal breath sounds.  Abdominal:     General: Abdomen is flat.     Palpations: Abdomen is soft.     Tenderness: There is no abdominal tenderness.  Musculoskeletal:     Right lower leg: No edema.     Left lower leg: No edema.  Skin:    General: Skin is warm and dry.     Capillary Refill: Capillary refill takes less than 2 seconds.  Neurological:     General: No focal deficit present.     Mental Status: He is alert. Mental status is at baseline.     Comments: Aphasic but able to answer yes/no, follows commands in all four extremities  Psychiatric:        Mood and Affect: Mood normal.        Behavior: Behavior normal.     ED Results and Treatments Labs (all labs ordered are listed, but only abnormal results are displayed) Labs Reviewed  CBC - Abnormal; Notable for the following components:      Result Value   Hemoglobin 12.9 (*)    Platelets 143 (*)    All other components within normal limits  BASIC METABOLIC PANEL WITH GFR  TROPONIN I (HIGH SENSITIVITY)  TROPONIN I (HIGH SENSITIVITY)  Radiology CT ABDOMEN PELVIS W CONTRAST Result Date: 07/29/2024 CLINICAL DATA:  High probability pulmonary embolism, chest pain, unspecified abdominal pain EXAM: CT ANGIOGRAPHY CHEST CT ABDOMEN AND PELVIS WITH CONTRAST TECHNIQUE: Multidetector CT imaging of the chest was performed using the standard protocol during bolus administration of intravenous contrast. Multiplanar CT image reconstructions and MIPs were obtained to evaluate the vascular anatomy. Multidetector CT imaging of the abdomen and  pelvis was performed using the standard protocol during bolus administration of intravenous contrast. RADIATION DOSE REDUCTION: This exam was performed according to the departmental dose-optimization program which includes automated exposure control, adjustment of the mA and/or kV according to patient size and/or use of iterative reconstruction technique. CONTRAST:  OMNIPAQUE  IOHEXOL  350 MG/ML SOLN COMPARISON:  07/09/2024, 07/12/2023 FINDINGS: CTA CHEST FINDINGS Cardiovascular: Adequate opacification of the pulmonary arterial tree. No intraluminal filling defect identified to suggest acute pulmonary embolism. Central pulmonary arteries are of normal caliber. Moderate multi-vessel coronary artery calcification. Cardiac size is within normal limits. No pericardial effusion. Mild atherosclerotic calcification within the thoracic aorta. No aortic aneurysm. Mediastinum/Nodes: No enlarged mediastinal, hilar, or axillary lymph nodes. Thyroid gland, trachea, and esophagus demonstrate no significant findings. Lungs/Pleura: Severe emphysema. Platelike scarring within mid lung zones bilaterally. Increasing masslike consolidation left upper lobe within the perihilar region with obliteration of the left upper lobar pulmonary artery within the anterior segment (63/12). This area of consolidation measures approximately 3.8 x 2.8 cm at image # 65/12. Stable peripheral chronic collapse and consolidation anterior segment of the left upper lobe. No confluent pulmonary infiltrate. Bronchial wall thickening noted in keeping with airway inflammation. No pneumothorax or pleural effusion Musculoskeletal: Remote right fifth rib fracture noted laterally. No acute bone abnormality. No lytic or blastic bone lesion. Review of the MIP images confirms the above findings. CT ABDOMEN and PELVIS FINDINGS Hepatobiliary: No focal liver abnormality is seen. Status post cholecystectomy. No biliary dilatation. Pancreas: Unremarkable Spleen:  Unremarkable Adrenals/Urinary Tract: The adrenal glands are unremarkable. The kidneys are normal in size and position. Vascular calcifications are seen within renal hila bilaterally. No definite urinary renal or ureteral calculi are identified. No hydronephrosis. No perinephric inflammatory stranding or fluid collections are seen. Bladder unremarkable. Stomach/Bowel: Stomach is within normal limits. Appendix appears normal. No evidence of bowel wall thickening, distention, or inflammatory changes. Vascular/Lymphatic: Advanced aortoiliac atherosclerotic calcification. 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 No pathologic adenopathy within the abdomen and pelvis. Reproductive: Prostate is unremarkable. Other: Tiny fat containing epigastric ventral hernia subcutaneous gas within the anterior abdominal wall within the epigastric region may relate to subcutaneous injection, but is nonspecific. Musculoskeletal: No acute or significant osseous findings. Review of the MIP images confirms the above findings. IMPRESSION: 1. No pulmonary embolism. 2. Moderate multi-vessel coronary artery calcification. 3. Severe emphysema. 4. Increasing masslike consolidation within the left upper lobe within the perihilar region with obliteration of the left upper lobar pulmonary artery within the anterior segment. This area of consolidation measures approximately 3.8 x 2.8 cm at image # 65/12. While this may represent an area of progressive postobstructive collapse, a developing neoplasm is not excluded. PET CT examination is recommended for further evaluation when the patient's acute issues have resolved. 5. No acute intra-abdominal pathology identified. 6. Advanced aortoiliac atherosclerotic calcification. 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 If there is evidence  peripheral vascular insufficiency, CT arteriography may be helpful for  further evaluation. Aortic Atherosclerosis (ICD10-I70.0) and Emphysema (ICD10-J43.9). Electronically Signed   By: Dorethia Molt M.D.   On: 07/29/2024 23:13   CT Angio Chest PE W and/or Wo Contrast Result Date: 07/29/2024 CLINICAL DATA:  High probability pulmonary embolism, chest pain, unspecified abdominal pain EXAM: CT ANGIOGRAPHY CHEST CT ABDOMEN AND PELVIS WITH CONTRAST TECHNIQUE: Multidetector CT imaging of the chest was performed using the standard protocol during bolus administration of intravenous contrast. Multiplanar CT image reconstructions and MIPs were obtained to evaluate the vascular anatomy. Multidetector CT imaging of the abdomen and pelvis was performed using the standard protocol during bolus administration of intravenous contrast. RADIATION DOSE REDUCTION: This exam was performed according to the departmental dose-optimization program which includes automated exposure control, adjustment of the mA and/or kV according to patient size and/or use of iterative reconstruction technique. CONTRAST:  OMNIPAQUE  IOHEXOL  350 MG/ML SOLN COMPARISON:  07/09/2024, 07/12/2023 FINDINGS: CTA CHEST FINDINGS Cardiovascular: Adequate opacification of the pulmonary arterial tree. No intraluminal filling defect identified to suggest acute pulmonary embolism. Central pulmonary arteries are of normal caliber. Moderate multi-vessel coronary artery calcification. Cardiac size is within normal limits. No pericardial effusion. Mild atherosclerotic calcification within the thoracic aorta. No aortic aneurysm. Mediastinum/Nodes: No enlarged mediastinal, hilar, or axillary lymph nodes. Thyroid gland, trachea, and esophagus demonstrate no significant findings. Lungs/Pleura: Severe emphysema. Platelike scarring within mid lung zones bilaterally. Increasing masslike consolidation left upper lobe within the perihilar region with obliteration of the left  upper lobar pulmonary artery within the anterior segment (63/12). This area of consolidation measures approximately 3.8 x 2.8 cm at image # 65/12. Stable peripheral chronic collapse and consolidation anterior segment of the left upper lobe. No confluent pulmonary infiltrate. Bronchial wall thickening noted in keeping with airway inflammation. No pneumothorax or pleural effusion Musculoskeletal: Remote right fifth rib fracture noted laterally. No acute bone abnormality. No lytic or blastic bone lesion. Review of the MIP images confirms the above findings. CT ABDOMEN and PELVIS FINDINGS Hepatobiliary: No focal liver abnormality is seen. Status post cholecystectomy. No biliary dilatation. Pancreas: Unremarkable Spleen: Unremarkable Adrenals/Urinary Tract: The adrenal glands are unremarkable. The kidneys are normal in size and position. Vascular calcifications are seen within renal hila bilaterally. No definite urinary renal or ureteral calculi are identified. No hydronephrosis. No perinephric inflammatory stranding or fluid collections are seen. Bladder unremarkable. Stomach/Bowel: Stomach is within normal limits. Appendix appears normal. No evidence of bowel wall thickening, distention, or inflammatory changes. Vascular/Lymphatic: Advanced aortoiliac atherosclerotic calcification. 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 No pathologic adenopathy within the abdomen and pelvis. Reproductive: Prostate is unremarkable. Other: Tiny fat containing epigastric ventral hernia subcutaneous gas within the anterior abdominal wall within the epigastric region may relate to subcutaneous injection, but is nonspecific. Musculoskeletal: No acute or significant osseous findings. Review of the MIP images confirms the above findings. IMPRESSION: 1. No pulmonary embolism. 2. Moderate multi-vessel coronary artery calcification. 3. Severe emphysema. 4.  Increasing masslike consolidation within the left upper lobe within the perihilar region with obliteration of the left upper lobar pulmonary artery within the anterior segment. This area of consolidation measures approximately 3.8 x 2.8 cm at image # 65/12. While this may represent an area of progressive postobstructive collapse, a developing neoplasm is not excluded. PET CT examination is recommended for further evaluation when the patient's acute issues have resolved. 5. No acute intra-abdominal pathology identified. 6. Advanced aortoiliac atherosclerotic calcification. Chronic thrombosis of the left common  iliac artery and extensive disease of the lower extremity arterial inflow bilaterally. If there is evidence peripheral vascular insufficiency, CT arteriography may be helpful for further evaluation. Aortic Atherosclerosis (ICD10-I70.0) and Emphysema (ICD10-J43.9). Electronically Signed   By: Dorethia Molt M.D.   On: 07/29/2024 23:13   DG Chest 2 View Result Date: 07/29/2024 CLINICAL DATA:  Chest pain, squamous cell carcinoma of the lung EXAM: CHEST - 2 VIEW COMPARISON:  Chest radiograph July 22, 2024 CT angio chest July 09, 2024 FINDINGS: Subpleural right pulmonary nodule measuring 3.3 x 2.6 cm containing fiducial marker consistent with known malignancy. Left hilar spiculated mass measuring approximately 4.8 cm with perihilar fibrotic reticular opacities increased to prior radiograph and similar to prior CT chest. (Atelectatic/fibrotic changes of left upper lobe distal to mass may also correlate to prior radiation exposure or post obstruction.) No new pleural effusion. Cardiomediastinal silhouette is normal. No acute osseous abnormality. IMPRESSION: Subpleural right pulmonary nodule containing fiducial marker consistent with known malignancy, grossly stable to prior. Left hilar/perihilar spiculated mass and fibrotic strands similar to prior CT and are more conspicuous to prior chest radiograph. Findings  represent progressive disease versus fibrotic postradiation changes. PET-CT can be utilized for further assessment of viability. Electronically Signed   By: Megan  Zare M.D.   On: 07/29/2024 16:45    Pertinent labs & imaging results that were available during my care of the patient were reviewed by me and considered in my medical decision making (see MDM for details).  Medications Ordered in ED Medications  iohexol  (OMNIPAQUE ) 350 MG/ML injection 100 mL (100 mLs Intravenous Contrast Given 07/29/24 2149)                                                                                                                                     Procedures Procedures  (including critical care time)  Medical Decision Making / ED Course   MDM:  63 y/o male with hx lung cancer, aphasia presenting with chest and abdominal pain.  Patient has many frequent ER visits, although aphasic is able to communicate. Family  did not stay with patient.   Given history of recent PE diagnosis on eliquis , switched to lovenox . Has been getting lovenox  injections. CTA checked without signs of PE. Shows worsening lung mass discussed with patient. Labs otherwise reassuring with normal troponin. ECG without STEMI. Imaging without other concerning finding e.g. pneumonia, pneumothorax. Given abdominal pain obtained CT scan although not much tenderness on exam. CT abdomen without acute process. Shows chronic PAD and patient denies any extremity pain or complaints. Patient has been stable while in ER and not required any pain or nausea medicine. Feel patient is stable for discharge and outpatient follow up with his PMD and oncologist.       Additional history obtained:  -External records from outside source obtained and reviewed including: Chart review including previous notes, labs, imaging, consultation notes including numerous previous ER visits, recent  hospitalization for PE    Lab Tests: -I ordered, reviewed, and  interpreted labs.   The pertinent results include:   Labs Reviewed  CBC - Abnormal; Notable for the following components:      Result Value   Hemoglobin 12.9 (*)    Platelets 143 (*)    All other components within normal limits  BASIC METABOLIC PANEL WITH GFR  TROPONIN I (HIGH SENSITIVITY)  TROPONIN I (HIGH SENSITIVITY)    Notable for mild anemia, normal troponin  EKG   EKG Interpretation Date/Time:  Monday July 29 2024 15:19:17 EDT Ventricular Rate:  73 PR Interval:  170 QRS Duration:  97 QT Interval:  364 QTC Calculation: 402 R Axis:   66  Text Interpretation: Sinus rhythm Confirmed by Francesca Fallow ([id number]) on 07/29/2024 9:55:22 PM         Imaging Studies ordered: I ordered imaging studies including CT chest/abdomen On my interpretation imaging demonstrates no acute process I independently visualized and interpreted imaging. I agree with the radiologist interpretation   Medicines ordered and prescription drug management: Meds ordered this encounter  Medications   iohexol  (OMNIPAQUE ) 350 MG/ML injection 100 mL    -I have reviewed the patients home medicines and have made adjustments as needed  Cardiac Monitoring: The patient was maintained on a cardiac monitor.  I personally viewed and interpreted the cardiac monitored which showed an underlying rhythm of: NSR  Social Determinants of Health:  Diagnosis or treatment significantly limited by social determinants of health: aphasia    Co morbidities that complicate the patient evaluation  Past Medical History:  Diagnosis Date   Asthma    Atypical chest pain 08/13/2022   Community acquired pneumonia 09/14/2022   COPD (chronic obstructive pulmonary disease) (HCC)    Essential hypertension 08/19/2021   GERD (gastroesophageal reflux disease)    HAP (hospital-acquired pneumonia) 09/16/2022   History of tracheostomy    03/09/22-04/11/22   HLD (hyperlipidemia)    Hypertension    Hypokalemia 08/13/2022    Hypomagnesemia 08/13/2022   Lung cancer (HCC)    PAD (peripheral artery disease) (HCC)    Seizures (HCC) 06/02/2022   Sepsis (HCC) 08/13/2022   Sepsis due to pneumonia (HCC) 04/13/2023   Stroke (HCC) 02/2022      Dispostion: Disposition decision including need for hospitalization was considered, and patient discharged from emergency department.    Final Clinical Impression(s) / ED Diagnoses Final diagnoses:  Atypical chest pain  Abdominal pain, unspecified abdominal location     This chart was dictated using voice recognition software.  Despite best efforts to proofread,  errors can occur which can change the documentation meaning.    Francesca Fallow CROME, MD 07/30/24 1414

## 2024-07-29 NOTE — ED Triage Notes (Signed)
 Pt reports chest and abd pain

## 2024-07-29 NOTE — Discharge Instructions (Addendum)
 We evaluated you for your chest pain and abdominal pain.  Your testing in the emergency department is reassuring.  We did not see any signs of recurrent blood clots or other problems such as infection in your belly or blockage in your intestines.  Please follow-up with your oncologist and your primary doctor.  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  Please return if you have any new or worsening symptoms

## 2024-07-31 ENCOUNTER — Telehealth: Payer: Self-pay | Admitting: Internal Medicine

## 2024-07-31 NOTE — Telephone Encounter (Signed)
 Returned a voicemail left by the patients cousin. Scheduled labs and MD visit and confirmed appointment details.

## 2024-08-05 ENCOUNTER — Inpatient Hospital Stay (HOSPITAL_BASED_OUTPATIENT_CLINIC_OR_DEPARTMENT_OTHER): Admitting: Internal Medicine

## 2024-08-05 ENCOUNTER — Inpatient Hospital Stay: Attending: Internal Medicine

## 2024-08-05 VITALS — BP 123/72 | HR 88 | Temp 98.3°F | Resp 17 | Ht 73.0 in | Wt 193.0 lb

## 2024-08-05 DIAGNOSIS — R079 Chest pain, unspecified: Secondary | ICD-10-CM | POA: Diagnosis not present

## 2024-08-05 DIAGNOSIS — Z86718 Personal history of other venous thrombosis and embolism: Secondary | ICD-10-CM | POA: Diagnosis not present

## 2024-08-05 DIAGNOSIS — C3411 Malignant neoplasm of upper lobe, right bronchus or lung: Secondary | ICD-10-CM | POA: Insufficient documentation

## 2024-08-05 DIAGNOSIS — R059 Cough, unspecified: Secondary | ICD-10-CM | POA: Diagnosis not present

## 2024-08-05 DIAGNOSIS — Z9221 Personal history of antineoplastic chemotherapy: Secondary | ICD-10-CM | POA: Diagnosis not present

## 2024-08-05 DIAGNOSIS — J988 Other specified respiratory disorders: Secondary | ICD-10-CM | POA: Diagnosis not present

## 2024-08-05 DIAGNOSIS — C3490 Malignant neoplasm of unspecified part of unspecified bronchus or lung: Secondary | ICD-10-CM

## 2024-08-05 DIAGNOSIS — C3412 Malignant neoplasm of upper lobe, left bronchus or lung: Secondary | ICD-10-CM | POA: Diagnosis present

## 2024-08-05 DIAGNOSIS — Z79899 Other long term (current) drug therapy: Secondary | ICD-10-CM | POA: Insufficient documentation

## 2024-08-05 DIAGNOSIS — Z7901 Long term (current) use of anticoagulants: Secondary | ICD-10-CM | POA: Diagnosis not present

## 2024-08-05 DIAGNOSIS — Z923 Personal history of irradiation: Secondary | ICD-10-CM | POA: Insufficient documentation

## 2024-08-05 LAB — CBC WITH DIFFERENTIAL (CANCER CENTER ONLY)
Abs Immature Granulocytes: 0.02 K/uL (ref 0.00–0.07)
Basophils Absolute: 0 K/uL (ref 0.0–0.1)
Basophils Relative: 0 %
Eosinophils Absolute: 0.1 K/uL (ref 0.0–0.5)
Eosinophils Relative: 3 %
HCT: 39.5 % (ref 39.0–52.0)
Hemoglobin: 13.2 g/dL (ref 13.0–17.0)
Immature Granulocytes: 0 %
Lymphocytes Relative: 16 %
Lymphs Abs: 0.8 K/uL (ref 0.7–4.0)
MCH: 29.6 pg (ref 26.0–34.0)
MCHC: 33.4 g/dL (ref 30.0–36.0)
MCV: 88.6 fL (ref 80.0–100.0)
Monocytes Absolute: 0.4 K/uL (ref 0.1–1.0)
Monocytes Relative: 8 %
Neutro Abs: 3.4 K/uL (ref 1.7–7.7)
Neutrophils Relative %: 73 %
Platelet Count: 140 K/uL — ABNORMAL LOW (ref 150–400)
RBC: 4.46 MIL/uL (ref 4.22–5.81)
RDW: 13.9 % (ref 11.5–15.5)
WBC Count: 4.7 K/uL (ref 4.0–10.5)
nRBC: 0 % (ref 0.0–0.2)

## 2024-08-05 LAB — CMP (CANCER CENTER ONLY)
ALT: 38 U/L (ref 0–44)
AST: 32 U/L (ref 15–41)
Albumin: 4.3 g/dL (ref 3.5–5.0)
Alkaline Phosphatase: 100 U/L (ref 38–126)
Anion gap: 7 (ref 5–15)
BUN: 10 mg/dL (ref 8–23)
CO2: 32 mmol/L (ref 22–32)
Calcium: 9.3 mg/dL (ref 8.9–10.3)
Chloride: 103 mmol/L (ref 98–111)
Creatinine: 0.86 mg/dL (ref 0.61–1.24)
GFR, Estimated: >60 mL/min (ref >60)
Glucose, Bld: 106 mg/dL — ABNORMAL HIGH (ref 70–99)
Potassium: 3.4 mmol/L — ABNORMAL LOW (ref 3.5–5.1)
Sodium: 142 mmol/L (ref 135–145)
Total Bilirubin: 0.5 mg/dL (ref 0.0–1.2)
Total Protein: 7 g/dL (ref 6.5–8.1)

## 2024-08-05 NOTE — Progress Notes (Signed)
 [facility] [phone]   [phone]  OFFICE PROGRESS NOTE  [name], [name], DO [address] [place] [address]  DIAGNOSIS:  Stage IIb (T2 a, N1, M0) non-small cell lung cancer, squamous cell carcinoma presented with left upper lobe lung mass in addition to left hilar adenopathy diagnosed in June 2023.  The patient also has synchronous stage Ia (T1c, N0, M0) non-small cell lung cancer, squamous cell carcinoma presented with right upper lobe lung nodule diagnosed in June 2023. PD-L1 expression was 20%.  PRIOR THERAPY: Concurrent chemoradiation with weekly carboplatin  for AUC of 2 and paclitaxel  45 Mg/M2.  First dose 07/04/2022.  Status post post 5 cycles.  Last cycle was given August 01, 2022 with partial response.  The patient was lost to follow-up after that cycle.  CURRENT THERAPY: Observation.  INTERVAL HISTORY: Todd Estrada 63 y.o. male returns to the clinic today for follow-up visit accompanied by his nephew.Discussed the use of AI scribe software for clinical note transcription with the patient, who gave verbal consent to proceed.  History of Present Illness Todd Estrada is a 63 year old male with stage 2B non-small cell lung cancer who presents for evaluation of recent CT scan results. He is accompanied by his nephew.  He was diagnosed with stage 2B non-small cell lung cancer, specifically comasquamous cell carcinoma, in June 2023. The cancer involves a right upper lobe lung nodule, and he also had a synchronous stage 1A diagnosis at that time. He underwent concurrent chemoradiation treatment with weekly carboplatin  and paclitaxel  and has been under observation since then.  Recently, he had a CT scan of the chest, abdomen, and pelvis. The scan showed some density on the left side of the chest with inflammation.  He has a persistent cough and pain on the right side, particularly under the sternum area. He has been receiving  injections for a blood clot in his leg, which was identified by another doctor. There was confusion about whether he had a blood clot in the lung, but it was clarified that there is no pulmonary clot.  His follow-up schedule was disrupted, as he was supposed to return for an appointment three months after his last visit, but no appointment was set up. He had to call to inquire about the appointment, which was not on the books.    MEDICAL HISTORY: Past Medical History:  Diagnosis Date   Asthma    Atypical chest pain 08/13/2022   Community acquired pneumonia 09/14/2022   COPD (chronic obstructive pulmonary disease) (HCC)    Essential hypertension 08/19/2021   GERD (gastroesophageal reflux disease)    HAP (hospital-acquired pneumonia) 09/16/2022   History of tracheostomy    03/09/22-04/11/22   HLD (hyperlipidemia)    Hypertension    Hypokalemia 08/13/2022   Hypomagnesemia 08/13/2022   Lung cancer (HCC)    PAD (peripheral artery disease) (HCC)    Seizures (HCC) 06/02/2022   Sepsis (HCC) 08/13/2022   Sepsis due to pneumonia (HCC) 04/13/2023   Stroke (HCC) 02/2022    ALLERGIES:  has no known allergies.  MEDICATIONS:  Current Outpatient Medications  Medication Sig Dispense Refill   albuterol  (PROVENTIL ) (2.5 MG/3ML) 0.083% nebulizer solution Take 3 mLs (2.5 mg total) by nebulization every 4 (four) hours as needed for wheezing or shortness of breath. 90 mL 12   atorvastatin  (LIPITOR) 40 MG tablet Take 1 tablet (40 mg total) by mouth daily. 90 tablet 3   enoxaparin  (LOVENOX )  100 MG/ML injection Inject 0.85 mLs (85 mg total) into the skin every 12 (twelve) hours. 51 mL 2   levETIRAcetam  (KEPPRA ) 500 MG tablet Take 1 tablet (500 mg total) by mouth 2 (two) times daily. 180 tablet 0   lidocaine  (LIDODERM ) 5 % Place 1 patch onto the skin daily. Remove & Discard patch within 12 hours or as directed by MD 30 patch 0   Multiple Vitamin (MULTIVITAMIN WITH MINERALS) TABS tablet Take 1 tablet by  mouth daily.     nystatin  cream (MYCOSTATIN ) Apply to affected area 2 times daily 30 g 0   pantoprazole  (PROTONIX ) 40 MG tablet TAKE 1 TABLET(40 MG) BY MOUTH DAILY 90 tablet 2   sucralfate  (CARAFATE ) 1 g tablet Take 1 tablet (1 g total) by mouth 4 (four) times daily -  with meals and at bedtime. 60 tablet 1   Tiotropium Bromide-Olodaterol (STIOLTO RESPIMAT ) 2.5-2.5 MCG/ACT AERS NEW PRESCRIPTION REQUEST: STIOLTO 2.5 MCG- INHALE TWO PUFFS BY MOUTH DAILY (Patient taking differently: Take 2 puffs by mouth daily at 12 noon.) 12 g 3   No current facility-administered medications for this visit.    SURGICAL HISTORY:  Past Surgical History:  Procedure Laterality Date   BRONCHIAL BIOPSY  05/30/2022   Procedure: BRONCHIAL BIOPSIES;  Surgeon: Shelah Lamar RAMAN, MD;  Location: [facility] ENDOSCOPY;  Service: Pulmonary;;   BRONCHIAL BRUSHINGS  05/30/2022   Procedure: BRONCHIAL BRUSHINGS;  Surgeon: Shelah Lamar RAMAN, MD;  Location: [facility] ENDOSCOPY;  Service: Pulmonary;;   BRONCHIAL NEEDLE ASPIRATION BIOPSY  05/30/2022   Procedure: BRONCHIAL NEEDLE ASPIRATION BIOPSIES;  Surgeon: Shelah Lamar RAMAN, MD;  Location: [facility] ENDOSCOPY;  Service: Pulmonary;;   BRONCHIAL WASHINGS  05/30/2022   Procedure: BRONCHIAL WASHINGS;  Surgeon: Shelah Lamar RAMAN, MD;  Location: [facility] ENDOSCOPY;  Service: Pulmonary;;   CHOLECYSTECTOMY N/A 07/05/2023   Procedure: LAPAROSCOPIC CHOLECYSTECTOMY WITH INTRAOPERATIVE CHOLANGIOGRAM;  Surgeon: Sheldon Standing, MD;  Location: [facility] ORS;  Service: General;  Laterality: N/A;   ERCP N/A 07/06/2023   Procedure: ENDOSCOPIC RETROGRADE CHOLANGIOPANCREATOGRAPHY (ERCP);  Surgeon: Rosalie Kitchens, MD;  Location: [facility] ENDOSCOPY;  Service: Gastroenterology;  Laterality: N/A;   ESOPHAGOGASTRODUODENOSCOPY (EGD) WITH PROPOFOL  N/A 03/11/2022   Procedure: ESOPHAGOGASTRODUODENOSCOPY (EGD) WITH PROPOFOL ;  Surgeon: Sebastian Moles, MD;  Location: [facility] ENDOSCOPY;  Service: General;  Laterality: N/A;   FIDUCIAL MARKER PLACEMENT  05/30/2022    Procedure: FIDUCIAL MARKER PLACEMENT;  Surgeon: Shelah Lamar RAMAN, MD;  Location: [facility] ENDOSCOPY;  Service: Pulmonary;;   IR ANGIO INTRA EXTRACRAN SEL COM CAROTID INNOMINATE UNI R MOD SED  03/02/2022   IR CT HEAD LTD  03/02/2022   IR PERCUTANEOUS ART THROMBECTOMY/INFUSION INTRACRANIAL INC DIAG ANGIO  03/02/2022   PEG PLACEMENT N/A 03/11/2022   Procedure: PERCUTANEOUS ENDOSCOPIC GASTROSTOMY (PEG) PLACEMENT;  Surgeon: Sebastian Moles, MD;  Location: [facility] ENDOSCOPY;  Service: General;  Laterality: N/A;   RADIOLOGY WITH ANESTHESIA N/A 03/02/2022   Procedure: IR WITH ANESTHESIA;  Surgeon: Dolphus Carrion, MD;  Location: [facility] OR;  Service: Radiology;  Laterality: N/A;   REMOVAL OF STONES  07/06/2023   Procedure: REMOVAL OF STONES;  Surgeon: Rosalie Kitchens, MD;  Location: [facility] ENDOSCOPY;  Service: Gastroenterology;;   ANNETT  07/06/2023   Procedure: ANNETT;  Surgeon: Rosalie Kitchens, MD;  Location: [facility] ENDOSCOPY;  Service: Gastroenterology;;   VIDEO BRONCHOSCOPY WITH RADIAL ENDOBRONCHIAL ULTRASOUND  05/30/2022   Procedure: VIDEO BRONCHOSCOPY WITH RADIAL ENDOBRONCHIAL ULTRASOUND;  Surgeon: Shelah Lamar RAMAN, MD;  Location: [facility] ENDOSCOPY;  Service: Pulmonary;;    REVIEW OF SYSTEMS:  Constitutional: positive for fatigue  Eyes: negative Ears, nose, mouth, throat, and face: negative Respiratory: positive for cough and dyspnea on exertion Cardiovascular: negative Gastrointestinal: negative Genitourinary:negative Integument/breast: negative Hematologic/lymphatic: negative Musculoskeletal:positive for muscle weakness Neurological: negative Behavioral/Psych: negative Endocrine: negative Allergic/Immunologic: negative   PHYSICAL EXAMINATION: General appearance: alert, cooperative, fatigued, and no distress Head: Normocephalic, without obvious abnormality, atraumatic Neck: no adenopathy, no JVD, supple, symmetrical, trachea midline, and thyroid not enlarged, symmetric, no tenderness/mass/nodules Lymph nodes:  Cervical, supraclavicular, and axillary nodes normal. Resp: clear to auscultation bilaterally Back: symmetric, no curvature. ROM normal. No CVA tenderness. Cardio: regular rate and rhythm, S1, S2 normal, no murmur, click, rub or gallop GI: soft, non-tender; bowel sounds normal; no masses,  no organomegaly Extremities: extremities normal, atraumatic, no cyanosis or edema Neurologic: Alert and oriented X 3, normal strength and tone. Normal symmetric reflexes. Normal coordination and gait  ECOG PERFORMANCE STATUS: 1 - Symptomatic but completely ambulatory  Blood pressure 123/72, pulse 88, temperature 98.3 F (36.8 C), temperature source Temporal, resp. rate 17, height 6' 1 (1.854 m), weight 193 lb (87.5 kg), SpO2 97%.  LABORATORY DATA: Lab Results  Component Value Date   WBC 4.7 08/05/2024   HGB 13.2 08/05/2024   HCT 39.5 08/05/2024   MCV 88.6 08/05/2024   PLT 140 (L) 08/05/2024      Chemistry      Component Value Date/Time   NA 141 07/29/2024 1551   NA 138 07/05/2024 1132   K 3.7 07/29/2024 1551   CL 103 07/29/2024 1551   CO2 26 07/29/2024 1551   BUN 12 07/29/2024 1551   BUN 19 07/05/2024 1132   CREATININE 0.63 07/29/2024 1551   CREATININE 0.77 09/13/2023 1020      Component Value Date/Time   CALCIUM  9.5 07/29/2024 1551   ALKPHOS 89 07/13/2024 1354   AST 66 (H) 07/13/2024 1354   AST 15 09/13/2023 1020   ALT 105 (H) 07/13/2024 1354   ALT 12 09/13/2023 1020   BILITOT 0.6 07/13/2024 1354   BILITOT 0.4 09/13/2023 1020       RADIOGRAPHIC STUDIES: CT ABDOMEN PELVIS W CONTRAST Result Date: 07/29/2024 CLINICAL DATA:  High probability pulmonary embolism, chest pain, unspecified abdominal pain EXAM: CT ANGIOGRAPHY CHEST CT ABDOMEN AND PELVIS WITH CONTRAST TECHNIQUE: Multidetector CT imaging of the chest was performed using the standard protocol during bolus administration of intravenous contrast. Multiplanar CT image reconstructions and MIPs were obtained to evaluate the  vascular anatomy. Multidetector CT imaging of the abdomen and pelvis was performed using the standard protocol during bolus administration of intravenous contrast. RADIATION DOSE REDUCTION: This exam was performed according to the departmental dose-optimization program which includes automated exposure control, adjustment of the mA and/or kV according to patient size and/or use of iterative reconstruction technique. CONTRAST:  OMNIPAQUE  IOHEXOL  350 MG/ML SOLN COMPARISON:  07/09/2024, 07/12/2023 FINDINGS: CTA CHEST FINDINGS Cardiovascular: Adequate opacification of the pulmonary arterial tree. No intraluminal filling defect identified to suggest acute pulmonary embolism. Central pulmonary arteries are of normal caliber. Moderate multi-vessel coronary artery calcification. Cardiac size is within normal limits. No pericardial effusion. Mild atherosclerotic calcification within the thoracic aorta. No aortic aneurysm. Mediastinum/Nodes: No enlarged mediastinal, hilar, or axillary lymph nodes. Thyroid gland, trachea, and esophagus demonstrate no significant findings. Lungs/Pleura: Severe emphysema. Platelike scarring within mid lung zones bilaterally. Increasing masslike consolidation left upper lobe within the perihilar region with obliteration of the left upper lobar pulmonary artery within the anterior segment (63/12). This area of consolidation measures approximately 3.8 x 2.8 cm at image #  65/12. Stable peripheral chronic collapse and consolidation anterior segment of the left upper lobe. No confluent pulmonary infiltrate. Bronchial wall thickening noted in keeping with airway inflammation. No pneumothorax or pleural effusion Musculoskeletal: Remote right fifth rib fracture noted laterally. No acute bone abnormality. No lytic or blastic bone lesion. Review of the MIP images confirms the above findings. CT ABDOMEN and PELVIS FINDINGS Hepatobiliary: No focal liver abnormality is seen. Status post cholecystectomy.  No biliary dilatation. Pancreas: Unremarkable Spleen: Unremarkable Adrenals/Urinary Tract: The adrenal glands are unremarkable. The kidneys are normal in size and position. Vascular calcifications are seen within renal hila bilaterally. No definite urinary renal or ureteral calculi are identified. No hydronephrosis. No perinephric inflammatory stranding or fluid collections are seen. Bladder unremarkable. Stomach/Bowel: Stomach is within normal limits. Appendix appears normal. No evidence of bowel wall thickening, distention, or inflammatory changes. Vascular/Lymphatic: Advanced aortoiliac atherosclerotic calcification. 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 No pathologic adenopathy within the abdomen and pelvis. Reproductive: Prostate is unremarkable. Other: Tiny fat containing epigastric ventral hernia subcutaneous gas within the anterior abdominal wall within the epigastric region may relate to subcutaneous injection, but is nonspecific. Musculoskeletal: No acute or significant osseous findings. Review of the MIP images confirms the above findings. IMPRESSION: 1. No pulmonary embolism. 2. Moderate multi-vessel coronary artery calcification. 3. Severe emphysema. 4. Increasing masslike consolidation within the left upper lobe within the perihilar region with obliteration of the left upper lobar pulmonary artery within the anterior segment. This area of consolidation measures approximately 3.8 x 2.8 cm at image # 65/12. While this may represent an area of progressive postobstructive collapse, a developing neoplasm is not excluded. PET CT examination is recommended for further evaluation when the patient's acute issues have resolved. 5. No acute intra-abdominal pathology identified. 6. Advanced aortoiliac atherosclerotic calcification. 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 If there is evidence peripheral vascular insufficiency, CT arteriography may be helpful for further evaluation. Aortic Atherosclerosis (ICD10-I70.0) and Emphysema (ICD10-J43.9). Electronically Signed   By: Dorethia Molt M.D.   On: 07/29/2024 23:13   CT Angio Chest PE W and/or Wo Contrast Result Date: 07/29/2024 CLINICAL DATA:  High probability pulmonary embolism, chest pain, unspecified abdominal pain EXAM: CT ANGIOGRAPHY CHEST CT ABDOMEN AND PELVIS WITH CONTRAST TECHNIQUE: Multidetector CT imaging of the chest was performed using the standard protocol during bolus administration of intravenous contrast. Multiplanar CT image reconstructions and MIPs were obtained to evaluate the vascular anatomy. Multidetector CT imaging of the abdomen and pelvis was performed using the standard protocol during bolus administration of intravenous contrast. RADIATION DOSE REDUCTION: This exam was performed according to the departmental dose-optimization program which includes automated exposure control, adjustment of the mA and/or kV according to patient size and/or use of iterative reconstruction technique. CONTRAST:  OMNIPAQUE  IOHEXOL  350 MG/ML SOLN COMPARISON:  07/09/2024, 07/12/2023 FINDINGS: CTA CHEST FINDINGS Cardiovascular: Adequate opacification of the pulmonary arterial tree. No intraluminal filling defect identified to suggest acute pulmonary embolism. Central pulmonary arteries are of normal caliber. Moderate multi-vessel coronary artery calcification. Cardiac size is within normal limits. No pericardial effusion. Mild atherosclerotic calcification within the thoracic aorta. No aortic aneurysm. Mediastinum/Nodes: No enlarged mediastinal, hilar, or axillary lymph nodes. Thyroid gland, trachea, and esophagus demonstrate no significant findings. Lungs/Pleura: Severe emphysema. Platelike scarring within mid lung zones bilaterally. Increasing masslike consolidation left upper  lobe within the perihilar region with  obliteration of the left upper lobar pulmonary artery within the anterior segment (63/12). This area of consolidation measures approximately 3.8 x 2.8 cm at image # 65/12. Stable peripheral chronic collapse and consolidation anterior segment of the left upper lobe. No confluent pulmonary infiltrate. Bronchial wall thickening noted in keeping with airway inflammation. No pneumothorax or pleural effusion Musculoskeletal: Remote right fifth rib fracture noted laterally. No acute bone abnormality. No lytic or blastic bone lesion. Review of the MIP images confirms the above findings. CT ABDOMEN and PELVIS FINDINGS Hepatobiliary: No focal liver abnormality is seen. Status post cholecystectomy. No biliary dilatation. Pancreas: Unremarkable Spleen: Unremarkable Adrenals/Urinary Tract: The adrenal glands are unremarkable. The kidneys are normal in size and position. Vascular calcifications are seen within renal hila bilaterally. No definite urinary renal or ureteral calculi are identified. No hydronephrosis. No perinephric inflammatory stranding or fluid collections are seen. Bladder unremarkable. Stomach/Bowel: Stomach is within normal limits. Appendix appears normal. No evidence of bowel wall thickening, distention, or inflammatory changes. Vascular/Lymphatic: Advanced aortoiliac atherosclerotic calcification. 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 No pathologic adenopathy within the abdomen and pelvis. Reproductive: Prostate is unremarkable. Other: Tiny fat containing epigastric ventral hernia subcutaneous gas within the anterior abdominal wall within the epigastric region may relate to subcutaneous injection, but is nonspecific. Musculoskeletal: No acute or significant osseous findings. Review of the MIP images confirms the above findings. IMPRESSION: 1. No pulmonary embolism. 2. Moderate  multi-vessel coronary artery calcification. 3. Severe emphysema. 4. Increasing masslike consolidation within the left upper lobe within the perihilar region with obliteration of the left upper lobar pulmonary artery within the anterior segment. This area of consolidation measures approximately 3.8 x 2.8 cm at image # 65/12. While this may represent an area of progressive postobstructive collapse, a developing neoplasm is not excluded. PET CT examination is recommended for further evaluation when the patient's acute issues have resolved. 5. No acute intra-abdominal pathology identified. 6. Advanced aortoiliac atherosclerotic calcification. 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 If there is evidence peripheral vascular insufficiency, CT arteriography may be helpful for further evaluation. Aortic Atherosclerosis (ICD10-I70.0) and Emphysema (ICD10-J43.9). Electronically Signed   By: Dorethia Molt M.D.   On: 07/29/2024 23:13   DG Chest 2 View Result Date: 07/29/2024 CLINICAL DATA:  Chest pain, squamous cell carcinoma of the lung EXAM: CHEST - 2 VIEW COMPARISON:  Chest radiograph July 22, 2024 CT angio chest July 09, 2024 FINDINGS: Subpleural right pulmonary nodule measuring 3.3 x 2.6 cm containing fiducial marker consistent with known malignancy. Left hilar spiculated mass measuring approximately 4.8 cm with perihilar fibrotic reticular opacities increased to prior radiograph and similar to prior CT chest. (Atelectatic/fibrotic changes of left upper lobe distal to mass may also correlate to prior radiation exposure or post obstruction.) No new pleural effusion. Cardiomediastinal silhouette is normal. No acute osseous abnormality. IMPRESSION: Subpleural right pulmonary nodule containing fiducial marker consistent with known malignancy, grossly stable to prior. Left hilar/perihilar spiculated mass and fibrotic strands similar to  prior CT and are more conspicuous to prior chest radiograph. Findings represent progressive disease versus fibrotic postradiation changes. PET-CT can be utilized for further assessment of viability. Electronically Signed   By: Megan  Zare M.D.   On: 07/29/2024 16:45   CT Cervical Spine Wo Contrast Result Date: 07/22/2024 CLINICAL DATA:  Provided history: Neck trauma, dangerous injury mechanism (Age 30-64y) EXAM: CT CERVICAL  SPINE WITHOUT CONTRAST TECHNIQUE: Multidetector CT imaging of the cervical spine was performed without intravenous contrast. Multiplanar CT image reconstructions were also generated. RADIATION DOSE REDUCTION: This exam was performed according to the departmental dose-optimization program which includes automated exposure control, adjustment of the mA and/or kV according to patient size and/or use of iterative reconstruction technique. COMPARISON:  CT cervical spine 06/13/2024 FINDINGS: Alignment: Normal. Skull base and vertebrae: No acute fracture. Vertebral body heights are maintained. The dens and skull base are intact. Soft tissues and spinal canal: No prevertebral fluid or swelling. No visible canal hematoma. Disc levels: Bulky anterior osteophytes in the lower cervical spine. Stable multilevel disc space narrowing and disc bulging. Upper chest: Emphysema. Chronic calcified granuloma in the right lung apex. Persistent opacity in the perifissural left upper lobe without change from 07/09/2024 chest CT, likely attributable to scarring. No acute apical findings. Other: Carotid calcifications. IMPRESSION: 1. No acute fracture or subluxation of the cervical spine. 2. Degenerative change in the lower cervical spine. Electronically Signed   By: Andrea Gasman M.D.   On: 07/22/2024 15:45   CT Head Wo Contrast Result Date: 07/22/2024 CLINICAL DATA:  Provided history: Head trauma, abnormal mental status (Age 34-64y) Fall today.  Struck head. EXAM: CT HEAD WITHOUT CONTRAST TECHNIQUE: Contiguous  axial images were obtained from the base of the skull through the vertex without intravenous contrast. RADIATION DOSE REDUCTION: This exam was performed according to the departmental dose-optimization program which includes automated exposure control, adjustment of the mA and/or kV according to patient size and/or use of iterative reconstruction technique. COMPARISON:  CT 07/09/2024 FINDINGS: Brain: No acute intracranial hemorrhage. No subdural or extra-axial collection. Extensive encephalomalacia throughout the left cerebral hemisphere. Cortical calcifications in the left parietal lobe, unchanged. Left cerebellar encephalomalacia is unchanged. Periventricular and deep white matter hypodensity typical of chronic small vessel ischemia. Stable ex vacuo dilatation of the left lateral ventricle. No evidence of acute ischemia. Vascular: No acute findings. Skull: No fracture or focal lesion. Sinuses/Orbits: Chronic opacification of the right maxillary sinus. Mucosal thickening throughout ethmoid air cells and left maxillary sinus. Chronic fluid within the inferior mastoid air cells. No acute findings. Other: Scalp thickening in the posterior right vertex was present on prior exam and may represent scarring. IMPRESSION: 1. No acute intracranial abnormality. No skull fracture. 2. Stable remote infarcts and chronic small vessel ischemia. Electronically Signed   By: Andrea Gasman M.D.   On: 07/22/2024 15:40   DG Shoulder Right Result Date: 07/22/2024 CLINICAL DATA:  Fall, injury. EXAM: RIGHT SHOULDER - 2+ VIEW COMPARISON:  Chest radiograph 07/22/2024 and CT chest 07/09/2024. FINDINGS: Osteopenia. No definite fracture or dislocation. Moderate right acromioclavicular joint osteoarthritis. Fiducial marker and associated opacification in the lateral right chest. Scattered calcified granulomas. IMPRESSION: 1. No acute osseous or joint abnormality. 2. Moderate right acromioclavicular joint osteoarthritis. 3. Fiducial marker  and rounded in opacification in the lateral right chest, better evaluated on chest radiograph 07/13/2024 and CT chest 07/09/2024. Electronically Signed   By: Newell Eke M.D.   On: 07/22/2024 14:02   DG Chest Portable 1 View Result Date: 07/22/2024 EXAM: 1 VIEW XRAY OF THE CHEST 07/22/2024 01:45:00 PM COMPARISON: 07/13/2024 CLINICAL HISTORY: Fall. Fall, injury. FINDINGS: LUNGS AND PLEURA: Right upper lobe subpleural scarring with probable radiation fiducial, similar to prior. Diffuse interstitial thickening with apical emphysema. No focal pulmonary opacity. No pulmonary edema. No pleural effusion. No pneumothorax. HEART AND MEDIASTINUM: No acute abnormality of the cardiac and mediastinal silhouettes. BONES AND SOFT TISSUES:  No acute osseous abnormality. IMPRESSION: 1. No acute process. Electronically signed by: Rockey Kilts MD 07/22/2024 02:00 PM EDT RP Workstation: [id number]   DG Hip Unilat W or Wo Pelvis 2-3 Views Right Result Date: 07/17/2024 CLINICAL DATA:  Right hip pain without known injury. EXAM: DG HIP (WITH OR WITHOUT PELVIS) 2-3V RIGHT COMPARISON:  None Available. FINDINGS: There is no evidence of hip fracture or dislocation. There is no evidence of arthropathy or other focal bone abnormality. IMPRESSION: Negative. Electronically Signed   By: [name] Landy Raddle M.D.   On: 07/17/2024 20:28   DG Chest Port 1 View Result Date: 07/13/2024 CLINICAL DATA:  Chest and epigastric pain. EXAM: PORTABLE CHEST 1 VIEW COMPARISON:  06/30/2024 FINDINGS: The heart size and mediastinal contours are within normal limits. Pulmonary emphysema again noted. Bilateral pleural-parenchymal scarring shows no significant change. No acute infiltrate or pleural effusion. IMPRESSION: Emphysema and bilateral pleural-parenchymal scarring. No acute findings. Electronically Signed   By: Norleen DELENA Kil M.D.   On: 07/13/2024 13:49   CT Angio Chest PE W and/or Wo Contrast Result Date: 07/09/2024 CLINICAL DATA:  Concern for pulmonary  embolism.  Abdominal pain. EXAM: CT ANGIOGRAPHY CHEST CT ABDOMEN AND PELVIS WITH CONTRAST TECHNIQUE: Multidetector CT imaging of the chest was performed using the standard protocol during bolus administration of intravenous contrast. Multiplanar CT image reconstructions and MIPs were obtained to evaluate the vascular anatomy. Multidetector CT imaging of the abdomen and pelvis was performed using the standard protocol during bolus administration of intravenous contrast. RADIATION DOSE REDUCTION: This exam was performed according to the departmental dose-optimization program which includes automated exposure control, adjustment of the mA and/or kV according to patient size and/or use of iterative reconstruction technique. CONTRAST:  OMNIPAQUE  IOHEXOL  350 MG/ML SOLN COMPARISON:  Chest CT dated 06/30/2024. CT abdomen pelvis dated 06/15/2024. FINDINGS: Evaluation of this exam is limited due to respiratory motion. CTA CHEST FINDINGS Cardiovascular: There is no cardiomegaly or pericardial effusion. Three-vessel coronary vascular calcification. Mild atherosclerotic calcification of the thoracic aorta. No aneurysm dilatation or dissection. Evaluation of the pulmonary arteries is limited due to respiratory motion. No pulmonary artery embolus identified. Mediastinum/Nodes: No obvious hilar or mediastinal adenopathy. The esophagus is grossly unremarkable. No mediastinal fluid collection. Lungs/Pleura: Background of emphysema. Similar appearance of left perihilar and right upper lobe subpleural densities, likely scarring or post treatment fibrosis. No new consolidation. There is no pleural effusion or pneumothorax. The central airways are patent. Musculoskeletal: No acute osseous pathology. Degenerative changes of the spine. Review of the MIP images confirms the above findings. CT ABDOMEN and PELVIS FINDINGS No intra-abdominal free air or free fluid. Hepatobiliary: The liver is unremarkable. There is mild biliary  dilatation, post cholecystectomy. No retained calcified stone noted in the central CBD. Pancreas: Unremarkable. No pancreatic ductal dilatation or surrounding inflammatory changes. Spleen: Normal in size without focal abnormality. Adrenals/Urinary Tract: The adrenal glands are unremarkable. Bilateral renal vascular calcification noted. There is no hydronephrosis on either side. There is symmetric enhancement and excretion of contrast by both kidneys. The visualized ureters and urinary bladder appear unremarkable. Stomach/Bowel: There is no bowel obstruction or active inflammation. The appendix is normal. Vascular/Lymphatic: Advanced aortoiliac atherosclerotic disease. The IVC is unremarkable. No portal venous gas. There is no adenopathy. Reproductive: The prostate and seminal vesicles are grossly unremarkable. No pelvic mass. Other: None Musculoskeletal: Osteopenia with degenerative changes. No acute osseous pathology. Review of the MIP images confirms the above findings. IMPRESSION: 1. No acute intrathoracic, abdominal, or pelvic pathology. No CT evidence  of pulmonary artery embolus. 2. Similar appearance of left perihilar and right upper lobe subpleural densities, likely scarring or post treatment fibrosis. 3. Aortic Atherosclerosis (ICD10-I70.0) and Emphysema (ICD10-J43.9). Electronically Signed   By: Vanetta Chou M.D.   On: 07/09/2024 13:50   CT ABDOMEN PELVIS W CONTRAST Result Date: 07/09/2024 CLINICAL DATA:  Concern for pulmonary embolism.  Abdominal pain. EXAM: CT ANGIOGRAPHY CHEST CT ABDOMEN AND PELVIS WITH CONTRAST TECHNIQUE: Multidetector CT imaging of the chest was performed using the standard protocol during bolus administration of intravenous contrast. Multiplanar CT image reconstructions and MIPs were obtained to evaluate the vascular anatomy. Multidetector CT imaging of the abdomen and pelvis was performed using the standard protocol during bolus administration of intravenous contrast.  RADIATION DOSE REDUCTION: This exam was performed according to the departmental dose-optimization program which includes automated exposure control, adjustment of the mA and/or kV according to patient size and/or use of iterative reconstruction technique. CONTRAST:  OMNIPAQUE  IOHEXOL  350 MG/ML SOLN COMPARISON:  Chest CT dated 06/30/2024. CT abdomen pelvis dated 06/15/2024. FINDINGS: Evaluation of this exam is limited due to respiratory motion. CTA CHEST FINDINGS Cardiovascular: There is no cardiomegaly or pericardial effusion. Three-vessel coronary vascular calcification. Mild atherosclerotic calcification of the thoracic aorta. No aneurysm dilatation or dissection. Evaluation of the pulmonary arteries is limited due to respiratory motion. No pulmonary artery embolus identified. Mediastinum/Nodes: No obvious hilar or mediastinal adenopathy. The esophagus is grossly unremarkable. No mediastinal fluid collection. Lungs/Pleura: Background of emphysema. Similar appearance of left perihilar and right upper lobe subpleural densities, likely scarring or post treatment fibrosis. No new consolidation. There is no pleural effusion or pneumothorax. The central airways are patent. Musculoskeletal: No acute osseous pathology. Degenerative changes of the spine. Review of the MIP images confirms the above findings. CT ABDOMEN and PELVIS FINDINGS No intra-abdominal free air or free fluid. Hepatobiliary: The liver is unremarkable. There is mild biliary dilatation, post cholecystectomy. No retained calcified stone noted in the central CBD. Pancreas: Unremarkable. No pancreatic ductal dilatation or surrounding inflammatory changes. Spleen: Normal in size without focal abnormality. Adrenals/Urinary Tract: The adrenal glands are unremarkable. Bilateral renal vascular calcification noted. There is no hydronephrosis on either side. There is symmetric enhancement and excretion of contrast by both kidneys. The visualized ureters and  urinary bladder appear unremarkable. Stomach/Bowel: There is no bowel obstruction or active inflammation. The appendix is normal. Vascular/Lymphatic: Advanced aortoiliac atherosclerotic disease. The IVC is unremarkable. No portal venous gas. There is no adenopathy. Reproductive: The prostate and seminal vesicles are grossly unremarkable. No pelvic mass. Other: None Musculoskeletal: Osteopenia with degenerative changes. No acute osseous pathology. Review of the MIP images confirms the above findings. IMPRESSION: 1. No acute intrathoracic, abdominal, or pelvic pathology. No CT evidence of pulmonary artery embolus. 2. Similar appearance of left perihilar and right upper lobe subpleural densities, likely scarring or post treatment fibrosis. 3. Aortic Atherosclerosis (ICD10-I70.0) and Emphysema (ICD10-J43.9). Electronically Signed   By: Vanetta Chou M.D.   On: 07/09/2024 13:50   CT HEAD WO CONTRAST ( ) Result Date: 07/09/2024 CLINICAL DATA:  Mental status change, unknown cause c/o upper ABD pain/ incomprehensible speech at baseline/ family member dropped pt off/ pt possibly getting abd injections for PE EXAM: CT HEAD WITHOUT CONTRAST TECHNIQUE: Contiguous axial images were obtained from the base of the skull through the vertex without intravenous contrast. RADIATION DOSE REDUCTION: This exam was performed according to the departmental dose-optimization program which includes automated exposure control, adjustment of the mA and/or kV according to patient size and/or  use of iterative reconstruction technique. COMPARISON:  CT head 06/13/2024 FINDINGS: Brain: Similar-appearing marked left cerebral encephalomalacia with associated calcifications. Similar-appearing left cerebellar encephalomalacia. No evidence of large-territorial acute infarction. No parenchymal hemorrhage. No mass lesion. No extra-axial collection. No mass effect or midline shift. No hydrocephalus. Basilar cisterns are patent. Vascular: No  hyperdense vessel. Atherosclerotic calcifications are present within the cavernous internal carotid and vertebral arteries. Skull: No acute fracture or focal lesion. Sinuses/Orbits: Right maxillary sinus mucosal thickening. Otherwise paranasal sinuses and mastoid air cells are clear. The orbits are unremarkable. Other: None. IMPRESSION: No acute intracranial abnormality. Electronically Signed   By: Morgane  Naveau M.D.   On: 07/09/2024 13:36    ASSESSMENT AND PLAN: This is a very pleasant 63 years old white male with  stage IIb (T2 a, N1, M0) non-small cell lung cancer, squamous cell carcinoma presented with left upper lobe lung mass in addition to left hilar adenopathy diagnosed in June 2023.  The patient also has synchronous stage Ia (T1c, N0, M0) non-small cell lung cancer, squamous cell carcinoma presented with right upper lobe lung nodule diagnosed in June 2023. PD-L1 expression was 20%. The patient underwent a course of concurrent chemoradiation with weekly carboplatin  for AUC of 2 and paclitaxel  45 Mg/M2 status post 5 cycles. Last dose was giving August 01 2022. He has been tolerating this treatment well with no concerning adverse effects. Unfortunately his scan showed progressive disease with pleural-based right lung mass and questionable chest wall invasion. He received palliative radiotherapy to this area.  Is currently on observation. He had repeat CT scan of the chest, abdomen and pelvis performed recently.  I personally and independently reviewed the scan images and discussed the results with the patient and his nephew. Assessment and Plan Assessment & Plan Stage 2B non-small cell lung cancer, right upper lobe, under surveillance Stage 2B non-small cell lung cancer located in the right upper lobe, previously treated with concurrent chemoradiation with carboplatin  and paclitaxel . Currently under surveillance. Recent CT scan showed no blood clots but revealed inflammation on the left side of  the chest. Unclear if there is any tumor behind the inflammation. - Order follow-up scan in three months to assess cancer status - Ensure appointment is scheduled before leaving the clinic - Advise to contact nurse directly if appointment issues arise  Cough and right-sided chest pain Persistent cough and right-sided chest pain, possibly related to previous cancer treatment or current inflammation. Pain is located under the sternum and on the right side.  Inflammation in left chest, under evaluation Inflammation noted on the left side of the chest on recent CT scan. Unclear if there is any tumor behind the inflammation. - Monitor inflammation and reassess with imaging if necessary  History of deep vein thrombosis of lower extremity, on anticoagulation Deep vein thrombosis in the lower extremity, currently on anticoagulation therapy. No evidence of pulmonary embolism on recent CT scan. He was advised to call immediately if he has any concerning symptoms in the interval.  The patient voices understanding of current disease status and treatment options and is in agreement with the current care plan.  All questions were answered. The patient knows to call the clinic with any problems, questions or concerns. We can certainly see the patient much sooner if necessary.  The total time spent in the appointment was 30 minutes.  Disclaimer: This note was dictated with voice recognition software. Similar sounding words can inadvertently be transcribed and may not be corrected upon review.

## 2024-08-14 ENCOUNTER — Ambulatory Visit: Payer: Self-pay

## 2024-08-14 VITALS — BP 137/77 | HR 87 | Temp 98.1°F | Ht 73.0 in | Wt 193.0 lb

## 2024-08-14 DIAGNOSIS — R0789 Other chest pain: Secondary | ICD-10-CM | POA: Diagnosis not present

## 2024-08-14 DIAGNOSIS — K219 Gastro-esophageal reflux disease without esophagitis: Secondary | ICD-10-CM

## 2024-08-14 DIAGNOSIS — J432 Centrilobular emphysema: Secondary | ICD-10-CM | POA: Diagnosis present

## 2024-08-14 DIAGNOSIS — E785 Hyperlipidemia, unspecified: Secondary | ICD-10-CM | POA: Diagnosis not present

## 2024-08-14 DIAGNOSIS — Z86711 Personal history of pulmonary embolism: Secondary | ICD-10-CM

## 2024-08-14 MED ORDER — ENOXAPARIN SODIUM 100 MG/ML IJ SOSY: 1.0000 mg/kg | PREFILLED_SYRINGE | Freq: Two times a day (BID) | INTRAMUSCULAR | 2 refills | Status: DC

## 2024-08-14 MED ORDER — ATORVASTATIN CALCIUM 40 MG PO TABS: 40.0000 mg | ORAL_TABLET | Freq: Every day | ORAL | 3 refills | Status: AC

## 2024-08-14 MED ORDER — STIOLTO RESPIMAT 2.5-2.5 MCG/ACT IN AERS: INHALATION_SPRAY | RESPIRATORY_TRACT | 3 refills | Status: AC

## 2024-08-14 MED ORDER — SUCRALFATE 1 G PO TABS: 1.0000 g | ORAL_TABLET | Freq: Three times a day (TID) | ORAL | 1 refills | Status: DC

## 2024-08-14 MED ORDER — LIDOCAINE 5 % EX PTCH: 1.0000 | MEDICATED_PATCH | CUTANEOUS | 0 refills | Status: DC

## 2024-08-14 NOTE — Assessment & Plan Note (Addendum)
 Stable, not exacerbated.  ED did CT chest, abdomen and pelvis, which showed no signs of pneumothorax, pneumonia, or new pulmonary embolism. Patient requesting medication refill to be sent to pharmacy.  - Continue Stiolto Respimat  and budesonide  nebulizer twice daily.

## 2024-08-14 NOTE — Progress Notes (Signed)
 Established Patient Office Visit  Subjective   Patient ID: Todd Estrada, male    DOB: Sep 07, 1961  Age: 63 y.o. MRN: [id number]  Chief Complaint  Patient presents with   Medication Refill   Hospitalization Follow-up    63 year old male with a past medical history of stage IV NSCLC, squamous cell carcinoma on left upper lobe and left hilar adenopathy, heart failure with preserved ejection fraction, paroxysmal A-fib on Eliquis , prior stroke with aphasia and right hemiplegia, seizure disorder, peripheral artery disease, COPD, and a recent PE to left lower lobe in July 2025 who is presenting today after his recent emergency department visit.  He was seen in the emergency department on 8/11 for new chest and belly pain.  He had no new signs of PE and CT imaging was negative for other concerns such as pneumonia, pneumothorax.  EKG was negative for STEMI.  He is presenting today to follow-up.  He has recently seen his oncologist on 8/18 who recommends he does a follow-up scan in 3 months to assess his cancer status.  Oncology believes that persistent right sided chest pain might be related to previous cancer treatment or current inflammation.  Patient's caregiver present today during exam, who states that his chest pain is not getting any better, but is not improving either.  The Carafate  that was prescribed in the ED has been helping patient's reflux and states that this is helping his pain as well.  Patient is adherent to all of his medications and is requesting medication refills.  He has his cardiology appointment on 9/10 and vascular surgery appointment on 9/4 and plans on following up with these offices.    Review of Systems  Constitutional:  Negative for chills and fever.  Respiratory:  Negative for cough and shortness of breath.   Cardiovascular:  Positive for chest pain. Negative for palpitations.       Musculoskeletal in nature  Gastrointestinal:  Positive for heartburn. Negative for  abdominal pain, nausea and vomiting.  Skin:  Negative for itching and rash.  Neurological:  Negative for dizziness, tingling, tremors and headaches.      Objective:     BP 137/77 (BP Location: Right Arm, Patient Position: Sitting, Cuff Size: Normal)   Pulse 87   Temp 98.1 F (36.7 C) (Oral)   Ht 6' 1 (1.854 m)   Wt 193 lb (87.5 kg)   SpO2 92%   BMI 25.46 kg/m   Physical Exam Constitutional:      General: He is not in acute distress.    Appearance: Normal appearance.  HENT:     Head: Normocephalic and atraumatic.     Comments: 2cm x 1cm lipoma on the right parietal skull. Flesh colored, does not elicit pain on exam Cardiovascular:     Rate and Rhythm: Normal rate and regular rhythm.     Pulses: Normal pulses.     Heart sounds: Normal heart sounds.  Pulmonary:     Effort: Pulmonary effort is normal. No respiratory distress.     Breath sounds: Normal breath sounds. No wheezing.  Abdominal:     General: Abdomen is flat. Bowel sounds are normal.     Palpations: Abdomen is soft.  Musculoskeletal:     Cervical back: Normal range of motion.     Comments: Pain to palpation on the right sternum.  2cm x 1cm lipoma on the right flank. Flesh colored, does not elicit pain on exam   Skin:    General: Skin is warm  and dry.  Neurological:     General: No focal deficit present.     Mental Status: He is alert and oriented to person, place, and time.  Psychiatric:        Mood and Affect: Mood normal.        Behavior: Behavior normal.      Assessment & Plan:   Patient seen with Dr. Ronnald Sergeant.   History of Pulmonary Embolism Patient hospitalized in July 2025 for nonocclusive segmental PE in the left lower lobe and age-indeterminate DVT of left formol pain.  Previously was taking Eliquis  for paroxysmal A-fib.  Has been adherent to Lovenox  injections twice daily, and is requesting refill to be sent to pharmacy.  Lung exam clear today, and breathing comfortably no acute distress.   Patient will be following with cardiology on September 10th and vascular surgery September 4. Patient seen 8/11 in hospital for new chest pain running for potential recurrent pulmonary embolism.  After further imaging review, patient did not have an additional embolism.  Recommended continued Lovenox  injections twice daily.  Will resend prescription to pharmacy.  - Continue Lovenox  0.85mg  injections twice daily  Emphysema lung Stable, not exacerbated.  ED did CT chest, abdomen and pelvis, which showed no signs of pneumothorax, pneumonia, or new pulmonary embolism. Patient requesting medication refill to be sent to pharmacy.  - Continue Stiolto Respimat  and budesonide  nebulizer twice daily.   GERD Patient's caregiver reports patient has recently stopped Protonix  twice daily, and was switched to Sucralfate  1g up to 3 times daily with meals.  He reports that patient has only been taking it twice a day, but this has also been helping with his reflux and with his reflux and believe it to be helping with his chest pain as well.   -Continue Carafate  1g times daily   Musculoskeletal chest pain Patient is reporting chest pain that is located to the right side of the sternum.  It is reproducible on palpation.  Recent ED visit was able to rule out any signs of recurrent PE, pneumothorax, pneumonia that could be contributory to his pain via CT imaging.  His oncologist believes his pain to be related to previous cancer treatment or current inflammation.  Oncology is on board and monitoring inflammation.  Patient is reporting the pain is not worsening but is not improving.  Has not tried anything to improve the pain.  Recommended Tylenol  500 mg twice daily.  Recommended that patient can take it for his pain on an as-needed basis every 6 hours, not to exceed more than 3 g in 1 day.  Will also prescribe lidocaine  patch. - Will send lidocaine  5% patch to patient pharmacy - Tylenol  500mg  twice daily every 6 hours as  needed  Hyperlipidemia Chronic, stable.  Patient is requesting refill for atorvastatin .  Following up with cardiology 9/10 for new found A-fib.  Patient tolerating statin well - Continue atorvastatin  40 mg   Return in about 3 months (around 11/14/2024) for follow up of chronic conditions.   Ellarae Nevitt, DO Internal Medicine Resident, PGY-1 [facility] 3:00 PM 08/14/2024

## 2024-08-14 NOTE — Patient Instructions (Addendum)
 Thank you, [name] for allowing us  to provide your care today. Today we discussed your previous hospitalization and refilling your medicines.  Refilled medications: -Atorvastatin  40mg , once daily -Stiolto inhaler, 2 puffs daily -Lovenox  injections twice daily -Sucralfate  1gm, twice daily -Lidocaine  5% patch for pain   My Chart Access: [email or web address]  Please follow-up in: 3 months for chronic conditions    We look forward to seeing you next time. Please call our clinic at [phone] if you have any questions or concerns. The best time to call is Monday-Friday from 9am-4pm, but there is someone available 24/7. If after hours or the weekend, call the main hospital number and ask for the Internal Medicine Resident On-Call. If you need medication refills, please notify your pharmacy one week in advance and they will send us  a request.   Thank you for letting us  take part in your care. Wishing you the best!  Todd Nunziata, DO 08/14/2024, 10:55 AM [facility]

## 2024-08-14 NOTE — Assessment & Plan Note (Addendum)
 Patient hospitalized in July 2025 for nonocclusive segmental PE in the left lower lobe and age-indeterminate DVT of left formol pain.  Previously was taking Eliquis  for paroxysmal A-fib.  Has been adherent to Lovenox  injections twice daily, and is requesting refill to be sent to pharmacy.  Lung exam clear today, and breathing comfortably no acute distress.  Patient will be following with cardiology on September 10th and vascular surgery September 4. Patient seen 8/11 in hospital for new chest pain running for potential recurrent pulmonary embolism.  After further imaging review, patient did not have an additional embolism.  Recommended continued Lovenox  injections twice daily.  Will resend prescription to pharmacy.  -Continue Lovenox  0.85mg  injections twice daily

## 2024-08-20 NOTE — Progress Notes (Signed)
 Internal Medicine Clinic Attending  I was physically present during the key portions of the resident provided service and participated in the medical decision making of patient's management care. I reviewed pertinent patient test results.  The assessment, diagnosis, and plan were formulated together and I agree with the documentation in the resident's note.  Dickie La, MD

## 2024-08-20 NOTE — Addendum Note (Signed)
 Addended by: KARNA FELLOWS on: 08/20/2024 09:02 AM   Modules accepted: Level of Service

## 2024-08-22 ENCOUNTER — Ambulatory Visit (HOSPITAL_COMMUNITY)
Admission: RE | Admit: 2024-08-22 | Discharge: 2024-08-22 | Disposition: A | Discharge status: Home / Self Care | Source: Ambulatory Visit | Attending: Vascular Surgery | Admitting: Vascular Surgery

## 2024-08-22 ENCOUNTER — Ambulatory Visit: Attending: Vascular Surgery | Admitting: Vascular Surgery

## 2024-08-22 DIAGNOSIS — I739 Peripheral vascular disease, unspecified: Secondary | ICD-10-CM

## 2024-08-22 DIAGNOSIS — M7989 Other specified soft tissue disorders: Secondary | ICD-10-CM

## 2024-08-22 DIAGNOSIS — R600 Localized edema: Secondary | ICD-10-CM

## 2024-08-23 ENCOUNTER — Emergency Department (HOSPITAL_COMMUNITY)
Admission: EM | Admit: 2024-08-23 | Discharge: 2024-08-23 | Disposition: A | Discharge status: Home / Self Care | Attending: Emergency Medicine | Admitting: Emergency Medicine

## 2024-08-23 ENCOUNTER — Encounter (HOSPITAL_COMMUNITY): Payer: Self-pay

## 2024-08-23 ENCOUNTER — Emergency Department (HOSPITAL_COMMUNITY)

## 2024-08-23 DIAGNOSIS — Z85118 Personal history of other malignant neoplasm of bronchus and lung: Secondary | ICD-10-CM | POA: Insufficient documentation

## 2024-08-23 DIAGNOSIS — M533 Sacrococcygeal disorders, not elsewhere classified: Secondary | ICD-10-CM | POA: Diagnosis not present

## 2024-08-23 DIAGNOSIS — R079 Chest pain, unspecified: Secondary | ICD-10-CM | POA: Insufficient documentation

## 2024-08-23 DIAGNOSIS — J449 Chronic obstructive pulmonary disease, unspecified: Secondary | ICD-10-CM | POA: Insufficient documentation

## 2024-08-23 DIAGNOSIS — Z8673 Personal history of transient ischemic attack (TIA), and cerebral infarction without residual deficits: Secondary | ICD-10-CM | POA: Insufficient documentation

## 2024-08-23 DIAGNOSIS — M545 Low back pain, unspecified: Secondary | ICD-10-CM | POA: Diagnosis present

## 2024-08-23 MED ORDER — ACETAMINOPHEN 500 MG PO TABS
1000.0000 mg | ORAL_TABLET | Freq: Once | ORAL | Status: AC
Start: 2024-08-23 — End: 2024-08-23
  Administered 2024-08-23: 1000 mg via ORAL

## 2024-08-23 NOTE — ED Provider Notes (Signed)
 [place] EMERGENCY DEPARTMENT AT [facility] Provider Note   CSN: [id number] Arrival date & time: 08/23/24  1123     Patient presents with: Tailbone Pain   Todd Estrada is a 63 y.o. male.   Todd Estrada is a 63 y.o. male history of COPD, lung cancer, prior stroke, GERD, aphasia, pulmonary embolism on Lovenox  presenting with chest pain, belly pain.  History is somewhat limited due to aphasia and the patient has no family with him but he is able to answer questions with yes or no.  He reports pain over his buttocks.  Denies any fall.  States that this started yesterday.  No head injury.  The history is provided by the patient. No language interpreter was used.       Prior to Admission medications   Medication Sig Start Date End Date Taking? Authorizing Provider  albuterol  (PROVENTIL ) (2.5 MG/3ML) 0.083% nebulizer solution Take 3 mLs (2.5 mg total) by nebulization every 4 (four) hours as needed for wheezing or shortness of breath. 08/16/22   Lou Claretta HERO, MD  atorvastatin  (LIPITOR) 40 MG tablet Take 1 tablet (40 mg total) by mouth daily. 08/14/24 08/14/25  [name], [name], DO  enoxaparin  (LOVENOX ) 100 MG/ML injection Inject 0.85 mLs (85 mg total) into the skin every 12 (twelve) hours. 08/14/24   [name], [name], DO  levETIRAcetam  (KEPPRA ) 500 MG tablet Take 1 tablet (500 mg total) by mouth 2 (two) times daily. Patient not taking: Reported on 08/05/2024 04/18/24   Whitfield Raisin, NP  lidocaine  (LIDODERM ) 5 % Place 1 patch onto the skin daily. Remove & Discard patch within 12 hours or as directed by MD 08/14/24   [name], [name], DO  Multiple Vitamin (MULTIVITAMIN WITH MINERALS) TABS tablet Take 1 tablet by mouth daily.    [provider]  nystatin  cream (MYCOSTATIN ) Apply to affected area 2 times daily Patient not taking: Reported on 08/05/2024 07/18/24   Logan Ubaldo NOVAK, PA-C  pantoprazole  (PROTONIX ) 40 MG tablet TAKE 1 TABLET(40 MG) BY MOUTH DAILY 07/08/24    Tawkaliyar, Roya, DO  sucralfate  (CARAFATE ) 1 g tablet Take 1 tablet (1 g total) by mouth 4 (four) times daily -  with meals and at bedtime. 08/14/24   [name], [name], DO  Tiotropium Bromide-Olodaterol (STIOLTO RESPIMAT ) 2.5-2.5 MCG/ACT AERS NEW PRESCRIPTION REQUEST: STIOLTO 2.5 MCG- INHALE TWO PUFFS BY MOUTH DAILY 08/14/24   [name], [name], DO    Allergies: Patient has no known allergies.    Review of Systems  Unable to perform ROS: Other (aphasia)    Updated Vital Signs BP 120/80   Pulse 100   Temp 98.8 F (37.1 C) (Oral)   Resp 18   SpO2 90%   Physical Exam Vitals and nursing note reviewed.  Constitutional:      General: He is not in acute distress.    Appearance: Normal appearance. He is not ill-appearing.  HENT:     Head: Normocephalic and atraumatic.     Nose: Nose normal.  Eyes:     Conjunctiva/sclera: Conjunctivae normal.  Cardiovascular:     Rate and Rhythm: Normal rate and regular rhythm.  Pulmonary:     Effort: Pulmonary effort is normal. No respiratory distress.  Musculoskeletal:        General: No deformity. Normal range of motion.     Cervical back: Normal range of motion.  Skin:    Findings: No rash.     Comments: Nurse tech present as chaperone.  Skin exam performed over his  low back and buttocks area.  No bruising, pressure sore or other acute finding identified.  No deformity or step-offs. Spine well aligned.  No tenderness palpation of C, T, or L-spine.  Neurological:     Mental Status: He is alert.     (all labs ordered are listed, but only abnormal results are displayed) Labs Reviewed - No data to display  EKG: None  Radiology: No results found.   Procedures   Medications Ordered in the ED - No data to display                                  Medical Decision Making Amount and/or Complexity of Data Reviewed Radiology: ordered.  Risk OTC drugs.   63 year old male presents today for concern of tailbone pain.  He states this  started yesterday.  Denies any head injury. X-ray obtained.  No acute bony abnormality. Skin exam performed.  No evidence of pressure sore, bruising or other acute findings. Patient is stable for discharge.  Supportive care discussed.  Discussed follow-up with PCP. Patient discharged in stable condition.   Final diagnoses:  Coccyx pain    ED Discharge Orders     None          Hildegard Loge, PA-C 08/23/24 1453    Ula Prentice SAUNDERS, MD 08/23/24 [phone]

## 2024-08-23 NOTE — Discharge Instructions (Signed)
 X-ray was normal.  No concerning findings.  Follow-up with the primary care doctor.  Return for any concerning symptoms.  Take Tylenol  for pain control.

## 2024-08-23 NOTE — ED Triage Notes (Signed)
 Pt c/o buttocks pain for the past few weeks, speech impediment and unable to accurately assess what pt is trying to say. Pt is pointing to his left butt cheek. Will assess further when in patient exam room.

## 2024-08-28 ENCOUNTER — Ambulatory Visit: Admitting: Internal Medicine

## 2024-09-10 ENCOUNTER — Emergency Department (HOSPITAL_COMMUNITY)
Admission: EM | Admit: 2024-09-10 | Discharge: 2024-09-11 | Disposition: A | Discharge status: Home / Self Care | Attending: Emergency Medicine | Admitting: Emergency Medicine

## 2024-09-10 ENCOUNTER — Encounter (HOSPITAL_COMMUNITY): Payer: Self-pay

## 2024-09-10 ENCOUNTER — Other Ambulatory Visit: Payer: Self-pay

## 2024-09-10 ENCOUNTER — Emergency Department (HOSPITAL_COMMUNITY)

## 2024-09-10 DIAGNOSIS — R109 Unspecified abdominal pain: Secondary | ICD-10-CM

## 2024-09-10 DIAGNOSIS — I10 Essential (primary) hypertension: Secondary | ICD-10-CM | POA: Diagnosis not present

## 2024-09-10 DIAGNOSIS — J449 Chronic obstructive pulmonary disease, unspecified: Secondary | ICD-10-CM | POA: Diagnosis not present

## 2024-09-10 DIAGNOSIS — R7989 Other specified abnormal findings of blood chemistry: Secondary | ICD-10-CM | POA: Insufficient documentation

## 2024-09-10 DIAGNOSIS — R1032 Left lower quadrant pain: Secondary | ICD-10-CM | POA: Insufficient documentation

## 2024-09-10 DIAGNOSIS — Z85118 Personal history of other malignant neoplasm of bronchus and lung: Secondary | ICD-10-CM | POA: Diagnosis not present

## 2024-09-10 DIAGNOSIS — R1031 Right lower quadrant pain: Secondary | ICD-10-CM | POA: Insufficient documentation

## 2024-09-10 DIAGNOSIS — R1030 Lower abdominal pain, unspecified: Secondary | ICD-10-CM | POA: Diagnosis present

## 2024-09-10 LAB — LIPASE, BLOOD: Lipase: 19 U/L (ref 11–51)

## 2024-09-10 LAB — CBC WITH DIFFERENTIAL/PLATELET
Abs Immature Granulocytes: 0.01 K/uL (ref 0.00–0.07)
Basophils Absolute: 0 K/uL (ref 0.0–0.1)
Basophils Relative: 0 %
Eosinophils Absolute: 0.2 K/uL (ref 0.0–0.5)
Eosinophils Relative: 3 %
HCT: 42.6 % (ref 39.0–52.0)
Hemoglobin: 13 g/dL (ref 13.0–17.0)
Immature Granulocytes: 0 %
Lymphocytes Relative: 18 %
Lymphs Abs: 0.9 K/uL (ref 0.7–4.0)
MCH: 28.6 pg (ref 26.0–34.0)
MCHC: 30.5 g/dL (ref 30.0–36.0)
MCV: 93.6 fL (ref 80.0–100.0)
Monocytes Absolute: 0.5 K/uL (ref 0.1–1.0)
Monocytes Relative: 10 %
Neutro Abs: 3.3 K/uL (ref 1.7–7.7)
Neutrophils Relative %: 69 %
Platelets: 175 K/uL (ref 150–400)
RBC: 4.55 MIL/uL (ref 4.22–5.81)
RDW: 13.2 % (ref 11.5–15.5)
WBC: 4.9 K/uL (ref 4.0–10.5)
nRBC: 0 % (ref 0.0–0.2)

## 2024-09-10 LAB — COMPREHENSIVE METABOLIC PANEL WITH GFR
ALT: 121 U/L — ABNORMAL HIGH (ref 0–44)
AST: 80 U/L — ABNORMAL HIGH (ref 15–41)
Albumin: 4.1 g/dL (ref 3.5–5.0)
Alkaline Phosphatase: 119 U/L (ref 38–126)
Anion gap: 13 (ref 5–15)
BUN: 15 mg/dL (ref 8–23)
CO2: 24 mmol/L (ref 22–32)
Calcium: 9.2 mg/dL (ref 8.9–10.3)
Chloride: 104 mmol/L (ref 98–111)
Creatinine, Ser: 1 mg/dL (ref 0.61–1.24)
GFR, Estimated: >60 mL/min (ref >60)
Glucose, Bld: 101 mg/dL — ABNORMAL HIGH (ref 70–99)
Potassium: 4.9 mmol/L (ref 3.5–5.1)
Sodium: 140 mmol/L (ref 135–145)
Total Bilirubin: 0.3 mg/dL (ref 0.0–1.2)
Total Protein: 6.9 g/dL (ref 6.5–8.1)

## 2024-09-10 MED ORDER — ONDANSETRON HCL 4 MG/2ML IJ SOLN
4.0000 mg | Freq: Once | INTRAMUSCULAR | Status: AC
  Administered 2024-09-10: 4 mg via INTRAVENOUS
  Filled 2024-09-10: qty 2

## 2024-09-10 MED ORDER — IOHEXOL 300 MG/ML  SOLN
100.0000 mL | Freq: Once | INTRAMUSCULAR | Status: AC | PRN
Start: 2024-09-10 — End: 2024-09-10
  Administered 2024-09-10: 100 mL via INTRAVENOUS

## 2024-09-10 MED ORDER — MORPHINE SULFATE (PF) 4 MG/ML IV SOLN
4.0000 mg | Freq: Once | INTRAVENOUS | Status: AC
  Administered 2024-09-10: 4 mg via INTRAVENOUS
  Filled 2024-09-10: qty 1

## 2024-09-10 NOTE — ED Notes (Signed)
 Pt aware a urine sample is needed. He stated he is unable to provide one at this time.

## 2024-09-10 NOTE — ED Provider Notes (Signed)
 [place] EMERGENCY DEPARTMENT AT [facility] Provider Note   CSN: [id number] Arrival date & time: 09/10/24  1657     Patient presents with: Abdominal Pain   Todd Estrada is a 62 y.o. male.  {Add pertinent medical, surgical, social history, OB history to HPI:32947}  Abdominal Pain    Patient has a history of hypertension stroke COPD acid reflux seizures lung cancer hypertension aphasia.  Patient has had prior stroke affecting his speech.  Patient only able to answer primarily with yes and no.  He reports pain in his lower abdomen.  Symptoms started today.  Patient denies vomiting or diarrhea.  No dysuria.  Prior to Admission medications   Medication Sig Start Date End Date Taking? Authorizing Provider  albuterol  (PROVENTIL ) (2.5 MG/3ML) 0.083% nebulizer solution Take 3 mLs (2.5 mg total) by nebulization every 4 (four) hours as needed for wheezing or shortness of breath. 08/16/22   Lou Claretta HERO, MD  atorvastatin  (LIPITOR) 40 MG tablet Take 1 tablet (40 mg total) by mouth daily. 08/14/24 08/14/25  [name], [name], DO  enoxaparin  (LOVENOX ) 100 MG/ML injection Inject 0.85 mLs (85 mg total) into the skin every 12 (twelve) hours. 08/14/24   [name], [name], DO  levETIRAcetam  (KEPPRA ) 500 MG tablet Take 1 tablet (500 mg total) by mouth 2 (two) times daily. Patient not taking: Reported on 08/05/2024 04/18/24   Whitfield Raisin, NP  lidocaine  (LIDODERM ) 5 % Place 1 patch onto the skin daily. Remove & Discard patch within 12 hours or as directed by MD 08/14/24   [name], [name], DO  Multiple Vitamin (MULTIVITAMIN WITH MINERALS) TABS tablet Take 1 tablet by mouth daily.    [provider]  nystatin  cream (MYCOSTATIN ) Apply to affected area 2 times daily Patient not taking: Reported on 08/05/2024 07/18/24   Logan Ubaldo NOVAK, PA-C  pantoprazole  (PROTONIX ) 40 MG tablet TAKE 1 TABLET(40 MG) BY MOUTH DAILY 07/08/24   Tawkaliyar, Roya, DO  sucralfate  (CARAFATE ) 1 g tablet Take 1  tablet (1 g total) by mouth 4 (four) times daily -  with meals and at bedtime. 08/14/24   [name], [name], DO  Tiotropium Bromide-Olodaterol (STIOLTO RESPIMAT ) 2.5-2.5 MCG/ACT AERS NEW PRESCRIPTION REQUEST: STIOLTO 2.5 MCG- INHALE TWO PUFFS BY MOUTH DAILY 08/14/24   [name], [name], DO    Allergies: Patient has no known allergies.    Review of Systems  Gastrointestinal:  Positive for abdominal pain.    Updated Vital Signs BP 126/75 (BP Location: Left Arm)   Pulse 82   Temp 98.7 F (37.1 C) (Oral)   Resp 16   SpO2 97%   Physical Exam Vitals and nursing note reviewed.  Constitutional:      General: He is not in acute distress.    Appearance: He is well-developed.  HENT:     Head: Normocephalic and atraumatic.     Right Ear: External ear normal.     Left Ear: External ear normal.  Eyes:     General: No scleral icterus.       Right eye: No discharge.        Left eye: No discharge.     Conjunctiva/sclera: Conjunctivae normal.  Neck:     Trachea: No tracheal deviation.  Cardiovascular:     Rate and Rhythm: Normal rate and regular rhythm.  Pulmonary:     Effort: Pulmonary effort is normal. No respiratory distress.     Breath sounds: Normal breath sounds. No stridor. No wheezing or rales.  Abdominal:  General: Bowel sounds are normal. There is no distension.     Palpations: Abdomen is soft.     Tenderness: There is abdominal tenderness in the right lower quadrant, suprapubic area and left lower quadrant. There is no guarding or rebound.  Musculoskeletal:        General: No tenderness or deformity.     Cervical back: Neck supple.  Skin:    General: Skin is warm and dry.     Findings: No rash.  Neurological:     General: No focal deficit present.     Mental Status: He is alert.     Cranial Nerves: No cranial nerve deficit, dysarthria or facial asymmetry.     Sensory: No sensory deficit.     Motor: No abnormal muscle tone or seizure activity.     Coordination:  Coordination normal.  Psychiatric:        Mood and Affect: Mood normal.     (all labs ordered are listed, but only abnormal results are displayed) Labs Reviewed  COMPREHENSIVE METABOLIC PANEL WITH GFR - Abnormal; Notable for the following components:      Result Value   Glucose, Bld 101 (*)    AST 80 (*)    ALT 121 (*)    All other components within normal limits  LIPASE, BLOOD  CBC WITH DIFFERENTIAL/PLATELET  URINALYSIS, ROUTINE W REFLEX MICROSCOPIC    EKG: None  Radiology: No results found.  {Document cardiac monitor, telemetry assessment procedure when appropriate:32947} Procedures   Medications Ordered in the ED  morphine  (PF) 4 MG/ML injection 4 mg (has no administration in time range)  ondansetron  (ZOFRAN ) injection 4 mg (has no administration in time range)    Clinical Course as of 09/10/24 2002  Tue Sep 10, 2024  1950 CBC with Diff CBC normal.  Metabolic panel shows slight increase in LFTs.  Lipase normal [JK]    Clinical Course User Index [JK] Randol Simmonds, MD   {Click here for ABCD2, HEART and other calculators REFRESH Note before signing:1}                              Medical Decision Making Amount and/or Complexity of Data Reviewed Labs: ordered. Decision-making details documented in ED Course. Radiology: ordered.  Risk Prescription drug management.   ***  {Document critical care time when appropriate  Document review of labs and clinical decision tools ie CHADS2VASC2, etc  Document your independent review of radiology images and any outside records  Document your discussion with family members, caretakers and with consultants  Document social determinants of health affecting pt's care  Document your decision making why or why not admission, treatments were needed:32947:::1}   Final diagnoses:  None    ED Discharge Orders     None

## 2024-09-10 NOTE — ED Triage Notes (Addendum)
 Pt reports with abdominal pain that goes across his lower abdomen since today.

## 2024-09-10 NOTE — Discharge Instructions (Signed)
 Blood test and CT scan did not show any acute abnormality.  You can take over-the-counter medications as needed for pain discomfort.  Follow-up with your doctor to be rechecked.  Return to the emergency room for fever vomiting or other concerning symptoms

## 2024-09-17 ENCOUNTER — Encounter (HOSPITAL_COMMUNITY): Payer: Self-pay | Admitting: Radiology

## 2024-09-17 ENCOUNTER — Emergency Department (HOSPITAL_COMMUNITY)

## 2024-09-17 ENCOUNTER — Emergency Department (HOSPITAL_COMMUNITY)
Admission: EM | Admit: 2024-09-17 | Discharge: 2024-09-17 | Disposition: A | Discharge status: Home / Self Care | Attending: Emergency Medicine | Admitting: Emergency Medicine

## 2024-09-17 ENCOUNTER — Other Ambulatory Visit: Payer: Self-pay

## 2024-09-17 DIAGNOSIS — R109 Unspecified abdominal pain: Secondary | ICD-10-CM | POA: Diagnosis present

## 2024-09-17 DIAGNOSIS — R10A1 Flank pain, right side: Secondary | ICD-10-CM

## 2024-09-17 MED ORDER — METHYLPREDNISOLONE 4 MG PO TBPK: ORAL_TABLET | ORAL | 0 refills | Status: DC

## 2024-09-17 MED ORDER — ACETAMINOPHEN 500 MG PO TABS
1000.0000 mg | ORAL_TABLET | Freq: Once | ORAL | Status: AC
  Administered 2024-09-17: 1000 mg via ORAL
  Filled 2024-09-17: qty 2

## 2024-09-17 MED ORDER — DICLOFENAC SODIUM 1 % EX GEL: 4.0000 g | Freq: Four times a day (QID) | CUTANEOUS | 0 refills | Status: DC

## 2024-09-17 MED ORDER — KETOROLAC TROMETHAMINE 15 MG/ML IJ SOLN
15.0000 mg | Freq: Once | INTRAMUSCULAR | Status: AC
Start: 2024-09-17 — End: 2024-09-17
  Administered 2024-09-17: 15 mg via INTRAMUSCULAR
  Filled 2024-09-17: qty 1

## 2024-09-17 MED ORDER — OXYCODONE HCL 5 MG PO TABS
5.0000 mg | ORAL_TABLET | Freq: Once | ORAL | Status: AC
  Administered 2024-09-17: 5 mg via ORAL
  Filled 2024-09-17: qty 1

## 2024-09-17 NOTE — ED Triage Notes (Signed)
 Pt dropped off by his son, reports right side abdominal pain. Seen previously for same.

## 2024-09-17 NOTE — Discharge Instructions (Addendum)
Your back pain is most likely due to a muscular strain.  There is been a lot of research on back pain, unfortunately the only thing that seems to really help is Tylenol and ibuprofen.  Relative rest is also important to not lift greater than 10 pounds bending or twisting at the waist.  Please follow-up with your family physician.  The other thing that really seems to benefit patients is physical therapy which your doctor may send you for.  Please return to the emergency department for new numbness or weakness to your arms or legs. Difficulty with urinating or urinating or pooping on yourself.  Also if you cannot feel toilet paper when you wipe or get a fever.   Use the gel as prescribed.  Take the steroids as prescribed Also take tylenol 1000mg (2 extra strength) four times a day.

## 2024-09-17 NOTE — ED Provider Notes (Signed)
 Patient signed out to me by previous provider. Please refer to their note for full HPI.  Briefly patient is here for complaint of right-sided abdominal pain and chronic low back pain.  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   Patient signed out pending CT of the abdomen and lower back.   ED Course: CT of the abdomen and lower back show chronic changes, no acute finding.  Patient improved with the symptomatic medication he was given here.  This has been prescribed by the previous provider we will plan for outpatient follow-up.  Patient agrees with this discharge plan.  Patient at this time appears safe and stable for discharge and close outpatient follow up. Discharge plan and strict return to ED precautions discussed, patient verbalizes understanding and agreement.   Bari Roxie HERO, DO 09/17/24 [id number]

## 2024-09-17 NOTE — ED Provider Notes (Signed)
 [place] EMERGENCY DEPARTMENT AT [facility] Provider Note   CSN: [id number] Arrival date & time: 09/17/24  1231     Patient presents with: Abdominal Pain   Todd Estrada is a 63 y.o. male.  {Add pertinent medical, surgical, social history, OB history to HPI:32947} 63 yo M with a chief complaints of right sided abdominal discomfort.  Worse with twisting turning certain positions.  He denies injury to the area.  Some difficulty with communication due to a prior stroke.   Abdominal Pain      Prior to Admission medications   Medication Sig Start Date End Date Taking? Authorizing Provider  albuterol  (PROVENTIL ) (2.5 MG/3ML) 0.083% nebulizer solution Take 3 mLs (2.5 mg total) by nebulization every 4 (four) hours as needed for wheezing or shortness of breath. 08/16/22   Lou Claretta HERO, MD  atorvastatin  (LIPITOR) 40 MG tablet Take 1 tablet (40 mg total) by mouth daily. 08/14/24 08/14/25  [name], [name], DO  enoxaparin  (LOVENOX ) 100 MG/ML injection Inject 0.85 mLs (85 mg total) into the skin every 12 (twelve) hours. 08/14/24   [name], [name], DO  levETIRAcetam  (KEPPRA ) 500 MG tablet Take 1 tablet (500 mg total) by mouth 2 (two) times daily. Patient not taking: Reported on 08/05/2024 04/18/24   Whitfield Raisin, NP  lidocaine  (LIDODERM ) 5 % Place 1 patch onto the skin daily. Remove & Discard patch within 12 hours or as directed by MD 08/14/24   [name], [name], DO  Multiple Vitamin (MULTIVITAMIN WITH MINERALS) TABS tablet Take 1 tablet by mouth daily.    [provider]  nystatin  cream (MYCOSTATIN ) Apply to affected area 2 times daily Patient not taking: Reported on 08/05/2024 07/18/24   Logan Ubaldo NOVAK, PA-C  pantoprazole  (PROTONIX ) 40 MG tablet TAKE 1 TABLET(40 MG) BY MOUTH DAILY 07/08/24   Tawkaliyar, Roya, DO  sucralfate  (CARAFATE ) 1 g tablet Take 1 tablet (1 g total) by mouth 4 (four) times daily -  with meals and at bedtime. 08/14/24   [name], [name], DO   Tiotropium Bromide-Olodaterol (STIOLTO RESPIMAT ) 2.5-2.5 MCG/ACT AERS NEW PRESCRIPTION REQUEST: STIOLTO 2.5 MCG- INHALE TWO PUFFS BY MOUTH DAILY 08/14/24   [name], [name], DO    Allergies: Patient has no known allergies.    Review of Systems  Gastrointestinal:  Positive for abdominal pain.    Updated Vital Signs BP 128/79 (BP Location: Left Arm)   Pulse 78   Temp 99 F (37.2 C) (Oral)   Resp 16   SpO2 99%   Physical Exam Vitals and nursing note reviewed.  Constitutional:      Appearance: He is well-developed.  HENT:     Head: Normocephalic and atraumatic.  Eyes:     Pupils: Pupils are equal, round, and reactive to light.  Neck:     Vascular: No JVD.  Cardiovascular:     Rate and Rhythm: Normal rate and regular rhythm.     Heart sounds: No murmur heard.    No friction rub. No gallop.  Pulmonary:     Effort: No respiratory distress.     Breath sounds: No wheezing.  Abdominal:     General: There is no distension.     Tenderness: There is no abdominal tenderness. There is no guarding or rebound.     Comments: No obvious discomfort on palpation of the abdomen  Musculoskeletal:        General: Normal range of motion.     Cervical back: Normal range of motion and neck supple.  Comments: Paraspinal muscular tenderness worse about the right low back.  No obvious midline spinal tenderness step-offs or deformities.  No rash.  Skin:    Coloration: Skin is not pale.     Findings: No rash.  Neurological:     Mental Status: He is alert and oriented to person, place, and time.  Psychiatric:        Behavior: Behavior normal.     (all labs ordered are listed, but only abnormal results are displayed) Labs Reviewed - No data to display  EKG: None  Radiology: No results found.  {Document cardiac monitor, telemetry assessment procedure when appropriate:32947} Procedures   Medications Ordered in the ED  acetaminophen  (TYLENOL ) tablet 1,000 mg (has no administration in  time range)  ketorolac  (TORADOL ) 15 MG/ML injection 15 mg (has no administration in time range)  oxyCODONE  (Oxy IR/ROXICODONE ) immediate release tablet 5 mg (has no administration in time range)      {Click here for ABCD2, HEART and other calculators REFRESH Note before signing:1}                              Medical Decision Making Amount and/or Complexity of Data Reviewed Radiology: ordered.  Risk OTC drugs. Prescription drug management.   63 yo M with a chief complaints of right-sided abdominal discomfort.  Sounds like musculoskeletal back pain by history and physical.  Will obtain CT imaging.  Treat symptoms reassess.  {Document critical care time when appropriate  Document review of labs and clinical decision tools ie CHADS2VASC2, etc  Document your independent review of radiology images and any outside records  Document your discussion with family members, caretakers and with consultants  Document social determinants of health affecting pt's care  Document your decision making why or why not admission, treatments were needed:32947:::1}   Final diagnoses:  None    ED Discharge Orders     None

## 2024-09-19 ENCOUNTER — Other Ambulatory Visit: Payer: Self-pay

## 2024-09-19 DIAGNOSIS — K219 Gastro-esophageal reflux disease without esophagitis: Secondary | ICD-10-CM

## 2024-09-25 ENCOUNTER — Emergency Department (HOSPITAL_COMMUNITY)
Admission: EM | Admit: 2024-09-25 | Discharge: 2024-09-25 | Disposition: A | Discharge status: Home / Self Care | Attending: Emergency Medicine | Admitting: Emergency Medicine

## 2024-09-25 ENCOUNTER — Other Ambulatory Visit: Payer: Self-pay

## 2024-09-25 ENCOUNTER — Emergency Department (HOSPITAL_COMMUNITY)

## 2024-09-25 ENCOUNTER — Encounter (HOSPITAL_COMMUNITY): Payer: Self-pay

## 2024-09-25 DIAGNOSIS — I10 Essential (primary) hypertension: Secondary | ICD-10-CM | POA: Insufficient documentation

## 2024-09-25 DIAGNOSIS — J449 Chronic obstructive pulmonary disease, unspecified: Secondary | ICD-10-CM | POA: Insufficient documentation

## 2024-09-25 DIAGNOSIS — M549 Dorsalgia, unspecified: Secondary | ICD-10-CM | POA: Insufficient documentation

## 2024-09-25 DIAGNOSIS — R1032 Left lower quadrant pain: Secondary | ICD-10-CM | POA: Insufficient documentation

## 2024-09-25 DIAGNOSIS — Z79899 Other long term (current) drug therapy: Secondary | ICD-10-CM | POA: Diagnosis not present

## 2024-09-25 LAB — URINALYSIS, ROUTINE W REFLEX MICROSCOPIC
Bilirubin Urine: NEGATIVE
Glucose, UA: NEGATIVE mg/dL
Hgb urine dipstick: NEGATIVE
Ketones, ur: NEGATIVE mg/dL
Nitrite: NEGATIVE
Protein, ur: NEGATIVE mg/dL
Specific Gravity, Urine: 1.009 (ref 1.005–1.030)
pH: 6 (ref 5.0–8.0)

## 2024-09-25 LAB — COMPREHENSIVE METABOLIC PANEL WITH GFR
ALT: 37 U/L (ref 0–44)
AST: 22 U/L (ref 15–41)
Albumin: 4.2 g/dL (ref 3.5–5.0)
Alkaline Phosphatase: 98 U/L (ref 38–126)
Anion gap: 13 (ref 5–15)
BUN: 15 mg/dL (ref 8–23)
CO2: 26 mmol/L (ref 22–32)
Calcium: 9.5 mg/dL (ref 8.9–10.3)
Chloride: 104 mmol/L (ref 98–111)
Creatinine, Ser: 0.93 mg/dL (ref 0.61–1.24)
GFR, Estimated: >60 mL/min (ref >60)
Glucose, Bld: 105 mg/dL — ABNORMAL HIGH (ref 70–99)
Potassium: 3.7 mmol/L (ref 3.5–5.1)
Sodium: 143 mmol/L (ref 135–145)
Total Bilirubin: 0.4 mg/dL (ref 0.0–1.2)
Total Protein: 6.7 g/dL (ref 6.5–8.1)

## 2024-09-25 LAB — CBC WITH DIFFERENTIAL/PLATELET
Abs Immature Granulocytes: 0.01 K/uL (ref 0.00–0.07)
Basophils Absolute: 0 K/uL (ref 0.0–0.1)
Basophils Relative: 0 %
Eosinophils Absolute: 0.2 K/uL (ref 0.0–0.5)
Eosinophils Relative: 3 %
HCT: 44.5 % (ref 39.0–52.0)
Hemoglobin: 13.7 g/dL (ref 13.0–17.0)
Immature Granulocytes: 0 %
Lymphocytes Relative: 21 %
Lymphs Abs: 1.2 K/uL (ref 0.7–4.0)
MCH: 28.5 pg (ref 26.0–34.0)
MCHC: 30.8 g/dL (ref 30.0–36.0)
MCV: 92.7 fL (ref 80.0–100.0)
Monocytes Absolute: 0.5 K/uL (ref 0.1–1.0)
Monocytes Relative: 8 %
Neutro Abs: 3.7 K/uL (ref 1.7–7.7)
Neutrophils Relative %: 68 %
Platelets: 165 K/uL (ref 150–400)
RBC: 4.8 MIL/uL (ref 4.22–5.81)
RDW: 13.2 % (ref 11.5–15.5)
WBC: 5.5 K/uL (ref 4.0–10.5)
nRBC: 0 % (ref 0.0–0.2)

## 2024-09-25 LAB — LIPASE, BLOOD: Lipase: 26 U/L (ref 11–51)

## 2024-09-25 MED ORDER — METHOCARBAMOL 500 MG PO TABS: 500.0000 mg | ORAL_TABLET | Freq: Two times a day (BID) | ORAL | 0 refills | Status: DC

## 2024-09-25 MED ORDER — METHOCARBAMOL 500 MG PO TABS
500.0000 mg | ORAL_TABLET | Freq: Once | ORAL | Status: AC
  Administered 2024-09-25: 500 mg via ORAL
  Filled 2024-09-25: qty 1

## 2024-09-25 MED ORDER — IOHEXOL 300 MG/ML  SOLN
100.0000 mL | Freq: Once | INTRAMUSCULAR | Status: AC | PRN
  Administered 2024-09-25: 100 mL via INTRAVENOUS

## 2024-09-25 NOTE — ED Notes (Signed)
 [name] Ronde patient emergency contact called and informed patient is ready for DC he stated he is on his way to get patient. Patient wants to wait in lobby.

## 2024-09-25 NOTE — ED Provider Notes (Signed)
 [facility] AT [facility] Provider Note   CSN: [id number] Arrival date & time: 09/25/24  1134     Patient presents with: Flank Pain   BURRIS MATHERNE is a 63 y.o. male.  Patient has poor communication due to previous stroke.  63 year old male presents to ED with complaints of left flank pain and left lower abdominal pain for 5 days.  Patient reports the pain is in his left flank and radiates to his left lower quadrant.  Patient was recently seen in the ED for similar complaints.  Patient has significant history of hypertension, COPD, PAD, stroke, asthma.  Patient has significant surgical history of cholecystectomy.  Due to communication issues and is difficult to find for further information.  Patient denies nausea, vomiting, diarrhea, chest pain or shortness of breath.     Prior to Admission medications   Medication Sig Start Date End Date Taking? Authorizing Provider  sucralfate  (CARAFATE ) 1 g tablet TAKE 1 TABLET(1 GRAM) BY MOUTH FOUR TIMES DAILY( WITH MEALS AND AT BEDTIME) 09/19/24   Tobie Gaines, DO  albuterol  (PROVENTIL ) (2.5 MG/3ML) 0.083% nebulizer solution Take 3 mLs (2.5 mg total) by nebulization every 4 (four) hours as needed for wheezing or shortness of breath. 08/16/22   Lou Claretta HERO, MD  atorvastatin  (LIPITOR) 40 MG tablet Take 1 tablet (40 mg total) by mouth daily. 08/14/24 08/14/25  [name], [name], DO  diclofenac  Sodium (VOLTAREN ) 1 % GEL Apply 4 g topically 4 (four) times daily. 09/17/24   Emil Share, DO  enoxaparin  (LOVENOX ) 100 MG/ML injection Inject 0.85 mLs (85 mg total) into the skin every 12 (twelve) hours. 08/14/24   [name], [name], DO  levETIRAcetam  (KEPPRA ) 500 MG tablet Take 1 tablet (500 mg total) by mouth 2 (two) times daily. Patient not taking: Reported on 08/05/2024 04/18/24   Whitfield Raisin, NP  lidocaine  (LIDODERM ) 5 % Place 1 patch onto the skin daily. Remove & Discard patch within 12 hours or as directed by MD 08/14/24    [name], [name], DO  methylPREDNISolone  (MEDROL  DOSEPAK) 4 MG TBPK tablet Day 1: 8mg  before breakfast, 4 mg after lunch, 4 mg after supper, and 8 mg at bedtime Day 2: 4 mg before breakfast, 4 mg after lunch, 4 mg  after supper, and 8 mg  at bedtime Day 3:  4 mg  before breakfast, 4 mg  after lunch, 4 mg after supper, and 4 mg  at bedtime Day 4: 4 mg  before breakfast, 4 mg  after lunch, and 4 mg at bedtime Day 5: 4 mg  before breakfast and 4 mg at bedtime Day 6: 4 mg  before breakfast 09/17/24   Floyd, Dan, DO  Multiple Vitamin (MULTIVITAMIN WITH MINERALS) TABS tablet Take 1 tablet by mouth daily.    [provider]  nystatin  cream (MYCOSTATIN ) Apply to affected area 2 times daily Patient not taking: Reported on 08/05/2024 07/18/24   Logan Ubaldo NOVAK, PA-C  pantoprazole  (PROTONIX ) 40 MG tablet TAKE 1 TABLET(40 MG) BY MOUTH DAILY 07/08/24   Tawkaliyar, Roya, DO  Tiotropium Bromide-Olodaterol (STIOLTO RESPIMAT ) 2.5-2.5 MCG/ACT AERS NEW PRESCRIPTION REQUEST: STIOLTO 2.5 MCG- INHALE TWO PUFFS BY MOUTH DAILY 08/14/24   [name], [name], DO    Allergies: Patient has no known allergies.    Review of Systems  Gastrointestinal:  Positive for abdominal pain.  Genitourinary:  Positive for flank pain.  All other systems reviewed and are negative.   Updated Vital Signs BP (!) 146/98 (BP Location: Left Arm)  Pulse 84   Temp 98 F (36.7 C) (Oral)   Resp 16   SpO2 100%   Physical Exam Vitals and nursing note reviewed.  Constitutional:      Appearance: Normal appearance.  HENT:     Head: Normocephalic and atraumatic.     Nose: Nose normal.  Eyes:     Extraocular Movements: Extraocular movements intact.     Conjunctiva/sclera: Conjunctivae normal.     Pupils: Pupils are equal, round, and reactive to light.  Cardiovascular:     Rate and Rhythm: Normal rate.  Pulmonary:     Effort: Pulmonary effort is normal. No respiratory distress.  Abdominal:     Tenderness: There is left CVA tenderness.  There is no right CVA tenderness.  Musculoskeletal:        General: No swelling, tenderness or deformity. Normal range of motion.     Cervical back: Normal range of motion.     Right lower leg: No edema.     Left lower leg: No edema.  Skin:    General: Skin is warm.     Capillary Refill: Capillary refill takes less than 2 seconds.  Neurological:     General: No focal deficit present.     Mental Status: He is alert.  Psychiatric:        Mood and Affect: Mood normal.        Behavior: Behavior normal.     (all labs ordered are listed, but only abnormal results are displayed) Labs Reviewed  COMPREHENSIVE METABOLIC PANEL WITH GFR - Abnormal; Notable for the following components:      Result Value   Glucose, Bld 105 (*)    All other components within normal limits  LIPASE, BLOOD  CBC WITH DIFFERENTIAL/PLATELET  URINALYSIS, ROUTINE W REFLEX MICROSCOPIC    EKG: None  Radiology: No results found.  Procedures   Medications Ordered in the ED - No data to display   63 y.o. male presents to the ED with complaints of left lower abdominal pain and left flank pain, this involves an extensive number of treatment options, and is a complaint that carries with it a high risk of complications and morbidity.  The differential diagnosis includes pyelonephritis, UTI, nephrolithiasis, diverticulitis, gastritis, abdominal aortic aneurysm, (Ddx)  On arrival pt is nontoxic, vitals unremarkable. Exam significant for left lower abdominal pain and left flank pain.  Additional history obtained from chart review patient followed by internal medicine for Lovenox  twice daily for history of PE  I ordered medication *** for ***  Lab Tests:  I Ordered, reviewed, and interpreted labs, which included: CMP, CBC, UA, lipase.  Imaging Studies ordered:  I ordered imaging studies which included ***, I independently visualized and interpreted imaging which showed ***  ED Course:   63 year old male presents  ED with complaints of left lower abdominal pain and left flank pain x 5 days.  Communication is difficult because patient has communication issues due to previous stroke.   Portions of this note were generated with Scientist, clinical (histocompatibility and immunogenetics). Dictation errors may occur despite best attempts at proofreading.   Final diagnoses:  None    ED Discharge Orders     None

## 2024-09-25 NOTE — ED Triage Notes (Signed)
 Pt reports with left flank pain x 5 days.

## 2024-09-25 NOTE — Discharge Instructions (Addendum)
 I have prescribed Robaxin  to help with the back pain.  Your CT scan was reassuring today.  Your CT scan did note some narrowing of the vessels in your abdomen.  It is recommended to follow-up with a vascular doctor.  It is advised to follow-up with a primary care provider for further management.  If concerning symptoms arise please return the ED for further evaluation.

## 2024-10-03 ENCOUNTER — Encounter: Admitting: Student

## 2024-10-08 ENCOUNTER — Other Ambulatory Visit: Payer: Self-pay

## 2024-10-08 ENCOUNTER — Ambulatory Visit: Admitting: Student

## 2024-10-08 ENCOUNTER — Encounter: Payer: Self-pay | Admitting: Student

## 2024-10-08 VITALS — BP 108/66 | HR 76 | Temp 98.1°F | Ht 72.0 in | Wt 198.4 lb

## 2024-10-08 DIAGNOSIS — Z8669 Personal history of other diseases of the nervous system and sense organs: Secondary | ICD-10-CM | POA: Diagnosis not present

## 2024-10-08 DIAGNOSIS — Z7409 Other reduced mobility: Secondary | ICD-10-CM | POA: Diagnosis not present

## 2024-10-08 DIAGNOSIS — I63512 Cerebral infarction due to unspecified occlusion or stenosis of left middle cerebral artery: Secondary | ICD-10-CM

## 2024-10-08 DIAGNOSIS — Z1211 Encounter for screening for malignant neoplasm of colon: Secondary | ICD-10-CM

## 2024-10-08 DIAGNOSIS — Z8673 Personal history of transient ischemic attack (TIA), and cerebral infarction without residual deficits: Secondary | ICD-10-CM

## 2024-10-08 DIAGNOSIS — Z7901 Long term (current) use of anticoagulants: Secondary | ICD-10-CM | POA: Diagnosis not present

## 2024-10-08 DIAGNOSIS — M25552 Pain in left hip: Secondary | ICD-10-CM | POA: Diagnosis present

## 2024-10-08 DIAGNOSIS — R569 Unspecified convulsions: Secondary | ICD-10-CM

## 2024-10-08 MED ORDER — DICLOFENAC SODIUM 1 % EX GEL: 2.0000 g | Freq: Four times a day (QID) | CUTANEOUS | 3 refills | Status: DC

## 2024-10-08 NOTE — Assessment & Plan Note (Addendum)
 Orders:    Ambulatory referral to Neurology

## 2024-10-08 NOTE — Patient Instructions (Signed)
 Thank you, Todd Estrada for allowing us  to provide your care today. Today we discussed:  -Voltaren  gel up to 4 times a day for pain -Tylenol  1000 mg every 8 hours AS NEEDED -Referral to Physical therapy -Need to go back to Neurology to discuss seizure and medication   Follow up: 3-4 months    Should you have any questions or concerns please call the internal medicine clinic at [phone].    Yeni Jiggetts, D.O. [facility]

## 2024-10-08 NOTE — Assessment & Plan Note (Signed)
>>  ASSESSMENT AND PLAN FOR CVA (CEREBRAL VASCULAR ACCIDENT) (HCC) WRITTEN ON 10/08/2024  7:01 PM BY Kenni Newton, DO   Orders: .  Ambulatory referral to Home Health

## 2024-10-08 NOTE — Assessment & Plan Note (Signed)
>>  ASSESSMENT AND PLAN FOR HISTORY OF STROKE WRITTEN ON 10/09/2024  1:26 PM BY Kaisyn Reinhold, DO   >>ASSESSMENT AND PLAN FOR CVA (CEREBRAL VASCULAR ACCIDENT) (HCC) WRITTEN ON 10/08/2024  7:01 PM BY Breeze Angell, DO   Orders:   Ambulatory referral to Home Health

## 2024-10-08 NOTE — Progress Notes (Unsigned)
 CC: ED f/u  HPI: [name] is a 63 y.o. male living with a history stated below and presents today for ED f/u. Please see problem based assessment and plan for additional details.  Past Medical History:  Diagnosis Date   Asthma    Atypical chest pain 08/13/2022   Community acquired pneumonia 09/14/2022   COPD (chronic obstructive pulmonary disease) (HCC)    Essential hypertension 08/19/2021   GERD (gastroesophageal reflux disease)    HAP (hospital-acquired pneumonia) 09/16/2022   History of tracheostomy    03/09/22-04/11/22   HLD (hyperlipidemia)    Hypertension    Hypokalemia 08/13/2022   Hypomagnesemia 08/13/2022   Lung cancer (HCC)    PAD (peripheral artery disease)    Seizures (HCC) 06/02/2022   Sepsis (HCC) 08/13/2022   Sepsis due to pneumonia (HCC) 04/13/2023   Stroke (HCC) 02/2022    Current Outpatient Medications on File Prior to Visit  Medication Sig Dispense Refill   albuterol  (PROVENTIL ) (2.5 MG/3ML) 0.083% nebulizer solution Take 3 mLs (2.5 mg total) by nebulization every 4 (four) hours as needed for wheezing or shortness of breath. 90 mL 12   atorvastatin  (LIPITOR) 40 MG tablet Take 1 tablet (40 mg total) by mouth daily. 90 tablet 3   enoxaparin  (LOVENOX ) 100 MG/ML injection Inject 0.85 mLs (85 mg total) into the skin every 12 (twelve) hours. 51 mL 2   levETIRAcetam  (KEPPRA ) 500 MG tablet Take 1 tablet (500 mg total) by mouth 2 (two) times daily. (Patient not taking: Reported on 08/05/2024) 180 tablet 0   lidocaine  (LIDODERM ) 5 % Place 1 patch onto the skin daily. Remove & Discard patch within 12 hours or as directed by MD 30 patch 0   Multiple Vitamin (MULTIVITAMIN WITH MINERALS) TABS tablet Take 1 tablet by mouth daily.     pantoprazole  (PROTONIX ) 40 MG tablet TAKE 1 TABLET(40 MG) BY MOUTH DAILY 90 tablet 2   sucralfate  (CARAFATE ) 1 g tablet TAKE 1 TABLET(1 GRAM) BY MOUTH FOUR TIMES DAILY( WITH MEALS AND AT BEDTIME) 360 tablet 1   Tiotropium  Bromide-Olodaterol (STIOLTO RESPIMAT ) 2.5-2.5 MCG/ACT AERS NEW PRESCRIPTION REQUEST: STIOLTO 2.5 MCG- INHALE TWO PUFFS BY MOUTH DAILY 12 g 3   No current facility-administered medications on file prior to visit.    Family History  Problem Relation Age of Onset   Throat cancer Mother    Liver cancer Father    Kidney failure Sister    Cancer - Lung Paternal Uncle     Social History   Socioeconomic History   Marital status: Widowed    Spouse name: Not on file   Number of children: Not on file   Years of education: Not on file   Highest education level: Not on file  Occupational History   Not on file  Tobacco Use   Smoking status: Former    Current packs/day: 0.00    Types: Cigarettes    Quit date: 05/02/2022    Years since quitting: 2.4    Passive exposure: Never   Smokeless tobacco: Never  Vaping Use   Vaping status: Never Used  Substance and Sexual Activity   Alcohol use: Not Currently   Drug use: No   Sexual activity: Not Currently  Other Topics Concern   Not on file  Social History Narrative   Not on file   Social Drivers of Health   Financial Resource Strain: Not on file  Food Insecurity: No Food Insecurity (07/02/2024)   Hunger Vital Sign  Worried About Programme researcher, broadcasting/film/video in the Last Year: Never true    Ran Out of Food in the Last Year: Never true  Transportation Needs: No Transportation Needs (07/02/2024)   PRAPARE - Administrator, Civil Service (Medical): No    Lack of Transportation (Non-Medical): No  Physical Activity: Not on file  Stress: Not on file  Social Connections: Socially Isolated (04/26/2024)   Social Connection and Isolation Panel    Frequency of Communication with Friends and Family: Once a week    Frequency of Social Gatherings with Friends and Family: Once a week    Attends Religious Services: Never    Database administrator or Organizations: No    Attends Banker Meetings: Never    Marital Status: Widowed   Intimate Partner Violence: Not At Risk (07/02/2024)   Humiliation, Afraid, Rape, and Kick questionnaire    Fear of Current or Ex-Partner: No    Emotionally Abused: No    Physically Abused: No    Sexually Abused: No    Review of Systems: ROS negative except for what is noted on the assessment and plan.  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 Minimal ambulation mainly just to the bedside commode.  Caregiver assists with most ADLs.  Has had PT come to the hospital for.  I believe this would be beneficial for patient, referral placed.   Orders:   Ambulatory referral to Home Health  Seizure [facility]) Was on Keppra  500 mg twice daily but ran out in May.  Has been off of Keppra  since then and caregiver denies any episodes of seizures.  Thought he was weaned off/stopped since no more refills. First reported and documented by neurology in setting of recurrent stroke years ago.  Appears patient has not followed up, encouraged caregiver to contact neurology office to schedule appt.  New referral placed in case. Return precautions discussed w/ patient and caregiver.   Orders:   Ambulatory referral to Neurology  Left hip pain ED visit on 10/8 for left lower abdominal, flank and hip pain.  Had endorsed acute onset but patient and caregiver reports to me that pain is chronic in nature  for at least 1 year.  UA obtained with leukocytes and rare bacteria but patient denies any dysuria or hematuria.  CT abdomen pelvis without acute findings but noted nonobstructing intrarenal stones, no obstruction.  Noted severe abdominal aortic atherosclerosis left greater than right, has follow-up with [facility] on 11/20.  No acute bony abnormalities seen either. Denies any symptoms/discoloration changes of LLE.  Caregiver denies any skin findings or changes.  Caregiver denies any new wounds. Denies any recent falls, trauma or injuries.  Has bruising on lower abdomen which appears to be after Lovenox  injection sites.  ROM in general limited due to impaired mobility in the setting of prior strokes. Pain not worsen if better after ED,  sent home w/ Robaxin . Will trial Voltaren  gel in the meantime.  Orders:   diclofenac  Sodium (VOLTAREN ) 1 % GEL; Apply 2 g topically 4 (four) times daily.    Return in about 3 months (around 01/08/2025) for routine visit.   Patient discussed with Dr. CHARLENA Rosan Ozell Elicia, D.O. [facility], PGY-3 Clinic Phone: [phone] Date 10/08/2024 Time 7:01 PM

## 2024-10-08 NOTE — Assessment & Plan Note (Addendum)
  Orders:   Ambulatory referral to Home Health

## 2024-10-08 NOTE — Assessment & Plan Note (Deleted)
 Orders:    Ambulatory referral to Home Health

## 2024-10-09 ENCOUNTER — Encounter (HOSPITAL_COMMUNITY): Payer: Self-pay | Admitting: Emergency Medicine

## 2024-10-09 ENCOUNTER — Emergency Department (HOSPITAL_COMMUNITY)

## 2024-10-09 ENCOUNTER — Emergency Department (HOSPITAL_COMMUNITY)
Admission: EM | Admit: 2024-10-09 | Discharge: 2024-10-09 | Disposition: A | Discharge status: Home / Self Care | Attending: Emergency Medicine | Admitting: Emergency Medicine

## 2024-10-09 ENCOUNTER — Other Ambulatory Visit: Payer: Self-pay

## 2024-10-09 DIAGNOSIS — R079 Chest pain, unspecified: Secondary | ICD-10-CM | POA: Diagnosis present

## 2024-10-09 DIAGNOSIS — R1013 Epigastric pain: Secondary | ICD-10-CM | POA: Diagnosis not present

## 2024-10-09 DIAGNOSIS — J449 Chronic obstructive pulmonary disease, unspecified: Secondary | ICD-10-CM | POA: Diagnosis not present

## 2024-10-09 DIAGNOSIS — Z85118 Personal history of other malignant neoplasm of bronchus and lung: Secondary | ICD-10-CM | POA: Insufficient documentation

## 2024-10-09 DIAGNOSIS — I509 Heart failure, unspecified: Secondary | ICD-10-CM | POA: Insufficient documentation

## 2024-10-09 DIAGNOSIS — Z8673 Personal history of transient ischemic attack (TIA), and cerebral infarction without residual deficits: Secondary | ICD-10-CM | POA: Insufficient documentation

## 2024-10-09 DIAGNOSIS — D649 Anemia, unspecified: Secondary | ICD-10-CM | POA: Insufficient documentation

## 2024-10-09 DIAGNOSIS — R0789 Other chest pain: Secondary | ICD-10-CM

## 2024-10-09 LAB — CBC
HCT: 40.7 % (ref 39.0–52.0)
Hemoglobin: 12.8 g/dL — ABNORMAL LOW (ref 13.0–17.0)
MCH: 29.7 pg (ref 26.0–34.0)
MCHC: 31.4 g/dL (ref 30.0–36.0)
MCV: 94.4 fL (ref 80.0–100.0)
Platelets: 131 K/uL — ABNORMAL LOW (ref 150–400)
RBC: 4.31 MIL/uL (ref 4.22–5.81)
RDW: 13.1 % (ref 11.5–15.5)
WBC: 5 K/uL (ref 4.0–10.5)
nRBC: 0 % (ref 0.0–0.2)

## 2024-10-09 LAB — TROPONIN I (HIGH SENSITIVITY)
Troponin I (High Sensitivity): 4 ng/L (ref <18)
Troponin I (High Sensitivity): 5 ng/L (ref <18)

## 2024-10-09 LAB — BASIC METABOLIC PANEL WITH GFR
Anion gap: 9 (ref 5–15)
BUN: 13 mg/dL (ref 8–23)
CO2: 25 mmol/L (ref 22–32)
Calcium: 8.6 mg/dL — ABNORMAL LOW (ref 8.9–10.3)
Chloride: 106 mmol/L (ref 98–111)
Creatinine, Ser: 0.91 mg/dL (ref 0.61–1.24)
GFR, Estimated: >60 mL/min (ref >60)
Glucose, Bld: 107 mg/dL — ABNORMAL HIGH (ref 70–99)
Potassium: 3.6 mmol/L (ref 3.5–5.1)
Sodium: 140 mmol/L (ref 135–145)

## 2024-10-09 LAB — LIPASE, BLOOD: Lipase: 25 U/L (ref 11–51)

## 2024-10-09 MED ORDER — IOHEXOL 350 MG/ML SOLN
75.0000 mL | Freq: Once | INTRAVENOUS | Status: AC | PRN
  Administered 2024-10-09: 75 mL via INTRAVENOUS

## 2024-10-09 NOTE — ED Provider Notes (Signed)
 Care assumed from Dr. Jerrol.  At time of transfer of care, patient awaiting results of CT imaging to determine disposition due to this chest pain.  If imaging reassuring, patient may be a candidate for discharge home given reassuring vital signs.  Waiting for labs and imaging to return.  5:55 PM Patient's workup returned overall reassuring.  Troponin negative x 2 and his CT scans did not show evidence of pulmonary embolism or other intra-abdominal pathology.  His CT showed some possible correction of his cancer but no large pneumonia or other changes seen.  Rest of his labs similar to prior.  I reassessed patient he is resting comfortably.  He has aphasia but gave me a thumbs up that he feels comfortable with discharge home and follow-up with his PCP.  He will get some to eat and drink and we will plan for discharge for outpatient follow-up.  He has reassuring vital signs and we will discharge for further management as an outpatient.   Clinical Impression: 1. Atypical chest pain   2. Nonspecific chest pain     Disposition: Discharge  Condition: Good  I have discussed the results, Dx and Tx plan with the pt(& family if present). He/she/they expressed understanding and agree(s) with the plan. Discharge instructions discussed at great length. Strict return precautions discussed and pt &/or family have verbalized understanding of the instructions. No further questions at time of discharge.    New Prescriptions   No medications on file    Follow Up: [facility] [address] [place] [address] [place]  [id number] [phone] Schedule an appointment as soon as possible for a visit    your pcp         Dareon Nunziato, Lonni PARAS, MD 10/09/24 2311

## 2024-10-09 NOTE — ED Provider Notes (Signed)
 [place] EMERGENCY DEPARTMENT AT [facility] Provider Note   CSN: [id number] Arrival date & time: 10/09/24  1328     Patient presents with: Chest Pain   Todd Estrada is a 63 y.o. male.    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  The history is limited as the patient is nonverbal with expressive aphasia at baseline. Is able to understand questions and answer yes or no questions by nodding. Family had reported to EMS that the patient had been complaining of left-sided chest pain after having his morning medications.  He has stage IV lung cancer currently not receiving any treatment.  No aspirin  was given due to concern for dysphagia.  When asked if he has chest discomfort he nods yes, points to the left side.  Also points to the epigastrium.  Endorses cough, denies shortness of breath.  Prior to Admission medications   Medication Sig Start Date End Date Taking? Authorizing Provider  albuterol  (PROVENTIL ) (2.5 MG/3ML) 0.083% nebulizer solution Take 3 mLs (2.5 mg total) by nebulization every 4 (four) hours as needed for wheezing or shortness of breath. 08/16/22   Lou Claretta HERO, MD  atorvastatin  (LIPITOR) 40 MG tablet Take 1 tablet (40 mg total) by mouth daily. 08/14/24 08/14/25  [name], [name], DO  diclofenac  Sodium (VOLTAREN ) 1 % GEL Apply 2 g topically 4 (four) times daily. 10/08/24   Zheng, Michael, DO  enoxaparin  (LOVENOX ) 100 MG/ML injection Inject 0.85 mLs (85 mg total) into the skin every 12 (twelve) hours. 08/14/24   [name], [name], DO  levETIRAcetam  (KEPPRA ) 500 MG tablet Take 1 tablet (500 mg total) by mouth 2 (two) times daily. Patient not taking: Reported on 08/05/2024 04/18/24   Whitfield Raisin, NP   lidocaine  (LIDODERM ) 5 % Place 1 patch onto the skin daily. Remove & Discard patch within 12 hours or as directed by MD 08/14/24   [name], [name], DO  Multiple Vitamin (MULTIVITAMIN WITH MINERALS) TABS tablet Take 1 tablet by mouth daily.    [provider]  pantoprazole  (PROTONIX ) 40 MG tablet TAKE 1 TABLET(40 MG) BY MOUTH DAILY 07/08/24   Tawkaliyar, Roya, DO  sucralfate  (CARAFATE ) 1 g tablet TAKE 1 TABLET(1 GRAM) BY MOUTH FOUR TIMES DAILY( WITH MEALS AND AT BEDTIME) 09/19/24   Tobie Gaines, DO  Tiotropium Bromide-Olodaterol (STIOLTO RESPIMAT ) 2.5-2.5 MCG/ACT AERS NEW PRESCRIPTION REQUEST: STIOLTO 2.5 MCG- INHALE TWO PUFFS BY MOUTH DAILY 08/14/24   [name], [name], DO    Allergies: Patient has no known allergies.    Review of Systems  Respiratory:  Positive for cough.   Cardiovascular:  Positive for chest pain.  All other systems reviewed and are negative.   Updated Vital Signs BP 130/84   Pulse 85   Temp 98.1 F (36.7 C)   Resp 12   Ht 6' (1.829 m)   Wt 89.8 kg   SpO2 100%   BMI 26.85 kg/m   Physical Exam Vitals and nursing note reviewed.  Constitutional:      General: He is not in acute distress.    Appearance: He is well-developed.  HENT:     Head: Normocephalic and atraumatic.  Eyes:     Conjunctiva/sclera: Conjunctivae normal.  Cardiovascular:     Rate and Rhythm: Normal rate and  regular rhythm.     Heart sounds: No murmur heard. Pulmonary:     Effort: Pulmonary effort is normal. No respiratory distress.     Breath sounds: Normal breath sounds.  Abdominal:     Palpations: Abdomen is soft.     Tenderness: There is abdominal tenderness in the epigastric area.  Musculoskeletal:        General: No swelling.     Cervical back: Neck supple.  Skin:    General: Skin is warm and dry.     Capillary Refill: Capillary refill takes less than 2 seconds.  Neurological:     Mental Status: He is alert.  Psychiatric:        Mood and Affect: Mood normal.     (all  labs ordered are listed, but only abnormal results are displayed) Labs Reviewed  CBC - Abnormal; Notable for the following components:      Result Value   Hemoglobin 12.8 (*)    Platelets 131 (*)    All other components within normal limits  BASIC METABOLIC PANEL WITH GFR  LIPASE, BLOOD  TROPONIN I (HIGH SENSITIVITY)    EKG: EKG Interpretation Date/Time:  Wednesday October 09 2024 14:05:20 EDT Ventricular Rate:  69 PR Interval:  167 QRS Duration:  102 QT Interval:  372 QTC Calculation: 399 R Axis:   64  Text Interpretation: Sinus rhythm Low voltage, precordial leads Confirmed by Jerrol Agent ([id number]) on 10/09/2024 3:03:19 PM  Radiology: DG Chest Portable 1 View Result Date: 10/09/2024 EXAM: 1 VIEW(S) XRAY OF THE CHEST 10/09/2024 02:21:00 PM COMPARISON: 09/25/2024 CLINICAL HISTORY: cp. Triage notes:Per [facility] pt coming from home- pt is nonverbal since having a stroke. Family called out states patient began c/o chest pain after having his morning meds. Patient has stage 4 lung cancer not currently receiving treatment. 20G L hand. Per ; EMS no aspirin  given due to hx of dysphagia  FINDINGS: LUNGS AND PLEURA: Stable bandlike scarring along lateral right mid lung and left mid lung compatible with changes secondary to external beam radiation. Stable left hilar mass corresponding to patient's known malignancy. Emphysema. No pulmonary edema. No pleural effusion. No pneumothorax. HEART AND MEDIASTINUM: Aortic arch atherosclerosis. No acute abnormality of the cardiac and mediastinal silhouettes. BONES AND SOFT TISSUES: No acute osseous abnormality. IMPRESSION: 1. No acute findings. 2. Stable left hilar mass corresponding to the patients known malignancy. 3. Bandlike opacities within bilateral mid lung zones are compatible with changes secondary to external beam radiation. Electronically signed by: Waddell Calk MD 10/09/2024 02:46 PM EDT RP Workstation: [id number]     Procedures   Medications  Ordered in the ED - No data to display                                  Medical Decision Making Amount and/or Complexity of Data Reviewed Labs: ordered. Radiology: ordered.    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  The history is limited as the patient is nonverbal with expressive aphasia at baseline. Is able to understand questions and answer yes or no questions by nodding. Family had reported to EMS that the patient had been complaining of left-sided chest pain after having his morning medications.  He has stage IV lung cancer currently not  receiving any treatment.  No aspirin  was given due to concern for dysphagia.  When asked if he has chest discomfort he nods yes, points to the left side.  Also points to the epigastrium.  Endorses cough, denies shortness of breath.  On arrival, the patient was vitally stable.  Appears to be neurologically at baseline, nonverbal.  Able to communicate through nodding and grunting yes or no and shaking his head yes or no.  Seems to be endorsing left-sided chest discomfort as well as has epigastric tenderness to palpation on exam.  Differential diagnosis is broad.  Had endorses nursing that he had been having a cough.  Considered viral infection, bacterial pneumonia, pneumothorax, PE, pulmonary embolism, pancreatitis, gastritis, GERD, ACS, other acute intra-abdominal or intrathoracic emergency.  EKG: Sinus rhythm, ventricular rate 69, no acute ischemic changes, no STEMI.  Chest x-ray:  IMPRESSION:  1. No acute findings.  2. Stable left hilar mass corresponding to the patients known malignancy.  3. Bandlike opacities within bilateral mid lung zones are compatible with  changes secondary to external beam radiation.    Labs: CBC  without a leukocytosis, mild anemia to 12.8, lipase, BMP intracardiac troponin was collected and pending.  Will obtain CT abdomen pelvis to evaluate for possible pancreatitis other abdominal emergency, CTA PE study will also be obtained given the patient's history of cancer.  Signout given to Dr. Waddell to follow-up results of laboratory testing and diagnostic imaging.  Ultimate disposition pending results of diagnostic testing and reassessment.  Signout given at 1500.     Final diagnoses:  None    ED Discharge Orders     None          Jerrol Agent, MD 10/09/24 1506

## 2024-10-09 NOTE — ED Triage Notes (Signed)
 Per [facility] pt coming from home- pt is nonverbal since having a stroke. Family called out states patient began c/o chest pain after having his morning meds. Patient has stage 4 lung cancer not currently receiving treatment. 20G L hand. Per EMS no aspirin  given due to hx of dysphagia.

## 2024-10-09 NOTE — ED Notes (Signed)
 Pt endorses non productive cough; denies fevers

## 2024-10-09 NOTE — ED Notes (Signed)
 Pt's jacket found in room after discharge. Cousin notified and said he was coming back to get it. Jacket placed in pt's belonging bag with pt label and placed in lost and found.

## 2024-10-09 NOTE — Discharge Instructions (Signed)
 Your history, exam, workup today did not show evidence of acute heart attack or did show evidence of a blood clot.  This shows some mild progression of your tumor in your chest but no evidence of other acute abnormality.  Based on your reassuring vital signs and improvement in symptoms we feel you are safe for discharge home for outpatient PCP follow-up.  Please rest and stay hydrated and if any symptoms change or worsen acutely, please return to the nearest emergency department.

## 2024-10-09 NOTE — ED Notes (Signed)
 Pt's emergency contact, [name] Ronde, called to come pick pt up for discharge.

## 2024-10-09 NOTE — ED Notes (Signed)
 Pt placed on bedpan

## 2024-10-14 NOTE — Progress Notes (Signed)
 Internal Medicine Clinic Attending  Case discussed with the resident at the time of the visit.  We reviewed the resident's history and exam and pertinent patient test results.  I agree with the assessment, diagnosis, and plan of care documented in the resident's note.

## 2024-10-16 ENCOUNTER — Encounter (HOSPITAL_COMMUNITY): Payer: Self-pay | Admitting: Emergency Medicine

## 2024-10-16 ENCOUNTER — Emergency Department (HOSPITAL_COMMUNITY)
Admission: EM | Admit: 2024-10-16 | Discharge: 2024-10-17 | Disposition: A | Discharge status: Home / Self Care | Attending: Emergency Medicine | Admitting: Emergency Medicine

## 2024-10-16 ENCOUNTER — Emergency Department (HOSPITAL_COMMUNITY)

## 2024-10-16 DIAGNOSIS — R1084 Generalized abdominal pain: Secondary | ICD-10-CM | POA: Insufficient documentation

## 2024-10-16 DIAGNOSIS — J449 Chronic obstructive pulmonary disease, unspecified: Secondary | ICD-10-CM | POA: Insufficient documentation

## 2024-10-16 DIAGNOSIS — Z85118 Personal history of other malignant neoplasm of bronchus and lung: Secondary | ICD-10-CM | POA: Diagnosis not present

## 2024-10-16 DIAGNOSIS — R109 Unspecified abdominal pain: Secondary | ICD-10-CM | POA: Diagnosis present

## 2024-10-16 DIAGNOSIS — R112 Nausea with vomiting, unspecified: Secondary | ICD-10-CM | POA: Diagnosis not present

## 2024-10-16 DIAGNOSIS — I509 Heart failure, unspecified: Secondary | ICD-10-CM | POA: Diagnosis not present

## 2024-10-16 DIAGNOSIS — Z8673 Personal history of transient ischemic attack (TIA), and cerebral infarction without residual deficits: Secondary | ICD-10-CM | POA: Insufficient documentation

## 2024-10-16 LAB — CBC
HCT: 48.6 % (ref 39.0–52.0)
Hemoglobin: 14.5 g/dL (ref 13.0–17.0)
MCH: 28.8 pg (ref 26.0–34.0)
MCHC: 29.8 g/dL — ABNORMAL LOW (ref 30.0–36.0)
MCV: 96.6 fL (ref 80.0–100.0)
Platelets: 198 K/uL (ref 150–400)
RBC: 5.03 MIL/uL (ref 4.22–5.81)
RDW: 13.4 % (ref 11.5–15.5)
WBC: 5.3 K/uL (ref 4.0–10.5)
nRBC: 0 % (ref 0.0–0.2)

## 2024-10-16 LAB — COMPREHENSIVE METABOLIC PANEL WITH GFR
ALT: 113 U/L — ABNORMAL HIGH (ref 0–44)
AST: 88 U/L — ABNORMAL HIGH (ref 15–41)
Albumin: 4.7 g/dL (ref 3.5–5.0)
Alkaline Phosphatase: 142 U/L — ABNORMAL HIGH (ref 38–126)
Anion gap: 18 — ABNORMAL HIGH (ref 5–15)
BUN: 9 mg/dL (ref 8–23)
CO2: 24 mmol/L (ref 22–32)
Calcium: 9.9 mg/dL (ref 8.9–10.3)
Chloride: 102 mmol/L (ref 98–111)
Creatinine, Ser: 1.07 mg/dL (ref 0.61–1.24)
GFR, Estimated: >60 mL/min (ref >60)
Glucose, Bld: 104 mg/dL — ABNORMAL HIGH (ref 70–99)
Potassium: 3.6 mmol/L (ref 3.5–5.1)
Sodium: 144 mmol/L (ref 135–145)
Total Bilirubin: 0.3 mg/dL (ref 0.0–1.2)
Total Protein: 8.3 g/dL — ABNORMAL HIGH (ref 6.5–8.1)

## 2024-10-16 LAB — LIPASE, BLOOD: Lipase: 19 U/L (ref 11–51)

## 2024-10-16 MED ORDER — MORPHINE SULFATE (PF) 4 MG/ML IV SOLN
4.0000 mg | Freq: Once | INTRAVENOUS | Status: AC
  Administered 2024-10-16: 4 mg via INTRAVENOUS
  Filled 2024-10-16: qty 1

## 2024-10-16 MED ORDER — SODIUM CHLORIDE 0.9 % IV BOLUS
1000.0000 mL | Freq: Once | INTRAVENOUS | Status: AC
  Administered 2024-10-16: 1000 mL via INTRAVENOUS

## 2024-10-16 MED ORDER — IOHEXOL 300 MG/ML  SOLN
100.0000 mL | Freq: Once | INTRAMUSCULAR | Status: AC | PRN
  Administered 2024-10-16: 100 mL via INTRAVENOUS

## 2024-10-16 MED ORDER — ONDANSETRON HCL 4 MG/2ML IJ SOLN
4.0000 mg | Freq: Once | INTRAMUSCULAR | Status: AC
  Administered 2024-10-16: 4 mg via INTRAVENOUS
  Filled 2024-10-16: qty 2

## 2024-10-16 NOTE — ED Notes (Signed)
 Bladder Scan 386 mL

## 2024-10-16 NOTE — ED Triage Notes (Signed)
 Pt arriving POV with abdominal pain and vomiting that began today. Pt has hx of dysphagia due to stroke.

## 2024-10-16 NOTE — ED Provider Notes (Signed)
 [place] EMERGENCY DEPARTMENT AT [facility] Provider Note   CSN: [id number] Arrival date & time: 10/16/24  1409     Patient presents with: Abdominal Pain and Emesis   Todd Estrada is a 63 y.o. 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  History is extremely limited as patient is nonverbal with expressive aphasia from previous CVA at baseline.  Responds to yes/no questions with nodding.  {Add pertinent medical, surgical, social history, OB history to HPI:[id number]}  Abdominal Pain Associated symptoms: vomiting   Emesis Associated symptoms: abdominal pain        Prior to Admission medications   Medication Sig Start Date End Date Taking? Authorizing Provider  albuterol  (PROVENTIL ) (2.5 MG/3ML) 0.083% nebulizer solution Take 3 mLs (2.5 mg total) by nebulization every 4 (four) hours as needed for wheezing or shortness of breath. 08/16/22   Lou Claretta HERO, MD  atorvastatin  (LIPITOR) 40 MG tablet Take 1 tablet (40 mg total) by mouth daily. 08/14/24 08/14/25  [name], [name], DO  diclofenac  Sodium (VOLTAREN ) 1 % GEL Apply 2 g topically 4 (four) times daily. 10/08/24   Zheng, Michael, DO  enoxaparin  (LOVENOX ) 100 MG/ML injection Inject 0.85 mLs (85 mg total) into the skin every 12 (twelve) hours. 08/14/24   [name], [name], DO  levETIRAcetam  (KEPPRA ) 500 MG tablet Take 1 tablet (500 mg total) by mouth 2 (two) times daily. Patient not taking: Reported on 08/05/2024 04/18/24   Whitfield Raisin, NP  lidocaine  (LIDODERM ) 5 % Place 1 patch onto the skin daily. Remove & Discard patch within 12 hours or as directed by MD 08/14/24   [name], [name], DO  Multiple Vitamin (MULTIVITAMIN WITH MINERALS) TABS tablet Take 1 tablet by mouth daily.    [provider]  pantoprazole  (PROTONIX )  40 MG tablet TAKE 1 TABLET(40 MG) BY MOUTH DAILY 07/08/24   Tawkaliyar, Roya, DO  sucralfate  (CARAFATE ) 1 g tablet TAKE 1 TABLET(1 GRAM) BY MOUTH FOUR TIMES DAILY( WITH MEALS AND AT BEDTIME) 09/19/24   Tobie Gaines, DO  Tiotropium Bromide-Olodaterol (STIOLTO RESPIMAT ) 2.5-2.5 MCG/ACT AERS NEW PRESCRIPTION REQUEST: STIOLTO 2.5 MCG- INHALE TWO PUFFS BY MOUTH DAILY 08/14/24   [name], [name], DO    Allergies: Patient has no known allergies.    Review of Systems  Gastrointestinal:  Positive for abdominal pain and vomiting.    Updated Vital Signs BP 128/84   Pulse 76   Temp 98.7 F (37.1 C)   Resp 16   SpO2 98%   Physical Exam Vitals and nursing note reviewed.  Constitutional:      General: He is not in acute distress.    Appearance: Normal appearance. He is not ill-appearing.  HENT:     Head: Normocephalic and atraumatic.  Eyes:     Conjunctiva/sclera: Conjunctivae normal.  Cardiovascular:     Rate and Rhythm: Normal rate.  Pulmonary:     Effort: Pulmonary effort is normal. No respiratory distress.  Abdominal:     Tenderness: There is generalized abdominal tenderness.  Skin:    Coloration: Skin is not jaundiced or pale.  Neurological:     Mental Status: He is alert. Mental status is at baseline.     Comments: Expressive aphasia, nonverbal other than grunts, mumbling at baseline. Responds to yes/no questions with nodding.     (all labs ordered are listed, but only abnormal  results are displayed) Labs Reviewed  COMPREHENSIVE METABOLIC PANEL WITH GFR - Abnormal; Notable for the following components:      Result Value   Glucose, Bld 104 (*)    Total Protein 8.3 (*)    AST 88 (*)    ALT 113 (*)    Alkaline Phosphatase 142 (*)    Anion gap 18 (*)    All other components within normal limits  CBC - Abnormal; Notable for the following components:   MCHC 29.8 (*)    All other components within normal limits  LIPASE, BLOOD  URINALYSIS, ROUTINE W REFLEX MICROSCOPIC     EKG: None  Radiology: CT ABDOMEN PELVIS W CONTRAST Result Date: 10/16/2024 CLINICAL DATA:  Abdominal pain and vomiting beginning today. History of stage IV lung cancer. EXAM: CT ABDOMEN AND PELVIS WITH CONTRAST TECHNIQUE: Multidetector CT imaging of the abdomen and pelvis was performed using the standard protocol following bolus administration of intravenous contrast. RADIATION DOSE REDUCTION: This exam was performed according to the departmental dose-optimization program which includes automated exposure control, adjustment of the mA and/or kV according to patient size and/or use of iterative reconstruction technique. CONTRAST:  OMNIPAQUE  IOHEXOL  300 MG/ML  SOLN COMPARISON:  09/25/2024 and 10/09/2024 FINDINGS: Lower chest: Heart is normal size. Calcification over the right coronary artery. Calcification over the descending thoracic aorta. Elevation of the left hemidiaphragm unchanged. Visualized lung bases without acute disease. Hepatobiliary: Prior cholecystectomy. Liver and biliary tree are normal. Pancreas: Normal. Spleen: Normal. Adrenals/Urinary Tract: Adrenal glands are normal. Kidneys are normal in size without hydronephrosis or focal mass. Several calcifications over the renal hilum likely vascular. Ureters and bladder are normal. Stomach/Bowel: Stomach and small bowel are normal. Appendix is normal. Minimal diverticulosis of the colon which is otherwise unremarkable. Vascular/Lymphatic: Moderate calcified plaque over the abdominal aorta which is normal in caliber. 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 No adenopathy. Reproductive: Prostate is unremarkable. Other: Midline ventral hernia over the epigastric region containing only peritoneal fat unchanged. No free fluid or focal inflammatory change within the abdomen/pelvis. Findings suggesting injection granulomas  over the anterior abdominal wall unchanged. Musculoskeletal: No focal abnormality. IMPRESSION: 1. No acute findings in the abdomen/pelvis. No metastatic disease within the abdomen/pelvis. 2. Minimal colonic diverticulosis. 3. Aortic atherosclerosis. 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 4. Midline ventral hernia over the epigastric region containing only peritoneal fat unchanged. Aortic Atherosclerosis (ICD10-I70.0). Electronically Signed   By: Toribio Agreste M.D.   On: 10/16/2024 18:50      Medications Ordered in the ED  morphine  (PF) 4 MG/ML injection 4 mg (4 mg Intravenous Given 10/16/24 1731)  ondansetron  (ZOFRAN ) injection 4 mg (4 mg Intravenous Given 10/16/24 1732)  iohexol  (OMNIPAQUE ) 300 MG/ML solution 100 mL (100 mLs Intravenous Contrast Given 10/16/24 1828)  sodium chloride  0.9 % bolus 1,000 mL (1,000 mLs Intravenous New Bag/Given 10/16/24 2157)    Clinical Course as of 10/16/24 2245  Wed Oct 16, 2024  2238 Anion gap(!): 18 Mildly elevated anion gap likely related to pain versus nausea as well as elevated ALP.  No signs of DKA. Provided IVF for mildly elevated AG [LB]    Clinical Course User Index [LB] Minnie Tinnie BRAVO, PA   {Click here for ABCD2, HEART and other calculators REFRESH Note before signing:1}  Medical Decision Making Amount and/or Complexity of Data Reviewed Labs: ordered. Decision-making details documented in ED Course. Radiology: ordered.  Risk Prescription drug management.     Patient presents to the ED for concern of abd pain, NV, this involves an extensive number of treatment options, and is a complaint that carries with it a high risk of complications and morbidity.  The differential diagnosis includes gastroenteritis, diverticulitis, suction, perforation, pancreatitis, appendicitis, gastroparesis,  gastroenteritis   Co morbidities that complicate the patient evaluation  See HPI   Additional history obtained:  Additional history obtained from *** {Blank multiple:19196::EMS,Family,Nursing,Outside Medical Records,Past Admission}   External records from outside source obtained and reviewed including triage note   Lab Tests:  I Ordered, and personally interpreted labs.  The pertinent results include:   No leukocytosis AST 88 ALT 113 ALP 142 AG 18   Imaging Studies ordered:  I ordered imaging studies including CT abd pelvis w con  I independently visualized and interpreted imaging which showed no acute intra-abdominal abnormalities I agree with the radiologist interpretation     Medicines ordered and prescription drug management:  I ordered medication including morphine , zofran , ivf  for pain, NV, hydration  Reevaluation of the patient after these medicines showed that the patient improved I have reviewed the patients home medicines and have made adjustments as needed     Problem List / ED Course:  Abd pain No leukocytosis. No sepsis CT wo acute abnormalities Does have mild transaminitis which he has had mildly elevated in past No abnormalities to liver nor GB on CT AG mildly elevated which may be related to ETOH with acutely elevated liver enzymes. Provided IVF for hydration. No signs of DKA. CBG 104. Does not take SGLT2 so low suspicion for euglycemic DKA.  Discussed avoiding tylenol , etoh over next week to avoid worsening LFTs and to trend with PCP Passes po challenge  Vomiting K+ 3.6 No vomiting for entirety of ED visit >8hrs Passes PO challenge   Reevaluation:  After the interventions noted above, I reevaluated the patient and found that they have :improved    Dispostion:  After consideration of the diagnostic results and the patients response to treatment, I feel that the patent would benefit from inpatient management with PCP  follow-up.   Discussed ED workup, disposition, return to ED precautions with patient who expresses understanding agrees with plan.  All questions answered to their satisfaction.  They are agreeable to plan.  Discharge instructions provided on paperwork  Final diagnoses:  None    ED Discharge Orders     None

## 2024-10-16 NOTE — ED Notes (Signed)
 PT to CT.

## 2024-10-17 LAB — URINALYSIS, ROUTINE W REFLEX MICROSCOPIC
Bilirubin Urine: NEGATIVE
Glucose, UA: NEGATIVE mg/dL
Hgb urine dipstick: NEGATIVE
Ketones, ur: NEGATIVE mg/dL
Leukocytes,Ua: NEGATIVE
Nitrite: NEGATIVE
Protein, ur: NEGATIVE mg/dL
Specific Gravity, Urine: 1.036 — ABNORMAL HIGH (ref 1.005–1.030)
pH: 6 (ref 5.0–8.0)

## 2024-10-17 MED ORDER — ONDANSETRON 4 MG PO TBDP: 4.0000 mg | ORAL_TABLET | Freq: Three times a day (TID) | ORAL | 0 refills | Status: AC | PRN

## 2024-10-17 NOTE — Discharge Instructions (Addendum)
 Thank you for letting us  evaluate you today.  Your CT imaging does not show any acute abdominal abnormalities.  There are no kidney stones, infection to urinary tract.  Your lab work does show mildly elevated liver enzymes.  Please avoid Tylenol , alcohol use over the next couple weeks and trend these With your primary care provider with repeat labs in next 1-2 weeks. Zofran  to your pharmacy for antinausea, antivomiting medicine.  Have also provided you with GI/belly doctor follow-up for chronic abdominal pain.  Follow-up with them for further management  Return to Emergency Department if you experience intractable vomiting, worsening abdominal pain

## 2024-10-28 ENCOUNTER — Inpatient Hospital Stay: Attending: Internal Medicine

## 2024-10-28 ENCOUNTER — Ambulatory Visit (HOSPITAL_COMMUNITY)
Admission: RE | Admit: 2024-10-28 | Discharge: 2024-10-28 | Disposition: A | Discharge status: Home / Self Care | Source: Ambulatory Visit | Attending: Internal Medicine | Admitting: Internal Medicine

## 2024-10-28 DIAGNOSIS — C3411 Malignant neoplasm of upper lobe, right bronchus or lung: Secondary | ICD-10-CM | POA: Insufficient documentation

## 2024-10-28 DIAGNOSIS — Z79899 Other long term (current) drug therapy: Secondary | ICD-10-CM | POA: Insufficient documentation

## 2024-10-28 DIAGNOSIS — C3412 Malignant neoplasm of upper lobe, left bronchus or lung: Secondary | ICD-10-CM | POA: Insufficient documentation

## 2024-10-28 DIAGNOSIS — C3490 Malignant neoplasm of unspecified part of unspecified bronchus or lung: Secondary | ICD-10-CM | POA: Insufficient documentation

## 2024-10-28 DIAGNOSIS — Z87891 Personal history of nicotine dependence: Secondary | ICD-10-CM | POA: Diagnosis not present

## 2024-10-28 DIAGNOSIS — R0789 Other chest pain: Secondary | ICD-10-CM | POA: Diagnosis not present

## 2024-10-28 LAB — CBC WITH DIFFERENTIAL (CANCER CENTER ONLY)
Abs Immature Granulocytes: 0 K/uL (ref 0.00–0.07)
Basophils Absolute: 0 K/uL (ref 0.0–0.1)
Basophils Relative: 0 %
Eosinophils Absolute: 0.1 K/uL (ref 0.0–0.5)
Eosinophils Relative: 1 %
HCT: 42.4 % (ref 39.0–52.0)
Hemoglobin: 14 g/dL (ref 13.0–17.0)
Immature Granulocytes: 0 %
Lymphocytes Relative: 13 %
Lymphs Abs: 0.8 K/uL (ref 0.7–4.0)
MCH: 30 pg (ref 26.0–34.0)
MCHC: 33 g/dL (ref 30.0–36.0)
MCV: 91 fL (ref 80.0–100.0)
Monocytes Absolute: 0.6 K/uL (ref 0.1–1.0)
Monocytes Relative: 9 %
Neutro Abs: 4.7 K/uL (ref 1.7–7.7)
Neutrophils Relative %: 77 %
Platelet Count: 150 K/uL (ref 150–400)
RBC: 4.66 MIL/uL (ref 4.22–5.81)
RDW: 13.3 % (ref 11.5–15.5)
WBC Count: 6.1 K/uL (ref 4.0–10.5)
nRBC: 0 % (ref 0.0–0.2)

## 2024-10-28 LAB — CMP (CANCER CENTER ONLY)
ALT: 20 U/L (ref 0–44)
AST: 17 U/L (ref 15–41)
Albumin: 4.4 g/dL (ref 3.5–5.0)
Alkaline Phosphatase: 112 U/L (ref 38–126)
Anion gap: 6 (ref 5–15)
BUN: 17 mg/dL (ref 8–23)
CO2: 33 mmol/L — ABNORMAL HIGH (ref 22–32)
Calcium: 9.9 mg/dL (ref 8.9–10.3)
Chloride: 104 mmol/L (ref 98–111)
Creatinine: 0.96 mg/dL (ref 0.61–1.24)
GFR, Estimated: >60 mL/min (ref >60)
Glucose, Bld: 88 mg/dL (ref 70–99)
Potassium: 4.5 mmol/L (ref 3.5–5.1)
Sodium: 143 mmol/L (ref 135–145)
Total Bilirubin: 0.5 mg/dL (ref 0.0–1.2)
Total Protein: 7.7 g/dL (ref 6.5–8.1)

## 2024-10-28 MED ORDER — IOHEXOL 300 MG/ML  SOLN
75.0000 mL | Freq: Once | INTRAMUSCULAR | Status: AC | PRN
  Administered 2024-10-28: 75 mL via INTRAVENOUS

## 2024-10-28 MED ORDER — SODIUM CHLORIDE (PF) 0.9 % IJ SOLN
INTRAMUSCULAR | Status: AC
  Filled 2024-10-28: qty 50

## 2024-11-04 ENCOUNTER — Inpatient Hospital Stay (HOSPITAL_BASED_OUTPATIENT_CLINIC_OR_DEPARTMENT_OTHER): Admitting: Internal Medicine

## 2024-11-04 VITALS — BP 140/78 | HR 83 | Temp 98.0°F | Resp 17 | Ht 72.0 in | Wt 199.0 lb

## 2024-11-04 DIAGNOSIS — C3490 Malignant neoplasm of unspecified part of unspecified bronchus or lung: Secondary | ICD-10-CM

## 2024-11-04 DIAGNOSIS — C3411 Malignant neoplasm of upper lobe, right bronchus or lung: Secondary | ICD-10-CM | POA: Diagnosis not present

## 2024-11-04 NOTE — Progress Notes (Signed)
 [facility] [phone]   [phone]  OFFICE PROGRESS NOTE  Todd Estrada, [name], DO [address] [place] [address]  DIAGNOSIS:  Stage IIb (T2 a, N1, M0) non-small cell lung cancer, squamous cell carcinoma presented with left upper lobe lung mass in addition to left hilar adenopathy diagnosed in June 2023.  The patient also has synchronous stage Ia (T1c, N0, M0) non-small cell lung cancer, squamous cell carcinoma presented with right upper lobe lung nodule diagnosed in June 2023. PD-L1 expression was 20%.  PRIOR THERAPY: Concurrent chemoradiation with weekly carboplatin  for AUC of 2 and paclitaxel  45 Mg/M2.  First dose 07/04/2022.  Status post post 5 cycles.  Last cycle was given August 01, 2022 with partial response.  The patient was lost to follow-up after that cycle.  CURRENT THERAPY: Observation.  INTERVAL HISTORY: [name] 63 y.o. male returns to the clinic today for follow-up visit accompanied by his cousin.Discussed the use of AI scribe software for clinical note transcription with the patient, who gave verbal consent to proceed.  History of Present Illness [name] is a 63 year old male who presents for evaluation with repeat CT scan of the chest for restaging of his disease. He is accompanied by his cousin.  He has been experiencing chest pain, described as a 'hurting' sensation, which began a couple of months after completing radiation therapy. The pain is localized to the right side of his chest.  He is currently under observation following treatment for a right upper lobe nodule, having undergone radiation therapy. His management includes monitoring with repeat CT scans to assess the status of his disease.  No new symptoms or issues related to his cancer are reported.    MEDICAL HISTORY: Past Medical History:  Diagnosis Date   Asthma    Atypical chest pain 08/13/2022   Community acquired pneumonia 09/14/2022    COPD (chronic obstructive pulmonary disease) (HCC)    Essential hypertension 08/19/2021   GERD (gastroesophageal reflux disease)    HAP (hospital-acquired pneumonia) 09/16/2022   History of tracheostomy    03/09/22-04/11/22   HLD (hyperlipidemia)    Hypertension    Hypokalemia 08/13/2022   Hypomagnesemia 08/13/2022   Lung cancer (HCC)    PAD (peripheral artery disease)    Seizures (HCC) 06/02/2022   Sepsis (HCC) 08/13/2022   Sepsis due to pneumonia (HCC) 04/13/2023   Stroke (HCC) 02/2022    ALLERGIES:  has no known allergies.  MEDICATIONS:  Current Outpatient Medications  Medication Sig Dispense Refill   albuterol  (PROVENTIL ) (2.5 MG/3ML) 0.083% nebulizer solution Take 3 mLs (2.5 mg total) by nebulization every 4 (four) hours as needed for wheezing or shortness of breath. 90 mL 12   atorvastatin  (LIPITOR) 40 MG tablet Take 1 tablet (40 mg total) by mouth daily. 90 tablet 3   diclofenac  Sodium (VOLTAREN ) 1 % GEL Apply 2 g topically 4 (four) times daily. 300 g 3   enoxaparin  (LOVENOX ) 100 MG/ML injection Inject 0.85 mLs (85 mg total) into the skin every 12 (twelve) hours. 51 mL 2   levETIRAcetam  (KEPPRA ) 500 MG tablet Take 1 tablet (500 mg total) by mouth 2 (two) times daily. (Patient not taking: Reported on 08/05/2024) 180 tablet 0   lidocaine  (LIDODERM ) 5 % Place 1 patch onto the skin daily. Remove & Discard patch within 12 hours or as directed by MD 30 patch 0   Multiple Vitamin (MULTIVITAMIN WITH MINERALS) TABS tablet Take 1 tablet  by mouth daily.     ondansetron  (ZOFRAN -ODT) 4 MG disintegrating tablet Take 1 tablet (4 mg total) by mouth every 8 (eight) hours as needed for nausea or vomiting. 20 tablet 0   pantoprazole  (PROTONIX ) 40 MG tablet TAKE 1 TABLET(40 MG) BY MOUTH DAILY 90 tablet 2   sucralfate  (CARAFATE ) 1 g tablet TAKE 1 TABLET(1 GRAM) BY MOUTH FOUR TIMES DAILY( WITH MEALS AND AT BEDTIME) 360 tablet 1   Tiotropium Bromide-Olodaterol (STIOLTO RESPIMAT ) 2.5-2.5 MCG/ACT AERS  NEW PRESCRIPTION REQUEST: STIOLTO 2.5 MCG- INHALE TWO PUFFS BY MOUTH DAILY 12 g 3   No current facility-administered medications for this visit.    SURGICAL HISTORY:  Past Surgical History:  Procedure Laterality Date   BRONCHIAL BIOPSY  05/30/2022   Procedure: BRONCHIAL BIOPSIES;  Surgeon: Shelah Lamar RAMAN, MD;  Location: [facility] ENDOSCOPY;  Service: Pulmonary;;   BRONCHIAL BRUSHINGS  05/30/2022   Procedure: BRONCHIAL BRUSHINGS;  Surgeon: Shelah Lamar RAMAN, MD;  Location: [facility] ENDOSCOPY;  Service: Pulmonary;;   BRONCHIAL NEEDLE ASPIRATION BIOPSY  05/30/2022   Procedure: BRONCHIAL NEEDLE ASPIRATION BIOPSIES;  Surgeon: Shelah Lamar RAMAN, MD;  Location: [facility] ENDOSCOPY;  Service: Pulmonary;;   BRONCHIAL WASHINGS  05/30/2022   Procedure: BRONCHIAL WASHINGS;  Surgeon: Shelah Lamar RAMAN, MD;  Location: [facility] ENDOSCOPY;  Service: Pulmonary;;   CHOLECYSTECTOMY N/A 07/05/2023   Procedure: LAPAROSCOPIC CHOLECYSTECTOMY WITH INTRAOPERATIVE CHOLANGIOGRAM;  Surgeon: Sheldon Standing, MD;  Location: [facility] ORS;  Service: General;  Laterality: N/A;   ERCP N/A 07/06/2023   Procedure: ENDOSCOPIC RETROGRADE CHOLANGIOPANCREATOGRAPHY (ERCP);  Surgeon: Rosalie Kitchens, MD;  Location: [facility]COPY;  Service: Gastroenterology;  Laterality: N/A;   ESOPHAGOGASTRODUODENOSCOPY (EGD) WITH PROPOFOL  N/A 03/11/2022   Procedure: ESOPHAGOGASTRODUODENOSCOPY (EGD) WITH PROPOFOL ;  Surgeon: Sebastian Moles, MD;  Location: [facility] ENDOSCOPY;  Service: General;  Laterality: N/A;   FIDUCIAL MARKER PLACEMENT  05/30/2022   Procedure: FIDUCIAL MARKER PLACEMENT;  Surgeon: Shelah Lamar RAMAN, MD;  Location: [facility] ENDOSCOPY;  Service: Pulmonary;;   IR ANGIO INTRA EXTRACRAN SEL COM CAROTID INNOMINATE UNI R MOD SED  03/02/2022   IR CT HEAD LTD  03/02/2022   IR PERCUTANEOUS ART THROMBECTOMY/INFUSION INTRACRANIAL INC DIAG ANGIO  03/02/2022   PEG PLACEMENT N/A 03/11/2022   Procedure: PERCUTANEOUS ENDOSCOPIC GASTROSTOMY (PEG) PLACEMENT;  Surgeon: Sebastian Moles, MD;  Location: [facility] ENDOSCOPY;   Service: General;  Laterality: N/A;   RADIOLOGY WITH ANESTHESIA N/A 03/02/2022   Procedure: IR WITH ANESTHESIA;  Surgeon: Dolphus Carrion, MD;  Location: [facility] OR;  Service: Radiology;  Laterality: N/A;   REMOVAL OF STONES  07/06/2023   Procedure: REMOVAL OF STONES;  Surgeon: Rosalie Kitchens, MD;  Location: [facility] ENDOSCOPY;  Service: Gastroenterology;;   ANNETT  07/06/2023   Procedure: ANNETT;  Surgeon: Rosalie Kitchens, MD;  Location: [facility] ENDOSCOPY;  Service: Gastroenterology;;   VIDEO BRONCHOSCOPY WITH RADIAL ENDOBRONCHIAL ULTRASOUND  05/30/2022   Procedure: VIDEO BRONCHOSCOPY WITH RADIAL ENDOBRONCHIAL ULTRASOUND;  Surgeon: Shelah Lamar RAMAN, MD;  Location: [facility] ENDOSCOPY;  Service: Pulmonary;;    REVIEW OF SYSTEMS:  A comprehensive review of systems was negative except for: Constitutional: positive for fatigue Respiratory: positive for pleurisy/chest pain   PHYSICAL EXAMINATION: General appearance: alert, cooperative, fatigued, and no distress Head: Normocephalic, without obvious abnormality, atraumatic Neck: no adenopathy, no JVD, supple, symmetrical, trachea midline, and thyroid not enlarged, symmetric, no tenderness/mass/nodules Lymph nodes: Cervical, supraclavicular, and axillary nodes normal. Resp: clear to auscultation bilaterally Back: symmetric, no curvature. ROM normal. No CVA tenderness. Cardio: regular rate and rhythm, S1, S2 normal, no murmur, click, rub or gallop  GI: soft, non-tender; bowel sounds normal; no masses,  no organomegaly Extremities: extremities normal, atraumatic, no cyanosis or edema  ECOG PERFORMANCE STATUS: 1 - Symptomatic but completely ambulatory  Blood pressure (!) 140/78, pulse 83, temperature 98 F (36.7 C), temperature source Temporal, resp. rate 17, height 6' (1.829 m), weight 199 lb (90.3 kg), SpO2 98%.  LABORATORY DATA: Lab Results  Component Value Date   WBC 6.1 10/28/2024   HGB 14.0 10/28/2024   HCT 42.4 10/28/2024   MCV 91.0 10/28/2024   PLT  150 10/28/2024      Chemistry      Component Value Date/Time   NA 143 10/28/2024 1431   NA 138 07/05/2024 1132   K 4.5 10/28/2024 1431   CL 104 10/28/2024 1431   CO2 33 (H) 10/28/2024 1431   BUN 17 10/28/2024 1431   BUN 19 07/05/2024 1132   CREATININE 0.96 10/28/2024 1431      Component Value Date/Time   CALCIUM  9.9 10/28/2024 1431   ALKPHOS 112 10/28/2024 1431   AST 17 10/28/2024 1431   ALT 20 10/28/2024 1431   BILITOT 0.5 10/28/2024 1431       RADIOGRAPHIC STUDIES: CT Chest W Contrast Result Date: 10/28/2024 CLINICAL DATA:  Non-small cell lung cancer, staging. * Tracking Code: BO * EXAM: CT CHEST WITH CONTRAST TECHNIQUE: Multidetector CT imaging of the chest was performed during intravenous contrast administration. RADIATION DOSE REDUCTION: This exam was performed according to the departmental dose-optimization program which includes automated exposure control, adjustment of the mA and/or kV according to patient size and/or use of iterative reconstruction technique. CONTRAST:  75mL OMNIPAQUE  IOHEXOL  300 MG/ML  SOLN COMPARISON:  10/09/2024 FINDINGS: Cardiovascular: Atherosclerotic calcification of the aorta, aortic valve and coronary arteries. Heart size normal. No pericardial effusion. Mediastinum/Nodes: No pathologically enlarged mediastinal or axillary lymph nodes. Hilar regions are difficult to definitively evaluate without IV contrast. Esophagus is grossly unremarkable. Lungs/Pleura: Centrilobular and paraseptal emphysema. Masslike consolidation in the medial lingula measures approximately 2.6 x 3.3 cm (5/71), similar to 10/09/2024. Surrounding architectural distortion and parenchymal retraction, indicative of radiation therapy. Additional parenchymal retraction and architectural distortion centered on a fiducial marker in the lateral right upper lobe, also indicative of radiation therapy. Calcified granulomas. Scarring in the lung bases. No pleural fluid. Minimal debris in the  airway. Upper Abdomen: Visualized portions of the liver, adrenal glands, kidneys, spleen, pancreas, stomach and bowel are grossly unremarkable. No upper abdominal adenopathy. Musculoskeletal: Degenerative changes in the spine. Old lateral right rib fracture. No worrisome lytic or sclerotic lesions. IMPRESSION: 1. Medial left upper lobe mass is stable from 10/09/2024. Surrounding changes of radiation therapy. No evidence of disease progression. 2. Additional changes of radiation therapy in the lateral right upper lobe. 3. Aortic atherosclerosis (ICD10-I70.0). Coronary artery calcification. 4.  Emphysema (ICD10-J43.9). Electronically Signed   By: Newell Eke M.D.   On: 10/28/2024 16:23   CT ABDOMEN PELVIS W CONTRAST Result Date: 10/16/2024 CLINICAL DATA:  Abdominal pain and vomiting beginning today. History of stage IV lung cancer. EXAM: CT ABDOMEN AND PELVIS WITH CONTRAST TECHNIQUE: Multidetector CT imaging of the abdomen and pelvis was performed using the standard protocol following bolus administration of intravenous contrast. RADIATION DOSE REDUCTION: This exam was performed according to the departmental dose-optimization program which includes automated exposure control, adjustment of the mA and/or kV according to patient size and/or use of iterative reconstruction technique. CONTRAST:  OMNIPAQUE  IOHEXOL  300 MG/ML  SOLN COMPARISON:  09/25/2024 and 10/09/2024 FINDINGS: Lower chest: Heart is normal  size. Calcification over the right coronary artery. Calcification over the descending thoracic aorta. Elevation of the left hemidiaphragm unchanged. Visualized lung bases without acute disease. Hepatobiliary: Prior cholecystectomy. Liver and biliary tree are normal. Pancreas: Normal. Spleen: Normal. Adrenals/Urinary Tract: Adrenal glands are normal. Kidneys are normal in size without hydronephrosis or focal mass. Several calcifications over the renal hilum likely vascular. Ureters and bladder are normal.  Stomach/Bowel: Stomach and small bowel are normal. Appendix is normal. Minimal diverticulosis of the colon which is otherwise unremarkable. Vascular/Lymphatic: Moderate calcified plaque over the abdominal aorta which is normal in caliber. 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 No adenopathy. Reproductive: Prostate is unremarkable. Other: Midline ventral hernia over the epigastric region containing only peritoneal fat unchanged. No free fluid or focal inflammatory change within the abdomen/pelvis. Findings suggesting injection granulomas over the anterior abdominal wall unchanged. Musculoskeletal: No focal abnormality. IMPRESSION: 1. No acute findings in the abdomen/pelvis. No metastatic disease within the abdomen/pelvis. 2. Minimal colonic diverticulosis. 3. Aortic atherosclerosis. 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 4. Midline ventral hernia over the epigastric region containing only peritoneal fat unchanged. Aortic Atherosclerosis (ICD10-I70.0). Electronically Signed   By: Toribio Agreste M.D.   On: 10/16/2024 18:50   CT ABDOMEN PELVIS W CONTRAST Result Date: 10/09/2024 CLINICAL DATA:  Acute pancreatitis, chest pain, history of stage IV lung cancer EXAM: CT ANGIOGRAPHY CHEST CT ABDOMEN AND PELVIS WITH CONTRAST TECHNIQUE: Multidetector CT imaging of the chest was performed using the standard protocol during bolus administration of intravenous contrast. Multiplanar CT image reconstructions and MIPs were obtained to evaluate the vascular anatomy. Multidetector CT imaging of the abdomen and pelvis was performed using the standard protocol during bolus administration of intravenous contrast. RADIATION DOSE REDUCTION: This exam was performed according to the  departmental dose-optimization program which includes automated exposure control, adjustment of the mA and/or kV according to patient size and/or use of iterative reconstruction technique. CONTRAST:  75mL OMNIPAQUE  IOHEXOL  350 MG/ML SOLN COMPARISON:  10/09/2024, 09/25/2024, 07/29/2024 FINDINGS: CTA CHEST FINDINGS Cardiovascular: This is a technically adequate evaluation of the pulmonary vasculature. There are no filling defects or pulmonary emboli. There is occlusion of a segmental branch of the left upper lobe pulmonary artery due to the known lung cancer, similar to prior study. The heart is unremarkable without pericardial effusion. No evidence of thoracic aortic aneurysm or dissection. Atherosclerosis of the aorta and coronary vasculature again noted. Mediastinum/Nodes: No discrete pathologic mediastinal, hilar, or axillary adenopathy. Thyroid, trachea, and esophagus are stable. Lungs/Pleura: The left upper lobe mass seen on prior study is again identified, now measuring 3.5 x 2.9 cm, previously measuring 3.8 x 2.9 cm. There is increased surrounding lung consolidation, progressive irregularity of the margins with increasing interlobular septal thickening throughout the left upper lobe, as well as progressive mural thickening of the left upper lobe bronchi with progressive luminal narrowing. Overall, findings are consistent with progression of disease. Upper lobe predominant emphysema is again noted. No acute airspace disease, effusion, or pneumothorax. Stable bandlike scarring within the right upper lobe. Musculoskeletal: Stable chronic appearing right lateral fifth rib fracture. Reconstructed images demonstrate no additional findings. Review of the MIP images confirms the above findings. CT ABDOMEN and PELVIS FINDINGS Hepatobiliary: No focal liver abnormality is seen. Status post cholecystectomy. No biliary dilatation. Persistent trace pneumobilia, likely due  to prior sphincterotomy. Pancreas: Unremarkable. No  pancreatic ductal dilatation or surrounding inflammatory changes. Spleen: Normal in size without focal abnormality. Adrenals/Urinary Tract: Adrenal glands are unremarkable. Kidneys are normal, without renal calculi, focal lesion, or hydronephrosis. Stable renal hilar vascular calcifications. Bladder is unremarkable. Stomach/Bowel: No bowel obstruction or ileus. Normal appendix right lower quadrant. No bowel wall thickening or inflammatory change. Vascular/Lymphatic: Aortic atherosclerosis. No enlarged abdominal or pelvic lymph nodes. Stable multifocal high-grade stenoses of the bilateral common iliac arteries, left greater than right. Reproductive: Prostate is unremarkable. Other: No free fluid or free intraperitoneal gas. No abdominal wall hernia. Musculoskeletal: No acute or destructive bony abnormalities. Reconstructed images demonstrate no additional findings. Review of the MIP images confirms the above findings. IMPRESSION: Chest: 1. No evidence of pulmonary embolus. 2. Left upper lobe mass compatible with known history of lung cancer. While the mass itself does not measures significantly different than prior study, there is increased surrounding consolidation, worsening interlobular septal thickening, and progressive wall thickening of the left upper lobe bronchial tree and luminal narrowing concerning for progression of disease. 3. Aortic Atherosclerosis (ICD10-I70.0) and Emphysema (ICD10-J43.9). Abdomen/pelvis: 1. No acute intra-abdominal or intrapelvic process. No evidence of subdiaphragmatic metastases. 2. Aortic Atherosclerosis (ICD10-I70.0). Stable multifocal high-grade stenoses of the bilateral common iliac arteries. Electronically Signed   By: Ozell Daring M.D.   On: 10/09/2024 15:52   CT Angio Chest PE W and/or Wo Contrast Result Date: 10/09/2024 CLINICAL DATA:  Acute pancreatitis, chest pain, history of stage IV lung cancer EXAM: CT ANGIOGRAPHY CHEST CT ABDOMEN AND PELVIS WITH CONTRAST  TECHNIQUE: Multidetector CT imaging of the chest was performed using the standard protocol during bolus administration of intravenous contrast. Multiplanar CT image reconstructions and MIPs were obtained to evaluate the vascular anatomy. Multidetector CT imaging of the abdomen and pelvis was performed using the standard protocol during bolus administration of intravenous contrast. RADIATION DOSE REDUCTION: This exam was performed according to the departmental dose-optimization program which includes automated exposure control, adjustment of the mA and/or kV according to patient size and/or use of iterative reconstruction technique. CONTRAST:  75mL OMNIPAQUE  IOHEXOL  350 MG/ML SOLN COMPARISON:  10/09/2024, 09/25/2024, 07/29/2024 FINDINGS: CTA CHEST FINDINGS Cardiovascular: This is a technically adequate evaluation of the pulmonary vasculature. There are no filling defects or pulmonary emboli. There is occlusion of a segmental branch of the left upper lobe pulmonary artery due to the known lung cancer, similar to prior study. The heart is unremarkable without pericardial effusion. No evidence of thoracic aortic aneurysm or dissection. Atherosclerosis of the aorta and coronary vasculature again noted. Mediastinum/Nodes: No discrete pathologic mediastinal, hilar, or axillary adenopathy. Thyroid, trachea, and esophagus are stable. Lungs/Pleura: The left upper lobe mass seen on prior study is again identified, now measuring 3.5 x 2.9 cm, previously measuring 3.8 x 2.9 cm. There is increased surrounding lung consolidation, progressive irregularity of the margins with increasing interlobular septal thickening throughout the left upper lobe, as well as progressive mural thickening of the left upper lobe bronchi with progressive luminal narrowing. Overall, findings are consistent with progression of disease. Upper lobe predominant emphysema is again noted. No acute airspace disease, effusion, or pneumothorax. Stable bandlike  scarring within the right upper lobe. Musculoskeletal: Stable chronic appearing right lateral fifth rib fracture. Reconstructed images demonstrate no additional findings. Review of the MIP images confirms the above findings. CT ABDOMEN and PELVIS FINDINGS Hepatobiliary: No focal liver abnormality is seen. Status post cholecystectomy. No biliary dilatation. Persistent trace pneumobilia, likely due to prior sphincterotomy. Pancreas: Unremarkable.  No pancreatic ductal dilatation or surrounding inflammatory changes. Spleen: Normal in size without focal abnormality. Adrenals/Urinary Tract: Adrenal glands are unremarkable. Kidneys are normal, without renal calculi, focal lesion, or hydronephrosis. Stable renal hilar vascular calcifications. Bladder is unremarkable. Stomach/Bowel: No bowel obstruction or ileus. Normal appendix right lower quadrant. No bowel wall thickening or inflammatory change. Vascular/Lymphatic: Aortic atherosclerosis. No enlarged abdominal or pelvic lymph nodes. Stable multifocal high-grade stenoses of the bilateral common iliac arteries, left greater than right. Reproductive: Prostate is unremarkable. Other: No free fluid or free intraperitoneal gas. No abdominal wall hernia. Musculoskeletal: No acute or destructive bony abnormalities. Reconstructed images demonstrate no additional findings. Review of the MIP images confirms the above findings. IMPRESSION: Chest: 1. No evidence of pulmonary embolus. 2. Left upper lobe mass compatible with known history of lung cancer. While the mass itself does not measures significantly different than prior study, there is increased surrounding consolidation, worsening interlobular septal thickening, and progressive wall thickening of the left upper lobe bronchial tree and luminal narrowing concerning for progression of disease. 3. Aortic Atherosclerosis (ICD10-I70.0) and Emphysema (ICD10-J43.9). Abdomen/pelvis: 1. No acute intra-abdominal or intrapelvic process. No  evidence of subdiaphragmatic metastases. 2. Aortic Atherosclerosis (ICD10-I70.0). Stable multifocal high-grade stenoses of the bilateral common iliac arteries. Electronically Signed   By: Ozell Daring M.D.   On: 10/09/2024 15:52   DG Chest Portable 1 View Result Date: 10/09/2024 EXAM: 1 VIEW(S) XRAY OF THE CHEST 10/09/2024 02:21:00 PM COMPARISON: 09/25/2024 CLINICAL HISTORY: cp. Triage notes:Per [facility] pt coming from home- pt is nonverbal since having a stroke. Family called out states patient began c/o chest pain after having his morning meds. Patient has stage 4 lung cancer not currently receiving treatment. 20G L hand. Per ; EMS no aspirin  given due to hx of dysphagia  FINDINGS: LUNGS AND PLEURA: Stable bandlike scarring along lateral right mid lung and left mid lung compatible with changes secondary to external beam radiation. Stable left hilar mass corresponding to patient's known malignancy. Emphysema. No pulmonary edema. No pleural effusion. No pneumothorax. HEART AND MEDIASTINUM: Aortic arch atherosclerosis. No acute abnormality of the cardiac and mediastinal silhouettes. BONES AND SOFT TISSUES: No acute osseous abnormality. IMPRESSION: 1. No acute findings. 2. Stable left hilar mass corresponding to the patients known malignancy. 3. Bandlike opacities within bilateral mid lung zones are compatible with changes secondary to external beam radiation. Electronically signed by: Waddell Calk MD 10/09/2024 02:46 PM EDT RP Workstation: [id number]    ASSESSMENT AND PLAN: This is a very pleasant 63 years old white male with  stage IIb (T2 a, N1, M0) non-small cell lung cancer, squamous cell carcinoma presented with left upper lobe lung mass in addition to left hilar adenopathy diagnosed in June 2023.  The patient also has synchronous stage Ia (T1c, N0, M0) non-small cell lung cancer, squamous cell carcinoma presented with right upper lobe lung nodule diagnosed in June 2023. PD-L1 expression was 20%. The  patient underwent a course of concurrent chemoradiation with weekly carboplatin  for AUC of 2 and paclitaxel  45 Mg/M2 status post 5 cycles. Last dose was giving August 01 2022. He has been tolerating this treatment well with no concerning adverse effects. Unfortunately his scan showed progressive disease with pleural-based right lung mass and questionable chest wall invasion. He received palliative radiotherapy to this area.  Is currently on observation. He had repeat CT scan of the chest performed recently.  I personally and independently reviewed the scan and discussed the results with the patient and his cousin.  His scan  showed no concerning findings for disease patient. Assessment and Plan Assessment & Plan Malignant neoplasm of right upper lobe of lung, under observation The malignant neoplasm of the right upper lobe of the lung is under observation. Recent CT scan shows no concerning findings, and the cancer remains under control. - Continue observation with repeat CT scan of the chest every six months.  Chest pain after radiation therapy Chest pain began a couple of months after radiation therapy, likely due to muscle ache from the previous radiation port. Pain is localized to the right side and is not cardiac in origin. No new concerning findings on the scan. - Recommended Tylenol  for pain management. - Suggested use of a lidocaine  patch for severe pain. He was advised to call immediately if he has any other concerning symptoms in the interval.   The patient voices understanding of current disease status and treatment options and is in agreement with the current care plan.  All questions were answered. The patient knows to call the clinic with any problems, questions or concerns. We can certainly see the patient much sooner if necessary.  The total time spent in the appointment was 20 minutes.  Disclaimer: This note was dictated with voice recognition software. Similar sounding words  can inadvertently be transcribed and may not be corrected upon review.

## 2024-11-06 ENCOUNTER — Ambulatory Visit: Admitting: Internal Medicine

## 2024-11-06 NOTE — Progress Notes (Deleted)
 Cardiology Office Note:  .    Date:  11/06/2024  ID:  Todd Estrada, DOB 01-08-1961, MRN [id number] PCP: [name], [name], DO  [facility] Providers Cardiologist:  Stanly DELENA Leavens, MD { Click to update primary MD,subspecialty MD or APP then REFRESH:1}    CC: *** Consulted for the evaluation of *** at the behest of ***   History of Present Illness: .    Todd Estrada is a 63 y.o. male ***  Discussed the use of AI scribe software for clinical note transcription with the patient, who gave verbal consent to proceed.   non-small cell lung cancer, squamous cell carcinoma presented with left upper lobe lung mass   Paclitaxel   Chest pain after radiation therapy Chest pain began a couple of months after radiation therapy, likely due to muscle ache from the previous radiation port. Pain is localized to the right side and is not cardiac in origin. No new concerning findings on the scan. - Recommended Tylenol  for pain management. - Suggested use of a lidocaine  patch for severe pain. He was advised to call immediately if he has any other concerning symptoms in the interval.  Paroxysmal atrial fibrillation: 04/2024 episode of A-fib with RVR and based on EMR spontaneously converted to sinus rhythm. Currently not on AV nodal blocking agents due to soft blood pressures. Not an ideal candidate for antiarrhythmic medications. Currently on Eliquis  given his history of PE Has had history of stroke in the past. -NSR on EKG 06/01/24   Coronary calcification: Asymptomatic.  Would avoid aspirin  as long as he is on anticoagulation.  No workup indicated in an asymptomatic male for now.   Chronic hypotension: of midodrine . BP up initially here but came down and at home have been stable. PCP   Hyperlipidemia: Continue statin therapy Chest Radiation ***   History of COPD On inhalers   History of stroke:on eliquis      Relevant histories: .  Social  -established with me in  2024 -[facility] [facility]- me and Olivia Pavy ROS: As per HPI.   Studies Reviewed: .     Cardiac Studies & Procedures   ______________________________________________________________________________________________     ECHOCARDIOGRAM  ECHOCARDIOGRAM COMPLETE 07/04/2023  Narrative ECHOCARDIOGRAM REPORT    Patient Name:   Todd Estrada Date of Exam: 07/04/2023 Medical Rec #:  [id number]     Height:       72.0 in Accession #:    [id number]    Weight:       165.0 lb Date of Birth:  April 07, 1961     BSA:          1.963 m Patient Age:    62 years      BP:           119/73 mmHg Patient Gender: M             HR:           76 bpm. Exam Location:  Inpatient  Procedure: 2D Echo, Cardiac Doppler, Color Doppler and Intracardiac Opacification Agent  Indications:    CHF I50.9  History:        Patient has prior history of Echocardiogram examinations, most recent 10/13/2022. CHF, Stroke, PAD and Emphysema; Risk Factors:Dyslipidemia and Former Smoker.  Sonographer:    Tillman Nora RVT RCS Referring Phys: [phone] Lakshya MANUEL ORTIZ   Sonographer Comments: Technically challenging study due to limited acoustic windows, Technically difficult study due to poor echo windows, suboptimal parasternal window, suboptimal apical window and suboptimal  subcostal window. Technically difficult exam due to very poor and limited windows; attempted Definity . IMPRESSIONS   1. Left ventricular ejection fraction, by estimation, is 60 to 65%. The left ventricle has normal function. Left ventricular endocardial border not optimally defined to evaluate regional wall motion. There is mild left ventricular hypertrophy. Left ventricular diastolic parameters are consistent with Grade I diastolic dysfunction (impaired relaxation). 2. Right ventricular systolic function is mildly reduced. The right ventricular size is normal. 3. The mitral valve is normal in structure. Trivial mitral valve regurgitation. No evidence of mitral  stenosis. 4. The aortic valve was not well visualized. Aortic valve regurgitation is not visualized. No aortic stenosis is present. 5. Aortic dilatation noted. There is borderline dilatation of the ascending aorta, measuring 39 mm. 6. The inferior vena cava is normal in size with greater than 50% respiratory variability, suggesting right atrial pressure of 3 mmHg. 7. Technically very limited study with poor images even with Definity  contrast. LV function appears normal. Suspect RV function mildly decreased.  FINDINGS Left Ventricle: Left ventricular ejection fraction, by estimation, is 60 to 65%. The left ventricle has normal function. Left ventricular endocardial border not optimally defined to evaluate regional wall motion. Definity  contrast agent was given IV to delineate the left ventricular endocardial borders. The left ventricular internal cavity size was normal in size. There is mild left ventricular hypertrophy. Left ventricular diastolic parameters are consistent with Grade I diastolic dysfunction (impaired relaxation).  Right Ventricle: The right ventricular size is normal. No increase in right ventricular wall thickness. Right ventricular systolic function is mildly reduced.  Left Atrium: Left atrial size was normal in size.  Right Atrium: Right atrial size was normal in size.  Pericardium: There is no evidence of pericardial effusion.  Mitral Valve: The mitral valve is normal in structure. Trivial mitral valve regurgitation. No evidence of mitral valve stenosis.  Tricuspid Valve: The tricuspid valve is not well visualized. Tricuspid valve regurgitation is trivial. No evidence of tricuspid stenosis.  Aortic Valve: The aortic valve was not well visualized. Aortic valve regurgitation is not visualized. No aortic stenosis is present.  Pulmonic Valve: The pulmonic valve was not well visualized. Pulmonic valve regurgitation is not visualized. No evidence of pulmonic stenosis.  Aorta:  Aortic dilatation noted. There is borderline dilatation of the ascending aorta, measuring 39 mm.  Venous: The inferior vena cava is normal in size with greater than 50% respiratory variability, suggesting right atrial pressure of 3 mmHg.  IAS/Shunts: No atrial level shunt detected by color flow Doppler.  Additional Comments: A venous catheter is visualized.   LEFT VENTRICLE PLAX 2D LVIDd:         4.80 cm   Diastology LVIDs:         3.90 cm   LV e' medial:    8.16 cm/s LV PW:         1.10 cm   LV E/e' medial:  7.1 LV IVS:        1.10 cm   LV e' lateral:   10.00 cm/s LVOT diam:     2.20 cm   LV E/e' lateral: 5.8 LV SV:         63 LV SV Index:   32 LVOT Area:     3.80 cm   RIGHT VENTRICLE             IVC RV S prime:     12.90 cm/s  IVC diam: 1.20 cm TAPSE (M-mode): 2.8 cm  LEFT ATRIUM  Index LA diam:    3.60 cm 1.83 cm/m AORTIC VALVE             PULMONIC VALVE LVOT Vmax:   84.20 cm/s  PV Vmax:       0.90 m/s LVOT Vmean:  61.900 cm/s PV Peak grad:  3.3 mmHg LVOT VTI:    0.165 m  AORTA Ao Root diam: 3.50 cm Ao Asc diam:  3.90 cm  MITRAL VALVE MV Area (PHT): 2.54 cm    SHUNTS MV Decel Time: 299 msec    Systemic VTI:  0.16 m MV E velocity: 57.80 cm/s  Systemic Diam: 2.20 cm MV A velocity: 75.80 cm/s MV E/A ratio:  0.76  Toribio Fuel MD Electronically signed by Toribio Fuel MD Signature Date/Time: 07/04/2023/4:21:28 PM    Final          ______________________________________________________________________________________________        Risk Assessment/Calculations:    {Does this patient have ATRIAL FIBRILLATION?:[id number]}  {This patient may be at risk for Amyloid. He has one or more dx on the prob list or PMH from the following list -  Abnormal EKG, HFpEF/Diastolic CHF, Aortic Stenosis, LVH, Bilateral Carpal Tunnel Syndrome, Biceps Tendon Rupture, Spinal Stenosis, Pericardial Effusion, Left Atrial Enlargement, Conduction System Disorder.  See list below or review PMH.  Diagnoses From Problem List           Noted     PAF (paroxysmal atrial fibrillation) (HCC) 04/28/2024    Click HERE to open Cardiac Amyloid Screening SmartSet to order screening OR Click HERE to defer testing for 1 year or permanently :1}    Physical Exam:    VS:  There were no vitals taken for this visit.   Wt Readings from Last 3 Encounters:  11/04/24 199 lb (90.3 kg)  10/09/24 198 lb (89.8 kg)  10/08/24 198 lb 6.4 oz (90 kg)    Gen: *** distress, *** obese/well nourished/malnourished   Neck: No JVD, *** carotid bruit Ears: *** Frank Sign Cardiac: No Rubs or Gallops, *** Murmur, ***cardia, *** radial pulses Respiratory: Clear to auscultation bilaterally, *** effort, ***  respiratory rate GI: Soft, nontender, non-distended *** MS: No *** edema; *** moves all extremities Integument: Skin feels *** Neuro:  At time of evaluation, alert and oriented to person/place/time/situation *** Psych: Normal affect, patient feels ***  No BP recorded.  {Refresh Note OR Click here to enter BP  :1}***    ASSESSMENT AND PLAN: .    *** An EKG was ordered for *** and shows ***  Stanly Leavens, MD FASE [facility]  [address], [address] [place], [place] [address] [phone]  8:08 AM

## 2024-11-07 ENCOUNTER — Ambulatory Visit: Admitting: Vascular Surgery

## 2024-11-07 ENCOUNTER — Encounter: Payer: Self-pay | Admitting: Vascular Surgery

## 2024-11-07 ENCOUNTER — Ambulatory Visit (HOSPITAL_COMMUNITY)
Admission: RE | Admit: 2024-11-07 | Discharge: 2024-11-07 | Disposition: A | Source: Ambulatory Visit | Attending: Vascular Surgery

## 2024-11-07 ENCOUNTER — Ambulatory Visit (HOSPITAL_COMMUNITY)
Admission: RE | Admit: 2024-11-07 | Discharge: 2024-11-07 | Disposition: A | Discharge status: Home / Self Care | Source: Ambulatory Visit | Attending: Vascular Surgery | Admitting: Vascular Surgery

## 2024-11-07 VITALS — BP 113/69 | HR 67 | Temp 97.3°F | Resp 20 | Ht 72.0 in | Wt 199.0 lb

## 2024-11-07 DIAGNOSIS — R600 Localized edema: Secondary | ICD-10-CM | POA: Insufficient documentation

## 2024-11-07 DIAGNOSIS — I872 Venous insufficiency (chronic) (peripheral): Secondary | ICD-10-CM | POA: Insufficient documentation

## 2024-11-07 DIAGNOSIS — I739 Peripheral vascular disease, unspecified: Secondary | ICD-10-CM

## 2024-11-07 DIAGNOSIS — M7989 Other specified soft tissue disorders: Secondary | ICD-10-CM | POA: Insufficient documentation

## 2024-11-07 LAB — VAS US ABI WITH/WO TBI
Left ABI: 0.47
Right ABI: 0.52

## 2024-11-07 NOTE — Progress Notes (Signed)
 HISTORY AND PHYSICAL     CC:  follow up. Requesting Provider:  Gregary Sharper, MD  HPI: This is a 63 y.o. male who is here today for follow up for PAD.   Patient has a previous history of stroke which required percutaneous thrombectomy with Dr. Dolphus.   When evaluated in 2023.  He had critical limb ischemia and wounds, but was not a limb salvage candidate due to dense stroke neurodeficits, feeding tube, tracheostomy, nonambulatory.  Fortunately his wounds healed.  He presented today with his cousin.  He continues to struggle with speech, which continues to be incoherent, and has residual left-sided deficits.  Tracheostomy has been removed.  He uses a wheelchair at baseline, but is able to stand.  He does not do physical therapy exercises, and prefers to lay in the bed for most of the day.  When out of the house, he uses a wheelchair.  Denies rest pain, denies tissue loss.  Notes right hip pain when sitting.  Bilateral lower extremity edema from sitting in the dependent position-wheelchair daily.  The pt is on a statin for cholesterol management.    The pt is not on an aspirin .    Other AC:  Eliquis  The pt is not on medication for hypertension.  The pt is not on medication for diabetes. Tobacco hx:  former    Past Medical History:  Diagnosis Date   Asthma    Atypical chest pain 08/13/2022   Community acquired pneumonia 09/14/2022   COPD (chronic obstructive pulmonary disease) (HCC)    Essential hypertension 08/19/2021   GERD (gastroesophageal reflux disease)    HAP (hospital-acquired pneumonia) 09/16/2022   History of tracheostomy    03/09/22-04/11/22   HLD (hyperlipidemia)    Hypertension    Hypokalemia 08/13/2022   Hypomagnesemia 08/13/2022   Lung cancer (HCC)    PAD (peripheral artery disease)    Seizure disorder (HCC)    Seizures (HCC) 06/02/2022   Sepsis (HCC) 08/13/2022   Sepsis due to pneumonia (HCC) 04/13/2023   Stroke (HCC) 02/2022    Past Surgical History:   Procedure Laterality Date   BRONCHIAL BIOPSY  05/30/2022   Procedure: BRONCHIAL BIOPSIES;  Surgeon: Shelah Lamar RAMAN, MD;  Location: [facility] ENDOSCOPY;  Service: Pulmonary;;   BRONCHIAL BRUSHINGS  05/30/2022   Procedure: BRONCHIAL BRUSHINGS;  Surgeon: Shelah Lamar RAMAN, MD;  Location: [facility] ENDOSCOPY;  Service: Pulmonary;;   BRONCHIAL NEEDLE ASPIRATION BIOPSY  05/30/2022   Procedure: BRONCHIAL NEEDLE ASPIRATION BIOPSIES;  Surgeon: Shelah Lamar RAMAN, MD;  Location: [facility] ENDOSCOPY;  Service: Pulmonary;;   BRONCHIAL WASHINGS  05/30/2022   Procedure: BRONCHIAL WASHINGS;  Surgeon: Shelah Lamar RAMAN, MD;  Location: [facility] ENDOSCOPY;  Service: Pulmonary;;   CHOLECYSTECTOMY N/A 07/05/2023   Procedure: LAPAROSCOPIC CHOLECYSTECTOMY WITH INTRAOPERATIVE CHOLANGIOGRAM;  Surgeon: Sheldon Standing, MD;  Location: [facility] ORS;  Service: General;  Laterality: N/A;   ERCP N/A 07/06/2023   Procedure: ENDOSCOPIC RETROGRADE CHOLANGIOPANCREATOGRAPHY (ERCP);  Surgeon: Rosalie Kitchens, MD;  Location: [facility] ENDOSCOPY;  Service: Gastroenterology;  Laterality: N/A;   ESOPHAGOGASTRODUODENOSCOPY (EGD) WITH PROPOFOL  N/A 03/11/2022   Procedure: ESOPHAGOGASTRODUODENOSCOPY (EGD) WITH PROPOFOL ;  Surgeon: Sebastian Moles, MD;  Location: [facility] ENDOSCOPY;  Service: General;  Laterality: N/A;   FIDUCIAL MARKER PLACEMENT  05/30/2022   Procedure: FIDUCIAL MARKER PLACEMENT;  Surgeon: Shelah Lamar RAMAN, MD;  Location: [facility] ENDOSCOPY;  Service: Pulmonary;;   IR ANGIO INTRA EXTRACRAN SEL COM CAROTID INNOMINATE UNI R MOD SED  03/02/2022   IR CT HEAD LTD  03/02/2022   IR PERCUTANEOUS  ART THROMBECTOMY/INFUSION INTRACRANIAL INC DIAG ANGIO  03/02/2022   PEG PLACEMENT N/A 03/11/2022   Procedure: PERCUTANEOUS ENDOSCOPIC GASTROSTOMY (PEG) PLACEMENT;  Surgeon: Sebastian Moles, MD;  Location: [facility] ENDOSCOPY;  Service: General;  Laterality: N/A;   RADIOLOGY WITH ANESTHESIA N/A 03/02/2022   Procedure: IR WITH ANESTHESIA;  Surgeon: Dolphus Carrion, MD;  Location: [facility] OR;  Service: Radiology;   Laterality: N/A;   REMOVAL OF STONES  07/06/2023   Procedure: REMOVAL OF STONES;  Surgeon: Rosalie Kitchens, MD;  Location: [facility] ENDOSCOPY;  Service: Gastroenterology;;   ANNETT  07/06/2023   Procedure: ANNETT;  Surgeon: Rosalie Kitchens, MD;  Location: [facility] ENDOSCOPY;  Service: Gastroenterology;;   VIDEO BRONCHOSCOPY WITH RADIAL ENDOBRONCHIAL ULTRASOUND  05/30/2022   Procedure: VIDEO BRONCHOSCOPY WITH RADIAL ENDOBRONCHIAL ULTRASOUND;  Surgeon: Shelah Lamar RAMAN, MD;  Location: [facility] ENDOSCOPY;  Service: Pulmonary;;    No Known Allergies  Current Outpatient Medications  Medication Sig Dispense Refill   albuterol  (PROVENTIL ) (2.5 MG/3ML) 0.083% nebulizer solution Take 3 mLs (2.5 mg total) by nebulization every 4 (four) hours as needed for wheezing or shortness of breath. 90 mL 12   atorvastatin  (LIPITOR) 40 MG tablet Take 1 tablet (40 mg total) by mouth daily. 90 tablet 3   diclofenac  Sodium (VOLTAREN ) 1 % GEL Apply 2 g topically 4 (four) times daily. 300 g 3   enoxaparin  (LOVENOX ) 100 MG/ML injection Inject 0.85 mLs (85 mg total) into the skin every 12 (twelve) hours. 51 mL 2   lidocaine  (LIDODERM ) 5 % Place 1 patch onto the skin daily. Remove & Discard patch within 12 hours or as directed by MD 30 patch 0   Multiple Vitamin (MULTIVITAMIN WITH MINERALS) TABS tablet Take 1 tablet by mouth daily.     ondansetron  (ZOFRAN -ODT) 4 MG disintegrating tablet Take 1 tablet (4 mg total) by mouth every 8 (eight) hours as needed for nausea or vomiting. 20 tablet 0   pantoprazole  (PROTONIX ) 40 MG tablet TAKE 1 TABLET(40 MG) BY MOUTH DAILY 90 tablet 2   sucralfate  (CARAFATE ) 1 g tablet TAKE 1 TABLET(1 GRAM) BY MOUTH FOUR TIMES DAILY( WITH MEALS AND AT BEDTIME) 360 tablet 1   Tiotropium Bromide-Olodaterol (STIOLTO RESPIMAT ) 2.5-2.5 MCG/ACT AERS NEW PRESCRIPTION REQUEST: STIOLTO 2.5 MCG- INHALE TWO PUFFS BY MOUTH DAILY 12 g 3   No current facility-administered medications for this visit.    Family History   Problem Relation Age of Onset   Throat cancer Mother    Liver cancer Father    Kidney failure Sister    Cancer - Lung Paternal Uncle     Social History   Socioeconomic History   Marital status: Widowed    Spouse name: Not on file   Number of children: Not on file   Years of education: Not on file   Highest education level: Not on file  Occupational History   Not on file  Tobacco Use   Smoking status: Former    Current packs/day: 0.00    Types: Cigarettes    Quit date: 05/02/2022    Years since quitting: 2.5    Passive exposure: Never   Smokeless tobacco: Never  Vaping Use   Vaping status: Never Used  Substance and Sexual Activity   Alcohol use: Not Currently   Drug use: No   Sexual activity: Not Currently  Other Topics Concern   Not on file  Social History Narrative   Not on file   Social Drivers of Health   Financial Resource Strain: Not on file  Food  Insecurity: No Food Insecurity (07/02/2024)   Hunger Vital Sign    Worried About Running Out of Food in the Last Year: Never true    Ran Out of Food in the Last Year: Never true  Transportation Needs: No Transportation Needs (07/02/2024)   PRAPARE - Administrator, Civil Service (Medical): No    Lack of Transportation (Non-Medical): No  Physical Activity: Not on file  Stress: Not on file  Social Connections: Socially Isolated (04/26/2024)   Social Connection and Isolation Panel    Frequency of Communication with Friends and Family: Once a week    Frequency of Social Gatherings with Friends and Family: Once a week    Attends Religious Services: Never    Database Administrator or Organizations: No    Attends Banker Meetings: Never    Marital Status: Widowed  Intimate Partner Violence: Not At Risk (07/02/2024)   Humiliation, Afraid, Rape, and Kick questionnaire    Fear of Current or Ex-Partner: No    Emotionally Abused: No    Physically Abused: No    Sexually Abused: No     REVIEW OF  SYSTEMS:   [X]  denotes positive finding, [ ]  denotes negative finding Cardiac  Comments:  Chest pain or chest pressure:    Shortness of breath upon exertion:    Short of breath when lying flat:    Irregular heart rhythm:        Vascular    Pain in calf, thigh, or hip brought on by ambulation:    Pain in feet at night that wakes you up from your sleep:     Blood clot in your veins:    Leg swelling:  x       Pulmonary    Oxygen at home:    Productive cough:     Wheezing:         Neurologic    Sudden weakness in arms or legs:     Sudden numbness in arms or legs:     difficulty speaking or slurred speech: x   Temporary loss of vision in one eye:     Problems with dizziness:         Gastrointestinal    Blood in stool:     Vomited blood:         Genitourinary    Burning when urinating:     Blood in urine:        Psychiatric    Major depression:         Hematologic    Bleeding problems:    Problems with blood clotting too easily:        Skin    Rashes or ulcers:        Constitutional    Fever or chills:      PHYSICAL EXAMINATION:  Today's Vitals   11/07/24 1137  BP: 113/69  Pulse: 67  Resp: 20  Temp: (!) 97.3 F (36.3 C)  TempSrc: Temporal  SpO2: 97%  Weight: 199 lb (90.3 kg)  Height: 6' (1.829 m)   Body mass index is 26.99 kg/m.   General:  WDWN in NAD; vital signs documented above Gait: Not observed HENT: WNL, normocephalic Pulmonary: normal non-labored breathing , without wheezing Cardiac: regular HR, without carotid bruits Abdomen: soft, NT; aortic pulse is not palpable Skin: without rashes Vascular Exam/Pulses: Palpable radial pulses bilaterally Pedal pulses are not palpable Bilateral feet are warm with right warmer than left +swelling right leg.  Extremities:  without ischemic changes, without Gangrene , without cellulitis; without open wounds Musculoskeletal: no muscle wasting or atrophy  Neurologic: A&O X 3 Psychiatric:  The pt has  Normal affect.   ABI Findings:  +---------+------------------+-----+----------+--------+  Right   Rt Pressure (mmHg)IndexWaveform  Comment   +---------+------------------+-----+----------+--------+  Brachial 150                                        +---------+------------------+-----+----------+--------+  PTA     78                0.52 monophasic          +---------+------------------+-----+----------+--------+  DP      68                0.45 monophasic          +---------+------------------+-----+----------+--------+  Great Toe51                0.34 Normal              +---------+------------------+-----+----------+--------+   +---------+------------------+-----+----------+-------+  Left    Lt Pressure (mmHg)IndexWaveform  Comment  +---------+------------------+-----+----------+-------+  Brachial 132                                       +---------+------------------+-----+----------+-------+  PTA     70                0.47 monophasic         +---------+------------------+-----+----------+-------+  DP      57                0.38 monophasic         +---------+------------------+-----+----------+-------+  Great Toe0                 0.00 Absent             +---------+------------------+-----+----------+-------+      ASSESSMENT/PLAN:: 63 y.o. male here for follow up for PAD. Patient's history includes stroke with dense deficits.  Tracheostomy has since been removed.  Continues to struggle with the left side, as well as speech.  Able to answer yes/no questions.   Patient has moderate to severe peripheral arterial disease, however is asymptomatic. We discussed that he needs to continue to work with physical therapy in an effort to remain ambulatory for as long as possible.  No indication for intervention at this time, as he denies rest pain, does not have tissue loss.  My plan is to see him in 1 years time.  I asked his  cousin to call if new wounds are appreciated on the lower extremities.  While he does have some deep and superficial venous reflux, he is not a candidate for intervention as the greater saphenous vein is small.  I do not want him in compression stockings due to his arterial disease.  We discussed the importance of elevation. p

## 2024-11-13 ENCOUNTER — Emergency Department (HOSPITAL_COMMUNITY)

## 2024-11-13 ENCOUNTER — Other Ambulatory Visit: Payer: Self-pay

## 2024-11-13 ENCOUNTER — Emergency Department (HOSPITAL_COMMUNITY)
Admission: EM | Admit: 2024-11-13 | Discharge: 2024-11-13 | Disposition: A | Discharge status: Home / Self Care | Attending: Emergency Medicine | Admitting: Emergency Medicine

## 2024-11-13 ENCOUNTER — Ambulatory Visit: Admitting: Student

## 2024-11-13 DIAGNOSIS — R21 Rash and other nonspecific skin eruption: Secondary | ICD-10-CM | POA: Insufficient documentation

## 2024-11-13 DIAGNOSIS — Z8673 Personal history of transient ischemic attack (TIA), and cerebral infarction without residual deficits: Secondary | ICD-10-CM | POA: Insufficient documentation

## 2024-11-13 DIAGNOSIS — M25532 Pain in left wrist: Secondary | ICD-10-CM | POA: Diagnosis not present

## 2024-11-13 DIAGNOSIS — J449 Chronic obstructive pulmonary disease, unspecified: Secondary | ICD-10-CM | POA: Diagnosis not present

## 2024-11-13 DIAGNOSIS — Z85118 Personal history of other malignant neoplasm of bronchus and lung: Secondary | ICD-10-CM | POA: Diagnosis not present

## 2024-11-13 MED ORDER — HYDROCORTISONE 1 % EX CREA
TOPICAL_CREAM | Freq: Three times a day (TID) | CUTANEOUS | Status: DC
  Administered 2024-11-13: 1 via TOPICAL
  Filled 2024-11-13: qty 28

## 2024-11-13 MED ORDER — ACETAMINOPHEN 500 MG PO TABS
1000.0000 mg | ORAL_TABLET | Freq: Once | ORAL | Status: AC
Start: 2024-11-13 — End: 2024-11-13
  Administered 2024-11-13: 1000 mg via ORAL
  Filled 2024-11-13: qty 2

## 2024-11-13 NOTE — ED Notes (Signed)
 Patients family is coming to pick patient up in 15 minutes.

## 2024-11-13 NOTE — ED Notes (Signed)
Pt ride is here.

## 2024-11-13 NOTE — ED Triage Notes (Signed)
 Pt arrives via wheelchair with complaints of a rash to the RIGHT arm, and Itching of the left arm. Pt was difficult to triage due to a speech issue.    Pt appears to be in no distress at the time of triage.

## 2024-11-13 NOTE — ED Provider Notes (Signed)
 [place] EMERGENCY DEPARTMENT AT [facility] Provider Note   CSN: [id number] Arrival date & time: 11/13/24  1343     Patient presents with: Rash   Todd Estrada is a 63 y.o. male patient with past medical history of lung cancer, COPD, CVA - struggles with speech and is incoherent w/ left sided deficits, malnutrition is presenting to emergency room with 2 complaints.  He reports rash to right forearm for approximately 3 days which is itchy and painful.  Also reports when left wrist pain but no specific injury or fall, he has not noted any rash or overlying erythema. NO fever.     Rash      Prior to Admission medications   Medication Sig Start Date End Date Taking? Authorizing Provider  albuterol  (PROVENTIL ) (2.5 MG/3ML) 0.083% nebulizer solution Take 3 mLs (2.5 mg total) by nebulization every 4 (four) hours as needed for wheezing or shortness of breath. 08/16/22   Lou Claretta HERO, MD  atorvastatin  (LIPITOR) 40 MG tablet Take 1 tablet (40 mg total) by mouth daily. 08/14/24 08/14/25  [name], [name], DO  diclofenac  Sodium (VOLTAREN ) 1 % GEL Apply 2 g topically 4 (four) times daily. 10/08/24   Zheng, Michael, DO  enoxaparin  (LOVENOX ) 100 MG/ML injection Inject 0.85 mLs (85 mg total) into the skin every 12 (twelve) hours. 08/14/24   [name], [name], DO  lidocaine  (LIDODERM ) 5 % Place 1 patch onto the skin daily. Remove & Discard patch within 12 hours or as directed by MD 08/14/24   [name], [name], DO  Multiple Vitamin (MULTIVITAMIN WITH MINERALS) TABS tablet Take 1 tablet by mouth daily.    [provider]  ondansetron  (ZOFRAN -ODT) 4 MG disintegrating tablet Take 1 tablet (4 mg total) by mouth every 8 (eight) hours as needed for nausea or vomiting. 10/17/24   Minnie Tinnie BRAVO, PA  pantoprazole  (PROTONIX ) 40 MG tablet TAKE 1 TABLET(40 MG) BY MOUTH DAILY 07/08/24   Tawkaliyar, Roya, DO  sucralfate  (CARAFATE ) 1 g tablet TAKE 1 TABLET(1 GRAM) BY MOUTH FOUR TIMES DAILY( WITH  MEALS AND AT BEDTIME) 09/19/24   Tobie Gaines, DO  Tiotropium Bromide-Olodaterol (STIOLTO RESPIMAT ) 2.5-2.5 MCG/ACT AERS NEW PRESCRIPTION REQUEST: STIOLTO 2.5 MCG- INHALE TWO PUFFS BY MOUTH DAILY 08/14/24   [name], [name], DO    Allergies: Patient has no known allergies.    Review of Systems  Skin:  Positive for rash.    Updated Vital Signs BP 128/78 (BP Location: Left Arm)   Pulse 72   Temp 98.4 F (36.9 C) (Oral)   Resp 16   SpO2 98%   Physical Exam Vitals and nursing note reviewed.  Constitutional:      General: He is not in acute distress.    Appearance: He is not toxic-appearing.  HENT:     Head: Normocephalic and atraumatic.  Eyes:     General: No scleral icterus.    Conjunctiva/sclera: Conjunctivae normal.  Cardiovascular:     Rate and Rhythm: Normal rate and regular rhythm.     Pulses: Normal pulses.     Heart sounds: Normal heart sounds.  Pulmonary:     Effort: Pulmonary effort is normal. No respiratory distress.     Breath sounds: Normal breath sounds.  Abdominal:     General: Abdomen is flat. Bowel sounds are normal.     Palpations: Abdomen is soft.     Tenderness: There is no abdominal tenderness.  Skin:    General: Skin is warm and dry.  Findings: No lesion.     Comments: Rash over right forearm slightly erythematous and describes itchy. Negative nikolsky sign, no cellulitis or area of abscess.   Neurological:     General: No focal deficit present.     Mental Status: He is alert and oriented to person, place, and time. Mental status is at baseline.     (all labs ordered are listed, but only abnormal results are displayed) Labs Reviewed - No data to display  EKG: None  Radiology: No results found.   Procedures   Medications Ordered in the ED  hydrocortisone  cream 1 % (has no administration in time range)  acetaminophen  (TYLENOL ) tablet 1,000 mg (has no administration in time range)                                    Medical Decision  Making Amount and/or Complexity of Data Reviewed Radiology: ordered.  Risk OTC drugs.   This patient presents to the ED for concern of left wrist pain/ right forearm rash, this involves an extensive number of treatment options, and is a complaint that carries with it a high risk of complications and morbidity.  The differential diagnosis includes fracture, sprain, cellulitis, abscess, deep space infection    Imaging Studies ordered:  I ordered imaging studies including Left wrist x-ray   Normal by my potation, patient reports he does not want to wait any longer and requesting discharge without open radiology reviewed imaging.   Problem List / ED Course / Critical interventions / Medication management  Presents emergency room with 2 complaints.  He notes he has had several days of rash to his right forearm which is itchy and red.  He denies any known contact allergen or any new exposure.  He is unsure what has caused this.  He does not have fever or sign of abscess or cellulitis.  Exam is most consistent with contact dermatitis.  Also complains of left wrist.  Considered Tylenol .  Has normal motion in flexion.  He is neurovascularly intact.  I did order imaging but patient declines to wait for x-ray result. I ordered medication including Tylenol , Hydrocortisone .  Reevaluation of the patient after these medicines showed that the patient improved I have reviewed the patients home medicines and have made adjustments as needed. Stable well-appearing, discharged with outpatient follow-up.       Final diagnoses:  Rash    ED Discharge Orders     None          Shermon Warren SAILOR, PA-C 11/13/24 1731    Armenta Canning, MD 11/17/24 1821

## 2024-11-13 NOTE — Discharge Instructions (Signed)
 Please take Tylenol  for wrist pain.  Your x-ray has not resulted yet so please follow-up with results on MyChart.  Apply hydrocortisone  ointment to your rash 2-3 times daily and return to emergency room with new or worsening symptoms.  Need to follow-up with primary care.

## 2024-11-18 ENCOUNTER — Other Ambulatory Visit: Payer: Self-pay

## 2024-11-18 ENCOUNTER — Ambulatory Visit

## 2024-11-18 VITALS — BP 119/74 | HR 94 | Temp 99.3°F | Ht 73.0 in | Wt 196.0 lb

## 2024-11-18 DIAGNOSIS — Z0000 Encounter for general adult medical examination without abnormal findings: Secondary | ICD-10-CM

## 2024-11-18 DIAGNOSIS — Z79899 Other long term (current) drug therapy: Secondary | ICD-10-CM | POA: Diagnosis not present

## 2024-11-18 DIAGNOSIS — Z808 Family history of malignant neoplasm of other organs or systems: Secondary | ICD-10-CM | POA: Diagnosis not present

## 2024-11-18 DIAGNOSIS — Z23 Encounter for immunization: Secondary | ICD-10-CM

## 2024-11-18 DIAGNOSIS — Z7901 Long term (current) use of anticoagulants: Secondary | ICD-10-CM | POA: Diagnosis not present

## 2024-11-18 DIAGNOSIS — C3490 Malignant neoplasm of unspecified part of unspecified bronchus or lung: Secondary | ICD-10-CM

## 2024-11-18 DIAGNOSIS — Z87891 Personal history of nicotine dependence: Secondary | ICD-10-CM

## 2024-11-18 DIAGNOSIS — L259 Unspecified contact dermatitis, unspecified cause: Secondary | ICD-10-CM | POA: Insufficient documentation

## 2024-11-18 DIAGNOSIS — Z86711 Personal history of pulmonary embolism: Secondary | ICD-10-CM

## 2024-11-18 DIAGNOSIS — I69351 Hemiplegia and hemiparesis following cerebral infarction affecting right dominant side: Secondary | ICD-10-CM

## 2024-11-18 DIAGNOSIS — C3411 Malignant neoplasm of upper lobe, right bronchus or lung: Secondary | ICD-10-CM

## 2024-11-18 DIAGNOSIS — M25532 Pain in left wrist: Secondary | ICD-10-CM | POA: Insufficient documentation

## 2024-11-18 DIAGNOSIS — I69328 Other speech and language deficits following cerebral infarction: Secondary | ICD-10-CM | POA: Diagnosis not present

## 2024-11-18 MED ORDER — DICLOFENAC SODIUM 1 % EX GEL: 2.0000 g | Freq: Four times a day (QID) | CUTANEOUS | 3 refills | Status: DC

## 2024-11-18 MED ORDER — ENOXAPARIN SODIUM 100 MG/ML IJ SOSY: 1.0000 mg/kg | PREFILLED_SYRINGE | Freq: Two times a day (BID) | INTRAMUSCULAR | 2 refills | Status: AC

## 2024-11-18 MED ORDER — LIDOCAINE 5 % EX PTCH: 1.0000 | MEDICATED_PATCH | CUTANEOUS | 5 refills | Status: DC

## 2024-11-18 NOTE — Progress Notes (Signed)
 Patient name: Todd Estrada Date of birth: 05-Jun-1961 Date of visit: 11/18/24  Type of visit: Established Patient Office Visit   Subjective   Chief concern:  Chief Complaint  Patient presents with   Medication Refill    Pt needs med refill.    Wrist Pain    Pt is having wrist pain, pain has been going on for about 2 weeks.     Todd Estrada is a 63 y.o. 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  He is accompanied by his cousin Todd Estrada today who assists with the history portion for the visit.  Patient presented to [facility] ED on 11/13/2024 with concerns of right forearm rash which was felt to be consistent with contact dermatitis on physical exam as well as left wrist pain which was negative for fracture or dislocation on x-ray.  They discussed Tylenol  and hydrocortisone  cream as treatment plan.  Since then, right arm itching has improved and is mildly itchy.  He reports that his left wrist continues to be painful along the medial aspect.  He has had no recent falls or recent injuries.  He was last seen in the [facility] on 10/08/2024 with Dr. Elicia. They discussed a referral to neurology as patient was previously on Keppra  for seizures in the setting of his stroke, as he had been off Keppra  since 04/2024 with no new seizure episodes.  By chart review it appears that referral authorization was approved and [facility] did try to reach out to patient's cousin without success, and so he has not yet been scheduled with neurology.  For his PAD he follows with [facility] Dr. Lanis which he was last seen on 11/07/2024.  They discussed that he needs to continue to work with physical therapy in an effort to remain ambulatory.  He should not be in compression stockings due to his arterial disease and they discussed the  importance of elevation.  He will follow-up with him yearly.  For his NSCLC he follows with oncology Dr. Sherrod who last saw him on 11/04/2024.  He has a malignant neoplasm of the right upper lobe lung which they are currently observing with repeat CT Chest scans every 6 months.  He was having chest pain from radiation therapy and they discussed Tylenol  and lidocaine  patches for pain.  Would like to receive his flu shot and Prevnar today.   I discussed colon cancer screening with them, and patient's cousin reports that they have the Cologuard kit at home but have not yet sent the sample.  ROS: Negative unless otherwise listed in the HPI.  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 07/07/2023   Buttock wound, right, subsequent encounter 03/01/2023   PAD (peripheral artery disease) 01/06/2023   Hypotension 01/06/2023   History of pulmonary embolism 10/12/2022   Cervical transverse process fracture (HCC) 09/10/2022   Anemia of chronic disease 08/13/2022   Chronic heart failure with mildly reduced ejection fraction (HFmrEF) (HCC) 08/13/2022   Necrotizing pneumonia (HCC) 08/12/2022   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) 06/24/2022   Encounter for antineoplastic chemotherapy 06/24/2022   Healthcare maintenance 06/22/2022   Seizure (HCC) 06/02/2022   Squamous cell lung cancer (HCC) 05/10/2022   Emphysema lung (HCC) 04/14/2022   Dyslipidemia    Dysphagia, post-stroke    Internal carotid artery occlusion, left 03/03/2022   Malnutrition of moderate degree 03/03/2022   Tobacco use disorder 08/19/2021     Past Surgical History:  Procedure Laterality Date    BRONCHIAL BIOPSY  05/30/2022   Procedure: BRONCHIAL BIOPSIES;  Surgeon: Shelah Lamar RAMAN, MD;  Location: [facility] ENDOSCOPY;  Service: Pulmonary;;   BRONCHIAL BRUSHINGS  05/30/2022   Procedure: BRONCHIAL BRUSHINGS;  Surgeon: Shelah Lamar RAMAN, MD;  Location: [facility] ENDOSCOPY;  Service: Pulmonary;;   BRONCHIAL NEEDLE ASPIRATION BIOPSY  05/30/2022   Procedure: BRONCHIAL NEEDLE ASPIRATION BIOPSIES;  Surgeon: Shelah Lamar RAMAN, MD;  Location: [facility] ENDOSCOPY;  Service: Pulmonary;;   BRONCHIAL WASHINGS  05/30/2022   Procedure: BRONCHIAL WASHINGS;  Surgeon: Shelah Lamar RAMAN, MD;  Location: [facility] ENDOSCOPY;  Service: Pulmonary;;   CHOLECYSTECTOMY N/A 07/05/2023   Procedure: LAPAROSCOPIC CHOLECYSTECTOMY WITH INTRAOPERATIVE CHOLANGIOGRAM;  Surgeon: Sheldon Standing, MD;  Location: [facility] ORS;  Service: General;  Laterality: N/A;   ERCP N/A 07/06/2023   Procedure: ENDOSCOPIC RETROGRADE CHOLANGIOPANCREATOGRAPHY (ERCP);  Surgeon: Rosalie Kitchens, MD;  Location: [facility] ENDOSCOPY;  Service: Gastroenterology;  Laterality: N/A;   ESOPHAGOGASTRODUODENOSCOPY (EGD) WITH PROPOFOL  N/A 03/11/2022   Procedure: ESOPHAGOGASTRODUODENOSCOPY (EGD) WITH PROPOFOL ;  Surgeon: Sebastian Moles, MD;  Location: [facility] ENDOSCOPY;  Service: General;  Laterality: N/A;   FIDUCIAL MARKER PLACEMENT  05/30/2022   Procedure: FIDUCIAL MARKER PLACEMENT;  Surgeon: Shelah Lamar RAMAN, MD;  Location: [facility] ENDOSCOPY;  Service: Pulmonary;;   IR ANGIO INTRA EXTRACRAN SEL COM CAROTID INNOMINATE UNI R MOD SED  03/02/2022   IR CT HEAD LTD  03/02/2022   IR PERCUTANEOUS ART THROMBECTOMY/INFUSION INTRACRANIAL INC DIAG ANGIO  03/02/2022   PEG PLACEMENT N/A 03/11/2022   Procedure: PERCUTANEOUS ENDOSCOPIC GASTROSTOMY (PEG) PLACEMENT;  Surgeon: Sebastian Moles, MD;  Location: [facility] ENDOSCOPY;  Service: General;  Laterality: N/A;   RADIOLOGY WITH ANESTHESIA N/A 03/02/2022   Procedure: IR WITH ANESTHESIA;  Surgeon: Dolphus Carrion, MD;  Location: [facility] OR;  Service: Radiology;  Laterality: N/A;   REMOVAL OF STONES   07/06/2023   Procedure: REMOVAL OF STONES;  Surgeon: Rosalie Kitchens, MD;  Location: [facility] ENDOSCOPY;  Service: Gastroenterology;;   ANNETT  07/06/2023   Procedure: ANNETT;  Surgeon: Rosalie Kitchens, MD;  Location: [facility] ENDOSCOPY;  Service: Gastroenterology;;   VIDEO BRONCHOSCOPY WITH RADIAL ENDOBRONCHIAL ULTRASOUND  05/30/2022   Procedure: VIDEO BRONCHOSCOPY WITH RADIAL ENDOBRONCHIAL ULTRASOUND;  Surgeon: Shelah Lamar RAMAN, MD;  Location: [facility] ENDOSCOPY;  Service: Pulmonary;;     Current Outpatient Medications  Medication Instructions   albuterol  (PROVENTIL ) 2.5 mg, Nebulization, Every 4 hours PRN   atorvastatin  (LIPITOR) 40 mg, Oral, Daily   diclofenac  Sodium (VOLTAREN ) 2 g, Topical, 4 times daily   enoxaparin  (LOVENOX ) 1 mg/kg, Subcutaneous, Every 12 hours   lidocaine  (LIDODERM ) 5 % 1 patch, Transdermal, Every 24 hours, Remove & Discard patch within 12 hours or as directed by MD   Multiple Vitamin (MULTIVITAMIN WITH MINERALS) TABS tablet 1 tablet, Daily   ondansetron  (ZOFRAN -ODT) 4 mg, Oral, Every 8 hours PRN   pantoprazole  (PROTONIX ) 40 MG tablet TAKE 1 TABLET(40 MG) BY MOUTH DAILY   sucralfate  (CARAFATE ) 1 g tablet TAKE 1 TABLET(1 GRAM) BY MOUTH FOUR TIMES DAILY( WITH MEALS AND AT BEDTIME)   Tiotropium  Bromide-Olodaterol (STIOLTO RESPIMAT ) 2.5-2.5 MCG/ACT AERS NEW PRESCRIPTION REQUEST: STIOLTO 2.5 MCG- INHALE TWO PUFFS BY MOUTH DAILY    Social History   Tobacco Use   Smoking status: Former    Current packs/day: 0.00    Types: Cigarettes    Quit date: 05/02/2022    Years since quitting: 2.5    Passive exposure: Never   Smokeless tobacco: Never  Vaping Use   Vaping status: Never Used  Substance Use Topics   Alcohol use: Not Currently   Drug use: No      Objective  Today's Vitals   11/18/24 1309  BP: 119/74  Pulse: 94  Temp: 99.3 F (37.4 C)  TempSrc: Oral  SpO2: 96%  Weight: 196 lb (88.9 kg)  Height: 6' 1 (1.854 m)  PainSc: 10-Worst pain ever  PainLoc: Wrist  Body  mass index is 25.86 kg/m.   Physical Exam:   Constitutional: well-appearing male sitting in exam chair, in no acute distress. Ambulates with use of wheelchair assistance device HEENT: normocephalic atraumatic, mucous membranes moist Cardiovascular: regular rate and rhythm Pulmonary/Chest: normal work of breathing on room air, lungs clear to auscultation bilaterally Abdominal: soft, non-tender, non-distended MSK: normal bulk and tone. Left wrist with full AROM and PROM, mild pain along medial aspect of wrist to palpation, pain of medial aspect of wrist with ulnar deviation, no swelling, warmth or erythema, denies pain along digits or metacarpals, denies lateral wrist pain.  Full grip strength present. Neurological: alert & oriented x3 Skin: warm and dry, right forearm with excoriations and dried scabbing without evidence of rash present Psych: mood calm, behavior normal, thought content normal, judgement normal      The ASCVD Risk score (Arnett DK, et al., 2019) failed to calculate for the following reasons:   Risk score cannot be calculated because patient has a medical history suggesting prior/existing ASCVD      Assessment & Plan  Problem List Items Addressed This Visit       Respiratory   Squamous cell lung cancer (HCC) (Chronic)   History of stage IIb (T2 a, N1, M0) non-small cell lung cancer which he is followed by oncology Dr. Sherrod and was last seen by him on 11/04/2024.  They are currently observing his malignant neoplasm of the right upper lobe lung with repeat CT chest scans every 6 months.        Musculoskeletal and Integument   Contact dermatitis   Seen in the ED on 11/13/2024 with concerns of right forearm rash, and was felt to be consistent with contact dermatitis on physical exam.  Patient was given OTC strength hydrocortisone  cream, and rash has been improving.  On physical exam today there are excoriations present and dried blood, though no evidence of  maculopapular rash present at this point.  Discussed leaving the area open to the air and cleaning it well, applying a lotion/cream such as Eucerin, and may continue to use hydrocortisone  cream for the itching.  If itching worsens or rash returns, then would send in hydrocortisone  2.5% strength.        Other   History of pulmonary embolism (Chronic)   Patient hospitalized in July 2025 for nonocclusive segmental PE in the left lower lobe and age-indeterminate DVT of left femoral vein. Previously was taking Eliquis  for paroxysmal A-fib. Now on Lovenox  100 mg BID and requesting refill.  - Continue Lovenox  0.85 mL (85 mg) twice daily       Relevant Medications   enoxaparin  (LOVENOX ) 100 MG/ML injection  Healthcare maintenance   Given Prevnar vaccine today.  He would like to have his influenza vaccine, though we are out of these in the office right now and discussed that he can obtain this through his pharmacy.  We discussed colon cancer screening and patient's cousin reports that they do have the Cologuard kit at home, but have not sent the sample off yet.  I encouraged him to go ahead and complete this.  - Discussed completing Cologuard for colon cancer screening; please confirm at next visit they were able to send this off - Prevnar vaccine given today - Pharmacy for influenza vaccine      Wrist pain, left - Primary   Patient presents for follow-up from an ED visit on 01/14/2024 where he was seen for a right forearm rash which is improving, and left wrist pain.  X-ray performed in the ED was negative for fracture or dislocation.  His cousin reports that this has been insidious onset and he has not had any recent falls or injuries.  Patient with full AROM and PROM, with mild pain along the medial aspect of his wrist to palpation as well as pain along medial aspect of his wrist with ulnar deviation.  Grip strength full.  There is no swelling, warmth, or erythema present.  He denies pain along his  digits or metacarpals, and denies lateral wrist pain.  Suspect this is an overuse injury as patient relies on his left upper extremity as his right upper extremity has severe residual deficits from his CVA.  I discussed conservative treatment including Tylenol  Extra Strength 1000 mg twice daily, icing the wrist twice daily, Voltaren  gel, and lidocaine  patches.  I also discussed obtaining a wrist splint and showed them a picture of a wrist cock up splint that they could obtain from the pharmacy.  He is also going to begin working with physical therapy for general strength improvement and I have reached out to [employer] regarding authorization for this, referral was placed by Dr. Zheng's last visit in the [facility].  We discussed wrist stretches and exercises, which I have provided handout for and would like him to begin in about 5 to 7 days.  If pain is worsening or not improved in the next 3 to 4 weeks, discussed that they could reach out to us  and we could place a referral to our Sports Medicine colleagues. - Tylenol  Extra Strength 1000 mg BID, Voltaren  gel, lidocaine  patches, ice 2x daily, and wrist cock up splint - Wrist exercises/stretches PDF provided in AVS to begin in 5-7 days, advising to not complete any that cause pain - Referral to Sports Medicine if worsening or not improving in 3-4 weeks, cousin to reach out to us  if referral needed         Relevant Medications   diclofenac  Sodium (VOLTAREN ) 1 % GEL   lidocaine  (LIDODERM ) 5 %   Other Visit Diagnoses       Encounter for immunization       Relevant Orders   Pneumococcal conjugate vaccine 20-valent (Completed)       Return in about 2 months (around 01/19/2025) for routine appointment.   Patient discussed with Dr. Lovie, who also saw and evaluated the patient.  Doyal Miyamoto, MD [facility]  PGY-1 11/18/2024, 3:23 PM

## 2024-11-18 NOTE — Assessment & Plan Note (Addendum)
 Given Prevnar vaccine today.  He would like to have his influenza vaccine, though we are out of these in the office right now and discussed that he can obtain this through his pharmacy.  We discussed colon cancer screening and patient's cousin reports that they do have the Cologuard kit at home, but have not sent the sample off yet.  I encouraged him to go ahead and complete this.  - Discussed completing Cologuard for colon cancer screening; please confirm at next visit they were able to send this off - Prevnar vaccine given today - Pharmacy for influenza vaccine

## 2024-11-18 NOTE — Assessment & Plan Note (Signed)
 Patient presents for follow-up from an ED visit on 01/14/2024 where he was seen for a right forearm rash which is improving, and left wrist pain.  X-ray performed in the ED was negative for fracture or dislocation.  His cousin reports that this has been insidious onset and he has not had any recent falls or injuries.  Patient with full AROM and PROM, with mild pain along the medial aspect of his wrist to palpation as well as pain along medial aspect of his wrist with ulnar deviation.  Grip strength full.  There is no swelling, warmth, or erythema present.  He denies pain along his digits or metacarpals, and denies lateral wrist pain.  Suspect this is an overuse injury as patient relies on his left upper extremity as his right upper extremity has severe residual deficits from his CVA.  I discussed conservative treatment including Tylenol  Extra Strength 1000 mg twice daily, icing the wrist twice daily, Voltaren  gel, and lidocaine  patches.  I also discussed obtaining a wrist splint and showed them a picture of a wrist cock up splint that they could obtain from the pharmacy.  He is also going to begin working with physical therapy for general strength improvement and I have reached out to [employer] regarding authorization for this, referral was placed by Dr. Zheng's last visit in the [facility].  We discussed wrist stretches and exercises, which I have provided handout for and would like him to begin in about 5 to 7 days.  If pain is worsening or not improved in the next 3 to 4 weeks, discussed that they could reach out to us  and we could place a referral to our Sports Medicine colleagues. - Tylenol  Extra Strength 1000 mg BID, Voltaren  gel, lidocaine  patches, ice 2x daily, and wrist cock up splint - Wrist exercises/stretches PDF provided in AVS to begin in 5-7 days, advising to not complete any that cause pain - Referral to Sports Medicine if worsening or not improving in 3-4 weeks, cousin to reach out to us  if referral needed

## 2024-11-18 NOTE — Assessment & Plan Note (Addendum)
 Patient hospitalized in July 2025 for nonocclusive segmental PE in the left lower lobe and age-indeterminate DVT of left femoral vein. Previously was taking Eliquis  for paroxysmal A-fib. Now on Lovenox  100 mg BID and requesting refill.  - Continue Lovenox  0.85 mL (85 mg) twice daily

## 2024-11-18 NOTE — Assessment & Plan Note (Signed)
 History of stage IIb (T2 a, N1, M0) non-small cell lung cancer which he is followed by oncology Dr. Sherrod and was last seen by him on 11/04/2024.  They are currently observing his malignant neoplasm of the right upper lobe lung with repeat CT chest scans every 6 months.

## 2024-11-18 NOTE — Assessment & Plan Note (Signed)
 Seen in the ED on 11/13/2024 with concerns of right forearm rash, and was felt to be consistent with contact dermatitis on physical exam.  Patient was given OTC strength hydrocortisone  cream, and rash has been improving.  On physical exam today there are excoriations present and dried blood, though no evidence of maculopapular rash present at this point.  Discussed leaving the area open to the air and cleaning it well, applying a lotion/cream such as Eucerin, and may continue to use hydrocortisone  cream for the itching.  If itching worsens or rash returns, then would send in hydrocortisone  2.5% strength.

## 2024-11-18 NOTE — Patient Instructions (Addendum)
 Thank you, Mr.Jsean L Ginther for allowing us  to provide your care today. Today we discussed the following:  - Please call [facility] to set up your Neurology appointment. [phone] - I have reached out to [place] to give you a call regarding the Physical Therapy  - Let's try to calm down the pain with Tylenol  Extra Strength twice daily, icing the wrist twice daily, applying Voltaren  gel, lidocaine  patches, and then in 5-7 days begin the stretches and exercises. I'd like for you to work with PT as well. If it is no better in ~3-4 weeks of conservative treatment, let us  know and we can refer you to our Sports Medicine colleagues     I have ordered the following medication/changed the following medications:    Start the following medications: Meds ordered this encounter  Medications   diclofenac  Sodium (VOLTAREN ) 1 % GEL    Sig: Apply 2 g topically 4 (four) times daily.    Dispense:  100 g    Refill:  3   lidocaine  (LIDODERM ) 5 %    Sig: Place 1 patch onto the skin daily. Remove & Discard patch within 12 hours or as directed by MD    Dispense:  30 patch    Refill:  5   enoxaparin  (LOVENOX ) 100 MG/ML injection    Sig: Inject 0.85 mLs (85 mg total) into the skin every 12 (twelve) hours.    Dispense:  51 mL    Refill:  2     Follow up: 2 months    Should you have any questions or concerns please call the Internal Medicine Clinic at [phone].     Doyal Miyamoto, MD [facility]

## 2024-11-19 ENCOUNTER — Telehealth: Payer: Self-pay

## 2024-11-19 NOTE — Telephone Encounter (Signed)
 Prior Authorization for patient (Lidoderm  5% patches) came through on cover my meds was submitted with last office notes awaiting approval or denial.  KEY:[id number]

## 2024-11-19 NOTE — Progress Notes (Signed)
Internal Medicine Clinic Attending  I was physically present during the key portions of the resident provided service and participated in the medical decision making of patient's management care. I reviewed pertinent patient test results.  The assessment, diagnosis, and plan were formulated together and I agree with the documentation in the resident's note.  Mercie Eon, MD

## 2024-11-20 ENCOUNTER — Observation Stay (HOSPITAL_COMMUNITY)
Admission: EM | Admit: 2024-11-20 | Discharge: 2024-11-24 | Disposition: A | Attending: Emergency Medicine | Admitting: Emergency Medicine

## 2024-11-20 ENCOUNTER — Encounter (HOSPITAL_COMMUNITY): Payer: Self-pay

## 2024-11-20 ENCOUNTER — Emergency Department (HOSPITAL_COMMUNITY)

## 2024-11-20 ENCOUNTER — Other Ambulatory Visit: Payer: Self-pay

## 2024-11-20 DIAGNOSIS — I739 Peripheral vascular disease, unspecified: Secondary | ICD-10-CM | POA: Diagnosis present

## 2024-11-20 DIAGNOSIS — K5900 Constipation, unspecified: Secondary | ICD-10-CM | POA: Diagnosis not present

## 2024-11-20 DIAGNOSIS — R109 Unspecified abdominal pain: Secondary | ICD-10-CM | POA: Insufficient documentation

## 2024-11-20 DIAGNOSIS — Z79899 Other long term (current) drug therapy: Secondary | ICD-10-CM | POA: Diagnosis not present

## 2024-11-20 DIAGNOSIS — N179 Acute kidney failure, unspecified: Secondary | ICD-10-CM | POA: Diagnosis not present

## 2024-11-20 DIAGNOSIS — I480 Paroxysmal atrial fibrillation: Secondary | ICD-10-CM | POA: Diagnosis not present

## 2024-11-20 DIAGNOSIS — C3411 Malignant neoplasm of upper lobe, right bronchus or lung: Secondary | ICD-10-CM | POA: Diagnosis not present

## 2024-11-20 DIAGNOSIS — K219 Gastro-esophageal reflux disease without esophagitis: Secondary | ICD-10-CM | POA: Diagnosis not present

## 2024-11-20 DIAGNOSIS — E785 Hyperlipidemia, unspecified: Secondary | ICD-10-CM | POA: Diagnosis not present

## 2024-11-20 DIAGNOSIS — R1012 Left upper quadrant pain: Secondary | ICD-10-CM | POA: Diagnosis not present

## 2024-11-20 DIAGNOSIS — S3500XA Unspecified injury of abdominal aorta, initial encounter: Secondary | ICD-10-CM | POA: Diagnosis present

## 2024-11-20 DIAGNOSIS — S35239A Unspecified injury of inferior mesenteric artery, initial encounter: Secondary | ICD-10-CM | POA: Diagnosis not present

## 2024-11-20 DIAGNOSIS — Z8673 Personal history of transient ischemic attack (TIA), and cerebral infarction without residual deficits: Secondary | ICD-10-CM | POA: Diagnosis not present

## 2024-11-20 DIAGNOSIS — Z86711 Personal history of pulmonary embolism: Secondary | ICD-10-CM | POA: Diagnosis present

## 2024-11-20 DIAGNOSIS — Z9221 Personal history of antineoplastic chemotherapy: Secondary | ICD-10-CM | POA: Diagnosis not present

## 2024-11-20 DIAGNOSIS — W19XXXA Unspecified fall, initial encounter: Principal | ICD-10-CM

## 2024-11-20 DIAGNOSIS — Z87891 Personal history of nicotine dependence: Secondary | ICD-10-CM | POA: Diagnosis not present

## 2024-11-20 DIAGNOSIS — G40909 Epilepsy, unspecified, not intractable, without status epilepticus: Secondary | ICD-10-CM | POA: Diagnosis not present

## 2024-11-20 LAB — COMPREHENSIVE METABOLIC PANEL WITH GFR
ALT: 20 U/L (ref 0–44)
AST: 19 U/L (ref 15–41)
Albumin: 4.2 g/dL (ref 3.5–5.0)
Alkaline Phosphatase: 99 U/L (ref 38–126)
Anion gap: 13 (ref 5–15)
BUN: 18 mg/dL (ref 8–23)
CO2: 25 mmol/L (ref 22–32)
Calcium: 9.8 mg/dL (ref 8.9–10.3)
Chloride: 101 mmol/L (ref 98–111)
Creatinine, Ser: 0.89 mg/dL (ref 0.61–1.24)
GFR, Estimated: >60 mL/min (ref >60)
Glucose, Bld: 104 mg/dL — ABNORMAL HIGH (ref 70–99)
Potassium: 4.2 mmol/L (ref 3.5–5.1)
Sodium: 138 mmol/L (ref 135–145)
Total Bilirubin: 0.7 mg/dL (ref 0.0–1.2)
Total Protein: 7.6 g/dL (ref 6.5–8.1)

## 2024-11-20 LAB — CBC WITH DIFFERENTIAL/PLATELET
Abs Immature Granulocytes: 0.01 K/uL (ref 0.00–0.07)
Basophils Absolute: 0 K/uL (ref 0.0–0.1)
Basophils Relative: 0 %
Eosinophils Absolute: 0.1 K/uL (ref 0.0–0.5)
Eosinophils Relative: 1 %
HCT: 41 % (ref 39.0–52.0)
Hemoglobin: 13.3 g/dL (ref 13.0–17.0)
Immature Granulocytes: 0 %
Lymphocytes Relative: 13 %
Lymphs Abs: 0.9 K/uL (ref 0.7–4.0)
MCH: 30 pg (ref 26.0–34.0)
MCHC: 32.4 g/dL (ref 30.0–36.0)
MCV: 92.3 fL (ref 80.0–100.0)
Monocytes Absolute: 1 K/uL (ref 0.1–1.0)
Monocytes Relative: 13 %
Neutro Abs: 5.3 K/uL (ref 1.7–7.7)
Neutrophils Relative %: 73 %
Platelets: 148 K/uL — ABNORMAL LOW (ref 150–400)
RBC: 4.44 MIL/uL (ref 4.22–5.81)
RDW: 13.3 % (ref 11.5–15.5)
WBC: 7.3 K/uL (ref 4.0–10.5)
nRBC: 0 % (ref 0.0–0.2)

## 2024-11-20 LAB — LIPASE, BLOOD: Lipase: 17 U/L (ref 11–51)

## 2024-11-20 MED ORDER — MORPHINE SULFATE (PF) 4 MG/ML IV SOLN
4.0000 mg | Freq: Once | INTRAVENOUS | Status: AC
  Administered 2024-11-20: 4 mg via INTRAVENOUS
  Filled 2024-11-20: qty 1

## 2024-11-20 MED ORDER — IOHEXOL 300 MG/ML  SOLN
100.0000 mL | Freq: Once | INTRAMUSCULAR | Status: AC | PRN
  Administered 2024-11-20: 100 mL via INTRAVENOUS

## 2024-11-20 NOTE — ED Triage Notes (Signed)
 Patient fell walking to the bathroom into a wooden desk and it hit his left stomach area. Called family member lynwood for more information. Did not hit his head.

## 2024-11-20 NOTE — Plan of Care (Incomplete)
 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  Vital signs on arrival: Temperature 98.4 F, pulse 94, respiratory rate 16, blood pressure 136/92, and SpO2 100% on room air.  CBC notable for WBC count 7.3, hemoglobin 13.3, and platelet count 148k.  No significant abnormalities on CMP and LFTs normal.  Lipase normal.  CT abdomen pelvis with contrast showing new hazy fat stranding about the distal infrarenal abdominal aorta at the level of the inferior mesenteric artery which is favored to represent acute aortic injury in the setting of trauma but no intramural hematoma or dissection flap seen.  Per radiologist, inflammatory or infectious aortitis is also on the differential and recommended vascular surgery consult.  CT also showing mixed density atherosclerotic plaque throughout the aorta and its mesenteric, renal, and iliac branches, with similar occlusion or near occlusion of the left common iliac artery.  Patient was given IV morphine .  ED PA discussed the case with vascular surgeon Dr. Serene who requested admission to [facility] by hospitalist service, holding Lovenox , and repeating CT abdomen pelvis in 24 hours.  Vascular surgery will consult in the morning.

## 2024-11-20 NOTE — Telephone Encounter (Signed)
 YOU ASKED FOR: Service Description Code 1 Code 2 Plan Requested  Dates Requested  Amount LIDODERM  5% Patch [id number] Medicaid 11/19/2024 to  11/19/2025 360 WE DENIED: Service Description Code 1 Code 2 Plan Denied Dates Denied  Amount LIDODERM  5% Patch [id number] Medicaid 11/19/2024 to  11/19/2025 360 We denied your request for:  [id number] LIDODERM  5% Patch Policy rules found at [facility] Prior Approval Criteria [employer]  guided our decision. Here are the policy requirements your request did not meet:   You must have one of the following conditions:  o Neuropathic pain (nerve pain that can cause burning, shooting, pricking, pins and needles)  o Chronic musculo-skeletal pain for at least 6 months (pain in your bones or joints)  You must have tried and failed or have a medical reason why your doctor can't treat your pain   with at least one (1) drug from two of the drug classes below:  o Tri-cyclic antidepressants (such as amitriptyline)  o SSRI (such as sertraline and generic Prozac)  o SNRI (such as duloxetine (generic Cymbalta))  o Anticonvulsants (such as gabapentin, pregabalin (generics of Neurontin and Lyrica))  o Non-Steroidal Anti-Inflammatory Drugs often called NSAIDs (such as ibuprofen )  o COX II's (such as celecoxib (generic Celebrex))

## 2024-11-20 NOTE — ED Provider Notes (Signed)
  EMERGENCY DEPARTMENT AT [facility] Provider Note   CSN: [id number] Arrival date & time: 11/20/24  1552     Patient presents with: Todd Estrada is a 63 y.o. 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  Patient lives at home with his cousin [name] who reports that patient was pulling up his bridges following using commode next to bed when patient fell into the wooden desk onto his left side of abdomen. The table caught patient from falling onto ground per cousin. This was all witnessed and he denies head injury, LOC, seizure, concerns/complaints prior to fall. Last took lovenox  11/16/24 per cousin. Recently had medication refilled but has not picked up yet.    Fall Associated symptoms include abdominal pain.       Prior to Admission medications   Medication Sig Start Date End Date Taking? Authorizing Provider  albuterol  (PROVENTIL ) (2.5 MG/3ML) 0.083% nebulizer solution Take 3 mLs (2.5 mg total) by nebulization every 4 (four) hours as needed for wheezing or shortness of breath. 08/16/22   Lou Claretta HERO, MD  atorvastatin  (LIPITOR) 40 MG tablet Take 1 tablet (40 mg total) by mouth daily. 08/14/24 08/14/25  [name], [name], DO  diclofenac  Sodium (VOLTAREN ) 1 % GEL Apply 2 g topically 4 (four) times daily. 11/18/24   Nguyen, Diana, MD  enoxaparin  (LOVENOX ) 100 MG/ML injection Inject 0.85 mLs (85 mg total) into the skin every 12 (twelve) hours. 11/18/24   Nguyen, Diana, MD  lidocaine  (LIDODERM ) 5 % Place 1 patch onto the skin daily. Remove & Discard patch within 12 hours or as directed by MD 11/18/24   Leontine Lapine, MD  Multiple Vitamin (MULTIVITAMIN WITH MINERALS) TABS tablet Take 1 tablet by mouth daily.    [provider]  ondansetron   (ZOFRAN -ODT) 4 MG disintegrating tablet Take 1 tablet (4 mg total) by mouth every 8 (eight) hours as needed for nausea or vomiting. 10/17/24   Minnie Tinnie BRAVO, PA  pantoprazole  (PROTONIX ) 40 MG tablet TAKE 1 TABLET(40 MG) BY MOUTH DAILY 07/08/24   Tawkaliyar, Roya, DO  sucralfate  (CARAFATE ) 1 g tablet TAKE 1 TABLET(1 GRAM) BY MOUTH FOUR TIMES DAILY( WITH MEALS AND AT BEDTIME) 09/19/24   Tobie Gaines, DO  Tiotropium Bromide-Olodaterol (STIOLTO RESPIMAT ) 2.5-2.5 MCG/ACT AERS NEW PRESCRIPTION REQUEST: STIOLTO 2.5 MCG- INHALE TWO PUFFS BY MOUTH DAILY 08/14/24   [name], [name], DO    Allergies: Patient has no known allergies.    Review of Systems  Gastrointestinal:  Positive for abdominal pain.    Updated Vital Signs BP 138/89   Pulse 90   Temp 98.4 F (36.9 C) (Oral)   Resp 17   Ht 6' 1 (1.854 m)   Wt 88 kg   SpO2 100%   BMI 25.60 kg/m   Physical Exam Vitals and nursing note reviewed.  Constitutional:      General: He is not in acute distress.    Appearance: Normal appearance. He is not diaphoretic.     Comments: No signs of distress. Ambulates with wheelchair  HENT:     Head: Normocephalic and atraumatic.     Comments: No hematoma nor TTP of cranium No crepitus to facial bones    Right Ear: External ear normal. No hemotympanum.     Left Ear: External ear normal. No hemotympanum.  Nose: Nose normal.     Right Nostril: No epistaxis or septal hematoma.     Left Nostril: No epistaxis or septal hematoma.     Mouth/Throat:     Mouth: Mucous membranes are moist. No injury or lacerations.  Eyes:     General:        Right eye: No discharge.        Left eye: No discharge.     Extraocular Movements: Extraocular movements intact.     Conjunctiva/sclera: Conjunctivae normal.     Pupils: Pupils are equal, round, and reactive to light.     Comments: No subconjunctival hemorrhage, hyphema, tear drop pupil, or fluid leakage bilaterally  Neck:     Vascular: No carotid bruit.   Cardiovascular:     Rate and Rhythm: Normal rate.     Pulses: Normal pulses.          Radial pulses are 2+ on the right side and 2+ on the left side.       Dorsalis pedis pulses are 2+ on the right side and 2+ on the left side.  Pulmonary:     Effort: Pulmonary effort is normal. No respiratory distress.     Breath sounds: Normal breath sounds. No wheezing.  Chest:     Chest wall: No tenderness.  Abdominal:     General: Bowel sounds are normal. There is no distension.     Palpations: Abdomen is soft.     Tenderness: There is abdominal tenderness in the left upper quadrant and left lower quadrant. There is no guarding or rebound.     Comments: Nonsurgical abdomen with no peritoneal signs. No rigidity nor ecchymosis to abdomen   Musculoskeletal:     Cervical back: Full passive range of motion without pain, normal range of motion and neck supple. No deformity, rigidity or bony tenderness. Normal range of motion.     Thoracic back: No deformity or bony tenderness. Normal range of motion.     Lumbar back: No deformity or bony tenderness. Normal range of motion.     Right hip: No bony tenderness or crepitus.     Left hip: No bony tenderness or crepitus.     Right lower leg: No edema.     Left lower leg: No edema.     Comments: No obvious deformity to joints or long bones Pelvis stable with no shortening or rotation of LE bilaterally  Skin:    General: Skin is warm and dry.     Capillary Refill: Capillary refill takes less than 2 seconds.     Comments: No ecchymosis to chest, abdomen, back  Neurological:     General: No focal deficit present.     Mental Status: He is alert. Mental status is at baseline.     GCS: GCS eye subscore is 4. GCS verbal subscore is 5. GCS motor subscore is 6.     Cranial Nerves: Cranial nerves 2-12 are intact. No cranial nerve deficit.     Sensory: Sensation is intact. No sensory deficit.     Motor: Motor function is intact. No weakness, tremor or seizure  activity.     Coordination: Coordination is intact. Coordination normal. Finger-Nose-Finger Test and Heel to [facility] Test normal.     Gait: Gait is intact.     Deep Tendon Reflexes: Reflexes are normal and symmetric.     Comments: Dysphagia from previous stoke able to respond yes and no verbally and nodding. Neuro exam limited d/t dysphgia. Unable to  answer A&O questions. following commands appropriately - at baseline per family     (all labs ordered are listed, but only abnormal results are displayed) Labs Reviewed  CBC WITH DIFFERENTIAL/PLATELET - Abnormal; Notable for the following components:      Result Value   Platelets 148 (*)    All other components within normal limits  COMPREHENSIVE METABOLIC PANEL WITH GFR - Abnormal; Notable for the following components:   Glucose, Bld 104 (*)    All other components within normal limits  LIPASE, BLOOD    EKG: None  Radiology: CT ABDOMEN PELVIS W CONTRAST Result Date: 11/20/2024 EXAM: CT ABDOMEN AND PELVIS WITH CONTRAST 11/20/2024 06:32:40 PM TECHNIQUE: CT of the abdomen and pelvis was performed with the administration of 100 mL of iohexol  (OMNIPAQUE ) 300 MG/ML solution. Multiplanar reformatted images are provided for review. Automated exposure control, iterative reconstruction, and/or weight-based adjustment of the mA/kV was utilized to reduce the radiation dose to as low as reasonably achievable. COMPARISON: Comparison with 10/16/2024. CLINICAL HISTORY: Abdominal trauma, blunt. FINDINGS: LOWER CHEST: No acute abnormality. LIVER: The liver is unremarkable. GALLBLADDER AND BILE DUCTS: Cholecystectomy. No biliary ductal dilatation. SPLEEN: No acute abnormality. PANCREAS: No acute abnormality. ADRENAL GLANDS: No acute abnormality. KIDNEYS, URETERS AND BLADDER: No stones in the kidneys or ureters. No hydronephrosis. No perinephric or periureteral stranding. Urinary bladder is unremarkable. GI AND BOWEL: Stomach demonstrates no acute abnormality.  Moderate colonic stool burden. Normal appendix. There is no bowel obstruction. PERITONEUM AND RETROPERITONEUM: No ascites. No free air. VASCULATURE: Mixed density atherosclerotic plaque throughout the aorta and its mesenteric, renal, and iliac artery branches. 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 There is new hazy fat stranding about the distal infrarenal abdominal aorta at the level of the inferior mesenteric artery ( series 2 image 39 ). The inferior mesenteric artery is not well opacified and may be occluded. No evidence of intramural hematoma or dissection. LYMPH NODES: No lymphadenopathy. REPRODUCTIVE ORGANS: No acute abnormality. BONES AND SOFT TISSUES: No acute osseous abnormality. No focal soft tissue abnormality. IMPRESSION: 1. New hazy fat stranding about the distal infrarenal abdominal aorta at the level of the inferior mesenteric artery. This is favored to represent acute aortic injury in the setting of trauma. No intramural hematoma or dissection flap. Primary differential consideration is inflammatory or infectious aortitis. Vascular surgery consult is recommended. 2. Mixed density atherosclerotic plaque throughout the aorta and its mesenteric, renal, and iliac artery branches, with similar occlusion or near occlusion of the left common iliac artery. Electronically signed by: Norman Gatlin MD 11/20/2024 07:54 PM EST RP Workstation: [id number]      Medications Ordered in the ED  morphine  (PF) 4 MG/ML injection 4 mg (4 mg Intravenous Given 11/20/24 1658)  iohexol  (OMNIPAQUE ) 300 MG/ML solution 100 mL (100 mLs Intravenous Contrast Given 11/20/24 1816)    Clinical Course as of 11/20/24 2250  Wed Nov 20, 2024  1629 Called [name] Ronde listed on patient's emergency contact with no patient identifiers to obtain more information regarding fall today. Unfortunately did not answer so I left a VM for him to call me back. Nursing spoke to him earlier and reported no head  injury, LOC. Patient has been at baseline [LB]  2247 Called [name] Ronde who verified 2 patient demographics per HIPAA. Endorses patient was at baseline prior to fall. Witnessed. No head injury nor LOC. Last took lovenox  11/16/24. Informed him of ED workup, disposition, transfer to [facility] for admission, CTA. He expressed understanding and  agrees with plan [LB]    Clinical Course User Index [LB] Minnie Tinnie BRAVO, PA                                 Medical Decision Making Amount and/or Complexity of Data Reviewed Labs: ordered. Radiology: ordered.  Risk Prescription drug management. Decision regarding hospitalization.    Patient presents to the ED for concern of left sided abd pain, this involves an extensive number of treatment options, and is a complaint that carries with it a high risk of complications and morbidity.  The differential diagnosis includes vascular injury, diverticulitis, splenic laceration, hemodynamic instability, spine trauma. Nonexhaustive list   Co morbidities that complicate the patient evaluation  On lovenox  Aphasic from previous CVA   Additional history obtained:  Additional history obtained from Nursing and Outside Medical Records   External records from outside source obtained and reviewed including triage RN note, most recent CT in 09/2024   Lab Tests:  I Ordered, and personally interpreted labs.  The pertinent results include:   CBG 104 PLT 148   Imaging Studies ordered:  I ordered imaging studies including CT abd pelvis w contrast  I independently visualized and interpreted imaging which showed  New hazy fat stranding about the distal infrarenal abdominal aorta at the level of the inferior mesenteric artery. This is favored to represent acute aortic injury in the setting of trauma. No intramural hematoma or dissection flap. Primary differential consideration is inflammatory or infectious aortitis. Vascular surgery consult is recommended. Mixed  density atherosclerotic plaque throughout the aorta and its mesenteric, renal, and iliac artery branches, with similar occlusion or near occlusion of the left common iliac artery - unchanged from previous CT I agree with the radiologist interpretation    Medicines ordered and prescription drug management:  I ordered medication including morphine   for abd pain  Reevaluation of the patient after these medicines showed that the patient improved I have reviewed the patients home medicines and have made adjustments as needed   Consultations Obtained:  I requested consultation with vascular surgery Dr. Serene,  and discussed lab and imaging findings as well as pertinent plan - they recommend:  Admission for CTA of abdomen tomorrow Will consult on pt during admission I requested consultation with hospitalist Dr. Alfornia,  and discussed lab and imaging findings as well as pertinent plan - accepts patient for admission at [facility]   Problem List / ED Course:  Fall Abd pain Tenderness to LUQ and LLQ. No eccyhmosis to abdomen. No ecchymosis to chest nor back.  Low suspicion of intrathoracic traumatic injury.  No tenderness to palpation of spine.  Do not see any injury to face, signs of basilar skull fracture.  Fall witnessed by family who denies head injury, LOC.  Neuroexam is extremely limited secondary to aphasia, left-sided deficits at baseline from previous stroke. No rigidity nor ecchymosis to abdomen. Hemodynamically stable. No anemia Morphine  in ED for pain  CT shows concern for inferior mesenteric artery injury. Discussed this with vascular sx who recommend CTA of abdomen tomorrow and will consult during admission Per pt's cousin who lives with pt and assists him with med management, he reports pt has not been on lovenox  for past few days since running out of prescription and they have not picked it up yet. Will continue to hold this in setting of concern for arterial injury Will admit patient  to [facility] for continued eval, CT  tomorrow, vasc sx consult   Reevaluation:  After the interventions noted above, I reevaluated the patient and found that they have :stayed the same     Dispostion:  After consideration of the diagnostic results and the patients response to treatment, I feel that the patent would benefit from admission to [facility] for CTA to ensure no aortic or arterial injury.   Discussed ED workup, dispo with pt and pt's cousin who express understanding and agree with plan. All questions answered to their satisfaction  Final diagnoses:  Fall, initial encounter  Left sided abdominal pain  Injury of inferior mesenteric artery, initial encounter    ED Discharge Orders     None        Minnie Tinnie BRAVO, PA 11/20/24 2258    [name], MD 11/21/24 [phone]

## 2024-11-21 ENCOUNTER — Encounter (HOSPITAL_COMMUNITY): Payer: Self-pay | Admitting: Internal Medicine

## 2024-11-21 ENCOUNTER — Observation Stay (HOSPITAL_COMMUNITY)

## 2024-11-21 DIAGNOSIS — W19XXXA Unspecified fall, initial encounter: Secondary | ICD-10-CM | POA: Diagnosis present

## 2024-11-21 DIAGNOSIS — S3500XA Unspecified injury of abdominal aorta, initial encounter: Secondary | ICD-10-CM | POA: Diagnosis present

## 2024-11-21 DIAGNOSIS — R1084 Generalized abdominal pain: Secondary | ICD-10-CM | POA: Diagnosis not present

## 2024-11-21 DIAGNOSIS — S35239A Unspecified injury of inferior mesenteric artery, initial encounter: Secondary | ICD-10-CM | POA: Diagnosis present

## 2024-11-21 DIAGNOSIS — R109 Unspecified abdominal pain: Secondary | ICD-10-CM | POA: Diagnosis present

## 2024-11-21 DIAGNOSIS — Z743 Need for continuous supervision: Secondary | ICD-10-CM | POA: Diagnosis not present

## 2024-11-21 MED ORDER — IOHEXOL 300 MG/ML  SOLN
100.0000 mL | Freq: Once | INTRAMUSCULAR | Status: AC | PRN
  Administered 2024-11-22: 100 mL via INTRAVENOUS

## 2024-11-21 MED ORDER — ALBUTEROL SULFATE (2.5 MG/3ML) 0.083% IN NEBU: 2.5000 mg | INHALATION_SOLUTION | RESPIRATORY_TRACT | Status: DC | PRN

## 2024-11-21 MED ORDER — NALOXONE HCL 0.4 MG/ML IJ SOLN: 0.4000 mg | INTRAMUSCULAR | Status: DC | PRN

## 2024-11-21 MED ORDER — PANTOPRAZOLE SODIUM 40 MG PO TBEC
40.0000 mg | DELAYED_RELEASE_TABLET | Freq: Every day | ORAL | Status: DC
  Administered 2024-11-21 – 2024-11-24 (×4): 40 mg via ORAL
  Filled 2024-11-21 (×4): qty 1

## 2024-11-21 MED ORDER — MORPHINE SULFATE (PF) 2 MG/ML IV SOLN
1.0000 mg | INTRAVENOUS | Status: DC | PRN
  Administered 2024-11-21 – 2024-11-23 (×2): 1 mg via INTRAVENOUS
  Filled 2024-11-21 (×2): qty 1

## 2024-11-21 MED ORDER — ATORVASTATIN CALCIUM 40 MG PO TABS
40.0000 mg | ORAL_TABLET | Freq: Every day | ORAL | Status: DC
  Administered 2024-11-21 – 2024-11-24 (×4): 40 mg via ORAL
  Filled 2024-11-21 (×4): qty 1

## 2024-11-21 MED ORDER — ARFORMOTEROL TARTRATE 15 MCG/2ML IN NEBU
15.0000 ug | INHALATION_SOLUTION | Freq: Two times a day (BID) | RESPIRATORY_TRACT | Status: DC
  Administered 2024-11-21 – 2024-11-23 (×5): 15 ug via RESPIRATORY_TRACT
  Filled 2024-11-21 (×6): qty 2

## 2024-11-21 MED ORDER — ORAL CARE MOUTH RINSE: 15.0000 mL | OROMUCOSAL | Status: DC | PRN

## 2024-11-21 MED ORDER — ACETAMINOPHEN 325 MG PO TABS
650.0000 mg | ORAL_TABLET | Freq: Four times a day (QID) | ORAL | Status: DC | PRN
  Administered 2024-11-23 – 2024-11-24 (×2): 650 mg via ORAL
  Filled 2024-11-21 (×2): qty 2

## 2024-11-21 MED ORDER — UMECLIDINIUM BROMIDE 62.5 MCG/ACT IN AEPB
1.0000 | INHALATION_SPRAY | Freq: Every day | RESPIRATORY_TRACT | Status: DC
  Administered 2024-11-22 – 2024-11-24 (×3): 1 via RESPIRATORY_TRACT
  Filled 2024-11-21: qty 7

## 2024-11-21 MED ORDER — OXYCODONE HCL 5 MG PO TABS
5.0000 mg | ORAL_TABLET | Freq: Four times a day (QID) | ORAL | Status: DC | PRN
  Administered 2024-11-21 – 2024-11-22 (×2): 5 mg via ORAL
  Filled 2024-11-21 (×2): qty 1

## 2024-11-21 MED ORDER — ACETAMINOPHEN 650 MG RE SUPP: 650.0000 mg | Freq: Four times a day (QID) | RECTAL | Status: DC | PRN

## 2024-11-21 MED ORDER — SODIUM CHLORIDE 0.9 % IV SOLN: INTRAVENOUS | Status: AC

## 2024-11-21 NOTE — ED Notes (Signed)
 Adjusted patient HOB

## 2024-11-21 NOTE — Progress Notes (Signed)
 PROGRESS NOTE    Todd Estrada  FMW:[id number] DOB: 09-Jan-1961 DOA: 11/20/2024 PCP: [name], [name], DO    Brief Narrative:   Todd Estrada is a 63 y.o. 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 Per ED report, he lives at home with his cousin who reported that the patient was pulling up his breeches following using commode next to his bed when he fell on a wooden desk onto the left side of his abdomen.  The table caught the patient from falling onto the ground.  This was witnessed by the patient's cousin and no head injury or loss of consciousness reported.  Patient last took Lovenox  on 11/16/2024 per cousin.  He had medication refilled but has not picked up yet. Patient is currently resting comfortably and denies any abdominal or chest pain.  He has no complaints.   In the ED, Temperature 98.4 F, pulse 94, respiratory rate 16, blood pressure 136/92, and SpO2 100% on room air.  CBC notable for WBC count 7.3, hemoglobin 13.3, and platelet count 148k.  No significant abnormalities on CMP and LFTs normal.  Lipase normal.  CT abdomen pelvis with contrast showing new hazy fat stranding about the distal infrarenal abdominal aorta at the level of the inferior mesenteric artery which is favored to represent acute aortic injury in the setting of trauma but no intramural hematoma or dissection flap seen.  Per radiologist, inflammatory or infectious aortitis is also on the differential and recommended vascular surgery consult.  CT also showing mixed density atherosclerotic plaque throughout the aorta and its mesenteric, renal, and iliac  branches, with similar occlusion or near occlusion of the left common iliac artery.  Patient was given IV morphine .  ED PA discussed the case with vascular surgeon Dr. Serene who requested admission to [facility] by hospitalist service, holding Lovenox , and repeating CT abdomen pelvis in 24 hours.    Pending transfer to [facility]  Assessment & Plan:   Acute traumatic aortic injury Patient injured the left side of his abdomen secondary to a mechanical fall at home where he fell on a wooden table.  Hemodynamically stable.  Hemoglobin at baseline.  CT abdomen pelvis with contrast showing new hazy fat stranding about the distal infrarenal abdominal aorta at the level of the inferior mesenteric artery which is favored to represent acute aortic injury in the setting of trauma but no intramural hematoma or dissection flap seen.  Per radiologist, inflammatory or infectious aortitis is also on the differential.   -- Vascular surgery consulted, holding home Lovenox  -- Repeat CT abdomen pelvis pending at 1800 today -- NPO -- Pending transfer to [facility] for vascular surgery evaluation    PAD CT showing mixed density atherosclerotic plaque throughout the aorta and its mesenteric, renal, and iliac branches, with similar occlusion or near occlusion of the left common iliac artery.   -- Vascular surgery consulted as above.   Paroxysmal A-fib History of PE in July 2025 -- Hold Lovenox  as above.   Hyperlipidemia History of CVA with resultant expressive aphasia, right-sided weakness -- Continue Lipitor.   Seizure disorder Per review of PCP note, it seems patient has been off Keppra  since 04/2024 with no recurrence of  seizures.  He was given outpatient neurology referral.   GERD -- Continue Protonix .   Asthma/COPD Stable, no signs of acute exacerbation.   -- Continue home inhalers.   Malignant neoplasm of right upper lobe of lung Status post chemoradiation and currently under  observation.  Outpatient oncology follow-up.   DVT prophylaxis: SCDs Start: 11/21/24 0343    Code Status: Full Code Family Communication: No family present at bedside this morning  Disposition Plan:  Level of care: Progressive Status is: Observation The patient will require care spanning > 2 midnights and should be moved to inpatient because: Needs further surveillance of acute aortic injury, vascular surgery consult    Consultants:  Vascular surgery, Dr. Serene  Procedures:  None  Antimicrobials:  None   Subjective: Patient seen examined bedside, lying in bed.  Remains in the ED hallway awaiting transfer to [facility].  Complaining of some mild left lower quadrant tenderness.  No other complaints or questions at this time.  Denies headache, no visual changes, no chest pain, no palpitations, no fever/chills/night sweats, no nausea/vomiting/diarrhea.  No acute concerns overnight per nurse staff.  Objective: Vitals:   11/21/24 1100 11/21/24 1115 11/21/24 1130 11/21/24 1145  BP: 137/86 (!) 119/100 129/80 (!) 149/77  Pulse: 89     Resp: 17 19 17 16   Temp:      TempSrc:      SpO2: (!) 87%     Weight:      Height:       No intake or output data in the 24 hours ending 11/21/24 1157 Filed Weights   11/20/24 1608  Weight: 88 kg    Examination:  Physical Exam: GEN: NAD, alert and oriented x 3, chronically ill appearance, noted expressive aphasia HEENT: NCAT, PERRL, EOMI, sclera clear, MMM PULM: CTAB w/o wheezes/crackles, normal respiratory effort, on room air CV: RRR w/o M/G/R GI: abd soft, NTND, NABS, no R/G/M MSK: no peripheral edema, noted right sided weakness, chronic NEURO: No focal neurologic deficits other than chronic expressive aphasia and right-sided weakness PSYCH: normal mood/affect Integumentary: No concerning rashes/lesions/wounds no exposed skin surfaces    Data Reviewed: I have personally reviewed following labs and imaging studies  CBC: Recent  Labs  Lab 11/20/24 1716  WBC 7.3  NEUTROABS 5.3  HGB 13.3  HCT 41.0  MCV 92.3  PLT 148*   Basic Metabolic Panel: Recent Labs  Lab 11/20/24 1716  NA 138  K 4.2  CL 101  CO2 25  GLUCOSE 104*  BUN 18  CREATININE 0.89  CALCIUM  9.8   GFR: Estimated Creatinine Clearance: 94.8 mL/min (by C-G formula based on SCr of 0.89 mg/dL). Liver Function Tests: Recent Labs  Lab 11/20/24 1716  AST 19  ALT 20  ALKPHOS 99  BILITOT 0.7  PROT 7.6  ALBUMIN  4.2   Recent Labs  Lab 11/20/24 1716  LIPASE 17   No results for input(s): AMMONIA in the last 168 hours. Coagulation Profile: No results for input(s): INR, PROTIME in the last 168 hours. Cardiac Enzymes: No results for input(s): CKTOTAL, CKMB, CKMBINDEX, TROPONINI in the last 168 hours. BNP (last 3 results) No results for input(s): PROBNP in the last 8760 hours. HbA1C: No results for input(s): HGBA1C in the last 72 hours. CBG: No results for input(s): GLUCAP in the last 168 hours. Lipid Profile: No results for input(s): CHOL, HDL, LDLCALC, TRIG, CHOLHDL, LDLDIRECT in the last 72 hours. Thyroid Function Tests: No results for input(s): TSH, T4TOTAL, FREET4, T3FREE, THYROIDAB in the last  72 hours. Anemia Panel: No results for input(s): VITAMINB12, FOLATE, FERRITIN, TIBC, IRON, RETICCTPCT in the last 72 hours. Sepsis Labs: No results for input(s): PROCALCITON, LATICACIDVEN in the last 168 hours.  No results found for this or any previous visit (from the past 240 hours).       Radiology Studies: CT ABDOMEN PELVIS W CONTRAST Result Date: 11/20/2024 EXAM: CT ABDOMEN AND PELVIS WITH CONTRAST 11/20/2024 06:32:40 PM TECHNIQUE: CT of the abdomen and pelvis was performed with the administration of 100 mL of iohexol  (OMNIPAQUE ) 300 MG/ML solution. Multiplanar reformatted images are provided for review. Automated exposure control, iterative reconstruction, and/or weight-based  adjustment of the mA/kV was utilized to reduce the radiation dose to as low as reasonably achievable. COMPARISON: Comparison with 10/16/2024. CLINICAL HISTORY: Abdominal trauma, blunt. FINDINGS: LOWER CHEST: No acute abnormality. LIVER: The liver is unremarkable. GALLBLADDER AND BILE DUCTS: Cholecystectomy. No biliary ductal dilatation. SPLEEN: No acute abnormality. PANCREAS: No acute abnormality. ADRENAL GLANDS: No acute abnormality. KIDNEYS, URETERS AND BLADDER: No stones in the kidneys or ureters. No hydronephrosis. No perinephric or periureteral stranding. Urinary bladder is unremarkable. GI AND BOWEL: Stomach demonstrates no acute abnormality. Moderate colonic stool burden. Normal appendix. There is no bowel obstruction. PERITONEUM AND RETROPERITONEUM: No ascites. No free air. VASCULATURE: Mixed density atherosclerotic plaque throughout the aorta and its mesenteric, renal, and iliac artery branches. 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 There is new hazy fat stranding about the distal infrarenal abdominal aorta at the level of the inferior mesenteric artery ( series 2 image 39 ). The inferior mesenteric artery is not well opacified and may be occluded. No evidence of intramural hematoma or dissection. LYMPH NODES: No lymphadenopathy. REPRODUCTIVE ORGANS: No acute abnormality. BONES AND SOFT TISSUES: No acute osseous abnormality. No focal soft tissue abnormality. IMPRESSION: 1. New hazy fat stranding about the distal infrarenal abdominal aorta at the level of the inferior mesenteric artery. This is favored to represent acute aortic injury in the setting of trauma. No intramural hematoma or dissection flap. Primary differential consideration is inflammatory or infectious aortitis. Vascular surgery consult is recommended. 2. Mixed density atherosclerotic plaque throughout the aorta and its mesenteric, renal, and iliac artery branches, with similar occlusion or near occlusion of the  left common iliac artery. Electronically signed by: Norman Gatlin MD 11/20/2024 07:54 PM EST RP Workstation: [id number]        Scheduled Meds:  arformoterol   15 mcg Nebulization BID   And   umeclidinium bromide   1 puff Inhalation Daily   atorvastatin   40 mg Oral Daily   pantoprazole   40 mg Oral Daily   Continuous Infusions:  sodium chloride  125 mL/hr at 11/21/24 0944     LOS: 0 days    Time spent: 51 minutes spent on 11/21/2024 caring for this patient face-to-face including chart review, ordering labs/tests, documenting, discussion with nursing staff, consultants, updating family and interview/physical exam    Camellia PARAS Lariya Kinzie, DO [facility] Available via Epic secure chat 7am-7pm After these hours, please refer to coverage provider listed on [email or web address] 11/21/2024, 11:57 AM

## 2024-11-21 NOTE — H&P (Signed)
 History and Physical    Todd Estrada FMW:[id number] DOB: Aug 18, 1961 DOA: 11/20/2024  PCP: [name], [name], DO  Chief Complaint: Abdominal pain  HPI: Todd Estrada is a 63 y.o. 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 Per ED report, he lives at home with his cousin who reported that the patient was pulling up his breeches following using commode next to his bed when he fell on a wooden desk onto the left side of his abdomen.  The table caught the patient from falling onto the ground.  This was witnessed by the patient's cousin and no head injury or loss of consciousness reported.  Patient last took Lovenox  on 11/16/2024 per cousin.  He had medication refilled but has not picked up yet.  Patient is currently resting comfortably and denies any abdominal or chest pain.  He has no complaints.  ED Course: Vital signs on arrival: Temperature 98.4 F, pulse 94, respiratory rate 16, blood pressure 136/92, and SpO2 100% on room air.  CBC notable for WBC count 7.3, hemoglobin 13.3, and platelet count 148k.  No significant abnormalities on CMP and LFTs normal.  Lipase normal.  CT abdomen pelvis with contrast showing new hazy fat stranding about the distal infrarenal abdominal aorta at the level of the inferior mesenteric artery which is favored to represent acute aortic injury in the setting of trauma but no intramural hematoma or dissection flap seen.  Per radiologist, inflammatory or infectious aortitis is also on the differential and recommended vascular surgery consult.  CT also showing mixed density atherosclerotic plaque throughout the aorta  and its mesenteric, renal, and iliac branches, with similar occlusion or near occlusion of the left common iliac artery.  Patient was given IV morphine .  ED PA discussed the case with vascular surgeon Dr. Serene who requested admission to [facility] by hospitalist service, holding Lovenox , and repeating CT abdomen pelvis in 24 hours.  Vascular surgery will consult in the morning.   Review of Systems:  Review of Systems  All other systems reviewed and are negative.   Past Medical History:  Diagnosis Date   Asthma    Atypical chest pain 08/13/2022   Community acquired pneumonia 09/14/2022   COPD (chronic obstructive pulmonary disease) (HCC)    Essential hypertension 08/19/2021   GERD (gastroesophageal reflux disease)    HAP (hospital-acquired pneumonia) 09/16/2022   History of tracheostomy    03/09/22-04/11/22   HLD (hyperlipidemia)    Hypertension    Hypokalemia 08/13/2022   Hypomagnesemia 08/13/2022   Lung cancer (HCC)    PAD (peripheral artery disease)    Seizure disorder (HCC)    Seizures (HCC) 06/02/2022   Sepsis (HCC) 08/13/2022   Sepsis due to pneumonia (HCC) 04/13/2023   Stroke (HCC) 02/2022    Past Surgical History:  Procedure Laterality Date   BRONCHIAL BIOPSY  05/30/2022   Procedure: BRONCHIAL BIOPSIES;  Surgeon: Shelah Lamar RAMAN, MD;  Location: [facility] ENDOSCOPY;  Service: Pulmonary;;   BRONCHIAL BRUSHINGS  05/30/2022   Procedure: BRONCHIAL BRUSHINGS;  Surgeon: Shelah Lamar RAMAN, MD;  Location: [facility] ENDOSCOPY;  Service: Pulmonary;;   BRONCHIAL NEEDLE ASPIRATION BIOPSY  05/30/2022   Procedure: BRONCHIAL NEEDLE ASPIRATION BIOPSIES;  Surgeon: Shelah Lamar RAMAN, MD;  Location: [facility] ENDOSCOPY;  Service: Pulmonary;;   BRONCHIAL WASHINGS  05/30/2022   Procedure: BRONCHIAL WASHINGS;  Surgeon: Shelah Lamar RAMAN, MD;  Location: [facility] ENDOSCOPY;  Service: Pulmonary;;   CHOLECYSTECTOMY N/A 07/05/2023   Procedure: LAPAROSCOPIC CHOLECYSTECTOMY WITH INTRAOPERATIVE CHOLANGIOGRAM;  Surgeon: Sheldon Standing, MD;  Location: [facility] ORS;  Service: General;  Laterality: N/A;   ERCP N/A 07/06/2023   Procedure: ENDOSCOPIC RETROGRADE CHOLANGIOPANCREATOGRAPHY (ERCP);  Surgeon: Rosalie Kitchens, MD;  Location: [facility] ENDOSCOPY;  Service: Gastroenterology;  Laterality: N/A;   ESOPHAGOGASTRODUODENOSCOPY (EGD) WITH PROPOFOL  N/A 03/11/2022   Procedure: ESOPHAGOGASTRODUODENOSCOPY (EGD) WITH PROPOFOL ;  Surgeon: Sebastian Moles, MD;  Location: [facility] ENDOSCOPY;  Service: General;  Laterality: N/A;   FIDUCIAL MARKER PLACEMENT  05/30/2022   Procedure: FIDUCIAL MARKER PLACEMENT;  Surgeon: Shelah Lamar RAMAN, MD;  Location: [facility] ENDOSCOPY;  Service: Pulmonary;;   IR ANGIO INTRA EXTRACRAN SEL COM CAROTID INNOMINATE UNI R MOD SED  03/02/2022   IR CT HEAD LTD  03/02/2022   IR PERCUTANEOUS ART THROMBECTOMY/INFUSION INTRACRANIAL INC DIAG ANGIO  03/02/2022   PEG PLACEMENT N/A 03/11/2022   Procedure: PERCUTANEOUS ENDOSCOPIC GASTROSTOMY (PEG) PLACEMENT;  Surgeon: Sebastian Moles, MD;  Location: [facility] ENDOSCOPY;  Service: General;  Laterality: N/A;   RADIOLOGY WITH ANESTHESIA N/A 03/02/2022   Procedure: IR WITH ANESTHESIA;  Surgeon: Dolphus Carrion, MD;  Location: [facility] OR;  Service: Radiology;  Laterality: N/A;   REMOVAL OF STONES  07/06/2023   Procedure: REMOVAL OF STONES;  Surgeon: Rosalie Kitchens, MD;  Location: [facility] ENDOSCOPY;  Service: Gastroenterology;;   ANNETT  07/06/2023   Procedure: ANNETT;  Surgeon: Rosalie Kitchens, MD;  Location: [facility] ENDOSCOPY;  Service: Gastroenterology;;   VIDEO BRONCHOSCOPY WITH RADIAL ENDOBRONCHIAL ULTRASOUND  05/30/2022   Procedure: VIDEO BRONCHOSCOPY WITH RADIAL ENDOBRONCHIAL ULTRASOUND;  Surgeon: Shelah Lamar RAMAN, MD;  Location: [facility] ENDOSCOPY;  Service: Pulmonary;;     reports that he quit smoking about 2 years ago. His smoking use included cigarettes. He has never been exposed to tobacco smoke. He has never used smokeless tobacco. He reports that he does not currently use alcohol. He reports that he does not use  drugs.  No Known Allergies  Family History  Problem Relation Age of Onset   Throat cancer Mother    Liver cancer Father    Kidney failure Sister    Cancer - Lung Paternal Uncle     Prior to Admission medications   Medication Sig Start Date End Date Taking? Authorizing Provider  albuterol  (PROVENTIL ) (2.5 MG/3ML) 0.083% nebulizer solution Take 3 mLs (2.5 mg total) by nebulization every 4 (four) hours as needed for wheezing or shortness of breath. 08/16/22   Lou Claretta HERO, MD  atorvastatin  (LIPITOR) 40 MG tablet Take 1 tablet (40 mg total) by mouth daily. 08/14/24 08/14/25  [name], [name], DO  diclofenac  Sodium (VOLTAREN ) 1 % GEL Apply 2 g topically 4 (four) times daily. 11/18/24   Nguyen, Diana, MD  enoxaparin  (LOVENOX ) 100 MG/ML injection Inject 0.85 mLs (85 mg total) into the skin every 12 (twelve) hours. 11/18/24   Nguyen, Diana, MD  lidocaine  (LIDODERM ) 5 % Place 1 patch onto the skin daily. Remove & Discard patch within 12 hours or as directed by MD 11/18/24   Leontine Lapine, MD  Multiple Vitamin (MULTIVITAMIN WITH MINERALS) TABS tablet Take 1 tablet by mouth daily.    [provider]  ondansetron  (ZOFRAN -ODT) 4 MG disintegrating tablet Take 1 tablet (4 mg total) by mouth every 8 (eight) hours as needed for nausea or vomiting. 10/17/24  Minnie Tinnie BRAVO, PA  pantoprazole  (PROTONIX ) 40 MG tablet TAKE 1 TABLET(40 MG) BY MOUTH DAILY 07/08/24   Tawkaliyar, Roya, DO  sucralfate  (CARAFATE ) 1 g tablet TAKE 1 TABLET(1 GRAM) BY MOUTH FOUR TIMES DAILY( WITH MEALS AND AT BEDTIME) 09/19/24   Tobie Gaines, DO  Tiotropium Bromide-Olodaterol (STIOLTO RESPIMAT ) 2.5-2.5 MCG/ACT AERS NEW PRESCRIPTION REQUEST: STIOLTO 2.5 MCG- INHALE TWO PUFFS BY MOUTH DAILY 08/14/24   [name], [name], DO    Physical Exam: Vitals:   11/20/24 2006 11/20/24 2308 11/21/24 0153 11/21/24 0220  BP: 138/89 118/76  104/76  Pulse: 90 (!) 101  84  Resp: 17 17  17   Temp:   99.1 F (37.3 C)   TempSrc:   Oral   SpO2:  100% 100%  94%  Weight:      Height:        Physical Exam Vitals reviewed.  Constitutional:      General: He is not in acute distress. HENT:     Head: Normocephalic and atraumatic.  Eyes:     Extraocular Movements: Extraocular movements intact.  Cardiovascular:     Rate and Rhythm: Normal rate and regular rhythm.     Heart sounds: Normal heart sounds.  Pulmonary:     Effort: Pulmonary effort is normal. No respiratory distress.     Breath sounds: Normal breath sounds.  Abdominal:     General: Bowel sounds are normal.     Palpations: Abdomen is soft.     Tenderness: There is abdominal tenderness. There is no guarding.     Comments: Mild left upper and lower quadrant tenderness  Musculoskeletal:     Cervical back: Normal range of motion.  Skin:    General: Skin is warm and dry.  Neurological:     Mental Status: He is alert and oriented to person, place, and time. Mental status is at baseline.     Comments: Expressive aphasia and right sided weakness (chronic per patient)     Labs on Admission: I have personally reviewed following labs and imaging studies  CBC: Recent Labs  Lab 11/20/24 1716  WBC 7.3  NEUTROABS 5.3  HGB 13.3  HCT 41.0  MCV 92.3  PLT 148*   Basic Metabolic Panel: Recent Labs  Lab 11/20/24 1716  NA 138  K 4.2  CL 101  CO2 25  GLUCOSE 104*  BUN 18  CREATININE 0.89  CALCIUM  9.8   GFR: Estimated Creatinine Clearance: 94.8 mL/min (by C-G formula based on SCr of 0.89 mg/dL). Liver Function Tests: Recent Labs  Lab 11/20/24 1716  AST 19  ALT 20  ALKPHOS 99  BILITOT 0.7  PROT 7.6  ALBUMIN  4.2   Recent Labs  Lab 11/20/24 1716  LIPASE 17   No results for input(s): AMMONIA in the last 168 hours. Coagulation Profile: No results for input(s): INR, PROTIME in the last 168 hours. Cardiac Enzymes: No results for input(s): CKTOTAL, CKMB, CKMBINDEX, TROPONINI in the last 168 hours. BNP (last 3 results) No results for input(s):  PROBNP in the last 8760 hours. HbA1C: No results for input(s): HGBA1C in the last 72 hours. CBG: No results for input(s): GLUCAP in the last 168 hours. Lipid Profile: No results for input(s): CHOL, HDL, LDLCALC, TRIG, CHOLHDL, LDLDIRECT in the last 72 hours. Thyroid Function Tests: No results for input(s): TSH, T4TOTAL, FREET4, T3FREE, THYROIDAB in the last 72 hours. Anemia Panel: No results for input(s): VITAMINB12, FOLATE, FERRITIN, TIBC, IRON, RETICCTPCT in the last 72 hours. Urine analysis:  Component Value Date/Time   COLORURINE YELLOW 10/17/2024 0102   APPEARANCEUR CLEAR 10/17/2024 0102   LABSPEC 1.036 (H) 10/17/2024 0102   PHURINE 6.0 10/17/2024 0102   GLUCOSEU NEGATIVE 10/17/2024 0102   HGBUR NEGATIVE 10/17/2024 0102   BILIRUBINUR NEGATIVE 10/17/2024 0102   KETONESUR NEGATIVE 10/17/2024 0102   PROTEINUR NEGATIVE 10/17/2024 0102   NITRITE NEGATIVE 10/17/2024 0102   LEUKOCYTESUR NEGATIVE 10/17/2024 0102    Radiological Exams on Admission: CT ABDOMEN PELVIS W CONTRAST Result Date: 11/20/2024 EXAM: CT ABDOMEN AND PELVIS WITH CONTRAST 11/20/2024 06:32:40 PM TECHNIQUE: CT of the abdomen and pelvis was performed with the administration of 100 mL of iohexol  (OMNIPAQUE ) 300 MG/ML solution. Multiplanar reformatted images are provided for review. Automated exposure control, iterative reconstruction, and/or weight-based adjustment of the mA/kV was utilized to reduce the radiation dose to as low as reasonably achievable. COMPARISON: Comparison with 10/16/2024. CLINICAL HISTORY: Abdominal trauma, blunt. FINDINGS: LOWER CHEST: No acute abnormality. LIVER: The liver is unremarkable. GALLBLADDER AND BILE DUCTS: Cholecystectomy. No biliary ductal dilatation. SPLEEN: No acute abnormality. PANCREAS: No acute abnormality. ADRENAL GLANDS: No acute abnormality. KIDNEYS, URETERS AND BLADDER: No stones in the kidneys or ureters. No hydronephrosis. No perinephric  or periureteral stranding. Urinary bladder is unremarkable. GI AND BOWEL: Stomach demonstrates no acute abnormality. Moderate colonic stool burden. Normal appendix. There is no bowel obstruction. PERITONEUM AND RETROPERITONEUM: No ascites. No free air. VASCULATURE: Mixed density atherosclerotic plaque throughout the aorta and its mesenteric, renal, and iliac artery branches. 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 There is new hazy fat stranding about the distal infrarenal abdominal aorta at the level of the inferior mesenteric artery ( series 2 image 39 ). The inferior mesenteric artery is not well opacified and may be occluded. No evidence of intramural hematoma or dissection. LYMPH NODES: No lymphadenopathy. REPRODUCTIVE ORGANS: No acute abnormality. BONES AND SOFT TISSUES: No acute osseous abnormality. No focal soft tissue abnormality. IMPRESSION: 1. New hazy fat stranding about the distal infrarenal abdominal aorta at the level of the inferior mesenteric artery. This is favored to represent acute aortic injury in the setting of trauma. No intramural hematoma or dissection flap. Primary differential consideration is inflammatory or infectious aortitis. Vascular surgery consult is recommended. 2. Mixed density atherosclerotic plaque throughout the aorta and its mesenteric, renal, and iliac artery branches, with similar occlusion or near occlusion of the left common iliac artery. Electronically signed by: Norman Gatlin MD 11/20/2024 07:54 PM EST RP Workstation: [id number]    Assessment and Plan  Acute traumatic aortic injury Patient injured the left side of his abdomen secondary to a mechanical fall at home where he fell on a wooden table.  Hemodynamically stable.  Hemoglobin at baseline.  CT abdomen pelvis with contrast showing new hazy fat stranding about the distal infrarenal abdominal aorta at the level of the inferior mesenteric artery which is favored to represent acute  aortic injury in the setting of trauma but no intramural hematoma or dissection flap seen.  Per radiologist, inflammatory or infectious aortitis is also on the differential.  Vascular surgery consulted, appreciate recommendations.  Hold Lovenox .  Repeat CT abdomen pelvis in 24 hours ordered.  Will keep n.p.o. for now except sips with meds until patient is evaluated by vascular surgery in the morning.  IV fluid hydration.  Continue pain management.  PAD CT showing mixed density atherosclerotic plaque throughout the aorta and its mesenteric, renal, and iliac branches, with similar occlusion or near occlusion of the left common iliac  artery.  Vascular surgery consulted as above.  Paroxysmal A-fib History of PE in July 2025 Hold Lovenox  as above.  Hyperlipidemia History of stroke Continue Lipitor.  Seizure disorder Per review of PCP note, it seems patient has been off Keppra  since 04/2024 with no recurrence of seizures.  He was given outpatient neurology referral.  GERD Continue Protonix .  Asthma/COPD Stable, no signs of acute exacerbation.  Continue home inhalers.  Malignant neoplasm of right upper lobe of lung Status post chemoradiation and currently under observation.  Outpatient oncology follow-up.  DVT prophylaxis: SCDs Code Status: Patient was previously listed as DNR but at present when I discussed CODE STATUS with him, he expressed his desire to remain full code.  No family available at this time.  Please confirm in the morning when patient's nephew is available. Family Communication: No family available at this time. Consults called: Vascular surgery Level of care: Progressive Care Unit Admission status: It is my clinical opinion that referral for OBSERVATION is reasonable and necessary in this patient based on the above information provided. The aforementioned taken together are felt to place the patient at high risk for further clinical deterioration. However, it is anticipated  that the patient may be medically stable for discharge from the hospital within 24 to 48 hours.  Editha Ram MD [facility]  If 7PM-7AM, please contact night-coverage [email or web address]  11/21/2024, 3:01 AM

## 2024-11-21 NOTE — Progress Notes (Signed)
 Patient arrived to 4E, oriented to unit, and CCMD notified. BP 123/72 (BP Location: Left Arm)   Pulse 83   Temp 98.1 F (36.7 C) (Oral)   Resp 15   Ht 6' 1 (1.854 m)   Wt 88 kg   SpO2 98%   BMI 25.60 kg/m

## 2024-11-21 NOTE — Consult Note (Addendum)
 [facility]ist of [facility]  Patient name: Todd Estrada MRN: [id number] DOB: February 25, 1961 Sex: male   REQUESTING PROVIDER:   ER   REASON FOR CONSULT:    Abdominal pain  HISTORY OF PRESENT ILLNESS:   Todd Estrada is a 63 y.o. male, who presented to the emergency department with left-sided abdominal pain after a fall.  Reportedly he was at home with his cousin and while getting up off of the toilet he fell on a wooden desk to the left side of his abdomen.  This was a witnessed fall.  There was no loss of consciousness or head injury.  In the emergency department, he underwent a CT scan that showed a hazy fat stranding around the distal infrarenal aorta at the level of the inferior mesenteric artery favored to represent an acute aortic injury.  The patient does have chronic abdominal pain but he states that this is somewhat different.  Patient has a history of stroke with expressive aphasia and right-sided weakness.  He also has lung cancer status post chemoradiation.  He is anticoagulated for atrial fibrillation with Lovenox .  He also has a history of PE in July of this year.  He takes a statin for hypercholesterolemia.  PAST MEDICAL HISTORY    Past Medical History:  Diagnosis Date   Asthma    Atypical chest pain 08/13/2022   Community acquired pneumonia 09/14/2022   COPD (chronic obstructive pulmonary disease) (HCC)    Essential hypertension 08/19/2021   GERD (gastroesophageal reflux disease)    HAP (hospital-acquired pneumonia) 09/16/2022   History of tracheostomy    03/09/22-04/11/22   HLD (hyperlipidemia)    Hypertension    Hypokalemia 08/13/2022   Hypomagnesemia 08/13/2022   Lung cancer (HCC)    PAD (peripheral artery disease)    Seizure disorder (HCC)    Seizures (HCC) 06/02/2022   Sepsis (HCC) 08/13/2022   Sepsis due to pneumonia (HCC) 04/13/2023   Stroke (HCC) 02/2022     FAMILY HISTORY   Family History  Problem  Relation Age of Onset   Throat cancer Mother    Liver cancer Father    Kidney failure Sister    Cancer - Lung Paternal Uncle     SOCIAL HISTORY:   Social History   Socioeconomic History   Marital status: Widowed    Spouse name: Not on file   Number of children: Not on file   Years of education: Not on file   Highest education level: Not on file  Occupational History   Not on file  Tobacco Use   Smoking status: Former    Current packs/day: 0.00    Types: Cigarettes    Quit date: 05/02/2022    Years since quitting: 2.5    Passive exposure: Never   Smokeless tobacco: Never  Vaping Use   Vaping status: Never Used  Substance and Sexual Activity   Alcohol use: Not Currently   Drug use: No   Sexual activity: Not Currently  Other Topics Concern   Not on file  Social History Narrative   Not on file   Social Drivers of Health   Financial Resource Strain: Not on file  Food Insecurity: No Food Insecurity (07/02/2024)   Hunger Vital Sign    Worried About Running Out of Food in the Last Year: Never true    Ran Out of Food in the Last Year: Never true  Transportation Needs: No Transportation Needs (07/02/2024)   PRAPARE - Transportation    Lack  of Transportation (Medical): No    Lack of Transportation (Non-Medical): No  Physical Activity: Not on file  Stress: Not on file  Social Connections: Socially Isolated (04/26/2024)   Social Connection and Isolation Panel    Frequency of Communication with Friends and Family: Once a week    Frequency of Social Gatherings with Friends and Family: Once a week    Attends Religious Services: Never    Database Administrator or Organizations: No    Attends Banker Meetings: Never    Marital Status: Widowed  Intimate Partner Violence: Not At Risk (07/02/2024)   Humiliation, Afraid, Rape, and Kick questionnaire    Fear of Current or Ex-Partner: No    Emotionally Abused: No    Physically Abused: No    Sexually Abused: No     ALLERGIES:    No Known Allergies  CURRENT MEDICATIONS:    Current Facility-Administered Medications  Medication Dose Route Frequency Provider Last Rate Last Admin   acetaminophen  (TYLENOL ) tablet 650 mg  650 mg Oral Q6H PRN Alfornia Madison, MD       Or   acetaminophen  (TYLENOL ) suppository 650 mg  650 mg Rectal Q6H PRN Alfornia Madison, MD       albuterol  (PROVENTIL ) (2.5 MG/3ML) 0.083% nebulizer solution 2.5 mg  2.5 mg Nebulization Q4H PRN Alfornia Madison, MD       arformoterol  (BROVANA ) nebulizer solution 15 mcg  15 mcg Nebulization BID Alfornia Madison, MD       And   umeclidinium bromide  (INCRUSE ELLIPTA ) 62.5 MCG/ACT 1 puff  1 puff Inhalation Daily Alfornia Madison, MD       atorvastatin  (LIPITOR) tablet 40 mg  40 mg Oral Daily Rathore, Vasundhra, MD   40 mg at 11/21/24 9056   iohexol  (OMNIPAQUE ) 300 MG/ML solution 100 mL  100 mL Intravenous Once PRN Alfornia Madison, MD       morphine  (PF) 2 MG/ML injection 1 mg  1 mg Intravenous Q4H PRN Rathore, Vasundhra, MD       naloxone (NARCAN) injection 0.4 mg  0.4 mg Intravenous PRN Alfornia Madison, MD       oxyCODONE  (Oxy IR/ROXICODONE ) immediate release tablet 5 mg  5 mg Oral Q6H PRN Alfornia Madison, MD       pantoprazole  (PROTONIX ) EC tablet 40 mg  40 mg Oral Daily Alfornia Madison, MD   40 mg at 11/21/24 9056    REVIEW OF SYSTEMS:   [X]  denotes positive finding, [ ]  denotes negative finding Cardiac  Comments:  Chest pain or chest pressure:    Shortness of breath upon exertion:    Short of breath when lying flat:    Irregular heart rhythm:        Vascular    Pain in calf, thigh, or hip brought on by ambulation:    Pain in feet at night that wakes you up from your sleep:     Blood clot in your veins:    Leg swelling:         Pulmonary    Oxygen at home:    Productive cough:     Wheezing:         Neurologic    Sudden weakness in arms or legs:     Sudden numbness in arms or legs:     Sudden  onset of difficulty speaking or slurred speech:    Temporary loss of vision in one eye:     Problems with dizziness:  Gastrointestinal    Blood in stool:      Vomited blood:         Genitourinary    Burning when urinating:     Blood in urine:        Psychiatric    Major depression:         Hematologic    Bleeding problems:    Problems with blood clotting too easily:        Skin    Rashes or ulcers:        Constitutional    Fever or chills:     PHYSICAL EXAM:   Vitals:   11/21/24 1130 11/21/24 1145 11/21/24 1300 11/21/24 1449  BP: 129/80 (!) 149/77 (!) 142/79 123/72  Pulse:    83  Resp: 17 16 18 15   Temp:    98.1 F (36.7 C)  TempSrc:    Oral  SpO2:    98%  Weight:      Height:        GENERAL: The patient is a well-nourished male, in no acute distress. The vital signs are documented above. CARDIAC: There is a regular rate and rhythm.  PULMONARY: Nonlabored respirations ABDOMEN: Soft.  He does have tenderness on the left side where there is an area of ecchymosis.  MUSCULOSKELETAL: There are no major deformities or cyanosis. NEUROLOGIC: No focal weakness or paresthesias are detected. SKIN: There are no ulcers or rashes noted. PSYCHIATRIC: The patient has a normal affect.  STUDIES:   I have reviewed the following CT scan: 1. New hazy fat stranding about the distal infrarenal abdominal aorta at the level of the inferior mesenteric artery. This is favored to represent acute aortic injury in the setting of trauma. No intramural hematoma or dissection flap. Primary differential consideration is inflammatory or infectious aortitis. Vascular surgery consult is recommended. 2. Mixed density atherosclerotic plaque throughout the aorta and its mesenteric, renal, and iliac artery branches, with similar occlusion or near occlusion of the left common iliac artery.  ASSESSMENT and PLAN   Possible aortic injury: The patient had a fall with ecchymosis now on his  mid left side of his abdomen where he hit a piece of furniture.  His CT scan shows a mild amount of fat stranding around the infrarenal aorta.  There was concern for possible aortic injury.  No surgical intervention is recommended at this time.  I would recommend stopping his Lovenox .  Will plan on a repeat CT angiogram tomorrow, which I have ordered   Malvina Serene CLORE, MD, FACS [facility] [phone] Pager [phone]

## 2024-11-22 ENCOUNTER — Inpatient Hospital Stay (HOSPITAL_COMMUNITY)

## 2024-11-22 DIAGNOSIS — S3500XD Unspecified injury of abdominal aorta, subsequent encounter: Secondary | ICD-10-CM

## 2024-11-22 LAB — BASIC METABOLIC PANEL WITH GFR
Anion gap: 11 (ref 5–15)
BUN: 14 mg/dL (ref 8–23)
CO2: 27 mmol/L (ref 22–32)
Calcium: 8.6 mg/dL — ABNORMAL LOW (ref 8.9–10.3)
Chloride: 100 mmol/L (ref 98–111)
Creatinine, Ser: 1.12 mg/dL (ref 0.61–1.24)
GFR, Estimated: >60 mL/min (ref >60)
Glucose, Bld: 125 mg/dL — ABNORMAL HIGH (ref 70–99)
Potassium: 3.8 mmol/L (ref 3.5–5.1)
Sodium: 138 mmol/L (ref 135–145)

## 2024-11-22 LAB — CBC
HCT: 36.5 % — ABNORMAL LOW (ref 39.0–52.0)
Hemoglobin: 12 g/dL — ABNORMAL LOW (ref 13.0–17.0)
MCH: 29.9 pg (ref 26.0–34.0)
MCHC: 32.9 g/dL (ref 30.0–36.0)
MCV: 90.8 fL (ref 80.0–100.0)
Platelets: 142 K/uL — ABNORMAL LOW (ref 150–400)
RBC: 4.02 MIL/uL — ABNORMAL LOW (ref 4.22–5.81)
RDW: 13.2 % (ref 11.5–15.5)
WBC: 4.6 K/uL (ref 4.0–10.5)
nRBC: 0 % (ref 0.0–0.2)

## 2024-11-22 MED ORDER — HYDRALAZINE HCL 20 MG/ML IJ SOLN: 10.0000 mg | INTRAMUSCULAR | Status: DC | PRN

## 2024-11-22 MED ORDER — IPRATROPIUM-ALBUTEROL 0.5-2.5 (3) MG/3ML IN SOLN: 3.0000 mL | RESPIRATORY_TRACT | Status: DC | PRN

## 2024-11-22 MED ORDER — METOPROLOL TARTRATE 5 MG/5ML IV SOLN
5.0000 mg | INTRAVENOUS | Status: DC | PRN
  Administered 2024-11-23: 5 mg via INTRAVENOUS
  Filled 2024-11-22: qty 5

## 2024-11-22 MED ORDER — ONDANSETRON HCL 4 MG/2ML IJ SOLN: 4.0000 mg | Freq: Four times a day (QID) | INTRAMUSCULAR | Status: DC | PRN

## 2024-11-22 NOTE — TOC CAGE-AID Note (Signed)
 Transition of Care [facility]) - CAGE-AID Screening   Patient Details  Name: Todd Estrada MRN: [id number] Date of Birth: 08-21-1961  Transition of Care [facility]) CM/SW Contact:    Ingrid Shifrin E Aundra Espin, LCSW Phone Number: 11/22/2024, 9:53 AM   Clinical Narrative: Denied SA.  CAGE-AID Screening:    Have You Ever Felt You Ought to Cut Down on Your Drinking or Drug Use?: No Have People Annoyed You By Critizing Your Drinking Or Drug Use?: No Have You Felt Bad Or Guilty About Your Drinking Or Drug Use?: No Have You Ever Had a Drink or Used Drugs First Thing In The Morning to Steady Your Nerves or to Get Rid of a Hangover?: No CAGE-AID Score: 0  Substance Abuse Education Offered: No

## 2024-11-22 NOTE — Progress Notes (Signed)
 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  Has not taken Lovenox  over the last week.  CT abdomen pelvis showed concerns of acute aortic injury infrarenally therefore transferred from [facility] to [facility] for vascular consult.  No acute intervention at this time and recommended to stop Lovenox  and repeating CT angio.   Assessment & Plan:    Acute traumatic aortic injury Wonder if this was from mechanical fall.  Seen by vascular, holding off on Lovenox .  Plans for repeating CT angio today.   PAD Continue statin for now   Paroxysmal A-fib History of PE in July 2025 -Holding Lovenox    Hyperlipidemia History of CVA with resultant expressive aphasia, right-sided weakness -- Continue Lipitor.   Seizure disorder Patient has been off Keppra  since May 2025.  Follow-up outpatient neurology   GERD Protonix  daily   Asthma/COPD Stable, no signs of acute exacerbation.   -As needed bronchodilators   Malignant neoplasm of right upper lobe of lung Status post chemoradiation and currently under observation.  Outpatient oncology follow-up.     DVT prophylaxis: SCDs Start: 11/21/24 0343     Code Status: Full Code Family Communication: No family present at bedside this morning   Disposition Plan:  Continue hospital stay until cleared by vascular     Consultants:  Vascular surgery, Dr. Serene     PT Follow up Recs:   Subjective: No complaints   Examination:  General exam: Appears calm and comfortable  Respiratory system: Clear to auscultation.  Respiratory effort normal. Cardiovascular system: S1 & S2 heard, RRR. No JVD, murmurs, rubs, gallops or clicks. No pedal edema. Gastrointestinal system: Abdomen is nondistended, soft and nontender. No organomegaly or masses felt. Normal bowel sounds heard. Central nervous system: Alert and oriented. No focal neurological deficits. Extremities: Symmetric 5 x 5 power. Skin: No rashes, lesions or ulcers Psychiatry: Judgement and insight appear normal. Mood & affect appropriate.                Diet Orders (From admission, onward)     Start     Ordered   11/21/24 1729  Diet Heart Room service appropriate? Yes; Fluid consistency: Thin  Diet effective now       Question Answer Comment  Room service appropriate? Yes   Fluid consistency: Thin      11/21/24 1728            Objective: Vitals:   11/22/24 0749 11/22/24 0753 11/22/24 0824 11/22/24 1142  BP:   111/69 107/67  Pulse:   87 80  Resp:   16 17  Temp:   98.4 F (36.9 C) 99.2 F (37.3 C)  TempSrc:   Oral Oral  SpO2: 95% 100% 94% 94%  Weight:      Height:        Intake/Output Summary (Last 24 hours) at 11/22/2024 1244 Last data filed at 11/22/2024 1145 Gross per 24 hour  Intake 958.33 ml  Output 2040 ml  Net -1081.67 ml   Filed Weights   11/20/24 1608  Weight: 88 kg    Scheduled Meds:  arformoterol   15 mcg Nebulization  BID   And   umeclidinium bromide   1 puff Inhalation Daily   atorvastatin   40 mg Oral Daily   pantoprazole   40 mg Oral Daily   Continuous Infusions:  Nutritional status     Body mass index is 25.6 kg/m.  Data Reviewed:   CBC: Recent Labs  Lab 11/20/24 1716 11/22/24 0258  WBC 7.3 4.6  NEUTROABS 5.3  --   HGB 13.3 12.0*  HCT 41.0 36.5*  MCV 92.3 90.8  PLT 148* 142*   Basic Metabolic Panel: Recent Labs  Lab 11/20/24 1716 11/22/24 0258  NA 138 138  K 4.2 3.8  CL 101 100  CO2 25 27  GLUCOSE 104* 125*  BUN 18 14  CREATININE 0.89 1.12  CALCIUM  9.8 8.6*    GFR: Estimated Creatinine Clearance: 75.3 mL/min (by C-G formula based on SCr of 1.12 mg/dL). Liver Function Tests: Recent Labs  Lab 11/20/24 1716  AST 19  ALT 20  ALKPHOS 99  BILITOT 0.7  PROT 7.6  ALBUMIN  4.2   Recent Labs  Lab 11/20/24 1716  LIPASE 17   No results for input(s): AMMONIA in the last 168 hours. Coagulation Profile: No results for input(s): INR, PROTIME in the last 168 hours. Cardiac Enzymes: No results for input(s): CKTOTAL, CKMB, CKMBINDEX, TROPONINI in the last 168 hours. BNP (last 3 results) No results for input(s): PROBNP in the last 8760 hours. HbA1C: No results for input(s): HGBA1C in the last 72 hours. CBG: No results for input(s): GLUCAP in the last 168 hours. Lipid Profile: No results for input(s): CHOL, HDL, LDLCALC, TRIG, CHOLHDL, LDLDIRECT in the last 72 hours. Thyroid Function Tests: No results for input(s): TSH, T4TOTAL, FREET4, T3FREE, THYROIDAB in the last 72 hours. Anemia Panel: No results for input(s): VITAMINB12, FOLATE, FERRITIN, TIBC, IRON, RETICCTPCT in the last 72 hours. Sepsis Labs: No results for input(s): PROCALCITON, LATICACIDVEN in the last 168 hours.  No results found for this or any previous visit (from the past 240 hours).       Radiology Studies: CT ABDOMEN PELVIS W CONTRAST Result Date: 11/20/2024 EXAM: CT ABDOMEN AND PELVIS WITH CONTRAST 11/20/2024 06:32:40 PM TECHNIQUE: CT of the abdomen and pelvis was performed with the administration of 100 mL of iohexol  (OMNIPAQUE ) 300 MG/ML solution. Multiplanar reformatted images are provided for review. Automated exposure control, iterative reconstruction, and/or weight-based adjustment of the mA/kV was utilized to reduce the radiation dose to as low as reasonably achievable. COMPARISON: Comparison with 10/16/2024. CLINICAL HISTORY: Abdominal trauma, blunt. FINDINGS: LOWER CHEST: No acute abnormality. LIVER: The liver  is unremarkable. GALLBLADDER AND BILE DUCTS: Cholecystectomy. No biliary ductal dilatation. SPLEEN: No acute abnormality. PANCREAS: No acute abnormality. ADRENAL GLANDS: No acute abnormality. KIDNEYS, URETERS AND BLADDER: No stones in the kidneys or ureters. No hydronephrosis. No perinephric or periureteral stranding. Urinary bladder is unremarkable. GI AND BOWEL: Stomach demonstrates no acute abnormality. Moderate colonic stool burden. Normal appendix. There is no bowel obstruction. PERITONEUM AND RETROPERITONEUM: No ascites. No free air. VASCULATURE: Mixed density atherosclerotic plaque throughout the aorta and its mesenteric, renal, and iliac artery branches. 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 There is new hazy fat stranding about the distal infrarenal abdominal aorta at the level of the inferior mesenteric artery ( series 2 image 39 ). The inferior mesenteric artery is not well opacified and may be occluded. No evidence of intramural hematoma or dissection. LYMPH NODES: No lymphadenopathy. REPRODUCTIVE ORGANS: No acute abnormality. BONES AND SOFT TISSUES: No acute osseous  abnormality. No focal soft tissue abnormality. IMPRESSION: 1. New hazy fat stranding about the distal infrarenal abdominal aorta at the level of the inferior mesenteric artery. This is favored to represent acute aortic injury in the setting of trauma. No intramural hematoma or dissection flap. Primary differential consideration is inflammatory or infectious aortitis. Vascular surgery consult is recommended. 2. Mixed density atherosclerotic plaque throughout the aorta and its mesenteric, renal, and iliac artery branches, with similar occlusion or near occlusion of the left common iliac artery. Electronically signed by: Norman Gatlin MD 11/20/2024 07:54 PM EST RP Workstation: [id number]           LOS: 1 day   Time spent= 35 mins    Burgess JAYSON Dare, MD [facility]  If 7PM-7AM, please contact  night-coverage  11/22/2024, 12:44 PM

## 2024-11-22 NOTE — Progress Notes (Addendum)
  Progress Note    11/22/2024 7:35 AM * No surgery found *  Subjective: Denies any chest pain   Vitals:   11/21/24 2013 11/22/24 0431  BP: 116/66 90/69  Pulse: 93 77  Resp: 16 17  Temp: 98.5 F (36.9 C) 98.2 F (36.8 C)  SpO2: 94% 91%    Physical Exam: General: Resting in bed comfortably, NAD Cardiac: Regular rate and rhythm Lungs: Nonlabored Abdomen: Soft, nondistended, tenderness to left side around area of bruising  CBC    Component Value Date/Time   WBC 4.6 11/22/2024 0258   RBC 4.02 (L) 11/22/2024 0258   HGB 12.0 (L) 11/22/2024 0258   HGB 14.0 10/28/2024 1431   HGB 13.5 07/05/2024 1132   HCT 36.5 (L) 11/22/2024 0258   HCT 42.2 07/05/2024 1132   PLT 142 (L) 11/22/2024 0258   PLT 150 10/28/2024 1431   PLT 206 07/05/2024 1132   MCV 90.8 11/22/2024 0258   MCV 91 07/05/2024 1132   MCH 29.9 11/22/2024 0258   MCHC 32.9 11/22/2024 0258   RDW 13.2 11/22/2024 0258   RDW 13.5 07/05/2024 1132   LYMPHSABS 0.9 11/20/2024 1716   LYMPHSABS 0.8 04/23/2024 1543   MONOABS 1.0 11/20/2024 1716   EOSABS 0.1 11/20/2024 1716   EOSABS 0.0 04/23/2024 1543   BASOSABS 0.0 11/20/2024 1716   BASOSABS 0.0 04/23/2024 1543    BMET    Component Value Date/Time   NA 138 11/22/2024 0258   NA 138 07/05/2024 1132   K 3.8 11/22/2024 0258   CL 100 11/22/2024 0258   CO2 27 11/22/2024 0258   GLUCOSE 125 (H) 11/22/2024 0258   BUN 14 11/22/2024 0258   BUN 19 07/05/2024 1132   CREATININE 1.12 11/22/2024 0258   CREATININE 0.96 10/28/2024 1431   CALCIUM  8.6 (L) 11/22/2024 0258   GFRNONAA >60 11/22/2024 0258   GFRNONAA >60 10/28/2024 1431    INR    Component Value Date/Time   INR 1.1 07/13/2024 1354     Intake/Output Summary (Last 24 hours) at 11/22/2024 0735 Last data filed at 11/21/2024 2156 Gross per 24 hour  Intake 958.33 ml  Output 1140 ml  Net -181.67 ml      Assessment/Plan:  63 y.o. male admitted with possible aortic injury   - He is doing well this morning  without any complaints.  He denies any chest pain. -His abdomen is soft and remains tender on the left around the area of bruising from his fall -Pending repeat CT angiogram today to reevaluate possible aortic injury   Ahmed Holster, PA-C [facility] [phone] 11/22/2024 7:35 AM  I have seen and evaluated the patient. I agree with the PA note as documented above.  Complaining of left hip pain on my evaluation this morning.  Plan repeat CTA today as ordered by Dr. Serene.  Not a very impressive CT at admission.  Lonni DOROTHA Gaskins, MD [facility] [place] Office: [phone]

## 2024-11-22 NOTE — Hospital Course (Addendum)
 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  Has not taken Lovenox  over the last week.  CT abdomen pelvis showed concerns of acute aortic injury infrarenally therefore transferred from [facility] to [facility] for vascular consult.  No acute intervention at this time and recommended to stop Lovenox  and repeating CT angio.   Assessment & Plan:    Acute traumatic aortic injury Wonder if this was from mechanical fall.  Seen by vascular, holding off on Lovenox .  Plans for repeating CT angio today.   PAD Continue statin for now   Paroxysmal A-fib History of PE in July 2025 -Holding Lovenox    Hyperlipidemia History of CVA with resultant expressive aphasia, right-sided weakness -- Continue Lipitor.   Seizure disorder Patient has been off Keppra  since May 2025.  Follow-up outpatient neurology   GERD Protonix  daily   Asthma/COPD Stable, no signs of acute exacerbation.   -As needed bronchodilators   Malignant neoplasm of right upper lobe of lung Status post chemoradiation and currently under observation.  Outpatient oncology follow-up.     DVT prophylaxis: SCDs Start: 11/21/24 0343     Code Status: Full Code Family Communication: No family present at bedside this morning   Disposition Plan:  Continue hospital stay until cleared by vascular     Consultants:  Vascular surgery, Dr. Serene     PT Follow up Recs:   Subjective: No complaints   Examination:  General exam: Appears calm and comfortable  Respiratory system: Clear to auscultation. Respiratory effort normal. Cardiovascular system: S1 & S2 heard, RRR. No JVD, murmurs, rubs, gallops or clicks. No pedal  edema. Gastrointestinal system: Abdomen is nondistended, soft and nontender. No organomegaly or masses felt. Normal bowel sounds heard. Central nervous system: Alert and oriented. No focal neurological deficits. Extremities: Symmetric 5 x 5 power. Skin: No rashes, lesions or ulcers Psychiatry: Judgement and insight appear normal. Mood & affect appropriate.

## 2024-11-23 ENCOUNTER — Other Ambulatory Visit (HOSPITAL_COMMUNITY): Payer: Self-pay

## 2024-11-23 DIAGNOSIS — R109 Unspecified abdominal pain: Secondary | ICD-10-CM | POA: Insufficient documentation

## 2024-11-23 LAB — BASIC METABOLIC PANEL WITH GFR
Anion gap: 8 (ref 5–15)
BUN: 15 mg/dL (ref 8–23)
CO2: 29 mmol/L (ref 22–32)
Calcium: 8.5 mg/dL — ABNORMAL LOW (ref 8.9–10.3)
Chloride: 99 mmol/L (ref 98–111)
Creatinine, Ser: 1.25 mg/dL — ABNORMAL HIGH (ref 0.61–1.24)
GFR, Estimated: >60 mL/min (ref >60)
Glucose, Bld: 114 mg/dL — ABNORMAL HIGH (ref 70–99)
Potassium: 4.1 mmol/L (ref 3.5–5.1)
Sodium: 136 mmol/L (ref 135–145)

## 2024-11-23 LAB — CBC
HCT: 38 % — ABNORMAL LOW (ref 39.0–52.0)
Hemoglobin: 12.6 g/dL — ABNORMAL LOW (ref 13.0–17.0)
MCH: 29.9 pg (ref 26.0–34.0)
MCHC: 33.2 g/dL (ref 30.0–36.0)
MCV: 90.3 fL (ref 80.0–100.0)
Platelets: 144 K/uL — ABNORMAL LOW (ref 150–400)
RBC: 4.21 MIL/uL — ABNORMAL LOW (ref 4.22–5.81)
RDW: 13.1 % (ref 11.5–15.5)
WBC: 6 K/uL (ref 4.0–10.5)
nRBC: 0 % (ref 0.0–0.2)

## 2024-11-23 LAB — PHOSPHORUS: Phosphorus: 4.6 mg/dL (ref 2.5–4.6)

## 2024-11-23 LAB — MAGNESIUM: Magnesium: 1.9 mg/dL (ref 1.7–2.4)

## 2024-11-23 MED ORDER — POLYETHYLENE GLYCOL 3350 17 GM/SCOOP PO POWD
17.0000 g | Freq: Two times a day (BID) | ORAL | 0 refills | Status: AC | PRN
  Filled 2024-11-23: qty 238, 7d supply, fill #0

## 2024-11-23 MED ORDER — POLYETHYLENE GLYCOL 3350 17 G PO PACK
34.0000 g | PACK | Freq: Once | ORAL | Status: AC
  Administered 2024-11-23: 34 g via ORAL
  Filled 2024-11-23: qty 2

## 2024-11-23 MED ORDER — MAGNESIUM CITRATE PO SOLN
1.0000 | Freq: Once | ORAL | Status: AC
  Administered 2024-11-23: 1 via ORAL
  Filled 2024-11-23: qty 296

## 2024-11-23 MED ORDER — SENNOSIDES-DOCUSATE SODIUM 8.6-50 MG PO TABS
2.0000 | ORAL_TABLET | Freq: Two times a day (BID) | ORAL | Status: AC
  Administered 2024-11-23 (×2): 2 via ORAL
  Filled 2024-11-23 (×2): qty 2

## 2024-11-23 MED ORDER — SENNOSIDES-DOCUSATE SODIUM 8.6-50 MG PO TABS: 1.0000 | ORAL_TABLET | Freq: Two times a day (BID) | ORAL | Status: AC | PRN

## 2024-11-23 NOTE — Discharge Summary (Signed)
 Physician Discharge Summary  Todd Estrada FMW:[id number] DOB: [birth year] DOA: 11/20/2024  PCP: [name], [name], DO  Admit date: 11/20/2024 Discharge date: 11/23/24  Admitted From: Home Disposition: Home Recommendations for Outpatient Follow-up:  Outpatient follow-up with PCP in 1 to 2 weeks Vascular surgery to arrange outpatient follow-up. Check CMP and CBC at follow-up Please follow up on the following pending results: None  Home Health: No need identified Equipment/Devices: No new need identified  Discharge Condition: Stable  CODE STATUS: Full code   Follow-up Information     [name], [name], DO. Schedule an appointment as soon as possible for a visit in 1 week(s).   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  Reportedly, fell on wooden desk when he tried to get off the toilet.  No head strike or LOC.  In ED, stable vitals. CT chest, abdomen pelvis raised concerns for acute aortic injury infrarenally.  After consultation with vascular surgery, patient was transferred from [facility] to [facility] for vascular surgery evaluation.   At [facility], patient remained stable.  Evaluated by vascular surgery and had a repeat CT abdomen and pelvis on 11/22/2024 that showed chronic occlusions involving left common iliac, origin of IMA and bilateral SFA, and moderate calcified aortic plaque without aneurysm but with eccentric mural thrombus in juxtarenal and infrarenal segments without high-grade stenosis or dissection.  On the day of discharge, patient was cleared for discharge by vascular  surgery.  No need identified by therapy.  Started on bowel regimen for constipation.  His abdominal pain could be ischemic from occluded IMA as well.  Outpatient follow-up with PCP and vascular surgery as above.  See individual problem list below for more.   Problems addressed during this hospitalization LLQ abdominal pain: Could be constipation or ischemic in the setting of occluded IMA.  He could have muscle sprain from fall as well.  Exam reassuring except for mild tenderness over LLQ.  Low suspicion for infection.  CT abdomen and pelvis as above. -Bowel regimen - Continue statin and Lovenox . - Outpatient follow-up with vascular surgery  PAD: CT angio abdomen and pelvis as above. - Started on Lovenox   History of CVA with residual dense aphasia and right-sided weakness - Continue Lipitor Lovenox .  Seizure disorder: Stable.  Off Keppra  since May 2025. - Outpatient follow-up with neurology.  AKI: Likely due to contrast with CT.  Not on nephrotoxic meds. - Encouraged oral hydration  History of lung cancer s/p chemoradiation. - Outpatient follow-up with oncology  Constipation - Bowel regimen including MiraLAX  and Senokot  Family communication - Updated patient's cousin over the phone.   Body mass index is 25.6 kg/m.           Consultations: Vascular surgery  Time spent 35  minutes  Vital signs Vitals:   11/23/24 0400 11/23/24 0802 11/23/24 1107 11/23/24 1200  BP: 113/73  112/85 107/89  Pulse: 85  100   Temp: 98.1 F (36.7 C)  98.5 F (36.9 C)   Resp: 20  16 20   Height:      Weight:  SpO2: 92% 91% 95% 90%  TempSrc: Oral  Oral   BMI (Calculated):         Discharge exam  GENERAL: No apparent distress.  Nontoxic. HEENT: MMM.  Vision and hearing grossly intact.  NECK: Supple.  No apparent JVD.  RESP:  No IWOB.  Fair aeration bilaterally. CVS:  RRR. Heart sounds normal.  ABD/GI/GU: BS+. Abd soft.  Mild LLQ tenderness. MSK/EXT:  Moves extremities. No  apparent deformity. No edema.  SKIN: no apparent skin lesion or wound NEURO: Awake and alert.  Seems oriented but difficult to assess since patient is basically nonverbal.  Follows commands.  Pronounced right arm weakness.  Some bilateral leg weakness. PSYCH: Calm. Normal affect.   Discharge Instructions Discharge Instructions     Discharge instructions   Complete by: As directed    It has been a pleasure taking care of you!  You were hospitalized due to fall at home.  After the tests we have done, we have not identified any major injury or damage.  Continue home medication.  Maintain good hydration.  Follow-up with your primary care doctor in 1 to 2 weeks or sooner if needed.  Follow-up with vascular surgery per their recommendation.   Take care,   Increase activity slowly   Complete by: As directed       Allergies as of 11/23/2024   No Known Allergies      Medication List     TAKE these medications    albuterol  (2.5 MG/3ML) 0.083% nebulizer solution Commonly known as: PROVENTIL  Take 3 mLs (2.5 mg total) by nebulization every 4 (four) hours as needed for wheezing or shortness of breath.   atorvastatin  40 MG tablet Commonly known as: Lipitor Take 1 tablet (40 mg total) by mouth daily.   diclofenac  Sodium 1 % Gel Commonly known as: VOLTAREN  Apply 2 g topically 4 (four) times daily.   enoxaparin  100 MG/ML injection Commonly known as: LOVENOX  Inject 0.85 mLs (85 mg total) into the skin every 12 (twelve) hours.   lidocaine  5 % Commonly known as: Lidoderm  Place 1 patch onto the skin daily. Remove & Discard patch within 12 hours or as directed by MD   multivitamin with minerals Tabs tablet Take 1 tablet by mouth daily.   ondansetron  4 MG disintegrating tablet Commonly known as: ZOFRAN -ODT Take 1 tablet (4 mg total) by mouth every 8 (eight) hours as needed for nausea or vomiting.   pantoprazole  40 MG tablet Commonly known as: PROTONIX  TAKE 1 TABLET(40 MG) BY MOUTH  DAILY   polyethylene glycol powder 17 GM/SCOOP powder Commonly known as: MiraLax  Take 17 g by mouth 2 (two) times daily as needed for mild constipation.   senna-docusate 8.6-50 MG tablet Commonly known as: Senokot-S Take 1 tablet by mouth 2 (two) times daily between meals as needed for mild constipation.   Stiolto Respimat  2.5-2.5 MCG/ACT Aers Generic drug: Tiotropium Bromide-Olodaterol NEW PRESCRIPTION REQUEST: STIOLTO 2.5 MCG- INHALE TWO PUFFS BY MOUTH DAILY   sucralfate  1 g tablet Commonly known as: CARAFATE  TAKE 1 TABLET(1 GRAM) BY MOUTH FOUR TIMES DAILY( WITH MEALS AND AT BEDTIME)         Procedures/Studies:   CT Angio Abd/Pel w/ and/or w/o Result Date: 11/22/2024 EXAM: CTA ABDOMEN AND PELVIS WITHOUT AND WITH CONTRAST 11/22/2024 12:54:20 PM TECHNIQUE: CTA images of the abdomen and pelvis without and with 100 mL iohexol  (OMNIPAQUE ) 300 MG/ML solution intravenous contrast. Three-dimensional MIP/volume rendered formations were performed. Automated exposure control, iterative reconstruction, and/or weight based adjustment  of the mA/kV was utilized to reduce the radiation dose to as low as reasonably achievable. COMPARISON: 11/20/2024 and previous. CLINICAL HISTORY: Abdominal trauma, blunt. FINDINGS: VASCULATURE: Scattered coronary calcifications. Occlusion of the origin of the right superficial femoral artery (SFA). AORTA: Moderate calcified aortic plaque without aneurysm. Eccentric mural thrombus in the juxtarenal and infrarenal segments without high-grade stenosis. No dissection. CELIAC TRUNK: No acute finding. No occlusion or significant stenosis. SUPERIOR MESENTERIC ARTERY: No acute finding. No occlusion or significant stenosis. RENAL ARTERIES: Duplicated renal arteries bilaterally, inferior moieties dominant, patent. No acute finding. No occlusion or significant stenosis. Probable origin occlusion of the inferior mesenteric artery (IMA), reconstituted distally by visceral  collaterals. ILIAC ARTERIES: Origin occlusion/thrombosis of the left common iliac artery extending through its length. Collateral reconstitution of moderately atheromatous left internal and external iliac arteries. Eccentric calcified plaque in the left common femoral artery resulting in at least mild stenosis. Origin occlusion of the left superficial femoral artery (SFA). On the right, eccentric plaque through the common iliac artery with mild stenosis proximally. Moderate plaque through the external iliac artery resulting in mild stenosis. Origin occlusion of the sfa. LIVER: The liver is unremarkable. GALLBLADDER AND BILE DUCTS: Cholecystectomy clips. No biliary ductal dilatation. SPLEEN: The spleen is unremarkable. PANCREAS: The pancreas is unremarkable. ADRENAL GLANDS: Bilateral adrenal glands demonstrate no acute abnormality. KIDNEYS, URETERS AND BLADDER: No stones in the kidneys or ureters. No hydronephrosis. No perinephric or periureteral stranding. Urinary bladder is unremarkable. GI AND BOWEL: Stomach and duodenal sweep demonstrate no acute abnormality. A few scattered sigmoid diverticula without adjacent inflammatory change. Normal appendix. There is no bowel obstruction. No abnormal bowel wall thickening or distension. REPRODUCTIVE: Mild prostate enlargement. PERITONEUM AND RETRPERITONEUM: No ascites or free air. LUNG BASE: Coarse linear scarring or atelectasis in the lung bases, left greater than right. LYMPH NODES: No lymphadenopathy. BONES AND SOFT TISSUES: Facet degenerative joint disease in the lower lumbar spine. No acute abnormality of the bones. No acute soft tissue abnormality. IMPRESSION: 1. Moderate calcified aortic plaque without aneurysm, with eccentric mural thrombus in the juxtarenal and infrarenal segments without high-grade stenosis or dissection. 2. Probable origin occlusion of the inferior mesenteric artery (IMA), reconstituted distally by visceral collaterals. 3. Origin  occlusion/thrombosis of the left common iliac artery extending through its length, with collateral reconstitution of the left internal and external iliac arteries. 4. Occlusion of bilateral  superficial femoral arteries (SFA). Electronically signed by: Katheleen Faes MD 11/22/2024 05:08 PM EST RP Workstation: [id number]   CT ABDOMEN PELVIS W CONTRAST Result Date: 11/20/2024 EXAM: CT ABDOMEN AND PELVIS WITH CONTRAST 11/20/2024 06:32:40 PM TECHNIQUE: CT of the abdomen and pelvis was performed with the administration of 100 mL of iohexol  (OMNIPAQUE ) 300 MG/ML solution. Multiplanar reformatted images are provided for review. Automated exposure control, iterative reconstruction, and/or weight-based adjustment of the mA/kV was utilized to reduce the radiation dose to as low as reasonably achievable. COMPARISON: Comparison with 10/16/2024. CLINICAL HISTORY: Abdominal trauma, blunt. FINDINGS: LOWER CHEST: No acute abnormality. LIVER: The liver is unremarkable. GALLBLADDER AND BILE DUCTS: Cholecystectomy. No biliary ductal dilatation. SPLEEN: No acute abnormality. PANCREAS: No acute abnormality. ADRENAL GLANDS: No acute abnormality. KIDNEYS, URETERS AND BLADDER: No stones in the kidneys or ureters. No hydronephrosis. No perinephric or periureteral stranding. Urinary bladder is unremarkable. GI AND BOWEL: Stomach demonstrates no acute abnormality. Moderate colonic stool burden. Normal appendix. There is no bowel obstruction. PERITONEUM AND RETROPERITONEUM: No ascites. No free air. VASCULATURE: Mixed density atherosclerotic plaque throughout the aorta and its mesenteric, renal,  and iliac artery branches. 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 There is new hazy fat stranding about the distal infrarenal abdominal aorta at the level of the inferior mesenteric artery ( series 2 image 39 ). The inferior mesenteric artery is not well opacified and may be occluded. No evidence of intramural hematoma or  dissection. LYMPH NODES: No lymphadenopathy. REPRODUCTIVE ORGANS: No acute abnormality. BONES AND SOFT TISSUES: No acute osseous abnormality. No focal soft tissue abnormality. IMPRESSION: 1. New hazy fat stranding about the distal infrarenal abdominal aorta at the level of the inferior mesenteric artery. This is favored to represent acute aortic injury in the setting of trauma. No intramural hematoma or dissection flap. Primary differential consideration is inflammatory or infectious aortitis. Vascular surgery consult is recommended. 2. Mixed density atherosclerotic plaque throughout the aorta and its mesenteric, renal, and iliac artery branches, with similar occlusion or near occlusion of the left common iliac artery. Electronically signed by: Norman Gatlin MD 11/20/2024 07:54 PM EST RP Workstation: [id number]   DG Wrist Complete Left Result Date: 11/13/2024 CLINICAL DATA:  Pain in left wrist. EXAM: LEFT WRIST - COMPLETE 3+ VIEW COMPARISON:  None Available. FINDINGS: No acute fracture or dislocation. There is no evidence of arthropathy or other focal bone abnormality. Soft tissues are unremarkable. IMPRESSION: No acute fracture or dislocation. Electronically Signed   By: Rogelia Myers M.D.   On: 11/13/2024 17:39   VAS US  LOWER EXTREMITY VENOUS REFLUX Result Date: 11/07/2024  Lower Venous Reflux Study Patient Name:  Todd Estrada  Date of Exam:   11/07/2024 Medical Rec #: [id number]      Accession #:    [id number] Date of Birth: 11-06-1961      Patient Gender: M Patient Age:   48 years Exam Location:  [address] Procedure:      VAS US  LOWER EXTREMITY VENOUS REFLUX Referring Phys: FONDA ROBINS --------------------------------------------------------------------------------  Indications: Edema.  Risk Factors: Immobility Cancer Lung DVT Hx. Comparison Study: None. Performing Technologist: Garnette Rockers  Examination Guidelines: A complete evaluation includes B-mode imaging, spectral Doppler, color  Doppler, and power Doppler as needed of all accessible portions of each vessel. Bilateral testing is considered an integral part of a complete examination. Limited examinations for reoccurring indications may be performed as noted. The reflux portion of the exam is performed with the patient in reverse Trendelenburg. Significant venous reflux is defined as >500 ms in the superficial venous system, and >1 second in the deep venous system.  Venous Reflux Times +--------------+---------+------+-----------+------------+-------------+ RIGHT         Reflux NoRefluxReflux TimeDiameter cmsComments                              Yes                                       +--------------+---------+------+-----------+------------+-------------+ CFV                     yes   >1 second                           +--------------+---------+------+-----------+------------+-------------+ FV mid                  yes   >1 second                           +--------------+---------+------+-----------+------------+-------------+  Popliteal     no                                                  +--------------+---------+------+-----------+------------+-------------+ GSV at [facility]              yes    >500 ms      0.93                  +--------------+---------+------+-----------+------------+-------------+ GSV prox thighno                            0.34                  +--------------+---------+------+-----------+------------+-------------+ GSV mid thigh           yes    >500 ms      0.2                   +--------------+---------+------+-----------+------------+-------------+ GSV dist thighno                            0.2                   +--------------+---------+------+-----------+------------+-------------+ GSV at knee   no                            0.18                  +--------------+---------+------+-----------+------------+-------------+ GSV prox calf                                        out of fascia +--------------+---------+------+-----------+------------+-------------+ GSV mid calf                                        out of fascia +--------------+---------+------+-----------+------------+-------------+ SSV at [facility]    no                            0.21                  +--------------+---------+------+-----------+------------+-------------+ SSV prox calf no                            0.18                  +--------------+---------+------+-----------+------------+-------------+   Summary: Right: - No evidence of deep vein thrombosis seen in the right lower extremity, from the common femoral through the popliteal veins. - No evidence of superficial venous thrombosis in the right lower extremity. - Venous reflux is noted in the right common femoral vein. - Venous reflux is noted in the right sapheno-femoral junction. - Venous reflux is noted in the right greater saphenous vein in the thigh. - Venous reflux is noted in the right femoral vein.  *See table(s) above for measurements and observations. Electronically signed by Fonda Rim on 11/07/2024 at 1:42:58 PM.    Final    VAS  US  ABI WITH/WO TBI Result Date: 11/07/2024  LOWER EXTREMITY DOPPLER STUDY Patient Name:  AVRY ROEDL  Date of Exam:   11/07/2024 Medical Rec #: [id number]      Accession #:    [id number] Date of Birth: 1961/10/11      Patient Gender: M Patient Age:   26 years Exam Location:  [address] Procedure:      VAS US  ABI WITH/WO TBI Referring Phys: JOSHUA ROBINS --------------------------------------------------------------------------------  Indications: Peripheral artery disease. High Risk Factors: Hypertension, current smoker, prior CVA.  Comparison Study: 05/09/24 Performing Technologist: Garnette Rockers  Examination Guidelines: A complete evaluation includes at minimum, Doppler waveform signals and systolic blood pressure reading at the level of bilateral brachial,  anterior tibial, and posterior tibial arteries, when vessel segments are accessible. Bilateral testing is considered an integral part of a complete examination. Photoelectric Plethysmograph (PPG) waveforms and toe systolic pressure readings are included as required and additional duplex testing as needed. Limited examinations for reoccurring indications may be performed as noted.  ABI Findings: +---------+------------------+-----+----------+--------+ Right    Rt Pressure (mmHg)IndexWaveform  Comment  +---------+------------------+-----+----------+--------+ Brachial 150                                       +---------+------------------+-----+----------+--------+ PTA      78                0.52 monophasic         +---------+------------------+-----+----------+--------+ DP       68                0.45 monophasic         +---------+------------------+-----+----------+--------+ Great Toe51                0.34 Normal             +---------+------------------+-----+----------+--------+ +---------+------------------+-----+----------+-------+ Left     Lt Pressure (mmHg)IndexWaveform  Comment +---------+------------------+-----+----------+-------+ Brachial 132                                      +---------+------------------+-----+----------+-------+ PTA      70                0.47 monophasic        +---------+------------------+-----+----------+-------+ DP       57                0.38 monophasic        +---------+------------------+-----+----------+-------+ Great Toe0                 0.00 Absent            +---------+------------------+-----+----------+-------+ +-------+-----------+-----------+------------+------------+ ABI/TBIToday's ABIToday's TBIPrevious ABIPrevious TBI +-------+-----------+-----------+------------+------------+ Right  0.52       0.34       0.58        0.44         +-------+-----------+-----------+------------+------------+ Left    0.47       0          0.45        0            +-------+-----------+-----------+------------+------------+  Left TBIs appear decreased compared to prior study on 05/09/24.  Summary: Right: Resting right ankle-brachial index indicates moderate right lower extremity arterial disease. The right toe-brachial index is abnormal.  Left: Resting left ankle-brachial index indicates  severe left lower extremity arterial disease. The left toe-brachial index is abnormal.  *See table(s) above for measurements and observations.  Electronically signed by Fonda Rim on 11/07/2024 at 1:42:52 PM.    Final    CT Chest W Contrast Result Date: 10/28/2024 CLINICAL DATA:  Non-small cell lung cancer, staging. * Tracking Code: BO * EXAM: CT CHEST WITH CONTRAST TECHNIQUE: Multidetector CT imaging of the chest was performed during intravenous contrast administration. RADIATION DOSE REDUCTION: This exam was performed according to the departmental dose-optimization program which includes automated exposure control, adjustment of the mA and/or kV according to patient size and/or use of iterative reconstruction technique. CONTRAST:  75mL OMNIPAQUE  IOHEXOL  300 MG/ML  SOLN COMPARISON:  10/09/2024 FINDINGS: Cardiovascular: Atherosclerotic calcification of the aorta, aortic valve and coronary arteries. Heart size normal. No pericardial effusion. Mediastinum/Nodes: No pathologically enlarged mediastinal or axillary lymph nodes. Hilar regions are difficult to definitively evaluate without IV contrast. Esophagus is grossly unremarkable. Lungs/Pleura: Centrilobular and paraseptal emphysema. Masslike consolidation in the medial lingula measures approximately 2.6 x 3.3 cm (5/71), similar to 10/09/2024. Surrounding architectural distortion and parenchymal retraction, indicative of radiation therapy. Additional parenchymal retraction and architectural distortion centered on a fiducial marker in the lateral right upper lobe, also indicative of  radiation therapy. Calcified granulomas. Scarring in the lung bases. No pleural fluid. Minimal debris in the airway. Upper Abdomen: Visualized portions of the liver, adrenal glands, kidneys, spleen, pancreas, stomach and bowel are grossly unremarkable. No upper abdominal adenopathy. Musculoskeletal: Degenerative changes in the spine. Old lateral right rib fracture. No worrisome lytic or sclerotic lesions. IMPRESSION: 1. Medial left upper lobe mass is stable from 10/09/2024. Surrounding changes of radiation therapy. No evidence of disease progression. 2. Additional changes of radiation therapy in the lateral right upper lobe. 3. Aortic atherosclerosis (ICD10-I70.0). Coronary artery calcification. 4.  Emphysema (ICD10-J43.9). Electronically Signed   By: Newell Eke M.D.   On: 10/28/2024 16:23       The results of significant diagnostics from this hospitalization (including imaging, microbiology, ancillary and laboratory) are listed below for reference.     Microbiology: No results found for this or any previous visit (from the past 240 hours).   Labs:  CBC: Recent Labs  Lab 11/20/24 1716 11/22/24 0258 11/23/24 0330  WBC 7.3 4.6 6.0  NEUTROABS 5.3  --   --   HGB 13.3 12.0* 12.6*  HCT 41.0 36.5* 38.0*  MCV 92.3 90.8 90.3  PLT 148* 142* 144*   BMP &GFR Recent Labs  Lab 11/20/24 1716 11/22/24 0258 11/23/24 0330  NA 138 138 136  K 4.2 3.8 4.1  CL 101 100 99  CO2 25 27 29   GLUCOSE 104* 125* 114*  BUN 18 14 15   CREATININE 0.89 1.12 1.25*  CALCIUM  9.8 8.6* 8.5*  MG  --   --  1.9  PHOS  --   --  4.6   Estimated Creatinine Clearance: 67.5 mL/min (A) (by C-G formula based on SCr of 1.25 mg/dL (H)). Liver & Pancreas: Recent Labs  Lab 11/20/24 1716  AST 19  ALT 20  ALKPHOS 99  BILITOT 0.7  PROT 7.6  ALBUMIN  4.2   Recent Labs  Lab 11/20/24 1716  LIPASE 17   No results for input(s): AMMONIA in the last 168 hours. Diabetic: No results for input(s): HGBA1C in the  last 72 hours. No results for input(s): GLUCAP in the last 168 hours. Cardiac Enzymes: No results for input(s): CKTOTAL, CKMB, CKMBINDEX, TROPONINI in the last 168 hours. No  results for input(s): PROBNP in the last 8760 hours. Coagulation Profile: No results for input(s): INR, PROTIME in the last 168 hours. Thyroid Function Tests: No results for input(s): TSH, T4TOTAL, FREET4, T3FREE, THYROIDAB in the last 72 hours. Lipid Profile: No results for input(s): CHOL, HDL, LDLCALC, TRIG, CHOLHDL, LDLDIRECT in the last 72 hours. Anemia Panel: No results for input(s): VITAMINB12, FOLATE, FERRITIN, TIBC, IRON, RETICCTPCT in the last 72 hours. Urine analysis:    Component Value Date/Time   COLORURINE YELLOW 10/17/2024 0102   APPEARANCEUR CLEAR 10/17/2024 0102   LABSPEC 1.036 (H) 10/17/2024 0102   PHURINE 6.0 10/17/2024 0102   GLUCOSEU NEGATIVE 10/17/2024 0102   HGBUR NEGATIVE 10/17/2024 0102   BILIRUBINUR NEGATIVE 10/17/2024 0102   KETONESUR NEGATIVE 10/17/2024 0102   PROTEINUR NEGATIVE 10/17/2024 0102   NITRITE NEGATIVE 10/17/2024 0102   LEUKOCYTESUR NEGATIVE 10/17/2024 0102   Sepsis Labs: Invalid input(s): PROCALCITONIN, LACTICIDVEN   SIGNED:  Issabelle Mcraney T Ariani Seier, MD  [facility] 11/23/2024, 3:39 PM

## 2024-11-23 NOTE — Progress Notes (Signed)
 Patient was to see therapy and be discharged but complaining of constipation, MD ordered Senna and Miralax  both given, no results, notified MD who then ordered magnesium  citrate, patient still unable to go at this time, complaining of abdominal pain an 8, notified MD, morphine  given. Patient does not want to go until he has bowel movement.

## 2024-11-23 NOTE — Progress Notes (Signed)
 OT Cancellation Note  Patient Details Name: Todd Estrada MRN: [id number] DOB: 09/21/1961   Cancelled Treatment:    Reason Eval/Treat Not Completed: OT screened, no needs identified, will sign off (Discussed with PT. Pt at baseline level of function. OT signing off.)  Duvan Mousel,HILLARY 11/23/2024, 1:22 PM Kreg Sink, OT/L   Acute OT Clinical Specialist Acute Rehabilitation Services Pager [phone] Office [phone]

## 2024-11-23 NOTE — Plan of Care (Signed)

## 2024-11-23 NOTE — Evaluation (Signed)
 Physical Therapy Evaluation Patient Details Name: Todd Estrada MRN: [id number] DOB: 1961-12-08 Today's Date: 11/23/2024  History of Present Illness  63 y.o. male admitted 11/20/24 after fall hitting abdomen against corner of dresser sustaining abdominal pain. Workup negative for traumatic aortic injury. PMH includes CVA (residual R-side weakness, expressive aphasia), lung SCC stage IV (s/p chemoradiation), PAD on Eliquis , hypotension, PE, COPD, PNA, CHF.   Clinical Impression  Pt presents with an overall decrease in functional mobility secondary to above. PTA, pt primarily bedbound, lives with cousin who assists with [facility] and wheelchair transfers, as well as ADL/iADLs. Today, pt requiring modA for stand pivot transfers; reports not having BM since prior to admission (RN notified). Pt preparing for d/c home today. If to remain admitted, will follow acutely to address established goals.    If plan is discharge home, recommend the following: A lot of help with walking and/or transfers;A lot of help with bathing/dressing/bathroom;Assistance with cooking/housework;Assist for transportation;Help with stairs or ramp for entrance   Can travel by private vehicle    Yes (pt declines ambulance transport home)    Equipment Recommendations None recommended by PT  Recommendations for Other Services       Functional Status Assessment Patient has had a recent decline in their functional status and demonstrates the ability to make significant improvements in function in a reasonable and predictable amount of time.     Precautions / Restrictions Precautions Precautions: Fall;Other (comment) Precaution/Restrictions Comments: CVA with residual R-side weakness & aphasia Restrictions Weight Bearing Restrictions Per Provider Order: No      Mobility  Bed Mobility Overal bed mobility: Needs Assistance Bed Mobility: Supine to Sit, Sit to Supine     Supine to sit: Min assist, HOB elevated, Used rails Sit  to supine: Min assist   General bed mobility comments: minA for HHA to elevate trunk, pt able to scoot hips to EOB; minA for RLE management return to supine; pt able to reposition hips and partially scoot up without assist    Transfers Overall transfer level: Needs assistance Equipment used: 1 person hand held assist Transfers: Sit to/from Stand, Bed to chair/wheelchair/BSC Sit to Stand: Mod assist Stand pivot transfers: Mod assist         General transfer comment: heavy reliance on LUE to push to stand, initially falling back to sitting requiring modA to maintain stability with stand pivot bed<>BSC, partially stepping with BLEs though difficult (AFO not donned)    Ambulation/Gait                  Stairs            Wheelchair Mobility     Tilt Bed    Modified Rankin (Stroke Patients Only)       Balance Overall balance assessment: Needs assistance Sitting-balance support: No upper extremity supported, Feet supported Sitting balance-Leahy Scale: Fair     Standing balance support: Single extremity supported, During functional activity Standing balance-Leahy Scale: Poor Standing balance comment: reliant on external assist                             Pertinent Vitals/Pain Pain Assessment Pain Assessment: Faces Faces Pain Scale: Hurts a little bit Pain Location: abdomen Pain Descriptors / Indicators: Discomfort, Grimacing Pain Intervention(s): Monitored during session    Home Living Family/patient expects to be discharged to:: Private residence Living Arrangements: Other relatives Available Help at Discharge: Family;Available PRN/intermittently Type of Home: House Home Access:  Stairs to enter   [employer] of Steps: 1   Home Layout: One level Home Equipment: BSC/3in1;Wheelchair - manual;Tub bench Additional Comments: per chart, lives with cousin Sydnee), pt confirms this    Prior Function Prior Level of Function : Needs  assist             Mobility Comments: spends majority of day in bed, does not walk, cousin assists him to/from [facility]; has R AFO ADLs Comments: assist from family for ADLs, including bathing, dressing, toileting; sounds like bird bathing at edge of bed     Extremity/Trunk Assessment   Upper Extremity Assessment Upper Extremity Assessment: RUE deficits/detail RUE Deficits / Details: h/o CVA with residual RUE weakness; active shoulder AROM <45', PROM to near ~90' though limited by shoulder pain; elbow and hand < 3/5 RUE Coordination: decreased gross motor;decreased fine motor    Lower Extremity Assessment Lower Extremity Assessment: RLE deficits/detail RLE Deficits / Details: h/o CVA with residual RLE weakness; hip < 3/5, able to perform LAQ RLE Coordination: decreased gross motor;decreased fine motor       Communication   Communication Communication: Impaired Factors Affecting Communication: Reduced clarity of speech;Difficulty expressing self (h/o expressive aphasia)    Cognition Arousal: Alert Behavior During Therapy: WFL for tasks assessed/performed   PT - Cognitive impairments: Difficult to assess Difficult to assess due to: Impaired communication                     PT - Cognition Comments: h/o expressive aphasia, seemingly answering yes/no questions appropriately; mumbled speech beyond this Following commands: Intact       Cueing Cueing Techniques: Verbal cues     General Comments General comments (skin integrity, edema, etc.): SpO2 92-96% on RA, HR 109-138 (briefly up to 145 & 147, not sustained), post-mobility BP 126/76. pt unable to have BM on [facility], reports not having once since prior to admission (RN notified). pt declines need for HHPT services, agrees he is at baseline mobility and family able to provide necessary assist    Exercises     Assessment/Plan    PT Assessment Patient needs continued PT services  PT Problem List Decreased  strength;Decreased activity tolerance;Decreased balance;Decreased mobility;Decreased knowledge of use of DME;Cardiopulmonary status limiting activity       PT Treatment Interventions DME instruction;Functional mobility training;Therapeutic activities;Therapeutic exercise;Balance training;Patient/family education;Wheelchair mobility training    PT Goals (Current goals can be found in the Care Plan section)  Acute Rehab PT Goals Patient Stated Goal: return home PT Goal Formulation: With patient Time For Goal Achievement: 12/07/24 Potential to Achieve Goals: Good    Frequency Min 1X/week     Co-evaluation               AM-PAC PT 6 Clicks Mobility  Outcome Measure Help needed turning from your back to your side while in a flat bed without using bedrails?: A Little Help needed moving from lying on your back to sitting on the side of a flat bed without using bedrails?: A Lot Help needed moving to and from a bed to a chair (including a wheelchair)?: A Lot Help needed standing up from a chair using your arms (e.g., wheelchair or bedside chair)?: A Lot Help needed to walk in hospital room?: A Lot Help needed climbing 3-5 steps with a railing? : Total 6 Click Score: 12    End of Session Equipment Utilized During Treatment: Gait belt Activity Tolerance: Patient tolerated treatment well Patient left: in bed;with call  bell/phone within reach;with bed alarm set;with nursing/sitter in room Nurse Communication: Mobility status PT Visit Diagnosis: Other abnormalities of gait and mobility (R26.89)    Time: 8790-8765 PT Time Calculation (min) (ACUTE ONLY): 25 min   Charges:   PT Evaluation $PT Eval Moderate Complexity: 1 Mod PT Treatments $Therapeutic Activity: 8-22 mins PT General Charges $$ ACUTE PT VISIT: 1 Visit       Darice Almas, PT, DPT Acute Rehabilitation Services  Personal: Secure Chat Rehab Office: [phone]  Darice LITTIE Almas 11/23/2024, 12:57 PM

## 2024-11-23 NOTE — Progress Notes (Signed)
 TOC meds delivered to bedside by Discharge RN. @ 1335

## 2024-11-23 NOTE — Progress Notes (Addendum)
  Progress Note    11/23/2024 8:19 AM * No surgery found *  Subjective: About to get a breathing treatment.  Says he is having his normal, chronic abdominal pain currently.  Denies any other pain    Vitals:   11/22/24 2337 11/23/24 0400  BP: 102/71 113/73  Pulse:  85  Resp: 18 20  Temp: 98.3 F (36.8 C) 98.1 F (36.7 C)  SpO2: 92% 92%    Physical Exam: General: Laying in bed, NAD Cardiac: Regular Lungs: Nonlabored Abdomen: Soft, remains tender to the left side  CBC    Component Value Date/Time   WBC 6.0 11/23/2024 0330   RBC 4.21 (L) 11/23/2024 0330   HGB 12.6 (L) 11/23/2024 0330   HGB 14.0 10/28/2024 1431   HGB 13.5 07/05/2024 1132   HCT 38.0 (L) 11/23/2024 0330   HCT 42.2 07/05/2024 1132   PLT 144 (L) 11/23/2024 0330   PLT 150 10/28/2024 1431   PLT 206 07/05/2024 1132   MCV 90.3 11/23/2024 0330   MCV 91 07/05/2024 1132   MCH 29.9 11/23/2024 0330   MCHC 33.2 11/23/2024 0330   RDW 13.1 11/23/2024 0330   RDW 13.5 07/05/2024 1132   LYMPHSABS 0.9 11/20/2024 1716   LYMPHSABS 0.8 04/23/2024 1543   MONOABS 1.0 11/20/2024 1716   EOSABS 0.1 11/20/2024 1716   EOSABS 0.0 04/23/2024 1543   BASOSABS 0.0 11/20/2024 1716   BASOSABS 0.0 04/23/2024 1543    BMET    Component Value Date/Time   NA 136 11/23/2024 0330   NA 138 07/05/2024 1132   K 4.1 11/23/2024 0330   CL 99 11/23/2024 0330   CO2 29 11/23/2024 0330   GLUCOSE 114 (H) 11/23/2024 0330   BUN 15 11/23/2024 0330   BUN 19 07/05/2024 1132   CREATININE 1.25 (H) 11/23/2024 0330   CREATININE 0.96 10/28/2024 1431   CALCIUM  8.5 (L) 11/23/2024 0330   GFRNONAA >60 11/23/2024 0330   GFRNONAA >60 10/28/2024 1431    INR    Component Value Date/Time   INR 1.1 07/13/2024 1354     Intake/Output Summary (Last 24 hours) at 11/23/2024 0819 Last data filed at 11/22/2024 1742 Gross per 24 hour  Intake 720 ml  Output 1700 ml  Net -980 ml      Assessment/Plan:  63 y.o. male admitted with possible aortic  injury   - He is doing well this morning without any complaints.  He continues to deny any chest or lower extremity pain.  He says he is having his usual chronic abdominal pain currently -Abdomen remains soft and tender on the left side around the area of bruising -Repeat CTA without notable aortic injury -No vascular intervention required at this time   Ahmed Holster, PA-C [facility] [phone] 11/23/2024 8:19 AM  I have seen and evaluated the patient. I agree with the PA note as documented above.  Repeat CT yesterday without any obvious traumatic aortic injury on my review.  Noted left common iliac occlusion and is followed for moderate PAD by Dr. Lanis and was just evaluated last month in the office.SABRA  Has follow-up in the office in 1 year with repeat ABIs.  Vascular will sign off.  Lonni DOROTHA Gaskins, MD [facility] [place] Office: [phone]

## 2024-11-24 ENCOUNTER — Inpatient Hospital Stay (HOSPITAL_COMMUNITY)

## 2024-11-24 LAB — RENAL FUNCTION PANEL
Albumin: 3.4 g/dL — ABNORMAL LOW (ref 3.5–5.0)
Anion gap: 10 (ref 5–15)
BUN: 19 mg/dL (ref 8–23)
CO2: 28 mmol/L (ref 22–32)
Calcium: 8.9 mg/dL (ref 8.9–10.3)
Chloride: 99 mmol/L (ref 98–111)
Creatinine, Ser: 1.02 mg/dL (ref 0.61–1.24)
GFR, Estimated: >60 mL/min (ref >60)
Glucose, Bld: 119 mg/dL — ABNORMAL HIGH (ref 70–99)
Phosphorus: 4.3 mg/dL (ref 2.5–4.6)
Potassium: 4.5 mmol/L (ref 3.5–5.1)
Sodium: 137 mmol/L (ref 135–145)

## 2024-11-24 LAB — CBC
HCT: 41.5 % (ref 39.0–52.0)
Hemoglobin: 13.6 g/dL (ref 13.0–17.0)
MCH: 29.5 pg (ref 26.0–34.0)
MCHC: 32.8 g/dL (ref 30.0–36.0)
MCV: 90 fL (ref 80.0–100.0)
Platelets: 144 K/uL — ABNORMAL LOW (ref 150–400)
RBC: 4.61 MIL/uL (ref 4.22–5.81)
RDW: 13.2 % (ref 11.5–15.5)
WBC: 7.3 K/uL (ref 4.0–10.5)
nRBC: 0 % (ref 0.0–0.2)

## 2024-11-24 LAB — MAGNESIUM: Magnesium: 2.4 mg/dL (ref 1.7–2.4)

## 2024-11-24 MED ORDER — POLYETHYLENE GLYCOL 3350 17 G PO PACK
34.0000 g | PACK | Freq: Every day | ORAL | Status: DC
  Administered 2024-11-24: 34 g via ORAL
  Filled 2024-11-24: qty 2

## 2024-11-24 MED ORDER — ENOXAPARIN SODIUM 100 MG/ML IJ SOSY
85.0000 mg | PREFILLED_SYRINGE | Freq: Two times a day (BID) | INTRAMUSCULAR | Status: DC
  Administered 2024-11-24: 85 mg via SUBCUTANEOUS
  Filled 2024-11-24: qty 1

## 2024-11-24 MED ORDER — SENNOSIDES-DOCUSATE SODIUM 8.6-50 MG PO TABS
2.0000 | ORAL_TABLET | Freq: Two times a day (BID) | ORAL | Status: DC
  Administered 2024-11-24: 2 via ORAL
  Filled 2024-11-24: qty 2

## 2024-11-24 NOTE — Discharge Summary (Signed)
 Physician Discharge Summary  Todd Estrada FMW:[id number] DOB: [birth year] DOA: 11/20/2024  PCP: [name], [name], DO  Admit date: 11/20/2024 Discharge date: 11/24/24  Admitted From: Home Disposition: Home Recommendations for Outpatient Follow-up:  Outpatient follow-up with PCP in 1 to 2 weeks Vascular surgery to arrange outpatient follow-up. Check CMP and CBC at follow-up Please follow up on the following pending results: None  Home Health: No need identified Equipment/Devices: No new need identified  Discharge Condition: Stable  CODE STATUS: Full code   Follow-up Information     [name], [name], DO. Schedule an appointment as soon as possible for a visit in 1 week(s).   Specialty: Internal Medicine Contact information: 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  Reportedly, fell on wooden desk when he tried to get off the toilet.  No head strike or LOC.  In ED, stable vitals. CT chest, abdomen pelvis raised concerns for acute aortic injury infrarenally.  After consultation with vascular surgery, patient was transferred from [facility] to [facility] for vascular surgery evaluation.   At [facility], patient remained stable.  Evaluated by vascular surgery and had a repeat CT abdomen and pelvis on 11/22/2024 that showed chronic occlusions involving left common iliac, origin of IMA and bilateral SFA, and moderate calcified aortic plaque without aneurysm but with eccentric mural thrombus in juxtarenal and infrarenal segments without high-grade stenosis or dissection.  On the day of discharge, patient was cleared for discharge by vascular  surgery.  No need identified by therapy.  Started on bowel regimen for constipation.  His abdominal pain could be ischemic from occluded IMA as well.  Outpatient follow-up with PCP and vascular surgery as above.  See individual problem list below for more.   Problems addressed during this hospitalization LLQ abdominal pain: Could be due to constipation, occluded IMA or traumatic injury from fall.  Surprisingly no significant tenderness on exam.  Low suspicion for infection.  CT abdomen and pelvis as above.  Had large bowel movement prior to admission - Continue bowel regimen - Continue statin and Lovenox . - Outpatient follow-up with vascular surgery  PAD: CT angio abdomen and pelvis as above. - Started on Lovenox   History of CVA with residual dense aphasia and right-sided weakness - Continue Lipitor Lovenox .  Seizure disorder: Stable.  Off Keppra  since May 2025. - Outpatient follow-up with neurology.  AKI: Likely due to contrast with CT.  Not on nephrotoxic meds.  Resolved. - Recheck renal function at follow-up.  History of lung cancer s/p chemoradiation. - Outpatient follow-up with oncology  Constipation: Resolved.  KUB without significant finding. - Continue MiraLAX  and Senokot at home  Family communication - Updated patient's cousin over the phone on 12/6.   Body mass index is 25.6 kg/m.           Consultations: Vascular surgery  Time spent 35  minutes  Vital signs Vitals:   11/23/24 2100 11/23/24 2258 11/24/24 0400 11/24/24 0812  BP:  102/67 99/75   Pulse:  70 64   Temp:  98.6 F (37 C) 97.7 F (36.5 C)   Resp: 19 20 16 18   Height:  Weight:      SpO2: 94% 94% 94% 94%  TempSrc:  Oral Oral   BMI (Calculated):         Discharge exam  GENERAL: No apparent distress.  Nontoxic. HEENT: MMM.  Vision and hearing grossly intact.  NECK: Supple.  No apparent JVD.  RESP:  No IWOB.  Fair aeration bilaterally. CVS:  RRR. Heart sounds normal.  ABD/GI/GU:  BS+. Abd soft.  NTND MSK/EXT:  Moves extremities. No apparent deformity. No edema.  SKIN: no apparent skin lesion or wound NEURO: Awake and alert.  Seems oriented but difficult to assess since patient is basically nonverbal.  Follows commands.  Pronounced right arm weakness.  Some bilateral leg weakness. PSYCH: Calm. Normal affect.   Discharge Instructions Discharge Instructions     Discharge instructions   Complete by: As directed    It has been a pleasure taking care of you!  You were hospitalized due to fall at home.  After the tests we have done, we have not identified any major injury or damage.  Continue home medication.  Maintain good hydration.  Follow-up with your primary care doctor in 1 to 2 weeks or sooner if needed.  Follow-up with vascular surgery per their recommendation.   Take care,   Increase activity slowly   Complete by: As directed       Allergies as of 11/24/2024   No Known Allergies      Medication List     TAKE these medications    albuterol  (2.5 MG/3ML) 0.083% nebulizer solution Commonly known as: PROVENTIL  Take 3 mLs (2.5 mg total) by nebulization every 4 (four) hours as needed for wheezing or shortness of breath.   atorvastatin  40 MG tablet Commonly known as: Lipitor Take 1 tablet (40 mg total) by mouth daily.   diclofenac  Sodium 1 % Gel Commonly known as: VOLTAREN  Apply 2 g topically 4 (four) times daily.   enoxaparin  100 MG/ML injection Commonly known as: LOVENOX  Inject 0.85 mLs (85 mg total) into the skin every 12 (twelve) hours.   lidocaine  5 % Commonly known as: Lidoderm  Place 1 patch onto the skin daily. Remove & Discard patch within 12 hours or as directed by MD   multivitamin with minerals Tabs tablet Take 1 tablet by mouth daily.   ondansetron  4 MG disintegrating tablet Commonly known as: ZOFRAN -ODT Take 1 tablet (4 mg total) by mouth every 8 (eight) hours as needed for nausea or vomiting.   pantoprazole  40 MG  tablet Commonly known as: PROTONIX  TAKE 1 TABLET(40 MG) BY MOUTH DAILY   polyethylene glycol powder 17 GM/SCOOP powder Commonly known as: MiraLax  Take 17 g by mouth 2 (two) times daily as needed for mild constipation.   senna-docusate 8.6-50 MG tablet Commonly known as: Senokot-S Take 1 tablet by mouth 2 (two) times daily between meals as needed for mild constipation.   Stiolto Respimat  2.5-2.5 MCG/ACT Aers Generic drug: Tiotropium Bromide-Olodaterol NEW PRESCRIPTION REQUEST: STIOLTO 2.5 MCG- INHALE TWO PUFFS BY MOUTH DAILY   sucralfate  1 g tablet Commonly known as: CARAFATE  TAKE 1 TABLET(1 GRAM) BY MOUTH FOUR TIMES DAILY( WITH MEALS AND AT BEDTIME)         Procedures/Studies:   DG Abd Portable 1V Result Date: 11/24/2024 EXAM: 1 VIEW XRAY OF THE ABDOMEN 11/24/2024 10:26:00 AM COMPARISON: KUB dated 11/07/2023. CLINICAL HISTORY: Constipation. FINDINGS: BOWEL: The abdominal bowel gas pattern is unremarkable. SOFT TISSUES: Surgical clips are noted in the gallbladder fossa. There are moderate vascular calcifications. BONES: No acute fracture.  IMPRESSION: 1. No acute findings. Electronically signed by: Evalene Coho MD 11/24/2024 10:45 AM EST RP Workstation: [id number]   CT Angio Abd/Pel w/ and/or w/o Result Date: 11/22/2024 EXAM: CTA ABDOMEN AND PELVIS WITHOUT AND WITH CONTRAST 11/22/2024 12:54:20 PM TECHNIQUE: CTA images of the abdomen and pelvis without and with 100 mL iohexol  (OMNIPAQUE ) 300 MG/ML solution intravenous contrast. Three-dimensional MIP/volume rendered formations were performed. Automated exposure control, iterative reconstruction, and/or weight based adjustment of the mA/kV was utilized to reduce the radiation dose to as low as reasonably achievable. COMPARISON: 11/20/2024 and previous. CLINICAL HISTORY: Abdominal trauma, blunt. FINDINGS: VASCULATURE: Scattered coronary calcifications. Occlusion of the origin of the right superficial femoral artery (SFA). AORTA:  Moderate calcified aortic plaque without aneurysm. Eccentric mural thrombus in the juxtarenal and infrarenal segments without high-grade stenosis. No dissection. CELIAC TRUNK: No acute finding. No occlusion or significant stenosis. SUPERIOR MESENTERIC ARTERY: No acute finding. No occlusion or significant stenosis. RENAL ARTERIES: Duplicated renal arteries bilaterally, inferior moieties dominant, patent. No acute finding. No occlusion or significant stenosis. Probable origin occlusion of the inferior mesenteric artery (IMA), reconstituted distally by visceral collaterals. ILIAC ARTERIES: Origin occlusion/thrombosis of the left common iliac artery extending through its length. Collateral reconstitution of moderately atheromatous left internal and external iliac arteries. Eccentric calcified plaque in the left common femoral artery resulting in at least mild stenosis. Origin occlusion of the left superficial femoral artery (SFA). On the right, eccentric plaque through the common iliac artery with mild stenosis proximally. Moderate plaque through the external iliac artery resulting in mild stenosis. Origin occlusion of the sfa. LIVER: The liver is unremarkable. GALLBLADDER AND BILE DUCTS: Cholecystectomy clips. No biliary ductal dilatation. SPLEEN: The spleen is unremarkable. PANCREAS: The pancreas is unremarkable. ADRENAL GLANDS: Bilateral adrenal glands demonstrate no acute abnormality. KIDNEYS, URETERS AND BLADDER: No stones in the kidneys or ureters. No hydronephrosis. No perinephric or periureteral stranding. Urinary bladder is unremarkable. GI AND BOWEL: Stomach and duodenal sweep demonstrate no acute abnormality. A few scattered sigmoid diverticula without adjacent inflammatory change. Normal appendix. There is no bowel obstruction. No abnormal bowel wall thickening or distension. REPRODUCTIVE: Mild prostate enlargement. PERITONEUM AND RETRPERITONEUM: No ascites or free air. LUNG BASE: Coarse linear scarring or  atelectasis in the lung bases, left greater than right. LYMPH NODES: No lymphadenopathy. BONES AND SOFT TISSUES: Facet degenerative joint disease in the lower lumbar spine. No acute abnormality of the bones. No acute soft tissue abnormality. IMPRESSION: 1. Moderate calcified aortic plaque without aneurysm, with eccentric mural thrombus in the juxtarenal and infrarenal segments without high-grade stenosis or dissection. 2. Probable origin occlusion of the inferior mesenteric artery (IMA), reconstituted distally by visceral collaterals. 3. Origin occlusion/thrombosis of the left common iliac artery extending through its length, with collateral reconstitution of the left internal and external iliac arteries. 4. Occlusion of bilateral  superficial femoral arteries (SFA). Electronically signed by: Katheleen Faes MD 11/22/2024 05:08 PM EST RP Workstation: [id number]   CT ABDOMEN PELVIS W CONTRAST Result Date: 11/20/2024 EXAM: CT ABDOMEN AND PELVIS WITH CONTRAST 11/20/2024 06:32:40 PM TECHNIQUE: CT of the abdomen and pelvis was performed with the administration of 100 mL of iohexol  (OMNIPAQUE ) 300 MG/ML solution. Multiplanar reformatted images are provided for review. Automated exposure control, iterative reconstruction, and/or weight-based adjustment of the mA/kV was utilized to reduce the radiation dose to as low as reasonably achievable. COMPARISON: Comparison with 10/16/2024. CLINICAL HISTORY: Abdominal trauma, blunt. FINDINGS: LOWER CHEST: No acute abnormality. LIVER: The liver is unremarkable. GALLBLADDER AND BILE DUCTS: Cholecystectomy. No biliary  ductal dilatation. SPLEEN: No acute abnormality. PANCREAS: No acute abnormality. ADRENAL GLANDS: No acute abnormality. KIDNEYS, URETERS AND BLADDER: No stones in the kidneys or ureters. No hydronephrosis. No perinephric or periureteral stranding. Urinary bladder is unremarkable. GI AND BOWEL: Stomach demonstrates no acute abnormality. Moderate colonic stool burden.  Normal appendix. There is no bowel obstruction. PERITONEUM AND RETROPERITONEUM: No ascites. No free air. VASCULATURE: Mixed density atherosclerotic plaque throughout the aorta and its mesenteric, renal, and iliac artery branches. 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 There is new hazy fat stranding about the distal infrarenal abdominal aorta at the level of the inferior mesenteric artery ( series 2 image 39 ). The inferior mesenteric artery is not well opacified and may be occluded. No evidence of intramural hematoma or dissection. LYMPH NODES: No lymphadenopathy. REPRODUCTIVE ORGANS: No acute abnormality. BONES AND SOFT TISSUES: No acute osseous abnormality. No focal soft tissue abnormality. IMPRESSION: 1. New hazy fat stranding about the distal infrarenal abdominal aorta at the level of the inferior mesenteric artery. This is favored to represent acute aortic injury in the setting of trauma. No intramural hematoma or dissection flap. Primary differential consideration is inflammatory or infectious aortitis. Vascular surgery consult is recommended. 2. Mixed density atherosclerotic plaque throughout the aorta and its mesenteric, renal, and iliac artery branches, with similar occlusion or near occlusion of the left common iliac artery. Electronically signed by: Norman Gatlin MD 11/20/2024 07:54 PM EST RP Workstation: [id number]   DG Wrist Complete Left Result Date: 11/13/2024 CLINICAL DATA:  Pain in left wrist. EXAM: LEFT WRIST - COMPLETE 3+ VIEW COMPARISON:  None Available. FINDINGS: No acute fracture or dislocation. There is no evidence of arthropathy or other focal bone abnormality. Soft tissues are unremarkable. IMPRESSION: No acute fracture or dislocation. Electronically Signed   By: Rogelia Myers M.D.   On: 11/13/2024 17:39   VAS US  LOWER EXTREMITY VENOUS REFLUX Result Date: 11/07/2024  Lower Venous Reflux Study Patient Name:  Todd Estrada  Date of Exam:   11/07/2024  Medical Rec #: [id number]      Accession #:    [id number] Date of Birth: [birth year]      Patient Gender: M Patient Age:   76 years Exam Location:  [address] Procedure:      VAS US  LOWER EXTREMITY VENOUS REFLUX Referring Phys: FONDA ROBINS --------------------------------------------------------------------------------  Indications: Edema.  Risk Factors: Immobility Cancer Lung DVT Hx. Comparison Study: None. Performing Technologist: Garnette Rockers  Examination Guidelines: A complete evaluation includes B-mode imaging, spectral Doppler, color Doppler, and power Doppler as needed of all accessible portions of each vessel. Bilateral testing is considered an integral part of a complete examination. Limited examinations for reoccurring indications may be performed as noted. The reflux portion of the exam is performed with the patient in reverse Trendelenburg. Significant venous reflux is defined as >500 ms in the superficial venous system, and >1 second in the deep venous system.  Venous Reflux Times +--------------+---------+------+-----------+------------+-------------+ RIGHT         Reflux NoRefluxReflux TimeDiameter cmsComments                              Yes                                       +--------------+---------+------+-----------+------------+-------------+ CFV  yes   >1 second                           +--------------+---------+------+-----------+------------+-------------+ FV mid                  yes   >1 second                           +--------------+---------+------+-----------+------------+-------------+ Popliteal     no                                                  +--------------+---------+------+-----------+------------+-------------+ GSV at SFJ              yes    >500 ms      0.93                  +--------------+---------+------+-----------+------------+-------------+ GSV prox thighno                            0.34                   +--------------+---------+------+-----------+------------+-------------+ GSV mid thigh           yes    >500 ms      0.2                   +--------------+---------+------+-----------+------------+-------------+ GSV dist thighno                            0.2                   +--------------+---------+------+-----------+------------+-------------+ GSV at knee   no                            0.18                  +--------------+---------+------+-----------+------------+-------------+ GSV prox calf                                       out of fascia +--------------+---------+------+-----------+------------+-------------+ GSV mid calf                                        out of fascia +--------------+---------+------+-----------+------------+-------------+ SSV at [facility]    no                            0.21                  +--------------+---------+------+-----------+------------+-------------+ SSV prox calf no                            0.18                  +--------------+---------+------+-----------+------------+-------------+   Summary: Right: - No evidence of deep vein thrombosis seen in the right lower extremity, from the common femoral through the popliteal veins. -  No evidence of superficial venous thrombosis in the right lower extremity. - Venous reflux is noted in the right common femoral vein. - Venous reflux is noted in the right sapheno-femoral junction. - Venous reflux is noted in the right greater saphenous vein in the thigh. - Venous reflux is noted in the right femoral vein.  *See table(s) above for measurements and observations. Electronically signed by Fonda Rim on 11/07/2024 at 1:42:58 PM.    Final    VAS US  ABI WITH/WO TBI Result Date: 11/07/2024  LOWER EXTREMITY DOPPLER STUDY Patient Name:  Todd Estrada  Date of Exam:   11/07/2024 Medical Rec #: [id number]      Accession #:    [id number] Date of Birth: March 03, 1961      Patient  Gender: M Patient Age:   64 years Exam Location:  [address] Procedure:      VAS US  ABI WITH/WO TBI Referring Phys: JOSHUA ROBINS --------------------------------------------------------------------------------  Indications: Peripheral artery disease. High Risk Factors: Hypertension, current smoker, prior CVA.  Comparison Study: 05/09/24 Performing Technologist: Garnette Rockers  Examination Guidelines: A complete evaluation includes at minimum, Doppler waveform signals and systolic blood pressure reading at the level of bilateral brachial, anterior tibial, and posterior tibial arteries, when vessel segments are accessible. Bilateral testing is considered an integral part of a complete examination. Photoelectric Plethysmograph (PPG) waveforms and toe systolic pressure readings are included as required and additional duplex testing as needed. Limited examinations for reoccurring indications may be performed as noted.  ABI Findings: +---------+------------------+-----+----------+--------+ Right    Rt Pressure (mmHg)IndexWaveform  Comment  +---------+------------------+-----+----------+--------+ Brachial 150                                       +---------+------------------+-----+----------+--------+ PTA      78                0.52 monophasic         +---------+------------------+-----+----------+--------+ DP       68                0.45 monophasic         +---------+------------------+-----+----------+--------+ Great Toe51                0.34 Normal             +---------+------------------+-----+----------+--------+ +---------+------------------+-----+----------+-------+ Left     Lt Pressure (mmHg)IndexWaveform  Comment +---------+------------------+-----+----------+-------+ Brachial 132                                      +---------+------------------+-----+----------+-------+ PTA      70                0.47 monophasic         +---------+------------------+-----+----------+-------+ DP       57                0.38 monophasic        +---------+------------------+-----+----------+-------+ Great Toe0                 0.00 Absent            +---------+------------------+-----+----------+-------+ +-------+-----------+-----------+------------+------------+ ABI/TBIToday's ABIToday's TBIPrevious ABIPrevious TBI +-------+-----------+-----------+------------+------------+ Right  0.52       0.34       0.58        0.44         +-------+-----------+-----------+------------+------------+  Left   0.47       0          0.45        0            +-------+-----------+-----------+------------+------------+  Left TBIs appear decreased compared to prior study on 05/09/24.  Summary: Right: Resting right ankle-brachial index indicates moderate right lower extremity arterial disease. The right toe-brachial index is abnormal.  Left: Resting left ankle-brachial index indicates severe left lower extremity arterial disease. The left toe-brachial index is abnormal.  *See table(s) above for measurements and observations.  Electronically signed by Fonda Rim on 11/07/2024 at 1:42:52 PM.    Final    CT Chest W Contrast Result Date: 10/28/2024 CLINICAL DATA:  Non-small cell lung cancer, staging. * Tracking Code: BO * EXAM: CT CHEST WITH CONTRAST TECHNIQUE: Multidetector CT imaging of the chest was performed during intravenous contrast administration. RADIATION DOSE REDUCTION: This exam was performed according to the departmental dose-optimization program which includes automated exposure control, adjustment of the mA and/or kV according to patient size and/or use of iterative reconstruction technique. CONTRAST:  75mL OMNIPAQUE  IOHEXOL  300 MG/ML  SOLN COMPARISON:  10/09/2024 FINDINGS: Cardiovascular: Atherosclerotic calcification of the aorta, aortic valve and coronary arteries. Heart size normal. No pericardial effusion. Mediastinum/Nodes:  No pathologically enlarged mediastinal or axillary lymph nodes. Hilar regions are difficult to definitively evaluate without IV contrast. Esophagus is grossly unremarkable. Lungs/Pleura: Centrilobular and paraseptal emphysema. Masslike consolidation in the medial lingula measures approximately 2.6 x 3.3 cm (5/71), similar to 10/09/2024. Surrounding architectural distortion and parenchymal retraction, indicative of radiation therapy. Additional parenchymal retraction and architectural distortion centered on a fiducial marker in the lateral right upper lobe, also indicative of radiation therapy. Calcified granulomas. Scarring in the lung bases. No pleural fluid. Minimal debris in the airway. Upper Abdomen: Visualized portions of the liver, adrenal glands, kidneys, spleen, pancreas, stomach and bowel are grossly unremarkable. No upper abdominal adenopathy. Musculoskeletal: Degenerative changes in the spine. Old lateral right rib fracture. No worrisome lytic or sclerotic lesions. IMPRESSION: 1. Medial left upper lobe mass is stable from 10/09/2024. Surrounding changes of radiation therapy. No evidence of disease progression. 2. Additional changes of radiation therapy in the lateral right upper lobe. 3. Aortic atherosclerosis (ICD10-I70.0). Coronary artery calcification. 4.  Emphysema (ICD10-J43.9). Electronically Signed   By: Newell Eke M.D.   On: 10/28/2024 16:23       The results of significant diagnostics from this hospitalization (including imaging, microbiology, ancillary and laboratory) are listed below for reference.     Microbiology: No results found for this or any previous visit (from the past 240 hours).   Labs:  CBC: Recent Labs  Lab 11/20/24 1716 11/22/24 0258 11/23/24 0330 11/24/24 0854  WBC 7.3 4.6 6.0 7.3  NEUTROABS 5.3  --   --   --   HGB 13.3 12.0* 12.6* 13.6  HCT 41.0 36.5* 38.0* 41.5  MCV 92.3 90.8 90.3 90.0  PLT 148* 142* 144* 144*   BMP &GFR Recent Labs  Lab  11/20/24 1716 11/22/24 0258 11/23/24 0330 11/24/24 0854  NA 138 138 136 137  K 4.2 3.8 4.1 4.5  CL 101 100 99 99  CO2 25 27 29 28   GLUCOSE 104* 125* 114* 119*  BUN 18 14 15 19   CREATININE 0.89 1.12 1.25* 1.02  CALCIUM  9.8 8.6* 8.5* 8.9  MG  --   --  1.9 2.4  PHOS  --   --  4.6 4.3   Estimated Creatinine  Clearance: 82.7 mL/min (by C-G formula based on SCr of 1.02 mg/dL). Liver & Pancreas: Recent Labs  Lab 11/20/24 1716 11/24/24 0854  AST 19  --   ALT 20  --   ALKPHOS 99  --   BILITOT 0.7  --   PROT 7.6  --   ALBUMIN  4.2 3.4*   Recent Labs  Lab 11/20/24 1716  LIPASE 17   No results for input(s): AMMONIA in the last 168 hours. Diabetic: No results for input(s): HGBA1C in the last 72 hours. No results for input(s): GLUCAP in the last 168 hours. Cardiac Enzymes: No results for input(s): CKTOTAL, CKMB, CKMBINDEX, TROPONINI in the last 168 hours. No results for input(s): PROBNP in the last 8760 hours. Coagulation Profile: No results for input(s): INR, PROTIME in the last 168 hours. Thyroid Function Tests: No results for input(s): TSH, T4TOTAL, FREET4, T3FREE, THYROIDAB in the last 72 hours. Lipid Profile: No results for input(s): CHOL, HDL, LDLCALC, TRIG, CHOLHDL, LDLDIRECT in the last 72 hours. Anemia Panel: No results for input(s): VITAMINB12, FOLATE, FERRITIN, TIBC, IRON, RETICCTPCT in the last 72 hours. Urine analysis:    Component Value Date/Time   COLORURINE YELLOW 10/17/2024 0102   APPEARANCEUR CLEAR 10/17/2024 0102   LABSPEC 1.036 (H) 10/17/2024 0102   PHURINE 6.0 10/17/2024 0102   GLUCOSEU NEGATIVE 10/17/2024 0102   HGBUR NEGATIVE 10/17/2024 0102   BILIRUBINUR NEGATIVE 10/17/2024 0102   KETONESUR NEGATIVE 10/17/2024 0102   PROTEINUR NEGATIVE 10/17/2024 0102   NITRITE NEGATIVE 10/17/2024 0102   LEUKOCYTESUR NEGATIVE 10/17/2024 0102   Sepsis Labs: Invalid input(s): PROCALCITONIN,  LACTICIDVEN   SIGNED:  Chyrel Taha T Daemian Gahm, MD  [facility] 11/24/2024, 11:35 AM

## 2024-11-25 ENCOUNTER — Telehealth: Payer: Self-pay

## 2024-11-25 NOTE — Transitions of Care (Post Inpatient/ED Visit) (Unsigned)
   11/25/2024  Name: Todd Estrada MRN: [id number] DOB: 11/28/1961  Today's TOC FU Call Status: Today's TOC FU Call Status:: Unsuccessful Call (1st Attempt) Unsuccessful Call (1st Attempt) Date: 11/25/24  Attempted to reach the patient regarding the most recent Inpatient/ED visit.  Follow Up Plan: Additional outreach attempts will be made to reach the patient to complete the Transitions of Care (Post Inpatient/ED visit) call.   Signature Julian Lemmings, LPN [facility] Nurse Health Advisor Direct Dial [phone]

## 2024-11-26 NOTE — Transitions of Care (Post Inpatient/ED Visit) (Signed)
 11/26/2024  Name: Todd Estrada MRN: [id number] DOB: 1961/06/16  Today's TOC FU Call Status: Today's TOC FU Call Status:: Successful TOC FU Call Completed Unsuccessful Call (1st Attempt) Date: 11/25/24 [facility] FU Call Complete Date: 11/26/24  Patient's Name and Date of Birth confirmed. Name, DOB  Transition Care Management Follow-up Telephone Call Date of Discharge: 11/24/24 Discharge Facility: [facility]) Type of Discharge: Inpatient Admission Primary Inpatient Discharge Diagnosis:: fall How have you been since you were released from the hospital?: Better Any questions or concerns?: No  Items Reviewed: Did you receive and understand the discharge instructions provided?: Yes Medications obtained,verified, and reconciled?: Yes (Medications Reviewed) Any new allergies since your discharge?: No Dietary orders reviewed?: Yes Do you have support at home?: Yes Name of Support/Comfort Primary Source: caregiver  Medications Reviewed Today: Medications Reviewed Today     Reviewed by Emmitt Pan, LPN (Licensed Practical Nurse) on 11/26/24 at 1129  Med List Status: <None>   Medication Order Taking? Sig Documenting Provider Last Dose Status Informant  albuterol  (PROVENTIL ) (2.5 MG/3ML) 0.083% nebulizer solution [id number] Yes Take 3 mLs (2.5 mg total) by nebulization every 4 (four) hours as needed for wheezing or shortness of breath. Lou Claretta HERO, MD  Active Family Member  atorvastatin  (LIPITOR) 40 MG tablet [id number] Yes Take 1 tablet (40 mg total) by mouth daily. [name], [name], DO  Active   diclofenac  Sodium (VOLTAREN ) 1 % GEL [id number] Yes Apply 2 g topically 4 (four) times daily. Nguyen, Diana, MD  Active   enoxaparin  (LOVENOX ) 100 MG/ML injection [id number] Yes Inject 0.85 mLs (85 mg total) into the skin every 12 (twelve) hours. Leontine Lapine, MD  Active   lidocaine  (LIDODERM ) 5 % [id number] Yes Place 1 patch onto the skin daily. Remove & Discard patch within 12 hours or  as directed by MD Leontine Lapine, MD  Active   Multiple Vitamin (MULTIVITAMIN WITH MINERALS) TABS tablet [id number] Yes Take 1 tablet by mouth daily. [provider]  Active Family Member  ondansetron  (ZOFRAN -ODT) 4 MG disintegrating tablet [id number] Yes Take 1 tablet (4 mg total) by mouth every 8 (eight) hours as needed for nausea or vomiting. Minnie Tinnie BRAVO, PA  Active   pantoprazole  (PROTONIX ) 40 MG tablet [id number] Yes TAKE 1 TABLET(40 MG) BY MOUTH DAILY Tawkaliyar, Roya, DO  Active   polyethylene glycol powder (MIRALAX ) 17 GM/SCOOP powder [id number] Yes Take 17 g by mouth 2 (two) times daily as needed for mild constipation. Gonfa, Taye T, MD  Active   senna-docusate (SENOKOT-S) 8.6-50 MG tablet [id number] Yes Take 1 tablet by mouth 2 (two) times daily between meals as needed for mild constipation. Gonfa, Taye T, MD  Active   sucralfate  (CARAFATE ) 1 g tablet [id number] Yes TAKE 1 TABLET(1 GRAM) BY MOUTH FOUR TIMES DAILY( WITH MEALS AND AT BEDTIME) Tobie Gaines, DO  Active   Tiotropium Bromide-Olodaterol (STIOLTO RESPIMAT ) 2.5-2.5 MCG/ACT AERS [id number] Yes NEW PRESCRIPTION REQUEST: STIOLTO 2.5 MCG- INHALE TWO PUFFS BY MOUTH DAILY [name], [name], DO  Active   Med List Note [name] 08/19/23 2230): Pt  has difficulty speaking. Pt cousin [name] at [phone] [facility]) helps with medications.             Home Care and Equipment/Supplies: Were Home Health Services Ordered?: NA Any new equipment or medical supplies ordered?: NA  Functional Questionnaire: Do you need assistance with bathing/showering or dressing?: Yes Do you need assistance with meal preparation?: Yes Do you need assistance with eating?: No Do you  have difficulty maintaining continence: No Do you need assistance with getting out of bed/getting out of a chair/moving?: No Do you have difficulty managing or taking your medications?: Yes  Follow up appointments reviewed: PCP Follow-up  appointment confirmed?: No (sent message to staff to schedule) MD Provider Line Number:[phone] Given: No Specialist Hospital Follow-up appointment confirmed?: NA Do you need transportation to your follow-up appointment?: No Do you understand care options if your condition(s) worsen?: Yes-patient verbalized understanding    SIGNATURE Julian Lemmings, LPN [facility] Nurse Health Advisor Direct Dial [phone]

## 2024-11-27 NOTE — Telephone Encounter (Signed)
 Pt appt 12/04/2024 @ 10:15 with Dr. Jolaine.

## 2024-11-29 ENCOUNTER — Emergency Department (HOSPITAL_COMMUNITY)
Admission: EM | Admit: 2024-11-29 | Discharge: 2024-11-30 | Disposition: A | Attending: Emergency Medicine | Admitting: Emergency Medicine

## 2024-11-29 ENCOUNTER — Emergency Department (HOSPITAL_COMMUNITY)

## 2024-11-29 DIAGNOSIS — R0789 Other chest pain: Secondary | ICD-10-CM | POA: Insufficient documentation

## 2024-11-29 DIAGNOSIS — R079 Chest pain, unspecified: Secondary | ICD-10-CM

## 2024-11-29 DIAGNOSIS — R040 Epistaxis: Secondary | ICD-10-CM | POA: Insufficient documentation

## 2024-11-29 LAB — TROPONIN T, HIGH SENSITIVITY
Troponin T High Sensitivity: <15 ng/L (ref 0–19)
Troponin T High Sensitivity: <15 ng/L (ref 0–19)

## 2024-11-29 LAB — BASIC METABOLIC PANEL WITH GFR
Anion gap: 9 (ref 5–15)
BUN: 20 mg/dL (ref 8–23)
CO2: 29 mmol/L (ref 22–32)
Calcium: 9.6 mg/dL (ref 8.9–10.3)
Chloride: 101 mmol/L (ref 98–111)
Creatinine, Ser: 0.93 mg/dL (ref 0.61–1.24)
GFR, Estimated: >60 mL/min (ref >60)
Glucose, Bld: 99 mg/dL (ref 70–99)
Potassium: 4.6 mmol/L (ref 3.5–5.1)
Sodium: 139 mmol/L (ref 135–145)

## 2024-11-29 LAB — CBC
HCT: 43.2 % (ref 39.0–52.0)
Hemoglobin: 13.9 g/dL (ref 13.0–17.0)
MCH: 29.8 pg (ref 26.0–34.0)
MCHC: 32.2 g/dL (ref 30.0–36.0)
MCV: 92.7 fL (ref 80.0–100.0)
Platelets: 198 K/uL (ref 150–400)
RBC: 4.66 MIL/uL (ref 4.22–5.81)
RDW: 13 % (ref 11.5–15.5)
WBC: 5 K/uL (ref 4.0–10.5)
nRBC: 0 % (ref 0.0–0.2)

## 2024-11-29 NOTE — ED Provider Notes (Signed)
 [place] EMERGENCY DEPARTMENT AT [facility] Provider Note   CSN: [id number] Arrival date & time: 11/29/24  1717     Patient presents with: Chest Pain and Epistaxis   Todd Estrada is a 62 y.o. male.   The history is provided by the patient and medical records.  Chest Pain Epistaxis  63 y.o. M with hx of emphysema, squamous cell lung cancer s/p treatment, hx of PE on lovenox , PAD, PAF, presenting to the ED for chest pain.  States ongoing for a few days now.  Denies cough, fever, or SOB.  Also reports some nosebleeds.  Have been intermittent since leaving the hospital.  He is currently on lovenox  injections.  Prior to Admission medications  Medication Sig Start Date End Date Taking? Authorizing Provider  albuterol  (PROVENTIL ) (2.5 MG/3ML) 0.083% nebulizer solution Take 3 mLs (2.5 mg total) by nebulization every 4 (four) hours as needed for wheezing or shortness of breath. 08/16/22   Lou Claretta HERO, MD  atorvastatin  (LIPITOR) 40 MG tablet Take 1 tablet (40 mg total) by mouth daily. 08/14/24 08/14/25  [name], [name], DO  diclofenac  Sodium (VOLTAREN ) 1 % GEL Apply 2 g topically 4 (four) times daily. 11/18/24   Nguyen, Diana, MD  enoxaparin  (LOVENOX ) 100 MG/ML injection Inject 0.85 mLs (85 mg total) into the skin every 12 (twelve) hours. 11/18/24   Nguyen, Diana, MD  lidocaine  (LIDODERM ) 5 % Place 1 patch onto the skin daily. Remove & Discard patch within 12 hours or as directed by MD 11/18/24   Leontine Lapine, MD  Multiple Vitamin (MULTIVITAMIN WITH MINERALS) TABS tablet Take 1 tablet by mouth daily.    [provider]  ondansetron  (ZOFRAN -ODT) 4 MG disintegrating tablet Take 1 tablet (4 mg total) by mouth every 8 (eight) hours as needed for nausea or vomiting. 10/17/24   Minnie Tinnie BRAVO, PA  pantoprazole  (PROTONIX ) 40 MG tablet TAKE 1 TABLET(40 MG) BY MOUTH DAILY 07/08/24   Tawkaliyar, Roya, DO  polyethylene glycol powder (MIRALAX ) 17 GM/SCOOP powder Take 17 g by mouth  2 (two) times daily as needed for mild constipation. 11/23/24   Gonfa, Taye T, MD  senna-docusate (SENOKOT-S) 8.6-50 MG tablet Take 1 tablet by mouth 2 (two) times daily between meals as needed for mild constipation. 11/23/24   Gonfa, Taye T, MD  sucralfate  (CARAFATE ) 1 g tablet TAKE 1 TABLET(1 GRAM) BY MOUTH FOUR TIMES DAILY( WITH MEALS AND AT BEDTIME) 09/19/24   Tobie Gaines, DO  Tiotropium Bromide-Olodaterol (STIOLTO RESPIMAT ) 2.5-2.5 MCG/ACT AERS NEW PRESCRIPTION REQUEST: STIOLTO 2.5 MCG- INHALE TWO PUFFS BY MOUTH DAILY 08/14/24   [name], [name], DO    Allergies: Patient has no known allergies.    Review of Systems  HENT:  Positive for nosebleeds.   Cardiovascular:  Positive for chest pain.  All other systems reviewed and are negative.   Updated Vital Signs BP 133/81   Pulse 73   Temp 98.5 F (36.9 C)   Resp 14   SpO2 100%   Physical Exam Vitals and nursing note reviewed.  Constitutional:      Appearance: He is well-developed.  HENT:     Head: Normocephalic and atraumatic.     Nose:     Comments: Dried blood in left nostril along the septal wall, no active epistaxis, no septal hematoma or deformity present Eyes:     Conjunctiva/sclera: Conjunctivae normal.     Pupils: Pupils are equal, round, and reactive to light.  Cardiovascular:     Rate and Rhythm:  Normal rate and regular rhythm.     Heart sounds: Normal heart sounds.  Pulmonary:     Effort: Pulmonary effort is normal.     Breath sounds: Normal breath sounds.  Abdominal:     General: Bowel sounds are normal.     Palpations: Abdomen is soft.  Musculoskeletal:        General: Normal range of motion.     Cervical back: Normal range of motion.  Skin:    General: Skin is warm and dry.  Neurological:     Mental Status: He is alert.     Comments: Awake, alert, expressive aphasia at baseline, moving extremities without focal deficit     (all labs ordered are listed, but only abnormal results are displayed) Labs  Reviewed  BASIC METABOLIC PANEL WITH GFR  CBC  TROPONIN T, HIGH SENSITIVITY  TROPONIN T, HIGH SENSITIVITY    EKG: None  Radiology: DG Chest 2 View Result Date: 11/29/2024 EXAM: 2 VIEW(S) XRAY OF THE CHEST 11/29/2024 06:22:00 PM COMPARISON: 10/09/2024 CLINICAL HISTORY: chest pain FINDINGS: LUNGS AND PLEURA: Stable left perihilar parenchymal opacity. Stable right mid lung peripheral nodule with fiducial marker. Stable calcified granulomas in the right upper lobe. No new focal opacity. No pleural effusion. No pneumothorax. HEART AND MEDIASTINUM: Calcified aorta. Prominent. BONES AND SOFT TISSUES: Thoracic degenerative changes. No acute osseous abnormality. IMPRESSION: 1. No acute cardiopulmonary process. 2. Stable left perihilar parenchymal opacity. 3. Stable right mid lung peripheral nodule with fiducial marker. 4. Stable calcified granulomas in the right upper lobe. Electronically signed by: Greig Pique MD 11/29/2024 07:16 PM EST RP Workstation: [id number]     Procedures   Medications Ordered in the ED  oxymetazoline  (AFRIN) 0.05 % nasal spray 1 spray (1 spray Each Nare Given 11/30/24 0015)                                    Medical Decision Making Amount and/or Complexity of Data Reviewed Labs: ordered. Radiology: ordered and independent interpretation performed. ECG/medicine tests: ordered and independent interpretation performed.  Risk OTC drugs.   63 y.o. M here with chest pain and nosebleeds.  Recent hospitalization for suspected aortic injury but CTA was negative aside from chronic thrombus.  Currently on lovenox .  Afebrile, non-toxic in appearance.  Aphasic at baseline, this appears unchanged.  EKG is non-ischemic.  Labs reassuring without leukocytosis or electrolyte derangement.  Troponin negative x 2.  Chest x-ray is clear.  In regards to nosebleeds, has dried blood in left nostril along the septal wall.  No deformity or hematoma.  No active epistaxis.  Likely from  his lovenox .  Given afrin to use PRN, cautioned against using more than 3 consecutive days.  Encouraged to follow-up with PCP.  Return here for new concerns.  Final diagnoses:  Chest pain in adult  Nosebleed    ED Discharge Orders     None          Jarold Olam CHRISTELLA DEVONNA 11/30/24 0038    Dasie Faden, MD 12/01/24 1739

## 2024-11-29 NOTE — ED Triage Notes (Signed)
 Patient in today reporting ongoing nosebleeds with chest pain that started today. Bleeding controlled. Reports pressure on chest. Recent discharge from hospital.

## 2024-11-30 MED ORDER — OXYMETAZOLINE HCL 0.05 % NA SOLN
1.0000 | Freq: Once | NASAL | Status: AC
  Administered 2024-11-30: 1 via NASAL
  Filled 2024-11-30: qty 30

## 2024-11-30 NOTE — Discharge Instructions (Signed)
 Cardiac work-up today was reassuring. Can use afrin if nosebleed recurs.  Do not use for more than 3 days in a row. Follow-up with your primary care doctor. Return here for new concerns.

## 2024-12-02 ENCOUNTER — Emergency Department (HOSPITAL_COMMUNITY)
Admission: EM | Admit: 2024-12-02 | Discharge: 2024-12-02 | Disposition: A | Discharge status: Home / Self Care | Attending: Emergency Medicine | Admitting: Emergency Medicine

## 2024-12-02 ENCOUNTER — Encounter (HOSPITAL_COMMUNITY): Payer: Self-pay

## 2024-12-02 ENCOUNTER — Other Ambulatory Visit: Payer: Self-pay

## 2024-12-02 DIAGNOSIS — R4701 Aphasia: Secondary | ICD-10-CM | POA: Diagnosis not present

## 2024-12-02 DIAGNOSIS — I509 Heart failure, unspecified: Secondary | ICD-10-CM | POA: Diagnosis not present

## 2024-12-02 DIAGNOSIS — Z85118 Personal history of other malignant neoplasm of bronchus and lung: Secondary | ICD-10-CM | POA: Diagnosis not present

## 2024-12-02 DIAGNOSIS — Z8673 Personal history of transient ischemic attack (TIA), and cerebral infarction without residual deficits: Secondary | ICD-10-CM | POA: Diagnosis not present

## 2024-12-02 DIAGNOSIS — R079 Chest pain, unspecified: Secondary | ICD-10-CM

## 2024-12-02 DIAGNOSIS — R0789 Other chest pain: Secondary | ICD-10-CM | POA: Diagnosis present

## 2024-12-02 DIAGNOSIS — R1084 Generalized abdominal pain: Secondary | ICD-10-CM | POA: Diagnosis not present

## 2024-12-02 LAB — CBC
HCT: 42.4 % (ref 39.0–52.0)
Hemoglobin: 13.3 g/dL (ref 13.0–17.0)
MCH: 29 pg (ref 26.0–34.0)
MCHC: 31.4 g/dL (ref 30.0–36.0)
MCV: 92.6 fL (ref 80.0–100.0)
Platelets: 215 K/uL (ref 150–400)
RBC: 4.58 MIL/uL (ref 4.22–5.81)
RDW: 12.9 % (ref 11.5–15.5)
WBC: 4.6 K/uL (ref 4.0–10.5)
nRBC: 0 % (ref 0.0–0.2)

## 2024-12-02 LAB — COMPREHENSIVE METABOLIC PANEL WITH GFR
ALT: 21 U/L (ref 0–44)
AST: 25 U/L (ref 15–41)
Albumin: 4.2 g/dL (ref 3.5–5.0)
Alkaline Phosphatase: 107 U/L (ref 38–126)
Anion gap: 10 (ref 5–15)
BUN: 14 mg/dL (ref 8–23)
CO2: 26 mmol/L (ref 22–32)
Calcium: 9.8 mg/dL (ref 8.9–10.3)
Chloride: 103 mmol/L (ref 98–111)
Creatinine, Ser: 0.88 mg/dL (ref 0.61–1.24)
GFR, Estimated: >60 mL/min (ref >60)
Glucose, Bld: 105 mg/dL — ABNORMAL HIGH (ref 70–99)
Potassium: 4.7 mmol/L (ref 3.5–5.1)
Sodium: 140 mmol/L (ref 135–145)
Total Bilirubin: 0.3 mg/dL (ref 0.0–1.2)
Total Protein: 7.7 g/dL (ref 6.5–8.1)

## 2024-12-02 LAB — TROPONIN T, HIGH SENSITIVITY: Troponin T High Sensitivity: <15 ng/L (ref 0–19)

## 2024-12-02 LAB — LIPASE, BLOOD: Lipase: 18 U/L (ref 11–51)

## 2024-12-02 NOTE — Discharge Instructions (Signed)
 Follow up with your Physician for recheck

## 2024-12-02 NOTE — ED Provider Notes (Signed)
 [place] EMERGENCY DEPARTMENT AT [facility] Provider Note   CSN: [id number] Arrival date & time: 12/02/24  1316     Patient presents with: Abdominal Pain   Todd Estrada is a 63 y.o. male.   Patient is brought to the emergency department by his cousin who reports that patient was having chest pain earlier today.  Patient has a past medical history of heart failure and atrial fibrillation so they wanted to make sure that patient got evaluated.  Patient has aphasia he is able to answer questions with yes and no.  Patient reports he has discomfort in his chest and some soreness in his upper abdomen patient denies any fever he denies any chills he has not had any nausea or vomiting.  Patient states he is not currently short of breath.  He has had similar pain to this in the past.  Patient has a past medical history of congestive heart failure emphysema lung cancer, CVA with expressive aphasia.  The history is provided by a relative.  Abdominal Pain Associated symptoms: chest pain        Prior to Admission medications  Medication Sig Start Date End Date Taking? Authorizing Provider  albuterol  (PROVENTIL ) (2.5 MG/3ML) 0.083% nebulizer solution Take 3 mLs (2.5 mg total) by nebulization every 4 (four) hours as needed for wheezing or shortness of breath. 08/16/22   Lou Claretta HERO, MD  atorvastatin  (LIPITOR) 40 MG tablet Take 1 tablet (40 mg total) by mouth daily. 08/14/24 08/14/25  [name], [name], DO  diclofenac  Sodium (VOLTAREN ) 1 % GEL Apply 2 g topically 4 (four) times daily. 11/18/24   Nguyen, Diana, MD  enoxaparin  (LOVENOX ) 100 MG/ML injection Inject 0.85 mLs (85 mg total) into the skin every 12 (twelve) hours. 11/18/24   Leontine Lapine, MD  lidocaine  (LIDODERM ) 5 % Place 1 patch onto the skin daily. Remove & Discard patch within 12 hours or as directed by MD 11/18/24   Leontine Lapine, MD  Multiple Vitamin (MULTIVITAMIN WITH MINERALS) TABS tablet Take 1 tablet by mouth daily.     [provider]  ondansetron  (ZOFRAN -ODT) 4 MG disintegrating tablet Take 1 tablet (4 mg total) by mouth every 8 (eight) hours as needed for nausea or vomiting. 10/17/24   Minnie Tinnie BRAVO, PA  pantoprazole  (PROTONIX ) 40 MG tablet TAKE 1 TABLET(40 MG) BY MOUTH DAILY 07/08/24   Tawkaliyar, Roya, DO  polyethylene glycol powder (MIRALAX ) 17 GM/SCOOP powder Take 17 g by mouth 2 (two) times daily as needed for mild constipation. 11/23/24   Gonfa, Taye T, MD  senna-docusate (SENOKOT-S) 8.6-50 MG tablet Take 1 tablet by mouth 2 (two) times daily between meals as needed for mild constipation. 11/23/24   Gonfa, Taye T, MD  sucralfate  (CARAFATE ) 1 g tablet TAKE 1 TABLET(1 GRAM) BY MOUTH FOUR TIMES DAILY( WITH MEALS AND AT BEDTIME) 09/19/24   Tobie Gaines, DO  Tiotropium Bromide-Olodaterol (STIOLTO RESPIMAT ) 2.5-2.5 MCG/ACT AERS NEW PRESCRIPTION REQUEST: STIOLTO 2.5 MCG- INHALE TWO PUFFS BY MOUTH DAILY 08/14/24   [name], [name], DO    Allergies: Patient has no known allergies.    Review of Systems  Cardiovascular:  Positive for chest pain.  Gastrointestinal:  Positive for abdominal pain.  All other systems reviewed and are negative.   Updated Vital Signs BP (!) 122/99   Pulse (!) 106   Temp 98 F (36.7 C) (Oral)   Resp 16   Ht 6' 1 (1.854 m)   Wt 88 kg   SpO2 100%  BMI 25.60 kg/m   Physical Exam Vitals and nursing note reviewed.  Constitutional:      Appearance: He is well-developed.  HENT:     Head: Normocephalic.  Cardiovascular:     Rate and Rhythm: Normal rate and regular rhythm.  Pulmonary:     Effort: Pulmonary effort is normal.  Abdominal:     General: Bowel sounds are normal. There is no distension.     Palpations: Abdomen is soft.     Tenderness: There is no abdominal tenderness.  Musculoskeletal:        General: Normal range of motion.     Cervical back: Normal range of motion.  Skin:    General: Skin is warm.  Neurological:     General: No focal deficit present.      Mental Status: He is alert and oriented to person, place, and time.     (all labs ordered are listed, but only abnormal results are displayed) Labs Reviewed  COMPREHENSIVE METABOLIC PANEL WITH GFR - Abnormal; Notable for the following components:      Result Value   Glucose, Bld 105 (*)    All other components within normal limits  LIPASE, BLOOD  CBC  URINALYSIS, ROUTINE W REFLEX MICROSCOPIC  TROPONIN T, HIGH SENSITIVITY  TROPONIN T, HIGH SENSITIVITY    EKG: None  Radiology: No results found.   Procedures   Medications Ordered in the ED - No data to display                                  Medical Decision Making Patient complains of discomfort in his chest.  Patient also had some abdominal pain over the last 5 days.  Patient has had multiple evaluations in the emergency department for the same.  I spoke with his cousin who is his care provider who reports he wanted to make sure patient was not having any heart problems.  Amount and/or Complexity of Data Reviewed Independent Historian: guardian External Data Reviewed: notes.    Details: Previous ED notes reviewed Labs: ordered.    Details: Labs ordered reviewed and interpreted troponin is negative ECG/medicine tests: ordered and independent interpretation performed. Decision-making details documented in ED Course.    Details: EKG sinus tachycardia, no acute findings.        Final diagnoses:  Chest pain, unspecified type    ED Discharge Orders     None       An After Visit Summary was printed and given to the patient.    Hawken Bielby K, PA-C 12/02/24 2158    Randol Simmonds, MD 12/04/24 [phone]

## 2024-12-02 NOTE — ED Notes (Signed)
PT watching TV at this time.

## 2024-12-02 NOTE — ED Provider Triage Note (Signed)
 Emergency Medicine Provider Triage Evaluation Note  Todd Estrada , a 63 y.o. male  was evaluated in triage.  Pt complains of chest pain.  Pt has expressive aphasia. Pt points to his chest.   Review of Systems  Positive: unable Negative: unable  Physical Exam  BP (!) 115/91   Pulse 87   Temp 98 F (36.7 C) (Oral)   Resp 16   Ht 6' 1 (1.854 m)   Wt 88 kg   SpO2 98%   BMI 25.60 kg/m  Gen:   Awake, no distress   Resp:  Normal effort  MSK:   Moves extremities without difficulty  Other:    Medical Decision Making  Medically screening exam initiated at 2:30 PM.  Appropriate orders placed.  Todd Estrada was informed that the remainder of the evaluation will be completed by another provider, this initial triage assessment does not replace that evaluation, and the importance of remaining in the ED until their evaluation is complete.     Todd Sonny POUR, PA-C 12/02/24 1432

## 2024-12-02 NOTE — ED Triage Notes (Signed)
 Generalized abdominal pain  for 5 days. Denies N/V/D.

## 2024-12-04 ENCOUNTER — Ambulatory Visit: Admitting: Student

## 2024-12-04 VITALS — BP 113/74 | HR 83 | Temp 98.7°F | Ht 73.0 in | Wt 196.0 lb

## 2024-12-04 DIAGNOSIS — R1032 Left lower quadrant pain: Secondary | ICD-10-CM | POA: Diagnosis not present

## 2024-12-04 DIAGNOSIS — Z8673 Personal history of transient ischemic attack (TIA), and cerebral infarction without residual deficits: Secondary | ICD-10-CM | POA: Diagnosis not present

## 2024-12-04 DIAGNOSIS — I739 Peripheral vascular disease, unspecified: Secondary | ICD-10-CM

## 2024-12-04 DIAGNOSIS — I69391 Dysphagia following cerebral infarction: Secondary | ICD-10-CM

## 2024-12-04 DIAGNOSIS — Z1211 Encounter for screening for malignant neoplasm of colon: Secondary | ICD-10-CM

## 2024-12-04 DIAGNOSIS — I745 Embolism and thrombosis of iliac artery: Secondary | ICD-10-CM | POA: Diagnosis not present

## 2024-12-04 DIAGNOSIS — Z86711 Personal history of pulmonary embolism: Secondary | ICD-10-CM | POA: Diagnosis not present

## 2024-12-04 DIAGNOSIS — M25511 Pain in right shoulder: Secondary | ICD-10-CM

## 2024-12-04 DIAGNOSIS — R82998 Other abnormal findings in urine: Secondary | ICD-10-CM | POA: Diagnosis present

## 2024-12-04 DIAGNOSIS — Z1159 Encounter for screening for other viral diseases: Secondary | ICD-10-CM

## 2024-12-04 NOTE — Assessment & Plan Note (Signed)
 As above patient was recently hospitalized with some CT concern of aortic injury that was further evaluated the with CT angiography which found no significant changes in his chronic left iliac artery occlusion.  Vascular surgery plans to follow-up with the patient in about 1 year.  He is no longer on an antiplatelet, initially due to dysphagia but now due to being anticoagulated.  If there is no blood in his urine and no further issues with bleeding we may consider resuming antiplatelet therapy as he can crush the aspirin . - Continue Lovenox  for anticoagulation

## 2024-12-04 NOTE — Assessment & Plan Note (Signed)
 Patient has had recurrent problems with right shoulder pain that mainly occurs due to chronic right upper extremity weakness and malpositioning of his right arm.  This has been evaluated for cardiac chest pain and 2 separate ED visits within the past 2 weeks without signs of any cardiac chest pain.  The pain is made better by NSAIDs and Tylenol  but he is counseled not to use NSAIDs while being anticoagulated.  He can also use a shoulder sling especially while he sleeps to prevent malpositioning of his arm. - Tylenol  1000 mg every 8 hours as needed

## 2024-12-04 NOTE — Patient Instructions (Signed)
 Thank you, [name], for allowing us  to provide your care today. Today we discussed . . .  > Shoulder pain       - You can take Tylenol  1000 mg every 8 hours as needed for your shoulder pain and ankle pains.  Please avoid taking medicines such as Advil , ibuprofen , Aleve , naproxen , Voltaren , diclofenac , or higher doses of aspirin  as these can worsen your bleeding risk.  You can also use a shoulder sling to keep your arm from getting in an uncomfortable position. > Orange urine       - Please come back to the clinic in the next week to have your urine collected so we can see if there is any blood or other abnormalities.  I have ordered the following labs for you:   Lab Orders         Hepatitis C Ab reflex to Quant PCR         Urinalysis, Reflex Microscopic       Referrals ordered today:    Referral Orders         Ambulatory referral to Gastroenterology       Follow up: 3 months    Remember:  Should you have any questions or concerns please call the internal medicine clinic at [phone].     Fairy Pool, DO [facility]

## 2024-12-04 NOTE — Progress Notes (Signed)
 CC: Hospital Follow Up after admission from 11/20/2024 to 11/24/2024 for concerns for aortic injury  HPI:  Todd Estrada is a 63 y.o. 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 Please see assessment and plan below for further details.  Follow up Hospitalization  Patient was admitted to [facility] on 11/20/2024 and discharged on 11/24/2024. He was treated for PAD with chronic iliac artery occlusion and history of pulmonary embolism. Treatment for this included anticoagulation. Telephone follow up was done on 11/26/2024 He reports good compliance with treatment. He reports this condition is stayed the same.  ----------------------------------------------------------------------------------------- -   Medications: Current Outpatient Medications  Medication Instructions   albuterol  (PROVENTIL ) 2.5 mg, Nebulization, Every 4 hours PRN   atorvastatin  (LIPITOR) 40 mg, Oral, Daily   enoxaparin  (LOVENOX ) 1 mg/kg, Subcutaneous, Every 12 hours   Multiple Vitamin (MULTIVITAMIN WITH MINERALS) TABS tablet 1 tablet, Daily   ondansetron  (ZOFRAN -ODT) 4 mg, Oral, Every 8 hours PRN   polyethylene glycol powder (MIRALAX ) 17 g, Oral, 2 times daily PRN   senna-docusate (SENOKOT-S) 8.6-50 MG tablet 1 tablet, Oral, 2 times daily between meals PRN   sucralfate  (CARAFATE ) 1 g tablet TAKE 1 TABLET(1 GRAM) BY MOUTH FOUR TIMES DAILY( WITH MEALS AND AT BEDTIME)   Tiotropium Bromide-Olodaterol (STIOLTO RESPIMAT ) 2.5-2.5 MCG/ACT AERS NEW PRESCRIPTION REQUEST: STIOLTO 2.5 MCG- INHALE TWO PUFFS BY MOUTH DAILY     Review of Systems:   Pertinent items noted in HPI and/or A&P.  Physical Exam:  Vitals:   12/04/24 1008  BP: 113/74  Pulse: 83  Temp: 98.7 F (37.1 C)  TempSrc: Oral  SpO2: 97%  Weight: 196 lb (88.9 kg)   Height: 6' 1 (1.854 m)    Constitutional: Chronically ill-appearing elderly male in a wheelchair. In no acute distress. HEENT: Normocephalic, atraumatic, Sclera non-icteric, PERRL, EOM intact Cardio:Regular rate and rhythm. 2+ bilateral radial pulses. Pulm:Clear to auscultation bilaterally. Normal work of breathing on room air. MSK: Trace pitting edema to lower extremities bilaterally. Skin:Warm and dry. Neuro:Alert.  Stable right upper extremity weakness and expressive aphasia   Assessment & Plan:   Assessment & Plan Right shoulder pain, unspecified chronicity Hx of ischemic left MCA stroke Patient has had recurrent problems with right shoulder pain that mainly occurs due to chronic right upper extremity weakness and malpositioning of his right arm.  This has been evaluated for cardiac chest pain and 2 separate ED visits within the past 2 weeks without signs of any cardiac chest pain.  The pain is made better by NSAIDs and Tylenol  but he is counseled not to use NSAIDs while being anticoagulated.  He can also use a shoulder sling especially while he sleeps to prevent malpositioning of his arm. - Tylenol  1000 mg every 8 hours as needed PAD (peripheral artery disease) Occlusion of left iliac artery (HCC) History of pulmonary embolism As above patient was recently hospitalized with some CT concern of aortic injury that was further evaluated the with CT angiography which found no significant changes in his chronic left iliac artery occlusion.  Vascular surgery plans to follow-up with the patient in about 1 year.  He is no longer on an antiplatelet, initially due to dysphagia but now due to being anticoagulated.  If there is no blood in his urine and no further issues with bleeding we may consider resuming  antiplatelet therapy as he can crush the aspirin . - Continue Lovenox  for anticoagulation Dark urine Ever since starting his Lovenox  he has had dark-colored orange urine without any sediment  or clots.  No bright red or pink urine.  No suprapubic pain or dysuria.  No recent urinalysis.  He is unable to urinate today for urine sample. - Return within 1 week to collect urine sample for urinalysis Left lower quadrant abdominal pain During his hospitalization he had left lower quadrant abdominal pain that seem to be connected to his constipation.  Since leaving the hospital this pain has improved and he is having normal bowel movements while using MiraLAX  daily.  He has not needed to use the Senokot.  Return precautions discussed. - Continue MiraLAX  daily  Orders Placed This Encounter  Procedures   Hepatitis C Ab reflex to Quant PCR   Urinalysis, Reflex Microscopic    Standing Status:   Future    Expiration Date:   12/04/2025   Ambulatory referral to Gastroenterology    Referral Priority:   Routine    Referral Type:   Consultation    Referral Reason:   Specialty Services Required    Number of Visits Requested:   1     Return in about 3 months (around 03/04/2025) for Follow up.   Patient discussed with Dr. Mliss Trudy Fairy Jolaine, DO [facility] Internal Medicine Resident PGY-3 Clinic Phone: [phone] Please contact the on call pager at [phone] for any urgent or emergent needs.

## 2024-12-04 NOTE — Assessment & Plan Note (Signed)
 During his hospitalization he had left lower quadrant abdominal pain that seem to be connected to his constipation.  Since leaving the hospital this pain has improved and he is having normal bowel movements while using MiraLAX  daily.  He has not needed to use the Senokot.  Return precautions discussed. - Continue MiraLAX  daily

## 2024-12-05 LAB — HCV AB W REFLEX TO QUANT PCR: HCV Ab: NONREACTIVE

## 2024-12-05 LAB — HCV INTERPRETATION

## 2024-12-06 NOTE — Progress Notes (Signed)
 Internal Medicine Clinic Attending  Case discussed with the resident at the time of the visit.  We reviewed the resident's history and exam and pertinent patient test results.  I agree with the assessment, diagnosis, and plan of care documented in the resident's note.

## 2024-12-08 NOTE — Progress Notes (Unsigned)
 "  Cardiology Office Note    Date:  12/10/2024  ID:  Todd Estrada, DOB 09-13-1961, MRN [id number] PCP:  [name], [name], DO  Cardiologist:  Stanly DELENA Leavens, MD  Electrophysiologist:  None   Chief Complaint: Follow up for atypical chest pain  History of Present Illness: .    Todd Estrada is a 63 y.o. male with visit-pertinent history of asthma/emphysema, atypical chest pain, hypertension, hypotension on midodrine , GERD, hyperlipidemia, lung cancer, COPD, stroke with right sided defects and aphasia, seizure disorder, PAD, history of PE on Eliquis .  Patient had CTA of the chest in June 2024 that showed atherosclerosis of coronary arteries including calcified atherosclerotic plaque in the LM, LAD, LCx and RCA.  Echo in 09/2022 with EF 45 to 50%, global hypokinesis, RV normal, normal valves.  Echo in 03/07/2022 and 09/07/2021 with a EF 40 to 45%, G1 DD.  Patient has history of lung cancer, nonself presented with upper left lobe lung mass in 05/2022 and also non-small cell lung cancer, squamous cell carcinoma presented with right upper lobe lung nodule.  Patient with history of chemoradiation.  Echocardiogram in 06/2023 indicated LVEF 60 to 65%, mild LVH, G1 DD, RV systolic function was mildly reduced, trivial mitral regurgitation, no evidence of mitral stenosis, aortic valve vegetation not visualized, no stenosis present, borderline dilation measuring 39 mm.  Patient presented to the emergency department in 04/2024 with several days of cough, congestion, he slid out of bed and was hypoxic on arrival.  Workup consistent with multifocal pneumonia.  During admission patient developed atrial fibrillation with RVR, self converted to sinus rhythm.  Patient was in the ED in 05/2024 with chest pain and shortness of breath, troponin and BNP were normal.  Again in the ED multiple times in 05/2024 with abdominal pain, flank pain, workup reassuring.  Patient was last seen in clinic on 06/10/2024.  They noted  that blood pressure fluctuated at home.  He denied any chest pain or palpitations.  No medication changes were made.  110-month follow-up recommended.  Patient was hospitalized in July 2025 for nonocclusive segmental PE in the left lower lobe and age indeterminant DVT of left femoral vein.  He is now on Lovenox  100 mg twice daily.  Patient was admitted from 11/21/2024 through 11/24/2024 presenting with abdominal pain after he sustained an accidental fall at home.  Reportedly fell on a wooden desk when he tried to get off of the toilet.  CT chest, abdomen pelvis raise concern for acute aortic injury infrarenally, vascular surgery was consulted and he was transferred from [facility] for vascular surgery evaluation.  Patient was valued by vascular surgery and had a repeat CT abdomen and pelvis on 11/22/2024 that showed chronic occlusions involving left common iliac, origin of IMA and bilateral SFA and moderate calcified aortic plaque without aneurysm but eccentric mural thrombus in juxtarenal and infrarenal segments without high-grade stenosis or dissection.  Patient was cleared for discharge by vascular surgery, started on bowel regimen for constipation.  Today he presents for follow-up with his cousin and caregiver.  They deny any cardiac concerns or complaints today.  Patient with history of aphasia, is able to nod and shake his head to answer.  He denies any chest pain, shortness of breath, orthopnea or PND.  He denies any presyncope or syncope.  Patient endorses right arm pain with movement of the arm, plans to obtain a sling and start taking Tylenol  as needed.  Patient's cousin notes chronic swelling of  the ankles, he reports that patient does not elevate them throughout the day regularly, if he does elevate notes improvement.  Labwork independently reviewed: 12/02/2024: Sodium 140, potassium 4.7, creatinine 0.88, AST 25, ALT 21 ROS: .   Today he denies chest pain, shortness of breath,  lower extremity edema, fatigue, palpitations, melena, hematuria, hemoptysis, diaphoresis, weakness, presyncope, syncope, orthopnea, and PND.  All other systems are reviewed and otherwise negative. Studies Reviewed: SABRA   EKG:  EKG is not ordered today.  CV Studies: Cardiac studies reviewed are outlined and summarized above. Otherwise please see EMR for full report. Cardiac Studies & Procedures   ______________________________________________________________________________________________     ECHOCARDIOGRAM  ECHOCARDIOGRAM COMPLETE 07/04/2023  Narrative ECHOCARDIOGRAM REPORT    Patient Name:   Todd Estrada Date of Exam: 07/04/2023 Medical Rec #:  [id number]     Height:       72.0 in Accession #:    [id number]    Weight:       165.0 lb Date of Birth:  1961-08-02     BSA:          1.963 m Patient Age:    62 years      BP:           119/73 mmHg Patient Gender: M             HR:           76 bpm. Exam Location:  Inpatient  Procedure: 2D Echo, Cardiac Doppler, Color Doppler and Intracardiac Opacification Agent  Indications:    CHF I50.9  History:        Patient has prior history of Echocardiogram examinations, most recent 10/13/2022. CHF, Stroke, PAD and Emphysema; Risk Factors:Dyslipidemia and Former Smoker.  Sonographer:    Tillman Nora RVT RCS Referring Phys: [phone] Keir MANUEL ORTIZ   Sonographer Comments: Technically challenging study due to limited acoustic windows, Technically difficult study due to poor echo windows, suboptimal parasternal window, suboptimal apical window and suboptimal subcostal window. Technically difficult exam due to very poor and limited windows; attempted Definity . IMPRESSIONS   1. Left ventricular ejection fraction, by estimation, is 60 to 65%. The left ventricle has normal function. Left ventricular endocardial border not optimally defined to evaluate regional wall motion. There is mild left ventricular hypertrophy. Left ventricular diastolic  parameters are consistent with Grade I diastolic dysfunction (impaired relaxation). 2. Right ventricular systolic function is mildly reduced. The right ventricular size is normal. 3. The mitral valve is normal in structure. Trivial mitral valve regurgitation. No evidence of mitral stenosis. 4. The aortic valve was not well visualized. Aortic valve regurgitation is not visualized. No aortic stenosis is present. 5. Aortic dilatation noted. There is borderline dilatation of the ascending aorta, measuring 39 mm. 6. The inferior vena cava is normal in size with greater than 50% respiratory variability, suggesting right atrial pressure of 3 mmHg. 7. Technically very limited study with poor images even with Definity  contrast. LV function appears normal. Suspect RV function mildly decreased.  FINDINGS Left Ventricle: Left ventricular ejection fraction, by estimation, is 60 to 65%. The left ventricle has normal function. Left ventricular endocardial border not optimally defined to evaluate regional wall motion. Definity  contrast agent was given IV to delineate the left ventricular endocardial borders. The left ventricular internal cavity size was normal in size. There is mild left ventricular hypertrophy. Left ventricular diastolic parameters are consistent with Grade I diastolic dysfunction (impaired relaxation).  Right Ventricle: The right ventricular size is normal. No increase  in right ventricular wall thickness. Right ventricular systolic function is mildly reduced.  Left Atrium: Left atrial size was normal in size.  Right Atrium: Right atrial size was normal in size.  Pericardium: There is no evidence of pericardial effusion.  Mitral Valve: The mitral valve is normal in structure. Trivial mitral valve regurgitation. No evidence of mitral valve stenosis.  Tricuspid Valve: The tricuspid valve is not well visualized. Tricuspid valve regurgitation is trivial. No evidence of tricuspid  stenosis.  Aortic Valve: The aortic valve was not well visualized. Aortic valve regurgitation is not visualized. No aortic stenosis is present.  Pulmonic Valve: The pulmonic valve was not well visualized. Pulmonic valve regurgitation is not visualized. No evidence of pulmonic stenosis.  Aorta: Aortic dilatation noted. There is borderline dilatation of the ascending aorta, measuring 39 mm.  Venous: The inferior vena cava is normal in size with greater than 50% respiratory variability, suggesting right atrial pressure of 3 mmHg.  IAS/Shunts: No atrial level shunt detected by color flow Doppler.  Additional Comments: A venous catheter is visualized.   LEFT VENTRICLE PLAX 2D LVIDd:         4.80 cm   Diastology LVIDs:         3.90 cm   LV e' medial:    8.16 cm/s LV PW:         1.10 cm   LV E/e' medial:  7.1 LV IVS:        1.10 cm   LV e' lateral:   10.00 cm/s LVOT diam:     2.20 cm   LV E/e' lateral: 5.8 LV SV:         63 LV SV Index:   32 LVOT Area:     3.80 cm   RIGHT VENTRICLE             IVC RV S prime:     12.90 cm/s  IVC diam: 1.20 cm TAPSE (M-mode): 2.8 cm  LEFT ATRIUM         Index LA diam:    3.60 cm 1.83 cm/m AORTIC VALVE             PULMONIC VALVE LVOT Vmax:   84.20 cm/s  PV Vmax:       0.90 m/s LVOT Vmean:  61.900 cm/s PV Peak grad:  3.3 mmHg LVOT VTI:    0.165 m  AORTA Ao Root diam: 3.50 cm Ao Asc diam:  3.90 cm  MITRAL VALVE MV Area (PHT): 2.54 cm    SHUNTS MV Decel Time: 299 msec    Systemic VTI:  0.16 m MV E velocity: 57.80 cm/s  Systemic Diam: 2.20 cm MV A velocity: 75.80 cm/s MV E/A ratio:  0.76  Toribio Fuel MD Electronically signed by Toribio Fuel MD Signature Date/Time: 07/04/2023/4:21:28 PM    Final          ______________________________________________________________________________________________       Current Reported Medications:.    Active Medications[1]  Physical Exam:    VS:  BP 118/78   Pulse 77   Ht 6'  (1.829 m)   Wt 200 lb 9.6 oz (91 kg)   SpO2 99%   BMI 27.21 kg/m    Wt Readings from Last 3 Encounters:  12/10/24 200 lb 9.6 oz (91 kg)  12/04/24 196 lb (88.9 kg)  12/02/24 194 lb 0.1 oz (88 kg)    GEN: Well nourished, well developed in no acute distress NECK: No JVD; No carotid bruits CARDIAC: RRR, no murmurs,  rubs, gallops RESPIRATORY:  Clear to auscultation without rales, wheezing or rhonchi  ABDOMEN: Soft, non-tender, non-distended EXTREMITIES:  No edema; No acute deformity     Asessement and Plan:.    CAD: Patient with coronary calcifications noted on CT. Patient with history of atypical chest pain, per notes from oncologist in 10/2024 patient has right sided chest pain related to radiation from previous radiation port, is not cardiac in origin.  Today he denies any chest pain, denies any left arm pain.  He notes some right arm pain when he moves the arm, patient's cousin notes that he is planning to obtain a sling. Avoid aspirin  as long as he is on anticoagulation.  Atrial fibrillation: Patient with episode of atrial fibrillation in 04/2024 in setting of acute illness.  Patient self converted. No longer on Eliquis  in setting of nonocclusive segmental PE to the left lower lobe in 06/2024 felt to be secondary to metastatic cancer despite being on Eliquis .  Patient now on Lovenox  injections.  He denies any further bleeding problems.  Hx of ischemic stroke: Patient no longer on Eliquis , now on Lovenox  injections.  Continue statin.  PAD: Patient noted to have moderate to severe peripheral arterial disease, monitored by vascular surgery.  On Lovenox  and statin medications.  He will follow-up with vascular.  Lung cancer: Patient followed by Dr. Gatha with oncology, last seen 11/04/2024.  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   Disposition: F/u with Dr. Santo in 6 months or sooner if  needed.   Signed, Liah Morr D Adeyemi Hamad, NP       [1]  Current Meds  Medication Sig   albuterol  (PROVENTIL ) (2.5 MG/3ML) 0.083% nebulizer solution Take 3 mLs (2.5 mg total) by nebulization every 4 (four) hours as needed for wheezing or shortness of breath.   atorvastatin  (LIPITOR) 40 MG tablet Take 1 tablet (40 mg total) by mouth daily.   enoxaparin  (LOVENOX ) 100 MG/ML injection Inject 0.85 mLs (85 mg total) into the skin every 12 (twelve) hours.   Multiple Vitamin (MULTIVITAMIN WITH MINERALS) TABS tablet Take 1 tablet by mouth daily.   ondansetron  (ZOFRAN -ODT) 4 MG disintegrating tablet Take 1 tablet (4 mg total) by mouth every 8 (eight) hours as needed for nausea or vomiting.   polyethylene glycol powder (MIRALAX ) 17 GM/SCOOP powder Take 17 g by mouth 2 (two) times daily as needed for mild constipation.   senna-docusate (SENOKOT-S) 8.6-50 MG tablet Take 1 tablet by mouth 2 (two) times daily between meals as needed for mild constipation.   sucralfate  (CARAFATE ) 1 g tablet TAKE 1 TABLET(1 GRAM) BY MOUTH FOUR TIMES DAILY( WITH MEALS AND AT BEDTIME)   Tiotropium Bromide-Olodaterol (STIOLTO RESPIMAT ) 2.5-2.5 MCG/ACT AERS NEW PRESCRIPTION REQUEST: STIOLTO 2.5 MCG- INHALE TWO PUFFS BY MOUTH DAILY   "

## 2024-12-10 ENCOUNTER — Ambulatory Visit: Admitting: Cardiology

## 2024-12-10 ENCOUNTER — Encounter: Payer: Self-pay | Admitting: Cardiology

## 2024-12-10 VITALS — BP 118/78 | HR 77 | Ht 72.0 in | Wt 200.6 lb

## 2024-12-10 DIAGNOSIS — I739 Peripheral vascular disease, unspecified: Secondary | ICD-10-CM | POA: Diagnosis not present

## 2024-12-10 DIAGNOSIS — Z86711 Personal history of pulmonary embolism: Secondary | ICD-10-CM | POA: Insufficient documentation

## 2024-12-10 DIAGNOSIS — I480 Paroxysmal atrial fibrillation: Secondary | ICD-10-CM | POA: Diagnosis not present

## 2024-12-10 DIAGNOSIS — I63512 Cerebral infarction due to unspecified occlusion or stenosis of left middle cerebral artery: Secondary | ICD-10-CM | POA: Insufficient documentation

## 2024-12-10 DIAGNOSIS — C3490 Malignant neoplasm of unspecified part of unspecified bronchus or lung: Secondary | ICD-10-CM | POA: Diagnosis not present

## 2024-12-10 DIAGNOSIS — I2510 Atherosclerotic heart disease of native coronary artery without angina pectoris: Secondary | ICD-10-CM | POA: Insufficient documentation

## 2024-12-10 DIAGNOSIS — J449 Chronic obstructive pulmonary disease, unspecified: Secondary | ICD-10-CM | POA: Insufficient documentation

## 2024-12-10 DIAGNOSIS — I959 Hypotension, unspecified: Secondary | ICD-10-CM | POA: Diagnosis not present

## 2024-12-10 NOTE — Patient Instructions (Signed)
 Medication Instructions:  Your physician recommends that you continue on your current medications as directed. Please refer to the Current Medication list given to you today.  *If you need a refill on your cardiac medications before your next appointment, please call your pharmacy*  Lab Work: NONE If you have labs (blood work) drawn today and your tests are completely normal, you will receive your results only by: MyChart Message (if you have MyChart) OR A paper copy in the mail If you have any lab test that is abnormal or we need to change your treatment, we will call you to review the results.  Testing/Procedures: NONE  Follow-Up: At [facility], you and your health needs are our priority.  As part of our continuing mission to provide you with exceptional heart care, our providers are all part of one team.  This team includes your primary Cardiologist (physician) and Advanced Practice Providers or APPs (Physician Assistants and Nurse Practitioners) who all work together to provide you with the care you need, when you need it.  Your next appointment:   6 month(s)  Provider:   Jann Melody, MD   We recommend signing up for the patient portal called "MyChart".  Sign up information is provided on this After Visit Summary.  MyChart is used to connect with patients for Virtual Visits (Telemedicine).  Patients are able to view lab/test results, encounter notes, upcoming appointments, etc.  Non-urgent messages can be sent to your provider as well.   To learn more about what you can do with MyChart, go to [email or web address].

## 2024-12-18 ENCOUNTER — Encounter (HOSPITAL_COMMUNITY): Payer: Self-pay | Admitting: Emergency Medicine

## 2024-12-18 ENCOUNTER — Emergency Department (HOSPITAL_COMMUNITY)

## 2024-12-18 ENCOUNTER — Other Ambulatory Visit: Payer: Self-pay

## 2024-12-18 ENCOUNTER — Emergency Department (HOSPITAL_COMMUNITY): Admission: EM | Admit: 2024-12-18 | Discharge: 2024-12-18 | Disposition: A | Discharge status: Home / Self Care

## 2024-12-18 DIAGNOSIS — F0390 Unspecified dementia without behavioral disturbance: Secondary | ICD-10-CM | POA: Diagnosis not present

## 2024-12-18 DIAGNOSIS — R109 Unspecified abdominal pain: Secondary | ICD-10-CM | POA: Insufficient documentation

## 2024-12-18 DIAGNOSIS — K5900 Constipation, unspecified: Secondary | ICD-10-CM | POA: Diagnosis not present

## 2024-12-18 LAB — COMPREHENSIVE METABOLIC PANEL WITH GFR
ALT: 40 U/L (ref 0–44)
AST: 29 U/L (ref 15–41)
Albumin: 4 g/dL (ref 3.5–5.0)
Alkaline Phosphatase: 107 U/L (ref 38–126)
Anion gap: 8 (ref 5–15)
BUN: 16 mg/dL (ref 8–23)
CO2: 28 mmol/L (ref 22–32)
Calcium: 9.4 mg/dL (ref 8.9–10.3)
Chloride: 104 mmol/L (ref 98–111)
Creatinine, Ser: 0.89 mg/dL (ref 0.61–1.24)
GFR, Estimated: >60 mL/min
Glucose, Bld: 94 mg/dL (ref 70–99)
Potassium: 4.3 mmol/L (ref 3.5–5.1)
Sodium: 139 mmol/L (ref 135–145)
Total Bilirubin: 0.3 mg/dL (ref 0.0–1.2)
Total Protein: 7.1 g/dL (ref 6.5–8.1)

## 2024-12-18 LAB — CBC
HCT: 39.1 % (ref 39.0–52.0)
Hemoglobin: 12.4 g/dL — ABNORMAL LOW (ref 13.0–17.0)
MCH: 29.4 pg (ref 26.0–34.0)
MCHC: 31.7 g/dL (ref 30.0–36.0)
MCV: 92.7 fL (ref 80.0–100.0)
Platelets: 147 K/uL — ABNORMAL LOW (ref 150–400)
RBC: 4.22 MIL/uL (ref 4.22–5.81)
RDW: 13.5 % (ref 11.5–15.5)
WBC: 4.6 K/uL (ref 4.0–10.5)
nRBC: 0 % (ref 0.0–0.2)

## 2024-12-18 LAB — LIPASE, BLOOD: Lipase: 17 U/L (ref 11–51)

## 2024-12-18 MED ORDER — KETOROLAC TROMETHAMINE 15 MG/ML IJ SOLN
15.0000 mg | Freq: Once | INTRAMUSCULAR | Status: AC
  Administered 2024-12-18: 15 mg via INTRAMUSCULAR
  Filled 2024-12-18: qty 1

## 2024-12-18 MED ORDER — DOCUSATE SODIUM 100 MG PO CAPS: 100.0000 mg | ORAL_CAPSULE | Freq: Two times a day (BID) | ORAL | 0 refills | Status: AC | PRN

## 2024-12-18 MED ORDER — IOHEXOL 300 MG/ML  SOLN
100.0000 mL | Freq: Once | INTRAMUSCULAR | Status: AC | PRN
  Administered 2024-12-18: 100 mL via INTRAVENOUS

## 2024-12-18 NOTE — ED Provider Notes (Signed)
 " [place] EMERGENCY DEPARTMENT AT [facility] Provider Note   CSN: [id number] Arrival date & time: 12/18/24  1448     Patient presents with: Abdominal Pain   Todd Estrada is a 63 y.o. male.   63 year old male presents for evaluation of abdominal pain.  He is from a nursing facility and is a very poor historian at baseline.  He answers yes and no questions but per report he came in for right sided abdominal pain.  He states that it comes and goes, denies any urinary symptoms, back pain, nausea, vomiting, or diarrhea.  Further history limited at this time.   Abdominal Pain      Prior to Admission medications  Medication Sig Start Date End Date Taking? Authorizing Provider  docusate sodium  (COLACE) 100 MG capsule Take 1 capsule (100 mg total) by mouth 2 (two) times daily as needed for up to 14 days for mild constipation. 12/18/24 01/01/25 Yes Dominga Mcduffie L, DO  albuterol  (PROVENTIL ) (2.5 MG/3ML) 0.083% nebulizer solution Take 3 mLs (2.5 mg total) by nebulization every 4 (four) hours as needed for wheezing or shortness of breath. 08/16/22   Lou Claretta HERO, MD  atorvastatin  (LIPITOR) 40 MG tablet Take 1 tablet (40 mg total) by mouth daily. 08/14/24 08/14/25  [name], [name], DO  enoxaparin  (LOVENOX ) 100 MG/ML injection Inject 0.85 mLs (85 mg total) into the skin every 12 (twelve) hours. 11/18/24   Nguyen, Diana, MD  Multiple Vitamin (MULTIVITAMIN WITH MINERALS) TABS tablet Take 1 tablet by mouth daily.    [provider]  ondansetron  (ZOFRAN -ODT) 4 MG disintegrating tablet Take 1 tablet (4 mg total) by mouth every 8 (eight) hours as needed for nausea or vomiting. 10/17/24   Baron, Lauren E, PA  polyethylene glycol powder (MIRALAX ) 17 GM/SCOOP powder Take 17 g by mouth 2 (two) times daily as needed for mild constipation. 11/23/24   Gonfa, Taye T, MD  senna-docusate (SENOKOT-S) 8.6-50 MG tablet Take 1 tablet by mouth 2 (two) times daily between meals as needed for  mild constipation. 11/23/24   Gonfa, Taye T, MD  sucralfate  (CARAFATE ) 1 g tablet TAKE 1 TABLET(1 GRAM) BY MOUTH FOUR TIMES DAILY( WITH MEALS AND AT BEDTIME) 09/19/24   Tobie Gaines, DO  Tiotropium Bromide-Olodaterol (STIOLTO RESPIMAT ) 2.5-2.5 MCG/ACT AERS NEW PRESCRIPTION REQUEST: STIOLTO 2.5 MCG- INHALE TWO PUFFS BY MOUTH DAILY 08/14/24   [name], [name], DO    Allergies: Patient has no known allergies.    Review of Systems  Unable to perform ROS: Dementia  Gastrointestinal:  Positive for abdominal pain.    Updated Vital Signs BP 125/83 (BP Location: Left Arm)   Pulse 80   Temp 99.5 F (37.5 C) (Oral)   Resp 18   SpO2 98%   Physical Exam Vitals and nursing note reviewed.  Constitutional:      General: He is not in acute distress.    Appearance: He is well-developed. He is not ill-appearing.  HENT:     Head: Normocephalic and atraumatic.  Eyes:     Conjunctiva/sclera: Conjunctivae normal.  Cardiovascular:     Rate and Rhythm: Normal rate and regular rhythm.     Heart sounds: No murmur heard. Pulmonary:     Effort: Pulmonary effort is normal. No respiratory distress.     Breath sounds: Normal breath sounds.  Abdominal:     Palpations: Abdomen is soft.     Tenderness: There is no abdominal tenderness.  Musculoskeletal:        General:  No swelling.     Cervical back: Neck supple.  Skin:    General: Skin is warm and dry.     Capillary Refill: Capillary refill takes less than 2 seconds.  Neurological:     Mental Status: He is alert.  Psychiatric:        Mood and Affect: Mood normal.     (all labs ordered are listed, but only abnormal results are displayed) Labs Reviewed  CBC - Abnormal; Notable for the following components:      Result Value   Hemoglobin 12.4 (*)    Platelets 147 (*)    All other components within normal limits  LIPASE, BLOOD  COMPREHENSIVE METABOLIC PANEL WITH GFR  URINALYSIS, ROUTINE W REFLEX MICROSCOPIC    EKG: None  Radiology: DG Chest  Port 1 View Result Date: 12/18/2024 EXAM: 1 VIEW(S) XRAY OF THE CHEST 12/18/2024 07:30:26 PM COMPARISON: 11/29/2024 CLINICAL HISTORY: CP FINDINGS: LUNGS AND PLEURA: Left hilar pulmonary mass mass is grossly unchanged. Peripheral opacity within the right midlung zone containing a fiducial marker is stable. Emphysema. No superimposed confluent pulmonary infiltrate. No pneumothorax or pleural effusion. HEART AND MEDIASTINUM: Calcified aorta. No acute abnormality of the cardiac and mediastinal silhouettes. BONES AND SOFT TISSUES: No acute osseous abnormality. IMPRESSION: 1. No confluent pulmonary infiltrate, pneumothorax, or pleural effusion. 2. Left hilar pulmonary mass, grossly unchanged 3. stable peripheral right midlung opacity containing a fiducial marker. 4. Emphysema and calcified aorta. Electronically signed by: Dorethia Molt MD 12/18/2024 08:43 PM EST RP Workstation: [id number]   CT ABDOMEN PELVIS W CONTRAST Result Date: 12/18/2024 EXAM: CT ABDOMEN AND PELVIS WITH CONTRAST 12/18/2024 05:44:37 PM TECHNIQUE: CT of the abdomen and pelvis was performed with the administration of intravenous contrast. Multiplanar reformatted images are provided for review. Automated exposure control, iterative reconstruction, and/or weight-based adjustment of the mA/kV was utilized to reduce the radiation dose to as low as reasonably achievable. COMPARISON: 11/22/2024 CLINICAL HISTORY: right sided abd pain FINDINGS: LOWER CHEST: Infiltrate within the basilar lingula is noted, new from prior examination, indeterminate. This may be infectious or reflective of asymmetric pulmonary edema in the acute setting. Mild bibasilar parenchymal scarring again noted. LIVER: Mild hepatic steatosis. GALLBLADDER AND BILE DUCTS: Status post cholecystectomy. No biliary ductal dilatation. SPLEEN: No acute abnormality. PANCREAS: No acute abnormality. ADRENAL GLANDS: No acute abnormality. KIDNEYS, URETERS AND BLADDER: Multiple calcifications are  seen within the renal hila bilaterally which are favored to represent vascular calcifications. The kidneys are otherwise unremarkable. No stones in the kidneys or ureters. No hydronephrosis. No perinephric or periureteral stranding. Urinary bladder is unremarkable. GI AND BOWEL: Moderate colonic stool burden. The appendix is normal. The stomach, small bowel, and large bowel are otherwise unremarkable. There is no bowel obstruction. PERITONEUM AND RETROPERITONEUM: No ascites. No free air. VASCULATURE: Extensive aortoiliac atherosclerotic calcification. Patency of the rectosigmoid branches of the inferior mesenteric artery are not confirmed on this examination. Extensive mixed atherosclerotic plaque results in moderate segment occlusion of the left common iliac artery and multifocal high-grade stenosis of the lower extremity arterial inflow bilaterally. Peripheral vascular disease. If there is clinical evidence of hemodynamically significant renal artery stenosis, chronic mesenteric ischemia, or lower extremity claudication, CT arteriography may be more helpful for further evaluation. LYMPH NODES: No lymphadenopathy. REPRODUCTIVE ORGANS: No acute abnormality. BONES AND SOFT TISSUES: Osseous structures are age appropriate. No acute bone abnormality. No lytic or blastic bone lesion. No focal soft tissue abnormality. IMPRESSION: 1. No acute abdominopelvic abnormality is identified to explain right-sided abdominal pain; moderate  colonic stool burden and a normal appendix are noted. 2. New infiltrate within the basilar lingula, indeterminate, possibly infectious or reflective of asymmetric pulmonary edema in the acute setting. 3. Extensive mixed atherosclerotic plaque with moderate segment occlusion of the left common iliac artery and multifocal high-grade stenosis of the lower extremity arterial inflow bilaterally. These findings are better assessed on prior CT arteriogram of Nov 22, 2024. if there are corresponding  symptoms of lower extremity arterial insufficiency, vascular consultation advised for further management. 4. Mild hepatic steatosis. Electronically signed by: Dorethia Molt MD 12/18/2024 07:08 PM EST RP Workstation: [id number]     Procedures   Medications Ordered in the ED  ketorolac  (TORADOL ) 15 MG/ML injection 15 mg (has no administration in time range)  iohexol  (OMNIPAQUE ) 300 MG/ML solution 100 mL (100 mLs Intravenous Contrast Given 12/18/24 1739)                                    Medical Decision Making Social determinants of health: Patient with history of dementia, poor historian  Patient here for abdominal pain.  He is minimally tender on exam and no other concerning signs or symptoms, vitals are stable and lab workup unremarkable.  CT abdomen/pelvis shows some constipation but no acute abnormality in the abdomen to explain the patient's symptoms.  Will give him Toradol  here and will start him on Colace.  Advise close up with primary care doctor and otherwise return to the ER for new or worsening symptoms.  He feels comfortable with the plan to be discharged.  Problems Addressed: Abdominal pain, non-surgical: acute illness or injury Constipation, unspecified constipation type: acute illness or injury  Amount and/or Complexity of Data Reviewed External Data Reviewed: notes.    Details: Prior ED records reviewed and patient seen on 12/02/2024 and patient seen for chest pain  Labs: ordered. Decision-making details documented in ED Course.    Details: Ordered and reviewed by me and unremarkable Radiology: ordered and independent interpretation performed. Decision-making details documented in ED Course.    Details:  Ordered and interpreted by me independently of radiology  chest x-ray: Shows no acute abnormality CT abdomen pelvis: Shows no acute abnormality but does show some constipation  Risk OTC drugs. Prescription drug management. Diagnosis or treatment significantly  limited by social determinants of health.     Final diagnoses:  Abdominal pain, non-surgical  Constipation, unspecified constipation type    ED Discharge Orders          Ordered    docusate sodium  (COLACE) 100 MG capsule  2 times daily PRN        12/18/24 2313               Idan Prime L, DO 12/18/24 2318  "

## 2024-12-18 NOTE — Discharge Instructions (Addendum)
 You can alternate Tylenol  and Motrin  as needed for pain.  Take your Colace up to 2 times a day as needed for constipation.  Follow-up with your primary care doctor in 1 to 2 weeks.

## 2024-12-18 NOTE — ED Triage Notes (Signed)
 Pt with RLQ pain that began this morning.  Pt reports no n/v/d or shob.  No urinary symptoms.

## 2024-12-18 NOTE — ED Provider Triage Note (Signed)
 Emergency Medicine Provider Triage Evaluation Note  Todd Estrada , a 63 y.o. male  was evaluated in triage.  Pt complains of right-sided abdominal pain which has been ongoing for the past 2 days.  Patient denies any associated nausea, vomiting, diarrhea.  He has had no associated chest pain or shortness of breath..  Review of Systems  Positive: Abdominal pain Negative: Nausea, vomiting, diarrhea, chest pain  Physical Exam  BP (!) 158/86 (BP Location: Left Arm)   Pulse 80   Temp 98.2 F (36.8 C) (Oral)   Resp 16   SpO2 99%  Gen:   Awake, no distress   Resp:  Normal effort  MSK:   Moves extremities without difficulty  Other:    Medical Decision Making  Medically screening exam initiated at 3:36 PM.  Appropriate orders placed.  [name] was informed that the remainder of the evaluation will be completed by another provider, this initial triage assessment does not replace that evaluation, and the importance of remaining in the ED until their evaluation is complete.  Labs and imaging ordered.  Awaiting bed in the back at this time.  Patient has no signs of acute distress at this time.   Daralene Lonni BIRCH, PA-C 12/18/24 1537

## 2024-12-25 ENCOUNTER — Encounter (HOSPITAL_COMMUNITY): Payer: Self-pay

## 2024-12-25 ENCOUNTER — Other Ambulatory Visit: Payer: Self-pay

## 2024-12-25 ENCOUNTER — Emergency Department (HOSPITAL_COMMUNITY)
Admission: EM | Admit: 2024-12-25 | Discharge: 2024-12-26 | Disposition: A | Discharge status: Home / Self Care | Attending: Emergency Medicine | Admitting: Emergency Medicine

## 2024-12-25 DIAGNOSIS — L0231 Cutaneous abscess of buttock: Secondary | ICD-10-CM | POA: Diagnosis present

## 2024-12-25 NOTE — ED Triage Notes (Signed)
 Pt. Dropped off by cousin for a cyst to the buttocks. Pt. Unable to verbally communicate with staff. PT.'s emergency contact contacted over the phone for triage.

## 2024-12-26 LAB — CBC WITH DIFFERENTIAL/PLATELET
Abs Immature Granulocytes: 0.03 K/uL (ref 0.00–0.07)
Basophils Absolute: 0 K/uL (ref 0.0–0.1)
Basophils Relative: 0 %
Eosinophils Absolute: 0.1 K/uL (ref 0.0–0.5)
Eosinophils Relative: 2 %
HCT: 41.3 % (ref 39.0–52.0)
Hemoglobin: 12.8 g/dL — ABNORMAL LOW (ref 13.0–17.0)
Immature Granulocytes: 1 %
Lymphocytes Relative: 16 %
Lymphs Abs: 1 K/uL (ref 0.7–4.0)
MCH: 29.5 pg (ref 26.0–34.0)
MCHC: 31 g/dL (ref 30.0–36.0)
MCV: 95.2 fL (ref 80.0–100.0)
Monocytes Absolute: 0.7 K/uL (ref 0.1–1.0)
Monocytes Relative: 10 %
Neutro Abs: 4.8 K/uL (ref 1.7–7.7)
Neutrophils Relative %: 71 %
Platelets: 165 K/uL (ref 150–400)
RBC: 4.34 MIL/uL (ref 4.22–5.81)
RDW: 13.6 % (ref 11.5–15.5)
WBC: 6.7 K/uL (ref 4.0–10.5)
nRBC: 0 % (ref 0.0–0.2)

## 2024-12-26 LAB — COMPREHENSIVE METABOLIC PANEL WITH GFR
ALT: 17 U/L (ref 0–44)
AST: 18 U/L (ref 15–41)
Albumin: 4.2 g/dL (ref 3.5–5.0)
Alkaline Phosphatase: 108 U/L (ref 38–126)
Anion gap: 14 (ref 5–15)
BUN: 18 mg/dL (ref 8–23)
CO2: 26 mmol/L (ref 22–32)
Calcium: 9.5 mg/dL (ref 8.9–10.3)
Chloride: 101 mmol/L (ref 98–111)
Creatinine, Ser: 1.02 mg/dL (ref 0.61–1.24)
GFR, Estimated: >60 mL/min
Glucose, Bld: 84 mg/dL (ref 70–99)
Potassium: 3.9 mmol/L (ref 3.5–5.1)
Sodium: 141 mmol/L (ref 135–145)
Total Bilirubin: 0.4 mg/dL (ref 0.0–1.2)
Total Protein: 7.3 g/dL (ref 6.5–8.1)

## 2024-12-26 MED ORDER — HYDROCODONE-ACETAMINOPHEN 5-325 MG PO TABS
1.0000 | ORAL_TABLET | Freq: Once | ORAL | Status: AC
  Administered 2024-12-26: 1 via ORAL
  Filled 2024-12-26: qty 1

## 2024-12-26 MED ORDER — CEPHALEXIN 500 MG PO CAPS: 500.0000 mg | ORAL_CAPSULE | Freq: Four times a day (QID) | ORAL | 0 refills | Status: AC

## 2024-12-26 MED ORDER — LIDOCAINE-EPINEPHRINE (PF) 2 %-1:200000 IJ SOLN
10.0000 mL | Freq: Once | INTRAMUSCULAR | Status: AC
  Administered 2024-12-26: 10 mL
  Filled 2024-12-26: qty 20

## 2024-12-26 MED ORDER — HYDROCODONE-ACETAMINOPHEN 5-325 MG PO TABS: 1.0000 | ORAL_TABLET | Freq: Four times a day (QID) | ORAL | 0 refills | Status: AC | PRN

## 2024-12-26 MED ORDER — LIDOCAINE-EPINEPHRINE (PF) 2 %-1:200000 IJ SOLN
2.0000 mL | INTRAMUSCULAR | Status: DC
  Administered 2024-12-26: 2 mL

## 2024-12-26 MED ORDER — CEPHALEXIN 500 MG PO CAPS
500.0000 mg | ORAL_CAPSULE | Freq: Once | ORAL | Status: AC
  Administered 2024-12-26: 500 mg via ORAL
  Filled 2024-12-26: qty 1

## 2024-12-26 NOTE — ED Provider Notes (Signed)
 " [place] EMERGENCY DEPARTMENT AT [facility] Provider Note   CSN: [id number] Arrival date & time: 12/25/24  1832     Patient presents with: Abscess   Todd Estrada is a 64 y.o. male.   History of severe aphasia, history provided by [name] Ronde, patient's emergency contact, by phone.   Patient to ED for evaluation of a painful area of swelling to the right buttock for the past 1-2 days. No fever, vomiting. He has been otherwise well. No drainage noted at home.   The history is provided by the patient. No language interpreter was used.  Abscess      Prior to Admission medications  Medication Sig Start Date End Date Taking? Authorizing Provider  cephALEXin  (KEFLEX ) 500 MG capsule Take 1 capsule (500 mg total) by mouth 4 (four) times daily. 12/26/24  Yes Seydina Holliman, Margit, PA-C  HYDROcodone -acetaminophen  (NORCO/VICODIN) 5-325 MG tablet Take 1 tablet by mouth every 6 (six) hours as needed for severe pain (pain score 7-10). 12/26/24  Yes Odell Margit, PA-C  albuterol  (PROVENTIL ) (2.5 MG/3ML) 0.083% nebulizer solution Take 3 mLs (2.5 mg total) by nebulization every 4 (four) hours as needed for wheezing or shortness of breath. 08/16/22   Lou Claretta HERO, MD  atorvastatin  (LIPITOR) 40 MG tablet Take 1 tablet (40 mg total) by mouth daily. 08/14/24 08/14/25  [name], [name], DO  docusate sodium  (COLACE) 100 MG capsule Take 1 capsule (100 mg total) by mouth 2 (two) times daily as needed for up to 14 days for mild constipation. 12/18/24 01/01/25  Kammerer, Megan L, DO  enoxaparin  (LOVENOX ) 100 MG/ML injection Inject 0.85 mLs (85 mg total) into the skin every 12 (twelve) hours. 11/18/24   Nguyen, Diana, MD  Multiple Vitamin (MULTIVITAMIN WITH MINERALS) TABS tablet Take 1 tablet by mouth daily.    [provider]  ondansetron  (ZOFRAN -ODT) 4 MG disintegrating tablet Take 1 tablet (4 mg total) by mouth every 8 (eight) hours as needed for nausea or vomiting. 10/17/24   Baron, Lauren E,  PA  polyethylene glycol powder (MIRALAX ) 17 GM/SCOOP powder Take 17 g by mouth 2 (two) times daily as needed for mild constipation. 11/23/24   Gonfa, Taye T, MD  senna-docusate (SENOKOT-S) 8.6-50 MG tablet Take 1 tablet by mouth 2 (two) times daily between meals as needed for mild constipation. 11/23/24   Gonfa, Taye T, MD  sucralfate  (CARAFATE ) 1 g tablet TAKE 1 TABLET(1 GRAM) BY MOUTH FOUR TIMES DAILY( WITH MEALS AND AT BEDTIME) 09/19/24   Tobie Gaines, DO  Tiotropium Bromide-Olodaterol (STIOLTO RESPIMAT ) 2.5-2.5 MCG/ACT AERS NEW PRESCRIPTION REQUEST: STIOLTO 2.5 MCG- INHALE TWO PUFFS BY MOUTH DAILY 08/14/24   [name], [name], DO    Allergies: Patient has no known allergies.    Review of Systems  Updated Vital Signs BP (!) 142/80 (BP Location: Left Arm)   Pulse 74   Temp 99 F (37.2 C) (Oral)   Resp 16   SpO2 96%   Physical Exam Vitals and nursing note reviewed.  Constitutional:      Appearance: Normal appearance. He is not ill-appearing.  Cardiovascular:     Rate and Rhythm: Normal rate.  Pulmonary:     Effort: Pulmonary effort is normal.  Musculoskeletal:        General: Normal range of motion.  Skin:    General: Skin is warm and dry.     Findings: Erythema and lesion present.     Comments: Fluctuant,erythematous swelling to upper right buttock c/w cutaneous abscess. No surround  cellulitic changes.   Neurological:     Mental Status: He is alert.     (all labs ordered are listed, but only abnormal results are displayed) Labs Reviewed  CBC WITH DIFFERENTIAL/PLATELET  COMPREHENSIVE METABOLIC PANEL WITH GFR    EKG: None  Radiology: No results found.   .Incision and Drainage  Date/Time: 12/26/2024 1:23 AM  Performed by: Odell Balls, PA-C Authorized by: Odell Balls, PA-C   Consent:    Consent given by:  Patient Universal protocol:    Procedure explained and questions answered to patient or proxy's satisfaction: yes     Patient identity confirmed:  Verbally  with patient and arm band Location:    Type:  Abscess   Size:  3 cm diameter   Location:  Lower extremity   Lower extremity location:  Buttock   Buttock location:  R buttock Pre-procedure details:    Skin preparation:  Povidone-iodine Anesthesia:    Anesthesia method:  Local infiltration   Local anesthetic:  2 mL lidocaine -EPINEPHrine  2 %-1:200000 Procedure type:    Complexity:  Simple Procedure details:    Incision types:  Single straight   Wound management:  Probed and deloculated and irrigated with saline   Drainage:  Bloody and purulent   Drainage amount:  Moderate   Wound treatment:  Wound left open Post-procedure details:    Procedure completion:  Tolerated    Medications Ordered in the ED  HYDROcodone -acetaminophen  (NORCO/VICODIN) 5-325 MG per tablet 1 tablet (has no administration in time range)  cephALEXin  (KEFLEX ) capsule 500 mg (has no administration in time range)  lidocaine -EPINEPHrine  (XYLOCAINE  W/EPI) 2 %-1:200000 (PF) injection 10 mL (10 mLs Infiltration Given 12/26/24 0147)    Clinical Course as of 12/26/24 0222  Thu Dec 26, 2024  0215 Patient here with cutaneous abscess to right buttock. No fever. I&D performed without complication. The patient reports he has 2 areas that are painful but points close to the same area that was opened. There is no other abscess or concern for infection visualized or palpated. No fever. Not ill. Labs ordered through the triage process but felt unnecessary today. Care instructions will be in writing. Will cover with abx given patient's concern for another area that may indicate early abscess.  [SU]    Clinical Course User Index [SU] Odell Balls, PA-C                                 Medical Decision Making Risk Prescription drug management.        Final diagnoses:  Abscess of buttock, right    ED Discharge Orders          Ordered    cephALEXin  (KEFLEX ) 500 MG capsule  4 times daily        12/26/24 0221     HYDROcodone -acetaminophen  (NORCO/VICODIN) 5-325 MG tablet  Every 6 hours PRN        12/26/24 0221               Odell Balls, PA-C 12/26/24 Todd Raford Lenis, MD 12/26/24 [phone]  "

## 2024-12-26 NOTE — Discharge Instructions (Signed)
 The abscess on the right buttock was opened and drained. Care instructions are included in this discharge information. You will be on an antibiotic for the next 7 days, and can take ibuprofen  for pain, Norco for severe pain if needed.

## 2025-01-07 ENCOUNTER — Emergency Department (HOSPITAL_COMMUNITY)
Admission: EM | Admit: 2025-01-07 | Discharge: 2025-01-07 | Disposition: A | Discharge status: Home / Self Care | Attending: Emergency Medicine | Admitting: Emergency Medicine

## 2025-01-07 ENCOUNTER — Emergency Department (HOSPITAL_COMMUNITY)

## 2025-01-07 DIAGNOSIS — R079 Chest pain, unspecified: Secondary | ICD-10-CM

## 2025-01-07 DIAGNOSIS — R0789 Other chest pain: Secondary | ICD-10-CM | POA: Diagnosis present

## 2025-01-07 LAB — CBC
HCT: 39.3 % (ref 39.0–52.0)
Hemoglobin: 11.9 g/dL — ABNORMAL LOW (ref 13.0–17.0)
MCH: 28.7 pg (ref 26.0–34.0)
MCHC: 30.3 g/dL (ref 30.0–36.0)
MCV: 94.9 fL (ref 80.0–100.0)
Platelets: 172 K/uL (ref 150–400)
RBC: 4.14 MIL/uL — ABNORMAL LOW (ref 4.22–5.81)
RDW: 13.2 % (ref 11.5–15.5)
WBC: 4.8 K/uL (ref 4.0–10.5)
nRBC: 0 % (ref 0.0–0.2)

## 2025-01-07 LAB — BASIC METABOLIC PANEL WITH GFR
Anion gap: 8 (ref 5–15)
BUN: 16 mg/dL (ref 8–23)
CO2: 28 mmol/L (ref 22–32)
Calcium: 9.1 mg/dL (ref 8.9–10.3)
Chloride: 106 mmol/L (ref 98–111)
Creatinine, Ser: 0.97 mg/dL (ref 0.61–1.24)
GFR, Estimated: >60 mL/min
Glucose, Bld: 98 mg/dL (ref 70–99)
Potassium: 4.1 mmol/L (ref 3.5–5.1)
Sodium: 142 mmol/L (ref 135–145)

## 2025-01-07 LAB — TROPONIN T, HIGH SENSITIVITY
Troponin T High Sensitivity: 7 ng/L (ref 0–19)
Troponin T High Sensitivity: 8 ng/L (ref 0–19)

## 2025-01-07 MED ORDER — LIDOCAINE VISCOUS HCL 2 % MT SOLN
15.0000 mL | Freq: Once | OROMUCOSAL | Status: DC
  Filled 2025-01-07: qty 15

## 2025-01-07 MED ORDER — ALUM & MAG HYDROXIDE-SIMETH 200-200-20 MG/5ML PO SUSP
30.0000 mL | Freq: Once | ORAL | Status: DC
  Filled 2025-01-07: qty 30

## 2025-01-07 NOTE — Discharge Instructions (Signed)
 Your caregiver has diagnosed you as having chest pain that is not specific for one problem, but does not require admission.  You are at low risk for an acute heart condition or other serious illness. Chest pain comes from many different causes.  SEEK IMMEDIATE MEDICAL ATTENTION IF: You have severe chest pain, especially if the pain is crushing or pressure-like and spreads to the arms, back, neck, or jaw, or if you have sweating, nausea (feeling sick to your stomach), or shortness of breath. THIS IS AN EMERGENCY. Don't wait to see if the pain will go away. Get medical help at once. Call 911 or 0 (operator). DO NOT drive yourself to the hospital.  Your chest pain gets worse and does not go away with rest.  You have an attack of chest pain lasting longer than usual, despite rest and treatment with the medications your caregiver has prescribed.  You wake from sleep with chest pain or shortness of breath.  You feel dizzy or faint.  You have chest pain not typical of your usual pain for which you originally saw your caregiver.

## 2025-01-07 NOTE — ED Triage Notes (Signed)
 Pt arrives from home via EMS. Pts cousin called EMS reporting the pt has chest pain when coughing. Pt has a communication barrier.

## 2025-01-07 NOTE — ED Provider Notes (Signed)
 " [place] EMERGENCY DEPARTMENT AT [facility] Provider Note   CSN: [id number] Arrival date & time: 01/07/25  1338     Patient presents with: No chief complaint on file.   Todd Estrada is a 64 y.o. male with a past medical history of CVA, right sided hemiparesis and expressive aphasia who presents with chest pain.  He is well-known to this emergency department and is a frequent visitor.  He frequently complains of chest pain.  He is unable to speak but has no receptive aphasia and can answer questions.  He reports central chest pain which he points to.  He nods yes to pressure and aching.  He denies diaphoresis, nausea, vomiting or shortness of breath.   HPI     Prior to Admission medications  Medication Sig Start Date End Date Taking? Authorizing Provider  albuterol  (PROVENTIL ) (2.5 MG/3ML) 0.083% nebulizer solution Take 3 mLs (2.5 mg total) by nebulization every 4 (four) hours as needed for wheezing or shortness of breath. 08/16/22   Lou Claretta HERO, MD  atorvastatin  (LIPITOR) 40 MG tablet Take 1 tablet (40 mg total) by mouth daily. 08/14/24 08/14/25  [name], [name], DO  cephALEXin  (KEFLEX ) 500 MG capsule Take 1 capsule (500 mg total) by mouth 4 (four) times daily. 12/26/24   Odell Balls, PA-C  enoxaparin  (LOVENOX ) 100 MG/ML injection Inject 0.85 mLs (85 mg total) into the skin every 12 (twelve) hours. 11/18/24   Nguyen, Diana, MD  HYDROcodone -acetaminophen  (NORCO/VICODIN) 5-325 MG tablet Take 1 tablet by mouth every 6 (six) hours as needed for severe pain (pain score 7-10). 12/26/24   Odell Balls, PA-C  Multiple Vitamin (MULTIVITAMIN WITH MINERALS) TABS tablet Take 1 tablet by mouth daily.    [provider]  ondansetron  (ZOFRAN -ODT) 4 MG disintegrating tablet Take 1 tablet (4 mg total) by mouth every 8 (eight) hours as needed for nausea or vomiting. 10/17/24   Baron, Lauren E, PA  polyethylene glycol powder (MIRALAX ) 17 GM/SCOOP powder Take 17 g by mouth 2 (two)  times daily as needed for mild constipation. 11/23/24   Gonfa, Taye T, MD  senna-docusate (SENOKOT-S) 8.6-50 MG tablet Take 1 tablet by mouth 2 (two) times daily between meals as needed for mild constipation. 11/23/24   Gonfa, Taye T, MD  sucralfate  (CARAFATE ) 1 g tablet TAKE 1 TABLET(1 GRAM) BY MOUTH FOUR TIMES DAILY( WITH MEALS AND AT BEDTIME) 09/19/24   Tobie Gaines, DO  Tiotropium Bromide-Olodaterol (STIOLTO RESPIMAT ) 2.5-2.5 MCG/ACT AERS NEW PRESCRIPTION REQUEST: STIOLTO 2.5 MCG- INHALE TWO PUFFS BY MOUTH DAILY 08/14/24   [name], [name], DO    Allergies: Patient has no known allergies.    Review of Systems  Updated Vital Signs BP 132/84   Pulse 78   Resp 16   SpO2 98%   Physical Exam Vitals and nursing note reviewed.  Constitutional:      General: He is not in acute distress.    Appearance: He is well-developed. He is not diaphoretic.  HENT:     Head: Normocephalic and atraumatic.  Eyes:     General: No scleral icterus.    Conjunctiva/sclera: Conjunctivae normal.  Cardiovascular:     Rate and Rhythm: Normal rate and regular rhythm.     Heart sounds: Normal heart sounds.  Pulmonary:     Effort: Pulmonary effort is normal. No respiratory distress.     Breath sounds: Normal breath sounds.  Chest:     Chest wall: Tenderness present.  Abdominal:     Palpations: Abdomen  is soft.     Tenderness: There is no abdominal tenderness.  Musculoskeletal:     Cervical back: Normal range of motion and neck supple.  Skin:    General: Skin is warm and dry.  Neurological:     Mental Status: He is alert.  Psychiatric:        Behavior: Behavior normal.     (all labs ordered are listed, but only abnormal results are displayed) Labs Reviewed  BASIC METABOLIC PANEL WITH GFR  CBC  TROPONIN T, HIGH SENSITIVITY    EKG: None  Radiology: No results found.   Procedures   Medications Ordered in the ED - No data to display                                  Medical Decision  Making Given the large differential diagnosis for Todd Estrada, the decision making in this case is of high complexity.  After evaluating all of the data points in this case, the presentation of Todd Estrada is NOT consistent with Acute Coronary Syndrome (ACS) and/or myocardial ischemia, pulmonary embolism, aortic dissection; Borhaave's, significant arrythmia, pneumothorax, cardiac tamponade, or other emergent cardiopulmonary condition.  Further, the presentation of Todd Estrada is NOT consistent with pericarditis, myocarditis, cholecystitis, pancreatitis, mediastinitis, endocarditis, new valvular disease.  Additionally, the presentation of Todd Estrada NOT consistent with flail chest, cardiac contusion, ARDS, or significant intra-thoracic or intra-abdominal bleeding.  Moreover, this presentation is NOT consistent with pneumonia, sepsis, or pyelonephritis.     Strict return and follow-up precautions have been given by me personally or by detailed written instruction given verbally by nursing staff using the teach back method to the patient/family/caregiver(s).  Data Reviewed/Counseling: I have reviewed the patient's vital signs, nursing notes, and other relevant tests/information. I had a detailed discussion regarding the historical points, exam findings, and any diagnostic results supporting the discharge diagnosis. I also discussed the need for outpatient follow-up and the need to return to the ED if symptoms worsen or if there are any questions or concerns that arise at home.    Amount and/or Complexity of Data Reviewed Labs: ordered. Decision-making details documented in ED Course.    Details: Labs reassuring troponin negative x 2 Radiology: ordered and independent interpretation performed.    Details: No acute findings on cxr ECG/medicine tests: ordered and independent interpretation performed.    Details: NSR , no sig changes from prior tracing  Risk OTC drugs. Prescription  drug management.        Final diagnoses:  Chest pain, unspecified type    ED Discharge Orders     None          Arloa Chroman, PA-C 01/07/25 1656    Garrick Charleston, MD 01/07/25 1714  "

## 2025-01-20 ENCOUNTER — Ambulatory Visit: Admitting: Student

## 2025-01-22 ENCOUNTER — Emergency Department (HOSPITAL_COMMUNITY)

## 2025-01-22 ENCOUNTER — Emergency Department (HOSPITAL_COMMUNITY)
Admission: EM | Admit: 2025-01-22 | Discharge: 2025-01-22 | Disposition: A | Discharge status: Home / Self Care | Attending: Emergency Medicine | Admitting: Emergency Medicine

## 2025-01-22 DIAGNOSIS — Z85118 Personal history of other malignant neoplasm of bronchus and lung: Secondary | ICD-10-CM | POA: Insufficient documentation

## 2025-01-22 DIAGNOSIS — Z8673 Personal history of transient ischemic attack (TIA), and cerebral infarction without residual deficits: Secondary | ICD-10-CM | POA: Insufficient documentation

## 2025-01-22 DIAGNOSIS — M545 Low back pain, unspecified: Secondary | ICD-10-CM | POA: Insufficient documentation

## 2025-01-22 LAB — CBC
HCT: 44.7 % (ref 39.0–52.0)
Hemoglobin: 14 g/dL (ref 13.0–17.0)
MCH: 29.2 pg (ref 26.0–34.0)
MCHC: 31.3 g/dL (ref 30.0–36.0)
MCV: 93.3 fL (ref 80.0–100.0)
Platelets: 154 10*3/uL (ref 150–400)
RBC: 4.79 MIL/uL (ref 4.22–5.81)
RDW: 13.6 % (ref 11.5–15.5)
WBC: 5.5 10*3/uL (ref 4.0–10.5)
nRBC: 0 % (ref 0.0–0.2)

## 2025-01-22 LAB — COMPREHENSIVE METABOLIC PANEL WITH GFR
ALT: 40 U/L (ref 0–44)
AST: 28 U/L (ref 15–41)
Albumin: 4.7 g/dL (ref 3.5–5.0)
Alkaline Phosphatase: 123 U/L (ref 38–126)
Anion gap: 11 (ref 5–15)
BUN: 15 mg/dL (ref 8–23)
CO2: 28 mmol/L (ref 22–32)
Calcium: 10.2 mg/dL (ref 8.9–10.3)
Chloride: 102 mmol/L (ref 98–111)
Creatinine, Ser: 0.97 mg/dL (ref 0.61–1.24)
GFR, Estimated: >60 mL/min
Glucose, Bld: 96 mg/dL (ref 70–99)
Potassium: 4.4 mmol/L (ref 3.5–5.1)
Sodium: 141 mmol/L (ref 135–145)
Total Bilirubin: 0.5 mg/dL (ref 0.0–1.2)
Total Protein: 8.7 g/dL — ABNORMAL HIGH (ref 6.5–8.1)

## 2025-01-22 LAB — LIPASE, BLOOD: Lipase: 22 U/L (ref 11–51)

## 2025-01-22 LAB — TROPONIN T, HIGH SENSITIVITY: Troponin T High Sensitivity: 9 ng/L (ref 0–19)

## 2025-01-22 MED ORDER — ONDANSETRON 4 MG PO TBDP
4.0000 mg | ORAL_TABLET | Freq: Once | ORAL | Status: AC
  Administered 2025-01-22: 4 mg via ORAL
  Filled 2025-01-22: qty 1

## 2025-01-22 MED ORDER — NAPROXEN 500 MG PO TABS
500.0000 mg | ORAL_TABLET | Freq: Once | ORAL | Status: AC
  Administered 2025-01-22: 500 mg via ORAL
  Filled 2025-01-22: qty 1

## 2025-01-22 MED ORDER — NAPROXEN 375 MG PO TABS: 375.0000 mg | ORAL_TABLET | Freq: Two times a day (BID) | ORAL | 0 refills | Status: AC

## 2025-01-22 NOTE — ED Triage Notes (Addendum)
 Patient points to back when asked what is hurting, when asked if it was the same pain he was seen for last time patient nodded head yes. Patient is alert and oriented x 4. Airway patent, respirations even and unlabored. Skin normal, warm and dry.

## 2025-01-22 NOTE — ED Provider Notes (Signed)
 " [place] EMERGENCY DEPARTMENT AT [facility] Provider Note   CSN: [id number] Arrival date & time: 01/22/25  1517     Patient presents with: Back Pain   Todd Estrada is a 64 y.o. male.  Patient with past history significant for left MCA stroke, lung cancer, emphysema, seizure disorder presents ED with concerns of back pain.  He is able to point and say that he is having pain towards his lower back.  Unclear how long his pain has been present for.  He denies any injury as far as I can tell.  No other symptoms that he can remark.  Patient has significant difficulty conversing and speaking.   Back Pain      Prior to Admission medications  Medication Sig Start Date End Date Taking? Authorizing Provider  naproxen  (NAPROSYN ) 375 MG tablet Take 1 tablet (375 mg total) by mouth 2 (two) times daily. 01/22/25  Yes Cierria Height A, PA-C  albuterol  (PROVENTIL ) (2.5 MG/3ML) 0.083% nebulizer solution Take 3 mLs (2.5 mg total) by nebulization every 4 (four) hours as needed for wheezing or shortness of breath. 08/16/22   Lou Claretta HERO, MD  atorvastatin  (LIPITOR) 40 MG tablet Take 1 tablet (40 mg total) by mouth daily. 08/14/24 08/14/25  [name], [name], DO  cephALEXin  (KEFLEX ) 500 MG capsule Take 1 capsule (500 mg total) by mouth 4 (four) times daily. 12/26/24   Odell Balls, PA-C  enoxaparin  (LOVENOX ) 100 MG/ML injection Inject 0.85 mLs (85 mg total) into the skin every 12 (twelve) hours. 11/18/24   Nguyen, Diana, MD  HYDROcodone -acetaminophen  (NORCO/VICODIN) 5-325 MG tablet Take 1 tablet by mouth every 6 (six) hours as needed for severe pain (pain score 7-10). 12/26/24   Odell Balls, PA-C  Multiple Vitamin (MULTIVITAMIN WITH MINERALS) TABS tablet Take 1 tablet by mouth daily.    [provider]  ondansetron  (ZOFRAN -ODT) 4 MG disintegrating tablet Take 1 tablet (4 mg total) by mouth every 8 (eight) hours as needed for nausea or vomiting. 10/17/24   Baron, Lauren E, PA   polyethylene glycol powder (MIRALAX ) 17 GM/SCOOP powder Take 17 g by mouth 2 (two) times daily as needed for mild constipation. 11/23/24   Gonfa, Taye T, MD  senna-docusate (SENOKOT-S) 8.6-50 MG tablet Take 1 tablet by mouth 2 (two) times daily between meals as needed for mild constipation. 11/23/24   Gonfa, Taye T, MD  sucralfate  (CARAFATE ) 1 g tablet TAKE 1 TABLET(1 GRAM) BY MOUTH FOUR TIMES DAILY( WITH MEALS AND AT BEDTIME) 09/19/24   Tobie Gaines, DO  Tiotropium Bromide-Olodaterol (STIOLTO RESPIMAT ) 2.5-2.5 MCG/ACT AERS NEW PRESCRIPTION REQUEST: STIOLTO 2.5 MCG- INHALE TWO PUFFS BY MOUTH DAILY 08/14/24   [name], [name], DO    Allergies: Patient has no known allergies.    Review of Systems  Musculoskeletal:  Positive for back pain.  All other systems reviewed and are negative.   Updated Vital Signs BP (!) 155/85   Pulse 85   Temp 98.7 F (37.1 C) (Oral)   Resp 18   SpO2 98%   Physical Exam Vitals and nursing note reviewed.  Constitutional:      General: He is not in acute distress.    Appearance: He is well-developed.  HENT:     Head: Normocephalic and atraumatic.  Eyes:     Conjunctiva/sclera: Conjunctivae normal.  Cardiovascular:     Rate and Rhythm: Normal rate and regular rhythm.     Heart sounds: No murmur heard. Pulmonary:     Effort: Pulmonary effort  is normal. No respiratory distress.     Breath sounds: Normal breath sounds.  Abdominal:     Palpations: Abdomen is soft.     Tenderness: There is no abdominal tenderness.  Musculoskeletal:        General: Tenderness present. No swelling, deformity or signs of injury.       Arms:     Cervical back: Neck supple.     Comments: TTP along the mid lumbar spine along with paraspinal tenderness.  Skin:    General: Skin is warm and dry.     Capillary Refill: Capillary refill takes less than 2 seconds.  Neurological:     Mental Status: He is alert.  Psychiatric:        Mood and Affect: Mood normal.     (all labs  ordered are listed, but only abnormal results are displayed) Labs Reviewed  COMPREHENSIVE METABOLIC PANEL WITH GFR - Abnormal; Notable for the following components:      Result Value   Total Protein 8.7 (*)    All other components within normal limits  CBC  LIPASE, BLOOD  TROPONIN T, HIGH SENSITIVITY    EKG: None  Radiology: DG Lumbar Spine Complete Result Date: 01/22/2025 EXAM: 4 or more VIEW(S) XRAY OF THE LUMBAR SPINE 01/22/2025 07:34:00 PM COMPARISON: Lumbar spine x ray 02/08/2023. CLINICAL HISTORY: Low back pain. FINDINGS: LUMBAR SPINE: BONES: Vertebral body heights are maintained. Alignment is normal. DISCS AND DEGENERATIVE CHANGES: Mild degenerative changes of the facet joints at L5-S1 similar to prior. SOFT TISSUES: There are atherosclerotic calcifications of the aorta. IMPRESSION: 1. Mild degenerative changes of the facet joints at L5-S1. 2. Atherosclerotic calcifications of the aorta. Electronically signed by: Greig Pique MD 01/22/2025 07:38 PM EST RP Workstation: [id number]   DG Chest Port 1 View Result Date: 01/22/2025 EXAM: 1 VIEW(S) XRAY OF THE CHEST 01/22/2025 04:45:00 PM COMPARISON: 01/07/2025 CLINICAL HISTORY: Chest pain. FINDINGS: LUNGS AND PLEURA: Similar fiducial marker about an area of irregular density in the right mid lung laterally. Unchanged left hilar density. No change from 01/07/2025. No pleural effusion. No pneumothorax. HEART AND MEDIASTINUM: No acute abnormality of the cardiac and mediastinal silhouettes. BONES AND SOFT TISSUES: No acute osseous abnormality. IMPRESSION: 1. No acute cardiopulmonary process. 2. Stable left hilar and right mid lung post treatment change versus malignancy. Electronically signed by: Norman Gatlin MD 01/22/2025 05:06 PM EST RP Workstation: [id number]     Procedures   Medications Ordered in the ED  ondansetron  (ZOFRAN -ODT) disintegrating tablet 4 mg (4 mg Oral Given 01/22/25 1733)  naproxen  (NAPROSYN ) tablet 500 mg (500 mg Oral Given  01/22/25 1931)                                    Medical Decision Making Amount and/or Complexity of Data Reviewed Radiology: ordered.  Risk Prescription drug management.   This patient presents to the ED for concern of back pain. Differential diagnosis includes lumbar strain, vertebral fracture, chronic pain, discitis    Additional history obtained:  Additional history obtained from chart review   Lab Tests:  I Ordered, and personally interpreted labs.  The pertinent results include: CBC unremarkable, CMP unremarkable, lipase negative at 22, troponin negative at 9.   Imaging Studies ordered:  I ordered imaging studies including chest x-ray, lumbar spine x-ray I independently visualized and interpreted imaging which showed no acute critical or process, stable left hilar and right midlung posttreatment changes  versus malignancy. I agree with the radiologist interpretation   Medicines ordered and prescription drug management:  I ordered medication including Zofran , naproxen  for nausea, pain Reevaluation of the patient after these medicines showed that the patient improved I have reviewed the patients home medicines and have made adjustments as needed   Problem List / ED Course:  Patient presents to the emergency department today with concerns of back pain.  Reportedly developed back pain for the last 4 days but could not attribute any specific injury or other mechanism to explain this pain.  Patient has a difficult time communicating but is able to point to his low back indicating area of pain and discomfort.  No other symptoms that he can report. On exam, there is some mid back and paraspinal tenderness.  No obvious step-off deformity.  Patient can freely range the back without difficulty. Basic labs obtained from triage shows unremarkable CBC, CMP, and lipase is negative.  Troponin negative at 9.  EKG unremarkable.  Chest x-ray shows no acute process.  There is a stable  left hilar and right midlung posttreatment changes versus malignancy.  Lumbar spine x-ray added on.  Naproxen  given for pain control. Lumbar spine xray negative for acute findings. Reassessed patient and he appears to have improved and is resting comfortably. Will discharge home with naproxen  sent to pharmacy. Return precautions discussed. Discharged home in stable condition.   Social Determinants of Health:  None  Final diagnoses:  Bilateral low back pain without sciatica, unspecified chronicity    ED Discharge Orders          Ordered    naproxen  (NAPROSYN ) 375 MG tablet  2 times daily        01/22/25 2005               Cecily Legrand DELENA DEVONNA 01/22/25 2008    Rogelia Jerilynn RAMAN, MD 01/22/25 2140  "

## 2025-01-22 NOTE — Discharge Instructions (Signed)
 You were seen in the ER today for concerns of back pain. Your labs and imaging was thankfully reassuring without any obvious or concerning findings. Please make sure that you are able to take Tylenol  or ibuprofen , for pain. I have sent naproxen  to your pharmacy. If you take this medication, do not combine it together with ibuprofen . For concerns of new or worsening symptoms, return to the ER.

## 2025-01-22 NOTE — ED Provider Triage Note (Signed)
 Emergency Medicine Provider Triage Evaluation Note  KATE SWEETMAN , a 64 y.o. male  was evaluated in triage.  Pt complains of pain in chest and back. Hx aphasia. Is able to answer yes to questions. Indicates pain across upper back with palpation. Appears to have emesis down his shirt although states no when asked if he got sick. Also indicated chest pain.   Review of Systems  Positive:  Negative:   Physical Exam  BP (!) 152/84 (BP Location: Left Arm)   Pulse 81   Temp 98.7 F (37.1 C) (Oral)   Resp 16   SpO2 99%  Gen:   Awake, no distress   Resp:  Normal effort  MSK:   Moves extremities without difficulty  Other:    Medical Decision Making  Medically screening exam initiated at 4:10 PM.  Appropriate orders placed.  [name] was informed that the remainder of the evaluation will be completed by another provider, this initial triage assessment does not replace that evaluation, and the importance of remaining in the ED until their evaluation is complete.     Beverley Leita LABOR, PA-C 01/22/25 [phone]

## 2025-01-22 NOTE — ED Notes (Signed)
"  X-ray to bedside  "

## 2025-03-10 ENCOUNTER — Ambulatory Visit: Payer: Self-pay

## 2025-04-28 ENCOUNTER — Inpatient Hospital Stay

## 2025-05-05 ENCOUNTER — Inpatient Hospital Stay: Admitting: Internal Medicine
